# Patient Record
Sex: Male | Born: 1935
Health system: Southern US, Community
[De-identification: ages and names within clinical notes are randomized; demographics above are authoritative.]

## PROBLEM LIST (undated history)

## (undated) DIAGNOSIS — I4819 Other persistent atrial fibrillation: Secondary | ICD-10-CM

## (undated) DIAGNOSIS — F32A Depression, unspecified: Secondary | ICD-10-CM

## (undated) DIAGNOSIS — Z860101 Personal history of adenomatous and serrated colon polyps: Secondary | ICD-10-CM

## (undated) DIAGNOSIS — I5030 Unspecified diastolic (congestive) heart failure: Secondary | ICD-10-CM

## (undated) DIAGNOSIS — F329 Major depressive disorder, single episode, unspecified: Secondary | ICD-10-CM

## (undated) DIAGNOSIS — C4491 Basal cell carcinoma of skin, unspecified: Secondary | ICD-10-CM

## (undated) DIAGNOSIS — F419 Anxiety disorder, unspecified: Secondary | ICD-10-CM

## (undated) DIAGNOSIS — Z8639 Personal history of other endocrine, nutritional and metabolic disease: Secondary | ICD-10-CM

## (undated) DIAGNOSIS — L280 Lichen simplex chronicus: Secondary | ICD-10-CM

## (undated) DIAGNOSIS — N2889 Other specified disorders of kidney and ureter: Secondary | ICD-10-CM

## (undated) DIAGNOSIS — I2510 Atherosclerotic heart disease of native coronary artery without angina pectoris: Secondary | ICD-10-CM

## (undated) DIAGNOSIS — D649 Anemia, unspecified: Secondary | ICD-10-CM

## (undated) DIAGNOSIS — N319 Neuromuscular dysfunction of bladder, unspecified: Secondary | ICD-10-CM

## (undated) DIAGNOSIS — N189 Chronic kidney disease, unspecified: Secondary | ICD-10-CM

## (undated) DIAGNOSIS — N289 Disorder of kidney and ureter, unspecified: Secondary | ICD-10-CM

## (undated) DIAGNOSIS — K5792 Diverticulitis of intestine, part unspecified, without perforation or abscess without bleeding: Secondary | ICD-10-CM

## (undated) DIAGNOSIS — E871 Hypo-osmolality and hyponatremia: Secondary | ICD-10-CM

## (undated) DIAGNOSIS — I10 Essential (primary) hypertension: Secondary | ICD-10-CM

## (undated) DIAGNOSIS — C61 Malignant neoplasm of prostate: Secondary | ICD-10-CM

## (undated) DIAGNOSIS — C801 Malignant (primary) neoplasm, unspecified: Secondary | ICD-10-CM

## (undated) DIAGNOSIS — N529 Male erectile dysfunction, unspecified: Secondary | ICD-10-CM

## (undated) DIAGNOSIS — K219 Gastro-esophageal reflux disease without esophagitis: Secondary | ICD-10-CM

## (undated) DIAGNOSIS — I499 Cardiac arrhythmia, unspecified: Secondary | ICD-10-CM

## (undated) DIAGNOSIS — K589 Irritable bowel syndrome without diarrhea: Secondary | ICD-10-CM

## (undated) DIAGNOSIS — G4733 Obstructive sleep apnea (adult) (pediatric): Secondary | ICD-10-CM

## (undated) DIAGNOSIS — I509 Heart failure, unspecified: Secondary | ICD-10-CM

## (undated) DIAGNOSIS — M1990 Unspecified osteoarthritis, unspecified site: Secondary | ICD-10-CM

## (undated) DIAGNOSIS — M359 Systemic involvement of connective tissue, unspecified: Secondary | ICD-10-CM

## (undated) DIAGNOSIS — T7840XA Allergy, unspecified, initial encounter: Secondary | ICD-10-CM

## (undated) DIAGNOSIS — Z86010 Personal history of colonic polyps: Secondary | ICD-10-CM

## (undated) HISTORY — DX: Disorder of kidney and ureter, unspecified: N28.9

## (undated) HISTORY — DX: Irritable bowel syndrome, unspecified: K58.9

## (undated) HISTORY — DX: Diverticulitis of intestine, part unspecified, without perforation or abscess without bleeding: K57.92

## (undated) HISTORY — DX: Unspecified diastolic (congestive) heart failure: I50.30

## (undated) HISTORY — DX: Other persistent atrial fibrillation: I48.19

## (undated) HISTORY — DX: Depression, unspecified: F32.A

## (undated) HISTORY — DX: Lichen simplex chronicus: L28.0

## (undated) HISTORY — DX: Obstructive sleep apnea (adult) (pediatric): G47.33

## (undated) HISTORY — DX: Other specified disorders of kidney and ureter: N28.89

## (undated) HISTORY — DX: Major depressive disorder, single episode, unspecified: F32.9

## (undated) HISTORY — DX: Personal history of other endocrine, nutritional and metabolic disease: Z86.39

## (undated) HISTORY — DX: Essential (primary) hypertension: I10

## (undated) HISTORY — DX: Unspecified osteoarthritis, unspecified site: M19.90

## (undated) HISTORY — DX: Neuromuscular dysfunction of bladder, unspecified: N31.9

## (undated) HISTORY — DX: Malignant (primary) neoplasm, unspecified: C80.1

## (undated) HISTORY — DX: Basal cell carcinoma of skin, unspecified: C44.91

## (undated) HISTORY — PX: PROSTATE SURGERY: SHX751

## (undated) HISTORY — PX: OTHER SURGICAL HISTORY: SHX169

## (undated) HISTORY — DX: Atherosclerotic heart disease of native coronary artery without angina pectoris: I25.10

## (undated) HISTORY — DX: Allergy, unspecified, initial encounter: T78.40XA

## (undated) HISTORY — DX: Personal history of colonic polyps: Z86.010

## (undated) HISTORY — DX: Hypo-osmolality and hyponatremia: E87.1

## (undated) HISTORY — DX: Personal history of adenomatous and serrated colon polyps: Z86.0101

## (undated) HISTORY — DX: Male erectile dysfunction, unspecified: N52.9

## (undated) HISTORY — DX: Anxiety disorder, unspecified: F41.9

## (undated) HISTORY — DX: Gastro-esophageal reflux disease without esophagitis: K21.9

---

## 1991-11-29 DIAGNOSIS — C61 Malignant neoplasm of prostate: Secondary | ICD-10-CM | POA: Insufficient documentation

## 2003-10-28 ENCOUNTER — Encounter: Payer: Self-pay | Admitting: Gastroenterology

## 2004-10-20 ENCOUNTER — Ambulatory Visit: Payer: Self-pay | Admitting: Internal Medicine

## 2004-11-10 ENCOUNTER — Ambulatory Visit: Payer: Self-pay | Admitting: Internal Medicine

## 2005-02-24 ENCOUNTER — Ambulatory Visit: Payer: Self-pay | Admitting: Internal Medicine

## 2005-04-14 ENCOUNTER — Ambulatory Visit: Payer: Self-pay | Admitting: Internal Medicine

## 2005-04-27 ENCOUNTER — Ambulatory Visit: Payer: Self-pay | Admitting: Internal Medicine

## 2005-05-20 ENCOUNTER — Ambulatory Visit: Payer: Self-pay | Admitting: Internal Medicine

## 2005-08-11 ENCOUNTER — Ambulatory Visit: Payer: Self-pay | Admitting: Internal Medicine

## 2005-09-01 ENCOUNTER — Ambulatory Visit: Payer: Self-pay | Admitting: Dermatology

## 2005-09-07 ENCOUNTER — Ambulatory Visit: Payer: Self-pay | Admitting: Internal Medicine

## 2005-10-24 ENCOUNTER — Ambulatory Visit: Payer: Self-pay | Admitting: Internal Medicine

## 2005-11-09 ENCOUNTER — Ambulatory Visit: Payer: Self-pay | Admitting: Internal Medicine

## 2005-11-11 ENCOUNTER — Ambulatory Visit: Payer: Self-pay | Admitting: Cardiology

## 2006-01-30 ENCOUNTER — Ambulatory Visit: Payer: Self-pay | Admitting: Internal Medicine

## 2006-02-08 ENCOUNTER — Ambulatory Visit: Payer: Self-pay | Admitting: Internal Medicine

## 2006-03-27 ENCOUNTER — Ambulatory Visit: Payer: Self-pay | Admitting: Internal Medicine

## 2006-04-04 ENCOUNTER — Ambulatory Visit: Payer: Self-pay | Admitting: Urology

## 2006-05-03 ENCOUNTER — Ambulatory Visit: Payer: Self-pay | Admitting: Internal Medicine

## 2006-06-01 ENCOUNTER — Ambulatory Visit: Payer: Self-pay | Admitting: Family Medicine

## 2006-06-06 ENCOUNTER — Ambulatory Visit: Payer: Self-pay | Admitting: Internal Medicine

## 2006-06-22 ENCOUNTER — Ambulatory Visit: Payer: Self-pay | Admitting: Internal Medicine

## 2006-07-05 ENCOUNTER — Ambulatory Visit: Payer: Self-pay | Admitting: Psychology

## 2006-07-17 ENCOUNTER — Ambulatory Visit: Payer: Self-pay | Admitting: Psychology

## 2006-07-19 ENCOUNTER — Ambulatory Visit: Payer: Self-pay

## 2006-07-19 ENCOUNTER — Encounter: Payer: Self-pay | Admitting: Internal Medicine

## 2006-08-03 ENCOUNTER — Ambulatory Visit: Payer: Self-pay | Admitting: Internal Medicine

## 2006-08-04 ENCOUNTER — Ambulatory Visit: Payer: Self-pay

## 2006-08-15 ENCOUNTER — Ambulatory Visit: Payer: Self-pay | Admitting: Internal Medicine

## 2006-08-18 ENCOUNTER — Ambulatory Visit: Payer: Self-pay | Admitting: Internal Medicine

## 2006-08-22 ENCOUNTER — Ambulatory Visit: Payer: Self-pay | Admitting: Internal Medicine

## 2006-08-30 ENCOUNTER — Ambulatory Visit: Payer: Self-pay | Admitting: Internal Medicine

## 2006-09-05 ENCOUNTER — Ambulatory Visit: Payer: Self-pay | Admitting: Psychology

## 2006-09-13 ENCOUNTER — Ambulatory Visit: Payer: Self-pay | Admitting: Internal Medicine

## 2006-09-18 ENCOUNTER — Ambulatory Visit: Payer: Self-pay | Admitting: Gastroenterology

## 2006-09-20 ENCOUNTER — Encounter: Payer: Self-pay | Admitting: Gastroenterology

## 2006-09-20 ENCOUNTER — Ambulatory Visit: Payer: Self-pay | Admitting: *Deleted

## 2006-09-22 ENCOUNTER — Ambulatory Visit: Payer: Self-pay | Admitting: Internal Medicine

## 2006-09-29 ENCOUNTER — Ambulatory Visit: Payer: Self-pay | Admitting: Gastroenterology

## 2006-10-05 ENCOUNTER — Ambulatory Visit: Payer: Self-pay | Admitting: Gastroenterology

## 2006-10-05 LAB — CONVERTED CEMR LAB
Alkaline Phosphatase: 44 units/L (ref 39–117)
BUN: 6 mg/dL (ref 6–23)
CO2: 31 meq/L (ref 19–32)
Calcium: 9.2 mg/dL (ref 8.4–10.5)
Creatinine, Ser: 0.7 mg/dL (ref 0.4–1.5)
Eosinophil percent: 2.5 % (ref 0.0–5.0)
GFR calc non Af Amer: 118 mL/min
Glomerular Filtration Rate, Af Am: 143 mL/min/{1.73_m2}
Hemoglobin: 15.7 g/dL (ref 13.0–17.0)
Lipase: 40 units/L (ref 11.0–59.0)
Lymphocytes Relative: 12.5 % (ref 12.0–46.0)
Monocytes Absolute: 0.5 10*3/uL (ref 0.2–0.7)
Monocytes Relative: 7.4 % (ref 3.0–11.0)
Neutro Abs: 5.4 10*3/uL (ref 1.4–7.7)
Potassium: 4.6 meq/L (ref 3.5–5.1)
Total Protein: 6.4 g/dL (ref 6.0–8.3)

## 2006-10-12 ENCOUNTER — Ambulatory Visit: Payer: Self-pay | Admitting: Internal Medicine

## 2006-10-13 ENCOUNTER — Ambulatory Visit: Payer: Self-pay | Admitting: Psychology

## 2006-10-17 ENCOUNTER — Ambulatory Visit: Payer: Self-pay | Admitting: Internal Medicine

## 2006-10-26 ENCOUNTER — Ambulatory Visit: Payer: Self-pay | Admitting: Internal Medicine

## 2006-10-31 ENCOUNTER — Ambulatory Visit: Payer: Self-pay | Admitting: Psychology

## 2006-11-13 ENCOUNTER — Ambulatory Visit: Payer: Self-pay | Admitting: Internal Medicine

## 2006-11-14 ENCOUNTER — Ambulatory Visit: Payer: Self-pay | Admitting: Psychology

## 2006-12-12 ENCOUNTER — Ambulatory Visit: Payer: Self-pay | Admitting: Psychology

## 2007-01-09 ENCOUNTER — Ambulatory Visit: Payer: Self-pay | Admitting: Psychology

## 2007-01-10 ENCOUNTER — Ambulatory Visit: Payer: Self-pay | Admitting: Internal Medicine

## 2007-01-27 DIAGNOSIS — M0579 Rheumatoid arthritis with rheumatoid factor of multiple sites without organ or systems involvement: Secondary | ICD-10-CM | POA: Insufficient documentation

## 2007-01-27 DIAGNOSIS — F411 Generalized anxiety disorder: Secondary | ICD-10-CM

## 2007-01-27 DIAGNOSIS — F419 Anxiety disorder, unspecified: Secondary | ICD-10-CM | POA: Insufficient documentation

## 2007-01-27 DIAGNOSIS — J45909 Unspecified asthma, uncomplicated: Secondary | ICD-10-CM

## 2007-01-27 DIAGNOSIS — K219 Gastro-esophageal reflux disease without esophagitis: Secondary | ICD-10-CM | POA: Insufficient documentation

## 2007-01-27 DIAGNOSIS — N529 Male erectile dysfunction, unspecified: Secondary | ICD-10-CM | POA: Insufficient documentation

## 2007-01-27 DIAGNOSIS — M069 Rheumatoid arthritis, unspecified: Secondary | ICD-10-CM | POA: Insufficient documentation

## 2007-01-27 DIAGNOSIS — K5730 Diverticulosis of large intestine without perforation or abscess without bleeding: Secondary | ICD-10-CM | POA: Insufficient documentation

## 2007-01-27 DIAGNOSIS — I2510 Atherosclerotic heart disease of native coronary artery without angina pectoris: Secondary | ICD-10-CM

## 2007-01-27 DIAGNOSIS — L280 Lichen simplex chronicus: Secondary | ICD-10-CM | POA: Insufficient documentation

## 2007-01-30 ENCOUNTER — Ambulatory Visit: Payer: Self-pay | Admitting: Internal Medicine

## 2007-01-30 LAB — CONVERTED CEMR LAB
BUN: 13 mg/dL (ref 6–23)
Basophils Relative: 0.2 % (ref 0.0–1.0)
Bilirubin, Direct: 0.1 mg/dL (ref 0.0–0.3)
Calcium: 8.9 mg/dL (ref 8.4–10.5)
Eosinophils Relative: 6.2 % — ABNORMAL HIGH (ref 0.0–5.0)
GFR calc Af Amer: 143 mL/min
GFR calc non Af Amer: 118 mL/min
Glucose, Bld: 91 mg/dL (ref 70–99)
Hemoglobin: 15.5 g/dL (ref 13.0–17.0)
Lymphocytes Relative: 22.8 % (ref 12.0–46.0)
MCV: 94.1 fL (ref 78.0–100.0)
Monocytes Absolute: 0.6 10*3/uL (ref 0.2–0.7)
Neutro Abs: 3.4 10*3/uL (ref 1.4–7.7)
Neutrophils Relative %: 61.1 % (ref 43.0–77.0)
Potassium: 4.4 meq/L (ref 3.5–5.1)
Total Bilirubin: 0.9 mg/dL (ref 0.3–1.2)
Total Protein: 6.4 g/dL (ref 6.0–8.3)
VLDL: 15 mg/dL (ref 0–40)
WBC: 5.7 10*3/uL (ref 4.5–10.5)

## 2007-02-21 ENCOUNTER — Encounter: Payer: Self-pay | Admitting: Internal Medicine

## 2007-02-23 ENCOUNTER — Encounter: Payer: Self-pay | Admitting: Internal Medicine

## 2007-03-06 ENCOUNTER — Ambulatory Visit: Payer: Self-pay | Admitting: Psychology

## 2007-05-01 ENCOUNTER — Ambulatory Visit: Payer: Self-pay | Admitting: Psychology

## 2007-06-05 ENCOUNTER — Ambulatory Visit: Payer: Self-pay | Admitting: Internal Medicine

## 2007-06-13 ENCOUNTER — Ambulatory Visit: Payer: Self-pay | Admitting: Internal Medicine

## 2007-07-09 ENCOUNTER — Telehealth (INDEPENDENT_AMBULATORY_CARE_PROVIDER_SITE_OTHER): Payer: Self-pay | Admitting: *Deleted

## 2007-07-10 ENCOUNTER — Telehealth (INDEPENDENT_AMBULATORY_CARE_PROVIDER_SITE_OTHER): Payer: Self-pay | Admitting: *Deleted

## 2007-09-07 ENCOUNTER — Ambulatory Visit: Payer: Self-pay | Admitting: Internal Medicine

## 2007-09-19 ENCOUNTER — Ambulatory Visit: Payer: Self-pay | Admitting: Internal Medicine

## 2007-09-20 LAB — CONVERTED CEMR LAB
ALT: 28 units/L (ref 0–53)
Alkaline Phosphatase: 46 units/L (ref 39–117)
Basophils Absolute: 0 10*3/uL (ref 0.0–0.1)
Chloride: 102 meq/L (ref 96–112)
Creatinine, Ser: 0.7 mg/dL (ref 0.4–1.5)
Eosinophils Absolute: 0.3 10*3/uL (ref 0.0–0.6)
Eosinophils Relative: 5.6 % — ABNORMAL HIGH (ref 0.0–5.0)
Glucose, Bld: 100 mg/dL — ABNORMAL HIGH (ref 70–99)
MCV: 94.4 fL (ref 78.0–100.0)
Platelets: 147 10*3/uL — ABNORMAL LOW (ref 150–400)
RBC: 4.6 M/uL (ref 4.22–5.81)
Sodium: 138 meq/L (ref 135–145)
Total Protein: 6.3 g/dL (ref 6.0–8.3)
WBC: 5.7 10*3/uL (ref 4.5–10.5)

## 2007-10-10 ENCOUNTER — Ambulatory Visit: Payer: Self-pay | Admitting: Internal Medicine

## 2007-10-23 ENCOUNTER — Ambulatory Visit: Payer: Self-pay | Admitting: Internal Medicine

## 2007-10-24 ENCOUNTER — Telehealth (INDEPENDENT_AMBULATORY_CARE_PROVIDER_SITE_OTHER): Payer: Self-pay | Admitting: Internal Medicine

## 2007-10-30 ENCOUNTER — Telehealth (INDEPENDENT_AMBULATORY_CARE_PROVIDER_SITE_OTHER): Payer: Self-pay | Admitting: *Deleted

## 2007-10-31 ENCOUNTER — Telehealth (INDEPENDENT_AMBULATORY_CARE_PROVIDER_SITE_OTHER): Payer: Self-pay | Admitting: *Deleted

## 2007-11-02 ENCOUNTER — Ambulatory Visit: Payer: Self-pay | Admitting: Internal Medicine

## 2007-11-08 ENCOUNTER — Ambulatory Visit: Payer: Self-pay | Admitting: Internal Medicine

## 2007-11-29 HISTORY — PX: NASAL SINUS SURGERY: SHX719

## 2007-12-03 ENCOUNTER — Other Ambulatory Visit: Payer: Self-pay

## 2007-12-03 ENCOUNTER — Inpatient Hospital Stay: Payer: Self-pay | Admitting: *Deleted

## 2007-12-06 ENCOUNTER — Encounter: Payer: Self-pay | Admitting: Internal Medicine

## 2007-12-10 ENCOUNTER — Ambulatory Visit: Payer: Self-pay | Admitting: Internal Medicine

## 2007-12-19 ENCOUNTER — Telehealth (INDEPENDENT_AMBULATORY_CARE_PROVIDER_SITE_OTHER): Payer: Self-pay | Admitting: *Deleted

## 2007-12-24 ENCOUNTER — Ambulatory Visit: Payer: Self-pay | Admitting: Internal Medicine

## 2007-12-24 ENCOUNTER — Telehealth (INDEPENDENT_AMBULATORY_CARE_PROVIDER_SITE_OTHER): Payer: Self-pay | Admitting: *Deleted

## 2008-01-04 ENCOUNTER — Telehealth (INDEPENDENT_AMBULATORY_CARE_PROVIDER_SITE_OTHER): Payer: Self-pay | Admitting: *Deleted

## 2008-01-11 ENCOUNTER — Ambulatory Visit: Payer: Self-pay | Admitting: Internal Medicine

## 2008-01-29 ENCOUNTER — Telehealth (INDEPENDENT_AMBULATORY_CARE_PROVIDER_SITE_OTHER): Payer: Self-pay | Admitting: *Deleted

## 2008-02-15 ENCOUNTER — Ambulatory Visit: Payer: Self-pay | Admitting: Internal Medicine

## 2008-02-15 DIAGNOSIS — M1710 Unilateral primary osteoarthritis, unspecified knee: Secondary | ICD-10-CM | POA: Insufficient documentation

## 2008-02-20 ENCOUNTER — Ambulatory Visit: Payer: Self-pay | Admitting: Internal Medicine

## 2008-02-21 LAB — CONVERTED CEMR LAB
AST: 20 units/L (ref 0–37)
Alkaline Phosphatase: 47 units/L (ref 39–117)
Basophils Absolute: 0 10*3/uL (ref 0.0–0.1)
CO2: 30 meq/L (ref 19–32)
Calcium: 9 mg/dL (ref 8.4–10.5)
Chloride: 102 meq/L (ref 96–112)
Cholesterol: 158 mg/dL (ref 0–200)
Creatinine, Ser: 0.7 mg/dL (ref 0.4–1.5)
Glucose, Bld: 93 mg/dL (ref 70–99)
HDL: 46 mg/dL (ref >39.0)
LDL Cholesterol: 89 mg/dL (ref 0–99)
Lymphocytes Relative: 22.6 % (ref 12.0–46.0)
MCHC: 33.5 g/dL (ref 30.0–36.0)
Neutrophils Relative %: 64.4 % (ref 43.0–77.0)
PSA: 0.01 ng/mL — ABNORMAL LOW (ref 0.10–4.00)
RDW: 11.8 % (ref 11.5–14.6)
Sodium: 135 meq/L (ref 135–145)
Total Bilirubin: 1.1 mg/dL (ref 0.3–1.2)
Triglycerides: 115 mg/dL (ref 0–149)
VLDL: 23 mg/dL (ref 0–40)

## 2008-04-09 ENCOUNTER — Ambulatory Visit: Payer: Self-pay | Admitting: Internal Medicine

## 2008-04-24 ENCOUNTER — Telehealth: Payer: Self-pay | Admitting: Internal Medicine

## 2008-05-01 ENCOUNTER — Ambulatory Visit: Payer: Self-pay | Admitting: Internal Medicine

## 2008-05-02 ENCOUNTER — Encounter: Payer: Self-pay | Admitting: Internal Medicine

## 2008-05-02 ENCOUNTER — Emergency Department: Payer: Self-pay | Admitting: Emergency Medicine

## 2008-05-02 ENCOUNTER — Telehealth (INDEPENDENT_AMBULATORY_CARE_PROVIDER_SITE_OTHER): Payer: Self-pay | Admitting: *Deleted

## 2008-05-05 ENCOUNTER — Ambulatory Visit: Payer: Self-pay | Admitting: Internal Medicine

## 2008-05-05 ENCOUNTER — Telehealth (INDEPENDENT_AMBULATORY_CARE_PROVIDER_SITE_OTHER): Payer: Self-pay | Admitting: *Deleted

## 2008-05-06 ENCOUNTER — Telehealth: Payer: Self-pay | Admitting: Internal Medicine

## 2008-05-06 ENCOUNTER — Encounter (INDEPENDENT_AMBULATORY_CARE_PROVIDER_SITE_OTHER): Payer: Self-pay | Admitting: *Deleted

## 2008-05-08 ENCOUNTER — Telehealth: Payer: Self-pay | Admitting: Family Medicine

## 2008-05-08 ENCOUNTER — Encounter (INDEPENDENT_AMBULATORY_CARE_PROVIDER_SITE_OTHER): Payer: Self-pay | Admitting: *Deleted

## 2008-05-12 ENCOUNTER — Ambulatory Visit: Payer: Self-pay | Admitting: Pulmonary Disease

## 2008-05-19 ENCOUNTER — Telehealth: Payer: Self-pay | Admitting: Internal Medicine

## 2008-05-28 ENCOUNTER — Telehealth: Payer: Self-pay | Admitting: Family Medicine

## 2008-06-02 ENCOUNTER — Ambulatory Visit: Payer: Self-pay | Admitting: Internal Medicine

## 2008-06-05 ENCOUNTER — Encounter: Payer: Self-pay | Admitting: Pulmonary Disease

## 2008-06-05 ENCOUNTER — Ambulatory Visit: Payer: Self-pay | Admitting: Pulmonary Disease

## 2008-06-09 ENCOUNTER — Encounter: Payer: Self-pay | Admitting: Pulmonary Disease

## 2008-06-12 ENCOUNTER — Encounter: Payer: Self-pay | Admitting: Internal Medicine

## 2008-06-16 ENCOUNTER — Ambulatory Visit: Payer: Self-pay | Admitting: Internal Medicine

## 2008-06-30 ENCOUNTER — Telehealth (INDEPENDENT_AMBULATORY_CARE_PROVIDER_SITE_OTHER): Payer: Self-pay | Admitting: *Deleted

## 2008-07-08 ENCOUNTER — Ambulatory Visit: Payer: Self-pay | Admitting: Unknown Physician Specialty

## 2008-07-24 ENCOUNTER — Encounter: Payer: Self-pay | Admitting: Internal Medicine

## 2008-08-06 ENCOUNTER — Ambulatory Visit: Payer: Self-pay | Admitting: Internal Medicine

## 2008-08-08 ENCOUNTER — Telehealth: Payer: Self-pay | Admitting: Internal Medicine

## 2008-08-21 ENCOUNTER — Ambulatory Visit: Payer: Self-pay | Admitting: Internal Medicine

## 2008-08-22 ENCOUNTER — Ambulatory Visit: Payer: Self-pay | Admitting: Internal Medicine

## 2008-08-22 ENCOUNTER — Telehealth: Payer: Self-pay | Admitting: Internal Medicine

## 2008-09-02 ENCOUNTER — Ambulatory Visit: Payer: Self-pay | Admitting: Pulmonary Disease

## 2008-09-10 ENCOUNTER — Ambulatory Visit: Payer: Self-pay | Admitting: Internal Medicine

## 2008-09-11 ENCOUNTER — Telehealth: Payer: Self-pay | Admitting: Internal Medicine

## 2008-10-10 ENCOUNTER — Telehealth: Payer: Self-pay | Admitting: Internal Medicine

## 2008-10-13 ENCOUNTER — Telehealth (INDEPENDENT_AMBULATORY_CARE_PROVIDER_SITE_OTHER): Payer: Self-pay | Admitting: *Deleted

## 2008-10-15 ENCOUNTER — Ambulatory Visit: Payer: Self-pay | Admitting: Internal Medicine

## 2008-11-11 ENCOUNTER — Ambulatory Visit: Payer: Self-pay | Admitting: Internal Medicine

## 2008-11-17 ENCOUNTER — Ambulatory Visit: Payer: Self-pay | Admitting: Internal Medicine

## 2008-11-17 LAB — CONVERTED CEMR LAB
BUN: 9 mg/dL (ref 6–23)
Basophils Absolute: 0 10*3/uL (ref 0.0–0.1)
Bilirubin, Direct: 0.1 mg/dL (ref 0.0–0.3)
CO2: 30 meq/L (ref 19–32)
Chloride: 97 meq/L (ref 96–112)
Cholesterol: 172 mg/dL (ref 0–200)
Eosinophils Absolute: 0.8 10*3/uL — ABNORMAL HIGH (ref 0.0–0.7)
Glucose, Bld: 95 mg/dL (ref 70–99)
HCT: 40.7 % (ref 39.0–52.0)
HDL: 65.8 mg/dL (ref >39.0)
MCHC: 35.3 g/dL (ref 30.0–36.0)
MCV: 91.8 fL (ref 78.0–100.0)
Monocytes Absolute: 0.7 10*3/uL (ref 0.1–1.0)
Monocytes Relative: 8.7 % (ref 3.0–12.0)
Neutro Abs: 5.6 10*3/uL (ref 1.4–7.7)
PSA: 0.04 ng/mL — ABNORMAL LOW (ref 0.10–4.00)
Platelets: 191 10*3/uL (ref 150–400)
Potassium: 4.7 meq/L (ref 3.5–5.1)
RDW: 13.1 % (ref 11.5–14.6)
Sodium: 134 meq/L — ABNORMAL LOW (ref 135–145)
Total Bilirubin: 1.1 mg/dL (ref 0.3–1.2)
Total CHOL/HDL Ratio: 2.6
Triglycerides: 75 mg/dL (ref 0–149)

## 2008-11-25 ENCOUNTER — Telehealth: Payer: Self-pay | Admitting: Internal Medicine

## 2008-12-03 ENCOUNTER — Emergency Department: Payer: Self-pay | Admitting: Emergency Medicine

## 2008-12-03 ENCOUNTER — Encounter: Payer: Self-pay | Admitting: Pulmonary Disease

## 2008-12-09 ENCOUNTER — Ambulatory Visit: Payer: Self-pay | Admitting: Pulmonary Disease

## 2008-12-09 DIAGNOSIS — J309 Allergic rhinitis, unspecified: Secondary | ICD-10-CM | POA: Insufficient documentation

## 2008-12-26 ENCOUNTER — Telehealth: Payer: Self-pay | Admitting: Internal Medicine

## 2008-12-30 ENCOUNTER — Ambulatory Visit: Payer: Self-pay | Admitting: Internal Medicine

## 2008-12-31 ENCOUNTER — Telehealth: Payer: Self-pay | Admitting: Internal Medicine

## 2009-01-07 ENCOUNTER — Telehealth: Payer: Self-pay | Admitting: Internal Medicine

## 2009-01-08 ENCOUNTER — Ambulatory Visit: Payer: Self-pay | Admitting: Internal Medicine

## 2009-01-20 ENCOUNTER — Ambulatory Visit: Payer: Self-pay | Admitting: Pulmonary Disease

## 2009-01-27 ENCOUNTER — Telehealth (INDEPENDENT_AMBULATORY_CARE_PROVIDER_SITE_OTHER): Payer: Self-pay | Admitting: *Deleted

## 2009-02-12 ENCOUNTER — Ambulatory Visit: Payer: Self-pay | Admitting: Internal Medicine

## 2009-03-02 ENCOUNTER — Inpatient Hospital Stay: Payer: Self-pay | Admitting: Internal Medicine

## 2009-03-02 ENCOUNTER — Encounter: Payer: Self-pay | Admitting: Pulmonary Disease

## 2009-03-03 ENCOUNTER — Telehealth: Payer: Self-pay | Admitting: Internal Medicine

## 2009-03-04 ENCOUNTER — Encounter: Payer: Self-pay | Admitting: Internal Medicine

## 2009-03-06 ENCOUNTER — Ambulatory Visit: Payer: Self-pay | Admitting: Internal Medicine

## 2009-03-16 ENCOUNTER — Ambulatory Visit: Payer: Self-pay | Admitting: Internal Medicine

## 2009-03-18 ENCOUNTER — Encounter: Payer: Self-pay | Admitting: Internal Medicine

## 2009-03-19 LAB — CONVERTED CEMR LAB
ALT: 27 units/L (ref 0–53)
AST: 29 units/L (ref 0–37)
Basophils Relative: 0.1 % (ref 0.0–3.0)
Bilirubin, Direct: 0.1 mg/dL (ref 0.0–0.3)
CO2: 32 meq/L (ref 19–32)
Chloride: 101 meq/L (ref 96–112)
Eosinophils Relative: 7.2 % — ABNORMAL HIGH (ref 0.0–5.0)
HCT: 41.5 % (ref 39.0–52.0)
LDL Cholesterol: 86 mg/dL (ref 0–99)
Lymphs Abs: 1.5 10*3/uL (ref 0.7–4.0)
MCV: 91.9 fL (ref 78.0–100.0)
Monocytes Absolute: 0.6 10*3/uL (ref 0.1–1.0)
Monocytes Relative: 6.6 % (ref 3.0–12.0)
Neutrophils Relative %: 69.9 % (ref 43.0–77.0)
PSA: 0.03 ng/mL — ABNORMAL LOW (ref 0.10–4.00)
Phosphorus: 3.9 mg/dL (ref 2.3–4.6)
Platelets: 176 10*3/uL (ref 150.0–400.0)
RBC: 4.51 M/uL (ref 4.22–5.81)
Sodium: 136 meq/L (ref 135–145)
Total Bilirubin: 0.7 mg/dL (ref 0.3–1.2)
Total CHOL/HDL Ratio: 3
Total Protein: 6.4 g/dL (ref 6.0–8.3)
Triglycerides: 55 mg/dL (ref 0.0–149.0)
WBC: 9.5 10*3/uL (ref 4.5–10.5)

## 2009-03-21 ENCOUNTER — Inpatient Hospital Stay: Payer: Self-pay | Admitting: Internal Medicine

## 2009-03-21 ENCOUNTER — Ambulatory Visit: Payer: Self-pay | Admitting: Cardiology

## 2009-03-21 ENCOUNTER — Encounter: Payer: Self-pay | Admitting: Pulmonary Disease

## 2009-03-21 ENCOUNTER — Encounter: Payer: Self-pay | Admitting: Internal Medicine

## 2009-03-23 ENCOUNTER — Encounter: Payer: Self-pay | Admitting: Internal Medicine

## 2009-03-25 ENCOUNTER — Ambulatory Visit: Payer: Self-pay | Admitting: Internal Medicine

## 2009-03-25 DIAGNOSIS — I10 Essential (primary) hypertension: Secondary | ICD-10-CM | POA: Insufficient documentation

## 2009-04-06 ENCOUNTER — Telehealth: Payer: Self-pay | Admitting: Internal Medicine

## 2009-04-13 ENCOUNTER — Encounter: Payer: Self-pay | Admitting: Cardiovascular Disease

## 2009-04-13 ENCOUNTER — Inpatient Hospital Stay: Payer: Self-pay | Admitting: Internal Medicine

## 2009-04-13 ENCOUNTER — Encounter: Payer: Self-pay | Admitting: Internal Medicine

## 2009-04-13 ENCOUNTER — Encounter: Payer: Self-pay | Admitting: Pulmonary Disease

## 2009-04-15 ENCOUNTER — Telehealth: Payer: Self-pay | Admitting: Internal Medicine

## 2009-04-16 ENCOUNTER — Encounter: Payer: Self-pay | Admitting: Pulmonary Disease

## 2009-04-21 ENCOUNTER — Encounter: Payer: Self-pay | Admitting: Internal Medicine

## 2009-04-21 ENCOUNTER — Telehealth (INDEPENDENT_AMBULATORY_CARE_PROVIDER_SITE_OTHER): Payer: Self-pay | Admitting: *Deleted

## 2009-04-23 ENCOUNTER — Ambulatory Visit: Payer: Self-pay | Admitting: Internal Medicine

## 2009-04-23 ENCOUNTER — Ambulatory Visit: Payer: Self-pay | Admitting: Pulmonary Disease

## 2009-04-24 ENCOUNTER — Ambulatory Visit: Payer: Self-pay | Admitting: Internal Medicine

## 2009-04-24 LAB — CONVERTED CEMR LAB
CO2: 29 meq/L (ref 19–32)
Chloride: 100 meq/L (ref 96–112)
Phosphorus: 3.5 mg/dL (ref 2.3–4.6)
Potassium: 3.8 meq/L (ref 3.5–5.1)
Sodium: 133 meq/L — ABNORMAL LOW (ref 135–145)

## 2009-04-28 ENCOUNTER — Telehealth: Payer: Self-pay | Admitting: Internal Medicine

## 2009-05-04 ENCOUNTER — Encounter: Payer: Self-pay | Admitting: Cardiovascular Disease

## 2009-05-04 ENCOUNTER — Ambulatory Visit: Payer: Self-pay | Admitting: Cardiovascular Disease

## 2009-05-04 DIAGNOSIS — I509 Heart failure, unspecified: Secondary | ICD-10-CM

## 2009-05-04 DIAGNOSIS — I110 Hypertensive heart disease with heart failure: Secondary | ICD-10-CM | POA: Insufficient documentation

## 2009-05-07 ENCOUNTER — Ambulatory Visit: Payer: Self-pay | Admitting: Internal Medicine

## 2009-05-07 LAB — CONVERTED CEMR LAB
CO2: 32 meq/L (ref 19–32)
Calcium: 8.6 mg/dL (ref 8.4–10.5)
Chloride: 104 meq/L (ref 96–112)
Creatinine, Ser: 0.6 mg/dL (ref 0.4–1.5)
Potassium: 4.4 meq/L (ref 3.5–5.1)
Sodium: 138 meq/L (ref 135–145)

## 2009-05-09 ENCOUNTER — Encounter: Payer: Self-pay | Admitting: Pulmonary Disease

## 2009-05-09 ENCOUNTER — Ambulatory Visit (HOSPITAL_BASED_OUTPATIENT_CLINIC_OR_DEPARTMENT_OTHER): Admission: RE | Admit: 2009-05-09 | Discharge: 2009-05-09 | Payer: Self-pay | Admitting: Pulmonary Disease

## 2009-05-11 ENCOUNTER — Ambulatory Visit: Payer: Self-pay | Admitting: Internal Medicine

## 2009-05-12 ENCOUNTER — Ambulatory Visit: Payer: Self-pay | Admitting: Pulmonary Disease

## 2009-05-13 ENCOUNTER — Encounter: Payer: Self-pay | Admitting: Pulmonary Disease

## 2009-05-13 DIAGNOSIS — G4733 Obstructive sleep apnea (adult) (pediatric): Secondary | ICD-10-CM | POA: Insufficient documentation

## 2009-05-18 ENCOUNTER — Telehealth: Payer: Self-pay | Admitting: Internal Medicine

## 2009-05-19 ENCOUNTER — Encounter: Payer: Self-pay | Admitting: Pulmonary Disease

## 2009-05-19 ENCOUNTER — Ambulatory Visit (HOSPITAL_BASED_OUTPATIENT_CLINIC_OR_DEPARTMENT_OTHER): Admission: RE | Admit: 2009-05-19 | Discharge: 2009-05-19 | Payer: Self-pay | Admitting: Pulmonary Disease

## 2009-05-22 ENCOUNTER — Ambulatory Visit: Payer: Self-pay | Admitting: Internal Medicine

## 2009-06-09 ENCOUNTER — Telehealth: Payer: Self-pay | Admitting: Pulmonary Disease

## 2009-06-11 ENCOUNTER — Emergency Department: Payer: Self-pay | Admitting: Unknown Physician Specialty

## 2009-06-16 ENCOUNTER — Ambulatory Visit: Payer: Self-pay | Admitting: Internal Medicine

## 2009-06-17 ENCOUNTER — Encounter: Payer: Self-pay | Admitting: Pulmonary Disease

## 2009-06-28 ENCOUNTER — Encounter: Payer: Self-pay | Admitting: Pulmonary Disease

## 2009-07-01 ENCOUNTER — Ambulatory Visit: Payer: Self-pay | Admitting: Internal Medicine

## 2009-07-02 ENCOUNTER — Encounter: Payer: Self-pay | Admitting: Pulmonary Disease

## 2009-07-07 ENCOUNTER — Encounter: Payer: Self-pay | Admitting: Pulmonary Disease

## 2009-07-13 ENCOUNTER — Ambulatory Visit: Payer: Self-pay | Admitting: Internal Medicine

## 2009-07-16 ENCOUNTER — Ambulatory Visit: Payer: Self-pay | Admitting: Internal Medicine

## 2009-07-16 ENCOUNTER — Telehealth: Payer: Self-pay | Admitting: Internal Medicine

## 2009-07-17 ENCOUNTER — Encounter: Payer: Self-pay | Admitting: Pulmonary Disease

## 2009-07-23 ENCOUNTER — Ambulatory Visit: Payer: Self-pay | Admitting: Pulmonary Disease

## 2009-07-23 LAB — CONVERTED CEMR LAB
ALT: 23 units/L (ref 0–53)
AST: 21 units/L (ref 0–37)
Albumin: 3.6 g/dL (ref 3.5–5.2)
BUN: 13 mg/dL (ref 6–23)
Basophils Absolute: 0 10*3/uL (ref 0.0–0.1)
CO2: 30 meq/L (ref 19–32)
Chloride: 102 meq/L (ref 96–112)
GFR calc non Af Amer: 140.3 mL/min (ref >60)
Glucose, Bld: 121 mg/dL — ABNORMAL HIGH (ref 70–99)
HCT: 39.2 % (ref 39.0–52.0)
Hemoglobin: 13.5 g/dL (ref 13.0–17.0)
IgE (Immunoglobulin E), Serum: 527.7 intl units/mL — ABNORMAL HIGH (ref 0.0–180.0)
Lymphs Abs: 0.4 10*3/uL — ABNORMAL LOW (ref 0.7–4.0)
MCV: 94.6 fL (ref 78.0–100.0)
Monocytes Absolute: 0.1 10*3/uL (ref 0.1–1.0)
Monocytes Relative: 1.2 % — ABNORMAL LOW (ref 3.0–12.0)
Neutro Abs: 8.8 10*3/uL — ABNORMAL HIGH (ref 1.4–7.7)
Potassium: 4.3 meq/L (ref 3.5–5.1)
RDW: 13.2 % (ref 11.5–14.6)
Sodium: 137 meq/L (ref 135–145)

## 2009-07-24 ENCOUNTER — Ambulatory Visit: Payer: Self-pay | Admitting: Internal Medicine

## 2009-08-12 ENCOUNTER — Ambulatory Visit: Payer: Self-pay | Admitting: Internal Medicine

## 2009-08-13 ENCOUNTER — Ambulatory Visit: Payer: Self-pay | Admitting: Pulmonary Disease

## 2009-08-13 LAB — CONVERTED CEMR LAB
ALT: 24 units/L (ref 0–53)
AST: 20 units/L (ref 0–37)
Albumin: 3.5 g/dL (ref 3.5–5.2)
Alkaline Phosphatase: 51 units/L (ref 39–117)
BUN: 13 mg/dL (ref 6–23)
Basophils Absolute: 0 10*3/uL (ref 0.0–0.1)
Bilirubin, Direct: 0 mg/dL (ref 0.0–0.3)
Calcium: 8.8 mg/dL (ref 8.4–10.5)
Chloride: 103 meq/L (ref 96–112)
Eosinophils Absolute: 0.3 10*3/uL (ref 0.0–0.7)
Glucose, Bld: 113 mg/dL — ABNORMAL HIGH (ref 70–99)
HCT: 37.4 % — ABNORMAL LOW (ref 39.0–52.0)
HDL: 63.5 mg/dL (ref >39.00)
Hemoglobin: 12.9 g/dL — ABNORMAL LOW (ref 13.0–17.0)
Lymphs Abs: 0.7 10*3/uL (ref 0.7–4.0)
MCHC: 34.4 g/dL (ref 30.0–36.0)
Monocytes Relative: 4.6 % (ref 3.0–12.0)
Neutro Abs: 7.9 10*3/uL — ABNORMAL HIGH (ref 1.4–7.7)
Phosphorus: 3.3 mg/dL (ref 2.3–4.6)
Platelets: 164 10*3/uL (ref 150.0–400.0)
Potassium: 4.4 meq/L (ref 3.5–5.1)
RDW: 13.2 % (ref 11.5–14.6)
TSH: 1.31 microintl units/mL (ref 0.35–5.50)
Total Bilirubin: 0.8 mg/dL (ref 0.3–1.2)
Total CHOL/HDL Ratio: 3

## 2009-08-14 ENCOUNTER — Encounter: Payer: Self-pay | Admitting: Pulmonary Disease

## 2009-08-26 ENCOUNTER — Ambulatory Visit: Payer: Self-pay | Admitting: Internal Medicine

## 2009-08-27 ENCOUNTER — Encounter: Payer: Self-pay | Admitting: Pulmonary Disease

## 2009-09-01 ENCOUNTER — Ambulatory Visit: Payer: Self-pay | Admitting: Internal Medicine

## 2009-09-08 ENCOUNTER — Ambulatory Visit: Payer: Self-pay | Admitting: Pulmonary Disease

## 2009-09-14 ENCOUNTER — Ambulatory Visit: Payer: Self-pay | Admitting: Internal Medicine

## 2009-09-14 ENCOUNTER — Telehealth: Payer: Self-pay | Admitting: Internal Medicine

## 2009-09-15 ENCOUNTER — Telehealth: Payer: Self-pay | Admitting: Internal Medicine

## 2009-09-15 ENCOUNTER — Emergency Department: Payer: Self-pay | Admitting: Emergency Medicine

## 2009-09-16 ENCOUNTER — Ambulatory Visit: Payer: Self-pay | Admitting: Internal Medicine

## 2009-09-25 ENCOUNTER — Telehealth: Payer: Self-pay | Admitting: Internal Medicine

## 2009-09-28 ENCOUNTER — Ambulatory Visit: Payer: Self-pay | Admitting: Internal Medicine

## 2009-10-04 ENCOUNTER — Emergency Department: Payer: Self-pay | Admitting: Emergency Medicine

## 2009-10-05 ENCOUNTER — Telehealth: Payer: Self-pay | Admitting: Internal Medicine

## 2009-10-06 ENCOUNTER — Telehealth: Payer: Self-pay | Admitting: Pulmonary Disease

## 2009-10-08 ENCOUNTER — Ambulatory Visit: Payer: Self-pay | Admitting: Pulmonary Disease

## 2009-10-12 ENCOUNTER — Ambulatory Visit: Payer: Self-pay | Admitting: Internal Medicine

## 2009-10-21 ENCOUNTER — Ambulatory Visit: Payer: Self-pay | Admitting: Pulmonary Disease

## 2009-11-03 ENCOUNTER — Emergency Department: Payer: Self-pay | Admitting: Emergency Medicine

## 2009-11-04 ENCOUNTER — Ambulatory Visit: Payer: Self-pay | Admitting: Pulmonary Disease

## 2009-11-05 ENCOUNTER — Inpatient Hospital Stay: Payer: Self-pay | Admitting: Internal Medicine

## 2009-11-07 ENCOUNTER — Encounter: Payer: Self-pay | Admitting: Internal Medicine

## 2009-11-09 ENCOUNTER — Telehealth: Payer: Self-pay | Admitting: Pulmonary Disease

## 2009-11-11 ENCOUNTER — Ambulatory Visit: Payer: Self-pay | Admitting: Internal Medicine

## 2009-11-11 DIAGNOSIS — E871 Hypo-osmolality and hyponatremia: Secondary | ICD-10-CM | POA: Insufficient documentation

## 2009-11-13 LAB — CONVERTED CEMR LAB
Albumin: 3.2 g/dL — ABNORMAL LOW (ref 3.5–5.2)
BUN: 9 mg/dL (ref 6–23)
Calcium: 8.7 mg/dL (ref 8.4–10.5)
Creatinine, Ser: 0.6 mg/dL (ref 0.4–1.5)
Glucose, Bld: 118 mg/dL — ABNORMAL HIGH (ref 70–99)
Phosphorus: 3.5 mg/dL (ref 2.3–4.6)

## 2009-11-16 ENCOUNTER — Ambulatory Visit: Payer: Self-pay | Admitting: Internal Medicine

## 2009-11-18 ENCOUNTER — Telehealth (INDEPENDENT_AMBULATORY_CARE_PROVIDER_SITE_OTHER): Payer: Self-pay | Admitting: *Deleted

## 2009-11-19 ENCOUNTER — Ambulatory Visit: Payer: Self-pay | Admitting: Internal Medicine

## 2009-11-19 ENCOUNTER — Ambulatory Visit: Payer: Self-pay | Admitting: Pulmonary Disease

## 2009-11-23 ENCOUNTER — Telehealth: Payer: Self-pay | Admitting: Internal Medicine

## 2009-11-30 ENCOUNTER — Encounter: Payer: Self-pay | Admitting: Pulmonary Disease

## 2009-12-03 ENCOUNTER — Telehealth: Payer: Self-pay | Admitting: Pulmonary Disease

## 2009-12-03 ENCOUNTER — Ambulatory Visit: Payer: Self-pay | Admitting: Pulmonary Disease

## 2009-12-07 ENCOUNTER — Ambulatory Visit: Payer: Self-pay | Admitting: Pulmonary Disease

## 2009-12-17 ENCOUNTER — Ambulatory Visit: Payer: Self-pay | Admitting: Pulmonary Disease

## 2009-12-22 ENCOUNTER — Encounter: Payer: Self-pay | Admitting: Pulmonary Disease

## 2009-12-23 ENCOUNTER — Ambulatory Visit: Payer: Self-pay | Admitting: Internal Medicine

## 2009-12-25 LAB — CONVERTED CEMR LAB
AST: 27 units/L (ref 0–37)
Albumin: 3.5 g/dL (ref 3.5–5.2)
Alkaline Phosphatase: 56 units/L (ref 39–117)
Basophils Relative: 0.1 % (ref 0.0–3.0)
Bilirubin, Direct: 0.1 mg/dL (ref 0.0–0.3)
CO2: 29 meq/L (ref 19–32)
Calcium: 8.9 mg/dL (ref 8.4–10.5)
Chloride: 102 meq/L (ref 96–112)
Creatinine, Ser: 0.7 mg/dL (ref 0.4–1.5)
Eosinophils Absolute: 0.1 10*3/uL (ref 0.0–0.7)
Eosinophils Relative: 0.9 % (ref 0.0–5.0)
HCT: 38.8 % — ABNORMAL LOW (ref 39.0–52.0)
LDL Cholesterol: 88 mg/dL (ref 0–99)
Lymphs Abs: 0.7 10*3/uL (ref 0.7–4.0)
MCHC: 33.6 g/dL (ref 30.0–36.0)
MCV: 98.1 fL (ref 78.0–100.0)
Monocytes Absolute: 0.5 10*3/uL (ref 0.1–1.0)
Platelets: 182 10*3/uL (ref 150.0–400.0)
Total Bilirubin: 0.9 mg/dL (ref 0.3–1.2)
Total CHOL/HDL Ratio: 2
Triglycerides: 62 mg/dL (ref 0.0–149.0)
WBC: 9.8 10*3/uL (ref 4.5–10.5)

## 2009-12-29 ENCOUNTER — Encounter: Payer: Self-pay | Admitting: Pulmonary Disease

## 2009-12-31 ENCOUNTER — Ambulatory Visit: Payer: Self-pay | Admitting: Pulmonary Disease

## 2010-01-01 ENCOUNTER — Telehealth: Payer: Self-pay | Admitting: Family Medicine

## 2010-01-05 ENCOUNTER — Telehealth: Payer: Self-pay | Admitting: Internal Medicine

## 2010-01-09 ENCOUNTER — Encounter: Payer: Self-pay | Admitting: Internal Medicine

## 2010-01-15 ENCOUNTER — Ambulatory Visit: Payer: Self-pay | Admitting: Pulmonary Disease

## 2010-01-18 ENCOUNTER — Telehealth: Payer: Self-pay | Admitting: Internal Medicine

## 2010-01-19 ENCOUNTER — Telehealth: Payer: Self-pay | Admitting: Internal Medicine

## 2010-01-21 ENCOUNTER — Telehealth: Payer: Self-pay | Admitting: Internal Medicine

## 2010-01-25 ENCOUNTER — Ambulatory Visit: Payer: Self-pay | Admitting: Internal Medicine

## 2010-01-27 ENCOUNTER — Encounter: Payer: Self-pay | Admitting: Internal Medicine

## 2010-01-28 ENCOUNTER — Ambulatory Visit: Payer: Self-pay | Admitting: Pulmonary Disease

## 2010-02-05 ENCOUNTER — Encounter: Payer: Self-pay | Admitting: Pulmonary Disease

## 2010-02-05 ENCOUNTER — Ambulatory Visit: Payer: Self-pay | Admitting: Rheumatology

## 2010-02-05 ENCOUNTER — Encounter: Payer: Self-pay | Admitting: Internal Medicine

## 2010-02-12 ENCOUNTER — Ambulatory Visit: Payer: Self-pay | Admitting: Pulmonary Disease

## 2010-02-15 ENCOUNTER — Ambulatory Visit: Payer: Self-pay | Admitting: Pulmonary Disease

## 2010-02-19 ENCOUNTER — Encounter: Payer: Self-pay | Admitting: Internal Medicine

## 2010-02-22 ENCOUNTER — Ambulatory Visit: Payer: Self-pay | Admitting: Internal Medicine

## 2010-02-22 LAB — CONVERTED CEMR LAB
Ketones, urine, test strip: NEGATIVE
Nitrite: NEGATIVE
Urobilinogen, UA: 0.2

## 2010-02-23 ENCOUNTER — Encounter: Payer: Self-pay | Admitting: Internal Medicine

## 2010-02-25 ENCOUNTER — Telehealth: Payer: Self-pay | Admitting: Internal Medicine

## 2010-02-25 ENCOUNTER — Ambulatory Visit: Payer: Self-pay | Admitting: Pulmonary Disease

## 2010-02-26 ENCOUNTER — Encounter: Payer: Self-pay | Admitting: Internal Medicine

## 2010-02-26 HISTORY — PX: URETHRAL STRICTURE DILATATION: SHX477

## 2010-03-01 ENCOUNTER — Ambulatory Visit: Payer: Self-pay | Admitting: Internal Medicine

## 2010-03-02 ENCOUNTER — Telehealth: Payer: Self-pay | Admitting: Internal Medicine

## 2010-03-04 ENCOUNTER — Encounter: Payer: Self-pay | Admitting: Internal Medicine

## 2010-03-04 DIAGNOSIS — Z8546 Personal history of malignant neoplasm of prostate: Secondary | ICD-10-CM | POA: Insufficient documentation

## 2010-03-05 ENCOUNTER — Telehealth (INDEPENDENT_AMBULATORY_CARE_PROVIDER_SITE_OTHER): Payer: Self-pay | Admitting: *Deleted

## 2010-03-08 ENCOUNTER — Telehealth (INDEPENDENT_AMBULATORY_CARE_PROVIDER_SITE_OTHER): Payer: Self-pay | Admitting: *Deleted

## 2010-03-09 ENCOUNTER — Telehealth (INDEPENDENT_AMBULATORY_CARE_PROVIDER_SITE_OTHER): Payer: Self-pay | Admitting: *Deleted

## 2010-03-09 ENCOUNTER — Encounter: Payer: Self-pay | Admitting: Pulmonary Disease

## 2010-03-09 ENCOUNTER — Ambulatory Visit: Payer: Self-pay | Admitting: Urology

## 2010-03-10 ENCOUNTER — Ambulatory Visit: Payer: Self-pay | Admitting: Internal Medicine

## 2010-03-11 ENCOUNTER — Ambulatory Visit: Payer: Self-pay | Admitting: Pulmonary Disease

## 2010-03-11 ENCOUNTER — Telehealth: Payer: Self-pay | Admitting: Internal Medicine

## 2010-03-15 ENCOUNTER — Telehealth (INDEPENDENT_AMBULATORY_CARE_PROVIDER_SITE_OTHER): Payer: Self-pay | Admitting: *Deleted

## 2010-03-15 ENCOUNTER — Telehealth: Payer: Self-pay | Admitting: Internal Medicine

## 2010-03-16 ENCOUNTER — Ambulatory Visit: Payer: Self-pay | Admitting: Urology

## 2010-03-17 ENCOUNTER — Telehealth: Payer: Self-pay | Admitting: Internal Medicine

## 2010-03-18 ENCOUNTER — Telehealth: Payer: Self-pay | Admitting: Internal Medicine

## 2010-03-22 ENCOUNTER — Telehealth: Payer: Self-pay | Admitting: Internal Medicine

## 2010-03-25 ENCOUNTER — Ambulatory Visit: Payer: Self-pay | Admitting: Pulmonary Disease

## 2010-03-26 ENCOUNTER — Ambulatory Visit: Payer: Self-pay | Admitting: Internal Medicine

## 2010-03-29 ENCOUNTER — Encounter: Payer: Self-pay | Admitting: Internal Medicine

## 2010-03-31 ENCOUNTER — Encounter: Payer: Self-pay | Admitting: Internal Medicine

## 2010-03-31 ENCOUNTER — Encounter: Payer: Self-pay | Admitting: Pulmonary Disease

## 2010-04-05 ENCOUNTER — Ambulatory Visit: Payer: Self-pay | Admitting: Internal Medicine

## 2010-04-07 ENCOUNTER — Ambulatory Visit: Payer: Self-pay | Admitting: Pulmonary Disease

## 2010-04-07 ENCOUNTER — Encounter: Payer: Self-pay | Admitting: Pulmonary Disease

## 2010-04-09 ENCOUNTER — Encounter: Payer: Self-pay | Admitting: Internal Medicine

## 2010-04-10 ENCOUNTER — Emergency Department: Payer: Self-pay | Admitting: Unknown Physician Specialty

## 2010-04-15 ENCOUNTER — Encounter: Payer: Self-pay | Admitting: Internal Medicine

## 2010-04-16 ENCOUNTER — Encounter: Payer: Self-pay | Admitting: Internal Medicine

## 2010-04-16 ENCOUNTER — Emergency Department: Payer: Self-pay | Admitting: Emergency Medicine

## 2010-04-21 ENCOUNTER — Encounter: Payer: Self-pay | Admitting: Internal Medicine

## 2010-04-22 ENCOUNTER — Encounter: Payer: Self-pay | Admitting: Internal Medicine

## 2010-04-22 ENCOUNTER — Ambulatory Visit: Payer: Self-pay | Admitting: Pulmonary Disease

## 2010-04-22 ENCOUNTER — Telehealth: Payer: Self-pay | Admitting: Internal Medicine

## 2010-04-23 ENCOUNTER — Telehealth: Payer: Self-pay | Admitting: Internal Medicine

## 2010-04-23 ENCOUNTER — Emergency Department: Payer: Self-pay | Admitting: Unknown Physician Specialty

## 2010-04-23 ENCOUNTER — Encounter (INDEPENDENT_AMBULATORY_CARE_PROVIDER_SITE_OTHER): Payer: Self-pay | Admitting: *Deleted

## 2010-04-23 ENCOUNTER — Encounter: Payer: Self-pay | Admitting: Internal Medicine

## 2010-05-03 ENCOUNTER — Telehealth (INDEPENDENT_AMBULATORY_CARE_PROVIDER_SITE_OTHER): Payer: Self-pay

## 2010-05-03 ENCOUNTER — Ambulatory Visit: Payer: Self-pay | Admitting: Internal Medicine

## 2010-05-03 DIAGNOSIS — G589 Mononeuropathy, unspecified: Secondary | ICD-10-CM | POA: Insufficient documentation

## 2010-05-03 DIAGNOSIS — G629 Polyneuropathy, unspecified: Secondary | ICD-10-CM | POA: Insufficient documentation

## 2010-05-04 LAB — CONVERTED CEMR LAB
AST: 26 units/L (ref 0–37)
Albumin: 3.9 g/dL (ref 3.5–5.2)
Alkaline Phosphatase: 58 units/L (ref 39–117)
Basophils Absolute: 0 10*3/uL (ref 0.0–0.1)
Basophils Relative: 0.4 % (ref 0.0–3.0)
CO2: 29 meq/L (ref 19–32)
Creatinine, Ser: 0.6 mg/dL (ref 0.4–1.5)
Eosinophils Relative: 1.7 % (ref 0.0–5.0)
Free T4: 1.2 ng/dL (ref 0.6–1.6)
Glucose, Bld: 108 mg/dL — ABNORMAL HIGH (ref 70–99)
Hemoglobin: 13.2 g/dL (ref 13.0–17.0)
Lymphocytes Relative: 10.7 % — ABNORMAL LOW (ref 12.0–46.0)
Monocytes Relative: 5.4 % (ref 3.0–12.0)
Neutro Abs: 6.7 10*3/uL (ref 1.4–7.7)
PSA: 0 ng/mL — ABNORMAL LOW (ref 0.10–4.00)
RBC: 4.13 M/uL — ABNORMAL LOW (ref 4.22–5.81)
Total Protein: 6.2 g/dL (ref 6.0–8.3)
WBC: 8.2 10*3/uL (ref 4.5–10.5)

## 2010-05-05 ENCOUNTER — Ambulatory Visit: Payer: Self-pay | Admitting: Gastroenterology

## 2010-05-05 ENCOUNTER — Ambulatory Visit: Payer: Self-pay | Admitting: Pulmonary Disease

## 2010-05-05 DIAGNOSIS — K589 Irritable bowel syndrome without diarrhea: Secondary | ICD-10-CM | POA: Insufficient documentation

## 2010-05-05 DIAGNOSIS — Z86010 Personal history of colonic polyps: Secondary | ICD-10-CM | POA: Insufficient documentation

## 2010-05-13 ENCOUNTER — Telehealth (INDEPENDENT_AMBULATORY_CARE_PROVIDER_SITE_OTHER): Payer: Self-pay | Admitting: *Deleted

## 2010-05-14 ENCOUNTER — Encounter: Payer: Self-pay | Admitting: Internal Medicine

## 2010-05-18 ENCOUNTER — Ambulatory Visit: Payer: Self-pay | Admitting: Gastroenterology

## 2010-05-19 ENCOUNTER — Encounter: Payer: Self-pay | Admitting: Internal Medicine

## 2010-05-19 ENCOUNTER — Emergency Department: Payer: Self-pay | Admitting: Emergency Medicine

## 2010-05-21 ENCOUNTER — Ambulatory Visit: Payer: Self-pay | Admitting: Pulmonary Disease

## 2010-05-24 ENCOUNTER — Ambulatory Visit: Payer: Self-pay | Admitting: Internal Medicine

## 2010-05-24 DIAGNOSIS — N319 Neuromuscular dysfunction of bladder, unspecified: Secondary | ICD-10-CM | POA: Insufficient documentation

## 2010-05-25 ENCOUNTER — Encounter: Payer: Self-pay | Admitting: Internal Medicine

## 2010-06-01 ENCOUNTER — Ambulatory Visit: Payer: Self-pay | Admitting: Psychology

## 2010-06-04 ENCOUNTER — Ambulatory Visit: Payer: Self-pay | Admitting: Pulmonary Disease

## 2010-06-07 ENCOUNTER — Telehealth: Payer: Self-pay | Admitting: Internal Medicine

## 2010-06-08 ENCOUNTER — Telehealth: Payer: Self-pay | Admitting: Internal Medicine

## 2010-06-09 ENCOUNTER — Telehealth: Payer: Self-pay | Admitting: Gastroenterology

## 2010-06-11 ENCOUNTER — Ambulatory Visit: Payer: Self-pay | Admitting: Psychology

## 2010-06-14 ENCOUNTER — Ambulatory Visit: Payer: Self-pay | Admitting: Internal Medicine

## 2010-06-14 DIAGNOSIS — F329 Major depressive disorder, single episode, unspecified: Secondary | ICD-10-CM | POA: Insufficient documentation

## 2010-06-15 ENCOUNTER — Telehealth: Payer: Self-pay | Admitting: Gastroenterology

## 2010-06-18 ENCOUNTER — Encounter: Payer: Self-pay | Admitting: Pulmonary Disease

## 2010-06-24 ENCOUNTER — Ambulatory Visit: Payer: Self-pay | Admitting: Internal Medicine

## 2010-07-01 ENCOUNTER — Telehealth (INDEPENDENT_AMBULATORY_CARE_PROVIDER_SITE_OTHER): Payer: Self-pay | Admitting: *Deleted

## 2010-07-02 ENCOUNTER — Ambulatory Visit: Payer: Self-pay | Admitting: Pulmonary Disease

## 2010-07-02 ENCOUNTER — Encounter: Payer: Self-pay | Admitting: Internal Medicine

## 2010-07-08 ENCOUNTER — Encounter: Payer: Self-pay | Admitting: Internal Medicine

## 2010-07-09 ENCOUNTER — Encounter: Payer: Self-pay | Admitting: Internal Medicine

## 2010-07-14 ENCOUNTER — Ambulatory Visit: Payer: Self-pay | Admitting: Neurology

## 2010-07-14 ENCOUNTER — Emergency Department: Payer: Self-pay | Admitting: Emergency Medicine

## 2010-07-15 ENCOUNTER — Encounter: Payer: Self-pay | Admitting: Internal Medicine

## 2010-07-15 ENCOUNTER — Ambulatory Visit: Payer: Self-pay | Admitting: Internal Medicine

## 2010-07-19 ENCOUNTER — Ambulatory Visit: Payer: Self-pay | Admitting: Pulmonary Disease

## 2010-07-22 ENCOUNTER — Telehealth: Payer: Self-pay | Admitting: Internal Medicine

## 2010-07-23 ENCOUNTER — Ambulatory Visit: Payer: Self-pay | Admitting: Neurology

## 2010-07-26 ENCOUNTER — Ambulatory Visit: Payer: Self-pay | Admitting: Internal Medicine

## 2010-07-27 ENCOUNTER — Encounter: Payer: Self-pay | Admitting: Internal Medicine

## 2010-07-29 ENCOUNTER — Telehealth: Payer: Self-pay | Admitting: Internal Medicine

## 2010-07-30 ENCOUNTER — Ambulatory Visit: Payer: Self-pay | Admitting: Pulmonary Disease

## 2010-08-10 ENCOUNTER — Encounter: Payer: Self-pay | Admitting: Pulmonary Disease

## 2010-08-13 ENCOUNTER — Ambulatory Visit: Payer: Self-pay | Admitting: Pulmonary Disease

## 2010-08-20 ENCOUNTER — Encounter: Payer: Self-pay | Admitting: Internal Medicine

## 2010-08-23 ENCOUNTER — Ambulatory Visit: Payer: Self-pay | Admitting: Internal Medicine

## 2010-08-25 ENCOUNTER — Encounter: Payer: Self-pay | Admitting: Internal Medicine

## 2010-08-27 ENCOUNTER — Ambulatory Visit: Payer: Self-pay | Admitting: Pulmonary Disease

## 2010-08-30 ENCOUNTER — Telehealth: Payer: Self-pay | Admitting: Internal Medicine

## 2010-09-10 ENCOUNTER — Ambulatory Visit: Payer: Self-pay | Admitting: Pulmonary Disease

## 2010-09-24 ENCOUNTER — Ambulatory Visit: Payer: Self-pay | Admitting: Internal Medicine

## 2010-09-24 ENCOUNTER — Ambulatory Visit: Payer: Self-pay | Admitting: Pulmonary Disease

## 2010-09-26 ENCOUNTER — Emergency Department: Payer: Self-pay | Admitting: Emergency Medicine

## 2010-10-01 ENCOUNTER — Ambulatory Visit: Payer: Self-pay | Admitting: Internal Medicine

## 2010-10-04 LAB — CONVERTED CEMR LAB
Calcium: 9.2 mg/dL (ref 8.4–10.5)
Creatinine, Ser: 0.5 mg/dL (ref 0.4–1.5)
Glucose, Bld: 86 mg/dL (ref 70–99)
Sodium: 123 meq/L — ABNORMAL LOW (ref 135–145)

## 2010-10-06 ENCOUNTER — Telehealth: Payer: Self-pay | Admitting: Internal Medicine

## 2010-10-08 ENCOUNTER — Ambulatory Visit: Payer: Self-pay | Admitting: Pulmonary Disease

## 2010-10-18 ENCOUNTER — Ambulatory Visit: Payer: Self-pay | Admitting: Internal Medicine

## 2010-10-20 LAB — CONVERTED CEMR LAB
Albumin: 3.5 g/dL (ref 3.5–5.2)
BUN: 9 mg/dL (ref 6–23)
Calcium: 8.7 mg/dL (ref 8.4–10.5)
Creatinine, Ser: 0.5 mg/dL (ref 0.4–1.5)
Glucose, Bld: 92 mg/dL (ref 70–99)
Sodium: 127 meq/L — ABNORMAL LOW (ref 135–145)

## 2010-10-25 ENCOUNTER — Ambulatory Visit: Payer: Self-pay | Admitting: Pulmonary Disease

## 2010-10-26 ENCOUNTER — Ambulatory Visit: Payer: Self-pay | Admitting: Internal Medicine

## 2010-10-29 ENCOUNTER — Telehealth: Payer: Self-pay | Admitting: Internal Medicine

## 2010-11-02 ENCOUNTER — Encounter: Payer: Self-pay | Admitting: Internal Medicine

## 2010-11-03 ENCOUNTER — Encounter: Payer: Self-pay | Admitting: Internal Medicine

## 2010-11-05 ENCOUNTER — Ambulatory Visit: Payer: Self-pay | Admitting: Pulmonary Disease

## 2010-11-16 ENCOUNTER — Ambulatory Visit: Payer: Self-pay | Admitting: Internal Medicine

## 2010-11-18 ENCOUNTER — Ambulatory Visit: Payer: Self-pay | Admitting: Pulmonary Disease

## 2010-11-20 ENCOUNTER — Inpatient Hospital Stay: Payer: Self-pay | Admitting: Internal Medicine

## 2010-11-20 ENCOUNTER — Encounter: Payer: Self-pay | Admitting: Internal Medicine

## 2010-11-21 ENCOUNTER — Encounter: Payer: Self-pay | Admitting: Internal Medicine

## 2010-11-23 ENCOUNTER — Telehealth: Payer: Self-pay | Admitting: Internal Medicine

## 2010-11-23 ENCOUNTER — Encounter: Payer: Self-pay | Admitting: Internal Medicine

## 2010-11-30 ENCOUNTER — Encounter: Payer: Self-pay | Admitting: Internal Medicine

## 2010-12-01 ENCOUNTER — Telehealth: Payer: Self-pay | Admitting: Internal Medicine

## 2010-12-02 ENCOUNTER — Telehealth: Payer: Self-pay | Admitting: Gastroenterology

## 2010-12-02 ENCOUNTER — Other Ambulatory Visit: Payer: Self-pay | Admitting: Physician Assistant

## 2010-12-02 ENCOUNTER — Ambulatory Visit
Admission: RE | Admit: 2010-12-02 | Discharge: 2010-12-02 | Payer: Self-pay | Source: Home / Self Care | Attending: Pulmonary Disease | Admitting: Pulmonary Disease

## 2010-12-02 ENCOUNTER — Ambulatory Visit
Admission: RE | Admit: 2010-12-02 | Discharge: 2010-12-02 | Payer: Self-pay | Source: Home / Self Care | Attending: Gastroenterology | Admitting: Gastroenterology

## 2010-12-02 DIAGNOSIS — M1990 Unspecified osteoarthritis, unspecified site: Secondary | ICD-10-CM | POA: Insufficient documentation

## 2010-12-02 DIAGNOSIS — E785 Hyperlipidemia, unspecified: Secondary | ICD-10-CM | POA: Insufficient documentation

## 2010-12-02 DIAGNOSIS — K625 Hemorrhage of anus and rectum: Secondary | ICD-10-CM | POA: Insufficient documentation

## 2010-12-02 LAB — CBC WITH DIFFERENTIAL/PLATELET
Basophils Absolute: 0 10*3/uL (ref 0.0–0.1)
Basophils Relative: 0.3 % (ref 0.0–3.0)
Eosinophils Absolute: 0.1 10*3/uL (ref 0.0–0.7)
Eosinophils Relative: 1.1 % (ref 0.0–5.0)
HCT: 39.7 % (ref 39.0–52.0)
Hemoglobin: 13.6 g/dL (ref 13.0–17.0)
Lymphocytes Relative: 7.3 % — ABNORMAL LOW (ref 12.0–46.0)
Lymphs Abs: 0.9 10*3/uL (ref 0.7–4.0)
MCHC: 34.2 g/dL (ref 30.0–36.0)
MCV: 92.8 fl (ref 78.0–100.0)
Monocytes Absolute: 0.8 10*3/uL (ref 0.1–1.0)
Monocytes Relative: 6.7 % (ref 3.0–12.0)
Neutro Abs: 10.6 10*3/uL — ABNORMAL HIGH (ref 1.4–7.7)
Neutrophils Relative %: 84.6 % — ABNORMAL HIGH (ref 43.0–77.0)
Platelets: 326 10*3/uL (ref 150.0–400.0)
RBC: 4.27 Mil/uL (ref 4.22–5.81)
RDW: 15.4 % — ABNORMAL HIGH (ref 11.5–14.6)
WBC: 12.5 10*3/uL — ABNORMAL HIGH (ref 4.5–10.5)

## 2010-12-02 LAB — BASIC METABOLIC PANEL
BUN: 17 mg/dL (ref 6–23)
CO2: 27 mEq/L (ref 19–32)
Calcium: 8.9 mg/dL (ref 8.4–10.5)
Chloride: 90 mEq/L — ABNORMAL LOW (ref 96–112)
Creatinine, Ser: 0.7 mg/dL (ref 0.4–1.5)
GFR: 117 mL/min (ref >60.00)
Glucose, Bld: 101 mg/dL — ABNORMAL HIGH (ref 70–99)
Potassium: 4.5 mEq/L (ref 3.5–5.1)
Sodium: 127 mEq/L — ABNORMAL LOW (ref 135–145)

## 2010-12-03 ENCOUNTER — Ambulatory Visit: Payer: Self-pay | Admitting: Internal Medicine

## 2010-12-03 ENCOUNTER — Ambulatory Visit
Admission: RE | Admit: 2010-12-03 | Discharge: 2010-12-03 | Payer: Self-pay | Source: Home / Self Care | Attending: Physician Assistant | Admitting: Physician Assistant

## 2010-12-03 ENCOUNTER — Ambulatory Visit: Admit: 2010-12-03 | Payer: Self-pay | Admitting: Internal Medicine

## 2010-12-07 ENCOUNTER — Ambulatory Visit
Admission: RE | Admit: 2010-12-07 | Discharge: 2010-12-07 | Payer: Self-pay | Source: Home / Self Care | Attending: Internal Medicine | Admitting: Internal Medicine

## 2010-12-07 ENCOUNTER — Other Ambulatory Visit: Payer: Self-pay | Admitting: Internal Medicine

## 2010-12-07 DIAGNOSIS — K5909 Other constipation: Secondary | ICD-10-CM | POA: Insufficient documentation

## 2010-12-07 LAB — RENAL FUNCTION PANEL
Albumin: 3.5 g/dL (ref 3.5–5.2)
BUN: 20 mg/dL (ref 6–23)
CO2: 31 mEq/L (ref 19–32)
Calcium: 9.2 mg/dL (ref 8.4–10.5)
Chloride: 100 mEq/L (ref 96–112)
Creatinine, Ser: 0.6 mg/dL (ref 0.4–1.5)
GFR: 137.13 mL/min (ref >60.00)
Glucose, Bld: 89 mg/dL (ref 70–99)
Phosphorus: 3.2 mg/dL (ref 2.3–4.6)
Potassium: 4.9 mEq/L (ref 3.5–5.1)
Sodium: 138 mEq/L (ref 135–145)

## 2010-12-15 ENCOUNTER — Telehealth: Payer: Self-pay | Admitting: Internal Medicine

## 2010-12-15 ENCOUNTER — Encounter: Payer: Self-pay | Admitting: Internal Medicine

## 2010-12-16 ENCOUNTER — Encounter: Payer: Self-pay | Admitting: Internal Medicine

## 2010-12-16 ENCOUNTER — Ambulatory Visit
Admission: RE | Admit: 2010-12-16 | Discharge: 2010-12-16 | Payer: Self-pay | Source: Home / Self Care | Attending: Pulmonary Disease | Admitting: Pulmonary Disease

## 2010-12-17 ENCOUNTER — Encounter: Payer: Self-pay | Admitting: Internal Medicine

## 2010-12-17 ENCOUNTER — Ambulatory Visit: Payer: Self-pay | Admitting: Internal Medicine

## 2010-12-21 ENCOUNTER — Ambulatory Visit
Admission: RE | Admit: 2010-12-21 | Discharge: 2010-12-21 | Payer: Self-pay | Source: Home / Self Care | Attending: Internal Medicine | Admitting: Internal Medicine

## 2010-12-21 ENCOUNTER — Other Ambulatory Visit: Payer: Self-pay | Admitting: Internal Medicine

## 2010-12-21 DIAGNOSIS — G479 Sleep disorder, unspecified: Secondary | ICD-10-CM | POA: Insufficient documentation

## 2010-12-21 LAB — BASIC METABOLIC PANEL
BUN: 20 mg/dL (ref 6–23)
CO2: 32 mEq/L (ref 19–32)
Chloride: 107 mEq/L (ref 96–112)
Creatinine, Ser: 0.7 mg/dL (ref 0.4–1.5)

## 2010-12-24 ENCOUNTER — Encounter: Payer: Self-pay | Admitting: Internal Medicine

## 2010-12-24 DIAGNOSIS — N390 Urinary tract infection, site not specified: Secondary | ICD-10-CM

## 2010-12-24 DIAGNOSIS — M6281 Muscle weakness (generalized): Secondary | ICD-10-CM

## 2010-12-24 DIAGNOSIS — E236 Other disorders of pituitary gland: Secondary | ICD-10-CM

## 2010-12-24 DIAGNOSIS — A499 Bacterial infection, unspecified: Secondary | ICD-10-CM

## 2010-12-28 NOTE — Progress Notes (Signed)
Summary: regarding miralax  Phone Note Call from Patient Call back at Home Phone [phone]   Caller: Patient Call For: Cindee Salt MD Summary of Call: Pt started taking miralax every two hours yesterday.  He did have a BM this morning but he feels like he could have had a better one, still has a little pain but not very much.  He is asking if he can continue the miralax today, should he reduce the frequency of how often he takes it? Initial call taken by: Lowella Petties CMA,  March 15, 2010 8:46 AM  Follow-up for Phone Call        We spoke by phone yesterday when he called my house. I advised the miralax when it seemed clear he was obstipated.   Please have him continue the miralax. He probably should continue it every 2 -3 hours till he feels cleaned out. If he continues the levsin, he should keep using the miralax once to twice daily to maintain normal bowel habits. Follow-up by: Cindee Salt MD,  March 15, 2010 10:57 AM  Additional Follow-up for Phone Call Additional follow up Details #1::        Spoke with patient and advised results.  Additional Follow-up by: Mervin Hack CMA [name]),  March 15, 2010 2:25 PM

## 2010-12-28 NOTE — Assessment & Plan Note (Signed)
Summary: rov/ mbw   Visit Type:  Follow-up Copy to:  Dr. Andee Poles, Dr. Saverio Danker, Dr. Assunta Gambles Primary Provider/Referring Provider:  Tillman Abide, MD  CC:  Asthma follow-up...pt c/o sinus congestion and cough...mucus is now clear...still taking Augmentin and Prednisone at 20mg  today...needs RX for Symbicort and nebulizer medications.  History of Present Illness: 77 year old male with known history of  asthma, OSA, and rhinits.  He was doing well until 2 weeks ago.  His wife caught a cold, and he thinks he picked this up.  He started getting a cough and sore throat.  He also had sinus congestion and post-nasal drip.  His breathing and wheeze got worse.  He had to use his albuterol more.  He ran out of symbicort about 4 weeks ago.  He was treated with augmentin and increased prednisone by PCP.  He has improved, but still gets some wheeze and cough.  He denies fever or hemoptysis.  He has not been using BPAP.  He is sleeping well, and his wife has not heard him snore since he lost weight.   Current Medications (verified): 1)  Hydrocodone-Acetaminophen 5-325 Mg Tabs (Hydrocodone-Acetaminophen) .Marland Kitchen.. 1 Up To Four Times Daily For Pain 2)  Symbicort 160-4.5 Mcg/act Aero (Budesonide-Formoterol Fumarate) .... 2 Puffs Two Times A Day 3)  Xolair 150 Mg Solr (Omalizumab) .... 375 Mg Every Two Weeks Subcutaneously 4)  Albuterol Sulfate (2.5 Mg/53ml) 0.083% Nebu (Albuterol Sulfate) .... One Vial Nebulized Up To Four Times Per Day As Needed 5)  Ventolin Hfa 108 (90 Base) Mcg/act Aers (Albuterol Sulfate) .... Two Puffs Up To Four Times Per Day As Needed 6)  Methotrexate 2.5 Mg Tabs (Methotrexate Sodium) .... 9 Tablets Weekly For Rheumatoid Arthritis 7)  Metoprolol Succinate 50 Mg Xr24h-Tab (Metoprolol Succinate) .Marland Kitchen.. 1 Tab Daily For High Blood Pressure 8)  Temazepam 15 Mg  Caps (Temazepam) .... Take 1or 2 At Bedtime As Needed To Help Sleep 9)  Alprazolam 0.5 Mg Tabs (Alprazolam) .Marland Kitchen.. 1-2 Tabs By  Mouth Three Times A Day As Needed For Nerves 10)  Omeprazole 20 Mg Tbec (Omeprazole) .... Take 1 By Mouth Once Daily 11)  Mirtazapine 15 Mg Tabs (Mirtazapine) .Marland Kitchen.. 1 Tab At Bedtime For Depression and Poor Eating 12)  Pravastatin Sodium 20 Mg Tabs (Pravastatin Sodium) .Marland Kitchen.. 1 Tab Daily For High Cholesterol 13)  Hydroxychloroquine Sulfate 200 Mg Tabs (Hydroxychloroquine Sulfate) .Marland Kitchen.. 1 Tab By Mouth Two Times A Day For Rheumatoid Arthritis 14)  Gabapentin 300 Mg Caps (Gabapentin) .Marland Kitchen.. 1 Tab By Mouth Three Times A Day For Neuropathy 15)  Adult Aspirin Ec Low Strength 81 Mg  Tbec (Aspirin) .... Take 1 Tablet By Mouth Once A Day 16)  Multivitamins   Tabs (Multiple Vitamin) .... Take 1 Tablet By Mouth Once A Day 17)  Miralax  Powd (Polyethylene Glycol 3350) .Marland KitchenMarland KitchenMarland Kitchen 17 Grams in 8 Oz of Water 1-2 X Daily 18)  Dulcolax Stool Softener 100 Mg Caps (Docusate Sodium) .... 2 By Mouth Two Times A Day 19)  Vesicare 5 Mg Tabs (Solifenacin Succinate) .... Take One By Mouth Daily 20)  Prednisone 1 Mg Tabs (Prednisone) .... Take As Directed 21)  Prednisone 20 Mg Tabs (Prednisone) .... 2 Tabs Daily For 5 Days For Asthma Flare 22)  Amoxicillin-Pot Clavulanate 875-125 Mg Tabs (Amoxicillin-Pot Clavulanate) .Marland Kitchen.. 1 Tab By Mouth Two Times A Day After Food For Sinus Infection  Allergies (verified): 1)  ! Cipro 2)  ! Altace 3)  ! Zocor 4)  ! Celexa 5)  !  Ativan 6)  ! Paxil  Past History:  Past Medical History: Reviewed history from 06/14/2010 and no changes required. CAD Asthma      - PFT 04/07/10 FEV1 2.61(86%), FVC 3.60(78%), FEV1% 73, TLC 7.26(105%), DLCO 112%, no BD      - RAST and IgE check on 07/23/09.  His IgE was 527.7. Chronic sinusitis Diverticulosis GERD Rheumatoid arthritis Prostate Cancer  Irritable bowel syndrome Anxiety Neurodermatitis Erectile dysfunction Obstructive sleep apnea      - BPAP 14/10 (12/09/09)>>stopped after weight loss Allergic rhinitis Hypertension Adenomatous Colon Polyps  1990 Depression  Past Surgical History: Reviewed history from 03/26/2010 and no changes required. Radical prostatectomy Left knee arthroscopy Sinus surgery 2009 (deviated septum/polyps) Urethral stricture repair  4/11  Dr Achilles Dunk  Vital Signs:  Patient profile:   77 year old male Height:      71 inches (180.34 cm) Weight:      198.25 pounds (90.11 kg) BMI:     27.75 O2 Sat:      96 % on Room air Temp:     97.8 degrees F (36.56 degrees C) oral Pulse rate:   71 / minute BP sitting:   120 / 78  (left arm) Cuff size:   regular  Vitals Entered By: Michel Bickers CMA (October 25, 2010 4:40 PM)  O2 Sat at Rest %:  96 O2 Flow:  Room air CC: Asthma follow-up...pt c/o sinus congestion and cough...mucus is now clear...still taking Augmentin and Prednisone at 20mg  today...needs RX for Symbicort and nebulizer medications Comments Medications reviewed with patient Michel Bickers [facility]  October 25, 2010 4:41 PM   Physical Exam  General:  thin.   Nose:  no nasal discharge, no sinus tenderness Mouth:  no oral lesion Neck:  no JVD.   Lungs:  faint expiratory wheeze more at bases, no rales, no dullness Heart:  regular rhythm, normal rate, and no murmurs.   Extremities:  minimal joint swelling in his hands Neurologic:  normal CN II-XII and strength normal.   Cervical Nodes:  no significant adenopathy Psych:  anxious.     Impression & Recommendations:  Problem # 1:  ASTHMA (ICD-493.90) He likely had a viral bronchitis that triggered a flare of his asthma.  He is slowly improving.  I will give him a slow taper of prednisone.  Will restart symbicort.  I don't think he needs a chest xray or additional antibiotics at this time.  He is to continue [employer].  Problem # 2:  ALLERGIC RHINITIS (ICD-477.9) He is to continue his sinus regimen.  Medications Added to Medication List This Visit: 1)  Prednisone 5 Mg/13ml Soln (Prednisone) .... 4 pills once daily for 3 days, 3 pills once daily for 3 days, 2  pills once daily for 3 days, 1 pill once daily for 3 days 2)  Symbicort 160-4.5 Mcg/act Aero (Budesonide-formoterol fumarate) .... Two puffs two times a day 3)  Ipratropium-albuterol 0.5-2.5 (3) Mg/32ml Soln (Ipratropium-albuterol) .... One vial nebulized four times per day as needed  Complete Medication List: 1)  Hydrocodone-acetaminophen 5-325 Mg Tabs (Hydrocodone-acetaminophen) .Marland Kitchen.. 1 up to four times daily for pain 2)  Xolair 150 Mg Solr (Omalizumab) .... 375 mg every two weeks subcutaneously 3)  Ventolin Hfa 108 (90 Base) Mcg/act Aers (Albuterol sulfate) .... Two puffs up to four times per day as needed 4)  Methotrexate 2.5 Mg Tabs (Methotrexate sodium) .... 9 tablets weekly for rheumatoid arthritis 5)  Metoprolol Succinate 50 Mg Xr24h-tab (Metoprolol succinate) .Marland Kitchen.. 1 tab daily for  high blood pressure 6)  Temazepam 15 Mg Caps (Temazepam) .... Take 1or 2 at bedtime as needed to help sleep 7)  Alprazolam 0.5 Mg Tabs (Alprazolam) .Marland Kitchen.. 1-2 tabs by mouth three times a day as needed for nerves 8)  Omeprazole 20 Mg Tbec (Omeprazole) .... Take 1 by mouth once daily 9)  Mirtazapine 15 Mg Tabs (Mirtazapine) .Marland Kitchen.. 1 tab at bedtime for depression and poor eating 10)  Pravastatin Sodium 20 Mg Tabs (Pravastatin sodium) .Marland Kitchen.. 1 tab daily for high cholesterol 11)  Hydroxychloroquine Sulfate 200 Mg Tabs (Hydroxychloroquine sulfate) .Marland Kitchen.. 1 tab by mouth two times a day for rheumatoid arthritis 12)  Gabapentin 300 Mg Caps (Gabapentin) .Marland Kitchen.. 1 tab by mouth three times a day for neuropathy 13)  Adult Aspirin Ec Low Strength 81 Mg Tbec (Aspirin) .... Take 1 tablet by mouth once a day 14)  Multivitamins Tabs (Multiple vitamin) .... Take 1 tablet by mouth once a day 15)  Miralax Powd (Polyethylene glycol 3350) .Marland KitchenMarland KitchenMarland Kitchen 17 grams in 8 oz of water 1-2 x daily 16)  Dulcolax Stool Softener 100 Mg Caps (Docusate sodium) .... 2 by mouth two times a day 17)  Vesicare 5 Mg Tabs (Solifenacin succinate) .... Take one by mouth  daily 18)  Prednisone 1 Mg Tabs (Prednisone) .... Take as directed 19)  Prednisone 5 Mg/10ml Soln (Prednisone) .... 4 pills once daily for 3 days, 3 pills once daily for 3 days, 2 pills once daily for 3 days, 1 pill once daily for 3 days 20)  Amoxicillin-pot Clavulanate 875-125 Mg Tabs (Amoxicillin-pot clavulanate) .Marland Kitchen.. 1 tab by mouth two times a day after food for sinus infection 21)  Symbicort 160-4.5 Mcg/act Aero (Budesonide-formoterol fumarate) .... Two puffs two times a day 22)  Ipratropium-albuterol 0.5-2.5 (3) Mg/35ml Soln (Ipratropium-albuterol) .... One vial nebulized four times per day as needed  Other Orders: Est. Patient Level IV (16109)  Patient Instructions: 1)  Prednisone 5 mg pills: 4 pills for 3 days, 3 pills for 3 days, 2 pills for 3 days, 1 pill for 3 days, then start prednisone 1 mg once daily 2)  Symbicort two puffs two times a day 3)  Follow up in 6 weeks Prescriptions: IPRATROPIUM-ALBUTEROL 0.5-2.5 (3) MG/3ML SOLN (IPRATROPIUM-ALBUTEROL) one vial nebulized four times per day as needed  #120 x 6   Entered and Authorized by:   Coralyn Helling MD   Signed by:   Coralyn Helling MD on 10/25/2010   Method used:   Faxed to ...       [facility] (retail)       [address]       [place], [place]    [place]       Ph: [phone]       Fax: [phone]   RxID:   [id number] SYMBICORT 160-4.5 MCG/ACT AERO (BUDESONIDE-FORMOTEROL FUMARATE) two puffs two times a day  #1 x 6   Entered and Authorized by:   Coralyn Helling MD   Signed by:   Coralyn Helling MD on 10/25/2010   Method used:   Faxed to ...       [facility] (retail)       [address]       [place], [place]    [place]       Ph: [phone]       Fax: [phone]   RxID:   [id number] PREDNISONE 5 MG/5ML SOLN (PREDNISONE) 4 pills once daily for 3 days, 3 pills once daily for 3  days, 2 pills once daily for 3 days, 1 pill once daily for 3 days  #30 x 1   Entered and Authorized by:   Coralyn Helling MD    Signed by:   Coralyn Helling MD on 10/25/2010   Method used:   Faxed to ...       [facility] (retail)       [address]       [place], [place]    [place]       Ph: [phone]       Fax: [phone]   RxID:   [id number]     Appended Document: Orders Update     Clinical Lists Changes        Medication Administration  Injection # 1:    Medication: Xolair (omalizumab) 150mg     Diagnosis: EXTRINSIC ASTHMA, UNSPECIFIED (ICD-493.00)    Route: SQ    Site: R deltoid    Exp Date: 08/2013    Lot #: [id number]    Mfr: Salome Spotted    Comments: 1.0 ML X 2 IN RIGHT ARM AND  1.0 ML IN LEFT ARM 375MG  CHARGED J2357 AND 41324    Patient tolerated injection without complications    Given by: TAMMY SCOTT IN [facility]

## 2010-12-28 NOTE — Assessment & Plan Note (Signed)
Summary: xolair/apc   Nurse Visit   Allergies: 1)  ! Cipro 2)  ! Altace 3)  ! Zocor 4)  ! Celexa 5)  ! Ativan 6)  ! Paxil  Medication Administration  Injection # 1:    Medication: Xolair (omalizumab) 150mg     Diagnosis: ASTHMA (ICD-493.90)    Route: SQ    Site: L deltoid    Exp Date: 03/28/2013    Lot #: [id number]    Mfr: GENENTECH    Comments: 1.0 ML X 2 IN LEFT ARM AND 1.0 ML IN RIGHT ARM PT DIDNT WAIT ALSO CHARGED THE [phone] X 2     Given by: TAMMY SCOTT IN [facility]   Medication Administration  Injection # 1:    Medication: Xolair (omalizumab) 150mg     Diagnosis: ASTHMA (ICD-493.90)    Route: SQ    Site: L deltoid    Exp Date: 03/28/2013    Lot #: [id number]    Mfr: GENENTECH    Comments: 1.0 ML X 2 IN LEFT ARM AND 1.0 ML IN RIGHT ARM PT DIDNT WAIT ALSO CHARGED THE [phone] X 2     Given by: TAMMY SCOTT [facility]  Appended Document: xolair/apc This patients diagnosis is 493.00 not 493.90/skb

## 2010-12-28 NOTE — Progress Notes (Signed)
Summary: leg still painful and swollen  Phone Note Call from Patient Call back at [phone]   Caller: Patient Call For: Cindee Salt MD Summary of Call: Pt states his left leg is still swollen and extremely painfull.  Ankle is very swollen.  He said the cellulitis had  improved when he first started the antibiotic but he hasnt seen much improvement since.  He has appt with you on monday but will finish the abx tomorrow.  He is asking if he should get a refill on this.  Taking clindomycin three times a day, 300 mg each.   Uses [facility] [address]. Initial call taken by: Lowella Petties CMA,  July 22, 2010 3:13 PM  Follow-up for Phone Call        send Rx for enough clinda to continue till appt on Monday If it is worse tomorrow morning, call for appt for reeval (would have to see another doctor as I am off) Follow-up by: Cindee Salt MD,  July 22, 2010 3:39 PM  Additional Follow-up for Phone Call Additional follow up Details #1::        Patient advised as instructed via telephone.  Advised that if he gets worse to call our office to schedule appt to be seen however Dr. Alphonsus Sias will not be here tomorrow.  Rx sent to [facility]  12 pills sent in enough until his appt on Monday. Additional Follow-up by: Linde Gillis CMA [name]),  July 22, 2010 4:21 PM    New/Updated Medications: CLINDAMYCIN HCL 300 MG CAPS (CLINDAMYCIN HCL) take one tablet by mouth three times a day Prescriptions: CLINDAMYCIN HCL 300 MG CAPS (CLINDAMYCIN HCL) take one tablet by mouth three times a day  #12 x 0   Entered by:   Linde Gillis CMA (AAMA)   Authorized by:   Cindee Salt MD   Signed by:   Linde Gillis CMA (AAMA) on 07/22/2010   Method used:   Electronically to        [facility] [address] (retail)       [address], [address]       [place], [place]  [address]       Ph: [phone]       Fax: [phone]   RxID:   [id number]   Prior  Medications: LEVSIN/SL 0.125 MG SUBL (HYOSCYAMINE SULFATE) 1-2 tablets by mouth under tongue or by mouth four times a day as needed SYMBICORT 160-4.5 MCG/ACT AERO (BUDESONIDE-FORMOTEROL FUMARATE) 2 puffs two times a day XOLAIR 150 MG SOLR (OMALIZUMAB) 375 mg every two weeks subcutaneously ALBUTEROL SULFATE (2.5 MG/3ML) 0.083% NEBU (ALBUTEROL SULFATE) one vial nebulized up to four times per day as needed VENTOLIN HFA 108 (90 BASE) MCG/ACT AERS (ALBUTEROL SULFATE) two puffs up to four times per day as needed METHOTREXATE 2.5 MG TABS (METHOTREXATE SODIUM) 9 tablets weekly for rheumatoid arthritis PREDNISONE 5 MG TABS (PREDNISONE) take 1  by mouth once daily METOPROLOL SUCCINATE 50 MG XR24H-TAB (METOPROLOL SUCCINATE) 1 tab daily for high blood pressure ADULT ASPIRIN EC LOW STRENGTH 81 MG  TBEC (ASPIRIN) Take 1 tablet by mouth once a day TEMAZEPAM 15 MG  CAPS (TEMAZEPAM) Take 1or 2 at bedtime as needed to help sleep ALPRAZOLAM 0.5 MG TABS (ALPRAZOLAM) 1-2 tabs by mouth three times a day as needed for nerves MULTIVITAMINS   TABS (MULTIPLE VITAMIN) Take 1 tablet by mouth once a day MIRALAX  POWD (POLYETHYLENE GLYCOL 3350) 17  grams in 8 oz of water 1-2 x daily EPIPEN 2-PAK 0.3 MG/0.3ML DEVI (EPINEPHRINE) use as directed as needed for severe allergic reaction OMEPRAZOLE 20 MG TBEC (OMEPRAZOLE) take 1 by mouth once daily MIRTAZAPINE 15 MG TABS (MIRTAZAPINE) 1 tab at bedtime for depression and poor eating DULCOLAX STOOL SOFTENER 100 MG CAPS (DOCUSATE SODIUM) 2 by mouth two times a day PRAVASTATIN SODIUM 20 MG TABS (PRAVASTATIN SODIUM) 1 tab daily for high cholesterol HYDROXYCHLOROQUINE SULFATE 200 MG TABS (HYDROXYCHLOROQUINE SULFATE) 1 tab by mouth two times a day for rheumatoid arthritis GABAPENTIN 300 MG CAPS (GABAPENTIN) 1 tab by mouth three times a day for neuropathy Current Allergies: ! CIPRO ! ALTACE ! ZOCOR ! CELEXA ! ATIVAN ! PAXIL

## 2010-12-28 NOTE — Assessment & Plan Note (Signed)
Summary: 3 M F/U DLO   Vital Signs:  Patient profile:   77 year old male Weight:      194 pounds Temp:     97.7 degrees F oral BP sitting:   120 / 70  (left arm) Cuff size:   regular  Vitals Entered By: Mervin Hack CMA [name]) (October 26, 2010 12:33 PM) CC: 3 month follow-up   History of Present Illness: was not getting better so went and saw Dr Craige Cotta extended his prednisone to bridge to his appt still had some wheezing He extended the wean of prednisone for slow reduction Had stopped the symbicort---mixup---but he is back on this now  Has had some anxiety--this is notable with the breathing problems Ongoing issues that affect him QOL isn't great but not anhedonic. Nice Thanksgiving with family  Has catheter in still Has been having some leakage at times some skin soreness Not ready to try the intermittent caths now  No change with arthritis ongoing pain continues on same meds--MTX, hydroxychloroquine and ibuprofen  Allergies: 1)  ! Cipro 2)  ! Altace 3)  ! Zocor 4)  ! Celexa 5)  ! Ativan 6)  ! Paxil  Past History:  Past medical, surgical, family and social histories (including risk factors) reviewed for relevance to current acute and chronic problems.  Past Medical History: Reviewed history from 06/14/2010 and no changes required. CAD Asthma      - PFT 04/07/10 FEV1 2.61(86%), FVC 3.60(78%), FEV1% 73, TLC 7.26(105%), DLCO 112%, no BD      - RAST and IgE check on 07/23/09.  His IgE was 527.7. Chronic sinusitis Diverticulosis GERD Rheumatoid arthritis Prostate Cancer  Irritable bowel syndrome Anxiety Neurodermatitis Erectile dysfunction Obstructive sleep apnea      - BPAP 14/10 (12/09/09)>>stopped after weight loss Allergic rhinitis Hypertension Adenomatous Colon Polyps 1990 Depression  Past Surgical History: Reviewed history from 03/26/2010 and no changes required. Radical prostatectomy Left knee arthroscopy Sinus surgery 2009 (deviated  septum/polyps) Urethral stricture repair  4/11  Dr Achilles Dunk  Family History: Reviewed history from 05/04/2009 and no changes required. Son - asthma, allergies Mother-deceased at age 75, heart failure Father-deceased at age 21 pneumonia 4 sisters healthy  Social History: Reviewed history from 05/12/2008 and no changes required. 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 Never Smoked Alcohol use-occasional Regular exercise-no  Review of Systems       sleep isn't great now due to the cough weight is down a few pounds appetite isn't great right now   Physical Exam  General:  alert.  NAD Neck:  supple, no masses, and no cervical lymphadenopathy.   Lungs:  normal respiratory effort, no intercostal retractions, and no accessory muscle use.  Mild decreased breath sounds Slight exp phase prolongation and mild exp wheezing Heart:  normal rate, regular rhythm, no murmur, and no gallop.   Extremities:  no edema Psych:  normally interactive, good eye contact, not depressed appearing, and slightly anxious.     Impression & Recommendations:  Problem # 1:  EXTRINSIC ASTHMA, UNSPECIFIED (ICD-493.00) Assessment Improved better but not cleared up changes per Dr Craige Cotta will set up follow up in a few weeks  The following medications were removed from the medication list:    Prednisone 5 Mg/24ml Soln (Prednisone) .Marland KitchenMarland KitchenMarland KitchenMarland Kitchen 4 pills once daily for 3 days, 3 pills once daily for 3 days, 2 pills once daily for 3 days, 1 pill once daily for 3 days His updated medication list for this problem includes:    Xolair 150  Mg Solr (Omalizumab) .Marland KitchenMarland KitchenMarland KitchenMarland Kitchen 375 mg every two weeks subcutaneously    Ventolin Hfa 108 (90 Base) Mcg/act Aers (Albuterol sulfate) .Marland Kitchen..Marland Kitchen Two puffs up to four times per day as needed    Prednisone 1 Mg Tabs (Prednisone) .Marland Kitchen... Take as directed    Symbicort 160-4.5 Mcg/act Aero (Budesonide-formoterol fumarate) .Marland Kitchen..Marland Kitchen Two puffs two times a day    Ipratropium-albuterol 0.5-2.5 (3) Mg/7ml Soln  (Ipratropium-albuterol) ..... One vial nebulized four times per day as needed  Problem # 2:  NEUROGENIC BLADDER (ZOX-096.04) Assessment: Unchanged wants to wait on further attempts for self intermittent caths  Problem # 3:  RHEUMATOID ARTHRITIS (ICD-714.0) Assessment: Comment Only hands quiet today seems to be under reasonable control  The following medications were removed from the medication list:    Prednisone 5 Mg/42ml Soln (Prednisone) .Marland KitchenMarland KitchenMarland KitchenMarland Kitchen 4 pills once daily for 3 days, 3 pills once daily for 3 days, 2 pills once daily for 3 days, 1 pill once daily for 3 days His updated medication list for this problem includes:    Methotrexate 2.5 Mg Tabs (Methotrexate sodium) .Marland Kitchen... 9 tablets weekly for rheumatoid arthritis    Adult Aspirin Ec Low Strength 81 Mg Tbec (Aspirin) .Marland Kitchen... Take 1 tablet by mouth once a day    Prednisone 1 Mg Tabs (Prednisone) .Marland Kitchen... Take as directed  Problem # 4:  DEPRESSION (ICD-311) Assessment: Unchanged stable mood now frustrated by medical issues  His updated medication list for this problem includes:    Alprazolam 0.5 Mg Tabs (Alprazolam) .Marland Kitchen... 1-2 tabs by mouth three times a day as needed for nerves    Mirtazapine 15 Mg Tabs (Mirtazapine) .Marland Kitchen... 1 tab at bedtime for depression and poor eating  Complete Medication List: 1)  Hydrocodone-acetaminophen 5-325 Mg Tabs (Hydrocodone-acetaminophen) .Marland Kitchen.. 1 up to four times daily for pain 2)  Xolair 150 Mg Solr (Omalizumab) .... 375 mg every two weeks subcutaneously 3)  Ventolin Hfa 108 (90 Base) Mcg/act Aers (Albuterol sulfate) .... Two puffs up to four times per day as needed 4)  Methotrexate 2.5 Mg Tabs (Methotrexate sodium) .... 9 tablets weekly for rheumatoid arthritis 5)  Metoprolol Succinate 50 Mg Xr24h-tab (Metoprolol succinate) .Marland Kitchen.. 1 tab daily for high blood pressure 6)  Temazepam 15 Mg Caps (Temazepam) .... Take 1or 2 at bedtime as needed to help sleep 7)  Alprazolam 0.5 Mg Tabs (Alprazolam) .Marland Kitchen.. 1-2 tabs by  mouth three times a day as needed for nerves 8)  Omeprazole 20 Mg Tbec (Omeprazole) .... Take 1 by mouth once daily 9)  Mirtazapine 15 Mg Tabs (Mirtazapine) .Marland Kitchen.. 1 tab at bedtime for depression and poor eating 10)  Pravastatin Sodium 20 Mg Tabs (Pravastatin sodium) .Marland Kitchen.. 1 tab daily for high cholesterol 11)  Hydroxychloroquine Sulfate 200 Mg Tabs (Hydroxychloroquine sulfate) .Marland Kitchen.. 1 tab by mouth two times a day for rheumatoid arthritis 12)  Gabapentin 300 Mg Caps (Gabapentin) .Marland Kitchen.. 1 tab by mouth three times a day for neuropathy 13)  Adult Aspirin Ec Low Strength 81 Mg Tbec (Aspirin) .... Take 1 tablet by mouth once a day 14)  Multivitamins Tabs (Multiple vitamin) .... Take 1 tablet by mouth once a day 15)  Miralax Powd (Polyethylene glycol 3350) .Marland KitchenMarland KitchenMarland Kitchen 17 grams in 8 oz of water 1-2 x daily 16)  Dulcolax Stool Softener 100 Mg Caps (Docusate sodium) .... 2 by mouth two times a day 17)  Vesicare 5 Mg Tabs (Solifenacin succinate) .... Take one by mouth daily 18)  Prednisone 1 Mg Tabs (Prednisone) .... Take as directed 19)  Amoxicillin-pot Clavulanate 875-125 Mg Tabs (Amoxicillin-pot clavulanate) .Marland Kitchen.. 1 tab by mouth two times a day after food for sinus infection 20)  Symbicort 160-4.5 Mcg/act Aero (Budesonide-formoterol fumarate) .... Two puffs two times a day 21)  Ipratropium-albuterol 0.5-2.5 (3) Mg/55ml Soln (Ipratropium-albuterol) .... One vial nebulized four times per day as needed  Patient Instructions: 1)  Please schedule a follow-up appointment in 2-3  weeks.    Orders Added: 1)  Est. Patient Level IV [74259]    Current Allergies (reviewed today): ! CIPRO ! ALTACE ! ZOCOR ! CELEXA ! ATIVAN ! PAXIL

## 2010-12-28 NOTE — Letter (Signed)
Summary: Dr.G.Lyna Poser  Dr.G.Wallace [facility],Rheumatology,Note   Imported By: Beau Fanny 02/26/2010 16:28:19  _____________________________________________________________________  External Attachment:    Type:   Image     Comment:   External Document  Appended Document: Dr.G.Earlene Plater [facility],Rheumatology,Note starting MTX 15mg  weekly

## 2010-12-28 NOTE — Assessment & Plan Note (Signed)
Summary: 1 M F/U DLO   30 MIN   Vital Signs:  Patient profile:   77 year old male Weight:      190 pounds Temp:     98.2 degrees F oral Pulse rate:   68 / minute Pulse rhythm:   regular BP sitting:   110 / 60  (left arm) Cuff size:   large  Vitals Entered By: Mervin Hack CMA [name]) (May 03, 2010 12:08 PM) CC: 1 month follow-up   History of Present Illness: Reveiwing urinary problems Urethral procedure about 6 weeks ago recurrent catheterizations needed Still with Foley at this time Dr Achilles Dunk feels that bladder dysfunction may be the problem Has procedure scheduled 6/17 and may need "bladder pacemaker"  Bowels are "still screwed up" Using miralax and sennakot two times a day to three times a day  Often moves bowels daily but all liquid stools Has ongoing left sided abdominal pain Has GI appt but not until July  Hands and feet feel cold abnormal sensation as well  Had  3 episodes  of dizziness and blurred vision twice in restaurants, once at home notes some bright flashing lights Put his head down and gradually it passed did see Dr Fransico Michael 3 days ago--eyes are healthy  Allergies: 1)  ! Cipro 2)  ! Altace 3)  ! Zocor 4)  ! Celexa 5)  ! Ativan 6)  ! Paxil  Past History:  Past medical, surgical, family and social histories (including risk factors) reviewed for relevance to current acute and chronic problems.  Past Medical History: Reviewed history from 04/07/2010 and no changes required. CAD Asthma      - PFT 04/07/10 FEV1 2.61(86%), FVC 3.60(78%), FEV1% 73, TLC 7.26(105%), DLCO 112%, no BD      - RAST and IgE check on 07/23/09.  His IgE was 527.7. Chronic sinusitis Diverticulosis GERD Rheumatoid arthritis Prostate Cancer  Irritable bowel syndrome Anxiety Neurodermatitis Erectile dysfunction Obstructive sleep apnea      - BPAP 14/10 (12/09/09) Allergic rhinitis Hypertension  Past Surgical History: Reviewed history from 03/26/2010 and no changes  required. Radical prostatectomy Left knee arthroscopy Sinus surgery 2009 (deviated septum/polyps) Urethral stricture repair  4/11  Dr Achilles Dunk  Family History: Reviewed history from 05/04/2009 and no changes required. Son - asthma, allergies Mother-deceased at age 43, heart failure Father-deceased at age 75 pneumonia 4 sisters healthy  Social History: Reviewed history from 05/12/2008 and no changes required. 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   Neck:  supple, no masses, no thyromegaly, no carotid bruits, and no cervical lymphadenopathy.   Lungs:  normal respiratory effort and normal breath sounds.   Heart:  normal rate, regular rhythm, no murmur, and no gallop.   Abdomen:  soft.  Mild tenderness along lower left ribs--very vague Extremities:  no edema Neurologic:  alert & oriented X3.   No focal weakness Psych:  dysphoric affect and moderately anxious.     Impression & Recommendations:  Problem # 1:  DIZZINESS (ICD-780.4) Assessment New will stop cozaar and reduce metoprolol  Problem # 2:  NEUROPATHY (ICD-355.9) Assessment: New  abnormal sensation in feet will check labs  Orders: TLB-B12, Serum-Total ONLY (82607-B12) TLB-T4 (Thyrox), Free [id number])  Problem # 3:  LOSS OF WEIGHT (WUJ-811.91) Assessment: New  ongoing  weight loss but actually has accelerated will try to push up GI eval check labs  Orders: TLB-CBC Platelet - w/Differential (85025-CBCD) TLB-Hepatic/Liver Function Pnl (80076-HEPATIC) TLB-Renal Function Panel (80069-RENAL) Venipuncture (16109)  Problem # 4:  RHEUMATOID ARTHRITIS (ICD-714.0) Assessment: Improved he feels his joints are better on MTX no sure any of his problems could be attrributable  to this  His updated medication list for this problem includes:    Methotrexate 2.5 Mg Tabs (Methotrexate sodium) .Marland Kitchen... 8 tablets weekly for rheumatoid arthritis    Prednisone 5 Mg Tabs (Prednisone) .Marland Kitchen... Take 1 1/2 by mouth once daily    Adult Aspirin Ec Low Strength 81 Mg Tbec (Aspirin) .Marland Kitchen... Take 1 tablet by mouth once a day  Problem # 5:  ABDOMINAL PAIN, LEFT LOWER QUADRANT (ICD-789.04) Assessment: Comment Only no up higher vague will work on GI eval  Complete Medication List: 1)  Symbicort 160-4.5 Mcg/act Aero (Budesonide-formoterol fumarate) .... Use 2 puffs two times a day as needed 2)  Singulair 10 Mg Tabs (Montelukast sodium) .Marland Kitchen.. 1 at bedtime 3)  Albuterol Sulfate (2.5 Mg/19ml) 0.083% Nebu (Albuterol sulfate) .... One vial nebulized up to four times per day as needed 4)  Ventolin Hfa 108 (90 Base) Mcg/act Aers (Albuterol sulfate) .... Two puffs up to four times per day as needed 5)  Methotrexate 2.5 Mg Tabs (Methotrexate sodium) .... 8 tablets weekly for rheumatoid arthritis 6)  Prednisone 5 Mg Tabs (Prednisone) .... Take 1 1/2 by mouth once daily 7)  Toprol Xl 100 Mg Xr24h-tab (Metoprolol succinate) .... Take 1/2  tablet by mouth once a day 8)  Temazepam 15 Mg Caps (Temazepam) .... Take 1or 2 at bedtime as needed to help sleep 9)  Epipen 2-pak 0.3 Mg/0.46ml Devi (Epinephrine) .... Use as directed as needed for severe allergic reaction 10)  Alprazolam 0.5 Mg Tabs (Alprazolam) .Marland Kitchen.. 1-2 tabs by mouth three times a day as needed for nerves 11)  Adult Aspirin Ec Low Strength 81 Mg Tbec (Aspirin) .... Take 1 tablet by mouth once a day 12)  Multivitamins Tabs (Multiple vitamin) .... Take 1 tablet by mouth once a day  Other Orders: TLB-PSA (Prostate Specific Antigen) (84153-PSA)  Patient Instructions: 1)  Please schedule a follow-up appointment in 1 month.   Current Allergies (reviewed today): ! CIPRO ! ALTACE ! ZOCOR ! CELEXA ! ATIVAN ! PAXIL

## 2010-12-28 NOTE — Progress Notes (Signed)
Summary: Yeast in his mouth  Phone Note Call from Patient Call back at Home Phone [phone]   Caller: Patient Call For: Cindee Salt MD Summary of Call: Patient would like a Rx sent in to [facility], he states that he has yeast in his mouth and would like something for it.  Please advise. Initial call taken by: Linde Gillis CMA [name]),  October 29, 2010 7:48 AM  Follow-up for Phone Call        Okay Duke's mouthwash 5cc swish and spit four times daily till resolved #16 ounces x 2 Follow-up by: Cindee Salt MD,  October 29, 2010 7:52 AM  Additional Follow-up for Phone Call Additional follow up Details #1::        Rx called to pharmacy and patient notified via telephone. Additional Follow-up by: Linde Gillis CMA [name]),  October 29, 2010 8:38 AM    New/Updated Medications: FIRST-DUKES MOUTHWASH  SUSP (DIPHENHYD-HYDROCORT-NYSTATIN) 5cc swish and spit four times daily till resolved Prescriptions: FIRST-DUKES MOUTHWASH  SUSP (DIPHENHYD-HYDROCORT-NYSTATIN) 5cc swish and spit four times daily till resolved  #16 ounces x 2   Entered by:   Linde Gillis CMA (AAMA)   Authorized by:   Cindee Salt MD   Signed by:   Linde Gillis CMA (AAMA) on 10/29/2010   Method used:   Telephoned to ...       [facility] (retail)       [address]       [place], [place]    [place]       Ph: [phone]       Fax: [phone]   RxID:   [id number]   Current Allergies (reviewed today): ! CIPRO ! ALTACE ! ZOCOR ! CELEXA ! ATIVAN ! PAXIL  Appended Document: Dr.G.Earlene Plater [facility],Rheumatology,Note left shoulder injected increased MTX to 10mg  weekly

## 2010-12-28 NOTE — Progress Notes (Signed)
Summary: alprazolam   Phone Note Refill Request Message from:  Fax from Pharmacy on October 06, 2010 10:04 AM  Refills Requested: Medication #1:  ALPRAZOLAM 0.5 MG TABS 1-2 tabs by mouth three times a day as needed for nerves   Last Refilled: 08/10/2010 Refill request from [facility] pharmacy. [phone].   Initial call taken by: Melody Comas,  October 06, 2010 10:05 AM  Follow-up for Phone Call        Okay #100 x 0 Follow-up by: Cindee Salt MD,  October 06, 2010 12:00 PM  Additional Follow-up for Phone Call Additional follow up Details #1::        Rx called to pharmacy Additional Follow-up by: DeShannon Smith CMA [name]),  October 06, 2010 4:16 PM    Prescriptions: ALPRAZOLAM 0.5 MG TABS (ALPRAZOLAM) 1-2 tabs by mouth three times a day as needed for nerves  #100 x 0   Entered by:   Mervin Hack CMA (AAMA)   Authorized by:   Cindee Salt MD   Signed by:   Mervin Hack CMA (AAMA) on 10/06/2010   Method used:   Telephoned to ...       [facility]* (retail)       [address]       [place], [place]  [address]       Ph: [phone]       Fax: [phone]   RxID:   [id number]   Appended Document: alprazolam  rx was sent for the wrong patient, Ryott Rafferty did not request the rx, Cherylin Mylar did, rx was canceled at [facility] with Tawanna Cooler, pharmacist.  Appended Document: alprazolam  noted

## 2010-12-28 NOTE — Assessment & Plan Note (Signed)
Summary: ER follow up   Vital Signs:  Patient profile:   77 year old male Weight:      193 pounds Temp:     98.3 degrees F Pulse rate:   60 / minute BP sitting:   128 / 70  History of Present Illness: went to [facility]  yesterday for the 3 MRIs ordered by Dr Luanna Cole, back and pelvis. Couldn't do the pelvis one though He noted redness, swelling, heat and pain to MRI tech Had started about 1-2 days before sent to ER Diagnosed with cellulitis after ultrasound was negative for DVT started on clindamycin 300mg  three times a day   Much better today not nearly as hot redness and pain are better      Allergies: 1)  ! Cipro 2)  ! Altace 3)  ! Zocor 4)  ! Celexa 5)  ! Ativan 6)  ! Paxil  Review of Systems       tolerating the clinda no diarrhea but bowels still loose at times with the miralax,etc   Physical Exam  General:  alert.  NAD Skin:  Most of medial left calf within lines drawn for the infection in ER Not particularly warm very mild erythema No tenderness   Impression & Recommendations:  Problem # 1:  CELLULITIS, LEFT LEG (ICD-682.6) Assessment New clearly improved with the clinda advised him to finish the clinda and call if any worsening  Complete Medication List: 1)  Levsin/sl 0.125 Mg Subl (Hyoscyamine sulfate) .Marland Kitchen.. 1-2 tablets by mouth under tongue or by mouth four times a day as needed 2)  Symbicort 160-4.5 Mcg/act Aero (Budesonide-formoterol fumarate) .... 2 puffs two times a day 3)  Xolair 150 Mg Solr (Omalizumab) .... 375 mg every two weeks subcutaneously 4)  Albuterol Sulfate (2.5 Mg/21ml) 0.083% Nebu (Albuterol sulfate) .... One vial nebulized up to four times per day as needed 5)  Ventolin Hfa 108 (90 Base) Mcg/act Aers (Albuterol sulfate) .... Two puffs up to four times per day as needed 6)  Methotrexate 2.5 Mg Tabs (Methotrexate sodium) .... 9 tablets weekly for rheumatoid arthritis 7)  Prednisone 5 Mg Tabs (Prednisone) .... Take 1  by mouth  once daily 8)  Metoprolol Succinate 50 Mg Xr24h-tab (Metoprolol succinate) .Marland Kitchen.. 1 tab daily for high blood pressure 9)  Adult Aspirin Ec Low Strength 81 Mg Tbec (Aspirin) .... Take 1 tablet by mouth once a day 10)  Temazepam 15 Mg Caps (Temazepam) .... Take 1or 2 at bedtime as needed to help sleep 11)  Alprazolam 0.5 Mg Tabs (Alprazolam) .Marland Kitchen.. 1-2 tabs by mouth three times a day as needed for nerves 12)  Multivitamins Tabs (Multiple vitamin) .... Take 1 tablet by mouth once a day 13)  Miralax Powd (Polyethylene glycol 3350) .Marland KitchenMarland KitchenMarland Kitchen 17 grams in 8 oz of water 1-2 x daily 14)  Epipen 2-pak 0.3 Mg/0.100ml Devi (Epinephrine) .... Use as directed as needed for severe allergic reaction 15)  Omeprazole 20 Mg Tbec (Omeprazole) .... Take 1 by mouth once daily 16)  Mirtazapine 15 Mg Tabs (Mirtazapine) .Marland Kitchen.. 1 tab at bedtime for depression and poor eating 17)  Dulcolax Stool Softener 100 Mg Caps (Docusate sodium) .... 2 by mouth two times a day 18)  Pravastatin Sodium 20 Mg Tabs (Pravastatin sodium) .Marland Kitchen.. 1 tab daily for high cholesterol 19)  Hydroxychloroquine Sulfate 200 Mg Tabs (Hydroxychloroquine sulfate) .Marland Kitchen.. 1 tab by mouth two times a day for rheumatoid arthritis 20)  Gabapentin 300 Mg Caps (Gabapentin) .Marland Kitchen.. 1 tab by mouth three  times a day for neuropathy  Patient Instructions: 1)  Please keep regular follow up appt  Appended Document: Orders Update     Clinical Lists Changes  Orders: Added new Service order of Est. Patient Level III (26948) - Signed

## 2010-12-28 NOTE — Progress Notes (Signed)
  Phone Note Other Incoming   Caller: Dr Enedina Finner     -- [facility] ER Summary of Call: there today with urinary retention had to put Foley in again still focused on bowels---though he seems clean on KUB  will refer back to GI for eval Initial call taken by: Cindee Salt MD,  Apr 23, 2010 8:39 AM

## 2010-12-28 NOTE — Progress Notes (Signed)
Summary: ? about pain med  Phone Note Call from Patient Call back at [phone]   Caller: Patient Call For: Cindee Salt MD Summary of Call: Patient has been taking Hydrocodone for pain and he has run out of these.  Patient has some of his wife's pain med which you gave her. He has talked with the pharmacist and was told that they are exactly the same thing. Patient wants to know how often he can  take those during a 24 hour period of time? Please advise. Initial call taken by: Sydell Axon LPN,  July 29, 2010 1:50 PM  Follow-up for Phone Call        okay to give Rx for hydrocodone/APAP  5/325 1 up to four times daily for pain #60 x 0 Follow-up by: Cindee Salt MD,  July 29, 2010 2:27 PM  Additional Follow-up for Phone Call Additional follow up Details #1::        Spoke with patient's wife and advised results. Rx sent to pharmacy Additional Follow-up by: DeShannon Katrinka Blazing CMA [name]),  July 29, 2010 5:07 PM    New/Updated Medications: HYDROCODONE-ACETAMINOPHEN 5-325 MG TABS (HYDROCODONE-ACETAMINOPHEN) 1 up to four times daily for pain Prescriptions: HYDROCODONE-ACETAMINOPHEN 5-325 MG TABS (HYDROCODONE-ACETAMINOPHEN) 1 up to four times daily for pain  #60 x 0   Entered by:   Mervin Hack CMA (AAMA)   Authorized by:   Cindee Salt MD   Signed by:   Mervin Hack CMA (AAMA) on 07/29/2010   Method used:   Telephoned to ...       [facility] [address] (retail)       [address]       [place], [place]  [address]       Ph: [phone]       Fax: [phone]   RxID:   [id number]

## 2010-12-28 NOTE — Letter (Signed)
Summary: [facility]  [facility]   Imported By: Maryln Gottron 04/01/2010 10:37:45  _____________________________________________________________________  External Attachment:    Type:   Image     Comment:   External Document

## 2010-12-28 NOTE — Progress Notes (Signed)
Summary: itching from meds/ xoalir question  Phone Note Call from Patient   Caller: Patient Call For: sood Summary of Call: pt has a UTI and is currently being treated for this. he is now Itching (from meds/ sulfa) and wants to know if it is still ok to have his xolair shot today or should he postpone this. call asap as he has to see his pcp soon today. cell [phone] Initial call taken by: Tivis Ringer, CNA,  February 25, 2010 9:05 AM  Follow-up for Phone Call        SOOD PT--Pt states his PCP gave him Sulfa abx for UTI on 02/22/10. he states he began to itch from the medication so he has stoppd taking it, but he wants to know will this have any effect on him getting his xolair today? He states he really doesn't see any rash on his body, just that it itches.  Please advise. Carron Curie CMA  February 25, 2010 9:35 AM   Additional Follow-up for Phone Call Additional follow up Details #1::        OK foir Xolair- should not interfere Additional Follow-up by: Waymon Budge MD,  February 25, 2010 10:42 AM    Additional Follow-up for Phone Call Additional follow up Details #2::    pt advised. Carron Curie CMA  February 25, 2010 10:48 AM

## 2010-12-28 NOTE — Progress Notes (Signed)
Summary: Itching  Phone Note From Other Clinic   Caller: [facility] Call For: Dashea Mcmullan Summary of Call: calling to advise that pt stopped abx for UTI because of itching, pt is scheduled to see Urologist 02/26/2010, pt will still get his Xolair shot. Initial call taken by: Mervin Hack CMA [name]),  February 25, 2010 4:14 PM  Follow-up for Phone Call        okay He had negative U/A so 1 or 2 doses may have been sufficient Follow-up by: Cindee Salt MD,  February 25, 2010 5:10 PM

## 2010-12-28 NOTE — Assessment & Plan Note (Signed)
Summary: ROA FOR 1 WEEK FOLLOW-UP/JRR 1:45   Vital Signs:  Patient profile:   77 year old male Weight:      193 pounds Temp:     98.3 degrees F oral Pulse rate:   80 / minute Pulse rhythm:   regular BP sitting:   140 / 78  (left arm) Cuff size:   regular  Vitals Entered By: Janee Morn CMA (June 24, 2010 2:15 PM) CC: 1 week follow u p   History of Present Illness: Still has foley catheter Not happy about this but has another procedure next week---bladder function Still occ bloody string will come out of the foley---has been reassured by Dr Achilles Dunk  Has appt with neurologist next week also  Doing his best to eat more mirtazapine seems to have helped Appetite is really better Mood seems to be perhaps slightly better  Arthritis seemed better when started MTX but recently with more back pain of late  Bowels are still not satisfying Only gets good clean out on the 2 days per week he takes magnesium citrate Never has really solid stools  Allergies: 1)  ! Cipro 2)  ! Altace 3)  ! Zocor 4)  ! Celexa 5)  ! Ativan 6)  ! Paxil  Past History:  Past medical, surgical, family and social histories (including risk factors) reviewed for relevance to current acute and chronic problems.  Past Medical History: Reviewed history from 06/14/2010 and no changes required. CAD Asthma      - PFT 04/07/10 FEV1 2.61(86%), FVC 3.60(78%), FEV1% 73, TLC 7.26(105%), DLCO 112%, no BD      - RAST and IgE check on 07/23/09.  His IgE was 527.7. Chronic sinusitis Diverticulosis GERD Rheumatoid arthritis Prostate Cancer  Irritable bowel syndrome Anxiety Neurodermatitis Erectile dysfunction Obstructive sleep apnea      - BPAP 14/10 (12/09/09)>>stopped after weight loss Allergic rhinitis Hypertension Adenomatous Colon Polyps 1990 Depression  Past Surgical History: Reviewed history from 03/26/2010 and no changes required. Radical prostatectomy Left knee arthroscopy Sinus surgery 2009  (deviated septum/polyps) Urethral stricture repair  4/11  Dr Achilles Dunk  Family History: Reviewed history from 05/04/2009 and no changes required. Son - asthma, allergies Mother-deceased at age 43, heart failure Father-deceased at age 16 pneumonia 4 sisters healthy  Social History: Reviewed history from 05/12/2008 and no changes required. 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 Never Smoked Alcohol use-occasional Regular exercise-no  Review of Systems       weight is up 6# Breathing is better sleeping fair  Physical Exam  General:  NAD Neck:  supple, no masses, no thyromegaly, and no cervical lymphadenopathy.   Lungs:  normal respiratory effort, no intercostal retractions, no accessory muscle use, normal breath sounds, and no wheezes.   Heart:  normal rate, regular rhythm, no murmur, and no gallop.   Abdomen:  soft and non-tender.   Extremities:  no edema Psych:  normally interactive, good eye contact, dysphoric affect, and slightly anxious.     Impression & Recommendations:  Problem # 1:  DEPRESSION (ICD-311) Assessment Improved perhaps slightly better on mirtazapine slight relief that he has been able to gain weight continue these meds  His updated medication list for this problem includes:    Alprazolam 0.5 Mg Tabs (Alprazolam) .Marland Kitchen... 1-2 tabs by mouth three times a day as needed for nerves    Mirtazapine 15 Mg Tabs (Mirtazapine) .Marland Kitchen... 1 tab at bedtime for depression and poor eating  Problem # 2:  NEUROGENIC BLADDER (EAV-409.81) Assessment: Comment Only awaiting  neuro eval and further urology testing still frustrated with foley  Problem # 3:  ASTHMA (ICD-493.90) Assessment: Unchanged still fairly well controlled will wean the prednisone per Dr Guillermina [place] instructions  His updated medication list for this problem includes:    Symbicort 160-4.5 Mcg/act Aero (Budesonide-formoterol fumarate) .Marland Kitchen... 2 puffs two times a day    Albuterol Sulfate (2.5 Mg/66ml)  0.083% Nebu (Albuterol sulfate) ..... One vial nebulized up to four times per day as needed    Ventolin Hfa 108 (90 Base) Mcg/act Aers (Albuterol sulfate) .Marland Kitchen..Marland Kitchen Two puffs up to four times per day as needed    Prednisone 5 Mg Tabs (Prednisone) .Marland Kitchen... Take 1  by mouth once daily  Complete Medication List: 1)  Levsin/sl 0.125 Mg Subl (Hyoscyamine sulfate) .Marland Kitchen.. 1-2 tablets by mouth under tongue or by mouth four times a day as needed 2)  Symbicort 160-4.5 Mcg/act Aero (Budesonide-formoterol fumarate) .... 2 puffs two times a day 3)  Albuterol Sulfate (2.5 Mg/52ml) 0.083% Nebu (Albuterol sulfate) .... One vial nebulized up to four times per day as needed 4)  Ventolin Hfa 108 (90 Base) Mcg/act Aers (Albuterol sulfate) .... Two puffs up to four times per day as needed 5)  Methotrexate 2.5 Mg Tabs (Methotrexate sodium) .... 9 tablets weekly for rheumatoid arthritis 6)  Prednisone 5 Mg Tabs (Prednisone) .... Take 1  by mouth once daily 7)  Metoprolol Succinate 50 Mg Xr24h-tab (Metoprolol succinate) .Marland Kitchen.. 1 tab daily for high blood pressure 8)  Adult Aspirin Ec Low Strength 81 Mg Tbec (Aspirin) .... Take 1 tablet by mouth once a day 9)  Temazepam 15 Mg Caps (Temazepam) .... Take 1or 2 at bedtime as needed to help sleep 10)  Alprazolam 0.5 Mg Tabs (Alprazolam) .Marland Kitchen.. 1-2 tabs by mouth three times a day as needed for nerves 11)  Multivitamins Tabs (Multiple vitamin) .... Take 1 tablet by mouth once a day 12)  Miralax Powd (Polyethylene glycol 3350) .Marland KitchenMarland KitchenMarland Kitchen 17 grams in 8 oz of water 1-2 x daily 13)  Epipen 2-pak 0.3 Mg/0.57ml Devi (Epinephrine) .... Use as directed as needed for severe allergic reaction 14)  Omeprazole 20 Mg Tbec (Omeprazole) .... Take 1 by mouth once daily 15)  Mirtazapine 15 Mg Tabs (Mirtazapine) .Marland Kitchen.. 1 tab at bedtime for depression and poor eating 16)  Dulcolax Stool Softener 100 Mg Caps (Docusate sodium) .... 2 by mouth two times a day 17)  Pravastatin Sodium 20 Mg Tabs (Pravastatin sodium) .Marland Kitchen.. 1  tab daily for high cholesterol 18)  Hydroxychloroquine Sulfate 200 Mg Tabs (Hydroxychloroquine sulfate) .Marland Kitchen.. 1 tab by mouth two times a day for rheumatoid arthritis  Patient Instructions: 1)  Please schedule a follow-up appointment in 1 month.   Current Allergies (reviewed today): ! CIPRO ! ALTACE ! ZOCOR ! CELEXA ! ATIVAN ! PAXIL

## 2010-12-28 NOTE — Assessment & Plan Note (Signed)
Summary: 2 WKS XOLAIR///KP   Nurse Visit   Allergies: 1)  ! Cipro 2)  ! Altace 3)  ! Zocor 4)  ! Celexa 5)  ! Ativan 6)  ! Paxil  Medication Administration  Injection # 1:    Medication: Xolair (omalizumab) 150mg     Diagnosis: EXTRINSIC ASTHMA, UNSPECIFIED (ICD-493.00)    Route: IM    Site: R deltoid    Exp Date: 08/28/2013    Lot #: [id number]    Mfr: [employer]etech    Comments: xolair 225 mg, 60 units 0.9 ml x 1 Left deltoid + 0.9 ml x 2 in right deltoid. Pt waited 20 mins.    Given by: Drucie Opitz, CMA  Orders Added: 1)  Administration xolair injection [id number]

## 2010-12-28 NOTE — Progress Notes (Signed)
Summary: Rx Temazepam  Phone Note From Pharmacy Call back at [phone]   Caller: [facility]  [address] Call For: Dr. Alphonsus Sias  Summary of Call: Patient called to pharmacy wanting an early refill on Temazepam 15mg .  Pharmacy says this must be ok'ed by MD first before a refill will be given.  Please advise. Initial call taken by: Linde Gillis CMA [name]),  June 07, 2010 2:45 PM  Follow-up for Phone Call        okay to refill early he has been through a lot Follow-up by: Cindee Salt MD,  June 07, 2010 5:31 PM  Additional Follow-up for Phone Call Additional follow up Details #1::        spoke with pharmacist and advised early refill. Additional Follow-up by: Mervin Hack CMA [name]),  June 07, 2010 5:41 PM

## 2010-12-28 NOTE — Letter (Signed)
Summary: [facility]   Imported By: Maryln Gottron 03/08/2010 11:08:53  _____________________________________________________________________  External Attachment:    Type:   Image     Comment:   External Document  Appended Document: [facility] may need intervention for  bladder neck contracture

## 2010-12-28 NOTE — Progress Notes (Signed)
Summary: pt needs order for walker and lift chair  Phone Note Call from Patient Call back at Home Phone [phone]   Caller: Spouse Summary of Call: Pt wants to get a lift chair from [facility].  He will need an order for this.  Also, they have already bought a walker from [facility] and they need an order for that as well, so that they can be refunded some of the cost.  Please call when orders are ready and pt's wife will pick up. Initial call taken by: Lowella Petties CMA,  January 19, 2010 4:28 PM  Follow-up for Phone Call        Rx's written Follow-up by: Cindee Salt MD,  January 20, 2010 10:28 AM  Additional Follow-up for Phone Call Additional follow up Details #1::        Spoke with patient's wife and advised order ready for pick-up  Additional Follow-up by: Mervin Hack CMA [name]),  January 20, 2010 12:23 PM

## 2010-12-28 NOTE — Assessment & Plan Note (Signed)
Summary: SOB, USING INHALER MORE THAN NORMAL   CYD   Vital Signs:  Patient profile:   77 year old male Weight:      204 pounds O2 Sat:      97 % on Room air Temp:     98.1 degrees F oral Pulse rate:   78 / minute Pulse rhythm:   regular Resp:     14 per minute BP sitting:   148 / 80  (left arm) Cuff size:   large  Vitals Entered By: Mervin Hack CMA [name]) (October 18, 2010 4:38 PM)  O2 Flow:  Room air CC: cough, congestion   History of Present Illness: Has a sinus and bronchial infection Wife had infection a couple of weeks ago  Doesn't seem like his asthma has flared still not needing the nebulizer  Having yellow and brown nasal drainage still does daily nasal washes occ mucus with deep cough  started about 4-5 days ago only SOB briefly yesterday--did use nebulizer once  Lots of cough at night---affecting his sleep  no fever Does get cold a lot with chills--but nothing different now  Allergies: 1)  ! Cipro 2)  ! Altace 3)  ! Zocor 4)  ! Celexa 5)  ! Ativan 6)  ! Paxil  Past History:  Past medical, surgical, family and social histories (including risk factors) reviewed for relevance to current acute and chronic problems.  Past Medical History: Reviewed history from 06/14/2010 and no changes required. CAD Asthma      - PFT 04/07/10 FEV1 2.61(86%), FVC 3.60(78%), FEV1% 73, TLC 7.26(105%), DLCO 112%, no BD      - RAST and IgE check on 07/23/09.  His IgE was 527.7. Chronic sinusitis Diverticulosis GERD Rheumatoid arthritis Prostate Cancer  Irritable bowel syndrome Anxiety Neurodermatitis Erectile dysfunction Obstructive sleep apnea      - BPAP 14/10 (12/09/09)>>stopped after weight loss Allergic rhinitis Hypertension Adenomatous Colon Polyps 1990 Depression  Past Surgical History: Reviewed history from 03/26/2010 and no changes required. Radical prostatectomy Left knee arthroscopy Sinus surgery 2009 (deviated septum/polyps) Urethral  stricture repair  4/11  Dr Achilles Dunk  Family History: Reviewed history from 05/04/2009 and no changes required. Son - asthma, allergies Mother-deceased at age 92, heart failure Father-deceased at age 75 pneumonia 4 sisters healthy  Social History: Reviewed history from 05/12/2008 and no changes required. 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   Head:  Mild frontal tenderness but no maxillary tenderness Ears:  R ear normal and L ear normal.   Nose:  mild congestion on left, more on right with some thick mucus Mouth:  no erythema and no exudates.   Neck:  supple, no masses, and no cervical lymphadenopathy.   Lungs:  normal respiratory effort, no intercostal retractions, and no accessory muscle use.  Mild exp wheezes but not really tight   Impression & Recommendations:  Problem # 1:  SINUSITIS - ACUTE-NOS (ICD-461.9) Assessment New  seems to have his usual bacterial infection will treat with augmentin  His updated medication list for this problem includes:    Amoxicillin-pot Clavulanate 875-125 Mg Tabs (Amoxicillin-pot clavulanate) .Marland Kitchen... 1 tab by mouth two times a day after food for sinus infection  Problem # 2:  EXTRINSIC ASTHMA, UNSPECIFIED (ICD-493.00) Assessment: Comment Only not seriously exacerbated will have him use the nebulizer has finally almost weaned off the prednisone so will try without a boost If worse over the next few days---- will give brief prednisone flare  His updated medication list for this problem includes:    Symbicort 160-4.5 Mcg/act Aero (Budesonide-formoterol fumarate) .Marland Kitchen... 2 puffs two times a day    Xolair 150 Mg  Solr (Omalizumab) .Marland KitchenMarland KitchenMarland KitchenMarland Kitchen 375 mg every two weeks subcutaneously    Albuterol Sulfate (2.5 Mg/78ml) 0.083% Nebu (Albuterol sulfate) ..... One vial nebulized up to four times per day as needed    Ventolin Hfa 108 (90 Base) Mcg/act Aers (Albuterol sulfate) .Marland Kitchen..Marland Kitchen Two puffs up to four times per day as needed    Prednisone 1 Mg Tabs (Prednisone) .Marland Kitchen... Take as directed    Prednisone 20 Mg Tabs (Prednisone) .Marland Kitchen... 2 tabs daily for 5 days for asthma flare  Problem # 3:  HYPONATREMIA (ICD-276.1) Assessment: Comment Only  will recheck if better, can liberalize fluid restriction  Orders: TLB-Renal Function Panel (80069-RENAL) Fingerstick (04540)  Complete Medication List: 1)  Hydrocodone-acetaminophen 5-325 Mg Tabs (Hydrocodone-acetaminophen) .Marland Kitchen.. 1 up to four times daily for pain 2)  Symbicort 160-4.5 Mcg/act Aero (Budesonide-formoterol fumarate) .... 2 puffs two times a day 3)  Xolair 150 Mg Solr (Omalizumab) .... 375 mg every two weeks subcutaneously 4)  Albuterol Sulfate (2.5 Mg/62ml) 0.083% Nebu (Albuterol sulfate) .... One vial nebulized up to four times per day as needed 5)  Ventolin Hfa 108 (90 Base) Mcg/act Aers (Albuterol sulfate) .... Two puffs up to four times per day as needed 6)  Methotrexate 2.5 Mg Tabs (Methotrexate sodium) .... 9 tablets weekly for rheumatoid arthritis 7)  Metoprolol Succinate 50 Mg Xr24h-tab (Metoprolol succinate) .Marland Kitchen.. 1 tab daily for high blood pressure 8)  Temazepam 15 Mg Caps (Temazepam) .... Take 1or 2 at bedtime as needed to help sleep 9)  Alprazolam 0.5 Mg Tabs (Alprazolam) .Marland Kitchen.. 1-2 tabs by mouth three times a day as needed for nerves 10)  Omeprazole 20 Mg Tbec (Omeprazole) .... Take 1 by mouth once daily 11)  Mirtazapine 15 Mg Tabs (Mirtazapine) .Marland Kitchen.. 1 tab at bedtime for depression and poor eating 12)  Pravastatin Sodium 20 Mg Tabs (Pravastatin sodium) .Marland Kitchen.. 1 tab daily for high cholesterol 13)  Hydroxychloroquine Sulfate 200 Mg Tabs (Hydroxychloroquine sulfate)  .Marland Kitchen.. 1 tab by mouth two times a day for rheumatoid arthritis 14)  Gabapentin 300 Mg Caps (Gabapentin) .Marland Kitchen.. 1 tab by mouth three times a day for neuropathy 15)  Adult Aspirin Ec Low Strength 81 Mg Tbec (Aspirin) .... Take 1 tablet by mouth once a day 16)  Multivitamins Tabs (Multiple vitamin) .... Take 1 tablet by mouth once a day 17)  Miralax Powd (Polyethylene glycol 3350) .Marland KitchenMarland KitchenMarland Kitchen 17 grams in 8 oz of water 1-2 x daily 18)  Dulcolax Stool Softener 100 Mg Caps (Docusate sodium) .... 2 by mouth two times a day 19)  Vesicare 5 Mg Tabs (Solifenacin succinate) .... Take one by mouth daily 20)  Prednisone 1 Mg Tabs (Prednisone) .... Take as directed 21)  Prednisone 20 Mg Tabs (Prednisone) .... 2 tabs daily for 5 days for asthma flare 22)  Amoxicillin-pot Clavulanate 875-125 Mg Tabs (Amoxicillin-pot clavulanate) .Marland Kitchen.. 1 tab by mouth two times a day after food for sinus infection  Patient Instructions: 1)  Keep next week's appt Prescriptions: AMOXICILLIN-POT CLAVULANATE 875-125 MG TABS (AMOXICILLIN-POT CLAVULANATE) 1 tab by mouth two times a day  after food for sinus infection  #20 x 0   Entered by:   Mervin Hack CMA (AAMA)   Authorized by:   Cindee Salt MD   Signed by:   Mervin Hack CMA (AAMA) on 10/18/2010   Method used:   Faxed to ...       [facility] (retail)       [address]       [place], [place]    [place]       Ph: [phone]       Fax: [phone]   RxID:   [id number] PREDNISONE 20 MG TABS (PREDNISONE) 2 tabs daily for 5 days for asthma flare  #10 x 0   Entered by:   Mervin Hack CMA (AAMA)   Authorized by:   Cindee Salt MD   Signed by:   Mervin Hack CMA (AAMA) on 10/18/2010   Method used:   Faxed to ...       [facility] (retail)       [address]       [place], [place]    [place]       Ph: [phone]       Fax: [phone]   RxID:   [id number] AMOXICILLIN-POT CLAVULANATE 875-125 MG TABS (AMOXICILLIN-POT  CLAVULANATE) 1 tab by mouth two times a day after food for sinus infection  #20 x 0   Entered and Authorized by:   Cindee Salt MD   Signed by:   Cindee Salt MD on 10/18/2010   Method used:   Print then Give to Patient   RxID:   [id number] PREDNISONE 20 MG TABS (PREDNISONE) 2 tabs daily for 5 days for asthma flare  #10 x 0   Entered and Authorized by:   Cindee Salt MD   Signed by:   Cindee Salt MD on 10/18/2010   Method used:   Print then Give to Patient   RxID:   [id number]    Orders Added: 1)  Est. Patient Level IV [32202] 2)  TLB-Renal Function Panel [80069-RENAL] 3)  Fingerstick [54270]    Current Allergies (reviewed today): ! CIPRO ! ALTACE ! ZOCOR ! CELEXA ! ATIVAN ! PAXIL

## 2010-12-28 NOTE — Assessment & Plan Note (Signed)
Summary: XOLAIR/KLW   Nurse Visit   Allergies: 1)  ! Cipro 2)  ! Altace 3)  ! Zocor 4)  ! Celexa 5)  ! Ativan 6)  ! Paxil  Medication Administration  Injection # 1:    Medication: Xolair (omalizumab) 150mg     Diagnosis: EXTRINSIC ASTHMA, UNSPECIFIED (ICD-493.00)    Route: SQ    Site: L deltoid    Exp Date: 03/2013    Lot #: [id number]    Mfr: Mendel Ryder    Comments: Injection given by Dimas Millin in allergy lab. Xolair 375mg . 1.38ml x 2 in Left and 1.56ml x 1 in Right Deltoid. Pt waited 30 minutes.  Orders Added: 1)  Admin of Therapeutic Inj  intramuscular or subcutaneous [96372] 2)  Xolair (omalizumab) 150mg  [J2357]

## 2010-12-28 NOTE — Letter (Signed)
Summary: [facility]   Imported By: Lanelle Bal 05/27/2010 10:28:31  _____________________________________________________________________  External Attachment:    Type:   Image     Comment:   External Document

## 2010-12-28 NOTE — Assessment & Plan Note (Signed)
Summary: rov/ xolair/apc   Visit Type:  Follow-up Copy to:  Dr. Andee Poles, Dr. Saverio Danker, Dr. Assunta Gambles Primary Provider/Referring Provider:  Tillman Abide  CC:  The patient is here for follow-up with PFT's. The patient says he had been doing well but has had increased SOB today. He would like ro discuss the Spiriva use and says it is affecting urination.Marland Kitchen  History of Present Illness: 77 year old male with known history of  asthma, OSA on BPAP, and rhinits.  He has been doing better with his breathing.  He is not having much cough, wheeze, or congestion.  He is able to get around better, and do more activities.  He has been started on methotrexate for his arthritis, and had his prednisone dose decreased.  He has been having problems with urination, and there is concern that his spiriva may be contributing to this.  He has not been using his BPAP for the past two weeks because he has been using the bathroom frequently at night.  He has a foley catheter in place at present, and this is to be assessed by urology later this week.  He has lost 25 lbs over the past several months.    Current Medications (verified): 1)  Symbicort 160-4.5 Mcg/act Aero (Budesonide-Formoterol Fumarate) .... Use 2 Puffs Two Times A Day As Needed 2)  Spiriva Handihaler 18 Mcg Caps (Tiotropium Bromide Monohydrate) .Marland Kitchen.. 1 Capsule in Inhaler Daily 3)  Singulair 10 Mg Tabs (Montelukast Sodium) .Marland Kitchen.. 1 At Bedtime 4)  Albuterol Sulfate (2.5 Mg/74ml) 0.083% Nebu (Albuterol Sulfate) .... One Vial Nebulized Up To Four Times Per Day As Needed 5)  Ventolin Hfa 108 (90 Base) Mcg/act Aers (Albuterol Sulfate) .... Two Puffs Up To Four Times Per Day As Needed 6)  Prednisone 5 Mg Tabs (Prednisone) .... Take 1 1/2 By Mouth Once Daily 7)  Toprol Xl 100 Mg Xr24h-Tab (Metoprolol Succinate) .... Take 1 Tablet By Mouth Once A Day 8)  Adult Aspirin Ec Low Strength 81 Mg  Tbec (Aspirin) .... Take 1 Tablet By Mouth Once A Day 9)  Pravachol  20 Mg  Tabs (Pravastatin Sodium) .... Take 1 Tablet By Mouth Once A Day 10)  Temazepam 15 Mg  Caps (Temazepam) .... Take 1or 2 At Bedtime As Needed To Help Sleep 11)  Multivitamins   Tabs (Multiple Vitamin) .... Take 1 Tablet By Mouth Once A Day 12)  Cozaar 50 Mg Tabs (Losartan Potassium) .... Take 1 Tablet By Mouth Once A Day 13)  Furosemide 40 Mg Tabs (Furosemide) .Marland Kitchen.. 1 Daily As Needed For Swelling in Feet and Legs 14)  Epipen 2-Pak 0.3 Mg/0.43ml Devi (Epinephrine) .... Use As Directed As Needed For Severe Allergic Reaction 15)  Alprazolam 0.5 Mg Tabs (Alprazolam) .Marland Kitchen.. 1-2 Tabs By Mouth Three Times A Day As Needed For Nerves 16)  Hyoscyamine Sulfate 0.125 Mg Tabs (Hyoscyamine Sulfate) .... Take 1 By Mouth 4 Times A Day As Needed For Bladder or Intestinal Spasm 17)  Methotrexate 2.5 Mg Tabs (Methotrexate Sodium) .... 8 Tablets Weekly For Rheumatoid Arthritis 18)  Rapaflo 8 Mg Caps (Silodosin) .... Take 1 By Mouth Once Daily With Breakfast  Allergies (verified): 1)  ! Cipro 2)  ! Altace 3)  ! Zocor 4)  ! Celexa 5)  ! Ativan 6)  ! Paxil  Past History:  Past Medical History: CAD Asthma      - PFT 04/07/10 FEV1 2.61(86%), FVC 3.60(78%), FEV1% 73, TLC 7.26(105%), DLCO 112%, no BD      -  RAST and IgE check on 07/23/09.  His IgE was 527.7. Chronic sinusitis Diverticulosis GERD Rheumatoid arthritis Prostate Cancer  Irritable bowel syndrome Anxiety Neurodermatitis Erectile dysfunction Obstructive sleep apnea      - BPAP 14/10 (12/09/09) Allergic rhinitis Hypertension  Past Surgical History: Reviewed history from 03/26/2010 and no changes required. Radical prostatectomy Left knee arthroscopy Sinus surgery 2009 (deviated septum/polyps) Urethral stricture repair  4/11  Dr Achilles Dunk  Vital Signs:  Patient profile:   77 year old male Height:      72 inches (182.88 cm) Weight:      203 pounds (92.27 kg) BMI:     27.63 O2 Sat:      97 % on Room air Temp:     97.6 degrees F (36.44  degrees C) oral Pulse rate:   91 / minute BP sitting:   110 / 72  (left arm) Cuff size:   regular  Vitals Entered By: Michel Bickers CMA (Apr 07, 2010 12:14 PM)  O2 Sat at Rest %:  97 O2 Flow:  Room air  Physical Exam  General:  thin.   Nose:  no nasal discharge, no sinus tenderness Mouth:  no oral lesion Neck:  no JVD.   Lungs:  no wheezing or rales Heart:  regular rhythm, normal rate, and no murmurs.   Extremities:  mild swollen joints in hands, minimal ankle edema Cervical Nodes:  no significant adenopathy   Impression & Recommendations:  Problem # 1:  ASTHMA (ICD-493.90) He has done well since being started on xolair.  Will stop his spiriva.  He is to continue on his other asthma regimen.  Hopefully, can continue to wean of prednisone.  Problem # 2:  OBSTRUCTIVE SLEEP APNEA (ICD-327.23) He has lost a significant amount of weight.  As a result his sleep apnea may have improved.  Will arrange for auto-BPAP titration to see if he can have his pressure settings decreased.  Problem # 3:  ALLERGIC RHINITIS (ICD-477.9) He is to continue on his sinus regimen.  Problem # 4:  RHEUMATOID ARTHRITIS (ICD-714.0) He has been started on methotrexate.  His prednisone is being adjusted by rheumatology.  Problem # 5:  ADENOCARCINOMA, PROSTATE, S/P PROSTATECTOMY (ICD-185) He had recent urethral dilation.  He has continued difficulty with urination.  Spiriva certainly could be contributing to this with regard to its anticholinergic effect.  Will stop his spiriva as his breathing has improved.  Complete Medication List: 1)  Symbicort 160-4.5 Mcg/act Aero (Budesonide-formoterol fumarate) .... Use 2 puffs two times a day as needed 2)  Singulair 10 Mg Tabs (Montelukast sodium) .Marland Kitchen.. 1 at bedtime 3)  Albuterol Sulfate (2.5 Mg/99ml) 0.083% Nebu (Albuterol sulfate) .... One vial nebulized up to four times per day as needed 4)  Ventolin Hfa 108 (90 Base) Mcg/act Aers (Albuterol sulfate) .... Two puffs  up to four times per day as needed 5)  Methotrexate 2.5 Mg Tabs (Methotrexate sodium) .... 8 tablets weekly for rheumatoid arthritis 6)  Prednisone 5 Mg Tabs (Prednisone) .... Take 1 1/2 by mouth once daily 7)  Toprol Xl 100 Mg Xr24h-tab (Metoprolol succinate) .... Take 1 tablet by mouth once a day 8)  Adult Aspirin Ec Low Strength 81 Mg Tbec (Aspirin) .... Take 1 tablet by mouth once a day 9)  Pravachol 20 Mg Tabs (Pravastatin sodium) .... Take 1 tablet by mouth once a day 10)  Temazepam 15 Mg Caps (Temazepam) .... Take 1or 2 at bedtime as needed to help sleep 11)  Multivitamins Tabs (  Multiple vitamin) .... Take 1 tablet by mouth once a day 12)  Cozaar 50 Mg Tabs (Losartan potassium) .... Take 1 tablet by mouth once a day 13)  Furosemide 40 Mg Tabs (Furosemide) .Marland Kitchen.. 1 daily as needed for swelling in feet and legs 14)  Epipen 2-pak 0.3 Mg/0.31ml Devi (Epinephrine) .... Use as directed as needed for severe allergic reaction 15)  Alprazolam 0.5 Mg Tabs (Alprazolam) .Marland Kitchen.. 1-2 tabs by mouth three times a day as needed for nerves 16)  Hyoscyamine Sulfate 0.125 Mg Tabs (Hyoscyamine sulfate) .... Take 1 by mouth 4 times a day as needed for bladder or intestinal spasm 17)  Rapaflo 8 Mg Caps (Silodosin) .... Take 1 by mouth once daily with breakfast  Other Orders: Est. Patient Level III (45809) DME Referral (DME) Admin of Therapeutic Inj  intramuscular or subcutaneous (98338) Xolair (omalizumab) 150mg  (S5053)  Patient Instructions: 1)  Stop spiriva 2)  Will check BPAP pressure 3)  Follow up in one month   Medication Administration  Injection # 1:    Medication: Xolair (omalizumab) 150mg     Diagnosis: ASTHMA (ICD-493.90)    Route: SQ    Site: right arm    Exp Date: 01/2013    Lot #: [id number]    Mfr: [employer]    Comments: Xolair 375mg , given by Drucie Opitz, CMA in the allergy lab 1.16ml x2 in right arm and 1.71ml in left arm Patient did not wait    Patient tolerated injection without  complications  Orders Added: 1)  Est. Patient Level III [19379] 2)  DME Referral [DME] 3)  Admin of Therapeutic Inj  intramuscular or subcutaneous [96372] 4)  Xolair (omalizumab) 150mg  [J2357]

## 2010-12-28 NOTE — Progress Notes (Signed)
Summary: Constipated  Phone Note Call from Patient Call back at Home Phone [phone]   Caller: Patient Call For: Dr. Russella Dar Summary of Call: Pt calling to advise that he's constipated. Pt states he's doing everything that Dr. Russella Dar advised and it's not working, pt states he not have "good" bowel movements. Pt states it's been 10 days since he had a adequate amount of bowel, pt fills bloated and thinks he's in danger. I advised pt that Dr.Letvak is out the remainder of the week, I advised pt that I would have to send this note to another physican, he then asked if I could send this note to Dr.Stark. Pt has appt with Dr. Alphonsus Sias on Monday 06/14/2010 Initial call taken by: Mervin Hack CMA [name]),  June 09, 2010 9:51 AM  Follow-up for Phone Call        Use Miralax three times a day, decrease for diarrhea Ducolax tablets, 2 by mouth once daily as needed Mag Citrate 1 bottle twice a week if all above is not adequate Further management per PCP Follow-up by: Meryl Dare MD Clementeen Graham,  June 09, 2010 10:09 AM  Additional Follow-up for Phone Call Additional follow up Details #1::        spoke with patient and advised results, pt understood. Pt stated that he's not eating as much, so I told him if he's not eating then he wouldn't have a large amount of bowel, that if he's not putting anything in then nothing is coming out. DeShannon Smith CMA [name])  June 09, 2010 10:26 AM

## 2010-12-28 NOTE — Progress Notes (Signed)
Summary: Alprazolam  Phone Note Refill Request Message from:  [employer]line on January 01, 2010 9:29 AM  Refills Requested: Medication #1:  ALPRAZOLAM 0.5 MG TABS 1-2 tabs by mouth three times a day as needed for nerves. [facility]   Last Fill Date:  11/11/2009   Pharmacy Phone:  [phone]   Method Requested: Telephone to Pharmacy Initial call taken by: Delilah Shan CMA [name]),  January 01, 2010 9:29 AM  Follow-up for Phone Call        Rx called to pharmacy Follow-up by: Linde Gillis CMA [name]),  January 01, 2010 9:56 AM    Prescriptions: ALPRAZOLAM 0.5 MG TABS (ALPRAZOLAM) 1-2 tabs by mouth three times a day as needed for nerves  #100 x 0   Entered and Authorized by:   Kerby Nora MD   Signed by:   Kerby Nora MD on 01/01/2010   Method used:   Telephoned to ...       [facility] (retail)       [address]       [place], [place]  [address]       Ph: [phone]       Fax: [phone]   RxID:   [id number]

## 2010-12-28 NOTE — Letter (Signed)
Summary: CMN for CPAP Supplies/[facility]  CMN for CPAP Supplies/[facility]   Imported By: Sherian Rein 12/28/2009 08:42:59  _____________________________________________________________________  External Attachment:    Type:   Image     Comment:   External Document

## 2010-12-28 NOTE — Assessment & Plan Note (Signed)
Summary: ROA  CYD   Vital Signs:  Patient profile:   77 year old male Weight:      221 pounds Temp:     97.8 degrees F oral Pulse rate:   74 / minute Pulse rhythm:   regular BP sitting:   128 / 80  (left arm) Cuff size:   large  Vitals Entered By: Mervin Hack CMA [name]) (December 23, 2009 10:43 AM) CC: follow-up visit   History of Present Illness: Ongoing knee problems Hard even getting up out of chair Good knee also giving him trouble discussed need for quad strengthening does have appt with Dr Despina Hick  Breathing is better did have trouble one day going out in cold, windy day Hasn't been in ER of late On xolair feels the spiriva may be really helping him still on prednisone--now down to 5mg  daily nebs four  times a day in general  has been more regluar with xanax helps his nerves did get sleepy one day when he may have overdone it  Wonders about ongoing omeprazole discussed need for continuing till asthma has calmed down  On allegra still from rash years ago That is gone now discussed using zyrtec and loratadine instead  Ongoing hand pain and joint swelling--esp in cold weather will continue plaquenil  Allergies: 1)  ! Cipro 2)  ! Altace 3)  ! Zocor 4)  ! Celexa 5)  ! Ativan 6)  ! Paxil  Past History:  Past medical, surgical, family and social histories (including risk factors) reviewed for relevance to current acute and chronic problems.  Past Medical History: Reviewed history from 12/07/2009 and no changes required. Asthma      - RAST and IgE check on 07/23/09.  His IgE was 527.7. ? CAD, no prior cath but abnormal stress test in past Chronic sinusitis Diverticulosis GERD Rheumatoid arthritis Prostate Cancer  Irritable bowel syndrome Anxiety Neurodermatitis Erectile dysfunction Sleep apnea      - BPAP 14/10 (12/09/09) Allergic rhinitis Hypertension  Past Surgical History: Reviewed history from 05/04/2009 and no changes  required. Radical prostatectomy Left knee arthroscopy Sinus surgery 2009 (deviated septum/polyps)  Family History: Reviewed history from 05/04/2009 and no changes required. Son - asthma, allergies Mother-deceased at age 54, heart failure Father-deceased at age 69 pneumonia 4 sisters healthy  Social History: Reviewed history from 05/12/2008 and no changes required. 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 Never Smoked Alcohol use-occasional Regular exercise-no  Review of Systems       weight down 2# Sleeps with CPAP Pain in legs and arms--wonders about med related myalgia. Ibuprofen 800mg  helps  Physical Exam  General:  alert.  NAD Neck:  supple, no masses, no thyromegaly, no carotid bruits, and no cervical lymphadenopathy.   Lungs:  normal respiratory effort, no intercostal retractions, no accessory muscle use, normal breath sounds, and no wheezes.   Heart:  normal rate, regular rhythm, no murmur, and no gallop.   Extremities:  no edema Psych:  normally interactive, good eye contact, not depressed appearing, and slightly anxious.     Impression & Recommendations:  Problem # 1:  EXTRINSIC ASTHMA, UNSPECIFIED (ICD-493.00) Assessment Improved seems to be better hopefully can continue to wean prednisone continue antihistamines  The following medications were removed from the medication list:    Prednisone 20 Mg Tabs (Prednisone) .Marland Kitchen... Take 1 by mouth two times a day His updated medication list for this problem includes:    Symbicort 160-4.5 Mcg/act Aero (Budesonide-formoterol fumarate) ..... Use 2 puffs two times a  day as needed    Spiriva Handihaler 18 Mcg Caps (Tiotropium bromide monohydrate) .Marland Kitchen... 1 capsule in inhaler daily    Singulair 10 Mg Tabs (Montelukast sodium) .Marland Kitchen... 1 at bedtime    Albuterol Sulfate (2.5 Mg/26ml) 0.083% Nebu (Albuterol sulfate) ..... One vial nebulized up to four times per day as needed    Proair Hfa 108 (90 Base) Mcg/act Aers  (Albuterol sulfate) .Marland Kitchen..Marland Kitchen Two puffs up to four times per day as needed    Prednisone 5 Mg Tabs (Prednisone) ..... Use as directed  Problem # 2:  HYPERTENSION (ICD-401.9) Assessment: Unchanged  good control no changes needed  His updated medication list for this problem includes:    Toprol Xl 100 Mg Xr24h-tab (Metoprolol succinate) .Marland Kitchen... Take 1 tablet by mouth once a day    Cozaar 50 Mg Tabs (Losartan potassium) .Marland Kitchen... Take 1 tablet by mouth once a day    Furosemide 40 Mg Tabs (Furosemide) .Marland Kitchen... 1 daily as needed for swelling in feet and legs  BP today: 128/80 Prior BP: 142/84 (12/07/2009)  Labs Reviewed: K+: 4.1 (11/11/2009) Creat: : 0.6 (11/11/2009)   Chol: 174 (08/12/2009)   HDL: 63.50 (08/12/2009)   LDL: 99 (08/12/2009)   TG: 60.0 (08/12/2009)  Orders: TLB-Renal Function Panel (80069-RENAL) TLB-CBC Platelet - w/Differential (85025-CBCD) TLB-TSH (Thyroid Stimulating Hormone) (84443-TSH) Venipuncture (04540)  Problem # 3:  RHEUMATOID ARTHRITIS (ICD-714.0) Assessment: Unchanged doesn't seem active will try off the plaquenil  The following medications were removed from the medication list:    Prednisone 20 Mg Tabs (Prednisone) .Marland Kitchen... Take 1 by mouth two times a day His updated medication list for this problem includes:    Prednisone 5 Mg Tabs (Prednisone) ..... Use as directed    Adult Aspirin Ec Low Strength 81 Mg Tbec (Aspirin) .Marland Kitchen... Take 1 tablet by mouth once a day  Problem # 4:  ANXIETY (ICD-300.00) Assessment: Improved doing slightly better lately  His updated medication list for this problem includes:    Alprazolam 0.5 Mg Tabs (Alprazolam) .Marland Kitchen... 1-2 tabs by mouth three times a day as needed for nerves  Problem # 5:  CORONARY ARTERY DISEASE (ICD-414.00) Assessment: Unchanged  no symptoms may need to try off the statin if weakness isn't better  His updated medication list for this problem includes:    Toprol Xl 100 Mg Xr24h-tab (Metoprolol succinate) .Marland Kitchen... Take 1  tablet by mouth once a day    Adult Aspirin Ec Low Strength 81 Mg Tbec (Aspirin) .Marland Kitchen... Take 1 tablet by mouth once a day    Cozaar 50 Mg Tabs (Losartan potassium) .Marland Kitchen... Take 1 tablet by mouth once a day    Furosemide 40 Mg Tabs (Furosemide) .Marland Kitchen... 1 daily as needed for swelling in feet and legs  Orders: TLB-Lipid Panel (80061-LIPID) TLB-Hepatic/Liver Function Pnl (80076-HEPATIC)  Problem # 6:  LOC OSTEOARTHROS NOT SPEC PRIM/SEC LOWER LEG (ICD-715.36) Assessment: Comment Only needs to work on quad strengthening even before TKR considered  His updated medication list for this problem includes:    Adult Aspirin Ec Low Strength 81 Mg Tbec (Aspirin) .Marland Kitchen... Take 1 tablet by mouth once a day  Complete Medication List: 1)  Symbicort 160-4.5 Mcg/act Aero (Budesonide-formoterol fumarate) .... Use 2 puffs two times a day as needed 2)  Spiriva Handihaler 18 Mcg Caps (Tiotropium bromide monohydrate) .Marland Kitchen.. 1 capsule in inhaler daily 3)  Singulair 10 Mg Tabs (Montelukast sodium) .Marland Kitchen.. 1 at bedtime 4)  Albuterol Sulfate (2.5 Mg/62ml) 0.083% Nebu (Albuterol sulfate) .... One vial nebulized up to  four times per day as needed 5)  Proair Hfa 108 (90 Base) Mcg/act Aers (Albuterol sulfate) .... Two puffs up to four times per day as needed 6)  Prednisone 5 Mg Tabs (Prednisone) .... Use as directed 7)  Toprol Xl 100 Mg Xr24h-tab (Metoprolol succinate) .... Take 1 tablet by mouth once a day 8)  Adult Aspirin Ec Low Strength 81 Mg Tbec (Aspirin) .... Take 1 tablet by mouth once a day 9)  Pravachol 20 Mg Tabs (Pravastatin sodium) .... Take 1 tablet by mouth once a day 10)  Temazepam 15 Mg Caps (Temazepam) .... Take 1or 2 at bedtime as needed to help sleep 11)  Multivitamins Tabs (Multiple vitamin) .... Take 1 tablet by mouth once a day 12)  Cozaar 50 Mg Tabs (Losartan potassium) .... Take 1 tablet by mouth once a day 13)  Furosemide 40 Mg Tabs (Furosemide) .Marland Kitchen.. 1 daily as needed for swelling in feet and legs 14)   Epipen 2-pak 0.3 Mg/0.59ml Devi (Epinephrine) .... Use as directed as needed for severe allergic reaction 15)  Alprazolam 0.5 Mg Tabs (Alprazolam) .Marland Kitchen.. 1-2 tabs by mouth three times a day as needed for nerves  Other Orders: TLB-PSA (Prostate Specific Antigen) (84153-PSA)  Patient Instructions: 1)  Try stopping plaquenil 2)  Please schedule a follow-up appointment in 2 months.  Prescriptions: ALBUTEROL SULFATE (2.5 MG/3ML) 0.083% NEBU (ALBUTEROL SULFATE) one vial nebulized up to four times per day as needed  #120 x 5   Entered and Authorized by:   Cindee Salt MD   Signed by:   Cindee Salt MD on 12/23/2009   Method used:   Electronically to        [facility] (retail)       [address], [address]       [place], [place]  [address]       Ph: [phone]       Fax: [phone]   RxID:   [id number]    Orders Added: 1)  Est. Patient Level IV [59563] 2)  TLB-Lipid Panel [80061-LIPID] 3)  TLB-Hepatic/Liver Function Pnl [80076-HEPATIC] 4)  TLB-Renal Function Panel [80069-RENAL] 5)  TLB-CBC Platelet - w/Differential [85025-CBCD] 6)  TLB-TSH (Thyroid Stimulating Hormone) [84443-TSH] 7)  Venipuncture [87564] 8)  TLB-PSA (Prostate Specific Antigen) [33295-JOA]   Current Allergies (reviewed today): ! CIPRO ! ALTACE ! ZOCOR ! CELEXA ! ATIVAN ! PAXIL

## 2010-12-28 NOTE — Letter (Signed)
Summary: [facility]  [facility]   Imported By: Lanelle Bal 05/21/2010 10:11:05  _____________________________________________________________________  External Attachment:    Type:   Image     Comment:   External Document  Appended Document: [facility] cysto benign adequate opening concerned for radiation nerve damage as cause of urinary retention and recommends neurology eval

## 2010-12-28 NOTE — Progress Notes (Signed)
Summary: having bowel problems  Phone Note Call from Patient Call back at Home Phone [phone] Call back at [phone]   Caller: Patient Call For: Cindee Salt MD Summary of Call: Pt states he is having bowel problems.  He says he has not had a good BM in several days.  He has been taking miralax and using colace.  He is now trying magnesium sulfate.  He is concerned because he is feeling a lot of pressure, drinking 8-10 ounces of water an hour.  What should he do?  And how should he take the magnesium?  He has taken 2/3'rds of the bottle in 2 seperate doses, but the instructions say to take one half of the bottle or a whole bottle with water. Initial call taken by: Lowella Petties CMA,  Apr 22, 2010 4:48 PM  Follow-up for Phone Call        taking the miralax 1-2 times daily catheter in and out several times--now out again just had bladder scan and is emptying  advised to start sennakot-S 2 two times a day  for routine IF Magnesium Sulfate is not effective, can clean out with miralax every 4 hours tomorrow AM Follow-up by: Cindee Salt MD,  Apr 22, 2010 5:39 PM

## 2010-12-28 NOTE — Progress Notes (Signed)
Summary: wants phone call   Phone Note Call from Patient Call back at cell- [phone]   Caller: Patient Call For: Cindee Salt MD Summary of Call: Patient is having same day surgery on the 19th. He says that he has some questions and concerns he would like to discuss with you. He says that it would mean alot to him if you would give him a call.  Initial call taken by: Melody Comas,  March 11, 2010 1:35 PM  Follow-up for Phone Call        will be having urethral stricture dilated Discussed precautions and meds (continuing aspirin, can take MTX after the surgery or the next day since Tuesday is his day) Follow-up by: Cindee Salt MD,  March 11, 2010 2:26 PM

## 2010-12-28 NOTE — Assessment & Plan Note (Signed)
Summary: 2:00 F/U ON SWELLING OF LEGS & ANKLES / LFW   Vital Signs:  Patient profile:   77 year old male Weight:      206 pounds Temp:     97.9 degrees F oral Pulse rate:   76 / minute Pulse rhythm:   regular BP sitting:   130 / 70  (left arm) Cuff size:   large  Vitals Entered By: Sydell Axon LPN (August 23, 2010 2:18 PM) CC: Follow-uup on swelling of legs and ankles   History of Present Illness: has been wearing compression socks Right leg is fine but the left foot stays swollen some Not nearly as bad as it had been in the past  Breathing is great No wheezing No SOB even in bed  Stools have been okay takes miralax and 2 dulcolax daily Occ skips the meds  Increases to 2 miralax a day if no stool the day before  having bladder spasms started vesicare via Dr Achilles Dunk It has seemed to help some Still with catheter ---has follow up soon MRI reportedly shows thickening or edema in bladder---likely radiation damage NCV consistent with peripheral neuropathy  Using the ibuprofen regularly prednisone is slowly being weaned  Allergies: 1)  ! Cipro 2)  ! Altace 3)  ! Zocor 4)  ! Celexa 5)  ! Ativan 6)  ! Paxil  Past History:  Past medical, surgical, family and social histories (including risk factors) reviewed for relevance to current acute and chronic problems.  Past Medical History: Reviewed history from 06/14/2010 and no changes required. CAD Asthma      - PFT 04/07/10 FEV1 2.61(86%), FVC 3.60(78%), FEV1% 73, TLC 7.26(105%), DLCO 112%, no BD      - RAST and IgE check on 07/23/09.  His IgE was 527.7. Chronic sinusitis Diverticulosis GERD Rheumatoid arthritis Prostate Cancer  Irritable bowel syndrome Anxiety Neurodermatitis Erectile dysfunction Obstructive sleep apnea      - BPAP 14/10 (12/09/09)>>stopped after weight loss Allergic rhinitis Hypertension Adenomatous Colon Polyps 1990 Depression  Past Surgical History: Reviewed history from 03/26/2010  and no changes required. Radical prostatectomy Left knee arthroscopy Sinus surgery 2009 (deviated septum/polyps) Urethral stricture repair  4/11  Dr Achilles Dunk  Family History: Reviewed history from 05/04/2009 and no changes required. Son - asthma, allergies Mother-deceased at age 44, heart failure Father-deceased at age 37 pneumonia 4 sisters healthy  Social History: Reviewed history from 05/12/2008 and no changes required. 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 Never Smoked Alcohol use-occasional Regular exercise-no  Review of Systems       eating okay weight back up again---he is satisfied with this  Physical Exam  General:  alert and normal appearance.   Neck:  supple, no masses, no thyromegaly, no carotid bruits, and no cervical lymphadenopathy.   Lungs:  normal respiratory effort, no intercostal retractions, no accessory muscle use, normal breath sounds, no crackles, and no wheezes.   Heart:  normal rate, regular rhythm, no murmur, and no gallop.   Abdomen:  soft and non-tender.   Extremities:  1+ edema in left ankle and foot but none in calf or on right side Psych:  normally interactive, good eye contact, not depressed appearing, and moderately anxious.     Impression & Recommendations:  Problem # 1:  EDEMA- LOCALIZED (ICD-782.3) Assessment Improved doing better with the support socks needs to continue with these---at least when he is going out for the day  Problem # 2:  EXTRINSIC ASTHMA, UNSPECIFIED (ICD-493.00) Assessment: Unchanged stable now on xolair  The following medications were removed from the medication list:    Prednisone 5 Mg Tabs (Prednisone) .Marland Kitchen... Take 1  by mouth once daily His updated medication list for this problem includes:    Symbicort 160-4.5 Mcg/act Aero (Budesonide-formoterol fumarate) .Marland Kitchen... 2 puffs two times a day    Xolair 150 Mg Solr (Omalizumab) .Marland KitchenMarland KitchenMarland KitchenMarland Kitchen 375 mg every two weeks subcutaneously    Albuterol Sulfate (2.5 Mg/35ml)  0.083% Nebu (Albuterol sulfate) ..... One vial nebulized up to four times per day as needed    Ventolin Hfa 108 (90 Base) Mcg/act Aers (Albuterol sulfate) .Marland Kitchen..Marland Kitchen Two puffs up to four times per day as needed    Prednisone 1 Mg Tabs (Prednisone) .Marland Kitchen... Take as directed  Problem # 3:  NEUROGENIC BLADDER (ICD-596.54) Assessment: Unchanged seems to be from damage from RT still with indwelling catheter he is interested in suprapubic catheter  Problem # 4:  CONSTIPATION (ICD-564.00) Assessment: Improved doing better with this  His updated medication list for this problem includes:    Miralax Powd (Polyethylene glycol 3350) .Marland KitchenMarland KitchenMarland KitchenMarland Kitchen 17 grams in 8 oz of water 1-2 x daily    Dulcolax Stool Softener 100 Mg Caps (Docusate sodium) .Marland Kitchen... 2 by mouth two times a day  Complete Medication List: 1)  Levsin/sl 0.125 Mg Subl (Hyoscyamine sulfate) .Marland Kitchen.. 1-2 tablets by mouth under tongue or by mouth four times a day as needed 2)  Symbicort 160-4.5 Mcg/act Aero (Budesonide-formoterol fumarate) .... 2 puffs two times a day 3)  Xolair 150 Mg Solr (Omalizumab) .... 375 mg every two weeks subcutaneously 4)  Albuterol Sulfate (2.5 Mg/62ml) 0.083% Nebu (Albuterol sulfate) .... One vial nebulized up to four times per day as needed 5)  Ventolin Hfa 108 (90 Base) Mcg/act Aers (Albuterol sulfate) .... Two puffs up to four times per day as needed 6)  Methotrexate 2.5 Mg Tabs (Methotrexate sodium) .... 9 tablets weekly for rheumatoid arthritis 7)  Metoprolol Succinate 50 Mg Xr24h-tab (Metoprolol succinate) .Marland Kitchen.. 1 tab daily for high blood pressure 8)  Temazepam 15 Mg Caps (Temazepam) .... Take 1or 2 at bedtime as needed to help sleep 9)  Alprazolam 0.5 Mg Tabs (Alprazolam) .Marland Kitchen.. 1-2 tabs by mouth three times a day as needed for nerves 10)  Omeprazole 20 Mg Tbec (Omeprazole) .... Take 1 by mouth once daily 11)  Mirtazapine 15 Mg Tabs (Mirtazapine) .Marland Kitchen.. 1 tab at bedtime for depression and poor eating 12)  Pravastatin Sodium 20 Mg  Tabs (Pravastatin sodium) .Marland Kitchen.. 1 tab daily for high cholesterol 13)  Hydroxychloroquine Sulfate 200 Mg Tabs (Hydroxychloroquine sulfate) .Marland Kitchen.. 1 tab by mouth two times a day for rheumatoid arthritis 14)  Gabapentin 300 Mg Caps (Gabapentin) .Marland Kitchen.. 1 tab by mouth three times a day for neuropathy 15)  Clindamycin Hcl 300 Mg Caps (Clindamycin hcl) .... Take one tablet by mouth three times a day 16)  Adult Aspirin Ec Low Strength 81 Mg Tbec (Aspirin) .... Take 1 tablet by mouth once a day 17)  Multivitamins Tabs (Multiple vitamin) .... Take 1 tablet by mouth once a day 18)  Miralax Powd (Polyethylene glycol 3350) .Marland KitchenMarland KitchenMarland Kitchen 17 grams in 8 oz of water 1-2 x daily 19)  Epipen 2-pak 0.3 Mg/0.65ml Devi (Epinephrine) .... Use as directed as needed for severe allergic reaction 20)  Dulcolax Stool Softener 100 Mg Caps (Docusate sodium) .... 2 by mouth two times a day 21)  Hydrocodone-acetaminophen 5-325 Mg Tabs (Hydrocodone-acetaminophen) .Marland Kitchen.. 1 up to four times daily for pain 22)  Vesicare 5 Mg Tabs (Solifenacin succinate) .Marland KitchenMarland KitchenMarland Kitchen  Take one by mouth daily 23)  Prednisone 1 Mg Tabs (Prednisone) .... Take as directed  Patient Instructions: 1)  Please keep appt November 30th  Current Allergies (reviewed today): ! CIPRO ! ALTACE ! ZOCOR ! CELEXA ! ATIVAN ! PAXIL   Appended Document: 2:00 F/U ON SWELLING OF LEGS & ANKLES / LFW   Influenza Vaccine    Vaccine Type: Fluvax MCR    Site: left deltoid    Mfr: GlaxoSmithKline    Dose: 0.5 ml    Route: IM    Given by: Sydell Axon LPN    Exp. Date: 05/28/2011    Lot #: [id number]    VIS given: 06/22/10 version given August 23, 2010.  Flu Vaccine Consent Questions    Do you have a history of severe allergic reactions to this vaccine? no    Any prior history of allergic reactions to egg and/or gelatin? no    Do you have a sensitivity to the preservative Thimersol? no    Do you have a past history of Guillan-Barre Syndrome? no    Do you currently have an acute  febrile illness? no    Have you ever had a severe reaction to latex? no    Vaccine information given and explained to patient? yes

## 2010-12-28 NOTE — Progress Notes (Signed)
Summary: speak ot nurse   Phone Note Call from Patient Call back at [phone]   Caller: Patient Call For: Russella Dar Reason for Call: Talk to Nurse Summary of Call: Patient wants to speak to nurse regarding prep instruction Initial call taken by: Tawni Levy,  May 13, 2010 1:50 PM  Follow-up for Phone Call        pt's questions were answered. Follow-up by: Ezra Sites RN,  May 13, 2010 2:07 PM

## 2010-12-28 NOTE — Assessment & Plan Note (Signed)
Summary: xolair/apc   Nurse Visit   Allergies: 1)  ! Cipro 2)  ! Altace 3)  ! Zocor 4)  ! Celexa 5)  ! Ativan 6)  ! Paxil  Medication Administration  Injection # 1:    Medication: Xolair (omalizumab) 150mg     Diagnosis: ASTHMA (ICD-493.90)    Route: SQ    Site: L deltoid    Exp Date: 03/28/2013    Lot #: [id number]    Mfr: GENENTECH    Comments: 1.0ML X 2 IN LEFT ARM AND 1.0ML IN RIGHT ARM PT WAITED 30 MINS    Patient tolerated injection without complications    Given by: SUSANNE FORD [facility]  Orders Added: 1)  Xolair (omalizumab) 150mg  [J2357] 2)  Administration xolair injection [[id number]]   Medication Administration  Injection # 1:    Medication: Xolair (omalizumab) 150mg     Diagnosis: ASTHMA (ICD-493.90)    Route: SQ    Site: L deltoid    Exp Date: 03/28/2013    Lot #: [id number]    Mfr: GENENTECH    Comments: 1.0ML X 2 IN LEFT ARM AND 1.0ML IN RIGHT ARM PT WAITED 30 MINS    Patient tolerated injection without complications    Given by: SUSANNE FORD IN [facility]  Orders Added: 1)  Xolair (omalizumab) 150mg  [J2357] 2)  Administration xolair injection [id number]  Appended Document: xolair/apc This patients diagnosis is 493.00 not 493.90/skb

## 2010-12-28 NOTE — Assessment & Plan Note (Signed)
Summary: 1 month return/mhh   Visit Type:  Follow-up Copy to:  Dr. Andee Poles, Dr. Saverio Danker, Dr. Assunta Gambles Primary Provider/Referring Provider:  Tillman Abide, MD  CC:  Asthma. The patient says there is no changes in his breathing. No cough..  History of Present Illness: 77 year old male with known history of  asthma, OSA, and rhinits.  His breathing has been doing okay.  He has not needed to use his nebulizer in almost one month.  He is not having cough, wheeze, or sputum.  He is down to 5 mg prednisone once daily.  He tried stopping his nasal steroid spray, but then his sinuses got worse.  He is being evaluated for neurogenic bladder.  He still has a catheter in.  As a result he has not felt like trying to use his BPAP.  He feels he is sleeping okay, and does not have as much breathing problems while asleep.  This has coincided with his weight loss.  He is seeing Evalina Field with behavioral health, and this has helped.  He takes a xanax when he gets anxious, and this seems to help avoiding an asthma attack.  Current Medications (verified): 1)  Levsin/sl 0.125 Mg Subl (Hyoscyamine Sulfate) .Marland Kitchen.. 1-2 Tablets By Mouth Under Tongue or By Mouth Four Times A Day As Needed 2)  Singulair 10 Mg Tabs (Montelukast Sodium) .Marland Kitchen.. 1 At Bedtime 3)  Albuterol Sulfate (2.5 Mg/37ml) 0.083% Nebu (Albuterol Sulfate) .... One Vial Nebulized Up To Four Times Per Day As Needed 4)  Ventolin Hfa 108 (90 Base) Mcg/act Aers (Albuterol Sulfate) .... Two Puffs Up To Four Times Per Day As Needed 5)  Methotrexate 2.5 Mg Tabs (Methotrexate Sodium) .... 9 Tablets Weekly For Rheumatoid Arthritis 6)  Prednisone 5 Mg Tabs (Prednisone) .... Take 1  By Mouth Once Daily 7)  Temazepam 15 Mg  Caps (Temazepam) .... Take 1or 2 At Bedtime As Needed To Help Sleep 8)  Epipen 2-Pak 0.3 Mg/0.3ml Devi (Epinephrine) .... Use As Directed As Needed For Severe Allergic Reaction 9)  Alprazolam 0.5 Mg Tabs (Alprazolam) .Marland Kitchen.. 1-2 Tabs By  Mouth Three Times A Day As Needed For Nerves 10)  Adult Aspirin Ec Low Strength 81 Mg  Tbec (Aspirin) .... Take 1 Tablet By Mouth Once A Day 11)  Multivitamins   Tabs (Multiple Vitamin) .... Take 1 Tablet By Mouth Once A Day 12)  Senokot S 8.6-50 Mg Tabs (Sennosides-Docusate Sodium) .... One Tablet By Mouth Two Times A Day 13)  Miralax  Powd (Polyethylene Glycol 3350) .Marland KitchenMarland KitchenMarland Kitchen 17 Grams in 8 Oz of Water 1-2 X Daily 14)  Metoprolol Succinate 50 Mg Xr24h-Tab (Metoprolol Succinate) .Marland Kitchen.. 1 Tab Daily For High Blood Pressure 15)  Symbicort 160-4.5 Mcg/act Aero (Budesonide-Formoterol Fumarate) .... 2 Puffs Two Times A Day  Allergies (verified): 1)  ! Cipro 2)  ! Altace 3)  ! Zocor 4)  ! Celexa 5)  ! Ativan 6)  ! Paxil  Past History:  Past Medical History: CAD Asthma      - PFT 04/07/10 FEV1 2.61(86%), FVC 3.60(78%), FEV1% 73, TLC 7.26(105%), DLCO 112%, no BD      - RAST and IgE check on 07/23/09.  His IgE was 527.7. Chronic sinusitis Diverticulosis GERD Rheumatoid arthritis Prostate Cancer  Irritable bowel syndrome Anxiety Neurodermatitis Erectile dysfunction Obstructive sleep apnea      - BPAP 14/10 (12/09/09)>>stopped after weight loss Allergic rhinitis Hypertension Adenomatous Colon Polyps 1990  Past Surgical History: Reviewed history from 03/26/2010 and no  changes required. Radical prostatectomy Left knee arthroscopy Sinus surgery 2009 (deviated septum/polyps) Urethral stricture repair  4/11  Dr Achilles Dunk  Vital Signs:  Patient profile:   77 year old male Height:      71 inches (180.34 cm) Weight:      190 pounds (86.36 kg) BMI:     26.60 O2 Sat:      99 % on Room air Temp:     97.7 degrees F (36.50 degrees C) oral Pulse rate:   85 / minute BP sitting:   118 / 70  (left arm) Cuff size:   regular  Vitals Entered By: Michel Bickers CMA (June 04, 2010 3:59 PM)  O2 Sat at Rest %:  99 O2 Flow:  Room air CC: Asthma. The patient says there is no changes in his breathing. No  cough. Comments Medications reviewed. Michel Bickers CMA  June 04, 2010 4:00 PM   Physical Exam  General:  thin.   Nose:  no nasal discharge, no sinus tenderness Mouth:  no oral lesion Neck:  no JVD.   Lungs:  no wheezing or rales Heart:  regular rhythm, normal rate, and no murmurs.   Extremities:  minimal joint swelling in his hands Cervical Nodes:  no significant adenopathy   Impression & Recommendations:  Problem # 1:  ASTHMA (ICD-493.90) He has done well with xolair.  He is to continue this and symbicort.  Will have him stop singulair, and see how he does.  Problem # 2:  OBSTRUCTIVE SLEEP APNEA (ICD-327.23) His sleep apnea has likely improved with his weight loss.  He can take a break from using BPAP, and will re-address if his sleep pattern gets worse.  Problem # 3:  ALLERGIC RHINITIS (ICD-477.9) He is to continue his sinus regimen.  Problem # 4:  RHEUMATOID ARTHRITIS (ICD-714.0) Will defer weaning his prednisone to rheumatology.  Medications Added to Medication List This Visit: 1)  Symbicort 160-4.5 Mcg/act Aero (Budesonide-formoterol fumarate) .... 2 puffs two times a day 2)  Methotrexate 2.5 Mg Tabs (Methotrexate sodium) .... 9 tablets weekly for rheumatoid arthritis 3)  Prednisone 5 Mg Tabs (Prednisone) .... Take 1  by mouth once daily  Complete Medication List: 1)  Levsin/sl 0.125 Mg Subl (Hyoscyamine sulfate) .Marland Kitchen.. 1-2 tablets by mouth under tongue or by mouth four times a day as needed 2)  Symbicort 160-4.5 Mcg/act Aero (Budesonide-formoterol fumarate) .... 2 puffs two times a day 3)  Albuterol Sulfate (2.5 Mg/54ml) 0.083% Nebu (Albuterol sulfate) .... One vial nebulized up to four times per day as needed 4)  Ventolin Hfa 108 (90 Base) Mcg/act Aers (Albuterol sulfate) .... Two puffs up to four times per day as needed 5)  Methotrexate 2.5 Mg Tabs (Methotrexate sodium) .... 9 tablets weekly for rheumatoid arthritis 6)  Prednisone 5 Mg Tabs (Prednisone) .... Take 1  by  mouth once daily 7)  Metoprolol Succinate 50 Mg Xr24h-tab (Metoprolol succinate) .Marland Kitchen.. 1 tab daily for high blood pressure 8)  Adult Aspirin Ec Low Strength 81 Mg Tbec (Aspirin) .... Take 1 tablet by mouth once a day 9)  Temazepam 15 Mg Caps (Temazepam) .... Take 1or 2 at bedtime as needed to help sleep 10)  Alprazolam 0.5 Mg Tabs (Alprazolam) .Marland Kitchen.. 1-2 tabs by mouth three times a day as needed for nerves 11)  Multivitamins Tabs (Multiple vitamin) .... Take 1 tablet by mouth once a day 12)  Senokot S 8.6-50 Mg Tabs (Sennosides-docusate sodium) .... One tablet by mouth two times a  day 13)  Miralax Powd (Polyethylene glycol 3350) .Marland KitchenMarland KitchenMarland Kitchen 17 grams in 8 oz of water 1-2 x daily 14)  Epipen 2-pak 0.3 Mg/0.77ml Devi (Epinephrine) .... Use as directed as needed for severe allergic reaction  Other Orders: Est. Patient Level III (11914)  Patient Instructions: 1)  Stop singulair 2)  Continue Symbicort two puffs two times a day 3)  Continue albuterol as needed  4)  Follow up in 4 to 6 weeks  Appended Document: Orders Update     Clinical Lists Changes        Medication Administration  Injection # 1:    Medication: Xolair (omalizumab) 150mg     Diagnosis: 493.00    Route: SQ    Site: L deltoid    Exp Date: 03/28/2013    Lot #: [id number]    Mfr: Salome Spotted    Comments: 1.0 ML X 2 IN LEFT ARM AND 1.0 ML IN RIGHT ARM CHARGED [id number]    Patient tolerated injection without complications    Given by: TAMMY SCOTT IN [facility]

## 2010-12-28 NOTE — Progress Notes (Signed)
  Phone Note Outgoing Call   Summary of Call: spoke to Dr Achilles Dunk he did not have reservations about him taking methotrexate---would have some concerns about immune suppression with the IV biologicals but not MTX  called patient and asked him to start the MTX today Initial call taken by: Cindee Salt MD,  March 02, 2010 10:52 AM    New/Updated Medications: METHOTREXATE 2.5 MG TABS (METHOTREXATE SODIUM) 6 tablets weekly for rheumatoid arthritis

## 2010-12-28 NOTE — Progress Notes (Signed)
Summary: OMEPRAZOLE  Phone Note Refill Request Message from:  [id number] on June 08, 2010 8:21 AM  E-Scribe Request for Omeprazole 20mg , this was removed in January pt stated he didn't take it anymore, ok to fill? not on current med list.   Method Requested: Electronic Initial call taken by: Mervin Hack CMA [name]),  June 08, 2010 8:22 AM  Follow-up for Phone Call        Please check with him Okay to refill if still taking Follow-up by: Cindee Salt MD,  June 08, 2010 9:13 AM  Additional Follow-up for Phone Call Additional follow up Details #1::        spoke with patient and he states he's been taking Omeprazole on and off, I will send in rx for this. I did advise pt that his insurance may or may not cover. Pt states he understands. Additional Follow-up by: Mervin Hack CMA [name]),  June 09, 2010 9:46 AM    New/Updated Medications: OMEPRAZOLE 20 MG TBEC (OMEPRAZOLE) take 1 by mouth once daily Prescriptions: OMEPRAZOLE 20 MG TBEC (OMEPRAZOLE) take 1 by mouth once daily  #30 x 12   Entered by:   Mervin Hack CMA (AAMA)   Authorized by:   Cindee Salt MD   Signed by:   Mervin Hack CMA (AAMA) on 06/09/2010   Method used:   Electronically to        [facility] [address] (retail)       [address], [address]       [place], [place]  [address]       Ph: [phone]       Fax: [phone]   RxID:   [id number]

## 2010-12-28 NOTE — Letter (Signed)
Summary: [facility]   Imported By: Lanelle Bal 04/19/2010 08:32:16  _____________________________________________________________________  External Attachment:    Type:   Image     Comment:   External Document

## 2010-12-28 NOTE — Letter (Signed)
Summary: New Patient letter  [facility]  [address] [place], [place] [address]   Phone: [phone]  Fax: [phone]       04/23/2010 MRN: [id number]  [facility] [address] [place], [place]  [address]  Dear Mr. BRAMER,  Welcome to the Gastroenterology Division at [facility].    You are scheduled to see Dr.  Russella Dar on 06-09-10 at 11:00a.m. on the 3rd floor at [facility], [address]  We ask that you try to arrive at our office 15 minutes prior to your appointment time to allow for check-in.  We would like you to complete the enclosed self-administered evaluation form prior to your visit and bring it with you on the day of your appointment.  We will review it with you.  Also, please bring a complete list of all your medications or, if you prefer, bring the medication bottles and we will list them.  Please bring your insurance card so that we may make a copy of it.  If your insurance requires a referral to see a specialist, please bring your referral form from your primary care physician.  Co-payments are due at the time of your visit and may be paid by cash, check or credit card.     Your office visit will consist of a consult with your physician (includes a physical exam), any laboratory testing he/she may order, scheduling of any necessary diagnostic testing (e.g. x-ray, ultrasound, CT-scan), and scheduling of a procedure (e.g. Endoscopy, Colonoscopy) if required.  Please allow enough time on your schedule to allow for any/all of these possibilities.    If you cannot keep your appointment, please call [phone] to cancel or reschedule prior to your appointment date.  This allows [place] the opportunity to schedule an appointment for another patient in need of care.  If you do not cancel or reschedule by 5 p.m. the business day prior to your appointment date, you will be charged a $50.00 late cancellation/no-show fee.    Thank you for choosing [facility] for your medical needs.  We appreciate the opportunity to care for you.  Please visit [place] at our website  to learn more about our practice.                     Sincerely,                                                             The Gastroenterology Division

## 2010-12-28 NOTE — Assessment & Plan Note (Signed)
Summary: ER F/U DLO   Vital Signs:  Patient profile:   77 year old male Weight:      198 pounds Temp:     97.9 degrees F oral Pulse rate:   78 / minute Pulse rhythm:   regular BP sitting:   128 / 80  (left arm) Cuff size:   large  Vitals Entered By: Mervin Hack CMA [name]) (October 01, 2010 12:14 PM) CC: follow-up visit   History of Present Illness: Went back to [facility] ER Took out foley and tried intermittent catheterization Was actually able to void spontaneously--was going 10 times per day and still doing the caths 6-7 times per day Then was able to void indendently started on doxy by urologist  Then vomited on Saturday night--felt queasy pain with catheterization Went to ER next day 10/31  Had foley inserted in ER Sodium was 118--felt to be causing the nausea, etc No UTI found Had abd CT also---everything looked okay Has tried some fluid restriction  still with some nausea   Allergies: 1)  ! Cipro 2)  ! Altace 3)  ! Zocor 4)  ! Celexa 5)  ! Ativan 6)  ! Paxil  Past History:  Past medical, surgical, family and social histories (including risk factors) reviewed for relevance to current acute and chronic problems.  Past Medical History: Reviewed history from 06/14/2010 and no changes required. CAD Asthma      - PFT 04/07/10 FEV1 2.61(86%), FVC 3.60(78%), FEV1% 73, TLC 7.26(105%), DLCO 112%, no BD      - RAST and IgE check on 07/23/09.  His IgE was 527.7. Chronic sinusitis Diverticulosis GERD Rheumatoid arthritis Prostate Cancer  Irritable bowel syndrome Anxiety Neurodermatitis Erectile dysfunction Obstructive sleep apnea      - BPAP 14/10 (12/09/09)>>stopped after weight loss Allergic rhinitis Hypertension Adenomatous Colon Polyps 1990 Depression  Past Surgical History: Reviewed history from 03/26/2010 and no changes required. Radical prostatectomy Left knee arthroscopy Sinus surgery 2009 (deviated septum/polyps) Urethral stricture repair   4/11  Dr Achilles Dunk  Family History: Reviewed history from 05/04/2009 and no changes required. Son - asthma, allergies Mother-deceased at age 94, heart failure Father-deceased at age 56 pneumonia 4 sisters healthy  Social History: Reviewed history from 05/12/2008 and no changes required. 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 Never Smoked Alcohol use-occasional Regular exercise-no  Review of Systems       weight down 8# bowels are loose  Physical Exam  General:  alert and normal appearance.   Neck:  supple, no masses, and no thyromegaly.   Lungs:  normal respiratory effort, no intercostal retractions, no accessory muscle use, and normal breath sounds.   Heart:  normal rate, regular rhythm, no murmur, and no gallop.   Abdomen:  soft and non-tender.     Impression & Recommendations:  Problem # 1:  HYPONATREMIA (ICD-276.1) Assessment Comment Only  likely from psychogenic polydipsia given his anxiety and compulsive nature on fluid restriction now will recheck  Orders: TLB-Renal Function Panel (80069-RENAL) Venipuncture (17616)  Problem # 2:  NEUROGENIC BLADDER (WVP-710.62) Assessment: Comment Only poor effort with intermittent caths did far too many!!!  I will instruct him before the next trial  Complete Medication List: 1)  Hydrocodone-acetaminophen 5-325 Mg Tabs (Hydrocodone-acetaminophen) .Marland Kitchen.. 1 up to four times daily for pain 2)  Symbicort 160-4.5 Mcg/act Aero (Budesonide-formoterol fumarate) .... 2 puffs two times a day 3)  Xolair 150 Mg Solr (Omalizumab) .... 375 mg every two weeks subcutaneously 4)  Albuterol Sulfate (2.5 Mg/51ml) 0.083%  Nebu (Albuterol sulfate) .... One vial nebulized up to four times per day as needed 5)  Ventolin Hfa 108 (90 Base) Mcg/act Aers (Albuterol sulfate) .... Two puffs up to four times per day as needed 6)  Methotrexate 2.5 Mg Tabs (Methotrexate sodium) .... 9 tablets weekly for rheumatoid arthritis 7)  Metoprolol Succinate  50 Mg Xr24h-tab (Metoprolol succinate) .Marland Kitchen.. 1 tab daily for high blood pressure 8)  Temazepam 15 Mg Caps (Temazepam) .... Take 1or 2 at bedtime as needed to help sleep 9)  Alprazolam 0.5 Mg Tabs (Alprazolam) .Marland Kitchen.. 1-2 tabs by mouth three times a day as needed for nerves 10)  Omeprazole 20 Mg Tbec (Omeprazole) .... Take 1 by mouth once daily 11)  Mirtazapine 15 Mg Tabs (Mirtazapine) .Marland Kitchen.. 1 tab at bedtime for depression and poor eating 12)  Pravastatin Sodium 20 Mg Tabs (Pravastatin sodium) .Marland Kitchen.. 1 tab daily for high cholesterol 13)  Hydroxychloroquine Sulfate 200 Mg Tabs (Hydroxychloroquine sulfate) .Marland Kitchen.. 1 tab by mouth two times a day for rheumatoid arthritis 14)  Gabapentin 300 Mg Caps (Gabapentin) .Marland Kitchen.. 1 tab by mouth three times a day for neuropathy 15)  Adult Aspirin Ec Low Strength 81 Mg Tbec (Aspirin) .... Take 1 tablet by mouth once a day 16)  Multivitamins Tabs (Multiple vitamin) .... Take 1 tablet by mouth once a day 17)  Miralax Powd (Polyethylene glycol 3350) .Marland KitchenMarland KitchenMarland Kitchen 17 grams in 8 oz of water 1-2 x daily 18)  Dulcolax Stool Softener 100 Mg Caps (Docusate sodium) .... 2 by mouth two times a day 19)  Vesicare 5 Mg Tabs (Solifenacin succinate) .... Take one by mouth daily 20)  Prednisone 1 Mg Tabs (Prednisone) .... Take as directed  Patient Instructions: 1)  Keep the appt on November 30th 2)  It is okay to try meclizine 25 mg two times a day for the nausea for now   Orders Added: 1)  Est. Patient Level III [30160] 2)  TLB-Renal Function Panel [80069-RENAL] 3)  Venipuncture [10932]    Current Allergies (reviewed today): ! CIPRO ! ALTACE ! ZOCOR ! CELEXA ! ATIVAN ! PAXIL

## 2010-12-28 NOTE — Assessment & Plan Note (Signed)
Summary: xolair/apc   Nurse Visit   Allergies: 1)  ! Cipro 2)  ! Altace 3)  ! Zocor 4)  ! Celexa 5)  ! Ativan 6)  ! Paxil  Medication Administration  Injection # 1:    Medication: Xolair (omalizumab) 150mg     Diagnosis: 493.00    Route: SQ    Site: R deltoid    Exp Date: 03/28/2013    Lot #: [id number]    Mfr: genentech    Comments: 1.0 ml x 2 charged [phone]    Patient tolerated injection without complications    Given by: tammy scott in allergy lab   Medication Administration  Injection # 1:    Medication: Xolair (omalizumab) 150mg     Diagnosis: 493.00    Route: SQ    Site: R deltoid    Exp Date: 03/28/2013    Lot #: [id number]    Mfr: genentech    Comments: 1.0 ml x 2 charged 96401    Patient tolerated injection without complications    Given by: tammy scott in allergy lab

## 2010-12-28 NOTE — Assessment & Plan Note (Signed)
Summary: xolair/mhh   Nurse Visit   Allergies: 1)  ! Cipro 2)  ! Altace 3)  ! Zocor 4)  ! Celexa 5)  ! Ativan 6)  ! Paxil  Medication Administration  Injection # 1:    Medication: Xolair (omalizumab) 150mg     Diagnosis: EXTRINSIC ASTHMA, UNSPECIFIED (ICD-493.00)    Route: SQ    Site: L deltoid    Exp Date: 08/2013    Lot #: [id number]    Mfr: Salome Spotted    Comments: 1.0 ML X 2 IN LEFT ARM AND 1.0ML X 1 RIGHT ARM  PT WAITED 15 MINS 375 MG CHARGED J2357 AND 04540    Given by: Paula Compton IN ALLERGY LAB  Orders Added: 1)  Xolair (omalizumab) 150mg  [J2357] 2)  Administration xolair injection [98119]   Medication Administration  Injection # 1:    Medication: Xolair (omalizumab) 150mg     Diagnosis: EXTRINSIC ASTHMA, UNSPECIFIED (ICD-493.00)    Route: SQ    Site: L deltoid    Exp Date: 08/2013    Lot #: [id number]    Mfr: Salome Spotted    Comments: 1.0 ML X 2 IN LEFT ARM AND 1.0ML X 1 RIGHT ARM  PT WAITED 15 MINS 375 MG CHARGED J2357 AND 56213    Given by: Paula Compton IN ALLERGY LAB  Orders Added: 1)  Xolair (omalizumab) 150mg  [J2357] 2)  Administration xolair injection [08657]

## 2010-12-28 NOTE — Miscellaneous (Signed)
Summary: Order for [facility]   Order for [employer] [facility]   Imported By: Lanelle Bal 02/02/2010 10:01:26  _____________________________________________________________________  External Attachment:    Type:   Image     Comment:   External Document

## 2010-12-28 NOTE — Progress Notes (Signed)
Summary: wants referral to rheumatologist  Phone Note Call from Patient Call back at [phone]   Caller: Patient Call For: Willie Salt MD Summary of Call: Pt has been having a bad time with his rheumatoid arthritis and is asking to be referred to a specialist in [place].  Please advise. Initial call taken by: Lowella Petties CMA,  January 18, 2010 2:46 PM  Follow-up for Phone Call        okay to refer to Dr Lavenia Atlas Follow-up by: Willie Salt MD,  January 18, 2010 3:21 PM  Additional Follow-up for Phone Call Additional follow up Details #1::        Appt made with Dr Lavenia Atlas on 02/05/2010 at 9:00am.  Additional Follow-up by: Carlton Adam,  January 19, 2010 12:38 PM

## 2010-12-28 NOTE — Letter (Signed)
Summary: [facility] Instructions  [facility]  [address] [place], [place] [address]   Phone: [phone]  Fax: [phone]       [name]    01/18/74/1935    MRN: [id number]        Procedure Day /Date: Tuesday June 21st, 2011     Arrival Time: 12:30pm     Procedure Time: 1:30pm     Location of Procedure:                    _ x_  [facility] (4th Floor)                        PREPARATION FOR COLONOSCOPY WITH MOVIPREP   Starting 5 days prior to your procedure 05/14/10 do not eat nuts, seeds, popcorn, corn, beans, peas,  salads, or any raw vegetables.  Do not take any fiber supplements (e.g. Metamucil, Citrucel, and Benefiber).  THE DAY BEFORE YOUR PROCEDURE         DATE: 05/17/10  DAY: Monday  1.  Drink clear liquids the entire day-NO SOLID FOOD  2.  Do not drink anything colored red or purple.  Avoid juices with pulp.  No orange juice.  3.  Drink at least 64 oz. (8 glasses) of fluid/clear liquids during the day to prevent dehydration and help the prep work efficiently.  CLEAR LIQUIDS INCLUDE: Water Jello Ice Popsicles Tea (sugar ok, no milk/cream) Powdered fruit flavored drinks Coffee (sugar ok, no milk/cream) Gatorade Juice: apple, white grape, white cranberry  Lemonade Clear bullion, consomm, broth Carbonated beverages (any kind) Strained chicken noodle soup Hard Candy                             4.  In the morning, mix first dose of MoviPrep solution:    Empty 1 Pouch A and 1 Pouch B into the disposable container    Add lukewarm drinking water to the top line of the container. Mix to dissolve    Refrigerate (mixed solution should be used within 24 hrs)  5.  Begin drinking the prep at 5:00 p.m. The MoviPrep container is divided by 4 marks.   Every 15 minutes drink the solution down to the next mark (approximately 8 oz) until the full liter is complete.   6.  Follow completed prep with 16 oz of clear liquid of your choice  (Nothing red or purple).  Continue to drink clear liquids until bedtime.  7.  Before going to bed, mix second dose of MoviPrep solution:    Empty 1 Pouch A and 1 Pouch B into the disposable container    Add lukewarm drinking water to the top line of the container. Mix to dissolve    Refrigerate  THE DAY OF YOUR PROCEDURE      DATE: 05/18/10 DAY: Tuesday  Beginning at 8:30 a.m. (5 hours before procedure):         1. Every 15 minutes, drink the solution down to the next mark (approx 8 oz) until the full liter is complete.  2. Follow completed prep with 16 oz. of clear liquid of your choice.    3. You may drink clear liquids until 11:30am (2 HOURS BEFORE PROCEDURE).   MEDICATION INSTRUCTIONS  Unless otherwise instructed, you should take regular prescription medications with a small sip of water   as early as possible the morning of your  procedure.         OTHER INSTRUCTIONS  You will need a responsible adult at least 75 years of age to accompany you and drive you home.   This person must remain in the waiting room during your procedure.  Wear loose fitting clothing that is easily removed.  Leave jewelry and other valuables at home.  However, you may wish to bring a book to read or  an iPod/MP3 player to listen to music as you wait for your procedure to start.  Remove all body piercing jewelry and leave at home.  Total time from sign-in until discharge is approximately 2-3 hours.  You should go home directly after your procedure and rest.  You can resume normal activities the  day after your procedure.  The day of your procedure you should not:   Drive   Make legal decisions   Operate machinery   Drink alcohol   Return to work  You will receive specific instructions about eating, activities and medications before you leave.    The above instructions have been reviewed and explained to me by   _______________________    I fully understand and can verbalize  these instructions _____________________________ Date _________

## 2010-12-28 NOTE — Miscellaneous (Signed)
Summary: Pulmonary function test   Pulmonary Function Test Date: 04/07/2010 Height (in.): 72 Gender: Male  Pre-Spirometry FVC    Value: 3.49 L/min   Pred: 4.59 L/min     % Pred: 76 % FEV1    Value: 2.47 L     Pred: 3.03 L     % Pred: 82 % FEV1/FVC  Value: 71 %     Pred: 67 %     % Pred: . % FEF 25-75  Value: 1.68 L/min   Pred: 2.61 L/min     % Pred: 64 %  Post-Spirometry FVC    Value: 3.60 L/min   Pred: 4.59 L/min     % Pred: 78 % FEV1    Value: 2.61 L     Pred: 3.03 L     % Pred: 86 % FEV1/FVC  Value: 73 %     Pred: 67 %     % Pred: . % FEF 25-75  Value: 1.95 L/min   Pred: 2.61 L/min     % Pred: 75 %  Lung Volumes TLC    Value: 7.26 L   % Pred: 105 % RV    Value: 3.56 L   % Pred: 130 % DLCO    Value: 25.4 %   % Pred: 112 % DLCO/VA  Value: 4.15 %   % Pred: 121 %  Comments: Small airways obstruction.  Normal lung volumes.  Normal diffusion capacity.  No bronchodilator response.  Clinical Lists Changes  Observations: Added new observation of PFT COMMENTS: Small airways obstruction.  Normal lung volumes.  Normal diffusion capacity.  No bronchodilator response. (04/07/2010 16:09) Added new observation of DLCO/VA%EXP: 121 % (04/07/2010 16:09) Added new observation of DLCO/VA: 4.15 % (04/07/2010 16:09) Added new observation of DLCO % EXPEC: 112 % (04/07/2010 16:09) Added new observation of DLCO: 25.4 % (04/07/2010 16:09) Added new observation of RV % EXPECT: 130 % (04/07/2010 16:09) Added new observation of RV: 3.56 L (04/07/2010 16:09) Added new observation of TLC % EXPECT: 105 % (04/07/2010 16:09) Added new observation of TLC: 7.26 L (04/07/2010 16:09) Added new observation of FEF2575%EXPS: 75 % (04/07/2010 16:09) Added new observation of PSTFEF25/75P: 2.61  (04/07/2010 16:09) Added new observation of PSTFEF25/75%: 1.95 L/min (04/07/2010 16:09) Added new observation of PSTFEV1/FCV%: . % (04/07/2010 16:09) Added new observation of FEV1FVCPRDPS: 67 % (04/07/2010 16:09) Added  new observation of PSTFEV1/FVC: 73 % (04/07/2010 16:09) Added new observation of POSTFEV1%PRD: 86 % (04/07/2010 16:09) Added new observation of FEV1PRDPST: 3.03 L (04/07/2010 16:09) Added new observation of POST FEV1: 2.61 L/min (04/07/2010 16:09) Added new observation of POST FVC%EXP: 78 % (04/07/2010 16:09) Added new observation of FVCPRDPST: 4.59 L/min (04/07/2010 16:09) Added new observation of POST FVC: 3.60 L (04/07/2010 16:09) Added new observation of FEF % EXPEC: 64 % (04/07/2010 16:09) Added new observation of FEF25-75%PRE: 2.61 L/min (04/07/2010 16:09) Added new observation of FEF 25-75%: 1.68 L/min (04/07/2010 16:09) Added new observation of FEV1/FVC%EXP: . % (04/07/2010 16:09) Added new observation of FEV1/FVC PRE: 67 % (04/07/2010 16:09) Added new observation of FEV1/FVC: 71 % (04/07/2010 16:09) Added new observation of FEV1 % EXP: 82 % (04/07/2010 16:09) Added new observation of FEV1 PREDICT: 3.03 L (04/07/2010 16:09) Added new observation of FEV1: 2.47 L (04/07/2010 16:09) Added new observation of FVC % EXPECT: 76 % (04/07/2010 16:09) Added new observation of FVC PREDICT: 4.59 L (04/07/2010 16:09) Added new observation of FVC: 3.49 L (04/07/2010 16:09) Added new observation of PFT HEIGHT: 72  (04/07/2010 16:09) Added  new observation of PFT DATE: 04/07/2010  (04/07/2010 16:09)

## 2010-12-28 NOTE — Consult Note (Signed)
Summary: [facility]   Imported By: Lanelle Bal 07/15/2010 13:32:19  _____________________________________________________________________  External Attachment:    Type:   Image     Comment:   External Document  Appended Document: [facility] still awaiting neurology eval

## 2010-12-28 NOTE — Letter (Signed)
Summary: [facility]  [facility]   Imported By: Lennie Odor 02/02/2010 14:45:29  _____________________________________________________________________  External Attachment:    Type:   Image     Comment:   External Document

## 2010-12-28 NOTE — Progress Notes (Signed)
Summary: TEMAZEPAM  Phone Note Refill Request Message from:  [facility] [phone] on August 30, 2010 3:23 PM  Refills Requested: Medication #1:  TEMAZEPAM 15 MG  CAPS Take 1or 2 at bedtime as needed to help sleep   Last Refilled: 08/06/2010 E-Scribe Request    Method Requested: Telephone to Pharmacy Initial call taken by: Mervin Hack CMA [name]),  August 30, 2010 3:24 PM  Follow-up for Phone Call        okay #60 x 5 Follow-up by: Cindee Salt MD,  August 30, 2010 5:44 PM  Additional Follow-up for Phone Call Additional follow up Details #1::        Rx called to pharmacy Additional Follow-up by: DeShannon Katrinka Blazing CMA [name]),  August 31, 2010 8:01 AM    Prescriptions: TEMAZEPAM 15 MG  CAPS (TEMAZEPAM) Take 1or 2 at bedtime as needed to help sleep  #60 x 5   Entered by:   Mervin Hack CMA (AAMA)   Authorized by:   Cindee Salt MD   Signed by:   Mervin Hack CMA (AAMA) on 08/31/2010   Method used:   Telephoned to ...       [facility] [address] (retail)       [address]       [place], [place]  [address]       Ph: [phone]       Fax: [phone]   RxID:   [id number]

## 2010-12-28 NOTE — Assessment & Plan Note (Signed)
Summary: 1 m f/u dlo   Vital Signs:  Patient profile:   77 year old male Weight:      203 pounds O2 Sat:      96 % on Room air Temp:     99.1 degrees F oral Pulse rate:   96 / minute Pulse rhythm:   regular BP sitting:   118 / 70  (left arm) Cuff size:   large  Vitals Entered By: Mervin Hack CMA [name]) (Apr 05, 2010 2:57 PM)  O2 Flow:  Room air CC: 1 month follow-up   History of Present Illness: Has been eating better Back to normal diet plus ensure supplements weight up 7#  Reviewed reassuring BW with Dr Gavin Potters Increasing dose to 20mg  weekly  prednisone has been cut to 7.5 mg daily Arthritis is definitely better  Bowels are better Most normal BM in a long time last night went again twice this AM Using miralax 1-2 per day  Mood is better since eating better not overly stressed or depressed  Allergies: 1)  ! Cipro 2)  ! Altace 3)  ! Zocor 4)  ! Celexa 5)  ! Ativan 6)  ! Paxil  Past History:  Past medical, surgical, family and social histories (including risk factors) reviewed for relevance to current acute and chronic problems.  Past Medical History: Reviewed history from 02/15/2010 and no changes required. CAD Asthma      - PFT 06/05/08 FEV1 2.84(93%), FVC 3.99(87%), FEV1% 71, TLC 6.78(97%), DLCO 105%, no BD      - RAST and IgE check on 07/23/09.  His IgE was 527.7. Chronic sinusitis Diverticulosis GERD Rheumatoid arthritis Prostate Cancer  Irritable bowel syndrome Anxiety Neurodermatitis Erectile dysfunction Obstructive sleep apnea      - BPAP 14/10 (12/09/09) Allergic rhinitis Hypertension  Past Surgical History: Reviewed history from 03/26/2010 and no changes required. Radical prostatectomy Left knee arthroscopy Sinus surgery 2009 (deviated septum/polyps) Urethral stricture repair  4/11  Dr Achilles Dunk  Family History: Reviewed history from 05/04/2009 and no changes required. Son - asthma, allergies Mother-deceased at age 59, heart  failure Father-deceased at age 27 pneumonia 4 sisters healthy  Social History: Reviewed history from 05/12/2008 and no changes required. 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   Lungs:  normal respiratory effort, no intercostal retractions, no accessory muscle use, normal breath sounds, no crackles, and no wheezes.   Abdomen:  soft, non-tender, and no masses.   Psych:  much less anxious   Impression & Recommendations:  Problem # 1:  ABDOMINAL PAIN, LEFT LOWER QUADRANT (ICD-789.04) Assessment Improved seems to be resolved with normalization of his bowels discussed ongoing miralax  Problem # 2:  ASTHMA (ICD-493.90) Assessment: Improved still doing well wtih this will try off the spiriva and he is seeing Dr Craige Cotta on Wednesday  His updated medication list for this problem includes:    Symbicort 160-4.5 Mcg/act Aero (Budesonide-formoterol fumarate) ..... Use 2 puffs two times a day as needed    Spiriva Handihaler 18 Mcg Caps (Tiotropium bromide monohydrate) .Marland Kitchen... 1 capsule in inhaler daily    Singulair 10 Mg Tabs (Montelukast sodium) .Marland Kitchen... 1 at bedtime    Albuterol Sulfate (2.5 Mg/30ml) 0.083% Nebu (Albuterol sulfate) ..... One vial nebulized up to four  times per day as needed    Ventolin Hfa 108 (90 Base) Mcg/act Aers (Albuterol sulfate) .Marland Kitchen..Marland Kitchen Two puffs up to four times per day as needed    Prednisone 5 Mg Tabs (Prednisone) .Marland Kitchen... Take 1 1/2 by mouth once daily  Problem # 3:  ANXIETY (ICD-300.00) Assessment: Improved better with weight gain and improved eating frustrated by need for Foley again No changes needed  His updated medication list for this problem includes:    Alprazolam 0.5 Mg Tabs (Alprazolam) .Marland Kitchen... 1-2 tabs by  mouth three times a day as needed for nerves  Complete Medication List: 1)  Symbicort 160-4.5 Mcg/act Aero (Budesonide-formoterol fumarate) .... Use 2 puffs two times a day as needed 2)  Spiriva Handihaler 18 Mcg Caps (Tiotropium bromide monohydrate) .Marland Kitchen.. 1 capsule in inhaler daily 3)  Singulair 10 Mg Tabs (Montelukast sodium) .Marland Kitchen.. 1 at bedtime 4)  Albuterol Sulfate (2.5 Mg/48ml) 0.083% Nebu (Albuterol sulfate) .... One vial nebulized up to four times per day as needed 5)  Ventolin Hfa 108 (90 Base) Mcg/act Aers (Albuterol sulfate) .... Two puffs up to four times per day as needed 6)  Prednisone 5 Mg Tabs (Prednisone) .... Take 1 1/2 by mouth once daily 7)  Toprol Xl 100 Mg Xr24h-tab (Metoprolol succinate) .... Take 1 tablet by mouth once a day 8)  Adult Aspirin Ec Low Strength 81 Mg Tbec (Aspirin) .... Take 1 tablet by mouth once a day 9)  Pravachol 20 Mg Tabs (Pravastatin sodium) .... Take 1 tablet by mouth once a day 10)  Temazepam 15 Mg Caps (Temazepam) .... Take 1or 2 at bedtime as needed to help sleep 11)  Multivitamins Tabs (Multiple vitamin) .... Take 1 tablet by mouth once a day 12)  Cozaar 50 Mg Tabs (Losartan potassium) .... Take 1 tablet by mouth once a day 13)  Furosemide 40 Mg Tabs (Furosemide) .Marland Kitchen.. 1 daily as needed for swelling in feet and legs 14)  Epipen 2-pak 0.3 Mg/0.52ml Devi (Epinephrine) .... Use as directed as needed for severe allergic reaction 15)  Alprazolam 0.5 Mg Tabs (Alprazolam) .Marland Kitchen.. 1-2 tabs by mouth three times a day as needed for nerves 16)  Hyoscyamine Sulfate 0.125 Mg Tabs (Hyoscyamine sulfate) .... Take 1 by mouth 4 times a day as needed for bladder or intestinal spasm 17)  Methotrexate 2.5 Mg Tabs (Methotrexate sodium) .... 8 tablets weekly for rheumatoid arthritis 18)  Rapaflo 8 Mg Caps (Silodosin) .... Take 1 by mouth once daily with breakfast  Patient Instructions: 1)  Please schedule a follow-up appointment in 1 month.  2)  Hold the spiriva for now to  see if it helps ease the urinary problems  Current Allergies (reviewed today): ! CIPRO ! ALTACE ! ZOCOR ! CELEXA ! ATIVAN ! PAXIL

## 2010-12-28 NOTE — Assessment & Plan Note (Signed)
Summary: FOLLOW UP FOR IBS   Vital Signs:  Patient profile:   77 year old male Height:      71 inches Weight:      196.38 pounds BMI:     27.49 Temp:     98.5 degrees F oral Pulse rate:   80 / minute Pulse rhythm:   regular BP sitting:   120 / 73  (left arm) Cuff size:   regular  Vitals Entered By: Linde Gillis CMA [name]) (March 26, 2010 2:54 PM) CC: follow-up visit   History of Present Illness: Catheter just removed this afternoon by Dr Achilles Dunk Hasn't really been able to void much as yet  Still concerned about his bowels has sense of fullness and he has to push has liquidy brown stools which seemed more full last night did get sense of relief with that  having some burning at his rectum uses preparation H for this Dr Achilles Dunk gave Rx for suppositories for pain  Still not feeling well No alcohol hasn't taken xanax---discouraged to use by Dr Achilles Dunk. Now okay to use if voids okay  On MTX since 4/5 He notes some improvement in his joint pain since then  Not eating well as yet  Breathing is so much better No ER visits since starting xolair  Allergies: 1)  ! Cipro 2)  ! Altace 3)  ! Zocor 4)  ! Celexa 5)  ! Ativan 6)  ! Paxil  Past History:  Past medical, surgical, family and social histories (including risk factors) reviewed for relevance to current acute and chronic problems.  Past Medical History: Reviewed history from 02/15/2010 and no changes required. CAD Asthma      - PFT 06/05/08 FEV1 2.84(93%), FVC 3.99(87%), FEV1% 71, TLC 6.78(97%), DLCO 105%, no BD      - RAST and IgE check on 07/23/09.  His IgE was 527.7. Chronic sinusitis Diverticulosis GERD Rheumatoid arthritis Prostate Cancer  Irritable bowel syndrome Anxiety Neurodermatitis Erectile dysfunction Obstructive sleep apnea      - BPAP 14/10 (12/09/09) Allergic rhinitis Hypertension  Past Surgical History: Radical prostatectomy Left knee arthroscopy Sinus surgery 2009 (deviated  septum/polyps) Urethral stricture repair  4/11  Dr Achilles Dunk  Family History: Reviewed history from 05/04/2009 and no changes required. Son - asthma, allergies Mother-deceased at age 58, heart failure Father-deceased at age 5 pneumonia 4 sisters healthy  Social History: Reviewed history from 05/12/2008 and no changes required. 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 Never Smoked Alcohol use-occasional Regular exercise-no  Review of Systems       not eating well Lost 7.5# in 3 weeks  Physical Exam  General:  alert.  NAD Neck:  supple, no masses, no thyromegaly, and no cervical lymphadenopathy.   Lungs:  normal respiratory effort, normal breath sounds, and no wheezes.   Abdomen:  soft, non-tender, normal bowel sounds, no masses, no guarding, and no rigidity.   Extremities:  no edema Psych:  normally interactive, good eye contact, and moderately anxious.     Impression & Recommendations:  Problem # 1:  ABDOMINAL PAIN, LEFT LOWER QUADRANT (ICD-789.04) Assessment Improved bowel seems to be empty discussed cutting back to miralax to daily lots related to his anxiety  Problem # 2:  ANXIETY (ICD-300.00) Assessment: Deteriorated worse with all his medical issues okay to have his glass of wine before supper--is due for liver tests on MTX soon xanax as needed   His updated medication list for this problem includes:    Alprazolam 0.5 Mg Tabs (Alprazolam) .Marland KitchenMarland KitchenMarland KitchenMarland Kitchen  1-2 tabs by mouth three times a day as needed for nerves  Problem # 3:  RHEUMATOID ARTHRITIS (ICD-714.0) Assessment: Improved a;ready has some improvement in pain and function on MTX  His updated medication list for this problem includes:    Prednisone 5 Mg Tabs (Prednisone) .Marland Kitchen... Take 2 by mouth once daily    Adult Aspirin Ec Low Strength 81 Mg Tbec (Aspirin) .Marland Kitchen... Take 1 tablet by mouth once a day    Methotrexate 2.5 Mg Tabs (Methotrexate sodium) .Marland KitchenMarland KitchenMarland KitchenMarland Kitchen 6 tablets weekly for rheumatoid arthritis  Problem # 4:   ASTHMA (ICD-493.90) Assessment: Improved markedly better on xolair  His updated medication list for this problem includes:    Symbicort 160-4.5 Mcg/act Aero (Budesonide-formoterol fumarate) ..... Use 2 puffs two times a day as needed    Spiriva Handihaler 18 Mcg Caps (Tiotropium bromide monohydrate) .Marland Kitchen... 1 capsule in inhaler daily    Singulair 10 Mg Tabs (Montelukast sodium) .Marland Kitchen... 1 at bedtime    Albuterol Sulfate (2.5 Mg/61ml) 0.083% Nebu (Albuterol sulfate) ..... One vial nebulized up to four times per day as needed    Ventolin Hfa 108 (90 Base) Mcg/act Aers (Albuterol sulfate) .Marland Kitchen..Marland Kitchen Two puffs up to four times per day as needed    Prednisone 5 Mg Tabs (Prednisone) .Marland Kitchen... Take 2 by mouth once daily  Complete Medication List: 1)  Symbicort 160-4.5 Mcg/act Aero (Budesonide-formoterol fumarate) .... Use 2 puffs two times a day as needed 2)  Spiriva Handihaler 18 Mcg Caps (Tiotropium bromide monohydrate) .Marland Kitchen.. 1 capsule in inhaler daily 3)  Singulair 10 Mg Tabs (Montelukast sodium) .Marland Kitchen.. 1 at bedtime 4)  Albuterol Sulfate (2.5 Mg/77ml) 0.083% Nebu (Albuterol sulfate) .... One vial nebulized up to four times per day as needed 5)  Ventolin Hfa 108 (90 Base) Mcg/act Aers (Albuterol sulfate) .... Two puffs up to four times per day as needed 6)  Prednisone 5 Mg Tabs (Prednisone) .... Take 2 by mouth once daily 7)  Toprol Xl 100 Mg Xr24h-tab (Metoprolol succinate) .... Take 1 tablet by mouth once a day 8)  Adult Aspirin Ec Low Strength 81 Mg Tbec (Aspirin) .... Take 1 tablet by mouth once a day 9)  Pravachol 20 Mg Tabs (Pravastatin sodium) .... Take 1 tablet by mouth once a day 10)  Temazepam 15 Mg Caps (Temazepam) .... Take 1or 2 at bedtime as needed to help sleep 11)  Multivitamins Tabs (Multiple vitamin) .... Take 1 tablet by mouth once a day 12)  Cozaar 50 Mg Tabs (Losartan potassium) .... Take 1 tablet by mouth once a day 13)  Furosemide 40 Mg Tabs (Furosemide) .Marland Kitchen.. 1 daily as needed for swelling  in feet and legs 14)  Epipen 2-pak 0.3 Mg/0.17ml Devi (Epinephrine) .... Use as directed as needed for severe allergic reaction 15)  Alprazolam 0.5 Mg Tabs (Alprazolam) .Marland Kitchen.. 1-2 tabs by mouth three times a day as needed for nerves 16)  Hyoscyamine Sulfate 0.125 Mg Tabs (Hyoscyamine sulfate) .... Take 1 by mouth 4 times a day as needed for bladder or intestinal spasm 17)  Methotrexate 2.5 Mg Tabs (Methotrexate sodium) .... 6 tablets weekly for rheumatoid arthritis  Patient Instructions: 1)  Please take the miralax daily now 2)  Please schedule a follow-up appointment in 2 weeks.   Current Allergies (reviewed today): ! CIPRO ! ALTACE ! ZOCOR ! CELEXA ! ATIVAN ! PAXIL

## 2010-12-28 NOTE — Assessment & Plan Note (Signed)
Summary: [facility]   Nurse Visit   Allergies: 1)  ! Cipro 2)  ! Altace 3)  ! Zocor 4)  ! Celexa 5)  ! Ativan 6)  ! Paxil  Medication Administration  Injection # 1:    Medication: Xolair (omalizumab) 150mg     Diagnosis: EXTRINSIC ASTHMA, UNSPECIFIED (ICD-493.00)    Route: IM    Site: R deltoid    Exp Date: 08/28/2013    Lot #: [id number]    Mfr: Genetech    Comments: xolair 375, 90 units; 1.0 ml x 2 in Right deltoid and 1.0 ml x 1 in Left deltoid. Patient waited 30 mins.    Given by: Dimas Millin, Allergy tech  Orders Added: 1)  Xolair (omalizumab) 150mg  [J2357] 2)  Administration xolair injection [phone]

## 2010-12-28 NOTE — Progress Notes (Signed)
Summary: Schedule NP3   Phone Note Outgoing Call Call back at [facility] Phone [phone]   Call placed by: Darcey Nora RN, CGRN,  May 03, 2010 1:51 PM Call placed to: Patient Summary of Call: Left message for patient to call back to discuss moving up his GI appointment Initial call taken by: Darcey Nora RN, CGRN,  May 03, 2010 1:51 PM  Follow-up for Phone Call        patient rescheduled to see Dr Russella Dar 05/05/10 9:45 Darcey Nora RN, [facility]  May 03, 2010 3:01 PM

## 2010-12-28 NOTE — Assessment & Plan Note (Signed)
Summary: xolair/apc   Nurse Visit   Allergies: 1)  ! Cipro 2)  ! Altace 3)  ! Zocor 4)  ! Celexa 5)  ! Ativan 6)  ! Paxil  Medication Administration  Injection # 1:    Medication: Xolair (omalizumab) 150mg     Diagnosis: EXTRINSIC ASTHMA, UNSPECIFIED (ICD-493.00)    Route: SQ    Site: R deltoid    Exp Date: 03/28/2013    Lot #: [id number]    Mfr: GENENTECH    Comments: 1.0 ML X 2 IN RIGHT ARM AND 1.0ML IN LEFT ARM PT WAITED 30 MINS    Patient tolerated injection without complications    Given by: TAMMY SCOTT IN ALLERGY LAB  Orders Added: 1)  Xolair (omalizumab) 150mg  [J2357] 2)  Administration xolair injection [[id number]]   Medication Administration  Injection # 1:    Medication: Xolair (omalizumab) 150mg     Diagnosis: EXTRINSIC ASTHMA, UNSPECIFIED (ICD-493.00)    Route: SQ    Site: R deltoid    Exp Date: 03/28/2013    Lot #: [id number]    Mfr: GENENTECH    Comments: 1.0 ML X 2 IN RIGHT ARM AND 1.0ML IN LEFT ARM PT WAITED 30 MINS    Patient tolerated injection without complications    Given by: TAMMY SCOTT IN ALLERGY LAB  Orders Added: 1)  Xolair (omalizumab) 150mg  [J2357] 2)  Administration xolair injection [[id number]]

## 2010-12-28 NOTE — Assessment & Plan Note (Signed)
Summary: xolair/mhh   Nurse Visit   Allergies: 1)  ! Cipro 2)  ! Altace 3)  ! Zocor 4)  ! Celexa 5)  ! Ativan 6)  ! Paxil  Medication Administration  Injection # 1:    Medication: Xolair (omalizumab) 150mg     Diagnosis: ASTHMA (ICD-493.90)    Route: SQ    Site: R deltoid    Exp Date: 03/28/2013    Lot #: [id number]    Mfr: GENENTECH    Comments: 1.0ML X 2 IN RIGHT ARM AND 1.0ML IN LEFT PT WAITED FOR 30 MINS    Patient tolerated injection without complications    Given by: TAMMY SCOTT IN ALLERGY LAB  Orders Added: 1)  Xolair (omalizumab) 150mg  [J2357] 2)  Administration xolair injection [id number]   Medication Administration  Injection # 1:    Medication: Xolair (omalizumab) 150mg     Diagnosis: ASTHMA (ICD-493.90)    Route: SQ    Site: R deltoid    Exp Date: 03/28/2013    Lot #: [id number]    Mfr: GENENTECH    Comments: 1.0ML X 2 IN RIGHT ARM AND 1.0ML IN LEFT PT WAITED FOR 30 MINS    Patient tolerated injection without complications    Given by: TAMMY SCOTT IN ALLERGY LAB  Orders Added: 1)  Xolair (omalizumab) 150mg  [J2357] 2)  Administration xolair injection [id number]  Appended Document: xolair/mhh This patients diagnosis is 493.00 not 493.90/skb

## 2010-12-28 NOTE — Assessment & Plan Note (Signed)
Summary: [email or web address].cb   Nurse Visit   Allergies: 1)  ! Cipro 2)  ! Altace 3)  ! Zocor 4)  ! Celexa 5)  ! Ativan 6)  ! Paxil  Medication Administration  Injection # 1:    Medication: Xolair (omalizumab) 150mg     Diagnosis: ASTHMA (ICD-493.90)    Route: SQ    Site: R deltoid    Exp Date: 03/28/2013    Lot #: [id number]    Mfr: genentech    Comments: 1.0ML X 2 IN RIGHT ARM 1.0ML X 1IN LEFT PT WAITED     Patient tolerated injection without complications    Given by: Glade Lloyd IN ALLERGY LAB  Orders Added: 1)  Xolair (omalizumab) 150mg  [J2357] 2)  Administration xolair injection [[id number]]   Medication Administration  Injection # 1:    Medication: Xolair (omalizumab) 150mg     Diagnosis: ASTHMA (ICD-493.90)    Route: SQ    Site: R deltoid    Exp Date: 03/28/2013    Lot #: [id number]    Mfr: genentech    Comments: 1.0ML X 2 IN RIGHT ARM 1.0ML X 1IN LEFT PT WAITED     Patient tolerated injection without complications    Given by: Glade Lloyd IN ALLERGY LAB  Orders Added: 1)  Xolair (omalizumab) 150mg  [J2357] 2)  Administration xolair injection [[id number]]  Appended Document: [email or web address].cb This patients diagnosis is 493.00 not 493.90/skb

## 2010-12-28 NOTE — Letter (Signed)
Summary: Dr.Brian Cope,[facility],Note  Dr.Brian Cope,[facility],Note   Imported By: Beau Fanny 04/22/2010 10:50:56  _____________________________________________________________________  External Attachment:    Type:   Image     Comment:   External Document

## 2010-12-28 NOTE — Miscellaneous (Signed)
Summary: Orders Update pft charges  Clinical Lists Changes  Orders: Added new Service order of Carbon Monoxide diffusing w/capacity (94720) - Signed Added new Service order of Lung Volumes (94240) - Signed Added new Service order of Spirometry (Pre & Post) (94060) - Signed 

## 2010-12-28 NOTE — Assessment & Plan Note (Signed)
Summary: ROA FOR FOLLOW-UP/JRR   Vital Signs:  Patient profile:   77 year old male Weight:      187 pounds Temp:     99 degrees F oral Pulse rate:   84 / minute Pulse rhythm:   regular BP sitting:   124 / 64  (left arm) Cuff size:   regular  Vitals Entered By: Lowella Petties CMA (June 14, 2010 12:49 PM) CC: follow-up visit   History of Present Illness: "miserable" with his bowels again last week despite miralax and senekot some watery bowels with "no substance" did touch base with Dr Russella Dar stopped senekot and started dulcolax and added magnesium citrate  Harder to eat Feels bloated and gets stomach pressure Notes a lot of stomach gurgling and lots of gas  Notes ongoing anxiety issues "my life is so stressful" Foley is constant nuisance ongoing arthritis pain  Has seen Dr Laymond Purser hasn't found this all that helpful after 2 visits Sleeps okay--often sleeps till late morning or noon (timing with wife who sleeps late and is handling his foely care)  Has neuro appt finally on August 12th  Clearly anhedonic "sleep is the highlight of my day"  Allergies: 1)  ! Cipro 2)  ! Altace 3)  ! Zocor 4)  ! Celexa 5)  ! Ativan 6)  ! Paxil  Past History:  Past medical, surgical, family and social histories (including risk factors) reviewed for relevance to current acute and chronic problems.  Past Medical History: CAD Asthma      - PFT 04/07/10 FEV1 2.61(86%), FVC 3.60(78%), FEV1% 73, TLC 7.26(105%), DLCO 112%, no BD      - RAST and IgE check on 07/23/09.  His IgE was 527.7. Chronic sinusitis Diverticulosis GERD Rheumatoid arthritis Prostate Cancer  Irritable bowel syndrome Anxiety Neurodermatitis Erectile dysfunction Obstructive sleep apnea      - BPAP 14/10 (12/09/09)>>stopped after weight loss Allergic rhinitis Hypertension Adenomatous Colon Polyps 1990 Depression  Past Surgical History: Reviewed history from 03/26/2010 and no changes required. Radical  prostatectomy Left knee arthroscopy Sinus surgery 2009 (deviated septum/polyps) Urethral stricture repair  4/11  Dr Achilles Dunk  Family History: Reviewed history from 05/04/2009 and no changes required. Son - asthma, allergies Mother-deceased at age 57, heart failure Father-deceased at age 20 pneumonia 4 sisters healthy  Social History: Reviewed history from 05/12/2008 and no changes required. 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 Never Smoked Alcohol use-occasional Regular exercise-no  Review of Systems       weight down 3# Reviewed his food diary  Physical Exam  Psych:  perseverating about need for CT scan How bad life is, etc not overly anxious Insight is not really impaired but isn't great either Judgement doesn't appear impaired   Impression & Recommendations:  Problem # 1:  DEPRESSION (ICD-311) Assessment New clearly anhedonic spending a lot of time in bed incredible somatic issues along with sig actual medical issues  will start mirtazapine see back next week to reeval  counselled for all of 30 minute visit  His updated medication list for this problem includes:    Alprazolam 0.5 Mg Tabs (Alprazolam) .Marland Kitchen... 1-2 tabs by mouth three times a day as needed for nerves    Mirtazapine 15 Mg Tabs (Mirtazapine) .Marland Kitchen... 1 tab at bedtime for depression and poor eating  Complete Medication List: 1)  Levsin/sl 0.125 Mg Subl (Hyoscyamine sulfate) .Marland Kitchen.. 1-2 tablets by mouth under tongue or by mouth four times a day as needed 2)  Symbicort 160-4.5 Mcg/act Aero (Budesonide-formoterol fumarate) .Marland KitchenMarland KitchenMarland Kitchen  2 puffs two times a day 3)  Albuterol Sulfate (2.5 Mg/80ml) 0.083% Nebu (Albuterol sulfate) .... One vial nebulized up to four times per day as needed 4)  Ventolin Hfa 108 (90 Base) Mcg/act Aers (Albuterol sulfate) .... Two puffs up to four times per day as needed 5)  Methotrexate 2.5 Mg Tabs (Methotrexate sodium) .... 9 tablets weekly for rheumatoid arthritis 6)  Prednisone 5  Mg Tabs (Prednisone) .... Take 1  by mouth once daily 7)  Metoprolol Succinate 50 Mg Xr24h-tab (Metoprolol succinate) .Marland Kitchen.. 1 tab daily for high blood pressure 8)  Adult Aspirin Ec Low Strength 81 Mg Tbec (Aspirin) .... Take 1 tablet by mouth once a day 9)  Temazepam 15 Mg Caps (Temazepam) .... Take 1or 2 at bedtime as needed to help sleep 10)  Alprazolam 0.5 Mg Tabs (Alprazolam) .Marland Kitchen.. 1-2 tabs by mouth three times a day as needed for nerves 11)  Multivitamins Tabs (Multiple vitamin) .... Take 1 tablet by mouth once a day 12)  Senokot S 8.6-50 Mg Tabs (Sennosides-docusate sodium) .... One tablet by mouth two times a day 13)  Miralax Powd (Polyethylene glycol 3350) .Marland KitchenMarland KitchenMarland Kitchen 17 grams in 8 oz of water 1-2 x daily 14)  Epipen 2-pak 0.3 Mg/0.51ml Devi (Epinephrine) .... Use as directed as needed for severe allergic reaction 15)  Omeprazole 20 Mg Tbec (Omeprazole) .... Take 1 by mouth once daily 16)  Mirtazapine 15 Mg Tabs (Mirtazapine) .Marland Kitchen.. 1 tab at bedtime for depression and poor eating  Patient Instructions: 1)  Please schedule a follow-up appointment in 1 week---  THursday at 1:45PM is okay Prescriptions: ALPRAZOLAM 0.5 MG TABS (ALPRAZOLAM) 1-2 tabs by mouth three times a day as needed for nerves  #100 x 0   Entered and Authorized by:   Cindee Salt MD   Signed by:   Cindee Salt MD on 06/14/2010   Method used:   Print then Give to Patient   RxID:   [id number] MIRTAZAPINE 15 MG TABS (MIRTAZAPINE) 1 tab at bedtime for depression and poor eating  #30 x 5   Entered and Authorized by:   Cindee Salt MD   Signed by:   Cindee Salt MD on 06/14/2010   Method used:   Electronically to        [facility]  [address] (retail)       [address], [address]       [place], [place]  [address]       Ph: [phone]       Fax: [phone]   RxID:   [id number]   Prior Medications (reviewed today): LEVSIN/SL 0.125 MG SUBL (HYOSCYAMINE SULFATE)  1-2 tablets by mouth under tongue or by mouth four times a day as needed SYMBICORT 160-4.5 MCG/ACT AERO (BUDESONIDE-FORMOTEROL FUMARATE) 2 puffs two times a day ALBUTEROL SULFATE (2.5 MG/3ML) 0.083% NEBU (ALBUTEROL SULFATE) one vial nebulized up to four times per day as needed VENTOLIN HFA 108 (90 BASE) MCG/ACT AERS (ALBUTEROL SULFATE) two puffs up to four times per day as needed METHOTREXATE 2.5 MG TABS (METHOTREXATE SODIUM) 9 tablets weekly for rheumatoid arthritis PREDNISONE 5 MG TABS (PREDNISONE) take 1  by mouth once daily METOPROLOL SUCCINATE 50 MG XR24H-TAB (METOPROLOL SUCCINATE) 1 tab daily for high blood pressure ADULT ASPIRIN EC LOW STRENGTH 81 MG  TBEC (ASPIRIN) Take 1 tablet by mouth once a day TEMAZEPAM 15 MG  CAPS (TEMAZEPAM) Take 1or 2 at bedtime as needed  to help sleep MULTIVITAMINS   TABS (MULTIPLE VITAMIN) Take 1 tablet by mouth once a day SENOKOT S 8.6-50 MG TABS (SENNOSIDES-DOCUSATE SODIUM) one tablet by mouth two times a day MIRALAX  POWD (POLYETHYLENE GLYCOL 3350) 17 grams in 8 oz of water 1-2 x daily EPIPEN 2-PAK 0.3 MG/0.3ML DEVI (EPINEPHRINE) use as directed as needed for severe allergic reaction OMEPRAZOLE 20 MG TBEC (OMEPRAZOLE) take 1 by mouth once daily Current Allergies: ! CIPRO ! ALTACE ! ZOCOR ! CELEXA ! ATIVAN ! PAXIL

## 2010-12-28 NOTE — Letter (Signed)
Summary: Dr.Brian Cope,[facility],Note  Dr.Brian Cope,[facility],Note   Imported By: Beau Fanny 04/05/2010 14:54:51  _____________________________________________________________________  External Attachment:    Type:   Image     Comment:   External Document  Appended Document: Dr.Brian Cope,[facility],Note foley had to be reinserted  started on rapaflo hopes to get him off spiriva since it may be exacerbating urinary retention

## 2010-12-28 NOTE — Letter (Signed)
Summary: [facility]  [facility]   Imported By: Lanelle Bal 03/11/2010 11:11:39  _____________________________________________________________________  External Attachment:    Type:   Image     Comment:   External Document  Appended Document: [facility] cysto confirms bladder neck contracture Plans outpatient procedure at [facility] to correct

## 2010-12-28 NOTE — Medication Information (Signed)
Summary: Xolair Approved/[employer]  Xolair Approved/[employer]   Imported By: Sherian Rein 08/16/2010 11:41:58  _____________________________________________________________________  External Attachment:    Type:   Image     Comment:   External Document

## 2010-12-28 NOTE — Assessment & Plan Note (Signed)
Summary: 1 m f/u dlo   Vital Signs:  Patient profile:   77 year old male Weight:      198 pounds Temp:     98.0 degrees F oral Pulse rate:   78 / minute Pulse rhythm:   regular BP sitting:   120 / 60  (left arm) Cuff size:   large  Vitals Entered By: Mervin Hack CMA [name]) (July 26, 2010 3:27 PM) CC: 1 month follow-up   History of Present Illness: Still concerned about pain in left calf still has pain Does note ongoing swelling--despite elevation Not hot anymore Having pain when walking on it  Still has general arthritic pain--arms, shoulders and knees  Had pelvic MRI--hasn't heard results Ongoing bladder problems--still with indwelling catheter bowels have been somewhat better--hasn't needed the mag citrate in a couple of weeks Miralax once (usually, but occ twice) and 2 dulcolax daily  Asthma has been fine no cough feels some mucus but has skipped the budesinide in the last few days  Allergies: 1)  ! Cipro 2)  ! Altace 3)  ! Zocor 4)  ! Celexa 5)  ! Ativan 6)  ! Paxil  Past History:  Past medical, surgical, family and social histories (including risk factors) reviewed for relevance to current acute and chronic problems.  Past Medical History: Reviewed history from 06/14/2010 and no changes required. CAD Asthma      - PFT 04/07/10 FEV1 2.61(86%), FVC 3.60(78%), FEV1% 73, TLC 7.26(105%), DLCO 112%, no BD      - RAST and IgE check on 07/23/09.  His IgE was 527.7. Chronic sinusitis Diverticulosis GERD Rheumatoid arthritis Prostate Cancer  Irritable bowel syndrome Anxiety Neurodermatitis Erectile dysfunction Obstructive sleep apnea      - BPAP 14/10 (12/09/09)>>stopped after weight loss Allergic rhinitis Hypertension Adenomatous Colon Polyps 1990 Depression  Past Surgical History: Reviewed history from 03/26/2010 and no changes required. Radical prostatectomy Left knee arthroscopy Sinus surgery 2009 (deviated septum/polyps) Urethral  stricture repair  4/11  Dr Achilles Dunk  Family History: Reviewed history from 05/04/2009 and no changes required. Son - asthma, allergies Mother-deceased at age 68, heart failure Father-deceased at age 16 pneumonia 4 sisters healthy  Social History: Reviewed history from 05/12/2008 and no changes required. 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 Never Smoked Alcohol use-occasional Regular exercise-no  Review of Systems       appetite is good weight up 5# more   Physical Exam  General:  alert and normal appearance.   Extremities:  1-2+  edema in left ankle and calf Slight calf tenderness Skin:  redness on left calf is gone Psych:  normally interactive, good eye contact, not anxious appearing, and not depressed appearing.     Impression & Recommendations:  Problem # 1:  CELLULITIS, LEFT LEG (ICD-682.6) Assessment Improved infection seems gone still with edema---looks to be venous insufficiency had ultrasound negative for DVT at the start of the episode so no reason to consider DVT discussed using support socks  His updated medication list for this problem includes:    Clindamycin Hcl 300 Mg Caps (Clindamycin hcl) .Marland Kitchen... Take one tablet by mouth three times a day  Problem # 2:  NEUROGENIC BLADDER (ICD-596.54) Assessment: Unchanged awaiting results from MRI gabapentin seems to help some of the discomfort  Problem # 3:  DEPRESSION (ICD-311) Assessment: Improved better now that he has gained back some weight  His updated medication list for this problem includes:    Alprazolam 0.5 Mg Tabs (Alprazolam) .Marland Kitchen... 1-2 tabs by mouth  three times a day as needed for nerves    Mirtazapine 15 Mg Tabs (Mirtazapine) .Marland Kitchen... 1 tab at bedtime for depression and poor eating  Problem # 4:  CONSTIPATION (ICD-564.00) Assessment: Improved better on current regimen  His updated medication list for this problem includes:    Miralax Powd (Polyethylene glycol 3350) .Marland KitchenMarland KitchenMarland KitchenMarland Kitchen 17 grams in 8  oz of water 1-2 x daily    Dulcolax Stool Softener 100 Mg Caps (Docusate sodium) .Marland Kitchen... 2 by mouth two times a day  Complete Medication List: 1)  Levsin/sl 0.125 Mg Subl (Hyoscyamine sulfate) .Marland Kitchen.. 1-2 tablets by mouth under tongue or by mouth four times a day as needed 2)  Symbicort 160-4.5 Mcg/act Aero (Budesonide-formoterol fumarate) .... 2 puffs two times a day 3)  Xolair 150 Mg Solr (Omalizumab) .... 375 mg every two weeks subcutaneously 4)  Albuterol Sulfate (2.5 Mg/53ml) 0.083% Nebu (Albuterol sulfate) .... One vial nebulized up to four times per day as needed 5)  Ventolin Hfa 108 (90 Base) Mcg/act Aers (Albuterol sulfate) .... Two puffs up to four times per day as needed 6)  Methotrexate 2.5 Mg Tabs (Methotrexate sodium) .... 9 tablets weekly for rheumatoid arthritis 7)  Prednisone 5 Mg Tabs (Prednisone) .... Take 1  by mouth once daily 8)  Metoprolol Succinate 50 Mg Xr24h-tab (Metoprolol succinate) .Marland Kitchen.. 1 tab daily for high blood pressure 9)  Temazepam 15 Mg Caps (Temazepam) .... Take 1or 2 at bedtime as needed to help sleep 10)  Alprazolam 0.5 Mg Tabs (Alprazolam) .Marland Kitchen.. 1-2 tabs by mouth three times a day as needed for nerves 11)  Omeprazole 20 Mg Tbec (Omeprazole) .... Take 1 by mouth once daily 12)  Mirtazapine 15 Mg Tabs (Mirtazapine) .Marland Kitchen.. 1 tab at bedtime for depression and poor eating 13)  Pravastatin Sodium 20 Mg Tabs (Pravastatin sodium) .Marland Kitchen.. 1 tab daily for high cholesterol 14)  Hydroxychloroquine Sulfate 200 Mg Tabs (Hydroxychloroquine sulfate) .Marland Kitchen.. 1 tab by mouth two times a day for rheumatoid arthritis 15)  Gabapentin 300 Mg Caps (Gabapentin) .Marland Kitchen.. 1 tab by mouth three times a day for neuropathy 16)  Clindamycin Hcl 300 Mg Caps (Clindamycin hcl) .... Take one tablet by mouth three times a day 17)  Adult Aspirin Ec Low Strength 81 Mg Tbec (Aspirin) .... Take 1 tablet by mouth once a day 18)  Multivitamins Tabs (Multiple vitamin) .... Take 1 tablet by mouth once a day 19)  Miralax  Powd (Polyethylene glycol 3350) .Marland KitchenMarland KitchenMarland Kitchen 17 grams in 8 oz of water 1-2 x daily 20)  Epipen 2-pak 0.3 Mg/0.60ml Devi (Epinephrine) .... Use as directed as needed for severe allergic reaction 21)  Dulcolax Stool Softener 100 Mg Caps (Docusate sodium) .... 2 by mouth two times a day  Patient Instructions: 1)  Please try the support socks 2)  Please schedule a follow-up appointment in 3 months .   Current Allergies (reviewed today): ! CIPRO ! ALTACE ! ZOCOR ! CELEXA ! ATIVAN ! PAXIL

## 2010-12-28 NOTE — Progress Notes (Signed)
Summary: constipation   Phone Note Call from Patient Call back at Home Phone [phone] Call back at [phone]   Caller: Patient Call For: Cindee Salt MD Summary of Call: Patient says that he is having problems with constipation again. Constant gas and stomach pains. He has tried miralax and has tried  dulcolax tabs 3 times daily and still not having any relief. He says that he has been eating foods high in fiber and is not helping either. He says that he is getting scared and needs some relief.  Uses [facility] [address] if needed.  Initial call taken by: Melody Comas,  March 22, 2010 2:47 PM  Follow-up for Phone Call        still all fixated on his bowels  discussed that the dulcolax will cause spasm Had not been taking the miralax !  I asked him to take the miralax three times a day until stools loose and he wanted to cut back Sit briefly on cammode without straining no dulcolax! Follow-up by: Cindee Salt MD,  March 22, 2010 5:47 PM

## 2010-12-28 NOTE — Miscellaneous (Signed)
Summary: Care Plan/[facility]  Care Plan/[facility]   Imported By: Lanelle Bal 01/27/2010 11:46:56  _____________________________________________________________________  External Attachment:    Type:   Image     Comment:   External Document

## 2010-12-28 NOTE — Assessment & Plan Note (Signed)
Summary: xolair/apc   Nurse Visit   Allergies: 1)  ! Cipro 2)  ! Altace 3)  ! Zocor 4)  ! Celexa 5)  ! Ativan 6)  ! Paxil  Medication Administration  Injection # 1:    Medication: Xolair (omalizumab) 150mg     Diagnosis: EXTRINSIC ASTHMA, UNSPECIFIED (ICD-493.00)    Route: SQ    Site: R deltoid    Exp Date: 01/2013    Lot #: [id number]    Mfr: Mendel Ryder    Comments: Injection given by Drucie Opitz, CMA in allergy lab. Xolair 375mg . 1.62ml x 2 in Right Deltoid and 1.0 ml x 1 in Left Deltoid. Pt waited 30 minutes.     Patient tolerated injection without complications  Orders Added: 1)  Admin of Therapeutic Inj  intramuscular or subcutaneous [96372] 2)  Xolair (omalizumab) 150mg  [J2357]

## 2010-12-28 NOTE — Assessment & Plan Note (Signed)
Summary: F/U [facility] ER ON 05/19/10/CLE   Vital Signs:  Patient profile:   77 year old male Weight:      195 pounds O2 Sat:      99 % on Room air Temp:     97.8 degrees F oral Pulse rate:   78 / minute Pulse rhythm:   regular BP sitting:   148 / 78  (left arm) Cuff size:   large  Vitals Entered By: Mervin Hack CMA [name]) (May 24, 2010 12:41 PM)  O2 Flow:  Room air CC: hospital follow-up   History of Present Illness: Had catheter removed at urologist cystoscopy was benign and concern for radiation based nerve damage Went back to urologist and they thought he could void okay so sent home again Gave them red rubber catheter for as needed use  Developed severe pain last Wednesday--the 22nd in and out cath negative but pain continued Taken to ER by rescue 800cc in bladder in ER then Catheter left in --to leg bag  Colonoscopy was benign instructed to continue miralax and senekot as well as citrucel still not "normal"----occ gets some cramping  Allergies: 1)  ! Cipro 2)  ! Altace 3)  ! Zocor 4)  ! Celexa 5)  ! Ativan 6)  ! Paxil  Past History:  Past medical, surgical, family and social histories (including risk factors) reviewed for relevance to current acute and chronic problems.  Past Medical History: Reviewed history from 05/04/2010 and no changes required. CAD Asthma      - PFT 04/07/10 FEV1 2.61(86%), FVC 3.60(78%), FEV1% 73, TLC 7.26(105%), DLCO 112%, no BD      - RAST and IgE check on 07/23/09.  His IgE was 527.7. Chronic sinusitis Diverticulosis GERD Rheumatoid arthritis Prostate Cancer  Irritable bowel syndrome Anxiety Neurodermatitis Erectile dysfunction Obstructive sleep apnea      - BPAP 14/10 (12/09/09) Allergic rhinitis Hypertension Adenomatous Colon Polyps 1990  Past Surgical History: Reviewed history from 03/26/2010 and no changes required. Radical prostatectomy Left knee arthroscopy Sinus surgery 2009 (deviated  septum/polyps) Urethral stricture repair  4/11  Dr Achilles Dunk  Family History: Reviewed history from 05/04/2009 and no changes required. Son - asthma, allergies Mother-deceased at age 87, heart failure Father-deceased at age 60 pneumonia 4 sisters healthy  Social History: Reviewed history from 05/12/2008 and no changes required. 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 Never Smoked Alcohol use-occasional Regular exercise-no  Review of Systems       weight up 4# appetite is some better anxiety is ongoing has tingling in feet hands are cold--that bothers him   Impression & Recommendations:  Problem # 1:  NEUROGENIC BLADDER (ICD-596.54) Assessment Comment Only  appears to be the diagnosis had been having increasing problems voiding prompted procedure for urethral stricture which didn't help now ongoing retention may be the cause of the bowel problems as well  Counselled most of 30 minute visit  Orders: Neurology Referral (Neuro)  Problem # 2:  ANXIETY (ICD-300.00)  mentions concerns about pancreas cancer, bone cancer (due to non specific left arm pain) anxiety is going wild  need to deal with most pressing issue with bladder and try to let the rest recede in his conciousness  His updated medication list for this problem includes:    Alprazolam 0.5 Mg Tabs (Alprazolam) .Marland Kitchen... 1-2 tabs by mouth three times a day as needed for nerves  Orders: Psychology Referral (Psychology)  Complete Medication List: 1)  Levsin/sl 0.125 Mg Subl (Hyoscyamine sulfate) .Marland Kitchen.. 1-2 tablets by mouth under  tongue or by mouth four times a day as needed 2)  Singulair 10 Mg Tabs (Montelukast sodium) .Marland Kitchen.. 1 at bedtime 3)  Albuterol Sulfate (2.5 Mg/77ml) 0.083% Nebu (Albuterol sulfate) .... One vial nebulized up to four times per day as needed 4)  Ventolin Hfa 108 (90 Base) Mcg/act Aers (Albuterol sulfate) .... Two puffs up to four times per day as needed 5)  Methotrexate 2.5 Mg Tabs  (Methotrexate sodium) .... 8 tablets weekly for rheumatoid arthritis 6)  Prednisone 5 Mg Tabs (Prednisone) .... Take 1 1/2 by mouth once daily 7)  Temazepam 15 Mg Caps (Temazepam) .... Take 1or 2 at bedtime as needed to help sleep 8)  Epipen 2-pak 0.3 Mg/0.48ml Devi (Epinephrine) .... Use as directed as needed for severe allergic reaction 9)  Alprazolam 0.5 Mg Tabs (Alprazolam) .Marland Kitchen.. 1-2 tabs by mouth three times a day as needed for nerves 10)  Adult Aspirin Ec Low Strength 81 Mg Tbec (Aspirin) .... Take 1 tablet by mouth once a day 11)  Multivitamins Tabs (Multiple vitamin) .... Take 1 tablet by mouth once a day 12)  Senokot S 8.6-50 Mg Tabs (Sennosides-docusate sodium) .... One tablet by mouth two times a day 13)  Miralax Powd (Polyethylene glycol 3350) .Marland KitchenMarland KitchenMarland Kitchen 17 grams in 8 oz of water 1-2 x daily 14)  Metoprolol Succinate 50 Mg Xr24h-tab (Metoprolol succinate) .Marland Kitchen.. 1 tab daily for high blood pressure  Patient Instructions: 1)  Please schedule a follow-up appointment in 1 month.  2)  Referral Appointment Information 3)  Day/Date: 4)  Time: 5)  Place/MD: 6)  Address: 7)  Phone/Fax: 8)  Patient given appointment information. Information/Orders faxed/mailed. 9)  Please set up appt with Dr Laymond Purser Prescriptions: METOPROLOL SUCCINATE 50 MG XR24H-TAB (METOPROLOL SUCCINATE) 1 tab daily for high blood pressure  #90 x 3   Entered and Authorized by:   Cindee Salt MD   Signed by:   Cindee Salt MD on 05/24/2010   Method used:   Electronically to        [facility] [address] (retail)       [address], [address]       [place], [place]  [address]       Ph: [phone]       Fax: [phone]   RxID:   [id number]   Current Allergies (reviewed today): ! CIPRO ! ALTACE ! ZOCOR ! CELEXA ! ATIVAN ! PAXIL

## 2010-12-28 NOTE — Consult Note (Signed)
Summary: [facility]  Dr.G.Sammie Bench   Imported By: Beau Fanny 02/12/2010 16:18:31  _____________________________________________________________________  External Attachment:    Type:   Image     Comment:   External Document  Appended Document: Dr.G.Sammie Bench severe aggressive RA wants permission to use MTX  Appended Document: Dr.G.Sammie Bench He has well preserved DLCO from PFT in 2009.  He has obstructive lung disease based on asthma.  I do not see any specific pulmonary contra-indications to his use of methotrexate for treatment of his rheumatoid arthritis.  He will need periodic monitoring of his pulmonary function while on methotrexate, and I will arrange for this.  Appended Document: Dr.G.Sammie Bench Please call Dr Guillerm[place] office to pass on Dr Evlyn Courier comments  Appended Document: Dr.G.Sammie Bench spoke with Harriett Sine at dr. Guillerm[place] office and faxed note from Dr. Craige Cotta.

## 2010-12-28 NOTE — Miscellaneous (Signed)
Summary: Injection Record / [facility]    Injection Record / [facility]    Imported By: Lennie Odor 07/30/2010 09:53:10  _____________________________________________________________________  External Attachment:    Type:   Image     Comment:   External Document

## 2010-12-28 NOTE — Assessment & Plan Note (Signed)
Summary: ?UTI/CLE   Vital Signs:  Patient profile:   77 year old male Weight:      207 pounds Temp:     97.6 degrees F oral BP sitting:   136 / 70  (left arm) Cuff size:   large  Vitals Entered By: Mervin Hack CMA [name]) (February 22, 2010 3:49 PM) CC: ?UTI   History of Present Illness: "I've never felt sicker in my life"  Has had urinary frequency since 2 days ago going as often as twice an hour No dysuria No hematuria Gets sense of incomplete bladder emptying---this sensation has been bothering him quite a bit Not sleeping well due to this feels weak and tired as well  RA has worsened considerably Dr Gavin Potters considering the alternatives may start MTX  No fever but gets cold easy Some abdominal pain with the increased frequency no recent ejaculation  Has been taking cranberry tabs  Allergies: 1)  ! Cipro 2)  ! Altace 3)  ! Zocor 4)  ! Celexa 5)  ! Ativan 6)  ! Paxil  Past History:  Past medical, surgical, family and social histories (including risk factors) reviewed for relevance to current acute and chronic problems.  Past Medical History: Reviewed history from 02/15/2010 and no changes required. CAD Asthma      - PFT 06/05/08 FEV1 2.84(93%), FVC 3.99(87%), FEV1% 71, TLC 6.78(97%), DLCO 105%, no BD      - RAST and IgE check on 07/23/09.  His IgE was 527.7. Chronic sinusitis Diverticulosis GERD Rheumatoid arthritis Prostate Cancer  Irritable bowel syndrome Anxiety Neurodermatitis Erectile dysfunction Obstructive sleep apnea      - BPAP 14/10 (12/09/09) Allergic rhinitis Hypertension  Past Surgical History: Reviewed history from 05/04/2009 and no changes required. Radical prostatectomy Left knee arthroscopy Sinus surgery 2009 (deviated septum/polyps)  Family History: Reviewed history from 05/04/2009 and no changes required. Son - asthma, allergies Mother-deceased at age 50, heart failure Father-deceased at age 65 pneumonia 4 sisters  healthy  Social History: Reviewed history from 05/12/2008 and no changes required. 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  NAD Abdomen:  soft.  Mild suprapubic tenderness Genitalia:  no scrotal masses, no testicular masses or atrophy, and no cutaneous lesions.   Msk:  hands are swollen--esp in joints difficulty getting up on table uses walker   Impression & Recommendations:  Problem # 1:  ACUTE CYSTITIS (ICD-595.0) Assessment New  classic symptoms urinalysis is negative scrotum quiet no prostate ??irritative  will try 3 days of antibiotic urology eval if persists  His updated medication list for this problem includes:    Sulfamethoxazole-tmp Ds 800-160 Mg Tabs (Sulfamethoxazole-trimethoprim) .Marland Kitchen... 1 tab by mouth two times a day for bladder infection  Orders: UA Dipstick w/o Micro (manual) ([address])  Complete Medication List: 1)  Symbicort 160-4.5 Mcg/act Aero (Budesonide-formoterol fumarate) .... Use 2 puffs two times a day as needed 2)  Spiriva Handihaler 18 Mcg Caps (Tiotropium bromide monohydrate) .Marland Kitchen.. 1 capsule in inhaler daily 3)  Singulair 10 Mg Tabs (Montelukast sodium) .Marland Kitchen.. 1 at bedtime 4)  Albuterol Sulfate (2.5 Mg/94ml) 0.083% Nebu (Albuterol sulfate) .... One vial nebulized up to four times per day as needed 5)  Ventolin Hfa 108 (90 Base) Mcg/act Aers (Albuterol sulfate) .... Two puffs up to four times per day as needed 6)  Prednisone 5 Mg Tabs (Prednisone) .Marland KitchenMarland KitchenMarland Kitchen  Use as directed 7)  Toprol Xl 100 Mg Xr24h-tab (Metoprolol succinate) .... Take 1 tablet by mouth once a day 8)  Adult Aspirin Ec Low Strength 81 Mg Tbec (Aspirin) .... Take 1 tablet by mouth once a day 9)  Pravachol 20 Mg Tabs (Pravastatin sodium) .... Take 1 tablet by mouth once a day 10)  Temazepam 15 Mg Caps (Temazepam) .... Take 1or 2 at  bedtime as needed to help sleep 11)  Multivitamins Tabs (Multiple vitamin) .... Take 1 tablet by mouth once a day 12)  Cozaar 50 Mg Tabs (Losartan potassium) .... Take 1 tablet by mouth once a day 13)  Furosemide 40 Mg Tabs (Furosemide) .Marland Kitchen.. 1 daily as needed for swelling in feet and legs 14)  Epipen 2-pak 0.3 Mg/0.104ml Devi (Epinephrine) .... Use as directed as needed for severe allergic reaction 15)  Alprazolam 0.5 Mg Tabs (Alprazolam) .Marland Kitchen.. 1-2 tabs by mouth three times a day as needed for nerves 16)  Plaquenil 200 Mg Tabs (Hydroxychloroquine sulfate) .Marland Kitchen.. 1 by mouth two times a day 17)  Sulfamethoxazole-tmp Ds 800-160 Mg Tabs (Sulfamethoxazole-trimethoprim) .Marland Kitchen.. 1 tab by mouth two times a day for bladder infection  Patient Instructions: 1)  Please take the antibioitic 2)  If urine symptoms are much better within the first couple of doses, please take for 3 days. Otherwise, take for the full 7 days 3)  Keep your regular appointment Prescriptions: SULFAMETHOXAZOLE-TMP DS 800-160 MG TABS (SULFAMETHOXAZOLE-TRIMETHOPRIM) 1 tab by mouth two times a day for bladder infection  #14 x 0   Entered and Authorized by:   Cindee Salt MD   Signed by:   Cindee Salt MD on 02/22/2010   Method used:   Electronically to        [facility] (retail)       [address], [address]       [place], [place]  [address]       Ph: [phone]       Fax: [phone]   RxID:   [id number]   Current Allergies (reviewed today): ! CIPRO ! ALTACE ! ZOCOR ! CELEXA ! ATIVAN ! PAXIL  Laboratory Results   Urine Tests  Date/Time Received: February 22, 2010 3:54 PM Date/Time Reported: February 22, 2010 3:54 PM  Routine Urinalysis   Color: yellow Appearance: Hazy Glucose: negative   (Normal Range: Negative) Bilirubin: negative   (Normal Range: Negative) Ketone: negative   (Normal Range: Negative) Spec. Gravity: 1.015   (Normal Range: 1.003-1.035) Blood:  trace-lysed   (Normal Range: Negative) pH: 7.0   (Normal Range: 5.0-8.0) Protein: negative   (Normal Range: Negative) Urobilinogen: 0.2   (Normal Range: 0-1) Nitrite: negative   (Normal Range: Negative) Leukocyte Esterace: negative   (Normal Range: Negative)

## 2010-12-28 NOTE — Progress Notes (Signed)
Summary: still having problems with constipation  Phone Note Call from Patient Call back at [phone]   Caller: Patient Call For: Cindee Salt MD Summary of Call: Pt is still having problems with constipation.  He is taking miralax  every 2 hours and still has pain and has not had a normal BM.  His last BM was on monday.  He is asking what else he can do, asks what he should be eating.  Advised high fiber foods- advised fruits, vegetables, grain breads and cereal- and that I would ask you. Initial call taken by: Lowella Petties CMA,  March 17, 2010 3:40 PM  Follow-up for Phone Call        If he has continually taken the miralax, he can now try a dulcolax suppository or tablet---this may help clear him out at this point Follow-up by: Cindee Salt MD,  March 17, 2010 7:32 PM  Additional Follow-up for Phone Call Additional follow up Details #1::        pt now states he is getting some relief but he feels like it should all come out at once, he getting small stools throughout the day and he feels like there is still some in there? pt would like to know how to get rid of IBS? or how to start back to having normal stools? please advise. DeShannon Smith CMA [name])  March 18, 2010 1:40 PM   really seems to have cleaned out today No need to push No pain appetite still not back to normal  P: continue miralax daily and adjust based on how he does. Slowly advance diet  Had ureteral procedure. Went well Has Foley for the next week Additional Follow-up by: Cindee Salt MD,  March 18, 2010 1:53 PM

## 2010-12-28 NOTE — Assessment & Plan Note (Signed)
Summary: xolair/apc   Nurse Visit   Allergies: 1)  ! Cipro 2)  ! Altace 3)  ! Zocor 4)  ! Celexa 5)  ! Ativan 6)  ! Paxil  Medication Administration  Injection # 1:    Medication: Xolair (omalizumab) 150mg     Diagnosis: ASTHMA (ICD-493.90)    Route: SQ    Site: L deltoid    Exp Date: 01/28/2013    Lot #: [id number]    Mfr: GENENTECH    Comments: 1.2ML IN LEFT ARM X 2  AND 1.0 ML IN RIGHT ARM PT WAITED 30 MINS     Patient tolerated injection without complications    Given by: TAMMY SCOTT IN [facility]  Orders Added: 1)  Xolair (omalizumab) 150mg  [J2357] 2)  Administration xolair injection [53664]   Medication Administration  Injection # 1:    Medication: Xolair (omalizumab) 150mg     Diagnosis: ASTHMA (ICD-493.90)    Route: SQ    Site: L deltoid    Exp Date: 01/28/2013    Lot #: [id number]    Mfr: GENENTECH    Comments: 1.2ML IN LEFT ARM X 2  AND 1.0 ML IN RIGHT ARM PT WAITED 30 MINS     Patient tolerated injection without complications    Given by: TAMMY SCOTT IN [facility]  Orders Added: 1)  Xolair (omalizumab) 150mg  [J2357] 2)  Administration xolair injection [id number]  Appended Document: xolair/apc This patients diagnosis is 493.00 not 493.90/skb

## 2010-12-28 NOTE — Procedures (Signed)
Summary: Colonoscopy  Patient: [name] Note: All result statuses are Final unless otherwise noted.  Tests: (1) Colonoscopy (COL)   COL Colonoscopy           DONE     [facility]     [address]     [place], [place]  [address]           COLONOSCOPY PROCEDURE REPORT           PATIENT:  [name], [name]  MR#:  [id number]     BIRTHDATE:  24-Oct-1934, 75 yrs. old  GENDER:  male     ENDOSCOPIST:  Judie Petit T. Russella Dar, MD, [facility]           PROCEDURE DATE:  05/18/2010     PROCEDURE:  Colonoscopy 84132     ASA CLASS:  Class III     INDICATIONS:  1) constipation  2) follow-up of polyp, adenomatous     polyp, 1990.     MEDICATIONS:   Fentanyl 125 mcg IV, Versed 10 mg IV, Benadryl 50     mg IV     DESCRIPTION OF PROCEDURE:   After the risks benefits and     alternatives of the procedure were thoroughly explained, informed     consent was obtained.  Digital rectal exam was performed and     revealed no abnormalities.   The LB [id number] endoscope     was introduced through the anus and advanced to the cecum, which     was identified by both the appendix and ileocecal valve, without     limitations.  The quality of the prep was good, using MoviPrep.     The instrument was then slowly withdrawn as the colon was fully     examined.     <<PROCEDUREIMAGES>>           FINDINGS:  Moderate diverticulosis was found in the sigmoid to     descending colon segments.  A normal appearing cecum, ileocecal     valve, and appendiceal orifice were identified. The ascending,     hepatic flexure, transverse, splenic flexure, and rectum appeared     unremarkable. Retroflexed views in the rectum revealed no     abnormalities.  The time to cecum =  3.5  minutes. The scope was     then withdrawn (time =  11.25  min) from the patient and the     procedure completed.           COMPLICATIONS:  None           ENDOSCOPIC IMPRESSION:     1) Moderate diverticulosis in the sigmoid to descending colon         RECOMMENDATIONS:     1) High fiber diet with liberal fluid intake.     2) Other recomendations per my recent office note     3) Follow up with Dr. Alphonsus Sias as planned     4) No plans for future surveillance colonoscopies given     comorbidities           Judie Petit T. Russella Dar, MD, [facility]           CC: Karie Schwalbe, MD           n.     Rosalie DoctorVenita Lick. Jacarie Pate at 05/18/2010 02:06 PM           Frazier Richards, [id number]  Note: An exclamation mark (!) indicates a result that was  not dispersed into the flowsheet. Document Creation Date: 05/18/2010 2:07 PM _______________________________________________________________________  (1) Order result status: Final Collection or observation date-time: 05/18/2010 14:00 Requested date-time:  Receipt date-time:  Reported date-time:  Referring Physician:   Ordering Physician: Claudette Head [phone]) Specimen Source:  Source: Launa Grill Order Number: [id number] Lab site:

## 2010-12-28 NOTE — Assessment & Plan Note (Signed)
Summary: 4-6 weeks & xolair/apc   Copy to:  Dr. Andee Poles Primary Provider/Referring Provider:  Tillman Abide  CC:  Asthma.  The patient says his breathing is doing well overall. No sinus complaints. The patient does c/o arthritis flare-up today.Marland Kitchen  History of Present Illness: 77 year old male with known history of  asthma, OSA on BPAP, and rhinits.  His breathing has been doing better.  However, today he thinks he was exposed to the cold air outside, and feels he has more wheezing.  Otherwise he has not been having much cough or congestion.  He will bring up clear sputum in the morning.  He will occasionally get winded if he exerts himself too much.  He has been on prednisone 5 mg once daily for the past two weeks.  He was seen by Dr. Despina Hick with orthopedics for his knee pain.  Dr. Despina Hick expressed concern about him having surgery due to his lung disease.  Currently his main complaint is his stiffness and swelling in his hands.  This has become more prominent as his prednisone dose has been decreased.  He has been doing well with his BPAP, and seems to have adjusted better since the pressure was decrease.   Current Medications (verified): 1)  Symbicort 160-4.5 Mcg/act Aero (Budesonide-Formoterol Fumarate) .... Use 2 Puffs Two Times A Day As Needed 2)  Spiriva Handihaler 18 Mcg Caps (Tiotropium Bromide Monohydrate) .Marland Kitchen.. 1 Capsule in Inhaler Daily 3)  Singulair 10 Mg Tabs (Montelukast Sodium) .Marland Kitchen.. 1 At Bedtime 4)  Albuterol Sulfate (2.5 Mg/96ml) 0.083% Nebu (Albuterol Sulfate) .... One Vial Nebulized Up To Four Times Per Day As Needed 5)  Proair Hfa 108 (90 Base) Mcg/act Aers (Albuterol Sulfate) .... Two Puffs Up To Four Times Per Day As Needed 6)  Prednisone 5 Mg Tabs (Prednisone) .... Use As Directed 7)  Toprol Xl 100 Mg Xr24h-Tab (Metoprolol Succinate) .... Take 1 Tablet By Mouth Once A Day 8)  Adult Aspirin Ec Low Strength 81 Mg  Tbec (Aspirin) .... Take 1 Tablet By Mouth Once A Day 9)   Pravachol 20 Mg  Tabs (Pravastatin Sodium) .... Take 1 Tablet By Mouth Once A Day 10)  Temazepam 15 Mg  Caps (Temazepam) .... Take 1or 2 At Bedtime As Needed To Help Sleep 11)  Multivitamins   Tabs (Multiple Vitamin) .... Take 1 Tablet By Mouth Once A Day 12)  Cozaar 50 Mg Tabs (Losartan Potassium) .... Take 1 Tablet By Mouth Once A Day 13)  Furosemide 40 Mg Tabs (Furosemide) .Marland Kitchen.. 1 Daily As Needed For Swelling in Feet and Legs 14)  Epipen 2-Pak 0.3 Mg/0.45ml Devi (Epinephrine) .... Use As Directed As Needed For Severe Allergic Reaction 15)  Alprazolam 0.5 Mg Tabs (Alprazolam) .Marland Kitchen.. 1-2 Tabs By Mouth Three Times A Day As Needed For Nerves  Allergies (verified): 1)  ! Cipro 2)  ! Altace 3)  ! Zocor 4)  ! Celexa 5)  ! Ativan 6)  ! Paxil  Past History:  Past Medical History: Last updated: 12/07/2009 Asthma      - RAST and IgE check on 07/23/09.  His IgE was 527.7. ? CAD, no prior cath but abnormal stress test in past Chronic sinusitis Diverticulosis GERD Rheumatoid arthritis Prostate Cancer  Irritable bowel syndrome Anxiety Neurodermatitis Erectile dysfunction Sleep apnea      - BPAP 14/10 (12/09/09) Allergic rhinitis Hypertension  Past Surgical History: Last updated: 05/04/2009 Radical prostatectomy Left knee arthroscopy Sinus surgery 2009 (deviated septum/polyps)  Vital Signs:  Patient  profile:   77 year old male Height:      71 inches (180.34 cm) Weight:      224.50 pounds (102.05 kg) BMI:     31.42 O2 Sat:      94 % on Room air Temp:     97.4 degrees F (36.33 degrees C) oral Pulse rate:   111 / minute BP sitting:   124 / 72  (right arm) Cuff size:   large  Vitals Entered By: Michel Bickers CMA (December 31, 2009 12:08 PM)  O2 Sat at Rest %:  94 O2 Flow:  Room air  Physical Exam  General:  obese Nose:  clear nasal discharge, no sinus tenderness Mouth:  no oral lesion Neck:  no JVD.   Lungs:  coarse breath sounds, no wheeze Heart:  regular rhythm, normal  rate, and no murmurs.   Abdomen:  obese, soft Extremities:  mild swollen joints in hands, minimal ankle edema Cervical Nodes:  no significant adenopathy   Impression & Recommendations:  Problem # 1:  ASTHMA (ICD-493.90) He has been doing well, and has tolerated the reduction in the dose of his prednisone.  I think much of this improvement is related to use of xolair.  He prefers to have an inhaler with a dose counter, and will therefore change his rescue inhaler to ventolin.  He is to continue his other inhaler regimen.  With regards to his asthma he could probably tolerate further reduction in his prednisone dose, but I am concerned that his prednisone use is controlling his arthritis symptoms, and would be worried that this will get worse if we decreased prednisone further at this time.  Problem # 2:  ALLERGIC RHINITIS (ICD-477.9)  This is stable.  Will continue current regimen.  Problem # 3:  OBSTRUCTIVE SLEEP APNEA (ICD-327.23)  He has been doing better with his BPAP since the pressure was decreased.  Explained that we are trying to balance controlling his sleep apnea versus allowing him to sleep with his set up.  Problem # 4:  RHEUMATOID ARTHRITIS (ICD-714.0)  I am concerned that his arthritis is flaring up as his dose of prednisone has been decreased.  As a result will continue his current dose of prednisone until he can follow up with Dr. Alphonsus Sias.  Problem # 5:  ANXIETY (ICD-300.00) He has done well with xanax.  He likely develops dynamic hyperinflation with worsening dyspnea when he gets anxious.  I had explained the use of purse-lip breathing to assist with this also.  Medications Added to Medication List This Visit: 1)  Ventolin Hfa 108 (90 Base) Mcg/act Aers (Albuterol sulfate) .... Two puffs up to four times per day as needed  Complete Medication List: 1)  Symbicort 160-4.5 Mcg/act Aero (Budesonide-formoterol fumarate) .... Use 2 puffs two times a day as needed 2)  Spiriva  Handihaler 18 Mcg Caps (Tiotropium bromide monohydrate) .Marland Kitchen.. 1 capsule in inhaler daily 3)  Singulair 10 Mg Tabs (Montelukast sodium) .Marland Kitchen.. 1 at bedtime 4)  Albuterol Sulfate (2.5 Mg/60ml) 0.083% Nebu (Albuterol sulfate) .... One vial nebulized up to four times per day as needed 5)  Ventolin Hfa 108 (90 Base) Mcg/act Aers (Albuterol sulfate) .... Two puffs up to four times per day as needed 6)  Prednisone 5 Mg Tabs (Prednisone) .... Use as directed 7)  Toprol Xl 100 Mg Xr24h-tab (Metoprolol succinate) .... Take 1 tablet by mouth once a day 8)  Adult Aspirin Ec Low Strength 81 Mg Tbec (Aspirin) .... Take 1  tablet by mouth once a day 9)  Pravachol 20 Mg Tabs (Pravastatin sodium) .... Take 1 tablet by mouth once a day 10)  Temazepam 15 Mg Caps (Temazepam) .... Take 1or 2 at bedtime as needed to help sleep 11)  Multivitamins Tabs (Multiple vitamin) .... Take 1 tablet by mouth once a day 12)  Cozaar 50 Mg Tabs (Losartan potassium) .... Take 1 tablet by mouth once a day 13)  Furosemide 40 Mg Tabs (Furosemide) .Marland Kitchen.. 1 daily as needed for swelling in feet and legs 14)  Epipen 2-pak 0.3 Mg/0.61ml Devi (Epinephrine) .... Use as directed as needed for severe allergic reaction 15)  Alprazolam 0.5 Mg Tabs (Alprazolam) .Marland Kitchen.. 1-2 tabs by mouth three times a day as needed for nerves  Other Orders: Xolair (omalizumab) 150mg  (Z6109) Admin of Therapeutic Inj  intramuscular or subcutaneous (60454) Est. Patient Level III (09811)  Patient Instructions: 1)  Follow up in 6 to 8 weeks Prescriptions: VENTOLIN HFA 108 (90 BASE) MCG/ACT AERS (ALBUTEROL SULFATE) two puffs up to four times per day as needed  #1 x 6   Entered and Authorized by:   Coralyn Helling MD   Signed by:   Coralyn Helling MD on 12/31/2009   Method used:   Electronically to        [facility]  [address] (retail)       [address]       [place], [place]  [address]       Ph: [phone]       Fax: [phone]   RxID:    [id number]    Medication Administration  Injection # 1:    Medication: Xolair (omalizumab) 150mg     Diagnosis: EXTRINSIC ASTHMA, UNSPECIFIED (ICD-493.00)    Route: SQ    Site: left arm    Exp Date: 01/2013    Lot #: [id number]    Mfr: Genetech    Comments: Xolair 325mg . Given by Dimas Millin in the allergy lab.  Patient here for office visit with Dr. Craige Cotta. 1.53ml x2 in left arm and 1.42ml in right arm    Patient tolerated injection without complications  Orders Added: 1)  Xolair (omalizumab) 150mg  [J2357] 2)  Admin of Therapeutic Inj  intramuscular or subcutaneous [96372] 3)  Est. Patient Level III [47425]

## 2010-12-28 NOTE — Miscellaneous (Signed)
Summary: Orders Update   Clinical Lists Changes       Medication Administration  Injection # 1:    Medication: Xolair (omalizumab) 150mg     Diagnosis: 493.00    Route: SQ    Site: L deltoid    Exp Date: 06/2013    Lot #: [id number]    Mfr: Genetech    Comments: 1.0 ML IN LEFT AND 1.0 ML X 2 IN RIGHT CHARGED J2357 AND 99833    Given by: Drucie Opitz IN ALLERGY LAB

## 2010-12-28 NOTE — Progress Notes (Signed)
Summary: new diagnosis code  Phone Note Other Incoming Call back at [phone]   Caller: Lowella Bandy from [facility] Summary of Call: Lowella Bandy from [facility] is asking for a new diagnosis code the wheel chair. She says that the one she has is inactive. She can be reached at [phone]. Initial call taken by: Melody Comas,  January 21, 2010 11:10 AM  Follow-up for Phone Call        Can use both degenerative joint disease (715.90) as well as rheumatoid arthritis 714.0  does she need another Rx? Follow-up by: Cindee Salt MD,  January 21, 2010 1:16 PM  Additional Follow-up for Phone Call Additional follow up Details #1::        spoke with [facility] and advised new codes Additional Follow-up by: Mervin Hack CMA [name]),  January 21, 2010 4:16 PM

## 2010-12-28 NOTE — Letter (Signed)
Summary: Surgical Clearence Letter  [facility]  [address]   [place], [place] [address]   Phone: [phone]  Fax: [phone]      03/09/2010 MRN: [id number]  [facility] [address] [place], [place]  [address]   Primary Provider/Referring Provider: Madolyn Frieze. Cope   Pulmonary Provider: Coralyn [name]   Dear Dr. Achilles Dunk,  [name] is followed in the pulmonary office for his chronic obstructive asthma, and obstructive sleep apnea.  His asthma has been difficult to control until recently when he was started on xolair.  Since then he has been doing better with his asthma.  His OSA is well controlled on BPAP 14/10 cm H2O.    I do not think there are any pulmonary contra-indications to his having the urologic procedure you are planning for him.  I would recommend that he continue with his inhaler regimen.  He is currently using Symbicort 160/4.5 two puffs two times a day, Spiriva one puff once daily, Singulair 10 mg at bedtime,  and albuterol 2.5 mg nebulized four times per day as needed.  He will need to have close monitoring of his oxygenation during the peri-operative period.  The procedure should be performed in a facility that has access to all necessary equipment to control his airway.  He should be continued on therapy for his sleep apnea during the peri-operative period.  I recommend that he use auto-BPAP with a pressure range of 5 to 25 cm H2O until he is fully recovered from the effects of anesthesia.  He can then be transitioned to his home BPAP regimen of 14/10 cm H2O.  Please do not hesitate to call if we could be of further assistance.       Sincerely,   Willie Helling MD

## 2010-12-28 NOTE — Assessment & Plan Note (Signed)
Summary: xolair/pt coming at 1:00/apc   Nurse Visit   Allergies: 1)  ! Cipro 2)  ! Altace 3)  ! Zocor 4)  ! Celexa 5)  ! Ativan 6)  ! Paxil  Medication Administration  Injection # 1:    Medication: Xolair (omalizumab) 150mg     Diagnosis: EXTRINSIC ASTHMA, UNSPECIFIED (ICD-493.00)    Route: SQ    Site: R deltoid    Exp Date: 11/2012    Lot #: [id number]    Mfr: Mendel Ryder    Comments: Injection given by Dimas Millin in allergy lab. Xolair 375mg . 1.38ml x 2 in Right and 1.86ml x 1 in Left Deltoid. Pt waited 30 minutes.     Patient tolerated injection without complications  Orders Added: 1)  Admin of Therapeutic Inj  intramuscular or subcutaneous [96372] 2)  Xolair (omalizumab) 150mg  [J2357]

## 2010-12-28 NOTE — Assessment & Plan Note (Signed)
Summary: xolair/apc   Nurse Visit   Allergies: 1)  ! Cipro 2)  ! Altace 3)  ! Zocor 4)  ! Celexa 5)  ! Ativan 6)  ! Paxil  Medication Administration  Injection # 1:    Medication: Xolair (omalizumab) 150mg     Diagnosis: ASTHMA (ICD-493.90)    Route: IM    Site: R deltoid    Exp Date: 06/28/2013    Lot #: [id number]    Mfr: Genetech    Comments: Xolair 375 mg, 90 units. 1.58ml x 2 in left deltoid, 1.89ml x1 in right deltoid.  Pt waited 20 mins. manu Genentech    Given by: Carver Fila, clinical medical assistant  Orders Added: 1)  Xolair (omalizumab) 150mg  [J2357] 2)  Administration xolair injection [57846]   Medication Administration  Injection # 1:    Medication: Xolair (omalizumab) 150mg     Diagnosis: ASTHMA (ICD-493.90)    Route: IM    Site: R deltoid    Exp Date: 06/28/2013    Lot #: [id number]    Mfr: Genetech    Comments: Xolair 375 mg, 90 units. 1.46ml x 2 in left deltoid, 1.15ml x1 in right deltoid.  Pt waited 20 mins. manu [employer]    Given by: Carver Fila, clinical medical assistant  Orders Added: 1)  Xolair (omalizumab) 150mg  [J2357] 2)  Administration xolair injection [phone]

## 2010-12-28 NOTE — Miscellaneous (Signed)
Summary: PT Discharge/[facility]  PT Discharge/[facility]   Imported By: Lanelle Bal 02/25/2010 12:43:12  _____________________________________________________________________  External Attachment:    Type:   Image     Comment:   External Document

## 2010-12-28 NOTE — Miscellaneous (Signed)
Summary: Orders Update   Clinical Lists Changes       Medication Administration  Injection # 1:    Medication: Xolair (omalizumab) 150mg     Diagnosis: 493.00    Route: SQ    Site: R deltoid    Exp Date: 06/2013    Lot #: [id number]    Mfr: Salome Spotted    Comments: 1.0 ML X 2 IN RIGHT ARM AND 1.0 ML IN LEFT ARM CHARGED [id number] AND J2357    Patient tolerated injection without complications    Given by: TAMMY SCOTT IN ALLERGY LAB

## 2010-12-28 NOTE — Assessment & Plan Note (Signed)
Summary: 3-4 weeks/apc   Copy to:  Dr. Andee Poles Primary Provider/Referring Provider:  Tillman Abide  CC:  Asthma and OSA follow-up.  Patient states his breathing is better on Spiriva and would like to discuss CPAP titrate.Marland Kitchen  History of Present Illness: 77 year old male with known history of  asthma, OSA on BPAP, and rhinits.  His breathing has been doing better.  He has managed to keep himself out of the hospital and emergency room.  He feels the addition of spiriva has helped.  He is using xanax once daily, and this helps.  He feels that he is now getting too much pressure from his BPAP machine.  His sinuses have been okay.  BPAP download showed reasonable control with 14/10 cmH2O  Current Medications (verified): 1)  Symbicort 160-4.5 Mcg/act Aero (Budesonide-Formoterol Fumarate) .... Use 2 Puffs Two Times A Day As Needed 2)  Singulair 10 Mg Tabs (Montelukast Sodium) .Marland Kitchen.. 1 At Bedtime 3)  Ipratropium-Albuterol 0.5-2.5 (3) Mg/56ml Soln (Ipratropium-Albuterol) .Marland Kitchen.. 1 Vial Via Nebulizer Up To 4 Times Daily For Asthma As Needed 4)  Combivent 103-18 Mcg/act Aero (Ipratropium-Albuterol) .... Inhale 2 Puffs Every Four Hours As Needed 5)  Allegra 180 Mg Tabs (Fexofenadine Hcl) .... Take 1 By Mouth Once Daily 6)  Plaquenil 200 Mg  Tabs (Hydroxychloroquine Sulfate) .... Take 1 Tablet By Mouth Two Times A Day 7)  Toprol Xl 100 Mg Xr24h-Tab (Metoprolol Succinate) .... Take 1 Tablet By Mouth Once A Day 8)  Adult Aspirin Ec Low Strength 81 Mg  Tbec (Aspirin) .... Take 1 Tablet By Mouth Once A Day 9)  Pravachol 20 Mg  Tabs (Pravastatin Sodium) .... Take 1 Tablet By Mouth Once A Day 10)  Temazepam 15 Mg  Caps (Temazepam) .... Take 1or 2 At Bedtime As Needed To Help Sleep 11)  Hydrocodone-Homatropine 5-1.5 Mg/37ml Syrp (Hydrocodone-Homatropine) .Marland Kitchen.. 1-2 Teaspoons Three Times A Day As Needed For Bad Cough 12)  Multivitamins   Tabs (Multiple Vitamin) .... Take 1 Tablet By Mouth Once A Day 13)  Prilosec 20  Mg Cpdr (Omeprazole) .... Take One By Mouth Daily 14)  Cozaar 50 Mg Tabs (Losartan Potassium) .... Take 1 Tablet By Mouth Once A Day 15)  Albuterol Sulfate (2.5 Mg/50ml) 0.083% Nebu (Albuterol Sulfate) .Marland Kitchen.. 1 Via Nebulizer Up To Four Times Daily As Needed For Wheezing or Sob 16)  Furosemide 40 Mg Tabs (Furosemide) .Marland Kitchen.. 1 Daily As Needed For Swelling in Feet and Legs 17)  Epipen 2-Pak 0.3 Mg/0.11ml Devi (Epinephrine) .... Use As Directed As Needed For Severe Allergic Reaction 18)  Prednisone 20 Mg Tabs (Prednisone) .... Take 1 By Mouth Two Times A Day 19)  Alprazolam 0.5 Mg Tabs (Alprazolam) .Marland Kitchen.. 1-2 Tabs By Mouth Three Times A Day As Needed For Nerves 20)  Spiriva Handihaler 18 Mcg Caps (Tiotropium Bromide Monohydrate) .Marland Kitchen.. 1 Capsule in Inhaler Daily  Allergies (verified): 1)  ! Cipro 2)  ! Altace 3)  ! Zocor 4)  ! Celexa 5)  ! Ativan 6)  ! Paxil  Past History:  Past Medical History: Asthma      - RAST and IgE check on 07/23/09.  His IgE was 527.7. ? CAD, no prior cath but abnormal stress test in past Chronic sinusitis Diverticulosis GERD Rheumatoid arthritis Prostate Cancer  Irritable bowel syndrome Anxiety Neurodermatitis Erectile dysfunction Sleep apnea      - BPAP 14/10 (12/09/09) Allergic rhinitis Hypertension  Past Surgical History: Reviewed history from 05/04/2009 and no changes required. Radical prostatectomy Left knee  arthroscopy Sinus surgery 2009 (deviated septum/polyps)  Vital Signs:  Patient profile:   77 year old male Height:      71 inches (180.34 cm) Weight:      223 pounds (101.36 kg) BMI:     31.21 O2 Sat:      95 % on Room air Temp:     97.9 degrees F (36.61 degrees C) oral Pulse rate:   78 / minute BP sitting:   142 / 84  (left arm) Cuff size:   large  Vitals Entered By: Michel Bickers CMA (December 07, 2009 2:13 PM)  O2 Flow:  Room air  Physical Exam  General:  obese Nose:  clear nasal discharge, no sinus tenderness Mouth:  no oral  lesion Neck:  no JVD.   Lungs:  coarse breath sounds, no wheeze Heart:  regular rhythm, normal rate, and no murmurs.   Abdomen:  obese, soft Extremities:  mild swollen joints in hands, minimal ankle edema Cervical Nodes:  no significant adenopathy   Impression & Recommendations:  Problem # 1:  ASTHMA (ICD-493.90) He appears to have improved since starting xolair and spiriva.  Will try to wean his dose of prednisone as tolerated.  Will continue other current therapies for now, but will have him switch from duoneb/combivent to proair/albuterol.  Problem # 2:  ALLERGIC RHINITIS (ICD-477.9)  He is to continue his sinus regimen.  Problem # 3:  OBSTRUCTIVE SLEEP APNEA (ICD-327.23)  Will decrease his BPAP pressure to 14/10 cm H2O, and monitor his status.  Medications Added to Medication List This Visit: 1)  Spiriva Handihaler 18 Mcg Caps (Tiotropium bromide monohydrate) .Marland Kitchen.. 1 capsule in inhaler daily 2)  Albuterol Sulfate (2.5 Mg/5ml) 0.083% Nebu (Albuterol sulfate) .... One vial nebulized up to four times per day as needed 3)  Proair Hfa 108 (90 Base) Mcg/act Aers (Albuterol sulfate) .... Two puffs up to four times per day as needed 4)  Prednisone 5 Mg Tabs (Prednisone) .... Use as directed  Complete Medication List: 1)  Symbicort 160-4.5 Mcg/act Aero (Budesonide-formoterol fumarate) .... Use 2 puffs two times a day as needed 2)  Spiriva Handihaler 18 Mcg Caps (Tiotropium bromide monohydrate) .Marland Kitchen.. 1 capsule in inhaler daily 3)  Singulair 10 Mg Tabs (Montelukast sodium) .Marland Kitchen.. 1 at bedtime 4)  Allegra 180 Mg Tabs (Fexofenadine hcl) .... Take 1 by mouth once daily 5)  Albuterol Sulfate (2.5 Mg/74ml) 0.083% Nebu (Albuterol sulfate) .... One vial nebulized up to four times per day as needed 6)  Proair Hfa 108 (90 Base) Mcg/act Aers (Albuterol sulfate) .... Two puffs up to four times per day as needed 7)  Prednisone 5 Mg Tabs (Prednisone) .... Use as directed 8)  Prednisone 20 Mg Tabs  (Prednisone) .... Take 1 by mouth two times a day 9)  Plaquenil 200 Mg Tabs (Hydroxychloroquine sulfate) .... Take 1 tablet by mouth two times a day 10)  Toprol Xl 100 Mg Xr24h-tab (Metoprolol succinate) .... Take 1 tablet by mouth once a day 11)  Adult Aspirin Ec Low Strength 81 Mg Tbec (Aspirin) .... Take 1 tablet by mouth once a day 12)  Pravachol 20 Mg Tabs (Pravastatin sodium) .... Take 1 tablet by mouth once a day 13)  Temazepam 15 Mg Caps (Temazepam) .... Take 1or 2 at bedtime as needed to help sleep 14)  Hydrocodone-homatropine 5-1.5 Mg/67ml Syrp (Hydrocodone-homatropine) .Marland Kitchen.. 1-2 teaspoons three times a day as needed for bad cough 15)  Multivitamins Tabs (Multiple vitamin) .... Take 1 tablet by mouth  once a day 16)  Prilosec 20 Mg Cpdr (Omeprazole) .... Take one by mouth daily 17)  Cozaar 50 Mg Tabs (Losartan potassium) .... Take 1 tablet by mouth once a day 18)  Furosemide 40 Mg Tabs (Furosemide) .Marland Kitchen.. 1 daily as needed for swelling in feet and legs 19)  Epipen 2-pak 0.3 Mg/0.34ml Devi (Epinephrine) .... Use as directed as needed for severe allergic reaction 20)  Alprazolam 0.5 Mg Tabs (Alprazolam) .Marland Kitchen.. 1-2 tabs by mouth three times a day as needed for nerves  Other Orders: Est. Patient Level III (24401) DME Referral (DME)  Patient Instructions: 1)  Predisone 15 mg once daily for one week, then 10 mg once daily for one week, then 5 mg once daily  2)  Spiriva one puff once daily 3)  Symbicort two puffs two times a day 4)  Proair two puffs up to four times per day as needed 5)  Albuterol nebulized up to four times per day as needed 6)  stop duoneb and combivent 7)  Singulair 10 mg at bedtime 8)  Follow up in 4 to 6 weeks Prescriptions: SPIRIVA HANDIHALER 18 MCG CAPS (TIOTROPIUM BROMIDE MONOHYDRATE) 1 capsule in inhaler daily  #30 x 3   Entered and Authorized by:   Coralyn Helling MD   Signed by:   Coralyn Helling MD on 12/07/2009   Method used:   Electronically to        [facility]  [address] (retail)       [address]       [place], [place]  [address]       Ph: [phone]       Fax: [phone]   RxID:   [id number] PREDNISONE 5 MG TABS (PREDNISONE) use as directed  #30 x 3   Entered and Authorized by:   Coralyn Helling MD   Signed by:   Coralyn Helling MD on 12/07/2009   Method used:   Electronically to        [facility] [address] (retail)       [address]       [place], [place]  [address]       Ph: [phone]       Fax: [phone]   RxID:   [id number] PROAIR HFA 108 (90 BASE) MCG/ACT AERS (ALBUTEROL SULFATE) two puffs up to four times per day as needed  #1 x 3   Entered and Authorized by:   Coralyn Helling MD   Signed by:   Coralyn Helling MD on 12/07/2009   Method used:   Electronically to        [facility] [address] (retail)       [address], [address]       [place], [place]  [address]       Ph: [phone]       Fax: [phone]   RxID:   [id number]

## 2010-12-28 NOTE — Miscellaneous (Signed)
Summary: [facility]-Notification of Home Health Discharge  [facility]-Notification of Home Health Discharge   Imported By: Beau Fanny 02/19/2010 09:22:57  _____________________________________________________________________  External Attachment:    Type:   Image     Comment:   External Document

## 2010-12-28 NOTE — Assessment & Plan Note (Signed)
Summary: xolair/apc   Nurse Visit   Allergies: 1)  ! Cipro 2)  ! Altace 3)  ! Zocor 4)  ! Celexa 5)  ! Ativan 6)  ! Paxil  Medication Administration  Injection # 1:    Medication: Xolair (omalizumab) 150mg     Diagnosis: EXTRINSIC ASTHMA, UNSPECIFIED (ICD-493.00)    Route: IM    Site: L deltoid    Exp Date: 08/28/2013    Lot #: [id number]    Mfr: [employer]et[employer]    Comments: xolair 375 mg, units 90, 1.0 ml x 2 in Left deltoid and 1.0 ml x 1 in right deltoid. Pt waited 30 minutes.    Given by: Dimas Millin, Allergy Tech  Orders Added: 1)  Administration xolair injection [id number] 2)  Xolair (omalizumab) 150mg  [J2357]

## 2010-12-28 NOTE — Letter (Signed)
Summary: Dr.G.Lyna Poser  Dr.G.Lyna Poser   Imported By: Beau Fanny 04/06/2010 16:30:37  _____________________________________________________________________  External Attachment:    Type:   Image     Comment:   External Document  Appended Document: Dr.G.Lyna Poser some response with RA to MTX increasing dose to 20mg  weekly

## 2010-12-28 NOTE — Assessment & Plan Note (Signed)
Summary: xolair/ mbw   Nurse Visit   Allergies: 1)  ! Cipro 2)  ! Altace 3)  ! Zocor 4)  ! Celexa 5)  ! Ativan 6)  ! Paxil  Medication Administration  Injection # 1:    Medication: Xolair (omalizumab) 150mg     Diagnosis: EXTRINSIC ASTHMA, UNSPECIFIED (ICD-493.00)    Route: SQ    Site: L deltoid    Exp Date: 11/2012    Lot #: [id number]    Mfr: Mendel Ryder    Comments: Injection given by Dimas Millin in allergy lab. Xolair 375mg . 1.0 ml x 2 in Left and 1.36ml x 1 in Right Deltoid. Pt waited 30 minutes.     Patient tolerated injection without complications  Orders Added: 1)  Admin of Therapeutic Inj  intramuscular or subcutaneous [96372] 2)  Xolair (omalizumab) 150mg  [J2357]

## 2010-12-28 NOTE — Assessment & Plan Note (Signed)
Summary: xolair/apc   Nurse Visit   Allergies: 1)  ! Cipro 2)  ! Altace 3)  ! Zocor 4)  ! Celexa 5)  ! Ativan 6)  ! Paxil  Medication Administration  Injection # 1:    Medication: Xolair (omalizumab) 150mg     Diagnosis: EXTRINSIC ASTHMA, UNSPECIFIED (ICD-493.00)    Route: IM    Site: R deltoid    Exp Date: 06/28/2013    Lot #: [id number]    Mfr: [employer]    Comments: xolair 375 mg, 90 units, 1.0 ml x 2 in Left deltoid,and 1.0 ml x 1 in right deltoid Pt. waited 30 mins.    Given by: Tammy Scott, allergy tech  Orders Added: 1)  Xolair (omalizumab) 150mg  [J2357] 2)  Administration xolair injection [phone]

## 2010-12-28 NOTE — Progress Notes (Signed)
Summary: pt is asking for home physical therapy  Phone Note Call from Patient   Caller: Patient Call For: Cindee Salt MD Summary of Call: Pt states he has severe arthritis in his knees, which is making him practically homebound.  He is asking for a home physical therapy referral.  He tried to get  that before but he was not homebound at the time, so insurance wouldnt approve it. He says he is much worse now.  Please advise. Initial call taken by: Lowella Petties CMA,  January 05, 2010 4:09 PM  Follow-up for Phone Call        I agree Unable to pursue surgery so will get home assessment for PT [facility] unless he has other preference Follow-up by: Cindee Salt MD,  January 05, 2010 5:06 PM  Additional Follow-up for Phone Call Additional follow up Details #1::        REferral called in for Home Health PT to [facility].Referral faxed also.  Additional Follow-up by: Carlton Adam,  January 06, 2010 11:23 AM

## 2010-12-28 NOTE — Consult Note (Signed)
Summary: [facility]  [facility]   Imported By: Maryln Gottron 07/15/2010 15:28:37  _____________________________________________________________________  External Attachment:    Type:   Image     Comment:   External Document  Appended Document: [facility] checking MRI and NCV studies

## 2010-12-28 NOTE — Letter (Signed)
Summary: CMN & Order for [facility]  [facility] & Order for [facility]   Imported By: Lanelle Bal 02/25/2010 14:13:18  _____________________________________________________________________  External Attachment:    Type:   Image     Comment:   External Document

## 2010-12-28 NOTE — Consult Note (Signed)
Summary: [facility]  [facility]   Imported By: Sherian Rein 02/18/2010 13:59:04  _____________________________________________________________________  External Attachment:    Type:   Image     Comment:   External Document  Appended Document: [facility] RA--considering MTX Knee aspirated

## 2010-12-28 NOTE — Progress Notes (Signed)
Summary: Triage   Phone Note Call from Patient Call back at [phone]   Caller: Patient Call For: Dr. Russella Dar Reason for Call: Talk to Nurse Summary of Call: pt. is constipated and taking Miralax and Dulcolax and wants to know if he can take Magnesium in addition? Initial call taken by: Karna Christmas,  June 15, 2010 4:16 PM  Follow-up for Phone Call        Use Miralax three times a day, decrease for diarrhea Ducolax tablets, 2 by mouth once daily as needed Mag Citrate 1 bottle twice a week if all above is not adequate Further management per PCP  the above was a order Dr Russella Dar gave on 06/09/10 during a riage call about the same.  I have left the patient a message with Dr Ardell Isaacs orders.  I have asked him to call me back if he has any questions. Follow-up by: Darcey Nora RN, CGRN,  June 16, 2010 8:51 AM

## 2010-12-28 NOTE — Assessment & Plan Note (Signed)
Summary: xolair//jrc   Nurse Visit   Allergies: 1)  ! Cipro 2)  ! Altace 3)  ! Zocor 4)  ! Celexa 5)  ! Ativan 6)  ! Paxil  Medication Administration  Injection # 1:    Medication: Xolair (omalizumab) 150mg     Diagnosis: EXTRINSIC ASTHMA, UNSPECIFIED (ICD-493.00)    Route: IM    Site: R deltoid    Exp Date: 08/28/2013    Lot #: [id number]    Mfr: Salome Spotted    Comments: Xolair 375 mg, 90 units, 1.0 ml x 2 in Right deltoid, 1.0 ml x 1 in left deltoid. pt waited 20 mins.     Given by: Drucie Opitz, CMA  Orders Added: 1)  Xolair (omalizumab) 150mg  [J2357] 2)  Administration xolair injection [phone]

## 2010-12-28 NOTE — Miscellaneous (Signed)
Summary: Controlled Substances Contract / [facility]  Controlled Substances Contract / [facility]   Imported By: Lennie Odor 05/07/2010 16:19:01  _____________________________________________________________________  External Attachment:    Type:   Image     Comment:   External Document

## 2010-12-28 NOTE — Progress Notes (Signed)
Summary: surgery clearance/ fax request---  Phone Note Call from Patient   Caller: [name] w/ dr cope's office Call For: sood Summary of Call: hasn't received letter re: surgery clearance (although she understands that this was mailed yesterday). wants a letter faxed asap to attn [name]- fax # [phone]. contact # is [phone] Initial call taken by: Tivis Ringer, CNA,  March 09, 2010 12:29 PM  Follow-up for Phone Call        VS sent letter yesterday 03-08-2010.  Now, Dr. Wynn Maudlin office is wanting clearance letter faxed today asap.  I'm unsure if this letter Dr. Craige Cotta signed was mailed to Dr. Wynn Maudlin office or faxed.  LM with medical records/scan people to check their paperwork to see if they have a surgical clearance letter on pt waiting to be scanned into computer.  Aundra Millet Reynolds LPN  March 09, 2010 1:26 PM   spoke with medical records and they have no paperwork downstairs waiting to be scanned regarding this pt and surgical clearance.  VS, please advise.  Dr. Wynn Maudlin office is wanting the surgical clearance faxed to them today asap.  Arman Filter LPN  March 09, 2010 2:34 PM   Additional Follow-up for Phone Call Additional follow up Details #1::        Letter written and signed by Dr. Craige Cotta. I have faxed the letter to number provided.Michel Bickers [facility]  March 09, 2010 3:47 PM    Additional Follow-up for Phone Call Additional follow up Details #2::    Note in [facility] faxed to Dr. Wynn Maudlin office. Follow-up by: Coralyn Helling MD,  March 09, 2010 4:47 PM

## 2010-12-28 NOTE — Letter (Signed)
Summary: [facility]  [facility]   Imported By: Lanelle Bal 05/03/2010 08:43:06  _____________________________________________________________________  External Attachment:    Type:   Image     Comment:   External Document  Appended Document: [facility] wonders about underlying bladder dysfunction no retention though

## 2010-12-28 NOTE — Procedures (Signed)
Summary: Colonoscopy   Colonoscopy  Procedure date:  09/29/2006  Findings:      Results: Diverticulosis.       Location:  [facility].    Procedures Next Due Date:    Colonoscopy: 09/2011 Patient Name: [name], [name] MRN:  Procedure Procedures: Colonoscopy CPT: 25366.  Personnel: Endoscopist: Venita Lick. Russella Dar, MD, Clementeen Graham.  Referred By: Tillman Abide, MD.  Exam Location: Exam performed in Outpatient Clinic. Outpatient  Patient Consent: Procedure, Alternatives, Risks and Benefits discussed, consent obtained, from patient. Consent was obtained by the RN.  Indications Symptoms: Abdominal pain / bloating.  Surveillance of: Adenomatous Polyp(s). Initial polypectomy was performed in 1990.  History  Current Medications: Patient is not currently taking Coumadin.  Pre-Exam Physical: Performed Sep 29, 2006. Cardio-pulmonary exam, Rectal exam, HEENT exam , Abdominal exam, Mental status exam WNL.  Comments: Pt. history reviewed/updated, physical exam performed prior to initiation of sedation?Yes Exam Exam: Extent of exam reached: Cecum, extent intended: Cecum.  The cecum was identified by appendiceal orifice and IC valve. Time to Cecum: 00:03: 59. Time for Withdrawl: 00:09:52. Colon retroflexion performed. ASA Classification: II. Tolerance: excellent.  Monitoring: Pulse and BP monitoring, Oximetry used. Supplemental O2 given.  Colon Prep Used MoviPrep for colon prep. Prep results: good.  Sedation Meds: Patient assessed and found to be appropriate for moderate (conscious) sedation. Fentanyl 75 mcg. given IV. Versed 8 mg. given IV.  Findings NORMAL EXAM: Cecum to Descending Colon.  - DIVERTICULOSIS: Sigmoid Colon. Not bleeding. ICD9: Diverticulosis: 562.10.  OTHER FINDING: Telangiectasias found in Rectum. Comments: Probable mild rectal radiation proctopathy.   Assessment  Diagnoses: 562.10: Diverticulosis.   Events  Unplanned Interventions: No  intervention was required.  Unplanned Events: There were no complications. Plans  Post Exam Instructions: Post sedation instructions given.  Medication Plan: Antispasmodics: Hyoscyamine SL 0.125 mg QID prn,   Patient Education: Patient given standard instructions for: Diverticulosis. IBS.  Comments: DC Robinul d/t side effects Disposition: After procedure patient sent to recovery. After recovery patient sent home.  Scheduling/Referral: Colonoscopy, to [facility] T. Russella Dar, MD, [facility], around Sep 30, 2011.  Office Visit, to Tillman Abide, MD, consider starting an SSRI for functional abd pain, anxiety and evaluate lumbar degenerative disk disease, around Oct 13, 2006.    This report was created from the original endoscopy report, which was reviewed and signed by the above listed endoscopist.    cc: Tillman Abide, MD

## 2010-12-28 NOTE — Letter (Signed)
Summary: Dr.Brian Cope,[facility],Note  Dr.Brian Cope,[facility],Note   Imported By: Beau Fanny 04/16/2010 10:26:46  _____________________________________________________________________  External Attachment:    Type:   Image     Comment:   External Document  Appended Document: Dr.Brian Cope,[facility],Note voiding okay with no postvoid residual with Foley out after bladder neck contracture repaired on rapaflo

## 2010-12-28 NOTE — Letter (Signed)
Summary: [facility]  [facility]   Imported By: Lanelle Bal 09/06/2010 10:23:37  _____________________________________________________________________  External Attachment:    Type:   Image     Comment:   External Document  Appended Document: [facility] trying self intermittent catheterization

## 2010-12-28 NOTE — Letter (Signed)
Summary: Pulmonary Clearance/[facility]  Pulmonary Clearance/[facility]   Imported By: Sherian Rein 03/18/2010 09:28:26  _____________________________________________________________________  External Attachment:    Type:   Image     Comment:   External Document

## 2010-12-28 NOTE — Assessment & Plan Note (Signed)
Summary: ongoing bowel problems--ch.    History of Present Illness Visit Type: Initial Consult Primary GI MD: Elie Goody MD [facility] Primary Provider: Tillman Abide, MD Chief Complaint: Very painful bowel movements & chronic constipation History of Present Illness:   [name] returns today with his wife, complaining of worsening problems with constipation and left-sided abdominal pain. He has difficulty evacuating bowel movements for about 2 months and he has had left-sided abdominal pain with and without bowel movements. The symptoms have improved with the regular use of MiraLax and Senokot.  He has also had a weight loss over the past year associated with a decrease in his overall health status. He has a prior history of adenomatous colon polyps and last underwent colonoscopy in November 2007, which showed only diverticulosis.  He states that since he decreased prednisone his rheumatoid arthritis has been much worse and he had difficulty with ambulation and gets much less physical activity.    GI Review of Systems    Reports abdominal pain, bloating, loss of appetite, and  weight loss.     Location of  Abdominal pain: left side. Weight loss of 55 pounds over 1 year.   Denies acid reflux, belching, chest pain, dysphagia with liquids, dysphagia with solids, heartburn, nausea, vomiting, vomiting blood, and  weight gain.      Reports change in bowel habits, constipation, irritable bowel syndrome, and  rectal pain.     Denies anal fissure, black tarry stools, diarrhea, diverticulosis, fecal incontinence, heme positive stool, hemorrhoids, jaundice, light color stool, liver problems, and  rectal bleeding.    Current Medications (verified): 1)  Singulair 10 Mg Tabs (Montelukast Sodium) .Marland Kitchen.. 1 At Bedtime 2)  Albuterol Sulfate (2.5 Mg/6ml) 0.083% Nebu (Albuterol Sulfate) .... One Vial Nebulized Up To Four Times Per Day As Needed 3)  Ventolin Hfa 108 (90 Base) Mcg/act Aers (Albuterol Sulfate)  .... Two Puffs Up To Four Times Per Day As Needed 4)  Methotrexate 2.5 Mg Tabs (Methotrexate Sodium) .... 8 Tablets Weekly For Rheumatoid Arthritis 5)  Prednisone 5 Mg Tabs (Prednisone) .... Take 1 1/2 By Mouth Once Daily 6)  Toprol Xl 100 Mg Xr24h-Tab (Metoprolol Succinate) .... Take 1/2  Tablet By Mouth Once A Day 7)  Temazepam 15 Mg  Caps (Temazepam) .... Take 1or 2 At Bedtime As Needed To Help Sleep 8)  Epipen 2-Pak 0.3 Mg/0.87ml Devi (Epinephrine) .... Use As Directed As Needed For Severe Allergic Reaction 9)  Alprazolam 0.5 Mg Tabs (Alprazolam) .Marland Kitchen.. 1-2 Tabs By Mouth Three Times A Day As Needed For Nerves 10)  Adult Aspirin Ec Low Strength 81 Mg  Tbec (Aspirin) .... Take 1 Tablet By Mouth Once A Day 11)  Multivitamins   Tabs (Multiple Vitamin) .... Take 1 Tablet By Mouth Once A Day 12)  Senokot S 8.6-50 Mg Tabs (Sennosides-Docusate Sodium) .... One Tablet By Mouth Two Times A Day 13)  Miralax  Powd (Polyethylene Glycol 3350) .Marland KitchenMarland KitchenMarland Kitchen 17 Grams in 8 Oz of Water 1-2 X Daily  Allergies (verified): 1)  ! Cipro 2)  ! Altace 3)  ! Zocor 4)  ! Celexa 5)  ! Ativan 6)  ! Paxil  Past History:  Past Medical History: Reviewed history from 05/04/2010 and no changes required. CAD Asthma      - PFT 04/07/10 FEV1 2.61(86%), FVC 3.60(78%), FEV1% 73, TLC 7.26(105%), DLCO 112%, no BD      - RAST and IgE check on 07/23/09.  His IgE was 527.7. Chronic sinusitis Diverticulosis GERD  Rheumatoid arthritis Prostate Cancer  Irritable bowel syndrome Anxiety Neurodermatitis Erectile dysfunction Obstructive sleep apnea      - BPAP 14/10 (12/09/09) Allergic rhinitis Hypertension Adenomatous Colon Polyps 1990  Past Surgical History: Reviewed history from 03/26/2010 and no changes required. Radical prostatectomy Left knee arthroscopy Sinus surgery 2009 (deviated septum/polyps) Urethral stricture repair  4/11  Dr Achilles Dunk  Family History: Reviewed history from 05/04/2009 and no changes required. Son -  asthma, allergies Mother-deceased at age 45, heart failure Father-deceased at age 43 pneumonia 4 sisters healthy  Social History: Reviewed history from 05/12/2008 and no changes required. 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 Never Smoked Alcohol use-occasional Regular exercise-no  Review of Systems       The patient complains of anxiety-new, arthritis/joint pain, back pain, fatigue, itching, muscle pains/cramps, swelling of feet/legs, and urination - excessive.  The patient denies allergy/sinus, anemia, blood in urine, breast changes/lumps, change in vision, confusion, cough, coughing up blood, depression-new, fainting, fever, headaches-new, hearing problems, heart murmur, heart rhythm changes, menstrual pain, night sweats, nosebleeds, pregnancy symptoms, shortness of breath, skin rash, sleeping problems, sore throat, swollen lymph glands, thirst - excessive , urination - excessive , urination changes/pain, urine leakage, vision changes, and voice change.    Vital Signs:  Patient profile:   77 year old male Height:      71 inches Weight:      191.25 pounds BMI:     26.77 Pulse rate:   68 / minute Pulse rhythm:   regular BP sitting:   110 / 78  (right arm) Cuff size:   regular  Vitals Entered By: June McMurray CMA [name]) (May 05, 2010 9:46 AM)  Physical Exam  General:  Well developed, well nourished, no acute distress. Head:  Normocephalic and atraumatic. Eyes:  PERRLA, no icterus. Mouth:  No deformity or lesions, dentition normal. Lungs:  Clear throughout to auscultation. Heart:  Regular rate and rhythm; no murmurs, rubs,  or bruits. Abdomen:  Soft, nontender and nondistended. No masses, hepatosplenomegaly or hernias noted. Normal bowel sounds. Rectal:  deferred until time of colonoscopy.   Msk:  difficulty with ambulation, uses a walker. Neurologic:  Alert and  oriented x4;  grossly normal neurologically. Psych:  anxious.    Impression &  Recommendations:  Problem # 1:  CONSTIPATION (ICD-564.00) Worsening constipation associated with a decline in mobility. Irritable bowel syndrome is quite active and he should resume anti-spasmodic regular basis. Begin Citrucel once or twice daily. He will titrate Citrucel to needs. Decrease and discontinue Senokot if possible. Titrate MiraLax between 1 and 3 times daily for adequate bowel movements. The risks, benefits and alternatives to colonoscopy with possible biopsy and possible polypectomy were discussed with the patient and they consent to proceed. The procedure will be scheduled electively. Orders: Colonoscopy (Colon)  Problem # 2:  PERSONAL HISTORY OF COLONIC POLYPS (ICD-V12.72) Adenomatous colon polyps, initially diagnosed in 1990. Surveillance colonoscopy as above. Orders: Colonoscopy (Colon)  Problem # 3:  IBS (ICD-564.1) See problem #1.  Problem # 4:  LOSS OF WEIGHT (ICD-783.21) Unexplained weight loss. Consider further evaluation with a CT scan of the abdomen.  Problem # 5:  ANXIETY (ICD-300.00)  Patient Instructions: 1)  Start Metamucil once daily. 2)  High Fiber, Low Fat  Healthy Eating Plan brochure given. 3)  Reduce Senakot and stay on same dose of the Miralax.  4)   Colonoscopy brochure given.  5)  Copy sent to : Tillman Abide, MD 6)  The medication list was reviewed and  reconciled.  All changed / newly prescribed medications were explained.  A complete medication list was provided to the patient / caregiver.  Prescriptions: LEVSIN/SL 0.125 MG SUBL (HYOSCYAMINE SULFATE) 1-2 tablets by mouth under tongue or by mouth four times a day as needed  #100 x 11   Entered by:   Christie Nottingham CMA (AAMA)   Authorized by:   Meryl Dare MD [facility]   Signed by:   Meryl Dare MD FACG on 05/05/2010   Method used:   Electronically to        [facility] [address] (retail)       [address], [address]       [place], [place]  [address]        Ph: [phone]       Fax: [phone]   RxID:   [id number] MOVIPREP 100 GM  SOLR (PEG-KCL-NACL-NASULF-NA ASC-C) As per prep instructions.  #1 x 0   Entered by:   Christie Nottingham CMA (AAMA)   Authorized by:   Meryl Dare MD [facility]   Signed by:   Meryl Dare MD FACG on 05/05/2010   Method used:   Electronically to        [facility] [address] (retail)       [address]       [place], [place]  [address]       Ph: [phone]       Fax: [phone]   RxID:   [id number]

## 2010-12-28 NOTE — Letter (Signed)
Summary: [facility]   Imported By: Sherian Rein 04/22/2010 14:09:53  _____________________________________________________________________  External Attachment:    Type:   Image     Comment:   External Document

## 2010-12-28 NOTE — Assessment & Plan Note (Signed)
Summary: 6-8 weeks/apc   Copy to:  Dr. Andee Poles, Dr. Saverio Danker Primary Provider/Referring Provider:  Tillman Abide  CC:  The patient is here for follow-up and states his breathing has improved. He was diagnosed 2 weeks ago with Rheumatoid Arthritis.Marland Kitchen  History of Present Illness: 77 year old male with known history of  asthma, OSA on BPAP, and rhinits.  His breathing has been doing well, even with the decrease in his prednisone dose.  He is not having much cough, wheeze, sputum, or chest congestion.  His sinuses have been okay.  He has been sleeping better since he had his BPAP pressure adjusted.  He is not having any trouble with his mask.  He was seen by Dr. Kern Reap with Rheumatology, and diagnosis of rheumatoid arthritis was confirmed.  He is being evaluated as to whether he could tolerate methotrexate for his arthritis. Really his main difficulties at present seem related to his RA.    Current Medications (verified): 1)  Symbicort 160-4.5 Mcg/act Aero (Budesonide-Formoterol Fumarate) .... Use 2 Puffs Two Times A Day As Needed 2)  Spiriva Handihaler 18 Mcg Caps (Tiotropium Bromide Monohydrate) .Marland Kitchen.. 1 Capsule in Inhaler Daily 3)  Singulair 10 Mg Tabs (Montelukast Sodium) .Marland Kitchen.. 1 At Bedtime 4)  Albuterol Sulfate (2.5 Mg/5ml) 0.083% Nebu (Albuterol Sulfate) .... One Vial Nebulized Up To Four Times Per Day As Needed 5)  Ventolin Hfa 108 (90 Base) Mcg/act Aers (Albuterol Sulfate) .... Two Puffs Up To Four Times Per Day As Needed 6)  Prednisone 5 Mg Tabs (Prednisone) .... Use As Directed 7)  Toprol Xl 100 Mg Xr24h-Tab (Metoprolol Succinate) .... Take 1 Tablet By Mouth Once A Day 8)  Adult Aspirin Ec Low Strength 81 Mg  Tbec (Aspirin) .... Take 1 Tablet By Mouth Once A Day 9)  Pravachol 20 Mg  Tabs (Pravastatin Sodium) .... Take 1 Tablet By Mouth Once A Day 10)  Temazepam 15 Mg  Caps (Temazepam) .... Take 1or 2 At Bedtime As Needed To Help Sleep 11)  Multivitamins   Tabs (Multiple  Vitamin) .... Take 1 Tablet By Mouth Once A Day 12)  Cozaar 50 Mg Tabs (Losartan Potassium) .... Take 1 Tablet By Mouth Once A Day 13)  Furosemide 40 Mg Tabs (Furosemide) .Marland Kitchen.. 1 Daily As Needed For Swelling in Feet and Legs 14)  Epipen 2-Pak 0.3 Mg/0.42ml Devi (Epinephrine) .... Use As Directed As Needed For Severe Allergic Reaction 15)  Alprazolam 0.5 Mg Tabs (Alprazolam) .Marland Kitchen.. 1-2 Tabs By Mouth Three Times A Day As Needed For Nerves 16)  Plaquenil 200 Mg Tabs (Hydroxychloroquine Sulfate) .Marland Kitchen.. 1 By Mouth Two Times A Day  Allergies (verified): 1)  ! Cipro 2)  ! Altace 3)  ! Zocor 4)  ! Celexa 5)  ! Ativan 6)  ! Paxil  Past History:  Past Medical History: CAD Asthma      - PFT 06/05/08 FEV1 2.84(93%), FVC 3.99(87%), FEV1% 71, TLC 6.78(97%), DLCO 105%, no BD      - RAST and IgE check on 07/23/09.  His IgE was 527.7. Chronic sinusitis Diverticulosis GERD Rheumatoid arthritis Prostate Cancer  Irritable bowel syndrome Anxiety Neurodermatitis Erectile dysfunction Obstructive sleep apnea      - BPAP 14/10 (12/09/09) Allergic rhinitis Hypertension  Past Surgical History: Reviewed history from 05/04/2009 and no changes required. Radical prostatectomy Left knee arthroscopy Sinus surgery 2009 (deviated septum/polyps)  Vital Signs:  Patient profile:   77 year old male Height:      71 inches (180.34 cm) Weight:  215.13 pounds (97.79 kg) BMI:     30.11 O2 Sat:      97 % on Room air Temp:     97.4 degrees F (36.33 degrees C) oral Pulse rate:   82 / minute BP sitting:   132 / 68  (right arm) Cuff size:   large  Vitals Entered By: Michel Bickers CMA (February 15, 2010 3:53 PM)  O2 Sat at Rest %:  97 O2 Flow:  Room air  Physical Exam  General:  obese Nose:  no nasal discharge, no sinus tenderness Mouth:  no oral lesion Neck:  no JVD.   Lungs:  no wheezing or rales Heart:  regular rhythm, normal rate, and no murmurs.   Extremities:  mild swollen joints in hands, minimal  ankle edema Cervical Nodes:  no significant adenopathy   Impression & Recommendations:  Problem # 1:  ASTHMA (ICD-493.90) He has been doing much better since being started on xolair.  He is to continue this.  I have advised him to use his albuterol only on as a needed basis.  He is to continue his other inhaler regimen with singulair.  I do not think he needs chronic prednisone at this time on the basis of his asthma, and would prefer to wean this off.  Will defer adjustment of his prednisone to rheumatology since he is getting benefit for his arthritis from prednisone at this time.  Problem # 2:  ALLERGIC RHINITIS (ICD-477.9) This is stable.  He is to continue on his current sinus regimen.  Problem # 3:  OBSTRUCTIVE SLEEP APNEA (ICD-327.23) He is to continue on his current BPAP set up.  Problem # 4:  ANXIETY (ICD-300.00) He is to continue on as needed xanax.  This seems to help prevent dynamic hyperinflation which can lead to worsening asthma.  Problem # 5:  RHEUMATOID ARTHRITIS (ICD-714.0) His main complaints at present seem more related to his arthritis.  I will defer to rheumatology about whether he can have his prednisone weaned off.  I don't think he needs to have prednisone for his pulmonary disease.  In addition I don't think there is any pulmonary contra-indication at this time for the use of methotrexate to control his arthritis.  I have advised him that he will need periodic pulmonary function testing to monitor his lung function while on methotrexate.  Will arrange for pulmonary function testing now to establish a baseline.  Medications Added to Medication List This Visit: 1)  Plaquenil 200 Mg Tabs (Hydroxychloroquine sulfate) .Marland Kitchen.. 1 by mouth two times a day  Complete Medication List: 1)  Symbicort 160-4.5 Mcg/act Aero (Budesonide-formoterol fumarate) .... Use 2 puffs two times a day as needed 2)  Spiriva Handihaler 18 Mcg Caps (Tiotropium bromide monohydrate) .Marland Kitchen.. 1 capsule in  inhaler daily 3)  Singulair 10 Mg Tabs (Montelukast sodium) .Marland Kitchen.. 1 at bedtime 4)  Albuterol Sulfate (2.5 Mg/40ml) 0.083% Nebu (Albuterol sulfate) .... One vial nebulized up to four times per day as needed 5)  Ventolin Hfa 108 (90 Base) Mcg/act Aers (Albuterol sulfate) .... Two puffs up to four times per day as needed 6)  Prednisone 5 Mg Tabs (Prednisone) .... Use as directed 7)  Toprol Xl 100 Mg Xr24h-tab (Metoprolol succinate) .... Take 1 tablet by mouth once a day 8)  Adult Aspirin Ec Low Strength 81 Mg Tbec (Aspirin) .... Take 1 tablet by mouth once a day 9)  Pravachol 20 Mg Tabs (Pravastatin sodium) .... Take 1 tablet by mouth once a day  10)  Temazepam 15 Mg Caps (Temazepam) .... Take 1or 2 at bedtime as needed to help sleep 11)  Multivitamins Tabs (Multiple vitamin) .... Take 1 tablet by mouth once a day 12)  Cozaar 50 Mg Tabs (Losartan potassium) .... Take 1 tablet by mouth once a day 13)  Furosemide 40 Mg Tabs (Furosemide) .Marland Kitchen.. 1 daily as needed for swelling in feet and legs 14)  Epipen 2-pak 0.3 Mg/0.2ml Devi (Epinephrine) .... Use as directed as needed for severe allergic reaction 15)  Alprazolam 0.5 Mg Tabs (Alprazolam) .Marland Kitchen.. 1-2 tabs by mouth three times a day as needed for nerves 16)  Plaquenil 200 Mg Tabs (Hydroxychloroquine sulfate) .Marland Kitchen.. 1 by mouth two times a day  Other Orders: Est. Patient Level III ([address]08) Full Pulmonary Function Test (PFT)  Patient Instructions: 1)  Continue spiriva, symbicort, and singulair 2)  Use ventolin and albuterol nebulizer as needed 3)  Will schedule breathing test (PFT) 4)  Follow up in 6 weeks

## 2010-12-28 NOTE — Progress Notes (Signed)
Summary: surgery clearance  Phone Note From Other Clinic   Caller: tammy w/ dr cope's office Call For: sood Summary of Call: please call tammy at dr cope's office re: surgery clearance for pt. [phone] Initial call taken by: Tivis Ringer, CNA,  March 08, 2010 2:09 PM  Follow-up for Phone Call        looks like Dr. Copes office called on 03/05/10 about clearance and VS said he knows pt very well and give give clearance as long as there has been no change in pulmonary issues. Lawson Fiscal then spoke to the pt.  see below  "The patient states he has good days and bad days with his breathing. He is having to take neb txs 2-4 times daily. He denies any cough aor wheezing. He says he does not want general anesthesia and would prefer the spinal. I will forward this to Dr. Craige Cotta." Signed by Michel Bickers CMA on 03/08/2010 at 8:48 AM  Please advise if ok to give clearance. Carron Curie CMA  March 08, 2010 2:44 PM   Additional Follow-up for Phone Call Additional follow up Details #1::        Wiil send letter later today. Additional Follow-up by: Coralyn Helling MD,  March 08, 2010 2:52 PM     Appended Document: surgery clearance called, spoke with Tammy.  Informed her a letter will be sent today per VS.  She verbalized understanding and had no further questions

## 2010-12-28 NOTE — Letter (Signed)
Summary: [facility]  [facility]   Imported By: Lanelle Bal 08/09/2010 11:35:49  _____________________________________________________________________  External Attachment:    Type:   Image     Comment:   External Document  Appended Document: [facility] left knee injected trying to slowly wean off prednisone

## 2010-12-28 NOTE — Progress Notes (Signed)
Summary: TALK TO DR SOOD re: surgery  Phone Note Call from Patient Call back at [phone]   Caller: Patient Call For: sood Summary of Call: pt want to talk to Dr [name] SURGERY AND ANESTHESIA Initial call taken by: Rickard Patience,  March 15, 2010 11:23 AM  Follow-up for Phone Call        The patient wanted to make sure Dr. Craige Cotta had sent letter to Dr. Achilles Dunk regarding his surgery. I assured him that this had been taken care of and he was fine with this. Follow-up by: Michel Bickers CMA,  March 15, 2010 2:15 PM

## 2010-12-28 NOTE — Progress Notes (Signed)
  Phone Note Other Incoming   Caller: Pt. came in today for xolair shot. Details for Reason: Pt.had a headache for about 5 mins.. Summary of Call: While [name] was waiting his usual 30 mins. he got a headache it only last 5 mins.. He was fine when he left, he went to make his 2 wk. appt.. He said he has an appt. with you Monday. Just thought you'd want to know. Initial call taken by: Dimas Millin,  December 03, 2009 4:21 PM  Follow-up for Phone Call        Noted. Follow-up by: Coralyn Helling MD,  December 04, 2009 11:54 AM

## 2010-12-28 NOTE — Miscellaneous (Signed)
Summary: [facility] / NOTIFICATION OF ADMISSION & INITIAL PLAN  [facility] / NOTIFICATION OF ADMISSION & INITIAL PLAN OF CARE RECOMMENDATIONS   Imported By: Carin Primrose 01/12/2010 14:23:31  _____________________________________________________________________  External Attachment:    Type:   Image     Comment:   External Document

## 2010-12-28 NOTE — Progress Notes (Signed)
Summary: pulmonary clearance  Phone Note From Other Clinic Call back at [phone]   Caller: Tammy/Dr. Assunta Gambles Call For: sood Summary of Call: Pt needs pulmonary clearance for surgery. Initial call taken by: Darletta Moll,  March 05, 2010 11:14 AM  Follow-up for Phone Call        called Dr. Wynn Maudlin office, spoke with Tammy.  Per Tammy, pt is scheduled to have a visual internal urethrotomy on April 19 and they need pulmonary clearance.  Per Tammy, Dr. Achilles Dunk is ok with doing a spinal instead of general anesthisia if pt cannot tolerate general anestisia for pulmonary standpoint.  Informed Tammy we usually have pt's come in for ov for surgery clearance however VS is booked next wk.  Informed her will call her back once speak to his nurse.  Tammy also states pt has a procedure in office yesterday and she will fax over report once it is dictated.  Lawson Fiscal, pt was last seen on 02/15/10 by VS - pls advise if we can work pt in before scheduled procedure.  Thanks! Follow-up by: Gweneth Dimitri RN,  March 05, 2010 12:03 PM  Additional Follow-up for Phone Call Additional follow up Details #1::        Dr. Craige Cotta you saw this patient back on 02/15/2010. Do you want to see him again before giving pulmonary clearance? Please advise.Michel Bickers [facility]  March 05, 2010 4:42 PM    Additional Follow-up for Phone Call Additional follow up Details #2::    I know Mr. Walmer very well.  I can write a note detailing his pulmonary risk for the procedure without needing to see him unless he has had a change in his pulmonary status since his last visit.  Can you verify that his pulmonary status has been stable since his last visit, and if so advise him that I will send a note to his surgeon regarding his pulmonary risk prior to surgery. Follow-up by: Coralyn Helling MD,  March 05, 2010 8:50 PM   Appended Document: pulmonary clearance The patient states he has good days and bad days with his breathing. He is having to take neb txs 2-4  times daily. He denies any cough aor wheezing. He says he does not want general anesthesia and would prefer the spinal. I will forward this to Dr. Craige Cotta.

## 2010-12-28 NOTE — Progress Notes (Signed)
Summary: TEMAZEPAM  Phone Note Refill Request Message from:  [id number] on March 18, 2010 2:50 PM  Refills Requested: Medication #1:  TEMAZEPAM 15 MG  CAPS Take 1or 2 at bedtime as needed to help sleep   Last Refilled: 02/13/2010 [facility] Request    Method Requested: Telephone to Pharmacy Initial call taken by: Mervin Hack CMA [name]),  March 18, 2010 2:50 PM  Follow-up for Phone Call        okay #60 x 5 Follow-up by: Cindee Salt MD,  March 18, 2010 3:37 PM  Additional Follow-up for Phone Call Additional follow up Details #1::        Rx called to pharmacy Additional Follow-up by: DeShannon Katrinka Blazing CMA [name]),  March 18, 2010 3:42 PM    Prescriptions: TEMAZEPAM 15 MG  CAPS (TEMAZEPAM) Take 1or 2 at bedtime as needed to help sleep  #60 x 5   Entered by:   Mervin Hack CMA (AAMA)   Authorized by:   Cindee Salt MD   Signed by:   Mervin Hack CMA (AAMA) on 03/18/2010   Method used:   Telephoned to ...       [facility] [address] (retail)       [address]       [place], [place]  [address]       Ph: [phone]       Fax: [phone]   RxID:   [id number]

## 2010-12-28 NOTE — Procedures (Signed)
Summary: Colonoscopy   Colonoscopy  Procedure date:  10/28/2003  Findings:      Results: Diverticulosis.       Location:  [facility].    Procedures Next Due Date:    Colonoscopy: 10/2008 Patient Name: [name], [name] MRN:  Procedure Procedures: Colonoscopy CPT: 16109.  Personnel: Endoscopist: Venita Lick. Russella Dar, MD, Clementeen Graham.  Referred By: Tillman Abide, MD.  Exam Location: Exam performed in Outpatient Clinic. Outpatient  Patient Consent: Procedure, Alternatives, Risks and Benefits discussed, consent obtained, from patient. Consent was obtained by the RN.  Indications Symptoms: Constipation Abdominal pain / bloating.  Surveillance of: Adenomatous Polyp(s). Initial polypectomy was performed in 1990.  History  Current Medications: Patient is not currently taking Coumadin.  Pre-Exam Physical: Performed Oct 28, 2003. Entire physical exam was normal.  Exam Exam: Extent of exam reached: Cecum, extent intended: Cecum.  The cecum was identified by appendiceal orifice and IC valve. Colon retroflexion performed. ASA Classification: II. Tolerance: good.  Monitoring: Pulse and BP monitoring, Oximetry used. Supplemental O2 given.  Colon Prep Used Golytely for colon prep. Prep results: good.  Sedation Meds: Patient assessed and found to be appropriate for moderate (conscious) sedation. Fentanyl 100 mcg. given IV. Versed 10 mg. given IV.  Findings - DIVERTICULOSIS: Sigmoid Colon. Not bleeding. ICD9: Diverticulosis: 562.10.  NORMAL EXAM: Cecum to Descending Colon.  NORMAL EXAM: Rectum.   Assessment  Diagnoses: 562.10: Diverticulosis.   Events  Unplanned Interventions: No intervention was required.  Unplanned Events: There were no complications. Plans Medication Plan: Continue current medications.  Patient Education: Patient given standard instructions for: Diverticulosis. Constipation. Disposition: After procedure patient sent to recovery. After  recovery patient sent home.  Scheduling/Referral: Colonoscopy, to [facility] T. Russella Dar, MD, [facility], around Oct 27, 2008.    This report was created from the original endoscopy report, which was reviewed and signed by the above listed endoscopist.    cc: Tillman Abide, MD

## 2010-12-28 NOTE — Assessment & Plan Note (Signed)
Summary: 2 MONTH FOLLOW UP/[facility]   Vital Signs:  Patient profile:   77 year old male Weight:      204 pounds O2 Sat:      98 % on Room air Temp:     98 degrees F oral Pulse rate:   90 / minute Pulse rhythm:   regular BP sitting:   112 / 60  (left arm) Cuff size:   large  Vitals Entered By: Mervin Hack CMA [name]) (March 01, 2010 4:18 PM)  O2 Flow:  Room air CC: 2 month follow-up   History of Present Illness: Breathing is better asthma fairly quiet since starting xolair  did see Dr Achilles Dunk 4/1 Stopped the sulfa due to itching Tested bladder and it emptied fairly normally He believes his symptoms are related to past radiation cystitis Given Rx for levsin---helps quite a bit Plans cystoscopy for later this week  Having some stomach cramping also Moving bowels but straining some Had LMQ abd pain last night---- wonders about IBS  Arthritis continues to be painful due to start MTX this week----Dr Achilles Dunk nixed this idea  "I feel awful" Tired, etc excedrin seems to help some Holding off on all alcohol  Allergies: 1)  ! Cipro 2)  ! Altace 3)  ! Zocor 4)  ! Celexa 5)  ! Ativan 6)  ! Paxil  Past History:  Past medical, surgical, family and social histories (including risk factors) reviewed for relevance to current acute and chronic problems.  Past Medical History: Reviewed history from 02/15/2010 and no changes required. CAD Asthma      - PFT 06/05/08 FEV1 2.84(93%), FVC 3.99(87%), FEV1% 71, TLC 6.78(97%), DLCO 105%, no BD      - RAST and IgE check on 07/23/09.  His IgE was 527.7. Chronic sinusitis Diverticulosis GERD Rheumatoid arthritis Prostate Cancer  Irritable bowel syndrome Anxiety Neurodermatitis Erectile dysfunction Obstructive sleep apnea      - BPAP 14/10 (12/09/09) Allergic rhinitis Hypertension  Past Surgical History: Reviewed history from 05/04/2009 and no changes required. Radical prostatectomy Left knee arthroscopy Sinus surgery 2009  (deviated septum/polyps)  Family History: Reviewed history from 05/04/2009 and no changes required. Son - asthma, allergies Mother-deceased at age 68, heart failure Father-deceased at age 60 pneumonia 4 sisters healthy  Social History: Reviewed history from 05/12/2008 and no changes required. 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 Never Smoked Alcohol use-occasional Regular exercise-no  Review of Systems       still not eating well weight down 3# more sleep is not good--esp since trouble with the bladder. Holding off on CPAP in last few days  Physical Exam  General:  alert.  Uncomfortable but no distress Neck:  supple, no masses, and no thyromegaly.   Lungs:  normal respiratory effort, normal breath sounds, and no wheezes.   Heart:  normal rate, regular rhythm, no murmur, and no gallop.   Abdomen:  soft, no distention, and no masses.  Mild LLQ tenderness Msk:  joint swelling in knees and hands Extremities:  no sig edema Psych:  normally interactive, good eye contact, and dysphoric affect.     Impression & Recommendations:  Problem # 1:  ACUTE CYSTITIS (ICD-595.0) Assessment Comment Only may be related to past bladder radiation damage Dr Achilles Dunk managing  hyoscamine helps some  Problem # 2:  ABDOMINAL PAIN, LEFT LOWER QUADRANT (ICD-789.04) Assessment: New could be diverticular but no  evidence of infection patient thinks it is IBS okay to try the levsin  Problem # 3:  RHEUMATOID ARTHRITIS (ICD-714.0) Assessment: Unchanged active needs to start the MTX---I am not sure about the restriction due to his bladder issues will need to review with Dr Achilles Dunk plaquenil not helpful so will stop  His updated medication list for this problem includes:    Prednisone 5 Mg Tabs (Prednisone) .Marland Kitchen... Take 2 by mouth once daily    Adult Aspirin Ec Low Strength 81 Mg Tbec (Aspirin) .Marland Kitchen... Take 1 tablet by mouth once a day  Problem # 4:  ASTHMA (ICD-493.90) Assessment:  Improved at least this is better on xolair  His updated medication list for this problem includes:    Symbicort 160-4.5 Mcg/act Aero (Budesonide-formoterol fumarate) ..... Use 2 puffs two times a day as needed    Spiriva Handihaler 18 Mcg Caps (Tiotropium bromide monohydrate) .Marland Kitchen... 1 capsule in inhaler daily    Singulair 10 Mg Tabs (Montelukast sodium) .Marland Kitchen... 1 at bedtime    Albuterol Sulfate (2.5 Mg/44ml) 0.083% Nebu (Albuterol sulfate) ..... One vial nebulized up to four times per day as needed    Ventolin Hfa 108 (90 Base) Mcg/act Aers (Albuterol sulfate) .Marland Kitchen..Marland Kitchen Two puffs up to four times per day as needed    Prednisone 5 Mg Tabs (Prednisone) .Marland Kitchen... Take 2 by mouth once daily  Complete Medication List: 1)  Symbicort 160-4.5 Mcg/act Aero (Budesonide-formoterol fumarate) .... Use 2 puffs two times a day as needed 2)  Spiriva Handihaler 18 Mcg Caps (Tiotropium bromide monohydrate) .Marland Kitchen.. 1 capsule in inhaler daily 3)  Singulair 10 Mg Tabs (Montelukast sodium) .Marland Kitchen.. 1 at bedtime 4)  Albuterol Sulfate (2.5 Mg/90ml) 0.083% Nebu (Albuterol sulfate) .... One vial nebulized up to four times per day as needed 5)  Ventolin Hfa 108 (90 Base) Mcg/act Aers (Albuterol sulfate) .... Two puffs up to four times per day as needed 6)  Prednisone 5 Mg Tabs (Prednisone) .... Take 2 by mouth once daily 7)  Toprol Xl 100 Mg Xr24h-tab (Metoprolol succinate) .... Take 1 tablet by mouth once a day 8)  Adult Aspirin Ec Low Strength 81 Mg Tbec (Aspirin) .... Take 1 tablet by mouth once a day 9)  Pravachol 20 Mg Tabs (Pravastatin sodium) .... Take 1 tablet by mouth once a day 10)  Temazepam 15 Mg Caps (Temazepam) .... Take 1or 2 at bedtime as needed to help sleep 11)  Multivitamins Tabs (Multiple vitamin) .... Take 1 tablet by mouth once a day 12)  Cozaar 50 Mg Tabs (Losartan potassium) .... Take 1 tablet by mouth once a day 13)  Furosemide 40 Mg Tabs (Furosemide) .Marland Kitchen.. 1 daily as needed for swelling in feet and legs 14)   Epipen 2-pak 0.3 Mg/0.16ml Devi (Epinephrine) .... Use as directed as needed for severe allergic reaction 15)  Alprazolam 0.5 Mg Tabs (Alprazolam) .Marland Kitchen.. 1-2 tabs by mouth three times a day as needed for nerves 16)  Hyoscyamine Sulfate 0.125 Mg Tabs (Hyoscyamine sulfate) .... Take 1 by mouth 4 times a day as needed for bladder or intestinal spasm  Patient Instructions: 1)  I will call Dr Achilles Dunk to check into using the methotrexate 2)  Please schedule a follow-up appointment in 1 month.   Current Allergies (reviewed today): ! CIPRO ! ALTACE ! ZOCOR ! CELEXA ! ATIVAN ! PAXIL

## 2010-12-28 NOTE — Assessment & Plan Note (Signed)
Summary: rov 4 wks ///kp   Copy to:  Dr. Andee Poles, Dr. Saverio Danker, Dr. Assunta Gambles Primary Provider/Referring Provider:  Tillman Abide, MD  CC:  4 week follow up, breathing is doing good, pt states he barely uses his inhalers bc his breathing is doing that good, pt states his reflux has made him to have a little cough but then goes away, and pt has no concerns today.  History of Present Illness: 77 year old male with known history of  asthma, OSA, and rhinits.  He has been doing quite well with his breathing.  He stopped singulair a few days ago, and has not noticed any problems.  He also had forgotten to use his symbicort, and didn't feel too much trouble.  He has not needed to use his albuterol that much.  His weight has been stable.  His sinuses have been okay.  He is not using his BPAP.  He sleeps okay.  He feels fatigued, but not sleepy, during the day.  He really only gets problems with his breathing now when he gets anxious.  He remains on prednisone 5 mg once daily.    Preventive Screening-Counseling & Management  Alcohol-Tobacco     Smoking Status: never  Caffeine-Diet-Exercise     Does Patient Exercise: no  Current Medications (verified): 1)  Levsin/sl 0.125 Mg Subl (Hyoscyamine Sulfate) .Marland Kitchen.. 1-2 Tablets By Mouth Under Tongue or By Mouth Four Times A Day As Needed 2)  Symbicort 160-4.5 Mcg/act Aero (Budesonide-Formoterol Fumarate) .... 2 Puffs Two Times A Day 3)  Albuterol Sulfate (2.5 Mg/51ml) 0.083% Nebu (Albuterol Sulfate) .... One Vial Nebulized Up To Four Times Per Day As Needed 4)  Ventolin Hfa 108 (90 Base) Mcg/act Aers (Albuterol Sulfate) .... Two Puffs Up To Four Times Per Day As Needed 5)  Methotrexate 2.5 Mg Tabs (Methotrexate Sodium) .... 9 Tablets Weekly For Rheumatoid Arthritis 6)  Prednisone 5 Mg Tabs (Prednisone) .... Take 1  By Mouth Once Daily 7)  Metoprolol Succinate 50 Mg Xr24h-Tab (Metoprolol Succinate) .Marland Kitchen.. 1 Tab Daily For High Blood Pressure 8)   Adult Aspirin Ec Low Strength 81 Mg  Tbec (Aspirin) .... Take 1 Tablet By Mouth Once A Day 9)  Temazepam 15 Mg  Caps (Temazepam) .... Take 1or 2 At Bedtime As Needed To Help Sleep 10)  Alprazolam 0.5 Mg Tabs (Alprazolam) .Marland Kitchen.. 1-2 Tabs By Mouth Three Times A Day As Needed For Nerves 11)  Multivitamins   Tabs (Multiple Vitamin) .... Take 1 Tablet By Mouth Once A Day 12)  Miralax  Powd (Polyethylene Glycol 3350) .Marland KitchenMarland KitchenMarland Kitchen 17 Grams in 8 Oz of Water 1-2 X Daily 13)  Epipen 2-Pak 0.3 Mg/0.7ml Devi (Epinephrine) .... Use As Directed As Needed For Severe Allergic Reaction 14)  Omeprazole 20 Mg Tbec (Omeprazole) .... Take 1 By Mouth Once Daily 15)  Mirtazapine 15 Mg Tabs (Mirtazapine) .Marland Kitchen.. 1 Tab At Bedtime For Depression and Poor Eating 16)  Dulcolax Stool Softener 100 Mg Caps (Docusate Sodium) .... 2 By Mouth Two Times A Day 17)  Pravastatin Sodium 20 Mg Tabs (Pravastatin Sodium) .Marland Kitchen.. 1 Tab Daily For High Cholesterol 18)  Hydroxychloroquine Sulfate 200 Mg Tabs (Hydroxychloroquine Sulfate) .Marland Kitchen.. 1 Tab By Mouth Two Times A Day For Rheumatoid Arthritis  Allergies (verified): 1)  ! Cipro 2)  ! Altace 3)  ! Zocor 4)  ! Celexa 5)  ! Ativan 6)  ! Paxil  Past History:  Past Medical History: Reviewed history from 06/14/2010 and no changes required. CAD  Asthma      - PFT 04/07/10 FEV1 2.61(86%), FVC 3.60(78%), FEV1% 73, TLC 7.26(105%), DLCO 112%, no BD      - RAST and IgE check on 07/23/09.  His IgE was 527.7. Chronic sinusitis Diverticulosis GERD Rheumatoid arthritis Prostate Cancer  Irritable bowel syndrome Anxiety Neurodermatitis Erectile dysfunction Obstructive sleep apnea      - BPAP 14/10 (12/09/09)>>stopped after weight loss Allergic rhinitis Hypertension Adenomatous Colon Polyps 1990 Depression  Past Surgical History: Reviewed history from 03/26/2010 and no changes required. Radical prostatectomy Left knee arthroscopy Sinus surgery 2009 (deviated septum/polyps) Urethral stricture  repair  4/11  Dr Achilles Dunk  Vital Signs:  Patient profile:   77 year old male Height:      71 inches Weight:      192 pounds O2 Sat:      99 % on Room air Temp:     97.4 degrees F oral Pulse rate:   83 / minute BP sitting:   134 / 72  (right arm) Cuff size:   regular  Vitals Entered By: Carver Fila (July 02, 2010 3:01 PM)  O2 Flow:  Room air CC: 4 week follow up, breathing is doing good, pt states he barely uses his inhalers bc his breathing is doing that good, pt states his reflux has made him to have a little cough but then goes away, pt has no concerns today Is Patient Diabetic? No Comments meds and allergies updated Phone number updated Carver Fila  July 02, 2010 3:01 PM    Physical Exam  General:  thin.   Nose:  no nasal discharge, no sinus tenderness Mouth:  no oral lesion Neck:  no JVD.   Lungs:  no wheezing or rales Heart:  regular rhythm, normal rate, and no murmurs.   Extremities:  minimal joint swelling in his hands Cervical Nodes:  no significant adenopathy   Impression & Recommendations:  Problem # 1:  ASTHMA (ICD-493.90) He has been stable off singulair.  He is to continue symbicort and as needed albuterol.  Due to high pre-treatment IgE level he will still require 375 mg xolair every two weeks even though he has lost some weight.  Explained that there was no benefit to rechecking his IgE while on xolair since it will also be elevated as a result of xolair treatment.  Problem # 2:  RHEUMATOID ARTHRITIS (ICD-714.0) He is to follow up with rheumatology to determine plan for further weaning of prednisone.  Problem # 3:  ALLERGIC RHINITIS (ICD-477.9) Stable.  Problem # 4:  OBSTRUCTIVE SLEEP APNEA (ICD-327.23) Likely has improvement in his sleep apnea with his weight loss.  Will hold off on restarting BPAP for now.  Problem # 5:  ANXIETY (ICD-300.00) He is to continue on xanax as needed.  Medications Added to Medication List This Visit: 1)  Xolair 150 Mg  Solr (Omalizumab) .... 375 mg every two weeks subcutaneously  Complete Medication List: 1)  Levsin/sl 0.125 Mg Subl (Hyoscyamine sulfate) .Marland Kitchen.. 1-2 tablets by mouth under tongue or by mouth four times a day as needed 2)  Symbicort 160-4.5 Mcg/act Aero (Budesonide-formoterol fumarate) .... 2 puffs two times a day 3)  Xolair 150 Mg Solr (Omalizumab) .... 375 mg every two weeks subcutaneously 4)  Albuterol Sulfate (2.5 Mg/35ml) 0.083% Nebu (Albuterol sulfate) .... One vial nebulized up to four times per day as needed 5)  Ventolin Hfa 108 (90 Base) Mcg/act Aers (Albuterol sulfate) .... Two puffs up to four times per day as  needed 6)  Methotrexate 2.5 Mg Tabs (Methotrexate sodium) .... 9 tablets weekly for rheumatoid arthritis 7)  Prednisone 5 Mg Tabs (Prednisone) .... Take 1  by mouth once daily 8)  Metoprolol Succinate 50 Mg Xr24h-tab (Metoprolol succinate) .Marland Kitchen.. 1 tab daily for high blood pressure 9)  Adult Aspirin Ec Low Strength 81 Mg Tbec (Aspirin) .... Take 1 tablet by mouth once a day 10)  Temazepam 15 Mg Caps (Temazepam) .... Take 1or 2 at bedtime as needed to help sleep 11)  Alprazolam 0.5 Mg Tabs (Alprazolam) .Marland Kitchen.. 1-2 tabs by mouth three times a day as needed for nerves 12)  Multivitamins Tabs (Multiple vitamin) .... Take 1 tablet by mouth once a day 13)  Miralax Powd (Polyethylene glycol 3350) .Marland KitchenMarland KitchenMarland Kitchen 17 grams in 8 oz of water 1-2 x daily 14)  Epipen 2-pak 0.3 Mg/0.28ml Devi (Epinephrine) .... Use as directed as needed for severe allergic reaction 15)  Omeprazole 20 Mg Tbec (Omeprazole) .... Take 1 by mouth once daily 16)  Mirtazapine 15 Mg Tabs (Mirtazapine) .Marland Kitchen.. 1 tab at bedtime for depression and poor eating 17)  Dulcolax Stool Softener 100 Mg Caps (Docusate sodium) .... 2 by mouth two times a day 18)  Pravastatin Sodium 20 Mg Tabs (Pravastatin sodium) .Marland Kitchen.. 1 tab daily for high cholesterol 19)  Hydroxychloroquine Sulfate 200 Mg Tabs (Hydroxychloroquine sulfate) .Marland Kitchen.. 1 tab by mouth two times a  day for rheumatoid arthritis  Other Orders: Est. Patient Level III (16109)  Patient Instructions: 1)  Follow up in 4 to 6 weeks

## 2010-12-28 NOTE — Letter (Signed)
Summary: Dr.G.Lyna Poser  Dr.G.Lyna Poser   Imported By: Beau Fanny 06/04/2010 16:10:46  _____________________________________________________________________  External Attachment:    Type:   Image     Comment:   External Document  Appended Document: Dr.G.Earlene Plater [facility],Rheumatology,Note left shoulder injected prednisone decreased to 5mg  daily

## 2010-12-29 ENCOUNTER — Encounter: Payer: Self-pay | Admitting: Gastroenterology

## 2010-12-30 ENCOUNTER — Ambulatory Visit: Admit: 2010-12-30 | Payer: Self-pay | Admitting: Pulmonary Disease

## 2010-12-30 ENCOUNTER — Ambulatory Visit: Payer: Self-pay

## 2010-12-30 NOTE — Letter (Signed)
[facility]   Imported By: Beau Fanny 12/09/2010 08:42:45  _____________________________________________________________________  External Attachment:    Type:   Image     Comment:   External Document

## 2010-12-30 NOTE — Progress Notes (Signed)
  Phone Note From Other Clinic   Caller: Dr Cherylann Ratel Summary of Call: He called earlier Seeing patient today  Explained that he does expect water diuresis more than naturesis with lasix and salt tablets Has gained back about 10# since my visit He is going to restart lasix --but at reduced dose of 20mg  daily he will send his note and labs that he is drawing today Initial call taken by: Cindee Salt MD,  December 15, 2010 5:57 PM    New/Updated Medications: FUROSEMIDE 20 MG TABS (FUROSEMIDE) 1 tab daily

## 2010-12-30 NOTE — Assessment & Plan Note (Signed)
Summary: 2WK FOLLOW UP / LFW   Vital Signs:  Patient profile:   77 year old male Weight:      195 pounds Temp:     98.6 degrees F oral Pulse rate:   74 / minute Pulse rhythm:   regular BP sitting:   133 / 64  (left arm) Cuff size:   large  Vitals Entered By: Mervin Hack CMA [name]) (December 21, 2010 11:36 AM) CC: 2 week follow-up   History of Present Illness: Had gained  ~10# off the lasix Put back on lasix at lower dose by Dr Letitia Libra Weight back down 3# again Mild ankle edema Sodium was still up at 140  Still quiet with the asthma happy with his regimen  Recent appt with Dr Achilles Dunk Concerned about infection---he was okay with changing catheter more frequently but not sure it would help Not ready to try intermittent catheterization yet Will reconsider in 3 months  Ongoing arthritis pain still considerable pain Now back on MTX so hopes that improves the RA pain  Bowels okay with once daily miralax  Allergies: 1)  ! Altace 2)  ! Zocor 3)  ! Celexa 4)  ! Ativan 5)  ! Paxil 6)  ! Vesicare (Solifenacin Succinate)  Past History:  Past medical, surgical, family and social histories (including risk factors) reviewed for relevance to current acute and chronic problems.  Past Medical History: Reviewed history from 12/02/2010 and no changes required. CAD Admission 12/11 with confusion,hyponatremia-SIADH neurogenic bladder Asthma      - PFT 04/07/10 FEV1 2.61(86%), FVC 3.60(78%), FEV1% 73, TLC 7.26(105%), DLCO 112%, no BD      - RAST and IgE check on 07/23/09.  His IgE was 527.7. Chronic sinusitis Diverticulosis GERD Rheumatoid arthritis Prostate Cancer  Irritable bowel syndrome Anxiety Neurodermatitis Erectile dysfunction Obstructive sleep apnea      - BPAP 14/10 (12/09/09)>>stopped after weight loss Allergic rhinitis Hypertension Adenomatous Colon Polyps 1990 diverticulosis Depression  Past Surgical History: Reviewed history from 03/26/2010 and no  changes required. Radical prostatectomy Left knee arthroscopy Sinus surgery 2009 (deviated septum/polyps) Urethral stricture repair  4/11  Dr Achilles Dunk  Family History: Reviewed history from 05/04/2009 and no changes required. Son - asthma, allergies Mother-deceased at age 58, heart failure Father-deceased at age 44 pneumonia 4 sisters healthy  Social History: Reviewed history from 05/12/2008 and no changes required. 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 Never Smoked Alcohol use-occasional Regular exercise-no  Review of Systems       appetite is fine still not sleeping well--sleeping pills stopped by Dr Cherylann Ratel (due to delerium) Has tried some xanax  Physical Exam  General:  alert and normal appearance.   Neck:  supple, no masses, and no cervical lymphadenopathy.   Lungs:  normal respiratory effort, no intercostal retractions, no accessory muscle use, and normal breath sounds.   Heart:  normal rate, regular rhythm, no murmur, and no gallop.   Extremities:  trace to 1+ edema on left only Psych:  normally interactive, good eye contact, and slightly anxious.     Impression & Recommendations:  Problem # 1:  HYPONATREMIA (ICD-276.1) Assessment Improved  back to normal will leave fluid restriction and treatment to Dr Cherylann Ratel (he still has  a big problem with the fluid restriction)  Orders: TLB-BMP (Basic Metabolic Panel-BMET) (80048-METABOL) Venipuncture (16109)  Problem # 2:  SLEEP DISORDER, CHRONIC (ICD-780.50) Assessment: Deteriorated hard time off temazepam will try trazodone  Problem # 3:  NEUROGENIC BLADDER (ICD-596.54) Assessment: Unchanged stable with Foley will consider  intermittent catheterization in a few months if stable  Problem # 4:  RHEUMATOID ARTHRITIS (ICD-714.0) Assessment: Comment Only hopefully will be improving back on MTX  His updated medication list for this problem includes:    Prednisone 1 Mg Tabs (Prednisone) .Marland Kitchen... Take as  directed    Methotrexate 2.5 Mg Tabs (Methotrexate sodium) .Marland Kitchen... 9 tablets weekly for rheumatoid arthritis- hold until 12/07/10    Adult Aspirin Ec Low Strength 81 Mg Tbec (Aspirin) .Marland Kitchen... Take 1 tablet by mouth once a day  Problem # 5:  EXTRINSIC ASTHMA, UNSPECIFIED (ICD-493.00) Assessment: Improved still doing well on  xolair  His updated medication list for this problem includes:    Ipratropium-albuterol 0.5-2.5 (3) Mg/65ml Soln (Ipratropium-albuterol) ..... One vial nebulized four times per day as needed    Symbicort 160-4.5 Mcg/act Aero (Budesonide-formoterol fumarate) .Marland Kitchen..Marland Kitchen Two puffs two times a day    Prednisone 1 Mg Tabs (Prednisone) .Marland Kitchen... Take as directed    Xolair 150 Mg Solr (Omalizumab) .Marland KitchenMarland KitchenMarland KitchenMarland Kitchen 375 mg every two weeks subcutaneously  Complete Medication List: 1)  Ipratropium-albuterol 0.5-2.5 (3) Mg/99ml Soln (Ipratropium-albuterol) .... One vial nebulized four times per day as needed 2)  Symbicort 160-4.5 Mcg/act Aero (Budesonide-formoterol fumarate) .... Two puffs two times a day 3)  Prednisone 1 Mg Tabs (Prednisone) .... Take as directed 4)  Furosemide 20 Mg Tabs (Furosemide) .Marland Kitchen.. 1 tab daily 5)  Xolair 150 Mg Solr (Omalizumab) .... 375 mg every two weeks subcutaneously 6)  Methotrexate 2.5 Mg Tabs (Methotrexate sodium) .... 9 tablets weekly for rheumatoid arthritis- hold until 12/07/10 7)  Metoprolol Succinate 50 Mg Xr24h-tab (Metoprolol succinate) .Marland Kitchen.. 1 tab daily for high blood pressure 8)  Omeprazole 20 Mg Tbec (Omeprazole) .... Take 1 by mouth once daily 9)  Pravastatin Sodium 20 Mg Tabs (Pravastatin sodium) .Marland Kitchen.. 1 tab daily for high cholesterol 10)  Hydroxychloroquine Sulfate 200 Mg Tabs (Hydroxychloroquine sulfate) .Marland Kitchen.. 1 tab by mouth two times a day for rheumatoid arthritis 11)  Gabapentin 300 Mg Caps (Gabapentin) .Marland Kitchen.. 1 tab by mouth three times a day for neuropathy 12)  Adult Aspirin Ec Low Strength 81 Mg Tbec (Aspirin) .... Take 1 tablet by mouth once a day 13)   Multivitamins Tabs (Multiple vitamin) .... Take 1 tablet by mouth once a day 14)  Miralax Powd (Polyethylene glycol 3350) .Marland KitchenMarland KitchenMarland Kitchen 17 grams in 8 oz of water 1-2 x daily 15)  Nacl Tablets 1 Gm  .... Take 1 tablet by mouth three times a day 16)  Trazodone Hcl 50 Mg Tabs (Trazodone hcl) .Marland Kitchen.. 1-2 tabs at bedtime to help sleep  Patient Instructions: 1)  Please schedule a follow-up appointment in 2 months.  Prescriptions: TRAZODONE HCL 50 MG TABS (TRAZODONE HCL) 1-2 tabs at bedtime to help sleep  #60 x 11   Entered and Authorized by:   Cindee Salt MD   Signed by:   Cindee Salt MD on 12/21/2010   Method used:   Print then Give to Patient   RxID:   [id number]    Orders Added: 1)  Est. Patient Level IV [14782] 2)  TLB-BMP (Basic Metabolic Panel-BMET) [80048-METABOL] 3)  Venipuncture [95621]    Current Allergies (reviewed today): ! ALTACE ! ZOCOR ! CELEXA ! ATIVAN ! PAXIL ! VESICARE (SOLIFENACIN SUCCINATE)

## 2010-12-30 NOTE — Progress Notes (Signed)
Summary: Triage   Phone Note Call from Patient Call back at [phone]   Caller: Patient Call For: Dr. Russella Dar Reason for Call: Talk to Nurse Summary of Call: Released from the hosp. yesterday and requesting to speak directly to nurse Initial call taken by: Karna Christmas,  December 02, 2010 10:56 AM  Follow-up for Phone Call        Patient was in the hospital for several days due to hyponatremia.  He was admitted to [facility].  Having some loose stool yesterday and passing some blood this am. He is straining to have a BM today and just passing some blood.  He was on antibiotics while admitted and is still on antibiotics.  He will come in and see Amy Esterwood PA this pm at 2:30   Follow-up by: Darcey Nora RN, CGRN,  December 02, 2010 11:28 AM  Additional Follow-up for Phone Call Additional follow up Details #1::        Above noted. Additional Follow-up by: Meryl Dare MD Clementeen Graham,  December 02, 2010 5:26 PM

## 2010-12-30 NOTE — Assessment & Plan Note (Signed)
Summary: blood in stool/loose stool yesterday.    History of Present Illness Visit Type: Follow-up Visit Primary GI MD: Elie Goody MD [facility] Primary Provider: Tillman Abide, MD Chief Complaint: Thick clot of dark red blood on tissue, patient had abdominal cramping prior to discharge; he strained a lot to have bowel movement wich was diarrhea History of Present Illness:   77 YO MALE KNOWN TO DR. Russella Dar. HE HAD A COLONOSCOPY IN 6/11 WHICH SHOWED DIVERTICULOSIS ONLY.  HE HAS MULTIPLE MEDICAL PROBLEMS INCLUDING RHEUMATOID ARTHRITIS,NEUROGENIC BLADDER FOR WHICH HE IS CURRENTLY USING AN INDWELLING CATHETER,ASTHMA,CAD,SLEEP APNEA.  HE WAS JUST DISCHARGED FROM [facility] ON 1/3 AFTER  AN 8 DAY SATY WITH A UTI, AND HYPONATREMIA ASSOCIATED WITH MENTAL  STATUS CHANGES. HE IS FELT TO HAVE SIADH, AND IS NOW ON FLUID RESTRICTION, AND LASIX 40 MG DAILY  IN ADDITION TO HIS REGULAR DOSE OF METOPROLOL. HE HAD ONSET YESTERDAY 1/4 WITH INTENSE CRAMPY ABDOMINAL PAINS FOLLOWED BY LOOSE THIN STOOLS. LATER IN DAY HE PASSED LIQUID STOOL AND DID NOT LOOK TOP SEE IF ANY BLOOD.NO VOMITING, VAGUE NAUSEA, NO DIAPHORESIS. PAIN MOSTLY LEFT SIDED. THIS AM EARLY HE HAD A LOOSE TO WATERY BM AND PASSED  A " LARGE GLOB OF DARK MAROON BLOOD". THIS HAS NOT RECURRED SINCE. HE HAS SOME PERSISTENT LAEFT SIDIED ABDOMINAL PAIN, AND CRAMPING.  HE IS CURENTLY COMPLETING HIS COURSE OF CIPRO , AND HAS MULTIPLE CONCERNS BECAUSE OF NEED FOR FLUID RESTRICTION ETC. HIS BP IS LOW  TODAY-96/58,PULSE 100. HE AND HIS WIFE RELATE HIS NORMAL BP IS AROUND 130 /80-90, THOUGH IT APPARENTLY WAS LOWER WHEN HE WAS IN THE HOSPITAL .  I DO NOT HAVE COPY  OF LABS FROM [place] AVAILABLE.   GI Review of Systems    Reports abdominal pain, bloating, and  nausea.     Location of  Abdominal pain: LLQ.    Denies acid reflux, belching, chest pain, dysphagia with liquids, dysphagia with solids, heartburn, loss of appetite, vomiting, vomiting blood, weight  loss, and  weight gain.      Reports change in bowel habits, constipation, diarrhea, and  rectal bleeding.     Denies anal fissure, black tarry stools, diverticulosis, fecal incontinence, heme positive stool, hemorrhoids, irritable bowel syndrome, jaundice, light color stool, liver problems, and  rectal pain.    Current Medications (verified): 1)  Hydrocodone-Acetaminophen 5-325 Mg Tabs (Hydrocodone-Acetaminophen) .Marland Kitchen.. 1 Up To Four Times Daily For Pain 2)  Xolair 150 Mg Solr (Omalizumab) .... 375 Mg Every Two Weeks Subcutaneously 3)  Ventolin Hfa 108 (90 Base) Mcg/act Aers (Albuterol Sulfate) .... Two Puffs Up To Four Times Per Day As Needed 4)  Methotrexate 2.5 Mg Tabs (Methotrexate Sodium) .... 9 Tablets Weekly For Rheumatoid Arthritis- Hold Until 12/07/10 5)  Metoprolol Succinate 50 Mg Xr24h-Tab (Metoprolol Succinate) .Marland Kitchen.. 1 Tab Daily For High Blood Pressure 6)  Temazepam 15 Mg  Caps (Temazepam) .... Take 1or 2 At Bedtime As Needed To Help Sleep 7)  Alprazolam 0.5 Mg Tabs (Alprazolam) .Marland Kitchen.. 1-2 Tabs By Mouth Three Times A Day As Needed For Nerves 8)  Omeprazole 20 Mg Tbec (Omeprazole) .... Take 1 By Mouth Once Daily 9)  Mirtazapine 15 Mg Tabs (Mirtazapine) .Marland Kitchen.. 1 Tab At Bedtime For Depression and Poor Eating 10)  Pravastatin Sodium 20 Mg Tabs (Pravastatin Sodium) .Marland Kitchen.. 1 Tab Daily For High Cholesterol 11)  Hydroxychloroquine Sulfate 200 Mg Tabs (Hydroxychloroquine Sulfate) .Marland Kitchen.. 1 Tab By Mouth Two Times A Day For Rheumatoid Arthritis 12)  Gabapentin 300 Mg  Caps (Gabapentin) .Marland Kitchen.. 1 Tab By Mouth Three Times A Day For Neuropathy 13)  Adult Aspirin Ec Low Strength 81 Mg  Tbec (Aspirin) .... Take 1 Tablet By Mouth Once A Day 14)  Multivitamins   Tabs (Multiple Vitamin) .... Take 1 Tablet By Mouth Once A Day 15)  Miralax  Powd (Polyethylene Glycol 3350) .Marland KitchenMarland KitchenMarland Kitchen 17 Grams in 8 Oz of Water 1-2 X Daily 16)  Dulcolax Stool Softener 100 Mg Caps (Docusate Sodium) .... 2 By Mouth Two Times A Day 17)  Prednisone  1 Mg Tabs (Prednisone) .... Take As Directed 18)  Cipro 500 Mg Tabs (Ciprofloxacin Hcl) .... Take 1 Tablet By Mouth Two Times A Day For 3 Days 19)  Symbicort 160-4.5 Mcg/act Aero (Budesonide-Formoterol Fumarate) .... Two Puffs Two Times A Day 20)  Ipratropium-Albuterol 0.5-2.5 (3) Mg/56ml Soln (Ipratropium-Albuterol) .... One Vial Nebulized Four Times Per Day As Needed 21)  First-Dukes Mouthwash  Susp (Diphenhyd-Hydrocort-Nystatin) .... 5cc Swish and Spit Four Times Daily Till Resolved 22)  Lasix 40 Mg Tabs (Furosemide) .... Take 1 Tablet By Mouth Once Daily  Hold 23)  Metoprolol Tartrate 50 Mg Tabs (Metoprolol Tartrate) .... Take 1 Tablet By Mouth Once Daily  Hold 24)  Klor-Con M20 20 Meq Cr-Tabs (Potassium Chloride Crys Cr) .... Take 1 Tablet By Mouth Once Daily 25)  Nacl Tablets 1 Gm .... Take 1 Tablet By Mouth Three Times A Day  Allergies: 1)  ! Altace 2)  ! Zocor 3)  ! Celexa 4)  ! Ativan 5)  ! Paxil  Past History:  Past Medical History: CAD Admission 12/11 with confusion,hyponatremia-SIADH neurogenic bladder Asthma      - PFT 04/07/10 FEV1 2.61(86%), FVC 3.60(78%), FEV1% 73, TLC 7.26(105%), DLCO 112%, no BD      - RAST and IgE check on 07/23/09.  His IgE was 527.7. Chronic sinusitis Diverticulosis GERD Rheumatoid arthritis Prostate Cancer  Irritable bowel syndrome Anxiety Neurodermatitis Erectile dysfunction Obstructive sleep apnea      - BPAP 14/10 (12/09/09)>>stopped after weight loss Allergic rhinitis Hypertension Adenomatous Colon Polyps 1990 diverticulosis Depression  Past Surgical History: Reviewed history from 03/26/2010 and no changes required. Radical prostatectomy Left knee arthroscopy Sinus surgery 2009 (deviated septum/polyps) Urethral stricture repair  4/11  Dr Achilles Dunk  Family History: Reviewed history from 05/04/2009 and no changes required. Son - asthma, allergies Mother-deceased at age 5, heart failure Father-deceased at age 33 pneumonia 4  sisters healthy  Social History: Reviewed history from 05/12/2008 and no changes required. 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  The patient denies allergy/sinus, anemia, anxiety-new, back pain, breast changes/lumps, change in vision, confusion, cough, coughing up blood, depression-new, fainting, fatigue, fever, headaches-new, hearing problems, heart murmur, heart rhythm changes, itching, menstrual pain, muscle pains/cramps, night sweats, nosebleeds, pregnancy symptoms, shortness of breath, skin rash, sleeping problems, sore throat, swelling of feet/legs, swollen lymph glands, thirst - excessive , urination - excessive , urination changes/pain, urine leakage, vision changes, and voice change.         see hpi  Vital Signs:  Patient profile:   77 year old male Height:      71 inches Weight:      185.25 pounds BMI:     25.93 Pulse rate:   100 / minute Pulse rhythm:   regular BP sitting:   96 / 58  (left arm) Cuff size:  regular  Vitals Entered By: June McMurray CMA [name]) (December 02, 2010 3:01 PM)  Physical Exam  General:  Well developed, well nourished, no acute distress.,elderly, chronically ill appearing Head:  Normocephalic and atraumatic. Eyes:  PERRLA, no icterus. Lungs:  Clear throughout to auscultation. Heart:  Regular rate and rhythm; no murmurs, rubs,  or bruits. Abdomen:  SOFT, TENDER LMQ/LLQ, NO GUARDONG OR REBOUND, NO MASS OR HSM,BS+ Rectal:  THIN MUCOID HEME ON EXAMINING GLOVE, NO HEMORRHOID OR OTHER LESION NOTED Extremities:  No clubbing, cyanosis, edema or deformities noted. Neurologic:  Alert and  oriented x4;  grossly normal neurologically.weakness noted.   Psych:  Alert and cooperative. Normal mood and affect.   Impression & Recommendations:  Problem # 1:  RECTAL BLEEDING (ICD-569.3) Assessment  New 77 YO MALE  WITH MULTIPLE MEDICAL PROBLEMS,JUST DISCHARGED FROM [facility] WITH UTI AND HYPONATREMIA FELT SECONDARY TO SIADH. NOW WITH RELATIVE HYPOTENSION,LEFT SIDED ABDOMINAL CRAMPING AND BRB PER RECTUM.R/O SEGMENTAL ISCHEMIC COLITIS.  LABS AS BELOW HOLD LASIX NEXT 24 HOURS HOLD METOPROLOL NEXT 24 HOURS HOLD MIRALAX SCHEDULE FOR CT ABD/PELVIS IN AM- WILL RECHECK PT IN OFFICE AFTER CT SCAN. ADVISED WIFE TO TAKE HIM TO THE ER IF HE DEVELOPS MORE VOLUMINOUS RECTAL BLEEDING. Orders: CT Angiogram (CT Angio) TLB-BMP (Basic Metabolic Panel-BMET) (80048-METABOL) TLB-CBC Platelet - w/Differential (85025-CBCD)  Problem # 2:  EXTRINSIC ASTHMA, UNSPECIFIED (ICD-493.00) Assessment: Comment Only  Problem # 3:  NEUROGENIC BLADDER (ICD-596.54) Assessment: Comment Only HAS CATHETER  Problem # 4:  PERSONAL HISTORY OF COLONIC POLYPS (ICD-V12.72) Assessment: Comment Only LAST COLON 6/11-NO POLYPS  Problem # 5:  HYPERTENSION, HEART CONTROLLED W/ CHF (ICD-402.11) Assessment: Comment Only  Problem # 6:  ADENOCARCINOMA, PROSTATE, S/P PROSTATECTOMY (ICD-185) Assessment: Comment Only  Problem # 7:  RHEUMATOID ARTHRITIS (ICD-714.0) Assessment: Comment Only  Problem # 8:  CORONARY ARTERY DISEASE (ICD-414.00) Assessment: Comment Only  Problem # 9:  SIADH Assessment: Comment Only NEW DX- PT ON FLUID RESTRICTION, AND NEW DIURETIC--CONTRIBUTING TO HIS HYPOTENSION TODAY  SEE #1  Patient Instructions: 1)  Please go to lab, basement level. 2)  We scheduled the CT scan at [facility], [address] 3)  Directions provided. 4)  Do not take the lasix tomorrow morning. 5)  Do not take the Metoprolol.  6)  Copy sent to : Tillman Abide, MD 7)  The medication list was reviewed and reconciled.  All changed / newly prescribed medications were explained.  A complete medication list was provided to the patient / caregiver.

## 2010-12-30 NOTE — Progress Notes (Signed)
Summary: Patient is at [facility]  Phone Note Call from Patient Call back at Home Phone [phone]   Caller: Spouse Call For: Cindee Salt MD Summary of Call: Patients spouse called to let Dr. Alphonsus Sias know that Mr. Cadman is in the hospital at [facility].  They are treating him for sodium imbalance and they will be doing a MRI of the brain since he has been disoriented and confused.  His room number is [id number] if you would like to call and speak with his wife. Initial call taken by: Linde Gillis CMA [name]),  November 23, 2010 1:56 PM  Follow-up for Phone Call        spoke to him Sodium down to 119 on admission Had fluid restriction and ?3%NaCl (briefly)  Still some confusion--called doctors "teachers" may be leaving soon He isn't aware of other concerns for now Wife is there but I didn't speak to her Cindee Salt MD  November 23, 2010 2:39 PM   Sodium up to 131 now Told he has 3 "seperate urinary tract infections" On IV antibiotics Still weak sounds more clear today Cindee Salt MD  November 24, 2010 2:11 PM   feeling better Had EEG and neuro consult hopes to go home within 2-3 days will plan follow up in 10-12 days after discharge Follow-up by: Cindee Salt MD,  November 25, 2010 1:27 PM

## 2010-12-30 NOTE — Assessment & Plan Note (Signed)
Summary: xolair/mhh   Nurse Visit   Allergies: 1)  ! Cipro 2)  ! Altace 3)  ! Zocor 4)  ! Celexa 5)  ! Ativan 6)  ! Paxil  Medication Administration  Injection # 1:    Medication: Xolair (omalizumab) 150mg     Diagnosis: EXTRINSIC ASTHMA, UNSPECIFIED (ICD-493.00)    Route: SQ    Site: R deltoid    Exp Date: 11/2013    Lot #: [id number]    Mfr: Genetech    Comments: 1.0 ML X 2 IN RIGHT AND 1.0 IN LEFT 375MG  CHARGED J2357 AND 81191    Patient tolerated injection without complications    Given by: SUSANNE FORD IN ALLERGY LAB  Orders Added: 1)  Xolair (omalizumab) 150mg  [J2357] 2)  Administration xolair injection [[id number]]   Medication Administration  Injection # 1:    Medication: Xolair (omalizumab) 150mg     Diagnosis: EXTRINSIC ASTHMA, UNSPECIFIED (ICD-493.00)    Route: SQ    Site: R deltoid    Exp Date: 11/2013    Lot #: [id number]    Mfr: [employer]etech    Comments: 1.0 ML X 2 IN RIGHT AND 1.0 IN LEFT 375MG  CHARGED J2357 AND 86578    Patient tolerated injection without complications    Given by: SUSANNE FORD IN ALLERGY LAB  Orders Added: 1)  Xolair (omalizumab) 150mg  [J2357] 2)  Administration xolair injection [[id number]]

## 2010-12-30 NOTE — Miscellaneous (Signed)
Summary: Care Update/[facility]  Care Update/[facility]   Imported By: Lanelle Bal 12/13/2010 14:23:51  _____________________________________________________________________  External Attachment:    Type:   Image     Comment:   External Document

## 2010-12-30 NOTE — Letter (Signed)
Summary: [facility]  [facility]   Imported By: Lanelle Bal 11/09/2010 13:53:24  _____________________________________________________________________  External Attachment:    Type:   Image     Comment:   External Document  Appended Document: [facility] Foley replaced with 83F  nystatin for perineal candidiasis

## 2010-12-30 NOTE — Miscellaneous (Signed)
Summary: Physician Communication/[facility]  Physician Communication/[facility]   Imported By: Maryln Gottron 12/22/2010 12:13:47  _____________________________________________________________________  External Attachment:    Type:   Image     Comment:   External Document

## 2010-12-30 NOTE — Assessment & Plan Note (Signed)
Summary: Geoffry Paradise ///KP   Nurse Visit   Allergies: 1)  ! Cipro 2)  ! Altace 3)  ! Zocor 4)  ! Celexa 5)  ! Ativan 6)  ! Paxil  Medication Administration  Injection # 1:    Medication: Xolair (omalizumab) 150mg     Diagnosis: EXTRINSIC ASTHMA, UNSPECIFIED (ICD-493.00)    Route: SQ    Site: L deltoid    Exp Date: 11/2013    Lot #: [id number]    Mfr: [employer]    Comments: 1.0 ML X 2 IN LEFT AND 1.0ML IN RIGHT PT WAITED 20 MINS 375MG  J2357 AND 95621    Patient tolerated injection without complications    Given by: SUSANNE FORD IN ALLERGY LAB  Orders Added: 1)  Xolair (omalizumab) 150mg  [J2357] 2)  Administration xolair injection [[id number]]   Medication Administration  Injection # 1:    Medication: Xolair (omalizumab) 150mg     Diagnosis: EXTRINSIC ASTHMA, UNSPECIFIED (ICD-493.00)    Route: SQ    Site: L deltoid    Exp Date: 11/2013    Lot #: [id number]    Mfr: [employer]    Comments: 1.0 ML X 2 IN LEFT AND 1.0ML IN RIGHT PT WAITED 20 MINS 375MG  J2357 AND 29528    Patient tolerated injection without complications    Given by: SUSANNE FORD IN ALLERGY LAB  Orders Added: 1)  Xolair (omalizumab) 150mg  [J2357] 2)  Administration xolair injection [[id number]]

## 2010-12-30 NOTE — Assessment & Plan Note (Signed)
Summary: xolair/mhh   Nurse Visit   Allergies: 1)  ! Altace 2)  ! Zocor 3)  ! Celexa 4)  ! Ativan 5)  ! Paxil 6)  ! Vesicare (Solifenacin Succinate)  Medication Administration  Injection # 1:    Medication: Xolair (omalizumab) 150mg     Diagnosis: EXTRINSIC ASTHMA, UNSPECIFIED (ICD-493.00)    Route: SQ    Site: L deltoid    Exp Date: 11/2013    Lot #: [id number]    Mfr: [employer]    Comments: 1.0 ML X 2 LEFT AND RIGHT ARM 300 CHARGED [id number] AND [id number]    Given by: Dimas Millin IN ALLERGY LAB  Orders Added: 1)  Xolair (omalizumab) 150mg  [J2357] 2)  Administration xolair injection [id number]   Medication Administration  Injection # 1:    Medication: Xolair (omalizumab) 150mg     Diagnosis: EXTRINSIC ASTHMA, UNSPECIFIED (ICD-493.00)    Route: SQ    Site: L deltoid    Exp Date: 11/2013    Lot #: [id number]    Mfr: [employer]    Comments: 1.0 ML X 2 LEFT AND RIGHT ARM 300 CHARGED [id number] AND 47829    Given by: Babette Relic SCOTT IN ALLERGY LAB  Orders Added: 1)  Xolair (omalizumab) 150mg  [J2357] 2)  Administration xolair injection [[id number]]

## 2010-12-30 NOTE — Letter (Signed)
Summary: Dr.G.Lyna Poser  Dr.G.Wallace [facility],Rheumatology,Note   Imported By: Beau Fanny 11/05/2010 16:41:07  _____________________________________________________________________  External Attachment:    Type:   Image     Comment:   External Document  Appended Document: Dr.G.Earlene Plater [facility],Rheumatology,Note left shoulder injected increased MTX to 10mg  weekly  Appended Document: Dr.G.Earlene Plater [facility],Rheumatology,Note left shoulder injected increase MTX to 10 weekly PT referral

## 2010-12-30 NOTE — Assessment & Plan Note (Signed)
  OFFICE RECHECK- PT WAS SEEN YESTERDAY-BACK TODAY AFTER CT SCAN TO REVIEW AND REPEAT BP ETC.  HE FEELS OK. HE IS VERY ANXIOUS AND ASKS MULTIPLE QUESTIONS. NO BM OR ANY FURTHER BLEEDING,THOUGH HIS ABDOMEN IS STILL BOTHERING HIM AND HE FEELS LIKE HE NEEDS TO HAVE A BM. NA 127,WBC 12.8,HGB 13.6,CREAT 0.7 CT SHOWS DIVERTICULOSIS,ATHEROSCLEROTIC CHANGES,MESENTERIC VESSELS PATENT, NO INFLAMMATORY CHANGE TO  CONFIRM ISCHEMIC COLITIS.   BP MUCH BETTER  HE IS ENCOURAGED TO TRY A BLAND SOFT DIET RESTART MIRALAX TONIGHT, MAY TAKE TWICE DAILY IF NEEDED. STOP CIPRO AS BELIEVE THIS IS CAUSING SOME OF HIS GI SXS RESTART LASIX 1/2 OF A 40 MG TABLET IN AM RESTART METOPROLOL 1/2 OF A 50 MG TABLET IN AM. OK TO TAKE A FEW ADVIL FOR ARTHRITIS  SXS . KEEP FOLLOW UP WITH DR. Alphonsus Sias MONDAY , AND WILL NEED REPEAT CBC, BMET WE CAN CONSIDER A FLEX SIGMOID IF HIS BLEEDING PERSISTS. Bland diet. Stop Cipro medication. Restart Lasix and Metoprolol. Cut both pills or doses in half. It is okay to take a few advil each day if needed. Keep your follow up with Dr Alphonsus Sias on Mon 12-06-2010.   Vital Signs:  Patient profile:   77 year old male Height:      71 inches Weight:      185 pounds BMI:     25.90 Pulse rate:   88 / minute Pulse rhythm:   regular BP sitting:   120 / 78  (left arm)  Vitals Entered By: Lowry Ram NCMA (December 03, 2010 5:04 PM)  Allergies: 1)  ! Altace 2)  ! Zocor 3)  ! Celexa 4)  ! Ativan 5)  ! Paxil

## 2010-12-30 NOTE — Assessment & Plan Note (Signed)
Summary: [facility] FOLLOW-UP/JRR   Vital Signs:  Patient profile:   77 year old male Weight:      189 pounds Temp:     97.8 degrees F oral Pulse rate:   86 / minute Pulse rhythm:   regular BP sitting:   118 / 68  (left arm) Cuff size:   regular  Vitals Entered By: Mervin Hack CMA [name]) (December 07, 2010 12:22 PM) CC: hospital follow-up   History of Present Illness: "I've been through hell" Having trouble with the fluid restriction-- 1200cc daily is very hard. Just taking meds takes a lot of fluid Hard to use miralax which he needs for the bowels--240cc with each dose and does best with two times a day  Taking 3 salt tablets daily also  recent GI eval due to bloody stool CT reassuring but still having trouble moving his bowels blood has now resolved  had delerium in hospital now resolved off vesicare now--discussed that this may have been the major problem (in addition to hyponatremia and UTI)  Still with catheter chronically uses leg bag during the day Wife still changes the bags  Ongoing arthritic pain and is much worse off the methotrexate advised him to restart MTX  Allergies: 1)  ! Altace 2)  ! Zocor 3)  ! Celexa 4)  ! Ativan 5)  ! Paxil 6)  ! Vesicare (Solifenacin Succinate)  Review of Systems       appetite is better again weight is up 4# now ---half of what he lost in hospital  Physical Exam  General:  alert.  NAD Neck:  supple, no masses, no thyromegaly, and no cervical lymphadenopathy.   Lungs:  normal respiratory effort, no intercostal retractions, no accessory muscle use, normal breath sounds, and no wheezes.   Heart:  normal rate, regular rhythm, no murmur, and no gallop.   Extremities:  no edema Psych:  normally interactive, good eye contact, and dysphoric affect.     Impression & Recommendations:  Problem # 1:  HYPONATREMIA (ICD-276.1) Assessment Deteriorated  worsened again requiring Rx delerium may have been partially due to this on  fluid restriction and salt will recheck  Orders: Venipuncture (16109) TLB-Renal Function Panel (80069-RENAL)  Problem # 2:  CONSTIPATION, CHRONIC (ICD-564.09) Assessment: Deteriorated worse without two times a day miralax will consider going back to two times a day with less water may need to try sorbitol  The following medications were removed from the medication list:    Dulcolax Stool Softener 100 Mg Caps (Docusate sodium) .Marland Kitchen... 2 by mouth two times a day His updated medication list for this problem includes:    Miralax Powd (Polyethylene glycol 3350) .Marland KitchenMarland KitchenMarland KitchenMarland Kitchen 17 grams in 8 oz of water 1-2 x daily  Problem # 3:  RHEUMATOID ARTHRITIS (ICD-714.0) Assessment: Deteriorated needs to restart the methotrexate  His updated medication list for this problem includes:    Methotrexate 2.5 Mg Tabs (Methotrexate sodium) .Marland Kitchen... 9 tablets weekly for rheumatoid arthritis- hold until 12/07/10    Adult Aspirin Ec Low Strength 81 Mg Tbec (Aspirin) .Marland Kitchen... Take 1 tablet by mouth once a day    Prednisone 1 Mg Tabs (Prednisone) .Marland Kitchen... Take as directed  Problem # 4:  HYPERTENSION (ICD-401.9) Assessment: Comment Only had cut back meds due to low blood pressure by home nurse then 118/65 yesterday will stop the lasix  The following medications were removed from the medication list:    Lasix 40 Mg Tabs (Furosemide) .Marland Kitchen... Take 1 tablet by mouth once daily  hold    Metoprolol Tartrate 50 Mg Tabs (Metoprolol tartrate) .Marland Kitchen... Take 1 tablet by mouth once daily  hold His updated medication list for this problem includes:    Metoprolol Succinate 50 Mg Xr24h-tab (Metoprolol succinate) .Marland Kitchen... 1 tab daily for high blood pressure  BP today: 118/68 Prior BP: 120/78 (12/03/2010)  Labs Reviewed: K+: 4.5 (12/02/2010) Creat: : 0.7 (12/02/2010)   Chol: 172 (12/23/2009)   HDL: 71.50 (12/23/2009)   LDL: 88 (12/23/2009)   TG: 62.0 (12/23/2009)  Problem # 5:  NEUROGENIC BLADDER (ZHY-865.78) Assessment: Unchanged still with  Foley  Complete Medication List: 1)  Xolair 150 Mg Solr (Omalizumab) .... 375 mg every two weeks subcutaneously 2)  Methotrexate 2.5 Mg Tabs (Methotrexate sodium) .... 9 tablets weekly for rheumatoid arthritis- hold until 12/07/10 3)  Metoprolol Succinate 50 Mg Xr24h-tab (Metoprolol succinate) .Marland Kitchen.. 1 tab daily for high blood pressure 4)  Omeprazole 20 Mg Tbec (Omeprazole) .... Take 1 by mouth once daily 5)  Pravastatin Sodium 20 Mg Tabs (Pravastatin sodium) .Marland Kitchen.. 1 tab daily for high cholesterol 6)  Hydroxychloroquine Sulfate 200 Mg Tabs (Hydroxychloroquine sulfate) .Marland Kitchen.. 1 tab by mouth two times a day for rheumatoid arthritis 7)  Gabapentin 300 Mg Caps (Gabapentin) .Marland Kitchen.. 1 tab by mouth three times a day for neuropathy 8)  Adult Aspirin Ec Low Strength 81 Mg Tbec (Aspirin) .... Take 1 tablet by mouth once a day 9)  Multivitamins Tabs (Multiple vitamin) .... Take 1 tablet by mouth once a day 10)  Miralax Powd (Polyethylene glycol 3350) .Marland KitchenMarland KitchenMarland Kitchen 17 grams in 8 oz of water 1-2 x daily 11)  Prednisone 1 Mg Tabs (Prednisone) .... Take as directed 12)  Symbicort 160-4.5 Mcg/act Aero (Budesonide-formoterol fumarate) .... Two puffs two times a day 13)  Ipratropium-albuterol 0.5-2.5 (3) Mg/42ml Soln (Ipratropium-albuterol) .... One vial nebulized four times per day as needed 14)  Klor-con M20 20 Meq Cr-tabs (Potassium chloride crys cr) .... Take 1 tablet by mouth once daily 15)  Nacl Tablets 1 Gm  .... Take 1 tablet by mouth three times a day  Patient Instructions: 1)  Please stop the lasix for now 2)  We will call with instructions about the miralax when the blood work comes back 3)  Please schedule a follow-up appointment in 2 weeks.    Orders Added: 1)  Est. Patient Level IV [46962] 2)  Venipuncture [95284] 3)  TLB-Renal Function Panel [80069-RENAL]    Current Allergies (reviewed today): ! ALTACE ! ZOCOR ! CELEXA ! ATIVAN ! PAXIL ! VESICARE (SOLIFENACIN SUCCINATE)

## 2010-12-30 NOTE — Letter (Signed)
Summary: [facility]  [facility]   Imported By: Lester Willows 12/21/2010 10:37:10  _____________________________________________________________________  External Attachment:    Type:   Image     Comment:   External Document

## 2010-12-30 NOTE — Assessment & Plan Note (Signed)
Summary: xoalir/mhh   Nurse Visit   Allergies: 1)  ! Altace 2)  ! Zocor 3)  ! Celexa 4)  ! Ativan 5)  ! Paxil  Medication Administration  Injection # 1:    Medication: Xolair (omalizumab) 150mg     Diagnosis: EXTRINSIC ASTHMA, UNSPECIFIED (ICD-493.00)    Route: SQ    Site: R deltoid    Exp Date: 11/2013    Lot #: [id number]    Mfr: Genetech    Comments: 1.0 ML X 2 IN RIGHT AND 1.0 ML IN LEFT 375MG  CHARGED J2357 AND 96295    Patient tolerated injection without complications    Given by: SUSANNE FORD IN [facility]  Orders Added: 1)  Xolair (omalizumab) 150mg  [J2357] 2)  Administration xolair injection [28413]   Medication Administration  Injection # 1:    Medication: Xolair (omalizumab) 150mg     Diagnosis: EXTRINSIC ASTHMA, UNSPECIFIED (ICD-493.00)    Route: SQ    Site: R deltoid    Exp Date: 11/2013    Lot #: [id number]    Mfr: Genetech    Comments: 1.0 ML X 2 IN RIGHT AND 1.0 ML IN LEFT 375MG  CHARGED J2357 AND 27253    Patient tolerated injection without complications    Given by: SUSANNE FORD IN [facility]  Orders Added: 1)  Xolair (omalizumab) 150mg  [J2357] 2)  Administration xolair injection [66440]

## 2010-12-30 NOTE — Progress Notes (Signed)
Summary: regarding vesicare  Phone Note From Other Clinic Call back at [phone]   Caller: Elnita Maxwell, nurse with [facility]  [phone] Summary of Call: Home health nurse called to report that pt is on vesicare and that interacts with potassium.  She says that since he now has a foley cath he doesnt need the vesicare, but he is having bladder spasms so she is asking if ditropan, or something similar, can be called to [facility] [address].  Please let her know Initial call taken by: Lowella Petties CMA, AAMA,  December 01, 2010 4:46 PM  Follow-up for Phone Call        must check with Dr Achilles Dunk the urologist about these orders.  We are not managing the bladder issues Follow-up by: Cindee Salt MD,  December 01, 2010 9:32 PM  Additional Follow-up for Phone Call Additional follow up Details #1::        spoke with Elnita Maxwell, nurse with [facility]  and advised results, she states pt has appt with Dr.Cope on Friday, she will advise him to speak with him about the bladder spasms.  Additional Follow-up by: Mervin Hack CMA [name]),  December 02, 2010 9:33 AM

## 2010-12-31 ENCOUNTER — Encounter: Payer: Self-pay | Admitting: Pulmonary Disease

## 2010-12-31 ENCOUNTER — Ambulatory Visit (INDEPENDENT_AMBULATORY_CARE_PROVIDER_SITE_OTHER): Payer: Medicare PPO

## 2010-12-31 DIAGNOSIS — J45909 Unspecified asthma, uncomplicated: Secondary | ICD-10-CM

## 2011-01-05 ENCOUNTER — Encounter: Payer: Self-pay | Admitting: Internal Medicine

## 2011-01-05 NOTE — Letter (Signed)
Summary: Smith Robert MD/[facility]  Smith Robert MD/[facility]   Imported By: Lester [place] 12/27/2010 09:59:03  _____________________________________________________________________  External Attachment:    Type:   Image     Comment:   External Document  Appended Document: Smith Robert MD/[facility] Foley changed no new changes recheck 3 months will consider another trial of intermittent catheterization when patient is ready

## 2011-01-06 ENCOUNTER — Encounter: Payer: Self-pay | Admitting: Internal Medicine

## 2011-01-06 DIAGNOSIS — J329 Chronic sinusitis, unspecified: Secondary | ICD-10-CM | POA: Insufficient documentation

## 2011-01-06 DIAGNOSIS — K5792 Diverticulitis of intestine, part unspecified, without perforation or abscess without bleeding: Secondary | ICD-10-CM | POA: Insufficient documentation

## 2011-01-13 ENCOUNTER — Encounter: Payer: Self-pay | Admitting: Pulmonary Disease

## 2011-01-13 ENCOUNTER — Encounter: Payer: Self-pay | Admitting: Internal Medicine

## 2011-01-13 ENCOUNTER — Ambulatory Visit (INDEPENDENT_AMBULATORY_CARE_PROVIDER_SITE_OTHER): Payer: Medicare PPO

## 2011-01-13 DIAGNOSIS — J301 Allergic rhinitis due to pollen: Secondary | ICD-10-CM

## 2011-01-13 DIAGNOSIS — J45909 Unspecified asthma, uncomplicated: Secondary | ICD-10-CM

## 2011-01-13 NOTE — Miscellaneous (Signed)
Summary: Orders Update   Clinical Lists Changes  Orders: Added new Service order of Xolair (omalizumab) 150mg  (E4540) - Signed Added new Service order of Administration xolair injection [phone]) - Signed      Medication Administration  Injection # 1:    Medication: Xolair (omalizumab) 150mg     Diagnosis: EXTRINSIC ASTHMA, UNSPECIFIED (ICD-493.00)    Route: SQ    Site: R deltoid    Exp Date: 11/2013    Lot #: [id number]    Mfr: Salome Spotted    Comments: 1.0 ML X 2 IN RIGHT ARM AND 1.0 ML IN LEFT ARM CHARGED [id number] AND 56213    Given by: Babette Relic SCOTT IN [facility]  Orders Added: 1)  Xolair (omalizumab) 150mg  [J2357] 2)  Administration xolair injection [08657]

## 2011-01-13 NOTE — Miscellaneous (Signed)
Summary: [facility] Report  [facility]   Imported By: Kassie Mends 01/03/2011 08:37:44  _____________________________________________________________________  External Attachment:    Type:   Image     Comment:   External Document

## 2011-01-14 ENCOUNTER — Ambulatory Visit: Payer: Medicare PPO

## 2011-01-17 ENCOUNTER — Telehealth: Payer: Self-pay | Admitting: Internal Medicine

## 2011-01-19 NOTE — Assessment & Plan Note (Signed)
Summary: xolair/mhh   Nurse Visit   Allergies: 1)  ! Altace 2)  ! Zocor 3)  ! Celexa 4)  ! Ativan 5)  ! Paxil 6)  ! Vesicare (Solifenacin Succinate)  Medication Administration  Injection # 1:    Medication: Xolair (omalizumab) 150mg     Diagnosis: EXTRINSIC ASTHMA, UNSPECIFIED (ICD-493.00)    Route: SQ    Site: L deltoid    Exp Date: 11/2013    Lot #: [id number]    Mfr: Salome Spotted    Comments: 1.0 ML X 2 IN LEFT ARM AND 1.0 ML IN RIGHT ARM 375 MG CHARGED 96401 AND  J2357    Given by: TAMMY SCOTT IN [facility]  Orders Added: 1)  Xolair (omalizumab) 150mg  [J2357] 2)  Administration xolair injection [04540]   Medication Administration  Injection # 1:    Medication: Xolair (omalizumab) 150mg     Diagnosis: EXTRINSIC ASTHMA, UNSPECIFIED (ICD-493.00)    Route: SQ    Site: L deltoid    Exp Date: 11/2013    Lot #: [id number]    Mfr: Salome Spotted    Comments: 1.0 ML X 2 IN LEFT ARM AND 1.0 ML IN RIGHT ARM 375 MG CHARGED 96401 AND  J2357    Given by: TAMMY SCOTT IN [facility]  Orders Added: 1)  Xolair (omalizumab) 150mg  [J2357] 2)  Administration xolair injection [47829]

## 2011-01-25 NOTE — Letter (Signed)
Summary: [facility]   [facility]   Imported By: Kassie Mends 01/18/2011 08:26:26  _____________________________________________________________________  External Attachment:    Type:   Image     Comment:   External Document  Appended Document: [facility] Rhe[facility]  left knee injected checking MTX labs

## 2011-01-25 NOTE — Progress Notes (Signed)
Summary: wants sleeping pills / low BP   Phone Note Call from Patient Call back at Home Phone [phone]   Caller: Patient Call For: Cindee Salt MD Summary of Call: Patient is asking if he could go back on sleeping pills, he was taken off of them by Dr. Cherylann Ratel, but he says that Dr. Cherylann Ratel told him it would be fine to start taking them again. He also has some concern because his blood pressure has been low. The last time it was checked it was 100/52, but he says that it has been lower than that at times. He is asking if he should change any of his meds. Initial call taken by: Melody Comas,  January 17, 2011 2:37 PM  Follow-up for Phone Call        I would rather him increase the trazodone dose to 150mg  or even 200mg  then have him go back on the temazepam If he needs new RX, can change to trazodone 100mg  #60 x 3 1-2 at bedtime to help sleep  no meds lowering his BP now so just have to monitor Follow-up by: Cindee Salt MD,  January 17, 2011 5:10 PM  Additional Follow-up for Phone Call Additional follow up Details #1::        Patient notified. Rx called in to pharmacy.  Additional Follow-up by: Melody Comas,  January 18, 2011 9:25 AM    New/Updated Medications: TRAZODONE HCL 100 MG TABS (TRAZODONE HCL) 1-2 at bedtime to help sleep Prescriptions: TRAZODONE HCL 100 MG TABS (TRAZODONE HCL) 1-2 at bedtime to help sleep  #60 x 3   Entered by:   Melody Comas   Authorized by:   Cindee Salt MD   Signed by:   Melody Comas on 01/18/2011   Method used:   Telephoned to ...       [facility] (retail)       [address]       [place], [place]    [place]       Ph: [phone]       Fax: [phone]   RxID:   [id number]   Prior Medications: IPRATROPIUM-ALBUTEROL 0.5-2.5 (3) MG/3ML SOLN (IPRATROPIUM-ALBUTEROL) one vial nebulized four times per day as needed SYMBICORT 160-4.5 MCG/ACT AERO (BUDESONIDE-FORMOTEROL FUMARATE) two puffs two  times a day PREDNISONE 1 MG TABS (PREDNISONE) Take as directed FUROSEMIDE 20 MG TABS (FUROSEMIDE) 1 tab daily XOLAIR 150 MG SOLR (OMALIZUMAB) 375 mg every two weeks subcutaneously METHOTREXATE 2.5 MG TABS (METHOTREXATE SODIUM) 9 tablets weekly for rheumatoid arthritis- HOLD until 12/07/10 METOPROLOL SUCCINATE 50 MG XR24H-TAB (METOPROLOL SUCCINATE) 1 tab daily for high blood pressure OMEPRAZOLE 20 MG TBEC (OMEPRAZOLE) take 1 by mouth once daily PRAVASTATIN SODIUM 20 MG TABS (PRAVASTATIN SODIUM) 1 tab daily for high cholesterol HYDROXYCHLOROQUINE SULFATE 200 MG TABS (HYDROXYCHLOROQUINE SULFATE) 1 tab by mouth two times a day for rheumatoid arthritis GABAPENTIN 300 MG CAPS (GABAPENTIN) 1 tab by mouth three times a day for neuropathy ADULT ASPIRIN EC LOW STRENGTH 81 MG  TBEC (ASPIRIN) Take 1 tablet by mouth once a day MULTIVITAMINS   TABS (MULTIPLE VITAMIN) Take 1 tablet by mouth once a day MIRALAX  POWD (POLYETHYLENE GLYCOL 3350) 17 grams in 8 oz of water 1-2 x daily NACL TABLETS 1 GM () take 1 tablet by mouth three times a day Current Allergies: ! ALTACE ! ZOCOR ! CELEXA ! ATIVAN ! PAXIL ! VESICARE (SOLIFENACIN SUCCINATE)

## 2011-01-27 ENCOUNTER — Ambulatory Visit (INDEPENDENT_AMBULATORY_CARE_PROVIDER_SITE_OTHER): Payer: Medicare PPO

## 2011-01-27 ENCOUNTER — Encounter: Payer: Self-pay | Admitting: Pulmonary Disease

## 2011-01-27 DIAGNOSIS — J45909 Unspecified asthma, uncomplicated: Secondary | ICD-10-CM

## 2011-02-03 NOTE — Assessment & Plan Note (Signed)
Summary: xolair/mhh   Nurse Visit   Allergies: 1)  ! Altace 2)  ! Zocor 3)  ! Celexa 4)  ! Ativan 5)  ! Paxil 6)  ! Vesicare (Solifenacin Succinate)  Medication Administration  Injection # 1:    Medication: Xolair (omalizumab) 150mg     Diagnosis: EXTRINSIC ASTHMA, UNSPECIFIED (ICD-493.00)    Route: SQ    Site: R deltoid    Exp Date: 01/2014    Lot #: [id number]    Mfr: Salome Spotted    Comments: 1.0 ML X 2 IN RIGHT ARM AND  1.0 ML IN LEFT ARM CHARGED [id number] AND  96401/CB    Given by: Drucie Opitz IN [facility]  Orders Added: 1)  Xolair (omalizumab) 150mg  [J2357] 2)  Administration xolair injection [04540]   Medication Administration  Injection # 1:    Medication: Xolair (omalizumab) 150mg     Diagnosis: EXTRINSIC ASTHMA, UNSPECIFIED (ICD-493.00)    Route: SQ    Site: R deltoid    Exp Date: 01/2014    Lot #: [id number]    Mfr: Salome Spotted    Comments: 1.0 ML X 2 IN RIGHT ARM AND  1.0 ML IN LEFT ARM CHARGED [id number] AND  96401/CB    Given by: Lynnae Sandhoff FORD IN [facility]  Orders Added: 1)  Xolair (omalizumab) 150mg  [J2357] 2)  Administration xolair injection [47829]

## 2011-02-03 NOTE — Consult Note (Signed)
Summary: Mady Haagensen MD  Mady Haagensen MD   Imported By: Sherian Rein 01/24/2011 09:56:52  _____________________________________________________________________  External Attachment:    Type:   Image     Comment:   External Document  Appended Document: Munsoor Lateef MD relaxed fluid restriction to 1500cc per day

## 2011-02-10 ENCOUNTER — Encounter: Payer: Self-pay | Admitting: Pulmonary Disease

## 2011-02-10 ENCOUNTER — Ambulatory Visit (INDEPENDENT_AMBULATORY_CARE_PROVIDER_SITE_OTHER): Payer: Medicare PPO

## 2011-02-10 DIAGNOSIS — J45909 Unspecified asthma, uncomplicated: Secondary | ICD-10-CM

## 2011-02-15 NOTE — Assessment & Plan Note (Signed)
Summary: XOLIAR/CB   Nurse Visit   Allergies: 1)  ! Altace 2)  ! Zocor 3)  ! Celexa 4)  ! Ativan 5)  ! Paxil 6)  ! Vesicare (Solifenacin Succinate)  Medication Administration  Injection # 1:    Medication: Xolair (omalizumab) 150mg     Diagnosis: EXTRINSIC ASTHMA, UNSPECIFIED (ICD-493.00)    Route: SQ    Site: L deltoid    Exp Date: 02/2014    Lot #: [id number]    Mfr: Salome Spotted    Comments: 1.0 ml in left arm x 2 and 1.36ml in right arm 375 mg charged j2357 and  03474    Given by: TAMMY SCOTT IN [facility]  Orders Added: 1)  Xolair (omalizumab) 150mg  [J2357] 2)  Administration xolair injection [25956]   Medication Administration  Injection # 1:    Medication: Xolair (omalizumab) 150mg     Diagnosis: EXTRINSIC ASTHMA, UNSPECIFIED (ICD-493.00)    Route: SQ    Site: L deltoid    Exp Date: 02/2014    Lot #: [id number]    Mfr: Salome Spotted    Comments: 1.0 ml in left arm x 2 and 1.8ml in right arm 375 mg charged j2357 and  33295    Given by: TAMMY SCOTT IN [facility]  Orders Added: 1)  Xolair (omalizumab) 150mg  [J2357] 2)  Administration xolair injection [18841]

## 2011-02-23 ENCOUNTER — Ambulatory Visit: Payer: Self-pay | Admitting: Internal Medicine

## 2011-02-24 ENCOUNTER — Ambulatory Visit (INDEPENDENT_AMBULATORY_CARE_PROVIDER_SITE_OTHER): Payer: Medicare PPO

## 2011-02-24 DIAGNOSIS — J45909 Unspecified asthma, uncomplicated: Secondary | ICD-10-CM

## 2011-02-25 ENCOUNTER — Ambulatory Visit (INDEPENDENT_AMBULATORY_CARE_PROVIDER_SITE_OTHER): Payer: Medicare PPO | Admitting: Internal Medicine

## 2011-02-25 ENCOUNTER — Encounter: Payer: Self-pay | Admitting: Internal Medicine

## 2011-02-25 VITALS — BP 136/69 | HR 68 | Temp 98.0°F | Ht 71.0 in | Wt 205.0 lb

## 2011-02-25 DIAGNOSIS — M1710 Unilateral primary osteoarthritis, unspecified knee: Secondary | ICD-10-CM

## 2011-02-25 DIAGNOSIS — I10 Essential (primary) hypertension: Secondary | ICD-10-CM

## 2011-02-25 DIAGNOSIS — N319 Neuromuscular dysfunction of bladder, unspecified: Secondary | ICD-10-CM

## 2011-02-25 DIAGNOSIS — M069 Rheumatoid arthritis, unspecified: Secondary | ICD-10-CM

## 2011-02-25 DIAGNOSIS — E871 Hypo-osmolality and hyponatremia: Secondary | ICD-10-CM

## 2011-02-25 DIAGNOSIS — C61 Malignant neoplasm of prostate: Secondary | ICD-10-CM

## 2011-02-25 DIAGNOSIS — F329 Major depressive disorder, single episode, unspecified: Secondary | ICD-10-CM

## 2011-02-25 DIAGNOSIS — J45909 Unspecified asthma, uncomplicated: Secondary | ICD-10-CM

## 2011-02-25 DIAGNOSIS — F411 Generalized anxiety disorder: Secondary | ICD-10-CM

## 2011-02-25 LAB — BASIC METABOLIC PANEL
BUN: 15 mg/dL (ref 6–23)
Chloride: 100 mEq/L (ref 96–112)
GFR: 132.04 mL/min (ref >60.00)
Potassium: 4.5 mEq/L (ref 3.5–5.1)
Sodium: 137 mEq/L (ref 135–145)

## 2011-02-25 LAB — PSA: PSA: 0.03 ng/mL — ABNORMAL LOW (ref 0.10–4.00)

## 2011-02-25 MED ORDER — OMALIZUMAB 150 MG ~~LOC~~ SOLR
375.0000 mg | Freq: Once | SUBCUTANEOUS | Status: AC
  Administered 2011-02-24: 375 mg via SUBCUTANEOUS

## 2011-02-25 MED ORDER — ALPRAZOLAM 0.5 MG PO TABS: 0.2500 mg | ORAL_TABLET | Freq: Three times a day (TID) | ORAL | Status: DC | PRN

## 2011-02-25 NOTE — Progress Notes (Signed)
Subjective:    Patient ID: [name], male    DOB: May 16, 1934, 77 y.o.   MRN: [id number]  HPI Has been doing better overall Did have another visit with Dr Vito Backers for BMP Still on salt tablets and diuretic (lasix) This is inconvenient due to urinary frequency----he gets spasms at times from increased bladder filling Dr Achilles Dunk wanted him back on the vesicare--this has helped since he restarted No mental changes on this but does get dry mouth  Finally sleeping better with the trazodone Still uses the xanax some nights also Uses big bag on catheter for night--leg bag for days  Arthritis remains bad Still on the MTX and hydroxychlorquine. Almost off the prednisone Uses advil daily also  Asthma has been very quiet No cough or SOB Expects to stop the symbicort soon---hasn't used it in several weeks Has not noted any difference  Past Medical History  Diagnosis Date  . CAD (coronary artery disease)   . Neurogenic bladder   . Asthma   . Chronic sinusitis   . Diverticulitis   . GERD (gastroesophageal reflux disease)   . Arthritis     rheumatoid  . Cancer     Prostate  . IBS (irritable bowel syndrome)   . Anxiety   . Neurodermatitis   . ED (erectile dysfunction)   . OSA (obstructive sleep apnea)   . Allergy   . Hypertension   . Depression   . Hx of adenomatous colonic polyps   . History of SIADH   . Hyponatremia     Past Surgical History  Procedure Date  . Prostate surgery     PROSTATECTOMY  . Knee surgery     ARTHROSCOPY LEFT  . Nasal sinus surgery 2009    DEVIATED SEPTUM AND POLYPS  . Urethral stricture dilatation 02-2010    Dr.Cope    Family History  Problem Relation Age of Onset  . Heart disease Mother 33    heart failure  . Pneumonia Father 40    pnemonia    History   Social History  . Marital Status: Married    Spouse Name: N/A    Number of Children: 2  . Years of Education: N/A   Occupational History  . Radio station owner     retired    Social History Main Topics  . Smoking status: Never Smoker   . Smokeless tobacco: Not on file  . Alcohol Use: Yes  . Drug Use: No  . Sexually Active:    Other Topics Concern  . Not on file   Social History Narrative  . No narrative on file      Review of Systems Weight is up 10# Some mild edema but it doesn't seem to be fluid Appetite is much better Does have wine occasionally May want to consider TKR again sometime soon Bowels are fine on just miralax once a day    Objective:   Physical Exam  Constitutional: He appears well-developed and well-nourished. No distress.  Neck: Normal range of motion. Neck supple. No thyromegaly present.  Cardiovascular: Normal rate, regular rhythm and normal heart sounds.  Exam reveals no gallop.   No murmur heard. Pulmonary/Chest: Effort normal. He has no wheezes. He has no rales.  Abdominal: Soft. He exhibits no mass. There is no tenderness.  Musculoskeletal:       Trace - 1+ edema Thickened knees with decreased ROM  Lymphadenopathy:    He has no cervical adenopathy.  Psychiatric:  Anxiety is better No depression          Assessment & Plan:

## 2011-02-28 ENCOUNTER — Telehealth: Payer: Self-pay

## 2011-02-28 NOTE — Telephone Encounter (Signed)
Message copied by Kyung Rudd on Mon Feb 28, 2011  1:46 PM ------      Message from: Tillman Abide      Created: Sat Feb 26, 2011  9:15 AM       Please call      PSA remains almost undetectable at 0.03. THis is where it has been over the past couple of years usually. Very reassuring      Kidney tests are normal and sodium remains normal at 137            Copy to Dr Mady Haagensen

## 2011-02-28 NOTE — Telephone Encounter (Signed)
Pt aware.

## 2011-03-07 ENCOUNTER — Other Ambulatory Visit: Payer: Self-pay | Admitting: *Deleted

## 2011-03-07 MED ORDER — SODIUM CHLORIDE 1 G PO TABS: 1.0000 g | ORAL_TABLET | Freq: Three times a day (TID) | ORAL | Status: DC

## 2011-03-10 ENCOUNTER — Ambulatory Visit: Payer: Medicare PPO

## 2011-03-11 ENCOUNTER — Ambulatory Visit (INDEPENDENT_AMBULATORY_CARE_PROVIDER_SITE_OTHER): Payer: Medicare PPO

## 2011-03-11 DIAGNOSIS — J45909 Unspecified asthma, uncomplicated: Secondary | ICD-10-CM

## 2011-03-11 MED ORDER — OMALIZUMAB 150 MG ~~LOC~~ SOLR
375.0000 mg | Freq: Once | SUBCUTANEOUS | Status: AC
  Administered 2011-03-11: 375 mg via SUBCUTANEOUS

## 2011-03-24 ENCOUNTER — Ambulatory Visit (INDEPENDENT_AMBULATORY_CARE_PROVIDER_SITE_OTHER): Payer: Medicare PPO

## 2011-03-24 DIAGNOSIS — J45909 Unspecified asthma, uncomplicated: Secondary | ICD-10-CM

## 2011-03-25 MED ORDER — OMALIZUMAB 150 MG ~~LOC~~ SOLR
375.0000 mg | Freq: Once | SUBCUTANEOUS | Status: AC
  Administered 2011-03-25: 375 mg via SUBCUTANEOUS

## 2011-03-28 ENCOUNTER — Ambulatory Visit: Payer: Medicare PPO | Admitting: Internal Medicine

## 2011-03-30 DIAGNOSIS — J45909 Unspecified asthma, uncomplicated: Secondary | ICD-10-CM

## 2011-04-07 ENCOUNTER — Ambulatory Visit: Payer: Medicare PPO

## 2011-04-11 ENCOUNTER — Ambulatory Visit (INDEPENDENT_AMBULATORY_CARE_PROVIDER_SITE_OTHER): Payer: Medicare PPO

## 2011-04-11 ENCOUNTER — Other Ambulatory Visit: Payer: Self-pay | Admitting: Internal Medicine

## 2011-04-11 DIAGNOSIS — J45909 Unspecified asthma, uncomplicated: Secondary | ICD-10-CM

## 2011-04-11 MED ORDER — OMALIZUMAB 150 MG ~~LOC~~ SOLR
375.0000 mg | Freq: Once | SUBCUTANEOUS | Status: AC
  Administered 2011-04-11: 375 mg via SUBCUTANEOUS

## 2011-04-12 NOTE — Procedures (Signed)
[name], HINDERLITER NO.:  [id number]   MEDICAL RECORD NO.:  [id number]          PATIENT TYPE:  OUT   LOCATION:  SLEEP CENTER                 FACILITY:  [facility]   PHYSICIAN:  Coralyn Helling, MD        DATE OF BIRTH:  [birth year]   DATE OF STUDY:  05/19/2009                            NOCTURNAL POLYSOMNOGRAM   REFERRING PHYSICIAN:   REFERRING PHYSICIAN:  Coralyn Helling, MD   INDICATION FOR STUDY:  [name] is a 77 year old male, who had a  overnight polysomnogram done on May 09, 2009 and this showed a evidence  for severe obstructive sleep apnea with an apnea-hypopnea index of 68.8.  He is referred back to sleep lab for a CPAP titration study.   Height is 5 feet 11 inches, weight is 236 pounds.  BMI is 33.  Neck size  is 18 inches.   EPWORTH SLEEPINESS SCORE:  10.   MEDICATIONS:  Ipratropium, albuterol, Singulair, Symbicort, Combivent,  Zyrtec, Allegra, Plaquenil, aspirin, Pravachol, alprazolam,  multivitamin, Prilosec, Pepcid, hydrocodone, Toprol, Cozaar, and  prednisone.  The patient took up temazepam on the night of the study.   SLEEP ARCHITECTURE:  Total recording time was 359 minutes.  Total sleep  time was 327 minutes.  Sleep efficiency was 91%.  Sleep latency was 0.5  minutes.  REM latency was 87 minutes.  The study was notable for lack of  slow wave sleep.  The patient slept predominately in the supine  position.   RESPIRATORY DATA:  The average respiratory rate was 24.  The patient was  initiated on CPAP of 5 cm of water and this was titrated up to pressure  of 16 cm of water.  However, with CPAP, he continued to have respiratory  events with the oxygen desaturation.  As a result, he was changed to  BiPAP therapy starting at 18/14 and titrated up to 24/19.  At a BiPAP  pressure setting of 23/19 his apnea-hypopnea index was reduced to 0 and  at this pressure, he was observed in REM sleep and supine sleep.   OXYGEN DATA:  The baseline oxygenation was  98%.  The oxygen saturation  nadir was 76%.  With a BiPAP pressure setting of 23/19, the oxygen  saturation nadir was 92% and the study was conducted without the use of  supplemental oxygen.   CARDIAC DATA:  The average heart rate was 70 and the rhythm strip showed  normal sinus rhythm with occasional PVCs.   MOVEMENT-PARASOMNIA:  The patient had 1 restroom trip and the periodic  limb movement index was 22.   IMPRESSIONS-RECOMMENDATIONS:  The patient had adequate control of his  sleep disordered breathing with BiPAP setting of 23/19.  At this  pressure setting, his oxygenation stabilized and he was observed in REM  sleep and supine sleep.   He did have a increase in his periodic limb movement index and clinical  correlation would be necessary to determine the significance of this.      Coralyn Helling, MD  Diplomat, American Board of Sleep Medicine  Electronically Signed     VS/MEDQ  D:  06/01/2009 12:23:35  T:  06/02/2009 00:03:37  Job:  [id number]

## 2011-04-12 NOTE — Procedures (Signed)
NAMEDOCTOR, SHEAHAN NO.:  [id number]   MEDICAL RECORD NO.:  [id number]          PATIENT TYPE:  OUT   LOCATION:  SLEEP CENTER                 FACILITY:  [facility]   PHYSICIAN:  Coralyn Helling, MD        DATE OF BIRTH:  January 18, 1934   DATE OF STUDY:  05/09/2009                            NOCTURNAL POLYSOMNOGRAM   REFERRING PHYSICIAN:  Coralyn Helling, MD   FACILITY:  [facility].   INDICATIONS:  Ms. Slivinski is a 76 year old male who has a history of  asthma, heart disease, rheumatoid arthritis, and hypertension.  He has  symptoms of sleep disruption and excessive daytime sleepiness.  He  referred to the Sleep Lab for evaluation of hypersomnia with obstructive  sleep apnea.   Height is 5 feet 11 inches, weight is 236 pounds.  BMI is 33.  Neck size  is 17 inches.   MEDICATIONS:  DuoNeb nebulizer, Singulair, Symbicort, Combivent, Zyrtec,  Allegra, aspirin, Plaquenil, temazepam, alprazolam, multivitamin,  Pravachol, Prilosec, Pepcid, hydrocodone, Toprol-XL and Cozaar.  The  patient used his DuoNeb nebulizer during the remainder of the study.   EPWORTH SCORE:  10.   SLEEP ARCHITECTURE:  Total recording time was 361 minutes.  Total sleep  time was 160 minutes.  Sleep efficiency was 44%.  Sleep latency was 9.5  minutes.  The study was notable for lack of slow wave sleep and REM  sleep.  The patient slept exclusively in the nonsupine position.  The  patient appeared to have difficulties with sleep initiation and sleep  maintenance due to respiratory events.   RESPIRATORY DATA:  The average respiratory rate is 22.  Loud snoring was  noted by the technician.  The overall apnea-hypopnea index was 68.8.  The events were exclusively obstructive in nature.   OXYGEN DATA:  The baseline oxygenation was 94%.  The oxygen saturation  nadir was 70%.  The patient spent a total of 45 minutes with an oxygen  saturation of 90% and 23 minutes with an oxygen saturation less than   88%.   CARDIAC DATA:  The average heart rate was 78 and the rhythm strip showed  normal sinus rhythm with PACs and PVCs.   MOVEMENT PARASOMNIA:  The periodic limb movement index was 0.4 and the  patient had 1 restroom trip.   IMPRESSION:  This study shows evidence for severe obstructive sleep  apnea.   In addition to diet, excise, and weight reduction, the patient should be  referred back to the Sleep Lab for a continuous positive airway pressure  titration study.      Coralyn Helling, MD  Diplomat, American Board of Sleep Medicine  Electronically Signed     VS/MEDQ  D:  05/12/2009 17:05:37  T:  05/13/2009 05:49:02  Job:  [id number]

## 2011-04-15 NOTE — Assessment & Plan Note (Signed)
[facility]                           GASTROENTEROLOGY OFFICE NOTE   NAME:Forget, [name]                         MRN:          [id number]  DATE:09/18/2006                            DOB:          02-14-1934    CHIEF COMPLAINT:  Severe left abdominal pain.   HISTORY:  [name] is a pleasant 77 year old white male known to Dr. Russella Dar,  who has not been seen for a couple of years in the GI office.  He has  diagnoses of irritable bowel syndrome and diverticulosis.  He last underwent  colonoscopy November 2004, which did show sigmoid colon diverticulosis,  otherwise normal exam.  Patient also has history of prostate cancer and  underwent radiation therapy in the late 1980s.  He has been having problems  over the past several months with multiple different issues.  He had an  abnormal UA in April, which led to several scans, etc.  He was diagnosed  with radiation cystitis.  After that, he developed a prolonged episode of  strep throat and then right chest pain and discomfort, which eventually led  to a chest CT scan.  He said he became very anxious at this point and  worried about cancer, and all of his problems got worse.  He put himself on  a very restricted diet during these months, including a very low salt diet,  and now has even more severely limited his diet, and subsequently has lost  25 pounds.  Now, over the past 2 months, he has been having what he  describes as severe IBS symptoms.  This has been manifested mostly with  bloating, gas and left-sided abdominal pain as well as abnormal bowel  movements.  He says sometimes his stools are loose, sometimes they are  constipated, and he has had a lot of straining.  He has been seen by Dr.  Alphonsus Sias and was treated with a course of Augmentin for possible  diverticulitis.  This did seem to help his symptoms, however, after a week  or so off of the Augmentin, his symptoms worsened again.  He was seen back  by Dr. Alphonsus Sias and started back on another course of Augmentin, which he is  taking now.  He says that over the weekend his abdominal pain was so bad he  thought about going to the emergency room.  He describes it as a pressure-  type of sensation and a bloated-type of sensation in the left abdomen that  goes into his left flank.  After any p.o. intake he also develops rectal  pressure and urgency.  He says he has been eating less because every time he  eats he gets this pressure, which is quite uncomfortable.  He has not had  any fever or chills.  No nausea or vomiting.  He had been given a  prescription for Vicodin per Dr. Alphonsus Sias, for which he said was not helpful,  and he has not been taking it.   His last CT scan was done in May of 2007 of his abdomen and pelvis, and did  not show  any evidence of active diverticulitis and no other acute  intraabdominal findings.   CURRENT MEDICATIONS:  1. Hydro-Chlor 200 mg t.i.d.  2. Toprol XL 50 one half tablet daily.  3. Pravachol 20 p.o. q.d.  4. Aspirin 81 mg daily.  5. Flonase 2 squirts per nostril once daily.  6. Alprazolam 0.5 q.i.d. p.r.n.  7. Centrum Silver.  8. Vitamin E and B.  9. Selenium.  10.Folic acid.  11.Co-Q10.  12.Currently on a prescription of Augmentin 500 b.i.d.   ALLERGIES/INTOLERANCES:  CIPRO.   PHYSICAL EXAMINATION:  GENERAL:  Well-developed white male, in no acute  distress.  VITAL SIGNS:  Weight is 215.  Blood pressure 130/80.  Pulse is 62.  CARDIOVASCULAR:  Regular rate and rhythm with S1 and S2.  PULMONARY:  Clear to A&P.  ABDOMEN:  Soft.  Bowel sounds are active.  He is tender in the left upper  quadrant and left lower quadrant.  There is no palpable mass.  No guarding  or rebound.  RECTAL:  Exam is not done at this time.   IMPRESSION:  64. A 77 year old white male with persistent left-sided abdominal pain      associated with gas, bloating, rectal pressure and pain.  It is unclear      whether this is all  related to diverticulitis versus irritable bowel      syndrome or other process.  2. History of prostate cancer.  3. Weight loss, multifactorial and in part self-induced.   PLAN:  1. CT of the abdomen and pelvis, then schedule colonoscopy to complete      workup.  2. Complete current course of Augmentin.  3. Check CMET, CBC, lipase and TSH today.  4. Patient was encouraged to liberalize his diet and discontinue the      Ensure.  5. Trial of Align 1 p.o. q.d. times one month and Robinul Forte 1 p.o.      b.i.d. for spasm.  6. Patient should also be reevaluated by Dr. Alphonsus Sias regarding his chronic      anxiety, and was encouraged to make an appointment this week.      ______________________________  Mike Gip, PA-C    ______________________________  Venita Lick. Russella Dar, MD, Clementeen Graham     AE/MedQ  DD:  09/18/2006  DT:  09/19/2006  Job #:  [id number]

## 2011-04-22 ENCOUNTER — Ambulatory Visit: Payer: Medicare PPO

## 2011-04-26 ENCOUNTER — Ambulatory Visit (INDEPENDENT_AMBULATORY_CARE_PROVIDER_SITE_OTHER): Payer: Medicare PPO

## 2011-04-26 DIAGNOSIS — J45909 Unspecified asthma, uncomplicated: Secondary | ICD-10-CM

## 2011-04-27 MED ORDER — OMALIZUMAB 150 MG ~~LOC~~ SOLR
375.0000 mg | Freq: Once | SUBCUTANEOUS | Status: AC
  Administered 2011-04-27: 375 mg via SUBCUTANEOUS

## 2011-05-10 ENCOUNTER — Ambulatory Visit (INDEPENDENT_AMBULATORY_CARE_PROVIDER_SITE_OTHER): Payer: Medicare PPO

## 2011-05-10 DIAGNOSIS — J45909 Unspecified asthma, uncomplicated: Secondary | ICD-10-CM

## 2011-05-11 MED ORDER — OMALIZUMAB 150 MG ~~LOC~~ SOLR
375.0000 mg | Freq: Once | SUBCUTANEOUS | Status: AC
  Administered 2011-05-11: 375 mg via SUBCUTANEOUS

## 2011-05-19 ENCOUNTER — Encounter: Payer: Self-pay | Admitting: Cardiovascular Disease

## 2011-05-24 ENCOUNTER — Ambulatory Visit (INDEPENDENT_AMBULATORY_CARE_PROVIDER_SITE_OTHER): Payer: Medicare PPO

## 2011-05-24 DIAGNOSIS — J45909 Unspecified asthma, uncomplicated: Secondary | ICD-10-CM

## 2011-05-24 MED ORDER — OMALIZUMAB 150 MG ~~LOC~~ SOLR
300.0000 mg | Freq: Once | SUBCUTANEOUS | Status: AC
  Administered 2011-05-24: 300 mg via SUBCUTANEOUS

## 2011-05-25 ENCOUNTER — Ambulatory Visit: Payer: Medicare PPO

## 2011-05-31 ENCOUNTER — Telehealth: Payer: Self-pay | Admitting: *Deleted

## 2011-05-31 NOTE — Telephone Encounter (Signed)
Pt is scheduled for follow-up with Dr. Craige Cotta on 8/7/202 @ 12 pm.

## 2011-05-31 NOTE — Telephone Encounter (Signed)
Message copied by Gwynneth Albright on Tue May 31, 2011  4:38 PM ------      Message from: Coralyn Helling      Created: Thu May 26, 2011  5:40 PM       He is overdue for follow up with me.  Will have my nurse schedule him for follow up.            ----- Message -----         From: Varney Baas, MD         Sent: 05/25/2011   2:09 PM           To: Coralyn Helling, MD            Vineet,            I just got a reminder from [facility] about his CT scan showing some tiny apical nodules and asking for 6 month follow up.            Sounds benign---I will send copy of report and you can decide if this needs follow up            Rich

## 2011-06-02 ENCOUNTER — Other Ambulatory Visit: Payer: Self-pay | Admitting: Internal Medicine

## 2011-06-07 ENCOUNTER — Ambulatory Visit (INDEPENDENT_AMBULATORY_CARE_PROVIDER_SITE_OTHER): Payer: Medicare PPO

## 2011-06-07 DIAGNOSIS — J45909 Unspecified asthma, uncomplicated: Secondary | ICD-10-CM

## 2011-06-07 MED ORDER — OMALIZUMAB 150 MG ~~LOC~~ SOLR
375.0000 mg | Freq: Once | SUBCUTANEOUS | Status: AC
  Administered 2011-06-07: 375 mg via SUBCUTANEOUS

## 2011-06-13 ENCOUNTER — Ambulatory Visit (INDEPENDENT_AMBULATORY_CARE_PROVIDER_SITE_OTHER): Payer: Medicare PPO | Admitting: Internal Medicine

## 2011-06-13 ENCOUNTER — Encounter: Payer: Self-pay | Admitting: Internal Medicine

## 2011-06-13 DIAGNOSIS — I872 Venous insufficiency (chronic) (peripheral): Secondary | ICD-10-CM | POA: Insufficient documentation

## 2011-06-13 DIAGNOSIS — I8310 Varicose veins of unspecified lower extremity with inflammation: Secondary | ICD-10-CM

## 2011-06-13 NOTE — Progress Notes (Signed)
Subjective:    Patient ID: [name], male    DOB: 1934-03-02, 77 y.o.   MRN: [id number]  HPI Has continued on salt tablets and lasix Puts out a lot of urine after taking lasix--within several hours Chronic mild edema  Still interested in doing straight caths again Plans to start in August or September  Noted some redness and heat along lower left calf Seems some better now At first in right also---this appears to be resolved  No pain there No fever  Current Outpatient Prescriptions on File Prior to Visit  Medication Sig Dispense Refill  . aspirin 81 MG tablet Take 81 mg by mouth daily.        . furosemide (LASIX) 20 MG tablet Take 20 mg by mouth daily.        Marland Kitchen gabapentin (NEURONTIN) 300 MG capsule Take 300 mg by mouth 3 (three) times daily.        . hydroxychloroquine (PLAQUENIL) 200 MG tablet Take 200 mg by mouth 2 (two) times daily.        Marland Kitchen ipratropium-albuterol (DUONEB) 0.5-2.5 (3) MG/3ML SOLN Take 3 mLs by nebulization 4 (four) times daily as needed.        . methotrexate 2.5 MG tablet Take 22.5 mg by mouth once a week.       . metoprolol (TOPROL-XL) 50 MG 24 hr tablet TAKE ONE TABLET BY MOUTH EVERY DAY FOR HIGH BLOOD PRESSURE  90 tablet  3  . Multiple Vitamin (MULTIVITAMIN) tablet Take 1 tablet by mouth daily.        Marland Kitchen omalizumab (XOLAIR) 150 MG injection Inject 375 mg into the skin every 14 (fourteen) days.        Marland Kitchen omeprazole (PRILOSEC) 20 MG capsule Take 20 mg by mouth daily.        . polyethylene glycol (GLYCOLAX) packet Take 17 g by mouth daily.       . pravastatin (PRAVACHOL) 20 MG tablet TAKE ONE TABLET BY MOUTH EVERY DAY  30 tablet  11  . sodium chloride 1 G tablet Take 1 tablet (1 g total) by mouth 3 (three) times daily.  90 tablet  3  . solifenacin (VESICARE) 5 MG tablet Take 5 mg by mouth daily. For bladder spasms       . traZODone (DESYREL) 100 MG tablet TAKE 1-2 TABLETS BY MOUTH EVERY NIGHT AT BEDTIME AS NEEDED FOR SLEEP  60 tablet  0    Allergies    Allergen Reactions  . Ciprofloxacin   . Citalopram Hydrobromide   . Lorazepam   . Paroxetine   . Ramipril   . Simvastatin     Past Medical History  Diagnosis Date  . CAD (coronary artery disease)   . Neurogenic bladder   . Asthma   . Chronic sinusitis   . Diverticulitis   . GERD (gastroesophageal reflux disease)   . Arthritis     rheumatoid  . Cancer     Prostate  . IBS (irritable bowel syndrome)   . Anxiety   . Neurodermatitis   . ED (erectile dysfunction)   . OSA (obstructive sleep apnea)   . Allergy   . Hypertension   . Depression   . Hx of adenomatous colonic polyps   . History of SIADH   . Hyponatremia     Past Surgical History  Procedure Date  . Prostate surgery     PROSTATECTOMY  . Knee surgery     ARTHROSCOPY LEFT  . Nasal sinus surgery  2009    DEVIATED SEPTUM AND POLYPS  . Urethral stricture dilatation 02-2010    Dr.Cope    Family History  Problem Relation Age of Onset  . Heart disease Mother 52    heart failure  . Pneumonia Father 81    pnemonia    History   Social History  . Marital Status: Married    Spouse Name: N/A    Number of Children: 2  . Years of Education: N/A   Occupational History  . Radio station owner     retired   Social History Main Topics  . Smoking status: Never Smoker   . Smokeless tobacco: Not on file  . Alcohol Use: Yes  . Drug Use: No  . Sexually Active:    Other Topics Concern  . Not on file   Social History Narrative  . No narrative on file   Review of Systems Appetite is okay Weight up 3# since last visit     Objective:   Physical Exam  Constitutional: He appears well-developed and well-nourished.  Musculoskeletal:       Mild edema mostly in ankles  Skin:       Slight redness along medial left calf No warmth or tenderness Foot has some cool redness on left as well          Assessment & Plan:

## 2011-06-13 NOTE — Assessment & Plan Note (Signed)
Not sure why this looked worse this morning Clearly is better and no signs of infection Reassured No Rx needed

## 2011-06-14 ENCOUNTER — Ambulatory Visit: Payer: Medicare PPO | Admitting: Internal Medicine

## 2011-06-18 ENCOUNTER — Other Ambulatory Visit: Payer: Self-pay | Admitting: Pulmonary Disease

## 2011-06-21 ENCOUNTER — Ambulatory Visit (INDEPENDENT_AMBULATORY_CARE_PROVIDER_SITE_OTHER): Payer: Medicare PPO

## 2011-06-21 DIAGNOSIS — J45909 Unspecified asthma, uncomplicated: Secondary | ICD-10-CM

## 2011-06-22 MED ORDER — OMALIZUMAB 150 MG ~~LOC~~ SOLR
375.0000 mg | Freq: Once | SUBCUTANEOUS | Status: AC
  Administered 2011-06-22: 375 mg via SUBCUTANEOUS

## 2011-07-03 ENCOUNTER — Other Ambulatory Visit: Payer: Self-pay | Admitting: Internal Medicine

## 2011-07-04 ENCOUNTER — Other Ambulatory Visit: Payer: Self-pay | Admitting: Internal Medicine

## 2011-07-05 ENCOUNTER — Ambulatory Visit (INDEPENDENT_AMBULATORY_CARE_PROVIDER_SITE_OTHER): Payer: Medicare PPO | Admitting: Pulmonary Disease

## 2011-07-05 ENCOUNTER — Ambulatory Visit (INDEPENDENT_AMBULATORY_CARE_PROVIDER_SITE_OTHER): Payer: Medicare PPO

## 2011-07-05 ENCOUNTER — Encounter: Payer: Self-pay | Admitting: Pulmonary Disease

## 2011-07-05 DIAGNOSIS — J45909 Unspecified asthma, uncomplicated: Secondary | ICD-10-CM

## 2011-07-05 DIAGNOSIS — G4733 Obstructive sleep apnea (adult) (pediatric): Secondary | ICD-10-CM

## 2011-07-05 DIAGNOSIS — J984 Other disorders of lung: Secondary | ICD-10-CM

## 2011-07-05 DIAGNOSIS — J329 Chronic sinusitis, unspecified: Secondary | ICD-10-CM

## 2011-07-05 DIAGNOSIS — R911 Solitary pulmonary nodule: Secondary | ICD-10-CM

## 2011-07-05 NOTE — Assessment & Plan Note (Signed)
Advised him to restart nasal irrigation.

## 2011-07-05 NOTE — Patient Instructions (Signed)
Will schedule CT chest Follow up after CT chest completed

## 2011-07-05 NOTE — Telephone Encounter (Signed)
rx sent electronically. 

## 2011-07-05 NOTE — Assessment & Plan Note (Signed)
He was noted to have nodule on CT chest from December 2011.  Will arrange for CT chest w/o contrast to further assess.

## 2011-07-05 NOTE — Progress Notes (Signed)
  Subjective:    Patient ID: [name], male    DOB: 10/12/1934, 77 y.o.   MRN: [id number]  HPI Dr. Andee Poles, Dr. Saverio Danker, Dr. Assunta Gambles  77 yo male with asthma, allergic rhinitis on xolair.  He has continued with xolair therapy.  He ran out of symbicort about 5 months ago, and has not used since.  He has not noticed any difference with his breathing w/o symbicort.  He has not used his nebulizer in months.  It has been weeks since he had to use his ventolin.  He is not having wheeze, cough, sputum, chest pain, or chest congestion.  He has not been using his nasal irrigation, and has noticed that his sinus congestion has increased.  He has been off prednisone since March, and has been maintained on MTX for his rheumatoid arthritis.  He had CT chest from December 2011 at [facility] showing Rt upper lobe nodule.  He was hospitalized at that time for hyponatremia.  Review of Systems     Objective:   Physical Exam  BP 124/62  Pulse 62  Temp(Src) 97.7 F (36.5 C) (Oral)  Wt 209 lb 9.6 oz (95.074 kg)  SpO2 100%  General - Thin HEENT - no sinus tenderness, clear nasal drainage, no oral exudate, no LAN Cardiac - s1s2 regular, no murmur Chest - no wheeze/rales Abd - soft, non-tender Ext - no edema Neuro - normal strength, CN intact Psych - normal mood, behavior    Assessment & Plan:

## 2011-07-05 NOTE — Assessment & Plan Note (Signed)
He is reluctant to change xolair dose schedule while he is working on his bladder catheter issues.  Will re-address this at next visit.

## 2011-07-06 ENCOUNTER — Encounter: Payer: Self-pay | Admitting: Pulmonary Disease

## 2011-07-06 MED ORDER — OMALIZUMAB 150 MG ~~LOC~~ SOLR
375.0000 mg | Freq: Once | SUBCUTANEOUS | Status: AC
  Administered 2011-07-06: 375 mg via SUBCUTANEOUS

## 2011-07-11 ENCOUNTER — Ambulatory Visit: Payer: Self-pay | Admitting: Pulmonary Disease

## 2011-07-13 ENCOUNTER — Telehealth: Payer: Self-pay | Admitting: *Deleted

## 2011-07-13 ENCOUNTER — Encounter: Payer: Self-pay | Admitting: Pulmonary Disease

## 2011-07-13 NOTE — Telephone Encounter (Signed)
Message copied by Tommie Sams on Wed Jul 13, 2011  1:12 PM ------      Message from: Coralyn Helling      Created: Wed Jul 13, 2011  8:36 AM       Please schedule Mr. Aumiller for ROV to discuss CT chest results.  Thanks.

## 2011-07-13 NOTE — Telephone Encounter (Signed)
Pt is coming in Friday 8/17 at 1:45 to discuss these results

## 2011-07-15 ENCOUNTER — Ambulatory Visit (INDEPENDENT_AMBULATORY_CARE_PROVIDER_SITE_OTHER): Payer: Medicare PPO | Admitting: Pulmonary Disease

## 2011-07-15 ENCOUNTER — Encounter: Payer: Self-pay | Admitting: Pulmonary Disease

## 2011-07-15 DIAGNOSIS — J45909 Unspecified asthma, uncomplicated: Secondary | ICD-10-CM

## 2011-07-15 DIAGNOSIS — R911 Solitary pulmonary nodule: Secondary | ICD-10-CM

## 2011-07-15 DIAGNOSIS — J984 Other disorders of lung: Secondary | ICD-10-CM

## 2011-07-15 NOTE — Progress Notes (Signed)
Subjective:    Patient ID: [name], male    DOB: [birth year], 77 y.o.   MRN: [id number]  HPI Dr. Andee Poles, Dr. Saverio Danker, Dr. Assunta Gambles  76 yo male with asthma, allergic rhinitis on xolair.  He is here to review CT chest results form [facility].   Review of Systems     Objective:   Physical Exam BP 142/86  Pulse 63  Temp(Src) 97.9 F (36.6 C) (Oral)  Ht 5\' 11"  (1.803 m)  Wt 211 lb 12.8 oz (96.072 kg)  BMI 29.54 kg/m2  SpO2 100%  General - Thin  HEENT - no sinus tenderness, clear nasal drainage, no oral exudate, no LAN  Cardiac - s1s2 regular, no murmur  Chest - no wheeze/rales  Abd - soft, non-tender  Ext - no edema  Neuro - normal strength, CN intact  Psych - normal mood, behavior  CT chest 07/11/11 ([place] [facility])>>2 mm Rt apical nodule perhaps slight increase from December 2010, calcified nodule lingula, emphysema changes.    Assessment & Plan:   Pulmonary nodule He has stable RUL 2 mm nodule since 2010.  He does not need any radiographic follow up for this.  Intrinsic asthma He is reluctant to change xolair dose schedule while he is working on his bladder catheter issues.  Will re-address this at next visit.      Updated Medication List Outpatient Encounter Prescriptions as of 07/15/2011  Medication Sig Dispense Refill  . ALPRAZolam (XANAX) 0.5 MG tablet Take 0.5 mg by mouth daily.        Marland Kitchen aspirin 81 MG tablet Take 81 mg by mouth daily.        . furosemide (LASIX) 20 MG tablet Take 20 mg by mouth daily.        Marland Kitchen gabapentin (NEURONTIN) 300 MG capsule Take 300 mg by mouth 3 (three) times daily.        . hydroxychloroquine (PLAQUENIL) 200 MG tablet Take 200 mg by mouth 2 (two) times daily.        Marland Kitchen ibuprofen (ADVIL,MOTRIN) 800 MG tablet Take 800 mg by mouth every 8 (eight) hours as needed.        . methotrexate 2.5 MG tablet Take 22.5 mg by mouth once a week.       . metoprolol (TOPROL-XL) 50 MG 24 hr tablet TAKE ONE TABLET BY MOUTH EVERY DAY FOR HIGH  BLOOD PRESSURE  90 tablet  3  . Multiple Vitamin (MULTIVITAMIN) tablet Take 1 tablet by mouth daily.        Marland Kitchen omalizumab (XOLAIR) 150 MG injection Inject 375 mg into the skin every 14 (fourteen) days.        Marland Kitchen omeprazole (PRILOSEC) 20 MG capsule TAKE ONE CAPSULE BY MOUTH EVERY DAY  30 capsule  6  . polyethylene glycol (GLYCOLAX) packet Take 17 g by mouth daily.       . pravastatin (PRAVACHOL) 20 MG tablet TAKE ONE TABLET BY MOUTH EVERY DAY  30 tablet  11  . sodium chloride 1 G tablet Take 1 tablet (1 g total) by mouth 3 (three) times daily.  90 tablet  3  . solifenacin (VESICARE) 5 MG tablet Take 5 mg by mouth daily. For bladder spasms       . traZODone (DESYREL) 100 MG tablet TAKE 1-2 TABLETS BY MOUTH EVERY NIGHT AT BEDTIME AS NEEDED FOR SLEEP  60 tablet  11  . VENTOLIN HFA 108 (90 BASE) MCG/ACT inhaler INHALE TWO PUFFS BY MOUTH UP TO 4  TIMES DAILY AS NEEDED  18 g  5

## 2011-07-15 NOTE — Assessment & Plan Note (Signed)
He has stable RUL 2 mm nodule since 2010.  He does not need any radiographic follow up for this.

## 2011-07-15 NOTE — Assessment & Plan Note (Signed)
He is reluctant to change xolair dose schedule while he is working on his bladder catheter issues.  Will re-address this at next visit. 

## 2011-07-15 NOTE — Patient Instructions (Signed)
Follow up in 3 months

## 2011-07-19 ENCOUNTER — Ambulatory Visit (INDEPENDENT_AMBULATORY_CARE_PROVIDER_SITE_OTHER): Payer: Medicare PPO

## 2011-07-19 DIAGNOSIS — J45909 Unspecified asthma, uncomplicated: Secondary | ICD-10-CM

## 2011-07-20 ENCOUNTER — Ambulatory Visit: Payer: Medicare PPO | Admitting: Internal Medicine

## 2011-07-20 MED ORDER — OMALIZUMAB 150 MG ~~LOC~~ SOLR
375.0000 mg | Freq: Once | SUBCUTANEOUS | Status: AC
  Administered 2011-07-20: 375 mg via SUBCUTANEOUS

## 2011-07-25 ENCOUNTER — Encounter: Payer: Self-pay | Admitting: Pulmonary Disease

## 2011-07-26 ENCOUNTER — Encounter: Payer: Self-pay | Admitting: Internal Medicine

## 2011-07-26 ENCOUNTER — Ambulatory Visit (INDEPENDENT_AMBULATORY_CARE_PROVIDER_SITE_OTHER): Payer: Medicare PPO | Admitting: Internal Medicine

## 2011-07-26 DIAGNOSIS — I10 Essential (primary) hypertension: Secondary | ICD-10-CM

## 2011-07-26 DIAGNOSIS — M069 Rheumatoid arthritis, unspecified: Secondary | ICD-10-CM

## 2011-07-26 DIAGNOSIS — J45909 Unspecified asthma, uncomplicated: Secondary | ICD-10-CM

## 2011-07-26 DIAGNOSIS — E785 Hyperlipidemia, unspecified: Secondary | ICD-10-CM

## 2011-07-26 DIAGNOSIS — N319 Neuromuscular dysfunction of bladder, unspecified: Secondary | ICD-10-CM

## 2011-07-26 DIAGNOSIS — E871 Hypo-osmolality and hyponatremia: Secondary | ICD-10-CM

## 2011-07-26 MED ORDER — ALPRAZOLAM 0.5 MG PO TABS: 0.5000 mg | ORAL_TABLET | Freq: Three times a day (TID) | ORAL | Status: DC | PRN

## 2011-07-26 NOTE — Assessment & Plan Note (Signed)
BP Readings from Last 3 Encounters:  07/26/11 93/70  07/15/11 142/86  07/05/11 124/62   Has actually been low at times No symptoms--will continue meds

## 2011-07-26 NOTE — Assessment & Plan Note (Signed)
Dr Gavin Potters follows Still gets every 6 month eye exams on plaquenil

## 2011-07-26 NOTE — Assessment & Plan Note (Signed)
Still not comfortable with self caths Discussed with him Would not consider surgery till he was doing intermittent caths and knew he needed them frequently

## 2011-07-26 NOTE — Assessment & Plan Note (Signed)
Has been controlled He is uncomfortable with weaning the xolair i told him he should consider this

## 2011-07-26 NOTE — Assessment & Plan Note (Signed)
Continues on salt and furosemide Dr Cherylann Ratel will decide if we can try weaning off this

## 2011-07-26 NOTE — Progress Notes (Signed)
Addended by: Tillman Abide I on: 07/26/2011 01:43 PM   Modules accepted: Orders

## 2011-07-26 NOTE — Progress Notes (Signed)
Subjective:    Patient ID: [name], male    DOB: 1933/12/31, 77 y.o.   MRN: [id number]  HPI Here for follow up  Recent visit with Dr Achilles Dunk Not quite ready to do self caths again Working on it with him Wonders about "pacemaker" for bladder  Dr Gavin Potters just injected left knee--really helps for a while Continues on MTX and plaquenil  Eating well Weight down ~10#  Still on salt and furosemide Follows with Dr Cherylann Ratel  No problems with asthma Continues on xolair  Nerves still act up at times Uses the xanax once a day usually--in [place] and it helps him stay calm all day  Current Outpatient Prescriptions on File Prior to Visit  Medication Sig Dispense Refill  . ALPRAZolam (XANAX) 0.5 MG tablet Take 0.5 mg by mouth daily.        Marland Kitchen aspirin 81 MG tablet Take 81 mg by mouth daily.        . furosemide (LASIX) 20 MG tablet Take 20 mg by mouth daily.        Marland Kitchen gabapentin (NEURONTIN) 300 MG capsule Take 300 mg by mouth 3 (three) times daily.        . hydroxychloroquine (PLAQUENIL) 200 MG tablet Take 200 mg by mouth 2 (two) times daily.        Marland Kitchen ibuprofen (ADVIL,MOTRIN) 800 MG tablet Take 800 mg by mouth every 8 (eight) hours as needed.        . methotrexate 2.5 MG tablet Take 22.5 mg by mouth once a week.       . metoprolol (TOPROL-XL) 50 MG 24 hr tablet TAKE ONE TABLET BY MOUTH EVERY DAY FOR HIGH BLOOD PRESSURE  90 tablet  3  . Multiple Vitamin (MULTIVITAMIN) tablet Take 1 tablet by mouth daily.        Marland Kitchen omalizumab (XOLAIR) 150 MG injection Inject 375 mg into the skin every 14 (fourteen) days.        Marland Kitchen omeprazole (PRILOSEC) 20 MG capsule TAKE ONE CAPSULE BY MOUTH EVERY DAY  30 capsule  6  . polyethylene glycol (GLYCOLAX) packet Take 17 g by mouth daily.       . pravastatin (PRAVACHOL) 20 MG tablet TAKE ONE TABLET BY MOUTH EVERY DAY  30 tablet  11  . sodium chloride 1 G tablet Take 1 tablet (1 g total) by mouth 3 (three) times daily.  90 tablet  3  . solifenacin (VESICARE) 5 MG tablet Take  5 mg by mouth daily. For bladder spasms       . traZODone (DESYREL) 100 MG tablet TAKE 1-2 TABLETS BY MOUTH EVERY NIGHT AT BEDTIME AS NEEDED FOR SLEEP  60 tablet  11  . VENTOLIN HFA 108 (90 BASE) MCG/ACT inhaler INHALE TWO PUFFS BY MOUTH UP TO 4 TIMES DAILY AS NEEDED  18 g  5    Allergies  Allergen Reactions  . Ciprofloxacin   . Citalopram Hydrobromide   . Lorazepam   . Paroxetine   . Ramipril   . Simvastatin     Past Medical History  Diagnosis Date  . CAD (coronary artery disease)   . Neurogenic bladder   . Asthma   . Chronic sinusitis   . Diverticulitis   . GERD (gastroesophageal reflux disease)   . Arthritis     rheumatoid  . Cancer     Prostate  . IBS (irritable bowel syndrome)   . Anxiety   . Neurodermatitis   . ED (erectile dysfunction)   .  OSA (obstructive sleep apnea)   . Allergy   . Hypertension   . Depression   . Hx of adenomatous colonic polyps   . History of SIADH   . Hyponatremia     Past Surgical History  Procedure Date  . Prostate surgery     PROSTATECTOMY  . Knee surgery     ARTHROSCOPY LEFT  . Nasal sinus surgery 2009    DEVIATED SEPTUM AND POLYPS  . Urethral stricture dilatation 02-2010    Dr.Cope    Family History  Problem Relation Age of Onset  . Heart disease Mother 7    heart failure  . Pneumonia Father 54    pnemonia    History   Social History  . Marital Status: Married    Spouse Name: N/A    Number of Children: 2  . Years of Education: N/A   Occupational History  . Radio station owner     retired   Social History Main Topics  . Smoking status: Never Smoker   . Smokeless tobacco: Never Used  . Alcohol Use: Yes  . Drug Use: No  . Sexually Active:    Other Topics Concern  . Not on file   Social History Narrative  . No narrative on file   Review of Systems Reviewed CT scan---nodule is unchanged. Still with hilar findings without change since 12/11 (?nodes) Sleep is not great---not clear that the med helps  (trazodone 200mg  ) Does get out of house more--joins wife out. Now able to drive some again    Objective:   Physical Exam  Constitutional: He appears well-developed and well-nourished. No distress.  Neck: Normal range of motion. Neck supple.  Cardiovascular: Normal rate, regular rhythm and normal heart sounds.  Exam reveals no gallop.   No murmur heard. Pulmonary/Chest: Effort normal and breath sounds normal. No respiratory distress. He has no wheezes. He has no rales.  Abdominal: Soft. There is no tenderness.  Musculoskeletal:       Trace ankle edema  Lymphadenopathy:    He has no cervical adenopathy.  Psychiatric: He has a normal mood and affect. His behavior is normal. Judgment and thought content normal.          Assessment & Plan:

## 2011-07-28 NOTE — Progress Notes (Signed)
Addended by: Alvina Chou on: 07/28/2011 02:29 PM   Modules accepted: Orders

## 2011-08-02 ENCOUNTER — Other Ambulatory Visit: Payer: Medicare PPO

## 2011-08-02 ENCOUNTER — Ambulatory Visit (INDEPENDENT_AMBULATORY_CARE_PROVIDER_SITE_OTHER): Payer: Medicare PPO

## 2011-08-02 ENCOUNTER — Other Ambulatory Visit (INDEPENDENT_AMBULATORY_CARE_PROVIDER_SITE_OTHER): Payer: Medicare PPO

## 2011-08-02 DIAGNOSIS — E871 Hypo-osmolality and hyponatremia: Secondary | ICD-10-CM

## 2011-08-02 DIAGNOSIS — I10 Essential (primary) hypertension: Secondary | ICD-10-CM

## 2011-08-02 DIAGNOSIS — E785 Hyperlipidemia, unspecified: Secondary | ICD-10-CM

## 2011-08-02 DIAGNOSIS — J45909 Unspecified asthma, uncomplicated: Secondary | ICD-10-CM

## 2011-08-02 LAB — LIPID PANEL
Cholesterol: 136 mg/dL (ref 0–200)
Triglycerides: 66 mg/dL (ref 0.0–149.0)

## 2011-08-02 LAB — BASIC METABOLIC PANEL
BUN: 18 mg/dL (ref 6–23)
CO2: 33 mEq/L — ABNORMAL HIGH (ref 19–32)
Chloride: 99 mEq/L (ref 96–112)
Creatinine, Ser: 0.7 mg/dL (ref 0.4–1.5)
Potassium: 4.4 mEq/L (ref 3.5–5.1)

## 2011-08-04 ENCOUNTER — Encounter: Payer: Self-pay | Admitting: *Deleted

## 2011-08-04 DIAGNOSIS — J45909 Unspecified asthma, uncomplicated: Secondary | ICD-10-CM

## 2011-08-04 MED ORDER — OMALIZUMAB 150 MG ~~LOC~~ SOLR
375.0000 mg | Freq: Once | SUBCUTANEOUS | Status: AC
  Administered 2011-08-04: 375 mg via SUBCUTANEOUS

## 2011-08-16 ENCOUNTER — Ambulatory Visit (INDEPENDENT_AMBULATORY_CARE_PROVIDER_SITE_OTHER): Payer: Medicare PPO

## 2011-08-17 DIAGNOSIS — J45909 Unspecified asthma, uncomplicated: Secondary | ICD-10-CM

## 2011-08-17 MED ORDER — OMALIZUMAB 150 MG ~~LOC~~ SOLR
375.0000 mg | Freq: Once | SUBCUTANEOUS | Status: AC
  Administered 2011-08-17: 375 mg via SUBCUTANEOUS

## 2011-08-30 ENCOUNTER — Ambulatory Visit (INDEPENDENT_AMBULATORY_CARE_PROVIDER_SITE_OTHER): Payer: Medicare PPO | Admitting: Family Medicine

## 2011-08-30 ENCOUNTER — Ambulatory Visit (INDEPENDENT_AMBULATORY_CARE_PROVIDER_SITE_OTHER): Payer: Medicare PPO

## 2011-08-30 ENCOUNTER — Encounter: Payer: Self-pay | Admitting: Family Medicine

## 2011-08-30 VITALS — BP 120/72 | HR 93 | Temp 98.6°F | Wt 212.2 lb

## 2011-08-30 DIAGNOSIS — J209 Acute bronchitis, unspecified: Secondary | ICD-10-CM

## 2011-08-30 DIAGNOSIS — J441 Chronic obstructive pulmonary disease with (acute) exacerbation: Secondary | ICD-10-CM

## 2011-08-30 DIAGNOSIS — J45909 Unspecified asthma, uncomplicated: Secondary | ICD-10-CM

## 2011-08-30 DIAGNOSIS — J45901 Unspecified asthma with (acute) exacerbation: Secondary | ICD-10-CM

## 2011-08-30 MED ORDER — HYDROCODONE-HOMATROPINE 5-1.5 MG/5ML PO SYRP: ORAL_SOLUTION | ORAL | Status: AC

## 2011-08-30 MED ORDER — DOXYCYCLINE HYCLATE 100 MG PO TABS: 100.0000 mg | ORAL_TABLET | Freq: Two times a day (BID) | ORAL | Status: AC

## 2011-08-30 MED ORDER — PREDNISONE 20 MG PO TABS: 20.0000 mg | ORAL_TABLET | Freq: Every day | ORAL | Status: AC

## 2011-08-30 MED ORDER — OMALIZUMAB 150 MG ~~LOC~~ SOLR
375.0000 mg | Freq: Once | SUBCUTANEOUS | Status: AC
  Administered 2011-08-30: 375 mg via SUBCUTANEOUS

## 2011-08-30 NOTE — Patient Instructions (Signed)
BRONCHITIS (and lung flare) -Viral or baterial infections of the lung. Fever, cough, chest pain, shortness of breath, phlegm production, fatigue are symptoms.  Treatment: 1. Take all medicines 2. Antibiotics  3. Cough suppressants 4. Bronchodilators: an inhaler 5. Expectorant like Guaifenesin (Robitussin, Mucinex)  Fluids and Moisture help: drink lots of fluids Vaporizier or humidifier in room, shower steam --help loosen secretions and sooth breathing passages  Elevate head slightly when trying to sleep.

## 2011-08-30 NOTE — Progress Notes (Signed)
  Subjective:    Patient ID: [name], male    DOB: [birth year], 77 y.o.   MRN: [id number]  HPI  [name], a 77 y.o. male presents today in the office for the following:    Pleasant elderly gentleman with a history of severe asthma who presents with some shortness of breath, coughing, laryngitis, with minimal wheezing. He is not having a productive cough. He is afebrile currently. No other URI type symptoms and minimal runny nose, no earache, sore throat. No nausea, vomiting, or other GI symptoms.  Getting Xolair from Dr. Tonny Branch little short of breath Has lost hi voice  Hurts right in chest.   The PMH, PSH, Social History, Family History, Medications, and allergies have been reviewed in [facility], and have been updated if relevant.   Review of Systems ROS: GEN: Acute illness details above GI: Tolerating PO intake GU: maintaining adequate hydration and urination Pulm: No SOB Interactive and getting along well at home.  Otherwise, ROS is as per the HPI. k    Objective:   Physical Exam   Physical Exam  Blood pressure 120/72, pulse 93, temperature 98.6 F (37 C), temperature source Oral, weight 212 lb 4 oz (96.276 kg).  GEN: A and O x 3. WDWN. NAD.    ENT: Nose clear, ext NML.  No LAD.  No JVD.  TM's clear. Oropharynx clear.  PULM: Normal WOB, no distress. No crackles, wheezes, rhonchi. Generalized decreased bs CV: RRR, no M/G/R, No rubs, No JVD.   ABD: S, NT, ND, + BS. No rebound. No guarding. No HSM.   EXT: warm and well-perfused PSYCH: Pleasant and conversant.       Assessment & Plan:   1. COPD exacerbation  doxycycline (VIBRA-TABS) 100 MG tablet, predniSONE (DELTASONE) 20 MG tablet, HYDROcodone-homatropine (HYCODAN) 5-1.5 MG/5ML syrup  2. Bronchitis with asthma, acute  doxycycline (VIBRA-TABS) 100 MG tablet, predniSONE (DELTASONE) 20 MG tablet, HYDROcodone-homatropine (HYCODAN) 5-1.5 MG/5ML syrup  3. Intrinsic asthma      All meds above Prn cough syrup ok  at night

## 2011-09-13 ENCOUNTER — Ambulatory Visit (INDEPENDENT_AMBULATORY_CARE_PROVIDER_SITE_OTHER): Payer: Medicare PPO

## 2011-09-13 DIAGNOSIS — J45909 Unspecified asthma, uncomplicated: Secondary | ICD-10-CM

## 2011-09-14 DIAGNOSIS — J45909 Unspecified asthma, uncomplicated: Secondary | ICD-10-CM

## 2011-09-14 MED ORDER — OMALIZUMAB 150 MG ~~LOC~~ SOLR
375.0000 mg | Freq: Once | SUBCUTANEOUS | Status: AC
  Administered 2011-09-14: 375 mg via SUBCUTANEOUS

## 2011-09-21 ENCOUNTER — Other Ambulatory Visit: Payer: Self-pay | Admitting: Internal Medicine

## 2011-09-21 NOTE — Telephone Encounter (Signed)
Okay #90 x 0 

## 2011-09-21 NOTE — Telephone Encounter (Signed)
rx called into pharmacy

## 2011-09-26 ENCOUNTER — Ambulatory Visit (INDEPENDENT_AMBULATORY_CARE_PROVIDER_SITE_OTHER): Payer: Medicare PPO

## 2011-09-26 DIAGNOSIS — Z23 Encounter for immunization: Secondary | ICD-10-CM

## 2011-09-27 ENCOUNTER — Ambulatory Visit (INDEPENDENT_AMBULATORY_CARE_PROVIDER_SITE_OTHER): Payer: Medicare PPO

## 2011-09-27 DIAGNOSIS — J45909 Unspecified asthma, uncomplicated: Secondary | ICD-10-CM

## 2011-09-30 MED ORDER — OMALIZUMAB 150 MG ~~LOC~~ SOLR
375.0000 mg | Freq: Once | SUBCUTANEOUS | Status: AC
  Administered 2011-09-30: 375 mg via SUBCUTANEOUS

## 2011-10-11 ENCOUNTER — Encounter: Payer: Self-pay | Admitting: Pulmonary Disease

## 2011-10-11 ENCOUNTER — Ambulatory Visit (INDEPENDENT_AMBULATORY_CARE_PROVIDER_SITE_OTHER): Payer: Medicare PPO

## 2011-10-11 ENCOUNTER — Ambulatory Visit (INDEPENDENT_AMBULATORY_CARE_PROVIDER_SITE_OTHER): Payer: Medicare PPO | Admitting: Pulmonary Disease

## 2011-10-11 DIAGNOSIS — J329 Chronic sinusitis, unspecified: Secondary | ICD-10-CM

## 2011-10-11 DIAGNOSIS — J45909 Unspecified asthma, uncomplicated: Secondary | ICD-10-CM

## 2011-10-11 NOTE — Progress Notes (Signed)
Chief Complaint  Patient presents with  . Follow-up    Pt states his breathing has been fine. Pt states sometimes during the night he can get up clear phlem, drainage down back of throat    History of Present Illness: Dr. Andee Poles, Dr. Saverio Danker, Dr. Assunta Gambles   [name] is a 77 y.o. male with asthma, allergic rhinitis on xolair.  He was treated for an exacerbation with prednisone and doxycycline in October.  He has been doing well since.  He has noticed his sinuses are more congestion.  He has not been using nasal irrigation recently.    Past Medical History  Diagnosis Date  . CAD (coronary artery disease)   . Neurogenic bladder   . Asthma   . Chronic sinusitis   . Diverticulitis   . GERD (gastroesophageal reflux disease)   . Arthritis     rheumatoid  . Cancer     Prostate  . IBS (irritable bowel syndrome)   . Anxiety   . Neurodermatitis   . ED (erectile dysfunction)   . OSA (obstructive sleep apnea)   . Allergy   . Hypertension   . Depression   . Hx of adenomatous colonic polyps   . History of SIADH   . Hyponatremia     Past Surgical History  Procedure Date  . Prostate surgery     PROSTATECTOMY  . Knee surgery     ARTHROSCOPY LEFT  . Nasal sinus surgery 2009    DEVIATED SEPTUM AND POLYPS  . Urethral stricture dilatation 02-2010    Dr.Cope    Current Outpatient Prescriptions on File Prior to Visit  Medication Sig Dispense Refill  . ALPRAZolam (XANAX) 0.5 MG tablet Take 1 tablet (0.5 mg total) by mouth 3 (three) times daily as needed for sleep.  90 tablet  0  . aspirin 81 MG tablet Take 81 mg by mouth daily.        . furosemide (LASIX) 20 MG tablet Take 20 mg by mouth daily.        Marland Kitchen gabapentin (NEURONTIN) 300 MG capsule Take 300 mg by mouth 3 (three) times daily.        . hydroxychloroquine (PLAQUENIL) 200 MG tablet Take 200 mg by mouth 2 (two) times daily.        Marland Kitchen ibuprofen (ADVIL,MOTRIN) 800 MG tablet Take 800 mg by mouth every 8 (eight) hours as  needed.        . methotrexate 2.5 MG tablet Take 22.5 mg by mouth once a week.       . metoprolol (TOPROL-XL) 50 MG 24 hr tablet TAKE ONE TABLET BY MOUTH EVERY DAY FOR HIGH BLOOD PRESSURE  90 tablet  3  . Multiple Vitamin (MULTIVITAMIN) tablet Take 1 tablet by mouth daily.        Marland Kitchen omalizumab (XOLAIR) 150 MG injection Inject 375 mg into the skin every 14 (fourteen) days.        Marland Kitchen omeprazole (PRILOSEC) 20 MG capsule TAKE ONE CAPSULE BY MOUTH EVERY DAY  30 capsule  6  . polyethylene glycol (GLYCOLAX) packet Take 17 g by mouth daily.       . pravastatin (PRAVACHOL) 20 MG tablet TAKE ONE TABLET BY MOUTH EVERY DAY  30 tablet  11  . sodium chloride 1 G tablet Take 1 tablet (1 g total) by mouth 3 (three) times daily.  90 tablet  3  . solifenacin (VESICARE) 5 MG tablet Take 5 mg by mouth daily. For bladder  spasms       . VENTOLIN HFA 108 (90 BASE) MCG/ACT inhaler INHALE TWO PUFFS BY MOUTH UP TO 4 TIMES DAILY AS NEEDED  18 g  5    Allergies  Allergen Reactions  . Ciprofloxacin   . Citalopram Hydrobromide   . Lorazepam   . Paroxetine   . Ramipril   . Simvastatin     Physical Exam:  Blood pressure 110/66, pulse 63, temperature 97.9 F (36.6 C), temperature source Oral, height 5\' 11"  (1.803 m), weight 217 lb 9.6 oz (98.703 kg), SpO2 95.00%.  General - no distress HEENT - no sinus tenderness, clear nasal discharge, no oral exudate, no LAN Cardiac - s1s2 regular Chest - no wheeze/rales Abdomen - soft, non-tender Extremities - 1+ ankle edema Skin - no rash Neurologic - normal strength Psychiatric - normal mood, behavior   Assessment/Plan:  Intrinsic asthma He is well maintained on xolair.  He would like to continue this.  He may be getting some anti-inflammatory benefit for his asthma from his use of methotrexate also.  Chronic sinusitis Advised him to resume his sinus regimen.     Outpatient Encounter Prescriptions as of 10/11/2011  Medication Sig Dispense Refill  . ALPRAZolam  (XANAX) 0.5 MG tablet Take 1 tablet (0.5 mg total) by mouth 3 (three) times daily as needed for sleep.  90 tablet  0  . aspirin 81 MG tablet Take 81 mg by mouth daily.        . furosemide (LASIX) 20 MG tablet Take 20 mg by mouth daily.        Marland Kitchen gabapentin (NEURONTIN) 300 MG capsule Take 300 mg by mouth 3 (three) times daily.        . hydroxychloroquine (PLAQUENIL) 200 MG tablet Take 200 mg by mouth 2 (two) times daily.        Marland Kitchen ibuprofen (ADVIL,MOTRIN) 800 MG tablet Take 800 mg by mouth every 8 (eight) hours as needed.        . methotrexate 2.5 MG tablet Take 22.5 mg by mouth once a week.       . metoprolol (TOPROL-XL) 50 MG 24 hr tablet TAKE ONE TABLET BY MOUTH EVERY DAY FOR HIGH BLOOD PRESSURE  90 tablet  3  . Multiple Vitamin (MULTIVITAMIN) tablet Take 1 tablet by mouth daily.        Marland Kitchen omalizumab (XOLAIR) 150 MG injection Inject 375 mg into the skin every 14 (fourteen) days.        Marland Kitchen omeprazole (PRILOSEC) 20 MG capsule TAKE ONE CAPSULE BY MOUTH EVERY DAY  30 capsule  6  . polyethylene glycol (GLYCOLAX) packet Take 17 g by mouth daily.       . pravastatin (PRAVACHOL) 20 MG tablet TAKE ONE TABLET BY MOUTH EVERY DAY  30 tablet  11  . sodium chloride 1 G tablet Take 1 tablet (1 g total) by mouth 3 (three) times daily.  90 tablet  3  . solifenacin (VESICARE) 5 MG tablet Take 5 mg by mouth daily. For bladder spasms       . VENTOLIN HFA 108 (90 BASE) MCG/ACT inhaler INHALE TWO PUFFS BY MOUTH UP TO 4 TIMES DAILY AS NEEDED  18 g  5  . DISCONTD: traZODone (DESYREL) 100 MG tablet TAKE 1-2 TABLETS BY MOUTH EVERY NIGHT AT BEDTIME AS NEEDED FOR SLEEP  60 tablet  11    Arvie Bartholomew Pager:  [phone] 10/11/2011, 2:32 PM

## 2011-10-11 NOTE — Assessment & Plan Note (Signed)
He is well maintained on xolair.  He would like to continue this.  He may be getting some anti-inflammatory benefit for his asthma from his use of methotrexate also.

## 2011-10-11 NOTE — Assessment & Plan Note (Signed)
Advised him to resume his sinus regimen.

## 2011-10-11 NOTE — Patient Instructions (Signed)
Follow up in February 2013

## 2011-10-15 ENCOUNTER — Other Ambulatory Visit: Payer: Self-pay | Admitting: Internal Medicine

## 2011-10-16 DIAGNOSIS — J45909 Unspecified asthma, uncomplicated: Secondary | ICD-10-CM

## 2011-10-16 MED ORDER — OMALIZUMAB 150 MG ~~LOC~~ SOLR
375.0000 mg | Freq: Once | SUBCUTANEOUS | Status: AC
  Administered 2011-10-16: 375 mg via SUBCUTANEOUS

## 2011-10-17 NOTE — Telephone Encounter (Signed)
Okay #90 x 0 

## 2011-10-17 NOTE — Telephone Encounter (Signed)
rx called into pharmacy

## 2011-10-25 ENCOUNTER — Ambulatory Visit (INDEPENDENT_AMBULATORY_CARE_PROVIDER_SITE_OTHER): Payer: Medicare PPO

## 2011-10-25 DIAGNOSIS — J45909 Unspecified asthma, uncomplicated: Secondary | ICD-10-CM

## 2011-10-25 MED ORDER — OMALIZUMAB 150 MG ~~LOC~~ SOLR
375.0000 mg | Freq: Once | SUBCUTANEOUS | Status: AC
  Administered 2011-10-25: 375 mg via SUBCUTANEOUS

## 2011-11-08 ENCOUNTER — Ambulatory Visit (INDEPENDENT_AMBULATORY_CARE_PROVIDER_SITE_OTHER): Payer: Medicare PPO

## 2011-11-08 DIAGNOSIS — J45909 Unspecified asthma, uncomplicated: Secondary | ICD-10-CM

## 2011-11-10 DIAGNOSIS — J45909 Unspecified asthma, uncomplicated: Secondary | ICD-10-CM

## 2011-11-10 MED ORDER — OMALIZUMAB 150 MG ~~LOC~~ SOLR
375.0000 mg | Freq: Once | SUBCUTANEOUS | Status: AC
  Administered 2011-11-10: 375 mg via SUBCUTANEOUS

## 2011-11-14 ENCOUNTER — Ambulatory Visit: Payer: Medicare PPO | Admitting: Internal Medicine

## 2011-11-15 ENCOUNTER — Encounter: Payer: Self-pay | Admitting: Gastroenterology

## 2011-11-24 ENCOUNTER — Ambulatory Visit (INDEPENDENT_AMBULATORY_CARE_PROVIDER_SITE_OTHER): Payer: Medicare PPO

## 2011-11-24 DIAGNOSIS — J45909 Unspecified asthma, uncomplicated: Secondary | ICD-10-CM

## 2011-11-25 DIAGNOSIS — J45909 Unspecified asthma, uncomplicated: Secondary | ICD-10-CM

## 2011-11-25 MED ORDER — OMALIZUMAB 150 MG ~~LOC~~ SOLR
375.0000 mg | Freq: Once | SUBCUTANEOUS | Status: AC
  Administered 2011-11-25: 375 mg via SUBCUTANEOUS

## 2011-11-27 ENCOUNTER — Other Ambulatory Visit: Payer: Self-pay | Admitting: Internal Medicine

## 2011-11-28 ENCOUNTER — Other Ambulatory Visit: Payer: Self-pay | Admitting: Internal Medicine

## 2011-11-28 NOTE — Telephone Encounter (Signed)
rx called into pharmacy

## 2011-11-28 NOTE — Telephone Encounter (Signed)
Okay #90 x 0 

## 2011-12-05 ENCOUNTER — Encounter: Payer: Self-pay | Admitting: Internal Medicine

## 2011-12-05 ENCOUNTER — Ambulatory Visit (INDEPENDENT_AMBULATORY_CARE_PROVIDER_SITE_OTHER): Payer: Medicare PPO | Admitting: Internal Medicine

## 2011-12-05 VITALS — BP 128/80 | HR 62 | Temp 97.2°F | Ht 71.0 in | Wt 210.0 lb

## 2011-12-05 DIAGNOSIS — E871 Hypo-osmolality and hyponatremia: Secondary | ICD-10-CM

## 2011-12-05 DIAGNOSIS — J45909 Unspecified asthma, uncomplicated: Secondary | ICD-10-CM

## 2011-12-05 DIAGNOSIS — M1710 Unilateral primary osteoarthritis, unspecified knee: Secondary | ICD-10-CM

## 2011-12-05 DIAGNOSIS — F411 Generalized anxiety disorder: Secondary | ICD-10-CM

## 2011-12-05 DIAGNOSIS — N319 Neuromuscular dysfunction of bladder, unspecified: Secondary | ICD-10-CM

## 2011-12-05 DIAGNOSIS — I10 Essential (primary) hypertension: Secondary | ICD-10-CM

## 2011-12-05 MED ORDER — GABAPENTIN 300 MG PO CAPS: 300.0000 mg | ORAL_CAPSULE | Freq: Three times a day (TID) | ORAL | Status: DC

## 2011-12-05 NOTE — Progress Notes (Signed)
Subjective:    Patient ID: [name], male    DOB: [birth year], 78 y.o.   MRN: [id number]  HPI Just got recalled for colonoscopy Last 11/07---never had polyps Probably can defer to 10 years Still with constipation----does okay with miralax Has been burping---thought it could be due to advil 4 tid Now down to 3 tid----burping is better Keeps on the omeprazole but not every day  Knees still consistently painful Ibuprofen does help  Lungs have been fine Off prednisone Remains on xolair  Still with catheter and leg bag Will be starting to try self caths again soon Still anxious about this Still uses the vesicare due to persistent spasms in bladder with the catheter  Still on lower dose salt and furosemide Sodium has been normal for some time Trying to wean off  Current Outpatient Prescriptions on File Prior to Visit  Medication Sig Dispense Refill  . ALPRAZolam (XANAX) 0.5 MG tablet Take 1 tablet (0.5 mg total) by mouth 3 (three) times daily as needed.  90 tablet  0  . aspirin 81 MG tablet Take 81 mg by mouth daily.        . furosemide (LASIX) 20 MG tablet Take 20 mg by mouth daily.        . hydroxychloroquine (PLAQUENIL) 200 MG tablet Take 200 mg by mouth 2 (two) times daily.        Marland Kitchen ibuprofen (ADVIL,MOTRIN) 800 MG tablet Take 800 mg by mouth every 8 (eight) hours as needed.        . methotrexate 2.5 MG tablet Take 22.5 mg by mouth once a week.       . metoprolol (TOPROL-XL) 50 MG 24 hr tablet TAKE ONE TABLET BY MOUTH EVERY DAY FOR HIGH BLOOD PRESSURE  90 tablet  3  . Multiple Vitamin (MULTIVITAMIN) tablet Take 1 tablet by mouth daily.        Marland Kitchen omalizumab (XOLAIR) 150 MG injection Inject 375 mg into the skin every 14 (fourteen) days.        Marland Kitchen omeprazole (PRILOSEC) 20 MG capsule TAKE ONE CAPSULE BY MOUTH EVERY DAY  30 capsule  6  . polyethylene glycol (GLYCOLAX) packet Take 17 g by mouth daily.       . pravastatin (PRAVACHOL) 20 MG tablet TAKE ONE TABLET BY MOUTH EVERY DAY   30 tablet  11  . sodium chloride 1 G tablet Take 1 tablet (1 g total) by mouth 3 (three) times daily.  90 tablet  3  . solifenacin (VESICARE) 5 MG tablet Take 5 mg by mouth daily. For bladder spasms       . VENTOLIN HFA 108 (90 BASE) MCG/ACT inhaler INHALE TWO PUFFS BY MOUTH UP TO 4 TIMES DAILY AS NEEDED  18 g  5    Allergies  Allergen Reactions  . Ciprofloxacin   . Citalopram Hydrobromide   . Lorazepam   . Paroxetine   . Ramipril   . Simvastatin     Past Medical History  Diagnosis Date  . CAD (coronary artery disease)   . Neurogenic bladder   . Asthma   . Chronic sinusitis   . Diverticulitis   . GERD (gastroesophageal reflux disease)   . Arthritis     rheumatoid  . Cancer     Prostate  . IBS (irritable bowel syndrome)   . Anxiety   . Neurodermatitis   . ED (erectile dysfunction)   . OSA (obstructive sleep apnea)   . Allergy   . Hypertension   .  Depression   . Hx of adenomatous colonic polyps   . History of SIADH   . Hyponatremia     Past Surgical History  Procedure Date  . Prostate surgery     PROSTATECTOMY  . Knee surgery     ARTHROSCOPY LEFT  . Nasal sinus surgery 2009    DEVIATED SEPTUM AND POLYPS  . Urethral stricture dilatation 02-2010    Dr.Cope    Family History  Problem Relation Age of Onset  . Heart disease Mother 47    heart failure  . Pneumonia Father 54    pnemonia    History   Social History  . Marital Status: Married    Spouse Name: N/A    Number of Children: 2  . Years of Education: N/A   Occupational History  . Radio station owner     retired   Social History Main Topics  . Smoking status: Never Smoker   . Smokeless tobacco: Never Used  . Alcohol Use: Yes  . Drug Use: No  . Sexually Active:    Other Topics Concern  . Not on file   Social History Narrative  . No narrative on file   Review of Systems Appetite is fine Weight is down a few pounds     Objective:   Physical Exam  Constitutional: He appears  well-developed and well-nourished. No distress.  Neck: Normal range of motion. Neck supple.  Cardiovascular: Normal rate, regular rhythm and normal heart sounds.  Exam reveals no gallop.   No murmur heard. Pulmonary/Chest: Effort normal and breath sounds normal. No respiratory distress. He has no wheezes. He has no rales.  Abdominal: Soft. There is no tenderness.  Musculoskeletal: He exhibits edema.       Trace edema in feet/ ankles  Lymphadenopathy:    He has no cervical adenopathy.  Psychiatric: He has a normal mood and affect. His behavior is normal. Judgment and thought content normal.          Assessment & Plan:

## 2011-12-05 NOTE — Assessment & Plan Note (Signed)
Will be trying self caths soon Tried to encourage but he is anxious about this

## 2011-12-05 NOTE — Assessment & Plan Note (Signed)
BP Readings from Last 3 Encounters:  12/05/11 128/80  10/11/11 110/66  08/30/11 120/72   Good control No changes needed

## 2011-12-05 NOTE — Assessment & Plan Note (Signed)
Gets relief from ibuprofen on knee arthritis Discussed continuing PPI even if stomach quiet---for gastroprotection

## 2011-12-05 NOTE — Assessment & Plan Note (Signed)
Uses the xanax asa needed

## 2011-12-05 NOTE — Assessment & Plan Note (Signed)
Has been controlled on xolair

## 2011-12-05 NOTE — Assessment & Plan Note (Signed)
weaning off sodium and furosemide per Dr Cherylann Ratel

## 2011-12-08 ENCOUNTER — Ambulatory Visit (INDEPENDENT_AMBULATORY_CARE_PROVIDER_SITE_OTHER): Payer: Medicare PPO

## 2011-12-08 DIAGNOSIS — J45909 Unspecified asthma, uncomplicated: Secondary | ICD-10-CM

## 2011-12-08 MED ORDER — OMALIZUMAB 150 MG ~~LOC~~ SOLR
375.0000 mg | Freq: Once | SUBCUTANEOUS | Status: AC
  Administered 2011-12-08: 375 mg via SUBCUTANEOUS

## 2011-12-22 ENCOUNTER — Ambulatory Visit (INDEPENDENT_AMBULATORY_CARE_PROVIDER_SITE_OTHER): Payer: Medicare PPO

## 2011-12-22 DIAGNOSIS — J45909 Unspecified asthma, uncomplicated: Secondary | ICD-10-CM

## 2011-12-26 DIAGNOSIS — J45909 Unspecified asthma, uncomplicated: Secondary | ICD-10-CM

## 2011-12-26 MED ORDER — OMALIZUMAB 150 MG ~~LOC~~ SOLR
375.0000 mg | Freq: Once | SUBCUTANEOUS | Status: AC
  Administered 2011-12-26: 375 mg via SUBCUTANEOUS

## 2012-01-05 ENCOUNTER — Ambulatory Visit (INDEPENDENT_AMBULATORY_CARE_PROVIDER_SITE_OTHER): Payer: Medicare PPO

## 2012-01-05 DIAGNOSIS — J45909 Unspecified asthma, uncomplicated: Secondary | ICD-10-CM

## 2012-01-05 MED ORDER — OMALIZUMAB 150 MG ~~LOC~~ SOLR
375.0000 mg | Freq: Once | SUBCUTANEOUS | Status: AC
  Administered 2012-01-05: 375 mg via SUBCUTANEOUS

## 2012-01-08 ENCOUNTER — Other Ambulatory Visit: Payer: Self-pay | Admitting: Internal Medicine

## 2012-01-09 NOTE — Telephone Encounter (Signed)
rx called into pharmacy

## 2012-01-09 NOTE — Telephone Encounter (Signed)
Okay #90 x 0 

## 2012-01-19 ENCOUNTER — Ambulatory Visit (INDEPENDENT_AMBULATORY_CARE_PROVIDER_SITE_OTHER): Payer: Medicare PPO

## 2012-01-19 DIAGNOSIS — J45909 Unspecified asthma, uncomplicated: Secondary | ICD-10-CM

## 2012-01-19 MED ORDER — OMALIZUMAB 150 MG ~~LOC~~ SOLR
375.0000 mg | Freq: Once | SUBCUTANEOUS | Status: AC
  Administered 2012-01-19: 375 mg via SUBCUTANEOUS

## 2012-01-24 ENCOUNTER — Encounter: Payer: Self-pay | Admitting: Internal Medicine

## 2012-02-02 ENCOUNTER — Ambulatory Visit (INDEPENDENT_AMBULATORY_CARE_PROVIDER_SITE_OTHER): Payer: Medicare PPO

## 2012-02-02 DIAGNOSIS — J45909 Unspecified asthma, uncomplicated: Secondary | ICD-10-CM

## 2012-02-03 DIAGNOSIS — J45909 Unspecified asthma, uncomplicated: Secondary | ICD-10-CM

## 2012-02-03 MED ORDER — OMALIZUMAB 150 MG ~~LOC~~ SOLR
375.0000 mg | Freq: Once | SUBCUTANEOUS | Status: AC
  Administered 2012-02-03: 375 mg via SUBCUTANEOUS

## 2012-02-16 ENCOUNTER — Ambulatory Visit (INDEPENDENT_AMBULATORY_CARE_PROVIDER_SITE_OTHER): Payer: Medicare PPO

## 2012-02-16 DIAGNOSIS — J45909 Unspecified asthma, uncomplicated: Secondary | ICD-10-CM

## 2012-02-16 MED ORDER — OMALIZUMAB 150 MG ~~LOC~~ SOLR
375.0000 mg | Freq: Once | SUBCUTANEOUS | Status: AC
  Administered 2012-02-16: 375 mg via SUBCUTANEOUS

## 2012-03-01 ENCOUNTER — Ambulatory Visit (INDEPENDENT_AMBULATORY_CARE_PROVIDER_SITE_OTHER): Payer: Medicare PPO

## 2012-03-01 DIAGNOSIS — J45909 Unspecified asthma, uncomplicated: Secondary | ICD-10-CM

## 2012-03-05 DIAGNOSIS — J45909 Unspecified asthma, uncomplicated: Secondary | ICD-10-CM

## 2012-03-05 MED ORDER — OMALIZUMAB 150 MG ~~LOC~~ SOLR
375.0000 mg | Freq: Once | SUBCUTANEOUS | Status: AC
  Administered 2012-03-05: 375 mg via SUBCUTANEOUS

## 2012-03-08 ENCOUNTER — Other Ambulatory Visit: Payer: Self-pay | Admitting: Internal Medicine

## 2012-03-09 NOTE — Telephone Encounter (Signed)
Okay #90 x 0 

## 2012-03-09 NOTE — Telephone Encounter (Signed)
rx called into pharmacy

## 2012-03-15 ENCOUNTER — Ambulatory Visit (INDEPENDENT_AMBULATORY_CARE_PROVIDER_SITE_OTHER): Payer: Medicare PPO

## 2012-03-15 DIAGNOSIS — J45909 Unspecified asthma, uncomplicated: Secondary | ICD-10-CM

## 2012-03-16 DIAGNOSIS — J45909 Unspecified asthma, uncomplicated: Secondary | ICD-10-CM

## 2012-03-16 MED ORDER — OMALIZUMAB 150 MG ~~LOC~~ SOLR
375.0000 mg | Freq: Once | SUBCUTANEOUS | Status: AC
  Administered 2012-03-16: 375 mg via SUBCUTANEOUS

## 2012-03-29 ENCOUNTER — Ambulatory Visit (INDEPENDENT_AMBULATORY_CARE_PROVIDER_SITE_OTHER): Payer: Medicare PPO

## 2012-03-29 DIAGNOSIS — J45909 Unspecified asthma, uncomplicated: Secondary | ICD-10-CM

## 2012-04-02 MED ORDER — OMALIZUMAB 150 MG ~~LOC~~ SOLR
375.0000 mg | Freq: Once | SUBCUTANEOUS | Status: AC
  Administered 2012-04-02: 375 mg via SUBCUTANEOUS

## 2012-04-09 ENCOUNTER — Ambulatory Visit (INDEPENDENT_AMBULATORY_CARE_PROVIDER_SITE_OTHER): Payer: Medicare PPO | Admitting: Internal Medicine

## 2012-04-09 ENCOUNTER — Encounter: Payer: Self-pay | Admitting: Internal Medicine

## 2012-04-09 VITALS — BP 110/60 | HR 71 | Temp 98.6°F | Ht 71.0 in | Wt 221.0 lb

## 2012-04-09 DIAGNOSIS — Z23 Encounter for immunization: Secondary | ICD-10-CM

## 2012-04-09 DIAGNOSIS — E871 Hypo-osmolality and hyponatremia: Secondary | ICD-10-CM

## 2012-04-09 DIAGNOSIS — Z8546 Personal history of malignant neoplasm of prostate: Secondary | ICD-10-CM

## 2012-04-09 DIAGNOSIS — J45909 Unspecified asthma, uncomplicated: Secondary | ICD-10-CM

## 2012-04-09 DIAGNOSIS — Z0000 Encounter for general adult medical examination without abnormal findings: Secondary | ICD-10-CM

## 2012-04-09 DIAGNOSIS — E785 Hyperlipidemia, unspecified: Secondary | ICD-10-CM

## 2012-04-09 DIAGNOSIS — F3289 Other specified depressive episodes: Secondary | ICD-10-CM

## 2012-04-09 DIAGNOSIS — F329 Major depressive disorder, single episode, unspecified: Secondary | ICD-10-CM

## 2012-04-09 DIAGNOSIS — I10 Essential (primary) hypertension: Secondary | ICD-10-CM

## 2012-04-09 DIAGNOSIS — M069 Rheumatoid arthritis, unspecified: Secondary | ICD-10-CM

## 2012-04-09 MED ORDER — OMEPRAZOLE 20 MG PO CPDR: 20.0000 mg | DELAYED_RELEASE_CAPSULE | Freq: Two times a day (BID) | ORAL | Status: DC

## 2012-04-09 NOTE — Assessment & Plan Note (Signed)
reacaive depression for the most part Uses alprazolam for nerves

## 2012-04-09 NOTE — Progress Notes (Signed)
Subjective:    Patient ID: [name], male    DOB: [birth year], 78 y.o.   MRN: [id number]  HPI Here with wife For physical  Has had more stomach troubles Seems to be better since he increased the omeprazole to bid and started probiotic at his son's suggestion Has had some SOB--awakening at night Feels he needs to start the nasal saline rinses again  Feels a "malaise" though nothing specific Has gained a lot of weight Still on lasix --but he has been weaned off the salt. Did miss one day of lasix Generally have been okay  Still has Foley Has set June 12th as his date to start intermittent catheterization  Notes more arthritis pain of late Got shot by Dr Gavin Potters in shoulder---not really helpful Knee pain persists  Current Outpatient Prescriptions on File Prior to Visit  Medication Sig Dispense Refill  . ALPRAZolam (XANAX) 0.5 MG tablet TAKE 1 TABLET BY MOUTH THREE TIMES DAILY AS NEEDED  90 tablet  0  . aspirin 81 MG tablet Take 81 mg by mouth daily.        . furosemide (LASIX) 20 MG tablet Take 20 mg by mouth daily.        Marland Kitchen gabapentin (NEURONTIN) 300 MG capsule Take 1 capsule (300 mg total) by mouth 3 (three) times daily.  90 capsule  3  . hydroxychloroquine (PLAQUENIL) 200 MG tablet Take 200 mg by mouth 2 (two) times daily.        Marland Kitchen ibuprofen (ADVIL,MOTRIN) 800 MG tablet Take 800 mg by mouth every 8 (eight) hours as needed.        . methotrexate 2.5 MG tablet Take 22.5 mg by mouth once a week.       . metoprolol (TOPROL-XL) 50 MG 24 hr tablet TAKE ONE TABLET BY MOUTH EVERY DAY FOR HIGH BLOOD PRESSURE  90 tablet  3  . Multiple Vitamin (MULTIVITAMIN) tablet Take 1 tablet by mouth daily.        Marland Kitchen omalizumab (XOLAIR) 150 MG injection Inject 375 mg into the skin every 14 (fourteen) days.        . polyethylene glycol (GLYCOLAX) packet Take 17 g by mouth daily.       . pravastatin (PRAVACHOL) 20 MG tablet TAKE ONE TABLET BY MOUTH EVERY DAY  30 tablet  11  . solifenacin (VESICARE)  5 MG tablet Take 5 mg by mouth daily. For bladder spasms       . VENTOLIN HFA 108 (90 BASE) MCG/ACT inhaler INHALE TWO PUFFS BY MOUTH UP TO 4 TIMES DAILY AS NEEDED  18 g  5  . DISCONTD: omeprazole (PRILOSEC) 20 MG capsule TAKE ONE CAPSULE BY MOUTH EVERY DAY  30 capsule  6    Allergies  Allergen Reactions  . Ciprofloxacin   . Citalopram Hydrobromide   . Lorazepam   . Paroxetine   . Ramipril   . Simvastatin     Past Medical History  Diagnosis Date  . CAD (coronary artery disease)   . Neurogenic bladder   . Asthma   . Chronic sinusitis   . Diverticulitis   . GERD (gastroesophageal reflux disease)   . Arthritis     rheumatoid  . Cancer     Prostate  . IBS (irritable bowel syndrome)   . Anxiety   . Neurodermatitis   . ED (erectile dysfunction)   . OSA (obstructive sleep apnea)   . Allergy   . Hypertension   . Depression   . Hx  of adenomatous colonic polyps   . History of SIADH   . Hyponatremia     Past Surgical History  Procedure Date  . Prostate surgery     PROSTATECTOMY  . Knee surgery     ARTHROSCOPY LEFT  . Nasal sinus surgery 2009    DEVIATED SEPTUM AND POLYPS  . Urethral stricture dilatation 02-2010    Dr.Cope    Family History  Problem Relation Age of Onset  . Heart disease Mother 77    heart failure  . Pneumonia Father 78    pnemonia    History   Social History  . Marital Status: Married    Spouse Name: N/A    Number of Children: 2  . Years of Education: N/A   Occupational History  . Radio station owner     retired   Social History Main Topics  . Smoking status: Never Smoker   . Smokeless tobacco: Never Used  . Alcohol Use: Yes  . Drug Use: No  . Sexually Active:    Other Topics Concern  . Not on file   Social History Narrative  . No narrative on file   Review of Systems  Constitutional: Positive for fatigue and unexpected weight change.       Wears seat belt  HENT: Positive for hearing loss. Negative for congestion, rhinorrhea  and tinnitus.        Has appt to check hearing Teeth have been managed by Dr Allayne Butcher Allergies have been controlled by the xolair Has had some sores on tongue  Eyes: Negative for visual disturbance.       No diplopia or unilateral vision loss Has appt with Dr Fransico Michael coming up (plaquenil check up)  Respiratory: Positive for cough, shortness of breath and wheezing.        Very sporadic cough  Cardiovascular: Positive for leg swelling. Negative for chest pain and palpitations.  Gastrointestinal: Negative for constipation and blood in stool.       More indigestion but better with med increases  Genitourinary:       Has foley in still  Musculoskeletal: Positive for back pain, joint swelling and arthralgias.       Uses rolling walker  Skin: Negative for pallor and rash.       No suspicious areas  Neurological: Positive for weakness. Negative for dizziness, syncope, light-headedness, numbness and headaches.       Feels generalized weakness---esp right arm (from the shoulder arthritis)  Hematological: Negative for adenopathy. Bruises/bleeds easily.       Bruising is better now but still easy  Psychiatric/Behavioral: Positive for sleep disturbance.       Needs the xanax for sleep Some anxiety Still with intermittent feelings of depression       Objective:   Physical Exam  Constitutional: He is oriented to person, place, and time. He appears well-developed. No distress.  HENT:  Head: Normocephalic and atraumatic.  Right Ear: External ear normal.  Left Ear: External ear normal.  Mouth/Throat: Oropharynx is clear and moist. No oropharyngeal exudate.  Eyes: Conjunctivae and EOM are normal. Pupils are equal, round, and reactive to light.  Neck: Normal range of motion. Neck supple.  Cardiovascular: Normal rate, regular rhythm, normal heart sounds and intact distal pulses.  Exam reveals no gallop.   No murmur heard. Pulmonary/Chest: Effort normal and breath sounds normal. No respiratory  distress. He has no wheezes. He has no rales.  Abdominal: Soft. There is no tenderness.  Musculoskeletal: He exhibits edema.  2+ pitting edema in feet Right frozen shoulder  Lymphadenopathy:    He has no cervical adenopathy.  Neurological: He is alert and oriented to person, place, and time.  Skin:       Dependent rubor on left>right foot No ulcers  Psychiatric: He has a normal mood and affect. His behavior is normal.          Assessment & Plan:

## 2012-04-09 NOTE — Assessment & Plan Note (Signed)
Off the salt Will check labs Lasix prn if sodium normal

## 2012-04-09 NOTE — Assessment & Plan Note (Signed)
Ongoing problems Sees Dr Gavin Potters

## 2012-04-09 NOTE — Assessment & Plan Note (Signed)
BP Readings from Last 3 Encounters:  04/09/12 110/60  12/05/11 128/80  10/11/11 110/66   Good control No changes needed

## 2012-04-09 NOTE — Assessment & Plan Note (Signed)
On the [employer]

## 2012-04-09 NOTE — Assessment & Plan Note (Signed)
Has had overwhelming complex medical pathology Pneumovax today Rx for zostavax Colon due in 2017 if he is to get

## 2012-04-10 LAB — LIPID PANEL
HDL: 58.9 mg/dL (ref >39.00)
Total CHOL/HDL Ratio: 3
Triglycerides: 63 mg/dL (ref 0.0–149.0)

## 2012-04-10 LAB — BASIC METABOLIC PANEL
BUN: 18 mg/dL (ref 6–23)
CO2: 30 mEq/L (ref 19–32)
Chloride: 103 mEq/L (ref 96–112)
Creatinine, Ser: 0.5 mg/dL (ref 0.4–1.5)

## 2012-04-10 LAB — CBC WITH DIFFERENTIAL/PLATELET
Eosinophils Absolute: 0.3 10*3/uL (ref 0.0–0.7)
MCHC: 33.5 g/dL (ref 30.0–36.0)
MCV: 98.1 fl (ref 78.0–100.0)
Monocytes Absolute: 0.4 10*3/uL (ref 0.1–1.0)
Neutrophils Relative %: 70.5 % (ref 43.0–77.0)
Platelets: 139 10*3/uL — ABNORMAL LOW (ref 150.0–400.0)
RDW: 15 % — ABNORMAL HIGH (ref 11.5–14.6)

## 2012-04-10 LAB — HEPATIC FUNCTION PANEL: Total Bilirubin: 0.6 mg/dL (ref 0.3–1.2)

## 2012-04-10 LAB — TSH: TSH: 1.85 u[IU]/mL (ref 0.35–5.50)

## 2012-04-11 ENCOUNTER — Encounter: Payer: Self-pay | Admitting: *Deleted

## 2012-04-12 ENCOUNTER — Other Ambulatory Visit: Payer: Self-pay | Admitting: Internal Medicine

## 2012-04-12 ENCOUNTER — Ambulatory Visit (INDEPENDENT_AMBULATORY_CARE_PROVIDER_SITE_OTHER): Payer: Medicare PPO

## 2012-04-12 DIAGNOSIS — J45909 Unspecified asthma, uncomplicated: Secondary | ICD-10-CM

## 2012-04-12 NOTE — Telephone Encounter (Signed)
rx called into pharmacy

## 2012-04-12 NOTE — Telephone Encounter (Signed)
Okay #90 x 0 

## 2012-04-13 ENCOUNTER — Telehealth: Payer: Self-pay | Admitting: *Deleted

## 2012-04-13 MED ORDER — OMALIZUMAB 150 MG ~~LOC~~ SOLR
375.0000 mg | Freq: Once | SUBCUTANEOUS | Status: AC
  Administered 2012-04-13: 375 mg via SUBCUTANEOUS

## 2012-04-13 NOTE — Telephone Encounter (Signed)
Pt aware of recs from CY; also understands to go to ER or UC if worsens over the weekend.

## 2012-04-13 NOTE — Telephone Encounter (Signed)
Mr. Seago got his xolair shots yesterday. He called this afternoon stating his rt.arm was hot,hurts a lot and there is a knot underneath. I suggested like we do with all our shot pts. He put a cold compress on it that would take the fever out. He gets three shots two in one arm & one in the other. Mr.Aleshire got a pneumonia shot Mon. in his right arm.(primary Dr.) I told him I could give him two in his left again(his rt.arm was hurting some then from pneumonia shot.) he said no it'll be fine. We alternate the 2 shot arm every 2 wks. Please advise.

## 2012-04-13 NOTE — Telephone Encounter (Signed)
CY, this is a SOOD patient but he is not in the office this afternoon; pt had PNA shot on Monday at PCP-started having some pain in Right arm afterward; came by 04-12-12 to get Xolair injections; pt was given 2 shots in Right arm (as scheduled get 375 mg)pt called to let us know he is having warm to touch on his right arm, hurting; denies any SOB, redness, or streakiness on his arm. Pt has not applied cool compress as of this time. Wife stated in the background on the phone that his Right arm is cooling down now. Please advise if any other recs. Thanks.

## 2012-04-13 NOTE — Telephone Encounter (Signed)
This was a lot of shots in a short period of time, but hopefully it will settle down. I would think to suggest Advil, work the arm some, maybe a cool compress if that feels good. If it gets worse over weekend then go to ER/ UC.

## 2012-04-26 ENCOUNTER — Ambulatory Visit (INDEPENDENT_AMBULATORY_CARE_PROVIDER_SITE_OTHER): Payer: Medicare PPO

## 2012-04-26 DIAGNOSIS — J45909 Unspecified asthma, uncomplicated: Secondary | ICD-10-CM

## 2012-04-26 MED ORDER — OMALIZUMAB 150 MG ~~LOC~~ SOLR
375.0000 mg | Freq: Once | SUBCUTANEOUS | Status: AC
  Administered 2012-04-26: 375 mg via SUBCUTANEOUS

## 2012-04-27 ENCOUNTER — Other Ambulatory Visit: Payer: Self-pay | Admitting: Internal Medicine

## 2012-04-30 ENCOUNTER — Other Ambulatory Visit: Payer: Self-pay | Admitting: Internal Medicine

## 2012-05-10 ENCOUNTER — Ambulatory Visit (INDEPENDENT_AMBULATORY_CARE_PROVIDER_SITE_OTHER): Payer: Medicare PPO

## 2012-05-10 DIAGNOSIS — J45909 Unspecified asthma, uncomplicated: Secondary | ICD-10-CM

## 2012-05-10 MED ORDER — OMALIZUMAB 150 MG ~~LOC~~ SOLR
375.0000 mg | Freq: Once | SUBCUTANEOUS | Status: AC
  Administered 2012-05-10: 375 mg via SUBCUTANEOUS

## 2012-05-11 ENCOUNTER — Other Ambulatory Visit: Payer: Self-pay | Admitting: Internal Medicine

## 2012-05-11 NOTE — Telephone Encounter (Signed)
Okay #90 x 1 

## 2012-05-11 NOTE — Telephone Encounter (Signed)
rx called into pharmacy

## 2012-05-24 ENCOUNTER — Ambulatory Visit (INDEPENDENT_AMBULATORY_CARE_PROVIDER_SITE_OTHER): Payer: Medicare PPO

## 2012-05-24 DIAGNOSIS — J45909 Unspecified asthma, uncomplicated: Secondary | ICD-10-CM

## 2012-05-25 DIAGNOSIS — J45909 Unspecified asthma, uncomplicated: Secondary | ICD-10-CM

## 2012-05-25 MED ORDER — OMALIZUMAB 150 MG ~~LOC~~ SOLR
375.0000 mg | Freq: Once | SUBCUTANEOUS | Status: AC
  Administered 2012-05-25: 375 mg via SUBCUTANEOUS

## 2012-05-27 ENCOUNTER — Other Ambulatory Visit: Payer: Self-pay | Admitting: Internal Medicine

## 2012-06-07 ENCOUNTER — Ambulatory Visit (INDEPENDENT_AMBULATORY_CARE_PROVIDER_SITE_OTHER): Payer: Medicare PPO

## 2012-06-07 DIAGNOSIS — J45909 Unspecified asthma, uncomplicated: Secondary | ICD-10-CM

## 2012-06-07 MED ORDER — OMALIZUMAB 150 MG ~~LOC~~ SOLR
375.0000 mg | Freq: Once | SUBCUTANEOUS | Status: AC
  Administered 2012-06-07: 375 mg via SUBCUTANEOUS

## 2012-06-10 ENCOUNTER — Other Ambulatory Visit: Payer: Self-pay | Admitting: Internal Medicine

## 2012-06-15 ENCOUNTER — Other Ambulatory Visit: Payer: Self-pay | Admitting: Internal Medicine

## 2012-06-15 NOTE — Telephone Encounter (Signed)
rx called into pharmacy

## 2012-06-15 NOTE — Telephone Encounter (Signed)
Okay #90 x 0 

## 2012-06-21 ENCOUNTER — Ambulatory Visit (INDEPENDENT_AMBULATORY_CARE_PROVIDER_SITE_OTHER): Payer: Medicare PPO

## 2012-06-21 DIAGNOSIS — J45909 Unspecified asthma, uncomplicated: Secondary | ICD-10-CM

## 2012-06-22 MED ORDER — OMALIZUMAB 150 MG ~~LOC~~ SOLR
375.0000 mg | Freq: Once | SUBCUTANEOUS | Status: AC
  Administered 2012-06-22: 375 mg via SUBCUTANEOUS

## 2012-07-05 ENCOUNTER — Ambulatory Visit (INDEPENDENT_AMBULATORY_CARE_PROVIDER_SITE_OTHER): Payer: Medicare PPO

## 2012-07-05 DIAGNOSIS — J45909 Unspecified asthma, uncomplicated: Secondary | ICD-10-CM

## 2012-07-06 MED ORDER — OMALIZUMAB 150 MG ~~LOC~~ SOLR
375.0000 mg | Freq: Once | SUBCUTANEOUS | Status: AC
  Administered 2012-07-06: 375 mg via SUBCUTANEOUS

## 2012-07-16 ENCOUNTER — Telehealth: Payer: Self-pay

## 2012-07-16 NOTE — Telephone Encounter (Signed)
Pt left v/m; thin skin and bleeding would like Silver Sulfazine cream to [facility]. Left message for pt to call back to get more details.

## 2012-07-17 MED ORDER — SILVER SULFADIAZINE 1 % EX CREA: 1.0000 "application " | TOPICAL_CREAM | Freq: Every day | CUTANEOUS | Status: DC

## 2012-07-17 NOTE — Telephone Encounter (Signed)
Okay to send generic of silvadene cream #30gm x 1

## 2012-07-17 NOTE — Telephone Encounter (Signed)
Spoke with patient and advised results rx sent to pharmacy by e-script  

## 2012-07-17 NOTE — Telephone Encounter (Signed)
Pt request sulfadiazine cream; pt has thin skin on arms and legs and if brushes against something can get skin tear; a skin doctor that has retired had given pt this cream for skin tears. Pt not having a problem now but wants cream available when has problem.[facility] [employer] [facility].Please advise.

## 2012-07-19 ENCOUNTER — Ambulatory Visit (INDEPENDENT_AMBULATORY_CARE_PROVIDER_SITE_OTHER): Payer: Medicare PPO

## 2012-07-19 ENCOUNTER — Other Ambulatory Visit: Payer: Self-pay | Admitting: Internal Medicine

## 2012-07-19 DIAGNOSIS — J45909 Unspecified asthma, uncomplicated: Secondary | ICD-10-CM

## 2012-07-19 MED ORDER — OMALIZUMAB 150 MG ~~LOC~~ SOLR
375.0000 mg | Freq: Once | SUBCUTANEOUS | Status: AC
  Administered 2012-07-19: 375 mg via SUBCUTANEOUS

## 2012-07-19 NOTE — Telephone Encounter (Signed)
rx called into pharmacy

## 2012-07-19 NOTE — Telephone Encounter (Signed)
Okay #90 x 0 

## 2012-08-02 ENCOUNTER — Ambulatory Visit (INDEPENDENT_AMBULATORY_CARE_PROVIDER_SITE_OTHER): Payer: Medicare PPO

## 2012-08-02 ENCOUNTER — Other Ambulatory Visit: Payer: Self-pay

## 2012-08-02 DIAGNOSIS — J45909 Unspecified asthma, uncomplicated: Secondary | ICD-10-CM

## 2012-08-02 MED ORDER — GABAPENTIN 300 MG PO CAPS: 300.0000 mg | ORAL_CAPSULE | Freq: Three times a day (TID) | ORAL | Status: DC

## 2012-08-02 NOTE — Telephone Encounter (Signed)
Pt request refill gabapentin; pt will be out of med 08/03/12.Please advise.

## 2012-08-02 NOTE — Telephone Encounter (Signed)
rx sent to pharmacy by e-script Spoke with patient's wife and advised results  

## 2012-08-03 MED ORDER — OMALIZUMAB 150 MG ~~LOC~~ SOLR
375.0000 mg | Freq: Once | SUBCUTANEOUS | Status: AC
  Administered 2012-08-03: 375 mg via SUBCUTANEOUS

## 2012-08-16 ENCOUNTER — Ambulatory Visit (INDEPENDENT_AMBULATORY_CARE_PROVIDER_SITE_OTHER): Payer: Medicare PPO

## 2012-08-16 DIAGNOSIS — J45909 Unspecified asthma, uncomplicated: Secondary | ICD-10-CM

## 2012-08-23 ENCOUNTER — Ambulatory Visit: Payer: Medicare PPO | Admitting: Internal Medicine

## 2012-08-24 ENCOUNTER — Other Ambulatory Visit: Payer: Self-pay | Admitting: Internal Medicine

## 2012-08-24 NOTE — Telephone Encounter (Signed)
rx called into pharmacy

## 2012-08-24 NOTE — Telephone Encounter (Signed)
Okay #90 x 0 

## 2012-08-26 MED ORDER — OMALIZUMAB 150 MG ~~LOC~~ SOLR
375.0000 mg | Freq: Once | SUBCUTANEOUS | Status: AC
  Administered 2012-08-26: 375 mg via SUBCUTANEOUS

## 2012-08-27 ENCOUNTER — Encounter: Payer: Self-pay | Admitting: Gastroenterology

## 2012-08-30 ENCOUNTER — Ambulatory Visit (INDEPENDENT_AMBULATORY_CARE_PROVIDER_SITE_OTHER): Payer: Medicare PPO

## 2012-08-30 DIAGNOSIS — J45909 Unspecified asthma, uncomplicated: Secondary | ICD-10-CM

## 2012-08-31 MED ORDER — OMALIZUMAB 150 MG ~~LOC~~ SOLR
375.0000 mg | Freq: Once | SUBCUTANEOUS | Status: AC
  Administered 2012-08-31: 375 mg via SUBCUTANEOUS

## 2012-09-04 ENCOUNTER — Encounter: Payer: Self-pay | Admitting: Internal Medicine

## 2012-09-04 ENCOUNTER — Ambulatory Visit (INDEPENDENT_AMBULATORY_CARE_PROVIDER_SITE_OTHER): Payer: Medicare PPO | Admitting: Internal Medicine

## 2012-09-04 VITALS — BP 118/70 | HR 67 | Temp 97.7°F | Wt 224.0 lb

## 2012-09-04 DIAGNOSIS — E871 Hypo-osmolality and hyponatremia: Secondary | ICD-10-CM

## 2012-09-04 DIAGNOSIS — I8310 Varicose veins of unspecified lower extremity with inflammation: Secondary | ICD-10-CM

## 2012-09-04 DIAGNOSIS — N319 Neuromuscular dysfunction of bladder, unspecified: Secondary | ICD-10-CM

## 2012-09-04 DIAGNOSIS — I872 Venous insufficiency (chronic) (peripheral): Secondary | ICD-10-CM

## 2012-09-04 LAB — BASIC METABOLIC PANEL
BUN: 20 mg/dL (ref 6–23)
Calcium: 8.7 mg/dL (ref 8.4–10.5)
Chloride: 97 mEq/L (ref 96–112)
Creatinine, Ser: 0.7 mg/dL (ref 0.4–1.5)

## 2012-09-04 NOTE — Assessment & Plan Note (Signed)
Now has decided to stick with the foley catheter

## 2012-09-04 NOTE — Assessment & Plan Note (Signed)
Reassured that arterial circulation is fine No action needed Should inspect and protect feet to minimize chances of skin damage

## 2012-09-04 NOTE — Progress Notes (Signed)
Subjective:    Patient ID: [name], male    DOB: 06/04/1934, 78 y.o.   MRN: [id number]  HPI Here with wife Did try the self cath up to 5-6 per day Had large amount of incontinence inbetween caths and needed multiple attends Decided to go back to the Foley catheter  Saw Dr Gavin Potters Had swollen area on right arm---was able to aspirate some blood May need further attention  Here due to concern about his left foot Has had purplish discoloration distally that is now extended to medial side and plantar surface No pain with this Sometimes does have swelling but mostly controlled on the lasix  Current Outpatient Prescriptions on File Prior to Visit  Medication Sig Dispense Refill  . ALPRAZolam (XANAX) 0.5 MG tablet TAKE 1 TABLET BY MOUTH THREE TIMES DAILY AS NEEDED  90 tablet  0  . aspirin 81 MG tablet Take 81 mg by mouth daily.        . furosemide (LASIX) 20 MG tablet Take 20 mg by mouth daily.        Marland Kitchen gabapentin (NEURONTIN) 300 MG capsule Take 1 capsule (300 mg total) by mouth 3 (three) times daily.  90 capsule  3  . hydroxychloroquine (PLAQUENIL) 200 MG tablet Take 200 mg by mouth 2 (two) times daily.        Marland Kitchen ibuprofen (ADVIL,MOTRIN) 800 MG tablet Take 800 mg by mouth every 8 (eight) hours as needed.        . methotrexate 2.5 MG tablet Take 22.5 mg by mouth once a week.       . metoprolol succinate (TOPROL-XL) 50 MG 24 hr tablet TAKE 1 TABLET BY MOUTH EVERY DAY FOR HIGH BLOOD PRESSURE  90 tablet  0  . Multiple Vitamin (MULTIVITAMIN) tablet Take 1 tablet by mouth daily.        Marland Kitchen omalizumab (XOLAIR) 150 MG injection Inject 375 mg into the skin every 14 (fourteen) days.        Marland Kitchen omeprazole (PRILOSEC) 20 MG capsule Take 1 capsule (20 mg total) by mouth 2 (two) times daily.  60 capsule  11  . polyethylene glycol (GLYCOLAX) packet Take 17 g by mouth daily.       . pravastatin (PRAVACHOL) 20 MG tablet TAKE 1 TABLET BY MOUTH EVERY DAY  30 tablet  11  . solifenacin (VESICARE) 5 MG tablet  Take 5 mg by mouth daily. For bladder spasms       . VENTOLIN HFA 108 (90 BASE) MCG/ACT inhaler INHALE TWO PUFFS BY MOUTH UP TO 4 TIMES DAILY AS NEEDED  18 g  5    Allergies  Allergen Reactions  . Ciprofloxacin   . Citalopram Hydrobromide   . Lorazepam   . Paroxetine   . Ramipril   . Simvastatin     Past Medical History  Diagnosis Date  . CAD (coronary artery disease)   . Neurogenic bladder   . Asthma   . Chronic sinusitis   . Diverticulitis   . GERD (gastroesophageal reflux disease)   . Arthritis     rheumatoid  . Cancer     Prostate  . IBS (irritable bowel syndrome)   . Anxiety   . Neurodermatitis   . ED (erectile dysfunction)   . OSA (obstructive sleep apnea)   . Allergy   . Hypertension   . Depression   . Hx of adenomatous colonic polyps   . History of SIADH   . Hyponatremia     Past  Surgical History  Procedure Date  . Prostate surgery     PROSTATECTOMY  . Knee surgery     ARTHROSCOPY LEFT  . Nasal sinus surgery 2009    DEVIATED SEPTUM AND POLYPS  . Urethral stricture dilatation 02-2010    Dr.Cope    Family History  Problem Relation Age of Onset  . Heart disease Mother 51    heart failure  . Pneumonia Father 41    pnemonia    History   Social History  . Marital Status: Married    Spouse Name: N/A    Number of Children: 2  . Years of Education: N/A   Occupational History  . Radio station owner     retired   Social History Main Topics  . Smoking status: Never Smoker   . Smokeless tobacco: Never Used  . Alcohol Use: Yes  . Drug Use: No  . Sexually Active:    Other Topics Concern  . Not on file   Social History Narrative  . No narrative on file   Review of Systems Appetite is very good Weight is up 3# more Had 2 episodes of "delusions". Once in bed with wife and he had vision of her coming through the door as 78 year old. Had just been waking up. Other episode when he got up to get glass of water in kitchen, then checked the  calendar, then went to sit down again. Then shortly afterward, had sense of wanting water again and then wanted to check calendar again. Concerned about his sodium     Objective:   Physical Exam  Constitutional: He is oriented to person, place, and time. He appears well-developed and well-nourished. No distress.  Cardiovascular: Intact distal pulses.   Musculoskeletal: He exhibits edema.       Trace pedal edema Purplish discoloration along left forefoot and arch. Blanches Slight on right  Neurological: He is alert and oriented to person, place, and time.  Psychiatric: He has a normal mood and affect. His behavior is normal.          Assessment & Plan:

## 2012-09-04 NOTE — Assessment & Plan Note (Signed)
Still taking the furosemide daily Will recheck sodium due to his concern about the perceptual changes he recently noted

## 2012-09-05 ENCOUNTER — Encounter: Payer: Self-pay | Admitting: *Deleted

## 2012-09-10 ENCOUNTER — Other Ambulatory Visit: Payer: Self-pay | Admitting: Internal Medicine

## 2012-09-13 ENCOUNTER — Ambulatory Visit (INDEPENDENT_AMBULATORY_CARE_PROVIDER_SITE_OTHER): Payer: Medicare PPO

## 2012-09-13 ENCOUNTER — Ambulatory Visit: Payer: Self-pay | Admitting: *Deleted

## 2012-09-13 ENCOUNTER — Ambulatory Visit (INDEPENDENT_AMBULATORY_CARE_PROVIDER_SITE_OTHER): Payer: Medicare PPO | Admitting: Adult Health

## 2012-09-13 ENCOUNTER — Encounter: Payer: Self-pay | Admitting: Adult Health

## 2012-09-13 VITALS — BP 116/66 | HR 56 | Temp 96.7°F | Ht 71.0 in | Wt 226.0 lb

## 2012-09-13 DIAGNOSIS — J45909 Unspecified asthma, uncomplicated: Secondary | ICD-10-CM

## 2012-09-13 MED ORDER — ALBUTEROL SULFATE HFA 108 (90 BASE) MCG/ACT IN AERS: 2.0000 | INHALATION_SPRAY | RESPIRATORY_TRACT | Status: DC | PRN

## 2012-09-13 NOTE — Progress Notes (Signed)
Reviewed and agree with assessment/plan. 

## 2012-09-13 NOTE — Progress Notes (Signed)
  Subjective:    Patient ID: [name], male    DOB: November 20, 1934, 78 y.o.   MRN: [id number]  HPI 78 yo male with known hx of  asthma, allergic rhinitis on xolair.  09/13/2012 Follow up  Patient returns for a one-year followup for his asthma. Patient reports that he is doing exceptionally well. Continues on Xolair twice monthly. Has rare use of his Ventolin inhaler. Denies any wheezing, cough, nocturnal symptoms Has had no emergency room or hospitalizations  visits. Has had no prednisone tapers for lung issues   Review of Systems Constitutional:   No  weight loss, night sweats,  Fevers, chills,  +fatigue, or  lassitude.  HEENT:   No headaches,  Difficulty swallowing,  Tooth/dental problems, or  Sore throat,                No sneezing, itching, ear ache, nasal congestion, post nasal drip,   CV:  No chest pain,  Orthopnea, PND, swelling in lower extremities, anasarca, dizziness, palpitations, syncope.   GI  No heartburn, indigestion, abdominal pain, nausea, vomiting, diarrhea, change in bowel habits, loss of appetite, bloody stools.   Resp: No shortness of breath with exertion or at rest.  No excess mucus, no productive cough,  No non-productive cough,  No coughing up of blood.  No change in color of mucus.  No wheezing.  No chest wall deformity  Skin: no rash or lesions.  GU: no dysuria, change in color of urine, no urgency or frequency.  No flank pain, no hematuria   psych:  No change in mood or affect. No depression or anxiety.  No memory loss.         Objective:   Physical Exam GEN: A/Ox3; pleasant , NAD, elderly   HEENT:  [place]/AT,  EACs-clear, TMs-wnl, NOSE-clear, THROAT-clear, no lesions, no postnasal drip or exudate noted.   NECK:  Supple w/ fair ROM; no JVD; normal carotid impulses w/o bruits; no thyromegaly or nodules palpated; no lymphadenopathy.  RESP  Clear  P & A; w/o, wheezes/ rales/ or rhonchi.no accessory muscle use, no dullness to percussion  CARD:  RRR,  no m/r/g  , tr peripheral edema, pulses intact, no cyanosis or clubbing.  GI:   Soft & nt; nml bowel sounds; no organomegaly or masses detected.  Musco: Warm bil, no deformities or joint swelling noted.   Neuro: alert, no focal deficits noted.    Skin: Warm, no lesions or rashes         Assessment & Plan:

## 2012-09-13 NOTE — Patient Instructions (Addendum)
Continue on current regimen .  follow up Dr. Sood  In 6 months and As needed    

## 2012-09-13 NOTE — Assessment & Plan Note (Signed)
Well controlled on Xolair  Cont on current regimen  follow up 6 months and As needed

## 2012-09-14 MED ORDER — OMALIZUMAB 150 MG ~~LOC~~ SOLR
375.0000 mg | Freq: Once | SUBCUTANEOUS | Status: AC
  Administered 2012-09-14: 375 mg via SUBCUTANEOUS

## 2012-09-27 ENCOUNTER — Ambulatory Visit (INDEPENDENT_AMBULATORY_CARE_PROVIDER_SITE_OTHER): Payer: Medicare PPO

## 2012-09-27 DIAGNOSIS — J45909 Unspecified asthma, uncomplicated: Secondary | ICD-10-CM

## 2012-09-28 MED ORDER — OMALIZUMAB 150 MG ~~LOC~~ SOLR
375.0000 mg | Freq: Once | SUBCUTANEOUS | Status: AC
  Administered 2012-09-28: 375 mg via SUBCUTANEOUS

## 2012-10-02 ENCOUNTER — Other Ambulatory Visit: Payer: Self-pay | Admitting: Internal Medicine

## 2012-10-03 NOTE — Telephone Encounter (Signed)
Medication phoned to [facility] as instructed.

## 2012-10-03 NOTE — Telephone Encounter (Signed)
Okay #90 x 0 

## 2012-10-08 ENCOUNTER — Ambulatory Visit: Payer: Medicare PPO | Admitting: Internal Medicine

## 2012-10-11 ENCOUNTER — Ambulatory Visit: Payer: Medicare PPO | Admitting: Internal Medicine

## 2012-10-12 ENCOUNTER — Ambulatory Visit (INDEPENDENT_AMBULATORY_CARE_PROVIDER_SITE_OTHER): Payer: Medicare PPO

## 2012-10-12 DIAGNOSIS — J45909 Unspecified asthma, uncomplicated: Secondary | ICD-10-CM

## 2012-10-15 MED ORDER — OMALIZUMAB 150 MG ~~LOC~~ SOLR
375.0000 mg | Freq: Once | SUBCUTANEOUS | Status: AC
  Administered 2012-10-15: 375 mg via SUBCUTANEOUS

## 2012-10-29 ENCOUNTER — Ambulatory Visit: Payer: Medicare PPO

## 2012-10-30 ENCOUNTER — Ambulatory Visit (INDEPENDENT_AMBULATORY_CARE_PROVIDER_SITE_OTHER): Payer: Medicare PPO

## 2012-10-30 DIAGNOSIS — J45909 Unspecified asthma, uncomplicated: Secondary | ICD-10-CM

## 2012-10-30 MED ORDER — OMALIZUMAB 150 MG ~~LOC~~ SOLR
375.0000 mg | Freq: Once | SUBCUTANEOUS | Status: AC
  Administered 2012-10-30: 375 mg via SUBCUTANEOUS

## 2012-11-08 ENCOUNTER — Other Ambulatory Visit: Payer: Self-pay | Admitting: Internal Medicine

## 2012-11-08 NOTE — Telephone Encounter (Signed)
rx called into pharmacy

## 2012-11-08 NOTE — Telephone Encounter (Signed)
Okay #90 x 0 

## 2012-11-14 ENCOUNTER — Ambulatory Visit (INDEPENDENT_AMBULATORY_CARE_PROVIDER_SITE_OTHER): Payer: Medicare PPO

## 2012-11-14 DIAGNOSIS — J45909 Unspecified asthma, uncomplicated: Secondary | ICD-10-CM

## 2012-11-16 DIAGNOSIS — J45909 Unspecified asthma, uncomplicated: Secondary | ICD-10-CM

## 2012-11-16 MED ORDER — OMALIZUMAB 150 MG ~~LOC~~ SOLR
375.0000 mg | Freq: Once | SUBCUTANEOUS | Status: AC
  Administered 2012-11-16: 375 mg via SUBCUTANEOUS

## 2012-11-30 ENCOUNTER — Ambulatory Visit (INDEPENDENT_AMBULATORY_CARE_PROVIDER_SITE_OTHER): Payer: Medicare PPO

## 2012-11-30 DIAGNOSIS — J45909 Unspecified asthma, uncomplicated: Secondary | ICD-10-CM

## 2012-12-03 MED ORDER — OMALIZUMAB 150 MG ~~LOC~~ SOLR
375.0000 mg | Freq: Once | SUBCUTANEOUS | Status: AC
  Administered 2012-12-03: 375 mg via SUBCUTANEOUS

## 2012-12-04 ENCOUNTER — Other Ambulatory Visit: Payer: Self-pay | Admitting: Internal Medicine

## 2012-12-06 ENCOUNTER — Other Ambulatory Visit: Payer: Self-pay | Admitting: *Deleted

## 2012-12-06 NOTE — Telephone Encounter (Signed)
I think patient was on this in the hospital

## 2012-12-06 NOTE — Telephone Encounter (Signed)
Received refill request for lasix, I did not see where this med had been refilled by you before. Is it ok for refill?

## 2012-12-06 NOTE — Telephone Encounter (Signed)
He was on this for some time for hyponatremia I thought it was stopped Might be taking prn Okay to refill if he is taking it but just find out from him

## 2012-12-07 MED ORDER — FUROSEMIDE 20 MG PO TABS: 20.0000 mg | ORAL_TABLET | Freq: Every day | ORAL | Status: DC

## 2012-12-07 NOTE — Telephone Encounter (Signed)
.  left message to have patient return my call.  

## 2012-12-07 NOTE — Telephone Encounter (Signed)
Spoke with patient and advised results, he does take this, it was originally prescribed by [facility]. rx sent to pharmacy by e-script

## 2012-12-12 ENCOUNTER — Other Ambulatory Visit: Payer: Self-pay | Admitting: Internal Medicine

## 2012-12-12 NOTE — Telephone Encounter (Signed)
rx called into pharmacy

## 2012-12-12 NOTE — Telephone Encounter (Signed)
Okay #90 x 0 

## 2012-12-18 ENCOUNTER — Ambulatory Visit (INDEPENDENT_AMBULATORY_CARE_PROVIDER_SITE_OTHER): Payer: Medicare PPO

## 2012-12-18 DIAGNOSIS — J45909 Unspecified asthma, uncomplicated: Secondary | ICD-10-CM

## 2012-12-19 ENCOUNTER — Ambulatory Visit: Payer: Medicare PPO | Admitting: Internal Medicine

## 2012-12-19 MED ORDER — OMALIZUMAB 150 MG ~~LOC~~ SOLR
375.0000 mg | Freq: Once | SUBCUTANEOUS | Status: AC
  Administered 2012-12-19: 375 mg via SUBCUTANEOUS

## 2013-01-01 ENCOUNTER — Ambulatory Visit (INDEPENDENT_AMBULATORY_CARE_PROVIDER_SITE_OTHER): Payer: Medicare PPO | Admitting: Internal Medicine

## 2013-01-01 ENCOUNTER — Encounter: Payer: Self-pay | Admitting: Internal Medicine

## 2013-01-01 VITALS — BP 110/70 | HR 61 | Temp 97.5°F | Wt 228.0 lb

## 2013-01-01 DIAGNOSIS — E785 Hyperlipidemia, unspecified: Secondary | ICD-10-CM

## 2013-01-01 DIAGNOSIS — M069 Rheumatoid arthritis, unspecified: Secondary | ICD-10-CM

## 2013-01-01 DIAGNOSIS — F3289 Other specified depressive episodes: Secondary | ICD-10-CM

## 2013-01-01 DIAGNOSIS — Z8546 Personal history of malignant neoplasm of prostate: Secondary | ICD-10-CM

## 2013-01-01 DIAGNOSIS — I2510 Atherosclerotic heart disease of native coronary artery without angina pectoris: Secondary | ICD-10-CM

## 2013-01-01 DIAGNOSIS — F411 Generalized anxiety disorder: Secondary | ICD-10-CM

## 2013-01-01 DIAGNOSIS — I10 Essential (primary) hypertension: Secondary | ICD-10-CM

## 2013-01-01 DIAGNOSIS — E871 Hypo-osmolality and hyponatremia: Secondary | ICD-10-CM

## 2013-01-01 DIAGNOSIS — N319 Neuromuscular dysfunction of bladder, unspecified: Secondary | ICD-10-CM

## 2013-01-01 DIAGNOSIS — F329 Major depressive disorder, single episode, unspecified: Secondary | ICD-10-CM

## 2013-01-01 LAB — HEPATIC FUNCTION PANEL
ALT: 20 U/L (ref 0–53)
AST: 21 U/L (ref 0–37)
Bilirubin, Direct: 0.1 mg/dL (ref 0.0–0.3)
Total Bilirubin: 0.6 mg/dL (ref 0.3–1.2)
Total Protein: 5.8 g/dL — ABNORMAL LOW (ref 6.0–8.3)

## 2013-01-01 LAB — CBC WITH DIFFERENTIAL/PLATELET
Basophils Relative: 0.6 % (ref 0.0–3.0)
Eosinophils Absolute: 0.2 10*3/uL (ref 0.0–0.7)
Eosinophils Relative: 5.5 % — ABNORMAL HIGH (ref 0.0–5.0)
Hemoglobin: 13 g/dL (ref 13.0–17.0)
Lymphocytes Relative: 26.6 % (ref 12.0–46.0)
MCHC: 33.9 g/dL (ref 30.0–36.0)
MCV: 95.8 fl (ref 78.0–100.0)
Neutro Abs: 2.8 10*3/uL (ref 1.4–7.7)
Neutrophils Relative %: 60.3 % (ref 43.0–77.0)
RBC: 4 Mil/uL — ABNORMAL LOW (ref 4.22–5.81)
WBC: 4.6 10*3/uL (ref 4.5–10.5)

## 2013-01-01 LAB — BASIC METABOLIC PANEL
CO2: 31 mEq/L (ref 19–32)
Calcium: 8.7 mg/dL (ref 8.4–10.5)
Chloride: 102 mEq/L (ref 96–112)
Sodium: 138 mEq/L (ref 135–145)

## 2013-01-01 LAB — LIPID PANEL
LDL Cholesterol: 82 mg/dL (ref 0–99)
Total CHOL/HDL Ratio: 3

## 2013-01-01 MED ORDER — PRAVASTATIN SODIUM 20 MG PO TABS: 20.0000 mg | ORAL_TABLET | Freq: Every day | ORAL | Status: DC

## 2013-01-01 NOTE — Assessment & Plan Note (Signed)
No problems with statin Will recheck labs 

## 2013-01-01 NOTE — Assessment & Plan Note (Signed)
Still gets frustrated with disability but doing better No meds for this but has alprazolam for nerves

## 2013-01-01 NOTE — Assessment & Plan Note (Signed)
Used to the chronic Foley

## 2013-01-01 NOTE — Assessment & Plan Note (Signed)
Has been okay Still some fluid restriction but not as strict Still on furosemide

## 2013-01-01 NOTE — Assessment & Plan Note (Signed)
Seems to be quiet Hasn't seen cardiologist in some time but no current issues

## 2013-01-01 NOTE — Progress Notes (Signed)
Subjective:    Patient ID: [name], male    DOB: 14-Jul-1934, 79 y.o.   MRN: [id number]  HPI Has postponed 2 appointments because he has gained weight Still not able to lose any weight Takes low dose furosemide but off salt for a long time Does have appt with Dr Cherylann Ratel next week  Still with catheter vesicare helps prevent bladder spasms No consideration for future self caths now Did have a bladder infection recently---Rx by urologist  Asthma has been controlled Still on xolair Has rare awakening from sleep with mild SOB--relates to not keeping up with nasal irrigation No wheezing  Mood has been fairly good in general Happy to be on looser fluid restriction--still tries to keep to 1500cc Will have an alcoholic drink once in a while and he really enjoys that  No chest pain Has had some lower left flank pain--- this is not new (relates to IBS type symptoms) No edema No dizziness or syncope  Arthritis pain is better no MTX Gets by with just ibuprofen 400mg  tid in addition  Current Outpatient Prescriptions on File Prior to Visit  Medication Sig Dispense Refill  . albuterol (VENTOLIN HFA) 108 (90 BASE) MCG/ACT inhaler Inhale 2 puffs into the lungs every 4 (four) hours as needed for wheezing.  18 g  3  . ALPRAZolam (XANAX) 0.5 MG tablet TAKE 1 TABLET BY MOUTH THREE TIMES DAILY AS NEEDED  90 tablet  0  . aspirin 81 MG tablet Take 81 mg by mouth daily.        . Cholecalciferol (VITAMIN D3) 5000 UNITS CAPS Take 1 capsule by mouth daily.      . Coenzyme Q10 (CO Q 10) 100 MG CAPS Take 1 capsule by mouth daily.      . furosemide (LASIX) 20 MG tablet Take 1 tablet (20 mg total) by mouth daily.  30 tablet  1  . gabapentin (NEURONTIN) 300 MG capsule TAKE 1 CAPSULE BY MOUTH THREE TIMES DAILY  90 capsule  3  . hydroxychloroquine (PLAQUENIL) 200 MG tablet Take 200 mg by mouth 2 (two) times daily.       . methotrexate 2.5 MG tablet Take 22.5 mg by mouth once a week.       . metoprolol  succinate (TOPROL-XL) 50 MG 24 hr tablet TAKE 1 TABLET BY MOUTH EVERY DAY FOR HIGH BLOOD PRESSURE  90 tablet  3  . Multiple Vitamin (MULTIVITAMIN) tablet Take 1 tablet by mouth daily.        Marland Kitchen omalizumab (XOLAIR) 150 MG injection Inject 375 mg into the skin every 14 (fourteen) days.       Marland Kitchen omeprazole (PRILOSEC) 20 MG capsule Take 20 mg by mouth daily.      . polyethylene glycol (GLYCOLAX) packet Take 17 g by mouth daily.       . pravastatin (PRAVACHOL) 20 MG tablet Take 1 tablet (20 mg total) by mouth daily.  90 tablet  3  . PREVIDENT 5000 DRY MOUTH 1.1 % PSTE Use as directed      . solifenacin (VESICARE) 5 MG tablet Take 5 mg by mouth daily. For bladder spasms         Allergies  Allergen Reactions  . Ciprofloxacin Nausea And Vomiting    Headache  . Citalopram Hydrobromide   . Lorazepam     Adverse reaction  . Paroxetine Nausea Only  . Ramipril   . Simvastatin     Past Medical History  Diagnosis Date  . CAD (  coronary artery disease)   . Neurogenic bladder   . Asthma   . Chronic sinusitis   . Diverticulitis   . GERD (gastroesophageal reflux disease)   . Arthritis     rheumatoid  . Cancer     Prostate  . IBS (irritable bowel syndrome)   . Anxiety   . Neurodermatitis   . ED (erectile dysfunction)   . OSA (obstructive sleep apnea)   . Allergy   . Hypertension   . Depression   . Hx of adenomatous colonic polyps   . History of SIADH   . Hyponatremia     Past Surgical History  Procedure Date  . Prostate surgery     PROSTATECTOMY  . Knee surgery     ARTHROSCOPY LEFT  . Nasal sinus surgery 2009    DEVIATED SEPTUM AND POLYPS  . Urethral stricture dilatation 02-2010    Dr.Cope    Family History  Problem Relation Age of Onset  . Heart disease Mother 71    heart failure  . Pneumonia Father 48    pnemonia    History   Social History  . Marital Status: Married    Spouse Name: N/A    Number of Children: 2  . Years of Education: N/A   Occupational History  .  Radio station owner     retired   Social History Main Topics  . Smoking status: Never Smoker   . Smokeless tobacco: Never Used  . Alcohol Use: Yes  . Drug Use: No  . Sexually Active:    Other Topics Concern  . Not on file   Social History Narrative  . No narrative on file   Review of Systems Got another recall from GI for colonscopy ----reviewed this and we decided to defer Bowels are still not his "normal form" but does go regularly with the miralax. Occ looks like "mud"    Objective:   Physical Exam  Constitutional: He appears well-developed and well-nourished. No distress.  Neck: Normal range of motion. Neck supple. No thyromegaly present.  Cardiovascular: Normal rate, regular rhythm and normal heart sounds.  Exam reveals no gallop.   No murmur heard. Pulmonary/Chest: Effort normal and breath sounds normal. No respiratory distress. He has no wheezes. He has no rales.  Abdominal: Soft. There is no tenderness.  Musculoskeletal: He exhibits no edema and no tenderness.  Lymphadenopathy:    He has no cervical adenopathy.  Psychiatric: He has a normal mood and affect. His behavior is normal.          Assessment & Plan:

## 2013-01-01 NOTE — Assessment & Plan Note (Signed)
BP Readings from Last 3 Encounters:  01/01/13 110/70  09/13/12 116/66  09/04/12 118/70   Good control still

## 2013-01-01 NOTE — Assessment & Plan Note (Signed)
Is better Still has the xanax for as needed

## 2013-01-01 NOTE — Assessment & Plan Note (Signed)
Follows with Dr Gavin Potters Better on MTX

## 2013-01-02 ENCOUNTER — Ambulatory Visit (INDEPENDENT_AMBULATORY_CARE_PROVIDER_SITE_OTHER): Payer: Medicare PPO

## 2013-01-02 DIAGNOSIS — J45909 Unspecified asthma, uncomplicated: Secondary | ICD-10-CM

## 2013-01-04 MED ORDER — OMALIZUMAB 150 MG ~~LOC~~ SOLR
375.0000 mg | Freq: Once | SUBCUTANEOUS | Status: AC
  Administered 2013-01-04: 375 mg via SUBCUTANEOUS

## 2013-01-11 ENCOUNTER — Other Ambulatory Visit: Payer: Self-pay | Admitting: Internal Medicine

## 2013-01-14 NOTE — Telephone Encounter (Signed)
rx called into pharmacy

## 2013-01-14 NOTE — Telephone Encounter (Signed)
Okay #90 x 0 

## 2013-01-16 ENCOUNTER — Telehealth: Payer: Self-pay

## 2013-01-16 NOTE — Telephone Encounter (Signed)
Pt left v/m requesting replacement for advil for rheumatoid arthritis pain. Dr Cherylann Ratel does not want pt taking Advil due to hyponatremia. Dr Cherylann Ratel said possible med replacement could be Tramadol. Please advise.[facility] on [address].

## 2013-01-17 ENCOUNTER — Ambulatory Visit (INDEPENDENT_AMBULATORY_CARE_PROVIDER_SITE_OTHER): Payer: Medicare PPO

## 2013-01-17 ENCOUNTER — Ambulatory Visit: Payer: Medicare PPO

## 2013-01-17 DIAGNOSIS — J45909 Unspecified asthma, uncomplicated: Secondary | ICD-10-CM

## 2013-01-17 MED ORDER — TRAMADOL HCL 50 MG PO TABS: 50.0000 mg | ORAL_TABLET | Freq: Three times a day (TID) | ORAL | Status: DC | PRN

## 2013-01-17 NOTE — Telephone Encounter (Signed)
rx sent to pharmacy by e-script Left message with results on VM, advised pt to call if needed

## 2013-01-17 NOTE — Telephone Encounter (Signed)
Okay to send rx for tramadol 50mg  1 tid prn for pain #90 x 0 Have him call if it isn't effective or he has any sig side effects

## 2013-01-18 DIAGNOSIS — J45909 Unspecified asthma, uncomplicated: Secondary | ICD-10-CM

## 2013-01-18 MED ORDER — OMALIZUMAB 150 MG ~~LOC~~ SOLR
375.0000 mg | Freq: Once | SUBCUTANEOUS | Status: AC
  Administered 2013-01-18: 375 mg via SUBCUTANEOUS

## 2013-01-30 ENCOUNTER — Telehealth: Payer: Self-pay | Admitting: *Deleted

## 2013-01-30 NOTE — Telephone Encounter (Signed)
Pt calling asking if Tramadol is safe to take? Pt states he read the insert and he's concerned about the alcohol? Pt only drinks in the evening and was concerned about that

## 2013-01-30 NOTE — Telephone Encounter (Signed)
I knew about his alcohol and don't think the tramadol should be a problem with the amount he drinks (1-2 a day only)

## 2013-01-31 NOTE — Telephone Encounter (Signed)
Left detailed message on home VM, advised pt to call if any questions 

## 2013-02-01 ENCOUNTER — Ambulatory Visit: Payer: Medicare PPO

## 2013-02-04 ENCOUNTER — Ambulatory Visit (INDEPENDENT_AMBULATORY_CARE_PROVIDER_SITE_OTHER): Payer: Medicare PPO

## 2013-02-04 DIAGNOSIS — J45909 Unspecified asthma, uncomplicated: Secondary | ICD-10-CM

## 2013-02-06 MED ORDER — OMALIZUMAB 150 MG ~~LOC~~ SOLR
375.0000 mg | Freq: Once | SUBCUTANEOUS | Status: AC
  Administered 2013-02-06: 375 mg via SUBCUTANEOUS

## 2013-02-16 ENCOUNTER — Other Ambulatory Visit: Payer: Self-pay | Admitting: Internal Medicine

## 2013-02-18 NOTE — Telephone Encounter (Signed)
rx called into pharmacy

## 2013-02-18 NOTE — Telephone Encounter (Signed)
Okay #90 x 0 

## 2013-02-19 ENCOUNTER — Ambulatory Visit (INDEPENDENT_AMBULATORY_CARE_PROVIDER_SITE_OTHER): Payer: Medicare PPO

## 2013-02-19 DIAGNOSIS — J45909 Unspecified asthma, uncomplicated: Secondary | ICD-10-CM

## 2013-02-21 MED ORDER — OMALIZUMAB 150 MG ~~LOC~~ SOLR
375.0000 mg | Freq: Once | SUBCUTANEOUS | Status: AC
  Administered 2013-02-21: 375 mg via SUBCUTANEOUS

## 2013-03-06 ENCOUNTER — Ambulatory Visit (INDEPENDENT_AMBULATORY_CARE_PROVIDER_SITE_OTHER): Payer: Medicare PPO

## 2013-03-06 DIAGNOSIS — J45909 Unspecified asthma, uncomplicated: Secondary | ICD-10-CM

## 2013-03-06 MED ORDER — OMALIZUMAB 150 MG ~~LOC~~ SOLR
375.0000 mg | Freq: Once | SUBCUTANEOUS | Status: AC
  Administered 2013-03-06: 375 mg via SUBCUTANEOUS

## 2013-03-22 ENCOUNTER — Other Ambulatory Visit: Payer: Self-pay | Admitting: Internal Medicine

## 2013-03-22 ENCOUNTER — Ambulatory Visit (INDEPENDENT_AMBULATORY_CARE_PROVIDER_SITE_OTHER)
Admission: RE | Admit: 2013-03-22 | Discharge: 2013-03-22 | Disposition: A | Discharge status: Home / Self Care | Payer: Medicare PPO | Source: Ambulatory Visit | Attending: Pulmonary Disease | Admitting: Pulmonary Disease

## 2013-03-22 ENCOUNTER — Ambulatory Visit (INDEPENDENT_AMBULATORY_CARE_PROVIDER_SITE_OTHER): Payer: Medicare PPO

## 2013-03-22 ENCOUNTER — Encounter: Payer: Self-pay | Admitting: Pulmonary Disease

## 2013-03-22 ENCOUNTER — Ambulatory Visit (INDEPENDENT_AMBULATORY_CARE_PROVIDER_SITE_OTHER): Payer: Medicare PPO | Admitting: Pulmonary Disease

## 2013-03-22 VITALS — BP 124/76 | HR 67 | Temp 97.6°F | Ht 71.5 in | Wt 225.2 lb

## 2013-03-22 DIAGNOSIS — J45909 Unspecified asthma, uncomplicated: Secondary | ICD-10-CM

## 2013-03-22 DIAGNOSIS — J449 Chronic obstructive pulmonary disease, unspecified: Secondary | ICD-10-CM

## 2013-03-22 MED ORDER — OMALIZUMAB 150 MG ~~LOC~~ SOLR
375.0000 mg | Freq: Once | SUBCUTANEOUS | Status: AC
  Administered 2013-03-22: 375 mg via SUBCUTANEOUS

## 2013-03-22 NOTE — Patient Instructions (Signed)
Chest xray today >> will call with results Follow up in 6 months 

## 2013-03-22 NOTE — Progress Notes (Signed)
Chief Complaint  Patient presents with  . Follow-up    Pt states he has been doing fine. breathing has been doing fine. Pt has no complaints today  . Medication Refill    ventolin    History of Present Illness: [name] is a 79 y.o. male allergic asthma.  He has been doing well.  He is only on xolair.  He has not needed to use albuterol much.  He is not needing any other controller medications for his asthma.  His sinuses have been doing well also.    He remains on plaquenil and MTX for his RA per rheumatology.  He denies cough, wheeze, sputum, chest pain, or skin rash.   TESTS: 07/23/09 >> IgE 527.04 October 2009 >> Start Xolair PFT 04/07/10 >> FEV1 2.61(86%), FVC 3.60(78%), FEV1% 73, TLC 7.26(105%), DLCO 112%, no BD    [name]  has a past medical history of CAD (coronary artery disease); Neurogenic bladder; Asthma; Chronic sinusitis; Diverticulitis; GERD (gastroesophageal reflux disease); Arthritis; Cancer; IBS (irritable bowel syndrome); Anxiety; Neurodermatitis; ED (erectile dysfunction); OSA (obstructive sleep apnea); Allergy; Hypertension; Depression; adenomatous colonic polyps; History of SIADH; and Hyponatremia.  [name]  has past surgical history that includes Prostate surgery; Knee surgery; Nasal sinus surgery (2009); and Urethra dilation (02-2010).  Prior to Admission medications   Medication Sig Start Date End Date Taking? Authorizing Provider  albuterol (VENTOLIN HFA) 108 (90 BASE) MCG/ACT inhaler Inhale 2 puffs into the lungs every 4 (four) hours as needed for wheezing. 09/13/12  Yes Tammy S Parrett, NP  ALPRAZolam (XANAX) 0.5 MG tablet TAKE 1 TABLET BY MOUTH THREE TIMES DAILY AS NEEDED 02/16/13  Yes Karie Schwalbe, MD  aspirin 81 MG tablet Take 81 mg by mouth daily.     Yes Historical Provider, MD  Cholecalciferol (VITAMIN D3) 5000 UNITS CAPS Take 1 capsule by mouth daily.   Yes Historical Provider, MD  Coenzyme Q10 (CO Q 10) 100 MG CAPS Take 1 capsule  by mouth daily.   Yes Historical Provider, MD  folic acid (FOLVITE) 0.5 MG tablet Take 1 mg by mouth daily.   Yes Historical Provider, MD  furosemide (LASIX) 20 MG tablet TAKE 1 TABLET BY MOUTH EVERY DAY 02/16/13  Yes Karie Schwalbe, MD  gabapentin (NEURONTIN) 300 MG capsule TAKE 1 CAPSULE BY MOUTH THREE TIMES DAILY 12/04/12  Yes Karie Schwalbe, MD  hydroxychloroquine (PLAQUENIL) 200 MG tablet Take 200 mg by mouth 2 (two) times daily.    Yes Historical Provider, MD  methotrexate 2.5 MG tablet Take 22.5 mg by mouth once a week.    Yes Historical Provider, MD  metoprolol succinate (TOPROL-XL) 50 MG 24 hr tablet TAKE 1 TABLET BY MOUTH EVERY DAY FOR HIGH BLOOD PRESSURE 12/04/12  Yes Karie Schwalbe, MD  Multiple Vitamin (MULTIVITAMIN) tablet Take 1 tablet by mouth daily.     Yes Historical Provider, MD  omalizumab Geoffry Paradise) 150 MG injection Inject 375 mg into the skin every 14 (fourteen) days.    Yes Historical Provider, MD  omeprazole (PRILOSEC) 20 MG capsule Take 20 mg by mouth daily. 04/09/12  Yes Karie Schwalbe, MD  polyethylene glycol [facility]) packet Take 17 g by mouth daily.    Yes Historical Provider, MD  pravastatin (PRAVACHOL) 20 MG tablet Take 1 tablet (20 mg total) by mouth daily. 01/01/13  Yes Karie Schwalbe, MD  PREVIDENT 5000 DRY MOUTH 1.1 % PSTE Use as directed 09/02/12  Yes Historical Provider, MD  solifenacin (VESICARE) 5 MG tablet Take 5 mg by mouth daily. For bladder spasms    Yes Historical Provider, MD  traMADol (ULTRAM) 50 MG tablet Take 1 tablet (50 mg total) by mouth 3 (three) times daily as needed. 01/17/13  Yes Karie Schwalbe, MD    Allergies  Allergen Reactions  . Ciprofloxacin Nausea And Vomiting    Headache  . Citalopram Hydrobromide   . Lorazepam     Adverse reaction  . Paroxetine Nausea Only  . Ramipril   . Simvastatin      Physical Exam:  General - No distress ENT - No sinus tenderness, no oral exudate, no LAN Cardiac - s1s2 regular, no murmur Chest -  No wheeze/rales/dullness Back - No focal tenderness Abd - Soft, non-tender Ext - No edema Neuro - Normal strength Skin - No rashes Psych - normal mood, and behavior   Assessment/Plan:  Coralyn Helling, MD [facility] Pager:  [phone]

## 2013-03-22 NOTE — Telephone Encounter (Signed)
Okay #90 x 0 

## 2013-03-22 NOTE — Telephone Encounter (Signed)
rx called into pharmacy

## 2013-03-22 NOTE — Assessment & Plan Note (Addendum)
Stable on xolair.  Will repeat his CXR today.  Will defer pulmonary function testing for now.

## 2013-03-26 ENCOUNTER — Telehealth: Payer: Self-pay | Admitting: Pulmonary Disease

## 2013-03-26 NOTE — Telephone Encounter (Signed)
Dg Chest 2 View  03/22/2013  *RADIOLOGY REPORT*  Clinical Data: Shortness of breath, history of asthma.  CHEST - 2 VIEW  Comparison: 08/13/2009 and 05/12/2008 chest radiographs  Findings: The cardiomediastinal silhouette is unremarkable. Mild peribronchial thickening is unchanged. A small calcified granuloma within the right mid lung noted. There is no evidence of focal airspace disease, pulmonary edema, suspicious pulmonary nodule/mass, pleural effusion, or pneumothorax. No acute bony abnormalities are identified.  IMPRESSION: No evidence of acute cardiopulmonary disease.  Mild chronic peribronchial thickening.   Original Report Authenticated By: Harmon Pier, M.D.    Will have my nurse inform patient that chest xray is clear.  No change to current treatment plan.

## 2013-03-28 NOTE — Telephone Encounter (Signed)
I spoke with patient about results and he verbalized understanding and had no questions 

## 2013-03-30 ENCOUNTER — Other Ambulatory Visit: Payer: Self-pay | Admitting: Internal Medicine

## 2013-04-05 ENCOUNTER — Other Ambulatory Visit: Payer: Self-pay | Admitting: Internal Medicine

## 2013-04-08 ENCOUNTER — Ambulatory Visit: Payer: Medicare PPO

## 2013-04-09 ENCOUNTER — Ambulatory Visit (INDEPENDENT_AMBULATORY_CARE_PROVIDER_SITE_OTHER): Payer: Medicare PPO

## 2013-04-09 DIAGNOSIS — J45909 Unspecified asthma, uncomplicated: Secondary | ICD-10-CM

## 2013-04-09 MED ORDER — OMALIZUMAB 150 MG ~~LOC~~ SOLR
375.0000 mg | Freq: Once | SUBCUTANEOUS | Status: AC
  Administered 2013-04-09: 375 mg via SUBCUTANEOUS

## 2013-04-23 ENCOUNTER — Ambulatory Visit: Payer: Medicare PPO

## 2013-04-24 ENCOUNTER — Ambulatory Visit (INDEPENDENT_AMBULATORY_CARE_PROVIDER_SITE_OTHER): Payer: Medicare PPO

## 2013-04-24 DIAGNOSIS — J45909 Unspecified asthma, uncomplicated: Secondary | ICD-10-CM

## 2013-04-25 ENCOUNTER — Other Ambulatory Visit: Payer: Self-pay | Admitting: Internal Medicine

## 2013-04-26 ENCOUNTER — Other Ambulatory Visit: Payer: Self-pay | Admitting: *Deleted

## 2013-04-26 MED ORDER — OMALIZUMAB 150 MG ~~LOC~~ SOLR
375.0000 mg | Freq: Once | SUBCUTANEOUS | Status: AC
  Administered 2013-04-26: 375 mg via SUBCUTANEOUS

## 2013-04-26 MED ORDER — OMEPRAZOLE 20 MG PO CPDR: 20.0000 mg | DELAYED_RELEASE_CAPSULE | Freq: Every day | ORAL | Status: DC

## 2013-04-26 NOTE — Telephone Encounter (Signed)
Okay #90 x 0 

## 2013-04-26 NOTE — Telephone Encounter (Signed)
rx called into pharmacy

## 2013-05-03 ENCOUNTER — Other Ambulatory Visit: Payer: Self-pay | Admitting: Internal Medicine

## 2013-05-09 ENCOUNTER — Ambulatory Visit (INDEPENDENT_AMBULATORY_CARE_PROVIDER_SITE_OTHER): Payer: Medicare PPO

## 2013-05-09 DIAGNOSIS — J45909 Unspecified asthma, uncomplicated: Secondary | ICD-10-CM

## 2013-05-14 MED ORDER — OMALIZUMAB 150 MG ~~LOC~~ SOLR
375.0000 mg | Freq: Once | SUBCUTANEOUS | Status: AC
  Administered 2013-05-14: 375 mg via SUBCUTANEOUS

## 2013-05-20 ENCOUNTER — Ambulatory Visit (INDEPENDENT_AMBULATORY_CARE_PROVIDER_SITE_OTHER): Payer: Medicare PPO | Admitting: Internal Medicine

## 2013-05-20 ENCOUNTER — Telehealth: Payer: Self-pay | Admitting: Family Medicine

## 2013-05-20 ENCOUNTER — Encounter: Payer: Self-pay | Admitting: Internal Medicine

## 2013-05-20 VITALS — BP 120/68 | HR 65 | Temp 97.8°F | Wt 220.0 lb

## 2013-05-20 DIAGNOSIS — S8010XA Contusion of unspecified lower leg, initial encounter: Secondary | ICD-10-CM | POA: Insufficient documentation

## 2013-05-20 DIAGNOSIS — S8012XA Contusion of left lower leg, initial encounter: Secondary | ICD-10-CM

## 2013-05-20 NOTE — Assessment & Plan Note (Signed)
Not sure of the trauma that caused this Below the level of the knee brace No infection Discussed the time course for resolution--could take 1 month or more

## 2013-05-20 NOTE — Telephone Encounter (Signed)
[facility] Triage Call Report Triage Record Num: [id number] Operator: Sula Rumple Patient Name: [name] Call Date & Time: 05/19/2013 4:54:37PM Patient Phone: [phone] PCP: Tillman Abide Patient Gender: Male PCP Fax : [phone] Patient DOB: September 11, 1934 Practice Name: [facility] Reason for Call: Caller: [name]/Patient; PCP: Tillman Abide (Family Practice); CB#: [phone]; Call regarding Bump left leg and bruise; Pt callling on 05/19/13 states he has a bump 4 inches below on the left leg/bum is the size of 1/2 walnut/has been present 2-3 days/warm to touch and slightly red/pt is afebrile/will call in AM for appt/ All emergent sx ruled out per Leg Non Injury Protocol/See Provider with in 24 hours per New sighns and symptoms of local infection Protocol(s) Used: Leg Non-Injury Recommended Outcome per Protocol: See Provider within 24 hours Reason for Outcome: New signs and symptoms of local infection Care Advice: Apply local moist heat (such as a warm, wet wash cloth covered with plastic wrap) to the area for 15-20 minutes every 2-3 hours while awake. ~ 05/19/2013 5:10:31PM Page 1 of 1 [id number]

## 2013-05-20 NOTE — Progress Notes (Signed)
Subjective:    Patient ID: [name], male    DOB: 08/22/1934, 79 y.o.   MRN: [id number]  HPI Here with wife  Got a bruise about 10 days ago--lateral upper left calf Then noticed a lump above the bruise 2 days ago Wears elastic brace on knee---but no straps No pain but mild tenderness if he pushes on it No bruising down to foot--but had some left ankle pain yesterday  Current Outpatient Prescriptions on File Prior to Visit  Medication Sig Dispense Refill  . albuterol (VENTOLIN HFA) 108 (90 BASE) MCG/ACT inhaler Inhale 2 puffs into the lungs every 4 (four) hours as needed for wheezing.  18 g  3  . ALPRAZolam (XANAX) 0.5 MG tablet TAKE 1 TABLET BY MOUTH THREE TIMES DAILY AS NEEDED  90 tablet  0  . aspirin 81 MG tablet Take 81 mg by mouth daily.        . Cholecalciferol (VITAMIN D3) 5000 UNITS CAPS Take 1 capsule by mouth daily.      . Coenzyme Q10 (CO Q 10) 100 MG CAPS Take 1 capsule by mouth daily.      . folic acid (FOLVITE) 0.5 MG tablet Take 1 mg by mouth daily.      . furosemide (LASIX) 20 MG tablet TAKE 1 TABLET BY MOUTH EVERY DAY  30 tablet  11  . gabapentin (NEURONTIN) 300 MG capsule TAKE 1 CAPSULE BY MOUTH THREE TIMES DAILY  90 capsule  11  . hydroxychloroquine (PLAQUENIL) 200 MG tablet Take 200 mg by mouth 2 (two) times daily.       . methotrexate 2.5 MG tablet Take 22.5 mg by mouth once a week.       . metoprolol succinate (TOPROL-XL) 50 MG 24 hr tablet TAKE 1 TABLET BY MOUTH EVERY DAY FOR HIGH BLOOD PRESSURE  90 tablet  3  . Multiple Vitamin (MULTIVITAMIN) tablet Take 1 tablet by mouth daily.        Marland Kitchen omalizumab (XOLAIR) 150 MG injection Inject 375 mg into the skin every 14 (fourteen) days.       Marland Kitchen omeprazole (PRILOSEC) 20 MG capsule Take 1 capsule (20 mg total) by mouth daily.  90 capsule  3  . polyethylene glycol (GLYCOLAX) packet Take 17 g by mouth daily.       . pravastatin (PRAVACHOL) 20 MG tablet Take 1 tablet (20 mg total) by mouth daily.  90 tablet  3  . PREVIDENT  5000 DRY MOUTH 1.1 % PSTE Use as directed      . solifenacin (VESICARE) 5 MG tablet Take 5 mg by mouth daily. For bladder spasms       . traMADol (ULTRAM) 50 MG tablet TAKE 1 TABLET BY MOUTH THREE TIMES DAILY AS NEEDED  90 tablet  0   No current facility-administered medications on file prior to visit.    Allergies  Allergen Reactions  . Ciprofloxacin Nausea And Vomiting    Headache  . Citalopram Hydrobromide   . Lorazepam     Adverse reaction  . Paroxetine Nausea Only  . Ramipril   . Simvastatin     Past Medical History  Diagnosis Date  . CAD (coronary artery disease)   . Neurogenic bladder   . Asthma   . Chronic sinusitis   . Diverticulitis   . GERD (gastroesophageal reflux disease)   . Arthritis     rheumatoid  . Cancer     Prostate  . IBS (irritable bowel syndrome)   . Anxiety   .  Neurodermatitis   . ED (erectile dysfunction)   . OSA (obstructive sleep apnea)   . Allergy   . Hypertension   . Depression   . Hx of adenomatous colonic polyps   . History of SIADH   . Hyponatremia     Past Surgical History  Procedure Laterality Date  . Prostate surgery      PROSTATECTOMY  . Knee surgery      ARTHROSCOPY LEFT  . Nasal sinus surgery  2009    DEVIATED SEPTUM AND POLYPS  . Urethral stricture dilatation  02-2010    Dr.Cope    Family History  Problem Relation Age of Onset  . Heart disease Mother 60    heart failure  . Pneumonia Father 34    pnemonia    History   Social History  . Marital Status: Married    Spouse Name: N/A    Number of Children: 2  . Years of Education: N/A   Occupational History  . Radio station owner     retired   Social History Main Topics  . Smoking status: Never Smoker   . Smokeless tobacco: Never Used  . Alcohol Use: Yes  . Drug Use: No  . Sexually Active:    Other Topics Concern  . Not on file   Social History Narrative  . No narrative on file   Review of Systems Chronic knee pain---gets cortisone shots  intermittently (and synvisc-- or similar) No fever    Objective:   Physical Exam  Constitutional: He appears well-developed and well-nourished. No distress.  Musculoskeletal:  ~2-3cm firm, non tender mass on lateral left calf 5-6cm horizontal bruise (only ~1cm wide) below this with some slight seepage down Ecchymosis on lateral left ankle (from gravity)          Assessment & Plan:

## 2013-05-20 NOTE — Telephone Encounter (Signed)
Pt coming in today at 11:30

## 2013-05-20 NOTE — Telephone Encounter (Signed)
Please check on him You can add him on at 4:45PM if he wants to come in

## 2013-05-23 ENCOUNTER — Ambulatory Visit: Payer: Medicare PPO

## 2013-05-23 ENCOUNTER — Other Ambulatory Visit: Payer: Self-pay | Admitting: Internal Medicine

## 2013-05-23 NOTE — Telephone Encounter (Signed)
Okay #90 x 0 

## 2013-05-24 ENCOUNTER — Ambulatory Visit (INDEPENDENT_AMBULATORY_CARE_PROVIDER_SITE_OTHER): Payer: Medicare PPO

## 2013-05-24 DIAGNOSIS — J45909 Unspecified asthma, uncomplicated: Secondary | ICD-10-CM

## 2013-05-24 NOTE — Telephone Encounter (Signed)
rx called into pharmacy

## 2013-05-29 MED ORDER — OMALIZUMAB 150 MG ~~LOC~~ SOLR
375.0000 mg | Freq: Once | SUBCUTANEOUS | Status: AC
  Administered 2013-05-29: 375 mg via SUBCUTANEOUS

## 2013-06-07 ENCOUNTER — Other Ambulatory Visit: Payer: Self-pay | Admitting: Internal Medicine

## 2013-06-10 ENCOUNTER — Ambulatory Visit: Payer: Medicare PPO

## 2013-06-10 NOTE — Telephone Encounter (Signed)
rx sent to pharmacy by e-script  

## 2013-06-10 NOTE — Telephone Encounter (Signed)
Okay #90 x 0 

## 2013-06-10 NOTE — Telephone Encounter (Signed)
Ok to refill 

## 2013-06-12 ENCOUNTER — Ambulatory Visit (INDEPENDENT_AMBULATORY_CARE_PROVIDER_SITE_OTHER): Payer: Medicare PPO

## 2013-06-12 DIAGNOSIS — J45909 Unspecified asthma, uncomplicated: Secondary | ICD-10-CM

## 2013-06-17 MED ORDER — OMALIZUMAB 150 MG ~~LOC~~ SOLR
375.0000 mg | Freq: Once | SUBCUTANEOUS | Status: AC
  Administered 2013-06-17: 375 mg via SUBCUTANEOUS

## 2013-06-25 ENCOUNTER — Other Ambulatory Visit: Payer: Self-pay | Admitting: Internal Medicine

## 2013-06-26 NOTE — Telephone Encounter (Signed)
Okay #90 x 0 

## 2013-06-26 NOTE — Telephone Encounter (Signed)
rx called into pharmacy

## 2013-06-27 ENCOUNTER — Ambulatory Visit (INDEPENDENT_AMBULATORY_CARE_PROVIDER_SITE_OTHER): Payer: Medicare PPO

## 2013-06-27 DIAGNOSIS — J45909 Unspecified asthma, uncomplicated: Secondary | ICD-10-CM

## 2013-07-03 MED ORDER — OMALIZUMAB 150 MG ~~LOC~~ SOLR
375.0000 mg | Freq: Once | SUBCUTANEOUS | Status: AC
  Administered 2013-07-03: 375 mg via SUBCUTANEOUS

## 2013-07-11 ENCOUNTER — Encounter: Payer: Self-pay | Admitting: Radiology

## 2013-07-12 ENCOUNTER — Encounter: Payer: Self-pay | Admitting: Internal Medicine

## 2013-07-12 ENCOUNTER — Other Ambulatory Visit: Payer: Self-pay | Admitting: Internal Medicine

## 2013-07-12 ENCOUNTER — Ambulatory Visit (INDEPENDENT_AMBULATORY_CARE_PROVIDER_SITE_OTHER): Payer: Medicare PPO

## 2013-07-12 ENCOUNTER — Ambulatory Visit (INDEPENDENT_AMBULATORY_CARE_PROVIDER_SITE_OTHER): Payer: Medicare PPO | Admitting: Internal Medicine

## 2013-07-12 VITALS — BP 110/60 | HR 64 | Temp 98.3°F | Ht 71.0 in | Wt 221.0 lb

## 2013-07-12 DIAGNOSIS — J45909 Unspecified asthma, uncomplicated: Secondary | ICD-10-CM

## 2013-07-12 DIAGNOSIS — F411 Generalized anxiety disorder: Secondary | ICD-10-CM

## 2013-07-12 DIAGNOSIS — I10 Essential (primary) hypertension: Secondary | ICD-10-CM

## 2013-07-12 DIAGNOSIS — I2510 Atherosclerotic heart disease of native coronary artery without angina pectoris: Secondary | ICD-10-CM

## 2013-07-12 DIAGNOSIS — Z0000 Encounter for general adult medical examination without abnormal findings: Secondary | ICD-10-CM

## 2013-07-12 NOTE — Assessment & Plan Note (Signed)
BP Readings from Last 3 Encounters:  07/12/13 110/60  05/20/13 120/68  03/22/13 124/76   Good control Lab Results  Component Value Date   CREATININE 0.6 01/01/2013

## 2013-07-12 NOTE — Progress Notes (Signed)
Subjective:    Patient ID: [name], male    DOB: 1934-06-13, 79 y.o.   MRN: [id number]  HPI Here for physical PSA yearly now Had colonoscopy in 2007  Chronic indwelling Foley Lots of burning and trouble with it Spasms still continue despite the vesicare Wants to consider suprapubic catheter  Seeing Dr Ernest Pine about his knee Both are bad but left is the worst May want to consider TKR since he is fairly medically stable now  Saw Dr Cherylann Ratel recently Sodium is normal for a while Continues on low dose furosemide  Asthma has been quiet Continues on the [employer]  Current Outpatient Prescriptions on File Prior to Visit  Medication Sig Dispense Refill  . albuterol (VENTOLIN HFA) 108 (90 BASE) MCG/ACT inhaler Inhale 2 puffs into the lungs every 4 (four) hours as needed for wheezing.  18 g  3  . ALPRAZolam (XANAX) 0.5 MG tablet TAKE 1 TABLET BY MOUTH THREE TIMES DAILY AS NEEDED  90 tablet  0  . aspirin 81 MG tablet Take 81 mg by mouth daily.        . Cholecalciferol (VITAMIN D3) 5000 UNITS CAPS Take 1 capsule by mouth daily.      . Coenzyme Q10 (CO Q 10) 100 MG CAPS Take 1 capsule by mouth daily.      . furosemide (LASIX) 20 MG tablet TAKE 1 TABLET BY MOUTH EVERY DAY  30 tablet  11  . gabapentin (NEURONTIN) 300 MG capsule TAKE 1 CAPSULE BY MOUTH THREE TIMES DAILY  90 capsule  11  . hydroxychloroquine (PLAQUENIL) 200 MG tablet Take 200 mg by mouth 2 (two) times daily.       . methotrexate 2.5 MG tablet Take 25 mg by mouth once a week.       . metoprolol succinate (TOPROL-XL) 50 MG 24 hr tablet TAKE 1 TABLET BY MOUTH EVERY DAY FOR HIGH BLOOD PRESSURE  90 tablet  3  . Multiple Vitamin (MULTIVITAMIN) tablet Take 1 tablet by mouth daily.        Marland Kitchen omalizumab (XOLAIR) 150 MG injection Inject 375 mg into the skin every 14 (fourteen) days.       Marland Kitchen omeprazole (PRILOSEC) 20 MG capsule Take 1 capsule (20 mg total) by mouth daily.  90 capsule  3  . polyethylene glycol (GLYCOLAX) packet Take 34 g by  mouth daily.       . pravastatin (PRAVACHOL) 20 MG tablet Take 1 tablet (20 mg total) by mouth daily.  90 tablet  3  . PREVIDENT 5000 DRY MOUTH 1.1 % PSTE Use as directed      . solifenacin (VESICARE) 5 MG tablet Take 5 mg by mouth daily. For bladder spasms        No current facility-administered medications on file prior to visit.    Allergies  Allergen Reactions  . Ciprofloxacin Nausea And Vomiting    Headache  . Citalopram Hydrobromide   . Lorazepam     Adverse reaction  . Paroxetine Nausea Only  . Ramipril   . Simvastatin     Past Medical History  Diagnosis Date  . CAD (coronary artery disease)   . Neurogenic bladder   . Asthma   . Chronic sinusitis   . Diverticulitis   . GERD (gastroesophageal reflux disease)   . Arthritis     rheumatoid  . Cancer     Prostate  . IBS (irritable bowel syndrome)   . Anxiety   . Neurodermatitis   . ED (  erectile dysfunction)   . OSA (obstructive sleep apnea)   . Allergy   . Hypertension   . Depression   . Hx of adenomatous colonic polyps   . History of SIADH   . Hyponatremia     Past Surgical History  Procedure Laterality Date  . Prostate surgery      PROSTATECTOMY  . Knee surgery      ARTHROSCOPY LEFT  . Nasal sinus surgery  2009    DEVIATED SEPTUM AND POLYPS  . Urethral stricture dilatation  02-2010    Dr.Cope    Family History  Problem Relation Age of Onset  . Heart disease Mother 5    heart failure  . Pneumonia Father 31    pnemonia    History   Social History  . Marital Status: Married    Spouse Name: N/A    Number of Children: 2  . Years of Education: N/A   Occupational History  . Radio station owner     retired   Social History Main Topics  . Smoking status: Never Smoker   . Smokeless tobacco: Never Used  . Alcohol Use: Yes  . Drug Use: No  . Sexual Activity:    Other Topics Concern  . Not on file   Social History Narrative   Not sure about a living will or health care POA   Wife should  make health care decisions for him   Would accept resuscitation attempts   Would consider tube feeds   Review of Systems  Constitutional: Negative for unexpected weight change.       Wears seat belt  HENT: Positive for hearing loss and dental problem. Negative for tinnitus.        Lost a tooth recently---follows with Dr Allayne Butcher (has had a lot of work)  Eyes: Negative for visual disturbance.       No diplopia or unilateral vision loss Early cataracts--not ready for surgery  Respiratory: Positive for shortness of breath. Negative for cough and chest tightness.        Will very rarely awaken SOB---Dr Craige Cotta thinks it may be the remnants of his apnea  Cardiovascular: Positive for leg swelling. Negative for chest pain and palpitations.  Gastrointestinal: Positive for abdominal pain. Negative for nausea and vomiting.       No satisfied with his bowels. Usually goes every day but has to strain. Increased pudding like stool every 3-4 days and then he feels more cleared out Some vague pain in his lower quadrants. Appetite is fine  Genitourinary: Positive for dysuria.       Has uncomfortable sensation with voiding after the furosemide  Musculoskeletal: Negative for back pain and arthralgias.  Allergic/Immunologic: Positive for environmental allergies. Negative for immunocompromised state.  Neurological: Positive for headaches. Negative for dizziness, syncope and light-headedness.       Occ headaches upon awakening  Psychiatric/Behavioral: Positive for sleep disturbance. The patient is nervous/anxious.        Takes the xanax at night and usually early AM (and then he can get a bit more sleep)       Objective:   Physical Exam  Constitutional: He is oriented to person, place, and time. He appears well-developed and well-nourished. No distress.  HENT:  Mouth/Throat: Oropharynx is clear and moist. No oropharyngeal exudate.  Eyes: Conjunctivae and EOM are normal. Pupils are equal, round, and  reactive to light.  Neck: Normal range of motion. Neck supple. No thyromegaly present.  Cardiovascular: Normal rate, regular rhythm,  normal heart sounds and intact distal pulses.  Exam reveals no gallop.   No murmur heard. Faint distal pulses  Pulmonary/Chest: Effort normal and breath sounds normal. No respiratory distress. He has no wheezes. He has no rales.  Abdominal: Soft. There is no tenderness.  Musculoskeletal: He exhibits edema.  Trace edema  Lymphadenopathy:    He has no cervical adenopathy.  Neurological: He is alert and oriented to person, place, and time.  Skin: No rash noted.  Psychiatric: He has a normal mood and affect. His behavior is normal.  Usual anxiety and somatic concerns---but no change and his baseline          Assessment & Plan:

## 2013-07-12 NOTE — Assessment & Plan Note (Signed)
Has been quiet No changes needed 

## 2013-07-12 NOTE — Assessment & Plan Note (Signed)
Quiet on the [employer]

## 2013-07-12 NOTE — Assessment & Plan Note (Signed)
No cancer screening UTD with imms other than zostavax Counseling done

## 2013-07-12 NOTE — Assessment & Plan Note (Signed)
Chronic Does okay with his current meds

## 2013-07-15 NOTE — Telephone Encounter (Signed)
rx called into pharmacy

## 2013-07-15 NOTE — Telephone Encounter (Signed)
Okay #90 x 0 

## 2013-07-22 MED ORDER — OMALIZUMAB 150 MG ~~LOC~~ SOLR
375.0000 mg | Freq: Once | SUBCUTANEOUS | Status: AC
  Administered 2013-07-22: 375 mg via SUBCUTANEOUS

## 2013-07-30 ENCOUNTER — Ambulatory Visit (INDEPENDENT_AMBULATORY_CARE_PROVIDER_SITE_OTHER): Payer: Medicare PPO

## 2013-07-30 ENCOUNTER — Other Ambulatory Visit: Payer: Self-pay | Admitting: Internal Medicine

## 2013-07-30 DIAGNOSIS — J45909 Unspecified asthma, uncomplicated: Secondary | ICD-10-CM

## 2013-07-30 NOTE — Telephone Encounter (Signed)
Okay #90 x 0 

## 2013-07-30 NOTE — Telephone Encounter (Signed)
rx called into pharmacy

## 2013-08-02 MED ORDER — OMALIZUMAB 150 MG ~~LOC~~ SOLR
375.0000 mg | Freq: Once | SUBCUTANEOUS | Status: AC
  Administered 2013-08-02: 375 mg via SUBCUTANEOUS

## 2013-08-14 ENCOUNTER — Ambulatory Visit (INDEPENDENT_AMBULATORY_CARE_PROVIDER_SITE_OTHER): Payer: Medicare PPO

## 2013-08-14 DIAGNOSIS — J45909 Unspecified asthma, uncomplicated: Secondary | ICD-10-CM

## 2013-08-15 ENCOUNTER — Ambulatory Visit (INDEPENDENT_AMBULATORY_CARE_PROVIDER_SITE_OTHER): Payer: Medicare PPO

## 2013-08-15 DIAGNOSIS — J45909 Unspecified asthma, uncomplicated: Secondary | ICD-10-CM

## 2013-08-15 MED ORDER — OMALIZUMAB 150 MG ~~LOC~~ SOLR
375.0000 mg | Freq: Once | SUBCUTANEOUS | Status: AC
  Administered 2013-08-15: 375 mg via SUBCUTANEOUS

## 2013-08-18 ENCOUNTER — Other Ambulatory Visit: Payer: Self-pay | Admitting: Internal Medicine

## 2013-08-19 NOTE — Telephone Encounter (Signed)
Okay #90 x 0 

## 2013-08-19 NOTE — Telephone Encounter (Signed)
rx called into pharmacy

## 2013-08-26 ENCOUNTER — Telehealth: Payer: Self-pay | Admitting: Pulmonary Disease

## 2013-08-26 NOTE — Telephone Encounter (Signed)
Pt called into the allergy lab and spoke with Tammy. According to the pt, a new prior auth will need to be done on Xolair. He was only approved for the dates of 10/3/213 - 08/30/2013. A new prior auth form has been completed and will be faxed to [facility].  Pt is aware that this will faxed today. I will keep this message in my box and follow up on this in a few days.

## 2013-08-27 ENCOUNTER — Other Ambulatory Visit: Payer: Self-pay | Admitting: Internal Medicine

## 2013-08-28 NOTE — Telephone Encounter (Signed)
I checked Dr. Evlyn Courier inbox this morning. We have not received anything yet from [facility] about a decision on the pt's Xolair.

## 2013-08-28 NOTE — Telephone Encounter (Signed)
Pt is requesting an update on the Xolair & can be reached at [phone].  [name]

## 2013-08-29 NOTE — Telephone Encounter (Signed)
Returning [name]'s call can be reached at [phone].[name]

## 2013-08-29 NOTE — Telephone Encounter (Signed)
Okay #90 x 0 

## 2013-08-29 NOTE — Telephone Encounter (Signed)
LM with pt letting him know that we have not received anything from [facility] in regards to this. I am going to call the insurance company this afternoon.

## 2013-08-29 NOTE — Telephone Encounter (Signed)
Last filled 07/30/13 

## 2013-08-29 NOTE — Telephone Encounter (Signed)
Spoke with [employer]. I have got his Xolair approved 08/31/2013 - 08/31/2014. Authorization #: [id number].  Pt is aware of this information.

## 2013-08-30 NOTE — Telephone Encounter (Signed)
rx called into pharmacy

## 2013-09-02 ENCOUNTER — Ambulatory Visit (INDEPENDENT_AMBULATORY_CARE_PROVIDER_SITE_OTHER): Payer: Medicare PPO

## 2013-09-02 DIAGNOSIS — J45909 Unspecified asthma, uncomplicated: Secondary | ICD-10-CM

## 2013-09-04 ENCOUNTER — Ambulatory Visit (INDEPENDENT_AMBULATORY_CARE_PROVIDER_SITE_OTHER): Payer: Medicare PPO

## 2013-09-04 DIAGNOSIS — Z23 Encounter for immunization: Secondary | ICD-10-CM

## 2013-09-04 MED ORDER — OMALIZUMAB 150 MG ~~LOC~~ SOLR
375.0000 mg | Freq: Once | SUBCUTANEOUS | Status: AC
  Administered 2013-09-04: 375 mg via SUBCUTANEOUS

## 2013-09-17 ENCOUNTER — Ambulatory Visit (INDEPENDENT_AMBULATORY_CARE_PROVIDER_SITE_OTHER): Payer: Medicare PPO

## 2013-09-17 DIAGNOSIS — J45909 Unspecified asthma, uncomplicated: Secondary | ICD-10-CM

## 2013-09-18 DIAGNOSIS — J45909 Unspecified asthma, uncomplicated: Secondary | ICD-10-CM

## 2013-09-18 MED ORDER — OMALIZUMAB 150 MG ~~LOC~~ SOLR
375.0000 mg | Freq: Once | SUBCUTANEOUS | Status: AC
  Administered 2013-09-18: 375 mg via SUBCUTANEOUS

## 2013-09-23 ENCOUNTER — Other Ambulatory Visit: Payer: Self-pay | Admitting: Internal Medicine

## 2013-09-23 NOTE — Telephone Encounter (Signed)
Last filled 08/18/13

## 2013-09-24 NOTE — Telephone Encounter (Signed)
Okay #90 x 0 

## 2013-09-24 NOTE — Telephone Encounter (Signed)
rx called into pharmacy

## 2013-10-01 ENCOUNTER — Other Ambulatory Visit: Payer: Self-pay | Admitting: Internal Medicine

## 2013-10-01 NOTE — Telephone Encounter (Signed)
Last filled 08/27/13

## 2013-10-01 NOTE — Telephone Encounter (Signed)
rx called into pharmacy

## 2013-10-01 NOTE — Telephone Encounter (Signed)
Okay #90 x 0 

## 2013-10-02 ENCOUNTER — Ambulatory Visit (INDEPENDENT_AMBULATORY_CARE_PROVIDER_SITE_OTHER): Payer: Medicare PPO

## 2013-10-02 DIAGNOSIS — J45909 Unspecified asthma, uncomplicated: Secondary | ICD-10-CM

## 2013-10-03 MED ORDER — OMALIZUMAB 150 MG ~~LOC~~ SOLR
375.0000 mg | Freq: Once | SUBCUTANEOUS | Status: AC
  Administered 2013-10-03: 375 mg via SUBCUTANEOUS

## 2013-10-17 ENCOUNTER — Ambulatory Visit (INDEPENDENT_AMBULATORY_CARE_PROVIDER_SITE_OTHER): Payer: Medicare PPO

## 2013-10-17 DIAGNOSIS — J309 Allergic rhinitis, unspecified: Secondary | ICD-10-CM

## 2013-10-17 DIAGNOSIS — J45909 Unspecified asthma, uncomplicated: Secondary | ICD-10-CM

## 2013-10-18 ENCOUNTER — Ambulatory Visit: Payer: Medicare PPO

## 2013-10-21 MED ORDER — OMALIZUMAB 150 MG ~~LOC~~ SOLR
375.0000 mg | Freq: Once | SUBCUTANEOUS | Status: AC
  Administered 2013-10-21: 375 mg via SUBCUTANEOUS

## 2013-10-30 ENCOUNTER — Other Ambulatory Visit: Payer: Self-pay | Admitting: Internal Medicine

## 2013-10-30 ENCOUNTER — Encounter: Payer: Self-pay | Admitting: Pulmonary Disease

## 2013-10-30 ENCOUNTER — Ambulatory Visit (INDEPENDENT_AMBULATORY_CARE_PROVIDER_SITE_OTHER): Payer: Medicare PPO | Admitting: Pulmonary Disease

## 2013-10-30 VITALS — BP 120/74 | HR 60 | Ht 71.0 in | Wt 223.0 lb

## 2013-10-30 DIAGNOSIS — M069 Rheumatoid arthritis, unspecified: Secondary | ICD-10-CM

## 2013-10-30 DIAGNOSIS — J45909 Unspecified asthma, uncomplicated: Secondary | ICD-10-CM

## 2013-10-30 NOTE — Telephone Encounter (Signed)
Okay #90 x 0 

## 2013-10-30 NOTE — Patient Instructions (Signed)
Follow up in 6 months 

## 2013-10-30 NOTE — Progress Notes (Signed)
Chief Complaint  Patient presents with  . COPD    Breathing has been doing well. Denies SOB, coughing or chest tightness.    History of Present Illness: [name] is a 79 y.o. male allergic asthma.  His breathing has been doing well.  He is tolerating xolair well.  He is not having cough, wheeze, or sputum.  His limitation to activity is related to his arthritis.    He may need knee replacement >> this would happen in Spring of 2015.    He remains on MTX for his arthritis.  He is not having any trouble with his sinuses.  TESTS: 07/23/09 >> IgE 527.04 October 2009 >> Start Xolair PFT 04/07/10 >> FEV1 2.61(86%), FVC 3.60(78%), FEV1% 73, TLC 7.26(105%), DLCO 112%, no BD    MACKSON BOTZ  has a past medical history of CAD (coronary artery disease); Neurogenic bladder; Asthma; Chronic sinusitis; Diverticulitis; GERD (gastroesophageal reflux disease); Arthritis; Cancer; IBS (irritable bowel syndrome); Anxiety; Neurodermatitis; ED (erectile dysfunction); OSA (obstructive sleep apnea); Allergy; Hypertension; Depression; adenomatous colonic polyps; History of SIADH; and Hyponatremia.  HAEDEN HUDOCK  has past surgical history that includes Prostate surgery; Knee surgery; Nasal sinus surgery (2009); and Urethra dilation (02-2010).  Prior to Admission medications   Medication Sig Start Date End Date Taking? Authorizing Provider  albuterol (VENTOLIN HFA) 108 (90 BASE) MCG/ACT inhaler Inhale 2 puffs into the lungs every 4 (four) hours as needed for wheezing. 09/13/12  Yes Tammy S Parrett, NP  ALPRAZolam (XANAX) 0.5 MG tablet TAKE 1 TABLET BY MOUTH THREE TIMES DAILY AS NEEDED 02/16/13  Yes Karie Schwalbe, MD  aspirin 81 MG tablet Take 81 mg by mouth daily.     Yes Historical Provider, MD  Cholecalciferol (VITAMIN D3) 5000 UNITS CAPS Take 1 capsule by mouth daily.   Yes Historical Provider, MD  Coenzyme Q10 (CO Q 10) 100 MG CAPS Take 1 capsule by mouth daily.   Yes Historical Provider, MD  folic  acid (FOLVITE) 0.5 MG tablet Take 1 mg by mouth daily.   Yes Historical Provider, MD  furosemide (LASIX) 20 MG tablet TAKE 1 TABLET BY MOUTH EVERY DAY 02/16/13  Yes Karie Schwalbe, MD  gabapentin (NEURONTIN) 300 MG capsule TAKE 1 CAPSULE BY MOUTH THREE TIMES DAILY 12/04/12  Yes Karie Schwalbe, MD  hydroxychloroquine (PLAQUENIL) 200 MG tablet Take 200 mg by mouth 2 (two) times daily.    Yes Historical Provider, MD  methotrexate 2.5 MG tablet Take 22.5 mg by mouth once a week.    Yes Historical Provider, MD  metoprolol succinate (TOPROL-XL) 50 MG 24 hr tablet TAKE 1 TABLET BY MOUTH EVERY DAY FOR HIGH BLOOD PRESSURE 12/04/12  Yes Karie Schwalbe, MD  Multiple Vitamin (MULTIVITAMIN) tablet Take 1 tablet by mouth daily.     Yes Historical Provider, MD  omalizumab Geoffry Paradise) 150 MG injection Inject 375 mg into the skin every 14 (fourteen) days.    Yes Historical Provider, MD  omeprazole (PRILOSEC) 20 MG capsule Take 20 mg by mouth daily. 04/09/12  Yes Karie Schwalbe, MD  polyethylene glycol [facility]) packet Take 17 g by mouth daily.    Yes Historical Provider, MD  pravastatin (PRAVACHOL) 20 MG tablet Take 1 tablet (20 mg total) by mouth daily. 01/01/13  Yes Karie Schwalbe, MD  PREVIDENT 5000 DRY MOUTH 1.1 % PSTE Use as directed 09/02/12  Yes Historical Provider, MD  solifenacin (VESICARE) 5 MG tablet Take 5 mg by mouth daily. For bladder  spasms    Yes Historical Provider, MD  traMADol (ULTRAM) 50 MG tablet Take 1 tablet (50 mg total) by mouth 3 (three) times daily as needed. 01/17/13  Yes Karie Schwalbe, MD    Allergies  Allergen Reactions  . Ciprofloxacin Nausea And Vomiting    Headache  . Citalopram Hydrobromide   . Lorazepam     Adverse reaction  . Paroxetine Nausea Only  . Ramipril   . Simvastatin      Physical Exam:  General - No distress ENT - No sinus tenderness, no oral exudate, no LAN Cardiac - s1s2 regular, no murmur Chest - No wheeze/rales/dullness Back - No focal  tenderness Abd - Soft, non-tender Ext - No edema Neuro - Normal strength Skin - No rashes Psych - normal mood, and behavior   Assessment/Plan:  Coralyn Helling, MD  Pulmonary/Critical Care/Sleep Pager:  [phone]

## 2013-10-30 NOTE — Telephone Encounter (Signed)
Received refill request electronically. Last office visit 07/12/13. Last refill 09/23/13. Is it okay to refill medication?

## 2013-10-30 NOTE — Telephone Encounter (Signed)
Called to [facility] [address] [place]. 

## 2013-10-30 NOTE — Assessment & Plan Note (Signed)
He may need knee replacement.  Advised him to contact our office if his surgical team needs information regarding his asthma control prior to his surgery.

## 2013-10-30 NOTE — Assessment & Plan Note (Signed)
He has done very well with xolair.  Will continue same.

## 2013-11-01 ENCOUNTER — Ambulatory Visit (INDEPENDENT_AMBULATORY_CARE_PROVIDER_SITE_OTHER): Payer: Medicare PPO

## 2013-11-01 ENCOUNTER — Encounter: Payer: Self-pay | Admitting: Pulmonary Disease

## 2013-11-01 DIAGNOSIS — J45909 Unspecified asthma, uncomplicated: Secondary | ICD-10-CM

## 2013-11-01 MED ORDER — OMALIZUMAB 150 MG ~~LOC~~ SOLR
375.0000 mg | Freq: Once | SUBCUTANEOUS | Status: AC
  Administered 2013-11-01: 375 mg via SUBCUTANEOUS

## 2013-11-03 ENCOUNTER — Other Ambulatory Visit: Payer: Self-pay | Admitting: Internal Medicine

## 2013-11-04 NOTE — Telephone Encounter (Signed)
Rx called to pharmacy as instructed. 

## 2013-11-04 NOTE — Telephone Encounter (Signed)
Okay #90 x 0 

## 2013-11-04 NOTE — Telephone Encounter (Signed)
Received refill request electronically. Last refill 10/01/13 # 90. Is it okay to refill medication?

## 2013-11-18 ENCOUNTER — Ambulatory Visit (INDEPENDENT_AMBULATORY_CARE_PROVIDER_SITE_OTHER): Payer: Medicare PPO

## 2013-11-18 DIAGNOSIS — J45909 Unspecified asthma, uncomplicated: Secondary | ICD-10-CM

## 2013-11-19 MED ORDER — OMALIZUMAB 150 MG ~~LOC~~ SOLR
375.0000 mg | Freq: Once | SUBCUTANEOUS | Status: AC
  Administered 2013-11-19: 375 mg via SUBCUTANEOUS

## 2013-11-29 ENCOUNTER — Other Ambulatory Visit: Payer: Self-pay | Admitting: Internal Medicine

## 2013-12-03 ENCOUNTER — Ambulatory Visit (INDEPENDENT_AMBULATORY_CARE_PROVIDER_SITE_OTHER): Payer: Medicare PPO

## 2013-12-03 DIAGNOSIS — J45909 Unspecified asthma, uncomplicated: Secondary | ICD-10-CM

## 2013-12-04 MED ORDER — OMALIZUMAB 150 MG ~~LOC~~ SOLR
375.0000 mg | Freq: Once | SUBCUTANEOUS | Status: AC
  Administered 2013-12-04: 375 mg via SUBCUTANEOUS

## 2013-12-05 ENCOUNTER — Other Ambulatory Visit: Payer: Self-pay | Admitting: Internal Medicine

## 2013-12-05 NOTE — Telephone Encounter (Signed)
Last filled 12/20/12

## 2013-12-05 NOTE — Telephone Encounter (Signed)
Okay #90 x 0 

## 2013-12-05 NOTE — Telephone Encounter (Signed)
rx called into pharmacy

## 2013-12-08 ENCOUNTER — Other Ambulatory Visit: Payer: Self-pay | Admitting: Internal Medicine

## 2013-12-09 NOTE — Telephone Encounter (Signed)
rx called into pharmacy

## 2013-12-09 NOTE — Telephone Encounter (Signed)
Okay #90 x 0 

## 2013-12-09 NOTE — Telephone Encounter (Signed)
Last filled 11/03/13 

## 2013-12-18 ENCOUNTER — Ambulatory Visit (INDEPENDENT_AMBULATORY_CARE_PROVIDER_SITE_OTHER): Payer: Medicare PPO

## 2013-12-18 ENCOUNTER — Ambulatory Visit: Payer: Medicare PPO

## 2013-12-18 DIAGNOSIS — J45909 Unspecified asthma, uncomplicated: Secondary | ICD-10-CM

## 2013-12-19 MED ORDER — OMALIZUMAB 150 MG ~~LOC~~ SOLR
375.0000 mg | Freq: Once | SUBCUTANEOUS | Status: AC
  Administered 2013-12-19: 375 mg via SUBCUTANEOUS

## 2013-12-24 ENCOUNTER — Other Ambulatory Visit: Payer: Self-pay | Admitting: Internal Medicine

## 2013-12-24 NOTE — Telephone Encounter (Signed)
Yes Find out why the request came so soon---sometimes it is the pharmacy

## 2013-12-24 NOTE — Telephone Encounter (Signed)
12/05/13, too soon?

## 2013-12-24 NOTE — Telephone Encounter (Signed)
Left message on home VM asking about the refill and why it's needed so soon, advised pt to return my call.

## 2013-12-27 NOTE — Telephone Encounter (Signed)
You can refill #90 x 0 on Monday 2/2 He needs an appt to discuss if he feels he needs to increase the dose (or have the orthopedist let me know that he increased the dose)

## 2013-12-27 NOTE — Telephone Encounter (Signed)
Spoke with patient and advised results Will call in on Monday.

## 2013-12-27 NOTE — Telephone Encounter (Signed)
Pt left vm saying he has been taking Tramadol 4xday for pain instead of the prescribed TID.  He is indeed requesting a refill and says he is completely out of medication.

## 2013-12-30 MED ORDER — TRAMADOL HCL 50 MG PO TABS: 50.0000 mg | ORAL_TABLET | Freq: Three times a day (TID) | ORAL | Status: DC | PRN

## 2013-12-30 NOTE — Addendum Note (Signed)
Addended by: Despina Hidden on: 12/30/2013 04:26 PM   Modules accepted: Orders

## 2013-12-30 NOTE — Telephone Encounter (Signed)
rx called into pharmacy

## 2013-12-31 ENCOUNTER — Other Ambulatory Visit: Payer: Self-pay | Admitting: Internal Medicine

## 2014-01-06 ENCOUNTER — Other Ambulatory Visit: Payer: Self-pay | Admitting: Internal Medicine

## 2014-01-06 NOTE — Telephone Encounter (Signed)
Last filled 12/08/13

## 2014-01-07 NOTE — Telephone Encounter (Signed)
Phoned in to pharmacy. 

## 2014-01-07 NOTE — Telephone Encounter (Signed)
Okay #90 x 0 

## 2014-01-09 ENCOUNTER — Ambulatory Visit (INDEPENDENT_AMBULATORY_CARE_PROVIDER_SITE_OTHER): Payer: Medicare PPO

## 2014-01-09 ENCOUNTER — Ambulatory Visit: Payer: Medicare PPO

## 2014-01-09 DIAGNOSIS — J45909 Unspecified asthma, uncomplicated: Secondary | ICD-10-CM

## 2014-01-15 ENCOUNTER — Ambulatory Visit: Payer: Medicare PPO | Admitting: Internal Medicine

## 2014-01-24 ENCOUNTER — Ambulatory Visit (INDEPENDENT_AMBULATORY_CARE_PROVIDER_SITE_OTHER): Payer: Medicare PPO

## 2014-01-24 DIAGNOSIS — J45909 Unspecified asthma, uncomplicated: Secondary | ICD-10-CM

## 2014-01-24 MED ORDER — OMALIZUMAB 150 MG ~~LOC~~ SOLR
375.0000 mg | Freq: Once | SUBCUTANEOUS | Status: AC
  Administered 2014-01-24: 375 mg via SUBCUTANEOUS

## 2014-01-28 MED ORDER — OMALIZUMAB 150 MG ~~LOC~~ SOLR
375.0000 mg | Freq: Once | SUBCUTANEOUS | Status: AC
  Administered 2014-01-28: 375 mg via SUBCUTANEOUS

## 2014-01-29 ENCOUNTER — Encounter: Payer: Self-pay | Admitting: Internal Medicine

## 2014-01-29 ENCOUNTER — Ambulatory Visit (INDEPENDENT_AMBULATORY_CARE_PROVIDER_SITE_OTHER): Payer: Medicare PPO | Admitting: Internal Medicine

## 2014-01-29 ENCOUNTER — Telehealth: Payer: Self-pay | Admitting: Internal Medicine

## 2014-01-29 VITALS — BP 110/60 | HR 62 | Temp 98.6°F | Wt 228.0 lb

## 2014-01-29 DIAGNOSIS — M1990 Unspecified osteoarthritis, unspecified site: Secondary | ICD-10-CM

## 2014-01-29 DIAGNOSIS — J45909 Unspecified asthma, uncomplicated: Secondary | ICD-10-CM

## 2014-01-29 DIAGNOSIS — Z8546 Personal history of malignant neoplasm of prostate: Secondary | ICD-10-CM

## 2014-01-29 DIAGNOSIS — E785 Hyperlipidemia, unspecified: Secondary | ICD-10-CM

## 2014-01-29 DIAGNOSIS — F411 Generalized anxiety disorder: Secondary | ICD-10-CM

## 2014-01-29 DIAGNOSIS — M069 Rheumatoid arthritis, unspecified: Secondary | ICD-10-CM

## 2014-01-29 DIAGNOSIS — I10 Essential (primary) hypertension: Secondary | ICD-10-CM

## 2014-01-29 LAB — CBC WITH DIFFERENTIAL/PLATELET
BASOS ABS: 0 10*3/uL (ref 0.0–0.1)
Basophils Relative: 0.7 % (ref 0.0–3.0)
EOS ABS: 0.3 10*3/uL (ref 0.0–0.7)
Eosinophils Relative: 4.4 % (ref 0.0–5.0)
HEMATOCRIT: 40.4 % (ref 39.0–52.0)
Hemoglobin: 13.5 g/dL (ref 13.0–17.0)
LYMPHS ABS: 1.2 10*3/uL (ref 0.7–4.0)
Lymphocytes Relative: 20.9 % (ref 12.0–46.0)
MCHC: 33.4 g/dL (ref 30.0–36.0)
MCV: 100.3 fl — AB (ref 78.0–100.0)
Monocytes Absolute: 0.4 10*3/uL (ref 0.1–1.0)
Monocytes Relative: 6.5 % (ref 3.0–12.0)
Neutro Abs: 4 10*3/uL (ref 1.4–7.7)
Neutrophils Relative %: 67.5 % (ref 43.0–77.0)
PLATELETS: 170 10*3/uL (ref 150.0–400.0)
RBC: 4.03 Mil/uL — ABNORMAL LOW (ref 4.22–5.81)
RDW: 14.4 % (ref 11.5–14.6)
WBC: 5.9 10*3/uL (ref 4.5–10.5)

## 2014-01-29 LAB — COMPREHENSIVE METABOLIC PANEL
ALK PHOS: 53 U/L (ref 39–117)
ALT: 19 U/L (ref 0–53)
AST: 26 U/L (ref 0–37)
Albumin: 3.6 g/dL (ref 3.5–5.2)
BILIRUBIN TOTAL: 0.8 mg/dL (ref 0.3–1.2)
BUN: 12 mg/dL (ref 6–23)
CO2: 31 mEq/L (ref 19–32)
Calcium: 9 mg/dL (ref 8.4–10.5)
Chloride: 99 mEq/L (ref 96–112)
Creatinine, Ser: 0.8 mg/dL (ref 0.4–1.5)
GFR: 102.4 mL/min (ref >60.00)
Glucose, Bld: 75 mg/dL (ref 70–99)
Potassium: 5 mEq/L (ref 3.5–5.1)
SODIUM: 137 meq/L (ref 135–145)
TOTAL PROTEIN: 6 g/dL (ref 6.0–8.3)

## 2014-01-29 LAB — TSH: TSH: 3.2 u[IU]/mL (ref 0.35–5.50)

## 2014-01-29 LAB — LIPID PANEL
CHOL/HDL RATIO: 3
CHOLESTEROL: 147 mg/dL (ref 0–200)
HDL: 51.3 mg/dL (ref >39.00)
LDL CALC: 84 mg/dL (ref 0–99)
Triglycerides: 61 mg/dL (ref 0.0–149.0)
VLDL: 12.2 mg/dL (ref 0.0–40.0)

## 2014-01-29 LAB — PSA: PSA: 0.01 ng/mL — ABNORMAL LOW (ref 0.10–4.00)

## 2014-01-29 LAB — T4, FREE: FREE T4: 1.03 ng/dL (ref 0.60–1.60)

## 2014-01-29 NOTE — Assessment & Plan Note (Signed)
BP Readings from Last 3 Encounters:  01/29/14 110/60  10/30/13 120/74  07/12/13 110/60   Good control

## 2014-01-29 NOTE — Assessment & Plan Note (Signed)
Uses the alprazolam with good effect

## 2014-01-29 NOTE — Telephone Encounter (Signed)
Relevant patient education assigned to patient using Emmi. ° °

## 2014-01-29 NOTE — Assessment & Plan Note (Signed)
Mostly in knees Off ibuprofen Will increase the tramadol up to 4 per day

## 2014-01-29 NOTE — Assessment & Plan Note (Signed)
Sees Dr Kernodle.   

## 2014-01-29 NOTE — Progress Notes (Signed)
Pre visit review using our clinic review tool, if applicable. No additional management support is needed unless otherwise documented below in the visit note. 

## 2014-01-29 NOTE — Progress Notes (Signed)
Subjective:    Patient ID: [name], male    DOB: [birth year], 80 y.o.   MRN: [id number]  HPI Here with wife  Still moving slowly Not ready for the TKR yet  Had respiratory illness about a month ago Had to go to [facility] Got steroid taper but didn't need antibiotic Continues on the xolair Has resolved Cough not present now  Still having bladder spasms Ongoing Foley catheter Has been offered botox--having procedure Friday Catheter removed and then replaced at times---that gives some relief at times  Off advil by nephrologist Now on tramadol tid-- needs a 4th some days  Ongoing anxiety Uses the alprazolam regularly Hasn't noted any cognitive problems on this  No chest pain or palpitations Rare nocturnal SOB  Current Outpatient Prescriptions on File Prior to Visit  Medication Sig Dispense Refill  . albuterol (VENTOLIN HFA) 108 (90 BASE) MCG/ACT inhaler Inhale 2 puffs into the lungs every 4 (four) hours as needed for wheezing.  18 g  3  . ALPRAZolam (XANAX) 0.5 MG tablet TAKE 1 TABLET BY MOUTH THREE TIMES DAILY AS NEEDED  90 tablet  0  . aspirin 81 MG tablet Take 81 mg by mouth daily.        . Cholecalciferol (VITAMIN D3) 5000 UNITS CAPS Take 1 capsule by mouth daily.      . Coenzyme Q10 (CO Q 10) 100 MG CAPS Take 1 capsule by mouth daily.      Marland Kitchen FOLIC ACID PO Take 3,154 mg by mouth daily.      . furosemide (LASIX) 20 MG tablet TAKE 1 TABLET BY MOUTH EVERY DAY  30 tablet  11  . gabapentin (NEURONTIN) 300 MG capsule TAKE 1 CAPSULE BY MOUTH THREE TIMES DAILY  90 capsule  11  . hydroxychloroquine (PLAQUENIL) 200 MG tablet Take 200 mg by mouth 2 (two) times daily.       . methotrexate 2.5 MG tablet Take 25 mg by mouth once a week.       . metoprolol succinate (TOPROL-XL) 50 MG 24 hr tablet TAKE 1 TABLET BY MOUTH EVERY DAY FOR HIGH BLOOD PRESSURE  90 tablet  0  . Multiple Vitamin (MULTIVITAMIN) tablet Take 1 tablet by mouth daily.        Marland Kitchen omalizumab (XOLAIR) 150 MG injection  Inject 375 mg into the skin every 14 (fourteen) days.       Marland Kitchen omeprazole (PRILOSEC) 20 MG capsule Take 1 capsule (20 mg total) by mouth daily.  90 capsule  3  . polyethylene glycol (GLYCOLAX) packet Take 34 g by mouth daily.       . pravastatin (PRAVACHOL) 20 MG tablet TAKE 1 TABLET BY MOUTH EVERY DAY  90 tablet  0  . PREVIDENT 5000 DRY MOUTH 1.1 % PSTE Use as directed      . solifenacin (VESICARE) 5 MG tablet Take 5 mg by mouth daily. For bladder spasms        No current facility-administered medications on file prior to visit.    Allergies  Allergen Reactions  . Ciprofloxacin Nausea And Vomiting    Headache  . Citalopram Hydrobromide   . Lorazepam     Adverse reaction  . Paroxetine Nausea Only  . Ramipril   . Simvastatin     Past Medical History  Diagnosis Date  . CAD (coronary artery disease)   . Neurogenic bladder   . Asthma   . Chronic sinusitis   . Diverticulitis   . GERD (gastroesophageal reflux  disease)   . Arthritis     rheumatoid  . Cancer     Prostate  . IBS (irritable bowel syndrome)   . Anxiety   . Neurodermatitis   . ED (erectile dysfunction)   . OSA (obstructive sleep apnea)   . Allergy   . Hypertension   . Depression   . Hx of adenomatous colonic polyps   . History of SIADH   . Hyponatremia     Past Surgical History  Procedure Laterality Date  . Prostate surgery      PROSTATECTOMY  . Knee surgery      ARTHROSCOPY LEFT  . Nasal sinus surgery  2009    DEVIATED SEPTUM AND POLYPS  . Urethral stricture dilatation  02-2010    Dr.Cope    Family History  Problem Relation Age of Onset  . Heart disease Mother 45    heart failure  . Pneumonia Father 45    pnemonia    History   Social History  . Marital Status: Married    Spouse Name: N/A    Number of Children: 2  . Years of Education: N/A   Occupational History  . Radio station owner     retired   Social History Main Topics  . Smoking status: Never Smoker   . Smokeless tobacco:  Never Used  . Alcohol Use: Yes  . Drug Use: No  . Sexual Activity:    Other Topics Concern  . Not on file   Social History Narrative   Not sure about a living will or health care POA   Wife should make health care decisions for him   Would accept resuscitation attempts   Would consider tube feeds   Review of Systems Still with pain in left hip and flank area Sleeps okay with alprazolam at night Weight is up some    Objective:   Physical Exam  Constitutional: He appears well-developed. No distress.  Neck: Normal range of motion. Neck supple. No thyromegaly present.  Cardiovascular: Normal rate, regular rhythm and normal heart sounds.  Exam reveals no gallop.   No murmur heard. Pulmonary/Chest: Effort normal and breath sounds normal. No respiratory distress. He has no wheezes. He has no rales.  Abdominal: Soft. There is no tenderness.  Musculoskeletal: He exhibits no edema.  Lymphadenopathy:    He has no cervical adenopathy.  Psychiatric: He has a normal mood and affect. His behavior is normal.          Assessment & Plan:

## 2014-01-29 NOTE — Assessment & Plan Note (Signed)
No problems with statin Due for labs 

## 2014-01-29 NOTE — Assessment & Plan Note (Signed)
Generally quiet on the [employer]

## 2014-02-10 ENCOUNTER — Other Ambulatory Visit: Payer: Self-pay | Admitting: *Deleted

## 2014-02-10 ENCOUNTER — Ambulatory Visit: Payer: Medicare PPO

## 2014-02-10 MED ORDER — TRAMADOL HCL 50 MG PO TABS: 50.0000 mg | ORAL_TABLET | Freq: Four times a day (QID) | ORAL | Status: DC | PRN

## 2014-02-10 MED ORDER — ALPRAZOLAM 0.5 MG PO TABS: ORAL_TABLET | ORAL | Status: DC

## 2014-02-10 NOTE — Telephone Encounter (Signed)
Patient in office with wife for appointment and requested refills.

## 2014-02-10 NOTE — Telephone Encounter (Signed)
Given to patient at wife's appointment

## 2014-02-12 ENCOUNTER — Ambulatory Visit (INDEPENDENT_AMBULATORY_CARE_PROVIDER_SITE_OTHER): Payer: Medicare PPO

## 2014-02-12 DIAGNOSIS — J45909 Unspecified asthma, uncomplicated: Secondary | ICD-10-CM

## 2014-02-14 MED ORDER — OMALIZUMAB 150 MG ~~LOC~~ SOLR
375.0000 mg | Freq: Once | SUBCUTANEOUS | Status: AC
  Administered 2014-02-14: 375 mg via SUBCUTANEOUS

## 2014-02-21 ENCOUNTER — Encounter: Payer: Self-pay | Admitting: Internal Medicine

## 2014-02-27 ENCOUNTER — Ambulatory Visit (INDEPENDENT_AMBULATORY_CARE_PROVIDER_SITE_OTHER): Payer: Medicare PPO

## 2014-02-27 DIAGNOSIS — J45909 Unspecified asthma, uncomplicated: Secondary | ICD-10-CM

## 2014-03-03 ENCOUNTER — Other Ambulatory Visit: Payer: Self-pay | Admitting: Internal Medicine

## 2014-03-07 MED ORDER — OMALIZUMAB 150 MG ~~LOC~~ SOLR
375.0000 mg | Freq: Once | SUBCUTANEOUS | Status: AC
  Administered 2014-03-07: 375 mg via SUBCUTANEOUS

## 2014-03-11 ENCOUNTER — Other Ambulatory Visit: Payer: Self-pay | Admitting: Internal Medicine

## 2014-03-12 MED ORDER — ALPRAZOLAM 0.5 MG PO TABS: 0.5000 mg | ORAL_TABLET | Freq: Three times a day (TID) | ORAL | Status: DC | PRN

## 2014-03-12 NOTE — Telephone Encounter (Signed)
LETVAK PATIENT, Please send back to me for call in Last filled 02/10/2014

## 2014-03-12 NOTE — Telephone Encounter (Signed)
rx called into pharmacy

## 2014-03-14 ENCOUNTER — Ambulatory Visit (INDEPENDENT_AMBULATORY_CARE_PROVIDER_SITE_OTHER): Payer: Medicare PPO

## 2014-03-14 DIAGNOSIS — J45909 Unspecified asthma, uncomplicated: Secondary | ICD-10-CM

## 2014-03-14 MED ORDER — OMALIZUMAB 150 MG ~~LOC~~ SOLR
375.0000 mg | Freq: Once | SUBCUTANEOUS | Status: AC
  Administered 2014-03-14: 375 mg via SUBCUTANEOUS

## 2014-03-24 ENCOUNTER — Other Ambulatory Visit: Payer: Self-pay | Admitting: Internal Medicine

## 2014-03-24 NOTE — Telephone Encounter (Signed)
Okay #120 x 0

## 2014-03-25 NOTE — Telephone Encounter (Signed)
Phoned in to pharmacy. 

## 2014-03-31 ENCOUNTER — Ambulatory Visit (INDEPENDENT_AMBULATORY_CARE_PROVIDER_SITE_OTHER): Payer: Medicare PPO

## 2014-03-31 DIAGNOSIS — J45909 Unspecified asthma, uncomplicated: Secondary | ICD-10-CM

## 2014-04-02 MED ORDER — OMALIZUMAB 150 MG ~~LOC~~ SOLR
375.0000 mg | Freq: Once | SUBCUTANEOUS | Status: AC
  Administered 2014-04-02: 375 mg via SUBCUTANEOUS

## 2014-04-08 ENCOUNTER — Other Ambulatory Visit: Payer: Self-pay | Admitting: Internal Medicine

## 2014-04-13 ENCOUNTER — Other Ambulatory Visit: Payer: Self-pay | Admitting: Family Medicine

## 2014-04-14 ENCOUNTER — Other Ambulatory Visit: Payer: Self-pay | Admitting: Internal Medicine

## 2014-04-14 NOTE — Telephone Encounter (Signed)
rx called into pharmacy

## 2014-04-14 NOTE — Telephone Encounter (Signed)
Okay #90 x 0 

## 2014-04-14 NOTE — Telephone Encounter (Signed)
Pt left v/m requesting refill alprazolam to [facility] [address] Pt is out of med and request refilled today.pt request cb when refilled.

## 2014-04-14 NOTE — Telephone Encounter (Signed)
Pt requesting medication refill. Last ov 01/2014 with f/u appt 06/2014. pls advise

## 2014-04-23 ENCOUNTER — Ambulatory Visit: Payer: Medicare PPO

## 2014-04-28 ENCOUNTER — Other Ambulatory Visit: Payer: Self-pay | Admitting: Internal Medicine

## 2014-04-28 ENCOUNTER — Ambulatory Visit (INDEPENDENT_AMBULATORY_CARE_PROVIDER_SITE_OTHER): Payer: Medicare PPO

## 2014-04-28 DIAGNOSIS — J45909 Unspecified asthma, uncomplicated: Secondary | ICD-10-CM

## 2014-04-30 MED ORDER — OMALIZUMAB 150 MG ~~LOC~~ SOLR
375.0000 mg | Freq: Once | SUBCUTANEOUS | Status: AC
  Administered 2014-04-30: 375 mg via SUBCUTANEOUS

## 2014-05-10 ENCOUNTER — Other Ambulatory Visit: Payer: Self-pay | Admitting: Internal Medicine

## 2014-05-12 NOTE — Telephone Encounter (Signed)
Last filled 04/14/14

## 2014-05-12 NOTE — Telephone Encounter (Signed)
Ok to phone in xanax 

## 2014-05-12 NOTE — Telephone Encounter (Signed)
Phoned in to pharmacy. 

## 2014-05-13 ENCOUNTER — Ambulatory Visit (INDEPENDENT_AMBULATORY_CARE_PROVIDER_SITE_OTHER): Payer: Commercial Managed Care - HMO

## 2014-05-13 DIAGNOSIS — J45909 Unspecified asthma, uncomplicated: Secondary | ICD-10-CM

## 2014-05-15 MED ORDER — OMALIZUMAB 150 MG ~~LOC~~ SOLR
375.0000 mg | Freq: Once | SUBCUTANEOUS | Status: AC
  Administered 2014-05-15: 375 mg via SUBCUTANEOUS

## 2014-05-19 ENCOUNTER — Other Ambulatory Visit: Payer: Self-pay | Admitting: Internal Medicine

## 2014-05-19 NOTE — Telephone Encounter (Signed)
03/25/14 

## 2014-05-19 NOTE — Telephone Encounter (Signed)
Okay #120 x 0

## 2014-05-19 NOTE — Telephone Encounter (Signed)
rx called into pharmacy

## 2014-05-23 ENCOUNTER — Emergency Department: Payer: Self-pay | Admitting: Emergency Medicine

## 2014-05-28 ENCOUNTER — Ambulatory Visit (INDEPENDENT_AMBULATORY_CARE_PROVIDER_SITE_OTHER): Payer: Medicare PPO

## 2014-05-28 DIAGNOSIS — J45909 Unspecified asthma, uncomplicated: Secondary | ICD-10-CM

## 2014-05-28 DIAGNOSIS — J4530 Mild persistent asthma, uncomplicated: Secondary | ICD-10-CM

## 2014-05-29 ENCOUNTER — Other Ambulatory Visit: Payer: Self-pay | Admitting: Internal Medicine

## 2014-05-29 MED ORDER — OMALIZUMAB 150 MG ~~LOC~~ SOLR
375.0000 mg | Freq: Once | SUBCUTANEOUS | Status: AC
  Administered 2014-05-29: 375 mg via SUBCUTANEOUS

## 2014-06-02 ENCOUNTER — Other Ambulatory Visit: Payer: Self-pay | Admitting: Internal Medicine

## 2014-06-09 ENCOUNTER — Other Ambulatory Visit: Payer: Self-pay | Admitting: *Deleted

## 2014-06-09 MED ORDER — ALPRAZOLAM 0.5 MG PO TABS: 0.5000 mg | ORAL_TABLET | Freq: Three times a day (TID) | ORAL | Status: DC | PRN

## 2014-06-09 NOTE — Telephone Encounter (Signed)
rx called into pharmacy

## 2014-06-09 NOTE — Telephone Encounter (Signed)
Pt states he is completely out, last refill 05/12/14

## 2014-06-09 NOTE — Telephone Encounter (Signed)
Okay #90 x 0 

## 2014-06-12 ENCOUNTER — Ambulatory Visit (INDEPENDENT_AMBULATORY_CARE_PROVIDER_SITE_OTHER): Payer: Medicare PPO

## 2014-06-12 DIAGNOSIS — J45909 Unspecified asthma, uncomplicated: Secondary | ICD-10-CM

## 2014-06-12 DIAGNOSIS — J4530 Mild persistent asthma, uncomplicated: Secondary | ICD-10-CM

## 2014-06-16 MED ORDER — OMALIZUMAB 150 MG ~~LOC~~ SOLR
375.0000 mg | Freq: Once | SUBCUTANEOUS | Status: AC
  Administered 2014-06-16: 375 mg via SUBCUTANEOUS

## 2014-06-29 ENCOUNTER — Other Ambulatory Visit: Payer: Self-pay | Admitting: Internal Medicine

## 2014-06-30 ENCOUNTER — Ambulatory Visit (INDEPENDENT_AMBULATORY_CARE_PROVIDER_SITE_OTHER): Payer: Medicare PPO

## 2014-06-30 DIAGNOSIS — J45909 Unspecified asthma, uncomplicated: Secondary | ICD-10-CM

## 2014-06-30 DIAGNOSIS — J4530 Mild persistent asthma, uncomplicated: Secondary | ICD-10-CM

## 2014-07-01 MED ORDER — OMALIZUMAB 150 MG ~~LOC~~ SOLR
375.0000 mg | Freq: Once | SUBCUTANEOUS | Status: AC
  Administered 2014-07-01: 375 mg via SUBCUTANEOUS

## 2014-07-04 ENCOUNTER — Other Ambulatory Visit: Payer: Self-pay | Admitting: Internal Medicine

## 2014-07-04 NOTE — Telephone Encounter (Signed)
rx called into pharmacy

## 2014-07-04 NOTE — Telephone Encounter (Signed)
05/19/14 

## 2014-07-04 NOTE — Telephone Encounter (Signed)
Okay #120 x 0

## 2014-07-07 ENCOUNTER — Other Ambulatory Visit: Payer: Self-pay | Admitting: Internal Medicine

## 2014-07-07 NOTE — Telephone Encounter (Signed)
Okay #90 x 0 

## 2014-07-07 NOTE — Telephone Encounter (Signed)
rx called into pharmacy

## 2014-07-07 NOTE — Telephone Encounter (Signed)
06/09/14 

## 2014-07-15 ENCOUNTER — Ambulatory Visit (INDEPENDENT_AMBULATORY_CARE_PROVIDER_SITE_OTHER): Payer: Medicare PPO

## 2014-07-15 DIAGNOSIS — J45909 Unspecified asthma, uncomplicated: Secondary | ICD-10-CM

## 2014-07-17 MED ORDER — OMALIZUMAB 150 MG ~~LOC~~ SOLR
375.0000 mg | Freq: Once | SUBCUTANEOUS | Status: AC
  Administered 2014-07-17: 375 mg via SUBCUTANEOUS

## 2014-07-28 ENCOUNTER — Encounter: Payer: Self-pay | Admitting: *Deleted

## 2014-07-28 ENCOUNTER — Encounter: Payer: Self-pay | Admitting: Internal Medicine

## 2014-07-28 ENCOUNTER — Ambulatory Visit (INDEPENDENT_AMBULATORY_CARE_PROVIDER_SITE_OTHER): Payer: Medicare PPO | Admitting: Internal Medicine

## 2014-07-28 VITALS — BP 120/80 | HR 68 | Temp 97.9°F | Ht 71.0 in | Wt 224.0 lb

## 2014-07-28 DIAGNOSIS — N319 Neuromuscular dysfunction of bladder, unspecified: Secondary | ICD-10-CM

## 2014-07-28 DIAGNOSIS — Z0000 Encounter for general adult medical examination without abnormal findings: Secondary | ICD-10-CM

## 2014-07-28 DIAGNOSIS — I2510 Atherosclerotic heart disease of native coronary artery without angina pectoris: Secondary | ICD-10-CM

## 2014-07-28 DIAGNOSIS — Z7189 Other specified counseling: Secondary | ICD-10-CM

## 2014-07-28 DIAGNOSIS — Z01818 Encounter for other preprocedural examination: Secondary | ICD-10-CM

## 2014-07-28 DIAGNOSIS — M069 Rheumatoid arthritis, unspecified: Secondary | ICD-10-CM

## 2014-07-28 DIAGNOSIS — F411 Generalized anxiety disorder: Secondary | ICD-10-CM

## 2014-07-28 DIAGNOSIS — I10 Essential (primary) hypertension: Secondary | ICD-10-CM

## 2014-07-28 DIAGNOSIS — Z23 Encounter for immunization: Secondary | ICD-10-CM

## 2014-07-28 MED ORDER — ALPRAZOLAM 0.5 MG PO TABS: 0.5000 mg | ORAL_TABLET | Freq: Three times a day (TID) | ORAL | Status: DC | PRN

## 2014-07-28 NOTE — Progress Notes (Signed)
Subjective:    Patient ID: [name], male    DOB: 1933/12/01, 80 y.o.   MRN: [id number]  HPI Here with wife For Medicare wellness and follow up Reviewed advanced directives Has had 1-2 falls--mostly due to left knee giving out Dr [name], Dr [name] for rheumatology, Dr [name], Dr [name] for pulmonary, Dr [name] for eyes No tobacco Regular alcohol Not able to exercise Able to walk slowly with rolling walker Showers alone, needs help getting dressed. Able to drive a little. Not depressed and limited by disability but not anhedonic No significant cognitive problems---mild memory issues Vision is good. Mild hearing problems--some high frequency loss. UTD with immunizations-- no zostavax due to MTX  Is finally due for left TKR by Dr [name] 9/23 at [facility] Will rarely awaken with SOB Overall doing well with xolair--no cough, ER visits, dyspnea attacks No chest pain No palpitations No dizziness or syncope Occasional mild edema  Still takes the furosemide Mostly still for past hyponatremia Sees Dr [name] once a year usually  Still has Foley catheter This will be permanent botox injection in bladder did help spasm---but now wearing off Will be getting repeat treatment Still with lots of leakage  RA is basically controlled Still on the methotrexate-- seems to have stabilized Got shot in left shoulder from Dr [name]  Still with lots of anxiety Using the alprazolam for sleep and then 1.5 bid otherwise  Current Outpatient Prescriptions on File Prior to Visit  Medication Sig Dispense Refill  . albuterol (VENTOLIN HFA) 108 (90 BASE) MCG/ACT inhaler Inhale 2 puffs into the lungs every 4 (four) hours as needed for wheezing.  18 g  3  . ALPRAZolam (XANAX) 0.5 MG tablet TAKE 1 TABLET BY MOUTH THREE TIMES DAILY AS NEEDED FOR ANXIETY  90 tablet  0  . aspirin 81 MG tablet Take 81 mg by mouth daily.        . Cholecalciferol (VITAMIN D3) 5000 UNITS CAPS Take 1 capsule by mouth daily.       . Coenzyme Q10 (CO Q 10) 100 MG CAPS Take 1 capsule by mouth daily.      Marland Kitchen FOLIC ACID PO Take 2,229 mg by mouth daily.      . furosemide (LASIX) 20 MG tablet TAKE 1 TABLET BY MOUTH EVERY DAY  30 tablet  11  . gabapentin (NEURONTIN) 300 MG capsule TAKE 1 CAPSULE BY MOUTH THREE TIMES DAILY  90 capsule  3  . hydroxychloroquine (PLAQUENIL) 200 MG tablet Take 200 mg by mouth 2 (two) times daily.       . methotrexate 2.5 MG tablet Take 25 mg by mouth once a week.       . metoprolol succinate (TOPROL-XL) 50 MG 24 hr tablet TAKE 1 TABLET BY MOUTH EVERY DAY  90 tablet  0  . Multiple Vitamin (MULTIVITAMIN) tablet Take 1 tablet by mouth daily.        Marland Kitchen omalizumab (XOLAIR) 150 MG injection Inject 375 mg into the skin every 14 (fourteen) days.       Marland Kitchen omeprazole (PRILOSEC) 20 MG capsule TAKE 1 CAPSULE BY MOUTH EVERY DAY  90 capsule  0  . polyethylene glycol (GLYCOLAX) packet Take 34 g by mouth daily.       . pravastatin (PRAVACHOL) 20 MG tablet TAKE 1 TABLET BY MOUTH EVERY DAY  90 tablet  3  . PREVIDENT 5000 DRY MOUTH 1.1 % PSTE Use as directed      . solifenacin (VESICARE) 5 MG  tablet Take 5 mg by mouth daily. For bladder spasms       . traMADol (ULTRAM) 50 MG tablet TAKE 1 TABLET BY MOUTH FOUR TIMES DAILY  120 tablet  0   No current facility-administered medications on file prior to visit.    Allergies  Allergen Reactions  . Ciprofloxacin Nausea And Vomiting    Headache  . Citalopram Hydrobromide   . Lorazepam     Adverse reaction  . Paroxetine Nausea Only  . Ramipril   . Simvastatin     Past Medical History  Diagnosis Date  . CAD (coronary artery disease)   . Neurogenic bladder   . Asthma   . Chronic sinusitis   . Diverticulitis   . GERD (gastroesophageal reflux disease)   . Arthritis     rheumatoid  . Cancer     Prostate  . IBS (irritable bowel syndrome)   . Anxiety   . Neurodermatitis   . ED (erectile dysfunction)   . OSA (obstructive sleep apnea)   . Allergy   .  Hypertension   . Depression   . Hx of adenomatous colonic polyps   . History of SIADH   . Hyponatremia     Past Surgical History  Procedure Laterality Date  . Prostate surgery      PROSTATECTOMY  . Knee surgery      ARTHROSCOPY LEFT  . Nasal sinus surgery  2009    DEVIATED SEPTUM AND POLYPS  . Urethral stricture dilatation  02-2010    Dr.Cope    Family History  Problem Relation Age of Onset  . Heart disease Mother 71    heart failure  . Pneumonia Father 97    pnemonia    History   Social History  . Marital Status: Married    Spouse Name: N/A    Number of Children: 2  . Years of Education: N/A   Occupational History  . Radio station owner     retired   Social History Main Topics  . Smoking status: Never Smoker   . Smokeless tobacco: Never Used  . Alcohol Use: Yes  . Drug Use: No  . Sexual Activity:    Other Topics Concern  . Not on file   Social History Narrative   Not sure about a living will or health care POA   Wife should make health care decisions for him   Would accept resuscitation attempts   Not sure about tube feeds   Review of Systems Sleeps okay with alprazolam Bowels are somewhat slow with the catheter. Does go every day but has to strain some. Some hemorrhoidal pain but no blood Ongoing joint pains    Objective:   Physical Exam  Constitutional: He is oriented to person, place, and time. He appears well-developed and well-nourished. No distress.  Neck: Normal range of motion. Neck supple. No thyromegaly present.  Cardiovascular: Normal rate, regular rhythm, normal heart sounds and intact distal pulses.  Exam reveals no gallop.   No murmur heard. Pulmonary/Chest: Effort normal and breath sounds normal. No respiratory distress. He has no wheezes. He has no rales.  Abdominal: Soft. There is no tenderness.  Musculoskeletal: He exhibits no edema.  Some mild purplish discoloration in toes--normal pulses  Lymphadenopathy:    He has no  cervical adenopathy.  Neurological: He is alert and oriented to person, place, and time.  President-- "Elyn Peers, Maudie Flakes, [place]" [phone] [id number] Recall 3/3  Skin: No rash noted.  Psychiatric: He has  a normal mood and affect. His behavior is normal.          Assessment & Plan:

## 2014-07-28 NOTE — Assessment & Plan Note (Signed)
Stable from pulmonary and cardiovascular standpoint Stable EKG---sinus with RBBB and LAHB. No ischemic changes Needs incentive spirometry and early mobilization Aggressive prophylaxis to prevent DVT (like xarelto or eliquis)

## 2014-07-28 NOTE — Assessment & Plan Note (Signed)
Ongoing issues Does okay with the alprazolam

## 2014-07-28 NOTE — Assessment & Plan Note (Signed)
Has been quiet No apparent contraindications to TKR

## 2014-07-28 NOTE — Assessment & Plan Note (Signed)
BP Readings from Last 3 Encounters:  07/28/14 120/80  01/29/14 110/60  10/30/13 120/74   Good control No changes needed

## 2014-07-28 NOTE — Assessment & Plan Note (Signed)
See social history 

## 2014-07-28 NOTE — Progress Notes (Signed)
Pre visit review using our clinic review tool, if applicable. No additional management support is needed unless otherwise documented below in the visit note. 

## 2014-07-28 NOTE — Assessment & Plan Note (Signed)
I have personally reviewed the Medicare Annual Wellness questionnaire and have noted 1. The patient's medical and social history 2. Their use of alcohol, tobacco or illicit drugs 3. Their current medications and supplements 4. The patient's functional ability including ADL's, fall risks, home safety risks and hearing or visual             impairment. 5. Diet and physical activities 6. Evidence for depression or mood disorders  The patients weight, height, BMI and visual acuity have been recorded in the chart I have made referrals, counseling and provided education to the patient based review of the above and I have provided the pt with a written personalized care plan for preventive services.  I have provided you with a copy of your personalized plan for preventive services. Please take the time to review along with your updated medication list.  No colon cancer screening due to age PSA yearly for history of cancer prevnar and flu today

## 2014-07-28 NOTE — Assessment & Plan Note (Signed)
Controlled with the MTX and hydroxychloroquine

## 2014-07-28 NOTE — Assessment & Plan Note (Signed)
Chronic Foley.  

## 2014-07-29 ENCOUNTER — Telehealth: Payer: Self-pay | Admitting: Internal Medicine

## 2014-07-29 HISTORY — PX: TOTAL KNEE ARTHROPLASTY: SHX125

## 2014-07-29 NOTE — Telephone Encounter (Signed)
Boy they are very literal--I stated no contraindications to the surgery  I am medically clearing him for surgery. If necessary, just fax copy of this note

## 2014-07-29 NOTE — Telephone Encounter (Signed)
Copy of phone note sent

## 2014-07-29 NOTE — Telephone Encounter (Signed)
[name] said they received your office note yesterday, but it didn't state that patient is cleared for surgery. [name] is requesting that you send her something stating patient is cleared for knee replacement surgery. Her fax number is [phone].

## 2014-07-30 ENCOUNTER — Ambulatory Visit (INDEPENDENT_AMBULATORY_CARE_PROVIDER_SITE_OTHER): Payer: Medicare PPO

## 2014-07-30 DIAGNOSIS — J45909 Unspecified asthma, uncomplicated: Secondary | ICD-10-CM

## 2014-07-30 DIAGNOSIS — J4530 Mild persistent asthma, uncomplicated: Secondary | ICD-10-CM

## 2014-08-06 ENCOUNTER — Ambulatory Visit: Payer: Self-pay | Admitting: General Practice

## 2014-08-06 LAB — CBC
HCT: 41.9 % (ref 40.0–52.0)
HGB: 14.1 g/dL (ref 13.0–18.0)
MCH: 33.5 pg (ref 26.0–34.0)
MCHC: 33.6 g/dL (ref 32.0–36.0)
MCV: 100 fL (ref 80–100)
PLATELETS: 152 10*3/uL (ref 150–440)
RBC: 4.2 10*6/uL — AB (ref 4.40–5.90)
RDW: 14.1 % (ref 11.5–14.5)
WBC: 6.6 10*3/uL (ref 3.8–10.6)

## 2014-08-06 LAB — URINALYSIS, COMPLETE
Bilirubin,UR: NEGATIVE
Blood: NEGATIVE
Glucose,UR: NEGATIVE mg/dL (ref 0–75)
KETONE: NEGATIVE
Nitrite: POSITIVE
Ph: 6 (ref 4.5–8.0)
Protein: 30
SPECIFIC GRAVITY: 1.021 (ref 1.003–1.030)
Squamous Epithelial: 1
WBC UR: 156 /HPF (ref 0–5)

## 2014-08-06 LAB — BASIC METABOLIC PANEL
Anion Gap: 3 — ABNORMAL LOW (ref 7–16)
BUN: 19 mg/dL — ABNORMAL HIGH (ref 7–18)
CO2: 34 mmol/L — AB (ref 21–32)
CREATININE: 0.77 mg/dL (ref 0.60–1.30)
Calcium, Total: 9.1 mg/dL (ref 8.5–10.1)
Chloride: 99 mmol/L (ref 98–107)
EGFR (Non-African Amer.): >60
Glucose: 98 mg/dL (ref 65–99)
OSMOLALITY: 274 (ref 275–301)
POTASSIUM: 4.3 mmol/L (ref 3.5–5.1)
Sodium: 136 mmol/L (ref 136–145)

## 2014-08-06 LAB — PROTIME-INR
INR: 1.1
Prothrombin Time: 13.6 secs (ref 11.5–14.7)

## 2014-08-06 LAB — SEDIMENTATION RATE: Erythrocyte Sed Rate: 13 mm/hr (ref 0–20)

## 2014-08-06 LAB — MRSA PCR SCREENING

## 2014-08-06 LAB — APTT: ACTIVATED PTT: 26.5 s (ref 23.6–35.9)

## 2014-08-06 MED ORDER — OMALIZUMAB 150 MG ~~LOC~~ SOLR
375.0000 mg | Freq: Once | SUBCUTANEOUS | Status: AC
  Administered 2014-08-06: 375 mg via SUBCUTANEOUS

## 2014-08-07 LAB — URINE CULTURE

## 2014-08-11 ENCOUNTER — Encounter: Payer: Self-pay | Admitting: Internal Medicine

## 2014-08-11 ENCOUNTER — Ambulatory Visit (INDEPENDENT_AMBULATORY_CARE_PROVIDER_SITE_OTHER): Payer: Medicare PPO | Admitting: Internal Medicine

## 2014-08-11 VITALS — BP 120/60 | HR 78 | Temp 98.4°F | Wt 226.0 lb

## 2014-08-11 DIAGNOSIS — K6289 Other specified diseases of anus and rectum: Secondary | ICD-10-CM | POA: Insufficient documentation

## 2014-08-11 NOTE — Patient Instructions (Signed)
Please add senna-s 2 tablets daily. Continue the miralax. If you are still having trouble emptying your bowels, increase the senna-s to 2 tablets twice a day.

## 2014-08-11 NOTE — Assessment & Plan Note (Addendum)
No hemorrhoid Not really constipated but he gets all upset about this (like the urine issues) Will add senna-S to his regimen to ease him passing his stools The foley is probably influencing the problem  Scant heme positive stool Could be related to past radiation but can't exclude cancer He has also been putting in preparation H and lanolin. May be false positive from the manipulation. He is due for his colonoscopy in next 1-2 years No evidence of rectal mass or cancer Reassured--he should proceed with the TKR. We can reconsider if earlier colonoscopy would be appropriate

## 2014-08-11 NOTE — Progress Notes (Signed)
Pre visit review using our clinic review tool, if applicable. No additional management support is needed unless otherwise documented below in the visit note. 

## 2014-08-11 NOTE — Progress Notes (Signed)
Subjective:    Patient ID: [name], male    DOB: 06-21-1934, 80 y.o.   MRN: [id number]  HPI Has been having some bowel problems for some time May be interrelated with the catheter problems Has to strain more to move his bowels  For 2 months or so, he gets urgency but then can't go at times He will then strain Gets sense of incomplete emptying  Looked up symptoms for rectal cancer--- now convinced he may have this Thin stools Has tried to hold off and cut down on straining  Feels like he may have hemorrhoids Some pain if sitting on a hard chair No blood in stool  Tries to move bowels 3-4 times--and is successful 1-2 times per day (but then still may feel incompletely empty). Sometimes he has good normal looking stools  Uses just the miralax once a day  Has constant left 5th toe pain Has an orthotic for his shoe--may have caused pushing of that toe against the top of his shoe Took the orthotic out and is some better  Current Outpatient Prescriptions on File Prior to Visit  Medication Sig Dispense Refill  . albuterol (VENTOLIN HFA) 108 (90 BASE) MCG/ACT inhaler Inhale 2 puffs into the lungs every 4 (four) hours as needed for wheezing.  18 g  3  . ALPRAZolam (XANAX) 0.5 MG tablet Take 1 tablet (0.5 mg total) by mouth 3 (three) times daily as needed for anxiety.  90 tablet  0  . aspirin 81 MG tablet Take 81 mg by mouth daily.        . Cholecalciferol (VITAMIN D3) 5000 UNITS CAPS Take 1 capsule by mouth daily.      . Coenzyme Q10 (CO Q 10) 100 MG CAPS Take 1 capsule by mouth daily.      Marland Kitchen FOLIC ACID PO Take 1,696 mg by mouth daily.      . furosemide (LASIX) 20 MG tablet TAKE 1 TABLET BY MOUTH EVERY DAY  30 tablet  11  . gabapentin (NEURONTIN) 300 MG capsule TAKE 1 CAPSULE BY MOUTH THREE TIMES DAILY  90 capsule  3  . hydroxychloroquine (PLAQUENIL) 200 MG tablet Take 200 mg by mouth 2 (two) times daily.       . methotrexate 2.5 MG tablet Take 25 mg by mouth once a week.         . metoprolol succinate (TOPROL-XL) 50 MG 24 hr tablet TAKE 1 TABLET BY MOUTH EVERY DAY  90 tablet  0  . Multiple Vitamin (MULTIVITAMIN) tablet Take 1 tablet by mouth daily.        Marland Kitchen omalizumab (XOLAIR) 150 MG injection Inject 375 mg into the skin every 14 (fourteen) days.       Marland Kitchen omeprazole (PRILOSEC) 20 MG capsule TAKE 1 CAPSULE BY MOUTH EVERY DAY  90 capsule  0  . polyethylene glycol (GLYCOLAX) packet Take 34 g by mouth daily.       . pravastatin (PRAVACHOL) 20 MG tablet TAKE 1 TABLET BY MOUTH EVERY DAY  90 tablet  3  . PREVIDENT 5000 DRY MOUTH 1.1 % PSTE Use as directed      . solifenacin (VESICARE) 5 MG tablet Take 5 mg by mouth daily. For bladder spasms       . traMADol (ULTRAM) 50 MG tablet TAKE 1 TABLET BY MOUTH FOUR TIMES DAILY  120 tablet  0   No current facility-administered medications on file prior to visit.    Allergies  Allergen Reactions  .  Ciprofloxacin Nausea And Vomiting    Headache  . Citalopram Hydrobromide   . Lorazepam     Adverse reaction  . Paroxetine Nausea Only  . Ramipril   . Simvastatin     Past Medical History  Diagnosis Date  . CAD (coronary artery disease)   . Neurogenic bladder   . Asthma   . Chronic sinusitis   . Diverticulitis   . GERD (gastroesophageal reflux disease)   . Arthritis     rheumatoid  . Cancer     Prostate  . IBS (irritable bowel syndrome)   . Anxiety   . Neurodermatitis   . ED (erectile dysfunction)   . OSA (obstructive sleep apnea)   . Allergy   . Hypertension   . Depression   . Hx of adenomatous colonic polyps   . History of SIADH   . Hyponatremia     Past Surgical History  Procedure Laterality Date  . Prostate surgery      PROSTATECTOMY  . Knee surgery      ARTHROSCOPY LEFT  . Nasal sinus surgery  2009    DEVIATED SEPTUM AND POLYPS  . Urethral stricture dilatation  02-2010    Dr.Cope    Family History  Problem Relation Age of Onset  . Heart disease Mother 2    heart failure  . Pneumonia Father 93     pnemonia    History   Social History  . Marital Status: Married    Spouse Name: N/A    Number of Children: 2  . Years of Education: N/A   Occupational History  . Radio station owner     retired   Social History Main Topics  . Smoking status: Never Smoker   . Smokeless tobacco: Never Used  . Alcohol Use: Yes  . Drug Use: No  . Sexual Activity:    Other Topics Concern  . Not on file   Social History Narrative   Not sure about a living will or health care POA   Wife should make health care decisions for him   Would accept resuscitation attempts   Not sure about tube feeds   Review of Systems Appetite is fine Weight is stable    Objective:   Physical Exam  Constitutional: He appears well-developed and well-nourished. No distress.  Genitourinary:  No hemorrhoid Patulous rectum with catheter palpable through anterior rectal wall Stool in small balls---brown Faint heme positive  Musculoskeletal:  Left 5th toe non tender Mild decreased sensation at tip of toe Medial deviation of his toe there          Assessment & Plan:

## 2014-08-14 ENCOUNTER — Ambulatory Visit (INDEPENDENT_AMBULATORY_CARE_PROVIDER_SITE_OTHER): Payer: Medicare PPO

## 2014-08-14 DIAGNOSIS — J4530 Mild persistent asthma, uncomplicated: Secondary | ICD-10-CM

## 2014-08-14 DIAGNOSIS — J45909 Unspecified asthma, uncomplicated: Secondary | ICD-10-CM

## 2014-08-20 ENCOUNTER — Inpatient Hospital Stay: Payer: Self-pay | Admitting: General Practice

## 2014-08-21 LAB — BASIC METABOLIC PANEL
ANION GAP: 5 — AB (ref 7–16)
BUN: 10 mg/dL (ref 7–18)
CO2: 29 mmol/L (ref 21–32)
Calcium, Total: 7.3 mg/dL — ABNORMAL LOW (ref 8.5–10.1)
Chloride: 101 mmol/L (ref 98–107)
Creatinine: 0.68 mg/dL (ref 0.60–1.30)
EGFR (African American): >60
Glucose: 108 mg/dL — ABNORMAL HIGH (ref 65–99)
Osmolality: 270 (ref 275–301)
Potassium: 4 mmol/L (ref 3.5–5.1)
Sodium: 135 mmol/L — ABNORMAL LOW (ref 136–145)

## 2014-08-21 LAB — HEMOGLOBIN: HGB: 9.2 g/dL — ABNORMAL LOW (ref 13.0–18.0)

## 2014-08-21 LAB — PLATELET COUNT: PLATELETS: 99 10*3/uL — AB (ref 150–440)

## 2014-08-22 LAB — BASIC METABOLIC PANEL
Anion Gap: 1 — ABNORMAL LOW (ref 7–16)
BUN: 7 mg/dL (ref 7–18)
CREATININE: 0.66 mg/dL (ref 0.60–1.30)
Calcium, Total: 7.2 mg/dL — ABNORMAL LOW (ref 8.5–10.1)
Chloride: 102 mmol/L (ref 98–107)
Co2: 34 mmol/L — ABNORMAL HIGH (ref 21–32)
EGFR (African American): >60
Glucose: 113 mg/dL — ABNORMAL HIGH (ref 65–99)
OSMOLALITY: 273 (ref 275–301)
Potassium: 4 mmol/L (ref 3.5–5.1)
Sodium: 137 mmol/L (ref 136–145)

## 2014-08-22 LAB — PLATELET COUNT: Platelet: 98 10*3/uL — ABNORMAL LOW (ref 150–440)

## 2014-08-22 LAB — HEMOGLOBIN: HGB: 8.5 g/dL — AB (ref 13.0–18.0)

## 2014-08-22 MED ORDER — OMALIZUMAB 150 MG ~~LOC~~ SOLR
375.0000 mg | Freq: Once | SUBCUTANEOUS | Status: AC
  Administered 2014-08-22: 375 mg via SUBCUTANEOUS

## 2014-08-23 LAB — BASIC METABOLIC PANEL
ANION GAP: 5 — AB (ref 7–16)
BUN: 9 mg/dL (ref 7–18)
CALCIUM: 7.7 mg/dL — AB (ref 8.5–10.1)
Chloride: 95 mmol/L — ABNORMAL LOW (ref 98–107)
Co2: 32 mmol/L (ref 21–32)
Creatinine: 0.59 mg/dL — ABNORMAL LOW (ref 0.60–1.30)
EGFR (African American): >60
EGFR (Non-African Amer.): >60
Glucose: 116 mg/dL — ABNORMAL HIGH (ref 65–99)
Osmolality: 264 (ref 275–301)
Potassium: 4.2 mmol/L (ref 3.5–5.1)
Sodium: 132 mmol/L — ABNORMAL LOW (ref 136–145)

## 2014-08-23 LAB — HEMOGLOBIN: HGB: 9.1 g/dL — AB (ref 13.0–18.0)

## 2014-08-24 ENCOUNTER — Encounter: Payer: Self-pay | Admitting: Internal Medicine

## 2014-08-28 ENCOUNTER — Ambulatory Visit: Payer: Self-pay | Admitting: Gerontology

## 2014-08-28 ENCOUNTER — Encounter: Payer: Self-pay | Admitting: Internal Medicine

## 2014-08-29 ENCOUNTER — Telehealth: Payer: Self-pay | Admitting: Pulmonary Disease

## 2014-08-29 ENCOUNTER — Ambulatory Visit: Payer: Medicare PPO

## 2014-08-29 NOTE — Telephone Encounter (Signed)
VS pt is scheduled to get his xolair on 10/6 and he stated that he has been in the hospital recently and he wanted to make sure that he should keep this appt for the injection, or should he reschedule for a later date?  VS please advise. Thanks  Allergies  Allergen Reactions  . Ciprofloxacin Nausea And Vomiting    Headache  . Citalopram Hydrobromide   . Lorazepam     Adverse reaction  . Paroxetine Nausea Only  . Ramipril   . Simvastatin     Current Outpatient Prescriptions on File Prior to Visit  Medication Sig Dispense Refill  . albuterol (VENTOLIN HFA) 108 (90 BASE) MCG/ACT inhaler Inhale 2 puffs into the lungs every 4 (four) hours as needed for wheezing.  18 g  3  . ALPRAZolam (XANAX) 0.5 MG tablet Take 1 tablet (0.5 mg total) by mouth 3 (three) times daily as needed for anxiety.  90 tablet  0  . aspirin 81 MG tablet Take 81 mg by mouth daily.        . Cholecalciferol (VITAMIN D3) 5000 UNITS CAPS Take 1 capsule by mouth daily.      . Coenzyme Q10 (CO Q 10) 100 MG CAPS Take 1 capsule by mouth daily.      Marland Kitchen FOLIC ACID PO Take 9,509 mg by mouth daily.      . furosemide (LASIX) 20 MG tablet TAKE 1 TABLET BY MOUTH EVERY DAY  30 tablet  11  . gabapentin (NEURONTIN) 300 MG capsule TAKE 1 CAPSULE BY MOUTH THREE TIMES DAILY  90 capsule  3  . hydroxychloroquine (PLAQUENIL) 200 MG tablet Take 200 mg by mouth 2 (two) times daily.       . methotrexate 2.5 MG tablet Take 25 mg by mouth once a week.       . metoprolol succinate (TOPROL-XL) 50 MG 24 hr tablet TAKE 1 TABLET BY MOUTH EVERY DAY  90 tablet  0  . Multiple Vitamin (MULTIVITAMIN) tablet Take 1 tablet by mouth daily.        Marland Kitchen omalizumab (XOLAIR) 150 MG injection Inject 375 mg into the skin every 14 (fourteen) days.       Marland Kitchen omeprazole (PRILOSEC) 20 MG capsule TAKE 1 CAPSULE BY MOUTH EVERY DAY  90 capsule  0  . polyethylene glycol (GLYCOLAX) packet Take 34 g by mouth daily.       . pravastatin (PRAVACHOL) 20 MG tablet TAKE 1 TABLET BY MOUTH  EVERY DAY  90 tablet  3  . PREVIDENT 5000 DRY MOUTH 1.1 % PSTE Use as directed      . sennosides-docusate sodium (SENOKOT-S) 8.6-50 MG tablet Take 2 tablets by mouth daily.      . solifenacin (VESICARE) 5 MG tablet Take 5 mg by mouth daily. For bladder spasms       . traMADol (ULTRAM) 50 MG tablet TAKE 1 TABLET BY MOUTH FOUR TIMES DAILY  120 tablet  0   No current facility-administered medications on file prior to visit.

## 2014-08-30 NOTE — Telephone Encounter (Signed)
It depends on why he was in hospital.  Please get more details about his hospital stay.

## 2014-09-01 NOTE — Telephone Encounter (Signed)
Spoke with pt and advised that Dr Annamaria Boots said it is ok to get xolair injection.  Pt verbalized understanding.

## 2014-09-01 NOTE — Telephone Encounter (Signed)
Called and spoke with pt's wife. Pt had left TKR last week and is currently at rehab center for the next several days. Pt was going to have the rehab center transport pt here for injection. Pt had surgery at [facility]. Per pt's wife, pt had no complications with is breathing and is doing well.   Unable to reach pt.   WIll forward to VS.

## 2014-09-01 NOTE — Telephone Encounter (Signed)
He can have his xolair shot.

## 2014-09-02 ENCOUNTER — Ambulatory Visit (INDEPENDENT_AMBULATORY_CARE_PROVIDER_SITE_OTHER): Payer: Medicare PPO

## 2014-09-02 DIAGNOSIS — J4530 Mild persistent asthma, uncomplicated: Secondary | ICD-10-CM

## 2014-09-03 ENCOUNTER — Telehealth: Payer: Self-pay

## 2014-09-03 NOTE — Telephone Encounter (Signed)
Mrs Robinson said pt recently had knee replacement and pt is doing well; pt would like to talk with Dr Silvio Pate about knee replacement when Dr Silvio Pate has time to call pt at [phone]. Pt is presently at [facility] rehab and Dr [name] did surgery. Mrs Shawler said this is more of "a friend to friend call".

## 2014-09-03 NOTE — Telephone Encounter (Signed)
Called. No answer. Will try again tomorrow.

## 2014-09-04 NOTE — Telephone Encounter (Signed)
No answer again. Spoke to wife on her cell phone He is doing well overall and I will try to reach him tomorrow

## 2014-09-09 LAB — CBC WITH DIFFERENTIAL/PLATELET
BASOS PCT: 1.1 %
Basophil #: 0.1 10*3/uL (ref 0.0–0.1)
EOS PCT: 7.8 %
Eosinophil #: 0.5 10*3/uL (ref 0.0–0.7)
HCT: 29.5 % — AB (ref 40.0–52.0)
HGB: 9.4 g/dL — ABNORMAL LOW (ref 13.0–18.0)
Lymphocyte #: 1.3 10*3/uL (ref 1.0–3.6)
Lymphocyte %: 18.9 %
MCH: 30.8 pg (ref 26.0–34.0)
MCHC: 32 g/dL (ref 32.0–36.0)
MCV: 96 fL (ref 80–100)
MONO ABS: 0.4 x10 3/mm (ref 0.2–1.0)
Monocyte %: 6.4 %
NEUTROS PCT: 65.8 %
Neutrophil #: 4.4 10*3/uL (ref 1.4–6.5)
PLATELETS: 230 10*3/uL (ref 150–440)
RBC: 3.07 10*6/uL — ABNORMAL LOW (ref 4.40–5.90)
RDW: 15.8 % — AB (ref 11.5–14.5)
WBC: 6.7 10*3/uL (ref 3.8–10.6)

## 2014-09-09 LAB — SEDIMENTATION RATE: Erythrocyte Sed Rate: 42 mm/hr — ABNORMAL HIGH (ref 0–20)

## 2014-09-09 LAB — BASIC METABOLIC PANEL
Anion Gap: 6 — ABNORMAL LOW (ref 7–16)
BUN: 15 mg/dL (ref 7–18)
CHLORIDE: 103 mmol/L (ref 98–107)
Calcium, Total: 8.3 mg/dL — ABNORMAL LOW (ref 8.5–10.1)
Co2: 30 mmol/L (ref 21–32)
Creatinine: 0.82 mg/dL (ref 0.60–1.30)
EGFR (Non-African Amer.): >60
GLUCOSE: 84 mg/dL (ref 65–99)
OSMOLALITY: 278 (ref 275–301)
POTASSIUM: 4 mmol/L (ref 3.5–5.1)
SODIUM: 139 mmol/L (ref 136–145)

## 2014-09-09 LAB — ALT: ALT: 32 U/L

## 2014-09-09 LAB — SGOT (AST)(ARMC): SGOT(AST): 17 U/L (ref 15–37)

## 2014-09-09 LAB — ALBUMIN: ALBUMIN: 3 g/dL — AB (ref 3.4–5.0)

## 2014-09-12 MED ORDER — OMALIZUMAB 150 MG ~~LOC~~ SOLR
375.0000 mg | Freq: Once | SUBCUTANEOUS | Status: AC
  Administered 2014-09-12: 375 mg via SUBCUTANEOUS

## 2014-09-15 ENCOUNTER — Encounter: Payer: Self-pay | Admitting: Internal Medicine

## 2014-09-15 ENCOUNTER — Ambulatory Visit (INDEPENDENT_AMBULATORY_CARE_PROVIDER_SITE_OTHER): Payer: Medicare PPO | Admitting: Internal Medicine

## 2014-09-15 VITALS — BP 110/70 | HR 80 | Temp 98.3°F | Resp 18 | Wt 225.0 lb

## 2014-09-15 DIAGNOSIS — J209 Acute bronchitis, unspecified: Secondary | ICD-10-CM | POA: Insufficient documentation

## 2014-09-15 DIAGNOSIS — J4521 Mild intermittent asthma with (acute) exacerbation: Secondary | ICD-10-CM

## 2014-09-15 MED ORDER — DOXYCYCLINE HYCLATE 100 MG PO TABS: 100.0000 mg | ORAL_TABLET | Freq: Two times a day (BID) | ORAL | Status: DC

## 2014-09-15 MED ORDER — PREDNISONE 20 MG PO TABS: 40.0000 mg | ORAL_TABLET | Freq: Every day | ORAL | Status: DC

## 2014-09-15 NOTE — Assessment & Plan Note (Signed)
Mild exacerbation?? First since on xolair Very mild anyway Will give 5 days of prednisone

## 2014-09-15 NOTE — Progress Notes (Signed)
Subjective:    Patient ID: [name], male    DOB: 1934/11/13, 80 y.o.   MRN: [id number]  HPI Here with wife  Finally had the left TKR Then to skilled nursing at [facility] [place] for 20 days or so Did very well  Started with cough the day before discharge--- about 4-5 days ago Will have occasional gob of tough phlegm Feels pain in chest with cough Doesn't feel sick No SOB  Some nasal congestion-- using his flonase regularly with some relief No fever No sweats at night  Current Outpatient Prescriptions on File Prior to Visit  Medication Sig Dispense Refill  . albuterol (VENTOLIN HFA) 108 (90 BASE) MCG/ACT inhaler Inhale 2 puffs into the lungs every 4 (four) hours as needed for wheezing.  18 g  3  . ALPRAZolam (XANAX) 0.5 MG tablet Take 1 tablet (0.5 mg total) by mouth 3 (three) times daily as needed for anxiety.  90 tablet  0  . aspirin 81 MG tablet Take 81 mg by mouth daily.        . Cholecalciferol (VITAMIN D3) 5000 UNITS CAPS Take 1 capsule by mouth daily.      . Coenzyme Q10 (CO Q 10) 100 MG CAPS Take 1 capsule by mouth daily.      Marland Kitchen FOLIC ACID PO Take 2,707 mg by mouth daily.      . furosemide (LASIX) 20 MG tablet TAKE 1 TABLET BY MOUTH EVERY DAY  30 tablet  11  . gabapentin (NEURONTIN) 300 MG capsule TAKE 1 CAPSULE BY MOUTH THREE TIMES DAILY  90 capsule  3  . hydroxychloroquine (PLAQUENIL) 200 MG tablet Take 200 mg by mouth 2 (two) times daily.       . methotrexate 2.5 MG tablet Take 25 mg by mouth once a week.       . metoprolol succinate (TOPROL-XL) 50 MG 24 hr tablet TAKE 1 TABLET BY MOUTH EVERY DAY  90 tablet  0  . Multiple Vitamin (MULTIVITAMIN) tablet Take 1 tablet by mouth daily.        Marland Kitchen omalizumab (XOLAIR) 150 MG injection Inject 375 mg into the skin every 14 (fourteen) days.       Marland Kitchen omeprazole (PRILOSEC) 20 MG capsule TAKE 1 CAPSULE BY MOUTH EVERY DAY  90 capsule  0  . polyethylene glycol (GLYCOLAX) packet Take 34 g by mouth daily.       . pravastatin  (PRAVACHOL) 20 MG tablet TAKE 1 TABLET BY MOUTH EVERY DAY  90 tablet  3  . PREVIDENT 5000 DRY MOUTH 1.1 % PSTE Use as directed      . solifenacin (VESICARE) 5 MG tablet Take 5 mg by mouth daily. For bladder spasms       . traMADol (ULTRAM) 50 MG tablet TAKE 1 TABLET BY MOUTH FOUR TIMES DAILY  120 tablet  0   No current facility-administered medications on file prior to visit.    Allergies  Allergen Reactions  . Ciprofloxacin Nausea And Vomiting    Headache  . Citalopram Hydrobromide   . Lorazepam     Adverse reaction  . Paroxetine Nausea Only  . Ramipril   . Simvastatin     Past Medical History  Diagnosis Date  . CAD (coronary artery disease)   . Neurogenic bladder   . Asthma   . Chronic sinusitis   . Diverticulitis   . GERD (gastroesophageal reflux disease)   . Arthritis     rheumatoid  . Cancer  Prostate  . IBS (irritable bowel syndrome)   . Anxiety   . Neurodermatitis   . ED (erectile dysfunction)   . OSA (obstructive sleep apnea)   . Allergy   . Hypertension   . Depression   . Hx of adenomatous colonic polyps   . History of SIADH   . Hyponatremia     Past Surgical History  Procedure Laterality Date  . Prostate surgery      PROSTATECTOMY  . Knee surgery      ARTHROSCOPY LEFT  . Nasal sinus surgery  2009    DEVIATED SEPTUM AND POLYPS  . Urethral stricture dilatation  02-2010    Dr.Cope  . Total knee arthroplasty Left 9/15    Dr [name]    Family History  Problem Relation Age of Onset  . Heart disease Mother 28    heart failure  . Pneumonia Father 7    pnemonia    History   Social History  . Marital Status: Married    Spouse Name: N/A    Number of Children: 2  . Years of Education: N/A   Occupational History  . Radio station owner     retired   Social History Main Topics  . Smoking status: Never Smoker   . Smokeless tobacco: Never Used  . Alcohol Use: Yes  . Drug Use: No  . Sexual Activity:    Other Topics Concern  . Not on  file   Social History Narrative   Not sure about a living will or health care POA   Wife should make health care decisions for him   Would accept resuscitation attempts   Not sure about tube feeds   Review of Systems Has xolair shot on Thursday--wonders whether this will be affected Did have duplex scan of leg for some swelling last week--it was negative for DVT    Objective:   Physical Exam  Constitutional: He appears well-developed and well-nourished. No distress.  HENT:  Mouth/Throat: Oropharynx is clear and moist. No oropharyngeal exudate.  No sinus tenderness TMs normal Mild nasal congestion  Neck: Normal range of motion. Neck supple.  Pulmonary/Chest: Effort normal. No respiratory distress. He has no rales.  Slight exp wheeze and slight exp phase prolongation Rhonchi on exp also  Lymphadenopathy:    He has no cervical adenopathy.          Assessment & Plan:

## 2014-09-15 NOTE — Assessment & Plan Note (Signed)
Could be viral but just out of SNF Will go with empiric doxy

## 2014-09-15 NOTE — Progress Notes (Signed)
Pre visit review using our clinic review tool, if applicable. No additional management support is needed unless otherwise documented below in the visit note. 

## 2014-09-16 ENCOUNTER — Telehealth: Payer: Self-pay

## 2014-09-16 NOTE — Telephone Encounter (Signed)
Per SN- okay to move to Friday for xolair and ok to get xolair while on pred and doxy LMTCB for the pt

## 2014-09-16 NOTE — Telephone Encounter (Signed)
Pt called allergy lab- states that he has had a nonprod cough since Thursday, some "pre-wheezing".  Saw Dr. Silvio Pate for this yesterday, was given a 5 day pred taper and doxycycline to treat this.  Pt had recently been hospitalized and in rehab for knee replacement, was released on Saturday from rehab.  Pt is scheduled to get his xolair injection on Thursday, which is on schedule with his normal xolair injections (pt receives 375mg  every 2 weeks).  Pt wants to move xolair appt to Friday, but is wondering if he is ok to still come in and get xolair injections since he is on doxy and pred taper, and has been in the hospital recently.    Will send to [facility] since VS is out today.  Dr. Lenna Gilford please advise.  Thank you!  Allergies  Allergen Reactions  . Ciprofloxacin Nausea And Vomiting    Headache  . Citalopram Hydrobromide   . Lorazepam     Adverse reaction  . Paroxetine Nausea Only  . Ramipril   . Simvastatin    Current Outpatient Prescriptions on File Prior to Visit  Medication Sig Dispense Refill  . albuterol (VENTOLIN HFA) 108 (90 BASE) MCG/ACT inhaler Inhale 2 puffs into the lungs every 4 (four) hours as needed for wheezing.  18 g  3  . ALPRAZolam (XANAX) 0.5 MG tablet Take 1 tablet (0.5 mg total) by mouth 3 (three) times daily as needed for anxiety.  90 tablet  0  . aspirin 81 MG tablet Take 81 mg by mouth daily.        . Cholecalciferol (VITAMIN D3) 5000 UNITS CAPS Take 1 capsule by mouth daily.      . Coenzyme Q10 (CO Q 10) 100 MG CAPS Take 1 capsule by mouth daily.      Marland Kitchen doxycycline (VIBRA-TABS) 100 MG tablet Take 1 tablet (100 mg total) by mouth 2 (two) times daily.  14 tablet  0  . FOLIC ACID PO Take 8,546 mg by mouth daily.      . furosemide (LASIX) 20 MG tablet TAKE 1 TABLET BY MOUTH EVERY DAY  30 tablet  11  . gabapentin (NEURONTIN) 300 MG capsule TAKE 1 CAPSULE BY MOUTH THREE TIMES DAILY  90 capsule  3  . hydroxychloroquine (PLAQUENIL) 200 MG tablet Take 200 mg by mouth 2 (two)  times daily.       . methotrexate 2.5 MG tablet Take 25 mg by mouth once a week.       . metoprolol succinate (TOPROL-XL) 50 MG 24 hr tablet TAKE 1 TABLET BY MOUTH EVERY DAY  90 tablet  0  . Multiple Vitamin (MULTIVITAMIN) tablet Take 1 tablet by mouth daily.        Marland Kitchen omalizumab (XOLAIR) 150 MG injection Inject 375 mg into the skin every 14 (fourteen) days.       Marland Kitchen omeprazole (PRILOSEC) 20 MG capsule TAKE 1 CAPSULE BY MOUTH EVERY DAY  90 capsule  0  . oxyCODONE-acetaminophen (PERCOCET/ROXICET) 5-325 MG per tablet       . polyethylene glycol (GLYCOLAX) packet Take 34 g by mouth daily.       . pravastatin (PRAVACHOL) 20 MG tablet TAKE 1 TABLET BY MOUTH EVERY DAY  90 tablet  3  . predniSONE (DELTASONE) 20 MG tablet Take 2 tablets (40 mg total) by mouth daily.  10 tablet  0  . PREVIDENT 5000 DRY MOUTH 1.1 % PSTE Use as directed      . solifenacin (VESICARE) 5 MG  tablet Take 5 mg by mouth daily. For bladder spasms       . traMADol (ULTRAM) 50 MG tablet TAKE 1 TABLET BY MOUTH FOUR TIMES DAILY  120 tablet  0   No current facility-administered medications on file prior to visit.

## 2014-09-17 ENCOUNTER — Ambulatory Visit: Payer: Medicare PPO

## 2014-09-17 NOTE — Telephone Encounter (Signed)
Spoke with pt, he is aware that he can still take xolair injection this week.  Offerred to reschedule appt from thurs to Friday as previously explained, pt states he is unsure if his PT appt is on Thursday or Friday, will call when he gets home if he needs to move it from Thursday.  Nothing further needed at this time.

## 2014-09-18 ENCOUNTER — Ambulatory Visit: Payer: Medicare PPO

## 2014-09-19 ENCOUNTER — Ambulatory Visit: Payer: Medicare PPO

## 2014-09-19 ENCOUNTER — Other Ambulatory Visit: Payer: Self-pay

## 2014-09-19 ENCOUNTER — Ambulatory Visit (INDEPENDENT_AMBULATORY_CARE_PROVIDER_SITE_OTHER): Payer: Medicare PPO

## 2014-09-19 DIAGNOSIS — J4520 Mild intermittent asthma, uncomplicated: Secondary | ICD-10-CM

## 2014-09-19 MED ORDER — PREDNISONE 20 MG PO TABS: 40.0000 mg | ORAL_TABLET | Freq: Every day | ORAL | Status: DC

## 2014-09-19 NOTE — Telephone Encounter (Signed)
Mr. Walck notified prescription has been sent to his pharmacy.

## 2014-09-19 NOTE — Telephone Encounter (Signed)
Pt left vm; pt has finished one week of prednisone [address]; pt is still coughing especially at night. Pt wants to know if can get refill of prednisone.Please advise.

## 2014-09-19 NOTE — Telephone Encounter (Signed)
Will refill prednisone, but let pt know that if severe SOB got to ER.  If not improving by mOnday make appt to be soon.

## 2014-09-21 ENCOUNTER — Other Ambulatory Visit: Payer: Self-pay | Admitting: Internal Medicine

## 2014-09-25 ENCOUNTER — Other Ambulatory Visit: Payer: Self-pay | Admitting: Internal Medicine

## 2014-09-26 ENCOUNTER — Other Ambulatory Visit: Payer: Self-pay

## 2014-09-26 MED ORDER — OMALIZUMAB 150 MG ~~LOC~~ SOLR
375.0000 mg | Freq: Once | SUBCUTANEOUS | Status: AC
  Administered 2014-09-26: 375 mg via SUBCUTANEOUS

## 2014-09-26 NOTE — Telephone Encounter (Signed)
Rx called in as prescribed 

## 2014-09-26 NOTE — Telephone Encounter (Signed)
Pt left v/m; pt recently had knee replacement and he request rx for oxycodone apap.pt was told to get refill from Dr Silvio Pate.Please advise.

## 2014-09-26 NOTE — Telephone Encounter (Signed)
Will refill times one in PCP absence  Px written for call in

## 2014-09-26 NOTE — Telephone Encounter (Signed)
Last filled 07/28/14 LETVAK PATIENT, Please send back to me for call in

## 2014-09-28 ENCOUNTER — Encounter: Payer: Self-pay | Admitting: Internal Medicine

## 2014-09-28 ENCOUNTER — Emergency Department: Payer: Self-pay | Admitting: Emergency Medicine

## 2014-09-28 LAB — CBC
HCT: 33.6 % — AB (ref 40.0–52.0)
HGB: 11.1 g/dL — ABNORMAL LOW (ref 13.0–18.0)
MCH: 31.4 pg (ref 26.0–34.0)
MCHC: 32.9 g/dL (ref 32.0–36.0)
MCV: 96 fL (ref 80–100)
Platelet: 192 10*3/uL (ref 150–440)
RBC: 3.52 10*6/uL — ABNORMAL LOW (ref 4.40–5.90)
RDW: 16.9 % — AB (ref 11.5–14.5)
WBC: 6.8 10*3/uL (ref 3.8–10.6)

## 2014-09-28 LAB — COMPREHENSIVE METABOLIC PANEL
ANION GAP: 7 (ref 7–16)
Albumin: 3.2 g/dL — ABNORMAL LOW (ref 3.4–5.0)
Alkaline Phosphatase: 49 U/L
BILIRUBIN TOTAL: 0.5 mg/dL (ref 0.2–1.0)
BUN: 16 mg/dL (ref 7–18)
CALCIUM: 8.2 mg/dL — AB (ref 8.5–10.1)
CO2: 30 mmol/L (ref 21–32)
Chloride: 101 mmol/L (ref 98–107)
Creatinine: 0.62 mg/dL (ref 0.60–1.30)
EGFR (African American): >60
EGFR (Non-African Amer.): >60
Glucose: 137 mg/dL — ABNORMAL HIGH (ref 65–99)
OSMOLALITY: 279 (ref 275–301)
Potassium: 4.6 mmol/L (ref 3.5–5.1)
SGOT(AST): 36 U/L (ref 15–37)
SGPT (ALT): 26 U/L
SODIUM: 138 mmol/L (ref 136–145)
Total Protein: 5.9 g/dL — ABNORMAL LOW (ref 6.4–8.2)

## 2014-09-28 LAB — URINALYSIS, COMPLETE
Bacteria: NONE SEEN
RBC,UR: 24319 /HPF (ref 0–5)
Specific Gravity: 1.025 (ref 1.003–1.030)
Squamous Epithelial: NONE SEEN
WBC UR: 2749 /HPF (ref 0–5)

## 2014-09-29 NOTE — Telephone Encounter (Signed)
Pt left v/m requesting status of pain med rx; advised pt provider wants pt to ck with ortho. Pt voiced understanding and will contact ortho.

## 2014-10-01 LAB — URINE CULTURE

## 2014-10-06 ENCOUNTER — Ambulatory Visit (INDEPENDENT_AMBULATORY_CARE_PROVIDER_SITE_OTHER): Payer: Medicare PPO

## 2014-10-06 DIAGNOSIS — J4520 Mild intermittent asthma, uncomplicated: Secondary | ICD-10-CM

## 2014-10-14 MED ORDER — OMALIZUMAB 150 MG ~~LOC~~ SOLR
375.0000 mg | Freq: Once | SUBCUTANEOUS | Status: AC
  Administered 2014-10-06: 375 mg via SUBCUTANEOUS

## 2014-10-15 ENCOUNTER — Other Ambulatory Visit: Payer: Self-pay | Admitting: Internal Medicine

## 2014-10-16 NOTE — Telephone Encounter (Signed)
Approved: #[id number] x 0 

## 2014-10-16 NOTE — Telephone Encounter (Signed)
rx called into pharmacy

## 2014-10-16 NOTE — Telephone Encounter (Signed)
07/04/14 

## 2014-10-21 ENCOUNTER — Telehealth: Payer: Self-pay | Admitting: Pulmonary Disease

## 2014-10-21 ENCOUNTER — Ambulatory Visit (INDEPENDENT_AMBULATORY_CARE_PROVIDER_SITE_OTHER): Payer: Medicare PPO

## 2014-10-21 DIAGNOSIS — J4520 Mild intermittent asthma, uncomplicated: Secondary | ICD-10-CM

## 2014-10-21 NOTE — Telephone Encounter (Signed)
Chantel and I will work with this patient to take care of his Xolair needs. Thanks.

## 2014-10-22 MED ORDER — OMALIZUMAB 150 MG ~~LOC~~ SOLR
375.0000 mg | Freq: Once | SUBCUTANEOUS | Status: AC
  Administered 2014-10-21: 375 mg via SUBCUTANEOUS

## 2014-10-23 ENCOUNTER — Other Ambulatory Visit: Payer: Self-pay | Admitting: Internal Medicine

## 2014-10-26 ENCOUNTER — Other Ambulatory Visit: Payer: Self-pay | Admitting: Family Medicine

## 2014-10-27 NOTE — Telephone Encounter (Signed)
09/26/14 

## 2014-10-27 NOTE — Telephone Encounter (Signed)
Approved: #[id number] x 0 

## 2014-10-27 NOTE — Telephone Encounter (Signed)
Please advise on status. Thanks.

## 2014-10-27 NOTE — Telephone Encounter (Signed)
We will need patient to let us know if he has received a PA notification letter from his insurance company. We have not received anything about patient in our office. If patient has letter for renewal of PA we need him to bring to the office or fax to our office. Thanks.

## 2014-10-27 NOTE — Telephone Encounter (Signed)
Spoke with the patient, has not received anything from the [place] regarding renewal/approval. Will send back to [facility] to follow up on.

## 2014-10-27 NOTE — Telephone Encounter (Signed)
Since this is not a PA renewal then Chantel will need to look at the billing side for patient to make sure billing is going through smoothly for patient.

## 2014-10-27 NOTE — Telephone Encounter (Signed)
Medication phoned to pharmacy.  

## 2014-10-28 ENCOUNTER — Encounter: Payer: Self-pay | Admitting: Internal Medicine

## 2014-10-31 NOTE — Telephone Encounter (Signed)
There is nothing wrong with his billing his ins is paying for his xolair

## 2014-10-31 NOTE — Telephone Encounter (Signed)
Spoke with pts wife and she is aware that his Arvid Right is being covered by his insurance.  Nothing further is needed.

## 2014-10-31 NOTE — Telephone Encounter (Signed)
lmtcb X1 to make pt aware.  

## 2014-11-05 ENCOUNTER — Ambulatory Visit: Payer: Medicare PPO

## 2014-11-07 ENCOUNTER — Ambulatory Visit (INDEPENDENT_AMBULATORY_CARE_PROVIDER_SITE_OTHER): Payer: Medicare PPO

## 2014-11-07 DIAGNOSIS — J4520 Mild intermittent asthma, uncomplicated: Secondary | ICD-10-CM

## 2014-11-11 MED ORDER — OMALIZUMAB 150 MG ~~LOC~~ SOLR
375.0000 mg | Freq: Once | SUBCUTANEOUS | Status: AC
  Administered 2014-11-07: 375 mg via SUBCUTANEOUS

## 2014-11-24 ENCOUNTER — Other Ambulatory Visit: Payer: Self-pay | Admitting: Internal Medicine

## 2014-11-24 ENCOUNTER — Ambulatory Visit: Payer: Medicare PPO

## 2014-11-25 NOTE — Telephone Encounter (Signed)
Approved: #[id number] x 0 

## 2014-11-25 NOTE — Telephone Encounter (Signed)
Last filled 10/27/2014

## 2014-11-25 NOTE — Telephone Encounter (Signed)
rx called into pharmacy

## 2014-11-26 ENCOUNTER — Ambulatory Visit (INDEPENDENT_AMBULATORY_CARE_PROVIDER_SITE_OTHER): Payer: Medicare PPO

## 2014-11-26 DIAGNOSIS — J4520 Mild intermittent asthma, uncomplicated: Secondary | ICD-10-CM

## 2014-11-26 MED ORDER — OMALIZUMAB 150 MG ~~LOC~~ SOLR
375.0000 mg | Freq: Once | SUBCUTANEOUS | Status: AC
  Administered 2014-11-26: 375 mg via SUBCUTANEOUS

## 2014-11-26 MED ORDER — OMALIZUMAB 150 MG ~~LOC~~ SOLR
300.0000 mg | Freq: Once | SUBCUTANEOUS | Status: AC
  Administered 2014-11-26: 300 mg via SUBCUTANEOUS

## 2014-11-28 ENCOUNTER — Encounter: Payer: Self-pay | Admitting: Internal Medicine

## 2014-12-09 ENCOUNTER — Other Ambulatory Visit: Payer: Self-pay | Admitting: Internal Medicine

## 2014-12-11 ENCOUNTER — Ambulatory Visit: Payer: Medicare PPO

## 2014-12-12 ENCOUNTER — Ambulatory Visit (INDEPENDENT_AMBULATORY_CARE_PROVIDER_SITE_OTHER): Payer: Medicare PPO

## 2014-12-12 DIAGNOSIS — J4520 Mild intermittent asthma, uncomplicated: Secondary | ICD-10-CM

## 2014-12-12 MED ORDER — OMALIZUMAB 150 MG ~~LOC~~ SOLR
375.0000 mg | Freq: Once | SUBCUTANEOUS | Status: AC
  Administered 2014-12-12: 375 mg via SUBCUTANEOUS

## 2014-12-22 ENCOUNTER — Other Ambulatory Visit: Payer: Self-pay | Admitting: Internal Medicine

## 2014-12-22 NOTE — Telephone Encounter (Signed)
Approved: #[id number] x 0 

## 2014-12-22 NOTE — Telephone Encounter (Signed)
rx called into pharmacy

## 2014-12-22 NOTE — Telephone Encounter (Signed)
10/16/14 

## 2014-12-26 ENCOUNTER — Other Ambulatory Visit: Payer: Self-pay | Admitting: *Deleted

## 2014-12-26 ENCOUNTER — Other Ambulatory Visit: Payer: Self-pay | Admitting: Internal Medicine

## 2014-12-26 NOTE — Telephone Encounter (Signed)
Pt requesting medication refill. Has upcoming f/u appt 12/2014. pls advise

## 2014-12-26 NOTE — Telephone Encounter (Signed)
Rx called in to requested pharmacy 

## 2014-12-26 NOTE — Telephone Encounter (Signed)
Approved: #[id number] x 0 

## 2014-12-29 ENCOUNTER — Ambulatory Visit (INDEPENDENT_AMBULATORY_CARE_PROVIDER_SITE_OTHER): Payer: Medicare PPO

## 2014-12-29 DIAGNOSIS — J4520 Mild intermittent asthma, uncomplicated: Secondary | ICD-10-CM

## 2014-12-31 DIAGNOSIS — H40003 Preglaucoma, unspecified, bilateral: Secondary | ICD-10-CM | POA: Diagnosis not present

## 2015-01-01 MED ORDER — OMALIZUMAB 150 MG ~~LOC~~ SOLR
375.0000 mg | Freq: Once | SUBCUTANEOUS | Status: AC
  Administered 2014-12-29: 375 mg via SUBCUTANEOUS

## 2015-01-05 ENCOUNTER — Encounter: Payer: Self-pay | Admitting: Internal Medicine

## 2015-01-13 ENCOUNTER — Ambulatory Visit (INDEPENDENT_AMBULATORY_CARE_PROVIDER_SITE_OTHER): Payer: Medicare PPO

## 2015-01-13 DIAGNOSIS — J4520 Mild intermittent asthma, uncomplicated: Secondary | ICD-10-CM

## 2015-01-15 MED ORDER — OMALIZUMAB 150 MG ~~LOC~~ SOLR
375.0000 mg | Freq: Once | SUBCUTANEOUS | Status: AC
  Administered 2015-01-13: 375 mg via SUBCUTANEOUS

## 2015-01-25 ENCOUNTER — Other Ambulatory Visit: Payer: Self-pay | Admitting: Internal Medicine

## 2015-01-26 ENCOUNTER — Encounter: Payer: Self-pay | Admitting: Internal Medicine

## 2015-01-26 ENCOUNTER — Ambulatory Visit (INDEPENDENT_AMBULATORY_CARE_PROVIDER_SITE_OTHER): Payer: Medicare PPO | Admitting: Internal Medicine

## 2015-01-26 ENCOUNTER — Ambulatory Visit: Payer: Medicare PPO | Admitting: Internal Medicine

## 2015-01-26 VITALS — BP 102/68 | HR 63 | Temp 97.4°F | Wt 226.4 lb

## 2015-01-26 DIAGNOSIS — F419 Anxiety disorder, unspecified: Secondary | ICD-10-CM

## 2015-01-26 DIAGNOSIS — I2510 Atherosclerotic heart disease of native coronary artery without angina pectoris: Secondary | ICD-10-CM

## 2015-01-26 DIAGNOSIS — M069 Rheumatoid arthritis, unspecified: Secondary | ICD-10-CM

## 2015-01-26 DIAGNOSIS — K5909 Other constipation: Secondary | ICD-10-CM

## 2015-01-26 DIAGNOSIS — I10 Essential (primary) hypertension: Secondary | ICD-10-CM

## 2015-01-26 DIAGNOSIS — E785 Hyperlipidemia, unspecified: Secondary | ICD-10-CM

## 2015-01-26 DIAGNOSIS — Z8546 Personal history of malignant neoplasm of prostate: Secondary | ICD-10-CM

## 2015-01-26 NOTE — Telephone Encounter (Signed)
Approved: alprazolam #90 x 0 Omeprazole okay for a year

## 2015-01-26 NOTE — Assessment & Plan Note (Signed)
No problems with statin Due for labs 

## 2015-01-26 NOTE — Assessment & Plan Note (Signed)
Doing well with the plaquenil and MTX Due for my labs--will forward to Dr Raliegh Ip

## 2015-01-26 NOTE — Progress Notes (Signed)
Pre visit review using our clinic review tool, if applicable. No additional management support is needed unless otherwise documented below in the visit note. 

## 2015-01-26 NOTE — Telephone Encounter (Signed)
Xanax called int [facility] per Dr Silvio Pate approval.

## 2015-01-26 NOTE — Assessment & Plan Note (Signed)
BP Readings from Last 3 Encounters:  01/26/15 102/68  09/15/14 110/70  08/11/14 120/60   Good control

## 2015-01-26 NOTE — Patient Instructions (Signed)
Please add biscodyl 5mg  daily (dulcolax) or senna-s 2 tabs daily. If you continue to have problems with your bowels, I will send you back to Dr Fuller Plan

## 2015-01-26 NOTE — Telephone Encounter (Signed)
Electronic request for xanax.  Patient last seen 09/15/2014.  Last filled 12/26/2014.  Please advise.

## 2015-01-26 NOTE — Assessment & Plan Note (Signed)
Has been quiet No med changes needed

## 2015-01-26 NOTE — Progress Notes (Signed)
Subjective:    Patient ID: [name], male    DOB: 1934-11-17, 81 y.o.   MRN: [id number]  HPI Here with wife for follow up of medical conditions  Knee continues to improve Now just using cane Dr [name] is pleased No longer needs pain pills Now has problems with right knee--did get a cortisone shot in it Now working on quad strengthening  Continues on MTX and plaquenil Seems to be controlling the RA Continues to see Dr Jefm Bryant  Ongoing bowel problems His habits "have changed a lot"--- since being in rehab Intestinal cramps, urgency--then doesn't have to go Doesn't get feeling of complete emptying Does go up to 3-4 times per day This is worsening his anxiety---he is really focusing on this He feels he needs a colonoscopy Takes miralax once a day  Asthma is well controlled Continues on the xolair No cough or wheezing  Chronic anxiety Other than bowels--- no other new issues Finds his 2 martinis or glasses of red wine do relax him as well  Has had another bladder botox shot This has helped his bladder more Tolerating the foley better now  No chest pain No palpitations No dizziness or syncope  Current Outpatient Prescriptions on File Prior to Visit  Medication Sig Dispense Refill  . albuterol (VENTOLIN HFA) 108 (90 BASE) MCG/ACT inhaler Inhale 2 puffs into the lungs every 4 (four) hours as needed for wheezing. 18 g 3  . ALPRAZolam (XANAX) 0.5 MG tablet TAKE 1 TABLET BY MOUTH THREE TIMES DAILY AS NEEDED FOR ANXIETY 90 tablet 0  . aspirin 81 MG tablet Take 81 mg by mouth daily.      . Cholecalciferol (VITAMIN D3) 5000 UNITS CAPS Take 1 capsule by mouth daily.    . Coenzyme Q10 (CO Q 10) 100 MG CAPS Take 1 capsule by mouth daily.    Marland Kitchen FOLIC ACID PO Take 7,616 mg by mouth daily.    . furosemide (LASIX) 20 MG tablet TAKE 1 TABLET BY MOUTH EVERY DAY 30 tablet 11  . gabapentin (NEURONTIN) 300 MG capsule TAKE 1 CAPSULE BY MOUTH THREE TIMES DAILY 90 capsule 3  .  hydroxychloroquine (PLAQUENIL) 200 MG tablet Take 200 mg by mouth 2 (two) times daily.     . methotrexate 2.5 MG tablet Take 25 mg by mouth once a week.     . metoprolol succinate (TOPROL-XL) 50 MG 24 hr tablet TAKE 1 TABLET BY MOUTH EVERY DAY 90 tablet 3  . Multiple Vitamin (MULTIVITAMIN) tablet Take 1 tablet by mouth daily.      Marland Kitchen omeprazole (PRILOSEC) 20 MG capsule TAKE 1 CAPSULE BY MOUTH EVERY DAY 90 capsule 3  . polyethylene glycol (GLYCOLAX) packet Take 34 g by mouth daily.     . pravastatin (PRAVACHOL) 20 MG tablet TAKE 1 TABLET BY MOUTH EVERY DAY 90 tablet 3  . PREVIDENT 5000 DRY MOUTH 1.1 % PSTE Use as directed    . solifenacin (VESICARE) 5 MG tablet Take 5 mg by mouth daily. For bladder spasms     . traMADol (ULTRAM) 50 MG tablet TAKE 1 TABLET BY MOUTH FOUR TIMES DAILY 120 tablet 0   No current facility-administered medications on file prior to visit.    Allergies  Allergen Reactions  . Ciprofloxacin Nausea And Vomiting    Headache  . Citalopram Hydrobromide   . Lorazepam     Adverse reaction  . Paroxetine Nausea Only  . Ramipril   . Simvastatin     Past Medical  History  Diagnosis Date  . CAD (coronary artery disease)   . Neurogenic bladder   . Asthma   . Chronic sinusitis   . Diverticulitis   . GERD (gastroesophageal reflux disease)   . Arthritis     rheumatoid  . Cancer     Prostate  . IBS (irritable bowel syndrome)   . Anxiety   . Neurodermatitis   . ED (erectile dysfunction)   . OSA (obstructive sleep apnea)   . Allergy   . Hypertension   . Depression   . Hx of adenomatous colonic polyps   . History of SIADH   . Hyponatremia     Past Surgical History  Procedure Laterality Date  . Prostate surgery      PROSTATECTOMY  . Knee surgery      ARTHROSCOPY LEFT  . Nasal sinus surgery  2009    DEVIATED SEPTUM AND POLYPS  . Urethral stricture dilatation  02-2010    Dr.Cope  . Total knee arthroplasty Left 9/15    Dr [name]    Family History  Problem  Relation Age of Onset  . Heart disease Mother 47    heart failure  . Pneumonia Father 52    pnemonia    History   Social History  . Marital Status: Married    Spouse Name: N/A  . Number of Children: 2  . Years of Education: N/A   Occupational History  . Radio station owner     retired   Social History Main Topics  . Smoking status: Never Smoker   . Smokeless tobacco: Never Used  . Alcohol Use: Yes  . Drug Use: No  . Sexual Activity: Not on file   Other Topics Concern  . Not on file   Social History Narrative   Not sure about a living will or health care POA   Wife should make health care decisions for him   Would accept resuscitation attempts   Not sure about tube feeds   Review of Systems Appetite is fine Weight is stable    Objective:   Physical Exam  Constitutional: He appears well-developed. No distress.  Neck: Normal range of motion. No thyromegaly present.  Cardiovascular: Normal rate, regular rhythm and normal heart sounds.  Exam reveals no gallop.   No murmur heard. Pulmonary/Chest: Effort normal and breath sounds normal. No respiratory distress. He has no wheezes. He has no rales.  Abdominal: Soft. He exhibits no distension. There is no tenderness. There is no rebound and no guarding.  Musculoskeletal: He exhibits no edema.  Lymphadenopathy:    He has no cervical adenopathy.  Psychiatric:  Usual anxiety No depressed          Assessment & Plan:

## 2015-01-26 NOTE — Assessment & Plan Note (Signed)
Ongoing anxiety Does get relief from the alprazolam

## 2015-01-26 NOTE — Assessment & Plan Note (Signed)
Due for his yearly PSA

## 2015-01-26 NOTE — Assessment & Plan Note (Signed)
Biggest anxiety producer for him Goes frequently with miralax but doesn't feel he empties Wants colonoscopy but probably doesn't need this Will add senna-S (or biscodyl) as stimulant before sending him back to Dr Fuller Plan

## 2015-01-27 LAB — CBC WITH DIFFERENTIAL/PLATELET
BASOS ABS: 0 10*3/uL (ref 0.0–0.1)
BASOS PCT: 0.3 % (ref 0.0–3.0)
Eosinophils Absolute: 0.3 10*3/uL (ref 0.0–0.7)
Eosinophils Relative: 4.5 % (ref 0.0–5.0)
HCT: 36.7 % — ABNORMAL LOW (ref 39.0–52.0)
Hemoglobin: 12.3 g/dL — ABNORMAL LOW (ref 13.0–17.0)
LYMPHS ABS: 1.4 10*3/uL (ref 0.7–4.0)
Lymphocytes Relative: 24.9 % (ref 12.0–46.0)
MCHC: 33.5 g/dL (ref 30.0–36.0)
MCV: 88.2 fl (ref 78.0–100.0)
MONOS PCT: 9.9 % (ref 3.0–12.0)
Monocytes Absolute: 0.6 10*3/uL (ref 0.1–1.0)
Neutro Abs: 3.5 10*3/uL (ref 1.4–7.7)
Neutrophils Relative %: 60.4 % (ref 43.0–77.0)
PLATELETS: 174 10*3/uL (ref 150.0–400.0)
RBC: 4.17 Mil/uL — ABNORMAL LOW (ref 4.22–5.81)
RDW: 18.5 % — ABNORMAL HIGH (ref 11.5–15.5)
WBC: 5.8 10*3/uL (ref 4.0–10.5)

## 2015-01-27 LAB — LIPID PANEL
CHOL/HDL RATIO: 2
Cholesterol: 156 mg/dL (ref 0–200)
HDL: 62.6 mg/dL (ref >39.00)
LDL Cholesterol: 82 mg/dL (ref 0–99)
NonHDL: 93.4
Triglycerides: 56 mg/dL (ref 0.0–149.0)
VLDL: 11.2 mg/dL (ref 0.0–40.0)

## 2015-01-27 LAB — COMPREHENSIVE METABOLIC PANEL
ALBUMIN: 4.1 g/dL (ref 3.5–5.2)
ALT: 20 U/L (ref 0–53)
AST: 24 U/L (ref 0–37)
Alkaline Phosphatase: 50 U/L (ref 39–117)
BUN: 19 mg/dL (ref 6–23)
CALCIUM: 9.2 mg/dL (ref 8.4–10.5)
CHLORIDE: 99 meq/L (ref 96–112)
CO2: 32 mEq/L (ref 19–32)
Creatinine, Ser: 0.89 mg/dL (ref 0.40–1.50)
GFR: 87.71 mL/min (ref >60.00)
GLUCOSE: 84 mg/dL (ref 70–99)
POTASSIUM: 4.8 meq/L (ref 3.5–5.1)
Sodium: 136 mEq/L (ref 135–145)
Total Bilirubin: 0.5 mg/dL (ref 0.2–1.2)
Total Protein: 6.3 g/dL (ref 6.0–8.3)

## 2015-01-27 LAB — PSA: PSA: 0.01 ng/mL — ABNORMAL LOW (ref 0.10–4.00)

## 2015-01-27 LAB — T4, FREE: FREE T4: 1.02 ng/dL (ref 0.60–1.60)

## 2015-01-28 ENCOUNTER — Ambulatory Visit (INDEPENDENT_AMBULATORY_CARE_PROVIDER_SITE_OTHER): Payer: Medicare PPO

## 2015-01-28 DIAGNOSIS — J4540 Moderate persistent asthma, uncomplicated: Secondary | ICD-10-CM | POA: Diagnosis not present

## 2015-01-29 MED ORDER — OMALIZUMAB 150 MG ~~LOC~~ SOLR
375.0000 mg | Freq: Once | SUBCUTANEOUS | Status: AC
  Administered 2015-01-29: 375 mg via SUBCUTANEOUS

## 2015-02-09 ENCOUNTER — Other Ambulatory Visit: Payer: Self-pay | Admitting: Internal Medicine

## 2015-02-09 NOTE — Telephone Encounter (Signed)
Received refill request electronically from pharmacy. Last refill 12/22/14 #120, last office visit 01/26/15. Is it okay to refill medication?

## 2015-02-09 NOTE — Telephone Encounter (Signed)
Approved: #[id number] x 0 

## 2015-02-10 NOTE — Telephone Encounter (Signed)
Called to [facility] [address] [place]. 

## 2015-02-12 ENCOUNTER — Ambulatory Visit: Payer: Medicare PPO

## 2015-02-13 ENCOUNTER — Ambulatory Visit (INDEPENDENT_AMBULATORY_CARE_PROVIDER_SITE_OTHER): Payer: Medicare PPO

## 2015-02-13 DIAGNOSIS — J4520 Mild intermittent asthma, uncomplicated: Secondary | ICD-10-CM | POA: Diagnosis not present

## 2015-02-18 MED ORDER — OMALIZUMAB 150 MG ~~LOC~~ SOLR
375.0000 mg | Freq: Once | SUBCUTANEOUS | Status: AC
  Administered 2015-02-13: 375 mg via SUBCUTANEOUS

## 2015-02-25 ENCOUNTER — Other Ambulatory Visit: Payer: Self-pay | Admitting: Internal Medicine

## 2015-02-25 NOTE — Telephone Encounter (Signed)
Xanax called into pharmacy.

## 2015-02-25 NOTE — Telephone Encounter (Signed)
Approved: #[id number] x 0 

## 2015-02-25 NOTE — Telephone Encounter (Signed)
01/26/2015 

## 2015-03-02 ENCOUNTER — Ambulatory Visit (INDEPENDENT_AMBULATORY_CARE_PROVIDER_SITE_OTHER): Payer: Medicare PPO

## 2015-03-02 DIAGNOSIS — J4540 Moderate persistent asthma, uncomplicated: Secondary | ICD-10-CM | POA: Diagnosis not present

## 2015-03-03 ENCOUNTER — Ambulatory Visit: Payer: Medicare PPO

## 2015-03-04 MED ORDER — OMALIZUMAB 150 MG ~~LOC~~ SOLR
375.0000 mg | Freq: Once | SUBCUTANEOUS | Status: AC
  Administered 2015-03-02: 375 mg via SUBCUTANEOUS

## 2015-03-06 ENCOUNTER — Telehealth: Payer: Self-pay | Admitting: Pulmonary Disease

## 2015-03-06 NOTE — Telephone Encounter (Signed)
Per Joellen Jersey- she is aware of denial and is currently in the process of getting med approved.   Called and spoke to pt. Informed pt of the update. Pt aware Joellen Jersey will give him a call with further information.   Will forward to [facility] to follow.

## 2015-03-09 ENCOUNTER — Telehealth: Payer: Self-pay | Admitting: Pulmonary Disease

## 2015-03-09 NOTE — Telephone Encounter (Signed)
Spoke with pt, states he's been on Xolair X3 years, states he received a letter from [facility] on Friday stating that they are not covering his Xolair because "items are not reasonable and necessary".    [name] please advise.  Thank you.  Please call pt on his cell #.

## 2015-03-09 NOTE — Telephone Encounter (Signed)
Calling back [phone]

## 2015-03-10 NOTE — Telephone Encounter (Signed)
Spoke with [employer]-PA has been started and placed in Clinical review-can take 24 hrs to 72 hours for decision. Pt is aware of this and aware I will keep him updated.   Ref# [id number]

## 2015-03-10 NOTE — Telephone Encounter (Signed)
Pt calling stating that ins denied his xolair and is requesting to speak to [facility], has been waiting on a call for the last three days, spoke w Joellen Jersey and she said that she would give him a call this evening when she returns.Hillery Hunter

## 2015-03-10 NOTE — Telephone Encounter (Signed)
See phone note dated 03-06-15.

## 2015-03-10 NOTE — Telephone Encounter (Signed)
[name] please advise. Thanks. 

## 2015-03-12 ENCOUNTER — Other Ambulatory Visit: Payer: Self-pay | Admitting: Internal Medicine

## 2015-03-16 NOTE — Telephone Encounter (Signed)
Pt is returning call - [phone]

## 2015-03-16 NOTE — Telephone Encounter (Signed)
Pt. Is returning call in regards to his appt.tomorrow for Xolair   [phone]

## 2015-03-16 NOTE — Telephone Encounter (Signed)
LMTCB for patient-he will need to speak directly with me please. His Arvid Right is being denied through insurance and will require an appeal to be done. We are currently working on this and patient should wait on getting his Xolair injections tomorrow if he does not want to get a bill.

## 2015-03-17 ENCOUNTER — Ambulatory Visit: Payer: Medicare PPO

## 2015-03-17 NOTE — Telephone Encounter (Signed)
[name] please advise. Thanks. 

## 2015-03-18 NOTE — Telephone Encounter (Signed)
Pt calling to check on status of form (PA) SPOKE w/[name] and was told to tell pt that she is working w/Dr to get forms done, pt was satisfied in knowing that the forms had been received.[name]

## 2015-03-18 NOTE — Telephone Encounter (Signed)
Pt was made aware late Monday evening that I had received a denial status from his insurance and would proceed with appeal process (72 hours for decision) once VS was back in the office this afternoon.   The appeal process requires we send a letter from the MD stating why the patient needs Xolair, patients name, address, reason for appeal, and any other clinical information to help in making their decision. We also must include all medications patient has tired and failed, as well as medications he is currently using along with Xolair and medications he will continue to use while on Xolair.    VS please advise on what you would like the letter to state and I will out in formal letter to send with clinical OV notes as well.   I will need this today. Thanks.

## 2015-03-19 ENCOUNTER — Encounter: Payer: Self-pay | Admitting: Pulmonary Disease

## 2015-03-19 NOTE — Telephone Encounter (Signed)
I have written letter with required information.  Patient is overdue for ROV - last seen for ROV on 10/30/13.  Office visits since then have been for xolair injections.  Please schedule for ROV with either me or Tammy Parrett.

## 2015-03-20 NOTE — Telephone Encounter (Signed)
Pt called back  Let him know about the appeal letter and making an appointment.[name]

## 2015-03-20 NOTE — Telephone Encounter (Signed)
Patient needs follow up appointment with VS or TP.  Left message for patient to call back to schedule appointment. [place] for front desk to tell patient Dr. Halford Chessman sent letter of appeal for Xolair, but patient needs appointment.

## 2015-03-21 NOTE — Discharge Summary (Signed)
PATIENT NAME:  [name], [name] MR#:  [id number] DATE OF BIRTH:  [birth year]  DATE OF ADMISSION:  08/20/2014 DATE OF DISCHARGE:  08/23/2014  ADMITTING DIAGNOSIS: Degenerative arthrosis of the left knee superimposed on rheumatoid arthritis.   DISCHARGE DIAGNOSIS: Degenerative arthrosis of the left knee superimposed on rheumatoid arthritis.   HISTORY: The patient is a 81 year old gentleman who has been followed at [facility] for progression of left knee pain. The patient had reported a long history of bilateral knee pain with left being more symptomatic than the right at the surgical time. The patient does have a remote history of left knee arthroscopy. The patient has received multiple cortisone injections as well as minimal improvement with the symptoms following the injection. The patient has also been diagnosed with rheumatoid arthritis and is currently taking methotrexate as well as Plaquenil. The patient's pain was noted to be aggravated with weight-bearing activities. The patient had reported some start-up stiffness. He was having difficulty with arising from a sitting position as well as episodes of limited motion of the left knee. At the time of surgery, he was using a walker for ambulation. He had been unable to exercise secondary to increased pain. The left knee pain had progressed to the point that it was significantly interfering with his activities of daily living. X-rays taken in [place] showed severe tricompartmental arthritic changes. He was noted to have bone-on-bone to the medial compartment space. There was some erosion to the tibial plateau. There was diffuse subchondral sclerosis as well as osteophyte formation. After discussion of the risks and benefits of surgical intervention, the patient expressed his understanding of the risks and benefits and agreed for plans for surgical intervention.   PROCEDURE: Left total knee arthroplasty using computer-assisted navigation  with autologous bone grafting of the medial femoral condyle and medial aspect of the tibial plateau secondary to cyst formation.   ANESTHESIA: Spinal.   SOFT TISSUE RELEASE: Anterior cruciate ligament, posterior cruciate ligament, deep and superficial medial collateral ligaments, as well as patellofemoral ligament.   IMPLANTS UTILIZED: DePuy PFC Sigma size 4 posterior stabilized femoral component (cemented), size 5 MBT tibial component (cemented), a 38 mm three-pegged oval dome patella (cemented), and a 10 mm stabilized rotating platform polyethylene insert. Gentamicin bone cement was utilized due to the patient's history of rheumatoid arthritis.   HOSPITAL COURSE: The patient tolerated the procedure very well. He had no complications. He was then taken to the PAC-U where he was stabilized and then transferred to the orthopedic floor. He began receiving anticoagulation therapy of Lovenox 30 mg subcutaneous every 12 hours per anesthesia and pharmacy protocol. He was fitted with TED stockings bilaterally. These were allowed to be removed 1 hour per 8 hour shift. The left one was applied on day 2 following removal of the Hemovac and dressing change. The patient was also fitted with the AVI compression foot pumps bilaterally set at 80 mmHg. His calves have been nontender. There has been no evidence of any DVTs. He was noted to have some edema of the lower extremity secondary to the patient refusing to take his Lasix when it was prescribed.   The patient's vital signs have been stable. He has been afebrile. Hemodynamically he was stable and no transfusions were given other than the Autovac transfusion given the first 6 hours postoperatively.   Physical therapy was initiated on day 1 for gait training and transfers. This has been very slow. The patient was sort of wanting to rule his treatment  plan while in the hospital. He liked to dictate everything that is done. He questions everything that has been done as  well. Progression has been very slow. Occupational therapy was also initiated on day 1 for ADL and assistive devices.   The patient's IV and dressing change was done on day 2 along with reapplication of the Polar Care. There was diffuse drainage to the wound secondary to the Hemovac discontinuing its function. However, after removal of the primary dressing the bleeding seemed to discontinue and was doing very well. The Foley is a permanently indwelling catheter that is monitored by his urologist secondary to his previous prostatectomy.  DISPOSITION AND  DISCHARGE INSTRUCTIONS: The patient is being discharged to a skilled nursing facility in improved stable condition. He is to continue weight-bearing as tolerated. Continue using a walker until cleared by physical therapy to go to a quad cane. He will continue with PT for gait training and transfers. OT for ADL and assistive devices. Elevate the heels off the bed. Continue TED stockings. These are allowed to be removed 1 hour per 8 hour shift. Incentive spirometer q. 1 hour while awake. Encourage cough and deep breathing q. 2 hours while awake. He was placed on a diabetic diet. Continue Polar Care maintaining a temperature of 40 to 50 degrees Fahrenheit. Change dressing as needed. He has a follow-up appointment on October 8th at 10:00 p.m. He is not to get the incision wet until the staples are removed at that time.   DRUG ALLERGIES: Cipro, lorazepam,  Paxil, ramipril, simvastatin.   DISCHARGE MEDICATIONS: 1.  Vitamin D3 5,000 units daily.  2.  Senokot-S 1 tablet b.i.d.  3.  Folic acid 937 mcg daily. 4.  Lasix 20 mg daily. 5.  Gabapentin 300 mg t.i.d. 6.  Metoprolol 50 mg daily. 7.  One multivitamin capsule daily. 8.  Pantoprazole 40 mg b.i.d.  9.  Pravachol 20 mg daily. 10.  VESIcare 5 daily. 11.  Lovenox 30 mg subcutaneous q. 12 hours for 14 days and then discontinue and begin taking one 81 mg enteric-coated aspirin.  12.  Albuterol oral inhaler  2 puffs q. 6 hours with spacer. 13.  Tylenol ES 500 to 1000 mg every 4 hours p.r.n. for fevers greater than 100.4. 14.  Alprazolam 0.5 mg every 6 hours p.r.n.  15.  Mylanta DS 30 mL every 6 hours p.r.n. 16.  Dulcolax suppositories 10 mg rectally for constipation if no results with milk of magnesia. 17.  Milk of magnesia 30 mL b.i.d. 18.  Oxycodone 5 to 10 mg every 4 to 6 hours p.r.n. for pain. 19.  Tramadol 50 to 100 mg every 4 hours p.r.n. for pain.  20.  Enema soapsuds if no results with milk of magnesia or Dulcolax.   PAST MEDICAL HISTORY: Anxiety, arthritis, depression, hyperlipidemia, rheumatoid arthritis, prostate cancer, shingles, neurogenic bladder, osteoarthritis, COPD, asthma, coronary artery disease, hypertension, hypernatremia.  ____________________________ Vance Peper, PA [id number] D: 08/22/2014 08:34:04 ET T: 08/22/2014 09:05:01 ET JOB#: [id number]  cc: Vance Peper, PA, <Dictator> Kyzen Horn PA ELECTRONICALLY SIGNED 08/24/2014 19:10

## 2015-03-21 NOTE — Op Note (Signed)
PATIENT NAME:  [name], DARLING MR#:  [id number] DATE OF BIRTH:  Jan 15, 1934  DATE OF PROCEDURE:  08/20/2014  PREOPERATIVE DIAGNOSIS: Degenerative arthrosis of the left knee superimposed on rheumatoid arthritis.   POSTOPERATIVE DIAGNOSIS: Degenerative arthrosis of the left knee superimposed on rheumatoid arthritis.   PROCEDURE PERFORMED: Left total knee arthroplasty using computer-assisted navigation with autologous bone grafting of the medial femoral condyle and medial aspect of the tibial plateau.   SURGEON: Dr. Skip Estimable   ASSISTANT: Vance Peper, PA (required to maintain retraction throughout the procedure).   ANESTHESIA: Spinal.   ESTIMATED BLOOD LOSS: 50 mL.   FLUIDS REPLACED: 1200 mL of crystalloid.  TOURNIQUET TIME: 118 minutes.   DRAINS: Two medium drains to reinfusion system.   SOFT TISSUE RELEASES: Anterior cruciate ligament, posterior cruciate ligament, deep and superficial medial collateral ligament and patellofemoral ligament.   IMPLANTS UTILIZED: DePuy PFC Sigma size 4 posterior stabilized femoral component (cemented), size 5 MBT tibial component (cemented), a 38 mm 3 peg oval dome patella (cemented), and a 10 mm stabilized rotating platform polyethylene insert. Gentamicin bone cement was utilized due to the patient's history of rheumatoid arthritis.   INDICATIONS FOR SURGERY: The patient is a 81 year old male who has been seen for complaints of progressive left knee pain. X-rays demonstrated severe degenerative changes with bone-on-bone articulation noted in both the medial and lateral compartments. After discussion of the risks and benefits of surgical intervention, the patient expressed understanding of the risks, benefits, and agreed with plans for surgical intervention.   PROCEDURE IN DETAIL: The patient was brought to the operating room and, after adequate spinal anesthesia was achieved, a tourniquet was placed on the patient's upper left thigh. The patient's left knee  and leg were cleaned and prepped with alcohol and DuraPrep and draped in the usual sterile fashion. A "timeout" was performed as per usual protocol. The left lower extremity was exsanguinated using an Esmarch, and the tourniquet was inflated to 300 mmHg an anterior longitudinal incision was made followed by a standard mid vastus approach. A large effusion was evacuated. The deep fibers of the medial collateral ligament were elevated in a subperiosteal fashion off the medial flare of the tibia so as to maintain a continuous soft tissue sleeve. The patella was subluxed laterally and the patellofemoral ligament was incised. Inspection of the knee demonstrated a full-thickness loss of articular cartilage both medial, lateral and patellofemoral articulations. Prominent osteophytes were debrided using a rongeur. Anterior and posterior cruciate ligaments were excised. Two 4.0 mm Schanz pins were inserted into the femur and into the tibia for attachment of the array of trackers used for computer-assisted navigation. Hip center was identified using circumduction technique. Distal landmarks were mapped using the computer. The distal femur and proximal tibia were mapped using the computer. Distal femoral cutting guide was positioned using computer-assisted navigation so as to achieve a 5 degrees distal valgus cut. Cut was performed and verified using the computer. Distal femur was sized and it was felt that a size 4 femoral component was appropriate. A size 4 cutting guide was positioned and anterior cut was performed and verified using the computer. This was followed by completion of the posterior and chamfer cuts. Femoral cutting guide for the central box was positioned, and the central box cut was performed/ Attention was then directed to the proximal tibia. Medial and lateral menisci were excised. The extramedullary tibial cutting guide was positioned using computer-assisted navigation so as to achieve 0 degrees varus  valgus alignment and 0  degrees posterior slope. Cut was performed and verified using the computer. The proximal tibia was sized and it was felt that a size 5 tibial tray was appropriate. Tibial and femoral trials were inserted followed by insertion of a 10 mm polyethylene trial. The knee was felt to be tight both in flexion and extension. Trial components were removed and the extramedullary tibial cutting guide was repositioned so as to remove an additional 2 mm of bone. Cut was performed and verified using the computer. Trial components were placed followed by placement of a 10 mm polyethylene trial. The knee was felt to be tight medially. A Cobb elevator was used to elevate the superficial fibers of the medial collateral ligament. This allowed for excellent mediolateral soft tissue balancing both in full extension and in flexion. Finally, the patella was cut and prepared so as to accommodate a 38 mm 3 peg oval dome patella. Patellar trial was placed and the knee was placed through a range of motion with excellent patellar tracking appreciated. The femoral trial was removed. Central post hole for the tibial component was reamed, followed by insertion of a keel punch. At this point curettes were used to debride cystic lesions from the medial femoral condyle, medial tibial plateau, and the patella. These lesions were contained. Cancellous bone graft from the previous bony cuts was then morselized and packed into position. Polymethylmethacrylate cement with gentamicin was prepared in the usual fashion using a vacuum mixer. Cement was applied to the proximal tibia as well as along the undersurface of a size 5 MBT tibial component. The tibial component was positioned and impacted into place. Excess cement was removed using freer elevators. Cement was then applied to the cut surface of the femur as well as along the posterior flanges of a size 4 posterior stabilized femoral component. Femoral component was positioned and  impacted into place. Excess cement was removed using freer elevators. A 10 mm polyethylene trial was inserted and the knee was brought in full extension with steady axial compression applied. Finally, cement was applied to the cut surface to the backside of a 38 mm 3 peg oval dome patella, and the patellar component was positioned and patellar clamp applied. Excess cement was removed using freer elevators. After adequate curing of cement, the tourniquet was deflated after a total tourniquet time of 118 minutes. Hemostasis was achieved using electrocautery. The knee was irrigated with copious amounts of normal saline with antibiotic solution using pulsatile lavage and then suctioned dry. The knee was then inspected for any residual cement debris. A 20 mL of 1.3% Exparel and  40 mL of normal saline was injected along the posterior capsule, medial and lateral gutters, and along the arthrotomy site. A 10 mm stabilized rotating platform polyethylene insert was placed and the knee was placed through a range of motion with excellent patellar tracking appreciated and excellent mediolateral soft tissue balancing both in full extension and in flexion. Two medium drains were placed in the wound bed and brought out through a separate stab incision. The medial parapatellar portion of the incision was reapproximated using interrupted sutures of #1 Vicryl. The subcutaneous tissue was approximated in layers using first #0 Vicryl followed by 2-0 Vicryl. Then, 30 mL of 0.25% Marcaine with epinephrine was injected along the incision site into the subcutaneous tissue. Skin was closed with skin staples. A sterile dressing was applied. The patient tolerated the procedure well. He was transported to the recovery room in stable condition.     ____________________________ Jeneen Rinks  Evalina Field., MD jph:JT D: 08/21/2014 00:20:13 ET T: 08/21/2014 01:01:08 ET JOB#: [id number]  cc: Laurice Record. Holley Bouche., MD, <Dictator> JAMES P Holley Bouche  MD ELECTRONICALLY SIGNED 08/25/2014 7:04

## 2015-03-23 ENCOUNTER — Ambulatory Visit: Payer: Medicare PPO

## 2015-03-23 NOTE — Telephone Encounter (Signed)
Pt scheduled for OV with TP 03/31/15 at 3:15pm Nothing further needed.

## 2015-03-24 ENCOUNTER — Other Ambulatory Visit: Payer: Self-pay | Admitting: Internal Medicine

## 2015-03-24 NOTE — Telephone Encounter (Signed)
02/25/15 

## 2015-03-24 NOTE — Telephone Encounter (Signed)
rx called into pharmacy

## 2015-03-24 NOTE — Telephone Encounter (Signed)
Approved: #[id number] x 0 

## 2015-03-25 ENCOUNTER — Telehealth: Payer: Self-pay | Admitting: Internal Medicine

## 2015-03-25 ENCOUNTER — Other Ambulatory Visit: Payer: Self-pay | Admitting: Internal Medicine

## 2015-03-26 ENCOUNTER — Ambulatory Visit (INDEPENDENT_AMBULATORY_CARE_PROVIDER_SITE_OTHER): Payer: Medicare PPO

## 2015-03-26 DIAGNOSIS — J4520 Mild intermittent asthma, uncomplicated: Secondary | ICD-10-CM

## 2015-03-26 NOTE — Telephone Encounter (Signed)
Approved: #[id number] x 0 

## 2015-03-26 NOTE — Telephone Encounter (Signed)
I spoke with patient today-he is aware the letter he received pertains to his approval for Xolair benefits-he is also aware of [employer] trying to back date the approval to 12-12-14. Pt is aware that I will contact [employer] back as I have not heard from them as of today. I will keep patient updated as I receive new information.

## 2015-03-26 NOTE — Telephone Encounter (Signed)
02/10/15 

## 2015-03-26 NOTE — Telephone Encounter (Signed)
rx called into pharmacy

## 2015-03-27 MED ORDER — OMALIZUMAB 150 MG ~~LOC~~ SOLR
375.0000 mg | Freq: Once | SUBCUTANEOUS | Status: AC
  Administered 2015-03-26: 375 mg via SUBCUTANEOUS

## 2015-03-31 ENCOUNTER — Ambulatory Visit (INDEPENDENT_AMBULATORY_CARE_PROVIDER_SITE_OTHER)
Admission: RE | Admit: 2015-03-31 | Discharge: 2015-03-31 | Disposition: A | Discharge status: Home / Self Care | Payer: Medicare PPO | Source: Ambulatory Visit | Attending: Adult Health | Admitting: Adult Health

## 2015-03-31 ENCOUNTER — Encounter: Payer: Self-pay | Admitting: Adult Health

## 2015-03-31 ENCOUNTER — Ambulatory Visit (INDEPENDENT_AMBULATORY_CARE_PROVIDER_SITE_OTHER): Payer: Medicare PPO | Admitting: Adult Health

## 2015-03-31 VITALS — BP 126/74 | HR 68 | Temp 98.0°F | Ht 71.0 in | Wt 228.2 lb

## 2015-03-31 DIAGNOSIS — J4520 Mild intermittent asthma, uncomplicated: Secondary | ICD-10-CM | POA: Diagnosis not present

## 2015-03-31 DIAGNOSIS — J329 Chronic sinusitis, unspecified: Secondary | ICD-10-CM

## 2015-03-31 NOTE — Patient Instructions (Addendum)
Continue on current regimen.  Chest xray today .  follow up Dr. Halford Chessman  In 6 months and As needed

## 2015-03-31 NOTE — Progress Notes (Signed)
  Subjective:    Patient ID: [name], male    DOB: Dec 04, 1933, 81 y.o.   MRN: [id number]  HPI 81 yo male with known hx of  asthma, allergic rhinitis on xolair. RA on MTX. And Plaquenil   03/31/2015 Follow up  Patient returns for followup for his asthma. Patient reports that he is doing exceptionally well. Continues on Xolair twice monthly. Did have insurance issues with coverage for Xolair , seems to be working this out for coverage.  Has rare use of his Ventolin inhaler. Denies any wheezing, cough, nocturnal symptoms Has had no emergency room or hospitalizations  visits. Has had no prednisone tapers for lung issues  Had knee replacement in 07/2014 very happy with sucessfull results.  (Dr. [name] in [facility] ) .   Has chronic foley due to prostate issues.    Review of Systems Constitutional:   No  weight loss, night sweats,  Fevers, chills,  +fatigue, or  lassitude.  HEENT:   No headaches,  Difficulty swallowing,  Tooth/dental problems, or  Sore throat,                No sneezing, itching, ear ache,  +nasal congestion  CV:  No chest pain,  Orthopnea, PND, swelling in lower extremities, anasarca, dizziness, palpitations, syncope.   GI  No heartburn, indigestion, abdominal pain, nausea, vomiting, diarrhea, change in bowel habits, loss of appetite, bloody stools.   Resp: No shortness of breath with exertion or at rest.  No excess mucus, no productive cough,  No non-productive cough,  No coughing up of blood.  No change in color of mucus.  No wheezing.  No chest wall deformity  Skin: no rash or lesions.  GU: no dysuria, change in color of urine, no urgency or frequency.  No flank pain, no hematuria   psych:  No change in mood or affect. No depression or anxiety.  No memory loss.         Objective:   Physical Exam GEN: A/Ox3; pleasant , NAD, elderly   HEENT:  [place]/AT,  EACs-clear, TMs-wnl, NOSE-clear, THROAT-clear, no lesions, no postnasal drip or exudate noted.   NECK:   Supple w/ fair ROM; no JVD; normal carotid impulses w/o bruits; no thyromegaly or nodules palpated; no lymphadenopathy.  RESP  Clear  P & A; w/o, wheezes/ rales/ or rhonchi.no accessory muscle use, no dullness to percussion  CARD:  RRR, no m/r/g  , tr peripheral edema, pulses intact, no cyanosis or clubbing.  GI:   Soft & nt; nml bowel sounds; no organomegaly or masses detected.  Musco: Warm bil, no deformities or joint swelling noted.   Neuro: alert, no focal deficits noted.    Skin: Warm, no lesions or rashes   CXR 03/31/15  Reviewed independently and agree  Mild enlargement of cardiac silhouette.  Bronchitic changes and slight hyperinflation consistent with history of asthma.       Assessment & Plan:

## 2015-04-03 ENCOUNTER — Telehealth: Payer: Self-pay | Admitting: Internal Medicine

## 2015-04-03 NOTE — Telephone Encounter (Signed)
Pt is aware that [employer] has up to 30 days to back date his [place] approval for Xolair to 12-12-14. Pt is aware that I will call update him as I get new information.

## 2015-04-06 ENCOUNTER — Telehealth: Payer: Self-pay | Admitting: Pulmonary Disease

## 2015-04-06 NOTE — Assessment & Plan Note (Signed)
Controlled without flare   

## 2015-04-06 NOTE — Assessment & Plan Note (Addendum)
Doing well on current regimen  Our office is working with pt regarding Insurance issues with Xolair, it appears to be worked out.  There are some back charge issues but hopefully this will be worked out as well.  Check cxr on MTX /Plaquenil   Plan :  Continue on current regimen.  Chest xray today .  follow up Dr. Halford Chessman  In 6 months and As needed

## 2015-04-09 NOTE — Telephone Encounter (Signed)
#   vials:6 Ordered date:04/06/15 Shipping Date:04/07/15  # Vials:6 Arrival Date:04/07/15 Lot [id number] Exp Date:12/19

## 2015-04-10 ENCOUNTER — Ambulatory Visit (INDEPENDENT_AMBULATORY_CARE_PROVIDER_SITE_OTHER): Payer: Medicare PPO

## 2015-04-10 DIAGNOSIS — J4540 Moderate persistent asthma, uncomplicated: Secondary | ICD-10-CM | POA: Diagnosis not present

## 2015-04-12 ENCOUNTER — Other Ambulatory Visit: Payer: Self-pay | Admitting: Internal Medicine

## 2015-04-13 MED ORDER — OMALIZUMAB 150 MG ~~LOC~~ SOLR
375.0000 mg | Freq: Once | SUBCUTANEOUS | Status: AC
  Administered 2015-04-10: 375 mg via SUBCUTANEOUS

## 2015-04-14 ENCOUNTER — Other Ambulatory Visit: Payer: Self-pay | Admitting: Internal Medicine

## 2015-04-23 ENCOUNTER — Other Ambulatory Visit: Payer: Self-pay | Admitting: Internal Medicine

## 2015-04-23 NOTE — Telephone Encounter (Signed)
Approved: #[id number] x 0 

## 2015-04-23 NOTE — Telephone Encounter (Signed)
Last filled 03/24/2015 #90 with no refills--please advise

## 2015-04-24 NOTE — Telephone Encounter (Signed)
Rx called to pharmacy

## 2015-04-28 ENCOUNTER — Ambulatory Visit: Payer: Medicare PPO

## 2015-04-29 ENCOUNTER — Ambulatory Visit (INDEPENDENT_AMBULATORY_CARE_PROVIDER_SITE_OTHER): Payer: Medicare PPO

## 2015-04-29 DIAGNOSIS — J4540 Moderate persistent asthma, uncomplicated: Secondary | ICD-10-CM

## 2015-04-29 NOTE — Telephone Encounter (Signed)
As noted previously-this can take up to 1 month-pt made aware of this and understands that after 30 days I will call insurance company and seek out approval for backdating Xolair approval.

## 2015-04-29 NOTE — Telephone Encounter (Signed)
What is the status of this? Thanks. 

## 2015-04-30 MED ORDER — OMALIZUMAB 150 MG ~~LOC~~ SOLR
375.0000 mg | Freq: Once | SUBCUTANEOUS | Status: AC
  Administered 2015-04-29: 375 mg via SUBCUTANEOUS

## 2015-05-05 DIAGNOSIS — N319 Neuromuscular dysfunction of bladder, unspecified: Secondary | ICD-10-CM | POA: Diagnosis not present

## 2015-05-05 DIAGNOSIS — N3281 Overactive bladder: Secondary | ICD-10-CM | POA: Diagnosis not present

## 2015-05-11 ENCOUNTER — Other Ambulatory Visit: Payer: Self-pay | Admitting: Internal Medicine

## 2015-05-12 ENCOUNTER — Telehealth: Payer: Self-pay | Admitting: Pulmonary Disease

## 2015-05-12 NOTE — Telephone Encounter (Signed)
I spoke with patients insurance-Insurance stated that the patient's approval for Xolair was only backed dated to 12-29-14 and would need to fill out additional paperwork to seek 12-12-14 date approval. I have requested they fax paperwork to my attn to front fax number. Left message for patient to call me back after 8:30am on Wednesday 05-13-15 to inform him of this.

## 2015-05-12 NOTE — Telephone Encounter (Signed)
Patient says that Joellen Jersey is working on getting approval dates on his Xolair changed.  Wants to know if Joellen Jersey has any updates.  [name] - please advise.

## 2015-05-13 NOTE — Telephone Encounter (Signed)
Pt called back-he is aware of the good news and understands I am working with his insurance to cover 12-12-14 Xolair injection as well. Pt aware that I will keep him updated.

## 2015-05-14 ENCOUNTER — Ambulatory Visit (INDEPENDENT_AMBULATORY_CARE_PROVIDER_SITE_OTHER): Payer: Medicare PPO

## 2015-05-14 DIAGNOSIS — J4520 Mild intermittent asthma, uncomplicated: Secondary | ICD-10-CM | POA: Diagnosis not present

## 2015-05-15 DIAGNOSIS — R600 Localized edema: Secondary | ICD-10-CM | POA: Diagnosis not present

## 2015-05-15 DIAGNOSIS — E871 Hypo-osmolality and hyponatremia: Secondary | ICD-10-CM | POA: Diagnosis not present

## 2015-05-15 MED ORDER — OMALIZUMAB 150 MG ~~LOC~~ SOLR
375.0000 mg | Freq: Once | SUBCUTANEOUS | Status: AC
  Administered 2015-05-14: 375 mg via SUBCUTANEOUS

## 2015-05-15 NOTE — Telephone Encounter (Signed)
[name] - is this done?  Can we close encounter?  Please advise.

## 2015-05-18 ENCOUNTER — Other Ambulatory Visit: Payer: Self-pay | Admitting: Internal Medicine

## 2015-05-18 DIAGNOSIS — R194 Change in bowel habit: Secondary | ICD-10-CM

## 2015-05-19 NOTE — Telephone Encounter (Signed)
Just received a fax stating that the patients injection(s) given on 12-12-14 has been approved for coverage.

## 2015-05-19 NOTE — Telephone Encounter (Signed)
Pt aware of approval and paper sent to scan in EPIC. Nothing more needed at this time.

## 2015-05-22 ENCOUNTER — Telehealth: Payer: Self-pay | Admitting: Internal Medicine

## 2015-05-22 NOTE — Telephone Encounter (Signed)
04/24/2015 

## 2015-05-23 NOTE — Telephone Encounter (Signed)
Approved:  Okay #[id number] x 0 Check his last wellness and have him schedule next one if due within the next 6 months

## 2015-05-25 NOTE — Telephone Encounter (Signed)
Pt left v/m requesting status of alprazolam refill. Unable to reach pt by phone.

## 2015-05-25 NOTE — Telephone Encounter (Signed)
rx called into pharmacy Pt due august, will call to schedule

## 2015-05-29 ENCOUNTER — Ambulatory Visit: Payer: Medicare PPO

## 2015-06-04 ENCOUNTER — Ambulatory Visit (INDEPENDENT_AMBULATORY_CARE_PROVIDER_SITE_OTHER): Payer: Medicare PPO

## 2015-06-04 DIAGNOSIS — J4520 Mild intermittent asthma, uncomplicated: Secondary | ICD-10-CM | POA: Diagnosis not present

## 2015-06-05 ENCOUNTER — Telehealth: Payer: Self-pay | Admitting: Internal Medicine

## 2015-06-05 MED ORDER — OMALIZUMAB 150 MG ~~LOC~~ SOLR
375.0000 mg | Freq: Once | SUBCUTANEOUS | Status: AC
  Administered 2015-06-04: 375 mg via SUBCUTANEOUS

## 2015-06-05 NOTE — Telephone Encounter (Signed)
Patient has a question about a letter he received saying that insurance will cover [place] from 12/12/2014 - 12/28/2014.  (not 2017)   Patient says that he saw [name] yesterday and she told him to call her if it says 2016.  To [name] for follow up

## 2015-06-08 DIAGNOSIS — N3040 Irradiation cystitis without hematuria: Secondary | ICD-10-CM | POA: Diagnosis not present

## 2015-06-08 NOTE — Telephone Encounter (Signed)
Spoke with patient-he is aware that I and Chantel are working on this matter and will keep him updated as able. I am calling [employer] to get them to re-fax the approval and back dates approved to send to billing.

## 2015-06-09 ENCOUNTER — Other Ambulatory Visit: Payer: Self-pay | Admitting: Internal Medicine

## 2015-06-09 ENCOUNTER — Telehealth: Payer: Self-pay | Admitting: Pulmonary Disease

## 2015-06-09 NOTE — Telephone Encounter (Signed)
#   vials:6 Ordered date:06/09/15 Shipping Date:06/10/15

## 2015-06-09 NOTE — Telephone Encounter (Signed)
Chantel attempted to call [employer] for approval letters to be sent back to our office again-no luck with anyone she spoke with and had to hold on the line. We will attempt this call again tomorrow.

## 2015-06-10 NOTE — Telephone Encounter (Signed)
#   Vials:6 Arrival Date:06/10/15 Lot #[id number] Exp Date:1/20

## 2015-06-11 NOTE — Telephone Encounter (Signed)
[employer] was called and they are faxing the approval from 12-12-14 through 02-2016 letter to front fax. Chantel will take care of this once fax has been received.

## 2015-06-15 ENCOUNTER — Other Ambulatory Visit: Payer: Self-pay | Admitting: Internal Medicine

## 2015-06-15 NOTE — Telephone Encounter (Signed)
Last f/u appt 12/2014 

## 2015-06-16 DIAGNOSIS — Z96652 Presence of left artificial knee joint: Secondary | ICD-10-CM | POA: Diagnosis not present

## 2015-06-16 DIAGNOSIS — M25561 Pain in right knee: Secondary | ICD-10-CM | POA: Diagnosis not present

## 2015-06-16 DIAGNOSIS — G8929 Other chronic pain: Secondary | ICD-10-CM | POA: Diagnosis not present

## 2015-06-16 DIAGNOSIS — M5442 Lumbago with sciatica, left side: Secondary | ICD-10-CM | POA: Diagnosis not present

## 2015-06-16 DIAGNOSIS — M1711 Unilateral primary osteoarthritis, right knee: Secondary | ICD-10-CM | POA: Diagnosis not present

## 2015-06-16 DIAGNOSIS — M0579 Rheumatoid arthritis with rheumatoid factor of multiple sites without organ or systems involvement: Secondary | ICD-10-CM | POA: Diagnosis not present

## 2015-06-16 DIAGNOSIS — Z79899 Other long term (current) drug therapy: Secondary | ICD-10-CM | POA: Diagnosis not present

## 2015-06-16 NOTE — Telephone Encounter (Signed)
Approved: okay #[id number] x 0 

## 2015-06-16 NOTE — Telephone Encounter (Signed)
rx called into pharmacy

## 2015-06-17 ENCOUNTER — Other Ambulatory Visit: Payer: Self-pay | Admitting: Internal Medicine

## 2015-06-17 NOTE — Telephone Encounter (Signed)
Message can not be closed at this time; [name] is working with the billing department on behalf of patient. Will forward to [name] to document as she gets updates.

## 2015-06-17 NOTE — Telephone Encounter (Signed)
[name], please advise if this msg can be closed thanks

## 2015-06-19 ENCOUNTER — Ambulatory Visit (INDEPENDENT_AMBULATORY_CARE_PROVIDER_SITE_OTHER): Payer: Medicare PPO

## 2015-06-19 ENCOUNTER — Other Ambulatory Visit: Payer: Self-pay | Admitting: Internal Medicine

## 2015-06-19 DIAGNOSIS — J4540 Moderate persistent asthma, uncomplicated: Secondary | ICD-10-CM

## 2015-06-19 NOTE — Telephone Encounter (Signed)
rx called into pharmacy

## 2015-06-19 NOTE — Telephone Encounter (Signed)
05/25/15 LETVAK PATIENT, Please send back to me for call in

## 2015-06-22 MED ORDER — OMALIZUMAB 150 MG ~~LOC~~ SOLR
375.0000 mg | Freq: Once | SUBCUTANEOUS | Status: AC
  Administered 2015-06-19: 375 mg via SUBCUTANEOUS

## 2015-06-24 ENCOUNTER — Telehealth: Payer: Self-pay | Admitting: Internal Medicine

## 2015-06-24 NOTE — Telephone Encounter (Signed)
Spoke with patient-he is aware that billing dept continues to work on his [place] charges and he will be updated as able.   Also,pt is aware that I am not always able to call back immediatly as I work with MD and other areas in the office but we are diligently working on this matter and will not let this matter slip through the cracks.   Nothing more needed at this time.

## 2015-06-24 NOTE — Telephone Encounter (Signed)
[name] I know you have been helping up front this week, but have you received anything on this? Thanks!

## 2015-06-29 DIAGNOSIS — H1013 Acute atopic conjunctivitis, bilateral: Secondary | ICD-10-CM | POA: Diagnosis not present

## 2015-06-29 DIAGNOSIS — M3501 Sicca syndrome with keratoconjunctivitis: Secondary | ICD-10-CM | POA: Diagnosis not present

## 2015-07-01 NOTE — Telephone Encounter (Signed)
[name] - [name] Has this been done?  Can we close encounter?

## 2015-07-03 NOTE — Telephone Encounter (Signed)
Please give an update. Thanks ladies!

## 2015-07-06 ENCOUNTER — Ambulatory Visit (INDEPENDENT_AMBULATORY_CARE_PROVIDER_SITE_OTHER): Payer: Medicare PPO

## 2015-07-06 DIAGNOSIS — J4540 Moderate persistent asthma, uncomplicated: Secondary | ICD-10-CM | POA: Diagnosis not present

## 2015-07-07 ENCOUNTER — Telehealth: Payer: Self-pay | Admitting: Pulmonary Disease

## 2015-07-07 MED ORDER — OMALIZUMAB 150 MG ~~LOC~~ SOLR
375.0000 mg | Freq: Once | SUBCUTANEOUS | Status: AC
  Administered 2015-07-06: 375 mg via SUBCUTANEOUS

## 2015-07-07 NOTE — Telephone Encounter (Signed)
#   vials:6 Ordered date:07/07/15 Shipping Date:07/08/15

## 2015-07-08 NOTE — Telephone Encounter (Signed)
#   Vials:6 Arrival Date:07/08/15 Lot #:[id number] Exp Date:2/20

## 2015-07-08 NOTE — Telephone Encounter (Signed)
2 different lot #s. The correct one: [id number]

## 2015-07-10 DIAGNOSIS — N319 Neuromuscular dysfunction of bladder, unspecified: Secondary | ICD-10-CM | POA: Diagnosis not present

## 2015-07-10 NOTE — Telephone Encounter (Signed)
Chantel has been working with profee dept about patient. Will need to get updates from Candelaria Arenas. Thanks.

## 2015-07-13 ENCOUNTER — Ambulatory Visit (INDEPENDENT_AMBULATORY_CARE_PROVIDER_SITE_OTHER): Payer: Medicare PPO | Admitting: Internal Medicine

## 2015-07-13 ENCOUNTER — Encounter: Payer: Self-pay | Admitting: Internal Medicine

## 2015-07-13 VITALS — BP 128/60 | HR 69 | Temp 98.0°F | Wt 228.0 lb

## 2015-07-13 DIAGNOSIS — K5909 Other constipation: Secondary | ICD-10-CM | POA: Diagnosis not present

## 2015-07-13 NOTE — Assessment & Plan Note (Addendum)
No worrisome signs but he is still concerned about cancer Discussed that he has no new symptoms Advised to not try to go to after breakfast-- hopefully he will not have to strain as much I don't think he needs colonoscopy

## 2015-07-13 NOTE — Telephone Encounter (Signed)
[name] - has this been done?

## 2015-07-13 NOTE — Progress Notes (Signed)
Pre visit review using our clinic review tool, if applicable. No additional management support is needed unless otherwise documented below in the visit note. 

## 2015-07-13 NOTE — Progress Notes (Signed)
Subjective:    Patient ID: [name], male    DOB: June 05, 1934, 81 y.o.   MRN: [id number]  HPI He is here with several concerns  Is planning to have TKR on right knee soon Dr [name] will do this October 5th  Ongoing bowel problems "It is not normal" Still goes every day --- on miralax daily. Occasionally takes it twice a day He still doesn't get sense of complete emptying He is concerned by the fact that he wipes out some soft stool after he has emptying Uses dulcolax occasionally-- 1 didn't do much, 2 gave him cramps and it was very soft Still has to strain-- takes 20-30 minutes at times  Appetite is fine Has some LLQ abdominal pain--when he is straining No blood in stool  Feels tired all the time Just doesn't feel "good" Not sleeping well--nothing new about this Does use the xanax at night--uses 2 in the AM  Current Outpatient Prescriptions on File Prior to Visit  Medication Sig Dispense Refill  . ALPRAZolam (XANAX) 0.5 MG tablet TAKE 1 TABLET BY MOUTH THREE TIMES DAILY AS NEEDED FOR ANXIETY 90 tablet 0  . aspirin 81 MG tablet Take 81 mg by mouth daily.      . Cholecalciferol (VITAMIN D3) 5000 UNITS CAPS Take 1 capsule by mouth daily.    . Coenzyme Q10 (CO Q 10) 100 MG CAPS Take 1 capsule by mouth daily.    Marland Kitchen FOLIC ACID PO Take 7,209 mg by mouth daily.    . furosemide (LASIX) 20 MG tablet TAKE 1 TABLET BY MOUTH EVERY DAY 30 tablet 11  . gabapentin (NEURONTIN) 300 MG capsule TAKE ONE CAPSULE BY MOUTH THREE TIMES DAILY 90 capsule 3  . hydroxychloroquine (PLAQUENIL) 200 MG tablet Take 200 mg by mouth 2 (two) times daily.     . methotrexate 2.5 MG tablet Take 25 mg by mouth once a week.     . metoprolol succinate (TOPROL-XL) 50 MG 24 hr tablet TAKE 1 TABLET BY MOUTH EVERY DAY 90 tablet 3  . Multiple Vitamin (MULTIVITAMIN) tablet Take 1 tablet by mouth daily.      Marland Kitchen omalizumab (XOLAIR) 150 MG injection Inject into the skin every 14 (fourteen) days.     Marland Kitchen omeprazole  (PRILOSEC) 20 MG capsule TAKE 1 CAPSULE BY MOUTH EVERY DAY 90 capsule 3  . polyethylene glycol (GLYCOLAX) packet Take 34 g by mouth daily.     . pravastatin (PRAVACHOL) 20 MG tablet TAKE 1 TABLET BY MOUTH EVERY DAY 90 tablet 3  . solifenacin (VESICARE) 5 MG tablet Take 5 mg by mouth daily. For bladder spasms     . traMADol (ULTRAM) 50 MG tablet TAKE 1 TABLET BY MOUTH FOUR TIMES DAILY 120 tablet 0  . Turmeric 500 MG CAPS Take 500 mg by mouth daily.     No current facility-administered medications on file prior to visit.    Allergies  Allergen Reactions  . Ciprofloxacin Nausea And Vomiting    Headache  . Citalopram Hydrobromide   . Lorazepam     Adverse reaction  . Paroxetine Nausea Only  . Ramipril   . Simvastatin     Past Medical History  Diagnosis Date  . CAD (coronary artery disease)   . Neurogenic bladder   . Asthma   . Chronic sinusitis   . Diverticulitis   . GERD (gastroesophageal reflux disease)   . Arthritis     rheumatoid  . Cancer     Prostate  .  IBS (irritable bowel syndrome)   . Anxiety   . Neurodermatitis   . ED (erectile dysfunction)   . OSA (obstructive sleep apnea)   . Allergy   . Hypertension   . Depression   . Hx of adenomatous colonic polyps   . History of SIADH   . Hyponatremia     Past Surgical History  Procedure Laterality Date  . Prostate surgery      PROSTATECTOMY  . Knee surgery      ARTHROSCOPY LEFT  . Nasal sinus surgery  2009    DEVIATED SEPTUM AND POLYPS  . Urethral stricture dilatation  02-2010    Dr.Cope  . Total knee arthroplasty Left 9/15    Dr [name]    Family History  Problem Relation Age of Onset  . Heart disease Mother 68    heart failure  . Pneumonia Father 44    pnemonia    Social History   Social History  . Marital Status: Married    Spouse Name: N/A  . Number of Children: 2  . Years of Education: N/A   Occupational History  . Radio station owner     retired   Social History Main Topics  . Smoking  status: Never Smoker   . Smokeless tobacco: Never Used  . Alcohol Use: Yes  . Drug Use: No  . Sexual Activity: Not on file   Other Topics Concern  . Not on file   Social History Narrative   Not sure about a living will or health care POA   Wife should make health care decisions for him   Would accept resuscitation attempts   Not sure about tube feeds   Review of Systems Not losing weight No N/V Takes his furosemide at 5:30AM before getting up (while he still has the big bag on --not the leg bag)    Objective:   Physical Exam  Constitutional: He appears well-developed and well-nourished. No distress.  Neck: Normal range of motion. Neck supple.  Cardiovascular: Normal rate, regular rhythm and normal heart sounds.  Exam reveals no gallop.   No murmur heard. Pulmonary/Chest: Effort normal and breath sounds normal. No respiratory distress. He has no wheezes. He has no rales.  Abdominal: Soft. Bowel sounds are normal. He exhibits no distension. There is no rebound and no guarding.  Slight left side tenderness  Lymphadenopathy:    He has no cervical adenopathy.  Psychiatric:  Very anxious          Assessment & Plan:

## 2015-07-16 NOTE — Telephone Encounter (Signed)
Last thing that I see in the system  06/08/15 PBO received call from [facility] at [place] [facility] regarding outstanding claims for patients Xolair injections. Per media tab [employer] has approved the injection for 12/12/2014-12/28/2014. Placed phone call to Liv at [facility] [phone]. She advises that we send those documents to [facility]. As far as the other outstanding claims 3.18.16 and 3.2.16 those were denied for medical necessity. Will send in what we have for medical documentation. Advised Shantel that the other DOS will need to be called on for a retro auth. If any further issues contact Shantel at [phone]  06/17/15 PBO [id number] DOS 11.24.15 [id number] placed phone call to [facility] and s/w Clair Gulling ref# [id number] who states there is no auth on file. He states that we are within the time frame to obtain a retro auth and that a nurse can obtain that number by calling [phone]. Will contact Shantel at [facility] to advise her of this. Will defer for 60 days PBO [id number] DOS 1.15.16 J2357 [id number] placed phone call to [facility] and s/w Clair Gulling ref# [id number] who states there is an [place] on file. Auth number is [id number]. He states that he will be sending this claim back for review and processing allow 30-45 calendar days for EOP. Will contact Shantel at [facility] and advise her of this. Deferring for 60 days PBO [id number] DOS 2.16.16 J2357 [id number] placed phone call to [facility] and s/w Clair Gulling ref# [id number] who states there is no auth on file. He states that we are within the time frame to obtain a retro auth and that a nurse can obtain that number by calling [phone]. Will contact Shantel at [facility] to advise her of this. Will defer for 60 days   Approval received on 07/25  In [place] for 03/19/15 to 03/19/16  For his xolair  07/08/15 PBO DOS 2.1.16 refaxed medical records for this DOS to the medication intake team at [phone].  Correspondence from [facility] states they are having problems finding the patient. Added DOB, [employer] ID, address and medical records  These are msgs out of the billing system

## 2015-07-16 NOTE — Telephone Encounter (Signed)
Please give an update.

## 2015-07-20 ENCOUNTER — Other Ambulatory Visit: Payer: Self-pay | Admitting: Family Medicine

## 2015-07-20 NOTE — Telephone Encounter (Signed)
Approved: okay #[id number] x 0 

## 2015-07-20 NOTE — Telephone Encounter (Signed)
rx called into pharmacy

## 2015-07-20 NOTE — Telephone Encounter (Signed)
Last office visit 07/13/2015.  Last refilled 06/19/2015 for #90 with no refills.  Ok to refill?

## 2015-07-21 ENCOUNTER — Ambulatory Visit: Payer: Medicare PPO

## 2015-07-21 ENCOUNTER — Ambulatory Visit (INDEPENDENT_AMBULATORY_CARE_PROVIDER_SITE_OTHER): Payer: Medicare PPO

## 2015-07-21 DIAGNOSIS — J4540 Moderate persistent asthma, uncomplicated: Secondary | ICD-10-CM | POA: Diagnosis not present

## 2015-07-22 MED ORDER — OMALIZUMAB 150 MG ~~LOC~~ SOLR
375.0000 mg | Freq: Once | SUBCUTANEOUS | Status: AC
  Administered 2015-07-21: 375 mg via SUBCUTANEOUS

## 2015-08-05 ENCOUNTER — Ambulatory Visit (INDEPENDENT_AMBULATORY_CARE_PROVIDER_SITE_OTHER): Payer: Medicare PPO

## 2015-08-05 DIAGNOSIS — J4540 Moderate persistent asthma, uncomplicated: Secondary | ICD-10-CM | POA: Diagnosis not present

## 2015-08-06 DIAGNOSIS — M25561 Pain in right knee: Secondary | ICD-10-CM | POA: Diagnosis not present

## 2015-08-06 DIAGNOSIS — M0579 Rheumatoid arthritis with rheumatoid factor of multiple sites without organ or systems involvement: Secondary | ICD-10-CM | POA: Diagnosis not present

## 2015-08-06 DIAGNOSIS — M150 Primary generalized (osteo)arthritis: Secondary | ICD-10-CM | POA: Diagnosis not present

## 2015-08-06 DIAGNOSIS — M545 Low back pain: Secondary | ICD-10-CM | POA: Diagnosis not present

## 2015-08-06 MED ORDER — OMALIZUMAB 150 MG ~~LOC~~ SOLR
375.0000 mg | Freq: Once | SUBCUTANEOUS | Status: AC
  Administered 2015-08-05: 375 mg via SUBCUTANEOUS

## 2015-08-07 DIAGNOSIS — N319 Neuromuscular dysfunction of bladder, unspecified: Secondary | ICD-10-CM | POA: Diagnosis not present

## 2015-08-12 ENCOUNTER — Other Ambulatory Visit: Payer: Self-pay | Admitting: Internal Medicine

## 2015-08-13 ENCOUNTER — Telehealth: Payer: Self-pay | Admitting: Pulmonary Disease

## 2015-08-13 NOTE — Telephone Encounter (Signed)
719/2016 

## 2015-08-13 NOTE — Telephone Encounter (Signed)
rx called into pharmacy

## 2015-08-13 NOTE — Telephone Encounter (Signed)
Approved: okay #[id number] x 0 

## 2015-08-14 ENCOUNTER — Other Ambulatory Visit: Payer: Self-pay | Admitting: Internal Medicine

## 2015-08-14 NOTE — Telephone Encounter (Signed)
#   vials:6 Ordered date:08-13-15 Shipping Date:08-14-15

## 2015-08-14 NOTE — Telephone Encounter (Signed)
Approved: okay #[id number] x 0 

## 2015-08-14 NOTE — Telephone Encounter (Signed)
#   Vials:6 Arrival Date:08-14-15 Lot #:[id number] Exp [id number]

## 2015-08-14 NOTE — Telephone Encounter (Signed)
rx called into pharmacy

## 2015-08-14 NOTE — Telephone Encounter (Signed)
07/20/2015 

## 2015-08-19 ENCOUNTER — Other Ambulatory Visit: Payer: Medicare PPO

## 2015-08-20 ENCOUNTER — Ambulatory Visit (INDEPENDENT_AMBULATORY_CARE_PROVIDER_SITE_OTHER): Payer: Medicare PPO | Admitting: Internal Medicine

## 2015-08-20 ENCOUNTER — Encounter: Payer: Self-pay | Admitting: Internal Medicine

## 2015-08-20 VITALS — BP 140/70 | HR 66 | Temp 97.8°F | Ht 71.0 in | Wt 231.0 lb

## 2015-08-20 DIAGNOSIS — Z9889 Other specified postprocedural states: Secondary | ICD-10-CM

## 2015-08-20 DIAGNOSIS — M069 Rheumatoid arthritis, unspecified: Secondary | ICD-10-CM

## 2015-08-20 DIAGNOSIS — Z23 Encounter for immunization: Secondary | ICD-10-CM

## 2015-08-20 DIAGNOSIS — Z7189 Other specified counseling: Secondary | ICD-10-CM

## 2015-08-20 DIAGNOSIS — Z0000 Encounter for general adult medical examination without abnormal findings: Secondary | ICD-10-CM

## 2015-08-20 DIAGNOSIS — F411 Generalized anxiety disorder: Secondary | ICD-10-CM

## 2015-08-20 DIAGNOSIS — Z960 Presence of urogenital implants: Secondary | ICD-10-CM | POA: Insufficient documentation

## 2015-08-20 DIAGNOSIS — I2510 Atherosclerotic heart disease of native coronary artery without angina pectoris: Secondary | ICD-10-CM | POA: Diagnosis not present

## 2015-08-20 DIAGNOSIS — J4540 Moderate persistent asthma, uncomplicated: Secondary | ICD-10-CM

## 2015-08-20 NOTE — Addendum Note (Signed)
Addended by: Despina Hidden on: 08/20/2015 04:02 PM   Modules accepted: Orders

## 2015-08-20 NOTE — Assessment & Plan Note (Signed)
I have personally reviewed the Medicare Annual Wellness questionnaire and have noted 1. The patient's medical and social history 2. Their use of alcohol, tobacco or illicit drugs 3. Their current medications and supplements 4. The patient's functional ability including ADL's, fall risks, home safety risks and hearing or visual             impairment. 5. Diet and physical activities 6. Evidence for depression or mood disorders  The patients weight, height, BMI and visual acuity have been recorded in the chart I have made referrals, counseling and provided education to the patient based review of the above and I have provided the pt with a written personalized care plan for preventive services.  I have provided you with a copy of your personalized plan for preventive services. Please take the time to review along with your updated medication list.  Gets PSA yearly due to past prostate cancer No colon screening due to age Flu vaccine today No zostavax due to MTX rx

## 2015-08-20 NOTE — Assessment & Plan Note (Signed)
See social history  °Gave blank forms °

## 2015-08-20 NOTE — Assessment & Plan Note (Signed)
Remission on xolair

## 2015-08-20 NOTE — Assessment & Plan Note (Signed)
Chronic urinary retention and unable to do self caths

## 2015-08-20 NOTE — Progress Notes (Signed)
Subjective:    Patient ID: [name], male    DOB: 02-16-1934, 81 y.o.   MRN: [id number]  HPI Here for Medicare wellness and follow up of chronic medical conditions Reviewed form and advanced directives Reviewed other doctors Had left TKR last September Enjoys regular alcohol No tobacco  Now planning for right TKR October 5th with Dr [name] No chest pain No palpitations No dizziness or syncope Mild edema --usually gone in the morning Vision is okay--has cataracts but not ready for repair Worsened chronic hearing problems--tested at [facility] and has high freq hearing loss. Considering aides No falls No depression or anhedonia Doesn't do instrumental ADLs --helps to some degree. Needs help getting dressed (socks) No cognitive problems  Asthma quiet No wheezing or cough No SOB  Still with GI problems Cramps regularly Gets rectal urgency-- strains still Will go a little most of those times Still takes the miralax daily  Has constant pain/spasm from Foley botox does help the spasm--but can't get it too close to surgery Cranberry (juice and pills) does help some  RA has been controlled  Continues on the MTX Sees Dr Jefm Bryant  Ongoing anxiety Uses alprazolam at bedtime and again in AM to finish a reasonable sleep Not depressed but worries all the time "I pray for freedom from anxiety"  Current Outpatient Prescriptions on File Prior to Visit  Medication Sig Dispense Refill  . ALPRAZolam (XANAX) 0.5 MG tablet TAKE TABLET BY MOUTH THREE TIMES DAILY AS NEEDED FOR ANXIETY 90 tablet 0  . aspirin 81 MG tablet Take 81 mg by mouth daily.      . Cholecalciferol (VITAMIN D3) 5000 UNITS CAPS Take 1 capsule by mouth daily.    . Coenzyme Q10 (CO Q 10) 100 MG CAPS Take 1 capsule by mouth daily.    Marland Kitchen FOLIC ACID PO Take 3,532 mg by mouth daily.    . furosemide (LASIX) 20 MG tablet TAKE 1 TABLET BY MOUTH EVERY DAY 30 tablet 11  . gabapentin (NEURONTIN) 300 MG capsule TAKE ONE  CAPSULE BY MOUTH THREE TIMES DAILY 90 capsule 3  . hydroxychloroquine (PLAQUENIL) 200 MG tablet Take 200 mg by mouth 2 (two) times daily.     . methotrexate 2.5 MG tablet Take 25 mg by mouth once a week.     . metoprolol succinate (TOPROL-XL) 50 MG 24 hr tablet TAKE 1 TABLET BY MOUTH EVERY DAY 90 tablet 3  . Multiple Vitamin (MULTIVITAMIN) tablet Take 1 tablet by mouth daily.      Marland Kitchen omalizumab (XOLAIR) 150 MG injection Inject into the skin every 14 (fourteen) days.     Marland Kitchen omeprazole (PRILOSEC) 20 MG capsule TAKE 1 CAPSULE BY MOUTH EVERY DAY 90 capsule 3  . polyethylene glycol (GLYCOLAX) packet Take 34 g by mouth daily.     . pravastatin (PRAVACHOL) 20 MG tablet TAKE 1 TABLET BY MOUTH EVERY DAY 90 tablet 3  . solifenacin (VESICARE) 5 MG tablet Take 5 mg by mouth daily. For bladder spasms     . traMADol (ULTRAM) 50 MG tablet TAKE 1 TABLET BY MOUTH FOUR TIMES DAILY 120 tablet 0  . Turmeric 500 MG CAPS Take 500 mg by mouth daily.     No current facility-administered medications on file prior to visit.    Allergies  Allergen Reactions  . Ciprofloxacin Nausea And Vomiting    Headache  . Citalopram Hydrobromide   . Lorazepam     Adverse reaction  . Paroxetine Nausea Only  .  Ramipril   . Simvastatin     Past Medical History  Diagnosis Date  . CAD (coronary artery disease)   . Neurogenic bladder   . Asthma   . Chronic sinusitis   . Diverticulitis   . GERD (gastroesophageal reflux disease)   . Arthritis     rheumatoid  . Cancer     Prostate  . IBS (irritable bowel syndrome)   . Anxiety   . Neurodermatitis   . ED (erectile dysfunction)   . OSA (obstructive sleep apnea)   . Allergy   . Hypertension   . Depression   . Hx of adenomatous colonic polyps   . History of SIADH   . Hyponatremia     Past Surgical History  Procedure Laterality Date  . Prostate surgery      PROSTATECTOMY  . Knee surgery      ARTHROSCOPY LEFT  . Nasal sinus surgery  2009    DEVIATED SEPTUM AND  POLYPS  . Urethral stricture dilatation  02-2010    Dr.Cope  . Total knee arthroplasty Left 9/15    Dr [name]    Family History  Problem Relation Age of Onset  . Heart disease Mother 70    heart failure  . Pneumonia Father 8    pnemonia    Social History   Social History  . Marital Status: Married    Spouse Name: N/A  . Number of Children: 2  . Years of Education: N/A   Occupational History  . Radio station owner     retired   Social History Main Topics  . Smoking status: Never Smoker   . Smokeless tobacco: Never Used  . Alcohol Use: Yes  . Drug Use: No  . Sexual Activity: Not on file   Other Topics Concern  . Not on file   Social History Narrative   Not sure about a living will or health care POA   Wife should make health care decisions for him   Would accept resuscitation attempts   Not sure about tube feeds   Review of Systems Appetite is good Weight is very steady Not sleeping well--awakens "feeling terrible" but then better after drinking 2 glasses of water    Objective:   Physical Exam  Constitutional: He is oriented to person, place, and time. He appears well-developed and well-nourished. No distress.  HENT:  Mouth/Throat: Oropharynx is clear and moist. No oropharyngeal exudate.  Neck: Normal range of motion. Neck supple.  Cardiovascular: Normal rate, regular rhythm and normal heart sounds.  Exam reveals no gallop.   No murmur heard. Pulmonary/Chest: Effort normal. No respiratory distress. He has no wheezes. He has no rales.  Decreased breath sounds but clear Not tight  Abdominal: Soft. He exhibits no distension. There is no tenderness. There is no rebound and no guarding.  Musculoskeletal: He exhibits no edema.  Lymphadenopathy:    He has no cervical adenopathy.  Neurological: He is alert and oriented to person, place, and time.  President-- "Elyn Peers, [place], [place]" [phone] [id number] Recall 3/3  Skin: No rash noted.    Psychiatric: He has a normal mood and affect. His behavior is normal.          Assessment & Plan:

## 2015-08-20 NOTE — Progress Notes (Signed)
Pre visit review using our clinic review tool, if applicable. No additional management support is needed unless otherwise documented below in the visit note. 

## 2015-08-20 NOTE — Assessment & Plan Note (Signed)
No active angina Should be fine for surgery as long as the preop EKG doesn't show any changes

## 2015-08-20 NOTE — Assessment & Plan Note (Signed)
Constant worrier ---especially about his health Doing okay with just the alprazolam

## 2015-08-20 NOTE — Assessment & Plan Note (Signed)
In remission on MTX

## 2015-08-26 ENCOUNTER — Emergency Department
Admission: EM | Admit: 2015-08-26 | Discharge: 2015-08-26 | Disposition: A | Discharge status: Home / Self Care | Payer: Medicare PPO | Attending: Emergency Medicine | Admitting: Emergency Medicine

## 2015-08-26 ENCOUNTER — Encounter: Payer: Self-pay | Admitting: Medical Oncology

## 2015-08-26 ENCOUNTER — Encounter
Admission: RE | Admit: 2015-08-26 | Discharge: 2015-08-26 | Disposition: A | Discharge status: Home / Self Care | Payer: Medicare PPO | Source: Ambulatory Visit | Attending: Orthopedic Surgery | Admitting: Orthopedic Surgery

## 2015-08-26 DIAGNOSIS — I129 Hypertensive chronic kidney disease with stage 1 through stage 4 chronic kidney disease, or unspecified chronic kidney disease: Secondary | ICD-10-CM | POA: Diagnosis not present

## 2015-08-26 DIAGNOSIS — T8389XA Other specified complication of genitourinary prosthetic devices, implants and grafts, initial encounter: Secondary | ICD-10-CM | POA: Diagnosis not present

## 2015-08-26 DIAGNOSIS — Z01818 Encounter for other preprocedural examination: Secondary | ICD-10-CM

## 2015-08-26 DIAGNOSIS — M179 Osteoarthritis of knee, unspecified: Secondary | ICD-10-CM | POA: Insufficient documentation

## 2015-08-26 DIAGNOSIS — Y658 Other specified misadventures during surgical and medical care: Secondary | ICD-10-CM | POA: Diagnosis not present

## 2015-08-26 DIAGNOSIS — Z79899 Other long term (current) drug therapy: Secondary | ICD-10-CM | POA: Insufficient documentation

## 2015-08-26 DIAGNOSIS — Z466 Encounter for fitting and adjustment of urinary device: Secondary | ICD-10-CM | POA: Diagnosis present

## 2015-08-26 DIAGNOSIS — T83021A Displacement of indwelling urethral catheter, initial encounter: Secondary | ICD-10-CM

## 2015-08-26 DIAGNOSIS — Z7982 Long term (current) use of aspirin: Secondary | ICD-10-CM | POA: Diagnosis not present

## 2015-08-26 DIAGNOSIS — N189 Chronic kidney disease, unspecified: Secondary | ICD-10-CM | POA: Insufficient documentation

## 2015-08-26 DIAGNOSIS — T83098A Other mechanical complication of other indwelling urethral catheter, initial encounter: Secondary | ICD-10-CM | POA: Diagnosis not present

## 2015-08-26 HISTORY — DX: Chronic kidney disease, unspecified: N18.9

## 2015-08-26 HISTORY — DX: Anemia, unspecified: D64.9

## 2015-08-26 LAB — BASIC METABOLIC PANEL
ANION GAP: 7 (ref 5–15)
BUN: 17 mg/dL (ref 6–20)
CALCIUM: 8.6 mg/dL — AB (ref 8.9–10.3)
CO2: 29 mmol/L (ref 22–32)
Chloride: 101 mmol/L (ref 101–111)
Creatinine, Ser: 0.6 mg/dL — ABNORMAL LOW (ref 0.61–1.24)
GFR calc Af Amer: >60 mL/min (ref >60)
GLUCOSE: 94 mg/dL (ref 65–99)
Potassium: 4.2 mmol/L (ref 3.5–5.1)
Sodium: 137 mmol/L (ref 135–145)

## 2015-08-26 LAB — CBC
HEMATOCRIT: 37.1 % — AB (ref 40.0–52.0)
Hemoglobin: 12.8 g/dL — ABNORMAL LOW (ref 13.0–18.0)
MCH: 32.1 pg (ref 26.0–34.0)
MCHC: 34.5 g/dL (ref 32.0–36.0)
MCV: 93.1 fL (ref 80.0–100.0)
PLATELETS: 124 10*3/uL — AB (ref 150–440)
RBC: 3.99 MIL/uL — ABNORMAL LOW (ref 4.40–5.90)
RDW: 17 % — AB (ref 11.5–14.5)
WBC: 5.6 10*3/uL (ref 3.8–10.6)

## 2015-08-26 LAB — URINALYSIS COMPLETE WITH MICROSCOPIC (ARMC ONLY)
BILIRUBIN URINE: NEGATIVE
Bacteria, UA: NONE SEEN
Glucose, UA: NEGATIVE mg/dL
Hgb urine dipstick: NEGATIVE
KETONES UR: NEGATIVE mg/dL
LEUKOCYTES UA: NEGATIVE
Nitrite: NEGATIVE
Protein, ur: NEGATIVE mg/dL
SQUAMOUS EPITHELIAL / LPF: NONE SEEN
Specific Gravity, Urine: 1 — ABNORMAL LOW (ref 1.005–1.030)
pH: 6 (ref 5.0–8.0)

## 2015-08-26 LAB — PROTIME-INR
INR: 1.07
Prothrombin Time: 14.1 seconds (ref 11.4–15.0)

## 2015-08-26 LAB — SEDIMENTATION RATE: SED RATE: 29 mm/h — AB (ref 0–20)

## 2015-08-26 LAB — ABO/RH: ABO/RH(D): A POS

## 2015-08-26 LAB — SURGICAL PCR SCREEN
MRSA, PCR: NEGATIVE
Staphylococcus aureus: NEGATIVE

## 2015-08-26 LAB — TYPE AND SCREEN
ABO/RH(D): A POS
Antibody Screen: NEGATIVE

## 2015-08-26 LAB — APTT: aPTT: 28 seconds (ref 24–36)

## 2015-08-26 NOTE — ED Notes (Signed)
Pt reports that he was seen here today for pre-op for knee surgery and the RN who was attempting to obtain a sample deflated his balloon instead of taking sample. Pt has chronic cath x 4 years and is changed every month. Pt denies other complaints except cath needs to be inserted.

## 2015-08-26 NOTE — Patient Instructions (Signed)
  Your procedure is scheduled on: September 02, 2015 (Wednesday) Report to Day Surgery. ([facility])To find out your arrival time please call [phone] between 1PM - 3PM on September 01, 2015 (Tuesday).  Remember: Instructions that are not followed completely may result in serious medical risk, up to and including death, or upon the discretion of your surgeon and anesthesiologist your surgery may need to be rescheduled.    ___x_ 1. Do not eat food or drink liquids after midnight. No gum chewing or hard candies.     ___x2. No Alcohol for 24 hours before or after surgery.   ____ 3. Bring all medications with you on the day of surgery if instructed.    ___x_ 4. Notify your doctor if there is any change in your medical condition     (cold, fever, infections).     Do not wear jewelry, make-up, hairpins, clips or nail polish.  Do not wear lotions, powders, or perfumes. You may wear deodorant.  Do not shave 48 hours prior to surgery. Men may shave face and neck.  Do not bring valuables to the hospital.    [facility] is not responsible for any belongings or valuables.               Contacts, dentures or bridgework may not be worn into surgery.  Leave your suitcase in the car. After surgery it may be brought to your room.  For patients admitted to the hospital, discharge time is determined by your                treatment team.   Patients discharged the day of surgery will not be allowed to drive home.   Please read over the following fact sheets that you were given:   MRSA Information and Surgical Site Infection Prevention   __x__ Take these medicines the morning of surgery with A SIP OF WATER:    1. Metoprolol  2. Gabapentin  3. Pravastatin  4.Omeprazole (Omeprazole at bedtime on October 4)    ____ Fleet Enema (as directed)   __x__ Use CHG Soap as directed  ____ Use inhalers on the day of surgery  ____ Stop metformin 2 days prior to surgery    ____ Take 1/2 of usual insulin dose  the night before surgery and none on the morning of surgery.   __x__ Stop Coumadin/Plavix/aspirin on (Stop Aspirin one week before surgery)  __x_ Stop Anti-inflammatories on (Stop Advil now)   __x__ Stop supplements until after surgery.  (Stop Vitamin B-12, Selenium, Tumeric, Fish Oil, Cranberry, and CO Q10)  ____ Bring C-Pap to the hospital.

## 2015-08-26 NOTE — Discharge Instructions (Signed)
Foley Catheter Care A Foley catheter is a soft, flexible tube. This tube is placed into your bladder to drain pee (urine). If you go home with this catheter in place, follow the instructions below. TAKING CARE OF THE CATHETER 1. Wash your hands with soap and water. 2. Put soap and water on a clean washcloth.  Clean the skin where the tube goes into your body.  Clean away from the tube site.  Never wipe toward the tube.  Clean the area using a circular motion.  Remove all the soap. Pat the area dry with a clean towel. For males, reposition the skin that covers the end of the penis (foreskin). 3. Attach the tube to your leg with tape or a leg strap. Do not stretch the tube tight. If you are using tape, remove any stickiness left behind by past tape you used. 4. Keep the drainage bag below your hips. Keep it off the floor. 5. Check your tube during the day. Make sure it is working and draining. Make sure the tube does not curl, twist, or bend. 6. Do not pull on the tube or try to take it out. TAKING CARE OF THE DRAINAGE BAGS You will have a large overnight drainage bag and a small leg bag. You may wear the overnight bag any time. Never wear the small bag at night. Follow the directions below. Emptying the Drainage Bag Empty your drainage bag when it is  - full or at least 2-3 times a day. 1. Wash your hands with soap and water. 2. Keep the drainage bag below your hips. 3. Hold the dirty bag over the toilet or clean container. 4. Open the pour spout at the bottom of the bag. Empty the pee into the toilet or container. Do not let the pour spout touch anything. 5. Clean the pour spout with a gauze pad or cotton ball that has rubbing alcohol on it. 6. Close the pour spout. 7. Attach the bag to your leg with tape or a leg strap. 8. Wash your hands well. Changing the Drainage Bag Change your bag once a month or sooner if it starts to smell or look dirty.  1. Wash your hands with soap and  water. 2. Pinch the rubber tube so that pee does not spill out. 3. Disconnect the catheter tube from the drainage tube at the connection valve. Do not let the tubes touch anything. 4. Clean the end of the catheter tube with an alcohol wipe. Clean the end of a the drainage tube with a different alcohol wipe. 5. Connect the catheter tube to the drainage tube of the clean drainage bag. 6. Attach the new bag to the leg with tape or a leg strap. Avoid attaching the new bag too tightly. 7. Wash your hands well. Cleaning the Drainage Bag 1. Wash your hands with soap and water. 2. Wash the bag in warm, soapy water. 3. Rinse the bag with warm water. 4. Fill the bag with a mixture of white vinegar and water (1 cup vinegar to 1 quart warm water [.2 liter vinegar to 1 liter warm water]). Close the bag and soak it for 30 minutes in the solution. 5. Rinse the bag with warm water. 6. Hang the bag to dry with the pour spout open and hanging downward. 7. Store the clean bag (once it is dry) in a clean plastic bag. 8. Wash your hands well. PREVENT INFECTION  Wash your hands before and after touching your tube.  Take showers every day. Wash the skin where the tube enters your body. Do not take baths. Replace wet leg straps with dry ones, if this applies.  Do not use powders, sprays, or lotions on the genital area. Only use creams, lotions, or ointments as told by your doctor.  For females, wipe from front to back after going to the bathroom.  Drink enough fluids to keep your pee clear or pale yellow unless you are told not to have too much fluid (fluid restriction).  Do not let the drainage bag or tubing touch or lie on the floor.  Wear cotton underwear to keep the area dry. GET HELP IF:  Your pee is cloudy or smells unusually bad.  Your tube becomes clogged.  You are not draining pee into the bag or your bladder feels full.  Your tube starts to leak. GET HELP RIGHT AWAY IF:  You have pain,  puffiness (swelling), redness, or yellowish-white fluid (pus) where the tube enters the body.  You have pain in the belly (abdomen), legs, lower back, or bladder.  You have a fever.  You see blood fill the tube, or your pee is pink or red.  You feel sick to your stomach (nauseous), throw up (vomit), or have chills.  Your tube gets pulled out. MAKE SURE YOU:   Understand these instructions.  Will watch your condition.  Will get help right away if you are not doing well or get worse. Document Released: 03/11/2013 Document Revised: 03/31/2014 Document Reviewed: 03/11/2013 [facility] Patient Information 2015 [place], [place]. This information is not intended to replace advice given to you by your health care provider. Make sure you discuss any questions you have with your health care provider.

## 2015-08-26 NOTE — ED Notes (Signed)
Foley inserted and pt tolerated procedure well

## 2015-08-26 NOTE — ED Provider Notes (Signed)
[facility] Emergency Department Provider Note  ____________________________________________  Time seen: Approximately 7:01 PM  I have reviewed the triage vital signs and the nursing notes.   HISTORY  Chief Complaint Other   HPI [name] is a 81 y.o. male resents for evaluation of Foley insertion. Patient states he came in for preop had his doctors and they accidentally deflated this catheter insisted obtaining the urine. Patient brought his own catheter for re-insertion. Denies any other complaints at this time.  Past Medical History  Diagnosis Date  . CAD (coronary artery disease)   . Neurogenic bladder   . Asthma   . Chronic sinusitis   . Diverticulitis   . GERD (gastroesophageal reflux disease)   . Arthritis     rheumatoid  . Cancer     Prostate  . IBS (irritable bowel syndrome)   . Anxiety   . Neurodermatitis   . ED (erectile dysfunction)   . OSA (obstructive sleep apnea)   . Allergy   . Hypertension   . Depression   . Hx of adenomatous colonic polyps   . History of SIADH   . Hyponatremia   . Chronic kidney disease     permnaent foley catheter  . Anemia     Patient Active Problem List   Diagnosis Date Noted  . Presence of indwelling urinary catheter 08/20/2015  . Advanced directives, counseling/discussion 07/28/2014  . Preoperative examination 07/28/2014  . Routine general medical examination at a health care facility 04/09/2012  . History of prostate cancer 04/09/2012  . Venous stasis dermatitis 06/13/2011  . Chronic sinusitis   . CONSTIPATION, CHRONIC 12/07/2010  . Hyperlipemia 12/02/2010  . OSTEOARTHRITIS 12/02/2010  . NEUROGENIC BLADDER 05/24/2010  . IBS 05/05/2010  . PERSONAL HISTORY OF COLONIC POLYPS 05/05/2010  . NEUROPATHY 05/03/2010  . Essential hypertension, benign 03/25/2009  . GAD (generalized anxiety disorder) 01/27/2007  . Coronary atherosclerosis of native coronary artery 01/27/2007  . Allergic asthma  01/27/2007  . GERD 01/27/2007  . ERECTILE DYSFUNCTION, ORGANIC 01/27/2007  . Chronic rheumatic arthritis 01/27/2007    Past Surgical History  Procedure Laterality Date  . Prostate surgery      PROSTATECTOMY  . Knee surgery      ARTHROSCOPY LEFT  . Nasal sinus surgery  2009    DEVIATED SEPTUM AND POLYPS  . Urethral stricture dilatation  02-2010    Dr.Cope  . Total knee arthroplasty Left 9/15    Dr [name]  . Joint replacement      Current Outpatient Rx  Name  Route  Sig  Dispense  Refill  . ALPRAZolam (XANAX) 0.5 MG tablet      TAKE TABLET BY MOUTH THREE TIMES DAILY AS NEEDED FOR ANXIETY   90 tablet   0   . aspirin 81 MG tablet   Oral   Take 81 mg by mouth daily.           . Cholecalciferol (VITAMIN D3) 5000 UNITS CAPS   Oral   Take 1 capsule by mouth daily.         . Coenzyme Q10 (CO Q 10) 100 MG CAPS   Oral   Take 1 capsule by mouth daily.         . Cranberry 425 MG CAPS   Oral   Take 2 capsules by mouth daily.         . fluticasone (FLONASE) 50 MCG/ACT nasal spray   Each Nare   Place 2 sprays into both nostrils 2 (  two) times daily.         Marland Kitchen FOLIC ACID PO   Oral   Take 400 mcg by mouth daily.          . furosemide (LASIX) 20 MG tablet   Oral   Take 20 mg by mouth at bedtime.         . gabapentin (NEURONTIN) 300 MG capsule      TAKE ONE CAPSULE BY MOUTH THREE TIMES DAILY   90 capsule   3   . hydroxychloroquine (PLAQUENIL) 200 MG tablet   Oral   Take 200 mg by mouth 2 (two) times daily.          . methotrexate 2.5 MG tablet   Oral   Take 25 mg by mouth once a week. (Tuesday)         . metoprolol succinate (TOPROL-XL) 50 MG 24 hr tablet      TAKE 1 TABLET BY MOUTH EVERY DAY   90 tablet   3   . Milk Thistle 250 MG CAPS   Oral   Take 1 capsule by mouth daily.         . Multiple Vitamin (MULTIVITAMIN) tablet   Oral   Take 1 tablet by mouth daily.           Marland Kitchen omalizumab (XOLAIR) 150 MG injection   Subcutaneous    Inject into the skin every 14 (fourteen) days.          . Omega-3 Fatty Acids (FISH OIL) 1200 MG CAPS   Oral   Take 2 capsules by mouth daily.         Marland Kitchen omeprazole (PRILOSEC) 20 MG capsule      TAKE 1 CAPSULE BY MOUTH EVERY DAY   90 capsule   3   . polyethylene glycol (GLYCOLAX) packet   Oral   Take 34 g by mouth daily.          . pravastatin (PRAVACHOL) 20 MG tablet      TAKE 1 TABLET BY MOUTH EVERY DAY   90 tablet   3   . Probiotic Product (ALIGN) 4 MG CAPS   Oral   Take 1 capsule by mouth 2 (two) times daily.         . Selenium 200 MCG CAPS   Oral   Take 1 capsule by mouth daily.         . solifenacin (VESICARE) 5 MG tablet   Oral   Take 5 mg by mouth at bedtime. For bladder spasms         . traMADol (ULTRAM) 50 MG tablet      TAKE 1 TABLET BY MOUTH FOUR TIMES DAILY   120 tablet   0   . Turmeric 500 MG CAPS   Oral   Take 500 mg by mouth daily.         . vitamin B-12 (CYANOCOBALAMIN) 500 MCG tablet   Oral   Take 500 mcg by mouth daily.           Allergies Ciprofloxacin; Citalopram hydrobromide; Lorazepam; Paroxetine; Ramipril; and Simvastatin  Family History  Problem Relation Age of Onset  . Heart disease Mother 57    heart failure  . Pneumonia Father 58    pnemonia    Social History Social History  Substance Use Topics  . Smoking status: Never Smoker   . Smokeless tobacco: Never Used  . Alcohol Use: Yes     Comment: 2-3 shot of gin  daily    Review of Systems Constitutional: No fever/chills Eyes: No visual changes. ENT: No sore throat. Cardiovascular: Denies chest pain. Respiratory: Denies shortness of breath. Gastrointestinal: No abdominal pain.  No nausea, no vomiting.  No diarrhea.  No constipation. Genitourinary: Negative for dysuria. Musculoskeletal: Negative for back pain. Skin: Negative for rash. Neurological: Negative for headaches, focal weakness or numbness.  10-point ROS otherwise  negative.  ____________________________________________   PHYSICAL EXAM:  VITAL SIGNS: ED Triage Vitals  Enc Vitals Group     BP 08/26/15 1838 133/65 mmHg     Pulse Rate 08/26/15 1838 72     Resp 08/26/15 1838 18     Temp 08/26/15 1838 97.5 F (36.4 C)     Temp Source 08/26/15 1838 Oral     SpO2 08/26/15 1838 94 %     Weight 08/26/15 1838 230 lb (104.327 kg)     Height --      Head Cir --      Peak Flow --      Pain Score --      Pain Loc --      Pain Edu? --      Excl. in [place]? --     Constitutional: Alert and oriented. Well appearing and in no acute distress. Eyes: Conjunctivae are normal. PERRL. EOMI. Head: Atraumatic. Nose: No congestion/rhinnorhea. Mouth/Throat: Mucous membranes are moist.  Oropharynx non-erythematous. Neck: No stridor.   Cardiovascular: Normal rate, regular rhythm. Grossly normal heart sounds.  Good peripheral circulation. Respiratory: Normal respiratory effort.  No retractions. Lungs CTAB. Gastrointestinal: Soft and nontender. No distention. No abdominal bruits. No CVA tenderness. Genitourinary: Positive for back intact on  leg but catheter not intact in penis. Musculoskeletal: No lower extremity tenderness nor edema.  No joint effusions. Neurologic:  Normal speech and language. No gross focal neurologic deficits are appreciated. No gait instability. Skin:  Skin is warm, dry and intact. No rash noted. Psychiatric: Mood and affect are normal. Speech and behavior are normal.  ____________________________________________   LABS (all labs ordered are listed, but only abnormal results are displayed)  Labs Reviewed - No data to display ____________________________________________   PROCEDURES  Procedure(s) performed: None  Critical Care performed: No  ____________________________________________   INITIAL IMPRESSION / ASSESSMENT AND PLAN / ED COURSE  Pertinent labs & imaging results that were available during my care of the patient were  reviewed by me and considered in my medical decision making (see chart for details).  Foley catheter reinserted. Patient to follow-up as needed ____________________________________________   FINAL CLINICAL IMPRESSION(S) / ED DIAGNOSES  Final diagnoses:  Dislodged Foley catheter      Arlyss Repress, PA-C 08/26/15 1940  Delman Kitten, MD 09/04/15 [phone]

## 2015-08-28 DIAGNOSIS — N319 Neuromuscular dysfunction of bladder, unspecified: Secondary | ICD-10-CM | POA: Diagnosis not present

## 2015-08-28 DIAGNOSIS — Z01818 Encounter for other preprocedural examination: Secondary | ICD-10-CM | POA: Diagnosis not present

## 2015-08-28 LAB — URINE CULTURE: Culture: NO GROWTH

## 2015-08-31 ENCOUNTER — Ambulatory Visit (INDEPENDENT_AMBULATORY_CARE_PROVIDER_SITE_OTHER): Payer: Medicare PPO

## 2015-08-31 ENCOUNTER — Ambulatory Visit: Payer: Medicare PPO

## 2015-08-31 DIAGNOSIS — J4520 Mild intermittent asthma, uncomplicated: Secondary | ICD-10-CM | POA: Diagnosis not present

## 2015-08-31 MED ORDER — OMALIZUMAB 150 MG ~~LOC~~ SOLR
375.0000 mg | Freq: Once | SUBCUTANEOUS | Status: AC
  Administered 2015-08-31: 375 mg via SUBCUTANEOUS

## 2015-09-02 ENCOUNTER — Inpatient Hospital Stay: Payer: Medicare PPO

## 2015-09-02 ENCOUNTER — Inpatient Hospital Stay
Admission: RE | Admit: 2015-09-02 | Discharge: 2015-09-05 | DRG: 470 | Disposition: A | Discharge status: Home Health | Payer: Medicare PPO | Source: Ambulatory Visit | Attending: Orthopedic Surgery | Admitting: Orthopedic Surgery

## 2015-09-02 ENCOUNTER — Encounter: Payer: Self-pay | Admitting: Orthopedic Surgery

## 2015-09-02 ENCOUNTER — Inpatient Hospital Stay: Payer: Medicare PPO | Admitting: Certified Registered Nurse Anesthetist

## 2015-09-02 ENCOUNTER — Encounter
Admission: RE | Disposition: A | Discharge status: Home Health | Payer: Self-pay | Source: Ambulatory Visit | Attending: Orthopedic Surgery

## 2015-09-02 DIAGNOSIS — Z471 Aftercare following joint replacement surgery: Secondary | ICD-10-CM | POA: Diagnosis not present

## 2015-09-02 DIAGNOSIS — N189 Chronic kidney disease, unspecified: Secondary | ICD-10-CM | POA: Diagnosis present

## 2015-09-02 DIAGNOSIS — Z8546 Personal history of malignant neoplasm of prostate: Secondary | ICD-10-CM

## 2015-09-02 DIAGNOSIS — M179 Osteoarthritis of knee, unspecified: Secondary | ICD-10-CM | POA: Diagnosis not present

## 2015-09-02 DIAGNOSIS — J449 Chronic obstructive pulmonary disease, unspecified: Secondary | ICD-10-CM | POA: Diagnosis not present

## 2015-09-02 DIAGNOSIS — M069 Rheumatoid arthritis, unspecified: Secondary | ICD-10-CM | POA: Diagnosis present

## 2015-09-02 DIAGNOSIS — Z79899 Other long term (current) drug therapy: Secondary | ICD-10-CM | POA: Diagnosis not present

## 2015-09-02 DIAGNOSIS — Z7984 Long term (current) use of oral hypoglycemic drugs: Secondary | ICD-10-CM

## 2015-09-02 DIAGNOSIS — Z888 Allergy status to other drugs, medicaments and biological substances status: Secondary | ICD-10-CM

## 2015-09-02 DIAGNOSIS — N319 Neuromuscular dysfunction of bladder, unspecified: Secondary | ICD-10-CM | POA: Diagnosis present

## 2015-09-02 DIAGNOSIS — I2510 Atherosclerotic heart disease of native coronary artery without angina pectoris: Secondary | ICD-10-CM | POA: Diagnosis present

## 2015-09-02 DIAGNOSIS — M1711 Unilateral primary osteoarthritis, right knee: Secondary | ICD-10-CM | POA: Diagnosis not present

## 2015-09-02 DIAGNOSIS — F419 Anxiety disorder, unspecified: Secondary | ICD-10-CM | POA: Diagnosis not present

## 2015-09-02 DIAGNOSIS — J329 Chronic sinusitis, unspecified: Secondary | ICD-10-CM | POA: Diagnosis present

## 2015-09-02 DIAGNOSIS — M6281 Muscle weakness (generalized): Secondary | ICD-10-CM | POA: Diagnosis not present

## 2015-09-02 DIAGNOSIS — Y732 Prosthetic and other implants, materials and accessory gastroenterology and urology devices associated with adverse incidents: Secondary | ICD-10-CM | POA: Diagnosis present

## 2015-09-02 DIAGNOSIS — K589 Irritable bowel syndrome without diarrhea: Secondary | ICD-10-CM | POA: Diagnosis present

## 2015-09-02 DIAGNOSIS — Z96651 Presence of right artificial knee joint: Secondary | ICD-10-CM | POA: Diagnosis not present

## 2015-09-02 DIAGNOSIS — T83031A Leakage of indwelling urethral catheter, initial encounter: Secondary | ICD-10-CM | POA: Diagnosis present

## 2015-09-02 DIAGNOSIS — Z7982 Long term (current) use of aspirin: Secondary | ICD-10-CM | POA: Diagnosis not present

## 2015-09-02 DIAGNOSIS — K219 Gastro-esophageal reflux disease without esophagitis: Secondary | ICD-10-CM | POA: Diagnosis not present

## 2015-09-02 DIAGNOSIS — I129 Hypertensive chronic kidney disease with stage 1 through stage 4 chronic kidney disease, or unspecified chronic kidney disease: Secondary | ICD-10-CM | POA: Diagnosis present

## 2015-09-02 DIAGNOSIS — F329 Major depressive disorder, single episode, unspecified: Secondary | ICD-10-CM | POA: Diagnosis not present

## 2015-09-02 DIAGNOSIS — Z96659 Presence of unspecified artificial knee joint: Secondary | ICD-10-CM

## 2015-09-02 DIAGNOSIS — J45909 Unspecified asthma, uncomplicated: Secondary | ICD-10-CM | POA: Diagnosis present

## 2015-09-02 DIAGNOSIS — E785 Hyperlipidemia, unspecified: Secondary | ICD-10-CM | POA: Diagnosis present

## 2015-09-02 DIAGNOSIS — D649 Anemia, unspecified: Secondary | ICD-10-CM | POA: Diagnosis not present

## 2015-09-02 DIAGNOSIS — G4733 Obstructive sleep apnea (adult) (pediatric): Secondary | ICD-10-CM | POA: Diagnosis present

## 2015-09-02 HISTORY — PX: KNEE ARTHROPLASTY: SHX992

## 2015-09-02 SURGERY — ARTHROPLASTY, KNEE, TOTAL, USING IMAGELESS COMPUTER-ASSISTED NAVIGATION
Anesthesia: Spinal | Site: Knee | Laterality: Right | Wound class: Clean

## 2015-09-02 MED ORDER — BISACODYL 10 MG RE SUPP: 10.0000 mg | Freq: Every day | RECTAL | Status: DC | PRN

## 2015-09-02 MED ORDER — METOCLOPRAMIDE HCL 10 MG PO TABS
10.0000 mg | ORAL_TABLET | Freq: Three times a day (TID) | ORAL | Status: AC
  Administered 2015-09-02 – 2015-09-04 (×7): 10 mg via ORAL
  Filled 2015-09-02 (×6): qty 1

## 2015-09-02 MED ORDER — FOLIC ACID 1 MG PO TABS
500.0000 ug | ORAL_TABLET | Freq: Every day | ORAL | Status: DC
  Administered 2015-09-02 – 2015-09-05 (×4): 0.5 mg via ORAL
  Filled 2015-09-02 (×4): qty 1

## 2015-09-02 MED ORDER — ACETAMINOPHEN 10 MG/ML IV SOLN
1000.0000 mg | Freq: Four times a day (QID) | INTRAVENOUS | Status: AC
  Administered 2015-09-02 – 2015-09-03 (×4): 1000 mg via INTRAVENOUS
  Filled 2015-09-02 (×4): qty 100

## 2015-09-02 MED ORDER — TRAMADOL HCL 50 MG PO TABS
50.0000 mg | ORAL_TABLET | ORAL | Status: DC | PRN
  Administered 2015-09-03 (×2): 100 mg via ORAL
  Administered 2015-09-03 (×2): 50 mg via ORAL
  Administered 2015-09-03 – 2015-09-05 (×2): 100 mg via ORAL
  Filled 2015-09-02 (×3): qty 2
  Filled 2015-09-02 (×2): qty 1
  Filled 2015-09-02: qty 2

## 2015-09-02 MED ORDER — FENTANYL CITRATE (PF) 100 MCG/2ML IJ SOLN
25.0000 ug | INTRAMUSCULAR | Status: DC | PRN
  Administered 2015-09-02 (×5): 25 ug via INTRAVENOUS

## 2015-09-02 MED ORDER — NEOMYCIN-POLYMYXIN B GU 40-200000 IR SOLN
Status: AC
  Filled 2015-09-02: qty 20

## 2015-09-02 MED ORDER — CEFAZOLIN SODIUM-DEXTROSE 2-3 GM-% IV SOLR
INTRAVENOUS | Status: AC
  Filled 2015-09-02: qty 50

## 2015-09-02 MED ORDER — GLYCOPYRROLATE 0.2 MG/ML IJ SOLN
INTRAMUSCULAR | Status: AC
  Administered 2015-09-02: 0.1 mg via INTRAVENOUS
  Filled 2015-09-02: qty 1

## 2015-09-02 MED ORDER — ACETAMINOPHEN 10 MG/ML IV SOLN
INTRAVENOUS | Status: AC
  Filled 2015-09-02: qty 100

## 2015-09-02 MED ORDER — RISAQUAD PO CAPS
1.0000 | ORAL_CAPSULE | Freq: Two times a day (BID) | ORAL | Status: DC
  Administered 2015-09-02 – 2015-09-05 (×6): 1 via ORAL
  Filled 2015-09-02 (×6): qty 1

## 2015-09-02 MED ORDER — SODIUM CHLORIDE 0.9 % IV SOLN
Freq: Once | INTRAVENOUS | Status: AC
Start: 2015-09-02 — End: 2015-09-02
  Administered 2015-09-02: 18:00:00 via INTRAVENOUS

## 2015-09-02 MED ORDER — ACETAMINOPHEN 325 MG PO TABS: 650.0000 mg | ORAL_TABLET | Freq: Four times a day (QID) | ORAL | Status: DC | PRN

## 2015-09-02 MED ORDER — ALPRAZOLAM 0.5 MG PO TABS
0.5000 mg | ORAL_TABLET | Freq: Three times a day (TID) | ORAL | Status: DC | PRN
Start: 2015-09-02 — End: 2015-09-05
  Administered 2015-09-02 – 2015-09-05 (×3): 0.5 mg via ORAL
  Filled 2015-09-02 (×3): qty 1

## 2015-09-02 MED ORDER — CYANOCOBALAMIN 500 MCG PO TABS
500.0000 ug | ORAL_TABLET | Freq: Every day | ORAL | Status: DC
  Administered 2015-09-03 – 2015-09-05 (×3): 500 ug via ORAL
  Filled 2015-09-02 (×3): qty 1

## 2015-09-02 MED ORDER — MORPHINE SULFATE (PF) 2 MG/ML IV SOLN
2.0000 mg | INTRAVENOUS | Status: DC | PRN
  Administered 2015-09-02 – 2015-09-03 (×5): 2 mg via INTRAVENOUS
  Filled 2015-09-02 (×5): qty 1

## 2015-09-02 MED ORDER — NEOMYCIN-POLYMYXIN B GU 40-200000 IR SOLN
Status: DC | PRN
Start: 2015-09-02 — End: 2015-09-02
  Administered 2015-09-02: 14 mL

## 2015-09-02 MED ORDER — GABAPENTIN 300 MG PO CAPS
300.0000 mg | ORAL_CAPSULE | Freq: Three times a day (TID) | ORAL | Status: DC
  Administered 2015-09-02 – 2015-09-05 (×7): 300 mg via ORAL
  Filled 2015-09-02 (×7): qty 1

## 2015-09-02 MED ORDER — PANTOPRAZOLE SODIUM 40 MG PO TBEC
40.0000 mg | DELAYED_RELEASE_TABLET | Freq: Two times a day (BID) | ORAL | Status: DC
  Administered 2015-09-02 – 2015-09-05 (×6): 40 mg via ORAL
  Filled 2015-09-02 (×6): qty 1

## 2015-09-02 MED ORDER — FLEET ENEMA 7-19 GM/118ML RE ENEM: 1.0000 | ENEMA | Freq: Once | RECTAL | Status: DC | PRN

## 2015-09-02 MED ORDER — SODIUM CHLORIDE 0.9 % IJ SOLN
INTRAMUSCULAR | Status: AC
Start: 2015-09-02 — End: 2015-09-02
  Filled 2015-09-02: qty 50

## 2015-09-02 MED ORDER — MAGNESIUM HYDROXIDE 400 MG/5ML PO SUSP
30.0000 mL | Freq: Every day | ORAL | Status: DC | PRN
  Administered 2015-09-03 – 2015-09-04 (×2): 30 mL via ORAL
  Filled 2015-09-02 (×2): qty 30

## 2015-09-02 MED ORDER — VITAMIN D 1000 UNITS PO TABS
5000.0000 [IU] | ORAL_TABLET | Freq: Every day | ORAL | Status: DC
  Administered 2015-09-03 – 2015-09-04 (×2): 5000 [IU] via ORAL
  Filled 2015-09-02 (×3): qty 5

## 2015-09-02 MED ORDER — PRAVASTATIN SODIUM 20 MG PO TABS
20.0000 mg | ORAL_TABLET | Freq: Every day | ORAL | Status: DC
Start: 2015-09-02 — End: 2015-09-05
  Administered 2015-09-03 – 2015-09-05 (×3): 20 mg via ORAL
  Filled 2015-09-02 (×3): qty 1

## 2015-09-02 MED ORDER — BUPIVACAINE HCL (PF) 0.5 % IJ SOLN
INTRAMUSCULAR | Status: DC | PRN
  Administered 2015-09-02: 3 mL

## 2015-09-02 MED ORDER — ALUM & MAG HYDROXIDE-SIMETH 200-200-20 MG/5ML PO SUSP: 30.0000 mL | ORAL | Status: DC | PRN

## 2015-09-02 MED ORDER — DIPHENHYDRAMINE HCL 12.5 MG/5ML PO ELIX: 12.5000 mg | ORAL_SOLUTION | ORAL | Status: DC | PRN

## 2015-09-02 MED ORDER — METOPROLOL SUCCINATE ER 50 MG PO TB24
50.0000 mg | ORAL_TABLET | Freq: Every day | ORAL | Status: DC
  Administered 2015-09-04 – 2015-09-05 (×2): 50 mg via ORAL
  Filled 2015-09-02 (×3): qty 1

## 2015-09-02 MED ORDER — SODIUM CHLORIDE 0.9 % IJ SOLN
INTRAMUSCULAR | Status: AC
  Filled 2015-09-02: qty 3

## 2015-09-02 MED ORDER — ADULT MULTIVITAMIN W/MINERALS CH
1.0000 | ORAL_TABLET | Freq: Every day | ORAL | Status: DC
  Administered 2015-09-03 – 2015-09-05 (×3): 1 via ORAL
  Filled 2015-09-02 (×5): qty 1

## 2015-09-02 MED ORDER — GLYCOPYRROLATE 0.2 MG/ML IJ SOLN
0.1000 mg | Freq: Once | INTRAMUSCULAR | Status: AC
  Administered 2015-09-02: 0.1 mg via INTRAVENOUS

## 2015-09-02 MED ORDER — OMEGA-3-ACID ETHYL ESTERS 1 G PO CAPS
1.0000 g | ORAL_CAPSULE | Freq: Every day | ORAL | Status: DC
  Administered 2015-09-03 – 2015-09-04 (×2): 1 g via ORAL
  Filled 2015-09-02 (×3): qty 1

## 2015-09-02 MED ORDER — PHENOL 1.4 % MT LIQD: 1.0000 | OROMUCOSAL | Status: DC | PRN

## 2015-09-02 MED ORDER — FENTANYL CITRATE (PF) 100 MCG/2ML IJ SOLN
INTRAMUSCULAR | Status: AC
  Administered 2015-09-02: 25 ug via INTRAVENOUS
  Filled 2015-09-02: qty 2

## 2015-09-02 MED ORDER — PROPOFOL 500 MG/50ML IV EMUL
INTRAVENOUS | Status: DC | PRN
  Administered 2015-09-02: 50 ug/kg/min via INTRAVENOUS

## 2015-09-02 MED ORDER — FERROUS SULFATE 325 (65 FE) MG PO TABS
325.0000 mg | ORAL_TABLET | Freq: Two times a day (BID) | ORAL | Status: DC
  Administered 2015-09-02 – 2015-09-05 (×6): 325 mg via ORAL
  Filled 2015-09-02 (×6): qty 1

## 2015-09-02 MED ORDER — ENOXAPARIN SODIUM 30 MG/0.3ML ~~LOC~~ SOLN
30.0000 mg | Freq: Two times a day (BID) | SUBCUTANEOUS | Status: DC
  Administered 2015-09-03 – 2015-09-05 (×5): 30 mg via SUBCUTANEOUS
  Filled 2015-09-02 (×5): qty 0.3

## 2015-09-02 MED ORDER — FENTANYL CITRATE (PF) 100 MCG/2ML IJ SOLN
INTRAMUSCULAR | Status: DC | PRN
  Administered 2015-09-02 (×2): 50 ug via INTRAVENOUS

## 2015-09-02 MED ORDER — BUPIVACAINE-EPINEPHRINE (PF) 0.25% -1:200000 IJ SOLN
INTRAMUSCULAR | Status: AC
  Filled 2015-09-02: qty 30

## 2015-09-02 MED ORDER — FENTANYL CITRATE (PF) 100 MCG/2ML IJ SOLN
INTRAMUSCULAR | Status: AC
  Filled 2015-09-02: qty 2

## 2015-09-02 MED ORDER — BUPIVACAINE-EPINEPHRINE 0.25% -1:200000 IJ SOLN
INTRAMUSCULAR | Status: DC | PRN
  Administered 2015-09-02: 30 mL

## 2015-09-02 MED ORDER — ACETAMINOPHEN 10 MG/ML IV SOLN
INTRAVENOUS | Status: DC | PRN
  Administered 2015-09-02: 1000 mg via INTRAVENOUS

## 2015-09-02 MED ORDER — SODIUM CHLORIDE 0.9 % IV SOLN
INTRAVENOUS | Status: DC
Start: 2015-09-02 — End: 2015-09-05
  Administered 2015-09-02 – 2015-09-03 (×2): via INTRAVENOUS

## 2015-09-02 MED ORDER — SODIUM CHLORIDE 0.9 % IV SOLN
INTRAVENOUS | Status: DC | PRN
  Administered 2015-09-02: 60 mL

## 2015-09-02 MED ORDER — CEFAZOLIN SODIUM-DEXTROSE 2-3 GM-% IV SOLR
2.0000 g | Freq: Once | INTRAVENOUS | Status: AC
  Administered 2015-09-02: 2 g via INTRAVENOUS

## 2015-09-02 MED ORDER — ONDANSETRON HCL 4 MG/2ML IJ SOLN: 4.0000 mg | Freq: Four times a day (QID) | INTRAMUSCULAR | Status: DC | PRN

## 2015-09-02 MED ORDER — LACTATED RINGERS IV SOLN
INTRAVENOUS | Status: DC
  Administered 2015-09-02 (×2): via INTRAVENOUS

## 2015-09-02 MED ORDER — FUROSEMIDE 20 MG PO TABS
20.0000 mg | ORAL_TABLET | Freq: Every day | ORAL | Status: DC
  Administered 2015-09-02 – 2015-09-04 (×3): 20 mg via ORAL
  Filled 2015-09-02 (×4): qty 1

## 2015-09-02 MED ORDER — SELENIUM 200 MCG PO CAPS: 1.0000 | ORAL_CAPSULE | Freq: Every day | ORAL | Status: DC

## 2015-09-02 MED ORDER — OXYCODONE HCL 5 MG PO TABS
5.0000 mg | ORAL_TABLET | ORAL | Status: DC | PRN
  Administered 2015-09-02 (×2): 5 mg via ORAL
  Administered 2015-09-03 – 2015-09-04 (×10): 10 mg via ORAL
  Administered 2015-09-05: 5 mg via ORAL
  Administered 2015-09-05: 10 mg via ORAL
  Filled 2015-09-02 (×5): qty 2
  Filled 2015-09-02: qty 1
  Filled 2015-09-02 (×3): qty 2
  Filled 2015-09-02: qty 1
  Filled 2015-09-02: qty 2
  Filled 2015-09-02: qty 1
  Filled 2015-09-02 (×2): qty 2

## 2015-09-02 MED ORDER — MENTHOL 3 MG MT LOZG: 1.0000 | LOZENGE | OROMUCOSAL | Status: DC | PRN

## 2015-09-02 MED ORDER — ACETAMINOPHEN 650 MG RE SUPP: 650.0000 mg | Freq: Four times a day (QID) | RECTAL | Status: DC | PRN

## 2015-09-02 MED ORDER — MIDAZOLAM HCL 5 MG/5ML IJ SOLN
INTRAMUSCULAR | Status: DC | PRN
  Administered 2015-09-02 (×2): 1 mg via INTRAVENOUS

## 2015-09-02 MED ORDER — OMALIZUMAB 150 MG ~~LOC~~ SOLR: 150.0000 mg | SUBCUTANEOUS | Status: DC

## 2015-09-02 MED ORDER — POLYETHYLENE GLYCOL 3350 17 G PO PACK
34.0000 g | PACK | Freq: Every day | ORAL | Status: DC
  Administered 2015-09-03 – 2015-09-04 (×2): 34 g via ORAL
  Filled 2015-09-02 (×3): qty 2

## 2015-09-02 MED ORDER — DARIFENACIN HYDROBROMIDE ER 7.5 MG PO TB24
7.5000 mg | ORAL_TABLET | Freq: Every day | ORAL | Status: DC
  Administered 2015-09-02 – 2015-09-04 (×3): 7.5 mg via ORAL
  Filled 2015-09-02 (×5): qty 1

## 2015-09-02 MED ORDER — FLUTICASONE PROPIONATE 50 MCG/ACT NA SUSP
2.0000 | Freq: Two times a day (BID) | NASAL | Status: DC
  Administered 2015-09-03 – 2015-09-05 (×5): 2 via NASAL
  Filled 2015-09-02 (×2): qty 16

## 2015-09-02 MED ORDER — BUPIVACAINE LIPOSOME 1.3 % IJ SUSP
INTRAMUSCULAR | Status: AC
  Filled 2015-09-02: qty 20

## 2015-09-02 MED ORDER — ONDANSETRON HCL 4 MG/2ML IJ SOLN: 4.0000 mg | Freq: Once | INTRAMUSCULAR | Status: DC | PRN

## 2015-09-02 MED ORDER — KETAMINE HCL 50 MG/ML IJ SOLN
INTRAMUSCULAR | Status: DC | PRN
  Administered 2015-09-02: 25 mg via INTRAMUSCULAR

## 2015-09-02 MED ORDER — SENNOSIDES-DOCUSATE SODIUM 8.6-50 MG PO TABS
1.0000 | ORAL_TABLET | Freq: Two times a day (BID) | ORAL | Status: DC
  Administered 2015-09-02 – 2015-09-05 (×6): 1 via ORAL
  Filled 2015-09-02 (×6): qty 1

## 2015-09-02 MED ORDER — CEFAZOLIN SODIUM-DEXTROSE 2-3 GM-% IV SOLR
2.0000 g | Freq: Four times a day (QID) | INTRAVENOUS | Status: AC
  Administered 2015-09-02 – 2015-09-03 (×4): 2 g via INTRAVENOUS
  Filled 2015-09-02 (×4): qty 50

## 2015-09-02 MED ORDER — ONDANSETRON HCL 4 MG PO TABS: 4.0000 mg | ORAL_TABLET | Freq: Four times a day (QID) | ORAL | Status: DC | PRN

## 2015-09-02 SURGICAL SUPPLY — 59 items
AUTOTRANSFUS HAS 1/8 (MISCELLANEOUS) ×3
BATTERY INSTRU NAVIGATION (MISCELLANEOUS) ×12 IMPLANT
BLADE SAW 1 (BLADE) ×3 IMPLANT
BLADE SAW 1/2 (BLADE) ×3 IMPLANT
BONE CEMENT GENTAMICIN (Cement) ×6 IMPLANT
BTRY SRG DRVR LF (MISCELLANEOUS) ×4
CANISTER SUCT 1200ML W/VALVE (MISCELLANEOUS) ×3 IMPLANT
CANISTER SUCT 3000ML (MISCELLANEOUS) ×6 IMPLANT
CAP KNEE TOTAL 3 SIGMA ×2 IMPLANT
CATH TRAY METER 16FR LF (MISCELLANEOUS) ×3 IMPLANT
CEMENT BONE GENTAMICIN 40 (Cement) IMPLANT
COOLER POLAR GLACIER W/PUMP (MISCELLANEOUS) ×3 IMPLANT
DRAPE SHEET LG 3/4 BI-LAMINATE (DRAPES) ×3 IMPLANT
DRSG DERMACEA 8X12 NADH (GAUZE/BANDAGES/DRESSINGS) ×3 IMPLANT
DRSG OPSITE POSTOP 4X14 (GAUZE/BANDAGES/DRESSINGS) ×3 IMPLANT
DURAPREP 26ML APPLICATOR (WOUND CARE) ×6 IMPLANT
ELECT CAUTERY BLADE 6.4 (BLADE) ×3 IMPLANT
EX-PIN ORTHOLOCK NAV 4X150 (PIN) ×6 IMPLANT
GLOVE BIOGEL M STRL SZ7.5 (GLOVE) ×6 IMPLANT
GLOVE INDICATOR 8.0 STRL GRN (GLOVE) ×3 IMPLANT
GLOVE SURG 9.0 ORTHO LTXF (GLOVE) ×3 IMPLANT
GLOVE SURG ORTHO 9.0 STRL STRW (GLOVE) ×3 IMPLANT
GOWN STRL REUS W/ TWL LRG LVL3 (GOWN DISPOSABLE) ×2 IMPLANT
GOWN STRL REUS W/ TWL LRG LVL4 (GOWN DISPOSABLE) ×1 IMPLANT
GOWN STRL REUS W/TWL 2XL LVL3 (GOWN DISPOSABLE) ×3 IMPLANT
GOWN STRL REUS W/TWL LRG LVL3 (GOWN DISPOSABLE) ×6
GOWN STRL REUS W/TWL LRG LVL4 (GOWN DISPOSABLE) ×3
HANDPIECE SUCTION TUBG SURGILV (MISCELLANEOUS) ×3 IMPLANT
HOLDER FOLEY CATH W/STRAP (MISCELLANEOUS) ×3 IMPLANT
HOOD PEEL AWAY FACE SHEILD DIS (HOOD) ×6 IMPLANT
KIT RM TURNOVER STRD PROC AR (KITS) ×3 IMPLANT
KNIFE SCULPS 14X20 (INSTRUMENTS) ×3 IMPLANT
NDL SAFETY 18GX1.5 (NEEDLE) ×3 IMPLANT
NDL SPNL 20GX3.5 QUINCKE YW (NEEDLE) ×1 IMPLANT
NEEDLE SPNL 20GX3.5 QUINCKE YW (NEEDLE) ×3 IMPLANT
NS IRRIG 500ML POUR BTL (IV SOLUTION) ×3 IMPLANT
PACK TOTAL KNEE (MISCELLANEOUS) ×3 IMPLANT
PAD GROUND ADULT SPLIT (MISCELLANEOUS) ×3 IMPLANT
PAD WRAPON POLAR KNEE (MISCELLANEOUS) ×1 IMPLANT
PIN DRILL QUICK PACK ×3 IMPLANT
PIN FIXATION 1/8DIA X 3INL (PIN) ×3 IMPLANT
SOL .9 NS 3000ML IRR  AL (IV SOLUTION) ×2
SOL .9 NS 3000ML IRR AL (IV SOLUTION) ×1
SOL .9 NS 3000ML IRR UROMATIC (IV SOLUTION) ×1 IMPLANT
SOL PREP PVP 2OZ (MISCELLANEOUS) ×3
SOLUTION PREP PVP 2OZ (MISCELLANEOUS) ×1 IMPLANT
SPONGE DRAIN TRACH 4X4 STRL 2S (GAUZE/BANDAGES/DRESSINGS) ×3 IMPLANT
STAPLER SKIN PROX 35W (STAPLE) ×3 IMPLANT
SUCTION FRAZIER TIP 10 FR DISP (SUCTIONS) ×3 IMPLANT
SUT VIC AB 0 CT1 36 (SUTURE) ×3 IMPLANT
SUT VIC AB 1 CT1 36 (SUTURE) ×6 IMPLANT
SUT VIC AB 2-0 CT2 27 (SUTURE) ×3 IMPLANT
SYR 20CC LL (SYRINGE) ×3 IMPLANT
SYR 30ML LL (SYRINGE) ×3 IMPLANT
SYR 50ML LL SCALE MARK (SYRINGE) ×3 IMPLANT
SYSTEM AUTOTRANSFUS DUAL TROCR (MISCELLANEOUS) ×1 IMPLANT
TOWEL OR 17X26 4PK STRL BLUE (TOWEL DISPOSABLE) ×3 IMPLANT
TOWER CARTRIDGE SMART MIX (DISPOSABLE) ×3 IMPLANT
WRAPON POLAR PAD KNEE (MISCELLANEOUS) ×3

## 2015-09-02 NOTE — Progress Notes (Signed)
Pt. Dangled at the bedside and tolerated it well.

## 2015-09-02 NOTE — Transfer of Care (Signed)
Immediate Anesthesia Transfer of Care Note  Patient: [name]  Procedure(s) Performed: Procedure(s): COMPUTER ASSISTED TOTAL KNEE ARTHROPLASTY (Right)  Patient Location: PACU  Anesthesia Type:Spinal  Level of Consciousness: awake, alert  and oriented  Airway & Oxygen Therapy: Patient Spontanous Breathing and Patient connected to face mask oxygen  Post-op Assessment: Report given to RN and Post -op Vital signs reviewed and stable  Post vital signs: Reviewed and stable  Last Vitals:  Filed Vitals:   09/02/15 1016  BP: 129/45  Pulse: 60  Temp: 37.2 C  Resp: 16    Complications: No apparent anesthesia complications

## 2015-09-02 NOTE — Op Note (Signed)
OPERATIVE NOTE  DATE OF SURGERY:  09/02/2015  PATIENT NAME:  JAZZ ROGALA   DOB: 1934/02/18  MRN: [id number]  PRE-OPERATIVE DIAGNOSIS: Degenerative arthrosis of the right knee, primary (superimposed on rheumatoid arthritis)  POST-OPERATIVE DIAGNOSIS:  Same  PROCEDURE:  Right total knee arthroplasty using computer-assisted navigation  SURGEON:  Marciano Sequin. M.D.  ASSISTANT:  Vance Peper, PA (present and scrubbed throughout the case, critical for assistance with exposure, retraction, instrumentation, and closure)  ANESTHESIA: spinal  ESTIMATED BLOOD LOSS: 200 mL  FLUIDS REPLACED: 1400 mL of crystalloid  TOURNIQUET TIME: 104 minutes  DRAINS: 2 medium drains to a reinfusion system  SOFT TISSUE RELEASES: Anterior cruciate ligament, posterior cruciate ligament, deep and superficial medial collateral ligament, patellofemoral ligament   IMPLANTS UTILIZED: DePuy PFC Sigma size 4 posterior stabilized femoral component (cemented), size 4 MBT tibial component (cemented), 38 mm 3 peg oval dome patella (cemented), and a 10 mm stabilized rotating platform polyethylene insert.  INDICATIONS FOR SURGERY: RIDHAAN DREIBELBIS is a 81 y.o. year old male with rheumatoid arthritis and a long history of progressive knee pain. X-rays demonstrated severe degenerative changes in tricompartmental fashion. The patient had not seen any significant improvement despite conservative nonsurgical intervention. After discussion of the risks and benefits of surgical intervention, the patient expressed understanding of the risks benefits and agree with plans for total knee arthroplasty.   The risks, benefits, and alternatives were discussed at length including but not limited to the risks of infection, bleeding, nerve injury, stiffness, blood clots, the need for revision surgery, cardiopulmonary complications, among others, and they were willing to proceed.  PROCEDURE IN DETAIL: The patient was brought into the operating  room and, after adequate spinal anesthesia was achieved, a tourniquet was placed on the patient's upper thigh. The patient's knee and leg were cleaned and prepped with alcohol and DuraPrep and draped in the usual sterile fashion. A "timeout" was performed as per usual protocol. The lower extremity was exsanguinated using an Esmarch, and the tourniquet was inflated to 300 mmHg. An anterior longitudinal incision was made followed by a standard mid vastus approach. The deep fibers of the medial collateral ligament were elevated in a subperiosteal fashion off of the medial flare of the tibia so as to maintain a continuous soft tissue sleeve. The patella was subluxed laterally and the patellofemoral ligament was incised. Inspection of the knee demonstrated severe degenerative changes with full-thickness loss of articular cartilage. Osteophytes were debrided using a rongeur. Anterior and posterior cruciate ligaments were excised. Two 4.0 mm Schanz pins were inserted in the femur and into the tibia for attachment of the array of trackers used for computer-assisted navigation. Hip center was identified using a circumduction technique. Distal landmarks were mapped using the computer. The distal femur and proximal tibia were mapped using the computer. The distal femoral cutting guide was positioned using computer-assisted navigation so as to achieve a 5 distal valgus cut. The femur was sized and it was felt that a size 4 femoral component was appropriate. A size 4 femoral cutting guide was positioned and the anterior cut was performed and verified using the computer. This was followed by completion of the posterior and chamfer cuts. Femoral cutting guide for the central box was then positioned in the center box cut was performed.  Attention was then directed to the proximal tibia. Medial and lateral menisci were excised. The extramedullary tibial cutting guide was positioned using computer-assisted navigation so as to  achieve a 0 varus-valgus alignment and  0 posterior slope. The cut was performed and verified using the computer. The proximal tibia was sized and it was felt that a size 4 tibial tray was appropriate. Tibial and femoral trials were inserted followed by insertion of a 10 mm polyethylene insert. The knee was felt to be tight both in flexion and extension. The trial components removed and the extramedullary tibial cutting guide was repositioned so as to resect an additional 2 mm of bone from the proximal tibia. This was performed and confirmed using the computer. Trial components were reinserted. This allowed for excellent mediolateral soft tissue balancing both in flexion and in full extension. Finally, the patella was cut and prepared so as to accommodate a 38 mm 3 peg oval dome patella. A patella trial was placed and the knee was placed through a range of motion with excellent patellar tracking appreciated. The femoral trial was removed after debridement of posterior osteophytes. The central post-hole for the tibial component was reamed followed by insertion of a keel punch. Tibial trials were then removed. Cut surfaces of bone were irrigated with copious amounts of normal saline with antibiotic solution using pulsatile lavage and then suctioned dry. Polymethylmethacrylate cement with gentamicin was prepared in the usual fashion using a vacuum mixer. Cement was applied to the cut surface of the proximal tibia as well as along the undersurface of a size 4 MBT tibial component. Tibial component was positioned and impacted into place. Excess cement was removed using [employer] fast streamer. Cement was then applied to the cut surfaces of the femur as well as along the posterior flanges of the size 4 femoral component. The femoral component was positioned and impacted into place. Excess cement was removed using [employer] fast streamer. A 10 mm polyethylene trial was inserted and the knee was brought into full extension with steady  axial compression applied. Finally, cement was applied to the backside of a 38 mm 3 peg oval dome patella and the patellar component was positioned and patellar clamp applied. Excess cement was removed using [employer] fast streamer. After adequate curing of the cement, the tourniquet was deflated after a total tourniquet time of 104 minutes. Hemostasis was achieved using electrocautery. The knee was irrigated with copious amounts of normal saline with antibiotic solution using pulsatile lavage and then suctioned dry. 20 mL of 1.3% Exparel in 40 mL of normal saline was injected along the posterior capsule, medial and lateral gutters, and along the arthrotomy site. A 10 mm stabilized rotating platform polyethylene insert was inserted and the knee was placed through a range of motion with excellent mediolateral soft tissue balancing appreciated and excellent patellar tracking noted. 2 medium drains were placed in the wound bed and brought out through separate stab incisions to be attached to a reinfusion system. The medial parapatellar portion of the incision was reapproximated using interrupted sutures of #1 Vicryl. Subcutaneous tissue was then injected with a total of 30 cc of 0.25% Marcaine with epinephrine. Subcutaneous tissue was approximated in layers using first #0 Vicryl followed #2-0 Vicryl. The skin was approximated with skin staples. A sterile dressing was applied.  The patient tolerated the procedure well and was transported to the recovery room in stable condition.    Jamil Armwood P. Holley Bouche., M.D.

## 2015-09-02 NOTE — Progress Notes (Signed)
Pt. autovac put out 90cc. Converted over to hemovac.

## 2015-09-02 NOTE — Progress Notes (Signed)
Foley patent. Pts. Pain controlled with meds per MAR. IS at bedside and pt. Encouraged to use it. Polarcare on and running. Tolerating PO's.

## 2015-09-02 NOTE — H&P (Signed)
The patient has been re-examined, and the chart reviewed, and there have been no interval changes to the documented history and physical.    The risks, benefits, and alternatives have been discussed at length. The patient expressed understanding of the risks benefits and agreed with plans for surgical intervention.  Elecia Serafin P. Maie Kesinger, Jr. M.D.    

## 2015-09-02 NOTE — Anesthesia Procedure Notes (Addendum)
Performed by: Demetrius Charity Pre-anesthesia Checklist: Patient identified, Emergency Drugs available, Suction available, Patient being monitored and Timeout performed Oxygen Delivery Method: Simple face mask   Spinal  Staffing Anesthesiologist: Gunnar Fusi Resident/CRNA: Demetrius Charity Performed by: anesthesiologist and resident/CRNA  Preanesthetic Checklist Completed: patient identified, site marked, surgical consent, pre-op evaluation, timeout performed, IV checked, risks and benefits discussed and monitors and equipment checked Spinal Block Patient position: sitting Prep: Betadine Patient monitoring: heart rate, cardiac monitor, continuous pulse ox and blood pressure Approach: midline Location: L3-4 Injection technique: single-shot Needle Needle type: Whitacre  Needle gauge: 25 G Needle length: 5 cm Assessment Sensory level: T10

## 2015-09-02 NOTE — Anesthesia Preprocedure Evaluation (Signed)
Anesthesia Evaluation  Patient identified by MRN, date of birth, ID band Patient awake    Reviewed: Allergy & Precautions, NPO status , Patient's Chart, lab work & pertinent test results  History of Anesthesia Complications Negative for: history of anesthetic complications  Airway Mallampati: II  TM Distance: >3 FB     Dental  (+) Teeth Intact   Pulmonary asthma , sleep apnea (hx, none now) ,           Cardiovascular hypertension, Pt. on medications and Pt. on home beta blockers + CAD       Neuro/Psych Anxiety Depression negative neurological ROS     GI/Hepatic GERD  Medicated,  Endo/Other    Renal/GU      Musculoskeletal  (+) Arthritis , Osteoarthritis,    Abdominal   Peds  Hematology  (+) anemia ,   Anesthesia Other Findings   Reproductive/Obstetrics                             Anesthesia Physical Anesthesia Plan  ASA: III  Anesthesia Plan: Spinal   Post-op Pain Management:    Induction: Intravenous  Airway Management Planned: Nasal Cannula  Additional Equipment:   Intra-op Plan:   Post-operative Plan:   Informed Consent: I have reviewed the patients History and Physical, chart, labs and discussed the procedure including the risks, benefits and alternatives for the proposed anesthesia with the patient or authorized representative who has indicated his/her understanding and acceptance.     Plan Discussed with:   Anesthesia Plan Comments:         Anesthesia Quick Evaluation

## 2015-09-02 NOTE — Brief Op Note (Signed)
09/02/2015  3:45 PM  PATIENT:  [name]  81 y.o. male  PRE-OPERATIVE DIAGNOSIS:  DEGENERATIVE OSTEOARTHRITIS RIGHT KNEE  POST-OPERATIVE DIAGNOSIS:  DEGENERATIVE OSTEOARTHRITIS RIGHT KNEE  PROCEDURE:  Procedure(s): COMPUTER ASSISTED TOTAL KNEE ARTHROPLASTY (Right)  SURGEON:  Surgeon(s) and Role:    * [name], MD - Primary  ASSISTANTS: Vance Peper, PA    ANESTHESIA:   spinal  EBL:  Total I/O In: 1400 [I.V.:1400] Out: 400 [Urine:200; Blood:200]  BLOOD ADMINISTERED:none  DRAINS: 2 medium drains to reinfusion system   LOCAL MEDICATIONS USED:  MARCAINE    and OTHER Exparel  SPECIMEN:  No Specimen  DISPOSITION OF SPECIMEN:  N/A  COUNTS:  YES  TOURNIQUET:  104 minutes  DICTATION: .Dragon Dictation  PLAN OF CARE: Admit to inpatient   PATIENT DISPOSITION:  PACU - hemodynamically stable.   Delay start of Pharmacological VTE agent (>24hrs) due to surgical blood loss or risk of bleeding: yes

## 2015-09-03 ENCOUNTER — Encounter: Payer: Self-pay | Admitting: Orthopedic Surgery

## 2015-09-03 LAB — CBC
HCT: 28.6 % — ABNORMAL LOW (ref 40.0–52.0)
Hemoglobin: 9.8 g/dL — ABNORMAL LOW (ref 13.0–18.0)
MCH: 31.6 pg (ref 26.0–34.0)
MCHC: 34.1 g/dL (ref 32.0–36.0)
MCV: 92.8 fL (ref 80.0–100.0)
PLATELETS: 110 10*3/uL — AB (ref 150–440)
RBC: 3.09 MIL/uL — AB (ref 4.40–5.90)
RDW: 16.8 % — AB (ref 11.5–14.5)
WBC: 6.5 10*3/uL (ref 3.8–10.6)

## 2015-09-03 LAB — BASIC METABOLIC PANEL
Anion gap: 7 (ref 5–15)
BUN: 12 mg/dL (ref 6–20)
CALCIUM: 7.6 mg/dL — AB (ref 8.9–10.3)
CO2: 29 mmol/L (ref 22–32)
CREATININE: 0.7 mg/dL (ref 0.61–1.24)
Chloride: 98 mmol/L — ABNORMAL LOW (ref 101–111)
GFR calc Af Amer: >60 mL/min (ref >60)
GLUCOSE: 116 mg/dL — AB (ref 65–99)
POTASSIUM: 3.9 mmol/L (ref 3.5–5.1)
SODIUM: 134 mmol/L — AB (ref 135–145)

## 2015-09-03 MED ORDER — ENOXAPARIN SODIUM 30 MG/0.3ML ~~LOC~~ SOLN: 30.0000 mg | Freq: Two times a day (BID) | SUBCUTANEOUS | Status: DC

## 2015-09-03 MED ORDER — OMALIZUMAB 150 MG ~~LOC~~ SOLR: 150.0000 mg | SUBCUTANEOUS | Status: DC

## 2015-09-03 MED ORDER — TRAMADOL HCL 50 MG PO TABS: 50.0000 mg | ORAL_TABLET | ORAL | Status: DC | PRN

## 2015-09-03 MED ORDER — OXYCODONE HCL 5 MG PO TABS: 5.0000 mg | ORAL_TABLET | ORAL | Status: DC | PRN

## 2015-09-03 NOTE — Discharge Instructions (Signed)

## 2015-09-03 NOTE — Clinical Social Work Note (Signed)
Clinical Social Work Assessment  Patient Details  Name: [name] MRN: [id number] Date of Birth: 06/10/1934  Date of referral:  09/03/15               Reason for consult:  Facility Placement                Permission sought to share information with:  Chartered certified accountant granted to share information::  Yes, Verbal Permission Granted  Name::      [name]::   [facility]   Relationship::     Contact Information:     Housing/Transportation Living arrangements for the past 2 months:  [place] of Information:  Patient Patient Interpreter Needed:  None Criminal Activity/Legal Involvement Pertinent to Current Situation/Hospitalization:  No - Comment as needed Significant Relationships:  Adult Children, Spouse Lives with:  Spouse Do you feel safe going back to the place where you live?  Yes Need for family participation in patient care:  Yes (Comment)  Care giving concerns: Patient lives with his wife [name] in [place].   Social Worker assessment / plan: [place] representative (CSW) received SNF consult. PT is recommending rehab. CSW met with patient to discuss D/C plan. Patient was sitting up in bed and alert and oriented. Patient reported that he lives with his wife [name] who is [place] decent. Patient reported that they have 2 children, 1 son and 1 daughter. Patient is agreeable to SNF search and prefers [facility] Per patient he was at [facility] last year for 3 weeks. CSW explained that patient's insurance [employer] will require authorization. Patient reported that [employer] denied him last year and Dr. [name] had to do a peer to peer with a [place].  Per Maudie Mercury admissions coordinator at [facility] they can accept patient and will start [facility] authorization today. Patient is aware of above.    Employment status:  Retired Nurse, adult PT Recommendations:  [place] / Referral to community resources:  [place]  Patient/Family's Response to care:  Patient is agreeable to go to [place].   Patient/Family's Understanding of and Emotional Response to Diagnosis, Current Treatment, and Prognosis: Patient was pleasant and thanked CSW for visit.   Emotional Assessment Appearance:  Appears stated age Attitude/Demeanor/Rapport:    Affect (typically observed):  Accepting, Adaptable, Pleasant Orientation:  Oriented to Self, Oriented to Place, Oriented to  Time, Oriented to Situation Alcohol / Substance use:  Not Applicable Psych involvement (Current and /or in the community):  No (Comment)  Discharge Needs  Concerns to be addressed:  Discharge Planning Concerns Readmission within the last 30 days:  No Current discharge risk:  Dependent with Mobility Barriers to Discharge:  Continued Medical Work up   Loralyn Freshwater, LCSW 09/03/2015, 3:14 PM

## 2015-09-03 NOTE — Evaluation (Signed)
Physical Therapy Evaluation Patient Details Name: [name] MRN: [id number] DOB: 01-05-1934 Today's Date: 09/03/2015   History of Present Illness  Pt is a 81 y.o. male s/p R TKA secondary to degenerative arthrosis superimposed on RA.  Pt with h/o neurogenic bladder and also L TKA September 2015.  Clinical Impression  Currently pt demonstrates impairments with R LE strength, R knee ROM, R knee pain, balance,  and limitations with functional mobility.  Prior to admission, pt was modified independent with RW; pt denies any recent falls.  Pt lives with his wife on 1st level of 2 story home with 4 steps to enter with railings.  Currently pt is max assist supine to sit; max assist to stand with RW; mod assist to ambulate a few steps with RW bed to recliner.  Pt unable to perform R LE SLR requiring R knee immobilizer for OOB mobility.  Pt would benefit from skilled PT to address above noted impairments and functional limitations.  Recommend pt discharge to STR when medically appropriate.     Follow Up Recommendations SNF    Equipment Recommendations       Recommendations for Other Services       Precautions / Restrictions Precautions Precautions: Fall Required Braces or Orthoses: Knee Immobilizer - Right Knee Immobilizer - Right: Discontinue once straight leg raise with < 10 degree lag Restrictions Weight Bearing Restrictions: Yes RLE Weight Bearing: Weight bearing as tolerated      Mobility  Bed Mobility Overal bed mobility: Needs Assistance Bed Mobility: Supine to Sit     Supine to sit: Max assist;HOB elevated     General bed mobility comments: assist for trunk and R LE; vc's for technique required  Transfers Overall transfer level: Needs assistance Equipment used: Rolling walker (2 wheeled) Transfers: Sit to/from Stand Sit to Stand: Max assist         General transfer comment: vc's required for hand and feet placement and technique; R KI donned; increased assist and  time to stand required  Ambulation/Gait Ambulation/Gait assistance: Mod assist Ambulation Distance (Feet): 3 Feet Assistive device: Rolling walker (2 wheeled)   Gait velocity: decreased   General Gait Details: step to gait; antalgic; decreased stance time R LE; vc's required for walker advancement and gait technique; R KI donned  Stairs            Wheelchair Mobility    Modified Rankin (Stroke Patients Only)       Balance Overall balance assessment: Needs assistance Sitting-balance support: Bilateral upper extremity supported;Feet supported Sitting balance-[name]: Fair   Postural control: Left lateral lean (d/t R knee pain) Standing balance support: Bilateral upper extremity supported (on RW) Standing balance-[name]: Fair                               Pertinent Vitals/Pain Pain Assessment: 0-10 Pain Score: 9  Pain Location: R knee Pain Descriptors / Indicators: Sharp;Shooting;Sore;Tender Pain Intervention(s): Limited activity within patient's tolerance;Monitored during session;Premedicated before session;Repositioned  Vitals stable and WFL throughout treatment session.    Home Living Family/patient expects to be discharged to:: Skilled nursing facility Living Arrangements: Spouse/significant other   Type of Home: House Home Access: Stairs to enter Entrance Stairs-Rails: Right;Left Entrance Stairs-Number of Steps: 4 Home Layout: Two level;Able to live on main level with bedroom/bathroom Home Equipment: Gilford Rile - 2 wheels;Cane - single point      Prior Function Level of Independence: Independent with assistive  device(s)         Comments: Using RW prior to admission d/t knee pain     Hand Dominance        Extremity/Trunk Assessment   Upper Extremity Assessment: Overall WFL for tasks assessed           Lower Extremity Assessment: RLE deficits/detail;LLE deficits/detail RLE Deficits / Details: R hip flexion 2+/5; R knee  extension at least 2/5; R knee flexion at least 2/5; R DF at least 3+/5 (limited strength assessment d/t R knee pain) LLE Deficits / Details: L LE strength and ROM WFL     Communication   Communication: No difficulties  Cognition Arousal/Alertness: Awake/alert Behavior During Therapy: Anxious Overall Cognitive Status: Within Functional Limits for tasks assessed       Memory: Decreased recall of precautions              General Comments General comments (skin integrity, edema, etc.): dressing intact  Nursing cleared pt for participation in physical therapy.  Pt agreeable to PT session.    Exercises Total Joint Exercises Goniometric ROM: R knee flexion AAROM 75 degrees in sitting; R knee extension (semi-supine) AAROM 24 degrees short of neutral  Performed semi-supine B LE therapeutic exercise x 10 reps:  Ankle pumps (AROM B LE's); quad sets x3 second holds (AROM B LE's); SAQ's (AAROM R; AROM L); heelslides (AAROM R; AROM L), hip abd/adduction (AAROM R; AROM L), and SLR (AAROM R; AROM L).  Pt required vc's and tactile cues for correct technique with exercises. Pt required increased time and cueing for therapeutic activity d/t R knee pain and anxiety with activity.       Assessment/Plan    PT Assessment Patient needs continued PT services  PT Diagnosis Difficulty walking;Acute pain   PT Problem List Decreased strength;Decreased range of motion;Decreased activity tolerance;Decreased balance;Decreased mobility;Decreased knowledge of use of DME;Decreased knowledge of precautions;Pain  PT Treatment Interventions DME instruction;Gait training;Stair training;Functional mobility training;Therapeutic activities;Therapeutic exercise;Balance training;Patient/family education   PT Goals (Current goals can be found in the Care Plan section) Acute Rehab PT Goals Patient Stated Goal: To go to rehab PT Goal Formulation: With patient Time For Goal Achievement: 09/17/15 Potential to Achieve  Goals: Good    Frequency BID   Barriers to discharge Decreased caregiver support      Co-evaluation               End of Session Equipment Utilized During Treatment: Gait belt;Right knee immobilizer Activity Tolerance: Patient limited by pain Patient left: in chair;with call bell/phone within reach;with chair alarm set;with SCD's reapplied (L LE heel elevated via pillow; R LE heel elevated via towel roll; polar care applied) Nurse Communication: Mobility status (Pain status)         Time: 6440-3474 PT Time Calculation (min) (ACUTE ONLY): 46 min   Charges:   PT Evaluation $Initial PT Evaluation Tier I: 1 Procedure PT Treatments $Therapeutic Exercise: 8-22 mins $Therapeutic Activity: 8-22 mins   PT G CodesLeitha Bleak 09/28/2015, 11:22 AM Leitha Bleak, [place]

## 2015-09-03 NOTE — Evaluation (Signed)
Occupational Therapy Evaluation Patient Details Name: [name] MRN: [id number] DOB: Apr 25, 1934 Today's Date: 09/03/2015    History of Present Illness This patient is a 81 year old male who came to [facility] for a R TKR. He had his left knee replaced last year.   Clinical Impression   This patient is a 81 year old male  who came to [facility] for a R total knee replacement.  Patient lives in a  home with his wife.  He had been independent with ADL and functional mobility. He now requires  assistance and would benefit from Occupational Therapy for ADL/functioal mobility training.      Follow Up Recommendations       Equipment Recommendations       Recommendations for Other Services       Precautions / Restrictions Precautions Precautions: Fall Required Braces or Orthoses: Knee Immobilizer - Right Restrictions Weight Bearing Restrictions: Yes RLE Weight Bearing: Weight bearing as tolerated      Mobility Bed Mobility                  Transfers                      Balance                                            ADL                                         General ADL Comments: Patient had been independent . Today practiced techniques for lower body dressing using hip kit. Practiced Donned/doffed socks and pants to knees (drain still in place). He needed moderate assist with verbal cues for proper techniques.      Vision     Perception     Praxis      Pertinent Vitals/Pain Pain Assessment: 0-10 Pain Score: 9      Hand Dominance     Extremity/Trunk Assessment Upper Extremity Assessment Upper Extremity Assessment: Overall WFL for tasks assessed   Lower Extremity Assessment Lower Extremity Assessment: Defer to PT evaluation       Communication     Cognition Arousal/Alertness: Awake/alert (A little sleepy) Behavior During Therapy: WFL for tasks assessed/performed Overall  Cognitive Status: Within Functional Limits for tasks assessed                     General Comments       Exercises       Shoulder Instructions      Home Living Family/patient expects to be discharged to:: Skilled nursing facility Living Arrangements: Spouse/significant other                                      Prior Functioning/Environment Level of Independence: Independent             OT Diagnosis:  R TKR   OT Problem List:     OT Treatment/Interventions:   activities of daily living training.    OT Goals(Current goals can be found in the care plan section)    OT Frequency:     Barriers to D/C:  Co-evaluation              End of Session    Activity Tolerance:   Patient left:  in chair with alarm set, phone and call bell in reach.   Time:  -    Charges:    G-Codes:    Jahmeir, Geisen, MS/OTR/L  09/03/2015, 10:34 AM

## 2015-09-03 NOTE — Progress Notes (Signed)
POD 1. Pt. Alert and oriented. Vss. Pain controlled with meds per MAR. Foley patent. Heels up on towel rolls. IS at the bedside and pt. Encouraged to use it. Tolerating PO's. Polarcare on and running. Pt. Dangled at the bedside yesterday afternoon. Rested quietly with eyes closed throughout the night. Will continue to monitor.

## 2015-09-03 NOTE — Progress Notes (Signed)
Physical Therapy Treatment Patient Details Name: [name] MRN: [id number] DOB: 02-Jun-1934 Today's Date: 09/03/2015    History of Present Illness Pt is a 81 y.o. male s/p R TKA secondary to degenerative arthrosis superimposed on RA.  Pt with h/o neurogenic bladder and also L TKA September 2015.    PT Comments    Pt able to progress ambulation this afternoon to 40 feet with RW and 2 assist for safety.  Pt still requiring R knee immobilizer for functional mobility d/t quad weakness.  Pt requires significant assist and extra time to stand but pt appearing very motivated to participate in PT and improve his functional mobility.  Continue to recommend pt discharge to STR when medically appropriate.   Follow Up Recommendations  SNF     Equipment Recommendations       Recommendations for Other Services       Precautions / Restrictions Precautions Precautions: Fall Required Braces or Orthoses: Knee Immobilizer - Right Knee Immobilizer - Right: Discontinue once straight leg raise with < 10 degree lag Restrictions Weight Bearing Restrictions: Yes RLE Weight Bearing: Weight bearing as tolerated    Mobility  Bed Mobility Overal bed mobility: Needs Assistance Bed Mobility: Sit to Supine       Sit to supine: Min assist;Mod assist;+2 for physical assistance   General bed mobility comments: assist for trunk and R LE; vc's for technique required  Transfers Overall transfer level: Needs assistance Equipment used: Rolling walker (2 wheeled) Transfers: Sit to/from Stand Sit to Stand: Max assist;+2 safety/equipment (from recliner and from bed)         General transfer comment: vc's required for hand and feet placement and technique; R KI donned; increased assist and time to stand required  Ambulation/Gait Ambulation/Gait assistance: Min guard;Min assist;+2 physical assistance;+2 safety/equipment Ambulation Distance (Feet): 40 Feet Assistive device: Rolling walker (2 wheeled)    Gait velocity: decreased   General Gait Details: step to gait; antalgic; decreased stance time R LE; vc's required for walker advancement and gait technique; R KI donned   Stairs            Wheelchair Mobility    Modified Rankin (Stroke Patients Only)       Balance Overall balance assessment: Needs assistance Sitting-balance support: Bilateral upper extremity supported;Feet supported Sitting balance-[name]: Fair   Postural control: Left lateral lean (d/t R knee pain)   Standing balance-[name]: Fair                      Cognition Arousal/Alertness: Awake/alert Behavior During Therapy: Anxious Overall Cognitive Status: Within Functional Limits for tasks assessed       Memory: Decreased recall of precautions              Exercises      General Comments General comments (skin integrity, edema, etc.): dressing intact  Just prior to laying down, pt went to move a blanket with his left hand and caught his peripheral IV on the blanket pulling it out.  Pressure applied to site immediately and nursing called immediately regarding peripheral IV; nursing then came to address pt's needs.      Pertinent Vitals/Pain Pain Assessment: 0-10 Pain Score: 8  Pain Location: R knee and low back Pain Descriptors / Indicators: Sore;Tender Pain Intervention(s): Limited activity within patient's tolerance;Monitored during session;Repositioned;Patient requesting pain meds-RN notified;RN gave pain meds during session  Vitals stable and WFL throughout treatment session.    Home Living  Prior Function            PT Goals (current goals can now be found in the care plan section) Acute Rehab PT Goals Patient Stated Goal: To go to rehab PT Goal Formulation: With patient Time For Goal Achievement: 09/17/15 Potential to Achieve Goals: Good Progress towards PT goals: Progressing toward goals    Frequency  BID    PT Plan Current  plan remains appropriate    Co-evaluation             End of Session Equipment Utilized During Treatment: Gait belt;Right knee immobilizer Activity Tolerance: Patient limited by pain Patient left: in bed;with call bell/phone within reach;with bed alarm set;with SCD's reapplied (B towel rolls placed to elevated B heels; polar care in place)     Time: 4492-0100 PT Time Calculation (min) (ACUTE ONLY): 43 min  Charges:  $Gait Training: 8-22 mins $Therapeutic Activity: 23-37 mins                    G CodesLeitha Bleak 2015-09-12, 4:43 PM Leitha Bleak, [place]

## 2015-09-03 NOTE — Progress Notes (Signed)
PHARMACIST - PHYSICIAN ORDER COMMUNICATION  CONCERNING: P&T Medication Policy on Herbal Medications  DESCRIPTION:  This patient's order for:  Selenium caps  has been noted.  This product(s) is classified as an "herbal" or natural product. Due to a lack of definitive safety studies or FDA approval, nonstandard manufacturing practices, plus the potential risk of unknown drug-drug interactions while on inpatient medications, the Pharmacy and Therapeutics Committee does not permit the use of "herbal" or natural products of this type within [facility].   ACTION TAKEN: The pharmacy department is unable to verify this order at this time and your patient has been informed of this safety policy. Please reevaluate patient's clinical condition at discharge and address if the herbal or natural product(s) should be resumed at that time.

## 2015-09-03 NOTE — Progress Notes (Signed)
Dr.Hooten notified of diastolic bp. Will continue to monitor.

## 2015-09-03 NOTE — Anesthesia Postprocedure Evaluation (Signed)
  Anesthesia Post-op Note  Patient: [name]  Procedure(s) Performed: Procedure(s): COMPUTER ASSISTED TOTAL KNEE ARTHROPLASTY (Right)  Anesthesia type:Spinal  Patient location: PACU  Post pain: Pain level controlled  Post assessment: Post-op Vital signs reviewed, Patient's Cardiovascular Status Stable, Respiratory Function Stable, Patent Airway and No signs of Nausea or vomiting  Post vital signs: Reviewed and stable  Last Vitals:  Filed Vitals:   09/03/15 0507  BP: 127/52  Pulse: 67  Temp:   Resp:     Level of consciousness: awake, alert  and patient cooperative  Complications: No apparent anesthesia complications.  MAE equal.

## 2015-09-03 NOTE — Progress Notes (Cosign Needed)
   Subjective: 1 Day Post-Op Procedure(s) (LRB): COMPUTER ASSISTED TOTAL KNEE ARTHROPLASTY (Right) Patient reports pain as 8 on 0-10 scale.   Patient is well, and has had no acute complaints or problems We will start therapy today.  Plan is to go Rehab after hospital stay. no nausea and no vomiting Patient denies any chest pains or shortness of breath. Objective: Vital signs in last 24 hours: Temp:  [97.5 F (36.4 C)-98.9 F (37.2 C)] 98.3 F (36.8 C) (10/06 0505) Pulse Rate:  [49-67] 67 (10/06 0507) Resp:  [11-19] 18 (10/06 0505) BP: (117-169)/(32-96) 127/52 mmHg (10/06 0507) SpO2:  [60 %-100 %] 93 % (10/06 0505) Still has original dressing in place Heels are non tender and elevated off the bed using rolled towels Intake/Output from previous day: 10/05 0701 - 10/06 0700 In: 3696.3 [I.V.:2683.3; Blood:438; IV Piggyback:300] Out: 4010 [Urine:2730; Drains:665; Blood:200] Intake/Output this shift: Total I/O In: -  Out: 1000 [Urine:1000]   Recent Labs  09/03/15 0453  HGB 9.8*    Recent Labs  09/03/15 0453  WBC 6.5  RBC 3.09*  HCT 28.6*  PLT 110*    Recent Labs  09/03/15 0453  NA 134*  K 3.9  CL 98*  CO2 29  BUN 12  CREATININE 0.70  GLUCOSE 116*  CALCIUM 7.6*   No results for input(s): LABPT, INR in the last 72 hours.  EXAM General - Patient is Alert, Appropriate and Oriented Extremity - Neurologically intact Neurovascular intact Sensation intact distally Intact pulses distally Dorsiflexion/Plantar flexion intact Dressing - dressing C/D/I Motor Function - intact, moving foot and toes well on exam. Poor quad tone. Unable to do SLR  Past Medical History  Diagnosis Date  . CAD (coronary artery disease)   . Neurogenic bladder   . Asthma   . Chronic sinusitis   . Diverticulitis   . GERD (gastroesophageal reflux disease)   . Arthritis     rheumatoid  . Cancer [facility])     Prostate  . IBS (irritable bowel syndrome)   . Anxiety   . Neurodermatitis    . ED (erectile dysfunction)   . OSA (obstructive sleep apnea)   . Allergy   . Hypertension   . Depression   . Hx of adenomatous colonic polyps   . History of SIADH   . Hyponatremia   . Chronic kidney disease     permnaent foley catheter  . Anemia     Assessment/Plan: 1 Day Post-Op Procedure(s) (LRB): COMPUTER ASSISTED TOTAL KNEE ARTHROPLASTY (Right) Active Problems:   Total knee replacement status  Estimated body mass index is 31.39 kg/(m^2) as calculated from the following:   Height as of 08/26/15: 5' 11.5" (1.816 m).   Weight as of 03/31/15: 103.511 kg (228 lb 3.2 oz). Advance diet Up with therapy D/C IV fluids Discharge to SNF on Saturday   Labs: reviewed. Need to watch Na DVT Prophylaxis - Lovenox, Foot Pumps and TED hose Weight-Bearing as tolerated to right  Leg Need to start working on a bowel movement Please give oral pain meds.(alternating) every two hours prn. Try not to give the morphine any more than needed. D/C O2 and Pulse OX and try on Room Air Will need to leave foley in place. This is a chronic foley 2/2 neuropathy   [name] R. [place] 09/03/2015, 7:58 AM

## 2015-09-03 NOTE — Care Management Note (Signed)
Case Management Note  Patient Details  Name: [name] MRN: [id number] Date of Birth: [birth year]  Subjective/Objective:                  Met with patient as he worked with [name] OT. He wants to go to [facility] at discharge and states he has already arranged this through [facility] [place]. He has a rolling walker at home. He went to [facility] last visit and no history of home health. He uses Oncologist on [address] for Rx. CSW aware; PT pending.  Action/Plan: List of home health agencies left with patient. RNCM will continue to follow.   Expected Discharge Date:                  Expected Discharge Plan:     In-House Referral:  Clinical Social Work  Discharge planning Services  CM Consult  Post Acute Care Choice:  Home Health Choice offered to:  Patient  DME Arranged:    DME Agency:     HH Arranged:    [place] Agency:     Status of Service:  In process, will continue to follow  Medicare Important Message Given:    Date Medicare IM Given:    Medicare IM give by:    Date Additional Medicare IM Given:    Additional Medicare Important Message give by:     If discussed at [place] of Stay Meetings, dates discussed:    Additional Comments:  Serge Main, RN 09/03/2015, 11:08 AM

## 2015-09-03 NOTE — Clinical Social Work Placement (Signed)
   CLINICAL SOCIAL WORK PLACEMENT  NOTE  Date:  09/03/2015  Patient Details  Name: PAVLOS YON MRN: [id number] Date of Birth: 1934-09-25  Clinical Social Work is seeking post-discharge placement for this patient at the [place] level of care (*CSW will initial, date and re-position this form in  chart as items are completed):  Yes   Patient/family provided with [facility] Work Department's list of facilities offering this level of care within the geographic area requested by the patient (or if unable, by the patient's family).  Yes   Patient/family informed of their freedom to choose among providers that offer the needed level of care, that participate in Medicare, Medicaid or managed care program needed by the patient, have an available bed and are willing to accept the patient.  Yes   Patient/family informed of [facility]'s ownership interest in [facility] and [facility], as well as of the fact that they are under no obligation to receive care at these facilities.  PASRR submitted to EDS on       PASRR number received on       Existing PASRR number confirmed on 09/03/15     FL2 transmitted to all facilities in geographic area requested by pt/family on 09/03/15     FL2 transmitted to all facilities within larger geographic area on       Patient informed that his/her managed care company has contracts with or will negotiate with certain facilities, including the following:        Yes   Patient/family informed of bed offers received.  Patient chooses bed at  [facility] )     Physician recommends and patient chooses bed at      Patient to be transferred to   on  .  Patient to be transferred to facility by       Patient family notified on   of transfer.  Name of family member notified:        PHYSICIAN Please sign FL2     Additional Comment:    _______________________________________________ Loralyn Freshwater,  LCSW 09/03/2015, 3:13 PM

## 2015-09-04 ENCOUNTER — Encounter
Admission: RE | Admit: 2015-09-04 | Discharge: 2015-09-04 | Disposition: A | Discharge status: Home / Self Care | Payer: Medicare PPO | Source: Ambulatory Visit | Attending: Internal Medicine | Admitting: Internal Medicine

## 2015-09-04 DIAGNOSIS — D649 Anemia, unspecified: Secondary | ICD-10-CM | POA: Insufficient documentation

## 2015-09-04 DIAGNOSIS — E871 Hypo-osmolality and hyponatremia: Secondary | ICD-10-CM | POA: Insufficient documentation

## 2015-09-04 LAB — CBC
HCT: 25.7 % — ABNORMAL LOW (ref 40.0–52.0)
HEMOGLOBIN: 8.9 g/dL — AB (ref 13.0–18.0)
MCH: 31.6 pg (ref 26.0–34.0)
MCHC: 34.7 g/dL (ref 32.0–36.0)
MCV: 91.2 fL (ref 80.0–100.0)
PLATELETS: 105 10*3/uL — AB (ref 150–440)
RBC: 2.82 MIL/uL — AB (ref 4.40–5.90)
RDW: 16.2 % — ABNORMAL HIGH (ref 11.5–14.5)
WBC: 7.3 10*3/uL (ref 3.8–10.6)

## 2015-09-04 MED ORDER — LACTULOSE 10 GM/15ML PO SOLN
10.0000 g | Freq: Two times a day (BID) | ORAL | Status: DC | PRN
  Administered 2015-09-04: 10 g via ORAL
  Filled 2015-09-04: qty 30

## 2015-09-04 NOTE — Progress Notes (Signed)
POD 2.Pt. Alert and oriented. VSS. Pain controlled with PO pain meds. Foley patent. Polarcare on and running. IS at the bedside and pt. Encouraged to use it. Heels elevated off of bed with towel rolls. Rested quietly throughout the night.

## 2015-09-04 NOTE — Progress Notes (Signed)
Continued constipation. Pt agreed to lactulose and warm prune juice. Neuro checks wnl.pain relieved with present regime

## 2015-09-04 NOTE — Discharge Summary (Signed)
Physician Discharge Summary  Patient ID: [name] MRN: [id number] DOB/AGE: 79-Apr-1935 79 y.o.  Admit date: 09/02/2015 Discharge date: 09/05/2015  Admission Diagnoses:  [place] RIGHT KNEE   Discharge Diagnoses: Patient Active Problem List   Diagnosis Date Noted  . Total knee replacement status 09/02/2015  . Presence of indwelling urinary catheter 08/20/2015  . Advanced directives, counseling/discussion 07/28/2014  . Preoperative examination 07/28/2014  . Routine general medical examination at a health care facility 04/09/2012  . History of prostate cancer 04/09/2012  . Venous stasis dermatitis 06/13/2011  . Chronic sinusitis   . CONSTIPATION, CHRONIC 12/07/2010  . Hyperlipemia 12/02/2010  . OSTEOARTHRITIS 12/02/2010  . NEUROGENIC BLADDER 05/24/2010  . IBS 05/05/2010  . PERSONAL HISTORY OF COLONIC POLYPS 05/05/2010  . NEUROPATHY 05/03/2010  . Essential hypertension, benign 03/25/2009  . GAD (generalized anxiety disorder) 01/27/2007  . Coronary atherosclerosis of native coronary artery 01/27/2007  . Allergic asthma 01/27/2007  . GERD 01/27/2007  . ERECTILE DYSFUNCTION, ORGANIC 01/27/2007  . Chronic rheumatic arthritis ([place]) 01/27/2007    Past Medical History  Diagnosis Date  . CAD (coronary artery disease)   . Neurogenic bladder   . Asthma   . Chronic sinusitis   . Diverticulitis   . GERD (gastroesophageal reflux disease)   . Arthritis     rheumatoid  . Cancer [facility])     Prostate  . IBS (irritable bowel syndrome)   . Anxiety   . Neurodermatitis   . ED (erectile dysfunction)   . OSA (obstructive sleep apnea)   . Allergy   . Hypertension   . Depression   . Hx of adenomatous colonic polyps   . History of SIADH   . Hyponatremia   . Chronic kidney disease     permnaent foley catheter  . Anemia      Transfusion: Autovac transfusion given the first 6 hours postop   Consultants (if any):   case management for placement  Discharged  Condition: Improved  Hospital Course: [name] is an 81 y.o. male who was admitted 09/02/2015 with a diagnosis of degenerative arthrosis right knee and went to the operating room on 09/02/2015 and underwent the above named procedures. The patient has an indwelling Foley catheter and has been having some leakage around the catheter. Patient has spasms that have been creating some of this problem and has an appointment with urology. The patient on postop day 3 had a hemoglobin of 8.2.   Surgeries:Procedure(s): COMPUTER ASSISTED TOTAL KNEE ARTHROPLASTY on 09/02/2015  PRE-OPERATIVE DIAGNOSIS: Degenerative arthrosis of the right knee, primary (superimposed on rheumatoid arthritis)  POST-OPERATIVE DIAGNOSIS: Same  PROCEDURE: Right total knee arthroplasty using computer-assisted navigation  SURGEON: Marciano Sequin. M.D.  ASSISTANT: Vance Peper, PA (present and scrubbed throughout the case, critical for assistance with exposure, retraction, instrumentation, and closure)  ANESTHESIA: spinal  ESTIMATED BLOOD LOSS: 200 mL  FLUIDS REPLACED: 1400 mL of crystalloid  TOURNIQUET TIME: 104 minutes  DRAINS: 2 medium drains to a reinfusion system  SOFT TISSUE RELEASES: Anterior cruciate ligament, posterior cruciate ligament, deep and superficial medial collateral ligament, patellofemoral ligament   IMPLANTS UTILIZED: DePuy PFC Sigma size 4 posterior stabilized femoral component (cemented), size 4 MBT tibial component (cemented), 38 mm 3 peg oval dome patella (cemented), and a 10 mm stabilized rotating platform polyethylene insert.  INDICATIONS FOR SURGERY: [name] is a 81 y.o. year old male with rheumatoid arthritis and a long history of progressive knee pain. X-rays demonstrated severe degenerative changes in tricompartmental  fashion. The patient had not seen any significant improvement despite conservative nonsurgical intervention. After discussion of the risks and benefits of surgical  intervention, the patient expressed understanding of the risks benefits and agree with plans for total knee arthroplasty.   The risks, benefits, and alternatives were discussed at length including but not limited to the risks of infection, bleeding, nerve injury, stiffness, blood clots, the need for revision surgery, cardiopulmonary complications, among others, and they were willing to proceed. Patient tolerated the surgery well. No complications .Patient was taken to PACU where she was stabilized and then transferred to the orthopedic floor.  Patient started on Lovenox 30 q 12 hrs. Foot pumps applied bilaterally at 80 mm hg . Heels elevated off bed with rolled towels. No evidence of DVT. Calves non tender. Negative Homan. Physical therapy started on day #1 for gait training and transfer with OT starting on  day #1 for ADL and assisted devices. Patient has done well with therapy. Ambulated 40  feet upon being discharged.  Patient's IV , foley was discontinued on day #1 and hemovac was d/c on day #2.   He was given perioperative antibiotics:  Anti-infectives    Start     Dose/Rate Route Frequency Ordered Stop   09/02/15 1800  ceFAZolin (ANCEF) IVPB 2 g/50 mL premix     2 g 100 mL/hr over 30 Minutes Intravenous Every 6 hours 09/02/15 1716 09/03/15 1300   09/02/15 1015  ceFAZolin (ANCEF) IVPB 2 g/50 mL premix     2 g 100 mL/hr over 30 Minutes Intravenous  Once 09/02/15 1004 09/02/15 1203   09/02/15 1006  ceFAZolin (ANCEF) 2-3 GM-% IVPB SOLR    Comments:  KENNEDY, ASHLEY: cabinet override      09/02/15 1006 09/02/15 2214    .  He was fitted with AV 1 compression foot pump devices, early ambulation, and TED stockings and Lovenox for DVT prophylaxis.  He benefited maximally from the hospital stay and there were no complications.    Recent vital signs:  Filed Vitals:   09/04/15 0729  BP: 142/51  Pulse: 75  Temp: 98.4 F (36.9 C)  Resp: 18    Recent laboratory studies:  Lab Results   Component Value Date   HGB 8.9* 09/04/2015   HGB 9.8* 09/03/2015   HGB 12.8* 08/26/2015   Lab Results  Component Value Date   WBC 7.3 09/04/2015   PLT 105* 09/04/2015   Lab Results  Component Value Date   INR 1.07 08/26/2015   Lab Results  Component Value Date   NA 134* 09/03/2015   K 3.9 09/03/2015   CL 98* 09/03/2015   CO2 29 09/03/2015   BUN 12 09/03/2015   CREATININE 0.70 09/03/2015   GLUCOSE 116* 09/03/2015    Discharge Medications:     Medication List    STOP taking these medications        aspirin 81 MG tablet     hydroxychloroquine 200 MG tablet  Commonly known as:  PLAQUENIL     methotrexate 2.5 MG tablet  Commonly known as:  RHEUMATREX      TAKE these medications        ALIGN 4 MG Caps  Take 1 capsule by mouth 2 (two) times daily.     ALPRAZolam 0.5 MG tablet  Commonly known as:  XANAX  TAKE TABLET BY MOUTH THREE TIMES DAILY AS NEEDED FOR ANXIETY     Co Q 10 100 MG Caps  Take 1 capsule by mouth  daily.     Cranberry 425 MG Caps  Take 2 capsules by mouth daily.     enoxaparin 30 MG/0.3ML injection  Commonly known as:  LOVENOX  Inject 0.3 mLs (30 mg total) into the skin every 12 (twelve) hours.  Start taking on:  09/05/2015     Fish Oil 1200 MG Caps  Take 2 capsules by mouth daily.     fluticasone 50 MCG/ACT nasal spray  Commonly known as:  FLONASE  Place 2 sprays into both nostrils 2 (two) times daily.     FOLIC ACID PO  Take 829 mcg by mouth daily.     furosemide 20 MG tablet  Commonly known as:  LASIX  Take 20 mg by mouth at bedtime.     gabapentin 300 MG capsule  Commonly known as:  NEURONTIN  TAKE ONE CAPSULE BY MOUTH THREE TIMES DAILY     metoprolol succinate 50 MG 24 hr tablet  Commonly known as:  TOPROL-XL  TAKE 1 TABLET BY MOUTH EVERY DAY     Milk Thistle 250 MG Caps  Take 1 capsule by mouth daily.     multivitamin tablet  Take 1 tablet by mouth daily.     omalizumab 150 MG injection  Commonly known as:  XOLAIR   Inject into the skin every 14 (fourteen) days.     omeprazole 20 MG capsule  Commonly known as:  PRILOSEC  TAKE 1 CAPSULE BY MOUTH EVERY DAY     oxyCODONE 5 MG immediate release tablet  Commonly known as:  Oxy IR/ROXICODONE  Take 1-2 tablets (5-10 mg total) by mouth every 4 (four) hours as needed for severe pain or breakthrough pain.     polyethylene glycol packet  Commonly known as:  MIRALAX / GLYCOLAX  Take 34 g by mouth daily.     pravastatin 20 MG tablet  Commonly known as:  PRAVACHOL  TAKE 1 TABLET BY MOUTH EVERY DAY     Selenium 200 MCG Caps  Take 1 capsule by mouth daily.     solifenacin 5 MG tablet  Commonly known as:  VESICARE  Take 5 mg by mouth at bedtime. For bladder spasms     traMADol 50 MG tablet  Commonly known as:  ULTRAM  Take 1-2 tablets (50-100 mg total) by mouth every 4 (four) hours as needed for moderate pain.     Turmeric 500 MG Caps  Take 500 mg by mouth daily.     vitamin B-12 500 MCG tablet  Commonly known as:  CYANOCOBALAMIN  Take 500 mcg by mouth daily.     Vitamin D3 5000 UNITS Caps  Take 1 capsule by mouth daily.        Diagnostic Studies: Dg Knee Right Port  09/02/2015   CLINICAL DATA:  Status post total knee replacement.  EXAM: PORTABLE RIGHT KNEE - 1-2 VIEW  COMPARISON:  None.  FINDINGS: The patient has undergone total knee replacement. Satisfactory femoral and tibial components. Surgical drain good position. Small effusion.  IMPRESSION: Satisfactory total knee replacement.   Electronically Signed   By: Staci Righter M.D.   On: 09/02/2015 16:11    Disposition: 01-Home or Self Care      Discharge Instructions    Diet - low sodium heart healthy    Complete by:  As directed      Increase activity slowly    Complete by:  As directed            Follow-up Information  Follow up with Feliberto Gottron, PA-C On 09/17/2015.   Specialties:  Orthopedic Surgery, Emergency Medicine   Why:  at 10:45am   Contact  information:   [place] [address] [phone]       Follow up with [name], MD On 10/13/2015.   Specialty:  Orthopedic Surgery   Why:  at 9:15am       Signed: WOLFE,JON R. 09/04/2015, 7:37 AM

## 2015-09-04 NOTE — Progress Notes (Signed)
Physical Therapy Treatment Patient Details Name: [name] MRN: [id number] DOB: 01-14-1934 Today's Date: 09/04/2015    History of Present Illness 81 year old male who came to [facility] for a R TKR; pt also with history of L TKR.    PT Comments    Pt progressing gradually with functional mobility using RW (1 assist plus chair follow).  Pt still requires significant assist to stand (complicated d/t R knee pain and R KI).  Pt requires vc's for upright posture with RW with distance d/t fatigue.  Pt still requires R KI d/t R quad weakness.  Plan for pt to discharge to STR.   Follow Up Recommendations  SNF     Equipment Recommendations       Recommendations for Other Services       Precautions / Restrictions Precautions Precautions: Fall Required Braces or Orthoses: Knee Immobilizer - Right Knee Immobilizer - Right: Discontinue once straight leg raise with < 10 degree lag Restrictions Weight Bearing Restrictions: Yes RLE Weight Bearing: Weight bearing as tolerated    Mobility  Bed Mobility            Transfers Overall transfer level: Needs assistance Equipment used: Rolling walker (2 wheeled) Transfers: Sit to/from [employer] Transfers Sit to Stand: Max assist;+2 safety/equipment (from recliner and from bed) Stand pivot transfers: Min assist;+2 safety/equipment;Mod assist (bed to commode transfer)       General transfer comment: vc's required for hand and feet placement and technique; R KI donned; increased assist and time to stand required  Ambulation/Gait Ambulation/Gait assistance: Min assist;+2 safety/equipment (chair to follow) Ambulation Distance (Feet): 50 Feet Assistive device: Rolling walker (2 wheeled)   Gait velocity: decreased   General Gait Details: step to gait; antalgic; decreased stance time R LE; vc's required for walker advancement and gait technique; R KI donned; limited distance d/t fatigue and c/o R knee pain; vc's required for upright  posture   Stairs            Wheelchair Mobility    Modified Rankin (Stroke Patients Only)       Balance Overall balance assessment: Needs assistance Sitting-balance support: Bilateral upper extremity supported;Feet supported Sitting balance-[name]: Fair   Postural control: Left lateral lean (d/t R knee pain) Standing balance support: Bilateral upper extremity supported (on RW) Standing balance-[name]: Fair                      Cognition Arousal/Alertness: Awake/alert Behavior During Therapy: Anxious Overall Cognitive Status: Within Functional Limits for tasks assessed       Memory: Decreased recall of precautions              Exercises     General Comments Nursing cleared pt for participation in physical therapy.  Pt agreeable to PT session.  Pt requesting to use commode end of session.      Pertinent Vitals/Pain Pain Assessment: 0-10 Pain Score: 7  Pain Location: R knee Pain Descriptors / Indicators: Constant;Sore;Tender Pain Intervention(s): Limited activity within patient's tolerance;Monitored during session;Premedicated before session;Repositioned  Vitals stable and WFL throughout treatment session.    Home Living                      Prior Function            PT Goals (current goals can now be found in the care plan section) Acute Rehab PT Goals Patient Stated Goal: To go to rehab PT Goal  Formulation: With patient Time For Goal Achievement: 09/17/15 Potential to Achieve Goals: Good Progress towards PT goals: Progressing toward goals    Frequency  BID    PT Plan Current plan remains appropriate    Co-evaluation             End of Session Equipment Utilized During Treatment: Gait belt;Right knee immobilizer Activity Tolerance: Patient limited by pain;Patient limited by fatigue Patient left:  (on bedside commode with NA present)     Time: 3559-7416 PT Time Calculation (min) (ACUTE ONLY): 32  min  Charges:  $Gait Training: 8-22 mins $Therapeutic Activity: 8-22 mins                    G CodesLeitha Bleak Sep 24, 2015, 2:01 PM Leitha Bleak, [place]

## 2015-09-04 NOTE — Progress Notes (Signed)
Physical Therapy Treatment Patient Details Name: [name] MRN: [id number] DOB: [birth year] Today's Date: 09/04/2015    History of Present Illness Pt is a 81 y.o. male s/p R TKA secondary to degenerative arthrosis superimposed on RA.  Pt with h/o neurogenic bladder and also L TKA September 2015.    PT Comments    Pt walked 40 feet with RW and 2 assist again this morning with R KI (limited distance d/t fatigue and R knee pain).  Pt's R knee flexion improving well (to 85 degrees this morning) but gradually improving with R knee extension (to 18 degrees short of neutral this morning).  Pt appearing anxious with activities requiring extra time to perform therapy activities.  Continue to recommend pt discharge to STR when medically appropriate.   Follow Up Recommendations  SNF     Equipment Recommendations       Recommendations for Other Services       Precautions / Restrictions Precautions Precautions: Fall Required Braces or Orthoses: Knee Immobilizer - Right Knee Immobilizer - Right: Discontinue once straight leg raise with < 10 degree lag Restrictions Weight Bearing Restrictions: Yes RLE Weight Bearing: Weight bearing as tolerated    Mobility  Bed Mobility Overal bed mobility: Needs Assistance Bed Mobility: Supine to Sit     Supine to sit: Mod assist;HOB elevated     General bed mobility comments: assist for trunk and R LE; vc's for technique required  Transfers Overall transfer level: Needs assistance Equipment used: Rolling walker (2 wheeled) Transfers: Sit to/from Stand Sit to Stand: Max assist;+2 safety/equipment         General transfer comment: vc's required for hand and feet placement and technique; R KI donned; increased assist and time to stand required  Ambulation/Gait Ambulation/Gait assistance: Min guard;Min assist;+2 physical assistance;+2 safety/equipment Ambulation Distance (Feet): 40 Feet Assistive device: Rolling walker (2 wheeled)   Gait  velocity: decreased   General Gait Details: step to gait; antalgic; decreased stance time R LE; vc's required for walker advancement and gait technique; R KI donned; limited distance d/t fatigue and c/o R knee pain   Stairs            Wheelchair Mobility    Modified Rankin (Stroke Patients Only)       Balance Overall balance assessment: Needs assistance Sitting-balance support: Bilateral upper extremity supported;Feet supported Sitting balance-[name]: Fair   Postural control: Left lateral lean (d/t R knee pain) Standing balance support: Bilateral upper extremity supported (on RW) Standing balance-[name]: Fair                      Cognition Arousal/Alertness: Awake/alert Behavior During Therapy: Anxious Overall Cognitive Status: Within Functional Limits for tasks assessed       Memory: Decreased recall of precautions              Exercises Total Joint Exercises Goniometric ROM: R knee flexion AAROM 85 degrees in sitting; R knee extension (semi-supine) AAROM 18 degrees short of neutral  Performed semi-supine B LE therapeutic exercise x 10 reps:  Ankle pumps (AROM B LE's); quad sets x3 second holds (AROM B LE's); SAQ's (AAROM R; AROM L); heelslides (AAROM R; AROM L), hip abd/adduction (AAROM R; AROM L), and SLR (AAROM R; AROM L).  Pt required vc's and tactile cues for correct technique with exercises.     General Comments General comments (skin integrity, edema, etc.): upon entering room, bloody drainage noted just distal to R knee in dressing (  pt with recent dressing change); nursing notified  Nursing cleared pt for participation in physical therapy.  Pt agreeable to PT session.      Pertinent Vitals/Pain Pain Assessment: 0-10 Pain Score: 8  Pain Location: R knee Pain Descriptors / Indicators: Sore;Tender;Constant Pain Intervention(s): Limited activity within patient's tolerance;Monitored during session;Premedicated before  session;Repositioned;Patient requesting pain meds-RN notified  Vitals stable and WFL throughout treatment session.    Home Living                      Prior Function            PT Goals (current goals can now be found in the care plan section) Acute Rehab PT Goals Patient Stated Goal: To go to rehab PT Goal Formulation: With patient Time For Goal Achievement: 09/17/15 Potential to Achieve Goals: Good Progress towards PT goals: Progressing toward goals    Frequency  BID    PT Plan Current plan remains appropriate    Co-evaluation             End of Session Equipment Utilized During Treatment: Gait belt;Right knee immobilizer Activity Tolerance: Patient limited by pain;Patient limited by fatigue Patient left: in chair;with call bell/phone within reach;with chair alarm set;with SCD's reapplied (B heels elevated via towel rolls; polar care in place)     Time: 6045-4098 PT Time Calculation (min) (ACUTE ONLY): 54 min  Charges:  $Gait Training: 8-22 mins $Therapeutic Exercise: 23-37 mins $Therapeutic Activity: 8-22 mins                    G CodesLeitha Bleak Sep 18, 2015, 11:00 AM Leitha Bleak, [place]

## 2015-09-04 NOTE — Discharge Summary (Signed)
Physician Discharge Summary  Patient ID: [name] MRN: [id number] DOB/AGE: 1933/12/24 81 y.o.  Admit date: 09/02/2015 Discharge date: 09/04/2015  Admission Diagnoses:  DEGENERATIVE OSTEOARTHRITIS RIGHT KNEE   Discharge Diagnoses: Patient Active Problem List   Diagnosis Date Noted  . Total knee replacement status 09/02/2015  . Presence of indwelling urinary catheter 08/20/2015  . Advanced directives, counseling/discussion 07/28/2014  . Preoperative examination 07/28/2014  . Routine general medical examination at a health care facility 04/09/2012  . History of prostate cancer 04/09/2012  . Venous stasis dermatitis 06/13/2011  . Chronic sinusitis   . CONSTIPATION, CHRONIC 12/07/2010  . Hyperlipemia 12/02/2010  . OSTEOARTHRITIS 12/02/2010  . NEUROGENIC BLADDER 05/24/2010  . IBS 05/05/2010  . PERSONAL HISTORY OF COLONIC POLYPS 05/05/2010  . NEUROPATHY 05/03/2010  . Essential hypertension, benign 03/25/2009  . GAD (generalized anxiety disorder) 01/27/2007  . Coronary atherosclerosis of native coronary artery 01/27/2007  . Allergic asthma 01/27/2007  . GERD 01/27/2007  . ERECTILE DYSFUNCTION, ORGANIC 01/27/2007  . Chronic rheumatic arthritis ([place]) 01/27/2007    Past Medical History  Diagnosis Date  . CAD (coronary artery disease)   . Neurogenic bladder   . Asthma   . Chronic sinusitis   . Diverticulitis   . GERD (gastroesophageal reflux disease)   . Arthritis     rheumatoid  . Cancer [facility])     Prostate  . IBS (irritable bowel syndrome)   . Anxiety   . Neurodermatitis   . ED (erectile dysfunction)   . OSA (obstructive sleep apnea)   . Allergy   . Hypertension   . Depression   . Hx of adenomatous colonic polyps   . History of SIADH   . Hyponatremia   . Chronic kidney disease     permnaent foley catheter  . Anemia      Transfusion: Autovac transfusion given first 6 hours postop   Consultants (if any):   case management for placement  Discharged Condition:  Improved  Hospital Course: [name] is an 81 y.o. male who was admitted 09/02/2015 with a diagnosis of degenerative arthrosis of right knee and went to the operating room on 09/02/2015 and underwent the above named procedures.    Surgeries:Procedure(s): COMPUTER ASSISTED TOTAL KNEE ARTHROPLASTY on 09/02/2015  PRE-OPERATIVE DIAGNOSIS: Degenerative arthrosis of the right knee, primary (superimposed on rheumatoid arthritis)  POST-OPERATIVE DIAGNOSIS: Same  PROCEDURE: Right total knee arthroplasty using computer-assisted navigation  SURGEON: Marciano Sequin. M.D.  ASSISTANT: Vance Peper, PA (present and scrubbed throughout the case, critical for assistance with exposure, retraction, instrumentation, and closure)  ANESTHESIA: spinal  ESTIMATED BLOOD LOSS: 200 mL  FLUIDS REPLACED: 1400 mL of crystalloid  TOURNIQUET TIME: 104 minutes  DRAINS: 2 medium drains to a reinfusion system  SOFT TISSUE RELEASES: Anterior cruciate ligament, posterior cruciate ligament, deep and superficial medial collateral ligament, patellofemoral ligament   IMPLANTS UTILIZED: DePuy PFC Sigma size 4 posterior stabilized femoral component (cemented), size 4 MBT tibial component (cemented), 38 mm 3 peg oval dome patella (cemented), and a 10 mm stabilized rotating platform polyethylene insert.  INDICATIONS FOR SURGERY: [name] is a 81 y.o. year old male with rheumatoid arthritis and a long history of progressive knee pain. X-rays demonstrated severe degenerative changes in tricompartmental fashion. The patient had not seen any significant improvement despite conservative nonsurgical intervention. After discussion of the risks and benefits of surgical intervention, the patient expressed understanding of the risks benefits and agree with plans for total knee arthroplasty.   The risks,  benefits, and alternatives were discussed at length including but not limited to the risks of infection, bleeding, nerve  injury, stiffness, blood clots, the need for revision surgery, cardiopulmonary complications, among others, and they were willing to proceed. Patient tolerated the surgery well. No complications .Patient was taken to PACU where she was stabilized and then transferred to the orthopedic floor Patient tolerated the surgery well. No complications .Patient was taken to PACU where she was stabilized and then transferred to the orthopedic floor.  Patient started on Lovenox 30 q 12 hrs. Foot pumps applied bilaterally at 80 mm hg . Heels elevated off bed with rolled towels. No evidence of DVT. Calves non tender. Negative Homan. Physical therapy started on day #1 for gait training and transfer with OT starting on  day #1 for ADL and assisted devices. Patient has done well with therapy. Ambulated 40  feet upon being discharged.  Patient's IV DC'd on day #1. Hemovac was d/c on day #2. Foley remains in place secondary to chronic condition of a neuropathy condition. Foley in place prior to admission   He was given perioperative antibiotics:  Anti-infectives    Start     Dose/Rate Route Frequency Ordered Stop   09/02/15 1800  ceFAZolin (ANCEF) IVPB 2 g/50 mL premix     2 g 100 mL/hr over 30 Minutes Intravenous Every 6 hours 09/02/15 1716 09/03/15 1300   09/02/15 1015  ceFAZolin (ANCEF) IVPB 2 g/50 mL premix     2 g 100 mL/hr over 30 Minutes Intravenous  Once 09/02/15 1004 09/02/15 1203   09/02/15 1006  ceFAZolin (ANCEF) 2-3 GM-% IVPB SOLR    Comments:  KENNEDY, ASHLEY: cabinet override      09/02/15 1006 09/02/15 2214    .  He was fitted with AV 1 compression foot pump devices, early ambulation, and TED stockings and Lovenox for DVT prophylaxis.  He benefited maximally from the hospital stay and there were no complications.    Recent vital signs:  Filed Vitals:   09/04/15 0729  BP: 142/51  Pulse: 75  Temp: 98.4 F (36.9 C)  Resp: 18    Recent laboratory studies:  Lab Results  Component  Value Date   HGB 8.9* 09/04/2015   HGB 9.8* 09/03/2015   HGB 12.8* 08/26/2015   Lab Results  Component Value Date   WBC 7.3 09/04/2015   PLT 105* 09/04/2015   Lab Results  Component Value Date   INR 1.07 08/26/2015   Lab Results  Component Value Date   NA 134* 09/03/2015   K 3.9 09/03/2015   CL 98* 09/03/2015   CO2 29 09/03/2015   BUN 12 09/03/2015   CREATININE 0.70 09/03/2015   GLUCOSE 116* 09/03/2015    Discharge Medications:     Medication List    STOP taking these medications        aspirin 81 MG tablet     hydroxychloroquine 200 MG tablet  Commonly known as:  PLAQUENIL     methotrexate 2.5 MG tablet  Commonly known as:  RHEUMATREX      TAKE these medications        ALIGN 4 MG Caps  Take 1 capsule by mouth 2 (two) times daily.     ALPRAZolam 0.5 MG tablet  Commonly known as:  XANAX  TAKE TABLET BY MOUTH THREE TIMES DAILY AS NEEDED FOR ANXIETY     Co Q 10 100 MG Caps  Take 1 capsule by mouth daily.     Cranberry  425 MG Caps  Take 2 capsules by mouth daily.     enoxaparin 30 MG/0.3ML injection  Commonly known as:  LOVENOX  Inject 0.3 mLs (30 mg total) into the skin every 12 (twelve) hours.  Start taking on:  09/05/2015     Fish Oil 1200 MG Caps  Take 2 capsules by mouth daily.     fluticasone 50 MCG/ACT nasal spray  Commonly known as:  FLONASE  Place 2 sprays into both nostrils 2 (two) times daily.     FOLIC ACID PO  Take 789 mcg by mouth daily.     furosemide 20 MG tablet  Commonly known as:  LASIX  Take 20 mg by mouth at bedtime.     gabapentin 300 MG capsule  Commonly known as:  NEURONTIN  TAKE ONE CAPSULE BY MOUTH THREE TIMES DAILY     metoprolol succinate 50 MG 24 hr tablet  Commonly known as:  TOPROL-XL  TAKE 1 TABLET BY MOUTH EVERY DAY     Milk Thistle 250 MG Caps  Take 1 capsule by mouth daily.     multivitamin tablet  Take 1 tablet by mouth daily.     omalizumab 150 MG injection  Commonly known as:  XOLAIR  Inject  into the skin every 14 (fourteen) days.     omeprazole 20 MG capsule  Commonly known as:  PRILOSEC  TAKE 1 CAPSULE BY MOUTH EVERY DAY     oxyCODONE 5 MG immediate release tablet  Commonly known as:  Oxy IR/ROXICODONE  Take 1-2 tablets (5-10 mg total) by mouth every 4 (four) hours as needed for severe pain or breakthrough pain.     polyethylene glycol packet  Commonly known as:  MIRALAX / GLYCOLAX  Take 34 g by mouth daily.     pravastatin 20 MG tablet  Commonly known as:  PRAVACHOL  TAKE 1 TABLET BY MOUTH EVERY DAY     Selenium 200 MCG Caps  Take 1 capsule by mouth daily.     solifenacin 5 MG tablet  Commonly known as:  VESICARE  Take 5 mg by mouth at bedtime. For bladder spasms     traMADol 50 MG tablet  Commonly known as:  ULTRAM  Take 1-2 tablets (50-100 mg total) by mouth every 4 (four) hours as needed for moderate pain.     Turmeric 500 MG Caps  Take 500 mg by mouth daily.     vitamin B-12 500 MCG tablet  Commonly known as:  CYANOCOBALAMIN  Take 500 mcg by mouth daily.     Vitamin D3 5000 UNITS Caps  Take 1 capsule by mouth daily.        Diagnostic Studies: Dg Knee Right Port  09/02/2015   CLINICAL DATA:  Status post total knee replacement.  EXAM: PORTABLE RIGHT KNEE - 1-2 VIEW  COMPARISON:  None.  FINDINGS: The patient has undergone total knee replacement. Satisfactory femoral and tibial components. Surgical drain good position. Small effusion.  IMPRESSION: Satisfactory total knee replacement.   Electronically Signed   By: Staci Righter M.D.   On: 09/02/2015 16:11    Disposition: 01-Home or Self Care      Discharge Instructions    Diet - low sodium heart healthy    Complete by:  As directed      Diet - low sodium heart healthy    Complete by:  As directed      Increase activity slowly    Complete by:  As directed  Increase activity slowly    Complete by:  As directed            Follow-up Information    Follow up with Feliberto Gottron, PA-C On 09/17/2015.   Specialties:  Orthopedic Surgery, Emergency Medicine   Why:  at 10:45am   Contact information:   [place] [address] [phone]       Follow up with [name], MD On 10/13/2015.   Specialty:  Orthopedic Surgery   Why:  at 9:15am       Signed: Cassity Christian R. 09/04/2015, 7:45 AM

## 2015-09-04 NOTE — Care Management Important Message (Signed)
Important Message  Patient Details  Name: [name] MRN: [id number] Date of Birth: July 29, 1934   Medicare Important Message Given:  Yes-second notification given    Darrow Barreiro, RN 09/04/2015, 8:22 AM

## 2015-09-04 NOTE — Progress Notes (Signed)
Plan is for patient to D/C to [facility] Saturday 09/05/15. [employer] authorization has been received. Per Kim admissions coordinator at [facility] patient is going to room 204-B. RN will call report at [phone]. Clinical Education officer, museum (CSW) sent D/C summary to [employer] today. Patient signed [facility] consent form. CSW will continue to follow and assist as needed.   Blima Rich, [place] [phone]

## 2015-09-04 NOTE — Progress Notes (Signed)
   Subjective: 2 Days Post-Op Procedure(s) (LRB): COMPUTER ASSISTED TOTAL KNEE ARTHROPLASTY (Right) Patient reports pain as 8 on 0-10 scale.   Patient is well, and has had no acute complaints or problems Continue with physical therapy today.  Plan is to go Rehab after hospital stay. no nausea and no vomiting Patient denies any chest pains or shortness of breath. Patient admits to feeling better but still having quite a bit of discomfort Objective: Vital signs in last 24 hours: Temp:  [98 F (36.7 C)-99.5 F (37.5 C)] 98.4 F (36.9 C) (10/07 0729) Pulse Rate:  [70-88] 75 (10/07 0729) Resp:  [18] 18 (10/07 0729) BP: (105-144)/(41-51) 142/51 mmHg (10/07 0729) SpO2:  [91 %-97 %] 97 % (10/07 0729) well approximated incision Heels are non tender and elevated off the bed using rolled towels Intake/Output from previous day: 10/06 0701 - 10/07 0700 In: 360 [P.O.:360] Out: 2410 [Urine:2250; Drains:160] Intake/Output this shift:     Recent Labs  09/03/15 0453 09/04/15 0506  HGB 9.8* 8.9*    Recent Labs  09/03/15 0453 09/04/15 0506  WBC 6.5 7.3  RBC 3.09* 2.82*  HCT 28.6* 25.7*  PLT 110* 105*    Recent Labs  09/03/15 0453  NA 134*  K 3.9  CL 98*  CO2 29  BUN 12  CREATININE 0.70  GLUCOSE 116*  CALCIUM 7.6*   No results for input(s): LABPT, INR in the last 72 hours.  EXAM General - Patient is Alert, Appropriate and Oriented Extremity - Neurologically intact Neurovascular intact Sensation intact distally Intact pulses distally Dorsiflexion/Plantar flexion intact Dressing - Significant amount of bleeding to the distal dressing region. Motor Function - intact, moving foot and toes well on exam. Still unable to do a straight leg raise  Past Medical History  Diagnosis Date  . CAD (coronary artery disease)   . Neurogenic bladder   . Asthma   . Chronic sinusitis   . Diverticulitis   . GERD (gastroesophageal reflux disease)   . Arthritis     rheumatoid  .  Cancer [facility])     Prostate  . IBS (irritable bowel syndrome)   . Anxiety   . Neurodermatitis   . ED (erectile dysfunction)   . OSA (obstructive sleep apnea)   . Allergy   . Hypertension   . Depression   . Hx of adenomatous colonic polyps   . History of SIADH   . Hyponatremia   . Chronic kidney disease     permnaent foley catheter  . Anemia     Assessment/Plan: 2 Days Post-Op Procedure(s) (LRB): COMPUTER ASSISTED TOTAL KNEE ARTHROPLASTY (Right) Active Problems:   Total knee replacement status  Estimated body mass index is 31.39 kg/(m^2) as calculated from the following:   Height as of 08/26/15: 5' 11.5" (1.816 m).   Weight as of 03/31/15: 103.511 kg (228 lb 3.2 oz). Up with therapy Plan for discharge tomorrow Discharge to SNF  Labs: Were reviewed. Sodium slightly low. DVT Prophylaxis - Lovenox, Foot Pumps and TED hose Weight-Bearing as tolerated to right leg Hemovac was discontinued on today's visit Dressing changes on today's visit to a new honeycombed.   Jillyn Ledger. Luxora Crugers 09/04/2015, 7:30 AM

## 2015-09-04 NOTE — Progress Notes (Signed)
Occupational Therapy Treatment Patient Details Name: [name] MRN: [id number] DOB: 1934-04-18 Today's Date: 09/04/2015    History of present illness 81 year old male who came to [facility] for a L TKR with history of R TKR.   OT comments  Patient declined actual dressing but willing to review.  Follow Up Recommendations  SNF    Equipment Recommendations       Recommendations for Other Services      Precautions / Restrictions Precautions Precautions: Fall Required Braces or Orthoses: Knee Immobilizer - Right Knee Immobilizer - Right: Discontinue once straight leg raise with < 10 degree lag Restrictions Weight Bearing Restrictions: Yes RLE Weight Bearing: Weight bearing as tolerated       Mobility Bed Mobility Overal bed mobility: Needs Assistance Bed Mobility: Supine to Sit     Supine to sit: Mod assist;HOB elevated     General bed mobility comments: assist for trunk and R LE; vc's for technique required  Transfers Overall transfer level: Needs assistance Equipment used: Rolling walker (2 wheeled) Transfers: Sit to/from Stand Sit to Stand: Max assist;+2 safety/equipment         General transfer comment: vc's required for hand and feet placement and technique; R KI donned; increased assist and time to stand required    Balance Overall balance assessment: Needs assistance Sitting-balance support: Bilateral upper extremity supported;Feet supported Sitting balance-[name]: Fair   Postural control: Left lateral lean (d/t R knee pain) Standing balance support: Bilateral upper extremity supported (on RW) Standing balance-[name]: Fair                     ADL                                         General ADL Comments: .Patient declined actual dressing but willing to review techniques for lower body dressing regarding reacher for pants and removal of socks and sock aid and long shoe horn. Also reviewed long handled sponge.        Vision                     Perception     Praxis      Cognition   Behavior During Therapy: Anxious Overall Cognitive Status: Within Functional Limits for tasks assessed       Memory: Decreased recall of precautions               Extremity/Trunk Assessment               Exercises Total Joint Exercises Goniometric ROM: R knee flexion AAROM 85 degrees in sitting; R knee extension (semi-supine) AAROM 18 degrees short of neutral   Shoulder Instructions       General Comments      Pertinent Vitals/ Pain       Pain Assessment: 0-10 Pain Score: 7  Pain Location: R knee Pain Descriptors / Indicators: Sore;Tender;Constant Pain Intervention(s): Limited activity within patient's tolerance;Monitored during session;Premedicated before session;Repositioned;Patient requesting pain meds-RN notified  Home Living                                          Prior Functioning/Environment              Frequency  Progress Toward Goals  OT Goals(current goals can now be found in the care plan section)     Acute Rehab OT Goals Patient Stated Goal: To go to rehab  Plan      Co-evaluation                 End of Session Equipment Utilized During Treatment:  (hip kit)   Activity Tolerance     Patient Left in chair;with call bell/phone within reach;with chair alarm set   Nurse Communication          Time: 7331-2508 OT Time Calculation (min): 10 min  Charges: OT General Charges $OT Visit: 1 Procedure OT Treatments $Self Care/Home Management : 8-22 mins  Arjuna, [place], MS/OTR/L  09/04/2015, 12:00 PM

## 2015-09-05 DIAGNOSIS — N319 Neuromuscular dysfunction of bladder, unspecified: Secondary | ICD-10-CM | POA: Diagnosis not present

## 2015-09-05 DIAGNOSIS — J45901 Unspecified asthma with (acute) exacerbation: Secondary | ICD-10-CM | POA: Diagnosis not present

## 2015-09-05 DIAGNOSIS — E871 Hypo-osmolality and hyponatremia: Secondary | ICD-10-CM | POA: Diagnosis not present

## 2015-09-05 DIAGNOSIS — M6281 Muscle weakness (generalized): Secondary | ICD-10-CM | POA: Diagnosis not present

## 2015-09-05 DIAGNOSIS — D649 Anemia, unspecified: Secondary | ICD-10-CM | POA: Diagnosis present

## 2015-09-05 DIAGNOSIS — M7989 Other specified soft tissue disorders: Secondary | ICD-10-CM | POA: Diagnosis not present

## 2015-09-05 DIAGNOSIS — F329 Major depressive disorder, single episode, unspecified: Secondary | ICD-10-CM | POA: Diagnosis not present

## 2015-09-05 DIAGNOSIS — D62 Acute posthemorrhagic anemia: Secondary | ICD-10-CM | POA: Diagnosis present

## 2015-09-05 DIAGNOSIS — Z96651 Presence of right artificial knee joint: Secondary | ICD-10-CM | POA: Diagnosis not present

## 2015-09-05 DIAGNOSIS — F419 Anxiety disorder, unspecified: Secondary | ICD-10-CM | POA: Diagnosis not present

## 2015-09-05 DIAGNOSIS — I1310 Hypertensive heart and chronic kidney disease without heart failure, with stage 1 through stage 4 chronic kidney disease, or unspecified chronic kidney disease: Secondary | ICD-10-CM | POA: Diagnosis not present

## 2015-09-05 DIAGNOSIS — K589 Irritable bowel syndrome without diarrhea: Secondary | ICD-10-CM | POA: Diagnosis not present

## 2015-09-05 DIAGNOSIS — R58 Hemorrhage, not elsewhere classified: Secondary | ICD-10-CM | POA: Diagnosis not present

## 2015-09-05 DIAGNOSIS — G4733 Obstructive sleep apnea (adult) (pediatric): Secondary | ICD-10-CM | POA: Diagnosis not present

## 2015-09-05 DIAGNOSIS — Z471 Aftercare following joint replacement surgery: Secondary | ICD-10-CM | POA: Diagnosis not present

## 2015-09-05 DIAGNOSIS — I10 Essential (primary) hypertension: Secondary | ICD-10-CM | POA: Diagnosis not present

## 2015-09-05 DIAGNOSIS — Z888 Allergy status to other drugs, medicaments and biological substances status: Secondary | ICD-10-CM | POA: Diagnosis not present

## 2015-09-05 DIAGNOSIS — Z8249 Family history of ischemic heart disease and other diseases of the circulatory system: Secondary | ICD-10-CM | POA: Diagnosis not present

## 2015-09-05 DIAGNOSIS — Z8489 Family history of other specified conditions: Secondary | ICD-10-CM | POA: Diagnosis not present

## 2015-09-05 DIAGNOSIS — Z9079 Acquired absence of other genital organ(s): Secondary | ICD-10-CM | POA: Diagnosis not present

## 2015-09-05 DIAGNOSIS — J45909 Unspecified asthma, uncomplicated: Secondary | ICD-10-CM | POA: Diagnosis not present

## 2015-09-05 DIAGNOSIS — K219 Gastro-esophageal reflux disease without esophagitis: Secondary | ICD-10-CM | POA: Diagnosis not present

## 2015-09-05 DIAGNOSIS — R52 Pain, unspecified: Secondary | ICD-10-CM | POA: Diagnosis not present

## 2015-09-05 DIAGNOSIS — M159 Polyosteoarthritis, unspecified: Secondary | ICD-10-CM | POA: Diagnosis not present

## 2015-09-05 DIAGNOSIS — R600 Localized edema: Secondary | ICD-10-CM | POA: Diagnosis not present

## 2015-09-05 DIAGNOSIS — N189 Chronic kidney disease, unspecified: Secondary | ICD-10-CM | POA: Diagnosis not present

## 2015-09-05 DIAGNOSIS — Z881 Allergy status to other antibiotic agents status: Secondary | ICD-10-CM | POA: Diagnosis not present

## 2015-09-05 DIAGNOSIS — M179 Osteoarthritis of knee, unspecified: Secondary | ICD-10-CM | POA: Diagnosis not present

## 2015-09-05 DIAGNOSIS — Z7901 Long term (current) use of anticoagulants: Secondary | ICD-10-CM | POA: Diagnosis not present

## 2015-09-05 DIAGNOSIS — Z86010 Personal history of colonic polyps: Secondary | ICD-10-CM | POA: Diagnosis not present

## 2015-09-05 DIAGNOSIS — Z79891 Long term (current) use of opiate analgesic: Secondary | ICD-10-CM | POA: Diagnosis not present

## 2015-09-05 DIAGNOSIS — Z7951 Long term (current) use of inhaled steroids: Secondary | ICD-10-CM | POA: Diagnosis not present

## 2015-09-05 DIAGNOSIS — I129 Hypertensive chronic kidney disease with stage 1 through stage 4 chronic kidney disease, or unspecified chronic kidney disease: Secondary | ICD-10-CM | POA: Diagnosis not present

## 2015-09-05 DIAGNOSIS — I2510 Atherosclerotic heart disease of native coronary artery without angina pectoris: Secondary | ICD-10-CM | POA: Diagnosis not present

## 2015-09-05 DIAGNOSIS — Z79899 Other long term (current) drug therapy: Secondary | ICD-10-CM | POA: Diagnosis not present

## 2015-09-05 DIAGNOSIS — M96830 Postprocedural hemorrhage and hematoma of a musculoskeletal structure following a musculoskeletal system procedure: Secondary | ICD-10-CM | POA: Diagnosis present

## 2015-09-05 DIAGNOSIS — M069 Rheumatoid arthritis, unspecified: Secondary | ICD-10-CM | POA: Diagnosis not present

## 2015-09-05 LAB — CBC
HCT: 23.5 % — ABNORMAL LOW (ref 40.0–52.0)
HEMOGLOBIN: 8.2 g/dL — AB (ref 13.0–18.0)
MCH: 31.6 pg (ref 26.0–34.0)
MCHC: 34.8 g/dL (ref 32.0–36.0)
MCV: 90.9 fL (ref 80.0–100.0)
Platelets: 139 10*3/uL — ABNORMAL LOW (ref 150–440)
RBC: 2.59 MIL/uL — ABNORMAL LOW (ref 4.40–5.90)
RDW: 16 % — ABNORMAL HIGH (ref 11.5–14.5)
WBC: 8 10*3/uL (ref 3.8–10.6)

## 2015-09-05 NOTE — Progress Notes (Signed)
Okay for transfer to [facility], report called to Wel[place] ,  [place] will be transported with knee immobilizer to rt knee, and foley which is chronic

## 2015-09-05 NOTE — Progress Notes (Signed)
  Subjective: 3 Days Post-Op Procedure(s) (LRB): COMPUTER ASSISTED TOTAL KNEE ARTHROPLASTY (Right) Patient reports pain as mild.   Patient seen in rounds with Dr. [name]. Patient is well, but has had some minor complaints of Leakage around his Foley catheter, which has happened previously. The patient has spasms which causes leakage. Plan is to go Rehab after hospital stay. Negative for chest pain and shortness of breath Fever: no Gastrointestinal: Negative for nausea and vomiting. The patient had a bowel movement in the evening.  Objective: Vital signs in last 24 hours: Temp:  [97.5 F (36.4 C)-98.9 F (37.2 C)] 98.9 F (37.2 C) (10/08 0409) Pulse Rate:  [75-78] 77 (10/08 0409) Resp:  [16-18] 18 (10/08 0409) BP: (116-142)/(42-53) 136/43 mmHg (10/08 0409) SpO2:  [90 %-99 %] 99 % (10/08 0409)  Intake/Output from previous day:  Intake/Output Summary (Last 24 hours) at 09/05/15 0631 Last data filed at 09/05/15 0327  Gross per 24 hour  Intake    360 ml  Output   1850 ml  Net  -1490 ml    Intake/Output this shift: Total I/O In: -  Out: 700 [Urine:700]  Labs:  Recent Labs  09/03/15 0453 09/04/15 0506 09/05/15 0350  HGB 9.8* 8.9* 8.2*    Recent Labs  09/04/15 0506 09/05/15 0350  WBC 7.3 8.0  RBC 2.82* 2.59*  HCT 25.7* 23.5*  PLT 105* 139*    Recent Labs  09/03/15 0453  NA 134*  K 3.9  CL 98*  CO2 29  BUN 12  CREATININE 0.70  GLUCOSE 116*  CALCIUM 7.6*   No results for input(s): LABPT, INR in the last 72 hours.   EXAM General - Patient is Alert and Oriented Extremity - Intact pulses distally Dorsiflexion/Plantar flexion intact No cellulitis present Compartment soft Dressing/Incision - clean, Mild bloody drainage at the distal portion of the incision. A new honeycomb dressing was applied with the support stockings.  Motor Function - intact, moving foot and toes well on exam. The patient ambulated 50 feet with physical therapy.  Past Medical  History  Diagnosis Date  . CAD (coronary artery disease)   . Neurogenic bladder   . Asthma   . Chronic sinusitis   . Diverticulitis   . GERD (gastroesophageal reflux disease)   . Arthritis     rheumatoid  . Cancer [facility])     Prostate  . IBS (irritable bowel syndrome)   . Anxiety   . Neurodermatitis   . ED (erectile dysfunction)   . OSA (obstructive sleep apnea)   . Allergy   . Hypertension   . Depression   . Hx of adenomatous colonic polyps   . History of SIADH   . Hyponatremia   . Chronic kidney disease     permnaent foley catheter  . Anemia     Assessment/Plan: 3 Days Post-Op Procedure(s) (LRB): COMPUTER ASSISTED TOTAL KNEE ARTHROPLASTY (Right) Active Problems:   Total knee replacement status  Estimated body mass index is 31.39 kg/(m^2) as calculated from the following:   Height as of 08/26/15: 5' 11.5" (1.816 m).   Weight as of 03/31/15: 103.511 kg (228 lb 3.2 oz). Discharge to SNF  DVT Prophylaxis - Lovenox, Foot Pumps and TED hose Weight-Bearing as tolerated to right leg  Reche Dixon, PA-C Orthopaedic Surgery 09/05/2015, 6:31 AM

## 2015-09-05 NOTE — Clinical Social Work Placement (Signed)
   CLINICAL SOCIAL WORK PLACEMENT  NOTE  Date:  09/05/2015  Patient Details  Name: [name] MRN: [id number] Date of Birth: 12-27-1933  Clinical Social Work is seeking post-discharge placement for this patient at the [place] level of care (*CSW will initial, date and re-position this form in  chart as items are completed):  Yes   Patient/family provided with [place] Work Department's list of facilities offering this level of care within the geographic area requested by the patient (or if unable, by the patient's family).  Yes   Patient/family informed of their freedom to choose among providers that offer the needed level of care, that participate in Medicare, Medicaid or managed care program needed by the patient, have an available bed and are willing to accept the patient.  Yes   Patient/family informed of [facility]'s ownership interest in [facility] and [facility], as well as of the fact that they are under no obligation to receive care at these facilities.  PASRR submitted to EDS on       PASRR number received on       Existing PASRR number confirmed on 09/03/15     FL2 transmitted to all facilities in geographic area requested by pt/family on 09/03/15     FL2 transmitted to all facilities within larger geographic area on       Patient informed that his/her managed care company has contracts with or will negotiate with certain facilities, including the following:        Yes   Patient/family informed of bed offers received.  Patient chooses bed at  [facility] )     Physician recommends and patient chooses bed at      Patient to be transferred to  [facility] ) on 09/05/15.  Patient to be transferred to facility by  [facility] )     Patient family notified on 09/05/15 of transfer.  Name of family member notified:   (Patient's daughter is aware of D/C. )     PHYSICIAN       Additional Comment:     _______________________________________________ Loralyn Freshwater, LCSW 09/05/2015, 9:12 AM

## 2015-09-05 NOTE — Plan of Care (Signed)
Problem: Phase I Progression Outcomes Goal: Initial discharge plan identified Outcome: Completed/Met Date Met:  09/05/15 Pt has bed at [facility]  Problem: Phase III Progression Outcomes Goal: Pain controlled on oral analgesia Outcome: Completed/Met Date Met:  09/05/15 Pain control with oral pain medication Goal: Ambulates Outcome: Progressing Max assist to bedside commode this shift Goal: Incision clean - minimal/no drainage Outcome: Completed/Met Date Met:  09/05/15 Dressing dry and intact.

## 2015-09-05 NOTE — Progress Notes (Signed)
Dr [name] on unit rounding and informed of pt progress and dressing change

## 2015-09-05 NOTE — Progress Notes (Signed)
Pt transported to [facility] via EMS with daughter [name] present , belongings transported with pt by EMS daughter took walker

## 2015-09-05 NOTE — Progress Notes (Signed)
Pt assisted to bedside commode 2 max assist had very large normal stool,  Pt dressing to rt knee  became saturated with dark red drainage during activity ,  Dressing changed and ace wrap applied

## 2015-09-05 NOTE — Progress Notes (Addendum)
Patient is medically stable for D/C to [facility] today. Per Kim admissions coordinator at [facility] patient is going to room 204-B. RN will call report at [phone] and arrange EMS for transport. Clinical Education officer, museum (CSW) prepared D/C packet. D/C Summary was sent Friday. [employer] authorization was received Friday. CSW spoke with RN Benjamine Mola at [place] today who reported that patient can come. Patient is aware of above. Per patient his family is aware of D/C today. Patient's daughter was at bedside on Friday when D/C arrangements were made. Please reconsult if future social work needs arise. CSW signing off.   Blima Rich, [place] [phone]

## 2015-09-05 NOTE — Progress Notes (Signed)
Physical Therapy Treatment Patient Details Name: [name] MRN: [id number] DOB: 1934/07/03 Today's Date: 09/05/2015    History of Present Illness 81 year old male who came to [facility] for a R TKR; pt also with history of L TKR 1ya.     PT Comments    Pt motivated to participate in therapy session today, however self-limits activity to worsening fatigue, citing a sleepless night; of note, latest labs indicating Hb: 8.2, HCT:23.5, both of which have continuously dropped over past 2 days. Pt denies dizziness or lightheadedness upon sitting EOB. Pt ROM remains largely unchanged since yesterday. Strength is improving, bud quads-lag remains quite significant. Pt will continue to benefit from skilled intervention to restore to PLOF in mobility and AMB.   Follow Up Recommendations  SNF     Equipment Recommendations       Recommendations for Other Services       Precautions / Restrictions Precautions Precautions: Fall Required Braces or Orthoses: Knee Immobilizer - Right Knee Immobilizer - Right: Discontinue once straight leg raise with < 10 degree lag Restrictions RLE Weight Bearing: Weight bearing as tolerated    Mobility  Bed Mobility Overal bed mobility: Needs Assistance;+2 for physical assistance Bed Mobility: Supine to Sit     Supine to sit: Mod assist;HOB elevated Sit to supine: Mod assist;+2 for physical assistance   General bed mobility comments: assist for trunk and R LE; vc's for technique required  Transfers                 General transfer comment: Pt refused today, reporting that he feels especially fatigued, and he has not slept well. Would like to save energy for DC to STR later today. Pt agreeable to go to chair for breakfast later this morning.   Ambulation/Gait                 Stairs            Wheelchair Mobility    Modified Rankin (Stroke Patients Only)       Balance Overall balance assessment: Needs assistance Sitting-balance  support: Single extremity supported;Feet supported Sitting balance-[name]: Fair                              Cognition Arousal/Alertness: Awake/alert Behavior During Therapy: WFL for tasks assessed/performed Overall Cognitive Status: Within Functional Limits for tasks assessed       Memory: Decreased recall of precautions              Exercises Total Joint Exercises Ankle Circles/Pumps: AROM;Both;15 reps;Supine Quad Sets: AROM;Right;10 reps;Supine (Quads weakness is significant, performed mostly with glutes. ) Short Arc Quad: AAROM;Right;10 reps;Supine (Very weak, limited quads activity. ) Heel Slides: AAROM;Right;15 reps;Supine Hip ABduction/ADduction: AAROM;Right;15 reps;Supine (Good strength. ) Long Arc Quad: Seated;10 reps;Right;AAROM (minimal quads activation at end-range flexion, but HS are very limiting while seated. ) Goniometric ROM: R knee flexion 18-83 degrees, supine/ seated EOB.     General Comments        Pertinent Vitals/Pain Pain Assessment: 0-10 Pain Score: 8  Pain Location: R knee  Pain Intervention(s): Limited activity within patient's tolerance;Monitored during session;Patient requesting pain meds-RN notified;RN gave pain meds during session;Ice applied    Home Living                      Prior Function            PT Goals (current  goals can now be found in the care plan section) Acute Rehab PT Goals Patient Stated Goal: To go to rehab PT Goal Formulation: With patient Time For Goal Achievement: 09/17/15 Potential to Achieve Goals: Good Progress towards PT goals: Progressing toward goals    Frequency  BID    PT Plan Current plan remains appropriate    Co-evaluation             End of Session   Activity Tolerance: Patient limited by pain;Patient limited by fatigue       Time: 3968-8648 PT Time Calculation (min) (ACUTE ONLY): 35 min  Charges:  $Therapeutic Exercise: 8-22 mins $Therapeutic Activity:  8-22 mins                    G Codes:      Darroll Bredeson C Sep 29, 2015, 7:36 AM  7:38 AM  Etta Grandchild, PT, DPT [place] License # [id number]

## 2015-09-06 ENCOUNTER — Emergency Department
Admission: EM | Admit: 2015-09-06 | Discharge: 2015-09-06 | Disposition: A | Discharge status: Home / Self Care | Payer: Medicare PPO | Attending: Emergency Medicine | Admitting: Emergency Medicine

## 2015-09-06 ENCOUNTER — Encounter: Payer: Self-pay | Admitting: Emergency Medicine

## 2015-09-06 DIAGNOSIS — Z79899 Other long term (current) drug therapy: Secondary | ICD-10-CM | POA: Diagnosis not present

## 2015-09-06 DIAGNOSIS — Z7951 Long term (current) use of inhaled steroids: Secondary | ICD-10-CM | POA: Diagnosis not present

## 2015-09-06 DIAGNOSIS — I129 Hypertensive chronic kidney disease with stage 1 through stage 4 chronic kidney disease, or unspecified chronic kidney disease: Secondary | ICD-10-CM | POA: Insufficient documentation

## 2015-09-06 DIAGNOSIS — M96830 Postprocedural hemorrhage and hematoma of a musculoskeletal structure following a musculoskeletal system procedure: Secondary | ICD-10-CM | POA: Insufficient documentation

## 2015-09-06 DIAGNOSIS — N189 Chronic kidney disease, unspecified: Secondary | ICD-10-CM | POA: Diagnosis not present

## 2015-09-06 LAB — CBC WITH DIFFERENTIAL/PLATELET
Basophils Absolute: 0 10*3/uL (ref 0–0.1)
Basophils Relative: 1 %
Eosinophils Absolute: 0.4 10*3/uL (ref 0–0.7)
Eosinophils Relative: 4 %
HEMATOCRIT: 21.5 % — AB (ref 40.0–52.0)
HEMOGLOBIN: 7.4 g/dL — AB (ref 13.0–18.0)
LYMPHS ABS: 0.7 10*3/uL — AB (ref 1.0–3.6)
Lymphocytes Relative: 9 %
MCH: 31.5 pg (ref 26.0–34.0)
MCHC: 34.2 g/dL (ref 32.0–36.0)
MCV: 92 fL (ref 80.0–100.0)
MONOS PCT: 11 %
Monocytes Absolute: 0.9 10*3/uL (ref 0.2–1.0)
NEUTROS ABS: 6.1 10*3/uL (ref 1.4–6.5)
NEUTROS PCT: 75 %
Platelets: 162 10*3/uL (ref 150–440)
RBC: 2.34 MIL/uL — AB (ref 4.40–5.90)
RDW: 15.8 % — ABNORMAL HIGH (ref 11.5–14.5)
WBC: 8.1 10*3/uL (ref 3.8–10.6)

## 2015-09-06 LAB — BASIC METABOLIC PANEL
Anion gap: 8 (ref 5–15)
BUN: 15 mg/dL (ref 6–20)
CHLORIDE: 89 mmol/L — AB (ref 101–111)
CO2: 32 mmol/L (ref 22–32)
CREATININE: 0.76 mg/dL (ref 0.61–1.24)
Calcium: 8 mg/dL — ABNORMAL LOW (ref 8.9–10.3)
GFR calc non Af Amer: >60 mL/min (ref >60)
Glucose, Bld: 120 mg/dL — ABNORMAL HIGH (ref 65–99)
POTASSIUM: 3.7 mmol/L (ref 3.5–5.1)
SODIUM: 129 mmol/L — AB (ref 135–145)

## 2015-09-06 MED ORDER — OXYCODONE-ACETAMINOPHEN 5-325 MG PO TABS
1.0000 | ORAL_TABLET | Freq: Once | ORAL | Status: AC
  Administered 2015-09-06: 1 via ORAL
  Filled 2015-09-06: qty 1

## 2015-09-06 NOTE — ED Notes (Signed)
Report given to Bill, RN.

## 2015-09-06 NOTE — ED Provider Notes (Signed)
Orthopedics comes to see patient will I am talking with someone else. He supplies a handwritten list of instructions 108 extremity due to no PT with the exception gentle ambulation for 2 days 3 a day for the patient ambulate for presumed PTO today for gentle range of motion 5 SCDs operative extremities 6 do not remove dressing until Thursday 7 keep me compressed 8:30 milligrams Lovenox twice a day. I will transcribe these on to the patient's discharge instructions and discharge him as per orthopedic consultant  Nena Polio, MD 09/06/15 1002

## 2015-09-06 NOTE — ED Notes (Signed)
Pt arrives via EMS from [place]. Pt had right knee replacement on 10/5. Pt sent due to increased bleeding at incision site. Per staff, pt's dressing has been changed 3 times in 12 hours. Pt c/o right knee pain at this time. NAD noted. RR even and nonlabored. Denies fever or other symptoms.

## 2015-09-06 NOTE — ED Provider Notes (Signed)
[facility] Emergency Department Provider Note  ____________________________________________  Time seen: Approximately 4:03 AM  I have reviewed the triage vital signs and the nursing notes.   HISTORY  Chief Complaint Post-op Problem    HPI [name] is a 81 y.o. male who presents to the ED via EMS from [facility] for a chief complaint of postoperative bleeding. Patient had right total knee replacement by Dr. [name] on 10/5.He was discharged on 10/8 to the rehabilitation facility. Patient states his drain was removed after 2 days. States he was bleeding prior to discharge; was seen by orthopedist who felt wound looked good without active bleeding. Daughter states bleeding increased after patient was moved to rehabilitation. Per nursing staff, they have changed patient's dressing 3 times in the past 12 hours. Patient denies fever, chills, chest pain, shortness of breath, abdominal pain, vomiting, diarrhea, increased pain. States he did have some nausea in the ambulance ride prior to arrival which has since resolved. Nothing makes the bleeding better or worse. Patient does receive subcutaneous injections of Lovenox.   Past Medical History  Diagnosis Date  . CAD (coronary artery disease)   . Neurogenic bladder   . Asthma   . Chronic sinusitis   . Diverticulitis   . GERD (gastroesophageal reflux disease)   . Arthritis     rheumatoid  . Cancer [facility])     Prostate  . IBS (irritable bowel syndrome)   . Anxiety   . Neurodermatitis   . ED (erectile dysfunction)   . OSA (obstructive sleep apnea)   . Allergy   . Hypertension   . Depression   . Hx of adenomatous colonic polyps   . History of SIADH   . Hyponatremia   . Chronic kidney disease     permnaent foley catheter  . Anemia     Patient Active Problem List   Diagnosis Date Noted  . Total knee replacement status 09/02/2015  . Presence of indwelling urinary catheter 08/20/2015  . Advanced directives,  counseling/discussion 07/28/2014  . Preoperative examination 07/28/2014  . Routine general medical examination at a health care facility 04/09/2012  . History of prostate cancer 04/09/2012  . Venous stasis dermatitis 06/13/2011  . Chronic sinusitis   . CONSTIPATION, CHRONIC 12/07/2010  . Hyperlipemia 12/02/2010  . OSTEOARTHRITIS 12/02/2010  . NEUROGENIC BLADDER 05/24/2010  . IBS 05/05/2010  . PERSONAL HISTORY OF COLONIC POLYPS 05/05/2010  . NEUROPATHY 05/03/2010  . Essential hypertension, benign 03/25/2009  . GAD (generalized anxiety disorder) 01/27/2007  . Coronary atherosclerosis of native coronary artery 01/27/2007  . Allergic asthma 01/27/2007  . GERD 01/27/2007  . ERECTILE DYSFUNCTION, ORGANIC 01/27/2007  . Chronic rheumatic arthritis ([place]) 01/27/2007    Past Surgical History  Procedure Laterality Date  . Prostate surgery      PROSTATECTOMY  . Knee surgery      ARTHROSCOPY LEFT  . Nasal sinus surgery  2009    DEVIATED SEPTUM AND POLYPS  . Urethral stricture dilatation  02-2010    Dr.Cope  . Total knee arthroplasty Left 9/15    Dr [name]  . Joint replacement    . Knee arthroplasty Right 09/02/2015    Procedure: COMPUTER ASSISTED TOTAL KNEE ARTHROPLASTY;  Surgeon: [name], MD;  Location: [facility] ORS;  Service: Orthopedics;  Laterality: Right;    Current Outpatient Rx  Name  Route  Sig  Dispense  Refill  . ALPRAZolam (XANAX) 0.5 MG tablet      TAKE TABLET BY MOUTH THREE TIMES DAILY  AS NEEDED FOR ANXIETY   90 tablet   0   . Cholecalciferol (VITAMIN D3) 5000 UNITS CAPS   Oral   Take 1 capsule by mouth daily.         . Coenzyme Q10 (CO Q 10) 100 MG CAPS   Oral   Take 1 capsule by mouth daily.         . Cranberry 425 MG CAPS   Oral   Take 2 capsules by mouth daily.         Marland Kitchen enoxaparin (LOVENOX) 30 MG/0.3ML injection   Subcutaneous   Inject 0.3 mLs (30 mg total) into the skin every 12 (twelve) hours.   14 Syringe   0   . fluticasone (FLONASE) 50  MCG/ACT nasal spray   Each Nare   Place 2 sprays into both nostrils 2 (two) times daily.         Marland Kitchen FOLIC ACID PO   Oral   Take 400 mcg by mouth daily.          . furosemide (LASIX) 20 MG tablet   Oral   Take 20 mg by mouth at bedtime.         . gabapentin (NEURONTIN) 300 MG capsule      TAKE ONE CAPSULE BY MOUTH THREE TIMES DAILY   90 capsule   3   . metoprolol succinate (TOPROL-XL) 50 MG 24 hr tablet      TAKE 1 TABLET BY MOUTH EVERY DAY   90 tablet   3   . Milk Thistle 250 MG CAPS   Oral   Take 1 capsule by mouth daily.         . Multiple Vitamin (MULTIVITAMIN) tablet   Oral   Take 1 tablet by mouth daily.           Marland Kitchen omalizumab (XOLAIR) 150 MG injection   Subcutaneous   Inject into the skin every 14 (fourteen) days.          . Omega-3 Fatty Acids (FISH OIL) 1200 MG CAPS   Oral   Take 2 capsules by mouth daily.         Marland Kitchen omeprazole (PRILOSEC) 20 MG capsule      TAKE 1 CAPSULE BY MOUTH EVERY DAY   90 capsule   3   . oxyCODONE (OXY IR/ROXICODONE) 5 MG immediate release tablet   Oral   Take 1-2 tablets (5-10 mg total) by mouth every 4 (four) hours as needed for severe pain or breakthrough pain.   30 tablet   0   . polyethylene glycol (GLYCOLAX) packet   Oral   Take 34 g by mouth daily.          . pravastatin (PRAVACHOL) 20 MG tablet      TAKE 1 TABLET BY MOUTH EVERY DAY   90 tablet   3   . Probiotic Product (ALIGN) 4 MG CAPS   Oral   Take 1 capsule by mouth 2 (two) times daily.         . Selenium 200 MCG CAPS   Oral   Take 1 capsule by mouth daily.         . solifenacin (VESICARE) 5 MG tablet   Oral   Take 5 mg by mouth at bedtime. For bladder spasms         . traMADol (ULTRAM) 50 MG tablet   Oral   Take 1-2 tablets (50-100 mg total) by mouth every 4 (four) hours as needed  for moderate pain.   30 tablet   0   . Turmeric 500 MG CAPS   Oral   Take 500 mg by mouth daily.         . vitamin B-12 (CYANOCOBALAMIN) 500  MCG tablet   Oral   Take 500 mcg by mouth daily.           Allergies Ciprofloxacin; Citalopram hydrobromide; Lorazepam; Paroxetine; Ramipril; and Simvastatin  Family History  Problem Relation Age of Onset  . Heart disease Mother 56    heart failure  . Pneumonia Father 71    pnemonia    Social History Social History  Substance Use Topics  . Smoking status: Never Smoker   . Smokeless tobacco: Never Used  . Alcohol Use: Yes     Comment: 4-5 nights/week    Review of Systems Constitutional: No fever/chills Eyes: No visual changes. ENT: No sore throat. Cardiovascular: Denies chest pain. Respiratory: Denies shortness of breath. Gastrointestinal: No abdominal pain.  No nausea, no vomiting.  No diarrhea.  No constipation. Genitourinary: Negative for dysuria. Musculoskeletal: Positive for post operative right knee bleeding. Negative for back pain. Skin: Negative for rash. Neurological: Negative for headaches, focal weakness or numbness.  10-point ROS otherwise negative.  ____________________________________________   PHYSICAL EXAM:  VITAL SIGNS: ED Triage Vitals  Enc Vitals Group     BP 09/06/15 0350 148/50 mmHg     Pulse Rate 09/06/15 0350 83     Resp 09/06/15 0350 18     Temp 09/06/15 0350 98.5 F (36.9 C)     Temp Source 09/06/15 0350 Oral     SpO2 09/06/15 0350 96 %     Weight 09/06/15 0350 231 lb (104.781 kg)     Height 09/06/15 0350 5\' 11"  (1.803 m)     Head Cir --      Peak Flow --      Pain Score 09/06/15 0351 6     Pain Loc --      Pain Edu? --      Excl. in [place]? --     Constitutional: Alert and oriented. Well appearing and in no acute distress. Eyes: Conjunctivae are normal. PERRL. EOMI. Head: Atraumatic. Nose: No congestion/rhinnorhea. Mouth/Throat: Mucous membranes are moist.  Oropharynx non-erythematous. Neck: No stridor.   Cardiovascular: Normal rate, regular rhythm. Grossly normal heart sounds.  Good peripheral circulation. Respiratory:  Normal respiratory effort.  No retractions. Lungs CTAB. Gastrointestinal: Soft and nontender. No distention. No abdominal bruits. No CVA tenderness. Musculoskeletal:  RLE: There is a single source of brisk, bright red bleeding beneath a staple on the anterior aspect wound around the area of the knee. There is a moderate degree of postoperative swelling. 2+ femoral and distal pulses. Neurologic:  Normal speech and language. No gross focal neurologic deficits are appreciated. Gait not tested. Skin:  Skin is warm, dry and intact. No rash noted. Psychiatric: Mood and affect are normal. Speech and behavior are normal.  ____________________________________________   LABS (all labs ordered are listed, but only abnormal results are displayed)  Labs Reviewed  CBC WITH DIFFERENTIAL/PLATELET - Abnormal; Notable for the following:    RBC 2.34 (*)    Hemoglobin 7.4 (*)    HCT 21.5 (*)    RDW 15.8 (*)    Lymphs Abs 0.7 (*)    All other components within normal limits  BASIC METABOLIC PANEL - Abnormal; Notable for the following:    Sodium 129 (*)    Chloride 89 (*)  Glucose, Bld 120 (*)    Calcium 8.0 (*)    All other components within normal limits  TYPE AND SCREEN   ____________________________________________  EKG  None ____________________________________________  RADIOLOGY  None ____________________________________________   PROCEDURES  Procedure(s) performed: None  Critical Care performed: No  ____________________________________________   INITIAL IMPRESSION / ASSESSMENT AND PLAN / ED COURSE  Pertinent labs & imaging results that were available during my care of the patient were reviewed by me and considered in my medical decision making (see chart for details).  81 year old male who presents with post operative bleeding. Noted patient had lower H/H at discharge. Will repeat CBC, consult orthopedics.  ----------------------------------------- 4:29 AM on  09/06/2015 -----------------------------------------  Discussed with Dr. Jerline Pain (on call orthopedics) who will evaluate patient in the emergency department. H/H noted. Type and screen ordered.  ----------------------------------------- 5:30 AM on 09/06/2015 -----------------------------------------  Patient was seen by Dr. Jerline Pain who placed a compressive dressing. Plan to reevaluate patient approximately 8 AM to check for persistent bleeding.  ----------------------------------------- 6:56 AM on 09/06/2015 -----------------------------------------  Patient resting in no acute distress. Orthopedics to reassess postoperative bleeding around 8 AM. Care transferred to Dr. Cinda Quest. ____________________________________________   FINAL CLINICAL IMPRESSION(S) / ED DIAGNOSES  Final diagnoses:  Postoperative hemorrhage of musculoskeletal structure following musculoskeletal procedure      Paulette Blanch, MD 09/06/15 [phone]

## 2015-09-06 NOTE — ED Notes (Signed)
MD at bedside. 

## 2015-09-06 NOTE — Consult Note (Addendum)
ORTHOPAEDIC CONSULTATION  PATIENT NAME: [name] DOB: 07-04-1934  MRN: [id number]  REQUESTING PHYSICIAN: No att. providers found  Chief Complaint: Bleeding of wound s/p Left TKA  HPI: [name] is a 81 y.o. male who is transferred from his care facility after the nurses noticed excessive bleeding from his distal wound. He was discharged from the hospital on 10/8 in stable condition but did have a small problem with oozing after the surgery.   The nurses stated he soaked through 3 dressings in 12 hrs. He is here for a wound check.   Past Medical History  Diagnosis Date  . CAD (coronary artery disease)   . Neurogenic bladder   . Asthma   . Chronic sinusitis   . Diverticulitis   . GERD (gastroesophageal reflux disease)   . Arthritis     rheumatoid  . Cancer [facility])     Prostate  . IBS (irritable bowel syndrome)   . Anxiety   . Neurodermatitis   . ED (erectile dysfunction)   . OSA (obstructive sleep apnea)   . Allergy   . Hypertension   . Depression   . Hx of adenomatous colonic polyps   . History of SIADH   . Hyponatremia   . Chronic kidney disease     permnaent foley catheter  . Anemia    Past Surgical History  Procedure Laterality Date  . Prostate surgery      PROSTATECTOMY  . Knee surgery      ARTHROSCOPY LEFT  . Nasal sinus surgery  2009    DEVIATED SEPTUM AND POLYPS  . Urethral stricture dilatation  02-2010    Dr.Cope  . Total knee arthroplasty Left 9/15    Dr [name]  . Joint replacement    . Knee arthroplasty Right 09/02/2015    Procedure: COMPUTER ASSISTED TOTAL KNEE ARTHROPLASTY;  Surgeon: [name], MD;  Location: [facility] ORS;  Service: Orthopedics;  Laterality: Right;   Social History   Social History  . Marital Status: Married    Spouse Name: N/A  . Number of Children: 2  . Years of Education: N/A   Occupational History  . Radio station owner     retired   Social History Main Topics  . Smoking status: Never Smoker   . Smokeless  tobacco: Never Used  . Alcohol Use: Yes     Comment: 4-5 nights/week  . Drug Use: No  . Sexual Activity: Not Asked   Other Topics Concern  . None   Social History Narrative   Not sure about a living will or health care POA   Wife should make health care decisions for him   Would accept resuscitation attempts   Not sure about tube feeds   Family History  Problem Relation Age of Onset  . Heart disease Mother 32    heart failure  . Pneumonia Father 78    pnemonia   Allergies  Allergen Reactions  . Ciprofloxacin Nausea And Vomiting    Headache  . Citalopram Hydrobromide Other (See Comments)    "unknown"  . Lorazepam     Adverse reaction  . Paroxetine Nausea Only  . Ramipril Other (See Comments)    "unknown"  . Simvastatin    Prior to Admission medications   Medication Sig Start Date End Date Taking? Authorizing Provider  ALPRAZolam Duanne Moron) 0.5 MG tablet TAKE TABLET BY MOUTH THREE TIMES DAILY AS NEEDED FOR ANXIETY 08/14/15  Yes [name], MD  Cholecalciferol (VITAMIN D3) 5000  UNITS CAPS Take 1 capsule by mouth daily.    Historical Provider, MD  Coenzyme Q10 (CO Q 10) 100 MG CAPS Take 1 capsule by mouth daily.    Historical Provider, MD  Cranberry 425 MG CAPS Take 2 capsules by mouth daily.    Historical Provider, MD  enoxaparin (LOVENOX) 30 MG/0.3ML injection Inject 0.3 mLs (30 mg total) into the skin every 12 (twelve) hours. 09/05/15 09/19/15  Watt Climes, PA  fluticasone (FLONASE) 50 MCG/ACT nasal spray Place 2 sprays into both nostrils 2 (two) times daily.    Historical Provider, MD  FOLIC ACID PO Take 532 mcg by mouth daily.     Historical Provider, MD  furosemide (LASIX) 20 MG tablet Take 20 mg by mouth at bedtime.    Historical Provider, MD  gabapentin (NEURONTIN) 300 MG capsule TAKE ONE CAPSULE BY MOUTH THREE TIMES DAILY 06/17/15   [name], MD  metoprolol succinate (TOPROL-XL) 50 MG 24 hr tablet TAKE 1 TABLET BY MOUTH EVERY DAY 12/09/14   [name],  MD  Milk Thistle 250 MG CAPS Take 1 capsule by mouth daily.    Historical Provider, MD  Multiple Vitamin (MULTIVITAMIN) tablet Take 1 tablet by mouth daily.      Historical Provider, MD  omalizumab Arvid Right) 150 MG injection Inject into the skin every 14 (fourteen) days.     Historical Provider, MD  Omega-3 Fatty Acids (FISH OIL) 1200 MG CAPS Take 2 capsules by mouth daily.    Historical Provider, MD  omeprazole (PRILOSEC) 20 MG capsule TAKE 1 CAPSULE BY MOUTH EVERY DAY 01/26/15   [name], MD  oxyCODONE (OXY IR/ROXICODONE) 5 MG immediate release tablet Take 1-2 tablets (5-10 mg total) by mouth every 4 (four) hours as needed for severe pain or breakthrough pain. 09/03/15   Watt Climes, PA  polyethylene glycol [facility]) packet Take 34 g by mouth daily.     Historical Provider, MD  pravastatin (PRAVACHOL) 20 MG tablet TAKE 1 TABLET BY MOUTH EVERY DAY 04/14/15   [name], MD  Probiotic Product (ALIGN) 4 MG CAPS Take 1 capsule by mouth 2 (two) times daily.    Historical Provider, MD  Selenium 200 MCG CAPS Take 1 capsule by mouth daily.    Historical Provider, MD  solifenacin (VESICARE) 5 MG tablet Take 5 mg by mouth at bedtime. For bladder spasms    Historical Provider, MD  traMADol (ULTRAM) 50 MG tablet Take 1-2 tablets (50-100 mg total) by mouth every 4 (four) hours as needed for moderate pain. 09/03/15   Watt Climes, PA  Turmeric 500 MG CAPS Take 500 mg by mouth daily.    Historical Provider, MD  vitamin B-12 (CYANOCOBALAMIN) 500 MCG tablet Take 500 mcg by mouth daily.    Historical Provider, MD   No results found.  Positive ROS: All other systems have been reviewed and were otherwise negative with the exception of those mentioned in the HPI and as above.  Physical Exam: General: Alert and alert in no acute distress. HEENT: Atraumatic and normocephalic. Sclera are clear. Extraocular motion is intact. Oropharynx is clear with moist mucosa. Neck: Supple, nontender, good range of  motion. No JVD or carotid bruits. Lungs: Clear to auscultation bilaterally. Cardiovascular: Regular rate and rhythm with normal S1 and S2. No murmurs. No gallops or rubs. Pedal pulses are palpable bilaterally. Homans test is negative bilaterally. No significant pretibial or ankle edema. Abdomen: Soft, nontender, and nondistended. Bowel sounds are present. Skin:  No lesions in the area of chief complaint Neurologic: Awake, alert, and oriented. Sensory function is grossly intact. Motor strength is felt to be 5 over 5 bilaterally. No clonus or tremor. Good motor coordination. Lymphatic: No axillary or cervical lymphadenopathy  MUSCULOSKELETAL: Left knee  Wound benign  Staples in place No evidence of infection Obvious bleeding in the distal portion of the wound, no evidence of active arterial bleed. Mild evidence of a hematoma within the joint Upon 5 minutes pressure the bleeding stops   Assessment: 81 year old male status post left total knee arthroplasty with continued bleeding from distal incision.  Plan: 1. Compression was applied to the wound after which the bleeding stopped 2. The wound was cleaned with ChloraPrep 3. Dermabond was applied to the area of concern at the distal aspect of the wound over the proximal tibia 4. A compressive dressing was applied in addition to a knee immobilizer 5. The patient was then observed for 3 hours in the emergency department, at which point the wound was reexamined there was no evidence of rebleeding.  The plan at this time will be for the patient to take a respite from physical therapy for the next 2 days. At this time he should only get up to go to the bathroom and for gentle ambulation. On day 3 he should start ambulating. Time with physical therapy but should not receive range of motion therapy from the therapist. On day 4 he can start gentle range of motion through the knee after the wound has granulated more.  I encouraged and reminded the  patient to make sure that he had compressive SCDs at the facility, that he also received 30 mg of Lovenox daily, and that he keeps his leg in full extension at all times with his toes pointed towards this guy to help with knee extension. He will follow up on his regularly scheduled appointment.  James P. Holley Bouche M.D.

## 2015-09-06 NOTE — Discharge Instructions (Signed)
Per orthopedics discharge instructions are as follows: 1 elevate extremity 2 no PT with the exception of gentle ambulation for 2 days 3 on day 3 patient may ambulate 4 resume PT on day 4 with gentle range of motion 5 SCD to operative extremity 6 do not remove dressing until Thursday 7 keep knee compressed 8 Lovenox 30 milligrams twice a day  I would also suggest that his doctor order a CBC tomorrow to check on his possible blood loss. Please return to the ER for any further problems dripping from dressing saturated dressing etc.

## 2015-09-06 NOTE — ED Notes (Signed)
Ortho surgeon at bedside for eval.

## 2015-09-06 NOTE — ED Notes (Signed)
Dr. Jerline Pain (ortho surgeon) at bedside for pressure dressing and knee immobilizer application to right knee. Per Dr. Jerline Pain, dressing to be left on until 8 AM and will re-assess bleeding at that time. Bleeding controlled at this time after pressure to right incision site.

## 2015-09-07 ENCOUNTER — Encounter: Payer: Self-pay | Admitting: Student

## 2015-09-07 ENCOUNTER — Observation Stay
Admission: AD | Admit: 2015-09-07 | Discharge: 2015-09-08 | Disposition: A | Discharge status: Skilled Nursing Facility | Payer: Medicare PPO | Source: Ambulatory Visit | Attending: Internal Medicine | Admitting: Internal Medicine

## 2015-09-07 ENCOUNTER — Telehealth: Payer: Self-pay | Admitting: Internal Medicine

## 2015-09-07 DIAGNOSIS — G4733 Obstructive sleep apnea (adult) (pediatric): Secondary | ICD-10-CM | POA: Diagnosis not present

## 2015-09-07 DIAGNOSIS — Z9079 Acquired absence of other genital organ(s): Secondary | ICD-10-CM | POA: Diagnosis not present

## 2015-09-07 DIAGNOSIS — I2510 Atherosclerotic heart disease of native coronary artery without angina pectoris: Secondary | ICD-10-CM | POA: Insufficient documentation

## 2015-09-07 DIAGNOSIS — I10 Essential (primary) hypertension: Secondary | ICD-10-CM | POA: Diagnosis not present

## 2015-09-07 DIAGNOSIS — F329 Major depressive disorder, single episode, unspecified: Secondary | ICD-10-CM | POA: Diagnosis not present

## 2015-09-07 DIAGNOSIS — D649 Anemia, unspecified: Principal | ICD-10-CM

## 2015-09-07 DIAGNOSIS — Z96651 Presence of right artificial knee joint: Secondary | ICD-10-CM | POA: Diagnosis not present

## 2015-09-07 DIAGNOSIS — E871 Hypo-osmolality and hyponatremia: Secondary | ICD-10-CM | POA: Insufficient documentation

## 2015-09-07 DIAGNOSIS — Z7901 Long term (current) use of anticoagulants: Secondary | ICD-10-CM | POA: Diagnosis not present

## 2015-09-07 DIAGNOSIS — Z8489 Family history of other specified conditions: Secondary | ICD-10-CM | POA: Insufficient documentation

## 2015-09-07 DIAGNOSIS — Z881 Allergy status to other antibiotic agents status: Secondary | ICD-10-CM | POA: Diagnosis not present

## 2015-09-07 DIAGNOSIS — Z8249 Family history of ischemic heart disease and other diseases of the circulatory system: Secondary | ICD-10-CM | POA: Diagnosis not present

## 2015-09-07 DIAGNOSIS — N319 Neuromuscular dysfunction of bladder, unspecified: Secondary | ICD-10-CM | POA: Diagnosis not present

## 2015-09-07 DIAGNOSIS — Z86010 Personal history of colonic polyps: Secondary | ICD-10-CM | POA: Diagnosis not present

## 2015-09-07 DIAGNOSIS — D62 Acute posthemorrhagic anemia: Secondary | ICD-10-CM | POA: Diagnosis present

## 2015-09-07 DIAGNOSIS — M179 Osteoarthritis of knee, unspecified: Secondary | ICD-10-CM | POA: Diagnosis not present

## 2015-09-07 DIAGNOSIS — Z79899 Other long term (current) drug therapy: Secondary | ICD-10-CM | POA: Diagnosis not present

## 2015-09-07 DIAGNOSIS — Z888 Allergy status to other drugs, medicaments and biological substances status: Secondary | ICD-10-CM | POA: Insufficient documentation

## 2015-09-07 DIAGNOSIS — J45909 Unspecified asthma, uncomplicated: Secondary | ICD-10-CM | POA: Insufficient documentation

## 2015-09-07 DIAGNOSIS — F419 Anxiety disorder, unspecified: Secondary | ICD-10-CM | POA: Diagnosis not present

## 2015-09-07 DIAGNOSIS — K219 Gastro-esophageal reflux disease without esophagitis: Secondary | ICD-10-CM | POA: Diagnosis not present

## 2015-09-07 DIAGNOSIS — M159 Polyosteoarthritis, unspecified: Secondary | ICD-10-CM | POA: Diagnosis not present

## 2015-09-07 DIAGNOSIS — K589 Irritable bowel syndrome without diarrhea: Secondary | ICD-10-CM | POA: Diagnosis not present

## 2015-09-07 DIAGNOSIS — Z79891 Long term (current) use of opiate analgesic: Secondary | ICD-10-CM | POA: Insufficient documentation

## 2015-09-07 DIAGNOSIS — N189 Chronic kidney disease, unspecified: Secondary | ICD-10-CM | POA: Insufficient documentation

## 2015-09-07 DIAGNOSIS — I1310 Hypertensive heart and chronic kidney disease without heart failure, with stage 1 through stage 4 chronic kidney disease, or unspecified chronic kidney disease: Secondary | ICD-10-CM | POA: Diagnosis not present

## 2015-09-07 DIAGNOSIS — J45901 Unspecified asthma with (acute) exacerbation: Secondary | ICD-10-CM | POA: Diagnosis not present

## 2015-09-07 DIAGNOSIS — M069 Rheumatoid arthritis, unspecified: Secondary | ICD-10-CM | POA: Diagnosis not present

## 2015-09-07 LAB — CBC WITH DIFFERENTIAL/PLATELET
BASOS ABS: 0 10*3/uL (ref 0–0.1)
BASOS PCT: 1 %
EOS ABS: 0.4 10*3/uL (ref 0–0.7)
EOS PCT: 6 %
HCT: 19.1 % — ABNORMAL LOW (ref 40.0–52.0)
HEMOGLOBIN: 6.5 g/dL — AB (ref 13.0–18.0)
Lymphocytes Relative: 12 %
Lymphs Abs: 0.8 10*3/uL — ABNORMAL LOW (ref 1.0–3.6)
MCH: 31.6 pg (ref 26.0–34.0)
MCHC: 34.1 g/dL (ref 32.0–36.0)
MCV: 92.8 fL (ref 80.0–100.0)
Monocytes Absolute: 0.9 10*3/uL (ref 0.2–1.0)
Monocytes Relative: 13 %
NEUTROS PCT: 68 %
Neutro Abs: 4.8 10*3/uL (ref 1.4–6.5)
PLATELETS: 157 10*3/uL (ref 150–440)
RBC: 2.05 MIL/uL — AB (ref 4.40–5.90)
RDW: 16.8 % — ABNORMAL HIGH (ref 11.5–14.5)
WBC: 7 10*3/uL (ref 3.8–10.6)

## 2015-09-07 LAB — TYPE AND SCREEN
ABO/RH(D): A POS
ANTIBODY SCREEN: NEGATIVE
UNIT DIVISION: 0
Unit division: 0

## 2015-09-07 LAB — CREATININE, SERUM
Creatinine, Ser: 0.59 mg/dL — ABNORMAL LOW (ref 0.61–1.24)
GFR calc non Af Amer: >60 mL/min (ref >60)

## 2015-09-07 LAB — CBC
HCT: 20.1 % — ABNORMAL LOW (ref 40.0–52.0)
Hemoglobin: 6.9 g/dL — ABNORMAL LOW (ref 13.0–18.0)
MCH: 32 pg (ref 26.0–34.0)
MCHC: 34.3 g/dL (ref 32.0–36.0)
MCV: 93.2 fL (ref 80.0–100.0)
PLATELETS: 181 10*3/uL (ref 150–440)
RBC: 2.15 MIL/uL — ABNORMAL LOW (ref 4.40–5.90)
RDW: 17 % — AB (ref 11.5–14.5)
WBC: 8.4 10*3/uL (ref 3.8–10.6)

## 2015-09-07 LAB — PREPARE RBC (CROSSMATCH)

## 2015-09-07 LAB — GLUCOSE, CAPILLARY
GLUCOSE-CAPILLARY: 149 mg/dL — AB (ref 65–99)
Glucose-Capillary: 120 mg/dL — ABNORMAL HIGH (ref 65–99)

## 2015-09-07 MED ORDER — SELENIUM 200 MCG PO CAPS: 1.0000 | ORAL_CAPSULE | Freq: Every day | ORAL | Status: DC

## 2015-09-07 MED ORDER — TRAMADOL HCL 50 MG PO TABS: 50.0000 mg | ORAL_TABLET | ORAL | Status: DC | PRN

## 2015-09-07 MED ORDER — FUROSEMIDE 20 MG PO TABS
20.0000 mg | ORAL_TABLET | Freq: Every day | ORAL | Status: DC
  Administered 2015-09-07: 21:00:00 20 mg via ORAL
  Filled 2015-09-07: qty 1

## 2015-09-07 MED ORDER — DARIFENACIN HYDROBROMIDE ER 7.5 MG PO TB24
7.5000 mg | ORAL_TABLET | Freq: Every day | ORAL | Status: DC
Start: 2015-09-07 — End: 2015-09-07
  Filled 2015-09-07 (×2): qty 1

## 2015-09-07 MED ORDER — ENOXAPARIN SODIUM 40 MG/0.4ML ~~LOC~~ SOLN: 40.0000 mg | SUBCUTANEOUS | Status: DC

## 2015-09-07 MED ORDER — ACETAMINOPHEN 325 MG PO TABS: 650.0000 mg | ORAL_TABLET | Freq: Four times a day (QID) | ORAL | Status: DC | PRN

## 2015-09-07 MED ORDER — OXYCODONE HCL 5 MG PO TABS
5.0000 mg | ORAL_TABLET | ORAL | Status: DC | PRN
  Administered 2015-09-07 – 2015-09-08 (×2): 5 mg via ORAL
  Filled 2015-09-07 (×2): qty 1

## 2015-09-07 MED ORDER — ALPRAZOLAM 0.5 MG PO TABS
0.5000 mg | ORAL_TABLET | Freq: Three times a day (TID) | ORAL | Status: DC | PRN
  Administered 2015-09-08 (×2): 0.5 mg via ORAL
  Filled 2015-09-07 (×2): qty 1

## 2015-09-07 MED ORDER — ONDANSETRON HCL 4 MG PO TABS: 4.0000 mg | ORAL_TABLET | Freq: Four times a day (QID) | ORAL | Status: DC | PRN

## 2015-09-07 MED ORDER — VITAMIN D 1000 UNITS PO TABS
5000.0000 [IU] | ORAL_TABLET | Freq: Every day | ORAL | Status: DC
  Administered 2015-09-08: 09:00:00 5000 [IU] via ORAL
  Filled 2015-09-07: qty 5

## 2015-09-07 MED ORDER — PRAVASTATIN SODIUM 20 MG PO TABS
20.0000 mg | ORAL_TABLET | Freq: Every day | ORAL | Status: DC
  Administered 2015-09-08: 20 mg via ORAL
  Filled 2015-09-07: qty 1

## 2015-09-07 MED ORDER — DARIFENACIN HYDROBROMIDE ER 7.5 MG PO TB24
7.5000 mg | ORAL_TABLET | Freq: Every day | ORAL | Status: DC
  Administered 2015-09-07 – 2015-09-08 (×2): 7.5 mg via ORAL
  Filled 2015-09-07: qty 1

## 2015-09-07 MED ORDER — VITAMIN B-12 500 MCG PO TABS
500.0000 ug | ORAL_TABLET | Freq: Every day | ORAL | Status: DC
  Administered 2015-09-08: 500 ug via ORAL
  Filled 2015-09-07 (×2): qty 1

## 2015-09-07 MED ORDER — PANTOPRAZOLE SODIUM 40 MG PO TBEC
40.0000 mg | DELAYED_RELEASE_TABLET | Freq: Every day | ORAL | Status: DC
  Administered 2015-09-08: 09:00:00 40 mg via ORAL
  Filled 2015-09-07: qty 1

## 2015-09-07 MED ORDER — FOLIC ACID 1 MG PO TABS
500.0000 ug | ORAL_TABLET | Freq: Every day | ORAL | Status: DC
  Administered 2015-09-08: 0.5 mg via ORAL
  Filled 2015-09-07: qty 1

## 2015-09-07 MED ORDER — METOPROLOL SUCCINATE ER 50 MG PO TB24
50.0000 mg | ORAL_TABLET | Freq: Every day | ORAL | Status: DC
  Administered 2015-09-08: 50 mg via ORAL
  Filled 2015-09-07: qty 1

## 2015-09-07 MED ORDER — ACETAMINOPHEN 650 MG RE SUPP: 650.0000 mg | Freq: Four times a day (QID) | RECTAL | Status: DC | PRN

## 2015-09-07 MED ORDER — FLUTICASONE PROPIONATE 50 MCG/ACT NA SUSP
2.0000 | Freq: Two times a day (BID) | NASAL | Status: DC
  Filled 2015-09-07: qty 16

## 2015-09-07 MED ORDER — ONDANSETRON HCL 4 MG/2ML IJ SOLN
4.0000 mg | Freq: Four times a day (QID) | INTRAMUSCULAR | Status: DC | PRN
  Administered 2015-09-07: 4 mg via INTRAVENOUS
  Filled 2015-09-07: qty 2

## 2015-09-07 MED ORDER — FERROUS SULFATE 325 (65 FE) MG PO TABS
325.0000 mg | ORAL_TABLET | Freq: Two times a day (BID) | ORAL | Status: DC
  Administered 2015-09-07 – 2015-09-08 (×2): 325 mg via ORAL
  Filled 2015-09-07 (×2): qty 1

## 2015-09-07 MED ORDER — GABAPENTIN 300 MG PO CAPS
300.0000 mg | ORAL_CAPSULE | Freq: Three times a day (TID) | ORAL | Status: DC
  Administered 2015-09-07 – 2015-09-08 (×3): 300 mg via ORAL
  Filled 2015-09-07 (×3): qty 1

## 2015-09-07 MED ORDER — POLYETHYLENE GLYCOL 3350 17 G PO PACK
34.0000 g | PACK | Freq: Every day | ORAL | Status: DC
  Filled 2015-09-07: qty 2

## 2015-09-07 MED ORDER — ADULT MULTIVITAMIN W/MINERALS CH
1.0000 | ORAL_TABLET | Freq: Every day | ORAL | Status: DC
  Administered 2015-09-08: 1 via ORAL
  Filled 2015-09-07: qty 1

## 2015-09-07 MED ORDER — SODIUM CHLORIDE 0.9 % IV SOLN
Freq: Once | INTRAVENOUS | Status: AC
  Administered 2015-09-07: 17:00:00 via INTRAVENOUS

## 2015-09-07 MED ORDER — RISAQUAD PO CAPS
1.0000 | ORAL_CAPSULE | Freq: Two times a day (BID) | ORAL | Status: DC
  Administered 2015-09-07 – 2015-09-08 (×3): 1 via ORAL
  Filled 2015-09-07 (×3): qty 1

## 2015-09-07 NOTE — Telephone Encounter (Signed)
Pt wanted to let Dr. Silvio Pate know he was at the hospital to get a blood transfusion due to excessive blood loss after knee surgery.

## 2015-09-07 NOTE — H&P (Signed)
[place] [facility] [place] NAME: [name]    MR#:  [id number]  DATE OF BIRTH:  Aug 02, 1934  DATE OF ADMISSION:  09/07/2015  PRIMARY CARE PHYSICIAN: Viviana Simpler, MD   REQUESTING/REFERRING PHYSICIAN: Frazier Richards M.D.  CHIEF COMPLAINT:  No chief complaint on file.   HISTORY OF PRESENT ILLNESS: [name]  is a 81 y.o. male with a known history of  coronary artery disease, neurogenic bladder, asthma ,, GERD, anxiety, low amplitude to cope with depression with severe osteoarthritis of the knee 1 underwent recent knee surgery with knee replacement. Patient was discharged recently from the hospital after the procedure. He did have bleeding postop. Which was felt to be not very unusual per orthopedics. After he was discharged patient had further bleeding and he was sent back to the emergency room from the nursing home. Again he had pressure bandage placed and was discharged back to the rehabilitation. The patient has had his hemoglobin checked. Last month his hemoglobin was normal now his hemoglobin is dropped to 6.5 use very fatigued and tired. Therefore he was referred by Dr. Ouida Sills from nursing home for admission for transfusion. Patient denies any chest pain or shortness of breath but complains of severe weakness.    PAST MEDICAL HISTORY:   Past Medical History  Diagnosis Date  . CAD (coronary artery disease)   . Neurogenic bladder   . Asthma   . Chronic sinusitis   . Diverticulitis   . GERD (gastroesophageal reflux disease)   . Arthritis     rheumatoid  . Cancer [facility])     Prostate  . IBS (irritable bowel syndrome)   . Anxiety   . Neurodermatitis   . ED (erectile dysfunction)   . OSA (obstructive sleep apnea)   . Allergy   . Hypertension   . Depression   . Hx of adenomatous colonic polyps   . History of SIADH   . Hyponatremia   . Chronic kidney disease     permnaent foley catheter  . Anemia     PAST SURGICAL HISTORY:   Past Surgical History  Procedure Laterality Date  . Prostate surgery      PROSTATECTOMY  . Knee surgery      ARTHROSCOPY LEFT  . Nasal sinus surgery  2009    DEVIATED SEPTUM AND POLYPS  . Urethral stricture dilatation  02-2010    Dr.Cope  . Total knee arthroplasty Left 9/15    Dr [name]  . Joint replacement    . Knee arthroplasty Right 09/02/2015    Procedure: COMPUTER ASSISTED TOTAL KNEE ARTHROPLASTY;  Surgeon: [name], MD;  Location: [facility] ORS;  Service: Orthopedics;  Laterality: Right;    SOCIAL HISTORY:  Social History  Substance Use Topics  . Smoking status: Never Smoker   . Smokeless tobacco: Never Used  . Alcohol Use: Yes     Comment: 4-5 nights/week    FAMILY HISTORY:  Family History  Problem Relation Age of Onset  . Heart disease Mother 109    heart failure  . Pneumonia Father 21    pnemonia    DRUG ALLERGIES:  Allergies  Allergen Reactions  . Ciprofloxacin Nausea And Vomiting    Headache  . Citalopram Hydrobromide Other (See Comments)    "unknown"  . Lorazepam     Adverse reaction  . Paroxetine Nausea Only  . Ramipril Other (See Comments)    "unknown"  . Simvastatin     REVIEW OF SYSTEMS:  CONSTITUTIONAL: No fever, positive fatigue and weakness.  EYES: No blurred or double vision.  EARS, NOSE, AND THROAT: No tinnitus or ear pain.  RESPIRATORY: No cough, shortness of breath, wheezing or hemoptysis.  CARDIOVASCULAR: No chest pain, orthopnea, edema.  GASTROINTESTINAL: No nausea, vomiting, diarrhea or abdominal pain.  GENITOURINARY: No dysuria, hematuria.  ENDOCRINE: No polyuria, nocturia,  HEMATOLOGY: No anemia, easy bruising or bleeding SKIN: No rash or lesion. MUSCULOSKELETAL: Chronic joint pain and arthritis.   NEUROLOGIC: No tingling, numbness, weakness.  PSYCHIATRY: No anxiety or depression.   MEDICATIONS AT HOME:  Prior to Admission medications   Medication Sig Start Date End Date Taking? Authorizing Provider  ALPRAZolam Duanne Moron)  0.5 MG tablet TAKE TABLET BY MOUTH THREE TIMES DAILY AS NEEDED FOR ANXIETY 08/14/15   [name], MD  Cholecalciferol (VITAMIN D3) 5000 UNITS CAPS Take 1 capsule by mouth daily.    Historical Provider, MD  Coenzyme Q10 (CO Q 10) 100 MG CAPS Take 1 capsule by mouth daily.    Historical Provider, MD  Cranberry 425 MG CAPS Take 2 capsules by mouth daily.    Historical Provider, MD  enoxaparin (LOVENOX) 30 MG/0.3ML injection Inject 0.3 mLs (30 mg total) into the skin every 12 (twelve) hours. 09/05/15 09/19/15  Watt Climes, PA  fluticasone (FLONASE) 50 MCG/ACT nasal spray Place 2 sprays into both nostrils 2 (two) times daily.    Historical Provider, MD  FOLIC ACID PO Take 419 mcg by mouth daily.     Historical Provider, MD  furosemide (LASIX) 20 MG tablet Take 20 mg by mouth at bedtime.    Historical Provider, MD  gabapentin (NEURONTIN) 300 MG capsule TAKE ONE CAPSULE BY MOUTH THREE TIMES DAILY 06/17/15   [name], MD  metoprolol succinate (TOPROL-XL) 50 MG 24 hr tablet TAKE 1 TABLET BY MOUTH EVERY DAY 12/09/14   [name], MD  Milk Thistle 250 MG CAPS Take 1 capsule by mouth daily.    Historical Provider, MD  Multiple Vitamin (MULTIVITAMIN) tablet Take 1 tablet by mouth daily.      Historical Provider, MD  omalizumab Arvid Right) 150 MG injection Inject into the skin every 14 (fourteen) days.     Historical Provider, MD  Omega-3 Fatty Acids (FISH OIL) 1200 MG CAPS Take 2 capsules by mouth daily.    Historical Provider, MD  omeprazole (PRILOSEC) 20 MG capsule TAKE 1 CAPSULE BY MOUTH EVERY DAY 01/26/15   [name], MD  oxyCODONE (OXY IR/ROXICODONE) 5 MG immediate release tablet Take 1-2 tablets (5-10 mg total) by mouth every 4 (four) hours as needed for severe pain or breakthrough pain. 09/03/15   Watt Climes, PA  polyethylene glycol [facility]) packet Take 34 g by mouth daily.     Historical Provider, MD  pravastatin (PRAVACHOL) 20 MG tablet TAKE 1 TABLET BY MOUTH EVERY DAY 04/14/15    [name], MD  Probiotic Product (ALIGN) 4 MG CAPS Take 1 capsule by mouth 2 (two) times daily.    Historical Provider, MD  Selenium 200 MCG CAPS Take 1 capsule by mouth daily.    Historical Provider, MD  solifenacin (VESICARE) 5 MG tablet Take 5 mg by mouth at bedtime. For bladder spasms    Historical Provider, MD  traMADol (ULTRAM) 50 MG tablet Take 1-2 tablets (50-100 mg total) by mouth every 4 (four) hours as needed for moderate pain. 09/03/15   Watt Climes, PA  Turmeric 500 MG CAPS Take 500 mg by mouth daily.  Historical Provider, MD  vitamin B-12 (CYANOCOBALAMIN) 500 MCG tablet Take 500 mcg by mouth daily.    Historical Provider, MD      PHYSICAL EXAMINATION:   VITAL SIGNS: Blood pressure 140/49, pulse 73, temperature 98.1 F (36.7 C), temperature source Oral, resp. rate 19, SpO2 90 %.  GENERAL:  81 y.o.-year-old patient lying in the bed with no acute distress.  EYES: Pupils equal, round, reactive to light and accommodation. No scleral icterus. Extraocular muscles intact.  HEENT: Head atraumatic, normocephalic. Oropharynx and nasopharynx clear.  NECK:  Supple, no jugular venous distention. No thyroid enlargement, no tenderness.  LUNGS: Normal breath sounds bilaterally, no wheezing, rales,rhonchi or crepitation. No use of accessory muscles of respiration.  CARDIOVASCULAR: S1, S2 normal. No murmurs, rubs, or gallops.  ABDOMEN: Soft, nontender, nondistended. Bowel sounds present. No organomegaly or mass.  EXTREMITIES: No pedal edema, cyanosis, or clubbing. He has  intact dressing in the right knee  NEUROLOGIC: Cranial nerves II through XII are intact. Muscle strength 5/5 in all extremities. Sensation intact. Gait not checked.  PSYCHIATRIC: The patient is alert and oriented x 3.  SKIN: No obvious rash, lesion, or ulcer.   LABORATORY PANEL:   CBC  Recent Labs Lab 09/03/15 0453 09/04/15 0506 09/05/15 0350 09/06/15 0410 09/07/15 0645  WBC 6.5 7.3 8.0 8.1 7.0  HGB 9.8*  8.9* 8.2* 7.4* 6.5*  HCT 28.6* 25.7* 23.5* 21.5* 19.1*  PLT 110* 105* 139* 162 157  MCV 92.8 91.2 90.9 92.0 92.8  MCH 31.6 31.6 31.6 31.5 31.6  MCHC 34.1 34.7 34.8 34.2 34.1  RDW 16.8* 16.2* 16.0* 15.8* 16.8*  LYMPHSABS  --   --   --  0.7* 0.8*  MONOABS  --   --   --  0.9 0.9  EOSABS  --   --   --  0.4 0.4  BASOSABS  --   --   --  0.0 0.0   ------------------------------------------------------------------------------------------------------------------  Chemistries   Recent Labs Lab 09/03/15 0453 09/06/15 0410  NA 134* 129*  K 3.9 3.7  CL 98* 89*  CO2 29 32  GLUCOSE 116* 120*  BUN 12 15  CREATININE 0.70 0.76  CALCIUM 7.6* 8.0*   ------------------------------------------------------------------------------------------------------------------ estimated creatinine clearance is 92.2 mL/min (by C-G formula based on Cr of 0.76). ------------------------------------------------------------------------------------------------------------------ No results for input(s): TSH, T4TOTAL, T3FREE, THYROIDAB in the last 72 hours.  Invalid input(s): FREET3   Coagulation profile No results for input(s): INR, PROTIME in the last 168 hours. ------------------------------------------------------------------------------------------------------------------- No results for input(s): DDIMER in the last 72 hours. -------------------------------------------------------------------------------------------------------------------  Cardiac Enzymes No results for input(s): CKMB, TROPONINI, MYOGLOBIN in the last 168 hours.  Invalid input(s): CK ------------------------------------------------------------------------------------------------------------------ Invalid input(s): POCBNP  ---------------------------------------------------------------------------------------------------------------  Urinalysis    Component Value Date/Time   COLORURINE COLORLESS* 08/26/2015 [place]  09/28/2014 1610   APPEARANCEUR CLEAR* 08/26/2015 1545   APPEARANCEUR CLOUDY 09/28/2014 1610   LABSPEC 1.000* 08/26/2015 1545   LABSPEC 1.025 09/28/2014 1610   PHURINE 6.0 08/26/2015 1545   PHURINE see comment 09/28/2014 1610   GLUCOSEU NEGATIVE 08/26/2015 1545   GLUCOSEU see comment 09/28/2014 1610   HGBUR NEGATIVE 08/26/2015 1545   HGBUR see comment 09/28/2014 1610   HGBUR trace-lysed 02/22/2010 1536   BILIRUBINUR NEGATIVE 08/26/2015 1545   BILIRUBINUR see comment 09/28/2014 1610   KETONESUR NEGATIVE 08/26/2015 1545   KETONESUR see comment 09/28/2014 1610   PROTEINUR NEGATIVE 08/26/2015 1545   PROTEINUR see comment 09/28/2014 1610   UROBILINOGEN 0.2 02/22/2010 1536   NITRITE  NEGATIVE 08/26/2015 1545   NITRITE SEE COMMENT 09/28/2014 1610   LEUKOCYTESUR NEGATIVE 08/26/2015 1545   LEUKOCYTESUR see comment 09/28/2014 1610     RADIOLOGY: No results found.  EKG: Orders placed or performed during the hospital encounter of 08/26/15  . EKG 12-Lead  . EKG 12-Lead    IMPRESSION AND PLAN:  Patient is a 81 year old white male status post recent  knee replacement being admitted for symptomatic anemia  1. Symptomatic anemia due to acute blood loss as result of his recent surgery, at this time will go ahead and transfuse patient 2 units of packed RBCs risk and benefits explained he is agreeable to transfusion  2. Recent right knee surgery have his orthopedic evaluate the knee make sure  that there is no persistent blood loss  3. Coronary artery disease continue metoprolol  4. GERD continue omeprazole  I will continue him on Lovenox for DVT prophylaxis but once evaluated by orthopedics and is still actively bleeding then consider stopping Lovenox   All the records are reviewed and case discussed with ED provider. Management plans discussed with the patient, family and they are in agreement.  CODE STATUS: full Advance Directive Documentation        Most Recent Value   Type of  Advance Directive  Healthcare Power of Attorney, Living will   Pre-existing out of facility DNR order (yellow form or pink MOST form)     "MOST" Form in Place?         TOTAL TIME TAKING CARE OF THIS PATIENT:  50 minutesminutes.    Dustin Flock M.D on 09/07/2015 at 2:55 PM  Between 7am to 6pm - Pager - [phone]  After 6pm go to [email or web address] - password [email or web address] [facility] [facility]  Office  [phone]  CC: Primary care physician; Viviana Simpler, MD

## 2015-09-07 NOTE — Plan of Care (Signed)
Problem: Discharge Progression Outcomes Goal: Discharge plan in place and appropriate Individualization of Care Outcome: Progressing 1. Pt resides at [facility] for rehab for his total knee replacement 2. Likes to be called [name] 3. Hx of prostate cancer, CAD, neurogenic bladder, HTN, depression, CKD, and amenia controlled by home meds. 4 Pt has chronic foley use Goal: Other Discharge Outcomes/Goals Outcome: Progressing Plan of care progress to goal for: 1. Pain-no c/o pain this shift 2. Hemodynamically-             -VSS, pt remains afebrile this shift             -pt to receive 2 units pRBC 3. Complications-no evidence of this shift 4. Diet-pt tolerating diet this shift 5. Activity-pt is a total assist

## 2015-09-07 NOTE — Telephone Encounter (Signed)
Message on his cell phone  I had heard about the bleeding etc Will try to keep up to date

## 2015-09-07 NOTE — Progress Notes (Signed)
Dr. Lavetta Nielsen notified pt normally takes Vesicare 5mg  at bedtime to prevent bladder spasms but is a non-formulary medication. Pharmacy stated Enablex 7.5mg  is comparable until pt is able to bring in home medication.

## 2015-09-08 DIAGNOSIS — I1310 Hypertensive heart and chronic kidney disease without heart failure, with stage 1 through stage 4 chronic kidney disease, or unspecified chronic kidney disease: Secondary | ICD-10-CM | POA: Diagnosis not present

## 2015-09-08 DIAGNOSIS — N189 Chronic kidney disease, unspecified: Secondary | ICD-10-CM | POA: Diagnosis not present

## 2015-09-08 DIAGNOSIS — I10 Essential (primary) hypertension: Secondary | ICD-10-CM | POA: Diagnosis not present

## 2015-09-08 DIAGNOSIS — J45909 Unspecified asthma, uncomplicated: Secondary | ICD-10-CM | POA: Diagnosis not present

## 2015-09-08 DIAGNOSIS — E871 Hypo-osmolality and hyponatremia: Secondary | ICD-10-CM | POA: Diagnosis not present

## 2015-09-08 DIAGNOSIS — K589 Irritable bowel syndrome without diarrhea: Secondary | ICD-10-CM | POA: Diagnosis not present

## 2015-09-08 DIAGNOSIS — D649 Anemia, unspecified: Secondary | ICD-10-CM | POA: Diagnosis not present

## 2015-09-08 DIAGNOSIS — K219 Gastro-esophageal reflux disease without esophagitis: Secondary | ICD-10-CM | POA: Diagnosis not present

## 2015-09-08 DIAGNOSIS — I2510 Atherosclerotic heart disease of native coronary artery without angina pectoris: Secondary | ICD-10-CM | POA: Diagnosis not present

## 2015-09-08 DIAGNOSIS — N319 Neuromuscular dysfunction of bladder, unspecified: Secondary | ICD-10-CM | POA: Diagnosis not present

## 2015-09-08 LAB — CBC
HEMATOCRIT: 25.4 % — AB (ref 40.0–52.0)
HEMOGLOBIN: 8.9 g/dL — AB (ref 13.0–18.0)
MCH: 31.7 pg (ref 26.0–34.0)
MCHC: 35.2 g/dL (ref 32.0–36.0)
MCV: 90 fL (ref 80.0–100.0)
Platelets: 184 10*3/uL (ref 150–440)
RBC: 2.82 MIL/uL — ABNORMAL LOW (ref 4.40–5.90)
RDW: 17.1 % — ABNORMAL HIGH (ref 11.5–14.5)
WBC: 9.7 10*3/uL (ref 3.8–10.6)

## 2015-09-08 LAB — BASIC METABOLIC PANEL
Anion gap: 7 (ref 5–15)
BUN: 12 mg/dL (ref 6–20)
CHLORIDE: 89 mmol/L — AB (ref 101–111)
CO2: 31 mmol/L (ref 22–32)
CREATININE: 0.62 mg/dL (ref 0.61–1.24)
Calcium: 7.8 mg/dL — ABNORMAL LOW (ref 8.9–10.3)
GFR calc non Af Amer: >60 mL/min (ref >60)
GLUCOSE: 113 mg/dL — AB (ref 65–99)
Potassium: 3.8 mmol/L (ref 3.5–5.1)
Sodium: 127 mmol/L — ABNORMAL LOW (ref 135–145)

## 2015-09-08 MED ORDER — TRAMADOL HCL 50 MG PO TABS: 50.0000 mg | ORAL_TABLET | ORAL | Status: DC | PRN

## 2015-09-08 MED ORDER — OXYCODONE HCL 5 MG PO TABS: 5.0000 mg | ORAL_TABLET | ORAL | Status: DC | PRN

## 2015-09-08 MED ORDER — ENOXAPARIN SODIUM 40 MG/0.4ML ~~LOC~~ SOLN: 40.0000 mg | SUBCUTANEOUS | Status: DC

## 2015-09-08 MED ORDER — FERROUS SULFATE 325 (65 FE) MG PO TABS: 325.0000 mg | ORAL_TABLET | Freq: Two times a day (BID) | ORAL | Status: DC

## 2015-09-08 MED ORDER — ALPRAZOLAM 0.5 MG PO TABS
ORAL_TABLET | ORAL | Status: DC
Start: 2015-09-08 — End: 2015-10-10

## 2015-09-08 NOTE — Clinical Social Work Note (Signed)
CSW attempted to wake patient 3 times this morning to discuss the fact that he has been discharged to return to [facility] but patient has been soundly sleeping. Joelene Millin at [place] is going to check to see if patient will require reauth even though patient was here at the hospital only under observation for a blood transfusion. DC summary has been sent to [facility]. CSW has updated patient's nurse. Will need to wait to send patient until word from Maudie Mercury at [place] regarding auth.  Shela Leff MSW,LCSW [phone]

## 2015-09-08 NOTE — Discharge Summary (Signed)
[facility] [place] NAME: [name]    MR#:  [id number]  DATE OF BIRTH:  [birth year]  DATE OF ADMISSION:  09/07/2015 ADMITTING PHYSICIAN: Dustin Flock, MD  DATE OF DISCHARGE: 09/08/2015  PRIMARY CARE PHYSICIAN: Viviana Simpler, MD    ADMISSION DIAGNOSIS:  Acute Blood Loss  Anemia  DISCHARGE DIAGNOSIS:  Active Problems:   Anemia   SECONDARY DIAGNOSIS:   Past Medical History  Diagnosis Date  . CAD (coronary artery disease)   . Neurogenic bladder   . Asthma   . Chronic sinusitis   . Diverticulitis   . GERD (gastroesophageal reflux disease)   . Arthritis     rheumatoid  . Cancer [facility])     Prostate  . IBS (irritable bowel syndrome)   . Anxiety   . Neurodermatitis   . ED (erectile dysfunction)   . OSA (obstructive sleep apnea)   . Allergy   . Hypertension   . Depression   . Hx of adenomatous colonic polyps   . History of SIADH   . Hyponatremia   . Chronic kidney disease     permnaent foley catheter  . Anemia     HOSPITAL COURSE:   81 year old male with past medical history significant for coronary artery disease, gastroesophageal reflux disease, history of prostrate cancer, hypertension, SIADH and chronic hyponatremia presents to the hospital from rehabilitation due to acute on chronic anemia.  #1 acute on chronic anemia-symptomatic -due to bleeding from the wound of right knee postoperatively. Bleeding has currently stopped. -Patient did receive 2 units of transfusion on admission and hemoglobin is elevated at 8.9 today -Discharge on iron supplements orally -Outpatient hemoglobin check in 1-2 weeks  #2 hyponatremia-likely worsened from pain. -Known history of chronic hyponatremia and SIADH  #3 recent right knee TKR-management per orthopedics. Appreciate orthopedics consult. -Continue to keep the knee in an extended position, elevated, with  toes pointing to the ceiling. - Outpatient follow-up in 1-2  weeks. -Physical therapy is recommended. -Continue prophylactic Lovenox until seen by orthopedics in 1 week -Continue pain medications as needed  #4 coronary artery disease-continue Toprol and statin  #5 GERD-on Protonix  Patient will be discharged back to acute rehabilitation today to [place]:   Stable  CONSULTS OBTAINED:  Treatment Team:  [name], MD  DRUG ALLERGIES:   Allergies  Allergen Reactions  . Ciprofloxacin Nausea And Vomiting    Headache  . Citalopram Hydrobromide Other (See Comments)    "unknown"  . Lorazepam     Adverse reaction  . Paroxetine Nausea Only  . Ramipril Other (See Comments)    "unknown"  . Simvastatin     DISCHARGE MEDICATIONS:   Current Discharge Medication List    START taking these medications   Details  ferrous sulfate 325 (65 FE) MG tablet Take 1 tablet (325 mg total) by mouth 2 (two) times daily with a meal. Qty: 60 tablet, Refills: 3      CONTINUE these medications which have CHANGED   Details  ALPRAZolam (XANAX) 0.5 MG tablet TAKE TABLET BY MOUTH THREE TIMES DAILY AS NEEDED FOR ANXIETY Qty: 20 tablet, Refills: 0    enoxaparin (LOVENOX) 40 MG/0.4ML injection Inject 0.4 mLs (40 mg total) into the skin daily. Qty: 4 mL, Refills: 0    oxyCODONE (OXY IR/ROXICODONE) 5 MG immediate release tablet Take 1 tablet (5 mg total) by mouth every 4 (four) hours as needed for severe pain or breakthrough pain. Qty:  20 tablet, Refills: 0    traMADol (ULTRAM) 50 MG tablet Take 1-2 tablets (50-100 mg total) by mouth every 4 (four) hours as needed for moderate pain. Qty: 20 tablet, Refills: 0      CONTINUE these medications which have NOT CHANGED   Details  Cholecalciferol (VITAMIN D3) 5000 UNITS CAPS Take 1 capsule by mouth daily.    Coenzyme Q10 (CO Q 10) 100 MG CAPS Take 1 capsule by mouth daily.    Cranberry 425 MG CAPS Take 2 capsules by mouth daily.    fluticasone (FLONASE) 50 MCG/ACT nasal spray Place  2 sprays into both nostrils 2 (two) times daily.    FOLIC ACID PO Take 161 mcg by mouth daily.     furosemide (LASIX) 20 MG tablet Take 20 mg by mouth at bedtime.    gabapentin (NEURONTIN) 300 MG capsule TAKE ONE CAPSULE BY MOUTH THREE TIMES DAILY Qty: 90 capsule, Refills: 3    metoprolol succinate (TOPROL-XL) 50 MG 24 hr tablet TAKE 1 TABLET BY MOUTH EVERY DAY Qty: 90 tablet, Refills: 3    Milk Thistle 250 MG CAPS Take 1 capsule by mouth daily.    Multiple Vitamin (MULTIVITAMIN) tablet Take 1 tablet by mouth daily.      omalizumab (XOLAIR) 150 MG injection Inject into the skin every 14 (fourteen) days.     Omega-3 Fatty Acids (FISH OIL) 1200 MG CAPS Take 2 capsules by mouth daily.    omeprazole (PRILOSEC) 20 MG capsule TAKE 1 CAPSULE BY MOUTH EVERY DAY Qty: 90 capsule, Refills: 3    polyethylene glycol (GLYCOLAX) packet Take 34 g by mouth daily.     pravastatin (PRAVACHOL) 20 MG tablet TAKE 1 TABLET BY MOUTH EVERY DAY Qty: 90 tablet, Refills: 3    Probiotic Product (ALIGN) 4 MG CAPS Take 1 capsule by mouth 2 (two) times daily.    Selenium 200 MCG CAPS Take 1 capsule by mouth daily.    solifenacin (VESICARE) 5 MG tablet Take 5 mg by mouth at bedtime. For bladder spasms    Turmeric 500 MG CAPS Take 500 mg by mouth daily.    vitamin B-12 (CYANOCOBALAMIN) 500 MCG tablet Take 500 mcg by mouth daily.         DISCHARGE INSTRUCTIONS:   1. Orthopedics Follow-up in 1-2 weeks 2. Keep the right leg elevated on 2-3 pillows, keep the knee extended with the toes pointing towards the ceiling 3. PCP follow-up in 1-2 weeks  If you experience worsening of your admission symptoms, develop shortness of breath, life threatening emergency, suicidal or homicidal thoughts you must seek medical attention immediately by calling 911 or calling your MD immediately  if symptoms less severe.  You Must read complete instructions/literature along with all the possible adverse reactions/side effects  for all the Medicines you take and that have been prescribed to you. Take any new Medicines after you have completely understood and accept all the possible adverse reactions/side effects.   Please note  You were cared for by a hospitalist during your hospital stay. If you have any questions about your discharge medications or the care you received while you were in the hospital after you are discharged, you can call the unit and asked to speak with the hospitalist on call if the hospitalist that took care of you is not available. Once you are discharged, your primary care physician will handle any further medical issues. Please note that NO REFILLS for any discharge medications will be authorized once you are  discharged, as it is imperative that you return to your primary care physician (or establish a relationship with a primary care physician if you do not have one) for your aftercare needs so that they can reassess your need for medications and monitor your lab values.    Today   CHIEF COMPLAINT:  No chief complaint on file.   VITAL SIGNS:  Blood pressure 138/40, pulse 77, temperature 98.1 F (36.7 C), temperature source Oral, resp. rate 18, height 5\' 11"  (1.803 m), weight 106.595 kg (235 lb), SpO2 96 %.  I/O:   Intake/Output Summary (Last 24 hours) at 09/08/15 1018 Last data filed at 09/08/15 0955  Gross per 24 hour  Intake   1040 ml  Output    800 ml  Net    240 ml    PHYSICAL EXAMINATION:   Physical Exam  GENERAL:  81 y.o.-year-old patient lying in the bed with no acute distress.  EYES: Pupils equal, round, reactive to light and accommodation. No scleral icterus. Extraocular muscles intact.  HEENT: Head atraumatic, normocephalic. Oropharynx and nasopharynx clear.  NECK:  Supple, no jugular venous distention. No thyroid enlargement, no tenderness.  LUNGS: Normal breath sounds bilaterally, no wheezing, rales,rhonchi or crepitation. No use of accessory muscles of respiration.  Decreased bibasilar breath sounds. CARDIOVASCULAR: S1, S2 normal. No rubs, or gallops. 3/6 systolic murmur is present ABDOMEN: Soft, non-tender, non-distended. Bowel sounds present. No organomegaly or mass.  EXTREMITIES: Left leg is normal without any pedal edema, the right leg has 2+ edema from knee down onto the foot. Wears compression dressing on the right knee. No cyanosis, or clubbing.  NEUROLOGIC: Cranial nerves II through XII are intact. Muscle strength 5/5 in all extremities except the right leg, not tested due to significant pain. Sensation intact. Gait not checked.  PSYCHIATRIC: The patient is alert and oriented x 3.  SKIN: No obvious rash, lesion, or ulcer.   DATA REVIEW:   CBC  Recent Labs Lab 09/08/15 0520  WBC 9.7  HGB 8.9*  HCT 25.4*  PLT 184    Chemistries   Recent Labs Lab 09/08/15 0520  NA 127*  K 3.8  CL 89*  CO2 31  GLUCOSE 113*  BUN 12  CREATININE 0.62  CALCIUM 7.8*    Cardiac Enzymes No results for input(s): TROPONINI in the last 168 hours.  Microbiology Results  Results for orders placed or performed during the hospital encounter of 08/26/15  Surgical pcr screen     Status: None   Collection Time: 08/26/15  3:40 PM  Result Value Ref Range Status   MRSA, PCR NEGATIVE NEGATIVE Final   Staphylococcus aureus NEGATIVE NEGATIVE Final    Comment:        The Xpert SA Assay (FDA approved for NASAL specimens in patients over 21 years of age), is one component of a comprehensive surveillance program.  Test performance has been validated by [facility] for patients greater than or equal to 79 year old. It is not intended to diagnose infection nor to guide or monitor treatment.   Urine culture     Status: None   Collection Time: 08/26/15  3:45 PM  Result Value Ref Range Status   Specimen Description URINE, CATHETERIZED  Final   Special Requests NONE  Final   Culture NO GROWTH 2 DAYS  Final   Report Status 08/28/2015 FINAL  Final     RADIOLOGY:  No results found.  EKG:   Orders placed or performed during the hospital encounter of  08/26/15  . EKG 12-Lead  . EKG 12-Lead      Management plans discussed with the patient, family and they are in agreement.  CODE STATUS:     Code Status Orders        Start     Ordered   09/07/15 1506  Full code   Continuous     09/07/15 1505    Advance Directive Documentation        Most Recent Value   Type of Advance Directive  Healthcare Power of Attorney, Living will   Pre-existing out of facility DNR order (yellow form or pink MOST form)     "MOST" Form in Place?        TOTAL TIME TAKING CARE OF THIS PATIENT: 37 minutes.    Gladstone Lighter M.D on 09/08/2015 at 10:18 AM  Between 7am to 6pm - Pager - [phone]  After 6pm go to [email or web address] - password [email or web address] [place] [facility]  Office  [phone]  CC: Primary care physician; Viviana Simpler, MD

## 2015-09-08 NOTE — Progress Notes (Signed)
Report called to [facility]. Pt IV removed and pt transported to facility via EMS.  Clarise Cruz, RN

## 2015-09-08 NOTE — Consult Note (Signed)
Orthopaedics- Patient awake and alert but drowsy. Wife and daughter are present. AF, VSS. Hgb 6.9 Right lower extremity was evaluated. Compression dressing is dry and intact to the right knee. No evidence of active bleeding. Neurovascular function is intact. Agree with plans for transfusion. Order placed for continuation of PolarCare to right knee. Will reassess the surgical site tomorrow.  Briea Mcenery P. Holley Bouche M.D.

## 2015-09-08 NOTE — Clinical Social Work Note (Signed)
Kim with [facility] has informed CSW that patient has been approved to return to their facility by [facility]. Patient's nurse updated. Patient's nurse stated patient had been given a pain med and an anti-anxiety med this morning and this is the reason he is sleeping heavily. Patient's nurse was able to wake patient long enough to be informed that patient wishes to transport via EMS. Shela Leff MSW,LCSW [phone]

## 2015-09-08 NOTE — Plan of Care (Signed)
Problem: Discharge Progression Outcomes Goal: Other Discharge Outcomes/Goals Outcome: Progressing Plan of care progress to goals: 1. C/o right leg pain relieved by PRN Oxycodone  2. Hemodynamically:             -VSS, afebrile              -received 2 units pRBC w/o problem, tolerated well             -polar care applied & maintained              -chronic foley catheter, good output 3. Tolerating 2 gram sodium diet, good appetite  4. +2 maximum assist, pt has remained in bed throughout the night.

## 2015-09-08 NOTE — Consult Note (Signed)
Orthopaedics- Patient sleeping but arousable. VSS Post transfusion Hgb = 8.9 Right knee dressing changed. No drainage on dressing. Moderate amount of swelling to the knee but no erythema. Light compression dressing reapplied followed by TED hose. Should be OK to return to [facility] today.  James P. Holley Bouche M.D.

## 2015-09-09 LAB — TYPE AND SCREEN
ABO/RH(D): A POS
Antibody Screen: NEGATIVE
Unit division: 0
Unit division: 0

## 2015-09-10 DIAGNOSIS — E871 Hypo-osmolality and hyponatremia: Secondary | ICD-10-CM | POA: Diagnosis not present

## 2015-09-10 DIAGNOSIS — D649 Anemia, unspecified: Secondary | ICD-10-CM | POA: Diagnosis not present

## 2015-09-10 LAB — COMPREHENSIVE METABOLIC PANEL
ALT: 29 U/L (ref 17–63)
ANION GAP: 6 (ref 5–15)
AST: 36 U/L (ref 15–41)
Albumin: 2.5 g/dL — ABNORMAL LOW (ref 3.5–5.0)
Alkaline Phosphatase: 44 U/L (ref 38–126)
BUN: 14 mg/dL (ref 6–20)
CHLORIDE: 92 mmol/L — AB (ref 101–111)
CO2: 29 mmol/L (ref 22–32)
Calcium: 7.9 mg/dL — ABNORMAL LOW (ref 8.9–10.3)
Creatinine, Ser: 0.53 mg/dL — ABNORMAL LOW (ref 0.61–1.24)
Glucose, Bld: 139 mg/dL — ABNORMAL HIGH (ref 65–99)
POTASSIUM: 4.1 mmol/L (ref 3.5–5.1)
SODIUM: 127 mmol/L — AB (ref 135–145)
Total Bilirubin: 0.7 mg/dL (ref 0.3–1.2)
Total Protein: 4.9 g/dL — ABNORMAL LOW (ref 6.5–8.1)

## 2015-09-10 LAB — CBC WITH DIFFERENTIAL/PLATELET
Basophils Absolute: 0 10*3/uL (ref 0–0.1)
Basophils Relative: 1 %
EOS ABS: 0.7 10*3/uL (ref 0–0.7)
EOS PCT: 8 %
HCT: 27.8 % — ABNORMAL LOW (ref 40.0–52.0)
Hemoglobin: 9.5 g/dL — ABNORMAL LOW (ref 13.0–18.0)
LYMPHS ABS: 0.9 10*3/uL — AB (ref 1.0–3.6)
Lymphocytes Relative: 11 %
MCH: 30.8 pg (ref 26.0–34.0)
MCHC: 34.2 g/dL (ref 32.0–36.0)
MCV: 90 fL (ref 80.0–100.0)
MONO ABS: 0.7 10*3/uL (ref 0.2–1.0)
Monocytes Relative: 9 %
Neutro Abs: 5.9 10*3/uL (ref 1.4–6.5)
Neutrophils Relative %: 71 %
PLATELETS: 230 10*3/uL (ref 150–440)
RBC: 3.09 MIL/uL — AB (ref 4.40–5.90)
RDW: 16.7 % — AB (ref 11.5–14.5)
WBC: 8.2 10*3/uL (ref 3.8–10.6)

## 2015-09-17 DIAGNOSIS — R600 Localized edema: Secondary | ICD-10-CM | POA: Diagnosis not present

## 2015-09-29 ENCOUNTER — Ambulatory Visit: Payer: Medicare PPO

## 2015-10-02 DIAGNOSIS — N319 Neuromuscular dysfunction of bladder, unspecified: Secondary | ICD-10-CM | POA: Diagnosis not present

## 2015-10-05 DIAGNOSIS — M6281 Muscle weakness (generalized): Secondary | ICD-10-CM | POA: Diagnosis not present

## 2015-10-07 DIAGNOSIS — Z96651 Presence of right artificial knee joint: Secondary | ICD-10-CM | POA: Diagnosis not present

## 2015-10-08 ENCOUNTER — Ambulatory Visit (INDEPENDENT_AMBULATORY_CARE_PROVIDER_SITE_OTHER): Payer: Medicare PPO

## 2015-10-08 DIAGNOSIS — J4540 Moderate persistent asthma, uncomplicated: Secondary | ICD-10-CM | POA: Diagnosis not present

## 2015-10-09 DIAGNOSIS — Z96651 Presence of right artificial knee joint: Secondary | ICD-10-CM | POA: Diagnosis not present

## 2015-10-09 MED ORDER — OMALIZUMAB 150 MG ~~LOC~~ SOLR
375.0000 mg | Freq: Once | SUBCUTANEOUS | Status: AC
  Administered 2015-10-08: 375 mg via SUBCUTANEOUS

## 2015-10-10 ENCOUNTER — Other Ambulatory Visit: Payer: Self-pay | Admitting: Internal Medicine

## 2015-10-12 ENCOUNTER — Telehealth: Payer: Self-pay

## 2015-10-12 DIAGNOSIS — Z96651 Presence of right artificial knee joint: Secondary | ICD-10-CM | POA: Diagnosis not present

## 2015-10-12 NOTE — Telephone Encounter (Signed)
#   Vials:6 Arrival Date:10/12/2015 Lot #: [id number] (X2), [id number] (X4) Exp Date:11/2016 (X2), 11/2017 (X4)

## 2015-10-12 NOTE — Telephone Encounter (Signed)
09/08/2015 

## 2015-10-12 NOTE — Telephone Encounter (Signed)
Approved: #[id number] x 0 

## 2015-10-12 NOTE — Telephone Encounter (Signed)
rx called into pharmacy

## 2015-10-13 DIAGNOSIS — Z96651 Presence of right artificial knee joint: Secondary | ICD-10-CM | POA: Diagnosis not present

## 2015-10-14 DIAGNOSIS — Z96651 Presence of right artificial knee joint: Secondary | ICD-10-CM | POA: Diagnosis not present

## 2015-10-16 DIAGNOSIS — Z96651 Presence of right artificial knee joint: Secondary | ICD-10-CM | POA: Diagnosis not present

## 2015-10-19 DIAGNOSIS — Z96651 Presence of right artificial knee joint: Secondary | ICD-10-CM | POA: Diagnosis not present

## 2015-10-21 DIAGNOSIS — Z96651 Presence of right artificial knee joint: Secondary | ICD-10-CM | POA: Diagnosis not present

## 2015-10-26 ENCOUNTER — Ambulatory Visit: Payer: Medicare PPO

## 2015-10-26 DIAGNOSIS — Z96651 Presence of right artificial knee joint: Secondary | ICD-10-CM | POA: Diagnosis not present

## 2015-10-27 ENCOUNTER — Telehealth: Payer: Self-pay | Admitting: Internal Medicine

## 2015-10-27 ENCOUNTER — Ambulatory Visit (INDEPENDENT_AMBULATORY_CARE_PROVIDER_SITE_OTHER): Payer: Medicare PPO

## 2015-10-27 DIAGNOSIS — J4540 Moderate persistent asthma, uncomplicated: Secondary | ICD-10-CM

## 2015-10-27 NOTE — Telephone Encounter (Signed)
Pt dropped off a renewal disability placard form that needs to be signed. You can call [name] (daughter) at [phone] when completed. Placed ppw in Dr. [facility] rx tower.

## 2015-10-28 DIAGNOSIS — Z96651 Presence of right artificial knee joint: Secondary | ICD-10-CM | POA: Diagnosis not present

## 2015-10-28 MED ORDER — OMALIZUMAB 150 MG ~~LOC~~ SOLR
375.0000 mg | Freq: Once | SUBCUTANEOUS | Status: AC
  Administered 2015-10-27: 375 mg via SUBCUTANEOUS

## 2015-10-28 NOTE — Telephone Encounter (Signed)
Form on your desk  

## 2015-10-28 NOTE — Telephone Encounter (Signed)
I left a message on [name]'s voice mail that form is ready for pick up.

## 2015-10-28 NOTE — Telephone Encounter (Signed)
Form done No charge 

## 2015-10-30 DIAGNOSIS — N319 Neuromuscular dysfunction of bladder, unspecified: Secondary | ICD-10-CM | POA: Diagnosis not present

## 2015-11-02 DIAGNOSIS — Z96651 Presence of right artificial knee joint: Secondary | ICD-10-CM | POA: Diagnosis not present

## 2015-11-05 ENCOUNTER — Ambulatory Visit: Payer: Medicare PPO | Admitting: Internal Medicine

## 2015-11-06 DIAGNOSIS — Z96651 Presence of right artificial knee joint: Secondary | ICD-10-CM | POA: Diagnosis not present

## 2015-11-07 ENCOUNTER — Other Ambulatory Visit: Payer: Self-pay | Admitting: Internal Medicine

## 2015-11-09 NOTE — Telephone Encounter (Signed)
rx called into pharmacy

## 2015-11-09 NOTE — Telephone Encounter (Signed)
Approved: #[id number] x 0 

## 2015-11-09 NOTE — Telephone Encounter (Signed)
10/12/15  

## 2015-11-10 DIAGNOSIS — Z96651 Presence of right artificial knee joint: Secondary | ICD-10-CM | POA: Diagnosis not present

## 2015-11-10 DIAGNOSIS — M25561 Pain in right knee: Secondary | ICD-10-CM | POA: Diagnosis not present

## 2015-11-11 ENCOUNTER — Ambulatory Visit (INDEPENDENT_AMBULATORY_CARE_PROVIDER_SITE_OTHER): Payer: Medicare PPO

## 2015-11-11 ENCOUNTER — Telehealth: Payer: Self-pay

## 2015-11-11 DIAGNOSIS — J4520 Mild intermittent asthma, uncomplicated: Secondary | ICD-10-CM

## 2015-11-11 NOTE — Telephone Encounter (Signed)
Patient would like to talk to you about his insurance. He said that he would like to avoid the "problem" he had last year. Please call him when you get a chance.

## 2015-11-12 DIAGNOSIS — N319 Neuromuscular dysfunction of bladder, unspecified: Secondary | ICD-10-CM | POA: Diagnosis not present

## 2015-11-12 NOTE — Telephone Encounter (Signed)
Pt wanted to let me know that it appears time to renew/update his PA for Xolair for 2017. He states he rec'd a letter stating that [place] was approved from 02-2015 to 02-2016.   He then states that he could not have PA in the middle of the year as it would affect his out of pocket expenses. Pt is aware that PA approves the medication and that each year his "benefits"/"deductible" starts over.    Nothing more needed at this time.

## 2015-11-13 MED ORDER — OMALIZUMAB 150 MG ~~LOC~~ SOLR
375.0000 mg | Freq: Once | SUBCUTANEOUS | Status: AC
  Administered 2015-11-13: 375 mg via SUBCUTANEOUS

## 2015-11-17 ENCOUNTER — Telehealth: Payer: Self-pay | Admitting: Pulmonary Disease

## 2015-11-17 ENCOUNTER — Ambulatory Visit (INDEPENDENT_AMBULATORY_CARE_PROVIDER_SITE_OTHER): Payer: Medicare PPO | Admitting: Internal Medicine

## 2015-11-17 ENCOUNTER — Encounter: Payer: Self-pay | Admitting: Internal Medicine

## 2015-11-17 VITALS — BP 120/68 | HR 68 | Temp 98.3°F | Wt 238.0 lb

## 2015-11-17 DIAGNOSIS — W19XXXA Unspecified fall, initial encounter: Secondary | ICD-10-CM | POA: Diagnosis not present

## 2015-11-17 DIAGNOSIS — D500 Iron deficiency anemia secondary to blood loss (chronic): Secondary | ICD-10-CM

## 2015-11-17 DIAGNOSIS — S20212A Contusion of left front wall of thorax, initial encounter: Secondary | ICD-10-CM | POA: Insufficient documentation

## 2015-11-17 DIAGNOSIS — S298XXA Other specified injuries of thorax, initial encounter: Secondary | ICD-10-CM | POA: Insufficient documentation

## 2015-11-17 LAB — CBC WITH DIFFERENTIAL/PLATELET
BASOS ABS: 0 10*3/uL (ref 0.0–0.1)
BASOS PCT: 0.5 % (ref 0.0–3.0)
EOS ABS: 0.3 10*3/uL (ref 0.0–0.7)
Eosinophils Relative: 5.1 % — ABNORMAL HIGH (ref 0.0–5.0)
HEMATOCRIT: 39.3 % (ref 39.0–52.0)
HEMOGLOBIN: 13.2 g/dL (ref 13.0–17.0)
LYMPHS PCT: 17.5 % (ref 12.0–46.0)
Lymphs Abs: 1.1 10*3/uL (ref 0.7–4.0)
MCHC: 33.6 g/dL (ref 30.0–36.0)
MCV: 93.3 fl (ref 78.0–100.0)
MONOS PCT: 6.9 % (ref 3.0–12.0)
Monocytes Absolute: 0.4 10*3/uL (ref 0.1–1.0)
Neutro Abs: 4.5 10*3/uL (ref 1.4–7.7)
Neutrophils Relative %: 70 % (ref 43.0–77.0)
Platelets: 165 10*3/uL (ref 150.0–400.0)
RBC: 4.21 Mil/uL — ABNORMAL LOW (ref 4.22–5.81)
RDW: 15.3 % (ref 11.5–15.5)
WBC: 6.4 10*3/uL (ref 4.0–10.5)

## 2015-11-17 LAB — RENAL FUNCTION PANEL
Albumin: 3.6 g/dL (ref 3.5–5.2)
BUN: 16 mg/dL (ref 6–23)
CHLORIDE: 103 meq/L (ref 96–112)
CO2: 31 mEq/L (ref 19–32)
Calcium: 9.1 mg/dL (ref 8.4–10.5)
Creatinine, Ser: 0.61 mg/dL (ref 0.40–1.50)
GFR: 135.36 mL/min (ref >60.00)
Glucose, Bld: 86 mg/dL (ref 70–99)
PHOSPHORUS: 3.8 mg/dL (ref 2.3–4.6)
Potassium: 4 mEq/L (ref 3.5–5.1)
SODIUM: 141 meq/L (ref 135–145)

## 2015-11-17 NOTE — Assessment & Plan Note (Signed)
Post op bleeding Will recheck

## 2015-11-17 NOTE — Assessment & Plan Note (Signed)
Discussed safety and using walker

## 2015-11-17 NOTE — Assessment & Plan Note (Signed)
No evidence of serious injury

## 2015-11-17 NOTE — Progress Notes (Signed)
Pre visit review using our clinic review tool, if applicable. No additional management support is needed unless otherwise documented below in the visit note. 

## 2015-11-17 NOTE — Progress Notes (Signed)
Subjective:    Patient ID: [name], male    DOB: December 05, 1933, 81 y.o.   MRN: [id number]  HPI Here  with daughter for follow up on TKR [name]) She now lives in town He and wife hope to sell home and down size  He is here for routine follow up after his surgery Home from rehab for about 6 weeks Did have excessive bleeding and needed transfusion---finally has overcome all this This delayed therapy Dr [name] is pleased with the result Now done with PT---had been going to [facility] Has HEP--sounds like he is not consistent with this  Flare ups with bladder problems--this limited his exercises Had botox for bladder last week--this has helped  Did have a fall about 2 weeks ago Was standing to put on pants---and leaned against a wall Fell onto left side and hit ribs and chest and left arm Still with some rib pain Was seen by Dr [name] 2-3 days   Also concerned about right foot Has some pain when shoes are on  Current Outpatient Prescriptions on File Prior to Visit  Medication Sig Dispense Refill  . ALPRAZolam (XANAX) 0.5 MG tablet TAKE 1 TABLET BY MOUTH THREE TIMES DAILY AS NEEDED FOR ANXIETY 90 tablet 0  . Cholecalciferol (VITAMIN D3) 5000 UNITS CAPS Take 1 capsule by mouth daily.    . Coenzyme Q10 (CO Q 10) 100 MG CAPS Take 1 capsule by mouth daily.    . Cranberry 425 MG CAPS Take 2 capsules by mouth daily.    . ferrous sulfate 325 (65 FE) MG tablet Take 1 tablet (325 mg total) by mouth 2 (two) times daily with a meal. 60 tablet 3  . fluticasone (FLONASE) 50 MCG/ACT nasal spray Place 2 sprays into both nostrils 2 (two) times daily.    Marland Kitchen FOLIC ACID PO Take [id number] mcg by mouth daily.     . furosemide (LASIX) 20 MG tablet Take 20 mg by mouth at bedtime.    . gabapentin (NEURONTIN) 300 MG capsule TAKE ONE CAPSULE BY MOUTH THREE TIMES DAILY 90 capsule 3  . metoprolol succinate (TOPROL-XL) 50 MG 24 hr tablet TAKE 1 TABLET BY MOUTH EVERY DAY 90 tablet 3  . Milk Thistle 250 MG  CAPS Take 1 capsule by mouth daily.    . Multiple Vitamin (MULTIVITAMIN) tablet Take 1 tablet by mouth daily.      Marland Kitchen omalizumab (XOLAIR) 150 MG injection Inject into the skin every 14 (fourteen) days.     . Omega-3 Fatty Acids (FISH OIL) 1200 MG CAPS Take 2 capsules by mouth daily.    Marland Kitchen omeprazole (PRILOSEC) 20 MG capsule TAKE 1 CAPSULE BY MOUTH EVERY DAY 90 capsule 3  . oxyCODONE (OXY IR/ROXICODONE) 5 MG immediate release tablet Take 1 tablet (5 mg total) by mouth every 4 (four) hours as needed for severe pain or breakthrough pain. 20 tablet 0  . polyethylene glycol (GLYCOLAX) packet Take 34 g by mouth daily.     . pravastatin (PRAVACHOL) 20 MG tablet TAKE 1 TABLET BY MOUTH EVERY DAY 90 tablet 3  . Probiotic Product (ALIGN) 4 MG CAPS Take 1 capsule by mouth 2 (two) times daily.    . Selenium 200 MCG CAPS Take 1 capsule by mouth daily.    . solifenacin (VESICARE) 5 MG tablet Take 5 mg by mouth at bedtime. For bladder spasms    . traMADol (ULTRAM) 50 MG tablet Take 1-2 tablets (50-100 mg total) by mouth every 4 (four) hours  as needed for moderate pain. 20 tablet 0  . Turmeric 500 MG CAPS Take 500 mg by mouth daily.    . vitamin B-12 (CYANOCOBALAMIN) 500 MCG tablet Take 500 mcg by mouth daily.     No current facility-administered medications on file prior to visit.    Allergies  Allergen Reactions  . Ciprofloxacin Nausea And Vomiting    Headache  . Citalopram Hydrobromide Other (See Comments)    "unknown"  . Lorazepam     Adverse reaction  . Paroxetine Nausea Only  . Ramipril Other (See Comments)    "unknown"  . Simvastatin     Past Medical History  Diagnosis Date  . CAD (coronary artery disease)   . Neurogenic bladder   . Asthma   . Chronic sinusitis   . Diverticulitis   . GERD (gastroesophageal reflux disease)   . Arthritis     rheumatoid  . Cancer [facility])     Prostate  . IBS (irritable bowel syndrome)   . Anxiety   . Neurodermatitis   . ED (erectile dysfunction)   . OSA  (obstructive sleep apnea)   . Allergy   . Hypertension   . Depression   . Hx of adenomatous colonic polyps   . History of SIADH   . Hyponatremia   . Chronic kidney disease     permnaent foley catheter  . Anemia     Past Surgical History  Procedure Laterality Date  . Prostate surgery      PROSTATECTOMY  . Knee surgery      ARTHROSCOPY LEFT  . Nasal sinus surgery  2009    DEVIATED SEPTUM AND POLYPS  . Urethral stricture dilatation  02-2010    Dr.Cope  . Total knee arthroplasty Left 9/15    Dr [name]  . Joint replacement    . Knee arthroplasty Right 09/02/2015    Procedure: COMPUTER ASSISTED TOTAL KNEE ARTHROPLASTY;  Surgeon: [name], MD;  Location: [facility] ORS;  Service: Orthopedics;  Laterality: Right;    Family History  Problem Relation Age of Onset  . Heart disease Mother 56    heart failure  . Pneumonia Father 11    pnemonia    Social History   Social History  . Marital Status: Married    Spouse Name: N/A  . Number of Children: 2  . Years of Education: N/A   Occupational History  . Radio station owner     retired   Social History Main Topics  . Smoking status: Never Smoker   . Smokeless tobacco: Never Used  . Alcohol Use: Yes     Comment: 4-5 nights/week  . Drug Use: No  . Sexual Activity: Not on file   Other Topics Concern  . Not on file   Social History Narrative   Not sure about a living will or health care POA   Wife should make health care decisions for him   Would accept resuscitation attempts   Not sure about tube feeds   Review of Systems No sig SOB--unless he takes a really big breath No cough or fever    Objective:   Physical Exam  Neck: Normal range of motion. No thyromegaly present.  Cardiovascular: Normal rate, regular rhythm and normal heart sounds.  Exam reveals no gallop.   No murmur heard. Pulmonary/Chest: No respiratory distress. He has no wheezes. He has no rales.  Bruising under left breast--with slight rib  tenderness Decreased breath sounds at both bases but no clear dullness  Musculoskeletal:  Slight bruising and tenderness at base of right 2nd toe 1-2+ edema ---- left>right  Lymphadenopathy:    He has no cervical adenopathy.  Psychiatric: He has a normal mood and affect. His behavior is normal.          Assessment & Plan:

## 2015-11-17 NOTE — Telephone Encounter (Addendum)
#   vials:6 Ordered date: 11/17/15 Was unable to order until today 01/19/15. Shipping Date:11/19/15

## 2015-11-20 DIAGNOSIS — R339 Retention of urine, unspecified: Secondary | ICD-10-CM | POA: Diagnosis not present

## 2015-11-20 DIAGNOSIS — N319 Neuromuscular dysfunction of bladder, unspecified: Secondary | ICD-10-CM | POA: Diagnosis not present

## 2015-11-24 NOTE — Telephone Encounter (Signed)
#   Vials:6 Arrival Date:11/24/15 Shipped later because of the holiday. Lot [id number] Exp Date:6/20

## 2015-11-26 ENCOUNTER — Telehealth: Payer: Self-pay | Admitting: Pulmonary Disease

## 2015-11-26 ENCOUNTER — Ambulatory Visit (INDEPENDENT_AMBULATORY_CARE_PROVIDER_SITE_OTHER): Payer: Medicare PPO

## 2015-11-26 DIAGNOSIS — Z23 Encounter for immunization: Secondary | ICD-10-CM

## 2015-11-26 DIAGNOSIS — J4540 Moderate persistent asthma, uncomplicated: Secondary | ICD-10-CM

## 2015-11-26 NOTE — Telephone Encounter (Signed)
Spoke with pt while in office, states he is concerned that he has not heard about his copay assistance for Xolair for the upcoming year.  Pt requesting to speak directly with [name] regarding this issue.  I advised pt that Joellen Jersey is seeing patients today but will be in contact with him for an update on his xolair account.  [name] please advise.  thanks

## 2015-11-26 NOTE — Telephone Encounter (Signed)
Pt is aware that it will later for a call back to work out details.

## 2015-11-27 MED ORDER — OMALIZUMAB 150 MG ~~LOC~~ SOLR
375.0000 mg | Freq: Once | SUBCUTANEOUS | Status: AC
  Administered 2015-11-26: 375 mg via SUBCUTANEOUS

## 2015-12-02 ENCOUNTER — Telehealth: Payer: Self-pay | Admitting: Internal Medicine

## 2015-12-02 NOTE — Telephone Encounter (Signed)
Pt is having trouble with mychart. Can you mail his December labs to him?  Thank you

## 2015-12-02 NOTE — Telephone Encounter (Signed)
Labs printed and mailed as requested.

## 2015-12-08 ENCOUNTER — Other Ambulatory Visit: Payer: Self-pay | Admitting: Internal Medicine

## 2015-12-08 NOTE — Telephone Encounter (Signed)
rx called into pharmacy

## 2015-12-08 NOTE — Telephone Encounter (Signed)
11/09/2015 

## 2015-12-08 NOTE — Telephone Encounter (Signed)
Approved: #[id number] x 0 

## 2015-12-10 DIAGNOSIS — M0579 Rheumatoid arthritis with rheumatoid factor of multiple sites without organ or systems involvement: Secondary | ICD-10-CM | POA: Diagnosis not present

## 2015-12-11 ENCOUNTER — Ambulatory Visit: Payer: Medicare PPO

## 2015-12-11 DIAGNOSIS — N319 Neuromuscular dysfunction of bladder, unspecified: Secondary | ICD-10-CM | POA: Diagnosis not present

## 2015-12-14 ENCOUNTER — Telehealth: Payer: Self-pay | Admitting: Internal Medicine

## 2015-12-14 ENCOUNTER — Ambulatory Visit: Payer: Medicare PPO

## 2015-12-15 ENCOUNTER — Ambulatory Visit (INDEPENDENT_AMBULATORY_CARE_PROVIDER_SITE_OTHER): Payer: Medicare PPO | Admitting: Internal Medicine

## 2015-12-15 ENCOUNTER — Encounter: Payer: Self-pay | Admitting: Internal Medicine

## 2015-12-15 VITALS — BP 132/70 | HR 68 | Temp 98.2°F | Wt 230.0 lb

## 2015-12-15 DIAGNOSIS — R194 Change in bowel habit: Secondary | ICD-10-CM

## 2015-12-15 NOTE — Progress Notes (Signed)
Pre visit review using our clinic review tool, if applicable. No additional management support is needed unless otherwise documented below in the visit note. 

## 2015-12-15 NOTE — Telephone Encounter (Signed)
Forwarding to [employer].  Please advise.  Thanks.

## 2015-12-15 NOTE — Progress Notes (Signed)
Subjective:    Patient ID: [name], male    DOB: 1934-01-20, 82 y.o.   MRN: [id number]  HPI Here with wife due to ongoing concerns about his bowels  He can't go without straining Will have urgency to go--but then can't----- couple of times a day Then will find smears of stool on toilet paper. No soiling of underwear Did try dulcolax tab--didn't work. Then tried 2 --then he wound up having incontinent bowel ("a huge explosion") Had 1 other incontinent episode--when at last PT visit  Stools are thin in caliber No blood in stool Takes probiotic, yogurt and miralax daily (1 full capful)  Current Outpatient Prescriptions on File Prior to Visit  Medication Sig Dispense Refill  . ALPRAZolam (XANAX) 0.5 MG tablet TAKE 1 TABLET BY MOUTH THREE TIMES DAILY AS NEEDED FOR ANXIETY 90 tablet 0  . Cholecalciferol (VITAMIN D3) 5000 UNITS CAPS Take 1 capsule by mouth daily.    . Coenzyme Q10 (CO Q 10) 100 MG CAPS Take 1 capsule by mouth daily.    . Cranberry 425 MG CAPS Take 2 capsules by mouth daily.    . ferrous sulfate 325 (65 FE) MG tablet Take 1 tablet (325 mg total) by mouth 2 (two) times daily with a meal. 60 tablet 3  . fluticasone (FLONASE) 50 MCG/ACT nasal spray Place 2 sprays into both nostrils 2 (two) times daily.    Marland Kitchen FOLIC ACID PO Take [id number] mcg by mouth daily.     . furosemide (LASIX) 20 MG tablet Take 20 mg by mouth at bedtime.    . gabapentin (NEURONTIN) 300 MG capsule TAKE ONE CAPSULE BY MOUTH THREE TIMES DAILY 90 capsule 3  . metoprolol succinate (TOPROL-XL) 50 MG 24 hr tablet TAKE 1 TABLET BY MOUTH EVERY DAY 90 tablet 3  . Milk Thistle 250 MG CAPS Take 1 capsule by mouth daily.    . Multiple Vitamin (MULTIVITAMIN) tablet Take 1 tablet by mouth daily.      . Omega-3 Fatty Acids (FISH OIL) 1200 MG CAPS Take 2 capsules by mouth daily.    Marland Kitchen omeprazole (PRILOSEC) 20 MG capsule TAKE 1 CAPSULE BY MOUTH EVERY DAY 90 capsule 3  . oxyCODONE (OXY IR/ROXICODONE) 5 MG immediate release  tablet Take 1 tablet (5 mg total) by mouth every 4 (four) hours as needed for severe pain or breakthrough pain. 20 tablet 0  . polyethylene glycol (GLYCOLAX) packet Take 34 g by mouth daily.     . pravastatin (PRAVACHOL) 20 MG tablet TAKE 1 TABLET BY MOUTH EVERY DAY 90 tablet 3  . Probiotic Product (ALIGN) 4 MG CAPS Take 1 capsule by mouth 2 (two) times daily.    . Selenium 200 MCG CAPS Take 1 capsule by mouth daily.    . solifenacin (VESICARE) 5 MG tablet Take 5 mg by mouth at bedtime. For bladder spasms    . traMADol (ULTRAM) 50 MG tablet Take 1-2 tablets (50-100 mg total) by mouth every 4 (four) hours as needed for moderate pain. 20 tablet 0  . Turmeric 500 MG CAPS Take 500 mg by mouth daily.    . vitamin B-12 (CYANOCOBALAMIN) 500 MCG tablet Take 500 mcg by mouth daily.     No current facility-administered medications on file prior to visit.    Allergies  Allergen Reactions  . Ciprofloxacin Nausea And Vomiting    Headache  . Citalopram Hydrobromide Other (See Comments)    "unknown"  . Lorazepam     Adverse reaction  .  Paroxetine Nausea Only  . Ramipril Other (See Comments)    "unknown"  . Simvastatin     Past Medical History  Diagnosis Date  . CAD (coronary artery disease)   . Neurogenic bladder   . Asthma   . Chronic sinusitis   . Diverticulitis   . GERD (gastroesophageal reflux disease)   . Arthritis     rheumatoid  . Cancer [facility])     Prostate  . IBS (irritable bowel syndrome)   . Anxiety   . Neurodermatitis   . ED (erectile dysfunction)   . OSA (obstructive sleep apnea)   . Allergy   . Hypertension   . Depression   . Hx of adenomatous colonic polyps   . History of SIADH   . Hyponatremia   . Chronic kidney disease     permnaent foley catheter  . Anemia     Past Surgical History  Procedure Laterality Date  . Prostate surgery      PROSTATECTOMY  . Knee surgery      ARTHROSCOPY LEFT  . Nasal sinus surgery  2009    DEVIATED SEPTUM AND POLYPS  . Urethral  stricture dilatation  02-2010    Dr.Cope  . Total knee arthroplasty Left 9/15    Dr [name]  . Joint replacement    . Knee arthroplasty Right 09/02/2015    Procedure: COMPUTER ASSISTED TOTAL KNEE ARTHROPLASTY;  Surgeon: [name], MD;  Location: [facility] ORS;  Service: Orthopedics;  Laterality: Right;    Family History  Problem Relation Age of Onset  . Heart disease Mother 13    heart failure  . Pneumonia Father 83    pnemonia    Social History   Social History  . Marital Status: Married    Spouse Name: N/A  . Number of Children: 2  . Years of Education: N/A   Occupational History  . Radio station owner     retired   Social History Main Topics  . Smoking status: Never Smoker   . Smokeless tobacco: Never Used  . Alcohol Use: Yes     Comment: 4-5 nights/week  . Drug Use: No  . Sexual Activity: Not on file   Other Topics Concern  . Not on file   Social History Narrative   Not sure about a living will or health care POA   Wife should make health care decisions for him   Would accept resuscitation attempts   Not sure about tube feeds   Review of Systems Very anxious about his bowels, etc Worried about colon cancer    Objective:   Physical Exam  Pulmonary/Chest: Effort normal. No respiratory distress. He has no wheezes. He has no rales.          Assessment & Plan:

## 2015-12-15 NOTE — Assessment & Plan Note (Signed)
Still constipated miralax not working alone 1 dulcolax didn't work, 2 was too much Constantly straining May be related to chronic Foley need Change in stool caliber and almost 10 years since colon--will set this up

## 2015-12-15 NOTE — Patient Instructions (Signed)
Please add senna-S 1 or 2 tablets daily to see if your bowels will be more regular. Do not strain--sit on the toilet for 5 minutes after breakfast (and 1 other meal if you want)--if you go, fine---if not, leave and try again another time.

## 2015-12-18 NOTE — Telephone Encounter (Signed)
Form in [employer].  Please advise when this has been completed.  Thanks.

## 2015-12-23 ENCOUNTER — Telehealth: Payer: Self-pay | Admitting: Pulmonary Disease

## 2015-12-23 NOTE — Telephone Encounter (Signed)
#   vials:6 Ordered date:12/23/15 Shipping Date:12/24/15

## 2015-12-23 NOTE — Telephone Encounter (Signed)
Spoke with patient-he is aware that the forms he left for me is the PA information that we have on file as well. Pt is aware that we will take great care of him and seek a re-PA prior to his current PA running out April 21,2017. Pt has no further questions/concerns at this time.

## 2015-12-23 NOTE — Telephone Encounter (Signed)
[name] has this form been completed? Thanks.

## 2015-12-24 NOTE — Telephone Encounter (Signed)
#   Vials:6 Arrival [id number] Lot #:[id number] Exp Date:6/20

## 2015-12-29 ENCOUNTER — Ambulatory Visit (INDEPENDENT_AMBULATORY_CARE_PROVIDER_SITE_OTHER): Payer: Medicare PPO

## 2015-12-29 DIAGNOSIS — J4540 Moderate persistent asthma, uncomplicated: Secondary | ICD-10-CM

## 2015-12-30 MED ORDER — OMALIZUMAB 150 MG ~~LOC~~ SOLR
375.0000 mg | Freq: Once | SUBCUTANEOUS | Status: AC
  Administered 2015-12-29: 375 mg via SUBCUTANEOUS

## 2016-01-04 ENCOUNTER — Other Ambulatory Visit: Payer: Self-pay | Admitting: Internal Medicine

## 2016-01-04 NOTE — Telephone Encounter (Signed)
Approved: #[id number] x 0 

## 2016-01-04 NOTE — Telephone Encounter (Signed)
12/08/2015 

## 2016-01-04 NOTE — Telephone Encounter (Signed)
rx called into pharmacy

## 2016-01-05 DIAGNOSIS — Z96651 Presence of right artificial knee joint: Secondary | ICD-10-CM | POA: Diagnosis not present

## 2016-01-05 DIAGNOSIS — M5416 Radiculopathy, lumbar region: Secondary | ICD-10-CM | POA: Diagnosis not present

## 2016-01-07 DIAGNOSIS — M0579 Rheumatoid arthritis with rheumatoid factor of multiple sites without organ or systems involvement: Secondary | ICD-10-CM | POA: Diagnosis not present

## 2016-01-07 DIAGNOSIS — M25512 Pain in left shoulder: Secondary | ICD-10-CM | POA: Diagnosis not present

## 2016-01-07 DIAGNOSIS — M25511 Pain in right shoulder: Secondary | ICD-10-CM | POA: Diagnosis not present

## 2016-01-07 DIAGNOSIS — G8929 Other chronic pain: Secondary | ICD-10-CM | POA: Diagnosis not present

## 2016-01-08 DIAGNOSIS — N319 Neuromuscular dysfunction of bladder, unspecified: Secondary | ICD-10-CM | POA: Diagnosis not present

## 2016-01-10 ENCOUNTER — Other Ambulatory Visit: Payer: Self-pay | Admitting: Internal Medicine

## 2016-01-11 NOTE — Telephone Encounter (Signed)
Approved: #[id number] x 0 

## 2016-01-11 NOTE — Telephone Encounter (Signed)
rx called into pharmacy

## 2016-01-11 NOTE — Telephone Encounter (Signed)
09/08/2015 

## 2016-01-13 ENCOUNTER — Ambulatory Visit (INDEPENDENT_AMBULATORY_CARE_PROVIDER_SITE_OTHER): Payer: Medicare PPO

## 2016-01-13 DIAGNOSIS — J4540 Moderate persistent asthma, uncomplicated: Secondary | ICD-10-CM | POA: Diagnosis not present

## 2016-01-14 MED ORDER — OMALIZUMAB 150 MG ~~LOC~~ SOLR
375.0000 mg | Freq: Once | SUBCUTANEOUS | Status: AC
  Administered 2016-01-13: 375 mg via SUBCUTANEOUS

## 2016-01-15 ENCOUNTER — Other Ambulatory Visit: Payer: Self-pay | Admitting: Internal Medicine

## 2016-01-18 ENCOUNTER — Encounter: Payer: Self-pay | Admitting: Internal Medicine

## 2016-01-18 ENCOUNTER — Telehealth: Payer: Self-pay | Admitting: Pulmonary Disease

## 2016-01-18 ENCOUNTER — Encounter: Payer: Self-pay | Admitting: Gastroenterology

## 2016-01-18 ENCOUNTER — Ambulatory Visit (INDEPENDENT_AMBULATORY_CARE_PROVIDER_SITE_OTHER): Payer: Medicare PPO | Admitting: Internal Medicine

## 2016-01-18 VITALS — BP 110/80 | HR 68 | Temp 98.1°F | Wt 225.5 lb

## 2016-01-18 DIAGNOSIS — R194 Change in bowel habit: Secondary | ICD-10-CM | POA: Diagnosis not present

## 2016-01-18 DIAGNOSIS — F39 Unspecified mood [affective] disorder: Secondary | ICD-10-CM | POA: Insufficient documentation

## 2016-01-18 NOTE — Assessment & Plan Note (Signed)
Somatic concerns and anxiety This is actually causing some dysthymia Will set up with GI to dispell or follow up on his concerns

## 2016-01-18 NOTE — Assessment & Plan Note (Signed)
He is still worried about this--but they are concerned about potential risks of colonoscopy Will set him up with Dr Fuller Plan to see if he is worried about his stool changes---if so, we should proceed with the colonoscopy

## 2016-01-18 NOTE — Progress Notes (Signed)
Pre visit review using our clinic review tool, if applicable. No additional management support is needed unless otherwise documented below in the visit note. 

## 2016-01-18 NOTE — Progress Notes (Signed)
Subjective:    Patient ID: [name], male    DOB: Feb 08, 1934, 82 y.o.   MRN: [id number]  HPI Here with wife for follow up of anxiety and bowel changes  He had concerns about his blood work Reviewed that sodium and kidney function did normalize Hemoglobin was back to normal since the TKR  Still very worried about his bowels Using the senekot--works some days but not others Stools are very thin all the time Did make the referral for colonoscopy--but decided to forego due to wife's concern about safety But he continues to worry about it Notes rectal urgency at times--wears pad  Current Outpatient Prescriptions on File Prior to Visit  Medication Sig Dispense Refill  . ALPRAZolam (XANAX) 0.5 MG tablet TAKE 1 TABLET BY MOUTH THREE TIMES DAILY AS NEEDED FOR ANXIETY 90 tablet 0  . Cholecalciferol (VITAMIN D3) 5000 UNITS CAPS Take 1 capsule by mouth daily.    . Coenzyme Q10 (CO Q 10) 100 MG CAPS Take 1 capsule by mouth daily.    . Cranberry 425 MG CAPS Take 2 capsules by mouth daily.    . ferrous sulfate 325 (65 FE) MG tablet Take 1 tablet (325 mg total) by mouth 2 (two) times daily with a meal. 60 tablet 3  . fluticasone (FLONASE) 50 MCG/ACT nasal spray Place 2 sprays into both nostrils 2 (two) times daily.    Marland Kitchen FOLIC ACID PO Take [id number] mcg by mouth daily.     . furosemide (LASIX) 20 MG tablet Take 20 mg by mouth at bedtime.    . gabapentin (NEURONTIN) 300 MG capsule TAKE 1 CAPSULE BY MOUTH THREE TIMES DAILY 90 capsule 0  . metoprolol succinate (TOPROL-XL) 50 MG 24 hr tablet TAKE 1 TABLET BY MOUTH EVERY DAY 90 tablet 3  . Milk Thistle 250 MG CAPS Take 1 capsule by mouth daily.    . Multiple Vitamin (MULTIVITAMIN) tablet Take 1 tablet by mouth daily.      Marland Kitchen omalizumab (XOLAIR) 150 MG injection Injection every 15 days    . Omega-3 Fatty Acids (FISH OIL) 1200 MG CAPS Take 2 capsules by mouth daily.    Marland Kitchen omeprazole (PRILOSEC) 20 MG capsule TAKE 1 CAPSULE BY MOUTH EVERY DAY 90 capsule 3  .  oxyCODONE (OXY IR/ROXICODONE) 5 MG immediate release tablet Take 1 tablet (5 mg total) by mouth every 4 (four) hours as needed for severe pain or breakthrough pain. 20 tablet 0  . polyethylene glycol (GLYCOLAX) packet Take 34 g by mouth daily.     . pravastatin (PRAVACHOL) 20 MG tablet TAKE 1 TABLET BY MOUTH EVERY DAY 90 tablet 3  . Probiotic Product (ALIGN) 4 MG CAPS Take 1 capsule by mouth daily.     . Selenium 200 MCG CAPS Take 1 capsule by mouth daily.    . sennosides-docusate sodium (SENOKOT-S) 8.6-50 MG tablet Take 1-2 tablets by mouth daily.    . solifenacin (VESICARE) 5 MG tablet Take 5 mg by mouth at bedtime. For bladder spasms    . traMADol (ULTRAM) 50 MG tablet TAKE 1 TABLET BY MOUTH FOUR TIMES DAILY 120 tablet 0  . Turmeric 500 MG CAPS Take 500 mg by mouth daily.    . vitamin B-12 (CYANOCOBALAMIN) 500 MCG tablet Take 500 mcg by mouth daily.     No current facility-administered medications on file prior to visit.    Allergies  Allergen Reactions  . Ciprofloxacin Nausea And Vomiting    Headache  . Citalopram Hydrobromide Other (  See Comments)    "unknown"  . Lorazepam     Adverse reaction  . Paroxetine Nausea Only  . Ramipril Other (See Comments)    "unknown"  . Simvastatin     Past Medical History  Diagnosis Date  . CAD (coronary artery disease)   . Neurogenic bladder   . Asthma   . Chronic sinusitis   . Diverticulitis   . GERD (gastroesophageal reflux disease)   . Arthritis     rheumatoid  . Cancer [facility])     Prostate  . IBS (irritable bowel syndrome)   . Anxiety   . Neurodermatitis   . ED (erectile dysfunction)   . OSA (obstructive sleep apnea)   . Allergy   . Hypertension   . Depression   . Hx of adenomatous colonic polyps   . History of SIADH   . Hyponatremia   . Chronic kidney disease     permnaent foley catheter  . Anemia     Past Surgical History  Procedure Laterality Date  . Prostate surgery      PROSTATECTOMY  . Knee surgery       ARTHROSCOPY LEFT  . Nasal sinus surgery  2009    DEVIATED SEPTUM AND POLYPS  . Urethral stricture dilatation  02-2010    Dr.Cope  . Total knee arthroplasty Left 9/15    Dr [name]  . Joint replacement    . Knee arthroplasty Right 09/02/2015    Procedure: COMPUTER ASSISTED TOTAL KNEE ARTHROPLASTY;  Surgeon: [name], MD;  Location: [facility] ORS;  Service: Orthopedics;  Laterality: Right;    Family History  Problem Relation Age of Onset  . Heart disease Mother 83    heart failure  . Pneumonia Father 82    pnemonia    Social History   Social History  . Marital Status: Married    Spouse Name: N/A  . Number of Children: 2  . Years of Education: N/A   Occupational History  . Radio station owner     retired   Social History Main Topics  . Smoking status: Never Smoker   . Smokeless tobacco: Never Used  . Alcohol Use: Yes     Comment: 4-5 nights/week  . Drug Use: No  . Sexual Activity: Not on file   Other Topics Concern  . Not on file   Social History Narrative   Not sure about a living will or health care POA   Wife should make health care decisions for him   Would accept resuscitation attempts   Not sure about tube feeds   Review of Systems Still having some leg pain since the TKR--but the overall pain is much better (pain is positional when sitting in certain positions--like at church or in a small car) Appetite is okay Weight down 4# since last visit    Objective:   Physical Exam  Psychiatric:  Very anxious about his bowels still          Assessment & Plan:

## 2016-01-19 DIAGNOSIS — M05761 Rheumatoid arthritis with rheumatoid factor of right knee without organ or systems involvement: Secondary | ICD-10-CM | POA: Diagnosis not present

## 2016-01-19 NOTE — Telephone Encounter (Signed)
#   vials:6 Ordered date:01/18/16 Shipping Date:01/19/16 Forgot to put it in.

## 2016-01-19 NOTE — Telephone Encounter (Signed)
#   Vials:6 Arrival Date:01/19/16 Lot [id number] Exp Date:9/20

## 2016-01-27 ENCOUNTER — Encounter: Payer: Self-pay | Admitting: Pulmonary Disease

## 2016-01-27 ENCOUNTER — Ambulatory Visit (INDEPENDENT_AMBULATORY_CARE_PROVIDER_SITE_OTHER): Payer: Medicare PPO | Admitting: Pulmonary Disease

## 2016-01-27 VITALS — BP 112/58 | HR 63 | Ht 71.0 in | Wt 224.0 lb

## 2016-01-27 DIAGNOSIS — J4540 Moderate persistent asthma, uncomplicated: Secondary | ICD-10-CM

## 2016-01-27 NOTE — Patient Instructions (Signed)
Follow up in 1 year.

## 2016-01-27 NOTE — Progress Notes (Signed)
Current Outpatient Prescriptions on File Prior to Visit  Medication Sig  . ALPRAZolam (XANAX) 0.5 MG tablet TAKE 1 TABLET BY MOUTH THREE TIMES DAILY AS NEEDED FOR ANXIETY  . Cholecalciferol (VITAMIN D3) 5000 UNITS CAPS Take 1 capsule by mouth daily.  . Coenzyme Q10 (CO Q 10) 100 MG CAPS Take 1 capsule by mouth daily.  . Cranberry 425 MG CAPS Take 2 capsules by mouth daily.  . ferrous sulfate 325 (65 FE) MG tablet Take 1 tablet (325 mg total) by mouth 2 (two) times daily with a meal.  . fluticasone (FLONASE) 50 MCG/ACT nasal spray Place 2 sprays into both nostrils 2 (two) times daily.  Marland Kitchen FOLIC ACID PO Take [id number] mcg by mouth daily.   . furosemide (LASIX) 20 MG tablet Take 20 mg by mouth at bedtime.  . gabapentin (NEURONTIN) 300 MG capsule TAKE 1 CAPSULE BY MOUTH THREE TIMES DAILY  . metoprolol succinate (TOPROL-XL) 50 MG 24 hr tablet TAKE 1 TABLET BY MOUTH EVERY DAY  . Milk Thistle 250 MG CAPS Take 1 capsule by mouth daily.  . Multiple Vitamin (MULTIVITAMIN) tablet Take 1 tablet by mouth daily.    Marland Kitchen omalizumab (XOLAIR) 150 MG injection Injection every 15 days  . Omega-3 Fatty Acids (FISH OIL) 1200 MG CAPS Take 2 capsules by mouth daily.  Marland Kitchen omeprazole (PRILOSEC) 20 MG capsule TAKE 1 CAPSULE BY MOUTH EVERY DAY  . polyethylene glycol (GLYCOLAX) packet Take 34 g by mouth daily.   . pravastatin (PRAVACHOL) 20 MG tablet TAKE 1 TABLET BY MOUTH EVERY DAY  . Probiotic Product (ALIGN) 4 MG CAPS Take 1 capsule by mouth daily.   . Selenium 200 MCG CAPS Take 1 capsule by mouth daily.  . sennosides-docusate sodium (SENOKOT-S) 8.6-50 MG tablet Take 1-2 tablets by mouth daily.  . solifenacin (VESICARE) 5 MG tablet Take 5 mg by mouth at bedtime. For bladder spasms  . traMADol (ULTRAM) 50 MG tablet TAKE 1 TABLET BY MOUTH FOUR TIMES DAILY  . Turmeric 500 MG CAPS Take 500 mg by mouth daily.  . vitamin B-12 (CYANOCOBALAMIN) 500 MCG tablet Take 500 mcg by mouth daily.   No current facility-administered medications  on file prior to visit.     Chief Complaint  Patient presents with  . Follow-up    No new complaints. Some congestion in AM but lessens as the day goes by.     Tests PFT 04/07/10 >> FEV1 2.61 (86%), FEV1% 73, TLC 7.26 (85%), TLC 112%  Past medical hx Rheumatoid arthritis, CAD, Neurogenic bladder, GERD, Prostate cancer, IBS, Anxiety, ED, HTN, Depression, SIADH, OSA >> resolved with weight loss  Past surgical hx, Allergies, Family hx, Social hx all reviewed.  Vital Signs BP 112/58 mmHg  Pulse 63  Ht 5\' 11"  (1.803 m)  Wt 224 lb (101.606 kg)  BMI 31.26 kg/m2  SpO2 93%  History of Present Illness [name] is a 82 y.o. male with Allergic asthma.  He has been well maintained on xolair.  He has been on this since 2010.  After starting xolair his frequency of prednisone therapy has gone to nothing.  He is not needing any other maintenance asthma therapy.  He uses albuterol once every few months  He had b/l knee replacement >> this has helped his pain symptoms and function.  He remains on MTX for RA through Dr. Luane School.  Physical Exam  General - No distress ENT - No sinus tenderness, no oral exudate, no LAN Cardiac - s1s2 regular, no  murmur Chest - No wheeze/rales/dullness Back - No focal tenderness Abd - Soft, non-tender Ext - No edema Neuro - Normal strength Skin - No rashes Psych - normal mood, and behavior   Assessment/Plan  Allergic asthma. Plan: - continue xolair with prn albuterol   Patient Instructions  Follow up in 1 year     Chesley Mires, MD [place] Pager:  [phone]

## 2016-01-28 ENCOUNTER — Ambulatory Visit (INDEPENDENT_AMBULATORY_CARE_PROVIDER_SITE_OTHER): Payer: Medicare PPO

## 2016-01-28 DIAGNOSIS — J4540 Moderate persistent asthma, uncomplicated: Secondary | ICD-10-CM | POA: Diagnosis not present

## 2016-02-03 ENCOUNTER — Other Ambulatory Visit: Payer: Self-pay | Admitting: Internal Medicine

## 2016-02-03 MED ORDER — OMALIZUMAB 150 MG ~~LOC~~ SOLR
375.0000 mg | Freq: Once | SUBCUTANEOUS | Status: AC
  Administered 2016-01-28: 375 mg via SUBCUTANEOUS

## 2016-02-03 NOTE — Telephone Encounter (Signed)
Called in rx on voice mail at the pharmacy

## 2016-02-03 NOTE — Telephone Encounter (Signed)
Approved: #[id number] x 0 

## 2016-02-03 NOTE — Telephone Encounter (Signed)
Last refill 01-04-16 #90/0. Last OV 01-18-16. No future OV scheduled

## 2016-02-10 DIAGNOSIS — N319 Neuromuscular dysfunction of bladder, unspecified: Secondary | ICD-10-CM | POA: Diagnosis not present

## 2016-02-12 ENCOUNTER — Ambulatory Visit (INDEPENDENT_AMBULATORY_CARE_PROVIDER_SITE_OTHER): Payer: Medicare PPO

## 2016-02-12 DIAGNOSIS — J4540 Moderate persistent asthma, uncomplicated: Secondary | ICD-10-CM

## 2016-02-15 ENCOUNTER — Ambulatory Visit: Payer: Medicare PPO | Admitting: Internal Medicine

## 2016-02-15 MED ORDER — OMALIZUMAB 150 MG ~~LOC~~ SOLR
375.0000 mg | Freq: Once | SUBCUTANEOUS | Status: AC
  Administered 2016-02-15: 375 mg via SUBCUTANEOUS

## 2016-02-18 ENCOUNTER — Ambulatory Visit: Payer: Medicare PPO | Admitting: Internal Medicine

## 2016-02-18 ENCOUNTER — Telehealth: Payer: Self-pay | Admitting: Pulmonary Disease

## 2016-02-18 NOTE — Telephone Encounter (Signed)
#   vials:6 Ordered date:02/18/16 Shipping Date:3/24 or 02/22/16

## 2016-02-19 NOTE — Telephone Encounter (Signed)
#   Vials:6 Arrival Date:02/19/2016  Lot [id number] Exp Date:09/2019

## 2016-02-25 ENCOUNTER — Other Ambulatory Visit: Payer: Self-pay | Admitting: Internal Medicine

## 2016-02-25 ENCOUNTER — Telehealth: Payer: Self-pay | Admitting: Pulmonary Disease

## 2016-02-25 NOTE — Telephone Encounter (Signed)
Pt states that his Xolair runs out in April (22nd) and he is needing this renewed. Pt requests that I send this to [name] to handle. Patient aware that Joellen Jersey is not in the office today and may not return until Monday 02/29/16 but that she will see this message as soon as she returns.  Please advise on renewing patient's Xolair. Thanks.

## 2016-02-29 ENCOUNTER — Ambulatory Visit (INDEPENDENT_AMBULATORY_CARE_PROVIDER_SITE_OTHER): Payer: Medicare PPO

## 2016-02-29 DIAGNOSIS — J4540 Moderate persistent asthma, uncomplicated: Secondary | ICD-10-CM | POA: Diagnosis not present

## 2016-02-29 MED ORDER — OMALIZUMAB 150 MG ~~LOC~~ SOLR
375.0000 mg | Freq: Once | SUBCUTANEOUS | Status: AC
  Administered 2016-02-29: 375 mg via SUBCUTANEOUS

## 2016-02-29 NOTE — Telephone Encounter (Signed)
[phone], pt cb

## 2016-02-29 NOTE — Telephone Encounter (Signed)
Left detailed message for Mr. Iwen advising him that Joellen Jersey is out of office today and she will contact him when she returns regarding Xolair injections. To [name] for follow up

## 2016-02-29 NOTE — Telephone Encounter (Signed)
Pt came in for there xolair shot today and dropped off papers i put them in the xolair spot at [employer]

## 2016-03-03 NOTE — Telephone Encounter (Signed)
Pt calling requesting to speak to [facility] [phone]

## 2016-03-03 NOTE — Telephone Encounter (Signed)
I have letter that patient dropped off and I am working with his [employer] for [employer]. Thanks.

## 2016-03-03 NOTE — Telephone Encounter (Signed)
[name] please advise. Thanks. 

## 2016-03-04 ENCOUNTER — Other Ambulatory Visit: Payer: Self-pay | Admitting: Internal Medicine

## 2016-03-04 DIAGNOSIS — N319 Neuromuscular dysfunction of bladder, unspecified: Secondary | ICD-10-CM | POA: Diagnosis not present

## 2016-03-04 NOTE — Telephone Encounter (Signed)
Called in rx on voice mail at pharmacy 

## 2016-03-04 NOTE — Telephone Encounter (Signed)
Approved: #[id number] x 0 

## 2016-03-04 NOTE — Telephone Encounter (Signed)
Last refill 02-03-16 #90. Last OV 01-18-16 No future appointment scheduled

## 2016-03-04 NOTE — Telephone Encounter (Signed)
Spoke with pt, advised him of [name]'s update below.  Pt expressed understanding, wishes to be updated as this process progresses. Forwarding back to [facility] for follow-up.

## 2016-03-08 DIAGNOSIS — N319 Neuromuscular dysfunction of bladder, unspecified: Secondary | ICD-10-CM | POA: Diagnosis not present

## 2016-03-08 DIAGNOSIS — R339 Retention of urine, unspecified: Secondary | ICD-10-CM | POA: Diagnosis not present

## 2016-03-08 NOTE — Telephone Encounter (Signed)
Will send to [facility] to follow up per her request.

## 2016-03-08 NOTE — Telephone Encounter (Signed)
[name] from [employer], [phone].  Need PA for patient's ins and also whether we would like it to be referred over to access to care foundation.

## 2016-03-09 NOTE — Telephone Encounter (Signed)
[name] will close encounter once completed.

## 2016-03-14 NOTE — Telephone Encounter (Signed)
[facility], spoke with Nicholos Johns Rx form is missing a signature from Dr Halford Chessman Form will need to be signed and faxed back ASAP. Will send to Joellen Jersey to address 03/15/16 when she returns to the office.

## 2016-03-14 NOTE — Telephone Encounter (Signed)
Calling back about pt xolair rx and can be reached @ [phone].Hillery Hunter

## 2016-03-15 ENCOUNTER — Encounter: Payer: Self-pay | Admitting: Gastroenterology

## 2016-03-15 ENCOUNTER — Ambulatory Visit (INDEPENDENT_AMBULATORY_CARE_PROVIDER_SITE_OTHER): Payer: Medicare PPO | Admitting: Gastroenterology

## 2016-03-15 VITALS — BP 116/60 | HR 64 | Ht 68.75 in | Wt 223.0 lb

## 2016-03-15 DIAGNOSIS — K5901 Slow transit constipation: Secondary | ICD-10-CM | POA: Diagnosis not present

## 2016-03-15 DIAGNOSIS — R198 Other specified symptoms and signs involving the digestive system and abdomen: Secondary | ICD-10-CM

## 2016-03-15 DIAGNOSIS — R194 Change in bowel habit: Secondary | ICD-10-CM | POA: Diagnosis not present

## 2016-03-15 MED ORDER — NA SULFATE-K SULFATE-MG SULF 17.5-3.13-1.6 GM/177ML PO SOLN: 1.0000 | Freq: Once | ORAL | Status: DC

## 2016-03-15 NOTE — Progress Notes (Signed)
    History of Present Illness: This is a 82 year old male referred by [name], MD for the evaluation of change in bowel habits. Previously underwent colonoscopy in June 2011 for a personal history of adenomatous colon polyps showing only moderate left colon diverticulosis. He relates long-term difficulties with constipation managed with various laxatives. He notes his bowel habits have been more difficult to manage over the past 3-5 years. He states he has had mild weight loss over the past several months. Most recently he has been taking MiraLAX daily, Senokot frequently in Ducolax tablets occasionally. He notes his stools are sometimes thin and small. He does not feel like he gets adequate evacuation. When he takes Ducolax he often has a very large volume of stool.   Review of Systems: Pertinent positive and negative review of systems were noted in the above HPI section. All other review of systems were otherwise negative.  Current Medications, Allergies, Past Medical History, Past Surgical History, Family History and Social History were reviewed in [employer] record.  Physical Exam: General: Well developed, well nourished, no acute distress Head: Normocephalic and atraumatic Eyes:  sclerae anicteric, EOMI Ears: Normal auditory acuity Mouth: No deformity or lesions Neck: Supple, no masses or thyromegaly Lungs: Clear throughout to auscultation Heart: Regular rate and rhythm; no murmurs, rubs or bruits Abdomen: Soft, non tender and non distended. No masses, hepatosplenomegaly or hernias noted. Normal Bowel sounds Rectal: deferred to colonoscopy Musculoskeletal: Symmetrical with no gross deformities  Skin: No lesions on visible extremities Pulses:  Normal pulses noted Extremities: No clubbing, cyanosis, edema or deformities noted Neurological: Alert oriented x 4, grossly nonfocal Cervical Nodes:  No significant cervical adenopathy Inguinal Nodes: No  significant inguinal adenopathy Psychological:  Alert and cooperative. Anxious  Assessment and Recommendations:  1. Chronic constipation with a gradual change in bowel habits and recent weight loss. He likely has inadequately treated constipation. Rule out colorectal neoplasms. Patient to titrated dose of MiraLAX between 2 and 3 capfuls per day. High fiber diet with adequate daily water intake Discontinue Senokot and attempt to avoid, or at least minimize, the use of Dulcolax. The risks (including bleeding, perforation, infection, missed lesions, medication reactions and possible hospitalization or surgery if complications occur), benefits, and alternatives to colonoscopy with possible biopsy and possible polypectomy were discussed with the patient and they consent to proceed.   I spent 25 minutes of face-to-face time with the patient. Greater than 50% of the time was spent counseling and coordinating care.  cc: [name], MD [place], [place] [id number]

## 2016-03-15 NOTE — Patient Instructions (Signed)
You can take Miralax twice daily but titrate to 1-3 x daily depending on bowel movements.  You have been scheduled for a colonoscopy. Please follow written instructions given to you at your visit today.  Please pick up your prep supplies at the pharmacy within the next 1-3 days. If you use inhalers (even only as needed), please bring them with you on the day of your procedure. Your physician has requested that you go to www.startemmi.com and enter the access code given to you at your visit today. This web site gives a general overview about your procedure. However, you should still follow specific instructions given to you by our office regarding your preparation for the procedure.  Normal BMI (Body Mass Index- based on height and weight) is between 23 and 30. Your BMI today is Body mass index is 33.18 kg/(m^2). Marland Kitchen Please consider follow up  regarding your BMI with your Primary Care Provider.  Thank you for choosing me and [facility].  Pricilla Riffle. Dagoberto Ligas., MD., Marval Regal

## 2016-03-15 NOTE — Telephone Encounter (Signed)
[name] please advise when this has been completed.  Thanks.

## 2016-03-16 ENCOUNTER — Ambulatory Visit (INDEPENDENT_AMBULATORY_CARE_PROVIDER_SITE_OTHER): Payer: Medicare PPO

## 2016-03-16 DIAGNOSIS — J4540 Moderate persistent asthma, uncomplicated: Secondary | ICD-10-CM

## 2016-03-16 NOTE — Telephone Encounter (Signed)
Spoke with Alroy Bailiff and [name] regarding this- pt has been updated in the status of his PA for Xolair.   Forwarding back to [name] to follow up on.

## 2016-03-16 NOTE — Telephone Encounter (Signed)
PATIENT IN [facility] requesting to speak to Joellen Jersey about this

## 2016-03-17 MED ORDER — OMALIZUMAB 150 MG ~~LOC~~ SOLR
375.0000 mg | Freq: Once | SUBCUTANEOUS | Status: AC
  Administered 2016-03-17: 375 mg via SUBCUTANEOUS

## 2016-03-21 ENCOUNTER — Other Ambulatory Visit: Payer: Self-pay | Admitting: Internal Medicine

## 2016-03-21 NOTE — Telephone Encounter (Signed)
Routed to [facility] for follow up.

## 2016-03-21 NOTE — Telephone Encounter (Signed)
[name] [phone] reference [phone], wanting to know if patient is going to use specialty pharm or do buy and bill

## 2016-03-22 ENCOUNTER — Telehealth: Payer: Self-pay | Admitting: *Deleted

## 2016-03-22 ENCOUNTER — Ambulatory Visit (INDEPENDENT_AMBULATORY_CARE_PROVIDER_SITE_OTHER): Payer: Medicare PPO | Admitting: Internal Medicine

## 2016-03-22 ENCOUNTER — Encounter: Payer: Self-pay | Admitting: Internal Medicine

## 2016-03-22 VITALS — BP 108/64 | HR 70 | Temp 98.2°F | Wt 222.0 lb

## 2016-03-22 DIAGNOSIS — R194 Change in bowel habit: Secondary | ICD-10-CM | POA: Diagnosis not present

## 2016-03-22 NOTE — Progress Notes (Signed)
Subjective:    Patient ID: [name], male    DOB: March 03, 1934, 81 y.o.   MRN: [id number]  HPI Here with wife due to ongoing constipation Did see Dr Fuller Plan --reviewed his note Did have the colonoscopy scheduled---but then canceled it He never asked-- "do you think I need a colonoscopy to rule out colon cancer"  He recommended more miralax and less stimulants Taking it bid usually This has helped some  Current Outpatient Prescriptions on File Prior to Visit  Medication Sig Dispense Refill  . ALPRAZolam (XANAX) 0.5 MG tablet TAKE 1 TABLET BY MOUTH THREE TIMES DAILY AS NEEDED FOR ANXIETY 90 tablet 0  . aspirin 81 MG tablet Take 81 mg by mouth daily.    . Cholecalciferol (VITAMIN D3) 5000 UNITS CAPS Take 1 capsule by mouth daily.    . Coenzyme Q10 (CO Q 10) 100 MG CAPS Take 1 capsule by mouth daily.    . Cranberry 425 MG CAPS Take 2 capsules by mouth daily.    . fluticasone (FLONASE) 50 MCG/ACT nasal spray Place 2 sprays into both nostrils 2 (two) times daily.    Marland Kitchen FOLIC ACID PO Take [id number] mcg by mouth daily.     . furosemide (LASIX) 20 MG tablet Take 20 mg by mouth at bedtime.    . gabapentin (NEURONTIN) 300 MG capsule TAKE 1 CAPSULE BY MOUTH THREE TIMES DAILY 90 capsule 2  . hydroxychloroquine (PLAQUENIL) 200 MG tablet Take 1 tablet by mouth 2 (two) times daily.    . methotrexate (RHEUMATREX) 2.5 MG tablet Take 10 tablets by mouth once a week.    . metoprolol succinate (TOPROL-XL) 50 MG 24 hr tablet TAKE 1 TABLET BY MOUTH EVERY DAY 90 tablet 3  . Milk Thistle 250 MG CAPS Take 1 capsule by mouth daily.    . Multiple Vitamin (MULTIVITAMIN) tablet Take 1 tablet by mouth daily.      . Omega-3 Fatty Acids (FISH OIL) 1200 MG CAPS Take 2 capsules by mouth daily.    Marland Kitchen omeprazole (PRILOSEC) 20 MG capsule TAKE 1 CAPSULE BY MOUTH EVERY DAY (Patient taking differently: TAKE 1 CAPSULE BY MOUTH AS NEEDED) 90 capsule 3  . polyethylene glycol (GLYCOLAX) packet Take 34 g by mouth daily.     .  pravastatin (PRAVACHOL) 20 MG tablet TAKE 1 TABLET BY MOUTH EVERY DAY 90 tablet 3  . Probiotic Product (ALIGN) 4 MG CAPS Take 1 capsule by mouth daily.     . Selenium 200 MCG CAPS Take 1 capsule by mouth daily.    . solifenacin (VESICARE) 5 MG tablet Take 5 mg by mouth at bedtime. For bladder spasms    . traMADol (ULTRAM) 50 MG tablet TAKE 1 TABLET BY MOUTH FOUR TIMES DAILY 120 tablet 0  . Turmeric 500 MG CAPS Take 500 mg by mouth daily.    . vitamin B-12 (CYANOCOBALAMIN) 500 MCG tablet Take 500 mcg by mouth daily.     No current facility-administered medications on file prior to visit.    Allergies  Allergen Reactions  . Ciprofloxacin Nausea And Vomiting    Headache  . Citalopram Hydrobromide Other (See Comments)    "unknown"  . Lorazepam     Adverse reaction  . Paroxetine Nausea Only  . Ramipril Other (See Comments)    "unknown"  . Simvastatin     Past Medical History  Diagnosis Date  . CAD (coronary artery disease)   . Neurogenic bladder   . Asthma   . Chronic  sinusitis   . Diverticulitis   . GERD (gastroesophageal reflux disease)   . Arthritis     rheumatoid  . Cancer [facility])     Prostate  . IBS (irritable bowel syndrome)   . Anxiety   . Neurodermatitis   . ED (erectile dysfunction)   . OSA (obstructive sleep apnea)   . Allergy   . Hypertension   . Depression   . Hx of adenomatous colonic polyps   . History of SIADH   . Hyponatremia   . Chronic kidney disease     permnaent foley catheter  . Anemia     Past Surgical History  Procedure Laterality Date  . Prostate surgery      PROSTATECTOMY  . Knee surgery Left     ARTHROSCOPY LEFT  . Nasal sinus surgery  2009    DEVIATED SEPTUM AND POLYPS  . Urethral stricture dilatation  02-2010    Dr.Cope  . Total knee arthroplasty Left 9/15    Dr [name]  . Knee arthroplasty Right 09/02/2015    Procedure: COMPUTER ASSISTED TOTAL KNEE ARTHROPLASTY;  Surgeon: [name], MD;  Location: [facility] ORS;  Service:  Orthopedics;  Laterality: Right;    Family History  Problem Relation Age of Onset  . Heart disease Mother 1    heart failure  . Pneumonia Father 81    pnemonia    Social History   Social History  . Marital Status: Married    Spouse Name: N/A  . Number of Children: 2  . Years of Education: N/A   Occupational History  . Radio station owner     retired   Social History Main Topics  . Smoking status: Never Smoker   . Smokeless tobacco: Never Used  . Alcohol Use: Yes     Comment: 4-5 nights/week  . Drug Use: No  . Sexual Activity: Not on file   Other Topics Concern  . Not on file   Social History Narrative   Not sure about a living will or health care POA   Wife should make health care decisions for him   Would accept resuscitation attempts   Not sure about tube feeds   Review of Systems Continues to have concern anytime he loses weight--but he is the same as 6 years ago, etc Appetite is okay No blood in stool    Objective:   Physical Exam  Psychiatric:  Still anxious about this          Assessment & Plan:

## 2016-03-22 NOTE — Progress Notes (Signed)
Pre visit review using our clinic review tool, if applicable. No additional management support is needed unless otherwise documented below in the visit note. 

## 2016-03-22 NOTE — Telephone Encounter (Signed)
Case is still active and [name] is working with me to get PA and specialty pharmacy taken care of.

## 2016-03-22 NOTE — Assessment & Plan Note (Signed)
Chronic constipation may be some better  Discussed that this doesn't really indicate a higher risk of colon cancer Counseled all of 15 minute visit Will go ahead and check FIT instead and only proceed with colonoscopy if positive

## 2016-03-23 DIAGNOSIS — E871 Hypo-osmolality and hyponatremia: Secondary | ICD-10-CM | POA: Diagnosis not present

## 2016-03-23 DIAGNOSIS — R600 Localized edema: Secondary | ICD-10-CM | POA: Diagnosis not present

## 2016-03-23 NOTE — Telephone Encounter (Addendum)
#   Vials:6 Arrival Date:03/23/16 Lot [id number] Exp Date:11/20  I didn't realize the xolair order sheet was in here until it was too late. Joellen Jersey is aware.

## 2016-03-23 NOTE — Telephone Encounter (Signed)
#   vials:6 Ordered date:03/22/16 Shipping Date:03/23/16

## 2016-03-24 ENCOUNTER — Other Ambulatory Visit: Payer: Self-pay | Admitting: Internal Medicine

## 2016-03-24 NOTE — Telephone Encounter (Signed)
Omeprazole Rx sent electronically. Tramadol Left refill on voice mail at pharmacy

## 2016-03-24 NOTE — Telephone Encounter (Signed)
Pt left v/m checking on status of tramadol and omeprazole refills. Medication tramadol phoned to pharmacist at [facility] as instructed. Omeprazole sent electronically. Pt notified refills done and pt voiced understanding.

## 2016-03-24 NOTE — Telephone Encounter (Signed)
Approved: tramadol #120 x 0 Other for a year per routine

## 2016-03-24 NOTE — Telephone Encounter (Signed)
Tramadol last filled 01-11-16 #120. Last OV Acute on 03-22-16 Next OV 09-26-16

## 2016-03-28 DIAGNOSIS — E871 Hypo-osmolality and hyponatremia: Secondary | ICD-10-CM | POA: Diagnosis not present

## 2016-03-30 ENCOUNTER — Telehealth: Payer: Self-pay | Admitting: Pulmonary Disease

## 2016-03-30 NOTE — Telephone Encounter (Signed)
[name], please advise on this.

## 2016-03-30 NOTE — Telephone Encounter (Signed)
Patient cb x2, [phone]

## 2016-03-30 NOTE — Telephone Encounter (Signed)
Pt is aware that I have rec'd same denial for Part D but approved for Part B benefits for his Xolair. Pt is aware that I will confirm this with his insurance company and then set up his Xolair appt-pt would like to get on Monday 04-04-16.

## 2016-04-01 DIAGNOSIS — N319 Neuromuscular dysfunction of bladder, unspecified: Secondary | ICD-10-CM | POA: Diagnosis not present

## 2016-04-01 NOTE — Telephone Encounter (Signed)
Pt's Xolair has been approved for buy and bill for 2 years. Pt is aware and nothing more needed at this time.

## 2016-04-01 NOTE — Telephone Encounter (Signed)
Patient is calling [phone].  States he has heard from [facility] and needs to speak to [place] as soon as possible.

## 2016-04-01 NOTE — Telephone Encounter (Signed)
Pt will remain buy and bill due to billing issues with Part D benefits. This was discussed with Dennison Bulla. Pt and [employer] are aware. Appt made for Monday 04-04-16 at 3:30pm for next Xolair injection. Nothing more needed at this time.

## 2016-04-01 NOTE — Telephone Encounter (Signed)
Pt calling back again about call from [facility] wants to talk to [place]

## 2016-04-01 NOTE — Telephone Encounter (Signed)
Routed to [name] for follow up.  

## 2016-04-01 NOTE — Telephone Encounter (Signed)
Calling to speak to [facility] about call he got from [facility] this morning [phone]

## 2016-04-01 NOTE — Telephone Encounter (Signed)
Routing to [name] 

## 2016-04-04 ENCOUNTER — Ambulatory Visit (INDEPENDENT_AMBULATORY_CARE_PROVIDER_SITE_OTHER): Payer: Medicare PPO

## 2016-04-04 ENCOUNTER — Other Ambulatory Visit: Payer: Self-pay | Admitting: Internal Medicine

## 2016-04-04 DIAGNOSIS — J4540 Moderate persistent asthma, uncomplicated: Secondary | ICD-10-CM | POA: Diagnosis not present

## 2016-04-04 NOTE — Telephone Encounter (Signed)
Last filled 03-04-16 #90 Last OV 03-22-16 Next OV 09-26-16

## 2016-04-05 MED ORDER — OMALIZUMAB 150 MG ~~LOC~~ SOLR
375.0000 mg | Freq: Once | SUBCUTANEOUS | Status: AC
  Administered 2016-04-04: 375 mg via SUBCUTANEOUS

## 2016-04-05 NOTE — Telephone Encounter (Signed)
Left refill on voice mail at pharmacy  

## 2016-04-05 NOTE — Telephone Encounter (Signed)
Approved: #[id number] x 0 

## 2016-04-06 DIAGNOSIS — M0579 Rheumatoid arthritis with rheumatoid factor of multiple sites without organ or systems involvement: Secondary | ICD-10-CM | POA: Diagnosis not present

## 2016-04-12 ENCOUNTER — Encounter: Payer: Medicare PPO | Admitting: Gastroenterology

## 2016-04-19 ENCOUNTER — Ambulatory Visit: Payer: Medicare PPO

## 2016-04-22 ENCOUNTER — Ambulatory Visit (INDEPENDENT_AMBULATORY_CARE_PROVIDER_SITE_OTHER): Payer: Medicare PPO

## 2016-04-22 DIAGNOSIS — J4540 Moderate persistent asthma, uncomplicated: Secondary | ICD-10-CM

## 2016-04-29 DIAGNOSIS — N319 Neuromuscular dysfunction of bladder, unspecified: Secondary | ICD-10-CM | POA: Diagnosis not present

## 2016-04-29 MED ORDER — OMALIZUMAB 150 MG ~~LOC~~ SOLR
375.0000 mg | Freq: Once | SUBCUTANEOUS | Status: AC
  Administered 2016-04-22: 375 mg via SUBCUTANEOUS

## 2016-05-02 ENCOUNTER — Telehealth: Payer: Self-pay | Admitting: Pulmonary Disease

## 2016-05-02 NOTE — Telephone Encounter (Signed)
#   vials:6 Ordered date:05/02/16 Shipping Date:05/03/16

## 2016-05-03 ENCOUNTER — Other Ambulatory Visit: Payer: Self-pay | Admitting: Internal Medicine

## 2016-05-03 NOTE — Telephone Encounter (Signed)
Last filled 04-05-16 #90 Last OV acute 03-22-16 Next OV 09-26-16

## 2016-05-03 NOTE — Telephone Encounter (Signed)
#   Vials:6 Arrival Date:05/03/16 Lot [id number] Exp Date:12/20

## 2016-05-04 NOTE — Telephone Encounter (Signed)
Approved: #[id number] x 0 

## 2016-05-04 NOTE — Telephone Encounter (Signed)
Left refill on voice mail at pharmacy  

## 2016-05-05 DIAGNOSIS — M722 Plantar fascial fibromatosis: Secondary | ICD-10-CM | POA: Diagnosis not present

## 2016-05-05 DIAGNOSIS — M79674 Pain in right toe(s): Secondary | ICD-10-CM | POA: Diagnosis not present

## 2016-05-05 DIAGNOSIS — B351 Tinea unguium: Secondary | ICD-10-CM | POA: Diagnosis not present

## 2016-05-05 DIAGNOSIS — M2041 Other hammer toe(s) (acquired), right foot: Secondary | ICD-10-CM | POA: Diagnosis not present

## 2016-05-05 DIAGNOSIS — M7662 Achilles tendinitis, left leg: Secondary | ICD-10-CM | POA: Diagnosis not present

## 2016-05-05 DIAGNOSIS — M79675 Pain in left toe(s): Secondary | ICD-10-CM | POA: Diagnosis not present

## 2016-05-09 ENCOUNTER — Ambulatory Visit (INDEPENDENT_AMBULATORY_CARE_PROVIDER_SITE_OTHER): Payer: Medicare PPO

## 2016-05-09 DIAGNOSIS — J4540 Moderate persistent asthma, uncomplicated: Secondary | ICD-10-CM | POA: Diagnosis not present

## 2016-05-10 MED ORDER — OMALIZUMAB 150 MG ~~LOC~~ SOLR
375.0000 mg | Freq: Once | SUBCUTANEOUS | Status: AC
  Administered 2016-05-09: 375 mg via SUBCUTANEOUS

## 2016-05-13 ENCOUNTER — Other Ambulatory Visit: Payer: Self-pay | Admitting: Internal Medicine

## 2016-05-18 ENCOUNTER — Encounter: Payer: Self-pay | Admitting: Internal Medicine

## 2016-05-18 ENCOUNTER — Ambulatory Visit (INDEPENDENT_AMBULATORY_CARE_PROVIDER_SITE_OTHER): Payer: Medicare PPO | Admitting: Internal Medicine

## 2016-05-18 VITALS — BP 122/70 | HR 57 | Temp 97.3°F | Wt 225.0 lb

## 2016-05-18 DIAGNOSIS — Z8546 Personal history of malignant neoplasm of prostate: Secondary | ICD-10-CM | POA: Diagnosis not present

## 2016-05-18 DIAGNOSIS — R232 Flushing: Secondary | ICD-10-CM | POA: Diagnosis not present

## 2016-05-18 DIAGNOSIS — R194 Change in bowel habit: Secondary | ICD-10-CM

## 2016-05-18 LAB — COMPREHENSIVE METABOLIC PANEL
ALK PHOS: 62 U/L (ref 39–117)
ALT: 15 U/L (ref 0–53)
AST: 18 U/L (ref 0–37)
Albumin: 3.8 g/dL (ref 3.5–5.2)
BUN: 15 mg/dL (ref 6–23)
CALCIUM: 9.2 mg/dL (ref 8.4–10.5)
CO2: 32 meq/L (ref 19–32)
Chloride: 99 mEq/L (ref 96–112)
Creatinine, Ser: 0.61 mg/dL (ref 0.40–1.50)
GFR: 135.18 mL/min (ref >60.00)
GLUCOSE: 86 mg/dL (ref 70–99)
POTASSIUM: 4.1 meq/L (ref 3.5–5.1)
Sodium: 135 mEq/L (ref 135–145)
Total Bilirubin: 0.4 mg/dL (ref 0.2–1.2)
Total Protein: 6.1 g/dL (ref 6.0–8.3)

## 2016-05-18 LAB — CBC WITH DIFFERENTIAL/PLATELET
Basophils Absolute: 0 10*3/uL (ref 0.0–0.1)
Basophils Relative: 0.4 % (ref 0.0–3.0)
EOS PCT: 3.7 % (ref 0.0–5.0)
Eosinophils Absolute: 0.2 10*3/uL (ref 0.0–0.7)
HCT: 40.4 % (ref 39.0–52.0)
HEMOGLOBIN: 13.6 g/dL (ref 13.0–17.0)
Lymphocytes Relative: 18.7 % (ref 12.0–46.0)
Lymphs Abs: 1.2 10*3/uL (ref 0.7–4.0)
MCHC: 33.6 g/dL (ref 30.0–36.0)
MCV: 95.6 fl (ref 78.0–100.0)
MONOS PCT: 7.9 % (ref 3.0–12.0)
Monocytes Absolute: 0.5 10*3/uL (ref 0.1–1.0)
Neutro Abs: 4.5 10*3/uL (ref 1.4–7.7)
Neutrophils Relative %: 69.3 % (ref 43.0–77.0)
Platelets: 166 10*3/uL (ref 150.0–400.0)
RBC: 4.22 Mil/uL (ref 4.22–5.81)
RDW: 15 % (ref 11.5–15.5)
WBC: 6.4 10*3/uL (ref 4.0–10.5)

## 2016-05-18 LAB — T4, FREE: FREE T4: 1 ng/dL (ref 0.60–1.60)

## 2016-05-18 LAB — PSA: PSA: 0.01 ng/mL — ABNORMAL LOW (ref 0.10–4.00)

## 2016-05-18 NOTE — Assessment & Plan Note (Signed)
Sound hormonal but not likely related to any meds Chronic constipation so shouldn't be carcinoid (has loose stools with overuse of miralax due to straining) No lupron---but ?related to pituitary changes Will check labs Check PSA--over a year now

## 2016-05-18 NOTE — Assessment & Plan Note (Signed)
Will recheck PSA

## 2016-05-18 NOTE — Progress Notes (Signed)
Pre visit review using our clinic review tool, if applicable. No additional management support is needed unless otherwise documented below in the visit note. 

## 2016-05-18 NOTE — Assessment & Plan Note (Signed)
Chronic constipation and anxiety over this Again discussed colonoscopy and I think he should go ahead

## 2016-05-18 NOTE — Progress Notes (Signed)
Subjective:    Patient ID: [name], male    DOB: Nov 16, 1934, 82 y.o.   MRN: [id number]  HPI Here with daughter---hot flashes  Has noticed over the past 6 weeks-- will get "super hot"--shoulders/neck/back/face Face flushed at times--not always Will last 15-20 minutes Very random Sometimes after a drink--but not consistently 0-2 times a week This is kicking up his anxiety Uses cool washcloth   Still having bowel problems Hasn't used the fecal test yet--- never has "substance" to bowel Always soft--not liquidy "It's just not normal" Takes miralax bid and occasionally prune juice Without this, he is always constipated  Current Outpatient Prescriptions on File Prior to Visit  Medication Sig Dispense Refill  . ALPRAZolam (XANAX) 0.5 MG tablet TAKE 1 TABLET BY MOUTH THREE TIMES DAILY AS NEEDED FOR ANXIETY 90 tablet 0  . aspirin 81 MG tablet Take 81 mg by mouth daily.    . Cholecalciferol (VITAMIN D3) 5000 UNITS CAPS Take 1 capsule by mouth daily.    . Coenzyme Q10 (CO Q 10) 100 MG CAPS Take 1 capsule by mouth daily.    . Cranberry 425 MG CAPS Take 2 capsules by mouth daily.    . fluticasone (FLONASE) 50 MCG/ACT nasal spray Place 2 sprays into both nostrils 2 (two) times daily.    Marland Kitchen FOLIC ACID PO Take [id number] mcg by mouth daily.     . furosemide (LASIX) 20 MG tablet Take 20 mg by mouth at bedtime.    . gabapentin (NEURONTIN) 300 MG capsule TAKE 1 CAPSULE BY MOUTH THREE TIMES DAILY 90 capsule 2  . hydroxychloroquine (PLAQUENIL) 200 MG tablet Take 1 tablet by mouth 2 (two) times daily.    . methotrexate (RHEUMATREX) 2.5 MG tablet Take 10 tablets by mouth once a week.    . metoprolol succinate (TOPROL-XL) 50 MG 24 hr tablet TAKE 1 TABLET BY MOUTH EVERY DAY 90 tablet 3  . Milk Thistle 250 MG CAPS Take 1 capsule by mouth daily.    . Multiple Vitamin (MULTIVITAMIN) tablet Take 1 tablet by mouth daily.      . Omega-3 Fatty Acids (FISH OIL) 1200 MG CAPS Take 2 capsules by mouth daily.      Marland Kitchen omeprazole (PRILOSEC) 20 MG capsule TAKE 1 CAPSULE BY MOUTH EVERY DAY 90 capsule 3  . polyethylene glycol (GLYCOLAX) packet Take 34 g by mouth daily.     . pravastatin (PRAVACHOL) 20 MG tablet TAKE 1 TABLET BY MOUTH EVERY DAY 90 tablet 1  . Probiotic Product (ALIGN) 4 MG CAPS Take 1 capsule by mouth daily.     . Selenium 200 MCG CAPS Take 1 capsule by mouth daily.    . solifenacin (VESICARE) 5 MG tablet Take 5 mg by mouth at bedtime. For bladder spasms    . traMADol (ULTRAM) 50 MG tablet TAKE 1 TABLET BY MOUTH FOUR TIMES DAILY 120 tablet 0  . Turmeric 500 MG CAPS Take 500 mg by mouth daily.    . vitamin B-12 (CYANOCOBALAMIN) 500 MCG tablet Take 500 mcg by mouth daily.     No current facility-administered medications on file prior to visit.    Allergies  Allergen Reactions  . Ciprofloxacin Nausea And Vomiting    Headache  . Citalopram Hydrobromide Other (See Comments)    "unknown"  . Lorazepam     Adverse reaction  . Paroxetine Nausea Only  . Ramipril Other (See Comments)    "unknown"  . Simvastatin     Past Medical History  Diagnosis Date  . CAD (coronary artery disease)   . Neurogenic bladder   . Asthma   . Chronic sinusitis   . Diverticulitis   . GERD (gastroesophageal reflux disease)   . Arthritis     rheumatoid  . Cancer [facility])     Prostate  . IBS (irritable bowel syndrome)   . Anxiety   . Neurodermatitis   . ED (erectile dysfunction)   . OSA (obstructive sleep apnea)   . Allergy   . Hypertension   . Depression   . Hx of adenomatous colonic polyps   . History of SIADH   . Hyponatremia   . Chronic kidney disease     permnaent foley catheter  . Anemia     Past Surgical History  Procedure Laterality Date  . Prostate surgery      PROSTATECTOMY  . Knee surgery Left     ARTHROSCOPY LEFT  . Nasal sinus surgery  2009    DEVIATED SEPTUM AND POLYPS  . Urethral stricture dilatation  02-2010    Dr.Cope  . Total knee arthroplasty Left 9/15    Dr [name]  .  Knee arthroplasty Right 09/02/2015    Procedure: COMPUTER ASSISTED TOTAL KNEE ARTHROPLASTY;  Surgeon: [name], MD;  Location: [facility] ORS;  Service: Orthopedics;  Laterality: Right;    Family History  Problem Relation Age of Onset  . Heart disease Mother 57    heart failure  . Pneumonia Father 8    pnemonia    Social History   Social History  . Marital Status: Married    Spouse Name: N/A  . Number of Children: 2  . Years of Education: N/A   Occupational History  . Radio station owner     retired   Social History Main Topics  . Smoking status: Never Smoker   . Smokeless tobacco: Never Used  . Alcohol Use: Yes     Comment: 4-5 nights/week  . Drug Use: No  . Sexual Activity: Not on file   Other Topics Concern  . Not on file   Social History Narrative   Not sure about a living will or health care POA   Wife should make health care decisions for him   Would accept resuscitation attempts   Not sure about tube feeds   Review of Systems Appetite is fine No N/V with this Doesn't check BP Sleeping about the same--not great Using melatonin in addition to xanax ---has helped     Objective:   Physical Exam  Constitutional: He appears well-developed and well-nourished. No distress.  Neck: Normal range of motion. Neck supple. No thyromegaly present.  Cardiovascular: Normal rate, regular rhythm and normal heart sounds.  Exam reveals no gallop.   No murmur heard. Pulmonary/Chest: Effort normal and breath sounds normal. No respiratory distress. He has no wheezes. He has no rales.  Abdominal: Soft. He exhibits no distension. There is no tenderness. There is no rebound and no guarding.  Musculoskeletal: He exhibits no edema.  Lymphadenopathy:    He has no cervical adenopathy.  Psychiatric: His behavior is normal.  Usual anxiety and focus on symptoms          Assessment & Plan:

## 2016-05-24 ENCOUNTER — Ambulatory Visit (INDEPENDENT_AMBULATORY_CARE_PROVIDER_SITE_OTHER): Payer: Medicare PPO

## 2016-05-24 DIAGNOSIS — J4540 Moderate persistent asthma, uncomplicated: Secondary | ICD-10-CM

## 2016-05-24 MED ORDER — OMALIZUMAB 150 MG ~~LOC~~ SOLR
375.0000 mg | Freq: Once | SUBCUTANEOUS | Status: AC
  Administered 2016-05-24: 375 mg via SUBCUTANEOUS

## 2016-06-01 ENCOUNTER — Telehealth: Payer: Self-pay | Admitting: Pulmonary Disease

## 2016-06-01 ENCOUNTER — Other Ambulatory Visit: Payer: Self-pay | Admitting: Internal Medicine

## 2016-06-01 DIAGNOSIS — R339 Retention of urine, unspecified: Secondary | ICD-10-CM | POA: Diagnosis not present

## 2016-06-01 DIAGNOSIS — N319 Neuromuscular dysfunction of bladder, unspecified: Secondary | ICD-10-CM | POA: Diagnosis not present

## 2016-06-01 NOTE — Telephone Encounter (Signed)
#   vials:6 Ordered date:06/01/16 Shipping Date:06/02/16

## 2016-06-01 NOTE — Telephone Encounter (Signed)
Last filled 05-04-16 #90 Last OV 05-18-16 Next OV 09-26-16

## 2016-06-02 ENCOUNTER — Other Ambulatory Visit: Payer: Self-pay | Admitting: Internal Medicine

## 2016-06-02 ENCOUNTER — Telehealth: Payer: Self-pay | Admitting: Pulmonary Disease

## 2016-06-02 NOTE — Telephone Encounter (Signed)
Left refill on voice mail at pharmacy  

## 2016-06-02 NOTE — Telephone Encounter (Signed)
Approved: #[id number] x 0 

## 2016-06-02 NOTE — Telephone Encounter (Signed)
Created in error

## 2016-06-02 NOTE — Telephone Encounter (Signed)
Approved: #[id number] x 0 

## 2016-06-02 NOTE — Telephone Encounter (Signed)
#   Vials:6 Arrival Date:06/02/16 Lot [id number] Exp Date:12/20

## 2016-06-02 NOTE — Telephone Encounter (Signed)
Last filled 03-24-16 #120 Last OV 05-18-16 Next OV 09-26-16

## 2016-06-06 DIAGNOSIS — N319 Neuromuscular dysfunction of bladder, unspecified: Secondary | ICD-10-CM | POA: Diagnosis not present

## 2016-06-06 DIAGNOSIS — C689 Malignant neoplasm of urinary organ, unspecified: Secondary | ICD-10-CM | POA: Diagnosis not present

## 2016-06-06 DIAGNOSIS — D414 Neoplasm of uncertain behavior of bladder: Secondary | ICD-10-CM | POA: Diagnosis not present

## 2016-06-08 ENCOUNTER — Ambulatory Visit (INDEPENDENT_AMBULATORY_CARE_PROVIDER_SITE_OTHER): Payer: Medicare PPO

## 2016-06-08 DIAGNOSIS — J4540 Moderate persistent asthma, uncomplicated: Secondary | ICD-10-CM | POA: Diagnosis not present

## 2016-06-09 MED ORDER — OMALIZUMAB 150 MG ~~LOC~~ SOLR
375.0000 mg | Freq: Once | SUBCUTANEOUS | Status: AC
  Administered 2016-06-08: 375 mg via SUBCUTANEOUS

## 2016-06-15 ENCOUNTER — Encounter: Payer: Self-pay | Admitting: Gastroenterology

## 2016-06-23 ENCOUNTER — Ambulatory Visit (INDEPENDENT_AMBULATORY_CARE_PROVIDER_SITE_OTHER): Payer: Medicare PPO

## 2016-06-23 DIAGNOSIS — J4540 Moderate persistent asthma, uncomplicated: Secondary | ICD-10-CM

## 2016-06-24 ENCOUNTER — Ambulatory Visit: Payer: Medicare PPO

## 2016-06-24 MED ORDER — OMALIZUMAB 150 MG ~~LOC~~ SOLR
375.0000 mg | SUBCUTANEOUS | Status: DC
  Administered 2016-06-23: 375 mg via SUBCUTANEOUS

## 2016-06-28 DIAGNOSIS — X32XXXA Exposure to sunlight, initial encounter: Secondary | ICD-10-CM | POA: Diagnosis not present

## 2016-06-28 DIAGNOSIS — L400 Psoriasis vulgaris: Secondary | ICD-10-CM | POA: Diagnosis not present

## 2016-06-28 DIAGNOSIS — M0579 Rheumatoid arthritis with rheumatoid factor of multiple sites without organ or systems involvement: Secondary | ICD-10-CM | POA: Diagnosis not present

## 2016-06-28 DIAGNOSIS — L821 Other seborrheic keratosis: Secondary | ICD-10-CM | POA: Diagnosis not present

## 2016-06-28 DIAGNOSIS — L570 Actinic keratosis: Secondary | ICD-10-CM | POA: Diagnosis not present

## 2016-06-29 ENCOUNTER — Telehealth: Payer: Self-pay | Admitting: Pulmonary Disease

## 2016-06-29 ENCOUNTER — Other Ambulatory Visit: Payer: Self-pay | Admitting: Internal Medicine

## 2016-06-29 ENCOUNTER — Telehealth: Payer: Self-pay | Admitting: Internal Medicine

## 2016-06-29 NOTE — Telephone Encounter (Signed)
#   vials:6 Ordered date:06/29/16 Shipping Date:06/29/16

## 2016-06-29 NOTE — Telephone Encounter (Signed)
Created in error

## 2016-06-30 NOTE — Telephone Encounter (Signed)
Alprazolam left on voice mail at the pharmacy. Furosemide sent electronically

## 2016-06-30 NOTE — Telephone Encounter (Signed)
Approved: #[id number] x 0 

## 2016-06-30 NOTE — Telephone Encounter (Signed)
#   Vials:6 Arrival Date:06/30/16 Lot [id number] Exp Date:3/21

## 2016-06-30 NOTE — Telephone Encounter (Signed)
Alprazolam last filled 06-02-16 #90 Last OV 05-18-16 Next OV 09-26-16.  I built furosemide for 30/11 since his CMET was normal at 05-18-16 OV. Will send it when alprazolam is approved

## 2016-07-01 DIAGNOSIS — N3040 Irradiation cystitis without hematuria: Secondary | ICD-10-CM | POA: Diagnosis not present

## 2016-07-05 DIAGNOSIS — M0579 Rheumatoid arthritis with rheumatoid factor of multiple sites without organ or systems involvement: Secondary | ICD-10-CM | POA: Diagnosis not present

## 2016-07-05 DIAGNOSIS — M25512 Pain in left shoulder: Secondary | ICD-10-CM | POA: Diagnosis not present

## 2016-07-05 DIAGNOSIS — M25511 Pain in right shoulder: Secondary | ICD-10-CM | POA: Diagnosis not present

## 2016-07-05 DIAGNOSIS — G8929 Other chronic pain: Secondary | ICD-10-CM | POA: Diagnosis not present

## 2016-07-08 ENCOUNTER — Ambulatory Visit (INDEPENDENT_AMBULATORY_CARE_PROVIDER_SITE_OTHER): Payer: Medicare PPO

## 2016-07-08 DIAGNOSIS — J4540 Moderate persistent asthma, uncomplicated: Secondary | ICD-10-CM

## 2016-07-08 MED ORDER — OMALIZUMAB 150 MG ~~LOC~~ SOLR
375.0000 mg | SUBCUTANEOUS | Status: DC
  Administered 2016-07-08: 375 mg via SUBCUTANEOUS

## 2016-07-11 ENCOUNTER — Other Ambulatory Visit: Payer: Self-pay | Admitting: Internal Medicine

## 2016-07-12 NOTE — Telephone Encounter (Signed)
Refill sent to pharmacy as instructed. 

## 2016-07-12 NOTE — Telephone Encounter (Signed)
Approved: okay to refill for a year 

## 2016-07-12 NOTE — Telephone Encounter (Signed)
Received refill electronically Last refill 02/25/16 #90/2 Last office visit 05/18/16

## 2016-07-25 ENCOUNTER — Other Ambulatory Visit: Payer: Self-pay | Admitting: Internal Medicine

## 2016-07-25 ENCOUNTER — Ambulatory Visit (INDEPENDENT_AMBULATORY_CARE_PROVIDER_SITE_OTHER): Payer: Medicare PPO

## 2016-07-25 DIAGNOSIS — J4540 Moderate persistent asthma, uncomplicated: Secondary | ICD-10-CM

## 2016-07-25 NOTE — Telephone Encounter (Signed)
Received refill electronically Last office visit 05/18/16 Last refill 06/30/16 #90 See allergy/contraindiction

## 2016-07-25 NOTE — Telephone Encounter (Signed)
Left refill on voice mail at pharmacy  

## 2016-07-25 NOTE — Telephone Encounter (Signed)
Approved: okay #[id number] x 0 

## 2016-07-27 MED ORDER — OMALIZUMAB 150 MG ~~LOC~~ SOLR
150.0000 mg | SUBCUTANEOUS | Status: DC
  Administered 2016-07-27: 375 mg via SUBCUTANEOUS

## 2016-07-29 ENCOUNTER — Telehealth: Payer: Self-pay

## 2016-07-29 DIAGNOSIS — N319 Neuromuscular dysfunction of bladder, unspecified: Secondary | ICD-10-CM | POA: Diagnosis not present

## 2016-07-29 NOTE — Telephone Encounter (Signed)
Dr Fuller Plan,      At pt's last OV with you, you noted worsening constipation.  He had a good prep with MOVI last time in 2011 but pt stated worse over last 3-5 years.  Would you like him to have a 2 day prep?                                                                    Thank you,                                                                           Charron Coultas/PV

## 2016-08-02 ENCOUNTER — Ambulatory Visit (AMBULATORY_SURGERY_CENTER): Payer: Self-pay

## 2016-08-02 ENCOUNTER — Telehealth: Payer: Self-pay | Admitting: Pulmonary Disease

## 2016-08-02 VITALS — Ht 71.5 in | Wt 230.4 lb

## 2016-08-02 DIAGNOSIS — K5900 Constipation, unspecified: Secondary | ICD-10-CM

## 2016-08-02 DIAGNOSIS — R194 Change in bowel habit: Secondary | ICD-10-CM

## 2016-08-02 MED ORDER — SUPREP BOWEL PREP KIT 17.5-3.13-1.6 GM/177ML PO SOLN: 1.0000 | Freq: Once | ORAL | 0 refills | Status: AC

## 2016-08-02 NOTE — Telephone Encounter (Signed)
2 day prep please 

## 2016-08-03 ENCOUNTER — Telehealth: Payer: Self-pay | Admitting: Pulmonary Disease

## 2016-08-03 ENCOUNTER — Telehealth: Payer: Self-pay

## 2016-08-03 NOTE — Telephone Encounter (Signed)
Pt left v/m; pt went for preop visit for colonoscopy and was told it is on pts chart that he has chronic kidney disease. Pt states he does not have chronic kidney disease. Pt was advised that only Dr Silvio Pate could take this off pts chart. Pt request cb.

## 2016-08-03 NOTE — Telephone Encounter (Signed)
Patient calling back regarding Xolair billing - wants to speak to only [name] -pr

## 2016-08-03 NOTE — Telephone Encounter (Signed)
LM for pt x 1  

## 2016-08-04 NOTE — Telephone Encounter (Addendum)
#   vials:6 Ordered date:08/03/16 Shipping Date:08/03/16 I didn't get the sheet until today so I couldn't put them in before now.

## 2016-08-04 NOTE — Telephone Encounter (Signed)
#   Vials:6 Arrival Date:08/04/16 Lot [id number] Exp Date:3/21

## 2016-08-04 NOTE — Telephone Encounter (Signed)
Please let him know I took it off. Someone put in on the history in relation to the bladder problems. He is correct--he doesn't have chronic kidney disease

## 2016-08-05 NOTE — Telephone Encounter (Signed)
Pt calling again - requests a call back ASAP today from [place].

## 2016-08-05 NOTE — Telephone Encounter (Signed)
Pt is aware that I am working with his [employer] regarding any PA's or not payment issues. Will keep him updated as I receive new updates.

## 2016-08-08 ENCOUNTER — Other Ambulatory Visit: Payer: Self-pay | Admitting: Internal Medicine

## 2016-08-08 ENCOUNTER — Ambulatory Visit (INDEPENDENT_AMBULATORY_CARE_PROVIDER_SITE_OTHER): Payer: Medicare PPO | Admitting: Internal Medicine

## 2016-08-08 ENCOUNTER — Encounter: Payer: Self-pay | Admitting: Internal Medicine

## 2016-08-08 VITALS — BP 102/60 | HR 60 | Temp 97.8°F | Wt 227.0 lb

## 2016-08-08 DIAGNOSIS — K5909 Other constipation: Secondary | ICD-10-CM | POA: Diagnosis not present

## 2016-08-08 DIAGNOSIS — Z23 Encounter for immunization: Secondary | ICD-10-CM | POA: Diagnosis not present

## 2016-08-08 DIAGNOSIS — J4550 Severe persistent asthma, uncomplicated: Secondary | ICD-10-CM | POA: Diagnosis not present

## 2016-08-08 NOTE — Assessment & Plan Note (Signed)
Some increased secretions--probably from allergies Reassured--lungs sound fine

## 2016-08-08 NOTE — Progress Notes (Signed)
Subjective:    Patient ID: [name], male    DOB: 11-06-1934, 82 y.o.   MRN: [id number]  HPI Here with wife for several concerns Has the colonoscopy coming up Has 2 day prep --he is concerned about the complexity and some specifics Can't tolerate 2 dulcolax at one time-- gets incontinence without warning Then another dose later that night The next day is the "super prep" in the evening and then again the morning of the procedure  Current Outpatient Prescriptions on File Prior to Visit  Medication Sig Dispense Refill  . ALPRAZolam (XANAX) 0.5 MG tablet TAKE 1 TABLET BY MOUTH THREE TIMES DAILY AS NEEDED FOR ANXIETY 90 tablet 0  . aspirin 81 MG tablet Take 81 mg by mouth daily.    . Cholecalciferol (VITAMIN D3) 5000 UNITS CAPS Take 1 capsule by mouth daily.    . Coenzyme Q10 (CO Q 10) 100 MG CAPS Take 1 capsule by mouth daily.    . Cranberry 425 MG CAPS Take 2 capsules by mouth daily.    . fluticasone (FLONASE) 50 MCG/ACT nasal spray Place 2 sprays into both nostrils 2 (two) times daily.    Marland Kitchen FOLIC ACID PO Take [id number] mcg by mouth daily.     . furosemide (LASIX) 20 MG tablet TAKE 1 TABLET BY MOUTH EVERY DAY 30 tablet 11  . gabapentin (NEURONTIN) 300 MG capsule TAKE 1 CAPSULE BY MOUTH THREE TIMES DAILY 90 capsule 11  . hydroxychloroquine (PLAQUENIL) 200 MG tablet Take 1 tablet by mouth 2 (two) times daily.    . methotrexate (RHEUMATREX) 2.5 MG tablet Take 10 tablets by mouth once a week.    . metoprolol succinate (TOPROL-XL) 50 MG 24 hr tablet TAKE 1 TABLET BY MOUTH EVERY DAY 90 tablet 3  . Milk Thistle 250 MG CAPS Take 1 capsule by mouth daily.    . Multiple Vitamin (MULTIVITAMIN) tablet Take 1 tablet by mouth daily.      . Omega-3 Fatty Acids (FISH OIL) 1200 MG CAPS Take 2 capsules by mouth daily.    Marland Kitchen omeprazole (PRILOSEC) 20 MG capsule TAKE 1 CAPSULE BY MOUTH EVERY DAY 90 capsule 3  . polyethylene glycol (GLYCOLAX) packet Take 34 g by mouth daily.     . pravastatin (PRAVACHOL) 20  MG tablet TAKE 1 TABLET BY MOUTH EVERY DAY 90 tablet 1  . Probiotic Product (ALIGN) 4 MG CAPS Take 1 capsule by mouth daily.     . Selenium 200 MCG CAPS Take 1 capsule by mouth daily.    . solifenacin (VESICARE) 5 MG tablet Take 5 mg by mouth at bedtime. For bladder spasms    . traMADol (ULTRAM) 50 MG tablet TAKE 1 TABLET BY MOUTH FOUR TIMES DAILY 120 tablet 0  . Turmeric 500 MG CAPS Take 500 mg by mouth daily.    . vitamin B-12 (CYANOCOBALAMIN) 500 MCG tablet Take 500 mcg by mouth daily.     Current Facility-Administered Medications on File Prior to Visit  Medication Dose Route Frequency Provider Last Rate Last Dose  . omalizumab Arvid Right) injection 150 mg  150 mg Subcutaneous Q14 Days Chesley Mires, MD   375 mg at 07/27/16 1155  . omalizumab Arvid Right) injection 375 mg  375 mg Subcutaneous Q14 Days Chesley Mires, MD   375 mg at 06/23/16 1241  . omalizumab Arvid Right) injection 375 mg  375 mg Subcutaneous Q14 Days Chesley Mires, MD   375 mg at 07/08/16 1608    Allergies  Allergen Reactions  .  Ciprofloxacin Nausea And Vomiting    Headache  . Citalopram Hydrobromide Other (See Comments)    "unknown"  . Lorazepam     Adverse reaction  . Paroxetine Nausea Only  . Ramipril Other (See Comments)    "unknown"  . Simvastatin     Past Medical History:  Diagnosis Date  . Allergy   . Anemia   . Anxiety   . Arthritis    rheumatoid  . Asthma   . CAD (coronary artery disease)   . Cancer [facility])    Prostate  . Chronic sinusitis   . Depression   . Diverticulitis   . ED (erectile dysfunction)   . GERD (gastroesophageal reflux disease)   . History of SIADH   . Hx of adenomatous colonic polyps   . Hypertension   . Hyponatremia   . IBS (irritable bowel syndrome)   . Neurodermatitis   . Neurogenic bladder   . OSA (obstructive sleep apnea)     Past Surgical History:  Procedure Laterality Date  . KNEE ARTHROPLASTY Right 09/02/2015   Procedure: COMPUTER ASSISTED TOTAL KNEE ARTHROPLASTY;  Surgeon:  [name], MD;  Location: [facility] ORS;  Service: Orthopedics;  Laterality: Right;  . KNEE SURGERY Left    ARTHROSCOPY LEFT  . NASAL SINUS SURGERY  2009   DEVIATED SEPTUM AND POLYPS  . PROSTATE SURGERY     PROSTATECTOMY  . TOTAL KNEE ARTHROPLASTY Left 9/15   Dr [name]  . URETHRAL STRICTURE DILATATION  02-2010   Dr.Cope    Family History  Problem Relation Age of Onset  . Heart disease Mother 93    heart failure  . Pneumonia Father 52    pnemonia  . Colon cancer Neg Hx     Social History   Social History  . Marital status: Married    Spouse name: N/A  . Number of children: 2  . Years of education: N/A   Occupational History  . Radio station owner     retired   Social History Main Topics  . Smoking status: Never Smoker  . Smokeless tobacco: Never Used  . Alcohol use 6.0 oz/week    10 Standard drinks or equivalent per week  . Drug use: No  . Sexual activity: Not on file   Other Topics Concern  . Not on file   Social History Narrative   Not sure about a living will or health care POA   Wife should make health care decisions for him   Would accept resuscitation attempts   Not sure about tube feeds   Review of Systems       Objective:   Physical Exam  Psychiatric:  Usual anxiety Clarified some of the instructions and reviewed variations for him          Assessment & Plan:

## 2016-08-08 NOTE — Progress Notes (Signed)
   Subjective:    Patient ID: [name], male    DOB: 1934/02/26, 82 y.o.   MRN: [id number]  HPI  Also notes some cough No fever or sig SOB Has not really had this since on xolair Some mucus  Also has some flushing of his cheeks--but only happens when he drinks  Review of Systems     Objective:   Physical Exam  Cardiovascular: Normal rate and regular rhythm.  Exam reveals no gallop.   No murmur heard. Pulmonary/Chest: Effort normal. No respiratory distress. He has no wheezes. He has no rales.  Slight inspiratory rhonchi (?upper airway)          Assessment & Plan:

## 2016-08-08 NOTE — Assessment & Plan Note (Signed)
Getting the colonoscopy finally Has extensive prep Reviewed the instructions at length and discussed potential variations based on his reaction to dulcolax, etc in the past

## 2016-08-08 NOTE — Progress Notes (Signed)
Pre visit review using our clinic review tool, if applicable. No additional management support is needed unless otherwise documented below in the visit note. 

## 2016-08-08 NOTE — Telephone Encounter (Signed)
Last filled 06-02-16 #120 Last OV 08-08-16 Next OV 09-26-16

## 2016-08-08 NOTE — Addendum Note (Signed)
Addended by: Pilar Grammes on: 08/08/2016 05:37 PM   Modules accepted: Orders

## 2016-08-09 ENCOUNTER — Ambulatory Visit: Payer: Medicare PPO

## 2016-08-09 NOTE — Telephone Encounter (Signed)
Left refill on voice mail at pharmacy  

## 2016-08-09 NOTE — Telephone Encounter (Signed)
Approved: #[id number] x 0 

## 2016-08-10 ENCOUNTER — Ambulatory Visit (INDEPENDENT_AMBULATORY_CARE_PROVIDER_SITE_OTHER): Payer: Medicare PPO

## 2016-08-10 DIAGNOSIS — J4540 Moderate persistent asthma, uncomplicated: Secondary | ICD-10-CM

## 2016-08-11 MED ORDER — OMALIZUMAB 150 MG ~~LOC~~ SOLR
375.0000 mg | Freq: Once | SUBCUTANEOUS | Status: AC
  Administered 2016-08-11: 375 mg via SUBCUTANEOUS

## 2016-08-15 NOTE — Telephone Encounter (Signed)
No please keep open as this takes time to get approval.

## 2016-08-15 NOTE — Telephone Encounter (Signed)
[name] please advise if this encounter can be closed.  Thanks.  

## 2016-08-16 ENCOUNTER — Encounter: Payer: Self-pay | Admitting: Gastroenterology

## 2016-08-16 ENCOUNTER — Ambulatory Visit (AMBULATORY_SURGERY_CENTER): Payer: Medicare PPO | Admitting: Gastroenterology

## 2016-08-16 VITALS — BP 152/65 | HR 60 | Temp 98.2°F | Resp 24 | Ht 71.0 in | Wt 230.0 lb

## 2016-08-16 DIAGNOSIS — R194 Change in bowel habit: Secondary | ICD-10-CM

## 2016-08-16 DIAGNOSIS — J45909 Unspecified asthma, uncomplicated: Secondary | ICD-10-CM | POA: Diagnosis not present

## 2016-08-16 DIAGNOSIS — K5900 Constipation, unspecified: Secondary | ICD-10-CM

## 2016-08-16 DIAGNOSIS — I2510 Atherosclerotic heart disease of native coronary artery without angina pectoris: Secondary | ICD-10-CM | POA: Diagnosis not present

## 2016-08-16 DIAGNOSIS — I10 Essential (primary) hypertension: Secondary | ICD-10-CM | POA: Diagnosis not present

## 2016-08-16 MED ORDER — SODIUM CHLORIDE 0.9 % IV SOLN: 500.0000 mL | INTRAVENOUS | Status: DC

## 2016-08-16 NOTE — Op Note (Signed)
[facility] Patient Name: [name] Procedure Date: 08/16/2016 1:29 PM MRN: [id number] Endoscopist: Ladene Artist , MD Age: 82 Referring MD:  Date of Birth: [birth year] Gender: Male Account #: [id number] Procedure:                Colonoscopy Indications:              Change in bowel habits, Constipation Medicines:                Monitored Anesthesia Care Procedure:                Pre-Anesthesia Assessment:                           - Prior to the procedure, a History and Physical                            was performed, and patient medications and                            allergies were reviewed. The patient's tolerance of                            previous anesthesia was also reviewed. The risks                            and benefits of the procedure and the sedation                            options and risks were discussed with the patient.                            All questions were answered, and informed consent                            was obtained. Prior Anticoagulants: The patient has                            taken no previous anticoagulant or antiplatelet                            agents. ASA Grade Assessment: II - A patient with                            mild systemic disease. After reviewing the risks                            and benefits, the patient was deemed in                            satisfactory condition to undergo the procedure.                           After obtaining informed consent, the colonoscope  was passed under direct vision. Throughout the                            procedure, the patient's blood pressure, pulse, and                            oxygen saturations were monitored continuously. The                            Model PCF-[id number]) scope was introduced                            through the anus and advanced to the the cecum,                            identified by appendiceal  orifice and ileocecal                            valve. The ileocecal valve, appendiceal orifice,                            and rectum were photographed. The quality of the                            bowel preparation was excellent. The colonoscopy                            was performed without difficulty. The patient                            tolerated the procedure well. Scope In: 1:46:27 PM Scope Out: 2:00:08 PM Scope Withdrawal Time: 0 hours 9 minutes 44 seconds  Total Procedure Duration: 0 hours 13 minutes 41 seconds  Findings:                 The perianal and digital rectal examinations were                            normal.                           Many medium-mouthed diverticula were found in the                            sigmoid colon and descending colon. There was                            narrowing of the colon in association with the                            diverticular opening. There was evidence of                            diverticular spasm. There was no evidence of  diverticular bleeding.                           The exam was otherwise without abnormality on                            direct and retroflexion views. Complications:            No immediate complications. Estimated blood loss:                            None. Estimated Blood Loss:     Estimated blood loss: none. Impression:               - Moderate diverticulosis in the sigmoid colon and                            in the descending colon.                           - The examination was otherwise normal on direct                            and retroflexion views.                           - No specimens collected. Recommendation:           - Patient has a contact number available for                            emergencies. The signs and symptoms of potential                            delayed complications were discussed with the                            patient.  Return to normal activities tomorrow.                            Written discharge instructions were provided to the                            patient.                           - Resume previous diet.                           - Continue present medications.                           - No repeat colonoscopy due to age. Ladene Artist, MD 08/16/2016 2:05:30 PM This report has been signed electronically.

## 2016-08-16 NOTE — Patient Instructions (Signed)
Discharge instructions given. Handouts on diverticulosis and a high fiber diet. Resume previous medications. YOU HAD AN ENDOSCOPIC PROCEDURE TODAY AT THE [facility]:   Refer to the procedure report that was given to you for any specific questions about what was found during the examination.  If the procedure report does not answer your questions, please call your gastroenterologist to clarify.  If you requested that your care partner not be given the details of your procedure findings, then the procedure report has been included in a sealed envelope for you to review at your convenience later.  YOU SHOULD EXPECT: Some feelings of bloating in the abdomen. Passage of more gas than usual.  Walking can help get rid of the air that was put into your GI tract during the procedure and reduce the bloating. If you had a lower endoscopy (such as a colonoscopy or flexible sigmoidoscopy) you may notice spotting of blood in your stool or on the toilet paper. If you underwent a bowel prep for your procedure, you may not have a normal bowel movement for a few days.  Please Note:  You might notice some irritation and congestion in your nose or some drainage.  This is from the oxygen used during your procedure.  There is no need for concern and it should clear up in a day or so.  SYMPTOMS TO REPORT IMMEDIATELY:   Following lower endoscopy (colonoscopy or flexible sigmoidoscopy):  Excessive amounts of blood in the stool  Significant tenderness or worsening of abdominal pains  Swelling of the abdomen that is new, acute  Fever of 100F or higher   For urgent or emergent issues, a gastroenterologist can be reached at any hour by calling [phone].   DIET:  We do recommend a small meal at first, but then you may proceed to your regular diet.  Drink plenty of fluids but you should avoid alcoholic beverages for 24 hours.  ACTIVITY:  You should plan to take it easy for the rest of today and you  should NOT DRIVE or use heavy machinery until tomorrow (because of the sedation medicines used during the test).    FOLLOW UP: Our staff will call the number listed on your records the next business day following your procedure to check on you and address any questions or concerns that you may have regarding the information given to you following your procedure. If we do not reach you, we will leave a message.  However, if you are feeling well and you are not experiencing any problems, there is no need to return our call.  We will assume that you have returned to your regular daily activities without incident.  If any biopsies were taken you will be contacted by phone or by letter within the next 1-3 weeks.  Please call us at [phone] if you have not heard about the biopsies in 3 weeks.    SIGNATURES/CONFIDENTIALITY: You and/or your care partner have signed paperwork which will be entered into your electronic medical record.  These signatures attest to the fact that that the information above on your After Visit Summary has been reviewed and is understood.  Full responsibility of the confidentiality of this discharge information lies with you and/or your care-partner. 

## 2016-08-16 NOTE — Progress Notes (Signed)
To recovery, report to McCoy, RN, VSS 

## 2016-08-17 ENCOUNTER — Telehealth: Payer: Self-pay | Admitting: *Deleted

## 2016-08-17 NOTE — Telephone Encounter (Signed)
  Follow up Call-  Call back number 08/16/2016  Post procedure Call Back phone  # [phone]  Permission to leave phone message Yes  Some recent data might be hidden     Patient questions:  Do you have a fever, pain , or abdominal swelling? No. Pain Score  0 *  Have you tolerated food without any problems? Yes.    Have you been able to return to your normal activities? Yes.    Do you have any questions about your discharge instructions: Diet   No. Medications  No. Follow up visit  No.  Do you have questions or concerns about your Care? No.  Actions: * If pain score is 4 or above: No action needed, pain <4.

## 2016-08-18 NOTE — Telephone Encounter (Signed)
Pt is calling about this message. Would like a call back from [facility] [place] only.

## 2016-08-22 ENCOUNTER — Telehealth: Payer: Self-pay | Admitting: Internal Medicine

## 2016-08-22 NOTE — Telephone Encounter (Signed)
Patient called to let Dr.Letvak know that he had his Colonoscopy done on 08/16/16 and everything was fine and unremarkable.

## 2016-08-22 NOTE — Telephone Encounter (Signed)
I saw the report! Let him know it is so reassuring---he should try to relax about his bowels now!

## 2016-08-23 NOTE — Telephone Encounter (Signed)
[name], please advise on update thanks

## 2016-08-25 ENCOUNTER — Ambulatory Visit (INDEPENDENT_AMBULATORY_CARE_PROVIDER_SITE_OTHER): Payer: Medicare PPO

## 2016-08-25 DIAGNOSIS — J4540 Moderate persistent asthma, uncomplicated: Secondary | ICD-10-CM

## 2016-08-25 NOTE — Telephone Encounter (Signed)
Spoke with patient on 08/24/16-he is aware that his Insurance is deciding on approval needed through Medical or pharmacy benefits and then hung up on me. Pt aware that I have direct phone number to contact dept for PA approval and have it back dated.

## 2016-08-26 ENCOUNTER — Telehealth: Payer: Self-pay | Admitting: Pulmonary Disease

## 2016-08-26 DIAGNOSIS — N319 Neuromuscular dysfunction of bladder, unspecified: Secondary | ICD-10-CM | POA: Diagnosis not present

## 2016-08-26 MED ORDER — OMALIZUMAB 150 MG ~~LOC~~ SOLR
375.0000 mg | Freq: Once | SUBCUTANEOUS | Status: AC
  Administered 2016-08-25: 375 mg via SUBCUTANEOUS

## 2016-08-26 NOTE — Telephone Encounter (Signed)
#   vials:6 Ordered date:08/26/16 Shipping Date:08/29/16

## 2016-08-29 ENCOUNTER — Other Ambulatory Visit: Payer: Self-pay | Admitting: Internal Medicine

## 2016-08-29 NOTE — Telephone Encounter (Signed)
#   Vials:6 Arrival Date:08/29/16 Lot [id number] Exp Date:4/21

## 2016-08-29 NOTE — Telephone Encounter (Signed)
Approved: #[id number] x 0 

## 2016-08-29 NOTE — Telephone Encounter (Signed)
Last called in 07-25-16 #90 Last OV Acute 08-08-16 Next OV 09-26-16

## 2016-08-29 NOTE — Telephone Encounter (Signed)
Left refill on voice mail at pharmacy  

## 2016-08-31 DIAGNOSIS — N319 Neuromuscular dysfunction of bladder, unspecified: Secondary | ICD-10-CM | POA: Diagnosis not present

## 2016-08-31 DIAGNOSIS — R339 Retention of urine, unspecified: Secondary | ICD-10-CM | POA: Diagnosis not present

## 2016-09-09 ENCOUNTER — Ambulatory Visit (INDEPENDENT_AMBULATORY_CARE_PROVIDER_SITE_OTHER): Payer: Medicare PPO

## 2016-09-09 ENCOUNTER — Telehealth: Payer: Self-pay | Admitting: Internal Medicine

## 2016-09-09 DIAGNOSIS — J4540 Moderate persistent asthma, uncomplicated: Secondary | ICD-10-CM

## 2016-09-09 MED ORDER — OMALIZUMAB 150 MG ~~LOC~~ SOLR
375.0000 mg | Freq: Once | SUBCUTANEOUS | Status: AC
  Administered 2016-09-09: 375 mg via SUBCUTANEOUS

## 2016-09-09 NOTE — Progress Notes (Signed)
15 of 75 Xolair was waste. Pt only received 375mg .

## 2016-09-09 NOTE — Telephone Encounter (Signed)
Pt is aware that I am still working with his [employer] for [employer] and payment. Pt is also aware that I am speaking with Kathlee Nations next week to seek out assistance with any bills that pt should be getting.

## 2016-09-09 NOTE — Telephone Encounter (Signed)
[name] can you give an update on this please. Thanks.

## 2016-09-21 DIAGNOSIS — E871 Hypo-osmolality and hyponatremia: Secondary | ICD-10-CM | POA: Diagnosis not present

## 2016-09-21 DIAGNOSIS — R600 Localized edema: Secondary | ICD-10-CM | POA: Diagnosis not present

## 2016-09-26 ENCOUNTER — Ambulatory Visit: Payer: Medicare PPO | Admitting: Internal Medicine

## 2016-09-26 NOTE — Telephone Encounter (Signed)
[name] please advise on status.  thanks

## 2016-09-27 DIAGNOSIS — Z129 Encounter for screening for malignant neoplasm, site unspecified: Secondary | ICD-10-CM | POA: Diagnosis not present

## 2016-09-27 DIAGNOSIS — N319 Neuromuscular dysfunction of bladder, unspecified: Secondary | ICD-10-CM | POA: Diagnosis not present

## 2016-09-27 DIAGNOSIS — N3090 Cystitis, unspecified without hematuria: Secondary | ICD-10-CM | POA: Diagnosis not present

## 2016-09-27 DIAGNOSIS — D414 Neoplasm of uncertain behavior of bladder: Secondary | ICD-10-CM | POA: Diagnosis not present

## 2016-09-27 NOTE — Telephone Encounter (Signed)
[name] please advise. Thanks. 

## 2016-09-28 ENCOUNTER — Ambulatory Visit (INDEPENDENT_AMBULATORY_CARE_PROVIDER_SITE_OTHER): Payer: Medicare PPO

## 2016-09-28 DIAGNOSIS — J4520 Mild intermittent asthma, uncomplicated: Secondary | ICD-10-CM

## 2016-09-28 DIAGNOSIS — Q828 Other specified congenital malformations of skin: Secondary | ICD-10-CM | POA: Diagnosis not present

## 2016-09-28 DIAGNOSIS — D485 Neoplasm of uncertain behavior of skin: Secondary | ICD-10-CM | POA: Diagnosis not present

## 2016-09-28 DIAGNOSIS — L400 Psoriasis vulgaris: Secondary | ICD-10-CM | POA: Diagnosis not present

## 2016-09-29 MED ORDER — OMALIZUMAB 150 MG ~~LOC~~ SOLR
375.0000 mg | SUBCUTANEOUS | Status: DC
  Administered 2016-09-28: 375 mg via SUBCUTANEOUS

## 2016-09-29 NOTE — Progress Notes (Signed)
[name] came in on 09/28/2016 to receive a Xolair injection. The patient is given 375mg mg every 14 days. Due to each vial equalling 150mg , 75mg  of medication was wasted.

## 2016-10-04 NOTE — Telephone Encounter (Signed)
[name] can you please give an update on this message? Thanks.

## 2016-10-05 DIAGNOSIS — I2510 Atherosclerotic heart disease of native coronary artery without angina pectoris: Secondary | ICD-10-CM | POA: Diagnosis not present

## 2016-10-05 DIAGNOSIS — J90 Pleural effusion, not elsewhere classified: Secondary | ICD-10-CM | POA: Diagnosis not present

## 2016-10-05 DIAGNOSIS — N319 Neuromuscular dysfunction of bladder, unspecified: Secondary | ICD-10-CM | POA: Diagnosis not present

## 2016-10-05 DIAGNOSIS — Z8546 Personal history of malignant neoplasm of prostate: Secondary | ICD-10-CM | POA: Diagnosis not present

## 2016-10-05 DIAGNOSIS — D414 Neoplasm of uncertain behavior of bladder: Secondary | ICD-10-CM | POA: Diagnosis not present

## 2016-10-05 DIAGNOSIS — J4520 Mild intermittent asthma, uncomplicated: Secondary | ICD-10-CM | POA: Insufficient documentation

## 2016-10-05 DIAGNOSIS — J9811 Atelectasis: Secondary | ICD-10-CM | POA: Diagnosis not present

## 2016-10-05 DIAGNOSIS — F419 Anxiety disorder, unspecified: Secondary | ICD-10-CM | POA: Diagnosis not present

## 2016-10-05 DIAGNOSIS — Z9181 History of falling: Secondary | ICD-10-CM | POA: Diagnosis not present

## 2016-10-05 DIAGNOSIS — M0579 Rheumatoid arthritis with rheumatoid factor of multiple sites without organ or systems involvement: Secondary | ICD-10-CM | POA: Diagnosis not present

## 2016-10-05 DIAGNOSIS — Z01818 Encounter for other preprocedural examination: Secondary | ICD-10-CM | POA: Diagnosis not present

## 2016-10-05 DIAGNOSIS — R918 Other nonspecific abnormal finding of lung field: Secondary | ICD-10-CM | POA: Diagnosis not present

## 2016-10-05 NOTE — Telephone Encounter (Signed)
[name] can you give an update on this? Thanks. 

## 2016-10-06 ENCOUNTER — Telehealth: Payer: Self-pay | Admitting: Pulmonary Disease

## 2016-10-06 NOTE — Telephone Encounter (Signed)
#   vials:6 Ordered date:10/06/16 Shipping Date:10/07/16

## 2016-10-07 NOTE — Telephone Encounter (Signed)
#   Vials:6 Arrival Date:10/07/16 Lot [id number] Exp Date:4/21

## 2016-10-10 ENCOUNTER — Other Ambulatory Visit: Payer: Self-pay | Admitting: Internal Medicine

## 2016-10-10 DIAGNOSIS — R3914 Feeling of incomplete bladder emptying: Secondary | ICD-10-CM | POA: Diagnosis not present

## 2016-10-10 DIAGNOSIS — D414 Neoplasm of uncertain behavior of bladder: Secondary | ICD-10-CM | POA: Diagnosis not present

## 2016-10-10 DIAGNOSIS — J45909 Unspecified asthma, uncomplicated: Secondary | ICD-10-CM | POA: Diagnosis not present

## 2016-10-10 DIAGNOSIS — Z7982 Long term (current) use of aspirin: Secondary | ICD-10-CM | POA: Diagnosis not present

## 2016-10-10 DIAGNOSIS — F419 Anxiety disorder, unspecified: Secondary | ICD-10-CM | POA: Diagnosis not present

## 2016-10-10 DIAGNOSIS — I2510 Atherosclerotic heart disease of native coronary artery without angina pectoris: Secondary | ICD-10-CM | POA: Diagnosis not present

## 2016-10-10 DIAGNOSIS — N319 Neuromuscular dysfunction of bladder, unspecified: Secondary | ICD-10-CM | POA: Diagnosis not present

## 2016-10-10 DIAGNOSIS — D494 Neoplasm of unspecified behavior of bladder: Secondary | ICD-10-CM | POA: Diagnosis not present

## 2016-10-10 DIAGNOSIS — N3020 Other chronic cystitis without hematuria: Secondary | ICD-10-CM | POA: Diagnosis not present

## 2016-10-10 DIAGNOSIS — Z79899 Other long term (current) drug therapy: Secondary | ICD-10-CM | POA: Diagnosis not present

## 2016-10-10 DIAGNOSIS — N3090 Cystitis, unspecified without hematuria: Secondary | ICD-10-CM | POA: Diagnosis not present

## 2016-10-10 DIAGNOSIS — E785 Hyperlipidemia, unspecified: Secondary | ICD-10-CM | POA: Diagnosis not present

## 2016-10-10 NOTE — Telephone Encounter (Signed)
Will refill in PCP absence Px written for call in   Thanks

## 2016-10-10 NOTE — Telephone Encounter (Signed)
Rx called in as prescribed 

## 2016-10-10 NOTE — Telephone Encounter (Signed)
Last filled 08-29-16 #90 Last OV 08-08-16 No Future OV  Forward to Dr Glori Bickers in Dr Alla German absence

## 2016-10-13 ENCOUNTER — Ambulatory Visit (INDEPENDENT_AMBULATORY_CARE_PROVIDER_SITE_OTHER): Payer: Medicare PPO

## 2016-10-13 DIAGNOSIS — J4520 Mild intermittent asthma, uncomplicated: Secondary | ICD-10-CM

## 2016-10-14 ENCOUNTER — Telehealth: Payer: Self-pay | Admitting: Internal Medicine

## 2016-10-14 MED ORDER — OMALIZUMAB 150 MG ~~LOC~~ SOLR
375.0000 mg | SUBCUTANEOUS | Status: DC
  Administered 2016-10-13: 375 mg via SUBCUTANEOUS

## 2016-10-14 NOTE — Progress Notes (Signed)
[name] came in on 10/13/2016 to receive a Xolair injection. The patient is given 375mg  every 14 days. Due to each vial equalling 150mg , 75mg  of medication was wasted.

## 2016-10-14 NOTE — Telephone Encounter (Signed)
Returned pt. call I called and spoke with the patient. He stated that he received a letter that stated that it was denied for March 2nd. He stated he was given a letter that stated he was covered for 2 years and that Joellen Jersey has a copy of this. Also wanted to discuss if he needs a follow up appointment with Dr. Halford Chessman.  He is requesting to speak with [facility] will be forward to [facility] to follow up on this.

## 2016-10-18 NOTE — Telephone Encounter (Signed)
Spoke with patient-he is aware and will update [place] if he gets any additional information from [facility] [place]. Nothing more needed at this time.

## 2016-10-18 NOTE — Telephone Encounter (Signed)
[name] please advise. Thanks. 

## 2016-10-18 NOTE — Telephone Encounter (Signed)
According to Kathlee Nations it appears profee is working with his Insurance to get correct information to resend claims. We can not state if and when the patient will get a bill as this all depends on his [employer].    LMTCB

## 2016-10-18 NOTE — Telephone Encounter (Signed)
Please see more current phone note.

## 2016-10-18 NOTE — Telephone Encounter (Signed)
Please most current phone note

## 2016-10-22 ENCOUNTER — Other Ambulatory Visit: Payer: Self-pay | Admitting: Internal Medicine

## 2016-10-24 NOTE — Telephone Encounter (Signed)
Approved: #[id number] x 0 

## 2016-10-24 NOTE — Telephone Encounter (Addendum)
Last filled 08-09-16. Last OV 08-08-16 Next OV Tomorrow (10-25-16)

## 2016-10-24 NOTE — Telephone Encounter (Signed)
Left refill on voice mail at pharmacy  

## 2016-10-25 ENCOUNTER — Ambulatory Visit (INDEPENDENT_AMBULATORY_CARE_PROVIDER_SITE_OTHER): Payer: Medicare PPO | Admitting: Internal Medicine

## 2016-10-25 ENCOUNTER — Encounter: Payer: Self-pay | Admitting: Internal Medicine

## 2016-10-25 VITALS — BP 126/80 | HR 63 | Temp 97.4°F | Wt 231.0 lb

## 2016-10-25 DIAGNOSIS — M069 Rheumatoid arthritis, unspecified: Secondary | ICD-10-CM | POA: Diagnosis not present

## 2016-10-25 DIAGNOSIS — K5909 Other constipation: Secondary | ICD-10-CM | POA: Diagnosis not present

## 2016-10-25 DIAGNOSIS — Z960 Presence of urogenital implants: Secondary | ICD-10-CM | POA: Diagnosis not present

## 2016-10-25 DIAGNOSIS — I10 Essential (primary) hypertension: Secondary | ICD-10-CM

## 2016-10-25 NOTE — Progress Notes (Signed)
Pre visit review using our clinic review tool, if applicable. No additional management support is needed unless otherwise documented below in the visit note. 

## 2016-10-25 NOTE — Progress Notes (Signed)
Subjective:    Patient ID: [name], male    DOB: Nov 03, 1934, 82 y.o.   MRN: [id number]  HPI Here with wife for follow up of chronic health conditions  Recently had removal of abnormal area of bladder abnormality Was just chronic inflammation--- undoubtedly from the catheter Chronic Foley still and persistent bladder pain vesicare helps (but is constipating)  Colonoscopy was also done This was very reassuring!  Asthma remains quiet on the xolair  Has recovered reasonably well from the TKR Dr [name] satisfied with this  Still with back pain Still on MTX and plaquenil for the RA  Current Outpatient Prescriptions on File Prior to Visit  Medication Sig Dispense Refill  . ALPRAZolam (XANAX) 0.5 MG tablet TAKE 1 TABLET BY MOUTH THREE TIMES DAILY AS NEEDED FOR ANXIETY 90 tablet 0  . aspirin 81 MG tablet Take 81 mg by mouth daily.    . Cholecalciferol (VITAMIN D3) 5000 UNITS CAPS Take 1 capsule by mouth daily.    . Coenzyme Q10 (CO Q 10) 100 MG CAPS Take 1 capsule by mouth daily.    . Cranberry 425 MG CAPS Take 2 capsules by mouth daily.    . fluticasone (FLONASE) 50 MCG/ACT nasal spray Place 2 sprays into both nostrils 2 (two) times daily.    Marland Kitchen FOLIC ACID PO Take [id number] mcg by mouth daily.     . furosemide (LASIX) 20 MG tablet TAKE 1 TABLET BY MOUTH EVERY DAY 30 tablet 11  . gabapentin (NEURONTIN) 300 MG capsule TAKE 1 CAPSULE BY MOUTH THREE TIMES DAILY 90 capsule 11  . hydroxychloroquine (PLAQUENIL) 200 MG tablet Take 1 tablet by mouth 2 (two) times daily.    . methotrexate (RHEUMATREX) 2.5 MG tablet Take 10 tablets by mouth once a week.    . metoprolol succinate (TOPROL-XL) 50 MG 24 hr tablet TAKE 1 TABLET BY MOUTH EVERY DAY 90 tablet 3  . Milk Thistle 250 MG CAPS Take 1 capsule by mouth daily.    . Multiple Vitamin (MULTIVITAMIN) tablet Take 1 tablet by mouth daily.      Marland Kitchen omeprazole (PRILOSEC) 20 MG capsule TAKE 1 CAPSULE BY MOUTH EVERY DAY 90 capsule 3  . polyethylene  glycol (GLYCOLAX) packet Take 34 g by mouth daily.     . pravastatin (PRAVACHOL) 20 MG tablet TAKE 1 TABLET BY MOUTH EVERY DAY 90 tablet 1  . Probiotic Product (ALIGN) 4 MG CAPS Take 1 capsule by mouth daily.     . Selenium 200 MCG CAPS Take 1 capsule by mouth daily.    . solifenacin (VESICARE) 5 MG tablet Take 5 mg by mouth at bedtime. For bladder spasms    . traMADol (ULTRAM) 50 MG tablet TAKE 1 TABLET BY MOUTH FOUR TIMES DAILY AS NEEDED 120 tablet 0  . Turmeric 500 MG CAPS Take 500 mg by mouth daily.    . vitamin B-12 (CYANOCOBALAMIN) 500 MCG tablet Take 500 mcg by mouth daily.     Current Facility-Administered Medications on File Prior to Visit  Medication Dose Route Frequency Provider Last Rate Last Dose  . 0.9 %  sodium chloride infusion  500 mL Intravenous Continuous Ladene Artist, MD      . omalizumab Arvid Right) injection 375 mg  375 mg Subcutaneous Q14 Days Chesley Mires, MD   375 mg at 09/28/16 1203  . omalizumab Arvid Right) injection 375 mg  375 mg Subcutaneous Q14 Days Chesley Mires, MD   375 mg at 10/13/16 1151    Allergies  Allergen Reactions  . Ciprofloxacin Nausea And Vomiting    Headache  . Citalopram Hydrobromide Other (See Comments)    "unknown"  . Lorazepam     Adverse reaction  . Paroxetine Nausea Only  . Ramipril Other (See Comments)    "unknown"  . Simvastatin     Past Medical History:  Diagnosis Date  . Allergy   . Anemia   . Anxiety   . Arthritis    rheumatoid  . Asthma   . CAD (coronary artery disease)   . Cancer [facility])    Prostate  . Chronic sinusitis   . Depression   . Diverticulitis   . ED (erectile dysfunction)   . GERD (gastroesophageal reflux disease)   . History of SIADH   . Hx of adenomatous colonic polyps   . Hypertension   . Hyponatremia   . IBS (irritable bowel syndrome)   . Neurodermatitis   . Neurogenic bladder   . OSA (obstructive sleep apnea)     Past Surgical History:  Procedure Laterality Date  . KNEE ARTHROPLASTY Right  09/02/2015   Procedure: COMPUTER ASSISTED TOTAL KNEE ARTHROPLASTY;  Surgeon: [name], MD;  Location: [facility] ORS;  Service: Orthopedics;  Laterality: Right;  . KNEE SURGERY Left    ARTHROSCOPY LEFT  . NASAL SINUS SURGERY  2009   DEVIATED SEPTUM AND POLYPS  . PROSTATE SURGERY     PROSTATECTOMY  . TOTAL KNEE ARTHROPLASTY Left 9/15   Dr [name]  . URETHRAL STRICTURE DILATATION  02-2010   Dr.Cope    Family History  Problem Relation Age of Onset  . Heart disease Mother 22    heart failure  . Pneumonia Father 13    pnemonia  . Colon cancer Neg Hx     Social History   Social History  . Marital status: Married    Spouse name: N/A  . Number of children: 2  . Years of education: N/A   Occupational History  . Radio station owner     retired   Social History Main Topics  . Smoking status: Never Smoker  . Smokeless tobacco: Never Used  . Alcohol use 6.0 oz/week    10 Standard drinks or equivalent per week  . Drug use: No  . Sexual activity: Not on file   Other Topics Concern  . Not on file   Social History Narrative   Not sure about a living will or health care POA   Wife should make health care decisions for him   Would accept resuscitation attempts   Not sure about tube feeds   Review of Systems Family stress--- DIL left son after 31 years of marriage. Appetite is good Sleeping better with melatonin gummies (he doesn't know dose)    Objective:   Physical Exam  Constitutional: He appears well-developed. No distress.  Neck: Normal range of motion. No thyromegaly present.  Cardiovascular: Normal rate and normal heart sounds.  Exam reveals no gallop.   No murmur heard. Pulmonary/Chest: Effort normal and breath sounds normal. No respiratory distress. He has no wheezes. He has no rales.  Musculoskeletal: He exhibits no edema.  Lymphadenopathy:    He has no cervical adenopathy.  Psychiatric: He has a normal mood and affect. His behavior is normal.            Assessment & Plan:

## 2016-10-25 NOTE — Assessment & Plan Note (Signed)
Reassured with the colonoscopy

## 2016-10-25 NOTE — Assessment & Plan Note (Signed)
BP Readings from Last 3 Encounters:  10/25/16 126/80  08/16/16 (!) 152/65  08/08/16 102/60   BP fine today No changes needed

## 2016-10-25 NOTE — Assessment & Plan Note (Signed)
Seems to be in remission on the medication Has follow up soon with Dr Jefm Bryant

## 2016-10-25 NOTE — Assessment & Plan Note (Signed)
Inflammation in bladder found with surgery Doing okay with this now

## 2016-10-27 DIAGNOSIS — Z8546 Personal history of malignant neoplasm of prostate: Secondary | ICD-10-CM | POA: Diagnosis not present

## 2016-10-27 DIAGNOSIS — N3040 Irradiation cystitis without hematuria: Secondary | ICD-10-CM | POA: Diagnosis not present

## 2016-10-27 DIAGNOSIS — N3281 Overactive bladder: Secondary | ICD-10-CM | POA: Diagnosis not present

## 2016-10-27 DIAGNOSIS — N319 Neuromuscular dysfunction of bladder, unspecified: Secondary | ICD-10-CM | POA: Diagnosis not present

## 2016-10-27 DIAGNOSIS — D414 Neoplasm of uncertain behavior of bladder: Secondary | ICD-10-CM | POA: Diagnosis not present

## 2016-10-31 ENCOUNTER — Telehealth: Payer: Self-pay | Admitting: Internal Medicine

## 2016-10-31 ENCOUNTER — Ambulatory Visit (INDEPENDENT_AMBULATORY_CARE_PROVIDER_SITE_OTHER): Payer: Medicare PPO

## 2016-10-31 DIAGNOSIS — J4520 Mild intermittent asthma, uncomplicated: Secondary | ICD-10-CM | POA: Diagnosis not present

## 2016-10-31 MED ORDER — OMALIZUMAB 150 MG ~~LOC~~ SOLR
375.0000 mg | SUBCUTANEOUS | Status: DC
  Administered 2016-10-31: 375 mg via SUBCUTANEOUS

## 2016-10-31 NOTE — Telephone Encounter (Signed)
Spoke with Joellen Jersey, states that she will have time tomorrow afternoon to discuss this with pt. lmtcb X1 with pt's wife to call back and make aware of this.

## 2016-10-31 NOTE — Progress Notes (Signed)
[name] came in on 10/31/2016 to receive a Xolair injection. The patient is given 375mg  every 14 days. Due to each vial equalling 150mg , 75mg  of medication was wasted.

## 2016-10-31 NOTE — Telephone Encounter (Signed)
Joellen Jersey can you give a specific time pt can come? Thanks.

## 2016-10-31 NOTE — Telephone Encounter (Signed)
Patient would like to return to clinic tomorrow to have a face to face talk about this letter, would like to receive a phone call to have a specific time to return to clinic.[name]

## 2016-11-01 NOTE — Telephone Encounter (Signed)
Please let patient know that I have been in meetings and my apologies. I can meet with him face to face at 3:00pm tomorrow Wednesday 11/02/16. Thanks.

## 2016-11-01 NOTE — Telephone Encounter (Signed)
Patient called back in regards to this message. Me

## 2016-11-01 NOTE — Telephone Encounter (Signed)
[name] spoke with pt. And he stated he will see you tomorrow at 3pm

## 2016-11-02 NOTE — Telephone Encounter (Signed)
I have met with patient and issues with Xolair have been resolved. Nothing more needed at this time.

## 2016-11-09 ENCOUNTER — Telehealth: Payer: Self-pay | Admitting: Pulmonary Disease

## 2016-11-09 DIAGNOSIS — N319 Neuromuscular dysfunction of bladder, unspecified: Secondary | ICD-10-CM | POA: Diagnosis not present

## 2016-11-09 NOTE — Telephone Encounter (Signed)
#   vials:6 Ordered date:11/09/16  Shipping Date:11/10/16

## 2016-11-10 NOTE — Telephone Encounter (Addendum)
#   Vials:6 Arrival Date: 11/10/16 Lot [id number]  Exp Date:6/21

## 2016-11-11 ENCOUNTER — Ambulatory Visit (INDEPENDENT_AMBULATORY_CARE_PROVIDER_SITE_OTHER): Payer: Medicare PPO | Admitting: Family Medicine

## 2016-11-11 ENCOUNTER — Encounter: Payer: Self-pay | Admitting: Family Medicine

## 2016-11-11 VITALS — BP 94/54 | HR 60 | Temp 97.9°F | Resp 16 | Wt 234.0 lb

## 2016-11-11 DIAGNOSIS — J189 Pneumonia, unspecified organism: Secondary | ICD-10-CM

## 2016-11-11 DIAGNOSIS — J181 Lobar pneumonia, unspecified organism: Secondary | ICD-10-CM

## 2016-11-11 MED ORDER — CEFDINIR 300 MG PO CAPS: 300.0000 mg | ORAL_CAPSULE | Freq: Two times a day (BID) | ORAL | 0 refills | Status: DC

## 2016-11-11 NOTE — Progress Notes (Signed)
Pre visit review using our clinic review tool, if applicable. No additional management support is needed unless otherwise documented below in the visit note. 

## 2016-11-11 NOTE — Patient Instructions (Signed)
Get otc generic robitussin DM OR Mucinex DM and use as directed on the packaging for cough and congestion.  

## 2016-11-11 NOTE — Progress Notes (Signed)
OFFICE VISIT  11/11/2016   CC:  Chief Complaint  Patient presents with  . Cough    with chest congestion x 2-3 day   HPI:    Patient is a 82 y.o.  male who presents for respiratory symptoms.  Has RA and is on methotrexate and plaquenil. Also with hx of asthma and takes xolair injections.  States hx of very good asthma control since getting on xolair. Onset 2-3 d/a-- cough, chest feels congested, has some hoarseness.  +ST.  No nasal congestion or runny nose. No fever.  No SOB.  Some tightness in chest.  No wheezing heard.  Cough not particularly productive.  No CP.  He informs me that he has a suprapubic catheter and his urologist placed him on daily macrobid about 1 mo ago.  Past Medical History:  Diagnosis Date  . Allergy   . Anemia   . Anxiety   . Arthritis    rheumatoid  . Asthma   . CAD (coronary artery disease)   . Cancer [facility])    Prostate  . Chronic sinusitis   . Depression   . Diverticulitis   . ED (erectile dysfunction)   . GERD (gastroesophageal reflux disease)   . History of SIADH   . Hx of adenomatous colonic polyps   . Hypertension   . Hyponatremia   . IBS (irritable bowel syndrome)   . Neurodermatitis   . Neurogenic bladder   . OSA (obstructive sleep apnea)     Past Surgical History:  Procedure Laterality Date  . KNEE ARTHROPLASTY Right 09/02/2015   Procedure: COMPUTER ASSISTED TOTAL KNEE ARTHROPLASTY;  Surgeon: [name], MD;  Location: [facility] ORS;  Service: Orthopedics;  Laterality: Right;  . KNEE SURGERY Left    ARTHROSCOPY LEFT  . NASAL SINUS SURGERY  2009   DEVIATED SEPTUM AND POLYPS  . PROSTATE SURGERY     PROSTATECTOMY  . TOTAL KNEE ARTHROPLASTY Left 9/15   Dr [name]  . URETHRAL STRICTURE DILATATION  02-2010   Dr.Cope    Outpatient Medications Prior to Visit  Medication Sig Dispense Refill  . ALPRAZolam (XANAX) 0.5 MG tablet TAKE 1 TABLET BY MOUTH THREE TIMES DAILY AS NEEDED FOR ANXIETY 90 tablet 0  . aspirin 81 MG tablet Take 81  mg by mouth daily.    . Cholecalciferol (VITAMIN D3) 5000 UNITS CAPS Take 1 capsule by mouth daily.    . Coenzyme Q10 (CO Q 10) 100 MG CAPS Take 1 capsule by mouth daily.    . Cranberry 425 MG CAPS Take 2 capsules by mouth daily.    . fluticasone (FLONASE) 50 MCG/ACT nasal spray Place 2 sprays into both nostrils 2 (two) times daily.    Marland Kitchen FOLIC ACID PO Take XX123456 mcg by mouth daily.     . furosemide (LASIX) 20 MG tablet TAKE 1 TABLET BY MOUTH EVERY DAY 30 tablet 11  . gabapentin (NEURONTIN) 300 MG capsule TAKE 1 CAPSULE BY MOUTH THREE TIMES DAILY 90 capsule 11  . hydroxychloroquine (PLAQUENIL) 200 MG tablet Take 1 tablet by mouth 2 (two) times daily.    . methotrexate (RHEUMATREX) 2.5 MG tablet Take 10 tablets by mouth once a week.    . metoprolol succinate (TOPROL-XL) 50 MG 24 hr tablet TAKE 1 TABLET BY MOUTH EVERY DAY 90 tablet 3  . Milk Thistle 250 MG CAPS Take 1 capsule by mouth daily.    . Multiple Vitamin (MULTIVITAMIN) tablet Take 1 tablet by mouth daily.      Marland Kitchen  omeprazole (PRILOSEC) 20 MG capsule TAKE 1 CAPSULE BY MOUTH EVERY DAY 90 capsule 3  . polyethylene glycol (GLYCOLAX) packet Take 34 g by mouth daily.     . pravastatin (PRAVACHOL) 20 MG tablet TAKE 1 TABLET BY MOUTH EVERY DAY 90 tablet 1  . Probiotic Product (ALIGN) 4 MG CAPS Take 1 capsule by mouth daily.     . Selenium 200 MCG CAPS Take 1 capsule by mouth daily.    . solifenacin (VESICARE) 5 MG tablet Take 5 mg by mouth at bedtime. For bladder spasms    . traMADol (ULTRAM) 50 MG tablet TAKE 1 TABLET BY MOUTH FOUR TIMES DAILY AS NEEDED 120 tablet 0  . Turmeric 500 MG CAPS Take 500 mg by mouth daily.    . vitamin B-12 (CYANOCOBALAMIN) 500 MCG tablet Take 500 mcg by mouth daily.     Facility-Administered Medications Prior to Visit  Medication Dose Route Frequency Provider Last Rate Last Dose  . 0.9 %  sodium chloride infusion  500 mL Intravenous Continuous Ladene Artist, MD      . omalizumab Arvid Right) injection 375 mg  375 mg  Subcutaneous Q14 Days Chesley Mires, MD   375 mg at 09/28/16 1203  . omalizumab Arvid Right) injection 375 mg  375 mg Subcutaneous Q14 Days Chesley Mires, MD   375 mg at 10/13/16 1151  . omalizumab Arvid Right) injection 375 mg  375 mg Subcutaneous Q14 Days Chesley Mires, MD   375 mg at 10/31/16 1538    Allergies  Allergen Reactions  . Ciprofloxacin Nausea And Vomiting    Headache  . Citalopram Hydrobromide Other (See Comments)    "unknown"  . Lorazepam     Adverse reaction  . Paroxetine Nausea Only  . Ramipril Other (See Comments)    "unknown"  . Simvastatin     ROS As per HPI  PE: Blood pressure (!) 94/54, pulse 60, temperature 97.9 F (36.6 C), temperature source Oral, resp. rate 16, weight 234 lb (106.1 kg), SpO2 97 %. Gen: Alert, well appearing.  Patient is oriented to person, place, time, and situation. AFFECT: pleasant, lucid thought and speech. HEENT: eyes without injection, drainage, or swelling.  Ears: EACs clear, TMs with normal light reflex and landmarks.  Nose: No rhinorrhea, but with some dried, crusty exudate adherent to mildly injected mucosa.  No purulent d/c.  No paranasal sinus TTP.  No facial swelling.  Throat and mouth without focal lesion.  No pharyngial swelling, erythema, or exudate.   Neck: supple, no LAD.   LUNGS: CTA bilat but diminished BS in L base, +increased tactile fremitus.  No egophony.  Nonlabored resps.   CV: RRR, 2/6 systolic murmur.  No diastolic murmur.  No r/g. EXT: no c/c/e SKIN: no rash  LABS:    Chemistry      Component Value Date/Time   NA 135 05/18/2016 1344   NA 138 09/28/2014 1610   K 4.1 05/18/2016 1344   K 4.6 09/28/2014 1610   CL 99 05/18/2016 1344   CL 101 09/28/2014 1610   CO2 32 05/18/2016 1344   CO2 30 09/28/2014 1610   BUN 15 05/18/2016 1344   BUN 16 09/28/2014 1610   CREATININE 0.61 05/18/2016 1344   CREATININE 0.62 09/28/2014 1610      Component Value Date/Time   CALCIUM 9.2 05/18/2016 1344   CALCIUM 8.2 (L) 09/28/2014  1610   ALKPHOS 62 05/18/2016 1344   ALKPHOS 49 09/28/2014 1610   AST 18 05/18/2016 1344   AST  36 09/28/2014 1610   ALT 15 05/18/2016 1344   ALT 26 09/28/2014 1610   BILITOT 0.4 05/18/2016 1344   BILITOT 0.5 09/28/2014 1610     IMPRESSION AND PLAN:  1) CAP, mildly immunosuppressed pt (methotrexate and plaquenil). Cefdinir 300 mg bid x 10d. Mucinex DM q12h prn. I don't detect any sign of acute asthma exacerbation.  An After Visit Summary was printed and given to the patient.  FOLLOW UP: Return for f/u with Dr. Silvio Pate in 7-10 days.  Signed:  Crissie Sickles, MD           11/11/2016

## 2016-11-14 ENCOUNTER — Ambulatory Visit (INDEPENDENT_AMBULATORY_CARE_PROVIDER_SITE_OTHER): Payer: Medicare PPO | Admitting: Internal Medicine

## 2016-11-14 ENCOUNTER — Ambulatory Visit (INDEPENDENT_AMBULATORY_CARE_PROVIDER_SITE_OTHER)
Admission: RE | Admit: 2016-11-14 | Discharge: 2016-11-14 | Disposition: A | Discharge status: Home / Self Care | Payer: Medicare PPO | Source: Ambulatory Visit | Attending: Internal Medicine | Admitting: Internal Medicine

## 2016-11-14 ENCOUNTER — Encounter: Payer: Self-pay | Admitting: Internal Medicine

## 2016-11-14 ENCOUNTER — Telehealth: Payer: Self-pay | Admitting: Internal Medicine

## 2016-11-14 VITALS — BP 122/62 | HR 67 | Temp 98.4°F | Resp 12 | Wt 232.0 lb

## 2016-11-14 DIAGNOSIS — J181 Lobar pneumonia, unspecified organism: Secondary | ICD-10-CM

## 2016-11-14 DIAGNOSIS — J4531 Mild persistent asthma with (acute) exacerbation: Secondary | ICD-10-CM

## 2016-11-14 DIAGNOSIS — J45901 Unspecified asthma with (acute) exacerbation: Secondary | ICD-10-CM | POA: Insufficient documentation

## 2016-11-14 MED ORDER — ALBUTEROL SULFATE HFA 108 (90 BASE) MCG/ACT IN AERS: 2.0000 | INHALATION_SPRAY | Freq: Four times a day (QID) | RESPIRATORY_TRACT | 3 refills | Status: DC | PRN

## 2016-11-14 MED ORDER — PREDNISONE 20 MG PO TABS: 40.0000 mg | ORAL_TABLET | Freq: Every day | ORAL | 1 refills | Status: DC

## 2016-11-14 MED ORDER — LEVOFLOXACIN 500 MG PO TABS: 500.0000 mg | ORAL_TABLET | Freq: Every day | ORAL | 0 refills | Status: DC

## 2016-11-14 NOTE — Telephone Encounter (Signed)
Suggest wait 2 weeks

## 2016-11-14 NOTE — Telephone Encounter (Signed)
Spoke with pt. He is aware of CY's recommendation. Nothing further was needed. 

## 2016-11-14 NOTE — Progress Notes (Signed)
Subjective:    Patient ID: [name], male    DOB: 12-11-1933, 82 y.o.   MRN: [id number]  HPI Here with daughter due to respiratory illness Started 5 days ago with severe cough Seen by Dr Ernestine Conrad 3 days ago Has been on omnicef and mucinex DM bid Doesn't feel any better--maybe even some worse  Noted some SOB last night--first time since 2010 Some wheezing as well No clear improvement with albuterol Some rhinorrhea and congestion  No fever No chills or sweats   Current Outpatient Prescriptions on File Prior to Visit  Medication Sig Dispense Refill  . ALPRAZolam (XANAX) 0.5 MG tablet TAKE 1 TABLET BY MOUTH THREE TIMES DAILY AS NEEDED FOR ANXIETY 90 tablet 0  . aspirin 81 MG tablet Take 81 mg by mouth daily.    . cefdinir (OMNICEF) 300 MG capsule Take 1 capsule (300 mg total) by mouth 2 (two) times daily. 20 capsule 0  . Cholecalciferol (VITAMIN D3) 5000 UNITS CAPS Take 1 capsule by mouth daily.    . Coenzyme Q10 (CO Q 10) 100 MG CAPS Take 1 capsule by mouth daily.    . Cranberry 425 MG CAPS Take 2 capsules by mouth daily.    . fluticasone (FLONASE) 50 MCG/ACT nasal spray Place 2 sprays into both nostrils 2 (two) times daily.    Marland Kitchen FOLIC ACID PO Take XX123456 mcg by mouth daily.     . furosemide (LASIX) 20 MG tablet TAKE 1 TABLET BY MOUTH EVERY DAY 30 tablet 11  . gabapentin (NEURONTIN) 300 MG capsule TAKE 1 CAPSULE BY MOUTH THREE TIMES DAILY 90 capsule 11  . hydroxychloroquine (PLAQUENIL) 200 MG tablet Take 1 tablet by mouth 2 (two) times daily.    . methotrexate (RHEUMATREX) 2.5 MG tablet Take 10 tablets by mouth once a week.    . metoprolol succinate (TOPROL-XL) 50 MG 24 hr tablet TAKE 1 TABLET BY MOUTH EVERY DAY 90 tablet 3  . Milk Thistle 250 MG CAPS Take 1 capsule by mouth daily.    . Multiple Vitamin (MULTIVITAMIN) tablet Take 1 tablet by mouth daily.      Marland Kitchen omeprazole (PRILOSEC) 20 MG capsule TAKE 1 CAPSULE BY MOUTH EVERY DAY 90 capsule 3  . polyethylene glycol (GLYCOLAX)  packet Take 34 g by mouth daily.     . pravastatin (PRAVACHOL) 20 MG tablet TAKE 1 TABLET BY MOUTH EVERY DAY 90 tablet 1  . Probiotic Product (ALIGN) 4 MG CAPS Take 1 capsule by mouth daily.     . Selenium 200 MCG CAPS Take 1 capsule by mouth daily.    . solifenacin (VESICARE) 5 MG tablet Take 5 mg by mouth at bedtime. For bladder spasms    . traMADol (ULTRAM) 50 MG tablet TAKE 1 TABLET BY MOUTH FOUR TIMES DAILY AS NEEDED 120 tablet 0  . Turmeric 500 MG CAPS Take 500 mg by mouth daily.    . vitamin B-12 (CYANOCOBALAMIN) 500 MCG tablet Take 500 mcg by mouth daily.     Current Facility-Administered Medications on File Prior to Visit  Medication Dose Route Frequency Provider Last Rate Last Dose  . 0.9 %  sodium chloride infusion  500 mL Intravenous Continuous Ladene Artist, MD      . omalizumab Arvid Right) injection 375 mg  375 mg Subcutaneous Q14 Days Chesley Mires, MD   375 mg at 09/28/16 1203  . omalizumab Arvid Right) injection 375 mg  375 mg Subcutaneous Q14 Days Chesley Mires, MD   375 mg at  10/13/16 1151  . omalizumab Arvid Right) injection 375 mg  375 mg Subcutaneous Q14 Days Chesley Mires, MD   375 mg at 10/31/16 1538    Allergies  Allergen Reactions  . Ciprofloxacin Nausea And Vomiting    Headache  . Citalopram Hydrobromide Other (See Comments)    "unknown"  . Lorazepam     Adverse reaction  . Paroxetine Nausea Only  . Ramipril Other (See Comments)    "unknown"  . Simvastatin     Past Medical History:  Diagnosis Date  . Allergy   . Anemia   . Anxiety   . Arthritis    rheumatoid  . Asthma   . CAD (coronary artery disease)   . Cancer [facility])    Prostate  . Chronic sinusitis   . Depression   . Diverticulitis   . ED (erectile dysfunction)   . GERD (gastroesophageal reflux disease)   . History of SIADH   . Hx of adenomatous colonic polyps   . Hypertension   . Hyponatremia   . IBS (irritable bowel syndrome)   . Neurodermatitis   . Neurogenic bladder   . OSA (obstructive sleep  apnea)     Past Surgical History:  Procedure Laterality Date  . KNEE ARTHROPLASTY Right 09/02/2015   Procedure: COMPUTER ASSISTED TOTAL KNEE ARTHROPLASTY;  Surgeon: [name], MD;  Location: [facility] ORS;  Service: Orthopedics;  Laterality: Right;  . KNEE SURGERY Left    ARTHROSCOPY LEFT  . NASAL SINUS SURGERY  2009   DEVIATED SEPTUM AND POLYPS  . PROSTATE SURGERY     PROSTATECTOMY  . TOTAL KNEE ARTHROPLASTY Left 9/15   Dr [name]  . URETHRAL STRICTURE DILATATION  02-2010   Dr.Cope    Family History  Problem Relation Age of Onset  . Heart disease Mother 82    heart failure  . Pneumonia Father 61    pnemonia  . Colon cancer Neg Hx     Social History   Social History  . Marital status: Married    Spouse name: N/A  . Number of children: 2  . Years of education: N/A   Occupational History  . Radio station owner     retired   Social History Main Topics  . Smoking status: Never Smoker  . Smokeless tobacco: Never Used  . Alcohol use 6.0 oz/week    10 Standard drinks or equivalent per week  . Drug use: No  . Sexual activity: Not on file   Other Topics Concern  . Not on file   Social History Narrative   Not sure about a living will or health care POA   Wife should make health care decisions for him   Would accept resuscitation attempts   Not sure about tube feeds   Review of Systems Has a headache--?from the coughing Had some chest pain due to the severity of the cough No N/V Appetite is still okay--but did seem to have more SOB after drinking soup last night    Objective:   Physical Exam  Constitutional: No distress.  HENT:  Mouth/Throat: Oropharynx is clear and moist. No oropharyngeal exudate.  ?slight maxillary tenderness  Cardiovascular: Normal rate, regular rhythm and normal heart sounds.  Exam reveals no gallop.   No murmur heard. Pulmonary/Chest: No respiratory distress. He has no rales.  Fair air movement Mild increased expiratory phase and slight  wheeze          Assessment & Plan:

## 2016-11-14 NOTE — Assessment & Plan Note (Signed)
LLL pneumonia on the x-ray Has tolerated levaquin in the past--will give him this Will set up follow up in 1 month--sooner if not improving Can stop the [facility]

## 2016-11-14 NOTE — Patient Instructions (Addendum)
Please stop the cefdinir and mucinex. Let me know if you get any worse.

## 2016-11-14 NOTE — Telephone Encounter (Signed)
Spoke with pt, states he has been treated for PNA through his PCP office.  He cancelled xolair injection for today, would like to know when it would be safe to resume xolair.  Pt was seen today by PCP, OV notes available in Epic.  CY please advise.  Thanks!

## 2016-11-14 NOTE — Progress Notes (Signed)
Pre visit review using our clinic review tool, if applicable. No additional management support is needed unless otherwise documented below in the visit note. 

## 2016-11-14 NOTE — Assessment & Plan Note (Addendum)
Has generally been controlled with the xolair Will give a few days of prednisone

## 2016-11-15 ENCOUNTER — Ambulatory Visit: Payer: Medicare PPO

## 2016-11-16 ENCOUNTER — Telehealth: Payer: Self-pay | Admitting: Internal Medicine

## 2016-11-16 ENCOUNTER — Telehealth: Payer: Self-pay | Admitting: Pulmonary Disease

## 2016-11-16 NOTE — Telephone Encounter (Signed)
Patient was told to call back if he got worse.  Patient is having trouble breathing.  Patient using the Ventelin every 4-6 hours.  Patient has a very sore throat.  Patient is taking his antibiotic and prednisone, but he feel terrible and doesn't think he's getting any better. Patient is very congested and he can't get the fluid out of his lungs.  Patient is coughing. Patient has a temperature of 99'.

## 2016-11-16 NOTE — Telephone Encounter (Signed)
Spoke to pt. He will follow-up with Dr Halford Chessman

## 2016-11-16 NOTE — Telephone Encounter (Signed)
Please call him I think he should see his pulmonary doctor I will send to Dr Halford Chessman also

## 2016-11-17 ENCOUNTER — Other Ambulatory Visit: Payer: Self-pay | Admitting: Internal Medicine

## 2016-11-17 NOTE — Telephone Encounter (Signed)
Error

## 2016-11-18 ENCOUNTER — Encounter: Payer: Self-pay | Admitting: Adult Health

## 2016-11-18 ENCOUNTER — Ambulatory Visit (INDEPENDENT_AMBULATORY_CARE_PROVIDER_SITE_OTHER): Payer: Medicare PPO | Admitting: Adult Health

## 2016-11-18 DIAGNOSIS — J181 Lobar pneumonia, unspecified organism: Secondary | ICD-10-CM

## 2016-11-18 DIAGNOSIS — J4541 Moderate persistent asthma with (acute) exacerbation: Secondary | ICD-10-CM | POA: Diagnosis not present

## 2016-11-18 NOTE — Progress Notes (Signed)
@Patient  ID: [name], male    DOB: [birth year], 82 y.o.   MRN: [id number]  Chief Complaint  Patient presents with  . Follow-up    PNA     Referring provider: [name], MD  HPI: 82 yo male never smoker followed for moderate persistent Allergic Asthma on Xolair   RA on MTX /Plaquenil   Neurogenic bladder w/ chronic foley   TEST  PFT 04/07/10 >> FEV1 2.61 (86%), FEV1% 73, TLC 7.26 (85%), TLC 112%  11/18/2016 Follow up : PNA  Pt presents for follow up for recently diagnosed PNA. Pt complains of 2 weeks of cough , congestion , drainage and wheezing . Initial low grade fever. He was seen by PCP office and started on Omincef and Prednisone taper on 11/11/16 . He returned to their office on 12/18 for persistent sx . Found to have LLL PNA, started on 10d Levaquin . He is starting to feel better with less cough and wheezing .  He is eating well w/ no n/v/d. No chest pain or hemopytiss or increased edema.        Allergies  Allergen Reactions  . Ciprofloxacin Nausea And Vomiting    Headache  . Citalopram Hydrobromide Other (See Comments)    "unknown"  . Lorazepam     Adverse reaction  . Paroxetine Nausea Only  . Ramipril Other (See Comments)    "unknown"  . Simvastatin     Immunization History  Administered Date(s) Administered  . Influenza Split 09/26/2011  . Influenza Whole 09/07/2007, 08/21/2008, 09/01/2009, 08/23/2010  . Influenza,inj,Quad PF,36+ Mos 09/04/2013, 07/28/2014, 08/20/2015, 08/08/2016  . Influenza-Unspecified 09/06/2012  . Pneumococcal Conjugate-13 07/28/2014  . Pneumococcal Polysaccharide-23 04/09/2012  . Td 07/13/2009    Past Medical History:  Diagnosis Date  . Allergy   . Anemia   . Anxiety   . Arthritis    rheumatoid  . Asthma   . CAD (coronary artery disease)   . Cancer [facility])    Prostate  . Chronic sinusitis   . Depression   . Diverticulitis   . ED (erectile dysfunction)   . GERD (gastroesophageal reflux disease)   . History  of SIADH   . Hx of adenomatous colonic polyps   . Hypertension   . Hyponatremia   . IBS (irritable bowel syndrome)   . Neurodermatitis   . Neurogenic bladder   . OSA (obstructive sleep apnea)     Tobacco History: History  Smoking Status  . Never Smoker  Smokeless Tobacco  . Never Used   Counseling given: Not Answered   Outpatient Encounter Prescriptions as of 11/18/2016  Medication Sig  . albuterol (PROVENTIL HFA;VENTOLIN HFA) 108 (90 Base) MCG/ACT inhaler Inhale 2 puffs into the lungs every 6 (six) hours as needed for wheezing or shortness of breath.  . ALPRAZolam (XANAX) 0.5 MG tablet TAKE 1 TABLET BY MOUTH THREE TIMES DAILY AS NEEDED FOR ANXIETY  . aspirin 81 MG tablet Take 81 mg by mouth daily.  . Cholecalciferol (VITAMIN D3) 5000 UNITS CAPS Take 1 capsule by mouth daily.  . Coenzyme Q10 (CO Q 10) 100 MG CAPS Take 1 capsule by mouth daily.  . Cranberry 425 MG CAPS Take 2 capsules by mouth daily.  . fluticasone (FLONASE) 50 MCG/ACT nasal spray Place 2 sprays into both nostrils 2 (two) times daily.  Marland Kitchen FOLIC ACID PO Take [id number] mcg by mouth daily.   . furosemide (LASIX) 20 MG tablet TAKE 1 TABLET BY MOUTH EVERY DAY  . gabapentin (  NEURONTIN) 300 MG capsule TAKE 1 CAPSULE BY MOUTH THREE TIMES DAILY  . hydroxychloroquine (PLAQUENIL) 200 MG tablet Take 1 tablet by mouth 2 (two) times daily.  Marland Kitchen levofloxacin (LEVAQUIN) 500 MG tablet Take 1 tablet (500 mg total) by mouth daily.  . methotrexate (RHEUMATREX) 2.5 MG tablet Take 10 tablets by mouth once a week.  . metoprolol succinate (TOPROL-XL) 50 MG 24 hr tablet TAKE 1 TABLET BY MOUTH EVERY DAY  . Milk Thistle 250 MG CAPS Take 1 capsule by mouth daily.  . Multiple Vitamin (MULTIVITAMIN) tablet Take 1 tablet by mouth daily.    Marland Kitchen omeprazole (PRILOSEC) 20 MG capsule TAKE 1 CAPSULE BY MOUTH EVERY DAY  . polyethylene glycol (GLYCOLAX) packet Take 34 g by mouth daily.   . pravastatin (PRAVACHOL) 20 MG tablet TAKE 1 TABLET BY MOUTH EVERY DAY   . pravastatin (PRAVACHOL) 20 MG tablet TAKE 1 TABLET BY MOUTH EVERY DAY  . predniSONE (DELTASONE) 20 MG tablet Take 2 tablets (40 mg total) by mouth daily.  . Probiotic Product (ALIGN) 4 MG CAPS Take 1 capsule by mouth daily.   . Selenium 200 MCG CAPS Take 1 capsule by mouth daily.  . solifenacin (VESICARE) 5 MG tablet Take 5 mg by mouth at bedtime. For bladder spasms  . traMADol (ULTRAM) 50 MG tablet TAKE 1 TABLET BY MOUTH FOUR TIMES DAILY AS NEEDED  . Turmeric 500 MG CAPS Take 500 mg by mouth daily.  . vitamin B-12 (CYANOCOBALAMIN) 500 MCG tablet Take 500 mcg by mouth daily.   Facility-Administered Encounter Medications as of 11/18/2016  Medication  . 0.9 %  sodium chloride infusion  . omalizumab Arvid Right) injection 375 mg  . omalizumab Arvid Right) injection 375 mg  . omalizumab Arvid Right) injection 375 mg     Review of Systems  Constitutional:   No  weight loss, night sweats,  Fevers, chills,  +fatigue, or  lassitude.  HEENT:   No headaches,  Difficulty swallowing,  Tooth/dental problems, or  Sore throat,                No sneezing, itching, ear ache,  '+nasal congestion, post nasal drip,   CV:  No chest pain,  Orthopnea, PND, swelling in lower extremities, anasarca, dizziness, palpitations, syncope.   GI  No heartburn, indigestion, abdominal pain, nausea, vomiting, diarrhea, change in bowel habits, loss of appetite, bloody stools.   Resp:  .  No chest wall deformity  Skin: no rash or lesions.  GU: no dysuria, change in color of urine, no urgency or frequency.  No flank pain, no hematuria   MS:  No joint pain or swelling.  No decreased range of motion.  No back pain.    Physical Exam  BP (!) 152/82 (BP Location: Left Arm, Cuff Size: Normal)   Pulse 62   Ht 5\' 11"  (1.803 m)   Wt 228 lb 9.6 oz (103.7 kg)   SpO2 95%   BMI 31.88 kg/m   GEN: A/Ox3; pleasant , NAD, elderly    HEENT:  [place]/AT,  EACs-clear, TMs-wnl, NOSE-clear drainage , THROAT-clear, no lesions, no  postnasal drip or exudate noted.   NECK:  Supple w/ fair ROM; no JVD; normal carotid impulses w/o bruits; no thyromegaly or nodules palpated; no lymphadenopathy.    RESP  Few rhonchi ,  no accessory muscle use, no dullness to percussion  CARD:  RRR, no m/r/g, 1+ peripheral edema, pulses intact, no cyanosis or clubbing.  GI:   Soft & nt; nml bowel sounds;  no organomegaly or masses detected.   Musco: Warm bil, no deformities or joint swelling noted.   Neuro: alert, no focal deficits noted.    Skin: Warm, no lesions or rashes  Psych:  No change in mood or affect. No depression or anxiety.  No memory loss.  Lab Results:  CBC    Component Value Date/Time   WBC 6.4 05/18/2016 1344   RBC 4.22 05/18/2016 1344   HGB 13.6 05/18/2016 1344   HGB 11.1 (L) 09/28/2014 1610   HCT 40.4 05/18/2016 1344   HCT 33.6 (L) 09/28/2014 1610   PLT 166.0 05/18/2016 1344   PLT 192 09/28/2014 1610   MCV 95.6 05/18/2016 1344   MCV 96 09/28/2014 1610   MCH 30.8 09/10/2015 0945   MCHC 33.6 05/18/2016 1344   RDW 15.0 05/18/2016 1344   RDW 16.9 (H) 09/28/2014 1610   LYMPHSABS 1.2 05/18/2016 1344   LYMPHSABS 1.3 09/09/2014 0652   MONOABS 0.5 05/18/2016 1344   MONOABS 0.4 09/09/2014 0652   EOSABS 0.2 05/18/2016 1344   EOSABS 0.5 09/09/2014 0652   BASOSABS 0.0 05/18/2016 1344   BASOSABS 0.1 09/09/2014 0652    BMET    Component Value Date/Time   NA 135 05/18/2016 1344   NA 138 09/28/2014 1610   K 4.1 05/18/2016 1344   K 4.6 09/28/2014 1610   CL 99 05/18/2016 1344   CL 101 09/28/2014 1610   CO2 32 05/18/2016 1344   CO2 30 09/28/2014 1610   GLUCOSE 86 05/18/2016 1344   GLUCOSE 137 (H) 09/28/2014 1610   GLUCOSE 109 (H) 10/05/2006 1611   BUN 15 05/18/2016 1344   BUN 16 09/28/2014 1610   CREATININE 0.61 05/18/2016 1344   CREATININE 0.62 09/28/2014 1610   CALCIUM 9.2 05/18/2016 1344   CALCIUM 8.2 (L) 09/28/2014 1610   GFRNONAA >60 09/10/2015 0945   GFRNONAA >60 09/28/2014 1610   GFRNONAA >60  08/06/2014 1339   GFRAA >60 09/10/2015 0945   GFRAA >60 09/28/2014 1610   GFRAA >60 08/06/2014 1339    BNP No results found for: BNP  ProBNP No results found for: PROBNP  Imaging: Dg Chest 2 View  Result Date: 11/14/2016 CLINICAL DATA:  82 y/o  M; asthma with acute exacerbation. EXAM: CHEST  2 VIEW COMPARISON:  03/31/2015 chest radiograph. FINDINGS: Stable cardiac silhouette within normal limits. Aortic atherosclerosis with arch calcification. Partially visualized degenerative changes of the shoulder joints. Mild bronchitic markings. Left basilar opacity with elevated left hemidiaphragm probably represents atelectasis. Pneumonia is not excluded. IMPRESSION: Mild bronchitic markings. Left basilar opacity with elevated left hemidiaphragm probably represents atelectasis. Pneumonia is not excluded. Aortic atherosclerosis. Electronically Signed   By: Kristine Garbe M.D.   On: 11/14/2016 14:52     Assessment & Plan:   Allergic asthma Mild flare with PNA , improving with steroids  Hold on Xolair for 2 weeks .   Plan  Patient Instructions  Finish Levaquin as directed.  Finish Prednisone taper .  Mucinex DM Twice daily  As needed  Cough/congestion  Fluids and rest  Follow up in 2-3 weeks with chest xray and As needed   Please contact office for sooner follow up if symptoms do not improve or worsen or seek emergency care       Lobar pneumonia ([place]) LLL PNA -clinically improving  Eating well w/ good tolerance of LEvaquin  He seems to be improivng and will cont OP tx plan with Levaquin.  follow up in 2 weeks w/ cxr  Plan  Patient Instructions  Finish Levaquin as directed.  Finish Prednisone taper .  Mucinex DM Twice daily  As needed  Cough/congestion  Fluids and rest  Follow up in 2-3 weeks with chest xray and As needed   Please contact office for sooner follow up if symptoms do not improve or worsen or seek emergency care          Rexene Edison,  NP 11/18/2016

## 2016-11-18 NOTE — Assessment & Plan Note (Signed)
Mild flare with PNA , improving with steroids  Hold on Xolair for 2 weeks .   Plan  Patient Instructions  Finish Levaquin as directed.  Finish Prednisone taper .  Mucinex DM Twice daily  As needed  Cough/congestion  Fluids and rest  Follow up in 2-3 weeks with chest xray and As needed   Please contact office for sooner follow up if symptoms do not improve or worsen or seek emergency care

## 2016-11-18 NOTE — Patient Instructions (Addendum)
Finish Levaquin as directed.  Finish Prednisone taper .  Mucinex DM Twice daily  As needed  Cough/congestion  Fluids and rest  Follow up in 2-3 weeks with chest xray and As needed   Please contact office for sooner follow up if symptoms do not improve or worsen or seek emergency care

## 2016-11-18 NOTE — Assessment & Plan Note (Signed)
LLL PNA -clinically improving  Eating well w/ good tolerance of LEvaquin  He seems to be improivng and will cont OP tx plan with Levaquin.  follow up in 2 weeks w/ cxr   Plan  Patient Instructions  Finish Levaquin as directed.  Finish Prednisone taper .  Mucinex DM Twice daily  As needed  Cough/congestion  Fluids and rest  Follow up in 2-3 weeks with chest xray and As needed   Please contact office for sooner follow up if symptoms do not improve or worsen or seek emergency care

## 2016-11-19 DIAGNOSIS — R339 Retention of urine, unspecified: Secondary | ICD-10-CM | POA: Diagnosis not present

## 2016-11-19 DIAGNOSIS — N319 Neuromuscular dysfunction of bladder, unspecified: Secondary | ICD-10-CM | POA: Diagnosis not present

## 2016-11-22 ENCOUNTER — Other Ambulatory Visit: Payer: Self-pay | Admitting: Family Medicine

## 2016-11-22 NOTE — Telephone Encounter (Signed)
Routing to PCP, last filled on 10/10/16 #90 with 0 refills

## 2016-11-22 NOTE — Telephone Encounter (Signed)
Approved: #[id number] x 0 

## 2016-11-22 NOTE — Telephone Encounter (Signed)
Left refill on voice mail at pharmacy  

## 2016-11-23 NOTE — Progress Notes (Signed)
Reviewed and agree with assessment/plan.   Taeshawn Helfman, MD [facility] 11/23/2016, 12:24 PM Pager:  [phone]  

## 2016-11-24 ENCOUNTER — Telehealth: Payer: Self-pay

## 2016-11-24 MED ORDER — PREDNISONE 20 MG PO TABS: 40.0000 mg | ORAL_TABLET | Freq: Every day | ORAL | 1 refills | Status: DC

## 2016-11-24 NOTE — Telephone Encounter (Signed)
Okay to send prednisone 20mg  daily for 1 week #7 x 0 It may take some time for the cough to go away completely--he may just need to be patient (as long as he feels better)

## 2016-11-24 NOTE — Telephone Encounter (Signed)
Spoke to pt. Sent in medication

## 2016-11-24 NOTE — Telephone Encounter (Signed)
Pt left v/m; pt saw Dr Silvio Pate on 11/14/16 with pneumonia; pt was referred to pulmonary on 11/18/16 and pulmonary did nothing different than Dr Silvio Pate did. Pt is out of abx and prednisone. Cough is better and then the next day pt starts coughing again. Pt request refill of prednisone to get rid of cough completely. Pt request cb. [name].

## 2016-11-25 ENCOUNTER — Telehealth: Payer: Self-pay | Admitting: Gastroenterology

## 2016-11-25 NOTE — Telephone Encounter (Signed)
Patient with constipation and hemorrhoids from straining.  Patient is advised to try a miralax purge 6-8 glasses tonight and try recticare for hemorrhoids.  Soak in tub of warm water/sitz bath 10 minutes BID, [place] pads, meticulous cleaning between bowel movements. He is advised that he should avoid straining for a BM and to add 2-3 doses of Miralax a day per last office note of Dr. Fuller Plan from 02/2016  He will call back if this fails to help

## 2016-11-29 ENCOUNTER — Telehealth: Payer: Self-pay | Admitting: Internal Medicine

## 2016-11-29 NOTE — Telephone Encounter (Signed)
sure

## 2016-11-29 NOTE — Telephone Encounter (Signed)
Left message asking pt to call office please let pt know about appointment date and time 1/2/rbh

## 2016-11-29 NOTE — Telephone Encounter (Signed)
Pt didn't want appointment has 1/9 pulm

## 2016-11-29 NOTE — Telephone Encounter (Signed)
Spoke with the pt  He states that he is needing update on xolair  He states that he spoke with Joellen Jersey before the Holidays and she was working on this  I advised she is not here today, but will forward her this msg to check for update

## 2016-11-29 NOTE — Telephone Encounter (Signed)
Pt called wanting to get an appointment for Thursday 1/4.  Can I use same day @ 4:30 he stated it was for pneumonia

## 2016-11-30 NOTE — Telephone Encounter (Signed)
[phone] wants to talk to Ashtyn

## 2016-12-01 ENCOUNTER — Encounter: Payer: Self-pay | Admitting: *Deleted

## 2016-12-01 ENCOUNTER — Ambulatory Visit: Payer: Medicare PPO | Admitting: Internal Medicine

## 2016-12-01 ENCOUNTER — Ambulatory Visit: Payer: Medicare PPO

## 2016-12-01 NOTE — Telephone Encounter (Signed)
[phone] pt calling back

## 2016-12-01 NOTE — Telephone Encounter (Signed)
Dr Halford Chessman please advise once letter is signed, this is in your look at. Thanks.

## 2016-12-01 NOTE — Telephone Encounter (Signed)
Spoke with pt. He states that he needs a letter faxed to his insurance to allow him to continue receiving his Xolair injections. Pt states that he can't keep waiting on [name] to handle this. There is a letter in his chart that was written 03/19/15 by VS addressed to his insurance. This letter can/will suffice what is needed for his insurance. Letter has been printed with today's date and placed in VS look at. Message will be routed to Ashtyn for follow up and to have the letter faxed to pt's insurance.

## 2016-12-02 ENCOUNTER — Encounter: Payer: Self-pay | Admitting: Pulmonary Disease

## 2016-12-02 NOTE — Telephone Encounter (Signed)
I have printed letter

## 2016-12-02 NOTE — Telephone Encounter (Signed)
lmtcb for pt. Letter has been signed by VS and is triage on white board.

## 2016-12-05 ENCOUNTER — Other Ambulatory Visit: Payer: Self-pay | Admitting: Adult Health

## 2016-12-05 DIAGNOSIS — J181 Lobar pneumonia, unspecified organism: Secondary | ICD-10-CM

## 2016-12-05 NOTE — Telephone Encounter (Addendum)
Called and spoke to pt. Informed him of the letter. Pt verbalized understanding. Spoke with Sadie Haber., will forward message and hand over letter per her request.   Pt is also requesting to meet with [name] on 1.9.18 after appt with TP at 3:30pm.   Will forward to [facility] to follow.

## 2016-12-06 ENCOUNTER — Ambulatory Visit (INDEPENDENT_AMBULATORY_CARE_PROVIDER_SITE_OTHER): Payer: Medicare PPO | Admitting: Adult Health

## 2016-12-06 ENCOUNTER — Ambulatory Visit (INDEPENDENT_AMBULATORY_CARE_PROVIDER_SITE_OTHER)
Admission: RE | Admit: 2016-12-06 | Discharge: 2016-12-06 | Disposition: A | Discharge status: Home / Self Care | Payer: Medicare PPO | Source: Ambulatory Visit | Attending: Adult Health | Admitting: Adult Health

## 2016-12-06 ENCOUNTER — Encounter: Payer: Self-pay | Admitting: Adult Health

## 2016-12-06 DIAGNOSIS — J4551 Severe persistent asthma with (acute) exacerbation: Secondary | ICD-10-CM | POA: Diagnosis not present

## 2016-12-06 DIAGNOSIS — J189 Pneumonia, unspecified organism: Secondary | ICD-10-CM | POA: Diagnosis not present

## 2016-12-06 DIAGNOSIS — J181 Lobar pneumonia, unspecified organism: Secondary | ICD-10-CM

## 2016-12-06 NOTE — Assessment & Plan Note (Signed)
Recent flare with PNA now resolved He is improving , can likely restart Xolair next week if continue to improve Discussed with CMA [name] that [place] has been approved for 2 yrs with insurance .  Plan  Patient Instructions  Continue on current regimen.  Follow up with Dr. Halford Chessman  In 3-4 weeks with chest xray and As needed   Please contact office for sooner follow up if symptoms do not improve or worsen or seek emergency care

## 2016-12-06 NOTE — Progress Notes (Signed)
HPI:   @Patient  ID: [name], male    DOB: [birth year], 83 y.o.   MRN: [id number]  Chief Complaint  Patient presents with  . Follow-up    Asthma [name]     Referring provider: [name], MD  HPI: 83 yo male never smoker followed for moderate persistent Allergic Asthma on Xolair   RA on MTX /Plaquenil   Neurogenic bladder w/ chronic foley   TEST  PFT 04/07/10 >> FEV1 2.61 (86%), FEV1% 73, TLC 7.26 (85%), TLC 112%  12/06/2016 Follow up :PNA/Asthma  Pt returns for follow up for recently diagnosed LLL PNA. He was seen by on 12/15 given omncief and pred taper. No better went back  PCP 12/18 started on Levaquin for 10 days . CXR showed left basilar opacity . He returns today feeling much better with near resolved cough .  Appetite is good. No n/v.d  He has held his methotrexate. Also no Xolair for few weeks.  CXR today does not show significant change . (reviewed independently )        Allergies  Allergen Reactions  . Ciprofloxacin Nausea And Vomiting    Headache  . Citalopram Hydrobromide Other (See Comments)    "unknown"  . Lorazepam     Adverse reaction  . Paroxetine Nausea Only  . Ramipril Other (See Comments)    "unknown"  . Simvastatin     Immunization History  Administered Date(s) Administered  . Influenza Split 09/26/2011  . Influenza Whole 09/07/2007, 08/21/2008, 09/01/2009, 08/23/2010  . Influenza,inj,Quad PF,36+ Mos 09/04/2013, 07/28/2014, 08/20/2015, 08/08/2016  . Influenza-Unspecified 09/06/2012  . Pneumococcal Conjugate-13 07/28/2014  . Pneumococcal Polysaccharide-23 04/09/2012  . Td 07/13/2009    Past Medical History:  Diagnosis Date  . Allergy   . Anemia   . Anxiety   . Arthritis    rheumatoid  . Asthma   . CAD (coronary artery disease)   . Cancer [facility])    Prostate  . Chronic sinusitis   . Depression   . Diverticulitis   . ED (erectile dysfunction)   . GERD (gastroesophageal reflux disease)   . History of SIADH   . Hx of  adenomatous colonic polyps   . Hypertension   . Hyponatremia   . IBS (irritable bowel syndrome)   . Neurodermatitis   . Neurogenic bladder   . OSA (obstructive sleep apnea)     Tobacco History: History  Smoking Status  . Never Smoker  Smokeless Tobacco  . Never Used   Counseling given: Not Answered   Outpatient Encounter Prescriptions as of 12/06/2016  Medication Sig  . albuterol (PROVENTIL HFA;VENTOLIN HFA) 108 (90 Base) MCG/ACT inhaler Inhale 2 puffs into the lungs every 6 (six) hours as needed for wheezing or shortness of breath.  . ALPRAZolam (XANAX) 0.5 MG tablet TAKE 1 TABLET BY MOUTH THREE TIMES DAILY AS NEEDED FOR ANXIETY  . aspirin 81 MG tablet Take 81 mg by mouth daily.  . Cholecalciferol (VITAMIN D3) 5000 UNITS CAPS Take 1 capsule by mouth daily.  . Coenzyme Q10 (CO Q 10) 100 MG CAPS Take 1 capsule by mouth daily.  . Cranberry 425 MG CAPS Take 2 capsules by mouth daily.  . fluticasone (FLONASE) 50 MCG/ACT nasal spray Place 2 sprays into both nostrils 2 (two) times daily.  Marland Kitchen FOLIC ACID PO Take XX123456 mcg by mouth daily.   . furosemide (LASIX) 20 MG tablet TAKE 1 TABLET BY MOUTH EVERY DAY  . gabapentin (NEURONTIN) 300 MG capsule TAKE 1 CAPSULE  BY MOUTH THREE TIMES DAILY  . hydroxychloroquine (PLAQUENIL) 200 MG tablet Take 1 tablet by mouth 2 (two) times daily.  . metoprolol succinate (TOPROL-XL) 50 MG 24 hr tablet TAKE 1 TABLET BY MOUTH EVERY DAY  . Milk Thistle 250 MG CAPS Take 1 capsule by mouth daily.  . Multiple Vitamin (MULTIVITAMIN) tablet Take 1 tablet by mouth daily.    Marland Kitchen omeprazole (PRILOSEC) 20 MG capsule TAKE 1 CAPSULE BY MOUTH EVERY DAY  . polyethylene glycol (GLYCOLAX) packet Take 34 g by mouth daily.   . pravastatin (PRAVACHOL) 20 MG tablet TAKE 1 TABLET BY MOUTH EVERY DAY  . Probiotic Product (ALIGN) 4 MG CAPS Take 1 capsule by mouth daily.   . Selenium 200 MCG CAPS Take 1 capsule by mouth daily.  . solifenacin (VESICARE) 5 MG tablet Take 5 mg by mouth at  bedtime. For bladder spasms  . traMADol (ULTRAM) 50 MG tablet TAKE 1 TABLET BY MOUTH FOUR TIMES DAILY AS NEEDED  . Turmeric 500 MG CAPS Take 500 mg by mouth daily.  . vitamin B-12 (CYANOCOBALAMIN) 500 MCG tablet Take 500 mcg by mouth daily.  . methotrexate (RHEUMATREX) 2.5 MG tablet Take 10 tablets by mouth once a week.  . [DISCONTINUED] levofloxacin (LEVAQUIN) 500 MG tablet Take 1 tablet (500 mg total) by mouth daily.  . [DISCONTINUED] pravastatin (PRAVACHOL) 20 MG tablet TAKE 1 TABLET BY MOUTH EVERY DAY  . [DISCONTINUED] predniSONE (DELTASONE) 20 MG tablet Take 2 tablets (40 mg total) by mouth daily.   Facility-Administered Encounter Medications as of 12/06/2016  Medication  . 0.9 %  sodium chloride infusion  . omalizumab Arvid Right) injection 375 mg  . omalizumab Arvid Right) injection 375 mg  . omalizumab Arvid Right) injection 375 mg     Review of Systems Constitutional:   No  weight loss, night sweats,  Fevers, chills,  +fatigue, or  lassitude.  HEENT:   No headaches,  Difficulty swallowing,  Tooth/dental problems, or  Sore throat,                No sneezing, itching, ear ache, + nasal congestion, post nasal drip,   CV:  No chest pain,  Orthopnea, PND, swelling in lower extremities, anasarca, dizziness, palpitations, syncope.   GI  No heartburn, indigestion, abdominal pain, nausea, vomiting, diarrhea, change in bowel habits, loss of appetite, bloody stools.   Resp: .  No chest wall deformity  Skin: no rash or lesions.  GU: no dysuria, change in color of urine, no urgency or frequency.  No flank pain, no hematuria   MS:  No joint pain or swelling.  No decreased range of motion.  No back pain.  Psych:  No change in mood or affect. No depression or anxiety.  No memory loss.        Physical Exam  BP 110/60   Pulse 69   Temp 98 F (36.7 C) (Oral)   Ht 5\' 11"  (1.803 m)   Wt 218 lb 6.4 oz (99.1 kg)   SpO2 94%   BMI 30.46 kg/m   GEN: A/Ox3; pleasant , NAD, elderly    HEENT:  [place]/AT,  EACs-clear, TMs-wnl, NOSE-clear, THROAT-clear, no lesions, no postnasal drip or exudate noted.   NECK:  Supple w/ fair ROM; no JVD; normal carotid impulses w/o bruits; no thyromegaly or nodules palpated; no lymphadenopathy.  RESP  Decreased BS in bases w/o, wheezes/ rales/ or rhonchi.no accessory muscle use, no dullness to percussion  CARD:  RRR, no m/r/g  , no peripheral edema,  pulses intact, no cyanosis or clubbing.  GI:   Soft & nt; nml bowel sounds; no organomegaly or masses detected.  GU chronic foley   Musco: Warm bil, no deformities or joint swelling noted.   Neuro: alert, no focal deficits noted.    Skin: Warm, no lesions or rashes   Lab Results:  CBC    Component Value Date/Time   WBC 6.4 05/18/2016 1344   RBC 4.22 05/18/2016 1344   HGB 13.6 05/18/2016 1344   HGB 11.1 (L) 09/28/2014 1610   HCT 40.4 05/18/2016 1344   HCT 33.6 (L) 09/28/2014 1610   PLT 166.0 05/18/2016 1344   PLT 192 09/28/2014 1610   MCV 95.6 05/18/2016 1344   MCV 96 09/28/2014 1610   MCH 30.8 09/10/2015 0945   MCHC 33.6 05/18/2016 1344   RDW 15.0 05/18/2016 1344   RDW 16.9 (H) 09/28/2014 1610   LYMPHSABS 1.2 05/18/2016 1344   LYMPHSABS 1.3 09/09/2014 0652   MONOABS 0.5 05/18/2016 1344   MONOABS 0.4 09/09/2014 0652   EOSABS 0.2 05/18/2016 1344   EOSABS 0.5 09/09/2014 0652   BASOSABS 0.0 05/18/2016 1344   BASOSABS 0.1 09/09/2014 0652    BMET    Component Value Date/Time   NA 135 05/18/2016 1344   NA 138 09/28/2014 1610   K 4.1 05/18/2016 1344   K 4.6 09/28/2014 1610   CL 99 05/18/2016 1344   CL 101 09/28/2014 1610   CO2 32 05/18/2016 1344   CO2 30 09/28/2014 1610   GLUCOSE 86 05/18/2016 1344   GLUCOSE 137 (H) 09/28/2014 1610   GLUCOSE 109 (H) 10/05/2006 1611   BUN 15 05/18/2016 1344   BUN 16 09/28/2014 1610   CREATININE 0.61 05/18/2016 1344   CREATININE 0.62 09/28/2014 1610   CALCIUM 9.2 05/18/2016 1344   CALCIUM 8.2 (L) 09/28/2014 1610   GFRNONAA >60  09/10/2015 0945   GFRNONAA >60 09/28/2014 1610   GFRNONAA >60 08/06/2014 1339   GFRAA >60 09/10/2015 0945   GFRAA >60 09/28/2014 1610   GFRAA >60 08/06/2014 1339    BNP No results found for: BNP  ProBNP No results found for: PROBNP  Imaging: Dg Chest 2 View  Result Date: 12/06/2016 CLINICAL DATA:  Follow-up of pneumonia. No current complaints. History of asthma, coronary artery disease, hypertension, and prostate malignancy. EXAM: CHEST  2 VIEW COMPARISON:  PA and lateral chest x-ray of November 14, 2016 FINDINGS: There is persistently increased density in the left lower lobe new since May of 2016. There is a small left pleural effusion. The right lung is clear. There is an azygos lobe anatomy on the right. The heart and pulmonary vascularity are normal. There is calcification in the wall of the aortic arch. There is severe degenerative change of both shoulders. There is an azygos lobe anatomy on the right IMPRESSION: Left basilar atelectasis or pneumonia unchanged since November 14, 2016, but it is new since the March 2016 study. Given the lack of significant improvement, chest CT scanning is recommended to further evaluate the lung parenchyma. Thoracic aortic atherosclerosis. Electronically Signed   By: David  Martinique M.D.   On: 12/06/2016 15:22   Dg Chest 2 View  Result Date: 11/14/2016 CLINICAL DATA:  83 y/o  M; asthma with acute exacerbation. EXAM: CHEST  2 VIEW COMPARISON:  03/31/2015 chest radiograph. FINDINGS: Stable cardiac silhouette within normal limits. Aortic atherosclerosis with arch calcification. Partially visualized degenerative changes of the shoulder joints. Mild bronchitic markings. Left basilar opacity with elevated left hemidiaphragm probably  represents atelectasis. Pneumonia is not excluded. IMPRESSION: Mild bronchitic markings. Left basilar opacity with elevated left hemidiaphragm probably represents atelectasis. Pneumonia is not excluded. Aortic atherosclerosis.  Electronically Signed   By: Kristine Garbe M.D.   On: 11/14/2016 14:52     Assessment & Plan:   Lobar pneumonia ([place]) LLL PNA clinically patient is better . CXR does not show significant change ? Lag time  He is a never smoker (but immunosuprressed)  Will give him some additional time for clearance , check cxr in 3 weeks , if not better consider CT chest at that time  Hold on additional abx at this time. If cough returns with mucus could consider sputum cx  He is going to hold MTX for couple more weeks then resume   Plan Patient Instructions  Continue on current regimen.  Follow up with Dr. Halford Chessman  In 3-4 weeks with chest xray and As needed   Please contact office for sooner follow up if symptoms do not improve or worsen or seek emergency care      Asthma with acute exacerbation Recent flare with PNA now resolved He is improving , can likely restart Xolair next week if continue to improve Discussed with CMA [name] that [place] has been approved for 2 yrs with insurance .  Plan  Patient Instructions  Continue on current regimen.  Follow up with Dr. Halford Chessman  In 3-4 weeks with chest xray and As needed   Please contact office for sooner follow up if symptoms do not improve or worsen or seek emergency care         Rexene Edison, NP 12/06/2016

## 2016-12-06 NOTE — Patient Instructions (Signed)
Continue on current regimen.  Follow up with Dr. Halford Chessman  In 3-4 weeks with chest xray and As needed   Please contact office for sooner follow up if symptoms do not improve or worsen or seek emergency care

## 2016-12-06 NOTE — Telephone Encounter (Signed)
Spoke with patient-he is aware that Xolair has been approved 02-2016 through 02-2018. Nothing more needed at his time.

## 2016-12-06 NOTE — Assessment & Plan Note (Signed)
LLL PNA clinically patient is better . CXR does not show significant change ? Lag time  He is a never smoker (but immunosuprressed)  Will give him some additional time for clearance , check cxr in 3 weeks , if not better consider CT chest at that time  Hold on additional abx at this time. If cough returns with mucus could consider sputum cx  He is going to hold MTX for couple more weeks then resume   Plan Patient Instructions  Continue on current regimen.  Follow up with Dr. Halford Chessman  In 3-4 weeks with chest xray and As needed   Please contact office for sooner follow up if symptoms do not improve or worsen or seek emergency care

## 2016-12-07 NOTE — Progress Notes (Signed)
I have reviewed and agree with assessment/plan.  Chesley Mires, MD [facility] 12/07/2016, 11:25 AM Pager:  [phone]

## 2016-12-08 DIAGNOSIS — N319 Neuromuscular dysfunction of bladder, unspecified: Secondary | ICD-10-CM | POA: Diagnosis not present

## 2016-12-11 ENCOUNTER — Inpatient Hospital Stay
Admission: EM | Admit: 2016-12-11 | Discharge: 2016-12-15 | DRG: 698 | Disposition: A | Discharge status: Home Health | Payer: Medicare PPO | Attending: Internal Medicine | Admitting: Internal Medicine

## 2016-12-11 ENCOUNTER — Emergency Department: Payer: Medicare PPO

## 2016-12-11 DIAGNOSIS — J4520 Mild intermittent asthma, uncomplicated: Secondary | ICD-10-CM | POA: Diagnosis present

## 2016-12-11 DIAGNOSIS — J9811 Atelectasis: Secondary | ICD-10-CM | POA: Diagnosis not present

## 2016-12-11 DIAGNOSIS — Z96653 Presence of artificial knee joint, bilateral: Secondary | ICD-10-CM | POA: Diagnosis present

## 2016-12-11 DIAGNOSIS — F329 Major depressive disorder, single episode, unspecified: Secondary | ICD-10-CM | POA: Diagnosis present

## 2016-12-11 DIAGNOSIS — I10 Essential (primary) hypertension: Secondary | ICD-10-CM | POA: Diagnosis not present

## 2016-12-11 DIAGNOSIS — Y846 Urinary catheterization as the cause of abnormal reaction of the patient, or of later complication, without mention of misadventure at the time of the procedure: Secondary | ICD-10-CM | POA: Diagnosis present

## 2016-12-11 DIAGNOSIS — J189 Pneumonia, unspecified organism: Secondary | ICD-10-CM | POA: Diagnosis not present

## 2016-12-11 DIAGNOSIS — R0602 Shortness of breath: Secondary | ICD-10-CM

## 2016-12-11 DIAGNOSIS — M069 Rheumatoid arthritis, unspecified: Secondary | ICD-10-CM | POA: Diagnosis not present

## 2016-12-11 DIAGNOSIS — E871 Hypo-osmolality and hyponatremia: Secondary | ICD-10-CM | POA: Diagnosis not present

## 2016-12-11 DIAGNOSIS — E222 Syndrome of inappropriate secretion of antidiuretic hormone: Secondary | ICD-10-CM | POA: Diagnosis present

## 2016-12-11 DIAGNOSIS — Z79899 Other long term (current) drug therapy: Secondary | ICD-10-CM | POA: Diagnosis not present

## 2016-12-11 DIAGNOSIS — I2510 Atherosclerotic heart disease of native coronary artery without angina pectoris: Secondary | ICD-10-CM | POA: Diagnosis present

## 2016-12-11 DIAGNOSIS — E876 Hypokalemia: Secondary | ICD-10-CM | POA: Diagnosis not present

## 2016-12-11 DIAGNOSIS — N390 Urinary tract infection, site not specified: Secondary | ICD-10-CM | POA: Diagnosis not present

## 2016-12-11 DIAGNOSIS — E785 Hyperlipidemia, unspecified: Secondary | ICD-10-CM | POA: Diagnosis present

## 2016-12-11 DIAGNOSIS — B961 Klebsiella pneumoniae [K. pneumoniae] as the cause of diseases classified elsewhere: Secondary | ICD-10-CM | POA: Diagnosis present

## 2016-12-11 DIAGNOSIS — N319 Neuromuscular dysfunction of bladder, unspecified: Secondary | ICD-10-CM | POA: Diagnosis present

## 2016-12-11 DIAGNOSIS — K219 Gastro-esophageal reflux disease without esophagitis: Secondary | ICD-10-CM | POA: Diagnosis present

## 2016-12-11 DIAGNOSIS — J440 Chronic obstructive pulmonary disease with acute lower respiratory infection: Secondary | ICD-10-CM | POA: Diagnosis present

## 2016-12-11 DIAGNOSIS — D696 Thrombocytopenia, unspecified: Secondary | ICD-10-CM | POA: Diagnosis present

## 2016-12-11 DIAGNOSIS — G4733 Obstructive sleep apnea (adult) (pediatric): Secondary | ICD-10-CM | POA: Diagnosis present

## 2016-12-11 DIAGNOSIS — Z7982 Long term (current) use of aspirin: Secondary | ICD-10-CM | POA: Diagnosis not present

## 2016-12-11 DIAGNOSIS — F419 Anxiety disorder, unspecified: Secondary | ICD-10-CM | POA: Diagnosis present

## 2016-12-11 DIAGNOSIS — R262 Difficulty in walking, not elsewhere classified: Secondary | ICD-10-CM

## 2016-12-11 DIAGNOSIS — M6281 Muscle weakness (generalized): Secondary | ICD-10-CM

## 2016-12-11 DIAGNOSIS — T83511A Infection and inflammatory reaction due to indwelling urethral catheter, initial encounter: Secondary | ICD-10-CM | POA: Diagnosis not present

## 2016-12-11 DIAGNOSIS — R05 Cough: Secondary | ICD-10-CM | POA: Diagnosis not present

## 2016-12-11 DIAGNOSIS — Z8546 Personal history of malignant neoplasm of prostate: Secondary | ICD-10-CM

## 2016-12-11 DIAGNOSIS — D72829 Elevated white blood cell count, unspecified: Secondary | ICD-10-CM | POA: Diagnosis not present

## 2016-12-11 DIAGNOSIS — R509 Fever, unspecified: Secondary | ICD-10-CM | POA: Diagnosis not present

## 2016-12-11 LAB — COMPREHENSIVE METABOLIC PANEL
ALT: 11 U/L — AB (ref 17–63)
AST: 18 U/L (ref 15–41)
Albumin: 3.5 g/dL (ref 3.5–5.0)
Alkaline Phosphatase: 52 U/L (ref 38–126)
Anion gap: 6 (ref 5–15)
BUN: 17 mg/dL (ref 6–20)
CALCIUM: 8.4 mg/dL — AB (ref 8.9–10.3)
CHLORIDE: 98 mmol/L — AB (ref 101–111)
CO2: 28 mmol/L (ref 22–32)
CREATININE: 0.87 mg/dL (ref 0.61–1.24)
Glucose, Bld: 148 mg/dL — ABNORMAL HIGH (ref 65–99)
Potassium: 4.3 mmol/L (ref 3.5–5.1)
Sodium: 132 mmol/L — ABNORMAL LOW (ref 135–145)
Total Bilirubin: 1.3 mg/dL — ABNORMAL HIGH (ref 0.3–1.2)
Total Protein: 6.2 g/dL — ABNORMAL LOW (ref 6.5–8.1)

## 2016-12-11 LAB — URINALYSIS, COMPLETE (UACMP) WITH MICROSCOPIC
Bilirubin Urine: NEGATIVE
GLUCOSE, UA: NEGATIVE mg/dL
Ketones, ur: NEGATIVE mg/dL
NITRITE: POSITIVE — AB
Protein, ur: 100 mg/dL — AB
SPECIFIC GRAVITY, URINE: 1.019 (ref 1.005–1.030)
pH: 5 (ref 5.0–8.0)

## 2016-12-11 LAB — INFLUENZA PANEL BY PCR (TYPE A & B)
INFLAPCR: NEGATIVE
INFLBPCR: NEGATIVE

## 2016-12-11 LAB — CBC WITH DIFFERENTIAL/PLATELET
Basophils Absolute: 0.2 10*3/uL — ABNORMAL HIGH (ref 0–0.1)
Basophils Relative: 1 %
EOS ABS: 0 10*3/uL (ref 0–0.7)
EOS PCT: 0 %
HCT: 38.5 % — ABNORMAL LOW (ref 40.0–52.0)
HEMOGLOBIN: 13.6 g/dL (ref 13.0–18.0)
Lymphocytes Relative: 3 %
Lymphs Abs: 0.5 10*3/uL — ABNORMAL LOW (ref 1.0–3.6)
MCH: 33.5 pg (ref 26.0–34.0)
MCHC: 35.3 g/dL (ref 32.0–36.0)
MCV: 94.8 fL (ref 80.0–100.0)
MONO ABS: 0.8 10*3/uL (ref 0.2–1.0)
Monocytes Relative: 5 %
NEUTROS PCT: 91 %
Neutro Abs: 15.3 10*3/uL — ABNORMAL HIGH (ref 1.4–6.5)
Platelets: 139 10*3/uL — ABNORMAL LOW (ref 150–440)
RBC: 4.07 MIL/uL — AB (ref 4.40–5.90)
RDW: 14.4 % (ref 11.5–14.5)
WBC: 16.8 10*3/uL — AB (ref 3.8–10.6)

## 2016-12-11 MED ORDER — ALPRAZOLAM 0.5 MG PO TABS
0.5000 mg | ORAL_TABLET | Freq: Three times a day (TID) | ORAL | Status: DC | PRN
  Administered 2016-12-12 – 2016-12-15 (×3): 0.5 mg via ORAL
  Filled 2016-12-11 (×4): qty 1

## 2016-12-11 MED ORDER — ALBUTEROL SULFATE (2.5 MG/3ML) 0.083% IN NEBU: 2.5000 mg | INHALATION_SOLUTION | RESPIRATORY_TRACT | Status: DC | PRN

## 2016-12-11 MED ORDER — PANTOPRAZOLE SODIUM 40 MG PO TBEC
40.0000 mg | DELAYED_RELEASE_TABLET | Freq: Every day | ORAL | Status: DC
  Administered 2016-12-12 – 2016-12-15 (×4): 40 mg via ORAL
  Filled 2016-12-11 (×4): qty 1

## 2016-12-11 MED ORDER — POLYETHYLENE GLYCOL 3350 17 G PO PACK
34.0000 g | PACK | Freq: Every day | ORAL | Status: DC
  Administered 2016-12-12 – 2016-12-13 (×2): 34 g via ORAL
  Administered 2016-12-15: 17 g via ORAL
  Filled 2016-12-11 (×3): qty 2
  Filled 2016-12-11: qty 1

## 2016-12-11 MED ORDER — PIPERACILLIN-TAZOBACTAM 3.375 G IVPB
3.3750 g | Freq: Once | INTRAVENOUS | Status: DC
  Administered 2016-12-11: 3.375 g via INTRAVENOUS
  Filled 2016-12-11: qty 50

## 2016-12-11 MED ORDER — CRANBERRY 425 MG PO CAPS: 2.0000 | ORAL_CAPSULE | Freq: Every day | ORAL | Status: DC

## 2016-12-11 MED ORDER — ACETAMINOPHEN 325 MG PO TABS
650.0000 mg | ORAL_TABLET | Freq: Four times a day (QID) | ORAL | Status: DC | PRN
  Administered 2016-12-11 – 2016-12-13 (×3): 650 mg via ORAL
  Filled 2016-12-11 (×3): qty 2

## 2016-12-11 MED ORDER — DEXTROSE 5 % IV SOLN
500.0000 mg | INTRAVENOUS | Status: DC
  Administered 2016-12-12 – 2016-12-14 (×4): 500 mg via INTRAVENOUS
  Filled 2016-12-11 (×5): qty 500

## 2016-12-11 MED ORDER — HYDROXYCHLOROQUINE SULFATE 200 MG PO TABS
200.0000 mg | ORAL_TABLET | Freq: Two times a day (BID) | ORAL | Status: DC
Start: 2016-12-11 — End: 2016-12-15
  Administered 2016-12-12 – 2016-12-15 (×8): 200 mg via ORAL
  Filled 2016-12-11 (×8): qty 1

## 2016-12-11 MED ORDER — VITAMIN D 1000 UNITS PO TABS
5000.0000 [IU] | ORAL_TABLET | Freq: Every day | ORAL | Status: DC
  Administered 2016-12-13 – 2016-12-15 (×3): 5000 [IU] via ORAL
  Filled 2016-12-11 (×4): qty 5

## 2016-12-11 MED ORDER — RISAQUAD PO CAPS
1.0000 | ORAL_CAPSULE | Freq: Every day | ORAL | Status: DC
  Administered 2016-12-12 – 2016-12-15 (×4): 1 via ORAL
  Filled 2016-12-11 (×4): qty 1

## 2016-12-11 MED ORDER — VITAMIN B-12 1000 MCG PO TABS
500.0000 ug | ORAL_TABLET | Freq: Every day | ORAL | Status: DC
  Administered 2016-12-13 – 2016-12-15 (×3): 500 ug via ORAL
  Filled 2016-12-11 (×2): qty 2
  Filled 2016-12-11 (×3): qty 1

## 2016-12-11 MED ORDER — SODIUM CHLORIDE 0.9 % IV SOLN
INTRAVENOUS | Status: DC
  Administered 2016-12-11: 22:00:00 via INTRAVENOUS

## 2016-12-11 MED ORDER — ONDANSETRON HCL 4 MG/2ML IJ SOLN: 4.0000 mg | Freq: Four times a day (QID) | INTRAMUSCULAR | Status: DC | PRN

## 2016-12-11 MED ORDER — GABAPENTIN 300 MG PO CAPS
300.0000 mg | ORAL_CAPSULE | Freq: Three times a day (TID) | ORAL | Status: DC
Start: 2016-12-11 — End: 2016-12-15
  Administered 2016-12-12 – 2016-12-15 (×12): 300 mg via ORAL
  Filled 2016-12-11 (×13): qty 1

## 2016-12-11 MED ORDER — ALBUTEROL SULFATE HFA 108 (90 BASE) MCG/ACT IN AERS: 2.0000 | INHALATION_SPRAY | Freq: Four times a day (QID) | RESPIRATORY_TRACT | Status: DC | PRN

## 2016-12-11 MED ORDER — GUAIFENESIN 100 MG/5ML PO SOLN
5.0000 mL | ORAL | Status: DC | PRN
  Administered 2016-12-13: 100 mg via ORAL
  Filled 2016-12-11 (×2): qty 5

## 2016-12-11 MED ORDER — TRAMADOL HCL 50 MG PO TABS: 50.0000 mg | ORAL_TABLET | Freq: Four times a day (QID) | ORAL | Status: DC | PRN

## 2016-12-11 MED ORDER — ACETAMINOPHEN 650 MG RE SUPP: 650.0000 mg | Freq: Four times a day (QID) | RECTAL | Status: DC | PRN

## 2016-12-11 MED ORDER — SELENIUM 200 MCG PO CAPS: 1.0000 | ORAL_CAPSULE | Freq: Every day | ORAL | Status: DC

## 2016-12-11 MED ORDER — VANCOMYCIN HCL IN DEXTROSE 1-5 GM/200ML-% IV SOLN
1000.0000 mg | Freq: Once | INTRAVENOUS | Status: DC
Start: 2016-12-11 — End: 2016-12-11
  Administered 2016-12-11: 1000 mg via INTRAVENOUS
  Filled 2016-12-11: qty 200

## 2016-12-11 MED ORDER — DARIFENACIN HYDROBROMIDE ER 7.5 MG PO TB24
7.5000 mg | ORAL_TABLET | Freq: Every day | ORAL | Status: DC
  Administered 2016-12-13 – 2016-12-15 (×3): 7.5 mg via ORAL
  Filled 2016-12-11 (×5): qty 1

## 2016-12-11 MED ORDER — ONDANSETRON HCL 4 MG PO TABS: 4.0000 mg | ORAL_TABLET | Freq: Four times a day (QID) | ORAL | Status: DC | PRN

## 2016-12-11 MED ORDER — METOPROLOL SUCCINATE ER 50 MG PO TB24
50.0000 mg | ORAL_TABLET | Freq: Every day | ORAL | Status: DC
  Administered 2016-12-13: 50 mg via ORAL
  Filled 2016-12-11 (×2): qty 1

## 2016-12-11 MED ORDER — MILK THISTLE 250 MG PO CAPS: 1.0000 | ORAL_CAPSULE | Freq: Every day | ORAL | Status: DC

## 2016-12-11 MED ORDER — FLUTICASONE PROPIONATE 50 MCG/ACT NA SUSP
2.0000 | Freq: Two times a day (BID) | NASAL | Status: DC
  Administered 2016-12-12 – 2016-12-15 (×7): 2 via NASAL
  Filled 2016-12-11: qty 16

## 2016-12-11 MED ORDER — FOLIC ACID 1 MG PO TABS
1000.0000 ug | ORAL_TABLET | Freq: Every day | ORAL | Status: DC
  Administered 2016-12-12 – 2016-12-15 (×4): 1 mg via ORAL
  Filled 2016-12-11 (×4): qty 1

## 2016-12-11 MED ORDER — ENOXAPARIN SODIUM 40 MG/0.4ML ~~LOC~~ SOLN
40.0000 mg | SUBCUTANEOUS | Status: DC
  Administered 2016-12-12 – 2016-12-14 (×4): 40 mg via SUBCUTANEOUS
  Filled 2016-12-11 (×4): qty 0.4

## 2016-12-11 MED ORDER — PRAVASTATIN SODIUM 20 MG PO TABS
20.0000 mg | ORAL_TABLET | Freq: Every day | ORAL | Status: DC
Start: 2016-12-12 — End: 2016-12-15
  Administered 2016-12-12 – 2016-12-14 (×3): 20 mg via ORAL
  Filled 2016-12-11 (×3): qty 1

## 2016-12-11 MED ORDER — BISACODYL 5 MG PO TBEC: 5.0000 mg | DELAYED_RELEASE_TABLET | Freq: Every day | ORAL | Status: DC | PRN

## 2016-12-11 MED ORDER — ASPIRIN EC 81 MG PO TBEC
81.0000 mg | DELAYED_RELEASE_TABLET | Freq: Every day | ORAL | Status: DC
  Administered 2016-12-12 – 2016-12-15 (×4): 81 mg via ORAL
  Filled 2016-12-11 (×5): qty 1

## 2016-12-11 MED ORDER — TURMERIC 500 MG PO CAPS: 500.0000 mg | ORAL_CAPSULE | Freq: Every day | ORAL | Status: DC

## 2016-12-11 MED ORDER — SENNOSIDES-DOCUSATE SODIUM 8.6-50 MG PO TABS: 1.0000 | ORAL_TABLET | Freq: Every evening | ORAL | Status: DC | PRN

## 2016-12-11 MED ORDER — CEFTRIAXONE SODIUM-DEXTROSE 1-3.74 GM-% IV SOLR
1.0000 g | INTRAVENOUS | Status: DC
  Administered 2016-12-12 – 2016-12-14 (×4): 1 g via INTRAVENOUS
  Filled 2016-12-11 (×4): qty 50

## 2016-12-11 MED ORDER — CO Q 10 100 MG PO CAPS: 1.0000 | ORAL_CAPSULE | Freq: Every day | ORAL | Status: DC

## 2016-12-11 MED ORDER — DEXTROSE 5 % IV SOLN: 1.0000 g | INTRAVENOUS | Status: DC

## 2016-12-11 NOTE — ED Notes (Signed)
2045 Attempting IV access prior to admission.

## 2016-12-11 NOTE — ED Notes (Signed)
Called floor to up date that we had just gotten IV and that patient would be coming to the floor.  Updated RN that unable to locate IV pump but would send IV antibiotics up with patient to decrease wait time.

## 2016-12-11 NOTE — Progress Notes (Signed)
PHARMACIST - PHYSICIAN ORDER COMMUNICATION  CONCERNING: P&T Medication Policy on Herbal Medications  DESCRIPTION:  This patient's order for:  Cranberry, Co Q10, Milk Thistle, Selenium, Tumeric  has been noted.  This product(s) is classified as an "herbal" or natural product. Due to a lack of definitive safety studies or FDA approval, nonstandard manufacturing practices, plus the potential risk of unknown drug-drug interactions while on inpatient medications, the Pharmacy and Therapeutics Committee does not permit the use of "herbal" or natural products of this type within [facility].   ACTION TAKEN: The pharmacy department is unable to verify this order at this time and your patient has been informed of this safety policy. Please reevaluate patient's clinical condition at discharge and address if the herbal or natural product(s) should be resumed at that time.

## 2016-12-11 NOTE — ED Notes (Signed)
Dr. Bridgett Larsson in with patient and family.

## 2016-12-11 NOTE — ED Notes (Signed)
Report called to floor, given to [place], Therapist, sports.

## 2016-12-11 NOTE — ED Notes (Signed)
Patient denies pain and is resting comfortably.  

## 2016-12-11 NOTE — ED Notes (Signed)
Leg bag changed out to collect urine specimen.  Continue to monitor.

## 2016-12-11 NOTE — ED Notes (Signed)
FIRST NURSE NOTE: Pt has been fighting pneumonia since Christmas, had a temp of 102.5 orally this morning.  Took 400mg  of Ibuprofen at 430am

## 2016-12-11 NOTE — H&P (Signed)
[place] [facility] [place] NAME: [name]    MR#:  [id number]  DATE OF BIRTH:  1934-02-11  DATE OF ADMISSION:  12/11/2016  PRIMARY CARE PHYSICIAN: Viviana Simpler, MD   REQUESTING/REFERRING PHYSICIAN: Earleen Newport, MD  CHIEF COMPLAINT:   Chief Complaint  Patient presents with  . Fever  . Cough   Fever and cough since yesterday. HISTORY OF PRESENT ILLNESS:  [name]  is a 83 y.o. male with a known history of Recent pneumonia, hypertension, hyponatremia, CAD and processes cancer. The patient was diagnosed with pneumonia 1 months ago and treated with 10 days of Levaquin. He is on chronic Foley catheter. He started to have fever and chills, cough and generalized weakness since yesterday. Chest x-ray show atelectasis but the CAT scan of the chest show infiltrate. Urinalysis show UTI. PAST MEDICAL HISTORY:   Past Medical History:  Diagnosis Date  . Allergy   . Anemia   . Anxiety   . Arthritis    rheumatoid  . Asthma   . CAD (coronary artery disease)   . Cancer [facility])    Prostate  . Chronic sinusitis   . Depression   . Diverticulitis   . ED (erectile dysfunction)   . GERD (gastroesophageal reflux disease)   . History of SIADH   . Hx of adenomatous colonic polyps   . Hypertension   . Hyponatremia   . IBS (irritable bowel syndrome)   . Neurodermatitis   . Neurogenic bladder   . OSA (obstructive sleep apnea)     PAST SURGICAL HISTORY:   Past Surgical History:  Procedure Laterality Date  . KNEE ARTHROPLASTY Right 09/02/2015   Procedure: COMPUTER ASSISTED TOTAL KNEE ARTHROPLASTY;  Surgeon: [name], MD;  Location: [facility] ORS;  Service: Orthopedics;  Laterality: Right;  . KNEE SURGERY Left    ARTHROSCOPY LEFT  . NASAL SINUS SURGERY  2009   DEVIATED SEPTUM AND POLYPS  . PROSTATE SURGERY     PROSTATECTOMY  . TOTAL KNEE ARTHROPLASTY Left 9/15   Dr [name]  . URETHRAL STRICTURE DILATATION  02-2010   Dr.Cope    SOCIAL  HISTORY:   Social History  Substance Use Topics  . Smoking status: Never Smoker  . Smokeless tobacco: Never Used  . Alcohol use 6.0 oz/week    10 Standard drinks or equivalent per week    FAMILY HISTORY:   Family History  Problem Relation Age of Onset  . Heart disease Mother 37    heart failure  . Pneumonia Father 47    pnemonia  . Colon cancer Neg Hx     DRUG ALLERGIES:   Allergies  Allergen Reactions  . Ciprofloxacin Nausea And Vomiting    Headache  . Citalopram Hydrobromide Other (See Comments)    "unknown"  . Lorazepam     Adverse reaction  . Paroxetine Nausea Only  . Ramipril Other (See Comments)    "unknown"  . Simvastatin     REVIEW OF SYSTEMS:   Review of Systems  Constitutional: Positive for chills, fever and malaise/fatigue.  HENT: Negative for congestion.   Eyes: Negative for blurred vision and double vision.  Respiratory: Positive for cough. Negative for hemoptysis, sputum production, shortness of breath, wheezing and stridor.   Cardiovascular: Negative for chest pain and leg swelling.  Gastrointestinal: Negative for abdominal pain, blood in stool, diarrhea, melena, nausea and vomiting.  Genitourinary:       On chronic Foley catheter  Musculoskeletal: Negative  for joint pain.  Skin: Negative for itching and rash.  Neurological: Positive for weakness. Negative for dizziness, focal weakness and loss of consciousness.  Psychiatric/Behavioral: Negative for depression. The patient is not nervous/anxious.     MEDICATIONS AT HOME:   Prior to Admission medications   Medication Sig Start Date End Date Taking? Authorizing Provider  albuterol (PROVENTIL HFA;VENTOLIN HFA) 108 (90 Base) MCG/ACT inhaler Inhale 2 puffs into the lungs every 6 (six) hours as needed for wheezing or shortness of breath. 11/14/16  Yes [name], MD  ALPRAZolam Duanne Moron) 0.5 MG tablet TAKE 1 TABLET BY MOUTH THREE TIMES DAILY AS NEEDED FOR ANXIETY 11/22/16  Yes [name],  MD  aspirin 81 MG tablet Take 81 mg by mouth daily.   Yes Historical Provider, MD  Cholecalciferol (VITAMIN D3) 5000 UNITS CAPS Take 1 capsule by mouth daily.   Yes Historical Provider, MD  Coenzyme Q10 (CO Q 10) 100 MG CAPS Take 1 capsule by mouth daily.   Yes Historical Provider, MD  Cranberry 425 MG CAPS Take 2 capsules by mouth daily.   Yes Historical Provider, MD  fluticasone (FLONASE) 50 MCG/ACT nasal spray Place 2 sprays into both nostrils 2 (two) times daily.   Yes Historical Provider, MD  FOLIC ACID PO Take XX123456 mcg by mouth daily.    Yes Historical Provider, MD  furosemide (LASIX) 20 MG tablet TAKE 1 TABLET BY MOUTH EVERY DAY 06/30/16  Yes [name], MD  gabapentin (NEURONTIN) 300 MG capsule TAKE 1 CAPSULE BY MOUTH THREE TIMES DAILY 07/12/16  Yes [name], MD  hydroxychloroquine (PLAQUENIL) 200 MG tablet Take 1 tablet by mouth 2 (two) times daily. 01/22/16  Yes Historical Provider, MD  levofloxacin (LEVAQUIN) 500 MG tablet Take 500 mg by mouth daily.   Yes Historical Provider, MD  methotrexate (RHEUMATREX) 2.5 MG tablet Take 10 tablets by mouth once a week. 01/07/16  Yes Historical Provider, MD  metoprolol succinate (TOPROL-XL) 50 MG 24 hr tablet TAKE 1 TABLET BY MOUTH EVERY DAY 01/11/16  Yes [name], MD  Milk Thistle 250 MG CAPS Take 1 capsule by mouth daily.   Yes Historical Provider, MD  Multiple Vitamin (MULTIVITAMIN) tablet Take 1 tablet by mouth daily.     Yes Historical Provider, MD  omeprazole (PRILOSEC) 20 MG capsule TAKE 1 CAPSULE BY MOUTH EVERY DAY 03/24/16  Yes [name], MD  polyethylene glycol [facility]) packet Take 34 g by mouth daily.    Yes Historical Provider, MD  pravastatin (PRAVACHOL) 20 MG tablet TAKE 1 TABLET BY MOUTH EVERY DAY 11/18/16  Yes [name], MD  predniSONE (DELTASONE) 20 MG tablet Take 40 mg by mouth daily with breakfast.   Yes Historical Provider, MD  Probiotic Product (ALIGN) 4 MG CAPS Take 1 capsule by mouth daily.     Yes Historical Provider, MD  Selenium 200 MCG CAPS Take 1 capsule by mouth daily.   Yes Historical Provider, MD  solifenacin (VESICARE) 5 MG tablet Take 5 mg by mouth at bedtime. For bladder spasms   Yes Historical Provider, MD  traMADol (ULTRAM) 50 MG tablet TAKE 1 TABLET BY MOUTH FOUR TIMES DAILY AS NEEDED 10/24/16  Yes [name], MD  Turmeric 500 MG CAPS Take 500 mg by mouth daily.   Yes Historical Provider, MD  vitamin B-12 (CYANOCOBALAMIN) 500 MCG tablet Take 500 mcg by mouth daily.   Yes Historical Provider, MD      VITAL SIGNS:  Blood pressure  119/60, pulse 87, temperature 99.4 F (37.4 C), temperature source Oral, resp. rate 16, height 5\' 11"  (1.803 m), weight 218 lb (98.9 kg), SpO2 90 %.  PHYSICAL EXAMINATION:  Physical Exam  GENERAL:  83 y.o.-year-old patient lying in the bed with no acute distress.  EYES: Pupils equal, round, reactive to light and accommodation. No scleral icterus. Extraocular muscles intact.  HEENT: Head atraumatic, normocephalic. Oropharynx and nasopharynx clear. Very dry oral mucosa. NECK:  Supple, no jugular venous distention. No thyroid enlargement, no tenderness.  LUNGS: Normal breath sounds bilaterally, no wheezing, rales,rhonchi or crepitation. No use of accessory muscles of respiration.  CARDIOVASCULAR: S1, S2 normal. No murmurs, rubs, or gallops.  ABDOMEN: Soft, nontender, nondistended. Bowel sounds present. No organomegaly or mass.  EXTREMITIES: No pedal edema, cyanosis, or clubbing.  NEUROLOGIC: Cranial nerves II through XII are intact. Muscle strength 5/5 in all extremities. Sensation intact. Gait not checked.  PSYCHIATRIC: The patient is alert and oriented x 3.  SKIN: No obvious rash, lesion, or ulcer.   LABORATORY PANEL:   CBC  Recent Labs Lab 12/11/16 1506  WBC 16.8*  HGB 13.6  HCT 38.5*  PLT 139*   ------------------------------------------------------------------------------------------------------------------  Chemistries    Recent Labs Lab 12/11/16 1506  NA 132*  K 4.3  CL 98*  CO2 28  GLUCOSE 148*  BUN 17  CREATININE 0.87  CALCIUM 8.4*  AST 18  ALT 11*  ALKPHOS 52  BILITOT 1.3*   ------------------------------------------------------------------------------------------------------------------  Cardiac Enzymes No results for input(s): TROPONINI in the last 168 hours. ------------------------------------------------------------------------------------------------------------------  RADIOLOGY:  Dg Chest 2 View  Result Date: 12/11/2016 CLINICAL DATA:  Fever, had pneumonia in December 2017, fatigue, history asthma, coronary artery disease, hypertension, GERD, prostate cancer, irritable bowel syndrome EXAM: CHEST  2 VIEW COMPARISON:  12/06/2016 FINDINGS: Upper normal heart size. Atherosclerotic calcification aorta. Mediastinal contours and pulmonary vascularity normal. Azygos fissure noted. Emphysematous changes with bibasilar atelectasis greater on LEFT. Remaining lungs clear. No pleural effusion or pneumothorax. Bones demineralized with BILATERAL glenohumeral degenerative changes. IMPRESSION: Bibasilar atelectasis LEFT greater than RIGHT. Aortic atherosclerosis. Electronically Signed   By: Lavonia Dana M.D.   On: 12/11/2016 15:49   Ct Chest Wo Contrast  Result Date: 12/11/2016 CLINICAL DATA:  Persistent cough EXAM: CT CHEST WITHOUT CONTRAST TECHNIQUE: Multidetector CT imaging of the chest was performed following the standard protocol without IV contrast. COMPARISON:  Chest x-ray 12/11/2016, CT chest 07/11/2011 FINDINGS: Cardiovascular: Limited evaluation without intravenous contrast. Atherosclerotic vascular calcifications within the aorta and great vessels. No aneurysmal dilatation. Coronary artery calcifications are present. Heart size upper normal. Trace pericardial fluid. Mediastinum/Nodes: Nonspecific subcentimeter lymph nodes in the mediastinum. Thyroid within normal limits. Trachea is midline.  Asymmetric right esophageal thickening at the GE junction. There is mild asymmetric thickening of the left laryngeal tissues. Lungs/Pleura: Right azygos lobe. No significant pleural effusion. Stable calcified granuloma in the lingula. Hazy density in the posterior right lower lobe may reflect resolving infiltrate. Partial consolidation in the posterior left lower lobe. Upper Abdomen: Spleen slightly enlarged at 13 cm. Possible mild hydronephrosis upper pole right kidney. Musculoskeletal: Degenerative changes. No acute or suspicious bone lesions IMPRESSION: 1. Partial consolidation in the left lower lobe may reflect a mild pneumonia. Hazy densities in the posterior right lower lobe could reflect additional resolving infiltrate. 2. Mild asymmetric thickening of the right GE junction, consider endoscopy follow-up to exclude a mass 3. Asymmetric soft tissue thickening of the left larynx, could be further evaluated with direct visualization. 4. Slightly enlarged spleen 5.  Possible mild hydronephrosis of visualized upper pole right kidney. Electronically Signed   By: Donavan Foil M.D.   On: 12/11/2016 19:06      IMPRESSION AND PLAN:   Pneumonia, UTI and leukocytosis. The patient will be admitted to medical floor. He is treated with vancomycin and Zosyn in the ED. I will start Zithromax and Rocephin to cover CPAP and UTI, follow-up CBC and cultures. DuoNeb when necessary. Robitussin when necessary.  Hyponatremia. Start normal saline IV and follow-up BMP. Hypertension. Continue home hypertension medication but hold Lasix due to hyponatremia.  All the records are reviewed and case discussed with ED provider. Management plans discussed with the patient, His wife and daughter and they are in agreement.  CODE STATUS: Full code  TOTAL TIME TAKING CARE OF THIS PATIENT: 56 minutes.    Demetrios Loll M.D on 12/11/2016 at 7:55 PM  Between 7am to 6pm - Pager - [phone]  After 6pm go to [email or web address] -  Proofreader  [facility]  Office  [phone]  CC: Primary care physician; Viviana Simpler, MD   Note: This dictation was prepared with Dragon dictation along with smaller phrase technology. Any transcriptional errors that result from this process are unintentional.

## 2016-12-11 NOTE — ED Notes (Signed)
Return from CT Scan.  AAOx3.  Skin warm and dry. NAD 

## 2016-12-11 NOTE — ED Triage Notes (Signed)
Pt arrives to ER via POV with c/o of fever. Pt afebrile upon triage. Pt dx with pneumonia 11/14/16. Pt finished with antibiotics and was to followup for repeat xray. Pt feeling fatigued today and thought he had fever. Pt alert and oriented X4, active, cooperative, pt in NAD. RR even and unlabored, color WNL.

## 2016-12-11 NOTE — ED Notes (Signed)
Attempt to start IV x 2 unsuccessful to right hand and left ac

## 2016-12-11 NOTE — ED Provider Notes (Signed)
[facility] Emergency Department Provider Note        Time seen: ----------------------------------------- 5:08 PM on 12/11/2016 -----------------------------------------    I have reviewed the triage vital signs and the nursing notes.   HISTORY  Chief Complaint Fever and Cough    HPI [name] is a 83 y.o. male who presents to the ER for fever and cough. Patient was afebrile in triage. He was diagnosed with pneumonia approximately a month ago. He finished antibiotics and was to follow up for repeat x-ray. He is feeling fatigued today and the family noted he had a fever at home. He states overall his cough and cold symptoms have improved. He denies chills and has not had specific body aches. He denies vomiting or diarrhea.   Past Medical History:  Diagnosis Date  . Allergy   . Anemia   . Anxiety   . Arthritis    rheumatoid  . Asthma   . CAD (coronary artery disease)   . Cancer [facility])    Prostate  . Chronic sinusitis   . Depression   . Diverticulitis   . ED (erectile dysfunction)   . GERD (gastroesophageal reflux disease)   . History of SIADH   . Hx of adenomatous colonic polyps   . Hypertension   . Hyponatremia   . IBS (irritable bowel syndrome)   . Neurodermatitis   . Neurogenic bladder   . OSA (obstructive sleep apnea)     Patient Active Problem List   Diagnosis Date Noted  . Asthma with acute exacerbation 11/14/2016  . Lobar pneumonia ([place]) 11/14/2016  . Mood disorder ([place]) 01/18/2016  . Contusion of rib on left side 11/17/2015  . Presence of indwelling urinary catheter 08/20/2015  . Advanced directives, counseling/discussion 07/28/2014  . Routine general medical examination at a health care facility 04/09/2012  . History of prostate cancer 04/09/2012  . Venous stasis dermatitis 06/13/2011  . Chronic sinusitis   . CONSTIPATION, CHRONIC 12/07/2010  . Hyperlipemia 12/02/2010  . OSTEOARTHRITIS 12/02/2010  . NEUROGENIC BLADDER  05/24/2010  . IBS 05/05/2010  . PERSONAL HISTORY OF COLONIC POLYPS 05/05/2010  . NEUROPATHY 05/03/2010  . Essential hypertension, benign 03/25/2009  . GAD (generalized anxiety disorder) 01/27/2007  . Coronary atherosclerosis of native coronary artery 01/27/2007  . Allergic asthma 01/27/2007  . GERD 01/27/2007  . ERECTILE DYSFUNCTION, ORGANIC 01/27/2007  . Chronic rheumatic arthritis ([place]) 01/27/2007    Past Surgical History:  Procedure Laterality Date  . KNEE ARTHROPLASTY Right 09/02/2015   Procedure: COMPUTER ASSISTED TOTAL KNEE ARTHROPLASTY;  Surgeon: [name], MD;  Location: [facility] ORS;  Service: Orthopedics;  Laterality: Right;  . KNEE SURGERY Left    ARTHROSCOPY LEFT  . NASAL SINUS SURGERY  2009   DEVIATED SEPTUM AND POLYPS  . PROSTATE SURGERY     PROSTATECTOMY  . TOTAL KNEE ARTHROPLASTY Left 9/15   Dr [name]  . URETHRAL STRICTURE DILATATION  02-2010   Dr.Cope    Allergies Ciprofloxacin; Citalopram hydrobromide; Lorazepam; Paroxetine; Ramipril; and Simvastatin  Social History Social History  Substance Use Topics  . Smoking status: Never Smoker  . Smokeless tobacco: Never Used  . Alcohol use 6.0 oz/week    10 Standard drinks or equivalent per week    Review of Systems Constitutional: Positive for fever Cardiovascular: Negative for chest pain. Respiratory: Negative for shortness of breath. Positive for recent cough Gastrointestinal: Negative for abdominal pain, vomiting and diarrhea. Genitourinary: Negative for dysuria. Musculoskeletal: Negative for back pain. Skin: Negative for rash.  Neurological: Negative for headaches, focal weakness or numbness.  10-point ROS otherwise negative.  ____________________________________________   PHYSICAL EXAM:  VITAL SIGNS: ED Triage Vitals  Enc Vitals Group     BP 12/11/16 1456 (!) 107/54     Pulse Rate 12/11/16 1456 (!) 103     Resp 12/11/16 1456 16     Temp 12/11/16 1456 98.8 F (37.1 C)     Temp Source  12/11/16 1456 Oral     SpO2 12/11/16 1456 94 %     Weight 12/11/16 1457 218 lb (98.9 kg)     Height 12/11/16 1457 5\' 11"  (1.803 m)     Head Circumference --      Peak Flow --      Pain Score --      Pain Loc --      Pain Edu? --      Excl. in [place]? --     Constitutional: Alert and oriented. Well appearing and in no distress. Eyes: Conjunctivae are normal. PERRL. Normal extraocular movements. ENT   Head: Normocephalic and atraumatic.   Nose: No congestion/rhinnorhea.   Mouth/Throat: Mucous membranes are moist.   Neck: No stridor. Cardiovascular: Normal rate, regular rhythm. No murmurs, rubs, or gallops. Respiratory: Normal respiratory effort without tachypnea nor retractions. Breath sounds are clear and equal bilaterally. No wheezes/rales/rhonchi. Gastrointestinal: Soft and nontender. Normal bowel sounds Musculoskeletal: Nontender with normal range of motion in all extremities. No lower extremity tenderness nor edema. Neurologic:  Normal speech and language. No gross focal neurologic deficits are appreciated.  Skin:  Skin is warm, dry and intact. No rash noted. Psychiatric: Mood and affect are normal. Speech and behavior are normal.  ____________________________________________  ED COURSE:  Pertinent labs & imaging results that were available during my care of the patient were reviewed by me and considered in my medical decision making (see chart for details). Clinical Course   Patient is no distress, we will assess with labs and imaging.  Procedures ____________________________________________   LABS (pertinent positives/negatives)  Labs Reviewed  COMPREHENSIVE METABOLIC PANEL - Abnormal; Notable for the following:       Result Value   Sodium 132 (*)    Chloride 98 (*)    Glucose, Bld 148 (*)    Calcium 8.4 (*)    Total Protein 6.2 (*)    ALT 11 (*)    Total Bilirubin 1.3 (*)    All other components within normal limits  CBC WITH DIFFERENTIAL/PLATELET -  Abnormal; Notable for the following:    WBC 16.8 (*)    RBC 4.07 (*)    HCT 38.5 (*)    Platelets 139 (*)    Neutro Abs 15.3 (*)    Lymphs Abs 0.5 (*)    Basophils Absolute 0.2 (*)    All other components within normal limits  URINALYSIS, COMPLETE (UACMP) WITH MICROSCOPIC - Abnormal; Notable for the following:    Color, Urine AMBER (*)    APPearance CLOUDY (*)    Hgb urine dipstick MODERATE (*)    Protein, ur 100 (*)    Nitrite POSITIVE (*)    Leukocytes, UA LARGE (*)    Bacteria, UA RARE (*)    Squamous Epithelial / LPF 0-5 (*)    All other components within normal limits  INFLUENZA PANEL BY PCR (TYPE A & B, H1N1)    RADIOLOGY  Chest x-ray IMPRESSION: Bibasilar atelectasis LEFT greater than RIGHT.  Aortic atherosclerosis. IMPRESSION: 1. Partial consolidation in the left lower lobe may  reflect a mild pneumonia. Hazy densities in the posterior right lower lobe could reflect additional resolving infiltrate. 2. Mild asymmetric thickening of the right GE junction, consider endoscopy follow-up to exclude a mass 3. Asymmetric soft tissue thickening of the left larynx, could be further evaluated with direct visualization. 4. Slightly enlarged spleen 5. Possible mild hydronephrosis of visualized upper pole right kidney. ____________________________________________  FINAL ASSESSMENT AND PLAN  Fever, UTI, pneumonia  Plan: Patient with labs and imaging as dictated above. Patient has a chronic indwelling Foley catheter and evidence of UTI. We will send a urine culture. He has also failed outpatient treatment over the past several weeks for pneumonia. I will order IV antibiotics, discussed with the hospitalist for admission.   Earleen Newport, MD   Note: This dictation was prepared with Dragon dictation. Any transcriptional errors that result from this process are unintentional    Earleen Newport, MD 12/11/16 1921

## 2016-12-12 ENCOUNTER — Ambulatory Visit: Payer: Medicare PPO

## 2016-12-12 LAB — STREP PNEUMONIAE URINARY ANTIGEN: Strep Pneumo Urinary Antigen: NEGATIVE

## 2016-12-12 LAB — CBC
HEMATOCRIT: 35.4 % — AB (ref 40.0–52.0)
Hemoglobin: 12.6 g/dL — ABNORMAL LOW (ref 13.0–18.0)
MCH: 34.1 pg — AB (ref 26.0–34.0)
MCHC: 35.5 g/dL (ref 32.0–36.0)
MCV: 96.2 fL (ref 80.0–100.0)
PLATELETS: 104 10*3/uL — AB (ref 150–440)
RBC: 3.68 MIL/uL — ABNORMAL LOW (ref 4.40–5.90)
RDW: 14.2 % (ref 11.5–14.5)
WBC: 16.5 10*3/uL — ABNORMAL HIGH (ref 3.8–10.6)

## 2016-12-12 LAB — BASIC METABOLIC PANEL
Anion gap: 9 (ref 5–15)
BUN: 19 mg/dL (ref 6–20)
CHLORIDE: 95 mmol/L — AB (ref 101–111)
CO2: 28 mmol/L (ref 22–32)
CREATININE: 0.86 mg/dL (ref 0.61–1.24)
Calcium: 8.3 mg/dL — ABNORMAL LOW (ref 8.9–10.3)
GFR calc Af Amer: >60 mL/min (ref >60)
GFR calc non Af Amer: >60 mL/min (ref >60)
Glucose, Bld: 130 mg/dL — ABNORMAL HIGH (ref 65–99)
POTASSIUM: 3.7 mmol/L (ref 3.5–5.1)
SODIUM: 132 mmol/L — AB (ref 135–145)

## 2016-12-12 NOTE — Progress Notes (Signed)
Patient resting in the bed at this time, alert and oreinted, no distress noted, assisted to [facility] , modf calm ,family at bedside

## 2016-12-12 NOTE — Progress Notes (Signed)
[facility] [place] NAME: [name]    MR#:  [id number]  DATE OF BIRTH:  12-24-1933  SUBJECTIVE:  CHIEF COMPLAINT:  Patient is still febrile and coughing intermittently and confusion,  REVIEW OF SYSTEMS:  CONSTITUTIONAL: No fever, fatigue or weakness.  EYES: No blurred or double vision.  EARS, NOSE, AND THROAT: No tinnitus or ear pain.  RESPIRATORY: Reporting cough, exertional shortness of breath denies any wheezing or hemoptysis  CARDIOVASCULAR: No chest pain, orthopnea, edema.  GASTROINTESTINAL: No nausea, vomiting, diarrhea or abdominal pain.  GENITOURINARY: No dysuria, hematuria.  ENDOCRINE: No polyuria, nocturia,  HEMATOLOGY: No anemia, easy bruising or bleeding SKIN: No rash or lesion. MUSCULOSKELETAL: No joint pain or arthritis.   NEUROLOGIC: No tingling, numbness, weakness.  PSYCHIATRY: No anxiety or depression.   DRUG ALLERGIES:   Allergies  Allergen Reactions  . Ciprofloxacin Nausea And Vomiting    Headache  . Citalopram Hydrobromide Other (See Comments)    "unknown"  . Lorazepam     Adverse reaction  . Paroxetine Nausea Only  . Ramipril Other (See Comments)    "unknown"  . Simvastatin     VITALS:  Blood pressure (!) 113/51, pulse 99, temperature 98.2 F (36.8 C), temperature source Oral, resp. rate 20, height 5\' 11"  (1.803 m), weight 97.4 kg (214 lb 11.2 oz), SpO2 94 %.  PHYSICAL EXAMINATION:  GENERAL:  83 y.o.-year-old patient lying in the bed with no acute distress.  EYES: Pupils equal, round, reactive to light and accommodation. No scleral icterus. Extraocular muscles intact.  HEENT: Head atraumatic, normocephalic. Oropharynx and nasopharynx clear.  NECK:  Supple, no jugular venous distention. No thyroid enlargement, no tenderness.  LUNGS:  diminished breath  sounds bilaterally, no wheezing, rales,rhonchi or crepitation. No use of accessory muscles of respiration.  CARDIOVASCULAR: S1, S2 normal. No  murmurs, rubs, or gallops.  ABDOMEN: Soft, nontender, nondistended. Bowel sounds present. No organomegaly or mass.  EXTREMITIES: No pedal edema, cyanosis, or clubbing.  NEUROLOGIC: Cranial nerves II through XII are intact. Muscle strength 5/5 in all extremities. Sensation intact. Gait not checked.  PSYCHIATRIC: The patient is alert and oriented x 3.  SKIN: No obvious rash, lesion, or ulcer.    LABORATORY PANEL:   CBC  Recent Labs Lab 12/12/16 0315  WBC 16.5*  HGB 12.6*  HCT 35.4*  PLT 104*   ------------------------------------------------------------------------------------------------------------------  Chemistries   Recent Labs Lab 12/11/16 1506 12/12/16 0315  NA 132* 132*  K 4.3 3.7  CL 98* 95*  CO2 28 28  GLUCOSE 148* 130*  BUN 17 19  CREATININE 0.87 0.86  CALCIUM 8.4* 8.3*  AST 18  --   ALT 11*  --   ALKPHOS 52  --   BILITOT 1.3*  --    ------------------------------------------------------------------------------------------------------------------  Cardiac Enzymes No results for input(s): TROPONINI in the last 168 hours. ------------------------------------------------------------------------------------------------------------------  RADIOLOGY:  Dg Chest 2 View  Result Date: 12/11/2016 CLINICAL DATA:  Fever, had pneumonia in December 2017, fatigue, history asthma, coronary artery disease, hypertension, GERD, prostate cancer, irritable bowel syndrome EXAM: CHEST  2 VIEW COMPARISON:  12/06/2016 FINDINGS: Upper normal heart size. Atherosclerotic calcification aorta. Mediastinal contours and pulmonary vascularity normal. Azygos fissure noted. Emphysematous changes with bibasilar atelectasis greater on LEFT. Remaining lungs clear. No pleural effusion or pneumothorax. Bones demineralized with BILATERAL glenohumeral degenerative changes. IMPRESSION: Bibasilar atelectasis LEFT greater than RIGHT. Aortic atherosclerosis. Electronically Signed   By: Lavonia Dana M.D.    On: 12/11/2016 15:49  Ct Chest Wo Contrast  Result Date: 12/11/2016 CLINICAL DATA:  Persistent cough EXAM: CT CHEST WITHOUT CONTRAST TECHNIQUE: Multidetector CT imaging of the chest was performed following the standard protocol without IV contrast. COMPARISON:  Chest x-ray 12/11/2016, CT chest 07/11/2011 FINDINGS: Cardiovascular: Limited evaluation without intravenous contrast. Atherosclerotic vascular calcifications within the aorta and great vessels. No aneurysmal dilatation. Coronary artery calcifications are present. Heart size upper normal. Trace pericardial fluid. Mediastinum/Nodes: Nonspecific subcentimeter lymph nodes in the mediastinum. Thyroid within normal limits. Trachea is midline. Asymmetric right esophageal thickening at the GE junction. There is mild asymmetric thickening of the left laryngeal tissues. Lungs/Pleura: Right azygos lobe. No significant pleural effusion. Stable calcified granuloma in the lingula. Hazy density in the posterior right lower lobe may reflect resolving infiltrate. Partial consolidation in the posterior left lower lobe. Upper Abdomen: Spleen slightly enlarged at 13 cm. Possible mild hydronephrosis upper pole right kidney. Musculoskeletal: Degenerative changes. No acute or suspicious bone lesions IMPRESSION: 1. Partial consolidation in the left lower lobe may reflect a mild pneumonia. Hazy densities in the posterior right lower lobe could reflect additional resolving infiltrate. 2. Mild asymmetric thickening of the right GE junction, consider endoscopy follow-up to exclude a mass 3. Asymmetric soft tissue thickening of the left larynx, could be further evaluated with direct visualization. 4. Slightly enlarged spleen 5. Possible mild hydronephrosis of visualized upper pole right kidney. Electronically Signed   By: Donavan Foil M.D.   On: 12/11/2016 19:06    EKG:   Orders placed or performed during the hospital encounter of 08/26/15  . EKG 12-Lead  . EKG 12-Lead     ASSESSMENT AND PLAN:  Patient comes to the ED with the fevers and cough  #Pneumonia He is treated with vancomycin and Zosyn in the ED.  I will continue Zithromax and Rocephin to cover CPAP and UTI  follow-up CBC and cultures.  DuoNeb when necessary. Robitussin when necessary.   #UTI  Urine culture and sensitivities pending. Provide IV Rocephin meanwhile  #,Thrombocytopenia  monitor platelet count closely. If platelet count is less than 100,000 will discontinue    #Hyponatremia.sodium 132 Continue  normal saline IV and follow-up BMP.   #Hypertension. Continue home hypertension medication but hold Lasix due to hyponatremia     All the records are reviewed and case discussed with Care Management/Social Workerr. Management plans discussed with the patient, family and they are in agreement.  CODE STATUS: fc   TOTAL TIME TAKING CARE OF THIS PATIENT: 36  minutes.   POSSIBLE D/C IN 2 DAYS, DEPENDING ON CLINICAL CONDITION.  Note: This dictation was prepared with Dragon dictation along with smaller phrase technology. Any transcriptional errors that result from this process are unintentional.   Nicholes Mango M.D on 12/12/2016 at 5:03 PM  Between 7am to 6pm - Pager - [phone] After 6pm go to [email or web address] - password [email or web address] [facility]  Office  [phone]  CC: Primary care physician; Viviana Simpler, MD

## 2016-12-13 LAB — BASIC METABOLIC PANEL
Anion gap: 6 (ref 5–15)
BUN: 21 mg/dL — AB (ref 6–20)
CO2: 28 mmol/L (ref 22–32)
Calcium: 7.6 mg/dL — ABNORMAL LOW (ref 8.9–10.3)
Chloride: 95 mmol/L — ABNORMAL LOW (ref 101–111)
Creatinine, Ser: 1 mg/dL (ref 0.61–1.24)
Glucose, Bld: 112 mg/dL — ABNORMAL HIGH (ref 65–99)
Potassium: 3.9 mmol/L (ref 3.5–5.1)
SODIUM: 129 mmol/L — AB (ref 135–145)

## 2016-12-13 LAB — CBC
HEMATOCRIT: 32.4 % — AB (ref 40.0–52.0)
Hemoglobin: 11.4 g/dL — ABNORMAL LOW (ref 13.0–18.0)
MCH: 33.2 pg (ref 26.0–34.0)
MCHC: 35 g/dL (ref 32.0–36.0)
MCV: 94.8 fL (ref 80.0–100.0)
PLATELETS: 107 10*3/uL — AB (ref 150–440)
RBC: 3.42 MIL/uL — ABNORMAL LOW (ref 4.40–5.90)
RDW: 14.1 % (ref 11.5–14.5)
WBC: 12.7 10*3/uL — AB (ref 3.8–10.6)

## 2016-12-13 MED ORDER — FUROSEMIDE 20 MG PO TABS
20.0000 mg | ORAL_TABLET | Freq: Every day | ORAL | Status: DC
  Administered 2016-12-13 – 2016-12-15 (×3): 20 mg via ORAL
  Filled 2016-12-13 (×3): qty 1

## 2016-12-13 NOTE — Progress Notes (Signed)
[facility]  ROUNDING NOTE   Subjective:   [name] admitted to [facility] on 12/11/2016 for Extrinsic asthma, mild intermittent, uncomplicated [id number] Urinary tract infection associated with indwelling urethral catheter, initial encounter ([place]) [id number], N39.0] Community acquired pneumonia, unspecified laterality [J18.9]  Nephrology consulted for SIADH and chronic hyponatremia.   Objective:  Vital signs in last 24 hours:  Temp:  [98.2 F (36.8 C)-100.5 F (38.1 C)] 100.5 F (38.1 C) (01/16 0742) Pulse Rate:  [85-108] 95 (01/16 0742) Resp:  [18-20] 18 (01/16 0742) BP: (90-113)/(41-51) 100/42 (01/16 0742) SpO2:  [91 %-94 %] 94 % (01/16 0742)  Weight change:  Filed Weights   12/11/16 1457 12/11/16 2142  Weight: 98.9 kg (218 lb) 97.4 kg (214 lb 11.2 oz)    Intake/Output: I/O last 3 completed shifts: In: 2287.5 [P.O.:480; I.V.:1507.5; IV Piggyback:300] Out: 1275 [Urine:1275]   Intake/Output this shift:  No intake/output data recorded.  Physical Exam: General: NAD,   Head: Normocephalic, atraumatic. Dry oral mucosal membranes  Eyes: Anicteric, PERRL  Neck: Supple, trachea midline  Lungs:  Clear to auscultation  Heart: Regular rate and rhythm  Abdomen:  Soft, nontender,   Extremities:  no peripheral edema.  Neurologic: Nonfocal, moving all four extremities  Skin: No lesions       Basic Metabolic Panel:  Recent Labs Lab 12/11/16 1506 12/12/16 0315 12/13/16 0607  NA 132* 132* 129*  K 4.3 3.7 3.9  CL 98* 95* 95*  CO2 28 28 28   GLUCOSE 148* 130* 112*  BUN 17 19 21*  CREATININE 0.87 0.86 1.00  CALCIUM 8.4* 8.3* 7.6*    Liver Function Tests:  Recent Labs Lab 12/11/16 1506  AST 18  ALT 11*  ALKPHOS 52  BILITOT 1.3*  PROT 6.2*  ALBUMIN 3.5   No results for input(s): LIPASE, AMYLASE in the last 168 hours. No results for input(s): AMMONIA in the last 168 hours.  CBC:  Recent Labs Lab 12/11/16 1506 12/12/16 0315 12/13/16 0607   WBC 16.8* 16.5* 12.7*  NEUTROABS 15.3*  --   --   HGB 13.6 12.6* 11.4*  HCT 38.5* 35.4* 32.4*  MCV 94.8 96.2 94.8  PLT 139* 104* 107*    Cardiac Enzymes: No results for input(s): CKTOTAL, CKMB, CKMBINDEX, TROPONINI in the last 168 hours.  BNP: Invalid input(s): POCBNP  CBG: No results for input(s): GLUCAP in the last 168 hours.  Microbiology: Results for orders placed or performed during the hospital encounter of 12/11/16  Urine culture     Status: Abnormal (Preliminary result)   Collection Time: 12/11/16  6:20 PM  Result Value Ref Range Status   Specimen Description URINE, CATHETERIZED  Final   Special Requests Normal  Final   Culture >=100,000 COLONIES/mL GRAM NEGATIVE RODS (A)  Final   Report Status PENDING  Incomplete  Culture, blood (routine x 2) Call MD if unable to obtain prior to antibiotics being given     Status: None (Preliminary result)   Collection Time: 12/11/16  9:06 PM  Result Value Ref Range Status   Specimen Description BLOOD LEFT ASSIST CONTROL  Final   Special Requests   Final    BOTTLES DRAWN AEROBIC AND ANAEROBIC 9MLAERO, 7MLANAERO   Culture NO GROWTH 2 DAYS  Final   Report Status PENDING  Incomplete  Culture, blood (routine x 2) Call MD if unable to obtain prior to antibiotics being given     Status: None (Preliminary result)   Collection Time: 12/11/16  9:06 PM  Result  Value Ref Range Status   Specimen Description BLOOD RIGHT ASSIST CONTROL  Final   Special Requests   Final    BOTTLES DRAWN AEROBIC AND ANAEROBIC AERO9ML,ANAERO9ML   Culture NO GROWTH 2 DAYS  Final   Report Status PENDING  Incomplete    Coagulation Studies: No results for input(s): LABPROT, INR in the last 72 hours.  Urinalysis:  Recent Labs  12/11/16 [place]*  LABSPEC 1.019  PHURINE 5.0  GLUCOSEU NEGATIVE  HGBUR MODERATE*  BILIRUBINUR NEGATIVE  KETONESUR NEGATIVE  PROTEINUR 100*  NITRITE POSITIVE*  LEUKOCYTESUR LARGE*      Imaging: Dg Chest 2  View  Result Date: 12/11/2016 CLINICAL DATA:  Fever, had pneumonia in December 2017, fatigue, history asthma, coronary artery disease, hypertension, GERD, prostate cancer, irritable bowel syndrome EXAM: CHEST  2 VIEW COMPARISON:  12/06/2016 FINDINGS: Upper normal heart size. Atherosclerotic calcification aorta. Mediastinal contours and pulmonary vascularity normal. Azygos fissure noted. Emphysematous changes with bibasilar atelectasis greater on LEFT. Remaining lungs clear. No pleural effusion or pneumothorax. Bones demineralized with BILATERAL glenohumeral degenerative changes. IMPRESSION: Bibasilar atelectasis LEFT greater than RIGHT. Aortic atherosclerosis. Electronically Signed   By: Lavonia Dana M.D.   On: 12/11/2016 15:49   Ct Chest Wo Contrast  Result Date: 12/11/2016 CLINICAL DATA:  Persistent cough EXAM: CT CHEST WITHOUT CONTRAST TECHNIQUE: Multidetector CT imaging of the chest was performed following the standard protocol without IV contrast. COMPARISON:  Chest x-ray 12/11/2016, CT chest 07/11/2011 FINDINGS: Cardiovascular: Limited evaluation without intravenous contrast. Atherosclerotic vascular calcifications within the aorta and great vessels. No aneurysmal dilatation. Coronary artery calcifications are present. Heart size upper normal. Trace pericardial fluid. Mediastinum/Nodes: Nonspecific subcentimeter lymph nodes in the mediastinum. Thyroid within normal limits. Trachea is midline. Asymmetric right esophageal thickening at the GE junction. There is mild asymmetric thickening of the left laryngeal tissues. Lungs/Pleura: Right azygos lobe. No significant pleural effusion. Stable calcified granuloma in the lingula. Hazy density in the posterior right lower lobe may reflect resolving infiltrate. Partial consolidation in the posterior left lower lobe. Upper Abdomen: Spleen slightly enlarged at 13 cm. Possible mild hydronephrosis upper pole right kidney. Musculoskeletal: Degenerative changes. No  acute or suspicious bone lesions IMPRESSION: 1. Partial consolidation in the left lower lobe may reflect a mild pneumonia. Hazy densities in the posterior right lower lobe could reflect additional resolving infiltrate. 2. Mild asymmetric thickening of the right GE junction, consider endoscopy follow-up to exclude a mass 3. Asymmetric soft tissue thickening of the left larynx, could be further evaluated with direct visualization. 4. Slightly enlarged spleen 5. Possible mild hydronephrosis of visualized upper pole right kidney. Electronically Signed   By: Donavan Foil M.D.   On: 12/11/2016 19:06     Medications:    . acidophilus  1 capsule Oral Daily  . aspirin EC  81 mg Oral Daily  . azithromycin  500 mg Intravenous Q24H  . cefTRIAXone  1 g Intravenous Q24H  . cholecalciferol  5,000 Units Oral Daily  . darifenacin  7.5 mg Oral Daily  . enoxaparin (LOVENOX) injection  40 mg Subcutaneous Q24H  . fluticasone  2 spray Each Nare BID  . folic acid  [id number] mcg Oral Daily  . furosemide  20 mg Oral Daily  . gabapentin  300 mg Oral TID  . hydroxychloroquine  200 mg Oral BID  . metoprolol succinate  50 mg Oral Daily  . pantoprazole  40 mg Oral Daily  . polyethylene glycol  34 g Oral Daily  . pravastatin  20 mg Oral q1800  . vitamin B-12  500 mcg Oral Daily   acetaminophen **OR** acetaminophen, albuterol, ALPRAZolam, bisacodyl, guaiFENesin, ondansetron **OR** ondansetron (ZOFRAN) IV, senna-docusate, traMADol  Assessment/ Plan:  [name] is a 83 y.o. white male with COPD/asthma, hypertension, prostate cancer, hyperlipidemia, inflammatory arthritis  1. Chronic hyponatremia: secondary to SIADH - restart fluid restriction - restart furosemide.  - monitor volume status - monitor serum sodium, goal 130-135.    LOS: 2 Daishia Fetterly 1/16/201812:10 PM

## 2016-12-13 NOTE — Progress Notes (Signed)
Patient up to bsc with 1 assist and walker, tolerated well

## 2016-12-13 NOTE — Progress Notes (Signed)
PT Cancellation Note  Patient Details Name: [name] MRN: [id number] DOB: [birth year]   Cancelled Treatment:    Reason Eval/Treat Not Completed: Patient's level of consciousness Attempted to see pt this AM, he was sleeping soundly and despite numerous attempts to wake him he was never able to keep his eyes open for more than a few seconds at a time. Will try back later as time allows and pt is appropriate.   Kreg Shropshire, DPT 12/13/2016, 11:36 AM

## 2016-12-13 NOTE — Progress Notes (Signed)
[place] [facility] [place] NAME: [name]    MR#:  [id number]  DATE OF BIRTH:  28-Jul-1934  SUBJECTIVE:  CHIEF COMPLAINT:  Patient is still febrile and coughing intermittently and sleepy but arousable  REVIEW OF SYSTEMS:  CONSTITUTIONAL: No fever, fatigue or weakness.  EYES: No blurred or double vision.  EARS, NOSE, AND THROAT: No tinnitus or ear pain.  RESPIRATORY: Reporting cough, exertional shortness of breath denies any wheezing or hemoptysis  CARDIOVASCULAR: No chest pain, orthopnea, edema.  GASTROINTESTINAL: No nausea, vomiting, diarrhea or abdominal pain.  GENITOURINARY: No dysuria, hematuria.  ENDOCRINE: No polyuria, nocturia,  HEMATOLOGY: No anemia, easy bruising or bleeding SKIN: No rash or lesion. MUSCULOSKELETAL: No joint pain or arthritis.   NEUROLOGIC: No tingling, numbness, weakness.  PSYCHIATRY: No anxiety or depression.   DRUG ALLERGIES:   Allergies  Allergen Reactions  . Ciprofloxacin Nausea And Vomiting    Headache  . Citalopram Hydrobromide Other (See Comments)    "unknown"  . Lorazepam     Adverse reaction  . Paroxetine Nausea Only  . Ramipril Other (See Comments)    "unknown"  . Simvastatin     VITALS:  Blood pressure 102/66, pulse 79, temperature (!) 100.5 F (38.1 C), temperature source Oral, resp. rate 18, height 5\' 11"  (1.803 m), weight 97.4 kg (214 lb 11.2 oz), SpO2 93 %.  PHYSICAL EXAMINATION:  GENERAL:  83 y.o.-year-old patient lying in the bed with no acute distress.  EYES: Pupils equal, round, reactive to light and accommodation. No scleral icterus. Extraocular muscles intact.  HEENT: Head atraumatic, normocephalic. Oropharynx and nasopharynx clear.  NECK:  Supple, no jugular venous distention. No thyroid enlargement, no tenderness.  LUNGS:  diminished breath  sounds bilaterally, no wheezing, rales,rhonchi or crepitation. No use of accessory muscles of respiration.  CARDIOVASCULAR: S1, S2  normal. No murmurs, rubs, or gallops.  ABDOMEN: Soft, nontender, nondistended. Bowel sounds present. No organomegaly or mass.  EXTREMITIES: No pedal edema, cyanosis, or clubbing.  NEUROLOGIC: Cranial nerves II through XII are intact. Muscle strength 5/5 in all extremities. Sensation intact. Gait not checked.  PSYCHIATRIC: The patient is alert and oriented x 3.  SKIN: No obvious rash, lesion, or ulcer.    LABORATORY PANEL:   CBC  Recent Labs Lab 12/13/16 0607  WBC 12.7*  HGB 11.4*  HCT 32.4*  PLT 107*   ------------------------------------------------------------------------------------------------------------------  Chemistries   Recent Labs Lab 12/11/16 1506  12/13/16 0607  NA 132*  < > 129*  K 4.3  < > 3.9  CL 98*  < > 95*  CO2 28  < > 28  GLUCOSE 148*  < > 112*  BUN 17  < > 21*  CREATININE 0.87  < > 1.00  CALCIUM 8.4*  < > 7.6*  AST 18  --   --   ALT 11*  --   --   ALKPHOS 52  --   --   BILITOT 1.3*  --   --   < > = values in this interval not displayed. ------------------------------------------------------------------------------------------------------------------  Cardiac Enzymes No results for input(s): TROPONINI in the last 168 hours. ------------------------------------------------------------------------------------------------------------------  RADIOLOGY:  No results found.  EKG:   Orders placed or performed during the hospital encounter of 08/26/15  . EKG 12-Lead  . EKG 12-Lead    ASSESSMENT AND PLAN:  Patient comes to the ED with the fevers and cough  #Pneumonia He is treated with vancomycin and Zosyn in the ED.  I  will continue Zithromax and Rocephin to cover CPAP and UTI  follow-up CBC  Blood cx neg Urine cx klebsiella , sensitivity pending DuoNeb when necessary. Robitussin when necessary.   #UTI  Urine culture klebsiella and sensitivity pending Provide IV Rocephin meanwhile  #,Thrombocytopenia  monitor platelet count closely.  If platelet count is less than 100,000 will discontinue    #Hyponatremia. 2/2 SIADH  sodium 132- 129  FLUID RESTRICTION Resumed lasix  follow-up BMP.   #Hypertension. Continue home hypertension medication  Resume lasix  # Day time sleepiness ? OSA OP sleep study Nocturnal bipap/cpap  PT   All the records are reviewed and case discussed with Care Management/Social Workerr. Management plans discussed with the patient, family and they are in agreement.  CODE STATUS: fc   TOTAL TIME TAKING CARE OF THIS PATIENT: 36  minutes.   POSSIBLE D/C IN 2 DAYS, DEPENDING ON CLINICAL CONDITION.  Note: This dictation was prepared with Dragon dictation along with smaller phrase technology. Any transcriptional errors that result from this process are unintentional.   Nicholes Mango M.D on 12/13/2016 at 8:46 PM  Between 7am to 6pm - Pager - [phone] After 6pm go to [email or web address] - password [email or web address] [facility]  Office  [phone]  CC: Primary care physician; Viviana Simpler, MD

## 2016-12-13 NOTE — Evaluation (Signed)
Physical Therapy Evaluation Patient Details Name: [name] MRN: [id number] DOB: [birth year] Today's Date: 12/13/2016   History of Present Illness  83 y.o. male with recent pneumonia,  and history hypertension, hyponatremia, CAD and processes cancer. The patient was diagnosed with pneumonia ~1 month ago and treated with 10 days of Levaquin. He is on chronic Foley catheter, now returns with weakness and continued breathing difficulty.  Admitted with PNA.  Clinical Impression  Pt was slow but consistent with ambulation and was able to fully circumambulate the nurses' station on room air and w/o safety concerns.  He was slower than his baseline and highly reliant on the walker but ultimately did well.  His O2 remained in the 90s t/o the effort, though he did have considerable fatigue and is "still not feel that great."  Overall pt should be able to go home but is not at his baseline and would benefit from HHPT once medically ready to discharge.     Follow Up Recommendations Home health PT    Equipment Recommendations       Recommendations for Other Services       Precautions / Restrictions Precautions Precautions: Fall Restrictions Weight Bearing Restrictions: No      Mobility  Bed Mobility               General bed mobility comments: not tested pt in recliner on arrival  Transfers Overall transfer level: Modified independent Equipment used: Rolling walker (2 wheeled)             General transfer comment: Pt needing a lot of time to get up to standing and generally needed a lot of cuing for positioning, set up, sequencing - pt did rise w/o direct assist on both attempts  Ambulation/Gait Ambulation/Gait assistance: Min guard Ambulation Distance (Feet): 200 Feet Assistive device: Rolling walker (2 wheeled)       General Gait Details: Pt with slow, but consistent ambualtion.  He leaned heavily on walker and needed frequent reminders to stand taller/lean less on the  walker.  He is overall safe but relatively consistent.   Stairs            Wheelchair Mobility    Modified Rankin (Stroke Patients Only)       Balance Overall balance assessment: Modified Independent (needs AD to maintain standing balance)                                           Pertinent Vitals/Pain Pain Assessment: No/denies pain    Home Living Family/patient expects to be discharged to:: Private residence Living Arrangements: Spouse/significant other   Type of Home: House Home Access: Stairs to enter   [employer] of Steps: 4   Home Equipment: Environmental consultant - 2 wheels;Cane - single point      Prior Function Level of Independence: Independent with assistive device(s)         Comments: Apparently he tries to get out of the house daily, relatively active.     Hand Dominance        Extremity/Trunk Assessment   Upper Extremity Assessment Upper Extremity Assessment: Generalized weakness (unable to raise either shoulder >~50 degrees)    Lower Extremity Assessment Lower Extremity Assessment: Generalized weakness (grossly 4-/5 t/o )       Communication   Communication: No difficulties  Cognition Arousal/Alertness: Awake/alert Behavior During Therapy: WFL for tasks assessed/performed  Overall Cognitive Status: Within Functional Limits for tasks assessed                      General Comments      Exercises     Assessment/Plan    PT Assessment Patient needs continued PT services  PT Problem List Decreased strength;Decreased activity tolerance;Decreased balance;Decreased mobility;Decreased safety awareness;Decreased knowledge of use of DME;Decreased range of motion          PT Treatment Interventions Gait training;DME instruction;Stair training;Functional mobility training;Therapeutic activities;Therapeutic exercise;Balance training;Patient/family education    PT Goals (Current goals can be found in the Care Plan  section)  Acute Rehab PT Goals Patient Stated Goal: go home PT Goal Formulation: With patient Time For Goal Achievement: 12/27/16 Potential to Achieve Goals: Fair    Frequency Min 2X/week   Barriers to discharge        Co-evaluation               End of Session Equipment Utilized During Treatment: Gait belt Activity Tolerance: Patient tolerated treatment well;Patient limited by fatigue Patient left: with chair alarm set;with call bell/phone within reach Nurse Communication: Mobility status         Time: [id number] PT Time Calculation (min) (ACUTE ONLY): 23 min   Charges:   PT Evaluation $PT Eval Low Complexity: 1 Procedure     PT G CodesKreg Shropshire, DPT 12/13/2016, 5:41 PM

## 2016-12-14 ENCOUNTER — Inpatient Hospital Stay: Payer: Medicare PPO

## 2016-12-14 ENCOUNTER — Ambulatory Visit: Payer: Medicare PPO | Admitting: Internal Medicine

## 2016-12-14 LAB — BASIC METABOLIC PANEL
ANION GAP: 5 (ref 5–15)
BUN: 15 mg/dL (ref 6–20)
CHLORIDE: 98 mmol/L — AB (ref 101–111)
CO2: 28 mmol/L (ref 22–32)
Calcium: 7.9 mg/dL — ABNORMAL LOW (ref 8.9–10.3)
Creatinine, Ser: 0.65 mg/dL (ref 0.61–1.24)
GFR calc Af Amer: >60 mL/min (ref >60)
GLUCOSE: 117 mg/dL — AB (ref 65–99)
Potassium: 3.6 mmol/L (ref 3.5–5.1)
Sodium: 131 mmol/L — ABNORMAL LOW (ref 135–145)

## 2016-12-14 LAB — URINE CULTURE
Culture: >=100000 — AB
Special Requests: NORMAL

## 2016-12-14 MED ORDER — AMOXICILLIN-POT CLAVULANATE 875-125 MG PO TABS
1.0000 | ORAL_TABLET | Freq: Two times a day (BID) | ORAL | Status: DC
  Administered 2016-12-14 – 2016-12-15 (×2): 1 via ORAL
  Filled 2016-12-14 (×2): qty 1

## 2016-12-14 MED ORDER — METOPROLOL SUCCINATE ER 25 MG PO TB24
25.0000 mg | ORAL_TABLET | Freq: Every day | ORAL | Status: DC
  Administered 2016-12-14 – 2016-12-15 (×2): 25 mg via ORAL
  Filled 2016-12-14: qty 1

## 2016-12-14 NOTE — Progress Notes (Signed)
PT Cancellation Note  Patient Details Name: ISAHI SCIACCA MRN: [id number] DOB: October 31, 1934   Cancelled Treatment:    Reason Eval/Treat Not Completed: Fatigue/lethargy limiting ability to participate   Attempted session this am.  Lethargic and falling asleep while talking.  RN in and stated that he was lethargic yesterday am also.  May do better with afternoon sessions.  Will continue as appropriate.   Chesley Noon 12/14/2016, 10:25 AM

## 2016-12-14 NOTE — Progress Notes (Signed)
[facility]  ROUNDING NOTE   Subjective:   Na 131 (129)  Objective:  Vital signs in last 24 hours:  Temp:  [98.4 F (36.9 C)-100.5 F (38.1 C)] 98.4 F (36.9 C) (01/17 0751) Pulse Rate:  [71-79] 73 (01/17 0751) Resp:  [18-19] 18 (01/17 0751) BP: (102-115)/(43-66) 108/43 (01/17 0751) SpO2:  [93 %-97 %] 94 % (01/17 0751)  Weight change:  Filed Weights   12/11/16 1457 12/11/16 2142  Weight: 98.9 kg (218 lb) 97.4 kg (214 lb 11.2 oz)    Intake/Output: I/O last 3 completed shifts: In: [id number] [P.O.:1560; I.V.:1250; IV Piggyback:600] Out: 2050 [Urine:2050]   Intake/Output this shift:  Total I/O In: 240 [P.O.:240] Out: -   Physical Exam: General: NAD,   Head: Normocephalic, atraumatic. Dry oral mucosal membranes  Eyes: Anicteric, PERRL  Neck: Supple, trachea midline  Lungs:  Clear to auscultation  Heart: Regular rate and rhythm  Abdomen:  Soft, nontender,   Extremities:  no peripheral edema.  Neurologic: Nonfocal, moving all four extremities  Skin: No lesions       Basic Metabolic Panel:  Recent Labs Lab 12/11/16 1506 12/12/16 0315 12/13/16 0607 12/14/16 0645  NA 132* 132* 129* 131*  K 4.3 3.7 3.9 3.6  CL 98* 95* 95* 98*  CO2 28 28 28 28   GLUCOSE 148* 130* 112* 117*  BUN 17 19 21* 15  CREATININE 0.87 0.86 1.00 0.65  CALCIUM 8.4* 8.3* 7.6* 7.9*    Liver Function Tests:  Recent Labs Lab 12/11/16 1506  AST 18  ALT 11*  ALKPHOS 52  BILITOT 1.3*  PROT 6.2*  ALBUMIN 3.5   No results for input(s): LIPASE, AMYLASE in the last 168 hours. No results for input(s): AMMONIA in the last 168 hours.  CBC:  Recent Labs Lab 12/11/16 1506 12/12/16 0315 12/13/16 0607  WBC 16.8* 16.5* 12.7*  NEUTROABS 15.3*  --   --   HGB 13.6 12.6* 11.4*  HCT 38.5* 35.4* 32.4*  MCV 94.8 96.2 94.8  PLT 139* 104* 107*    Cardiac Enzymes: No results for input(s): CKTOTAL, CKMB, CKMBINDEX, TROPONINI in the last 168 hours.  BNP: Invalid input(s):  POCBNP  CBG: No results for input(s): GLUCAP in the last 168 hours.  Microbiology: Results for orders placed or performed during the hospital encounter of 12/11/16  Urine culture     Status: Abnormal   Collection Time: 12/11/16  6:20 PM  Result Value Ref Range Status   Specimen Description URINE, CATHETERIZED  Final   Special Requests Normal  Final   Culture >=100,000 COLONIES/mL KLEBSIELLA PNEUMONIAE (A)  Final   Report Status 12/14/2016 FINAL  Final   Organism ID, Bacteria KLEBSIELLA PNEUMONIAE (A)  Final      Susceptibility   Klebsiella pneumoniae - MIC*    AMPICILLIN >=32 RESISTANT Resistant     CEFAZOLIN <=4 SENSITIVE Sensitive     CEFTRIAXONE <=1 SENSITIVE Sensitive     CIPROFLOXACIN 0.5 SENSITIVE Sensitive     GENTAMICIN <=1 SENSITIVE Sensitive     IMIPENEM <=0.25 SENSITIVE Sensitive     NITROFURANTOIN 256 RESISTANT Resistant     TRIMETH/SULFA 40 SENSITIVE Sensitive     AMPICILLIN/SULBACTAM 8 SENSITIVE Sensitive     PIP/TAZO 16 SENSITIVE Sensitive     Extended ESBL NEGATIVE Sensitive     * >=100,000 COLONIES/mL KLEBSIELLA PNEUMONIAE  Culture, blood (routine x 2) Call MD if unable to obtain prior to antibiotics being given     Status: None (Preliminary result)   Collection  Time: 12/11/16  9:06 PM  Result Value Ref Range Status   Specimen Description BLOOD LEFT ASSIST CONTROL  Final   Special Requests   Final    BOTTLES DRAWN AEROBIC AND ANAEROBIC 9MLAERO, 7MLANAERO   Culture NO GROWTH 3 DAYS  Final   Report Status PENDING  Incomplete  Culture, blood (routine x 2) Call MD if unable to obtain prior to antibiotics being given     Status: None (Preliminary result)   Collection Time: 12/11/16  9:06 PM  Result Value Ref Range Status   Specimen Description BLOOD RIGHT ASSIST CONTROL  Final   Special Requests   Final    BOTTLES DRAWN AEROBIC AND ANAEROBIC AERO9ML,ANAERO9ML   Culture NO GROWTH 3 DAYS  Final   Report Status PENDING  Incomplete    Coagulation Studies: No  results for input(s): LABPROT, INR in the last 72 hours.  Urinalysis:  Recent Labs  12/11/16 1820  COLORURINE AMBER*  LABSPEC 1.019  PHURINE 5.0  GLUCOSEU NEGATIVE  HGBUR MODERATE*  BILIRUBINUR NEGATIVE  KETONESUR NEGATIVE  PROTEINUR 100*  NITRITE POSITIVE*  LEUKOCYTESUR LARGE*      Imaging: No results found.   Medications:    . acidophilus  1 capsule Oral Daily  . aspirin EC  81 mg Oral Daily  . azithromycin  500 mg Intravenous Q24H  . cefTRIAXone  1 g Intravenous Q24H  . cholecalciferol  5,000 Units Oral Daily  . darifenacin  7.5 mg Oral Daily  . enoxaparin (LOVENOX) injection  40 mg Subcutaneous Q24H  . fluticasone  2 spray Each Nare BID  . folic acid  [id number] mcg Oral Daily  . furosemide  20 mg Oral Daily  . gabapentin  300 mg Oral TID  . hydroxychloroquine  200 mg Oral BID  . metoprolol succinate  50 mg Oral Daily  . pantoprazole  40 mg Oral Daily  . polyethylene glycol  34 g Oral Daily  . pravastatin  20 mg Oral q1800  . vitamin B-12  500 mcg Oral Daily   acetaminophen **OR** acetaminophen, albuterol, ALPRAZolam, bisacodyl, guaiFENesin, ondansetron **OR** ondansetron (ZOFRAN) IV, senna-docusate, traMADol  Assessment/ Plan:  [name] is a 83 y.o. white male with COPD/asthma, hypertension, prostate cancer, hyperlipidemia, inflammatory arthritis  1. Chronic hyponatremia: secondary to SIADH - restarted fluid restriction - restarted furosemide.  - monitor volume status - monitor serum sodium, now at goal 130-135.    LOS: 3 Isyss Espinal 1/17/201810:31 AM

## 2016-12-14 NOTE — Care Management Note (Signed)
Case Management Note  Patient Details  Name: [name] MRN: [id number] Date of Birth: [birth year]  Subjective/Objective:                  Rounded on patient and he was sleeping. List of home health agencies and my contact number left at bedside. I contacted patient's wife. She is aware of list and wishes to talk to him before making a decision. His PCP is Dr. Lelon Huh.He uses a  2-wheeled walker to ambulate. Lives with wife. Able to afford medications [place].  Action/Plan: RNCM will continue to follow patient for home health needs.   Expected Discharge Date:                  Expected Discharge Plan:     In-House Referral:     Discharge planning Services  CM Consult  Post Acute Care Choice:  Durable Medical Equipment, Home Health Choice offered to:  Spouse (patient was sleeping)  DME Arranged:    DME Agency:     HH Arranged:  PT, Disease Management, RN [place] Agency:     Status of Service:  In process, will continue to follow  If discussed at Long Length of Stay Meetings, dates discussed:    Additional Comments:  Shaan Gayhart, RN 12/14/2016, 8:12 AM

## 2016-12-14 NOTE — Progress Notes (Signed)
Physical Therapy Treatment Patient Details Name: [name] MRN: [id number] DOB: [birth year] Today's Date: 01-03-17    History of Present Illness 83 y.o. male with recent pneumonia,  and history hypertension, hyponatremia, CAD and processes cancer. The patient was diagnosed with pneumonia ~1 month ago and treated with 10 days of Levaquin. He is on chronic Foley catheter, now returns with weakness and continued breathing difficulty.  Admitted with PNA.    PT Comments    Lethargic this am but woke later requesting gait.  OOB with min guard and rail. Stood x 5 minutes with rw while talking with MD without LOB.  Continued to amb to bathroom with min guard.  He was them able to ambulate around unit x 2 with no LOB and generally steady gait.  Some fatigue noted but overall did well.   Follow Up Recommendations  Home health PT     Equipment Recommendations       Recommendations for Other Services       Precautions / Restrictions Precautions Precautions: Fall Restrictions Weight Bearing Restrictions: No    Mobility  Bed Mobility Overal bed mobility: Needs Assistance                Transfers Overall transfer level: Needs assistance Equipment used: Rolling walker (2 wheeled) Transfers: Sit to/from Stand Sit to Stand: Min guard            Ambulation/Gait Ambulation/Gait assistance: Min guard Ambulation Distance (Feet): 350 Feet Assistive device: Rolling walker (2 wheeled) Gait Pattern/deviations: Step-through pattern     General Gait Details: slow but steady.   Stairs            Wheelchair Mobility    Modified Rankin (Stroke Patients Only)       Balance Overall balance assessment: Modified Independent                                  Cognition Arousal/Alertness: Awake/alert Behavior During Therapy: WFL for tasks assessed/performed Overall Cognitive Status: Within Functional Limits for tasks assessed                      Exercises Other Exercises Other Exercises: stood x 5 minutes while talking with MD Other Exercises: to bathroom with min guard    General Comments        Pertinent Vitals/Pain Pain Assessment: No/denies pain    Home Living                      Prior Function            PT Goals (current goals can now be found in the care plan section) Progress towards PT goals: Progressing toward goals    Frequency    Min 2X/week      PT Plan      Co-evaluation             End of Session Equipment Utilized During Treatment: Gait belt Activity Tolerance: Patient tolerated treatment well;Patient limited by fatigue       Time: [id number] PT Time Calculation (min) (ACUTE ONLY): 47 min  Charges:  $Gait Training: 23-37 mins $Therapeutic Activity: 8-22 mins                    G Codes:      Chesley Noon 01/03/17, 12:40 PM

## 2016-12-14 NOTE — Progress Notes (Signed)
[place] [facility] [place] NAME: [name]    MR#:  [id number]  DATE OF BIRTH:  1934/07/22  SUBJECTIVE:  CHIEF COMPLAINT:  Patient is afebrile and coughing improved Alert today, worried about low BP  REVIEW OF SYSTEMS:  CONSTITUTIONAL: No fever, fatigue or weakness.  EYES: No blurred or double vision.  EARS, NOSE, AND THROAT: No tinnitus or ear pain.  RESPIRATORY: Reporting cough, improving  shortness of breath denies any wheezing or hemoptysis  CARDIOVASCULAR: No chest pain, orthopnea, edema.  GASTROINTESTINAL: No nausea, vomiting, diarrhea or abdominal pain.  GENITOURINARY: No dysuria, hematuria.  ENDOCRINE: No polyuria, nocturia,  HEMATOLOGY: No anemia, easy bruising or bleeding SKIN: No rash or lesion. MUSCULOSKELETAL: No joint pain or arthritis.   NEUROLOGIC: No tingling, numbness, weakness.  PSYCHIATRY: No anxiety or depression.   DRUG ALLERGIES:   Allergies  Allergen Reactions  . Ciprofloxacin Nausea And Vomiting    Headache  . Citalopram Hydrobromide Other (See Comments)    "unknown"  . Lorazepam     Adverse reaction  . Paroxetine Nausea Only  . Ramipril Other (See Comments)    "unknown"  . Simvastatin     VITALS:  Blood pressure (!) 116/44, pulse 68, temperature 98.4 F (36.9 C), temperature source Oral, resp. rate 19, height 5\' 11"  (1.803 m), weight 97.4 kg (214 lb 11.2 oz), SpO2 97 %.  PHYSICAL EXAMINATION:  GENERAL:  83 y.o.-year-old patient lying in the bed with no acute distress.  EYES: Pupils equal, round, reactive to light and accommodation. No scleral icterus. Extraocular muscles intact.  HEENT: Head atraumatic, normocephalic. Oropharynx and nasopharynx clear.  NECK:  Supple, no jugular venous distention. No thyroid enlargement, no tenderness.  LUNGS:  mod breath  sounds bilaterally, no wheezing, rales,rhonchi or crepitation. No use of accessory muscles of respiration.  CARDIOVASCULAR: S1, S2 normal. No  murmurs, rubs, or gallops.  ABDOMEN: Soft, nontender, nondistended. Bowel sounds present. No organomegaly or mass.  EXTREMITIES: No pedal edema, cyanosis, or clubbing.  NEUROLOGIC: Cranial nerves II through XII are intact. Muscle strength 5/5 in all extremities. Sensation intact. Gait not checked.  PSYCHIATRIC: The patient is alert and oriented x 3.  SKIN: No obvious rash, lesion, or ulcer.    LABORATORY PANEL:   CBC  Recent Labs Lab 12/13/16 0607  WBC 12.7*  HGB 11.4*  HCT 32.4*  PLT 107*   ------------------------------------------------------------------------------------------------------------------  Chemistries   Recent Labs Lab 12/11/16 1506  12/14/16 0645  NA 132*  < > 131*  K 4.3  < > 3.6  CL 98*  < > 98*  CO2 28  < > 28  GLUCOSE 148*  < > 117*  BUN 17  < > 15  CREATININE 0.87  < > 0.65  CALCIUM 8.4*  < > 7.9*  AST 18  --   --   ALT 11*  --   --   ALKPHOS 52  --   --   BILITOT 1.3*  --   --   < > = values in this interval not displayed. ------------------------------------------------------------------------------------------------------------------  Cardiac Enzymes No results for input(s): TROPONINI in the last 168 hours. ------------------------------------------------------------------------------------------------------------------  RADIOLOGY:  Dg Chest 2 View  Result Date: 12/14/2016 CLINICAL DATA:  Followup pneumonia.  History of prostate cancer. EXAM: CHEST  2 VIEW COMPARISON:  Chest radiograph and CT chest December 11, 2016 FINDINGS: Similar bibasilar strandy densities. No pleural effusion or focal consolidation. Flattened hemidiaphragms. Cardiac silhouette is normal, mildly calcified aortic knob.  Azygos fissure. Severe degenerative change of the shoulders. Accentuated thoracolumbar kyphosis. Soft tissue planes are nonsuspicious. IMPRESSION: Similar bibasilar atelectasis/ scarring. Electronically Signed   By: Elon Alas M.D.   On: 12/14/2016  14:33    EKG:   Orders placed or performed during the hospital encounter of 08/26/15  . EKG 12-Lead  . EKG 12-Lead    ASSESSMENT AND PLAN:  Patient comes to the ED with the fevers and cough  #Pneumonia He is treated with vancomycin and Zosyn in the ED.  Improving with Zithromax and Rocephin to cover CAP and UTI , change to po augmentin and zithro for total 5 days Blood cx neg MRSA PCR neg Urine cx klebsiella , sensitive to Rocephin nd cipro DuoNeb when necessary. Robitussin when necessary.   #UTI  Urine culture klebsiella and sensitive to  IV Rocephin, change to po augmentin  #,Thrombocytopenia  monitor platelet count closely. If platelet count is less than 100,000 will discontinue    #Hyponatremia. 2/2 SIADH  sodium 132- 129 -131 FLUID RESTRICTION Resumed lasix  follow-up BMP.   #Hypertension.  BP is soft  Decreased metoprolol to 25 mg from 50 mg Resume lasix  # Day time sleepiness ? OSA OP sleep study Nocturnal bipap/cpap  PT - HHPT  All the records are reviewed and case discussed with Care Management/Social Workerr. Management plans discussed with the patient, family and they are in agreement.  CODE STATUS: fc   TOTAL TIME TAKING CARE OF THIS PATIENT: 33  minutes.   POSSIBLE D/C IN 2 DAYS, DEPENDING ON CLINICAL CONDITION.  Note: This dictation was prepared with Dragon dictation along with smaller phrase technology. Any transcriptional errors that result from this process are unintentional.   Nicholes Mango M.D on 12/14/2016 at 8:45 PM  Between 7am to 6pm - Pager - [phone] After 6pm go to [email or web address] - password [email or web address] [place]  Office  [phone]  CC: Primary care physician; Viviana Simpler, MD

## 2016-12-15 ENCOUNTER — Ambulatory Visit: Payer: Medicare PPO | Admitting: Internal Medicine

## 2016-12-15 LAB — BASIC METABOLIC PANEL
Anion gap: 6 (ref 5–15)
BUN: 14 mg/dL (ref 6–20)
CO2: 29 mmol/L (ref 22–32)
Calcium: 7.7 mg/dL — ABNORMAL LOW (ref 8.9–10.3)
Chloride: 97 mmol/L — ABNORMAL LOW (ref 101–111)
Creatinine, Ser: 0.73 mg/dL (ref 0.61–1.24)
GFR calc Af Amer: >60 mL/min (ref >60)
Glucose, Bld: 119 mg/dL — ABNORMAL HIGH (ref 65–99)
POTASSIUM: 3.4 mmol/L — AB (ref 3.5–5.1)
SODIUM: 132 mmol/L — AB (ref 135–145)

## 2016-12-15 MED ORDER — GUAIFENESIN 100 MG/5ML PO SOLN: 10.0000 mL | Freq: Four times a day (QID) | ORAL | 0 refills | Status: DC | PRN

## 2016-12-15 MED ORDER — ALIGN 4 MG PO CAPS: 1.0000 | ORAL_CAPSULE | Freq: Every day | ORAL | 0 refills | Status: AC

## 2016-12-15 MED ORDER — METOPROLOL SUCCINATE ER 25 MG PO TB24: 25.0000 mg | ORAL_TABLET | Freq: Every day | ORAL | 0 refills | Status: DC

## 2016-12-15 MED ORDER — POTASSIUM CHLORIDE CRYS ER 20 MEQ PO TBCR
40.0000 meq | EXTENDED_RELEASE_TABLET | ORAL | Status: DC
Start: 2016-12-15 — End: 2016-12-15
  Administered 2016-12-15: 40 meq via ORAL
  Filled 2016-12-15: qty 2

## 2016-12-15 MED ORDER — AMOXICILLIN-POT CLAVULANATE 875-125 MG PO TABS: 1.0000 | ORAL_TABLET | Freq: Two times a day (BID) | ORAL | 0 refills | Status: DC

## 2016-12-15 MED ORDER — SENNOSIDES-DOCUSATE SODIUM 8.6-50 MG PO TABS: 1.0000 | ORAL_TABLET | Freq: Every evening | ORAL | Status: DC | PRN

## 2016-12-15 MED ORDER — POTASSIUM CHLORIDE CRYS ER 20 MEQ PO TBCR
40.0000 meq | EXTENDED_RELEASE_TABLET | Freq: Once | ORAL | Status: AC
  Administered 2016-12-15: 40 meq via ORAL
  Filled 2016-12-15: qty 2

## 2016-12-15 MED ORDER — POTASSIUM CHLORIDE CRYS ER 20 MEQ PO TBCR: 40.0000 meq | EXTENDED_RELEASE_TABLET | ORAL | 0 refills | Status: DC

## 2016-12-15 MED ORDER — ACETAMINOPHEN 325 MG PO TABS: 650.0000 mg | ORAL_TABLET | Freq: Four times a day (QID) | ORAL | Status: DC | PRN

## 2016-12-15 NOTE — Discharge Summary (Addendum)
[place] [facility] [place] NAME: [name]    MR#:  [id number]  DATE OF BIRTH:  [birth year]  DATE OF ADMISSION:  12/11/2016 ADMITTING PHYSICIAN: Demetrios Loll, MD  DATE OF DISCHARGE: 12/15/16 PRIMARY CARE PHYSICIAN: Viviana Simpler, MD    ADMISSION DIAGNOSIS:  Extrinsic asthma, mild intermittent, uncomplicated [id number] Urinary tract infection associated with indwelling urethral catheter, initial encounter ([facility]) [id number], N39.0] Community acquired pneumonia, unspecified laterality [J18.9]  DISCHARGE DIAGNOSIS:  Active Problems:   Pneumonia  UTI with kLEBSIELLA  SECONDARY DIAGNOSIS:   Past Medical History:  Diagnosis Date  . Allergy   . Anemia   . Anxiety   . Arthritis    rheumatoid  . Asthma   . CAD (coronary artery disease)   . Cancer [facility])    Prostate  . Chronic sinusitis   . Depression   . Diverticulitis   . ED (erectile dysfunction)   . GERD (gastroesophageal reflux disease)   . History of SIADH   . Hx of adenomatous colonic polyps   . Hypertension   . Hyponatremia   . IBS (irritable bowel syndrome)   . Neurodermatitis   . Neurogenic bladder   . OSA (obstructive sleep apnea)     HOSPITAL COURSE:  [name]  is a 83 y.o. male with a known history of Recent pneumonia, hypertension, hyponatremia, CAD and processes cancer. The patient was diagnosed with pneumonia 1 months ago and treated with 10 days of Levaquin. He is on chronic Foley catheter. He started to have fever and chills, cough and generalized weakness since yesterday. Chest x-ray show atelectasis but the CAT scan of the chest show infiltrate. Urinalysis show UTI.  Hospital course  #Pneumonia He is treated with vancomycin and Zosyn in the ED.  Improving with Zithromax and Rocephin to cover CAP and UTI , change to po augmentin and zithro for total 5 days Blood cx neg MRSA PCR neg Urine cx klebsiella , sensitive to Rocephin nd cipro-D/C pt with po augmentin  for 10 days. With probiotic  DuoNeb when necessary. Robitussin when necessary.   #UTI  Urine culture klebsiella and sensitive to  IV Rocephin, change to po augmentin  #,Thrombocytopenia- ch No bleeding or bruising noticed   monitor platelet count closely. If platelet count is less than 100,000 will discontinue    #Hyponatremia. 2/2 SIADH  sodium 132- 129 -131 FLUID RESTRICTION Resumed lasix  follow-up BMP.   #Hypertension.  BP is soft  Decreased metoprolol to 25 mg from 50 mg Resume lasix  # Day time sleepiness ? OSA OP sleep study Nocturnal bipap/cpap  PT - HHPT    DISCHARGE CONDITIONS:   Stable  CONSULTS OBTAINED:  Treatment Team:  Lavonia Dana, MD   PROCEDURES none   DRUG ALLERGIES:   Allergies  Allergen Reactions  . Ciprofloxacin Nausea And Vomiting    Headache  . Citalopram Hydrobromide Other (See Comments)    "unknown"  . Lorazepam     Adverse reaction  . Paroxetine Nausea Only  . Ramipril Other (See Comments)    "unknown"  . Simvastatin     DISCHARGE MEDICATIONS:   Current Discharge Medication List    START taking these medications   Details  acetaminophen (TYLENOL) 325 MG tablet Take 2 tablets (650 mg total) by mouth every 6 (six) hours as needed for mild pain (or Fever >/= 101).    amoxicillin-clavulanate (AUGMENTIN) 875-125 MG tablet Take 1 tablet by mouth every 12 (twelve) hours. Qty: 20  tablet, Refills: 0    guaiFENesin (ROBITUSSIN) 100 MG/5ML SOLN Take 10 mLs (200 mg total) by mouth every 6 (six) hours as needed for cough or to loosen phlegm. Qty: 1200 mL, Refills: 0    potassium chloride SA (K-DUR,KLOR-CON) 20 MEQ tablet Take 2 tablets (40 mEq total) by mouth every other day. Qty: 15 tablet, Refills: 0    senna-docusate (SENOKOT-S) 8.6-50 MG tablet Take 1 tablet by mouth at bedtime as needed for mild constipation.      CONTINUE these medications which have CHANGED   Details  metoprolol succinate (TOPROL-XL) 25 MG 24  hr tablet Take 1 tablet (25 mg total) by mouth daily. Qty: 30 tablet, Refills: 0    Probiotic Product (ALIGN) 4 MG CAPS Take 1 capsule (4 mg total) by mouth daily. Qty: 15 capsule, Refills: 0      CONTINUE these medications which have NOT CHANGED   Details  albuterol (PROVENTIL HFA;VENTOLIN HFA) 108 (90 Base) MCG/ACT inhaler Inhale 2 puffs into the lungs every 6 (six) hours as needed for wheezing or shortness of breath. Qty: 1 Inhaler, Refills: 3    ALPRAZolam (XANAX) 0.5 MG tablet TAKE 1 TABLET BY MOUTH THREE TIMES DAILY AS NEEDED FOR ANXIETY Qty: 90 tablet, Refills: 0    aspirin 81 MG tablet Take 81 mg by mouth daily.    Cholecalciferol (VITAMIN D3) 5000 UNITS CAPS Take 1 capsule by mouth daily.    Coenzyme Q10 (CO Q 10) 100 MG CAPS Take 1 capsule by mouth daily.    Cranberry 425 MG CAPS Take 2 capsules by mouth daily.    fluticasone (FLONASE) 50 MCG/ACT nasal spray Place 2 sprays into both nostrils 2 (two) times daily.    FOLIC ACID PO Take [id number] mcg by mouth daily.     furosemide (LASIX) 20 MG tablet TAKE 1 TABLET BY MOUTH EVERY DAY Qty: 30 tablet, Refills: 11    gabapentin (NEURONTIN) 300 MG capsule TAKE 1 CAPSULE BY MOUTH THREE TIMES DAILY Qty: 90 capsule, Refills: 11    hydroxychloroquine (PLAQUENIL) 200 MG tablet Take 1 tablet by mouth 2 (two) times daily.    levofloxacin (LEVAQUIN) 500 MG tablet Take 500 mg by mouth daily.    methotrexate (RHEUMATREX) 2.5 MG tablet Take 10 tablets by mouth once a week.    Milk Thistle 250 MG CAPS Take 1 capsule by mouth daily.    Multiple Vitamin (MULTIVITAMIN) tablet Take 1 tablet by mouth daily.      omeprazole (PRILOSEC) 20 MG capsule TAKE 1 CAPSULE BY MOUTH EVERY DAY Qty: 90 capsule, Refills: 3    polyethylene glycol (GLYCOLAX) packet Take 34 g by mouth daily.     pravastatin (PRAVACHOL) 20 MG tablet TAKE 1 TABLET BY MOUTH EVERY DAY Qty: 90 tablet, Refills: 1    predniSONE (DELTASONE) 20 MG tablet Take 40 mg by mouth  daily with breakfast.    Selenium 200 MCG CAPS Take 1 capsule by mouth daily.    solifenacin (VESICARE) 5 MG tablet Take 5 mg by mouth at bedtime. For bladder spasms    traMADol (ULTRAM) 50 MG tablet TAKE 1 TABLET BY MOUTH FOUR TIMES DAILY AS NEEDED Qty: 120 tablet, Refills: 0    Turmeric 500 MG CAPS Take 500 mg by mouth daily.    vitamin B-12 (CYANOCOBALAMIN) 500 MCG tablet Take 500 mcg by mouth daily.         DISCHARGE INSTRUCTIONS:   Continue home health physical therapy Continue antibiotic course as prescribed Follow-up with  primary care physician in a week Follow-up with Dr. Jamal Collin pulmonology in 2 weeks patient needs outpatient sleep study for possible sleep apnea F/u with urology dr.Cope as scheduled     DIET:  Cardiac diet, Fluid restriction to 1400 mL per day  DISCHARGE CONDITION:  Stable  ACTIVITY:  Activity as tolerated per PT  OXYGEN:  Home Oxygen: No.   Oxygen Delivery: room air  DISCHARGE LOCATION:  home   If you experience worsening of your admission symptoms, develop shortness of breath, life threatening emergency, suicidal or homicidal thoughts you must seek medical attention immediately by calling 911 or calling your MD immediately  if symptoms less severe.  You Must read complete instructions/literature along with all the possible adverse reactions/side effects for all the Medicines you take and that have been prescribed to you. Take any new Medicines after you have completely understood and accpet all the possible adverse reactions/side effects.   Please note  You were cared for by a hospitalist during your hospital stay. If you have any questions about your discharge medications or the care you received while you were in the hospital after you are discharged, you can call the unit and asked to speak with the hospitalist on call if the hospitalist that took care of you is not available. Once you are discharged, your primary care physician will  handle any further medical issues. Please note that NO REFILLS for any discharge medications will be authorized once you are discharged, as it is imperative that you return to your primary care physician (or establish a relationship with a primary care physician if you do not have one) for your aftercare needs so that they can reassess your need for medications and monitor your lab values.     Today  Chief Complaint  Patient presents with  . Fever  . Cough   Patient is doing much better shortness of breath improved. Comfortable to go home  ROS:  CONSTITUTIONAL: Denies fevers, chills. Denies any fatigue, weakness.  EYES: Denies blurry vision, double vision, eye pain. EARS, NOSE, THROAT: Denies tinnitus, ear pain, hearing loss. RESPIRATORY: IMPROVING  cough, wheeze, shortness of breath.  CARDIOVASCULAR: Denies chest pain, palpitations, edema.  GASTROINTESTINAL: Denies nausea, vomiting, diarrhea, abdominal pain. Denies bright red blood per rectum. GENITOURINARY: Denies dysuria, hematuria. ENDOCRINE: Denies nocturia or thyroid problems. HEMATOLOGIC AND LYMPHATIC: Denies easy bruising or bleeding. SKIN: Denies rash or lesion. MUSCULOSKELETAL: Denies pain in neck, back, shoulder, knees, hips or arthritic symptoms.  NEUROLOGIC: Denies paralysis, paresthesias.  PSYCHIATRIC: Denies anxiety or depressive symptoms.   VITAL SIGNS:  Blood pressure (!) 124/48, pulse 72, temperature 98.6 F (37 C), temperature source Oral, resp. rate 18, height 5\' 11"  (1.803 m), weight 97.4 kg (214 lb 11.2 oz), SpO2 94 %.  I/O:    Intake/Output Summary (Last 24 hours) at 12/15/16 1502 Last data filed at 12/15/16 0900  Gross per 24 hour  Intake              970 ml  Output             2000 ml  Net            -1030 ml    PHYSICAL EXAMINATION:  GENERAL:  83 y.o.-year-old patient lying in the bed with no acute distress.  EYES: Pupils equal, round, reactive to light and accommodation. No scleral icterus.  Extraocular muscles intact.  HEENT: Head atraumatic, normocephalic. Oropharynx and nasopharynx clear.  NECK:  Supple, no jugular venous distention. No  thyroid enlargement, no tenderness.  LUNGS: mod breath sounds bilaterally, no wheezing, rales,rhonchi or crepitation. No use of accessory muscles of respiration.  CARDIOVASCULAR: S1, S2 normal. No murmurs, rubs, or gallops.  ABDOMEN: Soft, non-tender, non-distended. Bowel sounds present. No organomegaly or mass.  EXTREMITIES: No pedal edema, cyanosis, or clubbing.  NEUROLOGIC: Cranial nerves II through XII are intact. Muscle strength 5/5 in all extremities. Sensation intact. Gait not checked.  PSYCHIATRIC: The patient is alert and oriented x 3.  SKIN: No obvious rash, lesion, or ulcer.   DATA REVIEW:   CBC  Recent Labs Lab 12/13/16 0607  WBC 12.7*  HGB 11.4*  HCT 32.4*  PLT 107*    Chemistries   Recent Labs Lab 12/11/16 1506  12/15/16 0339  NA 132*  < > 132*  K 4.3  < > 3.4*  CL 98*  < > 97*  CO2 28  < > 29  GLUCOSE 148*  < > 119*  BUN 17  < > 14  CREATININE 0.87  < > 0.73  CALCIUM 8.4*  < > 7.7*  AST 18  --   --   ALT 11*  --   --   ALKPHOS 52  --   --   BILITOT 1.3*  --   --   < > = values in this interval not displayed.  Cardiac Enzymes No results for input(s): TROPONINI in the last 168 hours.  Microbiology Results  Results for orders placed or performed during the hospital encounter of 12/11/16  Urine culture     Status: Abnormal   Collection Time: 12/11/16  6:20 PM  Result Value Ref Range Status   Specimen Description URINE, CATHETERIZED  Final   Special Requests Normal  Final   Culture >=100,000 COLONIES/mL KLEBSIELLA PNEUMONIAE (A)  Final   Report Status 12/14/2016 FINAL  Final   Organism ID, Bacteria KLEBSIELLA PNEUMONIAE (A)  Final      Susceptibility   Klebsiella pneumoniae - MIC*    AMPICILLIN >=32 RESISTANT Resistant     CEFAZOLIN <=4 SENSITIVE Sensitive     CEFTRIAXONE <=1 SENSITIVE Sensitive      CIPROFLOXACIN 0.5 SENSITIVE Sensitive     GENTAMICIN <=1 SENSITIVE Sensitive     IMIPENEM <=0.25 SENSITIVE Sensitive     NITROFURANTOIN 256 RESISTANT Resistant     TRIMETH/SULFA 40 SENSITIVE Sensitive     AMPICILLIN/SULBACTAM 8 SENSITIVE Sensitive     PIP/TAZO 16 SENSITIVE Sensitive     Extended ESBL NEGATIVE Sensitive     * >=100,000 COLONIES/mL KLEBSIELLA PNEUMONIAE  Culture, blood (routine x 2) Call MD if unable to obtain prior to antibiotics being given     Status: None (Preliminary result)   Collection Time: 12/11/16  9:06 PM  Result Value Ref Range Status   Specimen Description BLOOD LEFT ASSIST CONTROL  Final   Special Requests   Final    BOTTLES DRAWN AEROBIC AND ANAEROBIC 9MLAERO, 7MLANAERO   Culture NO GROWTH 4 DAYS  Final   Report Status PENDING  Incomplete  Culture, blood (routine x 2) Call MD if unable to obtain prior to antibiotics being given     Status: None (Preliminary result)   Collection Time: 12/11/16  9:06 PM  Result Value Ref Range Status   Specimen Description BLOOD RIGHT ASSIST CONTROL  Final   Special Requests   Final    BOTTLES DRAWN AEROBIC AND ANAEROBIC AERO9ML,ANAERO9ML   Culture NO GROWTH 4 DAYS  Final   Report Status PENDING  Incomplete  RADIOLOGY:  Dg Chest 2 View  Result Date: 12/14/2016 CLINICAL DATA:  Followup pneumonia.  History of prostate cancer. EXAM: CHEST  2 VIEW COMPARISON:  Chest radiograph and CT chest December 11, 2016 FINDINGS: Similar bibasilar strandy densities. No pleural effusion or focal consolidation. Flattened hemidiaphragms. Cardiac silhouette is normal, mildly calcified aortic knob. Azygos fissure. Severe degenerative change of the shoulders. Accentuated thoracolumbar kyphosis. Soft tissue planes are nonsuspicious. IMPRESSION: Similar bibasilar atelectasis/ scarring. Electronically Signed   By: Elon Alas M.D.   On: 12/14/2016 14:33   Dg Chest 2 View  Result Date: 12/11/2016 CLINICAL DATA:  Fever, had pneumonia in  December 2017, fatigue, history asthma, coronary artery disease, hypertension, GERD, prostate cancer, irritable bowel syndrome EXAM: CHEST  2 VIEW COMPARISON:  12/06/2016 FINDINGS: Upper normal heart size. Atherosclerotic calcification aorta. Mediastinal contours and pulmonary vascularity normal. Azygos fissure noted. Emphysematous changes with bibasilar atelectasis greater on LEFT. Remaining lungs clear. No pleural effusion or pneumothorax. Bones demineralized with BILATERAL glenohumeral degenerative changes. IMPRESSION: Bibasilar atelectasis LEFT greater than RIGHT. Aortic atherosclerosis. Electronically Signed   By: Lavonia Dana M.D.   On: 12/11/2016 15:49   Ct Chest Wo Contrast  Result Date: 12/11/2016 CLINICAL DATA:  Persistent cough EXAM: CT CHEST WITHOUT CONTRAST TECHNIQUE: Multidetector CT imaging of the chest was performed following the standard protocol without IV contrast. COMPARISON:  Chest x-ray 12/11/2016, CT chest 07/11/2011 FINDINGS: Cardiovascular: Limited evaluation without intravenous contrast. Atherosclerotic vascular calcifications within the aorta and great vessels. No aneurysmal dilatation. Coronary artery calcifications are present. Heart size upper normal. Trace pericardial fluid. Mediastinum/Nodes: Nonspecific subcentimeter lymph nodes in the mediastinum. Thyroid within normal limits. Trachea is midline. Asymmetric right esophageal thickening at the GE junction. There is mild asymmetric thickening of the left laryngeal tissues. Lungs/Pleura: Right azygos lobe. No significant pleural effusion. Stable calcified granuloma in the lingula. Hazy density in the posterior right lower lobe may reflect resolving infiltrate. Partial consolidation in the posterior left lower lobe. Upper Abdomen: Spleen slightly enlarged at 13 cm. Possible mild hydronephrosis upper pole right kidney. Musculoskeletal: Degenerative changes. No acute or suspicious bone lesions IMPRESSION: 1. Partial consolidation in the  left lower lobe may reflect a mild pneumonia. Hazy densities in the posterior right lower lobe could reflect additional resolving infiltrate. 2. Mild asymmetric thickening of the right GE junction, consider endoscopy follow-up to exclude a mass 3. Asymmetric soft tissue thickening of the left larynx, could be further evaluated with direct visualization. 4. Slightly enlarged spleen 5. Possible mild hydronephrosis of visualized upper pole right kidney. Electronically Signed   By: Donavan Foil M.D.   On: 12/11/2016 19:06    EKG:   Orders placed or performed during the hospital encounter of 08/26/15  . EKG 12-Lead  . EKG 12-Lead      Management plans discussed with the patient, family and they are in agreement.  CODE STATUS:     Code Status Orders        Start     Ordered   12/11/16 2140  Full code  Continuous     12/11/16 2139    Code Status History    Date Active Date Inactive Code Status Order ID Comments User Context   09/07/2015  3:05 PM 09/08/2015  5:44 PM Full Code [id number]  Dustin Flock, MD Inpatient   09/02/2015  5:16 PM 09/05/2015  4:52 PM Full Code [id number]  [name], MD Inpatient      TOTAL TIME TAKING CARE OF THIS PATIENT: 43  minutes.  Note: This dictation was prepared with Dragon dictation along with smaller phrase technology. Any transcriptional errors that result from this process are unintentional.   @[facility] @  on 12/15/2016 at 3:02 PM  Between 7am to 6pm - Pager - [phone]  After 6pm go to [email or web address] - password [email or web address] [facility]  Office  [phone]  CC: Primary care physician; Viviana Simpler, MD

## 2016-12-15 NOTE — Discharge Instructions (Signed)
Continue home health physical therapy Continue antibiotic course as prescribed Follow-up with primary care physician in a week Follow-up with Dr. Jamal Collin pulmonology in 2 weeks patient needs outpatient sleep study for possible sleep apnea F/u with urology dr.Cope as scheduled

## 2016-12-15 NOTE — Progress Notes (Signed)
[facility]  ROUNDING NOTE   Subjective:   Na 132  More interactive this morning. Breathing better.   Objective:  Vital signs in last 24 hours:  Temp:  [98.2 F (36.8 C)-98.6 F (37 C)] 98.6 F (37 C) (01/18 0424) Pulse Rate:  [68-74] 72 (01/18 0424) Resp:  [18-19] 18 (01/18 0424) BP: (100-124)/(44-49) 124/48 (01/18 0424) SpO2:  [94 %-97 %] 94 % (01/18 0424)  Weight change:  Filed Weights   12/11/16 1457 12/11/16 2142  Weight: 98.9 kg (218 lb) 97.4 kg (214 lb 11.2 oz)    Intake/Output: I/O last 3 completed shifts: In: 2050 [P.O.:1200; IV Piggyback:850] Out: 2650 [Urine:2650]   Intake/Output this shift:  Total I/O In: 240 [P.O.:240] Out: -   Physical Exam: General: NAD,   Head: Normocephalic, atraumatic. Dry oral mucosal membranes  Eyes: Anicteric, PERRL  Neck: Supple, trachea midline  Lungs:  Clear to auscultation  Heart: Regular rate and rhythm  Abdomen:  Soft, nontender,   Extremities:  no peripheral edema.  Neurologic: Nonfocal, moving all four extremities  Skin: No lesions       Basic Metabolic Panel:  Recent Labs Lab 12/11/16 1506 12/12/16 0315 12/13/16 0607 12/14/16 0645 12/15/16 0339  NA 132* 132* 129* 131* 132*  K 4.3 3.7 3.9 3.6 3.4*  CL 98* 95* 95* 98* 97*  CO2 28 28 28 28 29   GLUCOSE 148* 130* 112* 117* 119*  BUN 17 19 21* 15 14  CREATININE 0.87 0.86 1.00 0.65 0.73  CALCIUM 8.4* 8.3* 7.6* 7.9* 7.7*    Liver Function Tests:  Recent Labs Lab 12/11/16 1506  AST 18  ALT 11*  ALKPHOS 52  BILITOT 1.3*  PROT 6.2*  ALBUMIN 3.5   No results for input(s): LIPASE, AMYLASE in the last 168 hours. No results for input(s): AMMONIA in the last 168 hours.  CBC:  Recent Labs Lab 12/11/16 1506 12/12/16 0315 12/13/16 0607  WBC 16.8* 16.5* 12.7*  NEUTROABS 15.3*  --   --   HGB 13.6 12.6* 11.4*  HCT 38.5* 35.4* 32.4*  MCV 94.8 96.2 94.8  PLT 139* 104* 107*    Cardiac Enzymes: No results for input(s): CKTOTAL, CKMB,  CKMBINDEX, TROPONINI in the last 168 hours.  BNP: Invalid input(s): POCBNP  CBG: No results for input(s): GLUCAP in the last 168 hours.  Microbiology: Results for orders placed or performed during the hospital encounter of 12/11/16  Urine culture     Status: Abnormal   Collection Time: 12/11/16  6:20 PM  Result Value Ref Range Status   Specimen Description URINE, CATHETERIZED  Final   Special Requests Normal  Final   Culture >=100,000 COLONIES/mL KLEBSIELLA PNEUMONIAE (A)  Final   Report Status 12/14/2016 FINAL  Final   Organism ID, Bacteria KLEBSIELLA PNEUMONIAE (A)  Final      Susceptibility   Klebsiella pneumoniae - MIC*    AMPICILLIN >=32 RESISTANT Resistant     CEFAZOLIN <=4 SENSITIVE Sensitive     CEFTRIAXONE <=1 SENSITIVE Sensitive     CIPROFLOXACIN 0.5 SENSITIVE Sensitive     GENTAMICIN <=1 SENSITIVE Sensitive     IMIPENEM <=0.25 SENSITIVE Sensitive     NITROFURANTOIN 256 RESISTANT Resistant     TRIMETH/SULFA 40 SENSITIVE Sensitive     AMPICILLIN/SULBACTAM 8 SENSITIVE Sensitive     PIP/TAZO 16 SENSITIVE Sensitive     Extended ESBL NEGATIVE Sensitive     * >=100,000 COLONIES/mL KLEBSIELLA PNEUMONIAE  Culture, blood (routine x 2) Call MD if unable to obtain  prior to antibiotics being given     Status: None (Preliminary result)   Collection Time: 12/11/16  9:06 PM  Result Value Ref Range Status   Specimen Description BLOOD LEFT ASSIST CONTROL  Final   Special Requests   Final    BOTTLES DRAWN AEROBIC AND ANAEROBIC 9MLAERO, 7MLANAERO   Culture NO GROWTH 4 DAYS  Final   Report Status PENDING  Incomplete  Culture, blood (routine x 2) Call MD if unable to obtain prior to antibiotics being given     Status: None (Preliminary result)   Collection Time: 12/11/16  9:06 PM  Result Value Ref Range Status   Specimen Description BLOOD RIGHT ASSIST CONTROL  Final   Special Requests   Final    BOTTLES DRAWN AEROBIC AND ANAEROBIC AERO9ML,ANAERO9ML   Culture NO GROWTH 4 DAYS  Final    Report Status PENDING  Incomplete    Coagulation Studies: No results for input(s): LABPROT, INR in the last 72 hours.  Urinalysis: No results for input(s): COLORURINE, LABSPEC, PHURINE, GLUCOSEU, HGBUR, BILIRUBINUR, KETONESUR, PROTEINUR, UROBILINOGEN, NITRITE, LEUKOCYTESUR in the last 72 hours.  Invalid input(s): APPERANCEUR    Imaging: Dg Chest 2 View  Result Date: 12/14/2016 CLINICAL DATA:  Followup pneumonia.  History of prostate cancer. EXAM: CHEST  2 VIEW COMPARISON:  Chest radiograph and CT chest December 11, 2016 FINDINGS: Similar bibasilar strandy densities. No pleural effusion or focal consolidation. Flattened hemidiaphragms. Cardiac silhouette is normal, mildly calcified aortic knob. Azygos fissure. Severe degenerative change of the shoulders. Accentuated thoracolumbar kyphosis. Soft tissue planes are nonsuspicious. IMPRESSION: Similar bibasilar atelectasis/ scarring. Electronically Signed   By: Elon Alas M.D.   On: 12/14/2016 14:33     Medications:    . acidophilus  1 capsule Oral Daily  . amoxicillin-clavulanate  1 tablet Oral Q12H  . aspirin EC  81 mg Oral Daily  . azithromycin  500 mg Intravenous Q24H  . cholecalciferol  5,000 Units Oral Daily  . darifenacin  7.5 mg Oral Daily  . enoxaparin (LOVENOX) injection  40 mg Subcutaneous Q24H  . fluticasone  2 spray Each Nare BID  . folic acid  [id number] mcg Oral Daily  . furosemide  20 mg Oral Daily  . gabapentin  300 mg Oral TID  . hydroxychloroquine  200 mg Oral BID  . metoprolol succinate  25 mg Oral Daily  . pantoprazole  40 mg Oral Daily  . polyethylene glycol  34 g Oral Daily  . pravastatin  20 mg Oral q1800  . vitamin B-12  500 mcg Oral Daily   acetaminophen **OR** acetaminophen, albuterol, ALPRAZolam, bisacodyl, guaiFENesin, ondansetron **OR** ondansetron (ZOFRAN) IV, senna-docusate, traMADol  Assessment/ Plan:  [name] is a 83 y.o. white male with COPD/asthma, hypertension, prostate cancer,  hyperlipidemia, inflammatory arthritis  1. Chronic hyponatremia: secondary to SIADH. now at goal 130-135.  -  fluid restriction - furosemide.  - monitor volume status  2. Hypokalemia: secondary to furosemide.  - PO potassium chloride.   3. Hypertension: blood pressure at goal. - metoprolol and furosemide.    LOS: 4 Trea Latner 1/18/201811:48 AM

## 2016-12-15 NOTE — Care Management (Addendum)
Patient discharging to home today. I contacted patient by phone to arrange home health PT. He had no preference for home health- referral called to [place] [facility]. Patient would like to know his cost for home health- then he will decide if he allow them to start care. I have asked Corene Cornea for co-pay amount. I told patient I would call him at home with price when obtained. I have paged Dr. Margaretmary Eddy for home health orders--  She agrees and will put HHPT orders in. Per Corene Cornea no co-pay. I have notified patient's wife and she will tell patient when he arrives home. Case closed.

## 2016-12-16 ENCOUNTER — Telehealth: Payer: Self-pay | Admitting: Pulmonary Disease

## 2016-12-16 LAB — CULTURE, BLOOD (ROUTINE X 2)
CULTURE: NO GROWTH
Culture: NO GROWTH

## 2016-12-16 NOTE — Telephone Encounter (Signed)
Attempted to contact pt. No answer, no option to leave a message. Will try back.  

## 2016-12-19 ENCOUNTER — Encounter: Payer: Self-pay | Admitting: Internal Medicine

## 2016-12-19 ENCOUNTER — Ambulatory Visit (INDEPENDENT_AMBULATORY_CARE_PROVIDER_SITE_OTHER): Payer: Medicare PPO | Admitting: Internal Medicine

## 2016-12-19 DIAGNOSIS — J181 Lobar pneumonia, unspecified organism: Secondary | ICD-10-CM

## 2016-12-19 DIAGNOSIS — M05741 Rheumatoid arthritis with rheumatoid factor of right hand without organ or systems involvement: Secondary | ICD-10-CM | POA: Diagnosis not present

## 2016-12-19 DIAGNOSIS — F39 Unspecified mood [affective] disorder: Secondary | ICD-10-CM | POA: Diagnosis not present

## 2016-12-19 DIAGNOSIS — T83511A Infection and inflammatory reaction due to indwelling urethral catheter, initial encounter: Secondary | ICD-10-CM | POA: Insufficient documentation

## 2016-12-19 DIAGNOSIS — J4521 Mild intermittent asthma with (acute) exacerbation: Secondary | ICD-10-CM | POA: Diagnosis not present

## 2016-12-19 DIAGNOSIS — N390 Urinary tract infection, site not specified: Secondary | ICD-10-CM | POA: Insufficient documentation

## 2016-12-19 DIAGNOSIS — M05742 Rheumatoid arthritis with rheumatoid factor of left hand without organ or systems involvement: Secondary | ICD-10-CM

## 2016-12-19 MED ORDER — ALBUTEROL SULFATE (2.5 MG/3ML) 0.083% IN NEBU: 2.5000 mg | INHALATION_SOLUTION | Freq: Four times a day (QID) | RESPIRATORY_TRACT | 1 refills | Status: DC | PRN

## 2016-12-19 MED ORDER — HYDROCORTISONE 2.5 % EX CREA: TOPICAL_CREAM | Freq: Three times a day (TID) | CUTANEOUS | 3 refills | Status: DC | PRN

## 2016-12-19 NOTE — Assessment & Plan Note (Signed)
Not clear this was the main issue in the hospitalization Finishing the augmentin Will need pulmonary follow up

## 2016-12-19 NOTE — Telephone Encounter (Signed)
Spoke with pt's wife, who states she will have pt return our call to scheduled an apt.

## 2016-12-19 NOTE — Progress Notes (Signed)
Subjective:    Patient ID: [name], male    DOB: Jun 04, 1934, 83 y.o.   MRN: [id number]  HPI Here with daughter for hospital follow up  He thinks he has "felt worse than I have in 20 years" Feels he needs the nebulizer more--needs new Rx Reviewed multiple CXRs and then the vague findings on the CT Did have Klebsiella UTI Now on augmentin   MTX has been on hold due to the infections--he did this I told him it is okay to restart this--as his RA has been flaring  Hasn't had the xolair due to his respiratory symptoms Does have follow up with Tammy Parrett on 2/9 Will try to set up evaluation sooner He is having trouble   Concerned about low blood pressures in the hospital Metoprolol was decreased and he is holding off on it completely  Current Outpatient Prescriptions on File Prior to Visit  Medication Sig Dispense Refill  . acetaminophen (TYLENOL) 325 MG tablet Take 2 tablets (650 mg total) by mouth every 6 (six) hours as needed for mild pain (or Fever >/= 101).    Marland Kitchen albuterol (PROVENTIL HFA;VENTOLIN HFA) 108 (90 Base) MCG/ACT inhaler Inhale 2 puffs into the lungs every 6 (six) hours as needed for wheezing or shortness of breath. 1 Inhaler 3  . ALPRAZolam (XANAX) 0.5 MG tablet TAKE 1 TABLET BY MOUTH THREE TIMES DAILY AS NEEDED FOR ANXIETY 90 tablet 0  . amoxicillin-clavulanate (AUGMENTIN) 875-125 MG tablet Take 1 tablet by mouth every 12 (twelve) hours. 20 tablet 0  . aspirin 81 MG tablet Take 81 mg by mouth daily.    . Cholecalciferol (VITAMIN D3) 5000 UNITS CAPS Take 1 capsule by mouth daily.    . Coenzyme Q10 (CO Q 10) 100 MG CAPS Take 1 capsule by mouth daily.    . Cranberry 425 MG CAPS Take 2 capsules by mouth daily.    . fluticasone (FLONASE) 50 MCG/ACT nasal spray Place 2 sprays into both nostrils 2 (two) times daily.    Marland Kitchen FOLIC ACID PO Take [id number] mcg by mouth daily.     . furosemide (LASIX) 20 MG tablet TAKE 1 TABLET BY MOUTH EVERY DAY 30 tablet 11  . gabapentin  (NEURONTIN) 300 MG capsule TAKE 1 CAPSULE BY MOUTH THREE TIMES DAILY 90 capsule 11  . guaiFENesin (ROBITUSSIN) 100 MG/5ML SOLN Take 10 mLs (200 mg total) by mouth every 6 (six) hours as needed for cough or to loosen phlegm. 1200 mL 0  . hydroxychloroquine (PLAQUENIL) 200 MG tablet Take 1 tablet by mouth 2 (two) times daily.    Marland Kitchen levofloxacin (LEVAQUIN) 500 MG tablet Take 500 mg by mouth daily.    . methotrexate (RHEUMATREX) 2.5 MG tablet Take 10 tablets by mouth once a week.    . metoprolol succinate (TOPROL-XL) 25 MG 24 hr tablet Take 1 tablet (25 mg total) by mouth daily. 30 tablet 0  . Milk Thistle 250 MG CAPS Take 1 capsule by mouth daily.    . Multiple Vitamin (MULTIVITAMIN) tablet Take 1 tablet by mouth daily.      Marland Kitchen omeprazole (PRILOSEC) 20 MG capsule TAKE 1 CAPSULE BY MOUTH EVERY DAY 90 capsule 3  . polyethylene glycol (GLYCOLAX) packet Take 34 g by mouth daily.     . potassium chloride SA (K-DUR,KLOR-CON) 20 MEQ tablet Take 2 tablets (40 mEq total) by mouth every other day. 15 tablet 0  . pravastatin (PRAVACHOL) 20 MG tablet TAKE 1 TABLET BY MOUTH EVERY DAY 90  tablet 1  . predniSONE (DELTASONE) 20 MG tablet Take 40 mg by mouth daily with breakfast.    . Probiotic Product (ALIGN) 4 MG CAPS Take 1 capsule (4 mg total) by mouth daily. 15 capsule 0  . Selenium 200 MCG CAPS Take 1 capsule by mouth daily.    Marland Kitchen senna-docusate (SENOKOT-S) 8.6-50 MG tablet Take 1 tablet by mouth at bedtime as needed for mild constipation.    . solifenacin (VESICARE) 5 MG tablet Take 5 mg by mouth at bedtime. For bladder spasms    . traMADol (ULTRAM) 50 MG tablet TAKE 1 TABLET BY MOUTH FOUR TIMES DAILY AS NEEDED 120 tablet 0  . Turmeric 500 MG CAPS Take 500 mg by mouth daily.    . vitamin B-12 (CYANOCOBALAMIN) 500 MCG tablet Take 500 mcg by mouth daily.     Current Facility-Administered Medications on File Prior to Visit  Medication Dose Route Frequency Provider Last Rate Last Dose  . omalizumab Arvid Right)  injection 375 mg  375 mg Subcutaneous Q14 Days Chesley Mires, MD   375 mg at 09/28/16 1203  . omalizumab Arvid Right) injection 375 mg  375 mg Subcutaneous Q14 Days Chesley Mires, MD   375 mg at 10/13/16 1151  . omalizumab Arvid Right) injection 375 mg  375 mg Subcutaneous Q14 Days Chesley Mires, MD   375 mg at 10/31/16 1538    Allergies  Allergen Reactions  . Ciprofloxacin Nausea And Vomiting    Headache  . Citalopram Hydrobromide Other (See Comments)    "unknown"  . Lorazepam     Adverse reaction  . Paroxetine Nausea Only  . Ramipril Other (See Comments)    "unknown"  . Simvastatin     Past Medical History:  Diagnosis Date  . Allergy   . Anemia   . Anxiety   . Arthritis    rheumatoid  . Asthma   . CAD (coronary artery disease)   . Cancer [facility])    Prostate  . Chronic sinusitis   . Depression   . Diverticulitis   . ED (erectile dysfunction)   . GERD (gastroesophageal reflux disease)   . History of SIADH   . Hx of adenomatous colonic polyps   . Hypertension   . Hyponatremia   . IBS (irritable bowel syndrome)   . Neurodermatitis   . Neurogenic bladder   . OSA (obstructive sleep apnea)     Past Surgical History:  Procedure Laterality Date  . KNEE ARTHROPLASTY Right 09/02/2015   Procedure: COMPUTER ASSISTED TOTAL KNEE ARTHROPLASTY;  Surgeon: [name], MD;  Location: [facility] ORS;  Service: Orthopedics;  Laterality: Right;  . KNEE SURGERY Left    ARTHROSCOPY LEFT  . NASAL SINUS SURGERY  2009   DEVIATED SEPTUM AND POLYPS  . PROSTATE SURGERY     PROSTATECTOMY  . TOTAL KNEE ARTHROPLASTY Left 9/15   Dr [name]  . URETHRAL STRICTURE DILATATION  02-2010   Dr.Cope    Family History  Problem Relation Age of Onset  . Heart disease Mother 75    heart failure  . Pneumonia Father 17    pnemonia  . Colon cancer Neg Hx     Social History   Social History  . Marital status: Married    Spouse name: N/A  . Number of children: 2  . Years of education: N/A   Occupational  History  . Radio station owner     retired   Social History Main Topics  . Smoking status: Never Smoker  . Smokeless  tobacco: Never Used  . Alcohol use 7.2 oz/week    10 Standard drinks or equivalent, 2 Shots of liquor per week  . Drug use: No  . Sexual activity: No   Other Topics Concern  . Not on file   Social History Narrative   Not sure about a living will or health care POA   Wife should make health care decisions for him   Would accept resuscitation attempts   Not sure about tube feeds   Review of Systems  Having trouble with hemorrhoids--- has pressure but no blood. Will have slight liquid but it is tender. Using preparation H--not helping. Appetite still horrible Has lost weight but coming back up Has taken some supplements     Objective:   Physical Exam  Constitutional: He appears well-developed and well-nourished. No distress.  Neck: Normal range of motion. No thyromegaly present.  Cardiovascular: Normal rate, regular rhythm and normal heart sounds.  Exam reveals no gallop.   No murmur heard. Pulmonary/Chest: Effort normal.  Fair air movement Slight prolonged exp phase and mild exp wheezing No crackles  Musculoskeletal: He exhibits no edema.  Lymphadenopathy:    He has no cervical adenopathy.  Psychiatric:  Very anxious          Assessment & Plan:

## 2016-12-19 NOTE — Assessment & Plan Note (Signed)
Ongoing anxiety Continue meds

## 2016-12-19 NOTE — Progress Notes (Signed)
Pre visit review using our clinic review tool, if applicable. No additional management support is needed unless otherwise documented below in the visit note. 

## 2016-12-19 NOTE — Assessment & Plan Note (Signed)
May have been the most of the illness in the hospital Finish out the augmentin

## 2016-12-19 NOTE — Assessment & Plan Note (Signed)
Exacerbating due to no MTX Told him to go ahead and restart

## 2016-12-19 NOTE — Telephone Encounter (Signed)
VS please advise if we can schedule the pt with you in a 15 min slot for a post hospital.  This is the only opening that you have until march.  thanks

## 2016-12-19 NOTE — Telephone Encounter (Signed)
That would not be enough time to address issues with Mr. Chipley.  He needs at least 30 minute time slot.  Please schedule with NP.

## 2016-12-19 NOTE — Assessment & Plan Note (Signed)
Rx for the albuterol solution Hopefully he can get the xolair again soon

## 2016-12-21 ENCOUNTER — Ambulatory Visit: Payer: Medicare PPO | Admitting: Internal Medicine

## 2016-12-21 NOTE — Telephone Encounter (Signed)
Spoke with pt, who states he has been without his xolair injection since 10/29/16 due to PNA. Pt feels he feels his asthma is flaring up. Pt seen PCP on 12/19/16, who states pt's PNA is cleared but pt did have some wheezing. Dr. Silvio Pate seems to think pt is having a asthma flare. Pt is wanting clearance to re start xolair.  Pt has been scheduled for hosp f/u with TP on 12/26/16. Pt aware and voiced his understanding. Nothing further needed.

## 2016-12-26 ENCOUNTER — Ambulatory Visit (INDEPENDENT_AMBULATORY_CARE_PROVIDER_SITE_OTHER)
Admission: RE | Admit: 2016-12-26 | Discharge: 2016-12-26 | Disposition: A | Discharge status: Home / Self Care | Payer: Medicare PPO | Source: Ambulatory Visit | Attending: Adult Health | Admitting: Adult Health

## 2016-12-26 ENCOUNTER — Ambulatory Visit (INDEPENDENT_AMBULATORY_CARE_PROVIDER_SITE_OTHER): Payer: Medicare PPO | Admitting: Adult Health

## 2016-12-26 ENCOUNTER — Telehealth: Payer: Self-pay | Admitting: Pulmonary Disease

## 2016-12-26 DIAGNOSIS — R0602 Shortness of breath: Secondary | ICD-10-CM | POA: Diagnosis not present

## 2016-12-26 DIAGNOSIS — J181 Lobar pneumonia, unspecified organism: Secondary | ICD-10-CM | POA: Diagnosis not present

## 2016-12-26 DIAGNOSIS — J4541 Moderate persistent asthma with (acute) exacerbation: Secondary | ICD-10-CM | POA: Diagnosis not present

## 2016-12-26 DIAGNOSIS — R05 Cough: Secondary | ICD-10-CM | POA: Diagnosis not present

## 2016-12-26 MED ORDER — BUDESONIDE-FORMOTEROL FUMARATE 160-4.5 MCG/ACT IN AERO: 2.0000 | INHALATION_SPRAY | Freq: Two times a day (BID) | RESPIRATORY_TRACT | 6 refills | Status: DC

## 2016-12-26 MED ORDER — BUDESONIDE-FORMOTEROL FUMARATE 160-4.5 MCG/ACT IN AERO: 2.0000 | INHALATION_SPRAY | Freq: Two times a day (BID) | RESPIRATORY_TRACT | 0 refills | Status: DC

## 2016-12-26 MED ORDER — PREDNISONE 10 MG PO TABS: ORAL_TABLET | ORAL | 0 refills | Status: DC

## 2016-12-26 NOTE — Progress Notes (Signed)
@Patient  ID: [name], male    DOB: Sep 07, 1934, 83 y.o.   MRN: [id number]  Chief Complaint  Patient presents with  . Follow-up    PNA/asthma     Referring provider: [name], MD  HPI: 83 yo male never smoker followed for moderate persistent Allergic Asthma on Xolair   RA on MTX /Plaquenil   Neurogenic bladder w/ chronic foley   TEST  PFT 04/07/10 >> FEV1 2.61 (86%), FEV1% 73, TLC 7.26 (85%), TLC 112%  12/26/16 Follow up : PNA/Asthma  Pt returns for 3 week follow up . Dx with LLL PNA ~ 1 month ago. Initially treated with Omnicef then 10 days of Levaquin . He did have some clinical improvement but chest xray did not show any significant change on 1/9.  unfortnuatley he was admitted on 1/14 with Klebsillea UTI. He was given IV abx and discharged on 10 d of augmentin . He says he still feels bad and has significant cough and intermittent wheezing . He has been off his Xolair and feels it is causing his asthma to act up . Has thick mucus that is hard to get up at times. No fever or hemoptyisis.  CT chest oin 1/14 showed partial consolidation of LLL . Hazy densities in RLL .  CXR today shows resolved RLL  And residual LLL atx vs scarring .  Hospital notes from [facility] stay were reviewed.     Allergies  Allergen Reactions  . Ciprofloxacin Nausea And Vomiting    Headache  . Citalopram Hydrobromide Other (See Comments)    "unknown"  . Lorazepam     Adverse reaction  . Paroxetine Nausea Only  . Ramipril Other (See Comments)    "unknown"  . Simvastatin     Immunization History  Administered Date(s) Administered  . Influenza Split 09/26/2011  . Influenza Whole 09/07/2007, 08/21/2008, 09/01/2009, 08/23/2010  . Influenza,inj,Quad PF,36+ Mos 09/04/2013, 07/28/2014, 08/20/2015, 08/08/2016  . Influenza-Unspecified 09/06/2012  . Pneumococcal Conjugate-13 07/28/2014  . Pneumococcal Polysaccharide-23 04/09/2012  . Td 07/13/2009    Past Medical History:  Diagnosis Date  .  Allergy   . Anemia   . Anxiety   . Arthritis    rheumatoid  . Asthma   . CAD (coronary artery disease)   . Cancer [facility])    Prostate  . Chronic sinusitis   . Depression   . Diverticulitis   . ED (erectile dysfunction)   . GERD (gastroesophageal reflux disease)   . History of SIADH   . Hx of adenomatous colonic polyps   . Hypertension   . Hyponatremia   . IBS (irritable bowel syndrome)   . Neurodermatitis   . Neurogenic bladder   . OSA (obstructive sleep apnea)     Tobacco History: History  Smoking Status  . Never Smoker  Smokeless Tobacco  . Never Used   Counseling given: Not Answered   Outpatient Encounter Prescriptions as of 12/26/2016  Medication Sig  . acetaminophen (TYLENOL) 325 MG tablet Take 2 tablets (650 mg total) by mouth every 6 (six) hours as needed for mild pain (or Fever >/= 101).  Marland Kitchen albuterol (PROVENTIL HFA;VENTOLIN HFA) 108 (90 Base) MCG/ACT inhaler Inhale 2 puffs into the lungs every 6 (six) hours as needed for wheezing or shortness of breath.  Marland Kitchen albuterol (PROVENTIL) (2.5 MG/3ML) 0.083% nebulizer solution Take 3 mLs (2.5 mg total) by nebulization every 6 (six) hours as needed for wheezing or shortness of breath.  . ALPRAZolam (XANAX) 0.5 MG tablet TAKE  1 TABLET BY MOUTH THREE TIMES DAILY AS NEEDED FOR ANXIETY  . amoxicillin-clavulanate (AUGMENTIN) 875-125 MG tablet Take 1 tablet by mouth every 12 (twelve) hours.  Marland Kitchen aspirin 81 MG tablet Take 81 mg by mouth daily.  . budesonide-formoterol (SYMBICORT) 160-4.5 MCG/ACT inhaler Inhale 2 puffs into the lungs 2 (two) times daily.  . Cholecalciferol (VITAMIN D3) 5000 UNITS CAPS Take 1 capsule by mouth daily.  . Coenzyme Q10 (CO Q 10) 100 MG CAPS Take 1 capsule by mouth daily.  . Cranberry 425 MG CAPS Take 2 capsules by mouth daily.  . fluticasone (FLONASE) 50 MCG/ACT nasal spray Place 2 sprays into both nostrils 2 (two) times daily.  Marland Kitchen FOLIC ACID PO Take [id number] mcg by mouth daily.   . furosemide (LASIX) 20 MG  tablet TAKE 1 TABLET BY MOUTH EVERY DAY  . gabapentin (NEURONTIN) 300 MG capsule TAKE 1 CAPSULE BY MOUTH THREE TIMES DAILY  . guaiFENesin (ROBITUSSIN) 100 MG/5ML SOLN Take 10 mLs (200 mg total) by mouth every 6 (six) hours as needed for cough or to loosen phlegm.  . hydrocortisone 2.5 % cream Apply topically 3 (three) times daily as needed.  . hydroxychloroquine (PLAQUENIL) 200 MG tablet Take 1 tablet by mouth 2 (two) times daily.  Marland Kitchen levofloxacin (LEVAQUIN) 500 MG tablet Take 500 mg by mouth daily.  . methotrexate (RHEUMATREX) 2.5 MG tablet Take 10 tablets by mouth once a week.  . metoprolol succinate (TOPROL-XL) 25 MG 24 hr tablet Take 1 tablet (25 mg total) by mouth daily.  . Milk Thistle 250 MG CAPS Take 1 capsule by mouth daily.  . Multiple Vitamin (MULTIVITAMIN) tablet Take 1 tablet by mouth daily.    Marland Kitchen omeprazole (PRILOSEC) 20 MG capsule TAKE 1 CAPSULE BY MOUTH EVERY DAY  . polyethylene glycol (GLYCOLAX) packet Take 34 g by mouth daily.   . potassium chloride SA (K-DUR,KLOR-CON) 20 MEQ tablet Take 2 tablets (40 mEq total) by mouth every other day.  . pravastatin (PRAVACHOL) 20 MG tablet TAKE 1 TABLET BY MOUTH EVERY DAY  . predniSONE (DELTASONE) 10 MG tablet 4 tabs for 2 days, then 3 tabs for 2 days, 2 tabs for 2 days, then 1 tab for 2 days, then stop  . predniSONE (DELTASONE) 20 MG tablet Take 40 mg by mouth daily with breakfast.  . Probiotic Product (ALIGN) 4 MG CAPS Take 1 capsule (4 mg total) by mouth daily.  . Selenium 200 MCG CAPS Take 1 capsule by mouth daily.  Marland Kitchen senna-docusate (SENOKOT-S) 8.6-50 MG tablet Take 1 tablet by mouth at bedtime as needed for mild constipation.  . solifenacin (VESICARE) 5 MG tablet Take 5 mg by mouth at bedtime. For bladder spasms  . traMADol (ULTRAM) 50 MG tablet TAKE 1 TABLET BY MOUTH FOUR TIMES DAILY AS NEEDED  . Turmeric 500 MG CAPS Take 500 mg by mouth daily.  . vitamin B-12 (CYANOCOBALAMIN) 500 MCG tablet Take 500 mcg by mouth daily.  .  [DISCONTINUED] budesonide-formoterol (SYMBICORT) 160-4.5 MCG/ACT inhaler Inhale 2 puffs into the lungs 2 (two) times daily.   Facility-Administered Encounter Medications as of 12/26/2016  Medication  . omalizumab Arvid Right) injection 375 mg  . omalizumab Arvid Right) injection 375 mg  . omalizumab Arvid Right) injection 375 mg     Review of Systems  Constitutional:   +  weight loss,  fatigue, or  lassitude.  HEENT:   No headaches,  Difficulty swallowing,  Tooth/dental problems, or  Sore throat,  No sneezing, itching, ear ache,  +nasal congestion, post nasal drip,   CV:  No chest pain,  Orthopnea, PND,   anasarca, dizziness, palpitations, syncope.   GI  No heartburn, indigestion, abdominal pain, nausea, vomiting, diarrhea, change in bowel habits, loss of appetite, bloody stools.   Resp: .  No chest wall deformity  Skin: no rash or lesions.  GU: no dysuria, change in color of urine, no urgency or frequency.  No flank pain, no hematuria   MS:  No joint pain or swelling.  No decreased range of motion.  No back pain.    Physical Exam  BP 110/60 (BP Location: Left Arm, Cuff Size: Normal)   Pulse 100   Temp 98.2 F (36.8 C) (Oral)   SpO2 92%   GEN: A/Ox3; pleasant , NAD, eldelry and chronically ill appearing    HEENT:  [place]/AT,  EACs-clear, TMs-wnl, NOSE-clear, THROAT-clear, no lesions, no postnasal drip or exudate noted.   NECK:  Supple w/ fair ROM; no JVD; normal carotid impulses w/o bruits; no thyromegaly or nodules palpated; no lymphadenopathy.    RESP  decreaed BS in bases . no accessory muscle use, no dullness to percussion  CARD:  RRR, no m/r/g, tr peripheral edema, pulses intact, no cyanosis or clubbing.  GI:   Soft & nt; nml bowel sounds; no organomegaly or masses detected.   Musco: Warm bil, no deformities or joint swelling noted.   Neuro: alert, no focal deficits noted.    Skin: Warm, no lesions or rashes  Psych:  No change in mood or affect. No  depression or anxiety.  No memory loss.  Lab Results:  CBC   BMET  BNP No results found for: BNP  ProBNP No results found for: PROBNP  Imaging: Dg Chest 2 View  Result Date: 12/26/2016 CLINICAL DATA:  Cough, shortness of breath, recent pneumonia EXAM: CHEST  2 VIEW COMPARISON:  12/14/2016 FINDINGS: Resolved right base atelectasis. Persistent left basilar streaky bandlike opacity which is posterior on the lateral view compatible with chronic atelectasis or scarring. Stable punctate calcified granuloma in the lingula. Normal heart size and vascularity. Trachea is midline. Atherosclerosis noted of the aorta. No superimposed edema or CHF. No enlarging effusion or pneumothorax. Degenerative changes of the spine with increased thoracic kyphosis. Advanced arthropathy of both shoulders. IMPRESSION: Resolved right base atelectasis Residual left lower lobe atelectasis versus scarring. Remote granulomatous disease Thoracic aortic atherosclerosis Electronically Signed   By: Jerilynn Mages.  Shick M.D.   On: 12/26/2016 15:51   Dg Chest 2 View  Result Date: 12/14/2016 CLINICAL DATA:  Followup pneumonia.  History of prostate cancer. EXAM: CHEST  2 VIEW COMPARISON:  Chest radiograph and CT chest December 11, 2016 FINDINGS: Similar bibasilar strandy densities. No pleural effusion or focal consolidation. Flattened hemidiaphragms. Cardiac silhouette is normal, mildly calcified aortic knob. Azygos fissure. Severe degenerative change of the shoulders. Accentuated thoracolumbar kyphosis. Soft tissue planes are nonsuspicious. IMPRESSION: Similar bibasilar atelectasis/ scarring. Electronically Signed   By: Elon Alas M.D.   On: 12/14/2016 14:33   Dg Chest 2 View  Result Date: 12/11/2016 CLINICAL DATA:  Fever, had pneumonia in December 2017, fatigue, history asthma, coronary artery disease, hypertension, GERD, prostate cancer, irritable bowel syndrome EXAM: CHEST  2 VIEW COMPARISON:  12/06/2016 FINDINGS: Upper normal  heart size. Atherosclerotic calcification aorta. Mediastinal contours and pulmonary vascularity normal. Azygos fissure noted. Emphysematous changes with bibasilar atelectasis greater on LEFT. Remaining lungs clear. No pleural effusion or pneumothorax. Bones demineralized with BILATERAL glenohumeral degenerative changes.  IMPRESSION: Bibasilar atelectasis LEFT greater than RIGHT. Aortic atherosclerosis. Electronically Signed   By: Lavonia Dana M.D.   On: 12/11/2016 15:49   Dg Chest 2 View  Result Date: 12/06/2016 CLINICAL DATA:  Follow-up of pneumonia. No current complaints. History of asthma, coronary artery disease, hypertension, and prostate malignancy. EXAM: CHEST  2 VIEW COMPARISON:  PA and lateral chest x-ray of November 14, 2016 FINDINGS: There is persistently increased density in the left lower lobe new since May of 2016. There is a small left pleural effusion. The right lung is clear. There is an azygos lobe anatomy on the right. The heart and pulmonary vascularity are normal. There is calcification in the wall of the aortic arch. There is severe degenerative change of both shoulders. There is an azygos lobe anatomy on the right IMPRESSION: Left basilar atelectasis or pneumonia unchanged since November 14, 2016, but it is new since the March 2016 study. Given the lack of significant improvement, chest CT scanning is recommended to further evaluate the lung parenchyma. Thoracic aortic atherosclerosis. Electronically Signed   By: David  Martinique M.D.   On: 12/06/2016 15:22   Ct Chest Wo Contrast  Result Date: 12/11/2016 CLINICAL DATA:  Persistent cough EXAM: CT CHEST WITHOUT CONTRAST TECHNIQUE: Multidetector CT imaging of the chest was performed following the standard protocol without IV contrast. COMPARISON:  Chest x-ray 12/11/2016, CT chest 07/11/2011 FINDINGS: Cardiovascular: Limited evaluation without intravenous contrast. Atherosclerotic vascular calcifications within the aorta and great vessels. No  aneurysmal dilatation. Coronary artery calcifications are present. Heart size upper normal. Trace pericardial fluid. Mediastinum/Nodes: Nonspecific subcentimeter lymph nodes in the mediastinum. Thyroid within normal limits. Trachea is midline. Asymmetric right esophageal thickening at the GE junction. There is mild asymmetric thickening of the left laryngeal tissues. Lungs/Pleura: Right azygos lobe. No significant pleural effusion. Stable calcified granuloma in the lingula. Hazy density in the posterior right lower lobe may reflect resolving infiltrate. Partial consolidation in the posterior left lower lobe. Upper Abdomen: Spleen slightly enlarged at 13 cm. Possible mild hydronephrosis upper pole right kidney. Musculoskeletal: Degenerative changes. No acute or suspicious bone lesions IMPRESSION: 1. Partial consolidation in the left lower lobe may reflect a mild pneumonia. Hazy densities in the posterior right lower lobe could reflect additional resolving infiltrate. 2. Mild asymmetric thickening of the right GE junction, consider endoscopy follow-up to exclude a mass 3. Asymmetric soft tissue thickening of the left larynx, could be further evaluated with direct visualization. 4. Slightly enlarged spleen 5. Possible mild hydronephrosis of visualized upper pole right kidney. Electronically Signed   By: Donavan Foil M.D.   On: 12/11/2016 19:06     Assessment & Plan:   Pneumonia LLL PNA -slow to resolve on cxr /CT chest  Hold on additional abx for now .    Plan  Patient Instructions  Prednisone taper over next week .  Mucinex DM Twice daily  As needed  Cough/congestion  Begin Symbicort 160/4.57mcg 2 puffs Twice daily , rinse after use  May use Proventil or Albuterol neb every 4hr as needed.  Follow up in 4 days , if better will try to restart Xolair .  follow up Dr. Halford Chessman  In 4-6 weeks with chest xray .  Please contact office for sooner follow up if symptoms do not improve or worsen or seek emergency  care      Asthma with acute exacerbation Flare off Xolair and with recent PNA   Plan  Patient Instructions  Prednisone taper over next week .  Mucinex  DM Twice daily  As needed  Cough/congestion  Begin Symbicort 160/4.65mcg 2 puffs Twice daily , rinse after use  May use Proventil or Albuterol neb every 4hr as needed.  Follow up in 4 days , if better will try to restart Xolair .  follow up Dr. Halford Chessman  In 4-6 weeks with chest xray .  Please contact office for sooner follow up if symptoms do not improve or worsen or seek emergency care         Rexene Edison, NP 12/27/2016

## 2016-12-26 NOTE — Patient Instructions (Addendum)
Prednisone taper over next week .  Mucinex DM Twice daily  As needed  Cough/congestion  Begin Symbicort 160/4.44mcg 2 puffs Twice daily , rinse after use  May use Proventil or Albuterol neb every 4hr as needed.  Follow up in 4 days , if better will try to restart Xolair .  follow up Dr. Halford Chessman  In 4-6 weeks with chest xray .  Please contact office for sooner follow up if symptoms do not improve or worsen or seek emergency care

## 2016-12-26 NOTE — Telephone Encounter (Signed)
Spoke with pt. He is wanting to know if he will need a CXR today. Per TP >> yes, pt will need a CXR. Order has been placed. He is also wanting to know if he would be able to get his Xolair injection today. TP will need to give him clearance for this since he was hospitalized. Nothing further was needed.

## 2016-12-27 NOTE — Assessment & Plan Note (Signed)
LLL PNA -slow to resolve on cxr /CT chest  Hold on additional abx for now .    Plan  Patient Instructions  Prednisone taper over next week .  Mucinex DM Twice daily  As needed  Cough/congestion  Begin Symbicort 160/4.60mcg 2 puffs Twice daily , rinse after use  May use Proventil or Albuterol neb every 4hr as needed.  Follow up in 4 days , if better will try to restart Xolair .  follow up Dr. Halford Chessman  In 4-6 weeks with chest xray .  Please contact office for sooner follow up if symptoms do not improve or worsen or seek emergency care

## 2016-12-27 NOTE — Assessment & Plan Note (Signed)
Flare off Xolair and with recent PNA   Plan  Patient Instructions  Prednisone taper over next week .  Mucinex DM Twice daily  As needed  Cough/congestion  Begin Symbicort 160/4.43mcg 2 puffs Twice daily , rinse after use  May use Proventil or Albuterol neb every 4hr as needed.  Follow up in 4 days , if better will try to restart Xolair .  follow up Dr. Halford Chessman  In 4-6 weeks with chest xray .  Please contact office for sooner follow up if symptoms do not improve or worsen or seek emergency care

## 2016-12-28 ENCOUNTER — Other Ambulatory Visit: Payer: Self-pay | Admitting: Internal Medicine

## 2016-12-28 NOTE — Progress Notes (Signed)
I have reviewed and agree with assessment/plan.  Chesley Mires, MD [facility] Pulmonary/Critical Care 12/28/2016, 3:41 PM Pager:  [phone]

## 2016-12-29 ENCOUNTER — Telehealth: Payer: Self-pay | Admitting: Adult Health

## 2016-12-29 NOTE — Telephone Encounter (Signed)
Left refill on voice mail at pharmacy  

## 2016-12-29 NOTE — Telephone Encounter (Signed)
Last filled 11-22-16 #90 Last OV 12-19-16 Next OV 12-2616

## 2016-12-29 NOTE — Telephone Encounter (Signed)
TP   Pt. Called in today and wanted to let you know that he is feeling better and would like to go ahead with getting his shot after his appointment with you tomorrow    3. Melvenia Needles, NP (Nurse Practitioner) at 12/26/2016 4:28 PM - Signed    Prednisone taper over next week .  Mucinex DM Twice daily  As needed  Cough/congestion  Begin Symbicort 160/4.52mcg 2 puffs Twice daily , rinse after use  May use Proventil or Albuterol neb every 4hr as needed.  Follow up in 4 days , if better will try to restart Xolair .  follow up Dr. Halford Chessman  In 4-6 weeks with chest xray .  Please contact office for sooner follow up if symptoms do not improve or worsen or seek emergency care

## 2016-12-29 NOTE — Telephone Encounter (Signed)
Approved: #[id number] x 0 

## 2016-12-29 NOTE — Telephone Encounter (Signed)
Spoke with pt. He has been scheduled for his Xolair injection. Nothing further was needed.

## 2016-12-29 NOTE — Telephone Encounter (Signed)
That is great , please let Alroy Bailiff know that for his Xolair injection.

## 2016-12-30 ENCOUNTER — Encounter: Payer: Self-pay | Admitting: Adult Health

## 2016-12-30 ENCOUNTER — Ambulatory Visit: Payer: Medicare PPO | Admitting: Adult Health

## 2016-12-30 ENCOUNTER — Ambulatory Visit (INDEPENDENT_AMBULATORY_CARE_PROVIDER_SITE_OTHER): Payer: Medicare PPO | Admitting: Adult Health

## 2016-12-30 ENCOUNTER — Ambulatory Visit (INDEPENDENT_AMBULATORY_CARE_PROVIDER_SITE_OTHER): Payer: Medicare PPO

## 2016-12-30 DIAGNOSIS — J4550 Severe persistent asthma, uncomplicated: Secondary | ICD-10-CM

## 2016-12-30 DIAGNOSIS — J4520 Mild intermittent asthma, uncomplicated: Secondary | ICD-10-CM

## 2016-12-30 DIAGNOSIS — J181 Lobar pneumonia, unspecified organism: Secondary | ICD-10-CM | POA: Diagnosis not present

## 2016-12-30 NOTE — Patient Instructions (Signed)
Mucinex DM Twice daily  As needed  Cough/congestion  Continue on Symbicort 160/4.99mcg 2 puffs Twice daily , rinse after use  May use Proventil or Albuterol neb every 4hr as needed.  Follow up Dr. Halford Chessman  In 4-6 weeks with chest xray .  Please contact office for sooner follow up if symptoms do not improve or worsen or seek emergency care

## 2016-12-30 NOTE — Progress Notes (Signed)
@Patient  ID: [name], male    DOB: 05/22/1934, 83 y.o.   MRN: [id number]  No chief complaint on file.   Referring provider: [name], MD  HPI: 83 yo male never smoker followed for moderate persistent Allergic Asthma on Xolair  RA on MTX /Plaquenil Neurogenic bladder w/ chronic foley   TEST  PFT 04/07/10 >> FEV1 2.61 (86%), FEV1% 73, TLC 7.26 (85%), TLC 112%  12/30/2016 Follow up : PNA/Asthma  Patient returns for a four-day follow-up. Patient has recently been recovering from a left lower lobe pneumonia. He was initially treated with Omnicef, then 10 days of Levaquin. Unfortunately, he was admitted 2 weeks ago for a Klebsiella UTI. He received IV antibiotics and discharged on 10 days of Augmentin. He returned to the office earlier this week with persistent cough and wheezing. He's been off of his Xolair injections for several weeks due to illness. Patient was given a prednisone taper and started on Symbicort. CT chest on January 14 showed partial consolidation of the left lower lobe with hazy densities in the right lower lobe. Chest x-ray on 129 showed resolved right lower lobe and residual left lower lobe atelectasis versus scarring Patient returns today feeling much improved. Says his cough and wheezing have almost totally resolved. Patient strength is starting to improve. His appetite has picked up. He is taking in ensure.     Allergies  Allergen Reactions  . Ciprofloxacin Nausea And Vomiting    Headache  . Citalopram Hydrobromide Other (See Comments)    "unknown"  . Lorazepam     Adverse reaction  . Paroxetine Nausea Only  . Ramipril Other (See Comments)    "unknown"  . Simvastatin     Immunization History  Administered Date(s) Administered  . Influenza Split 09/26/2011  . Influenza Whole 09/07/2007, 08/21/2008, 09/01/2009, 08/23/2010  . Influenza,inj,Quad PF,36+ Mos 09/04/2013, 07/28/2014, 08/20/2015, 08/08/2016  . Influenza-Unspecified 09/06/2012  .  Pneumococcal Conjugate-13 07/28/2014  . Pneumococcal Polysaccharide-23 04/09/2012  . Td 07/13/2009    Past Medical History:  Diagnosis Date  . Allergy   . Anemia   . Anxiety   . Arthritis    rheumatoid  . Asthma   . CAD (coronary artery disease)   . Cancer [facility])    Prostate  . Chronic sinusitis   . Depression   . Diverticulitis   . ED (erectile dysfunction)   . GERD (gastroesophageal reflux disease)   . History of SIADH   . Hx of adenomatous colonic polyps   . Hypertension   . Hyponatremia   . IBS (irritable bowel syndrome)   . Neurodermatitis   . Neurogenic bladder   . OSA (obstructive sleep apnea)     Tobacco History: History  Smoking Status  . Never Smoker  Smokeless Tobacco  . Never Used   Counseling given: Not Answered   Outpatient Encounter Prescriptions as of 12/30/2016  Medication Sig  . acetaminophen (TYLENOL) 325 MG tablet Take 2 tablets (650 mg total) by mouth every 6 (six) hours as needed for mild pain (or Fever >/= 101).  Marland Kitchen albuterol (PROVENTIL HFA;VENTOLIN HFA) 108 (90 Base) MCG/ACT inhaler Inhale 2 puffs into the lungs every 6 (six) hours as needed for wheezing or shortness of breath.  Marland Kitchen albuterol (PROVENTIL) (2.5 MG/3ML) 0.083% nebulizer solution Take 3 mLs (2.5 mg total) by nebulization every 6 (six) hours as needed for wheezing or shortness of breath.  . ALPRAZolam (XANAX) 0.5 MG tablet TAKE 1 TABLET BY MOUTH THREE TIMES DAILY AS  NEEDED FOR ANXIETY  . amoxicillin-clavulanate (AUGMENTIN) 875-125 MG tablet Take 1 tablet by mouth every 12 (twelve) hours.  Marland Kitchen aspirin 81 MG tablet Take 81 mg by mouth daily.  . budesonide-formoterol (SYMBICORT) 160-4.5 MCG/ACT inhaler Inhale 2 puffs into the lungs 2 (two) times daily.  . Cholecalciferol (VITAMIN D3) 5000 UNITS CAPS Take 1 capsule by mouth daily.  . Coenzyme Q10 (CO Q 10) 100 MG CAPS Take 1 capsule by mouth daily.  . Cranberry 425 MG CAPS Take 2 capsules by mouth daily.  . fluticasone (FLONASE) 50 MCG/ACT  nasal spray Place 2 sprays into both nostrils 2 (two) times daily.  Marland Kitchen FOLIC ACID PO Take XX123456 mcg by mouth daily.   . furosemide (LASIX) 20 MG tablet TAKE 1 TABLET BY MOUTH EVERY DAY  . gabapentin (NEURONTIN) 300 MG capsule TAKE 1 CAPSULE BY MOUTH THREE TIMES DAILY  . guaiFENesin (ROBITUSSIN) 100 MG/5ML SOLN Take 10 mLs (200 mg total) by mouth every 6 (six) hours as needed for cough or to loosen phlegm.  . hydrocortisone 2.5 % cream Apply topically 3 (three) times daily as needed.  . hydroxychloroquine (PLAQUENIL) 200 MG tablet Take 1 tablet by mouth 2 (two) times daily.  Marland Kitchen levofloxacin (LEVAQUIN) 500 MG tablet Take 500 mg by mouth daily.  . methotrexate (RHEUMATREX) 2.5 MG tablet Take 10 tablets by mouth once a week.  . metoprolol succinate (TOPROL-XL) 25 MG 24 hr tablet Take 1 tablet (25 mg total) by mouth daily.  . Milk Thistle 250 MG CAPS Take 1 capsule by mouth daily.  . Multiple Vitamin (MULTIVITAMIN) tablet Take 1 tablet by mouth daily.    Marland Kitchen omeprazole (PRILOSEC) 20 MG capsule TAKE 1 CAPSULE BY MOUTH EVERY DAY  . polyethylene glycol (GLYCOLAX) packet Take 34 g by mouth daily.   . potassium chloride SA (K-DUR,KLOR-CON) 20 MEQ tablet Take 2 tablets (40 mEq total) by mouth every other day.  . pravastatin (PRAVACHOL) 20 MG tablet TAKE 1 TABLET BY MOUTH EVERY DAY  . predniSONE (DELTASONE) 10 MG tablet 4 tabs for 2 days, then 3 tabs for 2 days, 2 tabs for 2 days, then 1 tab for 2 days, then stop  . predniSONE (DELTASONE) 20 MG tablet Take 40 mg by mouth daily with breakfast.  . Probiotic Product (ALIGN) 4 MG CAPS Take 1 capsule (4 mg total) by mouth daily.  . Selenium 200 MCG CAPS Take 1 capsule by mouth daily.  Marland Kitchen senna-docusate (SENOKOT-S) 8.6-50 MG tablet Take 1 tablet by mouth at bedtime as needed for mild constipation.  . solifenacin (VESICARE) 5 MG tablet Take 5 mg by mouth at bedtime. For bladder spasms  . traMADol (ULTRAM) 50 MG tablet TAKE 1 TABLET BY MOUTH FOUR TIMES DAILY AS NEEDED    . Turmeric 500 MG CAPS Take 500 mg by mouth daily.  . vitamin B-12 (CYANOCOBALAMIN) 500 MCG tablet Take 500 mcg by mouth daily.   Facility-Administered Encounter Medications as of 12/30/2016  Medication  . omalizumab Arvid Right) injection 375 mg  . omalizumab Arvid Right) injection 375 mg  . omalizumab Arvid Right) injection 375 mg     Review of Systems  Constitutional:   No  weight loss, night sweats,  Fevers, chills,  +fatigue, or  lassitude.  HEENT:   No headaches,  Difficulty swallowing,  Tooth/dental problems, or  Sore throat,                No sneezing, itching, ear ache, + nasal congestion, post nasal drip,   CV:  No chest pain,  Orthopnea, PND, swelling in lower extremities, anasarca, dizziness, palpitations, syncope.   GI  No heartburn, indigestion, abdominal pain, nausea, vomiting, diarrhea, change in bowel habits, loss of appetite, bloody stools.   Resp:    No wheezing.  No chest wall deformity  Skin: no rash or lesions.  GU: no dysuria, change in color of urine, no urgency or frequency.  No flank pain, no hematuria   MS:  No joint pain or swelling.  No decreased range of motion.  No back pain.    Physical Exam  BP 126/80 (BP Location: Left Arm, Cuff Size: Normal)   Pulse 97   Ht 5\' 11"  (1.803 m)   Wt 210 lb 12.8 oz (95.6 kg)   SpO2 94%   BMI 29.40 kg/m   GEN: A/Ox3; pleasant , NAD , elderly , frail with walker    HEENT:  [place]/AT,  EACs-clear, TMs-wnl, NOSE-clear, THROAT-clear, no lesions, no postnasal drip or exudate noted.   NECK:  Supple w/ fair ROM; no JVD; normal carotid impulses w/o bruits; no thyromegaly or nodules palpated; no lymphadenopathy.    RESP  CT [place]  P & A; w/o, wheezes/ rales/ or rhonchi. no accessory muscle use, no dullness to percussion  CARD:  RRR, no m/r/g, no peripheral edema, pulses intact, no cyanosis or clubbing.  GI:   Soft & nt; nml bowel sounds; no organomegaly or masses detected.   Musco: Warm bil, no deformities or joint swelling  noted.   Neuro: alert, no focal deficits noted.    Skin: Warm, no lesions or rashes  Psych:  No change in mood or affect. No depression or anxiety.  No memory loss.  Lab Results:  CBC Imaging: Dg Chest 2 View  Result Date: 12/26/2016 CLINICAL DATA:  Cough, shortness of breath, recent pneumonia EXAM: CHEST  2 VIEW COMPARISON:  12/14/2016 FINDINGS: Resolved right base atelectasis. Persistent left basilar streaky bandlike opacity which is posterior on the lateral view compatible with chronic atelectasis or scarring. Stable punctate calcified granuloma in the lingula. Normal heart size and vascularity. Trachea is midline. Atherosclerosis noted of the aorta. No superimposed edema or CHF. No enlarging effusion or pneumothorax. Degenerative changes of the spine with increased thoracic kyphosis. Advanced arthropathy of both shoulders. IMPRESSION: Resolved right base atelectasis Residual left lower lobe atelectasis versus scarring. Remote granulomatous disease Thoracic aortic atherosclerosis Electronically Signed   By: Jerilynn Mages.  Shick M.D.   On: 12/26/2016 15:51   Dg Chest 2 View  Result Date: 12/14/2016 CLINICAL DATA:  Followup pneumonia.  History of prostate cancer. EXAM: CHEST  2 VIEW COMPARISON:  Chest radiograph and CT chest December 11, 2016 FINDINGS: Similar bibasilar strandy densities. No pleural effusion or focal consolidation. Flattened hemidiaphragms. Cardiac silhouette is normal, mildly calcified aortic knob. Azygos fissure. Severe degenerative change of the shoulders. Accentuated thoracolumbar kyphosis. Soft tissue planes are nonsuspicious. IMPRESSION: Similar bibasilar atelectasis/ scarring. Electronically Signed   By: Elon Alas M.D.   On: 12/14/2016 14:33   Dg Chest 2 View  Result Date: 12/11/2016 CLINICAL DATA:  Fever, had pneumonia in December 2017, fatigue, history asthma, coronary artery disease, hypertension, GERD, prostate cancer, irritable bowel syndrome EXAM: CHEST  2 VIEW  COMPARISON:  12/06/2016 FINDINGS: Upper normal heart size. Atherosclerotic calcification aorta. Mediastinal contours and pulmonary vascularity normal. Azygos fissure noted. Emphysematous changes with bibasilar atelectasis greater on LEFT. Remaining lungs clear. No pleural effusion or pneumothorax. Bones demineralized with BILATERAL glenohumeral degenerative changes. IMPRESSION: Bibasilar atelectasis LEFT greater than RIGHT.  Aortic atherosclerosis. Electronically Signed   By: Lavonia Dana M.D.   On: 12/11/2016 15:49   Dg Chest 2 View  Result Date: 12/06/2016 CLINICAL DATA:  Follow-up of pneumonia. No current complaints. History of asthma, coronary artery disease, hypertension, and prostate malignancy. EXAM: CHEST  2 VIEW COMPARISON:  PA and lateral chest x-ray of November 14, 2016 FINDINGS: There is persistently increased density in the left lower lobe new since May of 2016. There is a small left pleural effusion. The right lung is clear. There is an azygos lobe anatomy on the right. The heart and pulmonary vascularity are normal. There is calcification in the wall of the aortic arch. There is severe degenerative change of both shoulders. There is an azygos lobe anatomy on the right IMPRESSION: Left basilar atelectasis or pneumonia unchanged since November 14, 2016, but it is new since the March 2016 study. Given the lack of significant improvement, chest CT scanning is recommended to further evaluate the lung parenchyma. Thoracic aortic atherosclerosis. Electronically Signed   By: David  Martinique M.D.   On: 12/06/2016 15:22   Ct Chest Wo Contrast  Result Date: 12/11/2016 CLINICAL DATA:  Persistent cough EXAM: CT CHEST WITHOUT CONTRAST TECHNIQUE: Multidetector CT imaging of the chest was performed following the standard protocol without IV contrast. COMPARISON:  Chest x-ray 12/11/2016, CT chest 07/11/2011 FINDINGS: Cardiovascular: Limited evaluation without intravenous contrast. Atherosclerotic vascular  calcifications within the aorta and great vessels. No aneurysmal dilatation. Coronary artery calcifications are present. Heart size upper normal. Trace pericardial fluid. Mediastinum/Nodes: Nonspecific subcentimeter lymph nodes in the mediastinum. Thyroid within normal limits. Trachea is midline. Asymmetric right esophageal thickening at the GE junction. There is mild asymmetric thickening of the left laryngeal tissues. Lungs/Pleura: Right azygos lobe. No significant pleural effusion. Stable calcified granuloma in the lingula. Hazy density in the posterior right lower lobe may reflect resolving infiltrate. Partial consolidation in the posterior left lower lobe. Upper Abdomen: Spleen slightly enlarged at 13 cm. Possible mild hydronephrosis upper pole right kidney. Musculoskeletal: Degenerative changes. No acute or suspicious bone lesions IMPRESSION: 1. Partial consolidation in the left lower lobe may reflect a mild pneumonia. Hazy densities in the posterior right lower lobe could reflect additional resolving infiltrate. 2. Mild asymmetric thickening of the right GE junction, consider endoscopy follow-up to exclude a mass 3. Asymmetric soft tissue thickening of the left larynx, could be further evaluated with direct visualization. 4. Slightly enlarged spleen 5. Possible mild hydronephrosis of visualized upper pole right kidney. Electronically Signed   By: Donavan Foil M.D.   On: 12/11/2016 19:06     Assessment & Plan:   No problem-specific Assessment & Plan notes found for this encounter.     Rexene Edison, NP 12/30/2016

## 2016-12-30 NOTE — Assessment & Plan Note (Signed)
Recent flare now improved after steroids and Symbiocrt  May restart Xolair today   Plan  Patient Instructions  Mucinex DM Twice daily  As needed  Cough/congestion  Continue on Symbicort 160/4.69mcg 2 puffs Twice daily , rinse after use  May use Proventil or Albuterol neb every 4hr as needed.  Follow up Dr. Halford Chessman  In 4-6 weeks with chest xray .  Please contact office for sooner follow up if symptoms do not improve or worsen or seek emergency care

## 2016-12-30 NOTE — Assessment & Plan Note (Signed)
Clinically improving  Will repeat cXR on return in 6 weeks

## 2016-12-31 NOTE — Progress Notes (Signed)
I have reviewed and agree with assessment/plan.  Chesley Mires, MD [facility] Pulmonary/Critical Care 12/31/2016, 11:51 PM Pager:  [phone]

## 2017-01-02 ENCOUNTER — Telehealth: Payer: Self-pay | Admitting: Pulmonary Disease

## 2017-01-02 NOTE — Telephone Encounter (Signed)
Called and spoke with patient, states that he received a bill for $5,000.00 regarding his Xolair services.  Pt is wanting to speak to [name] specifically as she has met with him and his daughter recently to discuss his Xolair account issues. Will send to [facility] to address this as soon as she is back in the office. Pt aware that she is out right now and we are not sure when she will return this week. Pt states that he is okay with waiting because he prefers to speak directly to her.  Please advise [name]. Thanks.

## 2017-01-03 ENCOUNTER — Ambulatory Visit: Payer: Medicare PPO | Admitting: Adult Health

## 2017-01-03 MED ORDER — OMALIZUMAB 150 MG ~~LOC~~ SOLR
375.0000 mg | SUBCUTANEOUS | Status: DC
  Administered 2016-12-30: 375 mg via SUBCUTANEOUS

## 2017-01-03 NOTE — Progress Notes (Signed)
Documentation of medication administration of Xolair and charges have been completed by Desmond Dike, CMA based on the [place] documentation sheet completed by [facility].   [name] came in on 12/30/2016 to receive a Xolair injection. The patient is given 375mg  every 14 days. Due to each vial equalling 150mg , 75mg  of medication was wasted.

## 2017-01-06 ENCOUNTER — Ambulatory Visit: Payer: Medicare PPO | Admitting: Adult Health

## 2017-01-06 NOTE — Progress Notes (Signed)
[facility]  Patient Status: not taken under care, patient's wife has refused multiple attempts 1/18, 1/22, 1/29, 2/16 to admit patient. Informed Marshell Garfinkel, CM.    If patient discharges after hours, please call [phone].   Florene Glen 01/06/2017, 4:42 PM

## 2017-01-11 DIAGNOSIS — N319 Neuromuscular dysfunction of bladder, unspecified: Secondary | ICD-10-CM | POA: Diagnosis not present

## 2017-01-12 NOTE — Telephone Encounter (Signed)
Patient wishes to speak with [name] only.   KW please advise.  Thanks.

## 2017-01-12 NOTE — Telephone Encounter (Signed)
[place] back [phone]

## 2017-01-13 NOTE — Telephone Encounter (Signed)
Patient called back and would like to speak to Joellen Jersey - pt can be at [phone] - pr

## 2017-01-16 ENCOUNTER — Encounter: Payer: Self-pay | Admitting: Internal Medicine

## 2017-01-16 ENCOUNTER — Ambulatory Visit: Payer: Medicare PPO

## 2017-01-16 ENCOUNTER — Ambulatory Visit (INDEPENDENT_AMBULATORY_CARE_PROVIDER_SITE_OTHER): Payer: Medicare PPO | Admitting: Internal Medicine

## 2017-01-16 VITALS — BP 114/66 | HR 73 | Temp 98.2°F | Wt 215.8 lb

## 2017-01-16 DIAGNOSIS — E871 Hypo-osmolality and hyponatremia: Secondary | ICD-10-CM

## 2017-01-16 DIAGNOSIS — J4550 Severe persistent asthma, uncomplicated: Secondary | ICD-10-CM | POA: Diagnosis not present

## 2017-01-16 DIAGNOSIS — F39 Unspecified mood [affective] disorder: Secondary | ICD-10-CM

## 2017-01-16 LAB — RENAL FUNCTION PANEL
Albumin: 3.7 g/dL (ref 3.5–5.2)
BUN: 17 mg/dL (ref 6–23)
CO2: 34 mEq/L — ABNORMAL HIGH (ref 19–32)
CREATININE: 0.68 mg/dL (ref 0.40–1.50)
Calcium: 9 mg/dL (ref 8.4–10.5)
Chloride: 98 mEq/L (ref 96–112)
GFR: 119.06 mL/min (ref >60.00)
GLUCOSE: 92 mg/dL (ref 70–99)
PHOSPHORUS: 3.6 mg/dL (ref 2.3–4.6)
POTASSIUM: 4.5 meq/L (ref 3.5–5.1)
SODIUM: 137 meq/L (ref 135–145)

## 2017-01-16 LAB — CBC WITH DIFFERENTIAL/PLATELET
BASOS ABS: 0.1 10*3/uL (ref 0.0–0.1)
Basophils Relative: 0.9 % (ref 0.0–3.0)
EOS ABS: 0.2 10*3/uL (ref 0.0–0.7)
Eosinophils Relative: 2.9 % (ref 0.0–5.0)
HEMATOCRIT: 37.4 % — AB (ref 39.0–52.0)
HEMOGLOBIN: 12.9 g/dL — AB (ref 13.0–17.0)
LYMPHS PCT: 19.6 % (ref 12.0–46.0)
Lymphs Abs: 1.1 10*3/uL (ref 0.7–4.0)
MCHC: 34.5 g/dL (ref 30.0–36.0)
MCV: 95.9 fl (ref 78.0–100.0)
Monocytes Absolute: 0.3 10*3/uL (ref 0.1–1.0)
Monocytes Relative: 6 % (ref 3.0–12.0)
Neutro Abs: 4.1 10*3/uL (ref 1.4–7.7)
Neutrophils Relative %: 70.6 % (ref 43.0–77.0)
Platelets: 170 10*3/uL (ref 150.0–400.0)
RBC: 3.9 Mil/uL — ABNORMAL LOW (ref 4.22–5.81)
RDW: 15 % (ref 11.5–15.5)
WBC: 5.8 10*3/uL (ref 4.0–10.5)

## 2017-01-16 MED ORDER — SILVER SULFADIAZINE 1 % EX CREA: 1.0000 "application " | TOPICAL_CREAM | Freq: Every day | CUTANEOUS | 1 refills | Status: DC

## 2017-01-16 NOTE — Assessment & Plan Note (Signed)
Chronic anxiety but less noticeable since he gained weight and breathing is better

## 2017-01-16 NOTE — Progress Notes (Signed)
Pre visit review using our clinic review tool, if applicable. No additional management support is needed unless otherwise documented below in the visit note. 

## 2017-01-16 NOTE — Progress Notes (Signed)
Subjective:    Patient ID: [name], male    DOB: December 13, 1933, 83 y.o.   MRN: [id number]  HPI Here with daughter for follow up of breathing problems He is less anxious now-- drinking 1-2 boost a day and gained back some weight Appetite is not that great--but better now that he is back on xolair and asthma is controlled Seems to get full quickly No dysphagia  Cough is gone Breathing is back to baseline Still anxious about his labs  Persistent mild hyponatremia Continues on the furosemide Was given KCl--- couldn't tolerate so he stopped   Gets itching on legs Had cream in past that helped---shows me silvadene cream This is effective---he uses it sparingly  Current Outpatient Prescriptions on File Prior to Visit  Medication Sig Dispense Refill  . acetaminophen (TYLENOL) 325 MG tablet Take 2 tablets (650 mg total) by mouth every 6 (six) hours as needed for mild pain (or Fever >/= 101).    Marland Kitchen albuterol (PROVENTIL HFA;VENTOLIN HFA) 108 (90 Base) MCG/ACT inhaler Inhale 2 puffs into the lungs every 6 (six) hours as needed for wheezing or shortness of breath. 1 Inhaler 3  . ALPRAZolam (XANAX) 0.5 MG tablet TAKE 1 TABLET BY MOUTH THREE TIMES DAILY AS NEEDED FOR ANXIETY 90 tablet 0  . aspirin 81 MG tablet Take 81 mg by mouth daily.    . Cholecalciferol (VITAMIN D3) 5000 UNITS CAPS Take 1 capsule by mouth daily.    . Coenzyme Q10 (CO Q 10) 100 MG CAPS Take 1 capsule by mouth daily.    . Cranberry 425 MG CAPS Take 2 capsules by mouth daily.    . fluticasone (FLONASE) 50 MCG/ACT nasal spray Place 2 sprays into both nostrils 2 (two) times daily.    Marland Kitchen FOLIC ACID PO Take [id number] mcg by mouth daily.     . furosemide (LASIX) 20 MG tablet TAKE 1 TABLET BY MOUTH EVERY DAY 30 tablet 11  . gabapentin (NEURONTIN) 300 MG capsule TAKE 1 CAPSULE BY MOUTH THREE TIMES DAILY 90 capsule 11  . hydrocortisone 2.5 % cream Apply topically 3 (three) times daily as needed. 28 g 3  . hydroxychloroquine (PLAQUENIL)  200 MG tablet Take 1 tablet by mouth 2 (two) times daily.    . methotrexate (RHEUMATREX) 2.5 MG tablet Take 10 tablets by mouth once a week.    . Milk Thistle 250 MG CAPS Take 1 capsule by mouth daily.    . Multiple Vitamin (MULTIVITAMIN) tablet Take 1 tablet by mouth daily.      Marland Kitchen omeprazole (PRILOSEC) 20 MG capsule TAKE 1 CAPSULE BY MOUTH EVERY DAY 90 capsule 3  . polyethylene glycol (GLYCOLAX) packet Take 34 g by mouth daily.     . potassium chloride SA (K-DUR,KLOR-CON) 20 MEQ tablet Take 2 tablets (40 mEq total) by mouth every other day. 15 tablet 0  . pravastatin (PRAVACHOL) 20 MG tablet TAKE 1 TABLET BY MOUTH EVERY DAY 90 tablet 1  . Probiotic Product (ALIGN) 4 MG CAPS Take 1 capsule (4 mg total) by mouth daily. 15 capsule 0  . Selenium 200 MCG CAPS Take 1 capsule by mouth daily.    . solifenacin (VESICARE) 5 MG tablet Take 5 mg by mouth at bedtime. For bladder spasms    . traMADol (ULTRAM) 50 MG tablet TAKE 1 TABLET BY MOUTH FOUR TIMES DAILY AS NEEDED 120 tablet 0  . Turmeric 500 MG CAPS Take 500 mg by mouth daily.    . vitamin B-12 (CYANOCOBALAMIN)  500 MCG tablet Take 500 mcg by mouth daily.     Current Facility-Administered Medications on File Prior to Visit  Medication Dose Route Frequency Provider Last Rate Last Dose  . omalizumab Arvid Right) injection 375 mg  375 mg Subcutaneous Q14 Days Chesley Mires, MD   375 mg at 09/28/16 1203  . omalizumab Arvid Right) injection 375 mg  375 mg Subcutaneous Q14 Days Chesley Mires, MD   375 mg at 10/13/16 1151  . omalizumab Arvid Right) injection 375 mg  375 mg Subcutaneous Q14 Days Chesley Mires, MD   375 mg at 10/31/16 1538  . omalizumab Arvid Right) injection 375 mg  375 mg Subcutaneous Q14 Days Chesley Mires, MD   375 mg at 12/30/16 0800    Allergies  Allergen Reactions  . Ciprofloxacin Nausea And Vomiting    Headache  . Citalopram Hydrobromide Other (See Comments)    "unknown"  . Lorazepam     Adverse reaction  . Paroxetine Nausea Only  . Ramipril Other  (See Comments)    "unknown"  . Simvastatin     Past Medical History:  Diagnosis Date  . Allergy   . Anemia   . Anxiety   . Arthritis    rheumatoid  . Asthma   . CAD (coronary artery disease)   . Cancer [facility])    Prostate  . Chronic sinusitis   . Depression   . Diverticulitis   . ED (erectile dysfunction)   . GERD (gastroesophageal reflux disease)   . History of SIADH   . Hx of adenomatous colonic polyps   . Hypertension   . Hyponatremia   . IBS (irritable bowel syndrome)   . Neurodermatitis   . Neurogenic bladder   . OSA (obstructive sleep apnea)     Past Surgical History:  Procedure Laterality Date  . KNEE ARTHROPLASTY Right 09/02/2015   Procedure: COMPUTER ASSISTED TOTAL KNEE ARTHROPLASTY;  Surgeon: [name], MD;  Location: [facility] ORS;  Service: Orthopedics;  Laterality: Right;  . KNEE SURGERY Left    ARTHROSCOPY LEFT  . NASAL SINUS SURGERY  2009   DEVIATED SEPTUM AND POLYPS  . PROSTATE SURGERY     PROSTATECTOMY  . TOTAL KNEE ARTHROPLASTY Left 9/15   Dr [name]  . URETHRAL STRICTURE DILATATION  02-2010   Dr.Cope    Family History  Problem Relation Age of Onset  . Heart disease Mother 73    heart failure  . Pneumonia Father 2    pnemonia  . Colon cancer Neg Hx     Social History   Social History  . Marital status: Married    Spouse name: N/A  . Number of children: 2  . Years of education: N/A   Occupational History  . Radio station owner     retired   Social History Main Topics  . Smoking status: Never Smoker  . Smokeless tobacco: Never Used  . Alcohol use 7.2 oz/week    10 Standard drinks or equivalent, 2 Shots of liquor per week  . Drug use: No  . Sexual activity: No   Other Topics Concern  . Not on file   Social History Narrative   Not sure about a living will or health care POA   Wife should make health care decisions for him   Would accept resuscitation attempts   Not sure about tube feeds   Review of Systems Has some hot  flashes--goes back 2- 3 months Will have some flushing in face also Has stopped martinis--- now has some  red wine and enjoys this Interested in less expensive alternative to vesicare    Objective:   Physical Exam  Constitutional: No distress.  Cardiovascular: Normal rate, regular rhythm and normal heart sounds.  Exam reveals no gallop.   No murmur heard. Pulmonary/Chest: Effort normal and breath sounds normal. No respiratory distress. He has no wheezes. He has no rales.  Musculoskeletal: He exhibits no edema or tenderness.  Psychiatric: He has a normal mood and affect. His behavior is normal.  Typical anxious self--but calmer than last time          Assessment & Plan:

## 2017-01-16 NOTE — Telephone Encounter (Signed)
Spoke with pt, who states he has some questions in regards to his [employer]. Will route to [name] to f/u on.

## 2017-01-16 NOTE — Assessment & Plan Note (Signed)
Better since going back on xolair

## 2017-01-16 NOTE — Assessment & Plan Note (Signed)
Chronic On the furosemide for this Will recheck No longer taking the salt

## 2017-01-16 NOTE — Telephone Encounter (Signed)
I have [name] looking into this for me and will update patient once I have correct information.

## 2017-01-17 ENCOUNTER — Ambulatory Visit (INDEPENDENT_AMBULATORY_CARE_PROVIDER_SITE_OTHER): Payer: Medicare PPO

## 2017-01-17 DIAGNOSIS — J4520 Mild intermittent asthma, uncomplicated: Secondary | ICD-10-CM

## 2017-01-18 MED ORDER — OMALIZUMAB 150 MG ~~LOC~~ SOLR
375.0000 mg | SUBCUTANEOUS | Status: DC
  Administered 2017-01-17: 375 mg via SUBCUTANEOUS

## 2017-01-18 NOTE — Progress Notes (Addendum)
Documentation of medication administration of Xolair and charges have been completed by Desmond Dike, CMA based on the [place] documentation sheet completed by [facility].   [name] came in on 01/17/2017 to receive a Xolair injection. The patient is given 375mg  every 14 days. Due to each vial equalling 150mg , 75mg  of medication was wasted.

## 2017-01-20 NOTE — Telephone Encounter (Signed)
Please advise on update. Thanks.  

## 2017-01-20 NOTE — Telephone Encounter (Signed)
Spoke with patient yesterday evening; he is aware of billing's phone number and cost for his Xolair injections last year. Nothing more needed at this time.

## 2017-01-23 ENCOUNTER — Ambulatory Visit: Payer: Medicare PPO | Admitting: Internal Medicine

## 2017-01-23 ENCOUNTER — Telehealth: Payer: Self-pay | Admitting: Pulmonary Disease

## 2017-01-23 NOTE — Telephone Encounter (Signed)
#   vials:6 Ordered date:01/23/17 Shipping Date:01/23/17

## 2017-01-24 NOTE — Telephone Encounter (Signed)
#   Vials:6 Arrival Date:01/24/17 Lot [id number] Exp Date:9/21

## 2017-01-25 ENCOUNTER — Other Ambulatory Visit: Payer: Self-pay | Admitting: Internal Medicine

## 2017-01-27 ENCOUNTER — Other Ambulatory Visit: Payer: Self-pay | Admitting: Internal Medicine

## 2017-01-27 NOTE — Telephone Encounter (Signed)
Rx called in to requested pharmacy 

## 2017-01-27 NOTE — Telephone Encounter (Signed)
Last filled 10/24/16 #120. Last seen 12/2016

## 2017-01-27 NOTE — Telephone Encounter (Signed)
Approved: #[id number] x 0 

## 2017-01-27 NOTE — Telephone Encounter (Signed)
Last filled 12/29/2016 #90. Last OV 12/2016

## 2017-01-27 NOTE — Telephone Encounter (Signed)
Approved: #[id number] x 0 

## 2017-02-02 DIAGNOSIS — N319 Neuromuscular dysfunction of bladder, unspecified: Secondary | ICD-10-CM | POA: Diagnosis not present

## 2017-02-06 ENCOUNTER — Ambulatory Visit: Payer: Medicare PPO

## 2017-02-07 ENCOUNTER — Ambulatory Visit (INDEPENDENT_AMBULATORY_CARE_PROVIDER_SITE_OTHER): Payer: Medicare PPO

## 2017-02-07 DIAGNOSIS — N319 Neuromuscular dysfunction of bladder, unspecified: Secondary | ICD-10-CM | POA: Diagnosis not present

## 2017-02-07 DIAGNOSIS — J4550 Severe persistent asthma, uncomplicated: Secondary | ICD-10-CM

## 2017-02-07 DIAGNOSIS — R339 Retention of urine, unspecified: Secondary | ICD-10-CM | POA: Diagnosis not present

## 2017-02-08 DIAGNOSIS — M0579 Rheumatoid arthritis with rheumatoid factor of multiple sites without organ or systems involvement: Secondary | ICD-10-CM | POA: Diagnosis not present

## 2017-02-13 ENCOUNTER — Encounter: Payer: Self-pay | Admitting: Pulmonary Disease

## 2017-02-13 ENCOUNTER — Ambulatory Visit (INDEPENDENT_AMBULATORY_CARE_PROVIDER_SITE_OTHER): Payer: Medicare PPO | Admitting: Pulmonary Disease

## 2017-02-13 VITALS — BP 116/70 | HR 86 | Ht 71.0 in | Wt 221.2 lb

## 2017-02-13 DIAGNOSIS — J181 Lobar pneumonia, unspecified organism: Secondary | ICD-10-CM | POA: Diagnosis not present

## 2017-02-13 DIAGNOSIS — J4550 Severe persistent asthma, uncomplicated: Secondary | ICD-10-CM | POA: Diagnosis not present

## 2017-02-13 NOTE — Progress Notes (Signed)
Current Outpatient Prescriptions on File Prior to Visit  Medication Sig  . ALPRAZolam (XANAX) 0.5 MG tablet TAKE 1 TABLET BY MOUTH THREE TIMES DAILY AS NEEDED FOR ANXIETY  . aspirin 81 MG tablet Take 81 mg by mouth daily.  . Cholecalciferol (VITAMIN D3) 5000 UNITS CAPS Take 1 capsule by mouth daily.  . Coenzyme Q10 (CO Q 10) 100 MG CAPS Take 1 capsule by mouth daily.  . Cranberry 425 MG CAPS Take 2 capsules by mouth daily.  . fluticasone (FLONASE) 50 MCG/ACT nasal spray Place 2 sprays into both nostrils 2 (two) times daily.  Marland Kitchen FOLIC ACID PO Take 2,993 mcg by mouth daily.   . furosemide (LASIX) 20 MG tablet TAKE 1 TABLET BY MOUTH EVERY DAY  . gabapentin (NEURONTIN) 300 MG capsule TAKE 1 CAPSULE BY MOUTH THREE TIMES DAILY  . hydrocortisone 2.5 % cream Apply topically 3 (three) times daily as needed.  . hydroxychloroquine (PLAQUENIL) 200 MG tablet Take 1 tablet by mouth 2 (two) times daily.  . methotrexate (RHEUMATREX) 2.5 MG tablet Take 10 tablets by mouth once a week.  . Milk Thistle 250 MG CAPS Take 1 capsule by mouth daily.  . Multiple Vitamin (MULTIVITAMIN) tablet Take 1 tablet by mouth daily.    Marland Kitchen omeprazole (PRILOSEC) 20 MG capsule TAKE 1 CAPSULE BY MOUTH EVERY DAY (Patient taking differently: TAKE 1 CAPSULE BY MOUTH AS NEEDED)  . polyethylene glycol (GLYCOLAX) packet Take 34 g by mouth daily.   . pravastatin (PRAVACHOL) 20 MG tablet TAKE 1 TABLET BY MOUTH EVERY DAY  . Probiotic Product (ALIGN) 4 MG CAPS Take 1 capsule (4 mg total) by mouth daily.  . Selenium 200 MCG CAPS Take 1 capsule by mouth daily.  . solifenacin (VESICARE) 5 MG tablet Take 5 mg by mouth at bedtime. For bladder spasms  . traMADol (ULTRAM) 50 MG tablet TAKE 1 TABLET BY MOUTH FOUR TIMES DAILY AS NEEDED  . Turmeric 500 MG CAPS Take 500 mg by mouth daily.  . vitamin B-12 (CYANOCOBALAMIN) 500 MCG tablet Take 500 mcg by mouth daily.  Marland Kitchen albuterol (PROVENTIL HFA;VENTOLIN HFA) 108 (90 Base) MCG/ACT inhaler Inhale 2 puffs into  the lungs every 6 (six) hours as needed for wheezing or shortness of breath. (Patient not taking: Reported on 02/13/2017)   Current Facility-Administered Medications on File Prior to Visit  Medication  . omalizumab Arvid Right) injection 375 mg  . omalizumab Arvid Right) injection 375 mg  . omalizumab Arvid Right) injection 375 mg  . omalizumab Arvid Right) injection 375 mg  . omalizumab Arvid Right) injection 375 mg     Chief Complaint  Patient presents with  . Follow-up    Pt states that his breathing has been excellent since last OV. No new complaints. Pt due for f/u cxr today to see if PNA is resolved.      Pulmonary tests PFT 04/07/10 >> FEV1 2.61 (86%), FEV1% 73, TLC 7.26 (85%), TLC 112%  Past medical history Rheumatoid arthritis, CAD, Neurogenic bladder, GERD, Prostate cancer, IBS, Anxiety, ED, HTN, Depression, SIADH, OSA >> resolved with weight loss  Past surgical history, Family history, Social history, Allergies reviewed  Vital Signs BP 116/70 (BP Location: Left Arm, Cuff Size: Normal)   Pulse 86   Ht 5\' 11"  (1.803 m)   Wt 221 lb 3.2 oz (100.3 kg)   SpO2 91%   BMI 30.85 kg/m   History of Present Illness [name] is a 83 y.o. male with Allergic asthma.  He has been well maintained on xolair.  He has been on this since 2010.  After starting xolair his frequency of prednisone therapy has gone to nothing.  He is not needing any other maintenance asthma therapy.  He uses albuterol once every few months.  He remains on MTX for RA through Dr. Luane School.  He was seen by Rexene Edison several times for pneumonia.  He was off xolair during that time, and his asthma got worse.  He had to take prednisone and symbicort.  Since then he has restarted xolair, and his breathing is much better.  He is not having cough, wheeze, sputum, or chest pain.  He doesn't need to use albuterol much.  Physical Exam  General - pleasant ENT - no sinus tenderness, no oral exudate, no LAN Cardiac - regular,  no murmur Chest - no wheeze, rales Back - no tenderness Abd - soft, non tender Ext - no edema Neuro - normal strength Skin - no rashes Psych - normal mood   Assessment/Plan  Allergic asthma. - continue xolair and prn albuterol  Recent pneumonia. - clinically improved - f/u CXR   Patient Instructions  Chest xray today  Follow up in 1 year    Chesley Mires, MD [place] [facility]/Critical [facility]/Sleep Pager:  [phone] 02/13/2017, 5:31 PM

## 2017-02-13 NOTE — Patient Instructions (Signed)
Chest xray today  Follow up in 1 year 

## 2017-02-14 ENCOUNTER — Telehealth: Payer: Self-pay | Admitting: Pulmonary Disease

## 2017-02-14 DIAGNOSIS — Z79899 Other long term (current) drug therapy: Secondary | ICD-10-CM | POA: Diagnosis not present

## 2017-02-14 DIAGNOSIS — G8929 Other chronic pain: Secondary | ICD-10-CM | POA: Diagnosis not present

## 2017-02-14 DIAGNOSIS — M25511 Pain in right shoulder: Secondary | ICD-10-CM | POA: Diagnosis not present

## 2017-02-14 DIAGNOSIS — M0579 Rheumatoid arthritis with rheumatoid factor of multiple sites without organ or systems involvement: Secondary | ICD-10-CM | POA: Diagnosis not present

## 2017-02-14 NOTE — Telephone Encounter (Signed)
lmtcb X1 for pt  

## 2017-02-16 NOTE — Telephone Encounter (Signed)
Okay to do CXR on 02/22/17.

## 2017-02-16 NOTE — Telephone Encounter (Signed)
Spoke with pt, who states after his OV with VS on 02-13-17, he forgot to stop by and have his CXR. Pt is scheduled for his xolair injection on 02-22-17, and would like to know if he could have his CXR then, due to living in [place].  VS please advise. Thanks.

## 2017-02-16 NOTE — Telephone Encounter (Signed)
Spoke with pt,aware of recs.  Nothing further needed.  

## 2017-02-22 ENCOUNTER — Ambulatory Visit (INDEPENDENT_AMBULATORY_CARE_PROVIDER_SITE_OTHER): Payer: Medicare PPO

## 2017-02-22 DIAGNOSIS — J4550 Severe persistent asthma, uncomplicated: Secondary | ICD-10-CM | POA: Diagnosis not present

## 2017-02-27 MED ORDER — OMALIZUMAB 150 MG ~~LOC~~ SOLR
375.0000 mg | Freq: Once | SUBCUTANEOUS | Status: AC
Start: 2017-02-07 — End: 2017-02-07
  Administered 2017-02-07: 375 mg via SUBCUTANEOUS

## 2017-02-27 MED ORDER — OMALIZUMAB 150 MG ~~LOC~~ SOLR
375.0000 mg | Freq: Once | SUBCUTANEOUS | Status: AC
  Administered 2017-02-22: 375 mg via SUBCUTANEOUS

## 2017-02-27 NOTE — Progress Notes (Signed)
[name] came in on 3.28.18 to receive a Xolair injection. The patient is given 375mg  every 14 days. Due to each vial equalling 150mg , 15 units of medication was wasted.

## 2017-02-27 NOTE — Progress Notes (Signed)
[name] came in on 3.13.18 to receive a Xolair injection. The patient is given 375mg  every 14 days. Due to each vial equalling 150mg , 15mg  of medication was wasted.

## 2017-03-01 ENCOUNTER — Telehealth: Payer: Self-pay | Admitting: Pulmonary Disease

## 2017-03-01 DIAGNOSIS — N319 Neuromuscular dysfunction of bladder, unspecified: Secondary | ICD-10-CM | POA: Diagnosis not present

## 2017-03-01 NOTE — Telephone Encounter (Signed)
#   vials:6 Ordered date:03/01/17 Shipping Date:03/01/17

## 2017-03-02 NOTE — Telephone Encounter (Signed)
#   Vials:6 Arrival Date:03/02/17 Lot #:[id number] Exp Date:9/21

## 2017-03-03 ENCOUNTER — Other Ambulatory Visit: Payer: Self-pay | Admitting: Internal Medicine

## 2017-03-03 NOTE — Telephone Encounter (Signed)
Last filled 01-27-17 #90 Last OV 01-16-17 Next OV 04-27-17

## 2017-03-03 NOTE — Telephone Encounter (Signed)
Approved: #[id number] x 0 

## 2017-03-03 NOTE — Telephone Encounter (Signed)
Left refill on voice mail at pharmacy  

## 2017-03-06 DIAGNOSIS — R600 Localized edema: Secondary | ICD-10-CM | POA: Diagnosis not present

## 2017-03-06 DIAGNOSIS — E871 Hypo-osmolality and hyponatremia: Secondary | ICD-10-CM | POA: Diagnosis not present

## 2017-03-06 DIAGNOSIS — R601 Generalized edema: Secondary | ICD-10-CM | POA: Diagnosis not present

## 2017-03-09 ENCOUNTER — Ambulatory Visit (INDEPENDENT_AMBULATORY_CARE_PROVIDER_SITE_OTHER): Payer: Medicare PPO

## 2017-03-09 ENCOUNTER — Ambulatory Visit (INDEPENDENT_AMBULATORY_CARE_PROVIDER_SITE_OTHER)
Admission: RE | Admit: 2017-03-09 | Discharge: 2017-03-09 | Disposition: A | Discharge status: Home / Self Care | Payer: Medicare PPO | Source: Ambulatory Visit | Attending: Pulmonary Disease | Admitting: Pulmonary Disease

## 2017-03-09 DIAGNOSIS — J181 Lobar pneumonia, unspecified organism: Secondary | ICD-10-CM

## 2017-03-09 DIAGNOSIS — J984 Other disorders of lung: Secondary | ICD-10-CM | POA: Diagnosis not present

## 2017-03-09 DIAGNOSIS — J4550 Severe persistent asthma, uncomplicated: Secondary | ICD-10-CM | POA: Diagnosis not present

## 2017-03-10 ENCOUNTER — Telehealth: Payer: Self-pay | Admitting: Pulmonary Disease

## 2017-03-10 DIAGNOSIS — R918 Other nonspecific abnormal finding of lung field: Secondary | ICD-10-CM

## 2017-03-10 MED ORDER — OMALIZUMAB 150 MG ~~LOC~~ SOLR
375.0000 mg | Freq: Once | SUBCUTANEOUS | Status: AC
  Administered 2017-03-09: 375 mg via SUBCUTANEOUS

## 2017-03-10 NOTE — Progress Notes (Signed)
[name] came in on 4.12.18 to receive a Xolair injection. The patient is given 375mg  every 14 days. Due to each vial equalling 150mg , 15mg  of medication was wasted.

## 2017-03-10 NOTE — Telephone Encounter (Signed)
Dg Chest 2 View  Result Date: 03/09/2017 CLINICAL DATA:  Pneumonia. No current complaints. History of asthma, coronary artery disease, never a smoker. EXAM: CHEST  2 VIEW COMPARISON:  PA and lateral chest x-ray of December 26, 2016 and PA and lateral chest x-ray and chest CT scan of December 11, 2016. FINDINGS: The right lung is well-expanded and clear. On the left there is persistent linear increased density in the lower lobe posteriorly. This is slightly less conspicuous than on the previous study. The heart and pulmonary vascularity are normal. There is calcification in the wall of the aortic arch. The bony thorax exhibits no acute abnormality. There is prominent thoracic kyphosis without compression fracture. IMPRESSION: Persistent parenchymal density in the left lower lobe. Repeat chest CT scanning is recommended for direct comparison to judge if there has been significant improvement and to exclude an occult mass. Thoracic aortic atherosclerosis. Electronically Signed   By: David  Martinique M.D.   On: 03/09/2017 15:48     Discussed results with pt.  Will arrange for CT chest w/o contrast to further assess.

## 2017-03-13 DIAGNOSIS — R339 Retention of urine, unspecified: Secondary | ICD-10-CM | POA: Diagnosis not present

## 2017-03-15 ENCOUNTER — Ambulatory Visit
Admission: RE | Admit: 2017-03-15 | Discharge: 2017-03-15 | Disposition: A | Discharge status: Home / Self Care | Payer: Medicare PPO | Source: Ambulatory Visit | Attending: Pulmonary Disease | Admitting: Pulmonary Disease

## 2017-03-15 DIAGNOSIS — R918 Other nonspecific abnormal finding of lung field: Secondary | ICD-10-CM | POA: Diagnosis not present

## 2017-03-16 ENCOUNTER — Other Ambulatory Visit: Payer: Self-pay | Admitting: Internal Medicine

## 2017-03-16 NOTE — Telephone Encounter (Signed)
Last filled 01-27-17 #120 Last OV 01-16-17 Next OV 04-27-17

## 2017-03-17 ENCOUNTER — Telehealth: Payer: Self-pay | Admitting: Pulmonary Disease

## 2017-03-17 DIAGNOSIS — R9389 Abnormal findings on diagnostic imaging of other specified body structures: Secondary | ICD-10-CM

## 2017-03-17 DIAGNOSIS — M069 Rheumatoid arthritis, unspecified: Secondary | ICD-10-CM

## 2017-03-17 DIAGNOSIS — R911 Solitary pulmonary nodule: Secondary | ICD-10-CM

## 2017-03-17 NOTE — Telephone Encounter (Signed)
Ct chest 03/15/17 > atherosclerosis, 3.4 cm Lt lung base density rounded ATX, granuloma in lingula, 3 mm RLL nodule, 4 mm RLL nodule  Results d/w pt.  Will need to schedule repeat CT chest w/o contrast in 6 months to monitor.

## 2017-03-17 NOTE — Telephone Encounter (Signed)
Left refill on voice mail at pharmacy  

## 2017-03-17 NOTE — Telephone Encounter (Signed)
Approved: #[id number] x 0 

## 2017-03-24 ENCOUNTER — Ambulatory Visit (INDEPENDENT_AMBULATORY_CARE_PROVIDER_SITE_OTHER): Payer: Medicare PPO

## 2017-03-24 ENCOUNTER — Telehealth: Payer: Self-pay | Admitting: Pulmonary Disease

## 2017-03-24 DIAGNOSIS — J4540 Moderate persistent asthma, uncomplicated: Secondary | ICD-10-CM | POA: Diagnosis not present

## 2017-03-24 NOTE — Telephone Encounter (Signed)
Spoke with pt, states he spoke on the phone with VS regarding his recent CT chest, but would like an office visit for further questions regarding it.  Pt is here to for xolair injections today, will schedule ov with VS up front before leaving office today.  Will close encounter.

## 2017-03-27 MED ORDER — OMALIZUMAB 150 MG ~~LOC~~ SOLR
375.0000 mg | SUBCUTANEOUS | Status: DC
  Administered 2017-03-24: 375 mg via SUBCUTANEOUS

## 2017-03-27 NOTE — Progress Notes (Signed)
patient came in on 03/24/2017 to receive a Xolair injection. The patient is given 375mg  every 14 days. Due to each vial equalling 150mg , 75mg  of medication was wasted.  Xolair injection documentation and charges entered by Maury Dus, RMA, based on injection sheet filled out by Alroy Bailiff per office protocol.

## 2017-03-28 ENCOUNTER — Telehealth: Payer: Self-pay | Admitting: Pulmonary Disease

## 2017-03-28 ENCOUNTER — Ambulatory Visit: Payer: Medicare PPO | Admitting: Pulmonary Disease

## 2017-03-28 NOTE — Telephone Encounter (Signed)
#   vials:6 Ordered date:03/28/17 Shipping Date:03/28/17

## 2017-03-29 DIAGNOSIS — N319 Neuromuscular dysfunction of bladder, unspecified: Secondary | ICD-10-CM | POA: Diagnosis not present

## 2017-03-29 NOTE — Telephone Encounter (Signed)
#   Vials:6 Arrival Date:03/29/17 Lot #[id number] Exp Date:9/21

## 2017-03-30 ENCOUNTER — Encounter: Payer: Self-pay | Admitting: Pulmonary Disease

## 2017-03-30 ENCOUNTER — Ambulatory Visit (INDEPENDENT_AMBULATORY_CARE_PROVIDER_SITE_OTHER): Payer: Medicare PPO | Admitting: Pulmonary Disease

## 2017-03-30 VITALS — BP 120/68 | HR 72 | Ht 71.0 in | Wt 214.2 lb

## 2017-03-30 DIAGNOSIS — R938 Abnormal findings on diagnostic imaging of other specified body structures: Secondary | ICD-10-CM | POA: Diagnosis not present

## 2017-03-30 DIAGNOSIS — J4550 Severe persistent asthma, uncomplicated: Secondary | ICD-10-CM

## 2017-03-30 DIAGNOSIS — R911 Solitary pulmonary nodule: Secondary | ICD-10-CM

## 2017-03-30 DIAGNOSIS — R9389 Abnormal findings on diagnostic imaging of other specified body structures: Secondary | ICD-10-CM

## 2017-03-30 NOTE — Patient Instructions (Signed)
Follow up in 1 year.

## 2017-03-30 NOTE — Progress Notes (Signed)
Current Outpatient Prescriptions on File Prior to Visit  Medication Sig  . albuterol (PROVENTIL HFA;VENTOLIN HFA) 108 (90 Base) MCG/ACT inhaler Inhale 2 puffs into the lungs every 6 (six) hours as needed for wheezing or shortness of breath.  . ALPRAZolam (XANAX) 0.5 MG tablet TAKE 1 TABLET BY MOUTH THREE TIMES DAILY AS NEEDED FOR ANXIETY  . aspirin 81 MG tablet Take 81 mg by mouth daily.  . Cholecalciferol (VITAMIN D3) 5000 UNITS CAPS Take 1 capsule by mouth daily.  . Coenzyme Q10 (CO Q 10) 100 MG CAPS Take 1 capsule by mouth daily.  . Cranberry 425 MG CAPS Take 2 capsules by mouth daily.  . fluticasone (FLONASE) 50 MCG/ACT nasal spray Place 2 sprays into both nostrils 2 (two) times daily.  Marland Kitchen FOLIC ACID PO Take 6,606 mcg by mouth daily.   . furosemide (LASIX) 20 MG tablet TAKE 1 TABLET BY MOUTH EVERY DAY  . gabapentin (NEURONTIN) 300 MG capsule TAKE 1 CAPSULE BY MOUTH THREE TIMES DAILY  . hydrocortisone 2.5 % cream Apply topically 3 (three) times daily as needed.  . hydroxychloroquine (PLAQUENIL) 200 MG tablet Take 1 tablet by mouth 2 (two) times daily.  . methotrexate (RHEUMATREX) 2.5 MG tablet Take 10 tablets by mouth once a week.  . Milk Thistle 250 MG CAPS Take 1 capsule by mouth daily.  . Multiple Vitamin (MULTIVITAMIN) tablet Take 1 tablet by mouth daily.    Marland Kitchen omeprazole (PRILOSEC) 20 MG capsule TAKE 1 CAPSULE BY MOUTH EVERY DAY (Patient taking differently: TAKE 1 CAPSULE BY MOUTH AS NEEDED)  . polyethylene glycol (GLYCOLAX) packet Take 34 g by mouth daily.   . pravastatin (PRAVACHOL) 20 MG tablet TAKE 1 TABLET BY MOUTH EVERY DAY  . Probiotic Product (ALIGN) 4 MG CAPS Take 1 capsule (4 mg total) by mouth daily.  . Selenium 200 MCG CAPS Take 1 capsule by mouth daily.  . solifenacin (VESICARE) 5 MG tablet Take 5 mg by mouth at bedtime. For bladder spasms  . traMADol (ULTRAM) 50 MG tablet TAKE 1 TABLET BY MOUTH FOUR TIMES DAILY AS NEEDED  . Turmeric 500 MG CAPS Take 500 mg by mouth daily.   . vitamin B-12 (CYANOCOBALAMIN) 500 MCG tablet Take 500 mcg by mouth daily.   Current Facility-Administered Medications on File Prior to Visit  Medication  . omalizumab Arvid Right) injection 375 mg     Chief Complaint  Patient presents with  . Follow-up    Pt here after CT chest, pt would like to discuss this in more detail. Pt states overall his breathing is unchanged since last OV. Pt denies CP/tightness and f/c/s.      Pulmonary tests PFT 04/07/10 >> FEV1 2.61 (86%), FEV1% 73, TLC 7.26 (85%), TLC 112% Ct chest 03/15/17 > atherosclerosis, 3.4 cm Lt lung base density rounded ATX, granuloma in lingula, 3 mm RLL nodule, 4 mm RLL nodule  Past medical history Rheumatoid arthritis, CAD, Neurogenic bladder, GERD, Prostate cancer, IBS, Anxiety, ED, HTN, Depression, SIADH, OSA >> resolved with weight loss  Past surgical history, Family history, Social history, Allergies reviewed  Vital Signs BP 120/68 (BP Location: Left Arm, Cuff Size: Normal)   Pulse 72   Ht 5\' 11"  (1.803 m)   Wt 214 lb 3.2 oz (97.2 kg)   SpO2 93%   BMI 29.87 kg/m   History of Present Illness [name] is a 83 y.o. male with Allergic asthma.  He is here to review his recent CT chest.  Breathing okay.  Denies  chest pain.  Had episode recently in which he woke up in a dream and had trouble breathing.  This was a brief episode, and only happened once.    Physical Exam  General - pleasant Eyes - pupils reactive ENT - no sinus tenderness, no oral exudate, no LAN Cardiac - regular, no murmur Chest - no wheeze, rales Abd - soft, non tender Ext - no edema Skin - no rashes Neuro - normal strength Psych - normal mood   Assessment/Plan  Allergic asthma. - continue xolair >> will check what the issue is with insurance coverage - continue prn albuterol  Rounded atelectasis with lung nodules. - will repeat CT chest w/o contrast in October 2018   Patient Instructions  Follow up in 1 year   Chesley Mires,  MD [place] [facility]/Critical Care/Sleep Pager:  [phone] 03/30/2017, 1:23 PM

## 2017-04-03 ENCOUNTER — Encounter: Payer: Self-pay | Admitting: Emergency Medicine

## 2017-04-03 ENCOUNTER — Inpatient Hospital Stay
Admission: EM | Admit: 2017-04-03 | Discharge: 2017-04-05 | DRG: 683 | Disposition: A | Discharge status: Skilled Nursing Facility | Payer: Medicare PPO | Attending: Internal Medicine | Admitting: Internal Medicine

## 2017-04-03 DIAGNOSIS — F329 Major depressive disorder, single episode, unspecified: Secondary | ICD-10-CM | POA: Diagnosis present

## 2017-04-03 DIAGNOSIS — R4182 Altered mental status, unspecified: Secondary | ICD-10-CM | POA: Diagnosis not present

## 2017-04-03 DIAGNOSIS — N289 Disorder of kidney and ureter, unspecified: Secondary | ICD-10-CM | POA: Diagnosis not present

## 2017-04-03 DIAGNOSIS — E860 Dehydration: Secondary | ICD-10-CM | POA: Diagnosis present

## 2017-04-03 DIAGNOSIS — E222 Syndrome of inappropriate secretion of antidiuretic hormone: Secondary | ICD-10-CM | POA: Diagnosis not present

## 2017-04-03 DIAGNOSIS — R531 Weakness: Secondary | ICD-10-CM

## 2017-04-03 DIAGNOSIS — Z86010 Personal history of colonic polyps: Secondary | ICD-10-CM | POA: Diagnosis not present

## 2017-04-03 DIAGNOSIS — Y846 Urinary catheterization as the cause of abnormal reaction of the patient, or of later complication, without mention of misadventure at the time of the procedure: Secondary | ICD-10-CM | POA: Diagnosis present

## 2017-04-03 DIAGNOSIS — N179 Acute kidney failure, unspecified: Principal | ICD-10-CM | POA: Diagnosis present

## 2017-04-03 DIAGNOSIS — J4540 Moderate persistent asthma, uncomplicated: Secondary | ICD-10-CM

## 2017-04-03 DIAGNOSIS — Z96651 Presence of right artificial knee joint: Secondary | ICD-10-CM | POA: Diagnosis present

## 2017-04-03 DIAGNOSIS — G4733 Obstructive sleep apnea (adult) (pediatric): Secondary | ICD-10-CM | POA: Diagnosis present

## 2017-04-03 DIAGNOSIS — T83511A Infection and inflammatory reaction due to indwelling urethral catheter, initial encounter: Secondary | ICD-10-CM | POA: Diagnosis present

## 2017-04-03 DIAGNOSIS — N529 Male erectile dysfunction, unspecified: Secondary | ICD-10-CM | POA: Diagnosis present

## 2017-04-03 DIAGNOSIS — E785 Hyperlipidemia, unspecified: Secondary | ICD-10-CM | POA: Diagnosis present

## 2017-04-03 DIAGNOSIS — R262 Difficulty in walking, not elsewhere classified: Secondary | ICD-10-CM | POA: Diagnosis not present

## 2017-04-03 DIAGNOSIS — Z466 Encounter for fitting and adjustment of urinary device: Secondary | ICD-10-CM | POA: Diagnosis not present

## 2017-04-03 DIAGNOSIS — K219 Gastro-esophageal reflux disease without esophagitis: Secondary | ICD-10-CM | POA: Diagnosis present

## 2017-04-03 DIAGNOSIS — E871 Hypo-osmolality and hyponatremia: Secondary | ICD-10-CM | POA: Diagnosis not present

## 2017-04-03 DIAGNOSIS — M069 Rheumatoid arthritis, unspecified: Secondary | ICD-10-CM | POA: Diagnosis not present

## 2017-04-03 DIAGNOSIS — Z79899 Other long term (current) drug therapy: Secondary | ICD-10-CM | POA: Diagnosis not present

## 2017-04-03 DIAGNOSIS — I10 Essential (primary) hypertension: Secondary | ICD-10-CM | POA: Diagnosis present

## 2017-04-03 DIAGNOSIS — I2510 Atherosclerotic heart disease of native coronary artery without angina pectoris: Secondary | ICD-10-CM | POA: Diagnosis present

## 2017-04-03 DIAGNOSIS — Z7982 Long term (current) use of aspirin: Secondary | ICD-10-CM

## 2017-04-03 DIAGNOSIS — N319 Neuromuscular dysfunction of bladder, unspecified: Secondary | ICD-10-CM | POA: Diagnosis not present

## 2017-04-03 DIAGNOSIS — R778 Other specified abnormalities of plasma proteins: Secondary | ICD-10-CM

## 2017-04-03 DIAGNOSIS — R7989 Other specified abnormal findings of blood chemistry: Secondary | ICD-10-CM

## 2017-04-03 DIAGNOSIS — N390 Urinary tract infection, site not specified: Secondary | ICD-10-CM | POA: Diagnosis not present

## 2017-04-03 DIAGNOSIS — Z881 Allergy status to other antibiotic agents status: Secondary | ICD-10-CM | POA: Diagnosis not present

## 2017-04-03 DIAGNOSIS — Z95828 Presence of other vascular implants and grafts: Secondary | ICD-10-CM

## 2017-04-03 DIAGNOSIS — M6281 Muscle weakness (generalized): Secondary | ICD-10-CM | POA: Diagnosis not present

## 2017-04-03 DIAGNOSIS — Z8546 Personal history of malignant neoplasm of prostate: Secondary | ICD-10-CM

## 2017-04-03 DIAGNOSIS — R410 Disorientation, unspecified: Secondary | ICD-10-CM | POA: Diagnosis not present

## 2017-04-03 DIAGNOSIS — Z888 Allergy status to other drugs, medicaments and biological substances status: Secondary | ICD-10-CM

## 2017-04-03 DIAGNOSIS — Z743 Need for continuous supervision: Secondary | ICD-10-CM | POA: Diagnosis not present

## 2017-04-03 LAB — BASIC METABOLIC PANEL
ANION GAP: 7 (ref 5–15)
Anion gap: 8 (ref 5–15)
BUN: 55 mg/dL — ABNORMAL HIGH (ref 6–20)
BUN: 48 mg/dL — ABNORMAL HIGH (ref 6–20)
CALCIUM: 8.8 mg/dL — AB (ref 8.9–10.3)
CALCIUM: 8.4 mg/dL — AB (ref 8.9–10.3)
CO2: 27 mmol/L (ref 22–32)
CO2: 26 mmol/L (ref 22–32)
CREATININE: 1.99 mg/dL — AB (ref 0.61–1.24)
CREATININE: 1.56 mg/dL — AB (ref 0.61–1.24)
Chloride: 93 mmol/L — ABNORMAL LOW (ref 101–111)
Chloride: 96 mmol/L — ABNORMAL LOW (ref 101–111)
GFR calc Af Amer: 35 mL/min — ABNORMAL LOW (ref >60)
GFR, EST AFRICAN AMERICAN: 47 mL/min — AB (ref >60)
GFR, EST NON AFRICAN AMERICAN: 30 mL/min — AB (ref >60)
GFR, EST NON AFRICAN AMERICAN: 40 mL/min — AB (ref >60)
GLUCOSE: 108 mg/dL — AB (ref 65–99)
Glucose, Bld: 98 mg/dL (ref 65–99)
Potassium: 4.9 mmol/L (ref 3.5–5.1)
Potassium: 4.1 mmol/L (ref 3.5–5.1)
SODIUM: 129 mmol/L — AB (ref 135–145)
Sodium: 128 mmol/L — ABNORMAL LOW (ref 135–145)

## 2017-04-03 LAB — TROPONIN I: TROPONIN I: 0.06 ng/mL — AB (ref <0.03)

## 2017-04-03 LAB — URINALYSIS, COMPLETE (UACMP) WITH MICROSCOPIC
BILIRUBIN URINE: NEGATIVE
Glucose, UA: NEGATIVE mg/dL
Ketones, ur: NEGATIVE mg/dL
Nitrite: POSITIVE — AB
Protein, ur: 100 mg/dL — AB
SPECIFIC GRAVITY, URINE: 1.014 (ref 1.005–1.030)
pH: 5 (ref 5.0–8.0)

## 2017-04-03 LAB — CBC
HCT: 36.9 % — ABNORMAL LOW (ref 40.0–52.0)
Hemoglobin: 13 g/dL (ref 13.0–18.0)
MCH: 33.6 pg (ref 26.0–34.0)
MCHC: 35.3 g/dL (ref 32.0–36.0)
MCV: 95.3 fL (ref 80.0–100.0)
PLATELETS: 102 10*3/uL — AB (ref 150–440)
RBC: 3.88 MIL/uL — ABNORMAL LOW (ref 4.40–5.90)
RDW: 14.2 % (ref 11.5–14.5)
WBC: 8.6 10*3/uL (ref 3.8–10.6)

## 2017-04-03 LAB — TSH: TSH: 3.598 u[IU]/mL (ref 0.350–4.500)

## 2017-04-03 LAB — LACTIC ACID, PLASMA: Lactic Acid, Venous: 1.1 mmol/L (ref 0.5–1.9)

## 2017-04-03 MED ORDER — ONDANSETRON HCL 4 MG PO TABS: 4.0000 mg | ORAL_TABLET | Freq: Four times a day (QID) | ORAL | Status: DC | PRN

## 2017-04-03 MED ORDER — CEFTRIAXONE SODIUM IN DEXTROSE 20 MG/ML IV SOLN
1.0000 g | INTRAVENOUS | Status: DC
  Administered 2017-04-03: 1 g via INTRAVENOUS
  Filled 2017-04-03: qty 50

## 2017-04-03 MED ORDER — CRANBERRY 425 MG PO CAPS: 2.0000 | ORAL_CAPSULE | Freq: Every day | ORAL | Status: DC

## 2017-04-03 MED ORDER — HYDROXYCHLOROQUINE SULFATE 200 MG PO TABS
200.0000 mg | ORAL_TABLET | Freq: Two times a day (BID) | ORAL | Status: DC
  Administered 2017-04-03 – 2017-04-05 (×5): 200 mg via ORAL
  Filled 2017-04-03 (×5): qty 1

## 2017-04-03 MED ORDER — VITAMIN D 1000 UNITS PO TABS
5000.0000 [IU] | ORAL_TABLET | Freq: Every day | ORAL | Status: DC
  Administered 2017-04-03 – 2017-04-04 (×2): 5000 [IU] via ORAL
  Filled 2017-04-03 (×3): qty 5

## 2017-04-03 MED ORDER — HYDROCORTISONE 1 % EX CREA
TOPICAL_CREAM | Freq: Three times a day (TID) | CUTANEOUS | Status: DC | PRN
  Administered 2017-04-03 – 2017-04-05 (×2): via TOPICAL
  Filled 2017-04-03: qty 28

## 2017-04-03 MED ORDER — ALPRAZOLAM 0.5 MG PO TABS
0.5000 mg | ORAL_TABLET | Freq: Three times a day (TID) | ORAL | Status: DC | PRN
  Administered 2017-04-04 – 2017-04-05 (×2): 0.5 mg via ORAL
  Filled 2017-04-03 (×2): qty 1

## 2017-04-03 MED ORDER — SODIUM CHLORIDE 0.9 % IV SOLN
INTRAVENOUS | Status: DC
  Administered 2017-04-03 – 2017-04-04 (×4): via INTRAVENOUS

## 2017-04-03 MED ORDER — ADULT MULTIVITAMIN W/MINERALS CH
1.0000 | ORAL_TABLET | Freq: Every day | ORAL | Status: DC
  Administered 2017-04-03 – 2017-04-04 (×2): 1 via ORAL
  Filled 2017-04-03 (×3): qty 1

## 2017-04-03 MED ORDER — DOCUSATE SODIUM 100 MG PO CAPS
100.0000 mg | ORAL_CAPSULE | Freq: Two times a day (BID) | ORAL | Status: DC
  Administered 2017-04-03 – 2017-04-05 (×3): 100 mg via ORAL
  Filled 2017-04-03 (×4): qty 1

## 2017-04-03 MED ORDER — FUROSEMIDE 20 MG PO TABS: 20.0000 mg | ORAL_TABLET | Freq: Every day | ORAL | Status: DC

## 2017-04-03 MED ORDER — ASPIRIN 81 MG PO CHEW
81.0000 mg | CHEWABLE_TABLET | Freq: Every day | ORAL | Status: DC
  Administered 2017-04-03 – 2017-04-04 (×2): 81 mg via ORAL
  Filled 2017-04-03 (×3): qty 1

## 2017-04-03 MED ORDER — ENOXAPARIN SODIUM 40 MG/0.4ML ~~LOC~~ SOLN
40.0000 mg | SUBCUTANEOUS | Status: DC
  Administered 2017-04-03 – 2017-04-04 (×2): 40 mg via SUBCUTANEOUS
  Filled 2017-04-03 (×2): qty 0.4

## 2017-04-03 MED ORDER — ALBUTEROL SULFATE (2.5 MG/3ML) 0.083% IN NEBU
2.5000 mg | INHALATION_SOLUTION | RESPIRATORY_TRACT | Status: DC | PRN
  Administered 2017-04-05 (×2): 2.5 mg via RESPIRATORY_TRACT
  Filled 2017-04-03 (×2): qty 3

## 2017-04-03 MED ORDER — HYDROCORTISONE 2.5 % EX CREA: TOPICAL_CREAM | Freq: Three times a day (TID) | CUTANEOUS | Status: DC | PRN

## 2017-04-03 MED ORDER — RISAQUAD PO CAPS
1.0000 | ORAL_CAPSULE | Freq: Every day | ORAL | Status: DC
  Administered 2017-04-03 – 2017-04-05 (×3): 1 via ORAL
  Filled 2017-04-03 (×4): qty 1

## 2017-04-03 MED ORDER — POLYETHYLENE GLYCOL 3350 17 G PO PACK
34.0000 g | PACK | Freq: Every day | ORAL | Status: DC
  Administered 2017-04-03 – 2017-04-05 (×2): 34 g via ORAL
  Filled 2017-04-03 (×2): qty 2

## 2017-04-03 MED ORDER — METHOTREXATE 2.5 MG PO TABS: 25.0000 mg | ORAL_TABLET | ORAL | Status: DC

## 2017-04-03 MED ORDER — ACETAMINOPHEN 650 MG RE SUPP: 650.0000 mg | Freq: Four times a day (QID) | RECTAL | Status: DC | PRN

## 2017-04-03 MED ORDER — SODIUM CHLORIDE 0.9 % IV BOLUS (SEPSIS)
1000.0000 mL | Freq: Once | INTRAVENOUS | Status: AC
  Administered 2017-04-03: 1000 mL via INTRAVENOUS

## 2017-04-03 MED ORDER — CEFTRIAXONE SODIUM 2 G IJ SOLR
2.0000 g | INTRAMUSCULAR | Status: DC
  Administered 2017-04-03 – 2017-04-05 (×3): 2 g via INTRAVENOUS
  Filled 2017-04-03 (×3): qty 2

## 2017-04-03 MED ORDER — TURMERIC 500 MG PO CAPS: 500.0000 mg | ORAL_CAPSULE | Freq: Every day | ORAL | Status: DC

## 2017-04-03 MED ORDER — ACETAMINOPHEN 325 MG PO TABS
650.0000 mg | ORAL_TABLET | Freq: Four times a day (QID) | ORAL | Status: DC | PRN
  Administered 2017-04-04: 650 mg via ORAL
  Filled 2017-04-03: qty 2

## 2017-04-03 MED ORDER — TRAMADOL HCL 50 MG PO TABS
50.0000 mg | ORAL_TABLET | Freq: Four times a day (QID) | ORAL | Status: DC | PRN
  Administered 2017-04-03: 50 mg via ORAL
  Filled 2017-04-03: qty 1

## 2017-04-03 MED ORDER — GABAPENTIN 300 MG PO CAPS
300.0000 mg | ORAL_CAPSULE | Freq: Three times a day (TID) | ORAL | Status: DC
  Administered 2017-04-03 – 2017-04-05 (×7): 300 mg via ORAL
  Filled 2017-04-03 (×7): qty 1

## 2017-04-03 MED ORDER — DARIFENACIN HYDROBROMIDE ER 7.5 MG PO TB24
7.5000 mg | ORAL_TABLET | Freq: Every day | ORAL | Status: DC
Start: 2017-04-03 — End: 2017-04-03

## 2017-04-03 MED ORDER — PANTOPRAZOLE SODIUM 40 MG PO TBEC
40.0000 mg | DELAYED_RELEASE_TABLET | Freq: Every day | ORAL | Status: DC
  Administered 2017-04-03 – 2017-04-04 (×2): 40 mg via ORAL
  Filled 2017-04-03 (×3): qty 1

## 2017-04-03 MED ORDER — HEPARIN SODIUM (PORCINE) 5000 UNIT/ML IJ SOLN: 5000.0000 [IU] | Freq: Three times a day (TID) | INTRAMUSCULAR | Status: DC

## 2017-04-03 MED ORDER — MILK THISTLE 250 MG PO CAPS
1.0000 | ORAL_CAPSULE | Freq: Every day | ORAL | Status: DC
Start: 2017-04-03 — End: 2017-04-03

## 2017-04-03 MED ORDER — FLUTICASONE PROPIONATE 50 MCG/ACT NA SUSP
2.0000 | Freq: Two times a day (BID) | NASAL | Status: DC
Start: 2017-04-03 — End: 2017-04-05
  Administered 2017-04-03 – 2017-04-05 (×5): 2 via NASAL
  Filled 2017-04-03: qty 16

## 2017-04-03 MED ORDER — ONDANSETRON HCL 4 MG/2ML IJ SOLN: 4.0000 mg | Freq: Four times a day (QID) | INTRAMUSCULAR | Status: DC | PRN

## 2017-04-03 MED ORDER — SELENIUM 200 MCG PO CAPS: 1.0000 | ORAL_CAPSULE | Freq: Every day | ORAL | Status: DC

## 2017-04-03 MED ORDER — PRAVASTATIN SODIUM 20 MG PO TABS
20.0000 mg | ORAL_TABLET | Freq: Every day | ORAL | Status: DC
  Administered 2017-04-03 – 2017-04-05 (×3): 20 mg via ORAL
  Filled 2017-04-03 (×3): qty 1

## 2017-04-03 MED ORDER — FOLIC ACID 1 MG PO TABS
1000.0000 ug | ORAL_TABLET | Freq: Every day | ORAL | Status: DC
  Administered 2017-04-03 – 2017-04-05 (×3): 1 mg via ORAL
  Filled 2017-04-03 (×3): qty 1

## 2017-04-03 MED ORDER — VITAMIN B-12 1000 MCG PO TABS
500.0000 ug | ORAL_TABLET | Freq: Every day | ORAL | Status: DC
  Administered 2017-04-03 – 2017-04-04 (×2): 500 ug via ORAL
  Filled 2017-04-03: qty 2
  Filled 2017-04-03 (×2): qty 1

## 2017-04-03 MED ORDER — CO Q 10 100 MG PO CAPS: 1.0000 | ORAL_CAPSULE | Freq: Every day | ORAL | Status: DC

## 2017-04-03 MED ORDER — DEXTROSE 5 % IV SOLN: 1.0000 g | INTRAVENOUS | Status: DC

## 2017-04-03 NOTE — Progress Notes (Signed)
Pharmacy Antibiotic Note  [name] is a 83 y.o. male admitted on 04/03/2017 with UTI.  Pharmacy has been consulted for ceftriaxone dosing.  Plan: Ceftriaxone 2 grams q 24 hours ordered.  Height: 5\' 11"  (180.3 cm) Weight: 222 lb 12.8 oz (101.1 kg) IBW/kg (Calculated) : 75.3  Temp (24hrs), Avg:99.3 F (37.4 C), Min:98.6 F (37 C), Max:100 F (37.8 C)   Recent Labs Lab 04/03/17 0203 04/03/17 0334  WBC 8.6  --   CREATININE 1.99*  --   LATICACIDVEN  --  1.1    Estimated Creatinine Clearance: 35.8 mL/min (A) (by C-G formula based on SCr of 1.99 mg/dL (H)).    Allergies  Allergen Reactions  . Ciprofloxacin Nausea And Vomiting    Headache  . Citalopram Hydrobromide Other (See Comments)    "unknown"  . Lorazepam     Adverse reaction  . Paroxetine Nausea Only  . Ramipril Other (See Comments)    "unknown"  . Simvastatin     Antimicrobials this admission: ceftriaxone  >>    >>   Dose adjustments this admission:   Microbiology results: 5/7 BCx: pending 5/7 UCx: pending         5/7 UA: LE(+) NO2(+) WBC TNTC Thank you for allowing pharmacy to be a part of this patient's care.  Moxie Kalil S 04/03/2017 6:38 AM

## 2017-04-03 NOTE — ED Notes (Signed)
Patient tolerating IV fluids/meds well without incident. Daughter remains at bedside. Will continue to monitor.

## 2017-04-03 NOTE — ED Notes (Signed)
Pt taken to treatment room 1 by ED paramedic, Joneen Caraway; report called to Modena Nunnery, RN-pt's primary nurse

## 2017-04-03 NOTE — ED Notes (Signed)
Urine specimen obtained from pt's indwelling foley catheter bag; no port present on catheter tubing to draw from

## 2017-04-03 NOTE — Progress Notes (Signed)
Chaplain responded to an OR for Prayer for Pt. CH met with Pt. Pt talked about his family, his son's family struggles, his daughter, his professional education and professional life, and his current health challenges. Pt requested prayers for healing, for his son's marriage, for his grandchildren, and for his wife good health. Titus provided prayers and empathic of presence.     04/03/17 1200  Clinical Encounter Type  Visited With Patient  Visit Type Initial;Spiritual support  Referral From Nurse  Consult/Referral To Chaplain  Spiritual Encounters  Spiritual Needs Prayer

## 2017-04-03 NOTE — H&P (Addendum)
[name] is an 83 y.o. male.   Chief Complaint: Weakness HPI: The patient with past medical history of urinary retention with indwelling Foley catheter, SIADH and rheumatoid arthritis presents to the emergency department for weakness. The patient began feeling ill 4 days ago. He had multiple bowel movements which were formed but unusual and frequency. That day he had his Foley catheter changed. The following day the patient was very fatigued and becoming more weak. The following day he became more confused as well which prompted his family to bring him to the emergency department for evaluation area and urinalysis showed urinary tract infection and laboratory evaluation showed acute kidney injury. The patient denies nausea, vomiting or fever. After starting antibiotics emergency department staff called the hospitalist service for admission.  Past Medical History:  Diagnosis Date  . Allergy   . Anemia   . Anxiety   . Arthritis    rheumatoid  . Asthma   . CAD (coronary artery disease)   . Cancer [facility])    Prostate  . Chronic sinusitis   . Depression   . Diverticulitis   . ED (erectile dysfunction)   . GERD (gastroesophageal reflux disease)   . History of SIADH   . Hx of adenomatous colonic polyps   . Hypertension   . Hyponatremia   . IBS (irritable bowel syndrome)   . Neurodermatitis   . Neurogenic bladder   . OSA (obstructive sleep apnea)     Past Surgical History:  Procedure Laterality Date  . KNEE ARTHROPLASTY Right 09/02/2015   Procedure: COMPUTER ASSISTED TOTAL KNEE ARTHROPLASTY;  Surgeon: [name], MD;  Location: [facility] ORS;  Service: Orthopedics;  Laterality: Right;  . KNEE SURGERY Left    ARTHROSCOPY LEFT  . NASAL SINUS SURGERY  2009   DEVIATED SEPTUM AND POLYPS  . PROSTATE SURGERY     PROSTATECTOMY  . TOTAL KNEE ARTHROPLASTY Left 9/15   Dr [name]  . URETHRAL STRICTURE DILATATION  02-2010   Dr.Cope    Family History  Problem Relation Age of Onset  . Heart  disease Mother 45    heart failure  . Pneumonia Father 31    pnemonia  . Colon cancer Neg Hx    Social History:  reports that he has never smoked. He has never used smokeless tobacco. He reports that he drinks about 4.8 oz of alcohol per week . He reports that he does not use drugs.  Allergies:  Allergies  Allergen Reactions  . Ciprofloxacin Nausea And Vomiting    Headache  . Citalopram Hydrobromide Other (See Comments)    "unknown"  . Lorazepam     Adverse reaction  . Paroxetine Nausea Only  . Ramipril Other (See Comments)    "unknown"  . Simvastatin      (Not in a hospital admission)  Results for orders placed or performed during the hospital encounter of 04/03/17 (from the past 48 hour(s))  Basic metabolic panel     Status: Abnormal   Collection Time: 04/03/17  2:03 AM  Result Value Ref Range   Sodium 128 (L) 135 - 145 mmol/L   Potassium 4.9 3.5 - 5.1 mmol/L   Chloride 93 (L) 101 - 111 mmol/L   CO2 27 22 - 32 mmol/L   Glucose, Bld 108 (H) 65 - 99 mg/dL   BUN 55 (H) 6 - 20 mg/dL   Creatinine, Ser 1.99 (H) 0.61 - 1.24 mg/dL   Calcium 8.8 (L) 8.9 - 10.3 mg/dL   GFR  calc non Af Amer 30 (L) >60 mL/min   GFR calc Af Amer 35 (L) >60 mL/min    Comment: (NOTE) The eGFR has been calculated using the CKD EPI equation. This calculation has not been validated in all clinical situations. eGFR's persistently <60 mL/min signify possible Chronic Kidney Disease.    Anion gap 8 5 - 15  CBC     Status: Abnormal   Collection Time: 04/03/17  2:03 AM  Result Value Ref Range   WBC 8.6 3.8 - 10.6 K/uL   RBC 3.88 (L) 4.40 - 5.90 MIL/uL   Hemoglobin 13.0 13.0 - 18.0 g/dL   HCT 36.9 (L) 40.0 - 52.0 %   MCV 95.3 80.0 - 100.0 fL   MCH 33.6 26.0 - 34.0 pg   MCHC 35.3 32.0 - 36.0 g/dL   RDW 14.2 11.5 - 14.5 %   Platelets 102 (L) 150 - 440 K/uL  Urinalysis, Complete w Microscopic     Status: Abnormal   Collection Time: 04/03/17  2:03 AM  Result Value Ref Range   Color, Urine YELLOW  (A) YELLOW   APPearance CLOUDY (A) CLEAR   Specific Gravity, Urine 1.014 1.005 - 1.030   pH 5.0 5.0 - 8.0   Glucose, UA NEGATIVE NEGATIVE mg/dL   Hgb urine dipstick SMALL (A) NEGATIVE   Bilirubin Urine NEGATIVE NEGATIVE   Ketones, ur NEGATIVE NEGATIVE mg/dL   Protein, ur 100 (A) NEGATIVE mg/dL   Nitrite POSITIVE (A) NEGATIVE   Leukocytes, UA LARGE (A) NEGATIVE   RBC / HPF 6-30 0 - 5 RBC/hpf   WBC, UA TOO NUMEROUS TO COUNT 0 - 5 WBC/hpf   Bacteria, UA RARE (A) NONE SEEN   Squamous Epithelial / LPF 0-5 (A) NONE SEEN   WBC Clumps PRESENT    Mucous PRESENT    Budding Yeast PRESENT   Troponin I     Status: Abnormal   Collection Time: 04/03/17  2:03 AM  Result Value Ref Range   Troponin I 0.06 (HH) <0.03 ng/mL    Comment: CRITICAL RESULT CALLED TO, READ BACK BY AND VERIFIED WITH LEA FERGUESON AT 0310 04/03/17.PMH  Lactic acid, plasma     Status: None   Collection Time: 04/03/17  3:34 AM  Result Value Ref Range   Lactic Acid, Venous 1.1 0.5 - 1.9 mmol/L   No results found.  Review of Systems  Constitutional: Negative for chills and fever.  HENT: Negative for sore throat and tinnitus.   Eyes: Negative for blurred vision and redness.  Respiratory: Negative for cough and shortness of breath.   Cardiovascular: Negative for chest pain, palpitations, orthopnea and PND.  Gastrointestinal: Negative for abdominal pain, diarrhea, nausea and vomiting.  Genitourinary: Negative for dysuria, frequency and urgency.  Musculoskeletal: Negative for joint pain and myalgias.  Skin: Negative for rash.       No lesions  Neurological: Negative for speech change, focal weakness and weakness.  Endo/Heme/Allergies: Does not bruise/bleed easily.       No temperature intolerance  Psychiatric/Behavioral: Negative for depression and suicidal ideas.    Blood pressure (!) 117/58, pulse 88, temperature 98.6 F (37 C), temperature source Oral, resp. rate 18, height '5\' 11"'$  (1.803 m), weight 97.1 kg (214 lb 2  oz), SpO2 97 %. Physical Exam  Nursing note and vitals reviewed. Constitutional: He is oriented to person, place, and time. He appears well-developed and well-nourished. No distress.  HENT:  Head: Normocephalic and atraumatic.  Mouth/Throat: Oropharynx is clear and moist.  Eyes:  Conjunctivae and EOM are normal. Pupils are equal, round, and reactive to light. No scleral icterus.  Neck: Normal range of motion. Neck supple. No JVD present. No tracheal deviation present. No thyromegaly present.  Cardiovascular: Normal rate, regular rhythm and normal heart sounds.  Exam reveals no gallop and no friction rub.   No murmur heard. GI: Soft. Bowel sounds are normal. He exhibits no distension. There is no tenderness.  Genitourinary:  Genitourinary Comments: Deferred  Musculoskeletal: Normal range of motion. He exhibits no edema.  Lymphadenopathy:    He has no cervical adenopathy.  Neurological: He is alert and oriented to person, place, and time. No cranial nerve deficit.  Skin: Skin is warm and dry. No rash noted. No erythema.  Psychiatric: He has a normal mood and affect. His behavior is normal. Judgment and thought content normal.     Assessment/Plan This is an 83 year old male admitted for acute kidney injury. 1. AKI: Likely secondary to obstruction. We'll try to flush it with a Foley catheter as it is likely placed along the urethra or bladder wall. Hydrate with normal saline. Monitor improvement in kidney function. Avoid nephrotoxic agents. 2. Confusion: Secondary to urinary tract infection. Improving after antibiotics. 3. Urinary tract infection: Present on admission; likely secondary to instrumentation from Foley catheter replacement (indwelling). Continue ceftriaxone dosed by pharmacy for renal clearance. Follow cultures for growth and sensitivities. The patient does not meet criteria for sepsis. 4. Hyponatremia: Secondary to SIADH. The patient does not have neurologic deficits. I have  ordered him a regular diet without fluid restriction at this time however I have asked nephrology to see the patient for further recommendations. 5. Asthma: Controlled; the patient received Xolair shots every 15 days which control his symptoms. Albuterol as needed for now. 6. Rheumatoid arthritis: Continue Plaquenil and methotrexate. Hold NSAIDs due to acute kidney injury. 7. Hyperlipidemia: Continue statin therapy 8. DVT prophylaxis: Heparin 9. GI prophylaxis: Pantoprazole per home regimen The patient is a full code. Time spent on admission orders and patient care approximately 45 minutes  Harrie Foreman, MD 04/03/2017, 5:57 AM

## 2017-04-03 NOTE — ED Notes (Signed)
Patient resting quietly. Daughter remains at bedside.

## 2017-04-03 NOTE — ED Notes (Signed)
Pt transport to 157 

## 2017-04-03 NOTE — ED Notes (Addendum)
Date and time results received: 04/03/17 3:11 AM    Test: Troponin  Critical Value: 0.06  Name of Provider Notified: Jamal Collin, MD  Orders Received? Or Actions Taken?:No New Orders.

## 2017-04-03 NOTE — ED Provider Notes (Signed)
[facility] Emergency Department Provider Note   ____________________________________________   First MD Initiated Contact with Patient 04/03/17 [phone]     (approximate)  I have reviewed the triage vital signs and the nursing notes.   HISTORY  Chief Complaint Weakness; Fever; and Altered Mental Status    HPI [name] is a 83 y.o. male who presents to the ED from home with a chief complain of generalized weakness, fever, altered mentation and possible UTI. Patient has an indwelling Foley secondary to neurogenic bladder. Foley catheter was changed this week by his urologist. Complains of a 3-4 day history of low-grade fever, chills, generalized weakness. Family has noted some confusion and disorientation as well. Denies chest pain, shortness of breath, abdominal pain, nausea, vomiting, diarrhea. Denies recent travel or trauma.Nothing makes his symptoms better or worse.   Past Medical History:  Diagnosis Date  . Allergy   . Anemia   . Anxiety   . Arthritis    rheumatoid  . Asthma   . CAD (coronary artery disease)   . Cancer [facility])    Prostate  . Chronic sinusitis   . Depression   . Diverticulitis   . ED (erectile dysfunction)   . GERD (gastroesophageal reflux disease)   . History of SIADH   . Hx of adenomatous colonic polyps   . Hypertension   . Hyponatremia   . IBS (irritable bowel syndrome)   . Neurodermatitis   . Neurogenic bladder   . OSA (obstructive sleep apnea)     Patient Active Problem List   Diagnosis Date Noted  . Hyponatremia 01/16/2017  . UTI (urinary tract infection) 12/19/2016  . Asthma with acute exacerbation 11/14/2016  . Mood disorder ([place]) 01/18/2016  . Presence of indwelling urinary catheter 08/20/2015  . Advanced directives, counseling/discussion 07/28/2014  . Routine general medical examination at a health care facility 04/09/2012  . History of prostate cancer 04/09/2012  . Venous stasis dermatitis 06/13/2011  .  Chronic sinusitis   . CONSTIPATION, CHRONIC 12/07/2010  . Hyperlipemia 12/02/2010  . OSTEOARTHRITIS 12/02/2010  . NEUROGENIC BLADDER 05/24/2010  . IBS 05/05/2010  . PERSONAL HISTORY OF COLONIC POLYPS 05/05/2010  . NEUROPATHY 05/03/2010  . Essential hypertension, benign 03/25/2009  . GAD (generalized anxiety disorder) 01/27/2007  . Coronary atherosclerosis of native coronary artery 01/27/2007  . Allergic asthma 01/27/2007  . GERD 01/27/2007  . ERECTILE DYSFUNCTION, ORGANIC 01/27/2007  . Chronic rheumatic arthritis ([place]) 01/27/2007    Past Surgical History:  Procedure Laterality Date  . KNEE ARTHROPLASTY Right 09/02/2015   Procedure: COMPUTER ASSISTED TOTAL KNEE ARTHROPLASTY;  Surgeon: [name], MD;  Location: [facility] ORS;  Service: Orthopedics;  Laterality: Right;  . KNEE SURGERY Left    ARTHROSCOPY LEFT  . NASAL SINUS SURGERY  2009   DEVIATED SEPTUM AND POLYPS  . PROSTATE SURGERY     PROSTATECTOMY  . TOTAL KNEE ARTHROPLASTY Left 9/15   Dr [name]  . URETHRAL STRICTURE DILATATION  02-2010   Dr.Cope    Prior to Admission medications   Medication Sig Start Date End Date Taking? Authorizing Provider  albuterol (PROVENTIL HFA;VENTOLIN HFA) 108 (90 Base) MCG/ACT inhaler Inhale 2 puffs into the lungs every 6 (six) hours as needed for wheezing or shortness of breath. 11/14/16  Yes [name], MD  ALPRAZolam Duanne Moron) 0.5 MG tablet TAKE 1 TABLET BY MOUTH THREE TIMES DAILY AS NEEDED FOR ANXIETY 03/03/17  Yes [name], MD  aspirin 81 MG tablet Take 81 mg by  mouth daily.   Yes [provider]  Cholecalciferol (VITAMIN D3) 5000 UNITS CAPS Take 1 capsule by mouth daily.   Yes [provider]  Coenzyme Q10 (CO Q 10) 100 MG CAPS Take 1 capsule by mouth daily.   Yes [provider]  Cranberry 425 MG CAPS Take 2 capsules by mouth daily.   Yes [provider]  fluticasone (FLONASE) 50 MCG/ACT nasal spray Place 2 sprays into both nostrils 2 (two)  times daily.   Yes [provider]  FOLIC ACID PO Take 7,616 mcg by mouth daily.    Yes [provider]  furosemide (LASIX) 20 MG tablet TAKE 1 TABLET BY MOUTH EVERY DAY 06/30/16  Yes Viviana Simpler I, MD  gabapentin (NEURONTIN) 300 MG capsule TAKE 1 CAPSULE BY MOUTH THREE TIMES DAILY 07/12/16  Yes Viviana Simpler I, MD  hydrocortisone 2.5 % cream Apply topically 3 (three) times daily as needed. 12/19/16  Yes [name], MD  hydroxychloroquine (PLAQUENIL) 200 MG tablet Take 1 tablet by mouth 2 (two) times daily. 01/22/16  Yes [provider]  methotrexate (RHEUMATREX) 2.5 MG tablet Take 10 tablets by mouth once a week. 01/07/16  Yes [provider]  Milk Thistle 250 MG CAPS Take 1 capsule by mouth daily.   Yes [provider]  Multiple Vitamin (MULTIVITAMIN) tablet Take 1 tablet by mouth daily.     Yes [provider]  omeprazole (PRILOSEC) 20 MG capsule TAKE 1 CAPSULE BY MOUTH EVERY DAY Patient taking differently: TAKE 1 CAPSULE BY MOUTH AS NEEDED 03/24/16  Yes Viviana Simpler I, MD  pravastatin (PRAVACHOL) 20 MG tablet TAKE 1 TABLET BY MOUTH EVERY DAY 11/18/16  Yes [name], MD  Probiotic Product (ALIGN) 4 MG CAPS Take 1 capsule (4 mg total) by mouth daily. 12/15/16  Yes Gouru, Illene Silver, MD  Selenium 200 MCG CAPS Take 1 capsule by mouth daily.   Yes [provider]  solifenacin (VESICARE) 5 MG tablet Take 5 mg by mouth at bedtime. For bladder spasms   Yes [provider]  traMADol (ULTRAM) 50 MG tablet TAKE 1 TABLET BY MOUTH FOUR TIMES DAILY AS NEEDED 03/17/17  Yes [name], MD  Turmeric 500 MG CAPS Take 500 mg by mouth daily.   Yes [provider]  vitamin B-12 (CYANOCOBALAMIN) 500 MCG tablet Take 500 mcg by mouth daily.   Yes [provider]  polyethylene glycol (GLYCOLAX) packet Take 34 g by mouth daily.     [provider]    Allergies Ciprofloxacin; Citalopram hydrobromide;  Lorazepam; Paroxetine; Ramipril; and Simvastatin  Family History  Problem Relation Age of Onset  . Heart disease Mother 43    heart failure  . Pneumonia Father 26    pnemonia  . Colon cancer Neg Hx     Social History Social History  Substance Use Topics  . Smoking status: Never Smoker  . Smokeless tobacco: Never Used  . Alcohol use 4.8 oz/week    8 Standard drinks or equivalent per week    Review of Systems  Constitutional: Positive for fever/chills. Positive for generalized weakness. Eyes: No visual changes. ENT: No sore throat. Cardiovascular: Denies chest pain. Respiratory: Denies shortness of breath. Gastrointestinal: No abdominal pain.  No nausea, no vomiting.  No diarrhea.  No constipation. Genitourinary: Negative for dysuria. Musculoskeletal: Negative for back pain. Skin: Negative for rash. Neurological: Positive for altered mental state. Negative for headaches, focal weakness or numbness.   ____________________________________________   PHYSICAL  EXAM:  VITAL SIGNS: ED Triage Vitals  Enc Vitals Group     BP 04/03/17 0139 134/69     Pulse Rate 04/03/17 0139 92     Resp 04/03/17 0139 16     Temp 04/03/17 0139 98.6 F (37 C)     Temp Source 04/03/17 0139 Oral     SpO2 04/03/17 0139 96 %     Weight 04/03/17 0140 214 lb 2 oz (97.1 kg)     Height 04/03/17 0140 5\' 11"  (1.803 m)     Head Circumference --      Peak Flow --      Pain Score --      Pain Loc --      Pain Edu? --      Excl. in [place]? --     Constitutional: Alert and oriented. Well appearing and in no acute distress. Pleasant and talkative. Eyes: Conjunctivae are normal. PERRL. EOMI. Head: Atraumatic. Nose: No congestion/rhinnorhea. Mouth/Throat: Mucous membranes are moist.  Oropharynx non-erythematous. Neck: No stridor.  Supple neck without meningismus. Cardiovascular: Normal rate, regular rhythm. Grossly normal heart sounds.  Good peripheral circulation. Respiratory: Normal respiratory  effort.  No retractions. Lungs CTAB. Gastrointestinal: Soft and nontender. No distention. No abdominal bruits. No CVA tenderness. Musculoskeletal: No lower extremity tenderness nor edema.  No joint effusions. Neurologic:  Normal speech and language. No gross focal neurologic deficits are appreciated.  Skin:  Skin is warm, dry and intact. No rash noted. Psychiatric: Mood and affect are normal. Speech and behavior are normal.  ____________________________________________   LABS (all labs ordered are listed, but only abnormal results are displayed)  Labs Reviewed  BASIC METABOLIC PANEL - Abnormal; Notable for the following:       Result Value   Sodium 128 (*)    Chloride 93 (*)    Glucose, Bld 108 (*)    BUN 55 (*)    Creatinine, Ser 1.99 (*)    Calcium 8.8 (*)    GFR calc non Af Amer 30 (*)    GFR calc Af Amer 35 (*)    All other components within normal limits  CBC - Abnormal; Notable for the following:    RBC 3.88 (*)    HCT 36.9 (*)    Platelets 102 (*)    All other components within normal limits  URINALYSIS, COMPLETE (UACMP) WITH MICROSCOPIC - Abnormal; Notable for the following:    Color, Urine YELLOW (*)    APPearance CLOUDY (*)    Hgb urine dipstick SMALL (*)    Protein, ur 100 (*)    Nitrite POSITIVE (*)    Leukocytes, UA LARGE (*)    Bacteria, UA RARE (*)    Squamous Epithelial / LPF 0-5 (*)    All other components within normal limits  TROPONIN I - Abnormal; Notable for the following:    Troponin I 0.06 (*)    All other components within normal limits  CULTURE, BLOOD (ROUTINE X 2)  CULTURE, BLOOD (ROUTINE X 2)  URINE CULTURE  LACTIC ACID, PLASMA  LACTIC ACID, PLASMA   ____________________________________________  EKG  ED ECG REPORT I, Wylder Macomber J, the attending physician, personally viewed and interpreted this ECG.   Date: 04/03/2017  EKG Time: 0156  Rate: 93  Rhythm: normal EKG, normal sinus rhythm  Axis: RAD  Intervals:right bundle branch block   ST&T Change: Nonspecific  ____________________________________________  RADIOLOGY  None ____________________________________________   PROCEDURES  Procedure(s) performed: None  Procedures  Critical Care performed: No  ____________________________________________   INITIAL IMPRESSION / ASSESSMENT AND PLAN / ED COURSE  Pertinent labs & imaging results that were available during my care of the patient were reviewed by me and considered in my medical decision making (see chart for details).  83 year old male with indwelling Foley who presents with fever, generalized weakness and altered mentation. Laboratory and urinalysis results remarkable for UTI, hyponatremia, renal insufficiency and elevated troponin. Will initiate IV fluid resuscitation, IV antibiotics and discuss with hospitalist to evaluate patient in the emergency department for admission.      ____________________________________________   FINAL CLINICAL IMPRESSION(S) / ED DIAGNOSES  Final diagnoses:  Altered mental status, unspecified altered mental status type  Generalized weakness  Lower urinary tract infectious disease  Hyponatremia  Renal insufficiency  Elevated troponin      NEW MEDICATIONS STARTED DURING THIS VISIT:  New Prescriptions   No medications on file     Note:  This document was prepared using Dragon voice recognition software and may include unintentional dictation errors.    Paulette Blanch, MD 04/03/17 [phone]

## 2017-04-03 NOTE — Evaluation (Signed)
Physical Therapy Evaluation Patient Details Name: [name] MRN: [id number] DOB: July 16, 1934 Today's Date: 04/03/2017   History of Present Illness  presented to ER secondary to weakenss, AMS; admitted with AKI, UTI.  Clinical Impression  Patient very eager for OOB, wishing for transfer to toilet during session.  Demonstrates ability to complete bed mobility with min assist; sit/stand and basic transfers with min/mod assist (requiring UE support, multiple attempts and use of momentum to complete); min assist for gait (125') with RW.  Maintains forward trunk flexion, downward gaze and RW too far ahead of patient for majority of gait cycle; corrected temporarily with verbal cuing from therapist.  Mild R knee buckling as fatigue increases; high risk for buckling/fall. Notably fatigued after above gait distance; marked difficulty pacing activity and gauging level of fatigue.  Unsafe to complete without RW and +1 at this time. Would benefit from skilled PT to address above deficits and promote optimal return to PLOF; recommend transition to STR upon discharge from acute hospitalization.     Follow Up Recommendations SNF    Equipment Recommendations       Recommendations for Other Services       Precautions / Restrictions Precautions Precautions: Fall Restrictions Weight Bearing Restrictions: No      Mobility  Bed Mobility Overal bed mobility: Needs Assistance Bed Mobility: Supine to Sit     Supine to sit: Min assist        Transfers Overall transfer level: Needs assistance Equipment used: Rolling walker (2 wheeled) Transfers: Sit to/from Stand Sit to Stand: Min assist;Mod assist         General transfer comment: cuing for hand placement, multiple attempts and use of momentum to complete   Ambulation/Gait Ambulation/Gait assistance: Min assist Ambulation Distance (Feet): 125 Feet Assistive device: Rolling walker (2 wheeled)       General Gait Details: step through  gait pattern with decreased step height/length bilat; tends to maintain L LE in excessive IR throughout gait cycle with full body rotation towards R at times. Mild R LE buckling as fatigue increases; constant cuing for postural extension, upward gaze and walker position.  Unsafe to attempt without +1 hands-on assist at this time.  Stairs            Wheelchair Mobility    Modified Rankin (Stroke Patients Only)       Balance Overall balance assessment: Needs assistance Sitting-balance support: No upper extremity supported;Feet supported Sitting balance-[name]: Good     Standing balance support: Bilateral upper extremity supported Standing balance-[name]: Fair                               Pertinent Vitals/Pain Pain Assessment: No/denies pain    Home Living Family/patient expects to be discharged to:: Private residence Living Arrangements: Spouse/significant other Available Help at Discharge: Family Type of Home: House Home Access: Stairs to enter Entrance Stairs-Rails: Psychiatric nurse of Steps: 4 Home Layout: Two level;Able to live on main level with bedroom/bathroom Home Equipment: Gilford Rile - 2 wheels;Cane - single point      Prior Function Level of Independence: Independent with assistive device(s)         Comments: Mod indep for ADLs, household mobility with 4WRW; denies fall history.     Hand Dominance        Extremity/Trunk Assessment   Upper Extremity Assessment Upper Extremity Assessment: Overall WFL for tasks assessed    Lower Extremity  Assessment Lower Extremity Assessment: Generalized weakness (grossly 3/5 throughout bilat LEs, lacking approx 10-15 degrees of knee extension bilat knees)       Communication   Communication: HOH  Cognition Arousal/Alertness: Awake/alert Behavior During Therapy: WFL for tasks assessed/performed Overall Cognitive Status: Within Functional Limits for tasks assessed                                         General Comments      Exercises Other Exercises Other Exercises: Toilet transfer, ambulatory with RW, min assist; requires elevated toilet seat for safe completion of transfer.  Sit/stand from [facility] with grab bar, RW, cga/min assist; standing balance for hygiene, clothing managemnet, cga/min assist; dep for hygiene (to ensure cleanliness).  Standing balance at sink for hand hygiene, cga/min assist; min cuing for walker position/management.  Poor standing functional reach (indicative of increased fall risk), requriing at least unilateral UE support at all times for standing balance.   Assessment/Plan    PT Assessment Patient needs continued PT services  PT Problem List Decreased strength;Decreased range of motion;Decreased balance;Decreased activity tolerance;Decreased mobility;Decreased coordination;Decreased cognition;Decreased knowledge of use of DME;Decreased knowledge of precautions;Decreased safety awareness       PT Treatment Interventions DME instruction;Gait training;Stair training;Functional mobility training;Therapeutic activities;Therapeutic exercise;Balance training;Other (comment)    PT Goals (Current goals can be found in the Care Plan section)  Acute Rehab PT Goals Patient Stated Goal: to return home when i'm ready PT Goal Formulation: With patient Time For Goal Achievement: 04/17/17 Potential to Achieve Goals: Good    Frequency Min 2X/week   Barriers to discharge Decreased caregiver support      Co-evaluation               AM-PAC PT "6 Clicks" Daily Activity  Outcome Measure Difficulty turning over in bed (including adjusting bedclothes, sheets and blankets)?: Total Difficulty moving from lying on back to sitting on the side of the bed? : Total Difficulty sitting down on and standing up from a chair with arms (e.g., wheelchair, bedside commode, etc,.)?: Total Help needed moving to and from a bed to chair  (including a wheelchair)?: A Lot Help needed walking in hospital room?: A Little Help needed climbing 3-5 steps with a railing? : A Lot 6 Click Score: 10    End of Session Equipment Utilized During Treatment: Gait belt Activity Tolerance: Patient limited by fatigue Patient left: in chair;with call bell/phone within reach;with chair alarm set Nurse Communication: Mobility status PT Visit Diagnosis: Muscle weakness (generalized) (M62.81);Difficulty in walking, not elsewhere classified (R26.2)    Time: 0940-7680 PT Time Calculation (min) (ACUTE ONLY): 52 min   Charges:   PT Evaluation $PT Eval Low Complexity: 1 Procedure PT Treatments $Therapeutic Activity: 23-37 mins   PT G Codes:         Hodge Stachnik H. Owens Shark, PT, DPT, NCS 04/03/17, 5:20 PM [phone]

## 2017-04-03 NOTE — NC FL2 (Signed)
[place] LEVEL OF CARE SCREENING TOOL     IDENTIFICATION  Patient Name: [name] Birthdate: June 23, 1934 Sex: male Admission Date (Current Location): 04/03/2017  [place] and [place] Number:  Engineering geologist and Address:  [facility], [address] [place], [place] [address]      Provider Number: [id number]  Attending Physician Name and Address:  Fritzi Mandes, MD  Relative Name and Phone Number:       Current Level of Care: Hospital Recommended Level of Care: [place] Prior Approval Number:    Date Approved/Denied:   PASRR Number: [id number]  Discharge Plan: SNF    Current Diagnoses: Patient Active Problem List   Diagnosis Date Noted  . AKI (acute kidney injury) ([place]) 04/03/2017  . Hyponatremia 01/16/2017  . UTI (urinary tract infection) 12/19/2016  . Asthma with acute exacerbation 11/14/2016  . Mood disorder ([place]) 01/18/2016  . Presence of indwelling urinary catheter 08/20/2015  . Advanced directives, counseling/discussion 07/28/2014  . Routine general medical examination at a health care facility 04/09/2012  . History of prostate cancer 04/09/2012  . Venous stasis dermatitis 06/13/2011  . Chronic sinusitis   . CONSTIPATION, CHRONIC 12/07/2010  . Hyperlipemia 12/02/2010  . OSTEOARTHRITIS 12/02/2010  . NEUROGENIC BLADDER 05/24/2010  . IBS 05/05/2010  . PERSONAL HISTORY OF COLONIC POLYPS 05/05/2010  . NEUROPATHY 05/03/2010  . Essential hypertension, benign 03/25/2009  . GAD (generalized anxiety disorder) 01/27/2007  . Coronary atherosclerosis of native coronary artery 01/27/2007  . Allergic asthma 01/27/2007  . GERD 01/27/2007  . ERECTILE DYSFUNCTION, ORGANIC 01/27/2007  . Chronic rheumatic arthritis ([place]) 01/27/2007    Orientation RESPIRATION BLADDER Height & Weight     Self, Place  Normal Continent Weight: 222 lb 12.8 oz (101.1 kg) Height:  5\' 11"  (180.3 cm)  BEHAVIORAL SYMPTOMS/MOOD  NEUROLOGICAL BOWEL NUTRITION STATUS   (none)  (none) Continent Diet (regular)  AMBULATORY STATUS COMMUNICATION OF NEEDS Skin   Extensive Assist Verbally Normal                       Personal Care Assistance Level of Assistance  Bathing, Dressing Bathing Assistance: Limited assistance   Dressing Assistance: Limited assistance     Functional Limitations Info  Hearing   Hearing Info: Impaired      SPECIAL CARE FACTORS FREQUENCY  PT (By licensed PT)                    Contractures Contractures Info: Not present    Additional Factors Info  Code Status, Allergies Code Status Info: full Allergies Info: cipro, citalopram; hydrobromide; lorazepam; paroxetine; ramipril; simvistatin           Current Medications (04/03/2017):  This is the current hospital active medication list Current Facility-Administered Medications  Medication Dose Route Frequency Provider Last Rate Last Dose  . 0.9 %  sodium chloride infusion   Intravenous Continuous Fritzi Mandes, MD 85 mL/hr at 04/03/17 [phone]    . acetaminophen (TYLENOL) tablet 650 mg  650 mg Oral Q6H PRN Harrie Foreman, MD       Or  . acetaminophen (TYLENOL) suppository 650 mg  650 mg Rectal Q6H PRN Harrie Foreman, MD      . acidophilus (RISAQUAD) capsule 1 capsule  1 capsule Oral Daily Harrie Foreman, MD   1 capsule at 04/03/17 0904  . albuterol (PROVENTIL) (2.5 MG/3ML) 0.083% nebulizer solution 2.5 mg  2.5 mg Nebulization Q4H PRN Rosilyn Mings  S, MD      . ALPRAZolam Duanne Moron) tablet 0.5 mg  0.5 mg Oral TID PRN Harrie Foreman, MD      . aspirin chewable tablet 81 mg  81 mg Oral Daily Harrie Foreman, MD   81 mg at 04/03/17 0904  . cefTRIAXone (ROCEPHIN) 2 g in dextrose 5 % 50 mL IVPB  2 g Intravenous Q24H Harrie Foreman, MD   Stopped at 04/03/17 0932  . cholecalciferol (VITAMIN D) tablet 5,000 Units  5,000 Units Oral Daily Harrie Foreman, MD   5,000 Units at 04/03/17 [phone]  . docusate sodium (COLACE)  capsule 100 mg  100 mg Oral BID Harrie Foreman, MD   100 mg at 04/03/17 1859  . enoxaparin (LOVENOX) injection 40 mg  40 mg Subcutaneous Q24H Fritzi Mandes, MD      . fluticasone [facility]) 50 MCG/ACT nasal spray 2 spray  2 spray Each Nare BID Harrie Foreman, MD   2 spray at 04/03/17 1123  . folic acid (FOLVITE) tablet 1 mg  1,000 mcg Oral Daily Harrie Foreman, MD   1 mg at 04/03/17 0931  . gabapentin (NEURONTIN) capsule 300 mg  300 mg Oral TID Harrie Foreman, MD   300 mg at 04/03/17 0904  . hydrocortisone cream 1 %   Topical TID PRN Harrie Foreman, MD      . hydroxychloroquine (PLAQUENIL) tablet 200 mg  200 mg Oral BID Harrie Foreman, MD   200 mg at 04/03/17 1216  . [START ON 04/06/2017] methotrexate (RHEUMATREX) tablet 25 mg  25 mg Oral Weekly Harrie Foreman, MD      . multivitamin with minerals tablet 1 tablet  1 tablet Oral Daily Harrie Foreman, MD   1 tablet at 04/03/17 [phone]  . ondansetron (ZOFRAN) tablet 4 mg  4 mg Oral Q6H PRN Harrie Foreman, MD       Or  . ondansetron [facility]) injection 4 mg  4 mg Intravenous Q6H PRN Harrie Foreman, MD      . pantoprazole (PROTONIX) EC tablet 40 mg  40 mg Oral Daily Harrie Foreman, MD   40 mg at 04/03/17 9507  . polyethylene glycol (MIRALAX / GLYCOLAX) packet 34 g  34 g Oral Daily Harrie Foreman, MD   34 g at 04/03/17 0902  . pravastatin (PRAVACHOL) tablet 20 mg  20 mg Oral Daily Harrie Foreman, MD   20 mg at 04/03/17 2257  . traMADol (ULTRAM) tablet 50 mg  50 mg Oral QID PRN Harrie Foreman, MD      . vitamin B-12 (CYANOCOBALAMIN) tablet 500 mcg  500 mcg Oral Daily Harrie Foreman, MD   500 mcg at 04/03/17 5051     Discharge Medications: Please see discharge summary for a list of discharge medications.  Relevant Imaging Results:  Relevant Lab Results:   Additional Information ss: [id number]  Shela Leff, LCSW

## 2017-04-03 NOTE — Progress Notes (Signed)
[place] [facility] [place] NAME: [name]    MR#:  [id number]  DATE OF BIRTH:  January 10, 1934  SUBJECTIVE:    REVIEW OF SYSTEMS:   ROS Tolerating Diet: Tolerating PT:   DRUG ALLERGIES:   Allergies  Allergen Reactions  . Ciprofloxacin Nausea And Vomiting    Headache  . Citalopram Hydrobromide Other (See Comments)    "unknown"  . Lorazepam     Adverse reaction  . Paroxetine Nausea Only  . Ramipril Other (See Comments)    "unknown"  . Simvastatin     VITALS:  Blood pressure (!) 123/52, pulse 94, temperature 100 F (37.8 C), temperature source Oral, resp. rate 19, height 5\' 11"  (1.803 m), weight 101.1 kg (222 lb 12.8 oz), SpO2 90 %.  PHYSICAL EXAMINATION:   Physical Exam  GENERAL:  83 y.o.-year-old patient lying in the bed with no acute distress.  EYES: Pupils equal, round, reactive to light and accommodation. No scleral icterus. Extraocular muscles intact.  HEENT: Head atraumatic, normocephalic. Oropharynx and nasopharynx clear.  NECK:  Supple, no jugular venous distention. No thyroid enlargement, no tenderness.  LUNGS: Normal breath sounds bilaterally, no wheezing, rales, rhonchi. No use of accessory muscles of respiration.  CARDIOVASCULAR: S1, S2 normal. No murmurs, rubs, or gallops.  ABDOMEN: Soft, nontender, nondistended. Bowel sounds present. No organomegaly or mass.  EXTREMITIES: No cyanosis, clubbing or edema b/l.    NEUROLOGIC: Cranial nerves II through XII are intact. No focal Motor or sensory deficits b/l.   PSYCHIATRIC:  patient is alert and oriented x 3.  SKIN: No obvious rash, lesion, or ulcer.   LABORATORY PANEL:  CBC  Recent Labs Lab 04/03/17 0203  WBC 8.6  HGB 13.0  HCT 36.9*  PLT 102*    Chemistries   Recent Labs Lab 04/03/17 0203  NA 128*  K 4.9  CL 93*  CO2 27  GLUCOSE 108*  BUN 55*  CREATININE 1.99*  CALCIUM 8.8*   Cardiac Enzymes  Recent Labs Lab 04/03/17 0203  TROPONINI 0.06*    RADIOLOGY:  No results found. ASSESSMENT AND PLAN:  83 year old male admitted for acute kidney injury.  1. AKI: due to dehydration from poor po intake -- Hydrate with normal saline. Monitor improvement in kidney function. Avoid nephrotoxic agents. -creat 1.99---BMP -baseline creat 0.9  2. Confusion: Secondary to urinary tract infection. Improving after antibiotics. -improving  3. Urinary tract infection: suspected from chronic Foley catheter replacement (indwelling) due to h/o Neurogenic Bladder - Continue ceftriaxone  4. Hyponatremia: Secondary to SIADH. The patient does not have neurologic deficits.  -cont ivf   5. Asthma: Controlled; the patient received Xolair shots every 15 days which control his symptoms. Albuterol as needed for now.  6. Rheumatoid arthritis: Continue Plaquenil and methotrexate. Hold NSAIDs due to acute kidney injury.  7. Hyperlipidemia: Continue statin therapy  8. DVT prophylaxis: lovenox  PT to see CM/CSW for d/c planning  Case discussed with Care Management/Social Worker. Management plans discussed with the patient, family and they are in agreement.  CODE STATUS: FULL  DVT Prophylaxis: lovenox  TOTAL TIME TAKING CARE OF THIS PATIENT: *30* minutes.  >50% time spent on counselling and coordination of care  POSSIBLE D/C IN *1-2* DAYS, DEPENDING ON CLINICAL CONDITION.  Note: This dictation was prepared with Dragon dictation along with smaller phrase technology. Any transcriptional errors that result from this process are unintentional.  Kimmberly Wisser M.D on 04/03/2017 at 8:02 AM  Between 7am to 6pm - Pager -  [phone]  After 6pm go to [email or web address] - password [email or web address] [facility]  Office  [phone]  CC: Primary care physician; [name], MD

## 2017-04-03 NOTE — ED Triage Notes (Signed)
Patient with complaint of weakness, fever and some confusion that started on Friday.

## 2017-04-03 NOTE — Progress Notes (Signed)
PHARMACIST - PHYSICIAN ORDER COMMUNICATION  CONCERNING: P&T Medication Policy on Herbal Medications  DESCRIPTION:  This patient's order for:  Co-Q10, Selenium, milk thistle, turmeric, cranberry  has been noted.  This product(s) is classified as an "herbal" or natural product. Due to a lack of definitive safety studies or FDA approval, nonstandard manufacturing practices, plus the potential risk of unknown drug-drug interactions while on inpatient medications, the Pharmacy and Therapeutics Committee does not permit the use of "herbal" or natural products of this type within [facility]   ACTION TAKEN: The pharmacy department is unable to verify this order at this time. Please reevaluate patient's clinical condition at discharge and address if the herbal or natural product(s) should be resumed at that time.

## 2017-04-04 ENCOUNTER — Telehealth: Payer: Self-pay | Admitting: Internal Medicine

## 2017-04-04 ENCOUNTER — Encounter
Admission: RE | Admit: 2017-04-04 | Discharge: 2017-04-04 | Disposition: A | Discharge status: Home / Self Care | Payer: Medicare PPO | Source: Ambulatory Visit | Attending: Internal Medicine | Admitting: Internal Medicine

## 2017-04-04 LAB — HEMOGLOBIN A1C
Hgb A1c MFr Bld: 5 % (ref 4.8–5.6)
Mean Plasma Glucose: 97 mg/dL

## 2017-04-04 NOTE — Progress Notes (Signed)
Chaplain made a follow up visit with the Pt. Pt's son was at the bedside. Pt stated he was feeling much better today than yesterday. Pt was hopeful that he would be moved to rehab soon. Pt thanked [facility] for visiting him again today.    04/04/17 1500  Clinical Encounter Type  Visited With Patient;Family  Visit Type Follow-up;Spiritual support  Referral From [place]

## 2017-04-04 NOTE — Clinical Social Work Placement (Signed)
   CLINICAL SOCIAL WORK PLACEMENT  NOTE  Date:  04/04/2017  Patient Details  Name: [name] MRN: [id number] Date of Birth: [birth year]  Clinical Social Work is seeking post-discharge placement for this patient at the [place] level of care (*CSW will initial, date and re-position this form in  chart as items are completed):  Yes   Patient/family provided with [place] Work Department's list of facilities offering this level of care within the geographic area requested by the patient (or if unable, by the patient's family).  Yes   Patient/family informed of their freedom to choose among providers that offer the needed level of care, that participate in Medicare, Medicaid or managed care program needed by the patient, have an available bed and are willing to accept the patient.  Yes   Patient/family informed of [place]'s ownership interest in [facility] and [facility], as well as of the fact that they are under no obligation to receive care at these facilities.  PASRR submitted to EDS on       PASRR number received on       Existing PASRR number confirmed on 04/03/17     FL2 transmitted to all facilities in geographic area requested by pt/family on 04/03/17     FL2 transmitted to all facilities within larger geographic area on       Patient informed that his/her managed care company has contracts with or will negotiate with certain facilities, including the following:        Yes   Patient/family informed of bed offers received.  Patient chooses bed at [facility]     Physician recommends and patient chooses bed at      Patient to be transferred to [facility] on  .  Patient to be transferred to facility by       Patient family notified on   of transfer.  Name of family member notified:        PHYSICIAN       Additional Comment:    _______________________________________________ Darden Dates, LCSW 04/04/2017,  12:03 PM

## 2017-04-04 NOTE — Progress Notes (Signed)
Initial Nutrition Assessment  DOCUMENTATION CODES:   Obesity unspecified  INTERVENTION:  1. Monitor for needs, patient ok at this point  NUTRITION DIAGNOSIS:   Inadequate oral intake related to poor appetite as evidenced by per patient/family report.  GOAL:   Patient will meet greater than or equal to 90% of their needs  MONITOR:   PO intake, I & O's, Labs, Weight trends, Supplement acceptance  REASON FOR ASSESSMENT:   Malnutrition Screening Tool    ASSESSMENT:   The patient with past medical history of urinary retention with indwelling Foley catheter, SIADH and rheumatoid arthritis presents to the emergency department for weakness.  Spoke with patient at bedside. He reports poor appetite x3 days PTA where he was consuming nothing. His appetite has since improved, PO intake between 50-100%, ate most of his meal for lunch today. Tries to maintain his weight between 215-220# states he was encouraged by his PCP to get down to 200# but he does not feel comfortable at that weight. Denies any issues chewing/swallowing/choking BMs have become more formed, less frequent.  Nutrition-Focused physical exam completed. Findings are no fat depletion, no muscle depletion, and no edema.  Labs and medications reviewed: Na 129 Colace, Vitamin D, Folic Acid, MVI w/ Minerals, Miralax NS @ 61mL/hr  Diet Order:  Diet regular Room service appropriate? Yes; Fluid consistency: Thin  Skin:  Reviewed, no issues  Last BM:  PTA  Height:   Ht Readings from Last 1 Encounters:  04/03/17 5\' 11"  (1.803 m)    Weight:   Wt Readings from Last 1 Encounters:  04/04/17 223 lb (101.2 kg)    Ideal Body Weight:  78.18 kg  BMI:  Body mass index is 31.1 kg/m.  Estimated Nutritional Needs:   Kcal:  1700-2000 calories (ABW x25-30 cal/kg)  Protein:  101-121 gm  Fluid:  >/= 1.7L  EDUCATION NEEDS:   No education needs identified at this time  Satira Anis. Lien Lyman, MS, RD LDN Inpatient  Clinical Dietitian Pager [phone]

## 2017-04-04 NOTE — Clinical Social Work Note (Signed)
Clinical Social Work Assessment  Patient Details  Name: [name] MRN: [id number] Date of Birth: Jun 28, 1934  Date of referral:  04/04/17               Reason for consult:  Facility Placement, Discharge Planning                Permission sought to share information with:  Family Supports Permission granted to share information::  Yes, Verbal Permission Granted  Name::     Saurabh Hettich  Relationship::  wife  Contact Information:  [phone]  Housing/Transportation Living arrangements for the past 2 months:  Single Family Home Source of Information:  Patient Patient Interpreter Needed:  None Criminal Activity/Legal Involvement Pertinent to Current Situation/Hospitalization:  No - Comment as needed Significant Relationships:  Adult Children, Spouse Lives with:  Spouse Do you feel safe going back to the place where you live?  Yes Need for family participation in patient care:  No (Coment)  Care giving concerns:  No care giving concerns identified.   Social Worker assessment / plan:  CSW met with pt to address consult for New SNF. CSW introduced herself and explained role of social work. CSW also explained the process of discharging to SNF with [facility]. SNF search initiated and CSW followed up with bed offers. Pt chose [facility]. CSW updated facility and secured a private room when pt is stable for discharge. Facility will also start [facility]. CSW will continue to follow.   Employment status:  Retired Nurse, adult PT Recommendations:  [place] / Referral to community resources:  [place]  Patient/Family's Response to care:  Pt was appreciative of CSW support.   Patient/Family's Understanding of and Emotional Response to Diagnosis, Current Treatment, and Prognosis:  Pt understands that he needs STR prior to returning home.   Emotional Assessment Appearance:  Appears stated  age Attitude/Demeanor/Rapport:   (Appropriate) Affect (typically observed):  Accepting, Adaptable, Pleasant Orientation:  Oriented to Situation, Oriented to Place, Oriented to Self Alcohol / Substance use:  Not Applicable Psych involvement (Current and /or in the community):  No (Comment)  Discharge Needs  Concerns to be addressed:  Adjustment to Illness Readmission within the last 30 days:  No Current discharge risk:  Chronically ill Barriers to Discharge:  Continued Medical Work up   [employer], LCSW 04/04/2017, 11:59 AM

## 2017-04-04 NOTE — Progress Notes (Signed)
[place] at [place] NAME: [name]    MR#:  [id number]  DATE OF BIRTH:  06-Jul-1934  SUBJECTIVE:   No complaint except weakness. REVIEW OF SYSTEMS:   Review of Systems  Constitutional: Negative for chills and fever.  HENT: Negative for congestion.   Eyes: Negative for blurred vision and double vision.  Respiratory: Negative for cough, shortness of breath, wheezing and stridor.   Cardiovascular: Negative for chest pain.  Gastrointestinal: Negative for abdominal pain, blood in stool, constipation, diarrhea, melena, nausea and vomiting.  Genitourinary: Negative for flank pain and hematuria.  Musculoskeletal: Negative for back pain.  Skin: Negative for itching and rash.  Neurological: Negative for dizziness, focal weakness and loss of consciousness.  Psychiatric/Behavioral: Negative for depression. The patient is not nervous/anxious.    Tolerating Diet: yes  Tolerating PT: yes  DRUG ALLERGIES:   Allergies  Allergen Reactions  . Ciprofloxacin Nausea And Vomiting    Headache  . Citalopram Hydrobromide Other (See Comments)    "unknown"  . Lorazepam     Adverse reaction  . Paroxetine Nausea Only  . Ramipril Other (See Comments)    "unknown"  . Simvastatin     VITALS:  Blood pressure (!) 144/65, pulse 71, temperature 98.3 F (36.8 C), temperature source Axillary, resp. rate 18, height 5\' 11"  (1.803 m), weight 223 lb (101.2 kg), SpO2 93 %.  PHYSICAL EXAMINATION:   Physical Exam  Constitutional: He is oriented to person, place, and time and well-developed, well-nourished, and in no distress.  HENT:  Head: Normocephalic.  Mouth/Throat: Oropharynx is clear and moist.  Eyes: Conjunctivae and EOM are normal. No scleral icterus.  Neck: Normal range of motion. Neck supple. No JVD present. No tracheal deviation present.  Cardiovascular: Normal rate, regular rhythm, normal heart sounds and intact distal pulses.  Exam reveals no  gallop.   No murmur heard. Pulmonary/Chest: Effort normal and breath sounds normal. No respiratory distress. He has no wheezes. He has no rales.  Abdominal: Soft. Bowel sounds are normal. He exhibits no distension. There is no tenderness.  Musculoskeletal: Normal range of motion. He exhibits no edema or tenderness.  Neurological: He is alert and oriented to person, place, and time. No cranial nerve deficit.  Skin: No rash noted. No erythema.  Psychiatric: Affect normal.    LABORATORY PANEL:  CBC  Recent Labs Lab 04/03/17 0203  WBC 8.6  HGB 13.0  HCT 36.9*  PLT 102*    Chemistries   Recent Labs Lab 04/03/17 0840  NA 129*  K 4.1  CL 96*  CO2 26  GLUCOSE 98  BUN 48*  CREATININE 1.56*  CALCIUM 8.4*   Cardiac Enzymes  Recent Labs Lab 04/03/17 0203  TROPONINI 0.06*   RADIOLOGY:  No results found. ASSESSMENT AND PLAN:  83 year old male admitted for acute kidney injury.  1. AKI: due to dehydration from poor po intake -- Hydrate with normal saline. Monitor improvement in kidney function. Avoid nephrotoxic agents. -creat 1.99, improving. f/u BMP. -baseline creat 0.9  2. Confusion: Secondary to urinary tract infection. Improving after antibiotics. -improved.  3. Urinary tract infection: suspected from chronic Foley catheter replacement (indwelling) due to h/o Neurogenic Bladder - Continue ceftriaxone  4. Hyponatremia: Secondary to SIADH. The patient does not have neurologic deficits.  -cont NS iv, f/u BMP.  5. Asthma: Controlled; the patient received Xolair shots every 15 days which control his symptoms. Albuterol as needed for now.  6. Rheumatoid arthritis: Continue Plaquenil  and methotrexate. Hold NSAIDs due to acute kidney injury.  7. Hyperlipidemia: Continue statin therapy  8. DVT prophylaxis: lovenox  PT: SNF placement.  Case discussed with Care Management/Social Worker. Management plans discussed with the patient, family and they are in  agreement.  CODE STATUS: FULL  DVT Prophylaxis: lovenox  TOTAL TIME TAKING CARE OF THIS PATIENT: 36 minutes.  >50% time spent on counselling and coordination of care  POSSIBLE D/C IN 1-2 DAYS, DEPENDING ON CLINICAL CONDITION.  Note: This dictation was prepared with Dragon dictation along with smaller phrase technology. Any transcriptional errors that result from this process are unintentional.  Demetrios Loll M.D on 04/04/2017 at 4:48 PM  Between 7am to 6pm - Pager - [phone]  After 6pm go to [email or web address] - password [email or web address] [facility]  Office  [phone]  CC: Primary care physician; [name], MD

## 2017-04-04 NOTE — Progress Notes (Addendum)
Pt's wife called, this Probation officer informed her that pt got upset and scared when awaken to take scheduled night time pills. Pt took his pills by chewing them, leaving a bad taste in his mouth. Writer informed pt's wife of the prescribed Xanax for anxiety, wife said okay to give for tonight. Pt's needs attended, will administer PRN Xanax and continue to monitor.

## 2017-04-04 NOTE — Progress Notes (Signed)
Physical Therapy Treatment Patient Details Name: BONNY EGGER MRN: [id number] DOB: 1934-05-12 Today's Date: 04/04/2017    History of Present Illness presented to ER secondary to weakenss, AMS; admitted with AKI, UTI.    PT Comments    Pt resting in bed with c/o bilateral flank pain that was relieved after mobility.  To edge of bed with mod a 1 with use of rails and HOB raised.  Pt was able to stand with mod a x 1.  He initially had difficulty obtaining his balance.  He was then able to ambulate 200' with walker and min assist with 2nd assist for chair and iv pole.  While pt is able to ambulate increased distances, he relies heavily on walker and has LE weakness limiting gait and balance.  Stairs would be challenging for pt at this time with an increased risk for falls.  SNF remains an appropriate discharge plan at this time to increase overall functional mobility skills.   Follow Up Recommendations  SNF     Equipment Recommendations       Recommendations for Other Services       Precautions / Restrictions Precautions Precautions: Fall Restrictions Weight Bearing Restrictions: No    Mobility  Bed Mobility Overal bed mobility: Needs Assistance Bed Mobility: Supine to Sit     Supine to sit: Mod assist        Transfers Overall transfer level: Needs assistance Equipment used: Rolling walker (2 wheeled) Transfers: Sit to/from Stand Sit to Stand: Mod assist            Ambulation/Gait Ambulation/Gait assistance: Min assist Ambulation Distance (Feet): 200 Feet Assistive device: Rolling walker (2 wheeled) Gait Pattern/deviations: Step-through pattern   Gait velocity interpretation: Below normal speed for age/gender     Stairs            Wheelchair Mobility    Modified Rankin (Stroke Patients Only)       Balance Overall balance assessment: Needs assistance Sitting-balance support: No upper extremity supported;Feet supported Sitting balance-Leahy  Scale: Good     Standing balance support: Bilateral upper extremity supported Standing balance-[name]: Fair                              Cognition Arousal/Alertness: Awake/alert Behavior During Therapy: WFL for tasks assessed/performed Overall Cognitive Status: Within Functional Limits for tasks assessed                                        Exercises      General Comments        Pertinent Vitals/Pain Pain Assessment: 0-10 Pain Score: 4  Pain Location: bilateral flank pain at rest - relieved with mobility Pain Descriptors / Indicators: Sore Pain Intervention(s): Monitored during session    Home Living                      Prior Function            PT Goals (current goals can now be found in the care plan section) Progress towards PT goals: Progressing toward goals    Frequency    Min 2X/week      PT Plan      Co-evaluation              AM-PAC PT "6 Clicks" Daily Activity  Outcome  Measure  Difficulty turning over in bed (including adjusting bedclothes, sheets and blankets)?: Total Difficulty moving from lying on back to sitting on the side of the bed? : Total Difficulty sitting down on and standing up from a chair with arms (e.g., wheelchair, bedside commode, etc,.)?: Total Help needed moving to and from a bed to chair (including a wheelchair)?: A Lot Help needed walking in hospital room?: A Little Help needed climbing 3-5 steps with a railing? : A Lot 6 Click Score: 10    End of Session Equipment Utilized During Treatment: Gait belt Activity Tolerance: Patient limited by fatigue Patient left: in chair;with call bell/phone within reach;with chair alarm set         Time: 7493-5521 PT Time Calculation (min) (ACUTE ONLY): 33 min  Charges:  $Gait Training: 8-22 mins $Therapeutic Activity: 8-22 mins                    G Codes:       Chesley Noon, PTA 04/04/17, 10:59 AM

## 2017-04-04 NOTE — Telephone Encounter (Signed)
Patient's daughter,Melinda,called to let Dr.Letvak know patient's at [facility] with UTI and hallucinations,dehydration.

## 2017-04-05 DIAGNOSIS — N179 Acute kidney failure, unspecified: Secondary | ICD-10-CM | POA: Diagnosis not present

## 2017-04-05 DIAGNOSIS — N390 Urinary tract infection, site not specified: Secondary | ICD-10-CM | POA: Diagnosis not present

## 2017-04-05 DIAGNOSIS — I10 Essential (primary) hypertension: Secondary | ICD-10-CM | POA: Diagnosis not present

## 2017-04-05 DIAGNOSIS — E871 Hypo-osmolality and hyponatremia: Secondary | ICD-10-CM | POA: Diagnosis present

## 2017-04-05 DIAGNOSIS — M069 Rheumatoid arthritis, unspecified: Secondary | ICD-10-CM | POA: Diagnosis not present

## 2017-04-05 DIAGNOSIS — J4550 Severe persistent asthma, uncomplicated: Secondary | ICD-10-CM | POA: Diagnosis not present

## 2017-04-05 DIAGNOSIS — I2510 Atherosclerotic heart disease of native coronary artery without angina pectoris: Secondary | ICD-10-CM | POA: Diagnosis not present

## 2017-04-05 DIAGNOSIS — R262 Difficulty in walking, not elsewhere classified: Secondary | ICD-10-CM | POA: Diagnosis not present

## 2017-04-05 DIAGNOSIS — E785 Hyperlipidemia, unspecified: Secondary | ICD-10-CM | POA: Diagnosis not present

## 2017-04-05 DIAGNOSIS — R410 Disorientation, unspecified: Secondary | ICD-10-CM | POA: Diagnosis not present

## 2017-04-05 DIAGNOSIS — T83511D Infection and inflammatory reaction due to indwelling urethral catheter, subsequent encounter: Secondary | ICD-10-CM | POA: Diagnosis not present

## 2017-04-05 DIAGNOSIS — Z743 Need for continuous supervision: Secondary | ICD-10-CM | POA: Diagnosis not present

## 2017-04-05 DIAGNOSIS — N319 Neuromuscular dysfunction of bladder, unspecified: Secondary | ICD-10-CM | POA: Diagnosis not present

## 2017-04-05 DIAGNOSIS — Z466 Encounter for fitting and adjustment of urinary device: Secondary | ICD-10-CM | POA: Diagnosis not present

## 2017-04-05 DIAGNOSIS — E222 Syndrome of inappropriate secretion of antidiuretic hormone: Secondary | ICD-10-CM | POA: Diagnosis not present

## 2017-04-05 DIAGNOSIS — M6281 Muscle weakness (generalized): Secondary | ICD-10-CM | POA: Diagnosis not present

## 2017-04-05 LAB — BASIC METABOLIC PANEL
Anion gap: 2 — ABNORMAL LOW (ref 5–15)
BUN: 20 mg/dL (ref 6–20)
CHLORIDE: 106 mmol/L (ref 101–111)
CO2: 27 mmol/L (ref 22–32)
Calcium: 7.8 mg/dL — ABNORMAL LOW (ref 8.9–10.3)
Creatinine, Ser: 0.75 mg/dL (ref 0.61–1.24)
GFR calc Af Amer: >60 mL/min (ref >60)
GFR calc non Af Amer: >60 mL/min (ref >60)
GLUCOSE: 113 mg/dL — AB (ref 65–99)
POTASSIUM: 4 mmol/L (ref 3.5–5.1)
Sodium: 135 mmol/L (ref 135–145)

## 2017-04-05 LAB — URINE CULTURE: Culture: >=100000 — AB

## 2017-04-05 MED ORDER — CEPHALEXIN 500 MG PO CAPS: 500.0000 mg | ORAL_CAPSULE | Freq: Four times a day (QID) | ORAL | 0 refills | Status: DC

## 2017-04-05 MED ORDER — TRAMADOL HCL 50 MG PO TABS: 50.0000 mg | ORAL_TABLET | Freq: Four times a day (QID) | ORAL | 0 refills | Status: DC | PRN

## 2017-04-05 MED ORDER — CIPROFLOXACIN HCL 500 MG PO TABS
500.0000 mg | ORAL_TABLET | Freq: Two times a day (BID) | ORAL | Status: DC
  Administered 2017-04-05: 500 mg via ORAL
  Filled 2017-04-05: qty 1

## 2017-04-05 MED ORDER — CIPROFLOXACIN HCL 500 MG PO TABS: 500.0000 mg | ORAL_TABLET | Freq: Two times a day (BID) | ORAL | 0 refills | Status: DC

## 2017-04-05 NOTE — Clinical Social Work Note (Signed)
Pt is ready for discharge today and will go to [facility]. [employer] gave [place]. Facility is ready to admit pt as discharge information has been received. Pt and wife are aware and agreeable to discharge plan. RN will call report. [facility] will provide transportation. CSW is signing off as no further needs identified.   Darden Dates, MSW, LCSW Clinical Social Worker  [phone]

## 2017-04-05 NOTE — Discharge Instructions (Signed)
Heart healthy diet. °Fall precaution. °

## 2017-04-05 NOTE — Telephone Encounter (Signed)
Please let her know that I spoke to him directly and that he sounded lucid again. I know he will probably be going for rehab after the hospital

## 2017-04-05 NOTE — Telephone Encounter (Signed)
Spoke to [place]. She said he is sounding better, also.

## 2017-04-05 NOTE — Discharge Summary (Addendum)
[place] at [place] NAME: [name]    MR#:  [id number]  DATE OF BIRTH:  11-11-1934  DATE OF ADMISSION:  04/03/2017   ADMITTING PHYSICIAN: Harrie Foreman, MD  DATE OF DISCHARGE:  04/05/2017  PRIMARY CARE PHYSICIAN: [name], MD   ADMISSION DIAGNOSIS:  Lower urinary tract infectious disease [N39.0] Hyponatremia [E87.1] Renal insufficiency [N28.9] Elevated troponin [R74.8] Generalized weakness [R53.1] Extrinsic asthma, moderate persistent, uncomplicated [M62.94] Altered mental status, unspecified altered mental status type [R41.82] DISCHARGE DIAGNOSIS:  Active Problems:   AKI (acute kidney injury) ([place]) UTI SECONDARY DIAGNOSIS:   Past Medical History:  Diagnosis Date  . Allergy   . Anemia   . Anxiety   . Arthritis    rheumatoid  . Asthma   . CAD (coronary artery disease)   . Cancer [facility])    Prostate  . Chronic sinusitis   . Depression   . Diverticulitis   . ED (erectile dysfunction)   . GERD (gastroesophageal reflux disease)   . History of SIADH   . Hx of adenomatous colonic polyps   . Hypertension   . Hyponatremia   . IBS (irritable bowel syndrome)   . Neurodermatitis   . Neurogenic bladder   . OSA (obstructive sleep apnea)    HOSPITAL COURSE:   82 year old male admitted for acute kidney injury.  1. AKI: due to dehydration from poor po intake --Hydrate with normal saline. Monitor improvement in kidney function. Avoid nephrotoxic agents. Hold lasix. -creat was1.99, improved with NS iv. -baseline creat 0.9  2. Confusion: Secondary to urinary tract infection. Improving after antibiotics. -improved.  3. Urinary tract infection: suspected fromchronic Foley catheter replacement (indwelling) due to h/o Neurogenic Bladder Treated with ceftriaxone, urine culture: ENTEROBACTER SPECIES , sensitive to cipro, change to cipro after discharge.  4. Hyponatremia: Secondary to SIADH. The patient does not  have neurologic deficits.  Improved.  5. Asthma: Controlled; the patient received Xolair shots every 15 days which control his symptoms. Albuterol as needed for now.  6. Rheumatoid arthritis: Continue Plaquenil and methotrexate. Hold NSAIDs due to acute kidney injury.  7. Hyperlipidemia: Continue statin therapy PT: SNF. DISCHARGE CONDITIONS:  Stable, discharge to SNF today. CONSULTS OBTAINED:   DRUG ALLERGIES:   Allergies  Allergen Reactions  . Ciprofloxacin Nausea And Vomiting    Headache  . Citalopram Hydrobromide Other (See Comments)    "unknown"  . Lorazepam     Adverse reaction  . Paroxetine Nausea Only  . Ramipril Other (See Comments)    "unknown"  . Simvastatin    DISCHARGE MEDICATIONS:   Allergies as of 04/05/2017      Reactions   Ciprofloxacin Nausea And Vomiting   Headache   Citalopram Hydrobromide Other (See Comments)   "unknown"   Lorazepam    Adverse reaction   Paroxetine Nausea Only   Ramipril Other (See Comments)   "unknown"   Simvastatin       Medication List    STOP taking these medications   ALPRAZolam 0.5 MG tablet Commonly known as:  XANAX   furosemide 20 MG tablet Commonly known as:  LASIX     TAKE these medications   albuterol 108 (90 Base) MCG/ACT inhaler Commonly known as:  PROVENTIL HFA;VENTOLIN HFA Inhale 2 puffs into the lungs every 6 (six) hours as needed for wheezing or shortness of breath.   ALIGN 4 MG Caps Take 1 capsule (4 mg total) by mouth daily.   aspirin 81 MG  tablet Take 81 mg by mouth daily.   ciprofloxacin 500 MG tablet Commonly known as:  CIPRO Take 1 tablet (500 mg total) by mouth 2 (two) times daily.   Co Q 10 100 MG Caps Take 1 capsule by mouth daily.   Cranberry 425 MG Caps Take 2 capsules by mouth daily.   fluticasone 50 MCG/ACT nasal spray Commonly known as:  FLONASE Place 2 sprays into both nostrils 2 (two) times daily.   FOLIC ACID PO Take 5,284 mcg by mouth daily.   gabapentin 300 MG  capsule Commonly known as:  NEURONTIN TAKE 1 CAPSULE BY MOUTH THREE TIMES DAILY   hydrocortisone 2.5 % cream Apply topically 3 (three) times daily as needed.   hydroxychloroquine 200 MG tablet Commonly known as:  PLAQUENIL Take 1 tablet by mouth 2 (two) times daily.   methotrexate 2.5 MG tablet Commonly known as:  RHEUMATREX Take 10 tablets by mouth once a week.   Milk Thistle 250 MG Caps Take 1 capsule by mouth daily.   multivitamin tablet Take 1 tablet by mouth daily.   omeprazole 20 MG capsule Commonly known as:  PRILOSEC TAKE 1 CAPSULE BY MOUTH EVERY DAY What changed:  See the new instructions.   polyethylene glycol packet Commonly known as:  MIRALAX / GLYCOLAX Take 34 g by mouth daily.   pravastatin 20 MG tablet Commonly known as:  PRAVACHOL TAKE 1 TABLET BY MOUTH EVERY DAY   Selenium 200 MCG Caps Take 1 capsule by mouth daily.   solifenacin 5 MG tablet Commonly known as:  VESICARE Take 5 mg by mouth at bedtime. For bladder spasms   traMADol 50 MG tablet Commonly known as:  ULTRAM Take 1 tablet (50 mg total) by mouth 4 (four) times daily as needed for moderate pain. What changed:  See the new instructions.   Turmeric 500 MG Caps Take 500 mg by mouth daily.   vitamin B-12 500 MCG tablet Commonly known as:  CYANOCOBALAMIN Take 500 mcg by mouth daily.   Vitamin D3 5000 units Caps Take 1 capsule by mouth daily.        DISCHARGE INSTRUCTIONS:  See AVS. If you experience worsening of your admission symptoms, develop shortness of breath, life threatening emergency, suicidal or homicidal thoughts you must seek medical attention immediately by calling 911 or calling your MD immediately  if symptoms less severe.  You Must read complete instructions/literature along with all the possible adverse reactions/side effects for all the Medicines you take and that have been prescribed to you. Take any new Medicines after you have completely understood and accpet all  the possible adverse reactions/side effects.   Please note  You were cared for by a hospitalist during your hospital stay. If you have any questions about your discharge medications or the care you received while you were in the hospital after you are discharged, you can call the unit and asked to speak with the hospitalist on call if the hospitalist that took care of you is not available. Once you are discharged, your primary care physician will handle any further medical issues. Please note that NO REFILLS for any discharge medications will be authorized once you are discharged, as it is imperative that you return to your primary care physician (or establish a relationship with a primary care physician if you do not have one) for your aftercare needs so that they can reassess your need for medications and monitor your lab values.    On the day of Discharge:  VITAL SIGNS:  Blood pressure (!) 144/53, pulse 79, temperature 98.5 F (36.9 C), temperature source Oral, resp. rate 16, height 5\' 11"  (1.803 m), weight 224 lb 3.2 oz (101.7 kg), SpO2 98 %. PHYSICAL EXAMINATION:  GENERAL:  83 y.o.-year-old patient lying in the bed with no acute distress.  EYES: Pupils equal, round, reactive to light and accommodation. No scleral icterus. Extraocular muscles intact.  HEENT: Head atraumatic, normocephalic. Oropharynx and nasopharynx clear.  NECK:  Supple, no jugular venous distention. No thyroid enlargement, no tenderness.  LUNGS: Normal breath sounds bilaterally, no wheezing, rales,rhonchi or crepitation. No use of accessory muscles of respiration.  CARDIOVASCULAR: S1, S2 normal. No murmurs, rubs, or gallops.  ABDOMEN: Soft, non-tender, non-distended. Bowel sounds present. No organomegaly or mass.  EXTREMITIES: No pedal edema, cyanosis, or clubbing.  NEUROLOGIC: Cranial nerves II through XII are intact. Muscle strength 5/5 in all extremities. Sensation intact. Gait not checked.  PSYCHIATRIC: The patient is  alert and oriented x 3.  SKIN: No obvious rash, lesion, or ulcer.  DATA REVIEW:   CBC  Recent Labs Lab 04/03/17 0203  WBC 8.6  HGB 13.0  HCT 36.9*  PLT 102*    Chemistries   Recent Labs Lab 04/05/17 0616  NA 135  K 4.0  CL 106  CO2 27  GLUCOSE 113*  BUN 20  CREATININE 0.75  CALCIUM 7.8*     Microbiology Results  Results for orders placed or performed during the hospital encounter of 04/03/17  Urine culture     Status: Abnormal (Preliminary result)   Collection Time: 04/03/17  2:03 AM  Result Value Ref Range Status   Specimen Description URINE, RANDOM  Final   Special Requests NONE  Final   Culture (A)  Final    >=100,000 COLONIES/mL ENTEROBACTER SPECIES SUSCEPTIBILITIES TO FOLLOW Performed at [facility], [place] [address], [place], [place] [address]    Report Status PENDING  Incomplete  Culture, blood (routine x 2)     Status: None (Preliminary result)   Collection Time: 04/03/17  3:34 AM  Result Value Ref Range Status   Specimen Description BLOOD LEFT ARM  Final   Special Requests   Final    BOTTLES DRAWN AEROBIC AND ANAEROBIC Blood Culture adequate volume   Culture NO GROWTH 2 DAYS  Final   Report Status PENDING  Incomplete  Culture, blood (routine x 2)     Status: None (Preliminary result)   Collection Time: 04/03/17  8:40 AM  Result Value Ref Range Status   Specimen Description BLOOD R AC  Final   Special Requests   Final    BOTTLES DRAWN AEROBIC AND ANAEROBIC Blood Culture adequate volume   Culture NO GROWTH 2 DAYS  Final   Report Status PENDING  Incomplete    RADIOLOGY:  No results found.   Management plans discussed with the patient, family and they are in agreement.  CODE STATUS: Full Code   TOTAL TIME TAKING CARE OF THIS PATIENT: 33 minutes.    Demetrios Loll M.D on 04/05/2017 at 8:22 AM  Between 7am to 6pm - Pager - [phone]  After 6pm go to [email or web address] - Proofreader  [facility]ospital[facility]  Office   [phone]  CC: Primary care physician; [name], MD   Note: This dictation was prepared with Dragon dictation along with smaller phrase technology. Any transcriptional errors that result from this process are unintentional.

## 2017-04-05 NOTE — Discharge Planning (Addendum)
Patient IV removed.  Discharge papers given, explained and educated.  Informed of suggested FU appts and appts made.  Scripts signed and in packet. Discharge instructions also in packet.Report called to Janeann Merl, RN at [facility] - going to room 502.  RN assessment and VS revealed stability for DC.  EMS contacted to transport - waiting on arrival.  Family in room and aware of DC to facility.

## 2017-04-05 NOTE — Clinical Social Work Placement (Signed)
   CLINICAL SOCIAL WORK PLACEMENT  NOTE  Date:  04/05/2017  Patient Details  Name: [name] MRN: [id number] Date of Birth: [birth year]  Clinical Social Work is seeking post-discharge placement for this patient at the [place] level of care (*CSW will initial, date and re-position this form in  chart as items are completed):  Yes   Patient/family provided with [facility] Work Department's list of facilities offering this level of care within the geographic area requested by the patient (or if unable, by the patient's family).  Yes   Patient/family informed of their freedom to choose among providers that offer the needed level of care, that participate in Medicare, Medicaid or managed care program needed by the patient, have an available bed and are willing to accept the patient.  Yes   Patient/family informed of [facility]'s ownership interest in [facility] and [facility], as well as of the fact that they are under no obligation to receive care at these facilities.  PASRR submitted to EDS on       PASRR number received on       Existing PASRR number confirmed on 04/03/17     FL2 transmitted to all facilities in geographic area requested by pt/family on 04/03/17     FL2 transmitted to all facilities within larger geographic area on       Patient informed that his/her managed care company has contracts with or will negotiate with certain facilities, including the following:        Yes   Patient/family informed of bed offers received.  Patient chooses bed at [facility]     Physician recommends and patient chooses bed at      Patient to be transferred to [facility] on 04/05/17.  Patient to be transferred to facility by [facility]     Patient family notified on 04/05/17 of transfer.  Name of family member notified:  Pt's wife     PHYSICIAN       Additional Comment:     _______________________________________________ Darden Dates, LCSW 04/05/2017, 12:06 PM

## 2017-04-06 DIAGNOSIS — N390 Urinary tract infection, site not specified: Secondary | ICD-10-CM | POA: Diagnosis not present

## 2017-04-06 DIAGNOSIS — N179 Acute kidney failure, unspecified: Secondary | ICD-10-CM | POA: Diagnosis not present

## 2017-04-06 DIAGNOSIS — E871 Hypo-osmolality and hyponatremia: Secondary | ICD-10-CM | POA: Diagnosis not present

## 2017-04-06 DIAGNOSIS — T83511D Infection and inflammatory reaction due to indwelling urethral catheter, subsequent encounter: Secondary | ICD-10-CM | POA: Diagnosis not present

## 2017-04-08 LAB — CULTURE, BLOOD (ROUTINE X 2)
Culture: NO GROWTH
Culture: NO GROWTH
Special Requests: ADEQUATE
Special Requests: ADEQUATE

## 2017-04-10 ENCOUNTER — Ambulatory Visit (INDEPENDENT_AMBULATORY_CARE_PROVIDER_SITE_OTHER): Payer: Medicare PPO

## 2017-04-10 DIAGNOSIS — J4550 Severe persistent asthma, uncomplicated: Secondary | ICD-10-CM

## 2017-04-10 MED ORDER — OMALIZUMAB 150 MG ~~LOC~~ SOLR
375.0000 mg | Freq: Once | SUBCUTANEOUS | Status: AC
  Administered 2017-04-10: 375 mg via SUBCUTANEOUS

## 2017-04-10 NOTE — Progress Notes (Signed)
Ms. Bankhead came in on 5.14.18 to receive a Xolair injection. The patient is given 375mg  every 14 days. Due to each vial equalling 150mg , 15mg  of medication was wasted.

## 2017-04-11 ENCOUNTER — Other Ambulatory Visit
Admission: RE | Admit: 2017-04-11 | Discharge: 2017-04-11 | Disposition: A | Discharge status: Home / Self Care | Payer: Medicare PPO | Source: Ambulatory Visit | Attending: Gerontology | Admitting: Gerontology

## 2017-04-11 DIAGNOSIS — E871 Hypo-osmolality and hyponatremia: Secondary | ICD-10-CM | POA: Diagnosis present

## 2017-04-11 LAB — BASIC METABOLIC PANEL WITH GFR
Anion gap: 7 (ref 5–15)
BUN: 9 mg/dL (ref 6–20)
CO2: 28 mmol/L (ref 22–32)
Calcium: 8 mg/dL — ABNORMAL LOW (ref 8.9–10.3)
Chloride: 95 mmol/L — ABNORMAL LOW (ref 101–111)
Creatinine, Ser: 0.64 mg/dL (ref 0.61–1.24)
GFR calc Af Amer: >60 mL/min
GFR calc non Af Amer: >60 mL/min
Glucose, Bld: 80 mg/dL (ref 65–99)
Potassium: 4.1 mmol/L (ref 3.5–5.1)
Sodium: 130 mmol/L — ABNORMAL LOW (ref 135–145)

## 2017-04-12 ENCOUNTER — Non-Acute Institutional Stay (SKILLED_NURSING_FACILITY): Payer: Medicare PPO | Admitting: Gerontology

## 2017-04-12 DIAGNOSIS — N390 Urinary tract infection, site not specified: Secondary | ICD-10-CM | POA: Diagnosis not present

## 2017-04-12 DIAGNOSIS — E222 Syndrome of inappropriate secretion of antidiuretic hormone: Secondary | ICD-10-CM | POA: Diagnosis not present

## 2017-04-13 ENCOUNTER — Other Ambulatory Visit: Payer: Self-pay | Admitting: Internal Medicine

## 2017-04-13 ENCOUNTER — Other Ambulatory Visit
Admission: RE | Admit: 2017-04-13 | Discharge: 2017-04-13 | Disposition: A | Discharge status: Home / Self Care | Payer: Medicare PPO | Source: Ambulatory Visit | Attending: Gerontology | Admitting: Gerontology

## 2017-04-13 DIAGNOSIS — E871 Hypo-osmolality and hyponatremia: Secondary | ICD-10-CM | POA: Diagnosis present

## 2017-04-13 LAB — COMPREHENSIVE METABOLIC PANEL
ALT: 23 U/L (ref 17–63)
AST: 24 U/L (ref 15–41)
Albumin: 3.1 g/dL — ABNORMAL LOW (ref 3.5–5.0)
Alkaline Phosphatase: 54 U/L (ref 38–126)
Anion gap: 6 (ref 5–15)
BILIRUBIN TOTAL: 0.6 mg/dL (ref 0.3–1.2)
BUN: 11 mg/dL (ref 6–20)
CO2: 30 mmol/L (ref 22–32)
Calcium: 8.7 mg/dL — ABNORMAL LOW (ref 8.9–10.3)
Chloride: 92 mmol/L — ABNORMAL LOW (ref 101–111)
Creatinine, Ser: 0.77 mg/dL (ref 0.61–1.24)
GFR calc Af Amer: >60 mL/min (ref >60)
Glucose, Bld: 96 mg/dL (ref 65–99)
POTASSIUM: 4.8 mmol/L (ref 3.5–5.1)
Sodium: 128 mmol/L — ABNORMAL LOW (ref 135–145)
TOTAL PROTEIN: 6.4 g/dL — AB (ref 6.5–8.1)

## 2017-04-13 LAB — CBC WITH DIFFERENTIAL/PLATELET
BASOS ABS: 0.1 10*3/uL (ref 0–0.1)
BASOS PCT: 1 %
EOS ABS: 0.1 10*3/uL (ref 0–0.7)
EOS PCT: 1 %
HEMATOCRIT: 37.1 % — AB (ref 40.0–52.0)
Hemoglobin: 12.9 g/dL — ABNORMAL LOW (ref 13.0–18.0)
Lymphocytes Relative: 12 %
Lymphs Abs: 1.2 10*3/uL (ref 1.0–3.6)
MCH: 33.2 pg (ref 26.0–34.0)
MCHC: 34.8 g/dL (ref 32.0–36.0)
MCV: 95.4 fL (ref 80.0–100.0)
MONO ABS: 0.5 10*3/uL (ref 0.2–1.0)
MONOS PCT: 5 %
NEUTROS ABS: 8.3 10*3/uL — AB (ref 1.4–6.5)
Neutrophils Relative %: 81 %
Platelets: 252 10*3/uL (ref 150–440)
RBC: 3.89 MIL/uL — ABNORMAL LOW (ref 4.40–5.90)
RDW: 14.3 % (ref 11.5–14.5)
WBC: 10.2 10*3/uL (ref 3.8–10.6)

## 2017-04-14 ENCOUNTER — Ambulatory Visit: Payer: Medicare PPO | Admitting: Internal Medicine

## 2017-04-14 NOTE — Telephone Encounter (Signed)
Approved: #90 x 0 He is definitely still on this, and needs it

## 2017-04-14 NOTE — Telephone Encounter (Signed)
Left refill on voice mail at pharmacy  

## 2017-04-14 NOTE — Telephone Encounter (Signed)
Medication was not on med list. Taken off in hospital from what I can tell. Last filled 03-03-17 #90 Last OV 01-16-17 Next OV 04-27-17.

## 2017-04-17 DIAGNOSIS — E871 Hypo-osmolality and hyponatremia: Secondary | ICD-10-CM | POA: Diagnosis not present

## 2017-04-17 DIAGNOSIS — E222 Syndrome of inappropriate secretion of antidiuretic hormone: Secondary | ICD-10-CM | POA: Insufficient documentation

## 2017-04-17 DIAGNOSIS — R601 Generalized edema: Secondary | ICD-10-CM | POA: Diagnosis not present

## 2017-04-17 DIAGNOSIS — N179 Acute kidney failure, unspecified: Secondary | ICD-10-CM | POA: Diagnosis not present

## 2017-04-17 DIAGNOSIS — R600 Localized edema: Secondary | ICD-10-CM | POA: Diagnosis not present

## 2017-04-17 DIAGNOSIS — N390 Urinary tract infection, site not specified: Secondary | ICD-10-CM | POA: Diagnosis not present

## 2017-04-17 NOTE — Progress Notes (Signed)
Location:      Place of Service:  SNF (31) Provider:  Toni Arthurs, NP-C  [name], MD  Patient Care Team: [name], MD as PCP - General  Extended Emergency Contact Information Primary Emergency Contact: Wack,[name] Address: [address]          [place], [place] [address] Johnnette Litter of [place] Phone: [phone] Mobile Phone: [phone] Relation: Spouse Secondary Emergency Contact: Desiree Hane [place] of [place] Phone: [phone] Relation: Son  Code Status:  full Goals of care: Advanced Directive information Advanced Directives 04/03/2017  Does Patient Have a Medical Advance Directive? Yes  Type of Advance Directive Living will;Healthcare Power of Attorney  Does patient want to make changes to medical advance directive? No - Patient declined  Copy of [place] in Chart? -  Would patient like information on creating a medical advance directive? -     Chief Complaint  Patient presents with  . Follow-up    HPI:  Pt is a 83 y.o. male seen today for follow up on UTI and SIADH. Pt is also seen as a discharge evaluation as pt is scheduled to discharge home on Saturday. Today, pt had questions about the result of labs drawn yesterday. I printed a copy of lab results to give pt. We discussed the hyponatremia and potential causes. We discussed the plan for correction of the hyponatremia. Since he is scheduled for dc in a few days, pt is aware he needs to f/u with pcp asap after discharge to continue managing medications and monitoring lab results. Pt reports he is not having any more symptoms of UTi. Denies n/v/d/f/c/cp/aob/ha/abd pain/dizziness/cough/CV tenderness. No confusion. Pt reports he is feeling well and can't wait to discharge home. He ambulates the halls using the rolling walker. He reports he is eating well, voiding well and having regular BMs. All questions answered regarding labs and plan of care. VSS. No other  complaints.      Past Medical History:  Diagnosis Date  . Allergy   . Anemia   . Anxiety   . Arthritis    rheumatoid  . Asthma   . CAD (coronary artery disease)   . Cancer [facility])    Prostate  . Chronic sinusitis   . Depression   . Diverticulitis   . ED (erectile dysfunction)   . GERD (gastroesophageal reflux disease)   . History of SIADH   . Hx of adenomatous colonic polyps   . Hypertension   . Hyponatremia   . IBS (irritable bowel syndrome)   . Neurodermatitis   . Neurogenic bladder   . OSA (obstructive sleep apnea)    Past Surgical History:  Procedure Laterality Date  . KNEE ARTHROPLASTY Right 09/02/2015   Procedure: COMPUTER ASSISTED TOTAL KNEE ARTHROPLASTY;  Surgeon: [name], MD;  Location: [facility] ORS;  Service: Orthopedics;  Laterality: Right;  . KNEE SURGERY Left    ARTHROSCOPY LEFT  . NASAL SINUS SURGERY  2009   DEVIATED SEPTUM AND POLYPS  . PROSTATE SURGERY     PROSTATECTOMY  . TOTAL KNEE ARTHROPLASTY Left 9/15   Dr [name]  . URETHRAL STRICTURE DILATATION  02-2010   Dr.Cope    Allergies  Allergen Reactions  . Ciprofloxacin Nausea And Vomiting    Headache  . Citalopram Hydrobromide Other (See Comments)    "unknown"  . Lorazepam     Adverse reaction  . Paroxetine Nausea Only  . Ramipril Other (See Comments)    "unknown"  .  Simvastatin     Allergies as of 04/12/2017      Reactions   Ciprofloxacin Nausea And Vomiting   Headache   Citalopram Hydrobromide Other (See Comments)   "unknown"   Lorazepam    Adverse reaction   Paroxetine Nausea Only   Ramipril Other (See Comments)   "unknown"   Simvastatin       Medication List       Accurate as of 04/12/17 11:59 PM. Always use your most recent med list.          albuterol 108 (90 Base) MCG/ACT inhaler Commonly known as:  PROVENTIL HFA;VENTOLIN HFA Inhale 2 puffs into the lungs every 6 (six) hours as needed for wheezing or shortness of breath.   ALIGN 4 MG Caps Take 1 capsule (4 mg  total) by mouth daily.   aspirin 81 MG tablet Take 81 mg by mouth daily.   ciprofloxacin 500 MG tablet Commonly known as:  CIPRO Take 1 tablet (500 mg total) by mouth 2 (two) times daily.   Co Q 10 100 MG Caps Take 1 capsule by mouth daily.   Cranberry 425 MG Caps Take 2 capsules by mouth daily.   fluticasone 50 MCG/ACT nasal spray Commonly known as:  FLONASE Place 2 sprays into both nostrils 2 (two) times daily.   FOLIC ACID PO Take 8,299 mcg by mouth daily.   gabapentin 300 MG capsule Commonly known as:  NEURONTIN TAKE 1 CAPSULE BY MOUTH THREE TIMES DAILY   hydrocortisone 2.5 % cream Apply topically 3 (three) times daily as needed.   hydroxychloroquine 200 MG tablet Commonly known as:  PLAQUENIL Take 1 tablet by mouth 2 (two) times daily.   methotrexate 2.5 MG tablet Commonly known as:  RHEUMATREX Take 10 tablets by mouth once a week.   Milk Thistle 250 MG Caps Take 1 capsule by mouth daily.   multivitamin tablet Take 1 tablet by mouth daily.   omeprazole 20 MG capsule Commonly known as:  PRILOSEC TAKE 1 CAPSULE BY MOUTH EVERY DAY   polyethylene glycol packet Commonly known as:  MIRALAX / GLYCOLAX Take 34 g by mouth daily.   pravastatin 20 MG tablet Commonly known as:  PRAVACHOL TAKE 1 TABLET BY MOUTH EVERY DAY   Selenium 200 MCG Caps Take 1 capsule by mouth daily.   solifenacin 5 MG tablet Commonly known as:  VESICARE Take 5 mg by mouth at bedtime. For bladder spasms   traMADol 50 MG tablet Commonly known as:  ULTRAM Take 1 tablet (50 mg total) by mouth 4 (four) times daily as needed for moderate pain.   Turmeric 500 MG Caps Take 500 mg by mouth daily.   vitamin B-12 500 MCG tablet Commonly known as:  CYANOCOBALAMIN Take 500 mcg by mouth daily.   Vitamin D3 5000 units Caps Take 1 capsule by mouth daily.       Review of Systems  Constitutional: Negative for activity change, appetite change, chills, diaphoresis and fever.  HENT:  Negative for congestion, sneezing, sore throat, trouble swallowing and voice change.   Respiratory: Negative for apnea, cough, choking, chest tightness, shortness of breath and wheezing.   Cardiovascular: Negative for chest pain, palpitations and leg swelling.  Gastrointestinal: Negative for abdominal distention, abdominal pain, constipation, diarrhea and nausea.  Genitourinary: Negative for difficulty urinating, dysuria, frequency and urgency.  Musculoskeletal: Negative for back pain, gait problem and myalgias. Arthralgias: typical arthritis.  Skin: Negative for color change, pallor, rash and wound.  Neurological: Negative for dizziness,  tremors, syncope, speech difficulty, weakness, numbness and headaches.  Psychiatric/Behavioral: Negative for agitation and behavioral problems.  All other systems reviewed and are negative.   Immunization History  Administered Date(s) Administered  . Influenza Split 09/26/2011  . Influenza Whole 09/07/2007, 08/21/2008, 09/01/2009, 08/23/2010  . Influenza,inj,Quad PF,36+ Mos 09/04/2013, 07/28/2014, 08/20/2015, 08/08/2016  . Influenza-Unspecified 09/06/2012  . Pneumococcal Conjugate-13 07/28/2014  . Pneumococcal Polysaccharide-23 04/09/2012  . Td 07/13/2009   Pertinent  Health Maintenance Due  Topic Date Due  . INFLUENZA VACCINE  06/28/2017  . PNA vac Low Risk Adult  Completed   Fall Risk  08/20/2015 07/28/2014 01/29/2014 07/12/2013  Falls in the past year? No Yes Yes Yes  Number falls in past yr: - 2 or more 1 1  Injury with Fall? - - No No  Risk Factor Category  - High Fall Risk - -  Risk for fall due to : - History of fall(s);Impaired balance/gait;Impaired mobility Impaired balance/gait;Impaired mobility Impaired balance/gait;Impaired mobility   Functional Status Survey:    Vitals:   04/12/17 1700  BP: 138/65  Pulse: 66  Resp: 18  Temp: 97.7 F (36.5 C)  SpO2: 96%   There is no height or weight on file to calculate BMI. Physical Exam    Constitutional: He is oriented to person, place, and time. Vital signs are normal. He appears well-developed and well-nourished. He is active and cooperative. He does not appear ill. No distress.  HENT:  Head: Normocephalic and atraumatic.  Mouth/Throat: Uvula is midline, oropharynx is clear and moist and mucous membranes are normal. Mucous membranes are not pale, not dry and not cyanotic.  Eyes: Conjunctivae, EOM and lids are normal. Pupils are equal, round, and reactive to light.  Neck: Trachea normal, normal range of motion and full passive range of motion without pain. Neck supple. No JVD present. No tracheal deviation, no edema and no erythema present. No thyromegaly present.  Cardiovascular: Normal rate, regular rhythm, normal heart sounds, intact distal pulses and normal pulses.  Exam reveals no gallop, no distant heart sounds and no friction rub.   No murmur heard. Pulses:      Dorsalis pedis pulses are 2+ on the right side, and 2+ on the left side.  No edema  Pulmonary/Chest: Effort normal and breath sounds normal. No accessory muscle usage. No respiratory distress. He has no wheezes. He has no rhonchi. He has no rales. He exhibits no tenderness.  Abdominal: Soft. Normal appearance and bowel sounds are normal. He exhibits no distension and no ascites. There is no tenderness. There is no CVA tenderness.  Musculoskeletal: Normal range of motion. He exhibits no edema or tenderness.  Expected osteoarthritis, stiffness  Neurological: He is alert and oriented to person, place, and time. He has normal strength.  Skin: Skin is warm, dry and intact. He is not diaphoretic. No cyanosis. No pallor. Nails show no clubbing.  Psychiatric: He has a normal mood and affect. His speech is normal and behavior is normal. Judgment and thought content normal. Cognition and memory are normal.  Nursing note and vitals reviewed.   Labs reviewed:  Recent Labs  01/16/17 1333  04/05/17 0616 04/11/17 0558  04/13/17 1115  NA 137  < > 135 130* 128*  K 4.5  < > 4.0 4.1 4.8  CL 98  < > 106 95* 92*  CO2 34*  < > '27 28 30  '$ GLUCOSE 92  < > 113* 80 96  BUN 17  < > '20 9 11  '$ CREATININE  0.68  < > 0.75 0.64 0.77  CALCIUM 9.0  < > 7.8* 8.0* 8.7*  PHOS 3.6  --   --   --   --   < > = values in this interval not displayed.  Recent Labs  05/18/16 1344 12/11/16 1506 01/16/17 1333 04/13/17 1115  AST 18 18  --  24  ALT 15 11*  --  23  ALKPHOS 62 52  --  54  BILITOT 0.4 1.3*  --  0.6  PROT 6.1 6.2*  --  6.4*  ALBUMIN 3.8 3.5 3.7 3.1*    Recent Labs  12/11/16 1506  01/16/17 1333 04/03/17 0203 04/13/17 1115  WBC 16.8*  < > 5.8 8.6 10.2  NEUTROABS 15.3*  --  4.1  --  8.3*  HGB 13.6  < > 12.9* 13.0 12.9*  HCT 38.5*  < > 37.4* 36.9* 37.1*  MCV 94.8  < > 95.9 95.3 95.4  PLT 139*  < > 170.0 102* 252  < > = values in this interval not displayed. Lab Results  Component Value Date   TSH 3.598 04/03/2017   Lab Results  Component Value Date   HGBA1C 5.0 04/03/2017   Lab Results  Component Value Date   CHOL 156 01/26/2015   HDL 62.60 01/26/2015   LDLCALC 82 01/26/2015   TRIG 56.0 01/26/2015   CHOLHDL 2 01/26/2015    Significant Diagnostic Results in last 30 days:  No results found.  Assessment/Plan 1. SIADH (syndrome of inappropriate ADH production) (HCC)  1 liter/ day free water restriction  Gatorade on lunch and supper trays  No sodium restriction  Sodium Chloride 1 Gram po Q Day  Follow up with pcp asap after discharge for continuity of care and recheck Na+ level  2. Urinary tract infection without hematuria, site unspecified  Complete coarse of antibiotics  Resolved   Family/ staff Communication:   Total Time:  Documentation:  Face to Face:  Family/Phone:   Labs/tests ordered:  Cbc, met c  Patient is being discharged with the following home health services:  HHPT/OT  Patient is being discharged with the following durable medical equipment: none    Patient has been advised to f/u with their PCP in 1-2 weeks to bring them up to date on their rehab stay.  Social services at facility was responsible for arranging this appointment.  Pt was provided with a 30 day supply of prescriptions for medications and refills must be obtained from their PCP.  For controlled substances, a more limited supply may be provided adequate until PCP appointment only.  Medication list reviewed and assessed for continued appropriateness. Monthly medication orders reviewed and signed.  Vikki Ports, NP-C [facility] [phone],  [address] Cell Phone (Mon-Fri 8am-5pm):  [phone] On Call:  [phone] & follow prompts after 5pm & weekends Office Phone:  [phone] Office Fax:  [phone]

## 2017-04-18 ENCOUNTER — Telehealth: Payer: Self-pay

## 2017-04-18 NOTE — Telephone Encounter (Signed)
Yes that is fine

## 2017-04-18 NOTE — Telephone Encounter (Signed)
Darlina Guys with [facility] left v/m; when there is a 32 hour or more delay for [facility] calls to verify it is OK for [facility] to go out on 04/20/17 to eval pt. Kecia request cb.

## 2017-04-19 NOTE — Telephone Encounter (Signed)
Spoke to [employer]

## 2017-04-25 ENCOUNTER — Ambulatory Visit: Payer: Medicare PPO

## 2017-04-25 ENCOUNTER — Ambulatory Visit (INDEPENDENT_AMBULATORY_CARE_PROVIDER_SITE_OTHER): Payer: Medicare PPO

## 2017-04-25 DIAGNOSIS — J4540 Moderate persistent asthma, uncomplicated: Secondary | ICD-10-CM | POA: Diagnosis not present

## 2017-04-25 DIAGNOSIS — E871 Hypo-osmolality and hyponatremia: Secondary | ICD-10-CM | POA: Diagnosis not present

## 2017-04-26 DIAGNOSIS — N319 Neuromuscular dysfunction of bladder, unspecified: Secondary | ICD-10-CM | POA: Diagnosis not present

## 2017-04-26 MED ORDER — OMALIZUMAB 150 MG ~~LOC~~ SOLR
375.0000 mg | SUBCUTANEOUS | Status: DC
  Administered 2017-04-25: 375 mg via SUBCUTANEOUS

## 2017-04-26 NOTE — Progress Notes (Signed)
Xolair injection documentation and charges entered by Maury Dus, RMA, based on injection sheet filled out by Alroy Bailiff per office protocol.   patient came in on 04/25/2017 to receive a Xolair injection. The patient is given 375mg  every 14 days. Due to each vial equalling 150mg , 75mg  of medication was wasted.

## 2017-04-27 ENCOUNTER — Ambulatory Visit (INDEPENDENT_AMBULATORY_CARE_PROVIDER_SITE_OTHER): Payer: Medicare PPO | Admitting: Internal Medicine

## 2017-04-27 ENCOUNTER — Encounter: Payer: Self-pay | Admitting: Internal Medicine

## 2017-04-27 VITALS — BP 110/70 | HR 82 | Ht 69.0 in | Wt 215.0 lb

## 2017-04-27 DIAGNOSIS — Z0000 Encounter for general adult medical examination without abnormal findings: Secondary | ICD-10-CM

## 2017-04-27 DIAGNOSIS — F39 Unspecified mood [affective] disorder: Secondary | ICD-10-CM

## 2017-04-27 DIAGNOSIS — J4550 Severe persistent asthma, uncomplicated: Secondary | ICD-10-CM | POA: Diagnosis not present

## 2017-04-27 DIAGNOSIS — Z8546 Personal history of malignant neoplasm of prostate: Secondary | ICD-10-CM | POA: Diagnosis not present

## 2017-04-27 DIAGNOSIS — M05742 Rheumatoid arthritis with rheumatoid factor of left hand without organ or systems involvement: Secondary | ICD-10-CM | POA: Diagnosis not present

## 2017-04-27 DIAGNOSIS — I10 Essential (primary) hypertension: Secondary | ICD-10-CM | POA: Diagnosis not present

## 2017-04-27 DIAGNOSIS — E222 Syndrome of inappropriate secretion of antidiuretic hormone: Secondary | ICD-10-CM

## 2017-04-27 DIAGNOSIS — M05741 Rheumatoid arthritis with rheumatoid factor of right hand without organ or systems involvement: Secondary | ICD-10-CM | POA: Diagnosis not present

## 2017-04-27 MED ORDER — FLUTICASONE PROPIONATE 50 MCG/ACT NA SUSP: 2.0000 | Freq: Two times a day (BID) | NASAL | 3 refills | Status: AC

## 2017-04-27 MED ORDER — FOLIC ACID 1 MG PO TABS: 1.0000 mg | ORAL_TABLET | Freq: Every day | ORAL | 3 refills | Status: DC

## 2017-04-27 NOTE — Assessment & Plan Note (Signed)
Requests recheck of PSA---?last one

## 2017-04-27 NOTE — Progress Notes (Signed)
Subjective:    Patient ID: [name], male    DOB: Jul 28, 1934, 83 y.o.   MRN: [id number]  HPI Here with wife for Medicare wellness visit and follow up of chronic health conditions Reviewed advanced directives Reviewed other doctors--Dr Halford Chessman (pulmonary), Dr Holley Raring (renal), Dr Jefm Bryant (rheumatology), Dr [name] (urology), Dr [name] (ortho), Dr Lanell Persons (dentist), Dr George Ina (ophtho), Dr Kellie Moor (derm) Recently hospitalized for UTI and worsened hyponatremia. Then went to rehab at [facility]. Home x 2 weeks Also hospitalized for pneumonia in January No tobacco No alcohol since hospitalization in December---rare now Not really exercising Slipped out of bed once--- no injury. No other falls. They lower their bed No depression or anhedonia Walks with walker Wife notes mild memory issues--mostly recall. Wife does bills Needs help with getting dressed (socks and underwear)--wife and daughter does instrumental ADLs  Ongoing hyponatremia Follows with Dr Holley Raring Back on salt tablets but off the furosemide (not having much edema) Neutral fluid status  Asthma has been quiet again No cough, wheezing or SOB since going back on the xolair  Chronic Foley catheter Follows with urologist regularly  Continues on MTX and plaquenil for his RA Most trouble with his right shoulder--much more than left. Unclear if this is RA Some back pain and right knee pain (better now)  Chronic anxiety Uses the alprazolam fairly regularly--wife has been limiting to be sure he doesn't take too many at night  Current Outpatient Prescriptions on File Prior to Visit  Medication Sig Dispense Refill  . albuterol (PROVENTIL HFA;VENTOLIN HFA) 108 (90 Base) MCG/ACT inhaler Inhale 2 puffs into the lungs every 6 (six) hours as needed for wheezing or shortness of breath. 1 Inhaler 3  . ALPRAZolam (XANAX) 0.5 MG tablet TAKE 1 TABLET BY MOUTH THREE TIMES DAILY AS NEEDED FOR ANXIETY 90 tablet 0  . aspirin 81 MG tablet Take 81  mg by mouth daily.    . Cholecalciferol (VITAMIN D3) 5000 UNITS CAPS Take 1 capsule by mouth daily.    . Coenzyme Q10 (CO Q 10) 100 MG CAPS Take 1 capsule by mouth daily.    . Cranberry 425 MG CAPS Take 2 capsules by mouth daily.    . fluticasone (FLONASE) 50 MCG/ACT nasal spray Place 2 sprays into both nostrils 2 (two) times daily.    Marland Kitchen gabapentin (NEURONTIN) 300 MG capsule TAKE 1 CAPSULE BY MOUTH THREE TIMES DAILY 90 capsule 11  . hydroxychloroquine (PLAQUENIL) 200 MG tablet Take 1 tablet by mouth 2 (two) times daily.    . methotrexate (RHEUMATREX) 2.5 MG tablet Take 10 tablets by mouth once a week.    . Milk Thistle 250 MG CAPS Take 1 capsule by mouth daily.    . Multiple Vitamin (MULTIVITAMIN) tablet Take 1 tablet by mouth daily.      Marland Kitchen omeprazole (PRILOSEC) 20 MG capsule TAKE 1 CAPSULE BY MOUTH EVERY DAY (Patient taking differently: TAKE 1 CAPSULE BY MOUTH AS NEEDED) 90 capsule 3  . polyethylene glycol (GLYCOLAX) packet Take 34 g by mouth daily.     . pravastatin (PRAVACHOL) 20 MG tablet TAKE 1 TABLET BY MOUTH EVERY DAY 90 tablet 1  . Probiotic Product (ALIGN) 4 MG CAPS Take 1 capsule (4 mg total) by mouth daily. 15 capsule 0  . Selenium 200 MCG CAPS Take 1 capsule by mouth daily.    . solifenacin (VESICARE) 5 MG tablet Take 5 mg by mouth at bedtime. For bladder spasms    . traMADol (ULTRAM) 50 MG tablet Take  1 tablet (50 mg total) by mouth 4 (four) times daily as needed for moderate pain. 12 tablet 0  . Turmeric 500 MG CAPS Take 500 mg by mouth daily.    . vitamin B-12 (CYANOCOBALAMIN) 500 MCG tablet Take 500 mcg by mouth daily.     Current Facility-Administered Medications on File Prior to Visit  Medication Dose Route Frequency Provider Last Rate Last Dose  . omalizumab Arvid Right) injection 375 mg  375 mg Subcutaneous Q14 Days Chesley Mires, MD   375 mg at 03/24/17 0857  . omalizumab Arvid Right) injection 375 mg  375 mg Subcutaneous Q14 Days Chesley Mires, MD   375 mg at 04/25/17 1132     Allergies  Allergen Reactions  . Ciprofloxacin Nausea And Vomiting    Headache  . Citalopram Hydrobromide Other (See Comments)    "unknown"  . Lorazepam     Adverse reaction  . Paroxetine Nausea Only  . Ramipril Other (See Comments)    "unknown"  . Simvastatin     Past Medical History:  Diagnosis Date  . Allergy   . Anemia   . Anxiety   . Arthritis    rheumatoid  . Asthma   . CAD (coronary artery disease)   . Cancer [facility])    Prostate  . Chronic sinusitis   . Depression   . Diverticulitis   . ED (erectile dysfunction)   . GERD (gastroesophageal reflux disease)   . History of SIADH   . Hx of adenomatous colonic polyps   . Hypertension   . Hyponatremia   . IBS (irritable bowel syndrome)   . Neurodermatitis   . Neurogenic bladder   . OSA (obstructive sleep apnea)     Past Surgical History:  Procedure Laterality Date  . KNEE ARTHROPLASTY Right 09/02/2015   Procedure: COMPUTER ASSISTED TOTAL KNEE ARTHROPLASTY;  Surgeon: [name], MD;  Location: [facility] ORS;  Service: Orthopedics;  Laterality: Right;  . KNEE SURGERY Left    ARTHROSCOPY LEFT  . NASAL SINUS SURGERY  2009   DEVIATED SEPTUM AND POLYPS  . PROSTATE SURGERY     PROSTATECTOMY  . TOTAL KNEE ARTHROPLASTY Left 9/15   Dr [name]  . URETHRAL STRICTURE DILATATION  02-2010   Dr.Cope    Family History  Problem Relation Age of Onset  . Heart disease Mother 89       heart failure  . Pneumonia Father 15       pnemonia  . Colon cancer Neg Hx     Social History   Social History  . Marital status: Married    Spouse name: N/A  . Number of children: 2  . Years of education: N/A   Occupational History  . Radio station owner     retired   Social History Main Topics  . Smoking status: Never Smoker  . Smokeless tobacco: Never Used  . Alcohol use 4.8 oz/week    8 Standard drinks or equivalent per week  . Drug use: No  . Sexual activity: Not on file   Other Topics Concern  . Not on file    Social History Narrative   Not sure about a living will or health care POA   Wife should make health care decisions for him--then daughter, then son   Would accept resuscitation attempts   Not sure about tube feeds   Review of Systems Appetite is fine Weight is about his normal now Sleep is variable--taking melatonin for this Bowels are okay. No blood No skin  ulcers--just easy bruising. Does have some raw areas on leg from wearing diapers (leakage from catheter) No heartburn or dysphagia    Objective:   Physical Exam  Constitutional: He is oriented to person, place, and time. He appears well-nourished. No distress.  HENT:  Mouth/Throat: Oropharynx is clear and moist. No oropharyngeal exudate.  Neck: No thyromegaly present.  Cardiovascular: Normal rate, regular rhythm, normal heart sounds and intact distal pulses.  Exam reveals no gallop.   No murmur heard. Pulmonary/Chest: Effort normal and breath sounds normal. No respiratory distress. He has no wheezes. He has no rales.  Musculoskeletal:  1+ ankle and foot edema  Lymphadenopathy:    He has no cervical adenopathy.  Neurological: He is alert and oriented to person, place, and time.  President--- "Daisy Floro, Quay Burow" [phone] [id number] Recall 3/3  Skin: No rash noted. No erythema.  Psychiatric: He has a normal mood and affect. His behavior is normal.          Assessment & Plan:

## 2017-04-27 NOTE — Assessment & Plan Note (Signed)
I have personally reviewed the Medicare Annual Wellness questionnaire and have noted 1. The patient's medical and social history 2. Their use of alcohol, tobacco or illicit drugs 3. Their current medications and supplements 4. The patient's functional ability including ADL's, fall risks, home safety risks and hearing or visual             impairment. 5. Diet and physical activities 6. Evidence for depression or mood disorders  The patients weight, height, BMI and visual acuity have been recorded in the chart I have made referrals, counseling and provided education to the patient based review of the above and I have provided the pt with a written personalized care plan for preventive services.  I have provided you with a copy of your personalized plan for preventive services. Please take the time to review along with your updated medication list.  Just had colonoscopy Will consider shingrix Discussed trying to exercise

## 2017-04-27 NOTE — Assessment & Plan Note (Signed)
Controlled again with the xolair

## 2017-04-27 NOTE — Assessment & Plan Note (Signed)
Seems to be controlled with the regimen Due to see his rheumatologist

## 2017-04-27 NOTE — Assessment & Plan Note (Signed)
Better with the salt Was prerenal in hospital so off furosemide Only mild edema in afternoon now--discussed checking with Dr Holley Raring if weight up or morning edema

## 2017-04-27 NOTE — Assessment & Plan Note (Signed)
BP Readings from Last 3 Encounters:  04/27/17 110/70  04/12/17 138/65  04/05/17 (!) 142/53   Good control

## 2017-04-27 NOTE — Assessment & Plan Note (Signed)
Chronic anxiety Trying to limit alprazolam

## 2017-04-28 LAB — LIPID PANEL
CHOL/HDL RATIO: 2
Cholesterol: 129 mg/dL (ref 0–200)
HDL: 52.8 mg/dL (ref >39.00)
LDL Cholesterol: 59 mg/dL (ref 0–99)
NONHDL: 75.89
Triglycerides: 86 mg/dL (ref 0.0–149.0)
VLDL: 17.2 mg/dL (ref 0.0–40.0)

## 2017-04-28 LAB — PSA: PSA: 0 ng/mL — ABNORMAL LOW (ref 0.10–4.00)

## 2017-05-01 ENCOUNTER — Telehealth: Payer: Self-pay | Admitting: Pulmonary Disease

## 2017-05-01 DIAGNOSIS — R339 Retention of urine, unspecified: Secondary | ICD-10-CM | POA: Diagnosis not present

## 2017-05-01 NOTE — Telephone Encounter (Signed)
#   vials:6 Ordered date:05/01/17 Shipping Date:05/01/17

## 2017-05-02 NOTE — Telephone Encounter (Signed)
#   Vials:6 Arrival Date:05/02/17 Lot #:[id number] Exp Date:10/21

## 2017-05-10 ENCOUNTER — Ambulatory Visit: Payer: Medicare PPO

## 2017-05-10 DIAGNOSIS — R339 Retention of urine, unspecified: Secondary | ICD-10-CM | POA: Diagnosis not present

## 2017-05-10 DIAGNOSIS — N319 Neuromuscular dysfunction of bladder, unspecified: Secondary | ICD-10-CM | POA: Diagnosis not present

## 2017-05-12 ENCOUNTER — Ambulatory Visit (INDEPENDENT_AMBULATORY_CARE_PROVIDER_SITE_OTHER): Payer: Medicare PPO

## 2017-05-12 DIAGNOSIS — J4520 Mild intermittent asthma, uncomplicated: Secondary | ICD-10-CM | POA: Diagnosis not present

## 2017-05-12 MED ORDER — OMALIZUMAB 150 MG ~~LOC~~ SOLR
375.0000 mg | SUBCUTANEOUS | Status: DC
  Administered 2017-05-12: 375 mg via SUBCUTANEOUS

## 2017-05-12 NOTE — Progress Notes (Signed)
Documentation of medication administration and charges of Xolair have been completed by Lindsay Lemons, CMA based on the Xolair documentation sheet completed by Tammy Scott.  

## 2017-05-26 DIAGNOSIS — N319 Neuromuscular dysfunction of bladder, unspecified: Secondary | ICD-10-CM | POA: Diagnosis not present

## 2017-05-29 ENCOUNTER — Other Ambulatory Visit: Payer: Self-pay | Admitting: Internal Medicine

## 2017-05-30 ENCOUNTER — Ambulatory Visit (INDEPENDENT_AMBULATORY_CARE_PROVIDER_SITE_OTHER): Payer: Medicare PPO

## 2017-05-30 ENCOUNTER — Ambulatory Visit: Payer: Medicare PPO

## 2017-05-30 DIAGNOSIS — J4520 Mild intermittent asthma, uncomplicated: Secondary | ICD-10-CM

## 2017-05-31 ENCOUNTER — Other Ambulatory Visit: Payer: Self-pay | Admitting: Internal Medicine

## 2017-06-01 ENCOUNTER — Other Ambulatory Visit: Payer: Self-pay | Admitting: Internal Medicine

## 2017-06-01 ENCOUNTER — Ambulatory Visit: Payer: Medicare PPO

## 2017-06-01 MED ORDER — OMALIZUMAB 150 MG ~~LOC~~ SOLR: 150.0000 mg | SUBCUTANEOUS | Status: DC

## 2017-06-01 MED ORDER — OMALIZUMAB 150 MG ~~LOC~~ SOLR
375.0000 mg | SUBCUTANEOUS | Status: DC
  Administered 2017-05-30: 375 mg via SUBCUTANEOUS

## 2017-06-01 NOTE — Telephone Encounter (Signed)
Approved: #[id number] x 0 

## 2017-06-01 NOTE — Progress Notes (Addendum)
Documentation of medication administration and charges of Xolair have been completed by Desmond Dike, CMA based on the [place] documentation sheet completed by [facility].   [name] came in on 05/30/2017 to receive a Xolair injection. The patient is given 375mg  every 14 days. Due to each vial equalling 150mg , 75mg  of medication was wasted.

## 2017-06-01 NOTE — Telephone Encounter (Signed)
Last filled 04-14-17 #90 Last OV 04-27-17 Next OV 10-27-17

## 2017-06-01 NOTE — Telephone Encounter (Signed)
Last filled 03-17-17 #120 Last OV 04-27-17 Next OV 10-27-17

## 2017-06-01 NOTE — Telephone Encounter (Signed)
Left refill on voice mail at pharmacy  

## 2017-06-01 NOTE — Telephone Encounter (Signed)
Approved: #[id number] x 0 

## 2017-06-06 DIAGNOSIS — Z79899 Other long term (current) drug therapy: Secondary | ICD-10-CM | POA: Diagnosis not present

## 2017-06-06 DIAGNOSIS — M0579 Rheumatoid arthritis with rheumatoid factor of multiple sites without organ or systems involvement: Secondary | ICD-10-CM | POA: Diagnosis not present

## 2017-06-07 ENCOUNTER — Telehealth: Payer: Self-pay | Admitting: Pulmonary Disease

## 2017-06-08 ENCOUNTER — Telehealth: Payer: Self-pay | Admitting: Pulmonary Disease

## 2017-06-08 NOTE — Telephone Encounter (Signed)
#   Vials:6  Arrival Date:06/08/17 Lot #:[id number] Exp Date:12/21

## 2017-06-08 NOTE — Telephone Encounter (Signed)
#   vials:6 Ordered date:06/07/17 Shipping Date:06/07/17

## 2017-06-08 NOTE — Telephone Encounter (Signed)
Created in error

## 2017-06-13 DIAGNOSIS — M150 Primary generalized (osteo)arthritis: Secondary | ICD-10-CM | POA: Diagnosis not present

## 2017-06-13 DIAGNOSIS — G8929 Other chronic pain: Secondary | ICD-10-CM | POA: Diagnosis not present

## 2017-06-13 DIAGNOSIS — M0579 Rheumatoid arthritis with rheumatoid factor of multiple sites without organ or systems involvement: Secondary | ICD-10-CM | POA: Diagnosis not present

## 2017-06-13 DIAGNOSIS — Z79899 Other long term (current) drug therapy: Secondary | ICD-10-CM | POA: Diagnosis not present

## 2017-06-13 DIAGNOSIS — M25511 Pain in right shoulder: Secondary | ICD-10-CM | POA: Diagnosis not present

## 2017-06-14 ENCOUNTER — Ambulatory Visit: Payer: Medicare PPO

## 2017-06-15 ENCOUNTER — Ambulatory Visit (INDEPENDENT_AMBULATORY_CARE_PROVIDER_SITE_OTHER): Payer: Medicare PPO

## 2017-06-15 DIAGNOSIS — J4520 Mild intermittent asthma, uncomplicated: Secondary | ICD-10-CM

## 2017-06-16 MED ORDER — OMALIZUMAB 150 MG ~~LOC~~ SOLR
375.0000 mg | SUBCUTANEOUS | Status: DC
  Administered 2017-06-16: 375 mg via SUBCUTANEOUS

## 2017-06-16 NOTE — Progress Notes (Signed)
Documentation of medication administration and charges of Xolair have been completed by Desmond Dike, CMA based on the [facility] documentation sheet completed by [facility].   [name] came in on 06/15/2017 to receive a Xolair injection. The patient is given 375mg  every 14 days. Due to each vial equalling 150mg , 75mg  of medication was wasted.

## 2017-06-23 ENCOUNTER — Telehealth: Payer: Self-pay

## 2017-06-23 NOTE — Telephone Encounter (Signed)
Pt was in office with his wife and said he never received lab results in May. Advised was on mychart; pt having problems with mychart and request copy of 03/2017 labs; copy of 04/27/17 labs. Given to pt and pt voiced understanding; nothing further needed.

## 2017-06-26 DIAGNOSIS — N319 Neuromuscular dysfunction of bladder, unspecified: Secondary | ICD-10-CM | POA: Diagnosis not present

## 2017-06-30 ENCOUNTER — Ambulatory Visit (INDEPENDENT_AMBULATORY_CARE_PROVIDER_SITE_OTHER): Payer: Medicare PPO

## 2017-06-30 DIAGNOSIS — J4550 Severe persistent asthma, uncomplicated: Secondary | ICD-10-CM | POA: Diagnosis not present

## 2017-07-04 ENCOUNTER — Telehealth: Payer: Self-pay | Admitting: Pulmonary Disease

## 2017-07-04 NOTE — Telephone Encounter (Signed)
#   vials:6 Ordered date:07/04/17 Shipping Date:07/04/17

## 2017-07-05 NOTE — Telephone Encounter (Signed)
#   Vials:6 Arrival Date:07/05/17 Lot #[id number] Exp Date:04/2020

## 2017-07-07 MED ORDER — OMALIZUMAB 150 MG ~~LOC~~ SOLR
375.0000 mg | Freq: Once | SUBCUTANEOUS | Status: AC
  Administered 2017-06-30: 375 mg via SUBCUTANEOUS

## 2017-07-10 DIAGNOSIS — M3501 Sicca syndrome with keratoconjunctivitis: Secondary | ICD-10-CM | POA: Diagnosis not present

## 2017-07-11 ENCOUNTER — Other Ambulatory Visit: Payer: Self-pay | Admitting: Internal Medicine

## 2017-07-17 ENCOUNTER — Ambulatory Visit: Payer: Medicare PPO

## 2017-07-18 ENCOUNTER — Ambulatory Visit (INDEPENDENT_AMBULATORY_CARE_PROVIDER_SITE_OTHER): Payer: Medicare PPO

## 2017-07-18 DIAGNOSIS — J4550 Severe persistent asthma, uncomplicated: Secondary | ICD-10-CM | POA: Diagnosis not present

## 2017-07-19 MED ORDER — OMALIZUMAB 150 MG ~~LOC~~ SOLR: 150.0000 mg | Freq: Once | SUBCUTANEOUS | Status: DC

## 2017-07-19 MED ORDER — OMALIZUMAB 150 MG ~~LOC~~ SOLR
375.0000 mg | Freq: Once | SUBCUTANEOUS | Status: AC
  Administered 2017-07-18: 375 mg via SUBCUTANEOUS

## 2017-07-19 NOTE — Progress Notes (Signed)
[name] came in on 8.21.18 to receive a Xolair injection. The patient is given 375mg  every 14 days. Due to each vial equalling 150mg , 15mg  of medication was wasted.

## 2017-07-20 ENCOUNTER — Other Ambulatory Visit: Payer: Self-pay

## 2017-07-20 NOTE — Telephone Encounter (Signed)
Pt left v/m requesting refill for xanax; last refilled # 90 on 06/01/17. [facility] [address] Pt last seen 04/27/17 for annual exam.

## 2017-07-21 DIAGNOSIS — N319 Neuromuscular dysfunction of bladder, unspecified: Secondary | ICD-10-CM | POA: Diagnosis not present

## 2017-07-21 MED ORDER — ALPRAZOLAM 0.5 MG PO TABS: 0.5000 mg | ORAL_TABLET | Freq: Three times a day (TID) | ORAL | 0 refills | Status: DC | PRN

## 2017-07-21 NOTE — Telephone Encounter (Signed)
Left refill on voice mail at pharmacy  

## 2017-07-21 NOTE — Telephone Encounter (Signed)
Approved: #[id number] x 0 

## 2017-07-26 ENCOUNTER — Encounter: Payer: Self-pay | Admitting: Internal Medicine

## 2017-07-26 ENCOUNTER — Ambulatory Visit (INDEPENDENT_AMBULATORY_CARE_PROVIDER_SITE_OTHER): Payer: Medicare PPO | Admitting: Internal Medicine

## 2017-07-26 VITALS — BP 118/70 | HR 74 | Temp 97.7°F | Wt 213.0 lb

## 2017-07-26 DIAGNOSIS — F39 Unspecified mood [affective] disorder: Secondary | ICD-10-CM | POA: Diagnosis not present

## 2017-07-26 NOTE — Progress Notes (Signed)
Subjective:    Patient ID: [name], male    DOB: November 08, 1934, 83 y.o.   MRN: [id number]  HPI Here due to ongoing sleep problems With daughter  Chronic problems with sleep Tries to initiate at midnight or 1AM Takes melatonin---still doesn't sleep well Will then fully awaken after 3-4 hours---will take 2 alprazolam then (and it works) If he tries the alprazolam at bedtime--he still awakens Has tried to cut out the melatonin--not clear there is any difference Up 10-11AM--but stays in bed (and may sleep a little more)  Awakens with headache at times Better after drinking some water Not groggy advill at bedtime will occasionally help--but not allowed to take due to CKD  Not generally tired during the day Will fall asleep in front of the TV in the evening  Has tramadol that he uses for pain Mostly will take 2 a day (takes 100mg  at a time)  Current Outpatient Prescriptions on File Prior to Visit  Medication Sig Dispense Refill  . albuterol (PROVENTIL HFA;VENTOLIN HFA) 108 (90 Base) MCG/ACT inhaler Inhale 2 puffs into the lungs every 6 (six) hours as needed for wheezing or shortness of breath. 1 Inhaler 3  . ALPRAZolam (XANAX) 0.5 MG tablet Take 1 tablet (0.5 mg total) by mouth 3 (three) times daily as needed. for anxiety 90 tablet 0  . aspirin 81 MG tablet Take 81 mg by mouth daily.    . Cholecalciferol (VITAMIN D3) 5000 UNITS CAPS Take 1 capsule by mouth daily.    . Coenzyme Q10 (CO Q 10) 100 MG CAPS Take 1 capsule by mouth daily.    . Cranberry 425 MG CAPS Take 2 capsules by mouth daily.    . fluticasone (FLONASE) 50 MCG/ACT nasal spray Place 2 sprays into both nostrils 2 (two) times daily. 48 g 3  . folic acid (FOLVITE) 1 MG tablet Take 1 tablet (1 mg total) by mouth daily. 90 tablet 3  . gabapentin (NEURONTIN) 300 MG capsule TAKE 1 CAPSULE BY MOUTH THREE TIMES DAILY 90 capsule 11  . hydroxychloroquine (PLAQUENIL) 200 MG tablet Take 1 tablet by mouth 2 (two) times daily.    .  methotrexate (RHEUMATREX) 2.5 MG tablet Take 10 tablets by mouth once a week.    . Milk Thistle 250 MG CAPS Take 1 capsule by mouth daily.    . Multiple Vitamin (MULTIVITAMIN) tablet Take 1 tablet by mouth daily.      Marland Kitchen omeprazole (PRILOSEC) 20 MG capsule TAKE 1 CAPSULE BY MOUTH EVERY DAY (Patient taking differently: TAKE 1 CAPSULE BY MOUTH AS NEEDED) 90 capsule 3  . polyethylene glycol (GLYCOLAX) packet Take 34 g by mouth daily.     . pravastatin (PRAVACHOL) 20 MG tablet TAKE 1 TABLET BY MOUTH EVERY DAY 90 tablet 3  . Probiotic Product (ALIGN) 4 MG CAPS Take 1 capsule (4 mg total) by mouth daily. 15 capsule 0  . Selenium 200 MCG CAPS Take 1 capsule by mouth daily.    . solifenacin (VESICARE) 5 MG tablet Take 5 mg by mouth at bedtime. For bladder spasms    . traMADol (ULTRAM) 50 MG tablet TAKE 1 TABLET BY MOUTH FOUR TIMES DAILY AS NEEDED 120 tablet 0  . Turmeric 500 MG CAPS Take 500 mg by mouth daily.    . vitamin B-12 (CYANOCOBALAMIN) 500 MCG tablet Take 500 mcg by mouth daily.     No current facility-administered medications on file prior to visit.     Allergies  Allergen Reactions  .  Ciprofloxacin Nausea And Vomiting    Headache  . Citalopram Hydrobromide Other (See Comments)    "unknown"  . Lorazepam     Adverse reaction  . Paroxetine Nausea Only  . Ramipril Other (See Comments)    "unknown"  . Simvastatin     Past Medical History:  Diagnosis Date  . Allergy   . Anemia   . Anxiety   . Arthritis    rheumatoid  . Asthma   . CAD (coronary artery disease)   . Cancer [facility])    Prostate  . Chronic sinusitis   . Depression   . Diverticulitis   . ED (erectile dysfunction)   . GERD (gastroesophageal reflux disease)   . History of SIADH   . Hx of adenomatous colonic polyps   . Hypertension   . Hyponatremia   . IBS (irritable bowel syndrome)   . Neurodermatitis   . Neurogenic bladder   . OSA (obstructive sleep apnea)     Past Surgical History:  Procedure Laterality  Date  . KNEE ARTHROPLASTY Right 09/02/2015   Procedure: COMPUTER ASSISTED TOTAL KNEE ARTHROPLASTY;  Surgeon: [name], MD;  Location: [facility] ORS;  Service: Orthopedics;  Laterality: Right;  . KNEE SURGERY Left    ARTHROSCOPY LEFT  . NASAL SINUS SURGERY  2009   DEVIATED SEPTUM AND POLYPS  . PROSTATE SURGERY     PROSTATECTOMY  . TOTAL KNEE ARTHROPLASTY Left 9/15   Dr [name]  . URETHRAL STRICTURE DILATATION  02-2010   Dr.Cope    Family History  Problem Relation Age of Onset  . Heart disease Mother 66       heart failure  . Pneumonia Father 23       pnemonia  . Colon cancer Neg Hx     Social History   Social History  . Marital status: Married    Spouse name: N/A  . Number of children: 2  . Years of education: N/A   Occupational History  . Radio station owner     retired   Social History Main Topics  . Smoking status: Never Smoker  . Smokeless tobacco: Never Used  . Alcohol use 4.8 oz/week    8 Standard drinks or equivalent per week  . Drug use: No  . Sexual activity: Not on file   Other Topics Concern  . Not on file   Social History Narrative   Not sure about a living will or health care POA   Wife should make health care decisions for him--then daughter, then son   Would accept resuscitation attempts   Not sure about tube feeds   Review of Systems Only uses the alprazolam during the day occasionally (but wife is now managing this for him) Takes excedrin in AM---thinks the caffeine may help Takes the gabapentin bid usually---1 at bedtime and the other in middle of the night (for neuropathy) Off the furosemide due to no edema lately Not depressed    Objective:   Physical Exam  Psychiatric: He has a normal mood and affect. His behavior is normal.          Assessment & Plan:

## 2017-07-26 NOTE — Patient Instructions (Signed)
Stop the melatonin. You may want to consider increasing the gabapentin-- take 2 at bedtime and another later as needed (and try not to take the extra alprazolam). Get out of bed in the morning--like by 10AM

## 2017-07-26 NOTE — Assessment & Plan Note (Addendum)
Chronic anxiety--but mostly uses the alprazolam for sleep  mostly gets nervous when he loses weight Has horrible sleep hygiene--discussed that he should have regular hours (instead of lounging in bed before and after sleep) Interested in visiting with Dr Cheryln Manly again--he was helpful. Will set up again Discussed decreasing medicines if possible

## 2017-08-01 ENCOUNTER — Other Ambulatory Visit: Payer: Self-pay | Admitting: Internal Medicine

## 2017-08-01 DIAGNOSIS — R339 Retention of urine, unspecified: Secondary | ICD-10-CM | POA: Diagnosis not present

## 2017-08-02 ENCOUNTER — Ambulatory Visit (INDEPENDENT_AMBULATORY_CARE_PROVIDER_SITE_OTHER): Payer: Medicare PPO

## 2017-08-02 DIAGNOSIS — J4540 Moderate persistent asthma, uncomplicated: Secondary | ICD-10-CM | POA: Diagnosis not present

## 2017-08-03 DIAGNOSIS — E871 Hypo-osmolality and hyponatremia: Secondary | ICD-10-CM | POA: Diagnosis not present

## 2017-08-03 MED ORDER — OMALIZUMAB 150 MG ~~LOC~~ SOLR
375.0000 mg | SUBCUTANEOUS | Status: DC
  Administered 2017-08-02: 375 mg via SUBCUTANEOUS

## 2017-08-03 NOTE — Progress Notes (Signed)
Xolair injection documentation and charges entered by Maury Dus, RMA, based on injection sheet filled out by Alroy Bailiff per office protocol.   patient came in on 08/02/17 to receive a Xolair injection. The patient is given 375mg  every 14 days. Due to each vial equalling 150mg , 75mg  of medication was wasted.

## 2017-08-04 DIAGNOSIS — M05761 Rheumatoid arthritis with rheumatoid factor of right knee without organ or systems involvement: Secondary | ICD-10-CM | POA: Diagnosis not present

## 2017-08-04 DIAGNOSIS — H40003 Preglaucoma, unspecified, bilateral: Secondary | ICD-10-CM | POA: Diagnosis not present

## 2017-08-04 DIAGNOSIS — Z79899 Other long term (current) drug therapy: Secondary | ICD-10-CM | POA: Diagnosis not present

## 2017-08-07 ENCOUNTER — Telehealth: Payer: Self-pay | Admitting: Pulmonary Disease

## 2017-08-07 DIAGNOSIS — E871 Hypo-osmolality and hyponatremia: Secondary | ICD-10-CM | POA: Diagnosis not present

## 2017-08-07 NOTE — Telephone Encounter (Signed)
#   vials:6 Ordered date:08/07/17 Shipping Date:08/07/17

## 2017-08-08 NOTE — Telephone Encounter (Signed)
#   Vials:6 Arrival Date:08/08/17 Lot #:[id number] Exp DVOU:03/1459

## 2017-08-10 ENCOUNTER — Other Ambulatory Visit: Payer: Self-pay

## 2017-08-10 MED ORDER — TRAMADOL HCL 50 MG PO TABS: 50.0000 mg | ORAL_TABLET | Freq: Four times a day (QID) | ORAL | 0 refills | Status: DC | PRN

## 2017-08-10 NOTE — Telephone Encounter (Signed)
Pt left v/m; pt thought tramadol had been requested from pharmacy couple of days ago. I do not see tramadol refill request so will send request to Dr Silvio Pate; last refilled # 120 on 06/01/17. Last seen 07/26/17.

## 2017-08-10 NOTE — Telephone Encounter (Signed)
Approved: #[id number] x 0 

## 2017-08-10 NOTE — Telephone Encounter (Signed)
Left refill on voice mail at pharmacy  

## 2017-08-16 DIAGNOSIS — N319 Neuromuscular dysfunction of bladder, unspecified: Secondary | ICD-10-CM | POA: Diagnosis not present

## 2017-08-16 DIAGNOSIS — N3281 Overactive bladder: Secondary | ICD-10-CM | POA: Diagnosis not present

## 2017-08-17 ENCOUNTER — Ambulatory Visit: Payer: Medicare PPO

## 2017-08-22 ENCOUNTER — Ambulatory Visit (INDEPENDENT_AMBULATORY_CARE_PROVIDER_SITE_OTHER): Payer: Medicare PPO

## 2017-08-22 DIAGNOSIS — J4540 Moderate persistent asthma, uncomplicated: Secondary | ICD-10-CM

## 2017-08-24 MED ORDER — OMALIZUMAB 150 MG ~~LOC~~ SOLR
375.0000 mg | SUBCUTANEOUS | Status: DC
  Administered 2017-08-22: 375 mg via SUBCUTANEOUS

## 2017-08-24 NOTE — Progress Notes (Signed)
Xolair injection documentation and charges entered by Maury Dus, RMA, based on injection sheet filled out by Alroy Bailiff per office protocol.   Patient came in on 08/22/2017 to receive a Xolair injection. The patient is given 375mg  every 14 days. Due to each vial equalling 150mg , 75mg  of medication was wasted.

## 2017-09-05 ENCOUNTER — Ambulatory Visit (INDEPENDENT_AMBULATORY_CARE_PROVIDER_SITE_OTHER): Payer: Medicare PPO

## 2017-09-05 DIAGNOSIS — Z23 Encounter for immunization: Secondary | ICD-10-CM

## 2017-09-06 ENCOUNTER — Ambulatory Visit: Payer: Medicare PPO

## 2017-09-07 ENCOUNTER — Other Ambulatory Visit: Payer: Self-pay | Admitting: Internal Medicine

## 2017-09-07 NOTE — Telephone Encounter (Signed)
Last filled 07-21-17 #90 Last OV 07-26-17 Next OV 10-27-17

## 2017-09-07 NOTE — Telephone Encounter (Signed)
Approved: #[id number] x 0 

## 2017-09-07 NOTE — Telephone Encounter (Signed)
Left refill on voice mail at pharmacy  

## 2017-09-08 ENCOUNTER — Ambulatory Visit (INDEPENDENT_AMBULATORY_CARE_PROVIDER_SITE_OTHER): Payer: Medicare PPO

## 2017-09-08 DIAGNOSIS — J4550 Severe persistent asthma, uncomplicated: Secondary | ICD-10-CM | POA: Diagnosis not present

## 2017-09-11 ENCOUNTER — Telehealth: Payer: Self-pay | Admitting: Pulmonary Disease

## 2017-09-11 NOTE — Telephone Encounter (Signed)
#   vials:6 Ordered date:09/11/17 Shipping Date:09/11/17

## 2017-09-12 NOTE — Telephone Encounter (Signed)
#   Vials: 6 Arrival Date:09/12/17 Lot #[id number] Exp Date:01/2021

## 2017-09-13 DIAGNOSIS — N319 Neuromuscular dysfunction of bladder, unspecified: Secondary | ICD-10-CM | POA: Diagnosis not present

## 2017-09-15 DIAGNOSIS — C4441 Basal cell carcinoma of skin of scalp and neck: Secondary | ICD-10-CM | POA: Diagnosis not present

## 2017-09-15 DIAGNOSIS — X32XXXA Exposure to sunlight, initial encounter: Secondary | ICD-10-CM | POA: Diagnosis not present

## 2017-09-15 DIAGNOSIS — D485 Neoplasm of uncertain behavior of skin: Secondary | ICD-10-CM | POA: Diagnosis not present

## 2017-09-15 DIAGNOSIS — L811 Chloasma: Secondary | ICD-10-CM | POA: Diagnosis not present

## 2017-09-15 DIAGNOSIS — L565 Disseminated superficial actinic porokeratosis (DSAP): Secondary | ICD-10-CM | POA: Diagnosis not present

## 2017-09-15 DIAGNOSIS — L570 Actinic keratosis: Secondary | ICD-10-CM | POA: Diagnosis not present

## 2017-09-17 ENCOUNTER — Other Ambulatory Visit: Payer: Self-pay | Admitting: Internal Medicine

## 2017-09-18 ENCOUNTER — Ambulatory Visit: Payer: Medicare PPO

## 2017-09-18 ENCOUNTER — Ambulatory Visit
Admission: RE | Admit: 2017-09-18 | Discharge: 2017-09-18 | Disposition: A | Discharge status: Home / Self Care | Payer: Medicare PPO | Source: Ambulatory Visit | Attending: Pulmonary Disease | Admitting: Pulmonary Disease

## 2017-09-18 DIAGNOSIS — M069 Rheumatoid arthritis, unspecified: Secondary | ICD-10-CM | POA: Diagnosis not present

## 2017-09-18 DIAGNOSIS — R911 Solitary pulmonary nodule: Secondary | ICD-10-CM | POA: Diagnosis not present

## 2017-09-18 DIAGNOSIS — I2510 Atherosclerotic heart disease of native coronary artery without angina pectoris: Secondary | ICD-10-CM | POA: Diagnosis not present

## 2017-09-18 DIAGNOSIS — I700 Atherosclerosis of aorta: Secondary | ICD-10-CM | POA: Insufficient documentation

## 2017-09-18 DIAGNOSIS — R9389 Abnormal findings on diagnostic imaging of other specified body structures: Secondary | ICD-10-CM | POA: Diagnosis not present

## 2017-09-18 DIAGNOSIS — J189 Pneumonia, unspecified organism: Secondary | ICD-10-CM | POA: Diagnosis not present

## 2017-09-18 DIAGNOSIS — R918 Other nonspecific abnormal finding of lung field: Secondary | ICD-10-CM | POA: Insufficient documentation

## 2017-09-18 NOTE — Telephone Encounter (Signed)
He does take this for his hyponatremia Okay to refill x 1 year--confirm he is taking it (but I am pretty sure)

## 2017-09-18 NOTE — Telephone Encounter (Signed)
Pt has requested a refill on his Lasix however I do not see this on his medication list.

## 2017-09-19 NOTE — Telephone Encounter (Signed)
Spoke to pt. He is taking it 3-4 times a week and sometimes everyday. Sent in the refill.

## 2017-09-21 ENCOUNTER — Telehealth: Payer: Self-pay | Admitting: Pulmonary Disease

## 2017-09-21 NOTE — Telephone Encounter (Signed)
CT chest 09/18/17 >> atherosclerosis, rounded ATX LLL, stable 4 mm RLL nodule, calcified granuloma in lingula, stable  4 mm RLL nodule   Please schedule ROV with me to review CT chest and status of his asthma.  Okay to double book visit.  Can let him know that CT chest findings are stable, and no progression in size of lung nodules.

## 2017-09-25 ENCOUNTER — Ambulatory Visit: Payer: Medicare PPO

## 2017-09-26 ENCOUNTER — Ambulatory Visit: Payer: Medicare PPO

## 2017-09-26 NOTE — Telephone Encounter (Signed)
Called and spoke with patient today regarding results per vs.  Informed the patient of his results today and recommendations per vs. The patient verbalized understanding and denied any questions or concerns at this time. Nothing further needed. 

## 2017-09-28 DIAGNOSIS — H40003 Preglaucoma, unspecified, bilateral: Secondary | ICD-10-CM | POA: Diagnosis not present

## 2017-09-29 ENCOUNTER — Ambulatory Visit (INDEPENDENT_AMBULATORY_CARE_PROVIDER_SITE_OTHER): Payer: Medicare PPO

## 2017-09-29 DIAGNOSIS — J4550 Severe persistent asthma, uncomplicated: Secondary | ICD-10-CM | POA: Diagnosis not present

## 2017-10-04 MED ORDER — OMALIZUMAB 150 MG ~~LOC~~ SOLR
375.0000 mg | Freq: Once | SUBCUTANEOUS | Status: AC
  Administered 2017-09-29: 375 mg via SUBCUTANEOUS

## 2017-10-04 NOTE — Progress Notes (Signed)
Mr. Zurn came in on 09/29/17 to receive a Xolair injection. The patient is given 375mg  every 14 days. Due to each vial equalling 150mg , 15mg  of medication was wasted.

## 2017-10-06 ENCOUNTER — Encounter: Payer: Self-pay | Admitting: Pulmonary Disease

## 2017-10-06 ENCOUNTER — Ambulatory Visit: Payer: Medicare PPO | Admitting: Pulmonary Disease

## 2017-10-06 VITALS — BP 120/76 | HR 77 | Ht 71.0 in | Wt 216.0 lb

## 2017-10-06 DIAGNOSIS — R911 Solitary pulmonary nodule: Secondary | ICD-10-CM

## 2017-10-06 DIAGNOSIS — J4550 Severe persistent asthma, uncomplicated: Secondary | ICD-10-CM | POA: Diagnosis not present

## 2017-10-06 NOTE — Progress Notes (Signed)
Current Outpatient Medications on File Prior to Visit  Medication Sig  . albuterol (PROVENTIL HFA;VENTOLIN HFA) 108 (90 Base) MCG/ACT inhaler Inhale 2 puffs into the lungs every 6 (six) hours as needed for wheezing or shortness of breath.  . ALPRAZolam (XANAX) 0.5 MG tablet TAKE 1 TABLET BY MOUTH THREE TIMES DAILY AS NEEDED FOR ANXIETY  . aspirin 81 MG tablet Take 81 mg by mouth daily.  . Cholecalciferol (VITAMIN D3) 5000 UNITS CAPS Take 1 capsule by mouth daily.  . Coenzyme Q10 (CO Q 10) 100 MG CAPS Take 1 capsule by mouth daily.  . Cranberry 425 MG CAPS Take 2 capsules by mouth daily.  . fluticasone (FLONASE) 50 MCG/ACT nasal spray Place 2 sprays into both nostrils 2 (two) times daily.  . folic acid (FOLVITE) 1 MG tablet Take 1 tablet (1 mg total) by mouth daily.  . furosemide (LASIX) 20 MG tablet TAKE 1 TABLET BY MOUTH EVERY DAY  . gabapentin (NEURONTIN) 300 MG capsule TAKE 1 CAPSULE BY MOUTH THREE TIMES DAILY  . gabapentin (NEURONTIN) 300 MG capsule TAKE 1 CAPSULE BY MOUTH THREE TIMES DAILY  . hydroxychloroquine (PLAQUENIL) 200 MG tablet Take 1 tablet by mouth 2 (two) times daily.  . methotrexate (RHEUMATREX) 2.5 MG tablet Take 10 tablets by mouth once a week.  . Milk Thistle 250 MG CAPS Take 1 capsule by mouth daily.  . Multiple Vitamin (MULTIVITAMIN) tablet Take 1 tablet by mouth daily.    Marland Kitchen omeprazole (PRILOSEC) 20 MG capsule TAKE 1 CAPSULE BY MOUTH EVERY DAY (Patient taking differently: TAKE 1 CAPSULE BY MOUTH AS NEEDED)  . polyethylene glycol (GLYCOLAX) packet Take 34 g by mouth daily.   . pravastatin (PRAVACHOL) 20 MG tablet TAKE 1 TABLET BY MOUTH EVERY DAY  . Probiotic Product (ALIGN) 4 MG CAPS Take 1 capsule (4 mg total) by mouth daily.  . Selenium 200 MCG CAPS Take 1 capsule by mouth daily.  . solifenacin (VESICARE) 5 MG tablet Take 5 mg by mouth at bedtime. For bladder spasms  . traMADol (ULTRAM) 50 MG tablet Take 1 tablet (50 mg total) by mouth 4 (four) times daily as needed.   . Turmeric 500 MG CAPS Take 500 mg by mouth daily.  . vitamin B-12 (CYANOCOBALAMIN) 500 MCG tablet Take 500 mcg by mouth daily.   Current Facility-Administered Medications on File Prior to Visit  Medication  . omalizumab Arvid Right) injection 375 mg     Chief Complaint  Patient presents with  . Follow-up    Pt is having issues of the insurance company not covering his Xolair shots. Pt is wanting to talk more about the CT scan today. Pt has dry cough on occassion for last 2 weeks with postnasal drip     Pulmonary tests PFT 04/07/10 >> FEV1 2.61 (86%), FEV1% 73, TLC 7.26 (85%), TLC 112% Ct chest 03/15/17 > atherosclerosis, 3.4 cm Lt lung base density rounded ATX, granuloma in lingula, 3 mm RLL nodule, 4 mm RLL nodule CT chest 09/18/17 >> atherosclerosis, rounded ATX LLL, stable 4 mm RLL nodule, calcified granuloma in lingula, stable  4 mm RLL nodule  Past medical history Rheumatoid arthritis, CAD, Neurogenic bladder, GERD, Prostate cancer, IBS, Anxiety, ED, HTN, Depression, SIADH, OSA >> resolved with weight loss  Past surgical history, Family history, Social history, Allergies reviewed  Vital Signs BP 120/76 (BP Location: Left Arm, Cuff Size: Normal)   Pulse 77   Ht 5\' 11"  (1.803 m)   Wt 216 lb (98 kg)   SpO2  96%   BMI 30.13 kg/m   History of Present Illness [name] is a 83 y.o. male with Allergic asthma.  He had CT chest in October.  This showed stable findings.  He is not having cough, wheeze, or chest congestion.  Main concern relates to shoulder pain and chronic foley catheter.  Physical Exam  General - pleasant Eyes - pupils reactive, wears glasses ENT - no sinus tenderness, no oral exudate, no LAN Cardiac - regular, no murmur Chest - no wheeze, rales Abd - soft, non tender Ext - no edema Skin - no rashes Neuro - normal strength Psych - normal mood   Assessment/Plan  Allergic asthma. - he has done remarkably well with xolair, and will continue this -  continue prn albuterol  Rounded atelectasis with lung nodules. - no additional radiographic follow up needed   Patient Instructions  Follow up in 6 months   Chesley Mires, MD [place] [facility] Pager:  [phone] 10/06/2017, 4:48 PM

## 2017-10-06 NOTE — Patient Instructions (Signed)
Follow up in 6 months 

## 2017-10-09 ENCOUNTER — Other Ambulatory Visit: Payer: Self-pay | Admitting: Internal Medicine

## 2017-10-09 NOTE — Telephone Encounter (Signed)
Last filled on 08/10/17 #120 tabs with 0 refills, f/u scheduled with provider on 10/27/17

## 2017-10-09 NOTE — Telephone Encounter (Signed)
Needs MD approval.

## 2017-10-09 NOTE — Telephone Encounter (Signed)
Copied from [place] [id number]. Topic: Quick Communication - See Telephone Encounter >> Oct 09, 2017  1:25 PM Burnis Medin, NT wrote: CRM for notification. See Telephone encounter for: Pt. Called and said that the pharmacy has faxed over the prescription for Tramadol two times and still hasn't heard back. Pt. Uses  [facility] on [address].  10/09/17.

## 2017-10-10 MED ORDER — TRAMADOL HCL 50 MG PO TABS: 50.0000 mg | ORAL_TABLET | Freq: Four times a day (QID) | ORAL | 0 refills | Status: DC | PRN

## 2017-10-10 NOTE — Telephone Encounter (Signed)
Left refill on voice mail at pharmacy  

## 2017-10-10 NOTE — Telephone Encounter (Signed)
Approved: #[id number] x 0 

## 2017-10-13 ENCOUNTER — Ambulatory Visit: Payer: Medicare PPO

## 2017-10-13 DIAGNOSIS — N319 Neuromuscular dysfunction of bladder, unspecified: Secondary | ICD-10-CM | POA: Diagnosis not present

## 2017-10-16 DIAGNOSIS — C4441 Basal cell carcinoma of skin of scalp and neck: Secondary | ICD-10-CM | POA: Diagnosis not present

## 2017-10-17 ENCOUNTER — Other Ambulatory Visit: Payer: Self-pay | Admitting: Internal Medicine

## 2017-10-18 ENCOUNTER — Encounter: Payer: Self-pay | Admitting: Emergency Medicine

## 2017-10-18 ENCOUNTER — Ambulatory Visit: Payer: Medicare PPO

## 2017-10-18 ENCOUNTER — Inpatient Hospital Stay
Admission: EM | Admit: 2017-10-18 | Discharge: 2017-10-22 | DRG: 698 | Disposition: A | Discharge status: Skilled Nursing Facility | Payer: Medicare PPO | Attending: Internal Medicine | Admitting: Internal Medicine

## 2017-10-18 DIAGNOSIS — N319 Neuromuscular dysfunction of bladder, unspecified: Secondary | ICD-10-CM | POA: Diagnosis not present

## 2017-10-18 DIAGNOSIS — N390 Urinary tract infection, site not specified: Secondary | ICD-10-CM

## 2017-10-18 DIAGNOSIS — J9601 Acute respiratory failure with hypoxia: Secondary | ICD-10-CM | POA: Diagnosis not present

## 2017-10-18 DIAGNOSIS — R278 Other lack of coordination: Secondary | ICD-10-CM | POA: Diagnosis not present

## 2017-10-18 DIAGNOSIS — R0902 Hypoxemia: Secondary | ICD-10-CM

## 2017-10-18 DIAGNOSIS — F329 Major depressive disorder, single episode, unspecified: Secondary | ICD-10-CM | POA: Diagnosis present

## 2017-10-18 DIAGNOSIS — T83511A Infection and inflammatory reaction due to indwelling urethral catheter, initial encounter: Principal | ICD-10-CM | POA: Diagnosis present

## 2017-10-18 DIAGNOSIS — E785 Hyperlipidemia, unspecified: Secondary | ICD-10-CM | POA: Diagnosis present

## 2017-10-18 DIAGNOSIS — J9811 Atelectasis: Secondary | ICD-10-CM | POA: Diagnosis not present

## 2017-10-18 DIAGNOSIS — Z7982 Long term (current) use of aspirin: Secondary | ICD-10-CM

## 2017-10-18 DIAGNOSIS — A419 Sepsis, unspecified organism: Secondary | ICD-10-CM | POA: Diagnosis not present

## 2017-10-18 DIAGNOSIS — F419 Anxiety disorder, unspecified: Secondary | ICD-10-CM | POA: Diagnosis present

## 2017-10-18 DIAGNOSIS — M6281 Muscle weakness (generalized): Secondary | ICD-10-CM | POA: Diagnosis not present

## 2017-10-18 DIAGNOSIS — I2510 Atherosclerotic heart disease of native coronary artery without angina pectoris: Secondary | ICD-10-CM | POA: Diagnosis present

## 2017-10-18 DIAGNOSIS — I4510 Unspecified right bundle-branch block: Secondary | ICD-10-CM | POA: Diagnosis not present

## 2017-10-18 DIAGNOSIS — Z8546 Personal history of malignant neoplasm of prostate: Secondary | ICD-10-CM

## 2017-10-18 DIAGNOSIS — E222 Syndrome of inappropriate secretion of antidiuretic hormone: Secondary | ICD-10-CM | POA: Diagnosis not present

## 2017-10-18 DIAGNOSIS — I10 Essential (primary) hypertension: Secondary | ICD-10-CM | POA: Diagnosis present

## 2017-10-18 DIAGNOSIS — J45909 Unspecified asthma, uncomplicated: Secondary | ICD-10-CM | POA: Diagnosis present

## 2017-10-18 DIAGNOSIS — Z79899 Other long term (current) drug therapy: Secondary | ICD-10-CM

## 2017-10-18 DIAGNOSIS — Y846 Urinary catheterization as the cause of abnormal reaction of the patient, or of later complication, without mention of misadventure at the time of the procedure: Secondary | ICD-10-CM | POA: Diagnosis present

## 2017-10-18 DIAGNOSIS — J9600 Acute respiratory failure, unspecified whether with hypoxia or hypercapnia: Secondary | ICD-10-CM | POA: Diagnosis not present

## 2017-10-18 DIAGNOSIS — G4733 Obstructive sleep apnea (adult) (pediatric): Secondary | ICD-10-CM | POA: Diagnosis present

## 2017-10-18 DIAGNOSIS — J90 Pleural effusion, not elsewhere classified: Secondary | ICD-10-CM | POA: Diagnosis not present

## 2017-10-18 DIAGNOSIS — E871 Hypo-osmolality and hyponatremia: Secondary | ICD-10-CM | POA: Diagnosis present

## 2017-10-18 DIAGNOSIS — K219 Gastro-esophageal reflux disease without esophagitis: Secondary | ICD-10-CM | POA: Diagnosis not present

## 2017-10-18 DIAGNOSIS — R2689 Other abnormalities of gait and mobility: Secondary | ICD-10-CM | POA: Diagnosis not present

## 2017-10-18 DIAGNOSIS — Z96652 Presence of left artificial knee joint: Secondary | ICD-10-CM | POA: Diagnosis present

## 2017-10-18 DIAGNOSIS — M069 Rheumatoid arthritis, unspecified: Secondary | ICD-10-CM | POA: Diagnosis not present

## 2017-10-18 DIAGNOSIS — F39 Unspecified mood [affective] disorder: Secondary | ICD-10-CM | POA: Diagnosis not present

## 2017-10-18 LAB — CBC
HCT: 36.1 % — ABNORMAL LOW (ref 40.0–52.0)
HEMOGLOBIN: 12.4 g/dL — AB (ref 13.0–18.0)
MCH: 33.9 pg (ref 26.0–34.0)
MCHC: 34.3 g/dL (ref 32.0–36.0)
MCV: 98.9 fL (ref 80.0–100.0)
Platelets: 163 10*3/uL (ref 150–440)
RBC: 3.66 MIL/uL — AB (ref 4.40–5.90)
RDW: 14 % (ref 11.5–14.5)
WBC: 15.8 10*3/uL — ABNORMAL HIGH (ref 3.8–10.6)

## 2017-10-18 LAB — BASIC METABOLIC PANEL
Anion gap: 10 (ref 5–15)
BUN: 20 mg/dL (ref 6–20)
CO2: 29 mmol/L (ref 22–32)
CREATININE: 1.26 mg/dL — AB (ref 0.61–1.24)
Calcium: 8.4 mg/dL — ABNORMAL LOW (ref 8.9–10.3)
Chloride: 95 mmol/L — ABNORMAL LOW (ref 101–111)
GFR calc Af Amer: 60 mL/min — ABNORMAL LOW (ref >60)
GFR calc non Af Amer: 52 mL/min — ABNORMAL LOW (ref >60)
Glucose, Bld: 118 mg/dL — ABNORMAL HIGH (ref 65–99)
Potassium: 4.5 mmol/L (ref 3.5–5.1)
SODIUM: 134 mmol/L — AB (ref 135–145)

## 2017-10-18 LAB — URINALYSIS, COMPLETE (UACMP) WITH MICROSCOPIC
BACTERIA UA: NONE SEEN
Bilirubin Urine: NEGATIVE
GLUCOSE, UA: NEGATIVE mg/dL
KETONES UR: 5 mg/dL — AB
NITRITE: POSITIVE — AB
PROTEIN: 100 mg/dL — AB
Specific Gravity, Urine: 1.019 (ref 1.005–1.030)
pH: 5 (ref 5.0–8.0)

## 2017-10-18 LAB — LACTIC ACID, PLASMA: LACTIC ACID, VENOUS: 1.5 mmol/L (ref 0.5–1.9)

## 2017-10-18 MED ORDER — DARIFENACIN HYDROBROMIDE ER 7.5 MG PO TB24
7.5000 mg | ORAL_TABLET | Freq: Every day | ORAL | Status: DC
  Administered 2017-10-19 – 2017-10-22 (×4): 7.5 mg via ORAL
  Filled 2017-10-18 (×4): qty 1

## 2017-10-18 MED ORDER — ONDANSETRON HCL 4 MG/2ML IJ SOLN: 4.0000 mg | Freq: Four times a day (QID) | INTRAMUSCULAR | Status: DC | PRN

## 2017-10-18 MED ORDER — ADULT MULTIVITAMIN W/MINERALS CH
1.0000 | ORAL_TABLET | Freq: Every day | ORAL | Status: DC
  Administered 2017-10-19: 1 via ORAL
  Filled 2017-10-18 (×3): qty 1

## 2017-10-18 MED ORDER — SODIUM CHLORIDE 0.9 % IV SOLN
INTRAVENOUS | Status: DC
  Administered 2017-10-18: 23:00:00 via INTRAVENOUS

## 2017-10-18 MED ORDER — SODIUM CHLORIDE 0.9 % IV BOLUS (SEPSIS)
1000.0000 mL | Freq: Once | INTRAVENOUS | Status: AC
  Administered 2017-10-18: 1000 mL via INTRAVENOUS

## 2017-10-18 MED ORDER — PRAVASTATIN SODIUM 20 MG PO TABS
20.0000 mg | ORAL_TABLET | Freq: Every day | ORAL | Status: DC
  Administered 2017-10-19 – 2017-10-21 (×3): 20 mg via ORAL
  Filled 2017-10-18 (×3): qty 1

## 2017-10-18 MED ORDER — DEXTROSE 5 % IV SOLN: 1.0000 g | INTRAVENOUS | Status: DC

## 2017-10-18 MED ORDER — LIDOCAINE HCL 2 % EX GEL
1.0000 "application " | Freq: Once | CUTANEOUS | Status: AC
  Administered 2017-10-18: 1 via URETHRAL

## 2017-10-18 MED ORDER — FUROSEMIDE 20 MG PO TABS: 20.0000 mg | ORAL_TABLET | Freq: Every day | ORAL | Status: DC

## 2017-10-18 MED ORDER — SELENIUM 200 MCG PO CAPS: 1.0000 | ORAL_CAPSULE | Freq: Every day | ORAL | Status: DC

## 2017-10-18 MED ORDER — ACETAMINOPHEN 325 MG PO TABS
ORAL_TABLET | ORAL | Status: AC
  Administered 2017-10-18: 650 mg via ORAL
  Filled 2017-10-18: qty 2

## 2017-10-18 MED ORDER — LIDOCAINE HCL 2 % EX GEL
CUTANEOUS | Status: AC
  Filled 2017-10-18: qty 10

## 2017-10-18 MED ORDER — HYDROCODONE-ACETAMINOPHEN 5-325 MG PO TABS: 1.0000 | ORAL_TABLET | ORAL | Status: DC | PRN

## 2017-10-18 MED ORDER — ALPRAZOLAM 0.5 MG PO TABS
0.5000 mg | ORAL_TABLET | Freq: Three times a day (TID) | ORAL | Status: DC | PRN
  Administered 2017-10-18 – 2017-10-21 (×4): 0.5 mg via ORAL
  Filled 2017-10-18 (×4): qty 1

## 2017-10-18 MED ORDER — ACETAMINOPHEN 650 MG RE SUPP: 650.0000 mg | Freq: Four times a day (QID) | RECTAL | Status: DC | PRN

## 2017-10-18 MED ORDER — GABAPENTIN 300 MG PO CAPS
300.0000 mg | ORAL_CAPSULE | Freq: Three times a day (TID) | ORAL | Status: DC
  Administered 2017-10-18 – 2017-10-22 (×12): 300 mg via ORAL
  Filled 2017-10-18 (×12): qty 1

## 2017-10-18 MED ORDER — CYANOCOBALAMIN 500 MCG PO TABS
500.0000 ug | ORAL_TABLET | Freq: Every day | ORAL | Status: DC
  Filled 2017-10-18 (×4): qty 1

## 2017-10-18 MED ORDER — TURMERIC 500 MG PO CAPS: 500.0000 mg | ORAL_CAPSULE | Freq: Every day | ORAL | Status: DC

## 2017-10-18 MED ORDER — VITAMIN D 1000 UNITS PO TABS
5000.0000 [IU] | ORAL_TABLET | Freq: Every day | ORAL | Status: DC
  Administered 2017-10-19: 5000 [IU] via ORAL
  Filled 2017-10-18 (×3): qty 5

## 2017-10-18 MED ORDER — ASPIRIN EC 81 MG PO TBEC
81.0000 mg | DELAYED_RELEASE_TABLET | Freq: Every day | ORAL | Status: DC
  Administered 2017-10-19 – 2017-10-22 (×4): 81 mg via ORAL
  Filled 2017-10-18 (×5): qty 1

## 2017-10-18 MED ORDER — ONDANSETRON HCL 4 MG PO TABS: 4.0000 mg | ORAL_TABLET | Freq: Four times a day (QID) | ORAL | Status: DC | PRN

## 2017-10-18 MED ORDER — ENOXAPARIN SODIUM 40 MG/0.4ML ~~LOC~~ SOLN
40.0000 mg | SUBCUTANEOUS | Status: DC
  Administered 2017-10-18 – 2017-10-21 (×4): 40 mg via SUBCUTANEOUS
  Filled 2017-10-18 (×4): qty 0.4

## 2017-10-18 MED ORDER — DEXTROSE 5 % IV SOLN
2.0000 g | INTRAVENOUS | Status: DC
  Administered 2017-10-19 – 2017-10-22 (×4): 2 g via INTRAVENOUS
  Filled 2017-10-18 (×4): qty 2

## 2017-10-18 MED ORDER — CO Q 10 100 MG PO CAPS: 1.0000 | ORAL_CAPSULE | Freq: Every day | ORAL | Status: DC

## 2017-10-18 MED ORDER — POLYETHYLENE GLYCOL 3350 17 G PO PACK: 17.0000 g | PACK | Freq: Every day | ORAL | Status: DC | PRN

## 2017-10-18 MED ORDER — CEFTRIAXONE SODIUM IN DEXTROSE 20 MG/ML IV SOLN
1.0000 g | Freq: Once | INTRAVENOUS | Status: AC
  Administered 2017-10-18: 1 g via INTRAVENOUS
  Filled 2017-10-18: qty 50

## 2017-10-18 MED ORDER — ALBUTEROL SULFATE (2.5 MG/3ML) 0.083% IN NEBU: 2.5000 mg | INHALATION_SOLUTION | Freq: Four times a day (QID) | RESPIRATORY_TRACT | Status: DC | PRN

## 2017-10-18 MED ORDER — ACETAMINOPHEN 325 MG PO TABS
650.0000 mg | ORAL_TABLET | Freq: Four times a day (QID) | ORAL | Status: DC | PRN
  Administered 2017-10-18 – 2017-10-22 (×3): 650 mg via ORAL
  Filled 2017-10-18 (×2): qty 2

## 2017-10-18 MED ORDER — RISAQUAD PO CAPS
1.0000 | ORAL_CAPSULE | Freq: Every day | ORAL | Status: DC
  Administered 2017-10-19 – 2017-10-22 (×4): 1 via ORAL
  Filled 2017-10-18 (×4): qty 1

## 2017-10-18 MED ORDER — FOLIC ACID 1 MG PO TABS
1.0000 mg | ORAL_TABLET | Freq: Every day | ORAL | Status: DC
  Administered 2017-10-19 – 2017-10-22 (×4): 1 mg via ORAL
  Filled 2017-10-18 (×4): qty 1

## 2017-10-18 MED ORDER — HYDROXYCHLOROQUINE SULFATE 200 MG PO TABS
200.0000 mg | ORAL_TABLET | Freq: Two times a day (BID) | ORAL | Status: DC
  Administered 2017-10-18 – 2017-10-22 (×8): 200 mg via ORAL
  Filled 2017-10-18 (×9): qty 1

## 2017-10-18 MED ORDER — DEXTROSE 5 % IV SOLN
1.0000 g | Freq: Once | INTRAVENOUS | Status: AC
  Administered 2017-10-18: 1 g via INTRAVENOUS
  Filled 2017-10-18: qty 10

## 2017-10-18 MED ORDER — FLUTICASONE PROPIONATE 50 MCG/ACT NA SUSP: 2.0000 | Freq: Two times a day (BID) | NASAL | Status: DC

## 2017-10-18 MED ORDER — POLYETHYLENE GLYCOL 3350 17 G PO PACK
34.0000 g | PACK | Freq: Every day | ORAL | Status: DC
  Filled 2017-10-18: qty 2

## 2017-10-18 NOTE — ED Notes (Addendum)
Pt's catheter from home removed Pt states he gets his catheter replaced monthly, catheter last changed on Friday 11/16

## 2017-10-18 NOTE — Telephone Encounter (Signed)
Filled in Dr. Alla German absence.

## 2017-10-18 NOTE — ED Notes (Signed)
Lab at bedside getting blood work

## 2017-10-18 NOTE — Telephone Encounter (Signed)
Rx called in to requested pharmacy 

## 2017-10-18 NOTE — Telephone Encounter (Signed)
Last Rx 09/07/2017. Last OV 08//2018

## 2017-10-18 NOTE — ED Notes (Addendum)
Pt's leg bag changed for urine specimen and pt given water

## 2017-10-18 NOTE — ED Notes (Addendum)
Pt placed on 2L of O2 due to pt's oxygen 86% on RA. Pt sleeping, with mouth open, family denies pt wearing oxygen at home. Pt's O2 94% on 2L, EDP made aware.

## 2017-10-18 NOTE — Progress Notes (Signed)
Pharmacy Antibiotic Note  [name] is a 83 y.o. male admitted on 10/18/2017 with sepsis s/t UTI d/t indwelling foley catheter.  Pharmacy has been consulted for ceftriaxone dosing.  Plan: Patient received ceftriaxone 1g IV x 1  Will give another stat ceftriaxone 1g IV x 1 now for a total of 2g for today. Will start ceftriaxone 2g IV daily for another 6 days for a total of 7 days.  Height: 5\' 11"  (180.3 cm) Weight: 220 lb 9.6 oz (100.1 kg) IBW/kg (Calculated) : 75.3  Temp (24hrs), Avg:100.3 F (37.9 C), Min:98.2 F (36.8 C), Max:102.6 F (39.2 C)  Recent Labs  Lab 10/18/17 1448  WBC 15.8*  CREATININE 1.26*  LATICACIDVEN 1.5    Estimated Creatinine Clearance: 55.4 mL/min (A) (by C-G formula based on SCr of 1.26 mg/dL (H)).    Allergies  Allergen Reactions  . Ciprofloxacin Nausea And Vomiting    Headache  . Citalopram Hydrobromide Other (See Comments)    "unknown"  . Lorazepam     Adverse reaction  . Paroxetine Nausea Only  . Ramipril Other (See Comments)    "unknown"  . Simvastatin     Thank you for allowing pharmacy to be a part of this patient's care.  Tobie Lords, PharmD, BCPS Clinical Pharmacist 10/18/2017

## 2017-10-18 NOTE — Progress Notes (Signed)
CODE SEPSIS - PHARMACY COMMUNICATION  **Broad Spectrum Antibiotics should be administered within 1 hour of Sepsis diagnosis**  Time Code Sepsis Called/Page Received: 1538  Antibiotics Ordered: Ceftriaxone  Time of 1st antibiotic administration: 1559  Additional action taken by pharmacy: None  If necessary, Name of Provider/Nurse Contacted: N/A   Lendon Ka, PharmD Pharmacy Resident 10/18/2017  4:08 PM

## 2017-10-18 NOTE — ED Notes (Signed)
Pt placed on 2L March ARB , o2 sats 888-91% on RA

## 2017-10-18 NOTE — ED Provider Notes (Signed)
[facility] Emergency Department Provider Note  ____________________________________________  Time seen: Approximately 2:55 PM  I have reviewed the triage vital signs and the nursing notes.   HISTORY  Chief Complaint Weakness   HPI [name] is a 83 y.o. male with a history of neurogenic bladder with indwelling Foley catheter and recurrent UTIs, asthma, hyponatremia, hypertension who presents for evaluation of fever and generalized weakness. MAXIMUM TEMPERATURE of 100.87F last night. Patient today unable to walk due to generalized weakness and fatigue. Has had chills. Symptoms are similar to prior UTIs. Foley was last changed 5 days ago. No abdominal pain, no chest pain, no cough, no shortness of breath, no hematuria, no diarrhea, no nausea or vomiting. Per daughter patient is also slightly confused since this morning.  Past Medical History:  Diagnosis Date  . Allergy   . Anemia   . Anxiety   . Arthritis    rheumatoid  . Asthma   . CAD (coronary artery disease)   . Cancer [facility])    Prostate  . Chronic sinusitis   . Depression   . Diverticulitis   . ED (erectile dysfunction)   . GERD (gastroesophageal reflux disease)   . History of SIADH   . Hx of adenomatous colonic polyps   . Hypertension   . Hyponatremia   . IBS (irritable bowel syndrome)   . Neurodermatitis   . Neurogenic bladder   . OSA (obstructive sleep apnea)     Patient Active Problem List   Diagnosis Date Noted  . SIADH (syndrome of inappropriate ADH production) ([place]) 04/17/2017  . Hyponatremia 01/16/2017  . Asthma with acute exacerbation 11/14/2016  . Mood disorder ([place]) 01/18/2016  . Presence of indwelling urinary catheter 08/20/2015  . Advanced directives, counseling/discussion 07/28/2014  . Routine general medical examination at a health care facility 04/09/2012  . History of prostate cancer 04/09/2012  . Venous stasis dermatitis 06/13/2011  . Chronic sinusitis   .  CONSTIPATION, CHRONIC 12/07/2010  . Hyperlipemia 12/02/2010  . OSTEOARTHRITIS 12/02/2010  . NEUROGENIC BLADDER 05/24/2010  . IBS 05/05/2010  . PERSONAL HISTORY OF COLONIC POLYPS 05/05/2010  . NEUROPATHY 05/03/2010  . Essential hypertension, benign 03/25/2009  . GAD (generalized anxiety disorder) 01/27/2007  . Coronary atherosclerosis of native coronary artery 01/27/2007  . Allergic asthma 01/27/2007  . GERD 01/27/2007  . Chronic rheumatic arthritis ([place]) 01/27/2007    Past Surgical History:  Procedure Laterality Date  . KNEE ARTHROPLASTY Right 09/02/2015   Procedure: COMPUTER ASSISTED TOTAL KNEE ARTHROPLASTY;  Surgeon: [name], MD;  Location: [facility] ORS;  Service: Orthopedics;  Laterality: Right;  . KNEE SURGERY Left    ARTHROSCOPY LEFT  . NASAL SINUS SURGERY  2009   DEVIATED SEPTUM AND POLYPS  . PROSTATE SURGERY     PROSTATECTOMY  . TOTAL KNEE ARTHROPLASTY Left 9/15   Dr [name]  . URETHRAL STRICTURE DILATATION  02-2010   Dr.Cope    Prior to Admission medications   Medication Sig Start Date End Date Taking? Authorizing Provider  ALPRAZolam Duanne Moron) 0.5 MG tablet TAKE 1 TABLET BY MOUTH THREE TIMES DAILY AS NEEDED FOR ANXIETY 10/18/17  Yes Elby Beck, FNP  aspirin 81 MG tablet Take 81 mg by mouth daily.   Yes [provider]  Cholecalciferol (VITAMIN D3) 5000 UNITS CAPS Take 1 capsule by mouth daily.   Yes [provider]  Coenzyme Q10 (CO Q 10) 100 MG CAPS Take 1 capsule by mouth daily.   Yes [provider]  Cranberry 425 MG CAPS Take 2 capsules by mouth daily.   Yes [provider]  folic acid (FOLVITE) 1 MG tablet Take 1 tablet (1 mg total) by mouth daily. 04/27/17  Yes [name], MD  gabapentin (NEURONTIN) 300 MG capsule TAKE 1 CAPSULE BY MOUTH THREE TIMES DAILY 07/12/16  Yes [name], MD  hydroxychloroquine (PLAQUENIL) 200 MG tablet Take 1 tablet by mouth 2 (two) times daily. 01/22/16  Yes [provider]   methotrexate (RHEUMATREX) 2.5 MG tablet Take 10 tablets by mouth once a week. 01/07/16  Yes [provider]  Milk Thistle 250 MG CAPS Take 1 capsule by mouth daily.   Yes [provider]  Multiple Vitamin (MULTIVITAMIN) tablet Take 1 tablet by mouth daily.     Yes [provider]  omeprazole (PRILOSEC) 20 MG capsule TAKE 1 CAPSULE BY MOUTH EVERY DAY Patient taking differently: TAKE 1 CAPSULE BY MOUTH AS NEEDED 03/24/16  Yes Viviana Simpler I, MD  pravastatin (PRAVACHOL) 20 MG tablet TAKE 1 TABLET BY MOUTH EVERY DAY 05/29/17  Yes [name], MD  Probiotic Product (ALIGN) 4 MG CAPS Take 1 capsule (4 mg total) by mouth daily. 12/15/16  Yes Gouru, Illene Silver, MD  Selenium 200 MCG CAPS Take 1 capsule by mouth daily.   Yes [provider]  solifenacin (VESICARE) 5 MG tablet Take 5 mg by mouth at bedtime. For bladder spasms   Yes [provider]  traMADol (ULTRAM) 50 MG tablet Take 1 tablet (50 mg total) 4 (four) times daily as needed by mouth. 10/10/17  Yes [name], MD  Turmeric 500 MG CAPS Take 500 mg by mouth daily.   Yes [provider]  vitamin B-12 (CYANOCOBALAMIN) 500 MCG tablet Take 500 mcg by mouth daily.   Yes [provider]  albuterol (PROVENTIL HFA;VENTOLIN HFA) 108 (90 Base) MCG/ACT inhaler Inhale 2 puffs into the lungs every 6 (six) hours as needed for wheezing or shortness of breath. 11/14/16   [name], MD  fluticasone (FLONASE) 50 MCG/ACT nasal spray Place 2 sprays into both nostrils 2 (two) times daily. 04/27/17   [name], MD  furosemide (LASIX) 20 MG tablet TAKE 1 TABLET BY MOUTH EVERY DAY Patient taking differently: TAKE 1 TABLET BY mouth 3 times a week 09/19/17   [name], MD  polyethylene glycol [facility]) packet Take 34 g by mouth daily.     [provider]    Allergies Ciprofloxacin; Citalopram hydrobromide; Lorazepam; Paroxetine; Ramipril; and Simvastatin  Family History   Problem Relation Age of Onset  . Heart disease Mother 90       heart failure  . Pneumonia Father 30       pnemonia  . Colon cancer Neg Hx     Social History Social History   Tobacco Use  . Smoking status: Never Smoker  . Smokeless tobacco: Never Used  Substance Use Topics  . Alcohol use: Yes    Alcohol/week: 4.8 oz    Types: 8 Standard drinks or equivalent per week  . Drug use: No    Review of Systems  Constitutional: + fever, generalized weakness and fatigue Eyes: Negative for visual changes. ENT: Negative for sore throat. Neck: No neck pain  Cardiovascular: Negative for chest pain. Respiratory: Negative for shortness of breath. Gastrointestinal: Negative for abdominal pain, vomiting or diarrhea. Genitourinary: Negative for dysuria. Musculoskeletal: Negative for back pain. Skin: Negative for rash. Neurological: Negative for headaches, weakness or numbness. Psych:  No SI or HI  ____________________________________________   PHYSICAL EXAM:  VITAL SIGNS: ED Triage Vitals  Enc Vitals Group     BP 10/18/17 1401 (!) 77/31     Pulse Rate 10/18/17 1401 90     Resp 10/18/17 1401 20     Temp 10/18/17 1401 99.3 F (37.4 C)     Temp Source 10/18/17 1401 Oral     SpO2 10/18/17 1401 92 %     Weight 10/18/17 1359 216 lb (98 kg)     Height 10/18/17 1359 5\' 11"  (1.803 m)     Head Circumference --      Peak Flow --      Pain Score 10/18/17 1358 7     Pain Loc --      Pain Edu? --      Excl. in [place]? --     Constitutional: Alert and oriented. Well appearing and in no apparent distress. HEENT:      Head: Normocephalic and atraumatic.         Eyes: Conjunctivae are normal. Sclera is non-icteric.       Mouth/Throat: Mucous membranes are moist.       Neck: Supple with no signs of meningismus. Cardiovascular: Regular rate and rhythm. No murmurs, gallops, or rubs. 2+ symmetrical distal pulses are present in all extremities. No JVD. Respiratory: Normal respiratory effort.  Lungs are clear to auscultation bilaterally. No wheezes, crackles, or rhonchi.  Gastrointestinal: Soft, non tender, and non distended with positive bowel sounds. No rebound or guarding. Genitourinary: No CVA tenderness. Foley draining clear yellow urine Musculoskeletal: Nontender with normal range of motion in all extremities. No edema, cyanosis, or erythema of extremities. Neurologic: Normal speech and language. Face is symmetric. Moving all extremities. No gross focal neurologic deficits are appreciated. Skin: Skin is warm, dry and intact. No rash noted. Psychiatric: Mood and affect are normal. Speech and behavior are normal.  ____________________________________________   LABS (all labs ordered are listed, but only abnormal results are displayed)  Labs Reviewed  CBC - Abnormal; Notable for the following components:      Result Value   WBC 15.8 (*)    RBC 3.66 (*)    Hemoglobin 12.4 (*)    HCT 36.1 (*)    All other components within normal limits  URINE CULTURE  CULTURE, BLOOD (ROUTINE X 2)  CULTURE, BLOOD (ROUTINE X 2)  BASIC METABOLIC PANEL  URINALYSIS, COMPLETE (UACMP) WITH MICROSCOPIC  LACTIC ACID, PLASMA   ____________________________________________  EKG  ED ECG REPORT I, Rudene Re, the attending physician, personally viewed and interpreted this ECG.  Normal sinus rhythm, rate of 91, right bundle branch block, normal QTC, right axis deviation, no ST elevations or depressions. Unchanged from prior from May 2008 ____________________________________________  RADIOLOGY  none  ____________________________________________   PROCEDURES  Procedure(s) performed: None Procedures Critical Care performed:  Yes  CRITICAL CARE Performed by: Rudene Re  ?  Total critical care time: 35 min  Critical care time was exclusive of separately billable procedures and treating other patients.  Critical care was necessary to treat or prevent imminent or  life-threatening deterioration.  Critical care was time spent personally by me on the following activities: development of treatment plan with patient and/or surrogate as well as nursing, discussions with consultants, evaluation of patient's response to treatment, examination of patient, obtaining history from patient or surrogate, ordering and performing treatments and interventions, ordering and review of laboratory studies, ordering and review of radiographic studies, pulse oximetry and  re-evaluation of patient's condition.  ____________________________________________   INITIAL IMPRESSION / ASSESSMENT AND PLAN / ED COURSE  83 y.o. male with a history of neurogenic bladder with indwelling Foley catheter and recurrent UTIs, asthma, hyponatremia, hypertension who presents for evaluation of fever and generalized weakness. Patient hypotensive with fever at home, leukocytosis, and an indwelling Foley catheter meeting sepsis criteria. Review of Epic shows prior Ucx all sensitive to Rocephin which has been ordered. Lactic, cultures, and UA pending. Care transferred to Dr. Cherylann Banas.       As part of my medical decision making, I reviewed the following data within the [place] notes reviewed and incorporated, Labs reviewed , EKG interpreted , Old EKG reviewed, Old chart reviewed, Patient signed out to Dr. Cherylann Banas, Notes from prior ED visits and [place] Controlled Substance Database    Pertinent labs & imaging results that were available during my care of the patient were reviewed by me and considered in my medical decision making (see chart for details).    ____________________________________________   FINAL CLINICAL IMPRESSION(S) / ED DIAGNOSES  Final diagnoses:  Sepsis, due to unspecified organism [facility])      NEW MEDICATIONS STARTED DURING THIS VISIT:  ED Discharge Orders    None       Note:  This document was prepared using Dragon voice recognition  software and may include unintentional dictation errors.    Rudene Re, MD 10/18/17 [phone]

## 2017-10-18 NOTE — H&P (Signed)
[place] [facility] [place] NAME: [name]    MR#:  [id number]  DATE OF BIRTH:  [birth year]  DATE OF ADMISSION:  10/18/2017  PRIMARY CARE PHYSICIAN: [name], MD   REQUESTING/REFERRING PHYSICIAN:   CHIEF COMPLAINT:   Chief Complaint  Patient presents with  . Weakness    HISTORY OF PRESENT ILLNESS: [name]  is a 83 y.o. male with a known history of chronic indwelling Foley for neurogenic bladder presenting with fevers to 100.3, generalized weakness/fatigue, headache, drowsiness/lethargy, in the emergency room patient was found to have urinalysis consistent with UTI, sodium 134, chloride 95, white count of 15,000, patient evaluated in the emergency room, multiple family members at the bedside including wife, patient now being admitted for acute recurrent complicated urinary tract infection.  PAST MEDICAL HISTORY:   Past Medical History:  Diagnosis Date  . Allergy   . Anemia   . Anxiety   . Arthritis    rheumatoid  . Asthma   . CAD (coronary artery disease)   . Cancer [facility])    Prostate  . Chronic sinusitis   . Depression   . Diverticulitis   . ED (erectile dysfunction)   . GERD (gastroesophageal reflux disease)   . History of SIADH   . Hx of adenomatous colonic polyps   . Hypertension   . Hyponatremia   . IBS (irritable bowel syndrome)   . Neurodermatitis   . Neurogenic bladder   . OSA (obstructive sleep apnea)     PAST SURGICAL HISTORY:  Past Surgical History:  Procedure Laterality Date  . KNEE ARTHROPLASTY Right 09/02/2015   Procedure: COMPUTER ASSISTED TOTAL KNEE ARTHROPLASTY;  Surgeon: [name], MD;  Location: [facility] ORS;  Service: Orthopedics;  Laterality: Right;  . KNEE SURGERY Left    ARTHROSCOPY LEFT  . NASAL SINUS SURGERY  2009   DEVIATED SEPTUM AND POLYPS  . PROSTATE SURGERY     PROSTATECTOMY  . TOTAL KNEE ARTHROPLASTY Left 9/15   Dr [name]  . URETHRAL STRICTURE DILATATION  02-2010   Dr.Cope     SOCIAL HISTORY:  Social History   Tobacco Use  . Smoking status: Never Smoker  . Smokeless tobacco: Never Used  Substance Use Topics  . Alcohol use: Yes    Alcohol/week: 4.8 oz    Types: 8 Standard drinks or equivalent per week    FAMILY HISTORY:  Family History  Problem Relation Age of Onset  . Heart disease Mother 66       heart failure  . Pneumonia Father 48       pnemonia  . Colon cancer Neg Hx     DRUG ALLERGIES:  Allergies  Allergen Reactions  . Ciprofloxacin Nausea And Vomiting    Headache  . Citalopram Hydrobromide Other (See Comments)    "unknown"  . Lorazepam     Adverse reaction  . Paroxetine Nausea Only  . Ramipril Other (See Comments)    "unknown"  . Simvastatin     REVIEW OF SYSTEMS:   CONSTITUTIONAL: No fever, +fatigue/weakness.  EYES: No blurred or double vision.  EARS, NOSE, AND THROAT: No tinnitus or ear pain.  RESPIRATORY: No cough, shortness of breath, wheezing or hemoptysis.  CARDIOVASCULAR: No chest pain, orthopnea, edema.  GASTROINTESTINAL: No nausea, vomiting, diarrhea or abdominal pain.  GENITOURINARY: No dysuria, hematuria.  ENDOCRINE: No polyuria, nocturia,  HEMATOLOGY: No anemia, easy bruising or bleeding SKIN: No rash or lesion. MUSCULOSKELETAL: No joint pain or arthritis.  NEUROLOGIC: No tingling, numbness, generalized weakness.  PSYCHIATRY: No anxiety or depression.   MEDICATIONS AT HOME:  Prior to Admission medications   Medication Sig Start Date End Date Taking? Authorizing Provider  ALPRAZolam Duanne Moron) 0.5 MG tablet TAKE 1 TABLET BY MOUTH THREE TIMES DAILY AS NEEDED FOR ANXIETY 10/18/17  Yes Elby Beck, FNP  aspirin 81 MG tablet Take 81 mg by mouth daily.   Yes [provider]  Cholecalciferol (VITAMIN D3) 5000 UNITS CAPS Take 1 capsule by mouth daily.   Yes [provider]  Coenzyme Q10 (CO Q 10) 100 MG CAPS Take 1 capsule by mouth daily.   Yes [provider]  Cranberry 425 MG CAPS  Take 2 capsules by mouth daily.   Yes [provider]  folic acid (FOLVITE) 1 MG tablet Take 1 tablet (1 mg total) by mouth daily. 04/27/17  Yes [name], MD  gabapentin (NEURONTIN) 300 MG capsule TAKE 1 CAPSULE BY MOUTH THREE TIMES DAILY 07/12/16  Yes [name], MD  hydroxychloroquine (PLAQUENIL) 200 MG tablet Take 1 tablet by mouth 2 (two) times daily. 01/22/16  Yes [provider]  methotrexate (RHEUMATREX) 2.5 MG tablet Take 10 tablets by mouth once a week. 01/07/16  Yes [provider]  Milk Thistle 250 MG CAPS Take 1 capsule by mouth daily.   Yes [provider]  Multiple Vitamin (MULTIVITAMIN) tablet Take 1 tablet by mouth daily.     Yes [provider]  omeprazole (PRILOSEC) 20 MG capsule TAKE 1 CAPSULE BY MOUTH EVERY DAY Patient taking differently: TAKE 1 CAPSULE BY MOUTH AS NEEDED 03/24/16  Yes Viviana Simpler I, MD  pravastatin (PRAVACHOL) 20 MG tablet TAKE 1 TABLET BY MOUTH EVERY DAY 05/29/17  Yes [name], MD  Probiotic Product (ALIGN) 4 MG CAPS Take 1 capsule (4 mg total) by mouth daily. 12/15/16  Yes Gouru, Illene Silver, MD  Selenium 200 MCG CAPS Take 1 capsule by mouth daily.   Yes [provider]  solifenacin (VESICARE) 5 MG tablet Take 5 mg by mouth at bedtime. For bladder spasms   Yes [provider]  traMADol (ULTRAM) 50 MG tablet Take 1 tablet (50 mg total) 4 (four) times daily as needed by mouth. 10/10/17  Yes [name], MD  Turmeric 500 MG CAPS Take 500 mg by mouth daily.   Yes [provider]  vitamin B-12 (CYANOCOBALAMIN) 500 MCG tablet Take 500 mcg by mouth daily.   Yes [provider]  albuterol (PROVENTIL HFA;VENTOLIN HFA) 108 (90 Base) MCG/ACT inhaler Inhale 2 puffs into the lungs every 6 (six) hours as needed for wheezing or shortness of breath. 11/14/16   [name], MD  fluticasone (FLONASE) 50 MCG/ACT nasal spray Place 2 sprays into both nostrils 2 (two) times  daily. 04/27/17   [name], MD  furosemide (LASIX) 20 MG tablet TAKE 1 TABLET BY MOUTH EVERY DAY Patient taking differently: TAKE 1 TABLET BY mouth 3 times a week 09/19/17   [name], MD  polyethylene glycol [facility]) packet Take 34 g by mouth daily.     [provider]      PHYSICAL EXAMINATION:   VITAL SIGNS: Blood pressure (!) 126/50, pulse 98, temperature 98.2 F (36.8 C), temperature source Oral, resp. rate (!) 22, height 5\' 11"  (1.803 m), weight 98 kg (216 lb), SpO2 96 %.  GENERAL:  83 y.o.-year-old patient lying in the bed with no acute distress.  Obese, nontoxic-appearing EYES: Pupils  equal, round, reactive to light and accommodation. No scleral icterus. Extraocular muscles intact.  HEENT: Head atraumatic, normocephalic. Oropharynx and nasopharynx clear.  NECK:  Supple, no jugular venous distention. No thyroid enlargement, no tenderness.  LUNGS: Normal breath sounds bilaterally, no wheezing, rales,rhonchi or crepitation. No use of accessory muscles of respiration.  CARDIOVASCULAR: S1, S2 normal. No murmurs, rubs, or gallops.  ABDOMEN: Soft, nontender, nondistended. Bowel sounds present. No organomegaly or mass.  EXTREMITIES: Bilateral lower extremity edema, cyanosis, or clubbing.  NEUROLOGIC: Cranial nerves II through XII are intact. MAES. Gait not checked.  PSYCHIATRIC: The patient is alert and oriented x 3.  SKIN: No obvious rash, lesion, or ulcer.   LABORATORY PANEL:   CBC Recent Labs  Lab 10/18/17 1448  WBC 15.8*  HGB 12.4*  HCT 36.1*  PLT 163  MCV 98.9  MCH 33.9  MCHC 34.3  RDW 14.0   ------------------------------------------------------------------------------------------------------------------  Chemistries  Recent Labs  Lab 10/18/17 1448  NA 134*  K 4.5  CL 95*  CO2 29  GLUCOSE 118*  BUN 20  CREATININE 1.26*  CALCIUM 8.4*    ------------------------------------------------------------------------------------------------------------------ estimated creatinine clearance is 54.9 mL/min (A) (by C-G formula based on SCr of 1.26 mg/dL (H)). ------------------------------------------------------------------------------------------------------------------ No results for input(s): TSH, T4TOTAL, T3FREE, THYROIDAB in the last 72 hours.  Invalid input(s): FREET3   Coagulation profile No results for input(s): INR, PROTIME in the last 168 hours. ------------------------------------------------------------------------------------------------------------------- No results for input(s): DDIMER in the last 72 hours. -------------------------------------------------------------------------------------------------------------------  Cardiac Enzymes No results for input(s): CKMB, TROPONINI, MYOGLOBIN in the last 168 hours.  Invalid input(s): CK ------------------------------------------------------------------------------------------------------------------ Invalid input(s): POCBNP  ---------------------------------------------------------------------------------------------------------------  Urinalysis    Component Value Date/Time   COLORURINE AMBER (A) 10/18/2017 1841   APPEARANCEUR CLOUDY (A) 10/18/2017 1841   APPEARANCEUR CLOUDY 09/28/2014 1610   LABSPEC 1.019 10/18/2017 1841   LABSPEC 1.025 09/28/2014 1610   PHURINE 5.0 10/18/2017 1841   GLUCOSEU NEGATIVE 10/18/2017 1841   GLUCOSEU see comment 09/28/2014 1610   HGBUR SMALL (A) 10/18/2017 1841   HGBUR trace-lysed 02/22/2010 1536   BILIRUBINUR NEGATIVE 10/18/2017 1841   BILIRUBINUR see comment 09/28/2014 1610   KETONESUR 5 (A) 10/18/2017 1841   PROTEINUR 100 (A) 10/18/2017 1841   UROBILINOGEN 0.2 02/22/2010 1536   NITRITE POSITIVE (A) 10/18/2017 1841   LEUKOCYTESUR SMALL (A) 10/18/2017 1841   LEUKOCYTESUR see comment 09/28/2014 1610     RADIOLOGY: No  results found.  EKG: Orders placed or performed during the hospital encounter of 10/18/17  . ED EKG  . ED EKG  . EKG 12-Lead  . EKG 12-Lead    IMPRESSION AND PLAN: 1 acute recurrent complicated urinary tract infection Secondary to chronic indwelling Foley, recently exchanged on Friday Admit to regular nursing floor bed, empiric Rocephin for 5-7-day course, follow-up cultures, Foley system to be exchanged with the patient's supplies-patient has a sensitivity to our equipment  2 history of coronary artery disease Stable Continue aspirin, statin therapy  3 chronic HLD, unspecified Stable Continue statin therapy  4 chronic asthma  Stable  Breathing treatments as needed  5 chronic neurogenic bladder Foley system and catheter be to be exchanged as stated above  Full code Condition stable Prognosis fair DVT prophylaxis with Lovenox subcu Disposition home in 2-3 days   All the records are reviewed and case discussed with ED provider. Management plans discussed with the patient, family and they are in agreement.  CODE STATUS: Code Status History    Date Active Date Inactive Code Status Order ID Comments  User Context   04/03/2017 06:32 04/05/2017 18:08 Full Code [id number]  Harrie Foreman, MD Inpatient   12/11/2016 21:39 12/15/2016 19:20 Full Code [id number]  Demetrios Loll, MD Inpatient   09/07/2015 15:05 09/08/2015 17:44 Full Code [id number]  Dustin Flock, MD Inpatient   09/02/2015 17:16 09/05/2015 16:52 Full Code [id number]  [name], MD Inpatient    Advance Directive Documentation     Most Recent Value  Type of Advance Directive  Living will, Healthcare Power of Attorney  Pre-existing out of facility DNR order (yellow form or pink MOST form)  No data  "MOST" Form in Place?  No data       TOTAL TIME TAKING CARE OF THIS PATIENT: 40 minutes.    Avel Peace Nahiem Dredge M.D on 10/18/2017   Between 7am to 6pm - Pager - [phone]  After 6pm go to [email or web address] - password  [facility] [place] [facility]  Office  [phone]  CC: Primary care physician; [name], MD   Note: This dictation was prepared with Dragon dictation along with smaller phrase technology. Any transcriptional errors that result from this process are unintentional.

## 2017-10-18 NOTE — ED Notes (Signed)
ED Provider at bedside. 

## 2017-10-18 NOTE — ED Notes (Addendum)
Per admitting Dr, change pt's urinary catheter to pt's brand of catheter he uses at home (per adm Dr, not the catheter we have in ED due to pt's discomfort with those). Pt's daughter went home to get sterile catheter, will change catheter when daughter returns.

## 2017-10-18 NOTE — ED Provider Notes (Signed)
-----------------------------------------   5:44 PM on 10/18/2017 -----------------------------------------  I received signout on this patient from Dr. Sullivan Lone.  Patient has history of recurrent UTIs, and presents with vital signs consistent with sepsis.  Awaiting UA and some of his other lab workup, and then plan for admission.  Patient's vital signs are now stable.  ----------------------------------------- 7:35 PM on 10/18/2017 -----------------------------------------  UA is consistent with UTI.  Antibiotics have been given.  Patient is hemodynamically stable.  Will proceed with admission.  I signed out the patient to hospitalist Dr. Clyda Hurdle.   Arta Silence, MD 10/18/17 [phone]

## 2017-10-18 NOTE — ED Triage Notes (Signed)
Pt comes into the ED via POV c/o weakness that started yesterday.  Patient concerned it may be related to a UTI due to him having an indwelling catheter.  Patient had fevers at home at 100.3.  States overall weakness and headache.  Patient has significant h/o UTI and similar symptoms when he undergoes recent indwelling catheter change outs.  Patient is neurologically intact and in NAD at this time with even and unlabored respirations.

## 2017-10-19 ENCOUNTER — Other Ambulatory Visit: Payer: Self-pay

## 2017-10-19 LAB — CBC
HEMATOCRIT: 33.6 % — AB (ref 40.0–52.0)
HEMOGLOBIN: 11.6 g/dL — AB (ref 13.0–18.0)
MCH: 33.8 pg (ref 26.0–34.0)
MCHC: 34.4 g/dL (ref 32.0–36.0)
MCV: 98.3 fL (ref 80.0–100.0)
Platelets: 111 10*3/uL — ABNORMAL LOW (ref 150–440)
RBC: 3.42 MIL/uL — ABNORMAL LOW (ref 4.40–5.90)
RDW: 13.8 % (ref 11.5–14.5)
WBC: 16.2 10*3/uL — AB (ref 3.8–10.6)

## 2017-10-19 LAB — BASIC METABOLIC PANEL
Anion gap: 9 (ref 5–15)
BUN: 22 mg/dL — AB (ref 6–20)
CHLORIDE: 95 mmol/L — AB (ref 101–111)
CO2: 27 mmol/L (ref 22–32)
Calcium: 7.7 mg/dL — ABNORMAL LOW (ref 8.9–10.3)
Creatinine, Ser: 1.1 mg/dL (ref 0.61–1.24)
GFR calc Af Amer: >60 mL/min (ref >60)
GFR calc non Af Amer: >60 mL/min (ref >60)
Glucose, Bld: 98 mg/dL (ref 65–99)
POTASSIUM: 4.3 mmol/L (ref 3.5–5.1)
Sodium: 131 mmol/L — ABNORMAL LOW (ref 135–145)

## 2017-10-19 MED ORDER — FUROSEMIDE 40 MG PO TABS
40.0000 mg | ORAL_TABLET | Freq: Every day | ORAL | Status: DC
  Administered 2017-10-19 – 2017-10-22 (×4): 40 mg via ORAL
  Filled 2017-10-19 (×4): qty 1

## 2017-10-19 NOTE — Progress Notes (Signed)
RN reported that pts O2 sat dropped to 85 on [place] at 6 lpm after he fell asleep. (He has OSA)  Placed him on a 55% venturi mask. Sat rose to 96%.

## 2017-10-19 NOTE — Progress Notes (Signed)
[facility] at [place] NAME: [name]    MR#:  [id number]  DATE OF BIRTH:  [birth year]  SUBJECTIVE:  CHIEF COMPLAINT:   Chief Complaint  Patient presents with  . Weakness   Patient is awake and alert.  Feels weak.  REVIEW OF SYSTEMS:    Review of Systems  Constitutional: Positive for malaise/fatigue. Negative for chills and fever.  HENT: Negative for sore throat.   Eyes: Negative for blurred vision, double vision and pain.  Respiratory: Negative for cough, hemoptysis, shortness of breath and wheezing.   Cardiovascular: Negative for chest pain, palpitations, orthopnea and leg swelling.  Gastrointestinal: Negative for abdominal pain, constipation, diarrhea, heartburn, nausea and vomiting.  Genitourinary: Negative for dysuria and hematuria.  Musculoskeletal: Negative for back pain and joint pain.  Skin: Negative for rash.  Neurological: Positive for weakness. Negative for sensory change, speech change, focal weakness and headaches.  Endo/Heme/Allergies: Does not bruise/bleed easily.  Psychiatric/Behavioral: Negative for depression. The patient is not nervous/anxious.     DRUG ALLERGIES:   Allergies  Allergen Reactions  . Ciprofloxacin Nausea And Vomiting    Headache  . Citalopram Hydrobromide Other (See Comments)    "unknown"  . Lorazepam     Adverse reaction  . Paroxetine Nausea Only  . Ramipril Other (See Comments)    "unknown"  . Simvastatin     VITALS:  Blood pressure (!) 127/38, pulse 89, temperature 98.2 F (36.8 C), temperature source Oral, resp. rate 16, height 5\' 11"  (1.803 m), weight 100.1 kg (220 lb 9.6 oz), SpO2 98 %.  PHYSICAL EXAMINATION:   Physical Exam  GENERAL:  83 y.o.-year-old patient lying in the bed with no acute distress.  EYES: Pupils equal, round, reactive to light and accommodation. No scleral icterus. Extraocular muscles intact.  HEENT: Head atraumatic, normocephalic. Oropharynx and nasopharynx  clear.  NECK:  Supple, no jugular venous distention. No thyroid enlargement, no tenderness.  LUNGS: Normal breath sounds bilaterally, no wheezing, rales, rhonchi. No use of accessory muscles of respiration.  CARDIOVASCULAR: S1, S2 normal. No murmurs, rubs, or gallops.  ABDOMEN: Soft, nontender, nondistended. Bowel sounds present. Foley catheter in place EXTREMITIES: Bilateral lower extremity edema NEUROLOGIC: Cranial nerves II through XII are intact. No focal Motor or sensory deficits b/l.   PSYCHIATRIC: The patient is alert and oriented x 3.  SKIN: No obvious rash, lesion, or ulcer.   LABORATORY PANEL:   CBC Recent Labs  Lab 10/19/17 0402  WBC 16.2*  HGB 11.6*  HCT 33.6*  PLT 111*   ------------------------------------------------------------------------------------------------------------------ Chemistries  Recent Labs  Lab 10/19/17 0402  NA 131*  K 4.3  CL 95*  CO2 27  GLUCOSE 98  BUN 22*  CREATININE 1.10  CALCIUM 7.7*   ------------------------------------------------------------------------------------------------------------------  Cardiac Enzymes No results for input(s): TROPONINI in the last 168 hours. ------------------------------------------------------------------------------------------------------------------  RADIOLOGY:  No results found.   ASSESSMENT AND PLAN:   *Foley catheter related UTI Catheter changed yesterday.  On IV ceftriaxone.  Will wait for cultures.  Afebrile today.  * History of coronary artery disease Stable Continue aspirin, statin therapy  * Asthma.  Stable.  Continue nebulizers as needed.  * chronic neurogenic bladder Foley catheter in place   All the records are reviewed and case discussed with Care Management/Social Worker Management plans discussed with the patient, family and they are in agreement.  CODE STATUS: FULL CODE  DVT Prophylaxis: SCDs  TOTAL TIME TAKING CARE OF THIS PATIENT: 35 minutes.   POSSIBLE  D/C IN 1-2 DAYS, DEPENDING ON CLINICAL CONDITION.  Neita Carp M.D on 10/19/2017 at 12:10 PM  Between 7am to 6pm - Pager - [phone]  After 6pm go to [email or web address] - password [email or web address] [facility]  Office  [phone]  CC: Primary care physician; [name], MD  Note: This dictation was prepared with Dragon dictation along with smaller phrase technology. Any transcriptional errors that result from this process are unintentional.

## 2017-10-20 ENCOUNTER — Inpatient Hospital Stay: Payer: Medicare PPO

## 2017-10-20 LAB — CBC WITH DIFFERENTIAL/PLATELET
BASOS PCT: 0 %
Basophils Absolute: 0 10*3/uL (ref 0–0.1)
EOS ABS: 0 10*3/uL (ref 0–0.7)
Eosinophils Relative: 0 %
HCT: 34.3 % — ABNORMAL LOW (ref 40.0–52.0)
Hemoglobin: 11.8 g/dL — ABNORMAL LOW (ref 13.0–18.0)
Lymphocytes Relative: 4 %
Lymphs Abs: 0.4 10*3/uL — ABNORMAL LOW (ref 1.0–3.6)
MCH: 33.8 pg (ref 26.0–34.0)
MCHC: 34.4 g/dL (ref 32.0–36.0)
MCV: 98.2 fL (ref 80.0–100.0)
MONO ABS: 0.5 10*3/uL (ref 0.2–1.0)
MONOS PCT: 5 %
Neutro Abs: 10.2 10*3/uL — ABNORMAL HIGH (ref 1.4–6.5)
Neutrophils Relative %: 91 %
Platelets: 114 10*3/uL — ABNORMAL LOW (ref 150–440)
RBC: 3.49 MIL/uL — ABNORMAL LOW (ref 4.40–5.90)
RDW: 14 % (ref 11.5–14.5)
WBC: 11.2 10*3/uL — ABNORMAL HIGH (ref 3.8–10.6)

## 2017-10-20 LAB — URINE CULTURE

## 2017-10-20 MED ORDER — FUROSEMIDE 40 MG PO TABS
40.0000 mg | ORAL_TABLET | Freq: Once | ORAL | Status: AC
  Administered 2017-10-20: 40 mg via ORAL
  Filled 2017-10-20: qty 1

## 2017-10-20 MED ORDER — IPRATROPIUM-ALBUTEROL 0.5-2.5 (3) MG/3ML IN SOLN
3.0000 mL | Freq: Four times a day (QID) | RESPIRATORY_TRACT | Status: DC
  Administered 2017-10-20 – 2017-10-21 (×5): 3 mL via RESPIRATORY_TRACT
  Filled 2017-10-20 (×5): qty 3

## 2017-10-20 NOTE — Progress Notes (Signed)
[facility] at [place] NAME: [name]    MR#:  [id number]  DATE OF BIRTH:  1934-09-28  SUBJECTIVE:  CHIEF COMPLAINT:   Chief Complaint  Patient presents with  . Weakness   feels weak. On 2 L o2  Fever 102 overnight  REVIEW OF SYSTEMS:    Review of Systems  Constitutional: Positive for malaise/fatigue. Negative for chills and fever.  HENT: Negative for sore throat.   Eyes: Negative for blurred vision, double vision and pain.  Respiratory: Negative for cough, hemoptysis, shortness of breath and wheezing.   Cardiovascular: Negative for chest pain, palpitations, orthopnea and leg swelling.  Gastrointestinal: Negative for abdominal pain, constipation, diarrhea, heartburn, nausea and vomiting.  Genitourinary: Negative for dysuria and hematuria.  Musculoskeletal: Negative for back pain and joint pain.  Skin: Negative for rash.  Neurological: Positive for weakness. Negative for sensory change, speech change, focal weakness and headaches.  Endo/Heme/Allergies: Does not bruise/bleed easily.  Psychiatric/Behavioral: Negative for depression. The patient is not nervous/anxious.     DRUG ALLERGIES:   Allergies  Allergen Reactions  . Ciprofloxacin Nausea And Vomiting    Headache  . Citalopram Hydrobromide Other (See Comments)    "unknown"  . Lorazepam     Adverse reaction  . Paroxetine Nausea Only  . Ramipril Other (See Comments)    "unknown"  . Simvastatin     VITALS:  Blood pressure (!) 129/49, pulse 89, temperature 99 F (37.2 C), temperature source Oral, resp. rate 17, height 5\' 11"  (1.803 m), weight 100.1 kg (220 lb 9.6 oz), SpO2 97 %.  PHYSICAL EXAMINATION:   Physical Exam  GENERAL:  83 y.o.-year-old patient lying in the bed with no acute distress.  EYES: Pupils equal, round, reactive to light and accommodation. No scleral icterus. Extraocular muscles intact.  HEENT: Head atraumatic, normocephalic. Oropharynx and nasopharynx  clear.  NECK:  Supple, no jugular venous distention. No thyroid enlargement, no tenderness.  LUNGS: Normal breath sounds bilaterally, no wheezing, rales, rhonchi. No use of accessory muscles of respiration.  CARDIOVASCULAR: S1, S2 normal. No murmurs, rubs, or gallops.  ABDOMEN: Soft, nontender, nondistended. Bowel sounds present. Foley catheter in place EXTREMITIES: Bilateral lower extremity edema NEUROLOGIC: Cranial nerves II through XII are intact. No focal Motor or sensory deficits b/l.   PSYCHIATRIC: The patient is alert and oriented x 3.  SKIN: No obvious rash, lesion, or ulcer.   LABORATORY PANEL:   CBC Recent Labs  Lab 10/20/17 0307  WBC 11.2*  HGB 11.8*  HCT 34.3*  PLT 114*   ------------------------------------------------------------------------------------------------------------------ Chemistries  Recent Labs  Lab 10/19/17 0402  NA 131*  K 4.3  CL 95*  CO2 27  GLUCOSE 98  BUN 22*  CREATININE 1.10  CALCIUM 7.7*   ------------------------------------------------------------------------------------------------------------------  Cardiac Enzymes No results for input(s): TROPONINI in the last 168 hours. ------------------------------------------------------------------------------------------------------------------  RADIOLOGY:  Dg Chest 2 View  Result Date: 10/20/2017 CLINICAL DATA:  Hypoxia.  Weakness. EXAM: CHEST  2 VIEW COMPARISON:  Chest CT 09/18/2017.  Chest radiograph 03/09/2017 FINDINGS: Decreased lung volumes with vascular crowding and atelectasis at the medial right lung base. Scarring and/or atelectasis at the left lung base. Evidence for small pleural effusions based on the lateral view. Atherosclerotic calcifications at the aortic arch. Heart size is within normal limits. Subchondral sclerosis and joint space narrowing at the glenohumeral joints bilaterally. Incidentally, there is an azygos lobe. IMPRESSION: Low lung volumes with small pleural  effusions and bibasilar atelectasis. Electronically Signed  By: Markus Daft M.D.   On: 10/20/2017 09:26     ASSESSMENT AND PLAN:   *Foley catheter related UTI Catheter changed yesterday.  On IV ceftriaxone.  Will wait for cultures.   Spiked a fever Wait for blood cultures. One more day in the hospital atleast  PT consult  * History of coronary artery disease Stable Continue aspirin, statin therapy  * Asthma.  Stable.  Continue nebulizers as needed.  * chronic neurogenic bladder Foley catheter in place  * Acute hypoxic resp failure Check CXR Start lasix Start nebs   All the records are reviewed and case discussed with Care Management/Social Worker Management plans discussed with the patient, family and they are in agreement.  CODE STATUS: FULL CODE  DVT Prophylaxis: SCDs  TOTAL TIME TAKING CARE OF THIS PATIENT: 35 minutes.   POSSIBLE D/C IN 1-2 DAYS, DEPENDING ON CLINICAL CONDITION.  Leia Alf Zyan Mirkin M.D on 10/20/2017 at 1:16 PM  Between 7am to 6pm - Pager - [phone]  After 6pm go to [email or web address] - password [email or web address] [facility]  Office  [phone]  CC: Primary care physician; [name], MD  Note: This dictation was prepared with Dragon dictation along with smaller phrase technology. Any transcriptional errors that result from this process are unintentional.

## 2017-10-20 NOTE — Progress Notes (Signed)
Pt remaining alert but still with periods of confusion. No complaints of pain this shift. Pt spiked fever this evening, Tylenol given with good results. Pt mouth breaths while sleeping. Pt oxygen desaturated while sleeping placed on mask while asleep. Pt  Oxygen comes back up when awake.

## 2017-10-21 LAB — BASIC METABOLIC PANEL
Anion gap: 7 (ref 5–15)
BUN: 20 mg/dL (ref 6–20)
CHLORIDE: 94 mmol/L — AB (ref 101–111)
CO2: 30 mmol/L (ref 22–32)
Calcium: 8 mg/dL — ABNORMAL LOW (ref 8.9–10.3)
Creatinine, Ser: 0.7 mg/dL (ref 0.61–1.24)
GFR calc Af Amer: >60 mL/min (ref >60)
GFR calc non Af Amer: >60 mL/min (ref >60)
GLUCOSE: 113 mg/dL — AB (ref 65–99)
POTASSIUM: 3.3 mmol/L — AB (ref 3.5–5.1)
Sodium: 131 mmol/L — ABNORMAL LOW (ref 135–145)

## 2017-10-21 MED ORDER — POTASSIUM CHLORIDE CRYS ER 20 MEQ PO TBCR
40.0000 meq | EXTENDED_RELEASE_TABLET | ORAL | Status: AC
  Administered 2017-10-21 (×2): 40 meq via ORAL
  Filled 2017-10-21 (×2): qty 2

## 2017-10-21 MED ORDER — FUROSEMIDE 40 MG PO TABS
40.0000 mg | ORAL_TABLET | Freq: Once | ORAL | Status: AC
  Administered 2017-10-21: 40 mg via ORAL
  Filled 2017-10-21: qty 1

## 2017-10-21 NOTE — Evaluation (Signed)
Physical Therapy Evaluation Patient Details Name: [name] MRN: [id number] DOB: 1934/09/26 Today's Date: 10/21/2017   History of Present Illness  83 yo male with onset of UTIand acute on chronic resp failure was admitted, has become weak and now using 2L O2.  PMHx:  neurogenic bladder, catheter use, IBS, polyps, HTN, OSA, RA, CAD, SIADH (inappropriate antidiuretic hormone secretion)  Clinical Impression  Pt has been seen today to assess his mobility and tolerance for activity, with a poor standing balance control and mod assist needed to power up.  He is not a candidate for going directly home unless family is willing to be continually accessible for mobility and safety, and understand he is a higher fall risk.  He will continue acute therapy and will have SNF recommendation until he demonstrates more independence with all gait and transfers.      Follow Up Recommendations SNF    Equipment Recommendations  Rolling walker with 5" wheels    Recommendations for Other Services       Precautions / Restrictions Precautions Precautions: Fall Restrictions Weight Bearing Restrictions: No      Mobility  Bed Mobility Overal bed mobility: Needs Assistance Bed Mobility: Supine to Sit     Supine to sit: Mod assist;Min assist     General bed mobility comments: assisted to lift trunk and finish scooting to EOB but pt is also exerting a low effort  Transfers Overall transfer level: Needs assistance Equipment used: Rolling walker (2 wheeled);1 person hand held assist Transfers: Sit to/from Stand Sit to Stand: Min assist;Mod assist         General transfer comment: counted and cued hand placement to power up  Ambulation/Gait Ambulation/Gait assistance: Min assist Ambulation Distance (Feet): 10 Feet Assistive device: Rolling walker (2 wheeled);1 person hand held assist Gait Pattern/deviations: Step-to pattern;Step-through pattern;Wide base of support;Trunk  flexed;Shuffle;Decreased stride length Gait velocity: reduced Gait velocity interpretation: Below normal speed for age/gender General Gait Details: had to keep a chair close as pt has a buckling appearance with all gait  Stairs            Wheelchair Mobility    Modified Rankin (Stroke Patients Only)       Balance Overall balance assessment: Needs assistance Sitting-balance support: Feet supported;Bilateral upper extremity supported Sitting balance-[name]: Fair     Standing balance support: Bilateral upper extremity supported;During functional activity Standing balance-[name]: Poor                               Pertinent Vitals/Pain Pain Assessment: No/denies pain    Home Living Family/patient expects to be discharged to:: Private residence Living Arrangements: Spouse/significant other;Children Available Help at Discharge: Family Type of Home: House Home Access: Stairs to enter Entrance Stairs-Rails: Psychiatric nurse of Steps: 4 Home Layout: Two level;Able to live on main level with bedroom/bathroom Home Equipment: Gilford Rile - 2 wheels;Cane - single point Additional Comments: pt states his daughter very able to physically assist him but wife cannot    Prior Function Level of Independence: Independent with assistive device(s)         Comments: Mod I with all self care     Hand Dominance   Dominant Hand: Right    Extremity/Trunk Assessment   Upper Extremity Assessment Upper Extremity Assessment: Overall WFL for tasks assessed    Lower Extremity Assessment Lower Extremity Assessment: Generalized weakness    Cervical / Trunk Assessment Cervical / Trunk  Assessment: Normal  Communication   Communication: HOH  Cognition Arousal/Alertness: Awake/alert Behavior During Therapy: WFL for tasks assessed/performed Overall Cognitive Status: Within Functional Limits for tasks assessed                                         General Comments      Exercises     Assessment/Plan    PT Assessment Patient needs continued PT services  PT Problem List Decreased strength;Decreased range of motion;Decreased activity tolerance;Decreased balance;Decreased mobility;Decreased coordination;Decreased knowledge of use of DME;Decreased safety awareness;Cardiopulmonary status limiting activity;Obesity;Decreased skin integrity       PT Treatment Interventions DME instruction;Gait training;Stair training;Functional mobility training;Therapeutic activities;Therapeutic exercise;Balance training;Neuromuscular re-education;Patient/family education    PT Goals (Current goals can be found in the Care Plan section)  Acute Rehab PT Goals Patient Stated Goal: to get stronger and get home PT Goal Formulation: With patient Time For Goal Achievement: 11/04/17 Potential to Achieve Goals: Good    Frequency Min 2X/week   Barriers to discharge Inaccessible home environment;Decreased caregiver support wife cannot assist him    Co-evaluation               AM-PAC PT "6 Clicks" Daily Activity  Outcome Measure Difficulty turning over in bed (including adjusting bedclothes, sheets and blankets)?: Unable Difficulty moving from lying on back to sitting on the side of the bed? : Unable Difficulty sitting down on and standing up from a chair with arms (e.g., wheelchair, bedside commode, etc,.)?: Unable Help needed moving to and from a bed to chair (including a wheelchair)?: A Lot Help needed walking in hospital room?: A Lot Help needed climbing 3-5 steps with a railing? : Total 6 Click Score: 8    End of Session Equipment Utilized During Treatment: Gait belt;Oxygen Activity Tolerance: Patient tolerated treatment well;Patient limited by fatigue Patient left: in chair;with call bell/phone within reach;Other (comment)(on BSC and talked with nursing to transition) Nurse Communication: Mobility status PT Visit  Diagnosis: Unsteadiness on feet (R26.81);Muscle weakness (generalized) (M62.81);Other abnormalities of gait and mobility (R26.89);Apraxia (R48.2);Difficulty in walking, not elsewhere classified (R26.2)    Time: 4650-3546 PT Time Calculation (min) (ACUTE ONLY): 59 min   Charges:   PT Evaluation $PT Eval Moderate Complexity: 1 Mod PT Treatments $Gait Training: 8-22 mins $Therapeutic Exercise: 8-22 mins $Therapeutic Activity: 8-22 mins   PT G Codes:   PT G-Codes **NOT FOR INPATIENT CLASS** Functional Assessment Tool Used: AM-PAC 6 Clicks Basic Mobility    Ramond Dial 10/21/2017, 3:26 PM   Mee Hives, PT MS Acute Rehab Dept. Number: [place] and [place]

## 2017-10-21 NOTE — Progress Notes (Addendum)
[facility] at [place] NAME: [name]    MR#:  [id number]  DATE OF BIRTH:  10-Dec-1933  SUBJECTIVE:  CHIEF COMPLAINT:   Chief Complaint  Patient presents with  . Weakness   Sitting in a chair today.  Daughter at bedside.  Afebrile.  Has chronic Foley catheter.  Continues to feel extremely weak.  Ambulates with a walker at baseline.  REVIEW OF SYSTEMS:    Review of Systems  Constitutional: Positive for malaise/fatigue. Negative for chills and fever.  HENT: Negative for sore throat.   Eyes: Negative for blurred vision, double vision and pain.  Respiratory: Negative for cough, hemoptysis, shortness of breath and wheezing.   Cardiovascular: Negative for chest pain, palpitations, orthopnea and leg swelling.  Gastrointestinal: Negative for abdominal pain, constipation, diarrhea, heartburn, nausea and vomiting.  Genitourinary: Negative for dysuria and hematuria.  Musculoskeletal: Negative for back pain and joint pain.  Skin: Negative for rash.  Neurological: Positive for weakness. Negative for sensory change, speech change, focal weakness and headaches.  Endo/Heme/Allergies: Does not bruise/bleed easily.  Psychiatric/Behavioral: Negative for depression. The patient is not nervous/anxious.     DRUG ALLERGIES:   Allergies  Allergen Reactions  . Ciprofloxacin Nausea And Vomiting    Headache  . Citalopram Hydrobromide Other (See Comments)    "unknown"  . Lorazepam     Adverse reaction  . Paroxetine Nausea Only  . Ramipril Other (See Comments)    "unknown"  . Simvastatin     VITALS:  Blood pressure (!) 126/54, pulse 80, temperature 98.2 F (36.8 C), temperature source Oral, resp. rate 17, height 5\' 11"  (1.803 m), weight 100.1 kg (220 lb 9.6 oz), SpO2 95 %.  PHYSICAL EXAMINATION:   Physical Exam  GENERAL:  83 y.o.-year-old patient lying in the bed with no acute distress.  EYES: Pupils equal, round, reactive to light and  accommodation. No scleral icterus. Extraocular muscles intact.  HEENT: Head atraumatic, normocephalic. Oropharynx and nasopharynx clear.  NECK:  Supple, no jugular venous distention. No thyroid enlargement, no tenderness.  LUNGS: Normal breath sounds bilaterally, no wheezing, rales, rhonchi. No use of accessory muscles of respiration.  CARDIOVASCULAR: S1, S2 normal. No murmurs, rubs, or gallops.  ABDOMEN: Soft, nontender, nondistended. Bowel sounds present. Foley catheter in place EXTREMITIES: Bilateral lower extremity edema NEUROLOGIC: Cranial nerves II through XII are intact. No focal Motor or sensory deficits b/l.   PSYCHIATRIC: The patient is alert and oriented x 3.  SKIN: No obvious rash, lesion, or ulcer.   LABORATORY PANEL:   CBC Recent Labs  Lab 10/20/17 0307  WBC 11.2*  HGB 11.8*  HCT 34.3*  PLT 114*   ------------------------------------------------------------------------------------------------------------------ Chemistries  Recent Labs  Lab 10/21/17 0324  NA 131*  K 3.3*  CL 94*  CO2 30  GLUCOSE 113*  BUN 20  CREATININE 0.70  CALCIUM 8.0*   ------------------------------------------------------------------------------------------------------------------  Cardiac Enzymes No results for input(s): TROPONINI in the last 168 hours. ------------------------------------------------------------------------------------------------------------------  RADIOLOGY:  Dg Chest 2 View  Result Date: 10/20/2017 CLINICAL DATA:  Hypoxia.  Weakness. EXAM: CHEST  2 VIEW COMPARISON:  Chest CT 09/18/2017.  Chest radiograph 03/09/2017 FINDINGS: Decreased lung volumes with vascular crowding and atelectasis at the medial right lung base. Scarring and/or atelectasis at the left lung base. Evidence for small pleural effusions based on the lateral view. Atherosclerotic calcifications at the aortic arch. Heart size is within normal limits. Subchondral sclerosis and joint space narrowing  at the glenohumeral joints bilaterally. Incidentally, there  is an azygos lobe. IMPRESSION: Low lung volumes with small pleural effusions and bibasilar atelectasis. Electronically Signed   By: Markus Daft M.D.   On: 10/20/2017 09:26     ASSESSMENT AND PLAN:   *Foley catheter related UTI Catheter changed yesterday.  On IV ceftriaxone.  Cultures show mixed bacteria. Afebrile over last 24 hours. Continue IV antibiotics.  Likely discharge tomorrow to rehab.   * History of coronary artery disease Stable Continue aspirin, statin therapy  * Asthma.  Stable.  Continue nebulizers as needed.  * chronic neurogenic bladder Foley catheter in place  * Acute hypoxic resp failure Chest x-ray showed mild vascular congestion.  Restarted home Lasix and stopped IV fluids.  Improved.  Off oxygen today.  *History of sleep apnea.  Has not been using CPAP for many years now.  Will need pulmonary consult as outpatient.  * Chronic hyponatremia Stable   All the records are reviewed and case discussed with Care Management/Social Worker Management plans discussed with the patient, family and they are in agreement.  CODE STATUS: FULL CODE  DVT Prophylaxis: SCDs  TOTAL TIME TAKING CARE OF THIS PATIENT: 35 minutes.   POSSIBLE D/C IN 1-2 DAYS, DEPENDING ON CLINICAL CONDITION.  Neita Carp M.D on 10/21/2017 at 3:16 PM  Between 7am to 6pm - Pager - [phone]  After 6pm go to [email or web address] - password [email or web address] [facility]  Office  [phone]  CC: Primary care physician; [name], MD  Note: This dictation was prepared with Dragon dictation along with smaller phrase technology. Any transcriptional errors that result from this process are unintentional.

## 2017-10-21 NOTE — Progress Notes (Signed)
Nasal cpap initiated. Pt tolerating well although somewhat apprehensive. He had a multitude of related questions which I answered.

## 2017-10-22 DIAGNOSIS — M069 Rheumatoid arthritis, unspecified: Secondary | ICD-10-CM | POA: Diagnosis not present

## 2017-10-22 DIAGNOSIS — R2689 Other abnormalities of gait and mobility: Secondary | ICD-10-CM | POA: Diagnosis not present

## 2017-10-22 DIAGNOSIS — K219 Gastro-esophageal reflux disease without esophagitis: Secondary | ICD-10-CM | POA: Diagnosis not present

## 2017-10-22 DIAGNOSIS — J9600 Acute respiratory failure, unspecified whether with hypoxia or hypercapnia: Secondary | ICD-10-CM | POA: Diagnosis not present

## 2017-10-22 DIAGNOSIS — N318 Other neuromuscular dysfunction of bladder: Secondary | ICD-10-CM | POA: Diagnosis not present

## 2017-10-22 DIAGNOSIS — Z79899 Other long term (current) drug therapy: Secondary | ICD-10-CM | POA: Diagnosis not present

## 2017-10-22 DIAGNOSIS — E222 Syndrome of inappropriate secretion of antidiuretic hormone: Secondary | ICD-10-CM | POA: Diagnosis not present

## 2017-10-22 DIAGNOSIS — N390 Urinary tract infection, site not specified: Secondary | ICD-10-CM | POA: Diagnosis not present

## 2017-10-22 DIAGNOSIS — F39 Unspecified mood [affective] disorder: Secondary | ICD-10-CM | POA: Diagnosis not present

## 2017-10-22 DIAGNOSIS — M6281 Muscle weakness (generalized): Secondary | ICD-10-CM | POA: Diagnosis not present

## 2017-10-22 DIAGNOSIS — R339 Retention of urine, unspecified: Secondary | ICD-10-CM | POA: Diagnosis not present

## 2017-10-22 DIAGNOSIS — J45909 Unspecified asthma, uncomplicated: Secondary | ICD-10-CM | POA: Diagnosis not present

## 2017-10-22 DIAGNOSIS — E039 Hypothyroidism, unspecified: Secondary | ICD-10-CM | POA: Diagnosis not present

## 2017-10-22 DIAGNOSIS — E785 Hyperlipidemia, unspecified: Secondary | ICD-10-CM | POA: Diagnosis not present

## 2017-10-22 DIAGNOSIS — E559 Vitamin D deficiency, unspecified: Secondary | ICD-10-CM | POA: Diagnosis not present

## 2017-10-22 DIAGNOSIS — R278 Other lack of coordination: Secondary | ICD-10-CM | POA: Diagnosis not present

## 2017-10-22 DIAGNOSIS — N319 Neuromuscular dysfunction of bladder, unspecified: Secondary | ICD-10-CM | POA: Diagnosis not present

## 2017-10-22 DIAGNOSIS — I10 Essential (primary) hypertension: Secondary | ICD-10-CM | POA: Diagnosis not present

## 2017-10-22 DIAGNOSIS — I2510 Atherosclerotic heart disease of native coronary artery without angina pectoris: Secondary | ICD-10-CM | POA: Diagnosis not present

## 2017-10-22 DIAGNOSIS — D649 Anemia, unspecified: Secondary | ICD-10-CM | POA: Diagnosis not present

## 2017-10-22 MED ORDER — IPRATROPIUM-ALBUTEROL 0.5-2.5 (3) MG/3ML IN SOLN: 3.0000 mL | Freq: Four times a day (QID) | RESPIRATORY_TRACT | Status: DC | PRN

## 2017-10-22 MED ORDER — POTASSIUM CHLORIDE CRYS ER 20 MEQ PO TBCR
40.0000 meq | EXTENDED_RELEASE_TABLET | Freq: Once | ORAL | Status: AC
  Administered 2017-10-22: 20 meq via ORAL
  Filled 2017-10-22: qty 2

## 2017-10-22 MED ORDER — CEPHALEXIN 250 MG PO CAPS: 250.0000 mg | ORAL_CAPSULE | Freq: Four times a day (QID) | ORAL | 0 refills | Status: AC

## 2017-10-22 MED ORDER — ALPRAZOLAM 0.5 MG PO TABS: 0.5000 mg | ORAL_TABLET | Freq: Three times a day (TID) | ORAL | 0 refills | Status: DC | PRN

## 2017-10-22 NOTE — Progress Notes (Signed)
[facility] NAME: [name]    MR#:  [id number]  DATE OF BIRTH:  [birth year]  SUBJECTIVE:  CHIEF COMPLAINT:   Chief Complaint  Patient presents with  . Weakness  Wants to go to [facility] as he needs 10 days out of Hospital to get his asthma meds started REVIEW OF SYSTEMS:    Review of Systems  Constitutional: Positive for malaise/fatigue. Negative for chills and fever.  HENT: Negative for sore throat.   Eyes: Negative for blurred vision, double vision and pain.  Respiratory: Negative for cough, hemoptysis, shortness of breath and wheezing.   Cardiovascular: Negative for chest pain, palpitations, orthopnea and leg swelling.  Gastrointestinal: Negative for abdominal pain, constipation, diarrhea, heartburn, nausea and vomiting.  Genitourinary: Negative for dysuria and hematuria.  Musculoskeletal: Negative for back pain and joint pain.  Skin: Negative for rash.  Neurological: Positive for weakness. Negative for sensory change, speech change, focal weakness and headaches.  Endo/Heme/Allergies: Does not bruise/bleed easily.  Psychiatric/Behavioral: Negative for depression. The patient is not nervous/anxious.    DRUG ALLERGIES:   Allergies  Allergen Reactions  . Ciprofloxacin Nausea And Vomiting    Headache  . Citalopram Hydrobromide Other (See Comments)    "unknown"  . Lorazepam     Adverse reaction  . Paroxetine Nausea Only  . Ramipril Other (See Comments)    "unknown"  . Simvastatin     VITALS:  Blood pressure (!) 125/53, pulse 74, temperature 98.6 F (37 C), temperature source Oral, resp. rate 20, height 5\' 11"  (1.803 m), weight 100.1 kg (220 lb 9.6 oz), SpO2 95 %. PHYSICAL EXAMINATION:   Physical Exam  GENERAL:  83 y.o.-year-old patient lying in the bed with no acute distress.  EYES: Pupils equal, round, reactive to light and accommodation. No scleral icterus. Extraocular muscles intact.  HEENT: Head atraumatic,  normocephalic. Oropharynx and nasopharynx clear.  NECK:  Supple, no jugular venous distention. No thyroid enlargement, no tenderness.  LUNGS: Normal breath sounds bilaterally, no wheezing, rales, rhonchi. No use of accessory muscles of respiration.  CARDIOVASCULAR: S1, S2 normal. No murmurs, rubs, or gallops.  ABDOMEN: Soft, nontender, nondistended. Bowel sounds present. Foley catheter in place EXTREMITIES: Bilateral lower extremity edema NEUROLOGIC: Cranial nerves II through XII are intact. No focal Motor or sensory deficits b/l.   PSYCHIATRIC: The patient is alert and oriented x 3.  SKIN: No obvious rash, lesion, or ulcer.  LABORATORY PANEL:   CBC Recent Labs  Lab 10/20/17 0307  WBC 11.2*  HGB 11.8*  HCT 34.3*  PLT 114*   ------------------------------------------------------------------------------------------------------------------ Chemistries  Recent Labs  Lab 10/21/17 0324  NA 131*  K 3.3*  CL 94*  CO2 30  GLUCOSE 113*  BUN 20  CREATININE 0.70  CALCIUM 8.0*   ------------------------------------------------------------------------------------------------------------------  Cardiac Enzymes No results for input(s): TROPONINI in the last 168 hours. ------------------------------------------------------------------------------------------------------------------  RADIOLOGY:  No results found.   ASSESSMENT AND PLAN:   *Foley catheter related UTI Catheter changed on 11/13.  On IV ceftriaxone. Urine culture growing mixed organisms  neg blood cultures. One more day in the hospital atleast till insurance auth comes  * History of coronary artery disease Continue aspirin, statin therapy  * Asthma.  Stable.  Continue nebulizers as needed.  * chronic neurogenic bladder Foley catheter in place  * Acute hypoxic resp failure - CXR showed atelectesis and mild effusion - continue lasix & nebs   All the records are reviewed and case discussed with Care  Management/Social Worker Management plans discussed with the patient, family (D/W daughter [name] @ [phone]) and they are in agreement.  CODE STATUS: FULL CODE  DVT Prophylaxis: SCDs  TOTAL TIME TAKING CARE OF THIS PATIENT: 35 minutes.   POSSIBLE D/C IN 1 DAYS, DEPENDING ON CLINICAL CONDITION once insurance auth received  Max Sane M.D on 10/22/2017 at 12:07 PM  Between 7am to 6pm - Pager - [phone]  After 6pm go to [email or web address] - password [email or web address] [facility]  Office  [phone]  CC: Primary care physician; [name], MD  Note: This dictation was prepared with Dragon dictation along with smaller phrase technology. Any transcriptional errors that result from this process are unintentional.

## 2017-10-22 NOTE — Progress Notes (Signed)
Report called to [facility]. EMS in [place] called for transport.

## 2017-10-22 NOTE — Progress Notes (Signed)
VSS. Pt in no acute distress. Per MD the patient can get Xanax from the provider at the facility. IV removed and Pt transported by [facility].

## 2017-10-22 NOTE — Clinical Social Work Note (Signed)
CSW reviewed via chart review that this patient is set for discharge to SNF. CSW will assess and attempt for work up today. The patient's insurance is such that authorization will not occur today. CSW will attempt to gain an LOG for coverage while insurance authorization is pending.   Santiago Bumpers, MSW, Latanya Presser [phone]

## 2017-10-22 NOTE — Clinical Social Work Note (Signed)
The patient will discharge to [facility] in [place] with a 5-day LOG. The facility has received all paperwork, and the patient's daughter is aware and in agreement. The CSW will deliver the packet to the chart for the nurse to call report to [phone] and for EMS to transport. CSW will continue to follow pending additional needs.  Santiago Bumpers, MSW, Latanya Presser [phone]

## 2017-10-22 NOTE — Discharge Instructions (Signed)
Urinary Tract Infection, Adult °A urinary tract infection (UTI) is an infection of any part of the urinary tract. The urinary tract includes the: °· Kidneys. °· Ureters. °· Bladder. °· Urethra. ° °These organs make, store, and get rid of pee (urine) in the body. °Follow these instructions at home: °· Take over-the-counter and prescription medicines only as told by your doctor. °· If you were prescribed an antibiotic medicine, take it as told by your doctor. Do not stop taking the antibiotic even if you start to feel better. °· Avoid the following drinks: °? Alcohol. °? Caffeine. °? Tea. °? Carbonated drinks. °· Drink enough fluid to keep your pee clear or pale yellow. °· Keep all follow-up visits as told by your doctor. This is important. °· Make sure to: °? Empty your bladder often and completely. Do not to hold pee for long periods of time. °? Empty your bladder before and after sex. °? Wipe from front to back after a bowel movement if you are male. Use each tissue one time when you wipe. °Contact a doctor if: °· You have back pain. °· You have a fever. °· You feel sick to your stomach (nauseous). °· You throw up (vomit). °· Your symptoms do not get better after 3 days. °· Your symptoms go away and then come back. °Get help right away if: °· You have very bad back pain. °· You have very bad lower belly (abdominal) pain. °· You are throwing up and cannot keep down any medicines or water. °This information is not intended to replace advice given to you by your health care provider. Make sure you discuss any questions you have with your health care provider. °Document Released: 05/02/2008 Document Revised: 04/21/2016 Document Reviewed: 10/05/2015 °Elsevier Interactive Patient Education © 2018 Elsevier Inc. ° °

## 2017-10-22 NOTE — Clinical Social Work Note (Addendum)
CSW has contacted [facility] about possible admission. They are not able to receive the patient with an LOG. The CSW has contacted [facility]. The weekend supervisor is contacting the director of nursing and will call back to advise whether the facility can accept today with an LOG.   12:20 PM: [facility] is unable to accept LOG. They can accept him once his insurance Josem Kaufmann is finalized. [facility] is unable to accept LOG, but they can accept him once his insurance Josem Kaufmann is finalized.   CSW spoke with the patient's daughter, [name], who has given permission to expand the bed search into [place]. The CSW will begin calling the facilities to inquire about ability to receive with an LOG and update the family when able.  1:17 PM: CSW left HIPPA compliant voicemail for [place], admissions coordinator for [facility] and [facility]. Waiting for call back.  1:20 PM: CSW spoke with Solmon Ice with [facility] and [facility]. She stated that the facility will not accept an LOG at this time.  1:27 PM: CSW spoke with Abigail Butts, admissions coordinator from [facility]. She is going to contact her administrator to inquire about accepting LOG and return a call to the [facility].  1:49 PM: CSW received call back from [facility]. She is able to accept him today on a 5 day LOG. The patient's daughter is aware and is going to contact Abigail Butts to discuss needs on their end prior to making final decision.  CSW is continuing to follow.  Santiago Bumpers, MSW, Latanya Presser [phone]

## 2017-10-22 NOTE — Discharge Summary (Addendum)
[facility] [place] NAME: [name]    MR#:  [id number]  DATE OF BIRTH:  11-02-1934  DATE OF ADMISSION:  10/18/2017   ADMITTING PHYSICIAN: Gorden Harms, MD  DATE OF DISCHARGE: 10/22/2017  PRIMARY CARE PHYSICIAN: [name], MD   ADMISSION DIAGNOSIS:  Sepsis, due to unspecified organism ([place]) [A41.9] Urinary tract infection without hematuria, site unspecified [N39.0] DISCHARGE DIAGNOSIS:  Active Problems:   Complicated UTI (urinary tract infection)  SECONDARY DIAGNOSIS:   Past Medical History:  Diagnosis Date  . Allergy   . Anemia   . Anxiety   . Arthritis    rheumatoid  . Asthma   . CAD (coronary artery disease)   . Cancer [facility])    Prostate  . Chronic sinusitis   . Depression   . Diverticulitis   . ED (erectile dysfunction)   . GERD (gastroesophageal reflux disease)   . History of SIADH   . Hx of adenomatous colonic polyps   . Hypertension   . Hyponatremia   . IBS (irritable bowel syndrome)   . Neurodermatitis   . Neurogenic bladder   . OSA (obstructive sleep apnea)    HOSPITAL COURSE:  83 y.o. male with a known history of chronic indwelling Foley for neurogenic bladder admitted for acute recurrent complicated urinary tract infection.  *Foley catheter related UTI Catheter changed on 10/20/2017 -Treated with IV antibiotic while in the hospital and now being discharged on oral Keflex to finish total 7-day course  * History of coronary artery disease Continue aspirin, statin therapy  *Asthma.  Stable  * chronic neurogenic bladder Foley catheter in place  * Acute hypoxic resp failure Now resolved  *History of sleep apnea: CPAP as needed  * Chronic hyponatremia Stable DISCHARGE CONDITIONS:  stable CONSULTS OBTAINED:   DRUG ALLERGIES:   Allergies  Allergen Reactions  . Ciprofloxacin Nausea And Vomiting    Headache  . Citalopram Hydrobromide Other (See Comments)    "unknown"  .  Lorazepam     Adverse reaction  . Paroxetine Nausea Only  . Ramipril Other (See Comments)    "unknown"  . Simvastatin    DISCHARGE MEDICATIONS:   Allergies as of 10/22/2017      Reactions   Ciprofloxacin Nausea And Vomiting   Headache   Citalopram Hydrobromide Other (See Comments)   "unknown"   Lorazepam    Adverse reaction   Paroxetine Nausea Only   Ramipril Other (See Comments)   "unknown"   Simvastatin       Medication List    TAKE these medications   albuterol 108 (90 Base) MCG/ACT inhaler Commonly known as:  PROVENTIL HFA;VENTOLIN HFA Inhale 2 puffs into the lungs every 6 (six) hours as needed for wheezing or shortness of breath.   ALIGN 4 MG Caps Take 1 capsule (4 mg total) by mouth daily.   ALPRAZolam 0.5 MG tablet Commonly known as:  XANAX Take 1 tablet (0.5 mg total) by mouth 3 (three) times daily as needed for anxiety or sleep. What changed:    reasons to take this  additional instructions   aspirin 81 MG tablet Take 81 mg by mouth daily.   cephALEXin 250 MG capsule Commonly known as:  KEFLEX Take 1 capsule (250 mg total) by mouth 4 (four) times daily for 10 days.   Co Q 10 100 MG Caps Take 1 capsule by mouth daily.   Cranberry 425 MG Caps Take 2 capsules by mouth daily.  fluticasone 50 MCG/ACT nasal spray Commonly known as:  FLONASE Place 2 sprays into both nostrils 2 (two) times daily.   folic acid 1 MG tablet Commonly known as:  FOLVITE Take 1 tablet (1 mg total) by mouth daily.   furosemide 20 MG tablet Commonly known as:  LASIX TAKE 1 TABLET BY MOUTH EVERY DAY What changed:    how much to take  how to take this  when to take this   gabapentin 300 MG capsule Commonly known as:  NEURONTIN TAKE 1 CAPSULE BY MOUTH THREE TIMES DAILY   hydroxychloroquine 200 MG tablet Commonly known as:  PLAQUENIL Take 1 tablet by mouth 2 (two) times daily.   methotrexate 2.5 MG tablet Commonly known as:  RHEUMATREX Take 10 tablets by mouth  once a week.   Milk Thistle 250 MG Caps Take 1 capsule by mouth daily.   multivitamin tablet Take 1 tablet by mouth daily.   omeprazole 20 MG capsule Commonly known as:  PRILOSEC TAKE 1 CAPSULE BY MOUTH EVERY DAY What changed:    how much to take  how to take this  when to take this   polyethylene glycol packet Commonly known as:  MIRALAX / GLYCOLAX Take 34 g by mouth daily.   pravastatin 20 MG tablet Commonly known as:  PRAVACHOL TAKE 1 TABLET BY MOUTH EVERY DAY   Selenium 200 MCG Caps Take 1 capsule by mouth daily.   solifenacin 5 MG tablet Commonly known as:  VESICARE Take 5 mg by mouth at bedtime. For bladder spasms   traMADol 50 MG tablet Commonly known as:  ULTRAM Take 1 tablet (50 mg total) 4 (four) times daily as needed by mouth.   Turmeric 500 MG Caps Take 500 mg by mouth daily.   vitamin B-12 500 MCG tablet Commonly known as:  CYANOCOBALAMIN Take 500 mcg by mouth daily.   Vitamin D3 5000 units Caps Take 1 capsule by mouth daily.        DISCHARGE INSTRUCTIONS:   DIET:  Regular diet DISCHARGE CONDITION:  Good ACTIVITY:  Activity as tolerated OXYGEN:  Home Oxygen: No.  Oxygen Delivery: room air DISCHARGE LOCATION:  nursing home   If you experience worsening of your admission symptoms, develop shortness of breath, life threatening emergency, suicidal or homicidal thoughts you must seek medical attention immediately by calling 911 or calling your MD immediately  if symptoms less severe.  You Must read complete instructions/literature along with all the possible adverse reactions/side effects for all the Medicines you take and that have been prescribed to you. Take any new Medicines after you have completely understood and accpet all the possible adverse reactions/side effects.   Please note  You were cared for by a hospitalist during your hospital stay. If you have any questions about your discharge medications or the care you received  while you were in the hospital after you are discharged, you can call the unit and asked to speak with the hospitalist on call if the hospitalist that took care of you is not available. Once you are discharged, your primary care physician will handle any further medical issues. Please note that NO REFILLS for any discharge medications will be authorized once you are discharged, as it is imperative that you return to your primary care physician (or establish a relationship with a primary care physician if you do not have one) for your aftercare needs so that they can reassess your need for medications and monitor your lab values.  On the day of Discharge:  VITAL SIGNS:  Blood pressure (!) 125/53, pulse 74, temperature 98.6 F (37 C), temperature source Oral, resp. rate 20, height 5\' 11"  (1.803 m), weight 100.1 kg (220 lb 9.6 oz), SpO2 95 %. PHYSICAL EXAMINATION:  GENERAL:  83 y.o.-year-old patient lying in the bed with no acute distress.  EYES: Pupils equal, round, reactive to light and accommodation. No scleral icterus. Extraocular muscles intact.  HEENT: Head atraumatic, normocephalic. Oropharynx and nasopharynx clear.  NECK:  Supple, no jugular venous distention. No thyroid enlargement, no tenderness.  LUNGS: Normal breath sounds bilaterally, no wheezing, rales,rhonchi or crepitation. No use of accessory muscles of respiration.  CARDIOVASCULAR: S1, S2 normal. No murmurs, rubs, or gallops.  ABDOMEN: Soft, non-tender, non-distended. Bowel sounds present. No organomegaly or mass.  EXTREMITIES: No pedal edema, cyanosis, or clubbing.  NEUROLOGIC: Cranial nerves II through XII are intact. Muscle strength 5/5 in all extremities. Sensation intact. Gait not checked.  PSYCHIATRIC: The patient is alert and oriented x 3.  SKIN: No obvious rash, lesion, or ulcer.  DATA REVIEW:   CBC Recent Labs  Lab 10/20/17 0307  WBC 11.2*  HGB 11.8*  HCT 34.3*  PLT 114*    Chemistries  Recent Labs  Lab  10/21/17 0324  NA 131*  K 3.3*  CL 94*  CO2 30  GLUCOSE 113*  BUN 20  CREATININE 0.70  CALCIUM 8.0*    Follow-up Information    Viviana Simpler I, MD. Schedule an appointment as soon as possible for a visit in 1 week(s).   Specialties:  Internal Medicine, Pediatrics Contact information: [place] [address] [phone]        Murrell Redden, MD. Schedule an appointment as soon as possible for a visit in 3 week(s).   Specialty:  Urology Contact information: [address] [place] [address] [phone]           Management plans discussed with the patient, family (discussed with daughter Maysin Carstens at [phone]) and they are in agreement.  CODE STATUS: Full Code   TOTAL TIME TAKING CARE OF THIS PATIENT: 45 minutes.    Max Sane M.D on 10/22/2017 at 7:51 AM  Between 7am to 6pm - Pager - [phone]  After 6pm go to [email or web address] - Proofreader  [facility]  Office  [phone]  CC: Primary care physician; [name], MD   Note: This dictation was prepared with Dragon dictation along with smaller phrase technology. Any transcriptional errors that result from this process are unintentional.

## 2017-10-22 NOTE — NC FL2 (Signed)
[place] LEVEL OF CARE SCREENING TOOL     IDENTIFICATION  Patient Name: [name] Birthdate: May 29, 1934 Sex: male Admission Date (Current Location): 10/18/2017  Mariano Colan and [place] Number:  Engineering geologist and Address:  [facility], [address] [place], [place] [address]      Provider Number: [id number]  Attending Physician Name and Address:  Max Sane, MD  Relative Name and Phone Number:  [name] (Daughter) [phone]    Current Level of Care: Hospital Recommended Level of Care: [facility] Prior Approval Number:    Date Approved/Denied:   PASRR Number: [id number]  Discharge Plan: SNF    Current Diagnoses: Patient Active Problem List   Diagnosis Date Noted  . Complicated UTI (urinary tract infection) 10/18/2017  . SIADH (syndrome of inappropriate ADH production) ([place]) 04/17/2017  . Hyponatremia 01/16/2017  . Asthma with acute exacerbation 11/14/2016  . Mood disorder ([place]) 01/18/2016  . Presence of indwelling urinary catheter 08/20/2015  . Advanced directives, counseling/discussion 07/28/2014  . Routine general medical examination at a health care facility 04/09/2012  . History of prostate cancer 04/09/2012  . Venous stasis dermatitis 06/13/2011  . Chronic sinusitis   . CONSTIPATION, CHRONIC 12/07/2010  . Hyperlipemia 12/02/2010  . OSTEOARTHRITIS 12/02/2010  . NEUROGENIC BLADDER 05/24/2010  . IBS 05/05/2010  . PERSONAL HISTORY OF COLONIC POLYPS 05/05/2010  . NEUROPATHY 05/03/2010  . Essential hypertension, benign 03/25/2009  . GAD (generalized anxiety disorder) 01/27/2007  . Coronary atherosclerosis of native coronary artery 01/27/2007  . Allergic asthma 01/27/2007  . GERD 01/27/2007  . Chronic rheumatic arthritis ([place]) 01/27/2007    Orientation RESPIRATION BLADDER Height & Weight     Self, Time, Situation, Place  Normal Continent Weight: 220 lb 9.6 oz (100.1 kg) Height:  5\' 11"   (180.3 cm)  BEHAVIORAL SYMPTOMS/MOOD NEUROLOGICAL BOWEL NUTRITION STATUS      Continent Diet(Heart healthy)  AMBULATORY STATUS COMMUNICATION OF NEEDS Skin   Extensive Assist Verbally Normal                       Personal Care Assistance Level of Assistance  Bathing, Feeding, Dressing Bathing Assistance: Limited assistance Feeding assistance: Independent Dressing Assistance: Limited assistance     Functional Limitations Info             SPECIAL CARE FACTORS FREQUENCY  PT (By licensed PT)     PT Frequency: Up to 5X per day, 5 days per week              Contractures Contractures Info: Not present    Additional Factors Info  Code Status, Allergies, Psychotropic Code Status Info: Full Allergies Info: Ciprofloxacin, Citalopram Hydrobromide, Lorazepam, Paroxetine, Ramipril, Simvastatin Psychotropic Info: Xanax, Gabapentin         Current Medications (10/22/2017):  This is the current hospital active medication list Current Facility-Administered Medications  Medication Dose Route Frequency Provider Last Rate Last Dose  . acetaminophen (TYLENOL) tablet 650 mg  650 mg Oral Q6H PRN Salary, Montell D, MD   650 mg at 10/19/17 1954   Or  . acetaminophen (TYLENOL) suppository 650 mg  650 mg Rectal Q6H PRN Salary, Montell D, MD      . acidophilus (RISAQUAD) capsule 1 capsule  1 capsule Oral Daily Salary, Montell D, MD   1 capsule at 10/22/17 0848  . albuterol (PROVENTIL) (2.5 MG/3ML) 0.083% nebulizer solution 2.5 mg  2.5 mg Inhalation Q6H PRN Salary, Avel Peace, MD      .  ALPRAZolam Duanne Moron) tablet 0.5 mg  0.5 mg Oral TID PRN Salary, Holly Bodily D, MD   0.5 mg at 10/21/17 2110  . aspirin EC tablet 81 mg  81 mg Oral Daily Salary, Montell D, MD   81 mg at 10/22/17 0847  . cefTRIAXone (ROCEPHIN) 2 g in dextrose 5 % 50 mL IVPB  2 g Intravenous Q24H Salary, Montell D, MD 100 mL/hr at 10/21/17 1630 2 g at 10/21/17 1630  . cholecalciferol (VITAMIN D) tablet 5,000 Units  5,000 Units Oral  Daily Salary, Montell D, MD   5,000 Units at 10/19/17 1003  . cyanocobalamin tablet 500 mcg  500 mcg Oral Daily Salary, Montell D, MD      . darifenacin (ENABLEX) 24 hr tablet 7.5 mg  7.5 mg Oral Daily Salary, Montell D, MD   7.5 mg at 10/22/17 0848  . enoxaparin (LOVENOX) injection 40 mg  40 mg Subcutaneous Q24H Salary, Montell D, MD   40 mg at 56/38/75 6433  . folic acid (FOLVITE) tablet 1 mg  1 mg Oral Daily Salary, Montell D, MD   1 mg at 10/22/17 0850  . furosemide (LASIX) tablet 40 mg  40 mg Oral Daily Hillary Bow, MD   40 mg at 10/22/17 0849  . gabapentin (NEURONTIN) capsule 300 mg  300 mg Oral TID Loney Hering D, MD   300 mg at 10/22/17 0846  . HYDROcodone-acetaminophen (NORCO/VICODIN) 5-325 MG per tablet 1-2 tablet  1-2 tablet Oral Q4H PRN Salary, Montell D, MD      . hydroxychloroquine (PLAQUENIL) tablet 200 mg  200 mg Oral BID Salary, Montell D, MD   200 mg at 10/22/17 0846  . multivitamin with minerals tablet 1 tablet  1 tablet Oral Daily Salary, Montell D, MD   1 tablet at 10/19/17 1003  . ondansetron (ZOFRAN) tablet 4 mg  4 mg Oral Q6H PRN Salary, Montell D, MD       Or  . ondansetron (ZOFRAN) injection 4 mg  4 mg Intravenous Q6H PRN Salary, Montell D, MD      . polyethylene glycol (MIRALAX / GLYCOLAX) packet 17 g  17 g Oral Daily PRN Salary, Montell D, MD      . polyethylene glycol (MIRALAX / GLYCOLAX) packet 34 g  34 g Oral Daily Salary, Montell D, MD      . pravastatin (PRAVACHOL) tablet 20 mg  20 mg Oral Daily Salary, Montell D, MD   20 mg at 10/21/17 2112     Discharge Medications: Please see discharge summary for a list of discharge medications.  Relevant Imaging Results:  Relevant Lab Results:   Additional Information SS# [id number]  Zettie Pho, LCSW

## 2017-10-22 NOTE — Clinical Social Work Note (Signed)
Clinical Social Work Assessment  Patient Details  Name: [name] MRN: [id number] Date of Birth: 18-Sep-1934  Date of referral:  10/22/17               Reason for consult:  Facility Placement                Permission sought to share information with:  Facility Art therapist granted to share information::  Yes, Verbal Permission Granted  Name::        Agency::     Relationship::     Contact Information:     Housing/Transportation Living arrangements for the past 2 months:  Single Family Home Source of Information:  Patient, Medical Team Patient Interpreter Needed:  None Criminal Activity/Legal Involvement Pertinent to Current Situation/Hospitalization:  No - Comment as needed Significant Relationships:  Adult Children, Community Support Lives with:  Self Do you feel safe going back to the place where you live?  Yes Need for family participation in patient care:  No (Coment)  Care giving concerns:  PT recommendation for STR   Social Worker assessment / plan:  CSW met with the patient at bedside to discuss discharge planning. The patient gave verbal permission to conduct a bed search, and he named Materials engineer as his primary choice and [place] as a Teacher, music. The CSW advised the patient that his insurance authorization could be a barrier, and the patient verbalized understanding.  The CSW contacted Sharyn Lull, admissions coordinator from [facility] to inquire about an imminent discharge to a male bed. She is not able to accept this patient and will not accept an LOG.  CSW contacted [place] about a 5-day LOG to cover for insurance authorization timing to minimize length of stay. Zack confirmed LOG. CSW has updated him as to the patient's specificity of placement as a barrier. CSW director is following.  Employment status:  Retired Nurse, adult PT Recommendations:  [place] /  Referral to community resources:  [place]  Patient/Family's Response to care: Patient thanked the CSW for assistance.  Patient/Family's Understanding of and Emotional Response to Diagnosis, Current Treatment, and Prognosis:  The patient understands the PT recommendation and is in agreement. The patient has vehemently refused any placement outside of the two indicated.  Emotional Assessment Appearance:  Appears stated age Attitude/Demeanor/Rapport:  (Pleasant and cooperative) Affect (typically observed):  Appropriate, Pleasant Orientation:  Oriented to Self, Oriented to Place, Oriented to  Time, Oriented to Situation Alcohol / Substance use:  Never Used Psych involvement (Current and /or in the community):  No (Comment)  Discharge Needs  Concerns to be addressed:  Care Coordination, Discharge Planning Concerns Readmission within the last 30 days:  No Current discharge risk:  None Barriers to Discharge:  Continued Medical Work up, No SNF bed   Zettie Pho, LCSW 10/22/2017, 9:12 AM

## 2017-10-23 ENCOUNTER — Ambulatory Visit: Payer: Medicare PPO

## 2017-10-23 ENCOUNTER — Telehealth: Payer: Self-pay | Admitting: *Deleted

## 2017-10-23 LAB — CULTURE, BLOOD (ROUTINE X 2)
CULTURE: NO GROWTH
Culture: NO GROWTH
SPECIAL REQUESTS: ADEQUATE
SPECIAL REQUESTS: ADEQUATE

## 2017-10-23 NOTE — Telephone Encounter (Signed)
He should have follow up within a week or so of leaving rehab

## 2017-10-23 NOTE — Telephone Encounter (Signed)
He was discharged yesterday. I tried to call the room before he left but the line was busy. Please check on him---I already sent you the discharge summary

## 2017-10-23 NOTE — Telephone Encounter (Signed)
Spoke to pt. He is in [facility] in [employer]. Not very happy with that placement. Still very weak. Working on getting strength back. Said PT is pretty good there. Cannot keep the visit on 10-27-17. I have cancelled that appt.

## 2017-10-23 NOTE — Telephone Encounter (Signed)
Copied from [place]. Topic: Inquiry >> Oct 20, 2017  3:35 PM Corie Chiquito, [place] wrote: Reason for RJJ:OACZYSA daughter wanted to let Dr.Letvak know that her father is in the hospital [facility] room 154 with an UTI.

## 2017-10-24 DIAGNOSIS — N318 Other neuromuscular dysfunction of bladder: Secondary | ICD-10-CM | POA: Diagnosis not present

## 2017-10-24 DIAGNOSIS — I2510 Atherosclerotic heart disease of native coronary artery without angina pectoris: Secondary | ICD-10-CM | POA: Diagnosis not present

## 2017-10-24 DIAGNOSIS — M069 Rheumatoid arthritis, unspecified: Secondary | ICD-10-CM | POA: Diagnosis not present

## 2017-10-24 DIAGNOSIS — N390 Urinary tract infection, site not specified: Secondary | ICD-10-CM | POA: Diagnosis not present

## 2017-10-25 ENCOUNTER — Ambulatory Visit: Payer: Medicare PPO | Admitting: Pulmonary Disease

## 2017-10-27 ENCOUNTER — Ambulatory Visit: Payer: Medicare PPO | Admitting: Internal Medicine

## 2017-10-30 ENCOUNTER — Telehealth: Payer: Self-pay | Admitting: *Deleted

## 2017-10-30 NOTE — Telephone Encounter (Signed)
Okay to use same day on Wednesday or Thursday

## 2017-10-30 NOTE — Telephone Encounter (Signed)
Patient scheduled appointment on 11/02/17 at 2:00 and will arrive at 1:45.

## 2017-10-30 NOTE — Telephone Encounter (Signed)
Copied from [place]. Topic: Inquiry >> Oct 30, 2017 12:51 PM Pricilla Handler wrote: Reason for CRM: Patient called wanting to see Dr. Silvio Pate for a post hospital follow up and Rehab Follow Up. Patient stated that he can only be seen Weds, Thurs, or Fri afternoon this week only. Home Health wants the patient to see Dr. Silvio Pate ASAP. Patient stated that he has been a patient of Dr. Silvio Pate for years, and needs an appt this week, but only during the times he is available. Patient wants Dr. Silvio Pate to call him Today before 4pm at [phone]. Thank You!!!

## 2017-10-31 ENCOUNTER — Ambulatory Visit (INDEPENDENT_AMBULATORY_CARE_PROVIDER_SITE_OTHER): Payer: Medicare PPO

## 2017-10-31 DIAGNOSIS — J4540 Moderate persistent asthma, uncomplicated: Secondary | ICD-10-CM | POA: Diagnosis not present

## 2017-10-31 NOTE — Telephone Encounter (Signed)
He has an appt this week

## 2017-10-31 NOTE — Telephone Encounter (Signed)
Please sign encounter when completed.

## 2017-11-01 DIAGNOSIS — G8929 Other chronic pain: Secondary | ICD-10-CM | POA: Diagnosis not present

## 2017-11-01 DIAGNOSIS — I2510 Atherosclerotic heart disease of native coronary artery without angina pectoris: Secondary | ICD-10-CM | POA: Diagnosis not present

## 2017-11-01 DIAGNOSIS — J45909 Unspecified asthma, uncomplicated: Secondary | ICD-10-CM | POA: Diagnosis not present

## 2017-11-01 DIAGNOSIS — N319 Neuromuscular dysfunction of bladder, unspecified: Secondary | ICD-10-CM | POA: Diagnosis not present

## 2017-11-01 DIAGNOSIS — F329 Major depressive disorder, single episode, unspecified: Secondary | ICD-10-CM | POA: Diagnosis not present

## 2017-11-01 DIAGNOSIS — F39 Unspecified mood [affective] disorder: Secondary | ICD-10-CM | POA: Diagnosis not present

## 2017-11-01 DIAGNOSIS — I10 Essential (primary) hypertension: Secondary | ICD-10-CM | POA: Diagnosis not present

## 2017-11-01 DIAGNOSIS — T83511D Infection and inflammatory reaction due to indwelling urethral catheter, subsequent encounter: Secondary | ICD-10-CM | POA: Diagnosis not present

## 2017-11-01 DIAGNOSIS — F419 Anxiety disorder, unspecified: Secondary | ICD-10-CM | POA: Diagnosis not present

## 2017-11-01 MED ORDER — OMALIZUMAB 150 MG ~~LOC~~ SOLR
375.0000 mg | SUBCUTANEOUS | Status: DC
  Administered 2017-10-31: 375 mg via SUBCUTANEOUS

## 2017-11-01 NOTE — Progress Notes (Signed)
[name] came in on 10/31/2017 to receive a Xolair injection. The patient is given 375mg  every 14 days. Due to each vial equalling 150mg , 75mg  of medication was wasted.   Documentation of medication administration and charges of Xolair have been completed by Desmond Dike, CMA based on the [place] documentation sheet completed by [facility].

## 2017-11-02 ENCOUNTER — Ambulatory Visit: Payer: Medicare PPO | Admitting: Internal Medicine

## 2017-11-02 ENCOUNTER — Encounter: Payer: Self-pay | Admitting: Internal Medicine

## 2017-11-02 VITALS — BP 112/60 | HR 81 | Temp 97.9°F | Wt 210.0 lb

## 2017-11-02 DIAGNOSIS — Z960 Presence of urogenital implants: Secondary | ICD-10-CM

## 2017-11-02 DIAGNOSIS — E871 Hypo-osmolality and hyponatremia: Secondary | ICD-10-CM | POA: Diagnosis not present

## 2017-11-02 DIAGNOSIS — R509 Fever, unspecified: Secondary | ICD-10-CM | POA: Insufficient documentation

## 2017-11-02 NOTE — Progress Notes (Signed)
Subjective:    Patient ID: [name], male    DOB: [birth year], 83 y.o.   MRN: [id number]  HPI Here with wife for follow up after hospitalization and rehab stay Reviewed all hospital records  [facility] and then [facility] for rehab Unfortunately food was "gross and inedible" but the rehab experience was pretty good  Had UTI diagnosed Fever went up to 103.6! Catheter had been changed 5 days before he got sick No apparent clogging Regular bladder spasms--no change in that  meds are the same Had trouble tolerating the cephalexin he was sent out on  Still on now!  Current Outpatient Medications on File Prior to Visit  Medication Sig Dispense Refill  . albuterol (PROVENTIL HFA;VENTOLIN HFA) 108 (90 Base) MCG/ACT inhaler Inhale 2 puffs into the lungs every 6 (six) hours as needed for wheezing or shortness of breath. 1 Inhaler 3  . ALPRAZolam (XANAX) 0.5 MG tablet Take 1 tablet (0.5 mg total) by mouth 3 (three) times daily as needed for anxiety or sleep. 6 tablet 0  . aspirin 81 MG tablet Take 81 mg by mouth daily.    . Cholecalciferol (VITAMIN D3) 5000 UNITS CAPS Take 1 capsule by mouth daily.    . Coenzyme Q10 (CO Q 10) 100 MG CAPS Take 1 capsule by mouth daily.    . Cranberry 425 MG CAPS Take 2 capsules by mouth daily.    . fluticasone (FLONASE) 50 MCG/ACT nasal spray Place 2 sprays into both nostrils 2 (two) times daily. 48 g 3  . folic acid (FOLVITE) 1 MG tablet Take 1 tablet (1 mg total) by mouth daily. 90 tablet 3  . furosemide (LASIX) 20 MG tablet TAKE 1 TABLET BY MOUTH EVERY DAY (Patient taking differently: TAKE 1 TABLET BY mouth 3 times a week) 30 tablet 11  . gabapentin (NEURONTIN) 300 MG capsule TAKE 1 CAPSULE BY MOUTH THREE TIMES DAILY 90 capsule 11  . hydroxychloroquine (PLAQUENIL) 200 MG tablet Take 1 tablet by mouth 2 (two) times daily.    . methotrexate (RHEUMATREX) 2.5 MG tablet Take 10 tablets by mouth once a week.    . Milk Thistle 250 MG CAPS Take 1 capsule by mouth  daily.    . Multiple Vitamin (MULTIVITAMIN) tablet Take 1 tablet by mouth daily.      Marland Kitchen omeprazole (PRILOSEC) 20 MG capsule TAKE 1 CAPSULE BY MOUTH EVERY DAY (Patient taking differently: TAKE 1 CAPSULE BY MOUTH AS NEEDED) 90 capsule 3  . polyethylene glycol (GLYCOLAX) packet Take 34 g by mouth daily.     . pravastatin (PRAVACHOL) 20 MG tablet TAKE 1 TABLET BY MOUTH EVERY DAY 90 tablet 3  . Probiotic Product (ALIGN) 4 MG CAPS Take 1 capsule (4 mg total) by mouth daily. 15 capsule 0  . Selenium 200 MCG CAPS Take 1 capsule by mouth daily.    . solifenacin (VESICARE) 5 MG tablet Take 5 mg by mouth at bedtime. For bladder spasms    . traMADol (ULTRAM) 50 MG tablet Take 1 tablet (50 mg total) 4 (four) times daily as needed by mouth. 120 tablet 0  . Turmeric 500 MG CAPS Take 500 mg by mouth daily.    . vitamin B-12 (CYANOCOBALAMIN) 500 MCG tablet Take 500 mcg by mouth daily.     Current Facility-Administered Medications on File Prior to Visit  Medication Dose Route Frequency Provider Last Rate Last Dose  . omalizumab Arvid Right) injection 375 mg  375 mg Subcutaneous Q14 Days Chesley Mires, MD  375 mg at 10/31/17 1337    Allergies  Allergen Reactions  . Ciprofloxacin Nausea And Vomiting    Headache  . Citalopram Hydrobromide Other (See Comments)    "unknown"  . Lorazepam     Adverse reaction  . Paroxetine Nausea Only  . Ramipril Other (See Comments)    "unknown"  . Simvastatin     Past Medical History:  Diagnosis Date  . Allergy   . Anemia   . Anxiety   . Arthritis    rheumatoid  . Asthma   . CAD (coronary artery disease)   . Cancer [facility])    Prostate  . Chronic sinusitis   . Depression   . Diverticulitis   . ED (erectile dysfunction)   . GERD (gastroesophageal reflux disease)   . History of SIADH   . Hx of adenomatous colonic polyps   . Hypertension   . Hyponatremia   . IBS (irritable bowel syndrome)   . Neurodermatitis   . Neurogenic bladder   . OSA (obstructive sleep  apnea)     Past Surgical History:  Procedure Laterality Date  . KNEE ARTHROPLASTY Right 09/02/2015   Procedure: COMPUTER ASSISTED TOTAL KNEE ARTHROPLASTY;  Surgeon: [name], MD;  Location: [facility] ORS;  Service: Orthopedics;  Laterality: Right;  . KNEE SURGERY Left    ARTHROSCOPY LEFT  . NASAL SINUS SURGERY  2009   DEVIATED SEPTUM AND POLYPS  . PROSTATE SURGERY     PROSTATECTOMY  . TOTAL KNEE ARTHROPLASTY Left 9/15   Dr [name]  . URETHRAL STRICTURE DILATATION  02-2010   Dr.Cope    Family History  Problem Relation Age of Onset  . Heart disease Mother 12       heart failure  . Pneumonia Father 4       pnemonia  . Colon cancer Neg Hx     Social History   Socioeconomic History  . Marital status: Married    Spouse name: Not on file  . Number of children: 2  . Years of education: Not on file  . Highest education level: Not on file  Social Needs  . Financial resource strain: Not on file  . Food insecurity - worry: Not on file  . Food insecurity - inability: Not on file  . Transportation needs - medical: Not on file  . Transportation needs - non-medical: Not on file  Occupational History  . Occupation: Radio Licensed conveyancer    Comment: retired  Tobacco Use  . Smoking status: Never Smoker  . Smokeless tobacco: Never Used  Substance and Sexual Activity  . Alcohol use: Yes    Alcohol/week: 4.8 oz    Types: 8 Standard drinks or equivalent per week  . Drug use: No  . Sexual activity: Not on file  Other Topics Concern  . Not on file  Social History Narrative   Not sure about a living will or health care POA   Wife should make health care decisions for him--then daughter, then son   Would accept resuscitation attempts   Not sure about tube feeds   Review of Systems No cough or SOB with the fever No skin issues or infection Missed xolair while in the hospital ---did just get it 2 days ago Appetite is back Walking with walker again Constipated again even with  miralax--some help with prunes Had skin cancer removed from left neck by Dr Kellie Moor    Objective:   Physical Exam  Constitutional: He appears well-developed. No distress.  Neck:  No thyromegaly present.  Cardiovascular: Normal rate, regular rhythm and normal heart sounds. Exam reveals no gallop.  No murmur heard. Pulmonary/Chest: Effort normal and breath sounds normal. No respiratory distress. He has no wheezes. He has no rales.  Abdominal: Soft. There is no tenderness.  Musculoskeletal: He exhibits no edema.  Lymphadenopathy:    He has no cervical adenopathy.  Psychiatric:  Typical anxiety about his health          Assessment & Plan:

## 2017-11-02 NOTE — Assessment & Plan Note (Signed)
Last 131 Taking the lasix 3 times a week

## 2017-11-02 NOTE — Assessment & Plan Note (Signed)
Up to 103.7  Urine culture was negative so unclear if this was the problem Dr [name] also was unconvinced May have had other viral infection Urged him to stop the antibiotic (I am surprised he was still on it)

## 2017-11-02 NOTE — Assessment & Plan Note (Signed)
Fever came 5 days after catheter change I don't think this was the problem

## 2017-11-03 ENCOUNTER — Telehealth: Payer: Self-pay | Admitting: Internal Medicine

## 2017-11-03 NOTE — Telephone Encounter (Signed)
That is fine 

## 2017-11-03 NOTE — Telephone Encounter (Signed)
Copied from [place]. Topic: Quick Communication - See Telephone Encounter >> Nov 03, 2017  4:15 PM Arletha Grippe wrote: CRM for notification. See Telephone encounter for:   11/03/17.aldren from [facility] home health [facility]  asking for verbal orders for  - physical therapy 2 week 5 [phone]

## 2017-11-08 NOTE — Telephone Encounter (Signed)
Spoke to [facility] and provided verbal orders for PT 2x/wk for 5weeks

## 2017-11-10 ENCOUNTER — Telehealth: Payer: Self-pay

## 2017-11-10 NOTE — Telephone Encounter (Signed)
Patient's wife says he does not need to be seen because Dr. Silvio Pate has prescribed this powder in the past.  They would like for this message to be forwarded to Dr. Silvio Pate for Monday.

## 2017-11-10 NOTE — Telephone Encounter (Signed)
Copied from [place] [phone]. Topic: General - Other >> Nov 10, 2017  2:01 PM Marin Olp L wrote: Reason for CRM: Patient calling b/c he has sores on the inside of his legs which he believes is from his indwelling catheter. Would like to know if Dr. Silvio Pate can call in Niastatin powder or similar. He uses [facility] [place], [place]

## 2017-11-10 NOTE — Telephone Encounter (Signed)
Pt needs to be seen to determine cause of irritation.. Try sat clinic

## 2017-11-11 NOTE — Telephone Encounter (Signed)
I don't see nystatin powder on his list, but I am fine with sending Rx for this (#1 container x 2) for him to try. Does have chronic moisture and likely some yeast. If this doesn't help, then he will need to be checked

## 2017-11-13 MED ORDER — NYSTATIN 100000 UNIT/GM EX POWD: Freq: Four times a day (QID) | CUTANEOUS | 2 refills | Status: DC

## 2017-11-13 NOTE — Telephone Encounter (Signed)
Spoke to pt

## 2017-11-14 ENCOUNTER — Telehealth: Payer: Self-pay | Admitting: Pulmonary Disease

## 2017-11-14 ENCOUNTER — Telehealth: Payer: Self-pay | Admitting: *Deleted

## 2017-11-14 NOTE — Telephone Encounter (Signed)
Spoke to [place] and provided verbal orders

## 2017-11-14 NOTE — Telephone Encounter (Signed)
Copied from [place]. Topic: General - Other >> Nov 14, 2017 11:18 AM Bea Graff, [place] wrote: Reason for CRM: Alyse Low from [facility] calling needing verbal order for pt to have social work. CB#: [phone]

## 2017-11-14 NOTE — Telephone Encounter (Signed)
Okay to have Education officer, museum see him

## 2017-11-15 ENCOUNTER — Telehealth: Payer: Self-pay | Admitting: Pulmonary Disease

## 2017-11-16 ENCOUNTER — Telehealth: Payer: Self-pay | Admitting: Pulmonary Disease

## 2017-11-16 ENCOUNTER — Ambulatory Visit: Payer: Medicare PPO

## 2017-11-16 DIAGNOSIS — N319 Neuromuscular dysfunction of bladder, unspecified: Secondary | ICD-10-CM | POA: Diagnosis not present

## 2017-11-16 NOTE — Telephone Encounter (Signed)
#   vials:6 Ordered date:11/14/17/ Shipping Date:11/14/17 I was off. [name] order these late in the day, and there was an emergency staff meeting.

## 2017-11-16 NOTE — Telephone Encounter (Deleted)
#   vials:6 Ordered date:11/14/18 Shipping Date:11/14/18

## 2017-11-16 NOTE — Telephone Encounter (Signed)
Created in error

## 2017-11-16 NOTE — Telephone Encounter (Signed)
error 

## 2017-11-16 NOTE — Telephone Encounter (Signed)
Error

## 2017-11-16 NOTE — Telephone Encounter (Signed)
#   Vials:6 Arrival Date:11/15/17 Lot #:[id number] Exp Date:02/2021 Order wasn't put in.

## 2017-11-17 ENCOUNTER — Ambulatory Visit (INDEPENDENT_AMBULATORY_CARE_PROVIDER_SITE_OTHER): Payer: Medicare PPO

## 2017-11-17 DIAGNOSIS — J4550 Severe persistent asthma, uncomplicated: Secondary | ICD-10-CM

## 2017-11-22 ENCOUNTER — Telehealth: Payer: Self-pay | Admitting: Internal Medicine

## 2017-11-22 MED ORDER — OMALIZUMAB 150 MG ~~LOC~~ SOLR
375.0000 mg | Freq: Once | SUBCUTANEOUS | Status: AC
Start: 2017-11-17 — End: 2017-11-17
  Administered 2017-11-17: 375 mg via SUBCUTANEOUS

## 2017-11-22 NOTE — Telephone Encounter (Signed)
Spoke to [facility]

## 2017-11-22 NOTE — Progress Notes (Signed)
[name] came in on 11/17/17 to receive a Xolair injection. The patient is given 375mg  every 14 days. Due to each vial equalling 150mg , 15mg  of medication was wasted. Blair Hailey

## 2017-11-22 NOTE — Telephone Encounter (Signed)
Copied from [place] [phone]. Topic: Inquiry >> Nov 22, 2017 11:47 AM Corie Chiquito, [place] wrote: Reason for CRM: Elane Fritz called because she would like for Dr.Latvak to know that the patient missed therapy this week due to the holidays.If someone could give them a call back at [phone]  Usually this type of information doesn't require a call back--but I have noted it and that is fine.

## 2017-11-23 DIAGNOSIS — T83098A Other mechanical complication of other indwelling urethral catheter, initial encounter: Secondary | ICD-10-CM | POA: Diagnosis not present

## 2017-11-24 ENCOUNTER — Other Ambulatory Visit: Payer: Self-pay | Admitting: Internal Medicine

## 2017-11-24 MED ORDER — ALPRAZOLAM 0.5 MG PO TABS: 0.5000 mg | ORAL_TABLET | Freq: Three times a day (TID) | ORAL | 0 refills | Status: DC | PRN

## 2017-11-24 NOTE — Telephone Encounter (Signed)
Copied from [place] [phone]. Topic: Quick Communication - Rx Refill/Question >> Nov 24, 2017  9:26 AM Robina Ade, Helene Kelp D wrote: Has the patient contacted their pharmacy? No (Agent: If no, request that the patient contact the pharmacy for the refill.) Preferred Pharmacy (with phone number or street name): [id number] - [place], [place] Agent: Please be advised that RX refills may take up to 3 business days. We ask that you follow-up with your pharmacy. Patient called and said that he is out of his ALPRAZolam (XANAX) 0.5 MG tablet and needs refill on it.

## 2017-11-24 NOTE — Telephone Encounter (Signed)
Last Rx 09/2017 #6, and was not prescribed by dr Silvio Pate. pls advise

## 2017-11-24 NOTE — Telephone Encounter (Signed)
Approved: #[id number] x 0 

## 2017-11-24 NOTE — Telephone Encounter (Signed)
Left refill on voice mail at pharmacy  

## 2017-12-04 ENCOUNTER — Ambulatory Visit (INDEPENDENT_AMBULATORY_CARE_PROVIDER_SITE_OTHER): Payer: Medicare PPO

## 2017-12-04 ENCOUNTER — Telehealth: Payer: Self-pay

## 2017-12-04 ENCOUNTER — Ambulatory Visit: Payer: Medicare PPO

## 2017-12-04 DIAGNOSIS — J4540 Moderate persistent asthma, uncomplicated: Secondary | ICD-10-CM | POA: Diagnosis not present

## 2017-12-04 NOTE — Telephone Encounter (Signed)
Copied from [place] [phone]. Topic: Inquiry >> Dec 04, 2017  4:45 PM Conception Chancy, [place] wrote: Reason for CRM: [facility] physical therapist is wanting to leave a message for Dr. Silvio Pate patient is not feeling well and could not follow up on therapy today.   [phone]

## 2017-12-04 NOTE — Telephone Encounter (Signed)
Noted May need to see if he has ongoing issues

## 2017-12-12 DIAGNOSIS — M150 Primary generalized (osteo)arthritis: Secondary | ICD-10-CM | POA: Diagnosis not present

## 2017-12-12 DIAGNOSIS — M25511 Pain in right shoulder: Secondary | ICD-10-CM | POA: Diagnosis not present

## 2017-12-12 DIAGNOSIS — M0579 Rheumatoid arthritis with rheumatoid factor of multiple sites without organ or systems involvement: Secondary | ICD-10-CM | POA: Diagnosis not present

## 2017-12-12 DIAGNOSIS — G8929 Other chronic pain: Secondary | ICD-10-CM | POA: Diagnosis not present

## 2017-12-12 DIAGNOSIS — Z79899 Other long term (current) drug therapy: Secondary | ICD-10-CM | POA: Diagnosis not present

## 2017-12-13 ENCOUNTER — Telehealth: Payer: Self-pay | Admitting: Internal Medicine

## 2017-12-13 DIAGNOSIS — E871 Hypo-osmolality and hyponatremia: Secondary | ICD-10-CM | POA: Diagnosis not present

## 2017-12-13 NOTE — Telephone Encounter (Signed)
Orders left on [name]' vm

## 2017-12-13 NOTE — Telephone Encounter (Signed)
That is okay.

## 2017-12-13 NOTE — Telephone Encounter (Signed)
Copied from [place] [phone]. Topic: Quick Communication - See Telephone Encounter >> Dec 13, 2017  8:49 AM [place], Laban Emperor wrote: CRM for notification. See Telephone encounter for:  [name] with [facility] requesting verbal orders for PT 2 week 3.  Contact [phone] 12/13/17.

## 2017-12-14 DIAGNOSIS — R600 Localized edema: Secondary | ICD-10-CM | POA: Diagnosis not present

## 2017-12-14 DIAGNOSIS — E871 Hypo-osmolality and hyponatremia: Secondary | ICD-10-CM | POA: Diagnosis not present

## 2017-12-19 ENCOUNTER — Telehealth: Payer: Self-pay | Admitting: Pulmonary Disease

## 2017-12-19 NOTE — Telephone Encounter (Signed)
#   vials:6 Ordered date:12/19/2017 Shipping Date:12/19/2017

## 2017-12-20 ENCOUNTER — Encounter: Payer: Self-pay | Admitting: Internal Medicine

## 2017-12-20 ENCOUNTER — Ambulatory Visit: Payer: Medicare PPO | Admitting: Internal Medicine

## 2017-12-20 VITALS — BP 110/70 | HR 66 | Temp 97.4°F | Wt 205.0 lb

## 2017-12-20 DIAGNOSIS — K589 Irritable bowel syndrome without diarrhea: Secondary | ICD-10-CM

## 2017-12-20 DIAGNOSIS — F411 Generalized anxiety disorder: Secondary | ICD-10-CM | POA: Diagnosis not present

## 2017-12-20 DIAGNOSIS — F39 Unspecified mood [affective] disorder: Secondary | ICD-10-CM | POA: Diagnosis not present

## 2017-12-20 MED ORDER — OMALIZUMAB 150 MG ~~LOC~~ SOLR
375.0000 mg | Freq: Once | SUBCUTANEOUS | Status: AC
  Administered 2017-12-04: 375 mg via SUBCUTANEOUS

## 2017-12-20 NOTE — Assessment & Plan Note (Addendum)
Seems worse Didn't like buspar Mirtazapine helped appetite but not the anxiety If not better with the counseling---would consider other med (?duloxetine)

## 2017-12-20 NOTE — Progress Notes (Signed)
[name] came in on 12/04/2017 to receive a Xolair injection. The patient is given 375mg  every 14 days. Due to each vial equalling 150mg , 75mg  of medication was wasted.   Documentation of medication administration and charges of Xolair have been completed by Desmond Dike, CMA based on the [place] documentation sheet completed by [facility].

## 2017-12-20 NOTE — Assessment & Plan Note (Signed)
Ongoing anxiety Secondary mild depression  Intolerant of some SSRI Will try new counselor

## 2017-12-20 NOTE — Telephone Encounter (Signed)
#   Vials:6 Arrival Date:12/20/17 Lot #:[id number] Exp Date:02/2021

## 2017-12-20 NOTE — Progress Notes (Signed)
Subjective:    Patient ID: [name], male    DOB: Jun 12, 1934, 84 y.o.   MRN: [id number]  HPI Here due to a couple of concerns--mostly with bowels  Known IBS--- takes miralax up to bid when constipated Stool never well formed--this bothers him Goes better if he eats some prunes  Occasionally takes imodium if loose--then makes him constipated (asked him to avoid this)  "I have so much going on, I think I need to talk to someone" Anxiety is ongoing problem Not really depressed--"But feeling sorry for myself" No suicidal ideation Has seen some counselors --never really connected  Bothered by catheter--still has leakage around this Getting botox in bladder---some help but now insurance not covering it  Current Outpatient Medications on File Prior to Visit  Medication Sig Dispense Refill  . albuterol (PROVENTIL HFA;VENTOLIN HFA) 108 (90 Base) MCG/ACT inhaler Inhale 2 puffs into the lungs every 6 (six) hours as needed for wheezing or shortness of breath. 1 Inhaler 3  . ALPRAZolam (XANAX) 0.5 MG tablet Take 1 tablet (0.5 mg total) by mouth 3 (three) times daily as needed for anxiety or sleep. 90 tablet 0  . aspirin 81 MG tablet Take 81 mg by mouth daily.    . Cholecalciferol (VITAMIN D3) 5000 UNITS CAPS Take 1 capsule by mouth daily.    . Coenzyme Q10 (CO Q 10) 100 MG CAPS Take 1 capsule by mouth daily.    . Cranberry 425 MG CAPS Take 2 capsules by mouth daily.    . fluticasone (FLONASE) 50 MCG/ACT nasal spray Place 2 sprays into both nostrils 2 (two) times daily. 48 g 3  . folic acid (FOLVITE) 1 MG tablet Take 1 tablet (1 mg total) by mouth daily. 90 tablet 3  . furosemide (LASIX) 20 MG tablet TAKE 1 TABLET BY MOUTH EVERY DAY (Patient taking differently: TAKE 1 TABLET BY mouth 3 times a week) 30 tablet 11  . gabapentin (NEURONTIN) 300 MG capsule TAKE 1 CAPSULE BY MOUTH THREE TIMES DAILY 90 capsule 11  . hydroxychloroquine (PLAQUENIL) 200 MG tablet Take 1 tablet by mouth 2 (two) times  daily.    . methotrexate (RHEUMATREX) 2.5 MG tablet Take 10 tablets by mouth once a week.    . Milk Thistle 250 MG CAPS Take 1 capsule by mouth daily.    . Multiple Vitamin (MULTIVITAMIN) tablet Take 1 tablet by mouth daily.      Marland Kitchen nystatin (MYCOSTATIN/NYSTOP) powder Apply topically 4 (four) times daily. 15 g 2  . omeprazole (PRILOSEC) 20 MG capsule TAKE 1 CAPSULE BY MOUTH EVERY DAY (Patient taking differently: TAKE 1 CAPSULE BY MOUTH AS NEEDED) 90 capsule 3  . polyethylene glycol (GLYCOLAX) packet Take 34 g by mouth daily.     . pravastatin (PRAVACHOL) 20 MG tablet TAKE 1 TABLET BY MOUTH EVERY DAY 90 tablet 3  . Probiotic Product (ALIGN) 4 MG CAPS Take 1 capsule (4 mg total) by mouth daily. 15 capsule 0  . Selenium 200 MCG CAPS Take 1 capsule by mouth daily.    . solifenacin (VESICARE) 5 MG tablet Take 5 mg by mouth at bedtime. For bladder spasms    . traMADol (ULTRAM) 50 MG tablet Take 1 tablet (50 mg total) 4 (four) times daily as needed by mouth. 120 tablet 0  . Turmeric 500 MG CAPS Take 500 mg by mouth daily.    . vitamin B-12 (CYANOCOBALAMIN) 500 MCG tablet Take 500 mcg by mouth daily.     Current Facility-Administered  Medications on File Prior to Visit  Medication Dose Route Frequency Provider Last Rate Last Dose  . omalizumab Arvid Right) injection 375 mg  375 mg Subcutaneous Q14 Days Chesley Mires, MD   375 mg at 10/31/17 1337    Allergies  Allergen Reactions  . Ciprofloxacin Nausea And Vomiting    Headache  . Citalopram Hydrobromide Other (See Comments)    "unknown"  . Lorazepam     Adverse reaction  . Paroxetine Nausea Only  . Ramipril Other (See Comments)    "unknown"  . Simvastatin     Past Medical History:  Diagnosis Date  . Allergy   . Anemia   . Anxiety   . Arthritis    rheumatoid  . Asthma   . CAD (coronary artery disease)   . Cancer [facility])    Prostate  . Chronic sinusitis   . Depression   . Diverticulitis   . ED (erectile dysfunction)   . GERD  (gastroesophageal reflux disease)   . History of SIADH   . Hx of adenomatous colonic polyps   . Hypertension   . Hyponatremia   . IBS (irritable bowel syndrome)   . Neurodermatitis   . Neurogenic bladder   . OSA (obstructive sleep apnea)     Past Surgical History:  Procedure Laterality Date  . KNEE ARTHROPLASTY Right 09/02/2015   Procedure: COMPUTER ASSISTED TOTAL KNEE ARTHROPLASTY;  Surgeon: [name], MD;  Location: [facility] ORS;  Service: Orthopedics;  Laterality: Right;  . KNEE SURGERY Left    ARTHROSCOPY LEFT  . NASAL SINUS SURGERY  2009   DEVIATED SEPTUM AND POLYPS  . PROSTATE SURGERY     PROSTATECTOMY  . TOTAL KNEE ARTHROPLASTY Left 9/15   Dr [name]  . URETHRAL STRICTURE DILATATION  02-2010   Dr.Cope    Family History  Problem Relation Age of Onset  . Heart disease Mother 85       heart failure  . Pneumonia Father 51       pnemonia  . Colon cancer Neg Hx     Social History   Socioeconomic History  . Marital status: Married    Spouse name: Not on file  . Number of children: 2  . Years of education: Not on file  . Highest education level: Not on file  Social Needs  . Financial resource strain: Not on file  . Food insecurity - worry: Not on file  . Food insecurity - inability: Not on file  . Transportation needs - medical: Not on file  . Transportation needs - non-medical: Not on file  Occupational History  . Occupation: Radio Licensed conveyancer    Comment: retired  Tobacco Use  . Smoking status: Never Smoker  . Smokeless tobacco: Never Used  Substance and Sexual Activity  . Alcohol use: Yes    Alcohol/week: 4.8 oz    Types: 8 Standard drinks or equivalent per week  . Drug use: No  . Sexual activity: Not on file  Other Topics Concern  . Not on file  Social History Narrative   Not sure about a living will or health care POA   Wife should make health care decisions for him--then daughter, then son   Would accept resuscitation attempts   Not sure about  tube feeds   Review of Systems Appetite is okay Has lost some weight Chronic sleep problems--trying to avoid the xanax during day, but uses at bedtime and occasionally a second one Tried melatonin--may have helped some  Objective:   Physical Exam  Constitutional: He appears well-developed. No distress.  Abdominal: Soft. He exhibits no distension. There is no tenderness. There is no rebound and no guarding.  Psychiatric:  Focused on somatic issues as usual Not overtly depressed          Assessment & Plan:

## 2017-12-20 NOTE — Assessment & Plan Note (Signed)
Still focused on his bowels Counseled on use of miralax and adjusting based on how his stools are May be influenced by his mood

## 2017-12-21 ENCOUNTER — Ambulatory Visit: Payer: Medicare PPO

## 2017-12-21 ENCOUNTER — Ambulatory Visit (INDEPENDENT_AMBULATORY_CARE_PROVIDER_SITE_OTHER): Payer: Medicare PPO

## 2017-12-21 DIAGNOSIS — L821 Other seborrheic keratosis: Secondary | ICD-10-CM | POA: Diagnosis not present

## 2017-12-21 DIAGNOSIS — L218 Other seborrheic dermatitis: Secondary | ICD-10-CM | POA: Diagnosis not present

## 2017-12-21 DIAGNOSIS — J4540 Moderate persistent asthma, uncomplicated: Secondary | ICD-10-CM

## 2017-12-22 MED ORDER — OMALIZUMAB 150 MG ~~LOC~~ SOLR
375.0000 mg | SUBCUTANEOUS | Status: DC
  Administered 2017-12-21: 375 mg via SUBCUTANEOUS

## 2017-12-22 NOTE — Progress Notes (Signed)
[name] came in on 12/21/2017 to receive a Xolair injection. The patient is given 375mg  every 14 days. Due to each vial equalling 150mg , 75mg  of medication was wasted.   Documentation of medication administration and charges of Xolair have been completed by Desmond Dike, CMA based on the [place] documentation sheet completed by [facility].

## 2017-12-31 ENCOUNTER — Other Ambulatory Visit: Payer: Self-pay | Admitting: Internal Medicine

## 2018-01-05 ENCOUNTER — Ambulatory Visit (INDEPENDENT_AMBULATORY_CARE_PROVIDER_SITE_OTHER): Payer: Medicare PPO

## 2018-01-05 DIAGNOSIS — J4540 Moderate persistent asthma, uncomplicated: Secondary | ICD-10-CM | POA: Diagnosis not present

## 2018-01-08 MED ORDER — OMALIZUMAB 150 MG ~~LOC~~ SOLR
375.0000 mg | SUBCUTANEOUS | Status: DC
  Administered 2018-01-05: 375 mg via SUBCUTANEOUS

## 2018-01-08 NOTE — Progress Notes (Signed)
[name] came in on 01/05/2018 to receive a Xolair injection. The patient is given 375mg  every 14 days. Due to each vial equalling 150mg , 75mg  of medication was wasted.   Documentation of medication administration and charges of Xolair have been completed by Desmond Dike, CMA based on the [place] documentation sheet completed by [facility].

## 2018-01-11 ENCOUNTER — Telehealth: Payer: Self-pay | Admitting: Pulmonary Disease

## 2018-01-11 NOTE — Telephone Encounter (Signed)
#   vials:6 Ordered date:01/11/2018 Shipping Date:01/11/2018

## 2018-01-12 NOTE — Telephone Encounter (Addendum)
#   PFS:6 (4) 150mg  (2)75mg  Arrival Date:01/12/18 Lot #:[id number] wrong # should be: [id number] 150mg   75mg  [id number] Exp Date:10/2018

## 2018-01-15 DIAGNOSIS — N319 Neuromuscular dysfunction of bladder, unspecified: Secondary | ICD-10-CM | POA: Diagnosis not present

## 2018-01-15 DIAGNOSIS — R339 Retention of urine, unspecified: Secondary | ICD-10-CM | POA: Diagnosis not present

## 2018-01-19 DIAGNOSIS — Z466 Encounter for fitting and adjustment of urinary device: Secondary | ICD-10-CM | POA: Diagnosis not present

## 2018-01-23 DIAGNOSIS — H6123 Impacted cerumen, bilateral: Secondary | ICD-10-CM | POA: Diagnosis not present

## 2018-01-23 DIAGNOSIS — H903 Sensorineural hearing loss, bilateral: Secondary | ICD-10-CM | POA: Diagnosis not present

## 2018-01-25 ENCOUNTER — Ambulatory Visit (INDEPENDENT_AMBULATORY_CARE_PROVIDER_SITE_OTHER): Payer: Medicare PPO

## 2018-01-25 ENCOUNTER — Ambulatory Visit: Payer: Medicare PPO

## 2018-01-25 DIAGNOSIS — J4550 Severe persistent asthma, uncomplicated: Secondary | ICD-10-CM | POA: Diagnosis not present

## 2018-01-25 MED ORDER — OMALIZUMAB 150 MG ~~LOC~~ SOLR
375.0000 mg | Freq: Once | SUBCUTANEOUS | Status: AC
  Administered 2018-01-25: 375 mg via SUBCUTANEOUS

## 2018-02-12 ENCOUNTER — Ambulatory Visit (INDEPENDENT_AMBULATORY_CARE_PROVIDER_SITE_OTHER): Payer: Medicare PPO

## 2018-02-12 DIAGNOSIS — J4540 Moderate persistent asthma, uncomplicated: Secondary | ICD-10-CM | POA: Diagnosis not present

## 2018-02-13 DIAGNOSIS — J4540 Moderate persistent asthma, uncomplicated: Secondary | ICD-10-CM

## 2018-02-13 MED ORDER — OMALIZUMAB 150 MG ~~LOC~~ SOLR
375.0000 mg | SUBCUTANEOUS | Status: DC
  Administered 2018-02-13: 375 mg via SUBCUTANEOUS

## 2018-02-13 NOTE — Progress Notes (Signed)
Documentation of medication administration and charges of Xolair have been completed by Marilu Rylander, CMA based on the Xolair documentation sheet completed by Tammy Scott.  

## 2018-02-20 ENCOUNTER — Telehealth: Payer: Self-pay | Admitting: Pulmonary Disease

## 2018-02-20 DIAGNOSIS — N319 Neuromuscular dysfunction of bladder, unspecified: Secondary | ICD-10-CM | POA: Diagnosis not present

## 2018-02-20 NOTE — Telephone Encounter (Signed)
Prefilled Syringe: #150mg  4  #75mg  2 Ordered Date: 02/20/18 Shipping Date: 02/20/18

## 2018-02-22 NOTE — Telephone Encounter (Signed)
Prefilled Syringes: # 150mg  4  #75mg  2 Arrival Date: 02/21/2018 Lot #: 150mg  [id number]      75mg [id number] Exp Date: 150mg  10/2018   75mg  10/2018

## 2018-02-26 ENCOUNTER — Other Ambulatory Visit: Payer: Self-pay | Admitting: Internal Medicine

## 2018-02-26 NOTE — Telephone Encounter (Signed)
Last filled 01-14-18 #90 Last OV 12-20-17 Next OV 05-03-18

## 2018-02-27 ENCOUNTER — Telehealth: Payer: Self-pay | Admitting: Pulmonary Disease

## 2018-02-27 NOTE — Telephone Encounter (Signed)
Routing this to TS.

## 2018-03-01 ENCOUNTER — Ambulatory Visit (INDEPENDENT_AMBULATORY_CARE_PROVIDER_SITE_OTHER): Payer: Medicare PPO

## 2018-03-01 ENCOUNTER — Ambulatory Visit: Payer: Medicare PPO

## 2018-03-01 DIAGNOSIS — J4540 Moderate persistent asthma, uncomplicated: Secondary | ICD-10-CM

## 2018-03-01 NOTE — Telephone Encounter (Signed)
Patient called again, I let Wilburn Cornelia know to let the patient know I would start work on getting him approved right away.

## 2018-03-02 DIAGNOSIS — D692 Other nonthrombocytopenic purpura: Secondary | ICD-10-CM | POA: Diagnosis not present

## 2018-03-02 DIAGNOSIS — L89312 Pressure ulcer of right buttock, stage 2: Secondary | ICD-10-CM | POA: Diagnosis not present

## 2018-03-02 MED ORDER — OMALIZUMAB 150 MG ~~LOC~~ SOLR
375.0000 mg | SUBCUTANEOUS | Status: DC
  Administered 2018-03-01: 375 mg via SUBCUTANEOUS

## 2018-03-02 NOTE — Progress Notes (Signed)
[name] came in on 03/01/2018 to receive a Xolair injection. The patient is given 375mg  every 14 days. Due to each vial equalling 150mg , 75mg  of medication was wasted.   Documentation of medication administration and charges of Xolair have been completed by Desmond Dike, CMA based on the [place] documentation sheet completed by [facility].

## 2018-03-09 NOTE — Telephone Encounter (Signed)
Kathlee Nations, please advise on status of pt's approval. Thanks!

## 2018-03-12 DIAGNOSIS — Z79899 Other long term (current) drug therapy: Secondary | ICD-10-CM | POA: Diagnosis not present

## 2018-03-12 DIAGNOSIS — M0579 Rheumatoid arthritis with rheumatoid factor of multiple sites without organ or systems involvement: Secondary | ICD-10-CM | POA: Diagnosis not present

## 2018-03-14 NOTE — Telephone Encounter (Signed)
Called [employer] on 03/01/18, they faxed me a prior auth form.  I have filled the form out and it is on Dr. Juanetta Gosling desk for signature.

## 2018-03-14 NOTE — Telephone Encounter (Signed)
Routing this back to [place] and Kathlee Nations, please advise on patients approval as he runs out in two days. Thank you.

## 2018-03-15 ENCOUNTER — Telehealth: Payer: Self-pay | Admitting: Pulmonary Disease

## 2018-03-15 NOTE — Telephone Encounter (Signed)
Prefilled Syringe: #150mg  4  #75mg  2 Ordered Date: 03/15/2018 Shipping Date: 03/19/2018

## 2018-03-19 ENCOUNTER — Ambulatory Visit (INDEPENDENT_AMBULATORY_CARE_PROVIDER_SITE_OTHER): Payer: Medicare PPO

## 2018-03-19 DIAGNOSIS — J4540 Moderate persistent asthma, uncomplicated: Secondary | ICD-10-CM

## 2018-03-20 MED ORDER — OMALIZUMAB 150 MG ~~LOC~~ SOLR
375.0000 mg | SUBCUTANEOUS | Status: DC
  Administered 2018-03-19: 375 mg via SUBCUTANEOUS

## 2018-03-20 NOTE — Progress Notes (Signed)
Documentation of medication administration and charges of Xolair have been completed by Lindsay Lemons, CMA based on the Xolair documentation sheet completed by Tammy Scott.  

## 2018-03-21 DIAGNOSIS — Z4682 Encounter for fitting and adjustment of non-vascular catheter: Secondary | ICD-10-CM | POA: Diagnosis not present

## 2018-03-21 NOTE — Telephone Encounter (Signed)
Recd form signed by Dr. Halford Chessman, faxed to [facility].

## 2018-03-21 NOTE — Telephone Encounter (Signed)
Ordered in error. Order not placed for this pt.Marland Kitchen

## 2018-03-22 ENCOUNTER — Encounter: Payer: Self-pay | Admitting: *Deleted

## 2018-03-22 ENCOUNTER — Encounter: Payer: Self-pay | Admitting: Family Medicine

## 2018-03-22 ENCOUNTER — Ambulatory Visit: Payer: Medicare PPO | Admitting: Family Medicine

## 2018-03-22 ENCOUNTER — Other Ambulatory Visit: Payer: Self-pay

## 2018-03-22 VITALS — BP 102/70 | HR 67 | Temp 97.5°F | Ht 71.0 in | Wt 217.5 lb

## 2018-03-22 DIAGNOSIS — R600 Localized edema: Secondary | ICD-10-CM | POA: Diagnosis not present

## 2018-03-22 DIAGNOSIS — R5383 Other fatigue: Secondary | ICD-10-CM | POA: Diagnosis not present

## 2018-03-22 DIAGNOSIS — Z79899 Other long term (current) drug therapy: Secondary | ICD-10-CM | POA: Diagnosis not present

## 2018-03-22 DIAGNOSIS — L03116 Cellulitis of left lower limb: Secondary | ICD-10-CM | POA: Diagnosis not present

## 2018-03-22 DIAGNOSIS — Z79631 Long term (current) use of antimetabolite agent: Secondary | ICD-10-CM

## 2018-03-22 MED ORDER — CEPHALEXIN 500 MG PO CAPS: 1000.0000 mg | ORAL_CAPSULE | Freq: Two times a day (BID) | ORAL | 0 refills | Status: DC

## 2018-03-22 NOTE — Progress Notes (Signed)
Dr. Frederico Hamman T. Annabelle Rexroad, MD, [facility] [place], [address] Phone: [phone] Fax: [phone]  03/22/2018  Patient: [name], MRN: [id number], DOB: 28-Dec-1933, 84 y.o.  Primary Physician:  [name], MD   Chief Complaint  Patient presents with  . Leg Pain    Bilateral  . Foot Swelling    Swollen and red  . Fatigue   Subjective:   [name] is a 84 y.o. very pleasant male patient who presents with the following:  L leg cellulitis  Now having some bilateral leg pain and having some heat to the touch. Has an indwelling catheter.  Having a lot of pain in the legs and redness in the lower legs.  Has had some cellulitis in the past.  ? Cellulitis.   Actually appeared a little bit worse on the left but they are so ago, now he and his daughter both think it does look a little bit better, but is still quite tender to palpation as well as red and somewhat warm.  There is no history of trauma.  He did try some topical remedies including some topical aloe which did not seem to help.  He does have some bluish coloration to his feet, and per he and his daughter this is not new and this is been there for multiple years.  Does have a little bit of some worsening of some edema  Past Medical History, Surgical History, Social History, Family History, Problem List, Medications, and Allergies have been reviewed and updated if relevant.  Patient Active Problem List   Diagnosis Date Noted  . Complicated UTI (urinary tract infection) 10/18/2017  . SIADH (syndrome of inappropriate ADH production) ([place]) 04/17/2017  . Hyponatremia 01/16/2017  . Asthma with acute exacerbation 11/14/2016  . Mood disorder ([place]) 01/18/2016  . Presence of indwelling urinary catheter 08/20/2015  . Advanced directives, counseling/discussion 07/28/2014  . Routine general medical examination at a health care facility 04/09/2012  . History of  prostate cancer 04/09/2012  . Venous stasis dermatitis 06/13/2011  . Chronic sinusitis   . CONSTIPATION, CHRONIC 12/07/2010  . Hyperlipemia 12/02/2010  . OSTEOARTHRITIS 12/02/2010  . NEUROGENIC BLADDER 05/24/2010  . IBS 05/05/2010  . PERSONAL HISTORY OF COLONIC POLYPS 05/05/2010  . NEUROPATHY 05/03/2010  . Essential hypertension, benign 03/25/2009  . GAD (generalized anxiety disorder) 01/27/2007  . Coronary atherosclerosis of native coronary artery 01/27/2007  . Allergic asthma 01/27/2007  . GERD 01/27/2007  . Chronic rheumatic arthritis ([place]) 01/27/2007    Past Medical History:  Diagnosis Date  . Allergy   . Anemia   . Anxiety   . Arthritis    rheumatoid  . Asthma   . CAD (coronary artery disease)   . Cancer [facility])    Prostate  . Chronic sinusitis   . Depression   . Diverticulitis   . ED (erectile dysfunction)   . GERD (gastroesophageal reflux disease)   . History of SIADH   . Hx of adenomatous colonic polyps   . Hypertension   . Hyponatremia   . IBS (irritable bowel syndrome)   . Neurodermatitis   . Neurogenic bladder   . OSA (obstructive sleep apnea)     Past Surgical History:  Procedure Laterality Date  . KNEE ARTHROPLASTY Right 09/02/2015   Procedure: COMPUTER ASSISTED TOTAL KNEE ARTHROPLASTY;  Surgeon: [name], MD;  Location: [facility] ORS;  Service: Orthopedics;  Laterality: Right;  . KNEE SURGERY Left  ARTHROSCOPY LEFT  . NASAL SINUS SURGERY  2009   DEVIATED SEPTUM AND POLYPS  . PROSTATE SURGERY     PROSTATECTOMY  . TOTAL KNEE ARTHROPLASTY Left 9/15   Dr [name]  . URETHRAL STRICTURE DILATATION  02-2010   Dr.Cope    Social History   Socioeconomic History  . Marital status: Married    Spouse name: Not on file  . Number of children: 2  . Years of education: Not on file  . Highest education level: Not on file  Occupational History  . 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 Financial resource strain: Not on file    . Food insecurity:    Worry: Not on file    Inability: Not on file  . Transportation needs:    Medical: Not on file    Non-medical: Not on file  Tobacco Use  . Smoking status: Never Smoker  . Smokeless tobacco: Never Used  Substance and Sexual Activity  . Alcohol use: Yes    Alcohol/week: 4.8 oz    Types: 8 Standard drinks or equivalent per week  . Drug use: No  . Sexual activity: Not on file  Lifestyle  . Physical activity:    Days per week: Not on file    Minutes per session: Not on file  . Stress: Not on file  Relationships  . Social connections:    Talks on phone: Not on file    Gets together: Not on file    Attends religious service: Not on file    Active member of club or organization: Not on file    Attends meetings of clubs or organizations: Not on file    Relationship status: Not on file  . Intimate partner violence:    Fear of current or ex partner: Not on file    Emotionally abused: Not on file    Physically abused: Not on file    Forced sexual activity: Not on file  Other Topics Concern  . Not on file  Social History Narrative   Not sure about a living will or health care POA   Wife should make health care decisions for him--then daughter, then son   Would accept resuscitation attempts   Not sure about tube feeds    Family History  Problem Relation Age of Onset  . Heart disease Mother 28       heart failure  . Pneumonia Father 65       pnemonia  . Colon cancer Neg Hx     Allergies  Allergen Reactions  . Ciprofloxacin Nausea And Vomiting    Headache  . Citalopram Hydrobromide Other (See Comments)    "unknown"  . Lorazepam     Adverse reaction  . Paroxetine Nausea Only  . Ramipril Other (See Comments)    "unknown"  . Simvastatin   . Sulfa Antibiotics Other (See Comments)    Medication list reviewed and updated in full in [place].  GEN: no acute illness or fever CV: No chest pain or shortness of breath MSK: detailed  above Neuro: neurological signs are described above ROS O/w per HPI  Objective:   BP 102/70   Pulse 67   Temp (!) 97.5 F (36.4 C)   Ht 5\' 11"  (1.803 m)   Wt 217 lb 8 oz (98.7 kg)   SpO2 97%   BMI 30.34 kg/m    GEN: WDWN, NAD, Non-toxic, Alert & Oriented x 3 HEENT: Atraumatic,  Normocephalic.  Ears and Nose: No external deformity. EXTR: No clubbing/cyanosis/1+ edema NEURO: Normal gait.  PSYCH: Normally interactive. Conversant. Not depressed or anxious appearing.  Calm demeanor.    Around the middle portion of the tibia on the left there is some redness and warmth and tenderness to palpation.  There appears to be somewhat similar on the right, but to a much lesser degree.  Radiology: No results found.  Assessment and Plan:   Cellulitis of left leg  Lower extremity edema  Other fatigue  Long term methotrexate user  Left leg cellulitis and elderly patient with multiple medical problems and ongoing methotrexate use.  Reviewed precautions with he and his daughter, who can check on this every day along with his wife who is highly functioning.  If things worsen over the weekend including the weekend, they understand the need to get medical care.  Follow-up: No follow-ups on file.  Meds ordered this encounter  Medications  . cephALEXin (KEFLEX) 500 MG capsule    Sig: Take 2 capsules (1,000 mg total) by mouth 2 (two) times daily.    Dispense:  40 capsule    Refill:  0   Signed,  Arma Reining T. Loretha Ure, MD   Allergies as of 03/22/2018      Reactions   Ciprofloxacin Nausea And Vomiting   Headache   Citalopram Hydrobromide Other (See Comments)   "unknown"   Lorazepam    Adverse reaction   Paroxetine Nausea Only   Ramipril Other (See Comments)   "unknown"   Simvastatin    Sulfa Antibiotics Other (See Comments)      Medication List        Accurate as of 03/22/18 11:59 PM. Always use your most recent med list.          albuterol 108 (90 Base) MCG/ACT  inhaler Commonly known as:  PROVENTIL HFA;VENTOLIN HFA Inhale 2 puffs into the lungs every 6 (six) hours as needed for wheezing or shortness of breath.   ALIGN 4 MG Caps Take 1 capsule (4 mg total) by mouth daily.   ALPRAZolam 0.5 MG tablet Commonly known as:  XANAX TAKE 1 TABLET BY MOUTH THREE TIMES DAILY AS NEEDED FOR ANXIETY OR SLEEP   aspirin 81 MG tablet Take 81 mg by mouth daily.   cephALEXin 500 MG capsule Commonly known as:  KEFLEX Take 2 capsules (1,000 mg total) by mouth 2 (two) times daily.   Co Q 10 100 MG Caps Take 1 capsule by mouth daily.   Cranberry 425 MG Caps Take 2 capsules by mouth daily.   fluticasone 50 MCG/ACT nasal spray Commonly known as:  FLONASE Place 2 sprays into both nostrils 2 (two) times daily.   folic acid 1 MG tablet Commonly known as:  FOLVITE Take 1 tablet (1 mg total) by mouth daily.   furosemide 20 MG tablet Commonly known as:  LASIX TAKE 1 TABLET BY MOUTH EVERY DAY   gabapentin 300 MG capsule Commonly known as:  NEURONTIN TAKE 1 CAPSULE BY MOUTH THREE TIMES DAILY   hydroxychloroquine 200 MG tablet Commonly known as:  PLAQUENIL Take 1 tablet by mouth 2 (two) times daily.   methotrexate 2.5 MG tablet Commonly known as:  RHEUMATREX Take 10 tablets by mouth once a week.   Milk Thistle 250 MG Caps Take 1 capsule by mouth daily.   multivitamin tablet Take 1 tablet by mouth daily.   nystatin powder Commonly known as:  MYCOSTATIN/NYSTOP Apply topically 4 (four) times daily.   omeprazole  20 MG capsule Commonly known as:  PRILOSEC Take 1 capsule (20 mg total) by mouth daily as needed.   polyethylene glycol packet Commonly known as:  MIRALAX / GLYCOLAX Take 34 g by mouth daily.   pravastatin 20 MG tablet Commonly known as:  PRAVACHOL TAKE 1 TABLET BY MOUTH EVERY DAY   Selenium 200 MCG Caps Take 1 capsule by mouth daily.   solifenacin 5 MG tablet Commonly known as:  VESICARE Take 5 mg by mouth at bedtime. For  bladder spasms   traMADol 50 MG tablet Commonly known as:  ULTRAM Take 1 tablet (50 mg total) 4 (four) times daily as needed by mouth.   Turmeric 500 MG Caps Take 500 mg by mouth daily.   vitamin B-12 500 MCG tablet Commonly known as:  CYANOCOBALAMIN Take 500 mcg by mouth daily.   Vitamin D3 5000 units Caps Take 1 capsule by mouth daily.

## 2018-03-23 NOTE — Telephone Encounter (Signed)
[place] approval letter re'cd today- Xolair has been approved from 4.24.19 through 4.23.2021. [name] will order medication accordingly.

## 2018-03-28 ENCOUNTER — Telehealth: Payer: Self-pay | Admitting: Pulmonary Disease

## 2018-03-28 NOTE — Telephone Encounter (Signed)
x

## 2018-03-28 NOTE — Telephone Encounter (Signed)
[name] ordered [employer] additional phone note for order information.

## 2018-03-29 DIAGNOSIS — M069 Rheumatoid arthritis, unspecified: Secondary | ICD-10-CM | POA: Diagnosis not present

## 2018-03-29 DIAGNOSIS — F419 Anxiety disorder, unspecified: Secondary | ICD-10-CM | POA: Diagnosis not present

## 2018-03-29 DIAGNOSIS — J45909 Unspecified asthma, uncomplicated: Secondary | ICD-10-CM | POA: Diagnosis not present

## 2018-03-29 DIAGNOSIS — I2510 Atherosclerotic heart disease of native coronary artery without angina pectoris: Secondary | ICD-10-CM | POA: Diagnosis not present

## 2018-03-29 DIAGNOSIS — R600 Localized edema: Secondary | ICD-10-CM | POA: Diagnosis not present

## 2018-03-29 DIAGNOSIS — Z882 Allergy status to sulfonamides status: Secondary | ICD-10-CM | POA: Diagnosis not present

## 2018-03-29 DIAGNOSIS — L03116 Cellulitis of left lower limb: Secondary | ICD-10-CM | POA: Diagnosis not present

## 2018-03-29 DIAGNOSIS — L539 Erythematous condition, unspecified: Secondary | ICD-10-CM | POA: Diagnosis not present

## 2018-03-29 DIAGNOSIS — E785 Hyperlipidemia, unspecified: Secondary | ICD-10-CM | POA: Diagnosis not present

## 2018-03-29 DIAGNOSIS — M1990 Unspecified osteoarthritis, unspecified site: Secondary | ICD-10-CM | POA: Diagnosis not present

## 2018-03-29 DIAGNOSIS — M79662 Pain in left lower leg: Secondary | ICD-10-CM | POA: Diagnosis not present

## 2018-03-29 NOTE — Telephone Encounter (Signed)
Prefilled Syringes: # 150mg  4  #75mg  2 Arrival Date: 03/29/2018 Lot #: 150mg  [id number]      75mg  [id number] Exp Date: 150mg  10/2018   75mg  10/2018

## 2018-04-04 DIAGNOSIS — L98419 Non-pressure chronic ulcer of buttock with unspecified severity: Secondary | ICD-10-CM | POA: Diagnosis not present

## 2018-04-04 DIAGNOSIS — R600 Localized edema: Secondary | ICD-10-CM | POA: Diagnosis not present

## 2018-04-05 ENCOUNTER — Ambulatory Visit: Payer: Medicare PPO

## 2018-04-05 DIAGNOSIS — H40003 Preglaucoma, unspecified, bilateral: Secondary | ICD-10-CM | POA: Diagnosis not present

## 2018-04-09 ENCOUNTER — Ambulatory Visit (INDEPENDENT_AMBULATORY_CARE_PROVIDER_SITE_OTHER): Payer: Medicare PPO

## 2018-04-09 DIAGNOSIS — J4540 Moderate persistent asthma, uncomplicated: Secondary | ICD-10-CM | POA: Diagnosis not present

## 2018-04-10 MED ORDER — OMALIZUMAB 150 MG ~~LOC~~ SOLR
375.0000 mg | SUBCUTANEOUS | Status: DC
  Administered 2018-04-09: 375 mg via SUBCUTANEOUS

## 2018-04-10 NOTE — Progress Notes (Signed)
Documentation of medication administration and charges of Xolair have been completed by Lindsay Lemons, CMA based on the Xolair documentation sheet completed by Tammy Scott.  

## 2018-04-13 ENCOUNTER — Other Ambulatory Visit: Payer: Self-pay | Admitting: Internal Medicine

## 2018-04-13 NOTE — Telephone Encounter (Signed)
Last filled 02-26-18 #90  Last OV 03-22-18 Next OV 05-03-18  Forward to Dr Glori Bickers in Dr Alla German absence

## 2018-04-16 DIAGNOSIS — H40003 Preglaucoma, unspecified, bilateral: Secondary | ICD-10-CM | POA: Diagnosis not present

## 2018-04-23 DIAGNOSIS — R509 Fever, unspecified: Secondary | ICD-10-CM | POA: Diagnosis not present

## 2018-04-23 DIAGNOSIS — N319 Neuromuscular dysfunction of bladder, unspecified: Secondary | ICD-10-CM | POA: Diagnosis not present

## 2018-04-23 DIAGNOSIS — R0981 Nasal congestion: Secondary | ICD-10-CM | POA: Diagnosis not present

## 2018-04-23 DIAGNOSIS — R52 Pain, unspecified: Secondary | ICD-10-CM | POA: Diagnosis not present

## 2018-04-23 DIAGNOSIS — Z8546 Personal history of malignant neoplasm of prostate: Secondary | ICD-10-CM | POA: Diagnosis not present

## 2018-04-23 DIAGNOSIS — J189 Pneumonia, unspecified organism: Secondary | ICD-10-CM | POA: Diagnosis not present

## 2018-04-23 DIAGNOSIS — I10 Essential (primary) hypertension: Secondary | ICD-10-CM | POA: Diagnosis not present

## 2018-04-23 DIAGNOSIS — E785 Hyperlipidemia, unspecified: Secondary | ICD-10-CM | POA: Diagnosis not present

## 2018-04-23 DIAGNOSIS — M069 Rheumatoid arthritis, unspecified: Secondary | ICD-10-CM | POA: Diagnosis not present

## 2018-04-23 DIAGNOSIS — Z5189 Encounter for other specified aftercare: Secondary | ICD-10-CM | POA: Diagnosis not present

## 2018-04-23 DIAGNOSIS — R262 Difficulty in walking, not elsewhere classified: Secondary | ICD-10-CM | POA: Diagnosis not present

## 2018-04-23 DIAGNOSIS — K219 Gastro-esophageal reflux disease without esophagitis: Secondary | ICD-10-CM | POA: Diagnosis not present

## 2018-04-23 DIAGNOSIS — R05 Cough: Secondary | ICD-10-CM | POA: Diagnosis not present

## 2018-04-23 DIAGNOSIS — J159 Unspecified bacterial pneumonia: Secondary | ICD-10-CM | POA: Diagnosis not present

## 2018-04-23 DIAGNOSIS — I2510 Atherosclerotic heart disease of native coronary artery without angina pectoris: Secondary | ICD-10-CM | POA: Diagnosis not present

## 2018-04-23 DIAGNOSIS — I4510 Unspecified right bundle-branch block: Secondary | ICD-10-CM | POA: Diagnosis not present

## 2018-04-23 DIAGNOSIS — M0579 Rheumatoid arthritis with rheumatoid factor of multiple sites without organ or systems involvement: Secondary | ICD-10-CM | POA: Diagnosis not present

## 2018-04-23 DIAGNOSIS — J181 Lobar pneumonia, unspecified organism: Secondary | ICD-10-CM | POA: Diagnosis not present

## 2018-04-23 DIAGNOSIS — J029 Acute pharyngitis, unspecified: Secondary | ICD-10-CM | POA: Diagnosis not present

## 2018-04-23 DIAGNOSIS — R0989 Other specified symptoms and signs involving the circulatory and respiratory systems: Secondary | ICD-10-CM | POA: Diagnosis not present

## 2018-04-23 DIAGNOSIS — F411 Generalized anxiety disorder: Secondary | ICD-10-CM | POA: Diagnosis not present

## 2018-04-23 DIAGNOSIS — Z79899 Other long term (current) drug therapy: Secondary | ICD-10-CM | POA: Diagnosis not present

## 2018-04-23 DIAGNOSIS — F419 Anxiety disorder, unspecified: Secondary | ICD-10-CM | POA: Diagnosis not present

## 2018-04-23 DIAGNOSIS — R0902 Hypoxemia: Secondary | ICD-10-CM | POA: Diagnosis not present

## 2018-04-23 DIAGNOSIS — E222 Syndrome of inappropriate secretion of antidiuretic hormone: Secondary | ICD-10-CM | POA: Diagnosis not present

## 2018-04-23 DIAGNOSIS — Z882 Allergy status to sulfonamides status: Secondary | ICD-10-CM | POA: Diagnosis not present

## 2018-04-23 DIAGNOSIS — R54 Age-related physical debility: Secondary | ICD-10-CM | POA: Diagnosis not present

## 2018-04-23 DIAGNOSIS — R918 Other nonspecific abnormal finding of lung field: Secondary | ICD-10-CM | POA: Diagnosis not present

## 2018-04-23 DIAGNOSIS — E871 Hypo-osmolality and hyponatremia: Secondary | ICD-10-CM | POA: Diagnosis not present

## 2018-04-23 DIAGNOSIS — M6281 Muscle weakness (generalized): Secondary | ICD-10-CM | POA: Diagnosis not present

## 2018-04-23 DIAGNOSIS — J1520 Pneumonia due to staphylococcus, unspecified: Secondary | ICD-10-CM | POA: Diagnosis not present

## 2018-04-24 DIAGNOSIS — Z79899 Other long term (current) drug therapy: Secondary | ICD-10-CM | POA: Insufficient documentation

## 2018-04-24 DIAGNOSIS — E669 Obesity, unspecified: Secondary | ICD-10-CM | POA: Insufficient documentation

## 2018-04-24 DIAGNOSIS — R54 Age-related physical debility: Secondary | ICD-10-CM | POA: Insufficient documentation

## 2018-04-27 DIAGNOSIS — J45901 Unspecified asthma with (acute) exacerbation: Secondary | ICD-10-CM | POA: Diagnosis not present

## 2018-04-27 DIAGNOSIS — F1190 Opioid use, unspecified, uncomplicated: Secondary | ICD-10-CM | POA: Diagnosis not present

## 2018-04-27 DIAGNOSIS — R262 Difficulty in walking, not elsewhere classified: Secondary | ICD-10-CM | POA: Diagnosis not present

## 2018-04-27 DIAGNOSIS — R110 Nausea: Secondary | ICD-10-CM | POA: Diagnosis not present

## 2018-04-27 DIAGNOSIS — K219 Gastro-esophageal reflux disease without esophagitis: Secondary | ICD-10-CM | POA: Diagnosis not present

## 2018-04-27 DIAGNOSIS — J189 Pneumonia, unspecified organism: Secondary | ICD-10-CM | POA: Diagnosis not present

## 2018-04-27 DIAGNOSIS — E222 Syndrome of inappropriate secretion of antidiuretic hormone: Secondary | ICD-10-CM | POA: Diagnosis not present

## 2018-04-27 DIAGNOSIS — Z96653 Presence of artificial knee joint, bilateral: Secondary | ICD-10-CM | POA: Diagnosis not present

## 2018-04-27 DIAGNOSIS — Z743 Need for continuous supervision: Secondary | ICD-10-CM | POA: Diagnosis not present

## 2018-04-27 DIAGNOSIS — J159 Unspecified bacterial pneumonia: Secondary | ICD-10-CM | POA: Diagnosis not present

## 2018-04-27 DIAGNOSIS — Z8546 Personal history of malignant neoplasm of prostate: Secondary | ICD-10-CM | POA: Diagnosis not present

## 2018-04-27 DIAGNOSIS — E785 Hyperlipidemia, unspecified: Secondary | ICD-10-CM | POA: Diagnosis not present

## 2018-04-27 DIAGNOSIS — F411 Generalized anxiety disorder: Secondary | ICD-10-CM | POA: Diagnosis not present

## 2018-04-27 DIAGNOSIS — F419 Anxiety disorder, unspecified: Secondary | ICD-10-CM | POA: Diagnosis not present

## 2018-04-27 DIAGNOSIS — J45909 Unspecified asthma, uncomplicated: Secondary | ICD-10-CM | POA: Diagnosis not present

## 2018-04-27 DIAGNOSIS — I2510 Atherosclerotic heart disease of native coronary artery without angina pectoris: Secondary | ICD-10-CM | POA: Diagnosis not present

## 2018-04-27 DIAGNOSIS — I10 Essential (primary) hypertension: Secondary | ICD-10-CM | POA: Diagnosis not present

## 2018-04-27 DIAGNOSIS — E871 Hypo-osmolality and hyponatremia: Secondary | ICD-10-CM | POA: Diagnosis not present

## 2018-04-27 DIAGNOSIS — J9811 Atelectasis: Secondary | ICD-10-CM | POA: Diagnosis not present

## 2018-04-27 DIAGNOSIS — Z7982 Long term (current) use of aspirin: Secondary | ICD-10-CM | POA: Diagnosis not present

## 2018-04-27 DIAGNOSIS — E875 Hyperkalemia: Secondary | ICD-10-CM | POA: Diagnosis not present

## 2018-04-27 DIAGNOSIS — N319 Neuromuscular dysfunction of bladder, unspecified: Secondary | ICD-10-CM | POA: Diagnosis not present

## 2018-04-27 DIAGNOSIS — N390 Urinary tract infection, site not specified: Secondary | ICD-10-CM | POA: Diagnosis not present

## 2018-04-27 DIAGNOSIS — R5381 Other malaise: Secondary | ICD-10-CM | POA: Diagnosis not present

## 2018-04-27 DIAGNOSIS — R109 Unspecified abdominal pain: Secondary | ICD-10-CM | POA: Diagnosis present

## 2018-04-27 DIAGNOSIS — Z79899 Other long term (current) drug therapy: Secondary | ICD-10-CM | POA: Diagnosis not present

## 2018-04-27 DIAGNOSIS — M0579 Rheumatoid arthritis with rheumatoid factor of multiple sites without organ or systems involvement: Secondary | ICD-10-CM | POA: Diagnosis not present

## 2018-04-27 DIAGNOSIS — M069 Rheumatoid arthritis, unspecified: Secondary | ICD-10-CM | POA: Diagnosis not present

## 2018-04-27 DIAGNOSIS — Z466 Encounter for fitting and adjustment of urinary device: Secondary | ICD-10-CM | POA: Diagnosis not present

## 2018-04-27 DIAGNOSIS — R52 Pain, unspecified: Secondary | ICD-10-CM | POA: Diagnosis not present

## 2018-04-27 DIAGNOSIS — M6281 Muscle weakness (generalized): Secondary | ICD-10-CM | POA: Diagnosis not present

## 2018-04-27 DIAGNOSIS — Z5189 Encounter for other specified aftercare: Secondary | ICD-10-CM | POA: Diagnosis not present

## 2018-04-27 MED ORDER — POLYETHYLENE GLYCOL 3350 17 G PO PACK
17.00 | PACK | ORAL | Status: DC
Start: 2018-04-28 — End: 2018-04-27

## 2018-04-27 MED ORDER — PANTOPRAZOLE SODIUM 20 MG PO TBEC
20.00 | DELAYED_RELEASE_TABLET | ORAL | Status: DC
Start: 2018-04-28 — End: 2018-04-27

## 2018-04-27 MED ORDER — ASPIRIN 81 MG PO CHEW
81.00 | CHEWABLE_TABLET | ORAL | Status: DC
Start: 2018-04-28 — End: 2018-04-27

## 2018-04-27 MED ORDER — ACETAMINOPHEN 325 MG PO TABS
650.00 | ORAL_TABLET | ORAL | Status: DC
Start: ? — End: 2018-04-27

## 2018-04-27 MED ORDER — PRAVASTATIN SODIUM 20 MG PO TABS
20.00 | ORAL_TABLET | ORAL | Status: DC
Start: 2018-04-27 — End: 2018-04-27

## 2018-04-27 MED ORDER — ALBUTEROL SULFATE HFA 108 (90 BASE) MCG/ACT IN AERS
2.00 | INHALATION_SPRAY | RESPIRATORY_TRACT | Status: DC
Start: ? — End: 2018-04-27

## 2018-04-27 MED ORDER — GENERIC EXTERNAL MEDICATION
2.00 | Status: DC
Start: ? — End: 2018-04-27

## 2018-04-27 MED ORDER — ENOXAPARIN SODIUM 40 MG/0.4ML ~~LOC~~ SOLN
40.00 | SUBCUTANEOUS | Status: DC
Start: 2018-04-27 — End: 2018-04-27

## 2018-04-27 MED ORDER — MELATONIN 3 MG PO TABS
6.00 | ORAL_TABLET | ORAL | Status: DC
Start: 2018-04-27 — End: 2018-04-27

## 2018-04-27 MED ORDER — ALPRAZOLAM 0.25 MG PO TABS
0.25 | ORAL_TABLET | ORAL | Status: DC
Start: ? — End: 2018-04-27

## 2018-04-27 MED ORDER — THERA-M PO TABS
1.00 | ORAL_TABLET | ORAL | Status: DC
Start: 2018-04-28 — End: 2018-04-27

## 2018-04-27 MED ORDER — IBUPROFEN 400 MG PO TABS
400.00 | ORAL_TABLET | ORAL | Status: DC
Start: ? — End: 2018-04-27

## 2018-04-27 MED ORDER — CEFDINIR 300 MG PO CAPS
300.00 | ORAL_CAPSULE | ORAL | Status: DC
Start: 2018-04-27 — End: 2018-04-27

## 2018-04-27 MED ORDER — HYDROXYCHLOROQUINE SULFATE 200 MG PO TABS
200.00 | ORAL_TABLET | ORAL | Status: DC
Start: 2018-04-27 — End: 2018-04-27

## 2018-04-27 MED ORDER — SIMETHICONE 80 MG PO CHEW
80.00 | CHEWABLE_TABLET | ORAL | Status: DC
Start: ? — End: 2018-04-27

## 2018-04-27 MED ORDER — ONDANSETRON 4 MG PO TBDP
4.00 | ORAL_TABLET | ORAL | Status: DC
Start: ? — End: 2018-04-27

## 2018-04-27 MED ORDER — GUAIFENESIN 100 MG/5ML PO SYRP
200.00 | ORAL_SOLUTION | ORAL | Status: DC
Start: ? — End: 2018-04-27

## 2018-04-27 MED ORDER — GABAPENTIN 300 MG PO CAPS
300.00 | ORAL_CAPSULE | ORAL | Status: DC
Start: 2018-04-27 — End: 2018-04-27

## 2018-04-27 MED ORDER — FOLIC ACID 1 MG PO TABS
1000.00 | ORAL_TABLET | ORAL | Status: DC
Start: 2018-04-28 — End: 2018-04-27

## 2018-04-27 MED ORDER — DOXYCYCLINE HYCLATE 100 MG PO TABS
100.00 | ORAL_TABLET | ORAL | Status: DC
Start: 2018-04-27 — End: 2018-04-27

## 2018-04-29 DIAGNOSIS — F419 Anxiety disorder, unspecified: Secondary | ICD-10-CM | POA: Diagnosis not present

## 2018-04-29 DIAGNOSIS — R110 Nausea: Secondary | ICD-10-CM | POA: Diagnosis not present

## 2018-04-29 DIAGNOSIS — J45901 Unspecified asthma with (acute) exacerbation: Secondary | ICD-10-CM | POA: Diagnosis not present

## 2018-04-29 DIAGNOSIS — N319 Neuromuscular dysfunction of bladder, unspecified: Secondary | ICD-10-CM | POA: Diagnosis not present

## 2018-04-29 DIAGNOSIS — M069 Rheumatoid arthritis, unspecified: Secondary | ICD-10-CM | POA: Diagnosis not present

## 2018-04-29 DIAGNOSIS — E222 Syndrome of inappropriate secretion of antidiuretic hormone: Secondary | ICD-10-CM | POA: Diagnosis not present

## 2018-04-29 DIAGNOSIS — J189 Pneumonia, unspecified organism: Secondary | ICD-10-CM | POA: Diagnosis not present

## 2018-04-30 ENCOUNTER — Ambulatory Visit: Payer: Medicare PPO

## 2018-04-30 DIAGNOSIS — Z466 Encounter for fitting and adjustment of urinary device: Secondary | ICD-10-CM | POA: Diagnosis not present

## 2018-05-01 ENCOUNTER — Telehealth: Payer: Self-pay

## 2018-05-01 NOTE — Telephone Encounter (Signed)
Spoke to wife about pt's appt that is scheduled for 05-03-18. He is at [employer] so she said to cancel it for now. I have done that. She said he is doing pretty good. Getting his antibiotics. Hopes to be home soon. They will call to schedule OV once he gets out to F/U.

## 2018-05-02 NOTE — Telephone Encounter (Signed)
That sounds fine

## 2018-05-03 ENCOUNTER — Ambulatory Visit: Payer: Self-pay | Admitting: Urology

## 2018-05-03 ENCOUNTER — Encounter: Payer: Medicare PPO | Admitting: Internal Medicine

## 2018-05-03 DIAGNOSIS — R110 Nausea: Secondary | ICD-10-CM | POA: Diagnosis not present

## 2018-05-03 DIAGNOSIS — J189 Pneumonia, unspecified organism: Secondary | ICD-10-CM | POA: Diagnosis not present

## 2018-05-03 DIAGNOSIS — E222 Syndrome of inappropriate secretion of antidiuretic hormone: Secondary | ICD-10-CM | POA: Diagnosis not present

## 2018-05-03 DIAGNOSIS — F1190 Opioid use, unspecified, uncomplicated: Secondary | ICD-10-CM | POA: Diagnosis not present

## 2018-05-03 DIAGNOSIS — M069 Rheumatoid arthritis, unspecified: Secondary | ICD-10-CM | POA: Diagnosis not present

## 2018-05-03 DIAGNOSIS — J45901 Unspecified asthma with (acute) exacerbation: Secondary | ICD-10-CM | POA: Diagnosis not present

## 2018-05-03 DIAGNOSIS — N319 Neuromuscular dysfunction of bladder, unspecified: Secondary | ICD-10-CM | POA: Diagnosis not present

## 2018-05-04 ENCOUNTER — Ambulatory Visit: Payer: Medicare PPO

## 2018-05-05 ENCOUNTER — Other Ambulatory Visit: Payer: Self-pay

## 2018-05-05 ENCOUNTER — Emergency Department: Payer: Medicare PPO

## 2018-05-05 ENCOUNTER — Emergency Department
Admission: EM | Admit: 2018-05-05 | Discharge: 2018-05-05 | Disposition: A | Discharge status: Skilled Nursing Facility | Payer: Medicare PPO | Attending: Emergency Medicine | Admitting: Emergency Medicine

## 2018-05-05 DIAGNOSIS — J45909 Unspecified asthma, uncomplicated: Secondary | ICD-10-CM | POA: Insufficient documentation

## 2018-05-05 DIAGNOSIS — R109 Unspecified abdominal pain: Secondary | ICD-10-CM

## 2018-05-05 DIAGNOSIS — Z79899 Other long term (current) drug therapy: Secondary | ICD-10-CM | POA: Diagnosis not present

## 2018-05-05 DIAGNOSIS — Z96653 Presence of artificial knee joint, bilateral: Secondary | ICD-10-CM | POA: Diagnosis not present

## 2018-05-05 DIAGNOSIS — I2510 Atherosclerotic heart disease of native coronary artery without angina pectoris: Secondary | ICD-10-CM | POA: Diagnosis not present

## 2018-05-05 DIAGNOSIS — I10 Essential (primary) hypertension: Secondary | ICD-10-CM | POA: Diagnosis not present

## 2018-05-05 DIAGNOSIS — E871 Hypo-osmolality and hyponatremia: Secondary | ICD-10-CM | POA: Diagnosis not present

## 2018-05-05 DIAGNOSIS — Z8546 Personal history of malignant neoplasm of prostate: Secondary | ICD-10-CM | POA: Insufficient documentation

## 2018-05-05 DIAGNOSIS — J9811 Atelectasis: Secondary | ICD-10-CM | POA: Diagnosis not present

## 2018-05-05 DIAGNOSIS — Z7982 Long term (current) use of aspirin: Secondary | ICD-10-CM | POA: Insufficient documentation

## 2018-05-05 LAB — CBC WITH DIFFERENTIAL/PLATELET
Basophils Absolute: 0.1 10*3/uL (ref 0–0.1)
Basophils Relative: 1 %
EOS PCT: 5 %
Eosinophils Absolute: 0.4 10*3/uL (ref 0–0.7)
HEMATOCRIT: 36.4 % — AB (ref 40.0–52.0)
Hemoglobin: 12.9 g/dL — ABNORMAL LOW (ref 13.0–18.0)
LYMPHS PCT: 10 %
Lymphs Abs: 0.8 10*3/uL — ABNORMAL LOW (ref 1.0–3.6)
MCH: 32.8 pg (ref 26.0–34.0)
MCHC: 35.4 g/dL (ref 32.0–36.0)
MCV: 92.6 fL (ref 80.0–100.0)
MONO ABS: 0.5 10*3/uL (ref 0.2–1.0)
MONOS PCT: 7 %
NEUTROS ABS: 6.1 10*3/uL (ref 1.4–6.5)
Neutrophils Relative %: 77 %
Platelets: 286 10*3/uL (ref 150–440)
RBC: 3.93 MIL/uL — ABNORMAL LOW (ref 4.40–5.90)
RDW: 14.2 % (ref 11.5–14.5)
WBC: 7.9 10*3/uL (ref 3.8–10.6)

## 2018-05-05 LAB — LIPASE, BLOOD: LIPASE: 39 U/L (ref 11–51)

## 2018-05-05 LAB — COMPREHENSIVE METABOLIC PANEL
ALT: 18 U/L (ref 17–63)
ANION GAP: 9 (ref 5–15)
AST: 23 U/L (ref 15–41)
Albumin: 3.5 g/dL (ref 3.5–5.0)
Alkaline Phosphatase: 60 U/L (ref 38–126)
BILIRUBIN TOTAL: 0.6 mg/dL (ref 0.3–1.2)
BUN: 14 mg/dL (ref 6–20)
CO2: 25 mmol/L (ref 22–32)
Calcium: 8.5 mg/dL — ABNORMAL LOW (ref 8.9–10.3)
Chloride: 91 mmol/L — ABNORMAL LOW (ref 101–111)
Creatinine, Ser: 0.63 mg/dL (ref 0.61–1.24)
Glucose, Bld: 98 mg/dL (ref 65–99)
POTASSIUM: 4 mmol/L (ref 3.5–5.1)
Sodium: 125 mmol/L — ABNORMAL LOW (ref 135–145)
TOTAL PROTEIN: 6.9 g/dL (ref 6.5–8.1)

## 2018-05-05 LAB — TROPONIN I

## 2018-05-05 MED ORDER — SODIUM CHLORIDE 0.9 % IV BOLUS
500.0000 mL | Freq: Once | INTRAVENOUS | Status: AC
  Administered 2018-05-05: 500 mL via INTRAVENOUS

## 2018-05-05 NOTE — ED Provider Notes (Signed)
[facility] Emergency Department Provider Note  Time seen: 5:44 AM  I have reviewed the triage vital signs and the nursing notes.   HISTORY  Chief Complaint Abdominal Pain    HPI [name] is a 84 y.o. male with a past medical history of CAD, prostate cancer, gastric reflux, hypertension, neurogenic bladder with chronic indwelling Foley catheter who presents to the emergency department from his nursing facility for evaluation.  According to the patient he woke this morning with a strange sensation like he is having cramping or discomfort all over his body like his insides have "frozen up."  Patient denies any "pain."  No nausea or vomiting.  No diarrhea although states he has had loose stool for several weeks.  Patient has indwelling Foley catheter currently being treated for urinary tract infection and a recent pneumonia per patient.  Denies any fever at home.  Patient is able to give a very accurate history.  Past Medical History:  Diagnosis Date  . Allergy   . Anemia   . Anxiety   . Arthritis    rheumatoid  . Asthma   . CAD (coronary artery disease)   . Cancer [facility])    Prostate  . Chronic sinusitis   . Depression   . Diverticulitis   . ED (erectile dysfunction)   . GERD (gastroesophageal reflux disease)   . History of SIADH   . Hx of adenomatous colonic polyps   . Hypertension   . Hyponatremia   . IBS (irritable bowel syndrome)   . Neurodermatitis   . Neurogenic bladder   . OSA (obstructive sleep apnea)     Patient Active Problem List   Diagnosis Date Noted  . Complicated UTI (urinary tract infection) 10/18/2017  . SIADH (syndrome of inappropriate ADH production) ([place]) 04/17/2017  . Hyponatremia 01/16/2017  . Asthma with acute exacerbation 11/14/2016  . Mood disorder ([place]) 01/18/2016  . Presence of indwelling urinary catheter 08/20/2015  . Advanced directives, counseling/discussion 07/28/2014  . Routine general medical examination at a  health care facility 04/09/2012  . History of prostate cancer 04/09/2012  . Venous stasis dermatitis 06/13/2011  . Chronic sinusitis   . CONSTIPATION, CHRONIC 12/07/2010  . Hyperlipemia 12/02/2010  . OSTEOARTHRITIS 12/02/2010  . NEUROGENIC BLADDER 05/24/2010  . IBS 05/05/2010  . PERSONAL HISTORY OF COLONIC POLYPS 05/05/2010  . NEUROPATHY 05/03/2010  . Essential hypertension, benign 03/25/2009  . GAD (generalized anxiety disorder) 01/27/2007  . Coronary atherosclerosis of native coronary artery 01/27/2007  . Allergic asthma 01/27/2007  . GERD 01/27/2007  . Chronic rheumatic arthritis ([place]) 01/27/2007    Past Surgical History:  Procedure Laterality Date  . KNEE ARTHROPLASTY Right 09/02/2015   Procedure: COMPUTER ASSISTED TOTAL KNEE ARTHROPLASTY;  Surgeon: [name], MD;  Location: [facility] ORS;  Service: Orthopedics;  Laterality: Right;  . KNEE SURGERY Left    ARTHROSCOPY LEFT  . NASAL SINUS SURGERY  2009   DEVIATED SEPTUM AND POLYPS  . PROSTATE SURGERY     PROSTATECTOMY  . TOTAL KNEE ARTHROPLASTY Left 9/15   Dr [name]  . URETHRAL STRICTURE DILATATION  02-2010   Dr.Cope    Prior to Admission medications   Medication Sig Start Date End Date Taking? Authorizing Provider  albuterol (PROVENTIL HFA;VENTOLIN HFA) 108 (90 Base) MCG/ACT inhaler Inhale 2 puffs into the lungs every 6 (six) hours as needed for wheezing or shortness of breath. 11/14/16   [name], MD  ALPRAZolam Duanne Moron) 0.5 MG tablet TAKE 1 TABLET BY  MOUTH THREE TIMES DAILY AS NEEDED FOR ANXIETY OR SLEEP 04/13/18   Tower, Wynelle Fanny, MD  aspirin 81 MG tablet Take 81 mg by mouth daily.    [provider]  cephALEXin (KEFLEX) 500 MG capsule Take 2 capsules (1,000 mg total) by mouth 2 (two) times daily. 03/22/18   Copland, Frederico Hamman, MD  Cholecalciferol (VITAMIN D3) 5000 UNITS CAPS Take 1 capsule by mouth daily.    [provider]  Coenzyme Q10 (CO Q 10) 100 MG CAPS Take 1 capsule by mouth daily.     [provider]  Cranberry 425 MG CAPS Take 2 capsules by mouth daily.    [provider]  fluticasone (FLONASE) 50 MCG/ACT nasal spray Place 2 sprays into both nostrils 2 (two) times daily. 04/27/17   [name], MD  folic acid (FOLVITE) 1 MG tablet Take 1 tablet (1 mg total) by mouth daily. 04/27/17   [name], MD  furosemide (LASIX) 20 MG tablet TAKE 1 TABLET BY MOUTH EVERY DAY Patient taking differently: TAKE 1 TABLET BY mouth 3 times a week 09/19/17   [name], MD  gabapentin (NEURONTIN) 300 MG capsule TAKE 1 CAPSULE BY MOUTH THREE TIMES DAILY 07/12/16   [name], MD  hydroxychloroquine (PLAQUENIL) 200 MG tablet Take 1 tablet by mouth 2 (two) times daily. 01/22/16   [provider]  methotrexate (RHEUMATREX) 2.5 MG tablet Take 10 tablets by mouth once a week. 01/07/16   [provider]  Milk Thistle 250 MG CAPS Take 1 capsule by mouth daily.    [provider]  Multiple Vitamin (MULTIVITAMIN) tablet Take 1 tablet by mouth daily.      [provider]  nystatin (MYCOSTATIN/NYSTOP) powder Apply topically 4 (four) times daily. 11/13/17   [name], MD  omeprazole (PRILOSEC) 20 MG capsule Take 1 capsule (20 mg total) by mouth daily as needed. 01/01/18   [name], MD  polyethylene glycol [facility]) packet Take 34 g by mouth daily.     [provider]  pravastatin (PRAVACHOL) 20 MG tablet TAKE 1 TABLET BY MOUTH EVERY DAY 05/29/17   [name], MD  Probiotic Product (ALIGN) 4 MG CAPS Take 1 capsule (4 mg total) by mouth daily. 12/15/16   Nicholes Mango, MD  Selenium 200 MCG CAPS Take 1 capsule by mouth daily.    [provider]  solifenacin (VESICARE) 5 MG tablet Take 5 mg by mouth at bedtime. For bladder spasms    [provider]  traMADol (ULTRAM) 50 MG tablet Take 1 tablet (50 mg total) 4 (four) times daily as needed by mouth. 10/10/17   [name], MD  Turmeric 500  MG CAPS Take 500 mg by mouth daily.    [provider]  vitamin B-12 (CYANOCOBALAMIN) 500 MCG tablet Take 500 mcg by mouth daily.    [provider]    Allergies  Allergen Reactions  . Ciprofloxacin Nausea And Vomiting    Headache  . Citalopram Hydrobromide Other (See Comments)    "unknown"  . Lorazepam     Adverse reaction  . Paroxetine Nausea Only  . Ramipril Other (See Comments)    "unknown"  . Simvastatin   . Sulfa Antibiotics Other (See Comments)    Family History  Problem Relation Age of Onset  . Heart disease Mother 76       heart failure  . Pneumonia Father 66       pnemonia  .  Colon cancer Neg Hx     Social History Social History   Tobacco Use  . Smoking status: Never Smoker  . Smokeless tobacco: Never Used  Substance Use Topics  . Alcohol use: Yes    Alcohol/week: 4.8 oz    Types: 8 Standard drinks or equivalent per week  . Drug use: No    Review of Systems Constitutional: Negative for fever. Eyes: Negative for visual complaints ENT: Negative for recent illness/congestion Cardiovascular: Negative for chest pain. Respiratory: Negative for shortness of breath. Gastrointestinal: Mild abdominal cramping.  States loose stool but negative for vomiting.  Denies diarrhea. Genitourinary: Negative for urinary compaints Musculoskeletal: Negative for musculoskeletal complaints Skin: Negative for skin complaints  Neurological: Negative for headache All other ROS negative  ____________________________________________   PHYSICAL EXAM:  Constitutional: Alert and oriented.  Overall well-appearing, no distress. Eyes: Normal exam ENT   Head: Normocephalic and atraumatic   Mouth/Throat: Mucous membranes are moist. Cardiovascular: Normal rate, regular rhythm.  Respiratory: Normal respiratory effort without tachypnea nor retractions. Breath sounds are clear Gastrointestinal: Soft and nontender. No distention.   Musculoskeletal: Nontender  with normal range of motion in all extremities Neurologic:  Normal speech and language. No gross focal neurologic deficits  Skin:  Skin is warm, dry and intact.  Psychiatric: Mood and affect are normal.  ____________________________________________     RADIOLOGY  Chest x-ray pending  ____________________________________________   INITIAL IMPRESSION / ASSESSMENT AND PLAN / ED COURSE  Pertinent labs & imaging results that were available during my care of the patient were reviewed by me and considered in my medical decision making (see chart for details).  Patient presents to the emergency department for medical evaluation.  He is coming from his nursing facility where he states he was feeling like his insides were "frozen up."  He is having a lot of difficulty trying to explain what exactly he was feeling today.  He is concerned because he is currently undergoing treatment for urinary tract infection has a chronic indwelling Foley catheter since 2012.  Was concerned that maybe he was getting a kidney infection.  When asked if he is hurting anywhere he says he is not having "pain."  But still does not feel right.  Denies any fever, nausea, vomiting, diarrhea.  No chest pain or trouble breathing although states he was recently diagnosed with pneumonia as well.  Differential is quite broad but would include electrolyte or metabolic abnormality (patient's chronic hyponatremia), pneumonia, urinary tract infection, other infectious etiology.  Patient's vitals are normal besides slightly lower saturation.  I reviewed the patient's recent discharge from [facility] he was admitted for community-acquired pneumonia.  His labs have resulted largely at baseline.  He is hyponatremic 125 but is currently being treated for this at his facility.  He has a history of chronic hyponatremia and this is largely unchanged from baseline.  Troponin negative.  Chemistry otherwise within normal limits.  Awaiting chest  x-ray and urinalysis results.  Patient is currently on antibiotics at his nursing facility.  I anticipate likely discharge home.  I discussed this with the patient who is agreeable to plan of care.  Patient care signed out to oncoming physician.  ____________________________________________   FINAL CLINICAL IMPRESSION(S) / ED DIAGNOSES  Abdominal pain    Harvest Dark, MD 05/05/18 [phone]

## 2018-05-05 NOTE — ED Notes (Signed)
Dr. Paduchowski at bedside.  

## 2018-05-05 NOTE — Discharge Instructions (Signed)
Please follow-up with your doctor in the next 1 to 2 days for recheck/reevaluation.  Return to the emergency department for any fever, or any other symptoms personally concerning to yourself.

## 2018-05-05 NOTE — ED Triage Notes (Signed)
Pt arrived via EMS from [facility] with complaints of abdominal pain and cramping. Pt states having bladder spasms and abdominal pressure. Pt was diagnosed this week with a UTI and was diagnosed with Ciprofloxin. Pt has a foley cath. EMS reported that he was on contact precautions at the facility. Pt denies having fever and denies Nausea and vomiting. VS per EMS BP-175/80 HR-75 O2sat-95%RA. Pt is alert and oriented x 4.

## 2018-05-07 ENCOUNTER — Telehealth: Payer: Self-pay

## 2018-05-07 NOTE — Telephone Encounter (Signed)
[name] spoke to pt's wife. She said he is doing better, but is "crabby" and ready to come home from rehab. Will be out in a few days. Has appt next week for F/U

## 2018-05-10 DIAGNOSIS — J45909 Unspecified asthma, uncomplicated: Secondary | ICD-10-CM | POA: Diagnosis not present

## 2018-05-10 DIAGNOSIS — N319 Neuromuscular dysfunction of bladder, unspecified: Secondary | ICD-10-CM | POA: Diagnosis not present

## 2018-05-10 DIAGNOSIS — I10 Essential (primary) hypertension: Secondary | ICD-10-CM | POA: Diagnosis not present

## 2018-05-10 DIAGNOSIS — I2510 Atherosclerotic heart disease of native coronary artery without angina pectoris: Secondary | ICD-10-CM | POA: Diagnosis not present

## 2018-05-10 DIAGNOSIS — M25512 Pain in left shoulder: Secondary | ICD-10-CM | POA: Diagnosis not present

## 2018-05-10 DIAGNOSIS — M545 Low back pain: Secondary | ICD-10-CM | POA: Diagnosis not present

## 2018-05-10 DIAGNOSIS — F329 Major depressive disorder, single episode, unspecified: Secondary | ICD-10-CM | POA: Diagnosis not present

## 2018-05-10 DIAGNOSIS — G8929 Other chronic pain: Secondary | ICD-10-CM | POA: Diagnosis not present

## 2018-05-10 DIAGNOSIS — M25511 Pain in right shoulder: Secondary | ICD-10-CM | POA: Diagnosis not present

## 2018-05-14 ENCOUNTER — Ambulatory Visit: Payer: Medicare PPO | Admitting: Pulmonary Disease

## 2018-05-14 ENCOUNTER — Encounter: Payer: Self-pay | Admitting: Pulmonary Disease

## 2018-05-14 ENCOUNTER — Telehealth: Payer: Self-pay | Admitting: Internal Medicine

## 2018-05-14 VITALS — BP 148/70 | HR 100 | Ht 70.0 in | Wt 210.0 lb

## 2018-05-14 DIAGNOSIS — J4550 Severe persistent asthma, uncomplicated: Secondary | ICD-10-CM

## 2018-05-14 NOTE — Telephone Encounter (Signed)
Can this wait until tomorrow since I don't know this patient?

## 2018-05-14 NOTE — Telephone Encounter (Signed)
Copied from [place] [id number]. Topic: General - Other >> May 14, 2018  9:29 AM Carolyn Stare wrote:  Aldrin a PT with [facility] call to ask for verbal orders  for home health pt 3 x 2 and then 2 x 3    Can leave a message   [phone]

## 2018-05-14 NOTE — Progress Notes (Signed)
[name], Critical Care, and Sleep Medicine  Chief Complaint  Patient presents with  . Follow-up    Pt was in hospital [facility] in [place]  for PNA 3 weeks ago. Pt is very tired daily with joint weakness.    Vital signs: BP (!) 148/70 (BP Location: Left Arm, Cuff Size: Normal)   Pulse 100   Ht 5\' 10"  (1.778 m)   Wt 210 lb (95.3 kg)   SpO2 95%   BMI 30.13 kg/m   History of Present Illness: [name] is a 84 y.o. male with allergic rhinitis.  He was in hospital twice for PNA, UTI, and hyponatremia.  He was then d/c to rehab, and finally back home last week.  Hasn't restart MTX yet.  Joints are feeling more achy.  Uses walker to ambulate.  Hasn't been as active since leaving rehab.  Home PT to start tomorrow.  Not having cough, wheeze, sputum chest pain.  Had to use nebulizer initially after hospital d/c, but no longer feels he needs it.    Physical Exam:  General - pleasant Eyes - pupils reactive ENT - no sinus tenderness, no oral exudate, no LAN Cardiac - regular, no murmur Chest - no wheeze, rales Abd - soft, non tender Ext - no edema Skin - no rashes Neuro - normal strength Psych - normal mood  CXR 05/05/18 - atelectasis   Assessment/Plan:  Allergic asthma and rhinitis. - can resume xolair - prn flonase nasal irrigation - prn albuterol  Hx of rheumatoid arthritis with rounded ATX on CT chest October 2018. - advised him to f/u with PCP to make sure infection has cleared before resuming MTX  Hyponatremia. - he has f/u with PCP later this week, and nephrology next week   Patient Instructions  Follow up in 6 months   Willie Mires, MD [place] 05/14/2018, 3:22 PM  Flow Sheet  [name] tests: PFT 04/07/10 >> FEV1 2.61 (86%), FEV1% 73, TLC 7.26 (85%), TLC 112% Ct chest 03/15/17 > atherosclerosis, 3.4 cm Lt lung base density rounded ATX, granuloma in lingula, 3 mm RLL nodule, 4 mm RLL nodule CT chest 09/18/17  >>atherosclerosis, rounded ATX LLL, stable 4 mm RLL nodule, calcified granuloma in lingula, stable 4 mm RLL nodule  Past Medical History: He  has a past medical history of Allergy, Anemia, Anxiety, Arthritis, Asthma, CAD (coronary artery disease), Cancer ([place]), Chronic sinusitis, Depression, Diverticulitis, ED (erectile dysfunction), GERD (gastroesophageal reflux disease), History of SIADH, adenomatous colonic polyps, Hypertension, Hyponatremia, IBS (irritable bowel syndrome), Neurodermatitis, Neurogenic bladder, and OSA (obstructive sleep apnea).  Past Surgical History: He  has a past surgical history that includes Prostate surgery; Knee surgery (Left); Nasal sinus surgery (2009); Urethra dilation (11-9145); Total knee arthroplasty (Left, 9/15); and Knee Arthroplasty (Right, 09/02/2015).  Family History: His family history includes Heart disease (age of onset: 35) in his mother; Pneumonia (age of onset: 91) in his father.  Social History: He  reports that he has never smoked. He has never used smokeless tobacco. He reports that he drinks about 4.8 oz of alcohol per week. He reports that he does not use drugs.  Medications: Allergies as of 05/14/2018      Reactions   Ciprofloxacin Nausea And Vomiting   Headache   Citalopram Hydrobromide Other (See Comments)   "unknown"   Lorazepam    Adverse reaction   Paroxetine Nausea Only   Ramipril Other (See Comments)   "unknown"   Simvastatin    Sulfa Antibiotics Other (See  Comments)      Medication List        Accurate as of 05/14/18  3:22 PM. Always use your most recent med list.          albuterol 108 (90 Base) MCG/ACT inhaler Commonly known as:  PROVENTIL HFA;VENTOLIN HFA Inhale 2 puffs into the lungs every 6 (six) hours as needed for wheezing or shortness of breath.   ALIGN 4 MG Caps Take 1 capsule (4 mg total) by mouth daily.   ALPRAZolam 0.5 MG tablet Commonly known as:  XANAX TAKE 1 TABLET BY MOUTH THREE TIMES DAILY AS  NEEDED FOR ANXIETY OR SLEEP   aspirin 81 MG tablet Take 81 mg by mouth daily.   cephALEXin 500 MG capsule Commonly known as:  KEFLEX Take 2 capsules (1,000 mg total) by mouth 2 (two) times daily.   Co Q 10 100 MG Caps Take 1 capsule by mouth daily.   Cranberry 425 MG Caps Take 2 capsules by mouth daily.   fluticasone 50 MCG/ACT nasal spray Commonly known as:  FLONASE Place 2 sprays into both nostrils 2 (two) times daily.   folic acid 1 MG tablet Commonly known as:  FOLVITE Take 1 tablet (1 mg total) by mouth daily.   furosemide 20 MG tablet Commonly known as:  LASIX TAKE 1 TABLET BY MOUTH EVERY DAY   gabapentin 300 MG capsule Commonly known as:  NEURONTIN TAKE 1 CAPSULE BY MOUTH THREE TIMES DAILY   hydroxychloroquine 200 MG tablet Commonly known as:  PLAQUENIL Take 1 tablet by mouth 2 (two) times daily.   methotrexate 2.5 MG tablet Commonly known as:  RHEUMATREX Take 10 tablets by mouth once a week.   Milk Thistle 250 MG Caps Take 1 capsule by mouth daily.   multivitamin tablet Take 1 tablet by mouth daily.   nystatin powder Commonly known as:  MYCOSTATIN/NYSTOP Apply topically 4 (four) times daily.   omeprazole 20 MG capsule Commonly known as:  PRILOSEC Take 1 capsule (20 mg total) by mouth daily as needed.   polyethylene glycol packet Commonly known as:  MIRALAX / GLYCOLAX Take 34 g by mouth daily.   pravastatin 20 MG tablet Commonly known as:  PRAVACHOL TAKE 1 TABLET BY MOUTH EVERY DAY   Selenium 200 MCG Caps Take 1 capsule by mouth daily.   traMADol 50 MG tablet Commonly known as:  ULTRAM Take 1 tablet (50 mg total) 4 (four) times daily as needed by mouth.   Turmeric 500 MG Caps Take 500 mg by mouth daily.   vitamin B-12 500 MCG tablet Commonly known as:  CYANOCOBALAMIN Take 500 mcg by mouth daily.   Vitamin D3 5000 units Caps Take 1 capsule by mouth daily.

## 2018-05-14 NOTE — Telephone Encounter (Signed)
Dr Silvio Pate out of office untl 05/15/18.

## 2018-05-14 NOTE — Patient Instructions (Signed)
Follow up in 6 months 

## 2018-05-14 NOTE — Telephone Encounter (Signed)
That is fine 

## 2018-05-15 ENCOUNTER — Encounter: Payer: Self-pay | Admitting: Pulmonary Disease

## 2018-05-15 DIAGNOSIS — M25511 Pain in right shoulder: Secondary | ICD-10-CM | POA: Diagnosis not present

## 2018-05-15 DIAGNOSIS — N319 Neuromuscular dysfunction of bladder, unspecified: Secondary | ICD-10-CM | POA: Diagnosis not present

## 2018-05-15 DIAGNOSIS — M545 Low back pain: Secondary | ICD-10-CM | POA: Diagnosis not present

## 2018-05-15 DIAGNOSIS — J45909 Unspecified asthma, uncomplicated: Secondary | ICD-10-CM | POA: Diagnosis not present

## 2018-05-15 DIAGNOSIS — G8929 Other chronic pain: Secondary | ICD-10-CM | POA: Diagnosis not present

## 2018-05-15 DIAGNOSIS — I2510 Atherosclerotic heart disease of native coronary artery without angina pectoris: Secondary | ICD-10-CM | POA: Diagnosis not present

## 2018-05-15 DIAGNOSIS — M25512 Pain in left shoulder: Secondary | ICD-10-CM | POA: Diagnosis not present

## 2018-05-15 DIAGNOSIS — I10 Essential (primary) hypertension: Secondary | ICD-10-CM | POA: Diagnosis not present

## 2018-05-15 DIAGNOSIS — F329 Major depressive disorder, single episode, unspecified: Secondary | ICD-10-CM | POA: Diagnosis not present

## 2018-05-15 NOTE — Telephone Encounter (Signed)
Left detailed message with verbal orders.  

## 2018-05-16 ENCOUNTER — Ambulatory Visit: Payer: Self-pay

## 2018-05-16 DIAGNOSIS — N319 Neuromuscular dysfunction of bladder, unspecified: Secondary | ICD-10-CM | POA: Diagnosis not present

## 2018-05-16 DIAGNOSIS — I10 Essential (primary) hypertension: Secondary | ICD-10-CM | POA: Diagnosis not present

## 2018-05-16 DIAGNOSIS — J45909 Unspecified asthma, uncomplicated: Secondary | ICD-10-CM | POA: Diagnosis not present

## 2018-05-16 DIAGNOSIS — I2510 Atherosclerotic heart disease of native coronary artery without angina pectoris: Secondary | ICD-10-CM | POA: Diagnosis not present

## 2018-05-16 DIAGNOSIS — M545 Low back pain: Secondary | ICD-10-CM | POA: Diagnosis not present

## 2018-05-16 DIAGNOSIS — M25512 Pain in left shoulder: Secondary | ICD-10-CM | POA: Diagnosis not present

## 2018-05-16 DIAGNOSIS — M25511 Pain in right shoulder: Secondary | ICD-10-CM | POA: Diagnosis not present

## 2018-05-16 DIAGNOSIS — G8929 Other chronic pain: Secondary | ICD-10-CM | POA: Diagnosis not present

## 2018-05-16 DIAGNOSIS — F329 Major depressive disorder, single episode, unspecified: Secondary | ICD-10-CM | POA: Diagnosis not present

## 2018-05-16 NOTE — Telephone Encounter (Signed)
Patient called in and says "I was treated for a UTI and I finished the antibiotics last Friday. Last night I had pain in my bladder, it was a 7-8. I'm up now and the pain is not bad at all. I was just wondering if I could come up there to give a urine sample and when I come in for my appointment tomorrow, Dr. Silvio Pate can go over it with me. I had to be put on 2 different antibiotics." I asked the patient what color is the urine in the tube, if it is odorous. He says "it is clear in the tube, in the bag it is dark yellow, but there is no odor, it's not cloudy." I asked about pain in his flank area, he denies. I asked about fever, he denies. I advised him to continue to monitor his urine, urine output, stay hydrated, take Tylenol for pain, monitor for fever. I advised if at any time over the next 24 hours if any changes or if he gets worse, to go to the ED for evaluation, otherwise come to the appointment tomorrow, he verbalized understanding.   Answer Assessment - Initial Assessment Questions 1. SYMPTOMS: "What symptoms are you concerned about?"     Bladder pain 2. ONSET:  "When did the symptoms start?"     This morning 3. FEVER: "Is there a fever?" If so, ask: "What is the temperature, how was it measured, and when did it start?"     No 4. ABDOMINAL PAIN: "Is there any abdominal pain?" (e.g., Scale 1-10; or mild, moderate, severe)     Not as bad now, it was a 7-8 5. URINE COLOR: "What color is the urine?"  "Is there blood present in the urine?" (e.g., clear, yellow, cloudy, tea-colored, blood streaks, bright red)     Clear  6. ONSET: "When was the catheter inserted?"     Chronic, due to be changed on Friday 7. OTHER SYMPTOMS: "Do you have any other symptoms?" (e.g., back pain, bad urine odor)      No 8. PREGNANCY: "Is there any chance you are pregnant?" "When was your last menstrual period?"     N/A  Protocols used: URINARY CATHETER SYMPTOMS AND QUESTIONS-A-AH

## 2018-05-17 ENCOUNTER — Ambulatory Visit: Payer: Medicare PPO | Admitting: Internal Medicine

## 2018-05-17 ENCOUNTER — Encounter: Payer: Self-pay | Admitting: Internal Medicine

## 2018-05-17 VITALS — BP 134/72 | HR 74 | Temp 98.0°F | Ht 71.0 in | Wt 213.0 lb

## 2018-05-17 DIAGNOSIS — J181 Lobar pneumonia, unspecified organism: Secondary | ICD-10-CM

## 2018-05-17 DIAGNOSIS — M05741 Rheumatoid arthritis with rheumatoid factor of right hand without organ or systems involvement: Secondary | ICD-10-CM | POA: Diagnosis not present

## 2018-05-17 DIAGNOSIS — E222 Syndrome of inappropriate secretion of antidiuretic hormone: Secondary | ICD-10-CM

## 2018-05-17 DIAGNOSIS — M05742 Rheumatoid arthritis with rheumatoid factor of left hand without organ or systems involvement: Secondary | ICD-10-CM | POA: Diagnosis not present

## 2018-05-17 LAB — RENAL FUNCTION PANEL
ALBUMIN: 3.6 g/dL (ref 3.5–5.2)
BUN: 12 mg/dL (ref 6–23)
CALCIUM: 9 mg/dL (ref 8.4–10.5)
CO2: 32 mEq/L (ref 19–32)
CREATININE: 0.65 mg/dL (ref 0.40–1.50)
Chloride: 99 mEq/L (ref 96–112)
GFR: 125 mL/min (ref >60.00)
GLUCOSE: 96 mg/dL (ref 70–99)
Phosphorus: 3.5 mg/dL (ref 2.3–4.6)
Potassium: 4.1 mEq/L (ref 3.5–5.1)
SODIUM: 135 meq/L (ref 135–145)

## 2018-05-17 MED ORDER — NYSTATIN 100000 UNIT/GM EX POWD: Freq: Four times a day (QID) | CUTANEOUS | 2 refills | Status: DC

## 2018-05-17 NOTE — Progress Notes (Signed)
Subjective:    Patient ID: [name], male    DOB: Jun 13, 1934, 84 y.o.   MRN: [id number]  HPI Here for follow up after hospitalization and rehab stay  Was admitted for pneumonia on Labor Day Had left greater than right bibasilar opacities Got parenteral antibiotics and was converted to oral antibiotics Concurrent viral infection as well? Repeat CXR showed atelectasis but clearing of the pneumonia  Also had severe hyponatremia As low as 120--then up to 129 with fluid restriction Still on salt tabs but not the lasix (since the first week in rehab)  Sent to rehab at [facility] Soon after diagnosed with Pseudomonas UTI (due to low grade fever) Culture was only 25K though Rx with cipro  Home for 8 days now Was fine till yesterday--- then he fell yesterday trying to pick up a banana Leaned down and then lost control--and just collapsed Hit right head on stove--but not hard Slightly sore last night--but better today Still weak from rehab--- daughter needed help from son to get him up Now has chest pain ---since his son had to hold him so hard to get him up Some neck soreness also  Very weak still Continuing with home therapy--still home bound  Current Outpatient Medications on File Prior to Visit  Medication Sig Dispense Refill  . albuterol (PROVENTIL HFA;VENTOLIN HFA) 108 (90 Base) MCG/ACT inhaler Inhale 2 puffs into the lungs every 6 (six) hours as needed for wheezing or shortness of breath. 1 Inhaler 3  . ALPRAZolam (XANAX) 0.5 MG tablet TAKE 1 TABLET BY MOUTH THREE TIMES DAILY AS NEEDED FOR ANXIETY OR SLEEP 90 tablet 0  . aspirin 81 MG tablet Take 81 mg by mouth daily.    . Cholecalciferol (VITAMIN D3) 5000 UNITS CAPS Take 1 capsule by mouth daily.    . Coenzyme Q10 (CO Q 10) 100 MG CAPS Take 1 capsule by mouth daily.    . Cranberry 425 MG CAPS Take 2 capsules by mouth daily.    . fluticasone (FLONASE) 50 MCG/ACT nasal spray Place 2 sprays into both nostrils 2 (two)  times daily. 48 g 3  . folic acid (FOLVITE) 1 MG tablet Take 1 tablet (1 mg total) by mouth daily. 90 tablet 3  . furosemide (LASIX) 20 MG tablet TAKE 1 TABLET BY MOUTH EVERY DAY (Patient taking differently: TAKE 1 TABLET BY mouth 3 times a week) 30 tablet 11  . gabapentin (NEURONTIN) 300 MG capsule TAKE 1 CAPSULE BY MOUTH THREE TIMES DAILY 90 capsule 11  . hydroxychloroquine (PLAQUENIL) 200 MG tablet Take 1 tablet by mouth 2 (two) times daily.    . methotrexate (RHEUMATREX) 2.5 MG tablet Take 10 tablets by mouth once a week.    . Milk Thistle 250 MG CAPS Take 1 capsule by mouth daily.    . Multiple Vitamin (MULTIVITAMIN) tablet Take 1 tablet by mouth daily.      Marland Kitchen nystatin (MYCOSTATIN/NYSTOP) powder Apply topically 4 (four) times daily. 15 g 2  . omeprazole (PRILOSEC) 20 MG capsule Take 1 capsule (20 mg total) by mouth daily as needed. 90 capsule 1  . polyethylene glycol (GLYCOLAX) packet Take 34 g by mouth daily.     . pravastatin (PRAVACHOL) 20 MG tablet TAKE 1 TABLET BY MOUTH EVERY DAY 90 tablet 3  . Probiotic Product (ALIGN) 4 MG CAPS Take 1 capsule (4 mg total) by mouth daily. 15 capsule 0  . Selenium 200 MCG CAPS Take 1 capsule by mouth daily.    Marland Kitchen  traMADol (ULTRAM) 50 MG tablet Take 1 tablet (50 mg total) 4 (four) times daily as needed by mouth. 120 tablet 0  . Turmeric 500 MG CAPS Take 500 mg by mouth daily.    . vitamin B-12 (CYANOCOBALAMIN) 500 MCG tablet Take 500 mcg by mouth daily.     Current Facility-Administered Medications on File Prior to Visit  Medication Dose Route Frequency Provider Last Rate Last Dose  . omalizumab Arvid Right) injection 375 mg  375 mg Subcutaneous Q14 Days Chesley Mires, MD   375 mg at 02/13/18 1045  . omalizumab Arvid Right) injection 375 mg  375 mg Subcutaneous Q14 Days Baird Lyons D, MD   375 mg at 03/01/18 3500  . omalizumab Arvid Right) injection 375 mg  375 mg Subcutaneous Q14 Days Chesley Mires, MD   375 mg at 03/19/18 0851  . omalizumab Arvid Right) injection  375 mg  375 mg Subcutaneous Q14 Days Chesley Mires, MD   375 mg at 04/09/18 1001    Allergies  Allergen Reactions  . Ciprofloxacin Nausea And Vomiting    Headache  . Citalopram Hydrobromide Other (See Comments)    "unknown"  . Lorazepam     Adverse reaction  . Paroxetine Nausea Only  . Ramipril Other (See Comments)    "unknown"  . Simvastatin   . Sulfa Antibiotics Other (See Comments)    Past Medical History:  Diagnosis Date  . Allergy   . Anemia   . Anxiety   . Arthritis    rheumatoid  . Asthma   . CAD (coronary artery disease)   . Cancer [facility])    Prostate  . Chronic sinusitis   . Depression   . Diverticulitis   . ED (erectile dysfunction)   . GERD (gastroesophageal reflux disease)   . History of SIADH   . Hx of adenomatous colonic polyps   . Hypertension   . Hyponatremia   . IBS (irritable bowel syndrome)   . Neurodermatitis   . Neurogenic bladder   . OSA (obstructive sleep apnea)     Past Surgical History:  Procedure Laterality Date  . KNEE ARTHROPLASTY Right 09/02/2015   Procedure: COMPUTER ASSISTED TOTAL KNEE ARTHROPLASTY;  Surgeon: [name], MD;  Location: [facility] ORS;  Service: Orthopedics;  Laterality: Right;  . KNEE SURGERY Left    ARTHROSCOPY LEFT  . NASAL SINUS SURGERY  2009   DEVIATED SEPTUM AND POLYPS  . PROSTATE SURGERY     PROSTATECTOMY  . TOTAL KNEE ARTHROPLASTY Left 9/15   Dr [name]  . URETHRAL STRICTURE DILATATION  02-2010   Dr.Cope    Family History  Problem Relation Age of Onset  . Heart disease Mother 10       heart failure  . Pneumonia Father 58       pnemonia  . Colon cancer Neg Hx     Social History   Socioeconomic History  . Marital status: Married    Spouse name: Not on file  . Number of children: 2  . Years of education: Not on file  . Highest education level: Not on file  Occupational History  . 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 Financial resource strain: Not on file  .  Food insecurity:    Worry: Not on file    Inability: Not on file  . Transportation needs:    Medical: Not on file    Non-medical: Not on file  Tobacco Use  . Smoking status: Never Smoker  .  Smokeless tobacco: Never Used  Substance and Sexual Activity  . Alcohol use: Yes    Alcohol/week: 4.8 oz    Types: 8 Standard drinks or equivalent per week  . Drug use: No  . Sexual activity: Not on file  Lifestyle  . Physical activity:    Days per week: Not on file    Minutes per session: Not on file  . Stress: Not on file  Relationships  . Social connections:    Talks on phone: Not on file    Gets together: Not on file    Attends religious service: Not on file    Active member of club or organization: Not on file    Attends meetings of clubs or organizations: Not on file    Relationship status: Not on file  . Intimate partner violence:    Fear of current or ex partner: Not on file    Emotionally abused: Not on file    Physically abused: Not on file    Forced sexual activity: Not on file  Other Topics Concern  . Not on file  Social History Narrative   Not sure about a living will or health care POA   Wife should make health care decisions for him--then daughter, then son   Would accept resuscitation attempts   Not sure about tube feeds   Review of Systems  Appetite is fine Weight has come back up a bit---lost 15# with the illness Sleeping okay till last night Has kept up with his xolair Has been off MTX for a month or so---needs clearance to resume    Objective:   Physical Exam  Constitutional: He appears well-developed. No distress.  Neck: No thyromegaly present.  Cardiovascular: Normal rate and regular rhythm. Exam reveals no gallop.  Soft systolic murmur  Respiratory:  Decreased breath sounds but not tight or wheezy Mild rib pain with anterior compression  Musculoskeletal:  1+ calf edema now  Lymphadenopathy:    He has no cervical adenopathy.             Assessment & Plan:

## 2018-05-17 NOTE — Assessment & Plan Note (Signed)
Sodium was worse in hospital Likely exacerbated by the pneumonia Back on salt tablets but not furosemide Will recheck---probably need to stop the salt (or at least restart the furosemide) Is going to Dr Holley Raring next week

## 2018-05-17 NOTE — Patient Instructions (Signed)
Okay to restart the methotrexate. Restart the lasix (furosemide) for now ----if your sodium is up, I will have you stop the salt tablets.

## 2018-05-17 NOTE — Assessment & Plan Note (Signed)
Bacterial was presumed but may have also been viral Repeat CXR showed atelectasis but infiltrate seems to have resolved No action for now

## 2018-05-17 NOTE — Assessment & Plan Note (Signed)
Pneumonia is resolved Didn't really have a UTI based on the culture in a catheterized patient---so okay to restart the MTX

## 2018-05-18 ENCOUNTER — Ambulatory Visit (INDEPENDENT_AMBULATORY_CARE_PROVIDER_SITE_OTHER): Payer: Medicare PPO

## 2018-05-18 DIAGNOSIS — M545 Low back pain: Secondary | ICD-10-CM | POA: Diagnosis not present

## 2018-05-18 DIAGNOSIS — I10 Essential (primary) hypertension: Secondary | ICD-10-CM | POA: Diagnosis not present

## 2018-05-18 DIAGNOSIS — J45909 Unspecified asthma, uncomplicated: Secondary | ICD-10-CM | POA: Diagnosis not present

## 2018-05-18 DIAGNOSIS — M25512 Pain in left shoulder: Secondary | ICD-10-CM | POA: Diagnosis not present

## 2018-05-18 DIAGNOSIS — M25511 Pain in right shoulder: Secondary | ICD-10-CM | POA: Diagnosis not present

## 2018-05-18 DIAGNOSIS — J4550 Severe persistent asthma, uncomplicated: Secondary | ICD-10-CM

## 2018-05-18 DIAGNOSIS — F329 Major depressive disorder, single episode, unspecified: Secondary | ICD-10-CM | POA: Diagnosis not present

## 2018-05-18 DIAGNOSIS — N319 Neuromuscular dysfunction of bladder, unspecified: Secondary | ICD-10-CM | POA: Diagnosis not present

## 2018-05-18 DIAGNOSIS — I2510 Atherosclerotic heart disease of native coronary artery without angina pectoris: Secondary | ICD-10-CM | POA: Diagnosis not present

## 2018-05-18 DIAGNOSIS — G8929 Other chronic pain: Secondary | ICD-10-CM | POA: Diagnosis not present

## 2018-05-21 DIAGNOSIS — R600 Localized edema: Secondary | ICD-10-CM | POA: Diagnosis not present

## 2018-05-21 DIAGNOSIS — D649 Anemia, unspecified: Secondary | ICD-10-CM | POA: Diagnosis not present

## 2018-05-21 DIAGNOSIS — E871 Hypo-osmolality and hyponatremia: Secondary | ICD-10-CM | POA: Diagnosis not present

## 2018-05-21 MED ORDER — OMALIZUMAB 150 MG ~~LOC~~ SOLR
375.0000 mg | Freq: Once | SUBCUTANEOUS | Status: AC
  Administered 2018-05-18: 375 mg via SUBCUTANEOUS

## 2018-05-21 NOTE — Progress Notes (Signed)
Pt is buy and bill not spec pharmacy-hence the charge for 75 units replaced.

## 2018-05-22 DIAGNOSIS — F329 Major depressive disorder, single episode, unspecified: Secondary | ICD-10-CM | POA: Diagnosis not present

## 2018-05-22 DIAGNOSIS — M25511 Pain in right shoulder: Secondary | ICD-10-CM | POA: Diagnosis not present

## 2018-05-22 DIAGNOSIS — M545 Low back pain: Secondary | ICD-10-CM | POA: Diagnosis not present

## 2018-05-22 DIAGNOSIS — N319 Neuromuscular dysfunction of bladder, unspecified: Secondary | ICD-10-CM | POA: Diagnosis not present

## 2018-05-22 DIAGNOSIS — I2510 Atherosclerotic heart disease of native coronary artery without angina pectoris: Secondary | ICD-10-CM | POA: Diagnosis not present

## 2018-05-22 DIAGNOSIS — G8929 Other chronic pain: Secondary | ICD-10-CM | POA: Diagnosis not present

## 2018-05-22 DIAGNOSIS — M25512 Pain in left shoulder: Secondary | ICD-10-CM | POA: Diagnosis not present

## 2018-05-22 DIAGNOSIS — J45909 Unspecified asthma, uncomplicated: Secondary | ICD-10-CM | POA: Diagnosis not present

## 2018-05-22 DIAGNOSIS — I10 Essential (primary) hypertension: Secondary | ICD-10-CM | POA: Diagnosis not present

## 2018-05-23 ENCOUNTER — Telehealth: Payer: Self-pay | Admitting: Pulmonary Disease

## 2018-05-23 DIAGNOSIS — G8929 Other chronic pain: Secondary | ICD-10-CM | POA: Diagnosis not present

## 2018-05-23 DIAGNOSIS — M545 Low back pain: Secondary | ICD-10-CM | POA: Diagnosis not present

## 2018-05-23 DIAGNOSIS — I2510 Atherosclerotic heart disease of native coronary artery without angina pectoris: Secondary | ICD-10-CM | POA: Diagnosis not present

## 2018-05-23 DIAGNOSIS — N319 Neuromuscular dysfunction of bladder, unspecified: Secondary | ICD-10-CM | POA: Diagnosis not present

## 2018-05-23 DIAGNOSIS — M25511 Pain in right shoulder: Secondary | ICD-10-CM | POA: Diagnosis not present

## 2018-05-23 DIAGNOSIS — J45909 Unspecified asthma, uncomplicated: Secondary | ICD-10-CM | POA: Diagnosis not present

## 2018-05-23 DIAGNOSIS — F329 Major depressive disorder, single episode, unspecified: Secondary | ICD-10-CM | POA: Diagnosis not present

## 2018-05-23 DIAGNOSIS — I10 Essential (primary) hypertension: Secondary | ICD-10-CM | POA: Diagnosis not present

## 2018-05-23 DIAGNOSIS — M25512 Pain in left shoulder: Secondary | ICD-10-CM | POA: Diagnosis not present

## 2018-05-24 DIAGNOSIS — M25512 Pain in left shoulder: Secondary | ICD-10-CM | POA: Diagnosis not present

## 2018-05-24 DIAGNOSIS — M25511 Pain in right shoulder: Secondary | ICD-10-CM | POA: Diagnosis not present

## 2018-05-24 DIAGNOSIS — J45909 Unspecified asthma, uncomplicated: Secondary | ICD-10-CM | POA: Diagnosis not present

## 2018-05-24 DIAGNOSIS — I10 Essential (primary) hypertension: Secondary | ICD-10-CM | POA: Diagnosis not present

## 2018-05-24 DIAGNOSIS — F329 Major depressive disorder, single episode, unspecified: Secondary | ICD-10-CM | POA: Diagnosis not present

## 2018-05-24 DIAGNOSIS — I2510 Atherosclerotic heart disease of native coronary artery without angina pectoris: Secondary | ICD-10-CM | POA: Diagnosis not present

## 2018-05-24 DIAGNOSIS — G8929 Other chronic pain: Secondary | ICD-10-CM | POA: Diagnosis not present

## 2018-05-24 DIAGNOSIS — M545 Low back pain: Secondary | ICD-10-CM | POA: Diagnosis not present

## 2018-05-24 DIAGNOSIS — N319 Neuromuscular dysfunction of bladder, unspecified: Secondary | ICD-10-CM | POA: Diagnosis not present

## 2018-05-24 NOTE — Telephone Encounter (Signed)
Prefilled Syringes: # 150mg  4  #75mg  2 Arrival Date: 05/24/18 Lot #: 150mg  [id number]      75mg  [id number] Exp Date: 150mg  11/2018   75mg  12/2018

## 2018-05-25 ENCOUNTER — Observation Stay
Admission: EM | Admit: 2018-05-25 | Discharge: 2018-05-26 | Disposition: A | Discharge status: Home / Self Care | Payer: Medicare PPO | Attending: Internal Medicine | Admitting: Internal Medicine

## 2018-05-25 ENCOUNTER — Other Ambulatory Visit: Payer: Self-pay

## 2018-05-25 ENCOUNTER — Emergency Department: Payer: Medicare PPO

## 2018-05-25 DIAGNOSIS — R262 Difficulty in walking, not elsewhere classified: Secondary | ICD-10-CM | POA: Diagnosis not present

## 2018-05-25 DIAGNOSIS — K589 Irritable bowel syndrome without diarrhea: Secondary | ICD-10-CM | POA: Insufficient documentation

## 2018-05-25 DIAGNOSIS — M069 Rheumatoid arthritis, unspecified: Secondary | ICD-10-CM | POA: Diagnosis not present

## 2018-05-25 DIAGNOSIS — F411 Generalized anxiety disorder: Secondary | ICD-10-CM | POA: Insufficient documentation

## 2018-05-25 DIAGNOSIS — R4781 Slurred speech: Secondary | ICD-10-CM | POA: Insufficient documentation

## 2018-05-25 DIAGNOSIS — K219 Gastro-esophageal reflux disease without esophagitis: Secondary | ICD-10-CM | POA: Insufficient documentation

## 2018-05-25 DIAGNOSIS — Z882 Allergy status to sulfonamides status: Secondary | ICD-10-CM | POA: Insufficient documentation

## 2018-05-25 DIAGNOSIS — J329 Chronic sinusitis, unspecified: Secondary | ICD-10-CM | POA: Diagnosis not present

## 2018-05-25 DIAGNOSIS — Z79899 Other long term (current) drug therapy: Secondary | ICD-10-CM | POA: Diagnosis not present

## 2018-05-25 DIAGNOSIS — Z7951 Long term (current) use of inhaled steroids: Secondary | ICD-10-CM | POA: Insufficient documentation

## 2018-05-25 DIAGNOSIS — Q331 Accessory lobe of lung: Secondary | ICD-10-CM | POA: Diagnosis not present

## 2018-05-25 DIAGNOSIS — Z86010 Personal history of colonic polyps: Secondary | ICD-10-CM | POA: Diagnosis not present

## 2018-05-25 DIAGNOSIS — F329 Major depressive disorder, single episode, unspecified: Secondary | ICD-10-CM | POA: Diagnosis not present

## 2018-05-25 DIAGNOSIS — Z7982 Long term (current) use of aspirin: Secondary | ICD-10-CM | POA: Insufficient documentation

## 2018-05-25 DIAGNOSIS — E785 Hyperlipidemia, unspecified: Secondary | ICD-10-CM | POA: Insufficient documentation

## 2018-05-25 DIAGNOSIS — I7389 Other specified peripheral vascular diseases: Secondary | ICD-10-CM | POA: Insufficient documentation

## 2018-05-25 DIAGNOSIS — Z8546 Personal history of malignant neoplasm of prostate: Secondary | ICD-10-CM | POA: Diagnosis not present

## 2018-05-25 DIAGNOSIS — I452 Bifascicular block: Secondary | ICD-10-CM | POA: Diagnosis not present

## 2018-05-25 DIAGNOSIS — R29818 Other symptoms and signs involving the nervous system: Secondary | ICD-10-CM | POA: Diagnosis not present

## 2018-05-25 DIAGNOSIS — R4701 Aphasia: Secondary | ICD-10-CM | POA: Diagnosis present

## 2018-05-25 DIAGNOSIS — Z881 Allergy status to other antibiotic agents status: Secondary | ICD-10-CM | POA: Insufficient documentation

## 2018-05-25 DIAGNOSIS — G459 Transient cerebral ischemic attack, unspecified: Principal | ICD-10-CM | POA: Insufficient documentation

## 2018-05-25 DIAGNOSIS — Z4682 Encounter for fitting and adjustment of non-vascular catheter: Secondary | ICD-10-CM | POA: Diagnosis not present

## 2018-05-25 DIAGNOSIS — Z8673 Personal history of transient ischemic attack (TIA), and cerebral infarction without residual deficits: Secondary | ICD-10-CM | POA: Insufficient documentation

## 2018-05-25 DIAGNOSIS — I2510 Atherosclerotic heart disease of native coronary artery without angina pectoris: Secondary | ICD-10-CM | POA: Insufficient documentation

## 2018-05-25 DIAGNOSIS — N319 Neuromuscular dysfunction of bladder, unspecified: Secondary | ICD-10-CM | POA: Diagnosis not present

## 2018-05-25 DIAGNOSIS — G4733 Obstructive sleep apnea (adult) (pediatric): Secondary | ICD-10-CM | POA: Insufficient documentation

## 2018-05-25 DIAGNOSIS — I700 Atherosclerosis of aorta: Secondary | ICD-10-CM | POA: Diagnosis not present

## 2018-05-25 DIAGNOSIS — I10 Essential (primary) hypertension: Secondary | ICD-10-CM | POA: Diagnosis not present

## 2018-05-25 DIAGNOSIS — E871 Hypo-osmolality and hyponatremia: Secondary | ICD-10-CM | POA: Diagnosis not present

## 2018-05-25 DIAGNOSIS — Z888 Allergy status to other drugs, medicaments and biological substances status: Secondary | ICD-10-CM | POA: Insufficient documentation

## 2018-05-25 LAB — URINALYSIS, ROUTINE W REFLEX MICROSCOPIC
BILIRUBIN URINE: NEGATIVE
Glucose, UA: NEGATIVE mg/dL
KETONES UR: NEGATIVE mg/dL
Nitrite: NEGATIVE
PH: 8 (ref 5.0–8.0)
PROTEIN: NEGATIVE mg/dL
Specific Gravity, Urine: 1.009 (ref 1.005–1.030)

## 2018-05-25 LAB — DIFFERENTIAL
BASOS PCT: 1 %
Basophils Absolute: 0 10*3/uL (ref 0–0.1)
EOS ABS: 0.3 10*3/uL (ref 0–0.7)
EOS PCT: 6 %
LYMPHS ABS: 0.8 10*3/uL — AB (ref 1.0–3.6)
Lymphocytes Relative: 18 %
Monocytes Absolute: 0.4 10*3/uL (ref 0.2–1.0)
Monocytes Relative: 8 %
NEUTROS PCT: 67 %
Neutro Abs: 3.2 10*3/uL (ref 1.4–6.5)

## 2018-05-25 LAB — CBC
HCT: 35.6 % — ABNORMAL LOW (ref 40.0–52.0)
HEMOGLOBIN: 12.6 g/dL — AB (ref 13.0–18.0)
MCH: 32.9 pg (ref 26.0–34.0)
MCHC: 35.4 g/dL (ref 32.0–36.0)
MCV: 93.1 fL (ref 80.0–100.0)
PLATELETS: 210 10*3/uL (ref 150–440)
RBC: 3.82 MIL/uL — AB (ref 4.40–5.90)
RDW: 14.6 % — ABNORMAL HIGH (ref 11.5–14.5)
WBC: 4.8 10*3/uL (ref 3.8–10.6)

## 2018-05-25 LAB — COMPREHENSIVE METABOLIC PANEL
ALBUMIN: 3.7 g/dL (ref 3.5–5.0)
ALT: 14 U/L (ref 0–44)
ANION GAP: 7 (ref 5–15)
AST: 25 U/L (ref 15–41)
Alkaline Phosphatase: 52 U/L (ref 38–126)
BUN: 13 mg/dL (ref 8–23)
CHLORIDE: 96 mmol/L — AB (ref 98–111)
CO2: 29 mmol/L (ref 22–32)
Calcium: 8.8 mg/dL — ABNORMAL LOW (ref 8.9–10.3)
Creatinine, Ser: 0.67 mg/dL (ref 0.61–1.24)
GFR calc Af Amer: >60 mL/min (ref >60)
Glucose, Bld: 124 mg/dL — ABNORMAL HIGH (ref 70–99)
Potassium: 3.9 mmol/L (ref 3.5–5.1)
Sodium: 132 mmol/L — ABNORMAL LOW (ref 135–145)
Total Bilirubin: 0.8 mg/dL (ref 0.3–1.2)
Total Protein: 6.8 g/dL (ref 6.5–8.1)

## 2018-05-25 LAB — ETHANOL: Alcohol, Ethyl (B): <10 mg/dL (ref <10)

## 2018-05-25 LAB — PROTIME-INR
INR: 1.07
PROTHROMBIN TIME: 13.8 s (ref 11.4–15.2)

## 2018-05-25 LAB — GLUCOSE, CAPILLARY: Glucose-Capillary: 133 mg/dL — ABNORMAL HIGH (ref 70–99)

## 2018-05-25 LAB — TROPONIN I: Troponin I: <0.03 ng/mL (ref <0.03)

## 2018-05-25 LAB — APTT: aPTT: 31 seconds (ref 24–36)

## 2018-05-25 MED ORDER — PRAVASTATIN SODIUM 20 MG PO TABS
20.0000 mg | ORAL_TABLET | Freq: Every day | ORAL | Status: DC
  Administered 2018-05-26: 20 mg via ORAL
  Filled 2018-05-25: qty 1

## 2018-05-25 MED ORDER — PANTOPRAZOLE SODIUM 40 MG PO TBEC
40.0000 mg | DELAYED_RELEASE_TABLET | Freq: Every day | ORAL | Status: DC
  Administered 2018-05-26: 10:00:00 40 mg via ORAL
  Filled 2018-05-25: qty 1

## 2018-05-25 MED ORDER — ACETAMINOPHEN 650 MG RE SUPP: 650.0000 mg | RECTAL | Status: DC | PRN

## 2018-05-25 MED ORDER — ALPRAZOLAM 0.5 MG PO TABS
0.5000 mg | ORAL_TABLET | Freq: Every day | ORAL | Status: DC | PRN
  Administered 2018-05-26: 0.5 mg via ORAL
  Filled 2018-05-25: qty 1

## 2018-05-25 MED ORDER — HYDROXYCHLOROQUINE SULFATE 200 MG PO TABS
200.0000 mg | ORAL_TABLET | Freq: Two times a day (BID) | ORAL | Status: DC
  Administered 2018-05-25 – 2018-05-26 (×2): 200 mg via ORAL
  Filled 2018-05-25 (×3): qty 1

## 2018-05-25 MED ORDER — STROKE: EARLY STAGES OF RECOVERY BOOK
Freq: Once | Status: AC
  Administered 2018-05-25: 23:00:00

## 2018-05-25 MED ORDER — TRAMADOL HCL 50 MG PO TABS: 50.0000 mg | ORAL_TABLET | Freq: Four times a day (QID) | ORAL | Status: DC | PRN

## 2018-05-25 MED ORDER — ACETAMINOPHEN 160 MG/5ML PO SOLN
650.0000 mg | ORAL | Status: DC | PRN
  Filled 2018-05-25: qty 20.3

## 2018-05-25 MED ORDER — ENOXAPARIN SODIUM 40 MG/0.4ML ~~LOC~~ SOLN
40.0000 mg | SUBCUTANEOUS | Status: DC
  Administered 2018-05-25: 23:00:00 40 mg via SUBCUTANEOUS
  Filled 2018-05-25: qty 0.4

## 2018-05-25 MED ORDER — METHOTREXATE 2.5 MG PO TABS: 25.0000 mg | ORAL_TABLET | ORAL | Status: DC

## 2018-05-25 MED ORDER — ASPIRIN EC 81 MG PO TBEC
81.0000 mg | DELAYED_RELEASE_TABLET | Freq: Every day | ORAL | Status: DC
Start: 2018-05-26 — End: 2018-05-26

## 2018-05-25 MED ORDER — ASPIRIN 81 MG PO CHEW
324.0000 mg | CHEWABLE_TABLET | Freq: Once | ORAL | Status: AC
  Administered 2018-05-25: 324 mg via ORAL
  Filled 2018-05-25: qty 4

## 2018-05-25 MED ORDER — ACETAMINOPHEN 325 MG PO TABS
650.0000 mg | ORAL_TABLET | ORAL | Status: DC | PRN
  Administered 2018-05-26: 06:00:00 650 mg via ORAL
  Filled 2018-05-25: qty 2

## 2018-05-25 NOTE — ED Notes (Signed)
Sam RN, aware of bed assigned  

## 2018-05-25 NOTE — ED Notes (Signed)
Patient given a warm blanket at this time.  

## 2018-05-25 NOTE — ED Notes (Signed)
Called MRI to let them know patient ready for transport to the floor. MRI tech reported he would get patient transported to floor bed. Patient's family updated on plan of care. Patient's daughter took patient's clothing and walker.

## 2018-05-25 NOTE — ED Notes (Signed)
BS 133

## 2018-05-25 NOTE — ED Triage Notes (Addendum)
Pt comes via POV from home with c/o of slurred speech. Per family pt was at doctors office at 5:30 today and pt began having trouble finding his words. Daughter states that it went on for about 20 minutes before resolving. Daughter immediatly noticed and brought pt in. Pt currently answering all questions and MD at bedside.

## 2018-05-25 NOTE — ED Notes (Signed)
MD Quentin Cornwall laid eyes on pt before heading to CT with RN Raquel.

## 2018-05-25 NOTE — Consult Note (Signed)
   [facility] TeleNeurology Consult Services  Impression:  Transient aphasia.  Suspect left hemispheric TIA.   Not a tpa candidate due to: resolved Not an NIR candidate due to: resolved   Differential Diagnosis:   1. Cardioembolic stroke  2. Small vessel disease/lacune  3. Thromboembolic, artery-to-artery mechanism  4. Hypercoagulable state-related infarct  5. Transient ischemic attack  6. Thrombotic mechanism, large artery disease   Comments:   Door time: 1938 [facility] contacted: 1954 [facility] at bedside: 1957 NIHSS assessment time: 1959  Recommendations:  ASA MRI brain wo CTA head/neck TTE Statin Normotension Tele monitoring inpatient neurology consultation Inpatient stroke evaluation as per Neurology/ Internal Medicine Discussed with ED MD  -----------------------------------------------------------------------------------------  CC: stroke alert  History of Present Illness  Patient is an 84 year old man with a history of HTN who presented with transient language change.  Last normal at 1730, then he experienced 20 mins of expressive aphasia.  He states he knew what he wanted to say but couldn;t get it out.  No other symptoms with this including weakness, sensory loss, vision change, gait change, incoordination, HA, CP.  Diagnostic: CT head wo - nothing acute  Exam: NIHSS score: 0 1a LOC: 0  1b Questions: 0 1c Commands: 0  2 Gaze: 0  3 VF: 0  4 Face: 0  5a Motor arm left: 0  5b Motor arm right: 0  6a Motor leg left: 0  6b Motor leg right: 0  7 Ataxia: 0  8 Sensory: 0  9: Language: 0 10: Speech: 0 11: Extinction: 0       Medical Decision Making:  - Extensive number of diagnosis or management options are considered above.   - Extensive amount of complex data reviewed.   - High risk of complication and/or morbidity or mortality are associated with differential diagnostic considerations above.  - There may be Uncertain outcome and  increased probability of prolonged functional impairment or high probability of severe prolonged functional impairment associated with some of these differential diagnosis.   Medical Data Reviewed:  1.Data reviewed include clinical labs, radiology,  Medical Tests;   2.Tests results discussed w/performing or interpreting physician;   3.Obtaining/reviewing old medical records;  4.Obtaining case history from another source;  5.Independent review of image, tracing or specimen.    Patient was informed the Neurology Consult would happen via TeleHealth consult by way of interactive audio and video telecommunications and consented to receiving care in this manner.

## 2018-05-25 NOTE — ED Notes (Signed)
Neurologist called at this time. Code Stroke per MD Quentin Cornwall

## 2018-05-25 NOTE — ED Provider Notes (Signed)
[facility] Emergency Department Provider Note    First MD Initiated Contact with Patient 05/25/18 1938     (approximate)  I have reviewed the triage vital signs and the nursing notes.   HISTORY  Chief Complaint Aphasia    HPI [name] is a 84 y.o. male has a past medical history as listed below presents to the ER with slurred speech and difficulty making words that occurred around 2 hours prior to arrival.  This was witnessed by patient's daughter.  Is never had episodes like this happen before.  Letter symptoms lasted roughly 15 to 50minutes.  Slowly improving.  Patient asymptomatic at this time no headaches.  Does take a baby aspirin.  Denies any history of stroke.  Denies any weakness.  No recent medication changes but is currently on antibiotics for UTI.    Past Medical History:  Diagnosis Date  . Allergy   . Anemia   . Anxiety   . Arthritis    rheumatoid  . Asthma   . CAD (coronary artery disease)   . Cancer [facility])    Prostate  . Chronic sinusitis   . Depression   . Diverticulitis   . ED (erectile dysfunction)   . GERD (gastroesophageal reflux disease)   . History of SIADH   . Hx of adenomatous colonic polyps   . Hypertension   . Hyponatremia   . IBS (irritable bowel syndrome)   . Neurodermatitis   . Neurogenic bladder   . OSA (obstructive sleep apnea)    Family History  Problem Relation Age of Onset  . Heart disease Mother 18       heart failure  . Pneumonia Father 32       pnemonia  . Colon cancer Neg Hx    Past Surgical History:  Procedure Laterality Date  . KNEE ARTHROPLASTY Right 09/02/2015   Procedure: COMPUTER ASSISTED TOTAL KNEE ARTHROPLASTY;  Surgeon: [name], MD;  Location: [facility] ORS;  Service: Orthopedics;  Laterality: Right;  . KNEE SURGERY Left    ARTHROSCOPY LEFT  . NASAL SINUS SURGERY  2009   DEVIATED SEPTUM AND POLYPS  . PROSTATE SURGERY     PROSTATECTOMY  . TOTAL KNEE ARTHROPLASTY Left 9/15   Dr  [name]  . URETHRAL STRICTURE DILATATION  02-2010   Dr.Cope   Patient Active Problem List   Diagnosis Date Noted  . Complicated UTI (urinary tract infection) 10/18/2017  . SIADH (syndrome of inappropriate ADH production) ([place]) 04/17/2017  . Hyponatremia 01/16/2017  . Asthma with acute exacerbation 11/14/2016  . Lobar pneumonia ([place]) 11/14/2016  . Mood disorder ([place]) 01/18/2016  . Presence of indwelling urinary catheter 08/20/2015  . Advanced directives, counseling/discussion 07/28/2014  . Routine general medical examination at a health care facility 04/09/2012  . History of prostate cancer 04/09/2012  . Venous stasis dermatitis 06/13/2011  . Chronic sinusitis   . CONSTIPATION, CHRONIC 12/07/2010  . Hyperlipemia 12/02/2010  . OSTEOARTHRITIS 12/02/2010  . NEUROGENIC BLADDER 05/24/2010  . IBS 05/05/2010  . PERSONAL HISTORY OF COLONIC POLYPS 05/05/2010  . NEUROPATHY 05/03/2010  . Essential hypertension, benign 03/25/2009  . GAD (generalized anxiety disorder) 01/27/2007  . Coronary atherosclerosis of native coronary artery 01/27/2007  . Allergic asthma 01/27/2007  . GERD 01/27/2007  . Chronic rheumatic arthritis ([place]) 01/27/2007      Prior to Admission medications   Medication Sig Start Date End Date Taking? Authorizing Provider  aspirin 81 MG tablet Take 81 mg by mouth daily.  Yes [provider]  albuterol (PROVENTIL HFA;VENTOLIN HFA) 108 (90 Base) MCG/ACT inhaler Inhale 2 puffs into the lungs every 6 (six) hours as needed for wheezing or shortness of breath. 11/14/16   [name], MD  ALPRAZolam Duanne Moron) 0.5 MG tablet TAKE 1 TABLET BY MOUTH THREE TIMES DAILY AS NEEDED FOR ANXIETY OR SLEEP 04/13/18   Tower, Wynelle Fanny, MD  Cholecalciferol (VITAMIN D3) 5000 UNITS CAPS Take 1 capsule by mouth daily.    [provider]  Coenzyme Q10 (CO Q 10) 100 MG CAPS Take 1 capsule by mouth daily.    [provider]  Cranberry 425 MG CAPS Take 2 capsules by mouth  daily.    [provider]  fluticasone (FLONASE) 50 MCG/ACT nasal spray Place 2 sprays into both nostrils 2 (two) times daily. 04/27/17   [name], MD  folic acid (FOLVITE) 1 MG tablet Take 1 tablet (1 mg total) by mouth daily. 04/27/17   [name], MD  furosemide (LASIX) 20 MG tablet TAKE 1 TABLET BY MOUTH EVERY DAY Patient taking differently: TAKE 1 TABLET BY mouth 3 times a week 09/19/17   [name], MD  gabapentin (NEURONTIN) 300 MG capsule TAKE 1 CAPSULE BY MOUTH THREE TIMES DAILY 07/12/16   [name], MD  hydroxychloroquine (PLAQUENIL) 200 MG tablet Take 1 tablet by mouth 2 (two) times daily. 01/22/16   [provider]  methotrexate (RHEUMATREX) 2.5 MG tablet Take 10 tablets by mouth once a week. 01/07/16   [provider]  Milk Thistle 250 MG CAPS Take 1 capsule by mouth daily.    [provider]  Multiple Vitamin (MULTIVITAMIN) tablet Take 1 tablet by mouth daily.      [provider]  nystatin (MYCOSTATIN/NYSTOP) powder Apply topically 4 (four) times daily. 05/17/18   [name], MD  omeprazole (PRILOSEC) 20 MG capsule Take 1 capsule (20 mg total) by mouth daily as needed. 01/01/18   [name], MD  polyethylene glycol [facility]) packet Take 34 g by mouth daily.     [provider]  pravastatin (PRAVACHOL) 20 MG tablet TAKE 1 TABLET BY MOUTH EVERY DAY 05/29/17   [name], MD  Probiotic Product (ALIGN) 4 MG CAPS Take 1 capsule (4 mg total) by mouth daily. 12/15/16   Nicholes Mango, MD  Selenium 200 MCG CAPS Take 1 capsule by mouth daily.    [provider]  traMADol (ULTRAM) 50 MG tablet Take 1 tablet (50 mg total) 4 (four) times daily as needed by mouth. 10/10/17   [name], MD  Turmeric 500 MG CAPS Take 500 mg by mouth daily.    [provider]  vitamin B-12 (CYANOCOBALAMIN) 500 MCG tablet Take 500 mcg by mouth daily.    [provider]     Allergies Ciprofloxacin; Citalopram hydrobromide; Lorazepam; Paroxetine; Ramipril; Simvastatin; and Sulfa antibiotics    Social History Social History   Tobacco Use  . Smoking status: Never Smoker  . Smokeless tobacco: Never Used  Substance Use Topics  . Alcohol use: Yes    Alcohol/week: 4.8 oz    Types: 8 Standard drinks or equivalent per week  . Drug use: No    Review of Systems Patient denies headaches, rhinorrhea, blurry vision, numbness, shortness of breath, chest pain, edema, cough, abdominal pain, nausea, vomiting, diarrhea, dysuria, fevers, rashes or hallucinations unless otherwise stated above in HPI. ____________________________________________   PHYSICAL EXAM:  VITAL SIGNS: Vitals:   05/25/18  1945 05/25/18 2019  BP:    Pulse: 84   Resp: 18   Temp:  97.7 F (36.5 C)  SpO2: 99%     Constitutional: Alert and oriented.  Eyes: Conjunctivae are normal.  Head: Atraumatic. Nose: No congestion/rhinnorhea. Mouth/Throat: Mucous membranes are moist.   Neck: No stridor. Painless ROM.  Cardiovascular: Normal rate, regular rhythm. Grossly normal heart sounds.  Good peripheral circulation. Respiratory: Normal respiratory effort.  No retractions. Lungs CTAB. Gastrointestinal: Soft and nontender. No distention. No abdominal bruits. No CVA tenderness. Genitourinary:  Musculoskeletal: No lower extremity tenderness nor edema.  No joint effusions. Neurologic: CN- intact.  No facial droop, Normal FNF.  Normal heel to shin.  Sensation intact bilaterally. Normal speech and language. No gross focal neurologic deficits are appreciated. No gait instability. Skin:  Skin is warm, dry and intact. No rash noted. Psychiatric: Mood and affect are normal. Speech and behavior are normal.  ____________________________________________   LABS (all labs ordered are listed, but only abnormal results are displayed)  Results for orders placed or performed during the hospital encounter  of 05/25/18 (from the past 24 hour(s))  Ethanol     Status: None   Collection Time: 05/25/18  7:53 PM  Result Value Ref Range   Alcohol, Ethyl (B) <10 <10 mg/dL  Protime-INR     Status: None   Collection Time: 05/25/18  7:53 PM  Result Value Ref Range   Prothrombin Time 13.8 11.4 - 15.2 seconds   INR 1.07   APTT     Status: None   Collection Time: 05/25/18  7:53 PM  Result Value Ref Range   aPTT 31 24 - 36 seconds  CBC     Status: Abnormal   Collection Time: 05/25/18  7:53 PM  Result Value Ref Range   WBC 4.8 3.8 - 10.6 K/uL   RBC 3.82 (L) 4.40 - 5.90 MIL/uL   Hemoglobin 12.6 (L) 13.0 - 18.0 g/dL   HCT 35.6 (L) 40.0 - 52.0 %   MCV 93.1 80.0 - 100.0 fL   MCH 32.9 26.0 - 34.0 pg   MCHC 35.4 32.0 - 36.0 g/dL   RDW 14.6 (H) 11.5 - 14.5 %   Platelets 210 150 - 440 K/uL  Differential     Status: Abnormal   Collection Time: 05/25/18  7:53 PM  Result Value Ref Range   Neutrophils Relative % 67 %   Neutro Abs 3.2 1.4 - 6.5 K/uL   Lymphocytes Relative 18 %   Lymphs Abs 0.8 (L) 1.0 - 3.6 K/uL   Monocytes Relative 8 %   Monocytes Absolute 0.4 0.2 - 1.0 K/uL   Eosinophils Relative 6 %   Eosinophils Absolute 0.3 0 - 0.7 K/uL   Basophils Relative 1 %   Basophils Absolute 0.0 0 - 0.1 K/uL  Comprehensive metabolic panel     Status: Abnormal   Collection Time: 05/25/18  7:53 PM  Result Value Ref Range   Sodium 132 (L) 135 - 145 mmol/L   Potassium 3.9 3.5 - 5.1 mmol/L   Chloride 96 (L) 98 - 111 mmol/L   CO2 29 22 - 32 mmol/L   Glucose, Bld 124 (H) 70 - 99 mg/dL   BUN 13 8 - 23 mg/dL   Creatinine, Ser 0.67 0.61 - 1.24 mg/dL   Calcium 8.8 (L) 8.9 - 10.3 mg/dL   Total Protein 6.8 6.5 - 8.1 g/dL   Albumin 3.7 3.5 - 5.0 g/dL   AST 25 15 - 41 U/L  ALT 14 0 - 44 U/L   Alkaline Phosphatase 52 38 - 126 U/L   Total Bilirubin 0.8 0.3 - 1.2 mg/dL   GFR calc non Af Amer >60 >60 mL/min   GFR calc Af Amer >60 >60 mL/min   Anion gap 7 5 - 15  Troponin I     Status: None   Collection Time:  05/25/18  7:53 PM  Result Value Ref Range   Troponin I <0.03 <0.03 ng/mL  Glucose, capillary     Status: Abnormal   Collection Time: 05/25/18  7:55 PM  Result Value Ref Range   Glucose-Capillary 133 (H) 70 - 99 mg/dL   ____________________________________________  EKG My review and personal interpretation at Time: 19:47   Indication: aphasia  Rate: 80  Rhythm: sinus Axis: normal Other: no stemi, rbbb ____________________________________________  RADIOLOGY  I personally reviewed all radiographic images ordered to evaluate for the above acute complaints and reviewed radiology reports and findings.  These findings were personally discussed with the patient.  Please see medical record for radiology report.  ____________________________________________   PROCEDURES  Procedure(s) performed:  Procedures    Critical Care performed: no ____________________________________________   INITIAL IMPRESSION / ASSESSMENT AND PLAN / ED COURSE  Pertinent labs & imaging results that were available during my care of the patient were reviewed by me and considered in my medical decision making (see chart for details).   DDX: cva, tia, hypoglycemia, dehydration, electrolyte abnormality, dissection, sepsis   LEGEND TUMMINELLO is a 84 y.o. who presents to the ED with expressive a aphasia as described above.  Patient afebrile mildly hypertensive.  Symptoms seem to have resolved.  Meet TPA criteria as symptoms resolving.  Blood work will be sent for the above differential.  CT head shows no acute abnormality.  Based on his presentation I do feel patient will require hospitalization for TIA work-up as he is high risk for cardiovascular disease and would benefit from ancillary testing and hemodynamic monitoring over night.        As part of my medical decision making, I reviewed the following data within the [facility] notes reviewed and incorporated, Labs reviewed, notes from  prior ED visits.  ____________________________________________   FINAL CLINICAL IMPRESSION(S) / ED DIAGNOSES  Final diagnoses:  Expressive aphasia  TIA (transient ischemic attack)      NEW MEDICATIONS STARTED DURING THIS VISIT:  New Prescriptions   No medications on file     Note:  This document was prepared using Dragon voice recognition software and may include unintentional dictation errors.    Merlyn Lot, MD 05/25/18 2107

## 2018-05-25 NOTE — H&P (Signed)
[facility] [place] NAME: [name]    MR#:  [id number]  DATE OF BIRTH:  1934/07/07  DATE OF ADMISSION:  05/25/2018  PRIMARY CARE PHYSICIAN: [name], MD   REQUESTING/REFERRING PHYSICIAN: Quentin Cornwall, MD  CHIEF COMPLAINT:   Chief Complaint  Patient presents with  . Aphasia    HISTORY OF PRESENT ILLNESS:  [name]  is a 83 y.o. male who presents with an episode of expressive aphasia that lasted a little more than an hour.  Patient's daughter provides much of the history and states that the patient was getting his haircut today when he developed significant expressive aphasia.  Patient states that he felt like he could not get the words out despite knowing what he wanted to say.  This lasted for some time until the patient was brought here to the ED for evaluation.  His symptoms resolved while here in the ED.  Initial work-up is largely within normal limits, though there is strong concern for possible stroke etiology.  Hospitalist were called for admission  PAST MEDICAL HISTORY:   Past Medical History:  Diagnosis Date  . Allergy   . Anemia   . Anxiety   . Arthritis    rheumatoid  . Asthma   . CAD (coronary artery disease)   . Cancer [facility])    Prostate  . Chronic sinusitis   . Depression   . Diverticulitis   . ED (erectile dysfunction)   . GERD (gastroesophageal reflux disease)   . History of SIADH   . Hx of adenomatous colonic polyps   . Hypertension   . Hyponatremia   . IBS (irritable bowel syndrome)   . Neurodermatitis   . Neurogenic bladder   . OSA (obstructive sleep apnea)      PAST SURGICAL HISTORY:   Past Surgical History:  Procedure Laterality Date  . KNEE ARTHROPLASTY Right 09/02/2015   Procedure: COMPUTER ASSISTED TOTAL KNEE ARTHROPLASTY;  Surgeon: [name], MD;  Location: [facility] ORS;  Service: Orthopedics;  Laterality: Right;  . KNEE SURGERY Left    ARTHROSCOPY LEFT  . NASAL SINUS SURGERY   2009   DEVIATED SEPTUM AND POLYPS  . PROSTATE SURGERY     PROSTATECTOMY  . TOTAL KNEE ARTHROPLASTY Left 9/15   Dr [name]  . URETHRAL STRICTURE DILATATION  02-2010   Dr.Cope     SOCIAL HISTORY:   Social History   Tobacco Use  . Smoking status: Never Smoker  . Smokeless tobacco: Never Used  Substance Use Topics  . Alcohol use: Yes    Alcohol/week: 4.8 oz    Types: 8 Standard drinks or equivalent per week     FAMILY HISTORY:   Family History  Problem Relation Age of Onset  . Heart disease Mother 65       heart failure  . Pneumonia Father 48       pnemonia  . Colon cancer Neg Hx      DRUG ALLERGIES:   Allergies  Allergen Reactions  . Ciprofloxacin Nausea And Vomiting    Headache  . Citalopram Hydrobromide Other (See Comments)    "unknown"  . Lorazepam     Adverse reaction  . Paroxetine Nausea Only  . Ramipril Other (See Comments)    "unknown"  . Simvastatin   . Sulfa Antibiotics Other (See Comments)    MEDICATIONS AT HOME:   Prior to Admission medications   Medication Sig Start Date End Date Taking? Authorizing Provider  aspirin 81 MG tablet Take 81 mg by mouth daily.   Yes [provider]  albuterol (PROVENTIL HFA;VENTOLIN HFA) 108 (90 Base) MCG/ACT inhaler Inhale 2 puffs into the lungs every 6 (six) hours as needed for wheezing or shortness of breath. 11/14/16   [name], MD  ALPRAZolam Duanne Moron) 0.5 MG tablet TAKE 1 TABLET BY MOUTH THREE TIMES DAILY AS NEEDED FOR ANXIETY OR SLEEP 04/13/18   Tower, Wynelle Fanny, MD  Cholecalciferol (VITAMIN D3) 5000 UNITS CAPS Take 1 capsule by mouth daily.    [provider]  Coenzyme Q10 (CO Q 10) 100 MG CAPS Take 1 capsule by mouth daily.    [provider]  Cranberry 425 MG CAPS Take 2 capsules by mouth daily.    [provider]  fluticasone (FLONASE) 50 MCG/ACT nasal spray Place 2 sprays into both nostrils 2 (two) times daily. 04/27/17   [name], MD  folic acid (FOLVITE)  1 MG tablet Take 1 tablet (1 mg total) by mouth daily. 04/27/17   [name], MD  furosemide (LASIX) 20 MG tablet TAKE 1 TABLET BY MOUTH EVERY DAY Patient taking differently: TAKE 1 TABLET BY mouth 3 times a week 09/19/17   [name], MD  gabapentin (NEURONTIN) 300 MG capsule TAKE 1 CAPSULE BY MOUTH THREE TIMES DAILY 07/12/16   [name], MD  hydroxychloroquine (PLAQUENIL) 200 MG tablet Take 1 tablet by mouth 2 (two) times daily. 01/22/16   [provider]  methotrexate (RHEUMATREX) 2.5 MG tablet Take 10 tablets by mouth once a week. 01/07/16   [provider]  Milk Thistle 250 MG CAPS Take 1 capsule by mouth daily.    [provider]  Multiple Vitamin (MULTIVITAMIN) tablet Take 1 tablet by mouth daily.      [provider]  nystatin (MYCOSTATIN/NYSTOP) powder Apply topically 4 (four) times daily. 05/17/18   [name], MD  omeprazole (PRILOSEC) 20 MG capsule Take 1 capsule (20 mg total) by mouth daily as needed. 01/01/18   [name], MD  polyethylene glycol [facility]) packet Take 34 g by mouth daily.     [provider]  pravastatin (PRAVACHOL) 20 MG tablet TAKE 1 TABLET BY MOUTH EVERY DAY 05/29/17   [name], MD  Probiotic Product (ALIGN) 4 MG CAPS Take 1 capsule (4 mg total) by mouth daily. 12/15/16   Nicholes Mango, MD  Selenium 200 MCG CAPS Take 1 capsule by mouth daily.    [provider]  traMADol (ULTRAM) 50 MG tablet Take 1 tablet (50 mg total) 4 (four) times daily as needed by mouth. 10/10/17   [name], MD  Turmeric 500 MG CAPS Take 500 mg by mouth daily.    [provider]  vitamin B-12 (CYANOCOBALAMIN) 500 MCG tablet Take 500 mcg by mouth daily.    [provider]    REVIEW OF SYSTEMS:  Review of Systems  Constitutional: Negative for chills, fever, malaise/fatigue and weight loss.  HENT: Negative for ear pain, hearing loss and tinnitus.   Eyes: Negative for blurred  vision, double vision, pain and redness.  Respiratory: Negative for cough, hemoptysis and shortness of breath.   Cardiovascular: Negative for chest pain, palpitations, orthopnea and leg swelling.  Gastrointestinal: Negative for abdominal pain, constipation, diarrhea, nausea and vomiting.  Genitourinary: Negative for dysuria, frequency and hematuria.  Musculoskeletal: Negative for back pain, joint pain and neck pain.  Skin:       No acne,  rash, or lesions  Neurological: Positive for speech change. Negative for dizziness, tremors, focal weakness and weakness.  Endo/Heme/Allergies: Negative for polydipsia. Does not bruise/bleed easily.  Psychiatric/Behavioral: Negative for depression. The patient is not nervous/anxious and does not have insomnia.      VITAL SIGNS:   Vitals:   05/25/18 2000 05/25/18 2019 05/25/18 2045 05/25/18 2100  BP: (!) 162/66     Pulse:   72 68  Resp: (!) 24  (!) 22 16  Temp:  97.7 F (36.5 C)    SpO2:   97% 98%  Weight:      Height:       Wt Readings from Last 3 Encounters:  05/25/18 96.6 kg (213 lb)  05/17/18 96.6 kg (213 lb)  05/14/18 95.3 kg (210 lb)    PHYSICAL EXAMINATION:  Physical Exam  Vitals reviewed. Constitutional: He is oriented to person, place, and time. He appears well-developed and well-nourished. No distress.  HENT:  Head: Normocephalic and atraumatic.  Mouth/Throat: Oropharynx is clear and moist.  Eyes: Pupils are equal, round, and reactive to light. Conjunctivae and EOM are normal. No scleral icterus.  Neck: Normal range of motion. Neck supple. No JVD present. No thyromegaly present.  Cardiovascular: Normal rate, regular rhythm and intact distal pulses. Exam reveals no gallop and no friction rub.  No murmur heard. Respiratory: Effort normal and breath sounds normal. No respiratory distress. He has no wheezes. He has no rales.  GI: Soft. Bowel sounds are normal. He exhibits no distension. There is no tenderness.  Musculoskeletal:  Normal range of motion. He exhibits no edema.  No arthritis, no gout  Lymphadenopathy:    He has no cervical adenopathy.  Neurological: He is alert and oriented to person, place, and time. No cranial nerve deficit.  Neurologic: Cranial nerves II-XII intact, Sensation intact to light touch/pinprick, 5/5 strength in all extremities, no dysarthria, no aphasia, no dysphagia, memory intact, finger to nose testing showed no abnormality, no pronator drift  Skin: Skin is warm and dry. No rash noted. No erythema.  Psychiatric: He has a normal mood and affect. His behavior is normal. Judgment and thought content normal.    LABORATORY PANEL:   CBC Recent Labs  Lab 05/25/18 1953  WBC 4.8  HGB 12.6*  HCT 35.6*  PLT 210   ------------------------------------------------------------------------------------------------------------------  Chemistries  Recent Labs  Lab 05/25/18 1953  NA 132*  K 3.9  CL 96*  CO2 29  GLUCOSE 124*  BUN 13  CREATININE 0.67  CALCIUM 8.8*  AST 25  ALT 14  ALKPHOS 52  BILITOT 0.8   ------------------------------------------------------------------------------------------------------------------  Cardiac Enzymes Recent Labs  Lab 05/25/18 1953  TROPONINI <0.03   ------------------------------------------------------------------------------------------------------------------  RADIOLOGY:  Ct Head Code Stroke Wo Contrast  Result Date: 05/25/2018 CLINICAL DATA:  Code stroke. Subacute neuro deficit. Transient slurred speech, now at baseline. EXAM: CT HEAD WITHOUT CONTRAST TECHNIQUE: Contiguous axial images were obtained from the base of the skull through the vertex without intravenous contrast. COMPARISON:  11/20/2010 FINDINGS: Brain: No evidence of acute infarction, hemorrhage, hydrocephalus, extra-axial collection or mass lesion/mass effect. Small remote high anterior right frontal cortex infarct. Mild cerebral volume loss since prior. Mild chronic small  vessel ischemia in the cerebral white matter for age. Vascular: Atherosclerotic calcifications. Sulcal calcification along the right sylvian fissure new from 2011, possible calcified embolism. Skull: No acute or aggressive finding. Sinuses/Orbits: Negative Other: These results were called by telephone at the time of interpretation on 05/25/2018 at 7:47 pm to Dr. Saralyn Pilar  ROBINSON , who verbally acknowledged these results. ASPECTS [facility] Stroke Program Early CT Score) - Ganglionic level infarction (caudate, lentiform nuclei, internal capsule, insula, M1-M3 cortex): 7 - Supraganglionic infarction (M4-M6 cortex): 3 Total score (0-10 with 10 being normal): 10 IMPRESSION: 1. Negative for hemorrhage or acute infarct.ASPECTS is 10. 2. Right sulcal calcification not seen in 2011. This could be a calcified embolism, age-indeterminate. 3. Small remote right frontal cortex infarct. Electronically Signed   By: Monte Fantasia M.D.   On: 05/25/2018 19:50    EKG:   Orders placed or performed during the hospital encounter of 05/25/18  . ED EKG  . ED EKG  . EKG 12-Lead  . EKG 12-Lead    IMPRESSION AND PLAN:  Principal Problem:   Expressive aphasia -now resolved, admit per stroke admission order set with appropriate imaging, labs, consults Active Problems:   GAD (generalized anxiety disorder) -home dose anxiolytic   Essential hypertension, benign -permissive hypertension for the first 24 hours, hold antihypertensives to allow blood pressure up to 220/120   Coronary atherosclerosis of native coronary artery -continue home meds   Chronic rheumatic arthritis ([place]) -home dose DMARDs   GERD -home dose PPI  Chart review performed and case discussed with ED provider. Labs, imaging and/or ECG reviewed by provider and discussed with patient/family. Management plans discussed with the patient and/or family.  DVT PROPHYLAXIS: SubQ lovenox  GI PROPHYLAXIS: PPI  ADMISSION STATUS: Observation  CODE STATUS:  Full Code Status History    Date Active Date Inactive Code Status Order ID Comments User Context   10/18/2017 2238 10/22/2017 2015 Full Code [id number]  Gorden Harms, MD Inpatient   04/03/2017 0632 04/05/2017 1808 Full Code [id number]  Harrie Foreman, MD Inpatient   12/11/2016 2139 12/15/2016 1920 Full Code [id number]  Demetrios Loll, MD Inpatient   09/07/2015 1505 09/08/2015 1744 Full Code [id number]  Dustin Flock, MD Inpatient   09/02/2015 1716 09/05/2015 1652 Full Code [id number]  Hooten, Laurice Record, MD Inpatient      TOTAL TIME TAKING CARE OF THIS PATIENT: 40 minutes.   Tiyonna Sardinha Pickens 05/25/2018, 9:35 PM  [facility]  Office  [phone]  CC: Primary care physician; [name], MD  Note:  This document was prepared using Dragon voice recognition software and may include unintentional dictation errors.

## 2018-05-25 NOTE — ED Notes (Signed)
Spoke with MD Jannifer Franklin about patient's diet. MD reported he would d/c NPO order and put a diet order in for patient. Floor RN, Mattie, updated.

## 2018-05-25 NOTE — ED Notes (Signed)
Report given Jenna RN

## 2018-05-26 ENCOUNTER — Observation Stay: Payer: Medicare PPO

## 2018-05-26 ENCOUNTER — Observation Stay
Admit: 2018-05-26 | Discharge: 2018-05-26 | Disposition: A | Discharge status: Home / Self Care | Payer: Medicare PPO | Attending: Internal Medicine | Admitting: Internal Medicine

## 2018-05-26 DIAGNOSIS — R4701 Aphasia: Secondary | ICD-10-CM

## 2018-05-26 DIAGNOSIS — I10 Essential (primary) hypertension: Secondary | ICD-10-CM | POA: Diagnosis not present

## 2018-05-26 DIAGNOSIS — I2510 Atherosclerotic heart disease of native coronary artery without angina pectoris: Secondary | ICD-10-CM | POA: Diagnosis not present

## 2018-05-26 DIAGNOSIS — G459 Transient cerebral ischemic attack, unspecified: Secondary | ICD-10-CM | POA: Diagnosis not present

## 2018-05-26 DIAGNOSIS — F411 Generalized anxiety disorder: Secondary | ICD-10-CM | POA: Diagnosis not present

## 2018-05-26 DIAGNOSIS — I6523 Occlusion and stenosis of bilateral carotid arteries: Secondary | ICD-10-CM | POA: Diagnosis not present

## 2018-05-26 LAB — LIPID PANEL
CHOL/HDL RATIO: 2.7 ratio
CHOLESTEROL: 114 mg/dL (ref 0–200)
HDL: 43 mg/dL (ref >40)
LDL Cholesterol: 61 mg/dL (ref 0–99)
TRIGLYCERIDES: 49 mg/dL (ref <150)
VLDL: 10 mg/dL (ref 0–40)

## 2018-05-26 LAB — URINE DRUG SCREEN, QUALITATIVE (ARMC ONLY)
AMPHETAMINES, UR SCREEN: NOT DETECTED
BENZODIAZEPINE, UR SCRN: POSITIVE — AB
CANNABINOID 50 NG, UR ~~LOC~~: NOT DETECTED
Cocaine Metabolite,Ur ~~LOC~~: NOT DETECTED
MDMA (Ecstasy)Ur Screen: NOT DETECTED
Methadone Scn, Ur: NOT DETECTED
OPIATE, UR SCREEN: NOT DETECTED
PHENCYCLIDINE (PCP) UR S: NOT DETECTED
TRICYCLIC, UR SCREEN: NOT DETECTED

## 2018-05-26 LAB — HEMOGLOBIN A1C
Hgb A1c MFr Bld: 5.1 % (ref 4.8–5.6)
Mean Plasma Glucose: 99.67 mg/dL

## 2018-05-26 MED ORDER — ASPIRIN 325 MG PO TABS: 325.0000 mg | ORAL_TABLET | Freq: Every day | ORAL | Status: DC

## 2018-05-26 MED ORDER — ASPIRIN 325 MG PO TABS
325.0000 mg | ORAL_TABLET | Freq: Every day | ORAL | Status: DC
  Administered 2018-05-26: 325 mg via ORAL
  Filled 2018-05-26: qty 1

## 2018-05-26 MED ORDER — HYDRALAZINE HCL 20 MG/ML IJ SOLN
10.0000 mg | INTRAMUSCULAR | Status: DC | PRN
  Administered 2018-05-26: 01:00:00 10 mg via INTRAVENOUS
  Filled 2018-05-26: qty 1

## 2018-05-26 MED ORDER — LABETALOL HCL 5 MG/ML IV SOLN: 10.0000 mg | INTRAVENOUS | Status: DC | PRN

## 2018-05-26 NOTE — Care Management Note (Addendum)
Case Management Note  Patient Details  Name: [name] MRN: [id number] Date of Birth: July 03, 1934  Subjective/Objective:  Patient admitted to [facility] under observation status for expressive aphasia. RNCM consulted on patient to provide MOON letter and complete assessment. Spoke with daughter and spouse [name] [phone]. Patient lives with spouse and daughter. Uses a walker and is currently open with [place]. Patient is able to safely complete activities of daily living although patient expresses he feels weaker than normal but has progressed. Family still able to provide transport. PCP is Dr Silvio Pate. Utilizes [facility] pharmacy and obtains medications without issue.  Patient to be discharged per MD order with resumption of home health. Patient and spouse confirmed they would like to continue with [facility]. Notified liaison [place] and she is aware of referral. Patient already has walker in the home and needs no other DME. RNCM. Merrily Pew Kimie Pidcock RN BSN RNCM [phone]                    Action/Plan: PT pending.   Expected Discharge Date:                  Expected Discharge Plan:     In-House Referral:     Discharge planning Services     Post Acute Care Choice:    Choice offered to:     DME Arranged:    DME Agency:     HH Arranged:    HH Agency:     Status of Service:     If discussed at [employer], dates discussed:    Additional Comments:  Latanya Maudlin, RN 05/26/2018, 9:40 AM

## 2018-05-26 NOTE — Progress Notes (Signed)
Advanced care plan.  Purpose of the Encounter: CODE STATUS  Parties in [place] him self  Patient's Decision Capacity:intact  Subjective/Patient's story: Patient is a 84 year old white male with history of coronary artery disease, prostate cancer, diverticulitis, GERD, essential hypertension, and neurogenic bladder omitted for TIA   Objective/Medical story Discussed with the patient regarding his wishes for cardiac resuscitation and intubation.   Goals of care determination:  Patient wishes to be resuscitated.  He would like to be a full code   CODE STATUS: Full code   Time spent discussing advanced care planning: 16 minutes

## 2018-05-26 NOTE — Discharge Summary (Signed)
[place], 84 y.o., DOB 05/18/1934, MRN [id number]. Admission date: 05/25/2018 Discharge Date 05/26/2018 Primary MD [name], MD Admitting Physician Lance Coon, MD  Admission Diagnosis  TIA (transient ischemic attack) [G45.9] Expressive aphasia [R47.01]  Discharge Diagnosis   Principal Problem:   Expressive aphasia due to TIA     Essential hypertension, benign   Coronary atherosclerosis of native coronary artery   GERD   Chronic rheumatic arthritis ([place]) Generalized anxiety disorder  Chronic rheumatoid arthritis       Hospital Course [name]  is a 84 y.o. male who presents with an episode of expressive aphasia that lasted a little more than an hour.  Patient's symptoms subsequently resolved.  He underwent MRI which showed old stroke no new stroke.  Carotid Dopplers with less than 49% stenosis.  Patient was seen by neurology recommended aspirin 325 from 81 mg.  Patient is currently back to his baseline.  He is stable for discharge.             Consults  neurology  Significant Tests:  See full reports for all details    [name] LN Contrast  Result Date: 05/25/2018 CLINICAL DATA:  Transient ischemic attack.  Slurred speech. EXAM: MRI HEAD WITHOUT CONTRAST MRA HEAD WITHOUT CONTRAST TECHNIQUE: Multiplanar, multiecho pulse sequences of the brain and surrounding structures were obtained without intravenous contrast. Angiographic images of the head were obtained using MRA technique without contrast. COMPARISON:  None. FINDINGS: MRI HEAD FINDINGS BRAIN: The midline structures are normal. There is no acute infarct, acute hemorrhage or mass. Multifocal white matter hyperintensity, most commonly due to chronic ischemic microangiopathy. There is an old right frontal subcortical infarct. There is advanced generalized atrophy without a clear lobar predilection. Blood-sensitive sequences show no chronic microhemorrhage  or superficial siderosis. SKULL AND UPPER CERVICAL SPINE: The visualized skull base, calvarium, upper cervical spine and extracranial soft tissues are normal. SINUSES/ORBITS: No fluid levels or advanced mucosal thickening. No mastoid or middle ear effusion. The orbits are normal. MRA HEAD FINDINGS Intracranial internal carotid arteries: Normal. Anterior cerebral arteries: Normal. Middle cerebral arteries: Normal. Posterior communicating arteries: Present bilaterally. Posterior cerebral arteries: Loss of normal flow related enhancement of the right PCA at the mid P2 segment. Otherwise normal. Basilar artery: Normal. Vertebral arteries: Left dominant. Normal. Superior cerebellar arteries: Normal. Anterior inferior cerebellar arteries: Not clearly visualized Posterior inferior cerebellar arteries: Normal. IMPRESSION: 1. No acute intracranial abnormality. 2. No proximal large vessel occlusion. Diminished flow related enhancement of the midportion of the right posterior cerebral artery P2 segment, either severely narrowed or occluded. 3. Old right frontal subcortical infarct and findings of chronic small vessel disease. 4. Generalized atrophy without a clear lobar predilection. Electronically Signed   By: Ulyses Jarred M.D.   On: 05/25/2018 22:30   Mr Brain Wo Contrast  Result Date: 05/25/2018 CLINICAL DATA:  Transient ischemic attack.  Slurred speech. EXAM: MRI HEAD WITHOUT CONTRAST MRA HEAD WITHOUT CONTRAST TECHNIQUE: Multiplanar, multiecho pulse sequences of the brain and surrounding structures were obtained without intravenous contrast. Angiographic images of the head were obtained using MRA technique without contrast. COMPARISON:  None. FINDINGS: MRI HEAD FINDINGS BRAIN: The midline structures are normal. There is no acute infarct, acute hemorrhage or mass. Multifocal white matter hyperintensity, most commonly due to chronic ischemic microangiopathy. There is an old right frontal subcortical infarct. There is  advanced generalized atrophy without a clear lobar predilection. Blood-sensitive sequences show no chronic  microhemorrhage or superficial siderosis. SKULL AND UPPER CERVICAL SPINE: The visualized skull base, calvarium, upper cervical spine and extracranial soft tissues are normal. SINUSES/ORBITS: No fluid levels or advanced mucosal thickening. No mastoid or middle ear effusion. The orbits are normal. MRA HEAD FINDINGS Intracranial internal carotid arteries: Normal. Anterior cerebral arteries: Normal. Middle cerebral arteries: Normal. Posterior communicating arteries: Present bilaterally. Posterior cerebral arteries: Loss of normal flow related enhancement of the right PCA at the mid P2 segment. Otherwise normal. Basilar artery: Normal. Vertebral arteries: Left dominant. Normal. Superior cerebellar arteries: Normal. Anterior inferior cerebellar arteries: Not clearly visualized Posterior inferior cerebellar arteries: Normal. IMPRESSION: 1. No acute intracranial abnormality. 2. No proximal large vessel occlusion. Diminished flow related enhancement of the midportion of the right posterior cerebral artery P2 segment, either severely narrowed or occluded. 3. Old right frontal subcortical infarct and findings of chronic small vessel disease. 4. Generalized atrophy without a clear lobar predilection. Electronically Signed   By: Ulyses Jarred M.D.   On: 05/25/2018 22:30   US Carotid Bilateral (at [facility] Only)  Result Date: 05/26/2018 CLINICAL DATA:  84 year old male with expressive aphasia EXAM: BILATERAL CAROTID DUPLEX ULTRASOUND TECHNIQUE: Pearline Cables scale imaging, color Doppler and duplex ultrasound were performed of bilateral carotid and vertebral arteries in the neck. COMPARISON:  Brain MRI 05/25/2018 FINDINGS: Criteria: Quantification of carotid stenosis is based on velocity parameters that correlate the residual internal carotid diameter with NASCET-based stenosis levels, using the diameter of the distal internal  carotid lumen as the denominator for stenosis measurement. The following velocity measurements were obtained: RIGHT ICA: 105/20 cm/sec CCA: 696/78 cm/sec SYSTOLIC ICA/CCA RATIO:  0.9 ECA:  147 cm/sec LEFT ICA: 105/21 cm/sec CCA: 938/10 cm/sec SYSTOLIC ICA/CCA RATIO:  1.1 ECA:  175 cm/sec RIGHT CAROTID ARTERY: Heterogeneous atherosclerotic plaque in the distal common carotid artery extending into the proximal internal carotid artery. By peak systolic velocity criteria, the estimated stenosis remains less than 50%. RIGHT VERTEBRAL ARTERY:  Patent with normal antegrade flow. LEFT CAROTID ARTERY: Mild heterogeneous atherosclerotic plaque in the distal common carotid artery extending into the proximal internal carotid artery. By peak systolic velocity criteria, the estimated stenosis remains less than 50%. LEFT VERTEBRAL ARTERY:  Patent with antegrade flow. IMPRESSION: 1. Mild (1-49%) stenosis proximal right internal carotid artery secondary to trace heterogeneous atherosclerotic plaque. 2. Mild (1-49%) stenosis proximal left internal carotid artery secondary to trace heterogeneous atherosclerotic plaque. 3. Vertebral arteries are patent with normal antegrade flow. Signed, Criselda Peaches, MD [facility] Electronically Signed   By: Jacqulynn Cadet M.D.   On: 05/26/2018 09:06   Dg Chest Portable 1 View  Result Date: 05/05/2018 CLINICAL DATA:  Abdominal region pain. Hypertension. History of prostate carcinoma EXAM: PORTABLE CHEST 1 VIEW COMPARISON:  October 20, 2017 FINDINGS: There is patchy atelectatic change in lung bases. There is no demonstrable edema or consolidation. Heart is borderline enlarged with pulmonary vascularity normal. No adenopathy. There is aortic atherosclerosis. There is an azygos lobe on the right, an anatomic variant. There is advanced arthropathy in the shoulders bilaterally. Bones are osteoporotic. IMPRESSION: Patchy bibasilar  atelectasis. No frank edema or consolidation. Stable cardiac prominence. There is aortic atherosclerosis. There is advanced arthropathy in the shoulders with bones showing evidence of osteoporosis. Aortic Atherosclerosis (ICD10-I70.0). Electronically Signed   By: Lowella Grip III M.D.   On: 05/05/2018 07:13   Ct Head Code Stroke Wo Contrast  Result Date: 05/25/2018 CLINICAL DATA:  Code stroke. Subacute neuro deficit. Transient  slurred speech, now at baseline. EXAM: CT HEAD WITHOUT CONTRAST TECHNIQUE: Contiguous axial images were obtained from the base of the skull through the vertex without intravenous contrast. COMPARISON:  11/20/2010 FINDINGS: Brain: No evidence of acute infarction, hemorrhage, hydrocephalus, extra-axial collection or mass lesion/mass effect. Small remote high anterior right frontal cortex infarct. Mild cerebral volume loss since prior. Mild chronic small vessel ischemia in the cerebral white matter for age. Vascular: Atherosclerotic calcifications. Sulcal calcification along the right sylvian fissure new from 2011, possible calcified embolism. Skull: No acute or aggressive finding. Sinuses/Orbits: Negative Other: These results were called by telephone at the time of interpretation on 05/25/2018 at 7:47 pm to Dr. Merlyn Lot , who verbally acknowledged these results. ASPECTS [facility] Stroke Program Early CT Score) - Ganglionic level infarction (caudate, lentiform nuclei, internal capsule, insula, M1-M3 cortex): 7 - Supraganglionic infarction (M4-M6 cortex): 3 Total score (0-10 with 10 being normal): 10 IMPRESSION: 1. Negative for hemorrhage or acute infarct.ASPECTS is 10. 2. Right sulcal calcification not seen in 2011. This could be a calcified embolism, age-indeterminate. 3. Small remote right frontal cortex infarct. Electronically Signed   By: Monte Fantasia M.D.   On: 05/25/2018 19:50       Today   Subjective:   Anselm Pancoast patient denies any symptoms Objective:   Blood  pressure (!) 128/50, pulse 75, temperature 98.2 F (36.8 C), temperature source Oral, resp. rate 18, height 5\' 10"  (1.778 m), weight 93 kg (205 lb 1.6 oz), SpO2 96 %.  .  Intake/Output Summary (Last 24 hours) at 05/26/2018 1445 Last data filed at 05/26/2018 0720 Gross per 24 hour  Intake -  Output 500 ml  Net -500 ml    Exam VITAL SIGNS: Blood pressure (!) 128/50, pulse 75, temperature 98.2 F (36.8 C), temperature source Oral, resp. rate 18, height 5\' 10"  (1.778 m), weight 93 kg (205 lb 1.6 oz), SpO2 96 %.  GENERAL:  84 y.o.-year-old patient lying in the bed with no acute distress.  EYES: Pupils equal, round, reactive to light and accommodation. No scleral icterus. Extraocular muscles intact.  HEENT: Head atraumatic, normocephalic. Oropharynx and nasopharynx clear.  NECK:  Supple, no jugular venous distention. No thyroid enlargement, no tenderness.  LUNGS: Normal breath sounds bilaterally, no wheezing, rales,rhonchi or crepitation. No use of accessory muscles of respiration.  CARDIOVASCULAR: S1, S2 normal. No murmurs, rubs, or gallops.  ABDOMEN: Soft, nontender, nondistended. Bowel sounds present. No organomegaly or mass.  EXTREMITIES: No pedal edema, cyanosis, or clubbing.  NEUROLOGIC: Cranial nerves II through XII are intact. Muscle strength 5/5 in all extremities. Sensation intact. Gait not checked.  PSYCHIATRIC: The patient is alert and oriented x 3.  SKIN: No obvious rash, lesion, or ulcer.   Data Review     CBC w Diff:  Lab Results  Component Value Date   WBC 4.8 05/25/2018   HGB 12.6 (L) 05/25/2018   HGB 11.1 (L) 09/28/2014   HCT 35.6 (L) 05/25/2018   HCT 33.6 (L) 09/28/2014   PLT 210 05/25/2018   PLT 192 09/28/2014   LYMPHOPCT 18 05/25/2018   LYMPHOPCT 18.9 09/09/2014   MONOPCT 8 05/25/2018   MONOPCT 6.4 09/09/2014   EOSPCT 6 05/25/2018   EOSPCT 7.8 09/09/2014   BASOPCT 1 05/25/2018   BASOPCT 1.1 09/09/2014   CMP:  Lab Results  Component Value Date   NA 132  (L) 05/25/2018   NA 138 09/28/2014   K 3.9 05/25/2018   K 4.6 09/28/2014   CL 96 (L) 05/25/2018  CL 101 09/28/2014   CO2 29 05/25/2018   CO2 30 09/28/2014   BUN 13 05/25/2018   BUN 16 09/28/2014   CREATININE 0.67 05/25/2018   CREATININE 0.62 09/28/2014   PROT 6.8 05/25/2018   PROT 5.9 (L) 09/28/2014   ALBUMIN 3.7 05/25/2018   ALBUMIN 3.2 (L) 09/28/2014   BILITOT 0.8 05/25/2018   BILITOT 0.5 09/28/2014   ALKPHOS 52 05/25/2018   ALKPHOS 49 09/28/2014   AST 25 05/25/2018   AST 36 09/28/2014   ALT 14 05/25/2018   ALT 26 09/28/2014  .  Micro Results No results found for this or any previous visit (from the past 240 hour(s)).      Code Status Orders  (From admission, onward)        Start     Ordered   05/25/18 2237  Full code  Continuous     05/25/18 2236    Code Status History    Date Active Date Inactive Code Status Order ID Comments User Context   10/18/2017 2238 10/22/2017 2015 Full Code [id number]  Gorden Harms, MD Inpatient   04/03/2017 0632 04/05/2017 1808 Full Code [id number]  Harrie Foreman, MD Inpatient   12/11/2016 2139 12/15/2016 1920 Full Code [id number]  Demetrios Loll, MD Inpatient   09/07/2015 1505 09/08/2015 1744 Full Code [id number]  Dustin Flock, MD Inpatient   09/02/2015 1716 09/05/2015 1652 Full Code [id number]  [name], MD Inpatient            Discharge Medications   Allergies as of 05/26/2018      Reactions   Ciprofloxacin Nausea And Vomiting   Headache   Citalopram Hydrobromide Other (See Comments)   "unknown"   Lorazepam    Adverse reaction   Paroxetine Nausea Only   Ramipril Other (See Comments)   "unknown"   Simvastatin    Sulfa Antibiotics Other (See Comments)      Medication List    TAKE these medications   albuterol 108 (90 Base) MCG/ACT inhaler Commonly known as:  PROVENTIL HFA;VENTOLIN HFA Inhale 2 puffs into the lungs every 6 (six) hours as needed for wheezing or shortness of breath.   ALIGN 4 MG Caps Take  1 capsule (4 mg total) by mouth daily.   ALPRAZolam 0.5 MG tablet Commonly known as:  XANAX TAKE 1 TABLET BY MOUTH THREE TIMES DAILY AS NEEDED FOR ANXIETY OR SLEEP   aspirin 325 MG tablet Take 1 tablet (325 mg total) by mouth daily. Start taking on:  05/27/2018 What changed:    medication strength  how much to take   Co Q 10 100 MG Caps Take 1 capsule by mouth daily.   Cranberry 425 MG Caps Take 2 capsules by mouth daily.   fluticasone 50 MCG/ACT nasal spray Commonly known as:  FLONASE Place 2 sprays into both nostrils 2 (two) times daily.   folic acid 1 MG tablet Commonly known as:  FOLVITE Take 1 tablet (1 mg total) by mouth daily.   furosemide 20 MG tablet Commonly known as:  LASIX TAKE 1 TABLET BY MOUTH EVERY DAY What changed:    how much to take  how to take this  when to take this   gabapentin 300 MG capsule Commonly known as:  NEURONTIN TAKE 1 CAPSULE BY MOUTH THREE TIMES DAILY   hydroxychloroquine 200 MG tablet Commonly known as:  PLAQUENIL Take 1 tablet by mouth 2 (two) times daily.   methotrexate 2.5 MG tablet Commonly known as:  RHEUMATREX Take  10 tablets by mouth once a week.   Milk Thistle 250 MG Caps Take 1 capsule by mouth daily.   multivitamin tablet Take 1 tablet by mouth daily.   nystatin powder Commonly known as:  MYCOSTATIN/NYSTOP Apply topically 4 (four) times daily.   omeprazole 20 MG capsule Commonly known as:  PRILOSEC Take 1 capsule (20 mg total) by mouth daily as needed.   polyethylene glycol packet Commonly known as:  MIRALAX / GLYCOLAX Take 34 g by mouth daily.   pravastatin 20 MG tablet Commonly known as:  PRAVACHOL TAKE 1 TABLET BY MOUTH EVERY DAY   Selenium 200 MCG Caps Take 1 capsule by mouth daily.   traMADol 50 MG tablet Commonly known as:  ULTRAM Take 1 tablet (50 mg total) 4 (four) times daily as needed by mouth.   Turmeric 500 MG Caps Take 500 mg by mouth daily.   vitamin B-12 500 MCG  tablet Commonly known as:  CYANOCOBALAMIN Take 500 mcg by mouth daily.   Vitamin D3 5000 units Caps Take 1 capsule by mouth daily.          Total Time in preparing paper work, data evaluation and todays exam - 71 minutes  Dustin Flock M.D on 05/26/2018 at 2:45 PM [place]  [phone]

## 2018-05-26 NOTE — Care Management Obs Status (Signed)
[place] NOTIFICATION   Patient Details  Name: [name] MRN: [id number] Date of Birth: 1934-07-05   Medicare Observation Status Notification Given:  Yes    Kayler Rise A Makaio Mach, RN 05/26/2018, 1:41 PM

## 2018-05-26 NOTE — Discharge Planning (Addendum)
Patient's IV and tele to be removed.  Chronic Foley to be left intact for DC. RN assessment and VS revealed stability for DC to home with [facility] PT. Echo completed prior to DC per orders. DC papers given, explained and educated. Informed of suggested FU appts and appts made. NO scripts needed at this time, but patient informed of adjusted increase in aspirin dose.  Once ready, will be wheeled to front and family transporting home via car.

## 2018-05-26 NOTE — Evaluation (Signed)
Physical Therapy Evaluation Patient Details Name: [name] MRN: [id number] DOB: February 17, 1934 Today's Date: 05/26/2018   History of Present Illness  Patient is a pleasant 84 y/o male that presents with acute expressive aphasia which has since resolved. He was worked up for potential CVA, though imaging did not reveal acute neurologic process.   Clinical Impression  Patient is a pleasant 84 y/o male seen after acute expressive aphasia event. He reports at baseline he has difficulty completing bed mobility and transfers, however once upright he is able to ambulate without difficulty. He has been seeing a HHPT at home and has been assisted physically by his daughter per patient report. He continues to require significant assistance for bed mobility and transfers, at roughly his baseline level per patient. Once upright he was able to ambulate around RN station without loss of balance, navigating the hallways with RW with ability to stop and start. He would benefit from continued HHPT and 24 hour mobility assistance given his difficulty with transfers and bed mobility.     Follow Up Recommendations Home health PT - 24 hour mobility assistance.     Equipment Recommendations  Rolling walker with 5" wheels    Recommendations for Other Services       Precautions / Restrictions Precautions Precautions: Fall Restrictions Weight Bearing Restrictions: No      Mobility  Bed Mobility Overal bed mobility: Needs Assistance Bed Mobility: Supine to Sit;Sit to Supine     Supine to sit: Mod assist;Max assist Sit to supine: Mod assist;Max assist   General bed mobility comments: Patient requires assistance to manage torso for transfers secondary to trunkal musculature weakness.   Transfers Overall transfer level: Needs assistance Equipment used: Rolling walker (2 wheeled) Transfers: Sit to/from Stand Sit to Stand: Mod assist;Max assist         General transfer comment: Patient requires  prolonged time to complete and physical assistance to perform transfer sit to stand with use of RW and cuing for "nose over toes".   Ambulation/Gait Ambulation/Gait assistance: Min guard Gait Distance (Feet): 200 Feet Assistive device: Rolling walker (2 wheeled) Gait Pattern/deviations: WFL(Within Functional Limits);Trunk flexed   Gait velocity interpretation: <1.31 ft/sec, indicative of household ambulator General Gait Details: Patient noted to have slow reciprocal gait pattern with trunk flexed and mild flexion bilaterally at his knees. He was able to navigate through hallways, start and stop, and change direction without loss of balance.   Stairs            Wheelchair Mobility    Modified Rankin (Stroke Patients Only)       Balance Overall balance assessment: Needs assistance;History of Falls Sitting-balance support: Bilateral upper extremity supported;Feet supported Sitting balance-[name]: Good Sitting balance - Comments: Patient was able to sit at EOB without loss of balance with bilateral UE and LE support    Standing balance support: Bilateral upper extremity supported Standing balance-[name]: Fair Standing balance comment: Patient with forward trunk lean with RW, though no LOB with ambulation                              Pertinent Vitals/Pain Pain Assessment: No/denies pain    Home Living Family/patient expects to be discharged to:: Private residence Living Arrangements: Spouse/significant other;Children Available Help at Discharge: Family Type of Home: House Home Access: Stairs to enter Entrance Stairs-Rails: Psychiatric nurse of Steps: 4 Home Layout: Two level;Able to live on main level with  bedroom/bathroom Home Equipment: Walker - 2 wheels;Cane - single point Additional Comments: pt states his daughter very able to physically assist him but wife cannot    Prior Function Level of Independence: Independent with  assistive device(s)         Comments: Patient reports he has trouble with getting out of bed, but once out of bed he is able to move around well. Has had a recent fall.      Hand Dominance        Extremity/Trunk Assessment   Upper Extremity Assessment Upper Extremity Assessment: Overall WFL for tasks assessed    Lower Extremity Assessment Lower Extremity Assessment: Overall WFL for tasks assessed       Communication   Communication: HOH  Cognition Arousal/Alertness: Awake/alert Behavior During Therapy: WFL for tasks assessed/performed Overall Cognitive Status: Within Functional Limits for tasks assessed                                 General Comments: Patient did seem to have mild-moderate difficulty with word finding at times, but this was intermittent.       General Comments      Exercises     Assessment/Plan    PT Assessment Patient needs continued PT services  PT Problem List Decreased strength;Decreased mobility;Decreased safety awareness;Decreased coordination;Decreased activity tolerance;Cardiopulmonary status limiting activity;Decreased balance       PT Treatment Interventions Therapeutic activities;DME instruction;Gait training;Therapeutic exercise;Stair training;Balance training;Neuromuscular re-education    PT Goals (Current goals can be found in the Care Plan section)  Acute Rehab PT Goals Patient Stated Goal: To return home safely  PT Goal Formulation: With patient Time For Goal Achievement: 06/09/18 Potential to Achieve Goals: Fair    Frequency Min 2X/week   Barriers to discharge Decreased caregiver support Patient will require assistance for OOB transfers.     Co-evaluation               AM-PAC PT "6 Clicks" Daily Activity  Outcome Measure Difficulty turning over in bed (including adjusting bedclothes, sheets and blankets)?: A Lot Difficulty moving from lying on back to sitting on the side of the bed? : A  Lot Difficulty sitting down on and standing up from a chair with arms (e.g., wheelchair, bedside commode, etc,.)?: A Lot Help needed moving to and from a bed to chair (including a wheelchair)?: A Little Help needed walking in hospital room?: None Help needed climbing 3-5 steps with a railing? : A Little 6 Click Score: 16    End of Session Equipment Utilized During Treatment: Gait belt Activity Tolerance: Patient tolerated treatment well Patient left: in bed;with bed alarm set;with call bell/phone within reach Nurse Communication: Mobility status PT Visit Diagnosis: Unsteadiness on feet (R26.81);History of falling (Z91.81);Difficulty in walking, not elsewhere classified (R26.2)    Time: 8032-1224 PT Time Calculation (min) (ACUTE ONLY): 26 min   Charges:   PT Evaluation $PT Eval Moderate Complexity: 1 Mod     PT G Codes:       Royce Macadamia PT, DPT, CSCS    05/26/2018, 12:52 PM

## 2018-05-26 NOTE — Consult Note (Signed)
84 y/o male presented with expressive aphasia that has resolved at this time.  Symptoms started yesterday and lasted under 1 hr. Currently back to baseline.   MRI brain and MRA no acute abnormalities with small vessel disease and chronic ischemia.   - Awaiting Echo - was on ASA 81 mg at home and pravastatin 20 which he was taking  - Will increase ASA to 325 daily.  - d/c planning today

## 2018-05-27 LAB — ECHOCARDIOGRAM COMPLETE
Height: 70 in
Weight: 3281.6 oz

## 2018-05-28 ENCOUNTER — Telehealth: Payer: Self-pay | Admitting: Internal Medicine

## 2018-05-28 ENCOUNTER — Telehealth: Payer: Self-pay

## 2018-05-28 DIAGNOSIS — J45909 Unspecified asthma, uncomplicated: Secondary | ICD-10-CM | POA: Diagnosis not present

## 2018-05-28 DIAGNOSIS — M545 Low back pain: Secondary | ICD-10-CM | POA: Diagnosis not present

## 2018-05-28 DIAGNOSIS — M25511 Pain in right shoulder: Secondary | ICD-10-CM | POA: Diagnosis not present

## 2018-05-28 DIAGNOSIS — F329 Major depressive disorder, single episode, unspecified: Secondary | ICD-10-CM | POA: Diagnosis not present

## 2018-05-28 DIAGNOSIS — G8929 Other chronic pain: Secondary | ICD-10-CM | POA: Diagnosis not present

## 2018-05-28 DIAGNOSIS — M25512 Pain in left shoulder: Secondary | ICD-10-CM | POA: Diagnosis not present

## 2018-05-28 DIAGNOSIS — N319 Neuromuscular dysfunction of bladder, unspecified: Secondary | ICD-10-CM | POA: Diagnosis not present

## 2018-05-28 DIAGNOSIS — I10 Essential (primary) hypertension: Secondary | ICD-10-CM | POA: Diagnosis not present

## 2018-05-28 DIAGNOSIS — I2510 Atherosclerotic heart disease of native coronary artery without angina pectoris: Secondary | ICD-10-CM | POA: Diagnosis not present

## 2018-05-28 NOTE — Telephone Encounter (Signed)
Verbal orders left for [name] on VM

## 2018-05-28 NOTE — Telephone Encounter (Signed)
Copied from [place] [id number]. Topic: Quick Communication - See Telephone Encounter >> May 28, 2018  4:40 PM Gardiner Ramus wrote: CRM for notification. See Telephone encounter for: 05/28/18. [facility] from [facility] called for verbal orders for continued pt 2 x 5. Please Advise. May leave detailed message on voice mail. WB#[phone]

## 2018-05-28 NOTE — Telephone Encounter (Signed)
Transition Care Management Follow-up Telephone Call   Date discharged? 05/26/18   *spoke with daughter, okay per DPR   How have you been since you were released from the hospital? He is doing pretty good, just weak   Do you understand why you were in the hospital? Yes   Do you understand the discharge instructions? Yes   Where were you discharged to? Home    Items Reviewed:  Medications reviewed: Yes  Allergies reviewed: Yes  Dietary changes reviewed: No dietary changes  Referrals reviewed:  Yes, referral to Neurology   Functional Questionnaire:   Activities of Daily Living (ADLs):   He states they are independent in the following: toilets himself, minor grooming and feeds self.   States they require assistance with the following:  Dressing, bathing, transferring, limited stand-by assist with ambulation, uses a walker.   Any transportation issues/concerns?: No   Any patient concerns? No not really, just need to discuss with Dr. Silvio Pate the test results from the hospital.    Confirmed importance and date/time of follow-up visits scheduled Yes  Provider Appointment booked with Dr. Silvio Pate for 06/06/18 at 1215pm  Confirmed with patient if condition begins to worsen call PCP or go to the ER.  Patient was given the office number and encouraged to call back with question or concerns.  : Yes

## 2018-05-28 NOTE — Telephone Encounter (Signed)
That is fine 

## 2018-05-30 ENCOUNTER — Emergency Department: Payer: Medicare PPO

## 2018-05-30 ENCOUNTER — Other Ambulatory Visit: Payer: Self-pay

## 2018-05-30 ENCOUNTER — Emergency Department
Admission: EM | Admit: 2018-05-30 | Discharge: 2018-05-30 | Disposition: A | Discharge status: Home / Self Care | Payer: Medicare PPO | Attending: Emergency Medicine | Admitting: Emergency Medicine

## 2018-05-30 ENCOUNTER — Encounter: Payer: Self-pay | Admitting: Emergency Medicine

## 2018-05-30 DIAGNOSIS — M25512 Pain in left shoulder: Secondary | ICD-10-CM | POA: Diagnosis not present

## 2018-05-30 DIAGNOSIS — M25511 Pain in right shoulder: Secondary | ICD-10-CM | POA: Diagnosis not present

## 2018-05-30 DIAGNOSIS — F329 Major depressive disorder, single episode, unspecified: Secondary | ICD-10-CM | POA: Diagnosis not present

## 2018-05-30 DIAGNOSIS — I2510 Atherosclerotic heart disease of native coronary artery without angina pectoris: Secondary | ICD-10-CM | POA: Insufficient documentation

## 2018-05-30 DIAGNOSIS — Z96653 Presence of artificial knee joint, bilateral: Secondary | ICD-10-CM | POA: Insufficient documentation

## 2018-05-30 DIAGNOSIS — M545 Low back pain: Secondary | ICD-10-CM | POA: Diagnosis not present

## 2018-05-30 DIAGNOSIS — S4992XA Unspecified injury of left shoulder and upper arm, initial encounter: Secondary | ICD-10-CM | POA: Diagnosis not present

## 2018-05-30 DIAGNOSIS — Z7982 Long term (current) use of aspirin: Secondary | ICD-10-CM | POA: Insufficient documentation

## 2018-05-30 DIAGNOSIS — I10 Essential (primary) hypertension: Secondary | ICD-10-CM | POA: Insufficient documentation

## 2018-05-30 DIAGNOSIS — M25519 Pain in unspecified shoulder: Secondary | ICD-10-CM | POA: Diagnosis not present

## 2018-05-30 DIAGNOSIS — R918 Other nonspecific abnormal finding of lung field: Secondary | ICD-10-CM | POA: Diagnosis not present

## 2018-05-30 DIAGNOSIS — R0902 Hypoxemia: Secondary | ICD-10-CM | POA: Diagnosis not present

## 2018-05-30 DIAGNOSIS — N319 Neuromuscular dysfunction of bladder, unspecified: Secondary | ICD-10-CM | POA: Diagnosis not present

## 2018-05-30 DIAGNOSIS — M19012 Primary osteoarthritis, left shoulder: Secondary | ICD-10-CM | POA: Diagnosis not present

## 2018-05-30 DIAGNOSIS — M79622 Pain in left upper arm: Secondary | ICD-10-CM | POA: Diagnosis not present

## 2018-05-30 DIAGNOSIS — Z79899 Other long term (current) drug therapy: Secondary | ICD-10-CM | POA: Insufficient documentation

## 2018-05-30 DIAGNOSIS — J45909 Unspecified asthma, uncomplicated: Secondary | ICD-10-CM | POA: Insufficient documentation

## 2018-05-30 DIAGNOSIS — G8929 Other chronic pain: Secondary | ICD-10-CM | POA: Diagnosis not present

## 2018-05-30 DIAGNOSIS — R000 Tachycardia, unspecified: Secondary | ICD-10-CM

## 2018-05-30 LAB — COMPREHENSIVE METABOLIC PANEL
ALT: 13 U/L (ref 0–44)
ANION GAP: 10 (ref 5–15)
AST: 24 U/L (ref 15–41)
Albumin: 3.8 g/dL (ref 3.5–5.0)
Alkaline Phosphatase: 59 U/L (ref 38–126)
BUN: 13 mg/dL (ref 8–23)
CHLORIDE: 94 mmol/L — AB (ref 98–111)
CO2: 24 mmol/L (ref 22–32)
Calcium: 8.6 mg/dL — ABNORMAL LOW (ref 8.9–10.3)
Creatinine, Ser: 0.56 mg/dL — ABNORMAL LOW (ref 0.61–1.24)
GFR calc non Af Amer: >60 mL/min (ref >60)
Glucose, Bld: 114 mg/dL — ABNORMAL HIGH (ref 70–99)
Potassium: 4.1 mmol/L (ref 3.5–5.1)
SODIUM: 128 mmol/L — AB (ref 135–145)
Total Bilirubin: 1.1 mg/dL (ref 0.3–1.2)
Total Protein: 6.7 g/dL (ref 6.5–8.1)

## 2018-05-30 LAB — CBC
HCT: 37 % — ABNORMAL LOW (ref 40.0–52.0)
Hemoglobin: 13.2 g/dL (ref 13.0–18.0)
MCH: 32.7 pg (ref 26.0–34.0)
MCHC: 35.7 g/dL (ref 32.0–36.0)
MCV: 91.4 fL (ref 80.0–100.0)
PLATELETS: 183 10*3/uL (ref 150–440)
RBC: 4.04 MIL/uL — AB (ref 4.40–5.90)
RDW: 14.4 % (ref 11.5–14.5)
WBC: 7.2 10*3/uL (ref 3.8–10.6)

## 2018-05-30 LAB — TROPONIN I

## 2018-05-30 MED ORDER — METHYLPREDNISOLONE SODIUM SUCC 125 MG IJ SOLR
125.0000 mg | Freq: Once | INTRAMUSCULAR | Status: AC
  Administered 2018-05-30: 125 mg via INTRAVENOUS

## 2018-05-30 MED ORDER — IOHEXOL 350 MG/ML SOLN
75.0000 mL | Freq: Once | INTRAVENOUS | Status: AC | PRN
  Administered 2018-05-30: 75 mL via INTRAVENOUS

## 2018-05-30 MED ORDER — KETOROLAC TROMETHAMINE 30 MG/ML IJ SOLN
INTRAMUSCULAR | Status: AC
  Filled 2018-05-30: qty 1

## 2018-05-30 MED ORDER — MORPHINE SULFATE (PF) 4 MG/ML IV SOLN
INTRAVENOUS | Status: AC
  Administered 2018-05-30: 4 mg via INTRAVENOUS
  Filled 2018-05-30: qty 1

## 2018-05-30 MED ORDER — ONDANSETRON HCL 4 MG/2ML IJ SOLN
INTRAMUSCULAR | Status: AC
  Administered 2018-05-30: 4 mg via INTRAVENOUS
  Filled 2018-05-30: qty 2

## 2018-05-30 MED ORDER — ACETAMINOPHEN 500 MG PO TABS
1000.0000 mg | ORAL_TABLET | Freq: Once | ORAL | Status: AC
  Administered 2018-05-30: 1000 mg via ORAL
  Filled 2018-05-30: qty 2

## 2018-05-30 MED ORDER — MORPHINE SULFATE (PF) 4 MG/ML IV SOLN
4.0000 mg | Freq: Once | INTRAVENOUS | Status: AC
  Administered 2018-05-30: 4 mg via INTRAVENOUS

## 2018-05-30 MED ORDER — SODIUM CHLORIDE 0.9 % IV BOLUS
1000.0000 mL | Freq: Once | INTRAVENOUS | Status: AC
  Administered 2018-05-30: 1000 mL via INTRAVENOUS

## 2018-05-30 MED ORDER — METHYLPREDNISOLONE SODIUM SUCC 125 MG IJ SOLR
INTRAMUSCULAR | Status: AC
  Administered 2018-05-30: 125 mg via INTRAVENOUS
  Filled 2018-05-30: qty 2

## 2018-05-30 MED ORDER — KETOROLAC TROMETHAMINE 30 MG/ML IJ SOLN
15.0000 mg | Freq: Once | INTRAMUSCULAR | Status: AC
  Administered 2018-05-30: 15 mg via INTRAVENOUS

## 2018-05-30 MED ORDER — TRAMADOL HCL 50 MG PO TABS: 100.0000 mg | ORAL_TABLET | Freq: Three times a day (TID) | ORAL | 0 refills | Status: DC | PRN

## 2018-05-30 MED ORDER — ONDANSETRON HCL 4 MG/2ML IJ SOLN
4.0000 mg | Freq: Once | INTRAMUSCULAR | Status: AC
  Administered 2018-05-30: 4 mg via INTRAVENOUS

## 2018-05-30 NOTE — ED Notes (Signed)
Patient's O2 sats dropped to 79% on RA with good wave form after slow push of Morphine via IV. Applied 2L O2 via [place]. Dr. Owens Shark aware. O2 sats returned to 96% on 2L within a few minutes. Patient otherwise asymptomatic.

## 2018-05-30 NOTE — ED Provider Notes (Signed)
Clinical Course as of May 30 1499  Wed May 30, 2018  1128 Pt awake, alert. So2 87% RA. HR 115. After I liter bolus. Hyponatremia chronic due to SIADH. EMR reviewed, had HR 77 at recent hospital discharge in May 2019.   Will need to continue workup with cxr. Bp stable.   [PS]    Clinical Course User Index [PS] Carrie Mew, MD     Chest x-ray negative.  On reassessment patient was tachycardic at 115 and hypoxic to 87% so I obtained a CT scan angiogram of the chest to evaluate for PE given his prostate cancer history.  This was negative for PE or other acute process.  No pneumonia or pulmonary edema.  On reassessment patient is still 88 to 91% on room air, but completely asymptomatic.  Oxygenation returns to mid 90s when he takes a few deep breaths.  At this point think it must be a combination of the opioid effect in addition to the Xanax he took at home, and some degree of chronic pulmonary impairment.  Not in distress is well-appearing is nontoxic, he is breathing well.  I will discharge him home, advised him to avoid opioids and do not mix with benzodiazepines.  He has follow-up with his doctor already within the next week.   Carrie Mew, MD 05/30/18 [phone]

## 2018-05-30 NOTE — ED Notes (Signed)
Pt placed back on 2L after O2 sats fell to 88% on RA. Pt now sat 93%

## 2018-05-30 NOTE — ED Triage Notes (Addendum)
Patient to ER from home via [facility] for c/o left sided shoulder and elbow pain since last night. Patient states he was in restroom last night and pulled on metal bar. States since then, has had increasing pain. States he put heating pad on right shoulder, pain improved. States pain to left side however, has not improved and "has never been so bad". Patient was recently seen in ER for TIA. Has h/o multiple falls. Denies any known injury for shoulder and elbow pain.  Patient states he took 3 IBU and 2 Xanax tablets prior to coming to ER.

## 2018-05-30 NOTE — ED Notes (Signed)
Oxygen turned off. Pt oxygen sats staying at 95% on RA.

## 2018-05-30 NOTE — ED Notes (Signed)
Pt A&O but does not remember what happened last night. Informed pt of what took place. Pt requesting to speak with family. Will contact pt's daughter and allow pt to speak with family.

## 2018-05-30 NOTE — ED Notes (Signed)
Sandwich tray provided to pt. Family at bedside.

## 2018-05-30 NOTE — ED Notes (Signed)
Patient transported to CT 

## 2018-05-30 NOTE — ED Provider Notes (Signed)
[facility] Emergency Department Provider Note ____________   First MD Initiated Contact with Patient 05/30/18 [phone]     (approximate)  I have reviewed the triage vital signs and the nursing notes.   HISTORY  Chief Complaint Shoulder Pain   HPI [name] is a 84 y.o. male with below list of chronic medical conditions including rheumatoid arthritis and CAD presents to the emergency department with bilateral shoulder pain which began last night.  Patient states current pain score is 10 out of 10.  Patient states that pain is worse with any movement of the left shoulder or elbow.  Patient also admits to right shoulder pain which she states is improved since adding a heating pad to left.  Past Medical History:  Diagnosis Date  . Allergy   . Anemia   . Anxiety   . Arthritis    rheumatoid  . Asthma   . CAD (coronary artery disease)   . Cancer [facility])    Prostate  . Chronic sinusitis   . Depression   . Diverticulitis   . ED (erectile dysfunction)   . GERD (gastroesophageal reflux disease)   . History of SIADH   . Hx of adenomatous colonic polyps   . Hypertension   . Hyponatremia   . IBS (irritable bowel syndrome)   . Neurodermatitis   . Neurogenic bladder   . OSA (obstructive sleep apnea)     Patient Active Problem List   Diagnosis Date Noted  . Expressive aphasia 05/25/2018  . Complicated UTI (urinary tract infection) 10/18/2017  . SIADH (syndrome of inappropriate ADH production) ([place]) 04/17/2017  . Hyponatremia 01/16/2017  . Asthma with acute exacerbation 11/14/2016  . Lobar pneumonia ([place]) 11/14/2016  . Mood disorder ([place]) 01/18/2016  . Presence of indwelling urinary catheter 08/20/2015  . Advanced directives, counseling/discussion 07/28/2014  . Routine general medical examination at a health care facility 04/09/2012  . History of prostate cancer 04/09/2012  . Venous stasis dermatitis 06/13/2011  . Chronic sinusitis   . CONSTIPATION,  CHRONIC 12/07/2010  . Hyperlipemia 12/02/2010  . OSTEOARTHRITIS 12/02/2010  . NEUROGENIC BLADDER 05/24/2010  . IBS 05/05/2010  . PERSONAL HISTORY OF COLONIC POLYPS 05/05/2010  . NEUROPATHY 05/03/2010  . Essential hypertension, benign 03/25/2009  . GAD (generalized anxiety disorder) 01/27/2007  . Coronary atherosclerosis of native coronary artery 01/27/2007  . Allergic asthma 01/27/2007  . GERD 01/27/2007  . Chronic rheumatic arthritis ([place]) 01/27/2007    Past Surgical History:  Procedure Laterality Date  . KNEE ARTHROPLASTY Right 09/02/2015   Procedure: COMPUTER ASSISTED TOTAL KNEE ARTHROPLASTY;  Surgeon: [name], MD;  Location: [facility] ORS;  Service: Orthopedics;  Laterality: Right;  . KNEE SURGERY Left    ARTHROSCOPY LEFT  . NASAL SINUS SURGERY  2009   DEVIATED SEPTUM AND POLYPS  . PROSTATE SURGERY     PROSTATECTOMY  . TOTAL KNEE ARTHROPLASTY Left 9/15   Dr [name]  . URETHRAL STRICTURE DILATATION  02-2010   Dr.Cope    Prior to Admission medications   Medication Sig Start Date End Date Taking? Authorizing Provider  albuterol (PROVENTIL HFA;VENTOLIN HFA) 108 (90 Base) MCG/ACT inhaler Inhale 2 puffs into the lungs every 6 (six) hours as needed for wheezing or shortness of breath. 11/14/16   [name], MD  ALPRAZolam Duanne Moron) 0.5 MG tablet TAKE 1 TABLET BY MOUTH THREE TIMES DAILY AS NEEDED FOR ANXIETY OR SLEEP 04/13/18   Tower, Wynelle Fanny, MD  aspirin 325 MG tablet Take 1 tablet (  325 mg total) by mouth daily. 05/27/18   Dustin Flock, MD  Cholecalciferol (VITAMIN D3) 5000 UNITS CAPS Take 1 capsule by mouth daily.    [provider]  Coenzyme Q10 (CO Q 10) 100 MG CAPS Take 1 capsule by mouth daily.    [provider]  Cranberry 425 MG CAPS Take 2 capsules by mouth daily.    [provider]  fluticasone (FLONASE) 50 MCG/ACT nasal spray Place 2 sprays into both nostrils 2 (two) times daily. 04/27/17   [name], MD  folic acid (FOLVITE) 1 MG  tablet Take 1 tablet (1 mg total) by mouth daily. 04/27/17   [name], MD  furosemide (LASIX) 20 MG tablet TAKE 1 TABLET BY MOUTH EVERY DAY Patient not taking: Reported on 05/28/2018 09/19/17   [name], MD  gabapentin (NEURONTIN) 300 MG capsule TAKE 1 CAPSULE BY MOUTH THREE TIMES DAILY 07/12/16   [name], MD  hydroxychloroquine (PLAQUENIL) 200 MG tablet Take 1 tablet by mouth 2 (two) times daily. 01/22/16   [provider]  methotrexate (RHEUMATREX) 2.5 MG tablet Take 10 tablets by mouth once a week. 01/07/16   [provider]  Milk Thistle 250 MG CAPS Take 1 capsule by mouth daily.    [provider]  Multiple Vitamin (MULTIVITAMIN) tablet Take 1 tablet by mouth daily.      [provider]  nystatin (MYCOSTATIN/NYSTOP) powder Apply topically 4 (four) times daily. 05/17/18   [name], MD  omeprazole (PRILOSEC) 20 MG capsule Take 1 capsule (20 mg total) by mouth daily as needed. 01/01/18   [name], MD  polyethylene glycol [facility]) packet Take 34 g by mouth daily.     [provider]  pravastatin (PRAVACHOL) 20 MG tablet TAKE 1 TABLET BY MOUTH EVERY DAY 05/29/17   [name], MD  Probiotic Product (ALIGN) 4 MG CAPS Take 1 capsule (4 mg total) by mouth daily. 12/15/16   Nicholes Mango, MD  Selenium 200 MCG CAPS Take 1 capsule by mouth daily.    [provider]  traMADol (ULTRAM) 50 MG tablet Take 2 tablets (100 mg total) by mouth 3 (three) times daily as needed. 05/30/18   Gregor Hams, MD  Turmeric 500 MG CAPS Take 500 mg by mouth daily.    [provider]  vitamin B-12 (CYANOCOBALAMIN) 500 MCG tablet Take 500 mcg by mouth daily.    [provider]    Allergies Ciprofloxacin; Citalopram hydrobromide; Lorazepam; Paroxetine; Ramipril; Simvastatin; and Sulfa antibiotics  Family History  Problem Relation Age of Onset  . Heart disease Mother 77       heart failure  . Pneumonia  Father 51       pnemonia  . Colon cancer Neg Hx     Social History Social History   Tobacco Use  . Smoking status: Never Smoker  . Smokeless tobacco: Never Used  Substance Use Topics  . Alcohol use: Yes    Alcohol/week: 4.8 oz    Types: 8 Standard drinks or equivalent per week  . Drug use: No    Review of Systems Constitutional: No fever/chills Eyes: No visual changes. ENT: No sore throat. Cardiovascular: Denies chest pain. Respiratory: Denies shortness of breath. Gastrointestinal: No abdominal pain.  No nausea, no vomiting.  No diarrhea.  No constipation. Genitourinary: Negative for dysuria. Musculoskeletal: Negative for neck pain.  Negative for back pain.  Positive for bilateral shoulder pain Integumentary: Negative for rash. Neurological:  Negative for headaches, focal weakness or numbness.   ____________________________________________   PHYSICAL EXAM:  VITAL SIGNS: ED Triage Vitals  Enc Vitals Group     BP 05/30/18 0409 (!) 195/99     Pulse Rate 05/30/18 0409 (!) 105     Resp 05/30/18 0409 15     Temp 05/30/18 0409 98.1 F (36.7 C)     Temp Source 05/30/18 0409 Oral     SpO2 05/30/18 0409 96 %     Weight 05/30/18 0411 93 kg (205 lb)     Height 05/30/18 0411 1.778 m (5\' 10" )     Head Circumference --      Peak Flow --      Pain Score 05/30/18 0410 9     Pain Loc --      Pain Edu? --      Excl. in [place]? --     Constitutional: Alert and oriented.  Apparent discomfort  eyes: Conjunctivae are normal.  Head: Atraumatic. Mouth/Throat: Mucous membranes are moist.  Oropharynx non-erythematous. Neck: No stridor. No cervical spine tenderness to palpation. Cardiovascular: Normal rate, regular rhythm. Good peripheral circulation. Grossly normal heart sounds. Respiratory: Normal respiratory effort.  No retractions. Lungs CTAB. Gastrointestinal: Soft and nontender. No distention.    Musculoskeletal: Bilateral shoulder pain with active and passive range of motion.   Left worse than right.  Left elbow pain with active and passive range of motion. Neurologic:  Normal speech and language. No gross focal neurologic deficits are appreciated.  Skin:  Skin is warm, dry and intact. No rash noted.  ____________________________________________   LABS (all labs ordered are listed, but only abnormal results are displayed)  Labs Reviewed  CBC - Abnormal; Notable for the following components:      Result Value   RBC 4.04 (*)    HCT 37.0 (*)    All other components within normal limits  COMPREHENSIVE METABOLIC PANEL - Abnormal; Notable for the following components:   Sodium 128 (*)    Chloride 94 (*)    Glucose, Bld 114 (*)    Creatinine, Ser 0.56 (*)    Calcium 8.6 (*)    All other components within normal limits  URINE CULTURE  TROPONIN I  TROPONIN I   ____________________________________________  EKG  ED ECG REPORT I, [place] N Malicia Blasdel, the attending physician, personally viewed and interpreted this ECG.   Date: 05/30/2018  EKG Time: 4:17 AM  Rate: 102  Rhythm: Sinus tachycardia  Axis: Normal  Intervals: Normal  ST&T Change: None  ____________________________________________  RADIOLOGY I, [place] Ernst Bowler, personally viewed and evaluated these images (plain radiographs) as part of my medical decision making, as well as reviewing the written report by the radiologist.  ED MD interpretation:    Official radiology report(s): Ct Angio Chest Pe W And/or Wo Contrast  Result Date: 05/30/2018 CLINICAL DATA:  Hypoxia, left shoulder pain. EXAM: CT ANGIOGRAPHY CHEST WITH CONTRAST TECHNIQUE: Multidetector CT imaging of the chest was performed using the standard protocol during bolus administration of intravenous contrast. Multiplanar CT image reconstructions and MIPs were obtained to evaluate the vascular anatomy. CONTRAST:  75mL OMNIPAQUE IOHEXOL 350 MG/ML SOLN COMPARISON:  Radiograph of same day.  CT scan of September 18, 2017. FINDINGS: Cardiovascular:  Satisfactory opacification of the pulmonary arteries to the segmental level. No evidence of pulmonary embolism. Mild cardiomegaly is noted. Coronary artery calcifications are noted. No pericardial effusion. Atherosclerosis of thoracic aorta is noted without aneurysm or dissection. Mediastinum/Nodes: No enlarged mediastinal, hilar, or  axillary lymph nodes. Thyroid gland, trachea, and esophagus demonstrate no significant findings. Lungs/Pleura: No pneumothorax or significant pleural effusion is noted. Stable left basilar scarring or atelectasis is noted. Mild right basilar subsegmental atelectasis is noted. Upper Abdomen: No acute abnormality. Musculoskeletal: No chest wall abnormality. No acute or significant osseous findings. Review of the MIP images confirms the above findings. IMPRESSION: No definite evidence of pulmonary embolus. Coronary artery calcifications are noted suggesting coronary artery disease. Stable left basilar scarring or atelectasis is noted. Mild right basilar subsegmental atelectasis is noted. Aortic Atherosclerosis (ICD10-I70.0). Electronically Signed   By: Marijo Conception, M.D.   On: 05/30/2018 13:39   Dg Chest Port 1 View  Result Date: 05/30/2018 CLINICAL DATA:  Tachycardia, hypoxia EXAM: PORTABLE CHEST 1 VIEW COMPARISON:  05/05/2018 chest radiograph. FINDINGS: Stable cardiomediastinal silhouette with normal heart size. No pneumothorax. No pleural effusion. Low lung volumes. No pulmonary edema. Hazy streaky bibasilar lung opacities, similar to prior. IMPRESSION: Low lung volumes with hazy streaky bibasilar lung opacities, similar to prior, favor scarring or atelectasis. Electronically Signed   By: Ilona Sorrel M.D.   On: 05/30/2018 12:11   Dg Humerus Left  Result Date: 05/30/2018 CLINICAL DATA:  Increasing pain to the left arm after injury last night. EXAM: LEFT HUMERUS - 2+ VIEW COMPARISON:  None. FINDINGS: Prominent degenerative changes in the glenohumeral joint with focal bone on  bone inferiorly. Remodeling of the glenoid and humeral surfaces. No evidence of acute fracture or dislocation of the left humerus. Mild widening of the acromioclavicular joint likely indicating postoperative change or resorption. No change since previous chest radiograph from 10/20/2017. Calcified granuloma in the left mid lung. Soft tissues are unremarkable. IMPRESSION: Severe degenerative changes in the left shoulder. No acute bony abnormalities. Electronically Signed   By: Lucienne Capers M.D.   On: 05/30/2018 06:20      Procedures   ____________________________________________   INITIAL IMPRESSION / ASSESSMENT AND PLAN / ED COURSE  As part of my medical decision making, I reviewed the following data within the electronic MEDICAL RECORD NUMBER   84 year old male presenting with above-stated history of reproducible bilateral shoulder pain left worse than right.  Patient given IV morphine which she states gave him minimal relief of his left shoulder pain.  Patient given Toradol 15 mg IV.  Suspect musculoskeletal etiology of the patient's pain more specifically tender to his history of rheumatoid arthritis.  Patient's oxygen saturation did decrease after receiving morphine and as such 2 L nasal cannula was applied patient's current oxygen saturation 96%.  Patient informed me that he took 2 Xanax before presenting to the emergency department.  Patient's care transferred to Dr. Joni Fears suspect patient will be suitable for discharge home when alert oriented x4 and able to ambulate without difficulty.     Clinical Course as of May 30 2301  Wed May 30, 2018  1128 Pt awake, alert. So2 87% RA. HR 115. After I liter bolus. Hyponatremia chronic due to SIADH. EMR reviewed, had HR 77 at recent hospital discharge in May 2019.   Will need to continue workup with cxr. Bp stable.   [PS]    Clinical Course User Index [PS] Carrie Mew, MD    ____________________________________________  FINAL  CLINICAL IMPRESSION(S) / ED DIAGNOSES  Final diagnoses:  Arthritis of left shoulder region     MEDICATIONS GIVEN DURING THIS VISIT:  Medications  morphine 4 MG/ML injection 4 mg (4 mg Intravenous Given 05/30/18 0432)  ondansetron (ZOFRAN) injection 4 mg (4 mg  Intravenous Given 05/30/18 0432)  methylPREDNISolone sodium succinate (SOLU-MEDROL) 125 mg/2 mL injection 125 mg (125 mg Intravenous Given 05/30/18 0432)  ketorolac (TORADOL) 30 MG/ML injection 15 mg (15 mg Intravenous Given 05/30/18 0532)  sodium chloride 0.9 % bolus 1,000 mL (0 mLs Intravenous Stopped 05/30/18 1150)  iohexol (OMNIPAQUE) 350 MG/ML injection 75 mL (75 mLs Intravenous Contrast Given 05/30/18 1313)  acetaminophen (TYLENOL) tablet 1,000 mg (1,000 mg Oral Given 05/30/18 1340)     ED Discharge Orders        Ordered    traMADol (ULTRAM) 50 MG tablet  3 times daily PRN     05/30/18 0659       Note:  This document was prepared using Dragon voice recognition software and may include unintentional dictation errors.    Gregor Hams, MD 05/30/18 [phone]

## 2018-06-01 ENCOUNTER — Telehealth: Payer: Self-pay

## 2018-06-01 DIAGNOSIS — M25511 Pain in right shoulder: Secondary | ICD-10-CM | POA: Diagnosis not present

## 2018-06-01 DIAGNOSIS — J45909 Unspecified asthma, uncomplicated: Secondary | ICD-10-CM | POA: Diagnosis not present

## 2018-06-01 DIAGNOSIS — M545 Low back pain: Secondary | ICD-10-CM | POA: Diagnosis not present

## 2018-06-01 DIAGNOSIS — G8929 Other chronic pain: Secondary | ICD-10-CM | POA: Diagnosis not present

## 2018-06-01 DIAGNOSIS — N319 Neuromuscular dysfunction of bladder, unspecified: Secondary | ICD-10-CM | POA: Diagnosis not present

## 2018-06-01 DIAGNOSIS — F329 Major depressive disorder, single episode, unspecified: Secondary | ICD-10-CM | POA: Diagnosis not present

## 2018-06-01 DIAGNOSIS — I10 Essential (primary) hypertension: Secondary | ICD-10-CM | POA: Diagnosis not present

## 2018-06-01 DIAGNOSIS — I2510 Atherosclerotic heart disease of native coronary artery without angina pectoris: Secondary | ICD-10-CM | POA: Diagnosis not present

## 2018-06-01 DIAGNOSIS — M25512 Pain in left shoulder: Secondary | ICD-10-CM | POA: Diagnosis not present

## 2018-06-01 NOTE — Telephone Encounter (Signed)
Spoke to pt's daughter. She said his arm and shoulder is doing better. Back to his Home PT

## 2018-06-04 ENCOUNTER — Ambulatory Visit (INDEPENDENT_AMBULATORY_CARE_PROVIDER_SITE_OTHER): Payer: Medicare PPO

## 2018-06-04 DIAGNOSIS — M0579 Rheumatoid arthritis with rheumatoid factor of multiple sites without organ or systems involvement: Secondary | ICD-10-CM | POA: Diagnosis not present

## 2018-06-04 DIAGNOSIS — Z79899 Other long term (current) drug therapy: Secondary | ICD-10-CM | POA: Diagnosis not present

## 2018-06-04 DIAGNOSIS — J4540 Moderate persistent asthma, uncomplicated: Secondary | ICD-10-CM

## 2018-06-05 MED ORDER — OMALIZUMAB 150 MG ~~LOC~~ SOLR
375.0000 mg | SUBCUTANEOUS | Status: DC
  Administered 2018-06-04: 375 mg via SUBCUTANEOUS

## 2018-06-05 NOTE — Progress Notes (Signed)
Documentation of medication administration and charges of Xolair have been completed by Lindsay Lemons, CMA based on the Xolair documentation sheet completed by Tammy Scott.  

## 2018-06-06 ENCOUNTER — Encounter: Payer: Self-pay | Admitting: Internal Medicine

## 2018-06-06 ENCOUNTER — Ambulatory Visit: Payer: Medicare PPO | Admitting: Internal Medicine

## 2018-06-06 VITALS — BP 106/72 | HR 80 | Temp 98.0°F | Ht 71.0 in | Wt 200.0 lb

## 2018-06-06 DIAGNOSIS — I2510 Atherosclerotic heart disease of native coronary artery without angina pectoris: Secondary | ICD-10-CM | POA: Diagnosis not present

## 2018-06-06 DIAGNOSIS — M25511 Pain in right shoulder: Secondary | ICD-10-CM | POA: Diagnosis not present

## 2018-06-06 DIAGNOSIS — M25512 Pain in left shoulder: Secondary | ICD-10-CM | POA: Diagnosis not present

## 2018-06-06 DIAGNOSIS — I6523 Occlusion and stenosis of bilateral carotid arteries: Secondary | ICD-10-CM

## 2018-06-06 DIAGNOSIS — G459 Transient cerebral ischemic attack, unspecified: Secondary | ICD-10-CM | POA: Diagnosis not present

## 2018-06-06 DIAGNOSIS — M545 Low back pain: Secondary | ICD-10-CM | POA: Diagnosis not present

## 2018-06-06 DIAGNOSIS — J45909 Unspecified asthma, uncomplicated: Secondary | ICD-10-CM | POA: Diagnosis not present

## 2018-06-06 DIAGNOSIS — N319 Neuromuscular dysfunction of bladder, unspecified: Secondary | ICD-10-CM | POA: Diagnosis not present

## 2018-06-06 DIAGNOSIS — I10 Essential (primary) hypertension: Secondary | ICD-10-CM | POA: Diagnosis not present

## 2018-06-06 DIAGNOSIS — I739 Peripheral vascular disease, unspecified: Secondary | ICD-10-CM

## 2018-06-06 DIAGNOSIS — F329 Major depressive disorder, single episode, unspecified: Secondary | ICD-10-CM | POA: Diagnosis not present

## 2018-06-06 DIAGNOSIS — G8929 Other chronic pain: Secondary | ICD-10-CM | POA: Diagnosis not present

## 2018-06-06 DIAGNOSIS — I779 Disorder of arteries and arterioles, unspecified: Secondary | ICD-10-CM | POA: Insufficient documentation

## 2018-06-06 NOTE — Assessment & Plan Note (Signed)
Bilateral sub critical stenosis No action indicated

## 2018-06-06 NOTE — Assessment & Plan Note (Signed)
Expressive aphasia suggests left frontal defect Might have been related to hair washing etc--if it caused temporary hypoperfusion No flow limiting carotid stenosis Mild issues persist---but mostly better Now on higher dose asa No further action indicated now No BP meds  On statin

## 2018-06-06 NOTE — Progress Notes (Signed)
Subjective:    Patient ID: [name], male    DOB: 02-26-1934, 84 y.o.   MRN: [id number]  HPI Here for hospital follow up --with daughter Was admitted with expressive aphasia  Got haircut in hair salon---- leaning back against sink (for extended time) Then was sitting in chair getting haircut---couldn't get his words out Daughter was with him He then couldn't figure out paying for the haircut Daughter got him in car---- then more concern for stroke No dizziness or syncope. No headache. No chest pain or SOB Symptoms only lasted an hour at most (maybe as little as 30 minutes)  Brought him to ER Taken in for CT scan--nothing seen MRI showed old infarct and chronic small vessel disease Echo showed LVH and diastolic dysfunction. No embolic source Bilateral mild carotid stenosis noted Reviewed hospital records ASA increased to 325mg   Then, got "violent" attack of diarrhea in restaurant Had real struggle to get up off commode in bathroom Then had a lot of bad arm pain---so bad it prompted ER visit Reviewed this record as well X-ray showed degenerative changes Got IV morphine--then hypoxia and got work up due to this (CT angio--okay) Then got toradol IV also biofreeze did really help--got some himself and has continued to use this  Current Outpatient Medications on File Prior to Visit  Medication Sig Dispense Refill  . albuterol (PROVENTIL HFA;VENTOLIN HFA) 108 (90 Base) MCG/ACT inhaler Inhale 2 puffs into the lungs every 6 (six) hours as needed for wheezing or shortness of breath. 1 Inhaler 3  . ALPRAZolam (XANAX) 0.5 MG tablet TAKE 1 TABLET BY MOUTH THREE TIMES DAILY AS NEEDED FOR ANXIETY OR SLEEP 90 tablet 0  . aspirin 325 MG tablet Take 1 tablet (325 mg total) by mouth daily.    . Cholecalciferol (VITAMIN D3) 5000 UNITS CAPS Take 1 capsule by mouth daily.    . Coenzyme Q10 (CO Q 10) 100 MG CAPS Take 1 capsule by mouth daily.    . Cranberry 425 MG CAPS Take 2 capsules by  mouth daily.    . fluticasone (FLONASE) 50 MCG/ACT nasal spray Place 2 sprays into both nostrils 2 (two) times daily. 48 g 3  . folic acid (FOLVITE) 1 MG tablet Take 1 tablet (1 mg total) by mouth daily. 90 tablet 3  . furosemide (LASIX) 20 MG tablet TAKE 1 TABLET BY MOUTH EVERY DAY 30 tablet 11  . gabapentin (NEURONTIN) 300 MG capsule TAKE 1 CAPSULE BY MOUTH THREE TIMES DAILY 90 capsule 11  . hydroxychloroquine (PLAQUENIL) 200 MG tablet Take 1 tablet by mouth 2 (two) times daily.    . methotrexate (RHEUMATREX) 2.5 MG tablet Take 10 tablets by mouth once a week.    . Milk Thistle 250 MG CAPS Take 1 capsule by mouth daily.    . Multiple Vitamin (MULTIVITAMIN) tablet Take 1 tablet by mouth daily.      Marland Kitchen nystatin (MYCOSTATIN/NYSTOP) powder Apply topically 4 (four) times daily. 15 g 2  . omeprazole (PRILOSEC) 20 MG capsule Take 1 capsule (20 mg total) by mouth daily as needed. 90 capsule 1  . polyethylene glycol (GLYCOLAX) packet Take 34 g by mouth daily.     . pravastatin (PRAVACHOL) 20 MG tablet TAKE 1 TABLET BY MOUTH EVERY DAY 90 tablet 3  . Probiotic Product (ALIGN) 4 MG CAPS Take 1 capsule (4 mg total) by mouth daily. 15 capsule 0  . Selenium 200 MCG CAPS Take 1 capsule by mouth daily.    Marland Kitchen  traMADol (ULTRAM) 50 MG tablet Take 2 tablets (100 mg total) by mouth 3 (three) times daily as needed. 120 tablet 0  . Turmeric 500 MG CAPS Take 500 mg by mouth daily.    . vitamin B-12 (CYANOCOBALAMIN) 500 MCG tablet Take 500 mcg by mouth daily.     Current Facility-Administered Medications on File Prior to Visit  Medication Dose Route Frequency Provider Last Rate Last Dose  . omalizumab Arvid Right) injection 375 mg  375 mg Subcutaneous Q14 Days Chesley Mires, MD   375 mg at 06/04/18 0935    Allergies  Allergen Reactions  . Ciprofloxacin Nausea And Vomiting    Headache  . Citalopram Hydrobromide Other (See Comments)    "unknown"  . Lorazepam     Adverse reaction  . Paroxetine Nausea Only  . Ramipril  Other (See Comments)    "unknown"  . Simvastatin   . Sulfa Antibiotics Other (See Comments)    Past Medical History:  Diagnosis Date  . Allergy   . Anemia   . Anxiety   . Arthritis    rheumatoid  . Asthma   . CAD (coronary artery disease)   . Cancer [facility])    Prostate  . Chronic sinusitis   . Depression   . Diverticulitis   . ED (erectile dysfunction)   . GERD (gastroesophageal reflux disease)   . History of SIADH   . Hx of adenomatous colonic polyps   . Hypertension   . Hyponatremia   . IBS (irritable bowel syndrome)   . Neurodermatitis   . Neurogenic bladder   . OSA (obstructive sleep apnea)     Past Surgical History:  Procedure Laterality Date  . KNEE ARTHROPLASTY Right 09/02/2015   Procedure: COMPUTER ASSISTED TOTAL KNEE ARTHROPLASTY;  Surgeon: [name], MD;  Location: [facility] ORS;  Service: Orthopedics;  Laterality: Right;  . KNEE SURGERY Left    ARTHROSCOPY LEFT  . NASAL SINUS SURGERY  2009   DEVIATED SEPTUM AND POLYPS  . PROSTATE SURGERY     PROSTATECTOMY  . TOTAL KNEE ARTHROPLASTY Left 9/15   Dr [name]  . URETHRAL STRICTURE DILATATION  02-2010   Dr.Cope    Family History  Problem Relation Age of Onset  . Heart disease Mother 75       heart failure  . Pneumonia Father 104       pnemonia  . Colon cancer Neg Hx     Social History   Socioeconomic History  . Marital status: Married    Spouse name: Not on file  . Number of children: 2  . Years of education: Not on file  . Highest education level: Not on file  Occupational History  . 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 Financial resource strain: Not on file  . Food insecurity:    Worry: Not on file    Inability: Not on file  . Transportation needs:    Medical: Not on file    Non-medical: Not on file  Tobacco Use  . Smoking status: Never Smoker  . Smokeless tobacco: Never Used  Substance and Sexual Activity  . Alcohol use: Yes    Alcohol/week: 4.8 oz     Types: 8 Standard drinks or equivalent per week  . Drug use: No  . Sexual activity: Not on file  Lifestyle  . Physical activity:    Days per week: Not on file    Minutes per session: Not on file  .  Stress: Not on file  Relationships  . Social connections:    Talks on phone: Not on file    Gets together: Not on file    Attends religious service: Not on file    Active member of club or organization: Not on file    Attends meetings of clubs or organizations: Not on file    Relationship status: Not on file  . Intimate partner violence:    Fear of current or ex partner: Not on file    Emotionally abused: Not on file    Physically abused: Not on file    Forced sexual activity: Not on file  Other Topics Concern  . Not on file  Social History Narrative   Not sure about a living will or health care POA   Wife should make health care decisions for him--then daughter, then son   Would accept resuscitation attempts   Not sure about tube feeds   Review of Systems Ongoing sleep problems---wonders about medications Feels his RA is worsening Some AM queasiness  Appetite not great    Objective:   Physical Exam  Constitutional: He is oriented to person, place, and time.  Cardiovascular: Normal rate, regular rhythm and normal heart sounds. Exam reveals no gallop.  No murmur heard. Respiratory: Effort normal and breath sounds normal. No respiratory distress. He has no wheezes. He has no rales.  Neurological: He is alert and oriented to person, place, and time. No cranial nerve deficit. He exhibits normal muscle tone.  Unable to get up on table Mild leg weakness--more on right Still not getting all his words out correctly--but not striking Gait very slow with walker           Assessment & Plan:

## 2018-06-07 DIAGNOSIS — M545 Low back pain: Secondary | ICD-10-CM | POA: Diagnosis not present

## 2018-06-07 DIAGNOSIS — I2510 Atherosclerotic heart disease of native coronary artery without angina pectoris: Secondary | ICD-10-CM | POA: Diagnosis not present

## 2018-06-07 DIAGNOSIS — M25512 Pain in left shoulder: Secondary | ICD-10-CM | POA: Diagnosis not present

## 2018-06-07 DIAGNOSIS — I10 Essential (primary) hypertension: Secondary | ICD-10-CM | POA: Diagnosis not present

## 2018-06-07 DIAGNOSIS — M25511 Pain in right shoulder: Secondary | ICD-10-CM | POA: Diagnosis not present

## 2018-06-08 DIAGNOSIS — F329 Major depressive disorder, single episode, unspecified: Secondary | ICD-10-CM | POA: Diagnosis not present

## 2018-06-08 DIAGNOSIS — I2510 Atherosclerotic heart disease of native coronary artery without angina pectoris: Secondary | ICD-10-CM | POA: Diagnosis not present

## 2018-06-08 DIAGNOSIS — J45909 Unspecified asthma, uncomplicated: Secondary | ICD-10-CM | POA: Diagnosis not present

## 2018-06-08 DIAGNOSIS — I10 Essential (primary) hypertension: Secondary | ICD-10-CM | POA: Diagnosis not present

## 2018-06-08 DIAGNOSIS — M25511 Pain in right shoulder: Secondary | ICD-10-CM | POA: Diagnosis not present

## 2018-06-08 DIAGNOSIS — G8929 Other chronic pain: Secondary | ICD-10-CM | POA: Diagnosis not present

## 2018-06-08 DIAGNOSIS — M25512 Pain in left shoulder: Secondary | ICD-10-CM | POA: Diagnosis not present

## 2018-06-08 DIAGNOSIS — M545 Low back pain: Secondary | ICD-10-CM | POA: Diagnosis not present

## 2018-06-08 DIAGNOSIS — N319 Neuromuscular dysfunction of bladder, unspecified: Secondary | ICD-10-CM | POA: Diagnosis not present

## 2018-06-11 DIAGNOSIS — M25511 Pain in right shoulder: Secondary | ICD-10-CM | POA: Diagnosis not present

## 2018-06-11 DIAGNOSIS — Z79899 Other long term (current) drug therapy: Secondary | ICD-10-CM | POA: Diagnosis not present

## 2018-06-11 DIAGNOSIS — G8929 Other chronic pain: Secondary | ICD-10-CM | POA: Diagnosis not present

## 2018-06-11 DIAGNOSIS — M150 Primary generalized (osteo)arthritis: Secondary | ICD-10-CM | POA: Diagnosis not present

## 2018-06-11 DIAGNOSIS — M0579 Rheumatoid arthritis with rheumatoid factor of multiple sites without organ or systems involvement: Secondary | ICD-10-CM | POA: Diagnosis not present

## 2018-06-14 DIAGNOSIS — G453 Amaurosis fugax: Secondary | ICD-10-CM | POA: Diagnosis not present

## 2018-06-17 ENCOUNTER — Other Ambulatory Visit: Payer: Self-pay | Admitting: Internal Medicine

## 2018-06-17 ENCOUNTER — Other Ambulatory Visit: Payer: Self-pay | Admitting: Family Medicine

## 2018-06-18 DIAGNOSIS — N319 Neuromuscular dysfunction of bladder, unspecified: Secondary | ICD-10-CM | POA: Diagnosis not present

## 2018-06-18 DIAGNOSIS — J45909 Unspecified asthma, uncomplicated: Secondary | ICD-10-CM | POA: Diagnosis not present

## 2018-06-18 DIAGNOSIS — G8929 Other chronic pain: Secondary | ICD-10-CM | POA: Diagnosis not present

## 2018-06-18 DIAGNOSIS — I10 Essential (primary) hypertension: Secondary | ICD-10-CM | POA: Diagnosis not present

## 2018-06-18 DIAGNOSIS — I2510 Atherosclerotic heart disease of native coronary artery without angina pectoris: Secondary | ICD-10-CM | POA: Diagnosis not present

## 2018-06-18 DIAGNOSIS — M25512 Pain in left shoulder: Secondary | ICD-10-CM | POA: Diagnosis not present

## 2018-06-18 DIAGNOSIS — M545 Low back pain: Secondary | ICD-10-CM | POA: Diagnosis not present

## 2018-06-18 DIAGNOSIS — F329 Major depressive disorder, single episode, unspecified: Secondary | ICD-10-CM | POA: Diagnosis not present

## 2018-06-18 DIAGNOSIS — M25511 Pain in right shoulder: Secondary | ICD-10-CM | POA: Diagnosis not present

## 2018-06-18 NOTE — Telephone Encounter (Signed)
Name of Medication: tramadol 50 mg Name of Pharmacy: [facility] [address] [place] or Written Date and Quantity: # 120 on 05/30/18 Last Office Visit and Type: 06/06/18 Next Office Visit and Type: 09/03/18 CPX Last Controlled Substance Agreement Date: none Last UDS:05/26/18

## 2018-06-18 NOTE — Telephone Encounter (Signed)
Ok to Rf in PCP's absence? LOV: 06/06/18 Next OV w/PCP: 09/03/18

## 2018-06-18 NOTE — Telephone Encounter (Signed)
Refilled times 1 in pcp absence

## 2018-06-19 DIAGNOSIS — D649 Anemia, unspecified: Secondary | ICD-10-CM | POA: Diagnosis not present

## 2018-06-19 DIAGNOSIS — R600 Localized edema: Secondary | ICD-10-CM | POA: Diagnosis not present

## 2018-06-19 DIAGNOSIS — E871 Hypo-osmolality and hyponatremia: Secondary | ICD-10-CM | POA: Diagnosis not present

## 2018-06-19 NOTE — Telephone Encounter (Signed)
Not due until 8/3

## 2018-06-20 DIAGNOSIS — Z8673 Personal history of transient ischemic attack (TIA), and cerebral infarction without residual deficits: Secondary | ICD-10-CM | POA: Diagnosis not present

## 2018-06-20 DIAGNOSIS — H539 Unspecified visual disturbance: Secondary | ICD-10-CM | POA: Diagnosis not present

## 2018-06-21 ENCOUNTER — Ambulatory Visit (INDEPENDENT_AMBULATORY_CARE_PROVIDER_SITE_OTHER): Payer: Medicare PPO

## 2018-06-21 DIAGNOSIS — J4540 Moderate persistent asthma, uncomplicated: Secondary | ICD-10-CM | POA: Diagnosis not present

## 2018-06-22 ENCOUNTER — Telehealth: Payer: Self-pay | Admitting: Pulmonary Disease

## 2018-06-22 DIAGNOSIS — M545 Low back pain: Secondary | ICD-10-CM | POA: Diagnosis not present

## 2018-06-22 DIAGNOSIS — F329 Major depressive disorder, single episode, unspecified: Secondary | ICD-10-CM | POA: Diagnosis not present

## 2018-06-22 DIAGNOSIS — I2510 Atherosclerotic heart disease of native coronary artery without angina pectoris: Secondary | ICD-10-CM | POA: Diagnosis not present

## 2018-06-22 DIAGNOSIS — M25511 Pain in right shoulder: Secondary | ICD-10-CM | POA: Diagnosis not present

## 2018-06-22 DIAGNOSIS — I10 Essential (primary) hypertension: Secondary | ICD-10-CM | POA: Diagnosis not present

## 2018-06-22 DIAGNOSIS — J45909 Unspecified asthma, uncomplicated: Secondary | ICD-10-CM | POA: Diagnosis not present

## 2018-06-22 DIAGNOSIS — N319 Neuromuscular dysfunction of bladder, unspecified: Secondary | ICD-10-CM | POA: Diagnosis not present

## 2018-06-22 DIAGNOSIS — G8929 Other chronic pain: Secondary | ICD-10-CM | POA: Diagnosis not present

## 2018-06-22 DIAGNOSIS — M25512 Pain in left shoulder: Secondary | ICD-10-CM | POA: Diagnosis not present

## 2018-06-25 ENCOUNTER — Telehealth: Payer: Self-pay | Admitting: Pulmonary Disease

## 2018-06-25 ENCOUNTER — Telehealth: Payer: Self-pay | Admitting: Internal Medicine

## 2018-06-25 DIAGNOSIS — Z466 Encounter for fitting and adjustment of urinary device: Secondary | ICD-10-CM | POA: Diagnosis not present

## 2018-06-25 NOTE — Telephone Encounter (Signed)
I spoke to patient and he said he has home health coming out every other day, so it will probably be better for him to keep the appointment on 07/13/18.

## 2018-06-25 NOTE — Telephone Encounter (Signed)
Copied from [place] [phone]. Topic: Appointment Scheduling - Scheduling Inquiry for Clinic >> Jun 25, 2018 11:09 AM Nils Flack wrote: Reason for CRM: pt went to eye doctor and then the neurologist.  He was told to follow up and I offered 08/14 for Dr Silvio Pate.  He says he thinks he needs to be seen sooner than that. Is there a way that pt can be worked in sooner? Please call [phone]   Can patient wait until 07/11/18 or do you want me to use a same day appointment?

## 2018-06-25 NOTE — Telephone Encounter (Signed)
Go ahead and use a same day if you have to

## 2018-06-25 NOTE — Telephone Encounter (Signed)
Error

## 2018-06-25 NOTE — Telephone Encounter (Signed)
Okay; thanks.

## 2018-06-25 NOTE — Telephone Encounter (Addendum)
Prefilled Syringe: #150mg  2  #75mg  1 Ordered Date: 06/22/18 Shipping Date: 06/24/18

## 2018-06-25 NOTE — Telephone Encounter (Addendum)
Prefilled Syringes: # 150mg  2  #75mg  1 Arrival Date: 7/29/19Lot #:  [id number]  [id number]      75mg  [id number] Exp Date: 150mg  11/2018   75mg  12/2018

## 2018-06-26 DIAGNOSIS — J45909 Unspecified asthma, uncomplicated: Secondary | ICD-10-CM | POA: Diagnosis not present

## 2018-06-26 DIAGNOSIS — F329 Major depressive disorder, single episode, unspecified: Secondary | ICD-10-CM | POA: Diagnosis not present

## 2018-06-26 DIAGNOSIS — M25511 Pain in right shoulder: Secondary | ICD-10-CM | POA: Diagnosis not present

## 2018-06-26 DIAGNOSIS — I2510 Atherosclerotic heart disease of native coronary artery without angina pectoris: Secondary | ICD-10-CM | POA: Diagnosis not present

## 2018-06-26 DIAGNOSIS — M25512 Pain in left shoulder: Secondary | ICD-10-CM | POA: Diagnosis not present

## 2018-06-26 DIAGNOSIS — N319 Neuromuscular dysfunction of bladder, unspecified: Secondary | ICD-10-CM | POA: Diagnosis not present

## 2018-06-26 DIAGNOSIS — I10 Essential (primary) hypertension: Secondary | ICD-10-CM | POA: Diagnosis not present

## 2018-06-26 DIAGNOSIS — G8929 Other chronic pain: Secondary | ICD-10-CM | POA: Diagnosis not present

## 2018-06-26 DIAGNOSIS — M545 Low back pain: Secondary | ICD-10-CM | POA: Diagnosis not present

## 2018-06-26 MED ORDER — OMALIZUMAB 150 MG ~~LOC~~ SOLR
375.0000 mg | SUBCUTANEOUS | Status: DC
  Administered 2018-06-21: 375 mg via SUBCUTANEOUS

## 2018-06-26 NOTE — Progress Notes (Signed)
Documentation of medication administration and charges of Xolair have been completed by Lindsay Lemons, CMA based on the Xolair documentation sheet completed by Tammy Scott.  

## 2018-07-02 ENCOUNTER — Telehealth: Payer: Self-pay | Admitting: Pulmonary Disease

## 2018-07-03 ENCOUNTER — Ambulatory Visit: Payer: Medicare PPO | Admitting: Urology

## 2018-07-03 ENCOUNTER — Encounter: Payer: Self-pay | Admitting: Urology

## 2018-07-03 VITALS — BP 155/78 | HR 83 | Temp 98.2°F | Ht 70.0 in | Wt 198.0 lb

## 2018-07-03 DIAGNOSIS — N319 Neuromuscular dysfunction of bladder, unspecified: Secondary | ICD-10-CM | POA: Diagnosis not present

## 2018-07-03 DIAGNOSIS — R399 Unspecified symptoms and signs involving the genitourinary system: Secondary | ICD-10-CM | POA: Diagnosis not present

## 2018-07-03 NOTE — Telephone Encounter (Signed)
Prefilled Syringes: # 150mg  2  #75mg  1 Arrival Date: 07/03/18 Lot #: 150mg  [id number]      75mg [id number] Exp Date: 150mg  11/2018   75mg  12/2018

## 2018-07-03 NOTE — Telephone Encounter (Signed)
Prefilled Syringe: #150mg  2  #75mg  1 Ordered Date: 07/02/18 Shipping Date: 07/02/18

## 2018-07-04 NOTE — Progress Notes (Signed)
07/03/2018 7:37 AM   [name] 17-Jun-1934 [id number]  Referring provider: [name], MD [address] [place], [place] [address]  Chief Complaint  Patient presents with  . Establish Care    HPI: 84 year old male presents to ED establish local urologic care.  He has a long-standing patient of Dr. [name] and has most recently been followed by him at [facility].  Prior records are not available for review however he apparently developed a small capacity bladder after salvage radiation for prostate cancer in 1998 however had symptomatic urinary retention.  He initially tried intermittent catheterization and has been Foley catheter dependent for several years.  He underwent a radical prostatectomy in 1993.  He estimates he has 2 symptomatic UTIs per year.  Record review negative for recent upper tract imaging.  Dr. [name] had apparently recommended video urodynamics prior to his departure however after review by Dr. Alto Denver it was recommended not to proceed with this study due to minimal available management options.   PMH: Past Medical History:  Diagnosis Date  . Allergy   . Anemia   . Anxiety   . Arthritis    rheumatoid  . Asthma   . CAD (coronary artery disease)   . Cancer [facility])    Prostate  . Chronic sinusitis   . Depression   . Diverticulitis   . ED (erectile dysfunction)   . GERD (gastroesophageal reflux disease)   . History of SIADH   . Hx of adenomatous colonic polyps   . Hypertension   . Hyponatremia   . IBS (irritable bowel syndrome)   . Neurodermatitis   . Neurogenic bladder   . OSA (obstructive sleep apnea)     Surgical History: Past Surgical History:  Procedure Laterality Date  . KNEE ARTHROPLASTY Right 09/02/2015   Procedure: COMPUTER ASSISTED TOTAL KNEE ARTHROPLASTY;  Surgeon: [name], MD;  Location: [facility] ORS;  Service: Orthopedics;  Laterality: Right;  . KNEE SURGERY Left    ARTHROSCOPY LEFT  . NASAL SINUS SURGERY  2009   DEVIATED SEPTUM  AND POLYPS  . PROSTATE SURGERY     PROSTATECTOMY  . TOTAL KNEE ARTHROPLASTY Left 9/15   Dr [name]  . URETHRAL STRICTURE DILATATION  02-2010   Dr.Cope    Home Medications:  Allergies as of 07/03/2018      Reactions   Ciprofloxacin Nausea And Vomiting   Headache   Citalopram Hydrobromide Other (See Comments)   "unknown"   Lorazepam    Adverse reaction   Paroxetine Nausea Only   Ramipril Other (See Comments)   "unknown"   Simvastatin    Sulfa Antibiotics Other (See Comments)      Medication List        Accurate as of 07/03/18 11:59 PM. Always use your most recent med list.          albuterol 108 (90 Base) MCG/ACT inhaler Commonly known as:  PROVENTIL HFA;VENTOLIN HFA Inhale 2 puffs into the lungs every 6 (six) hours as needed for wheezing or shortness of breath.   ALIGN 4 MG Caps Take 1 capsule (4 mg total) by mouth daily.   ALPRAZolam 0.5 MG tablet Commonly known as:  XANAX TAKE 1 TABLET BY MOUTH THREE TIMES DAILY AS NEEDED FOR ANXIETY OR SLEEP   aspirin 325 MG tablet Take 1 tablet (325 mg total) by mouth daily.   clopidogrel 75 MG tablet Commonly known as:  PLAVIX   Co Q 10 100 MG Caps Take 1 capsule by  mouth daily.   Cranberry 425 MG Caps Take 2 capsules by mouth daily.   fluticasone 50 MCG/ACT nasal spray Commonly known as:  FLONASE Place 2 sprays into both nostrils 2 (two) times daily.   folic acid 1 MG tablet Commonly known as:  FOLVITE Take 1 tablet (1 mg total) by mouth daily.   furosemide 20 MG tablet Commonly known as:  LASIX TAKE 1 TABLET BY MOUTH EVERY DAY   gabapentin 300 MG capsule Commonly known as:  NEURONTIN TAKE 1 CAPSULE BY MOUTH THREE TIMES DAILY   hydroxychloroquine 200 MG tablet Commonly known as:  PLAQUENIL Take 1 tablet by mouth 2 (two) times daily.   ibuprofen 400 MG tablet Commonly known as:  ADVIL,MOTRIN Take by mouth.   methotrexate 2.5 MG tablet Commonly known as:  RHEUMATREX Take 10 tablets by mouth once a week.    Milk Thistle 250 MG Caps Take 1 capsule by mouth daily.   multivitamin tablet Take 1 tablet by mouth daily.   MYRBETRIQ 50 MG Tb24 tablet Generic drug:  mirabegron ER   nystatin powder Commonly known as:  MYCOSTATIN/NYSTOP Apply topically 4 (four) times daily.   omeprazole 20 MG capsule Commonly known as:  PRILOSEC Take 1 capsule (20 mg total) by mouth daily as needed.   polyethylene glycol packet Commonly known as:  MIRALAX / GLYCOLAX Take 34 g by mouth daily.   pravastatin 20 MG tablet Commonly known as:  PRAVACHOL TAKE 1 TABLET BY MOUTH EVERY DAY   Selenium 200 MCG Caps Take 1 capsule by mouth daily.   traMADol 50 MG tablet Commonly known as:  ULTRAM Take 2 tablets (100 mg total) by mouth 3 (three) times daily as needed.   Turmeric 500 MG Caps Take 500 mg by mouth daily.   vitamin B-12 500 MCG tablet Commonly known as:  CYANOCOBALAMIN Take 500 mcg by mouth daily.   Vitamin D3 5000 units Caps Take 1 capsule by mouth daily.       Allergies:  Allergies  Allergen Reactions  . Ciprofloxacin Nausea And Vomiting    Headache  . Citalopram Hydrobromide Other (See Comments)    "unknown"  . Lorazepam     Adverse reaction  . Paroxetine Nausea Only  . Ramipril Other (See Comments)    "unknown"  . Simvastatin   . Sulfa Antibiotics Other (See Comments)    Family History: Family History  Problem Relation Age of Onset  . Heart disease Mother 33       heart failure  . Pneumonia Father 75       pnemonia  . Colon cancer Neg Hx     Social History:  reports that he has never smoked. He has never used smokeless tobacco. He reports that he drinks about 4.8 oz of alcohol per week. He reports that he does not use drugs.  ROS: UROLOGY Frequent Urination?: No Hard to postpone urination?: No Burning/pain with urination?: No Get up at night to urinate?: No Leakage of urine?: No Urine stream starts and stops?: No Trouble starting stream?: No Do you have to  strain to urinate?: No Blood in urine?: No Urinary tract infection?: No Sexually transmitted disease?: No Injury to kidneys or bladder?: No Painful intercourse?: No Weak stream?: No Erection problems?: No Penile pain?: No  Gastrointestinal Nausea?: No Vomiting?: No Indigestion/heartburn?: No Diarrhea?: No Constipation?: Yes  Constitutional Fever: No Night sweats?: No Weight loss?: Yes Fatigue?: No  Skin Skin rash/lesions?: No Itching?: No  Eyes Blurred vision?: No Double  vision?: No  Ears/Nose/Throat Sore throat?: No Sinus problems?: No  Hematologic/Lymphatic Swollen glands?: No Easy bruising?: Yes  Cardiovascular Leg swelling?: No Chest pain?: No  Respiratory Cough?: No Shortness of breath?: No  Endocrine Excessive thirst?: No  Musculoskeletal Back pain?: Yes Joint pain?: Yes  Neurological Headaches?: No Dizziness?: No  Psychologic Depression?: No Anxiety?: Yes  Physical Exam: BP (!) 155/78 (BP Location: Left Arm, Patient Position: Sitting, Cuff Size: Normal)   Pulse 83   Temp 98.2 F (36.8 C) (Oral)   Ht 5\' 10"  (1.778 m)   Wt 198 lb (89.8 kg)   BMI 28.41 kg/m   Constitutional:  Alert and oriented, No acute distress. HEENT: [place] AT, moist mucus membranes.  Trachea midline, no masses. Cardiovascular: No clubbing, cyanosis, or edema. Respiratory: Normal respiratory effort, no increased work of breathing. GI: Abdomen is soft, nontender, nondistended, no abdominal masses GU: No CVA tenderness Lymph: No cervical or inguinal lymphadenopathy. Skin: No rashes, bruises or suspicious lesions. Neurologic: Grossly intact, no focal deficits, moving all 4 extremities. Psychiatric: Normal mood and affect.   Assessment & Plan:   He states his catheter was not due to be changed today and an appointment will be scheduled for monthly catheter changes.  He has noted increased bladder spasms and thinks he may have an infection.  A urine culture was sent  and he will be notified with results.   Return in about 1 month (around 07/31/2018).  Abbie Sons, [place] [address] [place], [place] [address] [phone]

## 2018-07-09 ENCOUNTER — Ambulatory Visit: Payer: Medicare PPO

## 2018-07-09 ENCOUNTER — Inpatient Hospital Stay: Payer: Medicare PPO | Admitting: Internal Medicine

## 2018-07-09 DIAGNOSIS — J4540 Moderate persistent asthma, uncomplicated: Secondary | ICD-10-CM

## 2018-07-09 LAB — CULTURE, URINE COMPREHENSIVE

## 2018-07-09 MED ORDER — OMALIZUMAB 150 MG ~~LOC~~ SOLR
375.0000 mg | Freq: Once | SUBCUTANEOUS | Status: AC
  Administered 2018-07-09: 375 mg via SUBCUTANEOUS

## 2018-07-13 ENCOUNTER — Encounter: Payer: Self-pay | Admitting: Internal Medicine

## 2018-07-13 ENCOUNTER — Ambulatory Visit: Payer: Medicare PPO | Admitting: Internal Medicine

## 2018-07-13 VITALS — BP 128/84 | HR 67 | Temp 98.0°F | Ht 70.0 in | Wt 199.0 lb

## 2018-07-13 DIAGNOSIS — G459 Transient cerebral ischemic attack, unspecified: Secondary | ICD-10-CM | POA: Diagnosis not present

## 2018-07-13 DIAGNOSIS — E222 Syndrome of inappropriate secretion of antidiuretic hormone: Secondary | ICD-10-CM

## 2018-07-13 DIAGNOSIS — F39 Unspecified mood [affective] disorder: Secondary | ICD-10-CM | POA: Diagnosis not present

## 2018-07-13 LAB — RENAL FUNCTION PANEL
Albumin: 3.9 g/dL (ref 3.5–5.2)
BUN: 11 mg/dL (ref 6–23)
CALCIUM: 9.3 mg/dL (ref 8.4–10.5)
CHLORIDE: 96 meq/L (ref 96–112)
CO2: 34 meq/L — AB (ref 19–32)
CREATININE: 0.66 mg/dL (ref 0.40–1.50)
GFR: 122.77 mL/min (ref >60.00)
Glucose, Bld: 100 mg/dL — ABNORMAL HIGH (ref 70–99)
PHOSPHORUS: 3.8 mg/dL (ref 2.3–4.6)
POTASSIUM: 5 meq/L (ref 3.5–5.1)
Sodium: 133 mEq/L — ABNORMAL LOW (ref 135–145)

## 2018-07-13 NOTE — Assessment & Plan Note (Signed)
Will set back up with counselor

## 2018-07-13 NOTE — Assessment & Plan Note (Signed)
Briefly back on salt tablets--but back off it now Has a hard time with the fluid restriction---will recheck to see where he is at now

## 2018-07-13 NOTE — Assessment & Plan Note (Signed)
No further vision events since changing to plavix Still atypical but most likely etiology of the vision symptoms To add low dose aspirin if recurs---per Dr Melrose Nakayama

## 2018-07-13 NOTE — Progress Notes (Signed)
Subjective:    Patient ID: [name], male    DOB: 13-Oct-1934, 84 y.o.   MRN: [id number]  HPI Here withy wife due to concerns about vision episodes  Had "bunch of lines" in front of vision while watching TV--- "something blocking my vision" In front of both eyes Lasted 4-5 minutes 2 other episodes of about the same duration---once in car dealership and the other while watching TV  Went to eye doctor--thought these were vascular, not from eye Went to Dr Melrose Nakayama after that--reviewed his note Taken off ASA and put on clopidogrel Consideration for restarting lower dose aspirin 3 days per week No more spells since then  Also concerned about the hyponatremia Was normal here (135) then went to [facility] (and it was 133) and repeat 132 Restarted 1200cc fluid restriction ("extremely difficult") Took salt pills again for 3 weeks but then stopped-but not the furosemide  Current Outpatient Medications on File Prior to Visit  Medication Sig Dispense Refill  . albuterol (PROVENTIL HFA;VENTOLIN HFA) 108 (90 Base) MCG/ACT inhaler Inhale 2 puffs into the lungs every 6 (six) hours as needed for wheezing or shortness of breath. 1 Inhaler 3  . ALPRAZolam (XANAX) 0.5 MG tablet TAKE 1 TABLET BY MOUTH THREE TIMES DAILY AS NEEDED FOR ANXIETY OR SLEEP 90 tablet 0  . Cholecalciferol (VITAMIN D3) 5000 UNITS CAPS Take 1 capsule by mouth daily.    . clopidogrel (PLAVIX) 75 MG tablet   4  . Coenzyme Q10 (CO Q 10) 100 MG CAPS Take 1 capsule by mouth daily.    . Cranberry 425 MG CAPS Take 2 capsules by mouth daily.    . fluticasone (FLONASE) 50 MCG/ACT nasal spray Place 2 sprays into both nostrils 2 (two) times daily. 48 g 3  . folic acid (FOLVITE) 1 MG tablet Take 1 tablet (1 mg total) by mouth daily. 90 tablet 3  . gabapentin (NEURONTIN) 300 MG capsule TAKE 1 CAPSULE BY MOUTH THREE TIMES DAILY 90 capsule 11  . hydroxychloroquine (PLAQUENIL) 200 MG tablet Take 1 tablet by mouth 2 (two) times daily.    Marland Kitchen  ibuprofen (ADVIL,MOTRIN) 400 MG tablet Take by mouth.    . methotrexate (RHEUMATREX) 2.5 MG tablet Take 10 tablets by mouth once a week.    . Milk Thistle 250 MG CAPS Take 1 capsule by mouth daily.    . Multiple Vitamin (MULTIVITAMIN) tablet Take 1 tablet by mouth daily.      Marland Kitchen MYRBETRIQ 50 MG TB24 tablet   11  . nystatin (MYCOSTATIN/NYSTOP) powder Apply topically 4 (four) times daily. 15 g 2  . omeprazole (PRILOSEC) 20 MG capsule Take 1 capsule (20 mg total) by mouth daily as needed. 90 capsule 1  . polyethylene glycol (GLYCOLAX) packet Take 34 g by mouth daily.     . pravastatin (PRAVACHOL) 20 MG tablet TAKE 1 TABLET BY MOUTH EVERY DAY 90 tablet 3  . Probiotic Product (ALIGN) 4 MG CAPS Take 1 capsule (4 mg total) by mouth daily. 15 capsule 0  . Selenium 200 MCG CAPS Take 1 capsule by mouth daily.    . Turmeric 500 MG CAPS Take 500 mg by mouth daily.    . vitamin B-12 (CYANOCOBALAMIN) 500 MCG tablet Take 500 mcg by mouth daily.     Current Facility-Administered Medications on File Prior to Visit  Medication Dose Route Frequency Provider Last Rate Last Dose  . omalizumab Arvid Right) injection 375 mg  375 mg Subcutaneous Q14 Days Chesley Mires, MD  375 mg at 06/04/18 0935  . omalizumab Arvid Right) injection 375 mg  375 mg Subcutaneous Q14 Days Chesley Mires, MD   375 mg at 06/21/18 1431    Allergies  Allergen Reactions  . Ciprofloxacin Nausea And Vomiting    Headache  . Citalopram Hydrobromide Other (See Comments)    "unknown"  . Lorazepam     Adverse reaction  . Paroxetine Nausea Only  . Ramipril Other (See Comments)    "unknown"  . Simvastatin   . Sulfa Antibiotics Other (See Comments)    Past Medical History:  Diagnosis Date  . Allergy   . Anemia   . Anxiety   . Arthritis    rheumatoid  . Asthma   . CAD (coronary artery disease)   . Cancer [facility])    Prostate  . Chronic sinusitis   . Depression   . Diverticulitis   . ED (erectile dysfunction)   . GERD (gastroesophageal  reflux disease)   . History of SIADH   . Hx of adenomatous colonic polyps   . Hypertension   . Hyponatremia   . IBS (irritable bowel syndrome)   . Neurodermatitis   . Neurogenic bladder   . OSA (obstructive sleep apnea)     Past Surgical History:  Procedure Laterality Date  . KNEE ARTHROPLASTY Right 09/02/2015   Procedure: COMPUTER ASSISTED TOTAL KNEE ARTHROPLASTY;  Surgeon: [name], MD;  Location: [facility] ORS;  Service: Orthopedics;  Laterality: Right;  . KNEE SURGERY Left    ARTHROSCOPY LEFT  . NASAL SINUS SURGERY  2009   DEVIATED SEPTUM AND POLYPS  . PROSTATE SURGERY     PROSTATECTOMY  . TOTAL KNEE ARTHROPLASTY Left 9/15   Dr [name]  . URETHRAL STRICTURE DILATATION  02-2010   Dr.Cope    Family History  Problem Relation Age of Onset  . Heart disease Mother 64       heart failure  . Pneumonia Father 65       pnemonia  . Colon cancer Neg Hx     Social History   Socioeconomic History  . Marital status: Married    Spouse name: Not on file  . Number of children: 2  . Years of education: Not on file  . Highest education level: Not on file  Occupational History  . 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 Financial resource strain: Not on file  . Food insecurity:    Worry: Not on file    Inability: Not on file  . Transportation needs:    Medical: Not on file    Non-medical: Not on file  Tobacco Use  . Smoking status: Never Smoker  . Smokeless tobacco: Never Used  Substance and Sexual Activity  . Alcohol use: Yes    Alcohol/week: 8.0 standard drinks    Types: 8 Standard drinks or equivalent per week  . Drug use: No  . Sexual activity: Not Currently  Lifestyle  . Physical activity:    Days per week: Not on file    Minutes per session: Not on file  . Stress: Not on file  Relationships  . Social connections:    Talks on phone: Not on file    Gets together: Not on file    Attends religious service: Not on file    Active  member of club or organization: Not on file    Attends meetings of clubs or organizations: Not on file    Relationship status: Not on  file  . Intimate partner violence:    Fear of current or ex partner: Not on file    Emotionally abused: Not on file    Physically abused: Not on file    Forced sexual activity: Not on file  Other Topics Concern  . Not on file  Social History Narrative   Not sure about a living will or health care POA   Wife should make health care decisions for him--then daughter, then son   Would accept resuscitation attempts   Not sure about tube feeds   Review of Systems Just finished with home PT---walking better now Weight stable No edema Constant bladder pain with his indwelling foley Ongoing mood issues---daughter asks that he go back to a counselor again    Objective:   Physical Exam  Constitutional: No distress.  Neck: No thyromegaly present.  Cardiovascular: Normal rate, regular rhythm and normal heart sounds. Exam reveals no gallop.  No murmur heard. Respiratory: Breath sounds normal. No respiratory distress. He has no wheezes. He has no rales.  Musculoskeletal: He exhibits no edema.  Lymphadenopathy:    He has no cervical adenopathy.           Assessment & Plan:

## 2018-07-19 ENCOUNTER — Emergency Department
Admission: EM | Admit: 2018-07-19 | Discharge: 2018-07-19 | Disposition: A | Discharge status: Home / Self Care | Payer: Medicare PPO | Attending: Emergency Medicine | Admitting: Emergency Medicine

## 2018-07-19 ENCOUNTER — Other Ambulatory Visit: Payer: Self-pay

## 2018-07-19 ENCOUNTER — Encounter: Payer: Self-pay | Admitting: Emergency Medicine

## 2018-07-19 ENCOUNTER — Emergency Department: Payer: Medicare PPO

## 2018-07-19 DIAGNOSIS — F419 Anxiety disorder, unspecified: Secondary | ICD-10-CM | POA: Diagnosis not present

## 2018-07-19 DIAGNOSIS — Z888 Allergy status to other drugs, medicaments and biological substances status: Secondary | ICD-10-CM | POA: Diagnosis not present

## 2018-07-19 DIAGNOSIS — E871 Hypo-osmolality and hyponatremia: Secondary | ICD-10-CM | POA: Diagnosis not present

## 2018-07-19 DIAGNOSIS — J45909 Unspecified asthma, uncomplicated: Secondary | ICD-10-CM | POA: Diagnosis not present

## 2018-07-19 DIAGNOSIS — M069 Rheumatoid arthritis, unspecified: Secondary | ICD-10-CM | POA: Diagnosis not present

## 2018-07-19 DIAGNOSIS — N281 Cyst of kidney, acquired: Secondary | ICD-10-CM | POA: Diagnosis not present

## 2018-07-19 DIAGNOSIS — I10 Essential (primary) hypertension: Secondary | ICD-10-CM | POA: Insufficient documentation

## 2018-07-19 DIAGNOSIS — I2510 Atherosclerotic heart disease of native coronary artery without angina pectoris: Secondary | ICD-10-CM | POA: Diagnosis not present

## 2018-07-19 DIAGNOSIS — N2889 Other specified disorders of kidney and ureter: Secondary | ICD-10-CM | POA: Diagnosis not present

## 2018-07-19 DIAGNOSIS — T83091A Other mechanical complication of indwelling urethral catheter, initial encounter: Secondary | ICD-10-CM | POA: Diagnosis not present

## 2018-07-19 DIAGNOSIS — K5900 Constipation, unspecified: Secondary | ICD-10-CM

## 2018-07-19 DIAGNOSIS — Z882 Allergy status to sulfonamides status: Secondary | ICD-10-CM | POA: Diagnosis not present

## 2018-07-19 DIAGNOSIS — Q6100 Congenital renal cyst, unspecified: Secondary | ICD-10-CM | POA: Diagnosis not present

## 2018-07-19 DIAGNOSIS — M1990 Unspecified osteoarthritis, unspecified site: Secondary | ICD-10-CM | POA: Diagnosis not present

## 2018-07-19 DIAGNOSIS — T83021A Displacement of indwelling urethral catheter, initial encounter: Secondary | ICD-10-CM | POA: Diagnosis not present

## 2018-07-19 DIAGNOSIS — E785 Hyperlipidemia, unspecified: Secondary | ICD-10-CM | POA: Diagnosis not present

## 2018-07-19 LAB — COMPREHENSIVE METABOLIC PANEL
ALK PHOS: 54 U/L (ref 38–126)
ALT: 18 U/L (ref 0–44)
AST: 25 U/L (ref 15–41)
Albumin: 3.9 g/dL (ref 3.5–5.0)
Anion gap: 7 (ref 5–15)
BUN: 10 mg/dL (ref 8–23)
CHLORIDE: 93 mmol/L — AB (ref 98–111)
CO2: 27 mmol/L (ref 22–32)
Calcium: 8.7 mg/dL — ABNORMAL LOW (ref 8.9–10.3)
Creatinine, Ser: 0.52 mg/dL — ABNORMAL LOW (ref 0.61–1.24)
GFR calc non Af Amer: >60 mL/min (ref >60)
Glucose, Bld: 106 mg/dL — ABNORMAL HIGH (ref 70–99)
POTASSIUM: 4.2 mmol/L (ref 3.5–5.1)
SODIUM: 127 mmol/L — AB (ref 135–145)
TOTAL PROTEIN: 6.3 g/dL — AB (ref 6.5–8.1)
Total Bilirubin: 0.8 mg/dL (ref 0.3–1.2)

## 2018-07-19 LAB — URINALYSIS, COMPLETE (UACMP) WITH MICROSCOPIC
BACTERIA UA: NONE SEEN
BILIRUBIN URINE: NEGATIVE
GLUCOSE, UA: NEGATIVE mg/dL
Ketones, ur: NEGATIVE mg/dL
Leukocytes, UA: NEGATIVE
NITRITE: NEGATIVE
PH: 8 (ref 5.0–8.0)
PROTEIN: NEGATIVE mg/dL
Specific Gravity, Urine: 1.001 — ABNORMAL LOW (ref 1.005–1.030)
Squamous Epithelial / LPF: NONE SEEN (ref 0–5)
WBC, UA: NONE SEEN WBC/hpf (ref 0–5)

## 2018-07-19 LAB — CBC
HEMATOCRIT: 36 % — AB (ref 40.0–52.0)
Hemoglobin: 12.8 g/dL — ABNORMAL LOW (ref 13.0–18.0)
MCH: 33.2 pg (ref 26.0–34.0)
MCHC: 35.7 g/dL (ref 32.0–36.0)
MCV: 93.1 fL (ref 80.0–100.0)
PLATELETS: 162 10*3/uL (ref 150–440)
RBC: 3.86 MIL/uL — AB (ref 4.40–5.90)
RDW: 16.5 % — AB (ref 11.5–14.5)
WBC: 4.8 10*3/uL (ref 3.8–10.6)

## 2018-07-19 LAB — LIPASE, BLOOD: Lipase: 30 U/L (ref 11–51)

## 2018-07-19 MED ORDER — IOPAMIDOL (ISOVUE-300) INJECTION 61%
100.0000 mL | Freq: Once | INTRAVENOUS | Status: AC | PRN
  Administered 2018-07-19: 100 mL via INTRAVENOUS
  Filled 2018-07-19: qty 100

## 2018-07-19 MED ORDER — LACTULOSE 10 GM/15ML PO SOLN
30.0000 g | Freq: Once | ORAL | Status: AC
  Administered 2018-07-19: 30 g via ORAL
  Filled 2018-07-19 (×2): qty 60

## 2018-07-19 MED ORDER — SODIUM CHLORIDE 0.9 % IV BOLUS
500.0000 mL | Freq: Once | INTRAVENOUS | Status: AC
  Administered 2018-07-19: 500 mL via INTRAVENOUS

## 2018-07-19 MED ORDER — LACTULOSE 10 GM/15ML PO SOLN: 30.0000 g | Freq: Every day | ORAL | 0 refills | Status: DC | PRN

## 2018-07-19 NOTE — ED Notes (Signed)
Patient transported to CT 

## 2018-07-19 NOTE — Discharge Instructions (Addendum)
Today your CT scan showed a cyst on your right kidney AND a mass.  The mass will need further evaluation by Dr. Tasia Catchings; her office will call you to schedule an appointment tomorrow.  For your constipation, please continue your MiraLAX, high-fiber diet, plenty of exercise, and fluids within the constraints of your fluid restriction.  Please schedule an appoint with your primary care physician in 5 days for reevaluation of your constipation, as well as recheck of your sodium level.  Return to the emergency department if you develop severe pain, lightheadedness or fainting, fever, or any other symptoms concerning to you.

## 2018-07-19 NOTE — ED Triage Notes (Signed)
Pt comes into the ED via POV c/o constipation for 4 days.  Patient states that when he eats, he gets a "full feeling" really fast.  Patient called his [employer] nurse who told him to come in.   Patient states he has been taking miralax every day and sometimes twice a day with no relief.  Patient has indwelling catheter at this time as well and has extensive medical history with known infections.  Patient describes most of his pain in the left lower side of his abdomen.  Patient states he was able to have a small bowel movement this morning but he is still having the abdominal cramping.

## 2018-07-19 NOTE — ED Provider Notes (Signed)
[facility] Emergency Department Provider Note  ____________________________________________  Time seen: Approximately 4:14 PM  I have reviewed the triage vital signs and the nursing notes.   HISTORY  Chief Complaint Constipation and Abdominal Pain    HPI [name] is a 84 y.o. male a long history of chronic constipation, prostate cancer status post indwelling Foley x10 years, presenting for constipation and left lower quadrant pain.  The patient reports that for the past 4 to 5 days, he has been having difficulty with bowel movements despite his chronic MiraLAX use.  He states that at baseline, he has a bowel movement every 1 to 2 days which requires a significant amount of straining.  He has a long history of hyponatremia and is fluid restricted for this, which he attributes to some of his difficulties.  Last night, the patient did have a small to moderate sized stool and he did again stool today, and he has been passing "lots of gas."  He has not had any nausea or vomiting, fevers or chills.  However, he does note some intermittent sharp left lower quadrant pain which feels similar to his prior diverticulitis.  Past Medical History:  Diagnosis Date  . Allergy   . Anemia   . Anxiety   . Arthritis    rheumatoid  . Asthma   . CAD (coronary artery disease)   . Cancer [facility])    Prostate  . Chronic sinusitis   . Depression   . Diverticulitis   . ED (erectile dysfunction)   . GERD (gastroesophageal reflux disease)   . History of SIADH   . Hx of adenomatous colonic polyps   . Hypertension   . Hyponatremia   . IBS (irritable bowel syndrome)   . Neurodermatitis   . Neurogenic bladder   . OSA (obstructive sleep apnea)     Patient Active Problem List   Diagnosis Date Noted  . TIA (transient ischemic attack) 06/06/2018  . Carotid artery disease ([place]) 06/06/2018  . Expressive aphasia 05/25/2018  . Complicated UTI (urinary tract infection) 10/18/2017  .  SIADH (syndrome of inappropriate ADH production) ([place]) 04/17/2017  . Hyponatremia 01/16/2017  . Asthma with acute exacerbation 11/14/2016  . Lobar pneumonia ([place]) 11/14/2016  . Mood disorder ([place]) 01/18/2016  . Presence of indwelling urinary catheter 08/20/2015  . Advanced directives, counseling/discussion 07/28/2014  . Routine general medical examination at a health care facility 04/09/2012  . History of prostate cancer 04/09/2012  . Venous stasis dermatitis 06/13/2011  . Chronic sinusitis   . CONSTIPATION, CHRONIC 12/07/2010  . Hyperlipemia 12/02/2010  . OSTEOARTHRITIS 12/02/2010  . NEUROGENIC BLADDER 05/24/2010  . IBS 05/05/2010  . PERSONAL HISTORY OF COLONIC POLYPS 05/05/2010  . NEUROPATHY 05/03/2010  . Essential hypertension, benign 03/25/2009  . GAD (generalized anxiety disorder) 01/27/2007  . Coronary atherosclerosis of native coronary artery 01/27/2007  . Allergic asthma 01/27/2007  . GERD 01/27/2007  . Chronic rheumatic arthritis ([place]) 01/27/2007    Past Surgical History:  Procedure Laterality Date  . KNEE ARTHROPLASTY Right 09/02/2015   Procedure: COMPUTER ASSISTED TOTAL KNEE ARTHROPLASTY;  Surgeon: [name], MD;  Location: [facility] ORS;  Service: Orthopedics;  Laterality: Right;  . KNEE SURGERY Left    ARTHROSCOPY LEFT  . NASAL SINUS SURGERY  2009   DEVIATED SEPTUM AND POLYPS  . PROSTATE SURGERY     PROSTATECTOMY  . TOTAL KNEE ARTHROPLASTY Left 9/15   Dr [name]  . URETHRAL STRICTURE DILATATION  02-2010   Dr.Cope  Current Outpatient Rx  . Order #: [id number] Class: Normal  . Order #: [id number] Class: Normal  . Order #: [id number] Class: Historical Med  . Order #: [id number] Class: Historical Med  . Order #: [id number] Class: Historical Med  . Order #: [id number] Class: Historical Med  . Order #: [id number] Class: Normal  . Order #: [id number] Class: Normal  . Order #: [id number] Class: Normal  . Order #: [id number] Class: Historical Med  . Order #: [id number] Class:  Historical Med  . Order #: [id number] Class: Normal  . Order #: [id number] Class: Historical Med  . Order #: [id number] Class: Historical Med  . Order #: [id number] Class: Historical Med  . Order #: [id number] Class: Historical Med  . Order #: [id number] Class: Normal  . Order #: [id number] Class: Normal  . Order #: [id number] Class: Historical Med  . Order #: [id number] Class: Normal  . Order #: [id number] Class: Print  . Order #: [id number] Class: Historical Med  . Order #: [id number] Class: Historical Med  . Order #: [id number] Class: Historical Med    Allergies Ciprofloxacin; Citalopram hydrobromide; Lorazepam; Paroxetine; Ramipril; Simvastatin; and Sulfa antibiotics  Family History  Problem Relation Age of Onset  . Heart disease Mother 58       heart failure  . Pneumonia Father 60       pnemonia  . Colon cancer Neg Hx     Social History Social History   Tobacco Use  . Smoking status: Never Smoker  . Smokeless tobacco: Never Used  Substance Use Topics  . Alcohol use: Yes    Alcohol/week: 8.0 standard drinks    Types: 8 Standard drinks or equivalent per week  . Drug use: No    Review of Systems Constitutional: No fever/chills.  No lightheadedness or syncope. Eyes: No visual changes. ENT: No sore throat. No congestion or rhinorrhea. Cardiovascular: Denies chest pain. Denies palpitations. Respiratory: Denies shortness of breath.  No cough. Gastrointestinal: Positive left lower quadrant abdominal pain.  No nausea, no vomiting.  No diarrhea.  Positive constipation.  + flatus Genitourinary: No change in urinary output. Musculoskeletal: Negative for back pain. Skin: Negative for rash. Neurological: Negative for headaches. No focal numbness, tingling or weakness.     ____________________________________________   PHYSICAL EXAM:  VITAL SIGNS: ED Triage Vitals  Enc Vitals Group     BP 07/19/18 1346 (!) 127/97     Pulse Rate 07/19/18 1346 77     Resp 07/19/18 1346 18     Temp 07/19/18  1346 98 F (36.7 C)     Temp Source 07/19/18 1346 Oral     SpO2 07/19/18 1346 97 %     Weight 07/19/18 1343 199 lb 1.2 oz (90.3 kg)     Height 07/19/18 1343 5\' 10"  (1.778 m)     Head Circumference --      Peak Flow --      Pain Score 07/19/18 1343 5     Pain Loc --      Pain Edu? --      Excl. in [place]? --     Constitutional: Alert and oriented. Answers questions appropriately. Eyes: Conjunctivae are normal.  EOMI. No scleral icterus. Head: Atraumatic. Nose: No congestion/rhinnorhea. Mouth/Throat: Mucous membranes are moist.  Neck: No stridor.  Supple.  No JVD.  No meningismus. Cardiovascular: Normal rate, regular rhythm. No murmurs, rubs or gallops.  Respiratory: Normal respiratory effort.  No accessory muscle use or retractions. Lungs CTAB.  No wheezes, rales or ronchi. Gastrointestinal: Soft, nontender and nondistended.  I am unable to reproduce any tenderness on my examination.  No guarding or rebound.  No peritoneal signs. Genitourinary: Indwelling Foley catheter. Musculoskeletal: No LE edema. Neurologic:  A&Ox3.  Speech is clear.  Face and smile are symmetric.  EOMI.  Moves all extremities well. Skin:  Skin is warm, dry and intact. No rash noted. Psychiatric: Mood and affect are normal. Speech and behavior are normal.  Normal judgement.  ____________________________________________   LABS (all labs ordered are listed, but only abnormal results are displayed)  Labs Reviewed  COMPREHENSIVE METABOLIC PANEL - Abnormal; Notable for the following components:      Result Value   Sodium 127 (*)    Chloride 93 (*)    Glucose, Bld 106 (*)    Creatinine, Ser 0.52 (*)    Calcium 8.7 (*)    Total Protein 6.3 (*)    All other components within normal limits  CBC - Abnormal; Notable for the following components:   RBC 3.86 (*)    Hemoglobin 12.8 (*)    HCT 36.0 (*)    RDW 16.5 (*)    All other components within normal limits  URINALYSIS, COMPLETE (UACMP) WITH MICROSCOPIC -  Abnormal; Notable for the following components:   Color, Urine YELLOW (*)    APPearance CLOUDY (*)    Specific Gravity, Urine 1.001 (*)    Hgb urine dipstick MODERATE (*)    All other components within normal limits  LIPASE, BLOOD   ____________________________________________  EKG  Not indicated ____________________________________________  RADIOLOGY  Ct Abdomen Pelvis W Contrast  Result Date: 07/19/2018 CLINICAL DATA:  Constipation 4 days with left lower quadrant pain. EXAM: CT ABDOMEN AND PELVIS WITH CONTRAST TECHNIQUE: Multidetector CT imaging of the abdomen and pelvis was performed using the standard protocol following bolus administration of intravenous contrast. CONTRAST:  136mL ISOVUE-300 IOPAMIDOL (ISOVUE-300) INJECTION 61% COMPARISON:  12/03/2010 FINDINGS: Lower chest: Mild left basilar atelectasis. Calcification of the mitral valve annulus. Mild calcified plaque over the lateral circumflex and right coronary arteries. Hepatobiliary: Liver demonstrates a subcentimeter hypodensity over the left lobe unchanged likely a cyst. Gallbladder and biliary tree are normal. Pancreas: Normal. Spleen: Normal. Adrenals/Urinary Tract: Adrenal glands are normal. Kidneys normal size and demonstrate a 1.3 cm cyst over the mid pole right kidney. There is a 3.1 cm enhancing mass over the lower pole right kidney. No hydronephrosis or nephrolithiasis. Ureters and bladder are within normal. Foley catheter is present within the decompressed bladder. Stomach/Bowel: Stomach and small bowel are normal. Appendix is normal. There is colonic diverticulosis most prominent over the distal descending and sigmoid colon. Vascular/Lymphatic: Moderate calcified plaque over the abdominal aorta. No adenopathy. Reproductive: Previous prostatectomy. Other: No free fluid or focal inflammatory change. Musculoskeletal: Moderate degenerate changes spine and mild degenerate change of the hips. Multilevel disc disease over the lumbar  spine. IMPRESSION: No acute findings in the abdomen/pelvis. Nonobstructive bowel gas pattern. 3.1 cm enhancing mass over the lower pole right kidney likely renal cell carcinoma. No adenopathy. 1.3 cm right renal cyst. Subcentimeter left liver cyst. Colonic diverticulosis. Aortic Atherosclerosis (ICD10-I70.0). Atherosclerotic coronary artery disease. Left basilar atelectasis. Electronically Signed   By: Marin Olp M.D.   On: 07/19/2018 16:55    ____________________________________________   PROCEDURES  Procedure(s) performed: None  Procedures  Critical Care performed: No ____________________________________________   INITIAL IMPRESSION / ASSESSMENT AND PLAN / ED COURSE  Pertinent labs & imaging results that were available during my care of the patient were reviewed by me and considered in my medical decision making (see chart for details).  84 y.o. L with history of chronic  constipation, prostate CA status post indwelling Foley, presenting for 4 to 5 days of constipation with intermittent left lower quadrant pain.  Overall, the patient is hemodynamically stable and afebrile.  On my examination his no abnormal findings on his abdominal exam; there is no evidence for any pain.  The most likely etiology of the patient's symptoms is constipation.  However, the patient is concerned that he has had diverticulitis in the past.  We have discussed the risks and benefits of CT imaging and I have recommended constipation treatment with follow-up and CT imaging as needed if the patient does not have any improvement.  However, he states "I have been a hypochondriac since I was 7 and I want the CT."  Patient's laboratory studies show normal white blood cell count.  A chronic mild anemia with hemoglobin of 12.8.  Does have a hyponatremia of 127; however, the patient has chronic hyponatremia and has had this for years; his sodium baseline is between 125 and 133 so this hyponatremia is within the patient's  normal baseline; he has no signs or symptoms of hyponatremia including confusion or seizures.  The patient will receive lactulose for constipation treatment and undergo CT imaging; reevaluation for final disposition.  Clinical course:  The patient did not develop any further pain in the emergency department and remained hemodynamically stable.  His CT scan did not show any diverticulitis.  However, there was a renal cyst on the right, as well as a 3.1 cm mass over the lower pole of the right kidney.  I have spoken with Dr. Tasia Catchings, the oncologist on-call, who will call the patient to schedule an appointment tomorrow.  I have discussed the findings as well as the follow-up plan with the patient and his daughter who demonstrated understanding.  At this time, the patient is safe for discharge home.  He will follow-up with his primary care physician for his hyponatremia and the oncologist for his renal mass.  Return precautions were discussed.  ____________________________________________  FINAL CLINICAL IMPRESSION(S) / ED DIAGNOSES  Final diagnoses:  Constipation, unspecified constipation type  Renal cyst, right  Right renal mass  Hyponatremia         NEW MEDICATIONS STARTED DURING THIS VISIT:  Discharge Medication List as of 07/19/2018  6:49 PM    START taking these medications   Details  lactulose (CHRONULAC) 10 GM/15ML solution Take 45 mLs (30 g total) by mouth daily as needed for severe constipation., Starting Thu 07/19/2018, Normal      Eula Listen, MD 07/19/18 2144

## 2018-07-20 ENCOUNTER — Other Ambulatory Visit: Payer: Self-pay

## 2018-07-20 ENCOUNTER — Inpatient Hospital Stay: Payer: Medicare PPO | Attending: Oncology | Admitting: Oncology

## 2018-07-20 ENCOUNTER — Encounter: Payer: Self-pay | Admitting: Oncology

## 2018-07-20 VITALS — BP 132/82 | HR 84 | Temp 97.6°F | Resp 18 | Wt 196.0 lb

## 2018-07-20 DIAGNOSIS — Z960 Presence of urogenital implants: Secondary | ICD-10-CM | POA: Insufficient documentation

## 2018-07-20 DIAGNOSIS — K589 Irritable bowel syndrome without diarrhea: Secondary | ICD-10-CM | POA: Insufficient documentation

## 2018-07-20 DIAGNOSIS — Z79899 Other long term (current) drug therapy: Secondary | ICD-10-CM | POA: Insufficient documentation

## 2018-07-20 DIAGNOSIS — G4733 Obstructive sleep apnea (adult) (pediatric): Secondary | ICD-10-CM | POA: Insufficient documentation

## 2018-07-20 DIAGNOSIS — I2510 Atherosclerotic heart disease of native coronary artery without angina pectoris: Secondary | ICD-10-CM | POA: Diagnosis not present

## 2018-07-20 DIAGNOSIS — Z923 Personal history of irradiation: Secondary | ICD-10-CM | POA: Insufficient documentation

## 2018-07-20 DIAGNOSIS — K219 Gastro-esophageal reflux disease without esophagitis: Secondary | ICD-10-CM | POA: Insufficient documentation

## 2018-07-20 DIAGNOSIS — F419 Anxiety disorder, unspecified: Secondary | ICD-10-CM | POA: Insufficient documentation

## 2018-07-20 DIAGNOSIS — Z9079 Acquired absence of other genital organ(s): Secondary | ICD-10-CM | POA: Diagnosis not present

## 2018-07-20 DIAGNOSIS — N2889 Other specified disorders of kidney and ureter: Secondary | ICD-10-CM | POA: Diagnosis not present

## 2018-07-20 DIAGNOSIS — J45909 Unspecified asthma, uncomplicated: Secondary | ICD-10-CM | POA: Insufficient documentation

## 2018-07-20 DIAGNOSIS — M1990 Unspecified osteoarthritis, unspecified site: Secondary | ICD-10-CM | POA: Diagnosis not present

## 2018-07-20 DIAGNOSIS — Z978 Presence of other specified devices: Secondary | ICD-10-CM

## 2018-07-20 DIAGNOSIS — Z8546 Personal history of malignant neoplasm of prostate: Secondary | ICD-10-CM | POA: Insufficient documentation

## 2018-07-20 DIAGNOSIS — F329 Major depressive disorder, single episode, unspecified: Secondary | ICD-10-CM | POA: Diagnosis not present

## 2018-07-20 DIAGNOSIS — I10 Essential (primary) hypertension: Secondary | ICD-10-CM | POA: Insufficient documentation

## 2018-07-20 NOTE — Progress Notes (Signed)
Patient here for initial visit. He has permanent catheter in place.

## 2018-07-20 NOTE — Progress Notes (Signed)
Hematology/Oncology Consult note [facility] Telephone:([phone] [phone]   Patient Care Team: [name], MD as PCP - General  REFERRING PROVIDER: ER physician Dr. Mariea Clonts CHIEF COMPLAINTS/REASON FOR VISIT:  Evaluation of kidney mass  HISTORY OF PRESENTING ILLNESS:  [name] is a  84 y.o.  male with PMH listed below who was referred to me for evaluation of kidney mass.  Patient has a remote history of prostate cancer diagnosed, status post radical prostatectomy 1993 status post salvage radiation in 1998, chronic history of urinary retention, with chronic indwelling Foley catheter.  He used to follow-up with Dr. [name] and recently establish care with Dr. Bernardo Heater and was last seen this month. Patient presented to emergency room for evaluation of constipation.  CT abdomen pelvis was done and showed incidental finding of 3.1 cm enhancing mass over the lower pole of right kidney. UA showed 0-5 RBC per high-power field. Patient was referred from the emergency room to establish care. Patient appears very anxious about CT findings.  He was accompanied by his daughter. Chronic history of hyponatremia, asymptomatic. Patient says" I have been hypochondria since I was 7, and I want everything to be done" Reports weight loss for about a few pounds.  Daughter feels that patient be eating well and weight difference can be secondary to that he had a good bowel movement yesterday. Denies any night sweating, fever or chills. Review of Systems  Constitutional: Negative for chills, fever, malaise/fatigue and weight loss.  HENT: Negative for nosebleeds and sore throat.   Eyes: Negative for double vision, photophobia and redness.  Respiratory: Negative for cough, shortness of breath and wheezing.   Cardiovascular: Negative for chest pain, palpitations and orthopnea.  Gastrointestinal: Negative for abdominal pain, blood in stool, nausea and vomiting.    Genitourinary: Negative for dysuria.  Musculoskeletal: Negative for back pain, myalgias and neck pain.  Skin: Negative for itching and rash.  Neurological: Negative for dizziness, tingling and tremors.  Endo/Heme/Allergies: Negative for environmental allergies. Does not bruise/bleed easily.  Psychiatric/Behavioral: Negative for depression.    MEDICAL HISTORY:  Past Medical History:  Diagnosis Date  . Allergy   . Anemia   . Anxiety   . Arthritis    rheumatoid  . Asthma   . CAD (coronary artery disease)   . [facility])    Prostate  . Chronic sinusitis   . Depression   . Diverticulitis   . ED (erectile dysfunction)   . GERD (gastroesophageal reflux disease)   . History of SIADH   . Hx of adenomatous colonic polyps   . Hypertension   . Hyponatremia   . IBS (irritable bowel syndrome)   . Neurodermatitis   . Neurogenic bladder   . OSA (obstructive sleep apnea)     SURGICAL HISTORY: Past Surgical History:  Procedure Laterality Date  . KNEE ARTHROPLASTY Right 09/02/2015   Procedure: COMPUTER ASSISTED TOTAL KNEE ARTHROPLASTY;  Surgeon: [name], MD;  Location: [facility] ORS;  Service: Orthopedics;  Laterality: Right;  . KNEE SURGERY Left    ARTHROSCOPY LEFT  . NASAL SINUS SURGERY  2009   DEVIATED SEPTUM AND POLYPS  . permanent indwelling catheter    . PROSTATE SURGERY     PROSTATECTOMY  . TOTAL KNEE ARTHROPLASTY Left 9/15   Dr [name]  . URETHRAL STRICTURE DILATATION  02-2010   Dr.Cope    SOCIAL HISTORY: Social History   Socioeconomic History  . Marital status: Married    Spouse name: Not on  file  . Number of children: 2  . Years of education: Not on file  . Highest education level: Not on file  Occupational History  . 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 Financial resource strain: Not on file  . Food insecurity:    Worry: Not on file    Inability: Not on file  . Transportation needs:    Medical: Not on file     Non-medical: Not on file  Tobacco Use  . Smoking status: Never Smoker  . Smokeless tobacco: Never Used  Substance and Sexual Activity  . Alcohol use: Yes    Comment: 5 days out of 7 drinks gin or wine interchangeably   . Drug use: No  . Sexual activity: Not Currently  Lifestyle  . Physical activity:    Days per week: Not on file    Minutes per session: Not on file  . Stress: Not on file  Relationships  . Social connections:    Talks on phone: Not on file    Gets together: Not on file    Attends religious service: Not on file    Active member of club or organization: Not on file    Attends meetings of clubs or organizations: Not on file    Relationship status: Not on file  . Intimate partner violence:    Fear of current or ex partner: Not on file    Emotionally abused: Not on file    Physically abused: Not on file    Forced sexual activity: Not on file  Other Topics Concern  . Not on file  Social History Narrative   Not sure about a living will or health care POA   Wife should make health care decisions for him--then daughter, then son   Would accept resuscitation attempts   Not sure about tube feeds    FAMILY HISTORY: Family History  Problem Relation Age of Onset  . Heart disease Mother 43       heart failure  . Pneumonia Father 25       pnemonia  . Skin cancer Father   . Skin cancer Sister   . Skin cancer Son   . Colon cancer Neg Hx     ALLERGIES:  is allergic to ciprofloxacin; citalopram hydrobromide; lorazepam; paroxetine; ramipril; simvastatin; and sulfa antibiotics.  MEDICATIONS:  Current Outpatient Medications  Medication Sig Dispense Refill  . albuterol (PROVENTIL HFA;VENTOLIN HFA) 108 (90 Base) MCG/ACT inhaler Inhale 2 puffs into the lungs every 6 (six) hours as needed for wheezing or shortness of breath. 1 Inhaler 3  . ALPRAZolam (XANAX) 0.5 MG tablet TAKE 1 TABLET BY MOUTH THREE TIMES DAILY AS NEEDED FOR ANXIETY OR SLEEP 90 tablet 0  . Cholecalciferol  (VITAMIN D3) 5000 UNITS CAPS Take 1 capsule by mouth daily.    . clopidogrel (PLAVIX) 75 MG tablet   4  . Coenzyme Q10 (CO Q 10) 100 MG CAPS Take 1 capsule by mouth daily.    . Cranberry 425 MG CAPS Take 2 capsules by mouth daily.    . fluticasone (FLONASE) 50 MCG/ACT nasal spray Place 2 sprays into both nostrils 2 (two) times daily. 48 g 3  . folic acid (FOLVITE) 1 MG tablet Take 1 tablet (1 mg total) by mouth daily. 90 tablet 3  . gabapentin (NEURONTIN) 300 MG capsule TAKE 1 CAPSULE BY MOUTH THREE TIMES DAILY 90 capsule 11  . hydroxychloroquine (PLAQUENIL) 200 MG tablet Take 1 tablet  by mouth 2 (two) times daily.    Marland Kitchen ibuprofen (ADVIL,MOTRIN) 400 MG tablet Take by mouth.    . lactulose (CHRONULAC) 10 GM/15ML solution Take 45 mLs (30 g total) by mouth daily as needed for severe constipation. 135 mL 0  . methotrexate (RHEUMATREX) 2.5 MG tablet Take 10 tablets by mouth once a week.    . Milk Thistle 250 MG CAPS Take 1 capsule by mouth daily.    . Multiple Vitamin (MULTIVITAMIN) tablet Take 1 tablet by mouth daily.      Marland Kitchen MYRBETRIQ 50 MG TB24 tablet   11  . nystatin (MYCOSTATIN/NYSTOP) powder Apply topically 4 (four) times daily. 15 g 2  . omeprazole (PRILOSEC) 20 MG capsule Take 1 capsule (20 mg total) by mouth daily as needed. 90 capsule 1  . polyethylene glycol (GLYCOLAX) packet Take 34 g by mouth daily.     . pravastatin (PRAVACHOL) 20 MG tablet TAKE 1 TABLET BY MOUTH EVERY DAY 90 tablet 3  . Probiotic Product (ALIGN) 4 MG CAPS Take 1 capsule (4 mg total) by mouth daily. 15 capsule 0  . Selenium 200 MCG CAPS Take 1 capsule by mouth daily.    . Turmeric 500 MG CAPS Take 500 mg by mouth daily.    . vitamin B-12 (CYANOCOBALAMIN) 500 MCG tablet Take 500 mcg by mouth daily.     Current Facility-Administered Medications  Medication Dose Route Frequency Provider Last Rate Last Dose  . omalizumab Arvid Right) injection 375 mg  375 mg Subcutaneous Q14 Days Chesley Mires, MD   375 mg at 06/04/18 0935  .  omalizumab Arvid Right) injection 375 mg  375 mg Subcutaneous Q14 Days Chesley Mires, MD   375 mg at 06/21/18 1431     PHYSICAL EXAMINATION: ECOG PERFORMANCE STATUS: 1 - Symptomatic but completely ambulatory Vitals:   07/20/18 1338  BP: 132/82  Pulse: 84  Resp: 18  Temp: 97.6 F (36.4 C)   Filed Weights   07/20/18 1338  Weight: 196 lb (88.9 kg)    Physical Exam  Constitutional: He is oriented to person, place, and time. No distress.  Frail appearance elderly male  HENT:  Head: Normocephalic and atraumatic.  Mouth/Throat: Oropharynx is clear and moist.  Eyes: Pupils are equal, round, and reactive to light. EOM are normal. No scleral icterus.  Neck: Normal range of motion. Neck supple.  Cardiovascular: Normal rate, regular rhythm and normal heart sounds.  Pulmonary/Chest: Effort normal. No respiratory distress.  Abdominal: Soft. Bowel sounds are normal. He exhibits no distension. There is no tenderness.  Chronic indwelling Foley catheter.  Musculoskeletal: Normal range of motion. He exhibits no edema or deformity.  Neurological: He is alert and oriented to person, place, and time. No cranial nerve deficit. Coordination normal.  Skin: Skin is warm and dry. No rash noted. No erythema.  Psychiatric:  Extremely anxious     LABORATORY DATA:  I have reviewed the data as listed Lab Results  Component Value Date   WBC 4.8 07/19/2018   HGB 12.8 (L) 07/19/2018   HCT 36.0 (L) 07/19/2018   MCV 93.1 07/19/2018   PLT 162 07/19/2018   Recent Labs    05/25/18 1953 05/30/18 0409 07/13/18 1307 07/19/18 1345  NA 132* 128* 133* 127*  K 3.9 4.1 5.0 4.2  CL 96* 94* 96 93*  CO2 29 24 34* 27  GLUCOSE 124* 114* 100* 106*  BUN 13 13 11 10   CREATININE 0.67 0.56* 0.66 0.52*  CALCIUM 8.8* 8.6* 9.3 8.7*  GFRNONAA >  60 >60  --  >60  GFRAA >60 >60  --  >60  PROT 6.8 6.7  --  6.3*  ALBUMIN 3.7 3.8 3.9 3.9  AST 25 24  --  25  ALT 14 13  --  18  ALKPHOS 52 59  --  54  BILITOT 0.8 1.1  --   0.8   Iron/TIBC/Ferritin/ %Sat No results found for: IRON, TIBC, FERRITIN, IRONPCTSAT    RADIOGRAPHIC STUDIES: I have personally reviewed the radiological images as listed and agreed with the findings in the report. CT abdomen pelvis w contrast No acute findings in the abdomen/pelvis. Nonobstructive bowel gas Pattern. 3.1 cm enhancing mass over the lower pole right kidney likely renal cell carcinoma. No adenopathy. 1.3 cm right renal cyst. Subcentimeter left liver cyst. Colonic diverticulosis. Aortic Atherosclerosis (ICD10-I70.0). Atherosclerotic coronary artery disease.  DG chest 1 view: Low lung volumes with hazy streaky bibasilar lung opacities similar to prior.  Favor scarring or atelectasis.  Left basilar atelectasis. ASSESSMENT & PLAN:  1. Kidney mass   2. History of prostate cancer   3. Chronic indwelling Foley catheter    CT image was reviewed independently and discussed with patient and his daughter.  Small right kidney mass, no lymphadenopathy. Likely early stage kidney cancer.  Recommend follow-up with Dr. Bernardo Heater for surgical evaluation/biopsy Patient has lot of questions and I answered to his satisfaction. All questions were answered. The patient knows to call the clinic with any problems questions or concerns.  Return of visit: To be determined. Thank you for this kind referral and the opportunity to participate in the care of this patient. A copy of today's note is routed to referring provider  Total face to face encounter time for this patient visit was 45 min. >50% of the time was  spent in counseling and coordination of care.    Earlie Server, MD, PhD Hematology Oncology [facility] at [facility] Pager- [phone] 07/20/2018

## 2018-07-23 ENCOUNTER — Telehealth: Payer: Self-pay | Admitting: Urology

## 2018-07-23 ENCOUNTER — Ambulatory Visit: Payer: Self-pay

## 2018-07-23 ENCOUNTER — Ambulatory Visit: Payer: Medicare PPO

## 2018-07-23 NOTE — Telephone Encounter (Signed)
-----   Message from Abbie Sons, MD sent at 07/23/2018  7:12 AM EDT ----- Pls sched appt with Erlene Quan or [facility] for 3.1 cm enhancing renal mass- routine 1st available

## 2018-07-23 NOTE — Telephone Encounter (Signed)
App has already been made for 07-24-18 with Sinclair Grooms

## 2018-07-23 NOTE — Telephone Encounter (Signed)
Pt has upcoming appt 8/28 @ 1215

## 2018-07-23 NOTE — Telephone Encounter (Signed)
Okay Will recheck labs, discussed the renal mass and review his bowel issues

## 2018-07-23 NOTE — Telephone Encounter (Signed)
Incoming call form patient  Who states last week he had trouble having a BM.  Was seen in ER.  Had a CAT scan. Was provided Lactulose.  Had one loose diarrhea stool.  Has had trouble sleeping.  Has not had a BM for 4 days.  Also states that he has hyponatremia. States his blood work reveals 133, then decreased to 127.  Patient voices concern and would like Dr.  Silvio Pate to recheck values.  States the pain is on his left side, below his belly. Also states there is a right mas on the right kidney.    Same pain as before. States he has inflamed diverticula. Reviewed care advice.  Patient scheduled for  07/25/18 at 12:15pm Patient voiced understanding.    Reason for Disposition . Abdominal pain is a chronic symptom (recurrent or ongoing AND present > 4 weeks)  Answer Assessment - Initial Assessment Questions 1. LOCATION: "Where does it hurt?"      See notes 2. RADIATION: "Does the pain shoot anywhere else?" (e.g., chest, back)      no 3. ONSET: "When did the pain begin?" (Minutes, hours or days ago)      *No Answer* 4. SUDDEN: "Gradual or sudden onset?"     4days 5. PATTERN "Does the pain come and go, or is it constant?"    - If constant: "Is it getting better, staying the same, or worsening?"      (Note: Constant means the pain never goes away completely; most serious pain is constant and it progresses)     - If intermittent: "How long does it last?" "Do you have pain now?"     (Note: Intermittent means the pain goes away completely between bouts)     constant 6. SEVERITY: "How bad is the pain?"  (e.g., Scale 1-10; mild, moderate, or severe)    - MILD (1-3): doesn't interfere with normal activities, abdomen soft and not tender to touch     - MODERATE (4-7): interferes with normal activities or awakens from sleep, tender to touch     - SEVERE (8-10): excruciating pain, doubled over, unable to do any normal activities       severe 7. RECURRENT SYMPTOM: "Have you ever had this type of abdominal pain  before?" If so, ask: "When was the last time?" and "What happened that time?"      Yes with bowel problems 8. CAUSE: "What do you think is causing the abdominal pain?"     See note 9. RELIEVING/AGGRAVATING FACTORS: "What makes it better or worse?" (e.g., movement, antacids, bowel movement)     Rubbing via heating help temporalily", helps with passing gas very temp 10. OTHER SYMPTOMS: "Has there been any vomiting, diarrhea, constipation, or urine problems?"       constipated  Protocols used: ABDOMINAL PAIN - MALE-A-AH

## 2018-07-24 ENCOUNTER — Ambulatory Visit (INDEPENDENT_AMBULATORY_CARE_PROVIDER_SITE_OTHER): Payer: Medicare PPO | Admitting: Urology

## 2018-07-24 ENCOUNTER — Encounter: Payer: Self-pay | Admitting: Urology

## 2018-07-24 ENCOUNTER — Telehealth: Payer: Self-pay | Admitting: Pulmonary Disease

## 2018-07-24 VITALS — BP 145/75 | HR 89

## 2018-07-24 DIAGNOSIS — N2889 Other specified disorders of kidney and ureter: Secondary | ICD-10-CM

## 2018-07-24 DIAGNOSIS — T839XXD Unspecified complication of genitourinary prosthetic device, implant and graft, subsequent encounter: Secondary | ICD-10-CM

## 2018-07-24 DIAGNOSIS — N319 Neuromuscular dysfunction of bladder, unspecified: Secondary | ICD-10-CM | POA: Diagnosis not present

## 2018-07-24 NOTE — Telephone Encounter (Signed)
Prefilled Syringe: #150mg  4  #75mg  2 Ordered Date: 07/24/18 Shipping Date: 07/24/18

## 2018-07-24 NOTE — Progress Notes (Signed)
07/24/2018 2:36 PM   [name] 1934/08/12 [id number]  Referring provider: [name], MD [address] [place], [place] [address]  Chief Complaint  Patient presents with  . Renal Mass    discuss scan results    HPI: 84 year old male with personal history of prostate cancer who presents today with an incidental 3.1 cm right lower pole renal mass which was incidentally discovered at the time of CT abdomen pelvis with contrast on 07/19/2018 as further work-up in the emergency room for constipation.  CT scan abdomen pelvis with contrast shows this 3.1 cm right endophytic renal lesion which does appear to be enhancing.  There is no noncontrast phase.  In review of previous imaging, he did have a CT scan dating back to 2012 which may have shown the same lesion, smaller measuring about 15 mm at the time.  He does have a personal history prostate cancer status post radical prostatectomy 1993 by Dr. [name] and salvage radiation in 1998.  He developed a small capacity bladder and is been managed with indwelling Foley catheter.  He is now managed by Dr. Bernardo Heater for this.  He recently had pulmonary imaging in the form of CT PE on 05/30/2018 which is negative for any evidence of metastatic disease.  He spent quite some time today perseverating about his Foley catheter.  He describes a situation where he went to the emergency room and a sample was taken from his balloon port rather than from the catheter itself.  Foley later fell out and he ended up in the ER at  [facility] to have it replaced.  He also is worried about discomfort/pressure with filling of his balloon.  He questions whether or not this is the correct size of balloon and catheter.   PMH: Past Medical History:  Diagnosis Date  . Allergy   . Anemia   . Anxiety   . Arthritis    rheumatoid  . Asthma   . CAD (coronary artery disease)   . Cancer [facility])    Prostate  . Chronic sinusitis   . Depression   .  Diverticulitis   . ED (erectile dysfunction)   . GERD (gastroesophageal reflux disease)   . History of SIADH   . Hx of adenomatous colonic polyps   . Hypertension   . Hyponatremia   . IBS (irritable bowel syndrome)   . Neurodermatitis   . Neurogenic bladder   . OSA (obstructive sleep apnea)     Surgical History: Past Surgical History:  Procedure Laterality Date  . KNEE ARTHROPLASTY Right 09/02/2015   Procedure: COMPUTER ASSISTED TOTAL KNEE ARTHROPLASTY;  Surgeon: [name], MD;  Location: [facility] ORS;  Service: Orthopedics;  Laterality: Right;  . KNEE SURGERY Left    ARTHROSCOPY LEFT  . NASAL SINUS SURGERY  2009   DEVIATED SEPTUM AND POLYPS  . permanent indwelling catheter    . PROSTATE SURGERY     PROSTATECTOMY  . TOTAL KNEE ARTHROPLASTY Left 9/15   Dr [name]  . URETHRAL STRICTURE DILATATION  02-2010   Dr.Cope    Home Medications:  Allergies as of 07/24/2018      Reactions   Ciprofloxacin Nausea And Vomiting   Headache   Citalopram Hydrobromide Other (See Comments)   "unknown"   Lorazepam    Adverse reaction   Paroxetine Nausea Only   Ramipril Other (See Comments)   "unknown"   Simvastatin    Sulfa Antibiotics Other (See Comments)      Medication  List        Accurate as of 07/24/18 11:59 PM. Always use your most recent med list.          albuterol 108 (90 Base) MCG/ACT inhaler Commonly known as:  PROVENTIL HFA;VENTOLIN HFA Inhale 2 puffs into the lungs every 6 (six) hours as needed for wheezing or shortness of breath.   ALIGN 4 MG Caps Take 1 capsule (4 mg total) by mouth daily.   ALPRAZolam 0.5 MG tablet Commonly known as:  XANAX TAKE 1 TABLET BY MOUTH THREE TIMES DAILY AS NEEDED FOR ANXIETY OR SLEEP   clopidogrel 75 MG tablet Commonly known as:  PLAVIX   Co Q 10 100 MG Caps Take 1 capsule by mouth daily.   Cranberry 425 MG Caps Take 2 capsules by mouth daily.   fluticasone 50 MCG/ACT nasal spray Commonly known as:  FLONASE Place 2 sprays  into both nostrils 2 (two) times daily.   folic acid 1 MG tablet Commonly known as:  FOLVITE Take 1 tablet (1 mg total) by mouth daily.   gabapentin 300 MG capsule Commonly known as:  NEURONTIN TAKE 1 CAPSULE BY MOUTH THREE TIMES DAILY   hydroxychloroquine 200 MG tablet Commonly known as:  PLAQUENIL Take 1 tablet by mouth 2 (two) times daily.   ibuprofen 400 MG tablet Commonly known as:  ADVIL,MOTRIN Take by mouth.   lactulose 10 GM/15ML solution Commonly known as:  CHRONULAC Take 45 mLs (30 g total) by mouth daily as needed for severe constipation.   methotrexate 2.5 MG tablet Commonly known as:  RHEUMATREX Take 10 tablets by mouth once a week.   Milk Thistle 250 MG Caps Take 1 capsule by mouth daily.   multivitamin tablet Take 1 tablet by mouth daily.   MYRBETRIQ 50 MG Tb24 tablet Generic drug:  mirabegron ER   nystatin powder Commonly known as:  MYCOSTATIN/NYSTOP Apply topically 4 (four) times daily.   omeprazole 20 MG capsule Commonly known as:  PRILOSEC Take 1 capsule (20 mg total) by mouth daily as needed.   polyethylene glycol packet Commonly known as:  MIRALAX / GLYCOLAX Take 34 g by mouth daily.   pravastatin 20 MG tablet Commonly known as:  PRAVACHOL TAKE 1 TABLET BY MOUTH EVERY DAY   Selenium 200 MCG Caps Take 1 capsule by mouth daily.   Turmeric 500 MG Caps Take 500 mg by mouth daily.   vitamin B-12 500 MCG tablet Commonly known as:  CYANOCOBALAMIN Take 500 mcg by mouth daily.   Vitamin D3 5000 units Caps Take 1 capsule by mouth daily.       Allergies:  Allergies  Allergen Reactions  . Ciprofloxacin Nausea And Vomiting    Headache  . Citalopram Hydrobromide Other (See Comments)    "unknown"  . Lorazepam     Adverse reaction  . Paroxetine Nausea Only  . Ramipril Other (See Comments)    "unknown"  . Simvastatin   . Sulfa Antibiotics Other (See Comments)    Family History: Family History  Problem Relation Age of Onset  .  Heart disease Mother 85       heart failure  . Pneumonia Father 68       pnemonia  . Skin cancer Father   . Skin cancer Sister   . Skin cancer Son   . Colon cancer Neg Hx     Social History:  reports that he has never smoked. He has never used smokeless tobacco. He reports that he drinks alcohol. He  reports that he does not use drugs.  ROS: UROLOGY Frequent Urination?: No Hard to postpone urination?: No Burning/pain with urination?: No Get up at night to urinate?: No Leakage of urine?: No Urine stream starts and stops?: No Trouble starting stream?: No Do you have to strain to urinate?: No Blood in urine?: No Urinary tract infection?: No Sexually transmitted disease?: No Injury to kidneys or bladder?: No Painful intercourse?: No Weak stream?: No Erection problems?: No Penile pain?: No  Gastrointestinal Nausea?: No Vomiting?: No Indigestion/heartburn?: No Diarrhea?: No Constipation?: Yes  Constitutional Fever: No Night sweats?: No Weight loss?: Yes Fatigue?: No  Skin Skin rash/lesions?: No Itching?: No  Eyes Blurred vision?: No Double vision?: No  Ears/Nose/Throat Sore throat?: No Sinus problems?: No  Hematologic/Lymphatic Swollen glands?: No Easy bruising?: Yes  Cardiovascular Leg swelling?: No Chest pain?: No  Respiratory Cough?: No Shortness of breath?: No  Endocrine Excessive thirst?: No  Musculoskeletal Back pain?: No Joint pain?: No  Neurological Headaches?: No Dizziness?: No  Psychologic Depression?: No Anxiety?: No  Physical Exam: BP (!) 145/75   Pulse 89   Constitutional:  Alert and oriented, No acute distress.  Elderly, alert and oriented.  Accompanied by wife and daughter. HEENT:  AT, moist mucus membranes.  Trachea midline, no masses. Cardiovascular: No clubbing, cyanosis, or edema. Respiratory: Normal respiratory effort, no increased work of breathing. GU: Foley catheter in place draining clear yellow urine. Skin:  No rashes, bruises or suspicious lesions. Neurologic: Grossly intact, no focal deficits, moving all 4 extremities. Psychiatric: Normal mood and affect.  Laboratory Data: Lab Results  Component Value Date   WBC 4.8 07/19/2018   HGB 12.8 (L) 07/19/2018   HCT 36.0 (L) 07/19/2018   MCV 93.1 07/19/2018   PLT 162 07/19/2018    Lab Results  Component Value Date   CREATININE 0.68 07/25/2018    Lab Results  Component Value Date   PSA 0.00 (L) 04/27/2017   PSA 0.01 (L) 05/18/2016   PSA 0.01 (L) 01/26/2015    Lab Results  Component Value Date   HGBA1C 5.1 05/26/2018    Urinalysis N/a  Pertinent Imaging: CLINICAL DATA:  Constipation 4 days with left lower quadrant pain.  EXAM: CT ABDOMEN AND PELVIS WITH CONTRAST  TECHNIQUE: Multidetector CT imaging of the abdomen and pelvis was performed using the standard protocol following bolus administration of intravenous contrast.  CONTRAST:  130mL ISOVUE-300 IOPAMIDOL (ISOVUE-300) INJECTION 61%  COMPARISON:  12/03/2010  FINDINGS: Lower chest: Mild left basilar atelectasis. Calcification of the mitral valve annulus. Mild calcified plaque over the lateral circumflex and right coronary arteries.  Hepatobiliary: Liver demonstrates a subcentimeter hypodensity over the left lobe unchanged likely a cyst. Gallbladder and biliary tree are normal.  Pancreas: Normal.  Spleen: Normal.  Adrenals/Urinary Tract: Adrenal glands are normal. Kidneys normal size and demonstrate a 1.3 cm cyst over the mid pole right kidney. There is a 3.1 cm enhancing mass over the lower pole right kidney. No hydronephrosis or nephrolithiasis. Ureters and bladder are within normal. Foley catheter is present within the decompressed bladder.  Stomach/Bowel: Stomach and small bowel are normal. Appendix is normal. There is colonic diverticulosis most prominent over the distal descending and sigmoid colon.  Vascular/Lymphatic: Moderate calcified  plaque over the abdominal aorta. No adenopathy.  Reproductive: Previous prostatectomy.  Other: No free fluid or focal inflammatory change.  Musculoskeletal: Moderate degenerate changes spine and mild degenerate change of the hips. Multilevel disc disease over the lumbar spine.  IMPRESSION: No acute findings in the  abdomen/pelvis. Nonobstructive bowel gas pattern.  3.1 cm enhancing mass over the lower pole right kidney likely renal cell carcinoma. No adenopathy.  1.3 cm right renal cyst. Subcentimeter left liver cyst. Colonic diverticulosis.  Aortic Atherosclerosis (ICD10-I70.0). Atherosclerotic coronary artery disease.  Left basilar atelectasis.   Electronically Signed   By: Marin Olp M.D.   On: 07/19/2018 16:55  CT scan personally reviewed today with the patient/his family.  As per above, scan from 08/2011 does show a lesion in a similar location measuring about 15 mm.  Assessment & Plan:    1. Right renal mass 3.1 cm lesion, suspicious for renal cell carcinoma endophytic within the right kidney Based on previous imaging back in 2012, I believe this lesion was likely present and has been slowly enlarging Overall growth rate is relatively small We discussed that if this is in fact a renal cell carcinoma, risk of metastatic disease with lesion 3 to 4 cm approximately 5%, relatively low We discussed options for further treatment including renal biopsy, serial imaging with surveillance to further assess growth rate, ablative therapy versus nephrectomy After lengthy discussion including risk and benefits, would most strongly recommend follow-up CT abdomen with and without contrast in 4 months in order to assess growth rate as well as provide dedicated renal imaging for further characterization of the mass His family and here agreeable this plan - CT Abd Wo & W Cm; Future  2. Neurogenic bladder Small contracted neurogenic bladder Managed with indwelling Foley  catheter  3. Problem with Foley catheter, subsequent encounter Lengthy discussion today about his indwelling Foley catheter I agree with monthly changes in our office I do not feel that the catheter needs to be upsized to the balloon needs to be enlarged Patient was reassured    Return in about 4 months (around 11/23/2018) for f/u CT scan.  Hollice Espy, MD  [facility] [address], [place], [place] [address] [phone]  I spent 25 min with this patient of which greater than 50% was spent in counseling and coordination of care with the patient.

## 2018-07-25 ENCOUNTER — Ambulatory Visit: Payer: Medicare PPO | Admitting: Internal Medicine

## 2018-07-25 ENCOUNTER — Encounter: Payer: Self-pay | Admitting: Internal Medicine

## 2018-07-25 VITALS — BP 110/78 | HR 73 | Temp 97.7°F | Ht 70.0 in | Wt 194.0 lb

## 2018-07-25 DIAGNOSIS — K5909 Other constipation: Secondary | ICD-10-CM

## 2018-07-25 DIAGNOSIS — N2889 Other specified disorders of kidney and ureter: Secondary | ICD-10-CM | POA: Diagnosis not present

## 2018-07-25 DIAGNOSIS — E222 Syndrome of inappropriate secretion of antidiuretic hormone: Secondary | ICD-10-CM

## 2018-07-25 LAB — RENAL FUNCTION PANEL
ALBUMIN: 4.1 g/dL (ref 3.5–5.2)
BUN: 11 mg/dL (ref 6–23)
CHLORIDE: 88 meq/L — AB (ref 96–112)
CO2: 29 mEq/L (ref 19–32)
Calcium: 9.4 mg/dL (ref 8.4–10.5)
Creatinine, Ser: 0.68 mg/dL (ref 0.40–1.50)
GFR: 118.6 mL/min (ref >60.00)
Glucose, Bld: 101 mg/dL — ABNORMAL HIGH (ref 70–99)
Phosphorus: 3.8 mg/dL (ref 2.3–4.6)
Potassium: 4.8 mEq/L (ref 3.5–5.1)
SODIUM: 123 meq/L — AB (ref 135–145)

## 2018-07-25 NOTE — Telephone Encounter (Signed)
Prefilled Syringes: # 150mg  4  #75mg  2 Arrival Date: 07/25/18 Lot #: 150mg  [id number]      75mg  [id number] Exp Date: 150mg  12/2018   75mg  12/2018

## 2018-07-25 NOTE — Assessment & Plan Note (Signed)
Need to recheck the sodium

## 2018-07-25 NOTE — Assessment & Plan Note (Signed)
Likely slow growing Clark Has seen oncologist and urologist Plan apparently to rescan in 4 months before any action

## 2018-07-25 NOTE — Patient Instructions (Signed)
Please add senna-s 2 tabs once or twice a day---to the miralax.

## 2018-07-25 NOTE — Assessment & Plan Note (Signed)
Asked him to add senna-s to the miralax Counseled about the variability of bowels

## 2018-07-25 NOTE — Progress Notes (Signed)
Subjective:    Patient ID: [name], male    DOB: 08-14-1934, 84 y.o.   MRN: [id number]  HPI Here for ER follow up Wife is with him Reviewed records and CT scan  Went to oncologist  Then to urologist---they found a very small mass in 2012 (1.6cm then). Now only 3cm Still suspicious for RCC Dr Cherrie Gauze note not finished---but she suggested the safest course would be to recheck in 4 months----since so slow growing  Location is technically difficult even for biopsy  Reviewed the constipation This is certainly nothing new--but has been having more trouble for 2 weeks Then no BM for 3-4 days and had "nasty pain" Got lactulose in ER---did get some liquidy stool later Has noted "horrendous" gas pains since then  Still on miralax bid  Did finally have a "reasonable" BM----after breakfast at [employer]  Had relaxed his fluid restiction Last sodium down to 127 Needs to be rechecked  Current Outpatient Medications on File Prior to Visit  Medication Sig Dispense Refill  . albuterol (PROVENTIL HFA;VENTOLIN HFA) 108 (90 Base) MCG/ACT inhaler Inhale 2 puffs into the lungs every 6 (six) hours as needed for wheezing or shortness of breath. 1 Inhaler 3  . ALPRAZolam (XANAX) 0.5 MG tablet TAKE 1 TABLET BY MOUTH THREE TIMES DAILY AS NEEDED FOR ANXIETY OR SLEEP 90 tablet 0  . Cholecalciferol (VITAMIN D3) 5000 UNITS CAPS Take 1 capsule by mouth daily.    . clopidogrel (PLAVIX) 75 MG tablet   4  . Coenzyme Q10 (CO Q 10) 100 MG CAPS Take 1 capsule by mouth daily.    . Cranberry 425 MG CAPS Take 2 capsules by mouth daily.    . fluticasone (FLONASE) 50 MCG/ACT nasal spray Place 2 sprays into both nostrils 2 (two) times daily. 48 g 3  . folic acid (FOLVITE) 1 MG tablet Take 1 tablet (1 mg total) by mouth daily. 90 tablet 3  . gabapentin (NEURONTIN) 300 MG capsule TAKE 1 CAPSULE BY MOUTH THREE TIMES DAILY 90 capsule 11  . hydroxychloroquine (PLAQUENIL) 200 MG tablet Take 1 tablet by mouth 2  (two) times daily.    Marland Kitchen ibuprofen (ADVIL,MOTRIN) 400 MG tablet Take by mouth.    . methotrexate (RHEUMATREX) 2.5 MG tablet Take 10 tablets by mouth once a week.    . Milk Thistle 250 MG CAPS Take 1 capsule by mouth daily.    . Multiple Vitamin (MULTIVITAMIN) tablet Take 1 tablet by mouth daily.      Marland Kitchen MYRBETRIQ 50 MG TB24 tablet   11  . nystatin (MYCOSTATIN/NYSTOP) powder Apply topically 4 (four) times daily. 15 g 2  . omeprazole (PRILOSEC) 20 MG capsule Take 1 capsule (20 mg total) by mouth daily as needed. 90 capsule 1  . polyethylene glycol (GLYCOLAX) packet Take 34 g by mouth daily.     . pravastatin (PRAVACHOL) 20 MG tablet TAKE 1 TABLET BY MOUTH EVERY DAY 90 tablet 3  . Probiotic Product (ALIGN) 4 MG CAPS Take 1 capsule (4 mg total) by mouth daily. 15 capsule 0  . Selenium 200 MCG CAPS Take 1 capsule by mouth daily.    . Turmeric 500 MG CAPS Take 500 mg by mouth daily.    . vitamin B-12 (CYANOCOBALAMIN) 500 MCG tablet Take 500 mcg by mouth daily.     Current Facility-Administered Medications on File Prior to Visit  Medication Dose Route Frequency Provider Last Rate Last Dose  . omalizumab Arvid Right) injection 375 mg  375  mg Subcutaneous Q14 Days Chesley Mires, MD   375 mg at 06/04/18 0935  . omalizumab Arvid Right) injection 375 mg  375 mg Subcutaneous Q14 Days Chesley Mires, MD   375 mg at 06/21/18 1431    Allergies  Allergen Reactions  . Ciprofloxacin Nausea And Vomiting    Headache  . Citalopram Hydrobromide Other (See Comments)    "unknown"  . Lorazepam     Adverse reaction  . Paroxetine Nausea Only  . Ramipril Other (See Comments)    "unknown"  . Simvastatin   . Sulfa Antibiotics Other (See Comments)    Past Medical History:  Diagnosis Date  . Allergy   . Anemia   . Anxiety   . Arthritis    rheumatoid  . Asthma   . CAD (coronary artery disease)   . Cancer [facility])    Prostate  . Chronic sinusitis   . Depression   . Diverticulitis   . ED (erectile dysfunction)   .  GERD (gastroesophageal reflux disease)   . History of SIADH   . Hx of adenomatous colonic polyps   . Hypertension   . Hyponatremia   . IBS (irritable bowel syndrome)   . Neurodermatitis   . Neurogenic bladder   . OSA (obstructive sleep apnea)     Past Surgical History:  Procedure Laterality Date  . KNEE ARTHROPLASTY Right 09/02/2015   Procedure: COMPUTER ASSISTED TOTAL KNEE ARTHROPLASTY;  Surgeon: [name], MD;  Location: [facility] ORS;  Service: Orthopedics;  Laterality: Right;  . KNEE SURGERY Left    ARTHROSCOPY LEFT  . NASAL SINUS SURGERY  2009   DEVIATED SEPTUM AND POLYPS  . permanent indwelling catheter    . PROSTATE SURGERY     PROSTATECTOMY  . TOTAL KNEE ARTHROPLASTY Left 9/15   Dr [name]  . URETHRAL STRICTURE DILATATION  02-2010   Dr.Cope    Family History  Problem Relation Age of Onset  . Heart disease Mother 14       heart failure  . Pneumonia Father 37       pnemonia  . Skin cancer Father   . Skin cancer Sister   . Skin cancer Son   . Colon cancer Neg Hx     Social History   Socioeconomic History  . Marital status: Married    Spouse name: Not on file  . Number of children: 2  . Years of education: Not on file  . Highest education level: Not on file  Occupational History  . 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 Financial resource strain: Not on file  . Food insecurity:    Worry: Not on file    Inability: Not on file  . Transportation needs:    Medical: Not on file    Non-medical: Not on file  Tobacco Use  . Smoking status: Never Smoker  . Smokeless tobacco: Never Used  Substance and Sexual Activity  . Alcohol use: Yes    Comment: 5 days out of 7 drinks gin or wine interchangeably   . Drug use: No  . Sexual activity: Not Currently  Lifestyle  . Physical activity:    Days per week: Not on file    Minutes per session: Not on file  . Stress: Not on file  Relationships  . Social connections:    Talks on  phone: Not on file    Gets together: Not on file    Attends religious service: Not on file  Active member of club or organization: Not on file    Attends meetings of clubs or organizations: Not on file    Relationship status: Not on file  . Intimate partner violence:    Fear of current or ex partner: Not on file    Emotionally abused: Not on file    Physically abused: Not on file    Forced sexual activity: Not on file  Other Topics Concern  . Not on file  Social History Narrative   Not sure about a living will or health care POA   Wife should make health care decisions for him--then daughter, then son   Would accept resuscitation attempts   Not sure about tube feeds   Review of Systems Eating less now---wife feels he doesn't realize that this will change his bowels Slight weight loss No N/V    Objective:   Physical Exam  Constitutional: He appears well-developed. No distress.  GI: Soft. He exhibits no distension. There is no tenderness. There is no rebound and no guarding.           Assessment & Plan:

## 2018-07-26 ENCOUNTER — Ambulatory Visit (INDEPENDENT_AMBULATORY_CARE_PROVIDER_SITE_OTHER): Payer: Medicare PPO

## 2018-07-26 ENCOUNTER — Other Ambulatory Visit: Payer: Self-pay | Admitting: Internal Medicine

## 2018-07-26 DIAGNOSIS — J4540 Moderate persistent asthma, uncomplicated: Secondary | ICD-10-CM

## 2018-07-26 DIAGNOSIS — E871 Hypo-osmolality and hyponatremia: Secondary | ICD-10-CM

## 2018-07-26 MED ORDER — OMALIZUMAB 150 MG ~~LOC~~ SOLR
375.0000 mg | Freq: Once | SUBCUTANEOUS | Status: AC
  Administered 2018-07-26: 375 mg via SUBCUTANEOUS

## 2018-07-27 ENCOUNTER — Emergency Department
Admission: EM | Admit: 2018-07-27 | Discharge: 2018-07-27 | Disposition: A | Discharge status: Home / Self Care | Payer: Medicare PPO | Attending: Emergency Medicine | Admitting: Emergency Medicine

## 2018-07-27 ENCOUNTER — Encounter: Payer: Self-pay | Admitting: Emergency Medicine

## 2018-07-27 DIAGNOSIS — I2510 Atherosclerotic heart disease of native coronary artery without angina pectoris: Secondary | ICD-10-CM | POA: Insufficient documentation

## 2018-07-27 DIAGNOSIS — Z8546 Personal history of malignant neoplasm of prostate: Secondary | ICD-10-CM | POA: Diagnosis not present

## 2018-07-27 DIAGNOSIS — Z8673 Personal history of transient ischemic attack (TIA), and cerebral infarction without residual deficits: Secondary | ICD-10-CM | POA: Insufficient documentation

## 2018-07-27 DIAGNOSIS — I10 Essential (primary) hypertension: Secondary | ICD-10-CM | POA: Insufficient documentation

## 2018-07-27 DIAGNOSIS — E871 Hypo-osmolality and hyponatremia: Secondary | ICD-10-CM | POA: Insufficient documentation

## 2018-07-27 DIAGNOSIS — M069 Rheumatoid arthritis, unspecified: Secondary | ICD-10-CM | POA: Insufficient documentation

## 2018-07-27 DIAGNOSIS — Z96651 Presence of right artificial knee joint: Secondary | ICD-10-CM | POA: Diagnosis not present

## 2018-07-27 LAB — COMPREHENSIVE METABOLIC PANEL
ALK PHOS: 56 U/L (ref 38–126)
ALT: 16 U/L (ref 0–44)
ANION GAP: 9 (ref 5–15)
AST: 23 U/L (ref 15–41)
Albumin: 3.8 g/dL (ref 3.5–5.0)
BILIRUBIN TOTAL: 0.7 mg/dL (ref 0.3–1.2)
BUN: 11 mg/dL (ref 8–23)
CHLORIDE: 89 mmol/L — AB (ref 98–111)
CO2: 29 mmol/L (ref 22–32)
Calcium: 8.9 mg/dL (ref 8.9–10.3)
Creatinine, Ser: 0.47 mg/dL — ABNORMAL LOW (ref 0.61–1.24)
Glucose, Bld: 119 mg/dL — ABNORMAL HIGH (ref 70–99)
POTASSIUM: 4.3 mmol/L (ref 3.5–5.1)
SODIUM: 127 mmol/L — AB (ref 135–145)
Total Protein: 6.2 g/dL — ABNORMAL LOW (ref 6.5–8.1)

## 2018-07-27 LAB — CBC
HCT: 37.2 % — ABNORMAL LOW (ref 40.0–52.0)
HEMOGLOBIN: 13.4 g/dL (ref 13.0–18.0)
MCH: 33.2 pg (ref 26.0–34.0)
MCHC: 36 g/dL (ref 32.0–36.0)
MCV: 92.4 fL (ref 80.0–100.0)
Platelets: 207 10*3/uL (ref 150–440)
RBC: 4.02 MIL/uL — ABNORMAL LOW (ref 4.40–5.90)
RDW: 16.4 % — ABNORMAL HIGH (ref 11.5–14.5)
WBC: 5.8 10*3/uL (ref 3.8–10.6)

## 2018-07-27 NOTE — ED Notes (Signed)
Pt denies any pain dizzinees a this time. Pt denies N/V/D. VSS. NAD. Family at bedside.

## 2018-07-27 NOTE — ED Provider Notes (Signed)
[facility] Emergency Department Provider Note ____________________________________________   First MD Initiated Contact with Patient 07/27/18 1733     (approximate)  I have reviewed the triage vital signs and the nursing notes.   HISTORY  Chief Complaint No chief complaint on file.   HPI [name] is a 84 y.o. male with a history of hyponatremia for which he takes sodium chloride tabs as well as is on a 1200 cc fluid reduction and followed by Dr. Zollie Scale was presented to the emergency department concerned about low sodium.  He said that he was at Dr. Precious Gilding office 2 days ago and had blood taken and then was called about the blood work and found out that he had a sodium of 123.  He then took a dose of Lasix last night.  He says that he was nauseous today but the symptoms have now resolved.  He denies any pain.  Does not report any shortness of breath.  Says that he will be following up with Dr. Zollie Scale this coming Wednesday.  He says that he also has not been eating as much as he normally would be and says that he would like to try anymore.  Past Medical History:  Diagnosis Date  . Allergy   . Anemia   . Anxiety   . Arthritis    rheumatoid  . Asthma   . CAD (coronary artery disease)   . Cancer [facility])    Prostate  . Chronic sinusitis   . Depression   . Diverticulitis   . ED (erectile dysfunction)   . GERD (gastroesophageal reflux disease)   . History of SIADH   . Hx of adenomatous colonic polyps   . Hypertension   . Hyponatremia   . IBS (irritable bowel syndrome)   . Neurodermatitis   . Neurogenic bladder   . OSA (obstructive sleep apnea)     Patient Active Problem List   Diagnosis Date Noted  . Chronic constipation 07/25/2018  . Right renal mass 07/25/2018  . TIA (transient ischemic attack) 06/06/2018  . Carotid artery disease ([place]) 06/06/2018  . Expressive aphasia 05/25/2018  . Complicated UTI (urinary tract infection) 10/18/2017  . SIADH  (syndrome of inappropriate ADH production) ([place]) 04/17/2017  . Hyponatremia 01/16/2017  . Asthma with acute exacerbation 11/14/2016  . Lobar pneumonia ([place]) 11/14/2016  . Mood disorder ([place]) 01/18/2016  . Presence of indwelling urinary catheter 08/20/2015  . Advanced directives, counseling/discussion 07/28/2014  . Routine general medical examination at a health care facility 04/09/2012  . History of prostate cancer 04/09/2012  . Venous stasis dermatitis 06/13/2011  . Chronic sinusitis   . CONSTIPATION, CHRONIC 12/07/2010  . Hyperlipemia 12/02/2010  . OSTEOARTHRITIS 12/02/2010  . NEUROGENIC BLADDER 05/24/2010  . IBS 05/05/2010  . PERSONAL HISTORY OF COLONIC POLYPS 05/05/2010  . NEUROPATHY 05/03/2010  . Essential hypertension, benign 03/25/2009  . GAD (generalized anxiety disorder) 01/27/2007  . Coronary atherosclerosis of native coronary artery 01/27/2007  . Allergic asthma 01/27/2007  . GERD 01/27/2007  . Chronic rheumatic arthritis ([place]) 01/27/2007    Past Surgical History:  Procedure Laterality Date  . KNEE ARTHROPLASTY Right 09/02/2015   Procedure: COMPUTER ASSISTED TOTAL KNEE ARTHROPLASTY;  Surgeon: [name], MD;  Location: [facility] ORS;  Service: Orthopedics;  Laterality: Right;  . KNEE SURGERY Left    ARTHROSCOPY LEFT  . NASAL SINUS SURGERY  2009   DEVIATED SEPTUM AND POLYPS  . permanent indwelling catheter    . PROSTATE SURGERY  PROSTATECTOMY  . TOTAL KNEE ARTHROPLASTY Left 9/15   Dr [name]  . URETHRAL STRICTURE DILATATION  02-2010   Dr.Cope    Prior to Admission medications   Medication Sig Start Date End Date Taking? Authorizing Provider  albuterol (PROVENTIL HFA;VENTOLIN HFA) 108 (90 Base) MCG/ACT inhaler Inhale 2 puffs into the lungs every 6 (six) hours as needed for wheezing or shortness of breath. 11/14/16   [name], MD  ALPRAZolam Duanne Moron) 0.5 MG tablet TAKE 1 TABLET BY MOUTH THREE TIMES DAILY AS NEEDED FOR ANXIETY OR SLEEP 06/18/18   Tower,  Wynelle Fanny, MD  Cholecalciferol (VITAMIN D3) 5000 UNITS CAPS Take 1 capsule by mouth daily.    [provider]  clopidogrel (PLAVIX) 75 MG tablet  06/20/18   [provider]  Coenzyme Q10 (CO Q 10) 100 MG CAPS Take 1 capsule by mouth daily.    [provider]  Cranberry 425 MG CAPS Take 2 capsules by mouth daily.    [provider]  fluticasone (FLONASE) 50 MCG/ACT nasal spray Place 2 sprays into both nostrils 2 (two) times daily. 04/27/17   [name], MD  folic acid (FOLVITE) 1 MG tablet Take 1 tablet (1 mg total) by mouth daily. 04/27/17   [name], MD  gabapentin (NEURONTIN) 300 MG capsule TAKE 1 CAPSULE BY MOUTH THREE TIMES DAILY 07/12/16   [name], MD  hydroxychloroquine (PLAQUENIL) 200 MG tablet Take 1 tablet by mouth 2 (two) times daily. 01/22/16   [provider]  ibuprofen (ADVIL,MOTRIN) 400 MG tablet Take by mouth. 04/27/18   [provider]  methotrexate (RHEUMATREX) 2.5 MG tablet Take 10 tablets by mouth once a week. 01/07/16   [provider]  Milk Thistle 250 MG CAPS Take 1 capsule by mouth daily.    [provider]  Multiple Vitamin (MULTIVITAMIN) tablet Take 1 tablet by mouth daily.      [provider]  MYRBETRIQ 50 MG TB24 tablet  06/17/18   [provider]  nystatin (MYCOSTATIN/NYSTOP) powder Apply topically 4 (four) times daily. 05/17/18   [name], MD  omeprazole (PRILOSEC) 20 MG capsule Take 1 capsule (20 mg total) by mouth daily as needed. 01/01/18   [name], MD  polyethylene glycol [facility]) packet Take 34 g by mouth daily.     [provider]  pravastatin (PRAVACHOL) 20 MG tablet TAKE 1 TABLET BY MOUTH EVERY DAY 05/29/17   [name], MD  Probiotic Product (ALIGN) 4 MG CAPS Take 1 capsule (4 mg total) by mouth daily. 12/15/16   Nicholes Mango, MD  Selenium 200 MCG CAPS Take 1 capsule by mouth daily.    [provider]  Turmeric 500  MG CAPS Take 500 mg by mouth daily.    [provider]  vitamin B-12 (CYANOCOBALAMIN) 500 MCG tablet Take 500 mcg by mouth daily.    [provider]    Allergies Ciprofloxacin; Citalopram hydrobromide; Lorazepam; Paroxetine; Ramipril; Simvastatin; and Sulfa antibiotics  Family History  Problem Relation Age of Onset  . Heart disease Mother 71       heart failure  . Pneumonia Father 62       pnemonia  . Skin cancer Father   . Skin cancer Sister   . Skin cancer Son   . Colon cancer Neg Hx     Social History Social History   Tobacco Use  . Smoking status: Never Smoker  . Smokeless tobacco: Never Used  Substance Use Topics  . Alcohol use: Yes    Comment: 5 days out of 7 drinks gin or wine interchangeably   . Drug use: No    Review of Systems  Constitutional: No fever/chills Eyes: No visual changes. ENT: No sore throat. Cardiovascular: Denies chest pain. Respiratory: Denies shortness of breath. Gastrointestinal: No abdominal pain. no vomiting.  No diarrhea.  No constipation. Genitourinary: Negative for dysuria. Musculoskeletal: Negative for back pain. Skin: Negative for rash. Neurological: Negative for headaches, focal weakness or numbness.   ____________________________________________   PHYSICAL EXAM:  VITAL SIGNS: ED Triage Vitals  Enc Vitals Group     BP 07/27/18 1600 139/61     Pulse Rate 07/27/18 1600 88     Resp 07/27/18 1600 18     Temp 07/27/18 1600 97.9 F (36.6 C)     Temp Source 07/27/18 1600 Oral     SpO2 07/27/18 1600 97 %     Weight 07/27/18 1603 194 lb (88 kg)     Height 07/27/18 1603 5\' 10"  (1.778 m)     Head Circumference --      Peak Flow --      Pain Score 07/27/18 1608 0     Pain Loc --      Pain Edu? --      Excl. in [place]? --     Constitutional: Alert and oriented. Well appearing and in no acute distress. Eyes: Conjunctivae are normal.  Head: Atraumatic. Nose: No congestion/rhinnorhea. Mouth/Throat: Mucous  membranes are moist.  Neck: No stridor.   Cardiovascular: Normal rate, regular rhythm. Grossly normal heart sounds.  Good peripheral circulation. Respiratory: Normal respiratory effort.  No retractions. Lungs CTAB. Gastrointestinal: Soft and nontender. No distention. Musculoskeletal: Trace bilateral lower extremity edema.  No joint effusions. Neurologic:  Normal speech and language. No gross focal neurologic deficits are appreciated. Skin:  Skin is warm, dry and intact. No rash noted. Psychiatric: Mood and affect are normal. Speech and behavior are normal.  ____________________________________________   LABS (all labs ordered are listed, but only abnormal results are displayed)  Labs Reviewed  CBC - Abnormal; Notable for the following components:      Result Value   RBC 4.02 (*)    HCT 37.2 (*)    RDW 16.4 (*)    All other components within normal limits  COMPREHENSIVE METABOLIC PANEL - Abnormal; Notable for the following components:   Sodium 127 (*)    Chloride 89 (*)    Glucose, Bld 119 (*)    Creatinine, Ser 0.47 (*)    Total Protein 6.2 (*)    All other components within normal limits   ____________________________________________  EKG   ____________________________________________  RADIOLOGY   ____________________________________________   PROCEDURES  Procedure(s) performed:   Procedures  Critical Care performed:   ____________________________________________   INITIAL IMPRESSION / ASSESSMENT AND PLAN / ED COURSE  Pertinent labs & imaging results that were available during my care of the patient were reviewed by me and considered in my medical decision making (see chart for details).  DDX: Hyponatremia, malnutrition, SIADH, CHF, chronic hyponatremia, nausea, symptomatic hyponatremia As part of my medical decision making, I reviewed the following data within the [facility] Notes from prior ED visits  Reviewed multiple past sodium  levels and the patient seems to be around his average sodium level at this time and is asymptomatic.  He is also saying now that he is unsure how many times a day and how many days  a week he is taking his sodium pills.  Because he is asymptomatic and his sodium is improved I will not recommend any further interventions.  He will continue his Lasix as needed and his daughters in the room and will ensure that he is taking his sodium as prescribed daily.  He will continue to follow-up with Dr. Zollie Scale and return to the emergency department for any worsening concerning symptoms. ____________________________________________   FINAL CLINICAL IMPRESSION(S) / ED DIAGNOSES  Hyponatremia.  NEW MEDICATIONS STARTED DURING THIS VISIT:  New Prescriptions   No medications on file     Note:  This document was prepared using Dragon voice recognition software and may include unintentional dictation errors.     Orbie Pyo, MD 07/27/18 [id number]

## 2018-07-27 NOTE — ED Triage Notes (Signed)
FIRST NURSE NOTE-sent for low NA of 123. Ambulatory, NAD

## 2018-07-31 ENCOUNTER — Other Ambulatory Visit (INDEPENDENT_AMBULATORY_CARE_PROVIDER_SITE_OTHER): Payer: Medicare PPO

## 2018-07-31 DIAGNOSIS — E871 Hypo-osmolality and hyponatremia: Secondary | ICD-10-CM | POA: Diagnosis not present

## 2018-07-31 LAB — RENAL FUNCTION PANEL
ALBUMIN: 4.1 g/dL (ref 3.5–5.2)
BUN: 14 mg/dL (ref 6–23)
CALCIUM: 9.3 mg/dL (ref 8.4–10.5)
CO2: 31 meq/L (ref 19–32)
CREATININE: 0.72 mg/dL (ref 0.40–1.50)
Chloride: 90 mEq/L — ABNORMAL LOW (ref 96–112)
GFR: 111.03 mL/min (ref >60.00)
GLUCOSE: 99 mg/dL (ref 70–99)
PHOSPHORUS: 3.7 mg/dL (ref 2.3–4.6)
Potassium: 5.2 mEq/L — ABNORMAL HIGH (ref 3.5–5.1)
Sodium: 127 mEq/L — ABNORMAL LOW (ref 135–145)

## 2018-08-01 DIAGNOSIS — D649 Anemia, unspecified: Secondary | ICD-10-CM | POA: Diagnosis not present

## 2018-08-01 DIAGNOSIS — N2889 Other specified disorders of kidney and ureter: Secondary | ICD-10-CM | POA: Diagnosis not present

## 2018-08-01 DIAGNOSIS — R600 Localized edema: Secondary | ICD-10-CM | POA: Diagnosis not present

## 2018-08-01 DIAGNOSIS — E871 Hypo-osmolality and hyponatremia: Secondary | ICD-10-CM | POA: Diagnosis not present

## 2018-08-04 ENCOUNTER — Other Ambulatory Visit: Payer: Self-pay | Admitting: Family Medicine

## 2018-08-06 ENCOUNTER — Other Ambulatory Visit: Payer: Self-pay | Admitting: Internal Medicine

## 2018-08-06 NOTE — Telephone Encounter (Signed)
Last filled 06-18-18 #90 Last OV 07-25-18 Next OV 08-29-18

## 2018-08-07 ENCOUNTER — Ambulatory Visit (INDEPENDENT_AMBULATORY_CARE_PROVIDER_SITE_OTHER): Payer: Medicare PPO

## 2018-08-07 ENCOUNTER — Telehealth: Payer: Self-pay | Admitting: Urology

## 2018-08-07 DIAGNOSIS — N319 Neuromuscular dysfunction of bladder, unspecified: Secondary | ICD-10-CM | POA: Diagnosis not present

## 2018-08-07 DIAGNOSIS — T839XXD Unspecified complication of genitourinary prosthetic device, implant and graft, subsequent encounter: Secondary | ICD-10-CM

## 2018-08-07 NOTE — Telephone Encounter (Signed)
Pt states he has indwelling catheter, pt states he was producing urine at night and during the day, noticed yesterday he had very little out put, and no output at all last night, only shows very little blood in his leg bag. Pt states he is having painful bladder spasms. He has to push hard to get some relief and urine comes out around his penis. Pt added to nurse schedule

## 2018-08-07 NOTE — Progress Notes (Signed)
Pt in present in office today with concerns about his foley catheter that is placed. Pt reports leakage and pain. He expresses concern that his catheter is not draining. Upon examination, there was 100cc of urine in the pt leg bag. PVR indicated 0cc of urine present in the bladder. Pt catheter was irrigated using sterile water. 60cc of water was inserted into catheter, pt began to vocalize pain and discomfort and he began urinating from the meatus of th penis. Pt informed he was having bladder spasms and was told to follow-up with his provider. Pt currently on Myrbetriq 50mg .   Bladder Irrigation  Due to possible retention patient is present today for a bladder irrigation. Patient was cleaned and prepped in a sterile fashion. 60 ml of saline/sterile water was instilled and irrigated into the bladder with a 20ml [place] syringe through the catheter in place.  After irrigation urine flow was noted no complications were noted catheter is now draining fine.  Catheter was reattached to the leg bag for drainage. Patient tolerated well.   Preformed by: Cristie Hem, CMA

## 2018-08-08 ENCOUNTER — Ambulatory Visit: Payer: Medicare PPO | Admitting: Physician Assistant

## 2018-08-10 ENCOUNTER — Ambulatory Visit: Payer: Medicare PPO | Admitting: Physician Assistant

## 2018-08-10 ENCOUNTER — Encounter: Payer: Self-pay | Admitting: Physician Assistant

## 2018-08-10 VITALS — BP 108/64 | HR 71 | Ht 70.0 in | Wt 192.0 lb

## 2018-08-10 DIAGNOSIS — K5901 Slow transit constipation: Secondary | ICD-10-CM | POA: Diagnosis not present

## 2018-08-10 NOTE — Progress Notes (Signed)
Reviewed and agree with initial management plan.  Malcolm T. Stark, MD FACG 

## 2018-08-10 NOTE — Progress Notes (Signed)
Chief Complaint: Constipation  HPI:    [name] is an 84 year old male with a past medical history as listed below, recent echo 05/26/2018 showed an LVEF 60-65%) known to Dr. Fuller Plan for his slow transit constipation, who presents clinic today with a complaint of constipation.    03/15/2016 office visit for evaluation of change in bowel habits.  Was noted patient underwent colonoscopy in June 2011 for personal history of adenomatous colon polyps showing only moderate left colon diverticulosis.  Patient relayed long-term difficulties with constipation managed with various laxatives.  At that time patient was titrated to titrate dose of MiraLAX between 2-3 capfuls per day, maintain a high-fiber diet and discontinue Senokot and attempt to avoid or at least minimize the use of Dulcolax.  He was also scheduled for a colonoscopy.    08/16/2016 colonoscopy showed moderate diverticulosis in the sigmoid colon and descending colon and was otherwise normal.  No repeat was recommended due to age.    Today, patient explains that he has had trouble with constipation ever since he had radiation for prostate cancer years ago.  Tells me that initially he was able to control this with MiraLAX 1-2 times a day but in the recent months has not had any results from this.  Also describes increase in generalized abdominal cramping as well as gas pains over the past month and tells me that he has been trying to strain more than usual.  He believes this has resulted in an increase in his bladder spasms over the past 2 weeks.  Most recently saw his PCP who told him to use MiraLAX and senna S 2 at night and 2 in the morning.  Patient was nervous to do this much medicine as he has had some trouble with fecal incontinence in the past so he has only been using 2 senna at night occasionally.  Also has added prunes to try and help but nothing seems to let him go regularly.    Does tell me that he is on fluid restrictions due to his  hyponatremia.    Denies fever, chills, blood in his stool or symptoms that awaken him from sleep.  Past Medical History:  Diagnosis Date  . Allergy   . Anemia   . Anxiety   . Arthritis    rheumatoid  . Asthma   . CAD (coronary artery disease)   . Cancer [facility])    Prostate  . Chronic sinusitis   . Depression   . Diverticulitis   . ED (erectile dysfunction)   . GERD (gastroesophageal reflux disease)   . History of SIADH   . Hx of adenomatous colonic polyps   . Hypertension   . Hyponatremia   . IBS (irritable bowel syndrome)   . Neurodermatitis   . Neurogenic bladder   . OSA (obstructive sleep apnea)     Past Surgical History:  Procedure Laterality Date  . KNEE ARTHROPLASTY Right 09/02/2015   Procedure: COMPUTER ASSISTED TOTAL KNEE ARTHROPLASTY;  Surgeon: [name], MD;  Location: [facility] ORS;  Service: Orthopedics;  Laterality: Right;  . KNEE SURGERY Left    ARTHROSCOPY LEFT  . NASAL SINUS SURGERY  2009   DEVIATED SEPTUM AND POLYPS  . permanent indwelling catheter    . PROSTATE SURGERY     PROSTATECTOMY  . TOTAL KNEE ARTHROPLASTY Left 9/15   Dr [name]  . URETHRAL STRICTURE DILATATION  02-2010   Dr.Cope    Current Outpatient Medications  Medication Sig Dispense Refill  .  albuterol (PROVENTIL HFA;VENTOLIN HFA) 108 (90 Base) MCG/ACT inhaler Inhale 2 puffs into the lungs [name] 6 (six) hours as needed for wheezing or shortness of breath. 1 Inhaler 3  . ALPRAZolam (XANAX) 0.5 MG tablet TAKE 1 TABLET BY MOUTH THREE TIMES DAILY AS NEEDED FOR ANXIETY OR SLEEP 90 tablet 0  . Cholecalciferol (VITAMIN D3) 5000 UNITS CAPS Take 1 capsule by mouth daily.    . clopidogrel (PLAVIX) 75 MG tablet   4  . Coenzyme Q10 (CO Q 10) 100 MG CAPS Take 1 capsule by mouth daily.    . Cranberry 425 MG CAPS Take 2 capsules by mouth daily.    . fluticasone (FLONASE) 50 MCG/ACT nasal spray Place 2 sprays into both nostrils 2 (two) times daily. 48 g 3  . folic acid (FOLVITE) 1 MG tablet Take 1  tablet (1 mg total) by mouth daily. 90 tablet 3  . gabapentin (NEURONTIN) 300 MG capsule TAKE 1 CAPSULE BY MOUTH THREE TIMES DAILY 90 capsule 11  . hydroxychloroquine (PLAQUENIL) 200 MG tablet Take 1 tablet by mouth 2 (two) times daily.    Marland Kitchen ibuprofen (ADVIL,MOTRIN) 400 MG tablet Take by mouth.    . methotrexate (RHEUMATREX) 2.5 MG tablet Take 10 tablets by mouth once a week.    . Milk Thistle 250 MG CAPS Take 1 capsule by mouth daily.    . Multiple Vitamin (MULTIVITAMIN) tablet Take 1 tablet by mouth daily.      Marland Kitchen MYRBETRIQ 50 MG TB24 tablet   11  . nystatin (MYCOSTATIN/NYSTOP) powder Apply topically 4 (four) times daily. 15 g 2  . omeprazole (PRILOSEC) 20 MG capsule Take 1 capsule (20 mg total) by mouth daily as needed. 90 capsule 1  . polyethylene glycol (GLYCOLAX) packet Take 34 g by mouth daily.     . pravastatin (PRAVACHOL) 20 MG tablet TAKE 1 TABLET BY MOUTH [name] DAY 90 tablet 3  . Probiotic Product (ALIGN) 4 MG CAPS Take 1 capsule (4 mg total) by mouth daily. 15 capsule 0  . Selenium 200 MCG CAPS Take 1 capsule by mouth daily.    . Turmeric 500 MG CAPS Take 500 mg by mouth daily.    . vitamin B-12 (CYANOCOBALAMIN) 500 MCG tablet Take 500 mcg by mouth daily.     Current Facility-Administered Medications  Medication Dose Route Frequency Provider Last Rate Last Dose  . omalizumab Arvid Right) injection 375 mg  375 mg Subcutaneous Q14 Days Chesley Mires, MD   375 mg at 06/04/18 0935  . omalizumab Arvid Right) injection 375 mg  375 mg Subcutaneous Q14 Days Chesley Mires, MD   375 mg at 06/21/18 1431    Allergies as of 08/10/2018 - Review Complete 07/27/2018  Allergen Reaction Noted  . Ciprofloxacin Nausea And Vomiting 06/05/2007  . Citalopram hydrobromide Other (See Comments) 06/05/2007  . Lorazepam  06/05/2007  . Paroxetine Nausea Only 06/05/2007  . Ramipril Other (See Comments) 06/05/2007  . Simvastatin  06/05/2007  . Sulfa antibiotics Other (See Comments) 03/08/2013    Family History    Problem Relation Age of Onset  . Heart disease Mother 9       heart failure  . Pneumonia Father 57       pnemonia  . Skin cancer Father   . Skin cancer Sister   . Skin cancer Son   . Colon cancer Neg Hx     Social History   Socioeconomic History  . Marital status: Married    Spouse name: Not on file  .  Number of children: 2  . Years of education: Not on file  . Highest education level: Not on file  Occupational History  . 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 Financial resource strain: Not on file  . Food insecurity:    Worry: Not on file    Inability: Not on file  . Transportation needs:    Medical: Not on file    Non-medical: Not on file  Tobacco Use  . Smoking status: Never Smoker  . Smokeless tobacco: Never Used  Substance and Sexual Activity  . Alcohol use: Yes    Comment: 5 days out of 7 drinks gin or wine interchangeably   . Drug use: No  . Sexual activity: Not Currently  Lifestyle  . Physical activity:    Days per week: Not on file    Minutes per session: Not on file  . Stress: Not on file  Relationships  . Social connections:    Talks on phone: Not on file    Gets together: Not on file    Attends religious service: Not on file    Active member of club or organization: Not on file    Attends meetings of clubs or organizations: Not on file    Relationship status: Not on file  . Intimate partner violence:    Fear of current or ex partner: Not on file    Emotionally abused: Not on file    Physically abused: Not on file    Forced sexual activity: Not on file  Other Topics Concern  . Not on file  Social History Narrative   Not sure about a living will or health care POA   Wife should make health care decisions for him--then daughter, then son   Would accept resuscitation attempts   Not sure about tube feeds    Review of Systems:    Constitutional: No weight loss, fever or chills Cardiovascular: No chest  pain Respiratory: No SOB  Gastrointestinal: See HPI and otherwise negative   Physical Exam:  Vital signs: BP 108/64   Pulse 71   Ht 5\' 10"  (1.778 m)   Wt 192 lb (87.1 kg)   BMI 27.55 kg/m   Constitutional:   Pleasant Elderly Caucasian male appears to be in NAD, Well developed, Well nourished, alert and cooperative Respiratory: Respirations even and unlabored. Lungs clear to auscultation bilaterally.   No wheezes, crackles, or rhonchi.  Cardiovascular: Normal S1, S2. No MRG. Regular rate and rhythm. No peripheral edema, cyanosis or pallor.  Gastrointestinal:  Soft, nondistended, nontender. No rebound or guarding. Normal bowel sounds. No appreciable masses or hepatomegaly. Rectal:  Not performed.  Msk:  Symmetrical without gross deformities. Without edema, no deformity or joint abnormality. +ambulates with walker Psychiatric: Demonstrates good judgement and reason without abnormal affect or behaviors.  MOST RECENT LABS AND IMAGING: CBC    Component Value Date/Time   WBC 5.8 07/27/2018 1618   RBC 4.02 (L) 07/27/2018 1618   HGB 13.4 07/27/2018 1618   HGB 11.1 (L) 09/28/2014 1610   HCT 37.2 (L) 07/27/2018 1618   HCT 33.6 (L) 09/28/2014 1610   PLT 207 07/27/2018 1618   PLT 192 09/28/2014 1610   MCV 92.4 07/27/2018 1618   MCV 96 09/28/2014 1610   MCH 33.2 07/27/2018 1618   MCHC 36.0 07/27/2018 1618   RDW 16.4 (H) 07/27/2018 1618   RDW 16.9 (H) 09/28/2014 1610   LYMPHSABS 0.8 (L) 05/25/2018 1953   LYMPHSABS  1.3 09/09/2014 0652   MONOABS 0.4 05/25/2018 1953   MONOABS 0.4 09/09/2014 0652   EOSABS 0.3 05/25/2018 1953   EOSABS 0.5 09/09/2014 0652   BASOSABS 0.0 05/25/2018 1953   BASOSABS 0.1 09/09/2014 0652    CMP     Component Value Date/Time   NA 127 (L) 07/31/2018 1413   NA 138 09/28/2014 1610   K 5.2 (H) 07/31/2018 1413   K 4.6 09/28/2014 1610   CL 90 (L) 07/31/2018 1413   CL 101 09/28/2014 1610   CO2 31 07/31/2018 1413   CO2 30 09/28/2014 1610   GLUCOSE 99  07/31/2018 1413   GLUCOSE 137 (H) 09/28/2014 1610   GLUCOSE 109 (H) 10/05/2006 1611   BUN 14 07/31/2018 1413   BUN 16 09/28/2014 1610   CREATININE 0.72 07/31/2018 1413   CREATININE 0.62 09/28/2014 1610   CALCIUM 9.3 07/31/2018 1413   CALCIUM 8.2 (L) 09/28/2014 1610   PROT 6.2 (L) 07/27/2018 1618   PROT 5.9 (L) 09/28/2014 1610   ALBUMIN 4.1 07/31/2018 1413   ALBUMIN 3.2 (L) 09/28/2014 1610   AST 23 07/27/2018 1618   AST 36 09/28/2014 1610   ALT 16 07/27/2018 1618   ALT 26 09/28/2014 1610   ALKPHOS 56 07/27/2018 1618   ALKPHOS 49 09/28/2014 1610   BILITOT 0.7 07/27/2018 1618   BILITOT 0.5 09/28/2014 1610   GFRNONAA >60 07/27/2018 1618   GFRNONAA >60 09/28/2014 1610   GFRNONAA >60 08/06/2014 1339   GFRAA >60 07/27/2018 1618   GFRAA >60 09/28/2014 1610   GFRAA >60 08/06/2014 1339    Assessment: 1.  Chronic constipation: No longer helped by MiraLAX or Senokot, also hyponatremia for which she is on a decreased fluid intake, likely contributing  Plan: 1.  Prescribed Linzess 145 mcg, one tab qd, provided the patient with samples. 2.  Patient to call our clinic in 1 week if he is not helpful he with with the results of above. 3.  Recommend that the patient discontinue his Senokot and likely MiraLAX as Linzess should help on its own. 4.  Patient to follow in clinic with me in 4 to 6 weeks or sooner if necessary.  Ellouise Newer, PA-C [facility] 08/10/2018, 1:37 PM  Cc: [name], MD

## 2018-08-10 NOTE — Patient Instructions (Signed)
We have given you samples of the following medication to take:  Linzess 145 mg once daily.   Please call the office with an update of your symptoms in the next one week and ask for Koren Shiver, RN, BSN.

## 2018-08-13 ENCOUNTER — Telehealth: Payer: Self-pay | Admitting: Physician Assistant

## 2018-08-13 ENCOUNTER — Ambulatory Visit (INDEPENDENT_AMBULATORY_CARE_PROVIDER_SITE_OTHER): Payer: Medicare PPO

## 2018-08-13 DIAGNOSIS — J4540 Moderate persistent asthma, uncomplicated: Secondary | ICD-10-CM

## 2018-08-13 MED ORDER — OMALIZUMAB 150 MG ~~LOC~~ SOLR
375.0000 mg | Freq: Once | SUBCUTANEOUS | Status: AC
  Administered 2018-08-13: 375 mg via SUBCUTANEOUS

## 2018-08-13 NOTE — Telephone Encounter (Signed)
Yes, please let him try Linzess 36mcg. Thanks-JLL

## 2018-08-13 NOTE — Telephone Encounter (Signed)
The pt will pick up linzess 72 at the front desk at his convenience.

## 2018-08-13 NOTE — Telephone Encounter (Signed)
Anderson Malta do you want the pt to pick up 72 mcg Linzess to try?

## 2018-08-13 NOTE — Telephone Encounter (Signed)
Patient calling back regarding this.  °

## 2018-08-16 ENCOUNTER — Encounter: Payer: Self-pay | Admitting: Urology

## 2018-08-16 ENCOUNTER — Ambulatory Visit: Payer: Self-pay

## 2018-08-16 ENCOUNTER — Ambulatory Visit (INDEPENDENT_AMBULATORY_CARE_PROVIDER_SITE_OTHER): Payer: Medicare PPO | Admitting: Urology

## 2018-08-16 ENCOUNTER — Other Ambulatory Visit: Payer: Self-pay | Admitting: Internal Medicine

## 2018-08-16 VITALS — BP 159/84 | HR 76 | Ht 70.0 in | Wt 191.6 lb

## 2018-08-16 DIAGNOSIS — N319 Neuromuscular dysfunction of bladder, unspecified: Secondary | ICD-10-CM

## 2018-08-16 DIAGNOSIS — N2889 Other specified disorders of kidney and ureter: Secondary | ICD-10-CM

## 2018-08-16 DIAGNOSIS — T839XXD Unspecified complication of genitourinary prosthetic device, implant and graft, subsequent encounter: Secondary | ICD-10-CM

## 2018-08-16 NOTE — Progress Notes (Signed)
Cath Change/ Replacement  Patient is present today for a catheter change due to urinary retention.  107ml of water was removed from the balloon, a 16FR foley cath was removed with out difficulty.  Patient was cleaned and prepped in a sterile fashion with betadine and 2% lidocaine jelly was instilled into the urethra. A 16 FR foley cath was replaced into the bladder no complications were noted Urine return was noted 65ml and urine was amber in color. The balloon was filled with 12ml of sterile water. A leg bag was attached for drainage.  A night bag was also given to the patient and patient was given instruction on how to change from one bag to another. Patient was given proper instruction on catheter care.    Preformed by: Fonnie Jarvis, CMA  Follow up: 1 month w/cysto

## 2018-08-16 NOTE — Progress Notes (Signed)
08/16/2018 3:23 PM   [name] 02-Nov-1934 [id number]  Referring provider: [name], MD [address] [place], [place] [address]  Chief Complaint  Patient presents with  . Follow-up    HPI: Patient is an 84 year old Caucasian male with a history of prostate cancer, incidental 3.1 cm right lower pole renal mass and neurogenic bladder that is managed with an indwelling Foley who presents today to discuss issues he is having with the Foley catheter.  He is having issues with bladder spasms and they have become more frequent and more intense.  He is having some penile pain.  Patient denies any gross hematuria, dysuria or suprapubic/flank pain.  Patient denies any fevers, chills, nausea or vomiting.   He is also having issues with constipation and is currently under a bowel regimen at this time to achieve regular bowel movements.    Contrast CT in the ED on 07/19/2018 revealed adrenal glands are normal. Kidneys normal size and demonstrate a 1.3 cm cyst over the mid pole right kidney.  There is a 3.1 cm enhancing mass over the lower pole right kidney.  No hydronephrosis or nephrolithiasis. Ureters and bladder are within normal. Foley catheter is present within the decompressed bladder.  He is wondering if he can undergo Botox injections into the bladder as he has had success with those in the past.     PMH: Past Medical History:  Diagnosis Date  . Allergy   . Anemia   . Anxiety   . Arthritis    rheumatoid  . Asthma   . CAD (coronary artery disease)   . Cancer [facility])    Prostate  . Chronic sinusitis   . Depression   . Diverticulitis   . ED (erectile dysfunction)   . GERD (gastroesophageal reflux disease)   . History of SIADH   . Hx of adenomatous colonic polyps   . Hypertension   . Hyponatremia   . IBS (irritable bowel syndrome)   . Neurodermatitis   . Neurogenic bladder   . OSA (obstructive sleep apnea)     Surgical History: Past Surgical History:    Procedure Laterality Date  . KNEE ARTHROPLASTY Right 09/02/2015   Procedure: COMPUTER ASSISTED TOTAL KNEE ARTHROPLASTY;  Surgeon: [name], MD;  Location: [facility] ORS;  Service: Orthopedics;  Laterality: Right;  . KNEE SURGERY Left    ARTHROSCOPY LEFT  . NASAL SINUS SURGERY  2009   DEVIATED SEPTUM AND POLYPS  . permanent indwelling catheter    . PROSTATE SURGERY     PROSTATECTOMY  . TOTAL KNEE ARTHROPLASTY Left 9/15   Dr [name]  . URETHRAL STRICTURE DILATATION  02-2010   Dr.Cope    Home Medications:  Allergies as of 08/16/2018      Reactions   Ciprofloxacin Nausea And Vomiting   Headache   Citalopram Hydrobromide Other (See Comments)   "unknown"   Lorazepam    Adverse reaction   Paroxetine Nausea Only   Ramipril Other (See Comments)   "unknown"   Simvastatin    Sulfa Antibiotics Other (See Comments)      Medication List        Accurate as of 08/16/18  3:23 PM. Always use your most recent med list.          albuterol 108 (90 Base) MCG/ACT inhaler Commonly known as:  PROVENTIL HFA;VENTOLIN HFA Inhale 2 puffs into the lungs every 6 (six) hours as needed for wheezing or shortness of breath.   ALIGN  4 MG Caps Take 1 capsule (4 mg total) by mouth daily.   ALPRAZolam 0.5 MG tablet Commonly known as:  XANAX TAKE 1 TABLET BY MOUTH THREE TIMES DAILY AS NEEDED FOR ANXIETY OR SLEEP   clopidogrel 75 MG tablet Commonly known as:  PLAVIX   Co Q 10 100 MG Caps Take 1 capsule by mouth daily.   Cranberry 425 MG Caps Take 2 capsules by mouth daily.   fluticasone 50 MCG/ACT nasal spray Commonly known as:  FLONASE Place 2 sprays into both nostrils 2 (two) times daily.   folic acid 1 MG tablet Commonly known as:  FOLVITE Take 1 tablet (1 mg total) by mouth daily.   gabapentin 300 MG capsule Commonly known as:  NEURONTIN TAKE 1 CAPSULE BY MOUTH THREE TIMES DAILY   hydroxychloroquine 200 MG tablet Commonly known as:  PLAQUENIL Take 1 tablet by mouth 2 (two) times  daily.   ibuprofen 400 MG tablet Commonly known as:  ADVIL,MOTRIN Take by mouth.   methotrexate 2.5 MG tablet Commonly known as:  RHEUMATREX Take 10 tablets by mouth once a week.   Milk Thistle 250 MG Caps Take 1 capsule by mouth daily.   multivitamin tablet Take 1 tablet by mouth daily.   MYRBETRIQ 50 MG Tb24 tablet Generic drug:  mirabegron ER   nystatin powder Commonly known as:  MYCOSTATIN/NYSTOP Apply topically 4 (four) times daily.   omeprazole 20 MG capsule Commonly known as:  PRILOSEC Take 1 capsule (20 mg total) by mouth daily as needed.   polyethylene glycol packet Commonly known as:  MIRALAX / GLYCOLAX Take 34 g by mouth daily.   pravastatin 20 MG tablet Commonly known as:  PRAVACHOL TAKE 1 TABLET BY MOUTH EVERY DAY   Selenium 200 MCG Caps Take 1 capsule by mouth daily.   Turmeric 500 MG Caps Take 500 mg by mouth daily.   vitamin B-12 500 MCG tablet Commonly known as:  CYANOCOBALAMIN Take 500 mcg by mouth daily.   Vitamin D3 5000 units Caps Take 1 capsule by mouth daily.       Allergies:  Allergies  Allergen Reactions  . Ciprofloxacin Nausea And Vomiting    Headache  . Citalopram Hydrobromide Other (See Comments)    "unknown"  . Lorazepam     Adverse reaction  . Paroxetine Nausea Only  . Ramipril Other (See Comments)    "unknown"  . Simvastatin   . Sulfa Antibiotics Other (See Comments)    Family History: Family History  Problem Relation Age of Onset  . Heart disease Mother 9       heart failure  . Pneumonia Father 63       pnemonia  . Skin cancer Father   . Skin cancer Sister   . Skin cancer Son   . Colon cancer Neg Hx     Social History:  reports that he has never smoked. He has never used smokeless tobacco. He reports that he drinks alcohol. He reports that he does not use drugs.  ROS: UROLOGY Frequent Urination?: No Hard to postpone urination?: No Burning/pain with urination?: No Get up at night to urinate?:  No Leakage of urine?: No Urine stream starts and stops?: No Trouble starting stream?: No Do you have to strain to urinate?: No Blood in urine?: No Urinary tract infection?: No Sexually transmitted disease?: No Injury to kidneys or bladder?: No Painful intercourse?: No Weak stream?: No Erection problems?: No Penile pain?: Yes  Gastrointestinal Nausea?: No Vomiting?: No Indigestion/heartburn?: No  Diarrhea?: No Constipation?: Yes  Constitutional Fever: No Night sweats?: No Weight loss?: Yes Fatigue?: Yes  Skin Skin rash/lesions?: No Itching?: Yes  Eyes Blurred vision?: No Double vision?: No  Ears/Nose/Throat Sore throat?: No Sinus problems?: No  Hematologic/Lymphatic Swollen glands?: No Easy bruising?: Yes  Cardiovascular Leg swelling?: No Chest pain?: No  Respiratory Cough?: No Shortness of breath?: No  Endocrine Excessive thirst?: No  Musculoskeletal Back pain?: Yes Joint pain?: Yes  Neurological Headaches?: No Dizziness?: No  Psychologic Depression?: No Anxiety?: Yes  Physical Exam: BP (!) 159/84 (BP Location: Right Arm, Patient Position: Sitting, Cuff Size: Normal)   Pulse 76   Ht 5\' 10"  (1.778 m)   Wt 191 lb 9.6 oz (86.9 kg)   BMI 27.49 kg/m   Constitutional:  Well nourished. Alert and oriented, No acute distress. HEENT: [place] AT, moist mucus membranes.  Trachea midline, no masses. Cardiovascular: No clubbing, cyanosis, or edema. Respiratory: Normal respiratory effort, no increased work of breathing. Skin: No rashes, bruises or suspicious lesions. Lymph: No cervical or inguinal adenopathy. Neurologic: Grossly intact, no focal deficits, moving all 4 extremities. Psychiatric: Normal mood and affect.  Laboratory Data: Lab Results  Component Value Date   WBC 5.8 07/27/2018   HGB 13.4 07/27/2018   HCT 37.2 (L) 07/27/2018   MCV 92.4 07/27/2018   PLT 207 07/27/2018    Lab Results  Component Value Date   CREATININE 0.72 07/31/2018     Lab Results  Component Value Date   PSA 0.00 (L) 04/27/2017   PSA 0.01 (L) 05/18/2016   PSA 0.01 (L) 01/26/2015    No results found for: TESTOSTERONE  Lab Results  Component Value Date   HGBA1C 5.1 05/26/2018    Lab Results  Component Value Date   TSH 3.598 04/03/2017       Component Value Date/Time   CHOL 114 05/26/2018 0411   HDL 43 05/26/2018 0411   CHOLHDL 2.7 05/26/2018 0411   VLDL 10 05/26/2018 0411   LDLCALC 61 05/26/2018 0411    Lab Results  Component Value Date   AST 23 07/27/2018   Lab Results  Component Value Date   ALT 16 07/27/2018   No components found for: ALKALINEPHOPHATASE No components found for: BILIRUBINTOTAL  No results found for: ESTRADIOL  Urinalysis    Component Value Date/Time   COLORURINE YELLOW (A) 07/19/2018 1740   APPEARANCEUR CLOUDY (A) 07/19/2018 1740   APPEARANCEUR CLOUDY 09/28/2014 1610   LABSPEC 1.001 (L) 07/19/2018 1740   LABSPEC 1.025 09/28/2014 1610   PHURINE 8.0 07/19/2018 1740   GLUCOSEU NEGATIVE 07/19/2018 1740   GLUCOSEU see comment 09/28/2014 1610   HGBUR MODERATE (A) 07/19/2018 1740   HGBUR trace-lysed 02/22/2010 1536   BILIRUBINUR NEGATIVE 07/19/2018 1740   BILIRUBINUR see comment 09/28/2014 1610   KETONESUR NEGATIVE 07/19/2018 1740   PROTEINUR NEGATIVE 07/19/2018 1740   UROBILINOGEN 0.2 02/22/2010 1536   NITRITE NEGATIVE 07/19/2018 1740   LEUKOCYTESUR NEGATIVE 07/19/2018 1740   LEUKOCYTESUR see comment 09/28/2014 1610    I have reviewed the labs.  Assessment & Plan:    1. Neurogenic bladder Patient is currently on Myrbetriq 50 mg daily and feels they are not controlling his bladder spasms adequately He is a former patient of Dr. [name] and felt that the Botox that were administered by hand more effective in relieving his bladder spasms and would like to see one of our physician regarding his appropriateness for Botox at this time He is also due for cystoscopy at this time  as he has an indwelling  Foley in the bladder has not been evaluated in over a year for any mucosal changes concerning for squamous cell carcinoma  2. Right renal mass Return in 11/2018 with repeat imaging and office visit with Dr. Erlene Quan  3. Problems with Foley Foley is exchanged today and he will return for cystoscopy and Foley exchange in 1 month  Return for cystoscopy with Dr. [name] .  These notes generated with voice recognition software. I apologize for typographical errors.  Zara Council, PA-C  [facility] [address]  [place], [place] [address] [phone]

## 2018-08-20 DIAGNOSIS — E871 Hypo-osmolality and hyponatremia: Secondary | ICD-10-CM | POA: Diagnosis not present

## 2018-08-22 ENCOUNTER — Telehealth: Payer: Self-pay | Admitting: Pulmonary Disease

## 2018-08-22 DIAGNOSIS — N3281 Overactive bladder: Secondary | ICD-10-CM | POA: Diagnosis not present

## 2018-08-22 DIAGNOSIS — N319 Neuromuscular dysfunction of bladder, unspecified: Secondary | ICD-10-CM | POA: Diagnosis not present

## 2018-08-22 DIAGNOSIS — K219 Gastro-esophageal reflux disease without esophagitis: Secondary | ICD-10-CM | POA: Diagnosis not present

## 2018-08-22 DIAGNOSIS — E785 Hyperlipidemia, unspecified: Secondary | ICD-10-CM | POA: Diagnosis not present

## 2018-08-22 DIAGNOSIS — Z7901 Long term (current) use of anticoagulants: Secondary | ICD-10-CM | POA: Diagnosis not present

## 2018-08-22 DIAGNOSIS — Z8673 Personal history of transient ischemic attack (TIA), and cerebral infarction without residual deficits: Secondary | ICD-10-CM | POA: Diagnosis not present

## 2018-08-22 DIAGNOSIS — J45909 Unspecified asthma, uncomplicated: Secondary | ICD-10-CM | POA: Diagnosis not present

## 2018-08-22 DIAGNOSIS — M069 Rheumatoid arthritis, unspecified: Secondary | ICD-10-CM | POA: Diagnosis not present

## 2018-08-22 NOTE — Telephone Encounter (Signed)
Prefilled Syringe: #150mg  4  #75mg  2 Ordered Date: 08/22/18 Shipping Date: 08/22/18

## 2018-08-23 NOTE — Telephone Encounter (Signed)
Prefilled Syringes: # 150mg  4  #75mg  2 Arrival Date: 08/23/18 Lot #: 150mg  [id number]      75mg  [id number] Exp Date: 150mg  01/2019   75mg  01/2019

## 2018-08-24 DIAGNOSIS — H2513 Age-related nuclear cataract, bilateral: Secondary | ICD-10-CM | POA: Diagnosis not present

## 2018-08-27 ENCOUNTER — Ambulatory Visit (INDEPENDENT_AMBULATORY_CARE_PROVIDER_SITE_OTHER): Payer: Medicare PPO | Admitting: Psychology

## 2018-08-27 DIAGNOSIS — F418 Other specified anxiety disorders: Secondary | ICD-10-CM | POA: Diagnosis not present

## 2018-08-30 ENCOUNTER — Ambulatory Visit: Payer: Self-pay

## 2018-08-31 ENCOUNTER — Ambulatory Visit (INDEPENDENT_AMBULATORY_CARE_PROVIDER_SITE_OTHER): Payer: Medicare PPO

## 2018-08-31 DIAGNOSIS — J4540 Moderate persistent asthma, uncomplicated: Secondary | ICD-10-CM | POA: Diagnosis not present

## 2018-08-31 MED ORDER — OMALIZUMAB 150 MG ~~LOC~~ SOLR
375.0000 mg | SUBCUTANEOUS | Status: DC
  Administered 2018-08-31 – 2018-09-21 (×2): 375 mg via SUBCUTANEOUS

## 2018-08-31 NOTE — Progress Notes (Signed)
Documentation of medication administration and charges of Xolair have been completed by Dorion Petillo, CMA based on the hand written Xolair documentation sheet completed by Tammy Scott, who administered the medication.  

## 2018-09-03 ENCOUNTER — Ambulatory Visit (INDEPENDENT_AMBULATORY_CARE_PROVIDER_SITE_OTHER): Payer: Medicare PPO | Admitting: Internal Medicine

## 2018-09-03 ENCOUNTER — Encounter: Payer: Self-pay | Admitting: Internal Medicine

## 2018-09-03 VITALS — BP 114/74 | HR 72 | Temp 98.0°F | Ht 70.0 in | Wt 192.2 lb

## 2018-09-03 DIAGNOSIS — Z7189 Other specified counseling: Secondary | ICD-10-CM | POA: Diagnosis not present

## 2018-09-03 DIAGNOSIS — Z23 Encounter for immunization: Secondary | ICD-10-CM | POA: Diagnosis not present

## 2018-09-03 DIAGNOSIS — I10 Essential (primary) hypertension: Secondary | ICD-10-CM

## 2018-09-03 DIAGNOSIS — J4550 Severe persistent asthma, uncomplicated: Secondary | ICD-10-CM

## 2018-09-03 DIAGNOSIS — E222 Syndrome of inappropriate secretion of antidiuretic hormone: Secondary | ICD-10-CM

## 2018-09-03 DIAGNOSIS — Z0000 Encounter for general adult medical examination without abnormal findings: Secondary | ICD-10-CM

## 2018-09-03 DIAGNOSIS — F39 Unspecified mood [affective] disorder: Secondary | ICD-10-CM | POA: Diagnosis not present

## 2018-09-03 DIAGNOSIS — M05741 Rheumatoid arthritis with rheumatoid factor of right hand without organ or systems involvement: Secondary | ICD-10-CM | POA: Diagnosis not present

## 2018-09-03 DIAGNOSIS — M05742 Rheumatoid arthritis with rheumatoid factor of left hand without organ or systems involvement: Secondary | ICD-10-CM | POA: Diagnosis not present

## 2018-09-03 MED ORDER — LINACLOTIDE 72 MCG PO CAPS: 72.0000 ug | ORAL_CAPSULE | Freq: Every day | ORAL | 0 refills | Status: DC

## 2018-09-03 NOTE — Addendum Note (Signed)
Addended by: Jacqualin Combes on: 09/03/2018 03:47 PM   Modules accepted: Orders

## 2018-09-03 NOTE — Progress Notes (Signed)
Subjective:    Patient ID: [name], male    DOB: 05-Mar-1934, 84 y.o.   MRN: [id number]  HPI Here with wife for Medicare wellness visit and follow up of chronic health conditions Reviewed form and advanced directives Reviewed other doctors Up to 1 drink per day No tobacco Tries to do some exercise Vision is okay Has hearing aides [address] once---still walks with walker. No injury Doesn't drive. No instrumental ADLs but is independent with ADLs No sig memory problems  Did see Dr Holley Raring about 2-3 weeks ago On the salt tablets and fluid restriction (1200cc --- hard to do)  Went to urology Told he was due for cystoscopy--due to chronic indwelling catheter Not too happy with that visit  Ongoing bowel problems--despite miralax and senna Continues with GI Started on linzess on 145mg ---- caused fecal incontinence Now on low dose---inconsistent results Has sensation of "not being finished" with a "pressure"  Asthma is controlled Gets his injections every 15 days---xolair  Anxiety persists Some depression as well He actually feels better with 1/2-1 excedrin---actually improved his mood  No chest pain No dizziness or syncope No sig edema  Continues on hydroxychloroquine and MTX for his RA Continues with Dr Girtha Rm to do walking or exercise bike daily  Current Outpatient Medications on File Prior to Visit  Medication Sig Dispense Refill  . ALPRAZolam (XANAX) 0.5 MG tablet TAKE 1 TABLET BY MOUTH THREE TIMES DAILY AS NEEDED FOR ANXIETY OR SLEEP 90 tablet 0  . Cholecalciferol (VITAMIN D3) 5000 UNITS CAPS Take 1 capsule by mouth daily.    . clopidogrel (PLAVIX) 75 MG tablet   4  . Coenzyme Q10 (CO Q 10) 100 MG CAPS Take 1 capsule by mouth daily.    . Cranberry 425 MG CAPS Take 2 capsules by mouth daily.    . fluticasone (FLONASE) 50 MCG/ACT nasal spray Place 2 sprays into both nostrils 2 (two) times daily. 48 g 3  . folic acid (FOLVITE) 1 MG tablet Take 1 tablet (1 mg  total) by mouth daily. 90 tablet 3  . gabapentin (NEURONTIN) 300 MG capsule TAKE 1 CAPSULE BY MOUTH THREE TIMES DAILY 90 capsule 11  . hydroxychloroquine (PLAQUENIL) 200 MG tablet Take 1 tablet by mouth 2 (two) times daily.    Marland Kitchen ibuprofen (ADVIL,MOTRIN) 400 MG tablet Take by mouth.    . linaclotide (LINZESS) 72 MCG capsule Take 72 mcg by mouth daily before breakfast.    . methotrexate (RHEUMATREX) 2.5 MG tablet Take 10 tablets by mouth once a week.    . Milk Thistle 250 MG CAPS Take 1 capsule by mouth daily.    . Multiple Vitamin (MULTIVITAMIN) tablet Take 1 tablet by mouth daily.      Marland Kitchen MYRBETRIQ 50 MG TB24 tablet   11  . nystatin (MYCOSTATIN/NYSTOP) powder Apply topically 4 (four) times daily. 15 g 2  . omeprazole (PRILOSEC) 20 MG capsule TAKE 1 CAPSULE(20 MG) BY MOUTH DAILY AS NEEDED 90 capsule 3  . polyethylene glycol (GLYCOLAX) packet Take 34 g by mouth daily.     . pravastatin (PRAVACHOL) 20 MG tablet TAKE 1 TABLET BY MOUTH EVERY DAY 90 tablet 3  . Probiotic Product (ALIGN) 4 MG CAPS Take 1 capsule (4 mg total) by mouth daily. 15 capsule 0  . Selenium 200 MCG CAPS Take 1 capsule by mouth daily.    . Turmeric 500 MG CAPS Take 500 mg by mouth daily.    . vitamin B-12 (CYANOCOBALAMIN) 500 MCG tablet  Take 500 mcg by mouth daily.    Marland Kitchen albuterol (PROVENTIL HFA;VENTOLIN HFA) 108 (90 Base) MCG/ACT inhaler Inhale 2 puffs into the lungs every 6 (six) hours as needed for wheezing or shortness of breath. (Patient not taking: Reported on 08/16/2018) 1 Inhaler 3   Current Facility-Administered Medications on File Prior to Visit  Medication Dose Route Frequency Provider Last Rate Last Dose  . omalizumab Arvid Right) injection 375 mg  375 mg Subcutaneous Q14 Days Chesley Mires, MD   375 mg at 08/31/18 1556    Allergies  Allergen Reactions  . Ciprofloxacin Nausea And Vomiting    Headache  . Citalopram Hydrobromide Other (See Comments)    "unknown"  . Lorazepam     Adverse reaction  . Paroxetine Nausea  Only  . Ramipril Other (See Comments)    "unknown"  . Simvastatin   . Sulfa Antibiotics Other (See Comments)    Past Medical History:  Diagnosis Date  . Allergy   . Anemia   . Anxiety   . Arthritis    rheumatoid  . Asthma   . CAD (coronary artery disease)   . Cancer [facility])    Prostate  . Chronic sinusitis   . Depression   . Diverticulitis   . ED (erectile dysfunction)   . GERD (gastroesophageal reflux disease)   . History of SIADH   . Hx of adenomatous colonic polyps   . Hypertension   . Hyponatremia   . IBS (irritable bowel syndrome)   . Neurodermatitis   . Neurogenic bladder   . OSA (obstructive sleep apnea)     Past Surgical History:  Procedure Laterality Date  . KNEE ARTHROPLASTY Right 09/02/2015   Procedure: COMPUTER ASSISTED TOTAL KNEE ARTHROPLASTY;  Surgeon: [name], MD;  Location: [facility] ORS;  Service: Orthopedics;  Laterality: Right;  . KNEE SURGERY Left    ARTHROSCOPY LEFT  . NASAL SINUS SURGERY  2009   DEVIATED SEPTUM AND POLYPS  . permanent indwelling catheter    . PROSTATE SURGERY     PROSTATECTOMY  . TOTAL KNEE ARTHROPLASTY Left 9/15   Dr [name]  . URETHRAL STRICTURE DILATATION  02-2010   Dr.Cope    Family History  Problem Relation Age of Onset  . Heart disease Mother 41       heart failure  . Pneumonia Father 77       pnemonia  . Skin cancer Father   . Skin cancer Sister   . Skin cancer Son   . Colon cancer Neg Hx     Social History   Socioeconomic History  . Marital status: Married    Spouse name: Not on file  . Number of children: 2  . Years of education: Not on file  . Highest education level: Not on file  Occupational History  . 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 Financial resource strain: Not on file  . Food insecurity:    Worry: Not on file    Inability: Not on file  . Transportation needs:    Medical: Not on file    Non-medical: Not on file  Tobacco Use  . Smoking status: Never  Smoker  . Smokeless tobacco: Never Used  Substance and Sexual Activity  . Alcohol use: Yes    Comment: 5 days out of 7 drinks gin or wine interchangeably   . Drug use: No  . Sexual activity: Not Currently  Lifestyle  . Physical activity:    Days  per week: Not on file    Minutes per session: Not on file  . Stress: Not on file  Relationships  . Social connections:    Talks on phone: Not on file    Gets together: Not on file    Attends religious service: Not on file    Active member of club or organization: Not on file    Attends meetings of clubs or organizations: Not on file    Relationship status: Not on file  . Intimate partner violence:    Fear of current or ex partner: Not on file    Emotionally abused: Not on file    Physically abused: Not on file    Forced sexual activity: Not on file  Other Topics Concern  . Not on file  Social History Narrative   Not sure about a living will or health care POA   Wife should make health care decisions for him--then daughter, then son   Would accept resuscitation attempts   Not sure about tube feeds   Review of Systems Appetite is okay Maintaining his lower weight Sleep is not great---relates to bladder pain Some vague pain in left abdomen---can feels some "bumps" under his skin Wears seat belt Teeth okay--- keeps up with dentist     Objective:   Physical Exam  Constitutional: He is oriented to person, place, and time. He appears well-developed. No distress.  HENT:  Mouth/Throat: Oropharynx is clear and moist. No oropharyngeal exudate.  Neck: No thyromegaly present.  Cardiovascular: Normal rate, regular rhythm, normal heart sounds and intact distal pulses. Exam reveals no gallop.  No murmur heard. Respiratory: Effort normal and breath sounds normal. No respiratory distress. He has no wheezes. He has no rales.  GI: Soft. There is no tenderness.  Musculoskeletal: He exhibits no edema.  Toe deviations  Dependent rubor    Lymphadenopathy:    He has no cervical adenopathy.  Neurological: He is alert and oriented to person, place, and time.  President--- "Megan Salon, Obama, Bush [id number] Recall 3/3  Skin: No rash noted.  Psychiatric: He has a normal mood and affect. His behavior is normal.           Assessment & Plan:

## 2018-09-03 NOTE — Assessment & Plan Note (Signed)
Chronic anxiety and dysthymia Strangely he feels excedrin helps---okay to take 1/2-1 daily

## 2018-09-03 NOTE — Assessment & Plan Note (Signed)
See social history 

## 2018-09-03 NOTE — Assessment & Plan Note (Signed)
On salt, lasix and fluid restriction Occasionally skips the lasix Will recheck labs today

## 2018-09-03 NOTE — Assessment & Plan Note (Signed)
On plaquenil and MTX Ongoing joint pain Toe deviation No marked synovial tenderness

## 2018-09-03 NOTE — Assessment & Plan Note (Signed)
Doing well on the [employer]

## 2018-09-03 NOTE — Assessment & Plan Note (Signed)
I have personally reviewed the Medicare Annual Wellness questionnaire and have noted 1. The patient's medical and social history 2. Their use of alcohol, tobacco or illicit drugs 3. Their current medications and supplements 4. The patient's functional ability including ADL's, fall risks, home safety risks and hearing or visual             impairment. 5. Diet and physical activities 6. Evidence for depression or mood disorders  The patients weight, height, BMI and visual acuity have been recorded in the chart I have made referrals, counseling and provided education to the patient based review of the above and I have provided the pt with a written personalized care plan for preventive services.  I have provided you with a copy of your personalized plan for preventive services. Please take the time to review along with your updated medication list.  Done with cancer screening due to age Flu vaccine today Working on [employer] he can handle

## 2018-09-03 NOTE — Assessment & Plan Note (Signed)
BP Readings from Last 3 Encounters:  09/03/18 114/74  08/16/18 (!) 159/84  08/10/18 108/64   Good control

## 2018-09-04 LAB — RENAL FUNCTION PANEL
Albumin: 3.9 g/dL (ref 3.5–5.2)
BUN: 12 mg/dL (ref 6–23)
CALCIUM: 9 mg/dL (ref 8.4–10.5)
CO2: 32 meq/L (ref 19–32)
Chloride: 95 mEq/L — ABNORMAL LOW (ref 96–112)
Creatinine, Ser: 0.66 mg/dL (ref 0.40–1.50)
GFR: 122.73 mL/min (ref >60.00)
GLUCOSE: 92 mg/dL (ref 70–99)
PHOSPHORUS: 3.6 mg/dL (ref 2.3–4.6)
Potassium: 4.9 mEq/L (ref 3.5–5.1)
Sodium: 131 mEq/L — ABNORMAL LOW (ref 135–145)

## 2018-09-10 ENCOUNTER — Ambulatory Visit: Payer: Medicare PPO | Admitting: Urology

## 2018-09-10 ENCOUNTER — Encounter: Payer: Self-pay | Admitting: Urology

## 2018-09-10 VITALS — BP 125/72 | HR 86 | Ht 70.0 in | Wt 192.0 lb

## 2018-09-10 DIAGNOSIS — R339 Retention of urine, unspecified: Secondary | ICD-10-CM

## 2018-09-10 NOTE — Progress Notes (Signed)
09/10/2018 3:19 PM   [name] [birth year] [id number]  Referring provider: [name], MD [address] [place], [place] [address]  Chief Complaint  Patient presents with  . Cysto    HPI: Dr Erlene Quan: Recently saw patient with 3 cm right lower pole renal mass.  Patient has prostate cancer post radical prostatectomy and salvage radiation.  He had developed a small capacity bladder and had been managed with a Foley catheter.  He was then followed by Dr. Bernardo Heater.  He had a lengthy discussion regarding his Foley catheter.  He was going to be followed up in December with a repeat CT scan  Complete bladder emptying stable.  Medically not infected.  Patient underwent cystoscopy for bladder surveillance with chronic Foley catheter since 2012.  Penile bulbar urethra normal.  Bladder neck was open.  He had grade 1 or 2 trabeculation.  There was erythema at the floor of the bladder and the position with a Foley catheter balloon would be.  There was no papillary lesions.  There is no obvious radiation changes or is such that they were mild he has some discomfort with bladder filling.  Overall he tolerated the procedure well   PMH: Past Medical History:  Diagnosis Date  . Allergy   . Anemia   . Anxiety   . Arthritis    rheumatoid  . Asthma   . CAD (coronary artery disease)   . Cancer [facility])    Prostate  . Chronic sinusitis   . Depression   . Diverticulitis   . ED (erectile dysfunction)   . GERD (gastroesophageal reflux disease)   . History of SIADH   . Hx of adenomatous colonic polyps   . Hypertension   . Hyponatremia   . IBS (irritable bowel syndrome)   . Neurodermatitis   . Neurogenic bladder   . OSA (obstructive sleep apnea)     Surgical History: Past Surgical History:  Procedure Laterality Date  . KNEE ARTHROPLASTY Right 09/02/2015   Procedure: COMPUTER ASSISTED TOTAL KNEE ARTHROPLASTY;  Surgeon: [name], MD;  Location: [facility] ORS;  Service: Orthopedics;   Laterality: Right;  . KNEE SURGERY Left    ARTHROSCOPY LEFT  . NASAL SINUS SURGERY  2009   DEVIATED SEPTUM AND POLYPS  . permanent indwelling catheter    . PROSTATE SURGERY     PROSTATECTOMY  . TOTAL KNEE ARTHROPLASTY Left 9/15   Dr [name]  . URETHRAL STRICTURE DILATATION  02-2010   Dr.Cope    Home Medications:  Allergies as of 09/10/2018      Reactions   Ciprofloxacin Nausea And Vomiting   Headache   Citalopram Hydrobromide Other (See Comments)   "unknown"   Lorazepam    Adverse reaction   Paroxetine Nausea Only   Ramipril Other (See Comments)   "unknown"   Simvastatin    Sulfa Antibiotics Other (See Comments)      Medication List        Accurate as of 09/10/18  3:19 PM. Always use your most recent med list.          albuterol 108 (90 Base) MCG/ACT inhaler Commonly known as:  PROVENTIL HFA;VENTOLIN HFA Inhale 2 puffs into the lungs every 6 (six) hours as needed for wheezing or shortness of breath.   ALIGN 4 MG Caps Take 1 capsule (4 mg total) by mouth daily.   ALPRAZolam 0.5 MG tablet Commonly known as:  XANAX TAKE 1 TABLET BY MOUTH THREE TIMES DAILY AS NEEDED  FOR ANXIETY OR SLEEP   clopidogrel 75 MG tablet Commonly known as:  PLAVIX   Co Q 10 100 MG Caps Take 1 capsule by mouth daily.   Cranberry 425 MG Caps Take 2 capsules by mouth daily.   fluticasone 50 MCG/ACT nasal spray Commonly known as:  FLONASE Place 2 sprays into both nostrils 2 (two) times daily.   folic acid 1 MG tablet Commonly known as:  FOLVITE Take 1 tablet (1 mg total) by mouth daily.   gabapentin 300 MG capsule Commonly known as:  NEURONTIN TAKE 1 CAPSULE BY MOUTH THREE TIMES DAILY   hydroxychloroquine 200 MG tablet Commonly known as:  PLAQUENIL Take 1 tablet by mouth 2 (two) times daily.   ibuprofen 400 MG tablet Commonly known as:  ADVIL,MOTRIN Take by mouth.   linaclotide 72 MCG capsule Commonly known as:  LINZESS Take 1 capsule (72 mcg total) by mouth daily before  breakfast.   methotrexate 2.5 MG tablet Commonly known as:  RHEUMATREX Take 10 tablets by mouth once a week.   Milk Thistle 250 MG Caps Take 1 capsule by mouth daily.   multivitamin tablet Take 1 tablet by mouth daily.   MYRBETRIQ 50 MG Tb24 tablet Generic drug:  mirabegron ER   nystatin powder Commonly known as:  MYCOSTATIN/NYSTOP Apply topically 4 (four) times daily.   omeprazole 20 MG capsule Commonly known as:  PRILOSEC TAKE 1 CAPSULE(20 MG) BY MOUTH DAILY AS NEEDED   polyethylene glycol packet Commonly known as:  MIRALAX / GLYCOLAX Take 34 g by mouth daily.   pravastatin 20 MG tablet Commonly known as:  PRAVACHOL TAKE 1 TABLET BY MOUTH EVERY DAY   Selenium 200 MCG Caps Take 1 capsule by mouth daily.   Turmeric 500 MG Caps Take 500 mg by mouth daily.   vitamin B-12 500 MCG tablet Commonly known as:  CYANOCOBALAMIN Take 500 mcg by mouth daily.   Vitamin D3 5000 units Caps Take 1 capsule by mouth daily.       Allergies:  Allergies  Allergen Reactions  . Ciprofloxacin Nausea And Vomiting    Headache  . Citalopram Hydrobromide Other (See Comments)    "unknown"  . Lorazepam     Adverse reaction  . Paroxetine Nausea Only  . Ramipril Other (See Comments)    "unknown"  . Simvastatin   . Sulfa Antibiotics Other (See Comments)    Family History: Family History  Problem Relation Age of Onset  . Heart disease Mother 2       heart failure  . Pneumonia Father 59       pnemonia  . Skin cancer Father   . Skin cancer Sister   . Skin cancer Son   . Colon cancer Neg Hx     Social History:  reports that he has never smoked. He has never used smokeless tobacco. He reports that he drinks alcohol. He reports that he does not use drugs.  ROS:                                        Physical Exam: BP 125/72   Pulse 86   Ht 5\' 10"  (1.778 m)   Wt 192 lb (87.1 kg)   BMI 27.55 kg/m   Constitutional:  Alert and oriented, No acute  distress.  Laboratory Data: Lab Results  Component Value Date   WBC 5.8 07/27/2018   HGB  13.4 07/27/2018   HCT 37.2 (L) 07/27/2018   MCV 92.4 07/27/2018   PLT 207 07/27/2018    Lab Results  Component Value Date   CREATININE 0.66 09/03/2018    Lab Results  Component Value Date   PSA 0.00 (L) 04/27/2017   PSA 0.01 (L) 05/18/2016   PSA 0.01 (L) 01/26/2015    No results found for: TESTOSTERONE  Lab Results  Component Value Date   HGBA1C 5.1 05/26/2018    Urinalysis    Component Value Date/Time   COLORURINE YELLOW (A) 07/19/2018 1740   APPEARANCEUR CLOUDY (A) 07/19/2018 1740   APPEARANCEUR CLOUDY 09/28/2014 1610   LABSPEC 1.001 (L) 07/19/2018 1740   LABSPEC 1.025 09/28/2014 1610   PHURINE 8.0 07/19/2018 1740   GLUCOSEU NEGATIVE 07/19/2018 1740   GLUCOSEU see comment 09/28/2014 1610   HGBUR MODERATE (A) 07/19/2018 1740   HGBUR trace-lysed 02/22/2010 1536   BILIRUBINUR NEGATIVE 07/19/2018 1740   BILIRUBINUR see comment 09/28/2014 1610   KETONESUR NEGATIVE 07/19/2018 1740   PROTEINUR NEGATIVE 07/19/2018 1740   UROBILINOGEN 0.2 02/22/2010 1536   NITRITE NEGATIVE 07/19/2018 1740   LEUKOCYTESUR NEGATIVE 07/19/2018 1740   LEUKOCYTESUR see comment 09/28/2014 1610    Pertinent Imaging:   Assessment & Plan: Reassurance given.  Follow-up with CT scan as scheduled.  In my opinion the patient does not need Botox and tends to concentrate a lot on the catheter.  Urine sent for culture recognizing it may be colonized.  Urine culture +July 03, 2018  There are no diagnoses linked to this encounter.  No follow-ups on file.  Reece Packer, MD  [facility] [address] [place], [place] [address] [phone]

## 2018-09-11 DIAGNOSIS — M0579 Rheumatoid arthritis with rheumatoid factor of multiple sites without organ or systems involvement: Secondary | ICD-10-CM | POA: Diagnosis not present

## 2018-09-11 DIAGNOSIS — Z79899 Other long term (current) drug therapy: Secondary | ICD-10-CM | POA: Diagnosis not present

## 2018-09-12 DIAGNOSIS — R339 Retention of urine, unspecified: Secondary | ICD-10-CM | POA: Diagnosis not present

## 2018-09-12 DIAGNOSIS — N319 Neuromuscular dysfunction of bladder, unspecified: Secondary | ICD-10-CM | POA: Diagnosis not present

## 2018-09-12 LAB — CULTURE, URINE COMPREHENSIVE

## 2018-09-16 ENCOUNTER — Other Ambulatory Visit: Payer: Self-pay | Admitting: Internal Medicine

## 2018-09-17 ENCOUNTER — Ambulatory Visit: Payer: Self-pay

## 2018-09-17 NOTE — Telephone Encounter (Signed)
Last filled 08-06-18 #90 Last OV 09-03-18 Next OV 03-07-19

## 2018-09-20 ENCOUNTER — Ambulatory Visit (INDEPENDENT_AMBULATORY_CARE_PROVIDER_SITE_OTHER): Payer: Medicare PPO

## 2018-09-20 DIAGNOSIS — J4540 Moderate persistent asthma, uncomplicated: Secondary | ICD-10-CM | POA: Diagnosis not present

## 2018-09-21 DIAGNOSIS — J4540 Moderate persistent asthma, uncomplicated: Secondary | ICD-10-CM | POA: Diagnosis not present

## 2018-09-21 NOTE — Progress Notes (Signed)
Documented by Jessica Jones CMA based on hand-written Xolair documentation sheet completed by Tammy Scott CMA, who administered the medication.  

## 2018-09-25 ENCOUNTER — Encounter: Payer: Self-pay | Admitting: Gastroenterology

## 2018-09-25 ENCOUNTER — Ambulatory Visit: Payer: Medicare PPO | Admitting: Gastroenterology

## 2018-09-25 ENCOUNTER — Telehealth: Payer: Self-pay | Admitting: Pulmonary Disease

## 2018-09-25 VITALS — BP 130/62 | HR 80 | Ht 70.0 in | Wt 194.6 lb

## 2018-09-25 DIAGNOSIS — K5904 Chronic idiopathic constipation: Secondary | ICD-10-CM | POA: Diagnosis not present

## 2018-09-25 MED ORDER — PLECANATIDE 3 MG PO TABS: 1.0000 | ORAL_TABLET | Freq: Every day | ORAL | 0 refills | Status: DC

## 2018-09-25 NOTE — Patient Instructions (Signed)
Start Trulance samples one tablet by mouth daily. If this medication helps please contact our office and we will send a prescription to your pharmacy.   If this medication does not help then purchase over the counter Miralax mixing 17 grams in 8 oz of water 1-3 x daily to titrate for target goal to have 2 bowel movements daily.   Thank you for choosing me and [facility].  Pricilla Riffle. Dagoberto Ligas., MD., Marval Regal

## 2018-09-25 NOTE — Progress Notes (Signed)
    History of Present Illness: This is an 84 year old male with chronic constipation who was recently evaluated by Ellouise Newer, PA-C and started on Linzess 145 mcg that lead to diarrhea then changed to Linzess 72 mcg.  He has not been satisfied with Linzess as it also causes loose stools on occasion as well.  He has multiple concerns about his bowel movements: the frequency, the consistency, the timing, the difficulty in finding a constipation regimen that works the same every day.  He notes occasional crampy left-sided pain that generally seems to be associated with constipation that has been present for many years.   Current Medications, Allergies, Past Medical History, Past Surgical History, Family History and Social History were reviewed in [employer] record.   Physical Exam: General: Well developed, well nourished, elderly, uses a walker, no acute distress Head: Normocephalic and atraumatic Eyes:  sclerae anicteric, EOMI Ears: Normal auditory acuity Mouth: No deformity or lesions Lungs: Clear throughout to auscultation Heart: Regular rate and rhythm; no murmurs, rubs or bruits Abdomen: Soft, non tender and non distended. No masses, hepatosplenomegaly or hernias noted. Normal Bowel sounds Rectal: Not done Musculoskeletal: Symmetrical with no gross deformities  Pulses:  Normal pulses noted Extremities: No clubbing, cyanosis, edema or deformities noted Neurological: Alert oriented x 4, grossly nonfocal Psychological:  Alert and cooperative. Anxious   Assessment and Recommendations:  1.  Chronic idiopathic constipation.  Diarrhea side effects even at the lowest dose of Linzess are bothersome for him.  His anxiety is contributing to his concern and high level of focus about his bowel habits and he recognizes this. His primary physician can further address with him.  We reviewed the results of his colonoscopy performed in September 2017 showing only moderate  left colon diverticulosis.  I addressed his questions to his satisfaction.  We discussed expected outcomes of managing CIC that typically will not lead to perfection but should improve his current status.  We discussed increasing his daily fiber intake and maintaining adequate daily water intake.  DC Linzess. Truance 3 mg daily for 1 to 2 weeks.  If this is effective for him and affordable we will maintain it.  If this is not effective or not affordable he will restart MiraLAX titrated between 1 and 3 doses per day to achieve 1-2 complete bowel movements per day.    I spent 25 minutes of face-to-face time with the patient. Greater than 50% of the time was spent counseling and coordinating care.

## 2018-09-26 ENCOUNTER — Telehealth: Payer: Self-pay | Admitting: Pulmonary Disease

## 2018-09-26 NOTE — Telephone Encounter (Signed)
Prefilled Syringes: # 150mg  4  #75mg  2 Arrival Date: 09/26/18 Lot #: 150mg  [id number]      75mg  [id number] Exp Date: 150mg  02/2019   75mg  01/2019

## 2018-09-26 NOTE — Telephone Encounter (Signed)
Error

## 2018-09-26 NOTE — Telephone Encounter (Signed)
Prefilled Syringe: #150mg  4  #75mg  2 Ordered Date: 09/25/18 Shipping Date: 09/25/18 Created an encounter for today. I'll put the del. in that encounter. Closing this one.

## 2018-09-27 DIAGNOSIS — R600 Localized edema: Secondary | ICD-10-CM | POA: Diagnosis not present

## 2018-09-27 DIAGNOSIS — E871 Hypo-osmolality and hyponatremia: Secondary | ICD-10-CM | POA: Diagnosis not present

## 2018-09-27 DIAGNOSIS — N2889 Other specified disorders of kidney and ureter: Secondary | ICD-10-CM | POA: Diagnosis not present

## 2018-09-27 DIAGNOSIS — D649 Anemia, unspecified: Secondary | ICD-10-CM | POA: Diagnosis not present

## 2018-10-05 ENCOUNTER — Ambulatory Visit (INDEPENDENT_AMBULATORY_CARE_PROVIDER_SITE_OTHER): Payer: Medicare PPO

## 2018-10-05 DIAGNOSIS — J4540 Moderate persistent asthma, uncomplicated: Secondary | ICD-10-CM | POA: Diagnosis not present

## 2018-10-05 MED ORDER — OMALIZUMAB 150 MG ~~LOC~~ SOLR
375.0000 mg | SUBCUTANEOUS | Status: DC
  Administered 2018-10-05: 375 mg via SUBCUTANEOUS

## 2018-10-05 NOTE — Progress Notes (Signed)
Documentation of medication administration and charges of Xolair have been completed by Santiaga Butzin, CMA based on the hand written Xolair documentation sheet completed by Tammy Scott, who administered the medication.  

## 2018-10-08 ENCOUNTER — Ambulatory Visit (INDEPENDENT_AMBULATORY_CARE_PROVIDER_SITE_OTHER): Payer: Medicare PPO

## 2018-10-08 DIAGNOSIS — N319 Neuromuscular dysfunction of bladder, unspecified: Secondary | ICD-10-CM | POA: Diagnosis not present

## 2018-10-08 NOTE — Progress Notes (Signed)
Cath Change/ Replacement  Patient is present today for a catheter change due to urinary retention.  32ml of water was removed from the balloon, a 16FR foley cath was removed with out difficulty.  Patient was cleaned and prepped in a sterile fashion with betadine and 2% lidocaine jelly was instilled into the urethra. A 16 FR foley cath was replaced into the bladder no complications were noted Urine return was noted 15ml and urine was yellow in color. The balloon was filled with 62ml of sterile water. A leg bag was attached for drainage.  A night bag was also given to the patient and patient was given instruction on how to change from one bag to another. Patient was given proper instruction on catheter care.    Preformed by: Fonnie Jarvis, CMA  Follow up: 1 month exchange

## 2018-10-22 ENCOUNTER — Telehealth: Payer: Self-pay | Admitting: Internal Medicine

## 2018-10-22 ENCOUNTER — Ambulatory Visit: Payer: Self-pay

## 2018-10-22 MED ORDER — ONDANSETRON HCL 4 MG PO TABS: 4.0000 mg | ORAL_TABLET | Freq: Three times a day (TID) | ORAL | 1 refills | Status: DC | PRN

## 2018-10-22 NOTE — Telephone Encounter (Signed)
Pt calling concerning previous message and waiting for call back. Please call

## 2018-10-22 NOTE — Telephone Encounter (Signed)
Have him try reducing to 1 clinda pill tid in stead of 2 Okay ondansetron 4mg  tid prn for nausea #30 x 1

## 2018-10-22 NOTE — Telephone Encounter (Signed)
Pt has an abcess tooth but cannot get a sooner appt with dentist (Dr. Johnnette Litter) until 12/4. Dentist put him on cloindamycin (2 pills x 3 times a day) which is upsetting his stomach and causing him to vomit. He has constant nausea due to this antibiotic. He is requesting a med for nausea so he can keep the med in his system- dentist cannot prescribe.

## 2018-10-22 NOTE — Telephone Encounter (Signed)
Spoke to pt and sent in Zofran rx.

## 2018-10-24 ENCOUNTER — Ambulatory Visit: Payer: Medicare PPO | Admitting: Urology

## 2018-10-28 ENCOUNTER — Encounter: Payer: Self-pay | Admitting: Emergency Medicine

## 2018-10-28 ENCOUNTER — Other Ambulatory Visit: Payer: Self-pay

## 2018-10-28 ENCOUNTER — Observation Stay
Admission: EM | Admit: 2018-10-28 | Discharge: 2018-10-31 | Disposition: A | Discharge status: Home / Self Care | Payer: Medicare PPO | Attending: Internal Medicine | Admitting: Internal Medicine

## 2018-10-28 DIAGNOSIS — T83511A Infection and inflammatory reaction due to indwelling urethral catheter, initial encounter: Secondary | ICD-10-CM | POA: Diagnosis present

## 2018-10-28 DIAGNOSIS — K047 Periapical abscess without sinus: Secondary | ICD-10-CM | POA: Insufficient documentation

## 2018-10-28 DIAGNOSIS — Z882 Allergy status to sulfonamides status: Secondary | ICD-10-CM | POA: Diagnosis not present

## 2018-10-28 DIAGNOSIS — G629 Polyneuropathy, unspecified: Secondary | ICD-10-CM | POA: Diagnosis not present

## 2018-10-28 DIAGNOSIS — Z79899 Other long term (current) drug therapy: Secondary | ICD-10-CM | POA: Insufficient documentation

## 2018-10-28 DIAGNOSIS — K219 Gastro-esophageal reflux disease without esophagitis: Secondary | ICD-10-CM | POA: Diagnosis present

## 2018-10-28 DIAGNOSIS — M069 Rheumatoid arthritis, unspecified: Secondary | ICD-10-CM | POA: Insufficient documentation

## 2018-10-28 DIAGNOSIS — F329 Major depressive disorder, single episode, unspecified: Secondary | ICD-10-CM | POA: Diagnosis not present

## 2018-10-28 DIAGNOSIS — R531 Weakness: Secondary | ICD-10-CM

## 2018-10-28 DIAGNOSIS — I10 Essential (primary) hypertension: Secondary | ICD-10-CM | POA: Diagnosis present

## 2018-10-28 DIAGNOSIS — I2510 Atherosclerotic heart disease of native coronary artery without angina pectoris: Secondary | ICD-10-CM | POA: Diagnosis not present

## 2018-10-28 DIAGNOSIS — E871 Hypo-osmolality and hyponatremia: Secondary | ICD-10-CM | POA: Diagnosis not present

## 2018-10-28 DIAGNOSIS — N390 Urinary tract infection, site not specified: Secondary | ICD-10-CM | POA: Diagnosis not present

## 2018-10-28 DIAGNOSIS — Z8249 Family history of ischemic heart disease and other diseases of the circulatory system: Secondary | ICD-10-CM | POA: Insufficient documentation

## 2018-10-28 DIAGNOSIS — R4182 Altered mental status, unspecified: Secondary | ICD-10-CM | POA: Insufficient documentation

## 2018-10-28 DIAGNOSIS — Z888 Allergy status to other drugs, medicaments and biological substances status: Secondary | ICD-10-CM | POA: Diagnosis not present

## 2018-10-28 DIAGNOSIS — Z791 Long term (current) use of non-steroidal anti-inflammatories (NSAID): Secondary | ICD-10-CM | POA: Diagnosis not present

## 2018-10-28 DIAGNOSIS — F411 Generalized anxiety disorder: Secondary | ICD-10-CM | POA: Diagnosis not present

## 2018-10-28 DIAGNOSIS — G4733 Obstructive sleep apnea (adult) (pediatric): Secondary | ICD-10-CM | POA: Diagnosis not present

## 2018-10-28 DIAGNOSIS — Z7902 Long term (current) use of antithrombotics/antiplatelets: Secondary | ICD-10-CM | POA: Diagnosis not present

## 2018-10-28 DIAGNOSIS — Z96653 Presence of artificial knee joint, bilateral: Secondary | ICD-10-CM | POA: Insufficient documentation

## 2018-10-28 DIAGNOSIS — J4540 Moderate persistent asthma, uncomplicated: Secondary | ICD-10-CM | POA: Insufficient documentation

## 2018-10-28 DIAGNOSIS — E785 Hyperlipidemia, unspecified: Secondary | ICD-10-CM | POA: Diagnosis not present

## 2018-10-28 DIAGNOSIS — R600 Localized edema: Secondary | ICD-10-CM | POA: Insufficient documentation

## 2018-10-28 DIAGNOSIS — Z881 Allergy status to other antibiotic agents status: Secondary | ICD-10-CM | POA: Insufficient documentation

## 2018-10-28 DIAGNOSIS — C61 Malignant neoplasm of prostate: Secondary | ICD-10-CM | POA: Diagnosis not present

## 2018-10-28 DIAGNOSIS — K581 Irritable bowel syndrome with constipation: Secondary | ICD-10-CM | POA: Diagnosis not present

## 2018-10-28 DIAGNOSIS — Z923 Personal history of irradiation: Secondary | ICD-10-CM | POA: Insufficient documentation

## 2018-10-28 DIAGNOSIS — D649 Anemia, unspecified: Secondary | ICD-10-CM | POA: Diagnosis not present

## 2018-10-28 DIAGNOSIS — Z8546 Personal history of malignant neoplasm of prostate: Secondary | ICD-10-CM | POA: Insufficient documentation

## 2018-10-28 DIAGNOSIS — E222 Syndrome of inappropriate secretion of antidiuretic hormone: Secondary | ICD-10-CM | POA: Diagnosis present

## 2018-10-28 LAB — COMPREHENSIVE METABOLIC PANEL
ALK PHOS: 60 U/L (ref 38–126)
ALT: 15 U/L (ref 0–44)
AST: 23 U/L (ref 15–41)
Albumin: 4 g/dL (ref 3.5–5.0)
Anion gap: 9 (ref 5–15)
BUN: 14 mg/dL (ref 8–23)
CO2: 26 mmol/L (ref 22–32)
Calcium: 8.7 mg/dL — ABNORMAL LOW (ref 8.9–10.3)
Chloride: 89 mmol/L — ABNORMAL LOW (ref 98–111)
Creatinine, Ser: 0.5 mg/dL — ABNORMAL LOW (ref 0.61–1.24)
GFR calc Af Amer: >60 mL/min (ref >60)
Glucose, Bld: 134 mg/dL — ABNORMAL HIGH (ref 70–99)
Potassium: 3.8 mmol/L (ref 3.5–5.1)
Sodium: 124 mmol/L — ABNORMAL LOW (ref 135–145)
Total Bilirubin: 0.7 mg/dL (ref 0.3–1.2)
Total Protein: 6.3 g/dL — ABNORMAL LOW (ref 6.5–8.1)

## 2018-10-28 LAB — URINALYSIS, COMPLETE (UACMP) WITH MICROSCOPIC
Bilirubin Urine: NEGATIVE
Glucose, UA: NEGATIVE mg/dL
Ketones, ur: NEGATIVE mg/dL
Nitrite: POSITIVE — AB
Protein, ur: 30 mg/dL — AB
Specific Gravity, Urine: 1.004 — ABNORMAL LOW (ref 1.005–1.030)
WBC, UA: >50 WBC/hpf — ABNORMAL HIGH (ref 0–5)
pH: 7 (ref 5.0–8.0)

## 2018-10-28 LAB — CBC
HCT: 38.7 % — ABNORMAL LOW (ref 39.0–52.0)
Hemoglobin: 14.2 g/dL (ref 13.0–17.0)
MCH: 32.6 pg (ref 26.0–34.0)
MCHC: 36.7 g/dL — AB (ref 30.0–36.0)
MCV: 89 fL (ref 80.0–100.0)
Platelets: 251 10*3/uL (ref 150–400)
RBC: 4.35 MIL/uL (ref 4.22–5.81)
RDW: 13.6 % (ref 11.5–15.5)
WBC: 7.2 10*3/uL (ref 4.0–10.5)
nRBC: 0 % (ref 0.0–0.2)

## 2018-10-28 LAB — LIPASE, BLOOD: Lipase: 50 U/L (ref 11–51)

## 2018-10-28 MED ORDER — SODIUM CHLORIDE 0.9 % IV SOLN
1.0000 g | Freq: Three times a day (TID) | INTRAVENOUS | Status: DC
  Administered 2018-10-29 (×2): 1 g via INTRAVENOUS
  Filled 2018-10-28 (×4): qty 1

## 2018-10-28 MED ORDER — ALPRAZOLAM 0.5 MG PO TABS: 0.5000 mg | ORAL_TABLET | Freq: Three times a day (TID) | ORAL | Status: DC | PRN

## 2018-10-28 MED ORDER — HYDROXYCHLOROQUINE SULFATE 200 MG PO TABS
200.0000 mg | ORAL_TABLET | Freq: Two times a day (BID) | ORAL | Status: DC
  Administered 2018-10-29 – 2018-10-31 (×6): 200 mg via ORAL
  Filled 2018-10-28 (×7): qty 1

## 2018-10-28 MED ORDER — RISAQUAD PO CAPS
2.0000 | ORAL_CAPSULE | Freq: Every day | ORAL | Status: DC
  Administered 2018-10-29 – 2018-10-31 (×3): 2 via ORAL
  Filled 2018-10-28 (×3): qty 2

## 2018-10-28 MED ORDER — SODIUM CHLORIDE 0.9 % IV SOLN
Freq: Once | INTRAVENOUS | Status: AC
  Administered 2018-10-28: 21:00:00 via INTRAVENOUS

## 2018-10-28 MED ORDER — ONDANSETRON HCL 4 MG PO TABS
4.0000 mg | ORAL_TABLET | Freq: Four times a day (QID) | ORAL | Status: DC | PRN
  Administered 2018-10-29: 01:00:00 4 mg via ORAL
  Filled 2018-10-28: qty 1

## 2018-10-28 MED ORDER — PLECANATIDE 3 MG PO TABS: 1.0000 | ORAL_TABLET | Freq: Every day | ORAL | Status: DC

## 2018-10-28 MED ORDER — PRAVASTATIN SODIUM 20 MG PO TABS
20.0000 mg | ORAL_TABLET | Freq: Every day | ORAL | Status: DC
  Administered 2018-10-29 – 2018-10-31 (×3): 20 mg via ORAL
  Filled 2018-10-28 (×3): qty 1

## 2018-10-28 MED ORDER — TRAZODONE HCL 50 MG PO TABS
50.0000 mg | ORAL_TABLET | Freq: Every evening | ORAL | Status: DC | PRN
  Administered 2018-10-29: 01:00:00 50 mg via ORAL
  Filled 2018-10-28 (×2): qty 1

## 2018-10-28 MED ORDER — PANTOPRAZOLE SODIUM 40 MG PO TBEC
40.0000 mg | DELAYED_RELEASE_TABLET | Freq: Every day | ORAL | Status: DC
  Administered 2018-10-29 – 2018-10-31 (×3): 40 mg via ORAL
  Filled 2018-10-28 (×3): qty 1

## 2018-10-28 MED ORDER — ACETAMINOPHEN 650 MG RE SUPP: 650.0000 mg | Freq: Four times a day (QID) | RECTAL | Status: DC | PRN

## 2018-10-28 MED ORDER — ACETAMINOPHEN 325 MG PO TABS
650.0000 mg | ORAL_TABLET | Freq: Four times a day (QID) | ORAL | Status: DC | PRN
  Administered 2018-10-29 – 2018-10-31 (×2): 650 mg via ORAL
  Filled 2018-10-28 (×2): qty 2

## 2018-10-28 MED ORDER — ONDANSETRON HCL 4 MG/2ML IJ SOLN
4.0000 mg | Freq: Four times a day (QID) | INTRAMUSCULAR | Status: DC | PRN
  Administered 2018-10-29: 20:00:00 4 mg via INTRAVENOUS
  Filled 2018-10-28 (×2): qty 2

## 2018-10-28 MED ORDER — CLOPIDOGREL BISULFATE 75 MG PO TABS: 75.0000 mg | ORAL_TABLET | Freq: Every day | ORAL | Status: DC

## 2018-10-28 MED ORDER — ENOXAPARIN SODIUM 40 MG/0.4ML ~~LOC~~ SOLN
40.0000 mg | SUBCUTANEOUS | Status: DC
  Administered 2018-10-30: 40 mg via SUBCUTANEOUS
  Filled 2018-10-28: qty 0.4

## 2018-10-28 NOTE — ED Notes (Signed)
Pt presents to ED from home with c/c of nausea/vomiting x3 days. Pt states that he was recently Tx for a dental abscess and prescribed clindamycin. Pt reports N/V coincided with the clinda. He completed the medication on Wednesday and has not vomited since but continues to feel nauseated and loss of appetite. Denies any diarrhea. Pt reports Hx of hyponatremia and limited fluid intake of 1374mL daily as directed by his nephrologist. At time of assessment, blood was noted on patients upper teeth. Pt was very concerned and stated that "bloody gums are a sign of leukemia" .  EDP notified.

## 2018-10-28 NOTE — Progress Notes (Signed)
Pharmacy Antibiotic Note  [name] is a 84 y.o. male admitted on 10/28/2018 with UTI.  Pharmacy has been consulted for meropenem dosing.  Plan: Meropenem 1 gram q 8 hours ordered  Height: 5\' 10"  (177.8 cm) Weight: 200 lb (90.7 kg) IBW/kg (Calculated) : 73  Temp (24hrs), Avg:98.3 F (36.8 C), Min:98.1 F (36.7 C), Max:98.4 F (36.9 C)  Recent Labs  Lab 10/28/18 1816  WBC 7.2  CREATININE 0.50*    Estimated Creatinine Clearance: 80.7 mL/min (A) (by C-G formula based on SCr of 0.5 mg/dL (L)).    Allergies  Allergen Reactions  . Ciprofloxacin Nausea And Vomiting    Headache  . Citalopram Hydrobromide Other (See Comments)    "unknown"  . Lorazepam     Adverse reaction  . Paroxetine Nausea Only  . Ramipril Other (See Comments)    "unknown"  . Simvastatin   . Sulfa Antibiotics Other (See Comments)    Antimicrobials this admission: Meropenem 12/2  >>    >>   Dose adjustments this admission:   Microbiology results: 12/1 UCx: pending       12/1 UA LE(+)  NO2(+)  WBC >50 Thank you for allowing pharmacy to be a part of this patient's care.  Maher Shon S 10/28/2018 11:58 PM

## 2018-10-28 NOTE — ED Provider Notes (Signed)
[facility] Emergency Department Provider Note  ____________________________________________   I have reviewed the triage vital signs and the nursing notes.   HISTORY  Chief Complaint Weakness  History limited by: Not Limited   HPI [name] is a 84 y.o. male who presents to the emergency department today with primary concern for weakness and possible sodium issues.  Patient states he has a history of hyponatremia.  He is noticed over the past couple days he is become more weak and having a harder time ambulating with his walker.  He does state that he has been drinking more than he should be on his fluid restriction.  He states he is doing this because he was running slightly treated with antibiotics and had a fair amount of nausea and vomiting.  He was tried to rehydrate himself.  Patient denies any fevers.  Denies any chest pain shortness of breath cough.    Per medical record review patient has a history of hyponatremia  Past Medical History:  Diagnosis Date  . Allergy   . Anemia   . Anxiety   . Arthritis    rheumatoid  . Asthma   . Basal cell carcinoma    back  . CAD (coronary artery disease)   . Cancer [facility])    Prostate  . Chronic sinusitis   . Depression   . Diverticulitis   . ED (erectile dysfunction)   . GERD (gastroesophageal reflux disease)   . History of SIADH   . Hx of adenomatous colonic polyps   . Hypertension   . Hyponatremia   . IBS (irritable bowel syndrome)   . Kidney lesion   . Neurodermatitis   . Neurogenic bladder   . OSA (obstructive sleep apnea)     Patient Active Problem List   Diagnosis Date Noted  . Chronic constipation 07/25/2018  . Right renal mass 07/25/2018  . TIA (transient ischemic attack) 06/06/2018  . Carotid artery disease ([place]) 06/06/2018  . Expressive aphasia 05/25/2018  . Complicated UTI (urinary tract infection) 10/18/2017  . SIADH (syndrome of inappropriate ADH production) ([place]) 04/17/2017   . Hyponatremia 01/16/2017  . Asthma with acute exacerbation 11/14/2016  . Mood disorder ([place]) 01/18/2016  . Presence of indwelling urinary catheter 08/20/2015  . Advanced directives, counseling/discussion 07/28/2014  . Routine general medical examination at a health care facility 04/09/2012  . History of prostate cancer 04/09/2012  . Venous stasis dermatitis 06/13/2011  . Chronic sinusitis   . CONSTIPATION, CHRONIC 12/07/2010  . Hyperlipemia 12/02/2010  . OSTEOARTHRITIS 12/02/2010  . NEUROGENIC BLADDER 05/24/2010  . IBS 05/05/2010  . PERSONAL HISTORY OF COLONIC POLYPS 05/05/2010  . NEUROPATHY 05/03/2010  . Essential hypertension, benign 03/25/2009  . GAD (generalized anxiety disorder) 01/27/2007  . Coronary atherosclerosis of native coronary artery 01/27/2007  . Allergic asthma 01/27/2007  . GERD 01/27/2007  . Chronic rheumatic arthritis ([place]) 01/27/2007    Past Surgical History:  Procedure Laterality Date  . KNEE ARTHROPLASTY Right 09/02/2015   Procedure: COMPUTER ASSISTED TOTAL KNEE ARTHROPLASTY;  Surgeon: [name], MD;  Location: [facility] ORS;  Service: Orthopedics;  Laterality: Right;  . NASAL SINUS SURGERY  2009   DEVIATED SEPTUM AND POLYPS  . permanent indwelling catheter    . PROSTATE SURGERY     PROSTATECTOMY  . TOTAL KNEE ARTHROPLASTY Left 9/15   Dr [name]  . URETHRAL STRICTURE DILATATION  02-2010   Dr.Cope    Prior to Admission medications   Medication Sig Start Date End Date  Taking? Authorizing Provider  albuterol (PROVENTIL HFA;VENTOLIN HFA) 108 (90 Base) MCG/ACT inhaler Inhale 2 puffs into the lungs every 6 (six) hours as needed for wheezing or shortness of breath. 11/14/16   [name], MD  ALPRAZolam Duanne Moron) 0.5 MG tablet TAKE 1 TABLET BY MOUTH THREE TIMES DAILY AS NEEDED FOR ANXIETY OR SLEEP 09/17/18   [name], MD  Cholecalciferol (VITAMIN D3) 5000 UNITS CAPS Take 1 capsule by mouth daily.    [provider]  clopidogrel (PLAVIX)  75 MG tablet  06/20/18   [provider]  Coenzyme Q10 (CO Q 10) 100 MG CAPS Take 1 capsule by mouth daily.    [provider]  Cranberry 425 MG CAPS Take 2 capsules by mouth daily.    [provider]  fluticasone (FLONASE) 50 MCG/ACT nasal spray Place 2 sprays into both nostrils 2 (two) times daily. 04/27/17   [name], MD  folic acid (FOLVITE) 1 MG tablet Take 1 tablet (1 mg total) by mouth daily. 04/27/17   [name], MD  gabapentin (NEURONTIN) 300 MG capsule TAKE 1 CAPSULE BY MOUTH THREE TIMES DAILY 07/12/16   [name], MD  hydroxychloroquine (PLAQUENIL) 200 MG tablet Take 1 tablet by mouth 2 (two) times daily. 01/22/16   [provider]  ibuprofen (ADVIL,MOTRIN) 400 MG tablet Take by mouth. 04/27/18   [provider]  linaclotide Rolan Lipa) 72 MCG capsule Take 1 capsule (72 mcg total) by mouth daily before breakfast. 09/03/18   [name], MD  methotrexate (RHEUMATREX) 2.5 MG tablet Take 10 tablets by mouth once a week. 01/07/16   [provider]  Milk Thistle 250 MG CAPS Take 1 capsule by mouth daily.    [provider]  Multiple Vitamin (MULTIVITAMIN) tablet Take 1 tablet by mouth daily.      [provider]  MYRBETRIQ 50 MG TB24 tablet  06/17/18   [provider]  nystatin (MYCOSTATIN/NYSTOP) powder Apply topically 4 (four) times daily. 05/17/18   [name], MD  omeprazole (PRILOSEC) 20 MG capsule TAKE 1 CAPSULE(20 MG) BY MOUTH DAILY AS NEEDED 08/16/18   [name], MD  ondansetron (ZOFRAN) 4 MG tablet Take 1 tablet (4 mg total) by mouth every 8 (eight) hours as needed for nausea or vomiting. 10/22/18   [name], MD  Plecanatide (TRULANCE) 3 MG TABS Take 1 tablet by mouth daily. 09/25/18   Ladene Artist, MD  polyethylene glycol [facility]) packet Take 34 g by mouth daily.     [provider]  pravastatin (PRAVACHOL) 20 MG tablet TAKE 1 TABLET BY MOUTH EVERY  DAY 08/06/18   [name], MD  Probiotic Product (ALIGN) 4 MG CAPS Take 1 capsule (4 mg total) by mouth daily. 12/15/16   Nicholes Mango, MD  Selenium 200 MCG CAPS Take 1 capsule by mouth daily.    [provider]  Turmeric 500 MG CAPS Take 500 mg by mouth daily.    [provider]  vitamin B-12 (CYANOCOBALAMIN) 500 MCG tablet Take 500 mcg by mouth daily.    [provider]    Allergies Ciprofloxacin; Citalopram hydrobromide; Lorazepam; Paroxetine; Ramipril; Simvastatin; and Sulfa antibiotics  Family History  Problem Relation Age of Onset  . Heart disease Mother 21       heart failure  . Pneumonia Father 27       pnemonia  . Skin cancer Father   . Skin cancer Sister   .  Skin cancer Son   . Colon cancer Neg Hx   . Esophageal cancer Neg Hx   . Stomach cancer Neg Hx   . Pancreatic cancer Neg Hx   . Liver disease Neg Hx     Social History Social History   Tobacco Use  . Smoking status: Never Smoker  . Smokeless tobacco: Never Used  Substance Use Topics  . Alcohol use: Yes    Comment: 5 days out of 7 drinks gin   . Drug use: No    Review of Systems Constitutional: Positive for weakness. Eyes: No visual changes. ENT:  Positive for gum bleeding.  Cardiovascular: Denies chest pain. Respiratory: Denies shortness of breath. Gastrointestinal: No abdominal pain. Positive for recent nausea and vomiting.  Genitourinary: Negative for dysuria. Musculoskeletal: Negative for back pain. Skin: Negative for rash. Neurological: Negative for headaches, focal weakness or numbness.  ____________________________________________   PHYSICAL EXAM:  VITAL SIGNS: ED Triage Vitals  Enc Vitals Group     BP 10/28/18 1800 116/63     Pulse Rate 10/28/18 1800 93     Resp 10/28/18 1800 18     Temp 10/28/18 1800 98.1 F (36.7 C)     Temp Source 10/28/18 1800 Oral     SpO2 10/28/18 1800 97 %     Weight 10/28/18 1807 200 lb (90.7 kg)     Height 10/28/18 1807 5'  10" (1.778 m)     Head Circumference --      Peak Flow --      Pain Score 10/28/18 1807 0   Constitutional: Alert and oriented.  Eyes: Conjunctivae are normal.  ENT      Head: Normocephalic and atraumatic.      Nose: No congestion/rhinnorhea.      Mouth/Throat: Mucous membranes are moist.      Neck: No stridor. Hematological/Lymphatic/Immunilogical: No cervical lymphadenopathy. Cardiovascular: Normal rate, regular rhythm.  No murmurs, rubs, or gallops.  Respiratory: Normal respiratory effort without tachypnea nor retractions. Breath sounds are clear and equal bilaterally. No wheezes/rales/rhonchi. Gastrointestinal: Soft and non tender. No rebound. No guarding.  Genitourinary: Deferred Musculoskeletal: Normal range of motion in all extremities. No lower extremity edema. Neurologic:  Normal speech and language. No gross focal neurologic deficits are appreciated.  Skin:  Skin is warm, dry and intact. No rash noted. Psychiatric: Mood and affect are normal. Speech and behavior are normal. Patient exhibits appropriate insight and judgment.  ____________________________________________    LABS (pertinent positives/negatives)  Lipase 50 CBC wbc 7.2, hgb 14.2, plt 251 CMP na 124, cr 0.50, glu 134  ____________________________________________   EKG  I, Nance Pear, attending physician, personally viewed and interpreted this EKG  EKG Time: 1811 Rate: 90 Rhythm: normal sinus rhythm Axis: rightward axis Intervals: qtc 506 QRS: RBBB ST changes: no st elevation Impression: abnormal ekg   ____________________________________________    RADIOLOGY  None  ____________________________________________   PROCEDURES  Procedures  ____________________________________________   INITIAL IMPRESSION / ASSESSMENT AND PLAN / ED COURSE  Pertinent labs & imaging results that were available during my care of the patient were reviewed by me and considered in my medical decision  making (see chart for details).   Patient presented to the emergency department today because of concerns for possible sodium issue.  Patient has been weak over the past couple days.  Sodium levels 124 here.  Given the patient is symptomatic will plan on admission.  Discussed findings plan with patient and family.    ____________________________________________   FINAL CLINICAL  IMPRESSION(S) / ED DIAGNOSES  Final diagnoses:  Weakness  Hyponatremia     Note: This dictation was prepared with Dragon dictation. Any transcriptional errors that result from this process are unintentional     Nance Pear, MD 10/28/18 2102

## 2018-10-28 NOTE — ED Notes (Signed)
Admitting MD finished with assessment; pharm tech at bedside to reconcile home medications

## 2018-10-28 NOTE — H&P (Signed)
[place] [facility] [place] NAME: [name]    MR#:  [id number]  DATE OF BIRTH:  10-06-1934  DATE OF ADMISSION:  10/28/2018  PRIMARY CARE PHYSICIAN: [name], MD   REQUESTING/REFERRING PHYSICIAN: Archie Balboa, MD  CHIEF COMPLAINT:   Chief Complaint  Patient presents with  . Emesis    HISTORY OF PRESENT ILLNESS:  [name]  is a 84 y.o. male who presents with chief complaint as above.  Patient presents with several days of increasing weakness.  He has a history of hyponatremia treated with fluid restriction typically.  He states that a couple of weeks ago he developed dental abscess and was put on amoxicillin and subsequently clindamycin by his dentist.  He had significant GI upset with nausea and vomiting while on clindamycin.  He finished his clindamycin course 4 to 5 days ago, and has not continued vomiting since then, but has had persistent nausea.  He was also not maintaining his fluid restriction while he was vomiting, as he felt he needed the additional fluids.  He does have a history of chronic indwelling Foley due to prior prostate cancer and radiation treatment.  He has history of frequent UTIs as well.  UA tonight is concerning for UTI.  Hospitalist were called for admission and further evaluation  PAST MEDICAL HISTORY:   Past Medical History:  Diagnosis Date  . Allergy   . Anemia   . Anxiety   . Arthritis    rheumatoid  . Asthma   . Basal cell carcinoma    back  . CAD (coronary artery disease)   . Cancer [facility])    Prostate  . Chronic sinusitis   . Depression   . Diverticulitis   . ED (erectile dysfunction)   . GERD (gastroesophageal reflux disease)   . History of SIADH   . Hx of adenomatous colonic polyps   . Hypertension   . Hyponatremia   . IBS (irritable bowel syndrome)   . Kidney lesion   . Neurodermatitis   . Neurogenic bladder   . OSA (obstructive sleep apnea)      PAST SURGICAL HISTORY:   Past  Surgical History:  Procedure Laterality Date  . KNEE ARTHROPLASTY Right 09/02/2015   Procedure: COMPUTER ASSISTED TOTAL KNEE ARTHROPLASTY;  Surgeon: [name], MD;  Location: [facility] ORS;  Service: Orthopedics;  Laterality: Right;  . NASAL SINUS SURGERY  2009   DEVIATED SEPTUM AND POLYPS  . permanent indwelling catheter    . PROSTATE SURGERY     PROSTATECTOMY  . TOTAL KNEE ARTHROPLASTY Left 9/15   Dr [name]  . URETHRAL STRICTURE DILATATION  02-2010   Dr.Cope     SOCIAL HISTORY:   Social History   Tobacco Use  . Smoking status: Never Smoker  . Smokeless tobacco: Never Used  Substance Use Topics  . Alcohol use: Yes    Comment: 5 days out of 7 drinks gin      FAMILY HISTORY:   Family History  Problem Relation Age of Onset  . Heart disease Mother 26       heart failure  . Pneumonia Father 53       pnemonia  . Skin cancer Father   . Skin cancer Sister   . Skin cancer Son   . Colon cancer Neg Hx   . Esophageal cancer Neg Hx   . Stomach cancer Neg Hx   . Pancreatic cancer Neg Hx   . Liver disease  Neg Hx      DRUG ALLERGIES:   Allergies  Allergen Reactions  . Ciprofloxacin Nausea And Vomiting    Headache  . Citalopram Hydrobromide Other (See Comments)    "unknown"  . Lorazepam     Adverse reaction  . Paroxetine Nausea Only  . Ramipril Other (See Comments)    "unknown"  . Simvastatin   . Sulfa Antibiotics Other (See Comments)    MEDICATIONS AT HOME:   Prior to Admission medications   Medication Sig Start Date End Date Taking? Authorizing Provider  albuterol (PROVENTIL HFA;VENTOLIN HFA) 108 (90 Base) MCG/ACT inhaler Inhale 2 puffs into the lungs every 6 (six) hours as needed for wheezing or shortness of breath. 11/14/16   [name], MD  ALPRAZolam Duanne Moron) 0.5 MG tablet TAKE 1 TABLET BY MOUTH THREE TIMES DAILY AS NEEDED FOR ANXIETY OR SLEEP 09/17/18   [name], MD  Cholecalciferol (VITAMIN D3) 5000 UNITS CAPS Take 1 capsule by mouth daily.     [provider]  clopidogrel (PLAVIX) 75 MG tablet  06/20/18   [provider]  Coenzyme Q10 (CO Q 10) 100 MG CAPS Take 1 capsule by mouth daily.    [provider]  Cranberry 425 MG CAPS Take 2 capsules by mouth daily.    [provider]  fluticasone (FLONASE) 50 MCG/ACT nasal spray Place 2 sprays into both nostrils 2 (two) times daily. 04/27/17   [name], MD  folic acid (FOLVITE) 1 MG tablet Take 1 tablet (1 mg total) by mouth daily. 04/27/17   [name], MD  gabapentin (NEURONTIN) 300 MG capsule TAKE 1 CAPSULE BY MOUTH THREE TIMES DAILY 07/12/16   [name], MD  hydroxychloroquine (PLAQUENIL) 200 MG tablet Take 1 tablet by mouth 2 (two) times daily. 01/22/16   [provider]  ibuprofen (ADVIL,MOTRIN) 400 MG tablet Take by mouth. 04/27/18   [provider]  linaclotide Rolan Lipa) 72 MCG capsule Take 1 capsule (72 mcg total) by mouth daily before breakfast. 09/03/18   [name], MD  methotrexate (RHEUMATREX) 2.5 MG tablet Take 10 tablets by mouth once a week. 01/07/16   [provider]  Milk Thistle 250 MG CAPS Take 1 capsule by mouth daily.    [provider]  Multiple Vitamin (MULTIVITAMIN) tablet Take 1 tablet by mouth daily.      [provider]  MYRBETRIQ 50 MG TB24 tablet  06/17/18   [provider]  nystatin (MYCOSTATIN/NYSTOP) powder Apply topically 4 (four) times daily. 05/17/18   [name], MD  omeprazole (PRILOSEC) 20 MG capsule TAKE 1 CAPSULE(20 MG) BY MOUTH DAILY AS NEEDED 08/16/18   [name], MD  ondansetron (ZOFRAN) 4 MG tablet Take 1 tablet (4 mg total) by mouth every 8 (eight) hours as needed for nausea or vomiting. 10/22/18   [name], MD  Plecanatide (TRULANCE) 3 MG TABS Take 1 tablet by mouth daily. 09/25/18   Ladene Artist, MD  polyethylene glycol [facility]) packet Take 34 g by mouth daily.     [provider]  pravastatin  (PRAVACHOL) 20 MG tablet TAKE 1 TABLET BY MOUTH EVERY DAY 08/06/18   [name], MD  Probiotic Product (ALIGN) 4 MG CAPS Take 1 capsule (4 mg total) by mouth daily. 12/15/16   Nicholes Mango, MD  Selenium 200 MCG CAPS Take 1 capsule by mouth daily.    [provider]  Turmeric 500 MG CAPS Take 500 mg  by mouth daily.    [provider]  vitamin B-12 (CYANOCOBALAMIN) 500 MCG tablet Take 500 mcg by mouth daily.    [provider]    REVIEW OF SYSTEMS:  Review of Systems  Constitutional: Negative for chills, fever, malaise/fatigue and weight loss.  HENT: Negative for ear pain, hearing loss and tinnitus.   Eyes: Negative for blurred vision, double vision, pain and redness.  Respiratory: Negative for cough, hemoptysis and shortness of breath.   Cardiovascular: Negative for chest pain, palpitations, orthopnea and leg swelling.  Gastrointestinal: Positive for nausea. Negative for abdominal pain, constipation, diarrhea and vomiting.  Genitourinary: Negative for dysuria, frequency and hematuria.  Musculoskeletal: Negative for back pain, joint pain and neck pain.  Skin:       No acne, rash, or lesions  Neurological: Positive for weakness. Negative for dizziness, tremors and focal weakness.  Endo/Heme/Allergies: Negative for polydipsia. Does not bruise/bleed easily.  Psychiatric/Behavioral: Negative for depression. The patient is not nervous/anxious and does not have insomnia.      VITAL SIGNS:   Vitals:   10/28/18 1800 10/28/18 1807 10/28/18 1947  BP: 116/63  (!) 162/73  Pulse: 93  78  Resp: 18  18  Temp: 98.1 F (36.7 C)  98.2 F (36.8 C)  TempSrc: Oral  Oral  SpO2: 97%  96%  Weight:  90.7 kg   Height:  5\' 10"  (1.778 m)    Wt Readings from Last 3 Encounters:  10/28/18 90.7 kg  09/25/18 88.3 kg  09/10/18 87.1 kg    PHYSICAL EXAMINATION:  Physical Exam  Vitals reviewed. Constitutional: He is oriented to person, place, and time. He appears  well-developed and well-nourished. No distress.  HENT:  Head: Normocephalic and atraumatic.  Mouth/Throat: Oropharynx is clear and moist.  Eyes: Pupils are equal, round, and reactive to light. Conjunctivae and EOM are normal. No scleral icterus.  Neck: Normal range of motion. Neck supple. No JVD present. No thyromegaly present.  Cardiovascular: Normal rate, regular rhythm and intact distal pulses. Exam reveals no gallop and no friction rub.  No murmur heard. Respiratory: Effort normal and breath sounds normal. No respiratory distress. He has no wheezes. He has no rales.  GI: Soft. Bowel sounds are normal. He exhibits no distension. There is tenderness.  Musculoskeletal: Normal range of motion. He exhibits no edema.  No arthritis, no gout  Lymphadenopathy:    He has no cervical adenopathy.  Neurological: He is alert and oriented to person, place, and time. No cranial nerve deficit.  No dysarthria, no aphasia  Skin: Skin is warm and dry. No rash noted. No erythema.  Psychiatric: He has a normal mood and affect. His behavior is normal. Judgment and thought content normal.    LABORATORY PANEL:   CBC Recent Labs  Lab 10/28/18 1816  WBC 7.2  HGB 14.2  HCT 38.7*  PLT 251   ------------------------------------------------------------------------------------------------------------------  Chemistries  Recent Labs  Lab 10/28/18 1816  NA 124*  K 3.8  CL 89*  CO2 26  GLUCOSE 134*  BUN 14  CREATININE 0.50*  CALCIUM 8.7*  AST 23  ALT 15  ALKPHOS 60  BILITOT 0.7   ------------------------------------------------------------------------------------------------------------------  Cardiac Enzymes No results for input(s): TROPONINI in the last 168 hours. ------------------------------------------------------------------------------------------------------------------  RADIOLOGY:  No results found.  EKG:   Orders placed or performed during the hospital encounter of 10/28/18   . ED EKG  . ED EKG    IMPRESSION AND PLAN:  Principal Problem:   Hyponatremia -likely multifactorial,  due to not maintaining his fluid restriction, also possibly due to UTI.  Patient is not dehydrated, he is currently euvolemic.  He received normal saline in the ED.  We will check his sodium level again in the morning, and most likely fluid restrict him from there Active Problems:   UTI (urinary tract infection) -patient has a history of UTI with resistant organisms, will treat with meropenem, urine culture sent   GAD (generalized anxiety disorder) -home dose anxiolytics   Essential hypertension, benign -home dose antihypertensives   Coronary atherosclerosis of native coronary artery -continue home medications   GERD -Home dose PPI   Hyperlipemia -Home dose antilipid  Chart review performed and case discussed with ED provider. Labs, imaging and/or ECG reviewed by provider and discussed with patient/family. Management plans discussed with the patient and/or family.  DVT PROPHYLAXIS: SubQ lovenox   GI PROPHYLAXIS:  PPI   ADMISSION STATUS: Observation  CODE STATUS: Full Code Status History    Date Active Date Inactive Code Status Order ID Comments User Context   05/25/2018 2236 05/26/2018 1653 Full Code [id number]  Lance Coon, MD Inpatient   10/18/2017 2238 10/22/2017 2015 Full Code [id number]  Gorden Harms, MD Inpatient   04/03/2017 0632 04/05/2017 1808 Full Code [id number]  Harrie Foreman, MD Inpatient   12/11/2016 2139 12/15/2016 1920 Full Code [id number]  Demetrios Loll, MD Inpatient   09/07/2015 1505 09/08/2015 1744 Full Code [id number]  Dustin Flock, MD Inpatient   09/02/2015 1716 09/05/2015 1652 Full Code [id number]  Hooten, Laurice Record, MD Inpatient      TOTAL TIME TAKING CARE OF THIS PATIENT: 40 minutes.   Jacqulynn Shappell Berry 10/28/2018, 9:49 PM  [facility]  Office  [phone]  CC: Primary care physician; [name], MD  Note:  This document  was prepared using Dragon voice recognition software and may include unintentional dictation errors.

## 2018-10-28 NOTE — ED Notes (Signed)
Collected urine sample from indwelling catheter. Cleaned catheter connection aperture with alcohol swab. Disconnected catheter from leg bag and drained approx 2 cc of urine from catheter into sterile collection container. Reconnected catheter to leg bag. Pt tolerated procedure well. Sample sent to lab.

## 2018-10-28 NOTE — ED Triage Notes (Signed)
Pt arrived with daughter with concerns over nausea and vomiting for the last few weeks. Pt was prescribed clindamycin and states he threw up every day for the last four days he took it. Pt denies diarrhea. Pt states he finished his clindamycin Wednesday and since then he has felt weak. Pt has a hx of hyponatremia which lead them come in to the ED for evaluation of lab values.

## 2018-10-28 NOTE — ED Notes (Signed)
ED TO INPATIENT HANDOFF REPORT  Name/Age/Gender [name] 84 y.o. male  Code Status Code Status History    Date Active Date Inactive Code Status Order ID Comments User Context   05/25/2018 2236 05/26/2018 1653 Full Code [id number]  Lance Coon, MD Inpatient   10/18/2017 2238 10/22/2017 2015 Full Code [id number]  Gorden Harms, MD Inpatient   04/03/2017 0632 04/05/2017 1808 Full Code [id number]  Harrie Foreman, MD Inpatient   12/11/2016 2139 12/15/2016 1920 Full Code [id number]  Demetrios Loll, MD Inpatient   09/07/2015 1505 09/08/2015 1744 Full Code [id number]  Dustin Flock, MD Inpatient   09/02/2015 1716 09/05/2015 1652 Full Code [id number]  Hooten, Laurice Record, MD Inpatient      Home/SNF/Other Home  Chief Complaint Nausea  Level of Care/Admitting Diagnosis ED Disposition    ED Disposition Condition [place]: [place] [100120]  Level of Care: Med-Surg [16]  Diagnosis: Hyponatremia [761607]  Admitting Physician: Lance Coon [[id number]]  Attending Physician: Lance Coon [[id number]]  PT Class (Do Not Modify): Observation [104]  PT Acc Code (Do Not Modify): Observation [10022]       Medical History Past Medical History:  Diagnosis Date  . Allergy   . Anemia   . Anxiety   . Arthritis    rheumatoid  . Asthma   . Basal cell carcinoma    back  . CAD (coronary artery disease)   . Cancer [facility])    Prostate  . Chronic sinusitis   . Depression   . Diverticulitis   . ED (erectile dysfunction)   . GERD (gastroesophageal reflux disease)   . History of SIADH   . Hx of adenomatous colonic polyps   . Hypertension   . Hyponatremia   . IBS (irritable bowel syndrome)   . Kidney lesion   . Neurodermatitis   . Neurogenic bladder   . OSA (obstructive sleep apnea)     Allergies Allergies  Allergen Reactions  . Ciprofloxacin Nausea And Vomiting    Headache  . Citalopram Hydrobromide Other (See Comments)    "unknown"  . Lorazepam      Adverse reaction  . Paroxetine Nausea Only  . Ramipril Other (See Comments)    "unknown"  . Simvastatin   . Sulfa Antibiotics Other (See Comments)    IV Location/Drains/Wounds Patient Lines/Drains/Airways Status   Active Line/Drains/Airways    Name:   Placement date:   Placement time:   Site:   Days:   Peripheral IV 10/28/18 Left Arm   10/28/18    1816    Arm   less than 1   Urethral Catheter present prior to admission per report   03/22/17    -    -   585   Urethral Catheter Kasey, RN Double-lumen 16 Fr.   10/18/17    2152    Double-lumen   375          Labs/Imaging Results for orders placed or performed during the hospital encounter of 10/28/18 (from the past 48 hour(s))  Lipase, blood     Status: None   Collection Time: 10/28/18  6:16 PM  Result Value Ref Range   Lipase 50 11 - 51 U/L    Comment: Performed at [facility], [address] [place], [place] [address]  Comprehensive metabolic panel     Status: Abnormal   Collection Time: 10/28/18  6:16 PM  Result Value Ref Range   Sodium 124 (L) 135 -  145 mmol/L   Potassium 3.8 3.5 - 5.1 mmol/L   Chloride 89 (L) 98 - 111 mmol/L   CO2 26 22 - 32 mmol/L   Glucose, Bld 134 (H) 70 - 99 mg/dL   BUN 14 8 - 23 mg/dL   Creatinine, Ser 0.50 (L) 0.61 - 1.24 mg/dL   Calcium 8.7 (L) 8.9 - 10.3 mg/dL   Total Protein 6.3 (L) 6.5 - 8.1 g/dL   Albumin 4.0 3.5 - 5.0 g/dL   AST 23 15 - 41 U/L   ALT 15 0 - 44 U/L   Alkaline Phosphatase 60 38 - 126 U/L   Total Bilirubin 0.7 0.3 - 1.2 mg/dL   GFR calc non Af Amer >60 >60 mL/min   GFR calc Af Amer >60 >60 mL/min   Anion gap 9 5 - 15    Comment: Performed at [facility], [address] [place], [place] [address]  CBC     Status: Abnormal   Collection Time: 10/28/18  6:16 PM  Result Value Ref Range   WBC 7.2 4.0 - 10.5 K/uL   RBC 4.35 4.22 - 5.81 MIL/uL   Hemoglobin 14.2 13.0 - 17.0 g/dL   HCT 38.7 (L) 39.0 - 52.0 %   MCV 89.0 80.0 - 100.0 fL   MCH 32.6 26.0  - 34.0 pg   MCHC 36.7 (H) 30.0 - 36.0 g/dL   RDW 13.6 11.5 - 15.5 %   Platelets 251 150 - 400 K/uL   nRBC 0.0 0.0 - 0.2 %    Comment: Performed at [facility], [address] [place], [place] [address]   No results found.  Pending Labs Unresulted Labs (From admission, onward)    Start     Ordered   10/28/18 1809  Urinalysis, Complete w Microscopic  ONCE - STAT,   STAT     10/28/18 1808   Signed and Held  CBC  (enoxaparin (LOVENOX)    CrCl >/= 30 ml/min)  Once,   R    Comments:  Baseline for enoxaparin therapy IF NOT ALREADY DRAWN.  Notify MD if PLT < 100 K.    Signed and Held   Signed and Held  Creatinine, serum  (enoxaparin (LOVENOX)    CrCl >/= 30 ml/min)  Once,   R    Comments:  Baseline for enoxaparin therapy IF NOT ALREADY DRAWN.    Signed and Held   Signed and Held  Creatinine, serum  (enoxaparin (LOVENOX)    CrCl >/= 30 ml/min)  Weekly,   R    Comments:  while on enoxaparin therapy    Signed and Held   Signed and Held  Basic metabolic panel  Tomorrow morning,   R     Signed and Held   Signed and Held  CBC  Tomorrow morning,   R     Signed and Held          Vitals/Pain Today's Vitals   10/28/18 1800 10/28/18 1807 10/28/18 1947 10/28/18 2213  BP: 116/63  (!) 162/73 (!) 148/76  Pulse: 93  78 85  Resp: 18  18 18   Temp: 98.1 F (36.7 C)  98.2 F (36.8 C) 98.4 F (36.9 C)  TempSrc: Oral  Oral Oral  SpO2: 97%  96% 100%  Weight:  90.7 kg    Height:  5\' 10"  (1.778 m)    PainSc:  0-No pain 0-No pain 0-No pain    Isolation Precautions No active isolations  Medications Medications  0.9 %  sodium chloride infusion ( Intravenous New Bag/Given 10/28/18 2054)    Mobility walks with person assist  Report called to [place] on 1C. Pt to transport to [facility]m. [place]

## 2018-10-29 ENCOUNTER — Ambulatory Visit: Payer: Self-pay

## 2018-10-29 DIAGNOSIS — I10 Essential (primary) hypertension: Secondary | ICD-10-CM | POA: Diagnosis not present

## 2018-10-29 DIAGNOSIS — N390 Urinary tract infection, site not specified: Secondary | ICD-10-CM | POA: Diagnosis not present

## 2018-10-29 DIAGNOSIS — F411 Generalized anxiety disorder: Secondary | ICD-10-CM | POA: Diagnosis not present

## 2018-10-29 DIAGNOSIS — E871 Hypo-osmolality and hyponatremia: Secondary | ICD-10-CM | POA: Diagnosis not present

## 2018-10-29 LAB — CBC
HCT: 35.3 % — ABNORMAL LOW (ref 39.0–52.0)
Hemoglobin: 12.8 g/dL — ABNORMAL LOW (ref 13.0–17.0)
MCH: 32.5 pg (ref 26.0–34.0)
MCHC: 36.3 g/dL — ABNORMAL HIGH (ref 30.0–36.0)
MCV: 89.6 fL (ref 80.0–100.0)
Platelets: 200 10*3/uL (ref 150–400)
RBC: 3.94 MIL/uL — ABNORMAL LOW (ref 4.22–5.81)
RDW: 13.5 % (ref 11.5–15.5)
WBC: 6.3 10*3/uL (ref 4.0–10.5)
nRBC: 0 % (ref 0.0–0.2)

## 2018-10-29 LAB — BASIC METABOLIC PANEL
Anion gap: 8 (ref 5–15)
BUN: 14 mg/dL (ref 8–23)
CO2: 25 mmol/L (ref 22–32)
CREATININE: 0.43 mg/dL — AB (ref 0.61–1.24)
Calcium: 8.5 mg/dL — ABNORMAL LOW (ref 8.9–10.3)
Chloride: 93 mmol/L — ABNORMAL LOW (ref 98–111)
GFR calc Af Amer: >60 mL/min (ref >60)
GFR calc non Af Amer: >60 mL/min (ref >60)
Glucose, Bld: 100 mg/dL — ABNORMAL HIGH (ref 70–99)
Potassium: 3.8 mmol/L (ref 3.5–5.1)
Sodium: 126 mmol/L — ABNORMAL LOW (ref 135–145)

## 2018-10-29 LAB — SODIUM
SODIUM: 126 mmol/L — AB (ref 135–145)
Sodium: 124 mmol/L — ABNORMAL LOW (ref 135–145)

## 2018-10-29 MED ORDER — BISACODYL 5 MG PO TBEC
5.0000 mg | DELAYED_RELEASE_TABLET | Freq: Every day | ORAL | Status: DC | PRN
  Administered 2018-10-29: 5 mg via ORAL
  Filled 2018-10-29: qty 1

## 2018-10-29 MED ORDER — SENNOSIDES-DOCUSATE SODIUM 8.6-50 MG PO TABS
1.0000 | ORAL_TABLET | Freq: Every evening | ORAL | Status: DC | PRN
  Administered 2018-10-29 – 2018-10-31 (×2): 1 via ORAL
  Filled 2018-10-29 (×2): qty 1

## 2018-10-29 MED ORDER — SIMETHICONE 80 MG PO CHEW
80.0000 mg | CHEWABLE_TABLET | Freq: Four times a day (QID) | ORAL | Status: DC | PRN
  Administered 2018-10-29 – 2018-10-31 (×2): 80 mg via ORAL
  Filled 2018-10-29 (×3): qty 1

## 2018-10-29 MED ORDER — GABAPENTIN 300 MG PO CAPS
300.0000 mg | ORAL_CAPSULE | Freq: Three times a day (TID) | ORAL | Status: DC
  Administered 2018-10-29 – 2018-10-31 (×6): 300 mg via ORAL
  Filled 2018-10-29 (×6): qty 1

## 2018-10-29 MED ORDER — MIRABEGRON ER 50 MG PO TB24
50.0000 mg | ORAL_TABLET | Freq: Every day | ORAL | Status: DC
  Administered 2018-10-29 – 2018-10-30 (×2): 50 mg via ORAL
  Filled 2018-10-29 (×3): qty 1

## 2018-10-29 MED ORDER — SODIUM CHLORIDE 0.9 % IV SOLN
INTRAVENOUS | Status: DC | PRN
  Administered 2018-10-29: 04:00:00 1000 mL via INTRAVENOUS

## 2018-10-29 MED ORDER — POLYETHYLENE GLYCOL 3350 17 G PO PACK
17.0000 g | PACK | Freq: Every day | ORAL | Status: DC
  Administered 2018-10-29 – 2018-10-30 (×2): 17 g via ORAL
  Filled 2018-10-29 (×3): qty 1

## 2018-10-29 MED ORDER — FOLIC ACID 1 MG PO TABS
1.0000 mg | ORAL_TABLET | Freq: Every day | ORAL | Status: DC
  Administered 2018-10-29 – 2018-10-31 (×3): 1 mg via ORAL
  Filled 2018-10-29 (×3): qty 1

## 2018-10-29 MED ORDER — ALPRAZOLAM 1 MG PO TABS
1.0000 mg | ORAL_TABLET | Freq: Every day | ORAL | Status: DC
  Administered 2018-10-29 – 2018-10-30 (×2): 1 mg via ORAL
  Filled 2018-10-29 (×3): qty 1

## 2018-10-29 MED ORDER — CLOPIDOGREL BISULFATE 75 MG PO TABS
75.0000 mg | ORAL_TABLET | Freq: Every day | ORAL | Status: DC
  Administered 2018-10-29 – 2018-10-31 (×3): 75 mg via ORAL
  Filled 2018-10-29 (×3): qty 1

## 2018-10-29 NOTE — Plan of Care (Signed)
  Problem: Education: Goal: Knowledge of General Education information will improve Description Including pain rating scale, medication(s)/side effects and non-pharmacologic comfort measures Outcome: Progressing   Problem: Health Behavior/Discharge Planning: Goal: Ability to manage health-related needs will improve Outcome: Progressing   Problem: Clinical Measurements: Goal: Ability to maintain clinical measurements within normal limits will improve Outcome: Progressing Goal: Will remain free from infection Outcome: Progressing Goal: Diagnostic test results will improve Outcome: Progressing Goal: Respiratory complications will improve Outcome: Progressing Goal: Cardiovascular complication will be avoided Outcome: Progressing   Problem: Activity: Goal: Risk for activity intolerance will decrease Outcome: Progressing   Problem: Coping: Goal: Level of anxiety will decrease Outcome: Progressing   Problem: Pain Managment: Goal: General experience of comfort will improve Outcome: Progressing   Problem: Safety: Goal: Ability to remain free from injury will improve Outcome: Progressing   Problem: Skin Integrity: Goal: Risk for impaired skin integrity will decrease Outcome: Progressing   

## 2018-10-29 NOTE — Care Management Obs Status (Signed)
[place] NOTIFICATION   Patient Details  Name: ALYX GEE MRN: [id number] Date of Birth: [birth year]   Medicare Observation Status Notification Given:  Yes    Shelbie Ammons, RN 10/29/2018, 9:28 AM

## 2018-10-29 NOTE — Evaluation (Signed)
Physical Therapy Evaluation Patient Details Name: [name] MRN: [id number] DOB: 1934-01-18 Today's Date: 10/29/2018   History of Present Illness  84 year old male with multiple medical problems including chronic hyponatremia secondary to SIADH, CAD, hypertension, IBS, urinary retention with chronic Foley catheter, recent dental abscess on clindamycin presents to hospital secondary to weakness and nausea, hyponatremia, current Na+ 126.  Clinical Impression  Patient A&Ox4 at start of session, with mild complaints of abdominal pain/discomfort. Pt reports living in 2 story home with spouse and daughter, able to live on the first floor, several STE, currently has CGA for stair navigation safety. Ambulates in home with RW, expresses a desire to attempt a hemiwalker if appropriate. Needs assistance for ADLs, cath care, and assistance with IADLs.  The patient demonstrated bed mobility with close supervision, sit <> stand x3 during session from varying heights with CGA and RW. Ambulated ~42ft total with RW and CGA, very decreased gait velocity, flexed trunk, intermittently RW outside BOS. Pt expressed dizziness while transferring, vitals stable throughout session. Overall the patient demonstrated limitations in strength, ambulation, mobility, and other deficits listed below (see "PT problem list") and would benefit from skilled PT. Current recommendation is HHPT with supervision with mobility.     Follow Up Recommendations Home health PT    Equipment Recommendations  Other (comment);None recommended by PT(Pt reports having appropriate DME at home.)    Recommendations for Other Services       Precautions / Restrictions Precautions Precautions: Fall Restrictions Weight Bearing Restrictions: No      Mobility  Bed Mobility Overal bed mobility: Needs Assistance Bed Mobility: Supine to Sit     Supine to sit: Supervision        Transfers Overall transfer level: Needs assistance    Transfers: Sit to/from Stand Sit to Stand: Min guard            Ambulation/Gait Ambulation/Gait assistance: Min guard;Supervision Gait Distance (Feet): 20 Feet Assistive device: Rolling walker (2 wheeled)   Gait velocity: decreased   General Gait Details: flexed trunk, very decreased velocity, intermittently pushes RW outside BOS.  Stairs            Wheelchair Mobility    Modified Rankin (Stroke Patients Only)       Balance Overall balance assessment: Needs assistance Sitting-balance support: Feet supported Sitting balance-[name]: Good Sitting balance - Comments: pt able to sit EOB and scoot with cga/supervision     Standing balance-[name]: Fair                               Pertinent Vitals/Pain Pain Assessment: No/denies pain    Home Living Family/patient expects to be discharged to:: Private residence Living Arrangements: Spouse/significant other;Children Available Help at Discharge: Family Type of Home: House Home Access: Stairs to enter Entrance Stairs-Rails: Right;Left   Home Layout: Two level;Able to live on main level with bedroom/bathroom Home Equipment: Gilford Rile - 2 wheels;Cane - single point;Bedside commode;Shower seat      Prior Function Level of Independence: Needs assistance   Gait / Transfers Assistance Needed: mod I for household ambulation with RW  ADL's / Homemaking Assistance Needed: needs assistance dressing in/out of bathtub transfers, cath care, occasionally with getting out of bed.        Hand Dominance        Extremity/Trunk Assessment   Upper Extremity Assessment Upper Extremity Assessment: Generalized weakness    Lower Extremity Assessment Lower Extremity Assessment:  Generalized weakness    Cervical / Trunk Assessment Cervical / Trunk Assessment: Kyphotic  Communication   Communication: HOH  Cognition Arousal/Alertness: Awake/alert Behavior During Therapy: WFL for tasks  assessed/performed Overall Cognitive Status: Within Functional Limits for tasks assessed                                 General Comments: pt anxious, has lots of questions      General Comments      Exercises Other Exercises Other Exercises: pt demonstrated commode transfer with bedside commode in place in bathroom. minAx1 for toileting activities.    Assessment/Plan    PT Assessment Patient needs continued PT services  PT Problem List Decreased strength;Decreased range of motion;Decreased knowledge of use of DME;Decreased activity tolerance;Decreased balance;Decreased mobility       PT Treatment Interventions DME instruction;Balance training;Gait training;Neuromuscular re-education;Stair training;Functional mobility training;Patient/family education;Therapeutic activities;Therapeutic exercise    PT Goals (Current goals can be found in the Care Plan section)  Acute Rehab PT Goals Patient Stated Goal: Pt would like to improve ambulation PT Goal Formulation: With patient Time For Goal Achievement: 11/12/18 Potential to Achieve Goals: Good    Frequency Min 2X/week   Barriers to discharge        Co-evaluation               AM-PAC PT "6 Clicks" Mobility  Outcome Measure Help needed turning from your back to your side while in a flat bed without using bedrails?: A Little Help needed moving from lying on your back to sitting on the side of a flat bed without using bedrails?: A Little Help needed moving to and from a bed to a chair (including a wheelchair)?: A Little Help needed standing up from a chair using your arms (e.g., wheelchair or bedside chair)?: A Little Help needed to walk in hospital room?: A Little Help needed climbing 3-5 steps with a railing? : A Lot 6 Click Score: 17    End of Session Equipment Utilized During Treatment: Gait belt Activity Tolerance: Patient tolerated treatment well Patient left: with chair alarm set;in chair;with call  bell/phone within reach;with nursing/sitter in room Nurse Communication: Mobility status PT Visit Diagnosis: Unsteadiness on feet (R26.81);Other abnormalities of gait and mobility (R26.89);Difficulty in walking, not elsewhere classified (R26.2);Muscle weakness (generalized) (M62.81)    Time: 5374-8270 PT Time Calculation (min) (ACUTE ONLY): 39 min   Charges:   PT Evaluation $PT Eval Moderate Complexity: 1 Mod PT Treatments $Therapeutic Activity: 23-37 mins        Lieutenant Diego PT, DPT 3:06 PM,10/29/18 [phone]

## 2018-10-29 NOTE — Progress Notes (Signed)
Pt very anxious.  He is concerned that his sodium has not increased.  Called and discussed with Dr Jannifer Franklin. Per Dr Jannifer Franklin, sodium will rise slowly with fluid restriction, given saline in Er that transiently raised sodium and expected to drop again. Per note nephrology to be consult but no order. Order entered for nephrology to see pt. Additional treatment for hyponatremia per nephrology. Pt given handout on hyponatremia and NIADH. Dorna Bloom RN

## 2018-10-29 NOTE — Progress Notes (Signed)
[place] at [place] NAME: [name]    MR#:  [id number]  DATE OF BIRTH:  March 05, 1934  SUBJECTIVE:  CHIEF COMPLAINT:   Chief Complaint  Patient presents with  . Emesis    - patient is alert and oriented - has several concerns- addressed today - last sodium at 126  REVIEW OF SYSTEMS:  Review of Systems  Constitutional: Negative for chills and fever.  Respiratory: Negative for cough, shortness of breath and wheezing.   Cardiovascular: Negative for chest pain and palpitations.  Gastrointestinal: Negative for abdominal pain, constipation, diarrhea, nausea and vomiting.  Genitourinary: Negative for dysuria.  Neurological: Negative for dizziness, seizures and headaches.    DRUG ALLERGIES:   Allergies  Allergen Reactions  . Ciprofloxacin Nausea And Vomiting    Headache  . Citalopram Hydrobromide Other (See Comments)    "unknown"  . Lorazepam     Adverse reaction  . Paroxetine Nausea Only  . Ramipril Other (See Comments)    "unknown"  . Simvastatin   . Sulfa Antibiotics Other (See Comments)    VITALS:  Blood pressure (!) 135/53, pulse 72, temperature 97.7 F (36.5 C), temperature source Oral, resp. rate 18, height 5\' 10"  (1.778 m), weight 90.7 kg, SpO2 99 %.  PHYSICAL EXAMINATION:  Physical Exam   GENERAL:  84 y.o.-year-old elderly patient lying in the bed with no acute distress.  EYES: Pupils equal, round, reactive to light and accommodation. No scleral icterus. Extraocular muscles intact.  HEENT: Head atraumatic, normocephalic. Oropharynx and nasopharynx clear.  NECK:  Supple, no jugular venous distention. No thyroid enlargement, no tenderness.  LUNGS: Normal breath sounds bilaterally, no wheezing, rales,rhonchi or crepitation. No use of accessory muscles of respiration. Decreased bibasilar breath sounds CARDIOVASCULAR: S1, S2 normal. No  rubs, or gallops.2/6 systolic murmur present  ABDOMEN: Soft, nontender,  nondistended. Bowel sounds present. No organomegaly or mass.  Chronic foley catheter present EXTREMITIES: No pedal edema, cyanosis, or clubbing.  NEUROLOGIC: Cranial nerves II through XII are intact. Muscle strength 5/5 in all extremities. Sensation intact. Gait not checked. Global weakness noted. PSYCHIATRIC: The patient is alert and oriented x 3.  SKIN: No obvious rash, lesion, or ulcer.    LABORATORY PANEL:   CBC Recent Labs  Lab 10/29/18 0320  WBC 6.3  HGB 12.8*  HCT 35.3*  PLT 200   ------------------------------------------------------------------------------------------------------------------  Chemistries  Recent Labs  Lab 10/28/18 1816 10/29/18 0320  NA 124* 126*  K 3.8 3.8  CL 89* 93*  CO2 26 25  GLUCOSE 134* 100*  BUN 14 14  CREATININE 0.50* 0.43*  CALCIUM 8.7* 8.5*  AST 23  --   ALT 15  --   ALKPHOS 60  --   BILITOT 0.7  --    ------------------------------------------------------------------------------------------------------------------  Cardiac Enzymes No results for input(s): TROPONINI in the last 168 hours. ------------------------------------------------------------------------------------------------------------------  RADIOLOGY:  No results found.  EKG:   Orders placed or performed during the hospital encounter of 10/28/18  . ED EKG  . ED EKG    ASSESSMENT AND PLAN:   84 year old male with multiple medical problems including chronic hyponatremia secondary to SIADH, CAD, hypertension, IBS, urinary retention with chronic Foley catheter, recent dental abscess on clindamycin presents to hospital secondary to weakness and nausea.  1.  Hyponatremia-acute on chronic -Could have been from persistent nausea vomiting and GI losses. -Received IV fluids yesterday in ED.  Sodium at 126 now. -Repeat sodium check ordered.  If continues to drop-will start  IV fluids. -Nephrology consult requested to manage his sodium as patient has chronic  hyponatremia and is on fluid restriction and is followed by nephrology as outpatient.  2.  Urinary tract infection-patient is afebrile, does not have any symptoms from his UTI as his weakness could be from low sodium. -His catheter is due to be changed next week and patient insists on not changing it during the hospitalization. -Urine appears very clear.  This could be colonization from his chronic catheter. -Discontinue meropenem  3.  IBS with constipation-continue home medications  4.  Neuropathy-on gabapentin  5.  DVT prophylaxis-Lovenox  Physical therapy consult     All the records are reviewed and case discussed with Care Management/Social Workerr. Management plans discussed with the patient, family and they are in agreement.  CODE STATUS: Full Code  TOTAL TIME TAKING CARE OF THIS PATIENT: 38 minutes.   POSSIBLE D/C IN 1-2 DAYS, DEPENDING ON CLINICAL CONDITION.   Gladstone Lighter M.D on 10/29/2018 at 1:22 PM  Between 7am to 6pm - Pager - [phone]  After 6pm go to [email or web address] - password [email or web address] [facility]  Office  [phone]  CC: Primary care physician; [name], MD

## 2018-10-29 NOTE — Care Management Note (Signed)
Case Management Note  Patient Details  Name: IDRISS QUACKENBUSH MRN: [id number] Date of Birth: Jan 01, 1934  Subjective/Objective:   Admitted to [facility] under observation status with the diagnosis of hyponatremia, Lives with wife, Antony Haste [phone]) and daughter. Seen Dr. Silvio Pate in October 2019. Prescriptions are filled at [facility].     [facility] in the past. [place] x 2 in the past. No home oxygen. Takes care of all basic activities of daily living himself, doesn't drive. Daughter helps with errands. Rolling walker and cane in the home. Last fall was June 2019. Good appetite. Daughter will transport.            Action/Plan:  Will continue to follow for transition of care plans.   Expected Discharge Date:                  Expected Discharge Plan:     In-House Referral:   yes  Discharge planning Services   yes  Post Acute Care Choice:    Choice offered to:     DME Arranged:    DME Agency:     HH Arranged:    HH Agency:     Status of Service:     If discussed at [employer] of Stay Meetings, dates discussed:    Additional Comments:  Shelbie Ammons, RN MSN CCM Care Management [phone] 10/29/2018, 9:55 AM

## 2018-10-30 DIAGNOSIS — I10 Essential (primary) hypertension: Secondary | ICD-10-CM | POA: Diagnosis not present

## 2018-10-30 DIAGNOSIS — E871 Hypo-osmolality and hyponatremia: Secondary | ICD-10-CM | POA: Diagnosis not present

## 2018-10-30 DIAGNOSIS — N390 Urinary tract infection, site not specified: Secondary | ICD-10-CM | POA: Diagnosis not present

## 2018-10-30 DIAGNOSIS — D649 Anemia, unspecified: Secondary | ICD-10-CM | POA: Diagnosis not present

## 2018-10-30 DIAGNOSIS — R600 Localized edema: Secondary | ICD-10-CM | POA: Diagnosis not present

## 2018-10-30 DIAGNOSIS — F411 Generalized anxiety disorder: Secondary | ICD-10-CM | POA: Diagnosis not present

## 2018-10-30 LAB — BASIC METABOLIC PANEL
Anion gap: 5 (ref 5–15)
BUN: 12 mg/dL (ref 8–23)
CO2: 28 mmol/L (ref 22–32)
Calcium: 8.5 mg/dL — ABNORMAL LOW (ref 8.9–10.3)
Chloride: 95 mmol/L — ABNORMAL LOW (ref 98–111)
Creatinine, Ser: 0.5 mg/dL — ABNORMAL LOW (ref 0.61–1.24)
GFR calc Af Amer: >60 mL/min (ref >60)
GFR calc non Af Amer: >60 mL/min (ref >60)
Glucose, Bld: 95 mg/dL (ref 70–99)
Potassium: 4.4 mmol/L (ref 3.5–5.1)
SODIUM: 128 mmol/L — AB (ref 135–145)

## 2018-10-30 LAB — SODIUM: SODIUM: 128 mmol/L — AB (ref 135–145)

## 2018-10-30 MED ORDER — METHOTREXATE 2.5 MG PO TABS
25.0000 mg | ORAL_TABLET | ORAL | Status: DC
  Administered 2018-10-31: 10:00:00 25 mg via ORAL
  Filled 2018-10-30: qty 10

## 2018-10-30 MED ORDER — LINACLOTIDE 145 MCG PO CAPS
145.0000 ug | ORAL_CAPSULE | Freq: Every day | ORAL | Status: DC
  Administered 2018-10-31: 10:00:00 145 ug via ORAL
  Filled 2018-10-30: qty 1

## 2018-10-30 MED ORDER — SODIUM CHLORIDE 1 G PO TABS
1.0000 g | ORAL_TABLET | Freq: Two times a day (BID) | ORAL | Status: DC
  Administered 2018-10-30 – 2018-10-31 (×2): 1 g via ORAL
  Filled 2018-10-30 (×3): qty 1

## 2018-10-30 MED ORDER — FUROSEMIDE 20 MG PO TABS
20.0000 mg | ORAL_TABLET | Freq: Once | ORAL | Status: AC
  Administered 2018-10-30: 20 mg via ORAL
  Filled 2018-10-30: qty 1

## 2018-10-30 NOTE — Progress Notes (Signed)
[facility] NAME: [name]    MR#:  [id number]  DATE OF BIRTH:  [birth year]  SUBJECTIVE:  CHIEF COMPLAINT:   Chief Complaint  Patient presents with  . Emesis    -Doing better, sodium at 128 today.  REVIEW OF SYSTEMS:  Review of Systems  Constitutional: Positive for malaise/fatigue. Negative for chills and fever.  Eyes: Negative for blurred vision and double vision.  Respiratory: Negative for cough, shortness of breath and wheezing.   Cardiovascular: Negative for chest pain and palpitations.  Gastrointestinal: Negative for abdominal pain, constipation, diarrhea, nausea and vomiting.  Genitourinary: Negative for dysuria.  Neurological: Negative for dizziness, focal weakness, seizures, weakness and headaches.  Psychiatric/Behavioral: Negative for depression.    DRUG ALLERGIES:   Allergies  Allergen Reactions  . Ciprofloxacin Nausea And Vomiting    Headache  . Citalopram Hydrobromide Other (See Comments)    "unknown"  . Lorazepam     Adverse reaction  . Paroxetine Nausea Only  . Ramipril Other (See Comments)    "unknown"  . Simvastatin   . Sulfa Antibiotics Other (See Comments)    VITALS:  Blood pressure (!) 109/58, pulse 92, temperature 97.7 F (36.5 C), temperature source Oral, resp. rate 20, height 5\' 10"  (1.778 m), weight 90.7 kg, SpO2 96 %.  PHYSICAL EXAMINATION:  Physical Exam   GENERAL:  84 y.o.-year-old elderly patient lying in the bed with no acute distress.  EYES: Pupils equal, round, reactive to light and accommodation. No scleral icterus. Extraocular muscles intact.  HEENT: Head atraumatic, normocephalic. Oropharynx and nasopharynx clear.  NECK:  Supple, no jugular venous distention. No thyroid enlargement, no tenderness.  LUNGS: Normal breath sounds bilaterally, no wheezing, rales,rhonchi or crepitation. No use of accessory muscles of respiration. Decreased bibasilar breath sounds CARDIOVASCULAR:  S1, S2 normal. No  rubs, or gallops.2/6 systolic murmur present  ABDOMEN: Soft, nontender, nondistended. Bowel sounds present. No organomegaly or mass.  Chronic foley catheter present EXTREMITIES: No pedal edema, cyanosis, or clubbing.  NEUROLOGIC: Cranial nerves II through XII are intact. Muscle strength 5/5 in all extremities. Sensation intact. Gait not checked. Global weakness noted. PSYCHIATRIC: The patient is alert and oriented x 3.  SKIN: No obvious rash, lesion, or ulcer.    LABORATORY PANEL:   CBC Recent Labs  Lab 10/29/18 0320  WBC 6.3  HGB 12.8*  HCT 35.3*  PLT 200   ------------------------------------------------------------------------------------------------------------------  Chemistries  Recent Labs  Lab 10/28/18 1816  10/30/18 0413  NA 124*   < > 128*  K 3.8   < > 4.4  CL 89*   < > 95*  CO2 26   < > 28  GLUCOSE 134*   < > 95  BUN 14   < > 12  CREATININE 0.50*   < > 0.50*  CALCIUM 8.7*   < > 8.5*  AST 23  --   --   ALT 15  --   --   ALKPHOS 60  --   --   BILITOT 0.7  --   --    < > = values in this interval not displayed.   ------------------------------------------------------------------------------------------------------------------  Cardiac Enzymes No results for input(s): TROPONINI in the last 168 hours. ------------------------------------------------------------------------------------------------------------------  RADIOLOGY:  No results found.  EKG:   Orders placed or performed during the hospital encounter of 10/28/18  . ED EKG  . ED EKG    ASSESSMENT AND PLAN:   84 year old male with multiple medical problems  including chronic hyponatremia secondary to SIADH, CAD, hypertension, IBS, urinary retention with chronic Foley catheter, recent dental abscess on clindamycin presents to hospital secondary to weakness and nausea.  1.  Hyponatremia-acute on chronic -Could have been from persistent nausea vomiting and GI losses.  Also has  SIADH. -Improving with fluid restriction.  Appreciate nephrology consult. -No IV fluids.  Continue fluid restriction but decrease it to 1300 cc/h, Lasix and salt tablets added today.  2.  Urinary tract infection-patient is afebrile, does not have any symptoms from his UTI as his weakness could be from low sodium. -Improving with sodium improving. -His catheter is due to be changed next week and patient insists on not changing it during the hospitalization. -Urine appears very clear.  This could be colonization from his chronic catheter. -Discontinued meropenem  3.  IBS with constipation-continue home medications  4.  Neuropathy-on gabapentin  5.  DVT prophylaxis-Lovenox  Physical therapy consulted-needs home health at discharge     All the records are reviewed and case discussed with Care Management/Social Workerr. Management plans discussed with the patient, family and they are in agreement.  CODE STATUS: Full Code  TOTAL TIME TAKING CARE OF THIS PATIENT: 28 minutes.   POSSIBLE D/C IN 1-2 DAYS, DEPENDING ON CLINICAL CONDITION.   Gladstone Lighter M.D on 10/30/2018 at 2:33 PM  Between 7am to 6pm - Pager - [phone]  After 6pm go to [email or web address] - password [email or web address] [facility]  Office  [phone]  CC: Primary care physician; [name], MD

## 2018-10-30 NOTE — Progress Notes (Signed)
Pt very anxious.  He is concerned that his sodium has not increased.  Called and discussed with Dr Jannifer Franklin. Consult for Nephrology ordered as not done during the day but in the attending's note.  Per Dr Jannifer Franklin, sodium transiently raised because pt given Saline in ER and it will take awhile for sodium to rise with fluid restriction.  Nephrology to make decision on additional treatment. Dorna Bloom RN

## 2018-10-30 NOTE — Progress Notes (Signed)
Initial Nutrition Assessment  DOCUMENTATION CODES:   Not applicable  INTERVENTION:  Encouraged adequate intake of protein at meals. Discussed foods that contain protein.  NUTRITION DIAGNOSIS:   Inadequate oral intake related to (nausea and vomiting prior to admission) as evidenced by per patient/family report.  GOAL:   Patient will meet greater than or equal to 90% of their needs  MONITOR:   PO intake, Labs, Weight trends, I & O's  REASON FOR ASSESSMENT:   Malnutrition Screening Tool    ASSESSMENT:   84 year old male with PMHx of CAD, asthma, diverticulitis, GERD, arthritis, prostate cancer s/p prostate surgery, IBS, anxiety, OSA, HTN, depression, SIADH, urinary retention with chronic Foley catheter, recent dental abscesses on clindamycin who presented with weakness and nausea found to have acute on chronic hyponatremia, UTI.   Met with patient at bedside. He reports a decreased appetite and intake for one week PTA. He had two dental abscesses and was put on doxycycline and then later switched to clindamycin. He developed nausea and vomiting and could not eat much. He also experiences IBS-C and has not had a BM in several days. He reports his appetite is much better now and has returned to baseline. He is no longer experiencing nausea. He is finishing 100% of meals and is choosing a good source of protein at each meal. Patient is very knowledgeable about his fluid restriction. He is aware that MD lowered fluid restriction to 1300 mL today. He is not ordering any fluids on his tray. He is only drinking water from the large hospital mug that has the measurements so he can tell how much fluid he can have. He does not feel like he needs any protein supplements at this time since he is eating so well. We did discuss the option of protein bars if his appetite decreases again at home and he wants an option that is low in fluid.  UBW 200 lbs. Patient believes he has lost about 5 lbs (2.5%  body weight) over the past week. There is no true weight taken so far this admission so unable to trend. Patient was sitting in chair so RD zeroed bed scale so he can be weighed when he gets back in the bed later.   Meal Completion: 100%  Medications reviewed and include: acidophilus 2 capsules daily, Xanax 1 mg QHS, folic acid 1 mg daily, gabapentin, methotrexate 25 mg weekly, pantoprazole, Miralax.  Labs reviewed: Sodium 128, Chloride 95, Creatinine 0.5.  Patient does not meet criteria for malnutrition at this time.  Discussed with RN.  NUTRITION - FOCUSED PHYSICAL EXAM:    Most Recent Value  Orbital Region  No depletion  Upper Arm Region  Unable to assess [wearing turtleneck]  Thoracic and Lumbar Region  Unable to assess [wearing turtleneck]  Buccal Region  No depletion  Temple Region  No depletion  Clavicle Bone Region  No depletion  Clavicle and Acromion Bone Region  Unable to assess [wearing turtleneck]  Scapular Bone Region  Unable to assess [wearing turtleneck]  Dorsal Hand  No depletion  Patellar Region  Mild depletion  Anterior Thigh Region  Mild depletion  Posterior Calf Region  No depletion  Edema (RD Assessment)  Mild  Hair  Reviewed  Eyes  Reviewed  Mouth  Reviewed  Skin  Reviewed [ecchymosis]  Nails  Reviewed     Diet Order:   Diet Order            Diet Heart Room service appropriate? Yes; Fluid  consistency: Thin; Fluid restriction: Other (see comments)  Diet effective now             EDUCATION NEEDS:   Education needs have been addressed  Skin:  Skin Assessment: Reviewed RN Assessment(generalized ecchymosis)  Last BM:  10/28/2018  Height:   Ht Readings from Last 1 Encounters:  10/28/18 _0  (1.778 m)   Weight:   Wt Readings from Last 1 Encounters:  10/28/18 90.7 kg   Ideal Body Weight:  75.5 kg  BMI:  Body mass index is 28.7 kg/m.  Estimated Nutritional Needs:   Kcal:  1900-2100  Protein:  95-105 grams  Fluid:  1.3 L/day per  fluid restriction  Willey Blade, MS, RD, LDN Office: [phone] Pager: [phone] After Hours/Weekend Pager: [phone]

## 2018-10-30 NOTE — Progress Notes (Signed)
[facility]  ROUNDING NOTE   Subjective:  Patient well-known to [place]. Had recent dental abscess and was treated with clindamycin. Thereafter he developed nausea, vomiting, and anorexia. Upon admission serum sodium was low at 124. He was started on IV fluid hydration and serum sodium repeat at 126. IV fluids were then stop. Serum sodium of 128 this a.m.He has known SIADH.   Objective:  Vital signs in last 24 hours:  Temp:  [97.5 F (36.4 C)-98.1 F (36.7 C)] 97.7 F (36.5 C) (12/03 1158) Pulse Rate:  [67-92] 92 (12/03 1158) Resp:  [18-20] 20 (12/03 1158) BP: (109-160)/(57-66) 109/58 (12/03 1158) SpO2:  [94 %-96 %] 96 % (12/03 1158)  Weight change:  Filed Weights   10/28/18 1807  Weight: 90.7 kg    Intake/Output: I/O last 3 completed shifts: In: 2616 [P.O.:1500; I.V.:1010.7; IV Piggyback:105.3] Out: 800 [Urine:800]   Intake/Output this shift:  Total I/O In: -  Out: 200 [Urine:200]  Physical Exam: General: No acute distress  Head: Normocephalic, atraumatic. Moist oral mucosal membranes  Eyes: Anicteric  Neck: Supple, trachea midline  Lungs:  Clear to auscultation, normal effort  Heart: S1S2 no rubs  Abdomen:  Soft, nontender, bowel sounds present  Extremities: 1+ peripheral edema.  Neurologic: Awake, alert, following commands  Skin: No lesions  GU: Foley in place    Basic Metabolic Panel: Recent Labs  Lab 10/28/18 1816 10/29/18 0320 10/29/18 1322 10/29/18 1959 10/30/18 0413  NA 124* 126* 126* 124* 128*  K 3.8 3.8  --   --  4.4  CL 89* 93*  --   --  95*  CO2 26 25  --   --  28  GLUCOSE 134* 100*  --   --  95  BUN 14 14  --   --  12  CREATININE 0.50* 0.43*  --   --  0.50*  CALCIUM 8.7* 8.5*  --   --  8.5*    Liver Function Tests: Recent Labs  Lab 10/28/18 1816  AST 23  ALT 15  ALKPHOS 60  BILITOT 0.7  PROT 6.3*  ALBUMIN 4.0   Recent Labs  Lab 10/28/18 1816  LIPASE 50   No results for input(s): AMMONIA in the last 168  hours.  CBC: Recent Labs  Lab 10/28/18 1816 10/29/18 0320  WBC 7.2 6.3  HGB 14.2 12.8*  HCT 38.7* 35.3*  MCV 89.0 89.6  PLT 251 200    Cardiac Enzymes: No results for input(s): CKTOTAL, CKMB, CKMBINDEX, TROPONINI in the last 168 hours.  BNP: Invalid input(s): POCBNP  CBG: No results for input(s): GLUCAP in the last 168 hours.  Microbiology: Results for orders placed or performed during the hospital encounter of 10/28/18  Urine Culture     Status: Abnormal (Preliminary result)   Collection Time: 10/28/18  9:24 PM  Result Value Ref Range Status   Specimen Description   Final    URINE, RANDOM Performed at [facility], [address] [place], [place] [address]    Special Requests   Final    NONE Performed at [facility], [address] [place], [place] [address]    Culture (A)  Final    >=100,000 COLONIES/mL GRAM NEGATIVE RODS CULTURE REINCUBATED FOR BETTER GROWTH Performed at [facility], [place] [address], [place], [place] [address]    Report Status PENDING  Incomplete    Coagulation Studies: No results for input(s): LABPROT, INR in the last 72 hours.  Urinalysis: Recent Labs  10/28/18 2124  COLORURINE YELLOW*  LABSPEC 1.004*  PHURINE 7.0  GLUCOSEU NEGATIVE  HGBUR MODERATE*  BILIRUBINUR NEGATIVE  KETONESUR NEGATIVE  PROTEINUR 30*  NITRITE POSITIVE*  LEUKOCYTESUR LARGE*      Imaging: No results found.   Medications:   . sodium chloride Stopped (10/29/18 0513)   . acidophilus  2 capsule Oral Daily  . ALPRAZolam  1 mg Oral QHS  . clopidogrel  75 mg Oral Daily  . enoxaparin (LOVENOX) injection  40 mg Subcutaneous Q24H  . folic acid  1 mg Oral Daily  . gabapentin  300 mg Oral TID  . hydroxychloroquine  200 mg Oral BID  . mirabegron ER  50 mg Oral QHS  . pantoprazole  40 mg Oral Daily  . polyethylene glycol  17 g Oral Daily  . pravastatin  20 mg Oral Daily  . sodium chloride  1 g Oral BID WC   sodium chloride,  acetaminophen **OR** acetaminophen, bisacodyl, ondansetron **OR** ondansetron (ZOFRAN) IV, senna-docusate, simethicone, traZODone  Assessment/ Plan:  84 y.o. male with COPD due to long standing asthma, multiple episodes of SIADH, HTN, prostate cancer, hyperlipidemia, inflammatory arthritis, episode of altered mental status due to acute hyponatremia  1.  Hyponatremia due to long standing SIADH. 2.  Lower extremity edema.  3.  Anemia unspecified. 4.  Chronic foley status.   Plan:  Patient very well known to [place] as an outpatient.  His most recent serum sodium as an outpatient was 134.  Agree with discontinuation of IV fluids.  We have added back sodium chloride 1 g by mouth twice a day and will administer furosemide 20 mg by mouth x1 today.  We've also changed fluid restriction to 1300 mL total protein 24 hours. This regimen should help to bring his sodium to more normal level.  Will monitor along with you.    LOS: 0 Raeanna Soberanes 12/3/201912:17 PM

## 2018-10-30 NOTE — Progress Notes (Signed)
Physical Therapy Treatment Patient Details Name: [name] MRN: [id number] DOB: 1934/03/14 Today's Date: 10/30/2018    History of Present Illness 84 year old male with multiple medical problems including chronic hyponatremia secondary to SIADH, CAD, hypertension, IBS, urinary retention with chronic Foley catheter, recent dental abscess on clindamycin presents to hospital secondary to weakness and nausea, hyponatremia, current Na+ 128.    PT Comments    Patient alert and agreeable to PT at start of session, with mild complaints of bilateral knee stiffness. Pt mobilized to EOB with close supervision, sit<> stand with RW and minAx1. Ambulated ~25ft to bathroom with RW and CGA, left with nursing staff in room due to nursing care/changing/foley care needed. Pt educated about importance of attempting to sit in recliner instead of bed, agreeable to eat lunch in chair with nursing. The patient would benefit from further skilled PT to continue to progress towards goals to maximize safety, mobility, and independence.     Follow Up Recommendations  Home health PT;Supervision for mobility/OOB     Equipment Recommendations  Other (comment);None recommended by PT    Recommendations for Other Services       Precautions / Restrictions Precautions Precautions: Fall Restrictions Weight Bearing Restrictions: No    Mobility  Bed Mobility Overal bed mobility: Needs Assistance Bed Mobility: Supine to Sit     Supine to sit: Supervision        Transfers Overall transfer level: Needs assistance   Transfers: Sit to/from Stand Sit to Stand: Min assist         General transfer comment: Pt complained of bilateral knee stiffness from laying in bed. minAx1 to achieve fully standing position  Ambulation/Gait Ambulation/Gait assistance: Min guard Gait Distance (Feet): 20 Feet Assistive device: Rolling walker (2 wheeled)   Gait velocity: decreased   General Gait Details: flexed trunk,  very decreased velocity, intermittently pushes RW outside BOS.   Stairs             Wheelchair Mobility    Modified Rankin (Stroke Patients Only)       Balance Overall balance assessment: Needs assistance Sitting-balance support: Feet supported Sitting balance-[name]: Good       Standing balance-[name]: Fair                              Cognition Arousal/Alertness: Awake/alert Behavior During Therapy: WFL for tasks assessed/performed Overall Cognitive Status: Within Functional Limits for tasks assessed                                 General Comments: pt anxious, has lots of questions      Exercises Other Exercises Other Exercises: Pt demonstrated commode transfer with bedside commode in place, verbal cues for hand placement on grab bars for safety. CGA.    General Comments        Pertinent Vitals/Pain Pain Assessment: No/denies pain    Home Living                      Prior Function            PT Goals (current goals can now be found in the care plan section) Progress towards PT goals: Progressing toward goals    Frequency    Min 2X/week      PT Plan Current plan remains appropriate    Co-evaluation  AM-PAC PT "6 Clicks" Mobility   Outcome Measure  Help needed turning from your back to your side while in a flat bed without using bedrails?: A Little Help needed moving from lying on your back to sitting on the side of a flat bed without using bedrails?: A Little Help needed moving to and from a bed to a chair (including a wheelchair)?: A Little Help needed standing up from a chair using your arms (e.g., wheelchair or bedside chair)?: A Little Help needed to walk in hospital room?: A Little Help needed climbing 3-5 steps with a railing? : A Lot 6 Click Score: 17    End of Session Equipment Utilized During Treatment: Gait belt Activity Tolerance: Patient tolerated treatment  well Patient left: Other (comment)(Pt on commode with nursing staff due to nursing care needed.) Nurse Communication: Mobility status PT Visit Diagnosis: Unsteadiness on feet (R26.81);Other abnormalities of gait and mobility (R26.89);Difficulty in walking, not elsewhere classified (R26.2);Muscle weakness (generalized) (M62.81)     Time: 4680-3212 PT Time Calculation (min) (ACUTE ONLY): 14 min  Charges:  $Therapeutic Activity: 8-22 mins                    Lieutenant Diego PT, DPT 11:52 AM,10/30/18 [phone]

## 2018-10-31 ENCOUNTER — Ambulatory Visit (INDEPENDENT_AMBULATORY_CARE_PROVIDER_SITE_OTHER): Payer: Medicare PPO

## 2018-10-31 DIAGNOSIS — I10 Essential (primary) hypertension: Secondary | ICD-10-CM | POA: Diagnosis not present

## 2018-10-31 DIAGNOSIS — N319 Neuromuscular dysfunction of bladder, unspecified: Secondary | ICD-10-CM

## 2018-10-31 DIAGNOSIS — R600 Localized edema: Secondary | ICD-10-CM | POA: Diagnosis not present

## 2018-10-31 DIAGNOSIS — N390 Urinary tract infection, site not specified: Secondary | ICD-10-CM | POA: Diagnosis not present

## 2018-10-31 DIAGNOSIS — D649 Anemia, unspecified: Secondary | ICD-10-CM | POA: Diagnosis not present

## 2018-10-31 DIAGNOSIS — F411 Generalized anxiety disorder: Secondary | ICD-10-CM | POA: Diagnosis not present

## 2018-10-31 DIAGNOSIS — E871 Hypo-osmolality and hyponatremia: Secondary | ICD-10-CM | POA: Diagnosis not present

## 2018-10-31 LAB — BASIC METABOLIC PANEL
ANION GAP: 5 (ref 5–15)
BUN: 19 mg/dL (ref 8–23)
CO2: 29 mmol/L (ref 22–32)
Calcium: 8.4 mg/dL — ABNORMAL LOW (ref 8.9–10.3)
Chloride: 94 mmol/L — ABNORMAL LOW (ref 98–111)
Creatinine, Ser: 0.57 mg/dL — ABNORMAL LOW (ref 0.61–1.24)
GFR calc Af Amer: >60 mL/min (ref >60)
GFR calc non Af Amer: >60 mL/min (ref >60)
Glucose, Bld: 91 mg/dL (ref 70–99)
Potassium: 4.3 mmol/L (ref 3.5–5.1)
Sodium: 128 mmol/L — ABNORMAL LOW (ref 135–145)

## 2018-10-31 MED ORDER — FUROSEMIDE 20 MG PO TABS
20.0000 mg | ORAL_TABLET | Freq: Every day | ORAL | Status: DC
  Filled 2018-10-31: qty 1

## 2018-10-31 MED ORDER — SODIUM CHLORIDE 1 G PO TABS: 1.0000 g | ORAL_TABLET | Freq: Two times a day (BID) | ORAL | 1 refills | Status: DC

## 2018-10-31 MED ORDER — FUROSEMIDE 20 MG PO TABS: 20.0000 mg | ORAL_TABLET | Freq: Every day | ORAL | 1 refills | Status: DC

## 2018-10-31 NOTE — Discharge Summary (Signed)
[facility] NAME: Lucio Litsey    MR#:  [id number]  DATE OF BIRTH:  [birth year]  DATE OF ADMISSION:  10/28/2018   ADMITTING PHYSICIAN: Lance Coon, MD  DATE OF DISCHARGE:  10/31/18  PRIMARY CARE PHYSICIAN: [name], MD   ADMISSION DIAGNOSIS:   Hyponatremia [E87.1] Weakness [R53.1] Extrinsic asthma, moderate persistent, uncomplicated [U54.27]  DISCHARGE DIAGNOSIS:   Principal Problem:   Hyponatremia Active Problems:   GAD (generalized anxiety disorder)   Essential hypertension, benign   Coronary atherosclerosis of native coronary artery   GERD   Hyperlipemia   UTI (urinary tract infection)   SIADH (syndrome of inappropriate ADH production) ([place])   SECONDARY DIAGNOSIS:   Past Medical History:  Diagnosis Date  . Allergy   . Anemia   . Anxiety   . Arthritis    rheumatoid  . Asthma   . Basal cell carcinoma    back  . CAD (coronary artery disease)   . Cancer [facility])    Prostate  . Chronic sinusitis   . Depression   . Diverticulitis   . ED (erectile dysfunction)   . GERD (gastroesophageal reflux disease)   . History of SIADH   . Hx of adenomatous colonic polyps   . Hypertension   . Hyponatremia   . IBS (irritable bowel syndrome)   . Kidney lesion   . Neurodermatitis   . Neurogenic bladder   . OSA (obstructive sleep apnea)     HOSPITAL COURSE:   84 year old male with multiple medical problems including chronic hyponatremia secondary to SIADH, CAD, hypertension, IBS, urinary retention with chronic Foley catheter, recent dental abscess on clindamycin presents to hospital secondary to weakness and nausea.  1.  Hyponatremia-acute on chronic -Could have been from persistent nausea vomiting and GI losses.   has SIADH. -Initially received gentle fluids in the ED. -Now improving with fluid restriction.  Appreciate nephrology consult. -Lasix and salt tablets have been added.  Sodium stable at  128 -Strict fluid restriction to less than 1200 cc/ 24 hrs recommended at discharge  -Outpatient follow-up for sodium within a week  2.  Urinary tract infection-patient is afebrile, does not have any symptoms from his UTI as his weakness could be from low sodium. -Improving with sodium improving. -His catheter is due to be changed 11/05/18 and patient insists on not changing it during the hospitalization. -Urine appears very clear.    There is some leaking around the catheter.  The infected urine could be colonization from his chronic catheter. -Discontinued meropenem  3.  IBS with constipation-continue home medications  4.  Neuropathy-on gabapentin  Physical therapy consulted-needs home health at discharge  DISCHARGE CONDITIONS:   Guarded  CONSULTS OBTAINED:   Treatment Team:  Anthonette Legato, MD  DRUG ALLERGIES:   Allergies  Allergen Reactions  . Ciprofloxacin Nausea And Vomiting    Headache  . Citalopram Hydrobromide Other (See Comments)    "unknown"  . Lorazepam     Adverse reaction  . Paroxetine Nausea Only  . Ramipril Other (See Comments)    "unknown"  . Simvastatin   . Sulfa Antibiotics Other (See Comments)   DISCHARGE MEDICATIONS:   Allergies as of 10/31/2018      Reactions   Ciprofloxacin Nausea And Vomiting   Headache   Citalopram Hydrobromide Other (See Comments)   "unknown"   Lorazepam    Adverse reaction   Paroxetine Nausea Only   Ramipril Other (See Comments)   "  unknown"   Simvastatin    Sulfa Antibiotics Other (See Comments)      Medication List    STOP taking these medications   Plecanatide 3 MG Tabs     TAKE these medications   albuterol 108 (90 Base) MCG/ACT inhaler Commonly known as:  PROVENTIL HFA;VENTOLIN HFA Inhale 2 puffs into the lungs every 6 (six) hours as needed for wheezing or shortness of breath.   ALIGN 4 MG Caps Take 1 capsule (4 mg total) by mouth daily.   ALPRAZolam 0.5 MG tablet Commonly known as:   XANAX TAKE 1 TABLET BY MOUTH THREE TIMES DAILY AS NEEDED FOR ANXIETY OR SLEEP What changed:  See the new instructions.   clopidogrel 75 MG tablet Commonly known as:  PLAVIX Take 75 mg by mouth daily.   Co Q 10 100 MG Caps Take 1 capsule by mouth daily.   Cranberry 425 MG Caps Take 2 capsules by mouth daily.   fluticasone 50 MCG/ACT nasal spray Commonly known as:  FLONASE Place 2 sprays into both nostrils 2 (two) times daily.   folic acid 1 MG tablet Commonly known as:  FOLVITE Take 1 tablet (1 mg total) by mouth daily.   furosemide 20 MG tablet Commonly known as:  LASIX Take 1 tablet (20 mg total) by mouth daily. Start taking on:  11/01/2018   gabapentin 300 MG capsule Commonly known as:  NEURONTIN TAKE 1 CAPSULE BY MOUTH THREE TIMES DAILY   hydroxychloroquine 200 MG tablet Commonly known as:  PLAQUENIL Take 1 tablet by mouth 2 (two) times daily.   ibuprofen 400 MG tablet Commonly known as:  ADVIL,MOTRIN Take 400 mg by mouth every 6 (six) hours as needed for mild pain.   methotrexate 2.5 MG tablet Commonly known as:  RHEUMATREX Take 10 tablets by mouth once a week.   Milk Thistle 250 MG Caps Take 1 capsule by mouth daily.   multivitamin tablet Take 1 tablet by mouth daily.   MYRBETRIQ 50 MG Tb24 tablet Generic drug:  mirabegron ER Take 50 mg by mouth at bedtime.   nystatin powder Commonly known as:  MYCOSTATIN/NYSTOP Apply topically 4 (four) times daily. What changed:    when to take this  reasons to take this   omeprazole 20 MG capsule Commonly known as:  PRILOSEC TAKE 1 CAPSULE(20 MG) BY MOUTH DAILY AS NEEDED What changed:  See the new instructions.   ondansetron 4 MG tablet Commonly known as:  ZOFRAN Take 1 tablet (4 mg total) by mouth every 8 (eight) hours as needed for nausea or vomiting.   polyethylene glycol packet Commonly known as:  MIRALAX / GLYCOLAX Take 17 g by mouth at bedtime.   pravastatin 20 MG tablet Commonly known as:   PRAVACHOL TAKE 1 TABLET BY MOUTH EVERY DAY What changed:  when to take this   Selenium 200 MCG Caps Take 1 capsule by mouth daily.   sodium chloride 1 g tablet Take 1 tablet (1 g total) by mouth 2 (two) times daily with a meal.   Turmeric 500 MG Caps Take 500 mg by mouth daily.   vitamin B-12 500 MCG tablet Commonly known as:  CYANOCOBALAMIN Take 500 mcg by mouth daily.   Vitamin D3 125 MCG (5000 UT) Caps Take 1 capsule by mouth daily.        DISCHARGE INSTRUCTIONS:   1. PCP f/u in 1-2 weeks 2. Urology f/u in 2-3 days 3. Nephrology f/u for sodium check in 1 week 4. Fluid restriction  to less than 1200 cc/day  DIET:   Cardiac diet  ACTIVITY:   Activity as tolerated  OXYGEN:   Home Oxygen: No.  Oxygen Delivery: room air  DISCHARGE LOCATION:   home   If you experience worsening of your admission symptoms, develop shortness of breath, life threatening emergency, suicidal or homicidal thoughts you must seek medical attention immediately by calling 911 or calling your MD immediately  if symptoms less severe.  You Must read complete instructions/literature along with all the possible adverse reactions/side effects for all the Medicines you take and that have been prescribed to you. Take any new Medicines after you have completely understood and accpet all the possible adverse reactions/side effects.   Please note  You were cared for by a hospitalist during your hospital stay. If you have any questions about your discharge medications or the care you received while you were in the hospital after you are discharged, you can call the unit and asked to speak with the hospitalist on call if the hospitalist that took care of you is not available. Once you are discharged, your primary care physician will handle any further medical issues. Please note that NO REFILLS for any discharge medications will be authorized once you are discharged, as it is imperative that you return to  your primary care physician (or establish a relationship with a primary care physician if you do not have one) for your aftercare needs so that they can reassess your need for medications and monitor your lab values.    On the day of Discharge:  VITAL SIGNS:   Blood pressure (!) 124/51, pulse 72, temperature 97.8 F (36.6 C), temperature source Oral, resp. rate 17, height 5\' 10"  (1.778 m), weight 90.7 kg, SpO2 94 %.  PHYSICAL EXAMINATION:   GENERAL:  84 y.o.-year-old elderly patient lying in the bed with no acute distress.  EYES: Pupils equal, round, reactive to light and accommodation. No scleral icterus. Extraocular muscles intact.  HEENT: Head atraumatic, normocephalic. Oropharynx and nasopharynx clear.  NECK:  Supple, no jugular venous distention. No thyroid enlargement, no tenderness.  LUNGS: Normal breath sounds bilaterally, no wheezing, rales,rhonchi or crepitation. No use of accessory muscles of respiration. Decreased bibasilar breath sounds CARDIOVASCULAR: S1, S2 normal. No  rubs, or gallops.2/6 systolic murmur present  ABDOMEN: Soft, nontender, nondistended. Bowel sounds present. No organomegaly or mass.  Chronic foley catheter present EXTREMITIES: No pedal edema, cyanosis, or clubbing.  NEUROLOGIC: Cranial nerves II through XII are intact. Muscle strength 5/5 in all extremities. Sensation intact. Gait not checked. Global weakness noted. PSYCHIATRIC: The patient is alert and oriented x 3.  SKIN: No obvious rash, lesion, or ulcer.   DATA REVIEW:   CBC Recent Labs  Lab 10/29/18 0320  WBC 6.3  HGB 12.8*  HCT 35.3*  PLT 200    Chemistries  Recent Labs  Lab 10/28/18 1816  10/31/18 0458  NA 124*   < > 128*  K 3.8   < > 4.3  CL 89*   < > 94*  CO2 26   < > 29  GLUCOSE 134*   < > 91  BUN 14   < > 19  CREATININE 0.50*   < > 0.57*  CALCIUM 8.7*   < > 8.4*  AST 23  --   --   ALT 15  --   --   ALKPHOS 60  --   --   BILITOT 0.7  --   --    < > =  values in this  interval not displayed.     Microbiology Results  Results for orders placed or performed during the hospital encounter of 10/28/18  Urine Culture     Status: Abnormal (Preliminary result)   Collection Time: 10/28/18  9:24 PM  Result Value Ref Range Status   Specimen Description   Final    URINE, RANDOM Performed at [facility], [address] [place], [place] [address]    Special Requests   Final    NONE Performed at [facility], [address], [place], [place] [address]    Culture (A)  Final    >=100,000 COLONIES/mL GRAM NEGATIVE RODS CULTURE REINCUBATED FOR BETTER GROWTH Performed at [facility], [place] [address], [place], [place] [address]    Report Status PENDING  Incomplete    RADIOLOGY:  No results found.   Management plans discussed with the patient, family and they are in agreement.  CODE STATUS:     Code Status Orders  (From admission, onward)         Start     Ordered   10/28/18 2300  Full code  Continuous     10/28/18 2259        Code Status History    Date Active Date Inactive Code Status Order ID Comments User Context   05/25/2018 2236 05/26/2018 1653 Full Code [id number]  Lance Coon, MD Inpatient   10/18/2017 2238 10/22/2017 2015 Full Code [id number]  Gorden Harms, MD Inpatient   04/03/2017 0632 04/05/2017 1808 Full Code [id number]  Harrie Foreman, MD Inpatient   12/11/2016 2139 12/15/2016 1920 Full Code [id number]  Demetrios Loll, MD Inpatient   09/07/2015 1505 09/08/2015 1744 Full Code [id number]  Dustin Flock, MD Inpatient   09/02/2015 1716 09/05/2015 1652 Full Code [id number]  Hooten, Laurice Record, MD Inpatient      TOTAL TIME TAKING CARE OF THIS PATIENT: 38 minutes.    Gladstone Lighter M.D on 10/31/2018 at 11:21 AM  Between 7am to 6pm - Pager - [phone]  After 6pm go to [email or web address] - Proofreader  [facility]  Office  [phone]  CC: Primary care physician; [name], MD   Note: This dictation was prepared with Dragon dictation along with smaller phrase technology. Any transcriptional errors that result from this process are unintentional.

## 2018-10-31 NOTE — Progress Notes (Signed)
[facility]  ROUNDING NOTE   Subjective:  Serum sodium remained stable at 128. Patient counseled on fluid restriction, use of Lasix, and salt tablets at home.   Objective:  Vital signs in last 24 hours:  Temp:  [97.7 F (36.5 C)-98 F (36.7 C)] 97.8 F (36.6 C) (12/04 0504) Pulse Rate:  [72-92] 72 (12/04 0504) Resp:  [17-20] 17 (12/04 0504) BP: (109-124)/(51-58) 124/51 (12/04 0504) SpO2:  [94 %-100 %] 94 % (12/04 0504)  Weight change:  Filed Weights   10/28/18 1807  Weight: 90.7 kg    Intake/Output: I/O last 3 completed shifts: In: 1007.8 [P.O.:1000; I.V.:7.8] Out: 1200 [Urine:1200]   Intake/Output this shift:  No intake/output data recorded.  Physical Exam: General: No acute distress  Head: Normocephalic, atraumatic. Moist oral mucosal membranes  Eyes: Anicteric  Neck: Supple, trachea midline  Lungs:  Clear to auscultation, normal effort  Heart: S1S2 no rubs  Abdomen:  Soft, nontender, bowel sounds present  Extremities: 1+ peripheral edema.  Neurologic: Awake, alert, following commands  Skin: No lesions  GU: Foley in place    Basic Metabolic Panel: Recent Labs  Lab 10/28/18 1816 10/29/18 0320 10/29/18 1322 10/29/18 1959 10/30/18 0413 10/30/18 1517 10/31/18 0458  NA 124* 126* 126* 124* 128* 128* 128*  K 3.8 3.8  --   --  4.4  --  4.3  CL 89* 93*  --   --  95*  --  94*  CO2 26 25  --   --  28  --  29  GLUCOSE 134* 100*  --   --  95  --  91  BUN 14 14  --   --  12  --  19  CREATININE 0.50* 0.43*  --   --  0.50*  --  0.57*  CALCIUM 8.7* 8.5*  --   --  8.5*  --  8.4*    Liver Function Tests: Recent Labs  Lab 10/28/18 1816  AST 23  ALT 15  ALKPHOS 60  BILITOT 0.7  PROT 6.3*  ALBUMIN 4.0   Recent Labs  Lab 10/28/18 1816  LIPASE 50   No results for input(s): AMMONIA in the last 168 hours.  CBC: Recent Labs  Lab 10/28/18 1816 10/29/18 0320  WBC 7.2 6.3  HGB 14.2 12.8*  HCT 38.7* 35.3*  MCV 89.0 89.6  PLT 251 200     Cardiac Enzymes: No results for input(s): CKTOTAL, CKMB, CKMBINDEX, TROPONINI in the last 168 hours.  BNP: Invalid input(s): POCBNP  CBG: No results for input(s): GLUCAP in the last 168 hours.  Microbiology: Results for orders placed or performed during the hospital encounter of 10/28/18  Urine Culture     Status: Abnormal (Preliminary result)   Collection Time: 10/28/18  9:24 PM  Result Value Ref Range Status   Specimen Description   Final    URINE, RANDOM Performed at [facility], [address] [place], [place] [address]    Special Requests   Final    NONE Performed at [facility], [address], [place], [place] [address]    Culture (A)  Final    >=100,000 COLONIES/mL GRAM NEGATIVE RODS CULTURE REINCUBATED FOR BETTER GROWTH Performed at [facility], [place] [address], [place],  [address]    Report Status PENDING  Incomplete    Coagulation Studies: No results for input(s): LABPROT, INR in the last 72 hours.  Urinalysis: Recent Labs    10/28/18 2124  COLORURINE YELLOW*  LABSPEC 1.004*  PHURINE 7.0  GLUCOSEU NEGATIVE  HGBUR MODERATE*  BILIRUBINUR NEGATIVE  KETONESUR NEGATIVE  PROTEINUR 30*  NITRITE POSITIVE*  LEUKOCYTESUR LARGE*      Imaging: No results found.   Medications:   . sodium chloride Stopped (10/29/18 0513)   . acidophilus  2 capsule Oral Daily  . ALPRAZolam  1 mg Oral QHS  . clopidogrel  75 mg Oral Daily  . enoxaparin (LOVENOX) injection  40 mg Subcutaneous Q24H  . folic acid  1 mg Oral Daily  . furosemide  20 mg Oral Daily  . gabapentin  300 mg Oral TID  . hydroxychloroquine  200 mg Oral BID  . linaclotide  145 mcg Oral QAC breakfast  . methotrexate  25 mg Oral Weekly  . mirabegron ER  50 mg Oral QHS  . pantoprazole  40 mg Oral Daily  . polyethylene glycol  17 g Oral Daily  . pravastatin  20 mg Oral Daily  . sodium chloride  1 g Oral BID WC   sodium chloride, acetaminophen **OR**  acetaminophen, bisacodyl, ondansetron **OR** ondansetron (ZOFRAN) IV, senna-docusate, simethicone, traZODone  Assessment/ Plan:  84 y.o. male with COPD due to long standing asthma, multiple episodes of SIADH, HTN, prostate cancer, hyperlipidemia, inflammatory arthritis, episode of altered mental status due to acute hyponatremia  1.  Hyponatremia due to long standing SIADH. 2.  Lower extremity edema.  3.  Anemia unspecified. 4.  Chronic foley status.   Plan:  Serum sodium remains a bit lower than his baseline at 128.  Okay for discharge to home with a plan to continue sodium chloride, Lasix 20 mg daily, and fluid restriction of at least 1.3 L/day.  We plan to see the patient back in the office next week to follow-up serum sodium.  He is having his Foley catheter changed as an outpatient today as well.   LOS: 0 Zair Borawski 12/4/201910:41 AM

## 2018-10-31 NOTE — Progress Notes (Signed)
Cath Change/ Replacement  Patient is present today for a catheter change due to urinary retention.  38ml of water was removed from the balloon, a 16FR foley cath was removed with out difficulty.  Patient was cleaned and prepped in a sterile fashion with betadine and 2% lidocaine jelly was instilled into the urethra. A 16 FR foley cath was replaced into the bladder no complications were noted Urine return was noted 53ml and urine was yellow in color. The balloon was filled with 36ml of sterile water. A leg bag was attached for drainage.  A night bag was also given to the patient and patient was given instruction on how to change from one bag to another. Patient was given proper instruction on catheter care.    Preformed by: Fonnie Jarvis, CMA  Follow up: 1 month

## 2018-10-31 NOTE — Discharge Instructions (Signed)
1. Fluid restriction to <1200 cc/day

## 2018-10-31 NOTE — Care Management (Signed)
Discharge to home today per Dr. Tressia Miners. Physical therapy is recommending home health PT supervision..  [facility], [place], updated Shelbie Ammons RN MSN CCM Care Management [phone]

## 2018-11-01 LAB — URINE CULTURE: Culture: >=100000 — AB

## 2018-11-02 ENCOUNTER — Encounter: Payer: Self-pay | Admitting: Internal Medicine

## 2018-11-02 ENCOUNTER — Ambulatory Visit: Payer: Medicare PPO | Admitting: Internal Medicine

## 2018-11-02 VITALS — BP 120/64 | HR 87 | Temp 97.6°F | Ht 70.0 in | Wt 187.0 lb

## 2018-11-02 DIAGNOSIS — E222 Syndrome of inappropriate secretion of antidiuretic hormone: Secondary | ICD-10-CM | POA: Diagnosis not present

## 2018-11-02 DIAGNOSIS — K047 Periapical abscess without sinus: Secondary | ICD-10-CM | POA: Diagnosis not present

## 2018-11-02 DIAGNOSIS — F39 Unspecified mood [affective] disorder: Secondary | ICD-10-CM | POA: Diagnosis not present

## 2018-11-02 DIAGNOSIS — N390 Urinary tract infection, site not specified: Secondary | ICD-10-CM | POA: Diagnosis not present

## 2018-11-02 LAB — RENAL FUNCTION PANEL
ALBUMIN: 3.9 g/dL (ref 3.5–5.2)
BUN: 15 mg/dL (ref 6–23)
CO2: 31 mEq/L (ref 19–32)
Calcium: 9 mg/dL (ref 8.4–10.5)
Chloride: 93 mEq/L — ABNORMAL LOW (ref 96–112)
Creatinine, Ser: 0.66 mg/dL (ref 0.40–1.50)
GFR: 122.68 mL/min (ref >60.00)
Glucose, Bld: 96 mg/dL (ref 70–99)
Phosphorus: 3.7 mg/dL (ref 2.3–4.6)
Potassium: 4.7 mEq/L (ref 3.5–5.1)
Sodium: 130 mEq/L — ABNORMAL LOW (ref 135–145)

## 2018-11-02 MED ORDER — ALPRAZOLAM 1 MG PO TABS: 0.5000 mg | ORAL_TABLET | Freq: Three times a day (TID) | ORAL | 0 refills | Status: DC | PRN

## 2018-11-02 NOTE — Assessment & Plan Note (Signed)
I believe this was colonization only Did have doses of merepenem though Just had catheter changed

## 2018-11-02 NOTE — Assessment & Plan Note (Signed)
Recurrent with the vomiting, etc Back on salt and furosemide Fluid restriction Will recheck labs now

## 2018-11-02 NOTE — Progress Notes (Signed)
Subjective:    Patient ID: [name], male    DOB: [birth year], 84 y.o.   MRN: [id number]  HPI Here for hospital follow up---with daughter Had reviewed hospital course concurrently and reviewed records Had vomiting--likely related to clindamycin for dental infection No diarrhea (was constipated) Sodium lower again Some concern about UTI---was treated with meropenem (but recognized that this was likely colonization from the catheter)  Foley wasn't working right Went to [facility] to replace the catheter Was blocked with hardened sediment Told to increase water intake--but can't do that with the SIADH  Now keeping to 1300cc daily Is back on the salt and furosemide  Current Outpatient Medications on File Prior to Visit  Medication Sig Dispense Refill  . albuterol (PROVENTIL HFA;VENTOLIN HFA) 108 (90 Base) MCG/ACT inhaler Inhale 2 puffs into the lungs every 6 (six) hours as needed for wheezing or shortness of breath. 1 Inhaler 3  . ALPRAZolam (XANAX) 0.5 MG tablet TAKE 1 TABLET BY MOUTH THREE TIMES DAILY AS NEEDED FOR ANXIETY OR SLEEP (Patient taking differently: Take 0.5 mg by mouth 3 (three) times daily as needed for anxiety or sleep. ) 90 tablet 0  . Cholecalciferol (VITAMIN D3) 5000 UNITS CAPS Take 1 capsule by mouth daily.    . clopidogrel (PLAVIX) 75 MG tablet Take 75 mg by mouth daily.   4  . Coenzyme Q10 (CO Q 10) 100 MG CAPS Take 1 capsule by mouth daily.    . Cranberry 425 MG CAPS Take 2 capsules by mouth daily.    . fluticasone (FLONASE) 50 MCG/ACT nasal spray Place 2 sprays into both nostrils 2 (two) times daily. 48 g 3  . folic acid (FOLVITE) 1 MG tablet Take 1 tablet (1 mg total) by mouth daily. 90 tablet 3  . furosemide (LASIX) 20 MG tablet Take 1 tablet (20 mg total) by mouth daily. 30 tablet 1  . gabapentin (NEURONTIN) 300 MG capsule TAKE 1 CAPSULE BY MOUTH THREE TIMES DAILY (Patient taking differently: Take 300 mg by mouth 3 (three) times daily. ) 90 capsule 11    . hydroxychloroquine (PLAQUENIL) 200 MG tablet Take 1 tablet by mouth 2 (two) times daily.    Marland Kitchen ibuprofen (ADVIL,MOTRIN) 400 MG tablet Take 400 mg by mouth every 6 (six) hours as needed for mild pain.     . methotrexate (RHEUMATREX) 2.5 MG tablet Take 10 tablets by mouth once a week.    . Milk Thistle 250 MG CAPS Take 1 capsule by mouth daily.    . Multiple Vitamin (MULTIVITAMIN) tablet Take 1 tablet by mouth daily.      Marland Kitchen MYRBETRIQ 50 MG TB24 tablet Take 50 mg by mouth at bedtime.   11  . nystatin (MYCOSTATIN/NYSTOP) powder Apply topically 4 (four) times daily. (Patient taking differently: Apply topically 4 (four) times daily as needed (rash). ) 15 g 2  . omeprazole (PRILOSEC) 20 MG capsule TAKE 1 CAPSULE(20 MG) BY MOUTH DAILY AS NEEDED (Patient taking differently: Take 20 mg by mouth daily as needed (reflux). ) 90 capsule 3  . ondansetron (ZOFRAN) 4 MG tablet Take 1 tablet (4 mg total) by mouth every 8 (eight) hours as needed for nausea or vomiting. 30 tablet 1  . polyethylene glycol (GLYCOLAX) packet Take 17 g by mouth at bedtime.     . pravastatin (PRAVACHOL) 20 MG tablet TAKE 1 TABLET BY MOUTH EVERY DAY (Patient taking differently: Take 20 mg by mouth at bedtime. ) 90 tablet 3  . Probiotic Product (ALIGN)  4 MG CAPS Take 1 capsule (4 mg total) by mouth daily. 15 capsule 0  . Selenium 200 MCG CAPS Take 1 capsule by mouth daily.    . sodium chloride 1 g tablet Take 1 tablet (1 g total) by mouth 2 (two) times daily with a meal. 60 tablet 1  . Turmeric 500 MG CAPS Take 500 mg by mouth daily.    . vitamin B-12 (CYANOCOBALAMIN) 500 MCG tablet Take 500 mcg by mouth daily.     Current Facility-Administered Medications on File Prior to Visit  Medication Dose Route Frequency Provider Last Rate Last Dose  . omalizumab Arvid Right) injection 375 mg  375 mg Subcutaneous Q14 Days Chesley Mires, MD   375 mg at 10/05/18 1555    Allergies  Allergen Reactions  . Ciprofloxacin Nausea And Vomiting    Headache   . Citalopram Hydrobromide Other (See Comments)    "unknown"  . Lorazepam     Adverse reaction  . Paroxetine Nausea Only  . Ramipril Other (See Comments)    "unknown"  . Simvastatin   . Sulfa Antibiotics Other (See Comments)    Past Medical History:  Diagnosis Date  . Allergy   . Anemia   . Anxiety   . Arthritis    rheumatoid  . Asthma   . Basal cell carcinoma    back  . CAD (coronary artery disease)   . Cancer [facility])    Prostate  . Chronic sinusitis   . Depression   . Diverticulitis   . ED (erectile dysfunction)   . GERD (gastroesophageal reflux disease)   . History of SIADH   . Hx of adenomatous colonic polyps   . Hypertension   . Hyponatremia   . IBS (irritable bowel syndrome)   . Kidney lesion   . Neurodermatitis   . Neurogenic bladder   . OSA (obstructive sleep apnea)     Past Surgical History:  Procedure Laterality Date  . KNEE ARTHROPLASTY Right 09/02/2015   Procedure: COMPUTER ASSISTED TOTAL KNEE ARTHROPLASTY;  Surgeon: [name], MD;  Location: [facility] ORS;  Service: Orthopedics;  Laterality: Right;  . NASAL SINUS SURGERY  2009   DEVIATED SEPTUM AND POLYPS  . permanent indwelling catheter    . PROSTATE SURGERY     PROSTATECTOMY  . TOTAL KNEE ARTHROPLASTY Left 9/15   Dr [name]  . URETHRAL STRICTURE DILATATION  02-2010   Dr.Cope    Family History  Problem Relation Age of Onset  . Heart disease Mother 15       heart failure  . Pneumonia Father 1       pnemonia  . Skin cancer Father   . Skin cancer Sister   . Skin cancer Son   . Colon cancer Neg Hx   . Esophageal cancer Neg Hx   . Stomach cancer Neg Hx   . Pancreatic cancer Neg Hx   . Liver disease Neg Hx     Social History   Socioeconomic History  . Marital status: Married    Spouse name: Not on file  . Number of children: 2  . Years of education: Not on file  . Highest education level: Not on file  Occupational History  . Occupation: Radio Licensed conveyancer    Comment: retired   Scientific laboratory technician  . Financial resource strain: Not on file  . Food insecurity:    Worry: Not on file    Inability: Not on file  . Transportation needs:    Medical: Not  on file    Non-medical: Not on file  Tobacco Use  . Smoking status: Never Smoker  . Smokeless tobacco: Never Used  Substance and Sexual Activity  . Alcohol use: Yes    Comment: 5 days out of 7 drinks gin   . Drug use: No  . Sexual activity: Not Currently  Lifestyle  . Physical activity:    Days per week: Not on file    Minutes per session: Not on file  . Stress: Not on file  Relationships  . Social connections:    Talks on phone: Not on file    Gets together: Not on file    Attends religious service: Not on file    Active member of club or organization: Not on file    Attends meetings of clubs or organizations: Not on file    Relationship status: Not on file  . Intimate partner violence:    Fear of current or ex partner: Not on file    Emotionally abused: Not on file    Physically abused: Not on file    Forced sexual activity: Not on file  Other Topics Concern  . Not on file  Social History Narrative   Not sure about a living will or health care POA   Wife should make health care decisions for him--then daughter, then son   Would accept resuscitation attempts   Not sure about tube feeds   Review of Systems  Weight down to 185#--lowest in years Appetite is really good---had gained back 2# No cough or breathing problems Sleeping better now Is getting the problem teeth removed 12/10     Objective:   Physical Exam  Constitutional: He appears well-developed. No distress.  HENT:  No gingival inflammation  Neck: No thyromegaly present.  Cardiovascular: Normal rate, regular rhythm and normal heart sounds. Exam reveals no gallop.  No murmur heard. Respiratory: Effort normal and breath sounds normal. No respiratory distress. He has no wheezes. He has no rales.  GI: Soft. There is no tenderness.   Musculoskeletal: He exhibits no edema.  Lymphadenopathy:    He has no cervical adenopathy.  Psychiatric: He has a normal mood and affect. His behavior is normal.           Assessment & Plan:

## 2018-11-02 NOTE — Assessment & Plan Note (Signed)
Ongoing anxiety Has the alprazolam for prn

## 2018-11-02 NOTE — Assessment & Plan Note (Signed)
Better Will be having the extractions next week

## 2018-11-05 ENCOUNTER — Ambulatory Visit: Payer: Medicare PPO

## 2018-11-08 ENCOUNTER — Telehealth: Payer: Self-pay | Admitting: Internal Medicine

## 2018-11-08 DIAGNOSIS — G8929 Other chronic pain: Secondary | ICD-10-CM | POA: Diagnosis not present

## 2018-11-08 DIAGNOSIS — F411 Generalized anxiety disorder: Secondary | ICD-10-CM | POA: Diagnosis not present

## 2018-11-08 DIAGNOSIS — F329 Major depressive disorder, single episode, unspecified: Secondary | ICD-10-CM | POA: Diagnosis not present

## 2018-11-08 DIAGNOSIS — I10 Essential (primary) hypertension: Secondary | ICD-10-CM | POA: Diagnosis not present

## 2018-11-08 DIAGNOSIS — J4540 Moderate persistent asthma, uncomplicated: Secondary | ICD-10-CM | POA: Diagnosis not present

## 2018-11-08 DIAGNOSIS — F39 Unspecified mood [affective] disorder: Secondary | ICD-10-CM | POA: Diagnosis not present

## 2018-11-08 DIAGNOSIS — N319 Neuromuscular dysfunction of bladder, unspecified: Secondary | ICD-10-CM | POA: Diagnosis not present

## 2018-11-08 DIAGNOSIS — E871 Hypo-osmolality and hyponatremia: Secondary | ICD-10-CM | POA: Diagnosis not present

## 2018-11-08 DIAGNOSIS — I2510 Atherosclerotic heart disease of native coronary artery without angina pectoris: Secondary | ICD-10-CM | POA: Diagnosis not present

## 2018-11-08 NOTE — Telephone Encounter (Signed)
Spoke to pt. He will stay off of it for 2 days. He said he has always taken ibuprofen for pain, at least 1 at bedtime every night,  but now that he is on Plavix, he has been told he should not be taking ibuprofen. Pt needs some clarity.

## 2018-11-08 NOTE — Telephone Encounter (Signed)
Pt got 2 teeth pulled by the Orthodontist. They told him to take advil and tylenol. Pt stated he was told by the orthodontist to stop taking the Plavix until finishing the advil and tylenol. Pt feels he still needs to take the Plavix. Pt is requesting a call.

## 2018-11-08 NOTE — Telephone Encounter (Signed)
Spoke to pt

## 2018-11-08 NOTE — Telephone Encounter (Signed)
Have him hold the plavix for 2 days---and then restart it

## 2018-11-08 NOTE — Telephone Encounter (Signed)
He can take ibuprofen briefly for his tooth pain and that should be fine

## 2018-11-09 ENCOUNTER — Ambulatory Visit (INDEPENDENT_AMBULATORY_CARE_PROVIDER_SITE_OTHER): Payer: Medicare PPO

## 2018-11-09 DIAGNOSIS — J4540 Moderate persistent asthma, uncomplicated: Secondary | ICD-10-CM

## 2018-11-09 MED ORDER — OMALIZUMAB 150 MG ~~LOC~~ SOLR
375.0000 mg | SUBCUTANEOUS | Status: DC
  Administered 2018-11-09 – 2018-12-14 (×3): 375 mg via SUBCUTANEOUS

## 2018-11-09 NOTE — Progress Notes (Signed)
Documentation of medication administration and charges of Xolair have been completed by Zuria Fosdick, CMA based on the hand written Xolair documentation sheet completed by Tammy Scott, who administered the medication.  

## 2018-11-12 DIAGNOSIS — E871 Hypo-osmolality and hyponatremia: Secondary | ICD-10-CM | POA: Diagnosis not present

## 2018-11-13 ENCOUNTER — Telehealth: Payer: Self-pay

## 2018-11-13 DIAGNOSIS — F329 Major depressive disorder, single episode, unspecified: Secondary | ICD-10-CM | POA: Diagnosis not present

## 2018-11-13 DIAGNOSIS — I2510 Atherosclerotic heart disease of native coronary artery without angina pectoris: Secondary | ICD-10-CM | POA: Diagnosis not present

## 2018-11-13 DIAGNOSIS — I10 Essential (primary) hypertension: Secondary | ICD-10-CM | POA: Diagnosis not present

## 2018-11-13 DIAGNOSIS — N319 Neuromuscular dysfunction of bladder, unspecified: Secondary | ICD-10-CM | POA: Diagnosis not present

## 2018-11-13 DIAGNOSIS — G8929 Other chronic pain: Secondary | ICD-10-CM | POA: Diagnosis not present

## 2018-11-13 DIAGNOSIS — E871 Hypo-osmolality and hyponatremia: Secondary | ICD-10-CM | POA: Diagnosis not present

## 2018-11-13 DIAGNOSIS — F39 Unspecified mood [affective] disorder: Secondary | ICD-10-CM | POA: Diagnosis not present

## 2018-11-13 DIAGNOSIS — J4540 Moderate persistent asthma, uncomplicated: Secondary | ICD-10-CM | POA: Diagnosis not present

## 2018-11-13 DIAGNOSIS — F411 Generalized anxiety disorder: Secondary | ICD-10-CM | POA: Diagnosis not present

## 2018-11-13 NOTE — Telephone Encounter (Signed)
[facility] with [facility] request verbal orders for [facility] PT 2 x a week for 5 weeks. Dr Silvio Pate out of office with no computer access. Request sent to Dr Einar Pheasant.

## 2018-11-13 NOTE — Telephone Encounter (Signed)
[facility] for home health PT 2x per week x 5 weeks.

## 2018-11-14 ENCOUNTER — Telehealth: Payer: Self-pay | Admitting: Internal Medicine

## 2018-11-14 NOTE — Telephone Encounter (Signed)
Orders left on VM for [name]

## 2018-11-14 NOTE — Telephone Encounter (Signed)
Pt stated he is taking Plavix and he has a drug reaction when taking omeprazole. Pt need a replacement for the omeprazole. Please advise.

## 2018-11-14 NOTE — Telephone Encounter (Signed)
What was the drug reaction. Rash? Trouble breathing? Has he taken any other medications for reflux in the past?

## 2018-11-15 DIAGNOSIS — D649 Anemia, unspecified: Secondary | ICD-10-CM | POA: Diagnosis not present

## 2018-11-15 DIAGNOSIS — N2889 Other specified disorders of kidney and ureter: Secondary | ICD-10-CM | POA: Diagnosis not present

## 2018-11-15 DIAGNOSIS — E871 Hypo-osmolality and hyponatremia: Secondary | ICD-10-CM | POA: Diagnosis not present

## 2018-11-15 DIAGNOSIS — R600 Localized edema: Secondary | ICD-10-CM | POA: Diagnosis not present

## 2018-11-15 MED ORDER — FAMOTIDINE 20 MG PO TABS: 20.0000 mg | ORAL_TABLET | Freq: Every day | ORAL | 2 refills | Status: DC

## 2018-11-15 NOTE — Telephone Encounter (Signed)
Pt is aware as instructed, pt requested Rx be sent to pharmacy... Rx sent through e-scribe

## 2018-11-15 NOTE — Telephone Encounter (Signed)
Pt did not have a reaction to a medication.... pt states the pharmacist told him that taking Omeprazole decreases the effectiveness of the Plavix and medication should be changed...Marland Kitchen please advise

## 2018-11-15 NOTE — Addendum Note (Signed)
Addended by: Lurlean Nanny on: 11/15/2018 05:06 PM   Modules accepted: Orders

## 2018-11-15 NOTE — Telephone Encounter (Signed)
Then I would advise him to try Famotadine 20 mg daily OTC

## 2018-11-16 DIAGNOSIS — E871 Hypo-osmolality and hyponatremia: Secondary | ICD-10-CM | POA: Diagnosis not present

## 2018-11-16 DIAGNOSIS — F39 Unspecified mood [affective] disorder: Secondary | ICD-10-CM | POA: Diagnosis not present

## 2018-11-16 DIAGNOSIS — N319 Neuromuscular dysfunction of bladder, unspecified: Secondary | ICD-10-CM | POA: Diagnosis not present

## 2018-11-16 DIAGNOSIS — J4540 Moderate persistent asthma, uncomplicated: Secondary | ICD-10-CM | POA: Diagnosis not present

## 2018-11-16 DIAGNOSIS — G8929 Other chronic pain: Secondary | ICD-10-CM | POA: Diagnosis not present

## 2018-11-16 DIAGNOSIS — F329 Major depressive disorder, single episode, unspecified: Secondary | ICD-10-CM | POA: Diagnosis not present

## 2018-11-16 DIAGNOSIS — I10 Essential (primary) hypertension: Secondary | ICD-10-CM | POA: Diagnosis not present

## 2018-11-16 DIAGNOSIS — I2510 Atherosclerotic heart disease of native coronary artery without angina pectoris: Secondary | ICD-10-CM | POA: Diagnosis not present

## 2018-11-16 DIAGNOSIS — F411 Generalized anxiety disorder: Secondary | ICD-10-CM | POA: Diagnosis not present

## 2018-11-26 ENCOUNTER — Ambulatory Visit
Admission: RE | Admit: 2018-11-26 | Discharge: 2018-11-26 | Disposition: A | Discharge status: Home / Self Care | Payer: Medicare PPO | Source: Ambulatory Visit | Attending: Urology | Admitting: Urology

## 2018-11-26 ENCOUNTER — Telehealth: Payer: Self-pay | Admitting: Pulmonary Disease

## 2018-11-26 DIAGNOSIS — J4540 Moderate persistent asthma, uncomplicated: Secondary | ICD-10-CM | POA: Diagnosis not present

## 2018-11-26 DIAGNOSIS — I10 Essential (primary) hypertension: Secondary | ICD-10-CM | POA: Diagnosis not present

## 2018-11-26 DIAGNOSIS — G8929 Other chronic pain: Secondary | ICD-10-CM | POA: Diagnosis not present

## 2018-11-26 DIAGNOSIS — N2889 Other specified disorders of kidney and ureter: Secondary | ICD-10-CM | POA: Diagnosis not present

## 2018-11-26 DIAGNOSIS — N319 Neuromuscular dysfunction of bladder, unspecified: Secondary | ICD-10-CM | POA: Diagnosis not present

## 2018-11-26 DIAGNOSIS — F329 Major depressive disorder, single episode, unspecified: Secondary | ICD-10-CM | POA: Diagnosis not present

## 2018-11-26 DIAGNOSIS — F39 Unspecified mood [affective] disorder: Secondary | ICD-10-CM | POA: Diagnosis not present

## 2018-11-26 DIAGNOSIS — F411 Generalized anxiety disorder: Secondary | ICD-10-CM | POA: Diagnosis not present

## 2018-11-26 DIAGNOSIS — I2510 Atherosclerotic heart disease of native coronary artery without angina pectoris: Secondary | ICD-10-CM | POA: Diagnosis not present

## 2018-11-26 DIAGNOSIS — E871 Hypo-osmolality and hyponatremia: Secondary | ICD-10-CM | POA: Diagnosis not present

## 2018-11-26 MED ORDER — IOPAMIDOL (ISOVUE-300) INJECTION 61%
100.0000 mL | Freq: Once | INTRAVENOUS | Status: AC | PRN
  Administered 2018-11-26: 100 mL via INTRAVENOUS

## 2018-11-26 NOTE — Telephone Encounter (Signed)
Prefilled Syringe: #150mg  4  #75mg  2 Ordered Date: 11/26/18 Shipping Date: 11/29/18

## 2018-11-29 ENCOUNTER — Ambulatory Visit (INDEPENDENT_AMBULATORY_CARE_PROVIDER_SITE_OTHER): Payer: Medicare PPO

## 2018-11-29 DIAGNOSIS — J4540 Moderate persistent asthma, uncomplicated: Secondary | ICD-10-CM

## 2018-11-29 NOTE — Telephone Encounter (Signed)
Prefilled Syringes: # 150mg  4  #75mg  2 Arrival Date: 11/29/18 Lot #: 150mg  [id number]      75mg  [id number] Exp Date: 150mg  04/2019   75mg  05/2019

## 2018-11-30 DIAGNOSIS — J4540 Moderate persistent asthma, uncomplicated: Secondary | ICD-10-CM | POA: Diagnosis not present

## 2018-11-30 DIAGNOSIS — F411 Generalized anxiety disorder: Secondary | ICD-10-CM | POA: Diagnosis not present

## 2018-11-30 DIAGNOSIS — F329 Major depressive disorder, single episode, unspecified: Secondary | ICD-10-CM | POA: Diagnosis not present

## 2018-11-30 DIAGNOSIS — I2510 Atherosclerotic heart disease of native coronary artery without angina pectoris: Secondary | ICD-10-CM | POA: Diagnosis not present

## 2018-11-30 DIAGNOSIS — F39 Unspecified mood [affective] disorder: Secondary | ICD-10-CM | POA: Diagnosis not present

## 2018-11-30 DIAGNOSIS — E871 Hypo-osmolality and hyponatremia: Secondary | ICD-10-CM | POA: Diagnosis not present

## 2018-11-30 DIAGNOSIS — G8929 Other chronic pain: Secondary | ICD-10-CM | POA: Diagnosis not present

## 2018-11-30 DIAGNOSIS — N319 Neuromuscular dysfunction of bladder, unspecified: Secondary | ICD-10-CM | POA: Diagnosis not present

## 2018-11-30 DIAGNOSIS — I10 Essential (primary) hypertension: Secondary | ICD-10-CM | POA: Diagnosis not present

## 2018-12-03 DIAGNOSIS — H539 Unspecified visual disturbance: Secondary | ICD-10-CM | POA: Diagnosis not present

## 2018-12-03 DIAGNOSIS — Z8673 Personal history of transient ischemic attack (TIA), and cerebral infarction without residual deficits: Secondary | ICD-10-CM | POA: Diagnosis not present

## 2018-12-03 NOTE — Progress Notes (Signed)
Documented by Zahki Hoogendoorn CMA based on hand-written Xolair documentation sheet completed by Tammy Scott CMA, who administered the medication.  

## 2018-12-04 ENCOUNTER — Ambulatory Visit: Payer: Medicare PPO | Admitting: Urology

## 2018-12-05 DIAGNOSIS — N319 Neuromuscular dysfunction of bladder, unspecified: Secondary | ICD-10-CM | POA: Diagnosis not present

## 2018-12-05 DIAGNOSIS — I10 Essential (primary) hypertension: Secondary | ICD-10-CM | POA: Diagnosis not present

## 2018-12-05 DIAGNOSIS — E871 Hypo-osmolality and hyponatremia: Secondary | ICD-10-CM | POA: Diagnosis not present

## 2018-12-05 DIAGNOSIS — G8929 Other chronic pain: Secondary | ICD-10-CM | POA: Diagnosis not present

## 2018-12-05 DIAGNOSIS — F329 Major depressive disorder, single episode, unspecified: Secondary | ICD-10-CM | POA: Diagnosis not present

## 2018-12-05 DIAGNOSIS — I2510 Atherosclerotic heart disease of native coronary artery without angina pectoris: Secondary | ICD-10-CM | POA: Diagnosis not present

## 2018-12-05 DIAGNOSIS — F411 Generalized anxiety disorder: Secondary | ICD-10-CM | POA: Diagnosis not present

## 2018-12-05 DIAGNOSIS — F39 Unspecified mood [affective] disorder: Secondary | ICD-10-CM | POA: Diagnosis not present

## 2018-12-05 DIAGNOSIS — J4540 Moderate persistent asthma, uncomplicated: Secondary | ICD-10-CM | POA: Diagnosis not present

## 2018-12-06 DIAGNOSIS — I10 Essential (primary) hypertension: Secondary | ICD-10-CM | POA: Diagnosis not present

## 2018-12-06 DIAGNOSIS — F39 Unspecified mood [affective] disorder: Secondary | ICD-10-CM | POA: Diagnosis not present

## 2018-12-06 DIAGNOSIS — F329 Major depressive disorder, single episode, unspecified: Secondary | ICD-10-CM | POA: Diagnosis not present

## 2018-12-06 DIAGNOSIS — Z79899 Other long term (current) drug therapy: Secondary | ICD-10-CM | POA: Diagnosis not present

## 2018-12-06 DIAGNOSIS — J4540 Moderate persistent asthma, uncomplicated: Secondary | ICD-10-CM | POA: Diagnosis not present

## 2018-12-06 DIAGNOSIS — N319 Neuromuscular dysfunction of bladder, unspecified: Secondary | ICD-10-CM | POA: Diagnosis not present

## 2018-12-06 DIAGNOSIS — E871 Hypo-osmolality and hyponatremia: Secondary | ICD-10-CM | POA: Diagnosis not present

## 2018-12-06 DIAGNOSIS — Z8673 Personal history of transient ischemic attack (TIA), and cerebral infarction without residual deficits: Secondary | ICD-10-CM | POA: Insufficient documentation

## 2018-12-06 DIAGNOSIS — H539 Unspecified visual disturbance: Secondary | ICD-10-CM | POA: Insufficient documentation

## 2018-12-06 DIAGNOSIS — G8929 Other chronic pain: Secondary | ICD-10-CM | POA: Diagnosis not present

## 2018-12-06 DIAGNOSIS — I2510 Atherosclerotic heart disease of native coronary artery without angina pectoris: Secondary | ICD-10-CM | POA: Diagnosis not present

## 2018-12-06 DIAGNOSIS — M0579 Rheumatoid arthritis with rheumatoid factor of multiple sites without organ or systems involvement: Secondary | ICD-10-CM | POA: Diagnosis not present

## 2018-12-06 DIAGNOSIS — F411 Generalized anxiety disorder: Secondary | ICD-10-CM | POA: Diagnosis not present

## 2018-12-07 ENCOUNTER — Ambulatory Visit: Payer: Medicare PPO | Admitting: Urology

## 2018-12-07 ENCOUNTER — Encounter: Payer: Self-pay | Admitting: Urology

## 2018-12-07 VITALS — BP 136/68 | HR 78 | Ht 70.0 in

## 2018-12-07 DIAGNOSIS — N319 Neuromuscular dysfunction of bladder, unspecified: Secondary | ICD-10-CM | POA: Diagnosis not present

## 2018-12-07 DIAGNOSIS — N2889 Other specified disorders of kidney and ureter: Secondary | ICD-10-CM

## 2018-12-07 NOTE — Progress Notes (Signed)
12/07/2018 3:41 PM   [name] 05-18-1934 [id number]  Referring provider: [name], MD [address] [place], [place] [address]  Chief Complaint  Patient presents with  . Follow-up    renal mass    HPI: 85 year old male who returns today for follow-up of right lower pole renal mass.  Since last visit, he was admitted to the hospitalist service for hyponatremia and confusion.  He was initially found to have an incidental 3.1 x 4 cm right lower pole renal mass on 06/2018 for further evaluation of constipation in the emergency room.  He returns today with repeat interval imaging.  He does have mild interval increase in the size of the lesion, now measuring 4.2 x 3.3 cm with extension into the renal sinus.  This is in comparison to his previous at which time it measured 4.0 x 3.0 on 07/17/2018.  There is also a posterior interpolar right renal cyst.  No lymphadenopathy.  He does have a personal history prostate cancer status post radical prostatectomy 1993 by Dr. [name] and salvage radiation in 1998.  He developed a small capacity bladder and is been managed with indwelling Foley catheter.  He continues to have chronic issues with his Foley catheter now on Myrbetriq for bladder spasms.  He is very particular about who changed his catheter and when.  He underwent cystoscopy in 08/2018 with Dr. [name] at which time no obvious bladder pathology was identified.  Previous abdominal surgeries include open prostatectomy.  He is on Plavix for history of TIA.  PMH: Past Medical History:  Diagnosis Date  . Allergy   . Anemia   . Anxiety   . Arthritis    rheumatoid  . Asthma   . Basal cell carcinoma    back  . CAD (coronary artery disease)   . Cancer [facility])    Prostate  . Chronic sinusitis   . Depression   . Diverticulitis   . ED (erectile dysfunction)   . GERD (gastroesophageal reflux disease)   . History of SIADH   . Hx of adenomatous colonic polyps   .  Hypertension   . Hyponatremia   . IBS (irritable bowel syndrome)   . Kidney lesion   . Neurodermatitis   . Neurogenic bladder   . OSA (obstructive sleep apnea)     Surgical History: Past Surgical History:  Procedure Laterality Date  . KNEE ARTHROPLASTY Right 09/02/2015   Procedure: COMPUTER ASSISTED TOTAL KNEE ARTHROPLASTY;  Surgeon: [name], MD;  Location: [facility] ORS;  Service: Orthopedics;  Laterality: Right;  . NASAL SINUS SURGERY  2009   DEVIATED SEPTUM AND POLYPS  . permanent indwelling catheter    . PROSTATE SURGERY     PROSTATECTOMY  . TOTAL KNEE ARTHROPLASTY Left 9/15   Dr [name]  . URETHRAL STRICTURE DILATATION  02-2010   Dr.Cope    Home Medications:  Allergies as of 12/07/2018      Reactions   Ciprofloxacin Nausea And Vomiting   Headache   Citalopram Hydrobromide Other (See Comments)   "unknown"   Clindamycin Nausea And Vomiting   Lorazepam    Adverse reaction   Paroxetine Nausea Only   Ramipril Other (See Comments)   "unknown"   Simvastatin    Sulfa Antibiotics Other (See Comments)      Medication List       Accurate as of December 07, 2018  3:41 PM. Always use your most recent med list.  albuterol 108 (90 Base) MCG/ACT inhaler Commonly known as:  PROVENTIL HFA;VENTOLIN HFA Inhale 2 puffs into the lungs every 6 (six) hours as needed for wheezing or shortness of breath.   ALIGN 4 MG Caps Take 1 capsule (4 mg total) by mouth daily.   ALPRAZolam 1 MG tablet Commonly known as:  XANAX Take 0.5 tablets (0.5 mg total) by mouth 3 (three) times daily as needed for anxiety.   clopidogrel 75 MG tablet Commonly known as:  PLAVIX Take 75 mg by mouth daily.   Co Q 10 100 MG Caps Take 1 capsule by mouth daily.   Cranberry 425 MG Caps Take 2 capsules by mouth daily.   famotidine 20 MG tablet Commonly known as:  PEPCID Take 1 tablet (20 mg total) by mouth daily.   fluticasone 50 MCG/ACT nasal spray Commonly known as:  FLONASE Place 2 sprays  into both nostrils 2 (two) times daily.   folic acid 1 MG tablet Commonly known as:  FOLVITE Take 1 tablet (1 mg total) by mouth daily.   furosemide 20 MG tablet Commonly known as:  LASIX Take 1 tablet (20 mg total) by mouth daily.   gabapentin 300 MG capsule Commonly known as:  NEURONTIN TAKE 1 CAPSULE BY MOUTH THREE TIMES DAILY   hydroxychloroquine 200 MG tablet Commonly known as:  PLAQUENIL Take 1 tablet by mouth 2 (two) times daily.   ibuprofen 400 MG tablet Commonly known as:  ADVIL,MOTRIN Take 400 mg by mouth every 6 (six) hours as needed for mild pain.   methotrexate 2.5 MG tablet Commonly known as:  RHEUMATREX Take 10 tablets by mouth once a week.   Milk Thistle 250 MG Caps Take 1 capsule by mouth daily.   multivitamin tablet Take 1 tablet by mouth daily.   MYRBETRIQ 50 MG Tb24 tablet Generic drug:  mirabegron ER Take 50 mg by mouth at bedtime.   nystatin powder Commonly known as:  MYCOSTATIN/NYSTOP Apply topically 4 (four) times daily.   omeprazole 20 MG capsule Commonly known as:  PRILOSEC TAKE 1 CAPSULE(20 MG) BY MOUTH DAILY AS NEEDED   ondansetron 4 MG tablet Commonly known as:  ZOFRAN Take 1 tablet (4 mg total) by mouth every 8 (eight) hours as needed for nausea or vomiting.   polyethylene glycol packet Commonly known as:  MIRALAX / GLYCOLAX Take 17 g by mouth at bedtime.   pravastatin 20 MG tablet Commonly known as:  PRAVACHOL TAKE 1 TABLET BY MOUTH EVERY DAY   Selenium 200 MCG Caps Take 1 capsule by mouth daily.   sodium chloride 1 g tablet Take 1 tablet (1 g total) by mouth 2 (two) times daily with a meal.   Turmeric 500 MG Caps Take 500 mg by mouth daily.   vitamin B-12 500 MCG tablet Commonly known as:  CYANOCOBALAMIN Take 500 mcg by mouth daily.   Vitamin D3 125 MCG (5000 UT) Caps Take 1 capsule by mouth daily.       Allergies:  Allergies  Allergen Reactions  . Ciprofloxacin Nausea And Vomiting    Headache  .  Citalopram Hydrobromide Other (See Comments)    "unknown"  . Clindamycin Nausea And Vomiting  . Lorazepam     Adverse reaction  . Paroxetine Nausea Only  . Ramipril Other (See Comments)    "unknown"  . Simvastatin   . Sulfa Antibiotics Other (See Comments)    Family History: Family History  Problem Relation Age of Onset  . Heart disease Mother 63  heart failure  . Pneumonia Father 61       pnemonia  . Skin cancer Father   . Skin cancer Sister   . Skin cancer Son   . Colon cancer Neg Hx   . Esophageal cancer Neg Hx   . Stomach cancer Neg Hx   . Pancreatic cancer Neg Hx   . Liver disease Neg Hx     Social History:  reports that he has never smoked. He has never used smokeless tobacco. He reports current alcohol use. He reports that he does not use drugs.  ROS: UROLOGY Frequent Urination?: No Hard to postpone urination?: No Burning/pain with urination?: No Get up at night to urinate?: No Leakage of urine?: No Urine stream starts and stops?: No Trouble starting stream?: No Do you have to strain to urinate?: No Blood in urine?: No Urinary tract infection?: No Sexually transmitted disease?: No Injury to kidneys or bladder?: No Painful intercourse?: No Weak stream?: No Erection problems?: No Penile pain?: No  Gastrointestinal Nausea?: No Vomiting?: No Indigestion/heartburn?: No Diarrhea?: No Constipation?: No  Constitutional Fever: No Night sweats?: No Weight loss?: No Fatigue?: No  Skin Skin rash/lesions?: Yes Itching?: Yes  Eyes Blurred vision?: No Double vision?: No  Ears/Nose/Throat Sore throat?: No Sinus problems?: No  Hematologic/Lymphatic Swollen glands?: No Easy bruising?: No  Cardiovascular Leg swelling?: No Chest pain?: No  Respiratory Cough?: No Shortness of breath?: No  Endocrine Excessive thirst?: No  Musculoskeletal Back pain?: No Joint pain?: Yes  Neurological Headaches?: No Dizziness?:  No  Psychologic Depression?: No Anxiety?: Yes  Physical Exam: BP 136/68 (BP Location: Left Arm, Patient Position: Sitting, Cuff Size: Normal)   Pulse 78   Ht 5\' 10"  (1.778 m)   BMI 26.83 kg/m   Constitutional:  Alert and oriented, No acute distress.  Accompanied by wife today.  Ambulating with walker. HEENT: [place] AT, moist mucus membranes.  Trachea midline, no masses. Cardiovascular: No clubbing, cyanosis, or edema. Respiratory: Normal respiratory effort, no increased work of breathing. GI: Abdomen is soft, nontender, nondistended, no abdominal masses GU: No CVA tenderness Skin: No rashes, bruises or suspicious lesions. Neurologic: Grossly intact, no focal deficits, moving all 4 extremities. Psychiatric: Normal mood and affect.  Laboratory Data: Lab Results  Component Value Date   WBC 6.3 10/29/2018   HGB 12.8 (L) 10/29/2018   HCT 35.3 (L) 10/29/2018   MCV 89.6 10/29/2018   PLT 200 10/29/2018    Lab Results  Component Value Date   CREATININE 0.66 11/02/2018    Lab Results  Component Value Date   PSA 0.00 (L) 04/27/2017   PSA 0.01 (L) 05/18/2016   PSA 0.01 (L) 01/26/2015    Lab Results  Component Value Date   HGBA1C 5.1 05/26/2018     Pertinent Imaging: CLINICAL DATA:  Follow-up suspicious right renal mass.  EXAM: CT ABDOMEN WITHOUT AND WITH CONTRAST  TECHNIQUE: Multidetector CT imaging of the abdomen was performed following the standard protocol before and following the bolus administration of intravenous contrast.  CONTRAST:  154mL ISOVUE-300 IOPAMIDOL (ISOVUE-300) INJECTION 61%  COMPARISON:  07/19/2018 CT abdomen/pelvis.  FINDINGS: Lower chest: No significant pulmonary nodules or acute consolidative airspace disease. Stable parenchymal band in the dependent left lung base compatible with postinfectious/postinflammatory scarring. Coronary atherosclerosis.  Hepatobiliary: Normal liver size. Subcentimeter hypodense lateral segment left liver  lobe lesion is too small to characterize and is stable since 2012, considered benign. No new liver lesions. Normal gallbladder with no radiopaque cholelithiasis. No biliary ductal dilatation.  Pancreas: Normal, with no mass or duct dilation.  Spleen: Normal size spleen. Stable punctate calcified granulomas scattered in the spleen. No splenic masses.  Adrenals/Urinary Tract: Normal adrenals. No renal stones. No hydronephrosis. There is an avidly enhancing 4.2 x 3.3 cm renal cortical mass centered in the posterior lower right kidney (series 8/image 75) with extension into the lower right renal sinus, previously 4.0 x 3.0 cm on 07/19/2018 using similar measurement technique, mildly increased. Exophytic simple 1.8 cm posterior interpolar right renal cyst. Several subcentimeter hypodense renal cortical lesions in both kidneys, too small to characterize, appreciably changed. No new renal lesions.  Stomach/Bowel: Small hiatal hernia. Otherwise normal nondistended stomach. Visualized small and large bowel is normal caliber, with no bowel wall thickening. Mild sigmoid diverticulosis.  Vascular/Lymphatic: Atherosclerotic nonaneurysmal abdominal aorta. Patent portal, splenic, hepatic and renal veins. No pathologically enlarged lymph nodes in the abdomen.  Other: No pneumoperitoneum, ascites or focal fluid collection.  Musculoskeletal: No aggressive appearing focal osseous lesions. Marked thoracolumbar spondylosis.  IMPRESSION: 1. Mild interval growth of avidly enhancing 4.2 cm renal cortical mass centered in the posterior lower right kidney with extension into the lower right renal sinus, most compatible with clear cell renal cell carcinoma. 2. No evidence of metastatic disease in the abdomen. Patent renal veins without tumor thrombus. 3. Aortic Atherosclerosis (ICD10-I70.0).   Electronically Signed   By: Ilona Sorrel M.D.   On: 11/27/2018 02:15  CT scan personally  reviewed, agree with radiologic interpretation and mild interval growth extending into the renal sinus.  Assessment & Plan:    1. Renal mass Slight interval growth of approximately 4 cm enhancing right renal mass, 2 to 3 mm over 7-month  with extension into the renal sinus.  As per previous discussion, we discussed the implications today in detail.  The lesion likely has approximately 5 mm annual growth rate at this point thus consideration of surgical intervention is warranted.  Given the location of the lesion and extension into the sinus, ablative therapy is less favorable.  We also discussed continued observation as an option as well as the risk of metastatic potential which is relatively low at this point but will rise as the lesion continues to grow.  We discussed the role of laparoscopic hand-assisted nephrectomy in detail including the intraoperative and postoperative course.  We discussed the risk of surgery especially given his comorbidities including bleeding, injury to the bowel and surrounding structures, worsened renal function, infection, hernia formation, dehiscence, amongst others.  We also discussed the risk of general anesthetic including heart attack, stroke and death.  All questions were answered.  At this point time, he strongly considering pursuing nephrectomy.  If he does, he will need to have a bowel prep prior to surgery given his history of constipation and previous bowel surgeries.  He also need clearance from his neurologist to come off of Plavix.  Currently, he is dealing with some dental issues and has several upcoming surgery scheduled for this.  He may want a wait until July or August to have surgery.  We will tentatively plan for CT scan in 6 months with follow-up and further discussion of nephrectomy at that point.  If he elects to pursue nephrectomy prior to this, he will let us know.  - CT Abdomen W Contrast; Future  2. Neurogenic bladder Managed with chronic  indwelling Foley Declined Foley exchange today, will come in on Monday to have this done Continue Myrbetriq bladder instability Recent cystoscopy reassuring  Return in about 6  months (around 06/07/2019) for CT scan.  Hollice Espy, MD  [facility] [address] [place], [place] [address] [phone]  I spent 25 min with this patient of which greater than 50% was spent in counseling and coordination of care with the patient.

## 2018-12-10 ENCOUNTER — Telehealth: Payer: Self-pay | Admitting: Internal Medicine

## 2018-12-10 ENCOUNTER — Ambulatory Visit (INDEPENDENT_AMBULATORY_CARE_PROVIDER_SITE_OTHER): Payer: Medicare PPO

## 2018-12-10 DIAGNOSIS — N39498 Other specified urinary incontinence: Secondary | ICD-10-CM

## 2018-12-10 DIAGNOSIS — N319 Neuromuscular dysfunction of bladder, unspecified: Secondary | ICD-10-CM

## 2018-12-10 NOTE — Telephone Encounter (Signed)
Please let him know that I reviewed the visit and CT scan I think he needs to proceed with having the kidney removed

## 2018-12-10 NOTE — Telephone Encounter (Signed)
Pt is scheduled for 12/13/18 for surgery consult and asks that you review the 12/30 CT scan prior to visit. He was told he needs a surgery to remove a kidney and would like to get your advice prior to agreeing.

## 2018-12-10 NOTE — Telephone Encounter (Signed)
Spoke to pt's daughter. She will pass on the message. She said he had a lot of questions and may still keep his appt on 12-13-18.

## 2018-12-10 NOTE — Progress Notes (Signed)
Cath Change/ Replacement  Patient is present today for a catheter change due to urinary retention.  4ml of water was removed from the balloon, a 16FR foley cath was removed with out difficulty.  Patient was cleaned and prepped in a sterile fashion with betadine and 2% lidocaine jelly was instilled into the urethra. A 16 FR foley cath was replaced into the bladder no complications were noted Urine return was noted 46ml and urine was clear in color. The balloon was filled with 62ml of sterile water. A night bag was attached for drainage.  A night bag was also given to the patient and patient was given instruction on how to change from one bag to another. Patient was given proper instruction on catheter care.    Preformed by: Fonnie Jarvis, CMA  Follow up: 1 month

## 2018-12-11 DIAGNOSIS — F411 Generalized anxiety disorder: Secondary | ICD-10-CM | POA: Diagnosis not present

## 2018-12-11 DIAGNOSIS — F39 Unspecified mood [affective] disorder: Secondary | ICD-10-CM | POA: Diagnosis not present

## 2018-12-11 DIAGNOSIS — J4540 Moderate persistent asthma, uncomplicated: Secondary | ICD-10-CM | POA: Diagnosis not present

## 2018-12-11 DIAGNOSIS — I2510 Atherosclerotic heart disease of native coronary artery without angina pectoris: Secondary | ICD-10-CM | POA: Diagnosis not present

## 2018-12-11 DIAGNOSIS — I10 Essential (primary) hypertension: Secondary | ICD-10-CM | POA: Diagnosis not present

## 2018-12-11 DIAGNOSIS — F329 Major depressive disorder, single episode, unspecified: Secondary | ICD-10-CM | POA: Diagnosis not present

## 2018-12-11 DIAGNOSIS — N319 Neuromuscular dysfunction of bladder, unspecified: Secondary | ICD-10-CM | POA: Diagnosis not present

## 2018-12-11 DIAGNOSIS — E871 Hypo-osmolality and hyponatremia: Secondary | ICD-10-CM | POA: Diagnosis not present

## 2018-12-11 DIAGNOSIS — G8929 Other chronic pain: Secondary | ICD-10-CM | POA: Diagnosis not present

## 2018-12-12 DIAGNOSIS — G8929 Other chronic pain: Secondary | ICD-10-CM | POA: Diagnosis not present

## 2018-12-12 DIAGNOSIS — F329 Major depressive disorder, single episode, unspecified: Secondary | ICD-10-CM | POA: Diagnosis not present

## 2018-12-12 DIAGNOSIS — F39 Unspecified mood [affective] disorder: Secondary | ICD-10-CM | POA: Diagnosis not present

## 2018-12-12 DIAGNOSIS — I10 Essential (primary) hypertension: Secondary | ICD-10-CM | POA: Diagnosis not present

## 2018-12-12 DIAGNOSIS — F411 Generalized anxiety disorder: Secondary | ICD-10-CM | POA: Diagnosis not present

## 2018-12-12 DIAGNOSIS — I2510 Atherosclerotic heart disease of native coronary artery without angina pectoris: Secondary | ICD-10-CM | POA: Diagnosis not present

## 2018-12-12 DIAGNOSIS — J4540 Moderate persistent asthma, uncomplicated: Secondary | ICD-10-CM | POA: Diagnosis not present

## 2018-12-12 DIAGNOSIS — N319 Neuromuscular dysfunction of bladder, unspecified: Secondary | ICD-10-CM | POA: Diagnosis not present

## 2018-12-12 DIAGNOSIS — E871 Hypo-osmolality and hyponatremia: Secondary | ICD-10-CM | POA: Diagnosis not present

## 2018-12-13 ENCOUNTER — Encounter: Payer: Self-pay | Admitting: Internal Medicine

## 2018-12-13 ENCOUNTER — Ambulatory Visit: Payer: Medicare PPO | Admitting: Internal Medicine

## 2018-12-13 VITALS — BP 128/70 | HR 72 | Temp 97.8°F | Ht 70.0 in | Wt 192.0 lb

## 2018-12-13 DIAGNOSIS — N2889 Other specified disorders of kidney and ureter: Secondary | ICD-10-CM

## 2018-12-13 NOTE — Progress Notes (Signed)
Subjective:    Patient ID: [name], male    DOB: 05/27/1934, 85 y.o.   MRN: [id number]  HPI Here with wife and daughter to discuss potential upcoming surgery  Has renal lesion that was found about 3 months ago Had repeat CT scan and it has grown Has extension into right renal sinus Suspicious for clear cell renal cell carcinoma with a change  Has been recommended for resection and nephrectomy by Dr Erlene Quan  Current Outpatient Medications on File Prior to Visit  Medication Sig Dispense Refill  . acetaminophen (TYLENOL) 500 MG tablet Take 1,000 mg by mouth every 6 (six) hours as needed.    Marland Kitchen albuterol (PROVENTIL HFA;VENTOLIN HFA) 108 (90 Base) MCG/ACT inhaler Inhale 2 puffs into the lungs every 6 (six) hours as needed for wheezing or shortness of breath. 1 Inhaler 3  . ALPRAZolam (XANAX) 1 MG tablet Take 0.5 tablets (0.5 mg total) by mouth 3 (three) times daily as needed for anxiety. 45 tablet 0  . Cholecalciferol (VITAMIN D3) 5000 UNITS CAPS Take 1 capsule by mouth daily.    . clopidogrel (PLAVIX) 75 MG tablet Take 75 mg by mouth daily.   4  . Coenzyme Q10 (CO Q 10) 100 MG CAPS Take 1 capsule by mouth daily.    . Cranberry 425 MG CAPS Take 2 capsules by mouth daily.    . famotidine (PEPCID) 20 MG tablet Take 1 tablet (20 mg total) by mouth daily. 30 tablet 2  . fluticasone (FLONASE) 50 MCG/ACT nasal spray Place 2 sprays into both nostrils 2 (two) times daily. 48 g 3  . folic acid (FOLVITE) 1 MG tablet Take 1 tablet (1 mg total) by mouth daily. 90 tablet 3  . furosemide (LASIX) 20 MG tablet Take 1 tablet (20 mg total) by mouth daily. 30 tablet 1  . gabapentin (NEURONTIN) 300 MG capsule TAKE 1 CAPSULE BY MOUTH THREE TIMES DAILY (Patient taking differently: Take 300 mg by mouth 3 (three) times daily. ) 90 capsule 11  . hydroxychloroquine (PLAQUENIL) 200 MG tablet Take 1 tablet by mouth 2 (two) times daily.    . methotrexate (RHEUMATREX) 2.5 MG tablet Take 10 tablets by mouth once a  week.    . Milk Thistle 250 MG CAPS Take 1 capsule by mouth daily.    . Multiple Vitamin (MULTIVITAMIN) tablet Take 1 tablet by mouth daily.      Marland Kitchen MYRBETRIQ 50 MG TB24 tablet Take 50 mg by mouth at bedtime.   11  . nystatin (MYCOSTATIN/NYSTOP) powder Apply topically 4 (four) times daily. (Patient taking differently: Apply topically 4 (four) times daily as needed (rash). ) 15 g 2  . omeprazole (PRILOSEC) 20 MG capsule TAKE 1 CAPSULE(20 MG) BY MOUTH DAILY AS NEEDED (Patient taking differently: Take 20 mg by mouth daily as needed (reflux). ) 90 capsule 3  . polyethylene glycol (GLYCOLAX) packet Take 17 g by mouth at bedtime.     . pravastatin (PRAVACHOL) 20 MG tablet TAKE 1 TABLET BY MOUTH EVERY DAY (Patient taking differently: Take 20 mg by mouth at bedtime. ) 90 tablet 3  . Probiotic Product (ALIGN) 4 MG CAPS Take 1 capsule (4 mg total) by mouth daily. 15 capsule 0  . Selenium 200 MCG CAPS Take 1 capsule by mouth daily.    . sodium chloride 1 g tablet Take 1 tablet (1 g total) by mouth 2 (two) times daily with a meal. 60 tablet 1  . Turmeric 500 MG CAPS Take 500  mg by mouth daily.    . vitamin B-12 (CYANOCOBALAMIN) 500 MCG tablet Take 500 mcg by mouth daily.     Current Facility-Administered Medications on File Prior to Visit  Medication Dose Route Frequency Provider Last Rate Last Dose  . omalizumab Arvid Right) injection 375 mg  375 mg Subcutaneous Q14 Days Chesley Mires, MD   375 mg at 10/05/18 1555  . omalizumab Arvid Right) injection 375 mg  375 mg Subcutaneous Q14 Days Chesley Mires, MD   375 mg at 11/29/18 0859    Allergies  Allergen Reactions  . Ciprofloxacin Nausea And Vomiting    Headache  . Citalopram Hydrobromide Other (See Comments)    "unknown"  . Clindamycin Nausea And Vomiting  . Lorazepam     Adverse reaction  . Paroxetine Nausea Only  . Ramipril Other (See Comments)    "unknown"  . Simvastatin   . Sulfa Antibiotics Other (See Comments)    Past Medical History:  Diagnosis  Date  . Allergy   . Anemia   . Anxiety   . Arthritis    rheumatoid  . Asthma   . Basal cell carcinoma    back  . CAD (coronary artery disease)   . Cancer [facility])    Prostate  . Chronic sinusitis   . Depression   . Diverticulitis   . ED (erectile dysfunction)   . GERD (gastroesophageal reflux disease)   . History of SIADH   . Hx of adenomatous colonic polyps   . Hypertension   . Hyponatremia   . IBS (irritable bowel syndrome)   . Kidney lesion   . Neurodermatitis   . Neurogenic bladder   . OSA (obstructive sleep apnea)     Past Surgical History:  Procedure Laterality Date  . KNEE ARTHROPLASTY Right 09/02/2015   Procedure: COMPUTER ASSISTED TOTAL KNEE ARTHROPLASTY;  Surgeon: [name], MD;  Location: [facility] ORS;  Service: Orthopedics;  Laterality: Right;  . NASAL SINUS SURGERY  2009   DEVIATED SEPTUM AND POLYPS  . permanent indwelling catheter    . PROSTATE SURGERY     PROSTATECTOMY  . TOTAL KNEE ARTHROPLASTY Left 9/15   Dr [name]  . URETHRAL STRICTURE DILATATION  02-2010   Dr.Cope    Family History  Problem Relation Age of Onset  . Heart disease Mother 37       heart failure  . Pneumonia Father 65       pnemonia  . Skin cancer Father   . Skin cancer Sister   . Skin cancer Son   . Colon cancer Neg Hx   . Esophageal cancer Neg Hx   . Stomach cancer Neg Hx   . Pancreatic cancer Neg Hx   . Liver disease Neg Hx     Social History   Socioeconomic History  . Marital status: Married    Spouse name: Not on file  . Number of children: 2  . Years of education: Not on file  . Highest education level: Not on file  Occupational History  . 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 Financial resource strain: Not on file  . Food insecurity:    Worry: Not on file    Inability: Not on file  . Transportation needs:    Medical: Not on file    Non-medical: Not on file  Tobacco Use  . Smoking status: Never Smoker  . Smokeless tobacco:  Never Used  Substance and Sexual Activity  . Alcohol use:  Yes    Comment: 5 days out of 7 drinks gin   . Drug use: No  . Sexual activity: Not Currently  Lifestyle  . Physical activity:    Days per week: Not on file    Minutes per session: Not on file  . Stress: Not on file  Relationships  . Social connections:    Talks on phone: Not on file    Gets together: Not on file    Attends religious service: Not on file    Active member of club or organization: Not on file    Attends meetings of clubs or organizations: Not on file    Relationship status: Not on file  . Intimate partner violence:    Fear of current or ex partner: Not on file    Emotionally abused: Not on file    Physically abused: Not on file    Forced sexual activity: Not on file  Other Topics Concern  . Not on file  Social History Narrative   Not sure about a living will or health care POA   Wife should make health care decisions for him--then daughter, then son   Would accept resuscitation attempts   Not sure about tube feeds   Review of Systems Has had some dental work Eating okay Weight back up some    Objective:   Physical Exam  Constitutional: No distress.  Psychiatric: He has a normal mood and affect. His behavior is normal.           Assessment & Plan:

## 2018-12-13 NOTE — Assessment & Plan Note (Signed)
Has had increased growth since the prior scan (much faster than it had from 7 years ago) This is highly suspicious for a more rapidly growing neoplasm (likely clear cell carcinoma) I reiterated that I feel this needs to be done He is an acceptable surgical risk--though he has multiple medical issues Will go to Dr Holley Raring to be sure he has no concerns (but seems to have adequate renal function to survive on 1 kidney) Discussed plans all of the 15 minute visit

## 2018-12-14 ENCOUNTER — Ambulatory Visit: Payer: Self-pay

## 2018-12-14 ENCOUNTER — Ambulatory Visit (INDEPENDENT_AMBULATORY_CARE_PROVIDER_SITE_OTHER): Payer: Medicare PPO

## 2018-12-14 DIAGNOSIS — J4540 Moderate persistent asthma, uncomplicated: Secondary | ICD-10-CM | POA: Diagnosis not present

## 2018-12-14 DIAGNOSIS — E871 Hypo-osmolality and hyponatremia: Secondary | ICD-10-CM | POA: Diagnosis not present

## 2018-12-17 ENCOUNTER — Other Ambulatory Visit: Payer: Self-pay | Admitting: Internal Medicine

## 2018-12-17 DIAGNOSIS — E871 Hypo-osmolality and hyponatremia: Secondary | ICD-10-CM | POA: Diagnosis not present

## 2018-12-17 DIAGNOSIS — N2889 Other specified disorders of kidney and ureter: Secondary | ICD-10-CM | POA: Diagnosis not present

## 2018-12-17 DIAGNOSIS — D649 Anemia, unspecified: Secondary | ICD-10-CM | POA: Diagnosis not present

## 2018-12-17 DIAGNOSIS — R600 Localized edema: Secondary | ICD-10-CM | POA: Diagnosis not present

## 2018-12-17 MED ORDER — OMALIZUMAB 150 MG ~~LOC~~ SOLR: 375.0000 mg | Freq: Once | SUBCUTANEOUS | Status: DC

## 2018-12-17 MED ORDER — OMALIZUMAB 150 MG ~~LOC~~ SOLR
375.0000 mg | Freq: Once | SUBCUTANEOUS | Status: AC
  Administered 2018-12-14: 375 mg via SUBCUTANEOUS

## 2018-12-18 DIAGNOSIS — M150 Primary generalized (osteo)arthritis: Secondary | ICD-10-CM | POA: Diagnosis not present

## 2018-12-18 DIAGNOSIS — C641 Malignant neoplasm of right kidney, except renal pelvis: Secondary | ICD-10-CM | POA: Diagnosis not present

## 2018-12-18 DIAGNOSIS — M0579 Rheumatoid arthritis with rheumatoid factor of multiple sites without organ or systems involvement: Secondary | ICD-10-CM | POA: Diagnosis not present

## 2018-12-18 DIAGNOSIS — Z79899 Other long term (current) drug therapy: Secondary | ICD-10-CM | POA: Diagnosis not present

## 2018-12-18 NOTE — Telephone Encounter (Signed)
Last filled 11-02-18 #90 Last OV 12-13-18 Next OV 03-07-19 [facility] [address] [employer]

## 2018-12-21 ENCOUNTER — Other Ambulatory Visit: Payer: Self-pay | Admitting: Internal Medicine

## 2018-12-25 DIAGNOSIS — L820 Inflamed seborrheic keratosis: Secondary | ICD-10-CM | POA: Diagnosis not present

## 2018-12-25 DIAGNOSIS — Z85828 Personal history of other malignant neoplasm of skin: Secondary | ICD-10-CM | POA: Diagnosis not present

## 2018-12-25 DIAGNOSIS — Z08 Encounter for follow-up examination after completed treatment for malignant neoplasm: Secondary | ICD-10-CM | POA: Diagnosis not present

## 2018-12-25 DIAGNOSIS — L853 Xerosis cutis: Secondary | ICD-10-CM | POA: Diagnosis not present

## 2018-12-25 DIAGNOSIS — R58 Hemorrhage, not elsewhere classified: Secondary | ICD-10-CM | POA: Diagnosis not present

## 2018-12-25 DIAGNOSIS — L538 Other specified erythematous conditions: Secondary | ICD-10-CM | POA: Diagnosis not present

## 2018-12-25 DIAGNOSIS — R208 Other disturbances of skin sensation: Secondary | ICD-10-CM | POA: Diagnosis not present

## 2018-12-26 ENCOUNTER — Other Ambulatory Visit: Payer: Self-pay | Admitting: Radiology

## 2018-12-26 ENCOUNTER — Ambulatory Visit: Payer: Medicare PPO | Admitting: Urology

## 2018-12-26 ENCOUNTER — Telehealth: Payer: Self-pay | Admitting: Radiology

## 2018-12-26 ENCOUNTER — Telehealth: Payer: Self-pay | Admitting: Cardiovascular Disease

## 2018-12-26 ENCOUNTER — Encounter: Payer: Self-pay | Admitting: Urology

## 2018-12-26 VITALS — BP 146/81 | HR 81 | Wt 195.0 lb

## 2018-12-26 DIAGNOSIS — N2889 Other specified disorders of kidney and ureter: Secondary | ICD-10-CM

## 2018-12-26 DIAGNOSIS — Z01818 Encounter for other preprocedural examination: Secondary | ICD-10-CM

## 2018-12-26 DIAGNOSIS — I2510 Atherosclerotic heart disease of native coronary artery without angina pectoris: Secondary | ICD-10-CM

## 2018-12-26 NOTE — Telephone Encounter (Signed)
   [facility] Pre-operative Risk Assessment    Request for surgical clearance:  What type of surgery is being performed?  R hand assist lparoscopic radical nephrectomy/ r renal mass  1. When is this surgery scheduled? Not listed  2. What type of clearance is required (medical clearance vs. Pharmacy clearance to hold med vs. Both)? medical  3. Are there any medications that need to be held prior to surgery and how long?not listed   4. Practice name and name of physician performing surgery? [facility] Dr. Hollice Espy  5. What is your office phone number [phone]   7.   What is your office fax number [phone]  8.   Anesthesia type (None, local, MAC, general) ? Not listed   [name] 12/26/2018, 4:07 PM  _________________________________________________________________   (provider comments below)

## 2018-12-26 NOTE — Progress Notes (Signed)
12/26/18  Urology  Patient return to the office today.  Notably, he had been seen and evaluated just 2 weeks ago on 12/07/2018 with recommendations for right nephrectomy amenable to laparoscopic hand-assisted approach.  At that point in time, he was not sure on the timing of his surgery but has ultimately elected to have it done in the near future.  He returns today accompanied by his daughter with additional questions or concerns.  Please refer to my previous note for details.  Today we primarily discussed the preoperative, intraoperative, and postoperative course including all risk and benefits and exceptional detail.  Prior to surgery, the patient does have a history of coronary artery disease and may need cardiac clearance.  He is already scheduled see his pulmonologist next month for the purpose of clearance.  He is also seen his rheumatologist with plans to hold his methotrexate around the time of surgery, at least 1 week prior and 1 week after followed by tapering down of his dosage.  He is also discussed surgery with his nephrologist.  He will also need clearance to hold his Plavix thus we will get clearance from his neurologist.  All questions were answered today.  Hollice Espy, MD  I spent 25 min with this patient of which greater than 50% was spent in counseling and coordination of care with the patient.   Specifically, 25 minutes were spent in the room with the patient and his daughter answering questions.

## 2018-12-26 NOTE — Telephone Encounter (Signed)
Patient was given the [facility] Information form below as well as the Instructions for Pre-Admission Testing form & a map of [facility].   [address], [address] [place], [place] [address] Telephone: [phone] Fax: [phone]   Thank you for choosing [facility] for your upcoming surgery!  We are always here to assist in your urological needs.  Please read the following information with specific details for your upcoming appointments related to your surgery. Please contact Caitlinn Klinker at [phone] Option 3 with any questions.  The Name of Your Surgery: right hand assist radical nephrectomy Your Surgery Date: 02/18/2019 Your Surgeon: Hollice Espy  Please call Same Day Surgery at [phone] between the hours of 1pm-3pm one day prior to your surgery. They will inform you of the time to arrive at Same Day Surgery which is located on the second floor of the [facility].   Please refer to the attached letter regarding instructions for Pre-Admission Testing. You will receive a call from the [place] office regarding your appointment with them.  The Pre-Admission Testing office is located at [place], on the first floor of the [facility] in [place] (office is to the right as you enter through the [employer] of the [facility]). Please have all medications you are currently taking and your insurance card available.   Patient was advised to have nothing to eat or drink after midnight the night prior to surgery except that he may have only water until 2 hours before surgery with nothing to drink within 2 hours of surgery.  The patient states he currently takes Plavix & methotrexate & was informed to hold both medications for 7 days prior to surgery beginning on 02/11/2019 pending clearance from physician. Patient's  questions were answered and he expressed understanding of these instructions.

## 2018-12-28 ENCOUNTER — Ambulatory Visit: Payer: Medicare PPO | Admitting: Cardiovascular Disease

## 2018-12-28 ENCOUNTER — Telehealth: Payer: Self-pay | Admitting: Pulmonary Disease

## 2018-12-28 ENCOUNTER — Encounter: Payer: Self-pay | Admitting: Cardiovascular Disease

## 2018-12-28 VITALS — BP 88/52 | HR 80 | Ht 70.0 in | Wt 191.0 lb

## 2018-12-28 DIAGNOSIS — I959 Hypotension, unspecified: Secondary | ICD-10-CM | POA: Diagnosis not present

## 2018-12-28 DIAGNOSIS — Z01818 Encounter for other preprocedural examination: Secondary | ICD-10-CM | POA: Diagnosis not present

## 2018-12-28 NOTE — Telephone Encounter (Signed)
Prefilled Syringe: #150mg  4  #75mg  2 Ordered Date: 12/28/2018 Shipping Date: 12/31/2018 afternoon

## 2018-12-28 NOTE — Progress Notes (Signed)
Cardiology Office Note   Date:  12/28/2018   ID:  [name], DOB [birth year], MRN [id number]  PCP:  [name], MD  Cardiologist:   Kathlyn Sacramento, MD   Chief Complaint  Patient presents with  . OTHER    CAD and cardiac clearance. Meds reviewed verbally with pt.      History of Present Illness: [name] is a 85 y.o. male who presents for preoperative cardiovascular evaluation before planned nephrectomy.  The patient has chronic medical conditions that include rheumatoid arthritis, history of TIA in 2019 on Plavix, hyperlipidemia, chronic hyponatremia due to suspected SIADH and osteoarthritis. He was seen by Dr. Angelena Form in 2010 for evaluation of shortness of breath but his echocardiogram was at that time unremarkable.  The patient reported at that time and he also reports to me today that he had an abnormal stress test in the past many years ago but from his description it sounds like it was overall low risk and thus he was treated medically.  He has not had any anginal symptoms.  He has no history of heart failure.  He takes furosemide for hyponatremia and not heart failure.  He denies any chest pain.  He reports history of asthma that was treated by Dr. Halford Chessman and currently he has minimal symptoms related to this.  His functional capacity is limited by arthritis.  He is not a smoker. He had an echocardiogram done in June 2019 which showed normal LV systolic function with very mild aortic stenosis and mild mitral regurgitation.    Past Medical History:  Diagnosis Date  . Allergy   . Anemia   . Anxiety   . Arthritis    rheumatoid  . Asthma   . Basal cell carcinoma    back  . CAD (coronary artery disease)   . Cancer [facility])    Prostate  . Chronic kidney disease    Mass right kidney  . Chronic sinusitis   . Depression   . Diverticulitis   . ED (erectile dysfunction)   . GERD (gastroesophageal reflux disease)   . History of SIADH   . Hx of adenomatous colonic  polyps   . Hypertension   . Hyponatremia   . IBS (irritable bowel syndrome)   . Kidney lesion   . Neurodermatitis   . Neurogenic bladder   . OSA (obstructive sleep apnea)     Past Surgical History:  Procedure Laterality Date  . KNEE ARTHROPLASTY Right 09/02/2015   Procedure: COMPUTER ASSISTED TOTAL KNEE ARTHROPLASTY;  Surgeon: [name], MD;  Location: [facility] ORS;  Service: Orthopedics;  Laterality: Right;  . NASAL SINUS SURGERY  2009   DEVIATED SEPTUM AND POLYPS  . permanent indwelling catheter    . PROSTATE SURGERY     PROSTATECTOMY  . TOTAL KNEE ARTHROPLASTY Left 9/15   Dr [name]  . URETHRAL STRICTURE DILATATION  02-2010   Dr.Cope     Current Outpatient Medications  Medication Sig Dispense Refill  . acetaminophen (TYLENOL) 500 MG tablet Take 1,000 mg by mouth every 6 (six) hours as needed.    Marland Kitchen albuterol (PROVENTIL HFA;VENTOLIN HFA) 108 (90 Base) MCG/ACT inhaler Inhale 2 puffs into the lungs every 6 (six) hours as needed for wheezing or shortness of breath. 1 Inhaler 3  . ALPRAZolam (XANAX) 0.5 MG tablet TAKE 1 TABLET BY MOUTH THREE TIMES DAILY AS NEEDED FOR ANXIETY OR SLEEP 90 tablet 0  . Cholecalciferol (VITAMIN D3) 5000 UNITS CAPS Take 1  capsule by mouth daily.    . clopidogrel (PLAVIX) 75 MG tablet Take 75 mg by mouth daily.   4  . Coenzyme Q10 (CO Q 10) 100 MG CAPS Take 1 capsule by mouth daily.    . Cranberry 425 MG CAPS Take 2 capsules by mouth daily.    . famotidine (PEPCID) 20 MG tablet Take 1 tablet (20 mg total) by mouth daily. 30 tablet 2  . fluticasone (FLONASE) 50 MCG/ACT nasal spray Place 2 sprays into both nostrils 2 (two) times daily. 48 g 3  . folic acid (FOLVITE) 1 MG tablet Take 1 tablet (1 mg total) by mouth daily. 90 tablet 3  . furosemide (LASIX) 20 MG tablet TAKE 1 TABLET BY MOUTH EVERY DAY 30 tablet 11  . gabapentin (NEURONTIN) 300 MG capsule TAKE 1 CAPSULE BY MOUTH THREE TIMES DAILY 90 capsule 11  . hydroxychloroquine (PLAQUENIL) 200 MG tablet  Take 1 tablet by mouth 2 (two) times daily.    . methotrexate (RHEUMATREX) 2.5 MG tablet Take 10 tablets by mouth once a week.    . Milk Thistle 250 MG CAPS Take 1 capsule by mouth daily.    . Multiple Vitamin (MULTIVITAMIN) tablet Take 1 tablet by mouth daily.      Marland Kitchen MYRBETRIQ 50 MG TB24 tablet Take 50 mg by mouth at bedtime.   11  . polyethylene glycol (GLYCOLAX) packet Take 17 g by mouth at bedtime.     . pravastatin (PRAVACHOL) 20 MG tablet TAKE 1 TABLET BY MOUTH EVERY DAY (Patient taking differently: Take 20 mg by mouth at bedtime. ) 90 tablet 3  . Probiotic Product (ALIGN) 4 MG CAPS Take 1 capsule (4 mg total) by mouth daily. 15 capsule 0  . Selenium 200 MCG CAPS Take 1 capsule by mouth daily.    . sodium chloride 1 g tablet Take 1 tablet (1 g total) by mouth 2 (two) times daily with a meal. (Patient taking differently: Take 1 g by mouth 3 (three) times daily with meals. ) 60 tablet 1  . Turmeric 500 MG CAPS Take 500 mg by mouth daily.    . vitamin B-12 (CYANOCOBALAMIN) 500 MCG tablet Take 500 mcg by mouth daily.     Current Facility-Administered Medications  Medication Dose Route Frequency Provider Last Rate Last Dose  . omalizumab Arvid Right) injection 375 mg  375 mg Subcutaneous Q14 Days Chesley Mires, MD   375 mg at 10/05/18 1555  . omalizumab Arvid Right) injection 375 mg  375 mg Subcutaneous Q14 Days Chesley Mires, MD   375 mg at 12/14/18 1549    Allergies:   Ciprofloxacin; Citalopram hydrobromide; Clindamycin; Lorazepam; Paroxetine; Ramipril; Simvastatin; and Sulfa antibiotics    Social History:  The patient  reports that he has never smoked. He has never used smokeless tobacco. He reports current alcohol use. He reports that he does not use drugs.   Family History:  The patient's family history includes Heart disease (age of onset: 70) in his mother; Pneumonia (age of onset: 23) in his father; Skin cancer in his father, sister, and son.    ROS:  Please see the history of present  illness.   Otherwise, review of systems are positive for none.   All other systems are reviewed and negative.    PHYSICAL EXAM: VS:  BP (!) 88/52 (BP Location: Right Arm, Patient Position: Sitting, Cuff Size: Normal)   Pulse 80   Ht 5\' 10"  (1.778 m)   Wt 191 lb (86.6 kg)  BMI 27.41 kg/m  , BMI Body mass index is 27.41 kg/m. GEN: Well nourished, well developed, in no acute distress  HEENT: normal  Neck: no JVD, carotid bruits, or masses Cardiac: RRR; no murmurs, rubs, or gallops,no edema  Respiratory:  clear to auscultation bilaterally, normal work of breathing GI: soft, nontender, nondistended, + BS MS: no deformity or atrophy  Skin: warm and dry, no rash Neuro:  Strength and sensation are intact Psych: euthymic mood, full affect   EKG:  EKG is ordered today. The ekg ordered today demonstrates normal sinus rhythm with right bundle branch block and left anterior fascicular block.   Recent Labs: 10/28/2018: ALT 15 10/29/2018: Hemoglobin 12.8; Platelets 200 11/02/2018: BUN 15; Creatinine, Ser 0.66; Potassium 4.7; Sodium 130    Lipid Panel    Component Value Date/Time   CHOL 114 05/26/2018 0411   TRIG 49 05/26/2018 0411   HDL 43 05/26/2018 0411   CHOLHDL 2.7 05/26/2018 0411   VLDL 10 05/26/2018 0411   LDLCALC 61 05/26/2018 0411      Wt Readings from Last 3 Encounters:  12/28/18 191 lb (86.6 kg)  12/26/18 195 lb (88.5 kg)  12/13/18 192 lb (87.1 kg)       PAD Screen 12/28/2018  Previous PAD dx? No  Previous surgical procedure? (No Data)  Pain with walking? Yes  Subsides with rest? Yes  Feet/toe relief with dangling? No  Painful, non-healing ulcers? No  Extremities discolored? Yes      ASSESSMENT AND PLAN:  1.  Preoperative cardiovascular evaluation for nephrectomy: The patient has no cardiac symptoms.  No symptoms of angina, heart failure or arrhythmia.  Echocardiogram in 2019 showed normal LV systolic function with no significant valvular abnormalities.  Due  to all of that, I do not think he requires further ischemic cardiac evaluation.  He can proceed at an overall low low risk from a cardiac standpoint.  2.  Hypotension: His blood pressure initially was 88/52 but I rechecked manually and his blood pressure was 102/60.  He does not have any symptoms related to this.    Disposition:   FU with me as needed  Signed,  Kathlyn Sacramento, MD  12/28/2018 3:08 PM    [facility]

## 2018-12-28 NOTE — Telephone Encounter (Signed)
Clearance form has been successfully faxed to [phone].  Patient was cleared at a low risk from a cardiac standpoint at his office visit today. See office note for detail.

## 2018-12-28 NOTE — Patient Instructions (Signed)
Medication Instructions:  No changes If you need a refill on your cardiac medications before your next appointment, please call your pharmacy.   Lab work: None ordered  Testing/Procedures: None ordered  Follow-Up: As needed with Dr. Fletcher Anon

## 2018-12-31 ENCOUNTER — Ambulatory Visit (INDEPENDENT_AMBULATORY_CARE_PROVIDER_SITE_OTHER): Payer: Medicare PPO

## 2018-12-31 DIAGNOSIS — J4540 Moderate persistent asthma, uncomplicated: Secondary | ICD-10-CM | POA: Diagnosis not present

## 2018-12-31 NOTE — Telephone Encounter (Signed)
Prefilled Syringes: # 150mg  4  #75mg  2 Arrival Date: 12/31/18 Lot #: 150mg  [id number]      75mg [id number] Exp Date: 150mg  10/2019   75mg  05/2019

## 2019-01-01 NOTE — Telephone Encounter (Signed)
Daughter states patient takes Plavix but not aspirin. Advised for patient to hold Plavix for 7 days pre-op beginning 02/11/2019 per clearance received from Dr Melrose Nakayama. Also patient needs to hold methotrexate for 7 days pre-op per Dr Erlene Quan. Questions answered. Daughter expresses understanding of conversation.

## 2019-01-02 ENCOUNTER — Ambulatory Visit: Payer: Medicare PPO | Admitting: Pulmonary Disease

## 2019-01-02 ENCOUNTER — Encounter: Payer: Self-pay | Admitting: Pulmonary Disease

## 2019-01-02 VITALS — BP 98/58 | HR 81 | Ht 70.0 in | Wt 193.8 lb

## 2019-01-02 DIAGNOSIS — Z01811 Encounter for preprocedural respiratory examination: Secondary | ICD-10-CM

## 2019-01-02 DIAGNOSIS — J4540 Moderate persistent asthma, uncomplicated: Secondary | ICD-10-CM

## 2019-01-02 MED ORDER — OMALIZUMAB 150 MG ~~LOC~~ SOLR
375.0000 mg | Freq: Once | SUBCUTANEOUS | Status: AC
  Administered 2018-12-31: 375 mg via SUBCUTANEOUS

## 2019-01-02 NOTE — Patient Instructions (Signed)
Follow up in 6 months 

## 2019-01-02 NOTE — Progress Notes (Signed)
[facility]  Chief Complaint  Patient presents with  . Surgical Clearance    right kidney removal     Constitutional:  BP (!) 98/58 (BP Location: Left Arm, Cuff Size: Normal)   Pulse 81   Ht 5\' 10"  (1.778 m)   Wt 193 lb 12.8 oz (87.9 kg)   SpO2 98%   BMI 27.81 kg/m   Past Medical History:  Rheumatoid arthritis, OA, Neurogenic bladder, IBS, Hyponatremia, HTN, Colon polyps, GERD, ED, Diverticulitis, Depression, Prostate cancer, CAD, Anxiety  Brief Summary:  [name] is a 85 y.o. male with allergic asthma and rhinitis.  He was seen in ER in August 2019 for constipation.  He had CT abdomen then that showed right renal mass.  He had been monitored for this and it appears to have increased in size on CT imaging from December 2019.  He is followed by Dr. Hollice Espy with urology in [place].  He was advised that he needs right nephrectomy.  Has some sinus congestion and post nasal drip.  This makes his throat feel irritated and then he gets a cough.  He hasn't been using his nasal irrigation or nasal spray recently.  His cough gets better when he uses this.  He is not having chest pain, fever, sputum, or wheezing.  He heard that Thayer Dallas had lung cancer and found out after he was short of breath.  When Mr. Gabay heard this on the radio he briefly also felt short of breath.  Physical Exam:   Appearance - well kempt   ENMT - clear nasal mucosa, midline nasal  septum, no oral exudates, no LAN, trachea midline  Respiratory - normal chest wall, normal respiratory effort, no accessory muscle use, no wheeze/rales  CV - s1s2 regular rate and rhythm, no murmurs, no peripheral edema, radial pulses symmetric  GI - soft, non tender, no masses  Lymph - no adenopathy noted in neck and axillary areas  MSK - uses a walker  Ext - no cyanosis, clubbing, or joint inflammation noted  Skin - no rashes, lesions, or ulcers  Neuro - normal  strength, oriented x 3  Psych - normal mood and affect   Assessment/Plan:   Allergic asthma and rhinitis. - well controlled on xolair; will coordinate timing of injections with his planned surgery - advised him to resume flonase and nasal irrigation - prn albuterol  Right renal mass. - he is at low risk for perioperative pulmonary complications and can proceed with surgery - no additional pulmonary testing needed prior to him having surgery   Patient Instructions  Follow up in 6 months  A total of  27 minutes were spent face to face with the patient and more than half of that time involved counseling or coordination of care.   Chesley Mires, MD [facility] Pager: [phone] 01/02/2019, 5:27 PM  Flow Sheet     Pulmonary tests:  PFT 04/07/10 >> FEV1 2.61 (86%), FEV1% 73, TLC 7.26 (85%), TLC 112%  Sleep tests:  CT chest 03/15/17 > atherosclerosis, 3.4 cm Lt lung base density rounded ATX, granuloma in lingula, 3 mm RLL nodule, 4 mm RLL nodule CT chest 09/18/17 >>atherosclerosis, rounded ATX LLL, stable 4 mm RLL nodule, calcified granuloma in lingula, stable 4 mm RLL nodule  Cardiac tests:  Echo 05/26/18 >> EF 60 to 65%, grade 1 DD, mild AS, mild AR, mild MR  Medications:   Allergies as of 01/02/2019  Reactions   Ciprofloxacin Nausea And Vomiting   Headache   Citalopram Hydrobromide Other (See Comments)   "unknown"   Clindamycin Nausea And Vomiting   Lorazepam    Adverse reaction   Paroxetine Nausea Only   Ramipril Other (See Comments)   "unknown"   Simvastatin    Sulfa Antibiotics Other (See Comments)      Medication List       Accurate as of January 02, 2019  5:27 PM. Always use your most recent med list.        acetaminophen 500 MG tablet Commonly known as:  TYLENOL Take 1,000 mg by mouth every 6 (six) hours as needed.   albuterol 108 (90 Base) MCG/ACT inhaler Commonly known as:  PROVENTIL HFA;VENTOLIN HFA Inhale 2 puffs into  the lungs every 6 (six) hours as needed for wheezing or shortness of breath.   ALIGN 4 MG Caps Take 1 capsule (4 mg total) by mouth daily.   ALPRAZolam 0.5 MG tablet Commonly known as:  XANAX TAKE 1 TABLET BY MOUTH THREE TIMES DAILY AS NEEDED FOR ANXIETY OR SLEEP   clopidogrel 75 MG tablet Commonly known as:  PLAVIX Take 75 mg by mouth daily.   Co Q 10 100 MG Caps Take 1 capsule by mouth daily.   Cranberry 425 MG Caps Take 2 capsules by mouth daily.   famotidine 20 MG tablet Commonly known as:  PEPCID Take 1 tablet (20 mg total) by mouth daily.   fluticasone 50 MCG/ACT nasal spray Commonly known as:  FLONASE Place 2 sprays into both nostrils 2 (two) times daily.   folic acid 1 MG tablet Commonly known as:  FOLVITE Take 1 tablet (1 mg total) by mouth daily.   furosemide 20 MG tablet Commonly known as:  LASIX TAKE 1 TABLET BY MOUTH EVERY DAY   gabapentin 300 MG capsule Commonly known as:  NEURONTIN TAKE 1 CAPSULE BY MOUTH THREE TIMES DAILY   hydroxychloroquine 200 MG tablet Commonly known as:  PLAQUENIL Take 1 tablet by mouth 2 (two) times daily.   methotrexate 2.5 MG tablet Commonly known as:  RHEUMATREX Take 10 tablets by mouth once a week.   Milk Thistle 250 MG Caps Take 1 capsule by mouth daily.   multivitamin tablet Take 1 tablet by mouth daily.   MYRBETRIQ 50 MG Tb24 tablet Generic drug:  mirabegron ER Take 50 mg by mouth at bedtime.   polyethylene glycol packet Commonly known as:  MIRALAX / GLYCOLAX Take 17 g by mouth at bedtime.   pravastatin 20 MG tablet Commonly known as:  PRAVACHOL TAKE 1 TABLET BY MOUTH EVERY DAY   Selenium 200 MCG Caps Take 1 capsule by mouth daily.   sodium chloride 1 g tablet Take 1 tablet (1 g total) by mouth 2 (two) times daily with a meal.   Turmeric 500 MG Caps Take 500 mg by mouth daily.   vitamin B-12 500 MCG tablet Commonly known as:  CYANOCOBALAMIN Take 500 mcg by mouth daily.   Vitamin D3 125 MCG  (5000 UT) Caps Take 1 capsule by mouth daily.       Past Surgical History:  He  has a past surgical history that includes Prostate surgery; Nasal sinus surgery (2009); Urethra dilation (11-6107); Total knee arthroplasty (Left, 9/15); Knee Arthroplasty (Right, 09/02/2015); and permanent indwelling catheter.  Family History:  His family history includes Heart disease (age of onset: 44) in his mother; Pneumonia (age of onset: 16) in his father; Skin cancer in his  father, sister, and son.  Social History:  He  reports that he has never smoked. He has never used smokeless tobacco. He reports current alcohol use. He reports that he does not use drugs.

## 2019-01-07 ENCOUNTER — Ambulatory Visit (INDEPENDENT_AMBULATORY_CARE_PROVIDER_SITE_OTHER): Payer: Medicare PPO

## 2019-01-07 DIAGNOSIS — N319 Neuromuscular dysfunction of bladder, unspecified: Secondary | ICD-10-CM

## 2019-01-09 NOTE — Progress Notes (Signed)
Cath Change/ Replacement  Patient is present today for a catheter change due to urinary retention.  41ml of water was removed from the balloon, a 16FR foley cath was removed with out difficulty.  Patient was cleaned and prepped in a sterile fashion with betadine and 2% lidocaine jelly was instilled into the urethra. A 16 FR foley cath was replaced into the bladder complications were noted as: patient experienced bladder spasm during exchange Urine return was noted 84ml and urine was yellow in color. The balloon was filled with 38ml of sterile water. A leg bag was attached for drainage.  A night bag was also given to the patient and patient was given instruction on how to change from one bag to another. Patient was given proper instruction on catheter care.    Preformed by: Fonnie Jarvis, CMA  Follow up: 1 month

## 2019-01-11 ENCOUNTER — Other Ambulatory Visit: Payer: Self-pay | Admitting: Radiology

## 2019-01-15 ENCOUNTER — Ambulatory Visit (INDEPENDENT_AMBULATORY_CARE_PROVIDER_SITE_OTHER): Payer: Medicare PPO

## 2019-01-15 DIAGNOSIS — J4540 Moderate persistent asthma, uncomplicated: Secondary | ICD-10-CM | POA: Diagnosis not present

## 2019-01-16 MED ORDER — OMALIZUMAB 150 MG ~~LOC~~ SOLR
375.0000 mg | Freq: Once | SUBCUTANEOUS | Status: AC
  Administered 2019-01-16: 375 mg via SUBCUTANEOUS

## 2019-01-16 NOTE — Progress Notes (Signed)
Pt's injections was given on 01/15/2019.

## 2019-01-17 ENCOUNTER — Ambulatory Visit: Payer: Self-pay

## 2019-01-23 ENCOUNTER — Other Ambulatory Visit: Payer: Self-pay | Admitting: Urology

## 2019-01-29 ENCOUNTER — Telehealth: Payer: Self-pay | Admitting: Pulmonary Disease

## 2019-01-29 NOTE — Telephone Encounter (Signed)
Prefilled Syringes: # 150mg  4  #75mg  2 Arrival Date: 01/29/2019 Lot #: 150mg  [id number]      75mg  [id number] Exp Date: 150mg  10/2019   75mg  10/2019

## 2019-01-30 DIAGNOSIS — J329 Chronic sinusitis, unspecified: Secondary | ICD-10-CM | POA: Diagnosis not present

## 2019-01-30 DIAGNOSIS — H6123 Impacted cerumen, bilateral: Secondary | ICD-10-CM | POA: Diagnosis not present

## 2019-01-31 ENCOUNTER — Ambulatory Visit (INDEPENDENT_AMBULATORY_CARE_PROVIDER_SITE_OTHER): Payer: Medicare PPO

## 2019-01-31 DIAGNOSIS — J4540 Moderate persistent asthma, uncomplicated: Secondary | ICD-10-CM | POA: Diagnosis not present

## 2019-01-31 MED ORDER — OMALIZUMAB 150 MG ~~LOC~~ SOLR
150.0000 mg | Freq: Once | SUBCUTANEOUS | Status: AC
  Administered 2019-01-31: 375 mg via SUBCUTANEOUS

## 2019-01-31 NOTE — Progress Notes (Signed)
I thought I put 375mg  in, not 150mg . It should be 375mg .

## 2019-02-02 ENCOUNTER — Other Ambulatory Visit: Payer: Self-pay | Admitting: Internal Medicine

## 2019-02-04 ENCOUNTER — Other Ambulatory Visit: Payer: Self-pay

## 2019-02-04 ENCOUNTER — Encounter
Admission: RE | Admit: 2019-02-04 | Discharge: 2019-02-04 | Disposition: A | Discharge status: Home / Self Care | Payer: Medicare PPO | Source: Ambulatory Visit | Attending: Urology | Admitting: Urology

## 2019-02-04 ENCOUNTER — Other Ambulatory Visit: Payer: Self-pay | Admitting: Radiology

## 2019-02-04 DIAGNOSIS — N2889 Other specified disorders of kidney and ureter: Secondary | ICD-10-CM

## 2019-02-04 DIAGNOSIS — Z01812 Encounter for preprocedural laboratory examination: Secondary | ICD-10-CM | POA: Diagnosis not present

## 2019-02-04 DIAGNOSIS — Z01818 Encounter for other preprocedural examination: Secondary | ICD-10-CM

## 2019-02-04 LAB — CBC
HCT: 33.6 % — ABNORMAL LOW (ref 39.0–52.0)
Hemoglobin: 11.1 g/dL — ABNORMAL LOW (ref 13.0–17.0)
MCH: 32.1 pg (ref 26.0–34.0)
MCHC: 33 g/dL (ref 30.0–36.0)
MCV: 97.1 fL (ref 80.0–100.0)
Platelets: 203 10*3/uL (ref 150–400)
RBC: 3.46 MIL/uL — ABNORMAL LOW (ref 4.22–5.81)
RDW: 14 % (ref 11.5–15.5)
WBC: 5 10*3/uL (ref 4.0–10.5)
nRBC: 0 % (ref 0.0–0.2)

## 2019-02-04 LAB — TYPE AND SCREEN
ABO/RH(D): A POS
Antibody Screen: NEGATIVE

## 2019-02-04 LAB — BASIC METABOLIC PANEL
Anion gap: 9 (ref 5–15)
BUN: 18 mg/dL (ref 8–23)
CHLORIDE: 100 mmol/L (ref 98–111)
CO2: 26 mmol/L (ref 22–32)
Calcium: 8.5 mg/dL — ABNORMAL LOW (ref 8.9–10.3)
Creatinine, Ser: 0.57 mg/dL — ABNORMAL LOW (ref 0.61–1.24)
GFR calc Af Amer: >60 mL/min (ref >60)
GFR calc non Af Amer: >60 mL/min (ref >60)
Glucose, Bld: 93 mg/dL (ref 70–99)
Potassium: 4 mmol/L (ref 3.5–5.1)
Sodium: 135 mmol/L (ref 135–145)

## 2019-02-04 LAB — PROTIME-INR
INR: 1.1 (ref 0.8–1.2)
Prothrombin Time: 14.3 seconds (ref 11.4–15.2)

## 2019-02-04 NOTE — Telephone Encounter (Signed)
Last filled 12-18-18 #90 Last OV 12-13-18 Next OV 03-07-19 [facility] [place]

## 2019-02-04 NOTE — Patient Instructions (Signed)
  Your procedure is scheduled on: Monday February 18, 2019 Report to Same Day Surgery 2nd floor [facility] Entrance-take elevator on left to 2nd floor.  Check in with surgery information desk.) To find out your arrival time, call [phone] 1:00-3:00 PM on Friday February 15, 2019  Remember: Instructions that are not followed completely may result in serious medical risk, up to and including death, or upon the discretion of your surgeon and anesthesiologist your surgery may need to be rescheduled.    __x__ 1. Do not eat food (including mints, candies, chewing gum) after midnight the night before your procedure. You may drink clear liquids up to 2 hours before you are scheduled to arrive at the hospital for your procedure.  Do not drink anything within 2 hours of your scheduled arrival to the hospital.  Approved clear liquids:  --Water or Apple juice without pulp  --Clear carbohydrate beverage such as Gatorade or Powerade  --Black Coffee or Clear Tea (No milk, no creamers, do not add anything to the coffee or tea)    __x__ 2. No Alcohol for 24 hours before or after surgery.   __x__ 3. No Smoking or e-cigarettes for 24 hours before surgery.  Do not use any chewable tobacco products for at least 6 hours before surgery.   __x__ 4. Notify your doctor if there is any change in your medical condition (cold, fever, infections).   __x__ 5. On the morning of surgery brush your teeth with toothpaste and water.  You may rinse your mouth with mouthwash if you wish.  Do not swallow any toothpaste or mouthwash.  Please read over the following fact sheets that you were given:   [facility] Preparing for Surgery and/or MRSA Information    __x__ Use CHG Soap or Sage wipes as directed on instruction sheet.   Do not wear jewelry on the day of surgery.  Do not wear lotions, powders, deodorant, or perfumes.   Do not shave below the face/neck 48 hours prior to surgery.   Do not bring valuables  to the hospital.    [facility] is not responsible for any belongings or valuables.               Contacts, dentures or bridgework may not be worn into surgery.  Leave your suitcase in the car. After surgery it may be brought to your room.  For patients admitted to the hospital, discharge time is determined by your treatment team.  __x__ Take these medicines on the morning of surgery with a SMALL SIP OF WATER.   1. Pepcid (Famotidine)  2. Gabapentin (Neurontin)   Do not take your Furosemide (Lasix)   __X__ Use inhalers (Albuterol) on the day of surgery and bring them with you to the hospital.  __x__ Follow recommendations from Cardiologist, Pulmonologist or PCP regarding stopping Aspirin, Coumadin, Plavix, Eliquis, Effient, Pradaxa, and Plet.  __x__ At least one week prior to surgery: Stop taking Anti-inflammatories such as Advil, Ibuprofen, Motrin, Aleve, Naproxen, Naprosyn, BC/Goodies powders or aspirin products. You may continue to take Tylenol and Celebrex.   __x__ At least one week before surgery: Stop over the counter supplements/vitamins (Coq10, Cranberry extract, Milk Thistle, Selenium, Turmeric) until after surgery.

## 2019-02-05 ENCOUNTER — Ambulatory Visit: Payer: Medicare PPO

## 2019-02-07 ENCOUNTER — Other Ambulatory Visit: Payer: Self-pay

## 2019-02-07 ENCOUNTER — Ambulatory Visit (INDEPENDENT_AMBULATORY_CARE_PROVIDER_SITE_OTHER): Payer: Medicare PPO

## 2019-02-07 DIAGNOSIS — N2889 Other specified disorders of kidney and ureter: Secondary | ICD-10-CM

## 2019-02-07 NOTE — Progress Notes (Signed)
Cath Change/ Replacement  Patient is present today for a catheter change due to urinary retention.  100ml of water was removed from the balloon, a 16FR foley cath was removed with out difficulty.  Patient was cleaned and prepped in a sterile fashion with betadine and 2% lidocaine jelly was instilled into the urethra. A 16 FR foley cath was replaced into the bladder no complications were noted Urine return was noted 51ml and urine was pink in color. The balloon was filled with 4ml of sterile water. A leg bag was attached for drainage.  A night bag was also given to the patient and patient was given instruction on how to change from one bag to another. Patient was given proper instruction on catheter care.    Preformed by: Fonnie Jarvis, CMA  Follow up: 1 month cath exchange, UA and CX were collected today from new foley after exchange. Collected for pre-op

## 2019-02-12 ENCOUNTER — Telehealth: Payer: Self-pay | Admitting: Radiology

## 2019-02-12 DIAGNOSIS — N390 Urinary tract infection, site not specified: Secondary | ICD-10-CM

## 2019-02-12 LAB — CULTURE, URINE COMPREHENSIVE

## 2019-02-12 MED ORDER — CIPROFLOXACIN HCL 500 MG PO TABS: 500.0000 mg | ORAL_TABLET | Freq: Two times a day (BID) | ORAL | 0 refills | Status: DC

## 2019-02-12 NOTE — Telephone Encounter (Signed)
Notified patient of Dr Cherrie Gauze note below & script sent to pharmacy. Instructions for taking medication were given. Patient states he already has Zofran at home and that he feels that previous nausea and vomiting with Cipro were caused by underlying illness instead of the medication. Questions answered. Patient expresses understanding of conversation.

## 2019-02-12 NOTE — Telephone Encounter (Signed)
-----   Message from Hollice Espy, MD sent at 02/12/2019  2:32 PM EDT ----- This patient is chronically colonized but grew Pseudomonas in his urine.  As a precaution prior to surgery, I would like to start him on Cipro 500 mg twice daily starting on Friday x 5 days to reduce his bacterial load prior to surgery and reduce infectious complications.  I understand that his allergy is nausea and vomiting, unfortunately based on the sensitivities and his other allergy profiles, this would be the best medication.  If he needs to be given Zofran to tolerate this, we can do so.  Hollice Espy, MD

## 2019-02-13 ENCOUNTER — Telehealth: Payer: Self-pay | Admitting: Radiology

## 2019-02-13 NOTE — Telephone Encounter (Signed)
Notified patient that surgery will be postponed as a precaution per hospital administration. Patient will be contacted to reschedule when it has been deemed safe to do so. Questions answered. Patient expresses understanding of conversation.

## 2019-02-14 ENCOUNTER — Telehealth: Payer: Self-pay | Admitting: Pulmonary Disease

## 2019-02-15 ENCOUNTER — Ambulatory Visit (INDEPENDENT_AMBULATORY_CARE_PROVIDER_SITE_OTHER): Payer: Medicare PPO

## 2019-02-15 ENCOUNTER — Other Ambulatory Visit: Payer: Self-pay

## 2019-02-15 DIAGNOSIS — J4540 Moderate persistent asthma, uncomplicated: Secondary | ICD-10-CM

## 2019-02-15 MED ORDER — OMALIZUMAB 150 MG ~~LOC~~ SOLR
375.0000 mg | Freq: Once | SUBCUTANEOUS | Status: AC
  Administered 2019-02-15: 375 mg via SUBCUTANEOUS

## 2019-02-18 ENCOUNTER — Inpatient Hospital Stay: Admit: 2019-02-18 | Payer: Medicare PPO | Source: Ambulatory Visit | Admitting: Urology

## 2019-02-18 ENCOUNTER — Encounter: Payer: Self-pay | Source: Ambulatory Visit

## 2019-02-18 ENCOUNTER — Inpatient Hospital Stay: Payer: Self-pay

## 2019-02-18 SURGERY — NEPHRECTOMY, HAND-ASSISTED, LAPAROSCOPIC
Anesthesia: Choice | Laterality: Right

## 2019-02-18 MED ORDER — OMALIZUMAB 150 MG ~~LOC~~ SOLR
375.0000 mg | Freq: Once | SUBCUTANEOUS | Status: AC
  Administered 2019-02-15: 375 mg via SUBCUTANEOUS

## 2019-02-18 NOTE — Progress Notes (Signed)
I couldn't put charge in [place] 02/15/2019, the only thing that showed up in wrap up was the injections. Please void or correct encounter.

## 2019-02-19 ENCOUNTER — Telehealth: Payer: Self-pay | Admitting: Pulmonary Disease

## 2019-02-19 NOTE — Telephone Encounter (Signed)
Pt has an appt. 03/04/2019. That's exactly 15 days from his last shot. If he were to get it sooner than that his ins. Won't pay for his injs.. Called pt. To make him aware, pt. Understood. Nothing further needed.

## 2019-02-19 NOTE — Telephone Encounter (Signed)
Prefilled Syringe: #150mg  4  #75mg  2 Ordered Date: 02/18/2019 Shipping Date: 02/18/2019

## 2019-02-19 NOTE — Telephone Encounter (Signed)
Prefilled Syringes: # 150mg  4  #75mg  2 Arrival Date: 02/19/2019 Lot #: 150mg  [id number]      75mg  [id number] Exp Date: 150mg  12/2019   75mg  10/2019

## 2019-02-19 NOTE — Telephone Encounter (Signed)
Patient is wanting to schedule injection for Xolair injections as soon as possible. TS please advise. Thank you

## 2019-02-25 ENCOUNTER — Telehealth: Payer: Self-pay | Admitting: Pulmonary Disease

## 2019-02-25 MED ORDER — ALBUTEROL SULFATE HFA 108 (90 BASE) MCG/ACT IN AERS: 2.0000 | INHALATION_SPRAY | Freq: Four times a day (QID) | RESPIRATORY_TRACT | 3 refills | Status: DC | PRN

## 2019-02-25 NOTE — Telephone Encounter (Signed)
Spoke to pt.  Sent script to preferred pharmacy.  Nothing further is needed.

## 2019-02-27 ENCOUNTER — Telehealth: Payer: Self-pay | Admitting: Urology

## 2019-02-27 NOTE — Telephone Encounter (Signed)
Pt called and needs his cath changed and he needs an afternoon appt, He would like for Sarah to change it for him. Please Advise.

## 2019-03-04 ENCOUNTER — Ambulatory Visit (INDEPENDENT_AMBULATORY_CARE_PROVIDER_SITE_OTHER): Payer: Medicare PPO

## 2019-03-04 ENCOUNTER — Other Ambulatory Visit: Payer: Self-pay

## 2019-03-04 DIAGNOSIS — J4540 Moderate persistent asthma, uncomplicated: Secondary | ICD-10-CM

## 2019-03-04 MED ORDER — OMALIZUMAB 150 MG ~~LOC~~ SOLR
375.0000 mg | Freq: Once | SUBCUTANEOUS | Status: AC
  Administered 2019-03-04: 16:00:00 375 mg via SUBCUTANEOUS

## 2019-03-06 ENCOUNTER — Other Ambulatory Visit: Payer: Self-pay

## 2019-03-06 ENCOUNTER — Ambulatory Visit (INDEPENDENT_AMBULATORY_CARE_PROVIDER_SITE_OTHER): Payer: Medicare PPO

## 2019-03-06 DIAGNOSIS — N319 Neuromuscular dysfunction of bladder, unspecified: Secondary | ICD-10-CM

## 2019-03-06 NOTE — Progress Notes (Signed)
Cath Change/ Replacement  Patient is present today for a catheter change due to urinary retention.  28ml of water was removed from the balloon, a 16FR foley cath was removed with out difficulty.  Patient was cleaned and prepped in a sterile fashion with betadine and 2% lidocaine jelly was instilled into the urethra. A 16 FR foley cath was replaced into the bladder no complications were noted Urine return was noted 25ml and urine was light pink in color. The balloon was filled with 34ml of sterile water. A leg bag was attached for drainage.  A night bag was also given to the patient and patient was given instruction on how to change from one bag to another. Patient was given proper instruction on catheter care.    Preformed by: Fonnie Jarvis, CMA  Follow up: 1 month exchange , patient refused Home Health care exchange

## 2019-03-07 ENCOUNTER — Ambulatory Visit: Payer: Self-pay | Admitting: Internal Medicine

## 2019-03-15 ENCOUNTER — Other Ambulatory Visit: Payer: Self-pay | Admitting: Internal Medicine

## 2019-03-18 NOTE — Telephone Encounter (Signed)
Last filled 02-04-19 #90 Last OV 12-13-18 Next OV 05-09-19 [facility] [address] [employer]

## 2019-03-19 ENCOUNTER — Ambulatory Visit: Payer: Medicare PPO | Admitting: Urology

## 2019-03-21 ENCOUNTER — Ambulatory Visit: Payer: Self-pay

## 2019-03-21 DIAGNOSIS — N2889 Other specified disorders of kidney and ureter: Secondary | ICD-10-CM | POA: Diagnosis not present

## 2019-03-21 DIAGNOSIS — E871 Hypo-osmolality and hyponatremia: Secondary | ICD-10-CM | POA: Diagnosis not present

## 2019-03-21 DIAGNOSIS — D649 Anemia, unspecified: Secondary | ICD-10-CM | POA: Diagnosis not present

## 2019-03-21 DIAGNOSIS — R600 Localized edema: Secondary | ICD-10-CM | POA: Diagnosis not present

## 2019-03-22 ENCOUNTER — Other Ambulatory Visit: Payer: Self-pay

## 2019-03-22 ENCOUNTER — Ambulatory Visit (INDEPENDENT_AMBULATORY_CARE_PROVIDER_SITE_OTHER): Payer: Medicare PPO

## 2019-03-22 DIAGNOSIS — J4550 Severe persistent asthma, uncomplicated: Secondary | ICD-10-CM

## 2019-03-22 MED ORDER — OMALIZUMAB 150 MG/ML ~~LOC~~ SOSY: 300.0000 mg | PREFILLED_SYRINGE | Freq: Once | SUBCUTANEOUS | Status: DC

## 2019-03-22 MED ORDER — OMALIZUMAB 75 MG/0.5ML ~~LOC~~ SOSY: 75.0000 mg | PREFILLED_SYRINGE | Freq: Once | SUBCUTANEOUS | Status: DC

## 2019-03-22 MED ORDER — OMALIZUMAB 150 MG ~~LOC~~ SOLR
375.0000 mg | Freq: Once | SUBCUTANEOUS | Status: AC
  Administered 2019-03-22: 17:00:00 375 mg via SUBCUTANEOUS

## 2019-03-22 NOTE — Patient Instructions (Signed)
pt came in on 4//24/2020 to receive a Xolair injection. The patient is given 375mg  every 15 days. Due to each vial equalling 150mg , 15mg  of medication was wasted.

## 2019-03-22 NOTE — Progress Notes (Signed)
Have you been hospitalized within the last 10 days?  No Do you have a fever?  No Do you have a cough?  No Do you have a headache or sore throat? No  

## 2019-03-25 ENCOUNTER — Other Ambulatory Visit (HOSPITAL_COMMUNITY): Payer: Self-pay | Admitting: Nephrology

## 2019-03-25 ENCOUNTER — Other Ambulatory Visit: Payer: Self-pay | Admitting: Nephrology

## 2019-03-25 DIAGNOSIS — I871 Compression of vein: Secondary | ICD-10-CM

## 2019-03-25 DIAGNOSIS — R600 Localized edema: Secondary | ICD-10-CM

## 2019-03-25 DIAGNOSIS — N2889 Other specified disorders of kidney and ureter: Secondary | ICD-10-CM

## 2019-03-26 ENCOUNTER — Other Ambulatory Visit (HOSPITAL_COMMUNITY): Payer: Self-pay | Admitting: Nephrology

## 2019-03-26 DIAGNOSIS — C641 Malignant neoplasm of right kidney, except renal pelvis: Secondary | ICD-10-CM | POA: Diagnosis not present

## 2019-03-26 DIAGNOSIS — R609 Edema, unspecified: Secondary | ICD-10-CM | POA: Diagnosis not present

## 2019-03-26 DIAGNOSIS — M0579 Rheumatoid arthritis with rheumatoid factor of multiple sites without organ or systems involvement: Secondary | ICD-10-CM | POA: Diagnosis not present

## 2019-03-27 ENCOUNTER — Telehealth: Payer: Self-pay | Admitting: Cardiovascular Disease

## 2019-03-27 ENCOUNTER — Ambulatory Visit
Admission: RE | Admit: 2019-03-27 | Discharge: 2019-03-27 | Disposition: A | Discharge status: Home / Self Care | Payer: Medicare PPO | Source: Ambulatory Visit | Attending: Nephrology | Admitting: Nephrology

## 2019-03-27 ENCOUNTER — Ambulatory Visit: Payer: Medicare PPO

## 2019-03-27 ENCOUNTER — Other Ambulatory Visit: Payer: Self-pay

## 2019-03-27 DIAGNOSIS — G4730 Sleep apnea, unspecified: Secondary | ICD-10-CM | POA: Diagnosis not present

## 2019-03-27 DIAGNOSIS — I083 Combined rheumatic disorders of mitral, aortic and tricuspid valves: Secondary | ICD-10-CM | POA: Diagnosis not present

## 2019-03-27 DIAGNOSIS — K219 Gastro-esophageal reflux disease without esophagitis: Secondary | ICD-10-CM | POA: Diagnosis not present

## 2019-03-27 DIAGNOSIS — R600 Localized edema: Secondary | ICD-10-CM | POA: Insufficient documentation

## 2019-03-27 DIAGNOSIS — N2889 Other specified disorders of kidney and ureter: Secondary | ICD-10-CM | POA: Insufficient documentation

## 2019-03-27 DIAGNOSIS — I10 Essential (primary) hypertension: Secondary | ICD-10-CM | POA: Diagnosis not present

## 2019-03-27 DIAGNOSIS — I871 Compression of vein: Secondary | ICD-10-CM | POA: Insufficient documentation

## 2019-03-27 DIAGNOSIS — D649 Anemia, unspecified: Secondary | ICD-10-CM | POA: Insufficient documentation

## 2019-03-27 DIAGNOSIS — I4891 Unspecified atrial fibrillation: Secondary | ICD-10-CM | POA: Insufficient documentation

## 2019-03-27 DIAGNOSIS — F419 Anxiety disorder, unspecified: Secondary | ICD-10-CM | POA: Diagnosis not present

## 2019-03-27 NOTE — Telephone Encounter (Signed)
Follow Up:      Returning your call from today. 

## 2019-03-27 NOTE — Progress Notes (Signed)
*  PRELIMINARY RESULTS* Echocardiogram 2D Echocardiogram has been performed.  [name] 03/27/2019, 10:58 AM

## 2019-03-28 ENCOUNTER — Encounter: Payer: Self-pay | Admitting: Nurse Practitioner

## 2019-03-28 ENCOUNTER — Telehealth (INDEPENDENT_AMBULATORY_CARE_PROVIDER_SITE_OTHER): Payer: Medicare PPO | Admitting: Nurse Practitioner

## 2019-03-28 VITALS — BP 134/60 | Ht 71.0 in | Wt 194.0 lb

## 2019-03-28 DIAGNOSIS — I4819 Other persistent atrial fibrillation: Secondary | ICD-10-CM | POA: Diagnosis not present

## 2019-03-28 DIAGNOSIS — I5031 Acute diastolic (congestive) heart failure: Secondary | ICD-10-CM

## 2019-03-28 DIAGNOSIS — I10 Essential (primary) hypertension: Secondary | ICD-10-CM

## 2019-03-28 MED ORDER — APIXABAN 5 MG PO TABS: 5.0000 mg | ORAL_TABLET | Freq: Two times a day (BID) | ORAL | 6 refills | Status: DC

## 2019-03-28 MED ORDER — METOPROLOL TARTRATE 25 MG PO TABS: 25.0000 mg | ORAL_TABLET | Freq: Two times a day (BID) | ORAL | 3 refills | Status: DC

## 2019-03-28 NOTE — Patient Instructions (Signed)
It was a pleasure to speak with you on the phone today! Thank you for allowing [place] to continue taking care of your [facility] needs during this time.   Feel free to call as needed for questions and concerns related to your cardiac needs.   Medication Instructions:  Your physician has recommended you make the following change in your medication:  1- STOP Plavix today 2- START Metoprolol 1 tablet (25 mg) twice daily. 3- START Eliquis 1 tablet (5 mg) twice daily.  If you need a refill on your cardiac medications before your next appointment, please call your pharmacy.   Lab work: Please have lab work at your next office visit with Dr. Fletcher Anon in 3 weeks. (CBC, TSH, BMET) If you have labs (blood work) drawn today and your tests are completely normal, you will receive your results only by: Marland Kitchen MyChart Message (if you have MyChart) OR . A paper copy in the mail If you have any lab test that is abnormal or we need to change your treatment, we will call you to review the results.  Testing/Procedures: Please have an EKG at your next office visit with Dr. Fletcher Anon in 3 weeks.   Follow-Up: At [facility], you and your health needs are our priority.  As part of our continuing mission to provide you with exceptional heart care, we have created designated Provider Care Teams.  These Care Teams include your primary Cardiologist (physician) and Advanced Practice Providers (APPs -  Physician Assistants and Nurse Practitioners) who all work together to provide you with the care you need, when you need it. You will need a follow up appointment in 3 weeks. Please see Dr. Fletcher Anon in office.

## 2019-03-28 NOTE — Telephone Encounter (Signed)
Attempted call to obtain consent to e visit scheduled for this afternoon. No answer, LMTCB.

## 2019-03-28 NOTE — Progress Notes (Signed)
Virtual Visit via Video Note   This visit type was conducted due to national recommendations for restrictions regarding the COVID-19 Pandemic (e.g. social distancing) in an effort to limit this patient's exposure and mitigate transmission in our community.  Due to his co-morbid illnesses, this patient is at least at moderate risk for complications without adequate follow up.  This format is felt to be most appropriate for this patient at this time.  All issues noted in this document were discussed and addressed.  A limited physical exam was performed with this format.  Please refer to the patient's chart for his consent to telehealth for [facility]. Evaluation Performed:  Follow-up visit  This visit type was conducted due to national recommendations for restrictions regarding the COVID-19 Pandemic (e.g. social distancing).  This format is felt to be most appropriate for this patient at this time.  All issues noted in this document were discussed and addressed.  No physical exam was performed (except for noted visual exam findings with Video Visits).  Please refer to the patient's chart (MyChart message for video visits and phone note for telephone visits) for the patient's consent to telehealth for [facility]. _____________   Date:  03/28/2019   Patient ID:  JACORI MULROONEY, DOB September 26, 1934, MRN [id number] Patient Location:  Home  Provider location:   office  Primary Care Provider:  [name], MD Primary Cardiologist:  Kathlyn Sacramento, MD  Chief Complaint    85 year old male with a history of rheumatoid arthritis, TIA in 2019 on Plavix, hyperlipidemia, and chronic hyponatremia due to suspected SIADH, who presents for follow-up related to new onset A. fib and heart failure.  Past Medical History    Past Medical History:  Diagnosis Date  . (HFpEF) heart failure with preserved ejection fraction ([place])    a. 02/2019 Echo: Ef 60-65%, mildly reduced RV fxn. RVSP 108mmHg. Mildly dil  LA. Mod dil RA. Mild to mod TR. Mild to mod AS. Triv AI.  Marland Kitchen Allergy   . Anemia   . Anxiety   . Arthritis    rheumatoid  . Asthma   . Basal cell carcinoma    back  . CAD (coronary artery disease)   . Cancer [facility])    Prostate  . Chronic kidney disease    Mass right kidney  . Chronic sinusitis   . Depression   . Diverticulitis   . ED (erectile dysfunction)   . GERD (gastroesophageal reflux disease)   . History of SIADH   . Hx of adenomatous colonic polyps   . Hypertension   . Hyponatremia   . IBS (irritable bowel syndrome)   . Neurodermatitis   . Neurogenic bladder   . OSA (obstructive sleep apnea)   . Persistent atrial fibrillation    a. Dx 02/2019; b. CHA2DS2VASc = 6-->eliquis initiated.  . Renal mass    10/2018 4.2cm R renal cortical mass. Most compatible w/ clear cell renal cell carcinoma.   Past Surgical History:  Procedure Laterality Date  . KNEE ARTHROPLASTY Right 09/02/2015   Procedure: COMPUTER ASSISTED TOTAL KNEE ARTHROPLASTY;  Surgeon: [name], MD;  Location: [facility] ORS;  Service: Orthopedics;  Laterality: Right;  . NASAL SINUS SURGERY  2009   DEVIATED SEPTUM AND POLYPS  . permanent indwelling catheter    . PROSTATE SURGERY     PROSTATECTOMY  . TOTAL KNEE ARTHROPLASTY Left 9/15   Dr [name]  . URETHRAL STRICTURE DILATATION  02-2010   Dr.Cope    Allergies  Allergies  Allergen Reactions  . Ciprofloxacin Nausea And Vomiting    Headache  . Citalopram Hydrobromide Other (See Comments)    "unknown"  . Clindamycin Nausea And Vomiting  . Lorazepam     Adverse reaction  . Paroxetine Nausea Only  . Ramipril Other (See Comments)    "unknown"  . Simvastatin   . Sulfa Antibiotics Other (See Comments)    History of Present Illness    MARIUS BETTS is a 85 y.o. male who presents via audio/video conferencing for a telehealth visit today.  We attempted a video visit using his daughter's phone however, we were not able to connect and therefore settled for a  phone visit.  As above, he has a history of rheumatoid arthritis, TIA in 2019 on Plavix, hyperlipidemia, chronic hyponatremia due to suspected SIADH.  He was previously evaluated in 2010 with an echocardiogram that was unremarkable.  He apparently also had a stress test at some point which was low risk.  In August 2019, he was having abdominal discomfort and underwent CT of the abdomen and pelvis which incidentally showed a right renal mass.  There was some increase in size of the mass in December and he has been followed by urology with eventual plan for surgery.  This was supposed to have happened in March but this was delayed secondary to COVID-19 and now may not occur until mid summer depending on follow-up CT scan tomorrow.  He was seen by Dr. Fletcher Anon for preoperative evaluation in January, at which time he was doing well and was cleared for surgery.  Approximately 2 to 3 weeks ago, he began experiencing increasing lower extremity edema with weight gain from 190 pounds to a peak of 213 pounds.  He followed up with his nephrologist and his Lasix dose was increased from 20 mg daily to 40 mg daily.  With this, he has had approximately 19 pound weight loss and is down to 194 today.  He was seen by rheumatology earlier this week and he was noted to be tachycardic with an irregular heart rhythm and high suspicion for atrial fibrillation.  An echocardiogram was ordered and this was performed just yesterday, showing normal LV function with an RVSP of 45 mmHg.  The rhythm during the time of the study was atrial fibrillation at a rate of 119 bpm.  He denies palpitations, dyspnea, chest pain, presyncope, or syncope and has noted significant improvement in lower extremity edema with diuresis and weight loss.  He has not had any bleeding issues with Plavix.  The patient does not have symptoms concerning for COVID-19 infection (fever, chills, cough, or new shortness of breath).   Home Medications    Prior to  Admission medications   Medication Sig Start Date End Date Taking? Authorizing Provider  acetaminophen (TYLENOL) 500 MG tablet Take 1,000 mg by mouth every 6 (six) hours as needed.   Yes [provider]  albuterol (PROVENTIL HFA;VENTOLIN HFA) 108 (90 Base) MCG/ACT inhaler Inhale 2 puffs into the lungs every 6 (six) hours as needed for wheezing or shortness of breath. 02/25/19  Yes Chesley Mires, MD  ALPRAZolam Duanne Moron) 0.5 MG tablet TAKE 1 TABLET BY MOUTH THREE TIMES DAILY AS NEEDED FOR ANXIETY OR SLEEP 03/18/19  Yes [name], MD  Cholecalciferol (VITAMIN D3) 5000 UNITS CAPS Take 1 capsule by mouth daily.   Yes [provider]  Coenzyme Q10 (CO Q 10) 100 MG CAPS Take 1 capsule by mouth daily as needed.  Yes [provider]  Cranberry 425 MG CAPS Take 2 capsules by mouth daily.   Yes [provider]  famotidine (PEPCID) 20 MG tablet Take 1 tablet (20 mg total) by mouth daily. 11/15/18  Yes Baity, Coralie Keens, NP  fluticasone (FLONASE) 50 MCG/ACT nasal spray Place 2 sprays into both nostrils 2 (two) times daily. 04/27/17  Yes [name], MD  folic acid (FOLVITE) 1 MG tablet Take 1 tablet (1 mg total) by mouth daily. 04/27/17  Yes [name], MD  furosemide (LASIX) 20 MG tablet TAKE 1 TABLET BY MOUTH EVERY DAY Patient taking differently: Take 40 mg by mouth daily.  12/21/18  Yes [name], MD  gabapentin (NEURONTIN) 300 MG capsule TAKE 1 CAPSULE BY MOUTH THREE TIMES DAILY 12/21/18  Yes [name], MD  hydroxychloroquine (PLAQUENIL) 200 MG tablet Take 1 tablet by mouth 2 (two) times daily. 01/22/16  Yes [provider]  methotrexate (RHEUMATREX) 2.5 MG tablet Take 10 tablets by mouth once a week. 01/07/16  Yes [provider]  Milk Thistle 250 MG CAPS Take 1 capsule by mouth daily as needed.    Yes [provider]  Multiple Vitamin (MULTIVITAMIN) tablet Take 1 tablet by mouth daily.     Yes [provider]   MYRBETRIQ 50 MG TB24 tablet TAKE 1 TABLET(50 MG) BY MOUTH DAILY 01/24/19  Yes McGowan, Larene Beach A, PA-C  Omalizumab (XOLAIR [place]) Inject into the skin continuous.   Yes [provider]  polyethylene glycol (GLYCOLAX) packet Take 17 g by mouth at bedtime.    Yes [provider]  pravastatin (PRAVACHOL) 20 MG tablet TAKE 1 TABLET BY MOUTH EVERY DAY Patient taking differently: Take 20 mg by mouth at bedtime.  08/06/18  Yes [name], MD  Probiotic Product (ALIGN) 4 MG CAPS Take 1 capsule (4 mg total) by mouth daily. 12/15/16  Yes Gouru, Illene Silver, MD  Selenium 200 MCG CAPS Take 1 capsule by mouth daily as needed.    Yes [provider]  sodium chloride 1 g tablet Take 1 tablet (1 g total) by mouth 2 (two) times daily with a meal. Patient taking differently: Take 1 g by mouth 3 (three) times daily with meals.  10/31/18  Yes Gladstone Lighter, MD  Turmeric 500 MG CAPS Take 500 mg by mouth daily as needed.    Yes [provider]  vitamin B-12 (CYANOCOBALAMIN) 500 MCG tablet Take 500 mcg by mouth as needed.    Yes [provider]  apixaban (ELIQUIS) 5 MG TABS tablet Take 1 tablet (5 mg total) by mouth 2 (two) times daily. 03/28/19   Theora Gianotti, NP  metoprolol tartrate (LOPRESSOR) 25 MG tablet Take 1 tablet (25 mg total) by mouth 2 (two) times daily. 03/28/19 06/26/19  Theora Gianotti, NP    Review of Systems    As above, significant lower extremity edema and weight gain about 2 to 3 weeks ago which has improved significantly with diuresis.  He denies chest pain, palpitations, PND, dyspnea, orthopnea, dizziness, syncope, or early satiety.  All other systems reviewed and are otherwise negative except as noted above.  Physical Exam    Vital Signs:  BP 134/60 (BP Location: Left Arm, Patient Position: Sitting) Comment: last reading-not taken today  Ht 5\' 11"  (1.803 m)   Wt 194 lb (88 kg) Comment: taken a few days ago  BMI 27.06 kg/m  He  believes his heart rates were in the 90s to  100s earlier today.  No acute distress noted during phone call.  Respirations and speech were unlabored.  Accessory Clinical Findings    Echocardiogram performed yesterday reviewed in detail with patient.  Past medical history updated with this information.  I reviewed his ECG from January 31 of this year, when he was in sinus rhythm at a rate of 80 with a left axis, left anterior fascicular block, and right bundle branch block.  Lab Results  Component Value Date   CREATININE 0.57 (L) 02/04/2019   BUN 18 02/04/2019   NA 135 02/04/2019   K 4.0 02/04/2019   CL 100 02/04/2019   CO2 26 02/04/2019   Lab Results  Component Value Date   WBC 5.0 02/04/2019   HGB 11.1 (L) 02/04/2019   HCT 33.6 (L) 02/04/2019   MCV 97.1 02/04/2019   PLT 203 02/04/2019     Assessment & Plan    1.  Persistent atrial fibrillation: This is a new diagnosis for him and was officially noted on echocardiogram yesterday.  As this is a virtual visit, I do not have an ECG to review at this time.  Patient denies any history of palpitations but approximately 2 to 3 weeks ago started developing increasing lower extremity edema and weight gain which has since responded to diuresis.  CHA2DS2VASc is at least 6 and considering aortic atherosclerosis noted on prior CT, is more likely 7.  He is currently on Plavix in the setting of prior TIA last year and has not had any bleeding issues.  I am going to discontinue Plavix and initiate Eliquis 5 mg twice daily.  I am also adding low-dose metoprolol-25 mg twice daily.  Of note, he previously had low blood pressures requiring discontinuation of metoprolol.  Pressures, when checked, or more likely to be in the 130s recently.  He thinks he was on 50 mg of metoprolol in the past.  As duration of A. fib is unknown, I will plan on an initial attempt at rate control and management of heart failure symptoms with ongoing anticoagulation followed by  cardioversion in approximately 4 weeks.  He will need to be seen in the office in approximately 3 weeks with TSH and follow-up CBC and basic metabolic panel at that time.  2  Acute diastolic congestive heart failure: Likely driven by atrial fibrillation as echo showed normal LV function.  Diuretic adjustment per nephrology-currently tolerating 40 mg daily.  Creatinine was normal at 0.57 back in March.  Adding low-dose beta-blocker for heart rate management.  Blood pressure stable and we will have to watch this closely as he had previously been low.  Hopefully with restoration of sinus rhythm, volume management will become easier.  3.  Right renal mass: Pending follow-up CT tomorrow with tentative plans for elective surgery in early summer though this could change based on growth on CT tomorrow.  In the setting of new onset A. fib and anticoagulation, ideally we would like to get him cardioverted in about 4 weeks, following which he would need to be on uninterrupted anticoagulation for another 4 weeks prior to holding for surgery.  With elevated CHA2DS2VASc and prior history of TIA, he would benefit most from bridging with Lovenox prior to surgery.  4.  Disposition: Follow-up in office with Dr. Fletcher Anon with CBC, TSH, basic metabolic panel, and ECG, in 3 weeks.  COVID-19 Education: The signs and symptoms of COVID-19 were discussed with the patient and how to seek care for testing (follow up with PCP or  arrange E-visit).  The importance of social distancing was discussed today.  Patient Risk:   After full review of this patient's history and clinical status, I feel that he is at least moderate risk for cardiac complications at this time, thus necessitating a telehealth visit sooner than our first available in office visit.  Time:   Today, I have spent 45 minutes with the patient with telehealth technology discussing new onset atrial fibrillation and heart failure along with management including rate  control, anticoagulation, and eventual cardioversion.Murray Hodgkins, NP 03/28/2019, 4:03 PM

## 2019-03-28 NOTE — Telephone Encounter (Signed)
Virtual Visit Pre-Appointment Phone Call  "(Name), I am calling you today to discuss your upcoming appointment. We are currently trying to limit exposure to the virus that causes COVID-19 by seeing patients at home rather than in the office."  1. "What is the BEST phone number to call the day of the visit?" - include this in appointment notes  2. Do you have or have access to (through a family member/friend) a smartphone with video capability that we can use for your visit?" a. If yes - list this number in appt notes as cell (if different from BEST phone #) and list the appointment type as a VIDEO visit in appointment notes b. If no - list the appointment type as a PHONE visit in appointment notes  3. Confirm consent - "In the setting of the current Covid19 crisis, you are scheduled for a (phone or video) visit with your provider on (date) at (time).  Just as we do with many in-office visits, in order for you to participate in this visit, we must obtain consent.  If you'd like, I can send this to your mychart (if signed up) or email for you to review.  Otherwise, I can obtain your verbal consent now.  All virtual visits are billed to your insurance company just like a normal visit would be.  By agreeing to a virtual visit, we'd like you to understand that the technology does not allow for your provider to perform an examination, and thus may limit your provider's ability to fully assess your condition. If your provider identifies any concerns that need to be evaluated in person, we will make arrangements to do so.  Finally, though the technology is pretty good, we cannot assure that it will always work on either your or our end, and in the setting of a video visit, we may have to convert it to a phone-only visit.  In either situation, we cannot ensure that we have a secure connection.  Are you willing to proceed?" STAFF: Did the patient verbally acknowledge consent to telehealth visit? Document  YES/NO here: YES  4. Advise patient to be prepared - "Two hours prior to your appointment, go ahead and check your blood pressure, pulse, oxygen saturation, and your weight (if you have the equipment to check those) and write them all down. When your visit starts, your provider will ask you for this information. If you have an Apple Watch or Kardia device, please plan to have heart rate information ready on the day of your appointment. Please have a pen and paper handy nearby the day of the visit as well."  5. Give patient instructions for MyChart download to smartphone OR Doximity/Doxy.me as below if video visit (depending on what platform provider is using)  6. Inform patient they will receive a phone call 15 minutes prior to their appointment time (may be from unknown caller ID) so they should be prepared to answer    TELEPHONE CALL NOTE  SMITH POTENZA has been deemed a candidate for a follow-up tele-health visit to limit community exposure during the Covid-19 pandemic. I spoke with the patient via phone to ensure availability of phone/video source, confirm preferred email & phone number, and discuss instructions and expectations.  I reminded MAJOR SANTERRE to be prepared with any vital sign and/or heart rhythm information that could potentially be obtained via home monitoring, at the time of his visit. I reminded [name] to expect a phone call prior to  his visit.  Clarisse Gouge 03/28/2019 10:37 AM   INSTRUCTIONS FOR DOWNLOADING THE MYCHART APP TO SMARTPHONE  - The patient must first make sure to have activated MyChart and know their login information - If Apple, go to [employer] and type in MyChart in the search bar and download the app. If Android, ask patient to go to [employer] and type in [place] in the search bar and download the app. The app is free but as with any other app downloads, their phone may require them to verify saved payment information or Apple/Android  password.  - The patient will need to then log into the app with their MyChart username and password, and select  as their healthcare provider to link the account. When it is time for your visit, go to the MyChart app, find appointments, and click Begin Video Visit. Be sure to Select Allow for your device to access the Microphone and Camera for your visit. You will then be connected, and your provider will be with you shortly.  **If they have any issues connecting, or need assistance please contact MyChart service desk [phone])**  **If using a computer, in order to ensure the best quality for their visit they will need to use either of the following Internet Browsers: [employer], or Google Chrome**  IF USING DOXIMITY or DOXY.ME - The patient will receive a link just prior to their visit by text.     FULL LENGTH CONSENT FOR TELE-HEALTH VISIT   I hereby voluntarily request, consent and authorize [facility] and its employed or contracted physicians, physician assistants, nurse practitioners or other licensed health care professionals (the Practitioner), to provide me with telemedicine health care services (the Services") as deemed necessary by the treating Practitioner. I acknowledge and consent to receive the Services by the Practitioner via telemedicine. I understand that the telemedicine visit will involve communicating with the Practitioner through live audiovisual communication technology and the disclosure of certain medical information by electronic transmission. I acknowledge that I have been given the opportunity to request an in-person assessment or other available alternative prior to the telemedicine visit and am voluntarily participating in the telemedicine visit.  I understand that I have the right to withhold or withdraw my consent to the use of telemedicine in the course of my care at any time, without affecting my right to future care or treatment,  and that the Practitioner or I may terminate the telemedicine visit at any time. I understand that I have the right to inspect all information obtained and/or recorded in the course of the telemedicine visit and may receive copies of available information for a reasonable fee.  I understand that some of the potential risks of receiving the Services via telemedicine include:   Delay or interruption in medical evaluation due to technological equipment failure or disruption;  Information transmitted may not be sufficient (e.g. poor resolution of images) to allow for appropriate medical decision making by the Practitioner; and/or   In rare instances, security protocols could fail, causing a breach of personal health information.  Furthermore, I acknowledge that it is my responsibility to provide information about my medical history, conditions and care that is complete and accurate to the best of my ability. I acknowledge that Practitioner's advice, recommendations, and/or decision may be based on factors not within their control, such as incomplete or inaccurate data provided by me or distortions of diagnostic images or specimens that may result from electronic transmissions. I  understand that the practice of medicine is not an exact science and that Practitioner makes no warranties or guarantees regarding treatment outcomes. I acknowledge that I will receive a copy of this consent concurrently upon execution via email to the email address I last provided but may also request a printed copy by calling the office of [facility].    I understand that my insurance will be billed for this visit.   I have read or had this consent read to me.  I understand the contents of this consent, which adequately explains the benefits and risks of the Services being provided via telemedicine.   I have been provided ample opportunity to ask questions regarding this consent and the Services and have had my questions  answered to my satisfaction.  I give my informed consent for the services to be provided through the use of telemedicine in my medical care  By participating in this telemedicine visit I agree to the above.

## 2019-03-29 ENCOUNTER — Other Ambulatory Visit: Payer: Self-pay

## 2019-03-29 ENCOUNTER — Ambulatory Visit
Admission: RE | Admit: 2019-03-29 | Discharge: 2019-03-29 | Disposition: A | Discharge status: Home / Self Care | Payer: Medicare PPO | Source: Ambulatory Visit | Attending: Nephrology | Admitting: Nephrology

## 2019-03-29 DIAGNOSIS — N2889 Other specified disorders of kidney and ureter: Secondary | ICD-10-CM | POA: Insufficient documentation

## 2019-03-29 HISTORY — DX: Heart failure, unspecified: I50.9

## 2019-03-29 HISTORY — DX: Systemic involvement of connective tissue, unspecified: M35.9

## 2019-03-29 HISTORY — DX: Malignant neoplasm of prostate: C61

## 2019-03-29 LAB — POCT I-STAT CREATININE: Creatinine, Ser: 0.6 mg/dL — ABNORMAL LOW (ref 0.61–1.24)

## 2019-03-29 MED ORDER — IOHEXOL 350 MG/ML SOLN
100.0000 mL | Freq: Once | INTRAVENOUS | Status: AC | PRN
  Administered 2019-03-29: 11:00:00 100 mL via INTRAVENOUS

## 2019-04-03 ENCOUNTER — Ambulatory Visit (INDEPENDENT_AMBULATORY_CARE_PROVIDER_SITE_OTHER): Payer: Medicare PPO

## 2019-04-03 ENCOUNTER — Other Ambulatory Visit: Payer: Self-pay

## 2019-04-03 DIAGNOSIS — N319 Neuromuscular dysfunction of bladder, unspecified: Secondary | ICD-10-CM | POA: Diagnosis not present

## 2019-04-03 NOTE — Progress Notes (Signed)
Cath Change/ Replacement  Patient is present today for a catheter change due to urinary retention.  41ml of water was removed from the balloon, a 16FR foley cath was removed with out difficulty.  Patient was cleaned and prepped in a sterile fashion with betadine and 2% lidocaine jelly was instilled into the urethra. A 16 FR foley cath was replaced into the bladder no complications were noted Urine return was noted 90ml and urine was yellow in color. The balloon was filled with 13ml of sterile water. A leg bag was attached for drainage.  A night bag was also given to the patient and patient was given instruction on how to change from one bag to another. Patient was given proper instruction on catheter care.    Preformed by: Fonnie Jarvis, CMA  Follow up: 1 month

## 2019-04-08 ENCOUNTER — Ambulatory Visit (INDEPENDENT_AMBULATORY_CARE_PROVIDER_SITE_OTHER): Payer: Medicare PPO

## 2019-04-08 ENCOUNTER — Other Ambulatory Visit: Payer: Self-pay

## 2019-04-08 DIAGNOSIS — J4550 Severe persistent asthma, uncomplicated: Secondary | ICD-10-CM | POA: Diagnosis not present

## 2019-04-08 MED ORDER — OMALIZUMAB 150 MG/ML ~~LOC~~ SOSY
300.0000 mg | PREFILLED_SYRINGE | Freq: Once | SUBCUTANEOUS | Status: AC
  Administered 2019-04-08: 16:00:00 300 mg via SUBCUTANEOUS

## 2019-04-08 MED ORDER — OMALIZUMAB 150 MG ~~LOC~~ SOLR
375.0000 mg | Freq: Once | SUBCUTANEOUS | Status: AC
  Administered 2019-04-08: 17:00:00 375 mg via SUBCUTANEOUS

## 2019-04-08 MED ORDER — OMALIZUMAB 75 MG/0.5ML ~~LOC~~ SOSY: 75.0000 mg | PREFILLED_SYRINGE | Freq: Once | SUBCUTANEOUS | Status: DC

## 2019-04-08 NOTE — Patient Instructions (Addendum)
[name] came in on 04/08/2019 to receive a Xolair injection. The patient is given 375mg  every 15 days. Due to each vial equalling 150mg , 75mg  of medication was wasted. Deleted PFSs Made correction for SDVs. Correction: Added injections, The 60 that was deleted, I was thinking he had PFS but he didn't (SDVs) that's it was deleted.

## 2019-04-08 NOTE — Progress Notes (Signed)
Have you been hospitalized within the last 10 days?  No Do you have a fever?  No Do you have a cough?  No Do you have a headache or sore throat? No  

## 2019-04-10 ENCOUNTER — Telehealth: Payer: Self-pay | Admitting: Urology

## 2019-04-10 ENCOUNTER — Telehealth: Payer: Self-pay | Admitting: Cardiovascular Disease

## 2019-04-10 NOTE — Telephone Encounter (Signed)
-----   Message from Lillia Corporal sent at 04/10/2019  3:34 PM EDT ----- Regarding: Clearance PA called and stated this pt. Is going to be scheduled for a cardioversion following four weeks uninterrupted anticoagulation. She would like to speak to you about the upcoming Surgery and its urgency. Please advise?     Thank You, Denny Peon

## 2019-04-10 NOTE — Telephone Encounter (Signed)
In light of recent cardiac issues, we will need to hold off on surgery.    He should undergo cardioversion and anticoagulation, have him scheduled for virtual visit 4 weeks after cardioversion to reassess timing of surgery.  Hollice Espy, MD

## 2019-04-10 NOTE — Telephone Encounter (Signed)
° °  [facility] Pre-operative Risk Assessment    Request for surgical clearance:  1. What type of surgery is being performed? R hand assist radical nephrectomy   2. When is this surgery scheduled? 05/06/19  3. What type of clearance is required (medical clearance vs. Pharmacy clearance to hold med vs. Both)?  BOTH   4. Are there any medications that need to be held prior to surgery and how long? Eliquis x 3 days prior   5. Practice name and name of physician performing surgery?  [facility]   6. What is your office phone number [phone]   7.   What is your office fax number  [phone]  8.   Anesthesia type (None, local, MAC, general) ? Not noted    [name] 04/10/2019, 11:24 AM  _________________________________________________________________   (provider comments below)

## 2019-04-10 NOTE — Telephone Encounter (Signed)
I have reached out to the surgeon's office.  Patient was recently seen for new onset atrial fibrillation.  Plan for now is to anticoagulate and rate control with plans for cardioversion after 4 weeks of anticoagulation.  Following cardioversion, the patient will need an additional 4 weeks of anticoagulation uninterrupted.  I have contacted the urologist office if this nephrectomy is urgent or can be deferred until after uninterrupted anticoagulation is completed.  Awaiting callback.

## 2019-04-12 NOTE — Telephone Encounter (Signed)
Mr. Sardo has an appointment with Dr. Fletcher Anon on 04/16/2019 to address surgical clearance.   I will route this to the requesting provider and also to Dr. Fletcher Anon so that he can address.   Daune Perch, AGNP-C [facility] 04/12/2019  9:36 AM

## 2019-04-16 ENCOUNTER — Ambulatory Visit: Payer: Medicare PPO | Admitting: Cardiovascular Disease

## 2019-04-16 ENCOUNTER — Other Ambulatory Visit: Payer: Self-pay

## 2019-04-16 ENCOUNTER — Ambulatory Visit (INDEPENDENT_AMBULATORY_CARE_PROVIDER_SITE_OTHER): Payer: Medicare PPO | Admitting: Cardiovascular Disease

## 2019-04-16 VITALS — BP 100/52 | HR 98

## 2019-04-16 DIAGNOSIS — I4819 Other persistent atrial fibrillation: Secondary | ICD-10-CM | POA: Diagnosis not present

## 2019-04-16 DIAGNOSIS — I5032 Chronic diastolic (congestive) heart failure: Secondary | ICD-10-CM

## 2019-04-16 MED ORDER — FUROSEMIDE 20 MG PO TABS: 20.0000 mg | ORAL_TABLET | Freq: Every day | ORAL | Status: DC

## 2019-04-16 NOTE — Patient Instructions (Addendum)
Medication Instructions:  Your physician has recommended you make the following change in your medication:   REDUCE Lasix to 20mg  daily  If you need a refill on your cardiac medications before your next appointment, please call your pharmacy.   Lab work: None ordered If you have labs (blood work) drawn today and your tests are completely normal, you will receive your results only by: Marland Kitchen MyChart Message (if you have MyChart) OR . A paper copy in the mail If you have any lab test that is abnormal or we need to change your treatment, we will call you to review the results.  Testing/Procedures: None ordered  Follow-Up: At [facility], you and your health needs are our priority.  As part of our continuing mission to provide you with exceptional heart care, we have created designated Provider Care Teams.  These Care Teams include your primary Cardiologist (physician) and Advanced Practice Providers (APPs -  Physician Assistants and Nurse Practitioners) who all work together to provide you with the care you need, when you need it. You will need a follow up appointment pending disucssion with Dr.Brandon   You may see Kathlyn Sacramento, MD or one of the following Advanced Practice Providers on your designated Care Team:   Murray Hodgkins, NP Christell Faith, PA-C . Marrianne Mood, PA-C  Any Other Special Instructions Will Be Listed Below (If Applicable). We will contact you about proceeded with the cardioversion after discussion with your urologist Dr.Brandon

## 2019-04-16 NOTE — H&P (View-Only) (Signed)
Cardiology Office Note   Date:  04/16/2019   ID:  [name], DOB Oct 28, 1934, MRN [id number]  PCP:  [name], MD  Cardiologist:   Kathlyn Sacramento, MD   No chief complaint on file.     History of Present Illness: [name] is a 85 y.o. male who presents for a follow-up visit regarding recently diagnosed atrial fibrillation and preop cardiovascular evaluation before nephrectomy.   The patient has chronic medical conditions that include rheumatoid arthritis, history of TIA in 2019 on Plavix, hyperlipidemia, chronic hyponatremia due to suspected SIADH and osteoarthritis. He had an echocardiogram done in June 2019 which showed normal LV systolic function with very mild aortic stenosis and mild mitral regurgitation. He has been followed by urology for right renal mass that was diagnosed last year but has increased in size.  He was supposed to get nephrectomy done in March but it was postponed due to COVID-19. The patient started having worsening leg edema and weight gain in April.  He was referred for an echocardiogram which showed normal LV systolic function with mild pulmonary hypertension.  He was noted to be in atrial fibrillation.  He was switched from Plavix to Eliquis 5 mg twice daily and was also started on metoprolol 25 mg twice daily.  Anticoagulation was initiated on April 30.  He was thought to have diastolic heart failure related to atrial fibrillation.  The dose of furosemide was increased to 40 mg once daily by Dr. Holley Raring.  He has been on this dose for 1 month now and his weight decreased from 213 pounds to 179 pounds with resolution of leg edema.  He denies chest pain.  He has stable exertional dyspnea.  He has been taking anticoagulation without interruption.  He does complain of low blood pressure since he was started on metoprolol.  He had issues with hypotension in the past while he was on metoprolol.   Past Medical History:  Diagnosis Date  . (HFpEF) heart  failure with preserved ejection fraction ([place])    a. 02/2019 Echo: Ef 60-65%, mildly reduced RV fxn. RVSP 89mmHg. Mildly dil LA. Mod dil RA. Mild to mod TR. Mild to mod AS. Triv AI.  Marland Kitchen Allergy   . Anemia   . Anxiety   . Arthritis    rheumatoid  . Asthma   . Basal cell carcinoma    back  . CAD (coronary artery disease)   . CHF (congestive heart failure) ([place])   . Chronic kidney disease    Mass right kidney  . Chronic sinusitis   . Collagen vascular disease (HCC)    Rheumatoid Arthritis  . Depression   . Diverticulitis   . ED (erectile dysfunction)   . GERD (gastroesophageal reflux disease)   . History of SIADH   . Hx of adenomatous colonic polyps   . Hypertension   . Hyponatremia   . IBS (irritable bowel syndrome)   . Neurodermatitis   . Neurogenic bladder   . OSA (obstructive sleep apnea)   . Persistent atrial fibrillation    a. Dx 02/2019; b. CHA2DS2VASc = 6-->eliquis initiated.  . Prostate cancer ([place])   . Renal mass    10/2018 4.2cm R renal cortical mass. Most compatible w/ clear cell renal cell carcinoma.    Past Surgical History:  Procedure Laterality Date  . KNEE ARTHROPLASTY Right 09/02/2015   Procedure: COMPUTER ASSISTED TOTAL KNEE ARTHROPLASTY;  Surgeon: [name], MD;  Location: [facility] ORS;  Service: Orthopedics;  Laterality: Right;  . NASAL SINUS SURGERY  2009   DEVIATED SEPTUM AND POLYPS  . permanent indwelling catheter    . PROSTATE SURGERY     PROSTATECTOMY  . TOTAL KNEE ARTHROPLASTY Left 9/15   Dr [name]  . URETHRAL STRICTURE DILATATION  02-2010   Dr.Cope     Current Outpatient Medications  Medication Sig Dispense Refill  . acetaminophen (TYLENOL) 500 MG tablet Take 1,000 mg by mouth every 6 (six) hours as needed.    Marland Kitchen albuterol (PROVENTIL HFA;VENTOLIN HFA) 108 (90 Base) MCG/ACT inhaler Inhale 2 puffs into the lungs every 6 (six) hours as needed for wheezing or shortness of breath. 1 Inhaler 3  . ALPRAZolam (XANAX) 0.5 MG tablet TAKE 1 TABLET BY  MOUTH THREE TIMES DAILY AS NEEDED FOR ANXIETY OR SLEEP 90 tablet 0  . apixaban (ELIQUIS) 5 MG TABS tablet Take 1 tablet (5 mg total) by mouth 2 (two) times daily. 60 tablet 6  . Cholecalciferol (VITAMIN D3) 5000 UNITS CAPS Take 1 capsule by mouth daily.    . Coenzyme Q10 (CO Q 10) 100 MG CAPS Take 1 capsule by mouth daily as needed.     . Cranberry 425 MG CAPS Take 2 capsules by mouth daily.    . famotidine (PEPCID) 20 MG tablet Take 1 tablet (20 mg total) by mouth daily. 30 tablet 2  . fluticasone (FLONASE) 50 MCG/ACT nasal spray Place 2 sprays into both nostrils 2 (two) times daily. 48 g 3  . folic acid (FOLVITE) 1 MG tablet Take 1 tablet (1 mg total) by mouth daily. 90 tablet 3  . furosemide (LASIX) 20 MG tablet Take 1 tablet (20 mg total) by mouth daily.    Marland Kitchen gabapentin (NEURONTIN) 300 MG capsule TAKE 1 CAPSULE BY MOUTH THREE TIMES DAILY 90 capsule 11  . hydroxychloroquine (PLAQUENIL) 200 MG tablet Take 1 tablet by mouth 2 (two) times daily.    . methotrexate (RHEUMATREX) 2.5 MG tablet Take 10 tablets by mouth once a week.    . metoprolol tartrate (LOPRESSOR) 25 MG tablet Take 1 tablet (25 mg total) by mouth 2 (two) times daily. 180 tablet 3  . Milk Thistle 250 MG CAPS Take 1 capsule by mouth daily as needed.     . Multiple Vitamin (MULTIVITAMIN) tablet Take 1 tablet by mouth daily.      Marland Kitchen MYRBETRIQ 50 MG TB24 tablet TAKE 1 TABLET(50 MG) BY MOUTH DAILY 30 tablet 3  . Omalizumab (XOLAIR [place]) Inject into the skin continuous.    . polyethylene glycol (GLYCOLAX) packet Take 17 g by mouth at bedtime.     . pravastatin (PRAVACHOL) 20 MG tablet TAKE 1 TABLET BY MOUTH EVERY DAY (Patient taking differently: Take 20 mg by mouth at bedtime. ) 90 tablet 3  . Probiotic Product (ALIGN) 4 MG CAPS Take 1 capsule (4 mg total) by mouth daily. 15 capsule 0  . Selenium 200 MCG CAPS Take 1 capsule by mouth daily as needed.     . sodium chloride 1 g tablet Take 1 tablet (1 g total) by mouth 2 (two) times daily  with a meal. (Patient taking differently: Take 1 g by mouth 3 (three) times daily with meals. ) 60 tablet 1  . Turmeric 500 MG CAPS Take 500 mg by mouth daily as needed.     . vitamin B-12 (CYANOCOBALAMIN) 500 MCG tablet Take 500 mcg by mouth as needed.      Current Facility-Administered Medications  Medication Dose Route Frequency Provider  Last Rate Last Dose  . omalizumab Arvid Right) prefilled syringe 300 mg  300 mg Subcutaneous Once Chesley Mires, MD      . omalizumab Arvid Right) prefilled syringe 75 mg  75 mg Subcutaneous Once Chesley Mires, MD        Allergies:   Ciprofloxacin; Citalopram hydrobromide; Clindamycin; Lorazepam; Paroxetine; Ramipril; Simvastatin; and Sulfa antibiotics    Social History:  The patient  reports that he has never smoked. He has never used smokeless tobacco. He reports current alcohol use. He reports that he does not use drugs.   Family History:  The patient's family history includes Heart disease (age of onset: 78) in his mother; Pneumonia (age of onset: 22) in his father; Skin cancer in his father, sister, and son.    ROS:  Please see the history of present illness.   Otherwise, review of systems are positive for none.   All other systems are reviewed and negative.    PHYSICAL EXAM: VS:  BP (!) 100/52   Pulse 98  , BMI There is no height or weight on file to calculate BMI. GEN: Well nourished, well developed, in no acute distress  HEENT: normal  Neck: no JVD, carotid bruits, or masses Cardiac: Irregularly irregular; no murmurs, rubs, or gallops,no edema  Respiratory:  clear to auscultation bilaterally, normal work of breathing GI: soft, nontender, nondistended, + BS MS: no deformity or atrophy  Skin: warm and dry, no rash Neuro:  Strength and sensation are intact Psych: euthymic mood, full affect   EKG:  EKG is ordered today. The ekg ordered today demonstrates atrial fibrillation with PACs, right bundle branch block and left anterior fascicular block.   Normal sinus rhythm with right bundle branch block and left anterior fascicular block.   Recent Labs: 10/28/2018: ALT 15 02/04/2019: BUN 18; Hemoglobin 11.1; Platelets 203; Potassium 4.0; Sodium 135 03/29/2019: Creatinine, Ser 0.60    Lipid Panel    Component Value Date/Time   CHOL 114 05/26/2018 0411   TRIG 49 05/26/2018 0411   HDL 43 05/26/2018 0411   CHOLHDL 2.7 05/26/2018 0411   VLDL 10 05/26/2018 0411   LDLCALC 61 05/26/2018 0411      Wt Readings from Last 3 Encounters:  03/28/19 194 lb (88 kg)  01/02/19 193 lb 12.8 oz (87.9 kg)  12/28/18 191 lb (86.6 kg)       PAD Screen 12/28/2018  Previous PAD dx? No  Previous surgical procedure? (No Data)  Pain with walking? Yes  Subsides with rest? Yes  Feet/toe relief with dangling? No  Painful, non-healing ulcers? No  Extremities discolored? Yes      ASSESSMENT AND PLAN:  1.  Persistent atrial fibrillation: Ventricular rate is reasonably controlled on current dose of metoprolol.  He has been on anticoagulation since April 30 with no interruption.  Given that his atrial fibrillation was complicated by diastolic heart failure, I think it is worth trying to get him back in sinus rhythm with cardioversion.  This can be done next week.  However, he cannot stop anticoagulation for a minimal of 1 month after that. I discussed the case with his urologist, Dr. Erlene Quan who does not feel that nephrectomy is urgent at the present time and we can proceed with cardioversion as planned.  I discussed the cardioversion procedure in details with the patient and he is agreeable to proceed.  2.  Chronic diastolic heart failure: The patient lost significant amount of weight and he might be volume depleted.  I decreased furosemide to  20 mg daily.  3.  Preop cardiovascular evaluation for nephrectomy: He has no anginal symptoms.  Recent echo showed normal LV systolic function.  No need for ischemic cardiac evaluation.   Disposition:   FU in 3  months.  Signed,  Kathlyn Sacramento, MD  04/16/2019 4:18 PM    [facility]

## 2019-04-16 NOTE — Progress Notes (Signed)
Cardiology Office Note   Date:  04/16/2019   ID:  [name], DOB [birth year], MRN [id number]  PCP:  [name], MD  Cardiologist:   Kathlyn Sacramento, MD   No chief complaint on file.     History of Present Illness: [name] is a 85 y.o. male who presents for a follow-up visit regarding recently diagnosed atrial fibrillation and preop cardiovascular evaluation before nephrectomy.   The patient has chronic medical conditions that include rheumatoid arthritis, history of TIA in 2019 on Plavix, hyperlipidemia, chronic hyponatremia due to suspected SIADH and osteoarthritis. He had an echocardiogram done in June 2019 which showed normal LV systolic function with very mild aortic stenosis and mild mitral regurgitation. He has been followed by urology for right renal mass that was diagnosed last year but has increased in size.  He was supposed to get nephrectomy done in March but it was postponed due to COVID-19. The patient started having worsening leg edema and weight gain in April.  He was referred for an echocardiogram which showed normal LV systolic function with mild pulmonary hypertension.  He was noted to be in atrial fibrillation.  He was switched from Plavix to Eliquis 5 mg twice daily and was also started on metoprolol 25 mg twice daily.  Anticoagulation was initiated on April 30.  He was thought to have diastolic heart failure related to atrial fibrillation.  The dose of furosemide was increased to 40 mg once daily by Dr. Holley Raring.  He has been on this dose for 1 month now and his weight decreased from 213 pounds to 179 pounds with resolution of leg edema.  He denies chest pain.  He has stable exertional dyspnea.  He has been taking anticoagulation without interruption.  He does complain of low blood pressure since he was started on metoprolol.  He had issues with hypotension in the past while he was on metoprolol.   Past Medical History:  Diagnosis Date  . (HFpEF) heart  failure with preserved ejection fraction ([place])    a. 02/2019 Echo: Ef 60-65%, mildly reduced RV fxn. RVSP 81mmHg. Mildly dil LA. Mod dil RA. Mild to mod TR. Mild to mod AS. Triv AI.  Marland Kitchen Allergy   . Anemia   . Anxiety   . Arthritis    rheumatoid  . Asthma   . Basal cell carcinoma    back  . CAD (coronary artery disease)   . CHF (congestive heart failure) ([place])   . Chronic kidney disease    Mass right kidney  . Chronic sinusitis   . Collagen vascular disease (HCC)    Rheumatoid Arthritis  . Depression   . Diverticulitis   . ED (erectile dysfunction)   . GERD (gastroesophageal reflux disease)   . History of SIADH   . Hx of adenomatous colonic polyps   . Hypertension   . Hyponatremia   . IBS (irritable bowel syndrome)   . Neurodermatitis   . Neurogenic bladder   . OSA (obstructive sleep apnea)   . Persistent atrial fibrillation    a. Dx 02/2019; b. CHA2DS2VASc = 6-->eliquis initiated.  . Prostate cancer ([place])   . Renal mass    10/2018 4.2cm R renal cortical mass. Most compatible w/ clear cell renal cell carcinoma.    Past Surgical History:  Procedure Laterality Date  . KNEE ARTHROPLASTY Right 09/02/2015   Procedure: COMPUTER ASSISTED TOTAL KNEE ARTHROPLASTY;  Surgeon: [name], MD;  Location: [facility] ORS;  Service: Orthopedics;  Laterality: Right;  . NASAL SINUS SURGERY  2009   DEVIATED SEPTUM AND POLYPS  . permanent indwelling catheter    . PROSTATE SURGERY     PROSTATECTOMY  . TOTAL KNEE ARTHROPLASTY Left 9/15   Dr [name]  . URETHRAL STRICTURE DILATATION  02-2010   Dr.Cope     Current Outpatient Medications  Medication Sig Dispense Refill  . acetaminophen (TYLENOL) 500 MG tablet Take 1,000 mg by mouth every 6 (six) hours as needed.    Marland Kitchen albuterol (PROVENTIL HFA;VENTOLIN HFA) 108 (90 Base) MCG/ACT inhaler Inhale 2 puffs into the lungs every 6 (six) hours as needed for wheezing or shortness of breath. 1 Inhaler 3  . ALPRAZolam (XANAX) 0.5 MG tablet TAKE 1 TABLET BY  MOUTH THREE TIMES DAILY AS NEEDED FOR ANXIETY OR SLEEP 90 tablet 0  . apixaban (ELIQUIS) 5 MG TABS tablet Take 1 tablet (5 mg total) by mouth 2 (two) times daily. 60 tablet 6  . Cholecalciferol (VITAMIN D3) 5000 UNITS CAPS Take 1 capsule by mouth daily.    . Coenzyme Q10 (CO Q 10) 100 MG CAPS Take 1 capsule by mouth daily as needed.     . Cranberry 425 MG CAPS Take 2 capsules by mouth daily.    . famotidine (PEPCID) 20 MG tablet Take 1 tablet (20 mg total) by mouth daily. 30 tablet 2  . fluticasone (FLONASE) 50 MCG/ACT nasal spray Place 2 sprays into both nostrils 2 (two) times daily. 48 g 3  . folic acid (FOLVITE) 1 MG tablet Take 1 tablet (1 mg total) by mouth daily. 90 tablet 3  . furosemide (LASIX) 20 MG tablet Take 1 tablet (20 mg total) by mouth daily.    Marland Kitchen gabapentin (NEURONTIN) 300 MG capsule TAKE 1 CAPSULE BY MOUTH THREE TIMES DAILY 90 capsule 11  . hydroxychloroquine (PLAQUENIL) 200 MG tablet Take 1 tablet by mouth 2 (two) times daily.    . methotrexate (RHEUMATREX) 2.5 MG tablet Take 10 tablets by mouth once a week.    . metoprolol tartrate (LOPRESSOR) 25 MG tablet Take 1 tablet (25 mg total) by mouth 2 (two) times daily. 180 tablet 3  . Milk Thistle 250 MG CAPS Take 1 capsule by mouth daily as needed.     . Multiple Vitamin (MULTIVITAMIN) tablet Take 1 tablet by mouth daily.      Marland Kitchen MYRBETRIQ 50 MG TB24 tablet TAKE 1 TABLET(50 MG) BY MOUTH DAILY 30 tablet 3  . Omalizumab (XOLAIR [place]) Inject into the skin continuous.    . polyethylene glycol (GLYCOLAX) packet Take 17 g by mouth at bedtime.     . pravastatin (PRAVACHOL) 20 MG tablet TAKE 1 TABLET BY MOUTH EVERY DAY (Patient taking differently: Take 20 mg by mouth at bedtime. ) 90 tablet 3  . Probiotic Product (ALIGN) 4 MG CAPS Take 1 capsule (4 mg total) by mouth daily. 15 capsule 0  . Selenium 200 MCG CAPS Take 1 capsule by mouth daily as needed.     . sodium chloride 1 g tablet Take 1 tablet (1 g total) by mouth 2 (two) times daily  with a meal. (Patient taking differently: Take 1 g by mouth 3 (three) times daily with meals. ) 60 tablet 1  . Turmeric 500 MG CAPS Take 500 mg by mouth daily as needed.     . vitamin B-12 (CYANOCOBALAMIN) 500 MCG tablet Take 500 mcg by mouth as needed.      Current Facility-Administered Medications  Medication Dose Route Frequency Provider  Last Rate Last Dose  . omalizumab Arvid Right) prefilled syringe 300 mg  300 mg Subcutaneous Once Chesley Mires, MD      . omalizumab Arvid Right) prefilled syringe 75 mg  75 mg Subcutaneous Once Chesley Mires, MD        Allergies:   Ciprofloxacin; Citalopram hydrobromide; Clindamycin; Lorazepam; Paroxetine; Ramipril; Simvastatin; and Sulfa antibiotics    Social History:  The patient  reports that he has never smoked. He has never used smokeless tobacco. He reports current alcohol use. He reports that he does not use drugs.   Family History:  The patient's family history includes Heart disease (age of onset: 14) in his mother; Pneumonia (age of onset: 53) in his father; Skin cancer in his father, sister, and son.    ROS:  Please see the history of present illness.   Otherwise, review of systems are positive for none.   All other systems are reviewed and negative.    PHYSICAL EXAM: VS:  BP (!) 100/52   Pulse 98  , BMI There is no height or weight on file to calculate BMI. GEN: Well nourished, well developed, in no acute distress  HEENT: normal  Neck: no JVD, carotid bruits, or masses Cardiac: Irregularly irregular; no murmurs, rubs, or gallops,no edema  Respiratory:  clear to auscultation bilaterally, normal work of breathing GI: soft, nontender, nondistended, + BS MS: no deformity or atrophy  Skin: warm and dry, no rash Neuro:  Strength and sensation are intact Psych: euthymic mood, full affect   EKG:  EKG is ordered today. The ekg ordered today demonstrates atrial fibrillation with PACs, right bundle branch block and left anterior fascicular block.   Normal sinus rhythm with right bundle branch block and left anterior fascicular block.   Recent Labs: 10/28/2018: ALT 15 02/04/2019: BUN 18; Hemoglobin 11.1; Platelets 203; Potassium 4.0; Sodium 135 03/29/2019: Creatinine, Ser 0.60    Lipid Panel    Component Value Date/Time   CHOL 114 05/26/2018 0411   TRIG 49 05/26/2018 0411   HDL 43 05/26/2018 0411   CHOLHDL 2.7 05/26/2018 0411   VLDL 10 05/26/2018 0411   LDLCALC 61 05/26/2018 0411      Wt Readings from Last 3 Encounters:  03/28/19 194 lb (88 kg)  01/02/19 193 lb 12.8 oz (87.9 kg)  12/28/18 191 lb (86.6 kg)       PAD Screen 12/28/2018  Previous PAD dx? No  Previous surgical procedure? (No Data)  Pain with walking? Yes  Subsides with rest? Yes  Feet/toe relief with dangling? No  Painful, non-healing ulcers? No  Extremities discolored? Yes      ASSESSMENT AND PLAN:  1.  Persistent atrial fibrillation: Ventricular rate is reasonably controlled on current dose of metoprolol.  He has been on anticoagulation since April 30 with no interruption.  Given that his atrial fibrillation was complicated by diastolic heart failure, I think it is worth trying to get him back in sinus rhythm with cardioversion.  This can be done next week.  However, he cannot stop anticoagulation for a minimal of 1 month after that. I discussed the case with his urologist, Dr. Erlene Quan who does not feel that nephrectomy is urgent at the present time and we can proceed with cardioversion as planned.  I discussed the cardioversion procedure in details with the patient and he is agreeable to proceed.  2.  Chronic diastolic heart failure: The patient lost significant amount of weight and he might be volume depleted.  I decreased furosemide to  20 mg daily.  3.  Preop cardiovascular evaluation for nephrectomy: He has no anginal symptoms.  Recent echo showed normal LV systolic function.  No need for ischemic cardiac evaluation.   Disposition:   FU in 3  months.  Signed,  Kathlyn Sacramento, MD  04/16/2019 4:18 PM    [facility]

## 2019-04-18 ENCOUNTER — Telehealth: Payer: Self-pay | Admitting: *Deleted

## 2019-04-18 ENCOUNTER — Encounter: Payer: Self-pay | Admitting: *Deleted

## 2019-04-18 DIAGNOSIS — Z01818 Encounter for other preprocedural examination: Secondary | ICD-10-CM

## 2019-04-18 DIAGNOSIS — I4819 Other persistent atrial fibrillation: Secondary | ICD-10-CM

## 2019-04-18 NOTE — Telephone Encounter (Signed)
The patient and the patient's daughter have been called with instructions for the cardioversion scheduled for 04/23/2019 with Dr. Saunders Revel. The cardioversion is scheduled for 7:30 am with the patient arriving to the medical mall by 6:30 am. He has been advised to wear a mask.   The patient has been on Eliquis 5mg  bid and has been advised to not stop the medication. He will hold the furosemide the morning of the procedure for his comfort.  He will proceed to have his pre procedure labs done tomorrow prior to getting the coronavirus test completed. One the lab work has been completed he will proceed to the [facility] to have the corona test completed. He has been advised that he will need to stay quarantined after the test.  All instructions have been sent to [place] [place].       COVID-19 Pre-Screening Questions:  . In the past 7 to 10 days have you had a cough,  shortness of breath, headache, congestion, fever (100 or greater) body aches, chills, sore throat, or sudden loss of taste or sense of smell? No . Have you been around anyone with known Covid 19. No . Have you been around anyone who is awaiting Covid 19 test results in the past 7 to 10 days? No . Have you been around anyone who has been exposed to Covid 19, or has mentioned symptoms of Covid 19 within the past 7 to 10 days? No  If you have any concerns/questions about symptoms patients report during screening (either on the phone or at threshold). Contact the provider seeing the patient or DOD for further guidance.  If neither are available contact a member of the leadership team.

## 2019-04-19 ENCOUNTER — Other Ambulatory Visit
Admission: RE | Admit: 2019-04-19 | Discharge: 2019-04-19 | Disposition: A | Discharge status: Home / Self Care | Payer: Medicare PPO | Source: Ambulatory Visit | Attending: Cardiovascular Disease | Admitting: Cardiovascular Disease

## 2019-04-19 ENCOUNTER — Other Ambulatory Visit: Payer: Self-pay

## 2019-04-19 DIAGNOSIS — Z1159 Encounter for screening for other viral diseases: Secondary | ICD-10-CM | POA: Diagnosis not present

## 2019-04-19 DIAGNOSIS — Z01812 Encounter for preprocedural laboratory examination: Secondary | ICD-10-CM | POA: Diagnosis not present

## 2019-04-19 DIAGNOSIS — I4819 Other persistent atrial fibrillation: Secondary | ICD-10-CM | POA: Diagnosis not present

## 2019-04-19 LAB — TSH: TSH: 3.159 u[IU]/mL (ref 0.350–4.500)

## 2019-04-19 LAB — BASIC METABOLIC PANEL
Anion gap: 5 (ref 5–15)
BUN: 19 mg/dL (ref 8–23)
CO2: 28 mmol/L (ref 22–32)
Calcium: 8.4 mg/dL — ABNORMAL LOW (ref 8.9–10.3)
Chloride: 105 mmol/L (ref 98–111)
Creatinine, Ser: 0.55 mg/dL — ABNORMAL LOW (ref 0.61–1.24)
GFR calc Af Amer: >60 mL/min (ref >60)
GFR calc non Af Amer: >60 mL/min (ref >60)
Glucose, Bld: 103 mg/dL — ABNORMAL HIGH (ref 70–99)
Potassium: 3.8 mmol/L (ref 3.5–5.1)
Sodium: 138 mmol/L (ref 135–145)

## 2019-04-19 LAB — CBC
HCT: 32.6 % — ABNORMAL LOW (ref 39.0–52.0)
Hemoglobin: 10.1 g/dL — ABNORMAL LOW (ref 13.0–17.0)
MCH: 30.3 pg (ref 26.0–34.0)
MCHC: 31 g/dL (ref 30.0–36.0)
MCV: 97.9 fL (ref 80.0–100.0)
Platelets: 171 10*3/uL (ref 150–400)
RBC: 3.33 MIL/uL — ABNORMAL LOW (ref 4.22–5.81)
RDW: 16 % — ABNORMAL HIGH (ref 11.5–15.5)
WBC: 5.5 10*3/uL (ref 4.0–10.5)
nRBC: 0 % (ref 0.0–0.2)

## 2019-04-19 NOTE — Addendum Note (Signed)
Addended by: Kathlyn Sacramento A on: 04/19/2019 03:55 PM   Modules accepted: Orders, SmartSet

## 2019-04-20 LAB — NOVEL CORONAVIRUS, NAA (HOSP ORDER, SEND-OUT TO REF LAB; TAT 18-24 HRS): SARS-CoV-2, NAA: NOT DETECTED

## 2019-04-23 ENCOUNTER — Encounter
Admission: RE | Disposition: A | Discharge status: Home / Self Care | Payer: Self-pay | Source: Home / Self Care | Attending: Internal Medicine

## 2019-04-23 ENCOUNTER — Ambulatory Visit
Admission: RE | Admit: 2019-04-23 | Discharge: 2019-04-23 | Disposition: A | Discharge status: Home / Self Care | Payer: Medicare PPO | Attending: Internal Medicine | Admitting: Internal Medicine

## 2019-04-23 ENCOUNTER — Ambulatory Visit: Payer: Medicare PPO | Admitting: Registered Nurse

## 2019-04-23 ENCOUNTER — Other Ambulatory Visit: Payer: Self-pay

## 2019-04-23 DIAGNOSIS — Z8673 Personal history of transient ischemic attack (TIA), and cerebral infarction without residual deficits: Secondary | ICD-10-CM | POA: Insufficient documentation

## 2019-04-23 DIAGNOSIS — K219 Gastro-esophageal reflux disease without esophagitis: Secondary | ICD-10-CM | POA: Diagnosis not present

## 2019-04-23 DIAGNOSIS — E785 Hyperlipidemia, unspecified: Secondary | ICD-10-CM | POA: Diagnosis not present

## 2019-04-23 DIAGNOSIS — G4733 Obstructive sleep apnea (adult) (pediatric): Secondary | ICD-10-CM | POA: Diagnosis not present

## 2019-04-23 DIAGNOSIS — M1990 Unspecified osteoarthritis, unspecified site: Secondary | ICD-10-CM | POA: Insufficient documentation

## 2019-04-23 DIAGNOSIS — I130 Hypertensive heart and chronic kidney disease with heart failure and stage 1 through stage 4 chronic kidney disease, or unspecified chronic kidney disease: Secondary | ICD-10-CM | POA: Diagnosis not present

## 2019-04-23 DIAGNOSIS — I272 Pulmonary hypertension, unspecified: Secondary | ICD-10-CM | POA: Diagnosis not present

## 2019-04-23 DIAGNOSIS — Z7901 Long term (current) use of anticoagulants: Secondary | ICD-10-CM | POA: Insufficient documentation

## 2019-04-23 DIAGNOSIS — I4891 Unspecified atrial fibrillation: Secondary | ICD-10-CM | POA: Diagnosis not present

## 2019-04-23 DIAGNOSIS — N189 Chronic kidney disease, unspecified: Secondary | ICD-10-CM | POA: Insufficient documentation

## 2019-04-23 DIAGNOSIS — K589 Irritable bowel syndrome without diarrhea: Secondary | ICD-10-CM | POA: Diagnosis not present

## 2019-04-23 DIAGNOSIS — I2510 Atherosclerotic heart disease of native coronary artery without angina pectoris: Secondary | ICD-10-CM | POA: Diagnosis not present

## 2019-04-23 DIAGNOSIS — I5042 Chronic combined systolic (congestive) and diastolic (congestive) heart failure: Secondary | ICD-10-CM | POA: Insufficient documentation

## 2019-04-23 DIAGNOSIS — M069 Rheumatoid arthritis, unspecified: Secondary | ICD-10-CM | POA: Insufficient documentation

## 2019-04-23 DIAGNOSIS — Z79899 Other long term (current) drug therapy: Secondary | ICD-10-CM | POA: Diagnosis not present

## 2019-04-23 DIAGNOSIS — F419 Anxiety disorder, unspecified: Secondary | ICD-10-CM | POA: Diagnosis not present

## 2019-04-23 DIAGNOSIS — G709 Myoneural disorder, unspecified: Secondary | ICD-10-CM | POA: Insufficient documentation

## 2019-04-23 DIAGNOSIS — I4819 Other persistent atrial fibrillation: Secondary | ICD-10-CM | POA: Diagnosis not present

## 2019-04-23 DIAGNOSIS — E871 Hypo-osmolality and hyponatremia: Secondary | ICD-10-CM | POA: Insufficient documentation

## 2019-04-23 DIAGNOSIS — I110 Hypertensive heart disease with heart failure: Secondary | ICD-10-CM | POA: Diagnosis not present

## 2019-04-23 DIAGNOSIS — F329 Major depressive disorder, single episode, unspecified: Secondary | ICD-10-CM | POA: Insufficient documentation

## 2019-04-23 DIAGNOSIS — D649 Anemia, unspecified: Secondary | ICD-10-CM | POA: Insufficient documentation

## 2019-04-23 DIAGNOSIS — I509 Heart failure, unspecified: Secondary | ICD-10-CM | POA: Diagnosis not present

## 2019-04-23 DIAGNOSIS — J45909 Unspecified asthma, uncomplicated: Secondary | ICD-10-CM | POA: Diagnosis not present

## 2019-04-23 HISTORY — PX: CARDIOVERSION: SHX1299

## 2019-04-23 HISTORY — DX: Cardiac arrhythmia, unspecified: I49.9

## 2019-04-23 SURGERY — CARDIOVERSION
Anesthesia: General

## 2019-04-23 MED ORDER — SODIUM CHLORIDE 0.9 % IV SOLN
INTRAVENOUS | Status: DC | PRN
  Administered 2019-04-23: 08:00:00 via INTRAVENOUS

## 2019-04-23 MED ORDER — SUCCINYLCHOLINE CHLORIDE 20 MG/ML IJ SOLN
INTRAMUSCULAR | Status: AC
  Filled 2019-04-23: qty 1

## 2019-04-23 MED ORDER — FENTANYL CITRATE (PF) 100 MCG/2ML IJ SOLN: 25.0000 ug | INTRAMUSCULAR | Status: DC | PRN

## 2019-04-23 MED ORDER — PROPOFOL 10 MG/ML IV BOLUS
INTRAVENOUS | Status: AC
  Filled 2019-04-23: qty 20

## 2019-04-23 MED ORDER — ONDANSETRON HCL 4 MG/2ML IJ SOLN: 4.0000 mg | Freq: Once | INTRAMUSCULAR | Status: DC | PRN

## 2019-04-23 MED ORDER — PROPOFOL 10 MG/ML IV BOLUS
INTRAVENOUS | Status: DC | PRN
  Administered 2019-04-23: 40 mg via INTRAVENOUS

## 2019-04-23 NOTE — CV Procedure (Signed)
    Cardioversion Note  [name] [id number] 1934/07/31  Procedure: DC Cardioversion Indications: Persistent atrial fibrillation  Procedure Details Consent: Obtained Time Out: Verified patient identification, verified procedure, site/side was marked, verified correct patient position, special equipment/implants available, Radiology Safety Procedures followed,  medications/allergies/relevent history reviewed, required imaging and test results available.  Performed  The patient has been on adequate anticoagulation.  The patient received IV propofol by anesthesia for sedation.  Synchronous cardioversion was performed at 120 joules.  The cardioversion was successful with restoration of sinus rhythm with PAC's.  Complications: No apparent complications Patient did tolerate procedure well.  Nelva Bush., MD 04/23/2019, 7:38 AM

## 2019-04-23 NOTE — Anesthesia Postprocedure Evaluation (Signed)
Anesthesia Post Note  Patient: [name]  Procedure(s) Performed: CARDIOVERSION (N/A )  Patient location during evaluation: Specials Recovery Anesthesia Type: General Level of consciousness: awake and alert and oriented Pain management: pain level controlled Vital Signs Assessment: post-procedure vital signs reviewed and stable Respiratory status: spontaneous breathing Cardiovascular status: blood pressure returned to baseline Anesthetic complications: no     Last Vitals:  Vitals:   04/23/19 0745 04/23/19 0800  BP: (!) 94/52 (!) 111/52  Pulse: 71 72  Resp: 19 18  Temp:    SpO2: 100% 100%    Last Pain:  Vitals:   04/23/19 0800  TempSrc:   PainSc: 0-No pain                 Seona Clemenson

## 2019-04-23 NOTE — Anesthesia Preprocedure Evaluation (Addendum)
Anesthesia Evaluation  Patient identified by MRN, date of birth, ID band Patient awake    Reviewed: Allergy & Precautions, NPO status , Patient's Chart, lab work & pertinent test results  History of Anesthesia Complications Negative for: history of anesthetic complications  Airway Mallampati: II  TM Distance: >3 FB     Dental  (+) Teeth Intact   Pulmonary asthma , sleep apnea (hx, none now) ,    Pulmonary exam normal        Cardiovascular hypertension, Pt. on medications and Pt. on home beta blockers + CAD and +CHF  + dysrhythmias Atrial Fibrillation  Rhythm:Irregular     Neuro/Psych PSYCHIATRIC DISORDERS Anxiety Depression TIA Neuromuscular disease    GI/Hepatic GERD  Medicated,  Endo/Other  negative endocrine ROS  Renal/GU Renal InsufficiencyRenal disease Bladder dysfunction      Musculoskeletal  (+) Arthritis , Osteoarthritis,    Abdominal Normal abdominal exam  (+)   Peds  Hematology  (+) anemia ,   Anesthesia Other Findings Past Medical History: No date: (HFpEF) heart failure with preserved ejection fraction ([place])     Comment:  a. 02/2019 Echo: Ef 60-65%, mildly reduced RV fxn. RVSP               36mmHg. Mildly dil LA. Mod dil RA. Mild to mod TR. Mild               to mod AS. Triv AI. No date: Allergy No date: Anemia No date: Anxiety No date: Arthritis     Comment:  rheumatoid No date: Asthma No date: Basal cell carcinoma     Comment:  back No date: CAD (coronary artery disease) No date: CHF (congestive heart failure) (HCC) No date: Chronic kidney disease     Comment:  Mass right kidney No date: Chronic sinusitis No date: Collagen vascular disease (HCC)     Comment:  Rheumatoid Arthritis No date: Depression No date: Diverticulitis No date: Dysrhythmia No date: ED (erectile dysfunction) No date: GERD (gastroesophageal reflux disease) No date: History of SIADH No date: Hx of adenomatous colonic  polyps No date: Hypertension No date: Hyponatremia No date: IBS (irritable bowel syndrome) No date: Neurodermatitis No date: Neurogenic bladder No date: OSA (obstructive sleep apnea) No date: Persistent atrial fibrillation     Comment:  a. Dx 02/2019; b. CHA2DS2VASc = 6-->eliquis initiated. No date: Prostate cancer ([place]) No date: Renal mass     Comment:  10/2018 4.2cm R renal cortical mass. Most compatible w/               clear cell renal cell carcinoma.  Reproductive/Obstetrics                            Anesthesia Physical  Anesthesia Plan  ASA: IV  Anesthesia Plan: General   Post-op Pain Management:    Induction: Intravenous  PONV Risk Score and Plan: Propofol infusion  Airway Management Planned: Nasal Cannula  Additional Equipment:   Intra-op Plan:   Post-operative Plan:   Informed Consent: I have reviewed the patients History and Physical, chart, labs and discussed the procedure including the risks, benefits and alternatives for the proposed anesthesia with the patient or authorized representative who has indicated his/her understanding and acceptance.       Plan Discussed with: CRNA and Surgeon  Anesthesia Plan Comments:        Anesthesia Quick Evaluation

## 2019-04-23 NOTE — Anesthesia Post-op Follow-up Note (Signed)
Anesthesia QCDR form completed.        

## 2019-04-23 NOTE — Transfer of Care (Signed)
Immediate Anesthesia Transfer of Care Note  Patient: [name]  Procedure(s) Performed: CARDIOVERSION (N/A )  Patient Location: PACU  Anesthesia Type:General  Level of Consciousness: sedated  Airway & Oxygen Therapy: Patient Spontanous Breathing and Patient connected to nasal cannula oxygen  Post-op Assessment: Report given to RN and Post -op Vital signs reviewed and stable  Post vital signs: Reviewed and stable  Last Vitals:  Vitals Value Taken Time  BP 119/56 04/23/2019  7:39 AM  Temp    Pulse 76 04/23/2019  7:40 AM  Resp 19 04/23/2019  7:40 AM  SpO2 100 % 04/23/2019  7:40 AM  Vitals shown include unvalidated device data.  Last Pain:  Vitals:   04/23/19 0739  TempSrc:   PainSc: 0-No pain         Complications: No apparent anesthesia complications

## 2019-04-23 NOTE — Interval H&P Note (Signed)
History and Physical Interval Note:  04/23/2019 7:34 AM  [name]  has presented today for surgery, with the diagnosis of atrial fibrillation.  The various methods of treatment have been discussed with the patient and family. After consideration of risks, benefits and other options for treatment, the patient has consented to  Procedure(s): CARDIOVERSION (N/A) as a surgical intervention.  The patient's history has been reviewed, patient examined, no change in status, stable for surgery.  I have reviewed the patient's chart and labs.  Questions were answered to the patient's satisfaction.     [name]

## 2019-04-23 NOTE — Addendum Note (Signed)
Addended by: Anselm Pancoast on: 04/23/2019 10:25 AM   Modules accepted: Orders

## 2019-04-24 ENCOUNTER — Ambulatory Visit: Payer: Medicare PPO

## 2019-04-25 ENCOUNTER — Ambulatory Visit: Payer: Medicare PPO | Admitting: Cardiovascular Disease

## 2019-04-25 DIAGNOSIS — C641 Malignant neoplasm of right kidney, except renal pelvis: Secondary | ICD-10-CM | POA: Diagnosis not present

## 2019-04-25 DIAGNOSIS — G8929 Other chronic pain: Secondary | ICD-10-CM | POA: Diagnosis not present

## 2019-04-25 DIAGNOSIS — M0579 Rheumatoid arthritis with rheumatoid factor of multiple sites without organ or systems involvement: Secondary | ICD-10-CM | POA: Diagnosis not present

## 2019-04-25 DIAGNOSIS — D696 Thrombocytopenia, unspecified: Secondary | ICD-10-CM | POA: Diagnosis not present

## 2019-04-25 DIAGNOSIS — M25512 Pain in left shoulder: Secondary | ICD-10-CM | POA: Diagnosis not present

## 2019-04-25 DIAGNOSIS — R609 Edema, unspecified: Secondary | ICD-10-CM | POA: Diagnosis not present

## 2019-04-26 ENCOUNTER — Ambulatory Visit (INDEPENDENT_AMBULATORY_CARE_PROVIDER_SITE_OTHER): Payer: Medicare PPO | Admitting: *Deleted

## 2019-04-26 ENCOUNTER — Other Ambulatory Visit: Payer: Self-pay

## 2019-04-26 DIAGNOSIS — J4550 Severe persistent asthma, uncomplicated: Secondary | ICD-10-CM

## 2019-04-26 MED ORDER — OMALIZUMAB 150 MG ~~LOC~~ SOLR
375.0000 mg | Freq: Once | SUBCUTANEOUS | Status: AC
  Administered 2019-04-26: 16:00:00 375 mg via SUBCUTANEOUS

## 2019-04-26 NOTE — Patient Instructions (Signed)
[name] came in on 04/26/2019 to receive a Xolair injection. The patient is given 375mg  every 15 days. Due to each vial equalling 150mg , 75mg  of medication was wasted.

## 2019-04-26 NOTE — Progress Notes (Signed)
Have you been hospitalized within the last 10 days?  No Do you have a fever?  No Do you have a cough?  No Do you have a headache or sore throat? No  

## 2019-04-29 ENCOUNTER — Ambulatory Visit: Payer: Medicare PPO

## 2019-04-29 ENCOUNTER — Other Ambulatory Visit: Payer: Self-pay

## 2019-04-30 ENCOUNTER — Other Ambulatory Visit: Payer: Self-pay | Admitting: Internal Medicine

## 2019-04-30 ENCOUNTER — Telehealth: Payer: Self-pay | Admitting: Cardiovascular Disease

## 2019-04-30 NOTE — Telephone Encounter (Signed)
    [facility] Pre-operative Risk Assessment    Request for surgical clearance:  1. What type of surgery is being performed? Right hand assist radical nephrectomy  2. When is this surgery scheduled? TBD   3. What type of clearance is required (medical clearance vs. Pharmacy clearance to hold med vs. Both)? both  4. Are there any medications that need to be held prior to surgery and how long? Eliquis x 3 days prior to surgery 5. Practice name and name of physician performing surgery? [facility]  6. What is your office phone number [phone]   7.   What is your office fax number [phone]  8.   Anesthesia type (None, local, MAC, general) ? Not listed   [name] 04/30/2019, 1:01 PM  _________________________________________________________________   (provider comments below)

## 2019-05-01 ENCOUNTER — Ambulatory Visit: Payer: Medicare PPO

## 2019-05-01 NOTE — Telephone Encounter (Signed)
Last filled 03-18-19 #90 Last OV 12-13-18 Next OV 05-09-19 [facility] [address] [employer] [address]

## 2019-05-02 ENCOUNTER — Ambulatory Visit: Payer: Medicare PPO | Admitting: Cardiovascular Disease

## 2019-05-02 ENCOUNTER — Telehealth: Payer: Self-pay | Admitting: Cardiovascular Disease

## 2019-05-02 NOTE — Telephone Encounter (Signed)
Pt in office appt rescheduled with Dr.Arida for 05/03/19 @ 2pm. Spoke with the pt daughter [name]. [name] made aware of the appt date and time. Also that they will need to enter through [facility], adv her to also allot enough time to get to our office for the appt time.  [name] verbalized understanding and voiced appreciation for the assistance.

## 2019-05-02 NOTE — Telephone Encounter (Signed)
Patient cancelled today by provider due to downtime.  Patient and wife declined to see APP but want asap with any provider asap as need for surgical clearance is urgent.   Explained to patient wife [name] was familiar with patient chart and changing md's is not preferred method and that someone new would not be as familiar with patient and clearance needs at this time.    Patient wife understands above and still declined an appt with [facility] tomorrow for clearance but also feels Dr. Fletcher Anon next available in July is not acceptable .    Please advise or contact patient

## 2019-05-03 ENCOUNTER — Telehealth: Payer: Self-pay

## 2019-05-03 ENCOUNTER — Encounter: Payer: Self-pay | Admitting: Cardiovascular Disease

## 2019-05-03 ENCOUNTER — Ambulatory Visit: Payer: Medicare PPO | Admitting: Cardiovascular Disease

## 2019-05-03 ENCOUNTER — Other Ambulatory Visit: Payer: Self-pay

## 2019-05-03 VITALS — BP 110/58 | HR 65 | Temp 97.3°F | Ht 69.0 in | Wt 173.5 lb

## 2019-05-03 DIAGNOSIS — I5032 Chronic diastolic (congestive) heart failure: Secondary | ICD-10-CM

## 2019-05-03 DIAGNOSIS — I359 Nonrheumatic aortic valve disorder, unspecified: Secondary | ICD-10-CM

## 2019-05-03 DIAGNOSIS — I4819 Other persistent atrial fibrillation: Secondary | ICD-10-CM | POA: Diagnosis not present

## 2019-05-03 NOTE — Progress Notes (Signed)
Cardiology Office Note   Date:  05/03/2019   ID:  [name], DOB 05-04-1934, MRN [id number]  PCP:  [name], MD  Cardiologist:   Kathlyn Sacramento, MD   Chief Complaint  Patient presents with  . other    1 week s/p cardioversion. Meds reviewed by the pt. verbally. "doing well."       History of Present Illness: [name] is a 85 y.o. male who presents for a follow-up visit regarding recently  atrial fibrillation and preop cardiovascular evaluation before nephrectomy.   The patient has chronic medical conditions that include rheumatoid arthritis, history of TIA, hyperlipidemia, chronic hyponatremia due to suspected SIADH and osteoarthritis. He had an echocardiogram done in June 2019 which showed normal LV systolic function with very mild aortic stenosis and mild mitral regurgitation. He has been followed by urology for right renal mass that was diagnosed last year but has increased in size.  He was supposed to get nephrectomy done in March but it was postponed due to COVID-19. The patient started having worsening leg edema and weight gain in April.  He was referred for an echocardiogram which showed normal LV systolic function with mild pulmonary hypertension.  He was noted to be in atrial fibrillation.  He was switched from Plavix to Eliquis 5 mg twice daily and was also started on metoprolol 25 mg twice daily.   He was thought to have diastolic heart failure related to atrial fibrillation.  The dose of furosemide was increased to 40 mg once daily .  He responded very well to diuresis with significant drop in weight.  He underwent successful cardioversion on May 26.  He has been doing well with no recent chest pain or shortness of breath.  Leg edema resolved completely.   He had issues with hypotension in the past while he was on metoprolol.   Past Medical History:  Diagnosis Date  . (HFpEF) heart failure with preserved ejection fraction ([place])    a. 02/2019 Echo: Ef 60-65%,  mildly reduced RV fxn. RVSP 16mmHg. Mildly dil LA. Mod dil RA. Mild to mod TR. Mild to mod AS. Triv AI.  Marland Kitchen Allergy   . Anemia   . Anxiety   . Arthritis    rheumatoid  . Asthma   . Basal cell carcinoma    back  . CAD (coronary artery disease)   . CHF (congestive heart failure) ([place])   . Chronic kidney disease    Mass right kidney  . Chronic sinusitis   . Collagen vascular disease (HCC)    Rheumatoid Arthritis  . Depression   . Diverticulitis   . Dysrhythmia   . ED (erectile dysfunction)   . GERD (gastroesophageal reflux disease)   . History of SIADH   . Hx of adenomatous colonic polyps   . Hypertension   . Hyponatremia   . IBS (irritable bowel syndrome)   . Neurodermatitis   . Neurogenic bladder   . OSA (obstructive sleep apnea)   . Persistent atrial fibrillation    a. Dx 02/2019; b. CHA2DS2VASc = 6-->eliquis initiated.  . Prostate cancer ([place])   . Renal mass    10/2018 4.2cm R renal cortical mass. Most compatible w/ clear cell renal cell carcinoma.    Past Surgical History:  Procedure Laterality Date  . CARDIOVERSION N/A 04/23/2019   Procedure: CARDIOVERSION;  Surgeon: Nelva Bush, MD;  Location: [facility] ORS;  Service: Cardiovascular;  Laterality: N/A;  . KNEE ARTHROPLASTY Right 09/02/2015  Procedure: COMPUTER ASSISTED TOTAL KNEE ARTHROPLASTY;  Surgeon: [name], MD;  Location: [facility] ORS;  Service: Orthopedics;  Laterality: Right;  . NASAL SINUS SURGERY  2009   DEVIATED SEPTUM AND POLYPS  . permanent indwelling catheter    . PROSTATE SURGERY     PROSTATECTOMY  . TOTAL KNEE ARTHROPLASTY Left 9/15   Dr [name]  . URETHRAL STRICTURE DILATATION  02-2010   Dr.Cope     Current Outpatient Medications  Medication Sig Dispense Refill  . acetaminophen (TYLENOL) 500 MG tablet Take 1,000 mg by mouth every 6 (six) hours as needed.    Marland Kitchen albuterol (PROVENTIL HFA;VENTOLIN HFA) 108 (90 Base) MCG/ACT inhaler Inhale 2 puffs into the lungs every 6 (six) hours as needed for  wheezing or shortness of breath. 1 Inhaler 3  . ALPRAZolam (XANAX) 0.5 MG tablet TAKE 1 TABLET BY MOUTH THREE TIMES DAILY AS NEEDED FOR ANXIETY OR SLEEP 90 tablet 0  . apixaban (ELIQUIS) 5 MG TABS tablet Take 1 tablet (5 mg total) by mouth 2 (two) times daily. 60 tablet 6  . Cholecalciferol (VITAMIN D3) 5000 UNITS CAPS Take 1 capsule by mouth daily.    . Coenzyme Q10 (CO Q 10) 100 MG CAPS Take 1 capsule by mouth daily as needed.     . Cranberry 425 MG CAPS Take 2 capsules by mouth daily.    . famotidine (PEPCID) 20 MG tablet Take 1 tablet (20 mg total) by mouth daily. 30 tablet 2  . fluticasone (FLONASE) 50 MCG/ACT nasal spray Place 2 sprays into both nostrils 2 (two) times daily. 48 g 3  . folic acid (FOLVITE) 1 MG tablet Take 1 tablet (1 mg total) by mouth daily. 90 tablet 3  . furosemide (LASIX) 20 MG tablet Take 1 tablet (20 mg total) by mouth daily.    Marland Kitchen gabapentin (NEURONTIN) 300 MG capsule TAKE 1 CAPSULE BY MOUTH THREE TIMES DAILY 90 capsule 11  . hydroxychloroquine (PLAQUENIL) 200 MG tablet Take 1 tablet by mouth 2 (two) times daily.    . methotrexate (RHEUMATREX) 2.5 MG tablet Take 10 tablets by mouth once a week.    . metoprolol tartrate (LOPRESSOR) 25 MG tablet Take 1 tablet (25 mg total) by mouth 2 (two) times daily. 180 tablet 3  . Milk Thistle 250 MG CAPS Take 1 capsule by mouth daily as needed.     . Multiple Vitamin (MULTIVITAMIN) tablet Take 1 tablet by mouth daily.      Marland Kitchen MYRBETRIQ 50 MG TB24 tablet TAKE 1 TABLET(50 MG) BY MOUTH DAILY 30 tablet 3  . Omalizumab (XOLAIR ) Inject into the skin continuous.    . polyethylene glycol (GLYCOLAX) packet Take 17 g by mouth at bedtime.     . pravastatin (PRAVACHOL) 20 MG tablet TAKE 1 TABLET BY MOUTH EVERY DAY (Patient taking differently: Take 20 mg by mouth at bedtime. ) 90 tablet 3  . Probiotic Product (ALIGN) 4 MG CAPS Take 1 capsule (4 mg total) by mouth daily. 15 capsule 0  . Selenium 200 MCG CAPS Take 1 capsule by mouth daily as  needed.     . sodium chloride 1 g tablet Take 1 tablet (1 g total) by mouth 2 (two) times daily with a meal. (Patient taking differently: Take 1 g by mouth 3 (three) times daily with meals. ) 60 tablet 1  . Turmeric 500 MG CAPS Take 500 mg by mouth daily as needed.     . vitamin B-12 (CYANOCOBALAMIN) 500 MCG tablet Take  500 mcg by mouth as needed.      Current Facility-Administered Medications  Medication Dose Route Frequency Provider Last Rate Last Dose  . omalizumab Arvid Right) prefilled syringe 300 mg  300 mg Subcutaneous Once Chesley Mires, MD      . omalizumab Arvid Right) prefilled syringe 75 mg  75 mg Subcutaneous Once Chesley Mires, MD        Allergies:   Ciprofloxacin; Citalopram hydrobromide; Clindamycin; Lorazepam; Paroxetine; Ramipril; Simvastatin; and Sulfa antibiotics    Social History:  The patient  reports that he has never smoked. He has never used smokeless tobacco. He reports current alcohol use. He reports that he does not use drugs.   Family History:  The patient's family history includes Heart disease (age of onset: 56) in his mother; Pneumonia (age of onset: 90) in his father; Skin cancer in his father, sister, and son.    ROS:  Please see the history of present illness.   Otherwise, review of systems are positive for none.   All other systems are reviewed and negative.    PHYSICAL EXAM: VS:  Temp (!) 97.3 F (36.3 C)   Ht 5\' 9"  (1.753 m)   Wt 173 lb 8 oz (78.7 kg)   BMI 25.62 kg/m  , BMI Body mass index is 25.62 kg/m. GEN: Well nourished, well developed, in no acute distress  HEENT: normal  Neck: no JVD, carotid bruits, or masses Cardiac: Regular rate and rhythm; no  rubs, or gallops,no edema .  2 out of 6 systolic murmur in the aortic area which is mid peaking Respiratory:  clear to auscultation bilaterally, normal work of breathing GI: soft, nontender, nondistended, + BS MS: no deformity or atrophy  Skin: warm and dry, no rash Neuro:  Strength and sensation are  intact Psych: euthymic mood, full affect   EKG:  EKG is ordered today. The ekg ordered today demonstrates normal sinus rhythm with right bundle branch block and left anterior fascicular block.  Frequent PACs.   Recent Labs: 10/28/2018: ALT 15 04/19/2019: BUN 19; Creatinine, Ser 0.55; Hemoglobin 10.1; Platelets 171; Potassium 3.8; Sodium 138; TSH 3.159    Lipid Panel    Component Value Date/Time   CHOL 114 05/26/2018 0411   TRIG 49 05/26/2018 0411   HDL 43 05/26/2018 0411   CHOLHDL 2.7 05/26/2018 0411   VLDL 10 05/26/2018 0411   LDLCALC 61 05/26/2018 0411      Wt Readings from Last 3 Encounters:  05/03/19 173 lb 8 oz (78.7 kg)  04/23/19 179 lb (81.2 kg)  03/28/19 194 lb (88 kg)       PAD Screen 12/28/2018  Previous PAD dx? No  Previous surgical procedure? (No Data)  Pain with walking? Yes  Subsides with rest? Yes  Feet/toe relief with dangling? No  Painful, non-healing ulcers? No  Extremities discolored? Yes      ASSESSMENT AND PLAN:  1.  Persistent atrial fibrillation: Status post recent successful cardioversion.  He is maintaining in sinus rhythm.  Continue current dose of metoprolol.  Continue anticoagulation with Eliquis.   If systolic blood pressure drops below 100, I recommend decreasing metoprolol to 12.5 mg twice daily.  2.  Chronic diastolic heart failure: He appears to be euvolemic on current dose of furosemide 20 mg once daily.  3.  Preop cardiovascular evaluation for nephrectomy: He has no anginal symptoms.  Recent echo showed normal LV systolic function with only mild to moderate aortic stenosis.  No need for ischemic cardiac evaluation.  Eliquis  can be stopped 2 days before the surgery.  4.  Aortic stenosis: This was mild to moderate on most recent echocardiogram and seems to be moderate by physical exam.  Repeat echocardiogram in 1 year from now.  5.  Weight loss: The patient lost significant amount of weight.  Part of this was likely volume.   However, he appears to be euvolemic today and recent labs showed no evidence of volume depletion.  His weight loss might be related to underlying malignancy.   Disposition:   FU in 3 months.  Signed,  Kathlyn Sacramento, MD  05/03/2019 2:16 PM    [place]

## 2019-05-03 NOTE — Patient Instructions (Signed)
Medication Instructions:  Your physician recommends that you continue on your current medications as directed. Please refer to the Current Medication list given to you today.  If you need a refill on your cardiac medications before your next appointment, please call your pharmacy.   Lab work: None ordered If you have labs (blood work) drawn today and your tests are completely normal, you will receive your results only by: Marland Kitchen MyChart Message (if you have MyChart) OR . A paper copy in the mail If you have any lab test that is abnormal or we need to change your treatment, we will call you to review the results.  Testing/Procedures: None ordered  Follow-Up: At [facility], you and your health needs are our priority.  As part of our continuing mission to provide you with exceptional heart care, we have created designated Provider Care Teams.  These Care Teams include your primary Cardiologist (physician) and Advanced Practice Providers (APPs -  Physician Assistants and Nurse Practitioners) who all work together to provide you with the care you need, when you need it. You will need a follow up appointment in 3 months.  Please call our office 2 months in advance to schedule this appointment.  You may see Kathlyn Sacramento, MD or one of the following Advanced Practice Providers on your designated Care Team:   Murray Hodgkins, NP Christell Faith, PA-C . Marrianne Mood, PA-C

## 2019-05-03 NOTE — Telephone Encounter (Signed)

## 2019-05-06 ENCOUNTER — Other Ambulatory Visit: Payer: Self-pay | Admitting: Radiology

## 2019-05-06 DIAGNOSIS — N2889 Other specified disorders of kidney and ureter: Secondary | ICD-10-CM

## 2019-05-09 ENCOUNTER — Ambulatory Visit: Payer: Medicare PPO | Admitting: Internal Medicine

## 2019-05-09 ENCOUNTER — Encounter: Payer: Self-pay | Admitting: Internal Medicine

## 2019-05-09 ENCOUNTER — Other Ambulatory Visit: Payer: Self-pay

## 2019-05-09 VITALS — BP 110/60 | HR 68 | Temp 97.7°F | Ht 69.0 in | Wt 172.0 lb

## 2019-05-09 DIAGNOSIS — I480 Paroxysmal atrial fibrillation: Secondary | ICD-10-CM | POA: Diagnosis not present

## 2019-05-09 DIAGNOSIS — E222 Syndrome of inappropriate secretion of antidiuretic hormone: Secondary | ICD-10-CM | POA: Diagnosis not present

## 2019-05-09 DIAGNOSIS — M05742 Rheumatoid arthritis with rheumatoid factor of left hand without organ or systems involvement: Secondary | ICD-10-CM | POA: Diagnosis not present

## 2019-05-09 DIAGNOSIS — N2889 Other specified disorders of kidney and ureter: Secondary | ICD-10-CM | POA: Diagnosis not present

## 2019-05-09 DIAGNOSIS — F39 Unspecified mood [affective] disorder: Secondary | ICD-10-CM | POA: Diagnosis not present

## 2019-05-09 DIAGNOSIS — M05741 Rheumatoid arthritis with rheumatoid factor of right hand without organ or systems involvement: Secondary | ICD-10-CM | POA: Diagnosis not present

## 2019-05-09 NOTE — Progress Notes (Signed)
Subjective:    Patient ID: [name], male    DOB: 12/28/1933, 85 y.o.   MRN: [id number]  HPI Here with daughter for follow up of chronic health conditions Has lost a lot of weight---20# more lately This is the lowest adult weight he has been at He lost the most after increasing the  furosemide after the atrial fib diagnosed  Had cardioversion  Remains in sinus now Recent visit to Dr Fletcher Anon--- cleared to proceed for surgery  Continues on the plaquenil for his RA Sees Dr Jefm Bryant  Now will have the kidney surgery next month Delayed twice Will have nephrectomy  Anxiety seems to be controlled Lots of medical issues and stress--but handling this well  Current Outpatient Medications on File Prior to Visit  Medication Sig Dispense Refill  . acetaminophen (TYLENOL) 500 MG tablet Take 1,000 mg by mouth every 6 (six) hours as needed.    Marland Kitchen albuterol (PROVENTIL HFA;VENTOLIN HFA) 108 (90 Base) MCG/ACT inhaler Inhale 2 puffs into the lungs every 6 (six) hours as needed for wheezing or shortness of breath. 1 Inhaler 3  . ALPRAZolam (XANAX) 0.5 MG tablet TAKE 1 TABLET BY MOUTH THREE TIMES DAILY AS NEEDED FOR ANXIETY OR SLEEP 90 tablet 0  . apixaban (ELIQUIS) 5 MG TABS tablet Take 1 tablet (5 mg total) by mouth 2 (two) times daily. 60 tablet 6  . Cholecalciferol (VITAMIN D3) 5000 UNITS CAPS Take 1 capsule by mouth daily.    . Coenzyme Q10 (CO Q 10) 100 MG CAPS Take 1 capsule by mouth daily as needed.     . Cranberry 425 MG CAPS Take 2 capsules by mouth daily.    . famotidine (PEPCID) 20 MG tablet Take 1 tablet (20 mg total) by mouth daily. 30 tablet 2  . fluticasone (FLONASE) 50 MCG/ACT nasal spray Place 2 sprays into both nostrils 2 (two) times daily. 48 g 3  . folic acid (FOLVITE) 1 MG tablet Take 1 tablet (1 mg total) by mouth daily. 90 tablet 3  . furosemide (LASIX) 20 MG tablet Take 1 tablet (20 mg total) by mouth daily.    Marland Kitchen gabapentin (NEURONTIN) 300 MG capsule TAKE 1 CAPSULE BY MOUTH  THREE TIMES DAILY 90 capsule 11  . hydroxychloroquine (PLAQUENIL) 200 MG tablet Take 1 tablet by mouth 2 (two) times daily.    . methotrexate (RHEUMATREX) 2.5 MG tablet Take 10 tablets by mouth once a week.    . metoprolol tartrate (LOPRESSOR) 25 MG tablet Take 1 tablet (25 mg total) by mouth 2 (two) times daily. 180 tablet 3  . Milk Thistle 250 MG CAPS Take 1 capsule by mouth daily as needed.     . Multiple Vitamin (MULTIVITAMIN) tablet Take 1 tablet by mouth daily.      Marland Kitchen MYRBETRIQ 50 MG TB24 tablet TAKE 1 TABLET(50 MG) BY MOUTH DAILY 30 tablet 3  . Omalizumab (XOLAIR [place]) Inject into the skin continuous.    . polyethylene glycol (GLYCOLAX) packet Take 17 g by mouth at bedtime.     . pravastatin (PRAVACHOL) 20 MG tablet TAKE 1 TABLET BY MOUTH EVERY DAY (Patient taking differently: Take 20 mg by mouth at bedtime. ) 90 tablet 3  . Probiotic Product (ALIGN) 4 MG CAPS Take 1 capsule (4 mg total) by mouth daily. 15 capsule 0  . Selenium 200 MCG CAPS Take 1 capsule by mouth daily as needed.     . sodium chloride 1 g tablet Take 1 tablet (1 g total)  by mouth 2 (two) times daily with a meal. (Patient taking differently: Take 1 g by mouth 3 (three) times daily with meals. ) 60 tablet 1  . Turmeric 500 MG CAPS Take 500 mg by mouth daily as needed.     . vitamin B-12 (CYANOCOBALAMIN) 500 MCG tablet Take 500 mcg by mouth as needed.      Current Facility-Administered Medications on File Prior to Visit  Medication Dose Route Frequency Provider Last Rate Last Dose  . omalizumab Arvid Right) prefilled syringe 300 mg  300 mg Subcutaneous Once Chesley Mires, MD      . omalizumab Arvid Right) prefilled syringe 75 mg  75 mg Subcutaneous Once Chesley Mires, MD        Allergies  Allergen Reactions  . Ciprofloxacin Nausea And Vomiting    Headache  . Citalopram Hydrobromide Other (See Comments)    "unknown"  . Clindamycin Nausea And Vomiting  . Lorazepam     Adverse reaction  . Paroxetine Nausea Only  . Ramipril  Other (See Comments)    "unknown"  . Simvastatin   . Sulfa Antibiotics Other (See Comments)    Past Medical History:  Diagnosis Date  . (HFpEF) heart failure with preserved ejection fraction ([place])    a. 02/2019 Echo: Ef 60-65%, mildly reduced RV fxn. RVSP 45mmHg. Mildly dil LA. Mod dil RA. Mild to mod TR. Mild to mod AS. Triv AI.  Marland Kitchen Allergy   . Anemia   . Anxiety   . Arthritis    rheumatoid  . Asthma   . Basal cell carcinoma    back  . CAD (coronary artery disease)   . CHF (congestive heart failure) ([place])   . Chronic kidney disease    Mass right kidney  . Chronic sinusitis   . Collagen vascular disease (HCC)    Rheumatoid Arthritis  . Depression   . Diverticulitis   . Dysrhythmia   . ED (erectile dysfunction)   . GERD (gastroesophageal reflux disease)   . History of SIADH   . Hx of adenomatous colonic polyps   . Hypertension   . Hyponatremia   . IBS (irritable bowel syndrome)   . Neurodermatitis   . Neurogenic bladder   . OSA (obstructive sleep apnea)   . Persistent atrial fibrillation    a. Dx 02/2019; b. CHA2DS2VASc = 6-->eliquis initiated.  . Prostate cancer ([place])   . Renal mass    10/2018 4.2cm R renal cortical mass. Most compatible w/ clear cell renal cell carcinoma.    Past Surgical History:  Procedure Laterality Date  . CARDIOVERSION N/A 04/23/2019   Procedure: CARDIOVERSION;  Surgeon: Nelva Bush, MD;  Location: [facility] ORS;  Service: Cardiovascular;  Laterality: N/A;  . KNEE ARTHROPLASTY Right 09/02/2015   Procedure: COMPUTER ASSISTED TOTAL KNEE ARTHROPLASTY;  Surgeon: [name], MD;  Location: [facility] ORS;  Service: Orthopedics;  Laterality: Right;  . NASAL SINUS SURGERY  2009   DEVIATED SEPTUM AND POLYPS  . permanent indwelling catheter    . PROSTATE SURGERY     PROSTATECTOMY  . TOTAL KNEE ARTHROPLASTY Left 9/15   Dr [name]  . URETHRAL STRICTURE DILATATION  02-2010   Dr.Cope    Family History  Problem Relation Age of Onset  . Heart disease  Mother 36       heart failure  . Pneumonia Father 68       pnemonia  . Skin cancer Father   . Skin cancer Sister   . Skin cancer Son   .  Colon cancer Neg Hx   . Esophageal cancer Neg Hx   . Stomach cancer Neg Hx   . Pancreatic cancer Neg Hx   . Liver disease Neg Hx     Social History   Socioeconomic History  . Marital status: Married    Spouse name: Not on file  . Number of children: 2  . Years of education: Not on file  . Highest education level: Not on file  Occupational History  . 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 Financial resource strain: Not on file  . Food insecurity    Worry: Not on file    Inability: Not on file  . Transportation needs    Medical: Not on file    Non-medical: Not on file  Tobacco Use  . Smoking status: Never Smoker  . Smokeless tobacco: Never Used  Substance and Sexual Activity  . Alcohol use: Yes    Comment: 5 days out of 7 drinks gin   . Drug use: No  . Sexual activity: Not Currently  Lifestyle  . Physical activity    Days per week: Not on file    Minutes per session: Not on file  . Stress: Not on file  Relationships  . Social Herbalist on phone: Not on file    Gets together: Not on file    Attends religious service: Not on file    Active member of club or organization: Not on file    Attends meetings of clubs or organizations: Not on file    Relationship status: Not on file  . Intimate partner violence    Fear of current or ex partner: Not on file    Emotionally abused: Not on file    Physically abused: Not on file    Forced sexual activity: Not on file  Other Topics Concern  . Not on file  Social History Narrative   Not sure about a living will or health care POA   Wife should make health care decisions for him--then daughter, then son   Would accept resuscitation attempts   Not sure about tube feeds   Review of Systems Eating fairly well--appetite is fine Has stopped all  his alcohol and snacks with this Sleeps fair     Objective:   Physical Exam  Constitutional: No distress.  Neck: No thyromegaly present.  Cardiovascular: Normal rate, regular rhythm and normal heart sounds. Exam reveals no gallop.  No murmur heard. Respiratory: Effort normal and breath sounds normal. No respiratory distress. He has no wheezes. He has no rales.  GI: Soft. There is no abdominal tenderness.  Musculoskeletal:        General: No edema.  Lymphadenopathy:    He has no cervical adenopathy.  Psychiatric: He has a normal mood and affect. His behavior is normal.           Assessment & Plan:

## 2019-05-09 NOTE — Assessment & Plan Note (Signed)
Apparent renal cell carcinoma Due for nephrectomy next month

## 2019-05-09 NOTE — Assessment & Plan Note (Signed)
Chronic anxiety Dysthymia is better Has the xanax for prn use---pretty regular

## 2019-05-09 NOTE — Assessment & Plan Note (Signed)
Ongoing pain Discussed resistance straining for muscle strengthening--especially with upcoming surgery

## 2019-05-09 NOTE — Assessment & Plan Note (Signed)
Cardioverted and still regular

## 2019-05-09 NOTE — Assessment & Plan Note (Signed)
Sodium normal now with the furosemide and fluid restriction

## 2019-05-13 ENCOUNTER — Ambulatory Visit: Payer: Medicare PPO

## 2019-05-13 DIAGNOSIS — R600 Localized edema: Secondary | ICD-10-CM | POA: Diagnosis not present

## 2019-05-13 DIAGNOSIS — D649 Anemia, unspecified: Secondary | ICD-10-CM | POA: Diagnosis not present

## 2019-05-13 DIAGNOSIS — E871 Hypo-osmolality and hyponatremia: Secondary | ICD-10-CM | POA: Diagnosis not present

## 2019-05-14 ENCOUNTER — Other Ambulatory Visit: Payer: Self-pay

## 2019-05-14 ENCOUNTER — Ambulatory Visit (INDEPENDENT_AMBULATORY_CARE_PROVIDER_SITE_OTHER): Payer: Medicare PPO

## 2019-05-14 DIAGNOSIS — J4550 Severe persistent asthma, uncomplicated: Secondary | ICD-10-CM | POA: Diagnosis not present

## 2019-05-14 MED ORDER — OMALIZUMAB 150 MG/ML ~~LOC~~ SOSY
300.0000 mg | PREFILLED_SYRINGE | Freq: Once | SUBCUTANEOUS | Status: AC
  Administered 2019-05-14: 12:00:00 300 mg via SUBCUTANEOUS

## 2019-05-14 MED ORDER — OMALIZUMAB 75 MG/0.5ML ~~LOC~~ SOSY
75.0000 mg | PREFILLED_SYRINGE | Freq: Once | SUBCUTANEOUS | Status: AC
  Administered 2019-05-14: 75 mg via SUBCUTANEOUS

## 2019-05-14 NOTE — Progress Notes (Signed)
Have you been hospitalized within the last 10 days?  No Do you have a fever?  No Do you have a cough?  No Do you have a headache or sore throat? No  

## 2019-05-16 ENCOUNTER — Ambulatory Visit: Payer: Medicare PPO

## 2019-05-20 ENCOUNTER — Telehealth: Payer: Self-pay | Admitting: Pulmonary Disease

## 2019-05-20 NOTE — Telephone Encounter (Signed)
Xolair Prefilled Syringe Order: 150mg  Prefilled Syringe:  #4 75mg  Prefilled Syringe: #2 Ordered Date: 05/20/2019 Expected date of arrival: 05/21/2019 Ordered by: Desmond Dike, [place]

## 2019-05-21 ENCOUNTER — Other Ambulatory Visit: Payer: Self-pay

## 2019-05-21 ENCOUNTER — Ambulatory Visit
Admission: RE | Admit: 2019-05-21 | Discharge: 2019-05-21 | Disposition: A | Discharge status: Home / Self Care | Payer: Medicare PPO | Source: Ambulatory Visit | Attending: Urology | Admitting: Urology

## 2019-05-21 DIAGNOSIS — N2889 Other specified disorders of kidney and ureter: Secondary | ICD-10-CM | POA: Diagnosis not present

## 2019-05-21 DIAGNOSIS — I700 Atherosclerosis of aorta: Secondary | ICD-10-CM | POA: Diagnosis not present

## 2019-05-21 MED ORDER — IOHEXOL 300 MG/ML  SOLN
100.0000 mL | Freq: Once | INTRAMUSCULAR | Status: AC | PRN
  Administered 2019-05-21: 100 mL via INTRAVENOUS

## 2019-05-21 NOTE — Telephone Encounter (Signed)
Xolair Prefilled Syringe Received:  150mg  Prefilled Syringe >> quantity 4, lot # [id number], exp date 01/2020 75mg  Prefilled Syringe >> quantity 2, lot # [id number], exp date 02/2020 Medication arrival date: 05/21/2019 Received by: TBS

## 2019-05-22 ENCOUNTER — Encounter: Payer: Self-pay | Admitting: Urology

## 2019-05-22 ENCOUNTER — Ambulatory Visit (INDEPENDENT_AMBULATORY_CARE_PROVIDER_SITE_OTHER): Payer: Medicare PPO | Admitting: Urology

## 2019-05-22 VITALS — BP 105/69 | HR 67 | Ht 69.0 in | Wt 173.0 lb

## 2019-05-22 DIAGNOSIS — N2889 Other specified disorders of kidney and ureter: Secondary | ICD-10-CM

## 2019-05-22 DIAGNOSIS — N319 Neuromuscular dysfunction of bladder, unspecified: Secondary | ICD-10-CM | POA: Diagnosis not present

## 2019-05-22 DIAGNOSIS — Z01818 Encounter for other preprocedural examination: Secondary | ICD-10-CM | POA: Diagnosis not present

## 2019-05-22 NOTE — Progress Notes (Signed)
Cath Change/ Replacement  Patient is present today for a catheter change due to urinary retention.  62ml of water was removed from the balloon, a 16FR foley cath was removed with out difficulty.  Patient was cleaned and prepped in a sterile fashion with betadine and 2% lidocaine jelly was instilled into the urethra. A 16 FR foley cath was replaced into the bladder no complications were noted Urine return was noted ml and urine was yellow in color. The balloon was filled with 45ml of sterile water. A  bag was attached for drainage.  A night bag was also given to the patient and patient was given instruction on how to change from one bag to another. Patient was given proper instruction on catheter care.    Preformed by: Elberta Leatherwood, CMA  Follow up: 1 month

## 2019-05-23 ENCOUNTER — Other Ambulatory Visit: Payer: Self-pay | Admitting: Urology

## 2019-05-23 DIAGNOSIS — C641 Malignant neoplasm of right kidney, except renal pelvis: Secondary | ICD-10-CM | POA: Diagnosis not present

## 2019-05-23 DIAGNOSIS — M0579 Rheumatoid arthritis with rheumatoid factor of multiple sites without organ or systems involvement: Secondary | ICD-10-CM | POA: Diagnosis not present

## 2019-05-23 DIAGNOSIS — M25512 Pain in left shoulder: Secondary | ICD-10-CM | POA: Diagnosis not present

## 2019-05-23 DIAGNOSIS — G8929 Other chronic pain: Secondary | ICD-10-CM | POA: Diagnosis not present

## 2019-05-23 NOTE — Telephone Encounter (Signed)
Needs refill for Myrbetriq

## 2019-05-24 NOTE — H&P (View-Only) (Signed)
05/22/2019 2:24 PM   [name] Aug 11, 1934 [id number]  Referring provider: [name], MD [address] [place],  [place] [address]  Chief Complaint  Patient presents with  . Follow-up    CT results    HPI: 85 year old male presents today as birthday for preop for upcoming right hand-assisted laparoscopic nephrectomy.  He was initially scheduled for surgery in March, however in light of COVID-19 pandemic and then further complications including A. fib requiring cardioversion and 30 days of continuous anticoagulation, history is been rescheduled to next month.  Since his last visit, he reports that he is doing better.  He had exacerbation of CHF requiring Lasix and then an episode of A. fib which is now resolved.  He is been cleared by cardiology for surgery.  In terms of his renal mass, he has an enlarging right renal mass which appears to extend into the renal sinus.  Given his last imaging was quite some time ago, this was repeated prior to today's visit.  This shows essentially stable 3.4 x 3.4 x 3.5 right-sided renal mass which is endophytic and appears to extend into the renal sinus.  The vein appears patent and there is no evidence of lymphadenopathy.  Notably, this mass has enlarged in the past.  He also has an indeterminate 1.5 cm right upper pole renal mass which is primarily cystic in nature.  No flank pain or gross hematuria.  No weight loss.  He is very anxious about surgery but also is very much looking forward to it in order to move forward.  He reports that he is a "cancer phobic".  He has many questions today regarding his methotrexate, anticoagulation, preoperative prep for surgery, etc.  They have been working on strength and mobility leading up to surgery to optimize his recovery.   PMH: Past Medical History:  Diagnosis Date  . (HFpEF) heart failure with preserved ejection fraction ([place])    a. 02/2019 Echo: Ef 60-65%, mildly reduced RV fxn. RVSP  87mmHg. Mildly dil LA. Mod dil RA. Mild to mod TR. Mild to mod AS. Triv AI.  Marland Kitchen Allergy   . Anemia   . Anxiety   . Arthritis    rheumatoid  . Asthma   . Basal cell carcinoma    back  . CAD (coronary artery disease)   . CHF (congestive heart failure) ([place])   . Chronic kidney disease    Mass right kidney  . Chronic sinusitis   . Collagen vascular disease (HCC)    Rheumatoid Arthritis  . Depression   . Diverticulitis   . Dysrhythmia   . ED (erectile dysfunction)   . GERD (gastroesophageal reflux disease)   . History of SIADH   . Hx of adenomatous colonic polyps   . Hypertension   . Hyponatremia   . IBS (irritable bowel syndrome)   . Neurodermatitis   . Neurogenic bladder   . OSA (obstructive sleep apnea)   . Persistent atrial fibrillation    a. Dx 02/2019; b. CHA2DS2VASc = 6-->eliquis initiated.  . Prostate cancer ([place])   . Renal mass    10/2018 4.2cm R renal cortical mass. Most compatible w/ clear cell renal cell carcinoma.    Surgical History: Past Surgical History:  Procedure Laterality Date  . CARDIOVERSION N/A 04/23/2019   Procedure: CARDIOVERSION;  Surgeon: Nelva Bush, MD;  Location: [facility] ORS;  Service: Cardiovascular;  Laterality: N/A;  . KNEE ARTHROPLASTY Right 09/02/2015   Procedure: COMPUTER ASSISTED TOTAL KNEE ARTHROPLASTY;  Surgeon: [name], MD;  Location: [facility] ORS;  Service: Orthopedics;  Laterality: Right;  . NASAL SINUS SURGERY  2009   DEVIATED SEPTUM AND POLYPS  . permanent indwelling catheter    . PROSTATE SURGERY     PROSTATECTOMY  . TOTAL KNEE ARTHROPLASTY Left 9/15   Dr [name]  . URETHRAL STRICTURE DILATATION  02-2010   Dr.Cope    Home Medications:  Allergies as of 05/22/2019      Reactions   Ciprofloxacin Nausea And Vomiting   Headache   Citalopram Hydrobromide Other (See Comments)   "unknown"   Clindamycin Nausea And Vomiting   Lorazepam    Adverse reaction   Paroxetine Nausea Only   Ramipril Other (See Comments)   "unknown"    Simvastatin    Sulfa Antibiotics Other (See Comments)      Medication List       Accurate as of May 22, 2019 11:59 PM. If you have any questions, ask your nurse or doctor.        acetaminophen 500 MG tablet Commonly known as: TYLENOL Take 1,000 mg by mouth every 6 (six) hours as needed.   albuterol 108 (90 Base) MCG/ACT inhaler Commonly known as: VENTOLIN HFA Inhale 2 puffs into the lungs every 6 (six) hours as needed for wheezing or shortness of breath.   Align 4 MG Caps Take 1 capsule (4 mg total) by mouth daily.   ALPRAZolam 0.5 MG tablet Commonly known as: XANAX TAKE 1 TABLET BY MOUTH THREE TIMES DAILY AS NEEDED FOR ANXIETY OR SLEEP   apixaban 5 MG Tabs tablet Commonly known as: Eliquis Take 1 tablet (5 mg total) by mouth 2 (two) times daily.   Co Q 10 100 MG Caps Take 1 capsule by mouth daily as needed.   Cranberry 425 MG Caps Take 2 capsules by mouth daily.   famotidine 20 MG tablet Commonly known as: PEPCID Take 1 tablet (20 mg total) by mouth daily.   fluticasone 50 MCG/ACT nasal spray Commonly known as: FLONASE Place 2 sprays into both nostrils 2 (two) times daily.   folic acid 1 MG tablet Commonly known as: FOLVITE Take 1 tablet (1 mg total) by mouth daily.   furosemide 20 MG tablet Commonly known as: LASIX Take 1 tablet (20 mg total) by mouth daily.   gabapentin 300 MG capsule Commonly known as: NEURONTIN TAKE 1 CAPSULE BY MOUTH THREE TIMES DAILY   hydroxychloroquine 200 MG tablet Commonly known as: PLAQUENIL Take 1 tablet by mouth 2 (two) times daily.   methotrexate 2.5 MG tablet Commonly known as: RHEUMATREX Take 10 tablets by mouth once a week.   metoprolol tartrate 25 MG tablet Commonly known as: LOPRESSOR Take 1 tablet (25 mg total) by mouth 2 (two) times daily.   Milk Thistle 250 MG Caps Take 1 capsule by mouth daily as needed.   multivitamin tablet Take 1 tablet by mouth daily.   Myrbetriq 50 MG Tb24 tablet Generic drug:  mirabegron ER TAKE 1 TABLET(50 MG) BY MOUTH DAILY   polyethylene glycol 17 g packet Commonly known as: MIRALAX / GLYCOLAX Take 17 g by mouth at bedtime.   pravastatin 20 MG tablet Commonly known as: PRAVACHOL TAKE 1 TABLET BY MOUTH EVERY DAY What changed: when to take this   Selenium 200 MCG Caps Take 1 capsule by mouth daily as needed.   sodium chloride 1 g tablet Take 1 tablet (1 g total) by mouth 2 (two) times daily with a meal. What changed:  when to take this   Turmeric 500 MG Caps Take 500 mg by mouth daily as needed.   vitamin B-12 500 MCG tablet Commonly known as: CYANOCOBALAMIN Take 500 mcg by mouth as needed.   Vitamin D3 125 MCG (5000 UT) Caps Take 1 capsule by mouth daily.   XOLAIR [place] Inject into the skin continuous.       Allergies:  Allergies  Allergen Reactions  . Ciprofloxacin Nausea And Vomiting    Headache  . Citalopram Hydrobromide Other (See Comments)    "unknown"  . Clindamycin Nausea And Vomiting  . Lorazepam     Adverse reaction  . Paroxetine Nausea Only  . Ramipril Other (See Comments)    "unknown"  . Simvastatin   . Sulfa Antibiotics Other (See Comments)    Family History: Family History  Problem Relation Age of Onset  . Heart disease Mother 73       heart failure  . Pneumonia Father 27       pnemonia  . Skin cancer Father   . Skin cancer Sister   . Skin cancer Son   . Colon cancer Neg Hx   . Esophageal cancer Neg Hx   . Stomach cancer Neg Hx   . Pancreatic cancer Neg Hx   . Liver disease Neg Hx     Social History:  reports that he has never smoked. He has never used smokeless tobacco. He reports current alcohol use. He reports that he does not use drugs.  ROS: UROLOGY Frequent Urination?: No Hard to postpone urination?: No Burning/pain with urination?: No Get up at night to urinate?: No Leakage of urine?: No Urine stream starts and stops?: No Trouble starting stream?: No Do you have to strain to urinate?: No  Blood in urine?: No Urinary tract infection?: No Sexually transmitted disease?: No Injury to kidneys or bladder?: No Painful intercourse?: No Weak stream?: No Erection problems?: No Penile pain?: No  Gastrointestinal Nausea?: No Vomiting?: No Indigestion/heartburn?: No Diarrhea?: No Constipation?: No  Constitutional Fever: No Night sweats?: No Weight loss?: Yes Fatigue?: Yes  Skin Skin rash/lesions?: No Itching?: No  Eyes Blurred vision?: No Double vision?: No  Ears/Nose/Throat Sore throat?: No Sinus problems?: No  Hematologic/Lymphatic Swollen glands?: No Easy bruising?: Yes  Cardiovascular Leg swelling?: No Chest pain?: No  Respiratory Cough?: No Shortness of breath?: No  Endocrine Excessive thirst?: No  Musculoskeletal Back pain?: Yes Joint pain?: Yes  Neurological Headaches?: No Dizziness?: No  Psychologic Depression?: No Anxiety?: Yes  Physical Exam: BP 105/69 (BP Location: Left Arm, Patient Position: Sitting, Cuff Size: Normal)   Pulse 67   Ht 5\' 9"  (1.753 m)   Wt 173 lb (78.5 kg)   BMI 25.55 kg/m   Constitutional:  Alert and oriented, No acute distress.  In wheelchair.  Accompanied by daughter. HEENT: [place] AT, moist mucus membranes.  Trachea midline, no masses. Cardiovascular: No clubbing, cyanosis, or edema. Respiratory: Normal respiratory effort, no increased work of breathing. GI: Abdomen is soft, nontender, nondistended, no abdominal masses GU: No CVA tenderness.  Chronic indwelling Foley, changed today. Skin: No rashes, bruises or suspicious lesions. Neurologic: Grossly intact, no focal deficits, moving all 4 extremities. Psychiatric: Normal mood and affect.  Laboratory Data: Lab Results  Component Value Date   WBC 5.5 04/19/2019   HGB 10.1 (L) 04/19/2019   HCT 32.6 (L) 04/19/2019   MCV 97.9 04/19/2019   PLT 171 04/19/2019    Lab Results  Component Value Date   CREATININE 0.55 (L)  04/19/2019    Lab Results   Component Value Date   PSA 0.00 (L) 04/27/2017   PSA 0.01 (L) 05/18/2016   PSA 0.01 (L) 01/26/2015    Lab Results  Component Value Date   HGBA1C 5.1 05/26/2018    Pertinent Imaging: CLINICAL DATA:  Evaluate kidney mass  EXAM: CT ABDOMEN WITH CONTRAST  TECHNIQUE: Multidetector CT imaging of the abdomen was performed using the standard protocol following bolus administration of intravenous contrast.  CONTRAST:  144mL OMNIPAQUE IOHEXOL 300 MG/ML  SOLN  COMPARISON:  03/29/2019  FINDINGS: Lower chest: Resolution of small right pleural effusion. Unchanged pleural thickening overlying the posterior left lower lobe. Calcified granuloma noted within the lingula.  Hepatobiliary: No suspicious liver abnormality. The gallbladder appears within normal limits. No biliary dilatation.  Pancreas: Unremarkable. No pancreatic ductal dilatation or surrounding inflammatory changes.  Spleen: Normal in size without focal abnormality.  Adrenals/Urinary Tract: Normal appearance of the adrenal glands. Multiple areas of cortical scarring are noted involving the right kidney. Enhancing mass arising from the inferior pole of left kidney measures 3.4 by 3.4 by 3.5 cm. Previously 3.0 by 3.6 by 3.4 cm. The mass appears centered around the inferior pole renal sinus and extends into the posterior and medial cortex of the lower pole. Right renal vein appears patent. Although favored to represent a renal cell carcinoma, urothelial neoplasm is not excluded. Simple appearing cyst arises from posterior cortex intermediate attenuating exophytic lesion arising from posterior cortex of the upper pole of right kidney measures 1.8 cm and 44 HU postcontrast, image 25/2. No suspicious left kidney lesions.  Stomach/Bowel: Stomach is within normal limits. No evidence of bowel wall thickening, distention, or inflammatory changes.  Vascular/Lymphatic: Aortic atherosclerosis. No aneurysm. No  abdominopelvic adenopathy.  Other: Scoliosis and degenerative disc disease.  Musculoskeletal: No aggressive lytic or sclerotic bone lesions.  IMPRESSION: 1. Solid enhancing mass within the inferior pole of the right kidney is favored to represent renal cell carcinoma. There is no evidence for renal vein thrombosis. No adenopathy or evidence of solid organ metastasis within the abdomen. 2. Intermediate density lesion arising from upper pole of right kidney is noted measuring 44 HU. Favor benign hemorrhagic or proteinaceous cyst. Cystic neoplasm not excluded. Consider further evaluation with contrast enhanced MRI of the kidneys. 3.  Aortic Atherosclerosis (ICD10-I70.0).   Electronically Signed   By: Kerby Moors M.D.   On: 05/21/2019 15:33  CT scan personally reviewed.  Agree with radiologic interpretation.  Assessment & Plan:    1. Right renal mass Plan for right hand-assisted laparoscopic nephrectomy per previous conversations  New imaging was reviewed again today which indicates very little change.  He understands that there is approximately 10 to 20% chance this may represent a benign lesion.  If it is in fact renal cell carcinoma, its likely T3 into the sinus fat based on imaging and this nephrectomy is warranted if he chooses to pursue that.  Surveillance could be an option as well given his age and comorbidities.  He is adamantly in favor of nephrectomy and understands this may lead to progression of renal insufficiency.  Risk of surgery were reviewed again today including risk of bleeding, infection, damage surrounding structures, hernia formation, bowel injury, renal insufficiency, amongst others.  He understands that he is also at risk for additional complications including MI, PE, exacerbation of his underlying medical issues.  He understands willing to proceed as planned.  In terms of preoperative planning, we will plan on holding his anticoagulation for  2 days  prior to surgery.  Also hold methotrexate at least 1 week before surgery and may need dose adjustment postop.  2. Neurogenic bladder Chronic indwelling Foley, will be due for Foley exchange around the time is adamant that he needs a latex catheter not silicone.  3. Pre-operative examination As above   Hollice Espy, MD  [place] [address], [place], [place] [address] [phone]  I spent 25 min with this patient of which greater than 50% was spent in counseling and coordination of care with the patient.

## 2019-05-24 NOTE — Progress Notes (Signed)
05/22/2019 2:24 PM   [name] July 22, 1934 [id number]  Referring provider: [name], MD [address] [place],  [place] [address]  Chief Complaint  Patient presents with  . Follow-up    CT results    HPI: 85 year old male presents today as birthday for preop for upcoming right hand-assisted laparoscopic nephrectomy.  He was initially scheduled for surgery in March, however in light of COVID-19 pandemic and then further complications including A. fib requiring cardioversion and 30 days of continuous anticoagulation, history is been rescheduled to next month.  Since his last visit, he reports that he is doing better.  He had exacerbation of CHF requiring Lasix and then an episode of A. fib which is now resolved.  He is been cleared by cardiology for surgery.  In terms of his renal mass, he has an enlarging right renal mass which appears to extend into the renal sinus.  Given his last imaging was quite some time ago, this was repeated prior to today's visit.  This shows essentially stable 3.4 x 3.4 x 3.5 right-sided renal mass which is endophytic and appears to extend into the renal sinus.  The vein appears patent and there is no evidence of lymphadenopathy.  Notably, this mass has enlarged in the past.  He also has an indeterminate 1.5 cm right upper pole renal mass which is primarily cystic in nature.  No flank pain or gross hematuria.  No weight loss.  He is very anxious about surgery but also is very much looking forward to it in order to move forward.  He reports that he is a "cancer phobic".  He has many questions today regarding his methotrexate, anticoagulation, preoperative prep for surgery, etc.  They have been working on strength and mobility leading up to surgery to optimize his recovery.   PMH: Past Medical History:  Diagnosis Date  . (HFpEF) heart failure with preserved ejection fraction ([place])    a. 02/2019 Echo: Ef 60-65%, mildly reduced RV fxn. RVSP  21mmHg. Mildly dil LA. Mod dil RA. Mild to mod TR. Mild to mod AS. Triv AI.  Marland Kitchen Allergy   . Anemia   . Anxiety   . Arthritis    rheumatoid  . Asthma   . Basal cell carcinoma    back  . CAD (coronary artery disease)   . CHF (congestive heart failure) ([place])   . Chronic kidney disease    Mass right kidney  . Chronic sinusitis   . Collagen vascular disease (HCC)    Rheumatoid Arthritis  . Depression   . Diverticulitis   . Dysrhythmia   . ED (erectile dysfunction)   . GERD (gastroesophageal reflux disease)   . History of SIADH   . Hx of adenomatous colonic polyps   . Hypertension   . Hyponatremia   . IBS (irritable bowel syndrome)   . Neurodermatitis   . Neurogenic bladder   . OSA (obstructive sleep apnea)   . Persistent atrial fibrillation    a. Dx 02/2019; b. CHA2DS2VASc = 6-->eliquis initiated.  . Prostate cancer ([place])   . Renal mass    10/2018 4.2cm R renal cortical mass. Most compatible w/ clear cell renal cell carcinoma.    Surgical History: Past Surgical History:  Procedure Laterality Date  . CARDIOVERSION N/A 04/23/2019   Procedure: CARDIOVERSION;  Surgeon: Nelva Bush, MD;  Location: [facility] ORS;  Service: Cardiovascular;  Laterality: N/A;  . KNEE ARTHROPLASTY Right 09/02/2015   Procedure: COMPUTER ASSISTED TOTAL KNEE ARTHROPLASTY;  Surgeon: [name], MD;  Location: [facility] ORS;  Service: Orthopedics;  Laterality: Right;  . NASAL SINUS SURGERY  2009   DEVIATED SEPTUM AND POLYPS  . permanent indwelling catheter    . PROSTATE SURGERY     PROSTATECTOMY  . TOTAL KNEE ARTHROPLASTY Left 9/15   Dr [name]  . URETHRAL STRICTURE DILATATION  02-2010   Dr.Cope    Home Medications:  Allergies as of 05/22/2019      Reactions   Ciprofloxacin Nausea And Vomiting   Headache   Citalopram Hydrobromide Other (See Comments)   "unknown"   Clindamycin Nausea And Vomiting   Lorazepam    Adverse reaction   Paroxetine Nausea Only   Ramipril Other (See Comments)   "unknown"    Simvastatin    Sulfa Antibiotics Other (See Comments)      Medication List       Accurate as of May 22, 2019 11:59 PM. If you have any questions, ask your nurse or doctor.        acetaminophen 500 MG tablet Commonly known as: TYLENOL Take 1,000 mg by mouth every 6 (six) hours as needed.   albuterol 108 (90 Base) MCG/ACT inhaler Commonly known as: VENTOLIN HFA Inhale 2 puffs into the lungs every 6 (six) hours as needed for wheezing or shortness of breath.   Align 4 MG Caps Take 1 capsule (4 mg total) by mouth daily.   ALPRAZolam 0.5 MG tablet Commonly known as: XANAX TAKE 1 TABLET BY MOUTH THREE TIMES DAILY AS NEEDED FOR ANXIETY OR SLEEP   apixaban 5 MG Tabs tablet Commonly known as: Eliquis Take 1 tablet (5 mg total) by mouth 2 (two) times daily.   Co Q 10 100 MG Caps Take 1 capsule by mouth daily as needed.   Cranberry 425 MG Caps Take 2 capsules by mouth daily.   famotidine 20 MG tablet Commonly known as: PEPCID Take 1 tablet (20 mg total) by mouth daily.   fluticasone 50 MCG/ACT nasal spray Commonly known as: FLONASE Place 2 sprays into both nostrils 2 (two) times daily.   folic acid 1 MG tablet Commonly known as: FOLVITE Take 1 tablet (1 mg total) by mouth daily.   furosemide 20 MG tablet Commonly known as: LASIX Take 1 tablet (20 mg total) by mouth daily.   gabapentin 300 MG capsule Commonly known as: NEURONTIN TAKE 1 CAPSULE BY MOUTH THREE TIMES DAILY   hydroxychloroquine 200 MG tablet Commonly known as: PLAQUENIL Take 1 tablet by mouth 2 (two) times daily.   methotrexate 2.5 MG tablet Commonly known as: RHEUMATREX Take 10 tablets by mouth once a week.   metoprolol tartrate 25 MG tablet Commonly known as: LOPRESSOR Take 1 tablet (25 mg total) by mouth 2 (two) times daily.   Milk Thistle 250 MG Caps Take 1 capsule by mouth daily as needed.   multivitamin tablet Take 1 tablet by mouth daily.   Myrbetriq 50 MG Tb24 tablet Generic drug:  mirabegron ER TAKE 1 TABLET(50 MG) BY MOUTH DAILY   polyethylene glycol 17 g packet Commonly known as: MIRALAX / GLYCOLAX Take 17 g by mouth at bedtime.   pravastatin 20 MG tablet Commonly known as: PRAVACHOL TAKE 1 TABLET BY MOUTH EVERY DAY What changed: when to take this   Selenium 200 MCG Caps Take 1 capsule by mouth daily as needed.   sodium chloride 1 g tablet Take 1 tablet (1 g total) by mouth 2 (two) times daily with a meal. What changed:  when to take this   Turmeric 500 MG Caps Take 500 mg by mouth daily as needed.   vitamin B-12 500 MCG tablet Commonly known as: CYANOCOBALAMIN Take 500 mcg by mouth as needed.   Vitamin D3 125 MCG (5000 UT) Caps Take 1 capsule by mouth daily.   XOLAIR [place] Inject into the skin continuous.       Allergies:  Allergies  Allergen Reactions  . Ciprofloxacin Nausea And Vomiting    Headache  . Citalopram Hydrobromide Other (See Comments)    "unknown"  . Clindamycin Nausea And Vomiting  . Lorazepam     Adverse reaction  . Paroxetine Nausea Only  . Ramipril Other (See Comments)    "unknown"  . Simvastatin   . Sulfa Antibiotics Other (See Comments)    Family History: Family History  Problem Relation Age of Onset  . Heart disease Mother 52       heart failure  . Pneumonia Father 9       pnemonia  . Skin cancer Father   . Skin cancer Sister   . Skin cancer Son   . Colon cancer Neg Hx   . Esophageal cancer Neg Hx   . Stomach cancer Neg Hx   . Pancreatic cancer Neg Hx   . Liver disease Neg Hx     Social History:  reports that he has never smoked. He has never used smokeless tobacco. He reports current alcohol use. He reports that he does not use drugs.  ROS: UROLOGY Frequent Urination?: No Hard to postpone urination?: No Burning/pain with urination?: No Get up at night to urinate?: No Leakage of urine?: No Urine stream starts and stops?: No Trouble starting stream?: No Do you have to strain to urinate?: No  Blood in urine?: No Urinary tract infection?: No Sexually transmitted disease?: No Injury to kidneys or bladder?: No Painful intercourse?: No Weak stream?: No Erection problems?: No Penile pain?: No  Gastrointestinal Nausea?: No Vomiting?: No Indigestion/heartburn?: No Diarrhea?: No Constipation?: No  Constitutional Fever: No Night sweats?: No Weight loss?: Yes Fatigue?: Yes  Skin Skin rash/lesions?: No Itching?: No  Eyes Blurred vision?: No Double vision?: No  Ears/Nose/Throat Sore throat?: No Sinus problems?: No  Hematologic/Lymphatic Swollen glands?: No Easy bruising?: Yes  Cardiovascular Leg swelling?: No Chest pain?: No  Respiratory Cough?: No Shortness of breath?: No  Endocrine Excessive thirst?: No  Musculoskeletal Back pain?: Yes Joint pain?: Yes  Neurological Headaches?: No Dizziness?: No  Psychologic Depression?: No Anxiety?: Yes  Physical Exam: BP 105/69 (BP Location: Left Arm, Patient Position: Sitting, Cuff Size: Normal)   Pulse 67   Ht 5\' 9"  (1.753 m)   Wt 173 lb (78.5 kg)   BMI 25.55 kg/m   Constitutional:  Alert and oriented, No acute distress.  In wheelchair.  Accompanied by daughter. HEENT: [place] AT, moist mucus membranes.  Trachea midline, no masses. Cardiovascular: No clubbing, cyanosis, or edema. Respiratory: Normal respiratory effort, no increased work of breathing. GI: Abdomen is soft, nontender, nondistended, no abdominal masses GU: No CVA tenderness.  Chronic indwelling Foley, changed today. Skin: No rashes, bruises or suspicious lesions. Neurologic: Grossly intact, no focal deficits, moving all 4 extremities. Psychiatric: Normal mood and affect.  Laboratory Data: Lab Results  Component Value Date   WBC 5.5 04/19/2019   HGB 10.1 (L) 04/19/2019   HCT 32.6 (L) 04/19/2019   MCV 97.9 04/19/2019   PLT 171 04/19/2019    Lab Results  Component Value Date   CREATININE 0.55 (L)  04/19/2019    Lab Results   Component Value Date   PSA 0.00 (L) 04/27/2017   PSA 0.01 (L) 05/18/2016   PSA 0.01 (L) 01/26/2015    Lab Results  Component Value Date   HGBA1C 5.1 05/26/2018    Pertinent Imaging: CLINICAL DATA:  Evaluate kidney mass  EXAM: CT ABDOMEN WITH CONTRAST  TECHNIQUE: Multidetector CT imaging of the abdomen was performed using the standard protocol following bolus administration of intravenous contrast.  CONTRAST:  162mL OMNIPAQUE IOHEXOL 300 MG/ML  SOLN  COMPARISON:  03/29/2019  FINDINGS: Lower chest: Resolution of small right pleural effusion. Unchanged pleural thickening overlying the posterior left lower lobe. Calcified granuloma noted within the lingula.  Hepatobiliary: No suspicious liver abnormality. The gallbladder appears within normal limits. No biliary dilatation.  Pancreas: Unremarkable. No pancreatic ductal dilatation or surrounding inflammatory changes.  Spleen: Normal in size without focal abnormality.  Adrenals/Urinary Tract: Normal appearance of the adrenal glands. Multiple areas of cortical scarring are noted involving the right kidney. Enhancing mass arising from the inferior pole of left kidney measures 3.4 by 3.4 by 3.5 cm. Previously 3.0 by 3.6 by 3.4 cm. The mass appears centered around the inferior pole renal sinus and extends into the posterior and medial cortex of the lower pole. Right renal vein appears patent. Although favored to represent a renal cell carcinoma, urothelial neoplasm is not excluded. Simple appearing cyst arises from posterior cortex intermediate attenuating exophytic lesion arising from posterior cortex of the upper pole of right kidney measures 1.8 cm and 44 HU postcontrast, image 25/2. No suspicious left kidney lesions.  Stomach/Bowel: Stomach is within normal limits. No evidence of bowel wall thickening, distention, or inflammatory changes.  Vascular/Lymphatic: Aortic atherosclerosis. No aneurysm. No  abdominopelvic adenopathy.  Other: Scoliosis and degenerative disc disease.  Musculoskeletal: No aggressive lytic or sclerotic bone lesions.  IMPRESSION: 1. Solid enhancing mass within the inferior pole of the right kidney is favored to represent renal cell carcinoma. There is no evidence for renal vein thrombosis. No adenopathy or evidence of solid organ metastasis within the abdomen. 2. Intermediate density lesion arising from upper pole of right kidney is noted measuring 44 HU. Favor benign hemorrhagic or proteinaceous cyst. Cystic neoplasm not excluded. Consider further evaluation with contrast enhanced MRI of the kidneys. 3.  Aortic Atherosclerosis (ICD10-I70.0).   Electronically Signed   By: Kerby Moors M.D.   On: 05/21/2019 15:33  CT scan personally reviewed.  Agree with radiologic interpretation.  Assessment & Plan:    1. Right renal mass Plan for right hand-assisted laparoscopic nephrectomy per previous conversations  New imaging was reviewed again today which indicates very little change.  He understands that there is approximately 10 to 20% chance this may represent a benign lesion.  If it is in fact renal cell carcinoma, its likely T3 into the sinus fat based on imaging and this nephrectomy is warranted if he chooses to pursue that.  Surveillance could be an option as well given his age and comorbidities.  He is adamantly in favor of nephrectomy and understands this may lead to progression of renal insufficiency.  Risk of surgery were reviewed again today including risk of bleeding, infection, damage surrounding structures, hernia formation, bowel injury, renal insufficiency, amongst others.  He understands that he is also at risk for additional complications including MI, PE, exacerbation of his underlying medical issues.  He understands willing to proceed as planned.  In terms of preoperative planning, we will plan on holding his anticoagulation for  2 days  prior to surgery.  Also hold methotrexate at least 1 week before surgery and may need dose adjustment postop.  2. Neurogenic bladder Chronic indwelling Foley, will be due for Foley exchange around the time is adamant that he needs a latex catheter not silicone.  3. Pre-operative examination As above   Hollice Espy, MD  [facility] [address] [place], [place] [address] [phone]  I spent 25 min with this patient of which greater than 50% was spent in counseling and coordination of care with the patient.

## 2019-05-27 ENCOUNTER — Ambulatory Visit: Payer: Medicare PPO

## 2019-05-28 ENCOUNTER — Ambulatory Visit: Payer: Medicare PPO

## 2019-05-29 ENCOUNTER — Ambulatory Visit (INDEPENDENT_AMBULATORY_CARE_PROVIDER_SITE_OTHER): Payer: Medicare PPO

## 2019-05-29 ENCOUNTER — Other Ambulatory Visit: Payer: Self-pay

## 2019-05-29 DIAGNOSIS — J4550 Severe persistent asthma, uncomplicated: Secondary | ICD-10-CM

## 2019-05-29 MED ORDER — OMALIZUMAB 75 MG/0.5ML ~~LOC~~ SOSY
75.0000 mg | PREFILLED_SYRINGE | Freq: Once | SUBCUTANEOUS | Status: AC
  Administered 2019-05-29: 17:00:00 75 mg via SUBCUTANEOUS

## 2019-05-29 MED ORDER — OMALIZUMAB 150 MG/ML ~~LOC~~ SOSY
300.0000 mg | PREFILLED_SYRINGE | Freq: Once | SUBCUTANEOUS | Status: AC
  Administered 2019-05-29: 17:00:00 300 mg via SUBCUTANEOUS

## 2019-05-29 NOTE — Progress Notes (Signed)
Have you been hospitalized within the last 10 days?  No Do you have a fever?  No Do you have a cough?  No Do you have a headache or sore throat? No  

## 2019-06-06 ENCOUNTER — Ambulatory Visit: Payer: Medicare PPO

## 2019-06-06 ENCOUNTER — Other Ambulatory Visit: Payer: Self-pay

## 2019-06-06 DIAGNOSIS — N2889 Other specified disorders of kidney and ureter: Secondary | ICD-10-CM | POA: Diagnosis not present

## 2019-06-06 DIAGNOSIS — Z01818 Encounter for other preprocedural examination: Secondary | ICD-10-CM | POA: Diagnosis not present

## 2019-06-06 NOTE — Progress Notes (Signed)
Patient's catheter was cleaned and plugged and urine sample was collected after 82min

## 2019-06-07 ENCOUNTER — Encounter: Payer: Self-pay | Admitting: Internal Medicine

## 2019-06-07 ENCOUNTER — Ambulatory Visit (INDEPENDENT_AMBULATORY_CARE_PROVIDER_SITE_OTHER): Payer: Medicare PPO | Admitting: Internal Medicine

## 2019-06-07 DIAGNOSIS — J029 Acute pharyngitis, unspecified: Secondary | ICD-10-CM | POA: Diagnosis not present

## 2019-06-07 LAB — URINALYSIS, COMPLETE
Bilirubin, UA: NEGATIVE
Glucose, UA: NEGATIVE
Ketones, UA: NEGATIVE
Nitrite, UA: POSITIVE — AB
Specific Gravity, UA: >1.03 — ABNORMAL HIGH (ref 1.005–1.030)
Urobilinogen, Ur: 0.2 mg/dL (ref 0.2–1.0)
pH, UA: 6.5 (ref 5.0–7.5)

## 2019-06-07 LAB — MICROSCOPIC EXAMINATION

## 2019-06-07 NOTE — Assessment & Plan Note (Signed)
Goes back 2 weeks but worsening He is especially concerned about strep--but I explained that it is very unlikely Not even clear that this is infectious Discussed symptomatic Rx He is extremely nervous though and wonders about getting seen at an urgent care [facility]). This is probably going to be necessary. Daughter will bring him and leave him in car till he is ready to be taken back--to reduce his possible exposure

## 2019-06-07 NOTE — Progress Notes (Signed)
Subjective:    Patient ID: [name], male    DOB: Jan 14, 1934, 85 y.o.   MRN: [id number]  HPI Visit due to sore throat  Interactive audio and video telecommunications were attempted between this provider and patient, however failed, due to patient having technical difficulties OR patient did not have access to video capability.  We continued and completed visit with audio only.   Virtual Visit via Video Note  I connected with [name] on 06/07/19 at  9:15 AM EDT by a video enabled telemedicine application and verified that I am speaking with the correct person using two identifiers.  Location: Patient: home, daughter is with him Provider: office   I discussed the limitations of evaluation and management by telemedicine and the availability of in person appointments. The patient expressed understanding and agreed to proceed.  History of Present Illness: Has had a sore throat---"coming on" for the past 2 weeks Mild at first--seemed to be getting better but has persisted Especially aware of it at night or in the morning Notices it with swallowing Now worse in the past couple of days Concerned due to upcoming nephrectomy  Is scheduled for COVID testing and preop testing next Thursday  No fever Daughter looked back in throat and it looked "a pale yellowish" and some red dots on the top  Gets sense in his ears at times No significant cough No SOB "Kinda" feels glands in his neck Does get sense of "gulp" when swallowing Some post nasal drip--but hasn't been doing his nasal irrigation that he should No heartburn No rash  Has remained at home other than doctor's visit Tylenol not helping Gargling with salt water hasn't helped Cough drops give some brief relief  Current Outpatient Medications on File Prior to Visit  Medication Sig Dispense Refill  . acetaminophen (TYLENOL) 500 MG tablet Take 500 mg by mouth every 8 (eight) hours as needed for moderate pain or headache.      . albuterol (PROVENTIL HFA;VENTOLIN HFA) 108 (90 Base) MCG/ACT inhaler Inhale 2 puffs into the lungs every 6 (six) hours as needed for wheezing or shortness of breath. 1 Inhaler 3  . ALPRAZolam (XANAX) 0.5 MG tablet TAKE 1 TABLET BY MOUTH THREE TIMES DAILY AS NEEDED FOR ANXIETY OR SLEEP (Patient taking differently: Take 1 mg by mouth at bedtime. ) 90 tablet 0  . apixaban (ELIQUIS) 5 MG TABS tablet Take 1 tablet (5 mg total) by mouth 2 (two) times daily. 60 tablet 6  . Cholecalciferol (VITAMIN D3) 5000 UNITS CAPS Take 5,000 Units by mouth daily.     . Coenzyme Q10 (CO Q 10) 100 MG CAPS Take 100 mg by mouth daily.     . fluticasone (FLONASE) 50 MCG/ACT nasal spray Place 2 sprays into both nostrils 2 (two) times daily. 48 g 3  . folic acid (FOLVITE) 329 MCG tablet Take 1,200 mcg by mouth daily.    . furosemide (LASIX) 20 MG tablet Take 1 tablet (20 mg total) by mouth daily.    Marland Kitchen gabapentin (NEURONTIN) 300 MG capsule TAKE 1 CAPSULE BY MOUTH THREE TIMES DAILY (Patient taking differently: Take 300 mg by mouth 3 (three) times daily. ) 90 capsule 11  . hydroxychloroquine (PLAQUENIL) 200 MG tablet Take 200 mg by mouth 2 (two) times daily.     . methotrexate (RHEUMATREX) 2.5 MG tablet Take 25 mg by mouth every Wednesday.     . metoprolol tartrate (LOPRESSOR) 25 MG tablet Take 1 tablet (25 mg total)  by mouth 2 (two) times daily. 180 tablet 3  . Multiple Vitamin (MULTIVITAMIN) tablet Take 1 tablet by mouth daily.      Marland Kitchen MYRBETRIQ 50 MG TB24 tablet TAKE 1 TABLET(50 MG) BY MOUTH DAILY (Patient taking differently: Take 50 mg by mouth daily. ) 30 tablet 3  . polyethylene glycol powder (GLYCOLAX/MIRALAX) 17 GM/SCOOP powder Take 17 g by mouth at bedtime.     . pravastatin (PRAVACHOL) 20 MG tablet TAKE 1 TABLET BY MOUTH EVERY DAY (Patient taking differently: Take 20 mg by mouth at bedtime. ) 90 tablet 3  . Probiotic Product (ALIGN) 4 MG CAPS Take 1 capsule (4 mg total) by mouth daily. 15 capsule 0  . Selenium 200  MCG CAPS Take 200 mcg by mouth daily.     . sodium chloride 1 g tablet Take 1 tablet (1 g total) by mouth 2 (two) times daily with a meal. (Patient taking differently: Take 1 g by mouth 3 (three) times daily with meals. ) 60 tablet 1  . Turmeric 500 MG CAPS Take 500 mg by mouth daily.      Current Facility-Administered Medications on File Prior to Visit  Medication Dose Route Frequency Provider Last Rate Last Dose  . omalizumab [place] Right) prefilled syringe 300 mg  300 mg Subcutaneous Once Chesley Mires, MD      . omalizumab Arvid Right) prefilled syringe 75 mg  75 mg Subcutaneous Once Chesley Mires, MD        Allergies  Allergen Reactions  . Ciprofloxacin Nausea And Vomiting    Headache  . Citalopram Hydrobromide Other (See Comments)    "unknown"  . Clindamycin Nausea And Vomiting  . Lorazepam Other (See Comments)    Adverse reaction  . Paroxetine Nausea Only  . Ramipril Other (See Comments)    "unknown"  . Simvastatin   . Sulfa Antibiotics Other (See Comments)    Past Medical History:  Diagnosis Date  . (HFpEF) heart failure with preserved ejection fraction ([place])    a. 02/2019 Echo: Ef 60-65%, mildly reduced RV fxn. RVSP 52mmHg. Mildly dil LA. Mod dil RA. Mild to mod TR. Mild to mod AS. Triv AI.  Marland Kitchen Allergy   . Anemia   . Anxiety   . Arthritis    rheumatoid  . Asthma   . Basal cell carcinoma    back  . CAD (coronary artery disease)   . CHF (congestive heart failure) ([place])   . Chronic kidney disease    Mass right kidney  . Chronic sinusitis   . Collagen vascular disease (HCC)    Rheumatoid Arthritis  . Depression   . Diverticulitis   . Dysrhythmia   . ED (erectile dysfunction)   . GERD (gastroesophageal reflux disease)   . History of SIADH   . Hx of adenomatous colonic polyps   . Hypertension   . Hyponatremia   . IBS (irritable bowel syndrome)   . Neurodermatitis   . Neurogenic bladder   . OSA (obstructive sleep apnea)   . Persistent atrial fibrillation    a. Dx  02/2019; b. CHA2DS2VASc = 6-->eliquis initiated.  . Prostate cancer ([place])   . Renal mass    10/2018 4.2cm R renal cortical mass. Most compatible w/ clear cell renal cell carcinoma.    Past Surgical History:  Procedure Laterality Date  . CARDIOVERSION N/A 04/23/2019   Procedure: CARDIOVERSION;  Surgeon: Nelva Bush, MD;  Location: [facility] ORS;  Service: Cardiovascular;  Laterality: N/A;  . KNEE ARTHROPLASTY Right 09/02/2015  Procedure: COMPUTER ASSISTED TOTAL KNEE ARTHROPLASTY;  Surgeon: [name], MD;  Location: [facility] ORS;  Service: Orthopedics;  Laterality: Right;  . NASAL SINUS SURGERY  2009   DEVIATED SEPTUM AND POLYPS  . permanent indwelling catheter    . PROSTATE SURGERY     PROSTATECTOMY  . TOTAL KNEE ARTHROPLASTY Left 9/15   Dr [name]  . URETHRAL STRICTURE DILATATION  02-2010   Dr.Cope    Family History  Problem Relation Age of Onset  . Heart disease Mother 63       heart failure  . Pneumonia Father 75       pnemonia  . Skin cancer Father   . Skin cancer Sister   . Skin cancer Son   . Colon cancer Neg Hx   . Esophageal cancer Neg Hx   . Stomach cancer Neg Hx   . Pancreatic cancer Neg Hx   . Liver disease Neg Hx     Social History   Socioeconomic History  . Marital status: Married    Spouse name: Not on file  . Number of children: 2  . Years of education: Not on file  . Highest education level: Not on file  Occupational History  . 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 Financial resource strain: Not on file  . Food insecurity    Worry: Not on file    Inability: Not on file  . Transportation needs    Medical: Not on file    Non-medical: Not on file  Tobacco Use  . Smoking status: Never Smoker  . Smokeless tobacco: Never Used  Substance and Sexual Activity  . Alcohol use: Yes    Comment: 5 days out of 7 drinks gin   . Drug use: No  . Sexual activity: Not Currently  Lifestyle  . Physical activity    Days per week:  Not on file    Minutes per session: Not on file  . Stress: Not on file  Relationships  . Social Herbalist on phone: Not on file    Gets together: Not on file    Attends religious service: Not on file    Active member of club or organization: Not on file    Attends meetings of clubs or organizations: Not on file    Relationship status: Not on file  . Intimate partner violence    Fear of current or ex partner: Not on file    Emotionally abused: Not on file    Physically abused: Not on file    Forced sexual activity: Not on file  Other Topics Concern  . Not on file  Social History Narrative   Not sure about a living will or health care POA   Wife should make health care decisions for him--then daughter, then son   Would accept resuscitation attempts   Not sure about tube feeds    Observations/Objective: Normal speech Very anxious about this  Assessment and Plan: See problem list  Follow Up Instructions:    I discussed the assessment and treatment plan with the patient. The patient was provided an opportunity to ask questions and all were answered. The patient agreed with the plan and demonstrated an understanding of the instructions.   The patient was advised to call back or seek an in-person evaluation if the symptoms worsen or if the condition fails to improve as anticipated.  I provided 17 minutes of non-face-to-face time during this  encounter.   Viviana Simpler, MD    Review of Systems     Objective:   Physical Exam         Assessment & Plan:

## 2019-06-09 LAB — CULTURE, URINE COMPREHENSIVE

## 2019-06-10 ENCOUNTER — Telehealth: Payer: Self-pay | Admitting: Radiology

## 2019-06-10 DIAGNOSIS — N390 Urinary tract infection, site not specified: Secondary | ICD-10-CM

## 2019-06-10 MED ORDER — CEPHALEXIN 500 MG PO CAPS: 500.0000 mg | ORAL_CAPSULE | Freq: Two times a day (BID) | ORAL | 0 refills | Status: AC

## 2019-06-10 NOTE — Telephone Encounter (Signed)
-----   Message from Hollice Espy, MD sent at 06/10/2019  9:11 AM EDT ----- I would like to treat him with Keflex 500 mg twice daily for 3 days prior to surgery to reduce his colony count in the setting of chronic bacterial colonization.  The goal is not to clear him of bacteria but reduce the number.  Hollice Espy, MD

## 2019-06-10 NOTE — Telephone Encounter (Signed)
Notified daughter of bacterial colonization and script sent to pharmacy. Daughter expresses understanding to start medication 3 days prior to surgery.

## 2019-06-11 ENCOUNTER — Ambulatory Visit: Payer: Medicare PPO | Admitting: Urology

## 2019-06-13 ENCOUNTER — Encounter
Admission: RE | Admit: 2019-06-13 | Discharge: 2019-06-13 | Disposition: A | Discharge status: Home / Self Care | Payer: Medicare PPO | Source: Ambulatory Visit | Attending: Urology | Admitting: Urology

## 2019-06-13 ENCOUNTER — Ambulatory Visit: Payer: Medicare PPO

## 2019-06-13 ENCOUNTER — Other Ambulatory Visit: Payer: Self-pay

## 2019-06-13 ENCOUNTER — Ambulatory Visit (INDEPENDENT_AMBULATORY_CARE_PROVIDER_SITE_OTHER): Payer: Medicare PPO

## 2019-06-13 ENCOUNTER — Other Ambulatory Visit: Payer: Self-pay | Admitting: Internal Medicine

## 2019-06-13 DIAGNOSIS — N189 Chronic kidney disease, unspecified: Secondary | ICD-10-CM | POA: Insufficient documentation

## 2019-06-13 DIAGNOSIS — N2889 Other specified disorders of kidney and ureter: Secondary | ICD-10-CM | POA: Diagnosis not present

## 2019-06-13 DIAGNOSIS — I2510 Atherosclerotic heart disease of native coronary artery without angina pectoris: Secondary | ICD-10-CM | POA: Diagnosis not present

## 2019-06-13 DIAGNOSIS — I4819 Other persistent atrial fibrillation: Secondary | ICD-10-CM | POA: Diagnosis not present

## 2019-06-13 DIAGNOSIS — I130 Hypertensive heart and chronic kidney disease with heart failure and stage 1 through stage 4 chronic kidney disease, or unspecified chronic kidney disease: Secondary | ICD-10-CM | POA: Insufficient documentation

## 2019-06-13 DIAGNOSIS — Z7901 Long term (current) use of anticoagulants: Secondary | ICD-10-CM | POA: Diagnosis not present

## 2019-06-13 DIAGNOSIS — K219 Gastro-esophageal reflux disease without esophagitis: Secondary | ICD-10-CM | POA: Insufficient documentation

## 2019-06-13 DIAGNOSIS — G4733 Obstructive sleep apnea (adult) (pediatric): Secondary | ICD-10-CM | POA: Diagnosis not present

## 2019-06-13 DIAGNOSIS — I5032 Chronic diastolic (congestive) heart failure: Secondary | ICD-10-CM | POA: Diagnosis not present

## 2019-06-13 DIAGNOSIS — Z1159 Encounter for screening for other viral diseases: Secondary | ICD-10-CM | POA: Insufficient documentation

## 2019-06-13 DIAGNOSIS — J4550 Severe persistent asthma, uncomplicated: Secondary | ICD-10-CM | POA: Diagnosis not present

## 2019-06-13 DIAGNOSIS — Z79899 Other long term (current) drug therapy: Secondary | ICD-10-CM | POA: Diagnosis not present

## 2019-06-13 DIAGNOSIS — Z01812 Encounter for preprocedural laboratory examination: Secondary | ICD-10-CM | POA: Insufficient documentation

## 2019-06-13 LAB — BASIC METABOLIC PANEL
Anion gap: 7 (ref 5–15)
BUN: 24 mg/dL — ABNORMAL HIGH (ref 8–23)
CO2: 28 mmol/L (ref 22–32)
Calcium: 8.5 mg/dL — ABNORMAL LOW (ref 8.9–10.3)
Chloride: 100 mmol/L (ref 98–111)
Creatinine, Ser: 0.58 mg/dL — ABNORMAL LOW (ref 0.61–1.24)
GFR calc Af Amer: >60 mL/min (ref >60)
GFR calc non Af Amer: >60 mL/min (ref >60)
Glucose, Bld: 85 mg/dL (ref 70–99)
Potassium: 4.2 mmol/L (ref 3.5–5.1)
Sodium: 135 mmol/L (ref 135–145)

## 2019-06-13 LAB — CBC
HCT: 36 % — ABNORMAL LOW (ref 39.0–52.0)
Hemoglobin: 11.8 g/dL — ABNORMAL LOW (ref 13.0–17.0)
MCH: 32.2 pg (ref 26.0–34.0)
MCHC: 32.8 g/dL (ref 30.0–36.0)
MCV: 98.1 fL (ref 80.0–100.0)
Platelets: 149 10*3/uL — ABNORMAL LOW (ref 150–400)
RBC: 3.67 MIL/uL — ABNORMAL LOW (ref 4.22–5.81)
RDW: 15.3 % (ref 11.5–15.5)
WBC: 7.1 10*3/uL (ref 4.0–10.5)
nRBC: 0 % (ref 0.0–0.2)

## 2019-06-13 LAB — TYPE AND SCREEN
ABO/RH(D): A POS
Antibody Screen: NEGATIVE

## 2019-06-13 LAB — PROTIME-INR
INR: 1.4 — ABNORMAL HIGH (ref 0.8–1.2)
Prothrombin Time: 17 seconds — ABNORMAL HIGH (ref 11.4–15.2)

## 2019-06-13 MED ORDER — OMALIZUMAB 75 MG/0.5ML ~~LOC~~ SOSY
75.0000 mg | PREFILLED_SYRINGE | Freq: Once | SUBCUTANEOUS | Status: AC
  Administered 2019-06-13: 10:00:00 75 mg via SUBCUTANEOUS

## 2019-06-13 MED ORDER — OMALIZUMAB 150 MG/ML ~~LOC~~ SOSY
300.0000 mg | PREFILLED_SYRINGE | Freq: Once | SUBCUTANEOUS | Status: AC
  Administered 2019-06-13: 300 mg via SUBCUTANEOUS

## 2019-06-13 NOTE — Progress Notes (Signed)
All questions were answered by the patient before medication was administered. Have you been hospitalized in the last 10 days? No Do you have a fever? No Do you have a cough? No Do you have a headache or sore throat? No  

## 2019-06-13 NOTE — Patient Instructions (Signed)
Your procedure is scheduled WC:HENIDP 7/20 Report to Day Surgery. To find out your arrival time please call [phone] between 1PM - 3PM on Friday 7/17.  Remember: Instructions that are not followed completely may result in serious medical risk,  up to and including death, or upon the discretion of your surgeon and anesthesiologist your  surgery may need to be rescheduled.     _X__ 1. Do not eat food after midnight the night before your procedure.                 No gum chewing or hard candies. You may drink clear liquids up to 2 hours                 before you are scheduled to arrive for your surgery- DO not drink clear                 liquids within 2 hours of the start of your surgery.                 Clear Liquids include:  water, apple juice without pulp, clear carbohydrate                 drink such as Clearfast of Gatorade, Black Coffee or Tea (Do not add                 anything to coffee or tea).  __X__2.  On the morning of surgery brush your teeth with toothpaste and water, you                may rinse your mouth with mouthwash if you wish.  Do not swallow any toothpaste of mouthwash.     _X__ 3.  No Alcohol for 24 hours before or after surgery.   _X__ 4.  Do Not Smoke or use e-cigarettes For 24 Hours Prior to Your Surgery.                 Do not use any chewable tobacco products for at least 6 hours prior to                 surgery.  ____  5.  Bring all medications with you on the day of surgery if instructed.   _x___  6.  Notify your doctor if there is any change in your medical condition      (cold, fever, infections).     Do not wear jewelry, make-up, hairpins, clips or nail polish. Do not wear lotions, powders, or perfumes. You may wear deodorant. Do not shave 48 hours prior to surgery. Men may shave face and neck. Do not bring valuables to the hospital.    [facility] is not responsible for any belongings or valuables.  Contacts,  dentures or bridgework may not be worn into surgery. Leave your suitcase in the car. After surgery it may be brought to your room. For patients admitted to the hospital, discharge time is determined by your treatment team.   Patients discharged the day of surgery will not be allowed to drive home.   Please read over the following fact sheets that you were given:     __x__ Take these medicines the morning of surgery with A SIP OF WATER:    1. gabapentin (NEURONTIN) 300 MG capsule  2. metoprolol tartrate (LOPRESSOR) 25 MG tablet  3. fluticasone (FLONASE) 50 MCG/ACT nasal spray  4.acetaminophen (TYLENOL) 500 MG tablet  5.  6.  ____ Fleet Enema (  as directed)   ____ Use CHG Soap as directed  _x___ Use inhalers on the day of surgery albuterol (PROVENTIL HFA;VENTOLIN HFA) 108 (90 Base) MCG/ACT inhaler and bring with you if in date.  ____ Stop metformin 2 days prior to surgery    ____ Take 1/2 of usual insulin dose the night before surgery. No insulin the morning          of surgery.   __x__ Stop  apixaban (ELIQUIS) 5 MG TABS tablet today  ____ Stop Anti-inflammatories on    __x__ Stop supplements until after surgery.  Already stopped  ____ Bring C-Pap to the hospital.

## 2019-06-13 NOTE — Telephone Encounter (Signed)
Last filled 05-02-19 #90 Last OV 05-09-19 Next OV 11-11-19 [facility] [address]

## 2019-06-14 LAB — SARS CORONAVIRUS 2 (TAT 6-24 HRS): SARS Coronavirus 2: NEGATIVE

## 2019-06-14 NOTE — Pre-Procedure Instructions (Signed)
Met B, CBC, PT/INR, and UA sent to Dr. Erlene Quan and Anesthesia for review.

## 2019-06-16 MED ORDER — CEFAZOLIN SODIUM-DEXTROSE 2-4 GM/100ML-% IV SOLN
2.0000 g | INTRAVENOUS | Status: AC
  Administered 2019-06-17: 2 g via INTRAVENOUS

## 2019-06-17 ENCOUNTER — Inpatient Hospital Stay: Payer: Medicare PPO | Admitting: Certified Registered Nurse Anesthetist

## 2019-06-17 ENCOUNTER — Encounter: Payer: Self-pay | Admitting: *Deleted

## 2019-06-17 ENCOUNTER — Other Ambulatory Visit: Payer: Self-pay

## 2019-06-17 ENCOUNTER — Inpatient Hospital Stay
Admission: RE | Admit: 2019-06-17 | Discharge: 2019-06-23 | DRG: 657 | Disposition: A | Discharge status: Home Health | Payer: Medicare PPO | Attending: Urology | Admitting: Urology

## 2019-06-17 ENCOUNTER — Encounter
Admission: RE | Disposition: A | Discharge status: Home Health | Payer: Self-pay | Source: Home / Self Care | Attending: Urology

## 2019-06-17 DIAGNOSIS — Z96653 Presence of artificial knee joint, bilateral: Secondary | ICD-10-CM | POA: Diagnosis present

## 2019-06-17 DIAGNOSIS — I5032 Chronic diastolic (congestive) heart failure: Secondary | ICD-10-CM | POA: Diagnosis not present

## 2019-06-17 DIAGNOSIS — D62 Acute posthemorrhagic anemia: Secondary | ICD-10-CM | POA: Diagnosis not present

## 2019-06-17 DIAGNOSIS — E860 Dehydration: Secondary | ICD-10-CM | POA: Diagnosis not present

## 2019-06-17 DIAGNOSIS — I480 Paroxysmal atrial fibrillation: Secondary | ICD-10-CM | POA: Diagnosis not present

## 2019-06-17 DIAGNOSIS — K567 Ileus, unspecified: Secondary | ICD-10-CM | POA: Diagnosis not present

## 2019-06-17 DIAGNOSIS — D4102 Neoplasm of uncertain behavior of left kidney: Secondary | ICD-10-CM | POA: Diagnosis not present

## 2019-06-17 DIAGNOSIS — R1110 Vomiting, unspecified: Secondary | ICD-10-CM

## 2019-06-17 DIAGNOSIS — Z8249 Family history of ischemic heart disease and other diseases of the circulatory system: Secondary | ICD-10-CM

## 2019-06-17 DIAGNOSIS — F418 Other specified anxiety disorders: Secondary | ICD-10-CM | POA: Diagnosis not present

## 2019-06-17 DIAGNOSIS — G4730 Sleep apnea, unspecified: Secondary | ICD-10-CM | POA: Diagnosis not present

## 2019-06-17 DIAGNOSIS — M069 Rheumatoid arthritis, unspecified: Secondary | ICD-10-CM | POA: Diagnosis present

## 2019-06-17 DIAGNOSIS — K56609 Unspecified intestinal obstruction, unspecified as to partial versus complete obstruction: Secondary | ICD-10-CM | POA: Diagnosis not present

## 2019-06-17 DIAGNOSIS — Z7901 Long term (current) use of anticoagulants: Secondary | ICD-10-CM

## 2019-06-17 DIAGNOSIS — K219 Gastro-esophageal reflux disease without esophagitis: Secondary | ICD-10-CM | POA: Diagnosis not present

## 2019-06-17 DIAGNOSIS — N2889 Other specified disorders of kidney and ureter: Secondary | ICD-10-CM | POA: Diagnosis not present

## 2019-06-17 DIAGNOSIS — R000 Tachycardia, unspecified: Secondary | ICD-10-CM | POA: Diagnosis not present

## 2019-06-17 DIAGNOSIS — I509 Heart failure, unspecified: Secondary | ICD-10-CM | POA: Diagnosis not present

## 2019-06-17 DIAGNOSIS — Z808 Family history of malignant neoplasm of other organs or systems: Secondary | ICD-10-CM

## 2019-06-17 DIAGNOSIS — I110 Hypertensive heart disease with heart failure: Secondary | ICD-10-CM | POA: Diagnosis present

## 2019-06-17 DIAGNOSIS — N319 Neuromuscular dysfunction of bladder, unspecified: Secondary | ICD-10-CM | POA: Diagnosis not present

## 2019-06-17 DIAGNOSIS — I130 Hypertensive heart and chronic kidney disease with heart failure and stage 1 through stage 4 chronic kidney disease, or unspecified chronic kidney disease: Secondary | ICD-10-CM | POA: Diagnosis not present

## 2019-06-17 DIAGNOSIS — Z8546 Personal history of malignant neoplasm of prostate: Secondary | ICD-10-CM | POA: Diagnosis not present

## 2019-06-17 DIAGNOSIS — C641 Malignant neoplasm of right kidney, except renal pelvis: Secondary | ICD-10-CM | POA: Diagnosis not present

## 2019-06-17 DIAGNOSIS — I2510 Atherosclerotic heart disease of native coronary artery without angina pectoris: Secondary | ICD-10-CM | POA: Diagnosis present

## 2019-06-17 DIAGNOSIS — N189 Chronic kidney disease, unspecified: Secondary | ICD-10-CM | POA: Diagnosis not present

## 2019-06-17 DIAGNOSIS — I951 Orthostatic hypotension: Secondary | ICD-10-CM | POA: Diagnosis not present

## 2019-06-17 DIAGNOSIS — G4733 Obstructive sleep apnea (adult) (pediatric): Secondary | ICD-10-CM | POA: Diagnosis present

## 2019-06-17 HISTORY — PX: LAPAROSCOPIC NEPHRECTOMY, HAND ASSISTED: SHX1929

## 2019-06-17 LAB — GLUCOSE, CAPILLARY: Glucose-Capillary: 139 mg/dL — ABNORMAL HIGH (ref 70–99)

## 2019-06-17 SURGERY — NEPHRECTOMY, HAND-ASSISTED, LAPAROSCOPIC
Anesthesia: General | Site: Abdomen | Laterality: Right

## 2019-06-17 MED ORDER — FENTANYL CITRATE (PF) 100 MCG/2ML IJ SOLN
INTRAMUSCULAR | Status: DC | PRN
  Administered 2019-06-17 (×4): 50 ug via INTRAVENOUS

## 2019-06-17 MED ORDER — DEXAMETHASONE SODIUM PHOSPHATE 10 MG/ML IJ SOLN
INTRAMUSCULAR | Status: AC
  Filled 2019-06-17: qty 1

## 2019-06-17 MED ORDER — FUROSEMIDE 20 MG PO TABS
20.0000 mg | ORAL_TABLET | Freq: Every day | ORAL | Status: DC
  Administered 2019-06-18 – 2019-06-23 (×4): 20 mg via ORAL
  Filled 2019-06-17 (×9): qty 1

## 2019-06-17 MED ORDER — POLYETHYLENE GLYCOL 3350 17 GM/SCOOP PO POWD
17.0000 g | Freq: Every day | ORAL | Status: DC
  Filled 2019-06-17: qty 255

## 2019-06-17 MED ORDER — SODIUM CHLORIDE 1 G PO TABS
1.0000 g | ORAL_TABLET | Freq: Two times a day (BID) | ORAL | Status: DC
  Administered 2019-06-17 – 2019-06-23 (×9): 1 g via ORAL
  Filled 2019-06-17 (×13): qty 1

## 2019-06-17 MED ORDER — BUPIVACAINE LIPOSOME 1.3 % IJ SUSP
INTRAMUSCULAR | Status: DC | PRN
  Administered 2019-06-17: 10:00:00 50 mL

## 2019-06-17 MED ORDER — BUPIVACAINE LIPOSOME 1.3 % IJ SUSP
INTRAMUSCULAR | Status: AC
  Filled 2019-06-17: qty 20

## 2019-06-17 MED ORDER — DOCUSATE SODIUM 100 MG PO CAPS
100.0000 mg | ORAL_CAPSULE | Freq: Two times a day (BID) | ORAL | Status: DC
  Administered 2019-06-18 – 2019-06-23 (×10): 100 mg via ORAL
  Filled 2019-06-17 (×11): qty 1

## 2019-06-17 MED ORDER — SUGAMMADEX SODIUM 200 MG/2ML IV SOLN
INTRAVENOUS | Status: DC | PRN
  Administered 2019-06-17: 200 mg via INTRAVENOUS

## 2019-06-17 MED ORDER — ALPRAZOLAM 0.5 MG PO TABS
0.5000 mg | ORAL_TABLET | Freq: Three times a day (TID) | ORAL | Status: DC | PRN
  Administered 2019-06-17 – 2019-06-18 (×2): 0.5 mg via ORAL
  Filled 2019-06-17 (×3): qty 1

## 2019-06-17 MED ORDER — ACETAMINOPHEN 10 MG/ML IV SOLN
INTRAVENOUS | Status: DC | PRN
  Administered 2019-06-17: 1000 mg via INTRAVENOUS

## 2019-06-17 MED ORDER — HEMOSTATIC AGENTS (NO CHARGE) OPTIME
TOPICAL | Status: DC | PRN
  Administered 2019-06-17: 1 via TOPICAL

## 2019-06-17 MED ORDER — DIPHENHYDRAMINE HCL 50 MG/ML IJ SOLN
12.5000 mg | Freq: Four times a day (QID) | INTRAMUSCULAR | Status: DC | PRN
  Administered 2019-06-20: 12.5 mg via INTRAVENOUS
  Filled 2019-06-17: qty 1

## 2019-06-17 MED ORDER — THROMBIN 5000 UNITS EX SOLR
CUTANEOUS | Status: AC
  Filled 2019-06-17: qty 5000

## 2019-06-17 MED ORDER — FENTANYL CITRATE (PF) 100 MCG/2ML IJ SOLN
25.0000 ug | INTRAMUSCULAR | Status: AC | PRN
  Administered 2019-06-17 (×6): 25 ug via INTRAVENOUS

## 2019-06-17 MED ORDER — BELLADONNA ALKALOIDS-OPIUM 16.2-60 MG RE SUPP: 1.0000 | Freq: Four times a day (QID) | RECTAL | Status: DC | PRN

## 2019-06-17 MED ORDER — FENTANYL CITRATE (PF) 100 MCG/2ML IJ SOLN
INTRAMUSCULAR | Status: AC
  Filled 2019-06-17: qty 2

## 2019-06-17 MED ORDER — GABAPENTIN 300 MG PO CAPS
300.0000 mg | ORAL_CAPSULE | Freq: Three times a day (TID) | ORAL | Status: DC
  Administered 2019-06-17 – 2019-06-23 (×15): 300 mg via ORAL
  Filled 2019-06-17 (×16): qty 1

## 2019-06-17 MED ORDER — SELENIUM 200 MCG PO CAPS: 200.0000 ug | ORAL_CAPSULE | Freq: Every day | ORAL | Status: DC

## 2019-06-17 MED ORDER — CEFAZOLIN SODIUM-DEXTROSE 1-4 GM/50ML-% IV SOLN
1.0000 g | Freq: Three times a day (TID) | INTRAVENOUS | Status: AC
  Administered 2019-06-17 (×2): 1 g via INTRAVENOUS
  Filled 2019-06-17 (×3): qty 50

## 2019-06-17 MED ORDER — PROPOFOL 10 MG/ML IV BOLUS
INTRAVENOUS | Status: AC
  Filled 2019-06-17: qty 20

## 2019-06-17 MED ORDER — LIDOCAINE HCL (PF) 2 % IJ SOLN
INTRAMUSCULAR | Status: AC
  Filled 2019-06-17: qty 10

## 2019-06-17 MED ORDER — FAMOTIDINE 20 MG PO TABS
ORAL_TABLET | ORAL | Status: AC
  Administered 2019-06-17: 07:00:00 20 mg via ORAL
  Filled 2019-06-17: qty 1

## 2019-06-17 MED ORDER — METOPROLOL TARTRATE 25 MG PO TABS
25.0000 mg | ORAL_TABLET | Freq: Two times a day (BID) | ORAL | Status: DC
  Administered 2019-06-17 – 2019-06-22 (×5): 25 mg via ORAL
  Filled 2019-06-17 (×13): qty 1

## 2019-06-17 MED ORDER — ONDANSETRON HCL 4 MG/2ML IJ SOLN
INTRAMUSCULAR | Status: AC
  Filled 2019-06-17: qty 2

## 2019-06-17 MED ORDER — DEXAMETHASONE SODIUM PHOSPHATE 10 MG/ML IJ SOLN
INTRAMUSCULAR | Status: DC | PRN
  Administered 2019-06-17: 5 mg via INTRAVENOUS

## 2019-06-17 MED ORDER — POLYETHYLENE GLYCOL 3350 17 G PO PACK
17.0000 g | PACK | Freq: Every day | ORAL | Status: DC
  Administered 2019-06-17 – 2019-06-19 (×3): 17 g via ORAL
  Filled 2019-06-17 (×5): qty 1

## 2019-06-17 MED ORDER — MIRABEGRON ER 50 MG PO TB24
50.0000 mg | ORAL_TABLET | Freq: Every day | ORAL | Status: DC
  Administered 2019-06-17 – 2019-06-23 (×7): 50 mg via ORAL
  Filled 2019-06-17 (×7): qty 1

## 2019-06-17 MED ORDER — FENTANYL CITRATE (PF) 100 MCG/2ML IJ SOLN
INTRAMUSCULAR | Status: AC
  Administered 2019-06-17: 25 ug via INTRAVENOUS
  Filled 2019-06-17: qty 2

## 2019-06-17 MED ORDER — PROPOFOL 10 MG/ML IV BOLUS
INTRAVENOUS | Status: DC | PRN
  Administered 2019-06-17: 120 mg via INTRAVENOUS
  Administered 2019-06-17: 50 mg via INTRAVENOUS

## 2019-06-17 MED ORDER — BUPIVACAINE HCL (PF) 0.5 % IJ SOLN
INTRAMUSCULAR | Status: AC
  Filled 2019-06-17: qty 30

## 2019-06-17 MED ORDER — ONDANSETRON HCL 4 MG/2ML IJ SOLN
INTRAMUSCULAR | Status: DC | PRN
  Administered 2019-06-17: 4 mg via INTRAVENOUS

## 2019-06-17 MED ORDER — FENTANYL CITRATE (PF) 100 MCG/2ML IJ SOLN
25.0000 ug | INTRAMUSCULAR | Status: AC | PRN
  Administered 2019-06-17 (×2): 25 ug via INTRAVENOUS

## 2019-06-17 MED ORDER — PROMETHAZINE HCL 25 MG/ML IJ SOLN
6.2500 mg | Freq: Once | INTRAMUSCULAR | Status: AC
  Administered 2019-06-17: 6.25 mg via INTRAVENOUS

## 2019-06-17 MED ORDER — MIDAZOLAM HCL 2 MG/2ML IJ SOLN
INTRAMUSCULAR | Status: AC
  Filled 2019-06-17: qty 2

## 2019-06-17 MED ORDER — FAMOTIDINE 20 MG PO TABS
20.0000 mg | ORAL_TABLET | Freq: Once | ORAL | Status: AC
  Administered 2019-06-17: 07:00:00 20 mg via ORAL

## 2019-06-17 MED ORDER — PHENYLEPHRINE HCL (PRESSORS) 10 MG/ML IV SOLN
INTRAVENOUS | Status: DC | PRN
  Administered 2019-06-17: 100 ug via INTRAVENOUS

## 2019-06-17 MED ORDER — PROMETHAZINE HCL 25 MG/ML IJ SOLN
INTRAMUSCULAR | Status: AC
  Filled 2019-06-17: qty 1

## 2019-06-17 MED ORDER — CEFAZOLIN SODIUM-DEXTROSE 2-4 GM/100ML-% IV SOLN
INTRAVENOUS | Status: AC
  Filled 2019-06-17: qty 100

## 2019-06-17 MED ORDER — LACTATED RINGERS IV SOLN
INTRAVENOUS | Status: DC
  Administered 2019-06-17 (×2): via INTRAVENOUS

## 2019-06-17 MED ORDER — EPHEDRINE SULFATE 50 MG/ML IJ SOLN
INTRAMUSCULAR | Status: DC | PRN
  Administered 2019-06-17: 10 mg via INTRAVENOUS

## 2019-06-17 MED ORDER — SODIUM CHLORIDE 0.9 % IV SOLN
INTRAVENOUS | Status: DC
  Administered 2019-06-17 – 2019-06-18 (×2): via INTRAVENOUS

## 2019-06-17 MED ORDER — FLUTICASONE PROPIONATE 50 MCG/ACT NA SUSP
2.0000 | Freq: Two times a day (BID) | NASAL | Status: DC
  Administered 2019-06-17 – 2019-06-23 (×11): 2 via NASAL
  Filled 2019-06-17: qty 16

## 2019-06-17 MED ORDER — ROCURONIUM BROMIDE 50 MG/5ML IV SOLN
INTRAVENOUS | Status: AC
  Filled 2019-06-17: qty 1

## 2019-06-17 MED ORDER — MORPHINE SULFATE (PF) 2 MG/ML IV SOLN: 2.0000 mg | INTRAVENOUS | Status: DC | PRN

## 2019-06-17 MED ORDER — HEPARIN SODIUM (PORCINE) 5000 UNIT/ML IJ SOLN
5000.0000 [IU] | Freq: Three times a day (TID) | INTRAMUSCULAR | Status: DC
  Administered 2019-06-18 – 2019-06-20 (×7): 5000 [IU] via SUBCUTANEOUS
  Filled 2019-06-17 (×7): qty 1

## 2019-06-17 MED ORDER — THROMBIN 5000 UNITS EX SOLR
CUTANEOUS | Status: DC | PRN
  Administered 2019-06-17: 5000 [IU] via TOPICAL

## 2019-06-17 MED ORDER — FENTANYL CITRATE (PF) 100 MCG/2ML IJ SOLN: 12.5000 ug | INTRAMUSCULAR | Status: DC | PRN

## 2019-06-17 MED ORDER — ALBUTEROL SULFATE (2.5 MG/3ML) 0.083% IN NEBU: 2.5000 mg | INHALATION_SOLUTION | Freq: Four times a day (QID) | RESPIRATORY_TRACT | Status: DC | PRN

## 2019-06-17 MED ORDER — SODIUM CHLORIDE FLUSH 0.9 % IV SOLN
INTRAVENOUS | Status: AC
  Filled 2019-06-17: qty 10

## 2019-06-17 MED ORDER — RISAQUAD PO CAPS
1.0000 | ORAL_CAPSULE | Freq: Every day | ORAL | Status: DC
  Administered 2019-06-17 – 2019-06-23 (×7): 1 via ORAL
  Filled 2019-06-17 (×7): qty 1

## 2019-06-17 MED ORDER — PRAVASTATIN SODIUM 20 MG PO TABS
20.0000 mg | ORAL_TABLET | Freq: Every day | ORAL | Status: DC
  Administered 2019-06-17 – 2019-06-23 (×7): 20 mg via ORAL
  Filled 2019-06-17 (×7): qty 1

## 2019-06-17 MED ORDER — ONDANSETRON HCL 4 MG/2ML IJ SOLN
4.0000 mg | INTRAMUSCULAR | Status: DC | PRN
  Administered 2019-06-20 – 2019-06-21 (×4): 4 mg via INTRAVENOUS
  Filled 2019-06-17 (×4): qty 2

## 2019-06-17 MED ORDER — SODIUM CHLORIDE 0.9 % IV SOLN
INTRAVENOUS | Status: DC | PRN
  Administered 2019-06-17: 08:00:00 30 ug/min via INTRAVENOUS

## 2019-06-17 MED ORDER — LIDOCAINE HCL (CARDIAC) PF 100 MG/5ML IV SOSY
PREFILLED_SYRINGE | INTRAVENOUS | Status: DC | PRN
  Administered 2019-06-17: 100 mg via INTRAVENOUS

## 2019-06-17 MED ORDER — ACETAMINOPHEN 10 MG/ML IV SOLN
INTRAVENOUS | Status: AC
  Filled 2019-06-17: qty 100

## 2019-06-17 MED ORDER — DIPHENHYDRAMINE HCL 12.5 MG/5ML PO ELIX
12.5000 mg | ORAL_SOLUTION | Freq: Four times a day (QID) | ORAL | Status: DC | PRN
  Filled 2019-06-17: qty 5

## 2019-06-17 MED ORDER — ROCURONIUM BROMIDE 100 MG/10ML IV SOLN
INTRAVENOUS | Status: DC | PRN
  Administered 2019-06-17: 20 mg via INTRAVENOUS
  Administered 2019-06-17: 50 mg via INTRAVENOUS
  Administered 2019-06-17 (×2): 20 mg via INTRAVENOUS

## 2019-06-17 MED ORDER — SEVOFLURANE IN SOLN
RESPIRATORY_TRACT | Status: AC
  Filled 2019-06-17: qty 250

## 2019-06-17 MED ORDER — OXYCODONE-ACETAMINOPHEN 5-325 MG PO TABS
1.0000 | ORAL_TABLET | ORAL | Status: DC | PRN
  Administered 2019-06-17 (×2): 2 via ORAL
  Administered 2019-06-18 – 2019-06-23 (×5): 1 via ORAL
  Filled 2019-06-17 (×2): qty 1
  Filled 2019-06-17: qty 2
  Filled 2019-06-17 (×2): qty 1
  Filled 2019-06-17 (×2): qty 2
  Filled 2019-06-17: qty 1

## 2019-06-17 MED ORDER — SUCCINYLCHOLINE CHLORIDE 20 MG/ML IJ SOLN
INTRAMUSCULAR | Status: AC
  Filled 2019-06-17: qty 1

## 2019-06-17 MED ORDER — ACETAMINOPHEN 325 MG PO TABS
650.0000 mg | ORAL_TABLET | ORAL | Status: DC | PRN
  Administered 2019-06-19: 650 mg via ORAL
  Filled 2019-06-17: qty 2

## 2019-06-17 SURGICAL SUPPLY — 95 items
ADH SKN CLS APL DERMABOND .7 (GAUZE/BANDAGES/DRESSINGS) ×1
AGENT HMST MTR 8 SURGIFLO (HEMOSTASIS) ×1
ANCHOR TIS RET SYS 1550ML (BAG) ×2 IMPLANT
APL ESCP 34 STRL LF DISP (HEMOSTASIS) ×1
APL PRP STRL LF DISP 70% ISPRP (MISCELLANEOUS) ×1
APPLICATOR SURGIFLO ENDO (HEMOSTASIS) ×2 IMPLANT
APPLIER CLIP ROT 10 11.4 M/L (STAPLE)
APPLIER CLIP ROT 13.4 12 LRG (CLIP)
APR CLP LRG 13.4X12 ROT 20 MLT (CLIP)
APR CLP MED LRG 11.4X10 (STAPLE)
BAG SPEC RTRVL C1550 25.4 (BAG) ×1
CANISTER SUCT 1200ML W/VALVE (MISCELLANEOUS) ×3 IMPLANT
CHLORAPREP W/TINT 26 (MISCELLANEOUS) ×3 IMPLANT
CLEANER CAUTERY TIP 5X5 PAD (MISCELLANEOUS) ×1 IMPLANT
CLIP APPLIE ROT 10 11.4 M/L (STAPLE) IMPLANT
CLIP APPLIE ROT 13.4 12 LRG (CLIP) IMPLANT
CLIP VESOLOCK LG 6/CT PURPLE (CLIP) ×3 IMPLANT
CLOSURE WOUND 1/2 X4 (GAUZE/BANDAGES/DRESSINGS)
COVER WAND RF STERILE (DRAPES) ×3 IMPLANT
CUTTER ECHEON FLEX ENDO 45 340 (ENDOMECHANICALS) IMPLANT
DEFOGGER SCOPE WARMER CLEARIFY (MISCELLANEOUS) ×3 IMPLANT
DERMABOND ADVANCED (GAUZE/BANDAGES/DRESSINGS) ×2
DERMABOND ADVANCED .7 DNX12 (GAUZE/BANDAGES/DRESSINGS) ×1 IMPLANT
DISSECTOR KITTNER STICK (MISCELLANEOUS) ×1 IMPLANT
DISSECTORS/KITTNER STICK (MISCELLANEOUS)
DRAPE INCISE IOBAN 66X45 STRL (DRAPES) ×3 IMPLANT
DRAPE STERI POUCH LG 24X46 STR (DRAPES) ×3 IMPLANT
DRAPE SURG 17X11 SM STRL (DRAPES) ×12 IMPLANT
DRSG TEGADERM 2-3/8X2-3/4 SM (GAUZE/BANDAGES/DRESSINGS) IMPLANT
DRSG TEGADERM 4X4.75 (GAUZE/BANDAGES/DRESSINGS) IMPLANT
DRSG TELFA 3X8 NADH (GAUZE/BANDAGES/DRESSINGS) IMPLANT
ELECT REM PT RETURN 9FT ADLT (ELECTROSURGICAL) ×3
ELECTRODE REM PT RTRN 9FT ADLT (ELECTROSURGICAL) ×1 IMPLANT
GLOVE BIO SURGEON STRL SZ 6.5 (GLOVE) ×6 IMPLANT
GLOVE BIO SURGEONS STRL SZ 6.5 (GLOVE) ×4
GLOVE INDICATOR 6.5 STRL GRN (GLOVE) ×3 IMPLANT
GOWN STRL REUS W/ TWL LRG LVL3 (GOWN DISPOSABLE) ×3 IMPLANT
GOWN STRL REUS W/TWL LRG LVL3 (GOWN DISPOSABLE) ×9
GRASPER SUT TROCAR 14GX15 (MISCELLANEOUS) ×3 IMPLANT
HANDLE YANKAUER SUCT BULB TIP (MISCELLANEOUS) ×3 IMPLANT
HEMOSTAT SURGICEL 2X14 (HEMOSTASIS) ×2 IMPLANT
HOLDER FOLEY CATH W/STRAP (MISCELLANEOUS) ×3 IMPLANT
IRRIGATION STRYKERFLOW (MISCELLANEOUS) ×1 IMPLANT
IRRIGATOR STRYKERFLOW (MISCELLANEOUS) ×3
IV NS 1000ML (IV SOLUTION) ×3
IV NS 1000ML BAXH (IV SOLUTION) ×1 IMPLANT
KIT PINK PAD W/HEAD ARE REST (MISCELLANEOUS) ×3
KIT PINK PAD W/HEAD ARM REST (MISCELLANEOUS) ×1 IMPLANT
KIT TURNOVER KIT A (KITS) ×3 IMPLANT
L-HOOK LAP DISP 36CM (ELECTROSURGICAL) ×3
LABEL OR SOLS (LABEL) ×3 IMPLANT
LHOOK LAP DISP 36CM (ELECTROSURGICAL) ×1 IMPLANT
LIGASURE LAP ATLAS 10MM 37CM (INSTRUMENTS) ×3 IMPLANT
LOOP RED MAXI  1X406MM (MISCELLANEOUS) ×2
LOOP VESSEL MAXI 1X406 RED (MISCELLANEOUS) ×1 IMPLANT
NDL HYPO 21X1.5 SAFETY (NEEDLE) ×1 IMPLANT
NDL INSUFFLATION 14GA 120MM (NEEDLE) ×1 IMPLANT
NEEDLE HYPO 21X1.5 SAFETY (NEEDLE) ×3 IMPLANT
NEEDLE INSUFFLATION 14GA 120MM (NEEDLE) ×3 IMPLANT
PACK LAP CHOLECYSTECTOMY (MISCELLANEOUS) ×3 IMPLANT
PAD CLEANER CAUTERY TIP 5X5 (MISCELLANEOUS) ×2
PAD DRESSING TELFA 3X8 NADH (GAUZE/BANDAGES/DRESSINGS) IMPLANT
PENCIL ELECTRO HAND CTR (MISCELLANEOUS) ×5 IMPLANT
RELOAD STAPLE 35X2.5 WHT THIN (STAPLE) IMPLANT
RELOAD STAPLE 60 2.6 WHT THN (STAPLE) IMPLANT
RELOAD STAPLER WHITE 60MM (STAPLE) ×2 IMPLANT
RELOAD WH ECHELON 45 (STAPLE) IMPLANT
SCISSORS METZENBAUM CVD 33 (INSTRUMENTS) ×3 IMPLANT
SET TUBE SMOKE EVAC HIGH FLOW (TUBING) ×3 IMPLANT
SPOGE SURGIFLO 8M (HEMOSTASIS) ×2
SPONGE LAP 18X18 RF (DISPOSABLE) ×3 IMPLANT
SPONGE SURGIFLO 8M (HEMOSTASIS) IMPLANT
STAPLE ECHEON FLEX 60 POW ENDO (STAPLE) ×2 IMPLANT
STAPLE RELOAD 2.5MM WHITE (STAPLE) IMPLANT
STAPLER RELOAD WHITE 60MM (STAPLE) ×6
STAPLER SKIN PROX 35W (STAPLE) ×3 IMPLANT
STAPLER VASCULAR ECHELON 35 (CUTTER) IMPLANT
STRIP CLOSURE SKIN 1/2X4 (GAUZE/BANDAGES/DRESSINGS) IMPLANT
SUT CHROMIC 0 CT 1 (SUTURE) IMPLANT
SUT MNCRL AB 4-0 PS2 18 (SUTURE) ×6 IMPLANT
SUT PDS AB 1 CT1 36 (SUTURE) ×12 IMPLANT
SUT PDS AB 1 TP1 96 (SUTURE) ×2 IMPLANT
SUT VIC AB 0 CT1 36 (SUTURE) ×6 IMPLANT
SUT VIC AB 1 CT1 36 (SUTURE) IMPLANT
SUT VIC AB 2-0 SH 27 (SUTURE)
SUT VIC AB 2-0 SH 27XBRD (SUTURE) IMPLANT
SUT VIC AB 2-0 UR6 27 (SUTURE) IMPLANT
SUT VIC AB 4-0 FS2 27 (SUTURE) ×3 IMPLANT
SUT VICRYL 0 AB UR-6 (SUTURE) ×3 IMPLANT
SYS LAPSCP GELPORT 120MM (MISCELLANEOUS) ×3
SYSTEM LAPSCP GELPORT 120MM (MISCELLANEOUS) ×1 IMPLANT
TRAY FOLEY MTR SLVR 16FR STAT (SET/KITS/TRAYS/PACK) ×3 IMPLANT
TROCAR ENDOPATH XCEL 12X100 BL (ENDOMECHANICALS) ×3 IMPLANT
TROCAR XCEL 12X100 BLDLESS (ENDOMECHANICALS) ×3 IMPLANT
TROCAR XCEL NON-BLD 5MMX100MML (ENDOMECHANICALS) ×3 IMPLANT

## 2019-06-17 NOTE — Anesthesia Procedure Notes (Signed)
Procedure Name: Intubation Date/Time: 06/17/2019 7:53 AM Performed by: Caryl Asp, CRNA Pre-anesthesia Checklist: Patient identified, Patient being monitored, Timeout performed, Emergency Drugs available and Suction available Patient Re-evaluated:Patient Re-evaluated prior to induction Oxygen Delivery Method: Circle system utilized Preoxygenation: Pre-oxygenation with 100% oxygen Induction Type: IV induction Ventilation: Mask ventilation without difficulty Laryngoscope Size: McGraph and 4 Grade View: Grade I Tube type: Oral Tube size: 7.5 mm Number of attempts: 1 Airway Equipment and Method: Stylet Placement Confirmation: ETT inserted through vocal cords under direct vision,  positive ETCO2 and breath sounds checked- equal and bilateral Secured at: 23 cm Tube secured with: Tape Dental Injury: Teeth and Oropharynx as per pre-operative assessment

## 2019-06-17 NOTE — Progress Notes (Signed)
Pt states that he has arthritis   And shoulder is hard to move

## 2019-06-17 NOTE — Anesthesia Preprocedure Evaluation (Signed)
Anesthesia Evaluation  Patient identified by MRN, date of birth, ID band Patient awake    Reviewed: Allergy & Precautions, NPO status , Patient's Chart, lab work & pertinent test results  History of Anesthesia Complications Negative for: history of anesthetic complications  Airway Mallampati: II  TM Distance: >3 FB Neck ROM: Full    Dental  (+) Poor Dentition   Pulmonary asthma , sleep apnea ,    breath sounds clear to auscultation- rhonchi (-) wheezing      Cardiovascular hypertension, Pt. on medications + CAD and +CHF (preserved EF)  (-) Cardiac Stents and (-) CABG + dysrhythmias (s/p cardioversion) Atrial Fibrillation  Rhythm:Regular Rate:Normal - Systolic murmurs and - Diastolic murmurs Echo 6/44/03:  1. The left ventricle has normal systolic function with an ejection fraction of 60-65%. The cavity size was normal. There is mildly increased left ventricular wall thickness. Left ventricular diastolic function could not be evaluated secondary to atrial  fibrillation. No evidence of left ventricular regional wall motion abnormalities.  2. The right ventricle has mildly reduced systolic function. The cavity was moderately enlarged. There is no increase in right ventricular wall thickness. Right ventricular systolic pressure is moderately elevated with an estimated pressure of 45 mmHg.  3. Left atrial size was mildly dilated.  4. Right atrial size was moderately dilated.  5. Mildly thickened tricuspid valve leaflets.  6. The mitral valve is degenerative. Moderate thickening of the mitral valve leaflet.  7. The tricuspid valve is degenerative. Tricuspid valve regurgitation is mild-moderate.  8. The aortic valve is tricuspid. Moderate thickening of the aortic valve. Moderate calcification of the aortic valve. Aortic valve regurgitation is trivial by color flow Doppler. Mild-moderate stenosis of the aortic valve.  9. The aortic root is  normal in size and structure. 10. The inferior vena cava was dilated in size with <50% respiratory variability.   Neuro/Psych PSYCHIATRIC DISORDERS Anxiety Depression TIA   GI/Hepatic Neg liver ROS, GERD  ,  Endo/Other  negative endocrine ROSneg diabetes  Renal/GU Renal disease     Musculoskeletal  (+) Arthritis ,   Abdominal (+) - obese,   Peds  Hematology  (+) anemia ,   Anesthesia Other Findings Past Medical History: No date: (HFpEF) heart failure with preserved ejection fraction ([place])     Comment:  a. 02/2019 Echo: Ef 60-65%, mildly reduced RV fxn. RVSP               80mmHg. Mildly dil LA. Mod dil RA. Mild to mod TR. Mild               to mod AS. Triv AI. No date: Allergy No date: Anemia No date: Anxiety No date: Arthritis     Comment:  rheumatoid No date: Asthma No date: Basal cell carcinoma     Comment:  back No date: CAD (coronary artery disease) No date: CHF (congestive heart failure) (HCC) No date: Chronic kidney disease     Comment:  Mass right kidney No date: Chronic sinusitis No date: Collagen vascular disease (HCC)     Comment:  Rheumatoid Arthritis No date: Depression No date: Diverticulitis No date: Dysrhythmia No date: ED (erectile dysfunction) No date: GERD (gastroesophageal reflux disease) No date: History of SIADH No date: Hx of adenomatous colonic polyps No date: Hypertension No date: Hyponatremia No date: IBS (irritable bowel syndrome) No date: Neurodermatitis No date: Neurogenic bladder No date: OSA (obstructive sleep apnea)     Comment:  no CPAP since weight loss No date: Persistent  atrial fibrillation     Comment:  a. Dx 02/2019; b. CHA2DS2VASc = 6-->eliquis initiated. No date: Prostate cancer ([place]) No date: Renal mass     Comment:  10/2018 4.2cm R renal cortical mass. Most compatible w/               clear cell renal cell carcinoma.   Reproductive/Obstetrics                             Anesthesia  Physical Anesthesia Plan  ASA: III  Anesthesia Plan: General   Post-op Pain Management:    Induction: Intravenous  PONV Risk Score and Plan: 1 and Ondansetron and Dexamethasone  Airway Management Planned: Oral ETT  Additional Equipment:   Intra-op Plan:   Post-operative Plan: Extubation in OR  Informed Consent: I have reviewed the patients History and Physical, chart, labs and discussed the procedure including the risks, benefits and alternatives for the proposed anesthesia with the patient or authorized representative who has indicated his/her understanding and acceptance.     Dental advisory given  Plan Discussed with: CRNA and Anesthesiologist  Anesthesia Plan Comments:         Anesthesia Quick Evaluation

## 2019-06-17 NOTE — Anesthesia Post-op Follow-up Note (Signed)
Anesthesia QCDR form completed.        

## 2019-06-17 NOTE — Transfer of Care (Signed)
Immediate Anesthesia Transfer of Care Note  Patient: [name]  Procedure(s) Performed: HAND ASSISTED LAPAROSCOPIC NEPHRECTOMY (Right Abdomen)  Patient Location: PACU  Anesthesia Type:General  Level of Consciousness: awake and alert   Airway & Oxygen Therapy: Patient Spontanous Breathing and Patient connected to face mask oxygen  Post-op Assessment: Report given to RN, Post -op Vital signs reviewed and stable and Patient moving all extremities X 4  Post vital signs: Reviewed and stable  Last Vitals:  Vitals Value Taken Time  BP    Temp    Pulse 64 06/17/19 1102  Resp 18 06/17/19 1102  SpO2 100 % 06/17/19 1102  Vitals shown include unvalidated device data.  Last Pain:  Vitals:   06/17/19 8264  TempSrc: Tympanic  PainSc: 5          Complications: No apparent anesthesia complications

## 2019-06-17 NOTE — Op Note (Signed)
PREOP DIAGNOSIS: Left renal mass  POSTOPERATIVE DIAGNOSIS: Left renal mass  OPERATION PERFORMED: Hand-assisted laparoscopic right radical nephrectomy.   SURGEON: Hollice Espy, MD   Assistant:  Nickolas Madrid, MD   ANESTHESIA: General.   ESTIMATED BLOOD LOSS: 100  cc   DRAINS: 16-French Foley catheter.   COMPLICATIONS: None.  Indications: Endophytic right lower pole renal mass  FINDINGS: Hilar anatomy: Arteries: 1 Veins: 1 Ureters: 1   DESCRIPTION OF OPERATION: Informed consent was obtained. The patient was marked on the right side. IV antibiotics were given for bacterial prophylaxis on call to the operating room. SCDs were provided for DVT prophylaxis. The patient was taken to the operating room and placed supine on the operating table. General anesthesia was provided. A Foley catheter was placed to drain the bladder.  The patient was positioned in right lateral decubitus with the left flank elevated about 70 degrees and the table flexed slightly. The left arm was placed in a padded airplane for support. Axillary roll was positioned. The patient was secured to the table with soft straps and then prepped and draped sterilely.  We had a time-out confirming the patient identification, planned procedure, surgical site, and all present were in agreement. All present were in agreement.  A 8 cm incision was made for the hand port in the right lower quadrant. The anterior fascia was incised and elevated. The rectus belly was retracted medially and the peritoneum was incised. An incisional block was provided with liposomal Marcaine. The GelPort was assembled. A 12 mm trocar was placed above the umbilicus, and then the abdomen was insufflated. Laparoscopic survey revealed no abnormalities or injuries. An additional 12 mm trocar was just below  the subxiphoid. All port sites were then infiltrated with liposomal Marcaine. Zero Vicryls were placed at the 12 mm  trocar sites with the Carter-Thomason device for closure at the end of the case.   The white line of Toldt was incised. The colon was reflected from the spleen to the pelvis. Gerota's fascia was lifted up off the lower pole of the kidney and the ureter and gonadal vessels were identified. The gonadal vein was exposed and followed up to the  vena cava.  The duodenum was identified and kocherized medially with extreme care to avoid any injury.  The upper pole of the kidney was mobilized off of the quadratus lumborum and further mobilized away from the liver.The lower pole and lateral attachments were freed. The kidney was held laterally and the hilar dissection was completed.  The hilum was then dissected en bloc with renal and artery together.  A 60 mm vascular load stapler was used to take the hilum which was hemostatic.  Some additional Weck clips were placed on the stay side.    The ureter was exposed, clipped distally using 12 mm clips, and divided sharply.  The ureter stump was confirmed hemostatic.  Identified what I thought initially was the adrenal gland and try to create a plane with the monopolar hook but quickly realized this was into the upper pole parenchyma and then quickly reoriented.  Ultimately, ended up taking the upper pole medial attachments using a second vascular load stapler but very close to the renal parenchyma in order to perform an adrenal sparing procedure.  Ultimately, the kidney was able to be completely freed up circumferentially.    An endocatch bag was then used to bag the specimen.  The kidney was extracted through the gel port and passed off for pathological analysis.  Pneumoperitoneal pressure was reduced to 7 mmHg and the abdomen was inspected; hemostasis was confirmed.  Surgicel and Surgi-Flo was applied to the liver bed as well as the hilum for additional hemostasis.  The 12 mm trocars were removed and the port sites closed with previously placed  0 Vicryl. The Gelport was removed. The anterior fascia was closed with a running 1 looped PDS.  The subcutaneous tissue was closed using 2-0 vicryl. All the incisions were irrigated, patted dry, and then the skin was reapproximated with 4-0 Monocryl in a subcuticular fashion. The wounds were cleaned and dried and covered with Dermabond. All sponge, needle, and instrument counts were reported correct x2.   The patient was awakened from anesthesia and transferred to recovery in stable condition. There were no complications. The patient tolerated the procedure well. ______________________________    Hollice Espy, MD

## 2019-06-17 NOTE — Progress Notes (Signed)
MD notified: Would you be able to order voltaren gel for this patient. He has been dealing with right shoulder pain since he was at PACU and now on the floor. The patient has a history of arthritis.

## 2019-06-17 NOTE — Interval H&P Note (Signed)
History and Physical Interval Note:  06/17/2019 7:22 AM  [name]  has presented today for surgery, with the diagnosis of right renal mass.  The various methods of treatment have been discussed with the patient and family. After consideration of risks, benefits and other options for treatment, the patient has consented to  Procedure(s): HAND ASSISTED LAPAROSCOPIC NEPHRECTOMY (Right) as a surgical intervention.  The patient's history has been reviewed, patient examined, no change in status, stable for surgery.  I have reviewed the patient's chart and labs.  Questions were answered to the patient's satisfaction.    RRR CTAB  Hollice Espy

## 2019-06-17 NOTE — Progress Notes (Signed)
PHARMACIST - PHYSICIAN ORDER COMMUNICATION  CONCERNING: P&T Medication Policy on Herbal Medications  DESCRIPTION:  This patient's order for:  Selenium  has been noted.  This product(s) is classified as an "herbal" or natural product. Due to a lack of definitive safety studies or FDA approval, nonstandard manufacturing practices, plus the potential risk of unknown drug-drug interactions while on inpatient medications, the Pharmacy and Therapeutics Committee does not permit the use of "herbal" or natural products of this type within [place].   ACTION TAKEN: The pharmacy department is unable to verify this order at this time and your patient has been informed of this safety policy. Please reevaluate patient's clinical condition at discharge and address if the herbal or natural product(s) should be resumed at that time.   

## 2019-06-18 LAB — CBC
HCT: 29.8 % — ABNORMAL LOW (ref 39.0–52.0)
Hemoglobin: 9.8 g/dL — ABNORMAL LOW (ref 13.0–17.0)
MCH: 32.5 pg (ref 26.0–34.0)
MCHC: 32.9 g/dL (ref 30.0–36.0)
MCV: 98.7 fL (ref 80.0–100.0)
Platelets: 169 10*3/uL (ref 150–400)
RBC: 3.02 MIL/uL — ABNORMAL LOW (ref 4.22–5.81)
RDW: 15.3 % (ref 11.5–15.5)
WBC: 12.5 10*3/uL — ABNORMAL HIGH (ref 4.0–10.5)
nRBC: 0 % (ref 0.0–0.2)

## 2019-06-18 LAB — BASIC METABOLIC PANEL
Anion gap: 6 (ref 5–15)
BUN: 18 mg/dL (ref 8–23)
CO2: 27 mmol/L (ref 22–32)
Calcium: 7.8 mg/dL — ABNORMAL LOW (ref 8.9–10.3)
Chloride: 101 mmol/L (ref 98–111)
Creatinine, Ser: 0.9 mg/dL (ref 0.61–1.24)
GFR calc Af Amer: >60 mL/min (ref >60)
GFR calc non Af Amer: >60 mL/min (ref >60)
Glucose, Bld: 114 mg/dL — ABNORMAL HIGH (ref 70–99)
Potassium: 5.1 mmol/L (ref 3.5–5.1)
Sodium: 134 mmol/L — ABNORMAL LOW (ref 135–145)

## 2019-06-18 NOTE — Progress Notes (Signed)
1 Day Post-Op Subjective: The patient is doing well.  No nausea or vomiting. Pain is adequately controlled.  Not yet out of bed.  Lots of questions this morning about Eliquis, ambulating, etc.  No flatus.  Objective: Vital signs in last 24 hours: Temp:  [96.6 F (35.9 C)-98.9 F (37.2 C)] 98.9 F (37.2 C) (07/21 0641) Pulse Rate:  [55-89] 89 (07/21 0641) Resp:  [10-28] 16 (07/21 0641) BP: (92-176)/(51-77) 104/77 (07/21 0641) SpO2:  [60 %-100 %] 95 % (07/21 0641)  Intake/Output from previous day: 07/20 0701 - 07/21 0700 In: 3721.8 [P.O.:950; I.V.:2471.8; IV Piggyback:300] Out: 610 [Urine:510; Blood:100] Intake/Output this shift: No intake/output data recorded.  Physical Exam:  General: Alert and oriented. CV: RRR Lungs: Clear bilaterally. GI: Soft, Nondistended.  Wound clean dry and intact.  Small amount of ecchymosis around larger right lower quadrant incision. Incisions: Clean and dry. Urine: Clear, Foley in place Extremities: Nontender, no erythema, no edema.  Lab Results: Recent Labs    06/18/19 0259  HGB 9.8*  HCT 29.8*          Recent Labs    06/13/19 1441 06/18/19 0259  CREATININE 0.58* 0.90           Results for orders placed or performed during the hospital encounter of 06/17/19 (from the past 24 hour(s))  Glucose, capillary     Status: Abnormal   Collection Time: 06/17/19  7:57 PM  Result Value Ref Range   Glucose-Capillary 139 (H) 70 - 99 mg/dL  CBC     Status: Abnormal   Collection Time: 06/18/19  2:59 AM  Result Value Ref Range   WBC 12.5 (H) 4.0 - 10.5 K/uL   RBC 3.02 (L) 4.22 - 5.81 MIL/uL   Hemoglobin 9.8 (L) 13.0 - 17.0 g/dL   HCT 29.8 (L) 39.0 - 52.0 %   MCV 98.7 80.0 - 100.0 fL   MCH 32.5 26.0 - 34.0 pg   MCHC 32.9 30.0 - 36.0 g/dL   RDW 15.3 11.5 - 15.5 %   Platelets 169 150 - 400 K/uL   nRBC 0.0 0.0 - 0.2 %  Basic metabolic panel     Status: Abnormal   Collection Time: 06/18/19  2:59 AM  Result Value Ref Range   Sodium 134 (L)  135 - 145 mmol/L   Potassium 5.1 3.5 - 5.1 mmol/L   Chloride 101 98 - 111 mmol/L   CO2 27 22 - 32 mmol/L   Glucose, Bld 114 (H) 70 - 99 mg/dL   BUN 18 8 - 23 mg/dL   Creatinine, Ser 0.90 0.61 - 1.24 mg/dL   Calcium 7.8 (L) 8.9 - 10.3 mg/dL   GFR calc non Af Amer >60 >60 mL/min   GFR calc Af Amer >60 >60 mL/min   Anion gap 6 5 - 15   *Note: Due to a large number of results and/or encounters for the requested time period, some results have not been displayed. A complete set of results can be found in Results Review.    Assessment/Plan: POD# 1 s/p right HA lap nephrectomy.  1) Ambulate, Incentive spirometry 2) Advance diet as tolerated (clears until BM/ flatus), d/c IVF 3) Transition to oral pain medication 4) continue home meds except for we will continue to hold Eliquis until tomorrow, hold methotrexate 5) PT consult-ambulates with walker at baseline     LOS: 1 day   [name] 06/18/2019, 8:33 AM

## 2019-06-18 NOTE — Anesthesia Postprocedure Evaluation (Signed)
Anesthesia Post Note  Patient: KALVEN GANIM  Procedure(s) Performed: HAND ASSISTED LAPAROSCOPIC NEPHRECTOMY (Right Abdomen)  Patient location during evaluation: PACU Anesthesia Type: General Level of consciousness: awake and alert Pain management: pain level controlled Vital Signs Assessment: post-procedure vital signs reviewed and stable Respiratory status: spontaneous breathing, nonlabored ventilation, respiratory function stable and patient connected to nasal cannula oxygen Cardiovascular status: blood pressure returned to baseline and stable Postop Assessment: no apparent nausea or vomiting Anesthetic complications: no     Last Vitals:  Vitals:   06/18/19 0912 06/18/19 1010  BP: (!) 101/45 121/70  Pulse: 83   Resp:    Temp:    SpO2:      Last Pain:  Vitals:   06/18/19 0905  TempSrc: Oral  PainSc:                  Martha Clan

## 2019-06-18 NOTE — Progress Notes (Addendum)
Urologist was notified of the patients blood pressure after sitting in the chair after working with PT. BP was 121/70 and most recent BP is 107/46. MD approved to provide the patient with schedule lasix yet continue to hold amlodipine at this time. The patient has not passed any gas yet. Leaking from around Foley catheter. The patient indicates he has a history of leakage from around Foley catheter. The patient ambulated with the nurse from the chair in the room to the room door this evening.

## 2019-06-18 NOTE — Progress Notes (Signed)
PT Cancellation Note  Patient Details Name: [name] MRN: [id number] DOB: Oct 27, 1934   Cancelled Treatment:    Reason Eval/Treat Not Completed: Patient at procedure or test/unavailable.  Nursing currently in pt room and pt preparing to eat breakfast.  Will give pt enough time to eat and return shortly.  Roxanne Gates, PT, DPT  Roxanne Gates 06/18/2019, 8:41 AM

## 2019-06-18 NOTE — Evaluation (Signed)
Physical Therapy Evaluation Patient Details Name: [name] MRN: [id number] DOB: September 30, 1934 Today's Date: 06/18/2019   History of Present Illness  Pt is an 85 year old male admitted following a R side laproscopic nephrectomy.  PMH includes CA, arthritis, anxiety, CVD and CKD.  Clinical Impression  Pt is an 85 year old male who lives with his wife and daughter.  He is limited to household ambulation with a RW at baseline.  Pt able to perform bed mobility mod I and sit at EOB with only report of some pain over incision site.  Pt presented with fair LE strength and limited UE strength including R shoulder ROM deficits.  Pt able to perform short transfer to chair with RW following mild symptoms of hypotension, though BP reading was 120/71 when sitting.  PT unable to obtain further BP reading due to pt having bilateral IV sites which required attending from RN.  Pt open to all education and willing to work with therapy.  Pt will continue to benefit from continued PT with focus on strength, safe mobility and fall prevention.    Follow Up Recommendations Home health PT;Supervision - Intermittent    Equipment Recommendations  None recommended by PT    Recommendations for Other Services       Precautions / Restrictions Precautions Precautions: Fall Restrictions Weight Bearing Restrictions: No      Mobility  Bed Mobility Overal bed mobility: Modified Independent             General bed mobility comments: Use of bedrail and increased time due to pain.  Transfers Overall transfer level: Needs assistance Equipment used: Rolling walker (2 wheeled) Transfers: Sit to/from [facility] Sit to Stand: Min guard Stand pivot transfers: Min guard       General transfer comment: Pt able to rise from elevated bedside with min VC's for use of RW and hand placement.  Pt guarded very closely due to pt hypotension reading prior to session.  Ambulation/Gait Ambulation/Gait  assistance: (Deferred due to hypotension with mild symptoms.)              Stairs            Wheelchair Mobility    Modified Rankin (Stroke Patients Only)       Balance Overall balance assessment: Needs assistance Sitting-balance support: Feet supported Sitting balance-[name]: Good     Standing balance support: Bilateral upper extremity supported Standing balance-[name]: Fair                               Pertinent Vitals/Pain Pain Assessment: Faces Faces Pain Scale: Hurts little more Pain Location: RUQ abdominal incision site. Pain Descriptors / Indicators: Sharp;Guarding Pain Intervention(s): Limited activity within patient's tolerance;Premedicated before session;Monitored during session    Home Living Family/patient expects to be discharged to:: Private residence Living Arrangements: Spouse/significant other;Children(Daughter lives with pt full time.) Available Help at Discharge: Family;Available 24 hours/day Type of Home: House Home Access: Stairs to enter Entrance Stairs-Rails: Can reach both Entrance Stairs-Number of Steps: 3 Home Layout: Able to live on main level with bedroom/bathroom Home Equipment: Walker - 2 wheels;Toilet riser      Prior Function Level of Independence: Needs assistance   Gait / Transfers Assistance Needed: Limited to household ambulation with a RW.  ADL's / Homemaking Assistance Needed: Pt's daughter assists him with dressing with certain items such as sweaters.  Daughter is helping with meals as  well.        Hand Dominance        Extremity/Trunk Assessment   Upper Extremity Assessment Upper Extremity Assessment: Generalized weakness;RUE deficits/detail RUE Deficits / Details: Shoulder flexion/abduction ROM limited to 30 degrees with pain.    Lower Extremity Assessment Lower Extremity Assessment: Overall WFL for tasks assessed(Grossly 4-/5: knee flexion/extension.  Hip flexion limited by  abdominal pain.)    Cervical / Trunk Assessment Cervical / Trunk Assessment: Kyphotic  Communication   Communication: HOH(Mildly HOH.  Pt is very talkative and needs to be redirected frequently.)  Cognition Arousal/Alertness: Awake/alert Behavior During Therapy: WFL for tasks assessed/performed Overall Cognitive Status: Within Functional Limits for tasks assessed                                 General Comments: Follows directions consistently.      General Comments      Exercises Other Exercises Other Exercises: Time to monitor BP, recovery from symptoms of hypotension and also nursing in room to assess pt IV site.  x15 min Other Exercises: Review of scapular retraction ther ex for posture while waiting for RN.  PT observed kyphosis and pt report of poor posture causing him to feel "off balance" and also related to shoulder pain.  x5 min   Assessment/Plan    PT Assessment Patient needs continued PT services  PT Problem List Decreased strength;Decreased mobility;Decreased range of motion;Decreased activity tolerance;Decreased balance;Decreased knowledge of use of DME;Impaired sensation(Mild neuropathy.)       PT Treatment Interventions DME instruction;Therapeutic activities;Gait training;Therapeutic exercise;Patient/family education;Stair training;Balance training;Functional mobility training    PT Goals (Current goals can be found in the Care Plan section)  Acute Rehab PT Goals Patient Stated Goal: To return to household ambulation with RW. PT Goal Formulation: With patient Time For Goal Achievement: 07/02/19 Potential to Achieve Goals: Good    Frequency Min 2X/week   Barriers to discharge        Co-evaluation               AM-PAC PT "6 Clicks" Mobility  Outcome Measure Help needed turning from your back to your side while in a flat bed without using bedrails?: A Little Help needed moving from lying on your back to sitting on the side of a flat  bed without using bedrails?: A Little Help needed moving to and from a bed to a chair (including a wheelchair)?: A Little Help needed standing up from a chair using your arms (e.g., wheelchair or bedside chair)?: A Little Help needed to walk in hospital room?: A Lot Help needed climbing 3-5 steps with a railing? : A Lot 6 Click Score: 16    End of Session Equipment Utilized During Treatment: Gait belt Activity Tolerance: Patient limited by fatigue;Patient limited by pain Patient left: in chair;with call bell/phone within reach;with chair alarm set Nurse Communication: Mobility status(Pt IV site in need of attendance.) PT Visit Diagnosis: Unsteadiness on feet (R26.81);Muscle weakness (generalized) (M62.81);Difficulty in walking, not elsewhere classified (R26.2)    Time: 0930-1007 PT Time Calculation (min) (ACUTE ONLY): 37 min   Charges:   PT Evaluation $PT Eval Low Complexity: 1 Low PT Treatments $Therapeutic Activity: 8-22 mins        Roxanne Gates, PT, DPT   Roxanne Gates 06/18/2019, 10:25 AM

## 2019-06-18 NOTE — Progress Notes (Signed)
Dr. Erlene Quan was messaged about the patient's low blood pressure this morning. She indicates to monitor the patient for now as long as he is asymptomatic. Metoprolol and lasix being held for now and the patients vital signs will be re-evaluated again to see if the blood pressure improves. No further interventions for now.

## 2019-06-18 NOTE — Progress Notes (Addendum)
MD notified: [name], the patient has significant drainage from around foley catheter which he indicates it occurs at home as well. Total urine output measured has been 275 today and the patient has not passed any gas. Lasix given today. BP was held and MD is aware. The patient walked in the hallway and did one lap around the nurses station with the nursing assistant.

## 2019-06-18 NOTE — Progress Notes (Signed)
Urologist messaged: The patient wanted to know if his eliquis will be restarted and if his diet could be advance as he tolerated the clear liquid diet last night.

## 2019-06-19 LAB — CBC
HCT: 24.8 % — ABNORMAL LOW (ref 39.0–52.0)
Hemoglobin: 8.1 g/dL — ABNORMAL LOW (ref 13.0–17.0)
MCH: 32.4 pg (ref 26.0–34.0)
MCHC: 32.7 g/dL (ref 30.0–36.0)
MCV: 99.2 fL (ref 80.0–100.0)
Platelets: 137 10*3/uL — ABNORMAL LOW (ref 150–400)
RBC: 2.5 MIL/uL — ABNORMAL LOW (ref 4.22–5.81)
RDW: 15.2 % (ref 11.5–15.5)
WBC: 6 10*3/uL (ref 4.0–10.5)
nRBC: 0 % (ref 0.0–0.2)

## 2019-06-19 LAB — BASIC METABOLIC PANEL
Anion gap: 5 (ref 5–15)
BUN: 17 mg/dL (ref 8–23)
CO2: 29 mmol/L (ref 22–32)
Calcium: 7.7 mg/dL — ABNORMAL LOW (ref 8.9–10.3)
Chloride: 100 mmol/L (ref 98–111)
Creatinine, Ser: 0.91 mg/dL (ref 0.61–1.24)
GFR calc Af Amer: >60 mL/min (ref >60)
GFR calc non Af Amer: >60 mL/min (ref >60)
Glucose, Bld: 90 mg/dL (ref 70–99)
Potassium: 4.8 mmol/L (ref 3.5–5.1)
Sodium: 134 mmol/L — ABNORMAL LOW (ref 135–145)

## 2019-06-19 MED ORDER — ALPRAZOLAM 0.5 MG PO TABS
1.0000 mg | ORAL_TABLET | Freq: Every evening | ORAL | Status: DC | PRN
  Administered 2019-06-19 – 2019-06-22 (×2): 1 mg via ORAL
  Filled 2019-06-19 (×3): qty 2

## 2019-06-19 MED ORDER — ALPRAZOLAM 0.5 MG PO TABS
0.5000 mg | ORAL_TABLET | Freq: Two times a day (BID) | ORAL | Status: DC | PRN
  Administered 2019-06-22: 0.5 mg via ORAL
  Filled 2019-06-19: qty 1

## 2019-06-19 NOTE — Progress Notes (Signed)
2 Days Post-Op Subjective: Feeling more discouraged today.  Ambulated yesterday with physical therapy and was about to ambulate with the nurse around lunchtime today.  Some leakage around the Foley catheter which is chronic.  No flatus or bowel movement today.  Blood pressure is stabilized.  CBC with downward trending hemoglobin.  Electrolytes stable.  Objective: Vital signs in last 24 hours: Temp:  [98.2 F (36.8 C)-98.9 F (37.2 C)] 98.9 F (37.2 C) (07/22 1209) Pulse Rate:  [84-103] 102 (07/22 1209) Resp:  [16-20] 20 (07/22 1209) BP: (97-120)/(43-60) 97/46 (07/22 1209) SpO2:  [91 %-96 %] 95 % (07/22 1209)  Intake/Output from previous day: 07/21 0701 - 07/22 0700 In: 1740 [P.O.:1740] Out: 1525 [Urine:1525] Intake/Output this shift: No intake/output data recorded.  Physical Exam:  General: Alert and oriented. CV: RRR Lungs: Clear bilaterally. GI: Soft, Nondistended.  Wound clean dry and intact.  Small amount of ecchymosis around larger right lower quadrant incision. Incisions: Clean and dry. Urine: Clear, Foley in place Extremities: Nontender, no erythema, no edema.  Lab Results: Recent Labs    06/18/19 0259 06/19/19 0450  HGB 9.8* 8.1*  HCT 29.8* 24.8*          Recent Labs    06/13/19 1441 06/18/19 0259 06/19/19 0450  CREATININE 0.58* 0.90 0.91           Results for orders placed or performed during the hospital encounter of 06/17/19 (from the past 24 hour(s))  CBC     Status: Abnormal   Collection Time: 06/19/19  4:50 AM  Result Value Ref Range   WBC 6.0 4.0 - 10.5 K/uL   RBC 2.50 (L) 4.22 - 5.81 MIL/uL   Hemoglobin 8.1 (L) 13.0 - 17.0 g/dL   HCT 24.8 (L) 39.0 - 52.0 %   MCV 99.2 80.0 - 100.0 fL   MCH 32.4 26.0 - 34.0 pg   MCHC 32.7 30.0 - 36.0 g/dL   RDW 15.2 11.5 - 15.5 %   Platelets 137 (L) 150 - 400 K/uL   nRBC 0.0 0.0 - 0.2 %  Basic metabolic panel     Status: Abnormal   Collection Time: 06/19/19  4:50 AM  Result Value Ref Range   Sodium 134  (L) 135 - 145 mmol/L   Potassium 4.8 3.5 - 5.1 mmol/L   Chloride 100 98 - 111 mmol/L   CO2 29 22 - 32 mmol/L   Glucose, Bld 90 70 - 99 mg/dL   BUN 17 8 - 23 mg/dL   Creatinine, Ser 0.91 0.61 - 1.24 mg/dL   Calcium 7.7 (L) 8.9 - 10.3 mg/dL   GFR calc non Af Amer >60 >60 mL/min   GFR calc Af Amer >60 >60 mL/min   Anion gap 5 5 - 15   *Note: Due to a large number of results and/or encounters for the requested time period, some results have not been displayed. A complete set of results can be found in Results Review.    Assessment/Plan: POD# 2 s/p right HA lap nephrectomy.  1) Ambulate, Incentive spirometry 2) Advance diet as tolerated (clears until BM/ flatus) 3) Oral pain medication as needed 4) continue home meds except for we will continue to hold Eliquis in setting of acute blood loss anemia, hold methotrexate 5) PT consult-recommend home with home health 6) recheck H/H in AM  If he starts passing flatus, will advance diet and discharge once H&H stabilizes.   LOS: 2 days   Hollice Espy 06/19/2019, 7:43 PM

## 2019-06-19 NOTE — Discharge Summary (Signed)
Date of admission: 06/17/2019  Date of discharge: 06/23/19  Admission diagnosis: Right kidney cancer - clear cell carcinoma  Discharge diagnosis: Same as above  Secondary diagnoses:  Patient Active Problem List   Diagnosis Date Noted  . Sore throat 06/07/2019  . Paroxysmal atrial fibrillation ([place]) 05/09/2019  . Persistent atrial fibrillation   . Dental abscess 11/02/2018  . Chronic constipation 07/25/2018  . Right renal mass 07/25/2018  . TIA (transient ischemic attack) 06/06/2018  . Carotid artery disease ([place]) 06/06/2018  . Expressive aphasia 05/25/2018  . Complicated UTI (urinary tract infection) 10/18/2017  . SIADH (syndrome of inappropriate ADH production) ([place]) 04/17/2017  . Hyponatremia 01/16/2017  . UTI (urinary tract infection) 12/19/2016  . Asthma with acute exacerbation 11/14/2016  . Mood disorder ([place]) 01/18/2016  . Presence of indwelling urinary catheter 08/20/2015  . Advanced directives, counseling/discussion 07/28/2014  . Routine general medical examination at a health care facility 04/09/2012  . History of prostate cancer 04/09/2012  . Venous stasis dermatitis 06/13/2011  . Chronic sinusitis   . CONSTIPATION, CHRONIC 12/07/2010  . Hyperlipemia 12/02/2010  . OSTEOARTHRITIS 12/02/2010  . NEUROGENIC BLADDER 05/24/2010  . IBS 05/05/2010  . PERSONAL HISTORY OF COLONIC POLYPS 05/05/2010  . NEUROPATHY 05/03/2010  . Essential hypertension, benign 03/25/2009  . GAD (generalized anxiety disorder) 01/27/2007  . Coronary atherosclerosis of native coronary artery 01/27/2007  . Allergic asthma 01/27/2007  . GERD 01/27/2007  . Chronic rheumatic arthritis ([place]) 01/27/2007   History and Physical: For full details, please see admission history and physical. Briefly, [name] is a 85 y.o. year old patient with multiple comorbidities admitted on 06/17/2019 for scheduled hand-assisted laparoscopic right radical nephrectomy.   Hospital Course: Patient tolerated the  procedure well.  He was then transferred to the floor after an uneventful PACU stay.  He experienced some intermittent, asymptomatic hypotension on POD1; amlodipine, metoprolol, and Lasix were held incidentally to allow this to resolve. Patient presented with orthostatic hypotension, emesis, and postoperative ileus on POD3; he was kept NPO and treated with IV fluids and Dulcolax suppositories and achieved both flatus and a bowel movement that evening. Diet was advanced on POD4 after patient achieved bowel movement and flatus. Patient was transitioned to oral pain medication on POD0. I encouraged him to ambulate and use incentive spirometry during his inpatient stay. Due to low hematocrit, his Eliquis was held after surgery and restarted at discharge. Methotrexate was also held until discharge.  His blood pressure was low and his metoprolol was reduced to 12.'5mg'$  BID from '25mg'$  BID.   PT evaluated the patient and recommended that he be discharged home with a wheelchair.   On POD#6 he had met discharge criteria: was eating a regular diet, was up and ambulating,  pain was well controlled, and was ready to for discharge.  Laboratory values:  Recent Labs    06/20/19 1414 06/21/19 0249 06/22/19 0535 06/22/19 1456  WBC 10.7* 10.2 4.7  --   HGB 9.9* 8.9* 7.7* 8.5*  HCT 29.0* 26.2* 23.2* 25.5*   Recent Labs    06/20/19 1414 06/21/19 0249 06/22/19 0535  NA 131* 134* 132*  K 4.9 4.9 4.1  CL 94* 98 100  CO2 '25 23 25  '$ GLUCOSE 90 84 100*  BUN 25* 36* 27*  CREATININE 1.28* 1.37* 0.87  CALCIUM 8.1* 8.0* 7.4*   No results for input(s): LABPT, INR in the last 72 hours. No results for input(s): LABURIN in the last 72 hours. Results for orders placed or performed during  the hospital encounter of 06/13/19  SARS Coronavirus 2 (Performed in [place] hospital lab)     Status: None   Collection Time: 06/13/19  2:49 PM   Specimen: Nasal Swab  Result Value Ref Range Status   SARS Coronavirus 2 NEGATIVE  NEGATIVE Final    Comment: (NOTE) SARS-CoV-2 target nucleic acids are NOT DETECTED. The SARS-CoV-2 RNA is generally detectable in upper and lower respiratory specimens during the acute phase of infection. Negative results do not preclude SARS-CoV-2 infection, do not rule out co-infections with other pathogens, and should not be used as the sole basis for treatment or other patient management decisions. Negative results must be combined with clinical observations, patient history, and epidemiological information. The expected result is Negative. Fact Sheet for Patients: SugarRoll.be Fact Sheet for Healthcare Providers: https://www.woods-mathews.com/ This test is not yet approved or cleared by the [place] FDA and  has been authorized for detection and/or diagnosis of SARS-CoV-2 by FDA under an Emergency Use Authorization (EUA). This EUA will remain  in effect (meaning this test can be used) for the duration of the COVID-19 declaration under Section 56 4(b)(1) of the Act, 21 U.S.C. section 360bbb-3(b)(1), unless the authorization is terminated or revoked sooner. Performed at [facility], [place] [address], [place], [place] [address]    *Note: Due to a large number of results and/or encounters for the requested time period, some results have not been displayed. A complete set of results can be found in Results Review.   Disposition: Home with PT  Discharge instruction: The patient was instructed to be ambulatory but told to refrain from heavy lifting, strenuous activity, or driving.   Discharge medications:  Allergies as of 06/23/2019      Reactions   Ciprofloxacin Nausea And Vomiting   Headache   Citalopram Hydrobromide Other (See Comments)   "unknown"   Clindamycin Nausea And Vomiting   Lorazepam Other (See Comments)   Adverse reaction   Paroxetine Nausea Only   Ramipril Other (See Comments)   "unknown"   Simvastatin     Sulfa Antibiotics Other (See Comments)      Medication List    TAKE these medications   acetaminophen 500 MG tablet Commonly known as: TYLENOL Take 500 mg by mouth every 8 (eight) hours as needed for moderate pain or headache.   albuterol 108 (90 Base) MCG/ACT inhaler Commonly known as: VENTOLIN HFA Inhale 2 puffs into the lungs every 6 (six) hours as needed for wheezing or shortness of breath.   Align 4 MG Caps Take 1 capsule (4 mg total) by mouth daily.   ALPRAZolam 0.5 MG tablet Commonly known as: XANAX TAKE 1 TABLET BY MOUTH THREE TIMES DAILY AS NEEDED FOR ANXIETY OR SLEEP   apixaban 5 MG Tabs tablet Commonly known as: Eliquis Take 1 tablet (5 mg total) by mouth 2 (two) times daily.   Co Q 10 100 MG Caps Take 100 mg by mouth daily.   fluticasone 50 MCG/ACT nasal spray Commonly known as: FLONASE Place 2 sprays into both nostrils 2 (two) times daily.   folic acid 106 MCG tablet Commonly known as: FOLVITE Take 1,200 mcg by mouth daily.   furosemide 20 MG tablet Commonly known as: LASIX Take 1 tablet (20 mg total) by mouth daily.   gabapentin 300 MG capsule Commonly known as: NEURONTIN TAKE 1 CAPSULE BY MOUTH THREE TIMES DAILY   hydroxychloroquine 200 MG tablet Commonly known as: PLAQUENIL Take 200 mg by mouth 2 (two) times  daily.   magnesium citrate Soln Take 148 mLs (0.5 Bottles total) by mouth once for 1 dose. Repeat the next day if still no BM.   methotrexate 2.5 MG tablet Commonly known as: RHEUMATREX Take 25 mg by mouth every Wednesday.   metoprolol tartrate 25 MG tablet Commonly known as: LOPRESSOR Take 0.5 tablets (12.5 mg total) by mouth 2 (two) times daily. What changed: how much to take   multivitamin tablet Take 1 tablet by mouth daily.   Myrbetriq 50 MG Tb24 tablet Generic drug: mirabegron ER TAKE 1 TABLET(50 MG) BY MOUTH DAILY What changed: See the new instructions.   polyethylene glycol powder 17 GM/SCOOP powder Commonly known as:  GLYCOLAX/MIRALAX Take 17 g by mouth at bedtime.   pravastatin 20 MG tablet Commonly known as: PRAVACHOL TAKE 1 TABLET BY MOUTH EVERY DAY What changed: when to take this   Selenium 200 MCG Caps Take 200 mcg by mouth daily.   sodium chloride 1 g tablet Take 1 tablet (1 g total) by mouth 2 (two) times daily with a meal. What changed: when to take this   traMADol 50 MG tablet Commonly known as: Ultram Take 1-2 tablets (50-100 mg total) by mouth every 6 (six) hours as needed for moderate pain.   Turmeric 500 MG Caps Take 500 mg by mouth daily.   Vitamin D3 125 MCG (5000 UT) Caps Take 5,000 Units by mouth daily.            Durable Medical Equipment  (From admission, onward)         Start     Ordered   06/23/19 1030  For home use only DME lightweight manual wheelchair with seat cushion  Once    Comments: Patient suffers from arthritis which impairs their ability to perform daily activities like dressing, bathing in the home.  A cane/walker will not resolve  issue with performing activities of daily living. A wheelchair will allow patient to safely perform daily activities. Patient is not able to propel themselves in the home using a standard weight wheelchair due to weakness. Patient can self propel in the lightweight wheelchair. Length of need for lifetime. Accessories: elevating leg rests (ELRs), wheel locks, extensions and anti-tippers.   06/23/19 1030          Followup:  He is scheduled for postop follow-up with indwelling Foley exchange in my office on 07/17/2019. Follow-up Information    Hollice Espy, MD On 07/16/2019.   Specialty: Urology Contact information: [place] [id number] [phone]        Please follow up.   Why: 1:30 PM

## 2019-06-20 ENCOUNTER — Inpatient Hospital Stay: Payer: Medicare PPO

## 2019-06-20 ENCOUNTER — Other Ambulatory Visit: Payer: Self-pay | Admitting: Pathology

## 2019-06-20 LAB — BASIC METABOLIC PANEL
Anion gap: 12 (ref 5–15)
BUN: 25 mg/dL — ABNORMAL HIGH (ref 8–23)
CO2: 25 mmol/L (ref 22–32)
Calcium: 8.1 mg/dL — ABNORMAL LOW (ref 8.9–10.3)
Chloride: 94 mmol/L — ABNORMAL LOW (ref 98–111)
Creatinine, Ser: 1.28 mg/dL — ABNORMAL HIGH (ref 0.61–1.24)
GFR calc Af Amer: 60 mL/min — ABNORMAL LOW (ref >60)
GFR calc non Af Amer: 51 mL/min — ABNORMAL LOW (ref >60)
Glucose, Bld: 90 mg/dL (ref 70–99)
Potassium: 4.9 mmol/L (ref 3.5–5.1)
Sodium: 131 mmol/L — ABNORMAL LOW (ref 135–145)

## 2019-06-20 LAB — CBC
HCT: 29 % — ABNORMAL LOW (ref 39.0–52.0)
Hemoglobin: 9.9 g/dL — ABNORMAL LOW (ref 13.0–17.0)
MCH: 32.7 pg (ref 26.0–34.0)
MCHC: 34.1 g/dL (ref 30.0–36.0)
MCV: 95.7 fL (ref 80.0–100.0)
Platelets: 246 10*3/uL (ref 150–400)
RBC: 3.03 MIL/uL — ABNORMAL LOW (ref 4.22–5.81)
RDW: 14.6 % (ref 11.5–15.5)
WBC: 10.7 10*3/uL — ABNORMAL HIGH (ref 4.0–10.5)
nRBC: 0 % (ref 0.0–0.2)

## 2019-06-20 LAB — HEMOGLOBIN AND HEMATOCRIT, BLOOD
HCT: 28.8 % — ABNORMAL LOW (ref 39.0–52.0)
Hemoglobin: 9.7 g/dL — ABNORMAL LOW (ref 13.0–17.0)

## 2019-06-20 LAB — SURGICAL PATHOLOGY

## 2019-06-20 MED ORDER — HEPARIN SODIUM (PORCINE) 5000 UNIT/ML IJ SOLN
5000.0000 [IU] | Freq: Three times a day (TID) | INTRAMUSCULAR | Status: DC
  Administered 2019-06-20 – 2019-06-23 (×8): 5000 [IU] via SUBCUTANEOUS
  Filled 2019-06-20 (×8): qty 1

## 2019-06-20 MED ORDER — APIXABAN 5 MG PO TABS: 5.0000 mg | ORAL_TABLET | Freq: Two times a day (BID) | ORAL | Status: DC

## 2019-06-20 MED ORDER — CALCIUM CARBONATE ANTACID 500 MG PO CHEW
1.0000 | CHEWABLE_TABLET | Freq: Four times a day (QID) | ORAL | Status: DC | PRN
  Administered 2019-06-20: 200 mg via ORAL
  Filled 2019-06-20: qty 1

## 2019-06-20 MED ORDER — FAMOTIDINE IN NACL 20-0.9 MG/50ML-% IV SOLN
20.0000 mg | Freq: Once | INTRAVENOUS | Status: AC
  Administered 2019-06-20: 20 mg via INTRAVENOUS
  Filled 2019-06-20: qty 50

## 2019-06-20 MED ORDER — SODIUM CHLORIDE 0.9 % IV SOLN
INTRAVENOUS | Status: DC
  Administered 2019-06-20 – 2019-06-22 (×4): via INTRAVENOUS

## 2019-06-20 MED ORDER — BISACODYL 10 MG RE SUPP
10.0000 mg | Freq: Once | RECTAL | Status: AC
  Administered 2019-06-20: 10 mg via RECTAL
  Filled 2019-06-20: qty 1

## 2019-06-20 NOTE — Progress Notes (Signed)
Physical Therapy Treatment Patient Details Name: [name] MRN: [id number] DOB: [birth year] Today's Date: 06/20/2019    History of Present Illness Pt is an 85 year old male admitted following a R side laproscopic nephrectomy.  PMH includes CA, arthritis, anxiety, CVD and CKD.    PT Comments    Pt appearing fatigued today.  He presented with orthostatic hypotension and tachycardia from supine to sit and O2 sats dropped considerably as well.  Pt required mod A to get to bedside and assistance to bring LE's over EOB when returning to bed.  Nurse tech assisted PT in scooting pt up in bed.  Pt able to sit at bedside during monitoring of vitals but reported feeling "tired".  Pt also presented with AMS and appeared to struggle with articulation of words when speaking as compared to PT visit two days ago.  RN notified.  Pt able to follow directions to perform supine there ex in bed.  Pt will continue to benefit from skilled PT with focus on strength, tolerance to activity and safe functional mobility.  Follow Up Recommendations  Home health PT;Supervision - Intermittent     Equipment Recommendations  None recommended by PT    Recommendations for Other Services       Precautions / Restrictions Precautions Precautions: Fall Restrictions Weight Bearing Restrictions: No    Mobility  Bed Mobility Overal bed mobility: Needs Assistance Bed Mobility: Supine to Sit;Sit to Supine     Supine to sit: Mod assist Sit to supine: Min assist   General bed mobility comments: Hand held assist to sit upright and assistance with bringing LE's over EOB due to pt feeling "tired".  Pt hypotensive and slightly tachycardic.  Pt's O2 also borderline hypoxic.  Transfers                    Ambulation/Gait                 Stairs             Wheelchair Mobility    Modified Rankin (Stroke Patients Only)       Balance Overall balance assessment: Needs assistance Sitting-balance  support: Feet supported;Bilateral upper extremity supported Sitting balance-[name]: Fair                                      Cognition Arousal/Alertness: Awake/alert Behavior During Therapy: WFL for tasks assessed/performed Overall Cognitive Status: Within Functional Limits for tasks assessed                                 General Comments: Follows directions consistently.      Exercises General Exercises - Lower Extremity Ankle Circles/Pumps: 20 reps;Both;Strengthening;Supine Quad Sets: Both;10 reps;Supine Heel Slides: Strengthening;Both;10 reps;Supine Hip ABduction/ADduction: Strengthening;Both;10 reps;Supine Other Exercises Other Exercises: Time to monitor vitals x5 min    General Comments        Pertinent Vitals/Pain Pain Assessment: Faces Faces Pain Scale: Hurts little more Pain Location: incision site Pain Intervention(s): Limited activity within patient's tolerance;Monitored during session    Home Living                      Prior Function            PT Goals (current goals can now be found in the care plan section)  Acute Rehab PT Goals Patient Stated Goal: To return to household ambulation with RW. PT Goal Formulation: With patient Time For Goal Achievement: 07/02/19 Potential to Achieve Goals: Good Progress towards PT goals: PT to reassess next treatment    Frequency    Min 2X/week      PT Plan Current plan remains appropriate    Co-evaluation              AM-PAC PT "6 Clicks" Mobility   Outcome Measure  Help needed turning from your back to your side while in a flat bed without using bedrails?: A Little Help needed moving from lying on your back to sitting on the side of a flat bed without using bedrails?: A Little Help needed moving to and from a bed to a chair (including a wheelchair)?: A Little Help needed standing up from a chair using your arms (e.g., wheelchair or bedside chair)?: A  Little Help needed to walk in hospital room?: A Lot Help needed climbing 3-5 steps with a railing? : A Lot 6 Click Score: 16    End of Session   Activity Tolerance: Patient limited by fatigue;Other (comment)(Hypotension) Patient left: in bed;with bed alarm set;with call bell/phone within reach Nurse Communication: Mobility status(Pt IV site in need of attendance.) PT Visit Diagnosis: Unsteadiness on feet (R26.81);Muscle weakness (generalized) (M62.81);Difficulty in walking, not elsewhere classified (R26.2)     Time: 8309-4076 PT Time Calculation (min) (ACUTE ONLY): 32 min  Charges:  $Therapeutic Exercise: 8-22 mins $Therapeutic Activity: 8-22 mins                     Roxanne Gates, PT, DPT   Roxanne Gates 06/20/2019, 1:29 PM

## 2019-06-20 NOTE — Progress Notes (Addendum)
Urology Inpatient Progress Note  Subjective: [name] is a 85 y.o. male who is POD3 from right hand-assisted laparoscopic nephrectomy with Dr. Erlene Quan for right renal mass. Postoperative course has been complicated by intermittent hypotension and limited mobility.   Today, patient has experienced low volume (<15mL) emesis and hypotension to 99/59. He reports nausea and stabbing pain in his upper abdomen when he moves. He reports that he has not had a bowel movement or passed flatus since surgery. He reports some shortness of breath.  Incentive spirometry is not at the bedside. Patient is lying supine in bed and asks for assistance to roll to his side and ambulate. PT has been consulted and recommends in-home PT upon discharge.  On physical exam, patient's abdomen is distended, but not rigid or tense. It is nontender with no rebound tenderness. Some postoperative bruising is visible at his incisions, however there is no erythema or drainage at these sites. No flank ecchymosis.  KUB today with dilated loops of bowel. EKG pending.  Anti-infectives: Anti-infectives (From admission, onward)   Start     Dose/Rate Route Frequency Ordered Stop   06/17/19 1600  ceFAZolin (ANCEF) IVPB 1 g/50 mL premix     1 g 100 mL/hr over 30 Minutes Intravenous Every 8 hours 06/17/19 1509 06/18/19 0010   06/17/19 0642  ceFAZolin (ANCEF) 2-4 GM/100ML-% IVPB    Note to Pharmacy: Ronnell Freshwater   : cabinet override      06/17/19 0642 06/17/19 0758   06/16/19 2212  ceFAZolin (ANCEF) IVPB 2g/100 mL premix     2 g 200 mL/hr over 30 Minutes Intravenous 30 min pre-op 06/16/19 2212 06/17/19 0758      Current Facility-Administered Medications  Medication Dose Route Frequency Provider Last Rate Last Dose  . 0.9 %  sodium chloride infusion   Intravenous Continuous Hollice Espy, MD 75 mL/hr at 06/20/19 [phone]    . acetaminophen (TYLENOL) tablet 650 mg  650 mg Oral Q4H PRN Hollice Espy, MD   650 mg at 06/19/19 1048   . acidophilus (RISAQUAD) capsule 1 capsule  1 capsule Oral Daily Hollice Espy, MD   1 capsule at 06/20/19 0856  . albuterol (PROVENTIL) (2.5 MG/3ML) 0.083% nebulizer solution 2.5 mg  2.5 mg Inhalation Q6H PRN Hollice Espy, MD      . ALPRAZolam Duanne Moron) tablet 0.5 mg  0.5 mg Oral BID PRN Hollice Espy, MD      . ALPRAZolam Duanne Moron) tablet 1 mg  1 mg Oral QHS PRN Hollice Espy, MD   1 mg at 06/19/19 2312  . calcium carbonate (TUMS - dosed in mg elemental calcium) chewable tablet 200 mg of elemental calcium  1 tablet Oral Q6H PRN Hollice Espy, MD   200 mg of elemental calcium at 06/20/19 0241  . diphenhydrAMINE (BENADRYL) injection 12.5 mg  12.5 mg Intravenous Q6H PRN Hollice Espy, MD   12.5 mg at 06/20/19 0555   Or  . diphenhydrAMINE (BENADRYL) 12.5 MG/5ML elixir 12.5 mg  12.5 mg Oral Q6H PRN Hollice Espy, MD      . docusate sodium (COLACE) capsule 100 mg  100 mg Oral BID Hollice Espy, MD   100 mg at 06/20/19 0856  . fluticasone (FLONASE) 50 MCG/ACT nasal spray 2 spray  2 spray Each Nare BID Hollice Espy, MD   2 spray at 06/20/19 0856  . furosemide (LASIX) tablet 20 mg  20 mg Oral Daily Hollice Espy, MD   20 mg at 06/19/19 0813  . gabapentin (NEURONTIN) capsule 300  mg  300 mg Oral TID Hollice Espy, MD   300 mg at 06/20/19 0855  . heparin injection 5,000 Units  5,000 Units Subcutaneous Q8H Hollice Espy, MD   5,000 Units at 06/20/19 0533  . metoprolol tartrate (LOPRESSOR) tablet 25 mg  25 mg Oral BID Hollice Espy, MD   25 mg at 06/19/19 2140  . mirabegron ER (MYRBETRIQ) tablet 50 mg  50 mg Oral Daily Hollice Espy, MD   50 mg at 06/20/19 0855  . morphine 2 MG/ML injection 2-4 mg  2-4 mg Intravenous Q2H PRN Hollice Espy, MD      . ondansetron [facility]) injection 4 mg  4 mg Intravenous Q4H PRN Hollice Espy, MD   4 mg at 06/20/19 0533  . opium-belladonna (B&O SUPPRETTES) 16.2-60 MG suppository 1 suppository  1 suppository Rectal Q6H PRN Hollice Espy, MD       . oxyCODONE-acetaminophen (PERCOCET/ROXICET) 5-325 MG per tablet 1-2 tablet  1-2 tablet Oral Q4H PRN Hollice Espy, MD   1 tablet at 06/20/19 0006  . polyethylene glycol (MIRALAX / GLYCOLAX) packet 17 g  17 g Oral QHS Hollice Espy, MD   17 g at 06/19/19 2135  . pravastatin (PRAVACHOL) tablet 20 mg  20 mg Oral Daily Hollice Espy, MD   20 mg at 06/20/19 0856  . sodium chloride tablet 1 g  1 g Oral BID WC Hollice Espy, MD   1 g at 06/20/19 0855     Objective: Vital signs in last 24 hours: Temp:  [98 F (36.7 C)-98.2 F (36.8 C)] 98 F (36.7 C) (07/23 0606) Pulse Rate:  [85-112] 112 (07/23 0900) Resp:  [18-20] 20 (07/23 0606) BP: (99-125)/(54-66) 99/59 (07/23 0900) SpO2:  [94 %-97 %] 94 % (07/23 0606)  Intake/Output from previous day: 07/22 0701 - 07/23 0700 In: 264 [P.O.:264] Out: 1250 [Urine:1250] Intake/Output this shift: Total I/O In: 100 [P.O.:100] Out: 100 [Emesis/NG output:100]  Physical Exam Vitals signs and nursing note reviewed.  Constitutional:      General: He is not in acute distress.    Appearance: He is not ill-appearing, toxic-appearing or diaphoretic.  HENT:     Head: Normocephalic and atraumatic.  Pulmonary:     Effort: Pulmonary effort is normal. No respiratory distress.  Abdominal:     Comments: See HPI  Skin:    General: Skin is warm and dry.     Coloration: Skin is not pale.     Comments: No skin tenting  Neurological:     Mental Status: He is alert and oriented to person, place, and time.  Psychiatric:        Attention and Perception: Attention normal.        Behavior: Behavior normal.        Thought Content: Thought content normal.     Lab Results:  Recent Labs    06/18/19 0259 06/19/19 0450 06/20/19 0557  WBC 12.5* 6.0  --   HGB 9.8* 8.1* 9.7*  HCT 29.8* 24.8* 28.8*  PLT 169 137*  --    BMET Recent Labs    06/18/19 0259 06/19/19 0450  NA 134* 134*  K 5.1 4.8  CL 101 100  CO2 27 29  GLUCOSE 114* 90  BUN 18 17   CREATININE 0.90 0.91  CALCIUM 7.8* 7.7*   Studies/Results: Dg Abd 1 View  Result Date: 06/20/2019 CLINICAL DATA:  Vomiting, nausea, history of prostate cancer, hypertension, GERD, CHF, CAD, asthma, anemia EXAM: ABDOMEN - 1 VIEW COMPARISON:  04/23/2010, CT of  the abdomen and pelvis on 05/21/2019 FINDINGS: Portions of the abdomen are excluded. There has been interval development of significant small bowel dilatation. A small amount of gas persists within the colon but the colon is not dilated. Multiple surgical clips are identified within the pelvis. IMPRESSION: Interval development of small bowel obstruction. Recommend further evaluation with CT of the abdomen and pelvis with intravenous and oral contrast. These results will be called to the ordering clinician or representative by the Radiologist Assistant, and communication documented in the PACS or zVision Dashboard. Electronically Signed   By: Nolon Nations M.D.   On: 06/20/2019 11:42   I personally reviewed the images above and disagree: based on clinical presentation, image is consistent with postoperative ileus and does not require further imaging at this time.  Assessment & Plan: 1. S/p hand assisted laparoscopic right nephrectomy Patient with postop ileus today, as supported by KUB and clinical presentation. Can attribute abdominal pain to gas pain given patient has not get had BM or passed flatus since surgery. Patient lacking physical exam signs concerning for acute decompensation: abdomen distended but not rigid or tender. Will return patient to NPO status and promote ambulation, avoidance of opioids, and use of laxative suppositories to encourage bowel activity.  Recommend use of incentive spirometry to address postoperative shortness of breath.  Ordered STAT CBC, BMP to evaluate for infection or electrolyte imbalance contributing to the patient's presentation.  - Encourage incentive spirometry - Encourage ambulation - Encourage  acetaminophen over oxycodone for pain control - Dulcolax suppository to promote BM, flatus - Restart IVF, 75cc/hour - NPO for now, advance diet to clears once patient achieves BM and/or flatus - Continue heparin - STAT CBC, BMP  Debroah Loop, PA-C 06/20/2019  MD ATTESTATION: I saw and examined the patient and agree with PA note above.  Mr. Hoard is an 85 year old comorbid male postop day 3 from right hand-assisted laparoscopic radical nephrectomy for 3 cm renal tumor with Dr. Erlene Quan.  Postop course complicated today by low-grade tachycardia, EKG shows sinus tachycardia, small emesis, and KUB consistent with ileus.  Labs reviewed, WBC 10.7, hematocrit stable at 29, creatinine 1.28.  Exam: No acute distress, conversational Abdomen mildly distended, soft, minimally tender, non-peritonitic, incisions minimally tender  Plan: Hold Eliquis, continue subcutaneous heparin Suppository today Consider NG if worsening emesis IS, ambulation, minimize narcotics  Nickolas Madrid, MD 06/20/2019

## 2019-06-20 NOTE — Care Management Important Message (Signed)
Important Message  Patient Details  Name: [name] MRN: [id number] Date of Birth: Apr 10, 1934   Medicare Important Message Given:  Yes     Dannette Barbara 06/20/2019, 1:14 PM

## 2019-06-20 NOTE — Progress Notes (Addendum)
Pt BP 99/59, HR 112. Patient feeling nauseated. Zofran given 0533. Held Metoprolol and Lasix. Per Dr. Erlene Quan keep pt NPO and order KUB. Hold on additional dose of Zofran for now.   Fuller Mandril, RN

## 2019-06-20 NOTE — Progress Notes (Signed)
PT Cancellation Note  Patient Details Name: NAYIB REMER MRN: [id number] DOB: Sep 09, 1934   Cancelled Treatment:    Reason Eval/Treat Not Completed: Patient at procedure or test/unavailable.  Nursing currently in room with pt.  Pt very eager to work with therapy but became ill after taking his medication.  Will give pt time to recover and return later this a.m.  Roxanne Gates, PT, DPT  Roxanne Gates 06/20/2019, 9:11 AM

## 2019-06-21 DIAGNOSIS — N2889 Other specified disorders of kidney and ureter: Secondary | ICD-10-CM

## 2019-06-21 LAB — BASIC METABOLIC PANEL
Anion gap: 13 (ref 5–15)
BUN: 36 mg/dL — ABNORMAL HIGH (ref 8–23)
CO2: 23 mmol/L (ref 22–32)
Calcium: 8 mg/dL — ABNORMAL LOW (ref 8.9–10.3)
Chloride: 98 mmol/L (ref 98–111)
Creatinine, Ser: 1.37 mg/dL — ABNORMAL HIGH (ref 0.61–1.24)
GFR calc Af Amer: 55 mL/min — ABNORMAL LOW (ref >60)
GFR calc non Af Amer: 47 mL/min — ABNORMAL LOW (ref >60)
Glucose, Bld: 84 mg/dL (ref 70–99)
Potassium: 4.9 mmol/L (ref 3.5–5.1)
Sodium: 134 mmol/L — ABNORMAL LOW (ref 135–145)

## 2019-06-21 LAB — CBC
HCT: 26.2 % — ABNORMAL LOW (ref 39.0–52.0)
Hemoglobin: 8.9 g/dL — ABNORMAL LOW (ref 13.0–17.0)
MCH: 32.8 pg (ref 26.0–34.0)
MCHC: 34 g/dL (ref 30.0–36.0)
MCV: 96.7 fL (ref 80.0–100.0)
Platelets: 271 10*3/uL (ref 150–400)
RBC: 2.71 MIL/uL — ABNORMAL LOW (ref 4.22–5.81)
RDW: 14.7 % (ref 11.5–15.5)
WBC: 10.2 10*3/uL (ref 4.0–10.5)
nRBC: 0 % (ref 0.0–0.2)

## 2019-06-21 LAB — GLUCOSE, CAPILLARY: Glucose-Capillary: 92 mg/dL (ref 70–99)

## 2019-06-21 NOTE — Progress Notes (Signed)
Physical Therapy Treatment Patient Details Name: [name] MRN: [id number] DOB: 01/18/1934 Today's Date: 06/21/2019    History of Present Illness Pt is an 85 year old male admitted following a R side laproscopic nephrectomy.  PMH includes CA, arthritis, anxiety, CVD and CKD.    PT Comments    Pt is making gradual progress towards goals, however continues to be weak with any functional activity. Takes extended time for OOB mobility and needs hands on assist for all movement. High falls risk. Pt very hesitant to work with therapy as he says he hasn't had anything to eat due to nausea and feels weak from lack of nutrition. Changing recommendation to SNF at this time due to need for further therapy. Will continue to progress.   Follow Up Recommendations  SNF     Equipment Recommendations  None recommended by PT    Recommendations for Other Services       Precautions / Restrictions Precautions Precautions: Fall Restrictions Weight Bearing Restrictions: No    Mobility  Bed Mobility Overal bed mobility: Needs Assistance Bed Mobility: Supine to Sit;Sit to Supine     Supine to sit: Mod assist Sit to supine: Mod assist;+2 for physical assistance   General bed mobility comments: needs increased time to complete activity. Has difficulty scooting towards EOB.  Transfers Overall transfer level: Needs assistance Equipment used: Rolling walker (2 wheeled) Transfers: Sit to/from Stand Sit to Stand: Mod assist         General transfer comment: takes 2 attempts to stand with rocking momentum required. Very effortful to stand and finally able to achieve knee extension. Fatigues quickly. Not safe to ambulate to recliner. Bed elevated prior to standing  Ambulation/Gait             General Gait Details: not safe due to weakness   Stairs             Wheelchair Mobility    Modified Rankin (Stroke Patients Only)       Balance Overall balance assessment: Needs  assistance Sitting-balance support: Feet supported;Bilateral upper extremity supported Sitting balance-[name]: Fair     Standing balance support: Bilateral upper extremity supported Standing balance-[name]: Fair                              Cognition Arousal/Alertness: Awake/alert Behavior During Therapy: WFL for tasks assessed/performed Overall Cognitive Status: Within Functional Limits for tasks assessed                                        Exercises      General Comments        Pertinent Vitals/Pain Pain Assessment: No/denies pain    Home Living                      Prior Function            PT Goals (current goals can now be found in the care plan section) Acute Rehab PT Goals Patient Stated Goal: To return to household ambulation with RW. PT Goal Formulation: With patient Time For Goal Achievement: 07/02/19 Potential to Achieve Goals: Good Progress towards PT goals: Progressing toward goals    Frequency    Min 2X/week      PT Plan Discharge plan needs to be updated    Co-evaluation  AM-PAC PT "6 Clicks" Mobility   Outcome Measure  Help needed turning from your back to your side while in a flat bed without using bedrails?: A Lot Help needed moving from lying on your back to sitting on the side of a flat bed without using bedrails?: A Lot Help needed moving to and from a bed to a chair (including a wheelchair)?: A Lot Help needed standing up from a chair using your arms (e.g., wheelchair or bedside chair)?: A Lot Help needed to walk in hospital room?: Total Help needed climbing 3-5 steps with a railing? : Total 6 Click Score: 10    End of Session Equipment Utilized During Treatment: Gait belt Activity Tolerance: Patient limited by fatigue Patient left: in bed;with bed alarm set;with call bell/phone within reach Nurse Communication: Mobility status PT Visit Diagnosis: Unsteadiness  on feet (R26.81);Muscle weakness (generalized) (M62.81);Difficulty in walking, not elsewhere classified (R26.2)     Time: 6553-7482 PT Time Calculation (min) (ACUTE ONLY): 31 min  Charges:  $Therapeutic Activity: 23-37 mins                     Greggory Stallion, PT, DPT [phone]    Andrea Ferrer 06/21/2019, 4:53 PM

## 2019-06-21 NOTE — TOC Initial Note (Signed)
Transition of Care [facility]) - Initial/Assessment Note    Patient Details  Name: [name] MRN: [id number] Date of Birth: 06/11/1934  Transition of [facility]) CM/SW Contact:    Beverly Sessions, RN Phone Number: 06/21/2019, 4:59 PM  Clinical Narrative:                 Patient POD4 from right hand-assisted laparoscopic nephrectomy   Patient states that he lives at home with his wife and daughter.   PCP [employer]  Daughter provides transportation.  Denies issues obtaining medications  Patient states that he has a RW and BSC in the home.   RNCM was in the patient's room at the same times as PT.  Initially patient was reluctant to work with PT, however eventually agreeable.  Patient states he already has a therapist coming out to the home from [facility] and he is very pleased with them. MD has entered orders for home health PT.  RNCM reached out to brittany with [facility].  She states that patient was actually closed in June.  Referral placed  PT continuing to work with patient at the time I left  The room  Expected Discharge Plan: [place]     Patient Goals and CMS Choice     Choice offered to / list presented to : Patient  Expected Discharge Plan and Services Expected Discharge Plan: [place]   Discharge Planning Services: CM Consult Post Acute Care Choice: [place] arrangements for the past 2 months: [place]: PT [facility] Agency: [facility] Date [place]: 06/21/19 Time [place]: [place] Representative spoke with at [place]: [place] Arrangements/Services Living arrangements for the past 2 months: [place] with:: Adult Children, Spouse Patient language and need for interpreter reviewed:: Yes Do you feel safe going back to the place where you live?: Yes      Need for Family Participation in Patient  Care: Yes (Comment) Care giver support system in place?: Yes (comment) Current home services: DME Criminal Activity/Legal Involvement Pertinent to Current Situation/Hospitalization: No - Comment as needed  Activities of Daily Living Home Assistive Devices/Equipment: [place]ford Rile (specify type) ADL Screening (condition at time of admission) Patient's cognitive ability adequate to safely complete daily activities?: Yes Is the patient deaf or have difficulty hearing?: Yes Does the patient have difficulty seeing, even when wearing glasses/contacts?: No Does the patient have difficulty concentrating, remembering, or making decisions?: No Patient able to express need for assistance with ADLs?: Yes Does the patient have difficulty dressing or bathing?: Yes Independently performs ADLs?: No Communication: Independent Dressing (OT): Needs assistance Is this a change from baseline?: Pre-admission baseline Grooming: Needs assistance Is this a change from baseline?: Pre-admission baseline Feeding: Independent Bathing: Needs assistance Is this a change from baseline?: Change from baseline, expected to last <3 days Toileting: Needs assistance Is this a change from baseline?: Pre-admission baseline In/Out Bed: Needs assistance Is this a change from baseline?: Pre-admission baseline Walks in Home: Needs assistance Is this a change from baseline?: Pre-admission baseline Does the patient have difficulty walking or climbing stairs?: Yes Weakness of Legs: Both Weakness of Arms/Hands: Both  Permission Sought/Granted  Emotional Assessment Appearance:: Appears stated age     Orientation: : Oriented to Self, Oriented to Place, Oriented to  Time, Oriented to Situation   Psych Involvement: No (comment)  Admission diagnosis:  right renal mass Patient Active Problem List   Diagnosis Date Noted  . Sore throat 06/07/2019  . Paroxysmal atrial fibrillation ([place]) 05/09/2019  .  Persistent atrial fibrillation   . Dental abscess 11/02/2018  . Chronic constipation 07/25/2018  . Right renal mass 07/25/2018  . TIA (transient ischemic attack) 06/06/2018  . Carotid artery disease ([place]) 06/06/2018  . Expressive aphasia 05/25/2018  . Complicated UTI (urinary tract infection) 10/18/2017  . SIADH (syndrome of inappropriate ADH production) ([place]) 04/17/2017  . Hyponatremia 01/16/2017  . UTI (urinary tract infection) 12/19/2016  . Asthma with acute exacerbation 11/14/2016  . Mood disorder ([place]) 01/18/2016  . Presence of indwelling urinary catheter 08/20/2015  . Advanced directives, counseling/discussion 07/28/2014  . Routine general medical examination at a health care facility 04/09/2012  . History of prostate cancer 04/09/2012  . Venous stasis dermatitis 06/13/2011  . Chronic sinusitis   . CONSTIPATION, CHRONIC 12/07/2010  . Hyperlipemia 12/02/2010  . OSTEOARTHRITIS 12/02/2010  . NEUROGENIC BLADDER 05/24/2010  . IBS 05/05/2010  . PERSONAL HISTORY OF COLONIC POLYPS 05/05/2010  . NEUROPATHY 05/03/2010  . Essential hypertension, benign 03/25/2009  . GAD (generalized anxiety disorder) 01/27/2007  . Coronary atherosclerosis of native coronary artery 01/27/2007  . Allergic asthma 01/27/2007  . GERD 01/27/2007  . Chronic rheumatic arthritis ([place]) 01/27/2007   PCP:  [name], MD Pharmacy:   [facility] [id number], [place] [address] Phone: [phone] Fax: [phone]     Social Determinants of Health (SDOH) Interventions    Readmission Risk Interventions No flowsheet data found.

## 2019-06-21 NOTE — Progress Notes (Signed)
Urology Inpatient Progress Note  Subjective: [name] is a 85 y.o. male who is POD4 from right hand-assisted laparoscopic nephrectomy with Dr. Erlene Quan for right renal mass. Postoperative course has been complicated by intermittent hypotension, limited mobility and a post-operative ileus.     Today, patient has stated he has passed flatus and had a BM.  He is wanting to have a drink.  He feels that his abdomen is not as tender this am and feels softer to him.    Incentive spirometry is still not at the bedside. Patient is lying supine in bed and asks for assistance to roll to his side and ambulate. Evaluated by PT and recommends in home PT when discharged.    Creatinine 1.37, WBC 10.2, Hgb 8.9 and HCT 26.2%.   Slightly tachycardic, afebrile.  Good UOP.  Urine clear yellow.    Anti-infectives: Anti-infectives (From admission, onward)   Start     Dose/Rate Route Frequency Ordered Stop   06/17/19 1600  ceFAZolin (ANCEF) IVPB 1 g/50 mL premix     1 g 100 mL/hr over 30 Minutes Intravenous Every 8 hours 06/17/19 1509 06/18/19 0010   06/17/19 0642  ceFAZolin (ANCEF) 2-4 GM/100ML-% IVPB    Note to Pharmacy: Ronnell Freshwater   : cabinet override      06/17/19 0642 06/17/19 0758   06/16/19 2212  ceFAZolin (ANCEF) IVPB 2g/100 mL premix     2 g 200 mL/hr over 30 Minutes Intravenous 30 min pre-op 06/16/19 2212 06/17/19 0758      Current Facility-Administered Medications  Medication Dose Route Frequency Provider Last Rate Last Dose  . 0.9 %  sodium chloride infusion   Intravenous Continuous Hollice Espy, MD 75 mL/hr at 06/21/19 0230    . acetaminophen (TYLENOL) tablet 650 mg  650 mg Oral Q4H PRN Hollice Espy, MD   650 mg at 06/19/19 1048  . acidophilus (RISAQUAD) capsule 1 capsule  1 capsule Oral Daily Hollice Espy, MD   1 capsule at 06/20/19 0856  . albuterol (PROVENTIL) (2.5 MG/3ML) 0.083% nebulizer solution 2.5 mg  2.5 mg Inhalation Q6H PRN Hollice Espy, MD      . ALPRAZolam Duanne Moron)  tablet 0.5 mg  0.5 mg Oral BID PRN Hollice Espy, MD      . ALPRAZolam Duanne Moron) tablet 1 mg  1 mg Oral QHS PRN Hollice Espy, MD   1 mg at 06/19/19 2312  . calcium carbonate (TUMS - dosed in mg elemental calcium) chewable tablet 200 mg of elemental calcium  1 tablet Oral Q6H PRN Hollice Espy, MD   200 mg of elemental calcium at 06/20/19 0241  . diphenhydrAMINE (BENADRYL) injection 12.5 mg  12.5 mg Intravenous Q6H PRN Hollice Espy, MD   12.5 mg at 06/20/19 0555   Or  . diphenhydrAMINE (BENADRYL) 12.5 MG/5ML elixir 12.5 mg  12.5 mg Oral Q6H PRN Hollice Espy, MD      . docusate sodium (COLACE) capsule 100 mg  100 mg Oral BID Hollice Espy, MD   100 mg at 06/20/19 0856  . fluticasone (FLONASE) 50 MCG/ACT nasal spray 2 spray  2 spray Each Nare BID Hollice Espy, MD   2 spray at 06/20/19 2139  . furosemide (LASIX) tablet 20 mg  20 mg Oral Daily Hollice Espy, MD   20 mg at 06/19/19 0813  . gabapentin (NEURONTIN) capsule 300 mg  300 mg Oral TID Hollice Espy, MD   300 mg at 06/20/19 0855  . heparin injection 5,000 Units  5,000 Units Subcutaneous  Q8H [place], MD   5,000 Units at 06/21/19 [phone]  . metoprolol tartrate (LOPRESSOR) tablet 25 mg  25 mg Oral BID Hollice Espy, MD   25 mg at 06/19/19 2140  . mirabegron ER (MYRBETRIQ) tablet 50 mg  50 mg Oral Daily Hollice Espy, MD   50 mg at 06/20/19 0855  . morphine 2 MG/ML injection 2-4 mg  2-4 mg Intravenous Q2H PRN Hollice Espy, MD      . ondansetron [facility]) injection 4 mg  4 mg Intravenous Q4H PRN Hollice Espy, MD   4 mg at 06/21/19 0143  . opium-belladonna (B&O SUPPRETTES) 16.2-60 MG suppository 1 suppository  1 suppository Rectal Q6H PRN Hollice Espy, MD      . oxyCODONE-acetaminophen (PERCOCET/ROXICET) 5-325 MG per tablet 1-2 tablet  1-2 tablet Oral Q4H PRN Hollice Espy, MD   1 tablet at 06/20/19 0006  . polyethylene glycol (MIRALAX / GLYCOLAX) packet 17 g  17 g Oral QHS Hollice Espy, MD   17 g at 06/19/19  2135  . pravastatin (PRAVACHOL) tablet 20 mg  20 mg Oral Daily Hollice Espy, MD   20 mg at 06/20/19 0856  . sodium chloride tablet 1 g  1 g Oral BID WC Hollice Espy, MD   1 g at 06/20/19 0855     Objective: Vital signs in last 24 hours: Temp:  [98 F (36.7 C)-98.7 F (37.1 C)] 98 F (36.7 C) (07/24 0628) Pulse Rate:  [100-116] 103 (07/24 0628) Resp:  [18-20] 20 (07/24 0628) BP: (95-127)/(49-70) 124/49 (07/24 0628) SpO2:  [90 %-96 %] 90 % (07/24 0628)  Intake/Output from previous day: 07/23 0701 - 07/24 0700 In: 1633.4 [P.O.:100; I.V.:1533.4] Out: 925 [Urine:775; Emesis/NG output:150] Intake/Output this shift: No intake/output data recorded. Constitutional:  Well nourished. Alert and oriented, No acute distress. HEENT: [place] AT, dry mucus membranes.  Trachea midline, no masses. Cardiovascular: No clubbing, cyanosis, or edema. Respiratory: Normal respiratory effort, no increased work of breathing. GI: Abdomen is soft, non tender, non distended, no abdominal masses.  Bruising at his incisions without erythema or drainage at site.   GU: No CVA tenderness.  No flank ecchymosis.  No bladder fullness or masses. Foley in place.  Draining clear yellow urine.   Neurologic: Grossly intact, no focal deficits, moving all 4 extremities. Psychiatric: Normal mood and affect.  Lab Results:  Recent Labs    06/20/19 1414 06/21/19 0249  WBC 10.7* 10.2  HGB 9.9* 8.9*  HCT 29.0* 26.2*  PLT 246 271   BMET Recent Labs    06/20/19 1414 06/21/19 0249  NA 131* 134*  K 4.9 4.9  CL 94* 98  CO2 25 23  GLUCOSE 90 84  BUN 25* 36*  CREATININE 1.28* 1.37*  CALCIUM 8.1* 8.0*   Studies/Results: Dg Abd 1 View  Result Date: 06/20/2019 CLINICAL DATA:  Vomiting, nausea, history of prostate cancer, hypertension, GERD, CHF, CAD, asthma, anemia EXAM: ABDOMEN - 1 VIEW COMPARISON:  04/23/2010, CT of the abdomen and pelvis on 05/21/2019 FINDINGS: Portions of the abdomen are excluded. There has been  interval development of significant small bowel dilatation. A small amount of gas persists within the colon but the colon is not dilated. Multiple surgical clips are identified within the pelvis. IMPRESSION: Interval development of small bowel obstruction. Recommend further evaluation with CT of the abdomen and pelvis with intravenous and oral contrast. These results will be called to the ordering clinician or representative by the Radiologist Assistant, and communication documented in the PACS or  zVision Dashboard. Electronically Signed   By: Nolon Nations M.D.   On: 06/20/2019 11:42   I personally reviewed the images above and disagree: based on clinical presentation, image is consistent with postoperative ileus and does not require further imaging at this time.  Assessment & Plan: 1. S/p hand assisted laparoscopic right nephrectomy POD #4 Patient with postop ileus, as supported by KUB and clinical presentation.  Has passed gas and had a BM.  Will place patient on clear liquid diet at this time, continue to monitor for flatus  Continue to promote ambulation, avoidance of opioids, and use of laxative suppositories to encourage bowel activity. Recommend use of incentive spirometry to address postoperative shortness of breath.  - Encourage incentive spirometry - Encourage ambulation - Encourage acetaminophen over oxycodone for pain control - Dulcolax suppository to promote BM, flatus - Restart IVF, 75cc/hour - Advance diet to clears once patient achieves BM and/or flatus - Continue heparin   Ginelle Bays, PA-C 06/21/2019

## 2019-06-22 LAB — BASIC METABOLIC PANEL
Anion gap: 7 (ref 5–15)
BUN: 27 mg/dL — ABNORMAL HIGH (ref 8–23)
CO2: 25 mmol/L (ref 22–32)
Calcium: 7.4 mg/dL — ABNORMAL LOW (ref 8.9–10.3)
Chloride: 100 mmol/L (ref 98–111)
Creatinine, Ser: 0.87 mg/dL (ref 0.61–1.24)
GFR calc Af Amer: >60 mL/min (ref >60)
GFR calc non Af Amer: >60 mL/min (ref >60)
Glucose, Bld: 100 mg/dL — ABNORMAL HIGH (ref 70–99)
Potassium: 4.1 mmol/L (ref 3.5–5.1)
Sodium: 132 mmol/L — ABNORMAL LOW (ref 135–145)

## 2019-06-22 LAB — CBC
HCT: 23.2 % — ABNORMAL LOW (ref 39.0–52.0)
Hemoglobin: 7.7 g/dL — ABNORMAL LOW (ref 13.0–17.0)
MCH: 32.1 pg (ref 26.0–34.0)
MCHC: 33.2 g/dL (ref 30.0–36.0)
MCV: 96.7 fL (ref 80.0–100.0)
Platelets: 188 10*3/uL (ref 150–400)
RBC: 2.4 MIL/uL — ABNORMAL LOW (ref 4.22–5.81)
RDW: 14.6 % (ref 11.5–15.5)
WBC: 4.7 10*3/uL (ref 4.0–10.5)
nRBC: 0 % (ref 0.0–0.2)

## 2019-06-22 LAB — HEMOGLOBIN AND HEMATOCRIT, BLOOD
HCT: 25.5 % — ABNORMAL LOW (ref 39.0–52.0)
Hemoglobin: 8.5 g/dL — ABNORMAL LOW (ref 13.0–17.0)

## 2019-06-22 NOTE — Progress Notes (Signed)
Pts BP 104/50 this am HR 97 x'. MD Eskridge made aware and per MD OK to give metoprolol and lasix scheduled. Resident refuses lasix.

## 2019-06-22 NOTE — Progress Notes (Signed)
Physical Therapy Treatment Patient Details Name: [name] MRN: [id number] DOB: December 22, 1933 Today's Date: 06/22/2019    History of Present Illness Pt is an 85 year old male admitted following a R side laproscopic nephrectomy.  PMH includes CA, arthritis, anxiety, CVD and CKD.    PT Comments    Pt in recliner.  RN in stated BP is "soft."  Pt requesting back to bed due to soreness in bottom from sitting in chair.  Attempted to stand with +1 assist but pt putting in little effort and unable to stand.  +2 assist called and he was able to stand with min a x 2 and transfer to bed.  Mod a to return to supine for LE's.  Participated in exercises as described below.  Discussed discharge plan.  Pt is firm about not wanting to go to rehab upon discharge.  "It's a death sentence."  Stating he will return home with his daughter as primary caregiver and his wife.  Pt has a walker and lift chair at home.  Stated he does not have a wheelchair.  He stated he was able to walk around unit with nursing staff yesterday but last flow sheet documented gait was 7/22 where he walked 160'.  Pt has been unable to walk in therapy sessions.  He may benefit from a wheelchair upon discharge if he continues to decline SNF along with HHPT.   Patient suffers from weakness and arthritis which impairs his/her ability to perform daily activities like toileting, feeding, dressing, grooming, bathing in the home. A cane, walker, crutch will not resolve the patient's issue with performing activities of daily living. A lightweight wheelchair is required/recommended and will allow patient to safely perform daily activities.   Patient can safely propel the wheelchair in the home or has a caregiver who can provide assistance.    Follow Up Recommendations  SNF     Equipment Recommendations  Wheelchair (measurements PT);Wheelchair cushion (measurements PT)    Recommendations for Other Services       Precautions / Restrictions  Precautions Precautions: Fall Restrictions Weight Bearing Restrictions: No    Mobility  Bed Mobility Overal bed mobility: Needs Assistance Bed Mobility: Sit to Supine       Sit to supine: Mod assist   General bed mobility comments: for LE"S  Transfers Overall transfer level: Needs assistance Equipment used: Rolling walker (2 wheeled) Transfers: Sit to/from Stand Sit to Stand: Min assist;+2 physical assistance            Ambulation/Gait Ambulation/Gait assistance: Min assist;+2 physical assistance Gait Distance (Feet): 3 Feet Assistive device: Rolling walker (2 wheeled) Gait Pattern/deviations: Step-to pattern         Stairs             Wheelchair Mobility    Modified Rankin (Stroke Patients Only)       Balance Overall balance assessment: Needs assistance Sitting-balance support: Feet supported;Bilateral upper extremity supported Sitting balance-[name]: Fair     Standing balance support: Bilateral upper extremity supported Standing balance-[name]: Fair                              Cognition Arousal/Alertness: Awake/alert Behavior During Therapy: WFL for tasks assessed/performed Overall Cognitive Status: Within Functional Limits for tasks assessed  General Comments: Follows directions consistently. talkative      Exercises Other Exercises Other Exercises: supine ankle pumps, heel slides, ab/add and SLR 2 x 10    General Comments        Pertinent Vitals/Pain Pain Assessment: Faces Faces Pain Scale: Hurts a little bit Pain Location: incision site Pain Descriptors / Indicators: Sore Pain Intervention(s): Limited activity within patient's tolerance;Monitored during session    Home Living                      Prior Function            PT Goals (current goals can now be found in the care plan section) Progress towards PT goals: Progressing toward goals     Frequency    Min 2X/week      PT Plan Current plan remains appropriate;Other (comment)    Co-evaluation              AM-PAC PT "6 Clicks" Mobility   Outcome Measure  Help needed turning from your back to your side while in a flat bed without using bedrails?: A Lot Help needed moving from lying on your back to sitting on the side of a flat bed without using bedrails?: A Lot Help needed moving to and from a bed to a chair (including a wheelchair)?: A Lot Help needed standing up from a chair using your arms (e.g., wheelchair or bedside chair)?: A Lot Help needed to walk in hospital room?: Total Help needed climbing 3-5 steps with a railing? : Total 6 Click Score: 10    End of Session Equipment Utilized During Treatment: Gait belt Activity Tolerance: Patient limited by fatigue;Other (comment) Patient left: in bed;with bed alarm set;with call bell/phone within reach Nurse Communication: Mobility status       Time: 4166-0630 PT Time Calculation (min) (ACUTE ONLY): 29 min  Charges:  $Therapeutic Exercise: 8-22 mins $Therapeutic Activity: 8-22 mins                     Chesley Noon, PTA 06/22/19, 3:08 PM

## 2019-06-22 NOTE — Progress Notes (Signed)
5 Days Post-Op Subjective: Patient reports he feels weak. Difficulty walking yesterday, but he has had good flatus, no nausea or emesis. Tolerating clear.   Objective: Vital signs in last 24 hours: Temp:  [98 F (36.7 C)-98.1 F (36.7 C)] 98 F (36.7 C) (07/25 0650) Pulse Rate:  [73-98] 98 (07/25 0650) Resp:  [20] 20 (07/25 0650) BP: (107-120)/(45-57) 109/45 (07/25 0650) SpO2:  [87 %-96 %] 87 % (07/25 0650)  Intake/Output from previous day: 07/24 0701 - 07/25 0700 In: 2271.6 [P.O.:720; I.V.:1551.6] Out: 550 [Urine:550] Intake/Output this shift: No intake/output data recorded.  Physical Exam:  NAD Watching TV Alert and oriented  CV - RRR Abd - soft, NT, incision C/D/I, ND Ext - no calf pain or swelling    Lab Results: Recent Labs    06/20/19 1414 06/21/19 0249 06/22/19 0535  HGB 9.9* 8.9* 7.7*  HCT 29.0* 26.2* 23.2*   BMET Recent Labs    06/21/19 0249 06/22/19 0535  NA 134* 132*  K 4.9 4.1  CL 98 100  CO2 23 25  GLUCOSE 84 100*  BUN 36* 27*  CREATININE 1.37* 0.87  CALCIUM 8.0* 7.4*   No results for input(s): LABPT, INR in the last 72 hours. No results for input(s): LABURIN in the last 72 hours. Results for orders placed or performed during the hospital encounter of 06/13/19  SARS Coronavirus 2 (Performed in [place] hospital lab)     Status: None   Collection Time: 06/13/19  2:49 PM   Specimen: Nasal Swab  Result Value Ref Range Status   SARS Coronavirus 2 NEGATIVE NEGATIVE Final    Comment: (NOTE) SARS-CoV-2 target nucleic acids are NOT DETECTED. The SARS-CoV-2 RNA is generally detectable in upper and lower respiratory specimens during the acute phase of infection. Negative results do not preclude SARS-CoV-2 infection, do not rule out co-infections with other pathogens, and should not be used as the sole basis for treatment or other patient management decisions. Negative results must be combined with clinical observations, patient history, and  epidemiological information. The expected result is Negative. Fact Sheet for Patients: SugarRoll.be Fact Sheet for Healthcare Providers: https://www.woods-mathews.com/ This test is not yet approved or cleared by the [place] FDA and  has been authorized for detection and/or diagnosis of SARS-CoV-2 by FDA under an Emergency Use Authorization (EUA). This EUA will remain  in effect (meaning this test can be used) for the duration of the COVID-19 declaration under Section 56 4(b)(1) of the Act, 21 U.S.C. section 360bbb-3(b)(1), unless the authorization is terminated or revoked sooner. Performed at [facility], [place] [address], [place], [place] [address]    *Note: Due to a large number of results and/or encounters for the requested time period, some results have not been displayed. A complete set of results can be found in Results Review.    Studies/Results: Dg Abd 1 View  Result Date: 06/20/2019 CLINICAL DATA:  Vomiting, nausea, history of prostate cancer, hypertension, GERD, CHF, CAD, asthma, anemia EXAM: ABDOMEN - 1 VIEW COMPARISON:  04/23/2010, CT of the abdomen and pelvis on 05/21/2019 FINDINGS: Portions of the abdomen are excluded. There has been interval development of significant small bowel dilatation. A small amount of gas persists within the colon but the colon is not dilated. Multiple surgical clips are identified within the pelvis. IMPRESSION: Interval development of small bowel obstruction. Recommend further evaluation with CT of the abdomen and pelvis with intravenous and oral contrast. These results will be called to the ordering clinician or  representative by the Radiologist Assistant, and communication documented in the PACS or zVision Dashboard. Electronically Signed   By: Nolon Nations M.D.   On: 06/20/2019 11:42    Assessment/Plan: POD# 5 RHA lap Nx -  -Hgb down 7.7 - check H/H after about 8 hrs - discussed with  patient the nature r/b/a to 1U PRBC and he would get txfn if indicated. He had blood txfn after knee replacement he tells me.  -kidney function good  -ileus resolving - advance to soft  -cont PT, ambulate  -foley - urine clear, Cr normal    LOS: 5 days   Festus Aloe 06/22/2019, 9:31 AM

## 2019-06-23 MED ORDER — MAGNESIUM CITRATE PO SOLN: 0.5000 | Freq: Once | ORAL | 0 refills | Status: AC

## 2019-06-23 MED ORDER — TRAMADOL HCL 50 MG PO TABS: 50.0000 mg | ORAL_TABLET | Freq: Four times a day (QID) | ORAL | 0 refills | Status: DC | PRN

## 2019-06-23 MED ORDER — METOPROLOL TARTRATE 25 MG PO TABS: 12.5000 mg | ORAL_TABLET | Freq: Two times a day (BID) | ORAL | 3 refills | Status: DC

## 2019-06-23 NOTE — TOC Transition Note (Signed)
Transition of Care [facility]) - CM/SW Discharge Note   Patient Details  Name: DEMETRIE BORGE MRN: [id number] Date of Birth: September 13, 1934  [facility]) CM/SW Contact:  Latanya Maudlin, RN Phone Number: 06/23/2019, 11:12 AM   Clinical Narrative:  Patient to be discharged per MD order. Orders in place for home health services. Patient active with [facility] notified Tillie Rung of discharge. Patient also in need of rolling walker. DME order in place. Obtained from [employer], delivered to bedside. Daughter to transport.      Final next level of care: Home w Home Health Services Barriers to Discharge: No Barriers Identified   Patient Goals and CMS Choice   CMS Medicare.gov Compare Post Acute Care list provided to:: Patient Choice offered to / list presented to : Patient  Discharge Placement                       Discharge Plan and Services   Discharge Planning Services: CM Consult Post Acute Care Choice: Home Health          DME Arranged: Wheelchair manual DME Agency: [employer] Date DME Agency Contacted: 06/23/19 Time DME Agency Contacted: [phone] Representative spoke with at DME Agency: [place]: PT [place]: [place] ([place]) Date [place]: 06/23/19 Time [place]: 1111 Representative spoke with at [place]: [place] ([place]) Interventions     Readmission Risk Interventions Readmission Risk Prevention Plan 06/23/2019  Transportation Screening Complete  PCP or Specialist Appt within 3-5 Days Complete  HRI or [place] Complete  Social Work Consult for [place] Planning/Counseling Complete  Palliative Care Screening Not Applicable  Medication Review Press photographer) Complete  Some recent data might be hidden

## 2019-06-23 NOTE — Progress Notes (Signed)
6 Days Post-Op Subjective: No pain. Eating without nausea Anxious about the proposition of being discharged home, but feeling ready  Intv: Last BM on 7/24. Repeat Hgb 8.5 from 7.7 Worked with PT yesterday and they recommended discharge home with wheelchair. Refused to take his Metoprolol yesterday. No acute events.  Objective: Vital signs in last 24 hours: Temp:  [98.8 F (37.1 C)-99.3 F (37.4 C)] 98.9 F (37.2 C) (07/26 0435) Pulse Rate:  [86-87] 87 (07/26 0438) Resp:  [18] 18 (07/26 0435) BP: (99-110)/(40-50) 110/43 (07/26 0438) SpO2:  [95 %-97 %] 95 % (07/26 0435)  Intake/Output from previous day: 07/25 0701 - 07/26 0700 In: 240 [P.O.:240] Out: 350 [Urine:350] Intake/Output this shift: No intake/output data recorded.  Physical Exam:  NAD Watching TV Alert and oriented  CV - RRR Abd - soft, NT, incision C/D/I, ND, bruising around incisions (resolving) Ext - no calf pain or swelling  Foley draining clear yellow urine.  Lab Results: Recent Labs    06/21/19 0249 06/22/19 0535 06/22/19 1456  HGB 8.9* 7.7* 8.5*  HCT 26.2* 23.2* 25.5*   BMET Recent Labs    06/21/19 0249 06/22/19 0535  NA 134* 132*  K 4.9 4.1  CL 98 100  CO2 23 25  GLUCOSE 84 100*  BUN 36* 27*  CREATININE 1.37* 0.87  CALCIUM 8.0* 7.4*   No results for input(s): LABPT, INR in the last 72 hours. No results for input(s): LABURIN in the last 72 hours. Results for orders placed or performed during the hospital encounter of 06/13/19  SARS Coronavirus 2 (Performed in [place] hospital lab)     Status: None   Collection Time: 06/13/19  2:49 PM   Specimen: Nasal Swab  Result Value Ref Range Status   SARS Coronavirus 2 NEGATIVE NEGATIVE Final    Comment: (NOTE) SARS-CoV-2 target nucleic acids are NOT DETECTED. The SARS-CoV-2 RNA is generally detectable in upper and lower respiratory specimens during the acute phase of infection. Negative results do not preclude SARS-CoV-2 infection, do  not rule out co-infections with other pathogens, and should not be used as the sole basis for treatment or other patient management decisions. Negative results must be combined with clinical observations, patient history, and epidemiological information. The expected result is Negative. Fact Sheet for Patients: SugarRoll.be Fact Sheet for Healthcare Providers: https://www.woods-mathews.com/ This test is not yet approved or cleared by the [place] FDA and  has been authorized for detection and/or diagnosis of SARS-CoV-2 by FDA under an Emergency Use Authorization (EUA). This EUA will remain  in effect (meaning this test can be used) for the duration of the COVID-19 declaration under Section 56 4(b)(1) of the Act, 21 U.S.C. section 360bbb-3(b)(1), unless the authorization is terminated or revoked sooner. Performed at [facility], [place] [address] [place], [place] [address]    *Note: Due to a large number of results and/or encounters for the requested time period, some results have not been displayed. A complete set of results can be found in Results Review.    Studies/Results: No results found.  Assessment/Plan: POD# 6 RHA lap Nx  For clear cell carcinoma.  Stable for discharge.    LOS: 6 days   Ardis Hughs 06/23/2019, 10:47 AM

## 2019-06-23 NOTE — Discharge Instructions (Signed)
·   Activity:  You are encouraged to ambulate frequently (about every hour during waking hours) to help prevent blood clots from forming in your legs or lungs.  However, you should not engage in any heavy lifting (> 10-15 lbs), strenuous activity, or straining.   Diet: You should advance your diet as instructed by your physician.  It will be normal to have some bloating, nausea, and abdominal discomfort intermittently.   Prescriptions:  You will be provided a prescription for pain medication to take as needed.  If your pain is not severe enough to require the prescription pain medication, you may take extra strength Tylenol instead which will have less side effects.  You should also take a prescribed stool softener to avoid straining with bowel movements as the prescription pain medication may constipate you.   Incisions: You may remove your dressing bandages 48 hours after surgery if not removed in the hospital.  You will either have some small staples or special tissue glue at each of the incision sites. Once the bandages are removed (if present), the incisions may stay open to air.  You may start showering (but not soaking or bathing in water) the 2nd day after surgery and the incisions simply need to be patted dry after the shower.  No additional care is needed.   What to call us about: You should call the office ([phone])  if you develop fever > 101 or develop persistent vomiting. Activity:  You are encouraged to ambulate frequently (about every hour during waking hours) to help prevent blood clots from forming in your legs or lungs.  However, you should not engage in any heavy lifting (> 10-15 lbs), strenuous activity, or straining.

## 2019-06-24 DIAGNOSIS — I951 Orthostatic hypotension: Secondary | ICD-10-CM | POA: Diagnosis not present

## 2019-06-24 DIAGNOSIS — I6529 Occlusion and stenosis of unspecified carotid artery: Secondary | ICD-10-CM | POA: Diagnosis not present

## 2019-06-24 DIAGNOSIS — I10 Essential (primary) hypertension: Secondary | ICD-10-CM | POA: Diagnosis not present

## 2019-06-24 DIAGNOSIS — K5909 Other constipation: Secondary | ICD-10-CM | POA: Diagnosis not present

## 2019-06-24 DIAGNOSIS — Z48816 Encounter for surgical aftercare following surgery on the genitourinary system: Secondary | ICD-10-CM | POA: Diagnosis not present

## 2019-06-24 DIAGNOSIS — J45909 Unspecified asthma, uncomplicated: Secondary | ICD-10-CM | POA: Diagnosis not present

## 2019-06-24 DIAGNOSIS — I872 Venous insufficiency (chronic) (peripheral): Secondary | ICD-10-CM | POA: Diagnosis not present

## 2019-06-24 DIAGNOSIS — I2510 Atherosclerotic heart disease of native coronary artery without angina pectoris: Secondary | ICD-10-CM | POA: Diagnosis not present

## 2019-06-24 DIAGNOSIS — I4819 Other persistent atrial fibrillation: Secondary | ICD-10-CM | POA: Diagnosis not present

## 2019-06-25 ENCOUNTER — Telehealth: Payer: Self-pay

## 2019-06-25 NOTE — Telephone Encounter (Signed)
Transition Care Management Follow-up Telephone Call   Date discharged? 06/23/19   How have you been since you were released from the hospital?   Patient states that he is doing fine, surgery was successful but he had some side effects from the metoprolol with hypotension and bowel issues.  He has had reduced dosing in the metoprolol which is helping.  He is weak and is having issues moving around, but he does have home PT which will start Thursday.    Do you understand why you were in the hospital? Yes   Do you understand the discharge instructions? Yes   Where were you discharged to? Home  Items Reviewed:  Medications reviewed: Yes  Allergies reviewed: Yes  Dietary changes reviewed: no changes  Referrals reviewed: Yes, has follow up with Urology (in august), has home health and is trying to get in with Dr. Holley Raring at the end of next week.    Functional Questionnaire:  Activities of Daily Living (ADLs):   He states they are independent in the following: eating, feeding, toileting, grooming. States they require assistance with the following: transferring, ambulating with assistive device.     Any transportation issues/concerns?: No   Any patient concerns? No concerns at this time.    Confirmed importance and date/time of follow-up visits scheduled Yes  Provider Appointment booked with Dr. Silvio Pate for 07/11/19 at 4pm.  Note, patient refuses sooner appointment.  He has follow ups with his specialists and due to weakness, refuses sooner appointment with MD.  Also, refused virtual appointment.   Confirmed with patient if condition begins to worsen call PCP or go to the ER.  Patient was given the office number and encouraged to call back with question or concerns.  : Yes

## 2019-06-25 NOTE — Telephone Encounter (Signed)
Patient thanks [place] and the Doctor for follow up call but refuses in office appointment before 07/11/19.  He also refused a virtual.    Patient states that he is generally weak and is working with PT and wants just to keep his f/u right now with his specialist and see [place] on 07/11/19.   FYI to PCP.  Hosp f/u made for 07/11/19 at 4:00pm.  Patient refuses sooner appointment.  He will call us if any questions, concerns or needs before then.

## 2019-06-26 ENCOUNTER — Telehealth: Payer: Self-pay

## 2019-06-26 NOTE — Telephone Encounter (Signed)
Patient and daughter called stating that he is not feeling well today very weak and fatigued with low urine output over the night. Only 269ml of dark orange urine in patient's bag. Patient denies fever, chills, hematuria, chest pain, SOB and incision sites look fine.  Per Dr. Diamantina Providence patient is most likely dehydrated and should increase fluid intake with half Gatorade half water. Patient and daughter verbalized understanding. Patient does note an increase of bladder spasms. He was instructed to keep taking myrbetriq to help with bladder spasms and urgency.

## 2019-06-26 NOTE — Telephone Encounter (Signed)
That is fine It just won't be official TCM then

## 2019-06-27 DIAGNOSIS — I10 Essential (primary) hypertension: Secondary | ICD-10-CM | POA: Diagnosis not present

## 2019-06-27 DIAGNOSIS — I872 Venous insufficiency (chronic) (peripheral): Secondary | ICD-10-CM | POA: Diagnosis not present

## 2019-06-27 DIAGNOSIS — I4819 Other persistent atrial fibrillation: Secondary | ICD-10-CM | POA: Diagnosis not present

## 2019-06-27 DIAGNOSIS — I951 Orthostatic hypotension: Secondary | ICD-10-CM | POA: Diagnosis not present

## 2019-06-27 DIAGNOSIS — Z48816 Encounter for surgical aftercare following surgery on the genitourinary system: Secondary | ICD-10-CM | POA: Diagnosis not present

## 2019-06-27 DIAGNOSIS — I6529 Occlusion and stenosis of unspecified carotid artery: Secondary | ICD-10-CM | POA: Diagnosis not present

## 2019-06-27 DIAGNOSIS — I2510 Atherosclerotic heart disease of native coronary artery without angina pectoris: Secondary | ICD-10-CM | POA: Diagnosis not present

## 2019-06-27 DIAGNOSIS — K5909 Other constipation: Secondary | ICD-10-CM | POA: Diagnosis not present

## 2019-06-27 DIAGNOSIS — J45909 Unspecified asthma, uncomplicated: Secondary | ICD-10-CM | POA: Diagnosis not present

## 2019-06-28 ENCOUNTER — Telehealth: Payer: Self-pay | Admitting: Urology

## 2019-06-28 NOTE — Telephone Encounter (Signed)
Pt LMOM and states that he would like a call back from Judson Roch to discuss his pain level from previous surgery. Please advise

## 2019-06-28 NOTE — Telephone Encounter (Signed)
Spoke with patient and he states he had more discomfort "dull muscle pain" after physical therapy today. It was explained that this is to be expected and should get better with time. Also spoke with patient's daughter and he is doing much better with hydration gaterade/water mix

## 2019-07-01 DIAGNOSIS — I6529 Occlusion and stenosis of unspecified carotid artery: Secondary | ICD-10-CM | POA: Diagnosis not present

## 2019-07-01 DIAGNOSIS — F411 Generalized anxiety disorder: Secondary | ICD-10-CM

## 2019-07-01 DIAGNOSIS — J45909 Unspecified asthma, uncomplicated: Secondary | ICD-10-CM | POA: Diagnosis not present

## 2019-07-01 DIAGNOSIS — G629 Polyneuropathy, unspecified: Secondary | ICD-10-CM

## 2019-07-01 DIAGNOSIS — N319 Neuromuscular dysfunction of bladder, unspecified: Secondary | ICD-10-CM

## 2019-07-01 DIAGNOSIS — I2510 Atherosclerotic heart disease of native coronary artery without angina pectoris: Secondary | ICD-10-CM | POA: Diagnosis not present

## 2019-07-01 DIAGNOSIS — Z96659 Presence of unspecified artificial knee joint: Secondary | ICD-10-CM

## 2019-07-01 DIAGNOSIS — I872 Venous insufficiency (chronic) (peripheral): Secondary | ICD-10-CM | POA: Diagnosis not present

## 2019-07-01 DIAGNOSIS — Z8673 Personal history of transient ischemic attack (TIA), and cerebral infarction without residual deficits: Secondary | ICD-10-CM

## 2019-07-01 DIAGNOSIS — K5909 Other constipation: Secondary | ICD-10-CM | POA: Diagnosis not present

## 2019-07-01 DIAGNOSIS — Z9181 History of falling: Secondary | ICD-10-CM

## 2019-07-01 DIAGNOSIS — F39 Unspecified mood [affective] disorder: Secondary | ICD-10-CM

## 2019-07-01 DIAGNOSIS — I10 Essential (primary) hypertension: Secondary | ICD-10-CM | POA: Diagnosis not present

## 2019-07-01 DIAGNOSIS — Z7901 Long term (current) use of anticoagulants: Secondary | ICD-10-CM

## 2019-07-01 DIAGNOSIS — I951 Orthostatic hypotension: Secondary | ICD-10-CM | POA: Diagnosis not present

## 2019-07-01 DIAGNOSIS — K219 Gastro-esophageal reflux disease without esophagitis: Secondary | ICD-10-CM

## 2019-07-01 DIAGNOSIS — M1990 Unspecified osteoarthritis, unspecified site: Secondary | ICD-10-CM

## 2019-07-01 DIAGNOSIS — E785 Hyperlipidemia, unspecified: Secondary | ICD-10-CM

## 2019-07-01 DIAGNOSIS — I4819 Other persistent atrial fibrillation: Secondary | ICD-10-CM | POA: Diagnosis not present

## 2019-07-01 DIAGNOSIS — M069 Rheumatoid arthritis, unspecified: Secondary | ICD-10-CM

## 2019-07-01 DIAGNOSIS — Z48816 Encounter for surgical aftercare following surgery on the genitourinary system: Secondary | ICD-10-CM | POA: Diagnosis not present

## 2019-07-03 DIAGNOSIS — I4819 Other persistent atrial fibrillation: Secondary | ICD-10-CM | POA: Diagnosis not present

## 2019-07-03 DIAGNOSIS — I6529 Occlusion and stenosis of unspecified carotid artery: Secondary | ICD-10-CM | POA: Diagnosis not present

## 2019-07-03 DIAGNOSIS — I951 Orthostatic hypotension: Secondary | ICD-10-CM | POA: Diagnosis not present

## 2019-07-03 DIAGNOSIS — K5909 Other constipation: Secondary | ICD-10-CM | POA: Diagnosis not present

## 2019-07-03 DIAGNOSIS — I872 Venous insufficiency (chronic) (peripheral): Secondary | ICD-10-CM | POA: Diagnosis not present

## 2019-07-03 DIAGNOSIS — I2510 Atherosclerotic heart disease of native coronary artery without angina pectoris: Secondary | ICD-10-CM | POA: Diagnosis not present

## 2019-07-03 DIAGNOSIS — J45909 Unspecified asthma, uncomplicated: Secondary | ICD-10-CM | POA: Diagnosis not present

## 2019-07-03 DIAGNOSIS — Z48816 Encounter for surgical aftercare following surgery on the genitourinary system: Secondary | ICD-10-CM | POA: Diagnosis not present

## 2019-07-03 DIAGNOSIS — I10 Essential (primary) hypertension: Secondary | ICD-10-CM | POA: Diagnosis not present

## 2019-07-04 ENCOUNTER — Other Ambulatory Visit: Payer: Medicare PPO

## 2019-07-04 DIAGNOSIS — N182 Chronic kidney disease, stage 2 (mild): Secondary | ICD-10-CM | POA: Diagnosis not present

## 2019-07-04 DIAGNOSIS — R600 Localized edema: Secondary | ICD-10-CM | POA: Diagnosis not present

## 2019-07-04 DIAGNOSIS — D649 Anemia, unspecified: Secondary | ICD-10-CM | POA: Diagnosis not present

## 2019-07-04 DIAGNOSIS — E871 Hypo-osmolality and hyponatremia: Secondary | ICD-10-CM | POA: Diagnosis not present

## 2019-07-04 NOTE — Progress Notes (Signed)
Tumor Board Documentation  RAYQUAN AMRHEIN was presented by Dr Erlene Quan at our Tumor Board on 07/04/2019, which included representatives from medical oncology, radiation oncology, pathology, radiology, surgical, navigation, internal medicine, research, palliative care.  Kazimir currently presents as a new patient, for [place], for new positive pathology with history of the following treatments: surgical intervention(s).  Additionally, we reviewed previous medical and familial history, history of present illness, and recent lab results along with all available histopathologic and imaging studies. The tumor board considered available treatment options and made the following recommendations: Active surveillance    The following procedures/referrals were also placed: No orders of the defined types were placed in this encounter.   Clinical Trial Status: not discussed   Staging used: AJCC Stage Group  AJCC Staging: T: 3a N: x   Group: Grade 2 Stage 3 Renal Cell Carcinoma Clear Cell type   National site-specific guidelines NCCN were discussed with respect to the case.  Tumor board is a meeting of clinicians from various specialty areas who evaluate and discuss patients for whom a multidisciplinary approach is being considered. Final determinations in the plan of care are those of the provider(s). The responsibility for follow up of recommendations given during tumor board is that of the provider.   Today's extended care, comprehensive team conference, Jacek was not present for the discussion and was not examined.   Multidisciplinary Tumor Board is a multidisciplinary case peer review process.  Decisions discussed in the Multidisciplinary Tumor Board reflect the opinions of the specialists present at the conference without having examined the patient.  Ultimately, treatment and diagnostic decisions rest with the primary provider(s) and the patient.

## 2019-07-05 ENCOUNTER — Telehealth: Payer: Self-pay | Admitting: Cardiovascular Disease

## 2019-07-05 NOTE — Telephone Encounter (Signed)
Returned call to daughter, [name], okay per DPR.   Daughter clarifying metoprolol dose. He is currently on 12.5 mg BID, as it was decrease in the hospital d/t hypotension with PT.   Per daughter the target BP > 110/60. He is meeting goal at S 110-115, DBP 60-61.   Daughter trying to confirm this is where Dr. Fletcher Anon would like the pt to stay. Pt has f/u 9/11.   She confirmed that pt is doing well. Strength has been good, no chest pain, dizziness or SOB.   I confirmed that [facility] reflects his current dose. I asked that they continue POC from d/c as long as he is doing well. If anything changes she will return call to clinic. Otherwise, they will f/u as scheduled.

## 2019-07-05 NOTE — Telephone Encounter (Signed)
Patients daughter calling in to clarify some dosage amounts of metoprolol from his hospital admission vs his prescribed dosage. Related to bp levels.  Please advise at [phone]

## 2019-07-08 DIAGNOSIS — I951 Orthostatic hypotension: Secondary | ICD-10-CM | POA: Diagnosis not present

## 2019-07-08 DIAGNOSIS — I872 Venous insufficiency (chronic) (peripheral): Secondary | ICD-10-CM | POA: Diagnosis not present

## 2019-07-08 DIAGNOSIS — H539 Unspecified visual disturbance: Secondary | ICD-10-CM | POA: Diagnosis not present

## 2019-07-08 DIAGNOSIS — K5909 Other constipation: Secondary | ICD-10-CM | POA: Diagnosis not present

## 2019-07-08 DIAGNOSIS — I6529 Occlusion and stenosis of unspecified carotid artery: Secondary | ICD-10-CM | POA: Diagnosis not present

## 2019-07-08 DIAGNOSIS — I4819 Other persistent atrial fibrillation: Secondary | ICD-10-CM | POA: Diagnosis not present

## 2019-07-08 DIAGNOSIS — J45909 Unspecified asthma, uncomplicated: Secondary | ICD-10-CM | POA: Diagnosis not present

## 2019-07-08 DIAGNOSIS — Z8673 Personal history of transient ischemic attack (TIA), and cerebral infarction without residual deficits: Secondary | ICD-10-CM | POA: Diagnosis not present

## 2019-07-08 DIAGNOSIS — I2510 Atherosclerotic heart disease of native coronary artery without angina pectoris: Secondary | ICD-10-CM | POA: Diagnosis not present

## 2019-07-08 DIAGNOSIS — I10 Essential (primary) hypertension: Secondary | ICD-10-CM | POA: Diagnosis not present

## 2019-07-08 DIAGNOSIS — Z48816 Encounter for surgical aftercare following surgery on the genitourinary system: Secondary | ICD-10-CM | POA: Diagnosis not present

## 2019-07-10 ENCOUNTER — Telehealth: Payer: Self-pay | Admitting: Urology

## 2019-07-10 DIAGNOSIS — I10 Essential (primary) hypertension: Secondary | ICD-10-CM | POA: Diagnosis not present

## 2019-07-10 DIAGNOSIS — Z48816 Encounter for surgical aftercare following surgery on the genitourinary system: Secondary | ICD-10-CM | POA: Diagnosis not present

## 2019-07-10 DIAGNOSIS — I6529 Occlusion and stenosis of unspecified carotid artery: Secondary | ICD-10-CM | POA: Diagnosis not present

## 2019-07-10 DIAGNOSIS — I4819 Other persistent atrial fibrillation: Secondary | ICD-10-CM | POA: Diagnosis not present

## 2019-07-10 DIAGNOSIS — J45909 Unspecified asthma, uncomplicated: Secondary | ICD-10-CM | POA: Diagnosis not present

## 2019-07-10 DIAGNOSIS — I2510 Atherosclerotic heart disease of native coronary artery without angina pectoris: Secondary | ICD-10-CM | POA: Diagnosis not present

## 2019-07-10 DIAGNOSIS — K5909 Other constipation: Secondary | ICD-10-CM | POA: Diagnosis not present

## 2019-07-10 DIAGNOSIS — I872 Venous insufficiency (chronic) (peripheral): Secondary | ICD-10-CM | POA: Diagnosis not present

## 2019-07-10 DIAGNOSIS — I951 Orthostatic hypotension: Secondary | ICD-10-CM | POA: Diagnosis not present

## 2019-07-10 NOTE — Telephone Encounter (Signed)
Please call his daughter and let her know that his hemoglobin actually is improving.  It was 8.5 at the time of discharge is 9.5 is where I would expect him to be and moving in the right direction.  Great news!  I would encourage eating iron rich foods at this point but it seems like he is building up his reserve again.  Hollice Espy, MD

## 2019-07-10 NOTE — Telephone Encounter (Signed)
Pt's daughter called to let you know pt went for nephrologist appt and his hemoglobin is 9.5.  She also states pt looks pale and is weak.  They have referred him to hematology.  She just wanted to make sure you knew.

## 2019-07-10 NOTE — Telephone Encounter (Signed)
Patient's daughter notified.

## 2019-07-11 ENCOUNTER — Ambulatory Visit (INDEPENDENT_AMBULATORY_CARE_PROVIDER_SITE_OTHER): Payer: Medicare PPO | Admitting: Internal Medicine

## 2019-07-11 ENCOUNTER — Telehealth: Payer: Self-pay | Admitting: Pulmonary Disease

## 2019-07-11 ENCOUNTER — Encounter: Payer: Self-pay | Admitting: Internal Medicine

## 2019-07-11 ENCOUNTER — Other Ambulatory Visit: Payer: Self-pay

## 2019-07-11 DIAGNOSIS — I480 Paroxysmal atrial fibrillation: Secondary | ICD-10-CM | POA: Diagnosis not present

## 2019-07-11 DIAGNOSIS — M0579 Rheumatoid arthritis with rheumatoid factor of multiple sites without organ or systems involvement: Secondary | ICD-10-CM | POA: Diagnosis not present

## 2019-07-11 DIAGNOSIS — C641 Malignant neoplasm of right kidney, except renal pelvis: Secondary | ICD-10-CM | POA: Diagnosis not present

## 2019-07-11 DIAGNOSIS — C649 Malignant neoplasm of unspecified kidney, except renal pelvis: Secondary | ICD-10-CM | POA: Insufficient documentation

## 2019-07-11 NOTE — Telephone Encounter (Signed)
Xolair Prefilled Syringe Order: 150mg  Prefilled Syringe:  #4 75mg  Prefilled Syringe: #2 Ordered Date: 07/11/19 Expected date of arrival: 07/12/19 Ordered by: Tonna [facility]: [facility]

## 2019-07-11 NOTE — Assessment & Plan Note (Signed)
T3 N0 Pathology reviewed Will follow up with Dr Erlene Quan and decide if anything other than surveillance is needed

## 2019-07-11 NOTE — Progress Notes (Signed)
Subjective:    Patient ID: [name], male    DOB: 11-07-1934, 85 y.o.   MRN: [id number]  HPI Here with daughter [name] for hospital follow up  Reviewed hospital records, discharge summary and pathology report  Had nephrectomy and generally did okay Bowels initially slow--but then picked up and improved Anemia worsened--- eliquis held briefly (then restarted upon discharge)  During hospital stay, BP ran low This limited his therapy Metoprolol decreased to 12.5 bid from 25mg  Staying on this for the a fib No chest pain No SOB No palpitations  Did see Dr Holley Raring in follow up Renal function was stable despite the nephrectomy Hgb up to 9.5 last week there  Has had a hard time getting back to walking Still going with the home PT Now able to get off commode and do his personal care in bathroom Not able to shower yet though  Current Outpatient Medications on File Prior to Visit  Medication Sig Dispense Refill  . acetaminophen (TYLENOL) 500 MG tablet Take 500 mg by mouth every 8 (eight) hours as needed for moderate pain or headache.     . albuterol (PROVENTIL HFA;VENTOLIN HFA) 108 (90 Base) MCG/ACT inhaler Inhale 2 puffs into the lungs every 6 (six) hours as needed for wheezing or shortness of breath. 1 Inhaler 3  . ALPRAZolam (XANAX) 0.5 MG tablet TAKE 1 TABLET BY MOUTH THREE TIMES DAILY AS NEEDED FOR ANXIETY OR SLEEP 90 tablet 0  . apixaban (ELIQUIS) 5 MG TABS tablet Take 1 tablet (5 mg total) by mouth 2 (two) times daily. 60 tablet 6  . Cholecalciferol (VITAMIN D3) 5000 UNITS CAPS Take 5,000 Units by mouth daily.     . Coenzyme Q10 (CO Q 10) 100 MG CAPS Take 100 mg by mouth daily.     . fluticasone (FLONASE) 50 MCG/ACT nasal spray Place 2 sprays into both nostrils 2 (two) times daily. 48 g 3  . folic acid (FOLVITE) 423 MCG tablet Take 1,200 mcg by mouth daily.    . furosemide (LASIX) 20 MG tablet Take 1 tablet (20 mg total) by mouth daily.    Marland Kitchen gabapentin (NEURONTIN) 300 MG  capsule TAKE 1 CAPSULE BY MOUTH THREE TIMES DAILY (Patient taking differently: Take 300 mg by mouth 3 (three) times daily. ) 90 capsule 11  . hydroxychloroquine (PLAQUENIL) 200 MG tablet Take 200 mg by mouth 2 (two) times daily.     . methotrexate (RHEUMATREX) 2.5 MG tablet Take 15 mg by mouth once a week. 6 tablets=15mg  on Thursday    . metoprolol tartrate (LOPRESSOR) 25 MG tablet Take 0.5 tablets (12.5 mg total) by mouth 2 (two) times daily. 180 tablet 3  . Multiple Vitamin (MULTIVITAMIN) tablet Take 1 tablet by mouth daily.      Marland Kitchen MYRBETRIQ 50 MG TB24 tablet TAKE 1 TABLET(50 MG) BY MOUTH DAILY (Patient taking differently: Take 50 mg by mouth daily. ) 30 tablet 3  . polyethylene glycol powder (GLYCOLAX/MIRALAX) 17 GM/SCOOP powder Take 17 g by mouth at bedtime.     . pravastatin (PRAVACHOL) 20 MG tablet TAKE 1 TABLET BY MOUTH EVERY DAY (Patient taking differently: Take 20 mg by mouth at bedtime. ) 90 tablet 3  . Probiotic Product (ALIGN) 4 MG CAPS Take 1 capsule (4 mg total) by mouth daily. 15 capsule 0  . Selenium 200 MCG CAPS Take 200 mcg by mouth daily.     . sodium chloride 1 g tablet Take 1 tablet (1 g total) by mouth 2 (  two) times daily with a meal. (Patient taking differently: Take 1 g by mouth 3 (three) times daily with meals. ) 60 tablet 1  . Turmeric 500 MG CAPS Take 500 mg by mouth daily.      Current Facility-Administered Medications on File Prior to Visit  Medication Dose Route Frequency Provider Last Rate Last Dose  . omalizumab Arvid Right) prefilled syringe 300 mg  300 mg Subcutaneous Once Chesley Mires, MD      . omalizumab Arvid Right) prefilled syringe 75 mg  75 mg Subcutaneous Once Chesley Mires, MD        Allergies  Allergen Reactions  . Ciprofloxacin Nausea And Vomiting    Headache  . Citalopram Hydrobromide Other (See Comments)    "unknown"  . Clindamycin Nausea And Vomiting  . Lorazepam Other (See Comments)    Adverse reaction  . Paroxetine Nausea Only  . Ramipril Other  (See Comments)    "unknown"  . Simvastatin   . Sulfa Antibiotics Other (See Comments)    Past Medical History:  Diagnosis Date  . (HFpEF) heart failure with preserved ejection fraction ([place])    a. 02/2019 Echo: Ef 60-65%, mildly reduced RV fxn. RVSP 21mmHg. Mildly dil LA. Mod dil RA. Mild to mod TR. Mild to mod AS. Triv AI.  Marland Kitchen Allergy   . Anemia   . Anxiety   . Arthritis    rheumatoid  . Asthma   . Basal cell carcinoma    back  . CAD (coronary artery disease)   . CHF (congestive heart failure) ([place])   . Chronic kidney disease    Mass right kidney  . Chronic sinusitis   . Collagen vascular disease (H[facility])    Rheumatoid Arthritis  . Depression   . Diverticulitis   . Dysrhythmia   . ED (erectile dysfunction)   . GERD (gastroesophageal reflux disease)   . History of SIADH   . Hx of adenomatous colonic polyps   . Hypertension   . Hyponatremia   . IBS (irritable bowel syndrome)   . Neurodermatitis   . Neurogenic bladder   . OSA (obstructive sleep apnea)    no CPAP since weight loss  . Persistent atrial fibrillation    a. Dx 02/2019; b. CHA2DS2VASc = 6-->eliquis initiated.  . Prostate cancer ([place])   . Renal mass    10/2018 4.2cm R renal cortical mass. Most compatible w/ clear cell renal cell carcinoma.    Past Surgical History:  Procedure Laterality Date  . CARDIOVERSION N/A 04/23/2019   Procedure: CARDIOVERSION;  Surgeon: Nelva Bush, MD;  Location: [facility] ORS;  Service: Cardiovascular;  Laterality: N/A;  . KNEE ARTHROPLASTY Right 09/02/2015   Procedure: COMPUTER ASSISTED TOTAL KNEE ARTHROPLASTY;  Surgeon: [name], MD;  Location: [facility] ORS;  Service: Orthopedics;  Laterality: Right;  . LAPAROSCOPIC NEPHRECTOMY, HAND ASSISTED Right 06/17/2019   Procedure: HAND ASSISTED LAPAROSCOPIC NEPHRECTOMY;  Surgeon: Hollice Espy, MD;  Location: [facility] ORS;  Service: Urology;  Laterality: Right;  . NASAL SINUS SURGERY  2009   DEVIATED SEPTUM AND POLYPS  . permanent indwelling  catheter    . PROSTATE SURGERY     PROSTATECTOMY  . TOTAL KNEE ARTHROPLASTY Left 9/15   Dr [name]  . URETHRAL STRICTURE DILATATION  02-2010   Dr.Cope    Family History  Problem Relation Age of Onset  . Heart disease Mother 25       heart failure  . Pneumonia Father 38       pnemonia  . Skin cancer  Father   . Skin cancer Sister   . Skin cancer Son   . Colon cancer Neg Hx   . Esophageal cancer Neg Hx   . Stomach cancer Neg Hx   . Pancreatic cancer Neg Hx   . Liver disease Neg Hx     Social History   Socioeconomic History  . Marital status: Married    Spouse name: Not on file  . Number of children: 2  . Years of education: Not on file  . Highest education level: Not on file  Occupational History  . 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 Financial resource strain: Not on file  . Food insecurity    Worry: Not on file    Inability: Not on file  . Transportation needs    Medical: Not on file    Non-medical: Not on file  Tobacco Use  . Smoking status: Never Smoker  . Smokeless tobacco: Never Used  Substance and Sexual Activity  . Alcohol use: Yes    Comment: 5 days out of 7 drinks gin   . Drug use: No  . Sexual activity: Not Currently  Lifestyle  . Physical activity    Days per week: Not on file    Minutes per session: Not on file  . Stress: Not on file  Relationships  . Social Herbalist on phone: Not on file    Gets together: Not on file    Attends religious service: Not on file    Active member of club or organization: Not on file    Attends meetings of clubs or organizations: Not on file    Relationship status: Not on file  . Intimate partner violence    Fear of current or ex partner: Not on file    Emotionally abused: Not on file    Physically abused: Not on file    Forced sexual activity: Not on file  Other Topics Concern  . Not on file  Social History Narrative   Not sure about a living will or health care  POA   Wife should make health care decisions for him--then daughter, then son   Would accept resuscitation attempts   Not sure about tube feeds   Review of Systems Still feels weak Appetite is good Sleep is fairly good     Objective:   Physical Exam  Constitutional: He appears well-developed. No distress.  Neck: No thyromegaly present.  Cardiovascular: Normal rate, regular rhythm and normal heart sounds. Exam reveals no gallop.  No murmur heard. Respiratory: Effort normal and breath sounds normal. No respiratory distress. He has no wheezes. He has no rales.  GI: Soft. There is no abdominal tenderness.  Musculoskeletal:        General: No edema.  Lymphadenopathy:    He has no cervical adenopathy.  Skin:  Nephrectomy site is clean and dry. Small amount of non tender induration underneath  Psychiatric: He has a normal mood and affect. His behavior is normal.           Assessment & Plan:

## 2019-07-11 NOTE — Assessment & Plan Note (Signed)
Regular On the metoprolol at lower dose Back on the eliquis

## 2019-07-11 NOTE — Assessment & Plan Note (Signed)
Is on therapy Chronic issues with mobility are adding to his post op issues Slow recovery but progressing Will likely need to extend his home therapy

## 2019-07-12 ENCOUNTER — Telehealth: Payer: Self-pay | Admitting: Pulmonary Disease

## 2019-07-12 ENCOUNTER — Ambulatory Visit: Payer: Medicare PPO

## 2019-07-12 MED ORDER — EPINEPHRINE 0.3 MG/0.3ML IJ SOAJ: 0.3000 mg | Freq: Once | INTRAMUSCULAR | 0 refills | Status: AC

## 2019-07-12 NOTE — Telephone Encounter (Signed)
Xolair Prefilled Syringe Received:  150mg  Prefilled Syringe >> quantity 4 lot # [id number], exp date 04/28/2020 75mg  Prefilled Syringe >> quantity 2, lot # [id number], exp date 02/27/2020 Medication arrival date: 07/12/19 Received by: Tonna Corner

## 2019-07-12 NOTE — Telephone Encounter (Signed)
Call returned to patient, wife available (DPR), requesting refill of epi-pen for Xolair injections. Confirmed pharmacy. Refill sent. Nothing further needed at this time.

## 2019-07-12 NOTE — Telephone Encounter (Signed)
Patient came in for Xolair injection today.  Patient daughter accompanied him to visit.  Patient stated he has a Epipen and forgot it at home.  Per injection policy, Patient could not receive Xolair injection, because he did not bring his Epipen.   Patient was instructed to reschedule.

## 2019-07-15 DIAGNOSIS — I4819 Other persistent atrial fibrillation: Secondary | ICD-10-CM | POA: Diagnosis not present

## 2019-07-15 DIAGNOSIS — I951 Orthostatic hypotension: Secondary | ICD-10-CM | POA: Diagnosis not present

## 2019-07-15 DIAGNOSIS — I10 Essential (primary) hypertension: Secondary | ICD-10-CM | POA: Diagnosis not present

## 2019-07-15 DIAGNOSIS — J45909 Unspecified asthma, uncomplicated: Secondary | ICD-10-CM | POA: Diagnosis not present

## 2019-07-15 DIAGNOSIS — I872 Venous insufficiency (chronic) (peripheral): Secondary | ICD-10-CM | POA: Diagnosis not present

## 2019-07-15 DIAGNOSIS — I6529 Occlusion and stenosis of unspecified carotid artery: Secondary | ICD-10-CM | POA: Diagnosis not present

## 2019-07-15 DIAGNOSIS — K5909 Other constipation: Secondary | ICD-10-CM | POA: Diagnosis not present

## 2019-07-15 DIAGNOSIS — Z48816 Encounter for surgical aftercare following surgery on the genitourinary system: Secondary | ICD-10-CM | POA: Diagnosis not present

## 2019-07-15 DIAGNOSIS — I2510 Atherosclerotic heart disease of native coronary artery without angina pectoris: Secondary | ICD-10-CM | POA: Diagnosis not present

## 2019-07-16 ENCOUNTER — Telehealth: Payer: Self-pay | Admitting: Pulmonary Disease

## 2019-07-16 NOTE — Telephone Encounter (Signed)
Called and spoke with Patient.  Patient requested to schedule Xolair injection.  Xolair injection scheduled for 07/19/19, at 3pm.  Patient aware to bring Epipen to injection. Nothing further needed at this time.

## 2019-07-17 ENCOUNTER — Encounter: Payer: Self-pay | Admitting: Urology

## 2019-07-17 ENCOUNTER — Ambulatory Visit: Payer: Medicare PPO | Admitting: Urology

## 2019-07-17 ENCOUNTER — Other Ambulatory Visit: Payer: Self-pay

## 2019-07-17 VITALS — BP 117/68 | HR 73 | Ht 69.0 in | Wt 177.0 lb

## 2019-07-17 DIAGNOSIS — N319 Neuromuscular dysfunction of bladder, unspecified: Secondary | ICD-10-CM

## 2019-07-17 DIAGNOSIS — C641 Malignant neoplasm of right kidney, except renal pelvis: Secondary | ICD-10-CM

## 2019-07-17 DIAGNOSIS — Z8546 Personal history of malignant neoplasm of prostate: Secondary | ICD-10-CM

## 2019-07-17 NOTE — Progress Notes (Signed)
07/17/2019 4:53 PM   [name] [birth year] [id number]  Referring provider: [name], MD [address] [place],  [place] [address]  Chief Complaint  Patient presents with  . Routine Post Op    HPI: 85 year old male who returns for routine postop evaluation following right nephrectomy.  He underwent right hand-assisted laparoscopic nephrectomy on 06/17/2023 proximately 3 cm right lower pole renal lesion with concern for invasion of the renal sinus.  His postoperative course was complicated by an ileus resolved spontaneously and delayed mobility.  He is ultimately discharged approximately 1 week postop to home with physical therapy.  Surgical pathology consistent with pT3aNx , Furhman grade 2 with invasion into the renal sinus.  Labs drawn by nephrology on 07/04/2019 showed creatinine of 0.86, hemoglobin 9.5.  Post op, he is doing fairly well.  He continues to work with PT in regain mobility and he is near baseline.  No incision issues.  REgular PMs.    He does have a personal history prostate cancer status post radical prostatectomy 1993 by Dr. [name] and salvage radiation in 1998.  He developed a small capacity bladder and is been managed with indwelling Foley catheter.  Most recent PSA 0.00 2018.    PMH: Past Medical History:  Diagnosis Date  . (HFpEF) heart failure with preserved ejection fraction ([place])    a. 02/2019 Echo: Ef 60-65%, mildly reduced RV fxn. RVSP 42mmHg. Mildly dil LA. Mod dil RA. Mild to mod TR. Mild to mod AS. Triv AI.  Marland Kitchen Allergy   . Anemia   . Anxiety   . Arthritis    rheumatoid  . Asthma   . Basal cell carcinoma    back  . CAD (coronary artery disease)   . CHF (congestive heart failure) ([place])   . Chronic kidney disease    Mass right kidney  . Chronic sinusitis   . Collagen vascular disease (HCC)    Rheumatoid Arthritis  . Depression   . Diverticulitis   . Dysrhythmia   . ED (erectile dysfunction)   . GERD (gastroesophageal reflux  disease)   . History of SIADH   . Hx of adenomatous colonic polyps   . Hypertension   . Hyponatremia   . IBS (irritable bowel syndrome)   . Neurodermatitis   . Neurogenic bladder   . OSA (obstructive sleep apnea)    no CPAP since weight loss  . Persistent atrial fibrillation    a. Dx 02/2019; b. CHA2DS2VASc = 6-->eliquis initiated.  . Prostate cancer ([place])   . Renal mass    10/2018 4.2cm R renal cortical mass. Most compatible w/ clear cell renal cell carcinoma.    Surgical History: Past Surgical History:  Procedure Laterality Date  . CARDIOVERSION N/A 04/23/2019   Procedure: CARDIOVERSION;  Surgeon: Nelva Bush, MD;  Location: [facility] ORS;  Service: Cardiovascular;  Laterality: N/A;  . KNEE ARTHROPLASTY Right 09/02/2015   Procedure: COMPUTER ASSISTED TOTAL KNEE ARTHROPLASTY;  Surgeon: [name], MD;  Location: [facility] ORS;  Service: Orthopedics;  Laterality: Right;  . LAPAROSCOPIC NEPHRECTOMY, HAND ASSISTED Right 06/17/2019   Procedure: HAND ASSISTED LAPAROSCOPIC NEPHRECTOMY;  Surgeon: Hollice Espy, MD;  Location: [facility] ORS;  Service: Urology;  Laterality: Right;  . NASAL SINUS SURGERY  2009   DEVIATED SEPTUM AND POLYPS  . permanent indwelling catheter    . PROSTATE SURGERY     PROSTATECTOMY  . TOTAL KNEE ARTHROPLASTY Left 9/15   Dr [name]  . URETHRAL STRICTURE DILATATION  02-2010  Dr.Cope    Home Medications:  Allergies as of 07/17/2019      Reactions   Ciprofloxacin Nausea And Vomiting   Headache   Citalopram Hydrobromide Other (See Comments)   "unknown"   Clindamycin Nausea And Vomiting   Lorazepam Other (See Comments)   Adverse reaction   Paroxetine Nausea Only   Ramipril Other (See Comments)   "unknown"   Simvastatin    Sulfa Antibiotics Other (See Comments)      Medication List       Accurate as of July 17, 2019  4:53 PM. If you have any questions, ask your nurse or doctor.        acetaminophen 500 MG tablet Commonly known as: TYLENOL Take 500  mg by mouth every 8 (eight) hours as needed for moderate pain or headache.   albuterol 108 (90 Base) MCG/ACT inhaler Commonly known as: VENTOLIN HFA Inhale 2 puffs into the lungs every 6 (six) hours as needed for wheezing or shortness of breath.   Align 4 MG Caps Take 1 capsule (4 mg total) by mouth daily.   ALPRAZolam 0.5 MG tablet Commonly known as: XANAX TAKE 1 TABLET BY MOUTH THREE TIMES DAILY AS NEEDED FOR ANXIETY OR SLEEP   apixaban 5 MG Tabs tablet Commonly known as: Eliquis Take 1 tablet (5 mg total) by mouth 2 (two) times daily.   Co Q 10 100 MG Caps Take 100 mg by mouth daily.   fluticasone 50 MCG/ACT nasal spray Commonly known as: FLONASE Place 2 sprays into both nostrils 2 (two) times daily.   folic acid 867 MCG tablet Commonly known as: FOLVITE Take 1,200 mcg by mouth daily.   furosemide 20 MG tablet Commonly known as: LASIX Take 1 tablet (20 mg total) by mouth daily.   gabapentin 300 MG capsule Commonly known as: NEURONTIN TAKE 1 CAPSULE BY MOUTH THREE TIMES DAILY   hydroxychloroquine 200 MG tablet Commonly known as: PLAQUENIL Take 200 mg by mouth 2 (two) times daily.   methotrexate 2.5 MG tablet Commonly known as: RHEUMATREX Take 15 mg by mouth once a week. 6 tablets=15mg  on Thursday   metoprolol tartrate 25 MG tablet Commonly known as: LOPRESSOR Take 0.5 tablets (12.5 mg total) by mouth 2 (two) times daily.   multivitamin tablet Take 1 tablet by mouth daily.   Myrbetriq 50 MG Tb24 tablet Generic drug: mirabegron ER TAKE 1 TABLET(50 MG) BY MOUTH DAILY What changed: See the new instructions.   polyethylene glycol powder 17 GM/SCOOP powder Commonly known as: GLYCOLAX/MIRALAX Take 17 g by mouth at bedtime.   pravastatin 20 MG tablet Commonly known as: PRAVACHOL TAKE 1 TABLET BY MOUTH EVERY DAY What changed: when to take this   Selenium 200 MCG Caps Take 200 mcg by mouth daily.   sodium chloride 1 g tablet Take 1 tablet (1 g total) by  mouth 2 (two) times daily with a meal. What changed: when to take this   Turmeric 500 MG Caps Take 500 mg by mouth daily.   Vitamin D3 125 MCG (5000 UT) Caps Take 5,000 Units by mouth daily.       Allergies:  Allergies  Allergen Reactions  . Ciprofloxacin Nausea And Vomiting    Headache  . Citalopram Hydrobromide Other (See Comments)    "unknown"  . Clindamycin Nausea And Vomiting  . Lorazepam Other (See Comments)    Adverse reaction  . Paroxetine Nausea Only  . Ramipril Other (See Comments)    "unknown"  . Simvastatin   .  Sulfa Antibiotics Other (See Comments)    Family History: Family History  Problem Relation Age of Onset  . Heart disease Mother 7       heart failure  . Pneumonia Father 21       pnemonia  . Skin cancer Father   . Skin cancer Sister   . Skin cancer Son   . Colon cancer Neg Hx   . Esophageal cancer Neg Hx   . Stomach cancer Neg Hx   . Pancreatic cancer Neg Hx   . Liver disease Neg Hx     Social History:  reports that he has never smoked. He has never used smokeless tobacco. He reports current alcohol use. He reports that he does not use drugs.  ROS: UROLOGY Frequent Urination?: No Hard to postpone urination?: No Burning/pain with urination?: No Get up at night to urinate?: No Leakage of urine?: No Urine stream starts and stops?: No Trouble starting stream?: No Do you have to strain to urinate?: No Blood in urine?: No Urinary tract infection?: No Sexually transmitted disease?: No Injury to kidneys or bladder?: No Painful intercourse?: No Weak stream?: No Erection problems?: No Penile pain?: No  Gastrointestinal Nausea?: No Vomiting?: No Indigestion/heartburn?: No Diarrhea?: No Constipation?: No  Constitutional Fever: No Night sweats?: No Weight loss?: Yes Fatigue?: Yes  Skin Skin rash/lesions?: No Itching?: No  Eyes Blurred vision?: No Double vision?: No  Ears/Nose/Throat Sore throat?: No Sinus problems?: No   Hematologic/Lymphatic Swollen glands?: No Easy bruising?: No  Cardiovascular Leg swelling?: No Chest pain?: No  Respiratory Cough?: No Shortness of breath?: No  Endocrine Excessive thirst?: No  Musculoskeletal Back pain?: Yes Joint pain?: Yes  Neurological Headaches?: No Dizziness?: No  Psychologic Depression?: No Anxiety?: Yes  Physical Exam: BP 117/68   Pulse 73   Ht 5\' 9"  (1.753 m)   Wt 177 lb (80.3 kg)   BMI 26.14 kg/m   Constitutional:  Alert and oriented, No acute distress.  In wheelchair, accompanied by daughter today.   HEENT: [place] AT, moist mucus membranes.  Trachea midline, no masses. Cardiovascular: No clubbing, cyanosis, or edema. Respiratory: Normal respiratory effort, no increased work of breathing. GI: Abdomen is soft, nontender, nondistended, incision healing well GU: foley draining clear urine Skin: No rashes, bruises or suspicious lesions. Neurologic: Grossly intact, no focal deficits, moving all 4 extremities. Psychiatric: Normal mood and affect.  Laboratory Data: Lab Results  Component Value Date   WBC 4.7 06/22/2019   HGB 8.5 (L) 06/22/2019   HCT 25.5 (L) 06/22/2019   MCV 96.7 06/22/2019   PLT 188 06/22/2019    Lab Results  Component Value Date   CREATININE 0.87 06/22/2019    Lab Results  Component Value Date   PSA 0.00 (L) 04/27/2017   PSA 0.01 (L) 05/18/2016   PSA 0.01 (L) 01/26/2015    Lab Results  Component Value Date   HGBA1C 5.1 05/26/2018    Assessment & Plan:    1. Renal cell cancer, right ([place]) pT3a RCC s/p lap nephrectomy Margins negative Discussed NCCN guidelines for surveillance F/u 6 months with CT scan/ CXR Discussed option for adjuvant chemo, risks/ benefits, declined - CT Abdomen Pelvis W Contrast; Future - Chest 1 View; Future  2. History of prostate cancer Due for PSA, will check next visit - PSA; Future  3. Neurogenic bladder Chronic indwelling foley exchanged today  Return in about 6  months (around 01/17/2020) for CT scan/ CXR.  Hollice Espy, MD  [facility]  [address] [place], [place] [address] [phone]

## 2019-07-17 NOTE — Progress Notes (Signed)
Cath Change/ Replacement  Patient is present today for a catheter change due to urinary retention. 36ml of water was removed from the balloon, a 16FR foley cath was removed with out difficulty.  Patient was cleaned and prepped in a sterile fashion with betadine and 2% lidocaine jelly was instilled into the urethra. A 16FR foley cath was replaced into the bladder no complications were noted Urine return was noted 25ml and urine was yellow in color. The balloon was filled with 79ml of sterile water. Catheter was attached for drainage to his leg bag as requested. Patient was given proper instruction on catheter care.    Performed by: Blondell Reveal  Follow up: One month

## 2019-07-18 DIAGNOSIS — K5909 Other constipation: Secondary | ICD-10-CM | POA: Diagnosis not present

## 2019-07-18 DIAGNOSIS — I10 Essential (primary) hypertension: Secondary | ICD-10-CM | POA: Diagnosis not present

## 2019-07-18 DIAGNOSIS — Z48816 Encounter for surgical aftercare following surgery on the genitourinary system: Secondary | ICD-10-CM | POA: Diagnosis not present

## 2019-07-18 DIAGNOSIS — I872 Venous insufficiency (chronic) (peripheral): Secondary | ICD-10-CM | POA: Diagnosis not present

## 2019-07-18 DIAGNOSIS — I6529 Occlusion and stenosis of unspecified carotid artery: Secondary | ICD-10-CM | POA: Diagnosis not present

## 2019-07-18 DIAGNOSIS — I4819 Other persistent atrial fibrillation: Secondary | ICD-10-CM | POA: Diagnosis not present

## 2019-07-18 DIAGNOSIS — I2510 Atherosclerotic heart disease of native coronary artery without angina pectoris: Secondary | ICD-10-CM | POA: Diagnosis not present

## 2019-07-18 DIAGNOSIS — J45909 Unspecified asthma, uncomplicated: Secondary | ICD-10-CM | POA: Diagnosis not present

## 2019-07-18 DIAGNOSIS — I951 Orthostatic hypotension: Secondary | ICD-10-CM | POA: Diagnosis not present

## 2019-07-19 ENCOUNTER — Ambulatory Visit (INDEPENDENT_AMBULATORY_CARE_PROVIDER_SITE_OTHER): Payer: Medicare PPO

## 2019-07-19 ENCOUNTER — Other Ambulatory Visit: Payer: Self-pay

## 2019-07-19 DIAGNOSIS — J4550 Severe persistent asthma, uncomplicated: Secondary | ICD-10-CM

## 2019-07-19 MED ORDER — OMALIZUMAB 75 MG/0.5ML ~~LOC~~ SOSY
75.0000 mg | PREFILLED_SYRINGE | Freq: Once | SUBCUTANEOUS | Status: AC
  Administered 2019-07-19: 15:00:00 75 mg via SUBCUTANEOUS

## 2019-07-19 MED ORDER — OMALIZUMAB 150 MG/ML ~~LOC~~ SOSY
300.0000 mg | PREFILLED_SYRINGE | Freq: Once | SUBCUTANEOUS | Status: AC
  Administered 2019-07-19: 15:00:00 300 mg via SUBCUTANEOUS

## 2019-07-19 NOTE — Progress Notes (Signed)
All questions were answered by the patient before medication was administered. Have you been hospitalized in the last 10 days? No Do you have a fever? No Do you have a cough? No Do you have a headache or sore throat? No  

## 2019-07-24 DIAGNOSIS — K5909 Other constipation: Secondary | ICD-10-CM | POA: Diagnosis not present

## 2019-07-24 DIAGNOSIS — I6529 Occlusion and stenosis of unspecified carotid artery: Secondary | ICD-10-CM | POA: Diagnosis not present

## 2019-07-24 DIAGNOSIS — I4819 Other persistent atrial fibrillation: Secondary | ICD-10-CM | POA: Diagnosis not present

## 2019-07-24 DIAGNOSIS — I872 Venous insufficiency (chronic) (peripheral): Secondary | ICD-10-CM | POA: Diagnosis not present

## 2019-07-24 DIAGNOSIS — I951 Orthostatic hypotension: Secondary | ICD-10-CM | POA: Diagnosis not present

## 2019-07-24 DIAGNOSIS — I10 Essential (primary) hypertension: Secondary | ICD-10-CM | POA: Diagnosis not present

## 2019-07-24 DIAGNOSIS — J45909 Unspecified asthma, uncomplicated: Secondary | ICD-10-CM | POA: Diagnosis not present

## 2019-07-24 DIAGNOSIS — Z48816 Encounter for surgical aftercare following surgery on the genitourinary system: Secondary | ICD-10-CM | POA: Diagnosis not present

## 2019-07-24 DIAGNOSIS — I2510 Atherosclerotic heart disease of native coronary artery without angina pectoris: Secondary | ICD-10-CM | POA: Diagnosis not present

## 2019-07-25 DIAGNOSIS — C641 Malignant neoplasm of right kidney, except renal pelvis: Secondary | ICD-10-CM | POA: Diagnosis not present

## 2019-07-25 DIAGNOSIS — G8929 Other chronic pain: Secondary | ICD-10-CM | POA: Diagnosis not present

## 2019-07-25 DIAGNOSIS — M25512 Pain in left shoulder: Secondary | ICD-10-CM | POA: Diagnosis not present

## 2019-07-25 DIAGNOSIS — M0579 Rheumatoid arthritis with rheumatoid factor of multiple sites without organ or systems involvement: Secondary | ICD-10-CM | POA: Diagnosis not present

## 2019-07-26 DIAGNOSIS — Z48816 Encounter for surgical aftercare following surgery on the genitourinary system: Secondary | ICD-10-CM | POA: Diagnosis not present

## 2019-07-26 DIAGNOSIS — I4819 Other persistent atrial fibrillation: Secondary | ICD-10-CM | POA: Diagnosis not present

## 2019-07-26 DIAGNOSIS — I10 Essential (primary) hypertension: Secondary | ICD-10-CM | POA: Diagnosis not present

## 2019-07-26 DIAGNOSIS — I2510 Atherosclerotic heart disease of native coronary artery without angina pectoris: Secondary | ICD-10-CM | POA: Diagnosis not present

## 2019-07-26 DIAGNOSIS — I6529 Occlusion and stenosis of unspecified carotid artery: Secondary | ICD-10-CM | POA: Diagnosis not present

## 2019-07-26 DIAGNOSIS — K5909 Other constipation: Secondary | ICD-10-CM | POA: Diagnosis not present

## 2019-07-26 DIAGNOSIS — I951 Orthostatic hypotension: Secondary | ICD-10-CM | POA: Diagnosis not present

## 2019-07-26 DIAGNOSIS — J45909 Unspecified asthma, uncomplicated: Secondary | ICD-10-CM | POA: Diagnosis not present

## 2019-07-26 DIAGNOSIS — I872 Venous insufficiency (chronic) (peripheral): Secondary | ICD-10-CM | POA: Diagnosis not present

## 2019-07-29 DIAGNOSIS — I4819 Other persistent atrial fibrillation: Secondary | ICD-10-CM | POA: Diagnosis not present

## 2019-07-29 DIAGNOSIS — I2510 Atherosclerotic heart disease of native coronary artery without angina pectoris: Secondary | ICD-10-CM | POA: Diagnosis not present

## 2019-07-29 DIAGNOSIS — K5909 Other constipation: Secondary | ICD-10-CM | POA: Diagnosis not present

## 2019-07-29 DIAGNOSIS — I951 Orthostatic hypotension: Secondary | ICD-10-CM | POA: Diagnosis not present

## 2019-07-29 DIAGNOSIS — J45909 Unspecified asthma, uncomplicated: Secondary | ICD-10-CM | POA: Diagnosis not present

## 2019-07-29 DIAGNOSIS — I10 Essential (primary) hypertension: Secondary | ICD-10-CM | POA: Diagnosis not present

## 2019-07-29 DIAGNOSIS — I6529 Occlusion and stenosis of unspecified carotid artery: Secondary | ICD-10-CM | POA: Diagnosis not present

## 2019-07-29 DIAGNOSIS — I872 Venous insufficiency (chronic) (peripheral): Secondary | ICD-10-CM | POA: Diagnosis not present

## 2019-07-29 DIAGNOSIS — Z48816 Encounter for surgical aftercare following surgery on the genitourinary system: Secondary | ICD-10-CM | POA: Diagnosis not present

## 2019-07-31 DIAGNOSIS — K5909 Other constipation: Secondary | ICD-10-CM | POA: Diagnosis not present

## 2019-07-31 DIAGNOSIS — Z48816 Encounter for surgical aftercare following surgery on the genitourinary system: Secondary | ICD-10-CM | POA: Diagnosis not present

## 2019-07-31 DIAGNOSIS — I6529 Occlusion and stenosis of unspecified carotid artery: Secondary | ICD-10-CM | POA: Diagnosis not present

## 2019-07-31 DIAGNOSIS — J45909 Unspecified asthma, uncomplicated: Secondary | ICD-10-CM | POA: Diagnosis not present

## 2019-07-31 DIAGNOSIS — I872 Venous insufficiency (chronic) (peripheral): Secondary | ICD-10-CM | POA: Diagnosis not present

## 2019-07-31 DIAGNOSIS — I2510 Atherosclerotic heart disease of native coronary artery without angina pectoris: Secondary | ICD-10-CM | POA: Diagnosis not present

## 2019-07-31 DIAGNOSIS — I4819 Other persistent atrial fibrillation: Secondary | ICD-10-CM | POA: Diagnosis not present

## 2019-07-31 DIAGNOSIS — I951 Orthostatic hypotension: Secondary | ICD-10-CM | POA: Diagnosis not present

## 2019-07-31 DIAGNOSIS — I10 Essential (primary) hypertension: Secondary | ICD-10-CM | POA: Diagnosis not present

## 2019-08-01 ENCOUNTER — Other Ambulatory Visit: Payer: Self-pay | Admitting: Internal Medicine

## 2019-08-02 NOTE — Telephone Encounter (Signed)
Last filled 06-13-19 #90 Last OV 07-11-19 Next OV 11-11-19 [facility] [address] [employer] to [employer] in Dr Alla German absence.  I built the rx for pravastatin to go wit h it. Not sure why Epic would not allow me to fill it and leave the alprazolam.

## 2019-08-06 ENCOUNTER — Ambulatory Visit (INDEPENDENT_AMBULATORY_CARE_PROVIDER_SITE_OTHER): Payer: Medicare PPO

## 2019-08-06 ENCOUNTER — Other Ambulatory Visit: Payer: Self-pay

## 2019-08-06 DIAGNOSIS — J4550 Severe persistent asthma, uncomplicated: Secondary | ICD-10-CM

## 2019-08-06 MED ORDER — OMALIZUMAB 75 MG/0.5ML ~~LOC~~ SOSY
75.0000 mg | PREFILLED_SYRINGE | Freq: Once | SUBCUTANEOUS | Status: AC
  Administered 2019-08-06: 75 mg via SUBCUTANEOUS

## 2019-08-06 MED ORDER — OMALIZUMAB 150 MG/ML ~~LOC~~ SOSY
300.0000 mg | PREFILLED_SYRINGE | Freq: Once | SUBCUTANEOUS | Status: AC
  Administered 2019-08-06: 15:00:00 300 mg via SUBCUTANEOUS

## 2019-08-06 NOTE — Progress Notes (Signed)
All questions were answered by the patient before medication was administered. Have you been hospitalized in the last 10 days? No Do you have a fever? No Do you have a cough? No Do you have a headache or sore throat? No  

## 2019-08-07 DIAGNOSIS — Z48816 Encounter for surgical aftercare following surgery on the genitourinary system: Secondary | ICD-10-CM | POA: Diagnosis not present

## 2019-08-07 DIAGNOSIS — I951 Orthostatic hypotension: Secondary | ICD-10-CM | POA: Diagnosis not present

## 2019-08-07 DIAGNOSIS — I2510 Atherosclerotic heart disease of native coronary artery without angina pectoris: Secondary | ICD-10-CM | POA: Diagnosis not present

## 2019-08-07 DIAGNOSIS — I6529 Occlusion and stenosis of unspecified carotid artery: Secondary | ICD-10-CM | POA: Diagnosis not present

## 2019-08-07 DIAGNOSIS — I4819 Other persistent atrial fibrillation: Secondary | ICD-10-CM | POA: Diagnosis not present

## 2019-08-07 DIAGNOSIS — K5909 Other constipation: Secondary | ICD-10-CM | POA: Diagnosis not present

## 2019-08-07 DIAGNOSIS — I872 Venous insufficiency (chronic) (peripheral): Secondary | ICD-10-CM | POA: Diagnosis not present

## 2019-08-07 DIAGNOSIS — I10 Essential (primary) hypertension: Secondary | ICD-10-CM | POA: Diagnosis not present

## 2019-08-07 DIAGNOSIS — J45909 Unspecified asthma, uncomplicated: Secondary | ICD-10-CM | POA: Diagnosis not present

## 2019-08-09 ENCOUNTER — Other Ambulatory Visit: Payer: Self-pay

## 2019-08-09 ENCOUNTER — Ambulatory Visit: Payer: Medicare PPO | Admitting: Cardiovascular Disease

## 2019-08-09 ENCOUNTER — Encounter: Payer: Self-pay | Admitting: Cardiovascular Disease

## 2019-08-09 VITALS — BP 116/54 | HR 88 | Ht 69.0 in | Wt 172.5 lb

## 2019-08-09 DIAGNOSIS — I10 Essential (primary) hypertension: Secondary | ICD-10-CM

## 2019-08-09 DIAGNOSIS — I5032 Chronic diastolic (congestive) heart failure: Secondary | ICD-10-CM

## 2019-08-09 DIAGNOSIS — I4819 Other persistent atrial fibrillation: Secondary | ICD-10-CM

## 2019-08-09 DIAGNOSIS — I359 Nonrheumatic aortic valve disorder, unspecified: Secondary | ICD-10-CM | POA: Diagnosis not present

## 2019-08-09 NOTE — Progress Notes (Signed)
Cardiology Office Note   Date:  08/09/2019   ID:  [name], DOB 03-12-1934, MRN [id number]  PCP:  [name], MD  Cardiologist:   Kathlyn Sacramento, MD   Chief Complaint  Patient presents with  . Other    3 month follow up. Patient denies chest pain and SOB at this time. Meds reviewed verbally with patient.       History of Present Illness: [name] is a 85 y.o. male who presents for a follow-up visit regarding atrial fibrillation .The patient has chronic medical conditions that include rheumatoid arthritis, history of TIA, hyperlipidemia, chronic hyponatremia due to suspected SIADH and osteoarthritis. He had an echocardiogram done in June 2019 which showed normal LV systolic function with very mild aortic stenosis and mild mitral regurgitation. He has been followed by urology for right renal mass that was diagnosed last year but has increased in size.  He was supposed to get nephrectomy done in March but it was postponed due to COVID-19. The patient started having worsening leg edema and weight gain in April.  He was referred for an echocardiogram which showed normal LV systolic function with mild pulmonary hypertension.  He was noted to be in atrial fibrillation.  He was switched from Plavix to Eliquis 5 mg twice daily and was also started on metoprolol 25 mg twice daily.   He was thought to have diastolic heart failure related to atrial fibrillation.  The dose of furosemide was increased to 40 mg once daily .  He responded very well to diuresis with significant drop in weight.  He underwent successful cardioversion on May 26.    He underwent a right hand-assisted laparoscopic nephrectomy in July.  His postoperative course was complicated by an ileus which resolved spontaneously.  He has been doing well overall and feels stronger.  No chest pain, shortness of breath or palpitations.  The dose of metoprolol was decreased to 12.5 mg twice daily due to low blood pressure.  No  recurrent palpitations.  Past Medical History:  Diagnosis Date  . (HFpEF) heart failure with preserved ejection fraction ([place])    a. 02/2019 Echo: Ef 60-65%, mildly reduced RV fxn. RVSP 23mmHg. Mildly dil LA. Mod dil RA. Mild to mod TR. Mild to mod AS. Triv AI.  Marland Kitchen Allergy   . Anemia   . Anxiety   . Arthritis    rheumatoid  . Asthma   . Basal cell carcinoma    back  . CAD (coronary artery disease)   . CHF (congestive heart failure) ([place])   . Chronic kidney disease    Mass right kidney  . Chronic sinusitis   . Collagen vascular disease (HCC)    Rheumatoid Arthritis  . Depression   . Diverticulitis   . Dysrhythmia   . ED (erectile dysfunction)   . GERD (gastroesophageal reflux disease)   . History of SIADH   . Hx of adenomatous colonic polyps   . Hypertension   . Hyponatremia   . IBS (irritable bowel syndrome)   . Neurodermatitis   . Neurogenic bladder   . OSA (obstructive sleep apnea)    no CPAP since weight loss  . Persistent atrial fibrillation    a. Dx 02/2019; b. CHA2DS2VASc = 6-->eliquis initiated.  . Prostate cancer ([place])   . Renal mass    10/2018 4.2cm R renal cortical mass. Most compatible w/ clear cell renal cell carcinoma.    Past Surgical History:  Procedure Laterality Date  .  CARDIOVERSION N/A 04/23/2019   Procedure: CARDIOVERSION;  Surgeon: Nelva Bush, MD;  Location: [facility] ORS;  Service: Cardiovascular;  Laterality: N/A;  . KNEE ARTHROPLASTY Right 09/02/2015   Procedure: COMPUTER ASSISTED TOTAL KNEE ARTHROPLASTY;  Surgeon: [name], MD;  Location: [facility] ORS;  Service: Orthopedics;  Laterality: Right;  . LAPAROSCOPIC NEPHRECTOMY, HAND ASSISTED Right 06/17/2019   Procedure: HAND ASSISTED LAPAROSCOPIC NEPHRECTOMY;  Surgeon: Hollice Espy, MD;  Location: [facility] ORS;  Service: Urology;  Laterality: Right;  . NASAL SINUS SURGERY  2009   DEVIATED SEPTUM AND POLYPS  . permanent indwelling catheter    . PROSTATE SURGERY     PROSTATECTOMY  . TOTAL KNEE  ARTHROPLASTY Left 9/15   Dr [name]  . URETHRAL STRICTURE DILATATION  02-2010   Dr.Cope     Current Outpatient Medications  Medication Sig Dispense Refill  . acetaminophen (TYLENOL) 500 MG tablet Take 500 mg by mouth every 8 (eight) hours as needed for moderate pain or headache.     . albuterol (PROVENTIL HFA;VENTOLIN HFA) 108 (90 Base) MCG/ACT inhaler Inhale 2 puffs into the lungs every 6 (six) hours as needed for wheezing or shortness of breath. 1 Inhaler 3  . ALPRAZolam (XANAX) 0.5 MG tablet TAKE 1 TABLET BY MOUTH THREE TIMES DAILY AS NEEDED FOR ANXIETY OR SLEEP 90 tablet 0  . apixaban (ELIQUIS) 5 MG TABS tablet Take 1 tablet (5 mg total) by mouth 2 (two) times daily. 60 tablet 6  . Cholecalciferol (VITAMIN D3) 5000 UNITS CAPS Take 5,000 Units by mouth daily.     . Coenzyme Q10 (CO Q 10) 100 MG CAPS Take 100 mg by mouth daily.     . fluticasone (FLONASE) 50 MCG/ACT nasal spray Place 2 sprays into both nostrils 2 (two) times daily. 48 g 3  . folic acid (FOLVITE) A999333 MCG tablet Take 1,200 mcg by mouth daily.    . furosemide (LASIX) 20 MG tablet Take 1 tablet (20 mg total) by mouth daily.    Marland Kitchen gabapentin (NEURONTIN) 300 MG capsule TAKE 1 CAPSULE BY MOUTH THREE TIMES DAILY (Patient taking differently: Take 300 mg by mouth 3 (three) times daily. ) 90 capsule 11  . hydroxychloroquine (PLAQUENIL) 200 MG tablet Take 200 mg by mouth 2 (two) times daily.     . methotrexate (RHEUMATREX) 2.5 MG tablet Take 15 mg by mouth once a week. 6 tablets=15mg  on Thursday    . metoprolol tartrate (LOPRESSOR) 25 MG tablet Take 0.5 tablets (12.5 mg total) by mouth 2 (two) times daily. 180 tablet 3  . Multiple Vitamin (MULTIVITAMIN) tablet Take 1 tablet by mouth daily.      Marland Kitchen MYRBETRIQ 50 MG TB24 tablet TAKE 1 TABLET(50 MG) BY MOUTH DAILY (Patient taking differently: Take 50 mg by mouth daily. ) 30 tablet 3  . polyethylene glycol powder (GLYCOLAX/MIRALAX) 17 GM/SCOOP powder Take 17 g by mouth at bedtime.     .  pravastatin (PRAVACHOL) 20 MG tablet Take 1 tablet (20 mg total) by mouth at bedtime. 90 tablet 3  . Probiotic Product (ALIGN) 4 MG CAPS Take 1 capsule (4 mg total) by mouth daily. 15 capsule 0  . Selenium 200 MCG CAPS Take 200 mcg by mouth daily.     . sodium chloride 1 g tablet Take 1 tablet (1 g total) by mouth 2 (two) times daily with a meal. (Patient taking differently: Take 1 g by mouth 3 (three) times daily with meals. ) 60 tablet 1  . Turmeric 500 MG CAPS Take  500 mg by mouth daily.      Current Facility-Administered Medications  Medication Dose Route Frequency Provider Last Rate Last Dose  . omalizumab Arvid Right) prefilled syringe 300 mg  300 mg Subcutaneous Once Chesley Mires, MD      . omalizumab Arvid Right) prefilled syringe 75 mg  75 mg Subcutaneous Once Chesley Mires, MD        Allergies:   Ciprofloxacin, Citalopram hydrobromide, Clindamycin, Lorazepam, Paroxetine, Ramipril, Simvastatin, and Sulfa antibiotics    Social History:  The patient  reports that he has never smoked. He has never used smokeless tobacco. He reports current alcohol use. He reports that he does not use drugs.   Family History:  The patient's family history includes Heart disease (age of onset: 32) in his mother; Pneumonia (age of onset: 19) in his father; Skin cancer in his father, sister, and son.    ROS:  Please see the history of present illness.   Otherwise, review of systems are positive for none.   All other systems are reviewed and negative.    PHYSICAL EXAM: VS:  BP (!) 116/54 (BP Location: Left Arm, Patient Position: Sitting, Cuff Size: Normal)   Pulse 88   Ht 5\' 9"  (1.753 m)   Wt 172 lb 8 oz (78.2 kg)   BMI 25.47 kg/m  , BMI Body mass index is 25.47 kg/m. GEN: Well nourished, well developed, in no acute distress  HEENT: normal  Neck: no JVD, carotid bruits, or masses Cardiac: Regular rate and rhythm; no  rubs, or gallops,no edema .  2/ 6 systolic murmur in the aortic area which is mid peaking  Respiratory:  clear to auscultation bilaterally, normal work of breathing GI: soft, nontender, nondistended, + BS MS: no deformity or atrophy  Skin: warm and dry, no rash Neuro:  Strength and sensation are intact Psych: euthymic mood, full affect   EKG:  EKG is ordered today. The ekg ordered today demonstrates normal sinus rhythm with right bundle branch block and left anterior fascicular block.     Recent Labs: 10/28/2018: ALT 15 04/19/2019: TSH 3.159 06/22/2019: BUN 27; Creatinine, Ser 0.87; Hemoglobin 8.5; Platelets 188; Potassium 4.1; Sodium 132    Lipid Panel    Component Value Date/Time   CHOL 114 05/26/2018 0411   TRIG 49 05/26/2018 0411   HDL 43 05/26/2018 0411   CHOLHDL 2.7 05/26/2018 0411   VLDL 10 05/26/2018 0411   LDLCALC 61 05/26/2018 0411      Wt Readings from Last 3 Encounters:  08/09/19 172 lb 8 oz (78.2 kg)  07/17/19 177 lb (80.3 kg)  07/11/19 179 lb (81.2 kg)       PAD Screen 12/28/2018  Previous PAD dx? No  Previous surgical procedure? (No Data)  Pain with walking? Yes  Subsides with rest? Yes  Feet/toe relief with dangling? No  Painful, non-healing ulcers? No  Extremities discolored? Yes      ASSESSMENT AND PLAN:  1.  Persistent atrial fibrillation: Status post recent successful cardioversion.  He is maintaining in sinus rhythm.  Continue small dose metoprolol 12.5 mg twice daily.  If he has recurrent symptomatic atrial fibrillation, he might require an antiarrhythmic medication given inability to use higher dose of metoprolol due to low blood pressure.  Continue anticoagulation with Eliquis.  He did have anemia postoperatively but hemoglobin dropped to 9.5.  He also had slight worsening of kidney function but creatinine returned back to normal.    2.  Chronic diastolic heart failure: He appears to  be euvolemic on current dose of furosemide 20 mg once daily.  3.  Aortic stenosis: This was mild to moderate on most recent echocardiogram and seems  to be moderate by physical exam.  Repeat echocardiogram in June of next year.   Disposition:   FU in 4 months.  Signed,  Kathlyn Sacramento, MD  08/09/2019 2:34 PM    [place]

## 2019-08-09 NOTE — Patient Instructions (Signed)
Medication Instructions:  Your physician recommends that you continue on your current medications as directed. Please refer to the Current Medication list given to you today.  If you need a refill on your cardiac medications before your next appointment, please call your pharmacy.   Lab work: None ordered If you have labs (blood work) drawn today and your tests are completely normal, you will receive your results only by: Marland Kitchen MyChart Message (if you have MyChart) OR . A paper copy in the mail If you have any lab test that is abnormal or we need to change your treatment, we will call you to review the results.  Testing/Procedures: None ordered  Follow-Up: At [facility] you and your health needs are our priority.  As part of our continuing mission to provide you with exceptional heart care, we have created designated Provider Care Teams.  These Care Teams include your primary Cardiologist (physician) and Advanced Practice Providers (APPs -  Physician Assistants and Nurse Practitioners) who all work together to provide you with the care you need, when you need it. You will need a follow up appointment in 4 months.  Please call our office 2 months in advance to schedule this appointment.  You may see Kathlyn Sacramento, MD or one of the following Advanced Practice Providers on your designated Care Team:   Murray Hodgkins, NP Christell Faith, PA-C . Marrianne Mood, PA-C  Any Other Special Instructions Will Be Listed Below (If Applicable). N/A

## 2019-08-12 IMAGING — MR MR MRA HEAD W/O CM
11 series · 44 of 48 positions shown · non-contrast
Comparison: None.

CLINICAL DATA: Transient ischemic attack.  Slurred speech.

EXAM:
MRI HEAD WITHOUT CONTRAST
MRA HEAD WITHOUT CONTRAST
TECHNIQUE: Multiplanar, multiecho pulse sequences of the brain and surrounding
structures were obtained without intravenous contrast. Angiographic
images of the head were obtained using MRA technique without
contrast.

[Series 2: T1 · sagittal · 5.0mm · 0.45mm/px · 2 of 29 slices shown (1 of 2)]
[im 1/29]
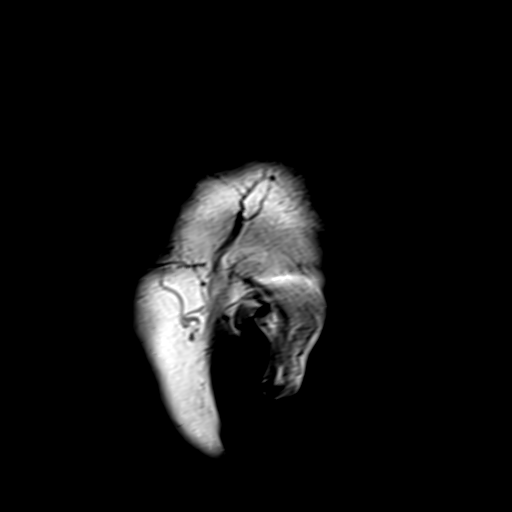
[im 29/29]
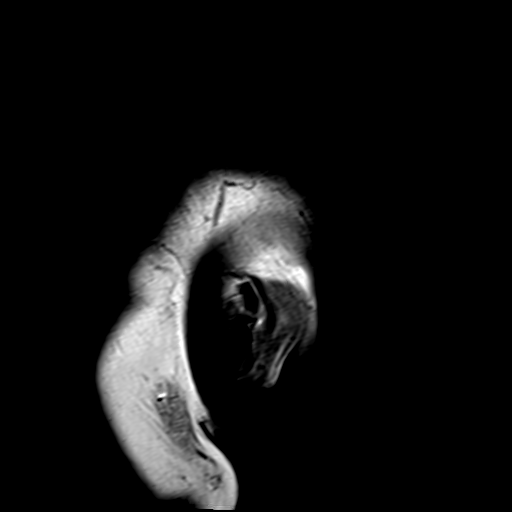

[Series 4: DWI · axial · 3.0mm · 1.80mm/px · z∈[-77,+91]mm · 5 of 57 slices shown (1 of 2)]
[im 1/57]
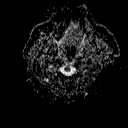
[im 15/57]
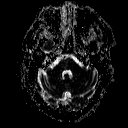
[im 29/57]
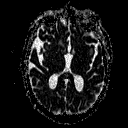
[im 43/57]
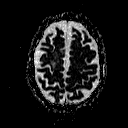
[im 57/57]
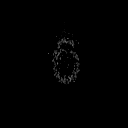

[Series 6: DWI · coronal · 3.0mm · 1.80mm/px · 4 of 51 slices shown (2 of 2)]
[im 1/51]
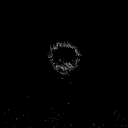
[im 17/51]
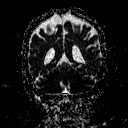
[im 34/51]
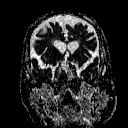
[im 51/51]
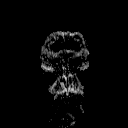

[Series 7: T2 · axial · 5.0mm · 0.90mm/px · z∈[-82,+99]mm · 2 of 29 slices shown (1 of 3)]
[im 1/29]
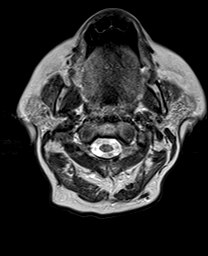
[im 29/29]
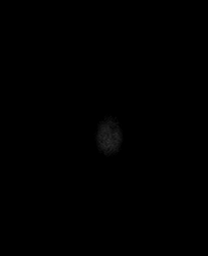

[Series 8: FLAIR · axial · 5.0mm · 0.45mm/px · z∈[-82,+99]mm · 2 of 29 slices shown]
[im 1/29]
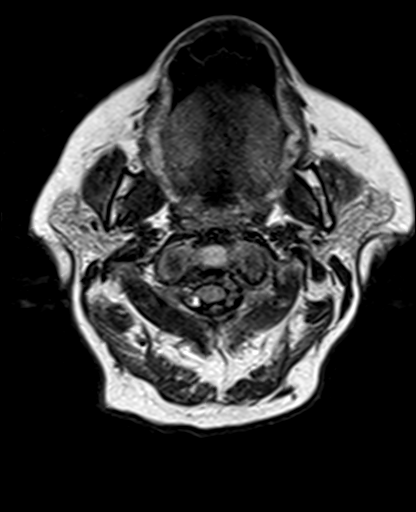
[im 29/29]
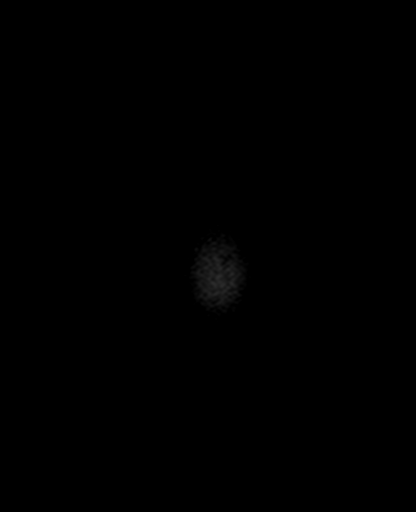

[Series 9: tof_3d_multi-slab · axial · 0.7mm · 0.37mm/px · z∈[-64,+54]mm · 10 of 169 slices shown]
[im 1/169]
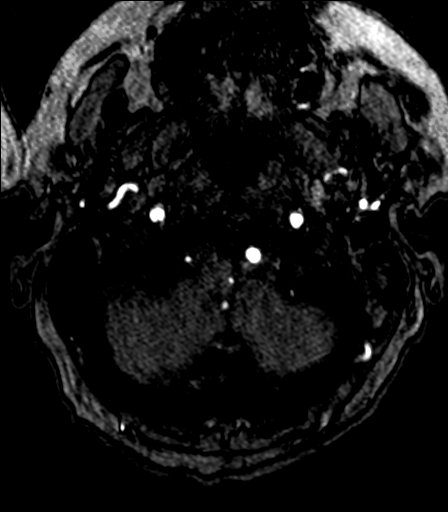
[im 13/169]
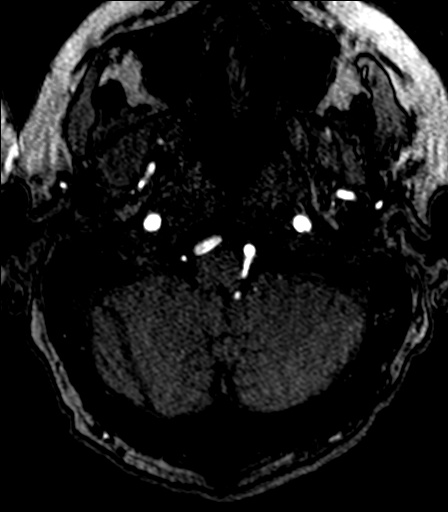
[im 26/169]
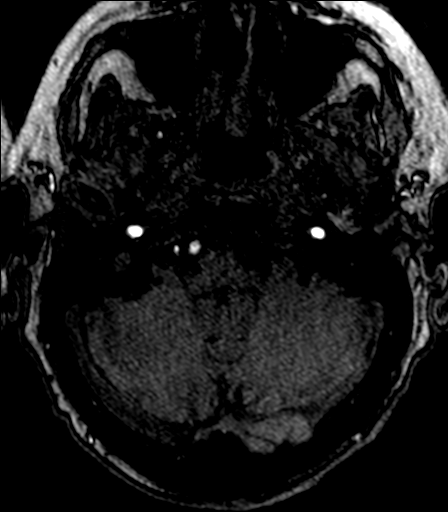
[im 39/169]
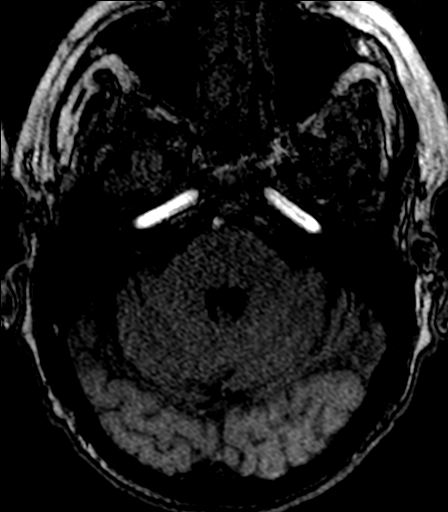
[im 52/169]
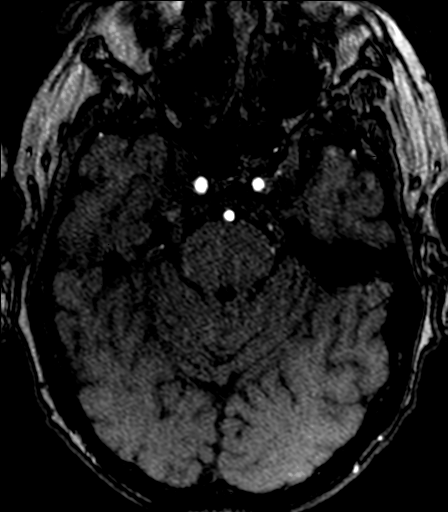
[im 78/169]
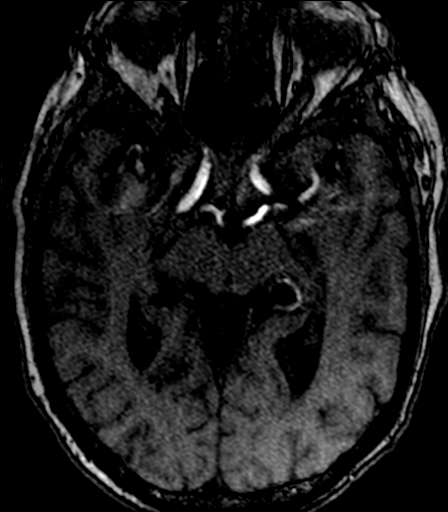
[im 91/169]
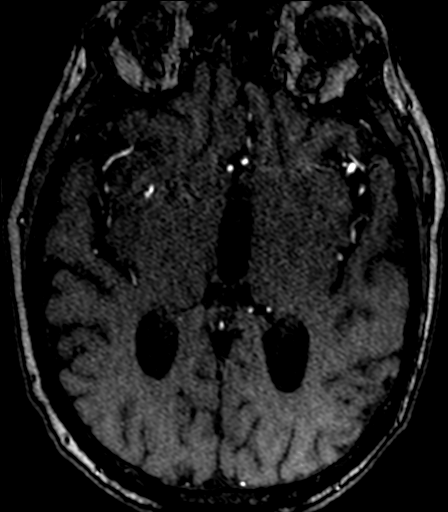
[im 117/169]
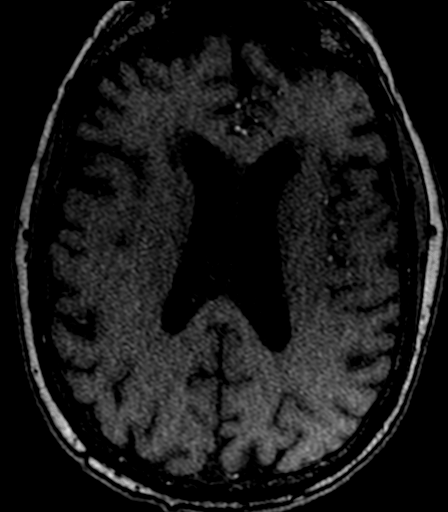
[im 143/169]
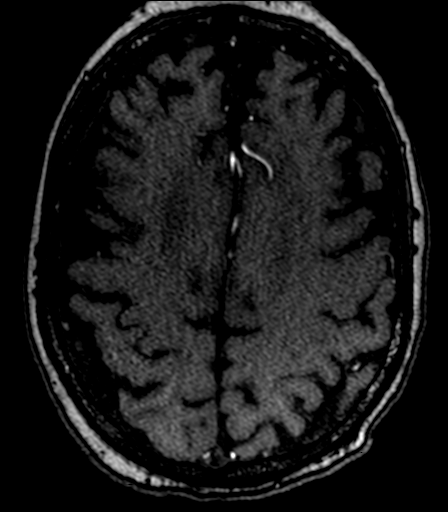
[im 169/169]
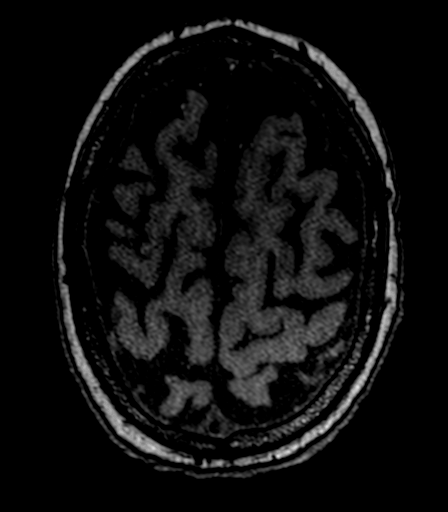

[Series 13: T2 · axial · 5.0mm · 0.45mm/px · z∈[-82,+99]mm · 2 of 29 slices shown (2 of 3)]
[im 1/29]
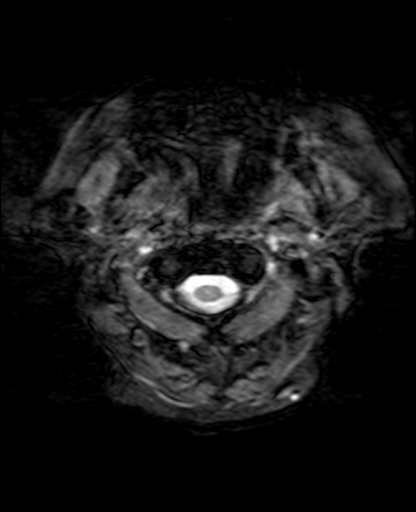
[im 29/29]
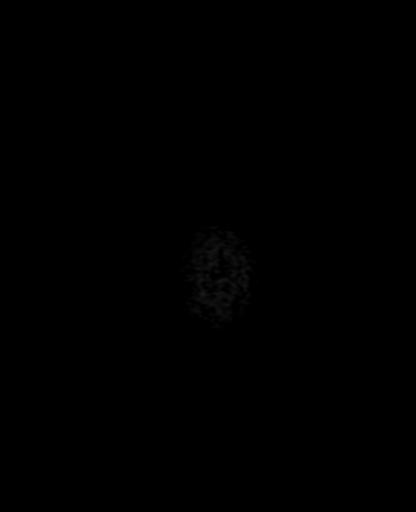

[Series 15: T1 · axial · 3.0mm · 1.00mm/px · z∈[-78,+98]mm · 5 of 60 slices shown (2 of 2)]
[im 1/60]
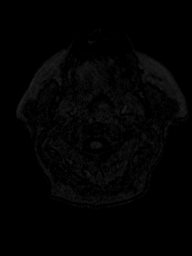
[im 15/60]
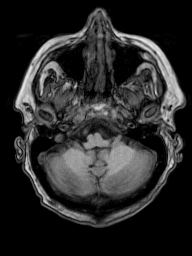
[im 30/60]
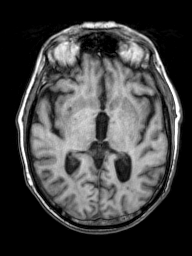
[im 45/60]
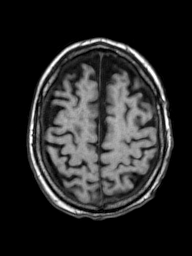
[im 60/60]
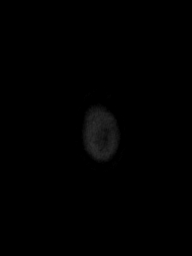

[Series 16: T2 · coronal · 5.0mm · 0.86mm/px · 3 of 31 slices shown (3 of 3)]
[im 1/31]
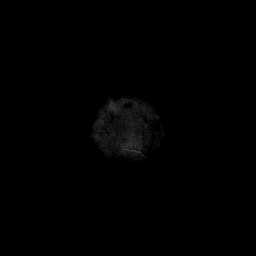
[im 16/31]
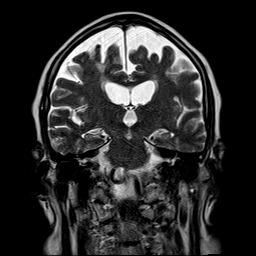
[im 31/31]
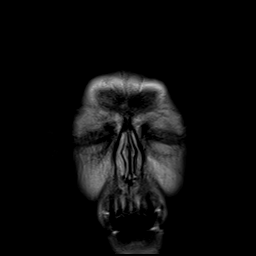

[Series 100: ax (id) · axial · 3.0mm · 1.80mm/px · z∈[-77,+91]mm · 5 of 57 slices shown]
[im 1/57]
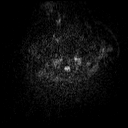
[im 15/57]
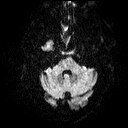
[im 29/57]
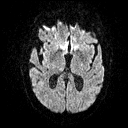
[im 43/57]
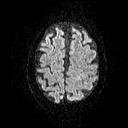
[im 57/57]
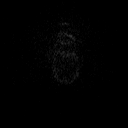

[Series 101: cor (id) · coronal · 3.0mm · 1.80mm/px · 4 of 51 slices shown]
[im 1/51]
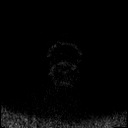
[im 17/51]
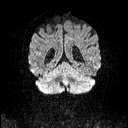
[im 34/51]
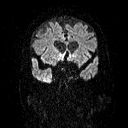
[im 51/51]
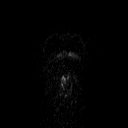

[44 of 48 positions shown; findings below may reference images not displayed]

FINDINGS: MRI HEAD FINDINGS

BRAIN: The midline structures are normal. There is no acute infarct,
acute hemorrhage or mass. Multifocal white matter hyperintensity,
most commonly due to chronic ischemic microangiopathy. There is an
old right frontal subcortical infarct. There is advanced generalized
atrophy without a clear lobar predilection. Blood-sensitive
sequences show no chronic microhemorrhage or superficial siderosis.

SKULL AND UPPER CERVICAL SPINE: The visualized skull base,
calvarium, upper cervical spine and extracranial soft tissues are
normal.

SINUSES/ORBITS: No fluid levels or advanced mucosal thickening. No
mastoid or middle ear effusion. The orbits are normal.

MRA HEAD FINDINGS

Intracranial internal carotid arteries: Normal.

Anterior cerebral arteries: Normal.

Middle cerebral arteries: Normal.

Posterior communicating arteries: Present bilaterally.

Posterior cerebral arteries: Loss of normal flow related enhancement
of the right PCA at the mid P2 segment. Otherwise normal.

Basilar artery: Normal.

Vertebral arteries: Left dominant. Normal.

Superior cerebellar arteries: Normal.

Anterior inferior cerebellar arteries: Not clearly visualized

Posterior inferior cerebellar arteries: Normal.
IMPRESSION: 1. No acute intracranial abnormality.
2. No proximal large vessel occlusion. Diminished flow related
enhancement of the midportion of the right posterior cerebral artery
P2 segment, either severely narrowed or occluded.
3. Old right frontal subcortical infarct and findings of chronic
small vessel disease.
4. Generalized atrophy without a clear lobar predilection.

## 2019-08-12 IMAGING — CT CT HEAD CODE STROKE
3 series · 15 of 47 positions shown, 18 images · non-contrast
Comparison: 11/20/2010

CLINICAL DATA: Code stroke. Subacute neuro deficit. Transient
slurred speech, now at baseline.

EXAM:
CT HEAD WITHOUT CONTRAST
TECHNIQUE: Contiguous axial images were obtained from the base of the skull
through the vertex without intravenous contrast.

[Series 2: head wo · axial · 0.45mm/px · z∈[+137,+267]mm · 9 of 32 slices shown, 12 images]
[im 3/32  brain]
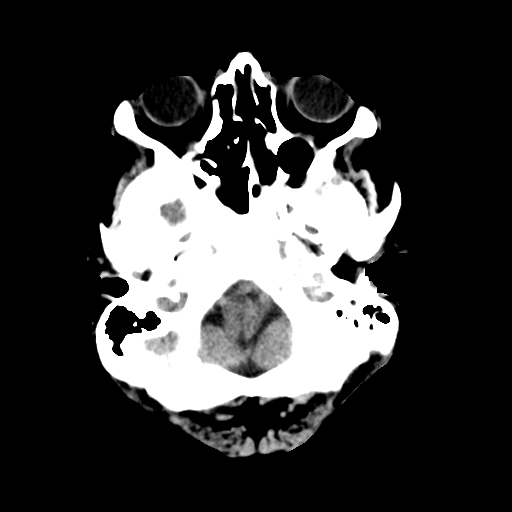
[im 3/32  bone]
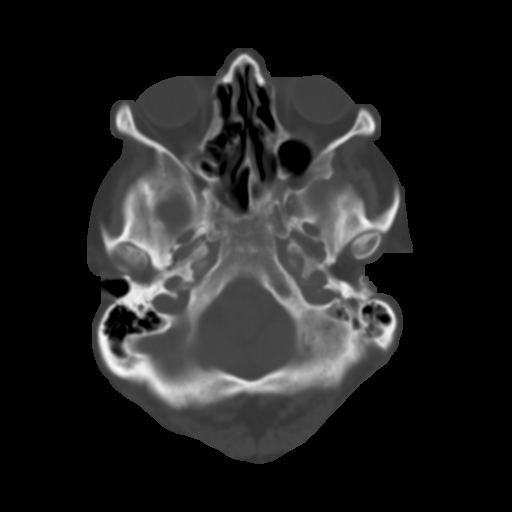
[im 6/32  brain]
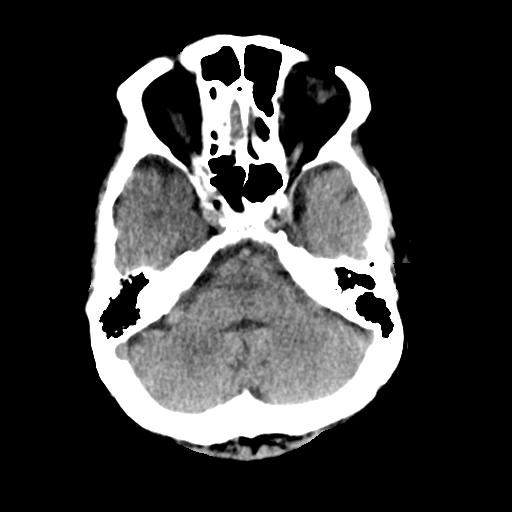
[im 9/32  brain]
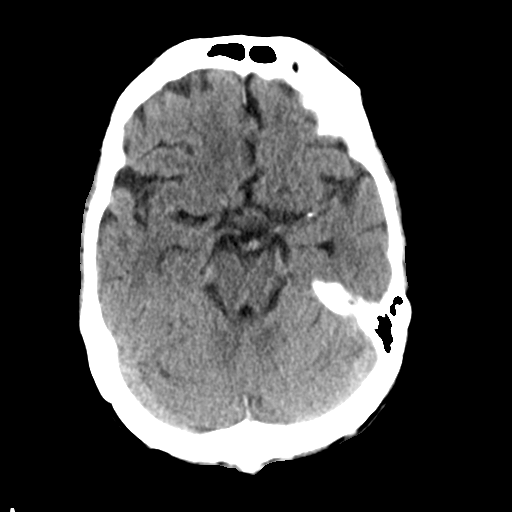
[im 12/32  brain]
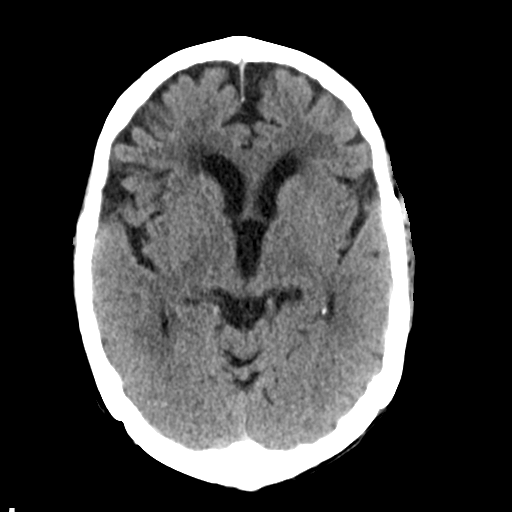
[im 17/32  brain]
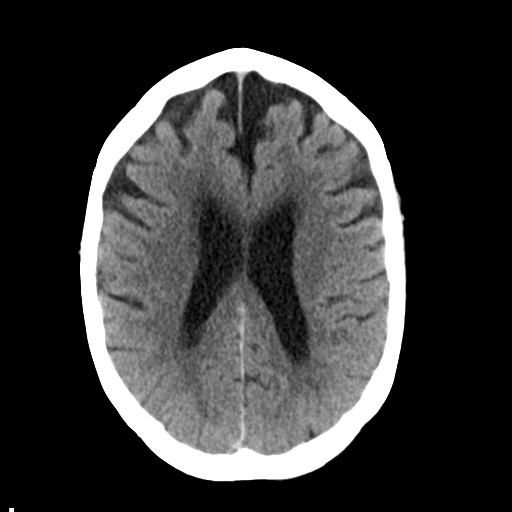
[im 17/32  bone]
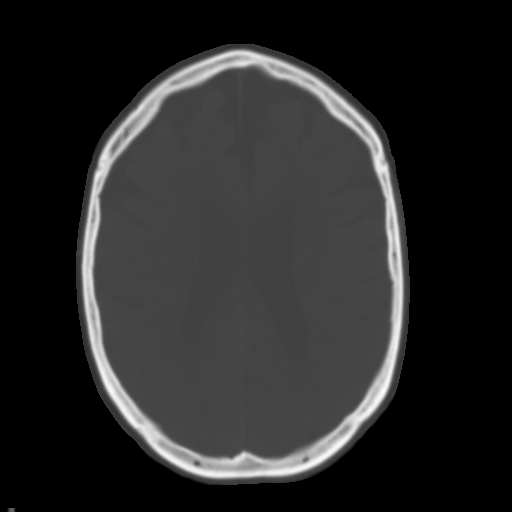
[im 20/32  brain]
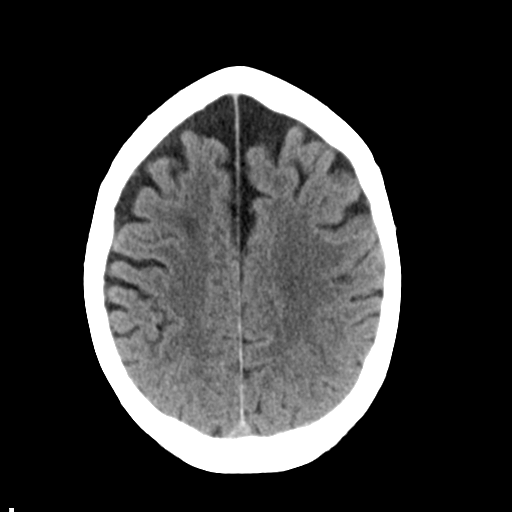
[im 23/32  brain]
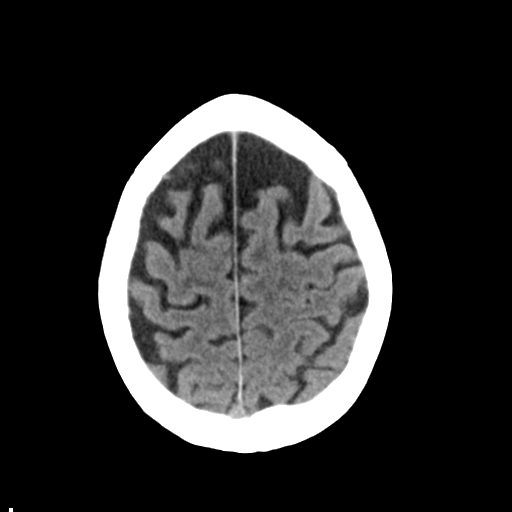
[im 26/32  brain]
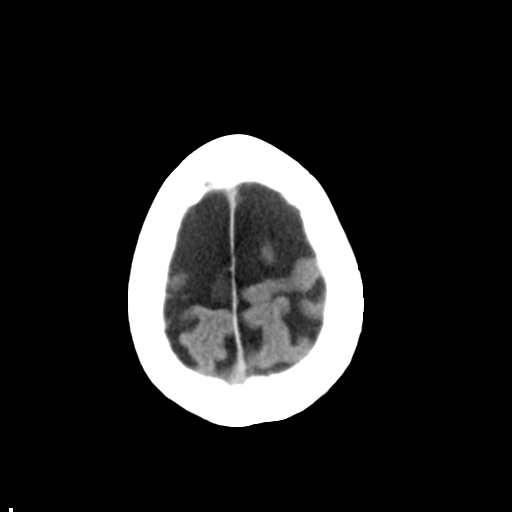
[im 29/32  brain]
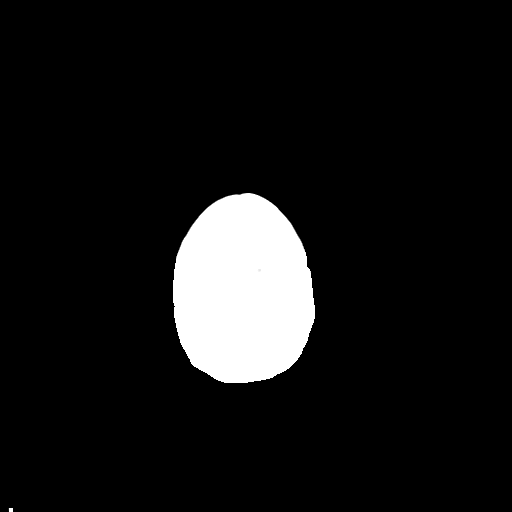
[im 29/32  bone]
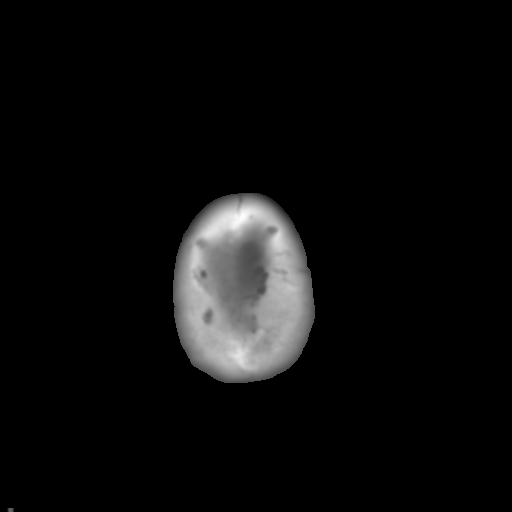

[Series 4: coronal soft tissue · coronal · 0.34mm/px · 3 of 69 slices shown]
[im 23/69  brain]
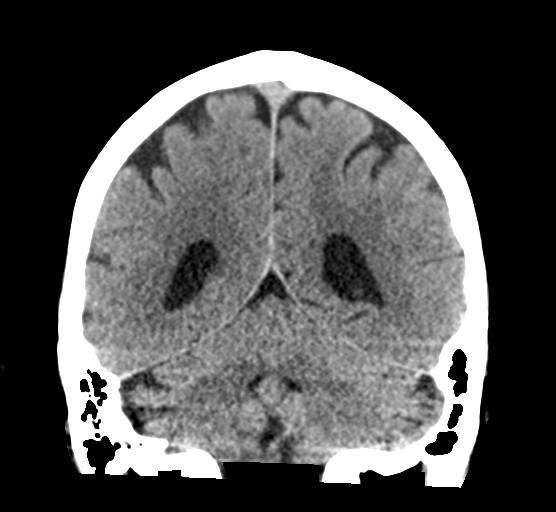
[im 31/69  brain]
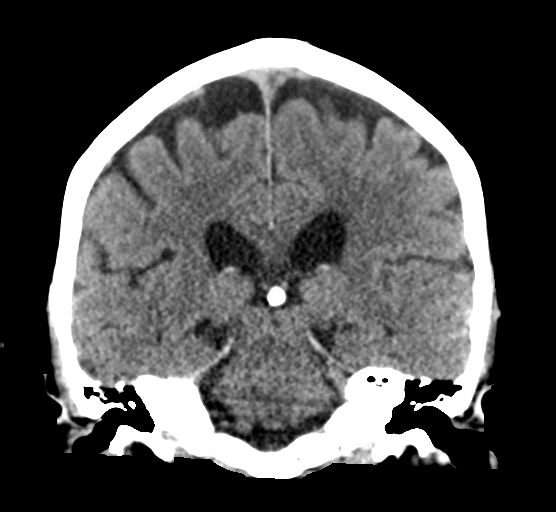
[im 38/69  brain]
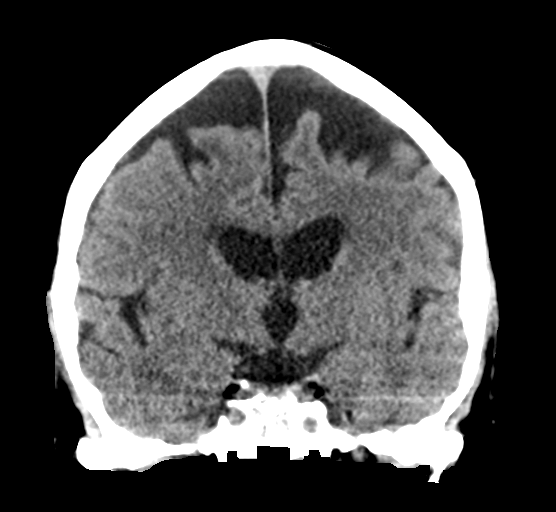

[Series 5: sagittal soft tissue · sagittal · 0.34mm/px · 3 of 55 slices shown]
[im 19/55  brain]
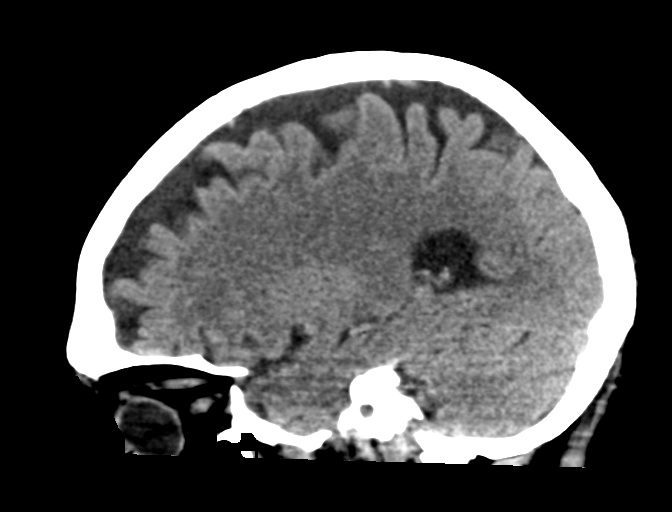
[im 28/55  brain]
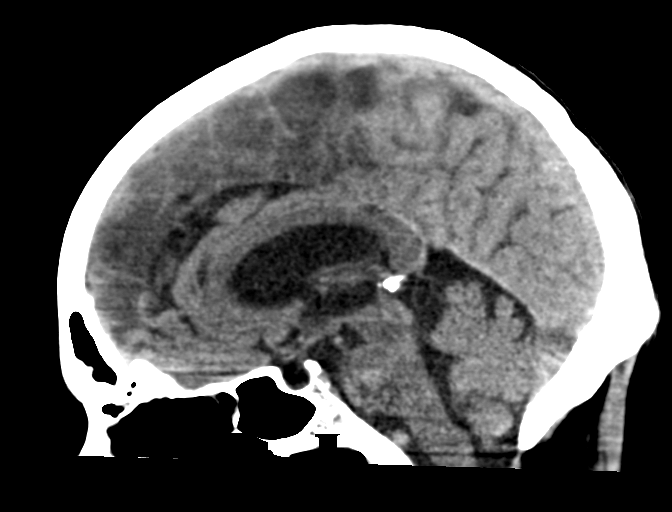
[im 37/55  brain]
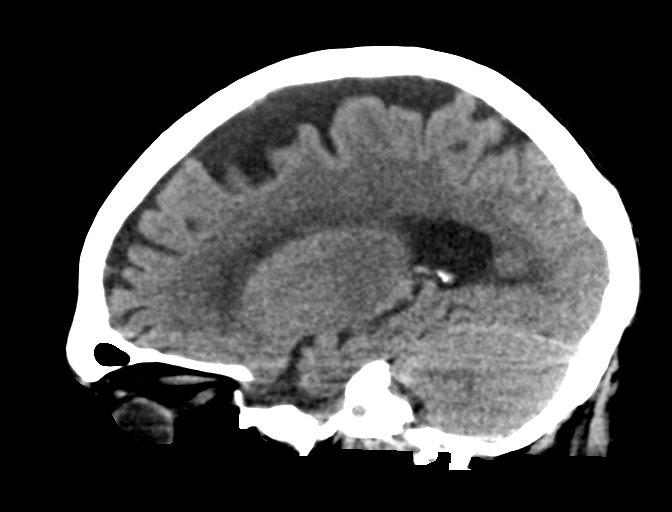

[15 of 47 positions shown; findings below may reference images not displayed]

FINDINGS: Brain: No evidence of acute infarction, hemorrhage, hydrocephalus,
extra-axial collection or mass lesion/mass effect. Small remote high
anterior right frontal cortex infarct. Mild cerebral volume loss
since prior. Mild chronic small vessel ischemia in the cerebral
white matter for age.

Vascular: Atherosclerotic calcifications. Sulcal calcification along
the right sylvian fissure new from 7377, possible calcified
embolism.

Skull: No acute or aggressive finding.

Sinuses/Orbits: Negative

Other: These results were called by telephone at the time of
interpretation on 05/25/2018 at [DATE] to Dr. SAADEDIN TA , who
verbally acknowledged these results.

ASPECTS (Alberta Stroke Program Early CT Score)

- Ganglionic level infarction (caudate, lentiform nuclei, internal
capsule, insula, M1-M3 cortex): 7

- Supraganglionic infarction (M4-M6 cortex): 3

Total score (0-10 with 10 being normal): 10
IMPRESSION: 1. Negative for hemorrhage or acute infarct.ASPECTS is 10.
2. Right sulcal calcification not seen in 7377. This could be a
calcified embolism, age-indeterminate.
3. Small remote right frontal cortex infarct.

## 2019-08-13 DIAGNOSIS — I951 Orthostatic hypotension: Secondary | ICD-10-CM | POA: Diagnosis not present

## 2019-08-13 DIAGNOSIS — I2510 Atherosclerotic heart disease of native coronary artery without angina pectoris: Secondary | ICD-10-CM | POA: Diagnosis not present

## 2019-08-13 DIAGNOSIS — J45909 Unspecified asthma, uncomplicated: Secondary | ICD-10-CM | POA: Diagnosis not present

## 2019-08-13 DIAGNOSIS — I6529 Occlusion and stenosis of unspecified carotid artery: Secondary | ICD-10-CM | POA: Diagnosis not present

## 2019-08-13 DIAGNOSIS — I10 Essential (primary) hypertension: Secondary | ICD-10-CM | POA: Diagnosis not present

## 2019-08-13 DIAGNOSIS — Z48816 Encounter for surgical aftercare following surgery on the genitourinary system: Secondary | ICD-10-CM | POA: Diagnosis not present

## 2019-08-13 DIAGNOSIS — K5909 Other constipation: Secondary | ICD-10-CM | POA: Diagnosis not present

## 2019-08-13 DIAGNOSIS — I872 Venous insufficiency (chronic) (peripheral): Secondary | ICD-10-CM | POA: Diagnosis not present

## 2019-08-13 DIAGNOSIS — I4819 Other persistent atrial fibrillation: Secondary | ICD-10-CM | POA: Diagnosis not present

## 2019-08-16 ENCOUNTER — Ambulatory Visit (INDEPENDENT_AMBULATORY_CARE_PROVIDER_SITE_OTHER): Payer: Medicare PPO | Admitting: *Deleted

## 2019-08-16 ENCOUNTER — Other Ambulatory Visit: Payer: Self-pay

## 2019-08-16 DIAGNOSIS — N319 Neuromuscular dysfunction of bladder, unspecified: Secondary | ICD-10-CM

## 2019-08-16 NOTE — Progress Notes (Signed)
Cath Change/ Replacement  Patient is present today for a catheter change due to urinary retention.  46ml of water was removed from the balloon, a 16FR foley cath was removed with out difficulty.  Patient was cleaned and prepped in a sterile fashion with betadine and 2% lidocaine jelly was instilled into the urethra after waiting for about 10 minutes A 16FR foley cath was replaced into the bladder no complications were noted Urine return was noted 98ml and urine was yellow in color. The balloon was filled with 41ml of sterile water. It was attached to his bag for drainage. Patient was given proper instruction on catheter care.    Performed by: Verlene Mayer, CMA  Follow up: 4 weeks

## 2019-08-20 ENCOUNTER — Telehealth: Payer: Self-pay | Admitting: Pulmonary Disease

## 2019-08-20 NOTE — Telephone Encounter (Signed)
Xolair Prefilled Syringe Order: 150mg  Prefilled Syringe:  #2 75mg  Prefilled Syringe: #1 Ordered Date: 08/20/19 Expected date of arrival: 08/21/19 Ordered by: Abid Bolla,LPN [facility]: [facility]

## 2019-08-21 ENCOUNTER — Other Ambulatory Visit: Payer: Self-pay

## 2019-08-21 ENCOUNTER — Ambulatory Visit (INDEPENDENT_AMBULATORY_CARE_PROVIDER_SITE_OTHER): Payer: Medicare PPO

## 2019-08-21 DIAGNOSIS — J4550 Severe persistent asthma, uncomplicated: Secondary | ICD-10-CM | POA: Diagnosis not present

## 2019-08-21 MED ORDER — OMALIZUMAB 75 MG/0.5ML ~~LOC~~ SOSY
75.0000 mg | PREFILLED_SYRINGE | Freq: Once | SUBCUTANEOUS | Status: AC
  Administered 2019-08-21: 16:00:00 75 mg via SUBCUTANEOUS

## 2019-08-21 MED ORDER — OMALIZUMAB 150 MG/ML ~~LOC~~ SOSY
300.0000 mg | PREFILLED_SYRINGE | Freq: Once | SUBCUTANEOUS | Status: AC
  Administered 2019-08-21: 16:00:00 300 mg via SUBCUTANEOUS

## 2019-08-21 NOTE — Progress Notes (Signed)
Have you been hospitalized within the last 10 days?  No Do you have a fever?  No Do you have a cough?  No Do you have a headache or sore throat? No Do you have your Epi Pen visible and is it within date?  Yes 

## 2019-08-21 NOTE — Telephone Encounter (Signed)
Xolair Prefilled Syringe Received:  150mg  Prefilled Syringe >> quantity #2, lot # [id number], exp date 04/28/2020 75mg  Prefilled Syringe >> quantity #1, lot # [id number], exp date 03/28/2020 Medication arrival date: 08/21/19 Received by: Albena Comes,LPN

## 2019-08-27 DIAGNOSIS — R0982 Postnasal drip: Secondary | ICD-10-CM | POA: Diagnosis not present

## 2019-08-27 DIAGNOSIS — H6123 Impacted cerumen, bilateral: Secondary | ICD-10-CM | POA: Diagnosis not present

## 2019-08-27 DIAGNOSIS — H903 Sensorineural hearing loss, bilateral: Secondary | ICD-10-CM | POA: Diagnosis not present

## 2019-08-28 ENCOUNTER — Telehealth: Payer: Self-pay | Admitting: Pulmonary Disease

## 2019-08-28 NOTE — Telephone Encounter (Signed)
Xolair Prefilled Syringe Order: 150mg  Prefilled Syringe:  #1 75mg  Prefilled Syringe: #1 Ordered Date: 08/28/2019 Expected date of arrival: 08/29/2019 Ordered by: Lattie Haw, LPN [facility]

## 2019-08-29 DIAGNOSIS — D692 Other nonthrombocytopenic purpura: Secondary | ICD-10-CM | POA: Diagnosis not present

## 2019-08-29 DIAGNOSIS — L821 Other seborrheic keratosis: Secondary | ICD-10-CM | POA: Diagnosis not present

## 2019-08-29 DIAGNOSIS — L218 Other seborrheic dermatitis: Secondary | ICD-10-CM | POA: Diagnosis not present

## 2019-08-29 NOTE — Telephone Encounter (Signed)
Xolair Prefilled Syringe Received:  150mg  Prefilled Syringe >> quantity #1, lot # [id number], exp date 04/2020 75mg  Prefilled Syringe >> quantity #1, lot # [id number], exp date 03/2020 Medication arrival date: 08/29/2019 Received by: Desmond Dike, [place]

## 2019-09-05 ENCOUNTER — Ambulatory Visit (INDEPENDENT_AMBULATORY_CARE_PROVIDER_SITE_OTHER): Payer: Medicare PPO

## 2019-09-05 DIAGNOSIS — Z23 Encounter for immunization: Secondary | ICD-10-CM | POA: Diagnosis not present

## 2019-09-06 ENCOUNTER — Ambulatory Visit: Payer: Medicare PPO

## 2019-09-09 ENCOUNTER — Other Ambulatory Visit: Payer: Self-pay

## 2019-09-09 ENCOUNTER — Ambulatory Visit (INDEPENDENT_AMBULATORY_CARE_PROVIDER_SITE_OTHER): Payer: Medicare PPO

## 2019-09-09 DIAGNOSIS — J4550 Severe persistent asthma, uncomplicated: Secondary | ICD-10-CM

## 2019-09-09 MED ORDER — OMALIZUMAB 150 MG/ML ~~LOC~~ SOSY
300.0000 mg | PREFILLED_SYRINGE | Freq: Once | SUBCUTANEOUS | Status: AC
  Administered 2019-09-09: 300 mg via SUBCUTANEOUS

## 2019-09-09 MED ORDER — OMALIZUMAB 75 MG/0.5ML ~~LOC~~ SOSY
75.0000 mg | PREFILLED_SYRINGE | Freq: Once | SUBCUTANEOUS | Status: AC
  Administered 2019-09-09: 75 mg via SUBCUTANEOUS

## 2019-09-09 NOTE — Progress Notes (Signed)
Have you been hospitalized within the last 10 days?  No Do you have a fever?  No Do you have a cough?  No Do you have a headache or sore throat? No Do you have your Epi Pen visible and is it within date?  Yes 

## 2019-09-12 DIAGNOSIS — D61818 Other pancytopenia: Secondary | ICD-10-CM | POA: Insufficient documentation

## 2019-09-12 DIAGNOSIS — E871 Hypo-osmolality and hyponatremia: Secondary | ICD-10-CM | POA: Insufficient documentation

## 2019-09-12 DIAGNOSIS — R600 Localized edema: Secondary | ICD-10-CM | POA: Insufficient documentation

## 2019-09-12 DIAGNOSIS — D631 Anemia in chronic kidney disease: Secondary | ICD-10-CM | POA: Insufficient documentation

## 2019-09-13 ENCOUNTER — Ambulatory Visit (INDEPENDENT_AMBULATORY_CARE_PROVIDER_SITE_OTHER): Payer: Medicare PPO

## 2019-09-13 ENCOUNTER — Other Ambulatory Visit: Payer: Self-pay

## 2019-09-13 DIAGNOSIS — N319 Neuromuscular dysfunction of bladder, unspecified: Secondary | ICD-10-CM | POA: Diagnosis not present

## 2019-09-13 DIAGNOSIS — H3561 Retinal hemorrhage, right eye: Secondary | ICD-10-CM | POA: Diagnosis not present

## 2019-09-13 NOTE — Progress Notes (Signed)
Cath Change/ Replacement  Patient is present today for a catheter change due to urinary retention.  56ml of water was removed from the balloon, a 16FR foley cath was removed with out difficulty.  Patient was cleaned and prepped in a sterile fashion with betadine and 2% lidocaine jelly was instilled into the urethra after waiting for about 10 minutes A 16FR foley cath was replaced into the bladder no complications were noted Urine return was noted 53ml and urine was yellow in color. The balloon was filled with 43ml of sterile water. It was attached to his bag for drainage. Patient was given proper instruction on catheter care.    Performed by Gaspar Cola  CMA  Follow up: 4 weeks

## 2019-09-16 ENCOUNTER — Telehealth: Payer: Self-pay | Admitting: Pulmonary Disease

## 2019-09-16 DIAGNOSIS — R600 Localized edema: Secondary | ICD-10-CM | POA: Diagnosis not present

## 2019-09-16 DIAGNOSIS — E871 Hypo-osmolality and hyponatremia: Secondary | ICD-10-CM | POA: Diagnosis not present

## 2019-09-16 DIAGNOSIS — D649 Anemia, unspecified: Secondary | ICD-10-CM | POA: Diagnosis not present

## 2019-09-16 DIAGNOSIS — N182 Chronic kidney disease, stage 2 (mild): Secondary | ICD-10-CM | POA: Diagnosis not present

## 2019-09-16 NOTE — Telephone Encounter (Signed)
Xolair Prefilled Syringe Order: 150mg  Prefilled Syringe:  #4 75mg  Prefilled Syringe: #2 Ordered Date: 09/16/19 Expected date of arrival: 09/17/19 Ordered by: [place]: [facility]

## 2019-09-17 NOTE — Telephone Encounter (Signed)
Xolair Prefilled Syringe Received:  150mg  Prefilled Syringe >> quantity #4, lot # [id number], exp date 04/2020 75mg  Prefilled Syringe >> quantity #2, lot # [id number], exp date 03/2020 Medication arrival date: 09/17/2019 Received by: Desmond Dike, [place]

## 2019-09-18 ENCOUNTER — Other Ambulatory Visit: Payer: Self-pay | Admitting: Internal Medicine

## 2019-09-19 NOTE — Telephone Encounter (Signed)
Xanax Last filled 08-01-19 #90 Last OV 07-11-19 Next OV 11-11-19 [facility] [address] [employer]

## 2019-09-20 DIAGNOSIS — H3561 Retinal hemorrhage, right eye: Secondary | ICD-10-CM | POA: Diagnosis not present

## 2019-09-24 ENCOUNTER — Ambulatory Visit: Payer: Medicare PPO

## 2019-09-25 ENCOUNTER — Ambulatory Visit: Payer: Medicare PPO

## 2019-09-25 ENCOUNTER — Other Ambulatory Visit: Payer: Self-pay | Admitting: Urology

## 2019-09-27 ENCOUNTER — Ambulatory Visit (INDEPENDENT_AMBULATORY_CARE_PROVIDER_SITE_OTHER): Payer: Medicare PPO

## 2019-09-27 ENCOUNTER — Other Ambulatory Visit: Payer: Self-pay

## 2019-09-27 DIAGNOSIS — J4550 Severe persistent asthma, uncomplicated: Secondary | ICD-10-CM | POA: Diagnosis not present

## 2019-09-27 MED ORDER — OMALIZUMAB 75 MG/0.5ML ~~LOC~~ SOSY
75.0000 mg | PREFILLED_SYRINGE | Freq: Once | SUBCUTANEOUS | Status: AC
  Administered 2019-09-27: 75 mg via SUBCUTANEOUS

## 2019-09-27 MED ORDER — OMALIZUMAB 150 MG/ML ~~LOC~~ SOSY
300.0000 mg | PREFILLED_SYRINGE | Freq: Once | SUBCUTANEOUS | Status: AC
  Administered 2019-09-27: 300 mg via SUBCUTANEOUS

## 2019-09-27 NOTE — Progress Notes (Signed)
All questions were answered by the patient before medication was administered. Have you been hospitalized in the last 10 days? No Do you have a fever? No Do you have a cough? No Do you have a headache or sore throat? No  

## 2019-09-30 ENCOUNTER — Telehealth: Payer: Self-pay | Admitting: Pulmonary Disease

## 2019-09-30 NOTE — Telephone Encounter (Signed)
Can we set up a video visit with one of the NPs to assess further?

## 2019-09-30 NOTE — Telephone Encounter (Signed)
Spoke with patient. He is aware of VS' recs. Offered to schedule a MyChart video visit with patient but he stated that he did not have the proper equipment for a video visit. He has been scheduled for a televisit with Aaron Edelman tomorrow at 2pm.  Nothing further needed at time of call.

## 2019-09-30 NOTE — Progress Notes (Addendum)
@Patient  ID: [name], male    DOB: 07/23/1934, 85 y.o.   MRN: [id number]  Chief Complaint  Patient presents with  . Follow-up    had Xolair injection, injection had pain at site, increased swelling, no redness, warm to touch, is better today, but still has pain while using right arm    Referring provider: [name], MD  HPI:  85 year old male never smoker followed in our office for asthma  PMH: Anxiety, hypertension, GERD hyperlipidemia A. fib, rheumatoid arthritis Smoker/ Smoking History: Never smoker Maintenance: Xolair, methotrexate, Plaquenil Pt of: Dr. Halford Chessman  10/01/2019  - Visit   85 year old male never smoker followed in our office for asthma.  Patient is followed by Dr. Halford Chessman.  Patient also with known past medical history for rheumatoid arthritis which she is managed on methotrexate as well as Plaquenil.  Patient also receives maintenance injections of Xolair.  Patient contacted our office earlier this week to report that since receiving his Xolair injection he has had a reaction at the injection site.  Apparently he is also had worsened arm soreness.  Patient presents today for an in person evaluation to further assess.  Patient reporting that after receiving Xolair injection last week he had immediate pain in his right upper arm.  There was no red redness or swelling.  Patient has difficulty lifting his arm which is a chronic problem but worsened acutely after the injection.  Site is tender to palpation.  Site does not feel warm today but patient reports that spouse felt that it felt hot yesterday.  Patient never had increased shortness of breath or heart racing.  Patient never endorsed cough or wheezing.  Patient would like to remain on Xolair if at all possible as he reports it has significantly improved his breathing ever since starting to take it.  Patient is significant muscle wasting likely directly related to methotrexate, chronic steroid use, and sedentary  lifestyle.  Tests:   Pulmonary tests:  PFT 04/07/10 >> FEV1 2.61 (86%), FEV1% 73, TLC 7.26 (85%), TLC 112%  Sleep tests:  CT chest 03/15/17 > atherosclerosis, 3.4 cm Lt lung base density rounded ATX, granuloma in lingula, 3 mm RLL nodule, 4 mm RLL nodule CT chest 09/18/17 >>atherosclerosis, rounded ATX LLL, stable 4 mm RLL nodule, calcified granuloma in lingula, stable 4 mm RLL nodule  Cardiac tests:  Echo 05/26/18 >> EF 60 to 65%, grade 1 DD, mild AS, mild AR, mild MR  FENO:  No results found for: NITRICOXIDE  PFT: No flowsheet data found.  WALK:  No flowsheet data found.  Imaging: No results found.  Lab Results:  CBC    Component Value Date/Time   WBC 4.7 06/22/2019 0535   RBC 2.40 (L) 06/22/2019 0535   HGB 8.5 (L) 06/22/2019 1456   HGB 11.1 (L) 09/28/2014 1610   HCT 25.5 (L) 06/22/2019 1456   HCT 33.6 (L) 09/28/2014 1610   PLT 188 06/22/2019 0535   PLT 192 09/28/2014 1610   MCV 96.7 06/22/2019 0535   MCV 96 09/28/2014 1610   MCH 32.1 06/22/2019 0535   MCHC 33.2 06/22/2019 0535   RDW 14.6 06/22/2019 0535   RDW 16.9 (H) 09/28/2014 1610   LYMPHSABS 0.8 (L) 05/25/2018 1953   LYMPHSABS 1.3 09/09/2014 0652   MONOABS 0.4 05/25/2018 1953   MONOABS 0.4 09/09/2014 0652   EOSABS 0.3 05/25/2018 1953   EOSABS 0.5 09/09/2014 0652   BASOSABS 0.0 05/25/2018 1953   BASOSABS 0.1 09/09/2014 [id number]  BMET    Component Value Date/Time   NA 132 (L) 06/22/2019 0535   NA 138 09/28/2014 1610   K 4.1 06/22/2019 0535   K 4.6 09/28/2014 1610   CL 100 06/22/2019 0535   CL 101 09/28/2014 1610   CO2 25 06/22/2019 0535   CO2 30 09/28/2014 1610   GLUCOSE 100 (H) 06/22/2019 0535   GLUCOSE 137 (H) 09/28/2014 1610   GLUCOSE 109 (H) 10/05/2006 1611   BUN 27 (H) 06/22/2019 0535   BUN 16 09/28/2014 1610   CREATININE 0.87 06/22/2019 0535   CREATININE 0.62 09/28/2014 1610   CALCIUM 7.4 (L) 06/22/2019 0535   CALCIUM 8.2 (L) 09/28/2014 1610   GFRNONAA >60 06/22/2019 0535   GFRNONAA  >60 09/28/2014 1610   GFRNONAA >60 08/06/2014 1339   GFRAA >60 06/22/2019 0535   GFRAA >60 09/28/2014 1610   GFRAA >60 08/06/2014 1339    BNP No results found for: BNP  ProBNP No results found for: PROBNP  Specialty Problems      Pulmonary Problems   Allergic asthma     RAST and IgE check on 07/23/09. His IgE was 527.7.       Chronic sinusitis   Asthma with acute exacerbation      Allergies  Allergen Reactions  . Ciprofloxacin Nausea And Vomiting    Headache  . Citalopram Hydrobromide Other (See Comments)    "unknown"  . Clindamycin Nausea And Vomiting  . Lorazepam Other (See Comments)    Adverse reaction  . Paroxetine Nausea Only  . Ramipril Other (See Comments)    "unknown"  . Simvastatin   . Sulfa Antibiotics Other (See Comments)    Immunization History  Administered Date(s) Administered  . Fluad Quad(high Dose 65+) 09/05/2019  . Influenza Split 09/26/2011  . Influenza Whole 09/07/2007, 08/21/2008, 09/01/2009, 08/23/2010  . Influenza,inj,Quad PF,6+ Mos 09/04/2013, 07/28/2014, 08/20/2015, 08/08/2016, 09/05/2017, 09/03/2018  . Influenza-Unspecified 09/06/2012  . Pneumococcal Conjugate-13 07/28/2014  . Pneumococcal Polysaccharide-23 04/09/2012  . Td 07/13/2009    Past Medical History:  Diagnosis Date  . (HFpEF) heart failure with preserved ejection fraction ([place])    a. 02/2019 Echo: Ef 60-65%, mildly reduced RV fxn. RVSP 44mmHg. Mildly dil LA. Mod dil RA. Mild to mod TR. Mild to mod AS. Triv AI.  Marland Kitchen Allergy   . Anemia   . Anxiety   . Arthritis    rheumatoid  . Asthma   . Basal cell carcinoma    back  . CAD (coronary artery disease)   . CHF (congestive heart failure) ([place])   . Chronic kidney disease    Mass right kidney  . Chronic sinusitis   . Collagen vascular disease (HCC)    Rheumatoid Arthritis  . Depression   . Diverticulitis   . Dysrhythmia   . ED (erectile dysfunction)   . GERD (gastroesophageal reflux disease)   . History of SIADH    . Hx of adenomatous colonic polyps   . Hypertension   . Hyponatremia   . IBS (irritable bowel syndrome)   . Neurodermatitis   . Neurogenic bladder   . OSA (obstructive sleep apnea)    no CPAP since weight loss  . Persistent atrial fibrillation ([place])    a. Dx 02/2019; b. CHA2DS2VASc = 6-->eliquis initiated.  . Prostate cancer ([place])   . Renal mass    10/2018 4.2cm R renal cortical mass. Most compatible w/ clear cell renal cell carcinoma.    Tobacco History: Social History   Tobacco Use  Smoking  Status Never Smoker  Smokeless Tobacco Never Used   Counseling given: Yes   Continue to not smoke  Outpatient Encounter Medications as of 10/01/2019  Medication Sig  . acetaminophen (TYLENOL) 500 MG tablet Take 500 mg by mouth every 8 (eight) hours as needed for moderate pain or headache.   . albuterol (PROVENTIL HFA;VENTOLIN HFA) 108 (90 Base) MCG/ACT inhaler Inhale 2 puffs into the lungs every 6 (six) hours as needed for wheezing or shortness of breath.  . ALPRAZolam (XANAX) 0.5 MG tablet TAKE 1 TABLET BY MOUTH THREE TIMES DAILY AS NEEDED FOR ANXIETY OR SLEEP  . apixaban (ELIQUIS) 5 MG TABS tablet Take 1 tablet (5 mg total) by mouth 2 (two) times daily.  . Cholecalciferol (VITAMIN D3) 5000 UNITS CAPS Take 5,000 Units by mouth daily.   . Coenzyme Q10 (CO Q 10) 100 MG CAPS Take 100 mg by mouth daily.   . fluticasone (FLONASE) 50 MCG/ACT nasal spray Place 2 sprays into both nostrils 2 (two) times daily.  . folic acid (FOLVITE) A999333 MCG tablet Take 1,200 mcg by mouth daily.  . furosemide (LASIX) 20 MG tablet Take 1 tablet (20 mg total) by mouth daily.  Marland Kitchen gabapentin (NEURONTIN) 300 MG capsule TAKE 1 CAPSULE BY MOUTH THREE TIMES DAILY (Patient taking differently: Take 300 mg by mouth 3 (three) times daily. )  . hydroxychloroquine (PLAQUENIL) 200 MG tablet Take 200 mg by mouth 2 (two) times daily.   . methotrexate (RHEUMATREX) 2.5 MG tablet Take 15 mg by mouth once a week. 6 tablets=15mg  on  Thursday  . metoprolol tartrate (LOPRESSOR) 25 MG tablet Take 0.5 tablets (12.5 mg total) by mouth 2 (two) times daily.  . Multiple Vitamin (MULTIVITAMIN) tablet Take 1 tablet by mouth daily.    Marland Kitchen MYRBETRIQ 50 MG TB24 tablet TAKE 1 TABLET(50 MG) BY MOUTH DAILY  . polyethylene glycol powder (GLYCOLAX/MIRALAX) 17 GM/SCOOP powder Take 17 g by mouth at bedtime.   . pravastatin (PRAVACHOL) 20 MG tablet Take 1 tablet (20 mg total) by mouth at bedtime.  . Probiotic Product (ALIGN) 4 MG CAPS Take 1 capsule (4 mg total) by mouth daily.  . Selenium 200 MCG CAPS Take 200 mcg by mouth daily.   . sodium chloride 1 g tablet Take 1 tablet (1 g total) by mouth 2 (two) times daily with a meal. (Patient taking differently: Take 1 g by mouth 3 (three) times daily with meals. )  . Turmeric 500 MG CAPS Take 500 mg by mouth daily.    Facility-Administered Encounter Medications as of 10/01/2019  Medication  . omalizumab Arvid Right) prefilled syringe 300 mg  . omalizumab Arvid Right) prefilled syringe 75 mg     Review of Systems  Review of Systems  Constitutional: Positive for fatigue. Negative for activity change, chills, fever and unexpected weight change.  HENT: Negative for postnasal drip, rhinorrhea, sinus pressure, sinus pain and sore throat.   Eyes: Negative.   Respiratory: Negative for cough, shortness of breath and wheezing.   Cardiovascular: Negative for chest pain and palpitations.  Gastrointestinal: Negative for constipation, diarrhea, nausea and vomiting.  Endocrine: Negative.   Genitourinary: Negative.   Musculoskeletal: Positive for arthralgias (RA, following ) and joint swelling.  Skin: Negative.   Neurological: Positive for weakness. Negative for dizziness, facial asymmetry and headaches.  Psychiatric/Behavioral: Negative.  Negative for dysphoric mood. The patient is not nervous/anxious.   All other systems reviewed and are negative.    Physical Exam  BP 118/64 (BP Location: Left Arm, Cuff  Size: Normal)   Pulse 69   Ht 5\' 9"  (1.753 m)   Wt 182 lb 6.4 oz (82.7 kg)   SpO2 99%   BMI 26.94 kg/m   Wt Readings from Last 5 Encounters:  10/01/19 182 lb 6.4 oz (82.7 kg)  08/09/19 172 lb 8 oz (78.2 kg)  07/17/19 177 lb (80.3 kg)  07/11/19 179 lb (81.2 kg)  06/17/19 176 lb 3.2 oz (79.9 kg)    BMI Readings from Last 5 Encounters:  10/01/19 26.94 kg/m  08/09/19 25.47 kg/m  07/17/19 26.14 kg/m  07/11/19 26.43 kg/m  06/17/19 26.02 kg/m     Physical Exam Vitals signs and nursing note reviewed.  Constitutional:      General: He is not in acute distress.    Appearance: Normal appearance. He is normal weight.     Comments: Chronically ill elderly male  HENT:     Head: Normocephalic and atraumatic.     Right Ear: Hearing, tympanic membrane, ear canal and external ear normal.     Left Ear: Hearing, tympanic membrane, ear canal and external ear normal.     Nose: Nose normal. No mucosal edema or rhinorrhea.     Right Turbinates: Not enlarged.     Left Turbinates: Not enlarged.     Mouth/Throat:     Mouth: Mucous membranes are dry.     Pharynx: Oropharynx is clear. No oropharyngeal exudate.  Eyes:     Pupils: Pupils are equal, round, and reactive to light.  Neck:     Musculoskeletal: Normal range of motion.  Cardiovascular:     Rate and Rhythm: Normal rate and regular rhythm.     Pulses: Normal pulses.          Radial pulses are 2+ on the right side and 2+ on the left side.     Heart sounds: Normal heart sounds. No murmur.     Comments: Brachial pulse 2+ bilaterally Pulmonary:     Effort: Pulmonary effort is normal. No respiratory distress.     Breath sounds: Normal breath sounds. No decreased breath sounds, wheezing or rales.  Musculoskeletal:     Right shoulder: He exhibits decreased range of motion and tenderness.     Left shoulder: He exhibits decreased range of motion.     Right elbow: He exhibits decreased range of motion. He exhibits no swelling. Tenderness  found.     Left elbow: He exhibits decreased range of motion.     Right lower leg: No edema.     Left lower leg: No edema.     Comments: Muscle atrophy in right upper extremities bilaterally  Tenderness to right upper extremity palpation around injection site  Lymphadenopathy:     Cervical: No cervical adenopathy.  Skin:    General: Skin is warm and dry.     Capillary Refill: Capillary refill takes less than 2 seconds.     Findings: Bruising (Light bruising scattered throughout upper extremities) present. No erythema or rash.       Neurological:     General: No focal deficit present.     Mental Status: He is alert and oriented to person, place, and time.     Motor: No weakness.     Coordination: Coordination normal.     Gait: Gait abnormal.  Psychiatric:        Mood and Affect: Mood normal.        Behavior: Behavior normal. Behavior is cooperative.        Thought Content:  Thought content normal.        Judgment: Judgment normal.       Assessment & Plan:   Allergic asthma Discussion: After discussion with patient today as well as evaluation of right upper extremity I believe it is reasonable to not consider this a injection site reaction.  I do believe that with patient's muscle atrophy that likely an injection site may have caused trauma or soreness to the muscle.  Patient had previously received injections in his hip.  He stopped doing this during the COVID-19 pandemic.  He is interested in resuming hip injections in the future.  We likely will need to consider doing hip injections in the biologic lab if able to coordinate or in an exam room where patient could lay down on exam table comfortably to receive the injections.  Plan: Continue Xolair injections I will follow-up with the biologic team to ensure that you can resume hip injections as well as you maintain your Xolair injection schedule I will follow-up with clinical pharmacy team to ensure there are no abnormal  reactions to Xolair that I may be unaware of At next injection patient will need to be monitored in our office for 30 minutes to an hour to ensure no repeat injection site irritation  We will follow-up with Dr. Halford Chessman to discuss your case but I believe for right now it is reasonable to continue Xolair  Chronic rheumatic arthritis ([place]) Plan: Continue methotrexate Continue Plaquenil Continue follow-up with primary care and/or rheumatology    Return in about 6 weeks (around 11/12/2019), or if symptoms worsen or fail to improve, for Follow up with Dr. Halford Chessman.   Lauraine Rinne, NP 10/01/2019   This appointment was 35 minutes long with over 50% of the time in direct face-to-face patient care, assessment, plan of care, and follow-up.

## 2019-09-30 NOTE — Telephone Encounter (Signed)
Spoke with patient who reported that he received his Xolair injections on this past Friday - received 300mg  in the left arm; 75mg  in the right arm.    Patient stated that for some reason this time he had a lot of discomfort when receiving the injection in his right arm with redness and heat later Friday and into Saturday and now the arm is very sore/tender and it is difficult for him to use the arm or lift it.  There is no redness or heat today and spouse reported over speaker phone that patient's injection site looks "normal."  Patient denied experiencing any SHOB, difficulty breathing, cough, swelling in the tongue or throat, hives or rash.  Dr Halford Chessman please advise, thank you.

## 2019-10-01 ENCOUNTER — Encounter: Payer: Self-pay | Admitting: Pulmonary Disease

## 2019-10-01 ENCOUNTER — Other Ambulatory Visit: Payer: Self-pay

## 2019-10-01 ENCOUNTER — Ambulatory Visit (INDEPENDENT_AMBULATORY_CARE_PROVIDER_SITE_OTHER): Payer: Medicare PPO | Admitting: Pulmonary Disease

## 2019-10-01 VITALS — BP 118/64 | HR 69 | Ht 69.0 in | Wt 182.4 lb

## 2019-10-01 DIAGNOSIS — M0579 Rheumatoid arthritis with rheumatoid factor of multiple sites without organ or systems involvement: Secondary | ICD-10-CM | POA: Diagnosis not present

## 2019-10-01 DIAGNOSIS — J4550 Severe persistent asthma, uncomplicated: Secondary | ICD-10-CM

## 2019-10-01 NOTE — Assessment & Plan Note (Addendum)
Discussion: After discussion with patient today as well as evaluation of right upper extremity I believe it is reasonable to not consider this a injection site reaction.  I do believe that with patient's muscle atrophy that likely an injection site may have caused trauma or soreness to the muscle.  Patient had previously received injections in his hip.  He stopped doing this during the COVID-19 pandemic.  He is interested in resuming hip injections in the future.  We likely will need to consider doing hip injections in the biologic lab if able to coordinate or in an exam room where patient could lay down on exam table comfortably to receive the injections.  Plan: Continue Xolair injections I will follow-up with the biologic team to ensure that you can resume hip injections as well as you maintain your Xolair injection schedule I will follow-up with clinical pharmacy team to ensure there are no abnormal reactions to Xolair that I may be unaware of At next injection patient will need to be monitored in our office for 30 minutes to an hour to ensure no repeat injection site irritation  We will follow-up with Dr. Halford Chessman to discuss your case but I believe for right now it is reasonable to continue Xolair

## 2019-10-01 NOTE — Patient Instructions (Addendum)
You were seen today by Lauraine Rinne, NP  for:   1. Severe persistent extrinsic asthma without complication  I believe that you can continue receiving Xolair as previously scheduled.  We will need to work with our Biologics team to see how we can best provide Xolair to you.  This may be in an exam room in the future so you can lay down.  I will discuss with our Biologics team with your future injections  Please remain on your current Xolair injection schedule  I will recommend that you be monitored for 30 minutes to an hour after your next Xolair injections that way we can keep an eye on you as you have had these symptoms previously occur.  I hope that the symptoms will improve as we are not going to give you another injection in your right upper arm at this time.    Continue all other respiratory medications as managed  Continue follow-up with primary care  Continue follow-up with rheumatology Continue Plaquenil Continue methotrexate   Follow Up:    Return in about 6 weeks (around 11/12/2019), or if symptoms worsen or fail to improve, for Follow up with Dr. Halford Chessman.   Please do your part to reduce the spread of COVID-19:      Reduce your risk of any infection  and COVID19 by using the similar precautions used for avoiding the common cold or flu:  Marland Kitchen Wash your hands often with soap and warm water for at least 20 seconds.  If soap and water are not readily available, use an alcohol-based hand sanitizer with at least 60% alcohol.  . If coughing or sneezing, cover your mouth and nose by coughing or sneezing into the elbow areas of your shirt or coat, into a tissue or into your sleeve (not your hands). Langley Gauss A MASK when in public  . Avoid shaking hands with others and consider head nods or verbal greetings only. . Avoid touching your eyes, nose, or mouth with unwashed hands.  . Avoid close contact with people who are sick. . Avoid places or events with large numbers of people in  one location, like concerts or sporting events. . If you have some symptoms but not all symptoms, continue to monitor at home and seek medical attention if your symptoms worsen. . If you are having a medical emergency, call 911.   [place] / e-Visit: [email or web address]         [facility]: [facility]: [id number]                   [facility]: [id number]     It is flu season:   >>> Best ways to protect herself from the flu: Receive the yearly flu vaccine, practice good hand hygiene washing with soap and also using hand sanitizer when available, eat a nutritious meals, get adequate rest, hydrate appropriately   Please contact the office if your symptoms worsen or you have concerns that you are not improving.   Thank you for choosing [facility] for your healthcare, and for allowing [place] to partner with you on your healthcare journey. I am thankful to be able to provide care to you today.   Wyn Quaker FNP-C

## 2019-10-01 NOTE — Assessment & Plan Note (Signed)
Plan: Continue methotrexate Continue Plaquenil Continue follow-up with primary care and/or rheumatology

## 2019-10-02 NOTE — Progress Notes (Signed)
Reviewed and agree with assessment/plan.   Nathanyal Ashmead, MD [facility] 11/23/2016, 12:24 PM Pager:  [phone]  

## 2019-10-03 DIAGNOSIS — C641 Malignant neoplasm of right kidney, except renal pelvis: Secondary | ICD-10-CM | POA: Diagnosis not present

## 2019-10-03 DIAGNOSIS — M0579 Rheumatoid arthritis with rheumatoid factor of multiple sites without organ or systems involvement: Secondary | ICD-10-CM | POA: Diagnosis not present

## 2019-10-03 DIAGNOSIS — Z79899 Other long term (current) drug therapy: Secondary | ICD-10-CM | POA: Diagnosis not present

## 2019-10-07 ENCOUNTER — Telehealth: Payer: Self-pay | Admitting: Pulmonary Disease

## 2019-10-08 NOTE — Telephone Encounter (Signed)
Pt has been scheduled for his Xolair injection on 10/15/2019 at 1500. I have left a detailed message for the pt making him aware of this. Nothing further was needed.

## 2019-10-10 DIAGNOSIS — M0579 Rheumatoid arthritis with rheumatoid factor of multiple sites without organ or systems involvement: Secondary | ICD-10-CM | POA: Diagnosis not present

## 2019-10-15 ENCOUNTER — Other Ambulatory Visit: Payer: Self-pay

## 2019-10-15 ENCOUNTER — Ambulatory Visit (INDEPENDENT_AMBULATORY_CARE_PROVIDER_SITE_OTHER): Payer: Medicare PPO

## 2019-10-15 DIAGNOSIS — J4550 Severe persistent asthma, uncomplicated: Secondary | ICD-10-CM | POA: Diagnosis not present

## 2019-10-15 MED ORDER — OMALIZUMAB 150 MG/ML ~~LOC~~ SOSY
300.0000 mg | PREFILLED_SYRINGE | Freq: Once | SUBCUTANEOUS | Status: AC
  Administered 2019-10-15: 300 mg via SUBCUTANEOUS

## 2019-10-15 MED ORDER — OMALIZUMAB 75 MG/0.5ML ~~LOC~~ SOSY
75.0000 mg | PREFILLED_SYRINGE | Freq: Once | SUBCUTANEOUS | Status: AC
  Administered 2019-10-15: 75 mg via SUBCUTANEOUS

## 2019-10-15 NOTE — Progress Notes (Signed)
Have you been hospitalized within the last 10 days?  No Do you have a fever?  No Do you have a cough?  No Do you have a headache or sore throat? No Do you have your Epi Pen visible and is it within date?  Yes 

## 2019-10-17 ENCOUNTER — Other Ambulatory Visit: Payer: Self-pay

## 2019-10-17 ENCOUNTER — Ambulatory Visit (INDEPENDENT_AMBULATORY_CARE_PROVIDER_SITE_OTHER): Payer: Medicare PPO

## 2019-10-17 DIAGNOSIS — M069 Rheumatoid arthritis, unspecified: Secondary | ICD-10-CM

## 2019-10-17 DIAGNOSIS — N319 Neuromuscular dysfunction of bladder, unspecified: Secondary | ICD-10-CM

## 2019-10-17 NOTE — Progress Notes (Signed)
Cath Change/ Replacement  Patient is present today for a catheter change due to urinary retention.  51ml of water was removed from the balloon, a 16FR foley cath was removed with out difficulty.  Patient was cleaned and prepped in a sterile fashion with betadine and 2% lidocaine jelly was instilled into the urethra. A 16 FR foley cath was replaced into the bladder no complications were noted Urine return was noted 33ml and urine was yellow in color. The balloon was filled with 106ml of sterile water. A leg bag was attached for drainage.  A night bag was also given to the patient and patient was given instruction on how to change from one bag to another. Patient was given proper instruction on catheter care.    Performed by: Fonnie Jarvis, CMA  Follow up: 1 month

## 2019-10-22 ENCOUNTER — Telehealth: Payer: Self-pay

## 2019-10-22 NOTE — Telephone Encounter (Signed)
Xolair Prefilled Syringe Order: 150mg  Prefilled Syringe:  #4 75mg  Prefilled Syringe: #2 Ordered Date: 10/22/19  Expected date of arrival: 10/23/19  Ordered by: Len Blalock, [place]: Nigel Mormon

## 2019-10-23 NOTE — Telephone Encounter (Signed)
Xolair Prefilled Syringe Received:  150mg  Prefilled Syringe >> quantity #4  lot # [id number]  exp date 05/28/2020 75mg  Prefilled Syringe >> quantity #2  lot # [id number]  exp date 05/28/2020 Medication arrival date: 10/23/19  Received by: Len Blalock, CMA

## 2019-10-28 ENCOUNTER — Other Ambulatory Visit: Payer: Self-pay

## 2019-10-28 MED ORDER — APIXABAN 5 MG PO TABS: 5.0000 mg | ORAL_TABLET | Freq: Two times a day (BID) | ORAL | 6 refills | Status: DC

## 2019-10-30 ENCOUNTER — Other Ambulatory Visit: Payer: Self-pay

## 2019-10-30 ENCOUNTER — Ambulatory Visit (INDEPENDENT_AMBULATORY_CARE_PROVIDER_SITE_OTHER): Payer: Medicare PPO

## 2019-10-30 DIAGNOSIS — J4550 Severe persistent asthma, uncomplicated: Secondary | ICD-10-CM | POA: Diagnosis not present

## 2019-10-30 MED ORDER — OMALIZUMAB 75 MG/0.5ML ~~LOC~~ SOSY
75.0000 mg | PREFILLED_SYRINGE | Freq: Once | SUBCUTANEOUS | Status: AC
  Administered 2019-10-30: 75 mg via SUBCUTANEOUS

## 2019-10-30 MED ORDER — OMALIZUMAB 150 MG/ML ~~LOC~~ SOSY
300.0000 mg | PREFILLED_SYRINGE | Freq: Once | SUBCUTANEOUS | Status: AC
  Administered 2019-10-30: 300 mg via SUBCUTANEOUS

## 2019-10-30 NOTE — Progress Notes (Signed)
Have you been hospitalized within the last 10 days?  No Do you have a fever?  No Do you have a cough?  No Do you have a headache or sore throat? No Do you have your Epi Pen visible and is it within date?  No 

## 2019-10-31 ENCOUNTER — Other Ambulatory Visit: Payer: Self-pay | Admitting: Internal Medicine

## 2019-10-31 DIAGNOSIS — N182 Chronic kidney disease, stage 2 (mild): Secondary | ICD-10-CM | POA: Diagnosis not present

## 2019-10-31 DIAGNOSIS — E871 Hypo-osmolality and hyponatremia: Secondary | ICD-10-CM | POA: Diagnosis not present

## 2019-11-01 NOTE — Telephone Encounter (Signed)
Last filled 09-20-19 #90 Last OV 07-11-19 Next OV 11-11-19 [facility] [address] [employer]

## 2019-11-11 ENCOUNTER — Other Ambulatory Visit: Payer: Self-pay

## 2019-11-11 ENCOUNTER — Ambulatory Visit (INDEPENDENT_AMBULATORY_CARE_PROVIDER_SITE_OTHER): Payer: Medicare PPO | Admitting: Internal Medicine

## 2019-11-11 ENCOUNTER — Encounter: Payer: Self-pay | Admitting: Internal Medicine

## 2019-11-11 DIAGNOSIS — M05731 Rheumatoid arthritis with rheumatoid factor of right wrist without organ or systems involvement: Secondary | ICD-10-CM | POA: Diagnosis not present

## 2019-11-11 DIAGNOSIS — I10 Essential (primary) hypertension: Secondary | ICD-10-CM | POA: Diagnosis not present

## 2019-11-11 DIAGNOSIS — J45909 Unspecified asthma, uncomplicated: Secondary | ICD-10-CM | POA: Diagnosis not present

## 2019-11-11 DIAGNOSIS — I480 Paroxysmal atrial fibrillation: Secondary | ICD-10-CM

## 2019-11-11 DIAGNOSIS — F39 Unspecified mood [affective] disorder: Secondary | ICD-10-CM

## 2019-11-11 DIAGNOSIS — M05732 Rheumatoid arthritis with rheumatoid factor of left wrist without organ or systems involvement: Secondary | ICD-10-CM | POA: Diagnosis not present

## 2019-11-11 DIAGNOSIS — E222 Syndrome of inappropriate secretion of antidiuretic hormone: Secondary | ICD-10-CM

## 2019-11-11 DIAGNOSIS — Z0000 Encounter for general adult medical examination without abnormal findings: Secondary | ICD-10-CM

## 2019-11-11 DIAGNOSIS — Z7189 Other specified counseling: Secondary | ICD-10-CM | POA: Diagnosis not present

## 2019-11-11 NOTE — Assessment & Plan Note (Signed)
Sodium recent recently Keeps up with Dr Holley Raring

## 2019-11-11 NOTE — Assessment & Plan Note (Signed)
Regular now No apparent symptoms On the eliquis

## 2019-11-11 NOTE — Assessment & Plan Note (Signed)
Doing well with the [employer]

## 2019-11-11 NOTE — Progress Notes (Signed)
Subjective:    Patient ID: [name], male    DOB: 15-Nov-1934, 85 y.o.   MRN: [id number]  HPI Here for Medicare wellness visit and follow up of chronic health conditions With daughter  This visit occurred during the SARS-CoV-2 public health emergency.  Safety protocols were in place, including screening questions prior to the visit, additional usage of staff PPE, and extensive cleaning of exam room while observing appropriate contact time as indicated for disinfecting solutions.   Reviewed form and advanced directives Reviewed other doctors No tobacco Has a drink a few times a week Trying to exercise more regularly Vision is okay---some brief "visions" as below. Checked by eye doctor Hearing aides rechecked recently--they are working fine No falls Walks with walker. Independent with bathroom. Help with dressing and bathing. Avoids steps (other than in and out of house). Doesn't drive No apparent memory issues that are concerning  Bad problems with his arthritis More pain in left knee--?OA (had TKR) Hand pain got bad, especially in wrists. Dr Jefm Bryant gave a month of prednisone and this has helped (just ended) MTX now by injection (subcutaneous)  Leakage around his foley He relates this to spasms Still goes monthly to have it changed at the urologist's office Healing well from the nephrectomy  Recent visit with [place] Visit 2 weeks ago---sodium normal and GFR normal also  Still has anxiety--various issues Now worries about bladder cancer due to chronic catheter Xanax will help with this--family try to limit him Does use 2 at bedtime for sleep Will get episodic depressed mood--gets down about arthritis pain  No chest pain  No SOB No syncope. Occasional AM dizziness--first getting out of bed No edema in general now No palpitations  Current Outpatient Medications on File Prior to Visit  Medication Sig Dispense Refill  . acetaminophen (TYLENOL) 500 MG tablet Take 500  mg by mouth every 8 (eight) hours as needed for moderate pain or headache.     . albuterol (PROVENTIL HFA;VENTOLIN HFA) 108 (90 Base) MCG/ACT inhaler Inhale 2 puffs into the lungs every 6 (six) hours as needed for wheezing or shortness of breath. 1 Inhaler 3  . ALPRAZolam (XANAX) 0.5 MG tablet TAKE 1 TABLET BY MOUTH THREE TIMES DAILY AS NEEDED FOR ANXIETY OR SLEEP 90 tablet 0  . apixaban (ELIQUIS) 5 MG TABS tablet Take 1 tablet (5 mg total) by mouth 2 (two) times daily. 60 tablet 6  . Ascorbic Acid (VITAMIN C PO) Take by mouth.    . Cholecalciferol (VITAMIN D3) 5000 UNITS CAPS Take 5,000 Units by mouth daily.     . Coenzyme Q10 (CO Q 10) 100 MG CAPS Take 100 mg by mouth daily.     . fluticasone (FLONASE) 50 MCG/ACT nasal spray Place 2 sprays into both nostrils 2 (two) times daily. 48 g 3  . folic acid (FOLVITE) A999333 MCG tablet Take 1,200 mcg by mouth daily.    . furosemide (LASIX) 20 MG tablet Take 1 tablet (20 mg total) by mouth daily.    Marland Kitchen gabapentin (NEURONTIN) 300 MG capsule TAKE 1 CAPSULE BY MOUTH THREE TIMES DAILY (Patient taking differently: Take 300 mg by mouth 3 (three) times daily. ) 90 capsule 11  . hydroxychloroquine (PLAQUENIL) 200 MG tablet Take 200 mg by mouth 2 (two) times daily.     . methotrexate 250 MG/10ML injection Inject 0.8 mLs into the skin once a week.    . Multiple Vitamin (MULTIVITAMIN) tablet Take 1 tablet by mouth daily.      Marland Kitchen  MYRBETRIQ 50 MG TB24 tablet TAKE 1 TABLET(50 MG) BY MOUTH DAILY 30 tablet 3  . polyethylene glycol powder (GLYCOLAX/MIRALAX) 17 GM/SCOOP powder Take 17 g by mouth at bedtime.     . pravastatin (PRAVACHOL) 20 MG tablet Take 1 tablet (20 mg total) by mouth at bedtime. 90 tablet 3  . predniSONE (DELTASONE) 5 MG tablet     . Probiotic Product (ALIGN) 4 MG CAPS Take 1 capsule (4 mg total) by mouth daily. 15 capsule 0  . Selenium 200 MCG CAPS Take 200 mcg by mouth daily.     . sodium chloride 1 g tablet Take 1 tablet (1 g total) by mouth 2 (two) times  daily with a meal. (Patient taking differently: Take 1 g by mouth 3 (three) times daily with meals. ) 60 tablet 1  . Turmeric 500 MG CAPS Take 500 mg by mouth daily.     . Zinc 50 MG TABS Take by mouth.    . metoprolol tartrate (LOPRESSOR) 25 MG tablet Take 0.5 tablets (12.5 mg total) by mouth 2 (two) times daily. 180 tablet 3   Current Facility-Administered Medications on File Prior to Visit  Medication Dose Route Frequency Provider Last Rate Last Admin  . omalizumab Arvid Right) prefilled syringe 300 mg  300 mg Subcutaneous Once Chesley Mires, MD      . omalizumab Arvid Right) prefilled syringe 75 mg  75 mg Subcutaneous Once Chesley Mires, MD        Allergies  Allergen Reactions  . Ciprofloxacin Nausea And Vomiting    Headache  . Citalopram Hydrobromide Other (See Comments)    "unknown"  . Clindamycin Nausea And Vomiting  . Lorazepam Other (See Comments)    Adverse reaction  . Paroxetine Nausea Only  . Ramipril Other (See Comments)    "unknown"  . Simvastatin   . Sulfa Antibiotics Other (See Comments)    Past Medical History:  Diagnosis Date  . (HFpEF) heart failure with preserved ejection fraction ([facility])    a. 02/2019 Echo: Ef 60-65%, mildly reduced RV fxn. RVSP 62mmHg. Mildly dil LA. Mod dil RA. Mild to mod TR. Mild to mod AS. Triv AI.  Marland Kitchen Allergy   . Anemia   . Anxiety   . Arthritis    rheumatoid  . Asthma   . Basal cell carcinoma    back  . CAD (coronary artery disease)   . CHF (congestive heart failure) ([place])   . Chronic kidney disease    Mass right kidney  . Chronic sinusitis   . Collagen vascular disease ([facility])    Rheumatoid Arthritis  . Depression   . Diverticulitis   . Dysrhythmia   . ED (erectile dysfunction)   . GERD (gastroesophageal reflux disease)   . History of SIADH   . Hx of adenomatous colonic polyps   . Hypertension   . Hyponatremia   . IBS (irritable bowel syndrome)   . Neurodermatitis   . Neurogenic bladder   . OSA (obstructive sleep apnea)    no  CPAP since weight loss  . Persistent atrial fibrillation ([place])    a. Dx 02/2019; b. CHA2DS2VASc = 6-->eliquis initiated.  . Prostate cancer ([place])   . Renal mass    10/2018 4.2cm R renal cortical mass. Most compatible w/ clear cell renal cell carcinoma.    Past Surgical History:  Procedure Laterality Date  . CARDIOVERSION N/A 04/23/2019   Procedure: CARDIOVERSION;  Surgeon: Nelva Bush, MD;  Location: [facility] ORS;  Service: Cardiovascular;  Laterality: N/A;  .  KNEE ARTHROPLASTY Right 09/02/2015   Procedure: COMPUTER ASSISTED TOTAL KNEE ARTHROPLASTY;  Surgeon: [name], MD;  Location: [facility] ORS;  Service: Orthopedics;  Laterality: Right;  . LAPAROSCOPIC NEPHRECTOMY, HAND ASSISTED Right 06/17/2019   Procedure: HAND ASSISTED LAPAROSCOPIC NEPHRECTOMY;  Surgeon: Hollice Espy, MD;  Location: [facility] ORS;  Service: Urology;  Laterality: Right;  . NASAL SINUS SURGERY  2009   DEVIATED SEPTUM AND POLYPS  . permanent indwelling catheter    . PROSTATE SURGERY     PROSTATECTOMY  . TOTAL KNEE ARTHROPLASTY Left 9/15   Dr [name]  . URETHRAL STRICTURE DILATATION  02-2010   Dr.Cope    Family History  Problem Relation Age of Onset  . Heart disease Mother 82       heart failure  . Pneumonia Father 4       pnemonia  . Skin cancer Father   . Skin cancer Sister   . Skin cancer Son   . Colon cancer Neg Hx   . Esophageal cancer Neg Hx   . Stomach cancer Neg Hx   . Pancreatic cancer Neg Hx   . Liver disease Neg Hx     Social History   Socioeconomic History  . Marital status: Married    Spouse name: Not on file  . Number of children: 2  . Years of education: Not on file  . Highest education level: Not on file  Occupational History  . Occupation: Radio Licensed conveyancer    Comment: retired  Tobacco Use  . Smoking status: Never Smoker  . Smokeless tobacco: Never Used  Substance and Sexual Activity  . Alcohol use: Yes    Comment: 5 days out of 7 drinks gin   . Drug use: No  . Sexual  activity: Not Currently  Other Topics Concern  . Not on file  Social History Narrative   Not sure about a living will or health care POA   Wife should make health care decisions for him--then daughter, then son   Would accept resuscitation attempts   Not sure about tube feeds   Social Determinants of Health   Financial Resource Strain:   . Difficulty of Paying Living Expenses: Not on file  Food Insecurity:   . Worried About Charity fundraiser in the Last Year: Not on file  . Ran Out of Food in the Last Year: Not on file  Transportation Needs:   . Lack of Transportation (Medical): Not on file  . Lack of Transportation (Non-Medical): Not on file  Physical Activity:   . Days of Exercise per Week: Not on file  . Minutes of Exercise per Session: Not on file  Stress:   . Feeling of Stress : Not on file  Social Connections:   . Frequency of Communication with Friends and Family: Not on file  . Frequency of Social Gatherings with Friends and Family: Not on file  . Attends Religious Services: Not on file  . Active Member of Clubs or Organizations: Not on file  . Attends Archivist Meetings: Not on file  . Marital Status: Not on file  Intimate Partner Violence:   . Fear of Current or Ex-Partner: Not on file  . Emotionally Abused: Not on file  . Physically Abused: Not on file  . Sexually Abused: Not on file   Review of Systems Appetite is fair Weight back up a bit Having some intermittent "visions"---like a sore on his wife's arm in bed (that isn't there) and it may  last 2 seconds. Or he will see falling leaves--just a few seconds Sleeping fairly well Teeth are okay--due for the dentist. May reconsider 2 implants No current skin problems--keeps up with derm No heartburn or dysphagia (other than big vitamins) Bowels are okay with miralax     Objective:   Physical Exam  Constitutional: He is oriented to person, place, and time. He appears well-developed. No distress.    HENT:  Mouth/Throat: Oropharynx is clear and moist. No oropharyngeal exudate.  Neck: No thyromegaly present.  Cardiovascular: Normal rate, regular rhythm, normal heart sounds and intact distal pulses. Exam reveals no gallop.  No murmur heard. Respiratory: Effort normal. No respiratory distress. He has no wheezes. He has no rales.  Decreased breath sounds but clear  GI: Soft. There is no abdominal tenderness.  Musculoskeletal:     Comments: Trace to 1+ edema in feet/calves  Lymphadenopathy:    He has no cervical adenopathy.  Neurological: He is alert and oriented to person, place, and time.  President--- "Megan Salon, Obama, Bush" [id number] Recall 3/3   Skin:  Feet purplish and cold--but good pulses  Psychiatric: He has a normal mood and affect. His behavior is normal.           Assessment & Plan:

## 2019-11-11 NOTE — Assessment & Plan Note (Signed)
Still anxious about cancer--chronic Some dysthymia Using the xanax only---mostly at night

## 2019-11-11 NOTE — Assessment & Plan Note (Signed)
I have personally reviewed the Medicare Annual Wellness questionnaire and have noted 1. The patient's medical and social history 2. Their use of alcohol, tobacco or illicit drugs 3. Their current medications and supplements 4. The patient's functional ability including ADL's, fall risks, home safety risks and hearing or visual             impairment. 5. Diet and physical activities 6. Evidence for depression or mood disorders  The patients weight, height, BMI and visual acuity have been recorded in the chart I have made referrals, counseling and provided education to the patient based review of the above and I have provided the pt with a written personalized care plan for preventive services.  I have provided you with a copy of your personalized plan for preventive services. Please take the time to review along with your updated medication list.  Had flu vaccine Done with cancer screening Td at pharmacy or if he has injury (here) Will consider shingrix Working on exercise

## 2019-11-11 NOTE — Assessment & Plan Note (Signed)
Recent exacerbation requiring prednisone Better now Still on MTX (injection now) and hydroxychloroquine

## 2019-11-11 NOTE — Assessment & Plan Note (Signed)
BP Readings from Last 3 Encounters:  11/11/19 118/80  10/01/19 118/64  08/09/19 (!) 116/54   Good control

## 2019-11-12 ENCOUNTER — Ambulatory Visit: Payer: Medicare PPO | Admitting: Physician Assistant

## 2019-11-12 DIAGNOSIS — M25512 Pain in left shoulder: Secondary | ICD-10-CM | POA: Diagnosis not present

## 2019-11-12 DIAGNOSIS — C641 Malignant neoplasm of right kidney, except renal pelvis: Secondary | ICD-10-CM | POA: Diagnosis not present

## 2019-11-12 DIAGNOSIS — M0579 Rheumatoid arthritis with rheumatoid factor of multiple sites without organ or systems involvement: Secondary | ICD-10-CM | POA: Diagnosis not present

## 2019-11-12 DIAGNOSIS — M25511 Pain in right shoulder: Secondary | ICD-10-CM | POA: Diagnosis not present

## 2019-11-12 DIAGNOSIS — Z79899 Other long term (current) drug therapy: Secondary | ICD-10-CM | POA: Diagnosis not present

## 2019-11-12 DIAGNOSIS — G8929 Other chronic pain: Secondary | ICD-10-CM | POA: Diagnosis not present

## 2019-11-14 ENCOUNTER — Other Ambulatory Visit: Payer: Self-pay

## 2019-11-14 ENCOUNTER — Ambulatory Visit (INDEPENDENT_AMBULATORY_CARE_PROVIDER_SITE_OTHER): Payer: Medicare PPO

## 2019-11-14 ENCOUNTER — Ambulatory Visit: Payer: Medicare PPO | Admitting: Pulmonary Disease

## 2019-11-14 ENCOUNTER — Encounter: Payer: Self-pay | Admitting: Pulmonary Disease

## 2019-11-14 VITALS — BP 108/60 | HR 64 | Temp 97.9°F

## 2019-11-14 DIAGNOSIS — J4550 Severe persistent asthma, uncomplicated: Secondary | ICD-10-CM | POA: Diagnosis not present

## 2019-11-14 DIAGNOSIS — Z7189 Other specified counseling: Secondary | ICD-10-CM

## 2019-11-14 MED ORDER — OMALIZUMAB 150 MG/ML ~~LOC~~ SOSY
300.0000 mg | PREFILLED_SYRINGE | Freq: Once | SUBCUTANEOUS | Status: AC
  Administered 2019-11-14: 15:00:00 300 mg via SUBCUTANEOUS

## 2019-11-14 MED ORDER — OMALIZUMAB 75 MG/0.5ML ~~LOC~~ SOSY
75.0000 mg | PREFILLED_SYRINGE | Freq: Once | SUBCUTANEOUS | Status: AC
  Administered 2019-11-14: 15:00:00 75 mg via SUBCUTANEOUS

## 2019-11-14 NOTE — Progress Notes (Signed)
All questions were answered by the patient before medication was administered. Have you been hospitalized in the last 10 days? No Do you have a fever? No Do you have a cough? No Do you have a headache or sore throat? No  

## 2019-11-14 NOTE — Progress Notes (Addendum)
[name], Critical Care, and Sleep Medicine  Chief Complaint  Patient presents with  . Follow-up    Patient is here for ROV. Patient states that he feels good overall.     Constitutional:  BP 108/60 (BP Location: Right Arm, Patient Position: Sitting, Cuff Size: Normal)   Pulse 64   Temp 97.9 F (36.6 C) (Temporal)   SpO2 98% Comment: on RA  Past Medical History:  Rheumatoid arthritis, OA, Neurogenic bladder, IBS, Hyponatremia, HTN, Colon polyps, GERD, ED, Diverticulitis, Depression, Prostate cancer, CAD, Anxiety, A fib  Brief Summary:  [name] is a 85 y.o. male with allergic asthma and rhinitis.  He had Rt nephrectomy in July 2020.  Recovered well from this.  Renal fx and Na have been stable.  Had A fib.  Had cardioversion.  Has remained in NSR.  Tolerating xolair injection better since injections have been switched to his hip.  Saw rheumatology recently.  Had more swelling in wrists.  Started on prednisone.  Not having cough, wheeze, or chest congestion.  Has been getting post nasal drip and tickle in his throat in the morning.  Hasn't been using sinus rinse lately.  Physical Exam:   Appearance - well kempt   ENMT - no sinus tenderness, no nasal discharge, no oral exudate  Neck - no masses, trachea midline, no thyromegaly, no elevation in JVP  Respiratory - normal appearance of chest wall, normal respiratory effort w/o accessory muscle use, no dullness on percussion, no wheezing or rales  CV - s1s2 regular rate and rhythm, 2 murmur, no peripheral edema, radial pulses symmetric  GI - soft, non tender  Lymph - no adenopathy noted in neck and axillary areas  MSK - uses a walker  Ext - no cyanosis, clubbing, or joint inflammation noted  Skin - no rashes, lesions, or ulcers  Neuro - normal strength, oriented x 3  Psych - normal mood and affect   Assessment/Plan:   Allergic asthma and rhinitis. - will controled with xolair >> he gets injection in  his hips - resume nasal irrigation - prn flonase, albuterol  Right renal mass. - had Rt nephrectomy July 2020  Persistent atrial fibrillation, chronic diastolic CHF, aortic stenosis. - remains on eliquis, lasix, lopressor - followed by Dr. Fletcher Anon with cardiology  Rheumatoid arthritis. - positive RF, anti CCP - recently restarted on prednisone >> tentative plan to wean off after Christmas holiday - remains on MTX, plaquenil - followed by Dr. Jefm Bryant with rheumatology  Advice about COVID 19. - discussed pros/cons regarding COVID 19 vaccines, and know side effect profiles   Patient Instructions  Follow up in 6 months   A total of  25 minutes were spent face to face with the patient and more than half of that time involved counseling or coordination of care.   Chesley Mires, MD [name]/Critical Care Pager: [phone] 11/14/2019, 3:43 PM  Flow Sheet     [name] tests:  PFT 04/07/10 >> FEV1 2.61 (86%), FEV1% 73, TLC 7.26 (85%), TLC 112%  Sleep tests:  CT chest 03/15/17 > atherosclerosis, 3.4 cm Lt lung base density rounded ATX, granuloma in lingula, 3 mm RLL nodule, 4 mm RLL nodule CT chest 09/18/17 >>atherosclerosis, rounded ATX LLL, stable 4 mm RLL nodule, calcified granuloma in lingula, stable 4 mm RLL nodule  Cardiac tests:  Echo 03/27/19 >> EF 60 to 65%, RVSP 45 mmHg, mild/mod AS  Medications:   Allergies as of 11/14/2019      Reactions  Ciprofloxacin Nausea And Vomiting   Headache   Citalopram Hydrobromide Other (See Comments)   "unknown"   Clindamycin Nausea And Vomiting   Lorazepam Other (See Comments)   Adverse reaction   Paroxetine Nausea Only   Ramipril Other (See Comments)   "unknown"   Simvastatin    Sulfa Antibiotics Other (See Comments)      Medication List       Accurate as of November 14, 2019  3:43 PM. If you have any questions, ask your nurse or doctor.        acetaminophen 500 MG tablet Commonly known as: TYLENOL Take  500 mg by mouth every 8 (eight) hours as needed for moderate pain or headache.   albuterol 108 (90 Base) MCG/ACT inhaler Commonly known as: VENTOLIN HFA Inhale 2 puffs into the lungs every 6 (six) hours as needed for wheezing or shortness of breath.   Align 4 MG Caps Take 1 capsule (4 mg total) by mouth daily.   ALPRAZolam 0.5 MG tablet Commonly known as: XANAX TAKE 1 TABLET BY MOUTH THREE TIMES DAILY AS NEEDED FOR ANXIETY OR SLEEP   apixaban 5 MG Tabs tablet Commonly known as: Eliquis Take 1 tablet (5 mg total) by mouth 2 (two) times daily.   Co Q 10 100 MG Caps Take 100 mg by mouth daily.   fluticasone 50 MCG/ACT nasal spray Commonly known as: FLONASE Place 2 sprays into both nostrils 2 (two) times daily.   folic acid A999333 MCG tablet Commonly known as: FOLVITE Take 1,200 mcg by mouth daily.   furosemide 20 MG tablet Commonly known as: LASIX Take 1 tablet (20 mg total) by mouth daily.   gabapentin 300 MG capsule Commonly known as: NEURONTIN TAKE 1 CAPSULE BY MOUTH THREE TIMES DAILY   hydroxychloroquine 200 MG tablet Commonly known as: PLAQUENIL Take 200 mg by mouth 2 (two) times daily.   methotrexate 250 MG/10ML injection Inject 0.8 mLs into the skin once a week.   metoprolol tartrate 25 MG tablet Commonly known as: LOPRESSOR Take 0.5 tablets (12.5 mg total) by mouth 2 (two) times daily.   multivitamin tablet Take 1 tablet by mouth daily.   Myrbetriq 50 MG Tb24 tablet Generic drug: mirabegron ER TAKE 1 TABLET(50 MG) BY MOUTH DAILY   polyethylene glycol powder 17 GM/SCOOP powder Commonly known as: GLYCOLAX/MIRALAX Take 17 g by mouth at bedtime.   pravastatin 20 MG tablet Commonly known as: PRAVACHOL Take 1 tablet (20 mg total) by mouth at bedtime.   predniSONE 5 MG tablet Commonly known as: DELTASONE   Selenium 200 MCG Caps Take 200 mcg by mouth daily.   sodium chloride 1 g tablet Take 1 tablet (1 g total) by mouth 2 (two) times daily with a  meal. What changed: when to take this   Turmeric 500 MG Caps Take 500 mg by mouth daily.   VITAMIN C PO Take by mouth.   Vitamin D3 125 MCG (5000 UT) Caps Take 5,000 Units by mouth daily.   Zinc 50 MG Tabs Take by mouth.       Past Surgical History:  He  has a past surgical history that includes Prostate surgery; Nasal sinus surgery (2009); Urethra dilation [id number]); Total knee arthroplasty (Left, 9/15); Knee Arthroplasty (Right, 09/02/2015); permanent indwelling catheter; Cardioversion (N/A, 04/23/2019); and Laparoscopic nephrectomy, hand assisted (Right, 06/17/2019).  Family History:  His family history includes Heart disease (age of onset: 20) in his mother; Pneumonia (age of onset: 5) in his father; Skin cancer  in his father, sister, and son.  Social History:  He  reports that he has never smoked. He has never used smokeless tobacco. He reports current alcohol use. He reports that he does not use drugs.

## 2019-11-14 NOTE — Patient Instructions (Signed)
Follow up in 6 months 

## 2019-11-15 ENCOUNTER — Ambulatory Visit (INDEPENDENT_AMBULATORY_CARE_PROVIDER_SITE_OTHER): Payer: Medicare PPO | Admitting: Physician Assistant

## 2019-11-15 ENCOUNTER — Encounter: Payer: Self-pay | Admitting: Physician Assistant

## 2019-11-15 VITALS — BP 124/67 | HR 64 | Ht 69.0 in | Wt 185.0 lb

## 2019-11-15 DIAGNOSIS — N319 Neuromuscular dysfunction of bladder, unspecified: Secondary | ICD-10-CM

## 2019-11-15 NOTE — Progress Notes (Signed)
Cath Change/ Replacement  Patient is present today for a catheter change due to urinary retention.  66ml of water was removed from the balloon, a 16FR foley cath was removed with out difficulty.  Patient was cleaned and prepped in a sterile fashion with betadine and 2% lidocaine jelly was instilled into the urethra. A 16 FR foley cath was replaced into the bladder no complications were noted Urine return was noted 73ml and urine was yellow in color. The balloon was filled with 83ml of sterile water and attached to his leg bag was attached for drainage.  Patient was given proper instruction on catheter care.    Performed by: Verlene Mayer, CMA  Follow up: 4 weeks

## 2019-11-25 ENCOUNTER — Telehealth: Payer: Self-pay | Admitting: Pulmonary Disease

## 2019-11-25 NOTE — Telephone Encounter (Signed)
Xolair Prefilled Syringe Order: 150mg  Prefilled Syringe:  #4 75mg  Prefilled Syringe: #2 Ordered Date: 11/25/19 Expected date of arrival: 11/26/19 Ordered by: [place]: Nigel Mormon

## 2019-11-26 NOTE — Telephone Encounter (Signed)
Xolair Prefilled Syringe Received:  150mg  Prefilled Syringe >> quantity #4, lot # [id number], exp date 06/2020 75mg  Prefilled Syringe >> quantity #2, lot # [id number], exp date 06/2020 Medication arrival date: 11/26/2019 Received by: Desmond Dike, [place]

## 2019-12-02 ENCOUNTER — Other Ambulatory Visit: Payer: Self-pay

## 2019-12-02 ENCOUNTER — Ambulatory Visit (INDEPENDENT_AMBULATORY_CARE_PROVIDER_SITE_OTHER): Payer: Medicare PPO

## 2019-12-02 DIAGNOSIS — J4550 Severe persistent asthma, uncomplicated: Secondary | ICD-10-CM | POA: Diagnosis not present

## 2019-12-02 MED ORDER — OMALIZUMAB 75 MG/0.5ML ~~LOC~~ SOSY
75.0000 mg | PREFILLED_SYRINGE | Freq: Once | SUBCUTANEOUS | Status: AC
  Administered 2019-12-02: 16:00:00 75 mg via SUBCUTANEOUS

## 2019-12-02 MED ORDER — OMALIZUMAB 150 MG/ML ~~LOC~~ SOSY
300.0000 mg | PREFILLED_SYRINGE | Freq: Once | SUBCUTANEOUS | Status: AC
  Administered 2019-12-02: 300 mg via SUBCUTANEOUS

## 2019-12-02 NOTE — Progress Notes (Signed)
All questions were answered by the patient before medication was administered. Have you been hospitalized in the last 10 days? No Do you have a fever? No Do you have a cough? No Do you have a headache or sore throat? No  

## 2019-12-09 ENCOUNTER — Other Ambulatory Visit: Payer: Self-pay

## 2019-12-09 MED ORDER — ALPRAZOLAM 0.5 MG PO TABS: ORAL_TABLET | ORAL | 0 refills | Status: DC

## 2019-12-09 NOTE — Telephone Encounter (Signed)
Last filled 11-01-19 #90 Last OV 11-11-19 Next OV 05-11-20 [facility] [address] [employer]

## 2019-12-10 ENCOUNTER — Telehealth: Payer: Self-pay | Admitting: Cardiovascular Disease

## 2019-12-10 NOTE — Telephone Encounter (Signed)
Spoke with the patient. Advised the patient that Eliquis does not need to be held for routine dental procedures including dental fillings. Patient verbalized understanding and voiced appreciation for the call back.

## 2019-12-10 NOTE — Telephone Encounter (Signed)
Patient will be having dental fillings by Dr Johnnette Litter soon Wants to discuss if he will need to hold Eliquis Please call to discuss

## 2019-12-17 ENCOUNTER — Ambulatory Visit: Payer: Medicare PPO

## 2019-12-19 ENCOUNTER — Other Ambulatory Visit: Payer: Self-pay

## 2019-12-19 ENCOUNTER — Ambulatory Visit (INDEPENDENT_AMBULATORY_CARE_PROVIDER_SITE_OTHER): Payer: Medicare PPO | Admitting: Physician Assistant

## 2019-12-19 ENCOUNTER — Encounter: Payer: Self-pay | Admitting: Physician Assistant

## 2019-12-19 VITALS — BP 96/72

## 2019-12-19 DIAGNOSIS — N319 Neuromuscular dysfunction of bladder, unspecified: Secondary | ICD-10-CM

## 2019-12-19 NOTE — Progress Notes (Signed)
Cath Change/ Replacement  Patient is present today for a catheter change due to urinary retention.  48ml of water was removed from the balloon, a 16FR foley cath was removed with out difficulty.  Patient was cleaned and prepped in a sterile fashion with betadine and 2% lidocaine jelly was instilled into his urethra. A 16 FR foley cath was replaced into the bladder no complications were noted Urine return was noted 55ml and urine was yellow in color. The balloon was filled with 7ml of sterile water and his catheter was attached to his leg bag for drainage. Patient was given proper instruction on catheter care.    Performed by: Verlene Mayer, CMA  Follow up: I had a lengthy conversation with the patient today.  He reports continued painful bladder spasms associated with leakage around his Foley catheter despite antispasmodic therapy with Myrbetriq 50 mg daily.  Additionally, he reports increased urinary leakage overnight that he does not believe is associated with spasms.  Patient has a history of prostate cancer s/p radical prostatectomy and salvage radiation with a known small capacity bladder.  He is a poor candidate for SP tube placement as well as intravesical Botox.  I recommended that we augment his antispasmodic therapy with trospium at this time.  He states he is interested in this, however he would like to defer it today.  Additionally, he reports concern over his increased risk of bladder cancer development due to his chronic indwelling Foley catheter.  I explained that chronic catheterization does increase his risk for bladder cancer, which is why we recommend cystoscopy every 5 years.  His last cystoscopy with Dr. [name] in October 2019 revealed no evidence of bladder cancer.  I spent 25 min with this patient, of which greater than 50% was spent in counseling and coordination of care with the patient.   Willie Loop, PA-C  12/19/19 4:23 PM

## 2019-12-20 ENCOUNTER — Ambulatory Visit: Payer: Medicare PPO

## 2019-12-20 ENCOUNTER — Ambulatory Visit: Payer: Medicare PPO | Admitting: Physician Assistant

## 2019-12-24 DIAGNOSIS — R609 Edema, unspecified: Secondary | ICD-10-CM | POA: Diagnosis not present

## 2019-12-24 DIAGNOSIS — M0579 Rheumatoid arthritis with rheumatoid factor of multiple sites without organ or systems involvement: Secondary | ICD-10-CM | POA: Diagnosis not present

## 2019-12-24 DIAGNOSIS — C641 Malignant neoplasm of right kidney, except renal pelvis: Secondary | ICD-10-CM | POA: Diagnosis not present

## 2019-12-24 DIAGNOSIS — Z79899 Other long term (current) drug therapy: Secondary | ICD-10-CM | POA: Diagnosis not present

## 2019-12-24 DIAGNOSIS — G8929 Other chronic pain: Secondary | ICD-10-CM | POA: Diagnosis not present

## 2019-12-24 DIAGNOSIS — M25511 Pain in right shoulder: Secondary | ICD-10-CM | POA: Diagnosis not present

## 2020-01-01 NOTE — Progress Notes (Signed)
Office Visit    Patient Name: [name] Date of Encounter: 01/01/2020  Primary Care Provider:  [name], MD Primary Cardiologist:  Kathlyn Sacramento, MD Electrophysiologist:  None   Chief Complaint    [name] is a 86 y.o. male with a hx of atrial fibrillation, rheumatoid arthritis, TIA, HLD, diastolic heart failure, chronic hyponatremia due to suspected SIADH, osteoarthritis presents today for follow up of atrial fibrillation and diastolic heart failure.   Past Medical History    Past Medical History:  Diagnosis Date  . (HFpEF) heart failure with preserved ejection fraction ([place])    a. 02/2019 Echo: Ef 60-65%, mildly reduced RV fxn. RVSP 68mmHg. Mildly dil LA. Mod dil RA. Mild to mod TR. Mild to mod AS. Triv AI.  Marland Kitchen Allergy   . Anemia   . Anxiety   . Arthritis    rheumatoid  . Asthma   . Basal cell carcinoma    back  . CAD (coronary artery disease)   . CHF (congestive heart failure) ([place])   . Chronic kidney disease    Mass right kidney  . Chronic sinusitis   . Collagen vascular disease (HCC)    Rheumatoid Arthritis  . Depression   . Diverticulitis   . Dysrhythmia   . ED (erectile dysfunction)   . GERD (gastroesophageal reflux disease)   . History of SIADH   . Hx of adenomatous colonic polyps   . Hypertension   . Hyponatremia   . IBS (irritable bowel syndrome)   . Neurodermatitis   . Neurogenic bladder   . OSA (obstructive sleep apnea)    no CPAP since weight loss  . Persistent atrial fibrillation ([place])    a. Dx 02/2019; b. CHA2DS2VASc = 6-->eliquis initiated.  . Prostate cancer ([place])   . Renal mass    10/2018 4.2cm R renal cortical mass. Most compatible w/ clear cell renal cell carcinoma.   Past Surgical History:  Procedure Laterality Date  . CARDIOVERSION N/A 04/23/2019   Procedure: CARDIOVERSION;  Surgeon: Nelva Bush, MD;  Location: [facility] ORS;  Service: Cardiovascular;  Laterality: N/A;  . KNEE ARTHROPLASTY Right 09/02/2015   Procedure:  COMPUTER ASSISTED TOTAL KNEE ARTHROPLASTY;  Surgeon: [name], MD;  Location: [facility] ORS;  Service: Orthopedics;  Laterality: Right;  . LAPAROSCOPIC NEPHRECTOMY, HAND ASSISTED Right 06/17/2019   Procedure: HAND ASSISTED LAPAROSCOPIC NEPHRECTOMY;  Surgeon: Hollice Espy, MD;  Location: [facility] ORS;  Service: Urology;  Laterality: Right;  . NASAL SINUS SURGERY  2009   DEVIATED SEPTUM AND POLYPS  . permanent indwelling catheter    . PROSTATE SURGERY     PROSTATECTOMY  . TOTAL KNEE ARTHROPLASTY Left 9/15   Dr [name]  . URETHRAL STRICTURE DILATATION  02-2010   Dr.Cope    Allergies  Allergies  Allergen Reactions  . Ciprofloxacin Nausea And Vomiting    Headache  . Citalopram Hydrobromide Other (See Comments)    "unknown"  . Clindamycin Nausea And Vomiting  . Lorazepam Other (See Comments)    Adverse reaction  . Paroxetine Nausea Only  . Ramipril Other (See Comments)    "unknown"  . Simvastatin   . Sulfa Antibiotics Other (See Comments)    History of Present Illness    [name] is a 86 y.o. male with a hx of atrial fibrillation, diastolic heart failure, rheumatoid arthritis, TIA, HLD, chronic hyponatremia due to suspected SIADH, osteoarthritis. He was last seen 08/09/19 by Dr. Fletcher Anon.  Echo June 2019 with normal LVEF,  mild AS, mild MR. April 2020 noted leg edema and weight gain. Echocardiogram April 2020 with normal LVEF with mild pumonary hypertension. Noted to be in atrial fib. Thought to have diastolic heart failure due to atrial fib. Underwent successful cardioversion 04/23/19. He continues Eliquis. His Metoprolol was decreased to 12.5mg  twice daily due to relative hypotension.   Follows with urology for right renal mass diagnosed in 2019, but growing in size. Nephrectomy initially postponed due to the pandemic. Underwent right hand-assisted laparoscopic nephrectomy in July 2020.   Labs reviewed via Care Everywhere   10/31/19 - Hb 11.5, K 4.6, Ca 8.5, creatinine 0.69, GFR  88  12/24/19 - Hb 11.7, AST 20, ALT 13, GFR 81, creatinine 0.9  Present today with his daughter. Denies chest pain, pressure, tightness. Reports no SOB nor DOE. Has had some LE edema over the last week or so. Not painful. Endorses he might have been less strict on his oral intake. He attributes swelling to his hyponatremia (followed by nephrology).   EKGs/Labs/Other Studies Reviewed:   The following studies were reviewed today: Echo 03/27/19 1. The left ventricle has normal systolic function with an ejection  fraction of 60-65%. The cavity size was normal. There is mildly increased  left ventricular wall thickness. Left ventricular diastolic function could  not be evaluated secondary to atrial   fibrillation. No evidence of left ventricular regional wall motion  abnormalities.   2. The right ventricle has mildly reduced systolic function. The cavity  was moderately enlarged. There is no increase in right ventricular wall  thickness. Right ventricular systolic pressure is moderately elevated with  an estimated pressure of 45 mmHg.   3. Left atrial size was mildly dilated.   4. Right atrial size was moderately dilated.   5. Mildly thickened tricuspid valve leaflets.   6. The mitral valve is degenerative. Moderate thickening of the mitral  valve leaflet.   7. The tricuspid valve is degenerative. Tricuspid valve regurgitation is  mild-moderate.   8. The aortic valve is tricuspid. Moderate thickening of the aortic  valve. Moderate calcification of the aortic valve. Aortic valve  regurgitation is trivial by color flow Doppler. Mild-moderate stenosis of  the aortic valve.   9. The aortic root is normal in size and structure.  10. The inferior vena cava was dilated in size with <50% respiratory  variability.    EKG:  EKG is ordered today.  The ekg ordered today demonstrates SR 62 bpm with RBBB and LAFB - bifasicular block is stable compared to previous tracing.   Recent Labs: 04/19/2019:  TSH 3.159 06/22/2019: BUN 27; Creatinine, Ser 0.87; Hemoglobin 8.5; Platelets 188; Potassium 4.1; Sodium 132  Recent Lipid Panel    Component Value Date/Time   CHOL 114 05/26/2018 0411   TRIG 49 05/26/2018 0411   HDL 43 05/26/2018 0411   CHOLHDL 2.7 05/26/2018 0411   VLDL 10 05/26/2018 0411   LDLCALC 61 05/26/2018 0411    Home Medications   No outpatient medications have been marked as taking for the 01/06/20 encounter (Appointment) with Loel Dubonnet, NP.   Current Facility-Administered Medications for the 01/06/20 encounter (Appointment) with Loel Dubonnet, NP  Medication  . omalizumab Arvid Right) prefilled syringe 300 mg  . omalizumab Arvid Right) prefilled syringe 75 mg      Review of Systems    Review of Systems  Constitution: Negative for chills, fever and malaise/fatigue.  Cardiovascular: Negative for chest pain, dyspnea on exertion, irregular heartbeat, leg swelling, near-syncope, orthopnea, palpitations and  syncope.  Respiratory: Negative for cough, shortness of breath and wheezing.   Gastrointestinal: Negative for melena, nausea and vomiting.  Genitourinary: Negative for hematuria.  Neurological: Negative for dizziness, light-headedness and weakness.   All other systems reviewed and are otherwise negative except as noted above.  Physical Exam    VS:  There were no vitals taken for this visit. , BMI There is no height or weight on file to calculate BMI. GEN: Well nourished, well developed, in no acute distress. HEENT: normal. Neck: Supple, no JVD, carotid bruits, or masses. Cardiac: RRR, no rubs, or gallops. Gr 2/6 systolic murmur, No clubbing, cyanosis. Bilateral LE with nonpitting-1+ pitting edema. .  Radials/PT 2+ and equal bilaterally.  Respiratory:  Respirations regular and unlabored, clear to auscultation bilaterally. GI: Soft, nontender, nondistended MS: No deformity or atrophy. Skin: Warm and dry, no rash. Neuro:  Strength and sensation are intact. Psych:  Normal affect.  Assessment & Plan    1. PAF - No recurrence. Maintaining SR. Continue Metoprolol 12.5mg  BID. Continue Eliquis 5mg  BID for CHADS2VASC of at least 4. No bleeding complications.   2. Bifasicular heart block - Stable finding on EKG. No lightheadedness, dizziness, near-syncope, syncope. He and his daughter were educated to report these symptoms.  3. Bilateral lower extremity edema - Likely secondary to diastolic heart failure. Fluid restriction encouraged in setting of LE edema and chronic hyponatremia. Continue Lasix 20mg  daily. He may take an additional half or full tablet as needed for swelling. Recommend elevating lower extremities when sitting. He has been unable to wear compression stockings, but is agreeable to try diabetic socks.   4. Chronic anticoagulation - Secondary to PAF. No bleeding complications. Continue Eliquis 5mg  BID .  5. Chronic diastolic heart failure - Mild exacerbation with nonpitting-1+ bilateral LE edema to ankle. He will take an additional 10mg  of Lasix tomorrow morning for a total dose of 30mg , then return to 20mg  daily.   6. Aortic stenosis - Mild to moderate on most recent echo. Plan to repeat echo June 2021. No chest pain, SOB. Noted LE edema, plan as above.   7. HLD - Follows with PCP. Continue statin.  8. Hyponatremia/CKDII - Follows with nephrology.   Disposition: Echo in June for monitoring of aortic stenosis. Follow up in June   with Dr. Fletcher Anon after echocardiogram.   Loel Dubonnet, NP 01/01/2020, 8:00 AM

## 2020-01-06 ENCOUNTER — Encounter: Payer: Self-pay | Admitting: Family

## 2020-01-06 ENCOUNTER — Ambulatory Visit (INDEPENDENT_AMBULATORY_CARE_PROVIDER_SITE_OTHER): Payer: Medicare PPO | Admitting: Family

## 2020-01-06 ENCOUNTER — Other Ambulatory Visit: Payer: Self-pay

## 2020-01-06 VITALS — BP 100/56 | HR 62 | Ht 69.0 in | Wt 175.5 lb

## 2020-01-06 DIAGNOSIS — I4819 Other persistent atrial fibrillation: Secondary | ICD-10-CM | POA: Diagnosis not present

## 2020-01-06 DIAGNOSIS — I5032 Chronic diastolic (congestive) heart failure: Secondary | ICD-10-CM

## 2020-01-06 DIAGNOSIS — N182 Chronic kidney disease, stage 2 (mild): Secondary | ICD-10-CM | POA: Diagnosis not present

## 2020-01-06 DIAGNOSIS — I350 Nonrheumatic aortic (valve) stenosis: Secondary | ICD-10-CM | POA: Diagnosis not present

## 2020-01-06 DIAGNOSIS — R600 Localized edema: Secondary | ICD-10-CM | POA: Diagnosis not present

## 2020-01-06 DIAGNOSIS — I10 Essential (primary) hypertension: Secondary | ICD-10-CM | POA: Diagnosis not present

## 2020-01-06 MED ORDER — FUROSEMIDE 20 MG PO TABS: 20.0000 mg | ORAL_TABLET | Freq: Every day | ORAL | 1 refills | Status: DC

## 2020-01-06 NOTE — Patient Instructions (Addendum)
Medication Instructions:  Your physician has recommended you make the following change in your medication:   You may take an extra half tablet or full tablet of Lasix as needed for swelling.  *If you need a refill on your cardiac medications before your next appointment, please call your pharmacy*  Lab Work: No lab work today.  Testing/Procedures: You had an EKG today. It showed a bifasicular heart block. This is a stable finding for you. This is a slowing of two of the electrical pathways in the bottom chambers of your heart. This is very common. We simply would like you to let us know if you develop new lightheadedness, almost passing out, or passing out.   Your physician has requested that you have an echocardiogram in June for monitoring of your aortic valve. Echocardiography is a painless test that uses sound waves to create images of your heart. It provides your doctor with information about the size and shape of your heart and how well your heart's chambers and valves are working. This procedure takes approximately one hour. There are no restrictions for this procedure.    Follow-Up: At [facility], you and your health needs are our priority.  As part of our continuing mission to provide you with exceptional heart care, we have created designated Provider Care Teams.  These Care Teams include your primary Cardiologist (physician) and Advanced Practice Providers (APPs -  Physician Assistants and Nurse Practitioners) who all work together to provide you with the care you need, when you need it.  Your next appointment:   Echocardiogram in June and follow up in-office with Dr. Fletcher Anon the week after.   Other Instructions

## 2020-01-13 ENCOUNTER — Telehealth: Payer: Self-pay | Admitting: *Deleted

## 2020-01-13 NOTE — Telephone Encounter (Signed)
This is a fairly common side effect---especially with the second vaccine. Elevate the arm---and cool compresses or even ice briefly might help Tylenol is also worth trying It should go away on its own over the next few days

## 2020-01-13 NOTE — Telephone Encounter (Signed)
Spoke to [place]. He has been using an itch cream. He will do what was suggested, also.

## 2020-01-13 NOTE — Telephone Encounter (Signed)
Patient's daughter called stating that her dad had his second covid vaccine Saturday. [name] stated her dad's arm was a little painful after the first vaccine. [name] stated that Saturday her dad complained that his arm hurt. [name] stated yesterday he complained that his arm was still hurting, red and a little warm to the touch. [name] stated her mom rubbed some alcohol on the rash last night. [name] stated that her dad is still complaining of his arm hurting and now the redness/rash is down to his elbow and warm to the touch. [name] stated that she put some cortisone on the rash about two hours ago and there has not been any change. [name] denies patient having any other side effects at this time. [name] wanted to know what Dr. Silvio Pate recommends that she do for her dad's arm?

## 2020-01-17 ENCOUNTER — Ambulatory Visit
Admission: RE | Admit: 2020-01-17 | Discharge: 2020-01-17 | Disposition: A | Discharge status: Home / Self Care | Payer: Medicare PPO | Source: Ambulatory Visit | Attending: Urology | Admitting: Urology

## 2020-01-17 ENCOUNTER — Other Ambulatory Visit: Payer: Self-pay

## 2020-01-17 DIAGNOSIS — C641 Malignant neoplasm of right kidney, except renal pelvis: Secondary | ICD-10-CM | POA: Insufficient documentation

## 2020-01-17 DIAGNOSIS — R079 Chest pain, unspecified: Secondary | ICD-10-CM | POA: Diagnosis not present

## 2020-01-17 LAB — POCT I-STAT CREATININE: Creatinine, Ser: 1 mg/dL (ref 0.61–1.24)

## 2020-01-17 MED ORDER — IOHEXOL 300 MG/ML  SOLN
100.0000 mL | Freq: Once | INTRAMUSCULAR | Status: AC | PRN
  Administered 2020-01-17: 15:00:00 100 mL via INTRAVENOUS

## 2020-01-20 ENCOUNTER — Telehealth: Payer: Self-pay | Admitting: Internal Medicine

## 2020-01-20 DIAGNOSIS — C641 Malignant neoplasm of right kidney, except renal pelvis: Secondary | ICD-10-CM | POA: Diagnosis not present

## 2020-01-20 DIAGNOSIS — D649 Anemia, unspecified: Secondary | ICD-10-CM | POA: Diagnosis not present

## 2020-01-20 DIAGNOSIS — E871 Hypo-osmolality and hyponatremia: Secondary | ICD-10-CM | POA: Diagnosis not present

## 2020-01-20 DIAGNOSIS — R600 Localized edema: Secondary | ICD-10-CM | POA: Diagnosis not present

## 2020-01-20 DIAGNOSIS — N182 Chronic kidney disease, stage 2 (mild): Secondary | ICD-10-CM | POA: Diagnosis not present

## 2020-01-20 MED ORDER — GABAPENTIN 300 MG PO CAPS: 300.0000 mg | ORAL_CAPSULE | Freq: Three times a day (TID) | ORAL | 11 refills | Status: DC

## 2020-01-20 NOTE — Telephone Encounter (Signed)
Spoke to [place]. I sent in the refill.

## 2020-01-20 NOTE — Telephone Encounter (Signed)
Patient's daughter called to request refill  She stated that when she spoke with the pharmacy they advised her that when they called our office declined the refill .    Patient is completely out of medication   GABAPENTIN    [place]

## 2020-01-21 ENCOUNTER — Encounter: Payer: Self-pay | Admitting: Urology

## 2020-01-21 ENCOUNTER — Ambulatory Visit: Payer: Medicare PPO | Admitting: Urology

## 2020-01-21 ENCOUNTER — Other Ambulatory Visit: Payer: Self-pay

## 2020-01-21 VITALS — BP 130/80 | HR 74

## 2020-01-21 DIAGNOSIS — C641 Malignant neoplasm of right kidney, except renal pelvis: Secondary | ICD-10-CM

## 2020-01-21 DIAGNOSIS — N319 Neuromuscular dysfunction of bladder, unspecified: Secondary | ICD-10-CM | POA: Diagnosis not present

## 2020-01-21 NOTE — Progress Notes (Signed)
01/21/2020 7:41 PM   [name] 05-04-1934 [id number]  Referring provider: [name], MD [address] [place],  [place] [address]  Chief Complaint  Patient presents with  . Follow-up    HPI: 86 yo M with h/o RCC here for follow up of this with CT/ CXR.  s/p right hand-assisted laparoscopic nephrectomy on 06/17/2023 proximately 3 cm right lower pole renal lesion with concern for invasion of the renal sinus.  His postoperative course was complicated by an ileus resolved spontaneously and delayed mobility.  He is ultimately discharged approximately 1 week postop to home with physical therapy.  Surgical pathology consistent with pT3aNx , Furhman grade 2 with invasion into the renal sinus.  Cr 1.0 on 01/17/20  Most recent CT/ CXR reviewed today, NED, see below.   He does have a personal history prostate cancer status post radical prostatectomy 1993 by Dr. [name] and salvage radiation in 1998. He developed a small capacity bladder and is been managed with indwelling Foley catheter.  He continues to have chronic issues with his Foley catheter now on Myrbetriq for bladder spasms.  He is very particular about who changed his catheter and when.  He underwent cystoscopy in 08/2018 with Dr. [name] at which time no obvious bladder pathology was identified.   PMH: Past Medical History:  Diagnosis Date  . (HFpEF) heart failure with preserved ejection fraction ([place])    a. 02/2019 Echo: Ef 60-65%, mildly reduced RV fxn. RVSP 46mmHg. Mildly dil LA. Mod dil RA. Mild to mod TR. Mild to mod AS. Triv AI.  Marland Kitchen Allergy   . Anemia   . Anxiety   . Arthritis    rheumatoid  . Asthma   . Basal cell carcinoma    back  . CAD (coronary artery disease)   . CHF (congestive heart failure) ([place])   . Chronic kidney disease    Mass right kidney  . Chronic sinusitis   . Collagen vascular disease (HCC)    Rheumatoid Arthritis  . Depression   . Diverticulitis   . Dysrhythmia   . ED  (erectile dysfunction)   . GERD (gastroesophageal reflux disease)   . History of SIADH   . Hx of adenomatous colonic polyps   . Hypertension   . Hyponatremia   . IBS (irritable bowel syndrome)   . Neurodermatitis   . Neurogenic bladder   . OSA (obstructive sleep apnea)    no CPAP since weight loss  . Persistent atrial fibrillation ([place])    a. Dx 02/2019; b. CHA2DS2VASc = 6-->eliquis initiated.  . Prostate cancer ([place])   . Renal mass    10/2018 4.2cm R renal cortical mass. Most compatible w/ clear cell renal cell carcinoma.    Surgical History: Past Surgical History:  Procedure Laterality Date  . CARDIOVERSION N/A 04/23/2019   Procedure: CARDIOVERSION;  Surgeon: Nelva Bush, MD;  Location: [facility] ORS;  Service: Cardiovascular;  Laterality: N/A;  . KNEE ARTHROPLASTY Right 09/02/2015   Procedure: COMPUTER ASSISTED TOTAL KNEE ARTHROPLASTY;  Surgeon: [name], MD;  Location: [facility] ORS;  Service: Orthopedics;  Laterality: Right;  . LAPAROSCOPIC NEPHRECTOMY, HAND ASSISTED Right 06/17/2019   Procedure: HAND ASSISTED LAPAROSCOPIC NEPHRECTOMY;  Surgeon: Hollice Espy, MD;  Location: [facility] ORS;  Service: Urology;  Laterality: Right;  . NASAL SINUS SURGERY  2009   DEVIATED SEPTUM AND POLYPS  . permanent indwelling catheter    . PROSTATE SURGERY     PROSTATECTOMY  . TOTAL KNEE ARTHROPLASTY Left 9/15  Dr [name]  . URETHRAL STRICTURE DILATATION  02-2010   Dr.Cope    Home Medications:  Allergies as of 01/21/2020      Reactions   Ciprofloxacin Nausea And Vomiting   Headache   Citalopram Hydrobromide Other (See Comments)   "unknown"   Clindamycin Nausea And Vomiting   Lorazepam Other (See Comments)   Adverse reaction   Paroxetine Nausea Only   Ramipril Other (See Comments)   "unknown"   Simvastatin    Sulfa Antibiotics Other (See Comments)      Medication List       Accurate as of January 21, 2020  7:41 PM. If you have any questions, ask your nurse or doctor.          acetaminophen 500 MG tablet Commonly known as: TYLENOL Take 500 mg by mouth every 8 (eight) hours as needed for moderate pain or headache.   albuterol 108 (90 Base) MCG/ACT inhaler Commonly known as: VENTOLIN HFA Inhale 2 puffs into the lungs every 6 (six) hours as needed for wheezing or shortness of breath.   Align 4 MG Caps Take 1 capsule (4 mg total) by mouth daily.   ALPRAZolam 0.5 MG tablet Commonly known as: XANAX TAKE 1 TABLET BY MOUTH THREE TIMES DAILY AS NEEDED FOR ANXIETY OR SLEEP   apixaban 5 MG Tabs tablet Commonly known as: Eliquis Take 1 tablet (5 mg total) by mouth 2 (two) times daily.   Co Q 10 100 MG Caps Take 100 mg by mouth daily.   fluticasone 50 MCG/ACT nasal spray Commonly known as: FLONASE Place 2 sprays into both nostrils 2 (two) times daily.   folic acid A999333 MCG tablet Commonly known as: FOLVITE Take 1,200 mcg by mouth daily.   furosemide 20 MG tablet Commonly known as: LASIX Take 1 tablet (20 mg total) by mouth daily.   gabapentin 300 MG capsule Commonly known as: NEURONTIN Take 1 capsule (300 mg total) by mouth 3 (three) times daily.   hydroxychloroquine 200 MG tablet Commonly known as: PLAQUENIL Take 200 mg by mouth 2 (two) times daily.   methotrexate 250 MG/10ML injection Inject 0.8 mLs into the skin once a week.   metoprolol tartrate 25 MG tablet Commonly known as: LOPRESSOR Take 0.5 tablets (12.5 mg total) by mouth 2 (two) times daily.   multivitamin tablet Take 1 tablet by mouth daily.   Myrbetriq 50 MG Tb24 tablet Generic drug: mirabegron ER TAKE 1 TABLET(50 MG) BY MOUTH DAILY   polyethylene glycol powder 17 GM/SCOOP powder Commonly known as: GLYCOLAX/MIRALAX Take 17 g by mouth at bedtime.   pravastatin 20 MG tablet Commonly known as: PRAVACHOL Take 1 tablet (20 mg total) by mouth at bedtime.   Selenium 200 MCG Caps Take 200 mcg by mouth daily.   sodium chloride 1 g tablet Take 1 tablet (1 g total) by mouth 2 (two)  times daily with a meal. What changed: when to take this   Turmeric 500 MG Caps Take 500 mg by mouth daily.   VITAMIN C PO Take by mouth.   Vitamin D3 125 MCG (5000 UT) Caps Take 5,000 Units by mouth daily.   Zinc 50 MG Tabs Take by mouth.       Allergies:  Allergies  Allergen Reactions  . Ciprofloxacin Nausea And Vomiting    Headache  . Citalopram Hydrobromide Other (See Comments)    "unknown"  . Clindamycin Nausea And Vomiting  . Lorazepam Other (See Comments)    Adverse reaction  .  Paroxetine Nausea Only  . Ramipril Other (See Comments)    "unknown"  . Simvastatin   . Sulfa Antibiotics Other (See Comments)    Family History: Family History  Problem Relation Age of Onset  . Heart disease Mother 62       heart failure  . Pneumonia Father 58       pnemonia  . Skin cancer Father   . Skin cancer Sister   . Skin cancer Son   . Colon cancer Neg Hx   . Esophageal cancer Neg Hx   . Stomach cancer Neg Hx   . Pancreatic cancer Neg Hx   . Liver disease Neg Hx     Social History:  reports that he has never smoked. He has never used smokeless tobacco. He reports current alcohol use. He reports that he does not use drugs.   Physical Exam: BP 130/80   Pulse 74   Constitutional:  Alert and oriented, No acute distress.  Daughter present.  Tangential but coherent. HEENT: [place] AT, moist mucus membranes.  Trachea midline, no masses. Cardiovascular: No clubbing, cyanosis, or edema. Respiratory: Normal respiratory effort, no increased work of breathing. Skin: No rashes, bruises or suspicious lesions. Neurologic: Grossly intact, no focal deficits, moving all 4 extremities. Psychiatric: Normal mood and affect.  Laboratory Data: Lab Results  Component Value Date   WBC 4.7 06/22/2019   HGB 8.5 (L) 06/22/2019   HCT 25.5 (L) 06/22/2019   MCV 96.7 06/22/2019   PLT 188 06/22/2019    Lab Results  Component Value Date   CREATININE 1.00 01/17/2020    Lab Results    Component Value Date   PSA 0.00 (L) 04/27/2017   PSA 0.01 (L) 05/18/2016   PSA 0.01 (L) 01/26/2015     Lab Results  Component Value Date   HGBA1C 5.1 05/26/2018    Pertinent Imaging: CT scan 01/17/2020 and CXR personally reviewed, agree with radiologic interpretation  Assessment & Plan:    1. Renal cell cancer, right (HCC) NED  Based on T3, NCCN guidelines, q6 month imaging, however, given age and comorbities, options likely limited   Argree for q yearly imaging - CT Abdomen Pelvis W Contrast; Future - Chest 1 View; Future  2. Neurogenic bladder Foley exchanged today Discussed options for leakage, caution against upsizing and anticholergics Continue mybetriq Foley exchanged  Return in about 1 year (around 01/20/2021) for CT scan, CXR.  Hollice Espy, MD  [facility] [address] [place], [place] [address] [phone]  I spent 40 total minutes on the day of the encounter including pre-visit review of the medical record, face-to-face time with the patient, and post visit ordering of labs/imaging/tests.

## 2020-01-21 NOTE — Progress Notes (Signed)
Cath Change/ Replacement  Patient is present today for a catheter change due to urinary retention.  11ml of water was removed from the balloon, a 16FR foley cath was removed with out difficulty.  Patient was cleaned and prepped in a sterile fashion with betadine. A 16 FR foley cath was replaced into the bladder no complications were noted Urine return was noted 24ml and urine was yellow in color. The balloon was filled with 59ml of sterile water. It was attached to his leg bag.  Patient was given proper instruction on catheter care.    Performed by: Verlene Mayer, [place]

## 2020-01-23 ENCOUNTER — Ambulatory Visit: Payer: Self-pay | Admitting: Physician Assistant

## 2020-01-24 ENCOUNTER — Telehealth: Payer: Self-pay | Admitting: Pulmonary Disease

## 2020-01-24 NOTE — Telephone Encounter (Signed)
Called and spoke Patient. Patient scheduled 01/27/20, at 1500.  Nothing further at this time.

## 2020-01-27 ENCOUNTER — Ambulatory Visit (INDEPENDENT_AMBULATORY_CARE_PROVIDER_SITE_OTHER): Payer: Medicare PPO | Admitting: Physician Assistant

## 2020-01-27 ENCOUNTER — Other Ambulatory Visit: Payer: Self-pay

## 2020-01-27 ENCOUNTER — Ambulatory Visit (INDEPENDENT_AMBULATORY_CARE_PROVIDER_SITE_OTHER): Payer: Medicare PPO

## 2020-01-27 ENCOUNTER — Other Ambulatory Visit: Payer: Self-pay | Admitting: Urology

## 2020-01-27 ENCOUNTER — Encounter: Payer: Self-pay | Admitting: Physician Assistant

## 2020-01-27 VITALS — Ht 71.0 in | Wt 175.0 lb

## 2020-01-27 DIAGNOSIS — J4550 Severe persistent asthma, uncomplicated: Secondary | ICD-10-CM

## 2020-01-27 DIAGNOSIS — T839XXA Unspecified complication of genitourinary prosthetic device, implant and graft, initial encounter: Secondary | ICD-10-CM | POA: Diagnosis not present

## 2020-01-27 MED ORDER — OMALIZUMAB 150 MG/ML ~~LOC~~ SOSY
300.0000 mg | PREFILLED_SYRINGE | Freq: Once | SUBCUTANEOUS | Status: AC
  Administered 2020-01-27: 300 mg via SUBCUTANEOUS

## 2020-01-27 MED ORDER — OMALIZUMAB 75 MG/0.5ML ~~LOC~~ SOSY
75.0000 mg | PREFILLED_SYRINGE | Freq: Once | SUBCUTANEOUS | Status: AC
  Administered 2020-01-27: 75 mg via SUBCUTANEOUS

## 2020-01-27 NOTE — Progress Notes (Signed)
01/27/2020 12:15 PM   [name] 03-31-1934 [id number]  CC: Nondraining Foley catheter, leakage around catheter tubing  HPI: [name] is a 86 y.o. male who presents today for evaluation of nondraining Foley catheter and leakage around his catheter tubing.  He first noticed this problem yesterday and woke up this morning to note significant leakage overnight in his bed.  He notes that his leg bag has remained empty during this time.  He is wearing absorbent underwear and pads to collect leakage.  Past urologic history significant for RCC s/p right nephrectomy, prostate cancer s/p radical prostatectomy and salvage radiation with a known small capacity bladder on Myrbetriq, and chronic indwelling Foley catheter most recently exchanged in clinic 6 days ago.  He reports recent worsened BLE edema.  He has been taking Lasix, elevating his legs, and wearing compressive socks in an attempt to diurese.  He believes he has lost several pounds of fluid in the past week due to these changes.    Foley catheter in place today without drainage in the tubing or leg bag.  He was noted to have significant urinary leakage around his catheter tubing.  PMH: Past Medical History:  Diagnosis Date  . (HFpEF) heart failure with preserved ejection fraction ([place])    a. 02/2019 Echo: Ef 60-65%, mildly reduced RV fxn. RVSP 50mmHg. Mildly dil LA. Mod dil RA. Mild to mod TR. Mild to mod AS. Triv AI.  Marland Kitchen Allergy   . Anemia   . Anxiety   . Arthritis    rheumatoid  . Asthma   . Basal cell carcinoma    back  . CAD (coronary artery disease)   . CHF (congestive heart failure) ([place])   . Chronic kidney disease    Mass right kidney  . Chronic sinusitis   . Collagen vascular disease (HCC)    Rheumatoid Arthritis  . Depression   . Diverticulitis   . Dysrhythmia   . ED (erectile dysfunction)   . GERD (gastroesophageal reflux disease)   . History of SIADH   . Hx of adenomatous colonic polyps   . Hypertension    . Hyponatremia   . IBS (irritable bowel syndrome)   . Neurodermatitis   . Neurogenic bladder   . OSA (obstructive sleep apnea)    no CPAP since weight loss  . Persistent atrial fibrillation ([place])    a. Dx 02/2019; b. CHA2DS2VASc = 6-->eliquis initiated.  . Prostate cancer ([place])   . Renal mass    10/2018 4.2cm R renal cortical mass. Most compatible w/ clear cell renal cell carcinoma.    Surgical History: Past Surgical History:  Procedure Laterality Date  . CARDIOVERSION N/A 04/23/2019   Procedure: CARDIOVERSION;  Surgeon: Nelva Bush, MD;  Location: [facility] ORS;  Service: Cardiovascular;  Laterality: N/A;  . KNEE ARTHROPLASTY Right 09/02/2015   Procedure: COMPUTER ASSISTED TOTAL KNEE ARTHROPLASTY;  Surgeon: [name], MD;  Location: [facility] ORS;  Service: Orthopedics;  Laterality: Right;  . LAPAROSCOPIC NEPHRECTOMY, HAND ASSISTED Right 06/17/2019   Procedure: HAND ASSISTED LAPAROSCOPIC NEPHRECTOMY;  Surgeon: Hollice Espy, MD;  Location: [facility] ORS;  Service: Urology;  Laterality: Right;  . NASAL SINUS SURGERY  2009   DEVIATED SEPTUM AND POLYPS  . permanent indwelling catheter    . PROSTATE SURGERY     PROSTATECTOMY  . TOTAL KNEE ARTHROPLASTY Left 9/15   Dr [name]  . URETHRAL STRICTURE DILATATION  02-2010   Dr.Cope    Home Medications:  Allergies as of 01/27/2020  Reactions   Ciprofloxacin Nausea And Vomiting   Headache   Citalopram Hydrobromide Other (See Comments)   "unknown"   Clindamycin Nausea And Vomiting   Lorazepam Other (See Comments)   Adverse reaction   Paroxetine Nausea Only   Ramipril Other (See Comments)   "unknown"   Simvastatin    Sulfa Antibiotics Other (See Comments)      Medication List       Accurate as of January 27, 2020 12:15 PM. If you have any questions, ask your nurse or doctor.        acetaminophen 500 MG tablet Commonly known as: TYLENOL Take 500 mg by mouth every 8 (eight) hours as needed for moderate pain or headache.     albuterol 108 (90 Base) MCG/ACT inhaler Commonly known as: VENTOLIN HFA Inhale 2 puffs into the lungs every 6 (six) hours as needed for wheezing or shortness of breath.   Align 4 MG Caps Take 1 capsule (4 mg total) by mouth daily.   ALPRAZolam 0.5 MG tablet Commonly known as: XANAX TAKE 1 TABLET BY MOUTH THREE TIMES DAILY AS NEEDED FOR ANXIETY OR SLEEP   apixaban 5 MG Tabs tablet Commonly known as: Eliquis Take 1 tablet (5 mg total) by mouth 2 (two) times daily.   Co Q 10 100 MG Caps Take 100 mg by mouth daily.   fluticasone 50 MCG/ACT nasal spray Commonly known as: FLONASE Place 2 sprays into both nostrils 2 (two) times daily.   folic acid A999333 MCG tablet Commonly known as: FOLVITE Take 1,200 mcg by mouth daily.   furosemide 20 MG tablet Commonly known as: LASIX Take 1 tablet (20 mg total) by mouth daily.   gabapentin 300 MG capsule Commonly known as: NEURONTIN Take 1 capsule (300 mg total) by mouth 3 (three) times daily.   hydroxychloroquine 200 MG tablet Commonly known as: PLAQUENIL Take 200 mg by mouth 2 (two) times daily.   methotrexate 250 MG/10ML injection Inject 0.8 mLs into the skin once a week.   metoprolol tartrate 25 MG tablet Commonly known as: LOPRESSOR Take 0.5 tablets (12.5 mg total) by mouth 2 (two) times daily.   multivitamin tablet Take 1 tablet by mouth daily.   Myrbetriq 50 MG Tb24 tablet Generic drug: mirabegron ER TAKE 1 TABLET(50 MG) BY MOUTH DAILY   polyethylene glycol powder 17 GM/SCOOP powder Commonly known as: GLYCOLAX/MIRALAX Take 17 g by mouth at bedtime.   pravastatin 20 MG tablet Commonly known as: PRAVACHOL Take 1 tablet (20 mg total) by mouth at bedtime.   Selenium 200 MCG Caps Take 200 mcg by mouth daily.   sodium chloride 1 g tablet Take 1 tablet (1 g total) by mouth 2 (two) times daily with a meal. What changed: when to take this   Turmeric 500 MG Caps Take 500 mg by mouth daily.   VITAMIN C PO Take by  mouth.   Vitamin D3 125 MCG (5000 UT) Caps Take 5,000 Units by mouth daily.   Zinc 50 MG Tabs Take by mouth.       Allergies:  Allergies  Allergen Reactions  . Ciprofloxacin Nausea And Vomiting    Headache  . Citalopram Hydrobromide Other (See Comments)    "unknown"  . Clindamycin Nausea And Vomiting  . Lorazepam Other (See Comments)    Adverse reaction  . Paroxetine Nausea Only  . Ramipril Other (See Comments)    "unknown"  . Simvastatin   . Sulfa Antibiotics Other (See Comments)    Family  History: Family History  Problem Relation Age of Onset  . Heart disease Mother 66       heart failure  . Pneumonia Father 108       pnemonia  . Skin cancer Father   . Skin cancer Sister   . Skin cancer Son   . Colon cancer Neg Hx   . Esophageal cancer Neg Hx   . Stomach cancer Neg Hx   . Pancreatic cancer Neg Hx   . Liver disease Neg Hx     Social History:   reports that he has never smoked. He has never used smokeless tobacco. He reports current alcohol use. He reports that he does not use drugs.  Physical Exam: Ht 5\' 11"  (1.803 m)   Wt 175 lb (79.4 kg)   BMI 24.41 kg/m   Constitutional:  Alert and oriented, no acute distress, nontoxic appearing HEENT: [place], AT Cardiovascular: No clubbing, cyanosis, or edema Respiratory: Normal respiratory effort, no increased work of breathing GU: Circumcised penis Skin: No rashes, bruises or suspicious lesions Neurologic: Grossly intact, no focal deficits, moving all 4 extremities Psychiatric: Normal mood and affect  Assessment & Plan:   1. Foley catheter problem, initial encounter [facility]) 86 year old male with PMH RCC s/p right nephrectomy, prostate cancer s/p radical prostatectomy and salvage radiation with known small capacity bladder, chronic indwelling Foley with bladder spasms despite Myrbetriq presents with complaints of nondraining Foley catheter and significant urinary leakage around his catheter tubing.  I attempted to  irrigate his Foley catheter in clinic today.  I instilled 60 mL of sterile water into his bladder and was unable to get return.  I deflated his Foley catheter balloon, advanced the Foley catheter, and noted immediate unmeasured efflux from his catheter tubing.  I attempted to irrigate his bladder a second time and continued to experience difficulty both with instilling sterile water into the bladder and producing urinary return.  Based on these findings and due to concern for possible Foley encrustation, I elected for Foley catheter replacement in clinic today.  See separate procedure note for details.  Old Foley catheter was removed from the bladder without incident. I palpated the tubing and was not able to appreciate any notable encrustation.  Upon placement of the new Foley catheter, I instilled the bladder with 101mL of sterile water, reattached the patient's leg bag, and noted immediate drainage into the Foley catheter bag, thus confirming appropriate Foley catheter placement.  Additionally, we obtained a bladder scan in clinic which confirmed an empty bladder aside from the anticipated 10 cc Foley balloon capacity.  Based on these findings and my confirmation of appropriate Foley catheter placement in clinic, I suspect the patient's complaint today is a manifestation of his known low capacity bladder, bladder spasms, and recent attempts to diurese.  I explained to the patient that I suspect he is producing more urinary volume in his bladder can accommodate, which is exacerbating his bladder spasms and leading to significant urinary leakage.  I counseled him to continue to wear absorbent underwear pads as needed for security, as her treatment options for this problem are limited at this time.  I counseled him to contact our office if he developed a nondraining Foley catheter, the absence of leakage around his Foley catheter, and the sensation of a full bladder simultaneously.  He expressed  understanding.  Return if symptoms worsen or fail to improve.   I spent 35 minutes on the day of the encounter to include pre-visit record review, face-to-face  time with the patient, and post-visit ordering of tests.   Debroah Loop, PA-C  [facility] [address], [place], [place] [address] [phone]

## 2020-01-27 NOTE — Progress Notes (Signed)
All questions were answered by the patient before medication was administered. Have you been hospitalized in the last 10 days? No Do you have a fever? No Do you have a cough? No Do you have a headache or sore throat? No  

## 2020-01-27 NOTE — Progress Notes (Signed)
Cath Change/ Replacement  Patient is present today for a catheter change due to nondraining Foley catheter.  20ml of water was removed from the balloon, a 16FR foley cath was removed with out difficulty.  Patient was cleaned and prepped in a sterile fashion with betadine and 2% lidocaine jelly was instilled into the urethra. A 16 FR foley cath was replaced into the bladder no complications were noted Urine return was noted 49ml and urine was clear in color. The balloon was filled with 1ml of sterile water.  Catheter was attached to the patient's leg bag and secured to his anterior right leg.  He declined additional drainage bags. Patient was given proper instruction on catheter care.    Performed by: Debroah Loop, PA-C

## 2020-02-03 ENCOUNTER — Telehealth: Payer: Self-pay | Admitting: Pulmonary Disease

## 2020-02-03 ENCOUNTER — Other Ambulatory Visit: Payer: Self-pay

## 2020-02-03 DIAGNOSIS — H903 Sensorineural hearing loss, bilateral: Secondary | ICD-10-CM | POA: Diagnosis not present

## 2020-02-03 DIAGNOSIS — H6123 Impacted cerumen, bilateral: Secondary | ICD-10-CM | POA: Diagnosis not present

## 2020-02-03 MED ORDER — ALPRAZOLAM 0.5 MG PO TABS: ORAL_TABLET | ORAL | 0 refills | Status: DC

## 2020-02-03 NOTE — Telephone Encounter (Signed)
Xolair Prefilled Syringe Order: 150mg  Prefilled Syringe:  #4 75mg  Prefilled Syringe: #2 Ordered Date: 02/03/2020 Expected date of arrival: 02/04/2020 Ordered by: Desmond Dike, [place]  Specialty Pharm[facility]: [facility]

## 2020-02-03 NOTE — Telephone Encounter (Signed)
Patient is requesting a refill on Alprazolam. This was last refilled on 12/09/19 for #90 with 0 refills. Patient was last seen on 11/11/19.  Patient would like this sent into [facility] on [address] [place].  Is this ok to refill?

## 2020-02-04 NOTE — Telephone Encounter (Signed)
Xolair Prefilled Syringe Received:  150mg  Prefilled Syringe >> quantity #4, lot # [id number], exp date 09/2020 75mg  Prefilled Syringe >> quantity #2, lot # [id number], exp date 07/2020 Medication arrival date: 02/04/2020 Received by: Desmond Dike, [place]

## 2020-02-10 ENCOUNTER — Ambulatory Visit: Payer: Medicare PPO

## 2020-02-11 ENCOUNTER — Ambulatory Visit (INDEPENDENT_AMBULATORY_CARE_PROVIDER_SITE_OTHER): Payer: Medicare PPO | Admitting: Physician Assistant

## 2020-02-11 ENCOUNTER — Telehealth: Payer: Self-pay

## 2020-02-11 ENCOUNTER — Encounter: Payer: Self-pay | Admitting: Physician Assistant

## 2020-02-11 ENCOUNTER — Other Ambulatory Visit: Payer: Self-pay

## 2020-02-11 VITALS — BP 130/80 | HR 74 | Ht 74.0 in | Wt 175.0 lb

## 2020-02-11 DIAGNOSIS — R310 Gross hematuria: Secondary | ICD-10-CM | POA: Diagnosis not present

## 2020-02-11 NOTE — Telephone Encounter (Signed)
Patient's daughter called stating that patient's urine in bag is very dark today amber/red in color. She denies clots and patient is not in pain. Patient states he is having some bladder spasms which is normal for him. Patient was told to increase water intake and if his catheter stopped draining or blood became thick and clotted to call back. Otherwise fine for now

## 2020-02-11 NOTE — Patient Instructions (Signed)
1. Stop Eliquis for two days and restart it Thursday evening. 2. Call the office if you develop fever, chills, nausea, vomiting, or constant bladder pain.

## 2020-02-11 NOTE — Progress Notes (Signed)
02/11/2020 4:14 PM   [name] [birth year] [id number]  CC: Gross hematuria  HPI: [name] is a 86 y.o. male with PMH RCC s/p right nephrectomy, prostate cancer s/p radical prostatectomy and salvage radiation with a known small capacity bladder on Myrbetriq, chronic indwelling Foley catheter, and A. fib on Eliquis who presents today for evaluation of gross hematuria.   Patient states he first noticed gross hematuria this morning.  He has continued to have bladder spasms consistent with his baseline.  He denies catheter pulls, fever, chills, nausea, vomiting, constant bladder pain, and flank pain.  PMH: Past Medical History:  Diagnosis Date  . (HFpEF) heart failure with preserved ejection fraction ([place])    a. 02/2019 Echo: Ef 60-65%, mildly reduced RV fxn. RVSP 55mmHg. Mildly dil LA. Mod dil RA. Mild to mod TR. Mild to mod AS. Triv AI.  Marland Kitchen Allergy   . Anemia   . Anxiety   . Arthritis    rheumatoid  . Asthma   . Basal cell carcinoma    back  . CAD (coronary artery disease)   . CHF (congestive heart failure) ([place])   . Chronic kidney disease    Mass right kidney  . Chronic sinusitis   . Collagen vascular disease (HCC)    Rheumatoid Arthritis  . Depression   . Diverticulitis   . Dysrhythmia   . ED (erectile dysfunction)   . GERD (gastroesophageal reflux disease)   . History of SIADH   . Hx of adenomatous colonic polyps   . Hypertension   . Hyponatremia   . IBS (irritable bowel syndrome)   . Neurodermatitis   . Neurogenic bladder   . OSA (obstructive sleep apnea)    no CPAP since weight loss  . Persistent atrial fibrillation ([place])    a. Dx 02/2019; b. CHA2DS2VASc = 6-->eliquis initiated.  . Prostate cancer ([place])   . Renal mass    10/2018 4.2cm R renal cortical mass. Most compatible w/ clear cell renal cell carcinoma.    Surgical History: Past Surgical History:  Procedure Laterality Date  . CARDIOVERSION N/A 04/23/2019   Procedure: CARDIOVERSION;  Surgeon: Nelva Bush, MD;  Location: [facility] ORS;  Service: Cardiovascular;  Laterality: N/A;  . KNEE ARTHROPLASTY Right 09/02/2015   Procedure: COMPUTER ASSISTED TOTAL KNEE ARTHROPLASTY;  Surgeon: [name], MD;  Location: [facility] ORS;  Service: Orthopedics;  Laterality: Right;  . LAPAROSCOPIC NEPHRECTOMY, HAND ASSISTED Right 06/17/2019   Procedure: HAND ASSISTED LAPAROSCOPIC NEPHRECTOMY;  Surgeon: Hollice Espy, MD;  Location: [facility] ORS;  Service: Urology;  Laterality: Right;  . NASAL SINUS SURGERY  2009   DEVIATED SEPTUM AND POLYPS  . permanent indwelling catheter    . PROSTATE SURGERY     PROSTATECTOMY  . TOTAL KNEE ARTHROPLASTY Left 9/15   Dr [name]  . URETHRAL STRICTURE DILATATION  02-2010   Dr.Cope    Home Medications:  Allergies as of 02/11/2020      Reactions   Ciprofloxacin Nausea And Vomiting   Headache   Citalopram Hydrobromide Other (See Comments)   "unknown"   Clindamycin Nausea And Vomiting   Lorazepam Other (See Comments)   Adverse reaction   Paroxetine Nausea Only   Ramipril Other (See Comments)   "unknown"   Simvastatin    Sulfa Antibiotics Other (See Comments)      Medication List       Accurate as of February 11, 2020  4:14 PM. If you have any questions, ask your nurse or doctor.  acetaminophen 500 MG tablet Commonly known as: TYLENOL Take 500 mg by mouth every 8 (eight) hours as needed for moderate pain or headache.   albuterol 108 (90 Base) MCG/ACT inhaler Commonly known as: VENTOLIN HFA Inhale 2 puffs into the lungs every 6 (six) hours as needed for wheezing or shortness of breath.   Align 4 MG Caps Take 1 capsule (4 mg total) by mouth daily.   ALPRAZolam 0.5 MG tablet Commonly known as: XANAX TAKE 1 TABLET BY MOUTH THREE TIMES DAILY AS NEEDED FOR ANXIETY OR SLEEP   apixaban 5 MG Tabs tablet Commonly known as: Eliquis Take 1 tablet (5 mg total) by mouth 2 (two) times daily.   Co Q 10 100 MG Caps Take 100 mg by mouth daily.   fluticasone 50  MCG/ACT nasal spray Commonly known as: FLONASE Place 2 sprays into both nostrils 2 (two) times daily.   folic acid A999333 MCG tablet Commonly known as: FOLVITE Take 1,200 mcg by mouth daily.   furosemide 20 MG tablet Commonly known as: LASIX Take 1 tablet (20 mg total) by mouth daily.   gabapentin 300 MG capsule Commonly known as: NEURONTIN Take 1 capsule (300 mg total) by mouth 3 (three) times daily.   hydroxychloroquine 200 MG tablet Commonly known as: PLAQUENIL Take 200 mg by mouth 2 (two) times daily.   methotrexate 250 MG/10ML injection Inject 0.8 mLs into the skin once a week.   metoprolol tartrate 25 MG tablet Commonly known as: LOPRESSOR Take 0.5 tablets (12.5 mg total) by mouth 2 (two) times daily.   multivitamin tablet Take 1 tablet by mouth daily.   Myrbetriq 50 MG Tb24 tablet Generic drug: mirabegron ER TAKE 1 TABLET(50 MG) BY MOUTH DAILY   polyethylene glycol powder 17 GM/SCOOP powder Commonly known as: GLYCOLAX/MIRALAX Take 17 g by mouth at bedtime.   pravastatin 20 MG tablet Commonly known as: PRAVACHOL Take 1 tablet (20 mg total) by mouth at bedtime.   Selenium 200 MCG Caps Take 200 mcg by mouth daily.   sodium chloride 1 g tablet Take 1 tablet (1 g total) by mouth 2 (two) times daily with a meal. What changed: when to take this   Turmeric 500 MG Caps Take 500 mg by mouth daily.   VITAMIN C PO Take by mouth.   Vitamin D3 125 MCG (5000 UT) Caps Take 5,000 Units by mouth daily.   Zinc 50 MG Tabs Take by mouth.       Allergies:  Allergies  Allergen Reactions  . Ciprofloxacin Nausea And Vomiting    Headache  . Citalopram Hydrobromide Other (See Comments)    "unknown"  . Clindamycin Nausea And Vomiting  . Lorazepam Other (See Comments)    Adverse reaction  . Paroxetine Nausea Only  . Ramipril Other (See Comments)    "unknown"  . Simvastatin   . Sulfa Antibiotics Other (See Comments)    Family History: Family History  Problem  Relation Age of Onset  . Heart disease Mother 71       heart failure  . Pneumonia Father 27       pnemonia  . Skin cancer Father   . Skin cancer Sister   . Skin cancer Son   . Colon cancer Neg Hx   . Esophageal cancer Neg Hx   . Stomach cancer Neg Hx   . Pancreatic cancer Neg Hx   . Liver disease Neg Hx     Social History:   reports that he has never  smoked. He has never used smokeless tobacco. He reports current alcohol use. He reports that he does not use drugs.  Physical Exam: BP 130/80   Pulse 74   Ht 6\' 2"  (1.88 m)   Wt 175 lb (79.4 kg)   BMI 22.47 kg/m   Constitutional:  Alert and oriented, no acute distress, nontoxic appearing HEENT: [place], AT Cardiovascular: No clubbing, cyanosis, or edema Respiratory: Normal respiratory effort, no increased work of breathing GU: Foley catheter in place draining [place] colored urine without visible clot material Skin: No rashes, bruises or suspicious lesions Neurologic: Grossly intact, no focal deficits, moving all 4 extremities Psychiatric: Normal mood and affect  Assessment & Plan:   1. Hematuria, gross Likely secondary to bladder irritation on Eliquis.  I elected to irrigate the patient's bladder at the bedside today.  See separate procedure note for details.  I counseled patient to hold Eliquis for 2 days.  He denies infective symptoms today.  Counseled patient to contact the office if he develops these, including fever, chills, nausea, vomiting, constant bladder pain, and flank pain.  He expressed understanding.  Return if symptoms worsen or fail to improve.  Debroah Loop, PA-C  [facility] [address] [place], [place] [address] [phone]

## 2020-02-11 NOTE — Progress Notes (Signed)
Bladder Irrigation  Due to gross hematuria patient is present today for a bladder irrigation. Patient was cleaned and prepped in a sterile fashion. 150 ml of sterile water was instilled and irrigated into the bladder with a 67ml [place] syringe through the catheter in place with return of light pink urine with <75ml of clot material.  After irrigation urine flow was noted clear in color.  Catheter was reattached to the leg bag for drainage. Patient tolerated well.   Performed by: Debroah Loop, PA-C

## 2020-02-14 ENCOUNTER — Ambulatory Visit (INDEPENDENT_AMBULATORY_CARE_PROVIDER_SITE_OTHER): Payer: Medicare PPO

## 2020-02-14 ENCOUNTER — Other Ambulatory Visit: Payer: Self-pay

## 2020-02-14 DIAGNOSIS — J4550 Severe persistent asthma, uncomplicated: Secondary | ICD-10-CM

## 2020-02-14 MED ORDER — OMALIZUMAB 75 MG/0.5ML ~~LOC~~ SOSY
75.0000 mg | PREFILLED_SYRINGE | Freq: Once | SUBCUTANEOUS | Status: AC
  Administered 2020-02-14: 75 mg via SUBCUTANEOUS

## 2020-02-14 MED ORDER — OMALIZUMAB 150 MG/ML ~~LOC~~ SOSY
300.0000 mg | PREFILLED_SYRINGE | Freq: Once | SUBCUTANEOUS | Status: AC
  Administered 2020-02-14: 16:00:00 300 mg via SUBCUTANEOUS

## 2020-02-14 NOTE — Progress Notes (Signed)
Have you been hospitalized within the last 10 days?  No Do you have a fever?  No Do you have a cough?  No Do you have a headache or sore throat? No Do you have your Epi Pen visible and is it within date?  Yes 

## 2020-02-18 ENCOUNTER — Ambulatory Visit: Payer: Self-pay | Admitting: Physician Assistant

## 2020-02-19 ENCOUNTER — Other Ambulatory Visit: Payer: Self-pay

## 2020-02-19 ENCOUNTER — Ambulatory Visit (INDEPENDENT_AMBULATORY_CARE_PROVIDER_SITE_OTHER): Payer: Medicare PPO | Admitting: Physician Assistant

## 2020-02-19 DIAGNOSIS — N319 Neuromuscular dysfunction of bladder, unspecified: Secondary | ICD-10-CM

## 2020-02-19 NOTE — Patient Instructions (Signed)
Percutaneous Nephrostomy  Percutaneous nephrostomy is a procedure to insert a flexible tube into your kidney so that urine can leave your body. This procedure may be done if a medical condition prevents urine from leaving your kidney in the usual way. Urine is normally carried from the kidneys to the bladder through narrow tubes called ureters. A ureter can become blocked because of conditions such as kidney stones, tumors, infection, or blood clots. The nephrostomy tube will be inserted through your back. After the procedure, the tube will remain in place, and urine will drain from the kidney into a drainage bag outside your body. Draining the urine will relieve pressure and help prevent infection that could damage the kidney. Often, this procedure allows your health care provider to identify the cause of the blockage and plan appropriate treatment. Tell a health care provider about:  Any allergies you have.  All medicines you are taking, including vitamins, herbs, eye drops, creams, and over-the-counter medicines.  Any problems you or family members have had with anesthetic medicines.  Any blood disorders you have.  Any surgeries you have had.  Any medical conditions you have.  Whether you are pregnant or may be pregnant. What are the risks? Generally, this is a safe procedure. However, problems may occur, including:  Infection.  Bleeding.  Allergic reactions to medicines or dyes used in the procedure.  Damage to other structures or organs. What happens before the procedure?  Follow instructions from your health care provider about eating or drinking restrictions.  Ask your health care provider about: ? Changing or stopping your regular medicines. This is especially important if you are taking diabetes medicines or blood thinners. ? Taking medicines such as aspirin and ibuprofen. These medicines can thin your blood. Do not take these medicines before your procedure if your health  care provider instructs you not to.  You may be given antibiotic medicine to help prevent infection.  You may have blood tests to see how well your kidneys and liver are working and to see how well your blood can clot.  Plan to have someone take you home from the hospital or clinic. What happens during the procedure?  To reduce your risk of infection: ? Your health care team will wash or sanitize their hands. ? Your skin will be washed with soap.  An IV tube will be inserted into one of your veins. You may be given medicines through this IV tube to help prevent nausea and pain.  You will be positioned on your abdomen.  You will be given one or more of the following: ? A medicine to help you relax (sedative). ? A medicine to numb the area (local anesthetic) where the nephrostomy tube will be inserted.  The nephrostomy tube, which is thin and flexible, will be inserted into a needle.  The needle will be inserted into your body and guided to your kidney. An imaging method that uses X-ray images (fluoroscopy) will be used to help guide the needle to the kidney.  A dye will be injected through the nephrostomy tube. Then, X-ray images that highlight your kidney will be taken.  Next, the needle will be removed, but the nephrostomy tube will be left in your kidney. The tube may be secured to your skin with stitches (sutures).  A drainage bag will be attached to the nephrostomy tube. Urine will be able to drain from your kidney to this drainage bag outside your body. The procedure may vary among health care providers  and hospitals. What happens after the procedure?  Your blood pressure, heart rate, breathing rate, and blood oxygen level will be monitored until the medicines you were given have worn off.  You will need to remain lying down for several hours.  You will be taught how to care for the nephrostomy tube and the drainage bag.  Donot drive for 24 hours if you were given a  sedative. This information is not intended to replace advice given to you by your health care provider. Make sure you discuss any questions you have with your health care provider. Document Revised: 10/27/2017 Document Reviewed: 08/26/2016 Elsevier Patient Education  2020 [employer].  Percutaneous Nephrostomy Home Guide Percutaneous nephrostomy is a procedure to insert a flexible tube into your kidney so that urine can leave your body. This procedure may be done if a medical condition prevents urine from leaving your kidney in the usual way. During the procedure, the nephrostomy tube is inserted in the right or left side of your lower back and is connected to an external drainage bag. After you have a nephrostomy tube placed, urine will collect in the drainage bag outside of your body. You will need to empty and change the drainage bag as needed. You will also need to take steps to care for the area where the nephrostomy tube was inserted (tube insertion site). How do I care for my nephrostomy tube?  Always keep your tubing, the leg bag, or the bedside drainage bag below the level of your kidney so that your urine drains freely.  Avoid activities that would cause bending or pulling of your tubing. Ask your health care provider what activities are safe for you.  When connecting your nephrostomy tube to a drainage bag, make sure that there are no kinks in the tubing and that your urine is draining freely. You may want to gently wrap an elastic bandage over the tubing. This will help keep the tubing in place and prevent it from kinking. Make sure there is no tension on the tubing so it does not become dislodged.  At night, you may want to connect your nephrostomy tube or the leg bag to a larger bedside drainage bag. How do I empty the drainage bag? Empty the leg bag or bedside drainage bag whenever it becomes ? full. Also empty it before you go to sleep. Most drainage bags have a drain at the  bottom that allows urine to be emptied. Follow these basic steps: 1. Hold the drainage bag over a toilet or collection container. Use a measuring container if your health care provider told you to measure your urine. 2. Open the drain of the bag and allow the urine to drain out. 3. After all the urine has drained from the drainage bag, close the drain fully. 4. Flush the urine down the toilet. If a collection container was used, rinse the container. How do I change the dressing around the nephrostomy tube? Change your dressing and clean your tube exit site as told by your health care provider. You may need to change the dressing every day for the first 2 weeks after having a nephrostomy tube inserted. After the first 2 weeks, you may be told to change the dressing two times a week. Supplies needed:  Mild soap and water.  Split gauze pads, 4  4 inches (10 x 10 cm).  Gauze pads, 4  4 inches (10 x 10 cm).  Paper tape. How to change the dressing: Because of  the location of your nephrostomy tube, you may need help from another person to complete dressing changes. Follow these basic steps: 1. Wash hands with soap and water. 2. Gently remove the tape and dressing from around the nephrostomy tube. Be careful not to pull on the tube while removing the dressing. Avoid using scissors because they may damage the tube. 3. Wash the skin around the tube with mild soap and water, rinse well, and pat the skin dry with a clean cloth. 4. Check the skin around the drain for redness, swelling, pus, warmth, or a bad smell. 5. If the drain was sutured to the skin, check the suture to verify that it is still anchored in the skin. 6. Place two split gauze pads in and around the tube exit site. Do not apply ointments or alcohol to the site. 7. Place a gauze pad on top of the split gauze pad. 8. Coil the tube on top of the gauze. The tubing should rest on the gauze, not on the skin. 9. Place tape around each edge  of the gauze pad. 10. Secure the nephrostomy tubing. Make sure that the tube does not kink or become pinched. The tubing should rest on the gauze pad, not on the skin. 11. Dispose of used supplies properly.  How do I flush my nephrostomy tube? Use a saline syringe to rinse out (flush) your nephrostomy tube as told by your health care provider. Flushing is easier if a three-way stopcock is placed between the tube and the drainage bag. One connection of the stopcock connects to your tube, the second connects to the drainage bag, and the third is usually covered with a cap. The lever on the stopcock points to the direction on the stopcock that is closed to flow. Normally, the lever points in the direction of the cap to allow urine to drain from the tube to the drainage bag. Supplies needed:  Rubbing alcohol wipe.  10 mL 0.9% saline syringe. How to flush the tube: 1. Move the lever of the three-way stopcock so it points toward the drainage bag. 2. Clean the cap with a rubbing alcohol wipe. 3. Screw the tip of a 10 mL 0.9% saline syringe onto the cap. 4. Using the syringe plunger, slowly push the 10 mL 0.9% saline in the syringe over 5-10 seconds. If resistance is met or pain occurs while pushing, stop pushing the saline. 5. Remove the syringe from the cap. 6. Return the stopcock lever to the usual position, pointing in the direction of the cap. 7. Dispose of used supplies properly. How do I replace the drainage bag? Replace the drainage bag, three-way stopcock, and any extension tubing as told by your health care provider. Make sure you always have an extra drainage bag and connecting tubing available. 1. Empty urine from your drainage bag. 2. Gather a new drainage bag, three-way stopcock, and any extension tubing. 3. Remove the drainage bag, three-way stopcock, and any extension tubing from the nephrostomy tube. 4. Attach the new leg bag or bedside drainage bag, three-way stopcock, and any  extension tubing to the nephrostomy tube. 5. Dispose of the used drainage bag, three-way stopcock, and any extension. Contact a health care provider if:  You have problems with any of the valves or tubing.  You have persistent pain or soreness in your back.  You have more redness, swelling, or pain around your tube insertion site.  You have more fluid or blood coming from your tube insertion site.  Your tube insertion site feels warm to the touch.  You have pus or a bad smell coming from your tube insertion site.  You have increased urine output or you feel burning when urinating. Get help right away if:  You have pain in your abdomen during the first week.  You have chest pain or have trouble breathing.  You have a new appearance of blood in your urine.  You have a fever or chills.  You have back pain that is not relieved by your medicine.  You have decreased urine output.  Your nephrostomy tube comes out. This information is not intended to replace advice given to you by your health care provider. Make sure you discuss any questions you have with your health care provider. Document Revised: 03/07/2019 Document Reviewed: 08/26/2016 Elsevier Patient Education  2020 [employer].

## 2020-02-19 NOTE — Progress Notes (Signed)
Cath Change/ Replacement  Patient is present today for a catheter change due to urinary retention.  106ml of water was removed from the balloon, a 16FR foley cath was removed without difficulty.  Patient was cleaned and prepped in a sterile fashion with betadine and 2% lidocaine jelly was instilled into the urethra. A 16 FR foley cath was replaced into the bladder no complications were noted Urine return was noted 73ml and urine was yellow in color. The balloon was filled with 63ml of sterile water.  Catheter was attached to the patient's leg bag.  He declined additional drainage bags.  Patient was given proper instruction on catheter care.    Performed by: Debroah Loop, PA-C and Fonnie Jarvis, CMA  Additional notes: Counseled patient that gross hematuria is a common finding on Eliquis.  I would like him to refrain from holding Eliquis moving forward, due to concerns for increased stroke risk with holding this medication.  I explained that as long as his catheter is draining, I am not concerned about gross hematuria.  Additionally, I offered him the option of switching from a Foley catheter to a nephrostomy tube for urinary management, which would decrease his bladder discomfort and possible gross hematuria.  He would like to defer this at this time.  Follow up: Return in about 4 weeks (around 03/18/2020) for Catheter exchange.

## 2020-02-25 DIAGNOSIS — D696 Thrombocytopenia, unspecified: Secondary | ICD-10-CM | POA: Diagnosis not present

## 2020-02-25 DIAGNOSIS — M25512 Pain in left shoulder: Secondary | ICD-10-CM | POA: Diagnosis not present

## 2020-02-25 DIAGNOSIS — M25511 Pain in right shoulder: Secondary | ICD-10-CM | POA: Diagnosis not present

## 2020-02-25 DIAGNOSIS — Z79899 Other long term (current) drug therapy: Secondary | ICD-10-CM | POA: Diagnosis not present

## 2020-02-25 DIAGNOSIS — G8929 Other chronic pain: Secondary | ICD-10-CM | POA: Diagnosis not present

## 2020-02-25 DIAGNOSIS — M0579 Rheumatoid arthritis with rheumatoid factor of multiple sites without organ or systems involvement: Secondary | ICD-10-CM | POA: Diagnosis not present

## 2020-03-02 ENCOUNTER — Ambulatory Visit: Payer: Medicare PPO

## 2020-03-04 ENCOUNTER — Ambulatory Visit (INDEPENDENT_AMBULATORY_CARE_PROVIDER_SITE_OTHER): Payer: Medicare PPO

## 2020-03-04 ENCOUNTER — Other Ambulatory Visit: Payer: Self-pay

## 2020-03-04 DIAGNOSIS — J4550 Severe persistent asthma, uncomplicated: Secondary | ICD-10-CM

## 2020-03-04 MED ORDER — OMALIZUMAB 75 MG/0.5ML ~~LOC~~ SOSY
75.0000 mg | PREFILLED_SYRINGE | Freq: Once | SUBCUTANEOUS | Status: AC
  Administered 2020-03-04: 75 mg via SUBCUTANEOUS

## 2020-03-04 MED ORDER — OMALIZUMAB 150 MG/ML ~~LOC~~ SOSY
300.0000 mg | PREFILLED_SYRINGE | Freq: Once | SUBCUTANEOUS | Status: AC
  Administered 2020-03-04: 300 mg via SUBCUTANEOUS

## 2020-03-04 NOTE — Progress Notes (Signed)
Have you been hospitalized within the last 10 days?  No Do you have a fever?  No Do you have a cough?  No Do you have a headache or sore throat? No Do you have your Epi Pen visible and is it within date?  Yes 

## 2020-03-10 ENCOUNTER — Telehealth: Payer: Self-pay | Admitting: Pulmonary Disease

## 2020-03-10 NOTE — Telephone Encounter (Signed)
Xolair Prefilled Syringe Order: 150mg  Prefilled Syringe:  #4 75mg  Prefilled Syringe: #2 Ordered Date: 03/10/2020 Expected date of arrival: 03/11/2020 Ordered by: Desmond Dike, [place]  Specialty [facility]: [facility]

## 2020-03-11 NOTE — Telephone Encounter (Signed)
Xolair Prefilled Syringe Received:  150mg  Prefilled Syringe >> quantity #4, lot # [id number], exp date 09/2020 75mg  Prefilled Syringe >> quantity #2, lot # [id number], exp date 07/2020 Medication arrival date: 03/11/2020 Received by: Desmond Dike, [place]

## 2020-03-18 ENCOUNTER — Encounter: Payer: Self-pay | Admitting: Physician Assistant

## 2020-03-18 ENCOUNTER — Other Ambulatory Visit: Payer: Self-pay

## 2020-03-18 ENCOUNTER — Ambulatory Visit: Payer: Medicare PPO | Admitting: Physician Assistant

## 2020-03-18 ENCOUNTER — Ambulatory Visit: Payer: Medicare PPO

## 2020-03-18 VITALS — BP 99/65 | HR 54 | Ht 72.0 in | Wt 191.0 lb

## 2020-03-18 DIAGNOSIS — N319 Neuromuscular dysfunction of bladder, unspecified: Secondary | ICD-10-CM

## 2020-03-18 NOTE — Progress Notes (Signed)
Cath Change/ Replacement  Patient is present today for a catheter change due to urinary retention.  39ml of water was removed from the balloon, a 16FR foley cath was removed without difficulty.  Patient was cleaned and prepped in a sterile fashion with betadine and 2% lidocaine jelly was instilled into the urethra. A 16 FR foley cath was replaced into the bladder no complications were noted Urine return was noted <83ml and urine was yellow in color. The balloon was filled with 68ml of sterile water and attached to the patient's leg bag.  Patient was given proper instruction on catheter care.    Performed by: Debroah Loop, PA-C   Follow up: Return in about 4 weeks (around 04/15/2020) for Catheter exchange.

## 2020-03-19 ENCOUNTER — Other Ambulatory Visit: Payer: Self-pay | Admitting: Internal Medicine

## 2020-03-19 DIAGNOSIS — G8929 Other chronic pain: Secondary | ICD-10-CM | POA: Diagnosis not present

## 2020-03-19 DIAGNOSIS — J449 Chronic obstructive pulmonary disease, unspecified: Secondary | ICD-10-CM | POA: Diagnosis not present

## 2020-03-19 DIAGNOSIS — I2510 Atherosclerotic heart disease of native coronary artery without angina pectoris: Secondary | ICD-10-CM | POA: Diagnosis not present

## 2020-03-19 DIAGNOSIS — M05712 Rheumatoid arthritis with rheumatoid factor of left shoulder without organ or systems involvement: Secondary | ICD-10-CM | POA: Diagnosis not present

## 2020-03-19 DIAGNOSIS — I4819 Other persistent atrial fibrillation: Secondary | ICD-10-CM | POA: Diagnosis not present

## 2020-03-19 DIAGNOSIS — M05711 Rheumatoid arthritis with rheumatoid factor of right shoulder without organ or systems involvement: Secondary | ICD-10-CM | POA: Diagnosis not present

## 2020-03-19 DIAGNOSIS — I10 Essential (primary) hypertension: Secondary | ICD-10-CM | POA: Diagnosis not present

## 2020-03-19 DIAGNOSIS — M05742 Rheumatoid arthritis with rheumatoid factor of left hand without organ or systems involvement: Secondary | ICD-10-CM | POA: Diagnosis not present

## 2020-03-19 DIAGNOSIS — M05741 Rheumatoid arthritis with rheumatoid factor of right hand without organ or systems involvement: Secondary | ICD-10-CM | POA: Diagnosis not present

## 2020-03-20 ENCOUNTER — Other Ambulatory Visit: Payer: Self-pay

## 2020-03-20 ENCOUNTER — Ambulatory Visit (INDEPENDENT_AMBULATORY_CARE_PROVIDER_SITE_OTHER): Payer: Medicare PPO

## 2020-03-20 DIAGNOSIS — J4550 Severe persistent asthma, uncomplicated: Secondary | ICD-10-CM

## 2020-03-20 MED ORDER — OMALIZUMAB 75 MG/0.5ML ~~LOC~~ SOSY
75.0000 mg | PREFILLED_SYRINGE | Freq: Once | SUBCUTANEOUS | Status: AC
  Administered 2020-03-20: 75 mg via SUBCUTANEOUS

## 2020-03-20 MED ORDER — OMALIZUMAB 150 MG/ML ~~LOC~~ SOSY
300.0000 mg | PREFILLED_SYRINGE | Freq: Once | SUBCUTANEOUS | Status: AC
  Administered 2020-03-20: 300 mg via SUBCUTANEOUS

## 2020-03-20 NOTE — Telephone Encounter (Signed)
Last filled 02-03-20 #90 Last OV 11-11-19 Next OV 05-11-20 [facility] [address]

## 2020-03-20 NOTE — Progress Notes (Signed)
All questions were answered by the patient before medication was administered. Have you been hospitalized in the last 10 days? No Do you have a fever? No Do you have a cough? No Do you have a headache or sore throat? No  

## 2020-03-23 DIAGNOSIS — I2510 Atherosclerotic heart disease of native coronary artery without angina pectoris: Secondary | ICD-10-CM | POA: Diagnosis not present

## 2020-03-23 DIAGNOSIS — M05741 Rheumatoid arthritis with rheumatoid factor of right hand without organ or systems involvement: Secondary | ICD-10-CM | POA: Diagnosis not present

## 2020-03-23 DIAGNOSIS — M05712 Rheumatoid arthritis with rheumatoid factor of left shoulder without organ or systems involvement: Secondary | ICD-10-CM | POA: Diagnosis not present

## 2020-03-23 DIAGNOSIS — I10 Essential (primary) hypertension: Secondary | ICD-10-CM | POA: Diagnosis not present

## 2020-03-23 DIAGNOSIS — M05742 Rheumatoid arthritis with rheumatoid factor of left hand without organ or systems involvement: Secondary | ICD-10-CM | POA: Diagnosis not present

## 2020-03-23 DIAGNOSIS — I4819 Other persistent atrial fibrillation: Secondary | ICD-10-CM | POA: Diagnosis not present

## 2020-03-23 DIAGNOSIS — M05711 Rheumatoid arthritis with rheumatoid factor of right shoulder without organ or systems involvement: Secondary | ICD-10-CM | POA: Diagnosis not present

## 2020-03-23 DIAGNOSIS — J449 Chronic obstructive pulmonary disease, unspecified: Secondary | ICD-10-CM | POA: Diagnosis not present

## 2020-03-23 DIAGNOSIS — G8929 Other chronic pain: Secondary | ICD-10-CM | POA: Diagnosis not present

## 2020-03-26 DIAGNOSIS — G8929 Other chronic pain: Secondary | ICD-10-CM | POA: Diagnosis not present

## 2020-03-26 DIAGNOSIS — I10 Essential (primary) hypertension: Secondary | ICD-10-CM | POA: Diagnosis not present

## 2020-03-26 DIAGNOSIS — M05741 Rheumatoid arthritis with rheumatoid factor of right hand without organ or systems involvement: Secondary | ICD-10-CM | POA: Diagnosis not present

## 2020-03-26 DIAGNOSIS — I4819 Other persistent atrial fibrillation: Secondary | ICD-10-CM | POA: Diagnosis not present

## 2020-03-26 DIAGNOSIS — J449 Chronic obstructive pulmonary disease, unspecified: Secondary | ICD-10-CM | POA: Diagnosis not present

## 2020-03-26 DIAGNOSIS — I2510 Atherosclerotic heart disease of native coronary artery without angina pectoris: Secondary | ICD-10-CM | POA: Diagnosis not present

## 2020-03-26 DIAGNOSIS — M05711 Rheumatoid arthritis with rheumatoid factor of right shoulder without organ or systems involvement: Secondary | ICD-10-CM | POA: Diagnosis not present

## 2020-03-26 DIAGNOSIS — M05712 Rheumatoid arthritis with rheumatoid factor of left shoulder without organ or systems involvement: Secondary | ICD-10-CM | POA: Diagnosis not present

## 2020-03-26 DIAGNOSIS — M05742 Rheumatoid arthritis with rheumatoid factor of left hand without organ or systems involvement: Secondary | ICD-10-CM | POA: Diagnosis not present

## 2020-03-31 DIAGNOSIS — G8929 Other chronic pain: Secondary | ICD-10-CM | POA: Diagnosis not present

## 2020-03-31 DIAGNOSIS — I2510 Atherosclerotic heart disease of native coronary artery without angina pectoris: Secondary | ICD-10-CM | POA: Diagnosis not present

## 2020-03-31 DIAGNOSIS — M05741 Rheumatoid arthritis with rheumatoid factor of right hand without organ or systems involvement: Secondary | ICD-10-CM | POA: Diagnosis not present

## 2020-03-31 DIAGNOSIS — I10 Essential (primary) hypertension: Secondary | ICD-10-CM | POA: Diagnosis not present

## 2020-03-31 DIAGNOSIS — J449 Chronic obstructive pulmonary disease, unspecified: Secondary | ICD-10-CM | POA: Diagnosis not present

## 2020-03-31 DIAGNOSIS — M05711 Rheumatoid arthritis with rheumatoid factor of right shoulder without organ or systems involvement: Secondary | ICD-10-CM | POA: Diagnosis not present

## 2020-03-31 DIAGNOSIS — M05742 Rheumatoid arthritis with rheumatoid factor of left hand without organ or systems involvement: Secondary | ICD-10-CM | POA: Diagnosis not present

## 2020-03-31 DIAGNOSIS — I4819 Other persistent atrial fibrillation: Secondary | ICD-10-CM | POA: Diagnosis not present

## 2020-03-31 DIAGNOSIS — M05712 Rheumatoid arthritis with rheumatoid factor of left shoulder without organ or systems involvement: Secondary | ICD-10-CM | POA: Diagnosis not present

## 2020-04-02 ENCOUNTER — Other Ambulatory Visit: Payer: Self-pay

## 2020-04-02 ENCOUNTER — Telehealth: Payer: Self-pay

## 2020-04-02 ENCOUNTER — Ambulatory Visit (INDEPENDENT_AMBULATORY_CARE_PROVIDER_SITE_OTHER): Payer: Medicare PPO | Admitting: Internal Medicine

## 2020-04-02 ENCOUNTER — Encounter: Payer: Self-pay | Admitting: Internal Medicine

## 2020-04-02 VITALS — BP 122/84 | HR 64 | Temp 97.6°F | Ht 72.0 in | Wt 193.0 lb

## 2020-04-02 DIAGNOSIS — R079 Chest pain, unspecified: Secondary | ICD-10-CM

## 2020-04-02 DIAGNOSIS — R071 Chest pain on breathing: Secondary | ICD-10-CM | POA: Diagnosis not present

## 2020-04-02 NOTE — Progress Notes (Signed)
Subjective:    Patient ID: [name], male    DOB: 11-02-1934, 86 y.o.   MRN: [id number]  HPI Here with daughter due to concern about chest pain This visit occurred during the SARS-CoV-2 public health emergency.  Safety protocols were in place, including screening questions prior to the visit, additional usage of staff PPE, and extensive cleaning of exam room while observing appropriate contact time as indicated for disinfecting solutions.   Noticed some discomfort over sternum while in bed "taking breaths" Then it seemed to go up to left shoulder and upper left chest (once he got up and started moving) No radiation to left arm Had been sore due to exercises with therapist (had been sore before starting yesterday and then still did it and he noticed more pain even before going to bed) Quad strengthening/walking---working on posture with neck extension No upper body work separately  Pain still there now--though improved somewhat Does take tylenol--no separate Rx otherwise  No cough He now notes more pain if flexes neck completely (and hunches shoulders) Achy type pain  Current Outpatient Medications on File Prior to Visit  Medication Sig Dispense Refill  . acetaminophen (TYLENOL) 500 MG tablet Take 500 mg by mouth every 8 (eight) hours as needed for moderate pain or headache.     . albuterol (PROVENTIL HFA;VENTOLIN HFA) 108 (90 Base) MCG/ACT inhaler Inhale 2 puffs into the lungs every 6 (six) hours as needed for wheezing or shortness of breath. 1 Inhaler 3  . ALOE PO Take by mouth.    . ALPRAZolam (XANAX) 0.5 MG tablet TAKE 1 TABLET BY MOUTH THREE TIMES DAILY AS NEEDED FOR ANXIETY OR SLEEP 90 tablet 0  . apixaban (ELIQUIS) 5 MG TABS tablet Take 1 tablet (5 mg total) by mouth 2 (two) times daily. 60 tablet 6  . Ascorbic Acid (VITAMIN C PO) Take by mouth.    . Cholecalciferol (VITAMIN D3) 5000 UNITS CAPS Take 5,000 Units by mouth daily.     . Coenzyme Q10 (CO Q 10) 100 MG CAPS Take  100 mg by mouth daily.     . fluticasone (FLONASE) 50 MCG/ACT nasal spray Place 2 sprays into both nostrils 2 (two) times daily. 48 g 3  . folic acid (FOLVITE) A999333 MCG tablet Take 1,200 mcg by mouth daily.    . furosemide (LASIX) 20 MG tablet Take 1 tablet (20 mg total) by mouth daily. 90 tablet 1  . gabapentin (NEURONTIN) 300 MG capsule Take 1 capsule (300 mg total) by mouth 3 (three) times daily. 90 capsule 11  . hydroxychloroquine (PLAQUENIL) 200 MG tablet Take 200 mg by mouth 2 (two) times daily.     . methotrexate 250 MG/10ML injection Inject 0.8 mLs into the skin once a week.    . Multiple Vitamin (MULTIVITAMIN) tablet Take 1 tablet by mouth daily.      Marland Kitchen MYRBETRIQ 50 MG TB24 tablet TAKE 1 TABLET(50 MG) BY MOUTH DAILY 30 tablet 3  . OIL OF OREGANO PO Take by mouth.    . polyethylene glycol powder (GLYCOLAX/MIRALAX) 17 GM/SCOOP powder Take 17 g by mouth at bedtime.     . pravastatin (PRAVACHOL) 20 MG tablet Take 1 tablet (20 mg total) by mouth at bedtime. 90 tablet 3  . Probiotic Product (ALIGN) 4 MG CAPS Take 1 capsule (4 mg total) by mouth daily. 15 capsule 0  . Selenium 200 MCG CAPS Take 200 mcg by mouth daily.     . sodium chloride 1 g  tablet Take 1 tablet (1 g total) by mouth 2 (two) times daily with a meal. (Patient taking differently: Take 1 g by mouth 3 (three) times daily with meals. ) 60 tablet 1  . Turmeric 500 MG CAPS Take 500 mg by mouth daily.     . Zinc 50 MG TABS Take by mouth.    . metoprolol tartrate (LOPRESSOR) 25 MG tablet Take 0.5 tablets (12.5 mg total) by mouth 2 (two) times daily. 180 tablet 3   Current Facility-Administered Medications on File Prior to Visit  Medication Dose Route Frequency Provider Last Rate Last Admin  . omalizumab Arvid Right) prefilled syringe 300 mg  300 mg Subcutaneous Once Chesley Mires, MD      . omalizumab Arvid Right) prefilled syringe 75 mg  75 mg Subcutaneous Once Chesley Mires, MD        Allergies  Allergen Reactions  . Ciprofloxacin Nausea  And Vomiting    Headache  . Citalopram Hydrobromide Other (See Comments)    "unknown"  . Clindamycin Nausea And Vomiting  . Lorazepam Other (See Comments)    Adverse reaction  . Paroxetine Nausea Only  . Ramipril Other (See Comments)    "unknown"  . Simvastatin   . Sulfa Antibiotics Other (See Comments)    Past Medical History:  Diagnosis Date  . (HFpEF) heart failure with preserved ejection fraction ([place])    a. 02/2019 Echo: Ef 60-65%, mildly reduced RV fxn. RVSP 46mmHg. Mildly dil LA. Mod dil RA. Mild to mod TR. Mild to mod AS. Triv AI.  Marland Kitchen Allergy   . Anemia   . Anxiety   . Arthritis    rheumatoid  . Asthma   . Basal cell carcinoma    back  . CAD (coronary artery disease)   . CHF (congestive heart failure) ([place])   . Chronic kidney disease    Mass right kidney  . Chronic sinusitis   . Collagen vascular disease (HCC)    Rheumatoid Arthritis  . Depression   . Diverticulitis   . Dysrhythmia   . ED (erectile dysfunction)   . GERD (gastroesophageal reflux disease)   . History of SIADH   . Hx of adenomatous colonic polyps   . Hypertension   . Hyponatremia   . IBS (irritable bowel syndrome)   . Neurodermatitis   . Neurogenic bladder   . OSA (obstructive sleep apnea)    no CPAP since weight loss  . Persistent atrial fibrillation ([place])    a. Dx 02/2019; b. CHA2DS2VASc = 6-->eliquis initiated.  . Prostate cancer ([place])   . Renal mass    10/2018 4.2cm R renal cortical mass. Most compatible w/ clear cell renal cell carcinoma.    Past Surgical History:  Procedure Laterality Date  . CARDIOVERSION N/A 04/23/2019   Procedure: CARDIOVERSION;  Surgeon: Nelva Bush, MD;  Location: [facility] ORS;  Service: Cardiovascular;  Laterality: N/A;  . KNEE ARTHROPLASTY Right 09/02/2015   Procedure: COMPUTER ASSISTED TOTAL KNEE ARTHROPLASTY;  Surgeon: [name], MD;  Location: [facility] ORS;  Service: Orthopedics;  Laterality: Right;  . LAPAROSCOPIC NEPHRECTOMY, HAND ASSISTED Right  06/17/2019   Procedure: HAND ASSISTED LAPAROSCOPIC NEPHRECTOMY;  Surgeon: Hollice Espy, MD;  Location: [facility] ORS;  Service: Urology;  Laterality: Right;  . NASAL SINUS SURGERY  2009   DEVIATED SEPTUM AND POLYPS  . permanent indwelling catheter    . PROSTATE SURGERY     PROSTATECTOMY  . TOTAL KNEE ARTHROPLASTY Left 9/15   Dr [name]  . URETHRAL STRICTURE DILATATION  02-2010   Dr.Cope    Family History  Problem Relation Age of Onset  . Heart disease Mother 68       heart failure  . Pneumonia Father 91       pnemonia  . Skin cancer Father   . Skin cancer Sister   . Skin cancer Son   . Colon cancer Neg Hx   . Esophageal cancer Neg Hx   . Stomach cancer Neg Hx   . Pancreatic cancer Neg Hx   . Liver disease Neg Hx     Social History   Socioeconomic History  . Marital status: Married    Spouse name: Not on file  . Number of children: 2  . Years of education: Not on file  . Highest education level: Not on file  Occupational History  . Occupation: Radio Licensed conveyancer    Comment: retired  Tobacco Use  . Smoking status: Never Smoker  . Smokeless tobacco: Never Used  Substance and Sexual Activity  . Alcohol use: Yes    Comment: 5 days out of 7 drinks gin   . Drug use: No  . Sexual activity: Not Currently  Other Topics Concern  . Not on file  Social History Narrative   Not sure about a living will or health care POA   Wife should make health care decisions for him--then daughter, then son   Would accept resuscitation attempts   Not sure about tube feeds   Social Determinants of Health   Financial Resource Strain:   . Difficulty of Paying Living Expenses:   Food Insecurity:   . Worried About Charity fundraiser in the Last Year:   . Arboriculturist in the Last Year:   Transportation Needs:   . Film/video editor (Medical):   Marland Kitchen Lack of Transportation (Non-Medical):   Physical Activity:   . Days of Exercise per Week:   . Minutes of Exercise per Session:     Stress:   . Feeling of Stress :   Social Connections:   . Frequency of Communication with Friends and Family:   . Frequency of Social Gatherings with Friends and Family:   . Attends Religious Services:   . Active Member of Clubs or Organizations:   . Attends Archivist Meetings:   Marland Kitchen Marital Status:   Intimate Partner Violence:   . Fear of Current or Ex-Partner:   . Emotionally Abused:   Marland Kitchen Physically Abused:   . Sexually Abused:    Review of Systems No fever No N/V Appetite is fine    Objective:   Physical Exam  Constitutional: He appears well-developed. No distress.  Neck: No thyromegaly present.  Cardiovascular: Normal rate, regular rhythm and normal heart sounds. Exam reveals no gallop and no friction rub.  No murmur heard. Respiratory: Effort normal and breath sounds normal. No respiratory distress. He has no wheezes. He has no rales.  No tenderness over sternum--but is tender over the left costochondral cartilage  Musculoskeletal:        General: No edema.  Lymphadenopathy:    He has no cervical adenopathy.  Psychiatric: He has a normal mood and affect. His behavior is normal.           Assessment & Plan:

## 2020-04-02 NOTE — Telephone Encounter (Signed)
Here now so I will assess the situation

## 2020-04-02 NOTE — Telephone Encounter (Signed)
[name] pts daughter left v/m pt was hurting in chest; pt had been exercising on 03/31/20 with PT & on his own 04/01/20 and no SOB. I spoke with [name] and pt; this morning pt had dull continuous CP in both lt and rt side of chest but lt side hurts worse, if pt takes deep breath chest hurts worse and pt said pain does radiate from chest to lt shoulder but pt has bad arthritis in lt shoulder. No SOB or difficulty breathing. No H/a or dizziness. Pt has no covid symptoms, no travel and no known exposure to + covid. No other providers could see pt today and Pt will see Dr Silvio Pate today after 4:15 and will go to UC if condition changes or worsens piror to appt. [name] voiced understanding and was appreciative.

## 2020-04-02 NOTE — Assessment & Plan Note (Signed)
History suggests a musculoskeletal cause and exam supports costochondritis as the cause Doesn't sound ischemic EKG shows sinus at 67. LAHB/RBBB. No ischemic changes ---no change since 01/06/20  Reassured Seems benign and should be self limited Discussed heat/ice depending on status

## 2020-04-03 DIAGNOSIS — J449 Chronic obstructive pulmonary disease, unspecified: Secondary | ICD-10-CM | POA: Diagnosis not present

## 2020-04-03 DIAGNOSIS — I4819 Other persistent atrial fibrillation: Secondary | ICD-10-CM | POA: Diagnosis not present

## 2020-04-03 DIAGNOSIS — M05741 Rheumatoid arthritis with rheumatoid factor of right hand without organ or systems involvement: Secondary | ICD-10-CM | POA: Diagnosis not present

## 2020-04-03 DIAGNOSIS — M05711 Rheumatoid arthritis with rheumatoid factor of right shoulder without organ or systems involvement: Secondary | ICD-10-CM | POA: Diagnosis not present

## 2020-04-03 DIAGNOSIS — I10 Essential (primary) hypertension: Secondary | ICD-10-CM | POA: Diagnosis not present

## 2020-04-03 DIAGNOSIS — M05742 Rheumatoid arthritis with rheumatoid factor of left hand without organ or systems involvement: Secondary | ICD-10-CM | POA: Diagnosis not present

## 2020-04-03 DIAGNOSIS — M05712 Rheumatoid arthritis with rheumatoid factor of left shoulder without organ or systems involvement: Secondary | ICD-10-CM | POA: Diagnosis not present

## 2020-04-03 DIAGNOSIS — I2510 Atherosclerotic heart disease of native coronary artery without angina pectoris: Secondary | ICD-10-CM | POA: Diagnosis not present

## 2020-04-03 DIAGNOSIS — G8929 Other chronic pain: Secondary | ICD-10-CM | POA: Diagnosis not present

## 2020-04-06 ENCOUNTER — Other Ambulatory Visit: Payer: Self-pay | Admitting: *Deleted

## 2020-04-06 DIAGNOSIS — M05712 Rheumatoid arthritis with rheumatoid factor of left shoulder without organ or systems involvement: Secondary | ICD-10-CM | POA: Diagnosis not present

## 2020-04-06 DIAGNOSIS — I2510 Atherosclerotic heart disease of native coronary artery without angina pectoris: Secondary | ICD-10-CM | POA: Diagnosis not present

## 2020-04-06 DIAGNOSIS — M05742 Rheumatoid arthritis with rheumatoid factor of left hand without organ or systems involvement: Secondary | ICD-10-CM | POA: Diagnosis not present

## 2020-04-06 DIAGNOSIS — J449 Chronic obstructive pulmonary disease, unspecified: Secondary | ICD-10-CM | POA: Diagnosis not present

## 2020-04-06 DIAGNOSIS — I4819 Other persistent atrial fibrillation: Secondary | ICD-10-CM | POA: Diagnosis not present

## 2020-04-06 DIAGNOSIS — G8929 Other chronic pain: Secondary | ICD-10-CM | POA: Diagnosis not present

## 2020-04-06 DIAGNOSIS — I10 Essential (primary) hypertension: Secondary | ICD-10-CM | POA: Diagnosis not present

## 2020-04-06 DIAGNOSIS — M05741 Rheumatoid arthritis with rheumatoid factor of right hand without organ or systems involvement: Secondary | ICD-10-CM | POA: Diagnosis not present

## 2020-04-06 DIAGNOSIS — M05711 Rheumatoid arthritis with rheumatoid factor of right shoulder without organ or systems involvement: Secondary | ICD-10-CM | POA: Diagnosis not present

## 2020-04-06 MED ORDER — METOPROLOL TARTRATE 25 MG PO TABS: 12.5000 mg | ORAL_TABLET | Freq: Two times a day (BID) | ORAL | 3 refills | Status: DC

## 2020-04-06 NOTE — Telephone Encounter (Signed)
Please advise if ok to refill Metoprolol Tartrate 25 mg tablet 0.5 mg tablet BID. Pt mentioned if he were to take a full tablet he wouldn't be able to get out of bed so he takes only 1/2 tablet bid. Pt requesting 90 day refill last filled By Dr. Louis Meckel.

## 2020-04-07 ENCOUNTER — Ambulatory Visit (INDEPENDENT_AMBULATORY_CARE_PROVIDER_SITE_OTHER): Payer: Medicare PPO

## 2020-04-07 ENCOUNTER — Other Ambulatory Visit: Payer: Self-pay

## 2020-04-07 ENCOUNTER — Ambulatory Visit: Payer: Medicare PPO | Admitting: Internal Medicine

## 2020-04-07 DIAGNOSIS — J4550 Severe persistent asthma, uncomplicated: Secondary | ICD-10-CM | POA: Diagnosis not present

## 2020-04-07 MED ORDER — OMALIZUMAB 75 MG/0.5ML ~~LOC~~ SOSY
75.0000 mg | PREFILLED_SYRINGE | Freq: Once | SUBCUTANEOUS | Status: AC
  Administered 2020-04-07: 16:00:00 75 mg via SUBCUTANEOUS

## 2020-04-07 MED ORDER — OMALIZUMAB 150 MG/ML ~~LOC~~ SOSY
300.0000 mg | PREFILLED_SYRINGE | Freq: Once | SUBCUTANEOUS | Status: AC
  Administered 2020-04-07: 300 mg via SUBCUTANEOUS

## 2020-04-07 NOTE — Progress Notes (Signed)
Have you been hospitalized within the last 10 days?  No Do you have a fever?  No Do you have a cough?  No Do you have a headache or sore throat? No  

## 2020-04-09 DIAGNOSIS — M05742 Rheumatoid arthritis with rheumatoid factor of left hand without organ or systems involvement: Secondary | ICD-10-CM | POA: Diagnosis not present

## 2020-04-09 DIAGNOSIS — J449 Chronic obstructive pulmonary disease, unspecified: Secondary | ICD-10-CM | POA: Diagnosis not present

## 2020-04-09 DIAGNOSIS — M05711 Rheumatoid arthritis with rheumatoid factor of right shoulder without organ or systems involvement: Secondary | ICD-10-CM | POA: Diagnosis not present

## 2020-04-09 DIAGNOSIS — M05741 Rheumatoid arthritis with rheumatoid factor of right hand without organ or systems involvement: Secondary | ICD-10-CM | POA: Diagnosis not present

## 2020-04-09 DIAGNOSIS — M05712 Rheumatoid arthritis with rheumatoid factor of left shoulder without organ or systems involvement: Secondary | ICD-10-CM | POA: Diagnosis not present

## 2020-04-09 DIAGNOSIS — G8929 Other chronic pain: Secondary | ICD-10-CM | POA: Diagnosis not present

## 2020-04-09 DIAGNOSIS — I10 Essential (primary) hypertension: Secondary | ICD-10-CM | POA: Diagnosis not present

## 2020-04-09 DIAGNOSIS — I2510 Atherosclerotic heart disease of native coronary artery without angina pectoris: Secondary | ICD-10-CM | POA: Diagnosis not present

## 2020-04-09 DIAGNOSIS — I4819 Other persistent atrial fibrillation: Secondary | ICD-10-CM | POA: Diagnosis not present

## 2020-04-13 ENCOUNTER — Telehealth: Payer: Self-pay | Admitting: Pulmonary Disease

## 2020-04-13 DIAGNOSIS — M05711 Rheumatoid arthritis with rheumatoid factor of right shoulder without organ or systems involvement: Secondary | ICD-10-CM | POA: Diagnosis not present

## 2020-04-13 DIAGNOSIS — J449 Chronic obstructive pulmonary disease, unspecified: Secondary | ICD-10-CM | POA: Diagnosis not present

## 2020-04-13 DIAGNOSIS — I2510 Atherosclerotic heart disease of native coronary artery without angina pectoris: Secondary | ICD-10-CM | POA: Diagnosis not present

## 2020-04-13 DIAGNOSIS — M05712 Rheumatoid arthritis with rheumatoid factor of left shoulder without organ or systems involvement: Secondary | ICD-10-CM | POA: Diagnosis not present

## 2020-04-13 DIAGNOSIS — M05741 Rheumatoid arthritis with rheumatoid factor of right hand without organ or systems involvement: Secondary | ICD-10-CM | POA: Diagnosis not present

## 2020-04-13 DIAGNOSIS — I10 Essential (primary) hypertension: Secondary | ICD-10-CM | POA: Diagnosis not present

## 2020-04-13 DIAGNOSIS — I4819 Other persistent atrial fibrillation: Secondary | ICD-10-CM | POA: Diagnosis not present

## 2020-04-13 DIAGNOSIS — M05742 Rheumatoid arthritis with rheumatoid factor of left hand without organ or systems involvement: Secondary | ICD-10-CM | POA: Diagnosis not present

## 2020-04-13 DIAGNOSIS — G8929 Other chronic pain: Secondary | ICD-10-CM | POA: Diagnosis not present

## 2020-04-13 NOTE — Telephone Encounter (Signed)
Xolair Prefilled Syringe Order: 150mg  Prefilled Syringe:  #4 75mg  Prefilled Syringe: #2 Ordered Date: 04/13/20 Expected date of arrival: 04/14/20 Ordered by: [place]: Nigel Mormon

## 2020-04-14 NOTE — Telephone Encounter (Signed)
Xolair Prefilled Syringe Received:  150mg  Prefilled Syringe >> quantity #4, lot # [id number], exp date 12/2020 75mg  Prefilled Syringe >> quantity #2, lot # [id number], exp date 09/2020 Medication arrival date: 04/14/2020 Received by: Desmond Dike, [place]

## 2020-04-15 DIAGNOSIS — H40003 Preglaucoma, unspecified, bilateral: Secondary | ICD-10-CM | POA: Diagnosis not present

## 2020-04-17 ENCOUNTER — Ambulatory Visit (INDEPENDENT_AMBULATORY_CARE_PROVIDER_SITE_OTHER): Payer: Medicare PPO | Admitting: Physician Assistant

## 2020-04-17 ENCOUNTER — Other Ambulatory Visit: Payer: Self-pay

## 2020-04-17 DIAGNOSIS — N319 Neuromuscular dysfunction of bladder, unspecified: Secondary | ICD-10-CM

## 2020-04-17 NOTE — Progress Notes (Signed)
Cath Change/ Replacement  Patient is present today for a catheter change due to urinary retention.  74ml of water was removed from the balloon, a 16FR foley cath was removed with out difficulty.  Patient was cleaned and prepped in a sterile fashion with betadine. A 16 FR foley cath was replaced into the bladder no complications were noted Urine return was noted 64ml and urine was yellow in color. The balloon was filled with 93ml of sterile water. A leg bag was attached for drainage.  . Patient was given proper instruction on catheter care.    Performed by: Fonnie Jarvis, CMA  Follow up: 1 mo

## 2020-04-17 NOTE — Patient Instructions (Signed)
Percutaneous Nephrostomy  Percutaneous nephrostomy is a procedure to insert a flexible tube into your kidney so that urine can leave your body. This procedure may be done if a medical condition prevents urine from leaving your kidney in the usual way. Urine is normally carried from the kidneys to the bladder through narrow tubes called ureters. A ureter can become blocked because of conditions such as kidney stones, tumors, infection, or blood clots. The nephrostomy tube will be inserted through your back. After the procedure, the tube will remain in place, and urine will drain from the kidney into a drainage bag outside your body. Draining the urine will relieve pressure and help prevent infection that could damage the kidney. Often, this procedure allows your health care provider to identify the cause of the blockage and plan appropriate treatment. Tell a health care provider about:  Any allergies you have.  All medicines you are taking, including vitamins, herbs, eye drops, creams, and over-the-counter medicines.  Any problems you or family members have had with anesthetic medicines.  Any blood disorders you have.  Any surgeries you have had.  Any medical conditions you have.  Whether you are pregnant or may be pregnant. What are the risks? Generally, this is a safe procedure. However, problems may occur, including:  Infection.  Bleeding.  Allergic reactions to medicines or dyes used in the procedure.  Damage to other structures or organs. What happens before the procedure?  Follow instructions from your health care provider about eating or drinking restrictions.  Ask your health care provider about: ? Changing or stopping your regular medicines. This is especially important if you are taking diabetes medicines or blood thinners. ? Taking medicines such as aspirin and ibuprofen. These medicines can thin your blood. Do not take these medicines before your procedure if your health  care provider instructs you not to.  You may be given antibiotic medicine to help prevent infection.  You may have blood tests to see how well your kidneys and liver are working and to see how well your blood can clot.  Plan to have someone take you home from the hospital or clinic. What happens during the procedure?  To reduce your risk of infection: ? Your health care team will wash or sanitize their hands. ? Your skin will be washed with soap.  An IV tube will be inserted into one of your veins. You may be given medicines through this IV tube to help prevent nausea and pain.  You will be positioned on your abdomen.  You will be given one or more of the following: ? A medicine to help you relax (sedative). ? A medicine to numb the area (local anesthetic) where the nephrostomy tube will be inserted.  The nephrostomy tube, which is thin and flexible, will be inserted into a needle.  The needle will be inserted into your body and guided to your kidney. An imaging method that uses X-ray images (fluoroscopy) will be used to help guide the needle to the kidney.  A dye will be injected through the nephrostomy tube. Then, X-ray images that highlight your kidney will be taken.  Next, the needle will be removed, but the nephrostomy tube will be left in your kidney. The tube may be secured to your skin with stitches (sutures).  A drainage bag will be attached to the nephrostomy tube. Urine will be able to drain from your kidney to this drainage bag outside your body. The procedure may vary among health care providers  and hospitals. What happens after the procedure?  Your blood pressure, heart rate, breathing rate, and blood oxygen level will be monitored until the medicines you were given have worn off.  You will need to remain lying down for several hours.  You will be taught how to care for the nephrostomy tube and the drainage bag.  Donot drive for 24 hours if you were given a  sedative. This information is not intended to replace advice given to you by your health care provider. Make sure you discuss any questions you have with your health care provider. Document Revised: 10/27/2017 Document Reviewed: 08/26/2016 Elsevier Patient Education  2020 [place].    Percutaneous Nephrostomy Home Guide Percutaneous nephrostomy is a procedure to insert a flexible tube into your kidney so that urine can leave your body. This procedure may be done if a medical condition prevents urine from leaving your kidney in the usual way. During the procedure, the nephrostomy tube is inserted in the right or left side of your lower back and is connected to an external drainage bag. After you have a nephrostomy tube placed, urine will collect in the drainage bag outside of your body. You will need to empty and change the drainage bag as needed. You will also need to take steps to care for the area where the nephrostomy tube was inserted (tube insertion site). How do I care for my nephrostomy tube?  Always keep your tubing, the leg bag, or the bedside drainage bag below the level of your kidney so that your urine drains freely.  Avoid activities that would cause bending or pulling of your tubing. Ask your health care provider what activities are safe for you.  When connecting your nephrostomy tube to a drainage bag, make sure that there are no kinks in the tubing and that your urine is draining freely. You may want to gently wrap an elastic bandage over the tubing. This will help keep the tubing in place and prevent it from kinking. Make sure there is no tension on the tubing so it does not become dislodged.  At night, you may want to connect your nephrostomy tube or the leg bag to a larger bedside drainage bag. How do I empty the drainage bag? Empty the leg bag or bedside drainage bag whenever it becomes ? full. Also empty it before you go to sleep. Most drainage bags have a drain at the  bottom that allows urine to be emptied. Follow these basic steps: 1. Hold the drainage bag over a toilet or collection container. Use a measuring container if your health care provider told you to measure your urine. 2. Open the drain of the bag and allow the urine to drain out. 3. After all the urine has drained from the drainage bag, close the drain fully. 4. Flush the urine down the toilet. If a collection container was used, rinse the container. How do I change the dressing around the nephrostomy tube? Change your dressing and clean your tube exit site as told by your health care provider. You may need to change the dressing every day for the first 2 weeks after having a nephrostomy tube inserted. After the first 2 weeks, you may be told to change the dressing two times a week. Supplies needed:  Mild soap and water.  Split gauze pads, 4  4 inches (10 x 10 cm).  Gauze pads, 4  4 inches (10 x 10 cm).  Paper tape. How to change the dressing:  Because of the location of your nephrostomy tube, you may need help from another person to complete dressing changes. Follow these basic steps: 1. Wash hands with soap and water. 2. Gently remove the tape and dressing from around the nephrostomy tube. Be careful not to pull on the tube while removing the dressing. Avoid using scissors because they may damage the tube. 3. Wash the skin around the tube with mild soap and water, rinse well, and pat the skin dry with a clean cloth. 4. Check the skin around the drain for redness, swelling, pus, warmth, or a bad smell. 5. If the drain was sutured to the skin, check the suture to verify that it is still anchored in the skin. 6. Place two split gauze pads in and around the tube exit site. Do not apply ointments or alcohol to the site. 7. Place a gauze pad on top of the split gauze pad. 8. Coil the tube on top of the gauze. The tubing should rest on the gauze, not on the skin. 9. Place tape around each edge  of the gauze pad. 10. Secure the nephrostomy tubing. Make sure that the tube does not kink or become pinched. The tubing should rest on the gauze pad, not on the skin. 11. Dispose of used supplies properly.  How do I flush my nephrostomy tube? Use a saline syringe to rinse out (flush) your nephrostomy tube as told by your health care provider. Flushing is easier if a three-way stopcock is placed between the tube and the drainage bag. One connection of the stopcock connects to your tube, the second connects to the drainage bag, and the third is usually covered with a cap. The lever on the stopcock points to the direction on the stopcock that is closed to flow. Normally, the lever points in the direction of the cap to allow urine to drain from the tube to the drainage bag. Supplies needed:  Rubbing alcohol wipe.  10 mL 0.9% saline syringe. How to flush the tube: 1. Move the lever of the three-way stopcock so it points toward the drainage bag. 2. Clean the cap with a rubbing alcohol wipe. 3. Screw the tip of a 10 mL 0.9% saline syringe onto the cap. 4. Using the syringe plunger, slowly push the 10 mL 0.9% saline in the syringe over 5-10 seconds. If resistance is met or pain occurs while pushing, stop pushing the saline. 5. Remove the syringe from the cap. 6. Return the stopcock lever to the usual position, pointing in the direction of the cap. 7. Dispose of used supplies properly. How do I replace the drainage bag? Replace the drainage bag, three-way stopcock, and any extension tubing as told by your health care provider. Make sure you always have an extra drainage bag and connecting tubing available. 1. Empty urine from your drainage bag. 2. Gather a new drainage bag, three-way stopcock, and any extension tubing. 3. Remove the drainage bag, three-way stopcock, and any extension tubing from the nephrostomy tube. 4. Attach the new leg bag or bedside drainage bag, three-way stopcock, and any  extension tubing to the nephrostomy tube. 5. Dispose of the used drainage bag, three-way stopcock, and any extension. Contact a health care provider if:  You have problems with any of the valves or tubing.  You have persistent pain or soreness in your back.  You have more redness, swelling, or pain around your tube insertion site.  You have more fluid or blood coming from your tube insertion  site.  Your tube insertion site feels warm to the touch.  You have pus or a bad smell coming from your tube insertion site.  You have increased urine output or you feel burning when urinating. Get help right away if:  You have pain in your abdomen during the first week.  You have chest pain or have trouble breathing.  You have a new appearance of blood in your urine.  You have a fever or chills.  You have back pain that is not relieved by your medicine.  You have decreased urine output.  Your nephrostomy tube comes out. This information is not intended to replace advice given to you by your health care provider. Make sure you discuss any questions you have with your health care provider. Document Revised: 03/07/2019 Document Reviewed: 08/26/2016 Elsevier Patient Education  2020 [employer].

## 2020-04-22 ENCOUNTER — Other Ambulatory Visit: Payer: Self-pay

## 2020-04-22 ENCOUNTER — Ambulatory Visit (INDEPENDENT_AMBULATORY_CARE_PROVIDER_SITE_OTHER): Payer: Medicare PPO

## 2020-04-22 DIAGNOSIS — J4550 Severe persistent asthma, uncomplicated: Secondary | ICD-10-CM

## 2020-04-22 MED ORDER — OMALIZUMAB 150 MG/ML ~~LOC~~ SOSY
300.0000 mg | PREFILLED_SYRINGE | Freq: Once | SUBCUTANEOUS | Status: AC
  Administered 2020-04-22: 300 mg via SUBCUTANEOUS

## 2020-04-22 MED ORDER — OMALIZUMAB 75 MG/0.5ML ~~LOC~~ SOSY
75.0000 mg | PREFILLED_SYRINGE | Freq: Once | SUBCUTANEOUS | Status: AC
Start: 2020-04-22 — End: 2020-04-22
  Administered 2020-04-22: 75 mg via SUBCUTANEOUS

## 2020-04-22 NOTE — Progress Notes (Signed)
All questions were answered by the patient before medication was administered. Have you been hospitalized in the last 10 days? No Do you have a fever? No Do you have a cough? No Do you have a headache or sore throat? No  

## 2020-04-23 DIAGNOSIS — M05741 Rheumatoid arthritis with rheumatoid factor of right hand without organ or systems involvement: Secondary | ICD-10-CM | POA: Diagnosis not present

## 2020-04-23 DIAGNOSIS — I10 Essential (primary) hypertension: Secondary | ICD-10-CM | POA: Diagnosis not present

## 2020-04-23 DIAGNOSIS — M05742 Rheumatoid arthritis with rheumatoid factor of left hand without organ or systems involvement: Secondary | ICD-10-CM | POA: Diagnosis not present

## 2020-04-23 DIAGNOSIS — M05711 Rheumatoid arthritis with rheumatoid factor of right shoulder without organ or systems involvement: Secondary | ICD-10-CM | POA: Diagnosis not present

## 2020-04-23 DIAGNOSIS — G8929 Other chronic pain: Secondary | ICD-10-CM | POA: Diagnosis not present

## 2020-04-23 DIAGNOSIS — J449 Chronic obstructive pulmonary disease, unspecified: Secondary | ICD-10-CM | POA: Diagnosis not present

## 2020-04-23 DIAGNOSIS — I2510 Atherosclerotic heart disease of native coronary artery without angina pectoris: Secondary | ICD-10-CM | POA: Diagnosis not present

## 2020-04-23 DIAGNOSIS — I4819 Other persistent atrial fibrillation: Secondary | ICD-10-CM | POA: Diagnosis not present

## 2020-04-23 DIAGNOSIS — M05712 Rheumatoid arthritis with rheumatoid factor of left shoulder without organ or systems involvement: Secondary | ICD-10-CM | POA: Diagnosis not present

## 2020-04-28 ENCOUNTER — Other Ambulatory Visit: Payer: Self-pay

## 2020-04-28 ENCOUNTER — Ambulatory Visit (INDEPENDENT_AMBULATORY_CARE_PROVIDER_SITE_OTHER): Payer: Medicare PPO

## 2020-04-28 DIAGNOSIS — I350 Nonrheumatic aortic (valve) stenosis: Secondary | ICD-10-CM | POA: Diagnosis not present

## 2020-04-29 ENCOUNTER — Other Ambulatory Visit: Payer: Self-pay | Admitting: Internal Medicine

## 2020-04-29 NOTE — Telephone Encounter (Signed)
Last filled 03-20-20 #90 Last OV 04-02-20 Next OV 05-11-20 [facility] [address] [employer]

## 2020-04-30 DIAGNOSIS — M05741 Rheumatoid arthritis with rheumatoid factor of right hand without organ or systems involvement: Secondary | ICD-10-CM | POA: Diagnosis not present

## 2020-04-30 DIAGNOSIS — G8929 Other chronic pain: Secondary | ICD-10-CM | POA: Diagnosis not present

## 2020-04-30 DIAGNOSIS — J449 Chronic obstructive pulmonary disease, unspecified: Secondary | ICD-10-CM | POA: Diagnosis not present

## 2020-04-30 DIAGNOSIS — I10 Essential (primary) hypertension: Secondary | ICD-10-CM | POA: Diagnosis not present

## 2020-04-30 DIAGNOSIS — I4819 Other persistent atrial fibrillation: Secondary | ICD-10-CM | POA: Diagnosis not present

## 2020-04-30 DIAGNOSIS — M05712 Rheumatoid arthritis with rheumatoid factor of left shoulder without organ or systems involvement: Secondary | ICD-10-CM | POA: Diagnosis not present

## 2020-04-30 DIAGNOSIS — I2510 Atherosclerotic heart disease of native coronary artery without angina pectoris: Secondary | ICD-10-CM | POA: Diagnosis not present

## 2020-04-30 DIAGNOSIS — M05742 Rheumatoid arthritis with rheumatoid factor of left hand without organ or systems involvement: Secondary | ICD-10-CM | POA: Diagnosis not present

## 2020-04-30 DIAGNOSIS — M05711 Rheumatoid arthritis with rheumatoid factor of right shoulder without organ or systems involvement: Secondary | ICD-10-CM | POA: Diagnosis not present

## 2020-05-04 DIAGNOSIS — I4819 Other persistent atrial fibrillation: Secondary | ICD-10-CM | POA: Diagnosis not present

## 2020-05-04 DIAGNOSIS — I10 Essential (primary) hypertension: Secondary | ICD-10-CM | POA: Diagnosis not present

## 2020-05-04 DIAGNOSIS — G8929 Other chronic pain: Secondary | ICD-10-CM | POA: Diagnosis not present

## 2020-05-04 DIAGNOSIS — I2510 Atherosclerotic heart disease of native coronary artery without angina pectoris: Secondary | ICD-10-CM | POA: Diagnosis not present

## 2020-05-04 DIAGNOSIS — M05711 Rheumatoid arthritis with rheumatoid factor of right shoulder without organ or systems involvement: Secondary | ICD-10-CM | POA: Diagnosis not present

## 2020-05-04 DIAGNOSIS — M05741 Rheumatoid arthritis with rheumatoid factor of right hand without organ or systems involvement: Secondary | ICD-10-CM | POA: Diagnosis not present

## 2020-05-04 DIAGNOSIS — J449 Chronic obstructive pulmonary disease, unspecified: Secondary | ICD-10-CM | POA: Diagnosis not present

## 2020-05-04 DIAGNOSIS — M05712 Rheumatoid arthritis with rheumatoid factor of left shoulder without organ or systems involvement: Secondary | ICD-10-CM | POA: Diagnosis not present

## 2020-05-04 DIAGNOSIS — M05742 Rheumatoid arthritis with rheumatoid factor of left hand without organ or systems involvement: Secondary | ICD-10-CM | POA: Diagnosis not present

## 2020-05-05 ENCOUNTER — Encounter: Payer: Self-pay | Admitting: Cardiovascular Disease

## 2020-05-05 ENCOUNTER — Other Ambulatory Visit: Payer: Self-pay

## 2020-05-05 ENCOUNTER — Ambulatory Visit: Payer: Medicare PPO | Admitting: Cardiovascular Disease

## 2020-05-05 VITALS — BP 110/62 | HR 55 | Ht 69.0 in | Wt 200.0 lb

## 2020-05-05 DIAGNOSIS — I4819 Other persistent atrial fibrillation: Secondary | ICD-10-CM | POA: Diagnosis not present

## 2020-05-05 DIAGNOSIS — I5032 Chronic diastolic (congestive) heart failure: Secondary | ICD-10-CM | POA: Diagnosis not present

## 2020-05-05 DIAGNOSIS — I359 Nonrheumatic aortic valve disorder, unspecified: Secondary | ICD-10-CM | POA: Diagnosis not present

## 2020-05-05 NOTE — Progress Notes (Signed)
Cardiology Office Note   Date:  05/05/2020   ID:  [name], DOB Dec 16, 1933, MRN [id number]  PCP:  [name], MD  Cardiologist:   Kathlyn Sacramento, MD   Chief Complaint  Patient presents with  . OTHER    F/u echo. Meds reviewed verbally with pt.      History of Present Illness: [name] is a 86 y.o. male who presents for a follow-up visit regarding atrial fibrillation .The patient has chronic medical conditions that include rheumatoid arthritis, history of TIA, hyperlipidemia, chronic hyponatremia due to suspected SIADH and osteoarthritis. He had an echocardiogram done in June 2019 which showed normal LV systolic function with very mild aortic stenosis and mild mitral regurgitation. He had worsening heart failure in 2020.  He was noted to be in atrial fibrillation.  Repeat echocardiogram showed normal LV systolic function with mild pulmonary hypertension.  He was switched from Plavix to Eliquis 5 mg twice daily and was also started on metoprolol 25 mg twice daily.   He was thought to have diastolic heart failure related to atrial fibrillation.  He had successful cardioversion in May of last year.   He underwent a right hand-assisted laparoscopic nephrectomy in July.  His postoperative course was complicated by an ileus which resolved spontaneously.  He had a repeat echocardiogram last week to follow-up on aortic stenosis.  Echo showed normal LV systolic function, grade 2 diastolic dysfunction, mild pulmonary hypertension and stable mild aortic stenosis.  Aortic root was mildly dilated at 42 mmHg.  He has been doing very well with no recent chest pain, shortness of breath or palpitations.  He did notice worsening bilateral leg edema.  His weight also increased about 15 pounds over the last few months but he attributes that to better appetite.  Past Medical History:  Diagnosis Date  . (HFpEF) heart failure with preserved ejection fraction ([place])    a. 02/2019 Echo: Ef  60-65%, mildly reduced RV fxn. RVSP 45mmHg. Mildly dil LA. Mod dil RA. Mild to mod TR. Mild to mod AS. Triv AI.  Marland Kitchen Allergy   . Anemia   . Anxiety   . Arthritis    rheumatoid  . Asthma   . Basal cell carcinoma    back  . CAD (coronary artery disease)   . CHF (congestive heart failure) ([place])   . Chronic kidney disease    Mass right kidney  . Chronic sinusitis   . Collagen vascular disease (HCC)    Rheumatoid Arthritis  . Depression   . Diverticulitis   . Dysrhythmia   . ED (erectile dysfunction)   . GERD (gastroesophageal reflux disease)   . History of SIADH   . Hx of adenomatous colonic polyps   . Hypertension   . Hyponatremia   . IBS (irritable bowel syndrome)   . Neurodermatitis   . Neurogenic bladder   . OSA (obstructive sleep apnea)    no CPAP since weight loss  . Persistent atrial fibrillation ([place])    a. Dx 02/2019; b. CHA2DS2VASc = 6-->eliquis initiated.  . Prostate cancer ([place])   . Renal mass    10/2018 4.2cm R renal cortical mass. Most compatible w/ clear cell renal cell carcinoma.    Past Surgical History:  Procedure Laterality Date  . CARDIOVERSION N/A 04/23/2019   Procedure: CARDIOVERSION;  Surgeon: Nelva Bush, MD;  Location: [facility] ORS;  Service: Cardiovascular;  Laterality: N/A;  . KNEE ARTHROPLASTY Right 09/02/2015   Procedure: COMPUTER ASSISTED TOTAL KNEE  ARTHROPLASTY;  Surgeon: [name], MD;  Location: [facility] ORS;  Service: Orthopedics;  Laterality: Right;  . LAPAROSCOPIC NEPHRECTOMY, HAND ASSISTED Right 06/17/2019   Procedure: HAND ASSISTED LAPAROSCOPIC NEPHRECTOMY;  Surgeon: Hollice Espy, MD;  Location: [facility] ORS;  Service: Urology;  Laterality: Right;  . NASAL SINUS SURGERY  2009   DEVIATED SEPTUM AND POLYPS  . permanent indwelling catheter    . PROSTATE SURGERY     PROSTATECTOMY  . TOTAL KNEE ARTHROPLASTY Left 9/15   Dr [name]  . URETHRAL STRICTURE DILATATION  02-2010   Dr.Cope     Current Outpatient Medications  Medication Sig  Dispense Refill  . acetaminophen (TYLENOL) 500 MG tablet Take 500 mg by mouth every 8 (eight) hours as needed for moderate pain or headache.     . albuterol (PROVENTIL HFA;VENTOLIN HFA) 108 (90 Base) MCG/ACT inhaler Inhale 2 puffs into the lungs every 6 (six) hours as needed for wheezing or shortness of breath. 1 Inhaler 3  . ALOE PO Take by mouth.    . ALPRAZolam (XANAX) 0.5 MG tablet TAKE 1 TABLET BY MOUTH THREE TIMES DAILY AS NEEDED FOR ANXIETY OR SLEEP 90 tablet 0  . apixaban (ELIQUIS) 5 MG TABS tablet Take 1 tablet (5 mg total) by mouth 2 (two) times daily. 60 tablet 6  . Ascorbic Acid (VITAMIN C PO) Take by mouth.    . Cholecalciferol (VITAMIN D3) 5000 UNITS CAPS Take 5,000 Units by mouth daily.     . Coenzyme Q10 (CO Q 10) 100 MG CAPS Take 100 mg by mouth daily.     . fluticasone (FLONASE) 50 MCG/ACT nasal spray Place 2 sprays into both nostrils 2 (two) times daily. 48 g 3  . folic acid (FOLVITE) 188 MCG tablet Take 1,200 mcg by mouth daily.    . furosemide (LASIX) 20 MG tablet Take 1 tablet (20 mg total) by mouth daily. 90 tablet 1  . gabapentin (NEURONTIN) 300 MG capsule Take 1 capsule (300 mg total) by mouth 3 (three) times daily. 90 capsule 11  . hydroxychloroquine (PLAQUENIL) 200 MG tablet Take 200 mg by mouth 2 (two) times daily.     . methotrexate 250 MG/10ML injection Inject 0.8 mLs into the skin once a week.    . metoprolol tartrate (LOPRESSOR) 25 MG tablet Take 0.5 tablets (12.5 mg total) by mouth 2 (two) times daily. 90 tablet 3  . Multiple Vitamin (MULTIVITAMIN) tablet Take 1 tablet by mouth daily.      Marland Kitchen MYRBETRIQ 50 MG TB24 tablet TAKE 1 TABLET(50 MG) BY MOUTH DAILY 30 tablet 3  . OIL OF OREGANO PO Take by mouth.    . polyethylene glycol powder (GLYCOLAX/MIRALAX) 17 GM/SCOOP powder Take 17 g by mouth at bedtime.     . pravastatin (PRAVACHOL) 20 MG tablet Take 1 tablet (20 mg total) by mouth at bedtime. 90 tablet 3  . Probiotic Product (ALIGN) 4 MG CAPS Take 1 capsule (4 mg  total) by mouth daily. 15 capsule 0  . Selenium 200 MCG CAPS Take 200 mcg by mouth daily.     . sodium chloride 1 g tablet Take 1 tablet (1 g total) by mouth 2 (two) times daily with a meal. (Patient taking differently: Take 1 g by mouth 3 (three) times daily with meals. ) 60 tablet 1  . Turmeric 500 MG CAPS Take 500 mg by mouth daily.     . Zinc 50 MG TABS Take by mouth.     Current Facility-Administered Medications  Medication Dose Route Frequency Provider Last Rate Last Admin  . omalizumab Arvid Right) prefilled syringe 300 mg  300 mg Subcutaneous Once Chesley Mires, MD      . omalizumab Arvid Right) prefilled syringe 75 mg  75 mg Subcutaneous Once Chesley Mires, MD        Allergies:   Ciprofloxacin, Citalopram hydrobromide, Clindamycin, Lorazepam, Paroxetine, Ramipril, Simvastatin, and Sulfa antibiotics    Social History:  The patient  reports that he has never smoked. He has never used smokeless tobacco. He reports current alcohol use. He reports that he does not use drugs.   Family History:  The patient's family history includes Heart disease (age of onset: 80) in his mother; Pneumonia (age of onset: 55) in his father; Skin cancer in his father, sister, and son.    ROS:  Please see the history of present illness.   Otherwise, review of systems are positive for none.   All other systems are reviewed and negative.    PHYSICAL EXAM: VS:  BP 110/62 (BP Location: Left Arm, Patient Position: Sitting, Cuff Size: Normal)   Pulse (!) 55   Ht 5\' 9"  (1.753 m)   Wt 200 lb (90.7 kg)   SpO2 98%   BMI 29.53 kg/m  , BMI Body mass index is 29.53 kg/m. GEN: Well nourished, well developed, in no acute distress  HEENT: normal  Neck: no JVD, carotid bruits, or masses Cardiac: Regular rate and rhythm; no  rubs, or gallops .  2/ 6 systolic murmur in the aortic area which is mid peaking.  Mild bilateral leg edema Respiratory:  clear to auscultation bilaterally, normal work of breathing GI: soft, nontender,  nondistended, + BS MS: no deformity or atrophy  Skin: warm and dry, no rash Neuro:  Strength and sensation are intact Psych: euthymic mood, full affect   EKG:  EKG is ordered today. The ekg ordered today demonstrates sinus bradycardia with first-degree AV block, left axis deviation and right bundle branch block.   Recent Labs: 06/22/2019: BUN 27; Hemoglobin 8.5; Platelets 188; Potassium 4.1; Sodium 132 01/17/2020: Creatinine, Ser 1.00    Lipid Panel    Component Value Date/Time   CHOL 114 05/26/2018 0411   TRIG 49 05/26/2018 0411   HDL 43 05/26/2018 0411   CHOLHDL 2.7 05/26/2018 0411   VLDL 10 05/26/2018 0411   LDLCALC 61 05/26/2018 0411      Wt Readings from Last 3 Encounters:  05/05/20 200 lb (90.7 kg)  04/02/20 193 lb (87.5 kg)  03/18/20 191 lb (86.6 kg)       PAD Screen 12/28/2018  Previous PAD dx? No  Previous surgical procedure? (No Data)  Pain with walking? Yes  Subsides with rest? Yes  Feet/toe relief with dangling? No  Painful, non-healing ulcers? No  Extremities discolored? Yes      ASSESSMENT AND PLAN:  1.  Persistent atrial fibrillation: Maintaining sinus rhythm post cardioversion.  Continue small dose metoprolol.  He is tolerating anticoagulation with Eliquis.  I reviewed his most recent labs done in February which showed normal renal function and improved anemia with hemoglobin of 11.    2.  Chronic diastolic heart failure: He appears mildly volume overloaded on current dose of furosemide 20 mg once daily.  Some of his weight gain is likely related to improved appetite.  He is going to continue monitoring leg edema and if needed increase furosemide to 40 mg daily.  He also has an upcoming follow-up appointment with Dr. Holley Raring.    3.  Aortic stenosis: This was mild on most recent echocardiogram.  Repeat echocardiogram in 1 year.  Aortic root was mildly dilated but I doubt that it will progress to a surgical threshold considering his  age.   Disposition:   FU in 6 months.  Signed,  Kathlyn Sacramento, MD  05/05/2020 2:38 PM    [place]

## 2020-05-05 NOTE — Patient Instructions (Signed)

## 2020-05-07 DIAGNOSIS — M05711 Rheumatoid arthritis with rheumatoid factor of right shoulder without organ or systems involvement: Secondary | ICD-10-CM | POA: Diagnosis not present

## 2020-05-07 DIAGNOSIS — I10 Essential (primary) hypertension: Secondary | ICD-10-CM | POA: Diagnosis not present

## 2020-05-07 DIAGNOSIS — M05742 Rheumatoid arthritis with rheumatoid factor of left hand without organ or systems involvement: Secondary | ICD-10-CM | POA: Diagnosis not present

## 2020-05-07 DIAGNOSIS — M05741 Rheumatoid arthritis with rheumatoid factor of right hand without organ or systems involvement: Secondary | ICD-10-CM | POA: Diagnosis not present

## 2020-05-07 DIAGNOSIS — J449 Chronic obstructive pulmonary disease, unspecified: Secondary | ICD-10-CM | POA: Diagnosis not present

## 2020-05-07 DIAGNOSIS — I4819 Other persistent atrial fibrillation: Secondary | ICD-10-CM | POA: Diagnosis not present

## 2020-05-07 DIAGNOSIS — I2510 Atherosclerotic heart disease of native coronary artery without angina pectoris: Secondary | ICD-10-CM | POA: Diagnosis not present

## 2020-05-07 DIAGNOSIS — M05712 Rheumatoid arthritis with rheumatoid factor of left shoulder without organ or systems involvement: Secondary | ICD-10-CM | POA: Diagnosis not present

## 2020-05-07 DIAGNOSIS — G8929 Other chronic pain: Secondary | ICD-10-CM | POA: Diagnosis not present

## 2020-05-08 ENCOUNTER — Ambulatory Visit (INDEPENDENT_AMBULATORY_CARE_PROVIDER_SITE_OTHER): Payer: Medicare PPO

## 2020-05-08 ENCOUNTER — Other Ambulatory Visit: Payer: Self-pay

## 2020-05-08 DIAGNOSIS — J4550 Severe persistent asthma, uncomplicated: Secondary | ICD-10-CM | POA: Diagnosis not present

## 2020-05-08 MED ORDER — OMALIZUMAB 75 MG/0.5ML ~~LOC~~ SOSY
75.0000 mg | PREFILLED_SYRINGE | Freq: Once | SUBCUTANEOUS | Status: AC
  Administered 2020-05-08: 75 mg via SUBCUTANEOUS

## 2020-05-08 MED ORDER — OMALIZUMAB 150 MG/ML ~~LOC~~ SOSY
300.0000 mg | PREFILLED_SYRINGE | Freq: Once | SUBCUTANEOUS | Status: AC
  Administered 2020-05-08: 300 mg via SUBCUTANEOUS

## 2020-05-08 NOTE — Progress Notes (Signed)
Have you been hospitalized within the last 10 days?  No Do you have a fever?  No Do you have a cough?  No Do you have a headache or sore throat? No Do you have your Epi Pen visible and is it within date?  Yes 

## 2020-05-11 ENCOUNTER — Other Ambulatory Visit: Payer: Self-pay

## 2020-05-11 ENCOUNTER — Encounter: Payer: Self-pay | Admitting: Internal Medicine

## 2020-05-11 ENCOUNTER — Ambulatory Visit: Payer: Medicare PPO | Admitting: Internal Medicine

## 2020-05-11 ENCOUNTER — Telehealth: Payer: Self-pay | Admitting: Pulmonary Disease

## 2020-05-11 VITALS — BP 110/70 | HR 64 | Temp 97.9°F | Ht 69.0 in | Wt 199.0 lb

## 2020-05-11 DIAGNOSIS — E222 Syndrome of inappropriate secretion of antidiuretic hormone: Secondary | ICD-10-CM

## 2020-05-11 DIAGNOSIS — M069 Rheumatoid arthritis, unspecified: Secondary | ICD-10-CM | POA: Diagnosis not present

## 2020-05-11 DIAGNOSIS — I480 Paroxysmal atrial fibrillation: Secondary | ICD-10-CM

## 2020-05-11 DIAGNOSIS — J4540 Moderate persistent asthma, uncomplicated: Secondary | ICD-10-CM

## 2020-05-11 DIAGNOSIS — F39 Unspecified mood [affective] disorder: Secondary | ICD-10-CM | POA: Diagnosis not present

## 2020-05-11 DIAGNOSIS — I2510 Atherosclerotic heart disease of native coronary artery without angina pectoris: Secondary | ICD-10-CM | POA: Diagnosis not present

## 2020-05-11 NOTE — Assessment & Plan Note (Signed)
Regular now Is on eliquis and metoprolol 

## 2020-05-11 NOTE — Assessment & Plan Note (Signed)
Doing well with [employer]

## 2020-05-11 NOTE — Assessment & Plan Note (Signed)
Chronic anxiety Uses the xanax mostly for sleep now No sig depression now

## 2020-05-11 NOTE — Assessment & Plan Note (Signed)
Continues with Dr Jefm Bryant On plaquenil and methotrexate

## 2020-05-11 NOTE — Progress Notes (Signed)
Subjective:    Patient ID: [name], male    DOB: 01-31-1934, 86 y.o.   MRN: [id number]  HPI Here with daughter for follow up of multiple medical conditions This visit occurred during the SARS-CoV-2 public health emergency.  Safety protocols were in place, including screening questions prior to the visit, additional usage of staff PPE, and extensive cleaning of exam room while observing appropriate contact time as indicated for disinfecting solutions.   Feeling fairly good Has been able to get back to church  Chest pain has resolved Breathing good with the xolair  Now having constipation again Using miralax regularly--sometimes needs more than 1 dose per day (or adding prunes)  Chronic pain from the RA Still on the MTX and plaquenil Just finishing PT now--did get some relief and help from this Tries to walk around the house and doing other muscle strengthening exercises Uses tylenol for prn use  No palpitations No recent chest pain Good report from Dr Fletcher Anon last week Reviewed recent echocardiogram  Anxiety persists but he feels it is controlled Uses the xanax every night--2 of them.  Not using it during the day --unless "freaked out about something" Also uses it before his catheter is changed--makes it go better  Last saw Dr Holley Raring in February Still on salt tabs and furosemide for SIADH  Current Outpatient Medications on File Prior to Visit  Medication Sig Dispense Refill  . acetaminophen (TYLENOL) 500 MG tablet Take 500 mg by mouth every 8 (eight) hours as needed for moderate pain or headache.     . albuterol (PROVENTIL HFA;VENTOLIN HFA) 108 (90 Base) MCG/ACT inhaler Inhale 2 puffs into the lungs every 6 (six) hours as needed for wheezing or shortness of breath. 1 Inhaler 3  . ALOE PO Take by mouth.    . ALPRAZolam (XANAX) 0.5 MG tablet TAKE 1 TABLET BY MOUTH THREE TIMES DAILY AS NEEDED FOR ANXIETY OR SLEEP 90 tablet 0  . apixaban (ELIQUIS) 5 MG TABS tablet Take 1  tablet (5 mg total) by mouth 2 (two) times daily. 60 tablet 6  . Ascorbic Acid (VITAMIN C PO) Take by mouth.    . Cholecalciferol (VITAMIN D3) 5000 UNITS CAPS Take 5,000 Units by mouth daily.     . Coenzyme Q10 (CO Q 10) 100 MG CAPS Take 100 mg by mouth daily.     . fluticasone (FLONASE) 50 MCG/ACT nasal spray Place 2 sprays into both nostrils 2 (two) times daily. 48 g 3  . folic acid (FOLVITE) 782 MCG tablet Take 1,200 mcg by mouth daily.    . furosemide (LASIX) 20 MG tablet Take 1 tablet (20 mg total) by mouth daily. 90 tablet 1  . gabapentin (NEURONTIN) 300 MG capsule Take 1 capsule (300 mg total) by mouth 3 (three) times daily. 90 capsule 11  . hydroxychloroquine (PLAQUENIL) 200 MG tablet Take 200 mg by mouth 2 (two) times daily.     . methotrexate 250 MG/10ML injection Inject 0.8 mLs into the skin once a week.    . metoprolol tartrate (LOPRESSOR) 25 MG tablet Take 0.5 tablets (12.5 mg total) by mouth 2 (two) times daily. 90 tablet 3  . Multiple Vitamin (MULTIVITAMIN) tablet Take 1 tablet by mouth daily.      Marland Kitchen MYRBETRIQ 50 MG TB24 tablet TAKE 1 TABLET(50 MG) BY MOUTH DAILY 30 tablet 3  . OIL OF OREGANO PO Take by mouth.    . polyethylene glycol powder (GLYCOLAX/MIRALAX) 17 GM/SCOOP powder Take 17 g by  mouth at bedtime.     . pravastatin (PRAVACHOL) 20 MG tablet Take 1 tablet (20 mg total) by mouth at bedtime. 90 tablet 3  . Probiotic Product (ALIGN) 4 MG CAPS Take 1 capsule (4 mg total) by mouth daily. 15 capsule 0  . Selenium 200 MCG CAPS Take 200 mcg by mouth daily.     . sodium chloride 1 g tablet Take 1 tablet (1 g total) by mouth 2 (two) times daily with a meal. (Patient taking differently: Take 1 g by mouth 3 (three) times daily with meals. ) 60 tablet 1  . Turmeric 500 MG CAPS Take 500 mg by mouth daily.     . Zinc 50 MG TABS Take by mouth.     Current Facility-Administered Medications on File Prior to Visit  Medication Dose Route Frequency Provider Last Rate Last Admin  .  omalizumab Arvid Right) prefilled syringe 300 mg  300 mg Subcutaneous Once Chesley Mires, MD      . omalizumab Arvid Right) prefilled syringe 75 mg  75 mg Subcutaneous Once Chesley Mires, MD        Allergies  Allergen Reactions  . Ciprofloxacin Nausea And Vomiting    Headache  . Citalopram Hydrobromide Other (See Comments)    "unknown"  . Clindamycin Nausea And Vomiting  . Lorazepam Other (See Comments)    Adverse reaction  . Paroxetine Nausea Only  . Ramipril Other (See Comments)    "unknown"  . Simvastatin   . Sulfa Antibiotics Other (See Comments)    Past Medical History:  Diagnosis Date  . (HFpEF) heart failure with preserved ejection fraction ([place])    a. 02/2019 Echo: Ef 60-65%, mildly reduced RV fxn. RVSP 83mmHg. Mildly dil LA. Mod dil RA. Mild to mod TR. Mild to mod AS. Triv AI.  Marland Kitchen Allergy   . Anemia   . Anxiety   . Arthritis    rheumatoid  . Asthma   . Basal cell carcinoma    back  . CAD (coronary artery disease)   . CHF (congestive heart failure) ([place])   . Chronic kidney disease    Mass right kidney  . Chronic sinusitis   . Collagen vascular disease (HCC)    Rheumatoid Arthritis  . Depression   . Diverticulitis   . Dysrhythmia   . ED (erectile dysfunction)   . GERD (gastroesophageal reflux disease)   . History of SIADH   . Hx of adenomatous colonic polyps   . Hypertension   . Hyponatremia   . IBS (irritable bowel syndrome)   . Neurodermatitis   . Neurogenic bladder   . OSA (obstructive sleep apnea)    no CPAP since weight loss  . Persistent atrial fibrillation ([place])    a. Dx 02/2019; b. CHA2DS2VASc = 6-->eliquis initiated.  . Prostate cancer ([place])   . Renal mass    10/2018 4.2cm R renal cortical mass. Most compatible w/ clear cell renal cell carcinoma.    Past Surgical History:  Procedure Laterality Date  . CARDIOVERSION N/A 04/23/2019   Procedure: CARDIOVERSION;  Surgeon: Nelva Bush, MD;  Location: [facility] ORS;  Service: Cardiovascular;  Laterality: N/A;   . KNEE ARTHROPLASTY Right 09/02/2015   Procedure: COMPUTER ASSISTED TOTAL KNEE ARTHROPLASTY;  Surgeon: [name], MD;  Location: [facility] ORS;  Service: Orthopedics;  Laterality: Right;  . LAPAROSCOPIC NEPHRECTOMY, HAND ASSISTED Right 06/17/2019   Procedure: HAND ASSISTED LAPAROSCOPIC NEPHRECTOMY;  Surgeon: Hollice Espy, MD;  Location: [facility] ORS;  Service: Urology;  Laterality: Right;  .  NASAL SINUS SURGERY  2009   DEVIATED SEPTUM AND POLYPS  . permanent indwelling catheter    . PROSTATE SURGERY     PROSTATECTOMY  . TOTAL KNEE ARTHROPLASTY Left 9/15   Dr [name]  . URETHRAL STRICTURE DILATATION  02-2010   Dr.Cope    Family History  Problem Relation Age of Onset  . Heart disease Mother 13       heart failure  . Pneumonia Father 19       pnemonia  . Skin cancer Father   . Skin cancer Sister   . Skin cancer Son   . Colon cancer Neg Hx   . Esophageal cancer Neg Hx   . Stomach cancer Neg Hx   . Pancreatic cancer Neg Hx   . Liver disease Neg Hx     Social History   Socioeconomic History  . Marital status: Married    Spouse name: Not on file  . Number of children: 2  . Years of education: Not on file  . Highest education level: Not on file  Occupational History  . Occupation: Radio Licensed conveyancer    Comment: retired  Tobacco Use  . Smoking status: Never Smoker  . Smokeless tobacco: Never Used  Vaping Use  . Vaping Use: Never used  Substance and Sexual Activity  . Alcohol use: Yes    Comment: 5 days out of 7 drinks gin   . Drug use: No  . Sexual activity: Not Currently  Other Topics Concern  . Not on file  Social History Narrative   Not sure about a living will or health care POA   Wife should make health care decisions for him--then daughter, then son   Would accept resuscitation attempts   Not sure about tube feeds   Social Determinants of Health   Financial Resource Strain:   . Difficulty of Paying Living Expenses:   Food Insecurity:   . Worried About  Charity fundraiser in the Last Year:   . Arboriculturist in the Last Year:   Transportation Needs:   . Film/video editor (Medical):   Marland Kitchen Lack of Transportation (Non-Medical):   Physical Activity:   . Days of Exercise per Week:   . Minutes of Exercise per Session:   Stress:   . Feeling of Stress :   Social Connections:   . Frequency of Communication with Friends and Family:   . Frequency of Social Gatherings with Friends and Family:   . Attends Religious Services:   . Active Member of Clubs or Organizations:   . Attends Archivist Meetings:   Marland Kitchen Marital Status:   Intimate Partner Violence:   . Fear of Current or Ex-Partner:   . Emotionally Abused:   Marland Kitchen Physically Abused:   . Sexually Abused:    Review of Systems Mild edema lately--wearing support hose Appetite is good Weight is up some from last visit    Objective:   Physical Exam  Constitutional: No distress.  Cardiovascular: Normal rate and regular rhythm.  ?soft S4  Respiratory: Effort normal and breath sounds normal. He has no wheezes. He has no rales.  Musculoskeletal:     Comments: No active synovitis evident  Lymphadenopathy:    He has no cervical adenopathy.  Neurological: He is alert.  Skin: No rash noted.  Psychiatric: His behavior is normal. Mood normal.           Assessment & Plan:

## 2020-05-11 NOTE — Assessment & Plan Note (Signed)
No active angina Diastolic dysfunction on echo but no CHF Continues on metoprolol, statin

## 2020-05-11 NOTE — Assessment & Plan Note (Signed)
On salt and furosemide (and some degree of fluid restriction) Will recheck labs

## 2020-05-11 NOTE — Telephone Encounter (Signed)
Xolair Prefilled Syringe Order: 150mg  Prefilled Syringe:  #4 75mg  Prefilled Syringe: #2 Ordered Date: 05/11/2020 Expected date of arrival: 05/12/2020 Ordered by: Desmond Dike, [place]  [facility]ty [facility]

## 2020-05-12 DIAGNOSIS — M05712 Rheumatoid arthritis with rheumatoid factor of left shoulder without organ or systems involvement: Secondary | ICD-10-CM | POA: Diagnosis not present

## 2020-05-12 DIAGNOSIS — M05711 Rheumatoid arthritis with rheumatoid factor of right shoulder without organ or systems involvement: Secondary | ICD-10-CM | POA: Diagnosis not present

## 2020-05-12 DIAGNOSIS — I2510 Atherosclerotic heart disease of native coronary artery without angina pectoris: Secondary | ICD-10-CM | POA: Diagnosis not present

## 2020-05-12 DIAGNOSIS — I10 Essential (primary) hypertension: Secondary | ICD-10-CM | POA: Diagnosis not present

## 2020-05-12 DIAGNOSIS — M05741 Rheumatoid arthritis with rheumatoid factor of right hand without organ or systems involvement: Secondary | ICD-10-CM | POA: Diagnosis not present

## 2020-05-12 DIAGNOSIS — J449 Chronic obstructive pulmonary disease, unspecified: Secondary | ICD-10-CM | POA: Diagnosis not present

## 2020-05-12 DIAGNOSIS — I4819 Other persistent atrial fibrillation: Secondary | ICD-10-CM | POA: Diagnosis not present

## 2020-05-12 DIAGNOSIS — M05742 Rheumatoid arthritis with rheumatoid factor of left hand without organ or systems involvement: Secondary | ICD-10-CM | POA: Diagnosis not present

## 2020-05-12 DIAGNOSIS — G8929 Other chronic pain: Secondary | ICD-10-CM | POA: Diagnosis not present

## 2020-05-12 LAB — CBC
HCT: 33.5 % — ABNORMAL LOW (ref 39.0–52.0)
Hemoglobin: 11.4 g/dL — ABNORMAL LOW (ref 13.0–17.0)
MCHC: 34.1 g/dL (ref 30.0–36.0)
MCV: 97.6 fl (ref 78.0–100.0)
Platelets: 183 10*3/uL (ref 150.0–400.0)
RBC: 3.43 Mil/uL — ABNORMAL LOW (ref 4.22–5.81)
RDW: 16 % — ABNORMAL HIGH (ref 11.5–15.5)
WBC: 5.6 10*3/uL (ref 4.0–10.5)

## 2020-05-12 LAB — HEPATIC FUNCTION PANEL
ALT: 14 U/L (ref 0–53)
AST: 22 U/L (ref 0–37)
Albumin: 3.7 g/dL (ref 3.5–5.2)
Alkaline Phosphatase: 49 U/L (ref 39–117)
Bilirubin, Direct: 0.2 mg/dL (ref 0.0–0.3)
Total Bilirubin: 0.5 mg/dL (ref 0.2–1.2)
Total Protein: 6.3 g/dL (ref 6.0–8.3)

## 2020-05-12 LAB — LIPID PANEL
Cholesterol: 115 mg/dL (ref 0–200)
HDL: 56.5 mg/dL (ref >39.00)
LDL Cholesterol: 47 mg/dL (ref 0–99)
NonHDL: 58.56
Total CHOL/HDL Ratio: 2
Triglycerides: 60 mg/dL (ref 0.0–149.0)
VLDL: 12 mg/dL (ref 0.0–40.0)

## 2020-05-12 LAB — RENAL FUNCTION PANEL
Albumin: 3.7 g/dL (ref 3.5–5.2)
BUN: 21 mg/dL (ref 6–23)
CO2: 28 mEq/L (ref 19–32)
Calcium: 8.7 mg/dL (ref 8.4–10.5)
Chloride: 97 mEq/L (ref 96–112)
Creatinine, Ser: 0.99 mg/dL (ref 0.40–1.50)
GFR: 72.02 mL/min (ref >60.00)
Glucose, Bld: 84 mg/dL (ref 70–99)
Phosphorus: 3.8 mg/dL (ref 2.3–4.6)
Potassium: 4.3 mEq/L (ref 3.5–5.1)
Sodium: 131 mEq/L — ABNORMAL LOW (ref 135–145)

## 2020-05-12 LAB — T4, FREE: Free T4: 1.06 ng/dL (ref 0.60–1.60)

## 2020-05-12 NOTE — Telephone Encounter (Signed)
Xolair Prefilled Syringe Received:  150mg  Prefilled Syringe >> quantity #4, lot # [id number], exp date 01/2021 75mg  Prefilled Syringe >> quantity #2, lot # [id number], exp date 09/2020 Medication arrival date: 05/12/2020 Received by: Desmond Dike, [place]

## 2020-05-14 DIAGNOSIS — G8929 Other chronic pain: Secondary | ICD-10-CM | POA: Diagnosis not present

## 2020-05-14 DIAGNOSIS — M05711 Rheumatoid arthritis with rheumatoid factor of right shoulder without organ or systems involvement: Secondary | ICD-10-CM | POA: Diagnosis not present

## 2020-05-14 DIAGNOSIS — I2510 Atherosclerotic heart disease of native coronary artery without angina pectoris: Secondary | ICD-10-CM | POA: Diagnosis not present

## 2020-05-14 DIAGNOSIS — M05712 Rheumatoid arthritis with rheumatoid factor of left shoulder without organ or systems involvement: Secondary | ICD-10-CM | POA: Diagnosis not present

## 2020-05-14 DIAGNOSIS — I10 Essential (primary) hypertension: Secondary | ICD-10-CM | POA: Diagnosis not present

## 2020-05-14 DIAGNOSIS — M05741 Rheumatoid arthritis with rheumatoid factor of right hand without organ or systems involvement: Secondary | ICD-10-CM | POA: Diagnosis not present

## 2020-05-14 DIAGNOSIS — I4819 Other persistent atrial fibrillation: Secondary | ICD-10-CM | POA: Diagnosis not present

## 2020-05-14 DIAGNOSIS — M05742 Rheumatoid arthritis with rheumatoid factor of left hand without organ or systems involvement: Secondary | ICD-10-CM | POA: Diagnosis not present

## 2020-05-14 DIAGNOSIS — J449 Chronic obstructive pulmonary disease, unspecified: Secondary | ICD-10-CM | POA: Diagnosis not present

## 2020-05-20 ENCOUNTER — Other Ambulatory Visit: Payer: Self-pay | Admitting: Urology

## 2020-05-22 ENCOUNTER — Ambulatory Visit: Payer: Self-pay | Admitting: Physician Assistant

## 2020-05-22 ENCOUNTER — Ambulatory Visit: Payer: Medicare PPO

## 2020-05-22 ENCOUNTER — Telehealth: Payer: Self-pay | Admitting: Internal Medicine

## 2020-05-22 NOTE — Telephone Encounter (Signed)
Patient called about lab results.  He stated he requested a copy of the results but has never received anything  Please advise

## 2020-05-22 NOTE — Telephone Encounter (Signed)
Printed and mailed lab results from 05/11/20

## 2020-05-26 ENCOUNTER — Other Ambulatory Visit: Payer: Self-pay

## 2020-05-26 ENCOUNTER — Encounter: Payer: Self-pay | Admitting: Physician Assistant

## 2020-05-26 ENCOUNTER — Ambulatory Visit (INDEPENDENT_AMBULATORY_CARE_PROVIDER_SITE_OTHER): Payer: Medicare PPO | Admitting: Physician Assistant

## 2020-05-26 VITALS — BP 129/72 | HR 58

## 2020-05-26 DIAGNOSIS — N319 Neuromuscular dysfunction of bladder, unspecified: Secondary | ICD-10-CM

## 2020-05-26 NOTE — Progress Notes (Signed)
Cath Change/ Replacement  Patient is present today for a catheter change due to urinary retention.  44ml of water was removed from the balloon, a 16FR foley cath was removed without difficulty.  Patient was cleaned and prepped in a sterile fashion with betadine and 2% lidocaine jelly was instilled into the urethra. A 16 FR foley cath was replaced into the bladder no complications were noted Urine return was noted 85ml and urine was pink in color. The balloon was filled with 47ml of sterile water.  A patient-provided leg bag was attached for drainage.  Patient declined additional supplies.  Patient was given proper instruction on catheter care.    Performed by: Debroah Loop, PA-C   Follow up: Return in about 4 weeks (around 06/23/2020) for Catheter exchange.

## 2020-05-27 ENCOUNTER — Ambulatory Visit (INDEPENDENT_AMBULATORY_CARE_PROVIDER_SITE_OTHER): Payer: Medicare PPO

## 2020-05-27 DIAGNOSIS — J4550 Severe persistent asthma, uncomplicated: Secondary | ICD-10-CM | POA: Diagnosis not present

## 2020-05-27 MED ORDER — OMALIZUMAB 150 MG/ML ~~LOC~~ SOSY
300.0000 mg | PREFILLED_SYRINGE | Freq: Once | SUBCUTANEOUS | Status: AC
  Administered 2020-05-27: 300 mg via SUBCUTANEOUS

## 2020-05-27 MED ORDER — OMALIZUMAB 75 MG/0.5ML ~~LOC~~ SOSY
75.0000 mg | PREFILLED_SYRINGE | Freq: Once | SUBCUTANEOUS | Status: AC
  Administered 2020-05-27: 75 mg via SUBCUTANEOUS

## 2020-05-27 NOTE — Telephone Encounter (Signed)
Patient's daughter called, she stated that patient never received the lab results They will be near our office this afternoon and would like to know if they could pick them up today from [place]    Phone- [phone]

## 2020-05-27 NOTE — Telephone Encounter (Signed)
Patient stopped by office to pick up. Had regina laws print for patient. Patient given lab results

## 2020-05-27 NOTE — Progress Notes (Signed)
All questions were answered by the patient before medication was administered. Have you been hospitalized in the last 10 days? No Do you have a fever? No Do you have a cough? No Do you have a headache or sore throat? No  

## 2020-06-01 ENCOUNTER — Other Ambulatory Visit: Payer: Self-pay | Admitting: Cardiovascular Disease

## 2020-06-02 DIAGNOSIS — M25511 Pain in right shoulder: Secondary | ICD-10-CM | POA: Diagnosis not present

## 2020-06-02 DIAGNOSIS — M25512 Pain in left shoulder: Secondary | ICD-10-CM | POA: Diagnosis not present

## 2020-06-02 DIAGNOSIS — Z79899 Other long term (current) drug therapy: Secondary | ICD-10-CM | POA: Diagnosis not present

## 2020-06-02 DIAGNOSIS — G8929 Other chronic pain: Secondary | ICD-10-CM | POA: Diagnosis not present

## 2020-06-02 DIAGNOSIS — C641 Malignant neoplasm of right kidney, except renal pelvis: Secondary | ICD-10-CM | POA: Diagnosis not present

## 2020-06-02 DIAGNOSIS — M0579 Rheumatoid arthritis with rheumatoid factor of multiple sites without organ or systems involvement: Secondary | ICD-10-CM | POA: Diagnosis not present

## 2020-06-02 NOTE — Telephone Encounter (Signed)
Please review for refill. Thanks!  

## 2020-06-04 DIAGNOSIS — C641 Malignant neoplasm of right kidney, except renal pelvis: Secondary | ICD-10-CM | POA: Diagnosis not present

## 2020-06-04 DIAGNOSIS — R600 Localized edema: Secondary | ICD-10-CM | POA: Diagnosis not present

## 2020-06-04 DIAGNOSIS — E871 Hypo-osmolality and hyponatremia: Secondary | ICD-10-CM | POA: Diagnosis not present

## 2020-06-04 DIAGNOSIS — N182 Chronic kidney disease, stage 2 (mild): Secondary | ICD-10-CM | POA: Diagnosis not present

## 2020-06-04 DIAGNOSIS — D649 Anemia, unspecified: Secondary | ICD-10-CM | POA: Diagnosis not present

## 2020-06-15 DIAGNOSIS — D1801 Hemangioma of skin and subcutaneous tissue: Secondary | ICD-10-CM | POA: Diagnosis not present

## 2020-06-15 DIAGNOSIS — D485 Neoplasm of uncertain behavior of skin: Secondary | ICD-10-CM | POA: Diagnosis not present

## 2020-06-15 DIAGNOSIS — L538 Other specified erythematous conditions: Secondary | ICD-10-CM | POA: Diagnosis not present

## 2020-06-15 DIAGNOSIS — Z08 Encounter for follow-up examination after completed treatment for malignant neoplasm: Secondary | ICD-10-CM | POA: Diagnosis not present

## 2020-06-15 DIAGNOSIS — D044 Carcinoma in situ of skin of scalp and neck: Secondary | ICD-10-CM | POA: Diagnosis not present

## 2020-06-15 DIAGNOSIS — Z85828 Personal history of other malignant neoplasm of skin: Secondary | ICD-10-CM | POA: Diagnosis not present

## 2020-06-15 DIAGNOSIS — L218 Other seborrheic dermatitis: Secondary | ICD-10-CM | POA: Diagnosis not present

## 2020-06-15 DIAGNOSIS — L820 Inflamed seborrheic keratosis: Secondary | ICD-10-CM | POA: Diagnosis not present

## 2020-06-16 ENCOUNTER — Other Ambulatory Visit: Payer: Self-pay

## 2020-06-16 ENCOUNTER — Ambulatory Visit (INDEPENDENT_AMBULATORY_CARE_PROVIDER_SITE_OTHER): Payer: Medicare PPO

## 2020-06-16 ENCOUNTER — Other Ambulatory Visit: Payer: Self-pay | Admitting: Internal Medicine

## 2020-06-16 DIAGNOSIS — J4550 Severe persistent asthma, uncomplicated: Secondary | ICD-10-CM | POA: Diagnosis not present

## 2020-06-16 MED ORDER — OMALIZUMAB 75 MG/0.5ML ~~LOC~~ SOSY
75.0000 mg | PREFILLED_SYRINGE | Freq: Once | SUBCUTANEOUS | Status: AC
  Administered 2020-06-16: 75 mg via SUBCUTANEOUS

## 2020-06-16 MED ORDER — OMALIZUMAB 150 MG/ML ~~LOC~~ SOSY
300.0000 mg | PREFILLED_SYRINGE | Freq: Once | SUBCUTANEOUS | Status: AC
  Administered 2020-06-16: 300 mg via SUBCUTANEOUS

## 2020-06-16 NOTE — Progress Notes (Signed)
Have you been hospitalized within the last 10 days?  No Do you have a fever?  No Do you have a cough?  No Do you have a headache or sore throat? No Do you have your Epi Pen visible and is it within date?  Yes 

## 2020-06-16 NOTE — Telephone Encounter (Signed)
Last filled 04-29-20 @90  Last OV 05-11-20 No Future OV [facility] [address] [facility] to [employer] in Dr Alla German absence

## 2020-06-25 ENCOUNTER — Other Ambulatory Visit: Payer: Self-pay

## 2020-06-25 ENCOUNTER — Encounter: Payer: Self-pay | Admitting: Physician Assistant

## 2020-06-25 ENCOUNTER — Ambulatory Visit (INDEPENDENT_AMBULATORY_CARE_PROVIDER_SITE_OTHER): Payer: Medicare PPO | Admitting: Physician Assistant

## 2020-06-25 VITALS — BP 93/56 | HR 66 | Ht 72.0 in | Wt 199.0 lb

## 2020-06-25 DIAGNOSIS — N319 Neuromuscular dysfunction of bladder, unspecified: Secondary | ICD-10-CM

## 2020-06-25 NOTE — Progress Notes (Signed)
Cath Change/ Replacement  Patient is present today for a catheter change due to urinary retention.  50ml of water was removed from the balloon, a 16FR foley cath was removed without difficulty.  Patient was cleaned and prepped in a sterile fashion with betadine and 2% lidocaine jelly was instilled into the urethra. A 16 FR foley cath was replaced into the bladder no complications were noted Urine return was noted 58ml and urine was yellow in color. The balloon was filled with 4ml of sterile water. Catheter was attached to the patient's leg bag in place; he declined additional supplies.  Performed by: Debroah Loop, PA-C   Follow up: Return in about 4 weeks (around 07/23/2020) for Catheter exchange.

## 2020-07-01 ENCOUNTER — Other Ambulatory Visit: Payer: Self-pay

## 2020-07-01 ENCOUNTER — Ambulatory Visit (INDEPENDENT_AMBULATORY_CARE_PROVIDER_SITE_OTHER): Payer: Medicare PPO

## 2020-07-01 DIAGNOSIS — J4550 Severe persistent asthma, uncomplicated: Secondary | ICD-10-CM

## 2020-07-01 MED ORDER — OMALIZUMAB 75 MG/0.5ML ~~LOC~~ SOSY
75.0000 mg | PREFILLED_SYRINGE | Freq: Once | SUBCUTANEOUS | Status: AC
Start: 2020-07-01 — End: 2020-07-01
  Administered 2020-07-01: 75 mg via SUBCUTANEOUS

## 2020-07-01 MED ORDER — OMALIZUMAB 150 MG/ML ~~LOC~~ SOSY
300.0000 mg | PREFILLED_SYRINGE | Freq: Once | SUBCUTANEOUS | Status: AC
  Administered 2020-07-01: 300 mg via SUBCUTANEOUS

## 2020-07-01 NOTE — Progress Notes (Signed)
All questions were answered by the patient before medication was administered. Have you been hospitalized in the last 10 days? No Do you have a fever? No Do you have a cough? No Do you have a headache or sore throat? No  

## 2020-07-02 ENCOUNTER — Other Ambulatory Visit: Payer: Self-pay | Admitting: Family

## 2020-07-06 ENCOUNTER — Telehealth: Payer: Self-pay | Admitting: Pulmonary Disease

## 2020-07-06 NOTE — Telephone Encounter (Signed)
Xolair Prefilled Syringe Order: 150mg  Prefilled Syringe:  #4 75mg  Prefilled Syringe: #2 Ordered Date: 07/06/2020 Expected date of arrival: 07/07/2020 Ordered by: Desmond Dike, [place]  Specialty Pharmacy[facility]

## 2020-07-07 NOTE — Telephone Encounter (Signed)
Xolair Prefilled Syringe Received:  150mg  Prefilled Syringe >> quantity #4, lot # [id number], exp date 04/2021 75mg  Prefilled Syringe >> quantity #2, lot # [id number], exp date 07/2021 Medication arrival date: 07/07/2020 Received by: Desmond Dike, [place]

## 2020-07-09 DIAGNOSIS — Z96653 Presence of artificial knee joint, bilateral: Secondary | ICD-10-CM | POA: Diagnosis not present

## 2020-07-12 DIAGNOSIS — I4819 Other persistent atrial fibrillation: Secondary | ICD-10-CM

## 2020-07-12 DIAGNOSIS — J329 Chronic sinusitis, unspecified: Secondary | ICD-10-CM | POA: Insufficient documentation

## 2020-07-16 DIAGNOSIS — H539 Unspecified visual disturbance: Secondary | ICD-10-CM | POA: Diagnosis not present

## 2020-07-16 DIAGNOSIS — Z8673 Personal history of transient ischemic attack (TIA), and cerebral infarction without residual deficits: Secondary | ICD-10-CM | POA: Diagnosis not present

## 2020-07-16 DIAGNOSIS — M5489 Other dorsalgia: Secondary | ICD-10-CM | POA: Diagnosis not present

## 2020-07-17 ENCOUNTER — Ambulatory Visit (INDEPENDENT_AMBULATORY_CARE_PROVIDER_SITE_OTHER): Payer: Medicare PPO

## 2020-07-17 ENCOUNTER — Other Ambulatory Visit: Payer: Self-pay

## 2020-07-17 DIAGNOSIS — J4550 Severe persistent asthma, uncomplicated: Secondary | ICD-10-CM | POA: Diagnosis not present

## 2020-07-17 MED ORDER — OMALIZUMAB 150 MG/ML ~~LOC~~ SOSY
300.0000 mg | PREFILLED_SYRINGE | Freq: Once | SUBCUTANEOUS | Status: AC
Start: 2020-07-17 — End: 2020-07-17
  Administered 2020-07-17: 300 mg via SUBCUTANEOUS

## 2020-07-17 MED ORDER — OMALIZUMAB 75 MG/0.5ML ~~LOC~~ SOSY
75.0000 mg | PREFILLED_SYRINGE | Freq: Once | SUBCUTANEOUS | Status: AC
Start: 2020-07-17 — End: 2020-07-17
  Administered 2020-07-17: 75 mg via SUBCUTANEOUS

## 2020-07-18 DIAGNOSIS — M5489 Other dorsalgia: Secondary | ICD-10-CM | POA: Insufficient documentation

## 2020-07-20 DIAGNOSIS — M05712 Rheumatoid arthritis with rheumatoid factor of left shoulder without organ or systems involvement: Secondary | ICD-10-CM | POA: Diagnosis not present

## 2020-07-20 DIAGNOSIS — M05711 Rheumatoid arthritis with rheumatoid factor of right shoulder without organ or systems involvement: Secondary | ICD-10-CM | POA: Diagnosis not present

## 2020-07-20 DIAGNOSIS — M05741 Rheumatoid arthritis with rheumatoid factor of right hand without organ or systems involvement: Secondary | ICD-10-CM | POA: Diagnosis not present

## 2020-07-20 DIAGNOSIS — I10 Essential (primary) hypertension: Secondary | ICD-10-CM | POA: Diagnosis not present

## 2020-07-20 DIAGNOSIS — G8929 Other chronic pain: Secondary | ICD-10-CM | POA: Diagnosis not present

## 2020-07-20 DIAGNOSIS — M05742 Rheumatoid arthritis with rheumatoid factor of left hand without organ or systems involvement: Secondary | ICD-10-CM | POA: Diagnosis not present

## 2020-07-20 DIAGNOSIS — I2510 Atherosclerotic heart disease of native coronary artery without angina pectoris: Secondary | ICD-10-CM | POA: Diagnosis not present

## 2020-07-20 DIAGNOSIS — G629 Polyneuropathy, unspecified: Secondary | ICD-10-CM | POA: Diagnosis not present

## 2020-07-20 DIAGNOSIS — J449 Chronic obstructive pulmonary disease, unspecified: Secondary | ICD-10-CM | POA: Diagnosis not present

## 2020-07-23 ENCOUNTER — Other Ambulatory Visit: Payer: Self-pay

## 2020-07-23 ENCOUNTER — Ambulatory Visit (INDEPENDENT_AMBULATORY_CARE_PROVIDER_SITE_OTHER): Payer: Medicare PPO | Admitting: Physician Assistant

## 2020-07-23 ENCOUNTER — Encounter: Payer: Self-pay | Admitting: Physician Assistant

## 2020-07-23 VITALS — BP 111/71 | HR 59 | Ht 72.0 in | Wt 199.0 lb

## 2020-07-23 DIAGNOSIS — N319 Neuromuscular dysfunction of bladder, unspecified: Secondary | ICD-10-CM | POA: Diagnosis not present

## 2020-07-23 NOTE — Progress Notes (Signed)
Cath Change/ Replacement  Patient is present today for a catheter change due to urinary retention.  3ml of water was removed from the balloon, a 16FR foley cath was removed without difficulty.  Patient was cleaned and prepped in a sterile fashion with betadine and 2% lidocaine jelly was instilled into the urethra. A 16 FR foley cath was replaced into the bladder no complications were noted Urine return was noted 1ml and urine was yellow in color. The balloon was filled with 72ml of sterile water. Patient's leg bag was attached for drainage.    Performed by: Debroah Loop, PA-C   Follow up: Return in about 4 weeks (around 08/20/2020) for Catheter exchange.

## 2020-07-24 DIAGNOSIS — D0439 Carcinoma in situ of skin of other parts of face: Secondary | ICD-10-CM | POA: Diagnosis not present

## 2020-07-27 DIAGNOSIS — M05741 Rheumatoid arthritis with rheumatoid factor of right hand without organ or systems involvement: Secondary | ICD-10-CM | POA: Diagnosis not present

## 2020-07-27 DIAGNOSIS — M05742 Rheumatoid arthritis with rheumatoid factor of left hand without organ or systems involvement: Secondary | ICD-10-CM | POA: Diagnosis not present

## 2020-07-27 DIAGNOSIS — I2510 Atherosclerotic heart disease of native coronary artery without angina pectoris: Secondary | ICD-10-CM | POA: Diagnosis not present

## 2020-07-27 DIAGNOSIS — M05712 Rheumatoid arthritis with rheumatoid factor of left shoulder without organ or systems involvement: Secondary | ICD-10-CM | POA: Diagnosis not present

## 2020-07-27 DIAGNOSIS — I10 Essential (primary) hypertension: Secondary | ICD-10-CM | POA: Diagnosis not present

## 2020-07-27 DIAGNOSIS — J449 Chronic obstructive pulmonary disease, unspecified: Secondary | ICD-10-CM | POA: Diagnosis not present

## 2020-07-27 DIAGNOSIS — G8929 Other chronic pain: Secondary | ICD-10-CM | POA: Diagnosis not present

## 2020-07-27 DIAGNOSIS — M05711 Rheumatoid arthritis with rheumatoid factor of right shoulder without organ or systems involvement: Secondary | ICD-10-CM | POA: Diagnosis not present

## 2020-07-27 DIAGNOSIS — G629 Polyneuropathy, unspecified: Secondary | ICD-10-CM | POA: Diagnosis not present

## 2020-07-30 ENCOUNTER — Other Ambulatory Visit: Payer: Self-pay | Admitting: Internal Medicine

## 2020-07-30 DIAGNOSIS — I10 Essential (primary) hypertension: Secondary | ICD-10-CM | POA: Diagnosis not present

## 2020-07-30 DIAGNOSIS — J449 Chronic obstructive pulmonary disease, unspecified: Secondary | ICD-10-CM | POA: Diagnosis not present

## 2020-07-30 DIAGNOSIS — G8929 Other chronic pain: Secondary | ICD-10-CM | POA: Diagnosis not present

## 2020-07-30 DIAGNOSIS — M05742 Rheumatoid arthritis with rheumatoid factor of left hand without organ or systems involvement: Secondary | ICD-10-CM | POA: Diagnosis not present

## 2020-07-30 DIAGNOSIS — I2510 Atherosclerotic heart disease of native coronary artery without angina pectoris: Secondary | ICD-10-CM | POA: Diagnosis not present

## 2020-07-30 DIAGNOSIS — G629 Polyneuropathy, unspecified: Secondary | ICD-10-CM | POA: Diagnosis not present

## 2020-07-30 DIAGNOSIS — M05711 Rheumatoid arthritis with rheumatoid factor of right shoulder without organ or systems involvement: Secondary | ICD-10-CM | POA: Diagnosis not present

## 2020-07-30 DIAGNOSIS — M05712 Rheumatoid arthritis with rheumatoid factor of left shoulder without organ or systems involvement: Secondary | ICD-10-CM | POA: Diagnosis not present

## 2020-07-30 DIAGNOSIS — M05741 Rheumatoid arthritis with rheumatoid factor of right hand without organ or systems involvement: Secondary | ICD-10-CM | POA: Diagnosis not present

## 2020-07-31 ENCOUNTER — Ambulatory Visit (INDEPENDENT_AMBULATORY_CARE_PROVIDER_SITE_OTHER): Payer: Medicare PPO

## 2020-07-31 ENCOUNTER — Other Ambulatory Visit: Payer: Self-pay

## 2020-07-31 DIAGNOSIS — J4550 Severe persistent asthma, uncomplicated: Secondary | ICD-10-CM | POA: Diagnosis not present

## 2020-07-31 MED ORDER — OMALIZUMAB 75 MG/0.5ML ~~LOC~~ SOSY
75.0000 mg | PREFILLED_SYRINGE | Freq: Once | SUBCUTANEOUS | Status: AC
  Administered 2020-07-31: 75 mg via SUBCUTANEOUS

## 2020-07-31 MED ORDER — OMALIZUMAB 150 MG/ML ~~LOC~~ SOSY
300.0000 mg | PREFILLED_SYRINGE | Freq: Once | SUBCUTANEOUS | Status: AC
  Administered 2020-07-31: 300 mg via SUBCUTANEOUS

## 2020-07-31 NOTE — Progress Notes (Signed)
Have you been hospitalized within the last 10 days?  No Do you have a fever?  No Do you have a cough?  No Do you have a headache or sore throat? No Do you have your Epi Pen visible and is it within date?  Yes 

## 2020-07-31 NOTE — Telephone Encounter (Signed)
[place] pt  Last filled 06-16-20 #90 Last OV 05-11-20 No Future OV [facility] [address] [facility]

## 2020-08-04 ENCOUNTER — Telehealth: Payer: Self-pay | Admitting: Pulmonary Disease

## 2020-08-04 DIAGNOSIS — M05711 Rheumatoid arthritis with rheumatoid factor of right shoulder without organ or systems involvement: Secondary | ICD-10-CM | POA: Diagnosis not present

## 2020-08-04 DIAGNOSIS — J449 Chronic obstructive pulmonary disease, unspecified: Secondary | ICD-10-CM | POA: Diagnosis not present

## 2020-08-04 DIAGNOSIS — M05741 Rheumatoid arthritis with rheumatoid factor of right hand without organ or systems involvement: Secondary | ICD-10-CM | POA: Diagnosis not present

## 2020-08-04 DIAGNOSIS — M05712 Rheumatoid arthritis with rheumatoid factor of left shoulder without organ or systems involvement: Secondary | ICD-10-CM | POA: Diagnosis not present

## 2020-08-04 DIAGNOSIS — I2510 Atherosclerotic heart disease of native coronary artery without angina pectoris: Secondary | ICD-10-CM | POA: Diagnosis not present

## 2020-08-04 DIAGNOSIS — H6123 Impacted cerumen, bilateral: Secondary | ICD-10-CM | POA: Diagnosis not present

## 2020-08-04 DIAGNOSIS — I10 Essential (primary) hypertension: Secondary | ICD-10-CM | POA: Diagnosis not present

## 2020-08-04 DIAGNOSIS — G8929 Other chronic pain: Secondary | ICD-10-CM | POA: Diagnosis not present

## 2020-08-04 DIAGNOSIS — H903 Sensorineural hearing loss, bilateral: Secondary | ICD-10-CM | POA: Diagnosis not present

## 2020-08-04 DIAGNOSIS — M05742 Rheumatoid arthritis with rheumatoid factor of left hand without organ or systems involvement: Secondary | ICD-10-CM | POA: Diagnosis not present

## 2020-08-04 DIAGNOSIS — G629 Polyneuropathy, unspecified: Secondary | ICD-10-CM | POA: Diagnosis not present

## 2020-08-04 NOTE — Telephone Encounter (Signed)
Xolair Prefilled Syringe Order: 150mg  Prefilled Syringe:  #4 75mg  Prefilled Syringe: #2 Ordered Date: 08/04/20 Expected date of arrival: 9/8-08/06/20 Ordered by: Grantham Hippert,LPN Specialty [facility]

## 2020-08-05 NOTE — Telephone Encounter (Signed)
Xolair Prefilled Syringe Received:  150mg  Prefilled Syringe >> quantity #4, lot # [id number], exp date 07/28/2021 75mg  Prefilled Syringe >> quantity #2, lot # [id number], exp date 08/27/2021 Medication arrival date: 08/05/20 Received by: Rachella Basden,LPN

## 2020-08-07 DIAGNOSIS — G629 Polyneuropathy, unspecified: Secondary | ICD-10-CM | POA: Diagnosis not present

## 2020-08-07 DIAGNOSIS — M05741 Rheumatoid arthritis with rheumatoid factor of right hand without organ or systems involvement: Secondary | ICD-10-CM | POA: Diagnosis not present

## 2020-08-07 DIAGNOSIS — M05711 Rheumatoid arthritis with rheumatoid factor of right shoulder without organ or systems involvement: Secondary | ICD-10-CM | POA: Diagnosis not present

## 2020-08-07 DIAGNOSIS — J449 Chronic obstructive pulmonary disease, unspecified: Secondary | ICD-10-CM | POA: Diagnosis not present

## 2020-08-07 DIAGNOSIS — I10 Essential (primary) hypertension: Secondary | ICD-10-CM | POA: Diagnosis not present

## 2020-08-07 DIAGNOSIS — M05742 Rheumatoid arthritis with rheumatoid factor of left hand without organ or systems involvement: Secondary | ICD-10-CM | POA: Diagnosis not present

## 2020-08-07 DIAGNOSIS — I2510 Atherosclerotic heart disease of native coronary artery without angina pectoris: Secondary | ICD-10-CM | POA: Diagnosis not present

## 2020-08-07 DIAGNOSIS — G8929 Other chronic pain: Secondary | ICD-10-CM | POA: Diagnosis not present

## 2020-08-07 DIAGNOSIS — M05712 Rheumatoid arthritis with rheumatoid factor of left shoulder without organ or systems involvement: Secondary | ICD-10-CM | POA: Diagnosis not present

## 2020-08-10 DIAGNOSIS — I10 Essential (primary) hypertension: Secondary | ICD-10-CM | POA: Diagnosis not present

## 2020-08-10 DIAGNOSIS — M05742 Rheumatoid arthritis with rheumatoid factor of left hand without organ or systems involvement: Secondary | ICD-10-CM | POA: Diagnosis not present

## 2020-08-10 DIAGNOSIS — M05741 Rheumatoid arthritis with rheumatoid factor of right hand without organ or systems involvement: Secondary | ICD-10-CM | POA: Diagnosis not present

## 2020-08-10 DIAGNOSIS — J449 Chronic obstructive pulmonary disease, unspecified: Secondary | ICD-10-CM | POA: Diagnosis not present

## 2020-08-10 DIAGNOSIS — I2510 Atherosclerotic heart disease of native coronary artery without angina pectoris: Secondary | ICD-10-CM | POA: Diagnosis not present

## 2020-08-10 DIAGNOSIS — M05711 Rheumatoid arthritis with rheumatoid factor of right shoulder without organ or systems involvement: Secondary | ICD-10-CM | POA: Diagnosis not present

## 2020-08-10 DIAGNOSIS — G8929 Other chronic pain: Secondary | ICD-10-CM | POA: Diagnosis not present

## 2020-08-10 DIAGNOSIS — M05712 Rheumatoid arthritis with rheumatoid factor of left shoulder without organ or systems involvement: Secondary | ICD-10-CM | POA: Diagnosis not present

## 2020-08-10 DIAGNOSIS — G629 Polyneuropathy, unspecified: Secondary | ICD-10-CM | POA: Diagnosis not present

## 2020-08-12 DIAGNOSIS — I10 Essential (primary) hypertension: Secondary | ICD-10-CM | POA: Diagnosis not present

## 2020-08-12 DIAGNOSIS — M05742 Rheumatoid arthritis with rheumatoid factor of left hand without organ or systems involvement: Secondary | ICD-10-CM | POA: Diagnosis not present

## 2020-08-12 DIAGNOSIS — G629 Polyneuropathy, unspecified: Secondary | ICD-10-CM | POA: Diagnosis not present

## 2020-08-12 DIAGNOSIS — M05711 Rheumatoid arthritis with rheumatoid factor of right shoulder without organ or systems involvement: Secondary | ICD-10-CM | POA: Diagnosis not present

## 2020-08-12 DIAGNOSIS — G8929 Other chronic pain: Secondary | ICD-10-CM | POA: Diagnosis not present

## 2020-08-12 DIAGNOSIS — J449 Chronic obstructive pulmonary disease, unspecified: Secondary | ICD-10-CM | POA: Diagnosis not present

## 2020-08-12 DIAGNOSIS — M05712 Rheumatoid arthritis with rheumatoid factor of left shoulder without organ or systems involvement: Secondary | ICD-10-CM | POA: Diagnosis not present

## 2020-08-12 DIAGNOSIS — M05741 Rheumatoid arthritis with rheumatoid factor of right hand without organ or systems involvement: Secondary | ICD-10-CM | POA: Diagnosis not present

## 2020-08-12 DIAGNOSIS — I2510 Atherosclerotic heart disease of native coronary artery without angina pectoris: Secondary | ICD-10-CM | POA: Diagnosis not present

## 2020-08-14 ENCOUNTER — Ambulatory Visit (INDEPENDENT_AMBULATORY_CARE_PROVIDER_SITE_OTHER): Payer: Medicare PPO

## 2020-08-14 ENCOUNTER — Other Ambulatory Visit: Payer: Self-pay

## 2020-08-14 DIAGNOSIS — J4550 Severe persistent asthma, uncomplicated: Secondary | ICD-10-CM

## 2020-08-14 MED ORDER — OMALIZUMAB 150 MG/ML ~~LOC~~ SOSY
300.0000 mg | PREFILLED_SYRINGE | Freq: Once | SUBCUTANEOUS | Status: AC
  Administered 2020-08-14: 300 mg via SUBCUTANEOUS

## 2020-08-14 MED ORDER — OMALIZUMAB 75 MG/0.5ML ~~LOC~~ SOSY
75.0000 mg | PREFILLED_SYRINGE | Freq: Once | SUBCUTANEOUS | Status: AC
  Administered 2020-08-14: 75 mg via SUBCUTANEOUS

## 2020-08-14 NOTE — Progress Notes (Signed)
Have you been hospitalized within the last 10 days?  No Do you have a fever?  No Do you have a cough?  No Do you have a headache or sore throat? No Do you have your Epi Pen visible and is it within date?  Yes 

## 2020-08-17 DIAGNOSIS — M05711 Rheumatoid arthritis with rheumatoid factor of right shoulder without organ or systems involvement: Secondary | ICD-10-CM | POA: Diagnosis not present

## 2020-08-17 DIAGNOSIS — I2510 Atherosclerotic heart disease of native coronary artery without angina pectoris: Secondary | ICD-10-CM | POA: Diagnosis not present

## 2020-08-17 DIAGNOSIS — M05742 Rheumatoid arthritis with rheumatoid factor of left hand without organ or systems involvement: Secondary | ICD-10-CM | POA: Diagnosis not present

## 2020-08-17 DIAGNOSIS — G629 Polyneuropathy, unspecified: Secondary | ICD-10-CM | POA: Diagnosis not present

## 2020-08-17 DIAGNOSIS — G8929 Other chronic pain: Secondary | ICD-10-CM | POA: Diagnosis not present

## 2020-08-17 DIAGNOSIS — I10 Essential (primary) hypertension: Secondary | ICD-10-CM | POA: Diagnosis not present

## 2020-08-17 DIAGNOSIS — M05741 Rheumatoid arthritis with rheumatoid factor of right hand without organ or systems involvement: Secondary | ICD-10-CM | POA: Diagnosis not present

## 2020-08-17 DIAGNOSIS — M05712 Rheumatoid arthritis with rheumatoid factor of left shoulder without organ or systems involvement: Secondary | ICD-10-CM | POA: Diagnosis not present

## 2020-08-17 DIAGNOSIS — J449 Chronic obstructive pulmonary disease, unspecified: Secondary | ICD-10-CM | POA: Diagnosis not present

## 2020-08-20 ENCOUNTER — Other Ambulatory Visit: Payer: Self-pay

## 2020-08-20 ENCOUNTER — Ambulatory Visit (INDEPENDENT_AMBULATORY_CARE_PROVIDER_SITE_OTHER): Payer: Medicare PPO | Admitting: Physician Assistant

## 2020-08-20 ENCOUNTER — Encounter: Payer: Self-pay | Admitting: Physician Assistant

## 2020-08-20 VITALS — BP 111/68 | HR 61 | Ht 69.0 in | Wt 199.0 lb

## 2020-08-20 DIAGNOSIS — N319 Neuromuscular dysfunction of bladder, unspecified: Secondary | ICD-10-CM

## 2020-08-20 NOTE — Progress Notes (Signed)
Cath Change/ Replacement  Patient is present today for a catheter change due to urinary retention.  30ml of water was removed from the balloon, a 16FR foley cath was removed without difficulty.  Patient was cleaned and prepped in a sterile fashion with betadine and 2% lidocaine jelly was instilled into the urethra. A 16 FR foley cath was replaced into the bladder no complications were noted Urine return was noted 55ml and urine was yellow in color. The balloon was filled with 25ml of sterile water and attached to the patient's leg bag in place.     Performed by: Debroah Loop, PA-C   Follow up: Return in about 4 weeks (around 09/17/2020) for Catheter exchange.

## 2020-08-24 ENCOUNTER — Encounter: Payer: Self-pay | Admitting: Pulmonary Disease

## 2020-08-24 ENCOUNTER — Ambulatory Visit: Payer: Medicare PPO | Admitting: Pulmonary Disease

## 2020-08-24 ENCOUNTER — Other Ambulatory Visit: Payer: Self-pay

## 2020-08-24 VITALS — BP 118/78 | HR 69 | Temp 98.0°F | Ht 69.0 in | Wt 201.0 lb

## 2020-08-24 DIAGNOSIS — J4550 Severe persistent asthma, uncomplicated: Secondary | ICD-10-CM | POA: Diagnosis not present

## 2020-08-24 DIAGNOSIS — Z23 Encounter for immunization: Secondary | ICD-10-CM

## 2020-08-24 NOTE — Progress Notes (Signed)
[facility]  Chief Complaint  Patient presents with   Follow-up    pt states no compliants    Constitutional:  BP 118/78 (BP Location: Left Arm, Cuff Size: Normal)    Pulse 69    Temp 98 F (36.7 C) (Oral)    Ht 5\' 9"  (1.753 m)    Wt 201 lb (91.2 kg)    SpO2 96%    BMI 29.68 kg/m   Past Medical History:  Rheumatoid arthritis, OA, Neurogenic bladder, IBS, Hyponatremia, HTN, Colon polyps, GERD, ED, Diverticulitis, Depression, Prostate cancer, CAD, Anxiety, A fib  Past Surgical History:  His  has a past surgical history that includes Prostate surgery; Nasal sinus surgery (2009); Urethra dilation (06-997); Total knee arthroplasty (Left, 9/15); Knee Arthroplasty (Right, 09/02/2015); permanent indwelling catheter; Cardioversion (N/A, 04/23/2019); and Laparoscopic nephrectomy, hand assisted (Right, 06/17/2019).  Brief Summary:  [name] is a 86 y.o. male with allergic asthma and rhinitis.      Subjective:   He had squamous cell cancer removed from his scalp.  Still healing up.  Had chest xray in February 2021 - showed calcified granuloma.  Not having cough, wheeze, chest congestion.  Main issue is knee pain limiting his mobility.  He has been working with PT.  Renal function has been stable.  Physical Exam:   Appearance - well kempt   ENMT - no sinus tenderness, no oral exudate, no LAN, Mallampati 2 airway, no stridor  Respiratory - equal breath sounds bilaterally, no wheezing or rales  CV - s1s2 regular rate and rhythm, no murmurs  Ext - no clubbing, no edema, using a walker  Skin - no rashes  Psych - normal mood and affect   Pulmonary testing:   PFT 04/07/10 >> FEV1 2.61 (86%), FEV1% 73, TLC 7.26 (85%), TLC 112%  Chest Imaging:   CT chest 03/15/17 > atherosclerosis, 3.4 cm Lt lung base density rounded ATX, granuloma in lingula, 3 mm RLL nodule, 4 mm RLL nodule  CT chest 09/18/17 >>atherosclerosis, rounded ATX LLL,  stable 4 mm RLL nodule, calcified granuloma in lingula, stable 4 mm RLL nodule  Cardiac Tests:   Echo 04/28/20 >> EF 60 to 65%, mod LVH, grade 2 DD, mild AS, aortic root 42 mm  Social History:  He  reports that he has never smoked. He has never used smokeless tobacco. He reports current alcohol use. He reports that he does not use drugs.  Family History:  His family history includes Heart disease (age of onset: 27) in his mother; Pneumonia (age of onset: 65) in his father; Skin cancer in his father, sister, and son.     Assessment/Plan:   Allergic asthma and rhinitis. - well controlled with xolair >> he gets injection in his hips - continue nasal irrigation - prn flonase, albuterol - high dose influenza vaccine today  Persistent atrial fibrillation, chronic diastolic CHF, aortic stenosis. - followed by Dr. Fletcher Anon with [facility]  Rheumatoid arthritis. - positive RF, anti CCP - followed by Dr. Jefm Bryant with rheumatology  Advice about COVID 19. - he will get his COVID booster in a few weeks  Time Spent Involved in Patient Care on Day of Examination:  24 minutes  Follow up:  Patient Instructions  Follow up in 6 months   Medication List:   Allergies as of 08/24/2020      Reactions   Ciprofloxacin Nausea And Vomiting   Headache   Citalopram Hydrobromide Other (See Comments)   "  unknown"   Clindamycin Nausea And Vomiting   Lorazepam Other (See Comments)   Adverse reaction   Paroxetine Nausea Only   Ramipril Other (See Comments)   "unknown"   Simvastatin    Sulfa Antibiotics Other (See Comments)      Medication List       Accurate as of August 24, 2020  4:14 PM. If you have any questions, ask your nurse or doctor.        acetaminophen 500 MG tablet Commonly known as: TYLENOL Take 500 mg by mouth every 8 (eight) hours as needed for moderate pain or headache.   albuterol 108 (90 Base) MCG/ACT inhaler Commonly known as: VENTOLIN HFA Inhale 2 puffs  into the lungs every 6 (six) hours as needed for wheezing or shortness of breath.   Align 4 MG Caps Take 1 capsule (4 mg total) by mouth daily.   ALOE PO Take by mouth.   ALPRAZolam 0.5 MG tablet Commonly known as: XANAX TAKE 1 TABLET BY MOUTH THREE TIMES DAILY AS NEEDED FOR ANXIETY OR SLEEP   Co Q 10 100 MG Caps Take 100 mg by mouth daily.   Eliquis 5 MG Tabs tablet Generic drug: apixaban TAKE 1 TABLET(5 MG) BY MOUTH TWICE DAILY   fluticasone 50 MCG/ACT nasal spray Commonly known as: FLONASE Place 2 sprays into both nostrils 2 (two) times daily.   folic acid 220 MCG tablet Commonly known as: FOLVITE Take 1,200 mcg by mouth daily.   furosemide 20 MG tablet Commonly known as: LASIX TAKE 1 TABLET(20 MG) BY MOUTH DAILY   gabapentin 300 MG capsule Commonly known as: NEURONTIN Take 1 capsule (300 mg total) by mouth 3 (three) times daily. What changed: when to take this   hydroxychloroquine 200 MG tablet Commonly known as: PLAQUENIL Take 200 mg by mouth 2 (two) times daily.   ketoconazole 2 % shampoo Commonly known as: NIZORAL   methotrexate 250 MG/10ML injection Inject 0.8 mLs into the skin once a week.   metoprolol tartrate 25 MG tablet Commonly known as: LOPRESSOR Take 0.5 tablets (12.5 mg total) by mouth 2 (two) times daily.   multivitamin tablet Take 1 tablet by mouth daily.   Myrbetriq 50 MG Tb24 tablet Generic drug: mirabegron ER TAKE 1 TABLET(50 MG) BY MOUTH DAILY   OIL OF OREGANO PO Take by mouth.   polyethylene glycol powder 17 GM/SCOOP powder Commonly known as: GLYCOLAX/MIRALAX Take 17 g by mouth at bedtime.   pravastatin 20 MG tablet Commonly known as: PRAVACHOL TAKE 1 TABLET(20 MG) BY MOUTH AT BEDTIME   Selenium 200 MCG Caps Take 200 mcg by mouth daily.   sodium chloride 1 g tablet Take 1 tablet (1 g total) by mouth 2 (two) times daily with a meal. What changed: when to take this   Turmeric 500 MG Caps Take 500 mg by mouth daily.    VITAMIN C PO Take by mouth.   Vitamin D3 125 MCG (5000 UT) Caps Take 5,000 Units by mouth daily.   Zinc 50 MG Tabs Take by mouth.       Signature:  Chesley Mires, MD [facility] Pager - [phone] 08/24/2020, 4:14 PM

## 2020-08-24 NOTE — Patient Instructions (Signed)
Follow up in 6 months 

## 2020-08-26 DIAGNOSIS — M05711 Rheumatoid arthritis with rheumatoid factor of right shoulder without organ or systems involvement: Secondary | ICD-10-CM | POA: Diagnosis not present

## 2020-08-26 DIAGNOSIS — M05741 Rheumatoid arthritis with rheumatoid factor of right hand without organ or systems involvement: Secondary | ICD-10-CM | POA: Diagnosis not present

## 2020-08-26 DIAGNOSIS — G8929 Other chronic pain: Secondary | ICD-10-CM | POA: Diagnosis not present

## 2020-08-26 DIAGNOSIS — I2510 Atherosclerotic heart disease of native coronary artery without angina pectoris: Secondary | ICD-10-CM | POA: Diagnosis not present

## 2020-08-26 DIAGNOSIS — I10 Essential (primary) hypertension: Secondary | ICD-10-CM | POA: Diagnosis not present

## 2020-08-26 DIAGNOSIS — M05742 Rheumatoid arthritis with rheumatoid factor of left hand without organ or systems involvement: Secondary | ICD-10-CM | POA: Diagnosis not present

## 2020-08-26 DIAGNOSIS — J449 Chronic obstructive pulmonary disease, unspecified: Secondary | ICD-10-CM | POA: Diagnosis not present

## 2020-08-26 DIAGNOSIS — M05712 Rheumatoid arthritis with rheumatoid factor of left shoulder without organ or systems involvement: Secondary | ICD-10-CM | POA: Diagnosis not present

## 2020-08-26 DIAGNOSIS — G629 Polyneuropathy, unspecified: Secondary | ICD-10-CM | POA: Diagnosis not present

## 2020-08-28 ENCOUNTER — Telehealth: Payer: Self-pay | Admitting: Pulmonary Disease

## 2020-08-28 ENCOUNTER — Ambulatory Visit: Payer: Medicare PPO

## 2020-08-28 NOTE — Telephone Encounter (Signed)
Spoke with patient's Daughter.  Patient rescheduled for 09/01/20, at 1530.

## 2020-09-01 ENCOUNTER — Other Ambulatory Visit: Payer: Self-pay

## 2020-09-01 ENCOUNTER — Ambulatory Visit (INDEPENDENT_AMBULATORY_CARE_PROVIDER_SITE_OTHER): Payer: Medicare PPO

## 2020-09-01 DIAGNOSIS — J4550 Severe persistent asthma, uncomplicated: Secondary | ICD-10-CM

## 2020-09-01 MED ORDER — OMALIZUMAB 75 MG/0.5ML ~~LOC~~ SOSY
75.0000 mg | PREFILLED_SYRINGE | Freq: Once | SUBCUTANEOUS | Status: AC
  Administered 2020-09-01: 75 mg via SUBCUTANEOUS

## 2020-09-01 MED ORDER — OMALIZUMAB 150 MG/ML ~~LOC~~ SOSY
300.0000 mg | PREFILLED_SYRINGE | Freq: Once | SUBCUTANEOUS | Status: AC
  Administered 2020-09-01: 300 mg via SUBCUTANEOUS

## 2020-09-01 NOTE — Progress Notes (Signed)
Have you been hospitalized within the last 10 days?  No Do you have a fever?  No Do you have a cough?  No Do you have a headache or sore throat? No Do you have your Epi Pen visible and is it within date?  Yes 

## 2020-09-02 DIAGNOSIS — G8929 Other chronic pain: Secondary | ICD-10-CM | POA: Diagnosis not present

## 2020-09-02 DIAGNOSIS — I10 Essential (primary) hypertension: Secondary | ICD-10-CM | POA: Diagnosis not present

## 2020-09-02 DIAGNOSIS — M05741 Rheumatoid arthritis with rheumatoid factor of right hand without organ or systems involvement: Secondary | ICD-10-CM | POA: Diagnosis not present

## 2020-09-02 DIAGNOSIS — M05711 Rheumatoid arthritis with rheumatoid factor of right shoulder without organ or systems involvement: Secondary | ICD-10-CM | POA: Diagnosis not present

## 2020-09-02 DIAGNOSIS — G629 Polyneuropathy, unspecified: Secondary | ICD-10-CM | POA: Diagnosis not present

## 2020-09-02 DIAGNOSIS — M05712 Rheumatoid arthritis with rheumatoid factor of left shoulder without organ or systems involvement: Secondary | ICD-10-CM | POA: Diagnosis not present

## 2020-09-02 DIAGNOSIS — M05742 Rheumatoid arthritis with rheumatoid factor of left hand without organ or systems involvement: Secondary | ICD-10-CM | POA: Diagnosis not present

## 2020-09-02 DIAGNOSIS — I2510 Atherosclerotic heart disease of native coronary artery without angina pectoris: Secondary | ICD-10-CM | POA: Diagnosis not present

## 2020-09-02 DIAGNOSIS — J449 Chronic obstructive pulmonary disease, unspecified: Secondary | ICD-10-CM | POA: Diagnosis not present

## 2020-09-03 DIAGNOSIS — M0579 Rheumatoid arthritis with rheumatoid factor of multiple sites without organ or systems involvement: Secondary | ICD-10-CM | POA: Diagnosis not present

## 2020-09-03 DIAGNOSIS — Z79899 Other long term (current) drug therapy: Secondary | ICD-10-CM | POA: Diagnosis not present

## 2020-09-07 DIAGNOSIS — Z79899 Other long term (current) drug therapy: Secondary | ICD-10-CM | POA: Diagnosis not present

## 2020-09-07 DIAGNOSIS — M0579 Rheumatoid arthritis with rheumatoid factor of multiple sites without organ or systems involvement: Secondary | ICD-10-CM | POA: Diagnosis not present

## 2020-09-07 DIAGNOSIS — G8929 Other chronic pain: Secondary | ICD-10-CM | POA: Diagnosis not present

## 2020-09-07 DIAGNOSIS — M545 Low back pain, unspecified: Secondary | ICD-10-CM | POA: Diagnosis not present

## 2020-09-07 DIAGNOSIS — D696 Thrombocytopenia, unspecified: Secondary | ICD-10-CM | POA: Diagnosis not present

## 2020-09-07 DIAGNOSIS — C641 Malignant neoplasm of right kidney, except renal pelvis: Secondary | ICD-10-CM | POA: Diagnosis not present

## 2020-09-10 ENCOUNTER — Other Ambulatory Visit: Payer: Self-pay | Admitting: Internal Medicine

## 2020-09-14 ENCOUNTER — Telehealth: Payer: Self-pay | Admitting: Pulmonary Disease

## 2020-09-14 NOTE — Telephone Encounter (Signed)
Xolair Prefilled Syringe Order: 150mg  Prefilled Syringe:  #4 75mg  Prefilled Syringe: #2 Ordered Date: 09/14/20 Expected date of arrival: 09/15/20 Ordered by: [place]: [facility]

## 2020-09-15 ENCOUNTER — Ambulatory Visit: Payer: Medicare PPO

## 2020-09-15 NOTE — Telephone Encounter (Signed)
Last filled 07-31-2020 #90 Last OV 05-11-20 No Future OV [facility] [address] [facility]

## 2020-09-15 NOTE — Telephone Encounter (Signed)
Xolair Prefilled Syringe Received:  150mg  Prefilled Syringe >> quantity #4, lot # [id number], exp date 10/27/2021 75mg  Prefilled Syringe >> quantity #2, lot # [id number], exp date 10/27/2021 Medication arrival date: 09/15/20 Received by: Yvonne Stopher,LPN

## 2020-09-16 ENCOUNTER — Other Ambulatory Visit: Payer: Self-pay | Admitting: Rheumatology

## 2020-09-16 DIAGNOSIS — G8929 Other chronic pain: Secondary | ICD-10-CM

## 2020-09-17 ENCOUNTER — Ambulatory Visit: Payer: Self-pay | Admitting: Physician Assistant

## 2020-09-18 ENCOUNTER — Ambulatory Visit (INDEPENDENT_AMBULATORY_CARE_PROVIDER_SITE_OTHER): Payer: Medicare PPO | Admitting: Physician Assistant

## 2020-09-18 ENCOUNTER — Other Ambulatory Visit: Payer: Self-pay

## 2020-09-18 DIAGNOSIS — N319 Neuromuscular dysfunction of bladder, unspecified: Secondary | ICD-10-CM | POA: Diagnosis not present

## 2020-09-18 NOTE — Progress Notes (Signed)
Cath Change/ Replacement  Patient is present today for a catheter change due to urinary retention.  36ml of water was removed from the balloon, a 16FR foley cath was removed without difficulty.  Patient was cleaned and prepped in a sterile fashion with betadine and 2% lidocaine jelly was instilled into the urethra. A 16 FR foley cath was replaced into the bladder no complications were noted Urine return was noted 79ml and urine was yellow in color. The balloon was filled with 51ml of sterile water. A leg bag was attached for drainage.      Performed by: Debroah Loop, PA-C, Gaspar Cola, CMA, and Fonnie Jarvis, CMA  Follow up: Return in about 4 weeks (around 10/16/2020) for Catheter exchange.

## 2020-09-19 ENCOUNTER — Other Ambulatory Visit: Payer: Self-pay | Admitting: Urology

## 2020-09-20 ENCOUNTER — Ambulatory Visit
Admission: RE | Admit: 2020-09-20 | Discharge: 2020-09-20 | Disposition: A | Discharge status: Home / Self Care | Payer: Medicare PPO | Source: Ambulatory Visit | Attending: Rheumatology | Admitting: Rheumatology

## 2020-09-20 ENCOUNTER — Ambulatory Visit: Payer: Medicare PPO

## 2020-09-20 DIAGNOSIS — G8929 Other chronic pain: Secondary | ICD-10-CM | POA: Diagnosis not present

## 2020-09-20 DIAGNOSIS — M545 Low back pain, unspecified: Secondary | ICD-10-CM | POA: Diagnosis not present

## 2020-09-22 ENCOUNTER — Ambulatory Visit (INDEPENDENT_AMBULATORY_CARE_PROVIDER_SITE_OTHER): Payer: Medicare PPO

## 2020-09-22 ENCOUNTER — Other Ambulatory Visit: Payer: Self-pay

## 2020-09-22 DIAGNOSIS — J4550 Severe persistent asthma, uncomplicated: Secondary | ICD-10-CM | POA: Diagnosis not present

## 2020-09-22 MED ORDER — OMALIZUMAB 150 MG/ML ~~LOC~~ SOSY
300.0000 mg | PREFILLED_SYRINGE | Freq: Once | SUBCUTANEOUS | Status: AC
  Administered 2020-09-22: 300 mg via SUBCUTANEOUS

## 2020-09-22 MED ORDER — OMALIZUMAB 75 MG/0.5ML ~~LOC~~ SOSY
75.0000 mg | PREFILLED_SYRINGE | Freq: Once | SUBCUTANEOUS | Status: AC
  Administered 2020-09-22: 75 mg via SUBCUTANEOUS

## 2020-09-22 NOTE — Progress Notes (Signed)
Have you been hospitalized within the last 10 days?  No Do you have a fever?  No Do you have a cough?  No Do you have a headache or sore throat? No Do you have your Epi Pen visible and is it within date?  Yes 

## 2020-09-24 DIAGNOSIS — M810 Age-related osteoporosis without current pathological fracture: Secondary | ICD-10-CM | POA: Diagnosis not present

## 2020-09-29 DIAGNOSIS — C641 Malignant neoplasm of right kidney, except renal pelvis: Secondary | ICD-10-CM | POA: Diagnosis not present

## 2020-09-29 DIAGNOSIS — G8929 Other chronic pain: Secondary | ICD-10-CM | POA: Diagnosis not present

## 2020-09-29 DIAGNOSIS — M545 Low back pain, unspecified: Secondary | ICD-10-CM | POA: Diagnosis not present

## 2020-09-29 DIAGNOSIS — M0579 Rheumatoid arthritis with rheumatoid factor of multiple sites without organ or systems involvement: Secondary | ICD-10-CM | POA: Diagnosis not present

## 2020-09-29 DIAGNOSIS — M8000XA Age-related osteoporosis with current pathological fracture, unspecified site, initial encounter for fracture: Secondary | ICD-10-CM | POA: Diagnosis not present

## 2020-10-01 ENCOUNTER — Telehealth: Payer: Self-pay | Admitting: Cardiovascular Disease

## 2020-10-01 DIAGNOSIS — M5416 Radiculopathy, lumbar region: Secondary | ICD-10-CM | POA: Diagnosis not present

## 2020-10-01 DIAGNOSIS — M62830 Muscle spasm of back: Secondary | ICD-10-CM | POA: Diagnosis not present

## 2020-10-01 DIAGNOSIS — M5136 Other intervertebral disc degeneration, lumbar region: Secondary | ICD-10-CM | POA: Diagnosis not present

## 2020-10-01 MED ORDER — FUROSEMIDE 20 MG PO TABS
20.0000 mg | ORAL_TABLET | Freq: Every day | ORAL | 0 refills | Status: DC
Start: 2020-10-01 — End: 2020-12-28

## 2020-10-01 NOTE — Telephone Encounter (Signed)
Received fax from [facility] [address] in [place] requesting refill for Furosemide 20 mg daily. Rx request sent to pharmacy.

## 2020-10-05 ENCOUNTER — Telehealth: Payer: Self-pay | Admitting: Pulmonary Disease

## 2020-10-05 DIAGNOSIS — M5489 Other dorsalgia: Secondary | ICD-10-CM | POA: Diagnosis not present

## 2020-10-05 DIAGNOSIS — Z8673 Personal history of transient ischemic attack (TIA), and cerebral infarction without residual deficits: Secondary | ICD-10-CM | POA: Diagnosis not present

## 2020-10-05 DIAGNOSIS — H539 Unspecified visual disturbance: Secondary | ICD-10-CM | POA: Diagnosis not present

## 2020-10-05 NOTE — Telephone Encounter (Signed)
Patient rescheduled for 10/07/20 at 1615.

## 2020-10-06 ENCOUNTER — Ambulatory Visit: Payer: Medicare PPO

## 2020-10-06 DIAGNOSIS — H2513 Age-related nuclear cataract, bilateral: Secondary | ICD-10-CM | POA: Diagnosis not present

## 2020-10-07 ENCOUNTER — Ambulatory Visit (INDEPENDENT_AMBULATORY_CARE_PROVIDER_SITE_OTHER): Payer: Medicare PPO

## 2020-10-07 ENCOUNTER — Other Ambulatory Visit: Payer: Self-pay

## 2020-10-07 ENCOUNTER — Telehealth: Payer: Self-pay | Admitting: Cardiovascular Disease

## 2020-10-07 DIAGNOSIS — J4550 Severe persistent asthma, uncomplicated: Secondary | ICD-10-CM | POA: Diagnosis not present

## 2020-10-07 MED ORDER — OMALIZUMAB 75 MG/0.5ML ~~LOC~~ SOSY
75.0000 mg | PREFILLED_SYRINGE | Freq: Once | SUBCUTANEOUS | Status: AC
  Administered 2020-10-07: 75 mg via SUBCUTANEOUS

## 2020-10-07 MED ORDER — OMALIZUMAB 150 MG/ML ~~LOC~~ SOSY
300.0000 mg | PREFILLED_SYRINGE | Freq: Once | SUBCUTANEOUS | Status: AC
  Administered 2020-10-07: 300 mg via SUBCUTANEOUS

## 2020-10-07 NOTE — Progress Notes (Signed)
Have you been hospitalized within the last 10 days?  No Do you have a fever?  No Do you have a cough?  No Do you have a headache or sore throat? No Do you have your Epi Pen visible and is it within date?  Yes 

## 2020-10-07 NOTE — Telephone Encounter (Signed)
   [facility] Pre-operative Risk Assessment    HEARTCARE STAFF: - Please ensure there is not already an duplicate clearance open for this procedure. - Under Visit Info/Reason for Call, type in Other and utilize the format Clearance MM/DD/YY or Clearance TBD. Do not use dashes or single digits. - If request is for dental extraction, please clarify the # of teeth to be extracted.  Request for surgical clearance:  1. What type of surgery is being performed? surguical placement of dental impant  2. When is this surgery scheduled? 10/13/20  3. What type of clearance is required (medical clearance vs. Pharmacy clearance to hold med vs. Both)? both  4. Are there any medications that need to be held prior to surgery and how long? Eliquis 3 days prior  5. Practice name and name of physician performing surgery? [facility] Dr. Johnnette Litter  6. What is the office phone number? [phone]   7.   What is the office fax number? [phone]  8.   Anesthesia type (None, local, MAC, general) ? Not noted   Marykay Lex 10/07/2020, 2:31 PM  _________________________________________________________________   (provider comments below)

## 2020-10-07 NOTE — Telephone Encounter (Signed)
Patient with diagnosis of persistent atrial fibrillation on Eliquis for anticoagulation.    Procedure: Surgical placement of dental implant  Date of procedure: 10/13/20    :[id number]}  CHA2DS2-VASc Score = 7  This indicates a 11.2% annual risk of stroke. The patient's score is based upon: CHF History: 1 HTN History: 1 Diabetes History: 0 Stroke History: 2 Vascular Disease History: 1 Age Score: 2 Gender Score: 0   CrCl: 72 mL/min Platelet count 183  For dental procedures, holding anticoagulation for 1 day is typically recommended. DDS is requesting 3 day hold of anticoagulation.  Patient is at high thrombotic risk. Will send to cardiologist for input.

## 2020-10-08 DIAGNOSIS — E871 Hypo-osmolality and hyponatremia: Secondary | ICD-10-CM | POA: Diagnosis not present

## 2020-10-08 DIAGNOSIS — R600 Localized edema: Secondary | ICD-10-CM | POA: Diagnosis not present

## 2020-10-08 DIAGNOSIS — C641 Malignant neoplasm of right kidney, except renal pelvis: Secondary | ICD-10-CM | POA: Diagnosis not present

## 2020-10-08 DIAGNOSIS — N182 Chronic kidney disease, stage 2 (mild): Secondary | ICD-10-CM | POA: Diagnosis not present

## 2020-10-08 NOTE — Telephone Encounter (Signed)
Dr. Johnnette Litter office calling to check status of clearance. Please call and advise.

## 2020-10-08 NOTE — Telephone Encounter (Signed)
Hold Eliquis 1 day before procedure.

## 2020-10-08 NOTE — Telephone Encounter (Addendum)
   Primary Cardiologist: Kathlyn Sacramento, MD  Chart reviewed as part of pre-operative protocol coverage. Patient was contacted 10/08/2020 in reference to pre-operative risk assessment for pending surgery as outlined below.  [name] was last seen on 05/05/20 by Dr. Fletcher Anon.  Since that day, [name] has done fine from a cardiac standpoint. He is quite limited in activity by back pain and arthritis. He is unable to complete 4 METs, though has no anginal complaints with his limited activity.  Given low risk procedure, favor proceeding without further cardiovascular testing.   The patient was advised that if he develops new symptoms prior to surgery to contact our office to arrange for a follow-up visit, and he verbalized understanding.  Per pharmacy and Dr. Tyrell Antonio recommendation, patient can hold eliquis 1 day prior to his upcoming dental procedure with plans to restart as soon as he is cleared to do so by his dentist. Please note, this recommendation is different than the requested hold time.   I will route this recommendation to the requesting party via Epic fax function and remove from pre-op pool. Please call with questions.  Abigail Butts, PA-C 10/08/2020, 1:26 PM

## 2020-10-12 DIAGNOSIS — M62830 Muscle spasm of back: Secondary | ICD-10-CM | POA: Diagnosis not present

## 2020-10-12 DIAGNOSIS — I10 Essential (primary) hypertension: Secondary | ICD-10-CM | POA: Diagnosis not present

## 2020-10-12 DIAGNOSIS — M5116 Intervertebral disc disorders with radiculopathy, lumbar region: Secondary | ICD-10-CM | POA: Diagnosis not present

## 2020-10-12 DIAGNOSIS — Z466 Encounter for fitting and adjustment of urinary device: Secondary | ICD-10-CM | POA: Diagnosis not present

## 2020-10-12 DIAGNOSIS — C61 Malignant neoplasm of prostate: Secondary | ICD-10-CM | POA: Diagnosis not present

## 2020-10-12 DIAGNOSIS — J449 Chronic obstructive pulmonary disease, unspecified: Secondary | ICD-10-CM | POA: Diagnosis not present

## 2020-10-12 DIAGNOSIS — M810 Age-related osteoporosis without current pathological fracture: Secondary | ICD-10-CM | POA: Diagnosis not present

## 2020-10-12 DIAGNOSIS — I4891 Unspecified atrial fibrillation: Secondary | ICD-10-CM | POA: Diagnosis not present

## 2020-10-12 DIAGNOSIS — N319 Neuromuscular dysfunction of bladder, unspecified: Secondary | ICD-10-CM | POA: Diagnosis not present

## 2020-10-17 ENCOUNTER — Ambulatory Visit: Payer: Medicare PPO | Attending: Internal Medicine

## 2020-10-17 DIAGNOSIS — Z23 Encounter for immunization: Secondary | ICD-10-CM

## 2020-10-17 NOTE — Progress Notes (Signed)
   Covid-19 Vaccination Clinic  Name:  [name]    MRN: [id number] DOB: 02/16/1934  10/17/2020  Mr. [name] was observed post Covid-19 immunization for 15 minutes without incident. He was provided with Vaccine Information Sheet and instruction to access the V-Safe system.   Mr. [name] was instructed to call 911 with any severe reactions post vaccine: Marland Kitchen Difficulty breathing  . Swelling of face and throat  . A fast heartbeat  . A bad rash all over body  . Dizziness and weakness   Immunizations Administered    Name Date Dose VIS Date Route   Pfizer COVID-19 Vaccine 10/17/2020  1:55 PM 0.3 mL 09/16/2020 Intramuscular   Manufacturer: [place]   Lot: [id number]   [place]: [id number]

## 2020-10-19 DIAGNOSIS — Z466 Encounter for fitting and adjustment of urinary device: Secondary | ICD-10-CM | POA: Diagnosis not present

## 2020-10-19 DIAGNOSIS — I10 Essential (primary) hypertension: Secondary | ICD-10-CM | POA: Diagnosis not present

## 2020-10-19 DIAGNOSIS — N319 Neuromuscular dysfunction of bladder, unspecified: Secondary | ICD-10-CM | POA: Diagnosis not present

## 2020-10-19 DIAGNOSIS — J449 Chronic obstructive pulmonary disease, unspecified: Secondary | ICD-10-CM | POA: Diagnosis not present

## 2020-10-19 DIAGNOSIS — M810 Age-related osteoporosis without current pathological fracture: Secondary | ICD-10-CM | POA: Diagnosis not present

## 2020-10-19 DIAGNOSIS — I4891 Unspecified atrial fibrillation: Secondary | ICD-10-CM | POA: Diagnosis not present

## 2020-10-19 DIAGNOSIS — M5116 Intervertebral disc disorders with radiculopathy, lumbar region: Secondary | ICD-10-CM | POA: Diagnosis not present

## 2020-10-19 DIAGNOSIS — M62830 Muscle spasm of back: Secondary | ICD-10-CM | POA: Diagnosis not present

## 2020-10-19 DIAGNOSIS — C61 Malignant neoplasm of prostate: Secondary | ICD-10-CM | POA: Diagnosis not present

## 2020-10-21 DIAGNOSIS — M5116 Intervertebral disc disorders with radiculopathy, lumbar region: Secondary | ICD-10-CM | POA: Diagnosis not present

## 2020-10-21 DIAGNOSIS — I4891 Unspecified atrial fibrillation: Secondary | ICD-10-CM | POA: Diagnosis not present

## 2020-10-21 DIAGNOSIS — M810 Age-related osteoporosis without current pathological fracture: Secondary | ICD-10-CM | POA: Diagnosis not present

## 2020-10-21 DIAGNOSIS — N319 Neuromuscular dysfunction of bladder, unspecified: Secondary | ICD-10-CM | POA: Diagnosis not present

## 2020-10-21 DIAGNOSIS — J449 Chronic obstructive pulmonary disease, unspecified: Secondary | ICD-10-CM | POA: Diagnosis not present

## 2020-10-21 DIAGNOSIS — M62830 Muscle spasm of back: Secondary | ICD-10-CM | POA: Diagnosis not present

## 2020-10-21 DIAGNOSIS — Z466 Encounter for fitting and adjustment of urinary device: Secondary | ICD-10-CM | POA: Diagnosis not present

## 2020-10-21 DIAGNOSIS — I10 Essential (primary) hypertension: Secondary | ICD-10-CM | POA: Diagnosis not present

## 2020-10-21 DIAGNOSIS — C61 Malignant neoplasm of prostate: Secondary | ICD-10-CM | POA: Diagnosis not present

## 2020-10-26 ENCOUNTER — Ambulatory Visit (INDEPENDENT_AMBULATORY_CARE_PROVIDER_SITE_OTHER): Payer: Medicare PPO | Admitting: Physician Assistant

## 2020-10-26 ENCOUNTER — Other Ambulatory Visit: Payer: Self-pay

## 2020-10-26 ENCOUNTER — Telehealth: Payer: Self-pay | Admitting: Internal Medicine

## 2020-10-26 VITALS — BP 131/72 | HR 54 | Ht 72.0 in

## 2020-10-26 DIAGNOSIS — N319 Neuromuscular dysfunction of bladder, unspecified: Secondary | ICD-10-CM | POA: Diagnosis not present

## 2020-10-26 NOTE — Telephone Encounter (Signed)
Last filled 09-15-20 #90 Last OV 05-11-20 No Future OV [facility] [address] [employer]

## 2020-10-26 NOTE — Progress Notes (Signed)
Cath Change/ Replacement  Patient is present today for a catheter change due to urinary retention.  73ml of water was removed from the balloon, a 16FR foley cath was removed with out difficulty.  Patient was cleaned and prepped in a sterile fashion with betadine. A 16 FR foley cath was replaced into the bladder complications were noted as: bladder spasms during exchange Urine return was noted 87ml and urine was yellow in color. The balloon was filled with 65ml of sterile water. A leg bag was attached for drainage. . Patient was given proper instruction on catheter care.    Performed by: Fonnie Jarvis, CMA  Follow up: 1 month

## 2020-10-27 ENCOUNTER — Ambulatory Visit: Payer: Medicare PPO

## 2020-10-28 ENCOUNTER — Ambulatory Visit: Payer: Medicare PPO | Admitting: Pain Medicine

## 2020-10-28 ENCOUNTER — Other Ambulatory Visit: Payer: Self-pay

## 2020-10-28 ENCOUNTER — Ambulatory Visit (INDEPENDENT_AMBULATORY_CARE_PROVIDER_SITE_OTHER): Payer: Medicare PPO

## 2020-10-28 DIAGNOSIS — J4550 Severe persistent asthma, uncomplicated: Secondary | ICD-10-CM | POA: Diagnosis not present

## 2020-10-28 MED ORDER — OMALIZUMAB 150 MG/ML ~~LOC~~ SOSY
300.0000 mg | PREFILLED_SYRINGE | Freq: Once | SUBCUTANEOUS | Status: AC
  Administered 2020-10-28: 300 mg via SUBCUTANEOUS

## 2020-10-28 MED ORDER — OMALIZUMAB 75 MG/0.5ML ~~LOC~~ SOSY
75.0000 mg | PREFILLED_SYRINGE | Freq: Once | SUBCUTANEOUS | Status: AC
  Administered 2020-10-28: 75 mg via SUBCUTANEOUS

## 2020-10-28 NOTE — Progress Notes (Signed)
Have you been hospitalized within the last 10 days?  No Do you have a fever?  No Do you have a cough?  No Do you have a headache or sore throat? No Do you have your Epi Pen visible and is it within date?  Yes 

## 2020-10-29 DIAGNOSIS — J449 Chronic obstructive pulmonary disease, unspecified: Secondary | ICD-10-CM | POA: Diagnosis not present

## 2020-10-29 DIAGNOSIS — M810 Age-related osteoporosis without current pathological fracture: Secondary | ICD-10-CM | POA: Diagnosis not present

## 2020-10-29 DIAGNOSIS — I4891 Unspecified atrial fibrillation: Secondary | ICD-10-CM | POA: Diagnosis not present

## 2020-10-29 DIAGNOSIS — C61 Malignant neoplasm of prostate: Secondary | ICD-10-CM | POA: Diagnosis not present

## 2020-10-29 DIAGNOSIS — I10 Essential (primary) hypertension: Secondary | ICD-10-CM | POA: Diagnosis not present

## 2020-10-29 DIAGNOSIS — N319 Neuromuscular dysfunction of bladder, unspecified: Secondary | ICD-10-CM | POA: Diagnosis not present

## 2020-10-29 DIAGNOSIS — Z466 Encounter for fitting and adjustment of urinary device: Secondary | ICD-10-CM | POA: Diagnosis not present

## 2020-10-29 DIAGNOSIS — M62830 Muscle spasm of back: Secondary | ICD-10-CM | POA: Diagnosis not present

## 2020-10-29 DIAGNOSIS — M5116 Intervertebral disc disorders with radiculopathy, lumbar region: Secondary | ICD-10-CM | POA: Diagnosis not present

## 2020-10-30 DIAGNOSIS — M5116 Intervertebral disc disorders with radiculopathy, lumbar region: Secondary | ICD-10-CM | POA: Diagnosis not present

## 2020-10-30 DIAGNOSIS — I4891 Unspecified atrial fibrillation: Secondary | ICD-10-CM | POA: Diagnosis not present

## 2020-10-30 DIAGNOSIS — I10 Essential (primary) hypertension: Secondary | ICD-10-CM | POA: Diagnosis not present

## 2020-10-30 DIAGNOSIS — Z466 Encounter for fitting and adjustment of urinary device: Secondary | ICD-10-CM | POA: Diagnosis not present

## 2020-10-30 DIAGNOSIS — M810 Age-related osteoporosis without current pathological fracture: Secondary | ICD-10-CM | POA: Diagnosis not present

## 2020-10-30 DIAGNOSIS — M62830 Muscle spasm of back: Secondary | ICD-10-CM | POA: Diagnosis not present

## 2020-10-30 DIAGNOSIS — N319 Neuromuscular dysfunction of bladder, unspecified: Secondary | ICD-10-CM | POA: Diagnosis not present

## 2020-10-30 DIAGNOSIS — C61 Malignant neoplasm of prostate: Secondary | ICD-10-CM | POA: Diagnosis not present

## 2020-10-30 DIAGNOSIS — J449 Chronic obstructive pulmonary disease, unspecified: Secondary | ICD-10-CM | POA: Diagnosis not present

## 2020-11-02 DIAGNOSIS — N319 Neuromuscular dysfunction of bladder, unspecified: Secondary | ICD-10-CM | POA: Diagnosis not present

## 2020-11-02 DIAGNOSIS — M810 Age-related osteoporosis without current pathological fracture: Secondary | ICD-10-CM | POA: Diagnosis not present

## 2020-11-02 DIAGNOSIS — J449 Chronic obstructive pulmonary disease, unspecified: Secondary | ICD-10-CM | POA: Diagnosis not present

## 2020-11-02 DIAGNOSIS — I10 Essential (primary) hypertension: Secondary | ICD-10-CM | POA: Diagnosis not present

## 2020-11-02 DIAGNOSIS — M5116 Intervertebral disc disorders with radiculopathy, lumbar region: Secondary | ICD-10-CM | POA: Diagnosis not present

## 2020-11-02 DIAGNOSIS — M62830 Muscle spasm of back: Secondary | ICD-10-CM | POA: Diagnosis not present

## 2020-11-02 DIAGNOSIS — I4891 Unspecified atrial fibrillation: Secondary | ICD-10-CM | POA: Diagnosis not present

## 2020-11-02 DIAGNOSIS — Z466 Encounter for fitting and adjustment of urinary device: Secondary | ICD-10-CM | POA: Diagnosis not present

## 2020-11-02 DIAGNOSIS — C61 Malignant neoplasm of prostate: Secondary | ICD-10-CM | POA: Diagnosis not present

## 2020-11-02 NOTE — Telephone Encounter (Signed)
[name] called in wanted to know about getting a refill on prescription for Xanax. It showed it was discontinued.

## 2020-11-02 NOTE — Telephone Encounter (Signed)
Looks like the electronic refill failed. I do not recall getting a notice. I will call it in for him.  Refill left on VM at pharmacy. I tried to call the pt to let him know but there was no answer and no VM to leave a message.

## 2020-11-04 ENCOUNTER — Telehealth: Payer: Self-pay | Admitting: Cardiovascular Disease

## 2020-11-04 ENCOUNTER — Other Ambulatory Visit: Payer: Self-pay

## 2020-11-04 ENCOUNTER — Ambulatory Visit: Payer: Medicare PPO | Attending: Pain Medicine | Admitting: Pain Medicine

## 2020-11-04 ENCOUNTER — Encounter: Payer: Self-pay | Admitting: Pain Medicine

## 2020-11-04 VITALS — BP 121/60 | HR 55 | Temp 97.8°F | Resp 18 | Ht 69.0 in | Wt 200.0 lb

## 2020-11-04 DIAGNOSIS — R937 Abnormal findings on diagnostic imaging of other parts of musculoskeletal system: Secondary | ICD-10-CM | POA: Diagnosis not present

## 2020-11-04 DIAGNOSIS — M4807 Spinal stenosis, lumbosacral region: Secondary | ICD-10-CM | POA: Diagnosis not present

## 2020-11-04 DIAGNOSIS — M47816 Spondylosis without myelopathy or radiculopathy, lumbar region: Secondary | ICD-10-CM | POA: Insufficient documentation

## 2020-11-04 DIAGNOSIS — M25511 Pain in right shoulder: Secondary | ICD-10-CM | POA: Diagnosis not present

## 2020-11-04 DIAGNOSIS — G8929 Other chronic pain: Secondary | ICD-10-CM | POA: Insufficient documentation

## 2020-11-04 DIAGNOSIS — G894 Chronic pain syndrome: Secondary | ICD-10-CM | POA: Diagnosis not present

## 2020-11-04 DIAGNOSIS — M25531 Pain in right wrist: Secondary | ICD-10-CM | POA: Insufficient documentation

## 2020-11-04 DIAGNOSIS — G589 Mononeuropathy, unspecified: Secondary | ICD-10-CM | POA: Diagnosis not present

## 2020-11-04 DIAGNOSIS — M25512 Pain in left shoulder: Secondary | ICD-10-CM | POA: Insufficient documentation

## 2020-11-04 DIAGNOSIS — M25532 Pain in left wrist: Secondary | ICD-10-CM

## 2020-11-04 DIAGNOSIS — Z789 Other specified health status: Secondary | ICD-10-CM | POA: Diagnosis not present

## 2020-11-04 DIAGNOSIS — M25529 Pain in unspecified elbow: Secondary | ICD-10-CM | POA: Diagnosis present

## 2020-11-04 DIAGNOSIS — M899 Disorder of bone, unspecified: Secondary | ICD-10-CM | POA: Diagnosis not present

## 2020-11-04 DIAGNOSIS — M4187 Other forms of scoliosis, lumbosacral region: Secondary | ICD-10-CM

## 2020-11-04 DIAGNOSIS — M5137 Other intervertebral disc degeneration, lumbosacral region: Secondary | ICD-10-CM | POA: Diagnosis not present

## 2020-11-04 DIAGNOSIS — M545 Low back pain, unspecified: Secondary | ICD-10-CM | POA: Insufficient documentation

## 2020-11-04 DIAGNOSIS — R5381 Other malaise: Secondary | ICD-10-CM | POA: Insufficient documentation

## 2020-11-04 DIAGNOSIS — M4310 Spondylolisthesis, site unspecified: Secondary | ICD-10-CM | POA: Insufficient documentation

## 2020-11-04 DIAGNOSIS — Z79899 Other long term (current) drug therapy: Secondary | ICD-10-CM | POA: Insufficient documentation

## 2020-11-04 DIAGNOSIS — Z7901 Long term (current) use of anticoagulants: Secondary | ICD-10-CM | POA: Diagnosis not present

## 2020-11-04 NOTE — Telephone Encounter (Signed)
Patient has a follow-up visit with Dr. Fletcher Anon scheduled for tomorrow 11/05/2020; therefore, pre-op risk assessment and Eliquis recommendations can be addressed at that time. Will route note to Dr. Fletcher Anon so that he is aware and will add "pre-op eval" to appointment notes for visit tomorrow. Will remove from pre-op pool.  Darreld Mclean, PA-C 11/04/2020 4:25 PM

## 2020-11-04 NOTE — Telephone Encounter (Signed)
   [facility] Pre-operative Risk Assessment    HEARTCARE STAFF: - Please ensure there is not already an duplicate clearance open for this procedure. - Under Visit Info/Reason for Call, type in Other and utilize the format Clearance MM/DD/YY or Clearance TBD. Do not use dashes or single digits. - If request is for dental extraction, please clarify the # of teeth to be extracted.  Request for surgical clearance:  1. What type of surgery is being performed? Spinal injection for interventional pain mgt.  2. When is this surgery scheduled? TBD  3. What type of clearance is required (medical clearance vs. Pharmacy clearance to hold med vs. Both)? both  4. Are there any medications that need to be held prior to surgery and how long? Stop Eliquis 3 days before procedure  5. Practice name and name of physician performing surgery? [facility] - Dr Lonny Prude  6. What is the office phone number? [phone]   7.   What is the office fax number? [phone]  8.   Anesthesia type (None, local, MAC, general) ? Not listed    Ace Gins 11/04/2020, 4:12 PM  _________________________________________________________________   (provider comments below)

## 2020-11-04 NOTE — Progress Notes (Signed)
Patient: [name]  Service Category: E/M  Provider: Gaspar Cola, MD  DOB: [birth year]  DOS: 11/04/2020  Referring Provider: Harvest Dark, FNP  MRN: [id number]  Setting: Ambulatory outpatient  PCP: [name], MD  Type: New Patient  Specialty: Interventional Pain Management    Location: Office  Delivery: Face-to-face     Primary Reason(s) for Visit: Encounter for initial evaluation of one or more chronic problems (new to examiner) potentially causing chronic pain, and posing a threat to normal musculoskeletal function. (Level of risk: High) CC: Back Pain (low back)  HPI  [name] is a 86 y.o. year old, male patient, who comes for the first time to our practice referred by Meeler, Sherren Kerns, FNP for our initial evaluation of his chronic pain. He has GAD (generalized anxiety disorder); NEUROPATHY; Benign essential hypertension; Coronary atherosclerosis of native coronary artery; Allergic asthma; GERD; IBS; Neurogenic bladder; Chronic rheumatic arthritis ([place]); History of colonic polyps; Hyperlipidemia; CONSTIPATION, CHRONIC; OSTEOARTHRITIS; Chronic sinusitis; Venous stasis dermatitis; Routine general medical examination at a health care facility; Personal history of prostate cancer; Advanced directives, counseling/discussion; Presence of indwelling urinary catheter; Mood disorder ([place]); Asthma with acute exacerbation; Urinary tract infection associated with indwelling urethral catheter ([place]); Hyponatremia; AKI (acute kidney injury) ([place]); SIADH (syndrome of inappropriate ADH production) ([place]); TIA (transient ischemic attack); Carotid artery disease ([place]); Chronic constipation; Persistent atrial fibrillation ([place]); Paroxysmal atrial fibrillation ([place]); Renal cell carcinoma ([place]); Chest pain; Anemia in chronic kidney disease; Anxiety; Edema of lower extremity; Frailty; History of TIA (transient ischemic attack); Long term current use of immunosuppressive drug; Mild intermittent asthma  without complication; Obesity (BMI 30-39.9); Vision changes; Back pain without sciatica; Rheumatoid arthritis involving multiple sites with positive rheumatoid factor ([place]); Hyposmolality and/or hyponatremia; Mononeuritis; Chronic pain syndrome; Pharmacologic therapy; Disorder of skeletal system; Problems influencing health status; Abnormal MRI, lumbar spine (09/21/2020); Chronic low back pain (Bilateral) w/o sciatica; DDD (degenerative disc disease), lumbosacral; Lumbosacral foraminal stenosis; Lumbar facet hypertrophy; Levoscoliosis of lumbosacral spine; Grade 1 Retrolisthesis at L2-3 and L3-4; Chronic anticoagulation (Eliquis); Lumbar facet syndrome; Long term prescription benzodiazepine use; Chronic shoulder pain (Bilateral); Chronic elbow pain (Bilateral); and Chronic wrist pain (Bilateral) on their problem list. Today he comes in for evaluation of his Back Pain (low back)  Pain Assessment: Location:   Back Radiating: none Duration: Chronic pain Quality: Other (Comment) (The patient states that it feels as if he is lying on a rod and also the tailbone and buttocks, there is great pressure.) Severity: 3 /10 (subjective, self-reported pain score)  Effect on ADL: Needs help standing and sitting and dressing and bathing. Timing: Other (Comment) (whenever he tries to lay down.) Modifying factors: Getting up from a laying position. BP: 121/60  HR: (!) 55  Onset and Duration: Date of onset: 04/2020 Cause of pain: Unknown Severity: No change since onset, NAS-11 at its worse: 7/10 and NAS-11 at its best: 5/10 Timing: Not influenced by the time of the day and after lying on my back  Aggravating Factors: lying on my back Alleviating Factors: unknown Associated Problems: Constipation, Inability to control bladder (urine), Weakness, Pain that wakes patient up and Pain that does not allow patient to sleep Quality of Pain: Constant, Deep, Disabling and Throbbing Previous Examinations or Tests: MRI  scan Previous Treatments: Physical Therapy and Strengthening exercises  This is the case of an 86 year old male patient sent to our clinics as a "Fast-Track" for a lumbar epidural steroid injection.  Apparently the patient had previously been seen  at the [facility] by Dr. Sharlet Salina who apparently performed on 10/01/2020 bilateral S1 trigger point injections.  In addition the patient apparently also had some bilateral L5 trigger point injections done by Carolee Rota, FNP.  The patient was referred to [place] by her for bilateral L5-S1 transforaminal ESI x2.  The patient's case was reviewed and found to require further evaluation prior to scheduling any procedures, secondary to the patient's medical problems.  These include the fact that the patient is on Eliquis anticoagulation, has an indwelling Foley catheter, mood disorder consisting of generalized anxiety disorder and depression, paroxysmal atrial fibrillation, history of TIAs, carotid artery disease, coronary artery disease, irritable bowel syndrome, history of kidney failure, and a history of having a syndrome of inappropriate ADH production.  The patient comes into the clinic today accompanied by his daughter and they indicated that they were sent over here for the procedure because they could not get him into the procedure table at the [facility].  I asked the patient if he could lay flat on his stomach for 20 to 30 minutes for the procedure and he indicated that he would have no problems doing that.  He did indicate that he would probably need some help in getting onto the procedure table secondary to his generalized deconditioning.  Today he demonstrated significant weakness and deconditioning with a lot of difficulty standing from the sitting position.  They indicated that they usually use a walker.  Simple inspection of the patient reveals an elderly gentleman unable to stand up straight.  He has considerable thoracic kyphosis and  stands with both knees bent.  Physical exam was difficult to perform since he had a lot of difficulty attempting to do hyperextension maneuvers.  Palpation of his lumbar spine reveals a considerable scoliosis of the lumbosacral area.  He describes his pain to be around the area of the L4 vertebral body (line intersecting the 2 superior edges of the iliac crests bilaterally).  This pain seems to go through what at a glance appears to be the midline, but upon filling the spinous processes, it seems to be primarily the right side of the lower back.  He has increase tension with the paravertebral muscles.  He clearly shows guarding of the area as well as decreased range of motion, also probably due to guarding.  The primary area of pain is described to be that of the lower back, bilaterally.  This pain goes down to the tailbone area in the buttocks area.  The patient has no radicular symptoms.  Review of the patient's last lumbar MRI reveals:  09/21/2020 lumbar MRI FINDINGS: Alignment: levoscoliosis with apex at L4. Grade 1 retrolisthesis at L2-3 and L3-4 Paraspinal and other soft tissues: Absent right kidney  Disc levels: L1-2: Left asymmetric disc bulge. L2-3: Endplate spurring with disc space narrowing.  L3-4: Mild disc bulge. Mild right and no left neural foraminal stenosis. L4-5: Small disc bulge with moderate facet hypertrophy. Moderate right and no left neural foraminal stenosis. Right foraminal stenosis is progressed. L5-S1: Small disc bulge. No spinal canal stenosis. No right, but mild left neural foraminal stenosis.  Visualized sacrum: Normal.  IMPRESSION: 1. Multilevel lumbar degenerative disc disease with levoscoliosis and grade 1 retrolisthesis at L2-3 and L3-4. 2. Progression of moderate right L4-5 neural foraminal stenosis. 3. No spinal canal stenosis.  The patient has secondary area of pain is described to be that of the shoulders, elbows, and wrists, bilaterally.  Based on  today's evaluation, it would  seem to me that with the primary pain being that of the lower back with no lower extremity involvement, this means that the pain is very unlikely to be coming from the inner compartment of the lumbar spine.  One possible exception would be the area of the L2-3.  The L2 nerve root, if irritated or compressed because of the foraminal stenosis that usually accompanies any spondylolisthesis, could be responsible for pain in the lower back.  However, it would also tend to cause pain along the L2 dermatome affecting the groin, upper thigh and inner aspect of the lower extremities.  The patient does not have any symptoms in that area.  However the MRI does document facet hypertrophy and the patient has levoscoliosis with the apex at the L4 vertebral body.  Any scoliosis tends to cause facet arthropathy due to the uneven curvature of the spine in this patient's case, he has levoscoliosis where his spine is curving towards the left side.  Today I have personally reviewed the images on the patient's 09/20/2020 lumbar MRI and it is my impression that the patient has multilevel lumbar facet arthrosis, bilaterally.  I would agree that he does have significant facet hypertrophy at the L4-5 level with evidence of subarticular stenosis affecting primarily the left side however, he also has hypertrophy affecting the L5-S1 on the left side more than the right, at the L4-5 level it seems to be the right more than the left, but there is also clear evidence of intra-articular facet arthropathy affecting the L3-4, the L2-3, and the L1-2 facet joints as well.  There is also evidence of possible multilevel spinous process contact, possibly leading to a kissing spine syndrome.  The terminal sacral area cannot be fully evaluated since the MRI did not look that far down.  The MRI also provides very limited glimpse at the patient's SI joints.  Based on the above information, I believe the patient would benefit  more from a diagnostic bilateral lumbar facet block under fluoroscopic guidance.   Historic Controlled Substance Pharmacotherapy Review  PMP and historical list of controlled substances: Alprazolam 0.5 mg, 1 tab p.o. 3 times daily; tramadol 50 Current opioid analgesics:  None MME/day: 0 mg/day  Historical Monitoring: The patient  reports no history of drug use. List of all UDS Test(s): Lab Results  Component Value Date   MDMA NONE DETECTED 05/26/2018   COCAINSCRNUR NONE DETECTED 05/26/2018   PCPSCRNUR NONE DETECTED 05/26/2018   THCU NONE DETECTED 05/26/2018   ETH <10 05/25/2018   List of other Serum/Urine Drug Screening Test(s):  Lab Results  Component Value Date   COCAINSCRNUR NONE DETECTED 05/26/2018   THCU NONE DETECTED 05/26/2018   ETH <10 05/25/2018   Historical Background Evaluation: [place] PMP: PDMP reviewed during this encounter. Online review of the past 96-monthperiod conducted.             PMP NARX Score Report:  Narcotic: 160 Sedative: 371 Stimulant: 000 [place] Department of public safety, offender search: (Editor, commissioningInformation) Non-contributory Risk Assessment Profile: Aberrant behavior: None observed or detected today Risk factors for fatal opioid overdose: None identified today PMP NARX Overdose Risk Score: 110 Fatal overdose hazard ratio (HR): Calculation deferred Non-fatal overdose hazard ratio (HR): Calculation deferred Risk of opioid abuse or dependence: 0.7-3.0% with doses ? 36 MME/day and 6.1-26% with doses ? 120 MME/day.  Pharmacologic Plan: As per protocol, I have not taken over any controlled substance management, pending the results of ordered tests and/or consults.  Initial impression: Pending review of available data and ordered tests.  Meds   Current Outpatient Medications:  .  acetaminophen (TYLENOL) 500 MG tablet, Take 500 mg by mouth every 8 (eight) hours as needed for moderate pain or headache. , Disp: , Rfl:  .  alendronate (FOSAMAX) 70  MG tablet, Take 70 mg by mouth once a week. Will start after dental implant., Disp: , Rfl:  .  ALOE PO, Take 400 mg by mouth daily. , Disp: , Rfl:  .  ALPRAZolam (XANAX) 0.5 MG tablet, TAKE 1 TABLET BY MOUTH THREE TIMES DAILY AS NEEDED FOR ANXIETY OR SLEEP (Patient taking differently: Take 1 mg by mouth at bedtime. Takes primarily at night for sleep.  Or prn anxiety), Disp: 90 tablet, Rfl: 0 .  Ascorbic Acid (VITAMIN C PO), Take 1,000 mg by mouth daily. , Disp: , Rfl:  .  Calcium-Magnesium-Zinc 333-133-5 MG TABS, Take 1 tablet by mouth daily., Disp: , Rfl:  .  chlorhexidine (PERIDEX) 0.12 % solution, 1 mL by Mouth Rinse route as needed., Disp: , Rfl:  .  Cholecalciferol (VITAMIN D3) 5000 UNITS CAPS, Take 2,000 Units by mouth daily. , Disp: , Rfl:  .  Coenzyme Q10 (CO Q 10) 100 MG CAPS, Take 100 mg by mouth daily. , Disp: , Rfl:  .  CRANBERRY SOFT PO, Take 25,200 mg by mouth 2 (two) times daily. Takes 2 capsules once a day., Disp: , Rfl:  .  ELIQUIS 5 MG TABS tablet, TAKE 1 TABLET(5 MG) BY MOUTH TWICE DAILY, Disp: 60 tablet, Rfl: 6 .  EPINEPHrine 0.3 mg/0.3 mL IJ SOAJ injection, Inject 0.3 mg into the muscle as needed for anaphylaxis., Disp: , Rfl:  .  fluticasone (FLONASE) 50 MCG/ACT nasal spray, Place 2 sprays into both nostrils 2 (two) times daily., Disp: 48 g, Rfl: 3 .  folic acid (FOLVITE) 309 MCG tablet, Take 1,600 mcg by mouth daily. , Disp: , Rfl:  .  furosemide (LASIX) 20 MG tablet, Take 1 tablet (20 mg total) by mouth daily., Disp: 90 tablet, Rfl: 0 .  gabapentin (NEURONTIN) 300 MG capsule, Take 1 capsule (300 mg total) by mouth 3 (three) times daily. (Patient taking differently: Take 300 mg by mouth 4 (four) times daily. 600 mg a.m., 300 lunch, 600 p.m.), Disp: 90 capsule, Rfl: 11 .  hydroxychloroquine (PLAQUENIL) 200 MG tablet, Take 200 mg by mouth 2 (two) times daily. , Disp: , Rfl:  .  ketoconazole (NIZORAL) 2 % shampoo, , Disp: , Rfl:  .  methotrexate (RHEUMATREX) 2.5 MG tablet, Take  10 tablets by mouth once a week., Disp: , Rfl:  .  milk thistle 175 MG tablet, Take 175 mg by mouth daily., Disp: , Rfl:  .  Multiple Vitamin (MULTIVITAMIN) tablet, Take 1 tablet by mouth daily.  , Disp: , Rfl:  .  Multiple Vitamins-Minerals (BALANCED CARE SENIORS PO), Take 6 tablets by mouth daily. 3 tablets of fruit and 3 tablets of vegetables daily  Balance of Nature, Disp: , Rfl:  .  MYRBETRIQ 50 MG TB24 tablet, TAKE 1 TABLET(50 MG) BY MOUTH DAILY, Disp: 30 tablet, Rfl: 3 .  Omega-3 Fatty Acids (FISH OIL) 1200 MG CAPS, Take 1,200 mg by mouth daily., Disp: , Rfl:  .  omeprazole (PRILOSEC) 20 MG capsule, Take 20 mg by mouth daily as needed., Disp: , Rfl:  .  PHOSPHATIDYL CHOLINE PO, Take 1 tablet by mouth daily., Disp: , Rfl:  .  polyethylene glycol powder (GLYCOLAX/MIRALAX) 17 GM/SCOOP powder,  Take 17 g by mouth at bedtime. , Disp: , Rfl:  .  pravastatin (PRAVACHOL) 20 MG tablet, TAKE 1 TABLET(20 MG) BY MOUTH AT BEDTIME, Disp: 90 tablet, Rfl: 1 .  Probiotic Product (ALIGN) 4 MG CAPS, Take 1 capsule (4 mg total) by mouth daily., Disp: 15 capsule, Rfl: 0 .  Selenium 200 MCG CAPS, Take 200 mcg by mouth daily. , Disp: , Rfl:  .  sodium chloride 1 g tablet, Take 1 tablet (1 g total) by mouth 2 (two) times daily with a meal. (Patient taking differently: Take 1 g by mouth 3 (three) times daily with meals. ), Disp: 60 tablet, Rfl: 1 .  triamcinolone ointment (KENALOG) 0.1 %, Apply 1 application topically as needed., Disp: , Rfl:  .  Turmeric 500 MG CAPS, Take 500 mg by mouth daily. , Disp: , Rfl:  .  vitamin B-12 (CYANOCOBALAMIN) 1000 MCG tablet, Take 1,000 mcg by mouth daily., Disp: , Rfl:  .  Zinc 50 MG TABS, Take by mouth., Disp: , Rfl:  .  metoprolol tartrate (LOPRESSOR) 25 MG tablet, Take 0.5 tablets (12.5 mg total) by mouth 2 (two) times daily., Disp: 90 tablet, Rfl: 3  Current Facility-Administered Medications:  .  omalizumab (XOLAIR) prefilled syringe 300 mg, 300 mg, Subcutaneous, Once, Chesley Mires, MD .  omalizumab Arvid Right) prefilled syringe 75 mg, 75 mg, Subcutaneous, Once, Chesley Mires, MD  Imaging Review  Lumbosacral Imaging: Lumbar MR wo contrast: Results for orders placed during the hospital encounter of 09/20/20 MR LUMBAR SPINE WO CONTRAST  Narrative CLINICAL DATA:  Low back pain and difficulty walking  EXAM: MRI LUMBAR SPINE WITHOUT CONTRAST  TECHNIQUE: Multiplanar, multisequence MR imaging of the lumbar spine was performed. No intravenous contrast was administered.  COMPARISON:  Lumbar spine MRI 07/14/2010  FINDINGS: Segmentation:  Standard.  Alignment: levoscoliosis with apex at L4. Grade 1 retrolisthesis at L2-3 and L3-4  Vertebrae:  No fracture, evidence of discitis, or bone lesion.  Conus medullaris and cauda equina: Conus extends to the L1-2 level. Conus and cauda equina appear normal.  Paraspinal and other soft tissues: Absent right kidney  Disc levels:  L1-L2: Left asymmetric disc bulge. No spinal canal stenosis. No neural foraminal stenosis.  L2-L3: Endplate spurring with disc space narrowing. No spinal canal stenosis. No right, but mild left neural foraminal stenosis, unchanged.  L3-L4: Mild disc bulge. No spinal canal stenosis. Mild right and no left neural foraminal stenosis.  L4-L5: Small disc bulge with moderate facet hypertrophy. No spinal canal stenosis. Moderate right and no left neural foraminal stenosis. Right foraminal stenosis is progressed.  L5-S1: Small disc bulge. No spinal canal stenosis. No right, but mild left neural foraminal stenosis.  Visualized sacrum: Normal.  IMPRESSION: 1. Multilevel lumbar degenerative disc disease with levoscoliosis and grade 1 retrolisthesis at L2-3 and L3-4. 2. Progression of moderate right L4-5 neural foraminal stenosis. 3. No spinal canal stenosis.   Electronically Signed By: Ulyses Jarred M.D. On: 09/21/2020 00:18  Complexity Note: Imaging results reviewed. Results shared  with Mr. Orth, using Layman's terms.                        ROS  Cardiovascular: Heart trouble, Abnormal heart rhythm, High blood pressure and Blood thinners:  Anticoagulant Pulmonary or Respiratory: Wheezing and difficulty taking a deep full breath (Asthma) and Snoring  Neurological: Abnormal skin sensations (Peripheral Neuropathy) Psychological-Psychiatric: Anxiousness Gastrointestinal: Alternating episodes iof diarrhea and constipation (IBS-Irritable bowe syndrome) Genitourinary: Kidney disease Hematological:  Brusing easily, Bleeding easily and Positive for taking the following blood thinner: Eliquis Endocrine: No reported endocrine signs or symptoms such as high or low blood sugar, rapid heart rate due to high thyroid levels, obesity or weight gain due to slow thyroid or thyroid disease Rheumatologic: Rheumatoid arthritis and No reported rheumatological signs and symptoms such as fatigue, joint pain, tenderness, swelling, redness, heat, stiffness, decreased range of motion, with or without associated rash Musculoskeletal: Negative for myasthenia gravis, muscular dystrophy, multiple sclerosis or malignant hyperthermia Work History: Retired  Allergies  Mr. Carlyon is allergic to ciprofloxacin, citalopram hydrobromide, clindamycin, lorazepam, paroxetine, ramipril, simvastatin, and sulfa antibiotics.  Laboratory Chemistry Profile   Renal Lab Results  Component Value Date   BUN 21 05/11/2020   CREATININE 0.99 05/11/2020   GFR 72.02 05/11/2020   GFRAA >60 06/22/2019   GFRNONAA >60 06/22/2019   SPECGRAV >1.030 (H) 06/06/2019   PHUR 6.5 06/06/2019   PROTEINUR 1+ (A) 06/06/2019     Electrolytes Lab Results  Component Value Date   NA 131 (L) 05/11/2020   K 4.3 05/11/2020   CL 97 05/11/2020   CALCIUM 8.7 05/11/2020   PHOS 3.8 05/11/2020     Hepatic Lab Results  Component Value Date   AST 22 05/11/2020   ALT 14 05/11/2020   ALBUMIN 3.7 05/11/2020   ALBUMIN 3.7 05/11/2020    ALKPHOS 49 05/11/2020   LIPASE 50 10/28/2018     ID Lab Results  Component Value Date   SARSCOV2NAA NEGATIVE 06/13/2019   STAPHAUREUS NEGATIVE 08/26/2015   MRSAPCR NEGATIVE 08/26/2015     Bone No results found for: VD25OH, [id number], BO1751WC5, EN2778EU2, 25OHVITD1, 25OHVITD2, 25OHVITD3, TESTOFREE, TESTOSTERONE   Endocrine Lab Results  Component Value Date   GLUCOSE 84 05/11/2020   GLUCOSEU Negative 06/06/2019   HGBA1C 5.1 05/26/2018   TSH 3.159 04/19/2019   FREET4 1.06 05/11/2020     Neuropathy Lab Results  Component Value Date   VITAMINB12 821 05/03/2010   HGBA1C 5.1 05/26/2018     CNS No results found for: COLORCSF, APPEARCSF, RBCCOUNTCSF, WBCCSF, POLYSCSF, LYMPHSCSF, EOSCSF, PROTEINCSF, GLUCCSF, JCVIRUS, CSFOLI, IGGCSF, LABACHR, ACETBL, LABACHR, ACETBL   Inflammation (CRP: Acute  ESR: Chronic) Lab Results  Component Value Date   ESRSEDRATE 29 (H) 08/26/2015   LATICACIDVEN 1.5 10/18/2017     Rheumatology No results found for: RF, ANA, LABURIC, URICUR, LYMEIGGIGMAB, LYMEABIGMQN, HLAB27   Coagulation Lab Results  Component Value Date   INR 1.4 (H) 06/13/2019   LABPROT 17.0 (H) 06/13/2019   APTT 31 05/25/2018   PLT 183.0 05/11/2020     Cardiovascular Lab Results  Component Value Date   TROPONINI <0.03 05/30/2018   HGB 11.4 (L) 05/11/2020   HCT 33.5 (L) 05/11/2020     Screening Lab Results  Component Value Date   SARSCOV2NAA NEGATIVE 06/13/2019   COVIDSOURCE NASOPHARYNGEAL 04/19/2019   STAPHAUREUS NEGATIVE 08/26/2015   MRSAPCR NEGATIVE 08/26/2015     Cancer No results found for: CEA, CA125, LABCA2   Allergens No results found for: ALMOND, APPLE, ASPARAGUS, AVOCADO, BANANA, BARLEY, BASIL, BAYLEAF, GREENBEAN, LIMABEAN, WHITEBEAN, BEEFIGE, REDBEET, BLUEBERRY, BROCCOLI, CABBAGE, MELON, CARROT, CASEIN, CASHEWNUT, CAULIFLOWER, CELERY     Note: Lab results reviewed.  [place]  Drug: Mr. Stegall  reports no history of drug use. Alcohol:  reports current  alcohol use. Tobacco:  reports that he has never smoked. He has never used smokeless tobacco. Medical:  has a past medical history of (HFpEF) heart failure with preserved ejection fraction ([place]), Allergy, Anemia, Anxiety,  Arthritis, Asthma, Basal cell carcinoma, CAD (coronary artery disease), CHF (congestive heart failure) ([place]), Chronic kidney disease, Chronic sinusitis, Collagen vascular disease ([place]), Depression, Diverticulitis, Dysrhythmia, ED (erectile dysfunction), GERD (gastroesophageal reflux disease), History of SIADH, adenomatous colonic polyps, Hypertension, Hyponatremia, IBS (irritable bowel syndrome), Neurodermatitis, Neurogenic bladder, OSA (obstructive sleep apnea), Persistent atrial fibrillation ([place]), Prostate cancer ([place]), and Renal mass. Family: family history includes Heart disease (age of onset: 73) in his mother; Pneumonia (age of onset: 97) in his father; Skin cancer in his father, sister, and son.  Past Surgical History:  Procedure Laterality Date  . CARDIOVERSION N/A 04/23/2019   Procedure: CARDIOVERSION;  Surgeon: Nelva Bush, MD;  Location: [facility] ORS;  Service: Cardiovascular;  Laterality: N/A;  . KNEE ARTHROPLASTY Right 09/02/2015   Procedure: COMPUTER ASSISTED TOTAL KNEE ARTHROPLASTY;  Surgeon: [name], MD;  Location: [facility] ORS;  Service: Orthopedics;  Laterality: Right;  . LAPAROSCOPIC NEPHRECTOMY, HAND ASSISTED Right 06/17/2019   Procedure: HAND ASSISTED LAPAROSCOPIC NEPHRECTOMY;  Surgeon: Hollice Espy, MD;  Location: [facility] ORS;  Service: Urology;  Laterality: Right;  . NASAL SINUS SURGERY  2009   DEVIATED SEPTUM AND POLYPS  . permanent indwelling catheter    . PROSTATE SURGERY     PROSTATECTOMY  . TOTAL KNEE ARTHROPLASTY Left 9/15   Dr [name]  . URETHRAL STRICTURE DILATATION  02-2010   Dr.Cope   Active Ambulatory Problems    Diagnosis Date Noted  . GAD (generalized anxiety disorder) 01/27/2007  . NEUROPATHY 05/03/2010  . Benign essential hypertension  03/25/2009  . Coronary atherosclerosis of native coronary artery 01/27/2007  . Allergic asthma 01/27/2007  . GERD 01/27/2007  . IBS 05/05/2010  . Neurogenic bladder 05/24/2010  . Chronic rheumatic arthritis ([place]) 01/27/2007  . History of colonic polyps 05/05/2010  . Hyperlipidemia 12/02/2010  . CONSTIPATION, CHRONIC 12/07/2010  . OSTEOARTHRITIS 12/02/2010  . Chronic sinusitis 07/12/2020  . Venous stasis dermatitis 06/13/2011  . Routine general medical examination at a health care facility 04/09/2012  . Personal history of prostate cancer 03/04/2010  . Advanced directives, counseling/discussion 07/28/2014  . Presence of indwelling urinary catheter 08/20/2015  . Mood disorder ([place]) 01/18/2016  . Asthma with acute exacerbation 11/14/2016  . Urinary tract infection associated with indwelling urethral catheter ([place]) 12/19/2016  . Hyponatremia 01/16/2017  . AKI (acute kidney injury) ([place]) 04/03/2017  . SIADH (syndrome of inappropriate ADH production) ([place]) 04/17/2017  . TIA (transient ischemic attack) 06/06/2018  . Carotid artery disease ([place]) 06/06/2018  . Chronic constipation 12/07/2010  . Persistent atrial fibrillation ([place]) 07/12/2020  . Paroxysmal atrial fibrillation ([place]) 05/09/2019  . Renal cell carcinoma ([place]) 07/11/2019  . Chest pain 04/02/2020  . Anemia in chronic kidney disease 09/12/2019  . Anxiety 01/27/2007  . Edema of lower extremity 09/12/2019  . Frailty 04/24/2018  . History of TIA (transient ischemic attack) 12/06/2018  . Long term current use of immunosuppressive drug 04/24/2018  . Mild intermittent asthma without complication 45/62/5638  . Obesity (BMI 30-39.9) 04/24/2018  . Vision changes 12/06/2018  . Back pain without sciatica 07/18/2020  . Rheumatoid arthritis involving multiple sites with positive rheumatoid factor ([place]) 01/27/2007  . Hyposmolality and/or hyponatremia 09/12/2019  . Mononeuritis 05/03/2010  . Chronic pain syndrome 11/04/2020  .  Pharmacologic therapy 11/04/2020  . Disorder of skeletal system 11/04/2020  . Problems influencing health status 11/04/2020  . Abnormal MRI, lumbar spine (09/21/2020) 11/04/2020  . Chronic low back pain (Bilateral) w/o sciatica 11/04/2020  . DDD (degenerative disc disease), lumbosacral 11/04/2020  . Lumbosacral foraminal  stenosis 11/04/2020  . Lumbar facet hypertrophy 11/04/2020  . Levoscoliosis of lumbosacral spine 11/04/2020  . Grade 1 Retrolisthesis at L2-3 and L3-4 11/04/2020  . Chronic anticoagulation (Eliquis) 11/04/2020  . Lumbar facet syndrome 11/04/2020  . Long term prescription benzodiazepine use 11/04/2020  . Chronic shoulder pain (Bilateral) 11/04/2020  . Chronic elbow pain (Bilateral) 11/04/2020  . Chronic wrist pain (Bilateral) 11/04/2020   Resolved Ambulatory Problems    Diagnosis Date Noted  . ADENOCARCINOMA, PROSTATE, S/P PROSTATECTOMY 11/29/1991  . HYPONATREMIA 11/11/2009  . DEPRESSION 06/14/2010  . OBSTRUCTIVE SLEEP APNEA 05/13/2009  . HYPERTENSION, HEART CONTROLLED W/ CHF 05/04/2009  . ALLERGIC RHINITIS 12/09/2008  . DIVERTICULOSIS, COLON 01/27/2007  . ERECTILE DYSFUNCTION, ORGANIC 01/27/2007  . NEURODERMATITIS 01/27/2007  . LOC OSTEOARTHROS NOT SPEC PRIM/SEC LOWER LEG 02/15/2008  . RECTAL BLEEDING 12/02/2010  . SLEEP DISORDER, CHRONIC 12/21/2010  . Diverticulitis   . Pulmonary nodule 07/05/2011  . Hematoma of leg 05/20/2013  . Preoperative examination 07/28/2014  . Rectal discomfort 08/11/2014  . Acute bronchitis 09/15/2014  . Total knee replacement status 09/02/2015  . Anemia 09/07/2015  . Fall with injury 11/17/2015  . Contusion of rib on left side 11/17/2015  . Change in bowel habits 12/15/2015  . Flushing 05/18/2016  . Lobar pneumonia ([place]) 11/14/2016  . Pneumonia 12/11/2016  . Complicated UTI (urinary tract infection) 10/18/2017  . Fever 11/02/2017  . Expressive aphasia 05/25/2018  . Right renal mass 07/25/2018  . Dental abscess 11/02/2018   . Sore throat 06/07/2019   Past Medical History:  Diagnosis Date  . (HFpEF) heart failure with preserved ejection fraction ([place])   . Allergy   . Arthritis   . Asthma   . Basal cell carcinoma   . CAD (coronary artery disease)   . CHF (congestive heart failure) ([place])   . Chronic kidney disease   . Chronic sinusitis   . Collagen vascular disease ([place])   . Depression   . Dysrhythmia   . ED (erectile dysfunction)   . GERD (gastroesophageal reflux disease)   . History of SIADH   . Hx of adenomatous colonic polyps   . Hypertension   . IBS (irritable bowel syndrome)   . Neurodermatitis   . OSA (obstructive sleep apnea)   . Prostate cancer ([place])   . Renal mass    Constitutional Exam  General appearance: Well nourished, well developed, and well hydrated. In no apparent acute distress Vitals:   11/04/20 1332  BP: 121/60  Pulse: (!) 55  Resp: 18  Temp: 97.8 F (36.6 C)  TempSrc: Temporal  SpO2: 100%  Weight: 200 lb (90.7 kg)  Height: 5' 9" (1.753 m)   BMI Assessment: Estimated body mass index is 29.53 kg/m as calculated from the following:   Height as of this encounter: 5' 9" (1.753 m).   Weight as of this encounter: 200 lb (90.7 kg).  BMI interpretation table: BMI level Category Range association with higher incidence of chronic pain  <18 kg/m2 Underweight   18.5-24.9 kg/m2 Ideal body weight   25-29.9 kg/m2 Overweight Increased incidence by 20%  30-34.9 kg/m2 Obese (Class I) Increased incidence by 68%  35-39.9 kg/m2 Severe obesity (Class II) Increased incidence by 136%  >40 kg/m2 Extreme obesity (Class III) Increased incidence by 254%   Patient's current BMI Ideal Body weight  Body mass index is 29.53 kg/m. Ideal body weight: 70.7 kg (155 lb 13.8 oz) Adjusted ideal body weight: 78.7 kg (173 lb 8.3 oz)   BMI Readings from Last 4  Encounters:  11/04/20 29.53 kg/m  10/26/20 27.26 kg/m  08/24/20 29.68 kg/m  08/20/20 29.39 kg/m   Wt Readings from Last 4  Encounters:  11/04/20 200 lb (90.7 kg)  08/24/20 201 lb (91.2 kg)  08/20/20 199 lb (90.3 kg)  07/23/20 199 lb (90.3 kg)    Psych/Mental status: Alert, oriented x 3 (person, place, & time)       Eyes: PERLA Respiratory: No evidence of acute respiratory distress  Assessment  Primary Diagnosis & Pertinent Problem List: The primary encounter diagnosis was Chronic pain syndrome. Diagnoses of Chronic low back pain (Bilateral) w/o sciatica, Lumbar facet hypertrophy, Lumbar facet syndrome, Grade 1 Retrolisthesis at L2-3 and L3-4, Lumbosacral foraminal stenosis, DDD (degenerative disc disease), lumbosacral, Levoscoliosis of lumbosacral spine, Abnormal MRI, lumbar spine (09/21/2020), Chronic shoulder pain (Bilateral), Chronic elbow pain, unspecified laterality, Chronic wrist pain (Bilateral), Mononeuritis, Pharmacologic therapy, Chronic anticoagulation (Eliquis), Disorder of skeletal system, Problems influencing health status, and Long term prescription benzodiazepine use were also pertinent to this visit.  Visit Diagnosis (New problems to examiner): 1. Chronic pain syndrome   2. Chronic low back pain (Bilateral) w/o sciatica   3. Lumbar facet hypertrophy   4. Lumbar facet syndrome   5. Grade 1 Retrolisthesis at L2-3 and L3-4   6. Lumbosacral foraminal stenosis   7. DDD (degenerative disc disease), lumbosacral   8. Levoscoliosis of lumbosacral spine   9. Abnormal MRI, lumbar spine (09/21/2020)   10. Chronic shoulder pain (Bilateral)   11. Chronic elbow pain, unspecified laterality   12. Chronic wrist pain (Bilateral)   13. Mononeuritis   14. Pharmacologic therapy   15. Chronic anticoagulation (Eliquis)   16. Disorder of skeletal system   17. Problems influencing health status   18. Long term prescription benzodiazepine use    Plan of Care (Initial workup plan)  Note: Mr. Hausman was reminded that as per protocol, today's visit has been an evaluation only. We have not taken over the patient's  controlled substance management.  Problem-specific plan: No problem-specific Assessment & Plan notes found for this encounter.   Lab Orders     Compliance Drug Analysis, Ur     Comp. Metabolic Panel (12)     Magnesium     Vitamin B12     Sedimentation rate     25-Hydroxy vitamin D Lcms D2+D3     C-reactive protein     Vitamin B1 Imaging Orders  No imaging studies ordered today   Referral Orders  No referral(s) requested today    Procedure Orders     LUMBAR FACET(MEDIAL BRANCH NERVE BLOCK) MBNB Pharmacotherapy (current): Medications ordered:  No orders of the defined types were placed in this encounter.  Medications administered during this visit: [name] had no medications administered during this visit.   Pharmacological management options:  Opioid Analgesics: The patient was informed that there is no guarantee that he would be a candidate for opioid analgesics. The decision will be made following CDC guidelines. This decision will be based on the results of diagnostic studies, as well as Mr. Doyon risk profile.   Membrane stabilizer: To be determined at a later time  Muscle relaxant: To be determined at a later time  NSAID: To be determined at a later time  Other analgesic(s): To be determined at a later time   Interventional management options: Mr. Lysaght was informed that there is no guarantee that he would be a candidate for interventional therapies. The decision will be based on the results of  diagnostic studies, as well as Mr. Lotz risk profile.  Procedure(s) under consideration:  Diagnostic bilateral lumbar facet block #1  Diagnostic midline L2-3 LESI #1    Provider-requested follow-up: Return for Procedure (w/ sedation): (B) L-FCT #1, (Blood Thinner Protocol).  Future Appointments  Date Time Provider [place]  11/05/2020  3:40 PM Wellington Hampshire, MD [id number]  11/17/2020  3:00 PM [place] None   11/26/2020  3:00 PM Debroah Loop, PA-C [facility] None  01/20/2021  3:15 PM Hollice Espy, MD [facility] None    Note by: Gaspar Cola, MD Date: 11/04/2020; Time: 7:05 PM

## 2020-11-04 NOTE — Patient Instructions (Addendum)
____________________________________________________________________________________________  General Risks and Possible Complications  Patient Responsibilities: It is important that you read this as it is part of your informed consent. It is our duty to inform you of the risks and possible complications associated with treatments offered to you. It is your responsibility as a patient to read this and to ask questions about anything that is not clear or that you believe was not covered in this document.  Patient's Rights: You have the right to refuse treatment. You also have the right to change your mind, even after initially having agreed to have the treatment done. However, under this last option, if you wait until the last second to change your mind, you may be charged for the materials used up to that point.  Introduction: Medicine is not an exact science. Everything in Medicine, including the lack of treatment(s), carries the potential for danger, harm, or loss (which is by definition: Risk). In Medicine, a complication is a secondary problem, condition, or disease that can aggravate an already existing one. All treatments carry the risk of possible complications. The fact that a side effects or complications occurs, does not imply that the treatment was conducted incorrectly. It must be clearly understood that these can happen even when everything is done following the highest safety standards.  No treatment: You can choose not to proceed with the proposed treatment alternative. The "PRO(s)" would include: avoiding the risk of complications associated with the therapy. The "CON(s)" would include: not getting any of the treatment benefits. These benefits fall under one of three categories: diagnostic; therapeutic; and/or palliative. Diagnostic benefits include: getting information which can ultimately lead to improvement of the disease or symptom(s). Therapeutic benefits are those associated with the  successful treatment of the disease. Finally, palliative benefits are those related to the decrease of the primary symptoms, without necessarily curing the condition (example: decreasing the pain from a flare-up of a chronic condition, such as incurable terminal cancer).  General Risks and Complications: These are associated to most interventional treatments. They can occur alone, or in combination. They fall under one of the following six (6) categories: no benefit or worsening of symptoms; bleeding; infection; nerve damage; allergic reactions; and/or death. 1. No benefits or worsening of symptoms: In Medicine there are no guarantees, only probabilities. No healthcare provider can ever guarantee that a medical treatment will work, they can only state the probability that it may. Furthermore, there is always the possibility that the condition may worsen, either directly, or indirectly, as a consequence of the treatment. 2. Bleeding: This is more common if the patient is taking a blood thinner, either prescription or over the counter (example: Goody Powders, Fish oil, Aspirin, Garlic, etc.), or if suffering a condition associated with impaired coagulation (example: Hemophilia, cirrhosis of the liver, low platelet counts, etc.). However, even if you do not have one on these, it can still happen. If you have any of these conditions, or take one of these drugs, make sure to notify your treating physician. 3. Infection: This is more common in patients with a compromised immune system, either due to disease (example: diabetes, cancer, human immunodeficiency virus [HIV], etc.), or due to medications or treatments (example: therapies used to treat cancer and rheumatological diseases). However, even if you do not have one on these, it can still happen. If you have any of these conditions, or take one of these drugs, make sure to notify your treating physician. 4. Nerve Damage: This is more common when the   treatment is  an invasive one, but it can also happen with the use of medications, such as those used in the treatment of cancer. The damage can occur to small secondary nerves, or to large primary ones, such as those in the spinal cord and brain. This damage may be temporary or permanent and it may lead to impairments that can range from temporary numbness to permanent paralysis and/or brain death. 5. Allergic Reactions: Any time a substance or material comes in contact with our body, there is the possibility of an allergic reaction. These can range from a mild skin rash (contact dermatitis) to a severe systemic reaction (anaphylactic reaction), which can result in death. 6. Death: In general, any medical intervention can result in death, most of the time due to an unforeseen complication. ____________________________________________________________________________________________  ____________________________________________________________________________________________  Preparing for Procedure with Sedation  Procedure appointments are limited to planned procedures: . No Prescription Refills. . No disability issues will be discussed. . No medication changes will be discussed.  Instructions: . Oral Intake: Do not eat or drink anything for at least 8 hours prior to your procedure. (Exception: Blood Pressure Medication. See below.) . Transportation: Unless otherwise stated by your physician, you may drive yourself after the procedure. . Blood Pressure Medicine: Do not forget to take your blood pressure medicine with a sip of water the morning of the procedure. If your Diastolic (lower reading)is above 100 mmHg, elective cases will be cancelled/rescheduled. . Blood thinners: These will need to be stopped for procedures. Notify our staff if you are taking any blood thinners. Depending on which one you take, there will be specific instructions on how and when to stop it. . Diabetics on insulin: Notify the staff  so that you can be scheduled 1st case in the morning. If your diabetes requires high dose insulin, take only  of your normal insulin dose the morning of the procedure and notify the staff that you have done so. . Preventing infections: Shower with an antibacterial soap the morning of your procedure. . Build-up your immune system: Take 1000 mg of Vitamin C with every meal (3 times a day) the day prior to your procedure. . Antibiotics: Inform the staff if you have a condition or reason that requires you to take antibiotics before dental procedures. . Pregnancy: If you are pregnant, call and cancel the procedure. . Sickness: If you have a cold, fever, or any active infections, call and cancel the procedure. . Arrival: You must be in the facility at least 30 minutes prior to your scheduled procedure. . Children: Do not bring children with you. . Dress appropriately: Bring dark clothing that you would not mind if they get stained. . Valuables: Do not bring any jewelry or valuables.  Reasons to call and reschedule or cancel your procedure: (Following these recommendations will minimize the risk of a serious complication.) . Surgeries: Avoid having procedures within 2 weeks of any surgery. (Avoid for 2 weeks before or after any surgery). . Flu Shots: Avoid having procedures within 2 weeks of a flu shots or . (Avoid for 2 weeks before or after immunizations). . Barium: Avoid having a procedure within 7-10 days after having had a radiological study involving the use of radiological contrast. (Myelograms, Barium swallow or enema study). . Heart attacks: Avoid any elective procedures or surgeries for the initial 6 months after a "Myocardial Infarction" (Heart Attack). . Blood thinners: It is imperative that you stop these medications before procedures. Let us know if you   if you take any blood thinner.  . Infection: Avoid procedures during or within two weeks of an infection (including chest colds or  gastrointestinal problems). Symptoms associated with infections include: Localized redness, fever, chills, night sweats or profuse sweating, burning sensation when voiding, cough, congestion, stuffiness, runny nose, sore throat, diarrhea, nausea, vomiting, cold or Flu symptoms, recent or current infections. It is specially important if the infection is over the area that we intend to treat. Marland Kitchen Heart and lung problems: Symptoms that may suggest an active cardiopulmonary problem include: cough, chest pain, breathing difficulties or shortness of breath, dizziness, ankle swelling, uncontrolled high or unusually low blood pressure, and/or palpitations. If you are experiencing any of these symptoms, cancel your procedure and contact your primary care physician for an evaluation.  Remember:  Regular Business hours are:  Monday to Thursday 8:00 AM to 4:00 PM  Provider's Schedule: Milinda Pointer, MD:  Procedure days: Tuesday and Thursday 7:30 AM to 4:00 PM  Gillis Santa, MD:  Procedure days: Monday and Wednesday 7:30 AM to 4:00 PM ____________________________________________________________________________________________  ____________________________________________________________________________________________  Blood Thinners  IMPORTANT NOTICE:  If you take any of these, make sure to notify the nursing staff.  Failure to do so may result in injury.  Recommended time intervals to stop and restart blood-thinners, before & after invasive procedures  Generic Name Brand Name Stop Time. Must be stopped at least this long before procedures. After procedures, wait at least this long before re-starting.  Abciximab Reopro 15 days 2 hrs  Alteplase Activase 10 days 10 days  Anagrelide Agrylin    Apixaban Eliquis 3 days 6 hrs  Cilostazol Pletal 3 days 5 hrs  Clopidogrel Plavix 7-10 days 2 hrs  Dabigatran Pradaxa 5 days 6 hrs  Dalteparin Fragmin 24 hours 4 hrs  Dipyridamole Aggrenox 11days 2 hrs   Edoxaban Lixiana; Savaysa 3 days 2 hrs  Enoxaparin  Lovenox 24 hours 4 hrs  Eptifibatide Integrillin 8 hours 2 hrs  Fondaparinux  Arixtra 72 hours 12 hrs  Prasugrel Effient 7-10 days 6 hrs  Reteplase Retavase 10 days 10 days  Rivaroxaban Xarelto 3 days 6 hrs  Ticagrelor Brilinta 5-7 days 6 hrs  Ticlopidine Ticlid 10-14 days 2 hrs  Tinzaparin Innohep 24 hours 4 hrs  Tirofiban Aggrastat 8 hours 2 hrs  Warfarin Coumadin 5 days 2 hrs   Other medications with blood-thinning effects  Product indications Generic (Brand) names Note  Cholesterol Lipitor Stop 4 days before procedure  Blood thinner (injectable) Heparin (LMW or LMWH Heparin) Stop 24 hours before procedure  Cancer Ibrutinib (Imbruvica) Stop 7 days before procedure  Malaria/Rheumatoid Hydroxychloroquine (Plaquenil) Stop 11 days before procedure  Thrombolytics  10 days before or after procedures   Over-the-counter (OTC) Products with blood-thinning effects  Product Common names Stop Time  Aspirin > 325 mg Goody Powders, Excedrin, etc. 11 days  Aspirin ? 81 mg  7 days  Fish oil  4 days  Garlic supplements  7 days  Ginkgo biloba  36 hours  Ginseng  24 hours  NSAIDs Ibuprofen, Naprosyn, etc. 3 days  Vitamin E  4 days   ____________________________________________________________________________________________     Pain Management Discharge Instructions  General Discharge Instructions :  If you need to reach your doctor call: Monday-Friday 8:00 am - 4:00 pm at [phone] or toll free [phone].  After clinic hours [phone] to have operator reach doctor.  Bring all of your medication bottles to all your appointments in the pain clinic.  To cancel or reschedule your  appointment with Pain Management please remember to call 24 hours in advance to avoid a fee.  Refer to the educational materials which you have been given on: General Risks, I had my Procedure. Discharge Instructions, Post Sedation.  Post  Procedure Instructions:  The drugs you were given will stay in your system until tomorrow, so for the next 24 hours you should not drive, make any legal decisions or drink any alcoholic beverages.  You may eat anything you prefer, but it is better to start with liquids then soups and crackers, and gradually work up to solid foods.  Please notify your doctor immediately if you have any unusual bleeding, trouble breathing or pain that is not related to your normal pain.  Depending on the type of procedure that was done, some parts of your body may feel week and/or numb.  This usually clears up by tonight or the next day.  Walk with the use of an assistive device or accompanied by an adult for the 24 hours.  You may use ice on the affected area for the first 24 hours.  Put ice in a Ziploc bag and cover with a towel and place against area 15 minutes on 15 minutes off.  You may switch to heat after 24 hours.GENERAL RISKS AND COMPLICATIONS  What are the risk, side effects and possible complications? Generally speaking, most procedures are safe.  However, with any procedure there are risks, side effects, and the possibility of complications.  The risks and complications are dependent upon the sites that are lesioned, or the type of nerve block to be performed.  The closer the procedure is to the spine, the more serious the risks are.  Great care is taken when placing the radio frequency needles, block needles or lesioning probes, but sometimes complications can occur. 1. Infection: Any time there is an injection through the skin, there is a risk of infection.  This is why sterile conditions are used for these blocks.  There are four possible types of infection. 1. Localized skin infection. 2. Central Nervous System Infection-This can be in the form of Meningitis, which can be deadly. 3. Epidural Infections-This can be in the form of an epidural abscess, which can cause pressure inside of the spine,  causing compression of the spinal cord with subsequent paralysis. This would require an emergency surgery to decompress, and there are no guarantees that the patient would recover from the paralysis. 4. Discitis-This is an infection of the intervertebral discs.  It occurs in about 1% of discography procedures.  It is difficult to treat and it may lead to surgery.        2. Pain: the needles have to go through skin and soft tissues, will cause soreness.       3. Damage to internal structures:  The nerves to be lesioned may be near blood vessels or    other nerves which can be potentially damaged.       4. Bleeding: Bleeding is more common if the patient is taking blood thinners such as  aspirin, Coumadin, Ticiid, Plavix, etc., or if he/she have some genetic predisposition  such as hemophilia. Bleeding into the spinal canal can cause compression of the spinal  cord with subsequent paralysis.  This would require an emergency surgery to  decompress and there are no guarantees that the patient would recover from the  paralysis.       5. Pneumothorax:  Puncturing of a lung is a possibility, every time a  needle is introduced in  the area of the chest or upper back.  Pneumothorax refers to free air around the  collapsed lung(s), inside of the thoracic cavity (chest cavity).  Another two possible  complications related to a similar event would include: Hemothorax and Chylothorax.   These are variations of the Pneumothorax, where instead of air around the collapsed  lung(s), you may have blood or chyle, respectively.       6. Spinal headaches: They may occur with any procedures in the area of the spine.       7. Persistent CSF (Cerebro-Spinal Fluid) leakage: This is a rare problem, but may occur  with prolonged intrathecal or epidural catheters either due to the formation of a fistulous  track or a dural tear.       8. Nerve damage: By working so close to the spinal cord, there is always a possibility of  nerve  damage, which could be as serious as a permanent spinal cord injury with  paralysis.       9. Death:  Although rare, severe deadly allergic reactions known as "Anaphylactic  reaction" can occur to any of the medications used.      10. Worsening of the symptoms:  We can always make thing worse.  What are the chances of something like this happening? Chances of any of this occuring are extremely low.  By statistics, you have more of a chance of getting killed in a motor vehicle accident: while driving to the hospital than any of the above occurring .  Nevertheless, you should be aware that they are possibilities.  In general, it is similar to taking a shower.  Everybody knows that you can slip, hit your head and get killed.  Does that mean that you should not shower again?  Nevertheless always keep in mind that statistics do not mean anything if you happen to be on the wrong side of them.  Even if a procedure has a 1 (one) in a 1,000,000 (million) chance of going wrong, it you happen to be that one..Also, keep in mind that by statistics, you have more of a chance of having something go wrong when taking medications.  Who should not have this procedure? If you are on a blood thinning medication (e.g. Coumadin, Plavix, see list of "Blood Thinners"), or if you have an active infection going on, you should not have the procedure.  If you are taking any blood thinners, please inform your physician.  How should I prepare for this procedure?  Do not eat or drink anything at least six hours prior to the procedure.  Bring a driver with you .  It cannot be a taxi.  Come accompanied by an adult that can drive you back, and that is strong enough to help you if your legs get weak or numb from the local anesthetic.  Take all of your medicines the morning of the procedure with just enough water to swallow them.  If you have diabetes, make sure that you are scheduled to have your procedure done first thing in the  morning, whenever possible.  If you have diabetes, take only half of your insulin dose and notify our nurse that you have done so as soon as you arrive at the clinic.  If you are diabetic, but only take blood sugar pills (oral hypoglycemic), then do not take them on the morning of your procedure.  You may take them after you have had the procedure.  Do not take aspirin or any aspirin-containing medications, at least eleven (11) days prior to the procedure.  They may prolong bleeding.  Wear loose fitting clothing that may be easy to take off and that you would not mind if it got stained with Betadine or blood.  Do not wear any jewelry or perfume  Remove any nail coloring.  It will interfere with some of our monitoring equipment.  NOTE: Remember that this is not meant to be interpreted as a complete list of all possible complications.  Unforeseen problems may occur.  BLOOD THINNERS The following drugs contain aspirin or other products, which can cause increased bleeding during surgery and should not be taken for 2 weeks prior to and 1 week after surgery.  If you should need take something for relief of minor pain, you may take acetaminophen which is found in Tylenol,m Datril, Anacin-3 and Panadol. It is not blood thinner. The products listed below are.  Do not take any of the products listed below in addition to any listed on your instruction sheet.  A.P.C or A.P.C with Codeine Codeine Phosphate Capsules #3 Ibuprofen Ridaura  ABC compound Congesprin Imuran rimadil  Advil Cope Indocin Robaxisal  Alka-Seltzer Effervescent Pain Reliever and Antacid Coricidin or Coricidin-D  Indomethacin Rufen  Alka-Seltzer plus Cold Medicine Cosprin Ketoprofen S-A-C Tablets  Anacin Analgesic Tablets or Capsules Coumadin Korlgesic Salflex  Anacin Extra Strength Analgesic tablets or capsules CP-2 Tablets Lanoril Salicylate  Anaprox Cuprimine Capsules Levenox Salocol  Anexsia-D Dalteparin Magan Salsalate   Anodynos Darvon compound Magnesium Salicylate Sine-off  Ansaid Dasin Capsules Magsal Sodium Salicylate  Anturane Depen Capsules Marnal Soma  APF Arthritis pain formula Dewitt's Pills Measurin Stanback  Argesic Dia-Gesic Meclofenamic Sulfinpyrazone  Arthritis Bayer Timed Release Aspirin Diclofenac Meclomen Sulindac  Arthritis pain formula Anacin Dicumarol Medipren Supac  Analgesic (Safety coated) Arthralgen Diffunasal Mefanamic Suprofen  Arthritis Strength Bufferin Dihydrocodeine Mepro Compound Suprol  Arthropan liquid Dopirydamole Methcarbomol with Aspirin Synalgos  ASA tablets/Enseals Disalcid Micrainin Tagament  Ascriptin Doan's Midol Talwin  Ascriptin A/D Dolene Mobidin Tanderil  Ascriptin Extra Strength Dolobid Moblgesic Ticlid  Ascriptin with Codeine Doloprin or Doloprin with Codeine Momentum Tolectin  Asperbuf Duoprin Mono-gesic Trendar  Aspergum Duradyne Motrin or Motrin IB Triminicin  Aspirin plain, buffered or enteric coated Durasal Myochrisine Trigesic  Aspirin Suppositories Easprin Nalfon Trillsate  Aspirin with Codeine Ecotrin Regular or Extra Strength Naprosyn Uracel  Atromid-S Efficin Naproxen Ursinus  Auranofin Capsules Elmiron Neocylate Vanquish  Axotal Emagrin Norgesic Verin  Azathioprine Empirin or Empirin with Codeine Normiflo Vitamin E  Azolid Emprazil Nuprin Voltaren  Bayer Aspirin plain, buffered or children's or timed BC Tablets or powders Encaprin Orgaran Warfarin Sodium  Buff-a-Comp Enoxaparin Orudis Zorpin  Buff-a-Comp with Codeine Equegesic Os-Cal-Gesic   Buffaprin Excedrin plain, buffered or Extra Strength Oxalid   Bufferin Arthritis Strength Feldene Oxphenbutazone   Bufferin plain or Extra Strength Feldene Capsules Oxycodone with Aspirin   Bufferin with Codeine Fenoprofen Fenoprofen Pabalate or Pabalate-SF   Buffets II Flogesic Panagesic   Buffinol plain or Extra Strength Florinal or Florinal with Codeine Panwarfarin   Buf-Tabs Flurbiprofen  Penicillamine   Butalbital Compound Four-way cold tablets Penicillin   Butazolidin Fragmin Pepto-Bismol   Carbenicillin Geminisyn Percodan   Carna Arthritis Reliever Geopen Persantine   Carprofen Gold's salt Persistin   Chloramphenicol Goody's Phenylbutazone   Chloromycetin Haltrain Piroxlcam   Clmetidine heparin Plaquenil   Cllnoril Hyco-pap Ponstel   Clofibrate Hydroxy chloroquine Propoxyphen         Before stopping  any of these medications, be sure to consult the physician who ordered them.  Some, such as Coumadin (Warfarin) are ordered to prevent or treat serious conditions such as "deep thrombosis", "pumonary embolisms", and other heart problems.  The amount of time that you may need off of the medication may also vary with the medication and the reason for which you were taking it.  If you are taking any of these medications, please make sure you notify your pain physician before you undergo any procedures.         Epidural Steroid Injection Patient Information  Description: The epidural space surrounds the nerves as they exit the spinal cord.  In some patients, the nerves can be compressed and inflamed by a bulging disc or a tight spinal canal (spinal stenosis).  By injecting steroids into the epidural space, we can bring irritated nerves into direct contact with a potentially helpful medication.  These steroids act directly on the irritated nerves and can reduce swelling and inflammation which often leads to decreased pain.  Epidural steroids may be injected anywhere along the spine and from the neck to the low back depending upon the location of your pain.   After numbing the skin with local anesthetic (like Novocaine), a small needle is passed into the epidural space slowly.  You may experience a sensation of pressure while this is being done.  The entire block usually last less than 10 minutes.  Conditions which may be treated by epidural steroids:   Low back and leg  pain  Neck and arm pain  Spinal stenosis  Post-laminectomy syndrome  Herpes zoster (shingles) pain  Pain from compression fractures  Preparation for the injection:  1. Do not eat any solid food or dairy products within 8 hours of your appointment.  2. You may drink clear liquids up to 3 hours before appointment.  Clear liquids include water, black coffee, juice or soda.  No milk or cream please. 3. You may take your regular medication, including pain medications, with a sip of water before your appointment  Diabetics should hold regular insulin (if taken separately) and take 1/2 normal NPH dos the morning of the procedure.  Carry some sugar containing items with you to your appointment. 4. A driver must accompany you and be prepared to drive you home after your procedure.  5. Bring all your current medications with your. 6. An IV may be inserted and sedation may be given at the discretion of the physician.   7. A blood pressure cuff, EKG and other monitors will often be applied during the procedure.  Some patients may need to have extra oxygen administered for a short period. 8. You will be asked to provide medical information, including your allergies, prior to the procedure.  We must know immediately if you are taking blood thinners (like Coumadin/Warfarin)  Or if you are allergic to IV iodine contrast (dye). We must know if you could possible be pregnant.  Possible side-effects:  Bleeding from needle site  Infection (rare, may require surgery)  Nerve injury (rare)  Numbness & tingling (temporary)  Difficulty urinating (rare, temporary)  Spinal headache ( a headache worse with upright posture)  Light -headedness (temporary)  Pain at injection site (several days)  Decreased blood pressure (temporary)  Weakness in arm/leg (temporary)  Pressure sensation in back/neck (temporary)  Call if you experience:  Fever/chills associated with headache or increased back/neck  pain.  Headache worsened by an upright position.  New onset weakness or  numbness of an extremity below the injection site  Hives or difficulty breathing (go to the emergency room)  Inflammation or drainage at the infection site  Severe back/neck pain  Any new symptoms which are concerning to you  Please note:  Although the local anesthetic injected can often make your back or neck feel good for several hours after the injection, the pain will likely return.  It takes 3-7 days for steroids to work in the epidural space.  You may not notice any pain relief for at least that one week.  If effective, we will often do a series of three injections spaced 3-6 weeks apart to maximally decrease your pain.  After the initial series, we generally will wait several months before considering a repeat injection of the same type.  If you have any questions, please call [phone] [facility]

## 2020-11-05 ENCOUNTER — Encounter: Payer: Self-pay | Admitting: Cardiovascular Disease

## 2020-11-05 ENCOUNTER — Ambulatory Visit: Payer: Medicare PPO | Admitting: Cardiovascular Disease

## 2020-11-05 ENCOUNTER — Telehealth: Payer: Self-pay

## 2020-11-05 ENCOUNTER — Other Ambulatory Visit
Admission: RE | Admit: 2020-11-05 | Discharge: 2020-11-05 | Disposition: A | Discharge status: Home / Self Care | Payer: Medicare PPO | Source: Ambulatory Visit | Attending: Pain Medicine | Admitting: Pain Medicine

## 2020-11-05 VITALS — BP 118/60 | HR 53 | Ht 69.0 in | Wt 200.0 lb

## 2020-11-05 DIAGNOSIS — I350 Nonrheumatic aortic (valve) stenosis: Secondary | ICD-10-CM | POA: Diagnosis not present

## 2020-11-05 DIAGNOSIS — G894 Chronic pain syndrome: Secondary | ICD-10-CM | POA: Insufficient documentation

## 2020-11-05 DIAGNOSIS — I5032 Chronic diastolic (congestive) heart failure: Secondary | ICD-10-CM | POA: Diagnosis not present

## 2020-11-05 DIAGNOSIS — I4819 Other persistent atrial fibrillation: Secondary | ICD-10-CM

## 2020-11-05 DIAGNOSIS — Z79899 Other long term (current) drug therapy: Secondary | ICD-10-CM | POA: Insufficient documentation

## 2020-11-05 DIAGNOSIS — Z789 Other specified health status: Secondary | ICD-10-CM | POA: Insufficient documentation

## 2020-11-05 DIAGNOSIS — M899 Disorder of bone, unspecified: Secondary | ICD-10-CM | POA: Insufficient documentation

## 2020-11-05 DIAGNOSIS — G589 Mononeuropathy, unspecified: Secondary | ICD-10-CM | POA: Insufficient documentation

## 2020-11-05 LAB — COMPREHENSIVE METABOLIC PANEL
ALT: 17 U/L (ref 0–44)
AST: 22 U/L (ref 15–41)
Albumin: 3.4 g/dL — ABNORMAL LOW (ref 3.5–5.0)
Alkaline Phosphatase: 51 U/L (ref 38–126)
Anion gap: 7 (ref 5–15)
BUN: 19 mg/dL (ref 8–23)
CO2: 29 mmol/L (ref 22–32)
Calcium: 8.6 mg/dL — ABNORMAL LOW (ref 8.9–10.3)
Chloride: 96 mmol/L — ABNORMAL LOW (ref 98–111)
Creatinine, Ser: 0.76 mg/dL (ref 0.61–1.24)
GFR, Estimated: >60 mL/min (ref >60)
Glucose, Bld: 74 mg/dL (ref 70–99)
Potassium: 4.4 mmol/L (ref 3.5–5.1)
Sodium: 132 mmol/L — ABNORMAL LOW (ref 135–145)
Total Bilirubin: 0.8 mg/dL (ref 0.3–1.2)
Total Protein: 6.2 g/dL — ABNORMAL LOW (ref 6.5–8.1)

## 2020-11-05 LAB — C-REACTIVE PROTEIN: CRP: 1.5 mg/dL — ABNORMAL HIGH (ref <1.0)

## 2020-11-05 LAB — VITAMIN B12: Vitamin B-12: 928 pg/mL — ABNORMAL HIGH (ref 180–914)

## 2020-11-05 LAB — MAGNESIUM: Magnesium: 2.1 mg/dL (ref 1.7–2.4)

## 2020-11-05 LAB — SEDIMENTATION RATE: Sed Rate: 46 mm/hr — ABNORMAL HIGH (ref 0–20)

## 2020-11-05 NOTE — Patient Instructions (Signed)
Medication Instructions:  Your physician recommends that you continue on your current medications as directed. Please refer to the Current Medication list given to you today.  *If you need a refill on your cardiac medications before your next appointment, please call your pharmacy*   Lab Work: None ordered If you have labs (blood work) drawn today and your tests are completely normal, you will receive your results only by: Marland Kitchen MyChart Message (if you have MyChart) OR . A paper copy in the mail If you have any lab test that is abnormal or we need to change your treatment, we will call you to review the results.   Testing/Procedures: None ordered   Follow-Up: At [facility], you and your health needs are our priority.  As part of our continuing mission to provide you with exceptional heart care, we have created designated Provider Care Teams.  These Care Teams include your primary Cardiologist (physician) and Advanced Practice Providers (APPs -  Physician Assistants and Nurse Practitioners) who all work together to provide you with the care you need, when you need it.  We recommend signing up for the patient portal called "MyChart".  Sign up information is provided on this After Visit Summary.  MyChart is used to connect with patients for Virtual Visits (Telemedicine).  Patients are able to view lab/test results, encounter notes, upcoming appointments, etc.  Non-urgent messages can be sent to your provider as well.   To learn more about what you can do with MyChart, go to [email or web address].    Your next appointment:   6 month(s)  The format for your next appointment:   In Person  Provider:   You may see Kathlyn Sacramento, MD or one of the following Advanced Practice Providers on your designated Care Team:    Murray Hodgkins, NP  Christell Faith, PA-C  Marrianne Mood, PA-C  Cadence [place], [place]  Laurann Montana, NP    Other Instructions:  You are cleared from a cardiac  standpoint for your upcoming Spinal injection for interventional pain management.  HOLD Eliquis 2 days prior to your scheduled procedure(injection) resume when  Dr.Naveira instructs you that it is safe.

## 2020-11-05 NOTE — Telephone Encounter (Signed)
Patient daughter called and informed that we have to get clearance from [place] to stop before we can tell him when to stop.

## 2020-11-05 NOTE — Telephone Encounter (Signed)
He has a procedure scheduled for 11/24/20 with Dr. Dossie Arbour. His daughter said Dr. Dossie Arbour told them about stopping blood thinners but didn't say how long etc. Please call with instructions.

## 2020-11-05 NOTE — Progress Notes (Unsigned)
Cardiology Office Note   Date:  11/05/2020   ID:  [name], DOB [birth year], MRN [id number]  PCP:  [name], MD  Cardiologist:   Kathlyn Sacramento, MD   Chief Complaint  Patient presents with  . Other    6 month f/u no complaints today. Meds reviewed verbally with pt.      History of Present Illness: [name] is a 86 y.o. male who presents for a follow-up visit regarding atrial fibrillation .The patient has chronic medical conditions that include rheumatoid arthritis, history of TIA, hyperlipidemia, chronic hyponatremia due to suspected SIADH and osteoarthritis. He had an echocardiogram done in June 2019 which showed normal LV systolic function with very mild aortic stenosis and mild mitral regurgitation. He had worsening heart failure in 2020.  He was noted to be in atrial fibrillation.  Repeat echocardiogram showed normal LV systolic function with mild pulmonary hypertension.  He was switched from Plavix to Eliquis 5 mg twice daily and was also started on metoprolol 25 mg twice daily.   He was thought to have diastolic heart failure related to atrial fibrillation.  He had successful cardioversion in May of last year.   He had right hand-assisted laparoscopic nephrectomy for kidney mass.    Most recent echocardiogram in June 2021 showed an EF of 60 to 75%, grade 2 diastolic dysfunction, mild pulmonary hypertension, mild aortic stenosis and mildly dilated aortic root.  He has been doing well with no recent chest pain, shortness of breath or palpitations.  He is scheduled for spinal injection in the near future for back pain.  Past Medical History:  Diagnosis Date  . (HFpEF) heart failure with preserved ejection fraction ([place])    a. 02/2019 Echo: Ef 60-65%, mildly reduced RV fxn. RVSP 50mmHg. Mildly dil LA. Mod dil RA. Mild to mod TR. Mild to mod AS. Triv AI.  [name] Allergy   . Anemia   . Anxiety   . Arthritis    rheumatoid  . Asthma   . Basal cell carcinoma    back   . CAD (coronary artery disease)   . CHF (congestive heart failure) ([place])   . Chronic kidney disease    Mass right kidney  . Chronic sinusitis   . Collagen vascular disease (HCC)    Rheumatoid Arthritis  . Depression   . Diverticulitis   . Dysrhythmia   . ED (erectile dysfunction)   . GERD (gastroesophageal reflux disease)   . History of SIADH   . Hx of adenomatous colonic polyps   . Hypertension   . Hyponatremia   . IBS (irritable bowel syndrome)   . Neurodermatitis   . Neurogenic bladder   . OSA (obstructive sleep apnea)    no CPAP since weight loss  . Persistent atrial fibrillation ([place])    a. Dx 02/2019; b. CHA2DS2VASc = 6-->eliquis initiated.  . Prostate cancer ([place])   . Renal mass    10/2018 4.2cm R renal cortical mass. Most compatible w/ clear cell renal cell carcinoma.    Past Surgical History:  Procedure Laterality Date  . CARDIOVERSION N/A 04/23/2019   Procedure: CARDIOVERSION;  Surgeon: Nelva Bush, MD;  Location: [facility] ORS;  Service: Cardiovascular;  Laterality: N/A;  . KNEE ARTHROPLASTY Right 09/02/2015   Procedure: COMPUTER ASSISTED TOTAL KNEE ARTHROPLASTY;  Surgeon: [name], MD;  Location: [facility] ORS;  Service: Orthopedics;  Laterality: Right;  . LAPAROSCOPIC NEPHRECTOMY, HAND ASSISTED Right 06/17/2019   Procedure: HAND ASSISTED LAPAROSCOPIC NEPHRECTOMY;  Surgeon: Hollice Espy, MD;  Location: [facility] ORS;  Service: Urology;  Laterality: Right;  . NASAL SINUS SURGERY  2009   DEVIATED SEPTUM AND POLYPS  . permanent indwelling catheter    . PROSTATE SURGERY     PROSTATECTOMY  . TOTAL KNEE ARTHROPLASTY Left 9/15   Dr [name]  . URETHRAL STRICTURE DILATATION  02-2010   Dr.Cope     Current Outpatient Medications  Medication Sig Dispense Refill  . acetaminophen (TYLENOL) 500 MG tablet Take 500 mg by mouth every 8 (eight) hours as needed for moderate pain or headache.     . alendronate (FOSAMAX) 70 MG tablet Take 70 mg by mouth once a week. Will start  after dental implant.    . ALOE PO Take 400 mg by mouth daily.     [name] ALPRAZolam (XANAX) 0.5 MG tablet TAKE 1 TABLET BY MOUTH THREE TIMES DAILY AS NEEDED FOR ANXIETY OR SLEEP (Patient taking differently: Take 1 mg by mouth at bedtime. Takes primarily at night for sleep.  Or prn anxiety) 90 tablet 0  . Ascorbic Acid (VITAMIN C PO) Take 1,000 mg by mouth daily.     . Calcium-Magnesium-Zinc 333-133-5 MG TABS Take 1 tablet by mouth daily.    . chlorhexidine (PERIDEX) 0.12 % solution 1 mL by Mouth Rinse route as needed.    . Cholecalciferol (VITAMIN D3) 5000 UNITS CAPS Take 2,000 Units by mouth daily.     . Coenzyme Q10 (CO Q 10) 100 MG CAPS Take 100 mg by mouth daily.     [name] CRANBERRY SOFT PO Take 25,200 mg by mouth 2 (two) times daily. Takes 2 capsules once a day.    [name] ELIQUIS 5 MG TABS tablet TAKE 1 TABLET(5 MG) BY MOUTH TWICE DAILY 60 tablet 6  . EPINEPHrine 0.3 mg/0.3 mL IJ SOAJ injection Inject 0.3 mg into the muscle as needed for anaphylaxis.    . fluticasone (FLONASE) 50 MCG/ACT nasal spray Place 2 sprays into both nostrils 2 (two) times daily. 48 g 3  . folic acid (FOLVITE) 606 MCG tablet Take 1,600 mcg by mouth daily.     . furosemide (LASIX) 20 MG tablet Take 1 tablet (20 mg total) by mouth daily. 90 tablet 0  . gabapentin (NEURONTIN) 300 MG capsule Take 1 capsule (300 mg total) by mouth 3 (three) times daily. (Patient taking differently: Take 300 mg by mouth 4 (four) times daily. 600 mg a.m., 300 lunch, 600 p.m.) 90 capsule 11  . hydroxychloroquine (PLAQUENIL) 200 MG tablet Take 200 mg by mouth 2 (two) times daily.     [name] ketoconazole (NIZORAL) 2 % shampoo     . methotrexate (RHEUMATREX) 2.5 MG tablet Take 10 tablets by mouth once a week.    . milk thistle 175 MG tablet Take 175 mg by mouth daily.    . Multiple Vitamin (MULTIVITAMIN) tablet Take 1 tablet by mouth daily.    . Multiple Vitamins-Minerals (BALANCED CARE SENIORS PO) Take 6 tablets by mouth daily. 3 tablets of fruit and 3 tablets  of vegetables daily  Balance of Nature    . MYRBETRIQ 50 MG TB24 tablet TAKE 1 TABLET(50 MG) BY MOUTH DAILY 30 tablet 3  . Omega-3 Fatty Acids (FISH OIL) 1200 MG CAPS Take 1,200 mg by mouth daily.    [name] omeprazole (PRILOSEC) 20 MG capsule Take 20 mg by mouth daily as needed.    [name] PHOSPHATIDYL CHOLINE PO Take 1 tablet by mouth daily.    . polyethylene glycol powder (  GLYCOLAX/MIRALAX) 17 GM/SCOOP powder Take 17 g by mouth at bedtime.    . pravastatin (PRAVACHOL) 20 MG tablet TAKE 1 TABLET(20 MG) BY MOUTH AT BEDTIME 90 tablet 1  . Probiotic Product (ALIGN) 4 MG CAPS Take 1 capsule (4 mg total) by mouth daily. 15 capsule 0  . Selenium 200 MCG CAPS Take 200 mcg by mouth daily.     . sodium chloride 1 g tablet Take 1 tablet (1 g total) by mouth 2 (two) times daily with a meal. (Patient taking differently: Take 1 g by mouth 3 (three) times daily with meals.) 60 tablet 1  . triamcinolone ointment (KENALOG) 0.1 % Apply 1 application topically as needed.    . Turmeric 500 MG CAPS Take 500 mg by mouth daily.     . vitamin B-12 (CYANOCOBALAMIN) 1000 MCG tablet Take 1,000 mcg by mouth daily.    . Zinc 50 MG TABS Take by mouth.    . metoprolol tartrate (LOPRESSOR) 25 MG tablet Take 0.5 tablets (12.5 mg total) by mouth 2 (two) times daily. 90 tablet 3   Current Facility-Administered Medications  Medication Dose Route Frequency Provider Last Rate Last Admin  . omalizumab Arvid Right) prefilled syringe 300 mg  300 mg Subcutaneous Once Chesley Mires, MD      . omalizumab Arvid Right) prefilled syringe 75 mg  75 mg Subcutaneous Once Chesley Mires, MD        Allergies:   Ciprofloxacin, Citalopram hydrobromide, Clindamycin, Lorazepam, Paroxetine, Ramipril, Simvastatin, and Sulfa antibiotics    Social History:  The patient  reports that he has never smoked. He has never used smokeless tobacco. He reports current alcohol use. He reports that he does not use drugs.   Family History:  The patient's family history includes  Heart disease (age of onset: 75) in his mother; Pneumonia (age of onset: 38) in his father; Skin cancer in his father, sister, and son.    ROS:  Please see the history of present illness.   Otherwise, review of systems are positive for none.   All other systems are reviewed and negative.    PHYSICAL EXAM: VS:  BP 118/60 (BP Location: Left Arm, Patient Position: Sitting, Cuff Size: Normal)   Pulse (!) 53   Ht 5\' 9"  (1.753 m)   Wt 200 lb (90.7 kg)   SpO2 96%   BMI 29.53 kg/m  , BMI Body mass index is 29.53 kg/m. GEN: Well nourished, well developed, in no acute distress  HEENT: normal  Neck: no JVD, carotid bruits, or masses Cardiac: Regular rate and rhythm; no  rubs, or gallops .  2/ 6 systolic murmur in the aortic area which is mid peaking.  Mild bilateral leg edema Respiratory:  clear to auscultation bilaterally, normal work of breathing GI: soft, nontender, nondistended, + BS MS: no deformity or atrophy  Skin: warm and dry, no rash Neuro:  Strength and sensation are intact Psych: euthymic mood, full affect   EKG:  EKG is ordered today. The ekg ordered today demonstrates sinus bradycardia with first-degree AV block, left axis deviation and right bundle branch block.   Recent Labs: 05/11/2020: ALT 14; BUN 21; Creatinine, Ser 0.99; Hemoglobin 11.4; Platelets 183.0; Potassium 4.3; Sodium 131    Lipid Panel    Component Value Date/Time   CHOL 115 05/11/2020 1648   TRIG 60.0 05/11/2020 1648   HDL 56.50 05/11/2020 1648   CHOLHDL 2 05/11/2020 1648   VLDL 12.0 05/11/2020 1648   LDLCALC 47 05/11/2020 1648  Wt Readings from Last 3 Encounters:  11/05/20 200 lb (90.7 kg)  11/04/20 200 lb (90.7 kg)  08/24/20 201 lb (91.2 kg)       PAD Screen 12/28/2018  Previous PAD dx? No  Previous surgical procedure? (No Data)  Pain with walking? Yes  Subsides with rest? Yes  Feet/toe relief with dangling? No  Painful, non-healing ulcers? No  Extremities discolored? Yes       ASSESSMENT AND PLAN:  1.  Persistent atrial fibrillation: Maintaining sinus rhythm post cardioversion.  Continue small dose metoprolol.  He is tolerating anticoagulation with Eliquis.  He is getting labs done today but his renal function has been in the normal range.  2.  Chronic diastolic heart failure: He appears to be euvolemic on current dose of furosemide 20 mg daily.  3.  Aortic stenosis: This was mild on most recent echocardiogram.  Repeat echocardiogram in June 2022  4.  Preop cardiovascular evaluation for spinal injection: Overall low risk from cardiovascular standpoint.  Hold Eliquis 2 days before.   Disposition:   FU in 6 months.  Signed,  Kathlyn Sacramento, MD  11/05/2020 3:40 PM    [facility]

## 2020-11-06 DIAGNOSIS — I4891 Unspecified atrial fibrillation: Secondary | ICD-10-CM | POA: Diagnosis not present

## 2020-11-06 DIAGNOSIS — N319 Neuromuscular dysfunction of bladder, unspecified: Secondary | ICD-10-CM | POA: Diagnosis not present

## 2020-11-06 DIAGNOSIS — J449 Chronic obstructive pulmonary disease, unspecified: Secondary | ICD-10-CM | POA: Diagnosis not present

## 2020-11-06 DIAGNOSIS — Z466 Encounter for fitting and adjustment of urinary device: Secondary | ICD-10-CM | POA: Diagnosis not present

## 2020-11-06 DIAGNOSIS — I10 Essential (primary) hypertension: Secondary | ICD-10-CM | POA: Diagnosis not present

## 2020-11-06 DIAGNOSIS — M62830 Muscle spasm of back: Secondary | ICD-10-CM | POA: Diagnosis not present

## 2020-11-06 DIAGNOSIS — M810 Age-related osteoporosis without current pathological fracture: Secondary | ICD-10-CM | POA: Diagnosis not present

## 2020-11-06 DIAGNOSIS — C61 Malignant neoplasm of prostate: Secondary | ICD-10-CM | POA: Diagnosis not present

## 2020-11-06 DIAGNOSIS — M5116 Intervertebral disc disorders with radiculopathy, lumbar region: Secondary | ICD-10-CM | POA: Diagnosis not present

## 2020-11-09 DIAGNOSIS — N319 Neuromuscular dysfunction of bladder, unspecified: Secondary | ICD-10-CM | POA: Diagnosis not present

## 2020-11-09 DIAGNOSIS — I4891 Unspecified atrial fibrillation: Secondary | ICD-10-CM | POA: Diagnosis not present

## 2020-11-09 DIAGNOSIS — M5116 Intervertebral disc disorders with radiculopathy, lumbar region: Secondary | ICD-10-CM | POA: Diagnosis not present

## 2020-11-09 DIAGNOSIS — M810 Age-related osteoporosis without current pathological fracture: Secondary | ICD-10-CM | POA: Diagnosis not present

## 2020-11-09 DIAGNOSIS — I10 Essential (primary) hypertension: Secondary | ICD-10-CM | POA: Diagnosis not present

## 2020-11-09 DIAGNOSIS — M62830 Muscle spasm of back: Secondary | ICD-10-CM | POA: Diagnosis not present

## 2020-11-09 DIAGNOSIS — C61 Malignant neoplasm of prostate: Secondary | ICD-10-CM | POA: Diagnosis not present

## 2020-11-09 DIAGNOSIS — J449 Chronic obstructive pulmonary disease, unspecified: Secondary | ICD-10-CM | POA: Diagnosis not present

## 2020-11-09 DIAGNOSIS — Z466 Encounter for fitting and adjustment of urinary device: Secondary | ICD-10-CM | POA: Diagnosis not present

## 2020-11-11 LAB — COMPLIANCE DRUG ANALYSIS, UR

## 2020-11-12 DIAGNOSIS — M62830 Muscle spasm of back: Secondary | ICD-10-CM | POA: Diagnosis not present

## 2020-11-12 DIAGNOSIS — J449 Chronic obstructive pulmonary disease, unspecified: Secondary | ICD-10-CM | POA: Diagnosis not present

## 2020-11-12 DIAGNOSIS — N319 Neuromuscular dysfunction of bladder, unspecified: Secondary | ICD-10-CM | POA: Diagnosis not present

## 2020-11-12 DIAGNOSIS — I10 Essential (primary) hypertension: Secondary | ICD-10-CM | POA: Diagnosis not present

## 2020-11-12 DIAGNOSIS — C61 Malignant neoplasm of prostate: Secondary | ICD-10-CM | POA: Diagnosis not present

## 2020-11-12 DIAGNOSIS — Z466 Encounter for fitting and adjustment of urinary device: Secondary | ICD-10-CM | POA: Diagnosis not present

## 2020-11-12 DIAGNOSIS — M810 Age-related osteoporosis without current pathological fracture: Secondary | ICD-10-CM | POA: Diagnosis not present

## 2020-11-12 DIAGNOSIS — M5116 Intervertebral disc disorders with radiculopathy, lumbar region: Secondary | ICD-10-CM | POA: Diagnosis not present

## 2020-11-12 DIAGNOSIS — I4891 Unspecified atrial fibrillation: Secondary | ICD-10-CM | POA: Diagnosis not present

## 2020-11-12 LAB — 25-HYDROXY VITAMIN D LCMS D2+D3
25-Hydroxy, Vitamin D-2: <1 ng/mL
25-Hydroxy, Vitamin D-3: 45 ng/mL
25-Hydroxy, Vitamin D: 45 ng/mL

## 2020-11-17 ENCOUNTER — Ambulatory Visit (INDEPENDENT_AMBULATORY_CARE_PROVIDER_SITE_OTHER): Payer: Medicare PPO

## 2020-11-17 ENCOUNTER — Other Ambulatory Visit: Payer: Self-pay

## 2020-11-17 DIAGNOSIS — J4550 Severe persistent asthma, uncomplicated: Secondary | ICD-10-CM

## 2020-11-17 MED ORDER — OMALIZUMAB 75 MG/0.5ML ~~LOC~~ SOSY
75.0000 mg | PREFILLED_SYRINGE | Freq: Once | SUBCUTANEOUS | Status: AC
  Administered 2020-11-17: 16:00:00 75 mg via SUBCUTANEOUS

## 2020-11-17 MED ORDER — OMALIZUMAB 150 MG/ML ~~LOC~~ SOSY
300.0000 mg | PREFILLED_SYRINGE | Freq: Once | SUBCUTANEOUS | Status: AC
  Administered 2020-11-17: 16:00:00 300 mg via SUBCUTANEOUS

## 2020-11-18 DIAGNOSIS — Z466 Encounter for fitting and adjustment of urinary device: Secondary | ICD-10-CM | POA: Diagnosis not present

## 2020-11-18 DIAGNOSIS — C61 Malignant neoplasm of prostate: Secondary | ICD-10-CM | POA: Diagnosis not present

## 2020-11-18 DIAGNOSIS — M810 Age-related osteoporosis without current pathological fracture: Secondary | ICD-10-CM | POA: Diagnosis not present

## 2020-11-18 DIAGNOSIS — I10 Essential (primary) hypertension: Secondary | ICD-10-CM | POA: Diagnosis not present

## 2020-11-18 DIAGNOSIS — N319 Neuromuscular dysfunction of bladder, unspecified: Secondary | ICD-10-CM | POA: Diagnosis not present

## 2020-11-18 DIAGNOSIS — I4891 Unspecified atrial fibrillation: Secondary | ICD-10-CM | POA: Diagnosis not present

## 2020-11-18 DIAGNOSIS — M5116 Intervertebral disc disorders with radiculopathy, lumbar region: Secondary | ICD-10-CM | POA: Diagnosis not present

## 2020-11-18 DIAGNOSIS — M62830 Muscle spasm of back: Secondary | ICD-10-CM | POA: Diagnosis not present

## 2020-11-18 DIAGNOSIS — J449 Chronic obstructive pulmonary disease, unspecified: Secondary | ICD-10-CM | POA: Diagnosis not present

## 2020-11-23 ENCOUNTER — Telehealth: Payer: Self-pay | Admitting: Pulmonary Disease

## 2020-11-23 NOTE — Telephone Encounter (Signed)
Xolair Prefilled Syringe Order: 150mg  Prefilled Syringe:  #4 75mg  Prefilled Syringe: #n/a-have  Ordered Date: 11/23/20 Expected date of arrival: 11/24/20 Ordered by: Morris Longenecker,LPN Specialty Pharmacy: 11/25/20

## 2020-11-24 ENCOUNTER — Other Ambulatory Visit: Payer: Self-pay

## 2020-11-24 ENCOUNTER — Ambulatory Visit (HOSPITAL_BASED_OUTPATIENT_CLINIC_OR_DEPARTMENT_OTHER): Payer: Medicare PPO | Admitting: Pain Medicine

## 2020-11-24 ENCOUNTER — Encounter: Payer: Self-pay | Admitting: Pain Medicine

## 2020-11-24 ENCOUNTER — Ambulatory Visit
Admission: RE | Admit: 2020-11-24 | Discharge: 2020-11-24 | Disposition: A | Discharge status: Home / Self Care | Payer: Medicare PPO | Source: Ambulatory Visit | Attending: Pain Medicine | Admitting: Pain Medicine

## 2020-11-24 ENCOUNTER — Ambulatory Visit: Payer: Medicare PPO | Admitting: Pain Medicine

## 2020-11-24 VITALS — BP 134/62 | HR 64 | Temp 97.1°F | Resp 16 | Ht 69.0 in | Wt 200.0 lb

## 2020-11-24 DIAGNOSIS — R700 Elevated erythrocyte sedimentation rate: Secondary | ICD-10-CM

## 2020-11-24 DIAGNOSIS — M47817 Spondylosis without myelopathy or radiculopathy, lumbosacral region: Secondary | ICD-10-CM

## 2020-11-24 DIAGNOSIS — G8929 Other chronic pain: Secondary | ICD-10-CM

## 2020-11-24 DIAGNOSIS — R7982 Elevated C-reactive protein (CRP): Secondary | ICD-10-CM

## 2020-11-24 DIAGNOSIS — M4187 Other forms of scoliosis, lumbosacral region: Secondary | ICD-10-CM | POA: Insufficient documentation

## 2020-11-24 DIAGNOSIS — M47816 Spondylosis without myelopathy or radiculopathy, lumbar region: Secondary | ICD-10-CM | POA: Insufficient documentation

## 2020-11-24 DIAGNOSIS — M5137 Other intervertebral disc degeneration, lumbosacral region: Secondary | ICD-10-CM | POA: Diagnosis not present

## 2020-11-24 DIAGNOSIS — Z7901 Long term (current) use of anticoagulants: Secondary | ICD-10-CM | POA: Insufficient documentation

## 2020-11-24 DIAGNOSIS — M51379 Other intervertebral disc degeneration, lumbosacral region without mention of lumbar back pain or lower extremity pain: Secondary | ICD-10-CM

## 2020-11-24 DIAGNOSIS — M545 Low back pain, unspecified: Secondary | ICD-10-CM | POA: Insufficient documentation

## 2020-11-24 DIAGNOSIS — M4310 Spondylolisthesis, site unspecified: Secondary | ICD-10-CM

## 2020-11-24 MED ORDER — TRIAMCINOLONE ACETONIDE 40 MG/ML IJ SUSP
80.0000 mg | Freq: Once | INTRAMUSCULAR | Status: AC
  Administered 2020-11-24: 14:00:00 80 mg
  Filled 2020-11-24: qty 2

## 2020-11-24 MED ORDER — MIDAZOLAM HCL 5 MG/5ML IJ SOLN
1.0000 mg | INTRAMUSCULAR | Status: DC | PRN
  Administered 2020-11-24: 14:00:00 1 mg via INTRAVENOUS
  Filled 2020-11-24: qty 5

## 2020-11-24 MED ORDER — LIDOCAINE HCL 2 % IJ SOLN: 20.0000 mL | Freq: Once | INTRAMUSCULAR | Status: DC

## 2020-11-24 MED ORDER — FENTANYL CITRATE (PF) 100 MCG/2ML IJ SOLN
25.0000 ug | INTRAMUSCULAR | Status: DC | PRN
  Administered 2020-11-24: 50 ug via INTRAVENOUS
  Filled 2020-11-24: qty 2

## 2020-11-24 MED ORDER — LACTATED RINGERS IV SOLN
1000.0000 mL | Freq: Once | INTRAVENOUS | Status: AC
  Administered 2020-11-24: 14:00:00 1000 mL via INTRAVENOUS

## 2020-11-24 MED ORDER — ROPIVACAINE HCL 2 MG/ML IJ SOLN
18.0000 mL | Freq: Once | INTRAMUSCULAR | Status: AC
  Administered 2020-11-24: 14:00:00 18 mL via PERINEURAL

## 2020-11-24 NOTE — Progress Notes (Signed)
PROVIDER NOTE: Information contained herein reflects review and annotations entered in association with encounter. Interpretation of such information and data should be left to medically-trained personnel. Information provided to patient can be located elsewhere in the medical record under "Patient Instructions". Document created using STT-dictation technology, any transcriptional errors that may result from process are unintentional.    Patient: [name]  Service Category: Procedure  Provider: Gaspar Cola, MD  DOB: 12-May-1934  DOS: 11/24/2020  Location: [facility]  MRN: [id number]  Setting: Ambulatory - outpatient  Referring Provider: [name], MD  Type: Established Patient  Specialty: Interventional Pain Management  PCP: [name], MD   Primary Reason for Visit: Interventional Pain Management Treatment. CC: No chief complaint on file.  Procedure:          Anesthesia, Analgesia, Anxiolysis:  Type: Lumbar Facet, Medial Branch Block(s) #1  Primary Purpose: Diagnostic Region: Posterolateral Lumbosacral Spine Level: L2, L3, L4, L5, & S1 Medial Branch Level(s). Injecting these levels blocks the L3-4, L4-5, and L5-S1 lumbar facet joints. Laterality: Bilateral  Type: Moderate (Conscious) Sedation combined with Local Anesthesia Indication(s): Analgesia and Anxiety Route: Intravenous (IV) IV Access: Secured Sedation: Meaningful verbal contact was maintained at all times during the procedure  Local Anesthetic: Lidocaine 1-2%  Position: Prone   Indications: 1. Lumbar facet syndrome   2. Spondylosis without myelopathy or radiculopathy, lumbosacral region   3. Lumbar facet hypertrophy   4. Levoscoliosis of lumbosacral spine   5. Grade 1 Retrolisthesis at L2-3 and L3-4   6. DDD (degenerative disc disease), lumbosacral   7. Chronic low back pain (Bilateral) w/o sciatica   8. Chronic anticoagulation (Eliquis)    Pain Score: Pre-procedure: 4  /10 Post-procedure: 0-No pain/10   Pre-op H&P Assessment:  Mr. Gholar is a 86 y.o. (year old), male patient, seen today for interventional treatment. He  has a past surgical history that includes Prostate surgery; Nasal sinus surgery (2009); Urethra dilation [id number]); Total knee arthroplasty (Left, 9/15); Knee Arthroplasty (Right, 09/02/2015); permanent indwelling catheter; Cardioversion (N/A, 04/23/2019); and Laparoscopic nephrectomy, hand assisted (Right, 06/17/2019). Mr. Rensberger has a current medication list which includes the following prescription(s): acetaminophen, alendronate, aloe, alprazolam, ascorbic acid, calcium-magnesium-zinc, chlorhexidine, vitamin d3, co q 10, cranberry, epinephrine, fluticasone, folic acid, furosemide, gabapentin, hydroxychloroquine, ketoconazole, methotrexate, milk thistle, multivitamin, multiple vitamins-minerals, myrbetriq, fish oil, omeprazole, choline, polyethylene glycol powder, pravastatin, align, selenium, sodium chloride, triamcinolone ointment, turmeric, vitamin b-12, zinc, eliquis, and metoprolol tartrate, and the following Facility-Administered Medications: fentanyl, lidocaine, midazolam, omalizumab, and omalizumab. His primarily concern today is the No chief complaint on file.  Initial Vital Signs:  Pulse/HCG Rate: 64ECG Heart Rate: (!) 52 Temp: (!) 97.1 F (36.2 C) Resp: 18 BP: (!) 111/52 SpO2: 98 %  BMI: Estimated body mass index is 29.53 kg/m as calculated from the following:   Height as of this encounter: 5\' 9"  (1.753 m).   Weight as of this encounter: 200 lb (90.7 kg).  Risk Assessment: Allergies: Reviewed. He is allergic to ciprofloxacin, citalopram hydrobromide, clindamycin, lorazepam, paroxetine, ramipril, simvastatin, and sulfa antibiotics.  Allergy Precautions: None required Coagulopathies: Reviewed. None identified.  Blood-thinner therapy: None at this time Active Infection(s): Reviewed. None identified. Mr. Czaplicki is afebrile  Site  Confirmation: Mr. Aupperle was asked to confirm the procedure and laterality before marking the site Procedure checklist: Completed Consent: Before the procedure and under the influence of no sedative(s), amnesic(s), or anxiolytics, the patient was informed of the treatment options, risks and possible complications.  To fulfill our ethical and legal obligations, as recommended by the American Medical Association's Code of Ethics, I have informed the patient of my clinical impression; the nature and purpose of the treatment or procedure; the risks, benefits, and possible complications of the intervention; the alternatives, including doing nothing; the risk(s) and benefit(s) of the alternative treatment(s) or procedure(s); and the risk(s) and benefit(s) of doing nothing. The patient was provided information about the general risks and possible complications associated with the procedure. These may include, but are not limited to: failure to achieve desired goals, infection, bleeding, organ or nerve damage, allergic reactions, paralysis, and death. In addition, the patient was informed of those risks and complications associated to Spine-related procedures, such as failure to decrease pain; infection (i.e.: Meningitis, epidural or intraspinal abscess); bleeding (i.e.: epidural hematoma, subarachnoid hemorrhage, or any other type of intraspinal or peri-dural bleeding); organ or nerve damage (i.e.: Any type of peripheral nerve, nerve root, or spinal cord injury) with subsequent damage to sensory, motor, and/or autonomic systems, resulting in permanent pain, numbness, and/or weakness of one or several areas of the body; allergic reactions; (i.e.: anaphylactic reaction); and/or death. Furthermore, the patient was informed of those risks and complications associated with the medications. These include, but are not limited to: allergic reactions (i.e.: anaphylactic or anaphylactoid reaction(s)); adrenal axis suppression;  blood sugar elevation that in diabetics may result in ketoacidosis or comma; water retention that in patients with history of congestive heart failure may result in shortness of breath, pulmonary edema, and decompensation with resultant heart failure; weight gain; swelling or edema; medication-induced neural toxicity; particulate matter embolism and blood vessel occlusion with resultant organ, and/or nervous system infarction; and/or aseptic necrosis of one or more joints. Finally, the patient was informed that Medicine is not an exact science; therefore, there is also the possibility of unforeseen or unpredictable risks and/or possible complications that may result in a catastrophic outcome. The patient indicated having understood very clearly. We have given the patient no guarantees and we have made no promises. Enough time was given to the patient to ask questions, all of which were answered to the patient's satisfaction. Mr. Carias has indicated that he wanted to continue with the procedure. Attestation: I, the ordering provider, attest that I have discussed with the patient the benefits, risks, side-effects, alternatives, likelihood of achieving goals, and potential problems during recovery for the procedure that I have provided informed consent. Date  Time: 11/24/2020  1:02 PM  Pre-Procedure Preparation:  Monitoring: As per clinic protocol. Respiration, ETCO2, SpO2, BP, heart rate and rhythm monitor placed and checked for adequate function Safety Precautions: Patient was assessed for positional comfort and pressure points before starting the procedure. Time-out: I initiated and conducted the "Time-out" before starting the procedure, as per protocol. The patient was asked to participate by confirming the accuracy of the "Time Out" information. Verification of the correct person, site, and procedure were performed and confirmed by me, the nursing staff, and the patient. "Time-out" conducted as per Joint  Commission's Universal Protocol (UP.01.01.01). Time: 1352  Description of Procedure:          Laterality: Bilateral. The procedure was performed in identical fashion on both sides. Levels:  L2, L3, L4, L5, & S1 Medial Branch Level(s) Area Prepped: Posterior Lumbosacral Region DuraPrep (Iodine Povacrylex [0.7% available iodine] and Isopropyl Alcohol, 74% w/w) Safety Precautions: Aspiration looking for blood return was conducted prior to all injections. At no point did we inject any substances, as a needle was  being advanced. Before injecting, the patient was told to immediately notify me if he was experiencing any new onset of "ringing in the ears, or metallic taste in the mouth". No attempts were made at seeking any paresthesias. Safe injection practices and needle disposal techniques used. Medications properly checked for expiration dates. SDV (single dose vial) medications used. After the completion of the procedure, all disposable equipment used was discarded in the proper designated medical waste containers. Local Anesthesia: Protocol guidelines were followed. The patient was positioned over the fluoroscopy table. The area was prepped in the usual manner. The time-out was completed. The target area was identified using fluoroscopy. A 12-in long, straight, sterile hemostat was used with fluoroscopic guidance to locate the targets for each level blocked. Once located, the skin was marked with an approved surgical skin marker. Once all sites were marked, the skin (epidermis, dermis, and hypodermis), as well as deeper tissues (fat, connective tissue and muscle) were infiltrated with a small amount of a short-acting local anesthetic, loaded on a 10cc syringe with a 25G, 1.5-in  Needle. An appropriate amount of time was allowed for local anesthetics to take effect before proceeding to the next step. Local Anesthetic: Lidocaine 2.0% The unused portion of the local anesthetic was discarded in the proper  designated containers. Technical explanation of process:  L2 Medial Branch Nerve Block (MBB): The target area for the L2 medial branch is at the junction of the postero-lateral aspect of the superior articular process and the superior, posterior, and medial edge of the transverse process of L3. Under fluoroscopic guidance, a Quincke needle was inserted until contact was made with os over the superior postero-lateral aspect of the pedicular shadow (target area). After negative aspiration for blood, 0.5 mL of the nerve block solution was injected without difficulty or complication. The needle was removed intact. L3 Medial Branch Nerve Block (MBB): The target area for the L3 medial branch is at the junction of the postero-lateral aspect of the superior articular process and the superior, posterior, and medial edge of the transverse process of L4. Under fluoroscopic guidance, a Quincke needle was inserted until contact was made with os over the superior postero-lateral aspect of the pedicular shadow (target area). After negative aspiration for blood, 0.5 mL of the nerve block solution was injected without difficulty or complication. The needle was removed intact. L4 Medial Branch Nerve Block (MBB): The target area for the L4 medial branch is at the junction of the postero-lateral aspect of the superior articular process and the superior, posterior, and medial edge of the transverse process of L5. Under fluoroscopic guidance, a Quincke needle was inserted until contact was made with os over the superior postero-lateral aspect of the pedicular shadow (target area). After negative aspiration for blood, 0.5 mL of the nerve block solution was injected without difficulty or complication. The needle was removed intact. L5 Medial Branch Nerve Block (MBB): The target area for the L5 medial branch is at the junction of the postero-lateral aspect of the superior articular process and the superior, posterior, and medial edge  of the sacral ala. Under fluoroscopic guidance, a Quincke needle was inserted until contact was made with os over the superior postero-lateral aspect of the pedicular shadow (target area). After negative aspiration for blood, 0.5 mL of the nerve block solution was injected without difficulty or complication. The needle was removed intact. S1 Medial Branch Nerve Block (MBB): The target area for the S1 medial branch is at the posterior and inferior 6 o'clock  position of the L5-S1 facet joint. Under fluoroscopic guidance, the Quincke needle inserted for the L5 MBB was redirected until contact was made with os over the inferior and postero aspect of the sacrum, at the 6 o' clock position under the L5-S1 facet joint (Target area). After negative aspiration for blood, 0.5 mL of the nerve block solution was injected without difficulty or complication. The needle was removed intact.  Nerve block solution: 0.2% PF-Ropivacaine + Triamcinolone (40 mg/mL) diluted to a final concentration of 4 mg of Triamcinolone/mL of Ropivacaine The unused portion of the solution was discarded in the proper designated containers. Procedural Needles: 22-gauge, 3.5-inch, Quincke needles used for all levels.  Once the entire procedure was completed, the treated area was cleaned, making sure to leave some of the prepping solution back to take advantage of its long term bactericidal properties.   Illustration of the posterior view of the lumbar spine and the posterior neural structures. Laminae of L2 through S1 are labeled. DPRL5, dorsal primary ramus of L5; DPRS1, dorsal primary ramus of S1; DPR3, dorsal primary ramus of L3; FJ, facet (zygapophyseal) joint L3-L4; I, inferior articular process of L4; LB1, lateral branch of dorsal primary ramus of L1; IAB, inferior articular branches from L3 medial branch (supplies L4-L5 facet joint); IBP, intermediate branch plexus; MB3, medial branch of dorsal primary ramus of L3; NR3, third lumbar nerve  root; S, superior articular process of L5; SAB, superior articular branches from L4 (supplies L4-5 facet joint also); TP3, transverse process of L3.  Vitals:   11/24/20 1401 11/24/20 1411 11/24/20 1421 11/24/20 1431  BP: (!) 165/75 (!) 144/59 139/61 134/62  Pulse:      Resp: 16 16 16 16   Temp:  (!) 97.2 F (36.2 C)  (!) 97.1 F (36.2 C)  TempSrc:      SpO2: 97% 100% 100% 100%  Weight:      Height:         Start Time: 1352 hrs. End Time: 1400 hrs.  Imaging Guidance (Spinal):          Type of Imaging Technique: Fluoroscopy Guidance (Spinal) Indication(s): Assistance in needle guidance and placement for procedures requiring needle placement in or near specific anatomical locations not easily accessible without such assistance. Exposure Time: Please see nurses notes. Contrast: None used. Fluoroscopic Guidance: I was personally present during the use of fluoroscopy. "Tunnel Vision Technique" used to obtain the best possible view of the target area. Parallax error corrected before commencing the procedure. "Direction-depth-direction" technique used to introduce the needle under continuous pulsed fluoroscopy. Once target was reached, antero-posterior, oblique, and lateral fluoroscopic projection used confirm needle placement in all planes. Images permanently stored in EMR. Interpretation: No contrast injected. I personally interpreted the imaging intraoperatively. Adequate needle placement confirmed in multiple planes. Permanent images saved into the patient's record.  Antibiotic Prophylaxis:   Anti-infectives (From admission, onward)   None     Indication(s): None identified  Post-operative Assessment:  Post-procedure Vital Signs:  Pulse/HCG Rate: 6471 Temp: (!) 97.1 F (36.2 C) Resp: 16 BP: 134/62 SpO2: 100 %  EBL: None  Complications: No immediate post-treatment complications observed by team, or reported by patient.  Note: The patient tolerated the entire procedure well.  A repeat set of vitals were taken after the procedure and the patient was kept under observation following institutional policy, for this type of procedure. Post-procedural neurological assessment was performed, showing return to baseline, prior to discharge. The patient was provided with post-procedure discharge instructions, including a  section on how to identify potential problems. Should any problems arise concerning this procedure, the patient was given instructions to immediately contact us, at any time, without hesitation. In any case, we plan to contact the patient by telephone for a follow-up status report regarding this interventional procedure.  Comments:  No additional relevant information.  Plan of Care  Orders:  Orders Placed This Encounter  Procedures  . LUMBAR FACET(MEDIAL BRANCH NERVE BLOCK) MBNB    Scheduling Instructions:     Procedure: Lumbar facet block (AKA.: Lumbosacral medial branch nerve block)     Side: Bilateral     Level: L3-4, L4-5, & L5-S1 Facets (L2, L3, L4, L5, & S1 Medial Branch Nerves)     Sedation: Patient's choice.     Timeframe: Today    Order Specific Question:   Where will this procedure be performed?    Answer:   [facility]  . [facility] C-ARM 1-60 MIN NO REPORT    Intraoperative interpretation by procedural physician at [facility].    Standing Status:   Standing    Number of Occurrences:   1    Order Specific Question:   Reason for exam:    Answer:   Assistance in needle guidance and placement for procedures requiring needle placement in or near specific anatomical locations not easily accessible without such assistance.  . Informed Consent Details: Physician/Practitioner Attestation; Transcribe to consent form and obtain patient signature    Nursing Order: Transcribe to consent form and obtain patient signature. Note: Always confirm laterality of pain with Mr. Bullen, before procedure.    Order Specific Question:    Physician/Practitioner attestation of informed consent for procedure/surgical case    Answer:   I, the physician/practitioner, attest that I have discussed with the patient the benefits, risks, side effects, alternatives, likelihood of achieving goals and potential problems during recovery for the procedure that I have provided informed consent.    Order Specific Question:   Procedure    Answer:   Lumbar Facet Block  under fluoroscopic guidance    Order Specific Question:   Physician/Practitioner performing the procedure    Answer:   Dreux Mcgroarty A. Dossie Arbour MD    Order Specific Question:   Indication/Reason    Answer:   Low Back Pain, with our without leg pain, due to Facet Joint Arthralgia (Joint Pain) Spondylosis (Arthritis of the Spine), without myelopathy or radiculopathy (Nerve Damage).  . Care order/instruction: Please confirm that the patient has stopped the Eliquis (Apixaban) x 3 days prior to procedure or surgery.    Please confirm that the patient has stopped the Eliquis (Apixaban) x 3 days prior to procedure or surgery.    Standing Status:   Standing    Number of Occurrences:   1  . Provide equipment / supplies at bedside    "Block Tray" (Disposable  single use) Needle type: SpinalSpinal Amount/quantity: 4 Size: Medium (5-inch) Gauge: 22G    Standing Status:   Standing    Number of Occurrences:   1    Order Specific Question:   Specify    Answer:   Block Tray  . Bleeding precautions    Standing Status:   Standing    Number of Occurrences:   1   Chronic Opioid Analgesic:  None MME/day: 0 mg/day   Medications ordered for procedure: Meds ordered this encounter  Medications  . lidocaine (XYLOCAINE) 2 % (with pres) injection 400 mg  . lactated ringers infusion 1,000 mL  .  midazolam (VERSED) 5 MG/5ML injection 1-2 mg    Make sure Flumazenil is available in the pyxis when using this medication. If oversedation occurs, administer 0.2 mg IV over 15 sec. If after 45 sec no response,  administer 0.2 mg again over 1 min; may repeat at 1 min intervals; not to exceed 4 doses (1 mg)  . fentaNYL (SUBLIMAZE) injection 25-50 mcg    Make sure Narcan is available in the pyxis when using this medication. In the event of respiratory depression (RR< 8/min): Titrate NARCAN (naloxone) in increments of 0.1 to 0.2 mg IV at 2-3 minute intervals, until desired degree of reversal.  . ropivacaine (PF) 2 mg/mL (0.2%) (NAROPIN) injection 18 mL  . triamcinolone acetonide (KENALOG-40) injection 80 mg   Medications administered: We administered lactated ringers, midazolam, fentaNYL, ropivacaine (PF) 2 mg/mL (0.2%), and triamcinolone acetonide.  See the medical record for exact dosing, route, and time of administration.  Follow-up plan:   Return in about 2 weeks (around 12/08/2020) for (F2F), (PP) Follow-up.      Interventional Therapies  Risk  Complexity Considerations:   WNL   Planned  Pending:   Pending further evaluation   Under consideration:   Diagnostic bilateral lumbar facet block #1 (Today) Diagnostic midline L2-3 LESI #1    Completed:   None at this time   Palliative options:   None established    Recent Visits Date Type Provider Dept  11/04/20 Office Visit Milinda Pointer, MD [facility]  Showing recent visits within past 90 days and meeting all other requirements Today's Visits Date Type Provider Dept  11/24/20 Procedure visit Milinda Pointer, MD [facility]  Showing today's visits and meeting all other requirements Future Appointments Date Type Provider Dept  12/08/20 Appointment Milinda Pointer, MD [facility]  Showing future appointments within next 90 days and meeting all other requirements  Disposition: Discharge home  Discharge (Date  Time): 11/24/2020; 1436 hrs.   Primary Care Physician: [name], MD Location: [facility] Note by: Gaspar Cola, MD Date: 11/24/2020;  Time: 4:53 PM  Disclaimer:  Medicine is not an Chief Strategy Officer. The only guarantee in medicine is that nothing is guaranteed. It is important to note that the decision to proceed with this intervention was based on the information collected from the patient. The Data and conclusions were drawn from the patient's questionnaire, the interview, and the physical examination. Because the information was provided in large part by the patient, it cannot be guaranteed that it has not been purposely or unconsciously manipulated. Every effort has been made to obtain as much relevant data as possible for this evaluation. It is important to note that the conclusions that lead to this procedure are derived in large part from the available data. Always take into account that the treatment will also be dependent on availability of resources and existing treatment guidelines, considered by other Pain Management Practitioners as being common knowledge and practice, at the time of the intervention. For Medico-Legal purposes, it is also important to point out that variation in procedural techniques and pharmacological choices are the acceptable norm. The indications, contraindications, technique, and results of the above procedure should only be interpreted and judged by a Board-Certified Interventional Pain Specialist with extensive familiarity and expertise in the same exact procedure and technique.

## 2020-11-24 NOTE — Telephone Encounter (Signed)
Xolair Prefilled Syringe Received:  150mg  Prefilled Syringe >> quantity #4, lot # , exp date 06/27/2021 75mg  Prefilled Syringe >> quantity #n/a Medication arrival date: 11/24/20 Received by: Temprence Rhines,LPN

## 2020-11-24 NOTE — Patient Instructions (Signed)

## 2020-11-24 NOTE — Progress Notes (Signed)
Safety precautions to be maintained throughout the outpatient stay will include: orient to surroundings, keep bed in low position, maintain call bell within reach at all times, provide assistance with transfer out of bed and ambulation.  

## 2020-11-25 ENCOUNTER — Ambulatory Visit: Payer: Self-pay | Admitting: Physician Assistant

## 2020-11-25 ENCOUNTER — Telehealth: Payer: Self-pay | Admitting: *Deleted

## 2020-11-25 NOTE — Telephone Encounter (Signed)
Called for post procedure check. Patient was asleep but his wife stated he had a good night without any issues.

## 2020-11-26 ENCOUNTER — Encounter: Payer: Self-pay | Admitting: Physician Assistant

## 2020-11-26 ENCOUNTER — Ambulatory Visit (INDEPENDENT_AMBULATORY_CARE_PROVIDER_SITE_OTHER): Payer: Medicare PPO | Admitting: Physician Assistant

## 2020-11-26 ENCOUNTER — Other Ambulatory Visit: Payer: Self-pay

## 2020-11-26 DIAGNOSIS — N319 Neuromuscular dysfunction of bladder, unspecified: Secondary | ICD-10-CM

## 2020-11-26 NOTE — Progress Notes (Signed)
Cath Change/ Replacement  Patient is present today for a catheter change due to urinary retention.  13ml of water was removed from the balloon, a 16FR foley cath was removed without difficulty.  Patient was cleaned and prepped in a sterile fashion with betadine and 2% lidocaine jelly was instilled into the urethra. A 16 FR foley cath was replaced into the bladder no complications were noted Urine return was noted <62ml and urine was yellow in color. The balloon was filled with 44ml of sterile water. Catheter was attached to the patient's leg bag in place. He tolerated well.  Performed by: Carman Ching, PA-C   Follow up: Return in about 4 weeks (around 12/24/2020) for Catheter exchange.

## 2020-12-02 ENCOUNTER — Telehealth: Payer: Self-pay | Admitting: Internal Medicine

## 2020-12-02 ENCOUNTER — Ambulatory Visit: Payer: Medicare PPO

## 2020-12-02 NOTE — Telephone Encounter (Signed)
Called and spoke with Patient's Daughter Juliette Alcide.  Patient Xolair rescheduled for 12/11/20. Nothing further at this time.

## 2020-12-03 DIAGNOSIS — M0579 Rheumatoid arthritis with rheumatoid factor of multiple sites without organ or systems involvement: Secondary | ICD-10-CM | POA: Diagnosis not present

## 2020-12-03 DIAGNOSIS — Z79899 Other long term (current) drug therapy: Secondary | ICD-10-CM | POA: Diagnosis not present

## 2020-12-04 DIAGNOSIS — M810 Age-related osteoporosis without current pathological fracture: Secondary | ICD-10-CM | POA: Diagnosis not present

## 2020-12-04 DIAGNOSIS — C61 Malignant neoplasm of prostate: Secondary | ICD-10-CM | POA: Diagnosis not present

## 2020-12-04 DIAGNOSIS — N319 Neuromuscular dysfunction of bladder, unspecified: Secondary | ICD-10-CM | POA: Diagnosis not present

## 2020-12-04 DIAGNOSIS — J449 Chronic obstructive pulmonary disease, unspecified: Secondary | ICD-10-CM | POA: Diagnosis not present

## 2020-12-04 DIAGNOSIS — Z466 Encounter for fitting and adjustment of urinary device: Secondary | ICD-10-CM | POA: Diagnosis not present

## 2020-12-04 DIAGNOSIS — I10 Essential (primary) hypertension: Secondary | ICD-10-CM | POA: Diagnosis not present

## 2020-12-04 DIAGNOSIS — M62830 Muscle spasm of back: Secondary | ICD-10-CM | POA: Diagnosis not present

## 2020-12-04 DIAGNOSIS — M5116 Intervertebral disc disorders with radiculopathy, lumbar region: Secondary | ICD-10-CM | POA: Diagnosis not present

## 2020-12-04 DIAGNOSIS — I4891 Unspecified atrial fibrillation: Secondary | ICD-10-CM | POA: Diagnosis not present

## 2020-12-07 ENCOUNTER — Encounter: Payer: Self-pay | Admitting: Pain Medicine

## 2020-12-08 ENCOUNTER — Other Ambulatory Visit: Payer: Self-pay

## 2020-12-08 ENCOUNTER — Ambulatory Visit: Payer: Medicare PPO | Attending: Pain Medicine | Admitting: Pain Medicine

## 2020-12-08 DIAGNOSIS — M545 Low back pain, unspecified: Secondary | ICD-10-CM

## 2020-12-08 DIAGNOSIS — M47816 Spondylosis without myelopathy or radiculopathy, lumbar region: Secondary | ICD-10-CM

## 2020-12-08 DIAGNOSIS — Z09 Encounter for follow-up examination after completed treatment for conditions other than malignant neoplasm: Secondary | ICD-10-CM | POA: Diagnosis not present

## 2020-12-08 DIAGNOSIS — G8929 Other chronic pain: Secondary | ICD-10-CM | POA: Diagnosis not present

## 2020-12-08 DIAGNOSIS — G894 Chronic pain syndrome: Secondary | ICD-10-CM | POA: Diagnosis not present

## 2020-12-08 DIAGNOSIS — M4310 Spondylolisthesis, site unspecified: Secondary | ICD-10-CM

## 2020-12-08 NOTE — Progress Notes (Signed)
Patient: [name]  Service Category: E/M  Provider: Gaspar Cola, MD  DOB: [birth year]  DOS: 12/08/2020  Location: Office  MRN: [id number]  Setting: Ambulatory outpatient  Referring Provider: [name], MD  Type: Established Patient  Specialty: Interventional Pain Management  PCP: [name], MD  Location: Remote location  Delivery: TeleHealth     Virtual Encounter - Pain Management PROVIDER NOTE: Information contained herein reflects review and annotations entered in association with encounter. Interpretation of such information and data should be left to medically-trained personnel. Information provided to patient can be located elsewhere in the medical record under "Patient Instructions". Document created using STT-dictation technology, any transcriptional errors that may result from process are unintentional.    Contact & Pharmacy Preferred: [phone] Home: [phone] (home) Mobile: [phone] (mobile) E-mail: [email or web address]  [id number]ina [facility]on, [place] [facility] [place] [address] [place] [address] Phone: [phone] Fax: [phone]   Pre-screening  [name] offered "in-person" vs "virtual" encounter. He indicated preferring virtual for this encounter.   Reason COVID-19*  Social distancing based on CDC and AMA recommendations.   I contacted [name] on 12/08/2020 via telephone.      I clearly identified myself as Gaspar Cola, MD. I verified that I was speaking with the correct person using two identifiers (Name: [name], and date of birth: 1934-01-21).  Consent I sought verbal advanced consent from [name] for virtual visit interactions. I informed [name] of possible security and privacy concerns, risks, and limitations associated with providing "not-in-person" medical evaluation and management services. I also informed [name] of the availability of  "in-person" appointments. Finally, I informed him that there would be a charge for the virtual visit and that he could be  personally, fully or partially, financially responsible for it. [name] expressed understanding and agreed to proceed.   Historic Elements   [name] is a 87 y.o. year old, male patient evaluated today after our last contact on 11/24/2020. [name]  has a past medical history of (HFpEF) heart failure with preserved ejection fraction ([place]), Allergy, Anemia, Anxiety, Arthritis, Asthma, Basal cell carcinoma, CAD (coronary artery disease), CHF (congestive heart failure) ([place]), Chronic kidney disease, Chronic sinusitis, Collagen vascular disease ([place]), Depression, Diverticulitis, Dysrhythmia, ED (erectile dysfunction), GERD (gastroesophageal reflux disease), History of SIADH, adenomatous colonic polyps, Hypertension, Hyponatremia, IBS (irritable bowel syndrome), Neurodermatitis, Neurogenic bladder, OSA (obstructive sleep apnea), Persistent atrial fibrillation ([place]), Prostate cancer ([place]), and Renal mass. He also  has a past surgical history that includes Prostate surgery; Nasal sinus surgery (2009); Urethra dilation (04-2693); Total knee arthroplasty (Left, 9/15); Knee Arthroplasty (Right, 09/02/2015); permanent indwelling catheter; Cardioversion (N/A, 04/23/2019); and Laparoscopic nephrectomy, hand assisted (Right, 06/17/2019). [name] has a current medication list which includes the following prescription(s): acetaminophen, alendronate, aloe, alprazolam, ascorbic acid, calcium-magnesium-zinc, chlorhexidine, vitamin d3, co q 10, cranberry, eliquis, epinephrine, fluticasone, folic acid, furosemide, gabapentin, hydroxychloroquine, ketoconazole, methotrexate, milk thistle, multivitamin, multiple vitamins-minerals, myrbetriq, fish oil, omeprazole, choline, polyethylene glycol powder, pravastatin, align, selenium, sodium chloride, triamcinolone ointment, turmeric, vitamin b-12, zinc, and  metoprolol tartrate, and the following Facility-Administered Medications: omalizumab and omalizumab. He  reports that he has never smoked. He has never used smokeless tobacco. He reports current alcohol use. He reports that he does not use drugs. [name] is allergic to ciprofloxacin, citalopram hydrobromide, clindamycin, lorazepam, paroxetine, ramipril, simvastatin, and sulfa antibiotics.  HPI  Today, he is being contacted for a post-procedure assessment.  Today I spent a lot of time with this patient on the phone trying to figure out the interpretations of the results of this first bilateral lumbar facet block.  I went through the usual explanation on how the diagnostic injections work and how they have 2 different medicines and they are what each one of them does in particular.  I given the opportunity to retract his report and I even mention the possibility that he could have gone to sleep after the procedure at home, not recording correctly the results.  However he still by his report.  The problem with this is that it does not make much sense from the pharmacological and physiological standpoint.  I used a long-acting local anesthetic (ropivacaine), which should have given him anywhere from 4 to 6 hours of numbness and therefore pain relief, however he insists that the relief only lasted 2 to 3 hours and not even at 100%.  He indicates that they 100% relief he had it only for the first hour suggesting that it was secondary to the medications given IV.  The fact that he did not get any long-term benefit would also suggest that were not dealing with any inflammatory processes, but more likely a possible mechanical compression.  All of this information was shared with the patient.  Sadly, after the procedure I had requested for the patient to return for a face-to-face visit to evaluate the results.  However, it would seem that the patient requested a virtual visit.  This was recorded by the front staff as he  made that request.  However, the patient does not seem to recall that he asked for this since today he indicated that he would have come in for a face-to-face visit should he have been asked to do so.  In any case, the point is that we will probably need to have him come in to review the physical exam, imaging studies, and see if we can figure out where the etiology of this pain resides.  This was again communicated to the patient who understood and accepted.  Post-Procedure Evaluation  Procedure (11/24/2020): Diagnostic bilateral lumbar facet MBB #1 under fluoroscopic guidance and IV sedation Pre-procedure pain level: 4/10 Post-procedure: 0/10 (100% relief)  Sedation: Sedation provided.  Effectiveness during initial hour after procedure(Ultra-Short Term Relief): 100 %.  Local anesthetic used: Long-acting (4-6 hours) Effectiveness: Defined as any analgesic benefit obtained secondary to the administration of local anesthetics. This carries significant diagnostic value as to the etiological location, or anatomical origin, of the pain. Duration of benefit is expected to coincide with the duration of the local anesthetic used.  Effectiveness during initial 4-6 hours after procedure(Short-Term Relief): 50 %.  Long-term benefit: Defined as any relief past the pharmacologic duration of the local anesthetics.  Effectiveness past the initial 6 hours after procedure(Long-Term Relief): 0 % (3 hours).  Current benefits: Defined as benefit that persist at this time.   Analgesia:  Back to baseline Function: Back to baseline ROM: Back to baseline  Pharmacotherapy Assessment  Analgesic: None MME/day: 0 mg/day   Monitoring: [place] PMP: PDMP reviewed during this encounter.       Pharmacotherapy: No side-effects or adverse reactions reported. Compliance: No problems identified. Effectiveness: Clinically acceptable. Plan: Refer to "POC".  UDS:  Summary  Date Value Ref Range Status  11/04/2020 Note  Final     Comment:    ==================================================================== Compliance Drug Analysis, Ur ====================================================================  Specimen Alert Note: Urinary creatinine is low; ability to detect some drugs may be compromised. Interpret results with caution. (Creatinine) ==================================================================== Test                             Result       Flag       Units  Drug Present and Declared for Prescription Verification   Alprazolam                     500          EXPECTED   ng/mg creat   Alpha-hydroxyalprazolam        1265         EXPECTED   ng/mg creat    Source of alprazolam is a scheduled prescription medication. Alpha-    hydroxyalprazolam is an expected metabolite of alprazolam.    Gabapentin                     PRESENT      EXPECTED   Acetaminophen                  PRESENT      EXPECTED   Metoprolol                     PRESENT      EXPECTED ==================================================================== Test                      Result    Flag   Units      Ref Range   Creatinine              17        LL     mg/dL      >=20 ==================================================================== Declared Medications:  The flagging and interpretation on this report are based on the  following declared medications.  Unexpected results may arise from  inaccuracies in the declared medications.   **Note: The testing scope of this panel includes these medications:   Alprazolam (Xanax)  Gabapentin (Neurontin)  Metoprolol (Lopressor)   **Note: The testing scope of this panel does not include small to  moderate amounts of these reported medications:   Acetaminophen (Tylenol)   **Note: The testing scope of this panel does not include the  following reported medications:   Alendronate (Fosamax)  Apixaban (Eliquis)  Bifidobacterium infantis (Align)  Calcium  Chlorhexidine (Peridex)   Choline  Cyanocobalamin  Epinephrine (EpiPen)  Fish Oil  Fluticasone (Flonase)  Folic Acid  Furosemide (Lasix)  Hydroxychloroquine (Plaquenil)  Ketoconazole (Nizoral)  Magnesium  Methotrexate  Mirabegron (Myrbetriq)  Multivitamin  Omeprazole (Prilosec)  Polyethylene Glycol (MiraLAX)  Pravastatin (Pravachol)  Selenium  Sodium Chloride  Supplement  Triamcinolone (Kenalog)  Turmeric  Ubiquinone (CoQ10)  Vitamin D3  Zinc ==================================================================== For clinical consultation, please call [phone]. ====================================================================     Laboratory Chemistry Profile   Renal Lab Results  Component Value Date   BUN 19 11/05/2020   CREATININE 0.76 11/05/2020   GFR 72.02 05/11/2020   GFRAA >60 06/22/2019   GFRNONAA >60 11/05/2020     Hepatic Lab Results  Component Value Date   AST 22 11/05/2020   ALT 17 11/05/2020   ALBUMIN 3.4 (L) 11/05/2020   ALKPHOS 51 11/05/2020   LIPASE 50 10/28/2018     Electrolytes Lab Results  Component Value Date   NA 132 (L) 11/05/2020  K 4.4 11/05/2020   CL 96 (L) 11/05/2020   CALCIUM 8.6 (L) 11/05/2020   MG 2.1 11/05/2020   PHOS 3.8 05/11/2020     Bone Lab Results  Component Value Date   25OHVITD1 45 11/05/2020   25OHVITD2 <1.0 11/05/2020   25OHVITD3 45 11/05/2020     Inflammation (CRP: Acute Phase) (ESR: Chronic Phase) Lab Results  Component Value Date   CRP 1.5 (H) 11/05/2020   ESRSEDRATE 46 (H) 11/05/2020   LATICACIDVEN 1.5 10/18/2017       Note: Above Lab results reviewed.  Imaging  DG PAIN CLINIC C-ARM 1-60 MIN NO REPORT Fluoro was used, but no Radiologist interpretation will be provided.  Please refer to "NOTES" tab for provider progress note.  Assessment  The primary encounter diagnosis was Chronic pain syndrome. Diagnoses of Chronic low back pain (Bilateral) w/o sciatica, Lumbar facet syndrome, Grade 1 Retrolisthesis at L2-3  and L3-4, and Postprocedure pain management follow-up were also pertinent to this visit.  Plan of Care  Problem-specific:  No problem-specific Assessment & Plan notes found for this encounter.  [name] has a current medication list which includes the following long-term medication(s): calcium-magnesium-zinc, eliquis, fluticasone, furosemide, gabapentin, myrbetriq, pravastatin, and metoprolol tartrate.  Pharmacotherapy (Medications Ordered): No orders of the defined types were placed in this encounter.  Orders:  No orders of the defined types were placed in this encounter.  Follow-up plan:   Return for (F2F) eval for repeat exam.      Interventional Therapies  Risk  Complexity Considerations:   Eliquis anticoagulation  Advanced age  Prior patient of Dr. Sharlet Salina    Planned  Pending:      Under consideration:   Diagnostic bilateral lumbar facet block #2  Diagnostic midline L2-3 LESI #1    Completed:   Diagnostic bilateral lumbar facet block x1 (11/24/2020)   Palliative options:   None established    Recent Visits Date Type Provider Dept  11/24/20 Procedure visit Milinda Pointer, [facility]  11/04/20 Office Visit Milinda Pointer, MD [facility]  Showing recent visits within past 90 days and meeting all other requirements Today's Visits Date Type Provider Dept  12/08/20 Telemedicine Milinda Pointer, MD [facility]  Showing today's visits and meeting all other requirements Future Appointments No visits were found meeting these conditions. Showing future appointments within next 90 days and meeting all other requirements  I discussed the assessment and treatment plan with the patient. The patient was provided an opportunity to ask questions and all were answered. The patient agreed with the plan and demonstrated an understanding of the instructions.  Patient advised to call back or seek an in-person evaluation  if the symptoms or condition worsens.  Duration of encounter: 30 minutes.  Note by: Gaspar Cola, MD Date: 12/08/2020; Time: 4:06 PM

## 2020-12-10 DIAGNOSIS — M79642 Pain in left hand: Secondary | ICD-10-CM | POA: Diagnosis not present

## 2020-12-10 DIAGNOSIS — M79641 Pain in right hand: Secondary | ICD-10-CM | POA: Diagnosis not present

## 2020-12-10 DIAGNOSIS — Z79899 Other long term (current) drug therapy: Secondary | ICD-10-CM | POA: Diagnosis not present

## 2020-12-10 DIAGNOSIS — M0579 Rheumatoid arthritis with rheumatoid factor of multiple sites without organ or systems involvement: Secondary | ICD-10-CM | POA: Diagnosis not present

## 2020-12-10 DIAGNOSIS — G8929 Other chronic pain: Secondary | ICD-10-CM | POA: Diagnosis not present

## 2020-12-10 DIAGNOSIS — M545 Low back pain, unspecified: Secondary | ICD-10-CM | POA: Diagnosis not present

## 2020-12-10 DIAGNOSIS — C641 Malignant neoplasm of right kidney, except renal pelvis: Secondary | ICD-10-CM | POA: Diagnosis not present

## 2020-12-10 DIAGNOSIS — M8000XA Age-related osteoporosis with current pathological fracture, unspecified site, initial encounter for fracture: Secondary | ICD-10-CM | POA: Diagnosis not present

## 2020-12-11 ENCOUNTER — Ambulatory Visit (INDEPENDENT_AMBULATORY_CARE_PROVIDER_SITE_OTHER): Payer: Medicare PPO

## 2020-12-11 ENCOUNTER — Other Ambulatory Visit: Payer: Self-pay

## 2020-12-11 ENCOUNTER — Other Ambulatory Visit: Payer: Self-pay | Admitting: Internal Medicine

## 2020-12-11 DIAGNOSIS — J4550 Severe persistent asthma, uncomplicated: Secondary | ICD-10-CM

## 2020-12-11 MED ORDER — OMALIZUMAB 75 MG/0.5ML ~~LOC~~ SOSY
75.0000 mg | PREFILLED_SYRINGE | Freq: Once | SUBCUTANEOUS | Status: AC
  Administered 2020-12-11: 75 mg via SUBCUTANEOUS

## 2020-12-11 MED ORDER — OMALIZUMAB 150 MG/ML ~~LOC~~ SOSY
300.0000 mg | PREFILLED_SYRINGE | Freq: Once | SUBCUTANEOUS | Status: AC
  Administered 2020-12-11: 300 mg via SUBCUTANEOUS

## 2020-12-11 NOTE — Progress Notes (Signed)
Have you been hospitalized within the last 10 days?  No Do you have a fever?  No Do you have a cough?  No Do you have a headache or sore throat? No Do you have your Epi Pen visible and is it within date?  Yes 

## 2020-12-12 NOTE — Telephone Encounter (Signed)
Last filled 11-02-20 #90 Last OV 05-11-20 No Future OV [facility] [address] [employer] [address]

## 2020-12-17 ENCOUNTER — Telehealth: Payer: Self-pay

## 2020-12-17 NOTE — Telephone Encounter (Signed)
Pt's daughter came into office to drop off DMV placard  paperwork for pt's doctor to complete, please give pt's daughter a call at [phone] when paperwork is complete.

## 2020-12-18 NOTE — Telephone Encounter (Signed)
Form done No charge 

## 2020-12-18 NOTE — Telephone Encounter (Signed)
Spoke to [place]. Form up front in yellow folders ready for pick-up.

## 2020-12-18 NOTE — Telephone Encounter (Signed)
Form placed in Dr Letvak's inbox on his desk 

## 2020-12-22 ENCOUNTER — Other Ambulatory Visit: Payer: Self-pay

## 2020-12-22 ENCOUNTER — Ambulatory Visit: Payer: Medicare PPO | Attending: Pain Medicine | Admitting: Pain Medicine

## 2020-12-22 ENCOUNTER — Encounter: Payer: Self-pay | Admitting: Pain Medicine

## 2020-12-22 VITALS — BP 112/58 | HR 58 | Temp 96.9°F | Resp 16 | Ht 69.0 in | Wt 201.0 lb

## 2020-12-22 DIAGNOSIS — G894 Chronic pain syndrome: Secondary | ICD-10-CM

## 2020-12-22 DIAGNOSIS — M4310 Spondylolisthesis, site unspecified: Secondary | ICD-10-CM | POA: Diagnosis not present

## 2020-12-22 DIAGNOSIS — M545 Low back pain, unspecified: Secondary | ICD-10-CM

## 2020-12-22 DIAGNOSIS — R937 Abnormal findings on diagnostic imaging of other parts of musculoskeletal system: Secondary | ICD-10-CM | POA: Diagnosis not present

## 2020-12-22 DIAGNOSIS — Z7901 Long term (current) use of anticoagulants: Secondary | ICD-10-CM | POA: Diagnosis not present

## 2020-12-22 DIAGNOSIS — G8929 Other chronic pain: Secondary | ICD-10-CM | POA: Diagnosis not present

## 2020-12-22 DIAGNOSIS — M47816 Spondylosis without myelopathy or radiculopathy, lumbar region: Secondary | ICD-10-CM

## 2020-12-22 NOTE — Progress Notes (Signed)
PROVIDER NOTE: Information contained herein reflects review and annotations entered in association with encounter. Interpretation of such information and data should be left to medically-trained personnel. Information provided to patient can be located elsewhere in the medical record under "Patient Instructions". Document created using STT-dictation technology, any transcriptional errors that may result from process are unintentional.    Patient: [name]  Service Category: E/M  Provider: Gaspar Cola, MD  DOB: 1934-05-09  DOS: 12/22/2020  Specialty: Interventional Pain Management  MRN: [id number]  Setting: Ambulatory outpatient  PCP: [name], MD  Type: Established Patient    Referring Provider: [name], MD  Location: Office  Delivery: Face-to-face     HPI  [name], a 87 y.o. year old male, is here today because of his Chronic pain syndrome [G89.4]. [name] primary complain today is Back Pain (lower) Last encounter: My last encounter with him was on 11/24/2020. Pertinent problems: [name] has NEUROPATHY; Neurogenic bladder; Chronic rheumatic arthritis ([place]); OSTEOARTHRITIS; Personal history of prostate cancer; Renal cell carcinoma ([place]); Chest pain; Edema of lower extremity; Back pain without sciatica; Rheumatoid arthritis involving multiple sites with positive rheumatoid factor ([place]); Mononeuritis; Chronic pain syndrome; Abnormal MRI, lumbar spine (09/21/2020); Chronic low back pain (Bilateral) w/o sciatica; DDD (degenerative disc disease), lumbosacral; Lumbosacral foraminal stenosis; Lumbar facet hypertrophy; Levoscoliosis of lumbosacral spine; Grade 1 Retrolisthesis at L2-3 and L3-4; Lumbar facet syndrome; Chronic shoulder pain (Bilateral); Chronic elbow pain (Bilateral); Chronic wrist pain (Bilateral); and Spondylosis without myelopathy or radiculopathy, lumbosacral region on their pertinent problem list. Pain Assessment: Severity of Chronic pain is reported  as a 5 /10. Location: Back Lower/pain stays in lower back area. Onset: More than a month ago. Quality: Constant,Tightness. Timing: Constant. Modifying factor(s): postion changing helps and Ecologist. Vitals:  height is _0  (1.753 m) and weight is 201 lb (91.2 kg). His temperature is 96.9 F (36.1 C) (abnormal). His blood pressure is 112/58 (abnormal) and his pulse is 58 (abnormal). His respiration is 16 and oxygen saturation is 98%.   Reason for encounter: post-procedure assessment.  The patient was contacted on 12/08/2020 after having had a diagnostic bilateral lumbar facet block done on 11/24/2020.  On that date, I requested for him to come back as a face-to-face evaluation since it had been his first procedure.  For some reason, he called and asked to switch her to a virtual visit and upon speaking to him on 12/08/2020, as it is the case with most patients after the first procedure, he indicated that "the procedure did not work".  Over the years I have learned that patients with unrealistic expectations will always tell you that the procedure did not work when they are expecting permanent relief of the pain from the injection, however, what we were doing was a diagnostic interventional procedure and what we are looking for was whether or not he would get complete relief of the pain for the duration of the local anesthetic.  During my telephone conversation with the patient, after I had explained the purpose of the local anesthetic and steroid, he then changed his answers and indicated that he had attained 100% relief of the pain for the first hour, followed by a decrease to a 50% for the next 4 to 6 hours and then the relief going down to 0.  Unfortunately, because it was a virtual visit I did not have the opportunity to fully evaluate him therefore I have rescheduled him to come back  for this follow-up.  87 year old male patient sent to our clinics as a "Fast-Track" for a lumbar epidural steroid  injection, by Dr. Viviana Simpler.  Apparently the patient had previously been seen at the [facility] by Dr. Sharlet Salina who apparently performed on 10/01/2020 bilateral S1 trigger point injections.  In addition the patient apparently also had some bilateral L5 trigger point injections done by Carolee Rota, FNP.  The patient was referred to [place] by her for bilateral L5-S1 transforaminal ESI x2.   The primary area of pain is described to be that of the lower back, bilaterally.  This pain goes down to the tailbone area in the buttocks area.  The patient has no radicular symptoms.  Review of the patient's last lumbar MRI reveals:  09/21/2020 lumbar MRI FINDINGS: Alignment: levoscoliosis with apex at L4. Grade 1 retrolisthesis at L2-3 and L3-4 Paraspinal and other soft tissues: Absent right kidney  Disc levels: L1-2: Left asymmetric disc bulge. L2-3: Endplate spurring with disc space narrowing.  L3-4: Mild disc bulge. Mild right and no left neural foraminal stenosis. L4-5: Small disc bulge with moderate facet hypertrophy. Moderate right and no left neural foraminal stenosis. Right foraminal stenosis is progressed. L5-S1: Small disc bulge. No spinal canal stenosis. No right, but mild left neural foraminal stenosis.  Visualized sacrum: Normal.  IMPRESSION: 1. Multilevel lumbar degenerative disc disease with levoscoliosis and grade 1 retrolisthesis at L2-3 and L3-4. 2. Progression of moderate right L4-5 neural foraminal stenosis. 3. No spinal canal stenosis.  The patient has secondary area of pain is described to be that of the shoulders, elbows, and wrists, bilaterally.  Today we spent quite a bit of time going over the diagnostic injections and how they work.  We talked about local anesthetics as well as the steroids.  I showed him the images of the procedure and we also went over some of the images of his MRI.  The patient denies any lower extremity symptoms including groin pain.   However he does have a lot of generalized deconditioning but it was clear that he was also having difficulty with hip flexion, which is usually associated with an L2 radiculopathy/radiculitis.  Going over the images of the MRI, it was clear to me that he does have some central canal stenosis at the L2-3 level that seems to be more significant than the other levels.  Because the pain that he is experiencing seems to follow the L1/L2 dermatomes and he presents with weakness on the hip flexors, we had decided to bring him in for a midline L2-3 LESI under fluoroscopic guidance to see if this can provide him with better relief and the lumbar facets.  Based on the results described by the patient from the diagnostic lumbar facet block, they are only contributing about 50% to his pain in the wrist seems to be coming from somewhere else, namely the L2-3 region.  The plan was shared with the patient and his daughter who understood and accepted.  Post-Procedure Evaluation  Procedure (11/24/2020): Diagnostic bilateral lumbar facet MBB #1 under fluoroscopic guidance and IV sedation Pre-procedure pain level: 4/10 Post-procedure: 0/10 (100% relief)  Sedation: Sedation provided.  Effectiveness during initial hour after procedure(Ultra-Short Term Relief): 100 %.  Local anesthetic used: Long-acting (4-6 hours) Effectiveness: Defined as any analgesic benefit obtained secondary to the administration of local anesthetics. This carries significant diagnostic value as to the etiological location, or anatomical origin, of the pain. Duration of benefit is expected to coincide with the duration of  the local anesthetic used.  Effectiveness during initial 4-6 hours after procedure(Short-Term Relief): 50 %.  Long-term benefit: Defined as any relief past the pharmacologic duration of the local anesthetics.  Effectiveness past the initial 6 hours after procedure(Long-Term Relief): 0 % (3 hours).  Current benefits: Defined as  benefit that persist at this time.               Analgesia:  Back to baseline Function: Back to baseline ROM: Back to baseline  Pharmacotherapy Assessment   Analgesic: None MME/day: 0 mg/day   Monitoring: [place] PMP: PDMP reviewed during this encounter.       Pharmacotherapy: No side-effects or adverse reactions reported. Compliance: No problems identified. Effectiveness: Clinically acceptable.  No notes on file  UDS:  Summary  Date Value Ref Range Status  11/04/2020 Note  Final    Comment:    ==================================================================== Compliance Drug Analysis, Ur ==================================================================== Specimen Alert Note: Urinary creatinine is low; ability to detect some drugs may be compromised. Interpret results with caution. (Creatinine) ==================================================================== Test                             Result       Flag       Units  Drug Present and Declared for Prescription Verification   Alprazolam                     500          EXPECTED   ng/mg creat   Alpha-hydroxyalprazolam        1265         EXPECTED   ng/mg creat    Source of alprazolam is a scheduled prescription medication. Alpha-    hydroxyalprazolam is an expected metabolite of alprazolam.    Gabapentin                     PRESENT      EXPECTED   Acetaminophen                  PRESENT      EXPECTED   Metoprolol                     PRESENT      EXPECTED ==================================================================== Test                      Result    Flag   Units      Ref Range   Creatinine              17        LL     mg/dL      >=20 ==================================================================== Declared Medications:  The flagging and interpretation on this report are based on the  following declared medications.  Unexpected results may arise from  inaccuracies in the declared medications.   **Note:  The testing scope of this panel includes these medications:   Alprazolam (Xanax)  Gabapentin (Neurontin)  Metoprolol (Lopressor)   **Note: The testing scope of this panel does not include small to  moderate amounts of these reported medications:   Acetaminophen (Tylenol)   **Note: The testing scope of this panel does not include the  following reported medications:   Alendronate (Fosamax)  Apixaban (Eliquis)  Bifidobacterium infantis (Align)  Calcium  Chlorhexidine (Peridex)  Choline  Cyanocobalamin  Epinephrine (EpiPen)  Fish Oil  Fluticasone (Flonase)  Folic Acid  Furosemide (Lasix)  Hydroxychloroquine (Plaquenil)  Ketoconazole (Nizoral)  Magnesium  Methotrexate  Mirabegron (Myrbetriq)  Multivitamin  Omeprazole (Prilosec)  Polyethylene Glycol (MiraLAX)  Pravastatin (Pravachol)  Selenium  Sodium Chloride  Supplement  Triamcinolone (Kenalog)  Turmeric  Ubiquinone (CoQ10)  Vitamin D3  Zinc ==================================================================== For clinical consultation, please call [phone]. ====================================================================      ROS  Constitutional: Denies any fever or chills Gastrointestinal: No reported hemesis, hematochezia, vomiting, or acute GI distress Musculoskeletal: Denies any acute onset joint swelling, redness, loss of ROM, or weakness Neurological: No reported episodes of acute onset apraxia, aphasia, dysarthria, agnosia, amnesia, paralysis, loss of coordination, or loss of consciousness  Medication Review  ALPRAZolam, Align, Aloe, Ascorbic Acid, Calcium-Magnesium-Zinc, Choline, Co Q 10, Cranberry, EPINEPHrine, Fish Oil, Multiple Vitamins-Minerals, Selenium, Turmeric, Vitamin D3, Zinc, acetaminophen, alendronate, apixaban, chlorhexidine, fluticasone, folic acid, furosemide, gabapentin, hydroxychloroquine, ketoconazole, methotrexate, metoprolol tartrate, milk thistle, mirabegron ER, multivitamin,  omeprazole, polyethylene glycol powder, pravastatin, sodium chloride, triamcinolone ointment, and vitamin B-12  History Review  Allergy: Mr. Rengel is allergic to ciprofloxacin, citalopram hydrobromide, clindamycin, lorazepam, paroxetine, ramipril, simvastatin, and sulfa antibiotics. Drug: Mr. Sanden  reports no history of drug use. Alcohol:  reports current alcohol use. Tobacco:  reports that he has never smoked. He has never used smokeless tobacco. Social: Mr. Dutter  reports that he has never smoked. He has never used smokeless tobacco. He reports current alcohol use. He reports that he does not use drugs. Medical:  has a past medical history of (HFpEF) heart failure with preserved ejection fraction ([place]), Allergy, Anemia, Anxiety, Arthritis, Asthma, Basal cell carcinoma, CAD (coronary artery disease), CHF (congestive heart failure) ([place]), Chronic kidney disease, Chronic sinusitis, Collagen vascular disease ([place]), Depression, Diverticulitis, Dysrhythmia, ED (erectile dysfunction), GERD (gastroesophageal reflux disease), History of SIADH, adenomatous colonic polyps, Hypertension, Hyponatremia, IBS (irritable bowel syndrome), Neurodermatitis, Neurogenic bladder, OSA (obstructive sleep apnea), Persistent atrial fibrillation ([place]), Prostate cancer ([place]), and Renal mass. Surgical: Mr. Casebolt  has a past surgical history that includes Prostate surgery; Nasal sinus surgery (2009); Urethra dilation (11-270); Total knee arthroplasty (Left, 9/15); Knee Arthroplasty (Right, 09/02/2015); permanent indwelling catheter; Cardioversion (N/A, 04/23/2019); and Laparoscopic nephrectomy, hand assisted (Right, 06/17/2019). Family: family history includes Heart disease (age of onset: 72) in his mother; Pneumonia (age of onset: 52) in his father; Skin cancer in his father, sister, and son.  Laboratory Chemistry Profile   Renal Lab Results  Component Value Date   BUN 19 11/05/2020   CREATININE 0.76 11/05/2020   GFR 72.02  05/11/2020   GFRAA >60 06/22/2019   GFRNONAA >60 11/05/2020     Hepatic Lab Results  Component Value Date   AST 22 11/05/2020   ALT 17 11/05/2020   ALBUMIN 3.4 (L) 11/05/2020   ALKPHOS 51 11/05/2020   LIPASE 50 10/28/2018     Electrolytes Lab Results  Component Value Date   NA 132 (L) 11/05/2020   K 4.4 11/05/2020   CL 96 (L) 11/05/2020   CALCIUM 8.6 (L) 11/05/2020   MG 2.1 11/05/2020   PHOS 3.8 05/11/2020     Bone Lab Results  Component Value Date   25OHVITD1 45 11/05/2020   25OHVITD2 <1.0 11/05/2020   25OHVITD3 45 11/05/2020     Inflammation (CRP: Acute Phase) (ESR: Chronic Phase) Lab Results  Component Value Date   CRP 1.5 (H) 11/05/2020   ESRSEDRATE 46 (H) 11/05/2020   LATICACIDVEN 1.5 10/18/2017       Note: Above Lab results reviewed.  Recent Imaging Review  DG PAIN CLINIC C-ARM 1-60 MIN NO REPORT Fluoro was used, but no Radiologist interpretation will be provided.  Please refer to "NOTES" tab for provider progress note. Note: Reviewed                  Physical Exam  General appearance: Well nourished, well developed, and well hydrated. In no apparent acute distress Mental status: Alert, oriented x 3 (person, place, & time)       Respiratory: No evidence of acute respiratory distress Eyes: PERLA Vitals: BP (!) 112/58 (BP Location: Left Arm, Patient Position: Sitting, Cuff Size: Large)   Pulse (!) 58   Temp (!) 96.9 F (36.1 C)   Resp 16   Ht _0  (1.753 m)   Wt 201 lb (91.2 kg)   SpO2 98%   BMI 29.68 kg/m  BMI: Estimated body mass index is 29.68 kg/m as calculated from the following:   Height as of this encounter: _1  (1.753 m).   Weight as of this encounter: 201 lb (91.2 kg). Ideal: Ideal body weight: 70.7 kg (155 lb 13.8 oz) Adjusted ideal body weight: 78.9 kg (173 lb 14.7 oz)  Assessment   Status Diagnosis  Controlled Controlled Controlled 1. Chronic pain syndrome   2. Chronic low back pain (Bilateral) w/o sciatica   3.  Lumbar facet syndrome   4. Grade 1 Retrolisthesis at L2-3 and L3-4   5. Chronic anticoagulation (Eliquis)   6. Abnormal MRI, lumbar spine (09/21/2020)      Updated Problems: No problems updated.  Plan of Care  Problem-specific:  No problem-specific Assessment & Plan notes found for this encounter.  Mr. DONIE LEMELIN has a current medication list which includes the following long-term medication(s): calcium-magnesium-zinc, eliquis, fluticasone, furosemide, gabapentin, metoprolol tartrate, myrbetriq, and pravastatin.  Pharmacotherapy (Medications Ordered): No orders of the defined types were placed in this encounter.  Orders:  Orders Placed This Encounter  Procedures  . Lumbar Epidural Injection    Standing Status:   Future    Standing Expiration Date:   01/22/2021    Scheduling Instructions:     Procedure: Interlaminar Lumbar Epidural Steroid injection (LESI)  L2-3     Laterality: Midline     Sedation: Patient's choice.     Timeframe: ASAA    Order Specific Question:   Where will this procedure be performed?    Answer:   [facility]  . Blood Thinner Instructions to Nursing    Always make sure patient has clearance from prescribing physician to stop blood thinners for interventional therapies. If the patient requires a Lovenox-bridge therapy, make sure arrangements are made to institute it with the assistance of the PCP.    Scheduling Instructions:     Have Mr. Prosperi stop the Eliquis (Apixaban) x 3 days prior to procedure or surgery.   Follow-up plan:   Return for Procedure (no sedation): (ML) L2-3 LESI #1, (Blood Thinner Protocol).      Interventional Therapies  Risk  Complexity Considerations:   Eliquis anticoagulation (Stop: 3 days  Restart: 6 hours)  Plaquenil anticoagulation (Stop: 11 days) Advanced age  Prior patient of Dr. Sharlet Salina    Planned  Pending:   Diagnostic midline L2-3 LESI #1    Under consideration:   Diagnostic bilateral lumbar facet  block #2  Diagnostic midline L2-3 LESI #1  Diagnostic interspinous process ligament injections (R/o kissing spine syndrome)   Completed:   Diagnostic bilateral lumbar facet block x1 (11/24/2020) (50% for the duration of  the local anesthetic)   Palliative options:   None established    Recent Visits Date Type Provider Dept  12/08/20 Telemedicine Milinda Pointer, MD [facility]  11/24/20 Procedure visit Milinda Pointer, MD [facility]  11/04/20 Office Visit Milinda Pointer, MD [facility]  Showing recent visits within past 90 days and meeting all other requirements Today's Visits Date Type Provider Dept  12/22/20 Office Visit Milinda Pointer, MD [facility]  Showing today's visits and meeting all other requirements Future Appointments No visits were found meeting these conditions. Showing future appointments within next 90 days and meeting all other requirements  I discussed the assessment and treatment plan with the patient. The patient was provided an opportunity to ask questions and all were answered. The patient agreed with the plan and demonstrated an understanding of the instructions.  Patient advised to call back or seek an in-person evaluation if the symptoms or condition worsens.  Duration of encounter: 45 minutes.  Note by: Gaspar Cola, MD Date: 12/22/2020; Time: 3:38 PM

## 2020-12-22 NOTE — Patient Instructions (Addendum)
____STOP [name] FOR 3 DAYS PRIOR TO PROCEDURE STOP PLAQUINIL FOR 11 DAYS PRIOR TO PROCEDURE dO NOT EAT OR DRINK FOR 8 HOURS PRIOR TO PROCEDURE_______________________________________________  Preparing for your procedure (without sedation)  Procedure appointments are limited to planned procedures: . No Prescription Refills. . No disability issues will be discussed. . No medication changes will be discussed.  Instructions: . Oral Intake: Do not eat or drink anything for at least 6 hours prior to your procedure. (Exception: Blood Pressure Medication. See below.) . Transportation: Unless otherwise stated by your physician, you may drive yourself after the procedure. . Blood Pressure Medicine: Do not forget to take your blood pressure medicine with a sip of water the morning of the procedure. If your Diastolic (lower reading)is above 100 mmHg, elective cases will be cancelled/rescheduled. . Blood thinners: These will need to be stopped for procedures. Notify our staff if you are taking any blood thinners. Depending on which one you take, there will be specific instructions on how and when to stop it. . Diabetics on insulin: Notify the staff so that you can be scheduled 1st case in the morning. If your diabetes requires high dose insulin, take only  of your normal insulin dose the morning of the procedure and notify the staff that you have done so. . Preventing infections: Shower with an antibacterial soap the morning of your procedure.  . Build-up your immune system: Take 1000 mg of Vitamin C with every meal (3 times a day) the day prior to your procedure. Marland Kitchen Antibiotics: Inform the staff if you have a condition or reason that requires you to take antibiotics before dental procedures. . Pregnancy: If you are pregnant, call and cancel the procedure. . Sickness: If you have a cold, fever, or any active infections, call and cancel the procedure. . Arrival: You must be in the facility at least 30  minutes prior to your scheduled procedure. . Children: Do not bring any children with you. . Dress appropriately: Bring dark clothing that you would not mind if they get stained. . Valuables: Do not bring any jewelry or valuables.  Reasons to call and reschedule or cancel your procedure: (Following these recommendations will minimize the risk of a serious complication.) . Surgeries: Avoid having procedures within 2 weeks of any surgery. (Avoid for 2 weeks before or after any surgery). . Flu Shots: Avoid having procedures within 2 weeks of a flu shots or . (Avoid for 2 weeks before or after immunizations). . Barium: Avoid having a procedure within 7-10 days after having had a radiological study involving the use of radiological contrast. (Myelograms, Barium swallow or enema study). . Heart attacks: Avoid any elective procedures or surgeries for the initial 6 months after a "Myocardial Infarction" (Heart Attack). . Blood thinners: It is imperative that you stop these medications before procedures. Let us know if you if you take any blood thinner.  . Infection: Avoid procedures during or within two weeks of an infection (including chest colds or gastrointestinal problems). Symptoms associated with infections include: Localized redness, fever, chills, night sweats or profuse sweating, burning sensation when voiding, cough, congestion, stuffiness, runny nose, sore throat, diarrhea, nausea, vomiting, cold or Flu symptoms, recent or current infections. It is specially important if the infection is over the area that we intend to treat. Marland Kitchen Heart and lung problems: Symptoms that may suggest an active cardiopulmonary problem include: cough, chest pain, breathing difficulties or shortness of breath, dizziness, ankle swelling, uncontrolled high or unusually low blood pressure,  and/or palpitations. If you are experiencing any of these symptoms, cancel your procedure and contact your primary care physician for an  evaluation.  Remember:  Regular Business hours are:  Monday to Thursday 8:00 AM to 4:00 PM  Provider's Schedule: Milinda Pointer, MD:  Procedure days: Tuesday and Thursday 7:30 AM to 4:00 PM  Gillis Santa, MD:  Procedure days: Monday and Wednesday 7:30 AM to 4:00 PM ____________________________________________________________________________________________    ____________________________________________________________________________________________  Blood Thinners  IMPORTANT NOTICE:  If you take any of these, make sure to notify the nursing staff.  Failure to do so may result in injury.  Recommended time intervals to stop and restart blood-thinners, before & after invasive procedures  Generic Name Brand Name Stop Time. Must be stopped at least this long before procedures. After procedures, wait at least this long before re-starting.  Abciximab Reopro 15 days 2 hrs  Alteplase Activase 10 days 10 days  Anagrelide Agrylin    Apixaban Eliquis 3 days 6 hrs  Cilostazol Pletal 3 days 5 hrs  Clopidogrel Plavix 7-10 days 2 hrs  Dabigatran Pradaxa 5 days 6 hrs  Dalteparin Fragmin 24 hours 4 hrs  Dipyridamole Aggrenox 11days 2 hrs  Edoxaban Lixiana; Savaysa 3 days 2 hrs  Enoxaparin  Lovenox 24 hours 4 hrs  Eptifibatide Integrillin 8 hours 2 hrs  Fondaparinux  Arixtra 72 hours 12 hrs  Prasugrel Effient 7-10 days 6 hrs  Reteplase Retavase 10 days 10 days  Rivaroxaban Xarelto 3 days 6 hrs  Ticagrelor Brilinta 5-7 days 6 hrs  Ticlopidine Ticlid 10-14 days 2 hrs  Tinzaparin Innohep 24 hours 4 hrs  Tirofiban Aggrastat 8 hours 2 hrs  Warfarin Coumadin 5 days 2 hrs   Other medications with blood-thinning effects  Product indications Generic (Brand) names Note  Cholesterol Lipitor Stop 4 days before procedure  Blood thinner (injectable) Heparin (LMW or LMWH Heparin) Stop 24 hours before procedure  Cancer Ibrutinib (Imbruvica) Stop 7 days before procedure  Malaria/Rheumatoid  Hydroxychloroquine (Plaquenil) Stop 11 days before procedure  Thrombolytics  10 days before or after procedures   Over-the-counter (OTC) Products with blood-thinning effects  Product Common names Stop Time  Aspirin > 325 mg Goody Powders, Excedrin, etc. 11 days  Aspirin ? 81 mg  7 days  Fish oil  4 days  Garlic supplements  7 days  Ginkgo biloba  36 hours  Ginseng  24 hours  NSAIDs Ibuprofen, Naprosyn, etc. 3 days  Vitamin E  4 days   ____________________________________________________________________________________________  Preparing for Procedure with Sedation Instructions: . Oral Intake: Do not eat or drink anything for at least 8 hours prior to your procedure. . Transportation: Public transportation is not allowed. Bring an adult driver. The driver must be physically present in our waiting room before any procedure can be started. Marland Kitchen Physical Assistance: Bring an adult capable of physically assisting you, in the event you need help. . Blood Pressure Medicine: Take your blood pressure medicine with a sip of water the morning of the procedure. . Insulin: Take only  of your normal insulin dose. . Preventing infections: Shower with an antibacterial soap the morning of your procedure. . Build-up your immune system: Take 1000 mg of Vitamin C with every meal (3 times a day) the day prior to your procedure. . Pregnancy: If you are pregnant, call and cancel the procedure. . Sickness: If you have a cold, fever, or any active infections, call and cancel the procedure. . Arrival: You must be in the facility at least  30 minutes prior to your scheduled procedure. . Children: Do not bring children with you. . Dress appropriately: Bring dark clothing that you would not mind if they get stained. . Valuables: Do not bring any jewelry or valuables. Procedure appointments are reserved for interventional treatments only. Marland Kitchen No Prescription Refills. . No medication changes will be discussed during  procedure appointments. . No disability issues will be discussed. Marland Kitchen

## 2020-12-24 ENCOUNTER — Other Ambulatory Visit: Payer: Self-pay

## 2020-12-24 ENCOUNTER — Ambulatory Visit (INDEPENDENT_AMBULATORY_CARE_PROVIDER_SITE_OTHER): Payer: Medicare PPO | Admitting: Physician Assistant

## 2020-12-24 DIAGNOSIS — N319 Neuromuscular dysfunction of bladder, unspecified: Secondary | ICD-10-CM

## 2020-12-24 NOTE — Progress Notes (Signed)
Cath Change/ Replacement  Patient is present today for a catheter change due to urinary retention.  68ml of water was removed from the balloon, a 16FR foley cath was removed without difficulty.  Patient was cleaned and prepped in a sterile fashion with betadine and 2% lidocaine jelly was instilled into the urethra. A 16 FR foley cath was replaced into the bladder no complications were noted Urine return was noted 16ml and urine was yellow in color. The balloon was filled with 15ml of sterile water and attached to the patient's leg bag in place.  Performed by: Debroah Loop, PA-C and Bradly Bienenstock, CMA  Follow up: Return in about 4 weeks (around 01/21/2021) for Catheter exchange.

## 2020-12-25 ENCOUNTER — Ambulatory Visit (INDEPENDENT_AMBULATORY_CARE_PROVIDER_SITE_OTHER): Payer: Medicare PPO

## 2020-12-25 DIAGNOSIS — J4550 Severe persistent asthma, uncomplicated: Secondary | ICD-10-CM

## 2020-12-25 MED ORDER — OMALIZUMAB 75 MG/0.5ML ~~LOC~~ SOSY
75.0000 mg | PREFILLED_SYRINGE | Freq: Once | SUBCUTANEOUS | Status: AC
  Administered 2020-12-25: 75 mg via SUBCUTANEOUS

## 2020-12-25 MED ORDER — OMALIZUMAB 150 MG/ML ~~LOC~~ SOSY
300.0000 mg | PREFILLED_SYRINGE | Freq: Once | SUBCUTANEOUS | Status: AC
  Administered 2020-12-25: 300 mg via SUBCUTANEOUS

## 2020-12-25 NOTE — Progress Notes (Signed)
Have you been hospitalized within the last 10 days?  No Do you have a fever?  No Do you have a cough?  No Do you have a headache or sore throat? No Do you have your Epi Pen visible and is it within date?  Yes 

## 2020-12-26 ENCOUNTER — Other Ambulatory Visit: Payer: Self-pay | Admitting: Cardiovascular Disease

## 2020-12-28 ENCOUNTER — Encounter: Payer: Self-pay | Admitting: Occupational Therapy

## 2020-12-28 ENCOUNTER — Ambulatory Visit: Payer: Medicare PPO | Attending: Rheumatology | Admitting: Occupational Therapy

## 2020-12-28 ENCOUNTER — Other Ambulatory Visit: Payer: Self-pay

## 2020-12-28 DIAGNOSIS — M25642 Stiffness of left hand, not elsewhere classified: Secondary | ICD-10-CM | POA: Diagnosis not present

## 2020-12-28 DIAGNOSIS — R600 Localized edema: Secondary | ICD-10-CM | POA: Diagnosis not present

## 2020-12-28 DIAGNOSIS — M6281 Muscle weakness (generalized): Secondary | ICD-10-CM | POA: Diagnosis not present

## 2020-12-28 NOTE — Telephone Encounter (Signed)
Rx request sent to pharmacy.  

## 2020-12-28 NOTE — Therapy (Signed)
[facility] [address] [place], [address] Phone: [phone]   Fax:  [phone]  Occupational Therapy Evaluation  Patient Details  Name: [name] MRN: [id number] Date of Birth: 01/04/1934 Referring Provider (OT): Dr Jefm Bryant   Encounter Date: 12/28/2020   OT End of Session - 12/28/20 1731    Visit Number 1    Number of Visits 4    Date for OT Re-Evaluation 02/08/21    OT Start Time 1517    OT Stop Time 1620    OT Time Calculation (min) 63 min    Activity Tolerance Patient tolerated treatment well    Behavior During Therapy [facility] for tasks assessed/performed           Past Medical History:  Diagnosis Date  . (HFpEF) heart failure with preserved ejection fraction ([place])    a. 02/2019 Echo: Ef 60-65%, mildly reduced RV fxn. RVSP 11mmHg. Mildly dil LA. Mod dil RA. Mild to mod TR. Mild to mod AS. Triv AI.  Marland Kitchen Allergy   . Anemia   . Anxiety   . Arthritis    rheumatoid  . Asthma   . Basal cell carcinoma    back  . CAD (coronary artery disease)   . CHF (congestive heart failure) ([place])   . Chronic kidney disease    Mass right kidney  . Chronic sinusitis   . Collagen vascular disease (HCC)    Rheumatoid Arthritis  . Depression   . Diverticulitis   . Dysrhythmia   . ED (erectile dysfunction)   . GERD (gastroesophageal reflux disease)   . History of SIADH   . Hx of adenomatous colonic polyps   . Hypertension   . Hyponatremia   . IBS (irritable bowel syndrome)   . Neurodermatitis   . Neurogenic bladder   . OSA (obstructive sleep apnea)    no CPAP since weight loss  . Persistent atrial fibrillation ([place])    a. Dx 02/2019; b. CHA2DS2VASc = 6-->eliquis initiated.  . Prostate cancer ([place])   . Renal mass    10/2018 4.2cm R renal cortical mass. Most compatible w/ clear cell renal cell carcinoma.    Past Surgical History:  Procedure Laterality Date  . CARDIOVERSION N/A 04/23/2019   Procedure:  CARDIOVERSION;  Surgeon: Nelva Bush, MD;  Location: [facility] ORS;  Service: Cardiovascular;  Laterality: N/A;  . KNEE ARTHROPLASTY Right 09/02/2015   Procedure: COMPUTER ASSISTED TOTAL KNEE ARTHROPLASTY;  Surgeon: [name], MD;  Location: [facility] ORS;  Service: Orthopedics;  Laterality: Right;  . LAPAROSCOPIC NEPHRECTOMY, HAND ASSISTED Right 06/17/2019   Procedure: HAND ASSISTED LAPAROSCOPIC NEPHRECTOMY;  Surgeon: Hollice Espy, MD;  Location: [facility] ORS;  Service: Urology;  Laterality: Right;  . NASAL SINUS SURGERY  2009   DEVIATED SEPTUM AND POLYPS  . permanent indwelling catheter    . PROSTATE SURGERY     PROSTATECTOMY  . TOTAL KNEE ARTHROPLASTY Left 9/15   Dr [name]  . URETHRAL STRICTURE DILATATION  02-2010   Dr.Cope    There were no vitals filed for this visit.   Subjective Assessment - 12/28/20 1718    Subjective  About 2-3 months I just look one day and my pinkie was hanging down and I cannot straighten it -  I can make fist    Pertinent History History of RA in hands , wrist , shoulder - multiple joints in body - seen Rheumathologist 12/10/20 - showed subluxation of 5th [facility] extensor - refer  to OT/hand therapy    Patient Stated Goals Want to see if I can get my pinkie straight again -that it do not hang down to the side    Currently in Pain? Yes    Pain Score 5     Pain Location Wrist    Pain Orientation Right    Pain Descriptors / Indicators Aching;Tender    Pain Type Chronic pain    Pain Onset More than a month ago    Pain Frequency Constant             [facility] OT Assessment - 12/28/20 0001      Assessment   Medical Diagnosis RA , subluxation of L 5th MC extensor    Referring Provider (OT) Dr Jefm Bryant    Onset Date/Surgical Date 08/28/20    Hand Dominance Right    Next MD Visit --   3 months     Home  Environment   Lives With Family      Prior Function   Vocation Retired   daughter lives with him and his wife   Leisure Was in radio prior to retirement, read,  [employer] and movies , short wave radio      Right Hand AROM   R Little DIP 0-70 --   ulnar devation     Left Hand AROM   L Index  MCP 0-90 85 Degrees    L Index PIP 0-100 100 Degrees    L Long  MCP 0-90 90 Degrees    L Long PIP 0-100 100 Degrees    L Ring  MCP 0-90 90 Degrees    L Ring PIP 0-100 100 Degrees    L Little  MCP 0-90 85 Degrees   35 ulnar deviation , and into flexion   L Little PIP 0-100 90 Degrees             Pt fitted with temporary soft neoprene anti ulnar devation splint to use some during day- when using hands  But need to increase wearing time over the week - 1 to 2 to 3 hrs at time And when sitting watching tv or resting - hand need to be on flat surface and hand open and 5th digit in ADD 5 x day -10 reps ADD of digits - sliding on paper if needed for 4th and 5th  Joint protection principles - pt ed on and hand out provided - use palms to hold book ,or get stand or clip to keep it open Or on pillow - newspaper on table   Use larger joints - and avoid lat grip for prolonged times - to decrease ulnar deviation of digits  Daughter present for review of Home program and recommendations  Assess but only medium in clinic - pt needs large - Ordering pt a ulnar deviation/arthritis splint -that has hard piece in palm and foam adjusters for side  Will be better for pt to wear night time  Cannot do buddy strap - 5th with put stress on 4th - into subluxation                 OT Education - 12/28/20 1730    Education Details findings of eval and homeprogram    Person(s) Educated Patient;Child(ren)    Methods Explanation;Demonstration;Tactile cues;Verbal cues;Handout    Comprehension Verbal cues required;Returned demonstration;Verbalized understanding               OT Long Term Goals - 12/28/20 1736  OT LONG TERM GOAL #1   Title Pt and daughter demo correct donning and wearing of anti ulnar devations splint  and HEP to prevent worsening of  contracture and prevent pain    Baseline Pt with no pain in 5th MC , 35 degrees of ulnar devation and into flexion close to 90    Time 4    Period Weeks    Status New    Target Date 01/25/21      OT LONG TERM GOAL #2   Title Pt and daughter verbalize 2 modifications and joint protection or AE is using to prevent worsening of subluxation of  L 5th MC    Baseline no knowledge - holding newspaper and book - resting hand in thigh - 5th digit hanging in ABD and flexion    Time 6    Period Weeks    Status New    Target Date 02/08/21                 Plan - 12/28/20 1732    Clinical Impression Statement Pt refer for L hand pain and 5th digit subluxation - pt with history of RA - pt show 35 degrees of ulnar devation and flexoin of 5th -unable to do extention of MC on 5th and not able to do past neutral on 4th - flexion WNL - pt and daughter ed on modifications with using L non dominant hand in pt's activities and hobbies - ed on resting position for hand , and AE - pt fitted with temporary anti ulnar deviation neoprene splint -but pt need the hard piece on in palm to be able to use at night too- provided some HEP for ADD , and modifications- pt to return when correct splint arrive to fit - and assess correct use    OT Occupational Profile and History Problem Focused Assessment - Including review of records relating to presenting problem    Occupational performance deficits (Please refer to evaluation for details): ADL's;IADL's;Leisure    Body Structure / Function / Physical Skills ADL;IADL;Strength;Edema;Flexibility;ROM;UE functional use    Rehab Potential Fair    Clinical Decision Making Limited treatment options, no task modification necessary    Comorbidities Affecting Occupational Performance: May have comorbidities impacting occupational performance   RA, chronic condition   Modification or Assistance to Complete Evaluation  No modification of tasks or assist necessary to complete eval     OT Frequency Biweekly    OT Duration 6 weeks    OT Treatment/Interventions Self-care/ADL training;Manual Therapy;Passive range of motion;Splinting;Patient/family education;Therapeutic exercise    Plan FIt pt with correct splint - review and ed homeprogram           Patient will benefit from skilled therapeutic intervention in order to improve the following deficits and impairments:   Body Structure / Function / Physical Skills: ADL,IADL,Strength,Edema,Flexibility,ROM,UE functional use       Visit Diagnosis: Stiffness of left hand, not elsewhere classified  Localized edema  Muscle weakness (generalized)    Problem List Patient Active Problem List   Diagnosis Date Noted  . Postprocedure pain management follow-up 12/08/2020  . Spondylosis without myelopathy or radiculopathy, lumbosacral region 11/24/2020  . Elevated C-reactive protein (CRP) 11/24/2020  . Elevated sed rate 11/24/2020  . Chronic pain syndrome 11/04/2020  . Pharmacologic therapy 11/04/2020  . Disorder of skeletal system 11/04/2020  . Problems influencing health status 11/04/2020  . Abnormal MRI, lumbar spine (09/21/2020) 11/04/2020  . Chronic low back pain (Bilateral) w/o sciatica 11/04/2020  .  DDD (degenerative disc disease), lumbosacral 11/04/2020  . Lumbosacral foraminal stenosis 11/04/2020  . Lumbar facet hypertrophy 11/04/2020  . Levoscoliosis of lumbosacral spine 11/04/2020  . Grade 1 Retrolisthesis at L2-3 and L3-4 11/04/2020  . Chronic anticoagulation (Eliquis) 11/04/2020  . Lumbar facet syndrome 11/04/2020  . Long term prescription benzodiazepine use 11/04/2020  . Chronic shoulder pain (Bilateral) 11/04/2020  . Chronic elbow pain (Bilateral) 11/04/2020  . Chronic wrist pain (Bilateral) 11/04/2020  . Back pain without sciatica 07/18/2020  . Chronic sinusitis 07/12/2020  . Persistent atrial fibrillation ([place]) 07/12/2020  . Chest pain 04/02/2020  . Anemia in chronic kidney disease 09/12/2019  .  Edema of lower extremity 09/12/2019  . Hyposmolality and/or hyponatremia 09/12/2019  . Renal cell carcinoma ([place]) 07/11/2019  . Paroxysmal atrial fibrillation ([place]) 05/09/2019  . History of TIA (transient ischemic attack) 12/06/2018  . Vision changes 12/06/2018  . TIA (transient ischemic attack) 06/06/2018  . Carotid artery disease ([place]) 06/06/2018  . Frailty 04/24/2018  . Long term current use of immunosuppressive drug 04/24/2018  . Obesity (BMI 30-39.9) 04/24/2018  . SIADH (syndrome of inappropriate ADH production) ([place]) 04/17/2017  . AKI (acute kidney injury) ([place]) 04/03/2017  . Hyponatremia 01/16/2017  . Urinary tract infection associated with indwelling urethral catheter ([place]) 12/19/2016  . Asthma with acute exacerbation 11/14/2016  . Mild intermittent asthma without complication [id number]  . Mood disorder ([place]) 01/18/2016  . Presence of indwelling urinary catheter 08/20/2015  . Advanced directives, counseling/discussion 07/28/2014  . Routine general medical examination at a health care facility 04/09/2012  . Venous stasis dermatitis 06/13/2011  . CONSTIPATION, CHRONIC 12/07/2010  . Chronic constipation 12/07/2010  . Hyperlipidemia 12/02/2010  . OSTEOARTHRITIS 12/02/2010  . Neurogenic bladder 05/24/2010  . IBS 05/05/2010  . History of colonic polyps 05/05/2010  . NEUROPATHY 05/03/2010  . Mononeuritis 05/03/2010  . Personal history of prostate cancer 03/04/2010  . Benign essential hypertension 03/25/2009  . GAD (generalized anxiety disorder) 01/27/2007  . Coronary atherosclerosis of native coronary artery 01/27/2007  . Allergic asthma 01/27/2007  . GERD 01/27/2007  . Chronic rheumatic arthritis ([place]) 01/27/2007  . Anxiety 01/27/2007  . Rheumatoid arthritis involving multiple sites with positive rheumatoid factor ([place]) 01/27/2007    Samah Lapiana, Gwenette Greet OTR/lL,CLT1/31/2022, 5:42 PM  [facility] [address], [place], [address] Phone: [phone]   Fax:  [phone]  Name: [name] MRN: [id number] Date of Birth: 1934/08/18

## 2020-12-28 NOTE — Patient Instructions (Signed)
Pt fitted with temporary soft neoprene anti ulnar devation splint to use some during day- when using hands  But need to increase wearing time over the week - 1 to 2 to 3 hrs at time And when sitting watching tv or resting - hand need to be on flat surface and hand open and 5th digit in ADD 5 x day -10 reps ADD of digits - sliding on paper if needed for 4th and 5th  Joint protection principles - pt ed on and hand out provided - use palms to hold book ,or get stand or clip to keep it open Or on pillow - newspaper on table   Use larger joints - and avoid lat grip for prolonged times - to decrease ulnar deviation of digits  Daughter present for review of Home program and recommendations  Ordering pt a ulnar deviation/arthritis splint -that has hard piece in palm and foam adjusters for side  Will be better for pt to wear night time

## 2021-01-04 ENCOUNTER — Telehealth: Payer: Self-pay | Admitting: Pulmonary Disease

## 2021-01-04 NOTE — Telephone Encounter (Signed)
Xolair Prefilled Syringe Order: 150mg  Prefilled Syringe:  #4 75mg  Prefilled Syringe: #2 Ordered Date: 01/04/21 Expected date of arrival: 01/05/21 Ordered by: Zori Benbrook,LPN [facility]ty [facility]: [facility]

## 2021-01-05 NOTE — Telephone Encounter (Signed)
Xolair Prefilled Syringe Received:  150mg  Prefilled Syringe >> quantity #4, lot # [id number], exp date 07/28/2021 75mg  Prefilled Syringe >> quantity #2, lot # [id number], exp date 10/27/2021 Medication arrival date: 01/05/21 Received by: Mikko Lewellen,LPN

## 2021-01-07 ENCOUNTER — Other Ambulatory Visit: Payer: Self-pay | Admitting: Cardiovascular Disease

## 2021-01-08 NOTE — Telephone Encounter (Signed)
Refill request

## 2021-01-08 NOTE — Telephone Encounter (Signed)
Prescription refill request for Eliquis received.  Indication: Afib Last office visit: [place], 11/05/2020 Scr: 0.76, 11/05/2020 Age: 87 yo  Weight: 91.2 kg   Prescription refill sent.

## 2021-01-12 ENCOUNTER — Other Ambulatory Visit: Payer: Self-pay

## 2021-01-12 ENCOUNTER — Other Ambulatory Visit: Payer: Self-pay | Admitting: Urology

## 2021-01-12 ENCOUNTER — Ambulatory Visit (INDEPENDENT_AMBULATORY_CARE_PROVIDER_SITE_OTHER): Payer: Medicare PPO

## 2021-01-12 DIAGNOSIS — J4550 Severe persistent asthma, uncomplicated: Secondary | ICD-10-CM | POA: Diagnosis not present

## 2021-01-12 MED ORDER — OMALIZUMAB 75 MG/0.5ML ~~LOC~~ SOSY
75.0000 mg | PREFILLED_SYRINGE | Freq: Once | SUBCUTANEOUS | Status: AC
  Administered 2021-01-12: 75 mg via SUBCUTANEOUS

## 2021-01-12 MED ORDER — OMALIZUMAB 150 MG/ML ~~LOC~~ SOSY
300.0000 mg | PREFILLED_SYRINGE | Freq: Once | SUBCUTANEOUS | Status: AC
  Administered 2021-01-12: 300 mg via SUBCUTANEOUS

## 2021-01-12 NOTE — Progress Notes (Signed)
Have you been hospitalized within the last 10 days?  No Do you have a fever?  No Do you have a cough?  No Do you have a headache or sore throat? No Do you have your Epi Pen visible and is it within date?  Yes 

## 2021-01-14 ENCOUNTER — Ambulatory Visit (INDEPENDENT_AMBULATORY_CARE_PROVIDER_SITE_OTHER): Payer: Medicare PPO | Admitting: Physician Assistant

## 2021-01-14 ENCOUNTER — Other Ambulatory Visit: Payer: Self-pay

## 2021-01-14 DIAGNOSIS — N319 Neuromuscular dysfunction of bladder, unspecified: Secondary | ICD-10-CM | POA: Diagnosis not present

## 2021-01-14 NOTE — Progress Notes (Signed)
Cath Change/ Replacement  Patient is present today for a catheter change due to urinary retention.  5ml of water was removed from the balloon, a 16FR foley cath was removed without difficulty.  Patient was cleaned and prepped in a sterile fashion with betadine and 2% lidocaine jelly was instilled into the urethra. A 16 FR foley cath was replaced into the bladder no complications were noted Urine return was noted 76ml and urine was yellow in color. The balloon was filled with 72ml of sterile water.  Catheter was attached to the patient's leg bag.    Performed by: Debroah Loop, PA-C and Bradly Bienenstock, CMA  Follow up: Return in about 4 weeks (around 02/11/2021) for Catheter exchange.

## 2021-01-18 ENCOUNTER — Ambulatory Visit
Admission: RE | Admit: 2021-01-18 | Discharge: 2021-01-18 | Disposition: A | Discharge status: Home / Self Care | Payer: Medicare PPO | Source: Ambulatory Visit | Attending: Urology | Admitting: Urology

## 2021-01-18 ENCOUNTER — Ambulatory Visit: Admission: RE | Admit: 2021-01-18 | Payer: Medicare PPO | Source: Ambulatory Visit

## 2021-01-18 ENCOUNTER — Other Ambulatory Visit: Payer: Self-pay

## 2021-01-18 ENCOUNTER — Other Ambulatory Visit: Payer: Self-pay | Admitting: Internal Medicine

## 2021-01-18 DIAGNOSIS — J8410 Pulmonary fibrosis, unspecified: Secondary | ICD-10-CM | POA: Diagnosis not present

## 2021-01-18 DIAGNOSIS — C641 Malignant neoplasm of right kidney, except renal pelvis: Secondary | ICD-10-CM | POA: Insufficient documentation

## 2021-01-18 DIAGNOSIS — J984 Other disorders of lung: Secondary | ICD-10-CM | POA: Diagnosis not present

## 2021-01-18 DIAGNOSIS — Z85528 Personal history of other malignant neoplasm of kidney: Secondary | ICD-10-CM | POA: Diagnosis not present

## 2021-01-18 DIAGNOSIS — K5730 Diverticulosis of large intestine without perforation or abscess without bleeding: Secondary | ICD-10-CM | POA: Diagnosis not present

## 2021-01-18 DIAGNOSIS — I700 Atherosclerosis of aorta: Secondary | ICD-10-CM | POA: Diagnosis not present

## 2021-01-18 DIAGNOSIS — K449 Diaphragmatic hernia without obstruction or gangrene: Secondary | ICD-10-CM | POA: Diagnosis not present

## 2021-01-18 DIAGNOSIS — N281 Cyst of kidney, acquired: Secondary | ICD-10-CM | POA: Diagnosis not present

## 2021-01-18 LAB — POCT I-STAT CREATININE: Creatinine, Ser: 1.1 mg/dL (ref 0.61–1.24)

## 2021-01-18 MED ORDER — IOHEXOL 300 MG/ML  SOLN
100.0000 mL | Freq: Once | INTRAMUSCULAR | Status: AC | PRN
  Administered 2021-01-18: 100 mL via INTRAVENOUS

## 2021-01-20 ENCOUNTER — Telehealth (INDEPENDENT_AMBULATORY_CARE_PROVIDER_SITE_OTHER): Payer: Medicare PPO | Admitting: Urology

## 2021-01-20 ENCOUNTER — Other Ambulatory Visit: Payer: Self-pay

## 2021-01-20 ENCOUNTER — Ambulatory Visit: Payer: Medicare PPO | Admitting: Occupational Therapy

## 2021-01-20 ENCOUNTER — Other Ambulatory Visit: Payer: Self-pay | Admitting: Internal Medicine

## 2021-01-20 DIAGNOSIS — N319 Neuromuscular dysfunction of bladder, unspecified: Secondary | ICD-10-CM

## 2021-01-20 DIAGNOSIS — C641 Malignant neoplasm of right kidney, except renal pelvis: Secondary | ICD-10-CM | POA: Diagnosis not present

## 2021-01-20 NOTE — Progress Notes (Signed)
This service is provided via telemedicine   No vital signs collected/recorded due to the encounter was a telemedicine visit.     Patient consents to a telephone visit:  Yes All Medications, Pharmacy and history verified and updated.     Names of all persons participating in the telemedicine service and their role in the encounter:  Verlene Mayer, [place]

## 2021-01-21 ENCOUNTER — Ambulatory Visit: Payer: Self-pay | Admitting: Physician Assistant

## 2021-01-21 NOTE — Telephone Encounter (Signed)
Last filled 12-13-20 #90 Last OV 05-11-20 No Future OV [facility] [address] [employer]

## 2021-01-24 ENCOUNTER — Other Ambulatory Visit (HOSPITAL_COMMUNITY): Payer: Self-pay | Admitting: Pharmacy Technician

## 2021-01-24 DIAGNOSIS — J4550 Severe persistent asthma, uncomplicated: Secondary | ICD-10-CM

## 2021-01-25 ENCOUNTER — Telehealth: Payer: Self-pay | Admitting: Pharmacy Technician

## 2021-01-25 NOTE — Telephone Encounter (Signed)
Received notification from [facility] regarding a prior authorization for [name]. Authorization has been APPROVED from 01/22/21 to 11/27/21   Authorization # [id number] Phone # [phone]

## 2021-01-28 DIAGNOSIS — X32XXXA Exposure to sunlight, initial encounter: Secondary | ICD-10-CM | POA: Diagnosis not present

## 2021-01-28 DIAGNOSIS — L820 Inflamed seborrheic keratosis: Secondary | ICD-10-CM | POA: Diagnosis not present

## 2021-01-28 DIAGNOSIS — L570 Actinic keratosis: Secondary | ICD-10-CM | POA: Diagnosis not present

## 2021-01-28 DIAGNOSIS — D1801 Hemangioma of skin and subcutaneous tissue: Secondary | ICD-10-CM | POA: Diagnosis not present

## 2021-01-28 DIAGNOSIS — Z08 Encounter for follow-up examination after completed treatment for malignant neoplasm: Secondary | ICD-10-CM | POA: Diagnosis not present

## 2021-01-28 DIAGNOSIS — Z85828 Personal history of other malignant neoplasm of skin: Secondary | ICD-10-CM | POA: Diagnosis not present

## 2021-01-28 DIAGNOSIS — L538 Other specified erythematous conditions: Secondary | ICD-10-CM | POA: Diagnosis not present

## 2021-01-28 DIAGNOSIS — R58 Hemorrhage, not elsewhere classified: Secondary | ICD-10-CM | POA: Diagnosis not present

## 2021-01-28 DIAGNOSIS — L300 Nummular dermatitis: Secondary | ICD-10-CM | POA: Diagnosis not present

## 2021-01-29 ENCOUNTER — Other Ambulatory Visit: Payer: Self-pay

## 2021-01-29 ENCOUNTER — Ambulatory Visit (INDEPENDENT_AMBULATORY_CARE_PROVIDER_SITE_OTHER): Payer: Medicare PPO

## 2021-01-29 DIAGNOSIS — J4550 Severe persistent asthma, uncomplicated: Secondary | ICD-10-CM | POA: Diagnosis not present

## 2021-01-29 MED ORDER — OMALIZUMAB 75 MG/0.5ML ~~LOC~~ SOSY
75.0000 mg | PREFILLED_SYRINGE | Freq: Once | SUBCUTANEOUS | Status: AC
  Administered 2021-01-29: 75 mg via SUBCUTANEOUS

## 2021-01-29 MED ORDER — OMALIZUMAB 150 MG/ML ~~LOC~~ SOSY
300.0000 mg | PREFILLED_SYRINGE | Freq: Once | SUBCUTANEOUS | Status: AC
  Administered 2021-01-29: 300 mg via SUBCUTANEOUS

## 2021-01-29 NOTE — Progress Notes (Signed)
Have you been hospitalized within the last 10 days?  No Do you have a fever?  No Do you have a cough?  No Do you have a headache or sore throat? No Do you have your Epi Pen visible and is it within date?  Yes 

## 2021-02-01 DIAGNOSIS — R600 Localized edema: Secondary | ICD-10-CM | POA: Diagnosis not present

## 2021-02-01 DIAGNOSIS — E871 Hypo-osmolality and hyponatremia: Secondary | ICD-10-CM | POA: Diagnosis not present

## 2021-02-01 DIAGNOSIS — N182 Chronic kidney disease, stage 2 (mild): Secondary | ICD-10-CM | POA: Diagnosis not present

## 2021-02-02 DIAGNOSIS — H903 Sensorineural hearing loss, bilateral: Secondary | ICD-10-CM | POA: Diagnosis not present

## 2021-02-02 DIAGNOSIS — H6123 Impacted cerumen, bilateral: Secondary | ICD-10-CM | POA: Diagnosis not present

## 2021-02-03 ENCOUNTER — Ambulatory Visit: Payer: Medicare PPO | Attending: Rheumatology | Admitting: Occupational Therapy

## 2021-02-03 ENCOUNTER — Other Ambulatory Visit: Payer: Self-pay

## 2021-02-03 DIAGNOSIS — M25642 Stiffness of left hand, not elsewhere classified: Secondary | ICD-10-CM | POA: Diagnosis not present

## 2021-02-03 DIAGNOSIS — R600 Localized edema: Secondary | ICD-10-CM | POA: Insufficient documentation

## 2021-02-03 DIAGNOSIS — M6281 Muscle weakness (generalized): Secondary | ICD-10-CM | POA: Diagnosis not present

## 2021-02-03 NOTE — Therapy (Signed)
[place] [address] [place], [address] Phone: [phone]   Fax:  [phone]  Occupational Therapy Treatment  Patient Details  Name: [name] MRN: [id number] Date of Birth: 1934/07/27 Referring Provider (OT): Dr Jefm Bryant   Encounter Date: 02/03/2021   OT End of Session - 02/03/21 1717    Visit Number 2    Number of Visits 4    Date for OT Re-Evaluation 02/08/21    OT Start Time 2202    OT Stop Time 1545    OT Time Calculation (min) 31 min    Activity Tolerance Patient tolerated treatment well    Behavior During Therapy [facility] for tasks assessed/performed           Past Medical History:  Diagnosis Date  . (HFpEF) heart failure with preserved ejection fraction ([place])    a. 02/2019 Echo: Ef 60-65%, mildly reduced RV fxn. RVSP 24mmHg. Mildly dil LA. Mod dil RA. Mild to mod TR. Mild to mod AS. Triv AI.  Marland Kitchen Allergy   . Anemia   . Anxiety   . Arthritis    rheumatoid  . Asthma   . Basal cell carcinoma    back  . CAD (coronary artery disease)   . CHF (congestive heart failure) ([place])   . Chronic kidney disease    Mass right kidney  . Chronic sinusitis   . Collagen vascular disease (HCC)    Rheumatoid Arthritis  . Depression   . Diverticulitis   . Dysrhythmia   . ED (erectile dysfunction)   . GERD (gastroesophageal reflux disease)   . History of SIADH   . Hx of adenomatous colonic polyps   . Hypertension   . Hyponatremia   . IBS (irritable bowel syndrome)   . Neurodermatitis   . Neurogenic bladder   . OSA (obstructive sleep apnea)    no CPAP since weight loss  . Persistent atrial fibrillation ([place])    a. Dx 02/2019; b. CHA2DS2VASc = 6-->eliquis initiated.  . Prostate cancer ([place])   . Renal mass    10/2018 4.2cm R renal cortical mass. Most compatible w/ clear cell renal cell carcinoma.    Past Surgical History:  Procedure Laterality Date  . CARDIOVERSION N/A 04/23/2019   Procedure:  CARDIOVERSION;  Surgeon: Nelva Bush, MD;  Location: [facility] ORS;  Service: Cardiovascular;  Laterality: N/A;  . KNEE ARTHROPLASTY Right 09/02/2015   Procedure: COMPUTER ASSISTED TOTAL KNEE ARTHROPLASTY;  Surgeon: [name], MD;  Location: [facility] ORS;  Service: Orthopedics;  Laterality: Right;  . LAPAROSCOPIC NEPHRECTOMY, HAND ASSISTED Right 06/17/2019   Procedure: HAND ASSISTED LAPAROSCOPIC NEPHRECTOMY;  Surgeon: Hollice Espy, MD;  Location: [facility] ORS;  Service: Urology;  Laterality: Right;  . NASAL SINUS SURGERY  2009   DEVIATED SEPTUM AND POLYPS  . permanent indwelling catheter    . PROSTATE SURGERY     PROSTATECTOMY  . TOTAL KNEE ARTHROPLASTY Left 9/15   Dr [name]  . URETHRAL STRICTURE DILATATION  02-2010   Dr.Cope    There were no vitals filed for this visit.   Subjective Assessment - 02/03/21 1716    Subjective  I lost my oval 8 splint in the bed and cannot find it - and I am resting my pinkie on clip board and not letting it hang off my thigh - little better- I can hold it my pinkie now in when I slide it in    Pertinent History History of RA in hands ,  wrist , shoulder - multiple joints in body - seen Rheumathologist 12/10/20 - showed subluxation of 5th MC extensor - refer to OT/hand therapy    Patient Stated Goals Want to see if I can get my pinkie straight again -that it do not hang down to the side    Currently in Pain? No/denies                 Pt fitted with anti ulnar devation splint with hard piece in palm and foam adjusters for sides of digits - on ulnar side of 5th MC  Pt and daughter ed on donning , doffing and wearing night time  No issues - fitting very well   Pt and daughter report pt is when sitting watching tv or resting - hand is on flat surface and hand open and 5th digit in ADD 5 x day -10 reps ADD of digits - sliding on paper if needed for 4th and 5th - his doing that and can maintain ADD now when picking up hand   Joint protection principles - pt  ed on and hand out provided last time - use palms to hold book ,or get stand or clip to keep it open Or on pillow - newspaper on table - review again  Use larger joints - and avoid lat grip for prolonged times - to decrease ulnar deviation of digits  Daughter present for review splint wearing  And home program and recommendations   Cannot do buddy strap - 5th with put stress on 4th - into subluxation  Pt fitted with oval 8 - size 5 on R 5th DIP - to decrease deviation - pt to wear during day some - and daughter can order from [place] if he loose it again                    OT Education - 02/03/21 1717    Education Details splint fitting and HEP    Person(s) Educated Patient;Child(ren)    Methods Explanation;Demonstration;Tactile cues;Verbal cues;Handout    Comprehension Verbal cues required;Returned demonstration;Verbalized understanding               OT Long Term Goals - 02/03/21 1721      OT LONG TERM GOAL #1   Title Pt and daughter demo correct donning and wearing of anti ulnar devations splint  and HEP to prevent worsening of contracture and prevent pain    Baseline Pt with no pain in 5th MC , 35 degrees of ulnar devation and into flexion close to 90- can ADD 5th in sliding and maintain ADD- and fitted with splint  tto wear night time    Status Achieved      OT LONG TERM GOAL #2   Title Pt and daughter verbalize 2 modifications and joint protection or AE is using to prevent worsening of subluxation of  L 5th MC    Baseline no knowledge - holding newspaper and book - resting hand in thigh - 5th digit hanging in ABD and flexion- NOW doing HEP on clipboard ADD of 5th , resting correctly and holding book    Status Achieved                 Plan - 02/03/21 1718    Clinical Impression Statement Pt report decrease pain and cont to show L 5th digit subluxation - pt history of RA - pt cont to show 35 degrees of ulnar deviation of 5th and MC flexion - but  can do ADD on table and maintain ADD of 5th - pt doing well during day keeping hand on clip board and not let 5th finger hang down his thigh- pt was fitted this dae with anti ulnar deviation splint with hard piece on palm and foam seperators between digits and on ulnar side of 5th - pt can wear this one at night time - pt and wife demo understanding of donning , doffing and wearing of splint - and HEP -pt discharge at this time    OT Occupational Profile and History Problem Focused Assessment - Including review of records relating to presenting problem    Occupational performance deficits (Please refer to evaluation for details): ADL's;IADL's;Leisure    Body Structure / Function / Physical Skills ADL;IADL;Strength;Edema;Flexibility;ROM;UE functional use    Rehab Potential Fair    Clinical Decision Making Limited treatment options, no task modification necessary    Comorbidities Affecting Occupational Performance: May have comorbidities impacting occupational performance    Modification or Assistance to Complete Evaluation  No modification of tasks or assist necessary to complete eval    OT Treatment/Interventions Self-care/ADL training;Manual Therapy;Passive range of motion;Splinting;Patient/family education;Therapeutic exercise    Consulted and Agree with Plan of Care Patient;Family member/caregiver           Patient will benefit from skilled therapeutic intervention in order to improve the following deficits and impairments:   Body Structure / Function / Physical Skills: ADL,IADL,Strength,Edema,Flexibility,ROM,UE functional use       Visit Diagnosis: Localized edema  Muscle weakness (generalized)  Stiffness of left hand, not elsewhere classified    Problem List Patient Active Problem List   Diagnosis Date Noted  . Postprocedure pain management follow-up 12/08/2020  . Spondylosis without myelopathy or radiculopathy, lumbosacral region 11/24/2020  . Elevated C-reactive protein  (CRP) 11/24/2020  . Elevated sed rate 11/24/2020  . Chronic pain syndrome 11/04/2020  . Pharmacologic therapy 11/04/2020  . Disorder of skeletal system 11/04/2020  . Problems influencing health status 11/04/2020  . Abnormal MRI, lumbar spine (09/21/2020) 11/04/2020  . Chronic low back pain (Bilateral) w/o sciatica 11/04/2020  . DDD (degenerative disc disease), lumbosacral 11/04/2020  . Lumbosacral foraminal stenosis 11/04/2020  . Lumbar facet hypertrophy 11/04/2020  . Levoscoliosis of lumbosacral spine 11/04/2020  . Grade 1 Retrolisthesis at L2-3 and L3-4 11/04/2020  . Chronic anticoagulation (Eliquis) 11/04/2020  . Lumbar facet syndrome 11/04/2020  . Long term prescription benzodiazepine use 11/04/2020  . Chronic shoulder pain (Bilateral) 11/04/2020  . Chronic elbow pain (Bilateral) 11/04/2020  . Chronic wrist pain (Bilateral) 11/04/2020  . Back pain without sciatica 07/18/2020  . Chronic sinusitis 07/12/2020  . Persistent atrial fibrillation ([place]) 07/12/2020  . Chest pain 04/02/2020  . Anemia in chronic kidney disease 09/12/2019  . Edema of lower extremity 09/12/2019  . Hyposmolality and/or hyponatremia 09/12/2019  . Renal cell carcinoma ([place]) 07/11/2019  . Paroxysmal atrial fibrillation ([place]) 05/09/2019  . History of TIA (transient ischemic attack) 12/06/2018  . Vision changes 12/06/2018  . TIA (transient ischemic attack) 06/06/2018  . Carotid artery disease ([place]) 06/06/2018  . Frailty 04/24/2018  . Long term current use of immunosuppressive drug 04/24/2018  . Obesity (BMI 30-39.9) 04/24/2018  . SIADH (syndrome of inappropriate ADH production) ([place]) 04/17/2017  . AKI (acute kidney injury) ([place]) 04/03/2017  . Hyponatremia 01/16/2017  . Urinary tract infection associated with indwelling urethral catheter ([place]) 12/19/2016  . Asthma with acute exacerbation 11/14/2016  . Mild intermittent asthma without complication 34/19/3790  . Mood disorder ([place]) 01/18/2016  .  Presence of  indwelling urinary catheter 08/20/2015  . Advanced directives, counseling/discussion 07/28/2014  . Routine general medical examination at a health care facility 04/09/2012  . Venous stasis dermatitis 06/13/2011  . CONSTIPATION, CHRONIC 12/07/2010  . Chronic constipation 12/07/2010  . Hyperlipidemia 12/02/2010  . OSTEOARTHRITIS 12/02/2010  . Neurogenic bladder 05/24/2010  . IBS 05/05/2010  . History of colonic polyps 05/05/2010  . NEUROPATHY 05/03/2010  . Mononeuritis 05/03/2010  . Personal history of prostate cancer 03/04/2010  . Benign essential hypertension 03/25/2009  . GAD (generalized anxiety disorder) 01/27/2007  . Coronary atherosclerosis of native coronary artery 01/27/2007  . Allergic asthma 01/27/2007  . GERD 01/27/2007  . Chronic rheumatic arthritis ([place]) 01/27/2007  . Anxiety 01/27/2007  . Rheumatoid arthritis involving multiple sites with positive rheumatoid factor ([place]) 01/27/2007    Rosalyn Gess OTR/L,CLT 02/03/2021, 5:24 PM  [place] [facility] [address], [place], [address] Phone: [phone]   Fax:  [phone]  Name: [name] MRN: [id number] Date of Birth: Apr 06, 1934

## 2021-02-05 DIAGNOSIS — J4550 Severe persistent asthma, uncomplicated: Secondary | ICD-10-CM | POA: Insufficient documentation

## 2021-02-08 ENCOUNTER — Telehealth: Payer: Self-pay | Admitting: Physician Assistant

## 2021-02-08 NOTE — Telephone Encounter (Signed)
Pt has concerns for a new bleeding rash, he says it's urological because it's in the urological area, he thinks it's coming from his catheter leaking and wants to speak to sam or a nurse. He may be reached on number in his chart or this extra number [phone]. Please advise.

## 2021-02-08 NOTE — Telephone Encounter (Signed)
Spoke with patient and his wife, they describe a diaper rash with cracking and bleeding of skin. Patient is using a diaper/barrier cream to treat. He will continue to do this and try to keep area clean and dry. He will keep cath exchange appointment on Friday and have Sam examine if rash is not better by then.

## 2021-02-09 ENCOUNTER — Telehealth: Payer: Self-pay | Admitting: Internal Medicine

## 2021-02-09 MED ORDER — OMEPRAZOLE 20 MG PO CPDR: 20.0000 mg | DELAYED_RELEASE_CAPSULE | Freq: Every day | ORAL | 1 refills | Status: DC | PRN

## 2021-02-09 NOTE — Telephone Encounter (Signed)
  LAST APPOINTMENT DATE: 01/20/2021   NEXT APPOINTMENT DATE:@Visit  date not found  MEDICATION: omeprazole (PRILOSEC) 20 MG capsule  PHARMACY: [id number] [address], [place] [facility] [address] Phone:  [phone]  Fax:  [phone]      CLINICAL FILLS OUT ALL BELOW:   LAST REFILL:  QTY:  REFILL DATE:    OTHER COMMENTS:    Okay for refill?  Please advise

## 2021-02-09 NOTE — Telephone Encounter (Signed)
Rx sent electronically.  

## 2021-02-11 ENCOUNTER — Telehealth: Payer: Self-pay | Admitting: *Deleted

## 2021-02-11 DIAGNOSIS — H0100A Unspecified blepharitis right eye, upper and lower eyelids: Secondary | ICD-10-CM | POA: Diagnosis not present

## 2021-02-11 DIAGNOSIS — H0100B Unspecified blepharitis left eye, upper and lower eyelids: Secondary | ICD-10-CM | POA: Diagnosis not present

## 2021-02-11 NOTE — Telephone Encounter (Signed)
Attempted to call for pre appointment review of allergies/meds. Message left. 

## 2021-02-12 ENCOUNTER — Other Ambulatory Visit: Payer: Self-pay

## 2021-02-12 ENCOUNTER — Telehealth: Payer: Self-pay

## 2021-02-12 ENCOUNTER — Ambulatory Visit (INDEPENDENT_AMBULATORY_CARE_PROVIDER_SITE_OTHER): Payer: Medicare PPO | Admitting: Physician Assistant

## 2021-02-12 DIAGNOSIS — N319 Neuromuscular dysfunction of bladder, unspecified: Secondary | ICD-10-CM

## 2021-02-12 DIAGNOSIS — B372 Candidiasis of skin and nail: Secondary | ICD-10-CM

## 2021-02-12 MED ORDER — KETOCONAZOLE 2 % EX CREA: 1.0000 "application " | TOPICAL_CREAM | Freq: Two times a day (BID) | CUTANEOUS | 0 refills | Status: DC

## 2021-02-12 NOTE — Telephone Encounter (Signed)
Attempted to call patient to go over pre virtual appointment questions.  [name]

## 2021-02-12 NOTE — Progress Notes (Signed)
Cath Change/ Replacement  Patient is present today for a catheter change due to urinary retention.  9 ml of water was removed from the balloon, a 16FR foley cath was removed with out difficulty.  Patient was cleaned and prepped in a sterile fashion with betadine. A 16 FR foley cath was replaced into the bladder no complications were noted Urine return was noted 4ml and urine was yellow in color. The balloon was filled with 57ml of sterile water. A leg bag was attached for drainage.      Performed by: Debroah Loop, PA-C and Gaspar Cola, CMA  Additional notes: Patient notes intertriginous rash that he has been managing with Dr. Thompson Caul diaper rash ointment, active ingredient zinc oxide.  On physical exam, he has only demarcated erythematous rash of the right inguinal crease with satellite lesions noted.  I am prescribing topical ketoconazole for antifungal treatment for likely candidal intertrigo.  Patient expressed understanding.  Follow up: Return in about 4 weeks (around 03/12/2021) for Catheter exchange.

## 2021-02-14 NOTE — Progress Notes (Signed)
NonePatient: [name]  Service Category: E/M  Provider: Gaspar Cola, MD  DOB: [birth year]  DOS: 02/15/2021  Location: Office  MRN: [id number]  Setting: Ambulatory outpatient  Referring Provider: [name], MD  Type: Established Patient  Specialty: Interventional Pain Management  PCP: [name], MD  Location: Remote location  Delivery: TeleHealth     Virtual Encounter - Pain Management PROVIDER NOTE: Information contained herein reflects review and annotations entered in association with encounter. Interpretation of such information and data should be left to medically-trained personnel. Information provided to patient can be located elsewhere in the medical record under "Patient Instructions". Document created using STT-dictation technology, any transcriptional errors that may result from process are unintentional.    Contact & Pharmacy Preferred: [phone] Home: [phone] (home) Mobile: [phone] (mobile) E-mail: [email or web address]  [id number] Lorina Rabon, [place] [address] [place] [address] [place] [address] Phone: [phone] Fax: [phone]   Pre-screening  [name] offered "in-person" vs "virtual" encounter. He indicated preferring virtual for this encounter.   Reason COVID-19*  Social distancing based on CDC and AMA recommendations.   I contacted [name] on 02/15/2021 via telephone.      I clearly identified myself as Gaspar Cola, MD. I verified that I was speaking with the correct person using two identifiers (Name: [name], and date of birth: June 09, 1934).  Consent I sought verbal advanced consent from [name] for virtual visit interactions. I informed [name] of possible security and privacy concerns, risks, and limitations associated with providing "not-in-person" medical evaluation and management services. I also informed [name] of the availability  of "in-person" appointments. Finally, I informed him that there would be a charge for the virtual visit and that he could be  personally, fully or partially, financially responsible for it. [name] expressed understanding and agreed to proceed.   Historic Elements   [name] is a 87 y.o. year old, male patient evaluated today after our last contact on 12/22/2020. [name]  has a past medical history of (HFpEF) heart failure with preserved ejection fraction ([place]), Allergy, Anemia, Anxiety, Arthritis, Asthma, Basal cell carcinoma, CAD (coronary artery disease), CHF (congestive heart failure) ([place]), Chronic kidney disease, Chronic sinusitis, Collagen vascular disease ([place]), Depression, Diverticulitis, Dysrhythmia, ED (erectile dysfunction), GERD (gastroesophageal reflux disease), History of SIADH, adenomatous colonic polyps, Hypertension, Hyponatremia, IBS (irritable bowel syndrome), Neurodermatitis, Neurogenic bladder, OSA (obstructive sleep apnea), Persistent atrial fibrillation ([place]), Prostate cancer ([place]), and Renal mass. He also  has a past surgical history that includes Prostate surgery; Nasal sinus surgery (2009); Urethra dilation (0-1655); Total knee arthroplasty (Left, 9/15); Knee Arthroplasty (Right, 09/02/2015); permanent indwelling catheter; Cardioversion (N/A, 04/23/2019); and Laparoscopic nephrectomy, hand assisted (Right, 06/17/2019). [name] has a current medication list which includes the following prescription(s): acetaminophen, alendronate, aloe, alprazolam, ascorbic acid, calcium-magnesium-zinc, chlorhexidine, vitamin d3, co q 10, cranberry, eliquis, epinephrine, fluticasone, folic acid, furosemide, gabapentin, hydroxychloroquine, ketoconazole, ketoconazole, methotrexate, metoprolol tartrate, milk thistle, multivitamin, multiple vitamins-minerals, myrbetriq, fish oil, omeprazole, choline, polyethylene glycol powder, pravastatin, align, selenium, sodium chloride, triamcinolone ointment,  turmeric, vitamin b-12, and zinc, and the following Facility-Administered Medications: omalizumab and omalizumab. He  reports that he has never smoked. He has never used smokeless tobacco. He reports current alcohol use. He reports that he does not use drugs. [name] is allergic to ciprofloxacin, citalopram hydrobromide, clindamycin, lorazepam, paroxetine, ramipril, simvastatin, and sulfa antibiotics.  HPI  Today, he is being contacted for follow-up evaluation to discuss having an interventional therapy for his pain.  Patient initially sent to [place] as a  "Fast-Track" on 11/04/2020.  On 12/22/2020 we have decided to proceed with a midline L2-3 LESI under fluoroscopic guidance and IV sedation however, for some reason, the ball was dropped and the patient was not scheduled to come in.  Due to the delay, the insurance window closed and he was unable to get his appointment for this LESI.  Today he is calling to request that we reenter an order for it and see if he can have it approved so that he can come in and have that epidural.  The patient was contacted on 12/08/2020 after having had a diagnostic bilateral lumbar facet block done on 11/24/2020.  During my telephone conversation with the patient, after I had explained the purpose of the local anesthetic and steroid, he then changed his answers and indicated that he had attained 100% relief of the pain for the first hour, followed by a decrease to a 50% for the next 4 to 6 hours and then the relief going down to 0.  Unfortunately, because it was a virtual visit I did not have the opportunity to fully evaluate him therefore I have rescheduled him to come back for this follow-up.  He was sent to our clinics as a"Fast-Track"for a lumbar epidural steroid injection, by [name]. Apparently the patient had previously been seen at the [facility] by [name] who apparently performed on 11/04/2021bilateral S1 trigger point injections. In  addition the patient apparently also had some bilateral L5 trigger point injections done by Carolee Rota, FNP. The patient was referred to [place] by her for bilateral L5-S1 transforaminal ESI x2.   The primary area of painis described to be that of the lower back, bilaterally.This pain goes down to the tailbone area in the buttocks area. The patient has no typical radicular symptoms. Review of the patient's last lumbar MRI reveals:  09/21/2020 lumbar MRIFINDINGS: Alignment:levoscoliosiswith apex at L4. Grade 1 retrolisthesis at L2-3 and L3-4 Paraspinal and other soft tissues:Absent right kidney  Disc levels: L1-2:Left asymmetric disc bulge. L2-3:Endplate spurringwithdisc space narrowing.  L3-4:Mild disc bulge. Mildrightand no left neural foraminal stenosis. L4-5:Small disc bulgewith moderate facet hypertrophy.Moderate rightand no left neural foraminal stenosis. Rightforaminal stenosis is progressed. L5-S1:Small disc bulge. No spinal canal stenosis. No right, butmildleftneural foraminal stenosis.  Visualized sacrum:Normal.  IMPRESSION: 1.Multilevel lumbar degenerative disc diseasewith levoscoliosis and grade 1 retrolisthesis at L2-3 and L3-4. 2.Progression of moderate rightL4-5 neural foraminal stenosis. 3.No spinal canal stenosis.  On that visit, we spent quite a bit of time going over the diagnostic injections and how they work.  We talked about local anesthetics as well as the steroids.  I showed him the images of the procedure and we also went over some of the images of his MRI.  The patient denied any lower extremity symptoms including groin pain.  However he does have a lot of generalized deconditioning but it was clear that he was also having difficulty with hip flexion, which is usually associated with an L2 radiculopathy/radiculitis.  Going over the images of the MRI, it was clear to me that he does have some central canal stenosis at the L2-3 level  that seems to be more significant than the other levels.  Because the pain that he is experiencing seems to follow the L1/L2 dermatomes and he presents with weakness on the hip flexors,  I decided to bring him in for a midline L2-3 LESI under fluoroscopic guidance to see if this can provide him with better relief and the lumbar facets.  Based on the results described by the patient from the diagnostic lumbar facet block, the facet arthralgia is only contributing about 50% to his total pain. The rest seems to be coming from somewhere else, namely the L2-3 region.   Pharmacotherapy Assessment  Analgesic: None MME/day: 0 mg/day   Monitoring: [place] PMP: PDMP reviewed during this encounter.       Pharmacotherapy: No side-effects or adverse reactions reported. Compliance: No problems identified. Effectiveness: Clinically acceptable. Plan: Refer to "POC".  UDS:  Summary  Date Value Ref Range Status  11/04/2020 Note  Final    Comment:    ==================================================================== Compliance Drug Analysis, Ur ==================================================================== Specimen Alert Note: Urinary creatinine is low; ability to detect some drugs may be compromised. Interpret results with caution. (Creatinine) ==================================================================== Test                             Result       Flag       Units  Drug Present and Declared for Prescription Verification   Alprazolam                     500          EXPECTED   ng/mg creat   Alpha-hydroxyalprazolam        1265         EXPECTED   ng/mg creat    Source of alprazolam is a scheduled prescription medication. Alpha-    hydroxyalprazolam is an expected metabolite of alprazolam.    Gabapentin                     PRESENT      EXPECTED   Acetaminophen                  PRESENT      EXPECTED   Metoprolol                     PRESENT       EXPECTED ==================================================================== Test                      Result    Flag   Units      Ref Range   Creatinine              17        LL     mg/dL      >=20 ==================================================================== Declared Medications:  The flagging and interpretation on this report are based on the  following declared medications.  Unexpected results may arise from  inaccuracies in the declared medications.   **Note: The testing scope of this panel includes these medications:   Alprazolam (Xanax)  Gabapentin (Neurontin)  Metoprolol (Lopressor)   **Note: The testing scope of this panel does not include small to  moderate amounts of these reported medications:   Acetaminophen (Tylenol)   **Note: The testing scope of this panel does not include the  following reported medications:   Alendronate (Fosamax)  Apixaban (Eliquis)  Bifidobacterium infantis (Align)  Calcium  Chlorhexidine (Peridex)  Choline  Cyanocobalamin  Epinephrine (EpiPen)  Fish Oil  Fluticasone (Flonase)  Folic Acid  Furosemide (Lasix)  Hydroxychloroquine (Plaquenil)  Ketoconazole (Nizoral)  Magnesium  Methotrexate  Mirabegron (Myrbetriq)  Multivitamin  Omeprazole (Prilosec)  Polyethylene Glycol (MiraLAX)  Pravastatin (Pravachol)  Selenium  Sodium Chloride  Supplement  Triamcinolone (Kenalog)  Turmeric  Ubiquinone (CoQ10)  Vitamin D3  Zinc ==================================================================== For clinical consultation, please call [phone]. ====================================================================     Laboratory Chemistry Profile   Renal Lab Results  Component Value Date   BUN 19 11/05/2020   CREATININE 1.10 01/18/2021   GFR 72.02 05/11/2020   GFRAA >60 06/22/2019   GFRNONAA >60 11/05/2020     Hepatic Lab Results  Component Value Date   AST 22 11/05/2020   ALT 17 11/05/2020   ALBUMIN 3.4 (L)  11/05/2020   ALKPHOS 51 11/05/2020   LIPASE 50 10/28/2018     Electrolytes Lab Results  Component Value Date   NA 132 (L) 11/05/2020   K 4.4 11/05/2020   CL 96 (L) 11/05/2020   CALCIUM 8.6 (L) 11/05/2020   MG 2.1 11/05/2020   PHOS 3.8 05/11/2020     Bone Lab Results  Component Value Date   25OHVITD1 45 11/05/2020   25OHVITD2 <1.0 11/05/2020   25OHVITD3 45 11/05/2020     Inflammation (CRP: Acute Phase) (ESR: Chronic Phase) Lab Results  Component Value Date   CRP 1.5 (H) 11/05/2020   ESRSEDRATE 46 (H) 11/05/2020   LATICACIDVEN 1.5 10/18/2017       Note: Above Lab results reviewed.  Imaging  Chest 1 View CLINICAL DATA:  Postop right nephrectomy for renal cell carcinoma  EXAM: CHEST  1 VIEW  COMPARISON:  Radiographs 01/17/2020 and 05/30/2018. CT abdomen today.  FINDINGS: The heart size and mediastinal contours are stable with aortic atherosclerosis. There is stable pleuroparenchymal scarring at the left lung base. There is a stable left basilar calcified granuloma. The lungs are otherwise clear. Azygos fissure noted. Advanced arthropathic changes are present at both glenohumeral joints with narrowing of the subacromial space consistent with chronic rotator cuff tears.  IMPRESSION: Stable chest. No acute cardiopulmonary process.  Electronically Signed   By: Richardean Sale M.D.   On: 01/19/2021 12:21 CT Abdomen Pelvis W Contrast CLINICAL DATA:  Follow-up renal cell carcinoma post right nephrectomy 06/17/2019.  EXAM: CT ABDOMEN AND PELVIS WITH CONTRAST  TECHNIQUE: Multidetector CT imaging of the abdomen and pelvis was performed using the standard protocol following bolus administration of intravenous contrast.  CONTRAST:  1101m OMNIPAQUE IOHEXOL 300 MG/ML  SOLN  COMPARISON:  CT 01/17/2020 and 05/21/2019  FINDINGS: Lower chest: Stable chronic left pleural thickening and bilateral lower lobe scarring. There is cardiomegaly, calcifications of  the aortic valve and mitral annulus and atherosclerosis of the aorta and coronary arteries.  Hepatobiliary: The liver is normal in density without suspicious focal abnormality. No evidence of gallstones, gallbladder wall thickening or biliary dilatation.  Pancreas: Unremarkable. No pancreatic ductal dilatation or surrounding inflammatory changes.  Spleen: Stable small enhancing lesion posteriorly in the spleen (image 10/2). Otherwise unremarkable.  Adrenals/Urinary Tract: Both adrenal glands appear normal. Status post right nephrectomy without mass in the nephrectomy bed. The left kidney appears stable with small cysts. There is no enhancing renal mass, urinary tract calculus or hydronephrosis. The bladder is decompressed by a Foley catheter.  Stomach/Bowel: Enteric contrast was administered. There is a small hiatal hernia. The stomach and small bowel otherwise appear normal. The appendix appears normal. There is moderate stool throughout the colon and mild sigmoid diverticulosis. No bowel wall thickening, distention or surrounding inflammation.  Vascular/Lymphatic: There are no enlarged abdominal or pelvic lymph nodes. No acute vascular findings. Diffuse  aortic and branch vessel atherosclerosis. The left renal vein, IVC, portal, superior mesenteric and splenic veins are patent.  Reproductive: Status post prostatectomy without recurrent mass lesion.  Other: Postsurgical changes in both inguinal regions. No ascites or peritoneal nodularity.  Musculoskeletal: No acute or significant osseous findings. Thoracolumbar scoliosis and associated spondylosis.  IMPRESSION: 1. Stable examination status post right nephrectomy and prostatectomy. No evidence of local recurrence or metastatic disease. 2. Stable left renal cysts. 3. Aortic Atherosclerosis (ICD10-I70.0).  Electronically Signed   By: Richardean Sale M.D.   On: 01/19/2021 12:19  Assessment  The primary encounter  diagnosis was Chronic pain syndrome. Diagnoses of Chronic low back pain (Bilateral) w/o sciatica, DDD (degenerative disc disease), lumbosacral, Grade 1 Retrolisthesis at L2-3 and L3-4, Abnormal MRI, lumbar spine (09/21/2020), and Chronic anticoagulation (Eliquis) were also pertinent to this visit.  Plan of Care  Problem-specific:  No problem-specific Assessment & Plan notes found for this encounter.  Mr. TAM DELISLE has a current medication list which includes the following long-term medication(s): calcium-magnesium-zinc, eliquis, fluticasone, furosemide, gabapentin, metoprolol tartrate, myrbetriq, omeprazole, and pravastatin.  Pharmacotherapy (Medications Ordered): No orders of the defined types were placed in this encounter.  Orders:  Orders Placed This Encounter  Procedures  . Lumbar Epidural Injection    Standing Status:   Future    Standing Expiration Date:   03/18/2021    Scheduling Instructions:     Procedure: Interlaminar Lumbar Epidural Steroid injection (LESI)  L2-3     Laterality: Midline     Sedation: Patient's choice.     Timeframe: ASAA    Order Specific Question:   Where will this procedure be performed?    Answer:   [facility]  . Blood Thinner Instructions to Nursing    Always make sure patient has clearance from prescribing physician to stop blood thinners for interventional therapies. If the patient requires a Lovenox-bridge therapy, make sure arrangements are made to institute it with the assistance of the PCP.    Scheduling Instructions:     Have Mr. Sanfilippo stop the Eliquis (Apixaban) x 3 days prior to procedure or surgery.   Follow-up plan:   Return for Procedure (w/ sedation): (ML) L2-3 LESI #1, (Blood Thinner Protocol).      Interventional Therapies  Risk  Complexity Considerations:   Eliquis anticoagulation (Stop: 3 days  Restart: 6 hours)  Plaquenil anticoagulation (Stop: 11 days)  "Fast-Track" Referral (11/04/2020) Advanced age  Prior patient  of [name]    Planned  Pending:   Diagnostic midline L2-3 LESI #1    Under consideration:   Diagnostic bilateral lumbar facet block #2  Diagnostic midline L2-3 LESI #1  Diagnostic interspinous process ligament injections (R/o kissing spine syndrome)   Completed:   Diagnostic bilateral lumbar facet MBB x1 (11/24/2020) (50% for the duration of the local anesthetic)   Palliative options:   None established    Recent Visits Date Type Provider Dept  12/22/20 Office Visit Milinda Pointer, MD [facility]  12/08/20 Telemedicine Milinda Pointer, MD [facility]  11/24/20 Procedure visit Milinda Pointer, MD [facility]  Showing recent visits within past 90 days and meeting all other requirements Today's Visits Date Type Provider Dept  02/15/21 Telemedicine Milinda Pointer, MD [facility]  Showing today's visits and meeting all other requirements Future Appointments No visits were found meeting these conditions. Showing future appointments within next 90 days and meeting all other requirements  I discussed the assessment and treatment plan  with the patient. The patient was provided an opportunity to ask questions and all were answered. The patient agreed with the plan and demonstrated an understanding of the instructions.  Patient advised to call back or seek an in-person evaluation if the symptoms or condition worsens.  Duration of encounter: 13 minutes.  Note by: Gaspar Cola, MD Date: 02/15/2021; Time: 6:06 PM

## 2021-02-15 ENCOUNTER — Ambulatory Visit: Payer: Medicare PPO | Attending: Pain Medicine | Admitting: Pain Medicine

## 2021-02-15 ENCOUNTER — Telehealth: Payer: Self-pay

## 2021-02-15 ENCOUNTER — Other Ambulatory Visit: Payer: Self-pay

## 2021-02-15 DIAGNOSIS — G894 Chronic pain syndrome: Secondary | ICD-10-CM | POA: Diagnosis not present

## 2021-02-15 DIAGNOSIS — M5137 Other intervertebral disc degeneration, lumbosacral region: Secondary | ICD-10-CM

## 2021-02-15 DIAGNOSIS — R937 Abnormal findings on diagnostic imaging of other parts of musculoskeletal system: Secondary | ICD-10-CM | POA: Diagnosis not present

## 2021-02-15 DIAGNOSIS — M51379 Other intervertebral disc degeneration, lumbosacral region without mention of lumbar back pain or lower extremity pain: Secondary | ICD-10-CM

## 2021-02-15 DIAGNOSIS — G8929 Other chronic pain: Secondary | ICD-10-CM | POA: Diagnosis not present

## 2021-02-15 DIAGNOSIS — Z7901 Long term (current) use of anticoagulants: Secondary | ICD-10-CM | POA: Diagnosis not present

## 2021-02-15 DIAGNOSIS — M545 Low back pain, unspecified: Secondary | ICD-10-CM | POA: Diagnosis not present

## 2021-02-15 DIAGNOSIS — M4310 Spondylolisthesis, site unspecified: Secondary | ICD-10-CM | POA: Diagnosis not present

## 2021-02-15 NOTE — Progress Notes (Signed)
Virtual Visit via Video Note  I connected with [name] on 01/20/21 at  1:30 PM EST by a video enabled telemedicine application and verified that I am speaking with the correct person using two identifiers.  Location: Patient: home, accompanied by daughter Provider: home   I discussed the limitations of evaluation and management by telemedicine and the availability of in person appointments. The patient expressed understanding and agreed to proceed.  History of Present Illness: 87 year old male with a personal history of pT3a RCC status post nephrectomy who returns today for routine surveillance imaging.  s/p right hand-assisted laparoscopic nephrectomy on 06/17/2019 proximately 3 cm right lower pole renal lesion with concern for invasion of the renal sinus. His postoperative course was complicated by an ileus resolved spontaneously and delayed mobility. He is ultimately discharged approximately 1 week postop to home with physical therapy.  Surgical pathology consistent with pT3aNx , Furhman grade 2with invasion into the renal sinus.  Cr 1.10 on 01/18/21  His bladder continues to be managed with chronic indwelling Foley catheter.  His personal history of prostate cancer status post prostatectomy and salvage radiation 1998.  His small capacity bladder with storage related issues.  He takes Myrbetriq for bladder spasm.  No new changes in his medical history.  He feels well.  Interval imaging in the form of CT abdomen pelvis with contrast on 01/19/2021 was personally reviewed today was chest x-ray negative for any evidence of metastatic disease.  Agree with radiologic interpretation.   Observations/Objective: Alert, pleasant  Assessment and Plan:  1. Renal cell cancer, right (HCC) No evidence of disease  Continue annual surveillance for total of 3 years  Follow-up in 1 year with CT abdomen pelvis/chest x-ray - CT Abdomen Pelvis W Contrast; Future - Chest 1 View; Future  2.  Neurogenic bladder Monthly cath exchanges- continue Myrbetriq 50 for bladder spasms   Follow Up Instructions: Follow-up in 1 year with repeat cross-sectional imaging/chest x-ray   I discussed the assessment and treatment plan with the patient. The patient was provided an opportunity to ask questions and all were answered. The patient agreed with the plan and demonstrated an understanding of the instructions.   The patient was advised to call back or seek an in-person evaluation if the symptoms worsen or if the condition fails to improve as anticipated.  I provided 15 minutes of non-face-to-face time during this encounter.   Hollice Espy, MD

## 2021-02-15 NOTE — Telephone Encounter (Signed)
Attempted to call patient for pre virtual appointment questions.  No answer.

## 2021-02-15 NOTE — Patient Instructions (Signed)
____________________________________________________________________________________________  Preparing for Procedure with Sedation  Procedure appointments are limited to planned procedures: . No Prescription Refills. . No disability issues will be discussed. . No medication changes will be discussed.  Instructions: . Oral Intake: Do not eat or drink anything for at least 8 hours prior to your procedure. (Exception: Blood Pressure Medication. See below.) . Transportation: Unless otherwise stated by your physician, you may drive yourself after the procedure. . Blood Pressure Medicine: Do not forget to take your blood pressure medicine with a sip of water the morning of the procedure. If your Diastolic (lower reading)is above 100 mmHg, elective cases will be cancelled/rescheduled. . Blood thinners: These will need to be stopped for procedures. Notify our staff if you are taking any blood thinners. Depending on which one you take, there will be specific instructions on how and when to stop it. . Diabetics on insulin: Notify the staff so that you can be scheduled 1st case in the morning. If your diabetes requires high dose insulin, take only  of your normal insulin dose the morning of the procedure and notify the staff that you have done so. . Preventing infections: Shower with an antibacterial soap the morning of your procedure. . Build-up your immune system: Take 1000 mg of Vitamin C with every meal (3 times a day) the day prior to your procedure. . Antibiotics: Inform the staff if you have a condition or reason that requires you to take antibiotics before dental procedures. . Pregnancy: If you are pregnant, call and cancel the procedure. . Sickness: If you have a cold, fever, or any active infections, call and cancel the procedure. . Arrival: You must be in the facility at least 30 minutes prior to your scheduled procedure. . Children: Do not bring children with you. . Dress appropriately:  Bring dark clothing that you would not mind if they get stained. . Valuables: Do not bring any jewelry or valuables.  Reasons to call and reschedule or cancel your procedure: (Following these recommendations will minimize the risk of a serious complication.) . Surgeries: Avoid having procedures within 2 weeks of any surgery. (Avoid for 2 weeks before or after any surgery). . Flu Shots: Avoid having procedures within 2 weeks of a flu shots or . (Avoid for 2 weeks before or after immunizations). . Barium: Avoid having a procedure within 7-10 days after having had a radiological study involving the use of radiological contrast. (Myelograms, Barium swallow or enema study). . Heart attacks: Avoid any elective procedures or surgeries for the initial 6 months after a "Myocardial Infarction" (Heart Attack). . Blood thinners: It is imperative that you stop these medications before procedures. Let us know if you if you take any blood thinner.  . Infection: Avoid procedures during or within two weeks of an infection (including chest colds or gastrointestinal problems). Symptoms associated with infections include: Localized redness, fever, chills, night sweats or profuse sweating, burning sensation when voiding, cough, congestion, stuffiness, runny nose, sore throat, diarrhea, nausea, vomiting, cold or Flu symptoms, recent or current infections. It is specially important if the infection is over the area that we intend to treat. . Heart and lung problems: Symptoms that may suggest an active cardiopulmonary problem include: cough, chest pain, breathing difficulties or shortness of breath, dizziness, ankle swelling, uncontrolled high or unusually low blood pressure, and/or palpitations. If you are experiencing any of these symptoms, cancel your procedure and contact your primary care physician for an evaluation.  Remember:  Regular Business hours are:    Monday to Thursday 8:00 AM to 4:00 PM  Provider's  Schedule: Milinda Pointer, MD:  Procedure days: Tuesday and Thursday 7:30 AM to 4:00 PM  Gillis Santa, MD:  Procedure days: Monday and Wednesday 7:30 AM to 4:00 PM ____________________________________________________________________________________________   ____________________________________________________________________________________________  Blood Thinners  IMPORTANT NOTICE:  If you take any of these, make sure to notify the nursing staff.  Failure to do so may result in injury.  Recommended time intervals to stop and restart blood-thinners, before & after invasive procedures  Generic Name Brand Name Stop Time. Must be stopped at least this long before procedures. After procedures, wait at least this long before re-starting.  Abciximab Reopro 15 days 2 hrs  Alteplase Activase 10 days 10 days  Anagrelide Agrylin    Apixaban Eliquis 3 days 6 hrs  Cilostazol Pletal 3 days 5 hrs  Clopidogrel Plavix 7-10 days 2 hrs  Dabigatran Pradaxa 5 days 6 hrs  Dalteparin Fragmin 24 hours 4 hrs  Dipyridamole Aggrenox 11days 2 hrs  Edoxaban Lixiana; Savaysa 3 days 2 hrs  Enoxaparin  Lovenox 24 hours 4 hrs  Eptifibatide Integrillin 8 hours 2 hrs  Fondaparinux  Arixtra 72 hours 12 hrs  Hydroxychloroquine Plaquenil 11 days   Prasugrel Effient 7-10 days 6 hrs  Reteplase Retavase 10 days 10 days  Rivaroxaban Xarelto 3 days 6 hrs  Ticagrelor Brilinta 5-7 days 6 hrs  Ticlopidine Ticlid 10-14 days 2 hrs  Tinzaparin Innohep 24 hours 4 hrs  Tirofiban Aggrastat 8 hours 2 hrs  Warfarin Coumadin 5 days 2 hrs   Other medications with blood-thinning effects  Product indications Generic (Brand) names Note  Cholesterol Lipitor Stop 4 days before procedure  Blood thinner (injectable) Heparin (LMW or LMWH Heparin) Stop 24 hours before procedure  Cancer Ibrutinib (Imbruvica) Stop 7 days before procedure  Malaria/Rheumatoid Hydroxychloroquine (Plaquenil) Stop 11 days before procedure   Thrombolytics  10 days before or after procedures   Over-the-counter (OTC) Products with blood-thinning effects  Product Common names Stop Time  Aspirin > 325 mg Goody Powders, Excedrin, etc. 11 days  Aspirin ? 81 mg  7 days  Fish oil  4 days  Garlic supplements  7 days  Ginkgo biloba  36 hours  Ginseng  24 hours  NSAIDs Ibuprofen, Naprosyn, etc. 3 days  Vitamin E  4 days   ____________________________________________________________________________________________  ____________________________________________________________________________________________  General Risks and Possible Complications  Patient Responsibilities: It is important that you read this as it is part of your informed consent. It is our duty to inform you of the risks and possible complications associated with treatments offered to you. It is your responsibility as a patient to read this and to ask questions about anything that is not clear or that you believe was not covered in this document.  Patient's Rights: You have the right to refuse treatment. You also have the right to change your mind, even after initially having agreed to have the treatment done. However, under this last option, if you wait until the last second to change your mind, you may be charged for the materials used up to that point.  Introduction: Medicine is not an Chief Strategy Officer. Everything in Medicine, including the lack of treatment(s), carries the potential for danger, harm, or loss (which is by definition: Risk). In Medicine, a complication is a secondary problem, condition, or disease that can aggravate an already existing one. All treatments carry the risk of possible complications. The fact that a side effects or complications occurs, does not  imply that the treatment was conducted incorrectly. It must be clearly understood that these can happen even when everything is done following the highest safety standards.  No treatment: You can  choose not to proceed with the proposed treatment alternative. The "PRO(s)" would include: avoiding the risk of complications associated with the therapy. The "CON(s)" would include: not getting any of the treatment benefits. These benefits fall under one of three categories: diagnostic; therapeutic; and/or palliative. Diagnostic benefits include: getting information which can ultimately lead to improvement of the disease or symptom(s). Therapeutic benefits are those associated with the successful treatment of the disease. Finally, palliative benefits are those related to the decrease of the primary symptoms, without necessarily curing the condition (example: decreasing the pain from a flare-up of a chronic condition, such as incurable terminal cancer).  General Risks and Complications: These are associated to most interventional treatments. They can occur alone, or in combination. They fall under one of the following six (6) categories: no benefit or worsening of symptoms; bleeding; infection; nerve damage; allergic reactions; and/or death. 1. No benefits or worsening of symptoms: In Medicine there are no guarantees, only probabilities. No healthcare provider can ever guarantee that a medical treatment will work, they can only state the probability that it may. Furthermore, there is always the possibility that the condition may worsen, either directly, or indirectly, as a consequence of the treatment. 2. Bleeding: This is more common if the patient is taking a blood thinner, either prescription or over the counter (example: Goody Powders, Fish oil, Aspirin, Garlic, etc.), or if suffering a condition associated with impaired coagulation (example: Hemophilia, cirrhosis of the liver, low platelet counts, etc.). However, even if you do not have one on these, it can still happen. If you have any of these conditions, or take one of these drugs, make sure to notify your treating physician. 3. Infection: This is more  common in patients with a compromised immune system, either due to disease (example: diabetes, cancer, human immunodeficiency virus [HIV], etc.), or due to medications or treatments (example: therapies used to treat cancer and rheumatological diseases). However, even if you do not have one on these, it can still happen. If you have any of these conditions, or take one of these drugs, make sure to notify your treating physician. 4. Nerve Damage: This is more common when the treatment is an invasive one, but it can also happen with the use of medications, such as those used in the treatment of cancer. The damage can occur to small secondary nerves, or to large primary ones, such as those in the spinal cord and brain. This damage may be temporary or permanent and it may lead to impairments that can range from temporary numbness to permanent paralysis and/or brain death. 5. Allergic Reactions: Any time a substance or material comes in contact with our body, there is the possibility of an allergic reaction. These can range from a mild skin rash (contact dermatitis) to a severe systemic reaction (anaphylactic reaction), which can result in death. 6. Death: In general, any medical intervention can result in death, most of the time due to an unforeseen complication. ____________________________________________________________________________________________

## 2021-02-16 ENCOUNTER — Other Ambulatory Visit: Payer: Self-pay

## 2021-02-16 ENCOUNTER — Ambulatory Visit (INDEPENDENT_AMBULATORY_CARE_PROVIDER_SITE_OTHER): Payer: Medicare PPO

## 2021-02-16 VITALS — BP 116/67 | HR 57 | Temp 97.6°F

## 2021-02-16 DIAGNOSIS — J4540 Moderate persistent asthma, uncomplicated: Secondary | ICD-10-CM | POA: Diagnosis not present

## 2021-02-16 DIAGNOSIS — J4550 Severe persistent asthma, uncomplicated: Secondary | ICD-10-CM | POA: Diagnosis not present

## 2021-02-16 MED ORDER — SODIUM CHLORIDE 0.9 % IV SOLN: Freq: Once | INTRAVENOUS | Status: DC | PRN

## 2021-02-16 MED ORDER — ALBUTEROL SULFATE HFA 108 (90 BASE) MCG/ACT IN AERS: 2.0000 | INHALATION_SPRAY | Freq: Once | RESPIRATORY_TRACT | Status: DC | PRN

## 2021-02-16 MED ORDER — METHYLPREDNISOLONE SODIUM SUCC 125 MG IJ SOLR: 125.0000 mg | Freq: Once | INTRAMUSCULAR | Status: DC | PRN

## 2021-02-16 MED ORDER — FAMOTIDINE IN NACL 20-0.9 MG/50ML-% IV SOLN: 20.0000 mg | Freq: Once | INTRAVENOUS | Status: DC | PRN

## 2021-02-16 MED ORDER — DIPHENHYDRAMINE HCL 50 MG/ML IJ SOLN: 50.0000 mg | Freq: Once | INTRAMUSCULAR | Status: DC | PRN

## 2021-02-16 MED ORDER — EPINEPHRINE 0.3 MG/0.3ML IJ SOAJ: 0.3000 mg | Freq: Once | INTRAMUSCULAR | Status: DC | PRN

## 2021-02-16 MED ORDER — OMALIZUMAB 150 MG/ML ~~LOC~~ SOSY
375.0000 mg | PREFILLED_SYRINGE | Freq: Once | SUBCUTANEOUS | Status: AC
  Administered 2021-02-16: 375 mg via SUBCUTANEOUS
  Filled 2021-02-16: qty 0.5
  Filled 2021-02-16: qty 2

## 2021-02-16 NOTE — Progress Notes (Signed)
Diagnosis: Asthma  Provider: Marshell Garfinkel   Procedure: Injection, Medication: Xolair, Site: subcutaneous x3  by .Tarron Krolak  Discharge: Condition: Good, Destination: Home . AVS provided. by .Falisha Osment

## 2021-02-23 DIAGNOSIS — E871 Hypo-osmolality and hyponatremia: Secondary | ICD-10-CM | POA: Diagnosis not present

## 2021-02-23 DIAGNOSIS — N182 Chronic kidney disease, stage 2 (mild): Secondary | ICD-10-CM | POA: Diagnosis not present

## 2021-02-25 DIAGNOSIS — D631 Anemia in chronic kidney disease: Secondary | ICD-10-CM | POA: Diagnosis not present

## 2021-02-25 DIAGNOSIS — E871 Hypo-osmolality and hyponatremia: Secondary | ICD-10-CM | POA: Diagnosis not present

## 2021-02-25 DIAGNOSIS — N182 Chronic kidney disease, stage 2 (mild): Secondary | ICD-10-CM | POA: Diagnosis not present

## 2021-02-25 DIAGNOSIS — R600 Localized edema: Secondary | ICD-10-CM | POA: Diagnosis not present

## 2021-03-01 ENCOUNTER — Ambulatory Visit
Admission: RE | Admit: 2021-03-01 | Discharge: 2021-03-01 | Disposition: A | Discharge status: Home / Self Care | Payer: Medicare PPO | Source: Ambulatory Visit | Attending: Nephrology | Admitting: Nephrology

## 2021-03-01 ENCOUNTER — Other Ambulatory Visit: Payer: Self-pay

## 2021-03-01 DIAGNOSIS — R609 Edema, unspecified: Secondary | ICD-10-CM | POA: Diagnosis not present

## 2021-03-01 MED ORDER — FUROSEMIDE 10 MG/ML IJ SOLN
INTRAMUSCULAR | Status: AC
  Administered 2021-03-01: 40 mg via INTRAVENOUS
  Filled 2021-03-01: qty 4

## 2021-03-01 MED ORDER — POTASSIUM CHLORIDE CRYS ER 20 MEQ PO TBCR: 20.0000 meq | EXTENDED_RELEASE_TABLET | Freq: Once | ORAL | Status: AC

## 2021-03-01 MED ORDER — FUROSEMIDE 10 MG/ML IJ SOLN: 40.0000 mg | Freq: Once | INTRAMUSCULAR | Status: AC

## 2021-03-01 MED ORDER — POTASSIUM CHLORIDE CRYS ER 20 MEQ PO TBCR
EXTENDED_RELEASE_TABLET | ORAL | Status: AC
  Administered 2021-03-01: 20 meq via ORAL
  Filled 2021-03-01: qty 1

## 2021-03-01 NOTE — Progress Notes (Signed)
Diagnosis: Asthma  Provider:  Marshell Garfinkel, MD  Procedure: Injection  Xolair (Omalizumab), Dose: 375 mg, Site: subcutaneous  Discharge: Condition: Good, Destination: Home . AVS provided to patient.   Performed by:  Jonelle Sidle, RN

## 2021-03-01 NOTE — Discharge Instructions (Signed)
Furosemide (LasixT) What is this medication used for?  This medication is used to prevent excessive fluid in the lungs.  It can also be used to treat generalized swelling and high blood pressure.  How should this medication be given?  . Shake well before measuring the dose.  . Measure the correct dose using an oral syringe. . Place the syringe in the infant's mouth and give small amounts, allowing time for them to swallow after each squirt.  . Should be given with food to avoid stomach upset. . Give at the same time every day to avoid changes in blood pressure.  What should be done if a dose is missed? If a dose is missed, give it as soon as you remember. If it is close to the time for the next dose, simply skip the missed dose and restart the regular dosing schedule. It is important NOT to give double the recommended dose.   Are there any side effects?  . Changes in electrolytes that may require your baby's doctor to check labs. . Low blood pressure, especially if given with other medications that reduce blood pressure. . May cause diarrhea, vomiting, and constipation.  Other important information: . Store at room temperature. . Do not stop this medication without calling your baby's doctor. 

## 2021-03-02 ENCOUNTER — Ambulatory Visit (INDEPENDENT_AMBULATORY_CARE_PROVIDER_SITE_OTHER): Payer: Medicare PPO

## 2021-03-02 VITALS — BP 147/79 | Temp 98.0°F | Resp 16

## 2021-03-02 DIAGNOSIS — J4550 Severe persistent asthma, uncomplicated: Secondary | ICD-10-CM

## 2021-03-02 DIAGNOSIS — J4540 Moderate persistent asthma, uncomplicated: Secondary | ICD-10-CM | POA: Diagnosis not present

## 2021-03-02 MED ORDER — EPINEPHRINE 0.3 MG/0.3ML IJ SOAJ: 0.3000 mg | Freq: Once | INTRAMUSCULAR | Status: DC | PRN

## 2021-03-02 MED ORDER — OMALIZUMAB 150 MG/ML ~~LOC~~ SOSY
375.0000 mg | PREFILLED_SYRINGE | Freq: Once | SUBCUTANEOUS | Status: AC
  Administered 2021-03-02: 375 mg via SUBCUTANEOUS
  Filled 2021-03-02: qty 2

## 2021-03-02 MED ORDER — ALBUTEROL SULFATE HFA 108 (90 BASE) MCG/ACT IN AERS: 2.0000 | INHALATION_SPRAY | Freq: Once | RESPIRATORY_TRACT | Status: DC | PRN

## 2021-03-02 MED ORDER — FAMOTIDINE IN NACL 20-0.9 MG/50ML-% IV SOLN: 20.0000 mg | Freq: Once | INTRAVENOUS | Status: DC | PRN

## 2021-03-02 MED ORDER — METHYLPREDNISOLONE SODIUM SUCC 125 MG IJ SOLR: 125.0000 mg | Freq: Once | INTRAMUSCULAR | Status: DC | PRN

## 2021-03-02 MED ORDER — DIPHENHYDRAMINE HCL 50 MG/ML IJ SOLN: 50.0000 mg | Freq: Once | INTRAMUSCULAR | Status: DC | PRN

## 2021-03-02 MED ORDER — SODIUM CHLORIDE 0.9 % IV SOLN: Freq: Once | INTRAVENOUS | Status: DC | PRN

## 2021-03-02 NOTE — Progress Notes (Signed)
Diagnosis: Asthma  Provider:  Marshell Garfinkel, MD  Procedure: Injection  Xolair (Omalizumab), Dose: 375 mg, Site: subcutaneous(Abdominal area).  Discharge: Condition: Good, Destination: Home . AVS provided to patient.   Performed by:  Koren Shiver, RN

## 2021-03-03 ENCOUNTER — Ambulatory Visit
Admission: RE | Admit: 2021-03-03 | Discharge: 2021-03-03 | Disposition: A | Discharge status: Home / Self Care | Payer: Medicare PPO | Source: Ambulatory Visit | Attending: Nephrology | Admitting: Nephrology

## 2021-03-03 ENCOUNTER — Other Ambulatory Visit: Payer: Self-pay

## 2021-03-03 DIAGNOSIS — E871 Hypo-osmolality and hyponatremia: Secondary | ICD-10-CM | POA: Insufficient documentation

## 2021-03-03 DIAGNOSIS — D631 Anemia in chronic kidney disease: Secondary | ICD-10-CM | POA: Diagnosis not present

## 2021-03-03 DIAGNOSIS — N182 Chronic kidney disease, stage 2 (mild): Secondary | ICD-10-CM | POA: Diagnosis not present

## 2021-03-03 DIAGNOSIS — I129 Hypertensive chronic kidney disease with stage 1 through stage 4 chronic kidney disease, or unspecified chronic kidney disease: Secondary | ICD-10-CM | POA: Diagnosis not present

## 2021-03-03 MED ORDER — FUROSEMIDE 10 MG/ML IJ SOLN
INTRAMUSCULAR | Status: AC
  Administered 2021-03-03: 40 mg
  Filled 2021-03-03: qty 4

## 2021-03-03 MED ORDER — POTASSIUM CHLORIDE CRYS ER 20 MEQ PO TBCR
EXTENDED_RELEASE_TABLET | ORAL | Status: AC
  Administered 2021-03-03: 20 meq via ORAL
  Filled 2021-03-03: qty 1

## 2021-03-03 MED ORDER — POTASSIUM CHLORIDE CRYS ER 20 MEQ PO TBCR: 20.0000 meq | EXTENDED_RELEASE_TABLET | Freq: Once | ORAL | Status: AC

## 2021-03-03 MED ORDER — FUROSEMIDE 10 MG/ML IJ SOLN: 40.0000 mg | Freq: Once | INTRAMUSCULAR | Status: AC

## 2021-03-05 ENCOUNTER — Ambulatory Visit
Admission: RE | Admit: 2021-03-05 | Discharge: 2021-03-05 | Disposition: A | Discharge status: Home / Self Care | Payer: Medicare PPO | Source: Ambulatory Visit | Attending: Nephrology | Admitting: Nephrology

## 2021-03-05 ENCOUNTER — Other Ambulatory Visit: Payer: Self-pay

## 2021-03-05 DIAGNOSIS — R609 Edema, unspecified: Secondary | ICD-10-CM | POA: Insufficient documentation

## 2021-03-05 LAB — BASIC METABOLIC PANEL
Anion gap: 8 (ref 5–15)
BUN: 25 mg/dL — ABNORMAL HIGH (ref 8–23)
CO2: 27 mmol/L (ref 22–32)
Calcium: 8.5 mg/dL — ABNORMAL LOW (ref 8.9–10.3)
Chloride: 102 mmol/L (ref 98–111)
Creatinine, Ser: 1.06 mg/dL (ref 0.61–1.24)
GFR, Estimated: >60 mL/min (ref >60)
Glucose, Bld: 85 mg/dL (ref 70–99)
Potassium: 4.5 mmol/L (ref 3.5–5.1)
Sodium: 137 mmol/L (ref 135–145)

## 2021-03-05 MED ORDER — POTASSIUM CHLORIDE CRYS ER 20 MEQ PO TBCR
EXTENDED_RELEASE_TABLET | ORAL | Status: AC
  Administered 2021-03-05: 20 meq via ORAL
  Filled 2021-03-05: qty 1

## 2021-03-05 MED ORDER — FUROSEMIDE 10 MG/ML IJ SOLN: 40.0000 mg | Freq: Once | INTRAMUSCULAR | Status: AC

## 2021-03-05 MED ORDER — POTASSIUM CHLORIDE CRYS ER 20 MEQ PO TBCR: 20.0000 meq | EXTENDED_RELEASE_TABLET | Freq: Once | ORAL | Status: AC

## 2021-03-05 MED ORDER — SODIUM CHLORIDE FLUSH 0.9 % IV SOLN
INTRAVENOUS | Status: AC
  Filled 2021-03-05: qty 10

## 2021-03-05 MED ORDER — FUROSEMIDE 10 MG/ML IJ SOLN
INTRAMUSCULAR | Status: AC
  Administered 2021-03-05: 40 mg via INTRAVENOUS
  Filled 2021-03-05: qty 8

## 2021-03-08 ENCOUNTER — Other Ambulatory Visit: Payer: Self-pay

## 2021-03-08 ENCOUNTER — Ambulatory Visit
Admission: RE | Admit: 2021-03-08 | Discharge: 2021-03-08 | Disposition: A | Discharge status: Home / Self Care | Payer: Medicare PPO | Source: Ambulatory Visit | Attending: Nephrology | Admitting: Nephrology

## 2021-03-08 DIAGNOSIS — R609 Edema, unspecified: Secondary | ICD-10-CM | POA: Diagnosis not present

## 2021-03-08 MED ORDER — POTASSIUM CHLORIDE CRYS ER 20 MEQ PO TBCR
EXTENDED_RELEASE_TABLET | ORAL | Status: AC
  Administered 2021-03-08: 20 meq via ORAL
  Filled 2021-03-08: qty 1

## 2021-03-08 MED ORDER — POTASSIUM CHLORIDE CRYS ER 20 MEQ PO TBCR: 20.0000 meq | EXTENDED_RELEASE_TABLET | Freq: Once | ORAL | Status: AC

## 2021-03-08 MED ORDER — FUROSEMIDE 10 MG/ML IJ SOLN
INTRAMUSCULAR | Status: AC
  Administered 2021-03-08: 40 mg via INTRAVENOUS
  Filled 2021-03-08: qty 4

## 2021-03-08 MED ORDER — FUROSEMIDE 10 MG/ML IJ SOLN: 40.0000 mg | Freq: Once | INTRAMUSCULAR | Status: AC

## 2021-03-10 ENCOUNTER — Ambulatory Visit
Admission: RE | Admit: 2021-03-10 | Discharge: 2021-03-10 | Disposition: A | Discharge status: Home / Self Care | Payer: Medicare PPO | Source: Ambulatory Visit | Attending: Nephrology | Admitting: Nephrology

## 2021-03-10 ENCOUNTER — Other Ambulatory Visit: Payer: Self-pay

## 2021-03-10 DIAGNOSIS — M5137 Other intervertebral disc degeneration, lumbosacral region: Secondary | ICD-10-CM | POA: Insufficient documentation

## 2021-03-10 DIAGNOSIS — M4316 Spondylolisthesis, lumbar region: Secondary | ICD-10-CM | POA: Diagnosis not present

## 2021-03-10 DIAGNOSIS — Z7901 Long term (current) use of anticoagulants: Secondary | ICD-10-CM | POA: Insufficient documentation

## 2021-03-10 DIAGNOSIS — M4807 Spinal stenosis, lumbosacral region: Secondary | ICD-10-CM | POA: Diagnosis not present

## 2021-03-10 DIAGNOSIS — G8929 Other chronic pain: Secondary | ICD-10-CM | POA: Diagnosis not present

## 2021-03-10 MED ORDER — FUROSEMIDE 10 MG/ML IJ SOLN: 40.0000 mg | Freq: Once | INTRAMUSCULAR | Status: AC

## 2021-03-10 MED ORDER — POTASSIUM CHLORIDE CRYS ER 20 MEQ PO TBCR: 20.0000 meq | EXTENDED_RELEASE_TABLET | Freq: Once | ORAL | Status: AC

## 2021-03-10 MED ORDER — POTASSIUM CHLORIDE CRYS ER 20 MEQ PO TBCR
EXTENDED_RELEASE_TABLET | ORAL | Status: AC
  Administered 2021-03-10: 20 meq via ORAL
  Filled 2021-03-10: qty 1

## 2021-03-10 MED ORDER — FUROSEMIDE 10 MG/ML IJ SOLN
INTRAMUSCULAR | Status: AC
  Administered 2021-03-10: 40 mg via INTRAVENOUS
  Filled 2021-03-10: qty 4

## 2021-03-11 ENCOUNTER — Ambulatory Visit (HOSPITAL_BASED_OUTPATIENT_CLINIC_OR_DEPARTMENT_OTHER): Payer: Medicare PPO | Admitting: Pain Medicine

## 2021-03-11 ENCOUNTER — Ambulatory Visit
Admission: RE | Admit: 2021-03-11 | Discharge: 2021-03-11 | Disposition: A | Discharge status: Home / Self Care | Payer: Medicare PPO | Source: Ambulatory Visit | Attending: Pain Medicine | Admitting: Pain Medicine

## 2021-03-11 ENCOUNTER — Encounter: Payer: Self-pay | Admitting: Pain Medicine

## 2021-03-11 VITALS — BP 126/58 | HR 60 | Temp 97.2°F | Resp 12 | Ht 69.0 in | Wt 212.0 lb

## 2021-03-11 DIAGNOSIS — M5136 Other intervertebral disc degeneration, lumbar region: Secondary | ICD-10-CM | POA: Diagnosis not present

## 2021-03-11 DIAGNOSIS — M4316 Spondylolisthesis, lumbar region: Secondary | ICD-10-CM | POA: Diagnosis not present

## 2021-03-11 DIAGNOSIS — Z7901 Long term (current) use of anticoagulants: Secondary | ICD-10-CM | POA: Diagnosis not present

## 2021-03-11 DIAGNOSIS — M545 Low back pain, unspecified: Secondary | ICD-10-CM

## 2021-03-11 DIAGNOSIS — G8929 Other chronic pain: Secondary | ICD-10-CM

## 2021-03-11 DIAGNOSIS — M4807 Spinal stenosis, lumbosacral region: Secondary | ICD-10-CM | POA: Diagnosis not present

## 2021-03-11 DIAGNOSIS — M5137 Other intervertebral disc degeneration, lumbosacral region: Secondary | ICD-10-CM | POA: Diagnosis not present

## 2021-03-11 DIAGNOSIS — M9963 Osseous and subluxation stenosis of intervertebral foramina of lumbar region: Secondary | ICD-10-CM

## 2021-03-11 DIAGNOSIS — R937 Abnormal findings on diagnostic imaging of other parts of musculoskeletal system: Secondary | ICD-10-CM

## 2021-03-11 DIAGNOSIS — M4310 Spondylolisthesis, site unspecified: Secondary | ICD-10-CM

## 2021-03-11 DIAGNOSIS — G894 Chronic pain syndrome: Secondary | ICD-10-CM

## 2021-03-11 MED ORDER — FENTANYL CITRATE (PF) 100 MCG/2ML IJ SOLN
25.0000 ug | INTRAMUSCULAR | Status: DC | PRN
Start: 2021-03-11 — End: 2021-03-11
  Filled 2021-03-11: qty 2

## 2021-03-11 MED ORDER — IOHEXOL 180 MG/ML  SOLN
10.0000 mL | Freq: Once | INTRAMUSCULAR | Status: DC
  Filled 2021-03-11: qty 20

## 2021-03-11 MED ORDER — SODIUM CHLORIDE 0.9% FLUSH: 2.0000 mL | Freq: Once | INTRAVENOUS | Status: DC

## 2021-03-11 MED ORDER — LACTATED RINGERS IV SOLN: 1000.0000 mL | Freq: Once | INTRAVENOUS | Status: DC

## 2021-03-11 MED ORDER — ROPIVACAINE HCL 2 MG/ML IJ SOLN
2.0000 mL | Freq: Once | INTRAMUSCULAR | Status: DC
  Filled 2021-03-11: qty 10

## 2021-03-11 MED ORDER — MIDAZOLAM HCL 5 MG/5ML IJ SOLN
1.0000 mg | INTRAMUSCULAR | Status: DC | PRN
Start: 2021-03-11 — End: 2021-03-11
  Filled 2021-03-11: qty 5

## 2021-03-11 MED ORDER — SODIUM CHLORIDE (PF) 0.9 % IJ SOLN
INTRAMUSCULAR | Status: AC
  Filled 2021-03-11: qty 10

## 2021-03-11 MED ORDER — LIDOCAINE HCL 2 % IJ SOLN
20.0000 mL | Freq: Once | INTRAMUSCULAR | Status: DC
  Filled 2021-03-11: qty 40

## 2021-03-11 MED ORDER — TRIAMCINOLONE ACETONIDE 40 MG/ML IJ SUSP
40.0000 mg | Freq: Once | INTRAMUSCULAR | Status: DC
  Filled 2021-03-11: qty 1

## 2021-03-11 NOTE — Patient Instructions (Signed)

## 2021-03-11 NOTE — Progress Notes (Signed)
PROVIDER NOTE: Information contained herein reflects review and annotations entered in association with encounter. Interpretation of such information and data should be left to medically-trained personnel. Information provided to patient can be located elsewhere in the medical record under "Patient Instructions". Document created using STT-dictation technology, any transcriptional errors that may result from process are unintentional.    Patient: [name]  Service Category: Procedure  Provider: Gaspar Cola, MD  DOB: 06/02/1934  DOS: 03/11/2021  Location: [facility]  MRN: [id number]  Setting: Ambulatory - outpatient  Referring Provider: Milinda Pointer, MD  Type: Established Patient  Specialty: Interventional Pain Management  PCP: [name], MD   Primary Reason for Visit: Interventional Pain Management Treatment. CC: Back Pain (lower)  Procedure:          Anesthesia, Analgesia, Anxiolysis:  Type: Therapeutic Inter-Laminar Epidural Steroid Injection  #1  Region: Lumbar Level: L2-3 Level. Laterality: Midline Paramedial  Type: Local Anesthesia Indication(s): Analgesia         Route: Infiltration (Tabor/IM) IV Access: Declined Sedation: Declined  Local Anesthetic: Lidocaine 1-2%  Position: Prone with head of the table was raised to facilitate breathing.   Indications: 1. DDD (degenerative disc disease), lumbosacral   2. Grade 1 Retrolisthesis at L2-3 and L3-4   3. Lumbosacral foraminal stenosis   4. Chronic low back pain (Bilateral) w/o sciatica   5. Abnormal MRI, lumbar spine (09/21/2020)   6. Chronic anticoagulation (Eliquis)    Pain Score: Pre-procedure: 7 /10 Post-procedure: 0-No pain/10   Pre-op H&P Assessment:  [name] is a 87 y.o. (year old), male patient, seen today for interventional treatment. He  has a past surgical history that includes Prostate surgery; Nasal sinus surgery (2009); Urethra dilation ([id number]); Total knee arthroplasty  (Left, 9/15); Knee Arthroplasty (Right, 09/02/2015); permanent indwelling catheter; Cardioversion (N/A, 04/23/2019); and Laparoscopic nephrectomy, hand assisted (Right, 06/17/2019). [name] has a current medication list which includes the following prescription(s): acetaminophen, aloe, alprazolam, ascorbic acid, calcium-magnesium-zinc, chlorhexidine, vitamin d3, co q 10, cranberry, eliquis, epinephrine, fluticasone, folic acid, furosemide, gabapentin, hydroxychloroquine, ketoconazole, ketoconazole, methotrexate, milk thistle, multiple vitamins-minerals, myrbetriq, fish oil, omeprazole, choline, polyethylene glycol powder, pravastatin, align, selenium, triamcinolone ointment, turmeric, vitamin b-12, zinc, alendronate, erythromycin, metoprolol tartrate, multivitamin, and sodium chloride, and the following Facility-Administered Medications: iohexol, lidocaine, ropivacaine (pf) 2 mg/ml (0.2%), sodium chloride flush, and triamcinolone acetonide. His primarily concern today is the Back Pain (lower)  Initial Vital Signs:  Pulse/HCG Rate: 60ECG Heart Rate: 69 Temp: (!) 97.2 F (36.2 C) Resp: 16 BP: (!) 105/54 SpO2: 97 %  BMI: Estimated body mass index is 31.31 kg/m as calculated from the following:   Height as of this encounter: 5\' 9"  (1.753 m).   Weight as of this encounter: 212 lb (96.2 kg).  Risk Assessment: Allergies: Reviewed. He is allergic to ciprofloxacin, citalopram hydrobromide, clindamycin, lorazepam, paroxetine, ramipril, simvastatin, and sulfa antibiotics.  Allergy Precautions: None required Coagulopathies: Reviewed. None identified.  Blood-thinner therapy: None at this time Active Infection(s): Reviewed. None identified. [name] is afebrile  Site Confirmation: [name] was asked to confirm the procedure and laterality before marking the site Procedure checklist: Completed Consent: Before the procedure and under the influence of no sedative(s), amnesic(s), or anxiolytics, the patient  was informed of the treatment options, risks and possible complications. To fulfill our ethical and legal obligations, as recommended by the American Medical Association's Code of Ethics, I have informed the patient of my clinical impression; the nature and purpose of the treatment  or procedure; the risks, benefits, and possible complications of the intervention; the alternatives, including doing nothing; the risk(s) and benefit(s) of the alternative treatment(s) or procedure(s); and the risk(s) and benefit(s) of doing nothing. The patient was provided information about the general risks and possible complications associated with the procedure. These may include, but are not limited to: failure to achieve desired goals, infection, bleeding, organ or nerve damage, allergic reactions, paralysis, and death. In addition, the patient was informed of those risks and complications associated to Spine-related procedures, such as failure to decrease pain; infection (i.e.: Meningitis, epidural or intraspinal abscess); bleeding (i.e.: epidural hematoma, subarachnoid hemorrhage, or any other type of intraspinal or peri-dural bleeding); organ or nerve damage (i.e.: Any type of peripheral nerve, nerve root, or spinal cord injury) with subsequent damage to sensory, motor, and/or autonomic systems, resulting in permanent pain, numbness, and/or weakness of one or several areas of the body; allergic reactions; (i.e.: anaphylactic reaction); and/or death. Furthermore, the patient was informed of those risks and complications associated with the medications. These include, but are not limited to: allergic reactions (i.e.: anaphylactic or anaphylactoid reaction(s)); adrenal axis suppression; blood sugar elevation that in diabetics may result in ketoacidosis or comma; water retention that in patients with history of congestive heart failure may result in shortness of breath, pulmonary edema, and decompensation with resultant heart  failure; weight gain; swelling or edema; medication-induced neural toxicity; particulate matter embolism and blood vessel occlusion with resultant organ, and/or nervous system infarction; and/or aseptic necrosis of one or more joints. Finally, the patient was informed that Medicine is not an exact science; therefore, there is also the possibility of unforeseen or unpredictable risks and/or possible complications that may result in a catastrophic outcome. The patient indicated having understood very clearly. We have given the patient no guarantees and we have made no promises. Enough time was given to the patient to ask questions, all of which were answered to the patient's satisfaction. Mr. Riggi has indicated that he wanted to continue with the procedure. Attestation: I, the ordering provider, attest that I have discussed with the patient the benefits, risks, side-effects, alternatives, likelihood of achieving goals, and potential problems during recovery for the procedure that I have provided informed consent. Date  Time: 03/11/2021 11:25 AM  Pre-Procedure Preparation:  Monitoring: As per clinic protocol. Respiration, ETCO2, SpO2, BP, heart rate and rhythm monitor placed and checked for adequate function Safety Precautions: Patient was assessed for positional comfort and pressure points before starting the procedure. Time-out: I initiated and conducted the "Time-out" before starting the procedure, as per protocol. The patient was asked to participate by confirming the accuracy of the "Time Out" information. Verification of the correct person, site, and procedure were performed and confirmed by me, the nursing staff, and the patient. "Time-out" conducted as per Joint Commission's Universal Protocol (UP.01.01.01). Time: 1151  Description of Procedure:          Target Area: The interlaminar space, initially targeting the lower laminar border of the superior vertebral body. Approach: Paramedial  approach. Area Prepped: Entire Posterior Lumbar Region DuraPrep (Iodine Povacrylex [0.7% available iodine] and Isopropyl Alcohol, 74% w/w) Safety Precautions: Aspiration looking for blood return was conducted prior to all injections. At no point did we inject any substances, as a needle was being advanced. No attempts were made at seeking any paresthesias. Safe injection practices and needle disposal techniques used. Medications properly checked for expiration dates. SDV (single dose vial) medications used. Description of the Procedure: Protocol guidelines were  followed. The procedure needle was introduced through the skin, ipsilateral to the reported pain, and advanced to the target area. Bone was contacted and the needle walked caudad, until the lamina was cleared. The epidural space was identified using "loss-of-resistance technique" with 2-3 ml of PF-NaCl (0.9% NSS), in a 5cc LOR glass syringe.  Vitals:   03/11/21 1124 03/11/21 1150 03/11/21 1155 03/11/21 1158  BP: (!) 105/54 134/61 (!) 128/57 (!) 126/58  Pulse: 60     Resp: 16 12 20 12   Temp: (!) 97.2 F (36.2 C)     TempSrc: Temporal     SpO2: 97% 97% 95% 96%  Weight: 212 lb (96.2 kg)     Height: 5\' 9"  (1.753 m)       Start Time: 1151 hrs. End Time: 1157 hrs.  Materials:  Needle(s) Type: Epidural needle Gauge: 17G Length: 3.5-in Medication(s): Please see orders for medications and dosing details.  Imaging Guidance (Spinal):          Type of Imaging Technique: Fluoroscopy Guidance (Spinal) Indication(s): Assistance in needle guidance and placement for procedures requiring needle placement in or near specific anatomical locations not easily accessible without such assistance. Exposure Time: Please see nurses notes. Contrast: Before injecting any contrast, we confirmed that the patient did not have an allergy to iodine, shellfish, or radiological contrast. Once satisfactory needle placement was completed at the desired level,  radiological contrast was injected. Contrast injected under live fluoroscopy. No contrast complications. See chart for type and volume of contrast used. Fluoroscopic Guidance: I was personally present during the use of fluoroscopy. "Tunnel Vision Technique" used to obtain the best possible view of the target area. Parallax error corrected before commencing the procedure. "Direction-depth-direction" technique used to introduce the needle under continuous pulsed fluoroscopy. Once target was reached, antero-posterior, oblique, and lateral fluoroscopic projection used confirm needle placement in all planes. Images permanently stored in EMR. Interpretation: I personally interpreted the imaging intraoperatively. Adequate needle placement confirmed in multiple planes. Appropriate spread of contrast into desired area was observed. No evidence of afferent or efferent intravascular uptake. No intrathecal or subarachnoid spread observed. Permanent images saved into the patient's record.  Antibiotic Prophylaxis:   Anti-infectives (From admission, onward)   None     Indication(s): None identified  Post-operative Assessment:  Post-procedure Vital Signs:  Pulse/HCG Rate: 6069 Temp: (!) 97.2 F (36.2 C) Resp: 12 BP: (!) 126/58 SpO2: 96 %  EBL: None  Complications: No immediate post-treatment complications observed by team, or reported by patient.  Note: The patient tolerated the entire procedure well. A repeat set of vitals were taken after the procedure and the patient was kept under observation following institutional policy, for this type of procedure. Post-procedural neurological assessment was performed, showing return to baseline, prior to discharge. The patient was provided with post-procedure discharge instructions, including a section on how to identify potential problems. Should any problems arise concerning this procedure, the patient was given instructions to immediately contact us, at any time,  without hesitation. In any case, we plan to contact the patient by telephone for a follow-up status report regarding this interventional procedure.  Comments:  No additional relevant information.  Plan of Care  Orders:  Orders Placed This Encounter  Procedures  . Lumbar Epidural Injection    Scheduling Instructions:     Procedure: Interlaminar LESI L2-3     Laterality: Midline     Sedation: Patient's choice     Timeframe:  Today    Order Specific Question:  Where will this procedure be performed?    Answer:   [facility]  . [facility] C-ARM 1-60 MIN NO REPORT    Intraoperative interpretation by procedural physician at [place].    Standing Status:   Standing    Number of Occurrences:   1    Order Specific Question:   Reason for exam:    Answer:   Assistance in needle guidance and placement for procedures requiring needle placement in or near specific anatomical locations not easily accessible without such assistance.  . Informed Consent Details: Physician/Practitioner Attestation; Transcribe to consent form and obtain patient signature    Note: Always confirm laterality of pain with Mr. Jablon, before procedure. Transcribe to consent form and obtain patient signature.    Order Specific Question:   Physician/Practitioner attestation of informed consent for procedure/surgical case    Answer:   I, the physician/practitioner, attest that I have discussed with the patient the benefits, risks, side effects, alternatives, likelihood of achieving goals and potential problems during recovery for the procedure that I have provided informed consent.    Order Specific Question:   Procedure    Answer:   Lumbar epidural steroid injection under fluoroscopic guidance    Order Specific Question:   Physician/Practitioner performing the procedure    Answer:   Sheretta Grumbine A. Dossie Arbour, MD    Order Specific Question:   Indication/Reason    Answer:   Low back and/or lower extremity  pain secondary to lumbar radiculitis  . Care order/instruction: Please confirm that the patient has stopped the Eliquis (Apixaban) x 3 days prior to procedure or surgery.    Please confirm that the patient has stopped the Eliquis (Apixaban) x 3 days prior to procedure or surgery.    Standing Status:   Standing    Number of Occurrences:   1  . Provide equipment / supplies at bedside    "Epidural Tray" (Disposable  single use) Catheter: NOT required    Standing Status:   Standing    Number of Occurrences:   1    Order Specific Question:   Specify    Answer:   Epidural Tray  . Bleeding precautions    Standing Status:   Standing    Number of Occurrences:   1   Chronic Opioid Analgesic:  None MME/day: 0 mg/day   Medications ordered for procedure: Meds ordered this encounter  Medications  . iohexol (OMNIPAQUE) 180 MG/ML injection 10 mL    Must be Myelogram-compatible. If not available, you may substitute with a water-soluble, non-ionic, hypoallergenic, myelogram-compatible radiological contrast medium.  Marland Kitchen lidocaine (XYLOCAINE) 2 % (with pres) injection 400 mg  . DISCONTD: lactated ringers infusion 1,000 mL  . DISCONTD: midazolam (VERSED) 5 MG/5ML injection 1-2 mg    Make sure Flumazenil is available in the pyxis when using this medication. If oversedation occurs, administer 0.2 mg IV over 15 sec. If after 45 sec no response, administer 0.2 mg again over 1 min; may repeat at 1 min intervals; not to exceed 4 doses (1 mg)  . DISCONTD: fentaNYL (SUBLIMAZE) injection 25-50 mcg    Make sure Narcan is available in the pyxis when using this medication. In the event of respiratory depression (RR< 8/min): Titrate NARCAN (naloxone) in increments of 0.1 to 0.2 mg IV at 2-3 minute intervals, until desired degree of reversal.  . sodium chloride flush (NS) 0.9 % injection 2 mL  . ropivacaine (PF) 2 mg/mL (0.2%) (NAROPIN) injection 2 mL  .  triamcinolone acetonide (KENALOG-40) injection 40 mg    Medications administered: [name] had no medications administered during this visit.  See the medical record for exact dosing, route, and time of administration.  Follow-up plan:   Return for on afternoon of procedure day, (VV), (PPE).       Interventional Therapies  Risk  Complexity Considerations:   Eliquis anticoagulation (Stop: 3 days  Restart: 6 hours)  Plaquenil anticoagulation (Stop: 11 days)  "Fast-Track" Referral (11/04/2020) Advanced age  Prior patient of Dr. Sharlet Salina    Planned  Pending:   Diagnostic midline L2-3 LESI #1    Under consideration:   Diagnostic bilateral lumbar facet block #2  Diagnostic midline L2-3 LESI #1  Diagnostic interspinous process ligament injections (R/o kissing spine syndrome)   Completed:   Diagnostic bilateral lumbar facet MBB x1 (11/24/2020) (50% for the duration of the local anesthetic)   Palliative options:   None established     Recent Visits Date Type Provider Dept  02/15/21 Telemedicine Milinda Pointer, [facility]  12/22/20 Office Visit Milinda Pointer, MD [facility]  Showing recent visits within past 90 days and meeting all other requirements Today's Visits Date Type Provider Dept  03/11/21 Procedure visit Milinda Pointer, MD [facility]  Showing today's visits and meeting all other requirements Future Appointments No visits were found meeting these conditions. Showing future appointments within next 90 days and meeting all other requirements  Disposition: Discharge home  Discharge (Date  Time): 03/11/2021; 1204 hrs.   Primary Care Physician: [name], MD Location: [facility] Note by: Gaspar Cola, MD Date: 03/11/2021; Time: 12:11 PM  Disclaimer:  Medicine is not an Chief Strategy Officer. The only guarantee in medicine is that nothing is guaranteed. It is important to note that the decision to proceed with this  intervention was based on the information collected from the patient. The Data and conclusions were drawn from the patient's questionnaire, the interview, and the physical examination. Because the information was provided in large part by the patient, it cannot be guaranteed that it has not been purposely or unconsciously manipulated. Every effort has been made to obtain as much relevant data as possible for this evaluation. It is important to note that the conclusions that lead to this procedure are derived in large part from the available data. Always take into account that the treatment will also be dependent on availability of resources and existing treatment guidelines, considered by other Pain Management Practitioners as being common knowledge and practice, at the time of the intervention. For Medico-Legal purposes, it is also important to point out that variation in procedural techniques and pharmacological choices are the acceptable norm. The indications, contraindications, technique, and results of the above procedure should only be interpreted and judged by a Board-Certified Interventional Pain Specialist with extensive familiarity and expertise in the same exact procedure and technique.

## 2021-03-11 NOTE — Progress Notes (Signed)
Safety precautions to be maintained throughout the outpatient stay will include: orient to surroundings, keep bed in low position, maintain call bell within reach at all times, provide assistance with transfer out of bed and ambulation.  

## 2021-03-12 ENCOUNTER — Ambulatory Visit
Admission: RE | Admit: 2021-03-12 | Discharge: 2021-03-12 | Disposition: A | Discharge status: Home / Self Care | Payer: Medicare PPO | Source: Ambulatory Visit | Attending: Nephrology | Admitting: Nephrology

## 2021-03-12 ENCOUNTER — Other Ambulatory Visit: Payer: Self-pay

## 2021-03-12 ENCOUNTER — Telehealth: Payer: Self-pay | Admitting: *Deleted

## 2021-03-12 DIAGNOSIS — R609 Edema, unspecified: Secondary | ICD-10-CM | POA: Diagnosis not present

## 2021-03-12 LAB — BASIC METABOLIC PANEL
Anion gap: 9 (ref 5–15)
BUN: 27 mg/dL — ABNORMAL HIGH (ref 8–23)
CO2: 26 mmol/L (ref 22–32)
Calcium: 9 mg/dL (ref 8.9–10.3)
Chloride: 99 mmol/L (ref 98–111)
Creatinine, Ser: 0.95 mg/dL (ref 0.61–1.24)
GFR, Estimated: >60 mL/min (ref >60)
Glucose, Bld: 113 mg/dL — ABNORMAL HIGH (ref 70–99)
Potassium: 4.6 mmol/L (ref 3.5–5.1)
Sodium: 134 mmol/L — ABNORMAL LOW (ref 135–145)

## 2021-03-12 MED ORDER — FUROSEMIDE 10 MG/ML IJ SOLN: 40.0000 mg | Freq: Once | INTRAMUSCULAR | Status: AC

## 2021-03-12 MED ORDER — POTASSIUM CHLORIDE CRYS ER 20 MEQ PO TBCR
EXTENDED_RELEASE_TABLET | ORAL | Status: AC
  Administered 2021-03-12: 20 meq via ORAL
  Filled 2021-03-12: qty 1

## 2021-03-12 MED ORDER — FUROSEMIDE 10 MG/ML IJ SOLN
INTRAMUSCULAR | Status: AC
  Administered 2021-03-12: 40 mg via INTRAVENOUS
  Filled 2021-03-12: qty 4

## 2021-03-12 MED ORDER — POTASSIUM CHLORIDE CRYS ER 20 MEQ PO TBCR: 20.0000 meq | EXTENDED_RELEASE_TABLET | Freq: Once | ORAL | Status: AC

## 2021-03-12 NOTE — Telephone Encounter (Signed)
Spoke with daughter, no problems post procedure.

## 2021-03-13 ENCOUNTER — Other Ambulatory Visit: Payer: Self-pay | Admitting: Internal Medicine

## 2021-03-15 NOTE — Telephone Encounter (Signed)
Last filled2-24-22#90 Last OV 05-11-20 No Future OV [facility] [address] [employer] [address]

## 2021-03-16 ENCOUNTER — Other Ambulatory Visit: Payer: Self-pay

## 2021-03-16 ENCOUNTER — Ambulatory Visit (INDEPENDENT_AMBULATORY_CARE_PROVIDER_SITE_OTHER): Payer: Medicare PPO

## 2021-03-16 VITALS — BP 145/76 | HR 59 | Temp 98.5°F

## 2021-03-16 DIAGNOSIS — J4550 Severe persistent asthma, uncomplicated: Secondary | ICD-10-CM | POA: Diagnosis not present

## 2021-03-16 DIAGNOSIS — J4540 Moderate persistent asthma, uncomplicated: Secondary | ICD-10-CM

## 2021-03-16 MED ORDER — OMALIZUMAB 75 MG/0.5ML ~~LOC~~ SOSY
375.0000 mg | PREFILLED_SYRINGE | Freq: Once | SUBCUTANEOUS | Status: AC
  Administered 2021-03-16: 375 mg via SUBCUTANEOUS

## 2021-03-16 MED ORDER — METHYLPREDNISOLONE SODIUM SUCC 125 MG IJ SOLR: 125.0000 mg | Freq: Once | INTRAMUSCULAR | Status: DC | PRN

## 2021-03-16 MED ORDER — FAMOTIDINE IN NACL 20-0.9 MG/50ML-% IV SOLN: 20.0000 mg | Freq: Once | INTRAVENOUS | Status: DC | PRN

## 2021-03-16 MED ORDER — ALBUTEROL SULFATE HFA 108 (90 BASE) MCG/ACT IN AERS: 2.0000 | INHALATION_SPRAY | Freq: Once | RESPIRATORY_TRACT | Status: DC | PRN

## 2021-03-16 MED ORDER — EPINEPHRINE 0.3 MG/0.3ML IJ SOAJ: 0.3000 mg | Freq: Once | INTRAMUSCULAR | Status: DC | PRN

## 2021-03-16 MED ORDER — DIPHENHYDRAMINE HCL 50 MG/ML IJ SOLN: 50.0000 mg | Freq: Once | INTRAMUSCULAR | Status: DC | PRN

## 2021-03-16 MED ORDER — SODIUM CHLORIDE 0.9 % IV SOLN: Freq: Once | INTRAVENOUS | Status: DC | PRN

## 2021-03-16 NOTE — Progress Notes (Signed)
Diagnosis: Asthma  Provider:  Marshell Garfinkel, MD  Procedure: Injection  Xolair (Omalizumab), Dose: 375 mg, Site: subcutaneous  Discharge: Condition: Good, Destination: Home . AVS provided to patient.   Performed by:  Arnoldo Morale, RN

## 2021-03-18 ENCOUNTER — Other Ambulatory Visit: Payer: Self-pay

## 2021-03-18 ENCOUNTER — Ambulatory Visit (INDEPENDENT_AMBULATORY_CARE_PROVIDER_SITE_OTHER): Payer: Medicare PPO | Admitting: Physician Assistant

## 2021-03-18 DIAGNOSIS — N319 Neuromuscular dysfunction of bladder, unspecified: Secondary | ICD-10-CM

## 2021-03-18 NOTE — Progress Notes (Signed)
Cath Change/ Replacement  Patient is present today for a catheter change due to urinary retention.  35ml of water was removed from the balloon, a 16FR foley cath was removed without difficulty.  Patient was cleaned and prepped in a sterile fashion with betadine and 2% lidocaine jelly was instilled into the urethra. A 16 FR foley cath was replaced into the bladder no complications were noted Urine return was noted 52ml and urine was pink tinged in color. The balloon was filled with 75ml of sterile water. A leg bag was attached for drainage.     Performed by: Debroah Loop, PA-C   Follow up: Return in about 4 weeks (around 04/15/2021) for Catheter exchange.

## 2021-03-22 DIAGNOSIS — D631 Anemia in chronic kidney disease: Secondary | ICD-10-CM | POA: Diagnosis not present

## 2021-03-22 DIAGNOSIS — R600 Localized edema: Secondary | ICD-10-CM | POA: Diagnosis not present

## 2021-03-22 DIAGNOSIS — N182 Chronic kidney disease, stage 2 (mild): Secondary | ICD-10-CM | POA: Diagnosis not present

## 2021-03-22 DIAGNOSIS — E871 Hypo-osmolality and hyponatremia: Secondary | ICD-10-CM | POA: Diagnosis not present

## 2021-03-28 NOTE — Progress Notes (Signed)
Patient: [name]  Service Category: E/M  Provider: Gaspar Cola, MD  DOB: December 04, 1933  DOS: 03/29/2021  Location: Office  MRN: [id number]  Setting: Ambulatory outpatient  Referring Provider: [name], MD  Type: Established Patient  Specialty: Interventional Pain Management  PCP: Willie Carbon, MD  Location: Remote location  Delivery: TeleHealth     Virtual Encounter - Pain Management PROVIDER NOTE: Information contained herein reflects review and annotations entered in association with encounter. Interpretation of such information and data should be left to medically-trained personnel. Information provided to patient can be located elsewhere in the medical record under "Patient Instructions". Document created using STT-dictation technology, any transcriptional errors that may result from process are unintentional.    Contact & Pharmacy Preferred: [phone] Home: [phone] (home) Mobile: [phone] (mobile) E-mail: [email or web address]  [id number] Lorina Rabon, [place] [facility] [place] [address] [place] [address] Phone: [phone] Fax: [phone]   Pre-screening  [name] offered "in-person" vs "virtual" encounter. He indicated preferring virtual for this encounter.   Reason COVID-19*  Social distancing based on CDC and AMA recommendations.   I contacted [name] on 03/29/2021 via telephone.      I clearly identified myself as Willie Cola, MD. I verified that I was speaking with the correct person using two identifiers (Name: [name], and date of birth: 1934-10-14).  Consent I sought verbal advanced consent from [name] for virtual visit interactions. I informed [name] of possible security and privacy concerns, risks, and limitations associated with providing "not-in-person" medical evaluation and management services. I also informed [name] of the availability of  "in-person" appointments. Finally, I informed him that there would be a charge for the virtual visit and that he could be  personally, fully or partially, financially responsible for it. [name] expressed understanding and agreed to proceed.   Historic Elements   [name] is a 87 y.o. year old, male patient evaluated today after our last contact on 03/11/2021. [name]  has a past medical history of (HFpEF) heart failure with preserved ejection fraction ([place]), Allergy, Anemia, Anxiety, Arthritis, Asthma, Basal cell carcinoma, CAD (coronary artery disease), CHF (congestive heart failure) ([place]), Chronic kidney disease, Chronic sinusitis, Collagen vascular disease ([place]), Depression, Diverticulitis, Dysrhythmia, ED (erectile dysfunction), GERD (gastroesophageal reflux disease), History of SIADH, adenomatous colonic polyps, Hypertension, Hyponatremia, IBS (irritable bowel syndrome), Neurodermatitis, Neurogenic bladder, OSA (obstructive sleep apnea), Persistent atrial fibrillation ([place]), Prostate cancer ([place]), and Renal mass. He also  has a past surgical history that includes Prostate surgery; Nasal sinus surgery (2009); Urethra dilation (01-2670); Total knee arthroplasty (Left, 9/15); Knee Arthroplasty (Right, 09/02/2015); permanent indwelling catheter; Cardioversion (N/A, 04/23/2019); and Laparoscopic nephrectomy, hand assisted (Right, 06/17/2019). [name] has a current medication list which includes the following prescription(s): acetaminophen, aloe, alprazolam, ascorbic acid, calcium-magnesium-zinc, chlorhexidine, vitamin d3, co q 10, cranberry, eliquis, epinephrine, erythromycin, fluticasone, folic acid, furosemide, gabapentin, hydroxychloroquine, ketoconazole, ketoconazole, methotrexate, milk thistle, multiple vitamins-minerals, myrbetriq, fish oil, omeprazole, choline, polyethylene glycol powder, pravastatin, align, selenium, triamcinolone ointment, turmeric, vitamin b-12, zinc, and metoprolol tartrate.  He  reports that he has never smoked. He has never used smokeless tobacco. He reports current alcohol use. He reports that he does not use drugs. [name] is allergic to ciprofloxacin, citalopram hydrobromide, clindamycin, lorazepam, paroxetine, ramipril, simvastatin, and sulfa antibiotics.   HPI  Today, he is being contacted for a  post-procedure assessment.  Patient did rather well with the midline L2-3 LESI #1.  He indicates that he attained better relief, range of motion, and ability to climb stairs, when compared to the diagnostic lumbar facet block.  He is currently doing okay and therefore nothing else is needed at this point however, I have offered the patient the ability to have a second midline L2-3 LESI PRN.  Post-Procedure Evaluation  Procedure (03/11/2021): Diagnostic/therapeutic midline L2-3 LESI #1 under fluoroscopic guidance, no sedation Pre-procedure pain level: 7/10 Post-procedure: 0/10 (100% relief)  Sedation: None.  Effectiveness during initial hour after procedure(Ultra-Short Term Relief): 100 %.  Local anesthetic used: Long-acting (4-6 hours) Effectiveness: Defined as any analgesic benefit obtained secondary to the administration of local anesthetics. This carries significant diagnostic value as to the etiological location, or anatomical origin, of the pain. Duration of benefit is expected to coincide with the duration of the local anesthetic used.  Effectiveness during initial 4-6 hours after procedure(Short-Term Relief): 100 %.  Long-term benefit: Defined as any relief past the pharmacologic duration of the local anesthetics.  Effectiveness past the initial 6 hours after procedure(Long-Term Relief): 60 % (the injection seemed to help other areas but not the targeted area as much.  activities were improved as far as getting, up, walking downstairs etc.).  Current benefits: Defined as benefit that persist at this time.   Analgesia:  The patient still enjoying a 60%  improvement in his low back pain.  He refers that this worked a lot better than the lumbar facet block. Function: [name] reports improvement in function.  He refers that he is now able to climb stairs much easier than before. ROM: Mr. Barfield reports improvement in ROM  Pharmacotherapy Assessment  Analgesic: None MME/day: 0 mg/day   Monitoring: [place] PMP: PDMP reviewed during this encounter.       Pharmacotherapy: No side-effects or adverse reactions reported. Compliance: No problems identified. Effectiveness: Clinically acceptable. Plan: Refer to "POC".  UDS:  Summary  Date Value Ref Range Status  11/04/2020 Note  Final    Comment:    ==================================================================== Compliance Drug Analysis, Ur ==================================================================== Specimen Alert Note: Urinary creatinine is low; ability to detect some drugs may be compromised. Interpret results with caution. (Creatinine) ==================================================================== Test                             Result       Flag       Units  Drug Present and Declared for Prescription Verification   Alprazolam                     500          EXPECTED   ng/mg creat   Alpha-hydroxyalprazolam        1265         EXPECTED   ng/mg creat    Source of alprazolam is a scheduled prescription medication. Alpha-    hydroxyalprazolam is an expected metabolite of alprazolam.    Gabapentin                     PRESENT      EXPECTED   Acetaminophen                  PRESENT      EXPECTED   Metoprolol  PRESENT      EXPECTED ==================================================================== Test                      Result    Flag   Units      Ref Range   Creatinine              17        LL     mg/dL      >=20 ==================================================================== Declared Medications:  The flagging and interpretation on this  report are based on the  following declared medications.  Unexpected results may arise from  inaccuracies in the declared medications.   **Note: The testing scope of this panel includes these medications:   Alprazolam (Xanax)  Gabapentin (Neurontin)  Metoprolol (Lopressor)   **Note: The testing scope of this panel does not include small to  moderate amounts of these reported medications:   Acetaminophen (Tylenol)   **Note: The testing scope of this panel does not include the  following reported medications:   Alendronate (Fosamax)  Apixaban (Eliquis)  Bifidobacterium infantis (Align)  Calcium  Chlorhexidine (Peridex)  Choline  Cyanocobalamin  Epinephrine (EpiPen)  Fish Oil  Fluticasone (Flonase)  Folic Acid  Furosemide (Lasix)  Hydroxychloroquine (Plaquenil)  Ketoconazole (Nizoral)  Magnesium  Methotrexate  Mirabegron (Myrbetriq)  Multivitamin  Omeprazole (Prilosec)  Polyethylene Glycol (MiraLAX)  Pravastatin (Pravachol)  Selenium  Sodium Chloride  Supplement  Triamcinolone (Kenalog)  Turmeric  Ubiquinone (CoQ10)  Vitamin D3  Zinc ==================================================================== For clinical consultation, please call [phone]. ====================================================================     Laboratory Chemistry Profile   Renal Lab Results  Component Value Date   BUN 27 (H) 03/12/2021   CREATININE 0.95 03/12/2021   GFR 72.02 05/11/2020   GFRAA >60 06/22/2019   GFRNONAA >60 03/12/2021     Hepatic Lab Results  Component Value Date   AST 22 11/05/2020   ALT 17 11/05/2020   ALBUMIN 3.4 (L) 11/05/2020   ALKPHOS 51 11/05/2020   LIPASE 50 10/28/2018     Electrolytes Lab Results  Component Value Date   NA 134 (L) 03/12/2021   K 4.6 03/12/2021   CL 99 03/12/2021   CALCIUM 9.0 03/12/2021   MG 2.1 11/05/2020   PHOS 3.8 05/11/2020     Bone Lab Results  Component Value Date   25OHVITD1 45 11/05/2020    25OHVITD2 <1.0 11/05/2020   25OHVITD3 45 11/05/2020     Inflammation (CRP: Acute Phase) (ESR: Chronic Phase) Lab Results  Component Value Date   CRP 1.5 (H) 11/05/2020   ESRSEDRATE 46 (H) 11/05/2020   LATICACIDVEN 1.5 10/18/2017       Note: Above Lab results reviewed.  Imaging  DG PAIN CLINIC C-ARM 1-60 MIN NO REPORT Fluoro was used, but no Radiologist interpretation will be provided.  Please refer to "NOTES" tab for provider progress note.  Assessment  The primary encounter diagnosis was Chronic low back pain (Bilateral) w/o sciatica. Diagnoses of Grade 1 Retrolisthesis at L2-3 and L3-4, Lumbosacral foraminal stenosis, Lumbar facet syndrome, Chronic pain syndrome, and Chronic anticoagulation (Eliquis) were also pertinent to this visit.  Plan of Care  Problem-specific:  No problem-specific Assessment & Plan notes found for this encounter.  Mr. DREON PINEDA has a current medication list which includes the following long-term medication(s): calcium-magnesium-zinc, eliquis, fluticasone, furosemide, gabapentin, myrbetriq, omeprazole, pravastatin, and metoprolol tartrate.  Pharmacotherapy (Medications Ordered): No orders of the defined types were placed in this encounter.  Orders:  Orders Placed This Encounter  Procedures  . Lumbar Epidural Injection    Standing Status:   Standing    Number of Occurrences:   2    Standing Expiration Date:   09/29/2021    Scheduling Instructions:     Procedure: Interlaminar Lumbar Epidural Steroid injection (LESI)  L2-3     Laterality: Midline     Sedation: Patient's choice.     Timeframe: Patient will call    Order Specific Question:   Where will this procedure be performed?    Answer:   [facility]  . Blood Thinner Instructions to Nursing    If the patient requires a Lovenox-bridge therapy, make sure arrangements are made to institute it with the assistance of the PCP.    Standing Status:   Standing    Number of Occurrences:   36     Standing Expiration Date:   03/29/2022    Scheduling Instructions:     Always stop the Eliquis (Apixaban) x 3 days prior to procedure or surgery.   Follow-up plan:   Return if symptoms worsen or fail to improve, for PRN Procedure (w/ sedation):  (ML) L2-3 LESI #2, (Blood Thinner Protocol).      Interventional Therapies  Risk  Complexity Considerations:   Eliquis anticoagulation (Stop: 3 days  Restart: 6 hours)  Plaquenil anticoagulation (Stop: 11 days)  "Fast-Track" Referral (11/04/2020) Advanced age  Prior patient of Dr. Sharlet Salina    Planned  Pending:   Diagnostic midline L2-3 LESI #1    Under consideration:   Diagnostic bilateral lumbar facet block #2  Diagnostic midline L2-3 LESI #1  Diagnostic interspinous process ligament injections (R/o kissing spine syndrome)   Completed:   Diagnostic bilateral lumbar facet MBB x1 (11/24/2020) (50% for the duration of the local anesthetic) Diagnostic/therapeutic midline L2-3 LESI x1 (03/11/2021) (100/100/60/60)    Palliative options:   Therapeutic midline L2-3 LESI #2       Recent Visits Date Type Provider Dept  03/11/21 Procedure visit Milinda Pointer, MD [facility]  02/15/21 Telemedicine Milinda Pointer, MD [facility]  Showing recent visits within past 90 days and meeting all other requirements Today's Visits Date Type Provider Dept  03/29/21 Telemedicine Milinda Pointer, MD [facility]  Showing today's visits and meeting all other requirements Future Appointments No visits were found meeting these conditions. Showing future appointments within next 90 days and meeting all other requirements  I discussed the assessment and treatment plan with the patient. The patient was provided an opportunity to ask questions and all were answered. The patient agreed with the plan and demonstrated an understanding of the instructions.  Patient advised to call back or seek an in-person  evaluation if the symptoms or condition worsens.  Duration of encounter: 15 minutes.  Note by: Willie Cola, MD Date: 03/29/2021; Time: 10:23 AM

## 2021-03-29 ENCOUNTER — Ambulatory Visit: Payer: Medicare PPO | Attending: Pain Medicine | Admitting: Pain Medicine

## 2021-03-29 ENCOUNTER — Other Ambulatory Visit: Payer: Self-pay

## 2021-03-29 DIAGNOSIS — G8929 Other chronic pain: Secondary | ICD-10-CM

## 2021-03-29 DIAGNOSIS — Z7901 Long term (current) use of anticoagulants: Secondary | ICD-10-CM | POA: Diagnosis not present

## 2021-03-29 DIAGNOSIS — M545 Low back pain, unspecified: Secondary | ICD-10-CM | POA: Diagnosis not present

## 2021-03-29 DIAGNOSIS — M4310 Spondylolisthesis, site unspecified: Secondary | ICD-10-CM

## 2021-03-29 DIAGNOSIS — G894 Chronic pain syndrome: Secondary | ICD-10-CM

## 2021-03-29 DIAGNOSIS — M4807 Spinal stenosis, lumbosacral region: Secondary | ICD-10-CM | POA: Diagnosis not present

## 2021-03-29 DIAGNOSIS — M47816 Spondylosis without myelopathy or radiculopathy, lumbar region: Secondary | ICD-10-CM | POA: Diagnosis not present

## 2021-03-29 NOTE — Patient Instructions (Addendum)
____________________________________________________________________________________________  Preparing for your procedure (without sedation)  Procedure appointments are limited to planned procedures: . No Prescription Refills. . No disability issues will be discussed. . No medication changes will be discussed.  Instructions: . Oral Intake: Do not eat or drink anything for at least 6 hours prior to your procedure. (Exception: Blood Pressure Medication. See below.) . Transportation: Unless otherwise stated by your physician, you may drive yourself after the procedure. . Blood Pressure Medicine: Do not forget to take your blood pressure medicine with a sip of water the morning of the procedure. If your Diastolic (lower reading)is above 100 mmHg, elective cases will be cancelled/rescheduled. . Blood thinners: These will need to be stopped for procedures. Notify our staff if you are taking any blood thinners. Depending on which one you take, there will be specific instructions on how and when to stop it. . Diabetics on insulin: Notify the staff so that you can be scheduled 1st case in the morning. If your diabetes requires high dose insulin, take only  of your normal insulin dose the morning of the procedure and notify the staff that you have done so. . Preventing infections: Shower with an antibacterial soap the morning of your procedure.  . Build-up your immune system: Take 1000 mg of Vitamin C with every meal (3 times a day) the day prior to your procedure. . Antibiotics: Inform the staff if you have a condition or reason that requires you to take antibiotics before dental procedures. . Pregnancy: If you are pregnant, call and cancel the procedure. . Sickness: If you have a cold, fever, or any active infections, call and cancel the procedure. . Arrival: You must be in the facility at least 30 minutes prior to your scheduled procedure. . Children: Do not bring any children with you. . Dress  appropriately: Bring dark clothing that you would not mind if they get stained. . Valuables: Do not bring any jewelry or valuables.  Reasons to call and reschedule or cancel your procedure: (Following these recommendations will minimize the risk of a serious complication.) . Surgeries: Avoid having procedures within 2 weeks of any surgery. (Avoid for 2 weeks before or after any surgery). . Flu Shots: Avoid having procedures within 2 weeks of a flu shots or . (Avoid for 2 weeks before or after immunizations). . Barium: Avoid having a procedure within 7-10 days after having had a radiological study involving the use of radiological contrast. (Myelograms, Barium swallow or enema study). . Heart attacks: Avoid any elective procedures or surgeries for the initial 6 months after a "Myocardial Infarction" (Heart Attack). . Blood thinners: It is imperative that you stop these medications before procedures. Let us know if you if you take any blood thinner.  . Infection: Avoid procedures during or within two weeks of an infection (including chest colds or gastrointestinal problems). Symptoms associated with infections include: Localized redness, fever, chills, night sweats or profuse sweating, burning sensation when voiding, cough, congestion, stuffiness, runny nose, sore throat, diarrhea, nausea, vomiting, cold or Flu symptoms, recent or current infections. It is specially important if the infection is over the area that we intend to treat. . Heart and lung problems: Symptoms that may suggest an active cardiopulmonary problem include: cough, chest pain, breathing difficulties or shortness of breath, dizziness, ankle swelling, uncontrolled high or unusually low blood pressure, and/or palpitations. If you are experiencing any of these symptoms, cancel your procedure and contact your primary care physician for an evaluation.  Remember:  Regular   Business hours are:  Monday to Thursday 8:00 AM to 4:00  PM  Provider's Schedule: Milinda Pointer, MD:  Procedure days: Tuesday and Thursday 7:30 AM to 4:00 PM  Gillis Santa, MD:  Procedure days: Monday and Wednesday 7:30 AM to 4:00 PM ____________________________________________________________________________________________   ____________________________________________________________________________________________  General Risks and Possible Complications  Patient Responsibilities: It is important that you read this as it is part of your informed consent. It is our duty to inform you of the risks and possible complications associated with treatments offered to you. It is your responsibility as a patient to read this and to ask questions about anything that is not clear or that you believe was not covered in this document.  Patient's Rights: You have the right to refuse treatment. You also have the right to change your mind, even after initially having agreed to have the treatment done. However, under this last option, if you wait until the last second to change your mind, you may be charged for the materials used up to that point.  Introduction: Medicine is not an Chief Strategy Officer. Everything in Medicine, including the lack of treatment(s), carries the potential for danger, harm, or loss (which is by definition: Risk). In Medicine, a complication is a secondary problem, condition, or disease that can aggravate an already existing one. All treatments carry the risk of possible complications. The fact that a side effects or complications occurs, does not imply that the treatment was conducted incorrectly. It must be clearly understood that these can happen even when everything is done following the highest safety standards.  No treatment: You can choose not to proceed with the proposed treatment alternative. The "PRO(s)" would include: avoiding the risk of complications associated with the therapy. The "CON(s)" would include: not getting any of the  treatment benefits. These benefits fall under one of three categories: diagnostic; therapeutic; and/or palliative. Diagnostic benefits include: getting information which can ultimately lead to improvement of the disease or symptom(s). Therapeutic benefits are those associated with the successful treatment of the disease. Finally, palliative benefits are those related to the decrease of the primary symptoms, without necessarily curing the condition (example: decreasing the pain from a flare-up of a chronic condition, such as incurable terminal cancer).  General Risks and Complications: These are associated to most interventional treatments. They can occur alone, or in combination. They fall under one of the following six (6) categories: no benefit or worsening of symptoms; bleeding; infection; nerve damage; allergic reactions; and/or death. 1. No benefits or worsening of symptoms: In Medicine there are no guarantees, only probabilities. No healthcare provider can ever guarantee that a medical treatment will work, they can only state the probability that it may. Furthermore, there is always the possibility that the condition may worsen, either directly, or indirectly, as a consequence of the treatment. 2. Bleeding: This is more common if the patient is taking a blood thinner, either prescription or over the counter (example: Goody Powders, Fish oil, Aspirin, Garlic, etc.), or if suffering a condition associated with impaired coagulation (example: Hemophilia, cirrhosis of the liver, low platelet counts, etc.). However, even if you do not have one on these, it can still happen. If you have any of these conditions, or take one of these drugs, make sure to notify your treating physician. 3. Infection: This is more common in patients with a compromised immune system, either due to disease (example: diabetes, cancer, human immunodeficiency virus [HIV], etc.), or due to medications or treatments (example: therapies used  to treat cancer and rheumatological diseases). However, even if you do not have one on these, it can still happen. If you have any of these conditions, or take one of these drugs, make sure to notify your treating physician. 4. Nerve Damage: This is more common when the treatment is an invasive one, but it can also happen with the use of medications, such as those used in the treatment of cancer. The damage can occur to small secondary nerves, or to large primary ones, such as those in the spinal cord and brain. This damage may be temporary or permanent and it may lead to impairments that can range from temporary numbness to permanent paralysis and/or brain death. 5. Allergic Reactions: Any time a substance or material comes in contact with our body, there is the possibility of an allergic reaction. These can range from a mild skin rash (contact dermatitis) to a severe systemic reaction (anaphylactic reaction), which can result in death. 6. Death: In general, any medical intervention can result in death, most of the time due to an unforeseen complication. ____________________________________________________________________________________________  ____________________________________________________________________________________________  Blood Thinners  IMPORTANT NOTICE:  If you take any of these, make sure to notify the nursing staff.  Failure to do so may result in injury.  Recommended time intervals to stop and restart blood-thinners, before & after invasive procedures  Generic Name Brand Name Stop Time. Must be stopped at least this long before procedures. After procedures, wait at least this long before re-starting.  Abciximab Reopro 15 days 2 hrs  Alteplase Activase 10 days 10 days  Anagrelide Agrylin    Apixaban Eliquis 3 days 6 hrs  Cilostazol Pletal 3 days 5 hrs  Clopidogrel Plavix 7-10 days 2 hrs  Dabigatran Pradaxa 5 days 6 hrs  Dalteparin Fragmin 24 hours 4 hrs  Dipyridamole  Aggrenox 11days 2 hrs  Edoxaban Lixiana; Savaysa 3 days 2 hrs  Enoxaparin  Lovenox 24 hours 4 hrs  Eptifibatide Integrillin 8 hours 2 hrs  Fondaparinux  Arixtra 72 hours 12 hrs  Hydroxychloroquine Plaquenil 11 days   Prasugrel Effient 7-10 days 6 hrs  Reteplase Retavase 10 days 10 days  Rivaroxaban Xarelto 3 days 6 hrs  Ticagrelor Brilinta 5-7 days 6 hrs  Ticlopidine Ticlid 10-14 days 2 hrs  Tinzaparin Innohep 24 hours 4 hrs  Tirofiban Aggrastat 8 hours 2 hrs  Warfarin Coumadin 5 days 2 hrs   Other medications with blood-thinning effects  Product indications Generic (Brand) names Note  Cholesterol Lipitor Stop 4 days before procedure  Blood thinner (injectable) Heparin (LMW or LMWH Heparin) Stop 24 hours before procedure  Cancer Ibrutinib (Imbruvica) Stop 7 days before procedure  Malaria/Rheumatoid Hydroxychloroquine (Plaquenil) Stop 11 days before procedure  Thrombolytics  10 days before or after procedures   Over-the-counter (OTC) Products with blood-thinning effects  Product Common names Stop Time  Aspirin > 325 mg Goody Powders, Excedrin, etc. 11 days  Aspirin ? 81 mg  7 days  Fish oil  4 days  Garlic supplements  7 days  Ginkgo biloba  36 hours  Ginseng  24 hours  NSAIDs Ibuprofen, Naprosyn, etc. 3 days  Vitamin E  4 days   ____________________________________________________________________________________________   

## 2021-03-30 ENCOUNTER — Ambulatory Visit: Payer: Medicare PPO

## 2021-04-01 ENCOUNTER — Ambulatory Visit (INDEPENDENT_AMBULATORY_CARE_PROVIDER_SITE_OTHER): Payer: Medicare PPO

## 2021-04-01 ENCOUNTER — Other Ambulatory Visit: Payer: Self-pay

## 2021-04-01 VITALS — BP 134/62 | HR 61 | Temp 97.6°F | Resp 18

## 2021-04-01 DIAGNOSIS — J4540 Moderate persistent asthma, uncomplicated: Secondary | ICD-10-CM

## 2021-04-01 DIAGNOSIS — J4550 Severe persistent asthma, uncomplicated: Secondary | ICD-10-CM | POA: Diagnosis not present

## 2021-04-01 MED ORDER — FAMOTIDINE IN NACL 20-0.9 MG/50ML-% IV SOLN: 20.0000 mg | Freq: Once | INTRAVENOUS | Status: DC | PRN

## 2021-04-01 MED ORDER — OMALIZUMAB 75 MG/0.5ML ~~LOC~~ SOSY
375.0000 mg | PREFILLED_SYRINGE | Freq: Once | SUBCUTANEOUS | Status: AC
  Administered 2021-04-01: 375 mg via SUBCUTANEOUS
  Filled 2021-04-01: qty 2

## 2021-04-01 MED ORDER — METHYLPREDNISOLONE SODIUM SUCC 125 MG IJ SOLR: 125.0000 mg | Freq: Once | INTRAMUSCULAR | Status: DC | PRN

## 2021-04-01 MED ORDER — ALBUTEROL SULFATE HFA 108 (90 BASE) MCG/ACT IN AERS: 2.0000 | INHALATION_SPRAY | Freq: Once | RESPIRATORY_TRACT | Status: DC | PRN

## 2021-04-01 MED ORDER — EPINEPHRINE 0.3 MG/0.3ML IJ SOAJ: 0.3000 mg | Freq: Once | INTRAMUSCULAR | Status: DC | PRN

## 2021-04-01 MED ORDER — DIPHENHYDRAMINE HCL 50 MG/ML IJ SOLN: 50.0000 mg | Freq: Once | INTRAMUSCULAR | Status: DC | PRN

## 2021-04-01 MED ORDER — SODIUM CHLORIDE 0.9 % IV SOLN: Freq: Once | INTRAVENOUS | Status: DC | PRN

## 2021-04-01 NOTE — Progress Notes (Signed)
Diagnosis: Asthma  Provider:  Marshell Garfinkel, MD  Procedure: Injection  Xolair (Omalizumab), Dose: 375 mg, Site: subcutaneous abdomen x2 R. x1 L  Discharge: Condition: Good, Destination: Home . AVS provided to patient.   Performed by:  Paul Dykes, RN

## 2021-04-02 ENCOUNTER — Emergency Department: Payer: Medicare PPO

## 2021-04-02 ENCOUNTER — Other Ambulatory Visit: Payer: Self-pay

## 2021-04-02 ENCOUNTER — Emergency Department
Admission: EM | Admit: 2021-04-02 | Discharge: 2021-04-03 | Disposition: A | Discharge status: Home / Self Care | Payer: Medicare PPO | Attending: Emergency Medicine | Admitting: Emergency Medicine

## 2021-04-02 DIAGNOSIS — R519 Headache, unspecified: Secondary | ICD-10-CM | POA: Diagnosis not present

## 2021-04-02 DIAGNOSIS — W1830XA Fall on same level, unspecified, initial encounter: Secondary | ICD-10-CM | POA: Insufficient documentation

## 2021-04-02 DIAGNOSIS — Z7952 Long term (current) use of systemic steroids: Secondary | ICD-10-CM | POA: Insufficient documentation

## 2021-04-02 DIAGNOSIS — I2510 Atherosclerotic heart disease of native coronary artery without angina pectoris: Secondary | ICD-10-CM | POA: Diagnosis not present

## 2021-04-02 DIAGNOSIS — Z96653 Presence of artificial knee joint, bilateral: Secondary | ICD-10-CM | POA: Diagnosis not present

## 2021-04-02 DIAGNOSIS — N189 Chronic kidney disease, unspecified: Secondary | ICD-10-CM | POA: Insufficient documentation

## 2021-04-02 DIAGNOSIS — S0990XA Unspecified injury of head, initial encounter: Secondary | ICD-10-CM | POA: Insufficient documentation

## 2021-04-02 DIAGNOSIS — Z8546 Personal history of malignant neoplasm of prostate: Secondary | ICD-10-CM | POA: Diagnosis not present

## 2021-04-02 DIAGNOSIS — I509 Heart failure, unspecified: Secondary | ICD-10-CM | POA: Insufficient documentation

## 2021-04-02 DIAGNOSIS — I130 Hypertensive heart and chronic kidney disease with heart failure and stage 1 through stage 4 chronic kidney disease, or unspecified chronic kidney disease: Secondary | ICD-10-CM | POA: Insufficient documentation

## 2021-04-02 DIAGNOSIS — W19XXXA Unspecified fall, initial encounter: Secondary | ICD-10-CM

## 2021-04-02 DIAGNOSIS — Z79899 Other long term (current) drug therapy: Secondary | ICD-10-CM | POA: Diagnosis not present

## 2021-04-02 DIAGNOSIS — J4550 Severe persistent asthma, uncomplicated: Secondary | ICD-10-CM | POA: Insufficient documentation

## 2021-04-02 DIAGNOSIS — I10 Essential (primary) hypertension: Secondary | ICD-10-CM | POA: Diagnosis not present

## 2021-04-02 MED ORDER — ACETAMINOPHEN 325 MG PO TABS
650.0000 mg | ORAL_TABLET | Freq: Once | ORAL | Status: AC
  Administered 2021-04-02: 650 mg via ORAL
  Filled 2021-04-02: qty 2

## 2021-04-02 NOTE — ED Triage Notes (Signed)
Pt presents via POV with a cc of fall around 1600 today. Pt reports walker got caught up on a rug and he "tucked and rolled onto L side". Pt reports hitting back of head. Taking Eliquis. C/o of pain to back of head and L side of bottom. Pt denies LOC.

## 2021-04-02 NOTE — Discharge Instructions (Signed)
You may take Tylenol as needed for pain.  Return to the ER for worsening symptoms, persistent vomiting, lethargy or other concerns. °

## 2021-04-02 NOTE — ED Provider Notes (Signed)
[facility] Emergency Department Provider Note   ____________________________________________   Event Date/Time   First MD Initiated Contact with Patient 04/02/21 2308     (approximate)  I have reviewed the triage vital signs and the nursing notes.   HISTORY  Chief Complaint Fall    HPI [name] is a 87 y.o. male who presents to the ED from home status post mechanical fall.  Patient was ambulating with his walker when the walker got caught up on a rug and he "tucked and rolled onto his left buttock".  Reports striking the back of his head on a doubly carpeted surface.  Denies LOC.  Patient does take Eliquis.  Complains of posterior head pain and left bottom pain which has since subsided.  Patient was able to ambulate after the fall.  Denies vision changes, neck pain, chest pain, shortness of breath, abdominal pain, nausea, vomiting or dizziness.     Past Medical History:  Diagnosis Date  . (HFpEF) heart failure with preserved ejection fraction ([place])    a. 02/2019 Echo: Ef 60-65%, mildly reduced RV fxn. RVSP 28mmHg. Mildly dil LA. Mod dil RA. Mild to mod TR. Mild to mod AS. Triv AI.  Marland Kitchen Allergy   . Anemia   . Anxiety   . Arthritis    rheumatoid  . Asthma   . Basal cell carcinoma    back  . CAD (coronary artery disease)   . CHF (congestive heart failure) ([place])   . Chronic kidney disease    Mass right kidney  . Chronic sinusitis   . Collagen vascular disease (HCC)    Rheumatoid Arthritis  . Depression   . Diverticulitis   . Dysrhythmia   . ED (erectile dysfunction)   . GERD (gastroesophageal reflux disease)   . History of SIADH   . Hx of adenomatous colonic polyps   . Hypertension   . Hyponatremia   . IBS (irritable bowel syndrome)   . Neurodermatitis   . Neurogenic bladder   . OSA (obstructive sleep apnea)    no CPAP since weight loss  . Persistent atrial fibrillation ([place])    a. Dx 02/2019; b. CHA2DS2VASc = 6-->eliquis initiated.  .  Prostate cancer ([place])   . Renal mass    10/2018 4.2cm R renal cortical mass. Most compatible w/ clear cell renal cell carcinoma.    Patient Active Problem List   Diagnosis Date Noted  . Severe persistent asthma 02/05/2021  . Postprocedure pain management follow-up 12/08/2020  . Spondylosis without myelopathy or radiculopathy, lumbosacral region 11/24/2020  . Elevated C-reactive protein (CRP) 11/24/2020  . Elevated sed rate 11/24/2020  . Chronic pain syndrome 11/04/2020  . Pharmacologic therapy 11/04/2020  . Disorder of skeletal system 11/04/2020  . Problems influencing health status 11/04/2020  . Abnormal MRI, lumbar spine (09/21/2020) 11/04/2020  . Chronic low back pain (Bilateral) w/o sciatica 11/04/2020  . DDD (degenerative disc disease), lumbosacral 11/04/2020  . Lumbosacral foraminal stenosis 11/04/2020  . Lumbar facet hypertrophy 11/04/2020  . Levoscoliosis of lumbosacral spine 11/04/2020  . Grade 1 Retrolisthesis at L2-3 and L3-4 11/04/2020  . Chronic anticoagulation (Eliquis) 11/04/2020  . Lumbar facet syndrome 11/04/2020  . Long term prescription benzodiazepine use 11/04/2020  . Chronic shoulder pain (Bilateral) 11/04/2020  . Chronic elbow pain (Bilateral) 11/04/2020  . Chronic wrist pain (Bilateral) 11/04/2020  . Back pain without sciatica 07/18/2020  . Chronic sinusitis 07/12/2020  . Persistent atrial fibrillation ([place]) 07/12/2020  . Chest pain 04/02/2020  .  Anemia in chronic kidney disease 09/12/2019  . Edema of lower extremity 09/12/2019  . Hyposmolality and/or hyponatremia 09/12/2019  . Renal cell carcinoma ([place]) 07/11/2019  . Paroxysmal atrial fibrillation ([place]) 05/09/2019  . History of TIA (transient ischemic attack) 12/06/2018  . Vision changes 12/06/2018  . TIA (transient ischemic attack) 06/06/2018  . Carotid artery disease ([place]) 06/06/2018  . Frailty 04/24/2018  . Long term current use of immunosuppressive drug 04/24/2018  . Obesity (BMI 30-39.9)  04/24/2018  . SIADH (syndrome of inappropriate ADH production) ([place]) 04/17/2017  . AKI (acute kidney injury) ([place]) 04/03/2017  . Hyponatremia 01/16/2017  . Urinary tract infection associated with indwelling urethral catheter ([place]) 12/19/2016  . Asthma with acute exacerbation 11/14/2016  . Mild intermittent asthma without complication 73/22/0254  . Mood disorder ([place]) 01/18/2016  . Presence of indwelling urinary catheter 08/20/2015  . Advanced directives, counseling/discussion 07/28/2014  . Routine general medical examination at a health care facility 04/09/2012  . Venous stasis dermatitis 06/13/2011  . CONSTIPATION, CHRONIC 12/07/2010  . Chronic constipation 12/07/2010  . Hyperlipidemia 12/02/2010  . OSTEOARTHRITIS 12/02/2010  . Neurogenic bladder 05/24/2010  . IBS 05/05/2010  . History of colonic polyps 05/05/2010  . NEUROPATHY 05/03/2010  . Mononeuritis 05/03/2010  . Personal history of prostate cancer 03/04/2010  . Benign essential hypertension 03/25/2009  . GAD (generalized anxiety disorder) 01/27/2007  . Coronary atherosclerosis of native coronary artery 01/27/2007  . Allergic asthma 01/27/2007  . GERD 01/27/2007  . Chronic rheumatic arthritis ([place]) 01/27/2007  . Anxiety 01/27/2007  . Rheumatoid arthritis involving multiple sites with positive rheumatoid factor ([place]) 01/27/2007    Past Surgical History:  Procedure Laterality Date  . CARDIOVERSION N/A 04/23/2019   Procedure: CARDIOVERSION;  Surgeon: Nelva Bush, MD;  Location: [facility] ORS;  Service: Cardiovascular;  Laterality: N/A;  . KNEE ARTHROPLASTY Right 09/02/2015   Procedure: COMPUTER ASSISTED TOTAL KNEE ARTHROPLASTY;  Surgeon: [name], MD;  Location: [facility] ORS;  Service: Orthopedics;  Laterality: Right;  . LAPAROSCOPIC NEPHRECTOMY, HAND ASSISTED Right 06/17/2019   Procedure: HAND ASSISTED LAPAROSCOPIC NEPHRECTOMY;  Surgeon: Hollice Espy, MD;  Location: [facility] ORS;  Service: Urology;  Laterality: Right;  .  NASAL SINUS SURGERY  2009   DEVIATED SEPTUM AND POLYPS  . permanent indwelling catheter    . PROSTATE SURGERY     PROSTATECTOMY  . TOTAL KNEE ARTHROPLASTY Left 9/15   Dr [name]  . URETHRAL STRICTURE DILATATION  02-2010   Dr.Cope    Prior to Admission medications   Medication Sig Start Date End Date Taking? Authorizing Provider  acetaminophen (TYLENOL) 500 MG tablet Take 500 mg by mouth every 8 (eight) hours as needed for moderate pain or headache.     [provider]  ALOE PO Take 400 mg by mouth daily.     [provider]  ALPRAZolam Duanne Moron) 0.5 MG tablet TAKE 1 TABLET BY MOUTH THREE TIMES DAILY AS NEEDED FOR ANXIETY OR SLEEP 03/15/21   Dutch Quint B, FNP  Ascorbic Acid (VITAMIN C PO) Take 1,000 mg by mouth daily.     [provider]  Calcium-Magnesium-Zinc [phone] MG TABS Take 1 tablet by mouth daily.    [provider]  chlorhexidine (PERIDEX) 0.12 % solution 1 mL by Mouth Rinse route as needed. 10/13/20   [provider]  Cholecalciferol (VITAMIN D3) 5000 UNITS CAPS Take 2,000 Units by mouth daily.     [provider]  Coenzyme Q10 (CO Q 10) 100 MG CAPS Take 100 mg by mouth daily.  [provider]  CRANBERRY SOFT PO Take 25,200 mg by mouth daily. Takes 2 capsules once a day.    [provider]  ELIQUIS 5 MG TABS tablet TAKE 1 TABLET(5 MG) BY MOUTH TWICE DAILY 01/08/21   Wellington Hampshire, MD  EPINEPHrine 0.3 mg/0.3 mL IJ SOAJ injection Inject 0.3 mg into the muscle as needed for anaphylaxis.    [provider]  erythromycin ophthalmic ointment SMARTSIG:0.25 Sparingly In Eye(s) Every Night 02/11/21   [provider]  fluticasone (FLONASE) 50 MCG/ACT nasal spray Place 2 sprays into both nostrils 2 (two) times daily. 04/27/17   [name], MD  folic acid (FOLVITE) 563 MCG tablet Take 1,200 mcg by mouth daily.    [provider]  furosemide (LASIX) 20 MG tablet TAKE 1 TABLET(20 MG)  BY MOUTH DAILY 12/28/20   Wellington Hampshire, MD  gabapentin (NEURONTIN) 300 MG capsule Take 1 capsule (300 mg total) by mouth 3 (three) times daily. Patient taking differently: Take 300 mg by mouth 4 (four) times daily. 600 mg a.m., 300 lunch, 600 p.m. 01/20/20   Viviana Simpler I, MD  hydroxychloroquine (PLAQUENIL) 200 MG tablet Take 200 mg by mouth 2 (two) times daily.  01/22/16   [provider]  ketoconazole (NIZORAL) 2 % cream Apply 1 application topically 2 (two) times daily. 02/12/21   Debroah Loop, PA-C  ketoconazole (NIZORAL) 2 % shampoo  06/15/20   [provider]  methotrexate (RHEUMATREX) 2.5 MG tablet Take 10 tablets by mouth once a week. 07/01/19   [provider]  metoprolol tartrate (LOPRESSOR) 25 MG tablet Take 0.5 tablets (12.5 mg total) by mouth 2 (two) times daily. 04/06/20 07/05/20  Loel Dubonnet, NP  milk thistle 175 MG tablet Take 175 mg by mouth daily.    [provider]  Multiple Vitamins-Minerals (BALANCED CARE SENIORS PO) Take 6 tablets by mouth daily. 3 tablets of fruit and 3 tablets of vegetables daily  Balance of Nature    [provider]  MYRBETRIQ 50 MG TB24 tablet TAKE 1 TABLET(50 MG) BY MOUTH DAILY 01/12/21   Hollice Espy, MD  Omega-3 Fatty Acids (FISH OIL) 1200 MG CAPS Take 1,200 mg by mouth daily.    [provider]  omeprazole (PRILOSEC) 20 MG capsule Take 1 capsule (20 mg total) by mouth daily as needed. 02/09/21   [name], MD  PHOSPHATIDYL CHOLINE PO Take 1 tablet by mouth daily.    [provider]  polyethylene glycol powder (GLYCOLAX/MIRALAX) 17 GM/SCOOP powder Take 17 g by mouth at bedtime.    [provider]  pravastatin (PRAVACHOL) 20 MG tablet TAKE 1 TABLET(20 MG) BY MOUTH AT BEDTIME 01/18/21   [name], MD  Probiotic Product (ALIGN) 4 MG CAPS Take 1 capsule (4 mg total) by mouth daily. 12/15/16   Nicholes Mango, MD  Selenium 200 MCG CAPS Take 200 mcg by mouth  daily.     [provider]  triamcinolone ointment (KENALOG) 0.1 % Apply 1 application topically as needed. 10/06/20   [provider]  Turmeric 500 MG CAPS Take 500 mg by mouth daily.     [provider]  vitamin B-12 (CYANOCOBALAMIN) 1000 MCG tablet Take 1,000 mcg by mouth daily.    [provider]  Zinc 50 MG TABS Take by mouth.    [provider]    Allergies Ciprofloxacin, Citalopram hydrobromide, Clindamycin, Lorazepam, Paroxetine, Ramipril, Simvastatin, and Sulfa antibiotics  Family History  Problem Relation Age  of Onset  . Heart disease Mother 48       heart failure  . Pneumonia Father 37       pnemonia  . Skin cancer Father   . Skin cancer Sister   . Skin cancer Son   . Colon cancer Neg Hx   . Esophageal cancer Neg Hx   . Stomach cancer Neg Hx   . Pancreatic cancer Neg Hx   . Liver disease Neg Hx     Social History Social History   Tobacco Use  . Smoking status: Never Smoker  . Smokeless tobacco: Never Used  Vaping Use  . Vaping Use: Never used  Substance Use Topics  . Alcohol use: Yes    Comment: 5 days out of 7 drinks gin   . Drug use: No    Review of Systems  Constitutional: No fever/chills Eyes: No visual changes. ENT: No sore throat. Cardiovascular: Denies chest pain. Respiratory: Denies shortness of breath. Gastrointestinal: No abdominal pain.  No nausea, no vomiting.  No diarrhea.  No constipation. Genitourinary: Negative for dysuria. Musculoskeletal: Positive for pain to left buttock.  Negative for back pain. Skin: Negative for rash. Neurological: Positive for head pain.  Negative for headaches, focal weakness or numbness.   ____________________________________________   PHYSICAL EXAM:  VITAL SIGNS: ED Triage Vitals  Enc Vitals Group     BP 04/02/21 2246 132/67     Pulse Rate 04/02/21 2246 69     Resp 04/02/21 2246 18     Temp 04/02/21 2246 98.5 F (36.9 C)     Temp Source 04/02/21 2246  Oral     SpO2 04/02/21 2246 95 %     Weight 04/02/21 2244 212 lb (96.2 kg)     Height 04/02/21 2244 5\' 9"  (1.753 m)     Head Circumference --      Peak Flow --      Pain Score 04/02/21 2243 2     Pain Loc --      Pain Edu? --      Excl. in [place]? --     Constitutional: Alert and oriented.  Elderly appearing and in no acute distress. Eyes: Conjunctivae are normal. PERRL. EOMI. Head: Atraumatic.  Pre-existing scab on the vertex of head.  No hematoma or laceration noted Nose: Atraumatic. Mouth/Throat: Mucous membranes are moist.  No dental malocclusion. Neck: No stridor.  No cervical spine tenderness to palpation. Cardiovascular: Normal rate, regular rhythm. Grossly normal heart sounds.  Good peripheral circulation. Respiratory: Normal respiratory effort.  No retractions. Lungs CTAB. Gastrointestinal: Soft and nontender to light or deep palpation. No distention. No abdominal bruits. No CVA tenderness. Musculoskeletal: No spinal tenderness to palpation.  Pelvis is stable.  Full range of motion bilateral hips without pain.  Patient rolled to his right side to examine skin on his buttocks; no ecchymosis noted.  No lower extremity tenderness nor edema.  No joint effusions. Neurologic: Alert and oriented x3.  CN II to XII grossly intact normal speech and language. No gross focal neurologic deficits are appreciated. No gait instability. Skin:  Skin is warm, dry and intact. No rash noted. Psychiatric: Mood and affect are normal. Speech and behavior are normal.  ____________________________________________   LABS (all labs ordered are listed, but only abnormal results are displayed)  Labs Reviewed - No data to display ____________________________________________  EKG  ED ECG REPORT I, Myanna Ziesmer J, the attending physician, personally viewed and interpreted this ECG.   Date: 04/03/2021  EKG Time: 2350  Rate: 67  Rhythm: normal EKG, normal sinus rhythm  Axis: LAD  Intervals:left anterior  fascicular block  ST&T Change: Nonspecific  ____________________________________________  RADIOLOGY I, Tekoa Hamor J, personally viewed and evaluated these images (plain radiographs) as part of my medical decision making, as well as reviewing the written report by the radiologist.  ED MD interpretation: No ICH  Official radiology report(s): CT Head Wo Contrast  Result Date: 04/02/2021 CLINICAL DATA:  Recent fall with headaches, initial encounter EXAM: CT HEAD WITHOUT CONTRAST TECHNIQUE: Contiguous axial images were obtained from the base of the skull through the vertex without intravenous contrast. COMPARISON:  05/25/2018 FINDINGS: Brain: No evidence of acute infarction, hemorrhage, hydrocephalus, extra-axial collection or mass lesion/mass effect. Chronic atrophic and ischemic changes are noted. Vascular: No hyperdense vessel or unexpected calcification. Skull: Normal. Negative for fracture or focal lesion. Sinuses/Orbits: No acute finding. Other: None. IMPRESSION: Chronic atrophic and ischemic changes without acute abnormality. Electronically Signed   By: Inez Catalina M.D.   On: 04/02/2021 23:32    ____________________________________________   PROCEDURES  Procedure(s) performed (including Critical Care):  Procedures   ____________________________________________   INITIAL IMPRESSION / ASSESSMENT AND PLAN / ED COURSE  As part of my medical decision making, I reviewed the following data within the [place] History obtained from family, Nursing notes reviewed and incorporated, EKG interpreted, Old chart reviewed, Radiograph reviewed and Notes from prior ED visits     87 year old male on Eliquis presenting status post mechanical fall striking posterior head.  Differential diagnosis includes but is not limited to [place], SDH, SAH, contusion, coccyx fracture, hip fracture, etc.  CT head negative for intracranial hemorrhage.  No external bruising or signs of injury to left  buttock.  Patient ambulatory after fall without hip pain.  Tylenol given for head pain.  Strict return precautions given.  Patient and daughter verbalized understanding and agree with plan of care.      ____________________________________________   FINAL CLINICAL IMPRESSION(S) / ED DIAGNOSES  Final diagnoses:  Fall, initial encounter  Injury of head, initial encounter     ED Discharge Orders    None      *Please note:  NAASIR CARREIRA was evaluated in Emergency Department on 04/02/2021 for the symptoms described in the history of present illness. He was evaluated in the context of the global COVID-19 pandemic, which necessitated consideration that the patient might be at risk for infection with the SARS-CoV-2 virus that causes COVID-19. Institutional protocols and algorithms that pertain to the evaluation of patients at risk for COVID-19 are in a state of rapid change based on information released by regulatory bodies including the CDC and federal and state organizations. These policies and algorithms were followed during the patient's care in the ED.  Some ED evaluations and interventions may be delayed as a result of limited staffing during and the pandemic.*   Note:  This document was prepared using Dragon voice recognition software and may include unintentional dictation errors.   Paulette Blanch, MD 04/03/21 [phone]

## 2021-04-02 NOTE — ED Provider Notes (Incomplete)
[facility] Emergency Department Provider Note   ____________________________________________   Event Date/Time   First MD Initiated Contact with Patient 04/02/21 2308     (approximate)  I have reviewed the triage vital signs and the nursing notes.   HISTORY  Chief Complaint Fall    HPI [name] is a 87 y.o. male who presents to the ED from home status post mechanical fall.  Patient was ambulating with his walker when the walker got caught up on a rug and he "tucked and rolled onto his left buttock".  Reports striking the back of his head on a doubly carpeted surface.  Denies LOC.  Patient does take Eliquis.  Complains of posterior head pain and left bottom pain which has since subsided.  Patient was able to ambulate after the fall.  Denies vision changes, neck pain, chest pain, shortness of breath, abdominal pain, nausea, vomiting or dizziness.     Past Medical History:  Diagnosis Date  . (HFpEF) heart failure with preserved ejection fraction ([place])    a. 02/2019 Echo: Ef 60-65%, mildly reduced RV fxn. RVSP 63mmHg. Mildly dil LA. Mod dil RA. Mild to mod TR. Mild to mod AS. Triv AI.  Marland Kitchen Allergy   . Anemia   . Anxiety   . Arthritis    rheumatoid  . Asthma   . Basal cell carcinoma    back  . CAD (coronary artery disease)   . CHF (congestive heart failure) ([place])   . Chronic kidney disease    Mass right kidney  . Chronic sinusitis   . Collagen vascular disease (HCC)    Rheumatoid Arthritis  . Depression   . Diverticulitis   . Dysrhythmia   . ED (erectile dysfunction)   . GERD (gastroesophageal reflux disease)   . History of SIADH   . Hx of adenomatous colonic polyps   . Hypertension   . Hyponatremia   . IBS (irritable bowel syndrome)   . Neurodermatitis   . Neurogenic bladder   . OSA (obstructive sleep apnea)    no CPAP since weight loss  . Persistent atrial fibrillation ([place])    a. Dx 02/2019; b. CHA2DS2VASc = 6-->eliquis initiated.  .  Prostate cancer ([place])   . Renal mass    10/2018 4.2cm R renal cortical mass. Most compatible w/ clear cell renal cell carcinoma.    Patient Active Problem List   Diagnosis Date Noted  . Severe persistent asthma 02/05/2021  . Postprocedure pain management follow-up 12/08/2020  . Spondylosis without myelopathy or radiculopathy, lumbosacral region 11/24/2020  . Elevated C-reactive protein (CRP) 11/24/2020  . Elevated sed rate 11/24/2020  . Chronic pain syndrome 11/04/2020  . Pharmacologic therapy 11/04/2020  . Disorder of skeletal system 11/04/2020  . Problems influencing health status 11/04/2020  . Abnormal MRI, lumbar spine (09/21/2020) 11/04/2020  . Chronic low back pain (Bilateral) w/o sciatica 11/04/2020  . DDD (degenerative disc disease), lumbosacral 11/04/2020  . Lumbosacral foraminal stenosis 11/04/2020  . Lumbar facet hypertrophy 11/04/2020  . Levoscoliosis of lumbosacral spine 11/04/2020  . Grade 1 Retrolisthesis at L2-3 and L3-4 11/04/2020  . Chronic anticoagulation (Eliquis) 11/04/2020  . Lumbar facet syndrome 11/04/2020  . Long term prescription benzodiazepine use 11/04/2020  . Chronic shoulder pain (Bilateral) 11/04/2020  . Chronic elbow pain (Bilateral) 11/04/2020  . Chronic wrist pain (Bilateral) 11/04/2020  . Back pain without sciatica 07/18/2020  . Chronic sinusitis 07/12/2020  . Persistent atrial fibrillation ([place]) 07/12/2020  . Chest pain 04/02/2020  .  Anemia in chronic kidney disease 09/12/2019  . Edema of lower extremity 09/12/2019  . Hyposmolality and/or hyponatremia 09/12/2019  . Renal cell carcinoma ([place]) 07/11/2019  . Paroxysmal atrial fibrillation ([place]) 05/09/2019  . History of TIA (transient ischemic attack) 12/06/2018  . Vision changes 12/06/2018  . TIA (transient ischemic attack) 06/06/2018  . Carotid artery disease ([place]) 06/06/2018  . Frailty 04/24/2018  . Long term current use of immunosuppressive drug 04/24/2018  . Obesity (BMI 30-39.9)  04/24/2018  . SIADH (syndrome of inappropriate ADH production) ([place]) 04/17/2017  . AKI (acute kidney injury) ([place]) 04/03/2017  . Hyponatremia 01/16/2017  . Urinary tract infection associated with indwelling urethral catheter ([place]) 12/19/2016  . Asthma with acute exacerbation 11/14/2016  . Mild intermittent asthma without complication 73/22/0254  . Mood disorder ([place]) 01/18/2016  . Presence of indwelling urinary catheter 08/20/2015  . Advanced directives, counseling/discussion 07/28/2014  . Routine general medical examination at a health care facility 04/09/2012  . Venous stasis dermatitis 06/13/2011  . CONSTIPATION, CHRONIC 12/07/2010  . Chronic constipation 12/07/2010  . Hyperlipidemia 12/02/2010  . OSTEOARTHRITIS 12/02/2010  . Neurogenic bladder 05/24/2010  . IBS 05/05/2010  . History of colonic polyps 05/05/2010  . NEUROPATHY 05/03/2010  . Mononeuritis 05/03/2010  . Personal history of prostate cancer 03/04/2010  . Benign essential hypertension 03/25/2009  . GAD (generalized anxiety disorder) 01/27/2007  . Coronary atherosclerosis of native coronary artery 01/27/2007  . Allergic asthma 01/27/2007  . GERD 01/27/2007  . Chronic rheumatic arthritis ([place]) 01/27/2007  . Anxiety 01/27/2007  . Rheumatoid arthritis involving multiple sites with positive rheumatoid factor ([place]) 01/27/2007    Past Surgical History:  Procedure Laterality Date  . CARDIOVERSION N/A 04/23/2019   Procedure: CARDIOVERSION;  Surgeon: Nelva Bush, MD;  Location: [facility] ORS;  Service: Cardiovascular;  Laterality: N/A;  . KNEE ARTHROPLASTY Right 09/02/2015   Procedure: COMPUTER ASSISTED TOTAL KNEE ARTHROPLASTY;  Surgeon: [name], MD;  Location: [facility] ORS;  Service: Orthopedics;  Laterality: Right;  . LAPAROSCOPIC NEPHRECTOMY, HAND ASSISTED Right 06/17/2019   Procedure: HAND ASSISTED LAPAROSCOPIC NEPHRECTOMY;  Surgeon: Hollice Espy, MD;  Location: [facility] ORS;  Service: Urology;  Laterality: Right;  .  NASAL SINUS SURGERY  2009   DEVIATED SEPTUM AND POLYPS  . permanent indwelling catheter    . PROSTATE SURGERY     PROSTATECTOMY  . TOTAL KNEE ARTHROPLASTY Left 9/15   Dr [name]  . URETHRAL STRICTURE DILATATION  02-2010   Dr.Cope    Prior to Admission medications   Medication Sig Start Date End Date Taking? Authorizing Provider  acetaminophen (TYLENOL) 500 MG tablet Take 500 mg by mouth every 8 (eight) hours as needed for moderate pain or headache.     [provider]  ALOE PO Take 400 mg by mouth daily.     [provider]  ALPRAZolam Duanne Moron) 0.5 MG tablet TAKE 1 TABLET BY MOUTH THREE TIMES DAILY AS NEEDED FOR ANXIETY OR SLEEP 03/15/21   Dutch Quint B, FNP  Ascorbic Acid (VITAMIN C PO) Take 1,000 mg by mouth daily.     [provider]  Calcium-Magnesium-Zinc [phone] MG TABS Take 1 tablet by mouth daily.    [provider]  chlorhexidine (PERIDEX) 0.12 % solution 1 mL by Mouth Rinse route as needed. 10/13/20   [provider]  Cholecalciferol (VITAMIN D3) 5000 UNITS CAPS Take 2,000 Units by mouth daily.     [provider]  Coenzyme Q10 (CO Q 10) 100 MG CAPS Take 100 mg by mouth daily.  [provider]  CRANBERRY SOFT PO Take 25,200 mg by mouth daily. Takes 2 capsules once a day.    [provider]  ELIQUIS 5 MG TABS tablet TAKE 1 TABLET(5 MG) BY MOUTH TWICE DAILY 01/08/21   Wellington Hampshire, MD  EPINEPHrine 0.3 mg/0.3 mL IJ SOAJ injection Inject 0.3 mg into the muscle as needed for anaphylaxis.    [provider]  erythromycin ophthalmic ointment SMARTSIG:0.25 Sparingly In Eye(s) Every Night 02/11/21   [provider]  fluticasone (FLONASE) 50 MCG/ACT nasal spray Place 2 sprays into both nostrils 2 (two) times daily. 04/27/17   [name], MD  folic acid (FOLVITE) 563 MCG tablet Take 1,200 mcg by mouth daily.    [provider]  furosemide (LASIX) 20 MG tablet TAKE 1 TABLET(20 MG)  BY MOUTH DAILY 12/28/20   Wellington Hampshire, MD  gabapentin (NEURONTIN) 300 MG capsule Take 1 capsule (300 mg total) by mouth 3 (three) times daily. Patient taking differently: Take 300 mg by mouth 4 (four) times daily. 600 mg a.m., 300 lunch, 600 p.m. 01/20/20   Viviana Simpler I, MD  hydroxychloroquine (PLAQUENIL) 200 MG tablet Take 200 mg by mouth 2 (two) times daily.  01/22/16   [provider]  ketoconazole (NIZORAL) 2 % cream Apply 1 application topically 2 (two) times daily. 02/12/21   Debroah Loop, PA-C  ketoconazole (NIZORAL) 2 % shampoo  06/15/20   [provider]  methotrexate (RHEUMATREX) 2.5 MG tablet Take 10 tablets by mouth once a week. 07/01/19   [provider]  metoprolol tartrate (LOPRESSOR) 25 MG tablet Take 0.5 tablets (12.5 mg total) by mouth 2 (two) times daily. 04/06/20 07/05/20  Loel Dubonnet, NP  milk thistle 175 MG tablet Take 175 mg by mouth daily.    [provider]  Multiple Vitamins-Minerals (BALANCED CARE SENIORS PO) Take 6 tablets by mouth daily. 3 tablets of fruit and 3 tablets of vegetables daily  Balance of Nature    [provider]  MYRBETRIQ 50 MG TB24 tablet TAKE 1 TABLET(50 MG) BY MOUTH DAILY 01/12/21   Hollice Espy, MD  Omega-3 Fatty Acids (FISH OIL) 1200 MG CAPS Take 1,200 mg by mouth daily.    [provider]  omeprazole (PRILOSEC) 20 MG capsule Take 1 capsule (20 mg total) by mouth daily as needed. 02/09/21   [name], MD  PHOSPHATIDYL CHOLINE PO Take 1 tablet by mouth daily.    [provider]  polyethylene glycol powder (GLYCOLAX/MIRALAX) 17 GM/SCOOP powder Take 17 g by mouth at bedtime.    [provider]  pravastatin (PRAVACHOL) 20 MG tablet TAKE 1 TABLET(20 MG) BY MOUTH AT BEDTIME 01/18/21   [name], MD  Probiotic Product (ALIGN) 4 MG CAPS Take 1 capsule (4 mg total) by mouth daily. 12/15/16   Nicholes Mango, MD  Selenium 200 MCG CAPS Take 200 mcg by mouth  daily.     [provider]  triamcinolone ointment (KENALOG) 0.1 % Apply 1 application topically as needed. 10/06/20   [provider]  Turmeric 500 MG CAPS Take 500 mg by mouth daily.     [provider]  vitamin B-12 (CYANOCOBALAMIN) 1000 MCG tablet Take 1,000 mcg by mouth daily.    [provider]  Zinc 50 MG TABS Take by mouth.    [provider]    Allergies Ciprofloxacin, Citalopram hydrobromide, Clindamycin, Lorazepam, Paroxetine, Ramipril, Simvastatin, and Sulfa antibiotics  Family History  Problem Relation Age  of Onset  . Heart disease Mother 48       heart failure  . Pneumonia Father 37       pnemonia  . Skin cancer Father   . Skin cancer Sister   . Skin cancer Son   . Colon cancer Neg Hx   . Esophageal cancer Neg Hx   . Stomach cancer Neg Hx   . Pancreatic cancer Neg Hx   . Liver disease Neg Hx     Social History Social History   Tobacco Use  . Smoking status: Never Smoker  . Smokeless tobacco: Never Used  Vaping Use  . Vaping Use: Never used  Substance Use Topics  . Alcohol use: Yes    Comment: 5 days out of 7 drinks gin   . Drug use: No    Review of Systems  Constitutional: No fever/chills Eyes: No visual changes. ENT: No sore throat. Cardiovascular: Denies chest pain. Respiratory: Denies shortness of breath. Gastrointestinal: No abdominal pain.  No nausea, no vomiting.  No diarrhea.  No constipation. Genitourinary: Negative for dysuria. Musculoskeletal: Positive for pain to left buttock.  Negative for back pain. Skin: Negative for rash. Neurological: Positive for head pain.  Negative for headaches, focal weakness or numbness.   ____________________________________________   PHYSICAL EXAM:  VITAL SIGNS: ED Triage Vitals  Enc Vitals Group     BP 04/02/21 2246 132/67     Pulse Rate 04/02/21 2246 69     Resp 04/02/21 2246 18     Temp 04/02/21 2246 98.5 F (36.9 C)     Temp Source 04/02/21 2246  Oral     SpO2 04/02/21 2246 95 %     Weight 04/02/21 2244 212 lb (96.2 kg)     Height 04/02/21 2244 5\' 9"  (1.753 m)     Head Circumference --      Peak Flow --      Pain Score 04/02/21 2243 2     Pain Loc --      Pain Edu? --      Excl. in [place]? --     Constitutional: Alert and oriented.  Elderly appearing and in no acute distress. Eyes: Conjunctivae are normal. PERRL. EOMI. Head: Atraumatic.  Pre-existing scab on the vertex of head.  No hematoma or laceration noted Nose: Atraumatic. Mouth/Throat: Mucous membranes are moist.  No dental malocclusion. Neck: No stridor.  No cervical spine tenderness to palpation. Cardiovascular: Normal rate, regular rhythm. Grossly normal heart sounds.  Good peripheral circulation. Respiratory: Normal respiratory effort.  No retractions. Lungs CTAB. Gastrointestinal: Soft and nontender to light or deep palpation. No distention. No abdominal bruits. No CVA tenderness. Musculoskeletal: No spinal tenderness to palpation.  Pelvis is stable.  Full range of motion bilateral hips without pain.  Patient rolled to his right side to examine skin on his buttocks; no ecchymosis noted.  No lower extremity tenderness nor edema.  No joint effusions. Neurologic: Alert and oriented x3.  CN II to XII grossly intact normal speech and language. No gross focal neurologic deficits are appreciated. No gait instability. Skin:  Skin is warm, dry and intact. No rash noted. Psychiatric: Mood and affect are normal. Speech and behavior are normal.  ____________________________________________   LABS (all labs ordered are listed, but only abnormal results are displayed)  Labs Reviewed - No data to display ____________________________________________  EKG  ED ECG REPORT I, Marisol Giambra J, the attending physician, personally viewed and interpreted this ECG.   Date: 04/03/2021  EKG Time: 2350  Rate: 67  Rhythm: normal EKG, normal sinus rhythm  Axis: LAD  Intervals:left anterior  fascicular block  ST&T Change: Nonspecific  ____________________________________________  RADIOLOGY I, Jenisa Monty J, personally viewed and evaluated these images (plain radiographs) as part of my medical decision making, as well as reviewing the written report by the radiologist.  ED MD interpretation: No ICH  Official radiology report(s): CT Head Wo Contrast  Result Date: 04/02/2021 CLINICAL DATA:  Recent fall with headaches, initial encounter EXAM: CT HEAD WITHOUT CONTRAST TECHNIQUE: Contiguous axial images were obtained from the base of the skull through the vertex without intravenous contrast. COMPARISON:  05/25/2018 FINDINGS: Brain: No evidence of acute infarction, hemorrhage, hydrocephalus, extra-axial collection or mass lesion/mass effect. Chronic atrophic and ischemic changes are noted. Vascular: No hyperdense vessel or unexpected calcification. Skull: Normal. Negative for fracture or focal lesion. Sinuses/Orbits: No acute finding. Other: None. IMPRESSION: Chronic atrophic and ischemic changes without acute abnormality. Electronically Signed   By: Inez Catalina M.D.   On: 04/02/2021 23:32    ____________________________________________   PROCEDURES  Procedure(s) performed (including Critical Care):  Procedures   ____________________________________________   INITIAL IMPRESSION / ASSESSMENT AND PLAN / ED COURSE  As part of my medical decision making, I reviewed the following data within the [place] History obtained from family, Nursing notes reviewed and incorporated, EKG interpreted, Old chart reviewed, Radiograph reviewed and Notes from prior ED visits     87 year old male on Eliquis presenting status post mechanical fall striking posterior head.  Differential diagnosis includes but is not limited to [place], SDH, SAH, contusion, coccyx fracture, hip fracture, etc.  CT head negative for intracranial hemorrhage.  No external bruising or signs of injury to left  buttock.  Patient ambulatory after fall without hip pain.  Tylenol given for head pain.  Strict return precautions given.  Patient and daughter verbalized understanding and agree with plan of care.      ____________________________________________   FINAL CLINICAL IMPRESSION(S) / ED DIAGNOSES  Final diagnoses:  Fall, initial encounter  Injury of head, initial encounter     ED Discharge Orders    None      *Please note:  [name] was evaluated in Emergency Department on 04/02/2021 for the symptoms described in the history of present illness. He was evaluated in the context of the global COVID-19 pandemic, which necessitated consideration that the patient might be at risk for infection with the SARS-CoV-2 virus that causes COVID-19. Institutional protocols and algorithms that pertain to the evaluation of patients at risk for COVID-19 are in a state of rapid change based on information released by regulatory bodies including the CDC and federal and state organizations. These policies and algorithms were followed during the patient's care in the ED.  Some ED evaluations and interventions may be delayed as a result of limited staffing during and the pandemic.*   Note:  This document was prepared using Dragon voice recognition software and may include unintentional dictation errors.

## 2021-04-07 ENCOUNTER — Telehealth: Payer: Self-pay

## 2021-04-07 NOTE — Telephone Encounter (Signed)
Spoke to daughter, [name]. She said he was trying to walk too fast with his walker and bunched up a rug causing him to fall. No bruising. Slight headache after it happened but doing just fine now. Thanks [place] for the call.

## 2021-04-13 ENCOUNTER — Ambulatory Visit: Payer: Medicare PPO

## 2021-04-15 ENCOUNTER — Ambulatory Visit (INDEPENDENT_AMBULATORY_CARE_PROVIDER_SITE_OTHER): Payer: Medicare PPO | Admitting: Physician Assistant

## 2021-04-15 ENCOUNTER — Ambulatory Visit: Payer: Self-pay | Admitting: Physician Assistant

## 2021-04-15 ENCOUNTER — Other Ambulatory Visit: Payer: Self-pay

## 2021-04-15 DIAGNOSIS — N319 Neuromuscular dysfunction of bladder, unspecified: Secondary | ICD-10-CM | POA: Diagnosis not present

## 2021-04-15 NOTE — Progress Notes (Signed)
Cath Change/ Replacement  Patient is present today for a catheter change due to urinary retention.  55ml of water was removed from the balloon, a 16FR foley cath was removed without difficulty.  Patient was cleaned and prepped in a sterile fashion with betadine and 2% lidocaine jelly was instiled into the urethra. A 16 FR foley cath was replaced into the bladder no complications were noted Urine return was noted 9ml and urine was yellow in color. The balloon was filled with 34ml of sterile water. A leg bag was attached for drainage.    Performed by: Debroah Loop, PA-C and Gaspar Cola, CMA  Follow up: Return in about 4 weeks (around 05/13/2021) for Catheter exchange.

## 2021-04-16 ENCOUNTER — Ambulatory Visit (INDEPENDENT_AMBULATORY_CARE_PROVIDER_SITE_OTHER): Payer: Medicare PPO

## 2021-04-16 VITALS — BP 103/65 | HR 62 | Temp 98.0°F | Resp 16

## 2021-04-16 DIAGNOSIS — J4540 Moderate persistent asthma, uncomplicated: Secondary | ICD-10-CM

## 2021-04-16 DIAGNOSIS — J4550 Severe persistent asthma, uncomplicated: Secondary | ICD-10-CM

## 2021-04-16 MED ORDER — SODIUM CHLORIDE 0.9 % IV SOLN: Freq: Once | INTRAVENOUS | Status: DC | PRN

## 2021-04-16 MED ORDER — METHYLPREDNISOLONE SODIUM SUCC 125 MG IJ SOLR: 125.0000 mg | Freq: Once | INTRAMUSCULAR | Status: DC | PRN

## 2021-04-16 MED ORDER — EPINEPHRINE 0.3 MG/0.3ML IJ SOAJ: 0.3000 mg | Freq: Once | INTRAMUSCULAR | Status: DC | PRN

## 2021-04-16 MED ORDER — ALBUTEROL SULFATE HFA 108 (90 BASE) MCG/ACT IN AERS: 2.0000 | INHALATION_SPRAY | Freq: Once | RESPIRATORY_TRACT | Status: DC | PRN

## 2021-04-16 MED ORDER — OMALIZUMAB 75 MG/0.5ML ~~LOC~~ SOSY
375.0000 mg | PREFILLED_SYRINGE | Freq: Once | SUBCUTANEOUS | Status: AC
  Administered 2021-04-16: 375 mg via SUBCUTANEOUS

## 2021-04-16 MED ORDER — DIPHENHYDRAMINE HCL 50 MG/ML IJ SOLN: 50.0000 mg | Freq: Once | INTRAMUSCULAR | Status: DC | PRN

## 2021-04-16 MED ORDER — FAMOTIDINE IN NACL 20-0.9 MG/50ML-% IV SOLN: 20.0000 mg | Freq: Once | INTRAVENOUS | Status: DC | PRN

## 2021-04-16 NOTE — Progress Notes (Signed)
Diagnosis: Asthma  Provider:  Marshell Garfinkel, MD  Procedure: Injection  Xolair, Dose: 375 mg, Site: subcutaneous  Discharge: Condition: Good, Destination: Home . AVS provided to patient.   Performed by:  Jonelle Sidle, RN

## 2021-04-21 ENCOUNTER — Other Ambulatory Visit: Payer: Self-pay | Admitting: Family

## 2021-04-21 NOTE — Addendum Note (Signed)
Encounter addended by: Kathyrn Drown, RN on: 04/21/2021 11:26 AM  Actions taken: Charge Capture section accepted

## 2021-04-27 ENCOUNTER — Ambulatory Visit: Payer: Medicare PPO

## 2021-04-27 DIAGNOSIS — D631 Anemia in chronic kidney disease: Secondary | ICD-10-CM | POA: Diagnosis not present

## 2021-04-27 DIAGNOSIS — N182 Chronic kidney disease, stage 2 (mild): Secondary | ICD-10-CM | POA: Diagnosis not present

## 2021-04-27 DIAGNOSIS — E871 Hypo-osmolality and hyponatremia: Secondary | ICD-10-CM | POA: Diagnosis not present

## 2021-04-27 DIAGNOSIS — R600 Localized edema: Secondary | ICD-10-CM | POA: Diagnosis not present

## 2021-04-28 ENCOUNTER — Other Ambulatory Visit: Payer: Self-pay

## 2021-04-28 ENCOUNTER — Ambulatory Visit
Admission: RE | Admit: 2021-04-28 | Discharge: 2021-04-28 | Disposition: A | Discharge status: Home / Self Care | Payer: Medicare PPO | Source: Ambulatory Visit | Attending: Nephrology | Admitting: Nephrology

## 2021-04-28 DIAGNOSIS — R600 Localized edema: Secondary | ICD-10-CM | POA: Insufficient documentation

## 2021-04-28 DIAGNOSIS — N183 Chronic kidney disease, stage 3 unspecified: Secondary | ICD-10-CM | POA: Diagnosis not present

## 2021-04-28 MED ORDER — FUROSEMIDE 10 MG/ML IJ SOLN
40.0000 mg | Freq: Once | INTRAMUSCULAR | Status: AC
  Administered 2021-04-30: 40 mg via INTRAVENOUS

## 2021-04-28 MED ORDER — POTASSIUM CHLORIDE CRYS ER 20 MEQ PO TBCR: 20.0000 meq | EXTENDED_RELEASE_TABLET | Freq: Once | ORAL | Status: AC

## 2021-04-28 MED ORDER — POTASSIUM CHLORIDE CRYS ER 20 MEQ PO TBCR
20.0000 meq | EXTENDED_RELEASE_TABLET | Freq: Once | ORAL | Status: AC
  Administered 2021-04-28: 20 meq via ORAL

## 2021-04-28 MED ORDER — FUROSEMIDE 10 MG/ML IJ SOLN
INTRAMUSCULAR | Status: AC
  Filled 2021-04-28: qty 4

## 2021-04-28 MED ORDER — SODIUM CHLORIDE FLUSH 0.9 % IV SOLN
INTRAVENOUS | Status: AC
  Filled 2021-04-28: qty 20

## 2021-04-28 MED ORDER — FUROSEMIDE 10 MG/ML IJ SOLN
40.0000 mg | Freq: Once | INTRAMUSCULAR | Status: AC
  Administered 2021-04-28: 40 mg via INTRAVENOUS

## 2021-04-28 MED ORDER — POTASSIUM CHLORIDE CRYS ER 20 MEQ PO TBCR
EXTENDED_RELEASE_TABLET | ORAL | Status: AC
  Filled 2021-04-28: qty 1

## 2021-04-29 ENCOUNTER — Other Ambulatory Visit: Payer: Self-pay | Admitting: Family

## 2021-04-30 ENCOUNTER — Ambulatory Visit
Admission: RE | Admit: 2021-04-30 | Discharge: 2021-04-30 | Disposition: A | Discharge status: Home / Self Care | Payer: Medicare PPO | Source: Ambulatory Visit | Attending: Nephrology | Admitting: Nephrology

## 2021-04-30 DIAGNOSIS — N183 Chronic kidney disease, stage 3 unspecified: Secondary | ICD-10-CM | POA: Insufficient documentation

## 2021-04-30 DIAGNOSIS — R609 Edema, unspecified: Secondary | ICD-10-CM | POA: Diagnosis not present

## 2021-04-30 LAB — RENAL FUNCTION PANEL
Albumin: 3.4 g/dL — ABNORMAL LOW (ref 3.5–5.0)
Anion gap: 7 (ref 5–15)
BUN: 20 mg/dL (ref 8–23)
CO2: 29 mmol/L (ref 22–32)
Calcium: 8.3 mg/dL — ABNORMAL LOW (ref 8.9–10.3)
Chloride: 99 mmol/L (ref 98–111)
Creatinine, Ser: 1.16 mg/dL (ref 0.61–1.24)
GFR, Estimated: >60 mL/min (ref >60)
Glucose, Bld: 94 mg/dL (ref 70–99)
Phosphorus: 4.3 mg/dL (ref 2.5–4.6)
Potassium: 4.5 mmol/L (ref 3.5–5.1)
Sodium: 135 mmol/L (ref 135–145)

## 2021-04-30 MED ORDER — FUROSEMIDE 10 MG/ML IJ SOLN
INTRAMUSCULAR | Status: AC
  Filled 2021-04-30: qty 4

## 2021-04-30 MED ORDER — POTASSIUM CHLORIDE CRYS ER 20 MEQ PO TBCR
EXTENDED_RELEASE_TABLET | ORAL | Status: AC
  Administered 2021-04-30: 20 meq via ORAL
  Filled 2021-04-30: qty 1

## 2021-05-01 ENCOUNTER — Other Ambulatory Visit: Payer: Self-pay | Admitting: Internal Medicine

## 2021-05-03 ENCOUNTER — Other Ambulatory Visit: Payer: Self-pay | Admitting: Family

## 2021-05-03 ENCOUNTER — Ambulatory Visit
Admission: RE | Admit: 2021-05-03 | Discharge: 2021-05-03 | Disposition: A | Discharge status: Home / Self Care | Payer: Medicare PPO | Source: Ambulatory Visit | Attending: Nephrology | Admitting: Nephrology

## 2021-05-03 ENCOUNTER — Other Ambulatory Visit: Payer: Self-pay

## 2021-05-03 ENCOUNTER — Ambulatory Visit: Payer: Medicare PPO

## 2021-05-03 DIAGNOSIS — N183 Chronic kidney disease, stage 3 unspecified: Secondary | ICD-10-CM | POA: Insufficient documentation

## 2021-05-03 DIAGNOSIS — R600 Localized edema: Secondary | ICD-10-CM | POA: Insufficient documentation

## 2021-05-03 MED ORDER — FUROSEMIDE 10 MG/ML IJ SOLN: 40.0000 mg | Freq: Once | INTRAMUSCULAR | Status: AC

## 2021-05-03 MED ORDER — FUROSEMIDE 10 MG/ML IJ SOLN
INTRAMUSCULAR | Status: AC
  Administered 2021-05-03: 40 mg via INTRAVENOUS
  Filled 2021-05-03: qty 4

## 2021-05-03 MED ORDER — POTASSIUM CHLORIDE CRYS ER 20 MEQ PO TBCR: 20.0000 meq | EXTENDED_RELEASE_TABLET | Freq: Once | ORAL | Status: AC

## 2021-05-03 MED ORDER — POTASSIUM CHLORIDE CRYS ER 20 MEQ PO TBCR
EXTENDED_RELEASE_TABLET | ORAL | Status: AC
  Administered 2021-05-03: 20 meq via ORAL
  Filled 2021-05-03: qty 1

## 2021-05-03 NOTE — Telephone Encounter (Signed)
Patient took his last tablet last night. Please review. Patient requested this originally a week ago.

## 2021-05-03 NOTE — Telephone Encounter (Signed)
Last filled4-18-22#90 Last OV 05-11-20 No Future OV [facility] [address] [employer] [address]

## 2021-05-05 ENCOUNTER — Ambulatory Visit
Admission: RE | Admit: 2021-05-05 | Discharge: 2021-05-05 | Disposition: A | Discharge status: Home / Self Care | Payer: Medicare PPO | Source: Ambulatory Visit | Attending: Nephrology | Admitting: Nephrology

## 2021-05-05 ENCOUNTER — Other Ambulatory Visit: Payer: Self-pay

## 2021-05-05 DIAGNOSIS — R609 Edema, unspecified: Secondary | ICD-10-CM | POA: Diagnosis not present

## 2021-05-05 DIAGNOSIS — N183 Chronic kidney disease, stage 3 unspecified: Secondary | ICD-10-CM | POA: Diagnosis not present

## 2021-05-05 MED ORDER — FUROSEMIDE 10 MG/ML IJ SOLN
INTRAMUSCULAR | Status: AC
  Administered 2021-05-05: 40 mg via INTRAVENOUS
  Filled 2021-05-05: qty 4

## 2021-05-05 MED ORDER — POTASSIUM CHLORIDE CRYS ER 20 MEQ PO TBCR
EXTENDED_RELEASE_TABLET | ORAL | Status: AC
  Administered 2021-05-05: 20 meq via ORAL
  Filled 2021-05-05: qty 1

## 2021-05-05 MED ORDER — FUROSEMIDE 10 MG/ML IJ SOLN: 40.0000 mg | Freq: Once | INTRAMUSCULAR | Status: AC

## 2021-05-05 MED ORDER — POTASSIUM CHLORIDE CRYS ER 20 MEQ PO TBCR: 20.0000 meq | EXTENDED_RELEASE_TABLET | Freq: Once | ORAL | Status: AC

## 2021-05-06 ENCOUNTER — Ambulatory Visit (INDEPENDENT_AMBULATORY_CARE_PROVIDER_SITE_OTHER): Payer: Medicare PPO

## 2021-05-06 VITALS — BP 148/58 | HR 63 | Temp 98.3°F | Resp 16

## 2021-05-06 DIAGNOSIS — J4540 Moderate persistent asthma, uncomplicated: Secondary | ICD-10-CM | POA: Diagnosis not present

## 2021-05-06 DIAGNOSIS — J4550 Severe persistent asthma, uncomplicated: Secondary | ICD-10-CM | POA: Diagnosis not present

## 2021-05-06 MED ORDER — EPINEPHRINE 0.3 MG/0.3ML IJ SOAJ: 0.3000 mg | Freq: Once | INTRAMUSCULAR | Status: DC | PRN

## 2021-05-06 MED ORDER — METHYLPREDNISOLONE SODIUM SUCC 125 MG IJ SOLR: 125.0000 mg | Freq: Once | INTRAMUSCULAR | Status: DC | PRN

## 2021-05-06 MED ORDER — ALBUTEROL SULFATE HFA 108 (90 BASE) MCG/ACT IN AERS: 2.0000 | INHALATION_SPRAY | Freq: Once | RESPIRATORY_TRACT | Status: DC | PRN

## 2021-05-06 MED ORDER — SODIUM CHLORIDE 0.9 % IV SOLN: Freq: Once | INTRAVENOUS | Status: DC | PRN

## 2021-05-06 MED ORDER — FAMOTIDINE IN NACL 20-0.9 MG/50ML-% IV SOLN: 20.0000 mg | Freq: Once | INTRAVENOUS | Status: DC | PRN

## 2021-05-06 MED ORDER — DIPHENHYDRAMINE HCL 50 MG/ML IJ SOLN: 50.0000 mg | Freq: Once | INTRAMUSCULAR | Status: DC | PRN

## 2021-05-06 MED ORDER — OMALIZUMAB 75 MG/0.5ML ~~LOC~~ SOSY
375.0000 mg | PREFILLED_SYRINGE | Freq: Once | SUBCUTANEOUS | Status: AC
  Administered 2021-05-06: 375 mg via SUBCUTANEOUS

## 2021-05-06 NOTE — Progress Notes (Addendum)
Diagnosis: Asthma  Provider:  Marshell Garfinkel, MD  Procedure: Injection  Xolair (Omalizumab), Dose: 375 mg, Site: subcutaneous  Discharge: Condition: Good, Destination: Home . AVS provided to patient.   Performed by:  Jonelle Sidle, RN

## 2021-05-07 ENCOUNTER — Ambulatory Visit
Admission: RE | Admit: 2021-05-07 | Discharge: 2021-05-07 | Disposition: A | Discharge status: Home / Self Care | Payer: Medicare PPO | Source: Ambulatory Visit | Attending: Nephrology | Admitting: Nephrology

## 2021-05-07 ENCOUNTER — Other Ambulatory Visit: Payer: Self-pay

## 2021-05-07 ENCOUNTER — Encounter: Payer: Self-pay | Admitting: Pulmonary Disease

## 2021-05-07 DIAGNOSIS — N183 Chronic kidney disease, stage 3 unspecified: Secondary | ICD-10-CM | POA: Diagnosis not present

## 2021-05-07 DIAGNOSIS — R609 Edema, unspecified: Secondary | ICD-10-CM | POA: Diagnosis not present

## 2021-05-07 MED ORDER — POTASSIUM CHLORIDE CRYS ER 20 MEQ PO TBCR
EXTENDED_RELEASE_TABLET | ORAL | Status: AC
  Administered 2021-05-07: 20 meq via ORAL
  Filled 2021-05-07: qty 1

## 2021-05-07 MED ORDER — FUROSEMIDE 10 MG/ML IJ SOLN
INTRAMUSCULAR | Status: AC
  Administered 2021-05-07: 40 mg via INTRAVENOUS
  Filled 2021-05-07: qty 4

## 2021-05-07 MED ORDER — FUROSEMIDE 10 MG/ML IJ SOLN: 40.0000 mg | Freq: Once | INTRAMUSCULAR | Status: AC

## 2021-05-07 MED ORDER — POTASSIUM CHLORIDE CRYS ER 20 MEQ PO TBCR: 20.0000 meq | EXTENDED_RELEASE_TABLET | Freq: Once | ORAL | Status: AC

## 2021-05-10 ENCOUNTER — Other Ambulatory Visit: Payer: Self-pay

## 2021-05-10 ENCOUNTER — Ambulatory Visit
Admission: RE | Admit: 2021-05-10 | Discharge: 2021-05-10 | Disposition: A | Discharge status: Home / Self Care | Payer: Medicare PPO | Source: Ambulatory Visit | Attending: Nephrology | Admitting: Nephrology

## 2021-05-10 DIAGNOSIS — R609 Edema, unspecified: Secondary | ICD-10-CM | POA: Insufficient documentation

## 2021-05-10 DIAGNOSIS — N183 Chronic kidney disease, stage 3 unspecified: Secondary | ICD-10-CM | POA: Insufficient documentation

## 2021-05-10 LAB — RENAL FUNCTION PANEL
Albumin: 3.2 g/dL — ABNORMAL LOW (ref 3.5–5.0)
Anion gap: 5 (ref 5–15)
BUN: 21 mg/dL (ref 8–23)
CO2: 31 mmol/L (ref 22–32)
Calcium: 8.5 mg/dL — ABNORMAL LOW (ref 8.9–10.3)
Chloride: 99 mmol/L (ref 98–111)
Creatinine, Ser: 0.92 mg/dL (ref 0.61–1.24)
GFR, Estimated: >60 mL/min (ref >60)
Glucose, Bld: 96 mg/dL (ref 70–99)
Phosphorus: 3.9 mg/dL (ref 2.5–4.6)
Potassium: 4.7 mmol/L (ref 3.5–5.1)
Sodium: 135 mmol/L (ref 135–145)

## 2021-05-10 MED ORDER — POTASSIUM CHLORIDE CRYS ER 20 MEQ PO TBCR: 20.0000 meq | EXTENDED_RELEASE_TABLET | Freq: Once | ORAL | Status: AC

## 2021-05-10 MED ORDER — FUROSEMIDE 10 MG/ML IJ SOLN: 40.0000 mg | Freq: Once | INTRAMUSCULAR | Status: AC

## 2021-05-10 MED ORDER — POTASSIUM CHLORIDE CRYS ER 20 MEQ PO TBCR
EXTENDED_RELEASE_TABLET | ORAL | Status: AC
  Administered 2021-05-10: 20 meq via ORAL
  Filled 2021-05-10: qty 1

## 2021-05-10 MED ORDER — FUROSEMIDE 10 MG/ML IJ SOLN
INTRAMUSCULAR | Status: AC
  Administered 2021-05-10: 40 mg via INTRAVENOUS
  Filled 2021-05-10: qty 4

## 2021-05-11 ENCOUNTER — Encounter: Payer: Self-pay | Admitting: Cardiovascular Disease

## 2021-05-11 ENCOUNTER — Ambulatory Visit (INDEPENDENT_AMBULATORY_CARE_PROVIDER_SITE_OTHER): Payer: Medicare PPO | Admitting: Cardiovascular Disease

## 2021-05-11 ENCOUNTER — Ambulatory Visit: Payer: Medicare PPO

## 2021-05-11 VITALS — BP 104/60 | HR 60 | Ht 69.0 in | Wt 222.1 lb

## 2021-05-11 DIAGNOSIS — I5032 Chronic diastolic (congestive) heart failure: Secondary | ICD-10-CM

## 2021-05-11 DIAGNOSIS — I4819 Other persistent atrial fibrillation: Secondary | ICD-10-CM

## 2021-05-11 DIAGNOSIS — I350 Nonrheumatic aortic (valve) stenosis: Secondary | ICD-10-CM

## 2021-05-11 MED ORDER — FUROSEMIDE 20 MG PO TABS: 40.0000 mg | ORAL_TABLET | Freq: Every day | ORAL | 3 refills | Status: DC

## 2021-05-11 NOTE — Progress Notes (Signed)
Cardiology Office Note   Date:  05/11/2021   ID:  [name], DOB [birth year], MRN [id number]  PCP:  [name], MD  Cardiologist:   Kathlyn Sacramento, MD   Chief Complaint  Patient presents with   Other    6 month f/u c/o edema ankles/feet. Meds reviewed verbally with pt.       History of Present Illness: [name] is a 87 y.o. male who presents for a follow-up visit regarding atrial fibrillation .The patient has chronic medical conditions that include rheumatoid arthritis, history of TIA, hyperlipidemia, chronic hyponatremia due to suspected SIADH and osteoarthritis. He had an echocardiogram done in June 2019 which showed normal LV systolic function with very mild aortic stenosis and mild mitral regurgitation. He had worsening heart failure in 2020.  He was noted to be in atrial fibrillation.  Repeat echocardiogram showed normal LV systolic function with mild pulmonary hypertension.  He was switched from Plavix to Eliquis 5 mg twice daily and was also started on metoprolol 25 mg twice daily.   He was thought to have diastolic heart failure related to atrial fibrillation.  He had successful cardioversion in May of 2020.  He had right hand-assisted laparoscopic nephrectomy for kidney mass.    Most recent echocardiogram in June 2021 showed an EF of 60 to 35%, grade 2 diastolic dysfunction, mild pulmonary hypertension, mild aortic stenosis and mildly dilated aortic root. Had recent issues of hyponatremia and diastolic heart failure with weight gain and increased leg edema.  He received few doses of IV furosemide which was ordered by Dr. Holley Raring.  In spite of that, the patient's weight is still above his baseline weight with significant leg edema.  Shortness of breath is stable overall.  Past Medical History:  Diagnosis Date   (HFpEF) heart failure with preserved ejection fraction ([place])    a. 02/2019 Echo: Ef 60-65%, mildly reduced RV fxn. RVSP 33mmHg. Mildly dil LA. Mod dil RA.  Mild to mod TR. Mild to mod AS. Triv AI.   Allergy    Anemia    Anxiety    Arthritis    rheumatoid   Asthma    Basal cell carcinoma    back   CAD (coronary artery disease)    CHF (congestive heart failure) (HCC)    Chronic kidney disease    Mass right kidney   Chronic sinusitis    Collagen vascular disease (HCC)    Rheumatoid Arthritis   Depression    Diverticulitis    Dysrhythmia    ED (erectile dysfunction)    GERD (gastroesophageal reflux disease)    History of SIADH    Hx of adenomatous colonic polyps    Hypertension    Hyponatremia    IBS (irritable bowel syndrome)    Neurodermatitis    Neurogenic bladder    OSA (obstructive sleep apnea)    no CPAP since weight loss   Persistent atrial fibrillation ([place])    a. Dx 02/2019; b. CHA2DS2VASc = 6-->eliquis initiated.   Prostate cancer [facility])    Renal mass    10/2018 4.2cm R renal cortical mass. Most compatible w/ clear cell renal cell carcinoma.    Past Surgical History:  Procedure Laterality Date   CARDIOVERSION N/A 04/23/2019   Procedure: CARDIOVERSION;  Surgeon: Nelva Bush, MD;  Location: [facility] ORS;  Service: Cardiovascular;  Laterality: N/A;   KNEE ARTHROPLASTY Right 09/02/2015   Procedure: COMPUTER ASSISTED TOTAL KNEE ARTHROPLASTY;  Surgeon: [name], MD;  Location:  [facility] ORS;  Service: Orthopedics;  Laterality: Right;   LAPAROSCOPIC NEPHRECTOMY, HAND ASSISTED Right 06/17/2019   Procedure: HAND ASSISTED LAPAROSCOPIC NEPHRECTOMY;  Surgeon: Hollice Espy, MD;  Location: [facility] ORS;  Service: Urology;  Laterality: Right;   NASAL SINUS SURGERY  2009   DEVIATED SEPTUM AND POLYPS   permanent indwelling catheter     PROSTATE SURGERY     PROSTATECTOMY   TOTAL KNEE ARTHROPLASTY Left 9/15   Dr [name]   URETHRAL STRICTURE DILATATION  02-2010   Dr.Cope     Current Outpatient Medications  Medication Sig Dispense Refill   acetaminophen (TYLENOL) 500 MG tablet Take 500 mg by mouth every 8 (eight) hours as needed  for moderate pain or headache.      ALOE PO Take 400 mg by mouth daily.      ALPRAZolam (XANAX) 0.5 MG tablet TAKE 1 TABLET BY MOUTH THREE TIMES DAILY AS NEEDED FOR ANXIETY OR SLEEP 90 tablet 0   Ascorbic Acid (VITAMIN C PO) Take 1,000 mg by mouth daily.      Calcium-Magnesium-Zinc 333-133-5 MG TABS Take 1 tablet by mouth daily.     chlorhexidine (PERIDEX) 0.12 % solution 1 mL by Mouth Rinse route as needed.     Cholecalciferol (VITAMIN D3) 5000 UNITS CAPS Take 2,000 Units by mouth daily.      Coenzyme Q10 (CO Q 10) 100 MG CAPS Take 100 mg by mouth daily.      CRANBERRY SOFT PO Take 25,200 mg by mouth daily. Takes 2 capsules once a day.     ELIQUIS 5 MG TABS tablet TAKE 1 TABLET(5 MG) BY MOUTH TWICE DAILY 60 tablet 5   EPINEPHrine 0.3 mg/0.3 mL IJ SOAJ injection Inject 0.3 mg into the muscle as needed for anaphylaxis.     erythromycin ophthalmic ointment SMARTSIG:0.25 Sparingly In Eye(s) Every Night     fluticasone (FLONASE) 50 MCG/ACT nasal spray Place 2 sprays into both nostrils 2 (two) times daily. 48 g 3   folic acid (FOLVITE) 376 MCG tablet Take 1,200 mcg by mouth daily.     furosemide (LASIX) 20 MG tablet TAKE 1 TABLET(20 MG) BY MOUTH DAILY 90 tablet 1   gabapentin (NEURONTIN) 300 MG capsule Takes 1 capsule at lunch time.     gabapentin (NEURONTIN) 600 MG tablet Take 600 mg by mouth 2 (two) times daily at 8 am and 10 pm.     hydroxychloroquine (PLAQUENIL) 200 MG tablet Take 200 mg by mouth 2 (two) times daily.      ketoconazole (NIZORAL) 2 % cream Apply 1 application topically 2 (two) times daily. 30 g 0   ketoconazole (NIZORAL) 2 % shampoo      methotrexate (RHEUMATREX) 2.5 MG tablet Take 10 tablets by mouth once a week.     metoprolol tartrate (LOPRESSOR) 25 MG tablet TAKE 1/2 TABLET(12.5 MG) BY MOUTH TWICE DAILY 90 tablet 0   milk thistle 175 MG tablet Take 175 mg by mouth daily.     Multiple Vitamins-Minerals (BALANCED CARE SENIORS PO) Take 6 tablets by mouth daily. 3 tablets of  fruit and 3 tablets of vegetables daily  Balance of Nature     MYRBETRIQ 50 MG TB24 tablet TAKE 1 TABLET(50 MG) BY MOUTH DAILY 30 tablet 3   Omega-3 Fatty Acids (FISH OIL) 1200 MG CAPS Take 1,200 mg by mouth daily.     omeprazole (PRILOSEC) 20 MG capsule Take 1 capsule (20 mg total) by mouth daily as needed. 90 capsule 1   PHOSPHATIDYL CHOLINE  PO Take 1 tablet by mouth daily.     polyethylene glycol powder (GLYCOLAX/MIRALAX) 17 GM/SCOOP powder Take 17 g by mouth at bedtime.     pravastatin (PRAVACHOL) 20 MG tablet TAKE 1 TABLET(20 MG) BY MOUTH AT BEDTIME 90 tablet 3   Probiotic Product (ALIGN) 4 MG CAPS Take 1 capsule (4 mg total) by mouth daily. 15 capsule 0   Selenium 200 MCG CAPS Take 200 mcg by mouth daily.      triamcinolone ointment (KENALOG) 0.1 % Apply 1 application topically as needed.     Turmeric 500 MG CAPS Take 500 mg by mouth daily.      vitamin B-12 (CYANOCOBALAMIN) 1000 MCG tablet Take 1,000 mcg by mouth daily.     Zinc 50 MG TABS Take by mouth.     No current facility-administered medications for this visit.    Allergies:   Ciprofloxacin, Citalopram hydrobromide, Clindamycin, Lorazepam, Paroxetine, Ramipril, Simvastatin, and Sulfa antibiotics    Social History:  The patient  reports that he has never smoked. He has never used smokeless tobacco. He reports current alcohol use. He reports that he does not use drugs.   Family History:  The patient's family history includes Heart disease (age of onset: 27) in his mother; Pneumonia (age of onset: 24) in his father; Skin cancer in his father, sister, and son.    ROS:  Please see the history of present illness.   Otherwise, review of systems are positive for none.   All other systems are reviewed and negative.    PHYSICAL EXAM: VS:  BP 104/60 (BP Location: Left Arm, Patient Position: Sitting, Cuff Size: Normal)   Pulse 60   Ht 5\' 9"  (1.753 m)   Wt 222 lb 2 oz (100.8 kg)   SpO2 96%   BMI 32.80 kg/m  , BMI Body mass index  is 32.8 kg/m. GEN: Well nourished, well developed, in no acute distress  HEENT: normal  Neck: no JVD, carotid bruits, or masses Cardiac: Regular rate and rhythm; no  rubs, or gallops .  2/ 6 systolic murmur in the aortic area which is mid peaking.  Mild to moderate bilateral leg edema Respiratory:  clear to auscultation bilaterally, normal work of breathing GI: soft, nontender, nondistended, + BS MS: no deformity or atrophy  Skin: warm and dry, no rash Neuro:  Strength and sensation are intact Psych: euthymic mood, full affect   EKG:  EKG is ordered today. The ekg ordered today demonstrates normal sinus rhythm with right bundle branch block and left anterior fascicular block.  Minimal LVH.  Recent Labs: 05/11/2020: Hemoglobin 11.4; Platelets 183.0 11/05/2020: ALT 17; Magnesium 2.1 05/10/2021: BUN 21; Creatinine, Ser 0.92; Potassium 4.7; Sodium 135    Lipid Panel    Component Value Date/Time   CHOL 115 05/11/2020 1648   TRIG 60.0 05/11/2020 1648   HDL 56.50 05/11/2020 1648   CHOLHDL 2 05/11/2020 1648   VLDL 12.0 05/11/2020 1648   LDLCALC 47 05/11/2020 1648      Wt Readings from Last 3 Encounters:  05/11/21 222 lb 2 oz (100.8 kg)  04/02/21 212 lb (96.2 kg)  03/11/21 212 lb (96.2 kg)       PAD Screen 12/28/2018  Previous PAD dx? No  Previous surgical procedure? (No Data)  Pain with walking? Yes  Subsides with rest? Yes  Feet/toe relief with dangling? No  Painful, non-healing ulcers? No  Extremities discolored? Yes      ASSESSMENT AND PLAN:  1.  Persistent atrial  fibrillation: Maintaining sinus rhythm .  Continue small dose metoprolol.  He is tolerating anticoagulation with Eliquis.  Most recent labs showed normal renal function.  2.  Chronic diastolic heart failure: He seems to be volume overloaded and his weight is 10 pounds above his dry weight.  I elected to increase furosemide to 40 mg once daily.  Check basic metabolic profile in 2 weeks.  3.  Aortic  stenosis: This was mild on most recent echocardiogram in June 2021.  Repeat echocardiogram in 6 months from now.   Disposition:   FU in 6 months.  Signed,  Kathlyn Sacramento, MD  05/11/2021 4:29 PM    [place]

## 2021-05-11 NOTE — Patient Instructions (Signed)
Medication Instructions:   Your physician has recommended you make the following change in your medication:   INCREASE your Lasix to 40 MG once a day.    *If you need a refill on your cardiac medications before your next appointment, please call your pharmacy*   Lab Work:  Your physician recommends that you return for lab work in: 2 weeks  Check In at our front desk on  [address] 6/28    Testing/Procedures: None ordered   Follow-Up: At [facility], you and your health needs are our priority.  As part of our continuing mission to provide you with exceptional heart care, we have created designated Provider Care Teams.  These Care Teams include your primary Cardiologist (physician) and Advanced Practice Providers (APPs -  Physician Assistants and Nurse Practitioners) who all work together to provide you with the care you need, when you need it.  We recommend signing up for the patient portal called "MyChart".  Sign up information is provided on this After Visit Summary.  MyChart is used to connect with patients for Virtual Visits (Telemedicine).  Patients are able to view lab/test results, encounter notes, upcoming appointments, etc.  Non-urgent messages can be sent to your provider as well.   To learn more about what you can do with MyChart, go to [email or web address].    Your next appointment:   6 month(s)  The format for your next appointment:   In Person  Provider:   You may see Kathlyn Sacramento, MD or one of the following Advanced Practice Providers on your designated Care Team:   Murray Hodgkins, NP Christell Faith, PA-C Marrianne Mood, PA-C Cadence [place], [place] Laurann Montana, NP   Other Instructions

## 2021-05-12 ENCOUNTER — Encounter: Payer: Self-pay | Admitting: Pulmonary Disease

## 2021-05-13 ENCOUNTER — Other Ambulatory Visit: Payer: Self-pay | Admitting: Urology

## 2021-05-13 DIAGNOSIS — M05761 Rheumatoid arthritis with rheumatoid factor of right knee without organ or systems involvement: Secondary | ICD-10-CM | POA: Diagnosis not present

## 2021-05-13 DIAGNOSIS — Z79899 Other long term (current) drug therapy: Secondary | ICD-10-CM | POA: Diagnosis not present

## 2021-05-13 DIAGNOSIS — H2513 Age-related nuclear cataract, bilateral: Secondary | ICD-10-CM | POA: Diagnosis not present

## 2021-05-17 ENCOUNTER — Ambulatory Visit: Payer: Medicare PPO

## 2021-05-18 DIAGNOSIS — Z96653 Presence of artificial knee joint, bilateral: Secondary | ICD-10-CM | POA: Diagnosis not present

## 2021-05-18 NOTE — Addendum Note (Signed)
Addended by: Ronaldo Miyamoto on: 05/18/2021 02:24 PM   Modules accepted: Orders

## 2021-05-20 ENCOUNTER — Ambulatory Visit (INDEPENDENT_AMBULATORY_CARE_PROVIDER_SITE_OTHER): Payer: Medicare PPO | Admitting: Physician Assistant

## 2021-05-20 ENCOUNTER — Other Ambulatory Visit: Payer: Self-pay

## 2021-05-20 DIAGNOSIS — N319 Neuromuscular dysfunction of bladder, unspecified: Secondary | ICD-10-CM | POA: Diagnosis not present

## 2021-05-20 NOTE — Progress Notes (Signed)
Cath Change/ Replacement  Patient is present today for a catheter change due to urinary retention.  20ml of water was removed from the balloon, a 16FR foley cath was removed without difficulty.  Patient was cleaned and prepped in a sterile fashion with betadine and 2% lidocaine jelly was instilled into the urethra. A 16 FR foley cath was replaced into the bladder no complications were noted Urine return was noted 64ml and urine was yellow in color. The balloon was filled with 22ml of sterile water. A leg bag was attached for drainage.  Patient tolerated well.    Performed by: Debroah Loop, PA-C, Bradly Bienenstock, CMA, and Gaspar Cola, CMA  Follow up: Return in about 4 weeks (around 06/17/2021) for Catheter exchange.

## 2021-05-24 ENCOUNTER — Ambulatory Visit (INDEPENDENT_AMBULATORY_CARE_PROVIDER_SITE_OTHER): Payer: Medicare PPO | Admitting: *Deleted

## 2021-05-24 ENCOUNTER — Other Ambulatory Visit: Payer: Self-pay

## 2021-05-24 VITALS — BP 127/71 | HR 61 | Temp 98.3°F | Resp 18

## 2021-05-24 DIAGNOSIS — J4550 Severe persistent asthma, uncomplicated: Secondary | ICD-10-CM

## 2021-05-24 DIAGNOSIS — J4540 Moderate persistent asthma, uncomplicated: Secondary | ICD-10-CM

## 2021-05-24 MED ORDER — METHYLPREDNISOLONE SODIUM SUCC 125 MG IJ SOLR: 125.0000 mg | Freq: Once | INTRAMUSCULAR | Status: DC | PRN

## 2021-05-24 MED ORDER — OMALIZUMAB 150 MG/ML ~~LOC~~ SOSY
375.0000 mg | PREFILLED_SYRINGE | Freq: Once | SUBCUTANEOUS | Status: AC
  Administered 2021-05-24: 375 mg via SUBCUTANEOUS

## 2021-05-24 MED ORDER — ALBUTEROL SULFATE HFA 108 (90 BASE) MCG/ACT IN AERS: 2.0000 | INHALATION_SPRAY | Freq: Once | RESPIRATORY_TRACT | Status: DC | PRN

## 2021-05-24 MED ORDER — FAMOTIDINE IN NACL 20-0.9 MG/50ML-% IV SOLN: 20.0000 mg | Freq: Once | INTRAVENOUS | Status: DC | PRN

## 2021-05-24 MED ORDER — EPINEPHRINE 0.3 MG/0.3ML IJ SOAJ: 0.3000 mg | Freq: Once | INTRAMUSCULAR | Status: DC | PRN

## 2021-05-24 MED ORDER — SODIUM CHLORIDE 0.9 % IV SOLN: Freq: Once | INTRAVENOUS | Status: DC | PRN

## 2021-05-24 MED ORDER — DIPHENHYDRAMINE HCL 50 MG/ML IJ SOLN: 50.0000 mg | Freq: Once | INTRAMUSCULAR | Status: DC | PRN

## 2021-05-24 NOTE — Progress Notes (Signed)
Diagnosis: Asthma  Provider:  Marshell Garfinkel, MD  Procedure: Injection  Xolair (Omalizumab), Dose: 375 mg, Site: subcutaneous  Discharge: Condition: Good, Destination: Home . AVS provided to patient.   Performed by:  Oren Beckmann, RN

## 2021-05-25 ENCOUNTER — Ambulatory Visit: Payer: Self-pay

## 2021-05-25 ENCOUNTER — Other Ambulatory Visit: Payer: Medicare PPO

## 2021-05-25 DIAGNOSIS — L570 Actinic keratosis: Secondary | ICD-10-CM | POA: Diagnosis not present

## 2021-05-25 DIAGNOSIS — L910 Hypertrophic scar: Secondary | ICD-10-CM | POA: Diagnosis not present

## 2021-05-25 DIAGNOSIS — L821 Other seborrheic keratosis: Secondary | ICD-10-CM | POA: Diagnosis not present

## 2021-05-25 DIAGNOSIS — D485 Neoplasm of uncertain behavior of skin: Secondary | ICD-10-CM | POA: Diagnosis not present

## 2021-05-25 NOTE — Progress Notes (Signed)
Diagnosis: Asthma  Provider:  Marshell Garfinkel, MD  Procedure: Injection  Xolair (Omalizumab), Dose: 375 mg, Site: subcutaneous  Discharge: Condition: Good, Destination: Home . AVS provided to patient.   Performed by:  Jonelle Sidle, RN

## 2021-05-26 ENCOUNTER — Other Ambulatory Visit (INDEPENDENT_AMBULATORY_CARE_PROVIDER_SITE_OTHER): Payer: Medicare PPO

## 2021-05-26 ENCOUNTER — Other Ambulatory Visit: Payer: Self-pay

## 2021-05-26 DIAGNOSIS — H539 Unspecified visual disturbance: Secondary | ICD-10-CM | POA: Diagnosis not present

## 2021-05-26 DIAGNOSIS — Z8673 Personal history of transient ischemic attack (TIA), and cerebral infarction without residual deficits: Secondary | ICD-10-CM | POA: Diagnosis not present

## 2021-05-26 DIAGNOSIS — I5032 Chronic diastolic (congestive) heart failure: Secondary | ICD-10-CM | POA: Diagnosis not present

## 2021-05-26 DIAGNOSIS — M5489 Other dorsalgia: Secondary | ICD-10-CM | POA: Diagnosis not present

## 2021-05-27 LAB — BASIC METABOLIC PANEL
BUN/Creatinine Ratio: 18 (ref 10–24)
BUN: 21 mg/dL (ref 8–27)
CO2: 26 mmol/L (ref 20–29)
Calcium: 8.6 mg/dL (ref 8.6–10.2)
Chloride: 94 mmol/L — ABNORMAL LOW (ref 96–106)
Creatinine, Ser: 1.14 mg/dL (ref 0.76–1.27)
Glucose: 93 mg/dL (ref 65–99)
Potassium: 4.3 mmol/L (ref 3.5–5.2)
Sodium: 135 mmol/L (ref 134–144)
eGFR: 63 mL/min/{1.73_m2} (ref >59)

## 2021-05-28 ENCOUNTER — Other Ambulatory Visit: Payer: Self-pay

## 2021-05-28 ENCOUNTER — Ambulatory Visit (INDEPENDENT_AMBULATORY_CARE_PROVIDER_SITE_OTHER): Payer: Medicare PPO | Admitting: Internal Medicine

## 2021-05-28 ENCOUNTER — Encounter: Payer: Self-pay | Admitting: Internal Medicine

## 2021-05-28 DIAGNOSIS — G479 Sleep disorder, unspecified: Secondary | ICD-10-CM | POA: Diagnosis not present

## 2021-05-28 NOTE — Progress Notes (Signed)
Subjective:    Patient ID: [name], male    DOB: 1934-10-12, 87 y.o.   MRN: [id number]  HPI Here for ER follow up and some sleep problems Daughter [name] is here This visit occurred during the SARS-CoV-2 public health emergency.  Safety protocols were in place, including screening questions prior to the visit, additional usage of staff PPE, and extensive cleaning of exam room while observing appropriate contact time as indicated for disinfecting solutions.   Feels that he needs PT Recently saw Dr Elder Love he has referred for PT [facility] almost 2 months ago---tripped on rug with walker (fell onto buttock but was checked due to eliquis). The rug has now been removed No problems about this (fairly minor)  Having trouble at night "Xanax not working as well as it used [employer] Dr Melrose Nakayama 2 days ago---told him he has had some problems Needs the xanax to initiate sleep--but then awakens 4AM and needs another Then doesn't stay asleep--second one not that effective Dr Melrose Nakayama suggested trazodone--got Rx but hasn't started (told to start at 25mg )  Uses the xanax before catheter changes--relaxes him for this Will get "down" at times--and uses it otherwise  Current Outpatient Medications on File Prior to Visit  Medication Sig Dispense Refill   acetaminophen (TYLENOL) 500 MG tablet Take 500 mg by mouth every 8 (eight) hours as needed for moderate pain or headache.      ALOE PO Take 400 mg by mouth daily.      ALPRAZolam (XANAX) 0.5 MG tablet TAKE 1 TABLET BY MOUTH THREE TIMES DAILY AS NEEDED FOR ANXIETY OR SLEEP 90 tablet 0   Ascorbic Acid (VITAMIN C PO) Take 1,000 mg by mouth daily.      Calcium-Magnesium-Zinc 333-133-5 MG TABS Take 1 tablet by mouth daily.     chlorhexidine (PERIDEX) 0.12 % solution 1 mL by Mouth Rinse route as needed.     Cholecalciferol (VITAMIN D3) 5000 UNITS CAPS Take 2,000 Units by mouth daily.      Coenzyme Q10 (CO Q 10) 100 MG CAPS Take 100 mg by mouth daily.       CRANBERRY SOFT PO Take 25,200 mg by mouth daily. Takes 2 capsules once a day.     ELIQUIS 5 MG TABS tablet TAKE 1 TABLET(5 MG) BY MOUTH TWICE DAILY 60 tablet 5   EPINEPHrine 0.3 mg/0.3 mL IJ SOAJ injection Inject 0.3 mg into the muscle as needed for anaphylaxis.     erythromycin ophthalmic ointment SMARTSIG:0.25 Sparingly In Eye(s) Every Night     fluticasone (FLONASE) 50 MCG/ACT nasal spray Place 2 sprays into both nostrils 2 (two) times daily. 48 g 3   folic acid (FOLVITE) 341 MCG tablet Take 1,200 mcg by mouth daily.     furosemide (LASIX) 20 MG tablet Take 2 tablets (40 mg total) by mouth daily. 90 tablet 3   gabapentin (NEURONTIN) 300 MG capsule Takes 1 capsule at lunch time.     gabapentin (NEURONTIN) 600 MG tablet Take 600 mg by mouth 2 (two) times daily at 8 am and 10 pm.     hydroxychloroquine (PLAQUENIL) 200 MG tablet Take 200 mg by mouth 2 (two) times daily.      ketoconazole (NIZORAL) 2 % cream Apply 1 application topically 2 (two) times daily. 30 g 0   ketoconazole (NIZORAL) 2 % shampoo      methotrexate (RHEUMATREX) 2.5 MG tablet Take 10 tablets by mouth once a week.     metoprolol tartrate (LOPRESSOR) 25  MG tablet TAKE 1/2 TABLET(12.5 MG) BY MOUTH TWICE DAILY 90 tablet 0   milk thistle 175 MG tablet Take 175 mg by mouth daily.     Multiple Vitamins-Minerals (BALANCED CARE SENIORS PO) Take 6 tablets by mouth daily. 3 tablets of fruit and 3 tablets of vegetables daily  Balance of Nature     MYRBETRIQ 50 MG TB24 tablet TAKE 1 TABLET(50 MG) BY MOUTH DAILY 30 tablet 3   Omega-3 Fatty Acids (FISH OIL) 1200 MG CAPS Take 1,200 mg by mouth daily.     omeprazole (PRILOSEC) 20 MG capsule Take 1 capsule (20 mg total) by mouth daily as needed. 90 capsule 1   PHOSPHATIDYL CHOLINE PO Take 1 tablet by mouth daily.     polyethylene glycol powder (GLYCOLAX/MIRALAX) 17 GM/SCOOP powder Take 17 g by mouth at bedtime.     pravastatin (PRAVACHOL) 20 MG tablet TAKE 1 TABLET(20 MG) BY MOUTH AT BEDTIME 90  tablet 3   Probiotic Product (ALIGN) 4 MG CAPS Take 1 capsule (4 mg total) by mouth daily. 15 capsule 0   Selenium 200 MCG CAPS Take 200 mcg by mouth daily.      triamcinolone ointment (KENALOG) 0.1 % Apply 1 application topically as needed.     Turmeric 500 MG CAPS Take 500 mg by mouth daily.      vitamin B-12 (CYANOCOBALAMIN) 1000 MCG tablet Take 1,000 mcg by mouth daily.     Zinc 50 MG TABS Take by mouth.     No current facility-administered medications on file prior to visit.    Allergies  Allergen Reactions   Ciprofloxacin Nausea And Vomiting    Headache   Citalopram Hydrobromide Other (See Comments)    "unknown"   Clindamycin Nausea And Vomiting   Lorazepam Other (See Comments)    Adverse reaction   Paroxetine Nausea Only   Ramipril Other (See Comments)    "unknown"   Simvastatin    Sulfa Antibiotics Other (See Comments)    Past Medical History:  Diagnosis Date   (HFpEF) heart failure with preserved ejection fraction ([place])    a. 02/2019 Echo: Ef 60-65%, mildly reduced RV fxn. RVSP 70mmHg. Mildly dil LA. Mod dil RA. Mild to mod TR. Mild to mod AS. Triv AI.   Allergy    Anemia    Anxiety    Arthritis    rheumatoid   Asthma    Basal cell carcinoma    back   CAD (coronary artery disease)    CHF (congestive heart failure) (HCC)    Chronic kidney disease    Mass right kidney   Chronic sinusitis    Collagen vascular disease (HCC)    Rheumatoid Arthritis   Depression    Diverticulitis    Dysrhythmia    ED (erectile dysfunction)    GERD (gastroesophageal reflux disease)    History of SIADH    Hx of adenomatous colonic polyps    Hypertension    Hyponatremia    IBS (irritable bowel syndrome)    Neurodermatitis    Neurogenic bladder    OSA (obstructive sleep apnea)    no CPAP since weight loss   Persistent atrial fibrillation ([place])    a. Dx 02/2019; b. CHA2DS2VASc = 6-->eliquis initiated.   Prostate cancer [facility])    Renal mass    10/2018 4.2cm R renal cortical  mass. Most compatible w/ clear cell renal cell carcinoma.    Past Surgical History:  Procedure Laterality Date   CARDIOVERSION N/A 04/23/2019  Procedure: CARDIOVERSION;  Surgeon: Nelva Bush, MD;  Location: [facility] ORS;  Service: Cardiovascular;  Laterality: N/A;   KNEE ARTHROPLASTY Right 09/02/2015   Procedure: COMPUTER ASSISTED TOTAL KNEE ARTHROPLASTY;  Surgeon: [name], MD;  Location: [facility] ORS;  Service: Orthopedics;  Laterality: Right;   LAPAROSCOPIC NEPHRECTOMY, HAND ASSISTED Right 06/17/2019   Procedure: HAND ASSISTED LAPAROSCOPIC NEPHRECTOMY;  Surgeon: Hollice Espy, MD;  Location: [facility] ORS;  Service: Urology;  Laterality: Right;   NASAL SINUS SURGERY  2009   DEVIATED SEPTUM AND POLYPS   permanent indwelling catheter     PROSTATE SURGERY     PROSTATECTOMY   TOTAL KNEE ARTHROPLASTY Left 9/15   Dr [name]   URETHRAL STRICTURE DILATATION  02-2010   Dr.Cope    Family History  Problem Relation Age of Onset   Heart disease Mother 34       heart failure   Pneumonia Father 45       pnemonia   Skin cancer Father    Skin cancer Sister    Skin cancer Son    Colon cancer Neg Hx    Esophageal cancer Neg Hx    Stomach cancer Neg Hx    Pancreatic cancer Neg Hx    Liver disease Neg Hx     Social History   Socioeconomic History   Marital status: Married    Spouse name: Not on file   Number of children: 2   Years of education: Not on file   Highest education level: Not on file  Occupational History   Occupation: Radio Licensed conveyancer    Comment: retired  Tobacco Use   Smoking status: Never   Smokeless tobacco: Never  Scientific laboratory technician Use: Never used  Substance and Sexual Activity   Alcohol use: Yes    Comment: 5 days out of 7 drinks gin    Drug use: No   Sexual activity: Not Currently  Other Topics Concern   Not on file  Social History Narrative   Not sure about a living will or health care POA   Wife should make health care decisions for him--then  daughter, then son   Would accept resuscitation attempts   Not sure about tube feeds   Social Determinants of Health   Financial Resource Strain: Not on file  Food Insecurity: Not on file  Transportation Needs: Not on file  Physical Activity: Not on file  Stress: Not on file  Social Connections: Not on file  Intimate Partner Violence: Not on file   Review of Systems Asks about second COVID booster---discussed this Due to catheter--he has to lie on his back that gives him more back pain Has gone to pain clinic and has had 2 epidural injections--not really helpful (but doesn't feel pain when asleep)    Objective:   Physical Exam Constitutional:      Appearance: Normal appearance.  Neurological:     Mental Status: He is alert.  Psychiatric:        Mood and Affect: Mood normal.        Behavior: Behavior normal.           Assessment & Plan:

## 2021-05-28 NOTE — Assessment & Plan Note (Signed)
Discussed that the xanax is really for anxiety--not sleep It is reasonable to try trazodone and titrate up over time Okay to use the xanax as well-but hopefully will not need it regularly

## 2021-06-03 ENCOUNTER — Telehealth: Payer: Self-pay | Admitting: Cardiovascular Disease

## 2021-06-03 NOTE — Telephone Encounter (Signed)
DPR on file. Patient daughter [name] made aware of 05/26/21 lab results with verbalized understanding. Adv the pt daughter that the results were released to mychart to be reviewed.   Wellington Hampshire, MD  05/31/2021  9:51 AM EDT      Stable renal function on higher dose furosemide.

## 2021-06-03 NOTE — Telephone Encounter (Signed)
Daughter calling to review lab results for 6/29

## 2021-06-04 DIAGNOSIS — L821 Other seborrheic keratosis: Secondary | ICD-10-CM | POA: Diagnosis not present

## 2021-06-07 ENCOUNTER — Ambulatory Visit: Payer: Medicare PPO

## 2021-06-08 ENCOUNTER — Ambulatory Visit: Payer: Self-pay

## 2021-06-08 DIAGNOSIS — I10 Essential (primary) hypertension: Secondary | ICD-10-CM | POA: Diagnosis not present

## 2021-06-08 DIAGNOSIS — M1990 Unspecified osteoarthritis, unspecified site: Secondary | ICD-10-CM | POA: Diagnosis not present

## 2021-06-08 DIAGNOSIS — M25562 Pain in left knee: Secondary | ICD-10-CM | POA: Diagnosis not present

## 2021-06-08 DIAGNOSIS — J449 Chronic obstructive pulmonary disease, unspecified: Secondary | ICD-10-CM | POA: Diagnosis not present

## 2021-06-08 DIAGNOSIS — M519 Unspecified thoracic, thoracolumbar and lumbosacral intervertebral disc disorder: Secondary | ICD-10-CM | POA: Diagnosis not present

## 2021-06-08 DIAGNOSIS — M25561 Pain in right knee: Secondary | ICD-10-CM | POA: Diagnosis not present

## 2021-06-08 DIAGNOSIS — I4891 Unspecified atrial fibrillation: Secondary | ICD-10-CM | POA: Diagnosis not present

## 2021-06-08 DIAGNOSIS — M069 Rheumatoid arthritis, unspecified: Secondary | ICD-10-CM | POA: Diagnosis not present

## 2021-06-08 DIAGNOSIS — G8911 Acute pain due to trauma: Secondary | ICD-10-CM | POA: Diagnosis not present

## 2021-06-09 ENCOUNTER — Other Ambulatory Visit: Payer: Self-pay

## 2021-06-09 ENCOUNTER — Ambulatory Visit: Payer: Medicare PPO

## 2021-06-09 ENCOUNTER — Ambulatory Visit (INDEPENDENT_AMBULATORY_CARE_PROVIDER_SITE_OTHER): Payer: Medicare PPO | Admitting: Physician Assistant

## 2021-06-09 ENCOUNTER — Encounter: Payer: Self-pay | Admitting: Physician Assistant

## 2021-06-09 DIAGNOSIS — T839XXA Unspecified complication of genitourinary prosthetic device, implant and graft, initial encounter: Secondary | ICD-10-CM | POA: Diagnosis not present

## 2021-06-09 MED ORDER — CEFUROXIME AXETIL 250 MG PO TABS: 250.0000 mg | ORAL_TABLET | Freq: Two times a day (BID) | ORAL | 0 refills | Status: AC

## 2021-06-09 NOTE — Progress Notes (Signed)
06/09/2021 2:00 PM   OLLIS DAUDELIN 1934-10-02 [id number]  CC: Chief Complaint  Patient presents with   Urinary Retention   HPI: [name] is a 87 y.o. male with PMH RCC s/p right nephrectomy, prostate cancer s/p radical prostatectomy and salvage radiation with a known small capacity bladder on Myrbetriq, chronic indwelling Foley catheter, and A. fib on Eliquis who presents today for evaluation of possible traumatic Foley pull.   Today he reports accidentally sliding down the side of his bed this morning.  He denies hitting his head or losing consciousness associated with this fall.  He does not think the catheter got snagged on anything, but states he saw some gross hematuria immediately following.  Notably, his Lasix was increased 5 weeks ago and he has noticed significant increase in his urinary leakage around his tubing as well as constant bladder pain x3 days.   PMH: Past Medical History:  Diagnosis Date   (HFpEF) heart failure with preserved ejection fraction ([place])    a. 02/2019 Echo: Ef 60-65%, mildly reduced RV fxn. RVSP 28mmHg. Mildly dil LA. Mod dil RA. Mild to mod TR. Mild to mod AS. Triv AI.   Allergy    Anemia    Anxiety    Arthritis    rheumatoid   Asthma    Basal cell carcinoma    back   CAD (coronary artery disease)    CHF (congestive heart failure) (HCC)    Chronic kidney disease    Mass right kidney   Chronic sinusitis    Collagen vascular disease (HCC)    Rheumatoid Arthritis   Depression    Diverticulitis    Dysrhythmia    ED (erectile dysfunction)    GERD (gastroesophageal reflux disease)    History of SIADH    Hx of adenomatous colonic polyps    Hypertension    Hyponatremia    IBS (irritable bowel syndrome)    Neurodermatitis    Neurogenic bladder    OSA (obstructive sleep apnea)    no CPAP since weight loss   Persistent atrial fibrillation ([place])    a. Dx 02/2019; b. CHA2DS2VASc = 6-->eliquis initiated.   Prostate cancer [facility])    Renal  mass    10/2018 4.2cm R renal cortical mass. Most compatible w/ clear cell renal cell carcinoma.    Surgical History: Past Surgical History:  Procedure Laterality Date   CARDIOVERSION N/A 04/23/2019   Procedure: CARDIOVERSION;  Surgeon: Nelva Bush, MD;  Location: [facility] ORS;  Service: Cardiovascular;  Laterality: N/A;   KNEE ARTHROPLASTY Right 09/02/2015   Procedure: COMPUTER ASSISTED TOTAL KNEE ARTHROPLASTY;  Surgeon: [name], MD;  Location: [facility] ORS;  Service: Orthopedics;  Laterality: Right;   LAPAROSCOPIC NEPHRECTOMY, HAND ASSISTED Right 06/17/2019   Procedure: HAND ASSISTED LAPAROSCOPIC NEPHRECTOMY;  Surgeon: Hollice Espy, MD;  Location: [facility] ORS;  Service: Urology;  Laterality: Right;   NASAL SINUS SURGERY  2009   DEVIATED SEPTUM AND POLYPS   permanent indwelling catheter     PROSTATE SURGERY     PROSTATECTOMY   TOTAL KNEE ARTHROPLASTY Left 9/15   Dr [name]   URETHRAL STRICTURE DILATATION  02-2010   Dr.Cope    Home Medications:  Allergies as of 06/09/2021       Reactions   Ciprofloxacin Nausea And Vomiting   Headache   Citalopram Hydrobromide Other (See Comments)   "unknown"   Clindamycin Nausea And Vomiting   Lorazepam Other (See Comments)   Adverse reaction   Paroxetine Nausea  Only   Ramipril Other (See Comments)   "unknown"   Simvastatin    Sulfa Antibiotics Other (See Comments)        Medication List        Accurate as of June 09, 2021  2:00 PM. If you have any questions, ask your nurse or doctor.          acetaminophen 500 MG tablet Commonly known as: TYLENOL Take 500 mg by mouth every 8 (eight) hours as needed for moderate pain or headache.   Align 4 MG Caps Take 1 capsule (4 mg total) by mouth daily.   ALOE PO Take 400 mg by mouth daily.   ALPRAZolam 0.5 MG tablet Commonly known as: XANAX TAKE 1 TABLET BY MOUTH THREE TIMES DAILY AS NEEDED FOR ANXIETY OR SLEEP   BALANCED CARE SENIORS PO Take 6 tablets by mouth daily. 3 tablets  of fruit and 3 tablets of vegetables daily  Balance of Nature   Calcium-Magnesium-Zinc 333-133-5 MG Tabs Take 1 tablet by mouth daily.   chlorhexidine 0.12 % solution Commonly known as: PERIDEX 1 mL by Mouth Rinse route as needed.   Co Q 10 100 MG Caps Take 100 mg by mouth daily.   CRANBERRY SOFT PO Take 25,200 mg by mouth daily. Takes 2 capsules once a day.   Eliquis 5 MG Tabs tablet Generic drug: apixaban TAKE 1 TABLET(5 MG) BY MOUTH TWICE DAILY   EPINEPHrine 0.3 mg/0.3 mL Soaj injection Commonly known as: EPI-PEN Inject 0.3 mg into the muscle as needed for anaphylaxis.   erythromycin ophthalmic ointment SMARTSIG:0.25 Sparingly In Eye(s) Every Night   Fish Oil 1200 MG Caps Take 1,200 mg by mouth daily.   fluticasone 50 MCG/ACT nasal spray Commonly known as: FLONASE Place 2 sprays into both nostrils 2 (two) times daily.   folic acid 546 MCG tablet Commonly known as: FOLVITE Take 1,200 mcg by mouth daily.   furosemide 20 MG tablet Commonly known as: LASIX Take 2 tablets (40 mg total) by mouth daily.   gabapentin 300 MG capsule Commonly known as: NEURONTIN Takes 1 capsule at lunch time.   gabapentin 600 MG tablet Commonly known as: NEURONTIN Take 600 mg by mouth 2 (two) times daily at 8 am and 10 pm.   hydroxychloroquine 200 MG tablet Commonly known as: PLAQUENIL Take 200 mg by mouth 2 (two) times daily.   ketoconazole 2 % shampoo Commonly known as: NIZORAL   ketoconazole 2 % cream Commonly known as: NIZORAL Apply 1 application topically 2 (two) times daily.   methotrexate 2.5 MG tablet Commonly known as: RHEUMATREX Take 10 tablets by mouth once a week.   metoprolol tartrate 25 MG tablet Commonly known as: LOPRESSOR TAKE 1/2 TABLET(12.5 MG) BY MOUTH TWICE DAILY   milk thistle 175 MG tablet Take 175 mg by mouth daily.   Myrbetriq 50 MG Tb24 tablet Generic drug: mirabegron ER TAKE 1 TABLET(50 MG) BY MOUTH DAILY   omeprazole 20 MG  capsule Commonly known as: PRILOSEC Take 1 capsule (20 mg total) by mouth daily as needed.   PHOSPHATIDYL CHOLINE PO Take 1 tablet by mouth daily.   polyethylene glycol powder 17 GM/SCOOP powder Commonly known as: GLYCOLAX/MIRALAX Take 17 g by mouth at bedtime.   pravastatin 20 MG tablet Commonly known as: PRAVACHOL TAKE 1 TABLET(20 MG) BY MOUTH AT BEDTIME   Selenium 200 MCG Caps Take 200 mcg by mouth daily.   triamcinolone ointment 0.1 % Commonly known as: KENALOG Apply 1 application topically as  needed.   Turmeric 500 MG Caps Take 500 mg by mouth daily.   vitamin B-12 1000 MCG tablet Commonly known as: CYANOCOBALAMIN Take 1,000 mcg by mouth daily.   VITAMIN C PO Take 1,000 mg by mouth daily.   Vitamin D3 125 MCG (5000 UT) Caps Take 2,000 Units by mouth daily.   Zinc 50 MG Tabs Take by mouth.        Allergies:  Allergies  Allergen Reactions   Ciprofloxacin Nausea And Vomiting    Headache   Citalopram Hydrobromide Other (See Comments)    "unknown"   Clindamycin Nausea And Vomiting   Lorazepam Other (See Comments)    Adverse reaction   Paroxetine Nausea Only   Ramipril Other (See Comments)    "unknown"   Simvastatin    Sulfa Antibiotics Other (See Comments)    Family History: Family History  Problem Relation Age of Onset   Heart disease Mother 81       heart failure   Pneumonia Father 80       pnemonia   Skin cancer Father    Skin cancer Sister    Skin cancer Son    Colon cancer Neg Hx    Esophageal cancer Neg Hx    Stomach cancer Neg Hx    Pancreatic cancer Neg Hx    Liver disease Neg Hx     Social History:   reports that he has never smoked. He has never used smokeless tobacco. He reports current alcohol use. He reports that he does not use drugs.  Physical Exam: There were no vitals taken for this visit.  Constitutional:  Alert and oriented, no acute distress, nontoxic appearing HEENT: [place], AT Cardiovascular: No clubbing, cyanosis,  or edema Respiratory: Normal respiratory effort, no increased work of breathing GU: Foley catheter in place draining clear, yellow urine Skin: No rashes, bruises or suspicious lesions Neurologic: Grossly intact, no focal deficits, moving all 4 extremities Psychiatric: Normal mood and affect  Assessment & Plan:   1. Foley catheter problem, initial encounter ([place]) Foley catheter is draining well on presentation today. In the absence of gross hematuria, low concern for dislodged Foley catheter. Given increased, constant bladder pain x3 days with increasing leakage, I do think it would be reasonable to treat him empirically for bladder infection. Will start cefuroxime and send a urine culture for further evaluation.  I manipulated his catheter today in clinic to ensure appropriate placement. First, I cleaned his meatus with betadine swab. I then injected 2% lidocaine jelly into the meatus alongside his indwelling Foley. I then deflated his Foley balloon of 10cc water, advanced the catheter to the hub, and reinflated the balloon. Patient tolerated well.  Urine sample obtained from a new leg bag in clinic after catheter manipulation. - CULTURE, URINE COMPREHENSIVE - cefUROXime (CEFTIN) 250 MG tablet; Take 1 tablet (250 mg total) by mouth 2 (two) times daily with a meal for 7 days.  Dispense: 14 tablet; Refill: 0   Return in about 1 week (around 06/16/2021) for previously scheduled monthly catheter change.  Debroah Loop, PA-C  [facility] [address] [place], [place] [address] [phone]

## 2021-06-11 ENCOUNTER — Other Ambulatory Visit: Payer: Self-pay

## 2021-06-11 ENCOUNTER — Ambulatory Visit (INDEPENDENT_AMBULATORY_CARE_PROVIDER_SITE_OTHER): Payer: Medicare PPO

## 2021-06-11 ENCOUNTER — Telehealth: Payer: Self-pay

## 2021-06-11 VITALS — BP 134/80 | HR 58 | Temp 98.2°F | Resp 18

## 2021-06-11 DIAGNOSIS — I10 Essential (primary) hypertension: Secondary | ICD-10-CM | POA: Diagnosis not present

## 2021-06-11 DIAGNOSIS — I4891 Unspecified atrial fibrillation: Secondary | ICD-10-CM | POA: Diagnosis not present

## 2021-06-11 DIAGNOSIS — M25562 Pain in left knee: Secondary | ICD-10-CM | POA: Diagnosis not present

## 2021-06-11 DIAGNOSIS — G8911 Acute pain due to trauma: Secondary | ICD-10-CM | POA: Diagnosis not present

## 2021-06-11 DIAGNOSIS — J4540 Moderate persistent asthma, uncomplicated: Secondary | ICD-10-CM

## 2021-06-11 DIAGNOSIS — J4550 Severe persistent asthma, uncomplicated: Secondary | ICD-10-CM

## 2021-06-11 DIAGNOSIS — J449 Chronic obstructive pulmonary disease, unspecified: Secondary | ICD-10-CM | POA: Diagnosis not present

## 2021-06-11 DIAGNOSIS — M1990 Unspecified osteoarthritis, unspecified site: Secondary | ICD-10-CM | POA: Diagnosis not present

## 2021-06-11 DIAGNOSIS — M25561 Pain in right knee: Secondary | ICD-10-CM | POA: Diagnosis not present

## 2021-06-11 DIAGNOSIS — M519 Unspecified thoracic, thoracolumbar and lumbosacral intervertebral disc disorder: Secondary | ICD-10-CM | POA: Diagnosis not present

## 2021-06-11 DIAGNOSIS — M069 Rheumatoid arthritis, unspecified: Secondary | ICD-10-CM | POA: Diagnosis not present

## 2021-06-11 LAB — CULTURE, URINE COMPREHENSIVE

## 2021-06-11 MED ORDER — SODIUM CHLORIDE 0.9 % IV SOLN: Freq: Once | INTRAVENOUS | Status: DC | PRN

## 2021-06-11 MED ORDER — METHYLPREDNISOLONE SODIUM SUCC 125 MG IJ SOLR: 125.0000 mg | Freq: Once | INTRAMUSCULAR | Status: DC | PRN

## 2021-06-11 MED ORDER — EPINEPHRINE 0.3 MG/0.3ML IJ SOAJ: 0.3000 mg | Freq: Once | INTRAMUSCULAR | Status: DC | PRN

## 2021-06-11 MED ORDER — DIPHENHYDRAMINE HCL 50 MG/ML IJ SOLN: 50.0000 mg | Freq: Once | INTRAMUSCULAR | Status: DC | PRN

## 2021-06-11 MED ORDER — FAMOTIDINE IN NACL 20-0.9 MG/50ML-% IV SOLN: 20.0000 mg | Freq: Once | INTRAVENOUS | Status: DC | PRN

## 2021-06-11 MED ORDER — OMALIZUMAB 75 MG/0.5ML ~~LOC~~ SOSY
375.0000 mg | PREFILLED_SYRINGE | Freq: Once | SUBCUTANEOUS | Status: AC
  Administered 2021-06-11: 375 mg via SUBCUTANEOUS
  Filled 2021-06-11: qty 2.5

## 2021-06-11 MED ORDER — ALBUTEROL SULFATE HFA 108 (90 BASE) MCG/ACT IN AERS: 2.0000 | INHALATION_SPRAY | Freq: Once | RESPIRATORY_TRACT | Status: DC | PRN

## 2021-06-11 NOTE — Progress Notes (Signed)
Diagnosis: Asthma  Provider:  Marshell Garfinkel, MD  Procedure: Injection  Xolair (Omalizumab), Dose: 375 mg, Site: subcutaneous Abd 2 on right and 1 on left side Discharge: Condition: Good, Destination: Home . AVS provided to patient.   Performed by:  Zabdiel Dripps, Sherlon Handing, LPN

## 2021-06-11 NOTE — Telephone Encounter (Signed)
Spoke with patients wife as patient is at a doctors appt in [place]. Wife reports patient didn't take first dose of ABX until yesterday so she could not definitively say whether or not he was feeling better. Will try to reach again on Monday.

## 2021-06-11 NOTE — Telephone Encounter (Signed)
-----   Message from Debroah Loop, [place] sent at 06/11/2021  2:09 PM EDT ----- His urine culture is growing out multiple bacteria, which is a common finding for patients with indwelling catheters. If his pain is improving/improved on cefuroxime, ok to continue this and no need to repeat culture. If he's still having painful bladder spasms despite abx, recommend repeat urine culture for reevaluation. ----- Message ----- From: Interface, [facility] Lab Results In Sent: 06/11/2021   7:37 AM EDT To: Debroah Loop, PA-C

## 2021-06-13 ENCOUNTER — Other Ambulatory Visit: Payer: Self-pay | Admitting: Family

## 2021-06-14 DIAGNOSIS — N182 Chronic kidney disease, stage 2 (mild): Secondary | ICD-10-CM | POA: Diagnosis not present

## 2021-06-14 DIAGNOSIS — R600 Localized edema: Secondary | ICD-10-CM | POA: Diagnosis not present

## 2021-06-14 DIAGNOSIS — E871 Hypo-osmolality and hyponatremia: Secondary | ICD-10-CM | POA: Diagnosis not present

## 2021-06-14 DIAGNOSIS — D631 Anemia in chronic kidney disease: Secondary | ICD-10-CM | POA: Diagnosis not present

## 2021-06-15 DIAGNOSIS — I10 Essential (primary) hypertension: Secondary | ICD-10-CM | POA: Diagnosis not present

## 2021-06-15 DIAGNOSIS — I4891 Unspecified atrial fibrillation: Secondary | ICD-10-CM | POA: Diagnosis not present

## 2021-06-15 DIAGNOSIS — M519 Unspecified thoracic, thoracolumbar and lumbosacral intervertebral disc disorder: Secondary | ICD-10-CM | POA: Diagnosis not present

## 2021-06-15 DIAGNOSIS — M069 Rheumatoid arthritis, unspecified: Secondary | ICD-10-CM | POA: Diagnosis not present

## 2021-06-15 DIAGNOSIS — J449 Chronic obstructive pulmonary disease, unspecified: Secondary | ICD-10-CM | POA: Diagnosis not present

## 2021-06-15 DIAGNOSIS — G8911 Acute pain due to trauma: Secondary | ICD-10-CM | POA: Diagnosis not present

## 2021-06-15 DIAGNOSIS — M25561 Pain in right knee: Secondary | ICD-10-CM | POA: Diagnosis not present

## 2021-06-15 DIAGNOSIS — M1990 Unspecified osteoarthritis, unspecified site: Secondary | ICD-10-CM | POA: Diagnosis not present

## 2021-06-15 DIAGNOSIS — M25562 Pain in left knee: Secondary | ICD-10-CM | POA: Diagnosis not present

## 2021-06-15 NOTE — Telephone Encounter (Signed)
Patient reports he is feeling much better and back to normal. Confirmed appt for Thursday.

## 2021-06-16 NOTE — Telephone Encounter (Signed)
  LAST APPOINTMENT DATE:  Please schedule appointment if longer than 1 year  NEXT APPOINTMENT DATE:@10 /27/2022  MEDICATION: xanax   Is the patient out of medication? yes  PHARMACY: [facility] - [address]  Let patient know to contact pharmacy at the end of the day to make sure medication is ready.  Please notify patient to allow 48-72 hours to process  Encourage patient to contact the pharmacy for refills or they can request refills through [place]:   LAST REFILL:  QTY:  REFILL DATE:    OTHER COMMENTS:    Okay for refill?  Please advise

## 2021-06-16 NOTE — Telephone Encounter (Signed)
Last filled 05-03-21 #90 Last OV 05-11-20 Next OV 09-23-21 [facility] [address] [employer]

## 2021-06-17 ENCOUNTER — Other Ambulatory Visit: Payer: Self-pay

## 2021-06-17 ENCOUNTER — Ambulatory Visit (INDEPENDENT_AMBULATORY_CARE_PROVIDER_SITE_OTHER): Payer: Medicare PPO | Admitting: Physician Assistant

## 2021-06-17 DIAGNOSIS — N319 Neuromuscular dysfunction of bladder, unspecified: Secondary | ICD-10-CM | POA: Diagnosis not present

## 2021-06-17 NOTE — Progress Notes (Signed)
Cath Change/ Replacement  Patient is present today for a catheter change due to urinary retention.  21ml of water was removed from the balloon, a 16FR foley cath was removed without difficulty.  Patient was cleaned and prepped in a sterile fashion with betadine and 2% lidocaine jelly was instilled into the urethra. A 16 FR foley cath was replaced into the bladder no complications were noted Urine return was noted 10ml and urine was pink in color. The balloon was filled with 40ml of sterile water. A leg bag was attached for drainage.  Patient tolerated well.    Performed by: Debroah Loop, PA-C   Follow up: No follow-ups on file.

## 2021-06-18 ENCOUNTER — Telehealth: Payer: Self-pay

## 2021-06-18 DIAGNOSIS — M519 Unspecified thoracic, thoracolumbar and lumbosacral intervertebral disc disorder: Secondary | ICD-10-CM | POA: Diagnosis not present

## 2021-06-18 DIAGNOSIS — M25561 Pain in right knee: Secondary | ICD-10-CM | POA: Diagnosis not present

## 2021-06-18 DIAGNOSIS — I4891 Unspecified atrial fibrillation: Secondary | ICD-10-CM | POA: Diagnosis not present

## 2021-06-18 DIAGNOSIS — G8911 Acute pain due to trauma: Secondary | ICD-10-CM | POA: Diagnosis not present

## 2021-06-18 DIAGNOSIS — M069 Rheumatoid arthritis, unspecified: Secondary | ICD-10-CM | POA: Diagnosis not present

## 2021-06-18 DIAGNOSIS — I10 Essential (primary) hypertension: Secondary | ICD-10-CM | POA: Diagnosis not present

## 2021-06-18 DIAGNOSIS — J449 Chronic obstructive pulmonary disease, unspecified: Secondary | ICD-10-CM | POA: Diagnosis not present

## 2021-06-18 DIAGNOSIS — M25562 Pain in left knee: Secondary | ICD-10-CM | POA: Diagnosis not present

## 2021-06-18 DIAGNOSIS — M1990 Unspecified osteoarthritis, unspecified site: Secondary | ICD-10-CM | POA: Diagnosis not present

## 2021-06-18 NOTE — Telephone Encounter (Signed)
[place] Night - Client TELEPHONE ADVICE RECORD [facility] Patient Name: [facility] CASE Y Gender: Male DOB: Apr 12, 1934 Age: 87 Y 27 D Return Phone Number: [phone] (Primary) Address: City/ State/ Zip: [place]  [address] Client [place] Night - Client Client Site [place] Physician Viviana Simpler- MD Contact Type Call Who Is Calling Patient / Member / Family / Caregiver Call Type Triage / Clinical Caller Name Sumeet Brinks Relationship To Patient Daughter Return Phone Number [phone] (Primary) Chief Complaint Headache Reason for Call Symptomatic / Request for [place] states her Father has a catheter in which he had changed but is having pain on the back of his head near the ear. When pushing on his eye it makes the back of his head hurt, have questions? Translation No Nurse Assessment Nurse: Percell Miller, RN, Sherlie Ban Date/Time (Eastern Time): 06/17/2021 5:29:06 PM Confirm and document reason for call. If symptomatic, describe symptoms. ---Caller states her Father has an indwelling catheter in which he had changed but is having pain on the back of his head near the ear since this morning. States when you press on that area it hurts When pushing on his eye it makes the back of his head hurt, have questions? States yesterday he felt like it was a muscle type pain due to having PT and doing neck exercises yesterday. States when he was at dr for cath change he informed the nurse of the symptoms and she stated he may be dehydrated but if continues x24 hours to get it checked out. Does the patient have any new or worsening symptoms? ---Yes Will a triage be completed? ---Yes Related visit to physician within the last 2 weeks? ---No Does the PT have any chronic conditions? (i.e. diabetes, asthma, this includes High risk factors for pregnancy,  etc.) ---Yes List chronic conditions. ---indwelling catheter, CHF, arthritis, hyponatremia, one kidney, asthma Is this a behavioral health or substance abuse call? ---No PLEASE NOTE: All timestamps contained within this report are represented as [place] Standard Time. CONFIDENTIALTY NOTICE: This fax transmission is intended only for the addressee. It contains information that is legally privileged, confidential or otherwise protected from use or disclosure. If you are not the intended recipient, you are strictly prohibited from reviewing, disclosing, copying using or disseminating any of this information or taking any action in reliance on or regarding this information. If you have received this fax in error, please notify us immediately by telephone so that we can arrange for its return to [place]. Phone: [phone], Toll-Free: [phone], Fax: [phone] Page: 2 of 2 Call Id: [id number] Guidelines Guideline Title Affirmed Question Affirmed Notes Nurse Date/Time Eilene Ghazi Time) Headache [1] New headache AND [2] age [place], RN, Sherlie Ban 06/17/2021 5:33:55 PM Disp. Time Eilene Ghazi Time) Disposition Final User 06/17/2021 5:39:10 PM See PCP within 24 Hours Yes Percell Miller, RN, Sherlie Ban Caller Disagree/Comply Comply Caller Understands Yes PreDisposition Call Doctor Care Advice Given Per Guideline SEE PCP WITHIN 24 HOURS: * IF OFFICE WILL BE OPEN: You need to be examined within the next 24 hours. Call your doctor (or NP/PA) when the office opens and make an appointment. * IF OFFICE WILL BE CLOSED: You need to be seen within the next 24 hours. A clinic or an urgent care center is often a good source of care if your doctor's office is closed or you can't get an appointment. PAIN MEDICINES: * For pain relief, you can take either acetaminophen, ibuprofen,  or naproxen. * They are over-thecounter (OTC) pain drugs. You can buy them at the drugstore. * ACETAMINOPHEN - REGULAR STRENGTH TYLENOL: Take 650 mg  (two 325 mg pills) by mouth every 4 to 6 hours as needed. Each Regular Strength Tylenol pill has 325 mg of acetaminophen. The most you should take each day is 3,250 mg (10 pills a day). * ACETAMINOPHEN - EXTRA STRENGTH TYLENOL: Take 1,000 mg (two 500 mg pills) every 8 hours as needed. Each Extra Strength Tylenol pill has 500 mg of acetaminophen. The most you should take each day is 3,000 mg (6 pills a day). * IBUPROFEN (E.G., MOTRIN, ADVIL): Take 400 mg (two 200 mg pills) by mouth every 6 hours. The most you should take each day is 1,200 mg (six 200 mg pills), unless your doctor has told you to take more. CALL BACK IF: * Blurred vision or double vision occurs * Numbness or weakness of the face, arm or leg * Difficulty with speaking * You become worse CARE ADVICE given per Headache (Adult) guideline. Referrals REFERRED TO PCP OFFICE

## 2021-06-18 NOTE — Telephone Encounter (Signed)
I spoke with [name]; symptoms started on 06/16/21 after doing neck exercises with PT. Back of head hurts all the time but hurts worse when pushes on eye. Pt had catheter changed and was advised if condition did not improve should go to be evaluated. Pt told [name] when he drinks water the pain is better and pt continued to drink water and then by bedtime last night the pain was completely gone. Pt is still asleep this morning and rested during the night. [name] said no dizziness, dry mouth or abd pain. [name] said BP cuff at home is not reliable. Pt checked on pt and today pt is feeling fine today and will cb if any problems. Pt has had some pain in upper head but that is nothing different and pt told [name] that Dr Silvio Pate told pt previously that this could be caused by stress. Pt does not have dizziness, CP, SOB or vision changes. No difficulty at this time with weakness or extremity issues and no problem with speech. UC & ED precautions given and [name] voiced understanding.[name] will monitor pt condition and if changes will call office or go to UC or ED. Sending note to Dr Silvio Pate who is out of office and to Dr Diona Browner who is in office.will also send teams to DR [facility] to alert about this phone note.

## 2021-06-18 NOTE — Telephone Encounter (Signed)
Noted and agree with recommendations.  

## 2021-06-21 ENCOUNTER — Ambulatory Visit: Payer: Medicare PPO

## 2021-06-21 DIAGNOSIS — M519 Unspecified thoracic, thoracolumbar and lumbosacral intervertebral disc disorder: Secondary | ICD-10-CM | POA: Diagnosis not present

## 2021-06-21 DIAGNOSIS — M1990 Unspecified osteoarthritis, unspecified site: Secondary | ICD-10-CM | POA: Diagnosis not present

## 2021-06-21 DIAGNOSIS — M25561 Pain in right knee: Secondary | ICD-10-CM | POA: Diagnosis not present

## 2021-06-21 DIAGNOSIS — I4891 Unspecified atrial fibrillation: Secondary | ICD-10-CM | POA: Diagnosis not present

## 2021-06-21 DIAGNOSIS — M069 Rheumatoid arthritis, unspecified: Secondary | ICD-10-CM | POA: Diagnosis not present

## 2021-06-21 DIAGNOSIS — I10 Essential (primary) hypertension: Secondary | ICD-10-CM | POA: Diagnosis not present

## 2021-06-21 DIAGNOSIS — J449 Chronic obstructive pulmonary disease, unspecified: Secondary | ICD-10-CM | POA: Diagnosis not present

## 2021-06-21 DIAGNOSIS — G8911 Acute pain due to trauma: Secondary | ICD-10-CM | POA: Diagnosis not present

## 2021-06-21 DIAGNOSIS — M25562 Pain in left knee: Secondary | ICD-10-CM | POA: Diagnosis not present

## 2021-06-22 ENCOUNTER — Ambulatory Visit: Payer: Self-pay

## 2021-06-22 NOTE — Telephone Encounter (Signed)
Spoke to pt's daughter. She said his eye/head is better.

## 2021-06-23 DIAGNOSIS — H6123 Impacted cerumen, bilateral: Secondary | ICD-10-CM | POA: Diagnosis not present

## 2021-06-23 DIAGNOSIS — H6011 Cellulitis of right external ear: Secondary | ICD-10-CM | POA: Diagnosis not present

## 2021-06-24 DIAGNOSIS — M25561 Pain in right knee: Secondary | ICD-10-CM | POA: Diagnosis not present

## 2021-06-24 DIAGNOSIS — M519 Unspecified thoracic, thoracolumbar and lumbosacral intervertebral disc disorder: Secondary | ICD-10-CM | POA: Diagnosis not present

## 2021-06-24 DIAGNOSIS — M1990 Unspecified osteoarthritis, unspecified site: Secondary | ICD-10-CM | POA: Diagnosis not present

## 2021-06-24 DIAGNOSIS — M25562 Pain in left knee: Secondary | ICD-10-CM | POA: Diagnosis not present

## 2021-06-24 DIAGNOSIS — M069 Rheumatoid arthritis, unspecified: Secondary | ICD-10-CM | POA: Diagnosis not present

## 2021-06-24 DIAGNOSIS — I10 Essential (primary) hypertension: Secondary | ICD-10-CM | POA: Diagnosis not present

## 2021-06-24 DIAGNOSIS — J449 Chronic obstructive pulmonary disease, unspecified: Secondary | ICD-10-CM | POA: Diagnosis not present

## 2021-06-24 DIAGNOSIS — G8911 Acute pain due to trauma: Secondary | ICD-10-CM | POA: Diagnosis not present

## 2021-06-24 DIAGNOSIS — I4891 Unspecified atrial fibrillation: Secondary | ICD-10-CM | POA: Diagnosis not present

## 2021-06-25 ENCOUNTER — Ambulatory Visit (INDEPENDENT_AMBULATORY_CARE_PROVIDER_SITE_OTHER): Payer: Medicare PPO

## 2021-06-25 ENCOUNTER — Other Ambulatory Visit: Payer: Self-pay

## 2021-06-25 VITALS — BP 147/74 | HR 59 | Temp 97.7°F | Resp 16

## 2021-06-25 DIAGNOSIS — J4540 Moderate persistent asthma, uncomplicated: Secondary | ICD-10-CM | POA: Diagnosis not present

## 2021-06-25 DIAGNOSIS — J4550 Severe persistent asthma, uncomplicated: Secondary | ICD-10-CM | POA: Diagnosis not present

## 2021-06-25 MED ORDER — EPINEPHRINE 0.3 MG/0.3ML IJ SOAJ: 0.3000 mg | Freq: Once | INTRAMUSCULAR | Status: DC | PRN

## 2021-06-25 MED ORDER — DIPHENHYDRAMINE HCL 50 MG/ML IJ SOLN: 50.0000 mg | Freq: Once | INTRAMUSCULAR | Status: DC | PRN

## 2021-06-25 MED ORDER — OMALIZUMAB 75 MG/0.5ML ~~LOC~~ SOSY
375.0000 mg | PREFILLED_SYRINGE | Freq: Once | SUBCUTANEOUS | Status: AC
  Administered 2021-06-25: 375 mg via SUBCUTANEOUS
  Filled 2021-06-25: qty 2

## 2021-06-25 MED ORDER — METHYLPREDNISOLONE SODIUM SUCC 125 MG IJ SOLR: 125.0000 mg | Freq: Once | INTRAMUSCULAR | Status: DC | PRN

## 2021-06-25 MED ORDER — FAMOTIDINE IN NACL 20-0.9 MG/50ML-% IV SOLN: 20.0000 mg | Freq: Once | INTRAVENOUS | Status: DC | PRN

## 2021-06-25 MED ORDER — SODIUM CHLORIDE 0.9 % IV SOLN: Freq: Once | INTRAVENOUS | Status: DC | PRN

## 2021-06-25 MED ORDER — ALBUTEROL SULFATE HFA 108 (90 BASE) MCG/ACT IN AERS: 2.0000 | INHALATION_SPRAY | Freq: Once | RESPIRATORY_TRACT | Status: DC | PRN

## 2021-06-25 NOTE — Progress Notes (Signed)
Diagnosis: Asthma  Provider:  Marshell Garfinkel, MD  Procedure: Injection  Xolair (Omalizumab), Dose: 375 mg, Site: subcutaneous 2 right side of abd 1 left side of abdomen   Discharge: Condition: Good, Destination: Home . AVS provided to patient.   Performed by:  Akashdeep Chuba, Sherlon Handing, LPN

## 2021-06-28 DIAGNOSIS — M25561 Pain in right knee: Secondary | ICD-10-CM | POA: Diagnosis not present

## 2021-06-28 DIAGNOSIS — M25562 Pain in left knee: Secondary | ICD-10-CM | POA: Diagnosis not present

## 2021-06-28 DIAGNOSIS — I10 Essential (primary) hypertension: Secondary | ICD-10-CM | POA: Diagnosis not present

## 2021-06-28 DIAGNOSIS — I4891 Unspecified atrial fibrillation: Secondary | ICD-10-CM | POA: Diagnosis not present

## 2021-06-28 DIAGNOSIS — M069 Rheumatoid arthritis, unspecified: Secondary | ICD-10-CM | POA: Diagnosis not present

## 2021-06-28 DIAGNOSIS — J449 Chronic obstructive pulmonary disease, unspecified: Secondary | ICD-10-CM | POA: Diagnosis not present

## 2021-06-28 DIAGNOSIS — M519 Unspecified thoracic, thoracolumbar and lumbosacral intervertebral disc disorder: Secondary | ICD-10-CM | POA: Diagnosis not present

## 2021-06-28 DIAGNOSIS — M1990 Unspecified osteoarthritis, unspecified site: Secondary | ICD-10-CM | POA: Diagnosis not present

## 2021-06-28 DIAGNOSIS — G8911 Acute pain due to trauma: Secondary | ICD-10-CM | POA: Diagnosis not present

## 2021-07-01 DIAGNOSIS — I4891 Unspecified atrial fibrillation: Secondary | ICD-10-CM | POA: Diagnosis not present

## 2021-07-01 DIAGNOSIS — M25562 Pain in left knee: Secondary | ICD-10-CM | POA: Diagnosis not present

## 2021-07-01 DIAGNOSIS — I10 Essential (primary) hypertension: Secondary | ICD-10-CM | POA: Diagnosis not present

## 2021-07-01 DIAGNOSIS — M25561 Pain in right knee: Secondary | ICD-10-CM | POA: Diagnosis not present

## 2021-07-01 DIAGNOSIS — J449 Chronic obstructive pulmonary disease, unspecified: Secondary | ICD-10-CM | POA: Diagnosis not present

## 2021-07-01 DIAGNOSIS — G8911 Acute pain due to trauma: Secondary | ICD-10-CM | POA: Diagnosis not present

## 2021-07-01 DIAGNOSIS — M519 Unspecified thoracic, thoracolumbar and lumbosacral intervertebral disc disorder: Secondary | ICD-10-CM | POA: Diagnosis not present

## 2021-07-01 DIAGNOSIS — M069 Rheumatoid arthritis, unspecified: Secondary | ICD-10-CM | POA: Diagnosis not present

## 2021-07-01 DIAGNOSIS — M1990 Unspecified osteoarthritis, unspecified site: Secondary | ICD-10-CM | POA: Diagnosis not present

## 2021-07-05 ENCOUNTER — Ambulatory Visit: Payer: Medicare PPO

## 2021-07-06 ENCOUNTER — Ambulatory Visit: Payer: Self-pay

## 2021-07-06 DIAGNOSIS — I4891 Unspecified atrial fibrillation: Secondary | ICD-10-CM | POA: Diagnosis not present

## 2021-07-06 DIAGNOSIS — M069 Rheumatoid arthritis, unspecified: Secondary | ICD-10-CM | POA: Diagnosis not present

## 2021-07-06 DIAGNOSIS — M519 Unspecified thoracic, thoracolumbar and lumbosacral intervertebral disc disorder: Secondary | ICD-10-CM | POA: Diagnosis not present

## 2021-07-06 DIAGNOSIS — M1990 Unspecified osteoarthritis, unspecified site: Secondary | ICD-10-CM | POA: Diagnosis not present

## 2021-07-06 DIAGNOSIS — M25562 Pain in left knee: Secondary | ICD-10-CM | POA: Diagnosis not present

## 2021-07-06 DIAGNOSIS — I10 Essential (primary) hypertension: Secondary | ICD-10-CM | POA: Diagnosis not present

## 2021-07-06 DIAGNOSIS — M25561 Pain in right knee: Secondary | ICD-10-CM | POA: Diagnosis not present

## 2021-07-06 DIAGNOSIS — J449 Chronic obstructive pulmonary disease, unspecified: Secondary | ICD-10-CM | POA: Diagnosis not present

## 2021-07-06 DIAGNOSIS — G8911 Acute pain due to trauma: Secondary | ICD-10-CM | POA: Diagnosis not present

## 2021-07-08 DIAGNOSIS — H6011 Cellulitis of right external ear: Secondary | ICD-10-CM | POA: Diagnosis not present

## 2021-07-09 ENCOUNTER — Other Ambulatory Visit: Payer: Self-pay | Admitting: Cardiovascular Disease

## 2021-07-09 ENCOUNTER — Other Ambulatory Visit: Payer: Self-pay

## 2021-07-09 ENCOUNTER — Ambulatory Visit (INDEPENDENT_AMBULATORY_CARE_PROVIDER_SITE_OTHER): Payer: Medicare PPO

## 2021-07-09 VITALS — BP 120/73 | HR 58 | Temp 98.1°F | Resp 18

## 2021-07-09 DIAGNOSIS — J4550 Severe persistent asthma, uncomplicated: Secondary | ICD-10-CM | POA: Diagnosis not present

## 2021-07-09 DIAGNOSIS — J4540 Moderate persistent asthma, uncomplicated: Secondary | ICD-10-CM

## 2021-07-09 MED ORDER — METHYLPREDNISOLONE SODIUM SUCC 125 MG IJ SOLR: 125.0000 mg | Freq: Once | INTRAMUSCULAR | Status: DC | PRN

## 2021-07-09 MED ORDER — DIPHENHYDRAMINE HCL 50 MG/ML IJ SOLN: 50.0000 mg | Freq: Once | INTRAMUSCULAR | Status: DC | PRN

## 2021-07-09 MED ORDER — OMALIZUMAB 75 MG/0.5ML ~~LOC~~ SOSY
375.0000 mg | PREFILLED_SYRINGE | Freq: Once | SUBCUTANEOUS | Status: AC
  Administered 2021-07-09: 375 mg via SUBCUTANEOUS
  Filled 2021-07-09: qty 2

## 2021-07-09 MED ORDER — FAMOTIDINE IN NACL 20-0.9 MG/50ML-% IV SOLN: 20.0000 mg | Freq: Once | INTRAVENOUS | Status: DC | PRN

## 2021-07-09 MED ORDER — ALBUTEROL SULFATE HFA 108 (90 BASE) MCG/ACT IN AERS: 2.0000 | INHALATION_SPRAY | Freq: Once | RESPIRATORY_TRACT | Status: DC | PRN

## 2021-07-09 MED ORDER — EPINEPHRINE 0.3 MG/0.3ML IJ SOAJ: 0.3000 mg | Freq: Once | INTRAMUSCULAR | Status: DC | PRN

## 2021-07-09 MED ORDER — SODIUM CHLORIDE 0.9 % IV SOLN: Freq: Once | INTRAVENOUS | Status: DC | PRN

## 2021-07-09 NOTE — Telephone Encounter (Signed)
Prescription refill request for Eliquis received. Indication:afib Last office visit:arida 05/11/21 Scr:1.14 05/26/21 Age: 34mWeight:97.8kg

## 2021-07-09 NOTE — Telephone Encounter (Signed)
Refill Request.  

## 2021-07-09 NOTE — Progress Notes (Signed)
Diagnosis: Asthma  Provider:  Marshell Garfinkel, MD  Procedure: Injection  Xolair (Omalizumab), Dose: 375 mg, Site: subcutaneous  Discharge: Condition: Good, Destination: Home . AVS provided to patient.   Performed by:  Santosha Jividen, Sherlon Handing, LPN

## 2021-07-12 ENCOUNTER — Telehealth: Payer: Self-pay

## 2021-07-12 NOTE — Telephone Encounter (Signed)
Have not seen patient in almost 4 months. Needs evaluation- VV at the very least.

## 2021-07-12 NOTE — Telephone Encounter (Signed)
Pt wants a LESI we have no active order in that I can see

## 2021-07-13 DIAGNOSIS — I10 Essential (primary) hypertension: Secondary | ICD-10-CM | POA: Diagnosis not present

## 2021-07-13 DIAGNOSIS — M25561 Pain in right knee: Secondary | ICD-10-CM | POA: Diagnosis not present

## 2021-07-13 DIAGNOSIS — M069 Rheumatoid arthritis, unspecified: Secondary | ICD-10-CM | POA: Diagnosis not present

## 2021-07-13 DIAGNOSIS — G8911 Acute pain due to trauma: Secondary | ICD-10-CM | POA: Diagnosis not present

## 2021-07-13 DIAGNOSIS — M25562 Pain in left knee: Secondary | ICD-10-CM | POA: Diagnosis not present

## 2021-07-13 DIAGNOSIS — J449 Chronic obstructive pulmonary disease, unspecified: Secondary | ICD-10-CM | POA: Diagnosis not present

## 2021-07-13 DIAGNOSIS — M1990 Unspecified osteoarthritis, unspecified site: Secondary | ICD-10-CM | POA: Diagnosis not present

## 2021-07-13 DIAGNOSIS — M519 Unspecified thoracic, thoracolumbar and lumbosacral intervertebral disc disorder: Secondary | ICD-10-CM | POA: Diagnosis not present

## 2021-07-13 DIAGNOSIS — I4891 Unspecified atrial fibrillation: Secondary | ICD-10-CM | POA: Diagnosis not present

## 2021-07-13 NOTE — Telephone Encounter (Signed)
VV scheduled.

## 2021-07-19 ENCOUNTER — Ambulatory Visit: Payer: Medicare PPO

## 2021-07-19 DIAGNOSIS — M25561 Pain in right knee: Secondary | ICD-10-CM | POA: Diagnosis not present

## 2021-07-19 DIAGNOSIS — M519 Unspecified thoracic, thoracolumbar and lumbosacral intervertebral disc disorder: Secondary | ICD-10-CM | POA: Diagnosis not present

## 2021-07-19 DIAGNOSIS — I10 Essential (primary) hypertension: Secondary | ICD-10-CM | POA: Diagnosis not present

## 2021-07-19 DIAGNOSIS — M069 Rheumatoid arthritis, unspecified: Secondary | ICD-10-CM | POA: Diagnosis not present

## 2021-07-19 DIAGNOSIS — I4891 Unspecified atrial fibrillation: Secondary | ICD-10-CM | POA: Diagnosis not present

## 2021-07-19 DIAGNOSIS — G8911 Acute pain due to trauma: Secondary | ICD-10-CM | POA: Diagnosis not present

## 2021-07-19 DIAGNOSIS — M25562 Pain in left knee: Secondary | ICD-10-CM | POA: Diagnosis not present

## 2021-07-19 DIAGNOSIS — M1990 Unspecified osteoarthritis, unspecified site: Secondary | ICD-10-CM | POA: Diagnosis not present

## 2021-07-19 DIAGNOSIS — J449 Chronic obstructive pulmonary disease, unspecified: Secondary | ICD-10-CM | POA: Diagnosis not present

## 2021-07-20 ENCOUNTER — Ambulatory Visit: Payer: Self-pay

## 2021-07-26 ENCOUNTER — Ambulatory Visit (INDEPENDENT_AMBULATORY_CARE_PROVIDER_SITE_OTHER): Payer: Medicare PPO

## 2021-07-26 VITALS — BP 121/75 | HR 64 | Temp 98.0°F | Resp 18

## 2021-07-26 DIAGNOSIS — J4540 Moderate persistent asthma, uncomplicated: Secondary | ICD-10-CM | POA: Diagnosis not present

## 2021-07-26 DIAGNOSIS — J4550 Severe persistent asthma, uncomplicated: Secondary | ICD-10-CM | POA: Diagnosis not present

## 2021-07-26 MED ORDER — OMALIZUMAB 75 MG/0.5ML ~~LOC~~ SOSY
375.0000 mg | PREFILLED_SYRINGE | Freq: Once | SUBCUTANEOUS | Status: AC
  Administered 2021-07-26: 375 mg via SUBCUTANEOUS
  Filled 2021-07-26: qty 2

## 2021-07-26 MED ORDER — SODIUM CHLORIDE 0.9 % IV SOLN: Freq: Once | INTRAVENOUS | Status: DC | PRN

## 2021-07-26 MED ORDER — FAMOTIDINE IN NACL 20-0.9 MG/50ML-% IV SOLN: 20.0000 mg | Freq: Once | INTRAVENOUS | Status: DC | PRN

## 2021-07-26 MED ORDER — DIPHENHYDRAMINE HCL 50 MG/ML IJ SOLN: 50.0000 mg | Freq: Once | INTRAMUSCULAR | Status: DC | PRN

## 2021-07-26 MED ORDER — ALBUTEROL SULFATE HFA 108 (90 BASE) MCG/ACT IN AERS: 2.0000 | INHALATION_SPRAY | Freq: Once | RESPIRATORY_TRACT | Status: DC | PRN

## 2021-07-26 MED ORDER — METHYLPREDNISOLONE SODIUM SUCC 125 MG IJ SOLR: 125.0000 mg | Freq: Once | INTRAMUSCULAR | Status: DC | PRN

## 2021-07-26 MED ORDER — EPINEPHRINE 0.3 MG/0.3ML IJ SOAJ: 0.3000 mg | Freq: Once | INTRAMUSCULAR | Status: DC | PRN

## 2021-07-26 NOTE — Progress Notes (Signed)
Diagnosis: Asthma  Provider:  Marshell Garfinkel, MD  Procedure: Injection  Xolair (Omalizumab), Dose: 375 mg, Site: subcutaneous  Discharge: Condition: Good, Destination: Home . AVS provided to patient.   Performed by:  Arnoldo Morale, RN

## 2021-07-26 NOTE — Progress Notes (Signed)
Diagnosis: Asthma  Provider:  Marshell Garfinkel, MD  Procedure: Injection  Xolair (Omalizumab), Dose: 375 mg, Site: subcutaneous Abd 2 on right and 1 on left side Discharge: Condition: Good, Destination: Home . AVS provided to patient.   Performed by:  Durward Parcel, RN

## 2021-07-27 ENCOUNTER — Other Ambulatory Visit: Payer: Self-pay

## 2021-07-27 ENCOUNTER — Ambulatory Visit (INDEPENDENT_AMBULATORY_CARE_PROVIDER_SITE_OTHER): Payer: Medicare PPO | Admitting: Physician Assistant

## 2021-07-27 DIAGNOSIS — N319 Neuromuscular dysfunction of bladder, unspecified: Secondary | ICD-10-CM

## 2021-07-27 NOTE — Progress Notes (Signed)
Cath Change/ Replacement  Patient is present today for a catheter change due to urinary retention.  69m of water was removed from the balloon, a 16FR foley cath was removed without difficulty.  Patient was cleaned and prepped in a sterile fashion with betadine and 2% lidocaine jelly was instilled into the urethra. A 16 FR foley cath was replaced into the bladder no complications were noted Urine return was noted 158mand urine was yellow in color. The balloon was filled with 1023mf sterile water. A leg bag was attached for drainage.  Patient tolerated well.    Performed by: SamDebroah LoopA-C   Follow up: Return in about 4 weeks (around 08/24/2021) for Catheter exchange.

## 2021-07-29 DIAGNOSIS — H6123 Impacted cerumen, bilateral: Secondary | ICD-10-CM | POA: Diagnosis not present

## 2021-07-29 DIAGNOSIS — H60331 Swimmer's ear, right ear: Secondary | ICD-10-CM | POA: Diagnosis not present

## 2021-07-29 DIAGNOSIS — B379 Candidiasis, unspecified: Secondary | ICD-10-CM | POA: Diagnosis not present

## 2021-08-01 ENCOUNTER — Other Ambulatory Visit: Payer: Self-pay | Admitting: Internal Medicine

## 2021-08-01 ENCOUNTER — Other Ambulatory Visit: Payer: Self-pay | Admitting: Cardiovascular Disease

## 2021-08-03 ENCOUNTER — Ambulatory Visit: Payer: Self-pay

## 2021-08-03 NOTE — Telephone Encounter (Signed)
Last filled 06-17-21 #90 Last OV 05-28-21 Next OV 09-23-21 [facility] [address] [employer]

## 2021-08-09 ENCOUNTER — Other Ambulatory Visit: Payer: Self-pay

## 2021-08-09 ENCOUNTER — Ambulatory Visit (INDEPENDENT_AMBULATORY_CARE_PROVIDER_SITE_OTHER): Payer: Medicare PPO | Admitting: *Deleted

## 2021-08-09 VITALS — BP 133/75 | HR 56 | Temp 97.4°F | Resp 16

## 2021-08-09 DIAGNOSIS — J4540 Moderate persistent asthma, uncomplicated: Secondary | ICD-10-CM | POA: Diagnosis not present

## 2021-08-09 DIAGNOSIS — J4550 Severe persistent asthma, uncomplicated: Secondary | ICD-10-CM | POA: Diagnosis not present

## 2021-08-09 MED ORDER — EPINEPHRINE 0.3 MG/0.3ML IJ SOAJ: 0.3000 mg | Freq: Once | INTRAMUSCULAR | Status: DC | PRN

## 2021-08-09 MED ORDER — METHYLPREDNISOLONE SODIUM SUCC 125 MG IJ SOLR: 125.0000 mg | Freq: Once | INTRAMUSCULAR | Status: DC | PRN

## 2021-08-09 MED ORDER — FAMOTIDINE IN NACL 20-0.9 MG/50ML-% IV SOLN: 20.0000 mg | Freq: Once | INTRAVENOUS | Status: DC | PRN

## 2021-08-09 MED ORDER — DIPHENHYDRAMINE HCL 50 MG/ML IJ SOLN: 50.0000 mg | Freq: Once | INTRAMUSCULAR | Status: DC | PRN

## 2021-08-09 MED ORDER — ALBUTEROL SULFATE HFA 108 (90 BASE) MCG/ACT IN AERS: 2.0000 | INHALATION_SPRAY | Freq: Once | RESPIRATORY_TRACT | Status: DC | PRN

## 2021-08-09 MED ORDER — OMALIZUMAB 75 MG/0.5ML ~~LOC~~ SOSY
375.0000 mg | PREFILLED_SYRINGE | Freq: Once | SUBCUTANEOUS | Status: AC
  Administered 2021-08-09: 375 mg via SUBCUTANEOUS
  Filled 2021-08-09: qty 2

## 2021-08-09 MED ORDER — SODIUM CHLORIDE 0.9 % IV SOLN: Freq: Once | INTRAVENOUS | Status: DC | PRN

## 2021-08-09 NOTE — Progress Notes (Signed)
Diagnosis: Asthma  Provider:  Marshell Garfinkel, MD  Procedure: Injection  Xolair (Omalizumab), Dose: 375 mg, Site: subcutaneous  Discharge: Condition: Good, Destination: Home . AVS provided to patient.   Performed by:  Oren Beckmann, RN

## 2021-08-09 NOTE — Patient Instructions (Signed)
9

## 2021-08-10 DIAGNOSIS — Z79899 Other long term (current) drug therapy: Secondary | ICD-10-CM | POA: Diagnosis not present

## 2021-08-10 DIAGNOSIS — M0579 Rheumatoid arthritis with rheumatoid factor of multiple sites without organ or systems involvement: Secondary | ICD-10-CM | POA: Diagnosis not present

## 2021-08-10 NOTE — Progress Notes (Signed)
Patient: [name]  Service Category: E/M  Provider: Gaspar Cola, MD  DOB: [birth year]  DOS: 08/11/2021  Location: Office  MRN: [id number]  Setting: Ambulatory outpatient  Referring Provider: [name], MD  Type: Established Patient  Specialty: Interventional Pain Management  PCP: [name], MD  Location: Remote location  Delivery: TeleHealth     Virtual Encounter - Pain Management PROVIDER NOTE: Information contained herein reflects review and annotations entered in association with encounter. Interpretation of such information and data should be left to medically-trained personnel. Information provided to patient can be located elsewhere in the medical record under "Patient Instructions". Document created using STT-dictation technology, any transcriptional errors that may result from process are unintentional.    Contact & Pharmacy Preferred: [phone] Home: [phone] (home) Mobile: [phone] (mobile) E-mail: [email or web address]  [id number] Lorina Rabon, [facility] [address] [place] [address] Phone: [phone] Fax: [phone]   Pre-screening  [name] offered "in-person" vs "virtual" encounter. He indicated preferring virtual for this encounter.   Reason COVID-19*  Social distancing based on CDC and AMA recommendations.   I contacted [name] on 08/11/2021 via telephone.      I clearly identified myself as Gaspar Cola, MD. I verified that I was speaking with the correct person using two identifiers (Name: [name], and date of birth: 1934/01/01).  Consent I sought verbal advanced consent from [name] for virtual visit interactions. I informed [name] of possible security and privacy concerns, risks, and limitations associated with providing "not-in-person" medical evaluation and management services. I also informed [name] of the availability of  "in-person" appointments. Finally, I informed him that there would be a charge for the virtual visit and that he could be  personally, fully or partially, financially responsible for it. [name] expressed understanding and agreed to proceed.   Historic Elements   [name] is a 87 y.o. year old, male patient evaluated today after our last contact on 03/11/2021. [name]  has a past medical history of (HFpEF) heart failure with preserved ejection fraction ([place]), Allergy, Anemia, Anxiety, Arthritis, Asthma, Basal cell carcinoma, CAD (coronary artery disease), CHF (congestive heart failure) ([place]), Chronic kidney disease, Chronic sinusitis, Collagen vascular disease ([place]), Depression, Diverticulitis, Dysrhythmia, ED (erectile dysfunction), GERD (gastroesophageal reflux disease), History of SIADH, adenomatous colonic polyps, Hypertension, Hyponatremia, IBS (irritable bowel syndrome), Neurodermatitis, Neurogenic bladder, OSA (obstructive sleep apnea), Persistent atrial fibrillation ([place]), Prostate cancer ([place]), and Renal mass. He also  has a past surgical history that includes Prostate surgery; Nasal sinus surgery (2009); Urethra dilation (03-9162); Total knee arthroplasty (Left, 9/15); Knee Arthroplasty (Right, 09/02/2015); permanent indwelling catheter; Cardioversion (N/A, 04/23/2019); and Laparoscopic nephrectomy, hand assisted (Right, 06/17/2019). [name] has a current medication list which includes the following prescription(s): acetaminophen, aloe, alprazolam, eliquis, ascorbic acid, calcium-magnesium-zinc, chlorhexidine, vitamin d3, co q 10, cranberry, epinephrine, erythromycin, fluticasone, folic acid, furosemide, gabapentin, gabapentin, hydroxychloroquine, ketoconazole, ketoconazole, methotrexate, metoprolol tartrate, milk thistle, multiple vitamins-minerals, myrbetriq, fish oil, omeprazole, choline, polyethylene glycol powder, pravastatin, align, selenium, triamcinolone ointment, turmeric, vitamin  b-12, and zinc. He  reports that he has never smoked. He has never used smokeless tobacco. He reports current alcohol use. He reports that he does not use drugs. [name] is allergic to ciprofloxacin, citalopram hydrobromide, clindamycin, lorazepam, paroxetine, ramipril, simvastatin, and sulfa antibiotics.   HPI  Today, he is being contacted for  worsening of previously known (established) problem.  On 11/24/2020 we did a diagnostic bilateral lumbar facet block which according to our records provided him with 100% relief of the pain for the first hour after the procedure but then he went down to a 50% relief of the following 4 to 6 hours and from that point on it did not help anymore.  On 03/11/2021 he had a diagnostic midline L2-3 LESI #1 which on follow-up indicated that it provided him with 100% relief of the pain that lasted for at least 6 hours after the procedure and then it went down to a 60% improvement were he refers he had a substantial increase in his ability to ambulate and also range of motion.  At this point he indicates that some of his pain is now coming back and he would like to have the second procedure repeated.  He is on Eliquis and therefore we will need to stop his blood thinner for 3 days prior to the procedure.  This was communicated to him today.  However, I make sure to ask him not to stop it until he knows exactly when he will be scheduled for that epidural steroid injection.  He understood and agree.  Pharmacotherapy Assessment   Analgesic: None MME/day: 0 mg/day   Monitoring: [place] PMP: PDMP reviewed during this encounter.       Pharmacotherapy: No side-effects or adverse reactions reported. Compliance: No problems identified. Effectiveness: Clinically acceptable. Plan: Refer to "POC". UDS:  Summary  Date Value Ref Range Status  11/04/2020 Note  Final    Comment:    ==================================================================== Compliance Drug Analysis,  Ur ==================================================================== Specimen Alert Note: Urinary creatinine is low; ability to detect some drugs may be compromised. Interpret results with caution. (Creatinine) ==================================================================== Test                             Result       Flag       Units  Drug Present and Declared for Prescription Verification   Alprazolam                     500          EXPECTED   ng/mg creat   Alpha-hydroxyalprazolam        1265         EXPECTED   ng/mg creat    Source of alprazolam is a scheduled prescription medication. Alpha-    hydroxyalprazolam is an expected metabolite of alprazolam.    Gabapentin                     PRESENT      EXPECTED   Acetaminophen                  PRESENT      EXPECTED   Metoprolol                     PRESENT      EXPECTED ==================================================================== Test                      Result    Flag   Units      Ref Range   Creatinine              17        LL     mg/dL      >=  20 ==================================================================== Declared Medications:  The flagging and interpretation on this report are based on the  following declared medications.  Unexpected results may arise from  inaccuracies in the declared medications.   **Note: The testing scope of this panel includes these medications:   Alprazolam (Xanax)  Gabapentin (Neurontin)  Metoprolol (Lopressor)   **Note: The testing scope of this panel does not include small to  moderate amounts of these reported medications:   Acetaminophen (Tylenol)   **Note: The testing scope of this panel does not include the  following reported medications:   Alendronate (Fosamax)  Apixaban (Eliquis)  Bifidobacterium infantis (Align)  Calcium  Chlorhexidine (Peridex)  Choline  Cyanocobalamin  Epinephrine (EpiPen)  Fish Oil  Fluticasone (Flonase)  Folic Acid  Furosemide  (Lasix)  Hydroxychloroquine (Plaquenil)  Ketoconazole (Nizoral)  Magnesium  Methotrexate  Mirabegron (Myrbetriq)  Multivitamin  Omeprazole (Prilosec)  Polyethylene Glycol (MiraLAX)  Pravastatin (Pravachol)  Selenium  Sodium Chloride  Supplement  Triamcinolone (Kenalog)  Turmeric  Ubiquinone (CoQ10)  Vitamin D3  Zinc ==================================================================== For clinical consultation, please call [phone]. ====================================================================      Laboratory Chemistry Profile   Renal Lab Results  Component Value Date   BUN 21 05/26/2021   CREATININE 1.14 05/26/2021   BCR 18 05/26/2021   GFR 72.02 05/11/2020   GFRAA >60 06/22/2019   GFRNONAA >60 05/10/2021    Hepatic Lab Results  Component Value Date   AST 22 11/05/2020   ALT 17 11/05/2020   ALBUMIN 3.2 (L) 05/10/2021   ALKPHOS 51 11/05/2020   LIPASE 50 10/28/2018    Electrolytes Lab Results  Component Value Date   NA 135 05/26/2021   K 4.3 05/26/2021   CL 94 (L) 05/26/2021   CALCIUM 8.6 05/26/2021   MG 2.1 11/05/2020   PHOS 3.9 05/10/2021    Bone Lab Results  Component Value Date   25OHVITD1 45 11/05/2020   25OHVITD2 <1.0 11/05/2020   25OHVITD3 45 11/05/2020    Inflammation (CRP: Acute Phase) (ESR: Chronic Phase) Lab Results  Component Value Date   CRP 1.5 (H) 11/05/2020   ESRSEDRATE 46 (H) 11/05/2020   LATICACIDVEN 1.5 10/18/2017         Note: Above Lab results reviewed.  Imaging  CT Head Wo Contrast CLINICAL DATA:  Recent fall with headaches, initial encounter  EXAM: CT HEAD WITHOUT CONTRAST  TECHNIQUE: Contiguous axial images were obtained from the base of the skull through the vertex without intravenous contrast.  COMPARISON:  05/25/2018  FINDINGS: Brain: No evidence of acute infarction, hemorrhage, hydrocephalus, extra-axial collection or mass lesion/mass effect. Chronic atrophic and ischemic changes are  noted.  Vascular: No hyperdense vessel or unexpected calcification.  Skull: Normal. Negative for fracture or focal lesion.  Sinuses/Orbits: No acute finding.  Other: None.  IMPRESSION: Chronic atrophic and ischemic changes without acute abnormality.  Electronically Signed   By: Inez Catalina M.D.   On: 04/02/2021 23:32  Assessment  The primary encounter diagnosis was Chronic low back pain (Bilateral) w/o sciatica. Diagnoses of DDD (degenerative disc disease), lumbosacral, Grade 1 Retrolisthesis at L2-3 and L3-4, Lumbar facet hypertrophy, Abnormal MRI, lumbar spine (09/21/2020), and Chronic anticoagulation (Eliquis) were also pertinent to this visit.  Plan of Care  Problem-specific:  No problem-specific Assessment & Plan notes found for this encounter.  [name] has a current medication list which includes the following long-term medication(s): eliquis, calcium-magnesium-zinc, fluticasone, furosemide, metoprolol tartrate, myrbetriq, omeprazole, and pravastatin.  Pharmacotherapy (Medications Ordered): No orders of the defined types  were placed in this encounter.  Orders:  Orders Placed This Encounter  Procedures   Lumbar Epidural Injection    Standing Status:   Future    Standing Expiration Date:   11/10/2021    Scheduling Instructions:     Procedure: Interlaminar Lumbar Epidural Steroid injection (LESI)  L2-3     Laterality: Midline     Sedation: None required.     Timeframe: ASAA    Order Specific Question:   Where will this procedure be performed?    Answer:   [facility]   Blood Thinner Instructions to Nursing    Always make sure patient has clearance from prescribing physician to stop blood thinners for interventional therapies. If the patient requires a Lovenox-bridge therapy, make sure arrangements are made to institute it with the assistance of the PCP.    Scheduling Instructions:     Have [name] stop the Eliquis (Apixaban) x 3 days prior to  procedure or surgery.    Follow-up plan:   Return for Proc-day (T,Th), (NS), (Clinic) procedure: (ML) L2-3 LESI #2, (Blood Thinner Protocol).     Interventional Therapies  Risk  Complexity Considerations:   Eliquis anticoagulation (Stop: 3 days  Restart: 6 hours)  Plaquenil anticoagulation (Stop: 11 days)  "Fast-Track" Referral (11/04/2020) Advanced age  Prior patient of Dr. Sharlet Salina    Planned  Pending:   Diagnostic midline L2-3 LESI #2    Under consideration:   Diagnostic bilateral lumbar facet block #2  Diagnostic midline L2-3 LESI #2  Diagnostic interspinous process ligament injections (R/o kissing spine syndrome)   Completed:   Diagnostic bilateral lumbar facet MBB x1 (11/24/2020) (50% for the duration of the local anesthetic) Diagnostic/therapeutic midline L2-3 LESI x1 (03/11/2021) (100/100/60/60)    Palliative options:   Therapeutic midline L2-3 LESI #2     Recent Visits No visits were found meeting these conditions. Showing recent visits within past 90 days and meeting all other requirements Today's Visits Date Type Provider Dept  08/11/21 Telemedicine Milinda Pointer, MD [facility]  Showing today's visits and meeting all other requirements Future Appointments No visits were found meeting these conditions. Showing future appointments within next 90 days and meeting all other requirements I discussed the assessment and treatment plan with the patient. The patient was provided an opportunity to ask questions and all were answered. The patient agreed with the plan and demonstrated an understanding of the instructions.  Patient advised to call back or seek an in-person evaluation if the symptoms or condition worsens.  Duration of encounter: 15 minutes.  Note by: Gaspar Cola, MD Date: 08/11/2021; Time: 5:57 PM

## 2021-08-11 ENCOUNTER — Other Ambulatory Visit: Payer: Self-pay

## 2021-08-11 ENCOUNTER — Ambulatory Visit: Payer: Medicare PPO | Attending: Pain Medicine | Admitting: Pain Medicine

## 2021-08-11 DIAGNOSIS — R937 Abnormal findings on diagnostic imaging of other parts of musculoskeletal system: Secondary | ICD-10-CM

## 2021-08-11 DIAGNOSIS — M545 Low back pain, unspecified: Secondary | ICD-10-CM | POA: Diagnosis not present

## 2021-08-11 DIAGNOSIS — M5137 Other intervertebral disc degeneration, lumbosacral region: Secondary | ICD-10-CM

## 2021-08-11 DIAGNOSIS — Z7901 Long term (current) use of anticoagulants: Secondary | ICD-10-CM

## 2021-08-11 DIAGNOSIS — G8929 Other chronic pain: Secondary | ICD-10-CM

## 2021-08-11 DIAGNOSIS — M4310 Spondylolisthesis, site unspecified: Secondary | ICD-10-CM | POA: Diagnosis not present

## 2021-08-11 DIAGNOSIS — M47816 Spondylosis without myelopathy or radiculopathy, lumbar region: Secondary | ICD-10-CM

## 2021-08-11 NOTE — Patient Instructions (Addendum)

## 2021-08-16 DIAGNOSIS — M545 Low back pain, unspecified: Secondary | ICD-10-CM | POA: Diagnosis not present

## 2021-08-16 DIAGNOSIS — D696 Thrombocytopenia, unspecified: Secondary | ICD-10-CM | POA: Diagnosis not present

## 2021-08-16 DIAGNOSIS — Z79899 Other long term (current) drug therapy: Secondary | ICD-10-CM | POA: Diagnosis not present

## 2021-08-16 DIAGNOSIS — M0579 Rheumatoid arthritis with rheumatoid factor of multiple sites without organ or systems involvement: Secondary | ICD-10-CM | POA: Diagnosis not present

## 2021-08-16 DIAGNOSIS — G8929 Other chronic pain: Secondary | ICD-10-CM | POA: Diagnosis not present

## 2021-08-17 ENCOUNTER — Ambulatory Visit: Payer: Self-pay

## 2021-08-23 ENCOUNTER — Ambulatory Visit (INDEPENDENT_AMBULATORY_CARE_PROVIDER_SITE_OTHER): Payer: Medicare PPO | Admitting: *Deleted

## 2021-08-23 ENCOUNTER — Other Ambulatory Visit: Payer: Self-pay

## 2021-08-23 VITALS — BP 131/73 | HR 56 | Temp 97.7°F | Resp 18 | Wt 209.8 lb

## 2021-08-23 DIAGNOSIS — J4540 Moderate persistent asthma, uncomplicated: Secondary | ICD-10-CM

## 2021-08-23 DIAGNOSIS — J4550 Severe persistent asthma, uncomplicated: Secondary | ICD-10-CM | POA: Diagnosis not present

## 2021-08-23 MED ORDER — EPINEPHRINE 0.3 MG/0.3ML IJ SOAJ: 0.3000 mg | Freq: Once | INTRAMUSCULAR | Status: DC | PRN

## 2021-08-23 MED ORDER — OMALIZUMAB 75 MG/0.5ML ~~LOC~~ SOSY
375.0000 mg | PREFILLED_SYRINGE | Freq: Once | SUBCUTANEOUS | Status: AC
  Administered 2021-08-23: 375 mg via SUBCUTANEOUS

## 2021-08-23 MED ORDER — SODIUM CHLORIDE 0.9 % IV SOLN: Freq: Once | INTRAVENOUS | Status: DC | PRN

## 2021-08-23 MED ORDER — DIPHENHYDRAMINE HCL 50 MG/ML IJ SOLN: 50.0000 mg | Freq: Once | INTRAMUSCULAR | Status: DC | PRN

## 2021-08-23 MED ORDER — METHYLPREDNISOLONE SODIUM SUCC 125 MG IJ SOLR: 125.0000 mg | Freq: Once | INTRAMUSCULAR | Status: DC | PRN

## 2021-08-23 MED ORDER — ALBUTEROL SULFATE HFA 108 (90 BASE) MCG/ACT IN AERS: 2.0000 | INHALATION_SPRAY | Freq: Once | RESPIRATORY_TRACT | Status: DC | PRN

## 2021-08-23 MED ORDER — FAMOTIDINE IN NACL 20-0.9 MG/50ML-% IV SOLN: 20.0000 mg | Freq: Once | INTRAVENOUS | Status: DC | PRN

## 2021-08-23 NOTE — Progress Notes (Signed)
Diagnosis: Asthma  Provider:  Marshell Garfinkel, MD  Procedure: Injection  Xolair (Omalizumab), Dose: 375 mg, Site: subcutaneous  Discharge: Condition: Good, Destination: Home . AVS provided to patient.   Performed by:  Oren Beckmann, RN

## 2021-08-26 ENCOUNTER — Ambulatory Visit (INDEPENDENT_AMBULATORY_CARE_PROVIDER_SITE_OTHER): Payer: Medicare PPO | Admitting: Physician Assistant

## 2021-08-26 ENCOUNTER — Other Ambulatory Visit: Payer: Self-pay

## 2021-08-26 DIAGNOSIS — N319 Neuromuscular dysfunction of bladder, unspecified: Secondary | ICD-10-CM

## 2021-08-26 NOTE — Progress Notes (Signed)
Cath Change/ Replacement  Patient is present today for a catheter change due to urinary retention.  29ml of water was removed from the balloon, a 16FR foley cath was removed without difficulty.  Patient was cleaned and prepped in a sterile fashion with betadine and 2% lidocaine jelly was instilled into the urethra. A 16 FR foley cath was replaced into the bladder no complications were noted Urine return was noted 13ml and urine was yellow in color. The balloon was filled with 65ml of sterile water. A leg bag was attached for drainage.  Patient tolerated.    Performed by: Debroah Loop, PA-C, Bradly Bienenstock, CMA, and Gaspar Cola, CMA  Follow up: Return in about 4 weeks (around 09/23/2021) for Catheter exchange.

## 2021-08-31 ENCOUNTER — Ambulatory Visit: Payer: Medicare PPO | Admitting: Physician Assistant

## 2021-08-31 ENCOUNTER — Ambulatory Visit: Payer: Self-pay

## 2021-09-01 ENCOUNTER — Telehealth: Payer: Self-pay | Admitting: Internal Medicine

## 2021-09-01 NOTE — Telephone Encounter (Signed)
Pt called in wanting to know if him and hiw wife should get the new Covid Vaccine .  Please Advise [phone]

## 2021-09-01 NOTE — Telephone Encounter (Signed)
Spoke to pt's daughter per DPR. She will make sure they get the [employer] vaccine.

## 2021-09-06 ENCOUNTER — Ambulatory Visit (INDEPENDENT_AMBULATORY_CARE_PROVIDER_SITE_OTHER): Payer: Medicare PPO

## 2021-09-06 ENCOUNTER — Other Ambulatory Visit: Payer: Self-pay

## 2021-09-06 VITALS — BP 135/65 | HR 60 | Temp 97.7°F | Resp 16

## 2021-09-06 DIAGNOSIS — J4540 Moderate persistent asthma, uncomplicated: Secondary | ICD-10-CM

## 2021-09-06 DIAGNOSIS — J4550 Severe persistent asthma, uncomplicated: Secondary | ICD-10-CM | POA: Diagnosis not present

## 2021-09-06 MED ORDER — OMALIZUMAB 75 MG/0.5ML ~~LOC~~ SOSY
375.0000 mg | PREFILLED_SYRINGE | Freq: Once | SUBCUTANEOUS | Status: AC
  Administered 2021-09-06: 375 mg via SUBCUTANEOUS

## 2021-09-06 MED ORDER — FAMOTIDINE IN NACL 20-0.9 MG/50ML-% IV SOLN: 20.0000 mg | Freq: Once | INTRAVENOUS | Status: DC | PRN

## 2021-09-06 MED ORDER — SODIUM CHLORIDE 0.9 % IV SOLN: Freq: Once | INTRAVENOUS | Status: DC | PRN

## 2021-09-06 MED ORDER — EPINEPHRINE 0.3 MG/0.3ML IJ SOAJ: 0.3000 mg | Freq: Once | INTRAMUSCULAR | Status: DC | PRN

## 2021-09-06 MED ORDER — METHYLPREDNISOLONE SODIUM SUCC 125 MG IJ SOLR: 125.0000 mg | Freq: Once | INTRAMUSCULAR | Status: DC | PRN

## 2021-09-06 MED ORDER — DIPHENHYDRAMINE HCL 50 MG/ML IJ SOLN: 50.0000 mg | Freq: Once | INTRAMUSCULAR | Status: DC | PRN

## 2021-09-06 MED ORDER — ALBUTEROL SULFATE HFA 108 (90 BASE) MCG/ACT IN AERS: 2.0000 | INHALATION_SPRAY | Freq: Once | RESPIRATORY_TRACT | Status: DC | PRN

## 2021-09-06 NOTE — Progress Notes (Signed)
Diagnosis: Asthma  Provider:  Marshell Garfinkel, MD  Procedure: Injection  Xolair (Omalizumab), Dose 375mg , Site: subcutaneous  Discharge: Condition: Good, Destination: Home . AVS provided to patient.   Performed by:  Arnoldo Morale, RN

## 2021-09-08 ENCOUNTER — Other Ambulatory Visit: Payer: Self-pay | Admitting: Internal Medicine

## 2021-09-08 ENCOUNTER — Other Ambulatory Visit: Payer: Self-pay | Admitting: Urology

## 2021-09-08 NOTE — Telephone Encounter (Signed)
Last filled 08-03-21 #90 Last OV 05-28-21 Next OV 09-23-21 [facility] [address] [employer]

## 2021-09-11 ENCOUNTER — Inpatient Hospital Stay
Admission: EM | Admit: 2021-09-11 | Discharge: 2021-09-28 | DRG: 698 | Disposition: A | Discharge status: Skilled Nursing Facility | Payer: Medicare PPO | Attending: Internal Medicine | Admitting: Internal Medicine

## 2021-09-11 ENCOUNTER — Emergency Department: Payer: Medicare PPO

## 2021-09-11 ENCOUNTER — Other Ambulatory Visit: Payer: Self-pay

## 2021-09-11 DIAGNOSIS — D61818 Other pancytopenia: Secondary | ICD-10-CM | POA: Diagnosis present

## 2021-09-11 DIAGNOSIS — I5032 Chronic diastolic (congestive) heart failure: Secondary | ICD-10-CM | POA: Diagnosis not present

## 2021-09-11 DIAGNOSIS — N1831 Chronic kidney disease, stage 3a: Secondary | ICD-10-CM | POA: Diagnosis not present

## 2021-09-11 DIAGNOSIS — N189 Chronic kidney disease, unspecified: Secondary | ICD-10-CM | POA: Diagnosis not present

## 2021-09-11 DIAGNOSIS — R531 Weakness: Secondary | ICD-10-CM | POA: Insufficient documentation

## 2021-09-11 DIAGNOSIS — E669 Obesity, unspecified: Secondary | ICD-10-CM | POA: Diagnosis present

## 2021-09-11 DIAGNOSIS — D696 Thrombocytopenia, unspecified: Secondary | ICD-10-CM | POA: Diagnosis not present

## 2021-09-11 DIAGNOSIS — Z7901 Long term (current) use of anticoagulants: Secondary | ICD-10-CM

## 2021-09-11 DIAGNOSIS — E876 Hypokalemia: Secondary | ICD-10-CM | POA: Diagnosis present

## 2021-09-11 DIAGNOSIS — Z905 Acquired absence of kidney: Secondary | ICD-10-CM

## 2021-09-11 DIAGNOSIS — Z8546 Personal history of malignant neoplasm of prostate: Secondary | ICD-10-CM

## 2021-09-11 DIAGNOSIS — Z808 Family history of malignant neoplasm of other organs or systems: Secondary | ICD-10-CM

## 2021-09-11 DIAGNOSIS — I509 Heart failure, unspecified: Secondary | ICD-10-CM | POA: Diagnosis not present

## 2021-09-11 DIAGNOSIS — A4151 Sepsis due to Escherichia coli [E. coli]: Secondary | ICD-10-CM | POA: Diagnosis present

## 2021-09-11 DIAGNOSIS — Z466 Encounter for fitting and adjustment of urinary device: Secondary | ICD-10-CM | POA: Diagnosis not present

## 2021-09-11 DIAGNOSIS — I5033 Acute on chronic diastolic (congestive) heart failure: Secondary | ICD-10-CM | POA: Insufficient documentation

## 2021-09-11 DIAGNOSIS — N179 Acute kidney failure, unspecified: Secondary | ICD-10-CM | POA: Diagnosis not present

## 2021-09-11 DIAGNOSIS — E860 Dehydration: Secondary | ICD-10-CM | POA: Diagnosis present

## 2021-09-11 DIAGNOSIS — R7881 Bacteremia: Secondary | ICD-10-CM | POA: Diagnosis present

## 2021-09-11 DIAGNOSIS — F411 Generalized anxiety disorder: Secondary | ICD-10-CM | POA: Diagnosis present

## 2021-09-11 DIAGNOSIS — I4819 Other persistent atrial fibrillation: Secondary | ICD-10-CM | POA: Diagnosis present

## 2021-09-11 DIAGNOSIS — M069 Rheumatoid arthritis, unspecified: Secondary | ICD-10-CM | POA: Diagnosis not present

## 2021-09-11 DIAGNOSIS — Z23 Encounter for immunization: Secondary | ICD-10-CM | POA: Diagnosis not present

## 2021-09-11 DIAGNOSIS — R509 Fever, unspecified: Secondary | ICD-10-CM | POA: Diagnosis present

## 2021-09-11 DIAGNOSIS — Z79899 Other long term (current) drug therapy: Secondary | ICD-10-CM

## 2021-09-11 DIAGNOSIS — B9620 Unspecified Escherichia coli [E. coli] as the cause of diseases classified elsewhere: Secondary | ICD-10-CM | POA: Diagnosis not present

## 2021-09-11 DIAGNOSIS — G4700 Insomnia, unspecified: Secondary | ICD-10-CM | POA: Diagnosis present

## 2021-09-11 DIAGNOSIS — Z6835 Body mass index (BMI) 35.0-35.9, adult: Secondary | ICD-10-CM | POA: Diagnosis not present

## 2021-09-11 DIAGNOSIS — G629 Polyneuropathy, unspecified: Secondary | ICD-10-CM | POA: Diagnosis present

## 2021-09-11 DIAGNOSIS — N319 Neuromuscular dysfunction of bladder, unspecified: Secondary | ICD-10-CM | POA: Diagnosis present

## 2021-09-11 DIAGNOSIS — I480 Paroxysmal atrial fibrillation: Secondary | ICD-10-CM | POA: Diagnosis present

## 2021-09-11 DIAGNOSIS — I130 Hypertensive heart and chronic kidney disease with heart failure and stage 1 through stage 4 chronic kidney disease, or unspecified chronic kidney disease: Secondary | ICD-10-CM | POA: Diagnosis present

## 2021-09-11 DIAGNOSIS — K5909 Other constipation: Secondary | ICD-10-CM | POA: Diagnosis not present

## 2021-09-11 DIAGNOSIS — Y846 Urinary catheterization as the cause of abnormal reaction of the patient, or of later complication, without mention of misadventure at the time of the procedure: Secondary | ICD-10-CM | POA: Diagnosis present

## 2021-09-11 DIAGNOSIS — N1832 Chronic kidney disease, stage 3b: Secondary | ICD-10-CM | POA: Diagnosis not present

## 2021-09-11 DIAGNOSIS — M5137 Other intervertebral disc degeneration, lumbosacral region: Secondary | ICD-10-CM | POA: Diagnosis present

## 2021-09-11 DIAGNOSIS — T83593A Infection and inflammatory reaction due to other urinary stents, initial encounter: Principal | ICD-10-CM | POA: Diagnosis present

## 2021-09-11 DIAGNOSIS — A419 Sepsis, unspecified organism: Secondary | ICD-10-CM | POA: Diagnosis not present

## 2021-09-11 DIAGNOSIS — K219 Gastro-esophageal reflux disease without esophagitis: Secondary | ICD-10-CM | POA: Diagnosis present

## 2021-09-11 DIAGNOSIS — M0579 Rheumatoid arthritis with rheumatoid factor of multiple sites without organ or systems involvement: Secondary | ICD-10-CM | POA: Diagnosis present

## 2021-09-11 DIAGNOSIS — M6281 Muscle weakness (generalized): Secondary | ICD-10-CM | POA: Diagnosis not present

## 2021-09-11 DIAGNOSIS — D631 Anemia in chronic kidney disease: Secondary | ICD-10-CM | POA: Diagnosis present

## 2021-09-11 DIAGNOSIS — D6959 Other secondary thrombocytopenia: Secondary | ICD-10-CM | POA: Diagnosis present

## 2021-09-11 DIAGNOSIS — E785 Hyperlipidemia, unspecified: Secondary | ICD-10-CM | POA: Diagnosis not present

## 2021-09-11 DIAGNOSIS — Z8249 Family history of ischemic heart disease and other diseases of the circulatory system: Secondary | ICD-10-CM

## 2021-09-11 DIAGNOSIS — I2510 Atherosclerotic heart disease of native coronary artery without angina pectoris: Secondary | ICD-10-CM | POA: Diagnosis present

## 2021-09-11 DIAGNOSIS — Z978 Presence of other specified devices: Secondary | ICD-10-CM | POA: Diagnosis not present

## 2021-09-11 DIAGNOSIS — I959 Hypotension, unspecified: Secondary | ICD-10-CM | POA: Diagnosis not present

## 2021-09-11 DIAGNOSIS — G894 Chronic pain syndrome: Secondary | ICD-10-CM | POA: Diagnosis present

## 2021-09-11 DIAGNOSIS — K581 Irritable bowel syndrome with constipation: Secondary | ICD-10-CM | POA: Diagnosis present

## 2021-09-11 DIAGNOSIS — R278 Other lack of coordination: Secondary | ICD-10-CM | POA: Diagnosis not present

## 2021-09-11 DIAGNOSIS — K589 Irritable bowel syndrome without diarrhea: Secondary | ICD-10-CM | POA: Diagnosis present

## 2021-09-11 DIAGNOSIS — D539 Nutritional anemia, unspecified: Secondary | ICD-10-CM | POA: Diagnosis present

## 2021-09-11 DIAGNOSIS — T502X5A Adverse effect of carbonic-anhydrase inhibitors, benzothiadiazides and other diuretics, initial encounter: Secondary | ICD-10-CM | POA: Diagnosis present

## 2021-09-11 DIAGNOSIS — R2689 Other abnormalities of gait and mobility: Secondary | ICD-10-CM | POA: Diagnosis not present

## 2021-09-11 DIAGNOSIS — N390 Urinary tract infection, site not specified: Secondary | ICD-10-CM | POA: Diagnosis present

## 2021-09-11 DIAGNOSIS — Z7401 Bed confinement status: Secondary | ICD-10-CM | POA: Diagnosis not present

## 2021-09-11 DIAGNOSIS — U071 COVID-19: Secondary | ICD-10-CM | POA: Diagnosis not present

## 2021-09-11 DIAGNOSIS — E871 Hypo-osmolality and hyponatremia: Secondary | ICD-10-CM | POA: Diagnosis not present

## 2021-09-11 DIAGNOSIS — I517 Cardiomegaly: Secondary | ICD-10-CM | POA: Diagnosis not present

## 2021-09-11 DIAGNOSIS — M1990 Unspecified osteoarthritis, unspecified site: Secondary | ICD-10-CM | POA: Diagnosis not present

## 2021-09-11 DIAGNOSIS — R2681 Unsteadiness on feet: Secondary | ICD-10-CM | POA: Diagnosis not present

## 2021-09-11 DIAGNOSIS — R5381 Other malaise: Secondary | ICD-10-CM | POA: Diagnosis not present

## 2021-09-11 LAB — CBC WITH DIFFERENTIAL/PLATELET
Abs Immature Granulocytes: 0.11 10*3/uL — ABNORMAL HIGH (ref 0.00–0.07)
Basophils Absolute: 0.1 10*3/uL (ref 0.0–0.1)
Basophils Relative: 1 %
Eosinophils Absolute: 0 10*3/uL (ref 0.0–0.5)
Eosinophils Relative: 0 %
HCT: 33.3 % — ABNORMAL LOW (ref 39.0–52.0)
Hemoglobin: 11.1 g/dL — ABNORMAL LOW (ref 13.0–17.0)
Immature Granulocytes: 1 %
Lymphocytes Relative: 13 %
Lymphs Abs: 1.2 10*3/uL (ref 0.7–4.0)
MCH: 34.4 pg — ABNORMAL HIGH (ref 26.0–34.0)
MCHC: 33.3 g/dL (ref 30.0–36.0)
MCV: 103.1 fL — ABNORMAL HIGH (ref 80.0–100.0)
Monocytes Absolute: 0.4 10*3/uL (ref 0.1–1.0)
Monocytes Relative: 4 %
Neutro Abs: 7.7 10*3/uL (ref 1.7–7.7)
Neutrophils Relative %: 81 %
Platelets: 82 10*3/uL — ABNORMAL LOW (ref 150–400)
RBC: 3.23 MIL/uL — ABNORMAL LOW (ref 4.22–5.81)
RDW: 15.3 % (ref 11.5–15.5)
Smear Review: NORMAL
WBC: 9.5 10*3/uL (ref 4.0–10.5)
nRBC: 0 % (ref 0.0–0.2)

## 2021-09-11 LAB — COMPREHENSIVE METABOLIC PANEL
ALT: 32 U/L (ref 0–44)
AST: 46 U/L — ABNORMAL HIGH (ref 15–41)
Albumin: 3.6 g/dL (ref 3.5–5.0)
Alkaline Phosphatase: 48 U/L (ref 38–126)
Anion gap: 12 (ref 5–15)
BUN: 36 mg/dL — ABNORMAL HIGH (ref 8–23)
CO2: 27 mmol/L (ref 22–32)
Calcium: 8.7 mg/dL — ABNORMAL LOW (ref 8.9–10.3)
Chloride: 96 mmol/L — ABNORMAL LOW (ref 98–111)
Creatinine, Ser: 2.28 mg/dL — ABNORMAL HIGH (ref 0.61–1.24)
GFR, Estimated: 27 mL/min — ABNORMAL LOW (ref >60)
Glucose, Bld: 106 mg/dL — ABNORMAL HIGH (ref 70–99)
Potassium: 4.4 mmol/L (ref 3.5–5.1)
Sodium: 135 mmol/L (ref 135–145)
Total Bilirubin: 1 mg/dL (ref 0.3–1.2)
Total Protein: 6.6 g/dL (ref 6.5–8.1)

## 2021-09-11 LAB — RESP PANEL BY RT-PCR (FLU A&B, COVID) ARPGX2
Influenza A by PCR: NEGATIVE
Influenza B by PCR: NEGATIVE
SARS Coronavirus 2 by RT PCR: NEGATIVE

## 2021-09-11 LAB — URINALYSIS, COMPLETE (UACMP) WITH MICROSCOPIC
Bilirubin Urine: NEGATIVE
Glucose, UA: NEGATIVE mg/dL
Ketones, ur: 5 mg/dL — AB
Leukocytes,Ua: NEGATIVE
Nitrite: NEGATIVE
Protein, ur: 100 mg/dL — AB
Specific Gravity, Urine: 1.019 (ref 1.005–1.030)
WBC, UA: >50 WBC/hpf — ABNORMAL HIGH (ref 0–5)
pH: 5 (ref 5.0–8.0)

## 2021-09-11 LAB — LACTIC ACID, PLASMA: Lactic Acid, Venous: 1.6 mmol/L (ref 0.5–1.9)

## 2021-09-11 LAB — BRAIN NATRIURETIC PEPTIDE: B Natriuretic Peptide: 442.7 pg/mL — ABNORMAL HIGH (ref 0.0–100.0)

## 2021-09-11 MED ORDER — PRAVASTATIN SODIUM 20 MG PO TABS
20.0000 mg | ORAL_TABLET | Freq: Every day | ORAL | Status: DC
  Administered 2021-09-12 – 2021-09-27 (×16): 20 mg via ORAL
  Filled 2021-09-11 (×16): qty 1

## 2021-09-11 MED ORDER — ONDANSETRON HCL 4 MG PO TABS: 4.0000 mg | ORAL_TABLET | Freq: Four times a day (QID) | ORAL | Status: DC | PRN

## 2021-09-11 MED ORDER — HYDROXYCHLOROQUINE SULFATE 200 MG PO TABS
200.0000 mg | ORAL_TABLET | Freq: Two times a day (BID) | ORAL | Status: DC
  Administered 2021-09-11 – 2021-09-28 (×34): 200 mg via ORAL
  Filled 2021-09-11 (×34): qty 1

## 2021-09-11 MED ORDER — PANTOPRAZOLE SODIUM 40 MG PO TBEC
40.0000 mg | DELAYED_RELEASE_TABLET | Freq: Every day | ORAL | Status: DC
  Administered 2021-09-12 – 2021-09-28 (×17): 40 mg via ORAL
  Filled 2021-09-11 (×17): qty 1

## 2021-09-11 MED ORDER — ACETAMINOPHEN 325 MG PO TABS
650.0000 mg | ORAL_TABLET | Freq: Four times a day (QID) | ORAL | Status: DC | PRN
  Administered 2021-09-11 – 2021-09-28 (×21): 650 mg via ORAL
  Filled 2021-09-11 (×22): qty 2

## 2021-09-11 MED ORDER — APIXABAN 2.5 MG PO TABS
2.5000 mg | ORAL_TABLET | Freq: Two times a day (BID) | ORAL | Status: DC
  Administered 2021-09-11 – 2021-09-20 (×19): 2.5 mg via ORAL
  Filled 2021-09-11 (×20): qty 1

## 2021-09-11 MED ORDER — ERYTHROMYCIN 5 MG/GM OP OINT: TOPICAL_OINTMENT | Freq: Every day | OPHTHALMIC | Status: DC

## 2021-09-11 MED ORDER — FOLIC ACID 1 MG PO TABS
1000.0000 ug | ORAL_TABLET | Freq: Every day | ORAL | Status: DC
  Administered 2021-09-12 – 2021-09-28 (×17): 1 mg via ORAL
  Filled 2021-09-11 (×17): qty 1

## 2021-09-11 MED ORDER — FLUTICASONE PROPIONATE 50 MCG/ACT NA SUSP
2.0000 | Freq: Two times a day (BID) | NASAL | Status: DC
  Administered 2021-09-12 – 2021-09-28 (×33): 2 via NASAL
  Filled 2021-09-11: qty 16

## 2021-09-11 MED ORDER — SODIUM CHLORIDE 0.9 % IV BOLUS
1000.0000 mL | Freq: Once | INTRAVENOUS | Status: AC
  Administered 2021-09-11: 1000 mL via INTRAVENOUS

## 2021-09-11 MED ORDER — ALPRAZOLAM 0.25 MG PO TABS
0.2500 mg | ORAL_TABLET | Freq: Three times a day (TID) | ORAL | Status: DC | PRN
  Administered 2021-09-12: 0.25 mg via ORAL
  Filled 2021-09-11: qty 1

## 2021-09-11 MED ORDER — POLYETHYLENE GLYCOL 3350 17 G PO PACK
17.0000 g | PACK | Freq: Every day | ORAL | Status: DC
  Administered 2021-09-11 – 2021-09-27 (×4): 17 g via ORAL
  Filled 2021-09-11 (×11): qty 1

## 2021-09-11 MED ORDER — ACETAMINOPHEN 650 MG RE SUPP: 650.0000 mg | Freq: Four times a day (QID) | RECTAL | Status: DC | PRN

## 2021-09-11 MED ORDER — CHLORHEXIDINE GLUCONATE 0.12 % MT SOLN: 1.0000 mL | OROMUCOSAL | Status: DC | PRN

## 2021-09-11 MED ORDER — SODIUM CHLORIDE 0.9 % IV SOLN: INTRAVENOUS | Status: DC

## 2021-09-11 MED ORDER — ONDANSETRON HCL 4 MG/2ML IJ SOLN: 4.0000 mg | Freq: Four times a day (QID) | INTRAMUSCULAR | Status: DC | PRN

## 2021-09-11 MED ORDER — ALIGN 4 MG PO CAPS: 1.0000 | ORAL_CAPSULE | Freq: Every day | ORAL | Status: DC

## 2021-09-11 MED ORDER — VITAMIN B-12 1000 MCG PO TABS
1000.0000 ug | ORAL_TABLET | Freq: Every day | ORAL | Status: DC
  Administered 2021-09-12 – 2021-09-28 (×17): 1000 ug via ORAL
  Filled 2021-09-11 (×17): qty 1

## 2021-09-11 MED ORDER — GABAPENTIN 600 MG PO TABS
300.0000 mg | ORAL_TABLET | Freq: Two times a day (BID) | ORAL | Status: DC
  Administered 2021-09-11 – 2021-09-28 (×22): 300 mg via ORAL
  Filled 2021-09-11 (×21): qty 1

## 2021-09-11 MED ORDER — MIRABEGRON ER 50 MG PO TB24
50.0000 mg | ORAL_TABLET | Freq: Every day | ORAL | Status: DC
  Administered 2021-09-12 – 2021-09-27 (×16): 50 mg via ORAL
  Filled 2021-09-11 (×18): qty 1

## 2021-09-11 NOTE — ED Triage Notes (Signed)
Pt comes pov with possible uti. Pt has catheter in. States fatigue, unable to sleep. Pt has chf and produces a lot of urine. Daughter with pt. Hx of prostate cancer. Daughter states decreased about of urine yesterday.

## 2021-09-11 NOTE — ED Notes (Signed)
Multiple IV attempts made for access. This RN and Danise Mina, RN unable to obtain access. IV team will be paged. MD notified.

## 2021-09-11 NOTE — ED Notes (Signed)
Indwelling chronic catheter detached from leg bag, urine sample obtained directly from catheter.

## 2021-09-11 NOTE — H&P (Signed)
[name] at [place] NAME: [name]    MR#:  [id number]  DATE OF BIRTH:  1934/06/07  DATE OF ADMISSION:  09/11/2021  PRIMARY CARE PHYSICIAN: [name], MD   REQUESTING/REFERRING PHYSICIAN: Dr Merlyn Lot  CHIEF COMPLAINT:   Chief Complaint  Patient presents with  . possible uti    HISTORY OF PRESENT ILLNESS:  [name]  is a 87 y.o. male with a known history of chronic diastolic congestive heart failure, indwelling Foley catheter, rheumatoid arthritis, chronic hyponatremia, chronic kidney disease.  He has been exhausted lately and having trouble sleeping.  He has been having a headache.  He has been feeling lousy.  This morning he woke up only 75 mL of dark urine was in his Foley bag.  He has been weak and struggling to get up and down.  Normally walks with a walker.  Last night he could not remember some prayers.  Family called EMS because they could not even get him to stand today.  In the ER he was hypotensive with a blood pressure of 94/37 on 1 occasion.  He was dehydrated with a BUN of 36 and a creatinine up to 2.28.  He normally takes Lasix 40 mg daily.  In the ER platelets also down at 82.  PAST MEDICAL HISTORY:   Past Medical History:  Diagnosis Date  . (HFpEF) heart failure with preserved ejection fraction ([place])    a. 02/2019 Echo: Ef 60-65%, mildly reduced RV fxn. RVSP 28mmHg. Mildly dil LA. Mod dil RA. Mild to mod TR. Mild to mod AS. Triv AI.  Marland Kitchen Allergy   . Anemia   . Anxiety   . Arthritis    rheumatoid  . Asthma   . Basal cell carcinoma    back  . CAD (coronary artery disease)   . CHF (congestive heart failure) ([place])   . Chronic kidney disease    Mass right kidney  . Chronic sinusitis   . Collagen vascular disease (HCC)    Rheumatoid Arthritis  . Depression   . Diverticulitis   . Dysrhythmia   . ED (erectile dysfunction)   . GERD (gastroesophageal reflux disease)   . History of SIADH   . Hx of  adenomatous colonic polyps   . Hypertension   . Hyponatremia   . IBS (irritable bowel syndrome)   . Neurodermatitis   . Neurogenic bladder   . OSA (obstructive sleep apnea)    no CPAP since weight loss  . Persistent atrial fibrillation ([place])    a. Dx 02/2019; b. CHA2DS2VASc = 6-->eliquis initiated.  . Prostate cancer ([place])   . Renal mass    10/2018 4.2cm R renal cortical mass. Most compatible w/ clear cell renal cell carcinoma.    PAST SURGICAL HISTORY:   Past Surgical History:  Procedure Laterality Date  . CARDIOVERSION N/A 04/23/2019   Procedure: CARDIOVERSION;  Surgeon: Nelva Bush, MD;  Location: [facility] ORS;  Service: Cardiovascular;  Laterality: N/A;  . KNEE ARTHROPLASTY Right 09/02/2015   Procedure: COMPUTER ASSISTED TOTAL KNEE ARTHROPLASTY;  Surgeon: [name], MD;  Location: [facility] ORS;  Service: Orthopedics;  Laterality: Right;  . LAPAROSCOPIC NEPHRECTOMY, HAND ASSISTED Right 06/17/2019   Procedure: HAND ASSISTED LAPAROSCOPIC NEPHRECTOMY;  Surgeon: Hollice Espy, MD;  Location: [facility] ORS;  Service: Urology;  Laterality: Right;  . NASAL SINUS SURGERY  2009   DEVIATED SEPTUM AND POLYPS  . permanent indwelling catheter    . PROSTATE SURGERY  PROSTATECTOMY  . TOTAL KNEE ARTHROPLASTY Left 9/15   Dr [name]  . URETHRAL STRICTURE DILATATION  02-2010   Dr.Cope    SOCIAL HISTORY:   Social History   Tobacco Use  . Smoking status: Never  . Smokeless tobacco: Never  Substance Use Topics  . Alcohol use: Yes    Comment: 5 days out of 7 drinks scotch    FAMILY HISTORY:   Family History  Problem Relation Age of Onset  . Heart disease Mother 79       heart failure  . Heart failure Mother   . Pneumonia Father 67       pnemonia  . Skin cancer Father   . Skin cancer Sister   . Skin cancer Son   . Colon cancer Neg Hx   . Esophageal cancer Neg Hx   . Stomach cancer Neg Hx   . Pancreatic cancer Neg Hx   . Liver disease Neg Hx     DRUG ALLERGIES:   Allergies   Allergen Reactions  . Ciprofloxacin Nausea And Vomiting    Headache  . Citalopram Hydrobromide Other (See Comments)    "unknown"  . Clindamycin Nausea And Vomiting  . Lorazepam Other (See Comments)    Adverse reaction  . Paroxetine Nausea Only  . Ramipril Other (See Comments)    "unknown"  . Simvastatin   . Sulfa Antibiotics Other (See Comments)    REVIEW OF SYSTEMS:  CONSTITUTIONAL: No fever, positive for fatigue EYES: No double vision.  EARS, NOSE, AND THROAT: No sore throat RESPIRATORY: No shortness of breath.  CARDIOVASCULAR: No chest pain.  GASTROINTESTINAL: No nausea, vomiting, diarrhea or abdominal pain.  GENITOURINARY: Has chronic Foley catheter that leaks. ENDOCRINE: No polyuria, nocturia,  HEMATOLOGY: No anemia. SKIN: No rash. MUSCULOSKELETAL: Joint pains with rheumatoid arthritis.   NEUROLOGIC: Positive for generalized weakness weakness.     MEDICATIONS AT HOME:   Prior to Admission medications   Medication Sig Start Date End Date Taking? Authorizing Provider  acetaminophen (TYLENOL) 500 MG tablet Take 500 mg by mouth every 8 (eight) hours as needed for moderate pain or headache.     [provider]  ALOE PO Take 400 mg by mouth daily.     [provider]  ALPRAZolam Duanne Moron) 0.5 MG tablet TAKE 1 TABLET BY MOUTH THREE TIMES DAILY AS NEEDED FOR ANXIETY OR SLEEP 09/09/21   [name], MD  apixaban (ELIQUIS) 5 MG TABS tablet TAKE 1 TABLET(5 MG) BY MOUTH TWICE DAILY 07/09/21   Wellington Hampshire, MD  Ascorbic Acid (VITAMIN C PO) Take 1,000 mg by mouth daily.     [provider]  Calcium-Magnesium-Zinc [phone] MG TABS Take 1 tablet by mouth daily.    [provider]  chlorhexidine (PERIDEX) 0.12 % solution 1 mL by Mouth Rinse route as needed. 10/13/20   [provider]  Cholecalciferol (VITAMIN D3) 5000 UNITS CAPS Take 2,000 Units by mouth daily.     [provider]  Coenzyme Q10 (CO Q 10) 100 MG CAPS Take  100 mg by mouth daily.     [provider]  CRANBERRY SOFT PO Take 25,200 mg by mouth daily. Takes 2 capsules once a day.    [provider]  EPINEPHrine 0.3 mg/0.3 mL IJ SOAJ injection Inject 0.3 mg into the muscle as needed for anaphylaxis.    [provider]  erythromycin ophthalmic ointment SMARTSIG:0.25 Sparingly In Eye(s) Every Night 02/11/21   [provider]  fluticasone Asencion Islam)  50 MCG/ACT nasal spray Place 2 sprays into both nostrils 2 (two) times daily. 04/27/17   [name], MD  folic acid (FOLVITE) 299 MCG tablet Take 1,200 mcg by mouth daily.    [provider]  furosemide (LASIX) 20 MG tablet Take 2 tablets (40 mg total) by mouth daily. 05/11/21   Wellington Hampshire, MD  gabapentin (NEURONTIN) 300 MG capsule Takes 1 capsule at lunch time.    [provider]  gabapentin (NEURONTIN) 600 MG tablet Take 600 mg by mouth 2 (two) times daily at 8 am and 10 pm.    [provider]  hydroxychloroquine (PLAQUENIL) 200 MG tablet Take 200 mg by mouth 2 (two) times daily.  01/22/16   [provider]  ketoconazole (NIZORAL) 2 % cream Apply 1 application topically 2 (two) times daily. 02/12/21   Debroah Loop, PA-C  ketoconazole (NIZORAL) 2 % shampoo  06/15/20   [provider]  methotrexate (RHEUMATREX) 2.5 MG tablet Take 10 tablets by mouth once a week. 07/01/19   [provider]  metoprolol tartrate (LOPRESSOR) 25 MG tablet TAKE 1/2 TABLET(12.5 MG) BY MOUTH TWICE DAILY 08/03/21   Wellington Hampshire, MD  milk thistle 175 MG tablet Take 175 mg by mouth daily.    [provider]  Multiple Vitamins-Minerals (BALANCED CARE SENIORS PO) Take 6 tablets by mouth daily. 3 tablets of fruit and 3 tablets of vegetables daily  Balance of Nature    [provider]  MYRBETRIQ 50 MG TB24 tablet TAKE 1 TABLET(50 MG) BY MOUTH DAILY 09/09/21   Hollice Espy, MD  Omega-3 Fatty Acids (FISH OIL) 1200 MG  CAPS Take 1,200 mg by mouth daily.    [provider]  omeprazole (PRILOSEC) 20 MG capsule Take 1 capsule (20 mg total) by mouth daily as needed. 02/09/21   [name], MD  PHOSPHATIDYL CHOLINE PO Take 1 tablet by mouth daily.    [provider]  polyethylene glycol powder (GLYCOLAX/MIRALAX) 17 GM/SCOOP powder Take 17 g by mouth at bedtime.    [provider]  pravastatin (PRAVACHOL) 20 MG tablet TAKE 1 TABLET(20 MG) BY MOUTH AT BEDTIME 01/18/21   [name], MD  Probiotic Product (ALIGN) 4 MG CAPS Take 1 capsule (4 mg total) by mouth daily. 12/15/16   Nicholes Mango, MD  Selenium 200 MCG CAPS Take 200 mcg by mouth daily.     [provider]  triamcinolone ointment (KENALOG) 0.1 % Apply 1 application topically as needed. 10/06/20   [provider]  Turmeric 500 MG CAPS Take 500 mg by mouth daily.     [provider]  vitamin B-12 (CYANOCOBALAMIN) 1000 MCG tablet Take 1,000 mcg by mouth daily.    [provider]  Zinc 50 MG TABS Take by mouth.    [provider]      VITAL SIGNS:  Blood pressure 137/69, pulse 87, temperature 98.4 F (36.9 C), temperature source Oral, resp. rate (!) 29, weight 95 kg, SpO2 94 %.  PHYSICAL EXAMINATION:  GENERAL:  87 y.o.-year-old patient lying in the bed with no acute distress.  EYES: Pupils equal, round, reactive to light and accommodation. No scleral icterus.  HEENT: Head atraumatic, normocephalic. Oropharynx and nasopharynx clear.  NECK:  Supple, no jugular venous distention. No thyroid enlargement.  LUNGS: Normal breath sounds bilaterally, no wheezing, rales,rhonchi or crepitation. No use of accessory muscles of respiration.  CARDIOVASCULAR: S1, S2 normal. No murmurs, rubs, or gallops.  ABDOMEN: Soft, nontender,  nondistended. Bowel sounds present. No organomegaly or mass.  EXTREMITIES: 1+ pedal edema.no cyanosis, or clubbing.  NEUROLOGIC: Cranial nerves II through XII are  intact. Sensation intact. Gait not checked.  PSYCHIATRIC: The patient is alert and oriented x 3.  SKIN: No rash, lesion, or ulcer.   LABORATORY PANEL:   CBC Recent Labs  Lab 09/11/21 1405  WBC 9.5  HGB 11.1*  HCT 33.3*  PLT 82*   ------------------------------------------------------------------------------------------------------------------  Chemistries  Recent Labs  Lab 09/11/21 1405  NA 135  K 4.4  CL 96*  CO2 27  GLUCOSE 106*  BUN 36*  CREATININE 2.28*  CALCIUM 8.7*  AST 46*  ALT 32  ALKPHOS 48  BILITOT 1.0   ------------------------------------------------------------------------------------------------------------------   RADIOLOGY:  DG Chest Portable 1 View  Result Date: 09/11/2021 CLINICAL DATA:  Weakness, evaluate for edema EXAM: PORTABLE CHEST 1 VIEW COMPARISON:  Chest radiograph 01/18/2021 FINDINGS: The heart is at the upper limits of normal for size. The mediastinal contours are stable, with unchanged calcified atherosclerotic plaque of the aortic arch. There is vascular congestion and mild pulmonary interstitial edema. There is no focal consolidation. There is no pleural effusion. There is no pneumothorax. An accessory azygous fissure is again noted. There is marked degenerative change of the shoulders. IMPRESSION: Mild pulmonary interstitial edema. Electronically Signed   By: Valetta Mole M.D.   On: 09/11/2021 16:06      IMPRESSION AND PLAN:   1.  Acute kidney injury on chronic kidney disease stage II.  Creatinine 2.28 on presentation.  Baseline creatinine around 1.14.  ER physician ordered an IV fluid bolus and will give gentle IV fluids overnight.  Hold Lasix.  Check orthostatic vital signs. 2.  Hypotension.  IV fluid bolus and gentle IV fluid hydration hold Lasix and metoprolol.  Check orthostatic vital signs. 3.  Thrombocytopenia.  Platelet count a little lower than his usual normal.  We will get a peripheral smear.  Holding methotrexate.  Continue  folic acid. 4.  Urine analysis does show white blood cells but nitrites and leukocyte Estrace are negative.  We will hold off on any treatment I do not think that this is a urinary tract infection since the patient is not septic. 5.  Paroxysmal atrial fibrillation.  Decrease dose of Eliquis down to 2.5 mg twice a day with age and kidney function currently. 6.  Chronic diastolic congestive heart failure holding Lasix.  Watch closely with giving IV fluid hydration. 7.  Rheumatoid arthritis on Plaquenil.  Holding methotrexate. 8.  History of hyponatremia.  Sodium is normal range 135 now.  Watch with IV fluids 9.  Weakness physical therapy and Occupational Therapy evaluation 10.  Hyperlipidemia unspecified on pravastatin.  Check a CPK 11.  Neuropathy decrease dose of gabapentin with acute kidney injury.    All the records are reviewed and case discussed with ED provider. Management plans discussed with the patient, family and they are in agreement.  Patient meets inpatient criteria with acute kidney injury, hypotension and dehydration.  Will be a tough balance with fluid balance with giving fluids and history of congestive heart failure.  Also working up causes for thrombocytopenia.  CODE STATUS: Full code  TOTAL TIME TAKING CARE OF THIS PATIENT: 50 minutes.    Loletha Grayer M.D on 09/11/2021 at 6:02 PM   [facility]  CC: Primary care physician; [name], MD

## 2021-09-11 NOTE — ED Notes (Signed)
Lab contacted to send phlebotomist  

## 2021-09-11 NOTE — ED Provider Notes (Signed)
[facility] Emergency Department Provider Note    Event Date/Time   First MD Initiated Contact with Patient 09/11/21 1439     (approximate)  I have reviewed the triage vital signs and the nursing notes.   HISTORY  Chief Complaint possible uti    HPI [name] is a 87 y.o. male below listed past medical history presents to the ER for evaluation of lightheadedness malaise and fatigue.  Symptoms of been ongoing for the past several days but progressively worsening today to the point where he is having trouble getting up and walking around because he is feeling so lightheaded.  Has had increased diuretics and fluid restriction.  Noted that he is not having much urine output over the past 24 hours.  Past Medical History:  Diagnosis Date   (HFpEF) heart failure with preserved ejection fraction ([place])    a. 02/2019 Echo: Ef 60-65%, mildly reduced RV fxn. RVSP 58mmHg. Mildly dil LA. Mod dil RA. Mild to mod TR. Mild to mod AS. Triv AI.   Allergy    Anemia    Anxiety    Arthritis    rheumatoid   Asthma    Basal cell carcinoma    back   CAD (coronary artery disease)    CHF (congestive heart failure) (HCC)    Chronic kidney disease    Mass right kidney   Chronic sinusitis    Collagen vascular disease (HCC)    Rheumatoid Arthritis   Depression    Diverticulitis    Dysrhythmia    ED (erectile dysfunction)    GERD (gastroesophageal reflux disease)    History of SIADH    Hx of adenomatous colonic polyps    Hypertension    Hyponatremia    IBS (irritable bowel syndrome)    Neurodermatitis    Neurogenic bladder    OSA (obstructive sleep apnea)    no CPAP since weight loss   Persistent atrial fibrillation ([place])    a. Dx 02/2019; b. CHA2DS2VASc = 6-->eliquis initiated.   Prostate cancer [facility])    Renal mass    10/2018 4.2cm R renal cortical mass. Most compatible w/ clear cell renal cell carcinoma.   Family History  Problem Relation Age of Onset   Heart  disease Mother 9       heart failure   Pneumonia Father 65       pnemonia   Skin cancer Father    Skin cancer Sister    Skin cancer Son    Colon cancer Neg Hx    Esophageal cancer Neg Hx    Stomach cancer Neg Hx    Pancreatic cancer Neg Hx    Liver disease Neg Hx    Past Surgical History:  Procedure Laterality Date   CARDIOVERSION N/A 04/23/2019   Procedure: CARDIOVERSION;  Surgeon: Nelva Bush, MD;  Location: [facility] ORS;  Service: Cardiovascular;  Laterality: N/A;   KNEE ARTHROPLASTY Right 09/02/2015   Procedure: COMPUTER ASSISTED TOTAL KNEE ARTHROPLASTY;  Surgeon: [name], MD;  Location: [facility] ORS;  Service: Orthopedics;  Laterality: Right;   LAPAROSCOPIC NEPHRECTOMY, HAND ASSISTED Right 06/17/2019   Procedure: HAND ASSISTED LAPAROSCOPIC NEPHRECTOMY;  Surgeon: Hollice Espy, MD;  Location: [facility] ORS;  Service: Urology;  Laterality: Right;   NASAL SINUS SURGERY  2009   DEVIATED SEPTUM AND POLYPS   permanent indwelling catheter     PROSTATE SURGERY     PROSTATECTOMY   TOTAL KNEE ARTHROPLASTY Left 9/15   Dr [name]  URETHRAL STRICTURE DILATATION  02-2010   Dr.Cope   Patient Active Problem List   Diagnosis Date Noted   Severe persistent asthma 02/05/2021   Postprocedure pain management follow-up 12/08/2020   Spondylosis without myelopathy or radiculopathy, lumbosacral region 11/24/2020   Elevated C-reactive protein (CRP) 11/24/2020   Elevated sed rate 11/24/2020   Chronic pain syndrome 11/04/2020   Pharmacologic therapy 11/04/2020   Disorder of skeletal system 11/04/2020   Problems influencing health status 11/04/2020   Abnormal MRI, lumbar spine (09/21/2020) 11/04/2020   Chronic low back pain (Bilateral) w/o sciatica 11/04/2020   DDD (degenerative disc disease), lumbosacral 11/04/2020   Lumbosacral foraminal stenosis 11/04/2020   Lumbar facet hypertrophy 11/04/2020   Levoscoliosis of lumbosacral spine 11/04/2020   Grade 1 Retrolisthesis at L2-3 and L3-4  11/04/2020   Chronic anticoagulation (Eliquis) 11/04/2020   Lumbar facet syndrome 11/04/2020   Long term prescription benzodiazepine use 11/04/2020   Chronic shoulder pain (Bilateral) 11/04/2020   Chronic elbow pain (Bilateral) 11/04/2020   Chronic wrist pain (Bilateral) 11/04/2020   Chronic sinusitis 07/12/2020   Persistent atrial fibrillation ([place]) 07/12/2020   Anemia in chronic kidney disease 09/12/2019   Edema of lower extremity 09/12/2019   Hyposmolality and/or hyponatremia 09/12/2019   Renal cell carcinoma ([place]) 07/11/2019   Paroxysmal atrial fibrillation ([place]) 05/09/2019   History of TIA (transient ischemic attack) 12/06/2018   Vision changes 12/06/2018   TIA (transient ischemic attack) 06/06/2018   Carotid artery disease ([place]) 06/06/2018   Frailty 04/24/2018   Long term current use of immunosuppressive drug 04/24/2018   Obesity (BMI 30-39.9) 04/24/2018   SIADH (syndrome of inappropriate ADH production) ([place]) 04/17/2017   AKI (acute kidney injury) ([place]) 04/03/2017   Hyponatremia 01/16/2017   Urinary tract infection associated with indwelling urethral catheter ([place]) 12/19/2016   Asthma with acute exacerbation 11/14/2016   Mild intermittent asthma without complication 33/82/5053   Mood disorder ([place]) 01/18/2016   Presence of indwelling urinary catheter 08/20/2015   Advanced directives, counseling/discussion 07/28/2014   Routine general medical examination at a health care facility 04/09/2012   Venous stasis dermatitis 06/13/2011   Sleep disturbance 12/21/2010   CONSTIPATION, CHRONIC 12/07/2010   Chronic constipation 12/07/2010   Hyperlipidemia 12/02/2010   OSTEOARTHRITIS 12/02/2010   Neurogenic bladder 05/24/2010   IBS 05/05/2010   History of colonic polyps 05/05/2010   NEUROPATHY 05/03/2010   Mononeuritis 05/03/2010   Personal history of prostate cancer 03/04/2010   Benign essential hypertension 03/25/2009   GAD (generalized anxiety disorder) 01/27/2007   Coronary  atherosclerosis of native coronary artery 01/27/2007   Allergic asthma 01/27/2007   GERD 01/27/2007   Chronic rheumatic arthritis ([place]) 01/27/2007   Rheumatoid arthritis involving multiple sites with positive rheumatoid factor ([place]) 01/27/2007      Prior to Admission medications   Medication Sig Start Date End Date Taking? Authorizing Provider  acetaminophen (TYLENOL) 500 MG tablet Take 500 mg by mouth every 8 (eight) hours as needed for moderate pain or headache.     [provider]  ALOE PO Take 400 mg by mouth daily.     [provider]  ALPRAZolam Duanne Moron) 0.5 MG tablet TAKE 1 TABLET BY MOUTH THREE TIMES DAILY AS NEEDED FOR ANXIETY OR SLEEP 09/09/21   [name], MD  apixaban (ELIQUIS) 5 MG TABS tablet TAKE 1 TABLET(5 MG) BY MOUTH TWICE DAILY 07/09/21   Wellington Hampshire, MD  Ascorbic Acid (VITAMIN C PO) Take 1,000 mg by mouth daily.     [provider]  Calcium-Magnesium-Zinc 333-133-5 MG TABS Take 1 tablet by mouth daily.    [provider]  chlorhexidine (PERIDEX) 0.12 % solution 1 mL by Mouth Rinse route as needed. 10/13/20   [provider]  Cholecalciferol (VITAMIN D3) 5000 UNITS CAPS Take 2,000 Units by mouth daily.     [provider]  Coenzyme Q10 (CO Q 10) 100 MG CAPS Take 100 mg by mouth daily.     [provider]  CRANBERRY SOFT PO Take 25,200 mg by mouth daily. Takes 2 capsules once a day.    [provider]  EPINEPHrine 0.3 mg/0.3 mL IJ SOAJ injection Inject 0.3 mg into the muscle as needed for anaphylaxis.    [provider]  erythromycin ophthalmic ointment SMARTSIG:0.25 Sparingly In Eye(s) Every Night 02/11/21   [provider]  fluticasone (FLONASE) 50 MCG/ACT nasal spray Place 2 sprays into both nostrils 2 (two) times daily. 04/27/17   [name], MD  folic acid (FOLVITE) 161 MCG tablet Take 1,200 mcg by mouth daily.    [provider]  furosemide (LASIX) 20 MG  tablet Take 2 tablets (40 mg total) by mouth daily. 05/11/21   Wellington Hampshire, MD  gabapentin (NEURONTIN) 300 MG capsule Takes 1 capsule at lunch time.    [provider]  gabapentin (NEURONTIN) 600 MG tablet Take 600 mg by mouth 2 (two) times daily at 8 am and 10 pm.    [provider]  hydroxychloroquine (PLAQUENIL) 200 MG tablet Take 200 mg by mouth 2 (two) times daily.  01/22/16   [provider]  ketoconazole (NIZORAL) 2 % cream Apply 1 application topically 2 (two) times daily. 02/12/21   Debroah Loop, PA-C  ketoconazole (NIZORAL) 2 % shampoo  06/15/20   [provider]  methotrexate (RHEUMATREX) 2.5 MG tablet Take 10 tablets by mouth once a week. 07/01/19   [provider]  metoprolol tartrate (LOPRESSOR) 25 MG tablet TAKE 1/2 TABLET(12.5 MG) BY MOUTH TWICE DAILY 08/03/21   Wellington Hampshire, MD  milk thistle 175 MG tablet Take 175 mg by mouth daily.    [provider]  Multiple Vitamins-Minerals (BALANCED CARE SENIORS PO) Take 6 tablets by mouth daily. 3 tablets of fruit and 3 tablets of vegetables daily  Balance of Nature    [provider]  MYRBETRIQ 50 MG TB24 tablet TAKE 1 TABLET(50 MG) BY MOUTH DAILY 09/09/21   Hollice Espy, MD  Omega-3 Fatty Acids (FISH OIL) 1200 MG CAPS Take 1,200 mg by mouth daily.    [provider]  omeprazole (PRILOSEC) 20 MG capsule Take 1 capsule (20 mg total) by mouth daily as needed. 02/09/21   [name], MD  PHOSPHATIDYL CHOLINE PO Take 1 tablet by mouth daily.    [provider]  polyethylene glycol powder (GLYCOLAX/MIRALAX) 17 GM/SCOOP powder Take 17 g by mouth at bedtime.    [provider]  pravastatin (PRAVACHOL) 20 MG tablet TAKE 1 TABLET(20 MG) BY MOUTH AT BEDTIME 01/18/21   [name], MD  Probiotic Product (ALIGN) 4 MG CAPS Take 1 capsule (4 mg total) by mouth daily. 12/15/16   Nicholes Mango, MD  Selenium 200 MCG CAPS Take 200 mcg by mouth  daily.     [provider]  triamcinolone ointment (KENALOG) 0.1 % Apply 1 application topically as needed. 10/06/20   [provider]  Turmeric 500 MG CAPS Take 500 mg by mouth daily.     [provider]  vitamin  B-12 (CYANOCOBALAMIN) 1000 MCG tablet Take 1,000 mcg by mouth daily.    [provider]  Zinc 50 MG TABS Take by mouth.    [provider]    Allergies Ciprofloxacin, Citalopram hydrobromide, Clindamycin, Lorazepam, Paroxetine, Ramipril, Simvastatin, and Sulfa antibiotics    Social History Social History   Tobacco Use   Smoking status: Never   Smokeless tobacco: Never  Vaping Use   Vaping Use: Never used  Substance Use Topics   Alcohol use: Yes    Comment: 5 days out of 7 drinks gin    Drug use: No    Review of Systems Patient denies headaches, rhinorrhea, blurry vision, numbness, shortness of breath, chest pain, edema, cough, abdominal pain, nausea, vomiting, diarrhea, dysuria, fevers, rashes or hallucinations unless otherwise stated above in HPI. ____________________________________________   PHYSICAL EXAM:  VITAL SIGNS: Vitals:   09/11/21 1605 09/11/21 1630  BP: (!) 133/49 (!) 125/53  Pulse: 86 84  Resp: 16 (!) 29  Temp:    SpO2: 95% 92%    Constitutional: Alert and oriented.  Eyes: Conjunctivae are normal.  Head: Atraumatic. Nose: No congestion/rhinnorhea. Mouth/Throat: Mucous membranes are moist.   Neck: No stridor. Painless ROM.  Cardiovascular: Normal rate, regular rhythm. Grossly normal heart sounds.  Good peripheral circulation. Respiratory: Normal respiratory effort.  No retractions. Lungs CTAB. Gastrointestinal: Soft and nontender. No distention. No abdominal bruits. No CVA tenderness. Genitourinary: indwelling catheter Musculoskeletal: No lower extremity tenderness nor edema.  No joint effusions. Neurologic:  Normal speech and language. No gross focal neurologic deficits are appreciated. No facial  droop Skin:  Skin is warm, dry and intact. No rash noted. Psychiatric: Mood and affect are normal. Speech and behavior are normal.  ____________________________________________   LABS (all labs ordered are listed, but only abnormal results are displayed)  Results for orders placed or performed during the hospital encounter of 09/11/21 (from the past 24 hour(s))  Urinalysis, Complete w Microscopic Urine, Catheterized     Status: Abnormal   Collection Time: 09/11/21 12:20 PM  Result Value Ref Range   Color, Urine YELLOW (A) YELLOW   APPearance TURBID (A) CLEAR   Specific Gravity, Urine 1.019 1.005 - 1.030   pH 5.0 5.0 - 8.0   Glucose, UA NEGATIVE NEGATIVE mg/dL   Hgb urine dipstick MODERATE (A) NEGATIVE   Bilirubin Urine NEGATIVE NEGATIVE   Ketones, ur 5 (A) NEGATIVE mg/dL   Protein, ur 100 (A) NEGATIVE mg/dL   Nitrite NEGATIVE NEGATIVE   Leukocytes,Ua NEGATIVE NEGATIVE   RBC / HPF 21-50 0 - 5 RBC/hpf   WBC, UA >50 (H) 0 - 5 WBC/hpf   Bacteria, UA FEW (A) NONE SEEN   Squamous Epithelial / LPF 0-5 0 - 5   WBC Clumps PRESENT    Mucus PRESENT    Amorphous Crystal PRESENT   Comprehensive metabolic panel     Status: Abnormal   Collection Time: 09/11/21  2:05 PM  Result Value Ref Range   Sodium 135 135 - 145 mmol/L   Potassium 4.4 3.5 - 5.1 mmol/L   Chloride 96 (L) 98 - 111 mmol/L   CO2 27 22 - 32 mmol/L   Glucose, Bld 106 (H) 70 - 99 mg/dL   BUN 36 (H) 8 - 23 mg/dL   Creatinine, Ser 2.28 (H) 0.61 - 1.24 mg/dL   Calcium 8.7 (L) 8.9 - 10.3 mg/dL   Total Protein 6.6 6.5 - 8.1 g/dL   Albumin 3.6 3.5 - 5.0 g/dL   AST 46 (H)  15 - 41 U/L   ALT 32 0 - 44 U/L   Alkaline Phosphatase 48 38 - 126 U/L   Total Bilirubin 1.0 0.3 - 1.2 mg/dL   GFR, Estimated 27 (L) >60 mL/min   Anion gap 12 5 - 15  CBC with Differential     Status: Abnormal   Collection Time: 09/11/21  2:05 PM  Result Value Ref Range   WBC 9.5 4.0 - 10.5 K/uL   RBC 3.23 (L) 4.22 - 5.81 MIL/uL   Hemoglobin 11.1 (L)  13.0 - 17.0 g/dL   HCT 33.3 (L) 39.0 - 52.0 %   MCV 103.1 (H) 80.0 - 100.0 fL   MCH 34.4 (H) 26.0 - 34.0 pg   MCHC 33.3 30.0 - 36.0 g/dL   RDW 15.3 11.5 - 15.5 %   Platelets 82 (L) 150 - 400 K/uL   nRBC 0.0 0.0 - 0.2 %   Neutrophils Relative % 81 %   Neutro Abs 7.7 1.7 - 7.7 K/uL   Lymphocytes Relative 13 %   Lymphs Abs 1.2 0.7 - 4.0 K/uL   Monocytes Relative 4 %   Monocytes Absolute 0.4 0.1 - 1.0 K/uL   Eosinophils Relative 0 %   Eosinophils Absolute 0.0 0.0 - 0.5 K/uL   Basophils Relative 1 %   Basophils Absolute 0.1 0.0 - 0.1 K/uL   WBC Morphology MORPHOLOGY UNREMARKABLE    Smear Review Normal platelet morphology    Immature Granulocytes 1 %   Abs Immature Granulocytes 0.11 (H) 0.00 - 0.07 K/uL   Polychromasia PRESENT   Brain natriuretic peptide     Status: Abnormal   Collection Time: 09/11/21  2:05 PM  Result Value Ref Range   B Natriuretic Peptide 442.7 (H) 0.0 - 100.0 pg/mL  Resp Panel by RT-PCR (Flu A&B, Covid) Urine, Catheterized     Status: None   Collection Time: 09/11/21  3:41 PM   Specimen: Urine, Catheterized; Nasopharyngeal(NP) swabs in vial transport medium  Result Value Ref Range   SARS Coronavirus 2 by RT PCR NEGATIVE NEGATIVE   Influenza A by PCR NEGATIVE NEGATIVE   Influenza B by PCR NEGATIVE NEGATIVE   *Note: Due to a large number of results and/or encounters for the requested time period, some results have not been displayed. A complete set of results can be found in Results Review.   ____________________________________________ ____________________________________________  [id number]  I personally reviewed all radiographic images ordered to evaluate for the above acute complaints and reviewed radiology reports and findings.  These findings were personally discussed with the patient.  Please see medical record for radiology report.  ____________________________________________   PROCEDURES  Procedure(s) performed:  Procedures    Critical Care  performed: no ____________________________________________   INITIAL IMPRESSION / ASSESSMENT AND PLAN / ED COURSE  Pertinent labs & imaging results that were available during my care of the patient were reviewed by me and considered in my medical decision making (see chart for details).   DDX: AKI, electrolyte abnormality, dehydration, medication effect, urinary retention, UTI, sepsis  KHALFANI WEIDEMAN is a 87 y.o. who presents to the ED with presentation as described above.  Patient with low blood pressure in triage but improving with some oral hydration.  Patient with difficult IV access and I suspect these fairly dry there is chest x-ray shows edema and he does have some lower extremity edema as well.  Will order some IV fluids.  Does have evidence of AKI but potassium is normal. Urinalysis without sign of  clear infection does have 50 whites but negative nitrites negative leuk esterase no white count I do not believe he is septic.  Given his age risk factors I did review benefit from hospitalization for gentle IV hydration.  Discussed case with hospitalist who is agreeable plan.    The patient was evaluated in Emergency Department today for the symptoms described in the history of present illness. He/she was evaluated in the context of the global COVID-19 pandemic, which necessitated consideration that the patient might be at risk for infection with the SARS-CoV-2 virus that causes COVID-19. Institutional protocols and algorithms that pertain to the evaluation of patients at risk for COVID-19 are in a state of rapid change based on information released by regulatory bodies including the CDC and federal and state organizations. These policies and algorithms were followed during the patient's care in the ED.  As part of my medical decision making, I reviewed the following data within the [place] notes reviewed and incorporated, Labs reviewed, notes from prior ED visits and [place]  Controlled Substance Database   ____________________________________________   FINAL CLINICAL IMPRESSION(S) / ED DIAGNOSES  Final diagnoses:  AKI (acute kidney injury) ([place])  Hypotension, unspecified hypotension type      NEW MEDICATIONS STARTED DURING THIS VISIT:  New Prescriptions   No medications on file     Note:  This document was prepared using Dragon voice recognition software and may include unintentional dictation errors.    Merlyn Lot, MD 09/11/21 [phone]

## 2021-09-12 DIAGNOSIS — I480 Paroxysmal atrial fibrillation: Secondary | ICD-10-CM | POA: Diagnosis not present

## 2021-09-12 DIAGNOSIS — R509 Fever, unspecified: Secondary | ICD-10-CM | POA: Diagnosis not present

## 2021-09-12 DIAGNOSIS — N179 Acute kidney failure, unspecified: Secondary | ICD-10-CM | POA: Diagnosis not present

## 2021-09-12 DIAGNOSIS — E871 Hypo-osmolality and hyponatremia: Secondary | ICD-10-CM

## 2021-09-12 DIAGNOSIS — D696 Thrombocytopenia, unspecified: Secondary | ICD-10-CM | POA: Diagnosis not present

## 2021-09-12 LAB — CBC WITH DIFFERENTIAL/PLATELET
Abs Immature Granulocytes: 0.08 10*3/uL — ABNORMAL HIGH (ref 0.00–0.07)
Basophils Absolute: 0 10*3/uL (ref 0.0–0.1)
Basophils Relative: 1 %
Eosinophils Absolute: 0 10*3/uL (ref 0.0–0.5)
Eosinophils Relative: 0 %
HCT: 26.9 % — ABNORMAL LOW (ref 39.0–52.0)
Hemoglobin: 9.1 g/dL — ABNORMAL LOW (ref 13.0–17.0)
Immature Granulocytes: 2 %
Lymphocytes Relative: 5 %
Lymphs Abs: 0.3 10*3/uL — ABNORMAL LOW (ref 0.7–4.0)
MCH: 35.8 pg — ABNORMAL HIGH (ref 26.0–34.0)
MCHC: 33.8 g/dL (ref 30.0–36.0)
MCV: 105.9 fL — ABNORMAL HIGH (ref 80.0–100.0)
Monocytes Absolute: 0.2 10*3/uL (ref 0.1–1.0)
Monocytes Relative: 3 %
Neutro Abs: 4.6 10*3/uL (ref 1.7–7.7)
Neutrophils Relative %: 89 %
Platelets: 69 10*3/uL — ABNORMAL LOW (ref 150–400)
RBC: 2.54 MIL/uL — ABNORMAL LOW (ref 4.22–5.81)
RDW: 15.2 % (ref 11.5–15.5)
WBC: 5.1 10*3/uL (ref 4.0–10.5)
nRBC: 0 % (ref 0.0–0.2)

## 2021-09-12 LAB — BASIC METABOLIC PANEL
Anion gap: 9 (ref 5–15)
BUN: 37 mg/dL — ABNORMAL HIGH (ref 8–23)
CO2: 30 mmol/L (ref 22–32)
Calcium: 8.2 mg/dL — ABNORMAL LOW (ref 8.9–10.3)
Chloride: 95 mmol/L — ABNORMAL LOW (ref 98–111)
Creatinine, Ser: 2.11 mg/dL — ABNORMAL HIGH (ref 0.61–1.24)
GFR, Estimated: 30 mL/min — ABNORMAL LOW (ref >60)
Glucose, Bld: 100 mg/dL — ABNORMAL HIGH (ref 70–99)
Potassium: 4.1 mmol/L (ref 3.5–5.1)
Sodium: 134 mmol/L — ABNORMAL LOW (ref 135–145)

## 2021-09-12 LAB — URINALYSIS, COMPLETE (UACMP) WITH MICROSCOPIC
Bilirubin Urine: NEGATIVE
Glucose, UA: NEGATIVE mg/dL
Ketones, ur: NEGATIVE mg/dL
Nitrite: NEGATIVE
Protein, ur: 100 mg/dL — AB
Specific Gravity, Urine: 1.02 (ref 1.005–1.030)
WBC, UA: >50 WBC/hpf — ABNORMAL HIGH (ref 0–5)
pH: 5 (ref 5.0–8.0)

## 2021-09-12 LAB — TECHNOLOGIST SMEAR REVIEW

## 2021-09-12 LAB — PROTIME-INR
INR: 1.9 — ABNORMAL HIGH (ref 0.8–1.2)
Prothrombin Time: 21.6 seconds — ABNORMAL HIGH (ref 11.4–15.2)

## 2021-09-12 LAB — APTT: aPTT: 49 seconds — ABNORMAL HIGH (ref 24–36)

## 2021-09-12 MED ORDER — CHLORHEXIDINE GLUCONATE CLOTH 2 % EX PADS
6.0000 | MEDICATED_PAD | Freq: Every day | CUTANEOUS | Status: DC
  Administered 2021-09-12 – 2021-09-28 (×17): 6 via TOPICAL

## 2021-09-12 MED ORDER — PIPERACILLIN-TAZOBACTAM 3.375 G IVPB
3.3750 g | Freq: Three times a day (TID) | INTRAVENOUS | Status: DC
  Administered 2021-09-12 – 2021-09-13 (×3): 3.375 g via INTRAVENOUS
  Filled 2021-09-12 (×3): qty 50

## 2021-09-12 NOTE — Progress Notes (Signed)
[facility]  ROUNDING NOTE   Subjective:   [name] is an 87 year old male with a past medical history of chronic diastolic heart failure, rheumatoid arthritis, chronic hyponatremia, chronic indwelling Foley, and chronic kidney disease.  Patient presents to the emergency room with complaints of progressive fatigue.  He has been admitted for AKI (acute kidney injury) ([place]) [N17.9] Acute kidney injury ([place]) [N17.9] Hypotension, unspecified hypotension type [I95.9]  Patient is followed by our clinic and receives care from Dr. Holley Raring.  He reports of fatigue began about 5 days ago and has progressively gotten worse.  States he walks with a walker at home but in the past 1 to 2 days has been too weak to get out of bed.  Reports when he awoke this morning, only produced 75 cc of dark urine in Foley.  Reports Foley exchange every 4 weeks.  Denies cough and shortness of breath.  Denies nausea and vomiting.  Reports a decrease in appetite.  Family was unable to get patient to stand which prompted the call for EMS.  On arrival to ED, patient found to be hypotensive with other pertinent labs of BUN 36 and creatinine of 2.28.  He endorses taking medications as prescribed including Lasix 40 mg daily.  Chest x-ray shows mild pulmonary interstitial edema.  We have been consulted to evaluate acute kidney injury.   Objective:  Vital signs in last 24 hours:  Temp:  [98.1 F (36.7 C)-103 F (39.4 C)] 100.4 F (38 C) (10/16 0922) Pulse Rate:  [84-108] 86 (10/16 0922) Resp:  [16-29] 16 (10/16 0922) BP: (94-152)/(37-131) 112/44 (10/16 0922) SpO2:  [72 %-97 %] 91 % (10/16 0922) Weight:  [95 kg] 95 kg (10/15 1219)  Weight change:  Filed Weights   09/11/21 1219  Weight: 95 kg    Intake/Output: I/O last 3 completed shifts: In: 677.7 [I.V.:2.7; IV Piggyback:675] Out: 150 [Urine:150]   Intake/Output this shift:  No intake/output data recorded.  Physical Exam: General: NAD, resting in bed   Head: Normocephalic, atraumatic. Moist oral mucosal membranes  Eyes: Anicteric  Lungs:  Clear to auscultation, normal breathing effort  Heart: Regular rate and rhythm  Abdomen:  Soft, nontender,   Extremities:  1+ peripheral edema.  Neurologic: Nonfocal, moving all four extremities  Skin: No lesions  GU Chronic Foley     Basic Metabolic Panel: Recent Labs  Lab 09/11/21 1405 09/12/21 0524  NA 135 134*  K 4.4 4.1  CL 96* 95*  CO2 27 30  GLUCOSE 106* 100*  BUN 36* 37*  CREATININE 2.28* 2.11*  CALCIUM 8.7* 8.2*    Liver Function Tests: Recent Labs  Lab 09/11/21 1405  AST 46*  ALT 32  ALKPHOS 48  BILITOT 1.0  PROT 6.6  ALBUMIN 3.6   No results for input(s): LIPASE, AMYLASE in the last 168 hours. No results for input(s): AMMONIA in the last 168 hours.  CBC: Recent Labs  Lab 09/11/21 1405 09/12/21 0524  WBC 9.5 5.1  NEUTROABS 7.7 4.6  HGB 11.1* 9.1*  HCT 33.3* 26.9*  MCV 103.1* 105.9*  PLT 82* 69*    Cardiac Enzymes: No results for input(s): CKTOTAL, CKMB, CKMBINDEX, TROPONINI in the last 168 hours.  BNP: Invalid input(s): POCBNP  CBG: No results for input(s): GLUCAP in the last 168 hours.  Microbiology: Results for orders placed or performed during the hospital encounter of 09/11/21  Resp Panel by RT-PCR (Flu A&B, Covid) Urine, Catheterized     Status: None   Collection  Time: 09/11/21  3:41 PM   Specimen: Urine, Catheterized; Nasopharyngeal(NP) swabs in vial transport medium  Result Value Ref Range Status   SARS Coronavirus 2 by RT PCR NEGATIVE NEGATIVE Final    Comment: (NOTE) SARS-CoV-2 target nucleic acids are NOT DETECTED.  The SARS-CoV-2 RNA is generally detectable in upper respiratory specimens during the acute phase of infection. The lowest concentration of SARS-CoV-2 viral copies this assay can detect is 138 copies/mL. A negative result does not preclude SARS-Cov-2 infection and should not be used as the sole basis for treatment  or other patient management decisions. A negative result may occur with  improper specimen collection/handling, submission of specimen other than nasopharyngeal swab, presence of viral mutation(s) within the areas targeted by this assay, and inadequate number of viral copies(<138 copies/mL). A negative result must be combined with clinical observations, patient history, and epidemiological information. The expected result is Negative.  Fact Sheet for Patients:  EntrepreneurPulse.com.au  Fact Sheet for Healthcare Providers:  IncredibleEmployment.be  This test is no t yet approved or cleared by the [place] FDA and  has been authorized for detection and/or diagnosis of SARS-CoV-2 by FDA under an Emergency Use Authorization (EUA). This EUA will remain  in effect (meaning this test can be used) for the duration of the COVID-19 declaration under Section 564(b)(1) of the Act, 21 U.S.C.section 360bbb-3(b)(1), unless the authorization is terminated  or revoked sooner.       Influenza A by PCR NEGATIVE NEGATIVE Final   Influenza B by PCR NEGATIVE NEGATIVE Final    Comment: (NOTE) The Xpert Xpress SARS-CoV-2/FLU/RSV plus assay is intended as an aid in the diagnosis of influenza from Nasopharyngeal swab specimens and should not be used as a sole basis for treatment. Nasal washings and aspirates are unacceptable for Xpert Xpress SARS-CoV-2/FLU/RSV testing.  Fact Sheet for Patients: EntrepreneurPulse.com.au  Fact Sheet for Healthcare Providers: IncredibleEmployment.be  This test is not yet approved or cleared by the [place] FDA and has been authorized for detection and/or diagnosis of SARS-CoV-2 by FDA under an Emergency Use Authorization (EUA). This EUA will remain in effect (meaning this test can be used) for the duration of the COVID-19 declaration under Section 564(b)(1) of the Act, 21 U.S.C. section  360bbb-3(b)(1), unless the authorization is terminated or revoked.  Performed at [facility], [address] [place],  [id number]    *Note: Due to a large number of results and/or encounters for the requested time period, some results have not been displayed. A complete set of results can be found in Results Review.    Coagulation Studies: Recent Labs    09/12/21 0524  LABPROT 21.6*  INR 1.9*    Urinalysis: Recent Labs    09/11/21 1220  COLORURINE YELLOW*  LABSPEC 1.019  PHURINE 5.0  GLUCOSEU NEGATIVE  HGBUR MODERATE*  BILIRUBINUR NEGATIVE  KETONESUR 5*  PROTEINUR 100*  NITRITE NEGATIVE  LEUKOCYTESUR NEGATIVE      Imaging: DG Chest Portable 1 View  Result Date: 09/11/2021 CLINICAL DATA:  Weakness, evaluate for edema EXAM: PORTABLE CHEST 1 VIEW COMPARISON:  Chest radiograph 01/18/2021 FINDINGS: The heart is at the upper limits of normal for size. The mediastinal contours are stable, with unchanged calcified atherosclerotic plaque of the aortic arch. There is vascular congestion and mild pulmonary interstitial edema. There is no focal consolidation. There is no pleural effusion. There is no pneumothorax. An accessory azygous fissure is again noted. There is marked degenerative change of the shoulders. IMPRESSION: Mild pulmonary interstitial  edema. Electronically Signed   By: Valetta Mole M.D.   On: 09/11/2021 16:06     Medications:    sodium chloride 50 mL/hr at 09/11/21 1853   piperacillin-tazobactam (ZOSYN)  IV      apixaban  2.5 mg Oral BID   Chlorhexidine Gluconate Cloth  6 each Topical Daily   fluticasone  2 spray Each Nare BID   folic acid  6,568 mcg Oral Daily   gabapentin  300 mg Oral BID AC & HS   hydroxychloroquine  200 mg Oral BID   mirabegron ER  50 mg Oral Daily   pantoprazole  40 mg Oral Daily   polyethylene glycol  17 g Oral QHS   pravastatin  20 mg Oral QHS   vitamin B-12  1,000 mcg Oral Daily   acetaminophen **OR**  acetaminophen, ALPRAZolam, chlorhexidine, ondansetron **OR** ondansetron (ZOFRAN) IV  Assessment/ Plan:  [name] is a 87 y.o.  male with a past medical history of chronic diastolic heart failure, rheumatoid arthritis, chronic hyponatremia, chronic indwelling Foley, and chronic kidney disease.  Patient presents to the emergency room with complaints of progressive fatigue.  He has been admitted for AKI (acute kidney injury) ([place]) [N17.9] Acute kidney injury ([place]) [N17.9] Hypotension, unspecified hypotension type [I95.9]   Acute Kidney Injury on chronic kidney disease stage 3a with baseline creatinine 1.2 and GFR of 58 on 08/10/21.  Acute kidney injury secondary to believed overdiuresis with poor appetite Chronic kidney disease is secondary to right nephrectomy No IV contrast exposure.  No indication for dialysis at this time.  Agree to continue to hold diuretic.  Continue IV fluids for hydration for 1 to 2 days.  Will monitor closely for signs of fluid overload.  Lab Results  Component Value Date   CREATININE 2.11 (H) 09/12/2021   CREATININE 2.28 (H) 09/11/2021   CREATININE 1.14 05/26/2021    Intake/Output Summary (Last 24 hours) at 09/12/2021 1159 Last data filed at 09/11/2021 2255 Gross per 24 hour  Intake 677.72 ml  Output 150 ml  Net 527.72 ml   2.  Chronic diastolic heart failure: Echo from June 2021 shows EF of 60 to 65%.  Currently followed by cardiology outpatient     LOS: 1 [place] 10/16/202211:59 AM

## 2021-09-12 NOTE — Consult Note (Signed)
Pharmacy Antibiotic Note  [name] is a 87 y.o. male with medical history including RA on Plaquenil, Afib, diastolic CHF, CAD, IBS / diverticulitis, neurogenic bladder, prostate cancer, renal mass admitted on 09/11/2021 with sepsis.  Pharmacy has been consulted for Zosyn dosing.  Plan:  Zosyn 3.375 g IV q8h (4-hr infusion)  Weight: 95 kg (209 lb 7 oz)  Temp (24hrs), Avg:99.8 F (37.7 C), Min:98.1 F (36.7 C), Max:103 F (39.4 C)  Recent Labs  Lab 09/11/21 1405 09/11/21 1837 09/12/21 0524  WBC 9.5  --  5.1  CREATININE 2.28*  --  2.11*  LATICACIDVEN  --  1.6  --     Estimated Creatinine Clearance: 29.1 mL/min (A) (by C-G formula based on SCr of 2.11 mg/dL (H)).    Allergies  Allergen Reactions   Ciprofloxacin Nausea And Vomiting    Headache   Citalopram Hydrobromide Other (See Comments)    "unknown"   Clindamycin Nausea And Vomiting   Lorazepam Other (See Comments)    Adverse reaction   Paroxetine Nausea Only   Ramipril Other (See Comments)    "unknown"   Simvastatin    Sulfa Antibiotics Other (See Comments)    Antimicrobials this admission: Zosyn 10/16 >>   Dose adjustments this admission: N/A  Microbiology results: 10/16 BCx: pending 10/16 UCx: pending   Thank you for allowing pharmacy to be a part of this patient's care.  Benita Gutter 09/12/2021 8:58 AM

## 2021-09-12 NOTE — Evaluation (Addendum)
Occupational Therapy Evaluation Patient Details Name: [name] MRN: [id number] DOB: 14-Apr-1934 Today's Date: 09/12/2021   History of Present Illness [name]  is a 87 y.o. male with a known history of chronic diastolic congestive heart failure, indwelling Foley catheter, rheumatoid arthritis, chronic hyponatremia, chronic kidney disease. He has been exhausted lately and having trouble sleeping, and has been weak and struggling to get up and down. In the ER he was hypotensive with a blood pressure of 94/37 on 1 occasion.  He was dehydrated with a BUN of 36 and a creatinine up to 2.28.   Clinical Impression   Pt seen for OT evaluation this date. Upon arrival to room, pt sitting upright in bed following assist from nurse tech for peri-care. Pt agreeable to OT evaluation/tx. Prior to admission, pt was living with his wife and daughter, on the main level of a 2-level home. Pt reports that he is independent with feeding and toilet transfers/hygiene at baseline. Pt reports that his daughter has been assisting with dressing, bathing, and IADLs for past 2-3 years. Pt currently requires MAX-TOTAL A for bed mobility, SET-UP for eating/feeding, MOD A for seated grooming tasks, and MAX A for seated LB dressing due to current functional impairments (See OT Problem List below). After participating in seated ADLs at EOB, pt reporting fatigue and pain in lower back and requesting to return to supine (of note, pt required MAX A to maintain static standing balance with PT this AM). Pt would benefit from additional skilled OT services to maximize return to PLOF and minimize risk of future falls, injury, caregiver burden, and readmission. Upon discharge, recommend SNF.      Recommendations for follow up therapy are one component of a multi-disciplinary discharge planning process, led by the attending physician.  Recommendations may be updated based on patient status, additional functional criteria and insurance  authorization.   Follow Up Recommendations  SNF    Equipment Recommendations  Other (comment) (defer to next venue of care)       Precautions / Restrictions Precautions Precautions: Fall Restrictions Weight Bearing Restrictions: No      Mobility Bed Mobility Overal bed mobility: Needs Assistance Bed Mobility: Supine to Sit;Sit to Supine     Supine to sit: Max assist Sit to supine: Total assist   General bed mobility comments: MaxA for supine to sit with HOB elevated. Total A for return to supine. +2 for repositioning towards HOB with bed in trendelenburg position    Transfers       General transfer comment: did not attempt as pt was fatigued and c/o of back pain after sitting EOB for 5-10 mins    Balance Overall balance assessment: Needs assistance Sitting-balance support: Single extremity supported;Feet supported Sitting balance-[name]: Fair Sitting balance - Comments: MIN GUARD for dynamic sitting balance at EOB. Pt with intermittent posterior lean, however able to correct following cues for body positioning Postural control: Posterior lean                           ADL either performed or assessed with clinical judgement   ADL Overall ADL's : Needs assistance/impaired Eating/Feeding: Set up;Sitting Eating/Feeding Details (indicate cue type and reason): to bring medication/cup to mouth Grooming: Moderate assistance;Sitting Grooming Details (indicate cue type and reason): SET-UP assist to don glasses. MAX A to don hearing aides             Lower Body Dressing: Maximal assistance;Sitting/lateral leans Lower  Body Dressing Details (indicate cue type and reason): to don socks while seated EOB                     Vision Baseline Vision/History: 1 Wears glasses Patient Visual Report: No change from baseline              Pertinent Vitals/Pain Pain Assessment: No/denies pain Faces Pain Scale: Hurts a little bit Pain Location: b/l  heels Pain Descriptors / Indicators: Aching;Discomfort Pain Intervention(s): Monitored during session;Repositioned;Limited activity within patient's tolerance     Hand Dominance Right   Extremity/Trunk Assessment Upper Extremity Assessment Upper Extremity Assessment: RUE deficits/detail;LUE deficits/detail RUE Deficits / Details: Shoulder flexion AROM 0-30 (aprox) degrees; Elbow flexion/extension 3/5; Grip strength 4/5 RUE Sensation: WNL RUE Coordination: decreased fine motor;decreased gross motor LUE Deficits / Details: Shoulder flexion AROM 0-30 (aprox) degrees; Elbow flexion/extension 3/5; mild ulnar drift present in digits 3-5 (hx of rheumatoid arthritis) LUE Sensation: WNL LUE Coordination: decreased fine motor;decreased gross motor   Lower Extremity Assessment Lower Extremity Assessment: Generalized weakness;Defer to PT evaluation        Communication Communication Communication: No difficulties   Cognition Arousal/Alertness: Awake/alert Behavior During Therapy: WFL for tasks assessed/performed Overall Cognitive Status: No family/caregiver present to determine baseline cognitive functioning                                 General Comments: Pt A&Ox4, however demonstrates some memory and attention deficits, requiring verbal cues to attend to tasks              Home Living Family/patient expects to be discharged to:: Private residence Living Arrangements: Spouse/significant other;Children Available Help at Discharge: Family;Available 24 hours/day (Spouse is limited in her physical abilities to assist him) Type of Home: House Home Access: Stairs to enter [employer] of Steps: 3 Entrance Stairs-Rails: None Home Layout: Two level;Able to live on main level with bedroom/bathroom               Home Equipment: Gilford Rile - 2 wheels;Shower seat   Additional Comments: Pt reports that he has a lift to get up the stairs to enter the home       Prior Functioning/Environment Level of Independence: Needs assistance  Gait / Transfers Assistance Needed: Requires assistance for ambulation of short distances and with transfers ADL's / Homemaking Assistance Needed: Pt reports being independent with feeding and toilet transfers/hygiene. Pt's daughter performs all IADLs, and provides MAX A for dressing and MOD A for sit>stand bathing.            OT Problem List: Decreased strength;Decreased range of motion;Decreased activity tolerance;Impaired balance (sitting and/or standing);Decreased safety awareness;Pain;Impaired UE functional use      OT Treatment/Interventions: Self-care/ADL training;Therapeutic exercise;Energy conservation;DME and/or AE instruction;Therapeutic activities;Patient/family education;Balance training    OT Goals(Current goals can be found in the care plan section) Acute Rehab OT Goals Patient Stated Goal: to return home with family OT Goal Formulation: With patient Time For Goal Achievement: 09/26/21 Potential to Achieve Goals: Fair ADL Goals Pt Will Perform Upper Body Bathing: with set-up;with supervision;sitting Pt Will Perform Lower Body Bathing: with mod assist;sit to/from stand Pt Will Transfer to Toilet: with mod assist;stand pivot transfer;bedside commode  OT Frequency: Min 1X/week    AM-PAC OT "6 Clicks" Daily Activity     Outcome Measure Help from another person eating meals?: None Help from another person taking care of personal grooming?:  A Lot Help from another person toileting, which includes using toliet, bedpan, or urinal?: A Lot Help from another person bathing (including washing, rinsing, drying)?: A Lot Help from another person to put on and taking off regular upper body clothing?: A Lot Help from another person to put on and taking off regular lower body clothing?: Total 6 Click Score: 13   End of Session Nurse Communication: Mobility status  Activity Tolerance: Patient tolerated treatment  well Patient left: in bed;with call bell/phone within reach;with bed alarm set  OT Visit Diagnosis: Muscle weakness (generalized) (M62.81);Unsteadiness on feet (R26.81)                Time: 1004-1040 OT Time Calculation (min): 36 min Charges:  OT General Charges $OT Visit: 1 Visit OT Evaluation $OT Eval Moderate Complexity: 1 Mod OT Treatments $Self Care/Home Management : 8-22 mins $Therapeutic Activity: 8-22 mins  Fredirick Maudlin, OTR/L [place]

## 2021-09-12 NOTE — Evaluation (Signed)
Physical Therapy Evaluation Patient Details Name: BARTLETT ENKE MRN: [id number] DOB: 04-13-1934 Today's Date: 09/12/2021  History of Present Illness  Markis Langland  is a 87 y.o. male with a known history of chronic diastolic congestive heart failure, indwelling Foley catheter, rheumatoid arthritis, chronic hyponatremia, chronic kidney disease. He has been exhausted lately and having trouble sleeping, and has been weak and struggling to get up and down. In the ER he was hypotensive with a blood pressure of 94/37 on 1 occasion.  He was dehydrated with a BUN of 36 and a creatinine up to 2.28.   Clinical Impression  Pt is a pleasant 87 year old male who presents to PT evaluation after admission for acute on chronic kidney disease and possible UTI. Prior to admission, pt reports ambulating short distances in the home with RW and assistance from daughter who also assists with ADLs. Pt reports having a lift to get into and out of his home and that his daughter drives him to appointments. Upon evaluation, pt requiring max - total assistance with bed mobility and max assistance to stand at EOB. Pt demonstrating significant posterior lean in standing and unable to weight shift anteriorly. Recommending SNF at discharge to improve mobility and balance to decreased caregiver burden. Will continue to see patient during acute hospitalization to further functional mobility.     Recommendations for follow up therapy are one component of a multi-disciplinary discharge planning process, led by the attending physician.  Recommendations may be updated based on patient status, additional functional criteria and insurance authorization.  Follow Up Recommendations SNF    Equipment Recommendations  None recommended by PT    Recommendations for Other Services       Precautions / Restrictions Precautions Precautions: Fall Restrictions Weight Bearing Restrictions: No      Mobility  Bed Mobility Overal bed mobility:  Needs Assistance Bed Mobility: Supine to Sit;Sit to Supine     Supine to sit: Max assist Sit to supine: Total assist   General bed mobility comments: MaxA for supine to sit with HOB elevated. Total A for return to supine. +2 for repositioning towards HOB with bed in trendelenburg position    Transfers Overall transfer level: Needs assistance Equipment used: Rolling walker (2 wheeled) Transfers: Sit to/from Stand Sit to Stand: Max assist;From elevated surface         General transfer comment: did not attempt as pt was fatigued and c/o of back pain after sitting EOB for 5-10 mins  Ambulation/Gait             General Gait Details: Deferred due to safety  Stairs            Wheelchair Mobility    Modified Rankin (Stroke Patients Only)       Balance Overall balance assessment: Needs assistance Sitting-balance support: Single extremity supported;Feet supported Sitting balance-[name]: Fair Sitting balance - Comments: MIN GUARD for dynamic sitting balance at EOB. Pt with intermittent posterior lean, however able to correct following cues for body positioning Postural control: Posterior lean Standing balance support: Bilateral upper extremity supported;During functional activity Standing balance-[name]: Zero Standing balance comment: Unable to fully reach upright position. Significant posterior lean in standing requiring maxA to maintain position with B UE assist.                             Pertinent Vitals/Pain Pain Assessment: No/denies pain Faces Pain Scale: Hurts a little bit Pain  Location: b/l heels Pain Descriptors / Indicators: Aching;Discomfort Pain Intervention(s): Monitored during session;Repositioned;Limited activity within patient's tolerance    Home Living Family/patient expects to be discharged to:: Private residence Living Arrangements: Spouse/significant other;Children Available Help at Discharge: Family;Available 24  hours/day (Spouse is limited in her physical abilities to assist him) Type of Home: House Home Access: Stairs to enter Entrance Stairs-Rails: None Entrance Stairs-Number of Steps: 3 Home Layout: Two level;Able to live on main level with bedroom/bathroom Home Equipment: Gilford Rile - 2 wheels;Shower seat Additional Comments: Pt reports that he has a lift to get up the stairs to enter the home    Prior Function Level of Independence: Needs assistance   Gait / Transfers Assistance Needed: Requires assistance for ambulation of short distances and with transfers  ADL's / Homemaking Assistance Needed: Pt reports being independent with feeding and toilet transfers/hygiene. Pt's daughter performs all IADLs, and provides MAX A for dressing and MOD A for sit>stand bathing.        Hand Dominance   Dominant Hand: Right    Extremity/Trunk Assessment   Upper Extremity Assessment Upper Extremity Assessment: RUE deficits/detail;LUE deficits/detail RUE Deficits / Details: Shoulder flexion AROM 0-30 (aprox) degrees; Elbow flexion/extension 3/5; Grip strength 4/5 RUE Sensation: WNL RUE Coordination: decreased fine motor;decreased gross motor LUE Deficits / Details: Shoulder flexion AROM 0-30 (aprox) degrees; Elbow flexion/extension 3/5; mild ulnar drift present in digits 3-5 (hx of rheumatoid arthritis) LUE Sensation: WNL LUE Coordination: decreased fine motor;decreased gross motor    Lower Extremity Assessment Lower Extremity Assessment: Generalized weakness;Defer to PT evaluation RLE Deficits / Details: Decreased full AROM/PROM in all joints of LE. Hx of RA and knee replacement. Grossly 2/5 RLE Sensation: WNL LLE Deficits / Details: Decreased full AROM/PROM in all joints of LE. Hx of RA and knee replacement. Grossly 2/5 LLE Sensation: WNL       Communication   Communication: No difficulties  Cognition Arousal/Alertness: Awake/alert Behavior During Therapy: WFL for tasks  assessed/performed Overall Cognitive Status: No family/caregiver present to determine baseline cognitive functioning      General Comments: Pt A&Ox4, however demonstrates some memory and attention deficits, requiring verbal cues to attend to tasks      General Comments      Exercises Other Exercises Other Exercises: Pt requiring maxA for rolling in bed towards his L to place bed pan for patient to have BM. Pt able to roll himself back to supine without assistance.   Assessment/Plan    PT Assessment Patient needs continued PT services  PT Problem List Decreased strength;Decreased range of motion;Decreased activity tolerance;Decreased balance;Decreased mobility;Decreased coordination;Decreased cognition;Decreased knowledge of use of DME;Decreased safety awareness;Decreased knowledge of precautions       PT Treatment Interventions DME instruction;Gait training;Stair training;Functional mobility training;Therapeutic activities;Therapeutic exercise;Balance training;Neuromuscular re-education;Patient/family education;Wheelchair mobility training;Manual techniques;Modalities    PT Goals (Current goals can be found in the Care Plan section)  Acute Rehab PT Goals Patient Stated Goal: to return home with family PT Goal Formulation: With patient Time For Goal Achievement: 09/26/21 Potential to Achieve Goals: Fair    Frequency Min 2X/week   Barriers to discharge        Co-evaluation               AM-PAC PT "6 Clicks" Mobility  Outcome Measure Help needed turning from your back to your side while in a flat bed without using bedrails?: A Lot Help needed moving from lying on your back to sitting on the side of a flat bed without  using bedrails?: A Lot Help needed moving to and from a bed to a chair (including a wheelchair)?: Total Help needed standing up from a chair using your arms (e.g., wheelchair or bedside chair)?: A Lot Help needed to walk in hospital room?: Total Help  needed climbing 3-5 steps with a railing? : Total 6 Click Score: 9    End of Session Equipment Utilized During Treatment: Gait belt Activity Tolerance: Patient limited by fatigue Patient left: in bed;with call bell/phone within reach;with bed alarm set Nurse Communication: Mobility status;Other (comment) (Pt position on bed pan at end of session) PT Visit Diagnosis: Unsteadiness on feet (R26.81);Other abnormalities of gait and mobility (R26.89);Muscle weakness (generalized) (M62.81)    Time: 6184-8592 PT Time Calculation (min) (ACUTE ONLY): 28 min   Charges:   PT Evaluation $PT Eval Moderate Complexity: 1 Mod PT Treatments $Therapeutic Activity: 8-22 mins        Andrey Campanile, SPT   Andrey Campanile 09/12/2021, 12:56 PM

## 2021-09-12 NOTE — Plan of Care (Signed)
  Problem: Health Behavior/Discharge Planning: Goal: Ability to manage health-related needs will improve Outcome: Progressing   

## 2021-09-12 NOTE — Progress Notes (Signed)
Patient ID: [name], male   DOB: August 22, 1934, 87 y.o.   MRN: [id number] [facility] PROGRESS NOTE  [name] YKD:[id number] DOB: 1933/12/18 DOA: 09/11/2021 PCP: [name], MD  HPI/Subjective: Patient spiked a fever of 103 this morning.  Felt cold and had a lot of blankets on him.  Patient states that his urine output has not been that much and requested his kidney doctor to see him.  No nausea or vomiting.  Still feels weak and does not think he will be able to stand.  Had hypotension yesterday with acute kidney injury.  Daughter thinks that he is a little confused at times.  Objective: Vitals:   09/12/21 0922 09/12/21 1209  BP: (!) 112/44 (!) 108/40  Pulse: 86 75  Resp: 16 17  Temp: (!) 100.4 F (38 C) 98.6 F (37 C)  SpO2: 91% 94%    Intake/Output Summary (Last 24 hours) at 09/12/2021 1237 Last data filed at 09/11/2021 2255 Gross per 24 hour  Intake 677.72 ml  Output 150 ml  Net 527.72 ml   Filed Weights   09/11/21 1219  Weight: 95 kg    ROS: Review of Systems  Constitutional:  Positive for chills and fever.  Respiratory:  Negative for shortness of breath.   Cardiovascular:  Negative for chest pain.  Gastrointestinal:  Negative for abdominal pain, nausea and vomiting.  Exam: Physical Exam HENT:     Head: Normocephalic.     Mouth/Throat:     Pharynx: No oropharyngeal exudate.  Eyes:     General: Lids are normal.     Conjunctiva/sclera: Conjunctivae normal.  Cardiovascular:     Rate and Rhythm: Normal rate and regular rhythm.     Heart sounds: Normal heart sounds, S1 normal and S2 normal.  Pulmonary:     Breath sounds: No decreased breath sounds, wheezing, rhonchi or rales.  Abdominal:     Palpations: Abdomen is soft.     Tenderness: There is no abdominal tenderness.  Musculoskeletal:     Right ankle: Swelling present.     Left ankle: Swelling present.  Skin:    General: Skin is warm.     Comments: Chronic lower extremity skin  discoloration  Neurological:     Mental Status: He is alert and oriented to person, place, and time.      Scheduled Meds:  apixaban  2.5 mg Oral BID   Chlorhexidine Gluconate Cloth  6 each Topical Daily   fluticasone  2 spray Each Nare BID   folic acid  3,976 mcg Oral Daily   gabapentin  300 mg Oral BID AC & HS   hydroxychloroquine  200 mg Oral BID   mirabegron ER  50 mg Oral Daily   pantoprazole  40 mg Oral Daily   polyethylene glycol  17 g Oral QHS   pravastatin  20 mg Oral QHS   vitamin B-12  1,000 mcg Oral Daily   Continuous Infusions:  sodium chloride 50 mL/hr at 09/11/21 1853   piperacillin-tazobactam (ZOSYN)  IV 3.375 g (09/12/21 1226)    Assessment/Plan:  Fever of 103 with hypotension yesterday.  Patient's daughter had noted some intermittent confusion.  At this point, we will start empiric Zosyn.  I did not want to treat catheter related infection yesterday because the patient was not septic and nitrites and leukocyte esterase was negative.  At this point with fever will send off urine culture and blood cultures.  Urology to change out Foley catheter tomorrow.  Continue  to hold antihypertensive medication. Acute kidney injury on chronic kidney disease stage II.  Creatinine 2.28 on presentation.  Baseline creatinine 1.14.  Continue gentle IV fluids.  Creatinine 2.11 today.  Patient requested nephrology consultation with Dr. Holley Raring, his kidney doctor. Thrombocytopenia.  Platelet count lower today.  Pathologist peripheral smear pending.  Called the lab and the tech is going to look at the smear today.  Holding methotrexate.  Continue folic acid.  Continue to watch platelet count closely. Paroxysmal atrial fibrillation.  Continue lower dose Eliquis 2.5 mg twice a day. Chronic diastolic congestive heart failure.  Holding Lasix with acute kidney injury. Rheumatoid arthritis on Plaquenil.  Holding methotrexate Chronic hyponatremia.  Sodium 134 today PT and OT  evaluations Hyperlipidemia unspecified on pravastatin Neuropathy.  Decrease dose of gabapentin with acute kidney injury.        Code Status:     Code Status Orders  (From admission, onward)           Start     Ordered   09/11/21 1755  Full code  Continuous        09/11/21 1755           Code Status History     Date Active Date Inactive Code Status Order ID Comments User Context   06/17/2019 1509 06/23/2019 2210 Full Code [id number]  Hollice Espy, MD Inpatient   10/28/2018 2259 10/31/2018 1654 Full Code [id number]  Lance Coon, MD Inpatient   05/25/2018 2236 05/26/2018 1653 Full Code [id number]  Lance Coon, MD Inpatient   10/18/2017 2238 10/22/2017 2015 Full Code [id number]  Salary, Avel Peace, MD Inpatient   04/03/2017 0632 04/05/2017 1808 Full Code [id number]  Harrie Foreman, MD Inpatient   12/11/2016 2139 12/15/2016 1920 Full Code [id number]  Demetrios Loll, MD Inpatient   09/07/2015 1505 09/08/2015 1744 Full Code [id number]  Dustin Flock, MD Inpatient   09/02/2015 1716 09/05/2015 1652 Full Code [id number]  Hooten, Laurice Record, MD Inpatient      Family Communication: Updated daughter on the phone Disposition Plan: Status is: Inpatient  Consultants: Nephrology Urology to change Foley on 09/13/2021  Antibiotics: Starting Zosyn  Time spent: 28 minutes  [place]

## 2021-09-12 NOTE — NC FL2 (Signed)
[place] LEVEL OF CARE SCREENING TOOL     IDENTIFICATION  Patient Name: [name] Birthdate: 1934-06-11 Sex: male Admission Date (Current Location): 09/11/2021  [facility] and [place] Number:  Engineering geologist and Address:  [facility], [address] [place], [place] [address]      Provider Number: [id number]  Attending Physician Name and Address:  Loletha Grayer, MD  Relative Name and Phone Number:  Arm[place], Cech (Daughter)   [phone] [facility])    Current Level of Care: Hospital Recommended Level of Care: [place] Prior Approval Number:    Date Approved/Denied:   PASRR Number:    Discharge Plan:      Current Diagnoses: Patient Active Problem List   Diagnosis Date Noted   Acute kidney injury superimposed on CKD ([place]) 09/11/2021   Hypotension    Thrombocytopenia (HCC)    Chronic diastolic CHF (congestive heart failure) ([place])    Weakness    Neuropathy    Severe persistent asthma 02/05/2021   Postprocedure pain management follow-up 12/08/2020   Spondylosis without myelopathy or radiculopathy, lumbosacral region 11/24/2020   Elevated C-reactive protein (CRP) 11/24/2020   Elevated sed rate 11/24/2020   Chronic pain syndrome 11/04/2020   Pharmacologic therapy 11/04/2020   Disorder of skeletal system 11/04/2020   Problems influencing health status 11/04/2020   Abnormal MRI, lumbar spine (09/21/2020) 11/04/2020   Chronic low back pain (Bilateral) w/o sciatica 11/04/2020   DDD (degenerative disc disease), lumbosacral 11/04/2020   Lumbosacral foraminal stenosis 11/04/2020   Lumbar facet hypertrophy 11/04/2020   Levoscoliosis of lumbosacral spine 11/04/2020   Grade 1 Retrolisthesis at L2-3 and L3-4 11/04/2020   Chronic anticoagulation (Eliquis) 11/04/2020   Lumbar facet syndrome 11/04/2020   Long term prescription benzodiazepine use 11/04/2020   Chronic shoulder pain (Bilateral) 11/04/2020   Chronic  elbow pain (Bilateral) 11/04/2020   Chronic wrist pain (Bilateral) 11/04/2020   Chronic sinusitis 07/12/2020   Persistent atrial fibrillation ([place]) 07/12/2020   Anemia in chronic kidney disease 09/12/2019   Edema of lower extremity 09/12/2019   Hyposmolality and/or hyponatremia 09/12/2019   Renal cell carcinoma ([place]) 07/11/2019   AF (paroxysmal atrial fibrillation) ([place]) 05/09/2019   History of TIA (transient ischemic attack) 12/06/2018   Vision changes 12/06/2018   TIA (transient ischemic attack) 06/06/2018   Carotid artery disease ([place]) 06/06/2018   Frailty 04/24/2018   Long term current use of immunosuppressive drug 04/24/2018   Obesity (BMI 30-39.9) 04/24/2018   Fever 11/02/2017   SIADH (syndrome of inappropriate ADH production) ([place]) 04/17/2017   AKI (acute kidney injury) ([place]) 04/03/2017   Hyponatremia 01/16/2017   Urinary tract infection associated with indwelling urethral catheter ([place]) 12/19/2016   Asthma with acute exacerbation 11/14/2016   Mild intermittent asthma without complication 37/08/6268   Mood disorder ([place]) 01/18/2016   Presence of indwelling urinary catheter 08/20/2015   Advanced directives, counseling/discussion 07/28/2014   Routine general medical examination at a health care facility 04/09/2012   Venous stasis dermatitis 06/13/2011   Sleep disturbance 12/21/2010   CONSTIPATION, CHRONIC 12/07/2010   Chronic constipation 12/07/2010   Hyperlipidemia 12/02/2010   OSTEOARTHRITIS 12/02/2010   Neurogenic bladder 05/24/2010   IBS 05/05/2010   History of colonic polyps 05/05/2010   NEUROPATHY 05/03/2010   Mononeuritis 05/03/2010   Personal history of prostate cancer 03/04/2010   Benign essential hypertension 03/25/2009   GAD (generalized anxiety disorder) 01/27/2007   Coronary atherosclerosis of native coronary artery 01/27/2007   Allergic asthma 01/27/2007   GERD  01/27/2007   Chronic rheumatic arthritis ([place]) 01/27/2007   Rheumatoid arthritis involving  multiple sites with positive rheumatoid factor ([place]) 01/27/2007    Orientation RESPIRATION BLADDER Height & Weight     Self, Time, Situation, Place  Normal Continent Weight: 209 lb 7 oz (95 kg) Height:     BEHAVIORAL SYMPTOMS/MOOD NEUROLOGICAL BOWEL NUTRITION STATUS      Continent Diet (heart healthy/carb modified, thin liquids)  AMBULATORY STATUS COMMUNICATION OF NEEDS Skin   Total Care   Bruising                       Personal Care Assistance Level of Assistance  Bathing, Feeding, Dressing Bathing Assistance: Maximum assistance Feeding assistance: Limited assistance Dressing Assistance: Maximum assistance     Functional Limitations Info             SPECIAL CARE FACTORS FREQUENCY  PT (By licensed PT), OT (By licensed OT)     PT Frequency: 5 times per week OT Frequency: 5 times per week            Contractures      Additional Factors Info  Code Status, Allergies Code Status Info: full Allergies Info: Ciprofloxacin, Citalopram Hydrobromide, Clindamycin, Lorazepam, Paroxetine, Ramipril, Simvastatin, Sulfa Antibiotics           Current Medications (09/12/2021):  This is the current hospital active medication list Current Facility-Administered Medications  Medication Dose Route Frequency Provider Last Rate Last Admin   0.9 %  sodium chloride infusion   Intravenous Continuous Loletha Grayer, MD 50 mL/hr at 09/11/21 [place] at 09/11/21 1853   acetaminophen (TYLENOL) tablet 650 mg  650 mg Oral Q6H PRN Loletha Grayer, MD   650 mg at 09/12/21 1008   Or   acetaminophen (TYLENOL) suppository 650 mg  650 mg Rectal Q6H PRN Loletha Grayer, MD       ALPRAZolam Duanne Moron) tablet 0.25 mg  0.25 mg Oral TID PRN Loletha Grayer, MD       apixaban Arne Cleveland) tablet 2.5 mg  2.5 mg Oral BID Wieting, Richard, MD   2.5 mg at 09/12/21 1008   chlorhexidine (PERIDEX) 0.12 % solution 1 mL  1 mL Mouth Rinse PRN Loletha Grayer, MD       Chlorhexidine Gluconate Cloth 2 %  PADS 6 each  6 each Topical Daily Loletha Grayer, MD   6 each at 09/12/21 1008   fluticasone (FLONASE) 50 MCG/ACT nasal spray 2 spray  2 spray Each Nare BID Loletha Grayer, MD   2 spray at 72/53/66 4403   folic acid (FOLVITE) tablet 1 mg  1,000 mcg Oral Daily Wieting, Richard, MD   1 mg at 09/12/21 1007   gabapentin (NEURONTIN) tablet 300 mg  300 mg Oral BID AC & HS Wieting, Richard, MD       hydroxychloroquine (PLAQUENIL) tablet 200 mg  200 mg Oral BID Leslye Peer, Richard, MD   200 mg at 09/12/21 1008   mirabegron ER (MYRBETRIQ) tablet 50 mg  50 mg Oral Daily Wieting, Richard, MD       ondansetron [facility]) tablet 4 mg  4 mg Oral Q6H PRN Wieting, Richard, MD       Or   ondansetron (ZOFRAN) injection 4 mg  4 mg Intravenous Q6H PRN Wieting, Richard, MD       pantoprazole (PROTONIX) EC tablet 40 mg  40 mg Oral Daily Loletha Grayer, MD   40 mg at 09/12/21 1008   piperacillin-tazobactam (ZOSYN) IVPB 3.375 g  3.375 g Intravenous Q8H Benita Gutter, RPH 12.5 mL/hr at 09/12/21 1226 3.375 g at 09/12/21 1226   polyethylene glycol (MIRALAX / GLYCOLAX) packet 17 g  17 g Oral QHS Loletha Grayer, MD   17 g at 09/11/21 2112   pravastatin (PRAVACHOL) tablet 20 mg  20 mg Oral QHS Wieting, Richard, MD       vitamin B-12 (CYANOCOBALAMIN) tablet 1,000 mcg  1,000 mcg Oral Daily Loletha Grayer, MD   1,000 mcg at 09/12/21 1007     Discharge Medications: Please see discharge summary for a list of discharge medications.  Relevant Imaging Results:  Relevant Lab Results:   Additional Information SS #: [id number]  [place], LCSW

## 2021-09-12 NOTE — TOC Initial Note (Signed)
Transition of Care [facility]) - Initial/Assessment Note    Patient Details  Name: [name] MRN: [id number] Date of Birth: 1934-07-07  Transition of Care [facility]) CM/SW Contact:    Magnus Ivan, LCSW Phone Number: 09/12/2021, 3:46 PM  Clinical Narrative:                Patient is from home with his mom and daughter. Daughter provides transportation. PCP is Dr. Silvio Pate. Patient has had 2 COVID vaccines and 1 booster. Patient has a RW, raised toilet seat, adjustable mattress, lift recliner, and stair lift into home. Patient had HHPT in the past with [facility]. Patient has been to SNF for rehab at [facility], [facility], and [facility]. He is agreeable to SNF rec and prefers [facility]. Patient doesn't want [facility] and daughter understands [facility] is no longer taking patients from the community. They understand it is based on availability and bed offers. CSW is starting work up.   Expected Discharge Plan: Skilled Nursing Facility Barriers to Discharge: Continued Medical Work up   Patient Goals and CMS Choice Patient states their goals for this hospitalization and ongoing recovery are:: SNF CMS Medicare.gov Compare Post Acute Care list provided to:: Patient Represenative (must comment) Choice offered to / list presented to : Adult Children, Patient  Expected Discharge Plan and Services Expected Discharge Plan: [place]       Living arrangements for the past 2 months: Single Family Home                                      Prior Living Arrangements/Services Living arrangements for the past 2 months: Single Family Home Lives with:: Spouse, Adult Children Patient language and need for interpreter reviewed:: Yes Do you feel safe going back to the place where you live?: Yes      Need for Family Participation in Patient Care: Yes (Comment) Care giver support system in place?: Yes (comment) Current home services: DME Criminal Activity/Legal Involvement Pertinent to  Current Situation/Hospitalization: No - Comment as needed  Activities of Daily Living      Permission Sought/Granted Permission sought to share information with : Facility Sport and exercise psychologist, Family Supports Permission granted to share information with : Yes, Verbal Permission Granted     Permission granted to share info w AGENCY: SNFs        Emotional Assessment       Orientation: : Oriented to Self, Oriented to Place, Oriented to  Time, Oriented to Situation Alcohol / Substance Use: Not Applicable Psych Involvement: No (comment)  Admission diagnosis:  AKI (acute kidney injury) ([place]) [N17.9] Acute kidney injury ([place]) [N17.9] Hypotension, unspecified hypotension type [I95.9] Patient Active Problem List   Diagnosis Date Noted   Acute kidney injury superimposed on CKD ([place]) 09/11/2021   Hypotension    Thrombocytopenia (HCC)    Chronic diastolic CHF (congestive heart failure) ([place])    Weakness    Neuropathy    Severe persistent asthma 02/05/2021   Postprocedure pain management follow-up 12/08/2020   Spondylosis without myelopathy or radiculopathy, lumbosacral region 11/24/2020   Elevated C-reactive protein (CRP) 11/24/2020   Elevated sed rate 11/24/2020   Chronic pain syndrome 11/04/2020   Pharmacologic therapy 11/04/2020   Disorder of skeletal system 11/04/2020   Problems influencing health status 11/04/2020   Abnormal MRI, lumbar spine (09/21/2020) 11/04/2020   Chronic low back pain (Bilateral) w/o sciatica 11/04/2020   DDD (degenerative disc  disease), lumbosacral 11/04/2020   Lumbosacral foraminal stenosis 11/04/2020   Lumbar facet hypertrophy 11/04/2020   Levoscoliosis of lumbosacral spine 11/04/2020   Grade 1 Retrolisthesis at L2-3 and L3-4 11/04/2020   Chronic anticoagulation (Eliquis) 11/04/2020   Lumbar facet syndrome 11/04/2020   Long term prescription benzodiazepine use 11/04/2020   Chronic shoulder pain (Bilateral) 11/04/2020   Chronic elbow pain  (Bilateral) 11/04/2020   Chronic wrist pain (Bilateral) 11/04/2020   Chronic sinusitis 07/12/2020   Persistent atrial fibrillation ([place]) 07/12/2020   Anemia in chronic kidney disease 09/12/2019   Edema of lower extremity 09/12/2019   Hyposmolality and/or hyponatremia 09/12/2019   Renal cell carcinoma ([place]) 07/11/2019   AF (paroxysmal atrial fibrillation) ([place]) 05/09/2019   History of TIA (transient ischemic attack) 12/06/2018   Vision changes 12/06/2018   TIA (transient ischemic attack) 06/06/2018   Carotid artery disease ([place]) 06/06/2018   Frailty 04/24/2018   Long term current use of immunosuppressive drug 04/24/2018   Obesity (BMI 30-39.9) 04/24/2018   Fever 11/02/2017   SIADH (syndrome of inappropriate ADH production) ([place]) 04/17/2017   AKI (acute kidney injury) ([place]) 04/03/2017   Hyponatremia 01/16/2017   Urinary tract infection associated with indwelling urethral catheter ([place]) 12/19/2016   Asthma with acute exacerbation 11/14/2016   Mild intermittent asthma without complication 44/31/5400   Mood disorder ([place]) 01/18/2016   Presence of indwelling urinary catheter 08/20/2015   Advanced directives, counseling/discussion 07/28/2014   Routine general medical examination at a health care facility 04/09/2012   Venous stasis dermatitis 06/13/2011   Sleep disturbance 12/21/2010   CONSTIPATION, CHRONIC 12/07/2010   Chronic constipation 12/07/2010   Hyperlipidemia 12/02/2010   OSTEOARTHRITIS 12/02/2010   Neurogenic bladder 05/24/2010   IBS 05/05/2010   History of colonic polyps 05/05/2010   NEUROPATHY 05/03/2010   Mononeuritis 05/03/2010   Personal history of prostate cancer 03/04/2010   Benign essential hypertension 03/25/2009   GAD (generalized anxiety disorder) 01/27/2007   Coronary atherosclerosis of native coronary artery 01/27/2007   Allergic asthma 01/27/2007   GERD 01/27/2007   Chronic rheumatic arthritis ([place]) 01/27/2007   Rheumatoid arthritis involving multiple  sites with positive rheumatoid factor ([place]) 01/27/2007   PCP:  [name], MD Pharmacy:   [id number] [address] [place] [address] Phone: [phone] Fax: [phone]     Social Determinants of Health (SDOH) Interventions    Readmission Risk Interventions Readmission Risk Prevention Plan 09/12/2021 06/23/2019  Transportation Screening Complete Complete  PCP or Specialist Appt within 3-5 Days - Complete  HRI or [place] - Complete  Social Work Consult for [place] Planning/Counseling - Complete  Palliative Care Screening - Not Applicable  Medication Review Press photographer) Complete Complete  PCP or Specialist appointment within 3-5 days of discharge Complete -  [place] or Home Care Consult Complete -  SW Recovery Care/Counseling Consult Complete -  Palliative Care Screening Not Applicable -  Skilled Nursing Facility Complete -  Some recent data might be hidden

## 2021-09-13 DIAGNOSIS — I480 Paroxysmal atrial fibrillation: Secondary | ICD-10-CM | POA: Diagnosis not present

## 2021-09-13 DIAGNOSIS — D61818 Other pancytopenia: Secondary | ICD-10-CM

## 2021-09-13 DIAGNOSIS — N319 Neuromuscular dysfunction of bladder, unspecified: Secondary | ICD-10-CM | POA: Diagnosis not present

## 2021-09-13 DIAGNOSIS — R7881 Bacteremia: Secondary | ICD-10-CM | POA: Diagnosis not present

## 2021-09-13 DIAGNOSIS — N179 Acute kidney failure, unspecified: Secondary | ICD-10-CM | POA: Diagnosis not present

## 2021-09-13 DIAGNOSIS — B9620 Unspecified Escherichia coli [E. coli] as the cause of diseases classified elsewhere: Secondary | ICD-10-CM | POA: Diagnosis present

## 2021-09-13 DIAGNOSIS — A419 Sepsis, unspecified organism: Secondary | ICD-10-CM | POA: Diagnosis not present

## 2021-09-13 DIAGNOSIS — Z466 Encounter for fitting and adjustment of urinary device: Secondary | ICD-10-CM

## 2021-09-13 LAB — BASIC METABOLIC PANEL
Anion gap: 6 (ref 5–15)
BUN: 39 mg/dL — ABNORMAL HIGH (ref 8–23)
CO2: 28 mmol/L (ref 22–32)
Calcium: 7.6 mg/dL — ABNORMAL LOW (ref 8.9–10.3)
Chloride: 97 mmol/L — ABNORMAL LOW (ref 98–111)
Creatinine, Ser: 2.03 mg/dL — ABNORMAL HIGH (ref 0.61–1.24)
GFR, Estimated: 32 mL/min — ABNORMAL LOW (ref >60)
Glucose, Bld: 105 mg/dL — ABNORMAL HIGH (ref 70–99)
Potassium: 3.8 mmol/L (ref 3.5–5.1)
Sodium: 131 mmol/L — ABNORMAL LOW (ref 135–145)

## 2021-09-13 LAB — BLOOD CULTURE ID PANEL (REFLEXED) - BCID2

## 2021-09-13 LAB — URINE CULTURE: Special Requests: NORMAL

## 2021-09-13 LAB — CBC
HCT: 24.3 % — ABNORMAL LOW (ref 39.0–52.0)
Hemoglobin: 8.4 g/dL — ABNORMAL LOW (ref 13.0–17.0)
MCH: 35.3 pg — ABNORMAL HIGH (ref 26.0–34.0)
MCHC: 34.6 g/dL (ref 30.0–36.0)
MCV: 102.1 fL — ABNORMAL HIGH (ref 80.0–100.0)
Platelets: 61 10*3/uL — ABNORMAL LOW (ref 150–400)
RBC: 2.38 MIL/uL — ABNORMAL LOW (ref 4.22–5.81)
RDW: 14.8 % (ref 11.5–15.5)
WBC: 2.7 10*3/uL — ABNORMAL LOW (ref 4.0–10.5)
nRBC: 0 % (ref 0.0–0.2)

## 2021-09-13 LAB — PATHOLOGIST SMEAR REVIEW

## 2021-09-13 MED ORDER — LORATADINE 10 MG PO TABS: 10.0000 mg | ORAL_TABLET | Freq: Every day | ORAL | Status: DC

## 2021-09-13 MED ORDER — SODIUM CHLORIDE 0.9 % IV SOLN
2.0000 g | INTRAVENOUS | Status: DC
  Administered 2021-09-13 – 2021-09-17 (×5): 2 g via INTRAVENOUS
  Filled 2021-09-13: qty 2
  Filled 2021-09-13: qty 20
  Filled 2021-09-13: qty 2
  Filled 2021-09-13: qty 20
  Filled 2021-09-13: qty 2

## 2021-09-13 MED ORDER — LORATADINE 10 MG PO TABS
10.0000 mg | ORAL_TABLET | Freq: Every day | ORAL | Status: DC | PRN
  Administered 2021-09-13 – 2021-09-15 (×3): 10 mg via ORAL
  Filled 2021-09-13 (×3): qty 1

## 2021-09-13 MED ORDER — ALPRAZOLAM 0.5 MG PO TABS
1.0000 mg | ORAL_TABLET | Freq: Every day | ORAL | Status: DC
  Administered 2021-09-13 – 2021-09-19 (×7): 1 mg via ORAL
  Filled 2021-09-13 (×7): qty 2

## 2021-09-13 NOTE — Progress Notes (Addendum)
Patient ID: [name], male   DOB: 04/21/1934, 87 y.o.   MRN: [id number] [facility] PROGRESS NOTE  STORY VANVRANKEN [id number]:[id number] DOB: 11/23/1934 DOA: 09/11/2021 PCP: [name], MD  HPI/Subjective: Patient admitted initially with hypotension.  He spike fever and found to have E Coli Bacteremia.  Patient still feels fatigued.  No abdominal pain.  Objective: Vitals:   09/13/21 1005 09/13/21 1201  BP:  (!) 116/43  Pulse:  70  Resp:  16  Temp: 99.6 F (37.6 C) 98 F (36.7 C)  SpO2:  96%    Intake/Output Summary (Last 24 hours) at 09/13/2021 1426 Last data filed at 09/13/2021 0503 Gross per 24 hour  Intake 1398.21 ml  Output 450 ml  Net 948.21 ml   Filed Weights   09/11/21 1219  Weight: 95 kg    ROS: Review of Systems  Constitutional:  Positive for malaise/fatigue.  Respiratory:  Negative for shortness of breath.   Cardiovascular:  Negative for chest pain.  Gastrointestinal:  Negative for abdominal pain, nausea and vomiting.  Exam: Physical Exam HENT:     Head: Normocephalic.     Mouth/Throat:     Pharynx: No oropharyngeal exudate.  Eyes:     General: Lids are normal.     Conjunctiva/sclera: Conjunctivae normal.  Cardiovascular:     Rate and Rhythm: Normal rate and regular rhythm.     Heart sounds: Normal heart sounds, S1 normal and S2 normal.  Pulmonary:     Breath sounds: Normal breath sounds. No decreased breath sounds, wheezing, rhonchi or rales.  Abdominal:     Palpations: Abdomen is soft.     Tenderness: There is no abdominal tenderness.  Musculoskeletal:     Right lower leg: Swelling present.     Left lower leg: Swelling present.  Skin:    General: Skin is warm.     Findings: No rash.  Neurological:     Mental Status: He is alert and oriented to person, place, and time.      Scheduled Meds:  apixaban  2.5 mg Oral BID   Chlorhexidine Gluconate Cloth  6 each Topical Daily   fluticasone  2 spray Each Nare BID   folic acid  7,209 mcg  Oral Daily   gabapentin  300 mg Oral BID AC & HS   hydroxychloroquine  200 mg Oral BID   mirabegron ER  50 mg Oral Daily   pantoprazole  40 mg Oral Daily   polyethylene glycol  17 g Oral QHS   pravastatin  20 mg Oral QHS   vitamin B-12  1,000 mcg Oral Daily   Continuous Infusions:  sodium chloride 50 mL/hr at 09/13/21 0503   cefTRIAXone (ROCEPHIN)  IV 2 g (09/13/21 0956)    Assessment/Plan:  E. coli bacteremia with a fever of 103 yesterday, present on admission.  Antibiotics changed over to Rocephin with no resistance pattern on the BCID.  Follow-up sensitivities of blood cultures.  Follow-up urine culture.  Urology changed out Foley catheter today.  Holding antihypertensive medications.  Blood pressure was low on the first day. Acute kidney injury on chronic kidney disease stage II.  Creatinine 2.28 on presentation.  Creatinine 2.03 today.  Baseline creatinine around 1.14.  Gentle IV fluids today and will discontinue tomorrow. Pancytopenia.  White count down to 2.7, hemoglobin 8.4 and platelet count 61.  Technologist and pathologist reviewed the smear and no schistocytes were seen.  Continue antibiotics for bacteremia.  Continue to watch counts.  Continue to  hold methotrexate.  Continue folic acid. Paroxysmal atrial fibrillation.  Continue low-dose Eliquis with age and creatinine. Chronic diastolic congestive heart failure.  Continue to hold Lasix.  Watch closely with IV fluids. Rheumatoid arthritis.  Continue Plaquenil.  Hold methotrexate Chronic hyponatremia.  Today sodium 131 Hyperlipidemia unspecified on pravastatin Neuropathy.  On decreased dose of gabapentin with acute kidney injury Weakness.  Physical therapy recommending rehab        Code Status:     Code Status Orders  (From admission, onward)           Start     Ordered   09/11/21 1755  Full code  Continuous        09/11/21 1755           Code Status History     Date Active Date Inactive Code Status  Order ID Comments User Context   06/17/2019 1509 06/23/2019 2210 Full Code [id number]  Hollice Espy, MD Inpatient   10/28/2018 2259 10/31/2018 1654 Full Code [id number]  Lance Coon, MD Inpatient   05/25/2018 2236 05/26/2018 1653 Full Code [id number]  Lance Coon, MD Inpatient   10/18/2017 2238 10/22/2017 2015 Full Code [id number]  Salary, Avel Peace, MD Inpatient   04/03/2017 0632 04/05/2017 1808 Full Code [id number]  Harrie Foreman, MD Inpatient   12/11/2016 2139 12/15/2016 1920 Full Code [id number]  Demetrios Loll, MD Inpatient   09/07/2015 1505 09/08/2015 1744 Full Code [id number]  Dustin Flock, MD Inpatient   09/02/2015 1716 09/05/2015 1652 Full Code [id number]  Hooten, Laurice Record, MD Inpatient      Family Communication: Left message for daughter on the phone Disposition Plan: Status is: Inpatient  Consultants: Nephrology Urology for catheter exchange  Procedures: Foley catheter exchange on 09/13/2021  Antibiotics: Rocephin  Time spent: 27 minutes  [place]

## 2021-09-13 NOTE — Progress Notes (Signed)
Patient alert with intermittent confusion and very anxious. He denies pain with assess and has frequent needs. Refused gabapentin, he informs that the physician told him to discontinue use. Remains on PIV antibiotics, no adverse reactions. No fevers noted during shift. Vitals stable, will continue to monitor.

## 2021-09-13 NOTE — Consult Note (Addendum)
Urology has been consulted for Foley catheter replacement in this patient with chronic indwelling Foley catheter admitted for sepsis of possible urinary source.  Agree with antibiotic management per primary team.  Last Foley catheter exchange in clinic on 08/26/2021.  Foley catheter exchange at the bedside this morning, no complications noted, see procedure note below for further details.  Patient tolerated well.  Patient will be due for next Foley catheter exchange in 1 month, will adjust his appointments accordingly.  I discussed the plan with the patient and he is in agreement.  Cath Change/ Replacement  Patient is present today for a catheter change due to neurogenic bladder.  58ml of water was removed from the balloon, a 16FR foley cath was removed without difficulty.  Patient was cleaned and prepped in a sterile fashion with betadine and 2% lidocaine jelly was instilled into the urethra. A 16 FR foley cath was replaced into the bladder no complications were noted Urine return was not noted consistent with prior. The balloon was filled with 67ml of sterile water. A night bag was attached for drainage.  Patient tolerated well.    Performed by: Debroah Loop, PA-C

## 2021-09-13 NOTE — Progress Notes (Signed)
Physical Therapy Treatment Patient Details Name: [name] MRN: [id number] DOB: [birth year] Today's Date: 09/13/2021   History of Present Illness [name]  is a 87 y.o. male with a known history of chronic diastolic congestive heart failure, indwelling Foley catheter, rheumatoid arthritis, chronic hyponatremia, chronic kidney disease. He has been exhausted lately and having trouble sleeping, and has been weak and struggling to get up and down. In the ER he was hypotensive with a blood pressure of 94/37 on 1 occasion.  He was dehydrated with a BUN of 36 and a creatinine up to 2.28.    PT Comments    Pt seen for PT tx with pt agreeable. Pt demonstrates significant global weakness & limiting B shoulder ROM that impacts independence with mobility. Pt requires max assist for supine<>sit but can demonstrate fair sitting balance. Pt is able to transfer sit>stand from EOB x 2 with max assist. Pt encouraged to take steps and is able to slide RLE but unable to weight shift to R to advance/move LLE. Pt would benefit from +2 assist for any gait attempts to increase safety. Will continue to follow pt acutely & progress gait as able.    Recommendations for follow up therapy are one component of a multi-disciplinary discharge planning process, led by the attending physician.  Recommendations may be updated based on patient status, additional functional criteria and insurance authorization.  Follow Up Recommendations  SNF     Equipment Recommendations  None recommended by PT    Recommendations for Other Services       Precautions / Restrictions Precautions Precautions: Fall Restrictions Weight Bearing Restrictions: No     Mobility  Bed Mobility Overal bed mobility: Needs Assistance Bed Mobility: Supine to Sit;Sit to Supine     Supine to sit: Max assist;HOB elevated Sit to supine: Max assist;HOB elevated   General bed mobility comments: Pt with limited ability to move BLE to EOB, unable  to use bed rails 2/2 limited B shoulder ROM.    Transfers Overall transfer level: Needs assistance Equipment used: Rolling walker (2 wheeled) Transfers: Sit to/from Stand Sit to Stand: Max assist         General transfer comment: Max assist sit<>stand from EOB x 2 with EOB significantly elevated, pt elects to pull on RW with BUE vs pushing to stand despite cuing/education on proper/safe hand placement  Ambulation/Gait                 Stairs             Wheelchair Mobility    Modified Rankin (Stroke Patients Only)       Balance Overall balance assessment: Needs assistance Sitting-balance support: Feet supported;Bilateral upper extremity supported Sitting balance-[name]: Fair Sitting balance - Comments: close supervision static sitting   Standing balance support: Bilateral upper extremity supported;During functional activity Standing balance-[name]: Poor Standing balance comment: Posterior lean, assistance to move RW forward & manual facilitation to weight shift forward, then pt with with trunk flexion & forward lean onto RW, maintains BLE knee & hip flexion and unable to shift pelvis anteriorly for full upright posture.                            Cognition Arousal/Alertness: Awake/alert Behavior During Therapy: WFL for tasks assessed/performed Overall Cognitive Status: No family/caregiver present to determine baseline cognitive functioning  General Comments: [place], demonstrates memory deficits as pt forgets what he's talking about mid conversation, tangential conversation      Exercises      General Comments        Pertinent Vitals/Pain Pain Assessment: Faces Faces Pain Scale: Hurts a little bit Pain Location: BLE feet when PT dons ted hose Pain Descriptors / Indicators: Grimacing;Discomfort Pain Intervention(s): Monitored during session;Repositioned    Home Living                       Prior Function            PT Goals (current goals can now be found in the care plan section) Acute Rehab PT Goals Patient Stated Goal: to return home with family PT Goal Formulation: With patient Time For Goal Achievement: 09/26/21 Potential to Achieve Goals: Fair Progress towards PT goals: Progressing toward goals    Frequency    Min 2X/week      PT Plan Current plan remains appropriate    Co-evaluation              AM-PAC PT "6 Clicks" Mobility   Outcome Measure  Help needed turning from your back to your side while in a flat bed without using bedrails?: A Lot Help needed moving from lying on your back to sitting on the side of a flat bed without using bedrails?: Total Help needed moving to and from a bed to a chair (including a wheelchair)?: Total Help needed standing up from a chair using your arms (e.g., wheelchair or bedside chair)?: Total Help needed to walk in hospital room?: Total Help needed climbing 3-5 steps with a railing? : Total 6 Click Score: 7    End of Session Equipment Utilized During Treatment: Gait belt Activity Tolerance: Patient tolerated treatment well Patient left: in bed;with call bell/phone within reach;with bed alarm set Nurse Communication: Mobility status PT Visit Diagnosis: Unsteadiness on feet (R26.81);Other abnormalities of gait and mobility (R26.89);Muscle weakness (generalized) (M62.81)     Time: 2130-8657 PT Time Calculation (min) (ACUTE ONLY): 24 min  Charges:  $Therapeutic Activity: 23-37 mins                     Lavone Nian, PT, DPT 09/13/21, 12:43 PM    Waunita Schooner 09/13/2021, 12:41 PM

## 2021-09-13 NOTE — Progress Notes (Signed)
PHARMACY - PHYSICIAN COMMUNICATION CRITICAL VALUE ALERT - BLOOD CULTURE IDENTIFICATION (BCID)  [name] is an 87 y.o. male who presented to [facility] on 09/11/2021 with a chief complaint of AKI, sepsis.   Assessment:  E Coli in 1 of 4 bottles, no resistance (include suspected source if known)  Name of physician (or Provider) Contacted: [place]   Current antibiotics: zosyn 3.375 gm IV Q8H   Changes to prescribed antibiotics recommended:  Will d/c zosyn and start ceftriaxone 2 gm IV Q24H   Results for orders placed or performed during the hospital encounter of 09/11/21  Blood Culture ID Panel (Reflexed) (Collected: 09/12/2021 11:19 AM)  Result Value Ref Range   Enterococcus faecalis NOT DETECTED NOT DETECTED   Enterococcus Faecium NOT DETECTED NOT DETECTED   Listeria monocytogenes NOT DETECTED NOT DETECTED   Staphylococcus species NOT DETECTED NOT DETECTED   Staphylococcus aureus (BCID) NOT DETECTED NOT DETECTED   Staphylococcus epidermidis NOT DETECTED NOT DETECTED   Staphylococcus lugdunensis NOT DETECTED NOT DETECTED   Streptococcus species NOT DETECTED NOT DETECTED   Streptococcus agalactiae NOT DETECTED NOT DETECTED   Streptococcus pneumoniae NOT DETECTED NOT DETECTED   Streptococcus pyogenes NOT DETECTED NOT DETECTED   A.calcoaceticus-baumannii NOT DETECTED NOT DETECTED   Bacteroides fragilis NOT DETECTED NOT DETECTED   Enterobacterales DETECTED (A) NOT DETECTED   Enterobacter cloacae complex NOT DETECTED NOT DETECTED   Escherichia coli DETECTED (A) NOT DETECTED   Klebsiella aerogenes NOT DETECTED NOT DETECTED   Klebsiella oxytoca NOT DETECTED NOT DETECTED   Klebsiella pneumoniae NOT DETECTED NOT DETECTED   Proteus species NOT DETECTED NOT DETECTED   Salmonella species NOT DETECTED NOT DETECTED   Serratia marcescens NOT DETECTED NOT DETECTED   Haemophilus influenzae NOT DETECTED NOT DETECTED   Neisseria meningitidis NOT DETECTED NOT DETECTED   Pseudomonas aeruginosa  NOT DETECTED NOT DETECTED   Stenotrophomonas maltophilia NOT DETECTED NOT DETECTED   Candida albicans NOT DETECTED NOT DETECTED   Candida auris NOT DETECTED NOT DETECTED   Candida glabrata NOT DETECTED NOT DETECTED   Candida krusei NOT DETECTED NOT DETECTED   Candida parapsilosis NOT DETECTED NOT DETECTED   Candida tropicalis NOT DETECTED NOT DETECTED   Cryptococcus neoformans/gattii NOT DETECTED NOT DETECTED   CTX-M ESBL NOT DETECTED NOT DETECTED   Carbapenem resistance IMP NOT DETECTED NOT DETECTED   Carbapenem resistance KPC NOT DETECTED NOT DETECTED   Carbapenem resistance NDM NOT DETECTED NOT DETECTED   Carbapenem resist OXA 48 LIKE NOT DETECTED NOT DETECTED   Carbapenem resistance VIM NOT DETECTED NOT DETECTED    Keyonni Percival D 09/13/2021  6:25 AM

## 2021-09-13 NOTE — TOC Progression Note (Addendum)
Transition of Care [facility]) - Progression Note    Patient Details  Name: LEVERT HESLOP MRN: [id number] Date of Birth: [birth year]  Transition of Care [facility]) CM/SW [place], RN Phone Number: 09/13/2021, 10:31 AM  Clinical Narrative:   Spoke with the patient and reviewed the bed offers, he chose [facility].  TOC notified Admissions at [facility] and accepted in the Hub, Insurance auth process started ref number [id number]    Expected Discharge Plan: [place] Barriers to Discharge: Continued Medical Work up  Expected Discharge Plan and Services Expected Discharge Plan: [place] arrangements for the past 2 months: Single Family Home                                       Social Determinants of Health (SDOH) Interventions    Readmission Risk Interventions Readmission Risk Prevention Plan 09/12/2021 06/23/2019  Transportation Screening Complete Complete  PCP or Specialist Appt within 3-5 Days - Complete  HRI or [place] - Complete  Social Work Consult for [place] Planning/Counseling - Complete  Palliative Care Screening - Not Applicable  Medication Review Press photographer) Complete Complete  PCP or Specialist appointment within 3-5 days of discharge Complete -  [place] or Home Care Consult Complete -  SW Recovery Care/Counseling Consult Complete -  Palliative Care Screening Not Applicable -  Skilled Nursing Facility Complete -  Some recent data might be hidden

## 2021-09-13 NOTE — Progress Notes (Signed)
[facility]  ROUNDING NOTE   Subjective:   [name] is an 87 year old male with a past medical history of chronic diastolic heart failure, rheumatoid arthritis, chronic hyponatremia, chronic indwelling Foley, and chronic kidney disease.  Patient presents to the emergency room with complaints of progressive fatigue.  He has been admitted for AKI (acute kidney injury) ([place]) [N17.9] Acute kidney injury ([place]) [N17.9] Hypotension, unspecified hypotension type [I95.9]  Patient is followed by our clinic and receives care from Dr. Holley Raring.   Patient resting in bed Reports nausea with dinner on previous night, but denies while currently eating breakfast Concerned of increased swelling of legs Denies shortness of breath   Objective:  Vital signs in last 24 hours:  Temp:  [98 F (36.7 C)-101.1 F (38.4 C)] 98 F (36.7 C) (10/17 1201) Pulse Rate:  [70-88] 70 (10/17 1201) Resp:  [16-18] 16 (10/17 1201) BP: (116-132)/(42-54) 116/43 (10/17 1201) SpO2:  [92 %-96 %] 96 % (10/17 1201)  Weight change:  Filed Weights   09/11/21 1219  Weight: 95 kg    Intake/Output: I/O last 3 completed shifts: In: 1398.2 [I.V.:1298.2; IV Piggyback:100] Out: 800 [Urine:800]   Intake/Output this shift:  No intake/output data recorded.  Physical Exam: General: NAD, resting in bed  Head: Normocephalic, atraumatic. Moist oral mucosal membranes  Eyes: Anicteric  Lungs:  Clear to auscultation, normal breathing effort  Heart: Regular rate and rhythm  Abdomen:  Soft, nontender,   Extremities:  1+ peripheral edema.  Neurologic: Nonfocal, moving all four extremities  Skin: No lesions  GU Chronic Foley     Basic Metabolic Panel: Recent Labs  Lab 09/11/21 1405 09/12/21 0524 09/13/21 0457  NA 135 134* 131*  K 4.4 4.1 3.8  CL 96* 95* 97*  CO2 27 30 28   GLUCOSE 106* 100* 105*  BUN 36* 37* 39*  CREATININE 2.28* 2.11* 2.03*  CALCIUM 8.7* 8.2* 7.6*     Liver Function Tests: Recent  Labs  Lab 09/11/21 1405  AST 46*  ALT 32  ALKPHOS 48  BILITOT 1.0  PROT 6.6  ALBUMIN 3.6    No results for input(s): LIPASE, AMYLASE in the last 168 hours. No results for input(s): AMMONIA in the last 168 hours.  CBC: Recent Labs  Lab 09/11/21 1405 09/12/21 0524 09/13/21 0457  WBC 9.5 5.1 2.7*  NEUTROABS 7.7 4.6  --   HGB 11.1* 9.1* 8.4*  HCT 33.3* 26.9* 24.3*  MCV 103.1* 105.9* 102.1*  PLT 82* 69* 61*     Cardiac Enzymes: No results for input(s): CKTOTAL, CKMB, CKMBINDEX, TROPONINI in the last 168 hours.  BNP: Invalid input(s): POCBNP  CBG: No results for input(s): GLUCAP in the last 168 hours.  Microbiology: Results for orders placed or performed during the hospital encounter of 09/11/21  Resp Panel by RT-PCR (Flu A&B, Covid) Urine, Catheterized     Status: None   Collection Time: 09/11/21  3:41 PM   Specimen: Urine, Catheterized; Nasopharyngeal(NP) swabs in vial transport medium  Result Value Ref Range Status   SARS Coronavirus 2 by RT PCR NEGATIVE NEGATIVE Final    Comment: (NOTE) SARS-CoV-2 target nucleic acids are NOT DETECTED.  The SARS-CoV-2 RNA is generally detectable in upper respiratory specimens during the acute phase of infection. The lowest concentration of SARS-CoV-2 viral copies this assay can detect is 138 copies/mL. A negative result does not preclude SARS-Cov-2 infection and should not be used as the sole basis for treatment or other patient management decisions. A negative result may  occur with  improper specimen collection/handling, submission of specimen other than nasopharyngeal swab, presence of viral mutation(s) within the areas targeted by this assay, and inadequate number of viral copies(<138 copies/mL). A negative result must be combined with clinical observations, patient history, and epidemiological information. The expected result is Negative.  Fact Sheet for Patients:  EntrepreneurPulse.com.au  Fact Sheet  for Healthcare Providers:  IncredibleEmployment.be  This test is no t yet approved or cleared by the [place] FDA and  has been authorized for detection and/or diagnosis of SARS-CoV-2 by FDA under an Emergency Use Authorization (EUA). This EUA will remain  in effect (meaning this test can be used) for the duration of the COVID-19 declaration under Section 564(b)(1) of the Act, 21 U.S.C.section 360bbb-3(b)(1), unless the authorization is terminated  or revoked sooner.       Influenza A by PCR NEGATIVE NEGATIVE Final   Influenza B by PCR NEGATIVE NEGATIVE Final    Comment: (NOTE) The Xpert Xpress SARS-CoV-2/FLU/RSV plus assay is intended as an aid in the diagnosis of influenza from Nasopharyngeal swab specimens and should not be used as a sole basis for treatment. Nasal washings and aspirates are unacceptable for Xpert Xpress SARS-CoV-2/FLU/RSV testing.  Fact Sheet for Patients: EntrepreneurPulse.com.au  Fact Sheet for Healthcare Providers: IncredibleEmployment.be  This test is not yet approved or cleared by the [place] FDA and has been authorized for detection and/or diagnosis of SARS-CoV-2 by FDA under an Emergency Use Authorization (EUA). This EUA will remain in effect (meaning this test can be used) for the duration of the COVID-19 declaration under Section 564(b)(1) of the Act, 21 U.S.C. section 360bbb-3(b)(1), unless the authorization is terminated or revoked.  Performed at [facility], [place], [place] [address]   CULTURE, BLOOD (ROUTINE X 2) w Reflex to ID Panel     Status: None (Preliminary result)   Collection Time: 09/12/21 11:19 AM   Specimen: BLOOD  Result Value Ref Range Status   Specimen Description BLOOD LEFT HABD  Final   Special Requests   Final    BOTTLES DRAWN AEROBIC AND ANAEROBIC Blood Culture adequate volume   Culture  Setup Time   Final    Organism ID to  follow GRAM NEGATIVE RODS ANAEROBIC BOTTLE ONLY CRITICAL RESULT CALLED TO, READ BACK BY AND VERIFIED WITH: JASON ROBBINS AT 0500 09/13/21.PMF Performed at [facility], [address] [place], [place] [address]    Culture GRAM NEGATIVE RODS  Final   Report Status PENDING  Incomplete  CULTURE, BLOOD (ROUTINE X 2) w Reflex to ID Panel     Status: None (Preliminary result)   Collection Time: 09/12/21 11:19 AM   Specimen: BLOOD  Result Value Ref Range Status   Specimen Description BLOOD RIGHT HAND  Final   Special Requests   Final    BOTTLES DRAWN AEROBIC AND ANAEROBIC Blood Culture adequate volume   Culture   Final    NO GROWTH < 24 HOURS Performed at [facility], [address] [place], [place] [address]    Report Status PENDING  Incomplete  Blood Culture ID Panel (Reflexed)     Status: Abnormal   Collection Time: 09/12/21 11:19 AM  Result Value Ref Range Status   Enterococcus faecalis NOT DETECTED NOT DETECTED Final   Enterococcus Faecium NOT DETECTED NOT DETECTED Final   Listeria monocytogenes NOT DETECTED NOT DETECTED Final   Staphylococcus species NOT DETECTED NOT DETECTED Final   Staphylococcus aureus (BCID) NOT DETECTED NOT DETECTED  Final   Staphylococcus epidermidis NOT DETECTED NOT DETECTED Final   Staphylococcus lugdunensis NOT DETECTED NOT DETECTED Final   Streptococcus species NOT DETECTED NOT DETECTED Final   Streptococcus agalactiae NOT DETECTED NOT DETECTED Final   Streptococcus pneumoniae NOT DETECTED NOT DETECTED Final   Streptococcus pyogenes NOT DETECTED NOT DETECTED Final   A.calcoaceticus-baumannii NOT DETECTED NOT DETECTED Final   Bacteroides fragilis NOT DETECTED NOT DETECTED Final   Enterobacterales DETECTED (A) NOT DETECTED Final    Comment: Enterobacterales represent a large order of gram negative bacteria, not a single organism. CRITICAL RESULT CALLED TO, READ BACK BY AND VERIFIED WITH: JASON ROBBINS AT 0500 09/13/21.PMF     Enterobacter cloacae complex NOT DETECTED NOT DETECTED Final   Escherichia coli DETECTED (A) NOT DETECTED Final    Comment: CRITICAL RESULT CALLED TO, READ BACK BY AND VERIFIED WITH: JASON ROBBINS AT 0500 09/13/21.PMF    Klebsiella aerogenes NOT DETECTED NOT DETECTED Final   Klebsiella oxytoca NOT DETECTED NOT DETECTED Final   Klebsiella pneumoniae NOT DETECTED NOT DETECTED Final   Proteus species NOT DETECTED NOT DETECTED Final   Salmonella species NOT DETECTED NOT DETECTED Final   Serratia marcescens NOT DETECTED NOT DETECTED Final   Haemophilus influenzae NOT DETECTED NOT DETECTED Final   Neisseria meningitidis NOT DETECTED NOT DETECTED Final   Pseudomonas aeruginosa NOT DETECTED NOT DETECTED Final   Stenotrophomonas maltophilia NOT DETECTED NOT DETECTED Final   Candida albicans NOT DETECTED NOT DETECTED Final   Candida auris NOT DETECTED NOT DETECTED Final   Candida glabrata NOT DETECTED NOT DETECTED Final   Candida krusei NOT DETECTED NOT DETECTED Final   Candida parapsilosis NOT DETECTED NOT DETECTED Final   Candida tropicalis NOT DETECTED NOT DETECTED Final   Cryptococcus neoformans/gattii NOT DETECTED NOT DETECTED Final   CTX-M ESBL NOT DETECTED NOT DETECTED Final   Carbapenem resistance IMP NOT DETECTED NOT DETECTED Final   Carbapenem resistance KPC NOT DETECTED NOT DETECTED Final   Carbapenem resistance NDM NOT DETECTED NOT DETECTED Final   Carbapenem resist OXA 48 LIKE NOT DETECTED NOT DETECTED Final   Carbapenem resistance VIM NOT DETECTED NOT DETECTED Final    Comment: Performed at [facility], [address] [place], [place] [address]   *Note: Due to a large number of results and/or encounters for the requested time period, some results have not been displayed. A complete set of results can be found in Results Review.    Coagulation Studies: Recent Labs    09/12/21 0524  LABPROT 21.6*  INR 1.9*     Urinalysis: Recent Labs    09/11/21 1220  09/12/21 1119  COLORURINE YELLOW* AMBER*  LABSPEC 1.019 1.020  PHURINE 5.0 5.0  GLUCOSEU NEGATIVE NEGATIVE  HGBUR MODERATE* MODERATE*  BILIRUBINUR NEGATIVE NEGATIVE  KETONESUR 5* NEGATIVE  PROTEINUR 100* 100*  NITRITE NEGATIVE NEGATIVE  LEUKOCYTESUR NEGATIVE TRACE*       Imaging: DG Chest Portable 1 View  Result Date: 09/11/2021 CLINICAL DATA:  Weakness, evaluate for edema EXAM: PORTABLE CHEST 1 VIEW COMPARISON:  Chest radiograph 01/18/2021 FINDINGS: The heart is at the upper limits of normal for size. The mediastinal contours are stable, with unchanged calcified atherosclerotic plaque of the aortic arch. There is vascular congestion and mild pulmonary interstitial edema. There is no focal consolidation. There is no pleural effusion. There is no pneumothorax. An accessory azygous fissure is again noted. There is marked degenerative change of the shoulders. IMPRESSION: Mild pulmonary interstitial edema. Electronically Signed   By: Valetta Mole  M.D.   On: 09/11/2021 16:06     Medications:    sodium chloride 50 mL/hr at 09/13/21 0503   cefTRIAXone (ROCEPHIN)  IV 2 g (09/13/21 0956)    apixaban  2.5 mg Oral BID   Chlorhexidine Gluconate Cloth  6 each Topical Daily   fluticasone  2 spray Each Nare BID   folic acid  7,026 mcg Oral Daily   gabapentin  300 mg Oral BID AC & HS   hydroxychloroquine  200 mg Oral BID   mirabegron ER  50 mg Oral Daily   pantoprazole  40 mg Oral Daily   polyethylene glycol  17 g Oral QHS   pravastatin  20 mg Oral QHS   vitamin B-12  1,000 mcg Oral Daily   acetaminophen **OR** acetaminophen, ALPRAZolam, chlorhexidine, ondansetron **OR** ondansetron (ZOFRAN) IV  Assessment/ Plan:  Mr. BLAKELY GLUTH is a 87 y.o.  male with a past medical history of chronic diastolic heart failure, rheumatoid arthritis, chronic hyponatremia, chronic indwelling Foley, and chronic kidney disease.  Patient presents to the emergency room with complaints of progressive fatigue.   He has been admitted for AKI (acute kidney injury) ([place]) [N17.9] Acute kidney injury ([place]) [N17.9] Hypotension, unspecified hypotension type [I95.9]   Acute Kidney Injury on chronic kidney disease stage 3a with baseline creatinine 1.2 and GFR of 58 on 08/10/21.  Acute kidney injury secondary to believed overdiuresis with poor appetite Chronic kidney disease is secondary to right nephrectomy No IV contrast exposure.  No indication for dialysis at this time.  Avoid nephrotoxic therapies. Creatinine improving. Discussed with patient that diuretics would hinder renal recovery. Continue to hold diuretic.  Continue IV fluids for hydration for 1 days.  Will order ace wraps for  legs and elevation. Will monitor closely for signs of fluid overload.  Lab Results  Component Value Date   CREATININE 2.03 (H) 09/13/2021   CREATININE 2.11 (H) 09/12/2021   CREATININE 2.28 (H) 09/11/2021    Intake/Output Summary (Last 24 hours) at 09/13/2021 1350 Last data filed at 09/13/2021 0503 Gross per 24 hour  Intake 1398.21 ml  Output 450 ml  Net 948.21 ml    2.  Chronic diastolic heart failure: Echo from June 2021 shows EF of 60 to 65%.  Currently followed by cardiology outpatient     LOS: 2   10/17/20221:50 PM

## 2021-09-14 ENCOUNTER — Ambulatory Visit: Payer: Self-pay

## 2021-09-14 DIAGNOSIS — D61818 Other pancytopenia: Secondary | ICD-10-CM | POA: Diagnosis not present

## 2021-09-14 DIAGNOSIS — I480 Paroxysmal atrial fibrillation: Secondary | ICD-10-CM | POA: Diagnosis not present

## 2021-09-14 DIAGNOSIS — N179 Acute kidney failure, unspecified: Secondary | ICD-10-CM | POA: Diagnosis not present

## 2021-09-14 DIAGNOSIS — R7881 Bacteremia: Secondary | ICD-10-CM | POA: Diagnosis not present

## 2021-09-14 LAB — BASIC METABOLIC PANEL
Anion gap: 10 (ref 5–15)
BUN: 37 mg/dL — ABNORMAL HIGH (ref 8–23)
CO2: 26 mmol/L (ref 22–32)
Calcium: 7.7 mg/dL — ABNORMAL LOW (ref 8.9–10.3)
Chloride: 98 mmol/L (ref 98–111)
Creatinine, Ser: 1.66 mg/dL — ABNORMAL HIGH (ref 0.61–1.24)
GFR, Estimated: 40 mL/min — ABNORMAL LOW (ref >60)
Glucose, Bld: 102 mg/dL — ABNORMAL HIGH (ref 70–99)
Potassium: 3.7 mmol/L (ref 3.5–5.1)
Sodium: 134 mmol/L — ABNORMAL LOW (ref 135–145)

## 2021-09-14 LAB — CBC
HCT: 24.8 % — ABNORMAL LOW (ref 39.0–52.0)
Hemoglobin: 8.3 g/dL — ABNORMAL LOW (ref 13.0–17.0)
MCH: 34.4 pg — ABNORMAL HIGH (ref 26.0–34.0)
MCHC: 33.5 g/dL (ref 30.0–36.0)
MCV: 102.9 fL — ABNORMAL HIGH (ref 80.0–100.0)
Platelets: 52 10*3/uL — ABNORMAL LOW (ref 150–400)
RBC: 2.41 MIL/uL — ABNORMAL LOW (ref 4.22–5.81)
RDW: 14.9 % (ref 11.5–15.5)
WBC: 3.3 10*3/uL — ABNORMAL LOW (ref 4.0–10.5)
nRBC: 0 % (ref 0.0–0.2)

## 2021-09-14 MED ORDER — METOPROLOL SUCCINATE ER 25 MG PO TB24
12.5000 mg | ORAL_TABLET | Freq: Every day | ORAL | Status: DC
  Administered 2021-09-14 – 2021-09-27 (×14): 12.5 mg via ORAL
  Filled 2021-09-14 (×14): qty 1

## 2021-09-14 MED ORDER — BOOST / RESOURCE BREEZE PO LIQD CUSTOM
1.0000 | Freq: Three times a day (TID) | ORAL | Status: DC
  Administered 2021-09-14 – 2021-09-28 (×21): 1 via ORAL

## 2021-09-14 NOTE — Progress Notes (Signed)
Patient ID: [name], male   DOB: 1934/06/11, 87 y.o.   MRN: [id number] [facility] PROGRESS NOTE  JAVAN GONZAGA WNU:[id number] DOB: 1934/07/19 DOA: 09/11/2021 PCP: Viviana Simpler I, MD  HPI/Subjective: Patient this morning was cold and needed to be under the blanket.  Had a temperature of 101 a little after midnight.  Admitted initially with hypotension and then the next day spiked fever of 103 and then found to have a positive blood culture of E. coli.  Objective: Vitals:   09/14/21 0816 09/14/21 1232  BP: (!) 150/51 (!) 126/50  Pulse: 84 78  Resp: 18 16  Temp: 98.8 F (37.1 C) 99.1 F (37.3 C)  SpO2: 94% 94%    Intake/Output Summary (Last 24 hours) at 09/14/2021 1436 Last data filed at 09/14/2021 0548 Gross per 24 hour  Intake --  Output 500 ml  Net -500 ml   Filed Weights   09/11/21 1219  Weight: 95 kg    ROS: Review of Systems  Respiratory:  Negative for shortness of breath.   Cardiovascular:  Negative for chest pain.  Gastrointestinal:  Negative for abdominal pain, nausea and vomiting.  Exam: Physical Exam HENT:     Head: Normocephalic.     Mouth/Throat:     Pharynx: No oropharyngeal exudate.  Eyes:     General: Lids are normal.     Conjunctiva/sclera: Conjunctivae normal.  Cardiovascular:     Rate and Rhythm: Normal rate and regular rhythm.     Heart sounds: Normal heart sounds, S1 normal and S2 normal.  Pulmonary:     Breath sounds: No decreased breath sounds, wheezing, rhonchi or rales.  Abdominal:     Palpations: Abdomen is soft.     Tenderness: There is no abdominal tenderness.  Musculoskeletal:     Right lower leg: Swelling present.     Left lower leg: Swelling present.  Skin:    General: Skin is warm.     Findings: No rash.  Neurological:     Mental Status: He is alert and oriented to person, place, and time.      Scheduled Meds:  ALPRAZolam  1 mg Oral QHS   apixaban  2.5 mg Oral BID   Chlorhexidine Gluconate Cloth  6 each  Topical Daily   feeding supplement  1 Container Oral TID BM   fluticasone  2 spray Each Nare BID   folic acid  0,347 mcg Oral Daily   gabapentin  300 mg Oral BID AC & HS   hydroxychloroquine  200 mg Oral BID   mirabegron ER  50 mg Oral Daily   pantoprazole  40 mg Oral Daily   polyethylene glycol  17 g Oral QHS   pravastatin  20 mg Oral QHS   vitamin B-12  1,000 mcg Oral Daily   Continuous Infusions:  cefTRIAXone (ROCEPHIN)  IV 2 g (09/14/21 1007)   Brief history: 87 year old man with chronic indwelling Foley catheter, chronic diastolic congestive heart failure, chronic hyponatremia, essential hypertension, 1 kidney secondary to renal mass, persistent atrial fibrillation.  Patient presented with hypotension initially.  Initial urine analysis showed of blood but nitrites and leukocyte esterase was negative.  I held off on antibiotics initially but the next day the patient spiked a fever of 103 and blood cultures were drawn and E. coli growing out of 1 out of 4 bottles.  Urology changed out his Foley catheter.  Patient is on Rocephin.  Assessment/Plan:  E. coli bacteremia with a fever of 103 on  09/12/2021.  Had a temperature after midnight of 101.  Continue Rocephin.  Follow-up blood culture sensitivities.  BCID did not show any resistant profile.  Urine culture had multiple organisms.  Foley catheter changed by urology. Acute kidney injury on chronic kidney disease stage II.  Creatinine 2.28 on presentation.  Baseline creatinine around 1.14.  Creatinine 1.66 today.  Discontinue IV fluids.  Dr. Holley Raring nephrology following. Thrombocytopenia.  Platelet count lower today at 52.  Pathologist and technologist reviewed smear and showed giant platelets and no schistocytes.  Continue to monitor platelet count. Pancytopenia.  White blood cell count 3.3.  Hemoglobin 8.3.  Platelet count 52.  Holding methotrexate at this time. Paroxysmal atrial fibrillation on low-dose Eliquis with age and creatinine.  Try  to restart low-dose Toprol tonight. Chronic diastolic congestive heart failure.  Continue to hold Lasix.  Discontinue IV fluids today.  Try to restart low-dose Toprol tonight Rheumatoid arthritis on Plaquenil Chronic hyponatremia.  Today's sodium 134 Hyperlipidemia unspecified on pravastatin Neuropathy on decreased dose of gabapentin with acute kidney injury Weakness.  Physical therapy recommending rehab and has a bed once medically stable        Code Status:     Code Status Orders  (From admission, onward)           Start     Ordered   09/11/21 1755  Full code  Continuous        09/11/21 1755           Code Status History     Date Active Date Inactive Code Status Order ID Comments User Context   06/17/2019 1509 06/23/2019 2210 Full Code [id number]  Hollice Espy, MD Inpatient   10/28/2018 2259 10/31/2018 1654 Full Code [id number]  Lance Coon, MD Inpatient   05/25/2018 2236 05/26/2018 1653 Full Code [id number]  Lance Coon, MD Inpatient   10/18/2017 2238 10/22/2017 2015 Full Code [id number]  Salary, Avel Peace, MD Inpatient   04/03/2017 0632 04/05/2017 1808 Full Code [id number]  Harrie Foreman, MD Inpatient   12/11/2016 2139 12/15/2016 1920 Full Code [id number]  Demetrios Loll, MD Inpatient   09/07/2015 1505 09/08/2015 1744 Full Code [id number]  Dustin Flock, MD Inpatient   09/02/2015 1716 09/05/2015 1652 Full Code [id number]  Hooten, Laurice Record, MD Inpatient      Family Communication: Updated daughter on the phone Disposition Plan: Status is: Inpatient.  Likely will need a few days of IV antibiotics here in the hospital before going out to rehab  Consultants: Nephrology  Procedures: Urology changed out Foley catheter  Antibiotics: Rocephin  Time spent: 28 minutes  [place]

## 2021-09-14 NOTE — Progress Notes (Signed)
[facility]  ROUNDING NOTE   Subjective:   [name] is an 87 year old male with a past medical history of chronic diastolic heart failure, rheumatoid arthritis, chronic hyponatremia, chronic indwelling Foley, and chronic kidney disease.  Patient presents to the emergency room with complaints of progressive fatigue.  He has been admitted for AKI (acute kidney injury) ([place]) [N17.9] Acute kidney injury ([place]) [N17.9] Hypotension, unspecified hypotension type [I95.9]  Patient is followed by our clinic and receives care from Dr. Holley Raring.   Patient is appears flushed, complains of leg pain from leg wraps. Nursing recently removed wraps, improved edema Primary MD DC'd IVF   Objective:  Vital signs in last 24 hours:  Temp:  [98.1 F (36.7 C)-101.1 F (38.4 C)] 99.1 F (37.3 C) (10/18 1232) Pulse Rate:  [75-85] 78 (10/18 1232) Resp:  [16-18] 16 (10/18 1232) BP: (125-150)/(45-63) 126/50 (10/18 1232) SpO2:  [92 %-95 %] 94 % (10/18 1232)  Weight change:  Filed Weights   09/11/21 1219  Weight: 95 kg    Intake/Output: I/O last 3 completed shifts: In: 1398.2 [I.V.:1298.2; IV Piggyback:100] Out: 750 [Urine:750]   Intake/Output this shift:  No intake/output data recorded.  Physical Exam: General: NAD, resting in bed  Head: Normocephalic, atraumatic. Moist oral mucosal membranes  Eyes: Anicteric  Lungs:  Clear to auscultation, normal breathing effort  Heart: Regular rate and rhythm  Abdomen:  Soft, nontender  Extremities:  1+ peripheral edema.  Neurologic: Nonfocal, moving all four extremities  Skin: No lesions  GU Chronic Foley     Basic Metabolic Panel: Recent Labs  Lab 09/11/21 1405 09/12/21 0524 09/13/21 0457 09/14/21 0554  NA 135 134* 131* 134*  K 4.4 4.1 3.8 3.7  CL 96* 95* 97* 98  CO2 27 30 28 26   GLUCOSE 106* 100* 105* 102*  BUN 36* 37* 39* 37*  CREATININE 2.28* 2.11* 2.03* 1.66*  CALCIUM 8.7* 8.2* 7.6* 7.7*     Liver Function  Tests: Recent Labs  Lab 09/11/21 1405  AST 46*  ALT 32  ALKPHOS 48  BILITOT 1.0  PROT 6.6  ALBUMIN 3.6    No results for input(s): LIPASE, AMYLASE in the last 168 hours. No results for input(s): AMMONIA in the last 168 hours.  CBC: Recent Labs  Lab 09/11/21 1405 09/12/21 0524 09/13/21 0457 09/14/21 0554  WBC 9.5 5.1 2.7* 3.3*  NEUTROABS 7.7 4.6  --   --   HGB 11.1* 9.1* 8.4* 8.3*  HCT 33.3* 26.9* 24.3* 24.8*  MCV 103.1* 105.9* 102.1* 102.9*  PLT 82* 69* 61* 52*     Cardiac Enzymes: No results for input(s): CKTOTAL, CKMB, CKMBINDEX, TROPONINI in the last 168 hours.  BNP: Invalid input(s): POCBNP  CBG: No results for input(s): GLUCAP in the last 168 hours.  Microbiology: Results for orders placed or performed during the hospital encounter of 09/11/21  Resp Panel by RT-PCR (Flu A&B, Covid) Urine, Catheterized     Status: None   Collection Time: 09/11/21  3:41 PM   Specimen: Urine, Catheterized; Nasopharyngeal(NP) swabs in vial transport medium  Result Value Ref Range Status   SARS Coronavirus 2 by RT PCR NEGATIVE NEGATIVE Final    Comment: (NOTE) SARS-CoV-2 target nucleic acids are NOT DETECTED.  The SARS-CoV-2 RNA is generally detectable in upper respiratory specimens during the acute phase of infection. The lowest concentration of SARS-CoV-2 viral copies this assay can detect is 138 copies/mL. A negative result does not preclude SARS-Cov-2 infection and should not be used as the  sole basis for treatment or other patient management decisions. A negative result may occur with  improper specimen collection/handling, submission of specimen other than nasopharyngeal swab, presence of viral mutation(s) within the areas targeted by this assay, and inadequate number of viral copies(<138 copies/mL). A negative result must be combined with clinical observations, patient history, and epidemiological information. The expected result is Negative.  Fact Sheet for  Patients:  EntrepreneurPulse.com.au  Fact Sheet for Healthcare Providers:  IncredibleEmployment.be  This test is no t yet approved or cleared by the [place] FDA and  has been authorized for detection and/or diagnosis of SARS-CoV-2 by FDA under an Emergency Use Authorization (EUA). This EUA will remain  in effect (meaning this test can be used) for the duration of the COVID-19 declaration under Section 564(b)(1) of the Act, 21 U.S.C.section 360bbb-3(b)(1), unless the authorization is terminated  or revoked sooner.       Influenza A by PCR NEGATIVE NEGATIVE Final   Influenza B by PCR NEGATIVE NEGATIVE Final    Comment: (NOTE) The Xpert Xpress SARS-CoV-2/FLU/RSV plus assay is intended as an aid in the diagnosis of influenza from Nasopharyngeal swab specimens and should not be used as a sole basis for treatment. Nasal washings and aspirates are unacceptable for Xpert Xpress SARS-CoV-2/FLU/RSV testing.  Fact Sheet for Patients: EntrepreneurPulse.com.au  Fact Sheet for Healthcare Providers: IncredibleEmployment.be  This test is not yet approved or cleared by the [place] FDA and has been authorized for detection and/or diagnosis of SARS-CoV-2 by FDA under an Emergency Use Authorization (EUA). This EUA will remain in effect (meaning this test can be used) for the duration of the COVID-19 declaration under Section 564(b)(1) of the Act, 21 U.S.C. section 360bbb-3(b)(1), unless the authorization is terminated or revoked.  Performed at [facility], [address] [place], [place] [address]   CULTURE, BLOOD (ROUTINE X 2) w Reflex to ID Panel     Status: Abnormal (Preliminary result)   Collection Time: 09/12/21 11:19 AM   Specimen: BLOOD  Result Value Ref Range Status   Specimen Description   Final    BLOOD LEFT HABD Performed at [facility], [address] [place], [place]  [address]    Special Requests   Final    BOTTLES DRAWN AEROBIC AND ANAEROBIC Blood Culture adequate volume Performed at [facility], [address] [place], [place] [address]    Culture  Setup Time   Final    Organism ID to follow GRAM NEGATIVE RODS ANAEROBIC BOTTLE ONLY CRITICAL RESULT CALLED TO, READ BACK BY AND VERIFIED WITH: JASON ROBBINS AT 0500 09/13/21.PMF Performed at [facility], [address] [place], [place] [address]    Culture (A)  Final    ESCHERICHIA COLI SUSCEPTIBILITIES TO FOLLOW Performed at [facility], [place] [address] [place], [place] [address]    Report Status PENDING  Incomplete  CULTURE, BLOOD (ROUTINE X 2) w Reflex to ID Panel     Status: None (Preliminary result)   Collection Time: 09/12/21 11:19 AM   Specimen: BLOOD  Result Value Ref Range Status   Specimen Description BLOOD RIGHT HAND  Final   Special Requests   Final    BOTTLES DRAWN AEROBIC AND ANAEROBIC Blood Culture adequate volume   Culture   Final    NO GROWTH 2 DAYS Performed at [facility], [address] [place], [place] [address]    Report Status PENDING  Incomplete  Urine Culture     Status:  Abnormal   Collection Time: 09/12/21 11:19 AM   Specimen: Urine, Random  Result Value Ref Range Status   Specimen Description   Final    URINE, RANDOM Performed at [facility], [address] [place], [place] [address]    Special Requests   Final    Normal Performed at [facility], [address] [place], [place] [address]    Culture MULTIPLE SPECIES PRESENT, SUGGEST RECOLLECTION (A)  Final   Report Status 09/13/2021 FINAL  Final  Blood Culture ID Panel (Reflexed)     Status: Abnormal   Collection Time: 09/12/21 11:19 AM  Result Value Ref Range Status   Enterococcus faecalis NOT DETECTED NOT DETECTED Final   Enterococcus Faecium NOT DETECTED NOT DETECTED Final   Listeria monocytogenes NOT DETECTED NOT DETECTED Final    Staphylococcus species NOT DETECTED NOT DETECTED Final   Staphylococcus aureus (BCID) NOT DETECTED NOT DETECTED Final   Staphylococcus epidermidis NOT DETECTED NOT DETECTED Final   Staphylococcus lugdunensis NOT DETECTED NOT DETECTED Final   Streptococcus species NOT DETECTED NOT DETECTED Final   Streptococcus agalactiae NOT DETECTED NOT DETECTED Final   Streptococcus pneumoniae NOT DETECTED NOT DETECTED Final   Streptococcus pyogenes NOT DETECTED NOT DETECTED Final   A.calcoaceticus-baumannii NOT DETECTED NOT DETECTED Final   Bacteroides fragilis NOT DETECTED NOT DETECTED Final   Enterobacterales DETECTED (A) NOT DETECTED Final    Comment: Enterobacterales represent a large order of gram negative bacteria, not a single organism. CRITICAL RESULT CALLED TO, READ BACK BY AND VERIFIED WITH: JASON ROBBINS AT 0500 09/13/21.PMF    Enterobacter cloacae complex NOT DETECTED NOT DETECTED Final   Escherichia coli DETECTED (A) NOT DETECTED Final    Comment: CRITICAL RESULT CALLED TO, READ BACK BY AND VERIFIED WITH: JASON ROBBINS AT 0500 09/13/21.PMF    Klebsiella aerogenes NOT DETECTED NOT DETECTED Final   Klebsiella oxytoca NOT DETECTED NOT DETECTED Final   Klebsiella pneumoniae NOT DETECTED NOT DETECTED Final   Proteus species NOT DETECTED NOT DETECTED Final   Salmonella species NOT DETECTED NOT DETECTED Final   Serratia marcescens NOT DETECTED NOT DETECTED Final   Haemophilus influenzae NOT DETECTED NOT DETECTED Final   Neisseria meningitidis NOT DETECTED NOT DETECTED Final   Pseudomonas aeruginosa NOT DETECTED NOT DETECTED Final   Stenotrophomonas maltophilia NOT DETECTED NOT DETECTED Final   Candida albicans NOT DETECTED NOT DETECTED Final   Candida auris NOT DETECTED NOT DETECTED Final   Candida glabrata NOT DETECTED NOT DETECTED Final   Candida krusei NOT DETECTED NOT DETECTED Final   Candida parapsilosis NOT DETECTED NOT DETECTED Final   Candida tropicalis NOT DETECTED NOT DETECTED  Final   Cryptococcus neoformans/gattii NOT DETECTED NOT DETECTED Final   CTX-M ESBL NOT DETECTED NOT DETECTED Final   Carbapenem resistance IMP NOT DETECTED NOT DETECTED Final   Carbapenem resistance KPC NOT DETECTED NOT DETECTED Final   Carbapenem resistance NDM NOT DETECTED NOT DETECTED Final   Carbapenem resist OXA 48 LIKE NOT DETECTED NOT DETECTED Final   Carbapenem resistance VIM NOT DETECTED NOT DETECTED Final    Comment: Performed at [facility], [address] [place], [place] [address]   *Note: Due to a large number of results and/or encounters for the requested time period, some results have not been displayed. A complete set of results can be found in Results Review.    Coagulation Studies: Recent Labs    09/12/21 0524  LABPROT 21.6*  INR 1.9*     Urinalysis: Recent Labs  09/12/21 1119  [place] 1.020  PHURINE 5.0  GLUCOSEU NEGATIVE  HGBUR MODERATE*  BILIRUBINUR NEGATIVE  KETONESUR NEGATIVE  PROTEINUR 100*  NITRITE NEGATIVE  LEUKOCYTESUR TRACE*       Imaging: No results found.   Medications:    cefTRIAXone (ROCEPHIN)  IV 2 g (09/14/21 1007)    ALPRAZolam  1 mg Oral QHS   apixaban  2.5 mg Oral BID   Chlorhexidine Gluconate Cloth  6 each Topical Daily   feeding supplement  1 Container Oral TID BM   fluticasone  2 spray Each Nare BID   folic acid  8,416 mcg Oral Daily   gabapentin  300 mg Oral BID AC & HS   hydroxychloroquine  200 mg Oral BID   mirabegron ER  50 mg Oral Daily   pantoprazole  40 mg Oral Daily   polyethylene glycol  17 g Oral QHS   pravastatin  20 mg Oral QHS   vitamin B-12  1,000 mcg Oral Daily   acetaminophen **OR** acetaminophen, chlorhexidine, loratadine, ondansetron **OR** ondansetron (ZOFRAN) IV  Assessment/ Plan:  [name] is a 87 y.o.  male with a past medical history of chronic diastolic heart failure, rheumatoid arthritis, chronic hyponatremia, chronic indwelling Foley, and  chronic kidney disease.  Patient presents to the emergency room with complaints of progressive fatigue.  He has been admitted for AKI (acute kidney injury) ([place]) [N17.9] Acute kidney injury ([place]) [N17.9] Hypotension, unspecified hypotension type [I95.9]   Acute Kidney Injury on chronic kidney disease stage 3a with baseline creatinine 1.2 and GFR of 58 on 08/10/21.  Acute kidney injury secondary to believed overdiuresis with poor appetite Chronic kidney disease is secondary to right nephrectomy No IV contrast exposure.  No indication for dialysis at this time.  Avoid nephrotoxic therapies. Creatinine continues to improve.  Diuretics held at this time and IV fluids discontinued by primary physician.  Instructed nursing to utilize TED hose to manage lower extremity edema Will monitor closely   Lab Results  Component Value Date   CREATININE 1.66 (H) 09/14/2021   CREATININE 2.03 (H) 09/13/2021   CREATININE 2.11 (H) 09/12/2021    Intake/Output Summary (Last 24 hours) at 09/14/2021 1236 Last data filed at 09/14/2021 0548 Gross per 24 hour  Intake --  Output 500 ml  Net -500 ml    2.  Chronic diastolic heart failure: Echo from June 2021 shows EF of 60 to 65%.  Currently followed by cardiology outpatient  3.  E. coli bacteremia: Currently prescribed Rocephin.  Follow-up urine culture and blood cultures schedule.   LOS: 3   10/18/202212:36 PM

## 2021-09-14 NOTE — TOC Progression Note (Addendum)
Transition of Care [facility]) - Progression Note    Patient Details  Name: [name] MRN: [id number] Date of Birth: 1934/08/21  Transition of Care [facility]) CM/SW [place], RN Phone Number: 09/14/2021, 3:15 PM  Clinical Narrative:    Met with the patient in the room and explained that his daughter called and is concerned about him going to [facility], We reviewed the other two option including the Star rating from Medicare, He agreed to send out to all facilities to see who has a bed available, I sent to all facilities in the hub, he is going to have his daughter review [facility] in case that is an option they may choose, he will let me know in the morning, Insurance will have to be updated   Expected Discharge Plan: Skilled Nursing Facility Barriers to Discharge: Continued Medical Work up  Expected Discharge Plan and Services Expected Discharge Plan: [place] arrangements for the past 2 months: Single Family Home                                       Social Determinants of Health (SDOH) Interventions    Readmission Risk Interventions Readmission Risk Prevention Plan 09/12/2021 06/23/2019  Transportation Screening Complete Complete  PCP or Specialist Appt within 3-5 Days - Complete  HRI or [place] - Complete  Social Work Consult for [place] Planning/Counseling - Complete  Palliative Care Screening - Not Applicable  Medication Review Press photographer) Complete Complete  PCP or Specialist appointment within 3-5 days of discharge Complete -  [place] or Home Care Consult Complete -  SW Recovery Care/Counseling Consult Complete -  Palliative Care Screening Not Applicable -  Skilled Nursing Facility Complete -  Some recent data might be hidden

## 2021-09-14 NOTE — TOC Progression Note (Signed)
Transition of Care [facility]) - Progression Note    Patient Details  Name: [name] MRN: [id number] Date of Birth: 1934/10/05  Transition of Care [facility]) CM/SW [place], RN Phone Number: 09/14/2021, 10:51 AM  Clinical Narrative:    Got insuracne approval to go to [facility] 10/18-10/20 [phone], notified [facility]   Expected Discharge Plan: [place] Barriers to Discharge: Continued Medical Work up  Expected Discharge Plan and Services Expected Discharge Plan: [place]       Living arrangements for the past 2 months: Single Family Home                                       Social Determinants of Health (SDOH) Interventions    Readmission Risk Interventions Readmission Risk Prevention Plan 09/12/2021 06/23/2019  Transportation Screening Complete Complete  PCP or Specialist Appt within 3-5 Days - Complete  HRI or [place] - Complete  Social Work Consult for [place] Planning/Counseling - Complete  Palliative Care Screening - Not Applicable  Medication Review Press photographer) Complete Complete  PCP or Specialist appointment within 3-5 days of discharge Complete -  [place] or Home Care Consult Complete -  SW Recovery Care/Counseling Consult Complete -  Palliative Care Screening Not Applicable -  Skilled Nursing Facility Complete -  Some recent data might be hidden

## 2021-09-14 NOTE — TOC Progression Note (Signed)
Transition of Care [facility]) - Progression Note    Patient Details  Name: [name] MRN: [id number] Date of Birth: [birth year]  Transition of Care [facility]) CM/SW [place], RN Phone Number: 09/14/2021, 4:48 PM  Clinical Narrative:   Spoke with the patient and his daughter [name] in the room with [name] on the speaker phone, discussed bed options, they chose to go to [facility], I [facility] with a secure VM to the admissions department and thru the Hub, will resubmit Ins tomorrow    Expected Discharge Plan: Skilled Nursing Facility Barriers to Discharge: Continued Medical Work up  Expected Discharge Plan and Services Expected Discharge Plan: [place] arrangements for the past 2 months: Single Family Home                                       Social Determinants of Health (SDOH) Interventions    Readmission Risk Interventions Readmission Risk Prevention Plan 09/12/2021 06/23/2019  Transportation Screening Complete Complete  PCP or Specialist Appt within 3-5 Days - Complete  HRI or [place] - Complete  Social Work Consult for [place] Planning/Counseling - Complete  Palliative Care Screening - Not Applicable  Medication Review Press photographer) Complete Complete  PCP or Specialist appointment within 3-5 days of discharge Complete -  [place] or Home Care Consult Complete -  SW Recovery Care/Counseling Consult Complete -  Palliative Care Screening Not Applicable -  Skilled Nursing Facility Complete -  Some recent data might be hidden

## 2021-09-15 DIAGNOSIS — N179 Acute kidney failure, unspecified: Secondary | ICD-10-CM | POA: Diagnosis not present

## 2021-09-15 DIAGNOSIS — I480 Paroxysmal atrial fibrillation: Secondary | ICD-10-CM | POA: Diagnosis not present

## 2021-09-15 DIAGNOSIS — D61818 Other pancytopenia: Secondary | ICD-10-CM | POA: Diagnosis not present

## 2021-09-15 DIAGNOSIS — R7881 Bacteremia: Secondary | ICD-10-CM | POA: Diagnosis not present

## 2021-09-15 LAB — HEPATIC FUNCTION PANEL
ALT: 20 U/L (ref 0–44)
AST: 26 U/L (ref 15–41)
Albumin: 2.3 g/dL — ABNORMAL LOW (ref 3.5–5.0)
Alkaline Phosphatase: 38 U/L (ref 38–126)
Bilirubin, Direct: 0.1 mg/dL (ref 0.0–0.2)
Indirect Bilirubin: 0.4 mg/dL (ref 0.3–0.9)
Total Bilirubin: 0.5 mg/dL (ref 0.3–1.2)
Total Protein: 4.8 g/dL — ABNORMAL LOW (ref 6.5–8.1)

## 2021-09-15 LAB — RENAL FUNCTION PANEL
Albumin: 2.3 g/dL — ABNORMAL LOW (ref 3.5–5.0)
Anion gap: 10 (ref 5–15)
BUN: 29 mg/dL — ABNORMAL HIGH (ref 8–23)
CO2: 25 mmol/L (ref 22–32)
Calcium: 7.8 mg/dL — ABNORMAL LOW (ref 8.9–10.3)
Chloride: 99 mmol/L (ref 98–111)
Creatinine, Ser: 1.19 mg/dL (ref 0.61–1.24)
GFR, Estimated: 60 mL/min — ABNORMAL LOW (ref >60)
Glucose, Bld: 104 mg/dL — ABNORMAL HIGH (ref 70–99)
Phosphorus: 2.6 mg/dL (ref 2.5–4.6)
Potassium: 3.3 mmol/L — ABNORMAL LOW (ref 3.5–5.1)
Sodium: 134 mmol/L — ABNORMAL LOW (ref 135–145)

## 2021-09-15 LAB — CBC WITH DIFFERENTIAL/PLATELET
Abs Immature Granulocytes: 0.03 10*3/uL (ref 0.00–0.07)
Basophils Absolute: 0 10*3/uL (ref 0.0–0.1)
Basophils Relative: 1 %
Eosinophils Absolute: 0.1 10*3/uL (ref 0.0–0.5)
Eosinophils Relative: 2 %
HCT: 22.6 % — ABNORMAL LOW (ref 39.0–52.0)
Hemoglobin: 7.9 g/dL — ABNORMAL LOW (ref 13.0–17.0)
Immature Granulocytes: 1 %
Lymphocytes Relative: 12 %
Lymphs Abs: 0.5 10*3/uL — ABNORMAL LOW (ref 0.7–4.0)
MCH: 35 pg — ABNORMAL HIGH (ref 26.0–34.0)
MCHC: 35 g/dL (ref 30.0–36.0)
MCV: 100 fL (ref 80.0–100.0)
Monocytes Absolute: 0.5 10*3/uL (ref 0.1–1.0)
Monocytes Relative: 14 %
Neutro Abs: 2.8 10*3/uL (ref 1.7–7.7)
Neutrophils Relative %: 70 %
Platelets: 58 10*3/uL — ABNORMAL LOW (ref 150–400)
RBC: 2.26 MIL/uL — ABNORMAL LOW (ref 4.22–5.81)
RDW: 14.5 % (ref 11.5–15.5)
WBC: 4 10*3/uL (ref 4.0–10.5)
nRBC: 0 % (ref 0.0–0.2)

## 2021-09-15 LAB — MAGNESIUM: Magnesium: 2 mg/dL (ref 1.7–2.4)

## 2021-09-15 LAB — CULTURE, BLOOD (ROUTINE X 2): Special Requests: ADEQUATE

## 2021-09-15 NOTE — TOC Progression Note (Signed)
Transition of Care [facility]) - Progression Note    Patient Details  Name: [name] MRN: [id number] Date of Birth: 1934-02-09  Transition of Care [facility]) CM/SW [place], RN Phone Number: 09/15/2021, 12:12 PM  Clinical Narrative:   [name] confirmed that they will accept the patient and will have a bed on Friday, I spoke to the patient's daughter [name] and let her know, I resubmitted to the insurance company requesting a change in the facility    Expected Discharge Plan: [place] Barriers to Discharge: Continued Medical Work up  Expected Discharge Plan and Services Expected Discharge Plan: [place] arrangements for the past 2 months: Single Family Home                                       Social Determinants of Health (SDOH) Interventions    Readmission Risk Interventions Readmission Risk Prevention Plan 09/12/2021 06/23/2019  Transportation Screening Complete Complete  PCP or Specialist Appt within 3-5 Days - Complete  HRI or [place] - Complete  Social Work Consult for [place] Planning/Counseling - Complete  Palliative Care Screening - Not Applicable  Medication Review Press photographer) Complete Complete  PCP or Specialist appointment within 3-5 days of discharge Complete -  [place] or Home Care Consult Complete -  SW Recovery Care/Counseling Consult Complete -  Palliative Care Screening Not Applicable -  Skilled Nursing Facility Complete -  Some recent data might be hidden

## 2021-09-15 NOTE — Progress Notes (Signed)
PROGRESS NOTE    OSA CAMPOLI  HDQ:[id number] DOB: Aug 27, 1934 DOA: 09/11/2021 PCP: [name], MD   Brief Narrative:  The patient is an 87 year old Caucasian male with a past medical history significant for 2 right renal nephrectomy given renal mass, chronic hyponatremia, chronic indwelling Foley catheter, chronic diastolic CHF, essential hypertension, persistent atrial fibrillation as well as other comorbidities who presented with hypotension initially.  Initial urinalysis showed blood but nitrates and leuk esterase was negative.  Initially antibiotics were held off but he spiked a temperature of 103 and blood cultures were drawn and E. coli was shown growing out of 1 out of 4 bottles.  His Foley catheter was changed out by urology and he is placed on IV ceftriaxone.  Nephrology been following given his AKI on admission and his IV fluids have now been stopped.  The E. coli bacteremia sensitivities came back and he is sensitive to cefazolin, Zosyn, imipenem, ciprofloxacin, and ceftriaxone which we will continue.  We will obtain PT OT for further evaluate and treat.  Assessment & Plan:   Active Problems:   Fever   AF (paroxysmal atrial fibrillation) (HCC)   Pancytopenia (HCC)   Acute kidney injury superimposed on CKD (HCC)   E coli bacteremia  E. coli bacteremia -Had a episode of hypotension when he came in and then spiked a temperature of 103 on 09/13/2019 -And a temperature of 12:101 the night before.  He is continuing on IV ceftriaxone and sensitivities showing that he is sensitive to cefazolin, Zosyn, imipenem, ciprofloxacin and ceftriaxone. -Foley catheter was changed -Urine culture showed multiple organisms -Continue empiric antibiotics and monitor clinical response to intervention -We will message ID for the gram-negative bacteremia pilot recommendations -PT OT evaluated and recommending SNF  AKI on CKD stage IIIa -Improving as patient's BUN/creatinine went from 36/2.2 and  now trending down to 29/1.19' -Baseline creatinine is around 1.1-1.3 and in the setting of his right renal nephrectomy -Avoid further nephrotoxic medications, contrast dyes, hypotension renally dose medications -Repeat CMP in the a.m. and nephrology was consulted and recommending no further IV fluids  Hyponatremia -Mild.  Patient sodium is now 134 -Continue to monitor and trend and repeat CMP in a.m.  Hypokalemia -Patient's potassium was 3.3 -Mag level was 2.0 -Replete with p.o. KCl 40 mill colons twice daily -Continue monitor replete as necessary -Repeat CMP in a.m.  Macrocytic anemia/anemia of chronic disease -Patient's BUN/creatinine went from 11.1/33.3 and likely hemoconcentrated on admission and trended down to 7.9/22.6 -Continue to monitor for signs and symptoms of bleeding; currently no overt bleeding noted -Check anemia panel in a.m. -Repeat CBC in a.m.  Thrombocytopenia -Likely in the setting of infection as above -Patient's platelet count went from 82 and trended down to 52 and now is 58 -Continue monitor for signs of bleeding; currently no overt bleeding noted -Repeat CBC in a.m.  Paroxysmal atrial fibrillation -On low-dose Eliquis at 2.5 mg p.o. twice daily -Patient was restarted on his Toprol succinate 12.5 g p.o. daily.  Rheumatoid arthritis -Continue with Plaquenil  Hyperlipidemia -Continue with pravastatin 20 mg p.o. nightly  Neuropathy -Dose adjusted Gabapentin 300 mg p.o. twice daily  Pancytopenia -Pathology is reviewed and showed giant platelets and no schistocytes -Continue monitor CBCs and his leukopenia has now resolved  Chronic diastolic CHF -Echo in June 2021: EF of 60 to 65% -Nephrology recommending continue to hold diuretics at this time given his AKI in the setting of his overdiuresis with poor appetite -Continue monitor for signs and symptoms  of volume overload and will need strict I's and O's and daily weights  Obesity -Complicates  overall prognosis and Care -Estimated body mass index is 30.93 kg/m as calculated from the following:   Height as of 05/28/21: 5\' 9"  (1.753 m).   Weight as of this encounter: 95 kg. -Weight Loss and Dietary Counseling   DVT prophylaxis: SCD he is anticoagulated with apixaban 2.5 mg p.o. twice daily Code Status: FULL CODE Family Communication: No family currently at bedside Disposition Plan: Anticipating discharging to SNF within the next 48 hours  Status is: Inpatient  Remains inpatient appropriate because: Patient needs treatment of his bacteremia and evaluation of his renal function.  PT OT recommending SNF and will need a safe discharge disposition and bed and insurance authorization before he can be discharged   Consultants:  Nephrology  Procedures: None  Antimicrobials:  Anti-infectives (From admission, onward)    Start     Dose/Rate Route Frequency Ordered Stop   09/13/21 1000  cefTRIAXone (ROCEPHIN) 2 g in sodium chloride 0.9 % 100 mL IVPB        2 g 200 mL/hr over 30 Minutes Intravenous Every 24 hours 09/13/21 0624     09/12/21 1000  piperacillin-tazobactam (ZOSYN) IVPB 3.375 g  Status:  Discontinued        3.375 g 12.5 mL/hr over 240 Minutes Intravenous Every 8 hours 09/12/21 0858 09/13/21 0622   09/11/21 2200  hydroxychloroquine (PLAQUENIL) tablet 200 mg        200 mg Oral 2 times daily 09/11/21 1758          Subjective: Seen and examined at bedside and he was doing fairly well.  Denied any chest pain or shortness of breath.  No nausea or vomiting.  Thinks he is doing okay and his legs were not as swollen.  Denied any concerns or complaints at this time.  Objective: Vitals:   09/14/21 1232 09/14/21 2210 09/15/21 0500 09/15/21 0855  BP: (!) 126/50 (!) 147/55 136/68 (!) 132/50  Pulse: 78 81 85 76  Resp: 16 17 18 16   Temp: 99.1 F (37.3 C) 98.3 F (36.8 C) 98.5 F (36.9 C) (!) 97.5 F (36.4 C)  TempSrc:   Oral Oral  SpO2: 94% 95% 96% 96%  Weight:         Intake/Output Summary (Last 24 hours) at 09/15/2021 0947 Last data filed at 09/15/2021 0500 Gross per 24 hour  Intake --  Output 1100 ml  Net -1100 ml   Filed Weights   09/11/21 1219  Weight: 95 kg   Examination: Physical Exam:  Constitutional: WN/WD obese Caucasian male in NAD and appears calm and comfortable Eyes: Lids and conjunctivae normal, sclerae anicteric  ENMT: External Ears, Nose appear normal. Grossly normal hearing. Mucous membranes are moist. Neck: Appears normal, supple, no cervical masses, normal ROM, no appreciable thyromegaly; no appreciable JVD Respiratory: Diminished to auscultation bilaterally with coarse breath sounds, no wheezing, rales, rhonchi or crackles. Normal respiratory effort and patient is not tachypenic. No accessory muscle use. Unlabored Breathing  Cardiovascular: RRR, no murmurs / rubs / gallops. S1 and S2 auscultated. 1+ LE edema Abdomen: Soft, non-tender, Distended. Bowel sounds positive.  GU: Deferred. Musculoskeletal: No clubbing / cyanosis of digits/nails. No joint deformity upper and lower extremities. Skin: No rashes, lesions, ulcers on a limited skin evaluation. No induration; Warm and dry.  Neurologic: CN 2-12 grossly intact with no focal deficits. Romberg sign and cerebellar reflexes not assessed.  Psychiatric: Normal judgment and insight. Alert  and oriented x 3. Normal mood and appropriate affect.   Data Reviewed: I have personally reviewed following labs and imaging studies  CBC: Recent Labs  Lab 09/11/21 1405 09/12/21 0524 09/13/21 0457 09/14/21 0554  WBC 9.5 5.1 2.7* 3.3*  NEUTROABS 7.7 4.6  --   --   HGB 11.1* 9.1* 8.4* 8.3*  HCT 33.3* 26.9* 24.3* 24.8*  MCV 103.1* 105.9* 102.1* 102.9*  PLT 82* 69* 61* 52*   Basic Metabolic Panel: Recent Labs  Lab 09/11/21 1405 09/12/21 0524 09/13/21 0457 09/14/21 0554  NA 135 134* 131* 134*  K 4.4 4.1 3.8 3.7  CL 96* 95* 97* 98  CO2 27 30 28 26   GLUCOSE 106* 100* 105* 102*   BUN 36* 37* 39* 37*  CREATININE 2.28* 2.11* 2.03* 1.66*  CALCIUM 8.7* 8.2* 7.6* 7.7*   GFR: Estimated Creatinine Clearance: 37 mL/min (A) (by C-G formula based on SCr of 1.66 mg/dL (H)). Liver Function Tests: Recent Labs  Lab 09/11/21 1405  AST 46*  ALT 32  ALKPHOS 48  BILITOT 1.0  PROT 6.6  ALBUMIN 3.6   No results for input(s): LIPASE, AMYLASE in the last 168 hours. No results for input(s): AMMONIA in the last 168 hours. Coagulation Profile: Recent Labs  Lab 09/12/21 0524  INR 1.9*   Cardiac Enzymes: No results for input(s): CKTOTAL, CKMB, CKMBINDEX, TROPONINI in the last 168 hours. BNP (last 3 results) No results for input(s): PROBNP in the last 8760 hours. HbA1C: No results for input(s): HGBA1C in the last 72 hours. CBG: No results for input(s): GLUCAP in the last 168 hours. Lipid Profile: No results for input(s): CHOL, HDL, LDLCALC, TRIG, CHOLHDL, LDLDIRECT in the last 72 hours. Thyroid Function Tests: No results for input(s): TSH, T4TOTAL, FREET4, T3FREE, THYROIDAB in the last 72 hours. Anemia Panel: No results for input(s): VITAMINB12, FOLATE, FERRITIN, TIBC, IRON, RETICCTPCT in the last 72 hours. Sepsis Labs: Recent Labs  Lab 09/11/21 1837  LATICACIDVEN 1.6    Recent Results (from the past 240 hour(s))  Resp Panel by RT-PCR (Flu A&B, Covid) Urine, Catheterized     Status: None   Collection Time: 09/11/21  3:41 PM   Specimen: Urine, Catheterized; Nasopharyngeal(NP) swabs in vial transport medium  Result Value Ref Range Status   SARS Coronavirus 2 by RT PCR NEGATIVE NEGATIVE Final    Comment: (NOTE) SARS-CoV-2 target nucleic acids are NOT DETECTED.  The SARS-CoV-2 RNA is generally detectable in upper respiratory specimens during the acute phase of infection. The lowest concentration of SARS-CoV-2 viral copies this assay can detect is 138 copies/mL. A negative result does not preclude SARS-Cov-2 infection and should not be used as the sole basis for  treatment or other patient management decisions. A negative result may occur with  improper specimen collection/handling, submission of specimen other than nasopharyngeal swab, presence of viral mutation(s) within the areas targeted by this assay, and inadequate number of viral copies(<138 copies/mL). A negative result must be combined with clinical observations, patient history, and epidemiological information. The expected result is Negative.  Fact Sheet for Patients:  EntrepreneurPulse.com.au  Fact Sheet for Healthcare Providers:  IncredibleEmployment.be  This test is no t yet approved or cleared by the [place] FDA and  has been authorized for detection and/or diagnosis of SARS-CoV-2 by FDA under an Emergency Use Authorization (EUA). This EUA will remain  in effect (meaning this test can be used) for the duration of the COVID-19 declaration under Section 564(b)(1) of the Act, 21 U.S.C.section 360bbb-3(b)(1), unless  the authorization is terminated  or revoked sooner.       Influenza A by PCR NEGATIVE NEGATIVE Final   Influenza B by PCR NEGATIVE NEGATIVE Final    Comment: (NOTE) The Xpert Xpress SARS-CoV-2/FLU/RSV plus assay is intended as an aid in the diagnosis of influenza from Nasopharyngeal swab specimens and should not be used as a sole basis for treatment. Nasal washings and aspirates are unacceptable for Xpert Xpress SARS-CoV-2/FLU/RSV testing.  Fact Sheet for Patients: EntrepreneurPulse.com.au  Fact Sheet for Healthcare Providers: IncredibleEmployment.be  This test is not yet approved or cleared by the [place] FDA and has been authorized for detection and/or diagnosis of SARS-CoV-2 by FDA under an Emergency Use Authorization (EUA). This EUA will remain in effect (meaning this test can be used) for the duration of the COVID-19 declaration under Section 564(b)(1) of the Act, 21  U.S.C. section 360bbb-3(b)(1), unless the authorization is terminated or revoked.  Performed at [facility], [address] [place], [place] [address]   CULTURE, BLOOD (ROUTINE X 2) w Reflex to ID Panel     Status: Abnormal   Collection Time: 09/12/21 11:19 AM   Specimen: BLOOD  Result Value Ref Range Status   Specimen Description   Final    BLOOD LEFT HABD Performed at [facility], [address] [place], [place] [address]    Special Requests   Final    BOTTLES DRAWN AEROBIC AND ANAEROBIC Blood Culture adequate volume Performed at [facility], [address], [place], [place] [address]    Culture  Setup Time   Final    GRAM NEGATIVE RODS ANAEROBIC BOTTLE ONLY CRITICAL RESULT CALLED TO, READ BACK BY AND VERIFIED WITH: JASON ROBBINS AT 0500 09/13/21.PMF Performed at [facility], [place] [address] [place], [place] [address]    Culture ESCHERICHIA COLI (A)  Final   Report Status 09/15/2021 FINAL  Final   Organism ID, Bacteria ESCHERICHIA COLI  Final      Susceptibility   Escherichia coli - MIC*    AMPICILLIN >=32 RESISTANT Resistant     CEFAZOLIN 8 SENSITIVE Sensitive     CEFEPIME <=0.12 SENSITIVE Sensitive     CEFTAZIDIME <=1 SENSITIVE Sensitive     CEFTRIAXONE <=0.25 SENSITIVE Sensitive     CIPROFLOXACIN <=0.25 SENSITIVE Sensitive     GENTAMICIN >=16 RESISTANT Resistant     IMIPENEM <=0.25 SENSITIVE Sensitive     TRIMETH/SULFA >=320 RESISTANT Resistant     AMPICILLIN/SULBACTAM >=32 RESISTANT Resistant     PIP/TAZO <=4 SENSITIVE Sensitive     * ESCHERICHIA COLI  CULTURE, BLOOD (ROUTINE X 2) w Reflex to ID Panel     Status: None (Preliminary result)   Collection Time: 09/12/21 11:19 AM   Specimen: BLOOD  Result Value Ref Range Status   Specimen Description BLOOD RIGHT HAND  Final   Special Requests   Final    BOTTLES DRAWN AEROBIC AND ANAEROBIC Blood Culture adequate volume   Culture   Final    NO GROWTH 3 DAYS Performed at  [facility], [address] [place], [place] [address]    Report Status PENDING  Incomplete  Urine Culture     Status: Abnormal   Collection Time: 09/12/21 11:19 AM   Specimen: Urine, Random  Result Value Ref Range Status   Specimen Description   Final    URINE, RANDOM Performed at [facility], [address] [place], [place] [address]    Special Requests   Final  Normal Performed at [facility], [address] [place], [place] [address]    Culture MULTIPLE SPECIES PRESENT, SUGGEST RECOLLECTION (A)  Final   Report Status 09/13/2021 FINAL  Final  Blood Culture ID Panel (Reflexed)     Status: Abnormal   Collection Time: 09/12/21 11:19 AM  Result Value Ref Range Status   Enterococcus faecalis NOT DETECTED NOT DETECTED Final   Enterococcus Faecium NOT DETECTED NOT DETECTED Final   Listeria monocytogenes NOT DETECTED NOT DETECTED Final   Staphylococcus species NOT DETECTED NOT DETECTED Final   Staphylococcus aureus (BCID) NOT DETECTED NOT DETECTED Final   Staphylococcus epidermidis NOT DETECTED NOT DETECTED Final   Staphylococcus lugdunensis NOT DETECTED NOT DETECTED Final   Streptococcus species NOT DETECTED NOT DETECTED Final   Streptococcus agalactiae NOT DETECTED NOT DETECTED Final   Streptococcus pneumoniae NOT DETECTED NOT DETECTED Final   Streptococcus pyogenes NOT DETECTED NOT DETECTED Final   A.calcoaceticus-baumannii NOT DETECTED NOT DETECTED Final   Bacteroides fragilis NOT DETECTED NOT DETECTED Final   Enterobacterales DETECTED (A) NOT DETECTED Final    Comment: Enterobacterales represent a large order of gram negative bacteria, not a single organism. CRITICAL RESULT CALLED TO, READ BACK BY AND VERIFIED WITH: JASON ROBBINS AT 0500 09/13/21.PMF    Enterobacter cloacae complex NOT DETECTED NOT DETECTED Final   Escherichia coli DETECTED (A) NOT DETECTED Final    Comment: CRITICAL RESULT CALLED TO, READ BACK BY AND VERIFIED WITH: JASON  ROBBINS AT 0500 09/13/21.PMF    Klebsiella aerogenes NOT DETECTED NOT DETECTED Final   Klebsiella oxytoca NOT DETECTED NOT DETECTED Final   Klebsiella pneumoniae NOT DETECTED NOT DETECTED Final   Proteus species NOT DETECTED NOT DETECTED Final   Salmonella species NOT DETECTED NOT DETECTED Final   Serratia marcescens NOT DETECTED NOT DETECTED Final   Haemophilus influenzae NOT DETECTED NOT DETECTED Final   Neisseria meningitidis NOT DETECTED NOT DETECTED Final   Pseudomonas aeruginosa NOT DETECTED NOT DETECTED Final   Stenotrophomonas maltophilia NOT DETECTED NOT DETECTED Final   Candida albicans NOT DETECTED NOT DETECTED Final   Candida auris NOT DETECTED NOT DETECTED Final   Candida glabrata NOT DETECTED NOT DETECTED Final   Candida krusei NOT DETECTED NOT DETECTED Final   Candida parapsilosis NOT DETECTED NOT DETECTED Final   Candida tropicalis NOT DETECTED NOT DETECTED Final   Cryptococcus neoformans/gattii NOT DETECTED NOT DETECTED Final   CTX-M ESBL NOT DETECTED NOT DETECTED Final   Carbapenem resistance IMP NOT DETECTED NOT DETECTED Final   Carbapenem resistance KPC NOT DETECTED NOT DETECTED Final   Carbapenem resistance NDM NOT DETECTED NOT DETECTED Final   Carbapenem resist OXA 48 LIKE NOT DETECTED NOT DETECTED Final   Carbapenem resistance VIM NOT DETECTED NOT DETECTED Final    Comment: Performed at [facility], [address] [place], [place] [address]    RN Pressure Injury Documentation:     Estimated body mass index is 30.93 kg/m as calculated from the following:   Height as of 05/28/21: 5\' 9"  (1.753 m).   Weight as of this encounter: 95 kg.  Malnutrition Type:   Malnutrition Characteristics:   Nutrition Interventions:   Radiology Studies: No results found.  Scheduled Meds:  ALPRAZolam  1 mg Oral QHS   apixaban  2.5 mg Oral BID   Chlorhexidine Gluconate Cloth  6 each Topical Daily   feeding supplement  1 Container Oral TID BM   fluticasone  2  spray Each Nare BID   folic acid  4,888  mcg Oral Daily   gabapentin  300 mg Oral BID AC & HS   hydroxychloroquine  200 mg Oral BID   metoprolol succinate  12.5 mg Oral QHS   mirabegron ER  50 mg Oral Daily   pantoprazole  40 mg Oral Daily   polyethylene glycol  17 g Oral QHS   pravastatin  20 mg Oral QHS   vitamin B-12  1,000 mcg Oral Daily   Continuous Infusions:  cefTRIAXone (ROCEPHIN)  IV 2 g (09/15/21 0853)     LOS: 4 days   Kerney Elbe, DO [facility] PAGER is on [place]  If 7PM-7AM, please contact night-coverage [email or web address]

## 2021-09-15 NOTE — Progress Notes (Signed)
[facility]  ROUNDING NOTE   Subjective:   [name] is an 87 year old male with a past medical history of chronic diastolic heart failure, rheumatoid arthritis, chronic hyponatremia, chronic indwelling Foley, and chronic kidney disease.  Patient presents to the emergency room with complaints of progressive fatigue.  He has been admitted for AKI (acute kidney injury) ([place]) [N17.9] Acute kidney injury ([place]) [N17.9] Hypotension, unspecified hypotension type [I95.9]  Patient is followed by our clinic and receives care from Dr. Holley Raring.   Patient states he feels better today Denies any leg pain or discomfort TED hose in place Foley catheter in place  Objective:  Vital signs in last 24 hours:  Temp:  [97.5 F (36.4 C)-99.1 F (37.3 C)] 97.5 F (36.4 C) (10/19 0855) Pulse Rate:  [76-85] 76 (10/19 0855) Resp:  [16-18] 16 (10/19 0855) BP: (126-147)/(50-68) 132/50 (10/19 0855) SpO2:  [94 %-96 %] 96 % (10/19 0855)  Weight change:  Filed Weights   09/11/21 1219  Weight: 95 kg    Intake/Output: I/O last 3 completed shifts: In: -  Out: 1600 [Urine:1600]   Intake/Output this shift:  Total I/O In: 300 [IV Piggyback:300] Out: -   Physical Exam: General: NAD, resting in bed  Head: Normocephalic, atraumatic. Moist oral mucosal membranes  Eyes: Anicteric  Lungs:  Clear to auscultation, normal breathing effort  Heart: Regular rate and rhythm  Abdomen:  Soft, nontender  Extremities:  1+ peripheral edema.  Neurologic: Nonfocal, moving all four extremities  Skin: No lesions  GU Chronic Foley     Basic Metabolic Panel: Recent Labs  Lab 09/11/21 1405 09/12/21 0524 09/13/21 0457 09/14/21 0554 09/15/21 0845 09/15/21 0913  NA 135 134* 131* 134* 134*  --   K 4.4 4.1 3.8 3.7 3.3*  --   CL 96* 95* 97* 98 99  --   CO2 27 30 28 26 25   --   GLUCOSE 106* 100* 105* 102* 104*  --   BUN 36* 37* 39* 37* 29*  --   CREATININE 2.28* 2.11* 2.03* 1.66* 1.19  --   CALCIUM  8.7* 8.2* 7.6* 7.7* 7.8*  --   MG  --   --   --   --   --  2.0  PHOS  --   --   --   --  2.6  --      Liver Function Tests: Recent Labs  Lab 09/11/21 1405 09/15/21 0845 09/15/21 0913  AST 46*  --  26  ALT 32  --  20  ALKPHOS 48  --  38  BILITOT 1.0  --  0.5  PROT 6.6  --  4.8*  ALBUMIN 3.6 2.3* 2.3*    No results for input(s): LIPASE, AMYLASE in the last 168 hours. No results for input(s): AMMONIA in the last 168 hours.  CBC: Recent Labs  Lab 09/11/21 1405 09/12/21 0524 09/13/21 0457 09/14/21 0554 09/15/21 0913  WBC 9.5 5.1 2.7* 3.3* 4.0  NEUTROABS 7.7 4.6  --   --  2.8  HGB 11.1* 9.1* 8.4* 8.3* 7.9*  HCT 33.3* 26.9* 24.3* 24.8* 22.6*  MCV 103.1* 105.9* 102.1* 102.9* 100.0  PLT 82* 69* 61* 52* 58*     Cardiac Enzymes: No results for input(s): CKTOTAL, CKMB, CKMBINDEX, TROPONINI in the last 168 hours.  BNP: Invalid input(s): POCBNP  CBG: No results for input(s): GLUCAP in the last 168 hours.  Microbiology: Results for orders placed or performed during the hospital encounter of 09/11/21  Resp Panel  by RT-PCR (Flu A&B, Covid) Urine, Catheterized     Status: None   Collection Time: 09/11/21  3:41 PM   Specimen: Urine, Catheterized; Nasopharyngeal(NP) swabs in vial transport medium  Result Value Ref Range Status   SARS Coronavirus 2 by RT PCR NEGATIVE NEGATIVE Final    Comment: (NOTE) SARS-CoV-2 target nucleic acids are NOT DETECTED.  The SARS-CoV-2 RNA is generally detectable in upper respiratory specimens during the acute phase of infection. The lowest concentration of SARS-CoV-2 viral copies this assay can detect is 138 copies/mL. A negative result does not preclude SARS-Cov-2 infection and should not be used as the sole basis for treatment or other patient management decisions. A negative result may occur with  improper specimen collection/handling, submission of specimen other than nasopharyngeal swab, presence of viral mutation(s) within the areas  targeted by this assay, and inadequate number of viral copies(<138 copies/mL). A negative result must be combined with clinical observations, patient history, and epidemiological information. The expected result is Negative.  Fact Sheet for Patients:  EntrepreneurPulse.com.au  Fact Sheet for Healthcare Providers:  IncredibleEmployment.be  This test is no t yet approved or cleared by the [place] FDA and  has been authorized for detection and/or diagnosis of SARS-CoV-2 by FDA under an Emergency Use Authorization (EUA). This EUA will remain  in effect (meaning this test can be used) for the duration of the COVID-19 declaration under Section 564(b)(1) of the Act, 21 U.S.C.section 360bbb-3(b)(1), unless the authorization is terminated  or revoked sooner.       Influenza A by PCR NEGATIVE NEGATIVE Final   Influenza B by PCR NEGATIVE NEGATIVE Final    Comment: (NOTE) The Xpert Xpress SARS-CoV-2/FLU/RSV plus assay is intended as an aid in the diagnosis of influenza from Nasopharyngeal swab specimens and should not be used as a sole basis for treatment. Nasal washings and aspirates are unacceptable for Xpert Xpress SARS-CoV-2/FLU/RSV testing.  Fact Sheet for Patients: EntrepreneurPulse.com.au  Fact Sheet for Healthcare Providers: IncredibleEmployment.be  This test is not yet approved or cleared by the [place] FDA and has been authorized for detection and/or diagnosis of SARS-CoV-2 by FDA under an Emergency Use Authorization (EUA). This EUA will remain in effect (meaning this test can be used) for the duration of the COVID-19 declaration under Section 564(b)(1) of the Act, 21 U.S.C. section 360bbb-3(b)(1), unless the authorization is terminated or revoked.  Performed at [facility], [address] [place], [place] [address]   CULTURE, BLOOD (ROUTINE X 2) w Reflex to ID Panel     Status:  Abnormal   Collection Time: 09/12/21 11:19 AM   Specimen: BLOOD  Result Value Ref Range Status   Specimen Description   Final    BLOOD LEFT HABD Performed at [facility], [address], [place], [place] [address]    Special Requests   Final    BOTTLES DRAWN AEROBIC AND ANAEROBIC Blood Culture adequate volume Performed at [facility], [address], [place], [place] [address]    Culture  Setup Time   Final    GRAM NEGATIVE RODS ANAEROBIC BOTTLE ONLY CRITICAL RESULT CALLED TO, READ BACK BY AND VERIFIED WITH: JASON ROBBINS AT 0500 09/13/21.PMF Performed at [facility], [place] [address] [place], [place] [address]    Culture ESCHERICHIA COLI (A)  Final   Report Status 09/15/2021 FINAL  Final   Organism ID, Bacteria ESCHERICHIA COLI  Final      Susceptibility   Escherichia coli - MIC*  AMPICILLIN >=32 RESISTANT Resistant     CEFAZOLIN 8 SENSITIVE Sensitive     CEFEPIME <=0.12 SENSITIVE Sensitive     CEFTAZIDIME <=1 SENSITIVE Sensitive     CEFTRIAXONE <=0.25 SENSITIVE Sensitive     CIPROFLOXACIN <=0.25 SENSITIVE Sensitive     GENTAMICIN >=16 RESISTANT Resistant     IMIPENEM <=0.25 SENSITIVE Sensitive     TRIMETH/SULFA >=320 RESISTANT Resistant     AMPICILLIN/SULBACTAM >=32 RESISTANT Resistant     PIP/TAZO <=4 SENSITIVE Sensitive     * ESCHERICHIA COLI  CULTURE, BLOOD (ROUTINE X 2) w Reflex to ID Panel     Status: None (Preliminary result)   Collection Time: 09/12/21 11:19 AM   Specimen: BLOOD  Result Value Ref Range Status   Specimen Description BLOOD RIGHT HAND  Final   Special Requests   Final    BOTTLES DRAWN AEROBIC AND ANAEROBIC Blood Culture adequate volume   Culture   Final    NO GROWTH 3 DAYS Performed at [facility], [address] [place], [place] [address]    Report Status PENDING  Incomplete  Urine Culture     Status: Abnormal   Collection Time: 09/12/21 11:19 AM   Specimen: Urine, Random  Result Value Ref Range Status    Specimen Description   Final    URINE, RANDOM Performed at [facility], [address] [place], [place] [address]    Special Requests   Final    Normal Performed at [facility], [address] [place], [place] [address]    Culture MULTIPLE SPECIES PRESENT, SUGGEST RECOLLECTION (A)  Final   Report Status 09/13/2021 FINAL  Final  Blood Culture ID Panel (Reflexed)     Status: Abnormal   Collection Time: 09/12/21 11:19 AM  Result Value Ref Range Status   Enterococcus faecalis NOT DETECTED NOT DETECTED Final   Enterococcus Faecium NOT DETECTED NOT DETECTED Final   Listeria monocytogenes NOT DETECTED NOT DETECTED Final   Staphylococcus species NOT DETECTED NOT DETECTED Final   Staphylococcus aureus (BCID) NOT DETECTED NOT DETECTED Final   Staphylococcus epidermidis NOT DETECTED NOT DETECTED Final   Staphylococcus lugdunensis NOT DETECTED NOT DETECTED Final   Streptococcus species NOT DETECTED NOT DETECTED Final   Streptococcus agalactiae NOT DETECTED NOT DETECTED Final   Streptococcus pneumoniae NOT DETECTED NOT DETECTED Final   Streptococcus pyogenes NOT DETECTED NOT DETECTED Final   A.calcoaceticus-baumannii NOT DETECTED NOT DETECTED Final   Bacteroides fragilis NOT DETECTED NOT DETECTED Final   Enterobacterales DETECTED (A) NOT DETECTED Final    Comment: Enterobacterales represent a large order of gram negative bacteria, not a single organism. CRITICAL RESULT CALLED TO, READ BACK BY AND VERIFIED WITH: JASON ROBBINS AT 0500 09/13/21.PMF    Enterobacter cloacae complex NOT DETECTED NOT DETECTED Final   Escherichia coli DETECTED (A) NOT DETECTED Final    Comment: CRITICAL RESULT CALLED TO, READ BACK BY AND VERIFIED WITH: JASON ROBBINS AT 0500 09/13/21.PMF    Klebsiella aerogenes NOT DETECTED NOT DETECTED Final   Klebsiella oxytoca NOT DETECTED NOT DETECTED Final   Klebsiella pneumoniae NOT DETECTED NOT DETECTED Final   Proteus species NOT DETECTED NOT  DETECTED Final   Salmonella species NOT DETECTED NOT DETECTED Final   Serratia marcescens NOT DETECTED NOT DETECTED Final   Haemophilus influenzae NOT DETECTED NOT DETECTED Final   Neisseria meningitidis NOT DETECTED NOT DETECTED Final   Pseudomonas aeruginosa NOT DETECTED NOT DETECTED Final   Stenotrophomonas maltophilia NOT DETECTED NOT DETECTED Final   Candida  albicans NOT DETECTED NOT DETECTED Final   Candida auris NOT DETECTED NOT DETECTED Final   Candida glabrata NOT DETECTED NOT DETECTED Final   Candida krusei NOT DETECTED NOT DETECTED Final   Candida parapsilosis NOT DETECTED NOT DETECTED Final   Candida tropicalis NOT DETECTED NOT DETECTED Final   Cryptococcus neoformans/gattii NOT DETECTED NOT DETECTED Final   CTX-M ESBL NOT DETECTED NOT DETECTED Final   Carbapenem resistance IMP NOT DETECTED NOT DETECTED Final   Carbapenem resistance KPC NOT DETECTED NOT DETECTED Final   Carbapenem resistance NDM NOT DETECTED NOT DETECTED Final   Carbapenem resist OXA 48 LIKE NOT DETECTED NOT DETECTED Final   Carbapenem resistance VIM NOT DETECTED NOT DETECTED Final    Comment: Performed at [facility], [address] [place], [place] [address]   *Note: Due to a large number of results and/or encounters for the requested time period, some results have not been displayed. A complete set of results can be found in Results Review.    Coagulation Studies: No results for input(s): LABPROT, INR in the last 72 hours.   Urinalysis: Recent Labs    09/12/21 1119  COLORURINE AMBER*  LABSPEC 1.020  PHURINE 5.0  GLUCOSEU NEGATIVE  HGBUR MODERATE*  BILIRUBINUR NEGATIVE  KETONESUR NEGATIVE  PROTEINUR 100*  NITRITE NEGATIVE  LEUKOCYTESUR TRACE*       Imaging: No results found.   Medications:    cefTRIAXone (ROCEPHIN)  IV Stopped (09/15/21 0930)    ALPRAZolam  1 mg Oral QHS   apixaban  2.5 mg Oral BID   Chlorhexidine Gluconate Cloth  6 each Topical Daily   feeding  supplement  1 Container Oral TID BM   fluticasone  2 spray Each Nare BID   folic acid  0,932 mcg Oral Daily   gabapentin  300 mg Oral BID AC & HS   hydroxychloroquine  200 mg Oral BID   metoprolol succinate  12.5 mg Oral QHS   mirabegron ER  50 mg Oral Daily   pantoprazole  40 mg Oral Daily   polyethylene glycol  17 g Oral QHS   pravastatin  20 mg Oral QHS   vitamin B-12  1,000 mcg Oral Daily   acetaminophen **OR** acetaminophen, chlorhexidine, loratadine, ondansetron **OR** ondansetron (ZOFRAN) IV  Assessment/ Plan:  [name] is a 87 y.o.  male with a past medical history of chronic diastolic heart failure, rheumatoid arthritis, chronic hyponatremia, chronic indwelling Foley, and chronic kidney disease.  Patient presents to the emergency room with complaints of progressive fatigue.  He has been admitted for AKI (acute kidney injury) ([place]) [N17.9] Acute kidney injury ([place]) [N17.9] Hypotension, unspecified hypotension type [I95.9]   Acute Kidney Injury on chronic kidney disease stage 3a with baseline creatinine 1.2 and GFR of 58 on 08/10/21.  Acute kidney injury secondary to believed overdiuresis with poor appetite Chronic kidney disease is secondary to right nephrectomy No IV contrast exposure.  No indication for dialysis at this time.  Avoid nephrotoxic therapies.  Creatinine has returned to baseline. Continue to hold diuretics and consider restarting outpatient.  Lab Results  Component Value Date   CREATININE 1.19 09/15/2021   CREATININE 1.66 (H) 09/14/2021   CREATININE 2.03 (H) 09/13/2021    Intake/Output Summary (Last 24 hours) at 09/15/2021 1115 Last data filed at 09/15/2021 0930 Gross per 24 hour  Intake 300 ml  Output 1100 ml  Net -800 ml    2.  Chronic diastolic heart failure: Echo from June 2021 shows EF  of 60 to 65%.    3.  E. coli bacteremia: Receiving Rocephin   LOS: 4   10/19/202211:15 AM

## 2021-09-15 NOTE — Progress Notes (Signed)
Occupational Therapy Treatment Patient Details Name: [name] MRN: [id number] DOB: Jun 10, 1934 Today's Date: 09/15/2021   History of present illness [name]  is a 87 y.o. male with a known history of chronic diastolic congestive heart failure, indwelling Foley catheter, rheumatoid arthritis, chronic hyponatremia, chronic kidney disease. He has been exhausted lately and having trouble sleeping, and has been weak and struggling to get up and down. In the ER he was hypotensive with a blood pressure of 94/37 on 1 occasion.  He was dehydrated with a BUN of 36 and a creatinine up to 2.28.   OT comments  Pt seen for OT treatment on this date. Functional mobility portion of session coordinated with PT for patient/therapist safety. Upon arrival to room, pt awake and seated upright in bed. Pt agreeable to OT tx and verbalized goal of being able to use BSC. Pt currently presents with decreased strength and balance. Due to these functional impairments, pt requires MAX A for bed mobility, MOD A for seated UB dressing, MAX A for seated LB dressing, and MOD A+2 to take lateral steps at EOB (see PT note for more further information on assistance required for OOB mobility). Pt continues to benefit from skilled OT services to maximize return to PLOF and minimize risk of future falls, injury, caregiver burden, and readmission. Will continue to follow POC. Discharge recommendation remains appropriate.     Recommendations for follow up therapy are one component of a multi-disciplinary discharge planning process, led by the attending physician.  Recommendations may be updated based on patient status, additional functional criteria and insurance authorization.    Follow Up Recommendations  SNF    Equipment Recommendations  Other (comment) (defer to next venue of care)       Precautions / Restrictions Precautions Precautions: Fall Restrictions Weight Bearing Restrictions: No       Mobility Bed  Mobility Overal bed mobility: Needs Assistance Bed Mobility: Supine to Sit;Sit to Supine     Supine to sit: Max assist Sit to supine: Max assist;+2 for physical assistance   General bed mobility comments: Pt unable to use bed rails 2/2 limited B shoulder ROM.    Transfers Overall transfer level: Needs assistance Equipment used: Rolling walker (2 wheeled) Transfers: Sit to/from Stand Sit to Stand: Mod assist;+2 safety/equipment;From elevated surface         General transfer comment: x2 sit<>stand bouts with EOB significantly elevated. Pt elects to pull on RW with BUE vs pushing to stand despite cuing/education on proper/safe hand placement    Balance Overall balance assessment: Needs assistance Sitting-balance support: Feet supported;Bilateral upper extremity supported Sitting balance-[name]: Fair Sitting balance - Comments: supervision for seated ADLs at EOB   Standing balance support: Bilateral upper extremity supported;During functional activity Standing balance-[name]: Poor Standing balance comment: Posterior lean, assistance to move RW forward & manual facilitation to weight shift forward, then pt with with trunk flexion & forward lean onto RW, maintains BLE knee & hip flexion and unable to shift pelvis anteriorly for full upright posture.                           ADL either performed or assessed with clinical judgement   ADL Overall ADL's : Needs assistance/impaired                 Upper Body Dressing : Moderate assistance;Sitting Upper Body Dressing Details (indicate cue type and reason): To don posterior gown. MOD A  in setting of limited AROM of b/l shoulders Lower Body Dressing: Maximal assistance;Sitting/lateral leans Lower Body Dressing Details (indicate cue type and reason): to don socks while seated EOB             Functional mobility during ADLs: Moderate assistance;+2 for physical assistance;Rolling walker         Cognition Arousal/Alertness: Awake/alert Behavior During Therapy: WFL for tasks assessed/performed                                   General Comments: Pleasant, demonstrates some working memory deficits as pt forgets what he's talking about mid conversation, tangential conversation                   Pertinent Vitals/ Pain       Pain Assessment: No/denies pain         Frequency  Min 1X/week        Progress Toward Goals  OT Goals(current goals can now be found in the care plan section)  Progress towards OT goals: Progressing toward goals  Acute Rehab OT Goals Patient Stated Goal: to return home with family OT Goal Formulation: With patient Time For Goal Achievement: 09/26/21 Potential to Achieve Goals: [place] Discharge plan remains appropriate;Frequency remains appropriate    Co-evaluation    PT/OT/SLP Co-Evaluation/Treatment: Yes Reason for Co-Treatment: For patient/therapist safety;To address functional/ADL transfers PT goals addressed during session: Mobility/safety with mobility;Proper use of DME OT goals addressed during session: ADL's and self-care      AM-PAC OT "6 Clicks" Daily Activity     Outcome Measure   Help from another person eating meals?: None Help from another person taking care of personal grooming?: A Lot Help from another person toileting, which includes using toliet, bedpan, or urinal?: A Lot Help from another person bathing (including washing, rinsing, drying)?: A Lot Help from another person to put on and taking off regular upper body clothing?: A Lot Help from another person to put on and taking off regular lower body clothing?: A Lot 6 Click Score: 14    End of Session Equipment Utilized During Treatment: Gait belt;Rolling walker  OT Visit Diagnosis: Muscle weakness (generalized) (M62.81);Unsteadiness on feet (R26.81)   Activity Tolerance Patient tolerated treatment well   Patient Left in bed;with call  bell/phone within reach;with bed alarm set;with family/visitor present   Nurse Communication Mobility status        Time: 7322-0254 OT Time Calculation (min): 37 min  Charges: OT General Charges $OT Visit: 1 Visit OT Treatments $Self Care/Home Management : 8-22 mins  Fredirick Maudlin, OTR/L [place]

## 2021-09-15 NOTE — Progress Notes (Signed)
Physical Therapy Treatment Patient Details Name: [name] MRN: [id number] DOB: 1934/06/22 Today's Date: 09/15/2021   History of Present Illness [name]  is a 87 y.o. male with a known history of chronic diastolic congestive heart failure, indwelling Foley catheter, rheumatoid arthritis, chronic hyponatremia, chronic kidney disease. He has been exhausted lately and having trouble sleeping, and has been weak and struggling to get up and down. In the ER he was hypotensive with a blood pressure of 94/37 on 1 occasion.  He was dehydrated with a BUN of 36 and a creatinine up to 2.28.    PT Comments    Pt seen this pm with OT in order to provide increased safety with transfers/mobility for pt and staff.  Pt continues to require MaxA for bed mobility. Good tolerance for sitting EOB with supervision.  Pt required ModA of 1 to stand at [place] from raised bed. Initially pt resisted due to fear of falling, once comfortable with technique pt able to provide more help with transfer. Side stepping with RW with flexed forward posture and Max cues. Pt will benefit from SNF upon d/c   Recommendations for follow up therapy are one component of a multi-disciplinary discharge planning process, led by the attending physician.  Recommendations may be updated based on patient status, additional functional criteria and insurance authorization.  Follow Up Recommendations  SNF     Equipment Recommendations  None recommended by PT    Recommendations for Other Services       Precautions / Restrictions Precautions Precautions: Fall Restrictions Weight Bearing Restrictions: No     Mobility  Bed Mobility Overal bed mobility: Needs Assistance Bed Mobility: Supine to Sit;Sit to Supine     Supine to sit: Max assist Sit to supine: Max assist;+2 for physical assistance   General bed mobility comments: Pt unable to use bed rails 2/2 limited B shoulder ROM.    Transfers Overall transfer level: Needs  assistance Equipment used: Rolling walker (2 wheeled) Transfers: Sit to/from Stand Sit to Stand: Mod assist;+2 safety/equipment;From elevated surface         General transfer comment: Pt tolerated standing twice at RW with flexed fwd posute and flexed knees, repeated vc's to attain upright standing  Ambulation/Gait                 Stairs             Wheelchair Mobility    Modified Rankin (Stroke Patients Only)       Balance Overall balance assessment: Needs assistance Sitting-balance support: Feet supported;Bilateral upper extremity supported Sitting balance-[name]: Fair Sitting balance - Comments: supervision for seated ADLs at EOB   Standing balance support: Bilateral upper extremity supported;During functional activity Standing balance-[name]: Poor Standing balance comment: Posterior lean, assistance to move RW forward & manual facilitation to weight shift forward, then pt with with trunk flexion & forward lean onto RW, maintains BLE knee & hip flexion and unable to shift pelvis anteriorly for full upright posture.                            Cognition Arousal/Alertness: Awake/alert Behavior During Therapy: WFL for tasks assessed/performed Overall Cognitive Status: Within Functional Limits for tasks assessed                                 General Comments: Very chatty, needs cues to  stay on task. Also with anxiety and fear of falling      Exercises      General Comments General comments (skin integrity, edema, etc.): Pt educated on benefits of increasing mobility and strength      Pertinent Vitals/Pain Pain Assessment: No/denies pain    Home Living                      Prior Function            PT Goals (current goals can now be found in the care plan section) Acute Rehab PT Goals Patient Stated Goal: to return home with family    Frequency    Min 2X/week      PT Plan Current plan  remains appropriate    Co-evaluation   Reason for Co-Treatment: For patient/therapist safety PT goals addressed during session: Mobility/safety with mobility;Balance OT goals addressed during session: ADL's and self-care      AM-PAC PT "6 Clicks" Mobility   Outcome Measure  Help needed turning from your back to your side while in a flat bed without using bedrails?: A Lot Help needed moving from lying on your back to sitting on the side of a flat bed without using bedrails?: Total Help needed moving to and from a bed to a chair (including a wheelchair)?: Total Help needed standing up from a chair using your arms (e.g., wheelchair or bedside chair)?: Total Help needed to walk in hospital room?: Total Help needed climbing 3-5 steps with a railing? : Total 6 Click Score: 7    End of Session Equipment Utilized During Treatment: Gait belt Activity Tolerance: Patient tolerated treatment well Patient left: in bed;with call bell/phone within reach;with bed alarm set Nurse Communication: Mobility status PT Visit Diagnosis: Unsteadiness on feet (R26.81);Other abnormalities of gait and mobility (R26.89);Muscle weakness (generalized) (M62.81)     Time: 8101-7510 PT Time Calculation (min) (ACUTE ONLY): 10 min  Charges:  $Therapeutic Activity: 8-22 mins                    Mikel Cella, PTA    Josie Dixon 09/15/2021, 5:26 PM

## 2021-09-16 DIAGNOSIS — R7881 Bacteremia: Secondary | ICD-10-CM | POA: Diagnosis not present

## 2021-09-16 DIAGNOSIS — N179 Acute kidney failure, unspecified: Secondary | ICD-10-CM | POA: Diagnosis not present

## 2021-09-16 DIAGNOSIS — D61818 Other pancytopenia: Secondary | ICD-10-CM | POA: Diagnosis not present

## 2021-09-16 DIAGNOSIS — I480 Paroxysmal atrial fibrillation: Secondary | ICD-10-CM | POA: Diagnosis not present

## 2021-09-16 LAB — COMPREHENSIVE METABOLIC PANEL
ALT: 23 U/L (ref 0–44)
AST: 26 U/L (ref 15–41)
Albumin: 2.4 g/dL — ABNORMAL LOW (ref 3.5–5.0)
Alkaline Phosphatase: 40 U/L (ref 38–126)
Anion gap: 9 (ref 5–15)
BUN: 21 mg/dL (ref 8–23)
CO2: 26 mmol/L (ref 22–32)
Calcium: 7.9 mg/dL — ABNORMAL LOW (ref 8.9–10.3)
Chloride: 99 mmol/L (ref 98–111)
Creatinine, Ser: 1.05 mg/dL (ref 0.61–1.24)
GFR, Estimated: >60 mL/min (ref >60)
Glucose, Bld: 100 mg/dL — ABNORMAL HIGH (ref 70–99)
Potassium: 3.2 mmol/L — ABNORMAL LOW (ref 3.5–5.1)
Sodium: 134 mmol/L — ABNORMAL LOW (ref 135–145)
Total Bilirubin: 0.5 mg/dL (ref 0.3–1.2)
Total Protein: 4.9 g/dL — ABNORMAL LOW (ref 6.5–8.1)

## 2021-09-16 LAB — CBC WITH DIFFERENTIAL/PLATELET
Abs Immature Granulocytes: 0.06 10*3/uL (ref 0.00–0.07)
Basophils Absolute: 0 10*3/uL (ref 0.0–0.1)
Basophils Relative: 1 %
Eosinophils Absolute: 0.2 10*3/uL (ref 0.0–0.5)
Eosinophils Relative: 5 %
HCT: 22.8 % — ABNORMAL LOW (ref 39.0–52.0)
Hemoglobin: 8.1 g/dL — ABNORMAL LOW (ref 13.0–17.0)
Immature Granulocytes: 2 %
Lymphocytes Relative: 18 %
Lymphs Abs: 0.7 10*3/uL (ref 0.7–4.0)
MCH: 35.2 pg — ABNORMAL HIGH (ref 26.0–34.0)
MCHC: 35.5 g/dL (ref 30.0–36.0)
MCV: 99.1 fL (ref 80.0–100.0)
Monocytes Absolute: 0.5 10*3/uL (ref 0.1–1.0)
Monocytes Relative: 14 %
Neutro Abs: 2.2 10*3/uL (ref 1.7–7.7)
Neutrophils Relative %: 60 %
Platelets: 73 10*3/uL — ABNORMAL LOW (ref 150–400)
RBC: 2.3 MIL/uL — ABNORMAL LOW (ref 4.22–5.81)
RDW: 14.7 % (ref 11.5–15.5)
WBC: 3.7 10*3/uL — ABNORMAL LOW (ref 4.0–10.5)
nRBC: 0 % (ref 0.0–0.2)

## 2021-09-16 LAB — MAGNESIUM: Magnesium: 2.1 mg/dL (ref 1.7–2.4)

## 2021-09-16 LAB — PHOSPHORUS: Phosphorus: 2.6 mg/dL (ref 2.5–4.6)

## 2021-09-16 MED ORDER — POTASSIUM CHLORIDE CRYS ER 20 MEQ PO TBCR
40.0000 meq | EXTENDED_RELEASE_TABLET | Freq: Two times a day (BID) | ORAL | Status: AC
  Administered 2021-09-16 (×2): 40 meq via ORAL
  Filled 2021-09-16 (×2): qty 2

## 2021-09-16 NOTE — Progress Notes (Signed)
PROGRESS NOTE    [name]  VQQ:[id number] DOB: 08-07-1934 DOA: 09/11/2021 PCP: [name], MD   Brief Narrative:  The patient is an 87 year old Caucasian male with a past medical history significant for 2 right renal nephrectomy given renal mass, chronic hyponatremia, chronic indwelling Foley catheter, chronic diastolic CHF, essential hypertension, persistent atrial fibrillation as well as other comorbidities who presented with hypotension initially.  Initial urinalysis showed blood but nitrates and leuk esterase was negative.  Initially antibiotics were held off but he spiked a temperature of 103 and blood cultures were drawn and E. coli was shown growing out of 1 out of 4 bottles.  His Foley catheter was changed out by urology and he is placed on IV ceftriaxone.  Nephrology been following given his AKI on admission and his IV fluids have now been stopped.  The E. coli bacteremia sensitivities came back and he is sensitive to cefazolin, Zosyn, imipenem, ciprofloxacin, and ceftriaxone which we will continue while hospitalized and change to po Levofloxacin at D/C.  We will obtain PT OT for further evaluate and treat and they are recommending SNF  Assessment & Plan:   Active Problems:   Fever   AF (paroxysmal atrial fibrillation) (HCC)   Pancytopenia (HCC)   Acute kidney injury superimposed on CKD (HCC)   E coli bacteremia  E. coli bacteremia -Had a episode of hypotension when he came in and then spiked a temperature of 103 on 09/13/2019 -And a temperature of 12:101 the night before.  He is continuing on IV ceftriaxone and sensitivities showing that he is sensitive to cefazolin, Zosyn, imipenem, ciprofloxacin and ceftriaxone. -Foley catheter was changed -Urine culture showed multiple organisms -Continue empiric antibiotics with IV Ceftriaxone and monitor clinical response to intervention; Change to po Levofloxacin at D/C  -We will message ID for the gram-negative bacteremia pilot  recommendations -PT OT evaluated and recommending SNF and awaiting placement and Insurance Authorization  AKI on CKD stage IIIa -Improving as patient's BUN/creatinine went from 36/2.2 and now trending down to 29/1.19 -> 21/1.05 -Baseline creatinine is around 1.1-1.3 and in the setting of his right renal nephrectomy -Avoid further nephrotoxic medications, contrast dyes, hypotension renally dose medications -Repeat CMP in the a.m. and nephrology was consulted and recommending no further IV fluids -Follow up with Nephrology within 1 week   Hyponatremia -Mild.  Patient sodium is now 134 -Continue to monitor and trend and repeat CMP in a.m.  Hypokalemia -Patient's potassium is now 3.2 -Mag level was 2.0 -Replete with p.o. KCl 40 mEQ -Continue monitor replete as necessary -Repeat CMP in a.m.  Macrocytic anemia/anemia of chronic disease -Patient's BUN/creatinine went from 11.1/33.3 and likely hemoconcentrated on admission and trended down to 7.9/22.6 and today is 8.1/22.8 -Continue to monitor for signs and symptoms of bleeding; currently no overt bleeding noted -Check anemia panel in a.m. -Repeat CBC in a.m.  Thrombocytopenia -Likely in the setting of infection as above -Patient's platelet count went from 82 and trended down to 52 and now is 58 -> 73 -Continue monitor for signs of bleeding; currently no overt bleeding noted -Repeat CBC in a.m.  Paroxysmal atrial fibrillation -On low-dose Eliquis at 2.5 mg p.o. twice daily -Patient was restarted on his Toprol succinate 12.5 g p.o. daily.  Rheumatoid arthritis -Continue with Hydroxychloroquine 200 mg po BID  Hyperlipidemia -Continue with Pravastatin 20 mg p.o. nightly  Neuropathy -Dose adjusted Gabapentin 300 mg p.o. twice daily  Pancytopenia -Pathology is reviewed and showed giant platelets and no schistocytes -WBC  is 3.7, Hgb/Hct is 8.1/22.8, and Platelet Count is 73 -Continue monitor CBCs and his leukopenia has now  resolved  Chronic Diastolic CHF -Echo in June 2021: EF of 60 to 65% -Nephrology recommending continue to hold diuretics at this time given his AKI in the setting of his overdiuresis with poor appetite -Continue monitor for signs and symptoms of volume overload and will need strict I's and O's and daily weights -Patient is -884.1 Liters   Obesity -Complicates overall prognosis and Care -Estimated body mass index is 30.93 kg/m as calculated from the following:   Height as of 05/28/21: 5\' 9"  (1.753 m).   Weight as of this encounter: 95 kg. -Weight Loss and Dietary Counseling   DVT prophylaxis: SCD he is anticoagulated with apixaban 2.5 mg p.o. twice daily Code Status: FULL CODE Family Communication: No family currently at bedside Disposition Plan: Anticipating discharging to SNF within 24 hours  Status is: Inpatient  Remains inpatient appropriate because: Patient needs treatment of his bacteremia and evaluation of his renal function.  PT OT recommending SNF and will need a safe discharge disposition and bed and insurance authorization before he can be discharged   Consultants:  Nephrology  Procedures: None  Antimicrobials:  Anti-infectives (From admission, onward)    Start     Dose/Rate Route Frequency Ordered Stop   09/13/21 1000  cefTRIAXone (ROCEPHIN) 2 g in sodium chloride 0.9 % 100 mL IVPB        2 g 200 mL/hr over 30 Minutes Intravenous Every 24 hours 09/13/21 0624     09/12/21 1000  piperacillin-tazobactam (ZOSYN) IVPB 3.375 g  Status:  Discontinued        3.375 g 12.5 mL/hr over 240 Minutes Intravenous Every 8 hours 09/12/21 0858 09/13/21 0622   09/11/21 2200  hydroxychloroquine (PLAQUENIL) tablet 200 mg        200 mg Oral 2 times daily 09/11/21 1758          Subjective: Seen and examined at bedside and she is doing fairly well and happy that his kidney functions is improved to his baseline.  His fluid status is stable and nephrology recommends not resuming his  diuretics until outpatient.  He denies any chest pain or shortness breath.  Anticipating going to SNF tomorrow.  Denies any chest pain or other concerns or points at this time  Objective: Vitals:   09/15/21 2331 09/16/21 0506 09/16/21 0749 09/16/21 1158  BP: (!) 160/57 (!) 160/56 (!) 150/50 (!) 114/40  Pulse: 68 77 75 71  Resp: 20 (!) 21 16 (!) 22  Temp: 98 F (36.7 C) 97.9 F (36.6 C) 99.6 F (37.6 C) 97.8 F (36.6 C)  TempSrc:    Oral  SpO2: 96% 95% 95% 95%  Weight:        Intake/Output Summary (Last 24 hours) at 09/16/2021 1305 Last data filed at 09/16/2021 1018 Gross per 24 hour  Intake 240 ml  Output 1100 ml  Net -860 ml    Filed Weights   09/11/21 1219  Weight: 95 kg   Examination: Physical Exam:  Constitutional: WN/WD obese Caucasian male in NAD and appears calm  Eyes: Lids and conjunctivae normal, sclerae anicteric  ENMT: External Ears, Nose appear normal. Grossly normal hearing. Mucous membranes are moist.  Neck: Appears normal, supple, no cervical masses, normal ROM, no appreciable thyromegaly; no JVD Respiratory: Diminished to auscultation bilaterally with coarse breath sounds, no wheezing, rales, rhonchi or crackles. Normal respiratory effort and patient is not tachypenic. No accessory  muscle use. Unlabored breathing  Cardiovascular: RRR, no murmurs / rubs / gallops. S1 and S2 auscultated. Mild 1+ LE Edema Abdomen: Soft, non-tender, Distended 2/2 body habitus. Bowel sounds positive.  GU: Deferred. Musculoskeletal: No clubbing / cyanosis of digits/nails. No joint deformity upper and lower extremities.  Skin: No rashes, lesions, ulcers on a limited skin evaluation. No induration; Warm and dry.  Neurologic: CN 2-12 grossly intact with no focal deficits. Romberg sign and cerebellar reflexes not assessed.  Psychiatric: Normal judgment and insight. Alert and oriented x 3. Normal mood and appropriate affect.   Data Reviewed: I have personally reviewed following labs  and imaging studies  CBC: Recent Labs  Lab 09/11/21 1405 09/12/21 0524 09/13/21 0457 09/14/21 0554 09/15/21 0913 09/16/21 0922  WBC 9.5 5.1 2.7* 3.3* 4.0 3.7*  NEUTROABS 7.7 4.6  --   --  2.8 2.2  HGB 11.1* 9.1* 8.4* 8.3* 7.9* 8.1*  HCT 33.3* 26.9* 24.3* 24.8* 22.6* 22.8*  MCV 103.1* 105.9* 102.1* 102.9* 100.0 99.1  PLT 82* 69* 61* 52* 58* 73*    Basic Metabolic Panel: Recent Labs  Lab 09/12/21 0524 09/13/21 0457 09/14/21 0554 09/15/21 0845 09/15/21 0913 09/16/21 0922  NA 134* 131* 134* 134*  --  134*  K 4.1 3.8 3.7 3.3*  --  3.2*  CL 95* 97* 98 99  --  99  CO2 30 28 26 25   --  26  GLUCOSE 100* 105* 102* 104*  --  100*  BUN 37* 39* 37* 29*  --  21  CREATININE 2.11* 2.03* 1.66* 1.19  --  1.05  CALCIUM 8.2* 7.6* 7.7* 7.8*  --  7.9*  MG  --   --   --   --  2.0 2.1  PHOS  --   --   --  2.6  --  2.6    GFR: Estimated Creatinine Clearance: 58.5 mL/min (by C-G formula based on SCr of 1.05 mg/dL). Liver Function Tests: Recent Labs  Lab 09/11/21 1405 09/15/21 0845 09/15/21 0913 09/16/21 0922  AST 46*  --  26 26  ALT 32  --  20 23  ALKPHOS 48  --  38 40  BILITOT 1.0  --  0.5 0.5  PROT 6.6  --  4.8* 4.9*  ALBUMIN 3.6 2.3* 2.3* 2.4*    No results for input(s): LIPASE, AMYLASE in the last 168 hours. No results for input(s): AMMONIA in the last 168 hours. Coagulation Profile: Recent Labs  Lab 09/12/21 0524  INR 1.9*    Cardiac Enzymes: No results for input(s): CKTOTAL, CKMB, CKMBINDEX, TROPONINI in the last 168 hours. BNP (last 3 results) No results for input(s): PROBNP in the last 8760 hours. HbA1C: No results for input(s): HGBA1C in the last 72 hours. CBG: No results for input(s): GLUCAP in the last 168 hours. Lipid Profile: No results for input(s): CHOL, HDL, LDLCALC, TRIG, CHOLHDL, LDLDIRECT in the last 72 hours. Thyroid Function Tests: No results for input(s): TSH, T4TOTAL, FREET4, T3FREE, THYROIDAB in the last 72 hours. Anemia Panel: No results  for input(s): VITAMINB12, FOLATE, FERRITIN, TIBC, IRON, RETICCTPCT in the last 72 hours. Sepsis Labs: Recent Labs  Lab 09/11/21 1837  LATICACIDVEN 1.6     Recent Results (from the past 240 hour(s))  Resp Panel by RT-PCR (Flu A&B, Covid) Urine, Catheterized     Status: None   Collection Time: 09/11/21  3:41 PM   Specimen: Urine, Catheterized; Nasopharyngeal(NP) swabs in vial transport medium  Result Value Ref Range Status  SARS Coronavirus 2 by RT PCR NEGATIVE NEGATIVE Final    Comment: (NOTE) SARS-CoV-2 target nucleic acids are NOT DETECTED.  The SARS-CoV-2 RNA is generally detectable in upper respiratory specimens during the acute phase of infection. The lowest concentration of SARS-CoV-2 viral copies this assay can detect is 138 copies/mL. A negative result does not preclude SARS-Cov-2 infection and should not be used as the sole basis for treatment or other patient management decisions. A negative result may occur with  improper specimen collection/handling, submission of specimen other than nasopharyngeal swab, presence of viral mutation(s) within the areas targeted by this assay, and inadequate number of viral copies(<138 copies/mL). A negative result must be combined with clinical observations, patient history, and epidemiological information. The expected result is Negative.  Fact Sheet for Patients:  EntrepreneurPulse.com.au  Fact Sheet for Healthcare Providers:  IncredibleEmployment.be  This test is no t yet approved or cleared by the [place] FDA and  has been authorized for detection and/or diagnosis of SARS-CoV-2 by FDA under an Emergency Use Authorization (EUA). This EUA will remain  in effect (meaning this test can be used) for the duration of the COVID-19 declaration under Section 564(b)(1) of the Act, 21 U.S.C.section 360bbb-3(b)(1), unless the authorization is terminated  or revoked sooner.       Influenza A by  PCR NEGATIVE NEGATIVE Final   Influenza B by PCR NEGATIVE NEGATIVE Final    Comment: (NOTE) The Xpert Xpress SARS-CoV-2/FLU/RSV plus assay is intended as an aid in the diagnosis of influenza from Nasopharyngeal swab specimens and should not be used as a sole basis for treatment. Nasal washings and aspirates are unacceptable for Xpert Xpress SARS-CoV-2/FLU/RSV testing.  Fact Sheet for Patients: EntrepreneurPulse.com.au  Fact Sheet for Healthcare Providers: IncredibleEmployment.be  This test is not yet approved or cleared by the [place] FDA and has been authorized for detection and/or diagnosis of SARS-CoV-2 by FDA under an Emergency Use Authorization (EUA). This EUA will remain in effect (meaning this test can be used) for the duration of the COVID-19 declaration under Section 564(b)(1) of the Act, 21 U.S.C. section 360bbb-3(b)(1), unless the authorization is terminated or revoked.  Performed at [facility], [address] [place], [place] [address]   CULTURE, BLOOD (ROUTINE X 2) w Reflex to ID Panel     Status: Abnormal   Collection Time: 09/12/21 11:19 AM   Specimen: BLOOD  Result Value Ref Range Status   Specimen Description   Final    BLOOD LEFT HABD Performed at [facility], [address], [place], [place] [address]    Special Requests   Final    BOTTLES DRAWN AEROBIC AND ANAEROBIC Blood Culture adequate volume Performed at [facility], [address] [place], [place] [address]    Culture  Setup Time   Final    GRAM NEGATIVE RODS ANAEROBIC BOTTLE ONLY CRITICAL RESULT CALLED TO, READ BACK BY AND VERIFIED WITH: JASON ROBBINS AT 0500 09/13/21.PMF Performed at [facility], [place] [address], [place],  [address]    Culture ESCHERICHIA COLI (A)  Final   Report Status 09/15/2021 FINAL  Final   Organism ID, Bacteria ESCHERICHIA COLI  Final      Susceptibility   Escherichia coli - MIC*     AMPICILLIN >=32 RESISTANT Resistant     CEFAZOLIN 8 SENSITIVE Sensitive     CEFEPIME <=0.12 SENSITIVE Sensitive     CEFTAZIDIME <=1 SENSITIVE Sensitive     CEFTRIAXONE <=0.25 SENSITIVE Sensitive  CIPROFLOXACIN <=0.25 SENSITIVE Sensitive     GENTAMICIN >=16 RESISTANT Resistant     IMIPENEM <=0.25 SENSITIVE Sensitive     TRIMETH/SULFA >=320 RESISTANT Resistant     AMPICILLIN/SULBACTAM >=32 RESISTANT Resistant     PIP/TAZO <=4 SENSITIVE Sensitive     * ESCHERICHIA COLI  CULTURE, BLOOD (ROUTINE X 2) w Reflex to ID Panel     Status: None (Preliminary result)   Collection Time: 09/12/21 11:19 AM   Specimen: BLOOD  Result Value Ref Range Status   Specimen Description BLOOD RIGHT HAND  Final   Special Requests   Final    BOTTLES DRAWN AEROBIC AND ANAEROBIC Blood Culture adequate volume   Culture   Final    NO GROWTH 4 DAYS Performed at [facility], [address] [place], [place] [address]    Report Status PENDING  Incomplete  Urine Culture     Status: Abnormal   Collection Time: 09/12/21 11:19 AM   Specimen: Urine, Random  Result Value Ref Range Status   Specimen Description   Final    URINE, RANDOM Performed at [facility], [address] [place], [place] [address]    Special Requests   Final    Normal Performed at [facility], [address] [place], [place] [address]    Culture MULTIPLE SPECIES PRESENT, SUGGEST RECOLLECTION (A)  Final   Report Status 09/13/2021 FINAL  Final  Blood Culture ID Panel (Reflexed)     Status: Abnormal   Collection Time: 09/12/21 11:19 AM  Result Value Ref Range Status   Enterococcus faecalis NOT DETECTED NOT DETECTED Final   Enterococcus Faecium NOT DETECTED NOT DETECTED Final   Listeria monocytogenes NOT DETECTED NOT DETECTED Final   Staphylococcus species NOT DETECTED NOT DETECTED Final   Staphylococcus aureus (BCID) NOT DETECTED NOT DETECTED Final   Staphylococcus epidermidis NOT DETECTED NOT DETECTED  Final   Staphylococcus lugdunensis NOT DETECTED NOT DETECTED Final   Streptococcus species NOT DETECTED NOT DETECTED Final   Streptococcus agalactiae NOT DETECTED NOT DETECTED Final   Streptococcus pneumoniae NOT DETECTED NOT DETECTED Final   Streptococcus pyogenes NOT DETECTED NOT DETECTED Final   A.calcoaceticus-baumannii NOT DETECTED NOT DETECTED Final   Bacteroides fragilis NOT DETECTED NOT DETECTED Final   Enterobacterales DETECTED (A) NOT DETECTED Final    Comment: Enterobacterales represent a large order of gram negative bacteria, not a single organism. CRITICAL RESULT CALLED TO, READ BACK BY AND VERIFIED WITH: JASON ROBBINS AT 0500 09/13/21.PMF    Enterobacter cloacae complex NOT DETECTED NOT DETECTED Final   Escherichia coli DETECTED (A) NOT DETECTED Final    Comment: CRITICAL RESULT CALLED TO, READ BACK BY AND VERIFIED WITH: JASON ROBBINS AT 0500 09/13/21.PMF    Klebsiella aerogenes NOT DETECTED NOT DETECTED Final   Klebsiella oxytoca NOT DETECTED NOT DETECTED Final   Klebsiella pneumoniae NOT DETECTED NOT DETECTED Final   Proteus species NOT DETECTED NOT DETECTED Final   Salmonella species NOT DETECTED NOT DETECTED Final   Serratia marcescens NOT DETECTED NOT DETECTED Final   Haemophilus influenzae NOT DETECTED NOT DETECTED Final   Neisseria meningitidis NOT DETECTED NOT DETECTED Final   Pseudomonas aeruginosa NOT DETECTED NOT DETECTED Final   Stenotrophomonas maltophilia NOT DETECTED NOT DETECTED Final   Candida albicans NOT DETECTED NOT DETECTED Final   Candida auris NOT DETECTED NOT DETECTED Final   Candida glabrata NOT DETECTED NOT DETECTED Final   Candida krusei NOT DETECTED NOT DETECTED Final   Candida parapsilosis NOT DETECTED NOT DETECTED  Final   Candida tropicalis NOT DETECTED NOT DETECTED Final   Cryptococcus neoformans/gattii NOT DETECTED NOT DETECTED Final   CTX-M ESBL NOT DETECTED NOT DETECTED Final   Carbapenem resistance IMP NOT DETECTED NOT DETECTED  Final   Carbapenem resistance KPC NOT DETECTED NOT DETECTED Final   Carbapenem resistance NDM NOT DETECTED NOT DETECTED Final   Carbapenem resist OXA 48 LIKE NOT DETECTED NOT DETECTED Final   Carbapenem resistance VIM NOT DETECTED NOT DETECTED Final    Comment: Performed at [facility], [address] [place], [place] [address]     RN Pressure Injury Documentation:     Estimated body mass index is 30.93 kg/m as calculated from the following:   Height as of 05/28/21: 5\' 9"  (1.753 m).   Weight as of this encounter: 95 kg.  Malnutrition Type:   Malnutrition Characteristics:   Nutrition Interventions:   Radiology Studies: No results found.  Scheduled Meds:  ALPRAZolam  1 mg Oral QHS   apixaban  2.5 mg Oral BID   Chlorhexidine Gluconate Cloth  6 each Topical Daily   feeding supplement  1 Container Oral TID BM   fluticasone  2 spray Each Nare BID   folic acid  7,867 mcg Oral Daily   gabapentin  300 mg Oral BID AC & HS   hydroxychloroquine  200 mg Oral BID   metoprolol succinate  12.5 mg Oral QHS   mirabegron ER  50 mg Oral Daily   pantoprazole  40 mg Oral Daily   polyethylene glycol  17 g Oral QHS   potassium chloride  40 mEq Oral BID   pravastatin  20 mg Oral QHS   vitamin B-12  1,000 mcg Oral Daily   Continuous Infusions:  cefTRIAXone (ROCEPHIN)  IV 2 g (09/16/21 1047)    LOS: 5 days   Kerney Elbe, DO [facility] PAGER is on AMION  If 7PM-7AM, please contact night-coverage [email or web address]

## 2021-09-16 NOTE — TOC Progression Note (Signed)
Transition of Care [facility]) - Progression Note    Patient Details  Name: [name] MRN: [id number] Date of Birth: 1934-04-23  Transition of Care [facility]) CM/SW [place], RN Phone Number: 09/16/2021, 12:15 PM  Clinical Narrative:    [place] confirmed the SNF is [facility], I explained that the admit date will be 10/21 She stated that the auth will be good, facility to call [facility] [phone] when head in bed an they will adjust the start date to 10/21   Expected Discharge Plan: [place] Barriers to Discharge: Continued Medical Work up  Expected Discharge Plan and Services Expected Discharge Plan: [place] arrangements for the past 2 months: Single Family Home                                       Social Determinants of Health (SDOH) Interventions    Readmission Risk Interventions Readmission Risk Prevention Plan 09/12/2021 06/23/2019  Transportation Screening Complete Complete  PCP or Specialist Appt within 3-5 Days - Complete  HRI or [place] - Complete  Social Work Consult for [place] Planning/Counseling - Complete  Palliative Care Screening - Not Applicable  Medication Review Press photographer) Complete Complete  PCP or Specialist appointment within 3-5 days of discharge Complete -  [place] or Home Care Consult Complete -  SW Recovery Care/Counseling Consult Complete -  Palliative Care Screening Not Applicable -  Skilled Nursing Facility Complete -  Some recent data might be hidden

## 2021-09-16 NOTE — Progress Notes (Signed)
[facility]  ROUNDING NOTE   Subjective:   [name] is an 87 year old male with a past medical history of chronic diastolic heart failure, rheumatoid arthritis, chronic hyponatremia, chronic indwelling Foley, and chronic kidney disease.  Patient presents to the emergency room with complaints of progressive fatigue.  He has been admitted for AKI (acute kidney injury) ([place]) [N17.9] Acute kidney injury ([place]) [N17.9] Hypotension, unspecified hypotension type [I95.9]  Patient is followed by our clinic and receives care from Dr. Holley Raring.   Patient seen resting in bed, states he feels weaker today than yesterday.  Unable to feed himself breakfast.  Edema improved bilateral lower extremities.  Creatinine at baseline  Objective:  Vital signs in last 24 hours:  Temp:  [97.5 F (36.4 C)-99.6 F (37.6 C)] 99.6 F (37.6 C) (10/20 0749) Pulse Rate:  [68-77] 75 (10/20 0749) Resp:  [16-21] 16 (10/20 0749) BP: (126-160)/(45-57) 150/50 (10/20 0749) SpO2:  [93 %-96 %] 95 % (10/20 0749)  Weight change:  Filed Weights   09/11/21 1219  Weight: 95 kg    Intake/Output: I/O last 3 completed shifts: In: 540 [P.O.:240; IV Piggyback:300] Out: 1700 [Urine:1700]   Intake/Output this shift:  Total I/O In: -  Out: 200 [Urine:200]  Physical Exam: General: NAD, resting in bed  Head: Normocephalic, atraumatic. Moist oral mucosal membranes  Eyes: Anicteric  Lungs:  Clear to auscultation, normal breathing effort  Heart: Regular rate and rhythm  Abdomen:  Soft, nontender  Extremities: Trace peripheral edema.  Neurologic: Nonfocal, moving all four extremities  Skin: No lesions  GU Chronic Foley     Basic Metabolic Panel: Recent Labs  Lab 09/12/21 0524 09/13/21 0457 09/14/21 0554 09/15/21 0845 09/15/21 0913 09/16/21 0922  NA 134* 131* 134* 134*  --  134*  K 4.1 3.8 3.7 3.3*  --  3.2*  CL 95* 97* 98 99  --  99  CO2 30 28 26 25   --  26  GLUCOSE 100* 105* 102* 104*  --  100*   BUN 37* 39* 37* 29*  --  21  CREATININE 2.11* 2.03* 1.66* 1.19  --  1.05  CALCIUM 8.2* 7.6* 7.7* 7.8*  --  7.9*  MG  --   --   --   --  2.0 2.1  PHOS  --   --   --  2.6  --  2.6     Liver Function Tests: Recent Labs  Lab 09/11/21 1405 09/15/21 0845 09/15/21 0913 09/16/21 0922  AST 46*  --  26 26  ALT 32  --  20 23  ALKPHOS 48  --  38 40  BILITOT 1.0  --  0.5 0.5  PROT 6.6  --  4.8* 4.9*  ALBUMIN 3.6 2.3* 2.3* 2.4*    No results for input(s): LIPASE, AMYLASE in the last 168 hours. No results for input(s): AMMONIA in the last 168 hours.  CBC: Recent Labs  Lab 09/11/21 1405 09/12/21 0524 09/13/21 0457 09/14/21 0554 09/15/21 0913 09/16/21 0922  WBC 9.5 5.1 2.7* 3.3* 4.0 3.7*  NEUTROABS 7.7 4.6  --   --  2.8 2.2  HGB 11.1* 9.1* 8.4* 8.3* 7.9* 8.1*  HCT 33.3* 26.9* 24.3* 24.8* 22.6* 22.8*  MCV 103.1* 105.9* 102.1* 102.9* 100.0 99.1  PLT 82* 69* 61* 52* 58* 73*     Cardiac Enzymes: No results for input(s): CKTOTAL, CKMB, CKMBINDEX, TROPONINI in the last 168 hours.  BNP: Invalid input(s): POCBNP  CBG: No results for input(s): GLUCAP in the  last 168 hours.  Microbiology: Results for orders placed or performed during the hospital encounter of 09/11/21  Resp Panel by RT-PCR (Flu A&B, Covid) Urine, Catheterized     Status: None   Collection Time: 09/11/21  3:41 PM   Specimen: Urine, Catheterized; Nasopharyngeal(NP) swabs in vial transport medium  Result Value Ref Range Status   SARS Coronavirus 2 by RT PCR NEGATIVE NEGATIVE Final    Comment: (NOTE) SARS-CoV-2 target nucleic acids are NOT DETECTED.  The SARS-CoV-2 RNA is generally detectable in upper respiratory specimens during the acute phase of infection. The lowest concentration of SARS-CoV-2 viral copies this assay can detect is 138 copies/mL. A negative result does not preclude SARS-Cov-2 infection and should not be used as the sole basis for treatment or other patient management decisions. A negative  result may occur with  improper specimen collection/handling, submission of specimen other than nasopharyngeal swab, presence of viral mutation(s) within the areas targeted by this assay, and inadequate number of viral copies(<138 copies/mL). A negative result must be combined with clinical observations, patient history, and epidemiological information. The expected result is Negative.  Fact Sheet for Patients:  EntrepreneurPulse.com.au  Fact Sheet for Healthcare Providers:  IncredibleEmployment.be  This test is no t yet approved or cleared by the [place] FDA and  has been authorized for detection and/or diagnosis of SARS-CoV-2 by FDA under an Emergency Use Authorization (EUA). This EUA will remain  in effect (meaning this test can be used) for the duration of the COVID-19 declaration under Section 564(b)(1) of the Act, 21 U.S.C.section 360bbb-3(b)(1), unless the authorization is terminated  or revoked sooner.       Influenza A by PCR NEGATIVE NEGATIVE Final   Influenza B by PCR NEGATIVE NEGATIVE Final    Comment: (NOTE) The Xpert Xpress SARS-CoV-2/FLU/RSV plus assay is intended as an aid in the diagnosis of influenza from Nasopharyngeal swab specimens and should not be used as a sole basis for treatment. Nasal washings and aspirates are unacceptable for Xpert Xpress SARS-CoV-2/FLU/RSV testing.  Fact Sheet for Patients: EntrepreneurPulse.com.au  Fact Sheet for Healthcare Providers: IncredibleEmployment.be  This test is not yet approved or cleared by the [place] FDA and has been authorized for detection and/or diagnosis of SARS-CoV-2 by FDA under an Emergency Use Authorization (EUA). This EUA will remain in effect (meaning this test can be used) for the duration of the COVID-19 declaration under Section 564(b)(1) of the Act, 21 U.S.C. section 360bbb-3(b)(1), unless the authorization is  terminated or revoked.  Performed at [facility], [address] [place], [place] [address]   CULTURE, BLOOD (ROUTINE X 2) w Reflex to ID Panel     Status: Abnormal   Collection Time: 09/12/21 11:19 AM   Specimen: BLOOD  Result Value Ref Range Status   Specimen Description   Final    BLOOD LEFT HABD Performed at [facility], [address] [place], [place] [address]    Special Requests   Final    BOTTLES DRAWN AEROBIC AND ANAEROBIC Blood Culture adequate volume Performed at [facility], [address], [place], [place] [address]    Culture  Setup Time   Final    GRAM NEGATIVE RODS ANAEROBIC BOTTLE ONLY CRITICAL RESULT CALLED TO, READ BACK BY AND VERIFIED WITH: JASON ROBBINS AT 0500 09/13/21.PMF Performed at [facility], [place] [address] [place], [place] [address]    Culture ESCHERICHIA COLI (A)  Final   Report Status 09/15/2021 FINAL  Final  Organism ID, Bacteria ESCHERICHIA COLI  Final      Susceptibility   Escherichia coli - MIC*    AMPICILLIN >=32 RESISTANT Resistant     CEFAZOLIN 8 SENSITIVE Sensitive     CEFEPIME <=0.12 SENSITIVE Sensitive     CEFTAZIDIME <=1 SENSITIVE Sensitive     CEFTRIAXONE <=0.25 SENSITIVE Sensitive     CIPROFLOXACIN <=0.25 SENSITIVE Sensitive     GENTAMICIN >=16 RESISTANT Resistant     IMIPENEM <=0.25 SENSITIVE Sensitive     TRIMETH/SULFA >=320 RESISTANT Resistant     AMPICILLIN/SULBACTAM >=32 RESISTANT Resistant     PIP/TAZO <=4 SENSITIVE Sensitive     * ESCHERICHIA COLI  CULTURE, BLOOD (ROUTINE X 2) w Reflex to ID Panel     Status: None (Preliminary result)   Collection Time: 09/12/21 11:19 AM   Specimen: BLOOD  Result Value Ref Range Status   Specimen Description BLOOD RIGHT HAND  Final   Special Requests   Final    BOTTLES DRAWN AEROBIC AND ANAEROBIC Blood Culture adequate volume   Culture   Final    NO GROWTH 4 DAYS Performed at [facility], [address] [place], [place]  [address]    Report Status PENDING  Incomplete  Urine Culture     Status: Abnormal   Collection Time: 09/12/21 11:19 AM   Specimen: Urine, Random  Result Value Ref Range Status   Specimen Description   Final    URINE, RANDOM Performed at [facility], [address] [place], [place] [address]    Special Requests   Final    Normal Performed at [facility], [address] [place], [place] [address]    Culture MULTIPLE SPECIES PRESENT, SUGGEST RECOLLECTION (A)  Final   Report Status 09/13/2021 FINAL  Final  Blood Culture ID Panel (Reflexed)     Status: Abnormal   Collection Time: 09/12/21 11:19 AM  Result Value Ref Range Status   Enterococcus faecalis NOT DETECTED NOT DETECTED Final   Enterococcus Faecium NOT DETECTED NOT DETECTED Final   Listeria monocytogenes NOT DETECTED NOT DETECTED Final   Staphylococcus species NOT DETECTED NOT DETECTED Final   Staphylococcus aureus (BCID) NOT DETECTED NOT DETECTED Final   Staphylococcus epidermidis NOT DETECTED NOT DETECTED Final   Staphylococcus lugdunensis NOT DETECTED NOT DETECTED Final   Streptococcus species NOT DETECTED NOT DETECTED Final   Streptococcus agalactiae NOT DETECTED NOT DETECTED Final   Streptococcus pneumoniae NOT DETECTED NOT DETECTED Final   Streptococcus pyogenes NOT DETECTED NOT DETECTED Final   A.calcoaceticus-baumannii NOT DETECTED NOT DETECTED Final   Bacteroides fragilis NOT DETECTED NOT DETECTED Final   Enterobacterales DETECTED (A) NOT DETECTED Final    Comment: Enterobacterales represent a large order of gram negative bacteria, not a single organism. CRITICAL RESULT CALLED TO, READ BACK BY AND VERIFIED WITH: JASON ROBBINS AT 0500 09/13/21.PMF    Enterobacter cloacae complex NOT DETECTED NOT DETECTED Final   Escherichia coli DETECTED (A) NOT DETECTED Final    Comment: CRITICAL RESULT CALLED TO, READ BACK BY AND VERIFIED WITH: JASON ROBBINS AT 0500 09/13/21.PMF    Klebsiella aerogenes NOT  DETECTED NOT DETECTED Final   Klebsiella oxytoca NOT DETECTED NOT DETECTED Final   Klebsiella pneumoniae NOT DETECTED NOT DETECTED Final   Proteus species NOT DETECTED NOT DETECTED Final   Salmonella species NOT DETECTED NOT DETECTED Final   Serratia marcescens NOT DETECTED NOT DETECTED Final   Haemophilus influenzae NOT DETECTED NOT DETECTED Final   Neisseria meningitidis NOT DETECTED NOT DETECTED  Final   Pseudomonas aeruginosa NOT DETECTED NOT DETECTED Final   Stenotrophomonas maltophilia NOT DETECTED NOT DETECTED Final   Candida albicans NOT DETECTED NOT DETECTED Final   Candida auris NOT DETECTED NOT DETECTED Final   Candida glabrata NOT DETECTED NOT DETECTED Final   Candida krusei NOT DETECTED NOT DETECTED Final   Candida parapsilosis NOT DETECTED NOT DETECTED Final   Candida tropicalis NOT DETECTED NOT DETECTED Final   Cryptococcus neoformans/gattii NOT DETECTED NOT DETECTED Final   CTX-M ESBL NOT DETECTED NOT DETECTED Final   Carbapenem resistance IMP NOT DETECTED NOT DETECTED Final   Carbapenem resistance KPC NOT DETECTED NOT DETECTED Final   Carbapenem resistance NDM NOT DETECTED NOT DETECTED Final   Carbapenem resist OXA 48 LIKE NOT DETECTED NOT DETECTED Final   Carbapenem resistance VIM NOT DETECTED NOT DETECTED Final    Comment: Performed at [facility], [address] [place], [place] [address]   *Note: Due to a large number of results and/or encounters for the requested time period, some results have not been displayed. A complete set of results can be found in Results Review.    Coagulation Studies: No results for input(s): LABPROT, INR in the last 72 hours.   Urinalysis: No results for input(s): COLORURINE, LABSPEC, PHURINE, GLUCOSEU, HGBUR, BILIRUBINUR, KETONESUR, PROTEINUR, UROBILINOGEN, NITRITE, LEUKOCYTESUR in the last 72 hours.  Invalid input(s): APPERANCEUR     Imaging: No results found.   Medications:    cefTRIAXone (ROCEPHIN)  IV 2 g  (09/16/21 1047)    ALPRAZolam  1 mg Oral QHS   apixaban  2.5 mg Oral BID   Chlorhexidine Gluconate Cloth  6 each Topical Daily   feeding supplement  1 Container Oral TID BM   fluticasone  2 spray Each Nare BID   folic acid  9,528 mcg Oral Daily   gabapentin  300 mg Oral BID AC & HS   hydroxychloroquine  200 mg Oral BID   metoprolol succinate  12.5 mg Oral QHS   mirabegron ER  50 mg Oral Daily   pantoprazole  40 mg Oral Daily   polyethylene glycol  17 g Oral QHS   pravastatin  20 mg Oral QHS   vitamin B-12  1,000 mcg Oral Daily   acetaminophen **OR** acetaminophen, chlorhexidine, loratadine, ondansetron **OR** ondansetron (ZOFRAN) IV  Assessment/ Plan:  [name] is a 87 y.o.  male with a past medical history of chronic diastolic heart failure, rheumatoid arthritis, chronic hyponatremia, chronic indwelling Foley, and chronic kidney disease.  Patient presents to the emergency room with complaints of progressive fatigue.  He has been admitted for AKI (acute kidney injury) ([place]) [N17.9] Acute kidney injury ([place]) [N17.9] Hypotension, unspecified hypotension type [I95.9]   Acute Kidney Injury on chronic kidney disease stage 3a with baseline creatinine 1.2 and GFR of 58 on 08/10/21.  Acute kidney injury secondary to believed overdiuresis with poor appetite Chronic kidney disease is secondary to right nephrectomy No IV contrast exposure.  No indication for dialysis at this time.  Avoid nephrotoxic therapies.  Creatinine at baseline.  We will hold diuretics and evaluate need outpatient.  Continue TED hose daily for lower extremity edema  Lab Results  Component Value Date   CREATININE 1.05 09/16/2021   CREATININE 1.19 09/15/2021   CREATININE 1.66 (H) 09/14/2021    Intake/Output Summary (Last 24 hours) at 09/16/2021 1138 Last data filed at 09/16/2021 1018 Gross per 24 hour  Intake 240 ml  Output 1100 ml  Net -860 ml  2.  Chronic diastolic heart failure: Echo from June 2021  shows EF of 60 to 65%.  Fluid status stable  3.  E. coli bacteremia: Continuing Rocephin   LOS: 5   10/20/202211:38 AM

## 2021-09-16 NOTE — Care Management Important Message (Signed)
Important Message  Patient Details  Name: [name] MRN: [id number] Date of Birth: May 02, 1934   Medicare Important Message Given:  Yes     Juliann Pulse A Florence Antonelli 09/16/2021, 11:22 AM

## 2021-09-17 ENCOUNTER — Telehealth: Payer: Self-pay

## 2021-09-17 DIAGNOSIS — N179 Acute kidney failure, unspecified: Secondary | ICD-10-CM | POA: Diagnosis not present

## 2021-09-17 DIAGNOSIS — R7881 Bacteremia: Secondary | ICD-10-CM | POA: Diagnosis not present

## 2021-09-17 DIAGNOSIS — R509 Fever, unspecified: Secondary | ICD-10-CM | POA: Diagnosis not present

## 2021-09-17 DIAGNOSIS — I480 Paroxysmal atrial fibrillation: Secondary | ICD-10-CM | POA: Diagnosis not present

## 2021-09-17 DIAGNOSIS — Z978 Presence of other specified devices: Secondary | ICD-10-CM

## 2021-09-17 LAB — CBC WITH DIFFERENTIAL/PLATELET
Abs Immature Granulocytes: 0.07 10*3/uL (ref 0.00–0.07)
Basophils Absolute: 0 10*3/uL (ref 0.0–0.1)
Basophils Relative: 1 %
Eosinophils Absolute: 0.3 10*3/uL (ref 0.0–0.5)
Eosinophils Relative: 5 %
HCT: 26 % — ABNORMAL LOW (ref 39.0–52.0)
Hemoglobin: 9.2 g/dL — ABNORMAL LOW (ref 13.0–17.0)
Immature Granulocytes: 2 %
Lymphocytes Relative: 18 %
Lymphs Abs: 0.8 10*3/uL (ref 0.7–4.0)
MCH: 35.5 pg — ABNORMAL HIGH (ref 26.0–34.0)
MCHC: 35.4 g/dL (ref 30.0–36.0)
MCV: 100.4 fL — ABNORMAL HIGH (ref 80.0–100.0)
Monocytes Absolute: 0.6 10*3/uL (ref 0.1–1.0)
Monocytes Relative: 13 %
Neutro Abs: 2.8 10*3/uL (ref 1.7–7.7)
Neutrophils Relative %: 61 %
Platelets: 110 10*3/uL — ABNORMAL LOW (ref 150–400)
RBC: 2.59 MIL/uL — ABNORMAL LOW (ref 4.22–5.81)
RDW: 14.6 % (ref 11.5–15.5)
WBC: 4.6 10*3/uL (ref 4.0–10.5)
nRBC: 0 % (ref 0.0–0.2)

## 2021-09-17 LAB — COMPREHENSIVE METABOLIC PANEL
ALT: 23 U/L (ref 0–44)
AST: 27 U/L (ref 15–41)
Albumin: 2.4 g/dL — ABNORMAL LOW (ref 3.5–5.0)
Alkaline Phosphatase: 43 U/L (ref 38–126)
Anion gap: 10 (ref 5–15)
BUN: 17 mg/dL (ref 8–23)
CO2: 25 mmol/L (ref 22–32)
Calcium: 8 mg/dL — ABNORMAL LOW (ref 8.9–10.3)
Chloride: 103 mmol/L (ref 98–111)
Creatinine, Ser: 0.89 mg/dL (ref 0.61–1.24)
GFR, Estimated: >60 mL/min (ref >60)
Glucose, Bld: 111 mg/dL — ABNORMAL HIGH (ref 70–99)
Potassium: 3.8 mmol/L (ref 3.5–5.1)
Sodium: 138 mmol/L (ref 135–145)
Total Bilirubin: 0.6 mg/dL (ref 0.3–1.2)
Total Protein: 5.3 g/dL — ABNORMAL LOW (ref 6.5–8.1)

## 2021-09-17 LAB — CULTURE, BLOOD (ROUTINE X 2)
Culture: NO GROWTH
Special Requests: ADEQUATE

## 2021-09-17 LAB — PHOSPHORUS: Phosphorus: 2.6 mg/dL (ref 2.5–4.6)

## 2021-09-17 LAB — RESP PANEL BY RT-PCR (FLU A&B, COVID) ARPGX2
Influenza A by PCR: NEGATIVE
Influenza B by PCR: NEGATIVE
SARS Coronavirus 2 by RT PCR: POSITIVE — AB

## 2021-09-17 LAB — MAGNESIUM: Magnesium: 1.9 mg/dL (ref 1.7–2.4)

## 2021-09-17 MED ORDER — APIXABAN 2.5 MG PO TABS: 2.5000 mg | ORAL_TABLET | Freq: Two times a day (BID) | ORAL | Status: DC

## 2021-09-17 MED ORDER — ALPRAZOLAM 0.5 MG PO TABS: 0.5000 mg | ORAL_TABLET | Freq: Every day | ORAL | 0 refills | Status: DC

## 2021-09-17 MED ORDER — LEVOFLOXACIN 500 MG PO TABS
750.0000 mg | ORAL_TABLET | Freq: Every day | ORAL | Status: AC
  Administered 2021-09-18 – 2021-09-20 (×3): 750 mg via ORAL
  Filled 2021-09-17 (×3): qty 2

## 2021-09-17 MED ORDER — LEVOFLOXACIN 750 MG PO TABS: 750.0000 mg | ORAL_TABLET | Freq: Every day | ORAL | 0 refills | Status: DC

## 2021-09-17 MED ORDER — ONDANSETRON HCL 4 MG PO TABS: 4.0000 mg | ORAL_TABLET | Freq: Four times a day (QID) | ORAL | 0 refills | Status: DC | PRN

## 2021-09-17 MED ORDER — MAGNESIUM SULFATE 2 GM/50ML IV SOLN
2.0000 g | Freq: Once | INTRAVENOUS | Status: AC
  Administered 2021-09-17: 2 g via INTRAVENOUS
  Filled 2021-09-17: qty 50

## 2021-09-17 NOTE — Telephone Encounter (Signed)
Pt's daughter, [name], called to let us know he tested positive for covid today. He is asymptomatic but has to stay in the hospital for 10 days until he can to [employer].

## 2021-09-17 NOTE — Progress Notes (Signed)
[facility]  ROUNDING NOTE   Subjective:   [name] is an 87 year old male with a past medical history of chronic diastolic heart failure, rheumatoid arthritis, chronic hyponatremia, chronic indwelling Foley, and chronic kidney disease.  Patient presents to the emergency room with complaints of progressive fatigue.  He has been admitted for AKI (acute kidney injury) ([place]) [N17.9] Acute kidney injury ([place]) [N17.9] Hypotension, unspecified hypotension type [I95.9]  Patient is followed by our clinic and receives care from Dr. Holley Raring.   Patient seen while eating breakfast Tolerating meals Denies shortness of breath Edema improved  Objective:  Vital signs in last 24 hours:  Temp:  [97.4 F (36.3 C)-98.7 F (37.1 C)] 97.5 F (36.4 C) (10/21 0817) Pulse Rate:  [66-71] 66 (10/21 0817) Resp:  [16-22] 16 (10/21 0817) BP: (114-158)/(40-55) 142/55 (10/21 0817) SpO2:  [93 %-98 %] 93 % (10/21 0817)  Weight change:  Filed Weights   09/11/21 1219  Weight: 95 kg    Intake/Output: I/O last 3 completed shifts: In: -  Out: 825 [Urine:825]   Intake/Output this shift:  Total I/O In: 440 [P.O.:240; IV Piggyback:200] Out: -   Physical Exam: General: NAD, resting in bed  Head: Normocephalic, atraumatic. Moist oral mucosal membranes  Eyes: Anicteric  Lungs:  Clear to auscultation, normal breathing effort  Heart: Regular rate and rhythm  Abdomen:  Soft, nontender  Extremities: Trace peripheral edema.  Neurologic: Nonfocal, moving all four extremities  Skin: No lesions  GU Chronic Foley     Basic Metabolic Panel: Recent Labs  Lab 09/13/21 0457 09/14/21 0554 09/15/21 0845 09/15/21 0913 09/16/21 0922 09/17/21 0902  NA 131* 134* 134*  --  134* 138  K 3.8 3.7 3.3*  --  3.2* 3.8  CL 97* 98 99  --  99 103  CO2 28 26 25   --  26 25  GLUCOSE 105* 102* 104*  --  100* 111*  BUN 39* 37* 29*  --  21 17  CREATININE 2.03* 1.66* 1.19  --  1.05 0.89  CALCIUM 7.6* 7.7*  7.8*  --  7.9* 8.0*  MG  --   --   --  2.0 2.1 1.9  PHOS  --   --  2.6  --  2.6 2.6     Liver Function Tests: Recent Labs  Lab 09/11/21 1405 09/15/21 0845 09/15/21 0913 09/16/21 0922 09/17/21 0902  AST 46*  --  26 26 27   ALT 32  --  20 23 23   ALKPHOS 48  --  38 40 43  BILITOT 1.0  --  0.5 0.5 0.6  PROT 6.6  --  4.8* 4.9* 5.3*  ALBUMIN 3.6 2.3* 2.3* 2.4* 2.4*    No results for input(s): LIPASE, AMYLASE in the last 168 hours. No results for input(s): AMMONIA in the last 168 hours.  CBC: Recent Labs  Lab 09/11/21 1405 09/12/21 0524 09/13/21 0457 09/14/21 0554 09/15/21 0913 09/16/21 0922 09/17/21 0902  WBC 9.5 5.1 2.7* 3.3* 4.0 3.7* 4.6  NEUTROABS 7.7 4.6  --   --  2.8 2.2 2.8  HGB 11.1* 9.1* 8.4* 8.3* 7.9* 8.1* 9.2*  HCT 33.3* 26.9* 24.3* 24.8* 22.6* 22.8* 26.0*  MCV 103.1* 105.9* 102.1* 102.9* 100.0 99.1 100.4*  PLT 82* 69* 61* 52* 58* 73* 110*     Cardiac Enzymes: No results for input(s): CKTOTAL, CKMB, CKMBINDEX, TROPONINI in the last 168 hours.  BNP: Invalid input(s): POCBNP  CBG: No results for input(s): GLUCAP in the last 168 hours.  Microbiology: Results for orders placed or performed during the hospital encounter of 09/11/21  Resp Panel by RT-PCR (Flu A&B, Covid) Urine, Catheterized     Status: None   Collection Time: 09/11/21  3:41 PM   Specimen: Urine, Catheterized; Nasopharyngeal(NP) swabs in vial transport medium  Result Value Ref Range Status   SARS Coronavirus 2 by RT PCR NEGATIVE NEGATIVE Final    Comment: (NOTE) SARS-CoV-2 target nucleic acids are NOT DETECTED.  The SARS-CoV-2 RNA is generally detectable in upper respiratory specimens during the acute phase of infection. The lowest concentration of SARS-CoV-2 viral copies this assay can detect is 138 copies/mL. A negative result does not preclude SARS-Cov-2 infection and should not be used as the sole basis for treatment or other patient management decisions. A negative result may occur  with  improper specimen collection/handling, submission of specimen other than nasopharyngeal swab, presence of viral mutation(s) within the areas targeted by this assay, and inadequate number of viral copies(<138 copies/mL). A negative result must be combined with clinical observations, patient history, and epidemiological information. The expected result is Negative.  Fact Sheet for Patients:  EntrepreneurPulse.com.au  Fact Sheet for Healthcare Providers:  IncredibleEmployment.be  This test is no t yet approved or cleared by the [place] FDA and  has been authorized for detection and/or diagnosis of SARS-CoV-2 by FDA under an Emergency Use Authorization (EUA). This EUA will remain  in effect (meaning this test can be used) for the duration of the COVID-19 declaration under Section 564(b)(1) of the Act, 21 U.S.C.section 360bbb-3(b)(1), unless the authorization is terminated  or revoked sooner.       Influenza A by PCR NEGATIVE NEGATIVE Final   Influenza B by PCR NEGATIVE NEGATIVE Final    Comment: (NOTE) The Xpert Xpress SARS-CoV-2/FLU/RSV plus assay is intended as an aid in the diagnosis of influenza from Nasopharyngeal swab specimens and should not be used as a sole basis for treatment. Nasal washings and aspirates are unacceptable for Xpert Xpress SARS-CoV-2/FLU/RSV testing.  Fact Sheet for Patients: EntrepreneurPulse.com.au  Fact Sheet for Healthcare Providers: IncredibleEmployment.be  This test is not yet approved or cleared by the [place] FDA and has been authorized for detection and/or diagnosis of SARS-CoV-2 by FDA under an Emergency Use Authorization (EUA). This EUA will remain in effect (meaning this test can be used) for the duration of the COVID-19 declaration under Section 564(b)(1) of the Act, 21 U.S.C. section 360bbb-3(b)(1), unless the authorization is terminated  or revoked.  Performed at [facility], [address] [place], [place] [address]   CULTURE, BLOOD (ROUTINE X 2) w Reflex to ID Panel     Status: Abnormal   Collection Time: 09/12/21 11:19 AM   Specimen: BLOOD  Result Value Ref Range Status   Specimen Description   Final    BLOOD LEFT HABD Performed at [facility], [address] [place], [place] [address]    Special Requests   Final    BOTTLES DRAWN AEROBIC AND ANAEROBIC Blood Culture adequate volume Performed at [facility], [address] [place], [place] [address]    Culture  Setup Time   Final    GRAM NEGATIVE RODS ANAEROBIC BOTTLE ONLY CRITICAL RESULT CALLED TO, READ BACK BY AND VERIFIED WITH: JASON ROBBINS AT 0500 09/13/21.PMF Performed at [facility], [place] [address], [place],  [address]    Culture ESCHERICHIA COLI (A)  Final   Report Status 09/15/2021 FINAL  Final   Organism ID, Bacteria ESCHERICHIA  COLI  Final      Susceptibility   Escherichia coli - MIC*    AMPICILLIN >=32 RESISTANT Resistant     CEFAZOLIN 8 SENSITIVE Sensitive     CEFEPIME <=0.12 SENSITIVE Sensitive     CEFTAZIDIME <=1 SENSITIVE Sensitive     CEFTRIAXONE <=0.25 SENSITIVE Sensitive     CIPROFLOXACIN <=0.25 SENSITIVE Sensitive     GENTAMICIN >=16 RESISTANT Resistant     IMIPENEM <=0.25 SENSITIVE Sensitive     TRIMETH/SULFA >=320 RESISTANT Resistant     AMPICILLIN/SULBACTAM >=32 RESISTANT Resistant     PIP/TAZO <=4 SENSITIVE Sensitive     * ESCHERICHIA COLI  CULTURE, BLOOD (ROUTINE X 2) w Reflex to ID Panel     Status: None   Collection Time: 09/12/21 11:19 AM   Specimen: BLOOD  Result Value Ref Range Status   Specimen Description BLOOD RIGHT HAND  Final   Special Requests   Final    BOTTLES DRAWN AEROBIC AND ANAEROBIC Blood Culture adequate volume   Culture   Final    NO GROWTH 5 DAYS Performed at [facility], [address] [place], [place] [address]    Report Status 09/17/2021  FINAL  Final  Urine Culture     Status: Abnormal   Collection Time: 09/12/21 11:19 AM   Specimen: Urine, Random  Result Value Ref Range Status   Specimen Description   Final    URINE, RANDOM Performed at [facility], [address] [place], [place] [address]    Special Requests   Final    Normal Performed at [facility], [address] [place], [place] [address]    Culture MULTIPLE SPECIES PRESENT, SUGGEST RECOLLECTION (A)  Final   Report Status 09/13/2021 FINAL  Final  Blood Culture ID Panel (Reflexed)     Status: Abnormal   Collection Time: 09/12/21 11:19 AM  Result Value Ref Range Status   Enterococcus faecalis NOT DETECTED NOT DETECTED Final   Enterococcus Faecium NOT DETECTED NOT DETECTED Final   Listeria monocytogenes NOT DETECTED NOT DETECTED Final   Staphylococcus species NOT DETECTED NOT DETECTED Final   Staphylococcus aureus (BCID) NOT DETECTED NOT DETECTED Final   Staphylococcus epidermidis NOT DETECTED NOT DETECTED Final   Staphylococcus lugdunensis NOT DETECTED NOT DETECTED Final   Streptococcus species NOT DETECTED NOT DETECTED Final   Streptococcus agalactiae NOT DETECTED NOT DETECTED Final   Streptococcus pneumoniae NOT DETECTED NOT DETECTED Final   Streptococcus pyogenes NOT DETECTED NOT DETECTED Final   A.calcoaceticus-baumannii NOT DETECTED NOT DETECTED Final   Bacteroides fragilis NOT DETECTED NOT DETECTED Final   Enterobacterales DETECTED (A) NOT DETECTED Final    Comment: Enterobacterales represent a large order of gram negative bacteria, not a single organism. CRITICAL RESULT CALLED TO, READ BACK BY AND VERIFIED WITH: JASON ROBBINS AT 0500 09/13/21.PMF    Enterobacter cloacae complex NOT DETECTED NOT DETECTED Final   Escherichia coli DETECTED (A) NOT DETECTED Final    Comment: CRITICAL RESULT CALLED TO, READ BACK BY AND VERIFIED WITH: JASON ROBBINS AT 0500 09/13/21.PMF    Klebsiella aerogenes NOT DETECTED NOT DETECTED Final    Klebsiella oxytoca NOT DETECTED NOT DETECTED Final   Klebsiella pneumoniae NOT DETECTED NOT DETECTED Final   Proteus species NOT DETECTED NOT DETECTED Final   Salmonella species NOT DETECTED NOT DETECTED Final   Serratia marcescens NOT DETECTED NOT DETECTED Final   Haemophilus influenzae NOT DETECTED NOT DETECTED Final   Neisseria meningitidis NOT DETECTED NOT DETECTED Final   Pseudomonas aeruginosa  NOT DETECTED NOT DETECTED Final   Stenotrophomonas maltophilia NOT DETECTED NOT DETECTED Final   Candida albicans NOT DETECTED NOT DETECTED Final   Candida auris NOT DETECTED NOT DETECTED Final   Candida glabrata NOT DETECTED NOT DETECTED Final   Candida krusei NOT DETECTED NOT DETECTED Final   Candida parapsilosis NOT DETECTED NOT DETECTED Final   Candida tropicalis NOT DETECTED NOT DETECTED Final   Cryptococcus neoformans/gattii NOT DETECTED NOT DETECTED Final   CTX-M ESBL NOT DETECTED NOT DETECTED Final   Carbapenem resistance IMP NOT DETECTED NOT DETECTED Final   Carbapenem resistance KPC NOT DETECTED NOT DETECTED Final   Carbapenem resistance NDM NOT DETECTED NOT DETECTED Final   Carbapenem resist OXA 48 LIKE NOT DETECTED NOT DETECTED Final   Carbapenem resistance VIM NOT DETECTED NOT DETECTED Final    Comment: Performed at [facility], [address] [place], [place] [address]   *Note: Due to a large number of results and/or encounters for the requested time period, some results have not been displayed. A complete set of results can be found in Results Review.    Coagulation Studies: No results for input(s): LABPROT, INR in the last 72 hours.   Urinalysis: No results for input(s): COLORURINE, LABSPEC, PHURINE, GLUCOSEU, HGBUR, BILIRUBINUR, KETONESUR, PROTEINUR, UROBILINOGEN, NITRITE, LEUKOCYTESUR in the last 72 hours.  Invalid input(s): APPERANCEUR     Imaging: No results found.   Medications:    magnesium sulfate bolus IVPB 2 g (09/17/21 1104)    ALPRAZolam   1 mg Oral QHS   apixaban  2.5 mg Oral BID   Chlorhexidine Gluconate Cloth  6 each Topical Daily   feeding supplement  1 Container Oral TID BM   fluticasone  2 spray Each Nare BID   folic acid  7,035 mcg Oral Daily   gabapentin  300 mg Oral BID AC & HS   hydroxychloroquine  200 mg Oral BID   [START ON 09/18/2021] levofloxacin  750 mg Oral Daily   metoprolol succinate  12.5 mg Oral QHS   mirabegron ER  50 mg Oral Daily   pantoprazole  40 mg Oral Daily   polyethylene glycol  17 g Oral QHS   pravastatin  20 mg Oral QHS   vitamin B-12  1,000 mcg Oral Daily   acetaminophen **OR** acetaminophen, chlorhexidine, loratadine, ondansetron **OR** ondansetron (ZOFRAN) IV  Assessment/ Plan:  [name] is a 87 y.o.  male with a past medical history of chronic diastolic heart failure, rheumatoid arthritis, chronic hyponatremia, chronic indwelling Foley, and chronic kidney disease.  Patient presents to the emergency room with complaints of progressive fatigue.  He has been admitted for AKI (acute kidney injury) ([place]) [N17.9] Acute kidney injury ([place]) [N17.9] Hypotension, unspecified hypotension type [I95.9]   Acute Kidney Injury on chronic kidney disease stage 3a with baseline creatinine 1.2 and GFR of 58 on 08/10/21.  Acute kidney injury secondary to believed overdiuresis with poor appetite Chronic kidney disease is secondary to right nephrectomy No IV contrast exposure.  No indication for dialysis at this time.  Avoid nephrotoxic therapies.  Creatinine remains at baseline. Continue use of TEDs during day to manage edema. Will schedule hospital follow up for 1-2 weeks after discharge with nephrology.   Lab Results  Component Value Date   CREATININE 0.89 09/17/2021   CREATININE 1.05 09/16/2021   CREATININE 1.19 09/15/2021    Intake/Output Summary (Last 24 hours) at 09/17/2021 1125 Last data filed at 09/17/2021 1026 Gross per 24 hour  Intake  440 ml  Output 175 ml  Net 265 ml    2.   Chronic diastolic heart failure: Echo from June 2021 shows EF of 60 to 65%.  Stable fluid status. Will determine need for diuretic outpatient  3.  E. coli bacteremia: Continue treatment with Levofloxacin at discharge   LOS: 6   10/21/202211:25 AM

## 2021-09-17 NOTE — TOC Progression Note (Signed)
Transition of Care [facility]) - Progression Note    Patient Details  Name: [name] MRN: [id number] Date of Birth: 1933/12/11  [facility] CM/SW [place], RN Phone Number: 09/17/2021, 9:07 AM  Clinical Narrative:   Obtained PASSR number [id number]    Expected Discharge Plan: Skilled Nursing Facility Barriers to Discharge: Continued Medical Work up  Expected Discharge Plan and Services Expected Discharge Plan: [place]       Living arrangements for the past 2 months: Single Family Home                                       Social Determinants of Health (SDOH) Interventions    Readmission Risk Interventions Readmission Risk Prevention Plan 09/12/2021 06/23/2019  Transportation Screening Complete Complete  PCP or Specialist Appt within 3-5 Days - Complete  HRI or [place] - Complete  Social Work Consult for [place] Planning/Counseling - Complete  Palliative Care Screening - Not Applicable  Medication Review Press photographer) Complete Complete  PCP or Specialist appointment within 3-5 days of discharge Complete -  [place] or Home Care Consult Complete -  SW Recovery Care/Counseling Consult Complete -  Palliative Care Screening Not Applicable -  Skilled Nursing Facility Complete -  Some recent data might be hidden

## 2021-09-17 NOTE — Progress Notes (Signed)
COVID swab came back positive. MD, RN CM, and charge RN made aware. Patient has been placed on isolation.

## 2021-09-17 NOTE — Discharge Summary (Addendum)
Physician Discharge Summary  [name] VXB:[id number] DOB: 14-Jan-1934 DOA: 09/11/2021  PCP: [name], MD  Admit date: 09/11/2021 Discharge date: 09/28/2021  Admitted From: Home Disposition: SNF  Recommendations for Outpatient Follow-up:  Follow up with PCP in 1-2 weeks Follow up with Nephrology within 1-2 weeks  Follow up with Cardiology within 1-2 weeks Please obtain CMP/CBC, Mag, Phos in one week Please follow up on the following pending results:  Home Health: No  Equipment/Devices: None    Discharge Condition: Stable  CODE STATUS: FULL CODE  Diet recommendation: Heart Healthy Carb Modified Diet   Brief/Interim Summary: The patient is an 87 year old Caucasian male with a past medical history significant for 2 right renal nephrectomy given renal mass, chronic hyponatremia, chronic indwelling Foley catheter, chronic diastolic CHF, essential hypertension, persistent atrial fibrillation as well as other comorbidities who presented with hypotension initially.  Initial urinalysis showed blood but nitrates and leuk esterase was negative.  Initially antibiotics were held off but he spiked a temperature of 103 and blood cultures were drawn and E. coli was shown growing out of 1 out of 4 bottles.  His Foley catheter was changed out by urology and he is placed on IV ceftriaxone.  Nephrology been following given his AKI on admission and his IV fluids have now been stopped.  The E. coli bacteremia sensitivities came back and he is sensitive to cefazolin, Zosyn, imipenem, ciprofloxacin, and ceftriaxone which we will continue while hospitalized and change to po Levofloxacin at D/C.  We will obtain PT OT for further evaluate and treat and they are recommending SNF. His Renal Fxn is improved and he is stable to D/C to SNF  Discharge Diagnoses:  Principal Problem:   E coli bacteremia Active Problems:   GAD (generalized anxiety disorder)   Coronary atherosclerosis of native coronary artery    IBS   Neurogenic bladder   Hyperlipidemia   Fever   Chronic constipation   AF (paroxysmal atrial fibrillation) (HCC)   Pancytopenia (HCC)   Rheumatoid arthritis involving multiple sites with positive rheumatoid factor (HCC)   Chronic pain syndrome   DDD (degenerative disc disease), lumbosacral   Acute kidney injury superimposed on CKD ([place])   COVID-19 virus infection  E. coli bacteremia in the setting of UTI and Indwelling Foley  -Had a episode of hypotension when he came in and then spiked a temperature of 103 on 09/13/2019 -And a temperature of 101 the night before.  He completed Abx course -Foley catheter was changed -Urine culture showed multiple organisms  COVID-19 + -Incidental Finding as he was swabbed for SNF and tested positive -Asymptomatic -had to be isolated for 10 days due to SNF policy. He is being D/Ced to SNF today -Not Hypoxic and has no cough or Respiratory Sx   AKI on CKD stage IIIa Hx of Right Renal Nephrectomy  Chronic Indwelling Foley  -Improved with hydration -Baseline creatinine is around 1.1-1.3 and in the setting of his right renal nephrectomy -Avoid further nephrotoxic medications, contrast dyes, hypotension renally dose medications -Follow up with Nephrology within 1 week    Hyponatremia -Mild and resolved with hydration   Hypokalemia -repleted and resolved   Macrocytic anemia/anemia of chronic disease -stable H & H   Thrombocytopenia -Likely in the setting of infection as above -Patient's platelet count went from 82 and trended down to 52 and now is 58 -> 170   Paroxysmal atrial fibrillation -On Eliquis at 5 mg p.o. twice daily for anticoagulation -rate controlled on Toprol  succinate 12.5 g p.o. daily. -Follow up with Cardiology as an outpatient    Rheumatoid arthritis -Continue with Hydroxychloroquine 200 mg po BID   Hyperlipidemia -Continue with Pravastatin 20 mg p.o. nightly   Neuropathy -on Gabapentin    Pancytopenia -Pathology is reviewed and showed giant platelets and no schistocytes -Repeat CBC within 1 week   Chronic Diastolic CHF -Echo in June 2021: EF of 60 to 65% -Continue monitor for signs and symptoms of volume overload  -Net IO Since Admission: -9,847.07 mL [09/28/21 0837]  -Follow up with Cardiology as an outpatient    Obesity -Complicates overall prognosis and Care -Estimated body mass index is 35.56 kg/m as calculated from the following:   Height as of this encounter: 5\' 7"  (1.702 m).   Weight as of this encounter: 103 kg. -Weight Loss and Dietary Counseling    Discharge Instructions Discharge Instructions     Call MD for:  difficulty breathing, headache or visual disturbances   Complete by: As directed    Call MD for:  extreme fatigue   Complete by: As directed    Call MD for:  hives   Complete by: As directed    Call MD for:  persistant dizziness or light-headedness   Complete by: As directed    Call MD for:  persistant nausea and vomiting   Complete by: As directed    Call MD for:  redness, tenderness, or signs of infection (pain, swelling, redness, odor or green/yellow discharge around incision site)   Complete by: As directed    Call MD for:  severe uncontrolled pain   Complete by: As directed    Call MD for:  temperature >100.4   Complete by: As directed    Diet - low sodium heart healthy   Complete by: As directed    Discharge instructions   Complete by: As directed    You were cared for by a hospitalist during your hospital stay. If you have any questions about your discharge medications or the care you received while you were in the hospital after you are discharged, you can call the unit and ask to speak with the hospitalist on call if the hospitalist that took care of you is not available. Once you are discharged, your primary care physician will handle any further medical issues. Please note that NO REFILLS for any discharge medications will be  authorized once you are discharged, as it is imperative that you return to your primary care physician (or establish a relationship with a primary care physician if you do not have one) for your aftercare needs so that they can reassess your need for medications and monitor your lab values.  Follow up with PCP, Nephrology, and Cardiology within 1-2 weeks. Take all medications as prescribed. If symptoms change or worsen please return to the ED for evaluation   Increase activity slowly   Complete by: As directed    Increase activity slowly   Complete by: As directed       Allergies as of 09/28/2021       Reactions   Ciprofloxacin Nausea And Vomiting   Headache   Citalopram Hydrobromide Other (See Comments)   "unknown"   Clindamycin Nausea And Vomiting   Lorazepam Other (See Comments)   Adverse reaction   Paroxetine Nausea Only   Ramipril Other (See Comments)   "unknown"   Simvastatin    Sulfa Antibiotics Other (See Comments)        Medication List  STOP taking these medications    Calcium-Magnesium-Zinc 333-133-5 MG Tabs   erythromycin ophthalmic ointment   furosemide 20 MG tablet Commonly known as: LASIX   metoprolol tartrate 25 MG tablet Commonly known as: LOPRESSOR       TAKE these medications    acetaminophen 500 MG tablet Commonly known as: TYLENOL Take 500 mg by mouth 2 (two) times daily. May take an additional 500mg  as needed for arthritis   Align 4 MG Caps Take 1 capsule (4 mg total) by mouth daily.   ALOE PO Take 400 mg by mouth daily.   ALPRAZolam 0.5 MG tablet Commonly known as: XANAX Take 1 tablet (0.5 mg total) by mouth at bedtime. May take an additional 0.5mg  at 0400-0430 as needed What changed: See the new instructions.   apixaban 5 MG Tabs tablet Commonly known as: Eliquis Take 1 tablet (5 mg total) by mouth 2 (two) times daily. What changed: See the new instructions.   BALANCED CARE SENIORS PO Take 6 tablets by mouth daily. 3  tablets of fruit and 3 tablets of vegetables daily  Balance of Nature   cetirizine 10 MG tablet Commonly known as: ZYRTEC Take 10 mg by mouth at bedtime.   chlorhexidine 0.12 % solution Commonly known as: PERIDEX 1 mL by Mouth Rinse route as needed.   Co Q 10 100 MG Caps Take 100 mg by mouth daily.   CRANBERRY SOFT PO Take 250 mg by mouth daily.   EPINEPHrine 0.3 mg/0.3 mL Soaj injection Commonly known as: EPI-PEN Inject 0.3 mg into the muscle as needed for anaphylaxis.   Fish Oil 1200 MG Caps Take 1,200 mg by mouth daily.   fluticasone 50 MCG/ACT nasal spray Commonly known as: FLONASE Place 2 sprays into both nostrils 2 (two) times daily.   folic acid 924 MCG tablet Commonly known as: FOLVITE Take 1,200 mcg by mouth daily.   gabapentin 600 MG tablet Commonly known as: NEURONTIN Take 600 mg by mouth 2 (two) times daily at 8 am and 10 pm. What changed: Another medication with the same name was removed. Continue taking this medication, and follow the directions you see here.   hydroxychloroquine 200 MG tablet Commonly known as: PLAQUENIL Take 200 mg by mouth 2 (two) times daily.   ketoconazole 2 % shampoo Commonly known as: NIZORAL Apply 1 application topically 2 (two) times a week.   ketoconazole 2 % cream Commonly known as: NIZORAL Apply 1 application topically 2 (two) times daily.   methotrexate 2.5 MG tablet Commonly known as: RHEUMATREX Take 10 tablets by mouth once a week. Taken on Wednesdays   metoprolol succinate 25 MG 24 hr tablet Commonly known as: TOPROL-XL Take 0.5 tablets (12.5 mg total) by mouth at bedtime.   milk thistle 175 MG tablet Take 175 mg by mouth daily.   Myrbetriq 50 MG Tb24 tablet Generic drug: mirabegron ER TAKE 1 TABLET(50 MG) BY MOUTH DAILY What changed: See the new instructions.   omeprazole 20 MG capsule Commonly known as: PRILOSEC Take 1 capsule (20 mg total) by mouth daily as needed.   ondansetron 4 MG tablet Commonly  known as: ZOFRAN Take 1 tablet (4 mg total) by mouth every 6 (six) hours as needed for nausea.   PHOSPHATIDYL CHOLINE PO Take 1 tablet by mouth daily.   polyethylene glycol powder 17 GM/SCOOP powder Commonly known as: GLYCOLAX/MIRALAX Take 17 g by mouth at bedtime.   pravastatin 20 MG tablet Commonly known as: PRAVACHOL TAKE 1 TABLET(20 MG) BY  MOUTH AT BEDTIME What changed: See the new instructions.   Selenium 200 MCG Caps Take 200 mcg by mouth daily.   traZODone 50 MG tablet Commonly known as: DESYREL Take 50 mg by mouth at bedtime.   triamcinolone ointment 0.1 % Commonly known as: KENALOG Apply 1 application topically as needed.   Turmeric 500 MG Caps Take 500 mg by mouth daily.   vitamin B-12 1000 MCG tablet Commonly known as: CYANOCOBALAMIN Take 1,000 mcg by mouth daily.   VITAMIN C PO Take 1,000 mg by mouth daily.   Vitamin D3 125 MCG (5000 UT) Caps Take 2,000 Units by mouth daily.   Zinc 50 MG Tabs Take by mouth.        Contact information for after-discharge care     Destination     [facility] .   Service: Skilled Nursing Contact information: [address] [place] [address] [phone]                    Allergies  Allergen Reactions   Ciprofloxacin Nausea And Vomiting    Headache   Citalopram Hydrobromide Other (See Comments)    "unknown"   Clindamycin Nausea And Vomiting   Lorazepam Other (See Comments)    Adverse reaction   Paroxetine Nausea Only   Ramipril Other (See Comments)    "unknown"   Simvastatin    Sulfa Antibiotics Other (See Comments)   Consultations: Nephrology  Procedures/Studies: DG Chest Port 1 View  Result Date: 09/18/2021 CLINICAL DATA:  Coronavirus infection. EXAM: PORTABLE CHEST 1 VIEW COMPARISON:  09/11/2021 FINDINGS: Cardiomegaly as seen previously. Chronic aortic atherosclerotic calcification. Patchy density in both lower lobes, left more than right,  consistent with atelectasis and or mild basilar pneumonia. No lobar consolidation or collapse. There could possibly be a small amount of pleural fluid on the left. IMPRESSION: Patchy density in the lung bases left more than right consistent with atelectasis or mild basilar pneumonia. Similar or slightly worsened compared to the study of 09/11/2021. Electronically Signed   By: Nelson Chimes M.D.   On: 09/18/2021 10:43   DG Chest Portable 1 View  Result Date: 09/11/2021 CLINICAL DATA:  Weakness, evaluate for edema EXAM: PORTABLE CHEST 1 VIEW COMPARISON:  Chest radiograph 01/18/2021 FINDINGS: The heart is at the upper limits of normal for size. The mediastinal contours are stable, with unchanged calcified atherosclerotic plaque of the aortic arch. There is vascular congestion and mild pulmonary interstitial edema. There is no focal consolidation. There is no pleural effusion. There is no pneumothorax. An accessory azygous fissure is again noted. There is marked degenerative change of the shoulders. IMPRESSION: Mild pulmonary interstitial edema. Electronically Signed   By: Valetta Mole M.D.   On: 09/11/2021 16:06     Subjective: no new omplaints   Discharge Exam: Vitals:   09/27/21 2003 09/28/21 0645  BP: 125/60 (!) 128/50  Pulse: 74 77  Resp: 17 18  Temp: 97.6 F (36.4 C) 97.8 F (36.6 C)  SpO2: 94% 93%   Vitals:   09/27/21 0805 09/27/21 1703 09/27/21 2003 09/28/21 0645  BP: (!) 118/41 (!) 103/49 125/60 (!) 128/50  Pulse: 72 71 74 77  Resp: 18 16 17 18   Temp: 97.7 F (36.5 C) 98.2 F (36.8 C) 97.6 F (36.4 C) 97.8 F (36.6 C)  TempSrc:   Oral Oral  SpO2: 97% 96% 94% 93%  Weight:      Height:       General: Pt  is alert, awake, not in acute distress Cardiovascular: RRR, S1/S2 +, no rubs, no gallops Respiratory: Diminished bilaterally, no wheezing, no rhonchi; Unlabored breathing and not wearing supplemental O2 via [place] Abdominal: Soft, NT, Distended 2/2 body habiuts, bowel sounds  + Extremities: trace LE edema, no cyanosis  The results of significant diagnostics from this hospitalization (including imaging, microbiology, ancillary and laboratory) are listed below for reference.    Microbiology: No results found for this or any previous visit (from the past 240 hour(s)).    Labs: BNP (last 3 results) Recent Labs    09/11/21 1405  BNP 492.0*   Basic Metabolic Panel: Recent Labs  Lab 09/22/21 0210 09/23/21 0523 09/24/21 0532  NA 135 136 136  K 3.8 3.5 3.5  CL 103 99 100  CO2 28 29 28   GLUCOSE 100* 88 97  BUN 16 15 15   CREATININE 1.10 1.08 1.04  CALCIUM 7.8* 8.0* 8.2*   Liver Function Tests: Recent Labs  Lab 09/22/21 0210 09/23/21 0523 09/24/21 0532  AST 32 29 28  ALT 23 22 20   ALKPHOS 44 43 42  BILITOT 0.6 0.8 0.8  PROT 5.3* 5.3* 5.5*  ALBUMIN 2.6* 2.7* 2.8*   No results for input(s): LIPASE, AMYLASE in the last 168 hours. No results for input(s): AMMONIA in the last 168 hours. CBC: Recent Labs  Lab 09/22/21 0210  HGB 9.0*  HCT 26.4*   Cardiac Enzymes: No results for input(s): CKTOTAL, CKMB, CKMBINDEX, TROPONINI in the last 168 hours. BNP: Invalid input(s): POCBNP CBG: Recent Labs  Lab 09/21/21 1644  GLUCAP 112*   D-Dimer Recent Labs    09/27/21 0605 09/28/21 0437  DDIMER 0.46 0.46   Hgb A1c No results for input(s): HGBA1C in the last 72 hours. Lipid Profile No results for input(s): CHOL, HDL, LDLCALC, TRIG, CHOLHDL, LDLDIRECT in the last 72 hours. Thyroid function studies No results for input(s): TSH, T4TOTAL, T3FREE, THYROIDAB in the last 72 hours.  Invalid input(s): FREET3 Anemia work up No results for input(s): VITAMINB12, FOLATE, FERRITIN, TIBC, IRON, RETICCTPCT in the last 72 hours. Urinalysis    Component Value Date/Time   COLORURINE AMBER (A) 09/12/2021 1119   APPEARANCEUR CLOUDY (A) 09/12/2021 1119   APPEARANCEUR Cloudy (A) 06/06/2019 1555   LABSPEC 1.020 09/12/2021 1119   LABSPEC 1.025 09/28/2014  1610   PHURINE 5.0 09/12/2021 1119   GLUCOSEU NEGATIVE 09/12/2021 1119   GLUCOSEU see comment 09/28/2014 1610   HGBUR MODERATE (A) 09/12/2021 1119   HGBUR trace-lysed 02/22/2010 1536   BILIRUBINUR NEGATIVE 09/12/2021 1119   BILIRUBINUR Negative 06/06/2019 1555   BILIRUBINUR see comment 09/28/2014 1610   KETONESUR NEGATIVE 09/12/2021 1119   PROTEINUR 100 (A) 09/12/2021 1119   UROBILINOGEN 0.2 02/22/2010 1536   NITRITE NEGATIVE 09/12/2021 1119   LEUKOCYTESUR TRACE (A) 09/12/2021 1119   LEUKOCYTESUR see comment 09/28/2014 1610   Sepsis Labs Invalid input(s): PROCALCITONIN,  WBC,  LACTICIDVEN Microbiology No results found for this or any previous visit (from the past 240 hour(s)).  Time coordinating discharge: 35 minutes  SIGNED:  Max Sane, DO [facility] 09/28/2021, 8:37 AM Pager is on [place]  If 7PM-7AM, please contact night-coverage [email or web address]

## 2021-09-18 ENCOUNTER — Inpatient Hospital Stay: Payer: Medicare PPO

## 2021-09-18 DIAGNOSIS — U071 COVID-19: Secondary | ICD-10-CM

## 2021-09-18 DIAGNOSIS — D61818 Other pancytopenia: Secondary | ICD-10-CM | POA: Diagnosis not present

## 2021-09-18 DIAGNOSIS — I480 Paroxysmal atrial fibrillation: Secondary | ICD-10-CM | POA: Diagnosis not present

## 2021-09-18 DIAGNOSIS — R7881 Bacteremia: Secondary | ICD-10-CM | POA: Diagnosis not present

## 2021-09-18 DIAGNOSIS — N179 Acute kidney failure, unspecified: Secondary | ICD-10-CM | POA: Diagnosis not present

## 2021-09-18 LAB — CBC WITH DIFFERENTIAL/PLATELET
Abs Immature Granulocytes: 0.06 10*3/uL (ref 0.00–0.07)
Basophils Absolute: 0.1 10*3/uL (ref 0.0–0.1)
Basophils Relative: 1 %
Eosinophils Absolute: 0.3 10*3/uL (ref 0.0–0.5)
Eosinophils Relative: 5 %
HCT: 25.5 % — ABNORMAL LOW (ref 39.0–52.0)
Hemoglobin: 9 g/dL — ABNORMAL LOW (ref 13.0–17.0)
Immature Granulocytes: 1 %
Lymphocytes Relative: 20 %
Lymphs Abs: 1 10*3/uL (ref 0.7–4.0)
MCH: 35.7 pg — ABNORMAL HIGH (ref 26.0–34.0)
MCHC: 35.3 g/dL (ref 30.0–36.0)
MCV: 101.2 fL — ABNORMAL HIGH (ref 80.0–100.0)
Monocytes Absolute: 0.6 10*3/uL (ref 0.1–1.0)
Monocytes Relative: 13 %
Neutro Abs: 3.1 10*3/uL (ref 1.7–7.7)
Neutrophils Relative %: 60 %
Platelets: 162 10*3/uL (ref 150–400)
RBC: 2.52 MIL/uL — ABNORMAL LOW (ref 4.22–5.81)
RDW: 14.5 % (ref 11.5–15.5)
Smear Review: NORMAL
WBC: 5.1 10*3/uL (ref 4.0–10.5)
nRBC: 0 % (ref 0.0–0.2)

## 2021-09-18 LAB — SEDIMENTATION RATE: Sed Rate: 128 mm/hr — ABNORMAL HIGH (ref 0–20)

## 2021-09-18 LAB — FIBRINOGEN: Fibrinogen: 609 mg/dL — ABNORMAL HIGH (ref 210–475)

## 2021-09-18 LAB — COMPREHENSIVE METABOLIC PANEL
ALT: 19 U/L (ref 0–44)
AST: 22 U/L (ref 15–41)
Albumin: 2.3 g/dL — ABNORMAL LOW (ref 3.5–5.0)
Alkaline Phosphatase: 43 U/L (ref 38–126)
Anion gap: 7 (ref 5–15)
BUN: 15 mg/dL (ref 8–23)
CO2: 27 mmol/L (ref 22–32)
Calcium: 7.8 mg/dL — ABNORMAL LOW (ref 8.9–10.3)
Chloride: 104 mmol/L (ref 98–111)
Creatinine, Ser: 0.81 mg/dL (ref 0.61–1.24)
GFR, Estimated: >60 mL/min (ref >60)
Glucose, Bld: 111 mg/dL — ABNORMAL HIGH (ref 70–99)
Potassium: 3.8 mmol/L (ref 3.5–5.1)
Sodium: 138 mmol/L (ref 135–145)
Total Bilirubin: 0.6 mg/dL (ref 0.3–1.2)
Total Protein: 5.2 g/dL — ABNORMAL LOW (ref 6.5–8.1)

## 2021-09-18 LAB — LACTATE DEHYDROGENASE: LDH: 217 U/L — ABNORMAL HIGH (ref 98–192)

## 2021-09-18 LAB — FERRITIN: Ferritin: 129 ng/mL (ref 24–336)

## 2021-09-18 LAB — MAGNESIUM: Magnesium: 1.9 mg/dL (ref 1.7–2.4)

## 2021-09-18 LAB — PHOSPHORUS: Phosphorus: 3.4 mg/dL (ref 2.5–4.6)

## 2021-09-18 LAB — D-DIMER, QUANTITATIVE: D-Dimer, Quant: 1.23 ug/mL-FEU — ABNORMAL HIGH (ref 0.00–0.50)

## 2021-09-18 LAB — C-REACTIVE PROTEIN: CRP: 8.4 mg/dL — ABNORMAL HIGH (ref <1.0)

## 2021-09-18 MED ORDER — SODIUM CHLORIDE 0.9 % IV SOLN: 100.0000 mg | Freq: Every day | INTRAVENOUS | Status: DC

## 2021-09-18 MED ORDER — HYDROCOD POLST-CPM POLST ER 10-8 MG/5ML PO SUER
5.0000 mL | Freq: Two times a day (BID) | ORAL | Status: DC | PRN
  Administered 2021-09-18 – 2021-09-19 (×2): 5 mL via ORAL
  Filled 2021-09-18 (×2): qty 5

## 2021-09-18 MED ORDER — SODIUM CHLORIDE 0.9 % IV SOLN
100.0000 mg | Freq: Every day | INTRAVENOUS | Status: AC
  Administered 2021-09-19 – 2021-09-20 (×2): 100 mg via INTRAVENOUS
  Filled 2021-09-18 (×2): qty 100

## 2021-09-18 MED ORDER — SODIUM CHLORIDE 0.9 % IV SOLN
200.0000 mg | Freq: Once | INTRAVENOUS | Status: AC
  Administered 2021-09-18: 200 mg via INTRAVENOUS
  Filled 2021-09-18: qty 200

## 2021-09-18 MED ORDER — IPRATROPIUM-ALBUTEROL 20-100 MCG/ACT IN AERS
1.0000 | INHALATION_SPRAY | Freq: Four times a day (QID) | RESPIRATORY_TRACT | Status: DC | PRN
  Filled 2021-09-18: qty 4

## 2021-09-18 MED ORDER — SODIUM CHLORIDE 0.9 % IV SOLN: 200.0000 mg | Freq: Once | INTRAVENOUS | Status: DC

## 2021-09-18 MED ORDER — GUAIFENESIN-DM 100-10 MG/5ML PO SYRP: 5.0000 mL | ORAL_SOLUTION | ORAL | Status: DC | PRN

## 2021-09-18 NOTE — Progress Notes (Signed)
PROGRESS NOTE    [name]  NWG:[id number] DOB: 1934-04-02 DOA: 09/11/2021 PCP: [name], MD   Brief Narrative:  The patient is an 87 year old Caucasian male with a past medical history significant for 2 right renal nephrectomy given renal mass, chronic hyponatremia, chronic indwelling Foley catheter, chronic diastolic CHF, essential hypertension, persistent atrial fibrillation as well as other comorbidities who presented with hypotension initially.  Initial urinalysis showed blood but nitrates and leuk esterase was negative.  Initially antibiotics were held off but he spiked a temperature of 103 and blood cultures were drawn and E. coli was shown growing out of 1 out of 4 bottles.  His Foley catheter was changed out by urology and he is placed on IV ceftriaxone.  Nephrology been following given his AKI on admission and his IV fluids have now been stopped.  The E. coli bacteremia sensitivities came back and he is sensitive to cefazolin, Zosyn, imipenem, ciprofloxacin, and ceftriaxone which we will continue while hospitalized and change to po Levofloxacin at D/C.  We will obtain PT OT for further evaluate and treat and they are recommending SNF. His Renal Fxn is improved and he was stable to D/C to SNF however prior to D/C to SNF he tested Positive for COVID 19. His Inflammatory markers are elevated but He is asymptomatic and will need Isolation for 10 Days and can go to SNF on Sep 28, 2021.    Assessment & Plan:   Active Problems:   Fever   AF (paroxysmal atrial fibrillation) (HCC)   Pancytopenia (HCC)   Acute kidney injury superimposed on CKD (HCC)   E coli bacteremia  E. coli bacteremia in the setting of UTI and Indwelling Foley  -Had a episode of hypotension when he came in and then spiked a temperature of 103 on 09/13/2019 -And a temperature of 12:101 the night before.  He is continuing on IV ceftriaxone and sensitivities showing that he is sensitive to cefazolin, Zosyn, imipenem,  ciprofloxacin and ceftriaxone. -Foley catheter was changed -Urine culture showed multiple organisms -Continue empiric antibiotics with IV Ceftriaxone and monitor clinical response to intervention; Change to po Levofloxacin at D/C for 3 days with end date on 09/20/21 -We will message ID for the gram-negative bacteremia pilot recommendations -PT OT evaluated and recommending SNF and awaiting placement and Insurance Authorization  COVID-19 + -Incidental Finding as he was swabbed for SNF and tested positive -Asymptomatic -Checked Inflammatory Markers and Trend Recent Labs    09/18/21 1011  DDIMER 1.23*  LDH 217*  -LDH was 217 -ESR was 1.23 -Fibrinogen was 609 -CRP pending still   Lab Results  Component Value Date   SARSCOV2NAA POSITIVE (A) 09/17/2021   [place] NEGATIVE 09/11/2021   [place] NEGATIVE 06/13/2019   [place] NOT DETECTED 04/19/2019  -Airborne and Contact Precautions -Will add Anti-tussives with Tussionex and Robitussin DM -Will add Combivent 1 puff IH q6hprn -Prone if Able -Vitamin C and Zinc  -Will give 3 Days of Remdesivir given No Symptoms; Hold off on Steroids currently -Checked CXR and showed "Patchy density in the lung bases left more than right consistent with atelectasis or mild basilar  pneumonia. Similar or slightly worsened compared to the study of 09/11/2021." -He is going to be placed in Isolation now for 10 Days  -Not Hypoxic and has no cough or Respiratory Sx   AKI on CKD stage IIIa Hx of Right Renal Nephrectomy  Chronic Indwelling Foley  -Improving as patient's BUN/creatinine went from 36/2.2 and now trending down to  29/1.19 -> 21/1.05 -> 17/0.89 -> 15/0.81 -Baseline creatinine is around 1.1-1.3 and in the setting of his right renal nephrectomy -Avoid further nephrotoxic medications, contrast dyes, hypotension renally dose medications -Repeat CMP in the a.m. and nephrology was consulted and recommending no further IV fluids -Follow up  with Nephrology within 1 week    Hyponatremia -Mild.  Patient sodium is now 134 -> 138 x2 -Continue to monitor and trend and repeat CMP within 1 week    Hypokalemia -Patient's potassium is now 3.8 x2 -Mag level was 1.9 again -Continue monitor replete as necessary -Repeat CMP within 1 week    Macrocytic anemia/anemia of chronic disease -Patient's BUN/creatinine went from 11.1/33.3 and likely hemoconcentrated on admission and trended down to 7.9/22.6 and is now improved to 9.0/25.5 today  -Continue to monitor for signs and symptoms of bleeding; currently no overt bleeding noted -Check anemia panel in the outpatient setting  -Repeat CBC in a.m.   Thrombocytopenia -Likely in the setting of infection as above -Patient's platelet count went from 82 and trended down to 52 and now is 58 -> 73 -> 110 -> 162 -Continue monitor for signs of bleeding; currently no overt bleeding noted -Repeat CBC in a.m.   Paroxysmal atrial fibrillation -On low-dose Eliquis at 2.5 mg p.o. twice daily -Patient was restarted on his Toprol succinate 12.5 g p.o. daily. -Follow up with Cardiology as an outpatient    Rheumatoid arthritis -Continue with Hydroxychloroquine 200 mg po BID   Hyperlipidemia -Continue with Pravastatin 20 mg p.o. nightly   Neuropathy -Dose adjusted Gabapentin 300 mg p.o. twice daily   Pancytopenia -Pathology is reviewed and showed giant platelets and no schistocytes -WBC is now 5.1, Hgb/Hct is 9.0/25.5 with an MCV of 101.2, and Platelet Count is now 162 -Continue monitor CBCs and his leukopenia has now resolved -Repeat CBC within 1 week   Chronic Diastolic CHF -Echo in June 2021: EF of 60 to 65% -Nephrology recommending continue to hold diuretics at this time given his AKI in the setting of his overdiuresis with poor appetite -Continue monitor for signs and symptoms of volume overload and will need strict I's and O's and daily weights -Patient is -1.819 Liters  -Follow up with  Cardiology as an outpatient and resume Diuretics in the outpatient setting    Obesity -Complicates overall prognosis and Care -Estimated body mass index is 30.93 kg/m as calculated from the following:   Height as of 05/28/21: $RemoveBe'5\' 9"'[id number]$  (1.753 m).   Weight as of this encounter: 95 kg. -Weight Loss and Dietary Counseling   DVT prophylaxis: SCD he is anticoagulated with apixaban 2.5 mg p.o. twice daily Code Status: FULL CODE Family Communication: No family currently at bedside Disposition Plan: Anticipating discharging to SNF within 24 hours  Status is: Inpatient  Remains inpatient appropriate because: Patient needs treatment of his bacteremia and evaluation of his renal function.  PT OT recommending SNF and will need a safe discharge disposition and bed and insurance authorization before he can be discharged   Consultants:  Nephrology  Procedures: None  Antimicrobials:  Anti-infectives (From admission, onward)    Start     Dose/Rate Route Frequency Ordered Stop   09/18/21 1000  levofloxacin (LEVAQUIN) tablet 750 mg        750 mg Oral Daily 09/17/21 1041 09/21/21 0959   09/18/21 0000  levofloxacin (LEVAQUIN) 750 MG tablet        750 mg Oral Daily 09/17/21 1158 09/21/21 2359   09/13/21 1000  cefTRIAXone (ROCEPHIN)  2 g in sodium chloride 0.9 % 100 mL IVPB  Status:  Discontinued        2 g 200 mL/hr over 30 Minutes Intravenous Every 24 hours 09/13/21 0624 09/17/21 1041   09/12/21 1000  piperacillin-tazobactam (ZOSYN) IVPB 3.375 g  Status:  Discontinued        3.375 g 12.5 mL/hr over 240 Minutes Intravenous Every 8 hours 09/12/21 0858 09/13/21 0622   09/11/21 2200  hydroxychloroquine (PLAQUENIL) tablet 200 mg        200 mg Oral 2 times daily 09/11/21 1758          Subjective: Seen and examined at bedside and based that he felt well and states that he slept very well last night.  He denies any chest pain or shortness of breath.  No nausea or vomiting.  No other concerns or complaints  this time.  Objective: Vitals:   09/17/21 2339 09/18/21 0601 09/18/21 0645 09/18/21 0837  BP: (!) 154/60 (!) 164/64 (!) 148/61 (!) 138/51  Pulse: 71 72 74 70  Resp:  20  18  Temp: 98.4 F (36.9 C) 97.7 F (36.5 C)  97.8 F (36.6 C)  TempSrc: Oral Oral    SpO2: 96% 97%  93%  Weight:        Intake/Output Summary (Last 24 hours) at 09/18/2021 1143 Last data filed at 09/18/2021 0600 Gross per 24 hour  Intake 0 ml  Output 1200 ml  Net -1200 ml    Filed Weights   09/11/21 1219  Weight: 95 kg   Examination: Physical Exam:  Constitutional: WN/WD obese Caucasian male currently no acute distress appears calm and comfortable Eyes: Lids and conjunctivae normal, sclerae anicteric  ENMT: External Ears, Nose appear normal. Mildly hard of hearing Neck: Appears normal, supple, no cervical masses, normal ROM, no appreciable thyromegaly: No appreciable JVD Respiratory: Diminished to auscultation bilaterally with coarse breath sounds, no wheezing, rales, rhonchi or crackles. Normal respiratory effort and patient is not tachypenic. No accessory muscle use.  Unlabored breathing and is not wearing any supplemental oxygen via nasal cannula Cardiovascular: RRR, no murmurs / rubs / gallops. S1 and S2 auscultated.  Mild 1+ lower extremity edema Abdomen: Soft, non-tender, Distended 2/2 body habitus.  Bowel sounds positive.  GU: Deferred. Musculoskeletal: No clubbing / cyanosis of digits/nails. No joint deformity upper and lower extremities.  Skin: No rashes, lesions, ulcers on a limited skin evaluation but does have some bruising in the upper extremities. No induration; Warm and dry.  Neurologic: CN 2-12 grossly intact with no focal deficits. Romberg sign and cerebellar reflexes not assessed.  Psychiatric: Normal judgment and insight. Alert and oriented x 3. Normal mood and appropriate affect.   Data Reviewed: I have personally reviewed following labs and imaging studies  CBC: Recent Labs  Lab  09/12/21 0524 09/13/21 0457 09/14/21 0554 09/15/21 0913 09/16/21 0922 09/17/21 0902 09/18/21 1011  WBC 5.1   < > 3.3* 4.0 3.7* 4.6 5.1  NEUTROABS 4.6  --   --  2.8 2.2 2.8 3.1  HGB 9.1*   < > 8.3* 7.9* 8.1* 9.2* 9.0*  HCT 26.9*   < > 24.8* 22.6* 22.8* 26.0* 25.5*  MCV 105.9*   < > 102.9* 100.0 99.1 100.4* 101.2*  PLT 69*   < > 52* 58* 73* 110* 162   < > = values in this interval not displayed.    Basic Metabolic Panel: Recent Labs  Lab 09/14/21 0554 09/15/21 0845 09/15/21 0913 09/16/21 5427 09/17/21 0902 09/18/21  1011  NA 134* 134*  --  134* 138 138  K 3.7 3.3*  --  3.2* 3.8 3.8  CL 98 99  --  99 103 104  CO2 26 25  --  $R'26 25 27  'OK$ GLUCOSE 102* 104*  --  100* 111* 111*  BUN 37* 29*  --  $R'21 17 15  'vD$ CREATININE 1.66* 1.19  --  1.05 0.89 0.81  CALCIUM 7.7* 7.8*  --  7.9* 8.0* 7.8*  MG  --   --  2.0 2.1 1.9 1.9  PHOS  --  2.6  --  2.6 2.6 3.4    GFR: Estimated Creatinine Clearance: 75.8 mL/min (by C-G formula based on SCr of 0.81 mg/dL). Liver Function Tests: Recent Labs  Lab 09/11/21 1405 09/15/21 0845 09/15/21 0913 09/16/21 0922 09/17/21 0902 09/18/21 1011  AST 46*  --  $R'26 26 27 22  'Mt$ ALT 32  --  $R'20 23 23 19  'Nf$ ALKPHOS 48  --  38 40 43 43  BILITOT 1.0  --  0.5 0.5 0.6 0.6  PROT 6.6  --  4.8* 4.9* 5.3* 5.2*  ALBUMIN 3.6 2.3* 2.3* 2.4* 2.4* 2.3*    No results for input(s): LIPASE, AMYLASE in the last 168 hours. No results for input(s): AMMONIA in the last 168 hours. Coagulation Profile: Recent Labs  Lab 09/12/21 0524  INR 1.9*    Cardiac Enzymes: No results for input(s): CKTOTAL, CKMB, CKMBINDEX, TROPONINI in the last 168 hours. BNP (last 3 results) No results for input(s): PROBNP in the last 8760 hours. HbA1C: No results for input(s): HGBA1C in the last 72 hours. CBG: No results for input(s): GLUCAP in the last 168 hours. Lipid Profile: No results for input(s): CHOL, HDL, LDLCALC, TRIG, CHOLHDL, LDLDIRECT in the last 72 hours. Thyroid Function  Tests: No results for input(s): TSH, T4TOTAL, FREET4, T3FREE, THYROIDAB in the last 72 hours. Anemia Panel: No results for input(s): VITAMINB12, FOLATE, FERRITIN, TIBC, IRON, RETICCTPCT in the last 72 hours. Sepsis Labs: Recent Labs  Lab 09/11/21 1837  LATICACIDVEN 1.6    Recent Results (from the past 240 hour(s))  Resp Panel by RT-PCR (Flu A&B, Covid) Urine, Catheterized     Status: None   Collection Time: 09/11/21  3:41 PM   Specimen: Urine, Catheterized; Nasopharyngeal(NP) swabs in vial transport medium  Result Value Ref Range Status   SARS Coronavirus 2 by RT PCR NEGATIVE NEGATIVE Final    Comment: (NOTE) SARS-CoV-2 target nucleic acids are NOT DETECTED.  The SARS-CoV-2 RNA is generally detectable in upper respiratory specimens during the acute phase of infection. The lowest concentration of SARS-CoV-2 viral copies this assay can detect is 138 copies/mL. A negative result does not preclude SARS-Cov-2 infection and should not be used as the sole basis for treatment or other patient management decisions. A negative result may occur with  improper specimen collection/handling, submission of specimen other than nasopharyngeal swab, presence of viral mutation(s) within the areas targeted by this assay, and inadequate number of viral copies(<138 copies/mL). A negative result must be combined with clinical observations, patient history, and epidemiological information. The expected result is Negative.  Fact Sheet for Patients:  EntrepreneurPulse.com.au  Fact Sheet for Healthcare Providers:  IncredibleEmployment.be  This test is no t yet approved or cleared by the [place] FDA and  has been authorized for detection and/or diagnosis of SARS-CoV-2 by FDA under an Emergency Use Authorization (EUA). This EUA will remain  in effect (meaning this test can be used) for the duration  of the COVID-19 declaration under Section 564(b)(1) of the Act,  21 U.S.C.section 360bbb-3(b)(1), unless the authorization is terminated  or revoked sooner.       Influenza A by PCR NEGATIVE NEGATIVE Final   Influenza B by PCR NEGATIVE NEGATIVE Final    Comment: (NOTE) The Xpert Xpress SARS-CoV-2/FLU/RSV plus assay is intended as an aid in the diagnosis of influenza from Nasopharyngeal swab specimens and should not be used as a sole basis for treatment. Nasal washings and aspirates are unacceptable for Xpert Xpress SARS-CoV-2/FLU/RSV testing.  Fact Sheet for Patients: EntrepreneurPulse.com.au  Fact Sheet for Healthcare Providers: IncredibleEmployment.be  This test is not yet approved or cleared by the [place] FDA and has been authorized for detection and/or diagnosis of SARS-CoV-2 by FDA under an Emergency Use Authorization (EUA). This EUA will remain in effect (meaning this test can be used) for the duration of the COVID-19 declaration under Section 564(b)(1) of the Act, 21 U.S.C. section 360bbb-3(b)(1), unless the authorization is terminated or revoked.  Performed at [facility], [address] [place], [place] [address]   CULTURE, BLOOD (ROUTINE X 2) w Reflex to ID Panel     Status: Abnormal   Collection Time: 09/12/21 11:19 AM   Specimen: BLOOD  Result Value Ref Range Status   Specimen Description   Final    BLOOD LEFT HABD Performed at [facility], [address] [place], [place] [address]    Special Requests   Final    BOTTLES DRAWN AEROBIC AND ANAEROBIC Blood Culture adequate volume Performed at [facility], [address], [place], [place] [address]    Culture  Setup Time   Final    GRAM NEGATIVE RODS ANAEROBIC BOTTLE ONLY CRITICAL RESULT CALLED TO, READ BACK BY AND VERIFIED WITH: JASON ROBBINS AT 0500 09/13/21.PMF Performed at [facility], [place] [address] [place], [place] [address]    Culture ESCHERICHIA COLI (A)  Final   Report Status  09/15/2021 FINAL  Final   Organism ID, Bacteria ESCHERICHIA COLI  Final      Susceptibility   Escherichia coli - MIC*    AMPICILLIN >=32 RESISTANT Resistant     CEFAZOLIN 8 SENSITIVE Sensitive     CEFEPIME <=0.12 SENSITIVE Sensitive     CEFTAZIDIME <=1 SENSITIVE Sensitive     CEFTRIAXONE <=0.25 SENSITIVE Sensitive     CIPROFLOXACIN <=0.25 SENSITIVE Sensitive     GENTAMICIN >=16 RESISTANT Resistant     IMIPENEM <=0.25 SENSITIVE Sensitive     TRIMETH/SULFA >=320 RESISTANT Resistant     AMPICILLIN/SULBACTAM >=32 RESISTANT Resistant     PIP/TAZO <=4 SENSITIVE Sensitive     * ESCHERICHIA COLI  CULTURE, BLOOD (ROUTINE X 2) w Reflex to ID Panel     Status: None   Collection Time: 09/12/21 11:19 AM   Specimen: BLOOD  Result Value Ref Range Status   Specimen Description BLOOD RIGHT HAND  Final   Special Requests   Final    BOTTLES DRAWN AEROBIC AND ANAEROBIC Blood Culture adequate volume   Culture   Final    NO GROWTH 5 DAYS Performed at [facility], [address], [place], [place] [address]    Report Status 09/17/2021 FINAL  Final  Urine Culture     Status: Abnormal   Collection Time: 09/12/21 11:19 AM   Specimen: Urine, Random  Result Value Ref Range Status   Specimen Description   Final    URINE, RANDOM Performed at [facility], [address] [place], [place] [address]    Special Requests   Final    Normal Performed at [facility], [address] [place], [place] [address]    Culture MULTIPLE SPECIES PRESENT, SUGGEST RECOLLECTION (A)  Final   Report Status 09/13/2021 FINAL  Final  Blood Culture ID Panel (Reflexed)     Status: Abnormal   Collection Time: 09/12/21 11:19 AM  Result Value Ref Range Status   Enterococcus faecalis NOT DETECTED NOT DETECTED Final   Enterococcus Faecium NOT DETECTED NOT DETECTED Final   Listeria monocytogenes NOT DETECTED NOT DETECTED Final   Staphylococcus species NOT DETECTED NOT DETECTED Final    Staphylococcus aureus (BCID) NOT DETECTED NOT DETECTED Final   Staphylococcus epidermidis NOT DETECTED NOT DETECTED Final   Staphylococcus lugdunensis NOT DETECTED NOT DETECTED Final   Streptococcus species NOT DETECTED NOT DETECTED Final   Streptococcus agalactiae NOT DETECTED NOT DETECTED Final   Streptococcus pneumoniae NOT DETECTED NOT DETECTED Final   Streptococcus pyogenes NOT DETECTED NOT DETECTED Final   A.calcoaceticus-baumannii NOT DETECTED NOT DETECTED Final   Bacteroides fragilis NOT DETECTED NOT DETECTED Final   Enterobacterales DETECTED (A) NOT DETECTED Final    Comment: Enterobacterales represent a large order of gram negative bacteria, not a single organism. CRITICAL RESULT CALLED TO, READ BACK BY AND VERIFIED WITH: JASON ROBBINS AT 0500 09/13/21.PMF    Enterobacter cloacae complex NOT DETECTED NOT DETECTED Final   Escherichia coli DETECTED (A) NOT DETECTED Final    Comment: CRITICAL RESULT CALLED TO, READ BACK BY AND VERIFIED WITH: JASON ROBBINS AT 0500 09/13/21.PMF    Klebsiella aerogenes NOT DETECTED NOT DETECTED Final   Klebsiella oxytoca NOT DETECTED NOT DETECTED Final   Klebsiella pneumoniae NOT DETECTED NOT DETECTED Final   Proteus species NOT DETECTED NOT DETECTED Final   Salmonella species NOT DETECTED NOT DETECTED Final   Serratia marcescens NOT DETECTED NOT DETECTED Final   Haemophilus influenzae NOT DETECTED NOT DETECTED Final   Neisseria meningitidis NOT DETECTED NOT DETECTED Final   Pseudomonas aeruginosa NOT DETECTED NOT DETECTED Final   Stenotrophomonas maltophilia NOT DETECTED NOT DETECTED Final   Candida albicans NOT DETECTED NOT DETECTED Final   Candida auris NOT DETECTED NOT DETECTED Final   Candida glabrata NOT DETECTED NOT DETECTED Final   Candida krusei NOT DETECTED NOT DETECTED Final   Candida parapsilosis NOT DETECTED NOT DETECTED Final   Candida tropicalis NOT DETECTED NOT DETECTED Final   Cryptococcus neoformans/gattii NOT DETECTED NOT  DETECTED Final   CTX-M ESBL NOT DETECTED NOT DETECTED Final   Carbapenem resistance IMP NOT DETECTED NOT DETECTED Final   Carbapenem resistance KPC NOT DETECTED NOT DETECTED Final   Carbapenem resistance NDM NOT DETECTED NOT DETECTED Final   Carbapenem resist OXA 48 LIKE NOT DETECTED NOT DETECTED Final   Carbapenem resistance VIM NOT DETECTED NOT DETECTED Final    Comment: Performed at [facility], [address] [place], [place] [address]  Resp Panel by RT-PCR (Flu A&B, Covid) Nasopharyngeal Swab     Status: Abnormal   Collection Time: 09/17/21 11:10 AM   Specimen: Nasopharyngeal Swab; Nasopharyngeal(NP) swabs in vial transport medium  Result Value Ref Range Status   SARS Coronavirus 2 by RT PCR POSITIVE (A) NEGATIVE Final    Comment: RESULT CALLED TO, READ BACK BY AND VERIFIED WITH: LINDSAY SHIFFLIT 09/17/21 1206 KLW (NOTE) SARS-CoV-2 target nucleic acids are DETECTED.  The SARS-CoV-2 RNA is generally detectable in upper respiratory specimens during the acute phase of infection. Positive results are  indicative of the presence of the identified virus, but do not rule out bacterial infection or co-infection with other pathogens not detected by the test. Clinical correlation with patient history and other diagnostic information is necessary to determine patient infection status. The expected result is Negative.  Fact Sheet for Patients: BloggerCourse.com  Fact Sheet for Healthcare Providers: SeriousBroker.it  This test is not yet approved or cleared by the [place] FDA and  has been authorized for detection and/or diagnosis of SARS-CoV-2 by FDA under an Emergency Use Authorization (EUA).  This EUA will remain in effect (meaning this test can b e used) for the duration of  the COVID-19 declaration under Section 564(b)(1) of the Act, 21 U.S.C. section 360bbb-3(b)(1), unless the authorization is terminated or revoked  sooner.     Influenza A by PCR NEGATIVE NEGATIVE Final   Influenza B by PCR NEGATIVE NEGATIVE Final    Comment: (NOTE) The Xpert Xpress SARS-CoV-2/FLU/RSV plus assay is intended as an aid in the diagnosis of influenza from Nasopharyngeal swab specimens and should not be used as a sole basis for treatment. Nasal washings and aspirates are unacceptable for Xpert Xpress SARS-CoV-2/FLU/RSV testing.  Fact Sheet for Patients: BloggerCourse.com  Fact Sheet for Healthcare Providers: SeriousBroker.it  This test is not yet approved or cleared by the [place] FDA and has been authorized for detection and/or diagnosis of SARS-CoV-2 by FDA under an Emergency Use Authorization (EUA). This EUA will remain in effect (meaning this test can be used) for the duration of the COVID-19 declaration under Section 564(b)(1) of the Act, 21 U.S.C. section 360bbb-3(b)(1), unless the authorization is terminated or revoked.  Performed at [facility], [address] [place], [place] [address]     RN Pressure Injury Documentation:     Estimated body mass index is 30.93 kg/m as calculated from the following:   Height as of 05/28/21: 5\' 9"  (1.753 m).   Weight as of this encounter: 95 kg.  Malnutrition Type:   Malnutrition Characteristics:   Nutrition Interventions:   Radiology Studies: DG Chest Port 1 View  Result Date: 09/18/2021 CLINICAL DATA:  Coronavirus infection. EXAM: PORTABLE CHEST 1 VIEW COMPARISON:  09/11/2021 FINDINGS: Cardiomegaly as seen previously. Chronic aortic atherosclerotic calcification. Patchy density in both lower lobes, left more than right, consistent with atelectasis and or mild basilar pneumonia. No lobar consolidation or collapse. There could possibly be a small amount of pleural fluid on the left. IMPRESSION: Patchy density in the lung bases left more than right consistent with atelectasis or mild basilar  pneumonia. Similar or slightly worsened compared to the study of 09/11/2021. Electronically Signed   By: 09/13/2021 M.D.   On: 09/18/2021 10:43    Scheduled Meds:  ALPRAZolam  1 mg Oral QHS   apixaban  2.5 mg Oral BID   Chlorhexidine Gluconate Cloth  6 each Topical Daily   feeding supplement  1 Container Oral TID BM   fluticasone  2 spray Each Nare BID   folic acid  1,000 mcg Oral Daily   gabapentin  300 mg Oral BID AC & HS   hydroxychloroquine  200 mg Oral BID   levofloxacin  750 mg Oral Daily   metoprolol succinate  12.5 mg Oral QHS   mirabegron ER  50 mg Oral Daily   pantoprazole  40 mg Oral Daily   polyethylene glycol  17 g Oral QHS   pravastatin  20 mg Oral QHS   vitamin B-12  1,000 mcg Oral Daily  Continuous Infusions:    LOS: 7 days   Kerney Elbe, DO [facility] PAGER is on AMION  If 7PM-7AM, please contact night-coverage [email or web address]

## 2021-09-18 NOTE — Progress Notes (Signed)
[facility]  PROGRESS NOTE   Subjective:   Patient seen at bedside. Comfortable.  Objective:  Vital signs in last 24 hours:  Temp:  [97.7 F (36.5 C)-98.4 F (36.9 C)] 97.8 F (36.6 C) (10/22 0837) Pulse Rate:  [70-85] 70 (10/22 0837) Resp:  [15-20] 18 (10/22 0837) BP: (132-164)/(51-64) 138/51 (10/22 0837) SpO2:  [93 %-97 %] 93 % (10/22 0837)  Weight change:  Filed Weights   09/11/21 1219  Weight: 95 kg    Intake/Output: I/O last 3 completed shifts: In: 440 [P.O.:240; IV Piggyback:200] Out: 6767 [Urine:1375]   Intake/Output this shift:  Total I/O In: 480 [P.O.:480] Out: -   Physical Exam: General:  No acute distress  Head:  Normocephalic, atraumatic. Moist oral mucosal membranes  Eyes:  Anicteric  Neck:  Supple  Lungs:   Clear to auscultation, normal effort  Heart:  S1S2 no rubs  Abdomen:   Soft, nontender, bowel sounds present  Extremities:  peripheral edema.  Neurologic:  Awake, alert, following commands  Skin:  No lesions  Access:     Basic Metabolic Panel: Recent Labs  Lab 09/14/21 0554 09/15/21 0845 09/15/21 0913 09/16/21 0922 09/17/21 0902 09/18/21 1011  NA 134* 134*  --  134* 138 138  K 3.7 3.3*  --  3.2* 3.8 3.8  CL 98 99  --  99 103 104  CO2 26 25  --  26 25 27   GLUCOSE 102* 104*  --  100* 111* 111*  BUN 37* 29*  --  21 17 15   CREATININE 1.66* 1.19  --  1.05 0.89 0.81  CALCIUM 7.7* 7.8*  --  7.9* 8.0* 7.8*  MG  --   --  2.0 2.1 1.9 1.9  PHOS  --  2.6  --  2.6 2.6 3.4    CBC: Recent Labs  Lab 09/12/21 0524 09/13/21 0457 09/14/21 0554 09/15/21 0913 09/16/21 0922 09/17/21 0902 09/18/21 1011  WBC 5.1   < > 3.3* 4.0 3.7* 4.6 5.1  NEUTROABS 4.6  --   --  2.8 2.2 2.8 3.1  HGB 9.1*   < > 8.3* 7.9* 8.1* 9.2* 9.0*  HCT 26.9*   < > 24.8* 22.6* 22.8* 26.0* 25.5*  MCV 105.9*   < > 102.9* 100.0 99.1 100.4* 101.2*  PLT 69*   < > 52* 58* 73* 110* 162   < > = values in this interval not displayed.     Urinalysis: No  results for input(s): COLORURINE, LABSPEC, PHURINE, GLUCOSEU, HGBUR, BILIRUBINUR, KETONESUR, PROTEINUR, UROBILINOGEN, NITRITE, LEUKOCYTESUR in the last 72 hours.  Invalid input(s): APPERANCEUR    Imaging: DG Chest Port 1 View  Result Date: 09/18/2021 CLINICAL DATA:  Coronavirus infection. EXAM: PORTABLE CHEST 1 VIEW COMPARISON:  09/11/2021 FINDINGS: Cardiomegaly as seen previously. Chronic aortic atherosclerotic calcification. Patchy density in both lower lobes, left more than right, consistent with atelectasis and or mild basilar pneumonia. No lobar consolidation or collapse. There could possibly be a small amount of pleural fluid on the left. IMPRESSION: Patchy density in the lung bases left more than right consistent with atelectasis or mild basilar pneumonia. Similar or slightly worsened compared to the study of 09/11/2021. Electronically Signed   By: Nelson Chimes M.D.   On: 09/18/2021 10:43     Medications:    remdesivir 200 mg in sodium chloride 0.9% 250 mL IVPB     Followed by   Derrill Memo ON 09/19/2021] remdesivir 100 mg in NS 100 mL      ALPRAZolam  1 mg Oral QHS   apixaban  2.5 mg Oral BID   Chlorhexidine Gluconate Cloth  6 each Topical Daily   feeding supplement  1 Container Oral TID BM   fluticasone  2 spray Each Nare BID   folic acid  0,102 mcg Oral Daily   gabapentin  300 mg Oral BID AC & HS   hydroxychloroquine  200 mg Oral BID   levofloxacin  750 mg Oral Daily   metoprolol succinate  12.5 mg Oral QHS   mirabegron ER  50 mg Oral Daily   pantoprazole  40 mg Oral Daily   polyethylene glycol  17 g Oral QHS   pravastatin  20 mg Oral QHS   vitamin B-12  1,000 mcg Oral Daily    Assessment/ Plan:     Active Problems:   Fever   AF (paroxysmal atrial fibrillation) (HCC)   Pancytopenia (HCC)   Acute kidney injury superimposed on CKD (HCC)   E coli bacteremia  87 year old male with history of hypertension, coronary artery disease, atrial fibrillation, congestive heart  failure, history of nephrectomy and an indwelling Foley catheter now admitted for positive COVID test.  He was found to have acute kidney injury.  #1: Acute kidney injury: Acute kidney injury most likely was secondary to overdiuresis on the top of sepsis leading to ATN.  Renal indicis are presently close to normal levels.  Diuretics were on hold.  #2: COVID-pneumonia: Patient is on remdesivir  #3: Hypertension: Blood pressure is well controlled  #4: Congestive heart failure: Presently stable.  We will continue fluid restriction at this time.    LOS: [place], [facility] 10/22/20221:44 PM

## 2021-09-18 NOTE — Progress Notes (Signed)
Physical Therapy Treatment Patient Details Name: [name] MRN: [id number] DOB: 08-07-1934 Today's Date: 09/18/2021   History of Present Illness [name]  is a 87 y.o. male with a known history of chronic diastolic congestive heart failure, indwelling Foley catheter, rheumatoid arthritis, chronic hyponatremia, chronic kidney disease. He has been exhausted lately and having trouble sleeping, and has been weak and struggling to get up and down. In the ER he was hypotensive with a blood pressure of 94/37 on 1 occasion.  He was dehydrated with a BUN of 36 and a creatinine up to 2.28.    PT Comments    Pt seen for pt tx with session focusing on bed mobility & transfers. Pt continues to require max<>total assist for supine<>sit (+2 assist to scoot to [name]) and max assist for sit>stand transfers. Pt requires multiple attempts to successfully achieve sit>stand but then is able to stand x 2 attempts for max duration of 60 seconds. Pt continues to demonstrate poor ability to weight shift L<>R to take steps, with minimal ability to slide feet along floor. Pt also incontinent of BM & unaware during session. Continue to recommend STR upon d/c to maximize independence with functional mobility & reduce fall risk prior to return home.    Recommendations for follow up therapy are one component of a multi-disciplinary discharge planning process, led by the attending physician.  Recommendations may be updated based on patient status, additional functional criteria and insurance authorization.  Follow Up Recommendations  SNF     Equipment Recommendations  None recommended by PT    Recommendations for Other Services       Precautions / Restrictions Precautions Precautions: Fall Restrictions Weight Bearing Restrictions: No     Mobility  Bed Mobility   Bed Mobility: Supine to Sit;Sit to Supine     Supine to sit: Max assist;Total assist Sit to supine: Max assist;Total assist   General bed  mobility comments: Pt requires max<>total for supine<>sit with pt somewhat initiating moving BLE to EOB.    Transfers Overall transfer level: Needs assistance Equipment used: Rolling walker (2 wheeled) Transfers: Sit to/from Stand Sit to Stand: Max assist;From elevated surface         General transfer comment: 3 attempts to achieve sit>stand then able to stand 2 times. Pt demonstrates poor awareness of safe hand placement as he pulls upon RW, PT provides max cuing & assist for anterior weight shift & correct posterior lean. Pt unable to fully shift pelvis anteriorly & achieve full upright posture.  Ambulation/Gait             General Gait Details: Provided cuing for side stepping to L at EOB but pt only able to slide feet minimally across floor, poor ability to weight shift L<>R to allow increased ease of moving feet & poor understanding of cuing.   Stairs             Wheelchair Mobility    Modified Rankin (Stroke Patients Only)       Balance Overall balance assessment: Needs assistance Sitting-balance support: Feet supported;Bilateral upper extremity supported Sitting balance-[name]: Fair     Standing balance support: Bilateral upper extremity supported;During functional activity Standing balance-[name]: Poor Standing balance comment: Pt requires max assist to maintain static standing, tolerates standing x 60 seconds at most. Brief episode of standing with supervision with BUE support on RW (likely BLE leaning posteriorly onto bed for support).  Cognition Arousal/Alertness: Awake/alert Behavior During Therapy: Flat affect Overall Cognitive Status: Within Functional Limits for tasks assessed                                 General Comments: Tangential conversation at slow pace.      Exercises      General Comments General comments (skin integrity, edema, etc.): Pt c/o dizziness after  transferring sit>supine but reports it goes away quickly. PT provides dependent assist for peri hygiene as pt is unaware of incontinent BM upon standing.      Pertinent Vitals/Pain Pain Assessment: Faces Faces Pain Scale: Hurts a little bit Pain Location: BLE when PT touches feet Pain Descriptors / Indicators: Grimacing;Discomfort Pain Intervention(s): Repositioned;Monitored during session;Limited activity within patient's tolerance    Home Living                      Prior Function            PT Goals (current goals can now be found in the care plan section) Acute Rehab PT Goals Patient Stated Goal: to return home with family PT Goal Formulation: With patient Time For Goal Achievement: 09/26/21 Potential to Achieve Goals: Fair Progress towards PT goals: Progressing toward goals    Frequency    Min 2X/week      PT Plan Current plan remains appropriate    Co-evaluation              AM-PAC PT "6 Clicks" Mobility   Outcome Measure  Help needed turning from your back to your side while in a flat bed without using bedrails?: Total Help needed moving from lying on your back to sitting on the side of a flat bed without using bedrails?: Total Help needed moving to and from a bed to a chair (including a wheelchair)?: Total Help needed standing up from a chair using your arms (e.g., wheelchair or bedside chair)?: Total Help needed to walk in hospital room?: Total Help needed climbing 3-5 steps with a railing? : Total 6 Click Score: 6    End of Session Equipment Utilized During Treatment: Gait belt Activity Tolerance: Patient tolerated treatment well Patient left: in bed;with call bell/phone within reach;with bed alarm set (pt requests not to sit in chair 2/2 it being uncomfortable)   PT Visit Diagnosis: Unsteadiness on feet (R26.81);Other abnormalities of gait and mobility (R26.89);Muscle weakness (generalized) (M62.81)     Time: 3491-7915 PT Time  Calculation (min) (ACUTE ONLY): 30 min  Charges:  $Therapeutic Activity: 23-37 mins                     Lavone Nian, PT, DPT 09/18/21, 12:58 PM    Waunita Schooner 09/18/2021, 12:56 PM

## 2021-09-18 NOTE — Consult Note (Signed)
Remdesivir - Pharmacy Brief Note   O:  ALT: 19 CXR: Patchy density in the lung bases left more than right consistent with atelectasis or mild basilar pneumonia SpO2: 93% on RA   A/P:  Remdesivir 200 mg IVPB once followed by 100 mg IVPB daily x 2 days per ED provider.   Narda Rutherford, PharmD Pharmacy Resident  09/18/2021 12:03 PM

## 2021-09-19 DIAGNOSIS — U071 COVID-19: Secondary | ICD-10-CM | POA: Diagnosis not present

## 2021-09-19 DIAGNOSIS — R7881 Bacteremia: Secondary | ICD-10-CM | POA: Diagnosis not present

## 2021-09-19 DIAGNOSIS — B9620 Unspecified Escherichia coli [E. coli] as the cause of diseases classified elsewhere: Secondary | ICD-10-CM | POA: Diagnosis not present

## 2021-09-19 NOTE — Telephone Encounter (Signed)
That's too bad----maybe he can be ready to go right home by then (though they won't really do PT with him if he is COVID positive)

## 2021-09-19 NOTE — Progress Notes (Addendum)
PROGRESS NOTE    [name]   VXB:[id number]  DOB: 08/17/1934  PCP: Viviana Simpler I, MD    DOA: 09/11/2021 LOS: 8    Brief Narrative / Hospital Course to Date:   Per Dr. Alfredia Ferguson: "The patient is an 87 year old Caucasian male with a past medical history significant for 2 right renal nephrectomy given renal mass, chronic hyponatremia, chronic indwelling Foley catheter, chronic diastolic CHF, essential hypertension, persistent atrial fibrillation as well as other comorbidities who presented with hypotension initially.  Initial urinalysis showed blood but nitrates and leuk esterase was negative.  Initially antibiotics were held off but he spiked a temperature of 103 and blood cultures were drawn and E. coli was shown growing out of 1 out of 4 bottles.  His Foley catheter was changed out by urology and he is placed on IV ceftriaxone.  Nephrology been following given his AKI on admission and his IV fluids have now been stopped.  The E. coli bacteremia sensitivities came back and he is sensitive to cefazolin, Zosyn, imipenem, ciprofloxacin, and ceftriaxone which we will continue while hospitalized and change to po Levofloxacin at D/C.  We will obtain PT OT for further evaluate and treat and they are recommending SNF. His Renal Fxn is improved and he was stable to D/C to SNF however prior to D/C to SNF he tested Positive for COVID 19. His Inflammatory markers are elevated but He is asymptomatic and will need Isolation for 10 Days and can go to SNF on Sep 28, 2021. "  Assessment & Plan   Principal Problem:   E coli bacteremia Active Problems:   GAD (generalized anxiety disorder)   Coronary atherosclerosis of native coronary artery   IBS   Neurogenic bladder   Hyperlipidemia   Chronic constipation   Rheumatoid arthritis involving multiple sites with positive rheumatoid factor (HCC)   Chronic pain syndrome   DDD (degenerative disc disease), lumbosacral   COVID-19 virus infection   Fever   AF  (paroxysmal atrial fibrillation) (HCC)   Pancytopenia (HCC)   Acute kidney injury superimposed on CKD (HCC)   E. coli bacteremia in the setting of UTI and Indwelling Foley  -Had a episode of hypotension when he came in and then spiked a temperature of 103 on 09/13/2019 -And a temperature of 12:101 the night before.  He is continuing on IV ceftriaxone and sensitivities showing that he is sensitive to cefazolin, Zosyn, imipenem, ciprofloxacin and ceftriaxone. -Foley catheter was changed -Urine culture showed multiple organisms -Continue empiric antibiotics with IV Ceftriaxone and monitor clinical response to intervention; Change to po Levofloxacin at D/C for 3 days with end date on 09/20/21 -PT OT evaluated and recommending SNF and awaiting placement and Insurance Authorization   COVID-19 + -Incidental Finding as he was swabbed for SNF and tested positive -Asymptomatic -Checked Inflammatory Markers and Trend -Airborne and Contact Precautions -Will add Anti-tussives with Tussionex and Robitussin DM -Will add Combivent 1 puff IH q6hprn -Prone if Able -Vitamin C and Zinc  -Will give 3 Days of Remdesivir given No Symptoms; Hold off on Steroids currently -Checked CXR and showed "Patchy density in the lung bases left more than right consistent with atelectasis or mild basilar  pneumonia. Similar or slightly worsened compared to the study of 09/11/2021." -He is going to be placed in Isolation now for 10 Days  -Not Hypoxic and has no cough or Respiratory Sx   AKI on CKD stage IIIa Hx of Right Renal Nephrectomy  Chronic Indwelling Foley  -Improving  creatinine 2.2 >>1.19>>1.05>>0.89>>0.81 -Baseline creatinine is around 1.1-1.3 and in the setting of his right renal nephrectomy -Avoid further nephrotoxic medications, contrast dyes, hypotension, renally dose medications -Repeat CMP in the a.m.  -Nephrology consulted, recommended no further IV fluids -Follow up with Nephrology within 1 week     Hyponatremia - resolved. Mild. Sodium is now 134 -> 138 x2 -Continue to monitor and trend and repeat CMP within 1 week    Hypokalemia - resolved with replacement -Continue monitor K & Mg, replete as necessary -Repeat CMP within 1 week    Macrocytic anemia/anemia of chronic disease -Patient's Hbg/Hct was 11.1/33.3, likely hemoconcentrated on admission --H/H trended down to 7.9/22.6 on 10/19, improving since --Hbg today 9.0 -Continue to monitor for signs and symptoms of bleeding; currently no overt bleeding noted -Check anemia panel in the outpatient setting  -Repeat CBC in a.m.   Thrombocytopenia - likely due to infection. Resolved. -Platelet count 82 >> trended down to 52 and improving and normal. -Monitor CBC   Paroxysmal atrial fibrillation -On low-dose Eliquis at 2.5 mg p.o. BID -Restarted on his Toprol succinate 12.5 g p.o. daily. -Follow up with Cardiology as an outpatient    Rheumatoid arthritis - Continue Plaquenil 200 mg po BID   Hyperlipidemia - Continue Pravastatin 20 mg p.o. nightly   Neuropathy - dose adjusted Gabapentin 300 mg p.o. twice daily   Pancytopenia -  -Pathology is reviewed and showed giant platelets and no schistocytes -Continue monitor CBCs  -Leukopenia has now resolved -Repeat CBC within 1 week   Chronic Diastolic CHF - appears compensated and euvolemic. -Echo in June 2021: EF of 60 to 65% -Nephrology recommending continue to hold diuretics at this time given his AKI in the setting of his overdiuresis with poor appetite -Monitor volume status: I/O's & daily weights Net IO Since Admission: -2,989.07 mL [09/19/21 1542] -Follow up with Cardiology as an outpatient and resume Diuretics in the outpatient setting    Asthma - takes Xolair every 14 days, next due Monday 10/24  --Follow up with pulmonology to reschedule, given infections  Obesity: Body mass index is 30.93 kg/m.  Complicates overall care and prognosis.  Recommend lifestyle  modifications including physical activity and diet for weight loss and overall long-term health.   DVT prophylaxis: Place TED hose Start: 09/12/21 0907 apixaban (ELIQUIS) tablet 2.5 mg Start: 09/11/21 2200 apixaban (ELIQUIS) tablet 2.5 mg   Diet:  Diet Orders (From admission, onward)     Start     Ordered   09/17/21 0000  Diet - low sodium heart healthy        09/17/21 1158   09/11/21 1755  Diet heart healthy/carb modified Room service appropriate? Yes; Fluid consistency: Thin  Diet effective now       Question Answer Comment  Diet-HS Snack? Nothing   Room service appropriate? Yes   Fluid consistency: Thin      09/11/21 1755              Code Status: Full Code   Subjective 09/19/21    Pt seen awake sitting up in bed.  Reports he feels well overall.  Denies any covid symptoms, fever or chills.  Reports his appetite remains poor but no other acute complaints.     Disposition Plan & Communication   Status is: Inpatient  Remains inpatient appropriate because: Covid+ requiring isolation prior to SNF placement, now delayed to Nov 1.   Family Communication: spoke with daughter [name] by phone this afternoon.    Consults,  Procedures, Significant Events   Consultants:  Nephrology  Procedures:  None  Antimicrobials:  Anti-infectives (From admission, onward)    Start     Dose/Rate Route Frequency Ordered Stop   09/19/21 1000  remdesivir 200 mg in sodium chloride 0.9% 250 mL IVPB  Status:  Discontinued       See Hyperspace for full Linked Orders Report.   100 mg 540 mL/hr over 30 Minutes Intravenous Daily 09/18/21 1202 09/18/21 1224   09/19/21 1000  remdesivir 100 mg in sodium chloride 0.9 % 100 mL IVPB       See Hyperspace for full Linked Orders Report.   100 mg 200 mL/hr over 30 Minutes Intravenous Daily 09/18/21 1224 09/21/21 0959   09/18/21 1315  remdesivir 200 mg in sodium chloride 0.9% 250 mL IVPB       See Hyperspace for full Linked Orders Report.   200  mg 580 mL/hr over 30 Minutes Intravenous Once 09/18/21 1224 09/18/21 1541   09/18/21 1300  remdesivir 200 mg in sodium chloride 0.9% 250 mL IVPB  Status:  Discontinued       See Hyperspace for full Linked Orders Report.   200 mg 580 mL/hr over 30 Minutes Intravenous Once 09/18/21 1202 09/18/21 1224   09/18/21 1000  levofloxacin (LEVAQUIN) tablet 750 mg        750 mg Oral Daily 09/17/21 1041 09/21/21 0959   09/18/21 0000  levofloxacin (LEVAQUIN) 750 MG tablet        750 mg Oral Daily 09/17/21 1158 09/21/21 2359   09/13/21 1000  cefTRIAXone (ROCEPHIN) 2 g in sodium chloride 0.9 % 100 mL IVPB  Status:  Discontinued        2 g 200 mL/hr over 30 Minutes Intravenous Every 24 hours 09/13/21 0624 09/17/21 1041   09/12/21 1000  piperacillin-tazobactam (ZOSYN) IVPB 3.375 g  Status:  Discontinued        3.375 g 12.5 mL/hr over 240 Minutes Intravenous Every 8 hours 09/12/21 0858 09/13/21 0622   09/11/21 2200  hydroxychloroquine (PLAQUENIL) tablet 200 mg        200 mg Oral 2 times daily 09/11/21 1758           Micro    Objective   Vitals:   09/18/21 1614 09/18/21 2102 09/19/21 0614 09/19/21 0830  BP: (!) 143/63 (!) 145/58 (!) 152/70 (!) 144/57  Pulse: 71 71 82 82  Resp: 16 18 18 18   Temp: 97.6 F (36.4 C) 98.3 F (36.8 C) 97.8 F (36.6 C) 97.8 F (36.6 C)  TempSrc:    Oral  SpO2: 95% 97% 94% 96%  Weight:        Intake/Output Summary (Last 24 hours) at 09/19/2021 1534 Last data filed at 09/19/2021 1117 Gross per 24 hour  Intake --  Output 1750 ml  Net -1750 ml   Filed Weights   09/11/21 1219  Weight: 95 kg    Physical Exam:  General exam: awake, alert, no acute distress HEENT: atraumatic, clear conjunctiva, anicteric sclera, moist mucus membranes, hearing grossly normal  Respiratory system: CTAB, no wheezes, rales or rhonchi, normal respiratory effort. Cardiovascular system: normal S1/S2, RRR, no pedal edema.   Gastrointestinal system: soft, non-tender abdomen Central  nervous system: A&O x4. no gross focal neurologic deficits, normal speech Genitourinary: indwelling Foley in place Skin: dry, intact, normal temperature Psychiatry: normal mood, congruent affect, judgement and insight appear normal  Labs   Data Reviewed: I have personally reviewed following labs and imaging studies  CBC:  Recent Labs  Lab 09/14/21 0554 09/15/21 0913 09/16/21 0922 09/17/21 0902 09/18/21 1011  WBC 3.3* 4.0 3.7* 4.6 5.1  NEUTROABS  --  2.8 2.2 2.8 3.1  HGB 8.3* 7.9* 8.1* 9.2* 9.0*  HCT 24.8* 22.6* 22.8* 26.0* 25.5*  MCV 102.9* 100.0 99.1 100.4* 101.2*  PLT 52* 58* 73* 110* 539   Basic Metabolic Panel: Recent Labs  Lab 09/14/21 0554 09/15/21 0845 09/15/21 0913 09/16/21 0922 09/17/21 0902 09/18/21 1011  NA 134* 134*  --  134* 138 138  K 3.7 3.3*  --  3.2* 3.8 3.8  CL 98 99  --  99 103 104  CO2 26 25  --  26 25 27   GLUCOSE 102* 104*  --  100* 111* 111*  BUN 37* 29*  --  21 17 15   CREATININE 1.66* 1.19  --  1.05 0.89 0.81  CALCIUM 7.7* 7.8*  --  7.9* 8.0* 7.8*  MG  --   --  2.0 2.1 1.9 1.9  PHOS  --  2.6  --  2.6 2.6 3.4   GFR: Estimated Creatinine Clearance: 75.8 mL/min (by C-G formula based on SCr of 0.81 mg/dL). Liver Function Tests: Recent Labs  Lab 09/15/21 0845 09/15/21 0913 09/16/21 0922 09/17/21 0902 09/18/21 1011  AST  --  26 26 27 22   ALT  --  20 23 23 19   ALKPHOS  --  38 40 43 43  BILITOT  --  0.5 0.5 0.6 0.6  PROT  --  4.8* 4.9* 5.3* 5.2*  ALBUMIN 2.3* 2.3* 2.4* 2.4* 2.3*   No results for input(s): LIPASE, AMYLASE in the last 168 hours. No results for input(s): AMMONIA in the last 168 hours. Coagulation Profile: No results for input(s): INR, PROTIME in the last 168 hours. Cardiac Enzymes: No results for input(s): CKTOTAL, CKMB, CKMBINDEX, TROPONINI in the last 168 hours. BNP (last 3 results) No results for input(s): PROBNP in the last 8760 hours. HbA1C: No results for input(s): HGBA1C in the last 72 hours. CBG: No results for  input(s): GLUCAP in the last 168 hours. Lipid Profile: No results for input(s): CHOL, HDL, LDLCALC, TRIG, CHOLHDL, LDLDIRECT in the last 72 hours. Thyroid Function Tests: No results for input(s): TSH, T4TOTAL, FREET4, T3FREE, THYROIDAB in the last 72 hours. Anemia Panel: Recent Labs    09/18/21 1011  FERRITIN 129   Sepsis Labs: No results for input(s): PROCALCITON, LATICACIDVEN in the last 168 hours.  Recent Results (from the past 240 hour(s))  Resp Panel by RT-PCR (Flu A&B, Covid) Urine, Catheterized     Status: None   Collection Time: 09/11/21  3:41 PM   Specimen: Urine, Catheterized; Nasopharyngeal(NP) swabs in vial transport medium  Result Value Ref Range Status   SARS Coronavirus 2 by RT PCR NEGATIVE NEGATIVE Final    Comment: (NOTE) SARS-CoV-2 target nucleic acids are NOT DETECTED.  The SARS-CoV-2 RNA is generally detectable in upper respiratory specimens during the acute phase of infection. The lowest concentration of SARS-CoV-2 viral copies this assay can detect is 138 copies/mL. A negative result does not preclude SARS-Cov-2 infection and should not be used as the sole basis for treatment or other patient management decisions. A negative result may occur with  improper specimen collection/handling, submission of specimen other than nasopharyngeal swab, presence of viral mutation(s) within the areas targeted by this assay, and inadequate number of viral copies(<138 copies/mL). A negative result must be combined with clinical observations, patient history, and epidemiological information. The expected result is Negative.  Fact Sheet for Patients:  EntrepreneurPulse.com.au  Fact Sheet for Healthcare Providers:  IncredibleEmployment.be  This test is no t yet approved or cleared by the [place] FDA and  has been authorized for detection and/or diagnosis of SARS-CoV-2 by FDA under an Emergency Use Authorization (EUA). This EUA  will remain  in effect (meaning this test can be used) for the duration of the COVID-19 declaration under Section 564(b)(1) of the Act, 21 U.S.C.section 360bbb-3(b)(1), unless the authorization is terminated  or revoked sooner.       Influenza A by PCR NEGATIVE NEGATIVE Final   Influenza B by PCR NEGATIVE NEGATIVE Final    Comment: (NOTE) The Xpert Xpress SARS-CoV-2/FLU/RSV plus assay is intended as an aid in the diagnosis of influenza from Nasopharyngeal swab specimens and should not be used as a sole basis for treatment. Nasal washings and aspirates are unacceptable for Xpert Xpress SARS-CoV-2/FLU/RSV testing.  Fact Sheet for Patients: EntrepreneurPulse.com.au  Fact Sheet for Healthcare Providers: IncredibleEmployment.be  This test is not yet approved or cleared by the [place] FDA and has been authorized for detection and/or diagnosis of SARS-CoV-2 by FDA under an Emergency Use Authorization (EUA). This EUA will remain in effect (meaning this test can be used) for the duration of the COVID-19 declaration under Section 564(b)(1) of the Act, 21 U.S.C. section 360bbb-3(b)(1), unless the authorization is terminated or revoked.  Performed at [facility], [address] [place], [place] [address]   CULTURE, BLOOD (ROUTINE X 2) w Reflex to ID Panel     Status: Abnormal   Collection Time: 09/12/21 11:19 AM   Specimen: BLOOD  Result Value Ref Range Status   Specimen Description   Final    BLOOD LEFT HABD Performed at [facility], [address], [place], [place] [address]    Special Requests   Final    BOTTLES DRAWN AEROBIC AND ANAEROBIC Blood Culture adequate volume Performed at [facility], [address], [place], [place] [address]    Culture  Setup Time   Final    GRAM NEGATIVE RODS ANAEROBIC BOTTLE ONLY CRITICAL RESULT CALLED TO, READ BACK BY AND VERIFIED WITH: JASON ROBBINS AT 0500  09/13/21.PMF Performed at [facility], [place] [address], [place], [place] [address]    Culture ESCHERICHIA COLI (A)  Final   Report Status 09/15/2021 FINAL  Final   Organism ID, Bacteria ESCHERICHIA COLI  Final      Susceptibility   Escherichia coli - MIC*    AMPICILLIN >=32 RESISTANT Resistant     CEFAZOLIN 8 SENSITIVE Sensitive     CEFEPIME <=0.12 SENSITIVE Sensitive     CEFTAZIDIME <=1 SENSITIVE Sensitive     CEFTRIAXONE <=0.25 SENSITIVE Sensitive     CIPROFLOXACIN <=0.25 SENSITIVE Sensitive     GENTAMICIN >=16 RESISTANT Resistant     IMIPENEM <=0.25 SENSITIVE Sensitive     TRIMETH/SULFA >=320 RESISTANT Resistant     AMPICILLIN/SULBACTAM >=32 RESISTANT Resistant     PIP/TAZO <=4 SENSITIVE Sensitive     * ESCHERICHIA COLI  CULTURE, BLOOD (ROUTINE X 2) w Reflex to ID Panel     Status: None   Collection Time: 09/12/21 11:19 AM   Specimen: BLOOD  Result Value Ref Range Status   Specimen Description BLOOD RIGHT HAND  Final   Special Requests   Final    BOTTLES DRAWN AEROBIC AND ANAEROBIC Blood Culture adequate volume   Culture   Final    NO GROWTH 5 DAYS Performed at [facility], [address] [place], [place] [address]    Report Status 09/17/2021 FINAL  Final  Urine Culture     Status: Abnormal   Collection Time: 09/12/21 11:19 AM   Specimen: Urine, Random  Result Value Ref Range Status   Specimen Description   Final    URINE, RANDOM Performed at [facility], [address] [place], [place] [address]    Special Requests   Final    Normal Performed at [facility], [address] [place], [place] [address]    Culture MULTIPLE SPECIES PRESENT, SUGGEST RECOLLECTION (A)  Final   Report Status 09/13/2021 FINAL  Final  Blood Culture ID Panel (Reflexed)     Status: Abnormal   Collection Time: 09/12/21 11:19 AM  Result Value Ref Range Status   Enterococcus faecalis NOT DETECTED NOT DETECTED Final   Enterococcus Faecium NOT DETECTED NOT  DETECTED Final   Listeria monocytogenes NOT DETECTED NOT DETECTED Final   Staphylococcus species NOT DETECTED NOT DETECTED Final   Staphylococcus aureus (BCID) NOT DETECTED NOT DETECTED Final   Staphylococcus epidermidis NOT DETECTED NOT DETECTED Final   Staphylococcus lugdunensis NOT DETECTED NOT DETECTED Final   Streptococcus species NOT DETECTED NOT DETECTED Final   Streptococcus agalactiae NOT DETECTED NOT DETECTED Final   Streptococcus pneumoniae NOT DETECTED NOT DETECTED Final   Streptococcus pyogenes NOT DETECTED NOT DETECTED Final   A.calcoaceticus-baumannii NOT DETECTED NOT DETECTED Final   Bacteroides fragilis NOT DETECTED NOT DETECTED Final   Enterobacterales DETECTED (A) NOT DETECTED Final    Comment: Enterobacterales represent a large order of gram negative bacteria, not a single organism. CRITICAL RESULT CALLED TO, READ BACK BY AND VERIFIED WITH: JASON ROBBINS AT 0500 09/13/21.PMF    Enterobacter cloacae complex NOT DETECTED NOT DETECTED Final   Escherichia coli DETECTED (A) NOT DETECTED Final    Comment: CRITICAL RESULT CALLED TO, READ BACK BY AND VERIFIED WITH: JASON ROBBINS AT 0500 09/13/21.PMF    Klebsiella aerogenes NOT DETECTED NOT DETECTED Final   Klebsiella oxytoca NOT DETECTED NOT DETECTED Final   Klebsiella pneumoniae NOT DETECTED NOT DETECTED Final   Proteus species NOT DETECTED NOT DETECTED Final   Salmonella species NOT DETECTED NOT DETECTED Final   Serratia marcescens NOT DETECTED NOT DETECTED Final   Haemophilus influenzae NOT DETECTED NOT DETECTED Final   Neisseria meningitidis NOT DETECTED NOT DETECTED Final   Pseudomonas aeruginosa NOT DETECTED NOT DETECTED Final   Stenotrophomonas maltophilia NOT DETECTED NOT DETECTED Final   Candida albicans NOT DETECTED NOT DETECTED Final   Candida auris NOT DETECTED NOT DETECTED Final   Candida glabrata NOT DETECTED NOT DETECTED Final   Candida krusei NOT DETECTED NOT DETECTED Final   Candida parapsilosis NOT  DETECTED NOT DETECTED Final   Candida tropicalis NOT DETECTED NOT DETECTED Final   Cryptococcus neoformans/gattii NOT DETECTED NOT DETECTED Final   CTX-M ESBL NOT DETECTED NOT DETECTED Final   Carbapenem resistance IMP NOT DETECTED NOT DETECTED Final   Carbapenem resistance KPC NOT DETECTED NOT DETECTED Final   Carbapenem resistance NDM NOT DETECTED NOT DETECTED Final   Carbapenem resist OXA 48 LIKE NOT DETECTED NOT DETECTED Final   Carbapenem resistance VIM NOT DETECTED NOT DETECTED Final    Comment: Performed at [facility], [address] [place], [place] [address]  Resp Panel by RT-PCR (Flu A&B, Covid) Nasopharyngeal Swab     Status: Abnormal   Collection Time: 09/17/21 11:10 AM   Specimen: Nasopharyngeal Swab; Nasopharyngeal(NP) swabs in vial transport  medium  Result Value Ref Range Status   SARS Coronavirus 2 by RT PCR POSITIVE (A) NEGATIVE Final    Comment: RESULT CALLED TO, READ BACK BY AND VERIFIED WITH: LINDSAY SHIFFLIT 09/17/21 1206 KLW (NOTE) SARS-CoV-2 target nucleic acids are DETECTED.  The SARS-CoV-2 RNA is generally detectable in upper respiratory specimens during the acute phase of infection. Positive results are indicative of the presence of the identified virus, but do not rule out bacterial infection or co-infection with other pathogens not detected by the test. Clinical correlation with patient history and other diagnostic information is necessary to determine patient infection status. The expected result is Negative.  Fact Sheet for Patients: EntrepreneurPulse.com.au  Fact Sheet for Healthcare Providers: IncredibleEmployment.be  This test is not yet approved or cleared by the [place] FDA and  has been authorized for detection and/or diagnosis of SARS-CoV-2 by FDA under an Emergency Use Authorization (EUA).  This EUA will remain in effect (meaning this test can b e used) for the duration of  the COVID-19  declaration under Section 564(b)(1) of the Act, 21 U.S.C. section 360bbb-3(b)(1), unless the authorization is terminated or revoked sooner.     Influenza A by PCR NEGATIVE NEGATIVE Final   Influenza B by PCR NEGATIVE NEGATIVE Final    Comment: (NOTE) The Xpert Xpress SARS-CoV-2/FLU/RSV plus assay is intended as an aid in the diagnosis of influenza from Nasopharyngeal swab specimens and should not be used as a sole basis for treatment. Nasal washings and aspirates are unacceptable for Xpert Xpress SARS-CoV-2/FLU/RSV testing.  Fact Sheet for Patients: EntrepreneurPulse.com.au  Fact Sheet for Healthcare Providers: IncredibleEmployment.be  This test is not yet approved or cleared by the [place] FDA and has been authorized for detection and/or diagnosis of SARS-CoV-2 by FDA under an Emergency Use Authorization (EUA). This EUA will remain in effect (meaning this test can be used) for the duration of the COVID-19 declaration under Section 564(b)(1) of the Act, 21 U.S.C. section 360bbb-3(b)(1), unless the authorization is terminated or revoked.  Performed at [facility], [address] [place], [place] [address]       Imaging Studies   DG Chest [place] 1 View  Result Date: 09/18/2021 CLINICAL DATA:  Coronavirus infection. EXAM: PORTABLE CHEST 1 VIEW COMPARISON:  09/11/2021 FINDINGS: Cardiomegaly as seen previously. Chronic aortic atherosclerotic calcification. Patchy density in both lower lobes, left more than right, consistent with atelectasis and or mild basilar pneumonia. No lobar consolidation or collapse. There could possibly be a small amount of pleural fluid on the left. IMPRESSION: Patchy density in the lung bases left more than right consistent with atelectasis or mild basilar pneumonia. Similar or slightly worsened compared to the study of 09/11/2021. Electronically Signed   By: Nelson Chimes M.D.   On: 09/18/2021 10:43      Medications   Scheduled Meds:  ALPRAZolam  1 mg Oral QHS   apixaban  2.5 mg Oral BID   Chlorhexidine Gluconate Cloth  6 each Topical Daily   feeding supplement  1 Container Oral TID BM   fluticasone  2 spray Each Nare BID   folic acid  6,045 mcg Oral Daily   gabapentin  300 mg Oral BID AC & HS   hydroxychloroquine  200 mg Oral BID   levofloxacin  750 mg Oral Daily   metoprolol succinate  12.5 mg Oral QHS   mirabegron ER  50 mg Oral Daily   pantoprazole  40 mg Oral Daily   polyethylene glycol  17 g  Oral QHS   pravastatin  20 mg Oral QHS   vitamin B-12  1,000 mcg Oral Daily   Continuous Infusions:  remdesivir 100 mg in NS 100 mL 100 mg (09/19/21 1000)       LOS: 8 days    Time spent: 30 minutes    Ezekiel Slocumb, DO [facility]  09/19/2021, 3:34 PM      If 7PM-7AM, please contact night-coverage. How to contact the [facility] Attending or Consulting provider [place] or covering provider during after hours [place], for this patient?    Check the care team in [facility] and look for a) attending/consulting [facility] provider listed and b) the [facility] team listed Log into [email or web address] and use [place]'s universal password to access. If you do not have the password, please contact the hospital operator. Locate the [facility] provider you are looking for under [facility] and page to a number that you can be directly reached. If you still have difficulty reaching the provider, please page the [facility] (Director on Call) for the Hospitalists listed on amion for assistance.

## 2021-09-20 ENCOUNTER — Ambulatory Visit: Payer: Medicare PPO

## 2021-09-20 ENCOUNTER — Telehealth: Payer: Self-pay | Admitting: Pulmonary Disease

## 2021-09-20 DIAGNOSIS — B9620 Unspecified Escherichia coli [E. coli] as the cause of diseases classified elsewhere: Secondary | ICD-10-CM | POA: Diagnosis not present

## 2021-09-20 DIAGNOSIS — R7881 Bacteremia: Secondary | ICD-10-CM | POA: Diagnosis not present

## 2021-09-20 LAB — COMPREHENSIVE METABOLIC PANEL
ALT: 20 U/L (ref 0–44)
AST: 29 U/L (ref 15–41)
Albumin: 2.3 g/dL — ABNORMAL LOW (ref 3.5–5.0)
Alkaline Phosphatase: 46 U/L (ref 38–126)
Anion gap: 13 (ref 5–15)
BUN: 13 mg/dL (ref 8–23)
CO2: 25 mmol/L (ref 22–32)
Calcium: 7.5 mg/dL — ABNORMAL LOW (ref 8.9–10.3)
Chloride: 99 mmol/L (ref 98–111)
Creatinine, Ser: 0.87 mg/dL (ref 0.61–1.24)
GFR, Estimated: >60 mL/min (ref >60)
Glucose, Bld: 90 mg/dL (ref 70–99)
Potassium: 3.7 mmol/L (ref 3.5–5.1)
Sodium: 137 mmol/L (ref 135–145)
Total Bilirubin: 0.7 mg/dL (ref 0.3–1.2)
Total Protein: 5.2 g/dL — ABNORMAL LOW (ref 6.5–8.1)

## 2021-09-20 LAB — CBC
HCT: 25.5 % — ABNORMAL LOW (ref 39.0–52.0)
Hemoglobin: 8.6 g/dL — ABNORMAL LOW (ref 13.0–17.0)
MCH: 34.4 pg — ABNORMAL HIGH (ref 26.0–34.0)
MCHC: 33.7 g/dL (ref 30.0–36.0)
MCV: 102 fL — ABNORMAL HIGH (ref 80.0–100.0)
Platelets: 170 10*3/uL (ref 150–400)
RBC: 2.5 MIL/uL — ABNORMAL LOW (ref 4.22–5.81)
RDW: 14.6 % (ref 11.5–15.5)
WBC: 4.8 10*3/uL (ref 4.0–10.5)
nRBC: 0 % (ref 0.0–0.2)

## 2021-09-20 LAB — D-DIMER, QUANTITATIVE: D-Dimer, Quant: 0.97 ug/mL-FEU — ABNORMAL HIGH (ref 0.00–0.50)

## 2021-09-20 LAB — C-REACTIVE PROTEIN: CRP: 5.9 mg/dL — ABNORMAL HIGH (ref <1.0)

## 2021-09-20 MED ORDER — TRAZODONE HCL 100 MG PO TABS
100.0000 mg | ORAL_TABLET | Freq: Every day | ORAL | Status: DC
  Administered 2021-09-20 – 2021-09-27 (×8): 100 mg via ORAL
  Filled 2021-09-20 (×9): qty 1

## 2021-09-20 MED ORDER — ALPRAZOLAM 0.5 MG PO TABS
0.5000 mg | ORAL_TABLET | Freq: Every day | ORAL | Status: DC
  Administered 2021-09-20 – 2021-09-27 (×8): 0.5 mg via ORAL
  Filled 2021-09-20 (×8): qty 1

## 2021-09-20 NOTE — Telephone Encounter (Signed)
Noted  

## 2021-09-20 NOTE — Telephone Encounter (Signed)
The patient daughter called (DPR) wanted to let you know that her dad is still in the hospital with Covid. She reports that he is not active symptoms and the doctor is hopeful for recovery.   She reports that the provider started him on Redismivir at this time for 3 days to help prevent symptoms. The daughter reports that he is going to Physical therapy after 10 days and she was told after Nov.1, 22 that he could to to rehab to learn to walk again. This is only temporary.    This is an Pharmacist, hospital for Dr. Halford Chessman. Nothing further needed.

## 2021-09-20 NOTE — Progress Notes (Signed)
Physical Therapy Treatment Patient Details Name: [name] MRN: [id number] DOB: 08-24-1934 Today's Date: 09/20/2021   History of Present Illness [name]  is a 87 y.o. male with a known history of chronic diastolic congestive heart failure, indwelling Foley catheter, rheumatoid arthritis, chronic hyponatremia, chronic kidney disease. He has been exhausted lately and having trouble sleeping, and has been weak and struggling to get up and down. In the ER he was hypotensive with a blood pressure of 94/37 on 1 occasion.  He was dehydrated with a BUN of 36 and a creatinine up to 2.28.    PT Comments    Pt seen for PT tx with pt received on bedpan in bed. PT provides max assist for rolling L<>R with PT assisting pt with placing hand on bed rail then pt able to somewhat participate. Pt did not have BM. Pt requires total assist for supine>sit but can sit EOB with close supervision. Pt attempts sit>stand x 2, only successful x 1 attempt. Pt requires MAX assist & cuing for anterior weight shifting with pt unable to achieve full upright standing posture as he maintains BLE hip & knee flexion & posterior pelvis. Pt incontinent of BM smear & PT provides total assist for peri hygiene. Pt is able to assist with scooting to [facility] with bed in significant trendelenburg position & PT blocking BLE & providing max cuing. Continue to recommend pt d/c to STR from acute setting.     Recommendations for follow up therapy are one component of a multi-disciplinary discharge planning process, led by the attending physician.  Recommendations may be updated based on patient status, additional functional criteria and insurance authorization.  Follow Up Recommendations  Skilled nursing-short term rehab (<3 hours/day)     Assistance Recommended at Discharge Frequent or constant Supervision/Assistance  Equipment Recommendations   (TBD in next venue)    Recommendations for Other Services       Precautions / Restrictions  Precautions Precautions: Fall Restrictions Weight Bearing Restrictions: No     Mobility  Bed Mobility Overal bed mobility: Needs Assistance Bed Mobility: Rolling Rolling: Max assist (assistance to reach over to grab bed rail then pt able to slightly assist)   Supine to sit: Total assist Sit to supine: Max assist   General bed mobility comments: assistance to move BLE to EOB & upright trunk    Transfers Overall transfer level: Needs assistance Equipment used: Rolling walker (2 wheels) Transfers: Sit to/from Stand Sit to Stand: Max assist;Total assist           General transfer comment: Pt elects to hold to RW with BUE vs pushing to stand. Requires PT blocking knees as able, max cuing & assistance for anterior weight shifting. Pt unable to achieve full upright standing posture as he maintains BLE hip & knee flexion and unable to shift pelvis anteriorly.    Ambulation/Gait                 Stairs             Wheelchair Mobility    Modified Rankin (Stroke Patients Only)       Balance Overall balance assessment: Needs assistance Sitting-balance support: Feet supported;Bilateral upper extremity supported Sitting balance-[name]: Fair Sitting balance - Comments: supervision static sitting EOB   Standing balance support: Bilateral upper extremity supported;During functional activity Standing balance-[name]: Zero Standing balance comment: BUE support on RW & max assist 2/2 posterior lean  Cognition Arousal/Alertness: Awake/alert Behavior During Therapy: Flat affect Overall Cognitive Status: Within Functional Limits for tasks assessed                                 General Comments: Tangential conversation. Requires increased processing time        Exercises General Exercises - Upper Extremity Chair Push Up: Strengthening;10 reps;Seated General Exercises - Lower Extremity Ankle  Circles/Pumps: AROM;Both;10 reps;Seated Long Arc Quad: AROM;Both;10 reps;Seated Hip Flexion/Marching: AROM;Both;10 reps;Seated    General Comments General comments (skin integrity, edema, etc.): Pt c/o dizziness after transfer to recliner. Legs elevated, vitals obtained (BP 118/52, HR 85, SpO2 96%), and RN informed. Pt agreeable to staying in recliner and was left with all needs within reach      Pertinent Vitals/Pain Pain Assessment: No/denies pain Faces Pain Scale: Hurts a little bit Pain Location: BLE when PT touches feet Pain Descriptors / Indicators: Grimacing;Discomfort Pain Intervention(s): Limited activity within patient's tolerance;Repositioned    Home Living                          Prior Function            PT Goals (current goals can now be found in the care plan section) Acute Rehab PT Goals Patient Stated Goal: to return home with family PT Goal Formulation: With patient Time For Goal Achievement: 09/26/21 Potential to Achieve Goals: Fair Progress towards PT goals: Progressing toward goals    Frequency    Min 2X/week      PT Plan Current plan remains appropriate    Co-evaluation              AM-PAC PT "6 Clicks" Mobility   Outcome Measure  Help needed turning from your back to your side while in a flat bed without using bedrails?: Total Help needed moving from lying on your back to sitting on the side of a flat bed without using bedrails?: Total Help needed moving to and from a bed to a chair (including a wheelchair)?: Total Help needed standing up from a chair using your arms (e.g., wheelchair or bedside chair)?: Total Help needed to walk in hospital room?: Total Help needed climbing 3-5 steps with a railing? : Total 6 Click Score: 6    End of Session Equipment Utilized During Treatment: Gait belt Activity Tolerance: Patient tolerated treatment well;Patient limited by fatigue Patient left: in bed;with call bell/phone within  reach;with bed alarm set Nurse Communication: Mobility status PT Visit Diagnosis: Unsteadiness on feet (R26.81);Other abnormalities of gait and mobility (R26.89);Muscle weakness (generalized) (M62.81)     Time: 1435-1500 PT Time Calculation (min) (ACUTE ONLY): 25 min  Charges:  $Therapeutic Activity: 23-37 mins                     Lavone Nian, PT, DPT 09/20/21, 3:16 PM    Waunita Schooner 09/20/2021, 3:14 PM

## 2021-09-20 NOTE — Progress Notes (Signed)
Occupational Therapy Treatment Patient Details Name: JEROME VIGLIONE MRN: [id number] DOB: Oct 05, 1934 Today's Date: 09/20/2021   History of present illness Jammal Sarr  is a 87 y.o. male with a known history of chronic diastolic congestive heart failure, indwelling Foley catheter, rheumatoid arthritis, chronic hyponatremia, chronic kidney disease. He has been exhausted lately and having trouble sleeping, and has been weak and struggling to get up and down. In the ER he was hypotensive with a blood pressure of 94/37 on 1 occasion.  He was dehydrated with a BUN of 36 and a creatinine up to 2.28.   OT comments  Pt seen for OT treatment on this date. Upon arrival to room, pt seated upright in bed, motivated to participate in OT tx. Pt continues to present with decreased safety awareness, decreased strength, and decreased activity tolerance, however is making good progress towards goals and was able to perform stand pivot transfer bed>recliner with MOD A+2. Once seated, pt requires MAX A for seated LB dressing and SET-UP assist for seated grooming tasks. Pt provided with HEP, with pt verbalizing and demonstrating good understanding. At end of session, pt left seated in recliner with all needs within reach. Pt continues to benefit from skilled OT services to maximize return to PLOF and minimize risk of future falls, injury, caregiver burden, and readmission. Will continue to follow POC. Discharge recommendation remains appropriate.     Recommendations for follow up therapy are one component of a multi-disciplinary discharge planning process, led by the attending physician.  Recommendations may be updated based on patient status, additional functional criteria and insurance authorization.    Follow Up Recommendations  Skilled nursing-short term rehab (<3 hours/day)    Assistance Recommended at Discharge Frequent or constant Supervision/Assistance  Equipment Recommendations  Other (comment) (defer to next venue  of care)       Precautions / Restrictions Precautions Precautions: Fall Restrictions Weight Bearing Restrictions: No       Mobility Bed Mobility Overal bed mobility: Needs Assistance Bed Mobility: Supine to Sit;Sit to Supine     Supine to sit: Total assist          Transfers Overall transfer level: Needs assistance   Transfers: Sit to/from Stand Sit to Stand: Max assist;From elevated surface           General transfer comment: x2 bouts. Pt demonstrates poor awareness as requires MAX verbal cues for safe hand placement with RW use     Balance Overall balance assessment: Needs assistance Sitting-balance support: Feet supported;Bilateral upper extremity supported Sitting balance-[name]: Fair Sitting balance - Comments: supervision for seated ADLs at EOB   Standing balance support: Bilateral upper extremity supported;During functional activity Standing balance-[name]: Poor Standing balance comment: Pt requires MOD A+2 to perform stand pivot transfer. Pt with some L knee buckling during transfer                           ADL either performed or assessed with clinical judgement   ADL Overall ADL's : Needs assistance/impaired     Grooming: Wash/dry face;Supervision/safety;Set up;Sitting               Lower Body Dressing: Maximal assistance;Sitting/lateral leans Lower Body Dressing Details (indicate cue type and reason): to don socks             Functional mobility during ADLs: Moderate assistance;+2 for physical assistance;Rolling walker (2 wheels)        Cognition Arousal/Alertness: Awake/alert Behavior  During Therapy: Flat affect Overall Cognitive Status: Within Functional Limits for tasks assessed                                 General Comments: Tangential conversation. Requires increased processing time          Exercises General Exercises - Upper Extremity Chair Push Up: Strengthening;10  reps;Seated General Exercises - Lower Extremity Ankle Circles/Pumps: AROM;Both;10 reps;Seated Long Arc Quad: AROM;Both;10 reps;Seated Hip Flexion/Marching: AROM;Both;10 reps;Seated   Shoulder Instructions       General Comments Pt c/o dizziness after transfer to recliner. Legs elevated, vitals obtained (BP 118/52, HR 85, SpO2 96%), and RN informed. Pt agreeable to staying in recliner and was left with all needs within reach    Pertinent Vitals/ Pain       Pain Assessment: Faces Faces Pain Scale: Hurts a little bit Pain Location: BLE when PT touches feet Pain Descriptors / Indicators: Grimacing;Discomfort Pain Intervention(s): Limited activity within patient's tolerance;Repositioned   Frequency  Min 1X/week        Progress Toward Goals  OT Goals(current goals can now be found in the care plan section)  Progress towards OT goals: Progressing toward goals  Acute Rehab OT Goals OT Goal Formulation: With patient Time For Goal Achievement: 09/26/21 Potential to Achieve Goals: [place] Discharge plan remains appropriate;Frequency remains appropriate       AM-PAC OT "6 Clicks" Daily Activity     Outcome Measure   Help from another person eating meals?: None Help from another person taking care of personal grooming?: A Lot Help from another person toileting, which includes using toliet, bedpan, or urinal?: A Lot Help from another person bathing (including washing, rinsing, drying)?: A Lot Help from another person to put on and taking off regular upper body clothing?: A Lot Help from another person to put on and taking off regular lower body clothing?: A Lot 6 Click Score: 14    End of Session Equipment Utilized During Treatment: Gait belt;Rolling walker (2 wheels)  OT Visit Diagnosis: Muscle weakness (generalized) (M62.81);Unsteadiness on feet (R26.81)   Activity Tolerance Patient tolerated treatment well   Patient Left in chair;with call bell/phone within reach;with  chair alarm set   Nurse Communication Mobility status        Time: 1324-4010 OT Time Calculation (min): 47 min  Charges: OT General Charges $OT Visit: 1 Visit OT Treatments $Self Care/Home Management : 8-22 mins $Therapeutic Activity: 23-37 mins  Fredirick Maudlin, OTR/L [place]

## 2021-09-20 NOTE — Progress Notes (Signed)
PROGRESS NOTE    [name]   ZOX:[id number]  DOB: November 30, 1933  PCP: Viviana Simpler I, MD    DOA: 09/11/2021 LOS: 9    Brief Narrative / Hospital Course to Date:   Per Dr. Alfredia Ferguson: "The patient is an 87 year old Caucasian male with a past medical history significant for 2 right renal nephrectomy given renal mass, chronic hyponatremia, chronic indwelling Foley catheter, chronic diastolic CHF, essential hypertension, persistent atrial fibrillation as well as other comorbidities who presented with hypotension initially.  Initial urinalysis showed blood but nitrates and leuk esterase was negative.  Initially antibiotics were held off but he spiked a temperature of 103 and blood cultures were drawn and E. coli was shown growing out of 1 out of 4 bottles.  His Foley catheter was changed out by urology and he is placed on IV ceftriaxone.  Nephrology been following given his AKI on admission and his IV fluids have now been stopped.  The E. coli bacteremia sensitivities came back and he is sensitive to cefazolin, Zosyn, imipenem, ciprofloxacin, and ceftriaxone which we will continue while hospitalized and change to po Levofloxacin at D/C.  We will obtain PT OT for further evaluate and treat and they are recommending SNF. His Renal Fxn is improved and he was stable to D/C to SNF however prior to D/C to SNF he tested Positive for COVID 19. His Inflammatory markers are elevated but He is asymptomatic and will need Isolation for 10 Days and can go to SNF on Sep 28, 2021. "  Assessment & Plan   Principal Problem:   E coli bacteremia Active Problems:   GAD (generalized anxiety disorder)   Coronary atherosclerosis of native coronary artery   IBS   Neurogenic bladder   Hyperlipidemia   Chronic constipation   Rheumatoid arthritis involving multiple sites with positive rheumatoid factor (HCC)   Chronic pain syndrome   DDD (degenerative disc disease), lumbosacral   COVID-19 virus infection   Fever   AF  (paroxysmal atrial fibrillation) (HCC)   Pancytopenia (HCC)   Acute kidney injury superimposed on CKD (HCC)   E. coli bacteremia in the setting of UTI and Indwelling Foley  -Had a episode of hypotension when he came in and then spiked a temperature of 103 on 09/13/2019 -And a temperature of 12:101 the night before.  He is continuing on IV ceftriaxone and sensitivities showing that he is sensitive to cefazolin, Zosyn, imipenem, ciprofloxacin and ceftriaxone. -Foley catheter was changed -Urine culture showed multiple organisms -Continue empiric antibiotics with IV Ceftriaxone and monitor clinical response to intervention; Change to po Levofloxacin at D/C for 3 days with end date on 09/20/21 -PT OT evaluated and recommending SNF and awaiting placement and Insurance Authorization   COVID-19 + -Incidental Finding as he was swabbed for SNF and tested positive -Asymptomatic -Checked Inflammatory Markers and Trend -Airborne and Contact Precautions -Will add Anti-tussives with Tussionex and Robitussin DM -Will add Combivent 1 puff IH q6hprn -Prone if Able -Vitamin C and Zinc  -Will give 3 Days of Remdesivir given No Symptoms; Hold off on Steroids currently -Checked CXR and showed "Patchy density in the lung bases left more than right consistent with atelectasis or mild basilar  pneumonia. Similar or slightly worsened compared to the study of 09/11/2021." -He is going to be placed in Isolation now for 10 Days  -Not Hypoxic and has no cough or Respiratory Sx   AKI on CKD stage IIIa Hx of Right Renal Nephrectomy  Chronic Indwelling Foley  -Improving  creatinine 2.2 >>1.19>>1.05>>0.89>>0.81 -Baseline creatinine is around 1.1-1.3 and in the setting of his right renal nephrectomy -Avoid further nephrotoxic medications, contrast dyes, hypotension, renally dose medications -Repeat CMP in the a.m.  -Nephrology consulted, recommended no further IV fluids -Follow up with Nephrology within 1 week     Hyponatremia - resolved. Mild. Sodium is now 134 -> 138 x2 -Continue to monitor and trend and repeat CMP within 1 week    Hypokalemia - resolved with replacement -Continue monitor K & Mg, replete as necessary -Repeat CMP within 1 week    Macrocytic anemia/anemia of chronic disease -Patient's Hbg/Hct was 11.1/33.3, likely hemoconcentrated on admission --H/H trended down to 7.9/22.6 on 10/19, improving since --Hbg 9.0 >>8.6  -Continue to monitor for signs and symptoms of bleeding; currently no overt bleeding noted -Check anemia panel in the outpatient setting  -Repeat CBC in a.m.   Thrombocytopenia - likely due to infection. Resolved. -Platelet count 82 >> trended down to 52 and improving and normal. -Monitor CBC   Paroxysmal atrial fibrillation -On low-dose Eliquis at 2.5 mg p.o. BID -Restarted on his Toprol succinate 12.5 g p.o. daily. -Follow up with Cardiology as an outpatient    Rheumatoid arthritis - Continue Plaquenil 200 mg po BID   Hyperlipidemia - Continue Pravastatin 20 mg p.o. nightly   Neuropathy - dose adjusted Gabapentin 300 mg p.o. twice daily   Pancytopenia -  -Pathology is reviewed and showed giant platelets and no schistocytes -Continue monitor CBCs  -Leukopenia has now resolved -Repeat CBC within 1 week   Chronic Diastolic CHF - appears compensated and euvolemic. -Echo in June 2021: EF of 60 to 65% -Nephrology recommending continue to hold diuretics at this time given his AKI in the setting of his overdiuresis with poor appetite -Monitor volume status: I/O's & daily weights Net IO Since Admission: -3,889.07 mL [09/20/21 1646] -Follow up with Cardiology as an outpatient and resume Diuretics in the outpatient setting or if edema develops   Asthma - takes Xolair every 14 days, next due Monday 10/24  --Follow up with pulmonology to reschedule, given infections  Insomnia - pt reported Trazodone 50 mg at home was not very effective. -- Resume trazodone at  100 mg at bedtime --Continue bedtime alprazolam  Generalized anxiety -continue home alprazolam. Outpatient follow-up.  Consider SSRI or BuSpar.   Obesity: Body mass index is 30.93 kg/m.  Complicates overall care and prognosis.  Recommend lifestyle modifications including physical activity and diet for weight loss and overall long-term health.   DVT prophylaxis: Place TED hose Start: 09/12/21 0907 apixaban (ELIQUIS) tablet 2.5 mg Start: 09/11/21 2200 apixaban (ELIQUIS) tablet 2.5 mg   Diet:  Diet Orders (From admission, onward)     Start     Ordered   09/17/21 0000  Diet - low sodium heart healthy        09/17/21 1158   09/11/21 1755  Diet heart healthy/carb modified Room service appropriate? Yes; Fluid consistency: Thin  Diet effective now       Question Answer Comment  Diet-HS Snack? Nothing   Room service appropriate? Yes   Fluid consistency: Thin      09/11/21 1755              Code Status: Full Code   Subjective 09/20/21    Pt up in recliner finishing lunch when seen today.  Daughter at bedside.  Patient reports overall feeling well.  Reports very poor sleep and has been recently started on trazodone at home, request this  to be ordered.  No other acute complaints.   Disposition Plan & Communication   Status is: Inpatient  Remains inpatient appropriate because: Covid+ requiring isolation prior to SNF placement, now delayed to Nov 1.   Family Communication: Daughter [name] at bedside on rounds today 10/24   Consults, Procedures, Significant Events   Consultants:  Nephrology  Procedures:  None  Antimicrobials:  Anti-infectives (From admission, onward)    Start     Dose/Rate Route Frequency Ordered Stop   09/19/21 1000  remdesivir 200 mg in sodium chloride 0.9% 250 mL IVPB  Status:  Discontinued       See Hyperspace for full Linked Orders Report.   100 mg 540 mL/hr over 30 Minutes Intravenous Daily 09/18/21 1202 09/18/21 1224   09/19/21 1000   remdesivir 100 mg in sodium chloride 0.9 % 100 mL IVPB       See Hyperspace for full Linked Orders Report.   100 mg 200 mL/hr over 30 Minutes Intravenous Daily 09/18/21 1224 09/20/21 1039   09/18/21 1315  remdesivir 200 mg in sodium chloride 0.9% 250 mL IVPB       See Hyperspace for full Linked Orders Report.   200 mg 580 mL/hr over 30 Minutes Intravenous Once 09/18/21 1224 09/18/21 1541   09/18/21 1300  remdesivir 200 mg in sodium chloride 0.9% 250 mL IVPB  Status:  Discontinued       See Hyperspace for full Linked Orders Report.   200 mg 580 mL/hr over 30 Minutes Intravenous Once 09/18/21 1202 09/18/21 1224   09/18/21 1000  levofloxacin (LEVAQUIN) tablet 750 mg        750 mg Oral Daily 09/17/21 1041 09/20/21 1003   09/18/21 0000  levofloxacin (LEVAQUIN) 750 MG tablet        750 mg Oral Daily 09/17/21 1158 09/21/21 2359   09/13/21 1000  cefTRIAXone (ROCEPHIN) 2 g in sodium chloride 0.9 % 100 mL IVPB  Status:  Discontinued        2 g 200 mL/hr over 30 Minutes Intravenous Every 24 hours 09/13/21 0624 09/17/21 1041   09/12/21 1000  piperacillin-tazobactam (ZOSYN) IVPB 3.375 g  Status:  Discontinued        3.375 g 12.5 mL/hr over 240 Minutes Intravenous Every 8 hours 09/12/21 0858 09/13/21 0622   09/11/21 2200  hydroxychloroquine (PLAQUENIL) tablet 200 mg        200 mg Oral 2 times daily 09/11/21 1758           Micro    Objective   Vitals:   09/20/21 0324 09/20/21 1124 09/20/21 1308 09/20/21 1543  BP: (!) 159/50 (!) 118/52 (!) 138/52 129/73  Pulse: 90 82 74 93  Resp: 18 18 18 16   Temp: 98.8 F (37.1 C) 98.7 F (37.1 C) 97.8 F (36.6 C) (!) 97.5 F (36.4 C)  TempSrc: Oral Oral  Oral  SpO2: 96% 96% 93% 96%  Weight:        Intake/Output Summary (Last 24 hours) at 09/20/2021 1646 Last data filed at 09/20/2021 0329 Gross per 24 hour  Intake --  Output 600 ml  Net -600 ml   Filed Weights   09/11/21 1219  Weight: 95 kg    Physical Exam:  General exam: Sitting in  recliner, awake, alert, no acute distress Respiratory system: Lungs clear bilaterally, no wheezes or rhonchi heard, normal respiratory effort, on room air. Cardiovascular system: normal S1/S2, RRR, no lower extremity edema.   Gastrointestinal system: Abdomen soft nontender Central nervous  system: A&O x4. no gross focal neurologic deficits, normal speech Genitourinary: indwelling Foley in place Psychiatry: normal mood, congruent affect, judgement and insight appear normal  Labs   Data Reviewed: I have personally reviewed following labs and imaging studies  CBC: Recent Labs  Lab 09/15/21 0913 09/16/21 0922 09/17/21 0902 09/18/21 1011 09/20/21 0431  WBC 4.0 3.7* 4.6 5.1 4.8  NEUTROABS 2.8 2.2 2.8 3.1  --   HGB 7.9* 8.1* 9.2* 9.0* 8.6*  HCT 22.6* 22.8* 26.0* 25.5* 25.5*  MCV 100.0 99.1 100.4* 101.2* 102.0*  PLT 58* 73* 110* 162 825   Basic Metabolic Panel: Recent Labs  Lab 09/15/21 0845 09/15/21 0913 09/16/21 0922 09/17/21 0902 09/18/21 1011 09/20/21 0431  NA 134*  --  134* 138 138 137  K 3.3*  --  3.2* 3.8 3.8 3.7  CL 99  --  99 103 104 99  CO2 25  --  26 25 27 25   GLUCOSE 104*  --  100* 111* 111* 90  BUN 29*  --  21 17 15 13   CREATININE 1.19  --  1.05 0.89 0.81 0.87  CALCIUM 7.8*  --  7.9* 8.0* 7.8* 7.5*  MG  --  2.0 2.1 1.9 1.9  --   PHOS 2.6  --  2.6 2.6 3.4  --    GFR: Estimated Creatinine Clearance: 70.6 mL/min (by C-G formula based on SCr of 0.87 mg/dL). Liver Function Tests: Recent Labs  Lab 09/15/21 0913 09/16/21 0922 09/17/21 0902 09/18/21 1011 09/20/21 0431  AST 26 26 27 22 29   ALT 20 23 23 19 20   ALKPHOS 38 40 43 43 46  BILITOT 0.5 0.5 0.6 0.6 0.7  PROT 4.8* 4.9* 5.3* 5.2* 5.2*  ALBUMIN 2.3* 2.4* 2.4* 2.3* 2.3*   No results for input(s): LIPASE, AMYLASE in the last 168 hours. No results for input(s): AMMONIA in the last 168 hours. Coagulation Profile: No results for input(s): INR, PROTIME in the last 168 hours. Cardiac Enzymes: No results  for input(s): CKTOTAL, CKMB, CKMBINDEX, TROPONINI in the last 168 hours. BNP (last 3 results) No results for input(s): PROBNP in the last 8760 hours. HbA1C: No results for input(s): HGBA1C in the last 72 hours. CBG: No results for input(s): GLUCAP in the last 168 hours. Lipid Profile: No results for input(s): CHOL, HDL, LDLCALC, TRIG, CHOLHDL, LDLDIRECT in the last 72 hours. Thyroid Function Tests: No results for input(s): TSH, T4TOTAL, FREET4, T3FREE, THYROIDAB in the last 72 hours. Anemia Panel: Recent Labs    09/18/21 1011  FERRITIN 129   Sepsis Labs: No results for input(s): PROCALCITON, LATICACIDVEN in the last 168 hours.  Recent Results (from the past 240 hour(s))  Resp Panel by RT-PCR (Flu A&B, Covid) Urine, Catheterized     Status: None   Collection Time: 09/11/21  3:41 PM   Specimen: Urine, Catheterized; Nasopharyngeal(NP) swabs in vial transport medium  Result Value Ref Range Status   SARS Coronavirus 2 by RT PCR NEGATIVE NEGATIVE Final    Comment: (NOTE) SARS-CoV-2 target nucleic acids are NOT DETECTED.  The SARS-CoV-2 RNA is generally detectable in upper respiratory specimens during the acute phase of infection. The lowest concentration of SARS-CoV-2 viral copies this assay can detect is 138 copies/mL. A negative result does not preclude SARS-Cov-2 infection and should not be used as the sole basis for treatment or other patient management decisions. A negative result may occur with  improper specimen collection/handling, submission of specimen other than nasopharyngeal swab, presence of viral mutation(s) within the  areas targeted by this assay, and inadequate number of viral copies(<138 copies/mL). A negative result must be combined with clinical observations, patient history, and epidemiological information. The expected result is Negative.  Fact Sheet for Patients:  EntrepreneurPulse.com.au  Fact Sheet for Healthcare Providers:   IncredibleEmployment.be  This test is no t yet approved or cleared by the [place] FDA and  has been authorized for detection and/or diagnosis of SARS-CoV-2 by FDA under an Emergency Use Authorization (EUA). This EUA will remain  in effect (meaning this test can be used) for the duration of the COVID-19 declaration under Section 564(b)(1) of the Act, 21 U.S.C.section 360bbb-3(b)(1), unless the authorization is terminated  or revoked sooner.       Influenza A by PCR NEGATIVE NEGATIVE Final   Influenza B by PCR NEGATIVE NEGATIVE Final    Comment: (NOTE) The Xpert Xpress SARS-CoV-2/FLU/RSV plus assay is intended as an aid in the diagnosis of influenza from Nasopharyngeal swab specimens and should not be used as a sole basis for treatment. Nasal washings and aspirates are unacceptable for Xpert Xpress SARS-CoV-2/FLU/RSV testing.  Fact Sheet for Patients: EntrepreneurPulse.com.au  Fact Sheet for Healthcare Providers: IncredibleEmployment.be  This test is not yet approved or cleared by the [place] FDA and has been authorized for detection and/or diagnosis of SARS-CoV-2 by FDA under an Emergency Use Authorization (EUA). This EUA will remain in effect (meaning this test can be used) for the duration of the COVID-19 declaration under Section 564(b)(1) of the Act, 21 U.S.C. section 360bbb-3(b)(1), unless the authorization is terminated or revoked.  Performed at [facility], [address] [place], [place] [address]   CULTURE, BLOOD (ROUTINE X 2) w Reflex to ID Panel     Status: Abnormal   Collection Time: 09/12/21 11:19 AM   Specimen: BLOOD  Result Value Ref Range Status   Specimen Description   Final    BLOOD LEFT HABD Performed at [facility], [address] [place], [place] [address]    Special Requests   Final    BOTTLES DRAWN AEROBIC AND ANAEROBIC Blood Culture adequate  volume Performed at [facility], [address] [place], [place] [address]    Culture  Setup Time   Final    GRAM NEGATIVE RODS ANAEROBIC BOTTLE ONLY CRITICAL RESULT CALLED TO, READ BACK BY AND VERIFIED WITH: JASON ROBBINS AT 0500 09/13/21.PMF Performed at [facility], [place] [address], [place], [place] [address]    Culture ESCHERICHIA COLI (A)  Final   Report Status 09/15/2021 FINAL  Final   Organism ID, Bacteria ESCHERICHIA COLI  Final      Susceptibility   Escherichia coli - MIC*    AMPICILLIN >=32 RESISTANT Resistant     CEFAZOLIN 8 SENSITIVE Sensitive     CEFEPIME <=0.12 SENSITIVE Sensitive     CEFTAZIDIME <=1 SENSITIVE Sensitive     CEFTRIAXONE <=0.25 SENSITIVE Sensitive     CIPROFLOXACIN <=0.25 SENSITIVE Sensitive     GENTAMICIN >=16 RESISTANT Resistant     IMIPENEM <=0.25 SENSITIVE Sensitive     TRIMETH/SULFA >=320 RESISTANT Resistant     AMPICILLIN/SULBACTAM >=32 RESISTANT Resistant     PIP/TAZO <=4 SENSITIVE Sensitive     * ESCHERICHIA COLI  CULTURE, BLOOD (ROUTINE X 2) w Reflex to ID Panel     Status: None   Collection Time: 09/12/21 11:19 AM   Specimen: BLOOD  Result Value Ref Range Status   Specimen Description BLOOD RIGHT HAND  Final   Special  Requests   Final    BOTTLES DRAWN AEROBIC AND ANAEROBIC Blood Culture adequate volume   Culture   Final    NO GROWTH 5 DAYS Performed at [facility], [address] [place], [place] [address]    Report Status 09/17/2021 FINAL  Final  Urine Culture     Status: Abnormal   Collection Time: 09/12/21 11:19 AM   Specimen: Urine, Random  Result Value Ref Range Status   Specimen Description   Final    URINE, RANDOM Performed at [facility], [address] [place], [place] [address]    Special Requests   Final    Normal Performed at [facility], [address] [place], [place] [address]    Culture MULTIPLE SPECIES PRESENT, SUGGEST RECOLLECTION (A)  Final   Report Status  09/13/2021 FINAL  Final  Blood Culture ID Panel (Reflexed)     Status: Abnormal   Collection Time: 09/12/21 11:19 AM  Result Value Ref Range Status   Enterococcus faecalis NOT DETECTED NOT DETECTED Final   Enterococcus Faecium NOT DETECTED NOT DETECTED Final   Listeria monocytogenes NOT DETECTED NOT DETECTED Final   Staphylococcus species NOT DETECTED NOT DETECTED Final   Staphylococcus aureus (BCID) NOT DETECTED NOT DETECTED Final   Staphylococcus epidermidis NOT DETECTED NOT DETECTED Final   Staphylococcus lugdunensis NOT DETECTED NOT DETECTED Final   Streptococcus species NOT DETECTED NOT DETECTED Final   Streptococcus agalactiae NOT DETECTED NOT DETECTED Final   Streptococcus pneumoniae NOT DETECTED NOT DETECTED Final   Streptococcus pyogenes NOT DETECTED NOT DETECTED Final   A.calcoaceticus-baumannii NOT DETECTED NOT DETECTED Final   Bacteroides fragilis NOT DETECTED NOT DETECTED Final   Enterobacterales DETECTED (A) NOT DETECTED Final    Comment: Enterobacterales represent a large order of gram negative bacteria, not a single organism. CRITICAL RESULT CALLED TO, READ BACK BY AND VERIFIED WITH: JASON ROBBINS AT 0500 09/13/21.PMF    Enterobacter cloacae complex NOT DETECTED NOT DETECTED Final   Escherichia coli DETECTED (A) NOT DETECTED Final    Comment: CRITICAL RESULT CALLED TO, READ BACK BY AND VERIFIED WITH: JASON ROBBINS AT 0500 09/13/21.PMF    Klebsiella aerogenes NOT DETECTED NOT DETECTED Final   Klebsiella oxytoca NOT DETECTED NOT DETECTED Final   Klebsiella pneumoniae NOT DETECTED NOT DETECTED Final   Proteus species NOT DETECTED NOT DETECTED Final   Salmonella species NOT DETECTED NOT DETECTED Final   Serratia marcescens NOT DETECTED NOT DETECTED Final   Haemophilus influenzae NOT DETECTED NOT DETECTED Final   Neisseria meningitidis NOT DETECTED NOT DETECTED Final   Pseudomonas aeruginosa NOT DETECTED NOT DETECTED Final   Stenotrophomonas maltophilia NOT DETECTED NOT  DETECTED Final   Candida albicans NOT DETECTED NOT DETECTED Final   Candida auris NOT DETECTED NOT DETECTED Final   Candida glabrata NOT DETECTED NOT DETECTED Final   Candida krusei NOT DETECTED NOT DETECTED Final   Candida parapsilosis NOT DETECTED NOT DETECTED Final   Candida tropicalis NOT DETECTED NOT DETECTED Final   Cryptococcus neoformans/gattii NOT DETECTED NOT DETECTED Final   CTX-M ESBL NOT DETECTED NOT DETECTED Final   Carbapenem resistance IMP NOT DETECTED NOT DETECTED Final   Carbapenem resistance KPC NOT DETECTED NOT DETECTED Final   Carbapenem resistance NDM NOT DETECTED NOT DETECTED Final   Carbapenem resist OXA 48 LIKE NOT DETECTED NOT DETECTED Final   Carbapenem resistance VIM NOT DETECTED NOT DETECTED Final    Comment: Performed at [facility], [address], [place], [place] [address]  Resp Panel by RT-PCR (Flu A&B, Covid) Nasopharyngeal Swab     Status: Abnormal   Collection Time: 09/17/21 11:10 AM   Specimen: Nasopharyngeal Swab; Nasopharyngeal(NP) swabs in vial transport medium  Result Value Ref Range Status   SARS Coronavirus 2 by RT PCR POSITIVE (A) NEGATIVE Final    Comment: RESULT CALLED TO, READ BACK BY AND VERIFIED WITH: LINDSAY SHIFFLIT 09/17/21 1206 KLW (NOTE) SARS-CoV-2 target nucleic acids are DETECTED.  The SARS-CoV-2 RNA is generally detectable in upper respiratory specimens during the acute phase of infection. Positive results are indicative of the presence of the identified virus, but do not rule out bacterial infection or co-infection with other pathogens not detected by the test. Clinical correlation with patient history and other diagnostic information is necessary to determine patient infection status. The expected result is Negative.  Fact Sheet for Patients: EntrepreneurPulse.com.au  Fact Sheet for Healthcare Providers: IncredibleEmployment.be  This test is not yet approved or cleared by  the [place] FDA and  has been authorized for detection and/or diagnosis of SARS-CoV-2 by FDA under an Emergency Use Authorization (EUA).  This EUA will remain in effect (meaning this test can b e used) for the duration of  the COVID-19 declaration under Section 564(b)(1) of the Act, 21 U.S.C. section 360bbb-3(b)(1), unless the authorization is terminated or revoked sooner.     Influenza A by PCR NEGATIVE NEGATIVE Final   Influenza B by PCR NEGATIVE NEGATIVE Final    Comment: (NOTE) The Xpert Xpress SARS-CoV-2/FLU/RSV plus assay is intended as an aid in the diagnosis of influenza from Nasopharyngeal swab specimens and should not be used as a sole basis for treatment. Nasal washings and aspirates are unacceptable for Xpert Xpress SARS-CoV-2/FLU/RSV testing.  Fact Sheet for Patients: EntrepreneurPulse.com.au  Fact Sheet for Healthcare Providers: IncredibleEmployment.be  This test is not yet approved or cleared by the [place] FDA and has been authorized for detection and/or diagnosis of SARS-CoV-2 by FDA under an Emergency Use Authorization (EUA). This EUA will remain in effect (meaning this test can be used) for the duration of the COVID-19 declaration under Section 564(b)(1) of the Act, 21 U.S.C. section 360bbb-3(b)(1), unless the authorization is terminated or revoked.  Performed at [facility], [address] [place], [place] [address]       Imaging Studies   No results found.   Medications   Scheduled Meds:  ALPRAZolam  0.5 mg Oral QHS   apixaban  2.5 mg Oral BID   Chlorhexidine Gluconate Cloth  6 each Topical Daily   feeding supplement  1 Container Oral TID BM   fluticasone  2 spray Each Nare BID   folic acid  0,630 mcg Oral Daily   gabapentin  300 mg Oral BID AC & HS   hydroxychloroquine  200 mg Oral BID   metoprolol succinate  12.5 mg Oral QHS   mirabegron ER  50 mg Oral Daily   pantoprazole  40 mg  Oral Daily   polyethylene glycol  17 g Oral QHS   pravastatin  20 mg Oral QHS   traZODone  100 mg Oral QHS   vitamin B-12  1,000 mcg Oral Daily   Continuous Infusions:       LOS: 9 days    Time spent: 25 minutes     Ezekiel Slocumb, DO [facility]  09/20/2021, 4:46 PM      If 7PM-7AM, please contact night-coverage. How to contact the [facility] Attending or Consulting provider [place] or  covering provider during after hours [place], for this patient?    Check the care team in [facility] and look for a) attending/consulting [facility] provider listed and b) the [facility] team listed Log into [email or web address] and use [place]'s universal password to access. If you do not have the password, please contact the hospital operator. Locate the [facility] provider you are looking for under [facility] and page to a number that you can be directly reached. If you still have difficulty reaching the provider, please page the [facility] (Director on Call) for the Hospitalists listed on amion for assistance.

## 2021-09-21 ENCOUNTER — Ambulatory Visit: Payer: Medicare PPO | Admitting: Physician Assistant

## 2021-09-21 DIAGNOSIS — B9620 Unspecified Escherichia coli [E. coli] as the cause of diseases classified elsewhere: Secondary | ICD-10-CM | POA: Diagnosis not present

## 2021-09-21 DIAGNOSIS — R7881 Bacteremia: Secondary | ICD-10-CM | POA: Diagnosis not present

## 2021-09-21 LAB — COMPREHENSIVE METABOLIC PANEL
ALT: 24 U/L (ref 0–44)
AST: 34 U/L (ref 15–41)
Albumin: 2.5 g/dL — ABNORMAL LOW (ref 3.5–5.0)
Alkaline Phosphatase: 44 U/L (ref 38–126)
Anion gap: 8 (ref 5–15)
BUN: 13 mg/dL (ref 8–23)
CO2: 26 mmol/L (ref 22–32)
Calcium: 8.2 mg/dL — ABNORMAL LOW (ref 8.9–10.3)
Chloride: 103 mmol/L (ref 98–111)
Creatinine, Ser: 0.97 mg/dL (ref 0.61–1.24)
GFR, Estimated: >60 mL/min (ref >60)
Glucose, Bld: 98 mg/dL (ref 70–99)
Potassium: 3.9 mmol/L (ref 3.5–5.1)
Sodium: 137 mmol/L (ref 135–145)
Total Bilirubin: 0.7 mg/dL (ref 0.3–1.2)
Total Protein: 5.3 g/dL — ABNORMAL LOW (ref 6.5–8.1)

## 2021-09-21 LAB — D-DIMER, QUANTITATIVE: D-Dimer, Quant: 1.04 ug/mL-FEU — ABNORMAL HIGH (ref 0.00–0.50)

## 2021-09-21 LAB — C-REACTIVE PROTEIN: CRP: 6.2 mg/dL — ABNORMAL HIGH (ref <1.0)

## 2021-09-21 LAB — GLUCOSE, CAPILLARY: Glucose-Capillary: 112 mg/dL — ABNORMAL HIGH (ref 70–99)

## 2021-09-21 MED ORDER — APIXABAN 5 MG PO TABS
5.0000 mg | ORAL_TABLET | Freq: Two times a day (BID) | ORAL | Status: DC
  Administered 2021-09-21 – 2021-09-28 (×15): 5 mg via ORAL
  Filled 2021-09-21 (×14): qty 1

## 2021-09-21 MED ORDER — FUROSEMIDE 40 MG PO TABS
40.0000 mg | ORAL_TABLET | Freq: Every day | ORAL | Status: DC
  Administered 2021-09-21 – 2021-09-28 (×8): 40 mg via ORAL
  Filled 2021-09-21 (×8): qty 1

## 2021-09-21 NOTE — Progress Notes (Signed)
PROGRESS NOTE    [name]   OAC:[id number]  DOB: 1934/06/17  PCP: Viviana Simpler I, MD    DOA: 09/11/2021 LOS: 46    Brief Narrative / Hospital Course to Date:   Per Dr. Alfredia Ferguson: "The patient is an 87 year old Caucasian male with a past medical history significant for 2 right renal nephrectomy given renal mass, chronic hyponatremia, chronic indwelling Foley catheter, chronic diastolic CHF, essential hypertension, persistent atrial fibrillation as well as other comorbidities who presented with hypotension initially.  Initial urinalysis showed blood but nitrates and leuk esterase was negative.  Initially antibiotics were held off but he spiked a temperature of 103 and blood cultures were drawn and E. coli was shown growing out of 1 out of 4 bottles.  His Foley catheter was changed out by urology and he is placed on IV ceftriaxone.  Nephrology been following given his AKI on admission and his IV fluids have now been stopped.  The E. coli bacteremia sensitivities came back and he is sensitive to cefazolin, Zosyn, imipenem, ciprofloxacin, and ceftriaxone which we will continue while hospitalized and change to po Levofloxacin at D/C.  We will obtain PT OT for further evaluate and treat and they are recommending SNF. His Renal Fxn is improved and he was stable to D/C to SNF however prior to D/C to SNF he tested Positive for COVID 19. His Inflammatory markers are elevated but He is asymptomatic and will need Isolation for 10 Days and can go to SNF on Sep 28, 2021. "  Assessment & Plan   Principal Problem:   E coli bacteremia Active Problems:   GAD (generalized anxiety disorder)   Coronary atherosclerosis of native coronary artery   IBS   Neurogenic bladder   Hyperlipidemia   Chronic constipation   Rheumatoid arthritis involving multiple sites with positive rheumatoid factor (HCC)   Chronic pain syndrome   DDD (degenerative disc disease), lumbosacral   COVID-19 virus infection   Fever   AF  (paroxysmal atrial fibrillation) (HCC)   Pancytopenia (HCC)   Acute kidney injury superimposed on CKD ([place])   Sepsis due to E. coli bacteremia in the setting of UTI and Indwelling Foley  -Sepsis as evidenced by episode of hypotension and fever of 103 F on 09/13/2019 -Foley catheter was changed -Urine culture showed multiple organisms -Completed course of Rocephin and now off antibiotics -Monitor clinically for signs or symptoms of infection   COVID-19 + - -Asymptomatic.  Incidental when screened for SNF and tested positive -Trend inflammatory markers -Airborne and Contact Precautions -Anti-tussives with Tussionex and Robitussin DM -Combivent 1 puff IH q6hprn -Vitamin C and Zinc  -Treating with 3 Days of Remdesivir given No Symptoms;  -Hold off steroids given no hypoxia -Checked CXR and showed "Patchy density in the lung bases left more than right consistent with atelectasis or mild basilar  pneumonia. Similar or slightly worsened compared to the study of 09/11/2021." -Not Hypoxic and has no cough or Respiratory Sx -Isolation now for 10 Days    AKI on CKD stage IIIa -AKI resolved Hx of Right Renal Nephrectomy  Chronic Indwelling Foley  -Improving creatinine 2.2 >>1.19>>1.05>>0.89>>0.81 -Baseline creatinine is around 1.1-1.3 and in the setting of his right renal nephrectomy -Monitor renal function -Nephrology consulted, recommended no further IV fluids -Follow up with Nephrology within 1 week    Hyponatremia - resolved.  -Continue to monitor and trend and repeat CMP within 1 week    Hypokalemia - resolved with replacement -Continue monitor K & Mg,  replete as necessary -Repeat CMP within 1 week    Macrocytic anemia/anemia of chronic disease -Patient's Hbg/Hct was 11.1/33.3, likely hemoconcentrated on admission --H/H trended down to 7.9/22.6 on 10/19, improving since --Hbg 9.0 >>8.6  -Continue to monitor for signs and symptoms of bleeding; currently no overt bleeding  noted -Check anemia panel in the outpatient setting  -Repeat CBC in a.m.   Thrombocytopenia - likely due to infection. Resolved. -Platelet count 82 >> trended down to 52 and improving and now normal. -Monitor CBC   Paroxysmal atrial fibrillation -rate controlled. -Continue Eliquis and metoprolol -Follow up with Cardiology as an outpatient    Rheumatoid arthritis - Continue Plaquenil 200 mg po BID   Hyperlipidemia - Continue Pravastatin 20 mg p.o. nightly   Neuropathy - dose adjusted Gabapentin 300 mg p.o. twice daily   Pancytopenia -  -Pathology is reviewed and showed giant platelets and no schistocytes -Continue monitor CBCs  -Leukopenia has now resolved -Repeat CBC within 1 week   Chronic Diastolic CHF -02/77: Developing lower extremity and pedal edema -Echo in June 2021: EF of 60 to 65% -Nephrology had recommended continue to hold diuretics at this time given his AKI in the setting of his overdiuresis with poor appetite  -Patient now eating and hydrating well and developing lower extremity and pedal edema -Resume home Lasix 40 mg daily -Monitor renal function and electrolytes closely -Monitor volume status: I/O's & daily weights Net IO Since Admission: -4,849.07 mL [09/21/21 1428] -Follow up with Cardiology as an outpatient and resume Diuretics in the outpatient setting or if edema develops   Asthma - takes Xolair every 14 days, next due Monday 10/24  --Follow up with pulmonology to reschedule, given infections  Insomnia - pt reported Trazodone 50 mg at home was not very effective. -- Trazodone at 100 mg at bedtime --Continue bedtime alprazolam  Generalized anxiety -continue home alprazolam. Outpatient follow-up.  Consider SSRI or BuSpar.   Obesity: Body mass index is 34.25 kg/m.  Complicates overall care and prognosis.  Recommend lifestyle modifications including physical activity and diet for weight loss and overall long-term health.   DVT prophylaxis: Place TED  hose Start: 09/12/21 0907 apixaban (ELIQUIS) tablet 5 mg   Diet:  Diet Orders (From admission, onward)     Start     Ordered   09/17/21 0000  Diet - low sodium heart healthy        09/17/21 1158   09/11/21 1755  Diet heart healthy/carb modified Room service appropriate? Yes; Fluid consistency: Thin  Diet effective now       Question Answer Comment  Diet-HS Snack? Nothing   Room service appropriate? Yes   Fluid consistency: Thin      09/11/21 1755              Code Status: Full Code   Subjective 09/21/21    Pt awake sitting up in bed when seen today.  He reports sleeping very well overnight and feels rested and better today.  This was after adding trazodone last night at bedtime.  He reports the right foot swelling chronically worse than left and requests TED hose to be placed.  Denies any other acute complaints.  Agrees with resuming his home furosemide to prevent worsening swelling which he states he is prone to.  No other acute complaints reported.   Disposition Plan & Communication   Status is: Inpatient  Remains inpatient appropriate because: Covid+ requiring isolation prior to SNF placement, now delayed to Nov 1.  Family Communication: Daughter [name] at bedside on rounds today 10/24   Consults, Procedures, Significant Events   Consultants:  Nephrology  Procedures:  None  Antimicrobials:  Anti-infectives (From admission, onward)    Start     Dose/Rate Route Frequency Ordered Stop   09/19/21 1000  remdesivir 200 mg in sodium chloride 0.9% 250 mL IVPB  Status:  Discontinued       See Hyperspace for full Linked Orders Report.   100 mg 540 mL/hr over 30 Minutes Intravenous Daily 09/18/21 1202 09/18/21 1224   09/19/21 1000  remdesivir 100 mg in sodium chloride 0.9 % 100 mL IVPB       See Hyperspace for full Linked Orders Report.   100 mg 200 mL/hr over 30 Minutes Intravenous Daily 09/18/21 1224 09/20/21 1039   09/18/21 1315  remdesivir 200 mg in sodium  chloride 0.9% 250 mL IVPB       See Hyperspace for full Linked Orders Report.   200 mg 580 mL/hr over 30 Minutes Intravenous Once 09/18/21 1224 09/18/21 1541   09/18/21 1300  remdesivir 200 mg in sodium chloride 0.9% 250 mL IVPB  Status:  Discontinued       See Hyperspace for full Linked Orders Report.   200 mg 580 mL/hr over 30 Minutes Intravenous Once 09/18/21 1202 09/18/21 1224   09/18/21 1000  levofloxacin (LEVAQUIN) tablet 750 mg        750 mg Oral Daily 09/17/21 1041 09/20/21 1003   09/18/21 0000  levofloxacin (LEVAQUIN) 750 MG tablet        750 mg Oral Daily 09/17/21 1158 09/21/21 2359   09/13/21 1000  cefTRIAXone (ROCEPHIN) 2 g in sodium chloride 0.9 % 100 mL IVPB  Status:  Discontinued        2 g 200 mL/hr over 30 Minutes Intravenous Every 24 hours 09/13/21 0624 09/17/21 1041   09/12/21 1000  piperacillin-tazobactam (ZOSYN) IVPB 3.375 g  Status:  Discontinued        3.375 g 12.5 mL/hr over 240 Minutes Intravenous Every 8 hours 09/12/21 0858 09/13/21 0622   09/11/21 2200  hydroxychloroquine (PLAQUENIL) tablet 200 mg        200 mg Oral 2 times daily 09/11/21 1758           Micro    Objective   Vitals:   09/20/21 2017 09/21/21 0329 09/21/21 0918 09/21/21 1229  BP: (!) 139/56 (!) 141/56 (!) 149/51   Pulse: 88 80 79   Resp: 20 20    Temp: 97.8 F (36.6 C) 98.5 F (36.9 C) 97.7 F (36.5 C)   TempSrc:  Axillary Oral   SpO2: 95% 92% 93%   Weight:    105.2 kg    Intake/Output Summary (Last 24 hours) at 09/21/2021 1428 Last data filed at 09/21/2021 0930 Gross per 24 hour  Intake 240 ml  Output 1200 ml  Net -960 ml   Filed Weights   09/11/21 1219 09/21/21 1229  Weight: 95 kg 105.2 kg    Physical Exam:  General exam: sitting up in bed, awake, alert, no acute distress Respiratory system: Lungs clear bilaterally, normal respiratory effort, on room air. Cardiovascular system: normal S1/S2, RRR, trace BLE edema with significant R pedal edema.   Central nervous  system: A&O x4. no gross focal neurologic deficits, normal speech Genitourinary: indwelling Foley in place, urine in bag light yellow  Psychiatry: normal mood, congruent affect, judgement and insight appear normal  Labs   Data Reviewed: I have personally reviewed  following labs and imaging studies  CBC: Recent Labs  Lab 09/15/21 0913 09/16/21 0922 09/17/21 0902 09/18/21 1011 09/20/21 0431  WBC 4.0 3.7* 4.6 5.1 4.8  NEUTROABS 2.8 2.2 2.8 3.1  --   HGB 7.9* 8.1* 9.2* 9.0* 8.6*  HCT 22.6* 22.8* 26.0* 25.5* 25.5*  MCV 100.0 99.1 100.4* 101.2* 102.0*  PLT 58* 73* 110* 162 637   Basic Metabolic Panel: Recent Labs  Lab 09/15/21 0845 09/15/21 0913 09/16/21 0922 09/17/21 0902 09/18/21 1011 09/20/21 0431 09/21/21 0609  NA 134*  --  134* 138 138 137 137  K 3.3*  --  3.2* 3.8 3.8 3.7 3.9  CL 99  --  99 103 104 99 103  CO2 25  --  26 25 27 25 26   GLUCOSE 104*  --  100* 111* 111* 90 98  BUN 29*  --  21 17 15 13 13   CREATININE 1.19  --  1.05 0.89 0.81 0.87 0.97  CALCIUM 7.8*  --  7.9* 8.0* 7.8* 7.5* 8.2*  MG  --  2.0 2.1 1.9 1.9  --   --   PHOS 2.6  --  2.6 2.6 3.4  --   --    GFR: Estimated Creatinine Clearance: 66.5 mL/min (by C-G formula based on SCr of 0.97 mg/dL). Liver Function Tests: Recent Labs  Lab 09/16/21 0922 09/17/21 0902 09/18/21 1011 09/20/21 0431 09/21/21 0609  AST 26 27 22 29  34  ALT 23 23 19 20 24   ALKPHOS 40 43 43 46 44  BILITOT 0.5 0.6 0.6 0.7 0.7  PROT 4.9* 5.3* 5.2* 5.2* 5.3*  ALBUMIN 2.4* 2.4* 2.3* 2.3* 2.5*   No results for input(s): LIPASE, AMYLASE in the last 168 hours. No results for input(s): AMMONIA in the last 168 hours. Coagulation Profile: No results for input(s): INR, PROTIME in the last 168 hours. Cardiac Enzymes: No results for input(s): CKTOTAL, CKMB, CKMBINDEX, TROPONINI in the last 168 hours. BNP (last 3 results) No results for input(s): PROBNP in the last 8760 hours. HbA1C: No results for input(s): HGBA1C in the last 72  hours. CBG: No results for input(s): GLUCAP in the last 168 hours. Lipid Profile: No results for input(s): CHOL, HDL, LDLCALC, TRIG, CHOLHDL, LDLDIRECT in the last 72 hours. Thyroid Function Tests: No results for input(s): TSH, T4TOTAL, FREET4, T3FREE, THYROIDAB in the last 72 hours. Anemia Panel: No results for input(s): VITAMINB12, FOLATE, FERRITIN, TIBC, IRON, RETICCTPCT in the last 72 hours.  Sepsis Labs: No results for input(s): PROCALCITON, LATICACIDVEN in the last 168 hours.  Recent Results (from the past 240 hour(s))  Resp Panel by RT-PCR (Flu A&B, Covid) Urine, Catheterized     Status: None   Collection Time: 09/11/21  3:41 PM   Specimen: Urine, Catheterized; Nasopharyngeal(NP) swabs in vial transport medium  Result Value Ref Range Status   SARS Coronavirus 2 by RT PCR NEGATIVE NEGATIVE Final    Comment: (NOTE) SARS-CoV-2 target nucleic acids are NOT DETECTED.  The SARS-CoV-2 RNA is generally detectable in upper respiratory specimens during the acute phase of infection. The lowest concentration of SARS-CoV-2 viral copies this assay can detect is 138 copies/mL. A negative result does not preclude SARS-Cov-2 infection and should not be used as the sole basis for treatment or other patient management decisions. A negative result may occur with  improper specimen collection/handling, submission of specimen other than nasopharyngeal swab, presence of viral mutation(s) within the areas targeted by this assay, and inadequate number of viral copies(<138 copies/mL). A negative result  must be combined with clinical observations, patient history, and epidemiological information. The expected result is Negative.  Fact Sheet for Patients:  EntrepreneurPulse.com.au  Fact Sheet for Healthcare Providers:  IncredibleEmployment.be  This test is no t yet approved or cleared by the [place] FDA and  has been authorized for detection and/or  diagnosis of SARS-CoV-2 by FDA under an Emergency Use Authorization (EUA). This EUA will remain  in effect (meaning this test can be used) for the duration of the COVID-19 declaration under Section 564(b)(1) of the Act, 21 U.S.C.section 360bbb-3(b)(1), unless the authorization is terminated  or revoked sooner.       Influenza A by PCR NEGATIVE NEGATIVE Final   Influenza B by PCR NEGATIVE NEGATIVE Final    Comment: (NOTE) The Xpert Xpress SARS-CoV-2/FLU/RSV plus assay is intended as an aid in the diagnosis of influenza from Nasopharyngeal swab specimens and should not be used as a sole basis for treatment. Nasal washings and aspirates are unacceptable for Xpert Xpress SARS-CoV-2/FLU/RSV testing.  Fact Sheet for Patients: EntrepreneurPulse.com.au  Fact Sheet for Healthcare Providers: IncredibleEmployment.be  This test is not yet approved or cleared by the [place] FDA and has been authorized for detection and/or diagnosis of SARS-CoV-2 by FDA under an Emergency Use Authorization (EUA). This EUA will remain in effect (meaning this test can be used) for the duration of the COVID-19 declaration under Section 564(b)(1) of the Act, 21 U.S.C. section 360bbb-3(b)(1), unless the authorization is terminated or revoked.  Performed at [facility], [address] [place], [place] [address]   CULTURE, BLOOD (ROUTINE X 2) w Reflex to ID Panel     Status: Abnormal   Collection Time: 09/12/21 11:19 AM   Specimen: BLOOD  Result Value Ref Range Status   Specimen Description   Final    BLOOD LEFT HABD Performed at [facility], [address], [place], [place] [address]    Special Requests   Final    BOTTLES DRAWN AEROBIC AND ANAEROBIC Blood Culture adequate volume Performed at [facility], [address], [place], [place] [address]    Culture  Setup Time   Final    GRAM NEGATIVE RODS ANAEROBIC BOTTLE ONLY CRITICAL  RESULT CALLED TO, READ BACK BY AND VERIFIED WITH: JASON ROBBINS AT 0500 09/13/21.PMF Performed at [facility], [place] [address] [place], [place] [address]    Culture ESCHERICHIA COLI (A)  Final   Report Status 09/15/2021 FINAL  Final   Organism ID, Bacteria ESCHERICHIA COLI  Final      Susceptibility   Escherichia coli - MIC*    AMPICILLIN >=32 RESISTANT Resistant     CEFAZOLIN 8 SENSITIVE Sensitive     CEFEPIME <=0.12 SENSITIVE Sensitive     CEFTAZIDIME <=1 SENSITIVE Sensitive     CEFTRIAXONE <=0.25 SENSITIVE Sensitive     CIPROFLOXACIN <=0.25 SENSITIVE Sensitive     GENTAMICIN >=16 RESISTANT Resistant     IMIPENEM <=0.25 SENSITIVE Sensitive     TRIMETH/SULFA >=320 RESISTANT Resistant     AMPICILLIN/SULBACTAM >=32 RESISTANT Resistant     PIP/TAZO <=4 SENSITIVE Sensitive     * ESCHERICHIA COLI  CULTURE, BLOOD (ROUTINE X 2) w Reflex to ID Panel     Status: None   Collection Time: 09/12/21 11:19 AM   Specimen: BLOOD  Result Value Ref Range Status   Specimen Description BLOOD RIGHT HAND  Final   Special Requests   Final    BOTTLES DRAWN AEROBIC AND ANAEROBIC Blood Culture adequate  volume   Culture   Final    NO GROWTH 5 DAYS Performed at [facility], [address] [place], [place] [address]    Report Status 09/17/2021 FINAL  Final  Urine Culture     Status: Abnormal   Collection Time: 09/12/21 11:19 AM   Specimen: Urine, Random  Result Value Ref Range Status   Specimen Description   Final    URINE, RANDOM Performed at [facility], [address] [place], [place] [address]    Special Requests   Final    Normal Performed at [facility], [address] [place], [place] [address]    Culture MULTIPLE SPECIES PRESENT, SUGGEST RECOLLECTION (A)  Final   Report Status 09/13/2021 FINAL  Final  Blood Culture ID Panel (Reflexed)     Status: Abnormal   Collection Time: 09/12/21 11:19 AM  Result Value Ref Range Status   Enterococcus faecalis  NOT DETECTED NOT DETECTED Final   Enterococcus Faecium NOT DETECTED NOT DETECTED Final   Listeria monocytogenes NOT DETECTED NOT DETECTED Final   Staphylococcus species NOT DETECTED NOT DETECTED Final   Staphylococcus aureus (BCID) NOT DETECTED NOT DETECTED Final   Staphylococcus epidermidis NOT DETECTED NOT DETECTED Final   Staphylococcus lugdunensis NOT DETECTED NOT DETECTED Final   Streptococcus species NOT DETECTED NOT DETECTED Final   Streptococcus agalactiae NOT DETECTED NOT DETECTED Final   Streptococcus pneumoniae NOT DETECTED NOT DETECTED Final   Streptococcus pyogenes NOT DETECTED NOT DETECTED Final   A.calcoaceticus-baumannii NOT DETECTED NOT DETECTED Final   Bacteroides fragilis NOT DETECTED NOT DETECTED Final   Enterobacterales DETECTED (A) NOT DETECTED Final    Comment: Enterobacterales represent a large order of gram negative bacteria, not a single organism. CRITICAL RESULT CALLED TO, READ BACK BY AND VERIFIED WITH: JASON ROBBINS AT 0500 09/13/21.PMF    Enterobacter cloacae complex NOT DETECTED NOT DETECTED Final   Escherichia coli DETECTED (A) NOT DETECTED Final    Comment: CRITICAL RESULT CALLED TO, READ BACK BY AND VERIFIED WITH: JASON ROBBINS AT 0500 09/13/21.PMF    Klebsiella aerogenes NOT DETECTED NOT DETECTED Final   Klebsiella oxytoca NOT DETECTED NOT DETECTED Final   Klebsiella pneumoniae NOT DETECTED NOT DETECTED Final   Proteus species NOT DETECTED NOT DETECTED Final   Salmonella species NOT DETECTED NOT DETECTED Final   Serratia marcescens NOT DETECTED NOT DETECTED Final   Haemophilus influenzae NOT DETECTED NOT DETECTED Final   Neisseria meningitidis NOT DETECTED NOT DETECTED Final   Pseudomonas aeruginosa NOT DETECTED NOT DETECTED Final   Stenotrophomonas maltophilia NOT DETECTED NOT DETECTED Final   Candida albicans NOT DETECTED NOT DETECTED Final   Candida auris NOT DETECTED NOT DETECTED Final   Candida glabrata NOT DETECTED NOT DETECTED Final    Candida krusei NOT DETECTED NOT DETECTED Final   Candida parapsilosis NOT DETECTED NOT DETECTED Final   Candida tropicalis NOT DETECTED NOT DETECTED Final   Cryptococcus neoformans/gattii NOT DETECTED NOT DETECTED Final   CTX-M ESBL NOT DETECTED NOT DETECTED Final   Carbapenem resistance IMP NOT DETECTED NOT DETECTED Final   Carbapenem resistance KPC NOT DETECTED NOT DETECTED Final   Carbapenem resistance NDM NOT DETECTED NOT DETECTED Final   Carbapenem resist OXA 48 LIKE NOT DETECTED NOT DETECTED Final   Carbapenem resistance VIM NOT DETECTED NOT DETECTED Final    Comment: Performed at [facility], [address] [place], [place] [address]  Resp Panel by RT-PCR (Flu A&B, Covid) Nasopharyngeal Swab     Status: Abnormal  Collection Time: 09/17/21 11:10 AM   Specimen: Nasopharyngeal Swab; Nasopharyngeal(NP) swabs in vial transport medium  Result Value Ref Range Status   SARS Coronavirus 2 by RT PCR POSITIVE (A) NEGATIVE Final    Comment: RESULT CALLED TO, READ BACK BY AND VERIFIED WITH: LINDSAY SHIFFLIT 09/17/21 1206 KLW (NOTE) SARS-CoV-2 target nucleic acids are DETECTED.  The SARS-CoV-2 RNA is generally detectable in upper respiratory specimens during the acute phase of infection. Positive results are indicative of the presence of the identified virus, but do not rule out bacterial infection or co-infection with other pathogens not detected by the test. Clinical correlation with patient history and other diagnostic information is necessary to determine patient infection status. The expected result is Negative.  Fact Sheet for Patients: EntrepreneurPulse.com.au  Fact Sheet for Healthcare Providers: IncredibleEmployment.be  This test is not yet approved or cleared by the [place] FDA and  has been authorized for detection and/or diagnosis of SARS-CoV-2 by FDA under an Emergency Use Authorization (EUA).  This EUA will remain in  effect (meaning this test can b e used) for the duration of  the COVID-19 declaration under Section 564(b)(1) of the Act, 21 U.S.C. section 360bbb-3(b)(1), unless the authorization is terminated or revoked sooner.     Influenza A by PCR NEGATIVE NEGATIVE Final   Influenza B by PCR NEGATIVE NEGATIVE Final    Comment: (NOTE) The Xpert Xpress SARS-CoV-2/FLU/RSV plus assay is intended as an aid in the diagnosis of influenza from Nasopharyngeal swab specimens and should not be used as a sole basis for treatment. Nasal washings and aspirates are unacceptable for Xpert Xpress SARS-CoV-2/FLU/RSV testing.  Fact Sheet for Patients: EntrepreneurPulse.com.au  Fact Sheet for Healthcare Providers: IncredibleEmployment.be  This test is not yet approved or cleared by the [place] FDA and has been authorized for detection and/or diagnosis of SARS-CoV-2 by FDA under an Emergency Use Authorization (EUA). This EUA will remain in effect (meaning this test can be used) for the duration of the COVID-19 declaration under Section 564(b)(1) of the Act, 21 U.S.C. section 360bbb-3(b)(1), unless the authorization is terminated or revoked.  Performed at [facility], [address], [place], [place] [address]       Imaging Studies   No results found.   Medications   Scheduled Meds:  ALPRAZolam  0.5 mg Oral QHS   apixaban  5 mg Oral BID   Chlorhexidine Gluconate Cloth  6 each Topical Daily   feeding supplement  1 Container Oral TID BM   fluticasone  2 spray Each Nare BID   folic acid  2,751 mcg Oral Daily   furosemide  40 mg Oral Daily   gabapentin  300 mg Oral BID AC & HS   hydroxychloroquine  200 mg Oral BID   metoprolol succinate  12.5 mg Oral QHS   mirabegron ER  50 mg Oral Daily   pantoprazole  40 mg Oral Daily   polyethylene glycol  17 g Oral QHS   pravastatin  20 mg Oral QHS   traZODone  100 mg Oral QHS   vitamin B-12  1,000 mcg Oral  Daily   Continuous Infusions:       LOS: 10 days    Time spent: 25 minutes with > 50% spent at bedside and in coordination of care      Ezekiel Slocumb, DO [facility]  09/21/2021, 2:28 PM      If 7PM-7AM, please contact night-coverage. How to contact the [facility] Attending or Consulting provider 7A -  7P or covering provider during after hours [place], for this patient?    Check the care team in [facility] and look for a) attending/consulting [facility] provider listed and b) the [facility] team listed Log into [email or web address] and use [place]'s universal password to access. If you do not have the password, please contact the hospital operator. Locate the [facility] provider you are looking for under [facility] and page to a number that you can be directly reached. If you still have difficulty reaching the provider, please page the [facility] (Director on Call) for the Hospitalists listed on amion for assistance.

## 2021-09-22 DIAGNOSIS — K5909 Other constipation: Secondary | ICD-10-CM

## 2021-09-22 LAB — COMPREHENSIVE METABOLIC PANEL
ALT: 23 U/L (ref 0–44)
AST: 32 U/L (ref 15–41)
Albumin: 2.6 g/dL — ABNORMAL LOW (ref 3.5–5.0)
Alkaline Phosphatase: 44 U/L (ref 38–126)
Anion gap: 4 — ABNORMAL LOW (ref 5–15)
BUN: 16 mg/dL (ref 8–23)
CO2: 28 mmol/L (ref 22–32)
Calcium: 7.8 mg/dL — ABNORMAL LOW (ref 8.9–10.3)
Chloride: 103 mmol/L (ref 98–111)
Creatinine, Ser: 1.1 mg/dL (ref 0.61–1.24)
GFR, Estimated: >60 mL/min (ref >60)
Glucose, Bld: 100 mg/dL — ABNORMAL HIGH (ref 70–99)
Potassium: 3.8 mmol/L (ref 3.5–5.1)
Sodium: 135 mmol/L (ref 135–145)
Total Bilirubin: 0.6 mg/dL (ref 0.3–1.2)
Total Protein: 5.3 g/dL — ABNORMAL LOW (ref 6.5–8.1)

## 2021-09-22 LAB — D-DIMER, QUANTITATIVE: D-Dimer, Quant: 0.82 ug/mL-FEU — ABNORMAL HIGH (ref 0.00–0.50)

## 2021-09-22 LAB — HEMOGLOBIN AND HEMATOCRIT, BLOOD
HCT: 26.4 % — ABNORMAL LOW (ref 39.0–52.0)
Hemoglobin: 9 g/dL — ABNORMAL LOW (ref 13.0–17.0)

## 2021-09-22 LAB — C-REACTIVE PROTEIN: CRP: 5.1 mg/dL — ABNORMAL HIGH (ref <1.0)

## 2021-09-22 NOTE — Assessment & Plan Note (Signed)
Continue Neurontin. 

## 2021-09-22 NOTE — Progress Notes (Addendum)
Vitals entered manually dynamap wouldn't recognize pt ID

## 2021-09-22 NOTE — Assessment & Plan Note (Signed)
Continue statin. 

## 2021-09-22 NOTE — Assessment & Plan Note (Signed)
In the setting of indwelling Foley and UTI.  Foley catheter was changed on admission.  Urine culture showed multiple organisms.  Completed antibiotics

## 2021-09-22 NOTE — Progress Notes (Signed)
  Progress Note    [name]   LGX:[id number]  DOB: 1934/06/07  DOA: 09/11/2021     11 Date of Service: 09/22/2021   Clinical Course  The patient is an 87 year old Caucasian male with a past medical history significant for 2 right renal nephrectomy given renal mass, chronic hyponatremia, chronic indwelling Foley catheter, chronic diastolic CHF, essential hypertension, persistent atrial fibrillation as well as other comorbidities who presented with hypotension initially.  Initial urinalysis showed blood but nitrates and leuk esterase was negative.  Initially antibiotics were held off but he spiked a temperature of 103 and blood cultures were drawn and E. coli was shown growing out of 1 out of 4 bottles.  His Foley catheter was changed out by urology and he is placed on IV ceftriaxone.  Nephrology been following given his AKI on admission and his IV fluids have now been stopped.  The E. coli bacteremia sensitivities came back and he is sensitive to cefazolin, Zosyn, imipenem, ciprofloxacin, and ceftriaxone which we will continue while hospitalized and change to po Levofloxacin at D/C.  We will obtain PT OT for further evaluate and treat and they are recommending SNF. His Renal Fxn is improved and he was stable to D/C to SNF however prior to D/C to SNF he tested Positive for COVID 19. His Inflammatory markers are elevated but He is asymptomatic.  10/26 - needs COVID related isolation for 10 Days and can go to SNF on Sep 28, 2021. -This is per SNF policy   Assessment and Plan * E coli bacteremia In the setting of indwelling Foley and UTI.  Foley catheter was changed on admission.  Urine culture showed multiple organisms.  Completed antibiotics  COVID-19 virus infection Incidental finding.  Patient is asymptomatic.  He needs to be isolated for 10 days per SNF policy before they would take him which would be on November 1  Acute kidney injury superimposed on CKD ([place]) Chronic indwelling Foley.   Kidney function back to baseline with hydration patient is status post right nephrectomy  Chronic pain syndrome Continue Neurontin  Rheumatoid arthritis involving multiple sites with positive rheumatoid factor (HCC) Continue hydroxychloroquine.  Follow-up with Dr. Jefm Bryant as an outpatient  Chronic constipation Resolved with bowel regimen  Hyperlipidemia Continue statin     Subjective:  Patient is feeling fine.  Denies any new issues.    Objective Vitals:   09/22/21 0812 09/22/21 1123 09/22/21 1453 09/22/21 1505  BP: (!) 151/66 (!) 141/62 120/67 (!) 115/54  Pulse: 68 69 79 80  Resp: 16 18    Temp: 98 F (36.7 C) 98.2 F (36.8 C)    TempSrc: Oral Oral    SpO2: 98% 98%  97%  Weight:       105.2 kg  Vital signs were reviewed and unremarkable.   Exam Physical Exam   General: Pt is alert, awake, not in acute distress Cardiovascular: RRR, S1/S2 +, no rubs, no gallops Respiratory: Diminished bilaterally, no wheezing, no rhonchi; Unlabored breathing and not wearing supplemental O2 via [place] Abdominal: Soft, NT, Distended 2/2 body habiuts, bowel sounds + Extremities:  Trace LE edema, no cyanosis  Labs / Other Information There are no new results to review at this time.   Disposition Plan: Status is: Inpatient  Remains inpatient appropriate because: Waiting for SNF till 10/1 due to COVID isolation policy   Time spent: 25 minutes [facility] 09/22/2021, 3:54 PM

## 2021-09-22 NOTE — Assessment & Plan Note (Addendum)
Incidental finding.  Patient is asymptomatic.  He needs to be isolated for 10 days per SNF policy before they would take him which would be on November 1

## 2021-09-22 NOTE — Assessment & Plan Note (Signed)
Continue hydroxychloroquine.  Follow-up with Dr. Jefm Bryant as an outpatient

## 2021-09-22 NOTE — Assessment & Plan Note (Signed)
Chronic indwelling Foley.  Kidney function back to baseline with hydration patient is status post right nephrectomy

## 2021-09-22 NOTE — Plan of Care (Signed)

## 2021-09-22 NOTE — Assessment & Plan Note (Signed)
Resolved with bowel regimen

## 2021-09-22 NOTE — Progress Notes (Addendum)
Physical Therapy Treatment Patient Details Name: [name] MRN: [id number] DOB: February 08, 1934 Today's Date: 09/22/2021   History of Present Illness [name]  is a 87 y.o. male with a known history of chronic diastolic congestive heart failure, indwelling Foley catheter, rheumatoid arthritis, chronic hyponatremia, chronic kidney disease. He has been exhausted lately and having trouble sleeping, and has been weak and struggling to get up and down. In the ER he was hypotensive with a blood pressure of 94/37 on 1 occasion.  He was dehydrated with a BUN of 36 and a creatinine up to 2.28.    PT Comments    Pt in bed upon entry, ready for PT and ready to work hard. Pt assisted with ROM and resisted exercises in bed, then physically assisted to EOB, then several variations of STS transfers, ultimately unable to utilize either UE in a meaningful capacity to offset severe leg weakness. With MaxA from elevated surface pt unable to fully rise to upright stance, maintained flexed knees. Pt has most success with lateral scoot transfers, but this requires an elevated surface and tibial block for confidence. Pt assisted with dry linen change and back to supine, then trunk brought up to 50 degrees for recliner-like posturing, slight bend in knee of bed, lowered FOB to offset tight hamstrings and reduce stress on sacrum and low back. BLE abducted slightly and erythematous scrotum brought forward for comfort to reduce impingement from thigh. Pt comfortable at EOS, assisted with setting up a DVD from home, will call RN when ready for reposition. Used blanket roll to liberate heels from mattress. Of note mild intermittent dizziness in session, consistent with BPPV (not tested), although very mild progressive BP drop over the course of 45 minutes of sitting, more consistent with cardiac-related orthostatic intolerance.    Recommendations for follow up therapy are one component of a multi-disciplinary discharge planning  process, led by the attending physician.  Recommendations may be updated based on patient status, additional functional criteria and insurance authorization.  Follow Up Recommendations  Skilled nursing-short term rehab (<3 hours/day)     Assistance Recommended at Discharge Intermittent Supervision/Assistance  Equipment Recommendations       Recommendations for Other Services       Precautions / Restrictions Precautions Precautions: Fall Restrictions Weight Bearing Restrictions: No     Mobility  Bed Mobility Overal bed mobility: Needs Assistance Bed Mobility: Rolling Rolling: Max assist   Supine to sit: Mod assist Sit to supine: Max assist   General bed mobility comments: tied a gait belt to FOB for pt to pull self to EOB    Transfers Overall transfer level: Needs assistance Equipment used: Rolling walker (2 wheels) (chair) Transfers: Sit to/from Stand;Lateral/Scoot Transfers Sit to Stand: Max assist;Total assist;From elevated surface          Lateral/Scoot Transfers: Min assist;From elevated surface      Ambulation/Gait Ambulation/Gait assistance:  (unable)               Stairs             Wheelchair Mobility    Modified Rankin (Stroke Patients Only)       Balance Overall balance assessment: Needs assistance Sitting-balance support: Single extremity supported;Feet supported Sitting balance-[name]: Fair                                      Cognition Arousal/Alertness: Awake/alert Behavior During Therapy:  WFL for tasks assessed/performed Overall Cognitive Status: Within Functional Limits for tasks assessed                                          Exercises Other Exercises Other Exercises: Heelslides 1x6 bilat (AA/ROM); AR/ROM leg press 1x6 bilat; hooklying bridge (HOB at 30 degrees; hooklying marching 1x12 alternating Other Exercises: seated EOB LAQ 1x5 bilat (unsuccessful due to severe  congentital hamstrings tightness and bialt knee weakness) Other Exercises: seated forward trunk flexion/hip hinge to x6 with chair in 2 different possitions for UE support Other Exercises: Rt lateral scooting with bilat knee block and forward weight shifting; needs step by step cues to perform with success    General Comments        Pertinent Vitals/Pain Pain Assessment: No/denies pain    Home Living                          Prior Function            PT Goals (current goals can now be found in the care plan section) Acute Rehab PT Goals Patient Stated Goal: to return home with family PT Goal Formulation: With patient Time For Goal Achievement: 09/26/21 Potential to Achieve Goals: Fair Progress towards PT goals: Not progressing toward goals - comment    Frequency    Min 2X/week      PT Plan Current plan remains appropriate    Co-evaluation              AM-PAC PT "6 Clicks" Mobility   Outcome Measure  Help needed turning from your back to your side while in a flat bed without using bedrails?: Total Help needed moving from lying on your back to sitting on the side of a flat bed without using bedrails?: Total Help needed moving to and from a bed to a chair (including a wheelchair)?: Total Help needed standing up from a chair using your arms (e.g., wheelchair or bedside chair)?: Total Help needed to walk in hospital room?: Total Help needed climbing 3-5 steps with a railing? : Total 6 Click Score: 6    End of Session Equipment Utilized During Treatment: Gait belt Activity Tolerance: Patient tolerated treatment well;Patient limited by fatigue Patient left: in bed;with call bell/phone within reach;with bed alarm set Nurse Communication: Mobility status PT Visit Diagnosis: Unsteadiness on feet (R26.81);Other abnormalities of gait and mobility (R26.89);Muscle weakness (generalized) (M62.81)     Time: 5053-9767 PT Time Calculation (min) (ACUTE ONLY):  63 min  Charges:  $Therapeutic Exercise: 23-37 mins $Therapeutic Activity: 23-37 mins                    4:35 PM, 09/22/21 Etta Grandchild, PT, DPT Physical Therapist - [facility]  [phone] ([place])     [place] C 09/22/2021, 4:31 PM

## 2021-09-22 NOTE — Hospital Course (Addendum)
The patient is an 87 year old Caucasian male with a past medical history significant for 2 right renal nephrectomy given renal mass, chronic hyponatremia, chronic indwelling Foley catheter, chronic diastolic CHF, essential hypertension, persistent atrial fibrillation as well as other comorbidities who presented with hypotension initially.  Initial urinalysis showed blood but nitrates and leuk esterase was negative.  Initially antibiotics were held off but he spiked a temperature of 103 and blood cultures were drawn and E. coli was shown growing out of 1 out of 4 bottles.  His Foley catheter was changed out by urology and he is placed on IV ceftriaxone.  Nephrology been following given his AKI on admission and his IV fluids have now been stopped.  The E. coli bacteremia sensitivities came back and he is sensitive to cefazolin, Zosyn, imipenem, ciprofloxacin, and ceftriaxone which we will continue while hospitalized and change to po Levofloxacin at D/C.  We will obtain PT OT for further evaluate and treat and they are recommending SNF. His Renal Fxn is improved and he was stable to D/C to SNF however prior to D/C to SNF he tested Positive for COVID 19. His Inflammatory markers are elevated but He is asymptomatic.  10/26-10/31 [name] SNF placement on 11/1

## 2021-09-23 ENCOUNTER — Encounter: Payer: Medicare PPO | Admitting: Internal Medicine

## 2021-09-23 LAB — COMPREHENSIVE METABOLIC PANEL
ALT: 22 U/L (ref 0–44)
AST: 29 U/L (ref 15–41)
Albumin: 2.7 g/dL — ABNORMAL LOW (ref 3.5–5.0)
Alkaline Phosphatase: 43 U/L (ref 38–126)
Anion gap: 8 (ref 5–15)
BUN: 15 mg/dL (ref 8–23)
CO2: 29 mmol/L (ref 22–32)
Calcium: 8 mg/dL — ABNORMAL LOW (ref 8.9–10.3)
Chloride: 99 mmol/L (ref 98–111)
Creatinine, Ser: 1.08 mg/dL (ref 0.61–1.24)
GFR, Estimated: >60 mL/min (ref >60)
Glucose, Bld: 88 mg/dL (ref 70–99)
Potassium: 3.5 mmol/L (ref 3.5–5.1)
Sodium: 136 mmol/L (ref 135–145)
Total Bilirubin: 0.8 mg/dL (ref 0.3–1.2)
Total Protein: 5.3 g/dL — ABNORMAL LOW (ref 6.5–8.1)

## 2021-09-23 LAB — D-DIMER, QUANTITATIVE: D-Dimer, Quant: 0.74 ug/mL-FEU — ABNORMAL HIGH (ref 0.00–0.50)

## 2021-09-23 LAB — C-REACTIVE PROTEIN: CRP: 4.3 mg/dL — ABNORMAL HIGH (ref <1.0)

## 2021-09-23 NOTE — Assessment & Plan Note (Signed)
Resolved with bowel regimen.

## 2021-09-23 NOTE — Assessment & Plan Note (Signed)
Continue hydroxychloroquine.  Follow-up with Dr. Jefm Bryant as an outpatient.

## 2021-09-23 NOTE — TOC Progression Note (Signed)
Transition of Care [facility]) - Progression Note    Patient Details  Name: OLSEN MCCUTCHAN MRN: [id number] Date of Birth: December 14, 1933  Transition of [facility]) CM/SW [place], RN Phone Number: 09/23/2021, 11:15 AM  Clinical Narrative:    Spoke with the patient and he confirms that he still plans to go to [facility] at [place], He will need an updated Ins approval, He would like to have a private room at [place] reached out to [employer] place to update, waiting on a responce   Expected Discharge Plan: [place] Barriers to Discharge: Continued Medical Work up  Expected Discharge Plan and Services Expected Discharge Plan: [place] arrangements for the past 2 months: Single Family Home Expected Discharge Date: 09/17/21                                     Social Determinants of Health (SDOH) Interventions    Readmission Risk Interventions Readmission Risk Prevention Plan 09/12/2021 06/23/2019  Transportation Screening Complete Complete  PCP or Specialist Appt within 3-5 Days - Complete  HRI or [place] - Complete  Social Work Consult for [place] Planning/Counseling - Complete  Palliative Care Screening - Not Applicable  Medication Review Press photographer) Complete Complete  PCP or Specialist appointment within 3-5 days of discharge Complete -  [place] or Home Care Consult Complete -  SW Recovery Care/Counseling Consult Complete -  Palliative Care Screening Not Applicable -  Skilled Nursing Facility Complete -  Some recent data might be hidden

## 2021-09-23 NOTE — Plan of Care (Signed)

## 2021-09-23 NOTE — Assessment & Plan Note (Signed)
Stable counts

## 2021-09-23 NOTE — Assessment & Plan Note (Signed)
Now resolved.  

## 2021-09-23 NOTE — Assessment & Plan Note (Signed)
Controlled rate on metoprolol, Eliquis for anticoagulation

## 2021-09-23 NOTE — Assessment & Plan Note (Signed)
Continue Neurontin. 

## 2021-09-23 NOTE — Assessment & Plan Note (Signed)
Incidental finding.  Patient is asymptomatic.  He needs to be isolated for 10 days per SNF policy before they would take him which would be on November 1.  He will go to [facility].  He is requesting private room.  TOC aware

## 2021-09-23 NOTE — Assessment & Plan Note (Signed)
In the setting of indwelling Foley and UTI.  Foley catheter was changed on admission.  Urine culture showed multiple organisms.  Completed antibiotics.

## 2021-09-23 NOTE — Progress Notes (Signed)
Progress Note    [name]   [id number]  DOB: 11-15-1934  DOA: 09/11/2021     12 Date of Service: 09/23/2021   Clinical Course  The patient is an 87 year old Caucasian male with a past medical history significant for 2 right renal nephrectomy given renal mass, chronic hyponatremia, chronic indwelling Foley catheter, chronic diastolic CHF, essential hypertension, persistent atrial fibrillation as well as other comorbidities who presented with hypotension initially.  Initial urinalysis showed blood but nitrates and leuk esterase was negative.  Initially antibiotics were held off but he spiked a temperature of 103 and blood cultures were drawn and E. coli was shown growing out of 1 out of 4 bottles.  His Foley catheter was changed out by urology and he is placed on IV ceftriaxone.  Nephrology been following given his AKI on admission and his IV fluids have now been stopped.  The E. coli bacteremia sensitivities came back and he is sensitive to cefazolin, Zosyn, imipenem, ciprofloxacin, and ceftriaxone which we will continue while hospitalized and change to po Levofloxacin at D/C.  We will obtain PT OT for further evaluate and treat and they are recommending SNF. His Renal Fxn is improved and he was stable to D/C to SNF however prior to D/C to SNF he tested Positive for COVID 19. His Inflammatory markers are elevated but He is asymptomatic.  10/26 - needs COVID related isolation for 10 Days and can go to SNF on Sep 28, 2021. -This is per SNF policy 28/31: Plan for [facility] rehab discharge on November 1   Assessment and Plan * E coli bacteremia In the setting of indwelling Foley and UTI.  Foley catheter was changed on admission.  Urine culture showed multiple organisms.  Completed antibiotics.  COVID-19 virus infection Incidental finding.  Patient is asymptomatic.  He needs to be isolated for 10 days per SNF policy before they would take him which would be on November 1.  He will go to [facility].  He is requesting private room.  TOC aware  Chronic pain syndrome Continue Neurontin.  Rheumatoid arthritis involving multiple sites with positive rheumatoid factor (HCC) Continue hydroxychloroquine.  Follow-up with Dr. Jefm Bryant as an outpatient.  Pancytopenia (HCC) Stable counts  AF (paroxysmal atrial fibrillation) (HCC) Controlled rate on metoprolol, Eliquis for anticoagulation  Chronic constipation Resolved with bowel regimen.  Fever Now resolved  Hyperlipidemia Continue statin.     Subjective:  He was somewhat upset for delay in nursing care.  No new issues  Objective Vitals:   09/22/21 1954 09/23/21 0613 09/23/21 0807 09/23/21 1215  BP: (!) 116/47 (!) 133/57 (!) 146/52 (!) 147/55  Pulse: 76 70 68 81  Resp: 18 20 15 15   Temp: (!) 97.5 F (36.4 C) 97.7 F (36.5 C) 98.2 F (36.8 C) 98.3 F (36.8 C)  TempSrc:  Oral    SpO2: 94% 95% 96% 97%  Weight:  104.2 kg     104.2 kg.    Vital signs were reviewed and unremarkable.   Exam Physical Exam   General: Pt is alert, awake, not in acute distress Cardiovascular: RRR, S1/S2 +, no rubs, no gallops Respiratory: Diminished bilaterally, no wheezing, no rhonchi; Unlabored breathing and not wearing supplemental O2 via [place] Abdominal: Soft, NT, Distended 2/2 body habiuts, bowel sounds + Extremities:  Trace LE edema, no cyanosis  Labs / Other Information There are no new results to review at this time.   Disposition Plan: Status is: Inpatient  Remains inpatient appropriate because: Waiting  for SNF placement till COVID isolation is over.  Schedule discharge on November 1    Time spent: 35 minutes [facility] 09/23/2021, 2:49 PM

## 2021-09-23 NOTE — Progress Notes (Signed)
Occupational Therapy Treatment Patient Details Name: [name] MRN: [id number] DOB: 1934/07/03 Today's Date: 09/23/2021   History of present illness [name]  is a 87 y.o. male with a known history of chronic diastolic congestive heart failure, indwelling Foley catheter, rheumatoid arthritis, chronic hyponatremia, chronic kidney disease. He has been exhausted lately and having trouble sleeping, and has been weak and struggling to get up and down. In the ER he was hypotensive with a blood pressure of 94/37 on 1 occasion.  He was dehydrated with a BUN of 36 and a creatinine up to 2.28.   OT comments  Pt seen for OT treatment on this date. Upon arrival to room, pt awake and seated upright in bed. Pt agreeable to OT tx and re-stating goal to be able to walk again. Pt educated on use of log roll technique for supine<>sit transfers in setting of limited AROM of shoulders, with pt requiring only MOD A this date. While seated EOB, pt put good effort to attempt LB dressing, however ultimately requiring MAX A to don/doff socks. While seated EOB, pt engaged in seated UB/LB therapy exercises (see below) to improve strength in preparation for functional tasks/OOB mobility. Pt performed lateral scoot toward HOB, requiring MIN A. Pt left in bed, with all needs within reach. Pt continues to benefit from skilled OT services to maximize return to PLOF and minimize risk of future falls, injury, caregiver burden, and readmission. Will continue to follow POC. Discharge recommendation remains appropriate.     Recommendations for follow up therapy are one component of a multi-disciplinary discharge planning process, led by the attending physician.  Recommendations may be updated based on patient status, additional functional criteria and insurance authorization.    Follow Up Recommendations  Skilled nursing-short term rehab (<3 hours/day)    Assistance Recommended at Discharge Frequent or constant  Supervision/Assistance  Equipment Recommendations  Other (comment) (defer to next venue of care)       Precautions / Restrictions Precautions Precautions: Fall Restrictions Weight Bearing Restrictions: No       Mobility Bed Mobility Overal bed mobility: Needs Assistance Bed Mobility: Rolling;Sidelying to Sit;Sit to Sidelying Rolling: Max assist Sidelying to sit: Mod assist     Sit to sidelying: Mod assist General bed mobility comments: Pt required MOD A for supine<>sit transfers via logroll technique; MOD A for trunk during supine>sit and MOD A for LE during sit>supine    Transfers Overall transfer level: Needs assistance   Transfers: Lateral/Scoot Transfers            Lateral/Scoot Transfers: Min assist;From elevated surface       Balance Overall balance assessment: Needs assistance Sitting-balance support: Single extremity supported;Feet supported Sitting balance-[name]: Fair Sitting balance - Comments: supervision static sitting EOB   Standing balance support: Bilateral upper extremity supported;During functional activity                               ADL either performed or assessed with clinical judgement   ADL Overall ADL's : Needs assistance/impaired                     Lower Body Dressing: Maximal assistance;Sitting/lateral leans Lower Body Dressing Details (indicate cue type and reason): to don socks                      Cognition Arousal/Alertness: Awake/alert Behavior During Therapy: WFL for tasks assessed/performed Overall  Cognitive Status: Within Functional Limits for tasks assessed                                            Exercises General Exercises - Upper Extremity Chair Push Up: Strengthening;Both;20 reps;Seated General Exercises - Lower Extremity Long Arc Quad: AROM;Both;20 reps;Seated Hip Flexion/Marching: AROM;Both;20 reps;Seated           Pertinent Vitals/ Pain       Pain  Assessment: 0-10 Pain Score: 5  Pain Location: back Pain Descriptors / Indicators: Aching Pain Intervention(s): Monitored during session;Repositioned;RN gave pain meds during session         Frequency  Min 1X/week        Progress Toward Goals  OT Goals(current goals can now be found in the care plan section)  Progress towards OT goals: Progressing toward goals  Acute Rehab OT Goals OT Goal Formulation: With patient Time For Goal Achievement: 09/26/21 Potential to Achieve Goals: [place] Discharge plan remains appropriate;Frequency remains appropriate       AM-PAC OT "6 Clicks" Daily Activity     Outcome Measure   Help from another person eating meals?: None Help from another person taking care of personal grooming?: A Lot Help from another person toileting, which includes using toliet, bedpan, or urinal?: A Lot Help from another person bathing (including washing, rinsing, drying)?: A Lot Help from another person to put on and taking off regular upper body clothing?: A Lot Help from another person to put on and taking off regular lower body clothing?: A Lot 6 Click Score: 14    End of Session    OT Visit Diagnosis: Muscle weakness (generalized) (M62.81);Unsteadiness on feet (R26.81)   Activity Tolerance Patient tolerated treatment well   Patient Left in bed;with call bell/phone within reach;with bed alarm set   Nurse Communication Mobility status        Time: 8469-6295 OT Time Calculation (min): 45 min  Charges: OT General Charges $OT Visit: 1 Visit OT Treatments $Self Care/Home Management : 8-22 mins $Therapeutic Activity: 23-37 mins  Fredirick Maudlin, OTR/L [place]

## 2021-09-23 NOTE — Assessment & Plan Note (Signed)
Continue statin. 

## 2021-09-24 LAB — C-REACTIVE PROTEIN: CRP: 3.1 mg/dL — ABNORMAL HIGH (ref <1.0)

## 2021-09-24 LAB — COMPREHENSIVE METABOLIC PANEL
ALT: 20 U/L (ref 0–44)
AST: 28 U/L (ref 15–41)
Albumin: 2.8 g/dL — ABNORMAL LOW (ref 3.5–5.0)
Alkaline Phosphatase: 42 U/L (ref 38–126)
Anion gap: 8 (ref 5–15)
BUN: 15 mg/dL (ref 8–23)
CO2: 28 mmol/L (ref 22–32)
Calcium: 8.2 mg/dL — ABNORMAL LOW (ref 8.9–10.3)
Chloride: 100 mmol/L (ref 98–111)
Creatinine, Ser: 1.04 mg/dL (ref 0.61–1.24)
GFR, Estimated: >60 mL/min (ref >60)
Glucose, Bld: 97 mg/dL (ref 70–99)
Potassium: 3.5 mmol/L (ref 3.5–5.1)
Sodium: 136 mmol/L (ref 135–145)
Total Bilirubin: 0.8 mg/dL (ref 0.3–1.2)
Total Protein: 5.5 g/dL — ABNORMAL LOW (ref 6.5–8.1)

## 2021-09-24 LAB — D-DIMER, QUANTITATIVE: D-Dimer, Quant: 0.64 ug/mL-FEU — ABNORMAL HIGH (ref 0.00–0.50)

## 2021-09-24 MED ORDER — INFLUENZA VAC A&B SA ADJ QUAD 0.5 ML IM PRSY
0.5000 mL | PREFILLED_SYRINGE | INTRAMUSCULAR | Status: AC
  Administered 2021-09-27: 0.5 mL via INTRAMUSCULAR
  Filled 2021-09-24 (×2): qty 0.5

## 2021-09-24 NOTE — Assessment & Plan Note (Signed)
Stable counts.

## 2021-09-24 NOTE — Assessment & Plan Note (Signed)
Continue Neurontin. 

## 2021-09-24 NOTE — Assessment & Plan Note (Addendum)
In the setting of indwelling Foley and UTI.  Foley catheter was changed on admission.  Urine culture showed multiple organisms.  Completed antibiotics

## 2021-09-24 NOTE — Progress Notes (Signed)
Physical Therapy Treatment Patient Details Name: [name] MRN: [id number] DOB: [birth year] Today's Date: 09/24/2021   History of Present Illness [name]  is a 87 y.o. male with a known history of chronic diastolic congestive heart failure, indwelling Foley catheter, rheumatoid arthritis, chronic hyponatremia, chronic kidney disease. He has been exhausted lately and having trouble sleeping, and has been weak and struggling to get up and down. In the ER he was hypotensive with a blood pressure of 94/37 on 1 occasion.  He was dehydrated with a BUN of 36 and a creatinine up to 2.28.    PT Comments    Pt resting in bed upon PT arrival; agreeable to PT session.  Mod to max assist with bed mobility; able to come to 3/4th stand with max assist x1 (use of RW and bed height elevated); able to come to almost full stand using RW with mod to max assist x2 (bed height elevated) x3 trials.  Tolerated sitting LE ex's on edge of bed fairly well.  Will continue to focus on strengthening and progressive functional mobility per pt tolerance.   Recommendations for follow up therapy are one component of a multi-disciplinary discharge planning process, led by the attending physician.  Recommendations may be updated based on patient status, additional functional criteria and insurance authorization.  Follow Up Recommendations  Skilled nursing-short term rehab (<3 hours/day)     Assistance Recommended at Discharge Intermittent Supervision/Assistance  Equipment Recommendations   (TBD at next venue of care)    Recommendations for Other Services       Precautions / Restrictions Precautions Precautions: Fall Restrictions Weight Bearing Restrictions: No     Mobility  Bed Mobility Overal bed mobility: Needs Assistance Bed Mobility: Supine to Sit;Sit to Supine     Supine to sit: Mod assist Sit to supine: Max assist   General bed mobility comments: assist for trunk and B LE's; vc's for technique; 2  assist to boost pt up in bed end of session using bed sheet    Transfers Overall transfer level: Needs assistance Equipment used: Rolling walker (2 wheels) Transfers: Sit to/from Stand Sit to Stand: Mod assist;Max assist;+2 physical assistance;From elevated surface           General transfer comment: x1 trial with max assist x1 (able to achieve about 3/4th stand); x3 trials with mod to max assist x2 (able to achieve almost upright posture with assist and cueing for upright posture); use of RW    Ambulation/Gait             General Gait Details: not appropriate at this time d/t overall weakness   Stairs             Wheelchair Mobility    Modified Rankin (Stroke Patients Only)       Balance Overall balance assessment: Needs assistance Sitting-balance support: No upper extremity supported;Feet supported Sitting balance-[name]: Good Sitting balance - Comments: steady sitting reaching within BOS   Standing balance support: Bilateral upper extremity supported Standing balance-[name]: Poor Standing balance comment: assist to maintain standing posture with cueing for positioning; use of RW                            Cognition Arousal/Alertness: Awake/alert Behavior During Therapy: WFL for tasks assessed/performed Overall Cognitive Status: Within Functional Limits for tasks assessed  General Comments: Verbose and tangential conversation.        Exercises General Exercises - Lower Extremity Ankle Circles/Pumps: AROM;Strengthening;Both;10 reps;Seated Long Arc Quad: AROM;Strengthening;Both;10 reps;Seated Hip Flexion/Marching: AROM;Strengthening;Both;10 reps;Seated    General Comments  Nursing cleared pt for participation in physical therapy.  Pt agreeable to PT session.      Pertinent Vitals/Pain Pain Assessment: Faces Faces Pain Scale: No hurt Pain Intervention(s): Limited activity within  patient's tolerance;Monitored during session;Repositioned Vitals (HR and O2 on room air) stable and WFL throughout treatment session.    Home Living                          Prior Function            PT Goals (current goals can now be found in the care plan section) Acute Rehab PT Goals Patient Stated Goal: to return home with family PT Goal Formulation: With patient Time For Goal Achievement: 09/26/21 Potential to Achieve Goals: Fair Progress towards PT goals: Progressing toward goals    Frequency    Min 2X/week      PT Plan Current plan remains appropriate    Co-evaluation              AM-PAC PT "6 Clicks" Mobility   Outcome Measure  Help needed turning from your back to your side while in a flat bed without using bedrails?: A Lot Help needed moving from lying on your back to sitting on the side of a flat bed without using bedrails?: A Lot Help needed moving to and from a bed to a chair (including a wheelchair)?: Total Help needed standing up from a chair using your arms (e.g., wheelchair or bedside chair)?: Total Help needed to walk in hospital room?: Total Help needed climbing 3-5 steps with a railing? : Total 6 Click Score: 8    End of Session Equipment Utilized During Treatment: Gait belt Activity Tolerance: Patient tolerated treatment well;Patient limited by fatigue Patient left: in bed;with call bell/phone within reach;with bed alarm set;Other (comment) (B heels floating via pillow support) Nurse Communication: Mobility status;Precautions PT Visit Diagnosis: Unsteadiness on feet (R26.81);Other abnormalities of gait and mobility (R26.89);Muscle weakness (generalized) (M62.81)     Time: 3500-9381 PT Time Calculation (min) (ACUTE ONLY): 38 min  Charges:  $Therapeutic Exercise: 8-22 mins $Therapeutic Activity: 23-37 mins                     [name], PT 09/24/21, 5:26 PM

## 2021-09-24 NOTE — Assessment & Plan Note (Signed)
Resolved with bowel regimen.  Continue stool softener as needed

## 2021-09-24 NOTE — Progress Notes (Signed)
Progress Note    [name]   [id number]  DOB: 1934/11/01  DOA: 09/11/2021     13 Date of Service: 09/24/2021   Clinical Course  The patient is an 87 year old Caucasian male with a past medical history significant for 2 right renal nephrectomy given renal mass, chronic hyponatremia, chronic indwelling Foley catheter, chronic diastolic CHF, essential hypertension, persistent atrial fibrillation as well as other comorbidities who presented with hypotension initially.  Initial urinalysis showed blood but nitrates and leuk esterase was negative.  Initially antibiotics were held off but he spiked a temperature of 103 and blood cultures were drawn and E. coli was shown growing out of 1 out of 4 bottles.  His Foley catheter was changed out by urology and he is placed on IV ceftriaxone.  Nephrology been following given his AKI on admission and his IV fluids have now been stopped.  The E. coli bacteremia sensitivities came back and he is sensitive to cefazolin, Zosyn, imipenem, ciprofloxacin, and ceftriaxone which we will continue while hospitalized and change to po Levofloxacin at D/C.  We will obtain PT OT for further evaluate and treat and they are recommending SNF. His Renal Fxn is improved and he was stable to D/C to SNF however prior to D/C to SNF he tested Positive for COVID 19. His Inflammatory markers are elevated but He is asymptomatic.  10/26 - needs COVID related isolation for 10 Days and can go to SNF on Sep 28, 2021. -This is per SNF policy 17/79: Plan for [facility] rehab discharge on November 1 10/28: Requesting flu shot   Assessment and Plan * E coli bacteremia In the setting of indwelling Foley and UTI.  Foley catheter was changed on admission.  Urine culture showed multiple organisms.  Completed antibiotics  COVID-19 virus infection Incidental finding.  Patient is asymptomatic.  He needs to be isolated for 10 days per SNF policy before they would take him which would be on November  1.  He will go to [facility].  He is requesting private room which has been set per TOC.  Patient requesting influenza vaccination while here which I have ordered  Chronic pain syndrome Continue Neurontin  Rheumatoid arthritis involving multiple sites with positive rheumatoid factor (HCC) Continue hydroxychloroquine.  Follow-up with Dr. Jefm Bryant as an outpatient  Pancytopenia ([place]) Stable counts.  AF (paroxysmal atrial fibrillation) (HCC) Controlled rate on metoprolol, Eliquis for anticoagulation.  Chronic constipation Resolved with bowel regimen.  Continue stool softener as needed  Hyperlipidemia Continue statin     Subjective:  Requesting flu shot while here.  He likes to talk.  Very pleasant  Objective Vitals:   09/23/21 1543 09/23/21 2120 09/24/21 0539 09/24/21 0824  BP: (!) 126/56 (!) 138/52 (!) 136/49 (!) 148/64  Pulse: 75 68 80 78  Resp: 15 18 18 17   Temp: 98.4 F (36.9 C) 98.1 F (36.7 C) 98.1 F (36.7 C) 98.1 F (36.7 C)  TempSrc:      SpO2: 96% 97% 96% 93%  Weight:       104.2 kg  Vital signs were reviewed and unremarkable.   Exam Physical Exam   General: Pt is alert, awake, not in acute distress Cardiovascular: RRR, S1/S2 +, no rubs, no gallops Respiratory: Diminished bilaterally, no wheezing, no rhonchi; Unlabored breathing and not wearing supplemental O2 via [place] Abdominal: Soft, NT, Distended 2/2 body habiuts, bowel sounds + Extremities: 1+ LE edema, no cyanosis  Labs / Other Information There are no new results to review at  this time.   Disposition Plan: Status is: Inpatient  Remains inpatient appropriate because: Waiting for SNF.  [facility] on 11/1     Time spent: 25 minutes [facility] 09/24/2021, 12:49 PM

## 2021-09-24 NOTE — Assessment & Plan Note (Addendum)
Incidental finding.  Patient is asymptomatic.  He needs to be isolated for 10 days per SNF policy before they would take him which would be on November 1.  He will go to [facility].  He is requesting private room which has been set per TOC.  Patient requesting influenza vaccination while here which I have ordered

## 2021-09-24 NOTE — Assessment & Plan Note (Signed)
Continue hydroxychloroquine.  Follow-up with Dr. Jefm Bryant as an outpatient

## 2021-09-24 NOTE — Assessment & Plan Note (Signed)
Controlled rate on metoprolol, Eliquis for anticoagulation.

## 2021-09-24 NOTE — Assessment & Plan Note (Signed)
Continue statin. 

## 2021-09-25 LAB — C-REACTIVE PROTEIN: CRP: 2.9 mg/dL — ABNORMAL HIGH (ref <1.0)

## 2021-09-25 LAB — D-DIMER, QUANTITATIVE: D-Dimer, Quant: 0.53 ug/mL-FEU — ABNORMAL HIGH (ref 0.00–0.50)

## 2021-09-25 NOTE — Assessment & Plan Note (Signed)
Resolved with bowel regimen.  Now reports more frequency of stool but no diarrhea

## 2021-09-25 NOTE — Assessment & Plan Note (Addendum)
Now resolved.  

## 2021-09-25 NOTE — Assessment & Plan Note (Signed)
Stable counts

## 2021-09-25 NOTE — Assessment & Plan Note (Signed)
Incidental finding.  Patient is asymptomatic.  He needs to be isolated for 10 days per SNF policy before they would take him which would be on November 1.  He will go to [facility].  He is requesting private room which has been set per TOC.  Patient requesting influenza vaccination while here which I have ordered.

## 2021-09-25 NOTE — Progress Notes (Signed)
Progress Note    [name]   [id number]  DOB: 1934/01/31  DOA: 09/11/2021     14 Date of Service: 09/25/2021   Clinical Course  The patient is an 87 year old Caucasian male with a past medical history significant for 2 right renal nephrectomy given renal mass, chronic hyponatremia, chronic indwelling Foley catheter, chronic diastolic CHF, essential hypertension, persistent atrial fibrillation as well as other comorbidities who presented with hypotension initially.  Initial urinalysis showed blood but nitrates and leuk esterase was negative.  Initially antibiotics were held off but he spiked a temperature of 103 and blood cultures were drawn and E. coli was shown growing out of 1 out of 4 bottles.  His Foley catheter was changed out by urology and he is placed on IV ceftriaxone.  Nephrology been following given his AKI on admission and his IV fluids have now been stopped.  The E. coli bacteremia sensitivities came back and he is sensitive to cefazolin, Zosyn, imipenem, ciprofloxacin, and ceftriaxone which we will continue while hospitalized and change to po Levofloxacin at D/C.  We will obtain PT OT for further evaluate and treat and they are recommending SNF. His Renal Fxn is improved and he was stable to D/C to SNF however prior to D/C to SNF he tested Positive for COVID 19. His Inflammatory markers are elevated but He is asymptomatic.  10/26 - needs COVID related isolation for 10 Days and can go to SNF on Sep 28, 2021. -This is per SNF policy 82/50: Plan for [facility] rehab discharge on November 1 10/28: Requesting flu shot 10/29: Reports more frequency of stool but no diarrhea.  Hoping for discharge on 11/1  Assessment and Plan * E coli bacteremia In the setting of indwelling Foley and UTI.  Foley catheter was changed on admission.  Urine culture showed multiple organisms.  Completed antibiotics.  COVID-19 virus infection Incidental finding.  Patient is asymptomatic.  He needs to be  isolated for 10 days per SNF policy before they would take him which would be on November 1.  He will go to [facility]  He is requesting private room which has been set per TOC.  Patient requesting influenza vaccination while here which I have ordered.  Acute kidney injury superimposed on CKD (HCC) Chronic indwelling Foley.  Kidney function back to baseline with hydration patient is status post right nephrectomy.  Chronic pain syndrome Continue Neurontin.  Rheumatoid arthritis involving multiple sites with positive rheumatoid factor (HCC) Continue hydroxychloroquine.  Follow-up with Dr. Jefm Bryant as an outpatient.  Pancytopenia (HCC) Stable counts  AF (paroxysmal atrial fibrillation) (HCC) Controlled rate on metoprolol, Eliquis for anticoagulation  Chronic constipation Resolved with bowel regimen.  Now reports more frequency of stool but no diarrhea  Fever Now resolved.  Hyperlipidemia Continue statin.     Subjective:  Reports more frequent stool but not having diarrhea and not loose consistency.  Requesting therapy while here on the weekend if possible  Objective Vitals:   09/24/21 2304 09/25/21 0609 09/25/21 0609 09/25/21 0802  BP: 135/63  (!) 162/53 (!) 151/57  Pulse: 71  67 72  Resp: 20  20 16   Temp: 98.2 F (36.8 C)  98.3 F (36.8 C) 98.1 F (36.7 C)  TempSrc: Oral  Oral   SpO2: 97%  97% 97%  Weight:  103 kg     103 kg  Vital signs were reviewed and unremarkable.   Exam Physical Exam   General: Pt is alert, awake, not in acute distress Cardiovascular:  RRR, S1/S2 +, no rubs, no gallops Respiratory: Diminished bilaterally, no wheezing, no rhonchi; Unlabored breathing and not wearing supplemental O2 via [place] Abdominal: Soft, NT, Distended 2/2 body habiuts, bowel sounds + Extremities: 1+ LE edema, no cyanosis  Labs / Other Information My review of labs, imaging, notes and other tests shows no new significant findings.    Disposition Plan: Status is:  Inpatient  Remains inpatient appropriate because: SNF on 11/1     Time spent: 25 minutes [facility] 09/25/2021, 12:41 PM

## 2021-09-25 NOTE — Assessment & Plan Note (Signed)
Controlled rate on metoprolol, Eliquis for anticoagulation

## 2021-09-25 NOTE — Assessment & Plan Note (Signed)
Continue statin. 

## 2021-09-25 NOTE — Assessment & Plan Note (Signed)
Chronic indwelling Foley.  Kidney function back to baseline with hydration patient is status post right nephrectomy.

## 2021-09-25 NOTE — Assessment & Plan Note (Signed)
Continue hydroxychloroquine.  Follow-up with Dr. Jefm Bryant as an outpatient.

## 2021-09-25 NOTE — Assessment & Plan Note (Signed)
Continue Neurontin. 

## 2021-09-25 NOTE — Assessment & Plan Note (Signed)
In the setting of indwelling Foley and UTI.  Foley catheter was changed on admission.  Urine culture showed multiple organisms.  Completed antibiotics.

## 2021-09-26 LAB — D-DIMER, QUANTITATIVE: D-Dimer, Quant: 0.49 ug/mL-FEU (ref 0.00–0.50)

## 2021-09-26 LAB — C-REACTIVE PROTEIN: CRP: 2.4 mg/dL — ABNORMAL HIGH (ref <1.0)

## 2021-09-26 NOTE — Assessment & Plan Note (Signed)
Controlled rate on metoprolol, Eliquis for anticoagulation.

## 2021-09-26 NOTE — Assessment & Plan Note (Signed)
Now resolved.  

## 2021-09-26 NOTE — Assessment & Plan Note (Signed)
In the setting of indwelling Foley and UTI.  Foley catheter was changed on admission.  Urine culture showed multiple organisms.  Completed antibiotics

## 2021-09-26 NOTE — Progress Notes (Signed)
Progress Note    [name]   [id number]  DOB: 03/12/1934  DOA: 09/11/2021     15 Date of Service: 09/26/2021   Clinical Course  The patient is an 87 year old Caucasian male with a past medical history significant for 2 right renal nephrectomy given renal mass, chronic hyponatremia, chronic indwelling Foley catheter, chronic diastolic CHF, essential hypertension, persistent atrial fibrillation as well as other comorbidities who presented with hypotension initially.  Initial urinalysis showed blood but nitrates and leuk esterase was negative.  Initially antibiotics were held off but he spiked a temperature of 103 and blood cultures were drawn and E. coli was shown growing out of 1 out of 4 bottles.  His Foley catheter was changed out by urology and he is placed on IV ceftriaxone.  Nephrology been following given his AKI on admission and his IV fluids have now been stopped.  The E. coli bacteremia sensitivities came back and he is sensitive to cefazolin, Zosyn, imipenem, ciprofloxacin, and ceftriaxone which we will continue while hospitalized and change to po Levofloxacin at D/C.  We will obtain PT OT for further evaluate and treat and they are recommending SNF. His Renal Fxn is improved and he was stable to D/C to SNF however prior to D/C to SNF he tested Positive for COVID 19. His Inflammatory markers are elevated but He is asymptomatic.  10/26-10/30 [name] SNF placement on 11/1   Assessment and Plan * E coli bacteremia In the setting of indwelling Foley and UTI.  Foley catheter was changed on admission.  Urine culture showed multiple organisms.  Completed antibiotics  COVID-19 virus infection Incidental finding.  Patient is asymptomatic.  He needs to be isolated for 10 days per SNF policy before they would take him which would be on November 1.  He will go to [facility].  He is requesting private room which has been set per TOC.  Patient requesting influenza vaccination while  here which I have ordered  Acute kidney injury superimposed on CKD ([place]) Chronic indwelling Foley.  Kidney function back to baseline with hydration patient is status post right nephrectomy  Chronic pain syndrome Continue Neurontin  Rheumatoid arthritis involving multiple sites with positive rheumatoid factor (HCC) Continue hydroxychloroquine.  Follow-up with Dr. Jefm Bryant as an outpatient  Pancytopenia ([place]) Stable counts.  AF (paroxysmal atrial fibrillation) (HCC) Controlled rate on metoprolol, Eliquis for anticoagulation.  Chronic constipation Resolved with bowel regimen.   Fever Now resolved  Hyperlipidemia Continue statin     Subjective:  No new issues  Objective Vitals:   09/25/21 1642 09/25/21 1942 09/26/21 0538 09/26/21 0832  BP: (!) 124/51 (!) 134/51 (!) 127/52 (!) 138/55  Pulse: 71 79 72 69  Resp: 16 18 18 16   Temp: 98.2 F (36.8 C) 98.6 F (37 C) 97.7 F (36.5 C) (!) 97.4 F (36.3 C)  TempSrc:      SpO2: 97% 94% 94% 91%  Weight:      Height:       103 kg  Vital signs were reviewed and unremarkable.   Exam Physical Exam   General: Pt is alert, awake, not in acute distress Cardiovascular: RRR, S1/S2 +, no rubs, no gallops Respiratory: Diminished bilaterally, no wheezing, no rhonchi; Unlabored breathing and not wearing supplemental O2 via [place] Abdominal: Soft, NT, Distended 2/2 body habiuts, bowel sounds + Extremities: 1+ LE edema, no cyanosis  Labs / Other Information My review of labs, imaging, notes and other tests shows no new significant findings.  Disposition Plan: Status is: Inpatient  Remains inpatient appropriate because: Pending SNF discharge on 11/1        Time spent: 20 minutes [facility] 09/26/2021, 11:38 AM

## 2021-09-26 NOTE — Assessment & Plan Note (Signed)
Stable counts.

## 2021-09-26 NOTE — Assessment & Plan Note (Signed)
Continue statin. 

## 2021-09-26 NOTE — Plan of Care (Signed)

## 2021-09-26 NOTE — Assessment & Plan Note (Signed)
Chronic indwelling Foley.  Kidney function back to baseline with hydration patient is status post right nephrectomy

## 2021-09-26 NOTE — Assessment & Plan Note (Signed)
Continue hydroxychloroquine.  Follow-up with Dr. Jefm Bryant as an outpatient

## 2021-09-26 NOTE — Assessment & Plan Note (Signed)
Resolved with bowel regimen.

## 2021-09-26 NOTE — Assessment & Plan Note (Signed)
Continue Neurontin. 

## 2021-09-26 NOTE — Assessment & Plan Note (Signed)
Incidental finding.  Patient is asymptomatic.  He needs to be isolated for 10 days per SNF policy before they would take him which would be on November 1.  He will go to [facility].  He is requesting private room which has been set per TOC.  Patient requesting influenza vaccination while here which I have ordered

## 2021-09-27 LAB — D-DIMER, QUANTITATIVE: D-Dimer, Quant: 0.46 ug/mL-FEU (ref 0.00–0.50)

## 2021-09-27 LAB — C-REACTIVE PROTEIN: CRP: 2.1 mg/dL — ABNORMAL HIGH (ref <1.0)

## 2021-09-27 MED ORDER — METOPROLOL SUCCINATE ER 25 MG PO TB24: 12.5000 mg | ORAL_TABLET | Freq: Every day | ORAL | Status: DC

## 2021-09-27 NOTE — Assessment & Plan Note (Signed)
Controlled rate on metoprolol, Eliquis for anticoagulation.

## 2021-09-27 NOTE — Care Management Important Message (Signed)
Important Message  Patient Details  Name: MCKALE HAFFEY MRN: [id number] Date of Birth: January 27, 1934   Medicare Important Message Given:  Other (see comment)  Patient is in an isolation room so I called to review the Important Message from Medicare with by phone [phone]) but there was no answer. I will try calling again in the morning.    Juliann Pulse A Anuar Walgren 09/27/2021, 3:24 PM

## 2021-09-27 NOTE — Assessment & Plan Note (Signed)
Continue statin. 

## 2021-09-27 NOTE — Plan of Care (Signed)

## 2021-09-27 NOTE — TOC Progression Note (Signed)
Transition of Care [facility]) - Progression Note    Patient Details  Name: LAW CORSINO MRN: [id number] Date of Birth: 1934-04-21  Transition of Care [facility]) CM/SW [place], RN Phone Number: 09/27/2021, 1:39 PM  Clinical Narrative:   Plan for the patient to go to [facility] tomorrow, Resubmitted clinical notes to insurance for approval    Expected Discharge Plan: [place] Barriers to Discharge: Continued Medical Work up  Expected Discharge Plan and Services Expected Discharge Plan: [place] arrangements for the past 2 months: Single Family Home Expected Discharge Date: 09/17/21                                     Social Determinants of Health (SDOH) Interventions    Readmission Risk Interventions Readmission Risk Prevention Plan 09/12/2021 06/23/2019  Transportation Screening Complete Complete  PCP or Specialist Appt within 3-5 Days - Complete  HRI or [place] - Complete  Social Work Consult for [place] Planning/Counseling - Complete  Palliative Care Screening - Not Applicable  Medication Review Press photographer) Complete Complete  PCP or Specialist appointment within 3-5 days of discharge Complete -  [place] or Home Care Consult Complete -  SW Recovery Care/Counseling Consult Complete -  Palliative Care Screening Not Applicable -  Skilled Nursing Facility Complete -  Some recent data might be hidden

## 2021-09-27 NOTE — Evaluation (Signed)
Physical Therapy RE-Evaluation Patient Details Name: [name] MRN: [id number] DOB: 02-24-1934 Today's Date: 09/27/2021  History of Present Illness  [name]  is a 87 y.o. male with a known history of chronic diastolic congestive heart failure, indwelling Foley catheter, rheumatoid arthritis, chronic hyponatremia, chronic kidney disease. He has been exhausted lately and having trouble sleeping, and has been weak and struggling to get up and down. In the ER he was hypotensive with a blood pressure of 94/37 on 1 occasion.  He was dehydrated with a BUN of 36 and a creatinine up to 2.28. Found to be COVID + 09/17/21.  Clinical Impression  Patient tolerated session well, and was agreeable to treatment. Due to patients extended stay patient was re-evaluated. Patient continues to require Mod A with bed mobility, with assistance at the trunk and UE. Bilateral lower extremity remains impaired due to BLE weakness. Patient was able to tolerate L lateral side stepping for up to 1 minute with B knees blocked and RW at Mod A x2. Patient would continue to benefit from skilled physical therapy in order to increase independence with activities of daily living. POC reviewed and updated.      Recommendations for follow up therapy are one component of a multi-disciplinary discharge planning process, led by the attending physician.  Recommendations may be updated based on patient status, additional functional criteria and insurance authorization.  Follow Up Recommendations Skilled nursing-short term rehab (<3 hours/day)    Assistance Recommended at Discharge Intermittent Supervision/Assistance  Functional Status Assessment Patient has had a recent decline in their functional status and demonstrates the ability to make significant improvements in function in a reasonable and predictable amount of time.  Equipment Recommendations  Other (comment) (TBD at next venue of care)    Recommendations for Other Services        Precautions / Restrictions Precautions Precautions: Fall Restrictions Weight Bearing Restrictions: No      Mobility  Bed Mobility Overal bed mobility: Needs Assistance Bed Mobility: Supine to Sit;Sit to Supine Rolling: Mod assist Sidelying to sit: Mod assist Supine to sit: Max assist Sit to supine: Mod assist Sit to sidelying: Mod assist General bed mobility comments: assist at trunk and BLEs; verbal cueing on hand placement; x2 assist to boost patient up in bed at end of session using bed sheet    Transfers Overall transfer level: Needs assistance Equipment used: Rolling walker (2 wheels) Transfers: Sit to/from Stand Sit to Stand: +2 physical assistance;From elevated surface;Mod assist         General transfer comment: STS x5 from elevated bed height using RW for BUE support and B knee block; pt tolerated 30seconds to a minute each trial; achieved lateral side stepping to the L on last trail x5 feet with B knee block Mod Ax2    Ambulation/Gait             General Gait Details: not appropriate at this time d/t overall weakness  Stairs            Wheelchair Mobility    Modified Rankin (Stroke Patients Only)       Balance Overall balance assessment: Needs assistance Sitting-balance support: No upper extremity supported;Feet supported Sitting balance-[name]: Good Sitting balance - Comments: steady sitting reaching within BOS Postural control: Posterior lean Standing balance support: Bilateral upper extremity supported Standing balance-[name]: Poor Standing balance comment: assist to maintain standing posture with cueing for positioning  Pertinent Vitals/Pain Pain Assessment: 0-10 Pain Score:  (No pain at the initiation of the session, patient reported inrease in R hip pain upon sitting 5/10, however at conclusion of sessio patient reported no pain.) Faces Pain Scale: Hurts little more Pain  Location: back Pain Descriptors / Indicators: Aching Pain Intervention(s): Monitored during session;Repositioned    Home Living Family/patient expects to be discharged to:: Skilled nursing facility Living Arrangements: Spouse/significant other;Children Available Help at Discharge: Family;Available 24 hours/day Type of Home: House Home Access: Stairs to enter Entrance Stairs-Rails: None Entrance Stairs-Number of Steps: 3   Home Layout: Two level;Able to live on main level with bedroom/bathroom Home Equipment: Rolling Walker (2 wheels)      Prior Function                       Hand Dominance   Dominant Hand: Right    Extremity/Trunk Assessment   Upper Extremity Assessment Upper Extremity Assessment: Defer to OT evaluation RUE Deficits / Details: Shoulder flexion AROM 0-30 (aprox) degrees; Elbow flexion/extension 3/5; Grip strength 4/5 LUE Deficits / Details: Shoulder flexion AROM 0-30 (aprox) degrees; Elbow flexion/extension 3/5    Lower Extremity Assessment Lower Extremity Assessment: Generalized weakness RLE Deficits / Details: Decreased full AROM/PROM in all joints of LE.       Communication   Communication: No difficulties  Cognition Arousal/Alertness: Awake/alert Behavior During Therapy: WFL for tasks assessed/performed Overall Cognitive Status: Within Functional Limits for tasks assessed                                 General Comments: .        General Comments      Exercises General Exercises - Lower Extremity Long Arc Quad: AROM;Strengthening;Both;10 reps;Seated Hip Flexion/Marching: AROM;Strengthening;Both;Seated;15 reps    Assessment/Plan    PT Assessment Patient needs continued PT services  PT Problem List Decreased strength;Decreased range of motion;Decreased activity tolerance;Decreased balance;Decreased mobility;Decreased coordination;Decreased cognition;Decreased knowledge of use of DME;Decreased safety  awareness;Decreased knowledge of precautions       PT Treatment Interventions DME instruction;Gait training;Stair training;Functional mobility training;Therapeutic activities;Therapeutic exercise;Balance training;Neuromuscular re-education;Patient/family education;Wheelchair mobility training;Manual techniques;Modalities    PT Goals (Current goals can be found in the Care Plan section)  Acute Rehab PT Goals Patient Stated Goal: to return home with family PT Goal Formulation: With patient Time For Goal Achievement: 10/11/21 Potential to Achieve Goals: Fair    Frequency Min 2X/week   Barriers to discharge        Co-evaluation   Reason for Co-Treatment: For patient/therapist safety PT goals addressed during session: Mobility/safety with mobility;Balance;Strengthening/ROM         AM-PAC PT "6 Clicks" Mobility  Outcome Measure Help needed turning from your back to your side while in a flat bed without using bedrails?: A Lot Help needed moving from lying on your back to sitting on the side of a flat bed without using bedrails?: A Lot Help needed moving to and from a bed to a chair (including a wheelchair)?: Total Help needed standing up from a chair using your arms (e.g., wheelchair or bedside chair)?: Total Help needed to walk in hospital room?: Total Help needed climbing 3-5 steps with a railing? : Total 6 Click Score: 8    End of Session Equipment Utilized During Treatment: Gait belt Activity Tolerance: Patient tolerated treatment well;Patient limited by fatigue Patient left: in bed;with call bell/phone within reach;with bed alarm set;Other (  comment) Nurse Communication:  (Notified of soiled bed sheets) PT Visit Diagnosis: Unsteadiness on feet (R26.81);Other abnormalities of gait and mobility (R26.89);Muscle weakness (generalized) (M62.81)    Time: 3833-3832 PT Time Calculation (min) (ACUTE ONLY): 40 min   Charges:   PT Evaluation $PT Re-evaluation: 1 Re-eval PT  Treatments $Therapeutic Exercise: 8-22 mins $Therapeutic Activity: 8-22 mins        Iva Boop, PT  09/27/21. 1:06 PM

## 2021-09-27 NOTE — Assessment & Plan Note (Signed)
Now resolved.  

## 2021-09-27 NOTE — Assessment & Plan Note (Signed)
Chronic indwelling Foley.  Kidney function back to baseline with hydration patient is status post right nephrectomy

## 2021-09-27 NOTE — Assessment & Plan Note (Signed)
Resolved with bowel regimen.

## 2021-09-27 NOTE — Assessment & Plan Note (Signed)
Continue hydroxychloroquine.  Follow-up with Dr. Jefm Bryant as an outpatient.

## 2021-09-27 NOTE — Assessment & Plan Note (Signed)
Continue Neurontin. 

## 2021-09-27 NOTE — Progress Notes (Signed)
Progress Note    [name]   IDP:[id number]  DOB: 05-24-1934  DOA: 09/11/2021     16 Date of Service: 09/27/2021   Clinical Course  The patient is an 87 year old Caucasian male with a past medical history significant for 2 right renal nephrectomy given renal mass, chronic hyponatremia, chronic indwelling Foley catheter, chronic diastolic CHF, essential hypertension, persistent atrial fibrillation as well as other comorbidities who presented with hypotension initially.  Initial urinalysis showed blood but nitrates and leuk esterase was negative.  Initially antibiotics were held off but he spiked a temperature of 103 and blood cultures were drawn and E. coli was shown growing out of 1 out of 4 bottles.  His Foley catheter was changed out by urology and he is placed on IV ceftriaxone.  Nephrology been following given his AKI on admission and his IV fluids have now been stopped.  The E. coli bacteremia sensitivities came back and he is sensitive to cefazolin, Zosyn, imipenem, ciprofloxacin, and ceftriaxone which we will continue while hospitalized and change to po Levofloxacin at D/C.  We will obtain PT OT for further evaluate and treat and they are recommending SNF. His Renal Fxn is improved and he was stable to D/C to SNF however prior to D/C to SNF he tested Positive for COVID 19. His Inflammatory markers are elevated but He is asymptomatic.  10/26-10/31 [name] placement on 11/1   Assessment and Plan * E coli bacteremia In the setting of indwelling Foley and UTI.  Foley catheter was changed on admission.  Urine culture showed multiple organisms.  Completed antibiotics.  COVID-19 virus infection Incidental finding.  Patient is asymptomatic.  He needs to be isolated for 10 days per SNF policy before they would take him which would be on November 1.  He will go to [facility].  He is requesting private room which has been set per TOC.  Patient requesting influenza vaccination while  here which I have ordered.  Acute kidney injury superimposed on CKD (HCC) Chronic indwelling Foley.  Kidney function back to baseline with hydration patient is status post right nephrectomy  Chronic pain syndrome Continue Neurontin  Rheumatoid arthritis involving multiple sites with positive rheumatoid factor (HCC) Continue hydroxychloroquine.  Follow-up with Dr. Jefm Bryant as an outpatient.  Pancytopenia (HCC) Stable counts.  AF (paroxysmal atrial fibrillation) (HCC) Controlled rate on metoprolol, Eliquis for anticoagulation.  Chronic constipation Resolved with bowel regimen.   Fever Now resolved  Hyperlipidemia Continue statin     Subjective:  No new issues.  Looking forward to get discharged tomorrow  Objective Vitals:   09/26/21 0832 09/26/21 1748 09/26/21 2124 09/27/21 0805  BP: (!) 138/55 (!) 113/49 (!) 136/51 (!) 118/41  Pulse: 69 66 72 72  Resp: 16 17 18 18   Temp: (!) 97.4 F (36.3 C) 97.6 F (36.4 C) 97.8 F (36.6 C) 97.7 F (36.5 C)  TempSrc:      SpO2: 91% 95% 94% 97%  Weight:      Height:       103 kg  Vital signs were reviewed and unremarkable except for: Blood pressure: High    Exam Physical Exam   General: Pt is alert, awake, not in acute distress Cardiovascular: RRR, S1/S2 +, no rubs, no gallops Respiratory: Diminished bilaterally, no wheezing, no rhonchi; Unlabored breathing and not wearing supplemental O2 via [place] Abdominal: Soft, NT, Distended 2/2 body habiuts, bowel sounds + Extremities: 1+ LE edema, no cyanosis  Labs / Other Information My review of  labs, imaging, notes and other tests shows no new significant findings.    Disposition Plan: Status is: Inpatient  Remains inpatient appropriate because: SNF discharge tomorrow     Time spent: 15 minutes [facility] 09/27/2021, 12:02 PM

## 2021-09-27 NOTE — Assessment & Plan Note (Signed)
In the setting of indwelling Foley and UTI.  Foley catheter was changed on admission.  Urine culture showed multiple organisms.  Completed antibiotics.

## 2021-09-27 NOTE — Evaluation (Addendum)
Occupational Therapy Re-Evaluation Patient Details Name: [name] MRN: [id number] DOB: [birth year] Today's Date: 09/27/2021   History of Present Illness [name]  is a 87 y.o. male with a known history of chronic diastolic congestive heart failure, indwelling Foley catheter, rheumatoid arthritis, chronic hyponatremia, chronic kidney disease. He has been exhausted lately and having trouble sleeping, and has been weak and struggling to get up and down. In the ER he was hypotensive with a blood pressure of 94/37 on 1 occasion.  He was dehydrated with a BUN of 36 and a creatinine up to 2.28. Found to be COVID + 09/17/21.   Clinical Impression   [name] was seen for OT re-evaluation in the setting of prolonged hospitalization on this date. Upon arrival to room pt reclined in bed, agreeable to session. Pt requires MAX A don B socks seated EOB. Completes sit<>stand x5 from elevated bed height using sink for BUE support and B knee block, pt toelrates ~15-30seconds standing each trial, achieves weight shifting for 1 trial. MOD A for ADL t/f scooting along bed. SETUP self-drinking at bed level. Pt making good progress toward goals. Pt continues to benefit from skilled OT services to maximize return to PLOF and minimize risk of future falls, injury, caregiver burden, and readmission. Will continue to follow POC. Discharge recommendation remains appropriate.       Recommendations for follow up therapy are one component of a multi-disciplinary discharge planning process, led by the attending physician.  Recommendations may be updated based on patient status, additional functional criteria and insurance authorization.   Follow Up Recommendations  Skilled nursing-short term rehab (<3 hours/day)    Assistance Recommended at Discharge Frequent or constant Supervision/Assistance  Functional Status Assessment     Equipment Recommendations  Other (comment) (defer to next venue of care)    Recommendations  for Other Services       Precautions / Restrictions Precautions Precautions: Fall Restrictions Weight Bearing Restrictions: No      Mobility Bed Mobility Overal bed mobility: Needs Assistance Bed Mobility: Supine to Sit;Sit to Supine     Supine to sit: Max assist Sit to supine: Max assist        Transfers Overall transfer level: Needs assistance   Transfers: Sit to/from Stand;Lateral/Scoot Transfers Sit to Stand: Min assist;+2 physical assistance;From elevated surface          Lateral/Scoot Transfers: Mod assist General transfer comment: STS x5 from elevated bed height using sink for BUE support and B knee block, pt toelrates ~15-30seconds standing each trial, achieves weight shifting for 1 trial.       Balance Overall balance assessment: Needs assistance Sitting-balance support: No upper extremity supported;Feet supported Sitting balance-[name]: Good     Standing balance support: Bilateral upper extremity supported Standing balance-[name]: Poor Standing balance comment: assist to maintain standing posture with cueing for positioning                           ADL either performed or assessed with clinical judgement   ADL Overall ADL's : Needs assistance/impaired                                       General ADL Comments: MAX A don B socks seated EOB. MOD A for ADL t/f scooting along bed. SETUP self-drinking at bed level      Pertinent  Vitals/Pain Pain Assessment: Faces Faces Pain Scale: Hurts little more Pain Location: back Pain Descriptors / Indicators: Aching Pain Intervention(s): Repositioned     Hand Dominance     Extremity/Trunk Assessment Upper Extremity Assessment Upper Extremity Assessment: LUE deficits/detail;RUE deficits/detail RUE Deficits / Details: Shoulder flexion AROM 0-30 (aprox) degrees; Elbow flexion/extension 3/5; Grip strength 4/5 LUE Deficits / Details: Shoulder flexion AROM 0-30 (aprox)  degrees; Elbow flexion/extension 3/5   Lower Extremity Assessment Lower Extremity Assessment: Generalized weakness       Communication     Cognition Arousal/Alertness: Awake/alert Behavior During Therapy: WFL for tasks assessed/performed Overall Cognitive Status: Within Functional Limits for tasks assessed                                 General Comments: Verbose and tangential conversation.     General Comments       Exercises Exercises: Other exercises;General Lower Extremity General Exercises - Lower Extremity Hip Flexion/Marching: AROM;Strengthening;Both;20 reps;Seated Other Exercises Other Exercises: Pt educated re: OT role, DME recs, d/c recs, falls prevention, wound prevention Other Exercises: LBD, sup<>sit, sit<>stand, sitting/standing balance/tolerance, STS x5, lateral scoot     OT Goals(Current goals can be found in the care plan section) Acute Rehab OT Goals OT Goal Formulation: With patient Time For Goal Achievement: 10/11/21 Potential to Achieve Goals: Fair ADL Goals Pt Will Perform Upper Body Bathing: with set-up;with supervision;sitting Pt Will Perform Lower Body Bathing: with mod assist;sit to/from stand Pt Will Transfer to Toilet: with mod assist;stand pivot transfer;bedside commode  OT Frequency: Min 1X/week    AM-PAC OT "6 Clicks" Daily Activity     Outcome Measure Help from another person eating meals?: A Little Help from another person taking care of personal grooming?: A Lot Help from another person toileting, which includes using toliet, bedpan, or urinal?: A Lot Help from another person bathing (including washing, rinsing, drying)?: A Lot Help from another person to put on and taking off regular upper body clothing?: A Lot Help from another person to put on and taking off regular lower body clothing?: A Lot 6 Click Score: 13   End of Session Equipment Utilized During Treatment: Gait belt Nurse Communication: Mobility  status  Activity Tolerance: Patient tolerated treatment well Patient left: in bed;with call bell/phone within reach;with bed alarm set  OT Visit Diagnosis: Muscle weakness (generalized) (M62.81);Unsteadiness on feet (R26.81)                Time: 7416-3845 OT Time Calculation (min): 45 min Charges:  OT General Charges $OT Visit: 1 Visit OT Treatments $Self Care/Home Management : 8-22 mins $Therapeutic Exercise: 23-37 mins  Dessie Coma, M.S. OTR/L  09/27/21, 12:01 PM  ascom [phone]

## 2021-09-27 NOTE — Assessment & Plan Note (Signed)
Incidental finding.  Patient is asymptomatic.  He needs to be isolated for 10 days per SNF policy before they would take him which would be on November 1.  He will go to [facility].  He is requesting private room which has been set per TOC.  Patient requesting influenza vaccination while here which I have ordered.

## 2021-09-27 NOTE — Assessment & Plan Note (Signed)
Stable counts.

## 2021-09-28 ENCOUNTER — Ambulatory Visit: Payer: Self-pay

## 2021-09-28 DIAGNOSIS — I10 Essential (primary) hypertension: Secondary | ICD-10-CM | POA: Diagnosis not present

## 2021-09-28 DIAGNOSIS — E871 Hypo-osmolality and hyponatremia: Secondary | ICD-10-CM | POA: Diagnosis not present

## 2021-09-28 DIAGNOSIS — G894 Chronic pain syndrome: Secondary | ICD-10-CM | POA: Diagnosis present

## 2021-09-28 DIAGNOSIS — I130 Hypertensive heart and chronic kidney disease with heart failure and stage 1 through stage 4 chronic kidney disease, or unspecified chronic kidney disease: Secondary | ICD-10-CM | POA: Diagnosis present

## 2021-09-28 DIAGNOSIS — L89152 Pressure ulcer of sacral region, stage 2: Secondary | ICD-10-CM | POA: Diagnosis present

## 2021-09-28 DIAGNOSIS — I5032 Chronic diastolic (congestive) heart failure: Secondary | ICD-10-CM | POA: Diagnosis present

## 2021-09-28 DIAGNOSIS — R5381 Other malaise: Secondary | ICD-10-CM | POA: Diagnosis not present

## 2021-09-28 DIAGNOSIS — R2689 Other abnormalities of gait and mobility: Secondary | ICD-10-CM | POA: Diagnosis not present

## 2021-09-28 DIAGNOSIS — R2681 Unsteadiness on feet: Secondary | ICD-10-CM | POA: Diagnosis not present

## 2021-09-28 DIAGNOSIS — Z7401 Bed confinement status: Secondary | ICD-10-CM | POA: Diagnosis not present

## 2021-09-28 DIAGNOSIS — R278 Other lack of coordination: Secondary | ICD-10-CM | POA: Diagnosis not present

## 2021-09-28 DIAGNOSIS — F411 Generalized anxiety disorder: Secondary | ICD-10-CM | POA: Diagnosis not present

## 2021-09-28 DIAGNOSIS — N390 Urinary tract infection, site not specified: Secondary | ICD-10-CM | POA: Diagnosis not present

## 2021-09-28 DIAGNOSIS — I517 Cardiomegaly: Secondary | ICD-10-CM | POA: Diagnosis not present

## 2021-09-28 DIAGNOSIS — Z6841 Body Mass Index (BMI) 40.0 and over, adult: Secondary | ICD-10-CM | POA: Diagnosis not present

## 2021-09-28 DIAGNOSIS — R509 Fever, unspecified: Secondary | ICD-10-CM | POA: Diagnosis not present

## 2021-09-28 DIAGNOSIS — J4541 Moderate persistent asthma with (acute) exacerbation: Secondary | ICD-10-CM | POA: Diagnosis not present

## 2021-09-28 DIAGNOSIS — Z905 Acquired absence of kidney: Secondary | ICD-10-CM | POA: Diagnosis not present

## 2021-09-28 DIAGNOSIS — M6281 Muscle weakness (generalized): Secondary | ICD-10-CM | POA: Diagnosis not present

## 2021-09-28 DIAGNOSIS — R0902 Hypoxemia: Secondary | ICD-10-CM | POA: Diagnosis not present

## 2021-09-28 DIAGNOSIS — Z7901 Long term (current) use of anticoagulants: Secondary | ICD-10-CM | POA: Diagnosis not present

## 2021-09-28 DIAGNOSIS — N178 Other acute kidney failure: Secondary | ICD-10-CM | POA: Diagnosis not present

## 2021-09-28 DIAGNOSIS — U071 COVID-19: Secondary | ICD-10-CM | POA: Diagnosis not present

## 2021-09-28 DIAGNOSIS — N319 Neuromuscular dysfunction of bladder, unspecified: Secondary | ICD-10-CM | POA: Diagnosis present

## 2021-09-28 DIAGNOSIS — J45901 Unspecified asthma with (acute) exacerbation: Secondary | ICD-10-CM | POA: Diagnosis present

## 2021-09-28 DIAGNOSIS — Z23 Encounter for immunization: Secondary | ICD-10-CM | POA: Diagnosis present

## 2021-09-28 DIAGNOSIS — I4819 Other persistent atrial fibrillation: Secondary | ICD-10-CM | POA: Diagnosis present

## 2021-09-28 DIAGNOSIS — N179 Acute kidney failure, unspecified: Secondary | ICD-10-CM | POA: Diagnosis not present

## 2021-09-28 DIAGNOSIS — J9601 Acute respiratory failure with hypoxia: Secondary | ICD-10-CM | POA: Diagnosis present

## 2021-09-28 DIAGNOSIS — K219 Gastro-esophageal reflux disease without esophagitis: Secondary | ICD-10-CM | POA: Diagnosis present

## 2021-09-28 DIAGNOSIS — Z79899 Other long term (current) drug therapy: Secondary | ICD-10-CM | POA: Diagnosis not present

## 2021-09-28 DIAGNOSIS — Z96652 Presence of left artificial knee joint: Secondary | ICD-10-CM | POA: Diagnosis present

## 2021-09-28 DIAGNOSIS — I959 Hypotension, unspecified: Secondary | ICD-10-CM | POA: Diagnosis not present

## 2021-09-28 DIAGNOSIS — I480 Paroxysmal atrial fibrillation: Secondary | ICD-10-CM | POA: Diagnosis present

## 2021-09-28 DIAGNOSIS — Y846 Urinary catheterization as the cause of abnormal reaction of the patient, or of later complication, without mention of misadventure at the time of the procedure: Secondary | ICD-10-CM | POA: Diagnosis not present

## 2021-09-28 DIAGNOSIS — R531 Weakness: Secondary | ICD-10-CM | POA: Diagnosis not present

## 2021-09-28 DIAGNOSIS — M1990 Unspecified osteoarthritis, unspecified site: Secondary | ICD-10-CM | POA: Diagnosis not present

## 2021-09-28 DIAGNOSIS — A419 Sepsis, unspecified organism: Secondary | ICD-10-CM | POA: Diagnosis not present

## 2021-09-28 DIAGNOSIS — I700 Atherosclerosis of aorta: Secondary | ICD-10-CM | POA: Diagnosis not present

## 2021-09-28 DIAGNOSIS — G4733 Obstructive sleep apnea (adult) (pediatric): Secondary | ICD-10-CM | POA: Diagnosis present

## 2021-09-28 DIAGNOSIS — I2510 Atherosclerotic heart disease of native coronary artery without angina pectoris: Secondary | ICD-10-CM | POA: Diagnosis present

## 2021-09-28 DIAGNOSIS — D61818 Other pancytopenia: Secondary | ICD-10-CM | POA: Diagnosis present

## 2021-09-28 DIAGNOSIS — N182 Chronic kidney disease, stage 2 (mild): Secondary | ICD-10-CM | POA: Diagnosis present

## 2021-09-28 DIAGNOSIS — Z8673 Personal history of transient ischemic attack (TIA), and cerebral infarction without residual deficits: Secondary | ICD-10-CM | POA: Diagnosis not present

## 2021-09-28 DIAGNOSIS — M069 Rheumatoid arthritis, unspecified: Secondary | ICD-10-CM | POA: Diagnosis present

## 2021-09-28 DIAGNOSIS — R0602 Shortness of breath: Secondary | ICD-10-CM | POA: Diagnosis present

## 2021-09-28 DIAGNOSIS — R339 Retention of urine, unspecified: Secondary | ICD-10-CM | POA: Diagnosis not present

## 2021-09-28 DIAGNOSIS — E785 Hyperlipidemia, unspecified: Secondary | ICD-10-CM | POA: Diagnosis present

## 2021-09-28 DIAGNOSIS — E669 Obesity, unspecified: Secondary | ICD-10-CM | POA: Diagnosis present

## 2021-09-28 DIAGNOSIS — Z8616 Personal history of COVID-19: Secondary | ICD-10-CM | POA: Diagnosis not present

## 2021-09-28 DIAGNOSIS — K588 Other irritable bowel syndrome: Secondary | ICD-10-CM | POA: Diagnosis not present

## 2021-09-28 LAB — D-DIMER, QUANTITATIVE: D-Dimer, Quant: 0.46 ug/mL-FEU (ref 0.00–0.50)

## 2021-09-28 LAB — C-REACTIVE PROTEIN: CRP: 2.3 mg/dL — ABNORMAL HIGH (ref <1.0)

## 2021-09-28 MED ORDER — ALPRAZOLAM 0.5 MG PO TABS: 0.5000 mg | ORAL_TABLET | Freq: Every day | ORAL | 0 refills | Status: DC

## 2021-09-28 MED ORDER — APIXABAN 5 MG PO TABS: 5.0000 mg | ORAL_TABLET | Freq: Two times a day (BID) | ORAL | Status: DC

## 2021-09-28 NOTE — Progress Notes (Signed)
[facility] facility contacted 1A nursing station. Discharge report/instructions given to facility nurse, Debarah Crape, LPN.

## 2021-09-28 NOTE — TOC Progression Note (Addendum)
Transition of Care [facility]) - Progression Note    Patient Details  Name: [name] MRN: [id number] Date of Birth: [birth year]  Transition of Care [facility]) CM/SW [place], RN Phone Number: 09/28/2021, 1:49 PM  Clinical Narrative:   Sent DC Summary thru the Hub, Unable to reach the admissions director, called main number and asked to speak to the administrator, Was placed in her VM, Left a secure VM for the administrator requesting a call back ASAP to get room number to set up transport to the facility  Update, have left 3 voice mails with the admissions department without a call back, Called a 2nd time and left a voice mail with administration requesting a call back with the room number and number to call report,  Notified the patient's daughter that we are awaiting a room number to be able to send the patient to [facility] place  Expected Discharge Plan: Skilled Nursing Facility Barriers to Discharge: Continued Medical Work up  Expected Discharge Plan and Services Expected Discharge Plan: [place] arrangements for the past 2 months: Single Family Home Expected Discharge Date: 09/28/21                                     Social Determinants of Health (SDOH) Interventions    Readmission Risk Interventions Readmission Risk Prevention Plan 09/12/2021 06/23/2019  Transportation Screening Complete Complete  PCP or Specialist Appt within 3-5 Days - Complete  HRI or [place] - Complete  Social Work Consult for [place] Planning/Counseling - Complete  Palliative Care Screening - Not Applicable  Medication Review Press photographer) Complete Complete  PCP or Specialist appointment within 3-5 days of discharge Complete -  [place] or Home Care Consult Complete -  SW Recovery Care/Counseling Consult Complete -  Palliative Care Screening Not Applicable -  Skilled Nursing Facility Complete -  Some recent data might be hidden

## 2021-09-28 NOTE — TOC Progression Note (Signed)
Transition of Care [facility]) - Progression Note    Patient Details  Name: [name] MRN: [id number] Date of Birth: [birth year]  Transition of Care [facility]) CM/SW [place], RN Phone Number: 09/28/2021, 4:12 PM  Clinical Narrative:   [name] called and let me know that the patient's room number is 24, Bedside nurse to call [phone] for report, ems will transport    Expected Discharge Plan: [place] Barriers to Discharge: Continued Medical Work up  Expected Discharge Plan and Services Expected Discharge Plan: [place] arrangements for the past 2 months: Single Family Home Expected Discharge Date: 09/28/21                                     Social Determinants of Health (SDOH) Interventions    Readmission Risk Interventions Readmission Risk Prevention Plan 09/12/2021 06/23/2019  Transportation Screening Complete Complete  PCP or Specialist Appt within 3-5 Days - Complete  HRI or [place] - Complete  Social Work Consult for [place] Planning/Counseling - Complete  Palliative Care Screening - Not Applicable  Medication Review Press photographer) Complete Complete  PCP or Specialist appointment within 3-5 days of discharge Complete -  [place] or Home Care Consult Complete -  SW Recovery Care/Counseling Consult Complete -  Palliative Care Screening Not Applicable -  Skilled Nursing Facility Complete -  Some recent data might be hidden

## 2021-09-28 NOTE — Plan of Care (Signed)

## 2021-09-28 NOTE — Care Management Important Message (Signed)
Important Message  Patient Details  Name: [name] MRN: [id number] Date of Birth: 1934-11-07   Medicare Important Message Given:  Yes  Patient is in an isolation room so I reviewed the Important Message from Medicare with him over the phone [phone]). He is in agreement with this his discharge plan today. I asked if he would like me to send him a copy of the form and he said it wasn't necessary.  I wished him a speedy recovery and thanked him for his time.   Juliann Pulse A Lynell Greenhouse 09/28/2021, 10:25 AM

## 2021-09-28 NOTE — TOC Progression Note (Signed)
Transition of Care [facility]) - Progression Note    Patient Details  Name: [name] MRN: [id number] Date of Birth: 07/27/1934  Transition of Care [facility]) CM/SW [place], RN Phone Number: 09/28/2021, 9:19 AM  Clinical Narrative:   Sent the DC summary to [facility] thru the Hub and alerted [facility] that it was sent, the patient will need EMS to transport, his daughter would like to be called when EMS is scheduled, Awaiting a room number and the number to call report from [name]    Expected Discharge Plan: [place] Barriers to Discharge: Continued Medical Work up  Expected Discharge Plan and Services Expected Discharge Plan: [place] arrangements for the past 2 months: Single Family Home Expected Discharge Date: 09/28/21                                     Social Determinants of Health (SDOH) Interventions    Readmission Risk Interventions Readmission Risk Prevention Plan 09/12/2021 06/23/2019  Transportation Screening Complete Complete  PCP or Specialist Appt within 3-5 Days - Complete  HRI or [place] - Complete  Social Work Consult for [place] Planning/Counseling - Complete  Palliative Care Screening - Not Applicable  Medication Review Press photographer) Complete Complete  PCP or Specialist appointment within 3-5 days of discharge Complete -  [place] or Home Care Consult Complete -  SW Recovery Care/Counseling Consult Complete -  Palliative Care Screening Not Applicable -  Skilled Nursing Facility Complete -  Some recent data might be hidden

## 2021-09-28 NOTE — Progress Notes (Signed)
Attempted to contact  [facility] for the 4th time. Attempted call not successful.

## 2021-09-28 NOTE — Progress Notes (Signed)
[place] 3 times.  The first call was answered a facility staff and the call was being transferred. In the midst of being transferred, call was disconnected.  Unable to reach facility via phone on 2nd and 3rd time due to phone disconnection after 2 rings.

## 2021-09-29 DIAGNOSIS — D61818 Other pancytopenia: Secondary | ICD-10-CM | POA: Diagnosis not present

## 2021-09-29 DIAGNOSIS — E785 Hyperlipidemia, unspecified: Secondary | ICD-10-CM | POA: Diagnosis not present

## 2021-09-29 DIAGNOSIS — N390 Urinary tract infection, site not specified: Secondary | ICD-10-CM | POA: Diagnosis not present

## 2021-09-29 DIAGNOSIS — E871 Hypo-osmolality and hyponatremia: Secondary | ICD-10-CM | POA: Diagnosis not present

## 2021-09-29 DIAGNOSIS — G894 Chronic pain syndrome: Secondary | ICD-10-CM | POA: Diagnosis not present

## 2021-09-29 DIAGNOSIS — M069 Rheumatoid arthritis, unspecified: Secondary | ICD-10-CM | POA: Diagnosis not present

## 2021-09-29 DIAGNOSIS — I2510 Atherosclerotic heart disease of native coronary artery without angina pectoris: Secondary | ICD-10-CM | POA: Diagnosis not present

## 2021-09-29 DIAGNOSIS — I480 Paroxysmal atrial fibrillation: Secondary | ICD-10-CM | POA: Diagnosis not present

## 2021-09-29 DIAGNOSIS — F411 Generalized anxiety disorder: Secondary | ICD-10-CM | POA: Diagnosis not present

## 2021-09-30 DIAGNOSIS — N178 Other acute kidney failure: Secondary | ICD-10-CM | POA: Diagnosis not present

## 2021-09-30 DIAGNOSIS — G894 Chronic pain syndrome: Secondary | ICD-10-CM | POA: Diagnosis not present

## 2021-09-30 DIAGNOSIS — N390 Urinary tract infection, site not specified: Secondary | ICD-10-CM | POA: Diagnosis not present

## 2021-09-30 DIAGNOSIS — M6281 Muscle weakness (generalized): Secondary | ICD-10-CM | POA: Diagnosis not present

## 2021-09-30 DIAGNOSIS — I480 Paroxysmal atrial fibrillation: Secondary | ICD-10-CM | POA: Diagnosis not present

## 2021-09-30 DIAGNOSIS — K588 Other irritable bowel syndrome: Secondary | ICD-10-CM | POA: Diagnosis not present

## 2021-09-30 DIAGNOSIS — I2510 Atherosclerotic heart disease of native coronary artery without angina pectoris: Secondary | ICD-10-CM | POA: Diagnosis not present

## 2021-09-30 DIAGNOSIS — I5032 Chronic diastolic (congestive) heart failure: Secondary | ICD-10-CM | POA: Diagnosis not present

## 2021-09-30 DIAGNOSIS — M069 Rheumatoid arthritis, unspecified: Secondary | ICD-10-CM | POA: Diagnosis not present

## 2021-10-04 ENCOUNTER — Ambulatory Visit: Payer: Medicare PPO

## 2021-10-05 ENCOUNTER — Encounter: Payer: Self-pay | Admitting: Emergency Medicine

## 2021-10-05 ENCOUNTER — Other Ambulatory Visit: Payer: Self-pay

## 2021-10-05 ENCOUNTER — Emergency Department: Payer: Medicare PPO

## 2021-10-05 ENCOUNTER — Inpatient Hospital Stay
Admission: EM | Admit: 2021-10-05 | Discharge: 2021-10-08 | DRG: 202 | Disposition: A | Discharge status: Skilled Nursing Facility | Payer: Medicare PPO | Source: Skilled Nursing Facility | Attending: Student | Admitting: Student

## 2021-10-05 DIAGNOSIS — L89152 Pressure ulcer of sacral region, stage 2: Secondary | ICD-10-CM | POA: Diagnosis present

## 2021-10-05 DIAGNOSIS — E46 Unspecified protein-calorie malnutrition: Secondary | ICD-10-CM | POA: Diagnosis not present

## 2021-10-05 DIAGNOSIS — R339 Retention of urine, unspecified: Secondary | ICD-10-CM | POA: Diagnosis not present

## 2021-10-05 DIAGNOSIS — E669 Obesity, unspecified: Secondary | ICD-10-CM | POA: Diagnosis present

## 2021-10-05 DIAGNOSIS — Z882 Allergy status to sulfonamides status: Secondary | ICD-10-CM

## 2021-10-05 DIAGNOSIS — M6281 Muscle weakness (generalized): Secondary | ICD-10-CM | POA: Diagnosis not present

## 2021-10-05 DIAGNOSIS — I480 Paroxysmal atrial fibrillation: Secondary | ICD-10-CM | POA: Diagnosis present

## 2021-10-05 DIAGNOSIS — U071 COVID-19: Secondary | ICD-10-CM | POA: Diagnosis not present

## 2021-10-05 DIAGNOSIS — K219 Gastro-esophageal reflux disease without esophagitis: Secondary | ICD-10-CM | POA: Diagnosis present

## 2021-10-05 DIAGNOSIS — D61818 Other pancytopenia: Secondary | ICD-10-CM | POA: Diagnosis present

## 2021-10-05 DIAGNOSIS — Z960 Presence of urogenital implants: Secondary | ICD-10-CM

## 2021-10-05 DIAGNOSIS — J4550 Severe persistent asthma, uncomplicated: Secondary | ICD-10-CM | POA: Diagnosis not present

## 2021-10-05 DIAGNOSIS — Z8249 Family history of ischemic heart disease and other diseases of the circulatory system: Secondary | ICD-10-CM

## 2021-10-05 DIAGNOSIS — M0579 Rheumatoid arthritis with rheumatoid factor of multiple sites without organ or systems involvement: Secondary | ICD-10-CM | POA: Diagnosis not present

## 2021-10-05 DIAGNOSIS — I4819 Other persistent atrial fibrillation: Secondary | ICD-10-CM | POA: Diagnosis not present

## 2021-10-05 DIAGNOSIS — I5032 Chronic diastolic (congestive) heart failure: Secondary | ICD-10-CM | POA: Diagnosis present

## 2021-10-05 DIAGNOSIS — Z96652 Presence of left artificial knee joint: Secondary | ICD-10-CM | POA: Diagnosis present

## 2021-10-05 DIAGNOSIS — G4733 Obstructive sleep apnea (adult) (pediatric): Secondary | ICD-10-CM | POA: Diagnosis present

## 2021-10-05 DIAGNOSIS — B999 Unspecified infectious disease: Secondary | ICD-10-CM | POA: Diagnosis not present

## 2021-10-05 DIAGNOSIS — N182 Chronic kidney disease, stage 2 (mild): Secondary | ICD-10-CM | POA: Diagnosis present

## 2021-10-05 DIAGNOSIS — I517 Cardiomegaly: Secondary | ICD-10-CM | POA: Diagnosis not present

## 2021-10-05 DIAGNOSIS — Z8673 Personal history of transient ischemic attack (TIA), and cerebral infarction without residual deficits: Secondary | ICD-10-CM | POA: Diagnosis not present

## 2021-10-05 DIAGNOSIS — N319 Neuromuscular dysfunction of bladder, unspecified: Secondary | ICD-10-CM | POA: Diagnosis present

## 2021-10-05 DIAGNOSIS — G894 Chronic pain syndrome: Secondary | ICD-10-CM | POA: Diagnosis present

## 2021-10-05 DIAGNOSIS — Z905 Acquired absence of kidney: Secondary | ICD-10-CM | POA: Diagnosis not present

## 2021-10-05 DIAGNOSIS — E785 Hyperlipidemia, unspecified: Secondary | ICD-10-CM | POA: Diagnosis present

## 2021-10-05 DIAGNOSIS — M069 Rheumatoid arthritis, unspecified: Secondary | ICD-10-CM | POA: Diagnosis present

## 2021-10-05 DIAGNOSIS — J45901 Unspecified asthma with (acute) exacerbation: Secondary | ICD-10-CM | POA: Diagnosis not present

## 2021-10-05 DIAGNOSIS — R0602 Shortness of breath: Secondary | ICD-10-CM

## 2021-10-05 DIAGNOSIS — Z6841 Body Mass Index (BMI) 40.0 and over, adult: Secondary | ICD-10-CM

## 2021-10-05 DIAGNOSIS — Z79899 Other long term (current) drug therapy: Secondary | ICD-10-CM

## 2021-10-05 DIAGNOSIS — J9601 Acute respiratory failure with hypoxia: Secondary | ICD-10-CM | POA: Diagnosis present

## 2021-10-05 DIAGNOSIS — I5033 Acute on chronic diastolic (congestive) heart failure: Secondary | ICD-10-CM | POA: Diagnosis present

## 2021-10-05 DIAGNOSIS — Z743 Need for continuous supervision: Secondary | ICD-10-CM | POA: Diagnosis not present

## 2021-10-05 DIAGNOSIS — Z808 Family history of malignant neoplasm of other organs or systems: Secondary | ICD-10-CM

## 2021-10-05 DIAGNOSIS — Z7901 Long term (current) use of anticoagulants: Secondary | ICD-10-CM | POA: Diagnosis not present

## 2021-10-05 DIAGNOSIS — I130 Hypertensive heart and chronic kidney disease with heart failure and stage 1 through stage 4 chronic kidney disease, or unspecified chronic kidney disease: Secondary | ICD-10-CM | POA: Diagnosis present

## 2021-10-05 DIAGNOSIS — I10 Essential (primary) hypertension: Secondary | ICD-10-CM | POA: Diagnosis not present

## 2021-10-05 DIAGNOSIS — I700 Atherosclerosis of aorta: Secondary | ICD-10-CM | POA: Diagnosis not present

## 2021-10-05 DIAGNOSIS — Z881 Allergy status to other antibiotic agents status: Secondary | ICD-10-CM

## 2021-10-05 DIAGNOSIS — R0902 Hypoxemia: Secondary | ICD-10-CM | POA: Diagnosis not present

## 2021-10-05 DIAGNOSIS — I959 Hypotension, unspecified: Secondary | ICD-10-CM | POA: Diagnosis not present

## 2021-10-05 DIAGNOSIS — J4541 Moderate persistent asthma with (acute) exacerbation: Secondary | ICD-10-CM

## 2021-10-05 DIAGNOSIS — Z888 Allergy status to other drugs, medicaments and biological substances status: Secondary | ICD-10-CM

## 2021-10-05 DIAGNOSIS — R5381 Other malaise: Secondary | ICD-10-CM | POA: Diagnosis not present

## 2021-10-05 DIAGNOSIS — I2510 Atherosclerotic heart disease of native coronary artery without angina pectoris: Secondary | ICD-10-CM | POA: Diagnosis present

## 2021-10-05 DIAGNOSIS — Z8616 Personal history of COVID-19: Secondary | ICD-10-CM | POA: Diagnosis not present

## 2021-10-05 DIAGNOSIS — R509 Fever, unspecified: Secondary | ICD-10-CM | POA: Diagnosis not present

## 2021-10-05 DIAGNOSIS — Z8546 Personal history of malignant neoplasm of prostate: Secondary | ICD-10-CM

## 2021-10-05 LAB — COMPREHENSIVE METABOLIC PANEL
ALT: 12 U/L (ref 0–44)
AST: 19 U/L (ref 15–41)
Albumin: 3 g/dL — ABNORMAL LOW (ref 3.5–5.0)
Alkaline Phosphatase: 49 U/L (ref 38–126)
Anion gap: 6 (ref 5–15)
BUN: 18 mg/dL (ref 8–23)
CO2: 30 mmol/L (ref 22–32)
Calcium: 8.2 mg/dL — ABNORMAL LOW (ref 8.9–10.3)
Chloride: 98 mmol/L (ref 98–111)
Creatinine, Ser: 0.89 mg/dL (ref 0.61–1.24)
GFR, Estimated: >60 mL/min (ref >60)
Glucose, Bld: 96 mg/dL (ref 70–99)
Potassium: 5 mmol/L (ref 3.5–5.1)
Sodium: 134 mmol/L — ABNORMAL LOW (ref 135–145)
Total Bilirubin: 0.8 mg/dL (ref 0.3–1.2)
Total Protein: 5.8 g/dL — ABNORMAL LOW (ref 6.5–8.1)

## 2021-10-05 LAB — CBC WITH DIFFERENTIAL/PLATELET
Abs Immature Granulocytes: 0.01 10*3/uL (ref 0.00–0.07)
Basophils Absolute: 0.1 10*3/uL (ref 0.0–0.1)
Basophils Relative: 1 %
Eosinophils Absolute: 0.6 10*3/uL — ABNORMAL HIGH (ref 0.0–0.5)
Eosinophils Relative: 18 %
HCT: 31.1 % — ABNORMAL LOW (ref 39.0–52.0)
Hemoglobin: 10.3 g/dL — ABNORMAL LOW (ref 13.0–17.0)
Immature Granulocytes: 0 %
Lymphocytes Relative: 22 %
Lymphs Abs: 0.8 10*3/uL (ref 0.7–4.0)
MCH: 33.6 pg (ref 26.0–34.0)
MCHC: 33.1 g/dL (ref 30.0–36.0)
MCV: 101.3 fL — ABNORMAL HIGH (ref 80.0–100.0)
Monocytes Absolute: 0.4 10*3/uL (ref 0.1–1.0)
Monocytes Relative: 11 %
Neutro Abs: 1.7 10*3/uL (ref 1.7–7.7)
Neutrophils Relative %: 48 %
Platelets: 91 10*3/uL — ABNORMAL LOW (ref 150–400)
RBC: 3.07 MIL/uL — ABNORMAL LOW (ref 4.22–5.81)
RDW: 14.6 % (ref 11.5–15.5)
WBC: 3.5 10*3/uL — ABNORMAL LOW (ref 4.0–10.5)
nRBC: 0 % (ref 0.0–0.2)

## 2021-10-05 LAB — BRAIN NATRIURETIC PEPTIDE: B Natriuretic Peptide: 216 pg/mL — ABNORMAL HIGH (ref 0.0–100.0)

## 2021-10-05 LAB — TROPONIN I (HIGH SENSITIVITY): Troponin I (High Sensitivity): 17 ng/L (ref <18)

## 2021-10-05 MED ORDER — GABAPENTIN 600 MG PO TABS
600.0000 mg | ORAL_TABLET | Freq: Two times a day (BID) | ORAL | Status: DC
  Administered 2021-10-06 – 2021-10-08 (×5): 600 mg via ORAL
  Filled 2021-10-05 (×5): qty 1

## 2021-10-05 MED ORDER — DM-GUAIFENESIN ER 30-600 MG PO TB12
1.0000 | ORAL_TABLET | Freq: Two times a day (BID) | ORAL | Status: DC | PRN
  Administered 2021-10-06: 1 via ORAL
  Filled 2021-10-05: qty 1

## 2021-10-05 MED ORDER — METOPROLOL SUCCINATE ER 25 MG PO TB24
12.5000 mg | ORAL_TABLET | Freq: Every day | ORAL | Status: DC
  Administered 2021-10-06 – 2021-10-07 (×2): 12.5 mg via ORAL
  Filled 2021-10-05 (×2): qty 1

## 2021-10-05 MED ORDER — ONDANSETRON HCL 4 MG/2ML IJ SOLN: 4.0000 mg | Freq: Three times a day (TID) | INTRAMUSCULAR | Status: DC | PRN

## 2021-10-05 MED ORDER — TRAZODONE HCL 50 MG PO TABS
50.0000 mg | ORAL_TABLET | Freq: Every day | ORAL | Status: DC
  Administered 2021-10-06 – 2021-10-07 (×3): 50 mg via ORAL
  Filled 2021-10-05 (×3): qty 1

## 2021-10-05 MED ORDER — CRANBERRY SOFT 500 MG PO CHEW: 250.0000 mg | CHEWABLE_TABLET | Freq: Every day | ORAL | Status: DC

## 2021-10-05 MED ORDER — FLUTICASONE PROPIONATE 50 MCG/ACT NA SUSP
2.0000 | Freq: Two times a day (BID) | NASAL | Status: DC
  Administered 2021-10-06 – 2021-10-08 (×5): 2 via NASAL
  Filled 2021-10-05: qty 16

## 2021-10-05 MED ORDER — DIPHENHYDRAMINE HCL 25 MG PO CAPS: 25.0000 mg | ORAL_CAPSULE | Freq: Four times a day (QID) | ORAL | Status: DC | PRN

## 2021-10-05 MED ORDER — ALBUTEROL SULFATE HFA 108 (90 BASE) MCG/ACT IN AERS
2.0000 | INHALATION_SPRAY | Freq: Once | RESPIRATORY_TRACT | Status: AC
  Administered 2021-10-05: 2 via RESPIRATORY_TRACT
  Filled 2021-10-05: qty 6.7

## 2021-10-05 MED ORDER — ALBUTEROL SULFATE (2.5 MG/3ML) 0.083% IN NEBU: 2.5000 mg | INHALATION_SOLUTION | RESPIRATORY_TRACT | Status: DC | PRN

## 2021-10-05 MED ORDER — VITAMIN D 25 MCG (1000 UNIT) PO TABS
5000.0000 [IU] | ORAL_TABLET | Freq: Every day | ORAL | Status: DC
  Administered 2021-10-06 – 2021-10-08 (×3): 5000 [IU] via ORAL
  Filled 2021-10-05 (×3): qty 5

## 2021-10-05 MED ORDER — FUROSEMIDE 10 MG/ML IJ SOLN
40.0000 mg | Freq: Once | INTRAMUSCULAR | Status: AC
  Administered 2021-10-05: 40 mg via INTRAVENOUS
  Filled 2021-10-05: qty 4

## 2021-10-05 MED ORDER — KETOCONAZOLE 2 % EX CREA
1.0000 "application " | TOPICAL_CREAM | Freq: Two times a day (BID) | CUTANEOUS | Status: DC
  Administered 2021-10-06 – 2021-10-08 (×5): 1 via TOPICAL
  Filled 2021-10-05 (×2): qty 15

## 2021-10-05 MED ORDER — PANTOPRAZOLE SODIUM 40 MG PO TBEC: 40.0000 mg | DELAYED_RELEASE_TABLET | Freq: Every day | ORAL | Status: DC | PRN

## 2021-10-05 MED ORDER — LORATADINE 10 MG PO TABS
10.0000 mg | ORAL_TABLET | Freq: Every day | ORAL | Status: DC
  Administered 2021-10-06 – 2021-10-08 (×3): 10 mg via ORAL
  Filled 2021-10-05 (×3): qty 1

## 2021-10-05 MED ORDER — VITAMIN B-12 1000 MCG PO TABS
1000.0000 ug | ORAL_TABLET | Freq: Every day | ORAL | Status: DC
Start: 2021-10-06 — End: 2021-10-08
  Administered 2021-10-06 – 2021-10-08 (×3): 1000 ug via ORAL
  Filled 2021-10-05 (×3): qty 1

## 2021-10-05 MED ORDER — ASCORBIC ACID 500 MG PO TABS
1000.0000 mg | ORAL_TABLET | Freq: Every day | ORAL | Status: DC
  Administered 2021-10-06 – 2021-10-08 (×3): 1000 mg via ORAL
  Filled 2021-10-05 (×3): qty 2

## 2021-10-05 MED ORDER — APIXABAN 5 MG PO TABS
5.0000 mg | ORAL_TABLET | Freq: Two times a day (BID) | ORAL | Status: DC
  Administered 2021-10-06 – 2021-10-08 (×5): 5 mg via ORAL
  Filled 2021-10-05 (×5): qty 1

## 2021-10-05 MED ORDER — LACTATED RINGERS IV BOLUS
500.0000 mL | Freq: Once | INTRAVENOUS | Status: AC
  Administered 2021-10-05: 500 mL via INTRAVENOUS

## 2021-10-05 MED ORDER — ALPRAZOLAM 0.5 MG PO TABS
0.5000 mg | ORAL_TABLET | Freq: Every day | ORAL | Status: DC
  Administered 2021-10-06 – 2021-10-07 (×3): 0.5 mg via ORAL
  Filled 2021-10-05 (×3): qty 1

## 2021-10-05 MED ORDER — HYDROXYCHLOROQUINE SULFATE 200 MG PO TABS
200.0000 mg | ORAL_TABLET | Freq: Two times a day (BID) | ORAL | Status: DC
  Administered 2021-10-06 – 2021-10-08 (×5): 200 mg via ORAL
  Filled 2021-10-05 (×6): qty 1

## 2021-10-05 MED ORDER — MIRABEGRON ER 50 MG PO TB24
50.0000 mg | ORAL_TABLET | Freq: Every day | ORAL | Status: DC
  Administered 2021-10-06 – 2021-10-08 (×3): 50 mg via ORAL
  Filled 2021-10-05 (×3): qty 1

## 2021-10-05 MED ORDER — POLYETHYLENE GLYCOL 3350 17 G PO PACK
17.0000 g | PACK | Freq: Every day | ORAL | Status: DC
  Administered 2021-10-06: 17 g via ORAL
  Filled 2021-10-05 (×2): qty 1

## 2021-10-05 MED ORDER — ACETAMINOPHEN 325 MG PO TABS
650.0000 mg | ORAL_TABLET | Freq: Four times a day (QID) | ORAL | Status: DC | PRN
  Administered 2021-10-06 – 2021-10-08 (×2): 650 mg via ORAL
  Filled 2021-10-05 (×2): qty 2

## 2021-10-05 MED ORDER — PRAVASTATIN SODIUM 20 MG PO TABS
20.0000 mg | ORAL_TABLET | Freq: Every day | ORAL | Status: DC
  Administered 2021-10-06 – 2021-10-08 (×3): 20 mg via ORAL
  Filled 2021-10-05 (×3): qty 1

## 2021-10-05 MED ORDER — PREDNISONE 20 MG PO TABS
60.0000 mg | ORAL_TABLET | Freq: Once | ORAL | Status: AC
  Administered 2021-10-05: 60 mg via ORAL
  Filled 2021-10-05: qty 3

## 2021-10-05 MED ORDER — TURMERIC 500 MG PO CAPS: 500.0000 mg | ORAL_CAPSULE | Freq: Every day | ORAL | Status: DC

## 2021-10-05 MED ORDER — HYDRALAZINE HCL 20 MG/ML IJ SOLN: 5.0000 mg | INTRAMUSCULAR | Status: DC | PRN

## 2021-10-05 MED ORDER — AEROCHAMBER PLUS FLO-VU LARGE MISC
1.0000 | Freq: Once | Status: AC
  Administered 2021-10-05: 1
  Filled 2021-10-05 (×4): qty 1

## 2021-10-05 MED ORDER — ALBUTEROL SULFATE HFA 108 (90 BASE) MCG/ACT IN AERS
2.0000 | INHALATION_SPRAY | RESPIRATORY_TRACT | Status: DC | PRN
  Filled 2021-10-05: qty 6.7

## 2021-10-05 MED ORDER — ALOE PO LIQD: Freq: Every day | ORAL | Status: DC

## 2021-10-05 MED ORDER — IPRATROPIUM-ALBUTEROL 0.5-2.5 (3) MG/3ML IN SOLN
3.0000 mL | RESPIRATORY_TRACT | Status: DC
  Administered 2021-10-05 – 2021-10-07 (×10): 3 mL via RESPIRATORY_TRACT
  Filled 2021-10-05 (×10): qty 3

## 2021-10-05 MED ORDER — OMEGA-3-ACID ETHYL ESTERS 1 G PO CAPS
1.0000 g | ORAL_CAPSULE | Freq: Every day | ORAL | Status: DC
  Administered 2021-10-06 – 2021-10-08 (×3): 1 g via ORAL
  Filled 2021-10-05 (×3): qty 1

## 2021-10-05 MED ORDER — METHYLPREDNISOLONE SODIUM SUCC 125 MG IJ SOLR
120.0000 mg | Freq: Every day | INTRAMUSCULAR | Status: DC
  Administered 2021-10-05: 120 mg via INTRAVENOUS
  Filled 2021-10-05: qty 2

## 2021-10-05 MED ORDER — FOLIC ACID 1 MG PO TABS
1500.0000 ug | ORAL_TABLET | Freq: Every day | ORAL | Status: DC
Start: 2021-10-06 — End: 2021-10-08
  Administered 2021-10-06 – 2021-10-08 (×3): 1.5 mg via ORAL
  Filled 2021-10-05 (×3): qty 2

## 2021-10-05 NOTE — H&P (Signed)
History and Physical    ADIR SCHICKER EGB:[id number] DOB: Apr 03, 1934 DOA: 10/05/2021  Referring MD/NP/PA:   PCP: [name], MD   Patient coming from:  The patient is coming from rehab facility.    Chief Complaint: SOB  HPI: [name] is a 87 y.o. male with medical history significant of hypertension, hyperlipidemia, asthma, TIA, GERD, depression, anxiety, rheumatoid arthritis, renal cell carcinoma (s/p for right nephrectomy), pancytopenia, thrombocytopenia, PAF on Eliquis, OSA not on CPAP, IBS, SIADH, diverticulitis, dCHF, CAD, neurogenic bladder, indwelling Foley catheter placement, who presents with shortness of breath.  Patient was recently hospitalized on 10/15 due to E. coli bacteremia secondary to UTI.  During that admission, patient was incidentally found to have positive COVID on 10/21.  Patient was asymptomatic at that time.  He was isolated for 10 days and discharged to nursing home.   Patient states that he has shortness of breath for several days, which has been progressively worsening.  Patient has productive cough with thick colorless mucus production. No CP, fever or chills.  Patient does not have nausea vomiting, diarrhea or abdominal pain.  No symptoms of UTI.  Pt has poor appetite and generalized weakness. He states that his Foley catheter has been working normally.  Patient states that he has been receiving Xolari injection with good control of his asthma, but since last dose on 08/28/2021, he did not receive injection in facility.  Patient is normally not using oxygen, but was found to have oxygen desaturating to 85-88% on room air.  Currently on 2 L oxygen with 94-98% saturation.  ED Course: pt was found to have pancytopenia with WBC 3.5, hemoglobin 10.3, platelets 91, troponin level 17, electrolytes renal function okay, GFR > 60, temperature normal, blood pressure 142/75, heart rate 81, RR 27, chest x-ray showed cardiomegaly and negative for infiltration.  Patient  is admitted to progressive bed as inpatient.  Review of Systems:   General: no fevers, chills, no body weight gain, has poor appetite, has fatigue HEENT: no blurry vision, hearing changes or sore throat Respiratory: has dyspnea, coughing, wheezing CV: no chest pain, no palpitations GI: no nausea, vomiting, abdominal pain, diarrhea, constipation GU: no dysuria, burning on urination, increased urinary frequency, hematuria  Ext: no leg edema Neuro: no unilateral weakness, numbness, or tingling, no vision change or hearing loss Skin: pt has sacral pressure ulcer MSK: No muscle spasm, no deformity, no limitation of range of movement in spin Heme: No easy bruising.  Travel history: No recent long distant travel.  Allergy:  Allergies  Allergen Reactions   Ciprofloxacin Nausea And Vomiting    Headache   Citalopram Hydrobromide Other (See Comments)    "unknown"   Clindamycin Nausea And Vomiting   Lorazepam Other (See Comments)    Adverse reaction   Paroxetine Nausea Only   Ramipril Other (See Comments)    "unknown"   Simvastatin    Sulfa Antibiotics Other (See Comments)    Past Medical History:  Diagnosis Date   (HFpEF) heart failure with preserved ejection fraction ([place])    a. 02/2019 Echo: Ef 60-65%, mildly reduced RV fxn. RVSP 12mmHg. Mildly dil LA. Mod dil RA. Mild to mod TR. Mild to mod AS. Triv AI.   Allergy    Anemia    Anxiety    Arthritis    rheumatoid   Asthma    Basal cell carcinoma    back   CAD (coronary artery disease)    CHF (congestive heart failure) ([place])  Chronic kidney disease    Mass right kidney   Chronic sinusitis    Collagen vascular disease (HCC)    Rheumatoid Arthritis   Depression    Diverticulitis    Dysrhythmia    ED (erectile dysfunction)    GERD (gastroesophageal reflux disease)    History of SIADH    Hx of adenomatous colonic polyps    Hypertension    Hyponatremia    IBS (irritable bowel syndrome)    Neurodermatitis    Neurogenic  bladder    OSA (obstructive sleep apnea)    no CPAP since weight loss   Persistent atrial fibrillation ([facility])    a. Dx 02/2019; b. CHA2DS2VASc = 6-->eliquis initiated.   Prostate cancer [facility])    Renal mass    10/2018 4.2cm R renal cortical mass. Most compatible w/ clear cell renal cell carcinoma.    Past Surgical History:  Procedure Laterality Date   CARDIOVERSION N/A 04/23/2019   Procedure: CARDIOVERSION;  Surgeon: Nelva Bush, MD;  Location: [facility] ORS;  Service: Cardiovascular;  Laterality: N/A;   KNEE ARTHROPLASTY Right 09/02/2015   Procedure: COMPUTER ASSISTED TOTAL KNEE ARTHROPLASTY;  Surgeon: [name], MD;  Location: [facility] ORS;  Service: Orthopedics;  Laterality: Right;   LAPAROSCOPIC NEPHRECTOMY, HAND ASSISTED Right 06/17/2019   Procedure: HAND ASSISTED LAPAROSCOPIC NEPHRECTOMY;  Surgeon: Hollice Espy, MD;  Location: [facility] ORS;  Service: Urology;  Laterality: Right;   NASAL SINUS SURGERY  2009   DEVIATED SEPTUM AND POLYPS   permanent indwelling catheter     PROSTATE SURGERY     PROSTATECTOMY   TOTAL KNEE ARTHROPLASTY Left 9/15   Dr [name]   URETHRAL STRICTURE DILATATION  02-2010   Dr.Cope    Social History:  reports that he has never smoked. He has never used smokeless tobacco. He reports current alcohol use. He reports that he does not use drugs.  Family History:  Family History  Problem Relation Age of Onset   Heart disease Mother 51       heart failure   Heart failure Mother    Pneumonia Father 35       pnemonia   Skin cancer Father    Skin cancer Sister    Skin cancer Son    Colon cancer Neg Hx    Esophageal cancer Neg Hx    Stomach cancer Neg Hx    Pancreatic cancer Neg Hx    Liver disease Neg Hx      Prior to Admission medications   Medication Sig Start Date End Date Taking? Authorizing Provider  acetaminophen (TYLENOL) 500 MG tablet Take 500 mg by mouth 2 (two) times daily. May take an additional 500mg  as needed for arthritis   Yes [provider]  ALOE PO Take 400 mg by mouth daily.    Yes [provider]  ALPRAZolam (XANAX) 0.5 MG tablet Take 1 tablet (0.5 mg total) by mouth at bedtime. 09/28/21  Yes Max Sane, MD  apixaban (ELIQUIS) 5 MG TABS tablet Take 1 tablet (5 mg total) by mouth 2 (two) times daily. 09/28/21  Yes Max Sane, MD  Ascorbic Acid (VITAMIN C PO) Take 1,000 mg by mouth daily.    Yes [provider]  cetirizine (ZYRTEC) 10 MG tablet Take 10 mg by mouth at bedtime.   Yes [provider]  Cholecalciferol (VITAMIN D3) 5000 UNITS CAPS Take 2,000 Units by mouth daily.    Yes [provider]  Coenzyme Q10 (CO Q 10) 100 MG CAPS Take  100 mg by mouth daily.    Yes [provider]  CRANBERRY SOFT PO Take 250 mg by mouth daily.   Yes [provider]  fluticasone (FLONASE) 50 MCG/ACT nasal spray Place 2 sprays into both nostrils 2 (two) times daily. 04/27/17  Yes [name], MD  folic acid (FOLVITE) 992 MCG tablet Take 1,247 mcg by mouth daily.   Yes [provider]  gabapentin (NEURONTIN) 600 MG tablet Take 600 mg by mouth 2 (two) times daily at 8 am and 10 pm.   Yes [provider]  hydroxychloroquine (PLAQUENIL) 200 MG tablet Take 200 mg by mouth 2 (two) times daily.  01/22/16  Yes [provider]  ketoconazole (NIZORAL) 2 % cream Apply 1 application topically 2 (two) times daily. 02/12/21  Yes Vaillancourt, Samantha, PA-C  ketoconazole (NIZORAL) 2 % shampoo Apply 1 application topically 2 (two) times a week. 06/15/20  Yes [provider]  methotrexate (RHEUMATREX) 2.5 MG tablet Take 10 tablets by mouth once a week. Taken on Wednesdays 07/01/19  Yes [provider]  metoprolol succinate (TOPROL-XL) 25 MG 24 hr tablet Take 0.5 tablets (12.5 mg total) by mouth at bedtime. 09/27/21  Yes Max Sane, MD  milk thistle 175 MG tablet Take 175 mg by mouth daily.   Yes [provider]  MYRBETRIQ 50 MG TB24 tablet TAKE  1 TABLET(50 MG) BY MOUTH DAILY Patient taking differently: Take 50 mg by mouth. 09/09/21  Yes Hollice Espy, MD  Omega-3 Fatty Acids (FISH OIL) 1200 MG CAPS Take 1,200 mg by mouth daily.   Yes [provider]  polyethylene glycol powder (GLYCOLAX/MIRALAX) 17 GM/SCOOP powder Take 17 g by mouth at bedtime.   Yes [provider]  pravastatin (PRAVACHOL) 20 MG tablet TAKE 1 TABLET(20 MG) BY MOUTH AT BEDTIME Patient taking differently: Take 20 mg by mouth daily. 01/18/21  Yes [name], MD  Selenium 200 MCG CAPS Take 200 mcg by mouth daily.    Yes [provider]  traZODone (DESYREL) 50 MG tablet Take 50 mg by mouth at bedtime. 08/12/21  Yes [provider]  triamcinolone ointment (KENALOG) 0.1 % Apply 1 application topically as needed. 10/06/20  Yes [provider]  Turmeric 500 MG CAPS Take 500 mg by mouth daily.    Yes [provider]  vitamin B-12 (CYANOCOBALAMIN) 1000 MCG tablet Take 1,000 mcg by mouth daily.   Yes [provider]  Zinc 50 MG TABS Take by mouth.   Yes [provider]  chlorhexidine (PERIDEX) 0.12 % solution 1 mL by Mouth Rinse route as needed. 10/13/20   [provider]  EPINEPHrine 0.3 mg/0.3 mL IJ SOAJ injection Inject 0.3 mg into the muscle as needed for anaphylaxis.    [provider]  Multiple Vitamins-Minerals (BALANCED CARE SENIORS PO) Take 6 tablets by mouth daily. 3 tablets of fruit and 3 tablets of vegetables daily  Balance of Nature Patient not taking: No sig reported    [provider]  omeprazole (PRILOSEC) 20 MG capsule Take 1 capsule (20 mg total) by mouth daily as needed. 02/09/21   [name], MD  ondansetron (ZOFRAN) 4 MG tablet Take 1 tablet (4 mg total) by mouth every 6 (six) hours as needed for nausea. 09/17/21   Sheikh, Omair Latif, DO  PHOSPHATIDYL CHOLINE PO Take 1 tablet by mouth daily. Patient not taking: No sig reported    [provider]  Probiotic Product (ALIGN) 4 MG CAPS Take  1 capsule (4 mg total) by mouth daily. Patient not taking: No sig reported 12/15/16   Nicholes Mango, MD    Physical Exam: Vitals:   10/05/21 1200 10/05/21 1335 10/05/21 1430 10/05/21 1643  BP: (!) 129/55 (!) 129/52 (!) 142/75   Pulse: 81 70 73   Resp: 18 (!) 27 (!) 24   Temp:      TempSrc:      SpO2: 94% 95% 98%   Weight:    102.1 kg  Height:    5\' 7"  (1.702 m)   General: Not in acute distress HEENT:       Eyes: PERRL, EOMI, no scleral icterus.       ENT: No discharge from the ears and nose, no pharynx injection, no tonsillar enlargement.        Neck: No JVD, no bruit, no mass felt. Heme: No neck lymph node enlargement. Cardiac: S1/S2, RRR, No murmurs, No gallops or rubs. Respiratory: Has wheezing bilaterally GI: Soft, nondistended, nontender, no rebound pain, no organomegaly, BS present. GU: No hematuria Ext: No pitting leg edema bilaterally. 1+DP/PT pulse bilaterally. Musculoskeletal: No joint deformities, No joint redness or warmth, no limitation of ROM in spin. Skin: pt has sacral ulcer.    Neuro: Alert, oriented X3, cranial nerves II-XII grossly intact, moves all extremities normally.   Labs on Admission: I have personally reviewed following labs and imaging studies  CBC: Recent Labs  Lab 10/05/21 1254  WBC 3.5*  NEUTROABS 1.7  HGB 10.3*  HCT 31.1*  MCV 101.3*  PLT 91*   Basic Metabolic Panel: Recent Labs  Lab 10/05/21 1254  NA 134*  K 5.0  CL 98  CO2 30  GLUCOSE 96  BUN 18  CREATININE 0.89  CALCIUM 8.2*   GFR: Estimated Creatinine Clearance: 69.1 mL/min (by C-G formula based on SCr of 0.89 mg/dL). Liver Function Tests: Recent Labs  Lab 10/05/21 1254  AST 19  ALT 12  ALKPHOS 49  BILITOT 0.8  PROT 5.8*  ALBUMIN 3.0*   No results for input(s): LIPASE, AMYLASE in the last 168 hours. No results for input(s): AMMONIA in the last 168 hours. Coagulation Profile: No results for input(s): INR, PROTIME  in the last 168 hours. Cardiac Enzymes: No results for input(s): CKTOTAL, CKMB, CKMBINDEX, TROPONINI in the last 168 hours. BNP (last 3 results) No results for input(s): PROBNP in the last 8760 hours. HbA1C: No results for input(s): HGBA1C in the last 72 hours. CBG: No results for input(s): GLUCAP in the last 168 hours. Lipid Profile: No results for input(s): CHOL, HDL, LDLCALC, TRIG, CHOLHDL, LDLDIRECT in the last 72 hours. Thyroid Function Tests: No results for input(s): TSH, T4TOTAL, FREET4, T3FREE, THYROIDAB in the last 72 hours. Anemia Panel: No results for input(s): VITAMINB12, FOLATE, FERRITIN, TIBC, IRON, RETICCTPCT in the last 72 hours. Urine analysis:    Component Value Date/Time   COLORURINE AMBER (A) 09/12/2021 1119   APPEARANCEUR CLOUDY (A) 09/12/2021 1119   APPEARANCEUR Cloudy (A) 06/06/2019 1555   LABSPEC 1.020 09/12/2021 1119   LABSPEC 1.025 09/28/2014 1610   PHURINE 5.0 09/12/2021 1119   GLUCOSEU NEGATIVE 09/12/2021 1119   GLUCOSEU see comment 09/28/2014 1610   HGBUR MODERATE (A) 09/12/2021 1119   HGBUR trace-lysed 02/22/2010 1536   BILIRUBINUR NEGATIVE 09/12/2021 1119   BILIRUBINUR Negative 06/06/2019 1555   BILIRUBINUR see comment 09/28/2014 1610   KETONESUR NEGATIVE 09/12/2021 1119   PROTEINUR 100 (A) 09/12/2021 1119   UROBILINOGEN 0.2 02/22/2010 1536   NITRITE NEGATIVE 09/12/2021  Agenda (A) 09/12/2021 1119   LEUKOCYTESUR see comment 09/28/2014 1610   Sepsis Labs: @LABRCNTIP (procalcitonin:4,lacticidven:4) )No results found for this or any previous visit (from the past 240 hour(s)).   Radiological Exams on Admission: DG Chest 2 View  Result Date: 10/05/2021 CLINICAL DATA:  Shortness of breath, tested positive for COVID EXAM: CHEST - 2 VIEW COMPARISON:  None. FINDINGS: Heart is enlarged. Prominent atherosclerotic calcification of the aortic arch. Lungs are clear without focal consolidation or large pleural effusion. Advanced  osteoarthritis of bilateral glenohumeral joints. IMPRESSION: 1.  Cardiomegaly.  Prominent atherosclerotic disease of aorta. 2.  No focal consolidation or large pleural effusion. Electronically Signed   By: Keane Police D.O.   On: 10/05/2021 11:06     EKG: I have personally reviewed.  Atrial fibrillation, QTC 511, bifascicular block   Assessment/Plan Principal Problem:   Asthma exacerbation Active Problems:   Benign essential hypertension   Coronary atherosclerosis of native coronary artery   Neurogenic bladder   Chronic rheumatic arthritis (HCC)   Hyperlipidemia   Presence of indwelling urinary catheter   Pancytopenia (HCC)   History of TIA (transient ischemic attack)   Chronic diastolic CHF (congestive heart failure) (HCC)   COVID-19 virus infection   PAF (paroxysmal atrial fibrillation) (HCC)   Acute respiratory failure with hypoxia (HCC)   Sacral decubitus ulcer, stage II (HCC)  Acute respiratory failure with hypoxia due to asthma exacerbation: Patient has cough, shortness of breath, wheezing on  lung auscultation, clinically consistent with asthma exacerbation.  Patient has new 2 L oxygen requirement.  Chest x-ray is negative for infiltration.  -will admit to progressive unit as inpatient -Bronchodilators -Solu-Medrol 60 mg IV bid -Mucinex for cough  -check RVP -Nasal cannula oxygen as needed to maintain O2 saturation 93% or greater  Benign essential hypertension -IV hydralazine as needed -Metoprolol  Coronary atherosclerosis of native coronary artery -Pravastatin, metoprolol  Neurogenic bladder: S/p of Foley cath -Myrbetriq  Chronic rheumatic arthritis (HCC) -Plaquenil  Hyperlipidemia -Pravastatin  Pancytopenia ([place]): Hemoglobin stable.  9.0 on 09/22/2021, he has a hemoglobin 10.3 today -Follow-up with CBC  History of TIA (transient ischemic attack) -On Eliquis for A. Fib -Pravastatin  Chronic diastolic CHF (congestive heart failure) ([place]): 2D echo on  04/28/2020 showed EF CT density 5% with grade 2 diastolic dysfunction.  Patient does not have edema, JVD, pulm edema on chest x-ray, does not seem to have CHF exacerbation.  But BNP is elevated 216, patient is at risk of developing CHF exacerbation. - Give 1 dose of IV Lasix 40 mg x 1 now  COVID-19 virus infection: Patient was found incidentally COVID-positive on 10/21, that time patient was asymptomatic. Now is beyond the window for treatment. -Bronchodilators as above -No isolation needed  PAF (paroxysmal atrial fibrillation) (HCC) -Continue Eliquis, metoprolol  Sacral decubitus ulcer, stage II: -wound care consult     DVT ppx: on eliquis Code Status: Full code Family Communication:  Yes, patient's  wife  at bed side Disposition Plan:  Anticipate discharge back to previous environment, rehab facility Consults called:  none Admission status and Level of care: Progressive:    as inpt      Status is: Inpatient  Remains inpatient appropriate because: Patient has multiple comorbidities, now presents with acute respiratory failure with hypoxia due to asthma exacerbation.  His presentation is highly complicated.  Given his older age, patient is at high risk of deteriorating.  Will need to be treated in the hospital for at least 2  days.          Date of Service 10/05/2021    Ivor Costa [facility]   If 7PM-7AM, please contact night-coverage [email or web address] 10/05/2021, 5:55 PM

## 2021-10-05 NOTE — ED Provider Notes (Signed)
[facility] Emergency Department Provider Note   ____________________________________________   Event Date/Time   First MD Initiated Contact with Patient 10/05/21 1147     (approximate)  I have reviewed the triage vital signs and the nursing notes.   HISTORY  Chief Complaint Cough and Shortness of Breath    HPI [name] is a 87 y.o. male with past medical history of hypertension, diastolic CHF, atrial fibrillation on Eliquis, CKD, rheumatoid arthritis, chronic hyponatremia, urinary retention status post Foley, and right nephrectomy who presents to the ED complaining of shortness of breath.  Patient was recently admitted to the hospital for catheter associated UTI, required extended stay in the hospital after he incidentally tested positive for COVID-19 on October 21 and required quarantine prior to SNF placement.  He states he was feeling fine at the time he was discharged to SNF, but has developed increasing cough and difficulty breathing over the past few days.  Cough has been productive of thick colorless sputum and he especially noticed that he was having difficulty breathing earlier today.  He denies any fevers and has not had any pain in his chest.  He does state that his appetite has been decreased and he has been feeling nauseous, but has not vomited.  He denies any abdominal pain or diarrhea, Foley catheter has been working normally.        Past Medical History:  Diagnosis Date   (HFpEF) heart failure with preserved ejection fraction ([place])    a. 02/2019 Echo: Ef 60-65%, mildly reduced RV fxn. RVSP 84mmHg. Mildly dil LA. Mod dil RA. Mild to mod TR. Mild to mod AS. Triv AI.   Allergy    Anemia    Anxiety    Arthritis    rheumatoid   Asthma    Basal cell carcinoma    back   CAD (coronary artery disease)    CHF (congestive heart failure) (HCC)    Chronic kidney disease    Mass right kidney   Chronic sinusitis    Collagen vascular disease  (HCC)    Rheumatoid Arthritis   Depression    Diverticulitis    Dysrhythmia    ED (erectile dysfunction)    GERD (gastroesophageal reflux disease)    History of SIADH    Hx of adenomatous colonic polyps    Hypertension    Hyponatremia    IBS (irritable bowel syndrome)    Neurodermatitis    Neurogenic bladder    OSA (obstructive sleep apnea)    no CPAP since weight loss   Persistent atrial fibrillation ([place])    a. Dx 02/2019; b. CHA2DS2VASc = 6-->eliquis initiated.   Prostate cancer [facility])    Renal mass    10/2018 4.2cm R renal cortical mass. Most compatible w/ clear cell renal cell carcinoma.    Patient Active Problem List   Diagnosis Date Noted   COVID-19 virus infection 09/19/2021   E coli bacteremia    Acute kidney injury superimposed on CKD ([place]) 09/11/2021   Hypotension    Thrombocytopenia (HCC)    Chronic diastolic CHF (congestive heart failure) ([place])    Weakness    Neuropathy    Severe persistent asthma 02/05/2021   Postprocedure pain management follow-up 12/08/2020   Spondylosis without myelopathy or radiculopathy, lumbosacral region 11/24/2020   Elevated C-reactive protein (CRP) 11/24/2020   Elevated sed rate 11/24/2020   Chronic pain syndrome 11/04/2020   Pharmacologic therapy 11/04/2020   Disorder of skeletal system 11/04/2020   Problems  influencing health status 11/04/2020   Abnormal MRI, lumbar spine (09/21/2020) 11/04/2020   Chronic low back pain (Bilateral) w/o sciatica 11/04/2020   DDD (degenerative disc disease), lumbosacral 11/04/2020   Lumbosacral foraminal stenosis 11/04/2020   Lumbar facet hypertrophy 11/04/2020   Levoscoliosis of lumbosacral spine 11/04/2020   Grade 1 Retrolisthesis at L2-3 and L3-4 11/04/2020   Chronic anticoagulation (Eliquis) 11/04/2020   Lumbar facet syndrome 11/04/2020   Long term prescription benzodiazepine use 11/04/2020   Chronic shoulder pain (Bilateral) 11/04/2020   Chronic elbow pain (Bilateral) 11/04/2020   Chronic  wrist pain (Bilateral) 11/04/2020   Chronic sinusitis 07/12/2020   Persistent atrial fibrillation ([place]) 07/12/2020   Pancytopenia ([place]) 09/12/2019   Edema of lower extremity 09/12/2019   Hyposmolality and/or hyponatremia 09/12/2019   Renal cell carcinoma ([place]) 07/11/2019   AF (paroxysmal atrial fibrillation) ([place]) 05/09/2019   History of TIA (transient ischemic attack) 12/06/2018   Vision changes 12/06/2018   TIA (transient ischemic attack) 06/06/2018   Carotid artery disease ([place]) 06/06/2018   Frailty 04/24/2018   Long term current use of immunosuppressive drug 04/24/2018   Obesity (BMI 30-39.9) 04/24/2018   Fever 11/02/2017   SIADH (syndrome of inappropriate ADH production) ([place]) 04/17/2017   AKI (acute kidney injury) ([place]) 04/03/2017   Hyponatremia 01/16/2017   Urinary tract infection associated with indwelling urethral catheter ([place]) 12/19/2016   Asthma with acute exacerbation 11/14/2016   Mild intermittent asthma without complication 37/16/9678   Mood disorder ([place]) 01/18/2016   Presence of indwelling urinary catheter 08/20/2015   Advanced directives, counseling/discussion 07/28/2014   Routine general medical examination at a health care facility 04/09/2012   Venous stasis dermatitis 06/13/2011   Sleep disturbance 12/21/2010   CONSTIPATION, CHRONIC 12/07/2010   Chronic constipation 12/07/2010   Hyperlipidemia 12/02/2010   OSTEOARTHRITIS 12/02/2010   Neurogenic bladder 05/24/2010   IBS 05/05/2010   History of colonic polyps 05/05/2010   NEUROPATHY 05/03/2010   Mononeuritis 05/03/2010   Personal history of prostate cancer 03/04/2010   Benign essential hypertension 03/25/2009   GAD (generalized anxiety disorder) 01/27/2007   Coronary atherosclerosis of native coronary artery 01/27/2007   Allergic asthma 01/27/2007   GERD 01/27/2007   Chronic rheumatic arthritis ([place]) 01/27/2007   Rheumatoid arthritis involving multiple sites with positive rheumatoid factor ([place])  01/27/2007    Past Surgical History:  Procedure Laterality Date   CARDIOVERSION N/A 04/23/2019   Procedure: CARDIOVERSION;  Surgeon: Nelva Bush, MD;  Location: [facility] ORS;  Service: Cardiovascular;  Laterality: N/A;   KNEE ARTHROPLASTY Right 09/02/2015   Procedure: COMPUTER ASSISTED TOTAL KNEE ARTHROPLASTY;  Surgeon: [name], MD;  Location: [facility] ORS;  Service: Orthopedics;  Laterality: Right;   LAPAROSCOPIC NEPHRECTOMY, HAND ASSISTED Right 06/17/2019   Procedure: HAND ASSISTED LAPAROSCOPIC NEPHRECTOMY;  Surgeon: Hollice Espy, MD;  Location: [facility] ORS;  Service: Urology;  Laterality: Right;   NASAL SINUS SURGERY  2009   DEVIATED SEPTUM AND POLYPS   permanent indwelling catheter     PROSTATE SURGERY     PROSTATECTOMY   TOTAL KNEE ARTHROPLASTY Left 9/15   Dr [name]   URETHRAL STRICTURE DILATATION  02-2010   Dr.Cope    Prior to Admission medications   Medication Sig Start Date End Date Taking? Authorizing Provider  acetaminophen (TYLENOL) 500 MG tablet Take 500 mg by mouth 2 (two) times daily. May take an additional 500mg  as needed for arthritis   Yes [provider]  ALOE PO Take 400 mg by mouth daily.    Yes [provider]  ALPRAZolam (XANAX) 0.5 MG tablet Take 1 tablet (0.5 mg total) by mouth at bedtime. 09/28/21  Yes Max Sane, MD  apixaban (ELIQUIS) 5 MG TABS tablet Take 1 tablet (5 mg total) by mouth 2 (two) times daily. 09/28/21  Yes Max Sane, MD  Ascorbic Acid (VITAMIN C PO) Take 1,000 mg by mouth daily.    Yes [provider]  cetirizine (ZYRTEC) 10 MG tablet Take 10 mg by mouth at bedtime.   Yes [provider]  Cholecalciferol (VITAMIN D3) 5000 UNITS CAPS Take 2,000 Units by mouth daily.    Yes [provider]  Coenzyme Q10 (CO Q 10) 100 MG CAPS Take 100 mg by mouth daily.    Yes [provider]  CRANBERRY SOFT PO Take 250 mg by mouth daily.   Yes [provider]  fluticasone (FLONASE) 50 MCG/ACT nasal  spray Place 2 sprays into both nostrils 2 (two) times daily. 04/27/17  Yes [name], MD  folic acid (FOLVITE) 638 MCG tablet Take 1,247 mcg by mouth daily.   Yes [provider]  gabapentin (NEURONTIN) 600 MG tablet Take 600 mg by mouth 2 (two) times daily at 8 am and 10 pm.   Yes [provider]  hydroxychloroquine (PLAQUENIL) 200 MG tablet Take 200 mg by mouth 2 (two) times daily.  01/22/16  Yes [provider]  ketoconazole (NIZORAL) 2 % cream Apply 1 application topically 2 (two) times daily. 02/12/21  Yes Vaillancourt, Samantha, PA-C  ketoconazole (NIZORAL) 2 % shampoo Apply 1 application topically 2 (two) times a week. 06/15/20  Yes [provider]  methotrexate (RHEUMATREX) 2.5 MG tablet Take 10 tablets by mouth once a week. Taken on Wednesdays 07/01/19  Yes [provider]  metoprolol succinate (TOPROL-XL) 25 MG 24 hr tablet Take 0.5 tablets (12.5 mg total) by mouth at bedtime. 09/27/21  Yes Max Sane, MD  milk thistle 175 MG tablet Take 175 mg by mouth daily.   Yes [provider]  MYRBETRIQ 50 MG TB24 tablet TAKE 1 TABLET(50 MG) BY MOUTH DAILY Patient taking differently: Take 50 mg by mouth. 09/09/21  Yes Hollice Espy, MD  Omega-3 Fatty Acids (FISH OIL) 1200 MG CAPS Take 1,200 mg by mouth daily.   Yes [provider]  polyethylene glycol powder (GLYCOLAX/MIRALAX) 17 GM/SCOOP powder Take 17 g by mouth at bedtime.   Yes [provider]  pravastatin (PRAVACHOL) 20 MG tablet TAKE 1 TABLET(20 MG) BY MOUTH AT BEDTIME Patient taking differently: Take 20 mg by mouth daily. 01/18/21  Yes [name], MD  Selenium 200 MCG CAPS Take 200 mcg by mouth daily.    Yes [provider]  traZODone (DESYREL) 50 MG tablet Take 50 mg by mouth at bedtime. 08/12/21  Yes [provider]  triamcinolone ointment (KENALOG) 0.1 % Apply 1 application topically as needed. 10/06/20  Yes [provider]  Turmeric  500 MG CAPS Take 500 mg by mouth daily.    Yes [provider]  vitamin B-12 (CYANOCOBALAMIN) 1000 MCG tablet Take 1,000 mcg by mouth daily.   Yes [provider]  Zinc 50 MG TABS Take by mouth.   Yes [provider]  chlorhexidine (PERIDEX) 0.12 % solution 1 mL by Mouth Rinse route as needed. 10/13/20   [provider]  EPINEPHrine 0.3 mg/0.3 mL IJ SOAJ injection Inject 0.3 mg into the muscle as needed for anaphylaxis.    [provider]  Multiple Vitamins-Minerals (BALANCED CARE SENIORS PO) Take  6 tablets by mouth daily. 3 tablets of fruit and 3 tablets of vegetables daily  Balance of Nature Patient not taking: No sig reported    [provider]  omeprazole (PRILOSEC) 20 MG capsule Take 1 capsule (20 mg total) by mouth daily as needed. 02/09/21   [name], MD  ondansetron (ZOFRAN) 4 MG tablet Take 1 tablet (4 mg total) by mouth every 6 (six) hours as needed for nausea. 09/17/21   Sheikh, Omair Latif, DO  PHOSPHATIDYL CHOLINE PO Take 1 tablet by mouth daily. Patient not taking: No sig reported    [provider]  Probiotic Product (ALIGN) 4 MG CAPS Take 1 capsule (4 mg total) by mouth daily. Patient not taking: No sig reported 12/15/16   Nicholes Mango, MD    Allergies Ciprofloxacin, Citalopram hydrobromide, Clindamycin, Lorazepam, Paroxetine, Ramipril, Simvastatin, and Sulfa antibiotics  Family History  Problem Relation Age of Onset   Heart disease Mother 62       heart failure   Heart failure Mother    Pneumonia Father 9       pnemonia   Skin cancer Father    Skin cancer Sister    Skin cancer Son    Colon cancer Neg Hx    Esophageal cancer Neg Hx    Stomach cancer Neg Hx    Pancreatic cancer Neg Hx    Liver disease Neg Hx     Social History Social History   Tobacco Use   Smoking status: Never   Smokeless tobacco: Never  Vaping Use   Vaping Use: Never used  Substance Use Topics   Alcohol use: Yes     Comment: 5 days out of 7 drinks scotch   Drug use: No    Review of Systems  Constitutional: No fever/chills Eyes: No visual changes. ENT: No sore throat. Cardiovascular: Denies chest pain. Respiratory: Positive for cough and shortness of breath. Gastrointestinal: No abdominal pain.  Positive for nausea, no vomiting.  No diarrhea.  No constipation. Genitourinary: Negative for dysuria. Musculoskeletal: Negative for back pain. Skin: Negative for rash. Neurological: Negative for headaches, focal weakness or numbness.  ____________________________________________   PHYSICAL EXAM:  VITAL SIGNS: ED Triage Vitals  Enc Vitals Group     BP 10/05/21 1034 (!) 120/45     Pulse Rate 10/05/21 1034 81     Resp 10/05/21 1034 (!) 24     Temp 10/05/21 1034 98.9 F (37.2 C)     Temp Source 10/05/21 1034 Oral     SpO2 10/05/21 1034 98 %     Weight --      Height --      Head Circumference --      Peak Flow --      Pain Score 10/05/21 1031 0     Pain Loc --      Pain Edu? --      Excl. in [place]? --     Constitutional: Alert and oriented. Eyes: Conjunctivae are normal. Head: Atraumatic. Nose: No congestion/rhinnorhea. Mouth/Throat: Mucous membranes are moist. Neck: Normal ROM Cardiovascular: Normal rate, regular rhythm. Grossly normal heart sounds.  2+ radial pulses bilaterally. Respiratory: Increased respiratory effort, tachypneic with faint expiratory wheezing. Gastrointestinal: Soft and nontender. No distention. Genitourinary: deferred Musculoskeletal: No lower extremity tenderness nor edema. Neurologic:  Normal speech and language. No gross focal neurologic deficits are appreciated. Skin:  Skin is warm, dry and intact. No rash noted. Psychiatric: Mood and affect are normal. Speech and behavior are normal.  ____________________________________________   LABS (all labs ordered are listed, but only abnormal results are displayed)  Labs Reviewed  CBC WITH DIFFERENTIAL/PLATELET -  Abnormal; Notable for the following components:      Result Value   WBC 3.5 (*)    RBC 3.07 (*)    Hemoglobin 10.3 (*)    HCT 31.1 (*)    MCV 101.3 (*)    Platelets 91 (*)    Eosinophils Absolute 0.6 (*)    All other components within normal limits  COMPREHENSIVE METABOLIC PANEL - Abnormal; Notable for the following components:   Sodium 134 (*)    Calcium 8.2 (*)    Total Protein 5.8 (*)    Albumin 3.0 (*)    All other components within normal limits  BRAIN NATRIURETIC PEPTIDE  TROPONIN I (HIGH SENSITIVITY)  TROPONIN I (HIGH SENSITIVITY)   ____________________________________________  EKG  ED ECG REPORT I, Blake Divine, the attending physician, personally viewed and interpreted this ECG.   Date: 10/05/2021  EKG Time: 11:07  Rate: 79  Rhythm: normal sinus rhythm  Axis: LAD  Intervals:first-degree A-V block  and right bundle branch block  ST&T Change: None   PROCEDURES  Procedure(s) performed (including Critical Care):  .Critical Care Performed by: Blake Divine, MD Authorized by: Blake Divine, MD   Critical care provider statement:    Critical care time (minutes):  45   Critical care time was exclusive of:  Separately billable procedures and treating other patients and teaching time   Critical care was necessary to treat or prevent imminent or life-threatening deterioration of the following conditions:  Respiratory failure   Critical care was time spent personally by me on the following activities:  Ordering and performing treatments and interventions, ordering and review of laboratory studies, ordering and review of radiographic studies, pulse oximetry, re-evaluation of patient's condition, review of old charts, development of treatment plan with patient or surrogate, evaluation of patient's response to treatment, examination of patient and obtaining history from patient or surrogate   I assumed direction of critical care for this patient from another provider  in my specialty: no     Care discussed with: admitting provider     ____________________________________________   INITIAL IMPRESSION / [place] / ED COURSE      87 year old male with past medical history of hypertension, diastolic CHF, atrial fibrillation on Eliquis, CKD, rheumatoid arthritis, chronic hyponatremia, urinary retention status post Foley, and right nephrectomy who presents to the ED complaining of cough and increasing difficulty breathing over the past 3 days.  Patient is mildly tachypneic with increased work of breathing, noted to have room air sats on of 85% and that is improved following placement on 2 L nasal cannula.  He has some end expiratory wheezing and we will treat with steroids as well as albuterol.  Unfortunately, with his recent positive test for COVID-19 we will be unable to provide nebulized breathing treatments.  Chest x-ray reviewed by me and shows no infiltrate, edema, or effusion.  EKG shows no evidence of arrhythmia or ischemia, troponin is pending but low suspicion for ACS.  Troponin within normal limits and low suspicion for ACS.  Also low suspicion for PE as patient has been on Eliquis and has not missed any doses.  Patient reports feeling better following dose of albuterol and steroids.  He was trialed off of oxygen but once again dropped O2 sats to the mid 80s.  Plan to discuss with hospitalist for admission for further management of  asthma exacerbation and hypoxic respiratory failure.      ____________________________________________   FINAL CLINICAL IMPRESSION(S) / ED DIAGNOSES  Final diagnoses:  Shortness of breath  Exacerbation of asthma, unspecified asthma severity, unspecified whether persistent  Acute respiratory failure with hypoxia [facility])     ED Discharge Orders     None        Note:  This document was prepared using Dragon voice recognition software and may include unintentional dictation errors.    Blake Divine,  MD 10/05/21 1450

## 2021-10-05 NOTE — ED Notes (Signed)
When administering inhaler, pt states they use a spacer. Called pharmacy and requested a spacer for pt.

## 2021-10-05 NOTE — ED Triage Notes (Signed)
First RN Note: Pt to ED via [facility] from [facility] in [place] getting rehab. Per EMS pt tested positive for covid, woke up this morning with SOB. Per EMS RA sat 88%, placed on 2L via [place] and was sent back for further eval.   99-100% 2L

## 2021-10-06 LAB — CBC
HCT: 29.9 % — ABNORMAL LOW (ref 39.0–52.0)
HCT: 31.5 % — ABNORMAL LOW (ref 39.0–52.0)
Hemoglobin: 10.2 g/dL — ABNORMAL LOW (ref 13.0–17.0)
Hemoglobin: 10.7 g/dL — ABNORMAL LOW (ref 13.0–17.0)
MCH: 33.7 pg (ref 26.0–34.0)
MCH: 32.9 pg (ref 26.0–34.0)
MCHC: 34.1 g/dL (ref 30.0–36.0)
MCHC: 34 g/dL (ref 30.0–36.0)
MCV: 98.7 fL (ref 80.0–100.0)
MCV: 96.9 fL (ref 80.0–100.0)
Platelets: 90 10*3/uL — ABNORMAL LOW (ref 150–400)
Platelets: 110 10*3/uL — ABNORMAL LOW (ref 150–400)
RBC: 3.03 MIL/uL — ABNORMAL LOW (ref 4.22–5.81)
RBC: 3.25 MIL/uL — ABNORMAL LOW (ref 4.22–5.81)
RDW: 14.1 % (ref 11.5–15.5)
RDW: 14 % (ref 11.5–15.5)
WBC: 1.1 10*3/uL — CL (ref 4.0–10.5)
WBC: 2.6 10*3/uL — ABNORMAL LOW (ref 4.0–10.5)
nRBC: 0 % (ref 0.0–0.2)
nRBC: 0 % (ref 0.0–0.2)

## 2021-10-06 LAB — RESPIRATORY PANEL BY PCR

## 2021-10-06 LAB — BASIC METABOLIC PANEL
Anion gap: 10 (ref 5–15)
BUN: 29 mg/dL — ABNORMAL HIGH (ref 8–23)
CO2: 26 mmol/L (ref 22–32)
Calcium: 8.4 mg/dL — ABNORMAL LOW (ref 8.9–10.3)
Chloride: 96 mmol/L — ABNORMAL LOW (ref 98–111)
Creatinine, Ser: 0.99 mg/dL (ref 0.61–1.24)
GFR, Estimated: >60 mL/min (ref >60)
Glucose, Bld: 142 mg/dL — ABNORMAL HIGH (ref 70–99)
Potassium: 4 mmol/L (ref 3.5–5.1)
Sodium: 132 mmol/L — ABNORMAL LOW (ref 135–145)

## 2021-10-06 LAB — PHOSPHORUS: Phosphorus: 3.7 mg/dL (ref 2.5–4.6)

## 2021-10-06 LAB — VITAMIN D 25 HYDROXY (VIT D DEFICIENCY, FRACTURES): Vit D, 25-Hydroxy: 45.87 ng/mL (ref 30–100)

## 2021-10-06 LAB — MAGNESIUM: Magnesium: 1.9 mg/dL (ref 1.7–2.4)

## 2021-10-06 MED ORDER — METHYLPREDNISOLONE SODIUM SUCC 125 MG IJ SOLR
INTRAMUSCULAR | Status: AC
  Administered 2021-10-06: 125 mg
  Filled 2021-10-06: qty 2

## 2021-10-06 MED ORDER — METHYLPREDNISOLONE SODIUM SUCC 40 MG IJ SOLR: 40.0000 mg | Freq: Every day | INTRAMUSCULAR | Status: DC

## 2021-10-06 MED ORDER — METHYLPREDNISOLONE SODIUM SUCC 40 MG IJ SOLR
40.0000 mg | Freq: Two times a day (BID) | INTRAMUSCULAR | Status: DC
  Administered 2021-10-07: 40 mg via INTRAVENOUS
  Filled 2021-10-06: qty 1

## 2021-10-06 NOTE — Consult Note (Signed)
[place] Nurse Consult Note: Patient receiving care in [facility] [address]. Patient  can turn to sides, but is very verbal that he does not like to turn and prefers not to. Reason for Consult: sacral wound Wound type: evolving DTPI to right buttock (8 cm x 1 cm) and beginning of breakdown to left buttock. Pressure Injury POA: Yes Measurement: 8 cm x 1 cm Wound bed: deep purple Drainage (amount, consistency, odor) none Periwound: impacted by fungal overgrowth to the buttocks, scrotum, inner/upper thighs, periarea Dressing procedure/placement/frequency: Wash the upper inner thighs, periarea, buttocks, scrotum with soap and water, pat dry. Sprinkle antifungal powder (green and white bottle in clean utility) to these areas.  I have ordered the use of bilateral Prevalon heel lift boots, twice daily cleansing and use of antifungal powder to affected areas, and placement of a standard size bed with air mattress.  Also, specific turning instructions.  Monitor the wound area(s) for worsening of condition such as: Signs/symptoms of infection,  Increase in size,  Development of or worsening of odor, Development of pain, or increased pain at the affected locations.  Notify the medical team if any of these develop.  Thank you for the consult.  Discussed plan of care with the patient and bedside nightshift nurse.  [place] nurse will not follow at this time.  Please re-consult the [place] team if needed.  Val Riles, RN, MSN, CWOCN, CNS-BC, pager [phone]

## 2021-10-06 NOTE — Plan of Care (Signed)

## 2021-10-06 NOTE — ED Notes (Signed)
Pt positioned on his right side at this time.

## 2021-10-06 NOTE — ED Notes (Signed)
Pt transferred from ER stretcher to hospital bed. Pt positioned supine at this time.

## 2021-10-06 NOTE — Progress Notes (Signed)
[facility] Progress Note  Patient: [name]    ZOX:[id number]  DOA: 10/05/2021     Date of Service: the patient was seen and examined on 10/06/2021  Chief Complaint  Patient presents with   Cough   Shortness of Breath   Brief hospital course: [name] is a 87 y.o. male with medical history significant of hypertension, hyperlipidemia, asthma, TIA, GERD, depression, anxiety, rheumatoid arthritis, renal cell carcinoma (s/p for right nephrectomy), pancytopenia, thrombocytopenia, PAF on Eliquis, OSA not on CPAP, IBS, SIADH, diverticulitis, dCHF, CAD, neurogenic bladder, indwelling Foley catheter placement, who presents with shortness of breath.   Patient was recently hospitalized on 10/15 due to E. coli bacteremia secondary to UTI.  During that admission, patient was incidentally found to have positive COVID on 10/21.  Patient was asymptomatic at that time.  He was isolated for 10 days and discharged to nursing home.    Patient states that he has shortness of breath for several days, which has been progressively worsening.  Patient has productive cough with thick colorless mucus production. No CP, fever or chills.  Patient does not have nausea vomiting, diarrhea or abdominal pain.  No symptoms of UTI.  Pt has poor appetite and generalized weakness. He states that his Foley catheter has been working normally.  Patient states that he has been receiving Xolari injection with good control of his asthma, but since last dose on 08/28/2021, he did not receive injection in facility.   Patient is normally not using oxygen, but was found to have oxygen desaturating to 85-88% on room air.  Currently on 2 L oxygen with 94-98% saturation.   ED Course: pt was found to have pancytopenia with WBC 3.5, hemoglobin 10.3, platelets 91, troponin level 17, electrolytes renal function okay, GFR > 60, temperature normal, blood pressure 142/75, heart rate 81, RR 27, chest x-ray showed cardiomegaly and negative for  infiltration.  Patient is admitted to progressive bed as inpatient.    Assessment and Plan: Principal Problem:   Asthma exacerbation Active Problems:   Benign essential hypertension   Coronary atherosclerosis of native coronary artery   Neurogenic bladder   Chronic rheumatic arthritis (HCC)   Hyperlipidemia   Presence of indwelling urinary catheter   Pancytopenia (HCC)   History of TIA (transient ischemic attack)   Chronic diastolic CHF (congestive heart failure) (HCC)   COVID-19 virus infection   PAF (paroxysmal atrial fibrillation) (HCC)   Acute respiratory failure with hypoxia (HCC)   Sacral decubitus ulcer, stage II (HCC)   Acute respiratory failure with hypoxia due to asthma exacerbation: Patient has cough, shortness of breath, wheezing on  lung auscultation, clinically consistent with asthma exacerbation.  Patient has new 2 L oxygen requirement.  Chest x-ray is negative for infiltration.  -continue Bronchodilators -s/p Solu-Medrol 120 mg daily, changed to 40 mg IV every 12 hourly on 11/9 Continue Claritin 10 mg p.o. daily -Mucinex for cough  - RVP negative  -Nasal cannula oxygen as needed to maintain O2 saturation 93% or greater   Benign essential hypertension -IV hydralazine as needed -Metoprolol   Coronary atherosclerosis of native coronary artery -Pravastatin, metoprolol   Neurogenic bladder: S/p of Foley cath, Foley catheter was changed on 09/13/2021 -Myrbetriq 11/9 stated that Foley catheter is leaking, consulted urology for further evaluation   Chronic rheumatic arthritis ([place]) -Plaquenil   Hyperlipidemia -Pravastatin   Pancytopenia ([place]): Hemoglobin stable.  9.0 on 09/22/2021,  hemoglobin 10.7 today   History of TIA (transient ischemic attack) -On Eliquis  for A. Fib -Pravastatin   Chronic diastolic CHF (congestive heart failure) ([place]): 2D echo on 04/28/2020 showed EF CT density 5% with grade 2 diastolic dysfunction.  Patient does not have edema, JVD,  pulm edema on chest x-ray, does not seem to have CHF exacerbation.  But BNP is elevated 216, patient is at risk of developing CHF exacerbation. - s/p 1 dose of IV Lasix 40 mg x 1    COVID-19 virus infection: Patient was found incidentally COVID-positive on 10/21, that time patient was asymptomatic. Now is beyond the window for treatment. -Bronchodilators as above -No isolation needed   PAF (paroxysmal atrial fibrillation) (HCC) -Continue Eliquis, metoprolol   Sacral decubitus ulcer, stage II: -wound care consult Turn patient every 2 hourly, may use waffle cushion   Body mass index is 35.25 kg/m.  Interventions:   Pressure Injury 10/05/21 Buttocks Mid Stage 1 -  Intact skin with non-blanchable redness of a localized area usually over a bony prominence. (Active)  10/05/21 1730  Location: Buttocks  Location Orientation: Mid  Staging: Stage 1 -  Intact skin with non-blanchable redness of a localized area usually over a bony prominence.  Wound Description (Comments):   Present on Admission: Yes     Diet: Heart healthy DVT Prophylaxis: Therapeutic Anticoagulation with Eliquis    Advance goals of care discussion: Full code  Family Communication: family was not present at bedside, at the time of interview.  The pt provided permission to discuss medical plan with the family. Opportunity was given to ask question and all questions were answered satisfactorily.   Disposition:  Pt is from SNF, admitted with SOb, still has respiratory failure, which precludes a safe discharge. Discharge to SNF, when stable most likely in 1 to 2 days.  Subjective: No significant overnight events, patient is still having some shortness of breath, using supplemental O2 admission on 2 L when nasal cannula, mild productive cough, no any other active issues.   Physical Exam: General:  alert oriented to time, place, and person.  Appear in no distress, affect appropriate Eyes: PERRLA ENT: Oral Mucosa Clear,  moist  Neck: no JVD,  Cardiovascular: S1 and S2 Present, no Murmur,  Respiratory: good respiratory effort, Bilateral Air entry equal and Decreased, mild Crackles, mild wheezes Abdomen: Bowel Sound present, Soft and no tenderness,  Skin: no rashes Extremities: no Pedal edema, no calf tenderness Neurologic: without any new focal findings Gait not checked due to patient safety concerns  Vitals:   10/06/21 1127 10/06/21 1252 10/06/21 1548 10/06/21 1604  BP:  (!) 108/55 (!) 125/58   Pulse:  100 94   Resp:  17 18   Temp:  97.7 F (36.5 C) 97.8 F (36.6 C)   TempSrc:      SpO2: 98% 97% 95% 100%  Weight:      Height:        Intake/Output Summary (Last 24 hours) at 10/06/2021 1605 Last data filed at 10/06/2021 1550 Gross per 24 hour  Intake --  Output 0 ml  Net 0 ml   Filed Weights   10/05/21 1643  Weight: 102.1 kg    Data Reviewed: I have personally reviewed and interpreted daily labs, tele strips, imagings as discussed above. I reviewed all nursing notes, pharmacy notes, vitals, pertinent old records I have discussed plan of care as described above with RN and patient/family.  CBC: Recent Labs  Lab 10/05/21 1254 10/06/21 0418 10/06/21 1054  WBC 3.5* 1.1* 2.6*  NEUTROABS 1.7  --   --  HGB 10.3* 10.2* 10.7*  HCT 31.1* 29.9* 31.5*  MCV 101.3* 98.7 96.9  PLT 91* 90* 757*   Basic Metabolic Panel: Recent Labs  Lab 10/05/21 1254 10/06/21 1054  NA 134* 132*  K 5.0 4.0  CL 98 96*  CO2 30 26  GLUCOSE 96 142*  BUN 18 29*  CREATININE 0.89 0.99  CALCIUM 8.2* 8.4*  MG  --  1.9  PHOS  --  3.7    Studies: No results found.  Scheduled Meds:  ALPRAZolam  0.5 mg Oral QHS   apixaban  5 mg Oral BID   vitamin C  1,000 mg Oral Daily   cholecalciferol  5,000 Units Oral Daily   fluticasone  2 spray Each Nare BID   folic acid  9,728 mcg Oral Daily   gabapentin  600 mg Oral BID AC & HS   hydroxychloroquine  200 mg Oral BID   ipratropium-albuterol  3 mL Nebulization Q4H    ketoconazole  1 application Topical BID   loratadine  10 mg Oral Daily   methylPREDNISolone (SOLU-MEDROL) injection  120 mg Intravenous Daily   metoprolol succinate  12.5 mg Oral QHS   mirabegron ER  50 mg Oral Daily   omega-3 acid ethyl esters  1 g Oral Daily   polyethylene glycol  17 g Oral QHS   pravastatin  20 mg Oral Daily   traZODone  50 mg Oral QHS   vitamin B-12  1,000 mcg Oral Daily   Continuous Infusions: PRN Meds: acetaminophen, albuterol, dextromethorphan-guaiFENesin, diphenhydrAMINE, hydrALAZINE, pantoprazole  Time spent: 35 minutes  Author: Val Riles. MD [facility] 10/06/2021 4:05 PM  To reach On-call, see care teams to locate the attending and reach out to them via [email or web address]. If 7PM-7AM, please contact night-coverage If you still have difficulty reaching the attending provider, please page the [facility] (Director on Call) for [facility] on amion for assistance.

## 2021-10-06 NOTE — ED Notes (Signed)
Pt positioned on his left side at this time.

## 2021-10-06 NOTE — Progress Notes (Signed)
Critical lab  WBC 1.1  MD notified

## 2021-10-06 NOTE — Progress Notes (Signed)
PHARMACIST - PHYSICIAN ORDER COMMUNICATION  CONCERNING: P&T Medication Policy on Herbal Medications  DESCRIPTION:  This patient's order for:  Aloe, Turmeric and Cranberry chewables  has been noted.  This product(s) is classified as an "herbal" or natural product. Due to a lack of definitive safety studies or FDA approval, nonstandard manufacturing practices, plus the potential risk of unknown drug-drug interactions while on inpatient medications, the Pharmacy and Therapeutics Committee does not permit the use of "herbal" or natural products of this type within [facility].   ACTION TAKEN: The pharmacy department is unable to verify this order at this time and your patient has been informed of this safety policy. Please reevaluate patient's clinical condition at discharge and address if the herbal or natural product(s) should be resumed at that time.

## 2021-10-06 NOTE — Plan of Care (Signed)

## 2021-10-07 ENCOUNTER — Ambulatory Visit: Payer: Medicare PPO

## 2021-10-07 LAB — CBC
HCT: 31.1 % — ABNORMAL LOW (ref 39.0–52.0)
Hemoglobin: 10.2 g/dL — ABNORMAL LOW (ref 13.0–17.0)
MCH: 32.9 pg (ref 26.0–34.0)
MCHC: 32.8 g/dL (ref 30.0–36.0)
MCV: 100.3 fL — ABNORMAL HIGH (ref 80.0–100.0)
Platelets: 110 10*3/uL — ABNORMAL LOW (ref 150–400)
RBC: 3.1 MIL/uL — ABNORMAL LOW (ref 4.22–5.81)
RDW: 14.1 % (ref 11.5–15.5)
WBC: 2.3 10*3/uL — ABNORMAL LOW (ref 4.0–10.5)
nRBC: 0 % (ref 0.0–0.2)

## 2021-10-07 LAB — BASIC METABOLIC PANEL
Anion gap: 5 (ref 5–15)
BUN: 34 mg/dL — ABNORMAL HIGH (ref 8–23)
CO2: 30 mmol/L (ref 22–32)
Calcium: 8.1 mg/dL — ABNORMAL LOW (ref 8.9–10.3)
Chloride: 101 mmol/L (ref 98–111)
Creatinine, Ser: 0.86 mg/dL (ref 0.61–1.24)
GFR, Estimated: >60 mL/min (ref >60)
Glucose, Bld: 144 mg/dL — ABNORMAL HIGH (ref 70–99)
Potassium: 4.6 mmol/L (ref 3.5–5.1)
Sodium: 136 mmol/L (ref 135–145)

## 2021-10-07 LAB — IRON AND TIBC
Iron: 34 ug/dL — ABNORMAL LOW (ref 45–182)
Saturation Ratios: 16 % — ABNORMAL LOW (ref 17.9–39.5)
TIBC: 214 ug/dL — ABNORMAL LOW (ref 250–450)
UIBC: 180 ug/dL

## 2021-10-07 LAB — MAGNESIUM: Magnesium: 2.3 mg/dL (ref 1.7–2.4)

## 2021-10-07 LAB — FOLATE: Folate: 41 ng/mL (ref >5.9)

## 2021-10-07 LAB — PHOSPHORUS: Phosphorus: 5.2 mg/dL — ABNORMAL HIGH (ref 2.5–4.6)

## 2021-10-07 MED ORDER — PREDNISONE 20 MG PO TABS: 30.0000 mg | ORAL_TABLET | Freq: Every day | ORAL | Status: DC

## 2021-10-07 MED ORDER — POLYSACCHARIDE IRON COMPLEX 150 MG PO CAPS
150.0000 mg | ORAL_CAPSULE | Freq: Every day | ORAL | Status: DC
  Administered 2021-10-07 – 2021-10-08 (×2): 150 mg via ORAL
  Filled 2021-10-07 (×2): qty 1

## 2021-10-07 MED ORDER — ALPRAZOLAM 0.5 MG PO TABS: 0.5000 mg | ORAL_TABLET | Freq: Every day | ORAL | 0 refills | Status: DC

## 2021-10-07 MED ORDER — POLYSACCHARIDE IRON COMPLEX 150 MG PO CAPS: 150.0000 mg | ORAL_CAPSULE | Freq: Every day | ORAL | 2 refills | Status: DC

## 2021-10-07 MED ORDER — IPRATROPIUM-ALBUTEROL 0.5-2.5 (3) MG/3ML IN SOLN
3.0000 mL | Freq: Three times a day (TID) | RESPIRATORY_TRACT | Status: DC
  Administered 2021-10-07 – 2021-10-08 (×3): 3 mL via RESPIRATORY_TRACT
  Filled 2021-10-07 (×3): qty 3

## 2021-10-07 MED ORDER — PREDNISONE 20 MG PO TABS
40.0000 mg | ORAL_TABLET | Freq: Every day | ORAL | Status: AC
  Administered 2021-10-08: 40 mg via ORAL
  Filled 2021-10-07: qty 2

## 2021-10-07 MED ORDER — PREDNISONE 10 MG PO TABS: 10.0000 mg | ORAL_TABLET | Freq: Every day | ORAL | Status: DC

## 2021-10-07 MED ORDER — ALBUTEROL SULFATE HFA 108 (90 BASE) MCG/ACT IN AERS: 2.0000 | INHALATION_SPRAY | Freq: Four times a day (QID) | RESPIRATORY_TRACT | 2 refills | Status: AC | PRN

## 2021-10-07 MED ORDER — METHYLPREDNISOLONE SODIUM SUCC 40 MG IJ SOLR
40.0000 mg | Freq: Two times a day (BID) | INTRAMUSCULAR | Status: AC
  Administered 2021-10-07: 40 mg via INTRAVENOUS
  Filled 2021-10-07: qty 1

## 2021-10-07 MED ORDER — DM-GUAIFENESIN ER 30-600 MG PO TB12
1.0000 | ORAL_TABLET | Freq: Two times a day (BID) | ORAL | Status: DC | PRN
Start: 2021-10-07 — End: 2021-12-11

## 2021-10-07 MED ORDER — PREDNISONE 10 MG PO TABS: 10.0000 mg | ORAL_TABLET | Freq: Every day | ORAL | 0 refills | Status: DC

## 2021-10-07 MED ORDER — PREDNISONE 20 MG PO TABS: 20.0000 mg | ORAL_TABLET | Freq: Every day | ORAL | Status: DC

## 2021-10-07 NOTE — Consult Note (Signed)
Urology was asked to see this patient for evaluation of urinary leakage around his indwelling Foley catheter.  He has a known history of this and takes Myrbetriq 50 mg daily for management.  Patient reports he continues to have urinary leakage around his Foley consistent with his baseline.  He states if anything his leakage has been slightly improved since his recent admission.  Foley catheter in place today draining clear, yellow urine.  He is due for Foley exchange in clinic with me in 1 week and patient does not wish to pursue this sooner.  I do not recommend pursuing Foley exchange sooner, as it will not resolve this chronic, unchanged issue.  Please continue daily Myrbetriq and use absorbent products to capture draining urine.  He should keep his scheduled outpatient Foley exchange appointment with me in 1 week.  Debroah Loop, PA-C 10/07/21 9:40 AM  This is a no charge consult.

## 2021-10-07 NOTE — NC FL2 (Signed)
[place] LEVEL OF CARE SCREENING TOOL     IDENTIFICATION  Patient Name: [name] Birthdate: December 06, 1933 Sex: male Admission Date (Current Location): 10/05/2021  [facility] and [place] Number:  Engineering geologist and Address:  [facility], [address] [place], [place] [address]      Provider Number: [id number]  Attending Physician Name and Address:  Val Riles, MD  Relative Name and Phone Number:       Current Level of Care: Hospital Recommended Level of Care: [place] Prior Approval Number:    Date Approved/Denied:   PASRR Number: [id number]  Discharge Plan: SNF    Current Diagnoses: Patient Active Problem List   Diagnosis Date Noted   Asthma exacerbation 10/05/2021   PAF (paroxysmal atrial fibrillation) ([place]) 10/05/2021   Acute respiratory failure with hypoxia ([place]) 10/05/2021   Sacral decubitus ulcer, stage II ([place]) 10/05/2021   COVID-19 virus infection 09/19/2021   E coli bacteremia    Acute kidney injury superimposed on CKD ([place]) 09/11/2021   Hypotension    Thrombocytopenia ([place])    Chronic diastolic CHF (congestive heart failure) ([place])    Weakness    Neuropathy    Severe persistent asthma 02/05/2021   Postprocedure pain management follow-up 12/08/2020   Spondylosis without myelopathy or radiculopathy, lumbosacral region 11/24/2020   Elevated C-reactive protein (CRP) 11/24/2020   Elevated sed rate 11/24/2020   Chronic pain syndrome 11/04/2020   Pharmacologic therapy 11/04/2020   Disorder of skeletal system 11/04/2020   Problems influencing health status 11/04/2020   Abnormal MRI, lumbar spine (09/21/2020) 11/04/2020   Chronic low back pain (Bilateral) w/o sciatica 11/04/2020   DDD (degenerative disc disease), lumbosacral 11/04/2020   Lumbosacral foraminal stenosis 11/04/2020   Lumbar facet hypertrophy 11/04/2020   Levoscoliosis of lumbosacral spine 11/04/2020   Grade 1 Retrolisthesis at  L2-3 and L3-4 11/04/2020   Chronic anticoagulation (Eliquis) 11/04/2020   Lumbar facet syndrome 11/04/2020   Long term prescription benzodiazepine use 11/04/2020   Chronic shoulder pain (Bilateral) 11/04/2020   Chronic elbow pain (Bilateral) 11/04/2020   Chronic wrist pain (Bilateral) 11/04/2020   Chronic sinusitis 07/12/2020   Persistent atrial fibrillation ([place]) 07/12/2020   Pancytopenia ([place]) 09/12/2019   Edema of lower extremity 09/12/2019   Hyposmolality and/or hyponatremia 09/12/2019   Renal cell carcinoma ([place]) 07/11/2019   AF (paroxysmal atrial fibrillation) ([place]) 05/09/2019   History of TIA (transient ischemic attack) 12/06/2018   Vision changes 12/06/2018   TIA (transient ischemic attack) 06/06/2018   Carotid artery disease ([place]) 06/06/2018   Frailty 04/24/2018   Long term current use of immunosuppressive drug 04/24/2018   Obesity (BMI 30-39.9) 04/24/2018   Fever 11/02/2017   SIADH (syndrome of inappropriate ADH production) ([place]) 04/17/2017   AKI (acute kidney injury) ([place]) 04/03/2017   Hyponatremia 01/16/2017   Urinary tract infection associated with indwelling urethral catheter ([place]) 12/19/2016   Asthma with acute exacerbation 11/14/2016   Mild intermittent asthma without complication 10/62/6948   Mood disorder ([place]) 01/18/2016   Presence of indwelling urinary catheter 08/20/2015   Advanced directives, counseling/discussion 07/28/2014   Routine general medical examination at a health care facility 04/09/2012   Venous stasis dermatitis 06/13/2011   Sleep disturbance 12/21/2010   CONSTIPATION, CHRONIC 12/07/2010   Chronic constipation 12/07/2010   Hyperlipidemia 12/02/2010   OSTEOARTHRITIS 12/02/2010   Neurogenic bladder 05/24/2010   IBS 05/05/2010   History of colonic polyps 05/05/2010   NEUROPATHY 05/03/2010   Mononeuritis 05/03/2010   Personal history of  prostate cancer 03/04/2010   Benign essential hypertension 03/25/2009   GAD (generalized anxiety disorder)  01/27/2007   Coronary atherosclerosis of native coronary artery 01/27/2007   Allergic asthma 01/27/2007   GERD 01/27/2007   Chronic rheumatic arthritis ([place]) 01/27/2007   Rheumatoid arthritis involving multiple sites with positive rheumatoid factor ([place]) 01/27/2007    Orientation RESPIRATION BLADDER Height & Weight     Self, Time, Place, Situation  O2 (2L) Continent Weight: 279 lb (126.6 kg) Height:  5\' 7"  (170.2 cm)  BEHAVIORAL SYMPTOMS/MOOD NEUROLOGICAL BOWEL NUTRITION STATUS      Continent Diet (heart healthy, thin liquids)  AMBULATORY STATUS COMMUNICATION OF NEEDS Skin     Verbally PU Stage and Appropriate Care     PU Stage 3 Dressing:  (mid buttocks, foam dressing, change every 3 days)                 Personal Care Assistance Level of Assistance  Bathing, Feeding, Dressing           Functional Limitations Info  Sight, Hearing, Speech Sight Info: Adequate Hearing Info: Adequate Speech Info: Adequate    SPECIAL CARE FACTORS FREQUENCY  PT (By licensed PT), OT (By licensed OT)     PT Frequency: 5x OT Frequency: 5x            Contractures Contractures Info: Not present    Additional Factors Info  Code Status, Allergies, Isolation Precautions Code Status Info: full code Allergies Info: Chlorhexidine, Ciprofloxacin, Citalopram Hydrobromide, Clindamycin, Lorazepam, Paroxetine, Ramipril, Simvastatin, Sulfa Antibiotics     Isolation Precautions Info: COVID + 09/17/21     Current Medications (10/07/2021):  This is the current hospital active medication list Current Facility-Administered Medications  Medication Dose Route Frequency Provider Last Rate Last Admin   acetaminophen (TYLENOL) tablet 650 mg  650 mg Oral Q6H PRN Ivor Costa, MD   650 mg at 10/06/21 0245   albuterol (PROVENTIL) (2.5 MG/3ML) 0.083% nebulizer solution 2.5 mg  2.5 mg Nebulization Q4H PRN Ivor Costa, MD       ALPRAZolam Duanne Moron) tablet 0.5 mg  0.5 mg Oral QHS Ivor Costa, MD   0.5 mg at  10/06/21 2243   apixaban (ELIQUIS) tablet 5 mg  5 mg Oral BID Ivor Costa, MD   5 mg at 10/07/21 4132   ascorbic acid (VITAMIN C) tablet 1,000 mg  1,000 mg Oral Daily Ivor Costa, MD   1,000 mg at 10/07/21 4401   cholecalciferol (VITAMIN D3) tablet 5,000 Units  5,000 Units Oral Daily Ivor Costa, MD   5,000 Units at 10/07/21 0855   dextromethorphan-guaiFENesin ([place] DM) 30-600 MG per 12 hr tablet 1 tablet  1 tablet Oral BID PRN Ivor Costa, MD   1 tablet at 10/06/21 1009   diphenhydrAMINE (BENADRYL) capsule 25 mg  25 mg Oral Q6H PRN Ivor Costa, MD       fluticasone (FLONASE) 50 MCG/ACT nasal spray 2 spray  2 spray Each Nare BID Ivor Costa, MD   2 spray at 02/72/53 6644   folic acid (FOLVITE) tablet 1.5 mg  1,500 mcg Oral Daily Ivor Costa, MD   1.5 mg at 10/07/21 0856   gabapentin (NEURONTIN) tablet 600 mg  600 mg Oral BID AC & HS Ivor Costa, MD   600 mg at 10/07/21 0347   hydrALAZINE (APRESOLINE) injection 5 mg  5 mg Intravenous Q2H PRN Ivor Costa, MD       hydroxychloroquine (PLAQUENIL) tablet 200 mg  200 mg Oral BID Ivor Costa, MD  200 mg at 10/07/21 0857   ipratropium-albuterol (DUONEB) 0.5-2.5 (3) MG/3ML nebulizer solution 3 mL  3 mL Nebulization Q4H Ivor Costa, MD   3 mL at 10/07/21 0747   ketoconazole (NIZORAL) 2 % cream 1 application  1 application Topical BID Ivor Costa, MD   1 application at 60/45/40 2251   loratadine (CLARITIN) tablet 10 mg  10 mg Oral Daily Ivor Costa, MD   10 mg at 10/07/21 0855   methylPREDNISolone sodium succinate (SOLU-MEDROL) 40 mg/mL injection 40 mg  40 mg Intravenous Q12H Val Riles, MD   40 mg at 10/07/21 0858   metoprolol succinate (TOPROL-XL) 24 hr tablet 12.5 mg  12.5 mg Oral QHS Ivor Costa, MD   12.5 mg at 10/06/21 2243   mirabegron ER (MYRBETRIQ) tablet 50 mg  50 mg Oral Daily Ivor Costa, MD   50 mg at 10/07/21 0857   omega-3 acid ethyl esters (LOVAZA) capsule 1 g  1 g Oral Daily Ivor Costa, MD   1 g at 10/07/21 0856   pantoprazole (PROTONIX) EC tablet 40  mg  40 mg Oral Daily PRN Ivor Costa, MD       polyethylene glycol (MIRALAX / GLYCOLAX) packet 17 g  17 g Oral QHS Ivor Costa, MD   17 g at 10/06/21 2243   pravastatin (PRAVACHOL) tablet 20 mg  20 mg Oral Daily Ivor Costa, MD   20 mg at 10/07/21 0856   traZODone (DESYREL) tablet 50 mg  50 mg Oral QHS Ivor Costa, MD   50 mg at 10/06/21 2243   vitamin B-12 (CYANOCOBALAMIN) tablet 1,000 mcg  1,000 mcg Oral Daily Ivor Costa, MD   1,000 mcg at 10/07/21 9811     Discharge Medications: Please see discharge summary for a list of discharge medications.  Relevant Imaging Results:  Relevant Lab Results:   Additional Information [id number]  Gerrianne Scale Fidencio Duddy, LCSW

## 2021-10-07 NOTE — TOC Progression Note (Signed)
Transition of Care [facility]) - Progression Note    Patient Details  Name: [name] MRN: [id number] Date of Birth: 09-20-1934  [facility]) CM/SW Contact  Eileen Stanford, LCSW Phone Number: 10/07/2021, 2:36 PM  Clinical Narrative:   CSW provided one bed offer to daughter and at this time she says to proceed with getting auth for [facility]. CSW has started [place].    Expected Discharge Plan: Skilled Nursing Facility Barriers to Discharge: Continued Medical Work up  Expected Discharge Plan and Services Expected Discharge Plan: [place] In-house Referral: NA   Post Acute Care Choice: [place] Living arrangements for the past 2 months: [place]                                       Social Determinants of Health (SDOH) Interventions    Readmission Risk Interventions Readmission Risk Prevention Plan 09/12/2021 06/23/2019  Transportation Screening Complete Complete  PCP or Specialist Appt within 3-5 Days - Complete  HRI or [place] - Complete  Social Work Consult for [place] Planning/Counseling - Complete  Palliative Care Screening - Not Applicable  Medication Review Press photographer) Complete Complete  PCP or Specialist appointment within 3-5 days of discharge Complete -  [place] or Home Care Consult Complete -  SW Recovery Care/Counseling Consult Complete -  Palliative Care Screening Not Applicable -  Skilled Nursing Facility Complete -  Some recent data might be hidden

## 2021-10-07 NOTE — TOC Progression Note (Signed)
Transition of Care [facility]) - Progression Note    Patient Details  Name: [name] MRN: [id number] Date of Birth: January 15, 1934  Transition of Care [facility]) CM/SW [place], LCSW Phone Number: 10/07/2021, 4:26 PM  Clinical Narrative:   CSW spoke with Destiny at [facility]. RN confirmed pt is in bariatric bed with air matress. Pt will need a bariatric bed and air mattress at SNF. Destiny states the supply rep is gone for the day and they could take pt in the morning once they are able to get the bed and mattress. MD notified.    Expected Discharge Plan: [place] Barriers to Discharge: Continued Medical Work up  Expected Discharge Plan and Services Expected Discharge Plan: [place] In-house Referral: NA   Post Acute Care Choice: [place] Living arrangements for the past 2 months: [place] Expected Discharge Date: 10/07/21                                     Social Determinants of Health (SDOH) Interventions    Readmission Risk Interventions Readmission Risk Prevention Plan 09/12/2021 06/23/2019  Transportation Screening Complete Complete  PCP or Specialist Appt within 3-5 Days - Complete  HRI or [place] - Complete  Social Work Consult for [place] Planning/Counseling - Complete  Palliative Care Screening - Not Applicable  Medication Review Press photographer) Complete Complete  PCP or Specialist appointment within 3-5 days of discharge Complete -  [place] or Home Care Consult Complete -  SW Recovery Care/Counseling Consult Complete -  Palliative Care Screening Not Applicable -  Skilled Nursing Facility Complete -  Some recent data might be hidden

## 2021-10-07 NOTE — Progress Notes (Addendum)
[facility] Progress Note  Patient: [name]    BWL:[id number]  DOA: 10/05/2021     Date of Service: the patient was seen and examined on 10/07/2021  Chief Complaint  Patient presents with   Cough   Shortness of Breath   Brief hospital course: [name] is a 87 y.o. male with medical history significant of hypertension, hyperlipidemia, asthma, TIA, GERD, depression, anxiety, rheumatoid arthritis, renal cell carcinoma (s/p for right nephrectomy), pancytopenia, thrombocytopenia, PAF on Eliquis, OSA not on CPAP, IBS, SIADH, diverticulitis, dCHF, CAD, neurogenic bladder, indwelling Foley catheter placement, who presents with shortness of breath.   Patient was recently hospitalized on 10/15 due to E. coli bacteremia secondary to UTI.  During that admission, patient was incidentally found to have positive COVID on 10/21.  Patient was asymptomatic at that time.  He was isolated for 10 days and discharged to nursing home.    Patient states that he has shortness of breath for several days, which has been progressively worsening.  Patient has productive cough with thick colorless mucus production. No CP, fever or chills.  Patient does not have nausea vomiting, diarrhea or abdominal pain.  No symptoms of UTI.  Pt has poor appetite and generalized weakness. He states that his Foley catheter has been working normally.  Patient states that he has been receiving Xolari injection with good control of his asthma, but since last dose on 08/28/2021, he did not receive injection in facility.   Patient is normally not using oxygen, but was found to have oxygen desaturating to 85-88% on room air.  Currently on 2 L oxygen with 94-98% saturation.   ED Course: pt was found to have pancytopenia with WBC 3.5, hemoglobin 10.3, platelets 91, troponin level 17, electrolytes renal function okay, GFR > 60, temperature normal, blood pressure 142/75, heart rate 81, RR 27, chest x-ray showed cardiomegaly and negative  for infiltration.  Patient is admitted to progressive bed as inpatient.    Assessment and Plan: Principal Problem:   Asthma exacerbation Active Problems:   Benign essential hypertension   Coronary atherosclerosis of native coronary artery   Neurogenic bladder   Chronic rheumatic arthritis (HCC)   Hyperlipidemia   Presence of indwelling urinary catheter   Pancytopenia (HCC)   History of TIA (transient ischemic attack)   Chronic diastolic CHF (congestive heart failure) (HCC)   COVID-19 virus infection   PAF (paroxysmal atrial fibrillation) (HCC)   Acute respiratory failure with hypoxia (HCC)   Sacral decubitus ulcer, stage II (HCC)   Acute respiratory failure with hypoxia due to asthma exacerbation: Patient has cough, shortness of breath, wheezing on  lung auscultation, clinically consistent with asthma exacerbation.  Patient has new 2 L oxygen requirement.  Chest x-ray is negative for infiltration.  -continue Bronchodilators -s/p Solu-Medrol 120 mg daily, changed to 40 mg IV every 12 hourly on 11/9 Continue Claritin 10 mg p.o. daily -Mucinex for cough  - RVP negative  -Nasal cannula oxygen as needed to maintain O2 saturation 93% or greater 11/10 start prednisone tapering dose tomorrow a.m.   Benign essential hypertension -IV hydralazine as needed -Metoprolol   Coronary atherosclerosis of native coronary artery -Pravastatin, metoprolol   Neurogenic bladder: S/p of Foley cath, Foley catheter was changed on 09/13/2021 -Myrbetriq 11/9 stated that Foley catheter is leaking, consulted urology for further evaluation   Chronic rheumatic arthritis ([place]) -Plaquenil   Hyperlipidemia -Pravastatin   Pancytopenia ([place]): Hemoglobin stable.  9.0 on 09/22/2021,  hemoglobin 10.7 today   History  of TIA (transient ischemic attack) -On Eliquis for A. Fib -Pravastatin   Chronic diastolic CHF (congestive heart failure) ([place]): 2D echo on 04/28/2020 showed EF CT density 5% with grade 2  diastolic dysfunction.  Patient does not have edema, JVD, pulm edema on chest x-ray, does not seem to have CHF exacerbation.  But BNP is elevated 216, patient is at risk of developing CHF exacerbation. - s/p 1 dose of IV Lasix 40 mg x 1    COVID-19 virus infection: Patient was found incidentally COVID-positive on 10/21, that time patient was asymptomatic. Now is beyond the window for treatment. -Bronchodilators as above -No isolation needed   PAF (paroxysmal atrial fibrillation) (HCC) -Continue Eliquis, metoprolol   Sacral decubitus ulcer, stage II: -wound care consult Turn patient every 2 hourly, may use waffle cushion   Body mass index is 35.25 kg/m.  Interventions:   Pressure Injury 10/05/21 Buttocks Mid Stage 1 -  Intact skin with non-blanchable redness of a localized area usually over a bony prominence. (Active)  10/05/21 1730  Location: Buttocks  Location Orientation: Mid  Staging: Stage 1 -  Intact skin with non-blanchable redness of a localized area usually over a bony prominence.  Wound Description (Comments):   Present on Admission: Yes     Diet: Heart healthy DVT Prophylaxis: Therapeutic Anticoagulation with Eliquis    Advance goals of care discussion: Full code  Family Communication: family was not present at bedside, at the time of interview.  The pt provided permission to discuss medical plan with the family. Opportunity was given to ask question and all questions were answered satisfactorily.   Disposition:  Pt is from SNF, admitted with SOb, still has respiratory failure, which precludes a safe discharge. Discharge to SNF, when bed is available.  Clinically stable, regular optimize to discharge SNF, we will gradually wean of oxygen    Subjective: No significant overnight events, patient is still having some shortness of breath, using supplemental O2 admission on 1-2 L when nasal cannula, mild productive cough, no any other active issues.   Physical  Exam: General:  alert oriented to time, place, and person.  Appear in no distress, affect appropriate Eyes: PERRLA ENT: Oral Mucosa Clear, moist  Neck: no JVD,  Cardiovascular: S1 and S2 Present, no Murmur,  Respiratory: good respiratory effort, Bilateral Air entry equal and Decreased, mild Crackles, mild wheezes Abdomen: Bowel Sound present, Soft and no tenderness,  Skin: no rashes Extremities: no Pedal edema, no calf tenderness Neurologic: without any new focal findings Gait not checked due to patient safety concerns  Vitals:   10/07/21 0750 10/07/21 0820 10/07/21 1138 10/07/21 1142  BP:  130/69 121/89   Pulse:  79 67   Resp:  19 18   Temp:  97.9 F (36.6 C) 97.8 F (36.6 C)   TempSrc:      SpO2: 97% 97% 93% 98%  Weight:      Height:        Intake/Output Summary (Last 24 hours) at 10/07/2021 1428 Last data filed at 10/07/2021 1000 Gross per 24 hour  Intake --  Output 520 ml  Net -520 ml   Filed Weights   10/05/21 1643 10/07/21 0347  Weight: 102.1 kg 126.6 kg    Data Reviewed: I have personally reviewed and interpreted daily labs, tele strips, imagings as discussed above. I reviewed all nursing notes, pharmacy notes, vitals, pertinent old records I have discussed plan of care as described above with RN and patient/family.  CBC: Recent Labs  Lab 10/05/21 1254 10/06/21 0418 10/06/21 1054 10/07/21 0423  WBC 3.5* 1.1* 2.6* 2.3*  NEUTROABS 1.7  --   --   --   HGB 10.3* 10.2* 10.7* 10.2*  HCT 31.1* 29.9* 31.5* 31.1*  MCV 101.3* 98.7 96.9 100.3*  PLT 91* 90* 110* 601*   Basic Metabolic Panel: Recent Labs  Lab 10/05/21 1254 10/06/21 1054 10/07/21 0423  NA 134* 132* 136  K 5.0 4.0 4.6  CL 98 96* 101  CO2 30 26 30   GLUCOSE 96 142* 144*  BUN 18 29* 34*  CREATININE 0.89 0.99 0.86  CALCIUM 8.2* 8.4* 8.1*  MG  --  1.9 2.3  PHOS  --  3.7 5.2*    Studies: No results found.  Scheduled Meds:  ALPRAZolam  0.5 mg Oral QHS   apixaban  5 mg Oral BID    vitamin C  1,000 mg Oral Daily   cholecalciferol  5,000 Units Oral Daily   fluticasone  2 spray Each Nare BID   folic acid  0,932 mcg Oral Daily   gabapentin  600 mg Oral BID AC & HS   hydroxychloroquine  200 mg Oral BID   ipratropium-albuterol  3 mL Nebulization Q4H   iron polysaccharides  150 mg Oral Daily   ketoconazole  1 application Topical BID   loratadine  10 mg Oral Daily   methylPREDNISolone (SOLU-MEDROL) injection  40 mg Intravenous Q12H   metoprolol succinate  12.5 mg Oral QHS   mirabegron ER  50 mg Oral Daily   omega-3 acid ethyl esters  1 g Oral Daily   polyethylene glycol  17 g Oral QHS   pravastatin  20 mg Oral Daily   traZODone  50 mg Oral QHS   vitamin B-12  1,000 mcg Oral Daily   Continuous Infusions: PRN Meds: acetaminophen, albuterol, dextromethorphan-guaiFENesin, diphenhydrAMINE, hydrALAZINE, pantoprazole  Time spent: 35 minutes  Author: Val Riles. MD [facility] 10/07/2021 2:28 PM  To reach On-call, see care teams to locate the attending and reach out to them via [email or web address]. If 7PM-7AM, please contact night-coverage If you still have difficulty reaching the attending provider, please page the [facility] (Director on Call) for [facility] on amion for assistance.

## 2021-10-07 NOTE — Evaluation (Signed)
Occupational Therapy Evaluation Patient Details Name: [name] MRN: [id number] DOB: 1934/08/06 Today's Date: 10/07/2021   History of Present Illness [name]  is a 87 y.o. male with a known history of chronic diastolic congestive heart failure, indwelling Foley catheter, rheumatoid arthritis, chronic hyponatremia, chronic kidney disease. He has been exhausted lately and having trouble sleeping, and has been weak and struggling to get up and down. In the ER he was hypotensive with a blood pressure of 94/37 on 1 occasion.  He was dehydrated with a BUN of 36 and a creatinine up to 2.28. Found to be COVID + 09/17/21.   Clinical Impression   [name] was seen for OT evaluation this date. Prior to hospital admission, pt was recently discharged to [place]. Pt lives with family available 24/7 in 2 level home. Prior to STR pt completed household mobility c RW. Pt presents to acute OT demonstrating impaired ADL performance and functional mobility 2/2 decreased activity tolerance, functional strength/ROM/balance deficits. Pt currently requires MAX A don B socks at bed level. MOD A for bed mobility. Initiated standing to change wet chux pad however unable to adequately anterior weight shift, required MAX A for ~2 inch clearance from bed. Pt completed seated therex as described below. Pt would benefit from skilled OT to address noted impairments and functional limitations (see below for any additional details) in order to maximize safety and independence while minimizing falls risk and caregiver burden. Upon hospital discharge, recommend STR to maximize pt safety and return to PLOF.       Recommendations for follow up therapy are one component of a multi-disciplinary discharge planning process, led by the attending physician.  Recommendations may be updated based on patient status, additional functional criteria and insurance authorization.   Follow Up Recommendations  Skilled nursing-short term rehab (<3  hours/day)    Assistance Recommended at Discharge Frequent or constant Supervision/Assistance  Functional Status Assessment  Patient has had a recent decline in their functional status and demonstrates the ability to make significant improvements in function in a reasonable and predictable amount of time.  Equipment Recommendations  Other (comment) (defer to next venue of care)    Recommendations for Other Services       Precautions / Restrictions Precautions Precautions: Fall Restrictions Weight Bearing Restrictions: No      Mobility Bed Mobility Overal bed mobility: Needs Assistance Bed Mobility: Supine to Sit;Sit to Supine Supine to sit: Mod assist Sit to supine: Mod assist      Transfers Overall transfer level: Needs assistance Equipment used: Rolling walker (2 wheels) Transfers: Sit to/from Stand Sit to Stand: Max assist          Lateral/Scoot Transfers: Max assist General transfer comment: difficulty sequencing anterior weight shift to fully engage BLE in transfers      Balance Overall balance assessment: Needs assistance Sitting-balance support: No upper extremity supported Sitting balance-[name]: Good Sitting balance - Comments: steady sitting reaching within BOS   Standing balance support: Bilateral upper extremity supported Standing balance-[name]: Poor Standing balance comment: heavy assist with U&LE weakness, posterior lean and quick to fatigue                           ADL either performed or assessed with clinical judgement   ADL Overall ADL's : Needs assistance/impaired  General ADL Comments: MAX A don B socks at bed level. MAX A for simulated toilet t/f      Pertinent Vitals/Pain Pain Assessment: Faces Faces Pain Scale: Hurts little more Pain Location: chronic arthritic pain t/o Pain Descriptors / Indicators: Aching Pain Intervention(s): Repositioned     Hand  Dominance Right   Extremity/Trunk Assessment Upper Extremity Assessment Upper Extremity Assessment: RUE deficits/detail;LUE deficits/detail;Generalized weakness RUE Deficits / Details: Shoulder flexion ~30* LUE Deficits / Details: Shoulder flexion ~30*   Lower Extremity Assessment Lower Extremity Assessment: Generalized weakness       Communication Communication Communication: No difficulties;HOH   Cognition Arousal/Alertness: Awake/alert Behavior During Therapy: WFL for tasks assessed/performed Overall Cognitive Status: Within Functional Limits for tasks assessed                                 General Comments: difficulty sequencing anterior weight shift for transfers        Exercises Exercises: Other exercises Other Exercises Other Exercises: Pt educated re: OT role, DME recs, d/c recs, falls prevention, wound prevention Other Exercises: LBD, sup<>sit, sit<>stand, sitting/standing balance/tolerance, lateral scoot Other Exercises: seated therex BUE 10 reps each overhead press, chest press, BLE x10 reps each seated marches, IS x10   Shoulder Instructions      Home Living Family/patient expects to be discharged to:: Skilled nursing facility Living Arrangements: Spouse/significant other Available Help at Discharge: Family;Available 24 hours/day Type of Home: House Home Access: Stairs to enter [employer] of Steps: 3 Entrance Stairs-Rails: None Home Layout: Two level;Able to live on main level with bedroom/bathroom               Home Equipment: Rolling Walker (2 wheels)   Additional Comments: Pt reports that he has a lift to get up the stairs to enter the home      Prior Functioning/Environment Prior Level of Function : Other (comment)             Mobility Comments: Pt reports that ~1 month ago he was able, with walker, to ambulate in the home and doing limited community mobility ADLs Comments: recently at [facility] requiring assist for  ADLs        OT Problem List: Decreased strength;Decreased range of motion;Decreased activity tolerance;Impaired balance (sitting and/or standing);Decreased safety awareness;Pain;Impaired UE functional use      OT Treatment/Interventions: Self-care/ADL training;Therapeutic exercise;Energy conservation;DME and/or AE instruction;Therapeutic activities;Patient/family education;Balance training    OT Goals(Current goals can be found in the care plan section) Acute Rehab OT Goals Patient Stated Goal: to improve mobility OT Goal Formulation: With patient Time For Goal Achievement: 10/21/21 Potential to Achieve Goals: Fair ADL Goals Pt Will Perform Grooming: with set-up;with supervision;sitting (tolerate >10 min sitting grooming) Pt Will Transfer to Toilet: with mod assist;squat pivot transfer;bedside commode (c LRAD PRN) Pt Will Perform Toileting - Clothing Manipulation and hygiene: with mod assist;sitting/lateral leans  OT Frequency: Min 2X/week    AM-PAC OT "6 Clicks" Daily Activity     Outcome Measure Help from another person eating meals?: A Little Help from another person taking care of personal grooming?: A Lot Help from another person toileting, which includes using toliet, bedpan, or urinal?: A Lot Help from another person bathing (including washing, rinsing, drying)?: A Lot Help from another person to put on and taking off regular upper body clothing?: A Lot Help from another person to put on and taking off regular lower body clothing?: A Lot  6 Click Score: 13   End of Session Nurse Communication: Mobility status  Activity Tolerance: Patient tolerated treatment well Patient left: in bed;with call bell/phone within reach  OT Visit Diagnosis: Muscle weakness (generalized) (M62.81);Unsteadiness on feet (R26.81)                Time: 0383-3383 OT Time Calculation (min): 26 min Charges:  OT General Charges $OT Visit: 1 Visit OT Evaluation $OT Eval Low Complexity: 1 Low OT  Treatments $Therapeutic Exercise: 8-22 mins  Dessie Coma, M.S. OTR/L  10/07/21, 2:57 PM  ascom [phone]

## 2021-10-07 NOTE — TOC Initial Note (Signed)
Transition of Care [facility]) - Initial/Assessment Note    Patient Details  Name: [name] MRN: [id number] Date of Birth: Sep 28, 1934  Transition of Care [facility]) CM/SW Contact:    Eileen Stanford, LCSW Phone Number: 10/07/2021, 9:42 AM  Clinical Narrative:      CSW spoke with pt's daughter. Pt's daughter ask that CSW do a search to see which SNF's have a bed and can take pt. Pt is from [place] however, has had a poor experience there. Pt does not want to go to [facility]. CSW has sent referral to several facilities in [place] and [place]- per daughters permission. CSW will need bed offers, choice, and auth prior to pt dc.             Expected Discharge Plan: Skilled Nursing Facility Barriers to Discharge: Continued Medical Work up   Patient Goals and CMS Choice Patient states their goals for this hospitalization and ongoing recovery are:: to go to snf   Choice offered to / list presented to : Patient, Adult Children  Expected Discharge Plan and Services Expected Discharge Plan: [place] In-house Referral: NA   Post Acute Care Choice: [place] Living arrangements for the past 2 months: [place]                                      Prior Living Arrangements/Services Living arrangements for the past 2 months: [place] Lives with:: Facility Resident Patient language and need for interpreter reviewed:: Yes Do you feel safe going back to the place where you live?: Yes      Need for Family Participation in Patient Care: Yes (Comment) Care giver support system in place?: Yes (comment)   Criminal Activity/Legal Involvement Pertinent to Current Situation/Hospitalization: No - Comment as needed  Activities of Daily Living Home Assistive Devices/Equipment: Walker (specify type) ADL Screening (condition at time of admission) Patient's cognitive ability adequate to safely complete daily activities?: Yes Is the  patient deaf or have difficulty hearing?: Yes Does the patient have difficulty seeing, even when wearing glasses/contacts?: No Does the patient have difficulty concentrating, remembering, or making decisions?: No Patient able to express need for assistance with ADLs?: Yes Does the patient have difficulty dressing or bathing?: Yes Independently performs ADLs?: No Communication: Independent Dressing (OT): Needs assistance Is this a change from baseline?: Change from baseline, expected to last >3 days Grooming: Needs assistance Is this a change from baseline?: Change from baseline, expected to last >3 days Feeding: Independent Bathing: Dependent Is this a change from baseline?: Change from baseline, expected to last >3 days Toileting: Dependent Is this a change from baseline?: Pre-admission baseline In/Out Bed: Dependent Is this a change from baseline?: Change from baseline, expected to last >3 days Walks in Home: Needs assistance Is this a change from baseline?: Change from baseline, expected to last >3 days Does the patient have difficulty walking or climbing stairs?: Yes Weakness of Legs: Both Weakness of Arms/Hands: Both  Permission Sought/Granted Permission sought to share information with : Family Supports Permission granted to share information with : Yes, Release of Information Signed  Share Information with NAME: [name]     Permission granted to share info w Relationship: daughter     Emotional Assessment Appearance:: Appears stated age     Orientation: : Oriented to Self, Oriented to Place, Oriented to  Time, Oriented to Situation Alcohol /  Substance Use: Not Applicable Psych Involvement: No (comment)  Admission diagnosis:  Shortness of breath [R06.02] Acute respiratory failure with hypoxia (HCC) [J96.01] Asthma exacerbation [J45.901] Exacerbation of asthma, unspecified asthma severity, unspecified whether persistent [J45.901] Patient Active Problem List    Diagnosis Date Noted   Asthma exacerbation 10/05/2021   PAF (paroxysmal atrial fibrillation) ([place]) 10/05/2021   Acute respiratory failure with hypoxia ([place]) 10/05/2021   Sacral decubitus ulcer, stage II ([place]) 10/05/2021   COVID-19 virus infection 09/19/2021   E coli bacteremia    Acute kidney injury superimposed on CKD ([place]) 09/11/2021   Hypotension    Thrombocytopenia (HCC)    Chronic diastolic CHF (congestive heart failure) ([place])    Weakness    Neuropathy    Severe persistent asthma 02/05/2021   Postprocedure pain management follow-up 12/08/2020   Spondylosis without myelopathy or radiculopathy, lumbosacral region 11/24/2020   Elevated C-reactive protein (CRP) 11/24/2020   Elevated sed rate 11/24/2020   Chronic pain syndrome 11/04/2020   Pharmacologic therapy 11/04/2020   Disorder of skeletal system 11/04/2020   Problems influencing health status 11/04/2020   Abnormal MRI, lumbar spine (09/21/2020) 11/04/2020   Chronic low back pain (Bilateral) w/o sciatica 11/04/2020   DDD (degenerative disc disease), lumbosacral 11/04/2020   Lumbosacral foraminal stenosis 11/04/2020   Lumbar facet hypertrophy 11/04/2020   Levoscoliosis of lumbosacral spine 11/04/2020   Grade 1 Retrolisthesis at L2-3 and L3-4 11/04/2020   Chronic anticoagulation (Eliquis) 11/04/2020   Lumbar facet syndrome 11/04/2020   Long term prescription benzodiazepine use 11/04/2020   Chronic shoulder pain (Bilateral) 11/04/2020   Chronic elbow pain (Bilateral) 11/04/2020   Chronic wrist pain (Bilateral) 11/04/2020   Chronic sinusitis 07/12/2020   Persistent atrial fibrillation ([place]) 07/12/2020   Pancytopenia ([place]) 09/12/2019   Edema of lower extremity 09/12/2019   Hyposmolality and/or hyponatremia 09/12/2019   Renal cell carcinoma ([place]) 07/11/2019   AF (paroxysmal atrial fibrillation) ([place]) 05/09/2019   History of TIA (transient ischemic attack) 12/06/2018   Vision changes 12/06/2018   TIA (transient ischemic  attack) 06/06/2018   Carotid artery disease ([place]) 06/06/2018   Frailty 04/24/2018   Long term current use of immunosuppressive drug 04/24/2018   Obesity (BMI 30-39.9) 04/24/2018   Fever 11/02/2017   SIADH (syndrome of inappropriate ADH production) ([place]) 04/17/2017   AKI (acute kidney injury) ([place]) 04/03/2017   Hyponatremia 01/16/2017   Urinary tract infection associated with indwelling urethral catheter ([place]) 12/19/2016   Asthma with acute exacerbation 11/14/2016   Mild intermittent asthma without complication 09/73/5329   Mood disorder ([place]) 01/18/2016   Presence of indwelling urinary catheter 08/20/2015   Advanced directives, counseling/discussion 07/28/2014   Routine general medical examination at a health care facility 04/09/2012   Venous stasis dermatitis 06/13/2011   Sleep disturbance 12/21/2010   CONSTIPATION, CHRONIC 12/07/2010   Chronic constipation 12/07/2010   Hyperlipidemia 12/02/2010   OSTEOARTHRITIS 12/02/2010   Neurogenic bladder 05/24/2010   IBS 05/05/2010   History of colonic polyps 05/05/2010   NEUROPATHY 05/03/2010   Mononeuritis 05/03/2010   Personal history of prostate cancer 03/04/2010   Benign essential hypertension 03/25/2009   GAD (generalized anxiety disorder) 01/27/2007   Coronary atherosclerosis of native coronary artery 01/27/2007   Allergic asthma 01/27/2007   GERD 01/27/2007   Chronic rheumatic arthritis ([place]) 01/27/2007   Rheumatoid arthritis involving multiple sites with positive rheumatoid factor ([place]) 01/27/2007   PCP:  [name], MD Pharmacy:   [facility], [place] - [address] AT [address] OF  [place] [address] Phone: [phone] Fax: [phone]     Social Determinants of Health (SDOH) Interventions    Readmission Risk Interventions Readmission Risk Prevention Plan 09/12/2021 06/23/2019  Transportation Screening Complete Complete  PCP or  Specialist Appt within 3-5 Days - Complete  HRI or [place] - Complete  Social Work Consult for [place] Planning/Counseling - Complete  Palliative Care Screening - Not Applicable  Medication Review Press photographer) Complete Complete  PCP or Specialist appointment within 3-5 days of discharge Complete -  [place] or Home Care Consult Complete -  SW Recovery Care/Counseling Consult Complete -  Palliative Care Screening Not Applicable -  [place] Complete -  Some recent data might be hidden

## 2021-10-07 NOTE — Discharge Summary (Addendum)
[facility] Discharge Summary   Patient: [name]:[id number]  PCP: [name], MD  Date of admission: 10/05/2021   Date of discharge:  10/08/2021     Discharge Diagnoses:  Principal Problem:   Asthma exacerbation Active Problems:   Benign essential hypertension   Coronary atherosclerosis of native coronary artery   Neurogenic bladder   Chronic rheumatic arthritis ([place])   Hyperlipidemia   Presence of indwelling urinary catheter   Pancytopenia ([place])   History of TIA (transient ischemic attack)   Chronic diastolic CHF (congestive heart failure) ([place])   COVID-19 virus infection   PAF (paroxysmal atrial fibrillation) ([place])   Acute respiratory failure with hypoxia ([place])   Sacral decubitus ulcer, stage II ([place])   Admitted From: SNF Disposition:  SNF   Recommendations for Outpatient Follow-up:  PCP: in 1 wk  Urology in 1-2 wks  Follow up LABS/TEST: Exchange Foley catheter   Contact information for after-discharge care     Destination     [facility] Preferred SNF .   Service: Skilled Nursing Contact information: [address] [place] [phone]                    Diet recommendation: Cardiac diet  Activity: The patient is advised to gradually reintroduce usual activities, as tolerated  Discharge Condition: stable  Code Status: Full code   History of present illness: As per the H and P dictated on admission Hospital Course:  [name] is a 87 y.o. male with medical history significant of hypertension, hyperlipidemia, asthma, TIA, GERD, depression, anxiety, rheumatoid arthritis, renal cell carcinoma (s/p for right nephrectomy), pancytopenia, thrombocytopenia, PAF on Eliquis, OSA not on CPAP, IBS, SIADH, diverticulitis, dCHF, CAD, neurogenic bladder, indwelling Foley catheter placement, who presents with shortness of breath.   Patient was recently hospitalized on 10/15 due to E. coli bacteremia  secondary to UTI.  During that admission, patient was incidentally found to have positive COVID on 10/21.  Patient was asymptomatic at that time.  He was isolated for 10 days and discharged to nursing home.    Patient states that he has shortness of breath for several days, which has been progressively worsening.  Patient has productive cough with thick colorless mucus production. No CP, fever or chills.  Patient does not have nausea vomiting, diarrhea or abdominal pain.  No symptoms of UTI.  Pt has poor appetite and generalized weakness. He states that his Foley catheter has been working normally.  Patient states that he has been receiving Xolari injection with good control of his asthma, but since last dose on 08/28/2021, he did not receive injection in facility.   Patient is normally not using oxygen, but was found to have oxygen desaturating to 85-88% on room air.  Currently on 2 L oxygen with 94-98% saturation.   ED Course: pt was found to have pancytopenia with WBC 3.5, hemoglobin 10.3, platelets 91, troponin level 17, electrolytes renal function okay, GFR > 60, temperature normal, blood pressure 142/75, heart rate 81, RR 27, chest x-ray showed cardiomegaly and negative for infiltration.  Patient is admitted to progressive bed as inpatient.    Assessment and Plan: Principal Problem:   Asthma exacerbation Active Problems:   Benign essential hypertension   Coronary atherosclerosis of native coronary artery   Neurogenic bladder   Chronic rheumatic arthritis (HCC)   Hyperlipidemia   Presence of indwelling urinary catheter   Pancytopenia (HCC)   History of TIA (transient ischemic  attack)   Chronic diastolic CHF (congestive heart failure) (HCC)   COVID-19 virus infection   PAF (paroxysmal atrial fibrillation) (HCC)   Acute respiratory failure with hypoxia (HCC)   Sacral decubitus ulcer, stage II (HCC)   Acute respiratory failure with hypoxia due to asthma exacerbation: Patient has cough,  shortness of breath, wheezing on  lung auscultation, clinically consistent with asthma exacerbation.  s/p oxygen, which has been weaned off, sat well on RA. Chest x-ray is negative for infiltration.  Continue albuterol as needed. s/p Solu-Medrol 120 mg daily, changed to 40 mg IV every 12 hourly on 11/9, Continue Claritin 10 mg p.o. daily.  Robitussin-DM as needed for cough. RVP negative, Nasal cannula oxygen as needed to maintain O2 saturation 93% or greater. 11/10 start prednisone tapering dose tomorrow a.m. 40 mg daily for 1 day, 30 mg daily for 1 day, 20 mg daily for 1 day, 10 mg daily for 1 day.  Benign essential hypertension, resumed Metoprolol Coronary atherosclerosis of native coronary artery, resumed Pravastatin, metoprolol Neurogenic bladder: S/p of Foley cath, Foley catheter was changed on 09/13/2021 Continue Myrbetriq.  Foley catheter was changed again on 10/08/2021 today before discharge on patient's request, next Foley catheter will be changed after 1 month and follow with urology.   Chronic rheumatic arthritis, continue Plaquenil Hyperlipidemia, on Pravastatin Pancytopenia ([place]): Hemoglobin stable.  9.0 on 09/22/2021,  hemoglobin 10.2 today stable History of TIA (transient ischemic attack), -On Eliquis for A. Fib and -Pravastatin Chronic diastolic CHF (congestive heart failure) ([place]): 2D echo on 04/28/2020 showed EF CT density 5% with grade 2 diastolic dysfunction.  Patient does not have edema, JVD, pulm edema on chest x-ray, does not seem to have CHF exacerbation.  But BNP is elevated 216, patient is at risk of developing CHF exacerbation. s/p 1 dose of IV Lasix 40 mg x 1  COVID-19 virus infection: Patient was found incidentally COVID-positive on 10/21, that time patient was asymptomatic. Now is beyond the window for treatment. Bronchodilators as above And No isolation needed PAF (paroxysmal atrial fibrillation). Continue Eliquis, metoprolol Sacral decubitus ulcer, stage II: continue wound  care consult, Turn patient every 2 hourly, may use waffle cushion Body mass index is 43.7 kg/m.  Nutrition Interventions:   Pressure Injury 10/05/21 Buttocks Mid Stage 1 -  Intact skin with non-blanchable redness of a localized area usually over a bony prominence. (Active)  10/05/21 1730  Location: Buttocks  Location Orientation: Mid  Staging: Stage 1 -  Intact skin with non-blanchable redness of a localized area usually over a bony prominence.  Wound Description (Comments):   Present on Admission: Yes     Patient was seen by physical therapy, who recommended SNF, which was arranged. On the day of the discharge the patient's vitals were stable, and no other acute medical condition were reported by patient. the patient was felt safe to be discharge at [facility] .  Consultants: None Procedures: None  Discharge Exam: General: Appear in no distress, no Rash; Oral Mucosa Clear, moist. Cardiovascular: S1 and S2 Present, no Murmur, Respiratory: normal respiratory effort, Bilateral Air entry present and no Crackles, no wheezes Abdomen: Bowel Sound present, Soft and no tenderness, no hernia Extremities: no Pedal edema, no calf tenderness Neurology: alert and oriented to time, place, and person affect appropriate.  Filed Weights   10/05/21 1643 10/07/21 0347  Weight: 102.1 kg 126.6 kg   Vitals:   10/08/21 0729 10/08/21 0804  BP:  (!) 146/57  Pulse:  90  Resp:  18  Temp:  98.2 F (36.8 C)  SpO2: 90% 90%    DISCHARGE MEDICATION: Allergies as of 10/08/2021       Reactions   Chlorhexidine    Ciprofloxacin Nausea And Vomiting   Headache   Citalopram Hydrobromide Other (See Comments)   "unknown"   Clindamycin Nausea And Vomiting   Lorazepam Other (See Comments)   Adverse reaction   Paroxetine Nausea Only   Ramipril Other (See Comments)   "unknown"   Simvastatin    Sulfa Antibiotics Other (See Comments)        Medication List     STOP taking these medications    BALANCED  CARE SENIORS PO   PHOSPHATIDYL CHOLINE PO       TAKE these medications    acetaminophen 500 MG tablet Commonly known as: TYLENOL Take 500 mg by mouth 2 (two) times daily. May take an additional 500mg  as needed for arthritis   albuterol 108 (90 Base) MCG/ACT inhaler Commonly known as: VENTOLIN HFA Inhale 2 puffs into the lungs every 6 (six) hours as needed for wheezing or shortness of breath.   Align 4 MG Caps Take 1 capsule (4 mg total) by mouth daily.   ALOE PO Take 400 mg by mouth daily.   ALPRAZolam 0.5 MG tablet Commonly known as: XANAX Take 1 tablet (0.5 mg total) by mouth at bedtime.   apixaban 5 MG Tabs tablet Commonly known as: Eliquis Take 1 tablet (5 mg total) by mouth 2 (two) times daily.   cetirizine 10 MG tablet Commonly known as: ZYRTEC Take 10 mg by mouth at bedtime.   chlorhexidine 0.12 % solution Commonly known as: PERIDEX 1 mL by Mouth Rinse route as needed.   Co Q 10 100 MG Caps Take 100 mg by mouth daily.   CRANBERRY SOFT PO Take 250 mg by mouth daily.   dextromethorphan-guaiFENesin 30-600 MG 12hr tablet Commonly known as: MUCINEX DM Take 1 tablet by mouth 2 (two) times daily as needed for cough.   EPINEPHrine 0.3 mg/0.3 mL Soaj injection Commonly known as: EPI-PEN Inject 0.3 mg into the muscle as needed for anaphylaxis.   Fish Oil 1200 MG Caps Take 1,200 mg by mouth daily.   fluticasone 50 MCG/ACT nasal spray Commonly known as: FLONASE Place 2 sprays into both nostrils 2 (two) times daily.   folic acid 161 MCG tablet Commonly known as: FOLVITE Take 1,247 mcg by mouth daily.   gabapentin 600 MG tablet Commonly known as: NEURONTIN Take 600 mg by mouth 2 (two) times daily at 8 am and 10 pm.   hydroxychloroquine 200 MG tablet Commonly known as: PLAQUENIL Take 200 mg by mouth 2 (two) times daily.   iron polysaccharides 150 MG capsule Commonly known as: NIFEREX Take 1 capsule (150 mg total) by mouth daily.   ketoconazole 2 %  shampoo Commonly known as: NIZORAL Apply 1 application topically 2 (two) times a week.   ketoconazole 2 % cream Commonly known as: NIZORAL Apply 1 application topically 2 (two) times daily.   methotrexate 2.5 MG tablet Commonly known as: RHEUMATREX Take 10 tablets by mouth once a week. Taken on Wednesdays   metoprolol succinate 25 MG 24 hr tablet Commonly known as: TOPROL-XL Take 0.5 tablets (12.5 mg total) by mouth at bedtime.   milk thistle 175 MG tablet Take 175 mg by mouth daily.   Myrbetriq 50 MG Tb24 tablet Generic drug: mirabegron ER TAKE 1 TABLET(50 MG) BY MOUTH DAILY What changed: See the new instructions.  omeprazole 20 MG capsule Commonly known as: PRILOSEC Take 1 capsule (20 mg total) by mouth daily as needed.   ondansetron 4 MG tablet Commonly known as: ZOFRAN Take 1 tablet (4 mg total) by mouth every 6 (six) hours as needed for nausea.   polyethylene glycol powder 17 GM/SCOOP powder Commonly known as: GLYCOLAX/MIRALAX Take 17 g by mouth at bedtime.   pravastatin 20 MG tablet Commonly known as: PRAVACHOL TAKE 1 TABLET(20 MG) BY MOUTH AT BEDTIME What changed: See the new instructions.   predniSONE 10 MG tablet Commonly known as: DELTASONE Take 1 tablet (10 mg total) by mouth daily. 40 mg daily for 1 day, 30 mg daily for 1 day, 20 mg p.o. daily for 1 day, 10 mg daily for 1 day.   Selenium 200 MCG Caps Take 200 mcg by mouth daily.   traZODone 50 MG tablet Commonly known as: DESYREL Take 50 mg by mouth at bedtime.   triamcinolone ointment 0.1 % Commonly known as: KENALOG Apply 1 application topically as needed.   Turmeric 500 MG Caps Take 500 mg by mouth daily.   vitamin B-12 1000 MCG tablet Commonly known as: CYANOCOBALAMIN Take 1,000 mcg by mouth daily.   VITAMIN C PO Take 1,000 mg by mouth daily.   Vitamin D3 125 MCG (5000 UT) Caps Take 2,000 Units by mouth daily.   Zinc 50 MG Tabs Take by mouth.               Discharge Care  Instructions  (From admission, onward)           Start     Ordered   10/07/21 0000  Discharge wound care:       Comments: Continue wound care and turn patient every 2 hourly   10/07/21 1610           Allergies  Allergen Reactions   Chlorhexidine    Ciprofloxacin Nausea And Vomiting    Headache   Citalopram Hydrobromide Other (See Comments)    "unknown"   Clindamycin Nausea And Vomiting   Lorazepam Other (See Comments)    Adverse reaction   Paroxetine Nausea Only   Ramipril Other (See Comments)    "unknown"   Simvastatin    Sulfa Antibiotics Other (See Comments)   Discharge Instructions     Call MD for:  difficulty breathing, headache or visual disturbances   Complete by: As directed    Call MD for:  extreme fatigue   Complete by: As directed    Call MD for:  persistant dizziness or light-headedness   Complete by: As directed    Call MD for:  persistant nausea and vomiting   Complete by: As directed    Call MD for:  severe uncontrolled pain   Complete by: As directed    Call MD for:  temperature >100.4   Complete by: As directed    Diet - low sodium heart healthy   Complete by: As directed    Discharge instructions   Complete by: As directed    Follow-up with PCP in 1 week, patient started on prednisone tapering dose, continue albuterol as needed.  Patient will benefit from follow-up with pulmonologist and allergy specialist, patient was taking Xolair which can be resumed.   Discharge wound care:   Complete by: As directed    Continue wound care and turn patient every 2 hourly   Increase activity slowly   Complete by: As directed        The results  of significant diagnostics from this hospitalization (including imaging, microbiology, ancillary and laboratory) are listed below for reference.    Significant Diagnostic Studies: DG Chest 2 View  Result Date: 10/05/2021 CLINICAL DATA:  Shortness of breath, tested positive for COVID EXAM: CHEST - 2 VIEW  COMPARISON:  None. FINDINGS: Heart is enlarged. Prominent atherosclerotic calcification of the aortic arch. Lungs are clear without focal consolidation or large pleural effusion. Advanced osteoarthritis of bilateral glenohumeral joints. IMPRESSION: 1.  Cardiomegaly.  Prominent atherosclerotic disease of aorta. 2.  No focal consolidation or large pleural effusion. Electronically Signed   By: Keane Police D.O.   On: 10/05/2021 11:06   DG Chest Port 1 View  Result Date: 09/18/2021 CLINICAL DATA:  Coronavirus infection. EXAM: PORTABLE CHEST 1 VIEW COMPARISON:  09/11/2021 FINDINGS: Cardiomegaly as seen previously. Chronic aortic atherosclerotic calcification. Patchy density in both lower lobes, left more than right, consistent with atelectasis and or mild basilar pneumonia. No lobar consolidation or collapse. There could possibly be a small amount of pleural fluid on the left. IMPRESSION: Patchy density in the lung bases left more than right consistent with atelectasis or mild basilar pneumonia. Similar or slightly worsened compared to the study of 09/11/2021. Electronically Signed   By: Nelson Chimes M.D.   On: 09/18/2021 10:43   DG Chest Portable 1 View  Result Date: 09/11/2021 CLINICAL DATA:  Weakness, evaluate for edema EXAM: PORTABLE CHEST 1 VIEW COMPARISON:  Chest radiograph 01/18/2021 FINDINGS: The heart is at the upper limits of normal for size. The mediastinal contours are stable, with unchanged calcified atherosclerotic plaque of the aortic arch. There is vascular congestion and mild pulmonary interstitial edema. There is no focal consolidation. There is no pleural effusion. There is no pneumothorax. An accessory azygous fissure is again noted. There is marked degenerative change of the shoulders. IMPRESSION: Mild pulmonary interstitial edema. Electronically Signed   By: Valetta Mole M.D.   On: 09/11/2021 16:06    Microbiology: Recent Results (from the past 240 hour(s))  Respiratory (~20 pathogens)  panel by PCR     Status: None   Collection Time: 10/05/21  5:47 PM   Specimen: Nasopharyngeal Swab; Respiratory  Result Value Ref Range Status   Adenovirus NOT DETECTED NOT DETECTED Final   Coronavirus 229E NOT DETECTED NOT DETECTED Final    Comment: (NOTE) The Coronavirus on the Respiratory Panel, DOES NOT test for the novel  Coronavirus (2019 nCoV)    Coronavirus HKU1 NOT DETECTED NOT DETECTED Final   Coronavirus NL63 NOT DETECTED NOT DETECTED Final   Coronavirus OC43 NOT DETECTED NOT DETECTED Final   Metapneumovirus NOT DETECTED NOT DETECTED Final   Rhinovirus / Enterovirus NOT DETECTED NOT DETECTED Final   Influenza A NOT DETECTED NOT DETECTED Final   Influenza B NOT DETECTED NOT DETECTED Final   Parainfluenza Virus 1 NOT DETECTED NOT DETECTED Final   Parainfluenza Virus 2 NOT DETECTED NOT DETECTED Final   Parainfluenza Virus 3 NOT DETECTED NOT DETECTED Final   Parainfluenza Virus 4 NOT DETECTED NOT DETECTED Final   Respiratory Syncytial Virus NOT DETECTED NOT DETECTED Final   Bordetella pertussis NOT DETECTED NOT DETECTED Final   Bordetella Parapertussis NOT DETECTED NOT DETECTED Final   Chlamydophila pneumoniae NOT DETECTED NOT DETECTED Final   Mycoplasma pneumoniae NOT DETECTED NOT DETECTED Final    Comment: Performed at [facility], [address] [place], [place] [address]     Labs: CBC: Recent Labs  Lab 10/05/21 1254 10/06/21 0418 10/06/21 1054 10/07/21 0423  10/08/21 0517  WBC 3.5* 1.1* 2.6* 2.3* 4.4  NEUTROABS 1.7  --   --   --   --   HGB 10.3* 10.2* 10.7* 10.2* 10.1*  HCT 31.1* 29.9* 31.5* 31.1* 31.3*  MCV 101.3* 98.7 96.9 100.3* 101.3*  PLT 91* 90* 110* 110* 233*   Basic Metabolic Panel: Recent Labs  Lab 10/05/21 1254 10/06/21 1054 10/07/21 0423 10/08/21 0517  NA 134* 132* 136 138  K 5.0 4.0 4.6 4.7  CL 98 96* 101 103  CO2 30 26 30 30   GLUCOSE 96 142* 144* 138*  BUN 18 29* 34* 30*  CREATININE 0.89 0.99 0.86 0.83  CALCIUM 8.2* 8.4* 8.1*  8.5*  MG  --  1.9 2.3 2.4  PHOS  --  3.7 5.2* 3.8   Liver Function Tests: Recent Labs  Lab 10/05/21 1254  AST 19  ALT 12  ALKPHOS 49  BILITOT 0.8  PROT 5.8*  ALBUMIN 3.0*   No results for input(s): LIPASE, AMYLASE in the last 168 hours. No results for input(s): AMMONIA in the last 168 hours. Cardiac Enzymes: No results for input(s): CKTOTAL, CKMB, CKMBINDEX, TROPONINI in the last 168 hours. BNP (last 3 results) Recent Labs    09/11/21 1405 10/05/21 1254  BNP 442.7* 216.0*   CBG: No results for input(s): GLUCAP in the last 168 hours.  Time spent: 35 minutes  Signed:  Val Riles  [facility]  10/08/2021 10:19 AM

## 2021-10-07 NOTE — Evaluation (Signed)
Physical Therapy Evaluation Patient Details Name: [name] MRN: [id number] DOB: 1934-01-21 Today's Date: 10/07/2021  History of Present Illness  [name]  is a 87 y.o. male with a known history of chronic diastolic congestive heart failure, indwelling Foley catheter, rheumatoid arthritis, chronic hyponatremia, chronic kidney disease. He has been exhausted lately and having trouble sleeping, and has been weak and struggling to get up and down. In the ER he was hypotensive with a blood pressure of 94/37 on 1 occasion.  He was dehydrated with a BUN of 36 and a creatinine up to 2.28. Found to be COVID + 09/17/21.  Clinical Impression  Pt very motivated to work with PT but functionally is very limited when it comes to standing function.  Pt with OA issues t/o body that do limit mobility, tolerance and ROM.  Lacks TKE b/l with reports of increasing buckling in the months prior to admission.  Pt showed great effort with exercises but frankly was quite limited (R weaker than L).  2 standing attempts that were heavily assisted and did not result in fully upright standing.  Pt on 2L O2 t/o the session with sats generally remaining in the mid 90s.  Pt will need STR when medically ready for d/c.      Recommendations for follow up therapy are one component of a multi-disciplinary discharge planning process, led by the attending physician.  Recommendations may be updated based on patient status, additional functional criteria and insurance authorization.  Follow Up Recommendations Skilled nursing-short term rehab (<3 hours/day)    Assistance Recommended at Discharge Intermittent Supervision/Assistance  Functional Status Assessment Patient has had a recent decline in their functional status and demonstrates the ability to make significant improvements in function in a reasonable and predictable amount of time.  Equipment Recommendations  Other (comment) (TBD at rehab)    Recommendations for Other  Services       Precautions / Restrictions Precautions Precautions: Fall Restrictions Weight Bearing Restrictions: No      Mobility  Bed Mobility Overal bed mobility: Needs Assistance Bed Mobility: Sit to Supine;Rolling;Sidelying to Sit Rolling: Min guard Sidelying to sit: Mod assist;Min assist   Sit to supine: Mod assist   General bed mobility comments: Pt was able to do first half of transition to sitting but UE ROM/strength limitations necessitated assist with getting trunk to upright    Transfers Overall transfer level: Needs assistance Equipment used: Rolling walker (2 wheels) Transfers: Sit to/from Stand Sit to Stand: +2 physical assistance;From elevated surface;Max assist           General transfer comment: 2 seperate standing bouts, both of which required heavy +2 assist.  Pt lacks full knee extension and did not have the quad strength/UE strength to truly attain upright.  He maintained reto-lean and leaning back of legs on bed with inability, even with heavy cuing and assist, to effectively shift weight to the walker    Ambulation/Gait                  Stairs            Wheelchair Mobility    Modified Rankin (Stroke Patients Only)       Balance Overall balance assessment: Needs assistance Sitting-balance support: No upper extremity supported Sitting balance-[name]: Good     Standing balance support: Bilateral upper extremity supported Standing balance-[name]: Poor Standing balance comment: heavy assist with U&LE weakness, posterior lean and quick to fatigue  Pertinent Vitals/Pain Pain Assessment: Faces Faces Pain Scale: Hurts little more Pain Location: chronic arthritic pain t/o    Home Living Family/patient expects to be discharged to:: Skilled nursing facility Living Arrangements: Spouse/significant other Available Help at Discharge: Family;Available 24 hours/day Type of Home:  House Home Access: Stairs to enter Entrance Stairs-Rails: None Entrance Stairs-Number of Steps: 3   Home Layout: Two level;Able to live on main level with bedroom/bathroom Home Equipment: Rolling Walker (2 wheels) Additional Comments: Pt reports that he has a lift to get up the stairs to enter the home    Prior Function Prior Level of Function : Other (comment)             Mobility Comments: Pt reports that ~1 month ago he was able, with walker, to ambulate in the home and doing limited community mobility       Hand Dominance   Dominant Hand: Right    Extremity/Trunk Assessment   Upper Extremity Assessment Upper Extremity Assessment: Generalized weakness RUE Deficits / Details: Shoulder flexion AROM 0-30 (aprox) degrees; Elbow flexion/extension 3/5; Grip strength 4/5 LUE Deficits / Details: Shoulder flexion AROM 0-30 (aprox) degrees; Elbow flexion/extension 3/5    Lower Extremity Assessment Lower Extremity Assessment: Generalized weakness RLE Deficits / Details: lacks TKE (~5 deg), unable to SLR, grossly 3-/5, arthritic decreased ROM t/o LLE Deficits / Details: lacks TKE (~5 deg), able to do modified SLR, grossly 3/5 t/o with arthritic ROM limitations       Communication   Communication: No difficulties  Cognition Arousal/Alertness: Awake/alert Behavior During Therapy: WFL for tasks assessed/performed Overall Cognitive Status: Within Functional Limits for tasks assessed                                          General Comments General comments (skin integrity, edema, etc.): Pt very keen to try and get to the recliner but did not display ability to maintain standing posture well enough for safe transfer    Exercises General Exercises - Lower Extremity Ankle Circles/Pumps: AROM;Strengthening;Both;10 reps;Seated Heel Slides: Strengthening;10 reps Hip ABduction/ADduction: Strengthening;10 reps Straight Leg Raises: AAROM;AROM;10 reps (AROM on L,  AAROM on R)   Assessment/Plan    PT Assessment Patient needs continued PT services  PT Problem List Decreased strength;Decreased range of motion;Decreased activity tolerance;Decreased balance;Decreased mobility;Decreased coordination;Decreased cognition;Decreased knowledge of use of DME;Decreased safety awareness;Decreased knowledge of precautions       PT Treatment Interventions DME instruction;Gait training;Stair training;Functional mobility training;Therapeutic activities;Therapeutic exercise;Balance training;Neuromuscular re-education;Patient/family education;Wheelchair mobility training;Manual techniques    PT Goals (Current goals can be found in the Care Plan section)  Acute Rehab PT Goals PT Goal Formulation: With patient Time For Goal Achievement: 10/21/21 Potential to Achieve Goals: Fair    Frequency Min 2X/week   Barriers to discharge        Co-evaluation               AM-PAC PT "6 Clicks" Mobility  Outcome Measure Help needed turning from your back to your side while in a flat bed without using bedrails?: A Lot Help needed moving from lying on your back to sitting on the side of a flat bed without using bedrails?: A Lot Help needed moving to and from a bed to a chair (including a wheelchair)?: Total Help needed standing up from a chair using your arms (e.g., wheelchair or bedside chair)?: Total Help needed to walk  in hospital room?: Total Help needed climbing 3-5 steps with a railing? : Total 6 Click Score: 8    End of Session Equipment Utilized During Treatment: Gait belt Activity Tolerance: Patient tolerated treatment well;Patient limited by fatigue Patient left: in bed;with call bell/phone within reach;with bed alarm set;Other (comment)   PT Visit Diagnosis: Unsteadiness on feet (R26.81);Other abnormalities of gait and mobility (R26.89);Muscle weakness (generalized) (M62.81)    Time: 1010-1049 PT Time Calculation (min) (ACUTE ONLY): 39 min   Charges:    PT Evaluation $PT Eval Moderate Complexity: 1 Mod PT Treatments $Gait Training: 8-22 mins $Therapeutic Exercise: 8-22 mins        Kreg Shropshire, DPT 10/07/2021, 1:30 PM

## 2021-10-08 DIAGNOSIS — G629 Polyneuropathy, unspecified: Secondary | ICD-10-CM | POA: Diagnosis not present

## 2021-10-08 DIAGNOSIS — I959 Hypotension, unspecified: Secondary | ICD-10-CM | POA: Diagnosis not present

## 2021-10-08 DIAGNOSIS — R0602 Shortness of breath: Secondary | ICD-10-CM | POA: Diagnosis not present

## 2021-10-08 DIAGNOSIS — Z8709 Personal history of other diseases of the respiratory system: Secondary | ICD-10-CM | POA: Diagnosis not present

## 2021-10-08 DIAGNOSIS — R262 Difficulty in walking, not elsewhere classified: Secondary | ICD-10-CM | POA: Diagnosis not present

## 2021-10-08 DIAGNOSIS — R4781 Slurred speech: Secondary | ICD-10-CM | POA: Diagnosis present

## 2021-10-08 DIAGNOSIS — N189 Chronic kidney disease, unspecified: Secondary | ICD-10-CM | POA: Diagnosis not present

## 2021-10-08 DIAGNOSIS — J45901 Unspecified asthma with (acute) exacerbation: Secondary | ICD-10-CM | POA: Diagnosis not present

## 2021-10-08 DIAGNOSIS — N2882 Megaloureter: Secondary | ICD-10-CM | POA: Diagnosis not present

## 2021-10-08 DIAGNOSIS — M6281 Muscle weakness (generalized): Secondary | ICD-10-CM | POA: Diagnosis not present

## 2021-10-08 DIAGNOSIS — I480 Paroxysmal atrial fibrillation: Secondary | ICD-10-CM | POA: Diagnosis not present

## 2021-10-08 DIAGNOSIS — Z79899 Other long term (current) drug therapy: Secondary | ICD-10-CM | POA: Diagnosis not present

## 2021-10-08 DIAGNOSIS — I4891 Unspecified atrial fibrillation: Secondary | ICD-10-CM | POA: Diagnosis not present

## 2021-10-08 DIAGNOSIS — I358 Other nonrheumatic aortic valve disorders: Secondary | ICD-10-CM | POA: Diagnosis not present

## 2021-10-08 DIAGNOSIS — Z7901 Long term (current) use of anticoagulants: Secondary | ICD-10-CM | POA: Diagnosis not present

## 2021-10-08 DIAGNOSIS — N319 Neuromuscular dysfunction of bladder, unspecified: Secondary | ICD-10-CM | POA: Diagnosis not present

## 2021-10-08 DIAGNOSIS — I639 Cerebral infarction, unspecified: Secondary | ICD-10-CM | POA: Diagnosis not present

## 2021-10-08 DIAGNOSIS — R062 Wheezing: Secondary | ICD-10-CM | POA: Diagnosis not present

## 2021-10-08 DIAGNOSIS — J3489 Other specified disorders of nose and nasal sinuses: Secondary | ICD-10-CM | POA: Diagnosis not present

## 2021-10-08 DIAGNOSIS — R5381 Other malaise: Secondary | ICD-10-CM | POA: Diagnosis not present

## 2021-10-08 DIAGNOSIS — Z85528 Personal history of other malignant neoplasm of kidney: Secondary | ICD-10-CM | POA: Diagnosis not present

## 2021-10-08 DIAGNOSIS — G459 Transient cerebral ischemic attack, unspecified: Secondary | ICD-10-CM | POA: Diagnosis not present

## 2021-10-08 DIAGNOSIS — R3989 Other symptoms and signs involving the genitourinary system: Secondary | ICD-10-CM | POA: Diagnosis not present

## 2021-10-08 DIAGNOSIS — E46 Unspecified protein-calorie malnutrition: Secondary | ICD-10-CM | POA: Diagnosis not present

## 2021-10-08 DIAGNOSIS — R319 Hematuria, unspecified: Secondary | ICD-10-CM | POA: Diagnosis present

## 2021-10-08 DIAGNOSIS — J4520 Mild intermittent asthma, uncomplicated: Secondary | ICD-10-CM | POA: Diagnosis not present

## 2021-10-08 DIAGNOSIS — R0981 Nasal congestion: Secondary | ICD-10-CM | POA: Diagnosis not present

## 2021-10-08 DIAGNOSIS — J4541 Moderate persistent asthma with (acute) exacerbation: Secondary | ICD-10-CM | POA: Diagnosis not present

## 2021-10-08 DIAGNOSIS — J019 Acute sinusitis, unspecified: Secondary | ICD-10-CM | POA: Diagnosis not present

## 2021-10-08 DIAGNOSIS — R310 Gross hematuria: Secondary | ICD-10-CM | POA: Diagnosis not present

## 2021-10-08 DIAGNOSIS — N3289 Other specified disorders of bladder: Secondary | ICD-10-CM | POA: Diagnosis not present

## 2021-10-08 DIAGNOSIS — J189 Pneumonia, unspecified organism: Secondary | ICD-10-CM | POA: Diagnosis not present

## 2021-10-08 DIAGNOSIS — D61818 Other pancytopenia: Secondary | ICD-10-CM | POA: Diagnosis not present

## 2021-10-08 DIAGNOSIS — I2510 Atherosclerotic heart disease of native coronary artery without angina pectoris: Secondary | ICD-10-CM | POA: Diagnosis not present

## 2021-10-08 DIAGNOSIS — I10 Essential (primary) hypertension: Secondary | ICD-10-CM | POA: Diagnosis not present

## 2021-10-08 DIAGNOSIS — L89153 Pressure ulcer of sacral region, stage 3: Secondary | ICD-10-CM | POA: Diagnosis not present

## 2021-10-08 DIAGNOSIS — B999 Unspecified infectious disease: Secondary | ICD-10-CM | POA: Diagnosis not present

## 2021-10-08 DIAGNOSIS — G894 Chronic pain syndrome: Secondary | ICD-10-CM | POA: Diagnosis not present

## 2021-10-08 DIAGNOSIS — R339 Retention of urine, unspecified: Secondary | ICD-10-CM | POA: Diagnosis not present

## 2021-10-08 DIAGNOSIS — K5900 Constipation, unspecified: Secondary | ICD-10-CM | POA: Diagnosis not present

## 2021-10-08 DIAGNOSIS — M0579 Rheumatoid arthritis with rheumatoid factor of multiple sites without organ or systems involvement: Secondary | ICD-10-CM | POA: Diagnosis not present

## 2021-10-08 DIAGNOSIS — R051 Acute cough: Secondary | ICD-10-CM | POA: Diagnosis not present

## 2021-10-08 DIAGNOSIS — G4484 Primary exertional headache: Secondary | ICD-10-CM | POA: Diagnosis not present

## 2021-10-08 DIAGNOSIS — Z87440 Personal history of urinary (tract) infections: Secondary | ICD-10-CM | POA: Diagnosis not present

## 2021-10-08 DIAGNOSIS — Z8673 Personal history of transient ischemic attack (TIA), and cerebral infarction without residual deficits: Secondary | ICD-10-CM | POA: Diagnosis not present

## 2021-10-08 DIAGNOSIS — Z8546 Personal history of malignant neoplasm of prostate: Secondary | ICD-10-CM | POA: Diagnosis not present

## 2021-10-08 DIAGNOSIS — I5032 Chronic diastolic (congestive) heart failure: Secondary | ICD-10-CM | POA: Diagnosis not present

## 2021-10-08 DIAGNOSIS — I130 Hypertensive heart and chronic kidney disease with heart failure and stage 1 through stage 4 chronic kidney disease, or unspecified chronic kidney disease: Secondary | ICD-10-CM | POA: Diagnosis not present

## 2021-10-08 DIAGNOSIS — J4550 Severe persistent asthma, uncomplicated: Secondary | ICD-10-CM | POA: Diagnosis not present

## 2021-10-08 DIAGNOSIS — M25519 Pain in unspecified shoulder: Secondary | ICD-10-CM | POA: Diagnosis not present

## 2021-10-08 DIAGNOSIS — Z859 Personal history of malignant neoplasm, unspecified: Secondary | ICD-10-CM | POA: Diagnosis not present

## 2021-10-08 DIAGNOSIS — Z7401 Bed confinement status: Secondary | ICD-10-CM | POA: Diagnosis not present

## 2021-10-08 DIAGNOSIS — J9601 Acute respiratory failure with hypoxia: Secondary | ICD-10-CM | POA: Diagnosis not present

## 2021-10-08 DIAGNOSIS — Z85520 Personal history of malignant carcinoid tumor of kidney: Secondary | ICD-10-CM | POA: Diagnosis not present

## 2021-10-08 DIAGNOSIS — Z96653 Presence of artificial knee joint, bilateral: Secondary | ICD-10-CM | POA: Diagnosis not present

## 2021-10-08 DIAGNOSIS — R0989 Other specified symptoms and signs involving the circulatory and respiratory systems: Secondary | ICD-10-CM | POA: Diagnosis not present

## 2021-10-08 DIAGNOSIS — R471 Dysarthria and anarthria: Secondary | ICD-10-CM | POA: Diagnosis not present

## 2021-10-08 DIAGNOSIS — J4540 Moderate persistent asthma, uncomplicated: Secondary | ICD-10-CM | POA: Diagnosis not present

## 2021-10-08 DIAGNOSIS — J3089 Other allergic rhinitis: Secondary | ICD-10-CM | POA: Diagnosis not present

## 2021-10-08 DIAGNOSIS — Z743 Need for continuous supervision: Secondary | ICD-10-CM | POA: Diagnosis not present

## 2021-10-08 DIAGNOSIS — I509 Heart failure, unspecified: Secondary | ICD-10-CM | POA: Diagnosis not present

## 2021-10-08 DIAGNOSIS — Z8616 Personal history of COVID-19: Secondary | ICD-10-CM | POA: Diagnosis not present

## 2021-10-08 LAB — BASIC METABOLIC PANEL
Anion gap: 5 (ref 5–15)
BUN: 30 mg/dL — ABNORMAL HIGH (ref 8–23)
CO2: 30 mmol/L (ref 22–32)
Calcium: 8.5 mg/dL — ABNORMAL LOW (ref 8.9–10.3)
Chloride: 103 mmol/L (ref 98–111)
Creatinine, Ser: 0.83 mg/dL (ref 0.61–1.24)
GFR, Estimated: >60 mL/min (ref >60)
Glucose, Bld: 138 mg/dL — ABNORMAL HIGH (ref 70–99)
Potassium: 4.7 mmol/L (ref 3.5–5.1)
Sodium: 138 mmol/L (ref 135–145)

## 2021-10-08 LAB — CBC
HCT: 31.3 % — ABNORMAL LOW (ref 39.0–52.0)
Hemoglobin: 10.1 g/dL — ABNORMAL LOW (ref 13.0–17.0)
MCH: 32.7 pg (ref 26.0–34.0)
MCHC: 32.3 g/dL (ref 30.0–36.0)
MCV: 101.3 fL — ABNORMAL HIGH (ref 80.0–100.0)
Platelets: 119 10*3/uL — ABNORMAL LOW (ref 150–400)
RBC: 3.09 MIL/uL — ABNORMAL LOW (ref 4.22–5.81)
RDW: 14.3 % (ref 11.5–15.5)
WBC: 4.4 10*3/uL (ref 4.0–10.5)
nRBC: 0 % (ref 0.0–0.2)

## 2021-10-08 LAB — PHOSPHORUS: Phosphorus: 3.8 mg/dL (ref 2.5–4.6)

## 2021-10-08 LAB — MAGNESIUM: Magnesium: 2.4 mg/dL (ref 1.7–2.4)

## 2021-10-08 NOTE — Progress Notes (Signed)
Cath Change/ Replacement  Patient is present today for a catheter change due to urinary retention.  25ml of water was removed from the balloon, a 16FR foley cath was removed without difficulty.  Patient was cleaned and prepped in a sterile fashion with betadine and 2% lidocaine jelly was instilled into the urethra. A 16 FR foley cath was replaced into the bladder no complications were noted Urine return was not noted consistent with prior exchanges. The balloon was filled with 52ml of sterile water. A night bag was attached for drainage.  Patient tolerated well.    Performed by: Debroah Loop, PA-C   Follow up: 1 month catheter change in clinic, scheduled

## 2021-10-08 NOTE — Care Management Important Message (Signed)
Important Message  Patient Details  Name: [name] MRN: [id number] Date of Birth: 1934/05/06   Medicare Important Message Given:  Yes     Juliann Pulse A Cleopatra Sardo 10/08/2021, 11:39 AM

## 2021-10-08 NOTE — TOC Transition Note (Signed)
Transition of Care [facility]) - CM/SW Discharge Note   Patient Details  Name: [name] MRN: [id number] Date of Birth: 05-Feb-1934  [facility] CM/SW Contact:  Eileen Stanford, LCSW Phone Number: 10/08/2021, 1:20 PM   Clinical Narrative:   Clinical Social Worker facilitated patient discharge including contacting patient family and facility to confirm patient discharge plans.  Clinical information faxed to facility and family agreeable with plan.  CSW arranged ambulance transport via [facility] to [facility] (room 107B).  RN to call [phone] for report prior to discharge.     Final next level of care: Skilled Nursing Facility Barriers to Discharge: No Barriers Identified   Patient Goals and CMS Choice Patient states their goals for this hospitalization and ongoing recovery are:: to go to snf   Choice offered to / list presented to : Patient, Adult Children  Discharge Placement              Patient chooses bed at:  [facility]) Patient to be transferred to facility by: [place] Name of family member notified: vmail left with son Patient and family notified of of transfer: 10/08/21  Discharge Plan and Services In-house Referral: NA   Post Acute Care Choice: [address]                               Social Determinants of Health (SDOH) Interventions     Readmission Risk Interventions Readmission Risk Prevention Plan 09/12/2021 06/23/2019  Transportation Screening Complete Complete  PCP or Specialist Appt within 3-5 Days - Complete  HRI or [place] - Complete  Social Work Consult for [place] Planning/Counseling - Complete  Palliative Care Screening - Not Applicable  Medication Review Press photographer) Complete Complete  PCP or Specialist appointment within 3-5 days of discharge Complete -  [place] or Home Care Consult Complete -  SW Recovery Care/Counseling Consult Complete -  Palliative Care Screening Not  Applicable -  Skilled Nursing Facility Complete -  Some recent data might be hidden

## 2021-10-11 DIAGNOSIS — L89153 Pressure ulcer of sacral region, stage 3: Secondary | ICD-10-CM | POA: Diagnosis not present

## 2021-10-12 ENCOUNTER — Ambulatory Visit: Payer: Self-pay

## 2021-10-12 ENCOUNTER — Ambulatory Visit: Payer: Medicare PPO

## 2021-10-13 ENCOUNTER — Ambulatory Visit (INDEPENDENT_AMBULATORY_CARE_PROVIDER_SITE_OTHER): Payer: Medicare PPO

## 2021-10-13 ENCOUNTER — Telehealth: Payer: Self-pay

## 2021-10-13 ENCOUNTER — Other Ambulatory Visit: Payer: Self-pay

## 2021-10-13 VITALS — BP 108/68 | HR 78 | Temp 97.7°F | Resp 16

## 2021-10-13 DIAGNOSIS — J4550 Severe persistent asthma, uncomplicated: Secondary | ICD-10-CM

## 2021-10-13 DIAGNOSIS — M25519 Pain in unspecified shoulder: Secondary | ICD-10-CM | POA: Diagnosis not present

## 2021-10-13 DIAGNOSIS — M6281 Muscle weakness (generalized): Secondary | ICD-10-CM | POA: Diagnosis not present

## 2021-10-13 DIAGNOSIS — J4540 Moderate persistent asthma, uncomplicated: Secondary | ICD-10-CM

## 2021-10-13 DIAGNOSIS — R5381 Other malaise: Secondary | ICD-10-CM | POA: Diagnosis not present

## 2021-10-13 DIAGNOSIS — R262 Difficulty in walking, not elsewhere classified: Secondary | ICD-10-CM | POA: Diagnosis not present

## 2021-10-13 MED ORDER — SODIUM CHLORIDE 0.9 % IV SOLN: Freq: Once | INTRAVENOUS | Status: DC | PRN

## 2021-10-13 MED ORDER — METHYLPREDNISOLONE SODIUM SUCC 125 MG IJ SOLR: 125.0000 mg | Freq: Once | INTRAMUSCULAR | Status: DC | PRN

## 2021-10-13 MED ORDER — EPINEPHRINE 0.3 MG/0.3ML IJ SOAJ: 0.3000 mg | Freq: Once | INTRAMUSCULAR | Status: DC | PRN

## 2021-10-13 MED ORDER — ALBUTEROL SULFATE HFA 108 (90 BASE) MCG/ACT IN AERS: 2.0000 | INHALATION_SPRAY | Freq: Once | RESPIRATORY_TRACT | Status: DC | PRN

## 2021-10-13 MED ORDER — DIPHENHYDRAMINE HCL 50 MG/ML IJ SOLN: 50.0000 mg | Freq: Once | INTRAMUSCULAR | Status: DC | PRN

## 2021-10-13 MED ORDER — OMALIZUMAB 75 MG/0.5ML ~~LOC~~ SOSY
375.0000 mg | PREFILLED_SYRINGE | Freq: Once | SUBCUTANEOUS | Status: AC
  Administered 2021-10-13: 375 mg via SUBCUTANEOUS

## 2021-10-13 MED ORDER — FAMOTIDINE IN NACL 20-0.9 MG/50ML-% IV SOLN: 20.0000 mg | Freq: Once | INTRAVENOUS | Status: DC | PRN

## 2021-10-13 NOTE — Progress Notes (Signed)
Diagnosis: Asthma  Provider:  Marshell Garfinkel, MD  Procedure: Injection  Xolair (Omalizumab), Dose: 375 mg, Site: subcutaneous  Discharge: Condition: Good, Destination: Home . AVS provided to patient.   Performed by:  Sarahbeth Cashin, Sherlon Handing, LPN

## 2021-10-13 NOTE — Telephone Encounter (Signed)
Transition Care Management Unsuccessful Follow-up Telephone Call  Date of discharge and from where:  10/08/2021 / [facility]  Attempts: Call completed  Reason for unsuccessful TCM follow-up call:  Per contact answering phone, patient is in the hospital   Quinn Plowman RN,BSN,CCM RN Case Manager [place]  [phone]

## 2021-10-14 ENCOUNTER — Telehealth: Payer: Self-pay

## 2021-10-14 ENCOUNTER — Ambulatory Visit: Payer: Medicare PPO | Admitting: Physician Assistant

## 2021-10-14 DIAGNOSIS — K5900 Constipation, unspecified: Secondary | ICD-10-CM | POA: Diagnosis not present

## 2021-10-14 DIAGNOSIS — Z8709 Personal history of other diseases of the respiratory system: Secondary | ICD-10-CM | POA: Diagnosis not present

## 2021-10-14 DIAGNOSIS — M6281 Muscle weakness (generalized): Secondary | ICD-10-CM | POA: Diagnosis not present

## 2021-10-14 DIAGNOSIS — Z87440 Personal history of urinary (tract) infections: Secondary | ICD-10-CM | POA: Diagnosis not present

## 2021-10-14 NOTE — Telephone Encounter (Signed)
Missed 2 Xolair injections due to recent illnesses. Now at [facility]. Said his medication list is messed up. At some point in the last few weeks furosemide was removed from his med list while in the hospital. Once was due to the UTI and dehydration. Pt thinks he was put back on it but not on med list. She has reached out to his kidney doctor but they are closed until Monday. Asking if Dr Silvio Pate can tell if he is to be on or off of the furosemide.

## 2021-10-14 NOTE — Telephone Encounter (Signed)
Spoke to [place]. She will let the admitting Dr know what Dr Silvio Pate said.

## 2021-10-18 ENCOUNTER — Ambulatory Visit: Payer: Medicare PPO

## 2021-10-18 DIAGNOSIS — R0989 Other specified symptoms and signs involving the circulatory and respiratory systems: Secondary | ICD-10-CM | POA: Diagnosis not present

## 2021-10-18 DIAGNOSIS — G4484 Primary exertional headache: Secondary | ICD-10-CM | POA: Diagnosis not present

## 2021-10-18 DIAGNOSIS — I10 Essential (primary) hypertension: Secondary | ICD-10-CM | POA: Diagnosis not present

## 2021-10-18 DIAGNOSIS — Z8673 Personal history of transient ischemic attack (TIA), and cerebral infarction without residual deficits: Secondary | ICD-10-CM | POA: Diagnosis not present

## 2021-10-18 DIAGNOSIS — Z7901 Long term (current) use of anticoagulants: Secondary | ICD-10-CM | POA: Diagnosis not present

## 2021-10-18 DIAGNOSIS — R062 Wheezing: Secondary | ICD-10-CM | POA: Diagnosis not present

## 2021-10-18 DIAGNOSIS — J45901 Unspecified asthma with (acute) exacerbation: Secondary | ICD-10-CM | POA: Diagnosis not present

## 2021-10-19 ENCOUNTER — Other Ambulatory Visit: Payer: Self-pay

## 2021-10-19 ENCOUNTER — Emergency Department (HOSPITAL_COMMUNITY): Payer: Medicare PPO

## 2021-10-19 ENCOUNTER — Emergency Department (HOSPITAL_COMMUNITY)
Admission: EM | Admit: 2021-10-19 | Discharge: 2021-10-19 | Disposition: A | Discharge status: Home / Self Care | Payer: Medicare PPO | Attending: Emergency Medicine | Admitting: Emergency Medicine

## 2021-10-19 ENCOUNTER — Encounter (HOSPITAL_COMMUNITY): Payer: Self-pay

## 2021-10-19 ENCOUNTER — Telehealth: Payer: Self-pay | Admitting: Physician Assistant

## 2021-10-19 DIAGNOSIS — I358 Other nonrheumatic aortic valve disorders: Secondary | ICD-10-CM | POA: Diagnosis not present

## 2021-10-19 DIAGNOSIS — N2882 Megaloureter: Secondary | ICD-10-CM | POA: Diagnosis not present

## 2021-10-19 DIAGNOSIS — J45901 Unspecified asthma with (acute) exacerbation: Secondary | ICD-10-CM | POA: Diagnosis not present

## 2021-10-19 DIAGNOSIS — I2510 Atherosclerotic heart disease of native coronary artery without angina pectoris: Secondary | ICD-10-CM | POA: Insufficient documentation

## 2021-10-19 DIAGNOSIS — K5900 Constipation, unspecified: Secondary | ICD-10-CM | POA: Diagnosis not present

## 2021-10-19 DIAGNOSIS — R310 Gross hematuria: Secondary | ICD-10-CM | POA: Insufficient documentation

## 2021-10-19 DIAGNOSIS — Z8546 Personal history of malignant neoplasm of prostate: Secondary | ICD-10-CM | POA: Diagnosis not present

## 2021-10-19 DIAGNOSIS — R319 Hematuria, unspecified: Secondary | ICD-10-CM | POA: Diagnosis not present

## 2021-10-19 DIAGNOSIS — N3289 Other specified disorders of bladder: Secondary | ICD-10-CM | POA: Diagnosis not present

## 2021-10-19 DIAGNOSIS — I509 Heart failure, unspecified: Secondary | ICD-10-CM | POA: Diagnosis not present

## 2021-10-19 DIAGNOSIS — Z8673 Personal history of transient ischemic attack (TIA), and cerebral infarction without residual deficits: Secondary | ICD-10-CM | POA: Insufficient documentation

## 2021-10-19 DIAGNOSIS — I130 Hypertensive heart and chronic kidney disease with heart failure and stage 1 through stage 4 chronic kidney disease, or unspecified chronic kidney disease: Secondary | ICD-10-CM | POA: Diagnosis not present

## 2021-10-19 DIAGNOSIS — R3989 Other symptoms and signs involving the genitourinary system: Secondary | ICD-10-CM | POA: Diagnosis not present

## 2021-10-19 DIAGNOSIS — Z7901 Long term (current) use of anticoagulants: Secondary | ICD-10-CM | POA: Diagnosis not present

## 2021-10-19 DIAGNOSIS — N189 Chronic kidney disease, unspecified: Secondary | ICD-10-CM | POA: Diagnosis not present

## 2021-10-19 DIAGNOSIS — Z85520 Personal history of malignant carcinoid tumor of kidney: Secondary | ICD-10-CM | POA: Diagnosis not present

## 2021-10-19 LAB — CBC WITH DIFFERENTIAL/PLATELET
Abs Immature Granulocytes: 0.07 10*3/uL (ref 0.00–0.07)
Basophils Absolute: 0.1 10*3/uL (ref 0.0–0.1)
Basophils Relative: 1 %
Eosinophils Absolute: 0.2 10*3/uL (ref 0.0–0.5)
Eosinophils Relative: 2 %
HCT: 33.9 % — ABNORMAL LOW (ref 39.0–52.0)
Hemoglobin: 11.2 g/dL — ABNORMAL LOW (ref 13.0–17.0)
Immature Granulocytes: 1 %
Lymphocytes Relative: 9 %
Lymphs Abs: 0.7 10*3/uL (ref 0.7–4.0)
MCH: 33.7 pg (ref 26.0–34.0)
MCHC: 33 g/dL (ref 30.0–36.0)
MCV: 102.1 fL — ABNORMAL HIGH (ref 80.0–100.0)
Monocytes Absolute: 0.2 10*3/uL (ref 0.1–1.0)
Monocytes Relative: 3 %
Neutro Abs: 6.6 10*3/uL (ref 1.7–7.7)
Neutrophils Relative %: 84 %
Platelets: 112 10*3/uL — ABNORMAL LOW (ref 150–400)
RBC: 3.32 MIL/uL — ABNORMAL LOW (ref 4.22–5.81)
RDW: 14.6 % (ref 11.5–15.5)
WBC: 7.8 10*3/uL (ref 4.0–10.5)
nRBC: 0 % (ref 0.0–0.2)

## 2021-10-19 LAB — URINALYSIS, ROUTINE W REFLEX MICROSCOPIC: RBC / HPF: >50 RBC/hpf — ABNORMAL HIGH (ref 0–5)

## 2021-10-19 LAB — BASIC METABOLIC PANEL
Anion gap: 4 — ABNORMAL LOW (ref 5–15)
BUN: 20 mg/dL (ref 8–23)
CO2: 26 mmol/L (ref 22–32)
Calcium: 8.2 mg/dL — ABNORMAL LOW (ref 8.9–10.3)
Chloride: 104 mmol/L (ref 98–111)
Creatinine, Ser: 0.91 mg/dL (ref 0.61–1.24)
GFR, Estimated: >60 mL/min (ref >60)
Glucose, Bld: 105 mg/dL — ABNORMAL HIGH (ref 70–99)
Potassium: 5.7 mmol/L — ABNORMAL HIGH (ref 3.5–5.1)
Sodium: 134 mmol/L — ABNORMAL LOW (ref 135–145)

## 2021-10-19 MED ORDER — SODIUM CHLORIDE 0.9 % IV BOLUS
1000.0000 mL | Freq: Once | INTRAVENOUS | Status: AC
  Administered 2021-10-19: 1000 mL via INTRAVENOUS

## 2021-10-19 MED ORDER — CEPHALEXIN 500 MG PO CAPS: 500.0000 mg | ORAL_CAPSULE | Freq: Four times a day (QID) | ORAL | 0 refills | Status: DC

## 2021-10-19 MED ORDER — METOPROLOL TARTRATE 25 MG PO TABS
12.5000 mg | ORAL_TABLET | Freq: Once | ORAL | Status: AC
  Administered 2021-10-19: 12.5 mg via ORAL
  Filled 2021-10-19: qty 1

## 2021-10-19 MED ORDER — FLEET ENEMA 7-19 GM/118ML RE ENEM
1.0000 | ENEMA | Freq: Once | RECTAL | Status: AC
  Administered 2021-10-19: 1 via RECTAL
  Filled 2021-10-19: qty 1

## 2021-10-19 MED ORDER — METOPROLOL SUCCINATE ER 25 MG PO TB24: 12.5000 mg | ORAL_TABLET | Freq: Two times a day (BID) | ORAL | 0 refills | Status: DC

## 2021-10-19 MED ORDER — CEPHALEXIN 500 MG PO CAPS
500.0000 mg | ORAL_CAPSULE | Freq: Once | ORAL | Status: AC
  Administered 2021-10-19: 500 mg via ORAL
  Filled 2021-10-19: qty 1

## 2021-10-19 NOTE — ED Triage Notes (Signed)
Pt brought in by ems for hematuria. Pt has chronic foley. Pt from [facility].

## 2021-10-19 NOTE — Discharge Instructions (Addendum)
Hold Eliquis for 2 days.  He must use a Leg Bag when he does his physical therapy.

## 2021-10-19 NOTE — ED Provider Notes (Signed)
[facility] DEPT Provider Note   CSN: [id number] Arrival date & time: 10/19/21  1133     History Chief Complaint  Patient presents with   Hematuria    [name] is a 87 y.o. male.  Pt presents to the ED today with hematuria.  Pt has had an indwelling foley for 11 years.  Pt said it is related to his prostate cancer treatment.  Pt noticed his urine was brown 2 days ago.  It looked bloody today.  Pt is on Eliquis for a hx of afib.  Pt denies any pain.      Past Medical History:  Diagnosis Date   (HFpEF) heart failure with preserved ejection fraction ([place])    a. 02/2019 Echo: Ef 60-65%, mildly reduced RV fxn. RVSP 59mmHg. Mildly dil LA. Mod dil RA. Mild to mod TR. Mild to mod AS. Triv AI.   Allergy    Anemia    Anxiety    Arthritis    rheumatoid   Asthma    Basal cell carcinoma    back   CAD (coronary artery disease)    CHF (congestive heart failure) (HCC)    Chronic kidney disease    Mass right kidney   Chronic sinusitis    Collagen vascular disease (HCC)    Rheumatoid Arthritis   Depression    Diverticulitis    Dysrhythmia    ED (erectile dysfunction)    GERD (gastroesophageal reflux disease)    History of SIADH    Hx of adenomatous colonic polyps    Hypertension    Hyponatremia    IBS (irritable bowel syndrome)    Neurodermatitis    Neurogenic bladder    OSA (obstructive sleep apnea)    no CPAP since weight loss   Persistent atrial fibrillation ([place])    a. Dx 02/2019; b. CHA2DS2VASc = 6-->eliquis initiated.   Prostate cancer [facility])    Renal mass    10/2018 4.2cm R renal cortical mass. Most compatible w/ clear cell renal cell carcinoma.    Patient Active Problem List   Diagnosis Date Noted   Asthma exacerbation 10/05/2021   PAF (paroxysmal atrial fibrillation) ([place]) 10/05/2021   Acute respiratory failure with hypoxia ([place]) 10/05/2021   Sacral decubitus ulcer, stage II ([place]) 10/05/2021   COVID-19 virus infection  09/19/2021   E coli bacteremia    Acute kidney injury superimposed on CKD ([place]) 09/11/2021   Hypotension    Thrombocytopenia (HCC)    Chronic diastolic CHF (congestive heart failure) ([place])    Weakness    Neuropathy    Severe persistent asthma 02/05/2021   Postprocedure pain management follow-up 12/08/2020   Spondylosis without myelopathy or radiculopathy, lumbosacral region 11/24/2020   Elevated C-reactive protein (CRP) 11/24/2020   Elevated sed rate 11/24/2020   Chronic pain syndrome 11/04/2020   Pharmacologic therapy 11/04/2020   Disorder of skeletal system 11/04/2020   Problems influencing health status 11/04/2020   Abnormal MRI, lumbar spine (09/21/2020) 11/04/2020   Chronic low back pain (Bilateral) w/o sciatica 11/04/2020   DDD (degenerative disc disease), lumbosacral 11/04/2020   Lumbosacral foraminal stenosis 11/04/2020   Lumbar facet hypertrophy 11/04/2020   Levoscoliosis of lumbosacral spine 11/04/2020   Grade 1 Retrolisthesis at L2-3 and L3-4 11/04/2020   Chronic anticoagulation (Eliquis) 11/04/2020   Lumbar facet syndrome 11/04/2020   Long term prescription benzodiazepine use 11/04/2020   Chronic shoulder pain (Bilateral) 11/04/2020   Chronic elbow pain (Bilateral) 11/04/2020   Chronic wrist pain (Bilateral) 11/04/2020  Chronic sinusitis 07/12/2020   Persistent atrial fibrillation ([place]) 07/12/2020   Pancytopenia ([place]) 09/12/2019   Edema of lower extremity 09/12/2019   Hyposmolality and/or hyponatremia 09/12/2019   Renal cell carcinoma ([place]) 07/11/2019   AF (paroxysmal atrial fibrillation) ([place]) 05/09/2019   History of TIA (transient ischemic attack) 12/06/2018   Vision changes 12/06/2018   TIA (transient ischemic attack) 06/06/2018   Carotid artery disease ([place]) 06/06/2018   Frailty 04/24/2018   Long term current use of immunosuppressive drug 04/24/2018   Obesity (BMI 30-39.9) 04/24/2018   Fever 11/02/2017   SIADH (syndrome of inappropriate ADH production)  ([place]) 04/17/2017   AKI (acute kidney injury) ([place]) 04/03/2017   Hyponatremia 01/16/2017   Urinary tract infection associated with indwelling urethral catheter ([place]) 12/19/2016   Asthma with acute exacerbation 11/14/2016   Mild intermittent asthma without complication 73/53/2992   Mood disorder ([place]) 01/18/2016   Presence of indwelling urinary catheter 08/20/2015   Advanced directives, counseling/discussion 07/28/2014   Routine general medical examination at a health care facility 04/09/2012   Venous stasis dermatitis 06/13/2011   Sleep disturbance 12/21/2010   CONSTIPATION, CHRONIC 12/07/2010   Chronic constipation 12/07/2010   Hyperlipidemia 12/02/2010   OSTEOARTHRITIS 12/02/2010   Neurogenic bladder 05/24/2010   IBS 05/05/2010   History of colonic polyps 05/05/2010   NEUROPATHY 05/03/2010   Mononeuritis 05/03/2010   Personal history of prostate cancer 03/04/2010   Benign essential hypertension 03/25/2009   GAD (generalized anxiety disorder) 01/27/2007   Coronary atherosclerosis of native coronary artery 01/27/2007   Allergic asthma 01/27/2007   GERD 01/27/2007   Chronic rheumatic arthritis ([place]) 01/27/2007   Rheumatoid arthritis involving multiple sites with positive rheumatoid factor ([place]) 01/27/2007    Past Surgical History:  Procedure Laterality Date   CARDIOVERSION N/A 04/23/2019   Procedure: CARDIOVERSION;  Surgeon: Nelva Bush, MD;  Location: [facility] ORS;  Service: Cardiovascular;  Laterality: N/A;   KNEE ARTHROPLASTY Right 09/02/2015   Procedure: COMPUTER ASSISTED TOTAL KNEE ARTHROPLASTY;  Surgeon: [name], MD;  Location: [facility] ORS;  Service: Orthopedics;  Laterality: Right;   LAPAROSCOPIC NEPHRECTOMY, HAND ASSISTED Right 06/17/2019   Procedure: HAND ASSISTED LAPAROSCOPIC NEPHRECTOMY;  Surgeon: Hollice Espy, MD;  Location: [facility] ORS;  Service: Urology;  Laterality: Right;   NASAL SINUS SURGERY  2009   DEVIATED SEPTUM AND POLYPS   permanent indwelling catheter      PROSTATE SURGERY     PROSTATECTOMY   TOTAL KNEE ARTHROPLASTY Left 9/15   Dr [name]   URETHRAL STRICTURE DILATATION  02-2010   Dr.Cope       Family History  Problem Relation Age of Onset   Heart disease Mother 9       heart failure   Heart failure Mother    Pneumonia Father 43       pnemonia   Skin cancer Father    Skin cancer Sister    Skin cancer Son    Colon cancer Neg Hx    Esophageal cancer Neg Hx    Stomach cancer Neg Hx    Pancreatic cancer Neg Hx    Liver disease Neg Hx     Social History   Tobacco Use   Smoking status: Never   Smokeless tobacco: Never  Vaping Use   Vaping Use: Never used  Substance Use Topics   Alcohol use: Yes    Comment: 5 days out of 7 drinks scotch   Drug use: No    Home Medications Prior to Admission medications   Medication Sig Start  Date End Date Taking? Authorizing Provider  cephALEXin (KEFLEX) 500 MG capsule Take 1 capsule (500 mg total) by mouth 4 (four) times daily. 10/19/21  Yes Isla Pence, MD  acetaminophen (TYLENOL) 500 MG tablet Take 500 mg by mouth 2 (two) times daily. May take an additional 500mg  as needed for arthritis    [provider]  albuterol (VENTOLIN HFA) 108 (90 Base) MCG/ACT inhaler Inhale 2 puffs into the lungs every 6 (six) hours as needed for wheezing or shortness of breath. 10/07/21   Val Riles, MD  ALOE PO Take 400 mg by mouth daily.     [provider]  ALPRAZolam Duanne Moron) 0.5 MG tablet Take 1 tablet (0.5 mg total) by mouth at bedtime. 10/07/21   Val Riles, MD  apixaban (ELIQUIS) 5 MG TABS tablet Take 1 tablet (5 mg total) by mouth 2 (two) times daily. 09/28/21   Max Sane, MD  Ascorbic Acid (VITAMIN C PO) Take 1,000 mg by mouth daily.     [provider]  cetirizine (ZYRTEC) 10 MG tablet Take 10 mg by mouth at bedtime.    [provider]  chlorhexidine (PERIDEX) 0.12 % solution 1 mL by Mouth Rinse route as needed. 10/13/20   [provider]   Cholecalciferol (VITAMIN D3) 5000 UNITS CAPS Take 2,000 Units by mouth daily.     [provider]  Coenzyme Q10 (CO Q 10) 100 MG CAPS Take 100 mg by mouth daily.     [provider]  CRANBERRY SOFT PO Take 250 mg by mouth daily.    [provider]  dextromethorphan-guaiFENesin (MUCINEX DM) 30-600 MG 12hr tablet Take 1 tablet by mouth 2 (two) times daily as needed for cough. 10/07/21   Val Riles, MD  EPINEPHrine 0.3 mg/0.3 mL IJ SOAJ injection Inject 0.3 mg into the muscle as needed for anaphylaxis.    [provider]  fluticasone (FLONASE) 50 MCG/ACT nasal spray Place 2 sprays into both nostrils 2 (two) times daily. 04/27/17   [name], MD  folic acid (FOLVITE) 144 MCG tablet Take 1,247 mcg by mouth daily.    [provider]  gabapentin (NEURONTIN) 600 MG tablet Take 600 mg by mouth 2 (two) times daily at 8 am and 10 pm.    [provider]  hydroxychloroquine (PLAQUENIL) 200 MG tablet Take 200 mg by mouth 2 (two) times daily.  01/22/16   [provider]  iron polysaccharides (NIFEREX) 150 MG capsule Take 1 capsule (150 mg total) by mouth daily. 10/08/21 01/06/22  Val Riles, MD  ketoconazole (NIZORAL) 2 % cream Apply 1 application topically 2 (two) times daily. 02/12/21   Vaillancourt, Aldona Bar, PA-C  ketoconazole (NIZORAL) 2 % shampoo Apply 1 application topically 2 (two) times a week. 06/15/20   [provider]  methotrexate (RHEUMATREX) 2.5 MG tablet Take 10 tablets by mouth once a week. Taken on Wednesdays 07/01/19   [provider]  metoprolol succinate (TOPROL-XL) 25 MG 24 hr tablet Take 0.5 tablets (12.5 mg total) by mouth 2 (two) times daily. 10/19/21   Isla Pence, MD  milk thistle 175 MG tablet Take 175 mg by mouth daily.    [provider]  MYRBETRIQ 50 MG TB24 tablet TAKE 1 TABLET(50 MG) BY MOUTH DAILY Patient taking differently: Take 50 mg by mouth. 09/09/21   Hollice Espy, MD   Omega-3 Fatty Acids (FISH OIL) 1200 MG CAPS Take 1,200 mg by mouth daily.    [provider]  omeprazole (PRILOSEC) 20  MG capsule Take 1 capsule (20 mg total) by mouth daily as needed. 02/09/21   [name], MD  ondansetron (ZOFRAN) 4 MG tablet Take 1 tablet (4 mg total) by mouth every 6 (six) hours as needed for nausea. 09/17/21   Raiford Noble Latif, DO  polyethylene glycol powder (GLYCOLAX/MIRALAX) 17 GM/SCOOP powder Take 17 g by mouth at bedtime.    [provider]  pravastatin (PRAVACHOL) 20 MG tablet TAKE 1 TABLET(20 MG) BY MOUTH AT BEDTIME Patient taking differently: Take 20 mg by mouth daily. 01/18/21   [name], MD  predniSONE (DELTASONE) 10 MG tablet Take 1 tablet (10 mg total) by mouth daily. 40 mg daily for 1 day, 30 mg daily for 1 day, 20 mg p.o. daily for 1 day, 10 mg daily for 1 day. 10/07/21   Val Riles, MD  Probiotic Product (ALIGN) 4 MG CAPS Take 1 capsule (4 mg total) by mouth daily. Patient not taking: No sig reported 12/15/16   Nicholes Mango, MD  Selenium 200 MCG CAPS Take 200 mcg by mouth daily.     [provider]  traZODone (DESYREL) 50 MG tablet Take 50 mg by mouth at bedtime. 08/12/21   [provider]  triamcinolone ointment (KENALOG) 0.1 % Apply 1 application topically as needed. 10/06/20   [provider]  Turmeric 500 MG CAPS Take 500 mg by mouth daily.     [provider]  vitamin B-12 (CYANOCOBALAMIN) 1000 MCG tablet Take 1,000 mcg by mouth daily.    [provider]  Zinc 50 MG TABS Take by mouth.    [provider]    Allergies    Chlorhexidine, Ciprofloxacin, Citalopram hydrobromide, Clindamycin, Lorazepam, Paroxetine, Ramipril, Simvastatin, and Sulfa antibiotics  Review of Systems   Review of Systems  Genitourinary:  Positive for hematuria.  All other systems reviewed and are negative.  Physical Exam Updated Vital Signs BP (!) 188/78   Pulse 64   Temp 98.1 F (36.7  C) (Oral)   Resp 18   Ht 5\' 9"  (1.753 m)   Wt 95.3 kg   SpO2 98%   BMI 31.01 kg/m   Physical Exam Vitals and nursing note reviewed.  Constitutional:      Appearance: Normal appearance.  HENT:     Head: Normocephalic and atraumatic.     Right Ear: External ear normal.     Left Ear: External ear normal.     Nose: Nose normal.     Mouth/Throat:     Mouth: Mucous membranes are moist.     Pharynx: Oropharynx is clear.  Eyes:     Extraocular Movements: Extraocular movements intact.     Conjunctiva/sclera: Conjunctivae normal.     Pupils: Pupils are equal, round, and reactive to light.  Cardiovascular:     Rate and Rhythm: Normal rate and regular rhythm.     Pulses: Normal pulses.     Heart sounds: Normal heart sounds.  Pulmonary:     Effort: Pulmonary effort is normal.     Breath sounds: Normal breath sounds.  Abdominal:     General: Abdomen is flat. Bowel sounds are normal.     Palpations: Abdomen is soft.  Genitourinary:    Comments: Indwelling FC with gross hematuria Musculoskeletal:        General: Normal range of motion.     Cervical back: Normal range of motion and neck supple.  Skin:    General: Skin is warm.     Capillary Refill:  Capillary refill takes less than 2 seconds.  Neurological:     General: No focal deficit present.     Mental Status: He is alert and oriented to person, place, and time.  Psychiatric:        Mood and Affect: Mood normal.        Behavior: Behavior normal.    ED Results / Procedures / Treatments   Labs (all labs ordered are listed, but only abnormal results are displayed) Labs Reviewed  BASIC METABOLIC PANEL - Abnormal; Notable for the following components:      Result Value   Sodium 134 (*)    Potassium 5.7 (*)    Glucose, Bld 105 (*)    Calcium 8.2 (*)    Anion gap 4 (*)    All other components within normal limits  CBC WITH DIFFERENTIAL/PLATELET - Abnormal; Notable for the following components:   RBC 3.32 (*)    Hemoglobin  11.2 (*)    HCT 33.9 (*)    MCV 102.1 (*)    Platelets 112 (*)    All other components within normal limits  URINALYSIS, ROUTINE W REFLEX MICROSCOPIC - Abnormal; Notable for the following components:   Color, Urine RED (*)    APPearance TURBID (*)    Glucose, UA   (*)    Value: TEST NOT REPORTED DUE TO COLOR INTERFERENCE OF URINE PIGMENT   Hgb urine dipstick   (*)    Value: TEST NOT REPORTED DUE TO COLOR INTERFERENCE OF URINE PIGMENT   Bilirubin Urine   (*)    Value: TEST NOT REPORTED DUE TO COLOR INTERFERENCE OF URINE PIGMENT   Ketones, ur   (*)    Value: TEST NOT REPORTED DUE TO COLOR INTERFERENCE OF URINE PIGMENT   Protein, ur   (*)    Value: TEST NOT REPORTED DUE TO COLOR INTERFERENCE OF URINE PIGMENT   Nitrite   (*)    Value: TEST NOT REPORTED DUE TO COLOR INTERFERENCE OF URINE PIGMENT   Leukocytes,Ua   (*)    Value: TEST NOT REPORTED DUE TO COLOR INTERFERENCE OF URINE PIGMENT   RBC / HPF >50 (*)    Bacteria, UA FEW (*)    All other components within normal limits  URINE CULTURE    EKG None  Radiology CT Renal Stone Study  Result Date: 10/19/2021 CLINICAL DATA:  Hematuria, unknown cause. Bladder pain. Chronic Foley catheter. EXAM: CT ABDOMEN AND PELVIS WITHOUT CONTRAST TECHNIQUE: Multidetector CT imaging of the abdomen and pelvis was performed following the standard protocol without IV contrast. COMPARISON:  Abdominopelvic CT 01/18/2021 and 01/17/2020. FINDINGS: Lower chest: Interval increased streaky opacity in the right lower lobe. Chronic left pleural thickening and left lower lobe scarring have not significantly changed. No significant pleural or pericardial effusion. Atherosclerosis of the aorta and coronary arteries. Prominent calcifications of the aortic valve and mitral annulus are again noted. Hepatobiliary: The liver appears unremarkable as imaged in the noncontrast state. No evidence of gallstones, gallbladder wall thickening or biliary dilatation. Pancreas:  Unremarkable. No pancreatic ductal dilatation or surrounding inflammatory changes. Spleen: Normal in size without focal abnormality on noncontrast imaging. Adrenals/Urinary Tract: Both adrenal glands appear normal. Status post right nephrectomy without mass in the nephrectomy bed. The left kidney appears unchanged. There is no evidence of urinary tract calculus, hydronephrosis or perinephric soft tissue stranding. There is minimal prominence of both distal ureters. Foley catheter is in place with a small amount of high density adjacent to the retention balloon,  possibly reflecting blood clot. There is gas in the bladder attributed to the fully. No bladder wall thickening or surrounding inflammation identified. Stomach/Bowel: No enteric contrast administered. The stomach appears unremarkable for its degree of distension. No evidence of bowel wall thickening, distention or surrounding inflammatory change. The appendix appears normal. Moderate stool throughout the colon. Mild sigmoid colon diverticular changes. Vascular/Lymphatic: There are no enlarged abdominal or pelvic lymph nodes. Diffuse aortic and branch vessel atherosclerosis without aneurysm or acute vascular findings on noncontrast imaging. Reproductive: Status post prostatectomy and pelvic lymphadenectomy. Other: No evidence of abdominal wall mass or hernia. Probable right inguinal herniorrhaphy changes. No ascites. Musculoskeletal: No acute or significant osseous findings. Multilevel thoracolumbar spondylosis associated with a mild scoliosis. IMPRESSION: 1. No explanation for hematuria identified. There is a small amount of high density material within the bladder lumen adjacent to the Foley catheter which may reflect blood clot. Correlate with urine analysis. For persistent unexplained hematuria, further evaluation with cystoscopy may be warranted. 2. Mild dilatation of both distal ureters without urinary tract calculus or surrounding soft tissue stranding.  Previous right nephrectomy. The left kidney appears unremarkable. 3. Interval increased density dependently at the right lung base, likely scarring or atelectasis. Left basilar scarring appears unchanged. Suggest radiographic follow-up. 4.  Aortic Atherosclerosis (ICD10-I70.0). Electronically Signed   By: Richardean Sale M.D.   On: 10/19/2021 12:58    Procedures Procedures   Medications Ordered in ED Medications  metoprolol tartrate (LOPRESSOR) tablet 12.5 mg (has no administration in time range)  cephALEXin (KEFLEX) capsule 500 mg (has no administration in time range)  sodium chloride 0.9 % bolus 1,000 mL (0 mLs Intravenous Stopped 10/19/21 1349)  sodium phosphate (FLEET) 7-19 GM/118ML enema 1 enema (1 enema Rectal Given 10/19/21 1517)    ED Course  I have reviewed the triage vital signs and the nursing notes.  Pertinent labs & imaging results that were available during my care of the patient were reviewed by me and considered in my medical decision making (see chart for details).    MDM Rules/Calculators/A&P                           Pt does not have definite UTI, but he does have some dysuria with the hematuria, so I will start him on keflex.  Urine sent for culture.  CT renal did not show an explanation for hematuria.  Pt does have a urologist in [place].  He knows to f/u with urology for a cystoscopy if bleeding does not resolve.  Pt's daughter noted that the facility has been keeping him in the large foley bag when they are doing his rehab.  That may be causing the foley balloon to irritate the lining of the bladder.  Therefore, I have asked the facility to change to a leg bag when he does his rehab exercises.  I will have pt hold Eliquis for 2 days.  Pt also notes that he's been constipated for 3 days, so a fleet enema is given to pt.  Pt had a large bm.  Pt also notes that he was on metoprolol bid and it was somehow just changed to once a day at d/c.  His bps have been  elevated, so he is asking that I put him back on bid.  This is done.     Final Clinical Impression(s) / ED Diagnoses Final diagnoses:  Gross hematuria  Constipation, unspecified constipation type    Rx / DC  Orders ED Discharge Orders          Ordered    cephALEXin (KEFLEX) 500 MG capsule  4 times daily        10/19/21 1456    metoprolol succinate (TOPROL-XL) 25 MG 24 hr tablet  2 times daily        10/19/21 1456             Isla Pence, MD 10/19/21 1520

## 2021-10-19 NOTE — Telephone Encounter (Signed)
Pt called to let us know his urine in catheter bag is really dark.  He isn't having any other symptoms.  He would like for you to give him a call.  He also stated he would have dr at facility take a look, when they arrive.

## 2021-10-19 NOTE — ED Notes (Addendum)
Patient transported to CT 

## 2021-10-20 ENCOUNTER — Telehealth: Payer: Self-pay | Admitting: Urology

## 2021-10-20 DIAGNOSIS — L89153 Pressure ulcer of sacral region, stage 3: Secondary | ICD-10-CM | POA: Diagnosis not present

## 2021-10-20 NOTE — Telephone Encounter (Signed)
Attempted to reach patient, no answer.

## 2021-10-20 NOTE — Telephone Encounter (Signed)
Pt called in wanting to know if Dr. Erlene Quan can look at his notes from his ED visit from yesterday. He said they told him he would need a cysto and he wants Dr. Erlene Quan or her ma's to let him know. He requested a call back. I did inform him he has an upcoming appt on 12/8 with Sam.

## 2021-10-20 NOTE — Telephone Encounter (Signed)
Attempted to call patient unable to leave VM.

## 2021-10-23 LAB — URINE CULTURE: Culture: >=100000 — AB

## 2021-10-24 ENCOUNTER — Telehealth: Payer: Self-pay | Admitting: Emergency Medicine

## 2021-10-24 NOTE — Progress Notes (Signed)
ED Antimicrobial Stewardship Positive Culture Follow Up   [name] is an 87 y.o. male who presented to [facility] on 10/19/2021 with a chief complaint of  Chief Complaint  Patient presents with   Hematuria    Recent Results (from the past 720 hour(s))  Respiratory (~20 pathogens) panel by PCR     Status: None   Collection Time: 10/05/21  5:47 PM   Specimen: Nasopharyngeal Swab; Respiratory  Result Value Ref Range Status   Adenovirus NOT DETECTED NOT DETECTED Final   Coronavirus 229E NOT DETECTED NOT DETECTED Final    Comment: (NOTE) The Coronavirus on the Respiratory Panel, DOES NOT test for the novel  Coronavirus (2019 nCoV)    Coronavirus HKU1 NOT DETECTED NOT DETECTED Final   Coronavirus NL63 NOT DETECTED NOT DETECTED Final   Coronavirus OC43 NOT DETECTED NOT DETECTED Final   Metapneumovirus NOT DETECTED NOT DETECTED Final   Rhinovirus / Enterovirus NOT DETECTED NOT DETECTED Final   Influenza A NOT DETECTED NOT DETECTED Final   Influenza B NOT DETECTED NOT DETECTED Final   Parainfluenza Virus 1 NOT DETECTED NOT DETECTED Final   Parainfluenza Virus 2 NOT DETECTED NOT DETECTED Final   Parainfluenza Virus 3 NOT DETECTED NOT DETECTED Final   Parainfluenza Virus 4 NOT DETECTED NOT DETECTED Final   Respiratory Syncytial Virus NOT DETECTED NOT DETECTED Final   Bordetella pertussis NOT DETECTED NOT DETECTED Final   Bordetella Parapertussis NOT DETECTED NOT DETECTED Final   Chlamydophila pneumoniae NOT DETECTED NOT DETECTED Final   Mycoplasma pneumoniae NOT DETECTED NOT DETECTED Final    Comment: Performed at [facility], [place]. [address] [place], [place] [address]  Urine Culture     Status: Abnormal   Collection Time: 10/19/21 12:59 PM   Specimen: Urine, Catheterized  Result Value Ref Range Status   Specimen Description   Final    URINE, CATHETERIZED Performed at [facility] [address], [place], [place] [address]    Special Requests   Final     NONE Performed at [facility], [place] [address], [place], [place] [address]    Culture (A)  Final    >=100,000 COLONIES/mL PSEUDOMONAS AERUGINOSA >=100,000 COLONIES/mL ENTEROCOCCUS FAECALIS    Report Status 10/23/2021 FINAL  Final   Organism ID, Bacteria PSEUDOMONAS AERUGINOSA (A)  Final   Organism ID, Bacteria ENTEROCOCCUS FAECALIS (A)  Final      Susceptibility   Enterococcus faecalis - MIC*    AMPICILLIN <=2 SENSITIVE Sensitive     NITROFURANTOIN <=16 SENSITIVE Sensitive     VANCOMYCIN 1 SENSITIVE Sensitive     * >=100,000 COLONIES/mL ENTEROCOCCUS FAECALIS   Pseudomonas aeruginosa - MIC*    CEFTAZIDIME 4 SENSITIVE Sensitive     CIPROFLOXACIN <=0.25 SENSITIVE Sensitive     GENTAMICIN <=1 SENSITIVE Sensitive     IMIPENEM 2 SENSITIVE Sensitive     PIP/TAZO <=4 SENSITIVE Sensitive     CEFEPIME 2 SENSITIVE Sensitive     * >=100,000 COLONIES/mL PSEUDOMONAS AERUGINOSA    [x]  Treated with Cephalexin, organism resistant to prescribed antimicrobial  Plan:  Perform symptom check. If patient still having any urinary symptoms, have patient follow-up with his primary urologist.  Fax these urine culture results to patient's primary urologist at [facility] to review and decide if further treatment is warranted (or if this could just represent colonization).   ED Provider: Dr. Wynona Dove   Lindell Spar M 10/24/2021, 1:02 PM Clinical Pharmacist [phone]

## 2021-10-24 NOTE — Telephone Encounter (Signed)
Post ED Visit - Positive Culture Follow-up: Unsuccessful Patient Follow-up  Culture assessed and recommendations reviewed by:  []  Elenor Quinones, Pharm.D. []  Heide Guile, Pharm.D., BCPS AQ-ID []  Parks Neptune, Pharm.D., BCPS []  Alycia Rossetti, Pharm.D., BCPS []  [place], [place].D., BCPS, AAHIVP []  Legrand Como, Pharm.D., BCPS, AAHIVP [x]  Lindell Spar, PharmD []  Vincenza Hews, PharmD, BCPS  Positive urine culture  []  Patient discharged without antimicrobial prescription and treatment is now indicated []  Organism is resistant to prescribed ED discharge antimicrobial []  Patient with positive blood cultures   Unable to contact patient by phone, left voicemail, letter will be sent to address on file  Plan:  Symptom check - if still having any urinary symptoms have pt follow-up with primary urologist. Fax these urine culture result to patient's primary urologist (Dr Erlene Quan w/ [facility]) to review and decide if further treatment warranted (or if this is colonization).  10/24/21: Result faxed to [facility].   Sandi Raveling Teairra Millar 10/24/2021, 5:21 PM

## 2021-10-26 ENCOUNTER — Ambulatory Visit: Payer: Self-pay

## 2021-10-27 ENCOUNTER — Other Ambulatory Visit: Payer: Self-pay

## 2021-10-27 ENCOUNTER — Ambulatory Visit (INDEPENDENT_AMBULATORY_CARE_PROVIDER_SITE_OTHER): Payer: Medicare PPO

## 2021-10-27 VITALS — BP 91/47 | HR 76 | Temp 98.1°F | Resp 18

## 2021-10-27 DIAGNOSIS — K5900 Constipation, unspecified: Secondary | ICD-10-CM | POA: Diagnosis not present

## 2021-10-27 DIAGNOSIS — R0989 Other specified symptoms and signs involving the circulatory and respiratory systems: Secondary | ICD-10-CM | POA: Diagnosis not present

## 2021-10-27 DIAGNOSIS — J4550 Severe persistent asthma, uncomplicated: Secondary | ICD-10-CM

## 2021-10-27 DIAGNOSIS — R062 Wheezing: Secondary | ICD-10-CM | POA: Diagnosis not present

## 2021-10-27 DIAGNOSIS — J4540 Moderate persistent asthma, uncomplicated: Secondary | ICD-10-CM | POA: Diagnosis not present

## 2021-10-27 DIAGNOSIS — L89153 Pressure ulcer of sacral region, stage 3: Secondary | ICD-10-CM | POA: Diagnosis not present

## 2021-10-27 DIAGNOSIS — J45901 Unspecified asthma with (acute) exacerbation: Secondary | ICD-10-CM | POA: Diagnosis not present

## 2021-10-27 MED ORDER — EPINEPHRINE 0.3 MG/0.3ML IJ SOAJ: 0.3000 mg | Freq: Once | INTRAMUSCULAR | Status: DC | PRN

## 2021-10-27 MED ORDER — SODIUM CHLORIDE 0.9 % IV SOLN: Freq: Once | INTRAVENOUS | Status: DC | PRN

## 2021-10-27 MED ORDER — DIPHENHYDRAMINE HCL 50 MG/ML IJ SOLN: 50.0000 mg | Freq: Once | INTRAMUSCULAR | Status: DC | PRN

## 2021-10-27 MED ORDER — OMALIZUMAB 75 MG/0.5ML ~~LOC~~ SOSY
375.0000 mg | PREFILLED_SYRINGE | Freq: Once | SUBCUTANEOUS | Status: AC
  Administered 2021-10-27: 375 mg via SUBCUTANEOUS

## 2021-10-27 MED ORDER — FAMOTIDINE IN NACL 20-0.9 MG/50ML-% IV SOLN: 20.0000 mg | Freq: Once | INTRAVENOUS | Status: DC | PRN

## 2021-10-27 MED ORDER — ALBUTEROL SULFATE HFA 108 (90 BASE) MCG/ACT IN AERS: 2.0000 | INHALATION_SPRAY | Freq: Once | RESPIRATORY_TRACT | Status: DC | PRN

## 2021-10-27 MED ORDER — METHYLPREDNISOLONE SODIUM SUCC 125 MG IJ SOLR: 125.0000 mg | Freq: Once | INTRAMUSCULAR | Status: DC | PRN

## 2021-10-27 NOTE — Progress Notes (Signed)
Diagnosis: Asthma  Provider:  Marshell Garfinkel, MD  Procedure: Infusion  Xolair (Omalizumab), Dose: 375 mg, Site: subcutaneous  Discharge: Condition: Good, Destination: Home . AVS provided to patient.   Performed by:  Arnoldo Morale, RN

## 2021-11-01 ENCOUNTER — Ambulatory Visit: Payer: Medicare PPO

## 2021-11-01 NOTE — Progress Notes (Signed)
Diagnosis: Asthma  Provider:  Marshell Garfinkel, MD  Procedure: Infusion  Xolair (Omalizumab), Dose: 375 mg, Site: subcutaneous - 3 separate injections  Discharge: Condition: Good, Destination: Home . AVS provided to patient.   Performed by:  Sabitri Museum/gallery curator

## 2021-11-04 ENCOUNTER — Telehealth: Payer: Self-pay | Admitting: Pharmacy Technician

## 2021-11-04 ENCOUNTER — Ambulatory Visit (INDEPENDENT_AMBULATORY_CARE_PROVIDER_SITE_OTHER): Payer: Medicare PPO | Admitting: Physician Assistant

## 2021-11-04 ENCOUNTER — Other Ambulatory Visit: Payer: Self-pay

## 2021-11-04 DIAGNOSIS — J3089 Other allergic rhinitis: Secondary | ICD-10-CM | POA: Diagnosis not present

## 2021-11-04 DIAGNOSIS — R0981 Nasal congestion: Secondary | ICD-10-CM | POA: Diagnosis not present

## 2021-11-04 DIAGNOSIS — N319 Neuromuscular dysfunction of bladder, unspecified: Secondary | ICD-10-CM | POA: Diagnosis not present

## 2021-11-04 DIAGNOSIS — J4550 Severe persistent asthma, uncomplicated: Secondary | ICD-10-CM | POA: Diagnosis not present

## 2021-11-04 NOTE — Telephone Encounter (Signed)
Auth Submission:PA RENEWAL - PENDING Payer: [employer] Medication & CPT/J Code(s) submitted: Xolair (Omalizumab) [id number] Route of submission (phone, fax, portal): COVER MY MEDS Auth type: Buy/Bill Units/visits requested: 375MG  Q14 DAYS Reference number: KEY: [id number]: [id number]  Will update once we receive a response.

## 2021-11-04 NOTE — Progress Notes (Signed)
Cath Change/ Replacement  Patient is present today for a catheter change due to urinary retention.  51ml of water was removed from the balloon, a 16FR foley cath was removed without difficulty.  Patient was cleaned and prepped in a sterile fashion with betadine and 2% lidocaine jelly was instilled into the urethra. A 16 FR Silastic foley cath was replaced into the bladder no complications were noted Urine return was noted 107ml and urine was yellow in color. The balloon was filled with 68ml of sterile water. A night bag was attached for drainage.  Patient tolerated well.    Performed by: Debroah Loop, PA-C, Gaspar Cola, CMA, and Bradly Bienenstock, CMA  Follow up: Return in about 4 weeks (around 12/02/2021) for Catheter exchange.

## 2021-11-05 DIAGNOSIS — R0989 Other specified symptoms and signs involving the circulatory and respiratory systems: Secondary | ICD-10-CM | POA: Diagnosis not present

## 2021-11-05 DIAGNOSIS — R0981 Nasal congestion: Secondary | ICD-10-CM | POA: Diagnosis not present

## 2021-11-05 DIAGNOSIS — R051 Acute cough: Secondary | ICD-10-CM | POA: Diagnosis not present

## 2021-11-05 DIAGNOSIS — J4550 Severe persistent asthma, uncomplicated: Secondary | ICD-10-CM | POA: Diagnosis not present

## 2021-11-05 NOTE — Telephone Encounter (Addendum)
Auth Submission: APPROVED  (Approved unlimited units) Payer: [employer] Medication & CPT/J Code(s) submitted: Xolair (Omalizumab) [id number] Route of submission (phone, fax, portal): 375 MG Q14 DAYS Auth type: Pharmacy Benefit Units/visits requested: [place] Reference number: [id number] CASE: [id number] Approval from: 01/22/21 to 01/22/22

## 2021-11-07 ENCOUNTER — Other Ambulatory Visit: Payer: Self-pay | Admitting: Cardiovascular Disease

## 2021-11-08 DIAGNOSIS — R062 Wheezing: Secondary | ICD-10-CM | POA: Diagnosis not present

## 2021-11-08 DIAGNOSIS — R051 Acute cough: Secondary | ICD-10-CM | POA: Diagnosis not present

## 2021-11-08 DIAGNOSIS — J4550 Severe persistent asthma, uncomplicated: Secondary | ICD-10-CM | POA: Diagnosis not present

## 2021-11-08 DIAGNOSIS — R0989 Other specified symptoms and signs involving the circulatory and respiratory systems: Secondary | ICD-10-CM | POA: Diagnosis not present

## 2021-11-08 NOTE — Telephone Encounter (Signed)
Patient at rehab unsure of dc will call back per daughter

## 2021-11-08 NOTE — Telephone Encounter (Signed)
Please schedule 6 month F/U appointment. Thank you! 

## 2021-11-09 ENCOUNTER — Ambulatory Visit: Payer: Self-pay

## 2021-11-09 DIAGNOSIS — R062 Wheezing: Secondary | ICD-10-CM | POA: Diagnosis not present

## 2021-11-09 DIAGNOSIS — J4550 Severe persistent asthma, uncomplicated: Secondary | ICD-10-CM | POA: Diagnosis not present

## 2021-11-09 DIAGNOSIS — R0989 Other specified symptoms and signs involving the circulatory and respiratory systems: Secondary | ICD-10-CM | POA: Diagnosis not present

## 2021-11-09 DIAGNOSIS — J189 Pneumonia, unspecified organism: Secondary | ICD-10-CM | POA: Diagnosis not present

## 2021-11-09 DIAGNOSIS — R051 Acute cough: Secondary | ICD-10-CM | POA: Diagnosis not present

## 2021-11-10 ENCOUNTER — Telehealth: Payer: Self-pay

## 2021-11-10 ENCOUNTER — Ambulatory Visit: Payer: Medicare PPO

## 2021-11-10 DIAGNOSIS — J4550 Severe persistent asthma, uncomplicated: Secondary | ICD-10-CM | POA: Diagnosis not present

## 2021-11-10 DIAGNOSIS — Z8616 Personal history of COVID-19: Secondary | ICD-10-CM | POA: Diagnosis not present

## 2021-11-10 DIAGNOSIS — J189 Pneumonia, unspecified organism: Secondary | ICD-10-CM | POA: Diagnosis not present

## 2021-11-10 DIAGNOSIS — R0989 Other specified symptoms and signs involving the circulatory and respiratory systems: Secondary | ICD-10-CM | POA: Diagnosis not present

## 2021-11-10 DIAGNOSIS — R051 Acute cough: Secondary | ICD-10-CM | POA: Diagnosis not present

## 2021-11-12 ENCOUNTER — Emergency Department (HOSPITAL_COMMUNITY): Payer: Medicare PPO

## 2021-11-12 ENCOUNTER — Emergency Department (HOSPITAL_COMMUNITY)
Admission: EM | Admit: 2021-11-12 | Discharge: 2021-11-12 | Disposition: A | Discharge status: Home / Self Care | Payer: Medicare PPO | Attending: Emergency Medicine | Admitting: Emergency Medicine

## 2021-11-12 ENCOUNTER — Other Ambulatory Visit: Payer: Self-pay

## 2021-11-12 ENCOUNTER — Encounter (HOSPITAL_COMMUNITY): Payer: Self-pay | Admitting: Emergency Medicine

## 2021-11-12 DIAGNOSIS — Z85528 Personal history of other malignant neoplasm of kidney: Secondary | ICD-10-CM | POA: Diagnosis not present

## 2021-11-12 DIAGNOSIS — I480 Paroxysmal atrial fibrillation: Secondary | ICD-10-CM | POA: Diagnosis not present

## 2021-11-12 DIAGNOSIS — Z79899 Other long term (current) drug therapy: Secondary | ICD-10-CM | POA: Insufficient documentation

## 2021-11-12 DIAGNOSIS — R471 Dysarthria and anarthria: Secondary | ICD-10-CM | POA: Insufficient documentation

## 2021-11-12 DIAGNOSIS — I130 Hypertensive heart and chronic kidney disease with heart failure and stage 1 through stage 4 chronic kidney disease, or unspecified chronic kidney disease: Secondary | ICD-10-CM | POA: Insufficient documentation

## 2021-11-12 DIAGNOSIS — Z96653 Presence of artificial knee joint, bilateral: Secondary | ICD-10-CM | POA: Diagnosis not present

## 2021-11-12 DIAGNOSIS — J4520 Mild intermittent asthma, uncomplicated: Secondary | ICD-10-CM | POA: Insufficient documentation

## 2021-11-12 DIAGNOSIS — N189 Chronic kidney disease, unspecified: Secondary | ICD-10-CM | POA: Insufficient documentation

## 2021-11-12 DIAGNOSIS — I10 Essential (primary) hypertension: Secondary | ICD-10-CM | POA: Diagnosis not present

## 2021-11-12 DIAGNOSIS — J019 Acute sinusitis, unspecified: Secondary | ICD-10-CM | POA: Diagnosis not present

## 2021-11-12 DIAGNOSIS — Z7901 Long term (current) use of anticoagulants: Secondary | ICD-10-CM | POA: Insufficient documentation

## 2021-11-12 DIAGNOSIS — N319 Neuromuscular dysfunction of bladder, unspecified: Secondary | ICD-10-CM | POA: Diagnosis not present

## 2021-11-12 DIAGNOSIS — M6281 Muscle weakness (generalized): Secondary | ICD-10-CM | POA: Diagnosis not present

## 2021-11-12 DIAGNOSIS — Z859 Personal history of malignant neoplasm, unspecified: Secondary | ICD-10-CM | POA: Insufficient documentation

## 2021-11-12 DIAGNOSIS — I639 Cerebral infarction, unspecified: Secondary | ICD-10-CM | POA: Diagnosis not present

## 2021-11-12 DIAGNOSIS — M0579 Rheumatoid arthritis with rheumatoid factor of multiple sites without organ or systems involvement: Secondary | ICD-10-CM | POA: Diagnosis not present

## 2021-11-12 DIAGNOSIS — Z8616 Personal history of COVID-19: Secondary | ICD-10-CM | POA: Diagnosis not present

## 2021-11-12 DIAGNOSIS — I5032 Chronic diastolic (congestive) heart failure: Secondary | ICD-10-CM | POA: Diagnosis not present

## 2021-11-12 DIAGNOSIS — G894 Chronic pain syndrome: Secondary | ICD-10-CM | POA: Diagnosis not present

## 2021-11-12 DIAGNOSIS — I2510 Atherosclerotic heart disease of native coronary artery without angina pectoris: Secondary | ICD-10-CM | POA: Diagnosis not present

## 2021-11-12 DIAGNOSIS — J3489 Other specified disorders of nose and nasal sinuses: Secondary | ICD-10-CM | POA: Diagnosis not present

## 2021-11-12 DIAGNOSIS — J9601 Acute respiratory failure with hypoxia: Secondary | ICD-10-CM | POA: Diagnosis not present

## 2021-11-12 DIAGNOSIS — Z8546 Personal history of malignant neoplasm of prostate: Secondary | ICD-10-CM | POA: Diagnosis not present

## 2021-11-12 DIAGNOSIS — J4550 Severe persistent asthma, uncomplicated: Secondary | ICD-10-CM | POA: Insufficient documentation

## 2021-11-12 DIAGNOSIS — R4781 Slurred speech: Secondary | ICD-10-CM | POA: Diagnosis not present

## 2021-11-12 DIAGNOSIS — G629 Polyneuropathy, unspecified: Secondary | ICD-10-CM | POA: Diagnosis not present

## 2021-11-12 LAB — COMPREHENSIVE METABOLIC PANEL
ALT: 15 U/L (ref 0–44)
AST: 15 U/L (ref 15–41)
Albumin: 2.7 g/dL — ABNORMAL LOW (ref 3.5–5.0)
Alkaline Phosphatase: 46 U/L (ref 38–126)
Anion gap: 7 (ref 5–15)
BUN: 23 mg/dL (ref 8–23)
CO2: 31 mmol/L (ref 22–32)
Calcium: 8.1 mg/dL — ABNORMAL LOW (ref 8.9–10.3)
Chloride: 101 mmol/L (ref 98–111)
Creatinine, Ser: 1.25 mg/dL — ABNORMAL HIGH (ref 0.61–1.24)
GFR, Estimated: 56 mL/min — ABNORMAL LOW (ref >60)
Glucose, Bld: 114 mg/dL — ABNORMAL HIGH (ref 70–99)
Potassium: 4 mmol/L (ref 3.5–5.1)
Sodium: 139 mmol/L (ref 135–145)
Total Bilirubin: 0.8 mg/dL (ref 0.3–1.2)
Total Protein: 5.3 g/dL — ABNORMAL LOW (ref 6.5–8.1)

## 2021-11-12 LAB — DIFFERENTIAL
Abs Immature Granulocytes: 0.11 10*3/uL — ABNORMAL HIGH (ref 0.00–0.07)
Basophils Absolute: 0.1 10*3/uL (ref 0.0–0.1)
Basophils Relative: 1 %
Eosinophils Absolute: 0 10*3/uL (ref 0.0–0.5)
Eosinophils Relative: 0 %
Immature Granulocytes: 1 %
Lymphocytes Relative: 15 %
Lymphs Abs: 1.3 10*3/uL (ref 0.7–4.0)
Monocytes Absolute: 0.5 10*3/uL (ref 0.1–1.0)
Monocytes Relative: 5 %
Neutro Abs: 7.2 10*3/uL (ref 1.7–7.7)
Neutrophils Relative %: 78 %

## 2021-11-12 LAB — APTT: aPTT: 38 seconds — ABNORMAL HIGH (ref 24–36)

## 2021-11-12 LAB — CBC
HCT: 31.8 % — ABNORMAL LOW (ref 39.0–52.0)
Hemoglobin: 10.1 g/dL — ABNORMAL LOW (ref 13.0–17.0)
MCH: 32.7 pg (ref 26.0–34.0)
MCHC: 31.8 g/dL (ref 30.0–36.0)
MCV: 102.9 fL — ABNORMAL HIGH (ref 80.0–100.0)
Platelets: 117 10*3/uL — ABNORMAL LOW (ref 150–400)
RBC: 3.09 MIL/uL — ABNORMAL LOW (ref 4.22–5.81)
RDW: 15.2 % (ref 11.5–15.5)
WBC: 9.2 10*3/uL (ref 4.0–10.5)
nRBC: 0 % (ref 0.0–0.2)

## 2021-11-12 LAB — PROTIME-INR
INR: 1.5 — ABNORMAL HIGH (ref 0.8–1.2)
Prothrombin Time: 18.2 seconds — ABNORMAL HIGH (ref 11.4–15.2)

## 2021-11-12 LAB — ETHANOL: Alcohol, Ethyl (B): <10 mg/dL (ref <10)

## 2021-11-12 NOTE — ED Notes (Signed)
Attempted report to [employer].

## 2021-11-12 NOTE — ED Notes (Signed)
Breakfast ordered 

## 2021-11-12 NOTE — Discharge Instructions (Signed)
Return for any problem.   Continue to take Eliquis as previously prescribed.  Follow-up with your regular neurologist in the outpatient setting.

## 2021-11-12 NOTE — ED Notes (Signed)
Attempted report to [employer].

## 2021-11-12 NOTE — ED Provider Notes (Signed)
Patient seen after prior EDP.  MR images are without evidence of acute infarct.  Case reviewed with Dr. Malen Gauze of neurology.  He agrees with plan for discharge.  Patient should continue Eliquis as previously prescribed.  Close outpatient follow-up with the patient's primary neurologist in [place] is recommended.  Importance of close follow-up was stressed.  Strict return precautions given and understood.   Valarie Merino, MD 11/12/21 [phone]

## 2021-11-12 NOTE — ED Notes (Signed)
This RN offered pt breakfast, pt refused, stating he could just wait.

## 2021-11-12 NOTE — ED Triage Notes (Signed)
BIB EMS from [facility] for initial complaint of speech changes. Staff notes no change to patients speech. On triage eval pt has only complaint of dry mouth since starting to take anbx recently and he would like to know if there is a mouthwash or something he could get as water doesn't help.

## 2021-11-12 NOTE — ED Notes (Signed)
Pt verbalizes understanding of discharge instructions. Opportunity for questions and answers were provided. Pt discharged from the ED via [place].

## 2021-11-12 NOTE — ED Provider Notes (Addendum)
[facility] EMERGENCY DEPARTMENT Provider Note   CSN: [id number] Arrival date & time: 11/12/21  0249     History Chief Complaint  Patient presents with   Dry Mouth    [name] is a 87 y.o. male.  HPI     87yo male with history of CHF, basal cell carcinoma, RA, CKD, CAD, atrial fibrillation on eliquis, hypertension, TIA, who presents from [facility] with concern for episode of dysarthria.    He reports being treated with rocephin for pneumonia with improvement in his cough and other symptoms but feels like the antibiotics are causing dry mouth.  He went to sleep around 10PM then woke up at 2AM and had slurred speech.  He reports it was present when he woke up but that it was more than just still being sleepy and talking--that it was more specifically that his speech was slurred, and also seemed unrelated to his sensation of dry mouth.  The slurred speech lasted about 7 minutes then completely resolved. He feels he is back to baseline now.  He has no new weakness, numbness, change in vision or other concerns.  Has had history of TIA in the past but denies having episode like this.  He is to be discharged from [facility] back home this week if he continues to improve from rehab standpoint.    Past Medical History:  Diagnosis Date   (HFpEF) heart failure with preserved ejection fraction ([place])    a. 02/2019 Echo: Ef 60-65%, mildly reduced RV fxn. RVSP 42mmHg. Mildly dil LA. Mod dil RA. Mild to mod TR. Mild to mod AS. Triv AI.   Allergy    Anemia    Anxiety    Arthritis    rheumatoid   Asthma    Basal cell carcinoma    back   CAD (coronary artery disease)    CHF (congestive heart failure) (HCC)    Chronic kidney disease    Mass right kidney   Chronic sinusitis    Collagen vascular disease (HCC)    Rheumatoid Arthritis   Depression    Diverticulitis    Dysrhythmia    ED (erectile dysfunction)    GERD (gastroesophageal reflux disease)    History  of SIADH    Hx of adenomatous colonic polyps    Hypertension    Hyponatremia    IBS (irritable bowel syndrome)    Neurodermatitis    Neurogenic bladder    OSA (obstructive sleep apnea)    no CPAP since weight loss   Persistent atrial fibrillation ([place])    a. Dx 02/2019; b. CHA2DS2VASc = 6-->eliquis initiated.   Prostate cancer [facility])    Renal mass    10/2018 4.2cm R renal cortical mass. Most compatible w/ clear cell renal cell carcinoma.    Patient Active Problem List   Diagnosis Date Noted   Asthma exacerbation 10/05/2021   PAF (paroxysmal atrial fibrillation) ([place]) 10/05/2021   Acute respiratory failure with hypoxia ([place]) 10/05/2021   Sacral decubitus ulcer, stage II ([place]) 10/05/2021   COVID-19 virus infection 09/19/2021   E coli bacteremia    Acute kidney injury superimposed on CKD ([place]) 09/11/2021   Hypotension    Thrombocytopenia (HCC)    Chronic diastolic CHF (congestive heart failure) ([place])    Weakness    Neuropathy    Severe persistent asthma 02/05/2021   Postprocedure pain management follow-up 12/08/2020   Spondylosis without myelopathy or radiculopathy, lumbosacral region 11/24/2020   Elevated C-reactive protein (CRP) 11/24/2020  Elevated sed rate 11/24/2020   Chronic pain syndrome 11/04/2020   Pharmacologic therapy 11/04/2020   Disorder of skeletal system 11/04/2020   Problems influencing health status 11/04/2020   Abnormal MRI, lumbar spine (09/21/2020) 11/04/2020   Chronic low back pain (Bilateral) w/o sciatica 11/04/2020   DDD (degenerative disc disease), lumbosacral 11/04/2020   Lumbosacral foraminal stenosis 11/04/2020   Lumbar facet hypertrophy 11/04/2020   Levoscoliosis of lumbosacral spine 11/04/2020   Grade 1 Retrolisthesis at L2-3 and L3-4 11/04/2020   Chronic anticoagulation (Eliquis) 11/04/2020   Lumbar facet syndrome 11/04/2020   Long term prescription benzodiazepine use 11/04/2020   Chronic shoulder pain (Bilateral) 11/04/2020   Chronic elbow  pain (Bilateral) 11/04/2020   Chronic wrist pain (Bilateral) 11/04/2020   Chronic sinusitis 07/12/2020   Persistent atrial fibrillation ([place]) 07/12/2020   Pancytopenia ([place]) 09/12/2019   Edema of lower extremity 09/12/2019   Hyposmolality and/or hyponatremia 09/12/2019   Renal cell carcinoma ([place]) 07/11/2019   AF (paroxysmal atrial fibrillation) ([place]) 05/09/2019   History of TIA (transient ischemic attack) 12/06/2018   Vision changes 12/06/2018   TIA (transient ischemic attack) 06/06/2018   Carotid artery disease ([place]) 06/06/2018   Frailty 04/24/2018   Long term current use of immunosuppressive drug 04/24/2018   Obesity (BMI 30-39.9) 04/24/2018   Fever 11/02/2017   SIADH (syndrome of inappropriate ADH production) ([place]) 04/17/2017   AKI (acute kidney injury) ([place]) 04/03/2017   Hyponatremia 01/16/2017   Urinary tract infection associated with indwelling urethral catheter ([place]) 12/19/2016   Asthma with acute exacerbation 11/14/2016   Mild intermittent asthma without complication 48/27/0786   Mood disorder ([place]) 01/18/2016   Presence of indwelling urinary catheter 08/20/2015   Advanced directives, counseling/discussion 07/28/2014   Routine general medical examination at a health care facility 04/09/2012   Venous stasis dermatitis 06/13/2011   Sleep disturbance 12/21/2010   CONSTIPATION, CHRONIC 12/07/2010   Chronic constipation 12/07/2010   Hyperlipidemia 12/02/2010   OSTEOARTHRITIS 12/02/2010   Neurogenic bladder 05/24/2010   IBS 05/05/2010   History of colonic polyps 05/05/2010   NEUROPATHY 05/03/2010   Mononeuritis 05/03/2010   Personal history of prostate cancer 03/04/2010   Benign essential hypertension 03/25/2009   GAD (generalized anxiety disorder) 01/27/2007   Coronary atherosclerosis of native coronary artery 01/27/2007   Allergic asthma 01/27/2007   GERD 01/27/2007   Chronic rheumatic arthritis ([place]) 01/27/2007   Rheumatoid arthritis involving multiple sites with  positive rheumatoid factor ([place]) 01/27/2007    Past Surgical History:  Procedure Laterality Date   CARDIOVERSION N/A 04/23/2019   Procedure: CARDIOVERSION;  Surgeon: Nelva Bush, MD;  Location: [facility] ORS;  Service: Cardiovascular;  Laterality: N/A;   KNEE ARTHROPLASTY Right 09/02/2015   Procedure: COMPUTER ASSISTED TOTAL KNEE ARTHROPLASTY;  Surgeon: [name], MD;  Location: [facility] ORS;  Service: Orthopedics;  Laterality: Right;   LAPAROSCOPIC NEPHRECTOMY, HAND ASSISTED Right 06/17/2019   Procedure: HAND ASSISTED LAPAROSCOPIC NEPHRECTOMY;  Surgeon: Hollice Espy, MD;  Location: [facility] ORS;  Service: Urology;  Laterality: Right;   NASAL SINUS SURGERY  2009   DEVIATED SEPTUM AND POLYPS   permanent indwelling catheter     PROSTATE SURGERY     PROSTATECTOMY   TOTAL KNEE ARTHROPLASTY Left 9/15   Dr [name]   URETHRAL STRICTURE DILATATION  02-2010   Dr.Cope       Family History  Problem Relation Age of Onset   Heart disease Mother 54       heart failure   Heart failure Mother    Pneumonia Father 28  pnemonia   Skin cancer Father    Skin cancer Sister    Skin cancer Son    Colon cancer Neg Hx    Esophageal cancer Neg Hx    Stomach cancer Neg Hx    Pancreatic cancer Neg Hx    Liver disease Neg Hx     Social History   Tobacco Use   Smoking status: Never   Smokeless tobacco: Never  Vaping Use   Vaping Use: Never used  Substance Use Topics   Alcohol use: Yes    Comment: 5 days out of 7 drinks scotch   Drug use: No    Home Medications Prior to Admission medications   Medication Sig Start Date End Date Taking? Authorizing Provider  acetaminophen (TYLENOL) 500 MG tablet Take 500 mg by mouth 2 (two) times daily. May take an additional 500mg  as needed for arthritis    [provider]  albuterol (VENTOLIN HFA) 108 (90 Base) MCG/ACT inhaler Inhale 2 puffs into the lungs every 6 (six) hours as needed for wheezing or shortness of breath. 10/07/21   Val Riles,  MD  ALOE PO Take 400 mg by mouth daily.     [provider]  ALPRAZolam Duanne Moron) 0.5 MG tablet Take 1 tablet (0.5 mg total) by mouth at bedtime. 10/07/21   Val Riles, MD  apixaban (ELIQUIS) 5 MG TABS tablet Take 1 tablet (5 mg total) by mouth 2 (two) times daily. 09/28/21   Max Sane, MD  Ascorbic Acid (VITAMIN C PO) Take 1,000 mg by mouth daily.     [provider]  cephALEXin (KEFLEX) 500 MG capsule Take 1 capsule (500 mg total) by mouth 4 (four) times daily. 10/19/21   Isla Pence, MD  cetirizine (ZYRTEC) 10 MG tablet Take 10 mg by mouth at bedtime.    [provider]  chlorhexidine (PERIDEX) 0.12 % solution 1 mL by Mouth Rinse route as needed. 10/13/20   [provider]  Cholecalciferol (VITAMIN D3) 5000 UNITS CAPS Take 2,000 Units by mouth daily.     [provider]  Coenzyme Q10 (CO Q 10) 100 MG CAPS Take 100 mg by mouth daily.     [provider]  CRANBERRY SOFT PO Take 250 mg by mouth daily.    [provider]  dextromethorphan-guaiFENesin (MUCINEX DM) 30-600 MG 12hr tablet Take 1 tablet by mouth 2 (two) times daily as needed for cough. 10/07/21   Val Riles, MD  EPINEPHrine 0.3 mg/0.3 mL IJ SOAJ injection Inject 0.3 mg into the muscle as needed for anaphylaxis.    [provider]  fluticasone (FLONASE) 50 MCG/ACT nasal spray Place 2 sprays into both nostrils 2 (two) times daily. 04/27/17   [name], MD  folic acid (FOLVITE) 213 MCG tablet Take 1,247 mcg by mouth daily.    [provider]  gabapentin (NEURONTIN) 600 MG tablet Take 600 mg by mouth 2 (two) times daily at 8 am and 10 pm.    [provider]  hydroxychloroquine (PLAQUENIL) 200 MG tablet Take 200 mg by mouth 2 (two) times daily.  01/22/16   [provider]  iron polysaccharides (NIFEREX) 150 MG capsule Take 1 capsule (150 mg total) by mouth daily. 10/08/21 01/06/22  Val Riles, MD  ketoconazole (NIZORAL) 2 % cream  Apply 1 application topically 2 (two) times daily. 02/12/21   Vaillancourt, Aldona Bar, PA-C  ketoconazole (NIZORAL) 2 % shampoo Apply 1 application topically 2 (two) times a week. 06/15/20   [provider]  methotrexate (RHEUMATREX) 2.5 MG tablet Take 10 tablets by mouth once a week. Taken on Wednesdays 07/01/19   [provider]  metoprolol succinate (TOPROL-XL) 25 MG 24 hr tablet Take 0.5 tablets (12.5 mg total) by mouth 2 (two) times daily. 10/19/21   Isla Pence, MD  milk thistle 175 MG tablet Take 175 mg by mouth daily.    [provider]  MYRBETRIQ 50 MG TB24 tablet TAKE 1 TABLET(50 MG) BY MOUTH DAILY Patient taking differently: Take 50 mg by mouth. 09/09/21   Hollice Espy, MD  Omega-3 Fatty Acids (FISH OIL) 1200 MG CAPS Take 1,200 mg by mouth daily.    [provider]  omeprazole (PRILOSEC) 20 MG capsule Take 1 capsule (20 mg total) by mouth daily as needed. 02/09/21   [name], MD  ondansetron (ZOFRAN) 4 MG tablet Take 1 tablet (4 mg total) by mouth every 6 (six) hours as needed for nausea. 09/17/21   Raiford Noble Latif, DO  polyethylene glycol powder (GLYCOLAX/MIRALAX) 17 GM/SCOOP powder Take 17 g by mouth at bedtime.    [provider]  pravastatin (PRAVACHOL) 20 MG tablet TAKE 1 TABLET(20 MG) BY MOUTH AT BEDTIME Patient taking differently: Take 20 mg by mouth daily. 01/18/21   [name], MD  predniSONE (DELTASONE) 10 MG tablet Take 1 tablet (10 mg total) by mouth daily. 40 mg daily for 1 day, 30 mg daily for 1 day, 20 mg p.o. daily for 1 day, 10 mg daily for 1 day. 10/07/21   Val Riles, MD  Probiotic Product (ALIGN) 4 MG CAPS Take 1 capsule (4 mg total) by mouth daily. Patient not taking: No sig reported 12/15/16   Nicholes Mango, MD  Selenium 200 MCG CAPS Take 200 mcg by mouth daily.     [provider]  traZODone (DESYREL) 50 MG tablet Take 50 mg by mouth at bedtime. 08/12/21   [provider]   triamcinolone ointment (KENALOG) 0.1 % Apply 1 application topically as needed. 10/06/20   [provider]  Turmeric 500 MG CAPS Take 500 mg by mouth daily.     [provider]  vitamin B-12 (CYANOCOBALAMIN) 1000 MCG tablet Take 1,000 mcg by mouth daily.    [provider]  Zinc 50 MG TABS Take by mouth.    [provider]    Allergies    Chlorhexidine, Ciprofloxacin, Citalopram hydrobromide, Clindamycin, Lorazepam, Paroxetine, Ramipril, Simvastatin, and Sulfa antibiotics  Review of Systems   Review of Systems  Constitutional:  Negative for fever.  HENT:  Negative for sore throat.   Eyes:  Negative for visual disturbance.  Respiratory:  Negative for shortness of breath. Cough: significantly improved--resolved per pt.  Cardiovascular:  Negative for chest pain.  Gastrointestinal:  Negative for abdominal pain, nausea and vomiting.  Genitourinary:  Negative for difficulty urinating.  Musculoskeletal:  Negative for back pain and neck stiffness.  Skin:  Negative for rash.  Neurological:  Positive for speech difficulty. Negative for syncope, facial asymmetry, weakness, numbness and headaches.   Physical Exam Updated Vital Signs BP (!) 115/58 (BP Location: Left Arm)    Pulse 67    Temp 97.6 F (36.4 C) (Oral)    Resp 16    SpO2 97%   Physical Exam Vitals and nursing note reviewed.  Constitutional:      General: He is not in acute distress.    Appearance: He is well-developed. He is not diaphoretic.  HENT:     Head: Normocephalic  and atraumatic.  Eyes:     Conjunctiva/sclera: Conjunctivae normal.  Cardiovascular:     Rate and Rhythm: Normal rate and regular rhythm.     Heart sounds: Normal heart sounds. No murmur heard.   No friction rub. No gallop.  Pulmonary:     Effort: Pulmonary effort is normal. No respiratory distress.     Breath sounds: Normal breath sounds. No wheezing or rales.  Abdominal:     General: There is no distension.      Palpations: Abdomen is soft.     Tenderness: There is no abdominal tenderness. There is no guarding.  Musculoskeletal:     Cervical back: Normal range of motion.  Skin:    General: Skin is warm and dry.  Neurological:     General: No focal deficit present.     Mental Status: He is alert and oriented to person, place, and time.     Comments: Reports chronic LLE weakness which he attributes to arthritis, pain moving bilateral shoulders limits exam, however no new focal weakness, reports normal sensation, inconsistent with finger to nose, at times past pointing on the right but when repeated normal , normal EOM, tongue and palate midline, no facial droop    ED Results / Procedures / Treatments   Labs (all labs ordered are listed, but only abnormal results are displayed) Labs Reviewed  PROTIME-INR - Abnormal; Notable for the following components:      Result Value   Prothrombin Time 18.2 (*)    INR 1.5 (*)    All other components within normal limits  APTT - Abnormal; Notable for the following components:   aPTT 38 (*)    All other components within normal limits  CBC - Abnormal; Notable for the following components:   RBC 3.09 (*)    Hemoglobin 10.1 (*)    HCT 31.8 (*)    MCV 102.9 (*)    Platelets 117 (*)    All other components within normal limits  DIFFERENTIAL - Abnormal; Notable for the following components:   Abs Immature Granulocytes 0.11 (*)    All other components within normal limits  COMPREHENSIVE METABOLIC PANEL - Abnormal; Notable for the following components:   Glucose, Bld 114 (*)    Creatinine, Ser 1.25 (*)    Calcium 8.1 (*)    Total Protein 5.3 (*)    Albumin 2.7 (*)    GFR, Estimated 56 (*)    All other components within normal limits  ETHANOL    EKG EKG Interpretation  Date/Time:  Friday November 12 2021 04:46:22 EST Ventricular Rate:  64 PR Interval:  188 QRS Duration: 156 QT Interval:  486 QTC Calculation: 501 R Axis:   -63 Text  Interpretation: Normal sinus rhythm Left axis deviation Right bundle branch block Minimal voltage criteria for LVH, may be normal variant ( R in aVL ) Abnormal ECG No significant change since last tracing Confirmed by Gareth Morgan [phone]) on 11/12/2021 5:27:57 AM  Radiology CT HEAD WO CONTRAST  Result Date: 11/12/2021 CLINICAL DATA:  87 year old male with slurred speech. Possible transient ischemic attack. EXAM: CT HEAD WITHOUT CONTRAST TECHNIQUE: Contiguous axial images were obtained from the base of the skull through the vertex without intravenous contrast. COMPARISON:  Head CT 04/02/2021. FINDINGS: Brain: Moderate cerebral atrophy. Patchy and confluent areas of decreased attenuation are noted throughout the deep and periventricular white matter of the cerebral hemispheres bilaterally, compatible with chronic microvascular ischemic disease. No evidence of acute infarction, hemorrhage, hydrocephalus, extra-axial collection  or mass lesion/mass effect. Vascular: No hyperdense vessel or unexpected calcification. Skull: Normal. Negative for fracture or focal lesion. Sinuses/Orbits: Extensive mucoperiosteal thickening throughout the paranasal sinuses, most severe in the ethmoid sinuses bilaterally, sphenoid sinuses bilaterally, and the right maxillary sinus. Air-fluid levels are present in the right maxillary and right sphenoid sinus. Other: None. IMPRESSION: 1. No acute intracranial abnormalities. 2. Changes of chronic sinusitis, with additional air-fluid levels in the right maxillary and right sphenoid sinus which may suggest an acute sinusitis. Clinical correlation is recommended. 3. Moderate cerebral atrophy with extensive chronic microvascular ischemic changes in the cerebral white matter, as above. Electronically Signed   By: Vinnie Langton M.D.   On: 11/12/2021 04:58   MR BRAIN WO CONTRAST  Result Date: 11/12/2021 CLINICAL DATA:  Neuro deficit with acute stroke suspected. EXAM: MRI HEAD WITHOUT  CONTRAST TECHNIQUE: Multiplanar, multiecho pulse sequences of the brain and surrounding structures were obtained without intravenous contrast. COMPARISON:  Head CT from earlier today FINDINGS: Brain: No acute infarction, hemorrhage, hydrocephalus, extra-axial collection or mass lesion. Small remote cortically based infarction at the high right vertex. Vascular: Normal flow voids Skull and upper cervical spine: Normal marrow signal Sinuses/Orbits: Generalized mucosal thickening in the paranasal sinuses with right maxillary fluid level. Negative orbits. IMPRESSION: 1. No acute finding such as infarct. 2. Small remote right frontal cortex infarct. 3. Active/acute sinusitis. Electronically Signed   By: Jorje Guild M.D.   On: 11/12/2021 07:26    Procedures Procedures   Medications Ordered in ED Medications - No data to display  ED Course  I have reviewed the triage vital signs and the nursing notes.  Pertinent labs & imaging results that were available during my care of the patient were reviewed by me and considered in my medical decision making (see chart for details).    MDM Rules/Calculators/A&P                           87yo male with history of CHF, basal cell carcinoma, RA, CKD, CAD, atrial fibrillation on eliquis, hypertension, TIA, who presents from [facility] with concern for episode of dysarthria.  Describes difficulty speaking independent from other factors (like dry mouth) as an episode that lasted approximately 7 minutes then resolved. History concerning for TIA with ddx also including CVA, ICH, metabolic abnormalities.  Labs without acute findings. Vital signs normal. CT head without acute findings with exception of sinusitis and he is receiving rocephin now at the facility.  MRI brain pending at time of transfer of care. Discussed concern for TIA with Dr. Rory Percy Neurology and given atrial fibrillation history on anticoagulation, feels if MRI is normal with no new stroke  he can be managed as outpatient. Signed out to Dr. Francia Greaves with MRI pending.        Final Clinical Impression(s) / ED Diagnoses Final diagnoses:  Dysarthria    Rx / DC Orders ED Discharge Orders     None          Gareth Morgan, MD 11/12/21 [phone]

## 2021-11-12 NOTE — ED Notes (Signed)
[facility] called to transport patient  

## 2021-11-15 ENCOUNTER — Ambulatory Visit: Payer: Medicare PPO

## 2021-11-16 ENCOUNTER — Ambulatory Visit (INDEPENDENT_AMBULATORY_CARE_PROVIDER_SITE_OTHER): Payer: Medicare PPO | Admitting: *Deleted

## 2021-11-16 ENCOUNTER — Ambulatory Visit (INDEPENDENT_AMBULATORY_CARE_PROVIDER_SITE_OTHER): Payer: Medicare PPO

## 2021-11-16 ENCOUNTER — Other Ambulatory Visit: Payer: Self-pay

## 2021-11-16 ENCOUNTER — Ambulatory Visit (INDEPENDENT_AMBULATORY_CARE_PROVIDER_SITE_OTHER): Payer: Medicare PPO | Admitting: Adult Health

## 2021-11-16 ENCOUNTER — Encounter: Payer: Self-pay | Admitting: Adult Health

## 2021-11-16 VITALS — BP 137/70 | HR 67 | Temp 97.5°F | Ht 69.5 in | Wt 200.0 lb

## 2021-11-16 VITALS — BP 137/70 | HR 67 | Temp 97.5°F | Resp 16 | Ht 69.0 in | Wt 200.0 lb

## 2021-11-16 DIAGNOSIS — J4550 Severe persistent asthma, uncomplicated: Secondary | ICD-10-CM

## 2021-11-16 DIAGNOSIS — J4551 Severe persistent asthma with (acute) exacerbation: Secondary | ICD-10-CM | POA: Diagnosis not present

## 2021-11-16 DIAGNOSIS — M6281 Muscle weakness (generalized): Secondary | ICD-10-CM | POA: Diagnosis not present

## 2021-11-16 DIAGNOSIS — J4540 Moderate persistent asthma, uncomplicated: Secondary | ICD-10-CM | POA: Diagnosis not present

## 2021-11-16 DIAGNOSIS — J189 Pneumonia, unspecified organism: Secondary | ICD-10-CM | POA: Diagnosis not present

## 2021-11-16 DIAGNOSIS — I5032 Chronic diastolic (congestive) heart failure: Secondary | ICD-10-CM | POA: Diagnosis not present

## 2021-11-16 MED ORDER — OMALIZUMAB 75 MG/0.5ML ~~LOC~~ SOSY
375.0000 mg | PREFILLED_SYRINGE | Freq: Once | SUBCUTANEOUS | Status: AC
  Administered 2021-11-16: 16:00:00 375 mg via SUBCUTANEOUS
  Filled 2021-11-16: qty 0.5

## 2021-11-16 MED ORDER — FAMOTIDINE IN NACL 20-0.9 MG/50ML-% IV SOLN: 20.0000 mg | Freq: Once | INTRAVENOUS | Status: DC | PRN

## 2021-11-16 MED ORDER — SODIUM CHLORIDE 0.9 % IV SOLN: Freq: Once | INTRAVENOUS | Status: DC | PRN

## 2021-11-16 MED ORDER — EPINEPHRINE 0.3 MG/0.3ML IJ SOAJ: 0.3000 mg | Freq: Once | INTRAMUSCULAR | Status: DC | PRN

## 2021-11-16 MED ORDER — BUDESONIDE-FORMOTEROL FUMARATE 160-4.5 MCG/ACT IN AERO: 2.0000 | INHALATION_SPRAY | Freq: Two times a day (BID) | RESPIRATORY_TRACT | 6 refills | Status: DC

## 2021-11-16 MED ORDER — ALBUTEROL SULFATE HFA 108 (90 BASE) MCG/ACT IN AERS: 2.0000 | INHALATION_SPRAY | Freq: Once | RESPIRATORY_TRACT | Status: DC | PRN

## 2021-11-16 MED ORDER — METHYLPREDNISOLONE SODIUM SUCC 125 MG IJ SOLR: 125.0000 mg | Freq: Once | INTRAMUSCULAR | Status: DC | PRN

## 2021-11-16 MED ORDER — DIPHENHYDRAMINE HCL 50 MG/ML IJ SOLN: 50.0000 mg | Freq: Once | INTRAMUSCULAR | Status: DC | PRN

## 2021-11-16 NOTE — Assessment & Plan Note (Signed)
Appears compensated without evidence of volume overload on exam.  Continue on current regimen 

## 2021-11-16 NOTE — Progress Notes (Signed)
Diagnosis: Asthma  Provider:  Marshell Garfinkel, MD  Procedure: Injection  Xolair (Omalizumab), Dose: 375 mg, Site: subcutaneous, Number of injections: 3  Discharge: Condition: Good, Destination: Home . AVS provided to patient.   Performed by:  Oren Beckmann, RN

## 2021-11-16 NOTE — Progress Notes (Signed)
Patient seen in the office today and instructed on use of flutter valve.  Patient expressed understanding and demonstrated technique.  

## 2021-11-16 NOTE — Progress Notes (Signed)
@Patient  ID: [name], male    DOB: August 11, 1934, 87 y.o.   MRN: [id number]  Chief Complaint  Patient presents with   Follow-up    Referring provider: [name], MD  HPI: 87 yo male never smoker followed for moderate persistent allergic asthma on Xolair .  Hx of RA-Methotrexate and Plaquenil ,  Neurogenic bladder-chronic foley  , PAF  on Elqiuis   TEST/EVENTS :  PFT 04/07/10 >> FEV1 2.61 (86%), FEV1% 73, TLC 7.26 (85%), TLC 112% CT chest 03/15/17 > atherosclerosis, 3.4 cm Lt lung base density rounded ATX, granuloma in lingula, 3 mm RLL nodule, 4 mm RLL nodule CT chest 09/18/17 >> atherosclerosis, rounded ATX LLL, stable 4 mm RLL nodule, calcified granuloma in lingula, stable  4 mm RLL nodule   Cardiac Tests:  Echo 04/28/20 >> EF 60 to 65%, mod LVH, grade 2 DD, mild AS, aortic root 42 mm  11/16/2021 Follow up  : Asthma , Pneumonia , Post hospital follow up  Returns for follow up. Doing very until [facility] October when he was admitted with UTI, found also to have Covid 19 . He was discharged to rehab . Continued to have ongoing symptoms was readmitted last month with asthma flare and pneumonia . Treated with antibiotics -Rocephin and steroids . Seen by PCP recently last week for ongoing cough, started Doxycycline and Prednisone . Has few days left.  Patient complains that he continues to have ongoing symptoms. chest xray 10/05/21 clear lungs.  RA on methotrexate and plaquenil .  Wheelchair bound mostly , weak, has balance issues. Home PT starts tomorrow.  Remains on Xolair . Take Albuterol as needed.  Patient denies any hemoptysis chest pain orthopnea PND or increased leg swelling   Allergies  Allergen Reactions   Chlorhexidine    Ciprofloxacin Nausea And Vomiting    Headache   Citalopram Hydrobromide Other (See Comments)    "unknown"   Clindamycin Nausea And Vomiting   Lorazepam Other (See Comments)    Adverse reaction   Paroxetine Nausea Only   Ramipril Other (See  Comments)    "unknown"   Simvastatin    Sulfa Antibiotics Other (See Comments)    Immunization History  Administered Date(s) Administered   Fluad Quad(high Dose 65+) 09/05/2019, 09/27/2021   Influenza Split 09/26/2011   Influenza Whole 09/07/2007, 08/21/2008, 09/01/2009, 08/23/2010   Influenza, High Dose Seasonal PF 08/24/2020   Influenza,inj,Quad PF,6+ Mos 09/04/2013, 07/28/2014, 08/20/2015, 08/08/2016, 09/05/2017, 09/03/2018   Influenza-Unspecified 09/06/2012   PFIZER(Purple Top)SARS-COV-2 Vaccination 12/17/2019, 01/11/2020, 10/17/2020   Pneumococcal Conjugate-13 07/28/2014   Pneumococcal Polysaccharide-23 04/09/2012   Td 07/13/2009    Past Medical History:  Diagnosis Date   (HFpEF) heart failure with preserved ejection fraction ([place])    a. 02/2019 Echo: Ef 60-65%, mildly reduced RV fxn. RVSP 47mmHg. Mildly dil LA. Mod dil RA. Mild to mod TR. Mild to mod AS. Triv AI.   Allergy    Anemia    Anxiety    Arthritis    rheumatoid   Asthma    Basal cell carcinoma    back   CAD (coronary artery disease)    CHF (congestive heart failure) (HCC)    Chronic kidney disease    Mass right kidney   Chronic sinusitis    Collagen vascular disease (HCC)    Rheumatoid Arthritis   Depression    Diverticulitis    Dysrhythmia    ED (erectile dysfunction)    GERD (gastroesophageal reflux disease)    History of  SIADH    Hx of adenomatous colonic polyps    Hypertension    Hyponatremia    IBS (irritable bowel syndrome)    Neurodermatitis    Neurogenic bladder    OSA (obstructive sleep apnea)    no CPAP since weight loss   Persistent atrial fibrillation ([place])    a. Dx 02/2019; b. CHA2DS2VASc = 6-->eliquis initiated.   Prostate cancer [facility])    Renal mass    10/2018 4.2cm R renal cortical mass. Most compatible w/ clear cell renal cell carcinoma.    Tobacco History: Social History   Tobacco Use  Smoking Status Never  Smokeless Tobacco Never   Counseling given: Not  Answered   Outpatient Medications Prior to Visit  Medication Sig Dispense Refill   acetaminophen (TYLENOL) 500 MG tablet Take 500 mg by mouth 2 (two) times daily. May take an additional 500mg  as needed for arthritis     albuterol (VENTOLIN HFA) 108 (90 Base) MCG/ACT inhaler Inhale 2 puffs into the lungs every 6 (six) hours as needed for wheezing or shortness of breath. 8 g 2   ALOE PO Take 400 mg by mouth daily.      ALPRAZolam (XANAX) 0.5 MG tablet Take 1 tablet (0.5 mg total) by mouth at bedtime. 3 tablet 0   apixaban (ELIQUIS) 5 MG TABS tablet Take 1 tablet (5 mg total) by mouth 2 (two) times daily. 60 tablet    Ascorbic Acid (VITAMIN C PO) Take 1,000 mg by mouth daily.      cephALEXin (KEFLEX) 500 MG capsule Take 1 capsule (500 mg total) by mouth 4 (four) times daily. 28 capsule 0   cetirizine (ZYRTEC) 10 MG tablet Take 10 mg by mouth at bedtime.     chlorhexidine (PERIDEX) 0.12 % solution 1 mL by Mouth Rinse route as needed.     Cholecalciferol (VITAMIN D3) 5000 UNITS CAPS Take 2,000 Units by mouth daily.      Coenzyme Q10 (CO Q 10) 100 MG CAPS Take 100 mg by mouth daily.      CRANBERRY SOFT PO Take 250 mg by mouth daily.     dextromethorphan-guaiFENesin (MUCINEX DM) 30-600 MG 12hr tablet Take 1 tablet by mouth 2 (two) times daily as needed for cough.     doxycycline (VIBRAMYCIN) 100 MG capsule Take 100 mg by mouth 2 (two) times daily.     EPINEPHrine 0.3 mg/0.3 mL IJ SOAJ injection Inject 0.3 mg into the muscle as needed for anaphylaxis.     fluticasone (FLONASE) 50 MCG/ACT nasal spray Place 2 sprays into both nostrils 2 (two) times daily. 48 g 3   folic acid (FOLVITE) 474 MCG tablet Take 1,247 mcg by mouth daily.     gabapentin (NEURONTIN) 600 MG tablet Take 600 mg by mouth 2 (two) times daily at 8 am and 10 pm.     hydroxychloroquine (PLAQUENIL) 200 MG tablet Take 200 mg by mouth 2 (two) times daily.      iron polysaccharides (NIFEREX) 150 MG capsule Take 1 capsule (150 mg total) by  mouth daily. 30 capsule 2   ketoconazole (NIZORAL) 2 % cream Apply 1 application topically 2 (two) times daily. 30 g 0   ketoconazole (NIZORAL) 2 % shampoo Apply 1 application topically 2 (two) times a week.     methotrexate (RHEUMATREX) 2.5 MG tablet Take 10 tablets by mouth once a week. Taken on Wednesdays     metoprolol succinate (TOPROL-XL) 25 MG 24 hr tablet Take 0.5 tablets (12.5 mg total)  by mouth 2 (two) times daily. 30 tablet 0   milk thistle 175 MG tablet Take 175 mg by mouth daily.     MYRBETRIQ 50 MG TB24 tablet TAKE 1 TABLET(50 MG) BY MOUTH DAILY (Patient taking differently: Take 50 mg by mouth.) 30 tablet 3   Omega-3 Fatty Acids (FISH OIL) 1200 MG CAPS Take 1,200 mg by mouth daily.     omeprazole (PRILOSEC) 20 MG capsule Take 1 capsule (20 mg total) by mouth daily as needed. 90 capsule 1   ondansetron (ZOFRAN) 4 MG tablet Take 1 tablet (4 mg total) by mouth every 6 (six) hours as needed for nausea. 20 tablet 0   polyethylene glycol powder (GLYCOLAX/MIRALAX) 17 GM/SCOOP powder Take 17 g by mouth at bedtime.     pravastatin (PRAVACHOL) 20 MG tablet TAKE 1 TABLET(20 MG) BY MOUTH AT BEDTIME (Patient taking differently: Take 20 mg by mouth daily.) 90 tablet 3   predniSONE (DELTASONE) 10 MG tablet Take 1 tablet (10 mg total) by mouth daily. 40 mg daily for 1 day, 30 mg daily for 1 day, 20 mg p.o. daily for 1 day, 10 mg daily for 1 day.  0   Probiotic Product (ALIGN) 4 MG CAPS Take 1 capsule (4 mg total) by mouth daily. 15 capsule 0   Selenium 200 MCG CAPS Take 200 mcg by mouth daily.      traZODone (DESYREL) 50 MG tablet Take 50 mg by mouth at bedtime.     triamcinolone ointment (KENALOG) 0.1 % Apply 1 application topically as needed.     Turmeric 500 MG CAPS Take 500 mg by mouth daily.      vitamin B-12 (CYANOCOBALAMIN) 1000 MCG tablet Take 1,000 mcg by mouth daily.     Zinc 50 MG TABS Take by mouth.     0.9 %  sodium chloride infusion      albuterol (VENTOLIN HFA) 108 (90 Base) MCG/ACT  inhaler 2 puff      diphenhydrAMINE (BENADRYL) injection 50 mg      EPINEPHrine (EPI-PEN) injection 0.3 mg      famotidine (PEPCID) IVPB 20 mg premix      methylPREDNISolone sodium succinate (SOLU-MEDROL) 125 mg/2 mL injection 125 mg      No facility-administered medications prior to visit.     Review of Systems:   Constitutional:   No  weight loss, night sweats,  Fevers, chills, + fatigue, or  lassitude.  HEENT:   No headaches,  Difficulty swallowing,  Tooth/dental problems, or  Sore throat,                No sneezing, itching, ear ache, nasal congestion, post nasal drip,   CV:  No chest pain,  Orthopnea, PND, swelling in lower extremities, anasarca, dizziness, palpitations, syncope.   GI  No heartburn, indigestion, abdominal pain, nausea, vomiting, diarrhea, change in bowel habits, loss of appetite, bloody stools.   Resp:   No chest wall deformity  Skin: no rash or lesions.  GU: no dysuria, change in color of urine, no urgency or frequency.  No flank pain, no hematuria   MS:  No joint pain or swelling.  No decreased range of motion.  No back pain.    Physical Exam  BP 137/70 (BP Location: Left Arm, Patient Position: Sitting, Cuff Size: Large)    Pulse 67    Temp (!) 97.5 F (36.4 C) (Oral)    Ht 5' 9.5" (1.765 m)    Wt 200 lb (90.7 kg)  SpO2 95%    BMI 29.11 kg/m   GEN: A/Ox3; pleasant , NAD, chronically ill-appearing in wheelchair    HEENT:  [place]/AT,   NOSE-clear, THROAT-clear, no lesions, no postnasal drip or exudate noted.   NECK:  Supple w/ fair ROM; no JVD; normal carotid impulses w/o bruits; no thyromegaly or nodules palpated; no lymphadenopathy.    RESP scattered rhonchi bilaterally  no accessory muscle use, no dullness to percussion  CARD:  RRR, no m/r/g, no peripheral edema, pulses intact, no cyanosis or clubbing.  GI:   Soft & nt; nml bowel sounds; no organomegaly or masses detected.  GU : chronic foley   Musco: Warm bil, no deformities or joint swelling  noted.   Neuro: alert, no focal deficits noted.    Skin: Warm, no lesions or rashes    Lab Results:  CBC    Component Value Date/Time   WBC 9.2 11/12/2021 0418   RBC 3.09 (L) 11/12/2021 0418   HGB 10.1 (L) 11/12/2021 0418   HGB 11.1 (L) 09/28/2014 1610   HCT 31.8 (L) 11/12/2021 0418   HCT 33.6 (L) 09/28/2014 1610   PLT 117 (L) 11/12/2021 0418   PLT 192 09/28/2014 1610   MCV 102.9 (H) 11/12/2021 0418   MCV 96 09/28/2014 1610   MCH 32.7 11/12/2021 0418   MCHC 31.8 11/12/2021 0418   RDW 15.2 11/12/2021 0418   RDW 16.9 (H) 09/28/2014 1610   LYMPHSABS 1.3 11/12/2021 0418   LYMPHSABS 1.3 09/09/2014 0652   MONOABS 0.5 11/12/2021 0418   MONOABS 0.4 09/09/2014 0652   EOSABS 0.0 11/12/2021 0418   EOSABS 0.5 09/09/2014 0652   BASOSABS 0.1 11/12/2021 0418   BASOSABS 0.1 09/09/2014 0652    BMET    Component Value Date/Time   NA 139 11/12/2021 0418   NA 135 05/26/2021 1416   NA 138 09/28/2014 1610   K 4.0 11/12/2021 0418   K 4.6 09/28/2014 1610   CL 101 11/12/2021 0418   CL 101 09/28/2014 1610   CO2 31 11/12/2021 0418   CO2 30 09/28/2014 1610   GLUCOSE 114 (H) 11/12/2021 0418   GLUCOSE 137 (H) 09/28/2014 1610   GLUCOSE 109 (H) 10/05/2006 1611   BUN 23 11/12/2021 0418   BUN 21 05/26/2021 1416   BUN 16 09/28/2014 1610   CREATININE 1.25 (H) 11/12/2021 0418   CREATININE 0.62 09/28/2014 1610   CALCIUM 8.1 (L) 11/12/2021 0418   CALCIUM 8.2 (L) 09/28/2014 1610   GFRNONAA 56 (L) 11/12/2021 0418   GFRNONAA >60 09/28/2014 1610   GFRNONAA >60 08/06/2014 1339   GFRAA >60 06/22/2019 0535   GFRAA >60 09/28/2014 1610   GFRAA >60 08/06/2014 1339    BNP    Component Value Date/Time   BNP 216.0 (H) 10/05/2021 1254    ProBNP No results found for: PROBNP  Imaging: CT HEAD WO CONTRAST  Result Date: 11/12/2021 CLINICAL DATA:  87 year old male with slurred speech. Possible transient ischemic attack. EXAM: CT HEAD WITHOUT CONTRAST TECHNIQUE: Contiguous axial images were  obtained from the base of the skull through the vertex without intravenous contrast. COMPARISON:  Head CT 04/02/2021. FINDINGS: Brain: Moderate cerebral atrophy. Patchy and confluent areas of decreased attenuation are noted throughout the deep and periventricular white matter of the cerebral hemispheres bilaterally, compatible with chronic microvascular ischemic disease. No evidence of acute infarction, hemorrhage, hydrocephalus, extra-axial collection or mass lesion/mass effect. Vascular: No hyperdense vessel or unexpected calcification. Skull: Normal. Negative for fracture or focal lesion. Sinuses/Orbits: Extensive mucoperiosteal thickening  throughout the paranasal sinuses, most severe in the ethmoid sinuses bilaterally, sphenoid sinuses bilaterally, and the right maxillary sinus. Air-fluid levels are present in the right maxillary and right sphenoid sinus. Other: None. IMPRESSION: 1. No acute intracranial abnormalities. 2. Changes of chronic sinusitis, with additional air-fluid levels in the right maxillary and right sphenoid sinus which may suggest an acute sinusitis. Clinical correlation is recommended. 3. Moderate cerebral atrophy with extensive chronic microvascular ischemic changes in the cerebral white matter, as above. Electronically Signed   By: Vinnie Langton M.D.   On: 11/12/2021 04:58   MR BRAIN WO CONTRAST  Result Date: 11/12/2021 CLINICAL DATA:  Neuro deficit with acute stroke suspected. EXAM: MRI HEAD WITHOUT CONTRAST TECHNIQUE: Multiplanar, multiecho pulse sequences of the brain and surrounding structures were obtained without intravenous contrast. COMPARISON:  Head CT from earlier today FINDINGS: Brain: No acute infarction, hemorrhage, hydrocephalus, extra-axial collection or mass lesion. Small remote cortically based infarction at the high right vertex. Vascular: Normal flow voids Skull and upper cervical spine: Normal marrow signal Sinuses/Orbits: Generalized mucosal thickening in the  paranasal sinuses with right maxillary fluid level. Negative orbits. IMPRESSION: 1. No acute finding such as infarct. 2. Small remote right frontal cortex infarct. 3. Active/acute sinusitis. Electronically Signed   By: Jorje Guild M.D.   On: 11/12/2021 07:26   CT Renal Stone Study  Result Date: 10/19/2021 CLINICAL DATA:  Hematuria, unknown cause. Bladder pain. Chronic Foley catheter. EXAM: CT ABDOMEN AND PELVIS WITHOUT CONTRAST TECHNIQUE: Multidetector CT imaging of the abdomen and pelvis was performed following the standard protocol without IV contrast. COMPARISON:  Abdominopelvic CT 01/18/2021 and 01/17/2020. FINDINGS: Lower chest: Interval increased streaky opacity in the right lower lobe. Chronic left pleural thickening and left lower lobe scarring have not significantly changed. No significant pleural or pericardial effusion. Atherosclerosis of the aorta and coronary arteries. Prominent calcifications of the aortic valve and mitral annulus are again noted. Hepatobiliary: The liver appears unremarkable as imaged in the noncontrast state. No evidence of gallstones, gallbladder wall thickening or biliary dilatation. Pancreas: Unremarkable. No pancreatic ductal dilatation or surrounding inflammatory changes. Spleen: Normal in size without focal abnormality on noncontrast imaging. Adrenals/Urinary Tract: Both adrenal glands appear normal. Status post right nephrectomy without mass in the nephrectomy bed. The left kidney appears unchanged. There is no evidence of urinary tract calculus, hydronephrosis or perinephric soft tissue stranding. There is minimal prominence of both distal ureters. Foley catheter is in place with a small amount of high density adjacent to the retention balloon, possibly reflecting blood clot. There is gas in the bladder attributed to the fully. No bladder wall thickening or surrounding inflammation identified. Stomach/Bowel: No enteric contrast administered. The stomach appears  unremarkable for its degree of distension. No evidence of bowel wall thickening, distention or surrounding inflammatory change. The appendix appears normal. Moderate stool throughout the colon. Mild sigmoid colon diverticular changes. Vascular/Lymphatic: There are no enlarged abdominal or pelvic lymph nodes. Diffuse aortic and branch vessel atherosclerosis without aneurysm or acute vascular findings on noncontrast imaging. Reproductive: Status post prostatectomy and pelvic lymphadenectomy. Other: No evidence of abdominal wall mass or hernia. Probable right inguinal herniorrhaphy changes. No ascites. Musculoskeletal: No acute or significant osseous findings. Multilevel thoracolumbar spondylosis associated with a mild scoliosis. IMPRESSION: 1. No explanation for hematuria identified. There is a small amount of high density material within the bladder lumen adjacent to the Foley catheter which may reflect blood clot. Correlate with urine analysis. For persistent unexplained hematuria, further evaluation with cystoscopy may be  warranted. 2. Mild dilatation of both distal ureters without urinary tract calculus or surrounding soft tissue stranding. Previous right nephrectomy. The left kidney appears unremarkable. 3. Interval increased density dependently at the right lung base, likely scarring or atelectasis. Left basilar scarring appears unchanged. Suggest radiographic follow-up. 4.  Aortic Atherosclerosis (ICD10-I70.0). Electronically Signed   By: Richardean Sale M.D.   On: 10/19/2021 12:58    omalizumab Arvid Right) prefilled syringe 375 mg     Date Action Dose Route User   Discharged on 11/12/2021   Admitted on 11/12/2021   Discharged on 10/19/2021   Admitted on 10/19/2021   10/13/2021 1443 Given 375 mg Subcutaneous (Abdominal Tissue) Oren Beckmann, RN      omalizumab Arvid Right) prefilled syringe 375 mg     Date Action Dose Route User   Discharged on 11/12/2021   Admitted on 11/12/2021   10/27/2021  1447 Given 375 mg Subcutaneous (Abdominal Tissue) Ranabhat, Sabitri, RN      omalizumab Arvid Right) prefilled syringe 375 mg     Date Action Dose Route User   11/16/2021 1541 Given 375 mg Subcutaneous (Abdominal Tissue) Oren Beckmann, RN       No flowsheet data found.  No results found for: NITRICOXIDE      Assessment & Plan:   Severe persistent asthma Slow to resolve exacerbation since having COVID-19 in October 2022.  Patient has been on courses of antibiotics and steroids. Patient is instructed to finish up doxycycline and prednisone.  Check chest x-ray today.  If continues to have ongoing symptoms would consider checking a sputum culture.  Patient is immunosuppressed. Would begin Symbicort for better asthma maintenance regimen.  Continue on Xolair.  Add flutter valve. May need to check CT chest and sed rate as patient is on underlying methotrexate.  Plan  Patient Instructions  Chest xray today  Finish Doxycycline and Prednisone .  Begin Symbicort 160 2 puffs Twice daily  , rinse after use.  Begin Flutter valve Twice daily  . Albuterol inhaler As needed   Mucinex DM Twice daily  As needed cough/congestion  Follow up with Dr. Halford Chessman or Maxum Cassarino NP  in 2 weeks and As needed   Please contact office for sooner follow up if symptoms do not improve or worsen or seek emergency care        Chronic diastolic CHF (congestive heart failure) ([place]) Appears compensated without evidence of volume overload on exam.  Continue on current regimen   I spent   40 minutes dedicated to the care of this patient on the date of this encounter to include pre-visit review of records, face-to-face time with the patient discussing conditions above, post visit ordering of testing, clinical documentation with the electronic health record, making appropriate referrals as documented, and communicating necessary findings to members of the patients care team.    Rexene Edison,  NP 11/16/2021

## 2021-11-16 NOTE — Patient Instructions (Addendum)
Chest xray today  Finish Doxycycline and Prednisone .  Begin Symbicort 160 2 puffs Twice daily  , rinse after use.  Begin Flutter valve Twice daily  . Albuterol inhaler As needed   Mucinex DM Twice daily  As needed cough/congestion  Follow up with Dr. Halford Chessman or Sevyn Markham NP  in 2 weeks and As needed   Please contact office for sooner follow up if symptoms do not improve or worsen or seek emergency care

## 2021-11-16 NOTE — Assessment & Plan Note (Signed)
Slow to resolve exacerbation since having COVID-19 in October 2022.  Patient has been on courses of antibiotics and steroids. Patient is instructed to finish up doxycycline and prednisone.  Check chest x-ray today.  If continues to have ongoing symptoms would consider checking a sputum culture.  Patient is immunosuppressed. Would begin Symbicort for better asthma maintenance regimen.  Continue on Xolair.  Add flutter valve. May need to check CT chest and sed rate as patient is on underlying methotrexate.  Plan  Patient Instructions  Chest xray today  Finish Doxycycline and Prednisone .  Begin Symbicort 160 2 puffs Twice daily  , rinse after use.  Begin Flutter valve Twice daily  . Albuterol inhaler As needed   Mucinex DM Twice daily  As needed cough/congestion  Follow up with Dr. Halford Chessman or Marina Desire NP  in 2 weeks and As needed   Please contact office for sooner follow up if symptoms do not improve or worsen or seek emergency care

## 2021-11-17 NOTE — Progress Notes (Signed)
Reviewed and agree with assessment/plan.   Chesley Mires, MD [facility] Pulmonary/Critical Care 11/17/2021, 7:45 AM Pager:  [phone]

## 2021-11-23 ENCOUNTER — Ambulatory Visit: Payer: Self-pay

## 2021-11-24 ENCOUNTER — Other Ambulatory Visit: Payer: Self-pay | Admitting: Pharmacy Technician

## 2021-11-24 ENCOUNTER — Ambulatory Visit: Payer: Medicare PPO

## 2021-11-30 ENCOUNTER — Telehealth: Payer: Self-pay | Admitting: Internal Medicine

## 2021-11-30 ENCOUNTER — Other Ambulatory Visit: Payer: Self-pay

## 2021-11-30 ENCOUNTER — Ambulatory Visit (INDEPENDENT_AMBULATORY_CARE_PROVIDER_SITE_OTHER): Payer: Medicare PPO | Admitting: *Deleted

## 2021-11-30 VITALS — BP 106/54 | HR 62 | Temp 97.7°F | Resp 16

## 2021-11-30 DIAGNOSIS — J4540 Moderate persistent asthma, uncomplicated: Secondary | ICD-10-CM

## 2021-11-30 DIAGNOSIS — J4550 Severe persistent asthma, uncomplicated: Secondary | ICD-10-CM

## 2021-11-30 MED ORDER — FAMOTIDINE IN NACL 20-0.9 MG/50ML-% IV SOLN: 20.0000 mg | Freq: Once | INTRAVENOUS | Status: DC | PRN

## 2021-11-30 MED ORDER — EPINEPHRINE 0.3 MG/0.3ML IJ SOAJ: 0.3000 mg | Freq: Once | INTRAMUSCULAR | Status: DC | PRN

## 2021-11-30 MED ORDER — DIPHENHYDRAMINE HCL 50 MG/ML IJ SOLN: 50.0000 mg | Freq: Once | INTRAMUSCULAR | Status: DC | PRN

## 2021-11-30 MED ORDER — METHYLPREDNISOLONE SODIUM SUCC 125 MG IJ SOLR: 125.0000 mg | Freq: Once | INTRAMUSCULAR | Status: DC | PRN

## 2021-11-30 MED ORDER — ALBUTEROL SULFATE HFA 108 (90 BASE) MCG/ACT IN AERS: 2.0000 | INHALATION_SPRAY | Freq: Once | RESPIRATORY_TRACT | Status: DC | PRN

## 2021-11-30 MED ORDER — SODIUM CHLORIDE 0.9 % IV SOLN: Freq: Once | INTRAVENOUS | Status: DC | PRN

## 2021-11-30 MED ORDER — OMALIZUMAB 75 MG/0.5ML ~~LOC~~ SOSY
375.0000 mg | PREFILLED_SYRINGE | Freq: Once | SUBCUTANEOUS | Status: AC
  Administered 2021-11-30: 375 mg via SUBCUTANEOUS

## 2021-11-30 NOTE — Telephone Encounter (Signed)
Patient's daughter has called stating that her father is starting to experience bedsores on his bottom area. The pt is very immobile so she is wanting advise on what she an do besides moving him or if there is some kind of lotion for the patient.  She tried to find some OTC medication but could not.  Please adviseRip Harbour [phone]

## 2021-11-30 NOTE — Progress Notes (Signed)
Diagnosis: Asthma  Provider:  Marshell Garfinkel, MD  Procedure: Injection  Xolair (Omalizumab), Dose: 375 mg, Site: subcutaneous, Number of injections: 3  Discharge: Condition: Good, Destination: Home . AVS provided to patient.   Performed by:  Oren Beckmann, RN

## 2021-12-01 ENCOUNTER — Telehealth: Payer: Self-pay | Admitting: Internal Medicine

## 2021-12-01 NOTE — Telephone Encounter (Signed)
Home Health verbal orders Caller Name: [place] Name: Moreen Fowler number: [phone]  Requesting OT/PT/Skilled nursing/Social Work/Speech: nursing  Reason: pressure sores  Frequency: not sure due to one of the sore is oozing and bleeding  Please forward to [facility] pool or providers CMA

## 2021-12-02 ENCOUNTER — Other Ambulatory Visit: Payer: Self-pay

## 2021-12-02 ENCOUNTER — Ambulatory Visit (INDEPENDENT_AMBULATORY_CARE_PROVIDER_SITE_OTHER): Payer: Medicare PPO | Admitting: Urology

## 2021-12-02 DIAGNOSIS — N319 Neuromuscular dysfunction of bladder, unspecified: Secondary | ICD-10-CM

## 2021-12-02 MED ORDER — NYSTATIN 100000 UNIT/GM EX CREA: 1.0000 "application " | TOPICAL_CREAM | Freq: Two times a day (BID) | CUTANEOUS | 0 refills | Status: DC

## 2021-12-02 NOTE — Progress Notes (Signed)
Patient presented today for Foley catheter exchange.  Please see catheter exchange note for that information.  I was asked to come in as patient had red sores on his penis and also a bedsore that was bleeding.  On exam it was noted that he had a mild paraphimosis and he had balanitis.  He was also noted to have tissue excoriation in the early stages of a coccygeal decubitus ulcer forming.  He states he does have a virtual visit tomorrow with his primary care provider and I have advised him to ask for a referral to the wound clinic to address this further.  In regards to the balanitis, I have sent in a prescription for nystatin cream.  He has mild paraphimosis was easily reduced here in the office.  I spent 10 minutes on the day of the encounter to include pre-visit record review, face-to-face time with the patient, and post-visit ordering of tests.

## 2021-12-02 NOTE — Telephone Encounter (Signed)
Left message on VM for Maggie. Asked if pictures could me sent to Dr Silvio Pate.

## 2021-12-02 NOTE — Progress Notes (Signed)
Cath Change/ Replacement  Patient is present today for a catheter change due to urinary retention.  9 ml of water was removed from the balloon, a 16FR foley cath was removed with out difficulty.  Patient was cleaned and prepped in a sterile fashion with betadine. A 16 FR foley cath was replaced into the bladder no complications were noted Urine return was noted 10 ml and urine was yellow in color. The balloon was filled with 51ml of sterile water. A leg bag was attached for drainage.  A night bag was also given to the patient and patient was given instruction on how to change from one bag to another. Patient was given proper instruction on catheter care.    Performed by: Verlene Mayer, CMA  Follow up: 4 weeks

## 2021-12-02 NOTE — Telephone Encounter (Signed)
Spoke to [place]. They have been using a cream with lidocaine to help with the irritation. She will go get a diaper rash /zinc cream and call back if not better. He is going to urology this morning and will have them evaluate it.

## 2021-12-03 ENCOUNTER — Telehealth: Payer: Medicare PPO | Admitting: Family Medicine

## 2021-12-03 ENCOUNTER — Encounter: Payer: Self-pay | Admitting: Internal Medicine

## 2021-12-03 ENCOUNTER — Telehealth (INDEPENDENT_AMBULATORY_CARE_PROVIDER_SITE_OTHER): Payer: Medicare PPO | Admitting: Internal Medicine

## 2021-12-03 DIAGNOSIS — L89322 Pressure ulcer of left buttock, stage 2: Secondary | ICD-10-CM | POA: Diagnosis not present

## 2021-12-03 DIAGNOSIS — I480 Paroxysmal atrial fibrillation: Secondary | ICD-10-CM

## 2021-12-03 DIAGNOSIS — J4550 Severe persistent asthma, uncomplicated: Secondary | ICD-10-CM | POA: Diagnosis not present

## 2021-12-03 DIAGNOSIS — I5032 Chronic diastolic (congestive) heart failure: Secondary | ICD-10-CM

## 2021-12-03 MED ORDER — FLUCONAZOLE 100 MG PO TABS: 100.0000 mg | ORAL_TABLET | Freq: Every day | ORAL | 1 refills | Status: DC

## 2021-12-03 NOTE — Assessment & Plan Note (Signed)
Doing better now Did have exacerbation when he hadn't had his every other week xolair Uses albuterol prn

## 2021-12-03 NOTE — Assessment & Plan Note (Signed)
And areas on penis Could have some fungal component--especially the penis Discussed using zinc oxide--thick layer--for protection Will Rx fluconazole 100mg  daily for a week

## 2021-12-03 NOTE — Telephone Encounter (Signed)
Maggie called in and I related the message and she is going to have a nurse come in out on Sunday

## 2021-12-03 NOTE — Assessment & Plan Note (Signed)
Unclear if regular now (no exam) Continues on apixaban 5mg  bid

## 2021-12-03 NOTE — Progress Notes (Signed)
Subjective:    Patient ID: [name], male    DOB: [birth year], 88 y.o.   MRN: [id number]  HPI Video virtual visit for follow up after hospitalization and rehab stay Mostly to assess apparent bed sores Identification done Reviewed limitations and billing and he gave consent Participants---patient in his home with wife and daughter helping him I am in my office  [facility] in [facility]  Ongoing weakness COVID positive so extended stay without therapy Then went to rehab [facility]----but then had asthma attack due to missing xolair Hospitalized again--got Rx Then needed rehab again--then went to [place] was good--but not satisfied with the rest of care Got home about 2 weeks ago  Miralax and other meds in rehab--had fecal incontinence Now better since he is home. Gets up to toilet  Breathing is fine Back on the xolair every 2 weeks Was diagnosed with pneumonia at rehab about a week before going home Got rocephin, then oral meds Had pulmonary follow up after that--is better  Stands with assist---and then can transfer Moving around house in wheelchair---and can walk short distances with therapist Sits in shower chair--then daughter will wash him Assist with dressing Uses commode for toileting (mostly)  Getting PT now Awaiting OT evaluation ---hasn't happened yet Hasn't had RN visit  Small sore on buttock cheek---has rapidly increased in size Rash on penis--got cream from urologist for yeast but now it seems to have spread  Current Outpatient Medications on File Prior to Visit  Medication Sig Dispense Refill   acetaminophen (TYLENOL) 500 MG tablet Take 500 mg by mouth 2 (two) times daily. May take an additional 500mg  as needed for arthritis     albuterol (VENTOLIN HFA) 108 (90 Base) MCG/ACT inhaler Inhale 2 puffs into the lungs every 6 (six) hours as needed for wheezing or shortness of breath. 8 g 2   ALPRAZolam (XANAX) 0.5 MG tablet  Take 1 tablet (0.5 mg total) by mouth at bedtime. 3 tablet 0   apixaban (ELIQUIS) 5 MG TABS tablet Take 1 tablet (5 mg total) by mouth 2 (two) times daily. 60 tablet    Ascorbic Acid (VITAMIN C PO) Take 1,000 mg by mouth daily.      cetirizine (ZYRTEC) 10 MG tablet Take 10 mg by mouth at bedtime.     chlorhexidine (PERIDEX) 0.12 % solution 1 mL by Mouth Rinse route as needed.     Cholecalciferol (VITAMIN D3) 5000 UNITS CAPS Take 2,000 Units by mouth daily.      Coenzyme Q10 (CO Q 10) 100 MG CAPS Take 100 mg by mouth daily.      CRANBERRY SOFT PO Take 250 mg by mouth daily.     dextromethorphan-guaiFENesin (MUCINEX DM) 30-600 MG 12hr tablet Take 1 tablet by mouth 2 (two) times daily as needed for cough.     EPINEPHrine 0.3 mg/0.3 mL IJ SOAJ injection Inject 0.3 mg into the muscle as needed for anaphylaxis.     fluticasone (FLONASE) 50 MCG/ACT nasal spray Place 2 sprays into both nostrils 2 (two) times daily. 48 g 3   folic acid (FOLVITE) 621 MCG tablet Take 1,247 mcg by mouth daily.     gabapentin (NEURONTIN) 600 MG tablet Take 600 mg by mouth 2 (two) times daily at 8 am and 10 pm.     hydroxychloroquine (PLAQUENIL) 200 MG tablet Take 200 mg by mouth 2 (two) times daily.      ketoconazole (NIZORAL) 2 % cream Apply 1 application topically  2 (two) times daily. 30 g 0   ketoconazole (NIZORAL) 2 % shampoo Apply 1 application topically 2 (two) times a week.     methotrexate (RHEUMATREX) 2.5 MG tablet Take 10 tablets by mouth once a week. Taken on Wednesdays     metoprolol succinate (TOPROL-XL) 25 MG 24 hr tablet Take 0.5 tablets (12.5 mg total) by mouth 2 (two) times daily. 30 tablet 0   Multiple Vitamin (MULTIVITAMIN) tablet Take 1 tablet by mouth daily.     MYRBETRIQ 50 MG TB24 tablet TAKE 1 TABLET(50 MG) BY MOUTH DAILY (Patient taking differently: Take 50 mg by mouth.) 30 tablet 3   nystatin cream (MYCOSTATIN) Apply 1 application topically 2 (two) times daily. 30 g 0   Omega-3 Fatty Acids (FISH  OIL) 1200 MG CAPS Take 1,200 mg by mouth daily.     omeprazole (PRILOSEC) 20 MG capsule Take 1 capsule (20 mg total) by mouth daily as needed. 90 capsule 1   polyethylene glycol powder (GLYCOLAX/MIRALAX) 17 GM/SCOOP powder Take 17 g by mouth at bedtime.     pravastatin (PRAVACHOL) 20 MG tablet TAKE 1 TABLET(20 MG) BY MOUTH AT BEDTIME (Patient taking differently: Take 20 mg by mouth daily.) 90 tablet 3   Probiotic Product (ALIGN) 4 MG CAPS Take 1 capsule (4 mg total) by mouth daily. 15 capsule 0   traZODone (DESYREL) 50 MG tablet Take 50 mg by mouth at bedtime.     triamcinolone ointment (KENALOG) 0.1 % Apply 1 application topically as needed.     Turmeric 500 MG CAPS Take 500 mg by mouth daily.      Zinc 50 MG TABS Take by mouth.     [name] current facility-administered medications on file prior to visit.    Allergies  Allergen Reactions   Chlorhexidine    Ciprofloxacin Nausea And Vomiting    Headache   Citalopram Hydrobromide Other (See Comments)    "unknown"   Clindamycin Nausea And Vomiting   Lorazepam Other (See Comments)    Adverse reaction   Paroxetine Nausea Only   Ramipril Other (See Comments)    "unknown"   Simvastatin    Sulfa Antibiotics Other (See Comments)    Past Medical History:  Diagnosis Date   (HFpEF) heart failure with preserved ejection fraction ([place])    a. 02/2019 Echo: Ef 60-65%, mildly reduced RV fxn. RVSP 68mmHg. Mildly dil LA. Mod dil RA. Mild to mod TR. Mild to mod AS. Triv AI.   Allergy    Anemia    Anxiety    Arthritis    rheumatoid   Asthma    Basal cell carcinoma    back   CAD (coronary artery disease)    CHF (congestive heart failure) (HCC)    Chronic kidney disease    Mass right kidney   Chronic sinusitis    Collagen vascular disease (HCC)    Rheumatoid Arthritis   Depression    Diverticulitis    Dysrhythmia    ED (erectile dysfunction)    GERD (gastroesophageal reflux disease)    History of SIADH    Hx of adenomatous colonic polyps     Hypertension    Hyponatremia    IBS (irritable bowel syndrome)    Neurodermatitis    Neurogenic bladder    OSA (obstructive sleep apnea)    [name] CPAP since weight loss   Persistent atrial fibrillation ([place])    a. Dx 02/2019; b. CHA2DS2VASc = 6-->eliquis initiated.   Prostate cancer [facility])    Renal  mass    10/2018 4.2cm R renal cortical mass. Most compatible w/ clear cell renal cell carcinoma.    Past Surgical History:  Procedure Laterality Date   CARDIOVERSION N/A 04/23/2019   Procedure: CARDIOVERSION;  Surgeon: Nelva Bush, MD;  Location: [facility] ORS;  Service: Cardiovascular;  Laterality: N/A;   KNEE ARTHROPLASTY Right 09/02/2015   Procedure: COMPUTER ASSISTED TOTAL KNEE ARTHROPLASTY;  Surgeon: [name], MD;  Location: [facility] ORS;  Service: Orthopedics;  Laterality: Right;   LAPAROSCOPIC NEPHRECTOMY, HAND ASSISTED Right 06/17/2019   Procedure: HAND ASSISTED LAPAROSCOPIC NEPHRECTOMY;  Surgeon: Hollice Espy, MD;  Location: [facility] ORS;  Service: Urology;  Laterality: Right;   NASAL SINUS SURGERY  2009   DEVIATED SEPTUM AND POLYPS   permanent indwelling catheter     PROSTATE SURGERY     PROSTATECTOMY   TOTAL KNEE ARTHROPLASTY Left 9/15   Dr [name]   URETHRAL STRICTURE DILATATION  02-2010   Dr.Cope    Family History  Problem Relation Age of Onset   Heart disease Mother 42       heart failure   Heart failure Mother    Pneumonia Father 60       pnemonia   Skin cancer Father    Skin cancer Sister    Skin cancer Son    Colon cancer Neg Hx    Esophageal cancer Neg Hx    Stomach cancer Neg Hx    Pancreatic cancer Neg Hx    Liver disease Neg Hx     Social History   Socioeconomic History   Marital status: Married    Spouse name: Not on file   Number of children: 2   Years of education: Not on file   Highest education level: Not on file  Occupational History   Occupation: Radio Licensed conveyancer    Comment: retired  Tobacco Use   Smoking status: Never   Smokeless  tobacco: Never  Scientific laboratory technician Use: Never used  Substance and Sexual Activity   Alcohol use: Yes    Comment: 5 days out of 7 drinks scotch   Drug use: [name]   Sexual activity: Not Currently  Other Topics Concern   Not on file  Social History Narrative   Not sure about a living will or health care POA   Wife should make health care decisions for him--then daughter, then son   Would accept resuscitation attempts   Not sure about tube feeds   Social Determinants of Health   Financial Resource Strain: Not on file  Food Insecurity: Not on file  Transportation Needs: Not on file  Physical Activity: Not on file  Stress: Not on file  Social Connections: Not on file  Intimate Partner Violence: Not on file   Review of Systems Eating well Sleeping fine     Objective:   Physical Exam Constitutional:      Appearance: Normal appearance.  Pulmonary:     Effort: Pulmonary effort is normal. [name] respiratory distress.  Skin:    Comments: Superficial ulcerations on buttocks --larger on the right  Several other small areas Don't really look infected  Superficial ulcerations on penis as well Mildly inflamed  Neurological:     Mental Status: He is alert.  Psychiatric:        Mood and Affect: Mood normal.        Behavior: Behavior normal.           Assessment & Plan:

## 2021-12-03 NOTE — Assessment & Plan Note (Signed)
Seems to be compensated No diuretics now Metoprolol 25mg  daily for rate control

## 2021-12-06 ENCOUNTER — Other Ambulatory Visit: Payer: Self-pay | Admitting: Cardiovascular Disease

## 2021-12-06 ENCOUNTER — Telehealth: Payer: Self-pay | Admitting: Internal Medicine

## 2021-12-06 NOTE — Telephone Encounter (Signed)
Home Health verbal orders Caller Name: Eloy Name: Moreen Fowler number: [phone]  Requesting OT/PT/Skilled nursing/Social Work/Speech: hh  Reason: yeast   Frequency: 1w6 2PRN  Please forward to [facility] pool or providers CMA

## 2021-12-07 ENCOUNTER — Other Ambulatory Visit: Payer: Self-pay

## 2021-12-07 ENCOUNTER — Encounter: Payer: Self-pay | Admitting: Adult Health

## 2021-12-07 ENCOUNTER — Ambulatory Visit: Payer: Medicare PPO | Admitting: Adult Health

## 2021-12-07 DIAGNOSIS — J4551 Severe persistent asthma with (acute) exacerbation: Secondary | ICD-10-CM

## 2021-12-07 DIAGNOSIS — R5381 Other malaise: Secondary | ICD-10-CM

## 2021-12-07 DIAGNOSIS — M0579 Rheumatoid arthritis with rheumatoid factor of multiple sites without organ or systems involvement: Secondary | ICD-10-CM

## 2021-12-07 DIAGNOSIS — I5032 Chronic diastolic (congestive) heart failure: Secondary | ICD-10-CM | POA: Diagnosis not present

## 2021-12-07 NOTE — Progress Notes (Signed)
Reviewed and agree with assessment/plan.   Chesley Mires, MD [facility] Pulmonary/Critical Care 12/07/2021, 4:04 PM Pager:  [phone]

## 2021-12-07 NOTE — Assessment & Plan Note (Signed)
Recent exacerbation with  Covid infection - now improving .  He did require multiple courses of antibiotics and steroids.  I do recommend that he begin on Symbicort.  As part of his maintenance regimen.  Continue on Xolair.  Plan  Patient Instructions  Begin Symbicort 160 2 puffs Twice daily  , rinse after use.  Flutter valve Twice daily  . Albuterol inhaler As needed   Continue on Xolair .  Mucinex DM Twice daily  As needed cough/congestion  Activity as tolerated, Home PT.  Follow up with Dr. Halford Chessman or Nikea Settle NP  in 2-3 months  and As needed   Please contact office for sooner follow up if symptoms do not improve or worsen or seek emergency care

## 2021-12-07 NOTE — Patient Instructions (Addendum)
Begin Symbicort 160 2 puffs Twice daily  , rinse after use.  Flutter valve Twice daily  . Albuterol inhaler As needed   Continue on Xolair .  Mucinex DM Twice daily  As needed cough/congestion  Activity as tolerated, Home PT.  Follow up with Dr. Halford Chessman or Erinne Gillentine NP  in 2-3 months  and As needed   Please contact office for sooner follow up if symptoms do not improve or worsen or seek emergency care

## 2021-12-07 NOTE — Assessment & Plan Note (Signed)
Continue on current regimen and follow-up with rheumatology

## 2021-12-07 NOTE — Progress Notes (Signed)
@Patient  ID: [name], male    DOB: 1934-05-08, 88 y.o.   MRN: [id number]  Chief Complaint  Patient presents with   Follow-up    Referring provider: [name], MD  HPI: 88 year old male never smoker followed for moderate persistent allergic asthma on Xolair History of rheumatoid arthritis on methotrexate and Plaquenil History of neurogenic bladder on chronic Foley Atrial fibrillation on chronic anticoagulation with Eliquis  TEST/EVENTS :  PFT 04/07/10 >> FEV1 2.61 (86%), FEV1% 73, TLC 7.26 (85%), TLC 112% CT chest 03/15/17 > atherosclerosis, 3.4 cm Lt lung base density rounded ATX, granuloma in lingula, 3 mm RLL nodule, 4 mm RLL nodule CT chest 09/18/17 >> atherosclerosis, rounded ATX LLL, stable 4 mm RLL nodule, calcified granuloma in lingula, stable  4 mm RLL nodule   Cardiac Tests:  Echo 04/28/20 >> EF 60 to 65%, mod LVH, grade 2 DD, mild AS, aortic root 42 mm  12/07/2021 Follow up : Asthma , PNA  Patient returns for a 3-week follow-up.  Patient was seen last visit for recent hospitalization in October for a urinary tract infection and COVID-19 infection.  He was treated with empiric antibiotics.  He did require discharge to rehab for physical therapy.  He is back at home.  Is now undergoing home physical therapy.  Patient had difficulties with ongoing cough and congestion.  He had been treated with several different antibiotics and steroid tapers.  Last visit patient was recommended to finish up doxycycline and prednisone.  He was recommended to begin Symbicort twice daily for asthma management and continue on Xolair.  Since last visit patient says he is feeling better.  Cough and congestion have decreased.  Shortness of breath is decreased.  Unfortunately he did not start on Symbicort he is using albuterol twice daily. Chest x-ray last visit showed no acute process. Influenza and COVID vaccines are up-to-date PT at home. [place] bound, uses a walker.  Daughter is helping him at  home.  Being evaluated for decubital ulcers and yeast infection .   Has chronic lower extremity swelling.  Is on Lasix.  Uses support stockings.    Allergies  Allergen Reactions   Chlorhexidine    Ciprofloxacin Nausea And Vomiting    Headache   Citalopram Hydrobromide Other (See Comments)    "unknown"   Clindamycin Nausea And Vomiting   Lorazepam Other (See Comments)    Adverse reaction   Paroxetine Nausea Only   Ramipril Other (See Comments)    "unknown"   Simvastatin    Sulfa Antibiotics Other (See Comments)    Immunization History  Administered Date(s) Administered   Fluad Quad(high Dose 65+) 09/05/2019, 09/27/2021   Influenza Split 09/26/2011   Influenza Whole 09/07/2007, 08/21/2008, 09/01/2009, 08/23/2010   Influenza, High Dose Seasonal PF 08/24/2020   Influenza,inj,Quad PF,6+ Mos 09/04/2013, 07/28/2014, 08/20/2015, 08/08/2016, 09/05/2017, 09/03/2018   Influenza-Unspecified 09/06/2012   PFIZER(Purple Top)SARS-COV-2 Vaccination 12/17/2019, 01/11/2020, 10/17/2020   Pneumococcal Conjugate-13 07/28/2014   Pneumococcal Polysaccharide-23 04/09/2012   Td 07/13/2009    Past Medical History:  Diagnosis Date   (HFpEF) heart failure with preserved ejection fraction ([facility])    a. 02/2019 Echo: Ef 60-65%, mildly reduced RV fxn. RVSP 52mmHg. Mildly dil LA. Mod dil RA. Mild to mod TR. Mild to mod AS. Triv AI.   Allergy    Anemia    Anxiety    Arthritis    rheumatoid   Asthma    Basal cell carcinoma    back   CAD (coronary artery  disease)    CHF (congestive heart failure) (H[facility])    Chronic kidney disease    Mass right kidney   Chronic sinusitis    Collagen vascular disease (HCC)    Rheumatoid Arthritis   Depression    Diverticulitis    Dysrhythmia    ED (erectile dysfunction)    GERD (gastroesophageal reflux disease)    History of SIADH    Hx of adenomatous colonic polyps    Hypertension    Hyponatremia    IBS (irritable bowel syndrome)    Neurodermatitis     Neurogenic bladder    OSA (obstructive sleep apnea)    no CPAP since weight loss   Persistent atrial fibrillation ([place])    a. Dx 02/2019; b. CHA2DS2VASc = 6-->eliquis initiated.   Prostate cancer [facility])    Renal mass    10/2018 4.2cm R renal cortical mass. Most compatible w/ clear cell renal cell carcinoma.    Tobacco History: Social History   Tobacco Use  Smoking Status Never  Smokeless Tobacco Never   Counseling given: Not Answered   Outpatient Medications Prior to Visit  Medication Sig Dispense Refill   acetaminophen (TYLENOL) 500 MG tablet Take 500 mg by mouth 2 (two) times daily. May take an additional 500mg  as needed for arthritis     albuterol (VENTOLIN HFA) 108 (90 Base) MCG/ACT inhaler Inhale 2 puffs into the lungs every 6 (six) hours as needed for wheezing or shortness of breath. 8 g 2   ALPRAZolam (XANAX) 0.5 MG tablet Take 1 tablet (0.5 mg total) by mouth at bedtime. 3 tablet 0   apixaban (ELIQUIS) 5 MG TABS tablet Take 1 tablet (5 mg total) by mouth 2 (two) times daily. 60 tablet    Ascorbic Acid (VITAMIN C PO) Take 1,000 mg by mouth daily.      cetirizine (ZYRTEC) 10 MG tablet Take 10 mg by mouth at bedtime.     chlorhexidine (PERIDEX) 0.12 % solution 1 mL by Mouth Rinse route as needed.     Cholecalciferol (VITAMIN D3) 5000 UNITS CAPS Take 2,000 Units by mouth daily.      Coenzyme Q10 (CO Q 10) 100 MG CAPS Take 100 mg by mouth daily.      CRANBERRY SOFT PO Take 250 mg by mouth daily.     EPINEPHrine 0.3 mg/0.3 mL IJ SOAJ injection Inject 0.3 mg into the muscle as needed for anaphylaxis.     fluconazole (DIFLUCAN) 100 MG tablet Take 1 tablet (100 mg total) by mouth daily. 7 tablet 1   fluticasone (FLONASE) 50 MCG/ACT nasal spray Place 2 sprays into both nostrils 2 (two) times daily. 48 g 3   folic acid (FOLVITE) 195 MCG tablet Take 1,247 mcg by mouth daily.     gabapentin (NEURONTIN) 600 MG tablet Take 600 mg by mouth 2 (two) times daily at 8 am and 10 pm.      hydroxychloroquine (PLAQUENIL) 200 MG tablet Take 200 mg by mouth 2 (two) times daily.      ketoconazole (NIZORAL) 2 % cream Apply 1 application topically 2 (two) times daily. 30 g 0   ketoconazole (NIZORAL) 2 % shampoo Apply 1 application topically 2 (two) times a week.     methotrexate (RHEUMATREX) 2.5 MG tablet Take 10 tablets by mouth once a week. Taken on Wednesdays     metoprolol succinate (TOPROL-XL) 25 MG 24 hr tablet Take 0.5 tablets (12.5 mg total) by mouth 2 (two) times daily. 30 tablet 0   Multiple  Vitamin (MULTIVITAMIN) tablet Take 1 tablet by mouth daily.     Multiple Vitamins-Minerals (CENTRUM FRESH/FRUITY 50+ PO) Take 1 tablet by mouth daily.     MYRBETRIQ 50 MG TB24 tablet TAKE 1 TABLET(50 MG) BY MOUTH DAILY (Patient taking differently: Take 50 mg by mouth.) 30 tablet 3   nystatin cream (MYCOSTATIN) Apply 1 application topically 2 (two) times daily. 30 g 0   omeprazole (PRILOSEC) 20 MG capsule Take 1 capsule (20 mg total) by mouth daily as needed. 90 capsule 1   polyethylene glycol powder (GLYCOLAX/MIRALAX) 17 GM/SCOOP powder Take 17 g by mouth at bedtime.     pravastatin (PRAVACHOL) 20 MG tablet TAKE 1 TABLET(20 MG) BY MOUTH AT BEDTIME (Patient taking differently: Take 20 mg by mouth daily.) 90 tablet 3   Probiotic Product (ALIGN) 4 MG CAPS Take 1 capsule (4 mg total) by mouth daily. 15 capsule 0   traZODone (DESYREL) 50 MG tablet Take 50 mg by mouth at bedtime.     triamcinolone ointment (KENALOG) 0.1 % Apply 1 application topically as needed.     Turmeric 500 MG CAPS Take 500 mg by mouth daily.      Zinc 50 MG TABS Take by mouth.     dextromethorphan-guaiFENesin (MUCINEX DM) 30-600 MG 12hr tablet Take 1 tablet by mouth 2 (two) times daily as needed for cough. (Patient not taking: Reported on 12/07/2021)     Omega-3 Fatty Acids (FISH OIL) 1200 MG CAPS Take 1,200 mg by mouth daily. (Patient not taking: Reported on 12/07/2021)     No facility-administered medications prior to  visit.     Review of Systems:   Constitutional:   No  weight loss, night sweats,  Fevers, chills,  +fatigue, or  lassitude.  HEENT:   No headaches,  Difficulty swallowing,  Tooth/dental problems, or  Sore throat,                No sneezing, itching, ear ache, nasal congestion, post nasal drip,   CV:  No chest pain,  Orthopnea, PND,  +swelling in lower extremities,  no anasarca, dizziness, palpitations, syncope.   GI  No heartburn, indigestion, abdominal pain, nausea, vomiting, diarrhea, change in bowel habits, loss of appetite, bloody stools.   Resp:   No chest wall deformity  Skin: no rash or lesions.  GU: no dysuria, change in color of urine, no urgency or frequency.  No flank pain, no hematuria   MS:  No joint pain or swelling.  No decreased range of motion.  No back pain.    Physical Exam  BP (!) 90/50 (BP Location: Left Arm, Patient Position: Sitting, Cuff Size: Normal)    Pulse (!) 101    Temp 98.2 F (36.8 C) (Oral)    Ht 5' 10.8" (1.798 m)    SpO2 96%    BMI 28.05 kg/m   GEN: A/Ox3; pleasant , NAD, frail and elderly in wc    HEENT:  [place]/AT,  EACs-clear, TMs-wnl, NOSE-clear, THROAT-clear, no lesions, no postnasal drip or exudate noted.   NECK:  Supple w/ fair ROM; no JVD; normal carotid impulses w/o bruits; no thyromegaly or nodules palpated; no lymphadenopathy.    RESP  Clear  P & A; w/o, wheezes/ rales/ or rhonchi. no accessory muscle use, no dullness to percussion  CARD:  RRR, no m/r/g, no peripheral edema, pulses intact, no cyanosis or clubbing.  GI:   Soft & nt; nml bowel sounds; no organomegaly or masses detected.   Musco: Warm bil,  Arthritic changes in hands.   Neuro: alert, no focal deficits noted.    Skin: Warm, no lesions or rashes    Lab Results:  CBC  BMET   BNP   ProBNP No results found for: PROBNP  Imaging: DG Chest 2 View  Result Date: 11/16/2021 CLINICAL DATA:  Pneumonia. EXAM: CHEST - 2 VIEW COMPARISON:  Chest radiograph  dated 10/05/2021. FINDINGS: Shallow inspiration with bibasilar atelectasis. No focal consolidation, pleural effusion, or pneumothorax. The cardiac silhouette is within limits. Atherosclerotic calcification of the aorta. Degenerative changes of the spine and shoulders. No acute osseous pathology. IMPRESSION: No active cardiopulmonary disease. Electronically Signed   By: Anner Crete M.D.   On: 11/16/2021 18:01   CT HEAD WO CONTRAST  Result Date: 11/12/2021 CLINICAL DATA:  88 year old male with slurred speech. Possible transient ischemic attack. EXAM: CT HEAD WITHOUT CONTRAST TECHNIQUE: Contiguous axial images were obtained from the base of the skull through the vertex without intravenous contrast. COMPARISON:  Head CT 04/02/2021. FINDINGS: Brain: Moderate cerebral atrophy. Patchy and confluent areas of decreased attenuation are noted throughout the deep and periventricular white matter of the cerebral hemispheres bilaterally, compatible with chronic microvascular ischemic disease. No evidence of acute infarction, hemorrhage, hydrocephalus, extra-axial collection or mass lesion/mass effect. Vascular: No hyperdense vessel or unexpected calcification. Skull: Normal. Negative for fracture or focal lesion. Sinuses/Orbits: Extensive mucoperiosteal thickening throughout the paranasal sinuses, most severe in the ethmoid sinuses bilaterally, sphenoid sinuses bilaterally, and the right maxillary sinus. Air-fluid levels are present in the right maxillary and right sphenoid sinus. Other: None. IMPRESSION: 1. No acute intracranial abnormalities. 2. Changes of chronic sinusitis, with additional air-fluid levels in the right maxillary and right sphenoid sinus which may suggest an acute sinusitis. Clinical correlation is recommended. 3. Moderate cerebral atrophy with extensive chronic microvascular ischemic changes in the cerebral white matter, as above. Electronically Signed   By: Vinnie Langton M.D.   On: 11/12/2021  04:58   MR BRAIN WO CONTRAST  Result Date: 11/12/2021 CLINICAL DATA:  Neuro deficit with acute stroke suspected. EXAM: MRI HEAD WITHOUT CONTRAST TECHNIQUE: Multiplanar, multiecho pulse sequences of the brain and surrounding structures were obtained without intravenous contrast. COMPARISON:  Head CT from earlier today FINDINGS: Brain: No acute infarction, hemorrhage, hydrocephalus, extra-axial collection or mass lesion. Small remote cortically based infarction at the high right vertex. Vascular: Normal flow voids Skull and upper cervical spine: Normal marrow signal Sinuses/Orbits: Generalized mucosal thickening in the paranasal sinuses with right maxillary fluid level. Negative orbits. IMPRESSION: 1. No acute finding such as infarct. 2. Small remote right frontal cortex infarct. 3. Active/acute sinusitis. Electronically Signed   By: Jorje Guild M.D.   On: 11/12/2021 07:26    omalizumab Arvid Right) prefilled syringe 375 mg     Date Action Dose Route User   Discharged on 11/12/2021   Admitted on 11/12/2021   Discharged on 10/19/2021   Admitted on 10/19/2021   10/13/2021 1443 Given 375 mg Subcutaneous (Abdominal Tissue) Oren Beckmann, RN      omalizumab Arvid Right) prefilled syringe 375 mg     Date Action Dose Route User   Discharged on 11/12/2021   Admitted on 11/12/2021   10/27/2021 1447 Given 375 mg Subcutaneous (Abdominal Tissue) Ranabhat, Sabitri, RN      omalizumab Arvid Right) prefilled syringe 375 mg     Date Action Dose Route User   11/16/2021 1541 Given 375 mg Subcutaneous (Abdominal Tissue) Oren Beckmann, RN      omalizumab Arvid Right) prefilled syringe 375  mg     Date Action Dose Route User   11/30/2021 1510 Given 375 mg Subcutaneous (Abdominal Tissue) Oren Beckmann, RN       No flowsheet data found.  No results found for: NITRICOXIDE      Assessment & Plan:   Severe persistent asthma Recent exacerbation with  Covid infection - now improving .  He  did require multiple courses of antibiotics and steroids.  I do recommend that he begin on Symbicort.  As part of his maintenance regimen.  Continue on Xolair.  Plan  Patient Instructions  Begin Symbicort 160 2 puffs Twice daily  , rinse after use.  Flutter valve Twice daily  . Albuterol inhaler As needed   Continue on Xolair .  Mucinex DM Twice daily  As needed cough/congestion  Activity as tolerated, Home PT.  Follow up with Dr. Halford Chessman or Riham Polyakov NP  in 2-3 months  and As needed   Please contact office for sooner follow up if symptoms do not improve or worsen or seek emergency care        Rheumatoid arthritis involving multiple sites with positive rheumatoid factor ([place]) Continue on current regimen and follow-up with rheumatology  Chronic diastolic CHF (congestive heart failure) ([place]) Appears compensated.  Patient does have chronic lower extremity edema.  Have recommended continued on TED support stockings.  Low-salt diet.  Continue on current regimen and follow-up cardiology  Physical deconditioning Patient to continue with home physical therapy and Occupational Therapy.     Rexene Edison, NP 12/07/2021

## 2021-12-07 NOTE — Assessment & Plan Note (Signed)
Patient to continue with home physical therapy and Occupational Therapy.

## 2021-12-07 NOTE — Telephone Encounter (Signed)
Left orders on verified VM

## 2021-12-07 NOTE — Assessment & Plan Note (Signed)
Appears compensated.  Patient does have chronic lower extremity edema.  Have recommended continued on TED support stockings.  Low-salt diet.  Continue on current regimen and follow-up cardiology

## 2021-12-10 ENCOUNTER — Other Ambulatory Visit: Payer: Self-pay

## 2021-12-10 ENCOUNTER — Encounter: Payer: Self-pay | Admitting: Emergency Medicine

## 2021-12-10 ENCOUNTER — Emergency Department: Payer: Medicare PPO

## 2021-12-10 ENCOUNTER — Telehealth: Payer: Self-pay | Admitting: Internal Medicine

## 2021-12-10 DIAGNOSIS — I5033 Acute on chronic diastolic (congestive) heart failure: Secondary | ICD-10-CM | POA: Diagnosis present

## 2021-12-10 DIAGNOSIS — Z8249 Family history of ischemic heart disease and other diseases of the circulatory system: Secondary | ICD-10-CM

## 2021-12-10 DIAGNOSIS — Z9079 Acquired absence of other genital organ(s): Secondary | ICD-10-CM

## 2021-12-10 DIAGNOSIS — N139 Obstructive and reflux uropathy, unspecified: Secondary | ICD-10-CM | POA: Diagnosis present

## 2021-12-10 DIAGNOSIS — N179 Acute kidney failure, unspecified: Secondary | ICD-10-CM | POA: Diagnosis present

## 2021-12-10 DIAGNOSIS — Z20822 Contact with and (suspected) exposure to covid-19: Secondary | ICD-10-CM | POA: Diagnosis present

## 2021-12-10 DIAGNOSIS — N189 Chronic kidney disease, unspecified: Secondary | ICD-10-CM | POA: Diagnosis present

## 2021-12-10 DIAGNOSIS — N281 Cyst of kidney, acquired: Secondary | ICD-10-CM | POA: Diagnosis present

## 2021-12-10 DIAGNOSIS — G4700 Insomnia, unspecified: Secondary | ICD-10-CM | POA: Diagnosis present

## 2021-12-10 DIAGNOSIS — Z8546 Personal history of malignant neoplasm of prostate: Secondary | ICD-10-CM

## 2021-12-10 DIAGNOSIS — L89329 Pressure ulcer of left buttock, unspecified stage: Secondary | ICD-10-CM | POA: Diagnosis present

## 2021-12-10 DIAGNOSIS — I130 Hypertensive heart and chronic kidney disease with heart failure and stage 1 through stage 4 chronic kidney disease, or unspecified chronic kidney disease: Secondary | ICD-10-CM | POA: Diagnosis present

## 2021-12-10 DIAGNOSIS — Z85828 Personal history of other malignant neoplasm of skin: Secondary | ICD-10-CM

## 2021-12-10 DIAGNOSIS — I4819 Other persistent atrial fibrillation: Secondary | ICD-10-CM | POA: Diagnosis present

## 2021-12-10 DIAGNOSIS — I2510 Atherosclerotic heart disease of native coronary artery without angina pectoris: Secondary | ICD-10-CM | POA: Diagnosis present

## 2021-12-10 DIAGNOSIS — R4182 Altered mental status, unspecified: Secondary | ICD-10-CM | POA: Diagnosis not present

## 2021-12-10 DIAGNOSIS — Z7901 Long term (current) use of anticoagulants: Secondary | ICD-10-CM

## 2021-12-10 DIAGNOSIS — W19XXXA Unspecified fall, initial encounter: Secondary | ICD-10-CM | POA: Diagnosis not present

## 2021-12-10 DIAGNOSIS — Z905 Acquired absence of kidney: Secondary | ICD-10-CM

## 2021-12-10 DIAGNOSIS — Z882 Allergy status to sulfonamides status: Secondary | ICD-10-CM | POA: Diagnosis not present

## 2021-12-10 DIAGNOSIS — I350 Nonrheumatic aortic (valve) stenosis: Secondary | ICD-10-CM | POA: Diagnosis present

## 2021-12-10 DIAGNOSIS — Z888 Allergy status to other drugs, medicaments and biological substances status: Secondary | ICD-10-CM

## 2021-12-10 DIAGNOSIS — Z743 Need for continuous supervision: Secondary | ICD-10-CM | POA: Diagnosis not present

## 2021-12-10 DIAGNOSIS — J9601 Acute respiratory failure with hypoxia: Secondary | ICD-10-CM | POA: Diagnosis not present

## 2021-12-10 DIAGNOSIS — D509 Iron deficiency anemia, unspecified: Secondary | ICD-10-CM | POA: Diagnosis present

## 2021-12-10 DIAGNOSIS — I452 Bifascicular block: Secondary | ICD-10-CM | POA: Diagnosis present

## 2021-12-10 DIAGNOSIS — Z808 Family history of malignant neoplasm of other organs or systems: Secondary | ICD-10-CM

## 2021-12-10 DIAGNOSIS — I4891 Unspecified atrial fibrillation: Secondary | ICD-10-CM | POA: Diagnosis not present

## 2021-12-10 DIAGNOSIS — I10 Essential (primary) hypertension: Secondary | ICD-10-CM | POA: Diagnosis not present

## 2021-12-10 DIAGNOSIS — I5031 Acute diastolic (congestive) heart failure: Secondary | ICD-10-CM | POA: Diagnosis not present

## 2021-12-10 DIAGNOSIS — G4733 Obstructive sleep apnea (adult) (pediatric): Secondary | ICD-10-CM | POA: Diagnosis present

## 2021-12-10 DIAGNOSIS — R531 Weakness: Secondary | ICD-10-CM

## 2021-12-10 DIAGNOSIS — Z7401 Bed confinement status: Secondary | ICD-10-CM | POA: Diagnosis not present

## 2021-12-10 DIAGNOSIS — I959 Hypotension, unspecified: Secondary | ICD-10-CM | POA: Diagnosis not present

## 2021-12-10 DIAGNOSIS — E861 Hypovolemia: Secondary | ICD-10-CM | POA: Diagnosis present

## 2021-12-10 DIAGNOSIS — Z96653 Presence of artificial knee joint, bilateral: Secondary | ICD-10-CM | POA: Diagnosis present

## 2021-12-10 DIAGNOSIS — M069 Rheumatoid arthritis, unspecified: Secondary | ICD-10-CM | POA: Diagnosis present

## 2021-12-10 DIAGNOSIS — F419 Anxiety disorder, unspecified: Secondary | ICD-10-CM | POA: Diagnosis present

## 2021-12-10 DIAGNOSIS — Z79899 Other long term (current) drug therapy: Secondary | ICD-10-CM

## 2021-12-10 DIAGNOSIS — R0609 Other forms of dyspnea: Secondary | ICD-10-CM | POA: Diagnosis not present

## 2021-12-10 DIAGNOSIS — J811 Chronic pulmonary edema: Secondary | ICD-10-CM | POA: Diagnosis not present

## 2021-12-10 DIAGNOSIS — N390 Urinary tract infection, site not specified: Secondary | ICD-10-CM | POA: Diagnosis present

## 2021-12-10 DIAGNOSIS — I480 Paroxysmal atrial fibrillation: Secondary | ICD-10-CM | POA: Diagnosis not present

## 2021-12-10 DIAGNOSIS — I4821 Permanent atrial fibrillation: Secondary | ICD-10-CM | POA: Diagnosis not present

## 2021-12-10 DIAGNOSIS — J189 Pneumonia, unspecified organism: Secondary | ICD-10-CM | POA: Diagnosis not present

## 2021-12-10 DIAGNOSIS — E785 Hyperlipidemia, unspecified: Secondary | ICD-10-CM | POA: Diagnosis present

## 2021-12-10 DIAGNOSIS — L89301 Pressure ulcer of unspecified buttock, stage 1: Secondary | ICD-10-CM | POA: Diagnosis present

## 2021-12-10 DIAGNOSIS — R5381 Other malaise: Secondary | ICD-10-CM | POA: Diagnosis not present

## 2021-12-10 DIAGNOSIS — K219 Gastro-esophageal reflux disease without esophagitis: Secondary | ICD-10-CM | POA: Diagnosis present

## 2021-12-10 DIAGNOSIS — G903 Multi-system degeneration of the autonomic nervous system: Secondary | ICD-10-CM | POA: Diagnosis not present

## 2021-12-10 DIAGNOSIS — D500 Iron deficiency anemia secondary to blood loss (chronic): Secondary | ICD-10-CM | POA: Diagnosis not present

## 2021-12-10 DIAGNOSIS — Z9181 History of falling: Secondary | ICD-10-CM

## 2021-12-10 DIAGNOSIS — I501 Left ventricular failure: Secondary | ICD-10-CM | POA: Diagnosis not present

## 2021-12-10 LAB — CBC WITH DIFFERENTIAL/PLATELET
Abs Immature Granulocytes: 0.09 10*3/uL — ABNORMAL HIGH (ref 0.00–0.07)
Basophils Absolute: 0.1 10*3/uL (ref 0.0–0.1)
Basophils Relative: 1 %
Eosinophils Absolute: 0.2 10*3/uL (ref 0.0–0.5)
Eosinophils Relative: 3 %
HCT: 28.8 % — ABNORMAL LOW (ref 39.0–52.0)
Hemoglobin: 9.2 g/dL — ABNORMAL LOW (ref 13.0–17.0)
Immature Granulocytes: 1 %
Lymphocytes Relative: 12 %
Lymphs Abs: 0.7 10*3/uL (ref 0.7–4.0)
MCH: 32.6 pg (ref 26.0–34.0)
MCHC: 31.9 g/dL (ref 30.0–36.0)
MCV: 102.1 fL — ABNORMAL HIGH (ref 80.0–100.0)
Monocytes Absolute: 0.3 10*3/uL (ref 0.1–1.0)
Monocytes Relative: 5 %
Neutro Abs: 4.9 10*3/uL (ref 1.7–7.7)
Neutrophils Relative %: 78 %
Platelets: 209 10*3/uL (ref 150–400)
RBC: 2.82 MIL/uL — ABNORMAL LOW (ref 4.22–5.81)
RDW: 15.9 % — ABNORMAL HIGH (ref 11.5–15.5)
WBC: 6.2 10*3/uL (ref 4.0–10.5)
nRBC: 0 % (ref 0.0–0.2)

## 2021-12-10 LAB — COMPREHENSIVE METABOLIC PANEL
ALT: 11 U/L (ref 0–44)
AST: 19 U/L (ref 15–41)
Albumin: 3.2 g/dL — ABNORMAL LOW (ref 3.5–5.0)
Alkaline Phosphatase: 56 U/L (ref 38–126)
Anion gap: 9 (ref 5–15)
BUN: 28 mg/dL — ABNORMAL HIGH (ref 8–23)
CO2: 33 mmol/L — ABNORMAL HIGH (ref 22–32)
Calcium: 8.4 mg/dL — ABNORMAL LOW (ref 8.9–10.3)
Chloride: 99 mmol/L (ref 98–111)
Creatinine, Ser: 1.41 mg/dL — ABNORMAL HIGH (ref 0.61–1.24)
GFR, Estimated: 49 mL/min — ABNORMAL LOW (ref >60)
Glucose, Bld: 95 mg/dL (ref 70–99)
Potassium: 4 mmol/L (ref 3.5–5.1)
Sodium: 141 mmol/L (ref 135–145)
Total Bilirubin: 0.6 mg/dL (ref 0.3–1.2)
Total Protein: 6.2 g/dL — ABNORMAL LOW (ref 6.5–8.1)

## 2021-12-10 LAB — URINALYSIS, COMPLETE (UACMP) WITH MICROSCOPIC
Bilirubin Urine: NEGATIVE
Glucose, UA: NEGATIVE mg/dL
Ketones, ur: NEGATIVE mg/dL
Nitrite: POSITIVE — AB
Protein, ur: 30 mg/dL — AB
Specific Gravity, Urine: 1.025 (ref 1.005–1.030)
pH: 6 (ref 5.0–8.0)

## 2021-12-10 LAB — TROPONIN I (HIGH SENSITIVITY): Troponin I (High Sensitivity): 32 ng/L — ABNORMAL HIGH (ref <18)

## 2021-12-10 NOTE — ED Provider Triage Note (Signed)
Emergency Medicine Provider Triage Evaluation Note  [name] , a 88 y.o. male  was evaluated in triage.  Pt complains of generalized weakness for the past 5 days.  Patient has been unable to stand and ambulate which is atypical for him.  Patient denies headache, chest pain, chest tightness or abdominal pain.  Review of Systems  Positive: Patient has generalized weakness.  Negative: No chest pain or abdominal pain.   Physical Exam  There were no vitals taken for this visit. Gen:   Awake, no distress   Resp:  Normal effort  MSK:   Moves extremities without difficulty  Other:    Medical Decision Making  Medically screening exam initiated at 4:38 PM.  Appropriate orders placed.  [name] was informed that the remainder of the evaluation will be completed by another provider, this initial triage assessment does not replace that evaluation, and the importance of remaining in the ED until their evaluation is complete.     Vallarie Mare [place], [place] 12/10/21 1653

## 2021-12-10 NOTE — Telephone Encounter (Signed)
[facility] called they need verbal orders for PT  1wk -1 2wk -3 1wk- 2

## 2021-12-10 NOTE — Telephone Encounter (Signed)
Verbal orders given to Connie. 

## 2021-12-10 NOTE — ED Triage Notes (Signed)
Patient to ED via [facility] from home for generalized weakness. Patient states he recently left rehab in Dec 17 after hospitalization and was doing well. This week family noticed decline in mobility from baseline. History of TIA's- family concerned for same. Patient Aox4.

## 2021-12-10 NOTE — ED Triage Notes (Signed)
First Nurse Note:  Arrives from home via [facility].  C?O weakness.  Per report, family noticed a decline in mobility over the past week.  127/45 96% -- 2L 75

## 2021-12-11 ENCOUNTER — Encounter: Payer: Self-pay | Admitting: Family Medicine

## 2021-12-11 ENCOUNTER — Inpatient Hospital Stay
Admission: EM | Admit: 2021-12-11 | Discharge: 2021-12-16 | DRG: 291 | Disposition: A | Discharge status: Skilled Nursing Facility | Payer: Medicare PPO | Attending: Student | Admitting: Student

## 2021-12-11 ENCOUNTER — Inpatient Hospital Stay: Admit: 2021-12-11 | Payer: Medicare PPO

## 2021-12-11 DIAGNOSIS — I5033 Acute on chronic diastolic (congestive) heart failure: Secondary | ICD-10-CM | POA: Diagnosis present

## 2021-12-11 DIAGNOSIS — I130 Hypertensive heart and chronic kidney disease with heart failure and stage 1 through stage 4 chronic kidney disease, or unspecified chronic kidney disease: Secondary | ICD-10-CM | POA: Diagnosis present

## 2021-12-11 DIAGNOSIS — I5031 Acute diastolic (congestive) heart failure: Secondary | ICD-10-CM

## 2021-12-11 DIAGNOSIS — D500 Iron deficiency anemia secondary to blood loss (chronic): Secondary | ICD-10-CM | POA: Diagnosis not present

## 2021-12-11 DIAGNOSIS — I452 Bifascicular block: Secondary | ICD-10-CM | POA: Diagnosis present

## 2021-12-11 DIAGNOSIS — G4700 Insomnia, unspecified: Secondary | ICD-10-CM | POA: Diagnosis present

## 2021-12-11 DIAGNOSIS — I959 Hypotension, unspecified: Secondary | ICD-10-CM | POA: Diagnosis not present

## 2021-12-11 DIAGNOSIS — N189 Chronic kidney disease, unspecified: Secondary | ICD-10-CM | POA: Diagnosis present

## 2021-12-11 DIAGNOSIS — J189 Pneumonia, unspecified organism: Secondary | ICD-10-CM | POA: Diagnosis not present

## 2021-12-11 DIAGNOSIS — J9601 Acute respiratory failure with hypoxia: Secondary | ICD-10-CM

## 2021-12-11 DIAGNOSIS — R0609 Other forms of dyspnea: Secondary | ICD-10-CM | POA: Diagnosis not present

## 2021-12-11 DIAGNOSIS — I4821 Permanent atrial fibrillation: Secondary | ICD-10-CM | POA: Diagnosis not present

## 2021-12-11 DIAGNOSIS — Z882 Allergy status to sulfonamides status: Secondary | ICD-10-CM | POA: Diagnosis not present

## 2021-12-11 DIAGNOSIS — M069 Rheumatoid arthritis, unspecified: Secondary | ICD-10-CM | POA: Diagnosis present

## 2021-12-11 DIAGNOSIS — E785 Hyperlipidemia, unspecified: Secondary | ICD-10-CM | POA: Diagnosis present

## 2021-12-11 DIAGNOSIS — I4891 Unspecified atrial fibrillation: Secondary | ICD-10-CM | POA: Diagnosis not present

## 2021-12-11 DIAGNOSIS — N179 Acute kidney failure, unspecified: Secondary | ICD-10-CM

## 2021-12-11 DIAGNOSIS — Z905 Acquired absence of kidney: Secondary | ICD-10-CM | POA: Diagnosis not present

## 2021-12-11 DIAGNOSIS — E861 Hypovolemia: Secondary | ICD-10-CM | POA: Diagnosis present

## 2021-12-11 DIAGNOSIS — G903 Multi-system degeneration of the autonomic nervous system: Secondary | ICD-10-CM | POA: Diagnosis not present

## 2021-12-11 DIAGNOSIS — N139 Obstructive and reflux uropathy, unspecified: Secondary | ICD-10-CM | POA: Diagnosis present

## 2021-12-11 DIAGNOSIS — N390 Urinary tract infection, site not specified: Secondary | ICD-10-CM | POA: Diagnosis present

## 2021-12-11 DIAGNOSIS — Z888 Allergy status to other drugs, medicaments and biological substances status: Secondary | ICD-10-CM | POA: Diagnosis not present

## 2021-12-11 DIAGNOSIS — N281 Cyst of kidney, acquired: Secondary | ICD-10-CM | POA: Diagnosis present

## 2021-12-11 DIAGNOSIS — R531 Weakness: Secondary | ICD-10-CM | POA: Diagnosis present

## 2021-12-11 DIAGNOSIS — Z20822 Contact with and (suspected) exposure to covid-19: Secondary | ICD-10-CM | POA: Diagnosis present

## 2021-12-11 DIAGNOSIS — I350 Nonrheumatic aortic (valve) stenosis: Secondary | ICD-10-CM | POA: Diagnosis present

## 2021-12-11 DIAGNOSIS — I480 Paroxysmal atrial fibrillation: Secondary | ICD-10-CM | POA: Diagnosis not present

## 2021-12-11 DIAGNOSIS — D509 Iron deficiency anemia, unspecified: Secondary | ICD-10-CM

## 2021-12-11 DIAGNOSIS — L89329 Pressure ulcer of left buttock, unspecified stage: Secondary | ICD-10-CM | POA: Diagnosis present

## 2021-12-11 DIAGNOSIS — I2510 Atherosclerotic heart disease of native coronary artery without angina pectoris: Secondary | ICD-10-CM | POA: Diagnosis present

## 2021-12-11 DIAGNOSIS — Z7901 Long term (current) use of anticoagulants: Secondary | ICD-10-CM | POA: Diagnosis not present

## 2021-12-11 DIAGNOSIS — Z9079 Acquired absence of other genital organ(s): Secondary | ICD-10-CM | POA: Diagnosis not present

## 2021-12-11 DIAGNOSIS — L89301 Pressure ulcer of unspecified buttock, stage 1: Secondary | ICD-10-CM | POA: Diagnosis present

## 2021-12-11 DIAGNOSIS — I4819 Other persistent atrial fibrillation: Secondary | ICD-10-CM | POA: Diagnosis present

## 2021-12-11 LAB — BASIC METABOLIC PANEL
Anion gap: 7 (ref 5–15)
BUN: 24 mg/dL — ABNORMAL HIGH (ref 8–23)
CO2: 31 mmol/L (ref 22–32)
Calcium: 7.7 mg/dL — ABNORMAL LOW (ref 8.9–10.3)
Chloride: 98 mmol/L (ref 98–111)
Creatinine, Ser: 1.16 mg/dL (ref 0.61–1.24)
GFR, Estimated: >60 mL/min (ref >60)
Glucose, Bld: 103 mg/dL — ABNORMAL HIGH (ref 70–99)
Potassium: 3.6 mmol/L (ref 3.5–5.1)
Sodium: 136 mmol/L (ref 135–145)

## 2021-12-11 LAB — CBC
HCT: 23 % — ABNORMAL LOW (ref 39.0–52.0)
Hemoglobin: 7.4 g/dL — ABNORMAL LOW (ref 13.0–17.0)
MCH: 32.5 pg (ref 26.0–34.0)
MCHC: 32.2 g/dL (ref 30.0–36.0)
MCV: 100.9 fL — ABNORMAL HIGH (ref 80.0–100.0)
Platelets: 194 10*3/uL (ref 150–400)
RBC: 2.28 MIL/uL — ABNORMAL LOW (ref 4.22–5.81)
RDW: 15.9 % — ABNORMAL HIGH (ref 11.5–15.5)
WBC: 4.7 10*3/uL (ref 4.0–10.5)
nRBC: 0 % (ref 0.0–0.2)

## 2021-12-11 LAB — IRON AND TIBC
Iron: 29 ug/dL — ABNORMAL LOW (ref 45–182)
Saturation Ratios: 17 % — ABNORMAL LOW (ref 17.9–39.5)
TIBC: 168 ug/dL — ABNORMAL LOW (ref 250–450)
UIBC: 139 ug/dL

## 2021-12-11 LAB — BRAIN NATRIURETIC PEPTIDE: B Natriuretic Peptide: 585 pg/mL — ABNORMAL HIGH (ref 0.0–100.0)

## 2021-12-11 LAB — RESP PANEL BY RT-PCR (FLU A&B, COVID) ARPGX2
Influenza A by PCR: NEGATIVE
Influenza B by PCR: NEGATIVE
SARS Coronavirus 2 by RT PCR: NEGATIVE

## 2021-12-11 LAB — VITAMIN B12: Vitamin B-12: 1514 pg/mL — ABNORMAL HIGH (ref 180–914)

## 2021-12-11 LAB — PROCALCITONIN
Procalcitonin: 0.33 ng/mL
Procalcitonin: 0.19 ng/mL

## 2021-12-11 LAB — TROPONIN I (HIGH SENSITIVITY): Troponin I (High Sensitivity): 22 ng/L — ABNORMAL HIGH (ref <18)

## 2021-12-11 LAB — FERRITIN: Ferritin: 114 ng/mL (ref 24–336)

## 2021-12-11 LAB — LACTIC ACID, PLASMA: Lactic Acid, Venous: 1 mmol/L (ref 0.5–1.9)

## 2021-12-11 MED ORDER — TURMERIC 500 MG PO CAPS: 500.0000 mg | ORAL_CAPSULE | Freq: Every day | ORAL | Status: DC

## 2021-12-11 MED ORDER — METHOTREXATE 2.5 MG PO TABS
25.0000 mg | ORAL_TABLET | ORAL | Status: DC
  Administered 2021-12-15: 25 mg via ORAL
  Filled 2021-12-11: qty 10

## 2021-12-11 MED ORDER — FLUTICASONE PROPIONATE 50 MCG/ACT NA SUSP
2.0000 | Freq: Two times a day (BID) | NASAL | Status: DC
  Administered 2021-12-11 – 2021-12-16 (×11): 2 via NASAL
  Filled 2021-12-11: qty 16

## 2021-12-11 MED ORDER — TRAZODONE HCL 50 MG PO TABS
50.0000 mg | ORAL_TABLET | Freq: Every day | ORAL | Status: DC
  Administered 2021-12-11 – 2021-12-12 (×2): 50 mg via ORAL
  Filled 2021-12-11 (×2): qty 1

## 2021-12-11 MED ORDER — SODIUM CHLORIDE 0.9 % IV SOLN
2.0000 g | INTRAVENOUS | Status: DC
  Administered 2021-12-11: 2 g via INTRAVENOUS
  Filled 2021-12-11: qty 20
  Filled 2021-12-11: qty 2

## 2021-12-11 MED ORDER — ONDANSETRON HCL 4 MG PO TABS: 4.0000 mg | ORAL_TABLET | Freq: Four times a day (QID) | ORAL | Status: DC | PRN

## 2021-12-11 MED ORDER — TRAZODONE HCL 50 MG PO TABS
25.0000 mg | ORAL_TABLET | Freq: Every evening | ORAL | Status: DC | PRN
  Administered 2021-12-12: 25 mg via ORAL
  Filled 2021-12-11: qty 1

## 2021-12-11 MED ORDER — CO Q 10 100 MG PO CAPS: 100.0000 mg | ORAL_CAPSULE | Freq: Every day | ORAL | Status: DC

## 2021-12-11 MED ORDER — SODIUM CHLORIDE 0.9 % IV SOLN
2.0000 g | Freq: Once | INTRAVENOUS | Status: AC
  Administered 2021-12-11: 2 g via INTRAVENOUS
  Filled 2021-12-11: qty 20

## 2021-12-11 MED ORDER — LORATADINE 10 MG PO TABS
10.0000 mg | ORAL_TABLET | Freq: Every day | ORAL | Status: DC
  Administered 2021-12-11 – 2021-12-16 (×6): 10 mg via ORAL
  Filled 2021-12-11 (×6): qty 1

## 2021-12-11 MED ORDER — ACETAMINOPHEN 325 MG PO TABS
650.0000 mg | ORAL_TABLET | Freq: Four times a day (QID) | ORAL | Status: DC | PRN
  Administered 2021-12-11 – 2021-12-16 (×4): 650 mg via ORAL
  Filled 2021-12-11 (×5): qty 2

## 2021-12-11 MED ORDER — HYDROXYCHLOROQUINE SULFATE 200 MG PO TABS
200.0000 mg | ORAL_TABLET | Freq: Two times a day (BID) | ORAL | Status: DC
  Administered 2021-12-11 – 2021-12-16 (×11): 200 mg via ORAL
  Filled 2021-12-11 (×12): qty 1

## 2021-12-11 MED ORDER — ACETAMINOPHEN 650 MG RE SUPP: 650.0000 mg | Freq: Four times a day (QID) | RECTAL | Status: DC | PRN

## 2021-12-11 MED ORDER — ALBUTEROL SULFATE HFA 108 (90 BASE) MCG/ACT IN AERS: 2.0000 | INHALATION_SPRAY | Freq: Four times a day (QID) | RESPIRATORY_TRACT | Status: DC | PRN

## 2021-12-11 MED ORDER — ALPRAZOLAM 0.5 MG PO TABS
0.5000 mg | ORAL_TABLET | Freq: Every day | ORAL | Status: DC
  Administered 2021-12-11 – 2021-12-15 (×5): 0.5 mg via ORAL
  Filled 2021-12-11 (×5): qty 1

## 2021-12-11 MED ORDER — SODIUM CHLORIDE 0.9 % IV SOLN
500.0000 mg | Freq: Once | INTRAVENOUS | Status: AC
  Administered 2021-12-11: 500 mg via INTRAVENOUS
  Filled 2021-12-11: qty 5

## 2021-12-11 MED ORDER — APIXABAN 5 MG PO TABS
5.0000 mg | ORAL_TABLET | Freq: Two times a day (BID) | ORAL | Status: DC
  Administered 2021-12-11: 5 mg via ORAL
  Filled 2021-12-11: qty 1

## 2021-12-11 MED ORDER — ACETAMINOPHEN 500 MG PO TABS
1000.0000 mg | ORAL_TABLET | Freq: Once | ORAL | Status: AC
Start: 2021-12-11 — End: 2021-12-11
  Administered 2021-12-11: 1000 mg via ORAL
  Filled 2021-12-11: qty 2

## 2021-12-11 MED ORDER — FOLIC ACID 1 MG PO TABS
1000.0000 ug | ORAL_TABLET | Freq: Every day | ORAL | Status: DC
  Administered 2021-12-11 – 2021-12-16 (×6): 1 mg via ORAL
  Filled 2021-12-11 (×6): qty 1

## 2021-12-11 MED ORDER — ASCORBIC ACID 500 MG PO TABS
1000.0000 mg | ORAL_TABLET | Freq: Every day | ORAL | Status: DC
  Administered 2021-12-11 – 2021-12-16 (×6): 1000 mg via ORAL
  Filled 2021-12-11 (×6): qty 2

## 2021-12-11 MED ORDER — FUROSEMIDE 10 MG/ML IJ SOLN
20.0000 mg | Freq: Two times a day (BID) | INTRAMUSCULAR | Status: DC
  Administered 2021-12-11: 20 mg via INTRAVENOUS
  Filled 2021-12-11: qty 4

## 2021-12-11 MED ORDER — PRAVASTATIN SODIUM 20 MG PO TABS
20.0000 mg | ORAL_TABLET | Freq: Every day | ORAL | Status: DC
  Administered 2021-12-11 – 2021-12-16 (×5): 20 mg via ORAL
  Filled 2021-12-11 (×5): qty 1

## 2021-12-11 MED ORDER — GABAPENTIN 600 MG PO TABS
600.0000 mg | ORAL_TABLET | Freq: Two times a day (BID) | ORAL | Status: DC
  Administered 2021-12-11 – 2021-12-16 (×9): 600 mg via ORAL
  Filled 2021-12-11 (×9): qty 1

## 2021-12-11 MED ORDER — METOPROLOL SUCCINATE ER 25 MG PO TB24
12.5000 mg | ORAL_TABLET | Freq: Two times a day (BID) | ORAL | Status: DC
  Administered 2021-12-11 – 2021-12-15 (×8): 12.5 mg via ORAL
  Filled 2021-12-11 (×4): qty 1
  Filled 2021-12-11: qty 0.5
  Filled 2021-12-11 (×4): qty 1
  Filled 2021-12-11: qty 0.5
  Filled 2021-12-11 (×2): qty 1

## 2021-12-11 MED ORDER — EPINEPHRINE 0.3 MG/0.3ML IJ SOAJ
0.3000 mg | INTRAMUSCULAR | Status: DC | PRN
  Filled 2021-12-11: qty 0.3

## 2021-12-11 MED ORDER — POLYSACCHARIDE IRON COMPLEX 150 MG PO CAPS
150.0000 mg | ORAL_CAPSULE | Freq: Every day | ORAL | Status: DC
  Administered 2021-12-11 – 2021-12-16 (×6): 150 mg via ORAL
  Filled 2021-12-11 (×7): qty 1

## 2021-12-11 MED ORDER — RISAQUAD PO CAPS
1.0000 | ORAL_CAPSULE | Freq: Every day | ORAL | Status: DC
  Administered 2021-12-11 – 2021-12-16 (×6): 1 via ORAL
  Filled 2021-12-11 (×6): qty 1

## 2021-12-11 MED ORDER — ENOXAPARIN SODIUM 40 MG/0.4ML IJ SOSY: 40.0000 mg | PREFILLED_SYRINGE | INTRAMUSCULAR | Status: DC

## 2021-12-11 MED ORDER — PANTOPRAZOLE SODIUM 40 MG PO TBEC
40.0000 mg | DELAYED_RELEASE_TABLET | Freq: Every day | ORAL | Status: DC
  Administered 2021-12-11 – 2021-12-16 (×6): 40 mg via ORAL
  Filled 2021-12-11 (×6): qty 1

## 2021-12-11 MED ORDER — MAGNESIUM HYDROXIDE 400 MG/5ML PO SUSP: 30.0000 mL | Freq: Every day | ORAL | Status: DC | PRN

## 2021-12-11 MED ORDER — FLUCONAZOLE 100 MG PO TABS
100.0000 mg | ORAL_TABLET | Freq: Every day | ORAL | Status: DC
  Administered 2021-12-11 – 2021-12-16 (×6): 100 mg via ORAL
  Filled 2021-12-11 (×6): qty 1

## 2021-12-11 MED ORDER — ALBUTEROL SULFATE (2.5 MG/3ML) 0.083% IN NEBU: 2.5000 mg | INHALATION_SOLUTION | Freq: Four times a day (QID) | RESPIRATORY_TRACT | Status: DC | PRN

## 2021-12-11 MED ORDER — ADULT MULTIVITAMIN W/MINERALS CH
1.0000 | ORAL_TABLET | Freq: Every day | ORAL | Status: DC
  Administered 2021-12-11 – 2021-12-16 (×6): 1 via ORAL
  Filled 2021-12-11 (×5): qty 1

## 2021-12-11 MED ORDER — POLYETHYLENE GLYCOL 3350 17 G PO PACK
17.0000 g | PACK | Freq: Every day | ORAL | Status: DC
  Administered 2021-12-15: 17 g via ORAL
  Filled 2021-12-11 (×4): qty 1

## 2021-12-11 MED ORDER — MIRABEGRON ER 50 MG PO TB24
50.0000 mg | ORAL_TABLET | Freq: Every day | ORAL | Status: DC
  Administered 2021-12-11 – 2021-12-16 (×6): 50 mg via ORAL
  Filled 2021-12-11 (×6): qty 1

## 2021-12-11 MED ORDER — ONDANSETRON HCL 4 MG/2ML IJ SOLN: 4.0000 mg | Freq: Four times a day (QID) | INTRAMUSCULAR | Status: DC | PRN

## 2021-12-11 MED ORDER — SODIUM CHLORIDE 0.9 % IV BOLUS
250.0000 mL | Freq: Once | INTRAVENOUS | Status: AC
  Administered 2021-12-11: 250 mL via INTRAVENOUS

## 2021-12-11 MED ORDER — SODIUM CHLORIDE 0.9 % IV SOLN
500.0000 mg | INTRAVENOUS | Status: DC
  Administered 2021-12-11: 500 mg via INTRAVENOUS
  Filled 2021-12-11: qty 5
  Filled 2021-12-11: qty 500

## 2021-12-11 MED ORDER — FUROSEMIDE 10 MG/ML IJ SOLN
40.0000 mg | Freq: Two times a day (BID) | INTRAMUSCULAR | Status: DC
  Administered 2021-12-11 – 2021-12-13 (×4): 40 mg via INTRAVENOUS
  Filled 2021-12-11 (×4): qty 4

## 2021-12-11 MED ORDER — GUAIFENESIN ER 600 MG PO TB12
600.0000 mg | ORAL_TABLET | Freq: Two times a day (BID) | ORAL | Status: DC
  Administered 2021-12-11 – 2021-12-15 (×5): 600 mg via ORAL
  Filled 2021-12-11 (×9): qty 1

## 2021-12-11 NOTE — Progress Notes (Signed)
PHARMACIST - PHYSICIAN ORDER COMMUNICATION  CONCERNING: P&T Medication Policy on Herbal Medications  DESCRIPTION:  This patients order for:  Co Q 10 CAPS 100 mg &  Turmeric CAPS 500 mg has been noted.  This product(s) is classified as an herbal or natural product. Due to a lack of definitive safety studies or FDA approval, nonstandard manufacturing practices, plus the potential risk of unknown drug-drug interactions while on inpatient medications, the Pharmacy and Therapeutics Committee does not permit the use of herbal or natural products of this type within [facility].   ACTION TAKEN: The pharmacy department is unable to verify this order at this time. Please reevaluate patients clinical condition at discharge and address if the herbal or natural product(s) should be resumed at that time.   Renda Rolls, PharmD, [facility] 12/11/2021 6:25 AM

## 2021-12-11 NOTE — Plan of Care (Signed)
°  Problem: Education: °Goal: Ability to demonstrate management of disease process will improve °Outcome: Progressing °Goal: Ability to verbalize understanding of medication therapies will improve °Outcome: Progressing °Goal: Individualized Educational Video(s) °Outcome: Progressing °  °Problem: Activity: °Goal: Capacity to carry out activities will improve °Outcome: Progressing °  °Problem: Cardiac: °Goal: Ability to achieve and maintain adequate cardiopulmonary perfusion will improve °Outcome: Progressing °  °Problem: Activity: °Goal: Ability to tolerate increased activity will improve °Outcome: Progressing °  °Problem: Clinical Measurements: °Goal: Ability to maintain a body temperature in the normal range will improve °Outcome: Progressing °  °Problem: Respiratory: °Goal: Ability to maintain adequate ventilation will improve °Outcome: Progressing °Goal: Ability to maintain a clear airway will improve °Outcome: Progressing °  °Problem: Education: °Goal: Knowledge of General Education information will improve °Description: Including pain rating scale, medication(s)/side effects and non-pharmacologic comfort measures °Outcome: Progressing °  °Problem: Health Behavior/Discharge Planning: °Goal: Ability to manage health-related needs will improve °Outcome: Progressing °  °Problem: Clinical Measurements: °Goal: Ability to maintain clinical measurements within normal limits will improve °Outcome: Progressing °Goal: Will remain free from infection °Outcome: Progressing °Goal: Diagnostic test results will improve °Outcome: Progressing °Goal: Respiratory complications will improve °Outcome: Progressing °Goal: Cardiovascular complication will be avoided °Outcome: Progressing °  °Problem: Activity: °Goal: Risk for activity intolerance will decrease °Outcome: Progressing °  °Problem: Nutrition: °Goal: Adequate nutrition will be maintained °Outcome: Progressing °  °Problem: Coping: °Goal: Level of anxiety will  decrease °Outcome: Progressing °  °Problem: Elimination: °Goal: Will not experience complications related to bowel motility °Outcome: Progressing °Goal: Will not experience complications related to urinary retention °Outcome: Progressing °  °Problem: Pain Managment: °Goal: General experience of comfort will improve °Outcome: Progressing °  °Problem: Safety: °Goal: Ability to remain free from injury will improve °Outcome: Progressing °  °Problem: Skin Integrity: °Goal: Risk for impaired skin integrity will decrease °Outcome: Progressing °  °

## 2021-12-11 NOTE — Sepsis Progress Note (Signed)
Following for sepsis monitoring ?

## 2021-12-11 NOTE — ED Provider Notes (Signed)
[facility] Provider Note    Event Date/Time   First MD Initiated Contact with Patient 12/11/21 [phone]     (approximate)   History   Weakness   HPI  [name] is a 88 y.o. male with history of CHF, chronic kidney disease, hypertension, neurogenic bladder with chronic indwelling Foley catheter, rheumatoid arthritis on Plaquenil who presents to the emergency department with his daughter for concerns for generalized weakness over the past couple of days.  They report that whenever this happens to him he normally has a urinary tract infection.  He denies any fevers, vomiting, diarrhea, chest pain, shortness of breath, cough.  Was found to be hypoxic on room air here.  Does not wear oxygen chronically.   History provided by patient and daughter.      Past Medical History:  Diagnosis Date   (HFpEF) heart failure with preserved ejection fraction ([place])    a. 02/2019 Echo: Ef 60-65%, mildly reduced RV fxn. RVSP 76mmHg. Mildly dil LA. Mod dil RA. Mild to mod TR. Mild to mod AS. Triv AI.   Allergy    Anemia    Anxiety    Arthritis    rheumatoid   Asthma    Basal cell carcinoma    back   CAD (coronary artery disease)    CHF (congestive heart failure) (HCC)    Chronic kidney disease    Mass right kidney   Chronic sinusitis    Collagen vascular disease (HCC)    Rheumatoid Arthritis   Depression    Diverticulitis    Dysrhythmia    ED (erectile dysfunction)    GERD (gastroesophageal reflux disease)    History of SIADH    Hx of adenomatous colonic polyps    Hypertension    Hyponatremia    IBS (irritable bowel syndrome)    Neurodermatitis    Neurogenic bladder    OSA (obstructive sleep apnea)    no CPAP since weight loss   Persistent atrial fibrillation ([place])    a. Dx 02/2019; b. CHA2DS2VASc = 6-->eliquis initiated.   Prostate cancer [facility])    Renal mass    10/2018 4.2cm R renal cortical mass. Most compatible w/ clear cell renal cell carcinoma.     Past Surgical History:  Procedure Laterality Date   CARDIOVERSION N/A 04/23/2019   Procedure: CARDIOVERSION;  Surgeon: Nelva Bush, MD;  Location: [facility] ORS;  Service: Cardiovascular;  Laterality: N/A;   KNEE ARTHROPLASTY Right 09/02/2015   Procedure: COMPUTER ASSISTED TOTAL KNEE ARTHROPLASTY;  Surgeon: [name], MD;  Location: [facility] ORS;  Service: Orthopedics;  Laterality: Right;   LAPAROSCOPIC NEPHRECTOMY, HAND ASSISTED Right 06/17/2019   Procedure: HAND ASSISTED LAPAROSCOPIC NEPHRECTOMY;  Surgeon: Hollice Espy, MD;  Location: [facility] ORS;  Service: Urology;  Laterality: Right;   NASAL SINUS SURGERY  2009   DEVIATED SEPTUM AND POLYPS   permanent indwelling catheter     PROSTATE SURGERY     PROSTATECTOMY   TOTAL KNEE ARTHROPLASTY Left 9/15   Dr [name]   URETHRAL STRICTURE DILATATION  02-2010   Dr.Cope    MEDICATIONS:  Prior to Admission medications   Medication Sig Start Date End Date Taking? Authorizing Provider  acetaminophen (TYLENOL) 500 MG tablet Take 500 mg by mouth 3 (three) times daily as needed for mild pain or moderate pain.   Yes [provider]  albuterol (VENTOLIN HFA) 108 (90 Base) MCG/ACT inhaler Inhale 2 puffs into the lungs every 6 (six) hours as needed for wheezing  or shortness of breath. 10/07/21  Yes Val Riles, MD  ALOE PO Take 400 mg by mouth daily.   Yes [provider]  ALPRAZolam (XANAX) 0.5 MG tablet Take 1 tablet (0.5 mg total) by mouth at bedtime. Patient taking differently: Take 0.5 mg by mouth at bedtime as needed for sleep or anxiety. 10/07/21  Yes Val Riles, MD  apixaban (ELIQUIS) 5 MG TABS tablet Take 1 tablet (5 mg total) by mouth 2 (two) times daily. 09/28/21  Yes Max Sane, MD  Ascorbic Acid (VITAMIN C) 1000 MG tablet Take 1,000 mg by mouth daily.   Yes [provider]  cetirizine (ZYRTEC) 10 MG tablet Take 10 mg by mouth at bedtime.   Yes [provider]  chlorhexidine (PERIDEX) 0.12 % solution  Use as directed 15 mLs in the mouth or throat daily as needed.   Yes [provider]  Cholecalciferol (VITAMIN D3) 50 MCG (2000 UT) TABS Take 2,000 Units by mouth daily.    Yes [provider]  Coenzyme Q10 (CO Q 10) 100 MG CAPS Take 100 mg by mouth daily.    Yes [provider]  Cranberry 250 MG CHEW Chew 250 mg by mouth daily.   Yes [provider]  EPINEPHrine 0.3 mg/0.3 mL IJ SOAJ injection Inject 0.3 mg into the muscle as needed for anaphylaxis.   Yes [provider]  fluconazole (DIFLUCAN) 100 MG tablet Take 1 tablet (100 mg total) by mouth daily. 12/03/21  Yes [name], MD  fluticasone (FLONASE) 50 MCG/ACT nasal spray Place 2 sprays into both nostrils 2 (two) times daily. 04/27/17  Yes [name], MD  folic acid (FOLVITE) 376 MCG tablet Take 1,200 mcg by mouth daily.   Yes [provider]  gabapentin (NEURONTIN) 600 MG tablet Take 600 mg by mouth 2 (two) times daily at 8 am and 10 pm.   Yes [provider]  hydroxychloroquine (PLAQUENIL) 200 MG tablet Take 200 mg by mouth 2 (two) times daily.  01/22/16  Yes [provider]  iron polysaccharides (NIFEREX) 150 MG capsule Take 150 mg by mouth daily.   Yes [provider]  ketoconazole (NIZORAL) 2 % cream Apply 1 application topically 2 (two) times daily. 02/12/21  Yes Vaillancourt, Samantha, PA-C  ketoconazole (NIZORAL) 2 % shampoo Apply 1 application topically 2 (two) times a week. 06/15/20  Yes [provider]  methotrexate (RHEUMATREX) 2.5 MG tablet Take 25 mg by mouth every Wednesday.   Yes [provider]  metoprolol succinate (TOPROL-XL) 25 MG 24 hr tablet Take 0.5 tablets (12.5 mg total) by mouth 2 (two) times daily. 10/19/21  Yes Isla Pence, MD  milk thistle 175 MG tablet Take 175 mg by mouth daily.   Yes [provider]  MYRBETRIQ 50 MG TB24 tablet TAKE 1 TABLET(50 MG) BY MOUTH DAILY 09/09/21  Yes Hollice Espy, MD   nystatin cream (MYCOSTATIN) Apply 1 application topically 2 (two) times daily. 12/02/21  Yes McGowan, Shannon A, PA-C  Omega 3 1200 MG CAPS Take 1 capsule by mouth daily.   Yes [provider]  omeprazole (PRILOSEC) 20 MG capsule Take 1 capsule (20 mg total) by mouth daily as needed. 02/09/21  Yes [name], MD  ondansetron (ZOFRAN) 4 MG tablet Take 4 mg by mouth every 6 (six) hours as needed for nausea or vomiting.   Yes [provider]  polyethylene glycol powder (GLYCOLAX/MIRALAX) 17 GM/SCOOP powder Take 17 g by mouth at bedtime.  Yes [provider]  pravastatin (PRAVACHOL) 20 MG tablet TAKE 1 TABLET(20 MG) BY MOUTH AT BEDTIME 01/18/21  Yes [name], MD  Probiotic Product (ALIGN) 4 MG CAPS Take 1 capsule (4 mg total) by mouth daily. 12/15/16  Yes Gouru, Illene Silver, MD  Selenium 200 MCG CAPS Take 200 mcg by mouth daily.   Yes [provider]  traZODone (DESYREL) 50 MG tablet Take 50 mg by mouth at bedtime. 08/12/21  Yes [provider]  triamcinolone ointment (KENALOG) 0.1 % Apply 1 application topically as needed (skin irritation).   Yes [provider]  Turmeric 500 MG CAPS Take 500 mg by mouth daily.    Yes [provider]  vitamin B-12 (CYANOCOBALAMIN) 1000 MCG tablet Take 1,000 mcg by mouth daily.   Yes [provider]  Zinc 50 MG TABS Take 1 tablet by mouth daily.   Yes [provider]    Physical Exam   Triage Vital Signs: ED Triage Vitals  Enc Vitals Group     BP 12/10/21 1643 (!) 105/41     Pulse Rate 12/10/21 1643 74     Resp 12/10/21 1643 18     Temp 12/10/21 1643 98.7 F (37.1 C)     Temp Source 12/10/21 1643 Oral     SpO2 12/10/21 1643 97 %     Weight 12/10/21 1641 200 lb (90.7 kg)     Height 12/10/21 1641 5\' 9"  (1.753 m)     Head Circumference --      Peak Flow --      Pain Score 12/10/21 1640 0     Pain Loc --      Pain Edu? --      Excl. in [place]? --     Most recent vital  signs: Vitals:   12/11/21 0630 12/11/21 0700  BP: (!) 119/44 (!) 118/38  Pulse: 85 86  Resp: (!) 25 (!) 26  Temp:    SpO2: 97% 92%    CONSTITUTIONAL: Alert and oriented and responds appropriately to questions. Well-appearing; well-nourished, elderly HEAD: Normocephalic, atraumatic EYES: Conjunctivae clear, pupils appear equal, sclera nonicteric ENT: normal nose; moist mucous membranes NECK: Supple, normal ROM CARD: RRR; S1 and S2 appreciated; no murmurs, no clicks, no rubs, no gallops RESP: Normal chest excursion without splinting or tachypnea; breath sounds clear and equal bilaterally; no wheezes, no rhonchi, no rales, patient is hypoxic on room air at rest, speaking full sentences, no distress ABD/GI: Normal bowel sounds; non-distended; soft, non-tender, no rebound, no guarding, no peritoneal signs; patient does have some superficial breakdown of the skin to the glans of his penis and some signs of stage I-II sacral decubitus ulcers to his buttocks without superimposed infection, bleeding or drainage; he has no gross blood or melena on rectal exam BACK: The back appears normal EXT: Normal ROM in all joints; no deformity noted, no edema; no cyanosis SKIN: Normal color for age and race; warm; no rash on exposed skin NEURO: Moves all extremities equally, normal speech PSYCH: The patient's mood and manner are appropriate.   ED Results / Procedures / Treatments   LABS: (all labs ordered are listed, but only abnormal results are displayed) Labs Reviewed  CBC WITH DIFFERENTIAL/PLATELET - Abnormal; Notable for the following components:      Result Value   RBC 2.82 (*)    Hemoglobin 9.2 (*)    HCT 28.8 (*)    MCV 102.1 (*)    RDW 15.9 (*)  Abs Immature Granulocytes 0.09 (*)    All other components within normal limits  COMPREHENSIVE METABOLIC PANEL - Abnormal; Notable for the following components:   CO2 33 (*)    BUN 28 (*)    Creatinine, Ser 1.41 (*)    Calcium 8.4 (*)     Total Protein 6.2 (*)    Albumin 3.2 (*)    GFR, Estimated 49 (*)    All other components within normal limits  URINALYSIS, COMPLETE (UACMP) WITH MICROSCOPIC - Abnormal; Notable for the following components:   APPearance CLOUDY (*)    Hgb urine dipstick TRACE (*)    Protein, ur 30 (*)    Nitrite POSITIVE (*)    Leukocytes,Ua SMALL (*)    Bacteria, UA FEW (*)    All other components within normal limits  BRAIN NATRIURETIC PEPTIDE - Abnormal; Notable for the following components:   B Natriuretic Peptide 585.0 (*)    All other components within normal limits  BASIC METABOLIC PANEL - Abnormal; Notable for the following components:   Glucose, Bld 103 (*)    BUN 24 (*)    Calcium 7.7 (*)    All other components within normal limits  CBC - Abnormal; Notable for the following components:   RBC 2.28 (*)    Hemoglobin 7.4 (*)    HCT 23.0 (*)    MCV 100.9 (*)    RDW 15.9 (*)    All other components within normal limits  TROPONIN I (HIGH SENSITIVITY) - Abnormal; Notable for the following components:   Troponin I (High Sensitivity) 32 (*)    All other components within normal limits  TROPONIN I (HIGH SENSITIVITY) - Abnormal; Notable for the following components:   Troponin I (High Sensitivity) 22 (*)    All other components within normal limits  RESP PANEL BY RT-PCR (FLU A&B, COVID) ARPGX2  CULTURE, BLOOD (ROUTINE X 2)  CULTURE, BLOOD (ROUTINE X 2)  URINE CULTURE  PROCALCITONIN  LACTIC ACID, PLASMA     EKG:  EKG Interpretation  Date/Time:  Friday December 10 2021 17:01:34 EST Ventricular Rate:  75 PR Interval:  206 QRS Duration: 160 QT Interval:  498 QTC Calculation: 556 R Axis:   -79 Text Interpretation: Normal sinus rhythm Right bundle branch block Left anterior fascicular block  Bifascicular block  T wave abnormality, consider inferolateral ischemia Abnormal ECG When compared with ECG of 12-Nov-2021 04:46, T wave inversion now evident in Inferior leads T wave inversion less  evident in Anterior leads T wave inversion now evident in Lateral leads QT has lengthened Confirmed by Pryor Curia [phone]) on 12/11/2021 7:51:06 AM         RADIOLOGY: My personal review and interpretation of imaging: Chest x-ray shows developing left-sided pneumonia.  CT head shows no acute abnormality.  I have personally reviewed all radiology reports.   DG Chest 1 View  Result Date: 12/10/2021 CLINICAL DATA:  weakness EXAM: CHEST  1 VIEW COMPARISON:  Chest x-ray 11/16/2021, CT chest 05/30/2018 FINDINGS: The heart and mediastinal contours are unchanged. Aortic calcification. Prominent hilar vasculature. Azygous fissure. Left basilar streaky airspace opacities. Question developing airspace opacity within the lingula. No focal consolidation. Increased interstitial markings. No pleural effusion. No pneumothorax. No acute osseous abnormality. IMPRESSION: 1. Pulmonary edema. 2. Question developing airspace opacity within the lingula may represent infection/inflammation. 3. Left basilar streaky airspace opacity may represent atelectasis versus infection/inflammation. 4.  Aortic Atherosclerosis (ICD10-I70.0). Electronically Signed   By: Iven Finn M.D.   On: 12/10/2021 17:21  CT Head Wo Contrast  Result Date: 12/10/2021 CLINICAL DATA:  Mental status change, unknown cause, generalized weakness EXAM: CT HEAD WITHOUT CONTRAST TECHNIQUE: Contiguous axial images were obtained from the base of the skull through the vertex without intravenous contrast. RADIATION DOSE REDUCTION: This exam was performed according to the departmental dose-optimization program which includes automated exposure control, adjustment of the mA and/or kV according to patient size and/or use of iterative reconstruction technique. COMPARISON:  None. BRAIN: BRAIN Cerebral ventricle sizes are concordant with the degree of cerebral volume loss. Patchy and confluent areas of decreased attenuation are noted throughout the deep and  periventricular white matter of the cerebral hemispheres bilaterally, compatible with chronic microvascular ischemic disease. No evidence of large-territorial acute infarction. No parenchymal hemorrhage. No mass lesion. No extra-axial collection. No mass effect or midline shift. No hydrocephalus. Basilar cisterns are patent. Vascular: No hyperdense vessel. Atherosclerotic calcifications are present within the cavernous internal carotid arteries. Skull: No acute fracture or focal lesion. Sinuses/Orbits: Bilateral maxillary and sphenoid mucosal thickening. Mild mucosal thickening of the ethmoid sinuses. Remaining visualized paranasal sinuses and mastoid air cells are clear. The orbits are unremarkable. Other: None. IMPRESSION: No acute intracranial abnormality. Electronically Signed   By: Iven Finn M.D.   On: 12/10/2021 18:33     PROCEDURES:  Critical Care performed: Yes, see critical care procedure note(s)   CRITICAL CARE Performed by: Cyril Mourning Novia Lansberry   Total critical care time: 45 minutes  Critical care time was exclusive of separately billable procedures and treating other patients.  Critical care was necessary to treat or prevent imminent or life-threatening deterioration.  Critical care was time spent personally by me on the following activities: development of treatment plan with patient and/or surrogate as well as nursing, discussions with consultants, evaluation of patient's response to treatment, examination of patient, obtaining history from patient or surrogate, ordering and performing treatments and interventions, ordering and review of laboratory studies, ordering and review of radiographic studies, pulse oximetry and re-evaluation of patient's condition.   Marland Kitchen1-3 Lead EKG Interpretation Performed by: Jamina Macbeth, Delice Bison, DO Authorized by: Dellie Piasecki, Delice Bison, DO     Interpretation: normal     ECG rate:  88   ECG rate assessment: normal     Rhythm: sinus rhythm     Ectopy: none      Conduction: normal      IMPRESSION / MDM / ASSESSMENT AND PLAN / ED COURSE  I reviewed the triage vital signs and the nursing notes.    Patient here with increasing weakness with concerns for possible UTI.  Also now found to have a new oxygen requirement.  Denies chest pain or shortness of breath.  The patient is on the cardiac monitor to evaluate for evidence of arrhythmia and/or significant heart rate changes.   DIFFERENTIAL DIAGNOSIS (includes but not limited to):   UTI, intracranial hemorrhage, CVA, dehydration, anemia, electrolyte derangement, thyroid dysfunction, ACS, PE, pneumonia, pneumothorax, recurrent bacteremia, asthma exacerbation   PLAN: Labs, urine, CT head, chest x-ray, EKG.  Patient will need admission given new oxygen requirement.   MEDICATIONS GIVEN IN ED: Medications  methotrexate (RHEUMATREX) tablet 25 mg (has no administration in time range)  hydroxychloroquine (PLAQUENIL) tablet 200 mg (has no administration in time range)  fluconazole (DIFLUCAN) tablet 100 mg (has no administration in time range)  EPINEPHrine (EPI-PEN) injection 0.3 mg (has no administration in time range)  metoprolol succinate (TOPROL-XL) 24 hr tablet 12.5 mg (has no administration in time range)  pravastatin (PRAVACHOL) tablet 20  mg (has no administration in time range)  ALPRAZolam (XANAX) tablet 0.5 mg (has no administration in time range)  traZODone (DESYREL) tablet 50 mg (has no administration in time range)  pantoprazole (PROTONIX) EC tablet 40 mg (has no administration in time range)  polyethylene glycol (MIRALAX / GLYCOLAX) packet 17 g (has no administration in time range)  acidophilus (RISAQUAD) capsule 1 capsule (has no administration in time range)  mirabegron ER (MYRBETRIQ) tablet 50 mg (has no administration in time range)  apixaban (ELIQUIS) tablet 5 mg (has no administration in time range)  folic acid (FOLVITE) tablet 1 mg (has no administration in time range)  gabapentin  (NEURONTIN) tablet 600 mg (has no administration in time range)  ascorbic acid (VITAMIN C) tablet 1,000 mg (has no administration in time range)  multivitamin with minerals tablet 1 tablet (has no administration in time range)  loratadine (CLARITIN) tablet 10 mg (has no administration in time range)  fluticasone (FLONASE) 50 MCG/ACT nasal spray 2 spray (has no administration in time range)  acetaminophen (TYLENOL) tablet 650 mg (has no administration in time range)    Or  acetaminophen (TYLENOL) suppository 650 mg (has no administration in time range)  magnesium hydroxide (MILK OF MAGNESIA) suspension 30 mL (has no administration in time range)  ondansetron (ZOFRAN) tablet 4 mg (has no administration in time range)    Or  ondansetron (ZOFRAN) injection 4 mg (has no administration in time range)  traZODone (DESYREL) tablet 25 mg (has no administration in time range)  furosemide (LASIX) injection 20 mg (has no administration in time range)  cefTRIAXone (ROCEPHIN) 2 g in sodium chloride 0.9 % 100 mL IVPB (has no administration in time range)  azithromycin (ZITHROMAX) 500 mg in sodium chloride 0.9 % 250 mL IVPB (has no administration in time range)  guaiFENesin (MUCINEX) 12 hr tablet 600 mg (has no administration in time range)  albuterol (PROVENTIL) (2.5 MG/3ML) 0.083% nebulizer solution 2.5 mg (has no administration in time range)  cefTRIAXone (ROCEPHIN) 2 g in sodium chloride 0.9 % 100 mL IVPB (0 g Intravenous Stopped 12/11/21 0343)  azithromycin (ZITHROMAX) 500 mg in sodium chloride 0.9 % 250 mL IVPB (0 mg Intravenous Stopped 12/11/21 0448)  acetaminophen (TYLENOL) tablet 1,000 mg (1,000 mg Oral Given 12/11/21 0313)  sodium chloride 0.9 % bolus 250 mL (0 mLs Intravenous Stopped 12/11/21 0630)     ED COURSE: Patient's labs show anemia which appears stable compared to recent.  He does have slightly increasing renal function over the past couple of months.  Troponin minimally elevated but stable.   BNP elevated greater than 500 chest x-ray more concerning for pneumonia rather than CHF.  CT of the head shows no acute abnormality.  Urine does appear infected.  We will give Rocephin and azithromycin for coverage for community-acquired pneumonia and his UTI.  Cultures are pending.  Lactic normal.  Procalcitonin only minimally elevated.  He has been hemodynamically stable and doing well on 2 L nasal cannula.  Will admit for pneumonia, UTI, weakness and new oxygen requirement.   CONSULTS: Consulted and discussed patient's case with hospitalist, Dr. Sidney Ace.  I have recommended admission and consulting physician agrees and will place admission orders.  Patient (and family if present) agree with this plan.   I reviewed all nursing notes, vitals, pertinent previous records.  All labs, EKGs, imaging ordered have been independently reviewed and interpreted by myself.    OUTSIDE RECORDS REVIEWED: I have reviewed patient's last admission from 10/05/2021 to 10/08/2021.  At  that time he was admitted for asthma exacerbation with acute respiratory failure and hypoxia.  Prior to that patient was hospitalized on 09/11/2021 to 09/28/2021 for sepsis, E. coli bacteremia.         FINAL CLINICAL IMPRESSION(S) / ED DIAGNOSES   Final diagnoses:  Generalized weakness  Acute UTI  Community acquired pneumonia of left lung, unspecified part of lung  Acute respiratory failure with hypoxia ([place])     Rx / DC Orders   ED Discharge Orders     None        Note:  This document was prepared using Dragon voice recognition software and may include unintentional dictation errors.   Osinachi Navarrette, Delice Bison, DO 12/11/21 [phone]

## 2021-12-11 NOTE — Progress Notes (Signed)
CODE SEPSIS - PHARMACY COMMUNICATION  **Broad Spectrum Antibiotics should be administered within 1 hour of Sepsis diagnosis**  Time Code Sepsis Called/Page Received: 8441  Antibiotics Ordered: Azithromycin & Ceftriaxone  Time of 1st antibiotic administration: 7127  Renda Rolls, PharmD, [facility] 12/11/2021 2:43 AM

## 2021-12-11 NOTE — Consult Note (Signed)
Cardiology Consultation:   Patient ID: [name] MRN: [id number]; DOB: December 29, 1933  Admit date: 12/11/2021 Date of Consult: 12/11/2021  PCP:  [name], MD   [facility] Providers Cardiologist:  Kathlyn Sacramento, MD        Patient Profile:   [name] is a 88 y.o. male with a hx of HFpEF, atrial fibrillation who is being seen 12/11/2021 for the evaluation of CHF at the request of Dr. Roosevelt Locks.  History of Present Illness:   [name] is an 88 year old male with history of HFpEF, atrial fibrillation on Eliquis, mild aortic stenosis, hyperlipidemia, prostate cancer s/p prostatectomy, chronic indwelling Foley catheter who presents due to weakness and fatigue.  Patient has chronic decline over the past several weeks.  He was admitted 2 months ago with UTI.  Discharged to short-term rehab and subsequently to home with physical therapy.  He states doing relatively well about a month ago.  Over the past 2 weeks, he has noticed worsening weakness unable to stand, fatigue.  His home physical therapy is advised him to come to the emergency room.  He denies any bleeding in his urine or stool.  Denies chest pain, but endorses lower extremity edema which is worsened over the past week.  In the ED, he was diagnosed with a UTI, chest x-ray showed pulmonary edema, possible pneumonia.  He was started on IV Lasix and antibiotics.  Hemoglobin this morning was 7.4, 9.2 yesterday.  EKG shows sinus rhythm, bifascicular block.   Past Medical History:  Diagnosis Date   (HFpEF) heart failure with preserved ejection fraction ([place])    a. 02/2019 Echo: Ef 60-65%, mildly reduced RV fxn. RVSP 51mmHg. Mildly dil LA. Mod dil RA. Mild to mod TR. Mild to mod AS. Triv AI.   Allergy    Anemia    Anxiety    Arthritis    rheumatoid   Asthma    Basal cell carcinoma    back   CAD (coronary artery disease)    CHF (congestive heart failure) (HCC)    Chronic kidney disease    Mass right kidney   Chronic  sinusitis    Collagen vascular disease (HCC)    Rheumatoid Arthritis   Depression    Diverticulitis    Dysrhythmia    ED (erectile dysfunction)    GERD (gastroesophageal reflux disease)    History of SIADH    Hx of adenomatous colonic polyps    Hypertension    Hyponatremia    IBS (irritable bowel syndrome)    Neurodermatitis    Neurogenic bladder    OSA (obstructive sleep apnea)    no CPAP since weight loss   Persistent atrial fibrillation ([place])    a. Dx 02/2019; b. CHA2DS2VASc = 6-->eliquis initiated.   Prostate cancer [facility])    Renal mass    10/2018 4.2cm R renal cortical mass. Most compatible w/ clear cell renal cell carcinoma.    Past Surgical History:  Procedure Laterality Date   CARDIOVERSION N/A 04/23/2019   Procedure: CARDIOVERSION;  Surgeon: Nelva Bush, MD;  Location: [facility] ORS;  Service: Cardiovascular;  Laterality: N/A;   KNEE ARTHROPLASTY Right 09/02/2015   Procedure: COMPUTER ASSISTED TOTAL KNEE ARTHROPLASTY;  Surgeon: [name], MD;  Location: [facility] ORS;  Service: Orthopedics;  Laterality: Right;   LAPAROSCOPIC NEPHRECTOMY, HAND ASSISTED Right 06/17/2019   Procedure: HAND ASSISTED LAPAROSCOPIC NEPHRECTOMY;  Surgeon: Hollice Espy, MD;  Location: [facility] ORS;  Service: Urology;  Laterality: Right;   NASAL SINUS SURGERY  2009   DEVIATED SEPTUM AND POLYPS   permanent indwelling catheter     PROSTATE SURGERY     PROSTATECTOMY   TOTAL KNEE ARTHROPLASTY Left 9/15   Dr [name]   URETHRAL STRICTURE DILATATION  02-2010   Dr.Cope     Home Medications:  Prior to Admission medications   Medication Sig Start Date End Date Taking? Authorizing Provider  acetaminophen (TYLENOL) 500 MG tablet Take 500 mg by mouth 3 (three) times daily as needed for mild pain or moderate pain.   Yes [provider]  albuterol (VENTOLIN HFA) 108 (90 Base) MCG/ACT inhaler Inhale 2 puffs into the lungs every 6 (six) hours as needed for wheezing or shortness of breath. 10/07/21  Yes  Val Riles, MD  ALOE PO Take 400 mg by mouth daily.   Yes [provider]  ALPRAZolam (XANAX) 0.5 MG tablet Take 1 tablet (0.5 mg total) by mouth at bedtime. Patient taking differently: Take 0.5 mg by mouth at bedtime as needed for sleep or anxiety. 10/07/21  Yes Val Riles, MD  apixaban (ELIQUIS) 5 MG TABS tablet Take 1 tablet (5 mg total) by mouth 2 (two) times daily. 09/28/21  Yes Max Sane, MD  Ascorbic Acid (VITAMIN C) 1000 MG tablet Take 1,000 mg by mouth daily.   Yes [provider]  cetirizine (ZYRTEC) 10 MG tablet Take 10 mg by mouth at bedtime.   Yes [provider]  chlorhexidine (PERIDEX) 0.12 % solution Use as directed 15 mLs in the mouth or throat daily as needed.   Yes [provider]  Cholecalciferol (VITAMIN D3) 50 MCG (2000 UT) TABS Take 2,000 Units by mouth daily.    Yes [provider]  Coenzyme Q10 (CO Q 10) 100 MG CAPS Take 100 mg by mouth daily.    Yes [provider]  Cranberry 250 MG CHEW Chew 250 mg by mouth daily.   Yes [provider]  EPINEPHrine 0.3 mg/0.3 mL IJ SOAJ injection Inject 0.3 mg into the muscle as needed for anaphylaxis.   Yes [provider]  fluconazole (DIFLUCAN) 100 MG tablet Take 1 tablet (100 mg total) by mouth daily. 12/03/21  Yes [name], MD  fluticasone (FLONASE) 50 MCG/ACT nasal spray Place 2 sprays into both nostrils 2 (two) times daily. 04/27/17  Yes [name], MD  folic acid (FOLVITE) 427 MCG tablet Take 1,200 mcg by mouth daily.   Yes [provider]  gabapentin (NEURONTIN) 600 MG tablet Take 600 mg by mouth 2 (two) times daily at 8 am and 10 pm.   Yes [provider]  hydroxychloroquine (PLAQUENIL) 200 MG tablet Take 200 mg by mouth 2 (two) times daily.  01/22/16  Yes [provider]  iron polysaccharides (NIFEREX) 150 MG capsule Take 150 mg by mouth daily.   Yes [provider]  ketoconazole (NIZORAL) 2 % cream  Apply 1 application topically 2 (two) times daily. 02/12/21  Yes Vaillancourt, Samantha, PA-C  ketoconazole (NIZORAL) 2 % shampoo Apply 1 application topically 2 (two) times a week. 06/15/20  Yes [provider]  methotrexate (RHEUMATREX) 2.5 MG tablet Take 25 mg by mouth every Wednesday.   Yes [provider]  metoprolol succinate (TOPROL-XL) 25 MG 24 hr tablet Take 0.5 tablets (12.5 mg total) by mouth 2 (two) times daily. 10/19/21  Yes Isla Pence, MD  milk thistle 175 MG tablet Take 175 mg by mouth daily.   Yes [provider]  MYRBETRIQ 50 MG [phone]  tablet TAKE 1 TABLET(50 MG) BY MOUTH DAILY 09/09/21  Yes Hollice Espy, MD  nystatin cream (MYCOSTATIN) Apply 1 application topically 2 (two) times daily. 12/02/21  Yes McGowan, Shannon A, PA-C  Omega 3 1200 MG CAPS Take 1 capsule by mouth daily.   Yes [provider]  omeprazole (PRILOSEC) 20 MG capsule Take 1 capsule (20 mg total) by mouth daily as needed. 02/09/21  Yes [name], MD  ondansetron (ZOFRAN) 4 MG tablet Take 4 mg by mouth every 6 (six) hours as needed for nausea or vomiting.   Yes [provider]  polyethylene glycol powder (GLYCOLAX/MIRALAX) 17 GM/SCOOP powder Take 17 g by mouth at bedtime.   Yes [provider]  pravastatin (PRAVACHOL) 20 MG tablet TAKE 1 TABLET(20 MG) BY MOUTH AT BEDTIME 01/18/21  Yes [name], MD  Probiotic Product (ALIGN) 4 MG CAPS Take 1 capsule (4 mg total) by mouth daily. 12/15/16  Yes Gouru, Illene Silver, MD  Selenium 200 MCG CAPS Take 200 mcg by mouth daily.   Yes [provider]  traZODone (DESYREL) 50 MG tablet Take 50 mg by mouth at bedtime. 08/12/21  Yes [provider]  triamcinolone ointment (KENALOG) 0.1 % Apply 1 application topically as needed (skin irritation).   Yes [provider]  Turmeric 500 MG CAPS Take 500 mg by mouth daily.    Yes [provider]  vitamin B-12 (CYANOCOBALAMIN) 1000 MCG tablet  Take 1,000 mcg by mouth daily.   Yes [provider]  Zinc 50 MG TABS Take 1 tablet by mouth daily.   Yes [provider]    Inpatient Medications: Scheduled Meds:  acidophilus  1 capsule Oral Daily   ALPRAZolam  0.5 mg Oral QHS   apixaban  5 mg Oral BID   vitamin C  1,000 mg Oral Daily   fluconazole  100 mg Oral Daily   fluticasone  2 spray Each Nare BID   folic acid  8,676 mcg Oral Daily   furosemide  40 mg Intravenous Q12H   gabapentin  600 mg Oral BID AC & HS   guaiFENesin  600 mg Oral BID   hydroxychloroquine  200 mg Oral BID   iron polysaccharides  150 mg Oral Daily   loratadine  10 mg Oral Daily   [START ON 12/15/2021] methotrexate  25 mg Oral Weekly   metoprolol succinate  12.5 mg Oral BID   mirabegron ER  50 mg Oral Daily   multivitamin with minerals  1 tablet Oral Daily   pantoprazole  40 mg Oral Daily   polyethylene glycol  17 g Oral QHS   pravastatin  20 mg Oral q1800   traZODone  50 mg Oral QHS   Continuous Infusions:  azithromycin     cefTRIAXone (ROCEPHIN)  IV     PRN Meds: acetaminophen **OR** acetaminophen, albuterol, EPINEPHrine, magnesium hydroxide, ondansetron **OR** ondansetron (ZOFRAN) IV, traZODone  Allergies:    Allergies  Allergen Reactions   Chlorhexidine    Ciprofloxacin Nausea And Vomiting    Headache   Citalopram Hydrobromide Other (See Comments)    "unknown"   Clindamycin Nausea And Vomiting   Lorazepam Other (See Comments)    Adverse reaction   Paroxetine Nausea Only   Ramipril Other (See Comments)    "unknown"   Simvastatin    Sulfa Antibiotics Other (See Comments)    Social History:   Social History   Socioeconomic History   Marital status: Married    Spouse name: Not  on file   Number of children: 2   Years of education: Not on file   Highest education level: Not on file  Occupational History   Occupation: Radio Licensed conveyancer    Comment: retired  Tobacco Use   Smoking status: Never   Smokeless  tobacco: Never  Vaping Use   Vaping Use: Never used  Substance and Sexual Activity   Alcohol use: Yes    Comment: 5 days out of 7 drinks scotch   Drug use: No   Sexual activity: Not Currently  Other Topics Concern   Not on file  Social History Narrative   Not sure about a living will or health care POA   Wife should make health care decisions for him--then daughter, then son   Would accept resuscitation attempts   Not sure about tube feeds   Social Determinants of Health   Financial Resource Strain: Not on file  Food Insecurity: Not on file  Transportation Needs: Not on file  Physical Activity: Not on file  Stress: Not on file  Social Connections: Not on file  Intimate Partner Violence: Not on file    Family History:    Family History  Problem Relation Age of Onset   Heart disease Mother 39       heart failure   Heart failure Mother    Pneumonia Father 74       pnemonia   Skin cancer Father    Skin cancer Sister    Skin cancer Son    Colon cancer Neg Hx    Esophageal cancer Neg Hx    Stomach cancer Neg Hx    Pancreatic cancer Neg Hx    Liver disease Neg Hx      ROS:  Please see the history of present illness.   All other ROS reviewed and negative.     Physical Exam/Data:   Vitals:   12/11/21 1000 12/11/21 1030 12/11/21 1300 12/11/21 1400  BP: (!) 102/52 104/77 (!) 116/59 (!) 114/47  Pulse: 80 79 88 84  Resp: 20 (!) 25 (!) 26 20  Temp:      TempSrc:      SpO2: 99% 100% 100% 100%  Weight:      Height:        Intake/Output Summary (Last 24 hours) at 12/11/2021 1421 Last data filed at 12/11/2021 1358 Gross per 24 hour  Intake --  Output 1000 ml  Net -1000 ml   Last 3 Weights 12/10/2021 12/07/2021 11/16/2021  Weight (lbs) 200 lb (No Data) 200 lb  Weight (kg) 90.719 kg (No Data) 90.719 kg  Some encounter information is confidential and restricted. Go to Review Flowsheets activity to see all data.     Body mass index is 29.53 kg/m.  General:  Well  nourished, soft-spoken, appears ill HEENT: Hard of hearing Neck: no JVD Vascular: No carotid bruits; Distal pulses 2+ bilaterally Cardiac:  normal S1, S2; RRR; 2/6 systolic murmur  Lungs: Decreased breath sounds at bases, Abd: soft, nontender, no hepatomegaly  Ext: 1-2+ edema Musculoskeletal:  No deformities, lower extremity weakness noted Psych:  Normal affect   EKG:  The EKG was personally reviewed and demonstrates: Sinus rhythm, bifascicular block Telemetry:  Telemetry was personally reviewed and demonstrates: Sinus rhythm  Relevant CV Studies: TTE 04/2020 1. Left ventricular ejection fraction, by estimation, is 60 to 65%. The  left ventricle has normal function. The left ventricle has no regional  wall motion abnormalities. There is moderate left ventricular hypertrophy.  Left ventricular diastolic  parameters are consistent with Grade II diastolic dysfunction  (pseudonormalization).   2. Right ventricular systolic function is normal. The right ventricular  size is mildly enlarged. There is mildly elevated pulmonary artery  systolic pressure.   3. Left atrial size was mildly dilated.   4. Right atrial size was mildly dilated.   5. The mitral valve is degenerative. Trivial mitral valve regurgitation.  No evidence of mitral stenosis.   6. The aortic valve is tricuspid. Aortic valve regurgitation is not  visualized. Mild aortic valve stenosis.   7. Aortic dilatation noted. There is mild dilatation of the aortic root  measuring 42 mm.   8. The inferior vena cava is dilated in size with <50% respiratory  variability, suggesting right atrial pressure of 15 mmHg.   Laboratory Data:  High Sensitivity Troponin:   Recent Labs  Lab 12/10/21 1639 12/11/21 0300  TROPONINIHS 32* 22*     Chemistry Recent Labs  Lab 12/10/21 1639 12/11/21 0547  NA 141 136  K 4.0 3.6  CL 99 98  CO2 33* 31  GLUCOSE 95 103*  BUN 28* 24*  CREATININE 1.41* 1.16  CALCIUM 8.4* 7.7*  GFRNONAA 49*  >60  ANIONGAP 9 7    Recent Labs  Lab 12/10/21 1639  PROT 6.2*  ALBUMIN 3.2*  AST 19  ALT 11  ALKPHOS 56  BILITOT 0.6   Lipids No results for input(s): CHOL, TRIG, HDL, LABVLDL, LDLCALC, CHOLHDL in the last 168 hours.  Hematology Recent Labs  Lab 12/10/21 1639 12/11/21 0547  WBC 6.2 4.7  RBC 2.82* 2.28*  HGB 9.2* 7.4*  HCT 28.8* 23.0*  MCV 102.1* 100.9*  MCH 32.6 32.5  MCHC 31.9 32.2  RDW 15.9* 15.9*  PLT 209 194   Thyroid No results for input(s): TSH, FREET4 in the last 168 hours.  BNP Recent Labs  Lab 12/10/21 1647  BNP 585.0*    DDimer No results for input(s): DDIMER in the last 168 hours.   Radiology/Studies:  DG Chest 1 View  Result Date: 12/10/2021 CLINICAL DATA:  weakness EXAM: CHEST  1 VIEW COMPARISON:  Chest x-ray 11/16/2021, CT chest 05/30/2018 FINDINGS: The heart and mediastinal contours are unchanged. Aortic calcification. Prominent hilar vasculature. Azygous fissure. Left basilar streaky airspace opacities. Question developing airspace opacity within the lingula. No focal consolidation. Increased interstitial markings. No pleural effusion. No pneumothorax. No acute osseous abnormality. IMPRESSION: 1. Pulmonary edema. 2. Question developing airspace opacity within the lingula may represent infection/inflammation. 3. Left basilar streaky airspace opacity may represent atelectasis versus infection/inflammation. 4.  Aortic Atherosclerosis (ICD10-I70.0). Electronically Signed   By: Iven Finn M.D.   On: 12/10/2021 17:21   CT Head Wo Contrast  Result Date: 12/10/2021 CLINICAL DATA:  Mental status change, unknown cause, generalized weakness EXAM: CT HEAD WITHOUT CONTRAST TECHNIQUE: Contiguous axial images were obtained from the base of the skull through the vertex without intravenous contrast. RADIATION DOSE REDUCTION: This exam was performed according to the departmental dose-optimization program which includes automated exposure control, adjustment of the mA  and/or kV according to patient size and/or use of iterative reconstruction technique. COMPARISON:  None. BRAIN: BRAIN Cerebral ventricle sizes are concordant with the degree of cerebral volume loss. Patchy and confluent areas of decreased attenuation are noted throughout the deep and periventricular white matter of the cerebral hemispheres bilaterally, compatible with chronic microvascular ischemic disease. No evidence of large-territorial acute infarction. No parenchymal hemorrhage. No mass lesion. No extra-axial collection. No mass effect or midline  shift. No hydrocephalus. Basilar cisterns are patent. Vascular: No hyperdense vessel. Atherosclerotic calcifications are present within the cavernous internal carotid arteries. Skull: No acute fracture or focal lesion. Sinuses/Orbits: Bilateral maxillary and sphenoid mucosal thickening. Mild mucosal thickening of the ethmoid sinuses. Remaining visualized paranasal sinuses and mastoid air cells are clear. The orbits are unremarkable. Other: None. IMPRESSION: No acute intracranial abnormality. Electronically Signed   By: Iven Finn M.D.   On: 12/10/2021 18:33     Assessment and Plan:   HFpEF, pulmonary edema -Continue Lasix IV 40 mg twice daily -Monitor creatinine, ins and outs  2.  Atrial fibrillation -Maintaining sinus rhythm, continue Toprol-XL 12.5 mg daily -Profound anemia, hold Eliquis  3.  Anemia -Iron levels low -Recommend holding Eliquis -Work-up, management as per medicine team.  Recommend transfusing to keep hemoglobin greater than 8.  4.  Profound weakness -PT, OT as per medicine team  5.  Urinary obstruction s/p indwelling Foley, UTI, pneumonia -Antibiotics as per primary team  Total encounter time more than 110 minutes  Greater than 50% was spent in counseling and coordination of care with the patient    Signed, Kate Sable, MD  12/11/2021 2:21 PM

## 2021-12-11 NOTE — ED Notes (Signed)
MD Zhang notified about patient's BP, current reading 118/38.

## 2021-12-11 NOTE — Sepsis Progress Note (Signed)
Nurse states she drew blood cultures prior to giving antibiotics.

## 2021-12-11 NOTE — ED Notes (Signed)
Patient changed and repositioned. Wound present to sacrum, meriplex dressing and barrier cream applied.

## 2021-12-11 NOTE — Progress Notes (Signed)
PROGRESS NOTE    [name]  FMB:[id number] DOB: Jan 19, 1934 DOA: 12/11/2021 PCP: [name], MD    Brief Narrative:  [name] is a 88 y.o. male with medical history significant for anxiety, asthma, CAD, CHF, CKD, who presented to the ER with acute onset of generalized weakness and fatigue.  He admitted to recent dry cough and stated that he has been recently treated for pneumonia few weeks ago.  Chest x-ray showed evidence of pulmonary edema, questionable pneumonia.  Patient was giving IV Lasix, antibiotics.  Assessment & Plan:   Principal Problem:   CAP (community acquired pneumonia)  Acute on chronic diastolic congestive heart failure. Patient has evidence of volume overload, patient had increased leg edema and abdominal distention, chest x-ray showed pulmonary edema.  Feet examination showed signal crackles in the lung base.  Continue IV Lasix. Not sure patient had a pneumonia, initial procalcitonin level not elevated.  I will repeat another level tomorrow.  If still not elevated, antibiotics will be discontinued. Consult cardiology per family request.  Generalized weakness. Iron deficient anemia. Has a mild iron deficiency with normal ferritin.  Will start oral iron supplement.  B12 level normal.  Hemoglobin today 7.4.  Reviewed blood work over the last 2 years, hemoglobin has been varied between 7.8-11.5.  We will continue check hemoglobin, transfuse as needed.  Discussed with patient daughter, patient does not have any black stool or rectal bleeding. Obtain PT/OT.  It is possible that patient will need nursing home placement again.  Paroxysmal atrial fibrillation. Continue Eliquis and Toprol.  Indwelling Foley catheter. Patient has abnormal urine due to indwelling Foley catheter.  No evidence of UTI.      DVT prophylaxis: Eliquis Code Status: full Family Communication: Daughter updated on the phone. Disposition Plan:    Status is: Inpatient  Remains  inpatient appropriate because: Severity of disease, IV Lasix.   No intake/output data recorded. Total I/O In: -  Out: 150 [Urine:150]     Consultants:  None  Procedures: None  Antimicrobials: Rocephin and Zithromax.  Subjective: Patient does not feel short of breath, however he states that he has gained some weight with leg edema, abdominal distention. Denies any abdominal pain or nausea vomiting. No fever chills  No dysuria hematuria.  Objective: Vitals:   12/11/21 0845 12/11/21 0900 12/11/21 1000 12/11/21 1030  BP:  103/61 (!) 102/52 104/77  Pulse: 80 85 80 79  Resp: (!) 26 19 20  (!) 25  Temp:      TempSrc:      SpO2: 97% 100% 99% 100%  Weight:      Height:        Intake/Output Summary (Last 24 hours) at 12/11/2021 1240 Last data filed at 12/11/2021 0924 Gross per 24 hour  Intake --  Output 150 ml  Net -150 ml   Filed Weights   12/10/21 1641  Weight: 90.7 kg    Examination:  General exam: Appears calm and comfortable  Respiratory system: Diffuse crackles in the base bilaterally. Respiratory effort normal. Cardiovascular system: S1 & S2 heard, RRR. No JVD, murmurs, rubs, gallops or clicks. 1+ pedal edema. Gastrointestinal system: Abdomen is nondistended, soft and nontender. No organomegaly or masses felt. Normal bowel sounds heard. Central nervous system: Alert and oriented. No focal neurological deficits. Extremities: Symmetric 5 x 5 power. Skin: No rashes, lesions or ulcers Psychiatry: Judgement and insight appear normal. Mood & affect appropriate.     Data Reviewed: I have personally reviewed following labs and  imaging studies  CBC: Recent Labs  Lab 12/10/21 1639 12/11/21 0547  WBC 6.2 4.7  NEUTROABS 4.9  --   HGB 9.2* 7.4*  HCT 28.8* 23.0*  MCV 102.1* 100.9*  PLT 209 366   Basic Metabolic Panel: Recent Labs  Lab 12/10/21 1639 12/11/21 0547  NA 141 136  K 4.0 3.6  CL 99 98  CO2 33* 31  GLUCOSE 95 103*  BUN 28* 24*  CREATININE  1.41* 1.16  CALCIUM 8.4* 7.7*   GFR: Estimated Creatinine Clearance: 51.8 mL/min (by C-G formula based on SCr of 1.16 mg/dL). Liver Function Tests: Recent Labs  Lab 12/10/21 1639  AST 19  ALT 11  ALKPHOS 56  BILITOT 0.6  PROT 6.2*  ALBUMIN 3.2*   No results for input(s): LIPASE, AMYLASE in the last 168 hours. No results for input(s): AMMONIA in the last 168 hours. Coagulation Profile: No results for input(s): INR, PROTIME in the last 168 hours. Cardiac Enzymes: No results for input(s): CKTOTAL, CKMB, CKMBINDEX, TROPONINI in the last 168 hours. BNP (last 3 results) No results for input(s): PROBNP in the last 8760 hours. HbA1C: No results for input(s): HGBA1C in the last 72 hours. CBG: No results for input(s): GLUCAP in the last 168 hours. Lipid Profile: No results for input(s): CHOL, HDL, LDLCALC, TRIG, CHOLHDL, LDLDIRECT in the last 72 hours. Thyroid Function Tests: No results for input(s): TSH, T4TOTAL, FREET4, T3FREE, THYROIDAB in the last 72 hours. Anemia Panel: Recent Labs    12/11/21 0547  FERRITIN 114  TIBC 168*  IRON 29*   Sepsis Labs: Recent Labs  Lab 12/10/21 1647 12/11/21 0300 12/11/21 0547  PROCALCITON 0.33  --  0.19  LATICACIDVEN  --  1.0  --     Recent Results (from the past 240 hour(s))  Resp Panel by RT-PCR (Flu A&B, Covid) Nasopharyngeal Swab     Status: None   Collection Time: 12/11/21  3:00 AM   Specimen: Nasopharyngeal Swab; Nasopharyngeal(NP) swabs in vial transport medium  Result Value Ref Range Status   SARS Coronavirus 2 by RT PCR NEGATIVE NEGATIVE Final    Comment: (NOTE) SARS-CoV-2 target nucleic acids are NOT DETECTED.  The SARS-CoV-2 RNA is generally detectable in upper respiratory specimens during the acute phase of infection. The lowest concentration of SARS-CoV-2 viral copies this assay can detect is 138 copies/mL. A negative result does not preclude SARS-Cov-2 infection and should not be used as the sole basis for treatment  or other patient management decisions. A negative result may occur with  improper specimen collection/handling, submission of specimen other than nasopharyngeal swab, presence of viral mutation(s) within the areas targeted by this assay, and inadequate number of viral copies(<138 copies/mL). A negative result must be combined with clinical observations, patient history, and epidemiological information. The expected result is Negative.  Fact Sheet for Patients:  EntrepreneurPulse.com.au  Fact Sheet for Healthcare Providers:  IncredibleEmployment.be  This test is no t yet approved or cleared by the [place] FDA and  has been authorized for detection and/or diagnosis of SARS-CoV-2 by FDA under an Emergency Use Authorization (EUA). This EUA will remain  in effect (meaning this test can be used) for the duration of the COVID-19 declaration under Section 564(b)(1) of the Act, 21 U.S.C.section 360bbb-3(b)(1), unless the authorization is terminated  or revoked sooner.       Influenza A by PCR NEGATIVE NEGATIVE Final   Influenza B by PCR NEGATIVE NEGATIVE Final    Comment: (NOTE) The Xpert Xpress SARS-CoV-2/FLU/RSV plus  assay is intended as an aid in the diagnosis of influenza from Nasopharyngeal swab specimens and should not be used as a sole basis for treatment. Nasal washings and aspirates are unacceptable for Xpert Xpress SARS-CoV-2/FLU/RSV testing.  Fact Sheet for Patients: EntrepreneurPulse.com.au  Fact Sheet for Healthcare Providers: IncredibleEmployment.be  This test is not yet approved or cleared by the [place] FDA and has been authorized for detection and/or diagnosis of SARS-CoV-2 by FDA under an Emergency Use Authorization (EUA). This EUA will remain in effect (meaning this test can be used) for the duration of the COVID-19 declaration under Section 564(b)(1) of the Act, 21 U.S.C. section  360bbb-3(b)(1), unless the authorization is terminated or revoked.  Performed at [facility], [address] [place], [place] [address]   Culture, blood (Routine X 2) w Reflex to ID Panel     Status: None (Preliminary result)   Collection Time: 12/11/21  3:00 AM   Specimen: BLOOD  Result Value Ref Range Status   Specimen Description BLOOD BLOOD LEFT FOREARM  Final   Special Requests   Final    BOTTLES DRAWN AEROBIC AND ANAEROBIC Blood Culture results may not be optimal due to an inadequate volume of blood received in culture bottles   Culture   Final    NO GROWTH < 12 HOURS Performed at [facility], [address], [place], [place] [address]    Report Status PENDING  Incomplete  Culture, blood (Routine X 2) w Reflex to ID Panel     Status: None (Preliminary result)   Collection Time: 12/11/21  3:00 AM   Specimen: BLOOD  Result Value Ref Range Status   Specimen Description BLOOD LEFT ANTECUBITAL  Final   Special Requests   Final    BOTTLES DRAWN AEROBIC AND ANAEROBIC Blood Culture results may not be optimal due to an inadequate volume of blood received in culture bottles   Culture   Final    NO GROWTH < 12 HOURS Performed at [facility], [address] [place], [place] [address]    Report Status PENDING  Incomplete         Radiology Studies: DG Chest 1 View  Result Date: 12/10/2021 CLINICAL DATA:  weakness EXAM: CHEST  1 VIEW COMPARISON:  Chest x-ray 11/16/2021, CT chest 05/30/2018 FINDINGS: The heart and mediastinal contours are unchanged. Aortic calcification. Prominent hilar vasculature. Azygous fissure. Left basilar streaky airspace opacities. Question developing airspace opacity within the lingula. No focal consolidation. Increased interstitial markings. No pleural effusion. No pneumothorax. No acute osseous abnormality. IMPRESSION: 1. Pulmonary edema. 2. Question developing airspace opacity within the lingula may represent  infection/inflammation. 3. Left basilar streaky airspace opacity may represent atelectasis versus infection/inflammation. 4.  Aortic Atherosclerosis (ICD10-I70.0). Electronically Signed   By: Iven Finn M.D.   On: 12/10/2021 17:21   CT Head Wo Contrast  Result Date: 12/10/2021 CLINICAL DATA:  Mental status change, unknown cause, generalized weakness EXAM: CT HEAD WITHOUT CONTRAST TECHNIQUE: Contiguous axial images were obtained from the base of the skull through the vertex without intravenous contrast. RADIATION DOSE REDUCTION: This exam was performed according to the departmental dose-optimization program which includes automated exposure control, adjustment of the mA and/or kV according to patient size and/or use of iterative reconstruction technique. COMPARISON:  None. BRAIN: BRAIN Cerebral ventricle sizes are concordant with the degree of cerebral volume loss. Patchy and confluent areas of decreased attenuation are noted throughout the deep and periventricular white matter of the cerebral hemispheres bilaterally, compatible with  chronic microvascular ischemic disease. No evidence of large-territorial acute infarction. No parenchymal hemorrhage. No mass lesion. No extra-axial collection. No mass effect or midline shift. No hydrocephalus. Basilar cisterns are patent. Vascular: No hyperdense vessel. Atherosclerotic calcifications are present within the cavernous internal carotid arteries. Skull: No acute fracture or focal lesion. Sinuses/Orbits: Bilateral maxillary and sphenoid mucosal thickening. Mild mucosal thickening of the ethmoid sinuses. Remaining visualized paranasal sinuses and mastoid air cells are clear. The orbits are unremarkable. Other: None. IMPRESSION: No acute intracranial abnormality. Electronically Signed   By: Iven Finn M.D.   On: 12/10/2021 18:33        Scheduled Meds:  acidophilus  1 capsule Oral Daily   ALPRAZolam  0.5 mg Oral QHS   apixaban  5 mg Oral BID   vitamin C   1,000 mg Oral Daily   fluconazole  100 mg Oral Daily   fluticasone  2 spray Each Nare BID   folic acid  1,497 mcg Oral Daily   furosemide  20 mg Intravenous Q12H   gabapentin  600 mg Oral BID AC & HS   guaiFENesin  600 mg Oral BID   hydroxychloroquine  200 mg Oral BID   loratadine  10 mg Oral Daily   [START ON 12/15/2021] methotrexate  25 mg Oral Weekly   metoprolol succinate  12.5 mg Oral BID   mirabegron ER  50 mg Oral Daily   multivitamin with minerals  1 tablet Oral Daily   pantoprazole  40 mg Oral Daily   polyethylene glycol  17 g Oral QHS   pravastatin  20 mg Oral q1800   traZODone  50 mg Oral QHS   Continuous Infusions:  azithromycin     cefTRIAXone (ROCEPHIN)  IV       LOS: 0 days    Time spent: no charge    Sharen Hones, MD [facility]   To contact the attending provider between 7A-7P or the covering provider during after hours 7P-7A, please log into the web site [email or web address] and access using universal  password for that web site. If you do not have the password, please call the hospital operator.  12/11/2021, 12:40 PM

## 2021-12-11 NOTE — Progress Notes (Signed)
PT Cancellation Note  Patient Details Name: [name] MRN: [id number] DOB: 1934/06/23   Cancelled Treatment:    Reason Eval/Treat Not Completed: Patient declined, no reason specified. Orders received and cahrt reviewed. Upon entry to room pt politely declining PT eval. Reports getting < 3 hours of sleep and has not eaten. Believes will put forth better effort tomorrow as he is hopeful to return home so he can continue [facility] PT which he was receiving prior to admission. PT will re-attempt as able at a later time and date.   Salem Caster. Fairly IV, PT, DPT Physical Therapist- [facility]  12/11/2021, 3:03 PM

## 2021-12-11 NOTE — H&P (Signed)
[place]   PATIENT NAME: [name]    MR#:  [id number]  DATE OF BIRTH:  1934-04-12  DATE OF ADMISSION:  12/11/2021  PRIMARY CARE PHYSICIAN: [name], MD   Patient is coming from: Home  REQUESTING/REFERRING PHYSICIAN: Ward, Cyril Mourning, DO  CHIEF COMPLAINT:   Chief Complaint  Patient presents with   Weakness    HISTORY OF PRESENT ILLNESS:  [name] is a 88 y.o. male with medical history significant for anxiety, asthma, CAD, CHF, CKD, who presented to the ER with acute onset of generalized weakness and fatigue.  He admitted to recent dry cough and stated that he has been recently treated for pneumonia few weeks ago.  He denies any abdominal pain or chest pain.  No wheezing.  He denies any nausea or vomiting or melena or bright red blood per rectum.  ED Course: When he came to the ER BP was 105/41 with otherwise normal vital signs.  Labs revealed calcium of 8.4 and a BUN of 28 with a creatinine of 1.4 and albumin 3.2 with troponin of 6.2.  UA was positive for UTI.  Influenza antigens and COVID-19 PCR came back negative.  High-sensitivity troponin I was 22.  BNP was 585.  CBC showed anemia worse than previous levels with hemoglobin of 7.4 hematocrit 23 compared to 9.2 and 28.8 on 1/13.  Lactic acid was 1 and procalcitonin 0.33.  Chest x-ray showed pulmonary edema and questionable developing airspace opacity within the lingula that may represent infection/inflammation as well as left basal streaky airspace opacity that may represent atelectasis versus infection with aortic atherosclerosis.  Noncontrasted CT scan revealed no acute intracranial abnormalities.    EKG showed normal sinus rhythm with a rate of 75 with right bundle branch block, left intrafascicular block (bifascicular block) with T wave inversion laterally and flattened T waves inferiorly.  The patient was given IV Rocephin and Zithromax as well as 1 g of p.o. Tylenol.  He will be admitted to a medical  telemetry bed for further evaluation and management.  PAST MEDICAL HISTORY:   Past Medical History:  Diagnosis Date   (HFpEF) heart failure with preserved ejection fraction ([place])    a. 02/2019 Echo: Ef 60-65%, mildly reduced RV fxn. RVSP 81mmHg. Mildly dil LA. Mod dil RA. Mild to mod TR. Mild to mod AS. Triv AI.   Allergy    Anemia    Anxiety    Arthritis    rheumatoid   Asthma    Basal cell carcinoma    back   CAD (coronary artery disease)    CHF (congestive heart failure) (HCC)    Chronic kidney disease    Mass right kidney   Chronic sinusitis    Collagen vascular disease (HCC)    Rheumatoid Arthritis   Depression    Diverticulitis    Dysrhythmia    ED (erectile dysfunction)    GERD (gastroesophageal reflux disease)    History of SIADH    Hx of adenomatous colonic polyps    Hypertension    Hyponatremia    IBS (irritable bowel syndrome)    Neurodermatitis    Neurogenic bladder    OSA (obstructive sleep apnea)    no CPAP since weight loss   Persistent atrial fibrillation ([place])    a. Dx 02/2019; b. CHA2DS2VASc = 6-->eliquis initiated.   Prostate cancer [facility])    Renal mass    10/2018 4.2cm R renal cortical mass. Most compatible w/ clear cell renal  cell carcinoma.    PAST SURGICAL HISTORY:   Past Surgical History:  Procedure Laterality Date   CARDIOVERSION N/A 04/23/2019   Procedure: CARDIOVERSION;  Surgeon: Nelva Bush, MD;  Location: [facility] ORS;  Service: Cardiovascular;  Laterality: N/A;   KNEE ARTHROPLASTY Right 09/02/2015   Procedure: COMPUTER ASSISTED TOTAL KNEE ARTHROPLASTY;  Surgeon: [name], MD;  Location: [facility] ORS;  Service: Orthopedics;  Laterality: Right;   LAPAROSCOPIC NEPHRECTOMY, HAND ASSISTED Right 06/17/2019   Procedure: HAND ASSISTED LAPAROSCOPIC NEPHRECTOMY;  Surgeon: Hollice Espy, MD;  Location: [facility] ORS;  Service: Urology;  Laterality: Right;   NASAL SINUS SURGERY  2009   DEVIATED SEPTUM AND POLYPS    permanent indwelling catheter     PROSTATE SURGERY     PROSTATECTOMY   TOTAL KNEE ARTHROPLASTY Left 9/15   Dr [name]   URETHRAL STRICTURE DILATATION  02-2010   Dr.Cope    SOCIAL HISTORY:   Social History   Tobacco Use   Smoking status: Never   Smokeless tobacco: Never  Substance Use Topics   Alcohol use: Yes    Comment: 5 days out of 7 drinks scotch    FAMILY HISTORY:   Family History  Problem Relation Age of Onset   Heart disease Mother 27       heart failure   Heart failure Mother    Pneumonia Father 90       pnemonia   Skin cancer Father    Skin cancer Sister    Skin cancer Son    Colon cancer Neg Hx    Esophageal cancer Neg Hx    Stomach cancer Neg Hx    Pancreatic cancer Neg Hx    Liver disease Neg Hx     DRUG ALLERGIES:   Allergies  Allergen Reactions   Chlorhexidine    Ciprofloxacin Nausea And Vomiting    Headache   Citalopram Hydrobromide Other (See Comments)    "unknown"   Clindamycin Nausea And Vomiting   Lorazepam Other (See Comments)    Adverse reaction   Paroxetine Nausea Only   Ramipril Other (See Comments)    "unknown"   Simvastatin    Sulfa Antibiotics Other (See Comments)    REVIEW OF SYSTEMS:   ROS As per history of present illness. All pertinent systems were reviewed above. Constitutional, HEENT, cardiovascular, respiratory, GI, GU, musculoskeletal, neuro, psychiatric, endocrine, integumentary and hematologic systems were reviewed and are otherwise negative/unremarkable except for positive findings mentioned above in the HPI.   MEDICATIONS AT HOME:   Prior to Admission medications   Medication Sig Start Date End Date Taking? Authorizing Provider  acetaminophen (TYLENOL) 500 MG tablet Take 500 mg by mouth 2 (two) times daily. May take an additional 500mg  as needed for arthritis    [provider]  albuterol (VENTOLIN HFA) 108 (90 Base) MCG/ACT inhaler Inhale 2 puffs into the lungs every 6 (six)  hours as needed for wheezing or shortness of breath. 10/07/21   Val Riles, MD  ALPRAZolam Duanne Moron) 0.5 MG tablet Take 1 tablet (0.5 mg total) by mouth at bedtime. 10/07/21   Val Riles, MD  apixaban (ELIQUIS) 5 MG TABS tablet Take 1 tablet (5 mg total) by mouth 2 (two) times daily. 09/28/21   Max Sane, MD  Ascorbic Acid (VITAMIN C PO) Take 1,000 mg by mouth daily.     [provider]  cetirizine (ZYRTEC) 10 MG tablet Take 10 mg by mouth at bedtime.    [provider]  chlorhexidine (PERIDEX) 0.12 %  solution 1 mL by Mouth Rinse route as needed. 10/13/20   [provider]  Cholecalciferol (VITAMIN D3) 5000 UNITS CAPS Take 2,000 Units by mouth daily.     [provider]  Coenzyme Q10 (CO Q 10) 100 MG CAPS Take 100 mg by mouth daily.     [provider]  CRANBERRY SOFT PO Take 250 mg by mouth daily.    [provider]  dextromethorphan-guaiFENesin (MUCINEX DM) 30-600 MG 12hr tablet Take 1 tablet by mouth 2 (two) times daily as needed for cough. Patient not taking: Reported on 12/07/2021 10/07/21   Val Riles, MD  EPINEPHrine 0.3 mg/0.3 mL IJ SOAJ injection Inject 0.3 mg into the muscle as needed for anaphylaxis.    [provider]  fluconazole (DIFLUCAN) 100 MG tablet Take 1 tablet (100 mg total) by mouth daily. 12/03/21   [name], MD  fluticasone (FLONASE) 50 MCG/ACT nasal spray Place 2 sprays into both nostrils 2 (two) times daily. 04/27/17   [name], MD  folic acid (FOLVITE) 916 MCG tablet Take 1,247 mcg by mouth daily.    [provider]  gabapentin (NEURONTIN) 600 MG tablet Take 600 mg by mouth 2 (two) times daily at 8 am and 10 pm.    [provider]  hydroxychloroquine (PLAQUENIL) 200 MG tablet Take 200 mg by mouth 2 (two) times daily.  01/22/16   [provider]  ketoconazole (NIZORAL) 2 % cream Apply 1 application topically 2 (two) times daily. 02/12/21   Vaillancourt, Aldona Bar,  PA-C  ketoconazole (NIZORAL) 2 % shampoo Apply 1 application topically 2 (two) times a week. 06/15/20   [provider]  methotrexate (RHEUMATREX) 2.5 MG tablet Take 10 tablets by mouth once a week. Taken on Wednesdays 07/01/19   [provider]  metoprolol succinate (TOPROL-XL) 25 MG 24 hr tablet Take 0.5 tablets (12.5 mg total) by mouth 2 (two) times daily. 10/19/21   Isla Pence, MD  Multiple Vitamin (MULTIVITAMIN) tablet Take 1 tablet by mouth daily.    [provider]  Multiple Vitamins-Minerals (CENTRUM FRESH/FRUITY 50+ PO) Take 1 tablet by mouth daily.    [provider]  MYRBETRIQ 50 MG TB24 tablet TAKE 1 TABLET(50 MG) BY MOUTH DAILY Patient taking differently: Take 50 mg by mouth. 09/09/21   Hollice Espy, MD  nystatin cream (MYCOSTATIN) Apply 1 application topically 2 (two) times daily. 12/02/21   Zara Council A, PA-C  omeprazole (PRILOSEC) 20 MG capsule Take 1 capsule (20 mg total) by mouth daily as needed. 02/09/21   [name], MD  polyethylene glycol powder (GLYCOLAX/MIRALAX) 17 GM/SCOOP powder Take 17 g by mouth at bedtime.    [provider]  pravastatin (PRAVACHOL) 20 MG tablet TAKE 1 TABLET(20 MG) BY MOUTH AT BEDTIME Patient taking differently: Take 20 mg by mouth daily. 01/18/21   [name], MD  Probiotic Product (ALIGN) 4 MG CAPS Take 1 capsule (4 mg total) by mouth daily. 12/15/16   Nicholes Mango, MD  traZODone (DESYREL) 50 MG tablet Take 50 mg by mouth at bedtime. 08/12/21   [provider]  triamcinolone ointment (KENALOG) 0.1 % Apply 1 application topically as needed. 10/06/20   [provider]  Turmeric 500 MG CAPS Take 500 mg by mouth daily.     [provider]  Zinc 50 MG TABS Take by mouth.    [provider]      VITAL SIGNS:  Blood pressure (!) 105/42, pulse 81, temperature 98.7  F (37.1 C), temperature source Oral, resp. rate (!) 22, height 5\' 9"  (1.753 m), weight 90.7  kg, SpO2 95 %.  PHYSICAL EXAMINATION:  Physical Exam  GENERAL:  88 y.o.-year-old Caucasian male patient lying in the bed with no acute distress.  EYES: Pupils equal, round, reactive to light and accommodation. No scleral icterus. Extraocular muscles intact.  HEENT: Head atraumatic, normocephalic. Oropharynx and nasopharynx clear.  NECK:  Supple, no jugular venous distention. No thyroid enlargement, no tenderness.  LUNGS: Diminished bibasilar breath sounds with bibasal rales as well as left midlung zone crackles CARDIOVASCULAR: Regular rate and rhythm, S1, S2 normal. No murmurs, rubs, or gallops.  ABDOMEN: Soft, nondistended, nontender. Bowel sounds present. No organomegaly or mass.  EXTREMITIES: Trace to 1+ bilateral lower extremity pitting edema with no cyanosis, or clubbing.  NEUROLOGIC: Cranial nerves II through XII are intact. Muscle strength 5/5 in all extremities. Sensation intact. Gait not checked.  PSYCHIATRIC: The patient is alert and oriented x 3.  Normal affect and good eye contact. SKIN: No obvious rash, lesion, or ulcer.   LABORATORY PANEL:   CBC Recent Labs  Lab 12/10/21 1639  WBC 6.2  HGB 9.2*  HCT 28.8*  PLT 209   ------------------------------------------------------------------------------------------------------------------  Chemistries  Recent Labs  Lab 12/10/21 1639  NA 141  K 4.0  CL 99  CO2 33*  GLUCOSE 95  BUN 28*  CREATININE 1.41*  CALCIUM 8.4*  AST 19  ALT 11  ALKPHOS 56  BILITOT 0.6   ------------------------------------------------------------------------------------------------------------------  Cardiac Enzymes No results for input(s): TROPONINI in the last 168 hours. ------------------------------------------------------------------------------------------------------------------  RADIOLOGY:  DG Chest 1 View  Result Date: 12/10/2021 CLINICAL DATA:  weakness EXAM: CHEST  1 VIEW COMPARISON:  Chest x-ray 11/16/2021, CT chest  05/30/2018 FINDINGS: The heart and mediastinal contours are unchanged. Aortic calcification. Prominent hilar vasculature. Azygous fissure. Left basilar streaky airspace opacities. Question developing airspace opacity within the lingula. No focal consolidation. Increased interstitial markings. No pleural effusion. No pneumothorax. No acute osseous abnormality. IMPRESSION: 1. Pulmonary edema. 2. Question developing airspace opacity within the lingula may represent infection/inflammation. 3. Left basilar streaky airspace opacity may represent atelectasis versus infection/inflammation. 4.  Aortic Atherosclerosis (ICD10-I70.0). Electronically Signed   By: Iven Finn M.D.   On: 12/10/2021 17:21   CT Head Wo Contrast  Result Date: 12/10/2021 CLINICAL DATA:  Mental status change, unknown cause, generalized weakness EXAM: CT HEAD WITHOUT CONTRAST TECHNIQUE: Contiguous axial images were obtained from the base of the skull through the vertex without intravenous contrast. RADIATION DOSE REDUCTION: This exam was performed according to the departmental dose-optimization program which includes automated exposure control, adjustment of the mA and/or kV according to patient size and/or use of iterative reconstruction technique. COMPARISON:  None. BRAIN: BRAIN Cerebral ventricle sizes are concordant with the degree of cerebral volume loss. Patchy and confluent areas of decreased attenuation are noted throughout the deep and periventricular white matter of the cerebral hemispheres bilaterally, compatible with chronic microvascular ischemic disease. No evidence of large-territorial acute infarction. No parenchymal hemorrhage. No mass lesion. No extra-axial collection. No mass effect or midline shift. No hydrocephalus. Basilar cisterns are patent. Vascular: No hyperdense vessel. Atherosclerotic calcifications are present within the cavernous internal carotid arteries. Skull: No acute fracture or focal lesion. Sinuses/Orbits:  Bilateral maxillary and sphenoid mucosal thickening. Mild mucosal thickening of the ethmoid sinuses. Remaining visualized paranasal sinuses and mastoid air cells are clear. The orbits are unremarkable. Other: None. IMPRESSION: No acute intracranial abnormality. Electronically Signed   By: Thomasena Edis  Mckinley Jewel M.D.   On: 12/10/2021 18:33      IMPRESSION AND PLAN:  Principal Problem:   CAP (community acquired pneumonia)  1.  Community-acquired pneumonia with associated generalized weakness. - The patient will be admitted to a medical telemetry bed. - We will continue antibiotic therapy with IV Rocephin and Zithromax. - Mucolytic therapy and bronchodilator therapy will be provided. - We will follow blood and sputum cultures.  2.  Acute on chronic diastolic CHF with EF of 60 to 65% on 04/28/2020 with grade 2 diastolic dysfunction.  This is likely contributing to generalized weakness. - The patient will be diuresed with IV Lasix. - We will follow serial troponins..  3.  Paroxysmal atrial fibrillation - We will continue Eliquis and Toprol-XL.  4.  Dyslipidemia. - We will continue statin therapy and Lovaza.  5.  GERD. - We will continue PPI therapy.  DVT prophylaxis: Eliquis. Code Status: full code. Family Communication:  The plan of care was discussed in details with the patient (and family). I answered all questions. The patient agreed to proceed with the above mentioned plan. Further management will depend upon hospital course. Disposition Plan: Back to previous home environment Consults called: none  All the records are reviewed and case discussed with ED provider.  Status is: Inpatient   At the time of the admission, it appears that the appropriate admission status for this patient is inpatient.  This is judged to be reasonable and necessary in order to provide the required intensity of service to ensure the patient's safety given the presenting symptoms, physical exam findings and  initial radiographic and laboratory data in the context of comorbid conditions.  The patient requires inpatient status due to high intensity of service, high risk of further deterioration and high frequency of surveillance required.  I certify that at the time of admission, it is my clinical judgment that the patient will require inpatient hospital care extending more than 2 midnights.                            Dispo: The patient is from: Home              Anticipated d/c is to: Home              Patient currently is not medically stable to d/c.              Difficult to place patient: No     Christel Mormon M.D on 12/11/2021 at 4:56 AM  [facility]   From 7 PM-7 AM, contact night-coverage [email or web address]  CC: Primary care physician; [name], MD

## 2021-12-12 ENCOUNTER — Inpatient Hospital Stay (HOSPITAL_COMMUNITY)
Admit: 2021-12-12 | Discharge: 2021-12-12 | Disposition: A | Discharge status: Home / Self Care | Payer: Medicare PPO | Attending: Cardiology | Admitting: Cardiology

## 2021-12-12 DIAGNOSIS — R0609 Other forms of dyspnea: Secondary | ICD-10-CM | POA: Diagnosis not present

## 2021-12-12 LAB — ECHOCARDIOGRAM COMPLETE
AR max vel: 1.38 cm2
AV Area VTI: 1.32 cm2
AV Area mean vel: 1.48 cm2
AV Mean grad: 23 mmHg
AV Peak grad: 38.9 mmHg
Ao pk vel: 3.12 m/s
Area-P 1/2: 3.17 cm2
Calc EF: 75.4 %
S' Lateral: 3 cm
Single Plane A2C EF: 74.6 %
Single Plane A4C EF: 77.5 %
Weight: 3301.61 oz

## 2021-12-12 LAB — CBC WITH DIFFERENTIAL/PLATELET
Abs Immature Granulocytes: 0.05 10*3/uL (ref 0.00–0.07)
Basophils Absolute: 0 10*3/uL (ref 0.0–0.1)
Basophils Relative: 1 %
Eosinophils Absolute: 0.2 10*3/uL (ref 0.0–0.5)
Eosinophils Relative: 6 %
HCT: 24.2 % — ABNORMAL LOW (ref 39.0–52.0)
Hemoglobin: 7.8 g/dL — ABNORMAL LOW (ref 13.0–17.0)
Immature Granulocytes: 1 %
Lymphocytes Relative: 20 %
Lymphs Abs: 0.8 10*3/uL (ref 0.7–4.0)
MCH: 32 pg (ref 26.0–34.0)
MCHC: 32.2 g/dL (ref 30.0–36.0)
MCV: 99.2 fL (ref 80.0–100.0)
Monocytes Absolute: 0.1 10*3/uL (ref 0.1–1.0)
Monocytes Relative: 2 %
Neutro Abs: 2.9 10*3/uL (ref 1.7–7.7)
Neutrophils Relative %: 70 %
Platelets: 209 10*3/uL (ref 150–400)
RBC: 2.44 MIL/uL — ABNORMAL LOW (ref 4.22–5.81)
RDW: 15.6 % — ABNORMAL HIGH (ref 11.5–15.5)
WBC: 4.1 10*3/uL (ref 4.0–10.5)
nRBC: 0 % (ref 0.0–0.2)

## 2021-12-12 LAB — BASIC METABOLIC PANEL
Anion gap: 8 (ref 5–15)
BUN: 20 mg/dL (ref 8–23)
CO2: 32 mmol/L (ref 22–32)
Calcium: 7.9 mg/dL — ABNORMAL LOW (ref 8.9–10.3)
Chloride: 99 mmol/L (ref 98–111)
Creatinine, Ser: 1 mg/dL (ref 0.61–1.24)
GFR, Estimated: >60 mL/min (ref >60)
Glucose, Bld: 98 mg/dL (ref 70–99)
Potassium: 3.5 mmol/L (ref 3.5–5.1)
Sodium: 139 mmol/L (ref 135–145)

## 2021-12-12 LAB — URINE CULTURE

## 2021-12-12 LAB — MAGNESIUM: Magnesium: 2 mg/dL (ref 1.7–2.4)

## 2021-12-12 LAB — PROCALCITONIN: Procalcitonin: 0.17 ng/mL

## 2021-12-12 MED ORDER — POTASSIUM CHLORIDE 20 MEQ PO PACK
40.0000 meq | PACK | Freq: Once | ORAL | Status: AC
Start: 2021-12-12 — End: 2021-12-12
  Administered 2021-12-12: 40 meq via ORAL
  Filled 2021-12-12: qty 2

## 2021-12-12 NOTE — Evaluation (Signed)
Physical Therapy Evaluation Patient Details Name: [name] MRN: [id number] DOB: 05/27/1934 Today's Date: 12/12/2021  History of Present Illness  88 y.o. male with medical history significant for anxiety, asthma, CAD, CHF, CKD, who presented to the ER with acute onset of generalized weakness and fatigue.  He admitted to recent dry cough and stated that he has been recently treated for pneumonia few weeks ago. Pt admitted for acute on chronic diastolic congestive heart failure.   Clinical Impression  Patient is a pleasant 88 year old male who presents to physical therapy with generalized weakness and limited mobility. Prior to hospital admission, pt was at home after recent stints in both hospital and at [facility]. Patient lives with his daughter who assists with ADLs and was undergoing home health PT. Currently pt has had a decline in mobility and an increased risk of falls. Patient requires co-treat with OT due to need for assistance, and safety. Patient eager to participate with therapy and transferred with assistance EOB. Patient requires assistance to stand and upon weightbearing on LLE has immediate buckling. Lateral stepping at EOB with RW x2 assistance required additional assistance to remain upright every step on LLE. Due to LLE buckling patient is returned to bed and physician entered room, Patient returned to supine position with education on need for SNF at this time due to safety. Patient does not want to return to the same SNF he was at prior but is potentially open to others if need be.   Pt would benefit from skilled PT to address noted impairments and functional limitations (see below for any additional details).  Upon hospital discharge, pt would benefit from SNF placement due to high fall risk and limited mobility.        Recommendations for follow up therapy are one component of a multi-disciplinary discharge planning process, led by the attending physician.  Recommendations may be  updated based on patient status, additional functional criteria and insurance authorization.  Follow Up Recommendations Skilled nursing-short term rehab (<3 hours/day)    Assistance Recommended at Discharge Frequent or constant Supervision/Assistance  Patient can return home with the following  Two people to help with walking and/or transfers;Two people to help with bathing/dressing/bathroom;Assistance with cooking/housework;Assist for transportation;Help with stairs or ramp for entrance    Equipment Recommendations None recommended by PT  Recommendations for Other Services       Functional Status Assessment Patient has had a recent decline in their functional status and demonstrates the ability to make significant improvements in function in a reasonable and predictable amount of time.     Precautions / Restrictions Precautions Precautions: Fall Restrictions Weight Bearing Restrictions: No      Mobility  Bed Mobility Overal bed mobility: Needs Assistance Bed Mobility: Supine to Sit;Sit to Supine     Supine to sit: Mod assist;+2 for safety/equipment Sit to supine: Mod assist;+2 for physical assistance   General bed mobility comments: cues for sequencing and assistance for LE's    Transfers Overall transfer level: Needs assistance Equipment used: Rolling walker (2 wheels) Transfers: Sit to/from Stand Sit to Stand: Max assist;+2 physical assistance           General transfer comment: Requires physical assist for upward momentum and steadying upon standing; L knee buckling    Ambulation/Gait               General Gait Details: unable to perform due to unsafe mobility  Stairs  Wheelchair Mobility    Modified Rankin (Stroke Patients Only)       Balance Overall balance assessment: Needs assistance Sitting-balance support: No upper extremity supported;Feet supported Sitting balance-[name]: Fair Sitting balance - Comments: Fair  static sitting balance at EOB   Standing balance support: Bilateral upper extremity supported;During functional activity Standing balance-[name]: Poor Standing balance comment: Requires MOD A+2 for taking lateral steps. Noted to have L knee buckling with each steps requiring assistance to remain upright                             Pertinent Vitals/Pain Pain Assessment: No/denies pain    Home Living Family/patient expects to be discharged to:: Private residence Living Arrangements: Children Available Help at Discharge: Family;Available 24 hours/day Type of Home: House Home Access: Stairs to enter Entrance Stairs-Rails: None Entrance Stairs-Number of Steps: 3   Home Layout: Two level;Able to live on main level with bedroom/bathroom Home Equipment: Rolling Walker (2 wheels) Additional Comments: Pt reports that he has a lift to get up the stairs to enter the home    Prior Function Prior Level of Function : Other (comment)             Mobility Comments: Pt reports that until recent hospitalizations (Oct/Nov 2022), he was able to ambulate in the home with RW and walk limited community distances. Following STR, pt required significant physical assistance for sit>stand transfers, but was walking short household distances. Pt was receiving HHPT ADLs Comments: Pt reports that until recent hospitalizations (Oct/Nov 2022), he was independent with feeding, toilet transfers/hygiene, and shower transfers. For past 2-3 years, pt's daughter was providing MAX A for dressing and MOD A for sit>stand bathing.     Hand Dominance   Dominant Hand: Right    Extremity/Trunk Assessment   Upper Extremity Assessment Upper Extremity Assessment: Defer to OT evaluation RUE Deficits / Details: Shoulder flexion AROM 0-30 (aprox) degrees; Elbow flexion/extension 3+/5; Grip strength 4/5 RUE Sensation: WNL LUE Deficits / Details: Shoulder flexion AROM 0-30 (aprox) degrees; Elbow  flexion/extension 3+/5; Grip strength 4/5 LUE Sensation: WNL    Lower Extremity Assessment Lower Extremity Assessment: RLE deficits/detail;LLE deficits/detail RLE Deficits / Details: grossly 4/5; R knee flexion contracture noted. RLE Sensation: WNL LLE Deficits / Details: grossly 3+/5 LLE Sensation: decreased light touch    Cervical / Trunk Assessment Cervical / Trunk Assessment: Normal  Communication   Communication: HOH  Cognition Arousal/Alertness: Awake/alert Behavior During Therapy: WFL for tasks assessed/performed Overall Cognitive Status: Within Functional Limits for tasks assessed                                 General Comments: A and O x 4        General Comments General comments (skin integrity, edema, etc.): patient appears well nourished and groomed.    Exercises Other Exercises Other Exercises: Patient educated on role of PT in acute care setting, safe mobility and transfers, and placement options.   Assessment/Plan    PT Assessment Patient needs continued PT services  PT Problem List Decreased strength;Decreased range of motion;Decreased activity tolerance;Decreased balance;Decreased mobility;Decreased knowledge of use of DME;Cardiopulmonary status limiting activity;Decreased knowledge of precautions;Decreased safety awareness       PT Treatment Interventions DME instruction;Gait training;Stair training;Functional mobility training;Therapeutic activities;Patient/family education;Neuromuscular re-education;Balance training;Therapeutic exercise;Manual techniques    PT Goals (Current goals can be found in the  Care Plan section)  Acute Rehab PT Goals Patient Stated Goal: to get stronger and be able to move again PT Goal Formulation: With patient Time For Goal Achievement: 12/26/21 Potential to Achieve Goals: Fair    Frequency Min 2X/week     Co-evaluation PT/OT/SLP Co-Evaluation/Treatment: Yes Reason for Co-Treatment: Complexity of the  patient's impairments (multi-system involvement);For patient/therapist safety;To address functional/ADL transfers PT goals addressed during session: Mobility/safety with mobility;Balance;Strengthening/ROM OT goals addressed during session: ADL's and self-care;Proper use of Adaptive equipment and DME;Strengthening/ROM       AM-PAC PT "6 Clicks" Mobility  Outcome Measure Help needed turning from your back to your side while in a flat bed without using bedrails?: A Little Help needed moving from lying on your back to sitting on the side of a flat bed without using bedrails?: A Lot Help needed moving to and from a bed to a chair (including a wheelchair)?: A Lot Help needed standing up from a chair using your arms (e.g., wheelchair or bedside chair)?: A Lot Help needed to walk in hospital room?: A Lot Help needed climbing 3-5 steps with a railing? : Total 6 Click Score: 12    End of Session Equipment Utilized During Treatment: Gait belt Activity Tolerance: Patient tolerated treatment well Patient left: in bed;with call bell/phone within reach;with bed alarm set;with nursing/sitter in room Nurse Communication: Mobility status PT Visit Diagnosis: Unsteadiness on feet (R26.81);Other abnormalities of gait and mobility (R26.89);Muscle weakness (generalized) (M62.81);History of falling (Z91.81);Difficulty in walking, not elsewhere classified (R26.2)    Time: 0165-5374 PT Time Calculation (min) (ACUTE ONLY): 33 min   Charges:   PT Evaluation $PT Eval Low Complexity: 1 Low PT Treatments $Therapeutic Activity: 8-22 mins       Janna Arch, PT, DPT  12/12/2021, 1:57 PM

## 2021-12-12 NOTE — Plan of Care (Signed)
Problem: Education: Goal: Ability to demonstrate management of disease process will improve 12/12/2021 0457 by Tarri Abernethy E, LPN Outcome: Progressing 12/11/2021 2123 by Tarri Abernethy E, LPN Outcome: Progressing 12/11/2021 2123 by Tarri Abernethy E, LPN Outcome: Progressing Goal: Ability to verbalize understanding of medication therapies will improve 12/12/2021 0457 by Tarri Abernethy E, LPN Outcome: Progressing 12/11/2021 2123 by Tarri Abernethy E, LPN Outcome: Progressing 12/11/2021 2123 by Tarri Abernethy E, LPN Outcome: Progressing Goal: Individualized Educational Video(s) 12/12/2021 0457 by Charisse March, LPN Outcome: Progressing 12/11/2021 2123 by Tarri Abernethy E, LPN Outcome: Progressing 12/11/2021 2123 by Tarri Abernethy E, LPN Outcome: Progressing   Problem: Activity: Goal: Capacity to carry out activities will improve 12/12/2021 0457 by Tarri Abernethy E, LPN Outcome: Progressing 12/11/2021 2123 by Tarri Abernethy E, LPN Outcome: Progressing 12/11/2021 2123 by Tarri Abernethy E, LPN Outcome: Progressing   Problem: Cardiac: Goal: Ability to achieve and maintain adequate cardiopulmonary perfusion will improve 12/12/2021 0457 by Tarri Abernethy E, LPN Outcome: Progressing 12/11/2021 2123 by Tarri Abernethy E, LPN Outcome: Progressing 12/11/2021 2123 by Tarri Abernethy E, LPN Outcome: Progressing   Problem: Activity: Goal: Ability to tolerate increased activity will improve 12/12/2021 0457 by Tarri Abernethy E, LPN Outcome: Progressing 12/11/2021 2123 by Tarri Abernethy E, LPN Outcome: Progressing 12/11/2021 2123 by Tarri Abernethy E, LPN Outcome: Progressing   Problem: Clinical Measurements: Goal: Ability to maintain a body temperature in the normal range will improve 12/12/2021 0457 by Tarri Abernethy E, LPN Outcome: Progressing 12/11/2021 2123 by Tarri Abernethy E, LPN Outcome: Progressing 12/11/2021 2123 by Tarri Abernethy E, LPN Outcome: Progressing   Problem:  Respiratory: Goal: Ability to maintain adequate ventilation will improve 12/12/2021 0457 by Tarri Abernethy E, LPN Outcome: Progressing 12/11/2021 2123 by Tarri Abernethy E, LPN Outcome: Progressing 12/11/2021 2123 by Tarri Abernethy E, LPN Outcome: Progressing Goal: Ability to maintain a clear airway will improve 12/12/2021 0457 by Tarri Abernethy E, LPN Outcome: Progressing 12/11/2021 2123 by Tarri Abernethy E, LPN Outcome: Progressing 12/11/2021 2123 by Charisse March, LPN Outcome: Progressing   Problem: Education: Goal: Knowledge of General Education information will improve Description: Including pain rating scale, medication(s)/side effects and non-pharmacologic comfort measures 12/12/2021 0457 by Tarri Abernethy E, LPN Outcome: Progressing 12/11/2021 2123 by Tarri Abernethy E, LPN Outcome: Progressing 12/11/2021 2123 by Tarri Abernethy E, LPN Outcome: Progressing   Problem: Health Behavior/Discharge Planning: Goal: Ability to manage health-related needs will improve 12/12/2021 0457 by Tarri Abernethy E, LPN Outcome: Progressing 12/11/2021 2123 by Tarri Abernethy E, LPN Outcome: Progressing 12/11/2021 2123 by Tarri Abernethy E, LPN Outcome: Progressing   Problem: Clinical Measurements: Goal: Ability to maintain clinical measurements within normal limits will improve 12/12/2021 0457 by Tarri Abernethy E, LPN Outcome: Progressing 12/11/2021 2123 by Tarri Abernethy E, LPN Outcome: Progressing 12/11/2021 2123 by Tarri Abernethy E, LPN Outcome: Progressing Goal: Will remain free from infection 12/12/2021 0457 by Tarri Abernethy E, LPN Outcome: Progressing 12/11/2021 2123 by Tarri Abernethy E, LPN Outcome: Progressing 12/11/2021 2123 by Tarri Abernethy E, LPN Outcome: Progressing Goal: Diagnostic test results will improve 12/12/2021 0457 by Tarri Abernethy E, LPN Outcome: Progressing 12/11/2021 2123 by Tarri Abernethy E, LPN Outcome: Progressing 12/11/2021 2123 by Tarri Abernethy E, LPN Outcome:  Progressing Goal: Respiratory complications will improve 12/12/2021 0457 by Tarri Abernethy E, LPN Outcome: Progressing 12/11/2021 2123 by Tarri Abernethy E, LPN Outcome: Progressing 12/11/2021 2123 by Tarri Abernethy E, LPN Outcome: Progressing Goal: Cardiovascular complication will be avoided 12/12/2021 0457 by Tarri Abernethy E, LPN Outcome: Progressing 12/11/2021 2123 by Criss Rosales,  Loris Winrow E, LPN Outcome: Progressing 12/11/2021 2123 by Tarri Abernethy E, LPN Outcome: Progressing   Problem: Activity: Goal: Risk for activity intolerance will decrease 12/12/2021 0457 by Tarri Abernethy E, LPN Outcome: Progressing 12/11/2021 2123 by Tarri Abernethy E, LPN Outcome: Progressing 12/11/2021 2123 by Tarri Abernethy E, LPN Outcome: Progressing   Problem: Nutrition: Goal: Adequate nutrition will be maintained 12/12/2021 0457 by Tarri Abernethy E, LPN Outcome: Progressing 12/11/2021 2123 by Tarri Abernethy E, LPN Outcome: Progressing 12/11/2021 2123 by Tarri Abernethy E, LPN Outcome: Progressing   Problem: Coping: Goal: Level of anxiety will decrease 12/12/2021 0457 by Tarri Abernethy E, LPN Outcome: Progressing 12/11/2021 2123 by Tarri Abernethy E, LPN Outcome: Progressing 12/11/2021 2123 by Tarri Abernethy E, LPN Outcome: Progressing   Problem: Elimination: Goal: Will not experience complications related to bowel motility 12/12/2021 0457 by Tarri Abernethy E, LPN Outcome: Progressing 12/11/2021 2123 by Tarri Abernethy E, LPN Outcome: Progressing 12/11/2021 2123 by Tarri Abernethy E, LPN Outcome: Progressing Goal: Will not experience complications related to urinary retention 12/12/2021 0457 by Tarri Abernethy E, LPN Outcome: Progressing 12/11/2021 2123 by Tarri Abernethy E, LPN Outcome: Progressing 12/11/2021 2123 by Tarri Abernethy E, LPN Outcome: Progressing   Problem: Pain Managment: Goal: General experience of comfort will improve 12/12/2021 0457 by Tarri Abernethy E, LPN Outcome: Progressing 12/11/2021  2123 by Tarri Abernethy E, LPN Outcome: Progressing 12/11/2021 2123 by Tarri Abernethy E, LPN Outcome: Progressing   Problem: Safety: Goal: Ability to remain free from injury will improve 12/12/2021 0457 by Tarri Abernethy E, LPN Outcome: Progressing 12/11/2021 2123 by Tarri Abernethy E, LPN Outcome: Progressing 12/11/2021 2123 by Tarri Abernethy E, LPN Outcome: Progressing   Problem: Skin Integrity: Goal: Risk for impaired skin integrity will decrease 12/12/2021 0457 by Tarri Abernethy E, LPN Outcome: Progressing 12/11/2021 2123 by Tarri Abernethy E, LPN Outcome: Progressing 12/11/2021 2123 by Tarri Abernethy E, LPN Outcome: Progressing

## 2021-12-12 NOTE — Evaluation (Signed)
Occupational Therapy Evaluation Patient Details Name: [name] MRN: [id number] DOB: [birth year] Today's Date: 12/12/2021   History of Present Illness 88 y.o. male with medical history significant for anxiety, asthma, CAD, CHF, CKD, who presented to the ER with acute onset of generalized weakness and fatigue.  He admitted to recent dry cough and stated that he has been recently treated for pneumonia few weeks ago. Pt admitted for acute on chronic diastolic congestive heart failure.   Clinical Impression   Pt seen for OT/PT co-evaluation on this date. Upon arrival to room, pt awake and seated upright in bed. Pt agreeable to tx. This [facility] is familiar with pt from recent hospital admission. Prior to recent hospitalizations (Oct/Nov 2022), pt was mod-independent with RW for functional mobility of household distances, toilet transfers/hygiene, and shower transfers. At baseline, pt requires MAX A for LB dressing and MOD A for sit>stand bathing. Pt currently requires MOD A+2 for bed mobility, MAX A to don hearing aids while seated EOB, MAX A for seated LB dressing, MAX A+2 for sit>stand transfers, and MOD A+2 for taking lateral steps at EOB due to current functional impairments (See OT Problem List below). Pt would benefit from additional skilled OT services to maximize return to PLOF and minimize risk of future falls, injury, caregiver burden, and readmission. Upon discharge, recommend SNF.   Recommendations for follow up therapy are one component of a multi-disciplinary discharge planning process, led by the attending physician.  Recommendations may be updated based on patient status, additional functional criteria and insurance authorization.   Follow Up Recommendations  Skilled nursing-short term rehab (<3 hours/day)    Assistance Recommended at Discharge Intermittent Supervision/Assistance  Patient can return home with the following Two people to help with walking and/or transfers;Two people to  help with bathing/dressing/bathroom    Functional Status Assessment  Patient has had a recent decline in their functional status and/or demonstrates limited ability to make significant improvements in function in a reasonable and predictable amount of time  Equipment Recommendations  None recommended by OT       Precautions / Restrictions Precautions Precautions: Fall Restrictions Weight Bearing Restrictions: No      Mobility Bed Mobility Overal bed mobility: Needs Assistance Bed Mobility: Supine to Sit;Sit to Supine     Supine to sit: Mod assist;+2 for safety/equipment Sit to supine: Mod assist;+2 for physical assistance        Transfers Overall transfer level: Needs assistance Equipment used: Rolling walker (2 wheels) Transfers: Sit to/from Stand Sit to Stand: Max assist;+2 physical assistance           General transfer comment: Requires physical assist for upward momentum and steadying upon standing      Balance Overall balance assessment: Needs assistance Sitting-balance support: No upper extremity supported;Feet supported Sitting balance-[name]: Fair Sitting balance - Comments: Fair static sitting balance at EOB   Standing balance support: Bilateral upper extremity supported;During functional activity Standing balance-[name]: Poor Standing balance comment: Requires MOD A+2 for taking lateral steps. Noted to have L knee buckling with each steps                           ADL either performed or assessed with clinical judgement   ADL Overall ADL's : Needs assistance/impaired     Grooming: Maximal assistance;Sitting (to don hearing aids) Grooming Details (indicate cue type and reason): MAX A to don hearing aides in setting of limited shoulder ROM  Lower Body Dressing: Maximal assistance;Sitting/lateral leans Lower Body Dressing Details (indicate cue type and reason): to don/doff socks                      Vision Baseline Vision/History: 1 Wears glasses Ability to See in Adequate Light: 0 Adequate Patient Visual Report: No change from baseline              Pertinent Vitals/Pain Pain Assessment: No/denies pain     Hand Dominance Right   Extremity/Trunk Assessment Upper Extremity Assessment Upper Extremity Assessment: RUE deficits/detail;LUE deficits/detail RUE Deficits / Details: Shoulder flexion AROM 0-30 (aprox) degrees; Elbow flexion/extension 3+/5; Grip strength 4/5 RUE Sensation: WNL LUE Deficits / Details: Shoulder flexion AROM 0-30 (aprox) degrees; Elbow flexion/extension 3+/5; Grip strength 4/5 LUE Sensation: WNL   Lower Extremity Assessment Lower Extremity Assessment: Generalized weakness       Communication Communication Communication: HOH   Cognition Arousal/Alertness: Awake/alert Behavior During Therapy: WFL for tasks assessed/performed Overall Cognitive Status: Within Functional Limits for tasks assessed                                                  Home Living Family/patient expects to be discharged to:: Private residence Living Arrangements: Children Available Help at Discharge: Family;Available 24 hours/day Type of Home: House Home Access: Stairs to enter [employer] of Steps: 3 Entrance Stairs-Rails: None Home Layout: Two level;Able to live on main level with bedroom/bathroom     Bathroom Shower/Tub: Walk-in shower         Home Equipment: Conservation officer, nature (2 wheels)   Additional Comments: Pt reports that he has a lift to get up the stairs to enter the home      Prior Functioning/Environment Prior Level of Function : Other (comment)             Mobility Comments: Pt reports that until recent hospitalizations (Oct/Nov 2022), he was able to ambulate in the home with RW and walk limited community distances. Following STR, pt required significant physical assistance for sit>stand transfers, but was  walking short household distances. Pt was receiving HHPT ADLs Comments: Pt reports that until recent hospitalizations (Oct/Nov 2022), he was independent with feeding, toilet transfers/hygiene, and shower transfers. For past 2-3 years, pt's daughter was providing MAX A for dressing and MOD A for sit>stand bathing.        OT Problem List: Decreased strength;Decreased activity tolerance;Decreased range of motion;Impaired balance (sitting and/or standing);Impaired UE functional use      OT Treatment/Interventions: Self-care/ADL training;Therapeutic exercise;Energy conservation;DME and/or AE instruction;Therapeutic activities;Patient/family education;Balance training    OT Goals(Current goals can be found in the care plan section) Acute Rehab OT Goals Patient Stated Goal: to regain independence OT Goal Formulation: With patient Time For Goal Achievement: 12/26/21 Potential to Achieve Goals: Good ADL Goals Pt Will Perform Lower Body Bathing: with mod assist;sit to/from stand Pt Will Perform Lower Body Dressing: with mod assist;with adaptive equipment;sitting/lateral leans Pt Will Transfer to Toilet: with mod assist;stand pivot transfer;bedside commode  OT Frequency: Min 2X/week       AM-PAC OT "6 Clicks" Daily Activity     Outcome Measure Help from another person eating meals?: None Help from another person taking care of personal grooming?: A Lot Help from another person toileting, which includes using toliet, bedpan, or urinal?: A Lot Help from  another person bathing (including washing, rinsing, drying)?: A Lot Help from another person to put on and taking off regular upper body clothing?: A Little Help from another person to put on and taking off regular lower body clothing?: A Lot 6 Click Score: 15   End of Session Equipment Utilized During Treatment: Gait belt;Rolling walker (2 wheels) Nurse Communication: Mobility status  Activity Tolerance: Patient tolerated treatment  well Patient left: in bed;with call bell/phone within reach;with chair alarm set;Other (comment) (with cardiology MD present)  OT Visit Diagnosis: Muscle weakness (generalized) (M62.81);Unsteadiness on feet (R26.81)                Time: 0413-6438 OT Time Calculation (min): 32 min Charges:  OT General Charges $OT Visit: 1 Visit OT Evaluation $OT Eval Moderate Complexity: 1 Mod OT Treatments $Self Care/Home Management : 8-22 mins  Fredirick Maudlin, OTR/L [place]

## 2021-12-12 NOTE — Plan of Care (Signed)
°  Problem: Education: °Goal: Ability to demonstrate management of disease process will improve °Outcome: Progressing °Goal: Ability to verbalize understanding of medication therapies will improve °Outcome: Progressing °Goal: Individualized Educational Video(s) °Outcome: Progressing °  °Problem: Activity: °Goal: Capacity to carry out activities will improve °Outcome: Progressing °  °Problem: Cardiac: °Goal: Ability to achieve and maintain adequate cardiopulmonary perfusion will improve °Outcome: Progressing °  °Problem: Activity: °Goal: Ability to tolerate increased activity will improve °Outcome: Progressing °  °Problem: Clinical Measurements: °Goal: Ability to maintain a body temperature in the normal range will improve °Outcome: Progressing °  °Problem: Respiratory: °Goal: Ability to maintain adequate ventilation will improve °Outcome: Progressing °Goal: Ability to maintain a clear airway will improve °Outcome: Progressing °  °Problem: Education: °Goal: Knowledge of General Education information will improve °Description: Including pain rating scale, medication(s)/side effects and non-pharmacologic comfort measures °Outcome: Progressing °  °Problem: Health Behavior/Discharge Planning: °Goal: Ability to manage health-related needs will improve °Outcome: Progressing °  °Problem: Clinical Measurements: °Goal: Ability to maintain clinical measurements within normal limits will improve °Outcome: Progressing °Goal: Will remain free from infection °Outcome: Progressing °Goal: Diagnostic test results will improve °Outcome: Progressing °Goal: Respiratory complications will improve °Outcome: Progressing °Goal: Cardiovascular complication will be avoided °Outcome: Progressing °  °Problem: Activity: °Goal: Risk for activity intolerance will decrease °Outcome: Progressing °  °Problem: Nutrition: °Goal: Adequate nutrition will be maintained °Outcome: Progressing °  °Problem: Coping: °Goal: Level of anxiety will  decrease °Outcome: Progressing °  °Problem: Elimination: °Goal: Will not experience complications related to bowel motility °Outcome: Progressing °Goal: Will not experience complications related to urinary retention °Outcome: Progressing °  °Problem: Pain Managment: °Goal: General experience of comfort will improve °Outcome: Progressing °  °Problem: Safety: °Goal: Ability to remain free from injury will improve °Outcome: Progressing °  °Problem: Skin Integrity: °Goal: Risk for impaired skin integrity will decrease °Outcome: Progressing °  °

## 2021-12-12 NOTE — Progress Notes (Signed)
PROGRESS NOTE    [name]  IRC:[id number] DOB: December 07, 1933 DOA: 12/11/2021 PCP: [name], MD    Brief Narrative:  [name] is a 88 y.o. male with medical history significant for anxiety, asthma, CAD, CHF, CKD, who presented to the ER with acute onset of generalized weakness and fatigue.  He admitted to recent dry cough and stated that he has been recently treated for pneumonia few weeks ago.  Chest x-ray showed evidence of pulmonary edema, questionable pneumonia.  Patient was giving IV Lasix, antibiotics. 1/15.  Pneumonia ruled out, continue IV Lasix.  Assessment & Plan:   Principal Problem:   CAP (community acquired pneumonia) Active Problems:   Generalized weakness   Acute heart failure with preserved ejection fraction (HCC)   Atrial fibrillation (HCC)   Iron deficiency anemia   Acute on chronic diastolic congestive heart failure. Patient cardiology consult. Continue IV furosemide for now. Patient does not have hypoxia. Repeated procalcitonin level still low, patient does not have pneumonia.  Discontinue antibiotics.  Urine culture has multiple species, no UTI.   Generalized weakness. Iron deficient anemia. Due to drop of the hemoglobin, cardiology has discontinued Eliquis. Continue iron orally.  Acute kidney injury. Initial creatinine was 1.41, renal function normalized after giving IV Lasix. Potassium 3.5, will give a dose of oral potassium.  Paroxysmal atrial fibrillation. Cardiology has discontinued Eliquis due to concern of fall risk and a drop in hemoglobin.  Indwelling Foley catheter.     DVT prophylaxis: Eliquis Code Status: full Family Communication:  Disposition Plan: Anticipating discharge to nursing home due to significant weakness.     Status is: Inpatient   Remains inpatient appropriate because: Severity of disease, IV Lasix.   I/O last 3 completed shifts: In: 350 [IV Piggyback:350] Out: 1800 [Urine:1800] Total I/O In: -   Out: 80 [Urine:950]     Consultants:  Cardiology.  Procedures: None  Antimicrobials: None  Subjective: Patient still has significant weakness, very difficult even to sit up. No short of breath, no hypoxia.  No cough. Denies any abdominal pain or nausea vomiting. No dysuria hematuria.  Objective: Vitals:   12/12/21 0500 12/12/21 0601 12/12/21 0854 12/12/21 1140  BP:  (!) 127/49 (!) 112/42 (!) 125/53  Pulse:  78 80 83  Resp:  18 16   Temp:  98.7 F (37.1 C) 97.7 F (36.5 C) 97.8 F (36.6 C)  TempSrc:      SpO2:  90% 91% 97%  Weight: 93.6 kg     Height:        Intake/Output Summary (Last 24 hours) at 12/12/2021 1211 Last data filed at 12/12/2021 0706 Gross per 24 hour  Intake 350 ml  Output 2600 ml  Net -2250 ml   Filed Weights   12/10/21 1641 12/12/21 0500  Weight: 90.7 kg 93.6 kg    Examination:  General exam: Appears calm and comfortable  Respiratory system: Clear to auscultation. Respiratory effort normal. Cardiovascular system: S1 & S2 heard, RRR. 2/6 SM at RUSB.  Trace pedal edema. Gastrointestinal system: Abdomen is nondistended, soft and nontender. No organomegaly or masses felt. Normal bowel sounds heard. Central nervous system: Alert and oriented. No focal neurological deficits. Extremities: Symmetric 5 x 5 power. Skin: No rashes, lesions or ulcers Psychiatry: Judgement and insight appear normal. Mood & affect appropriate.     Data Reviewed: I have personally reviewed following labs and imaging studies  CBC: Recent Labs  Lab 12/10/21 1639 12/11/21 0547 12/12/21 0451  WBC 6.2 4.7 4.1  NEUTROABS 4.9  --  2.9  HGB 9.2* 7.4* 7.8*  HCT 28.8* 23.0* 24.2*  MCV 102.1* 100.9* 99.2  PLT 209 194 630   Basic Metabolic Panel: Recent Labs  Lab 12/10/21 1639 12/11/21 0547 12/12/21 0451  NA 141 136 139  K 4.0 3.6 3.5  CL 99 98 99  CO2 33* 31 32  GLUCOSE 95 103* 98  BUN 28* 24* 20  CREATININE 1.41* 1.16 1.00  CALCIUM 8.4* 7.7* 7.9*  MG  --    --  2.0   GFR: Estimated Creatinine Clearance: 61 mL/min (by C-G formula based on SCr of 1 mg/dL). Liver Function Tests: Recent Labs  Lab 12/10/21 1639  AST 19  ALT 11  ALKPHOS 56  BILITOT 0.6  PROT 6.2*  ALBUMIN 3.2*   No results for input(s): LIPASE, AMYLASE in the last 168 hours. No results for input(s): AMMONIA in the last 168 hours. Coagulation Profile: No results for input(s): INR, PROTIME in the last 168 hours. Cardiac Enzymes: No results for input(s): CKTOTAL, CKMB, CKMBINDEX, TROPONINI in the last 168 hours. BNP (last 3 results) No results for input(s): PROBNP in the last 8760 hours. HbA1C: No results for input(s): HGBA1C in the last 72 hours. CBG: No results for input(s): GLUCAP in the last 168 hours. Lipid Profile: No results for input(s): CHOL, HDL, LDLCALC, TRIG, CHOLHDL, LDLDIRECT in the last 72 hours. Thyroid Function Tests: No results for input(s): TSH, T4TOTAL, FREET4, T3FREE, THYROIDAB in the last 72 hours. Anemia Panel: Recent Labs    12/11/21 0547 12/11/21 1522  VITAMINB12  --  1,514*  FERRITIN 114  --   TIBC 168*  --   IRON 29*  --    Sepsis Labs: Recent Labs  Lab 12/10/21 1647 12/11/21 0300 12/11/21 0547 12/12/21 0451  PROCALCITON 0.33  --  0.19 0.17  LATICACIDVEN  --  1.0  --   --     Recent Results (from the past 240 hour(s))  Urine Culture     Status: Abnormal   Collection Time: 12/10/21  9:30 PM   Specimen: Urine, Random  Result Value Ref Range Status   Specimen Description   Final    URINE, RANDOM Performed at [facility], [address] [place], [place] [address]    Special Requests   Final    NONE Performed at [facility], [address] [place], [place] [address]    Culture MULTIPLE SPECIES PRESENT, SUGGEST RECOLLECTION (A)  Final   Report Status 12/12/2021 FINAL  Final  Resp Panel by RT-PCR (Flu A&B, Covid) Nasopharyngeal Swab     Status: None   Collection Time: 12/11/21  3:00 AM   Specimen:  Nasopharyngeal Swab; Nasopharyngeal(NP) swabs in vial transport medium  Result Value Ref Range Status   SARS Coronavirus 2 by RT PCR NEGATIVE NEGATIVE Final    Comment: (NOTE) SARS-CoV-2 target nucleic acids are NOT DETECTED.  The SARS-CoV-2 RNA is generally detectable in upper respiratory specimens during the acute phase of infection. The lowest concentration of SARS-CoV-2 viral copies this assay can detect is 138 copies/mL. A negative result does not preclude SARS-Cov-2 infection and should not be used as the sole basis for treatment or other patient management decisions. A negative result may occur with  improper specimen collection/handling, submission of specimen other than nasopharyngeal swab, presence of viral mutation(s) within the areas targeted by this assay, and inadequate number of viral copies(<138 copies/mL). A negative result must be combined with clinical observations, patient history, and  epidemiological information. The expected result is Negative.  Fact Sheet for Patients:  EntrepreneurPulse.com.au  Fact Sheet for Healthcare Providers:  IncredibleEmployment.be  This test is no t yet approved or cleared by the [place] FDA and  has been authorized for detection and/or diagnosis of SARS-CoV-2 by FDA under an Emergency Use Authorization (EUA). This EUA will remain  in effect (meaning this test can be used) for the duration of the COVID-19 declaration under Section 564(b)(1) of the Act, 21 U.S.C.section 360bbb-3(b)(1), unless the authorization is terminated  or revoked sooner.       Influenza A by PCR NEGATIVE NEGATIVE Final   Influenza B by PCR NEGATIVE NEGATIVE Final    Comment: (NOTE) The Xpert Xpress SARS-CoV-2/FLU/RSV plus assay is intended as an aid in the diagnosis of influenza from Nasopharyngeal swab specimens and should not be used as a sole basis for treatment. Nasal washings and aspirates are unacceptable for  Xpert Xpress SARS-CoV-2/FLU/RSV testing.  Fact Sheet for Patients: EntrepreneurPulse.com.au  Fact Sheet for Healthcare Providers: IncredibleEmployment.be  This test is not yet approved or cleared by the [place] FDA and has been authorized for detection and/or diagnosis of SARS-CoV-2 by FDA under an Emergency Use Authorization (EUA). This EUA will remain in effect (meaning this test can be used) for the duration of the COVID-19 declaration under Section 564(b)(1) of the Act, 21 U.S.C. section 360bbb-3(b)(1), unless the authorization is terminated or revoked.  Performed at [facility], [address] [place], [place] [address]   Culture, blood (Routine X 2) w Reflex to ID Panel     Status: None (Preliminary result)   Collection Time: 12/11/21  3:00 AM   Specimen: BLOOD  Result Value Ref Range Status   Specimen Description BLOOD BLOOD LEFT FOREARM  Final   Special Requests   Final    BOTTLES DRAWN AEROBIC AND ANAEROBIC Blood Culture results may not be optimal due to an inadequate volume of blood received in culture bottles   Culture   Final    NO GROWTH 1 DAY Performed at [facility], [address] [place], [place] [address]    Report Status PENDING  Incomplete  Culture, blood (Routine X 2) w Reflex to ID Panel     Status: None (Preliminary result)   Collection Time: 12/11/21  3:00 AM   Specimen: BLOOD  Result Value Ref Range Status   Specimen Description BLOOD LEFT ANTECUBITAL  Final   Special Requests   Final    BOTTLES DRAWN AEROBIC AND ANAEROBIC Blood Culture results may not be optimal due to an inadequate volume of blood received in culture bottles   Culture   Final    NO GROWTH 1 DAY Performed at [facility], [address] [place], [place] [address]    Report Status PENDING  Incomplete         Radiology Studies: DG Chest 1 View  Result Date: 12/10/2021 CLINICAL DATA:  weakness EXAM:  CHEST  1 VIEW COMPARISON:  Chest x-ray 11/16/2021, CT chest 05/30/2018 FINDINGS: The heart and mediastinal contours are unchanged. Aortic calcification. Prominent hilar vasculature. Azygous fissure. Left basilar streaky airspace opacities. Question developing airspace opacity within the lingula. No focal consolidation. Increased interstitial markings. No pleural effusion. No pneumothorax. No acute osseous abnormality. IMPRESSION: 1. Pulmonary edema. 2. Question developing airspace opacity within the lingula may represent infection/inflammation. 3. Left basilar streaky airspace opacity may represent atelectasis versus infection/inflammation. 4.  Aortic Atherosclerosis (ICD10-I70.0). Electronically Signed   By: Thomasena Edis  Mckinley Jewel M.D.   On: 12/10/2021 17:21   CT Head Wo Contrast  Result Date: 12/10/2021 CLINICAL DATA:  Mental status change, unknown cause, generalized weakness EXAM: CT HEAD WITHOUT CONTRAST TECHNIQUE: Contiguous axial images were obtained from the base of the skull through the vertex without intravenous contrast. RADIATION DOSE REDUCTION: This exam was performed according to the departmental dose-optimization program which includes automated exposure control, adjustment of the mA and/or kV according to patient size and/or use of iterative reconstruction technique. COMPARISON:  None. BRAIN: BRAIN Cerebral ventricle sizes are concordant with the degree of cerebral volume loss. Patchy and confluent areas of decreased attenuation are noted throughout the deep and periventricular white matter of the cerebral hemispheres bilaterally, compatible with chronic microvascular ischemic disease. No evidence of large-territorial acute infarction. No parenchymal hemorrhage. No mass lesion. No extra-axial collection. No mass effect or midline shift. No hydrocephalus. Basilar cisterns are patent. Vascular: No hyperdense vessel. Atherosclerotic calcifications are present within the cavernous internal carotid arteries.  Skull: No acute fracture or focal lesion. Sinuses/Orbits: Bilateral maxillary and sphenoid mucosal thickening. Mild mucosal thickening of the ethmoid sinuses. Remaining visualized paranasal sinuses and mastoid air cells are clear. The orbits are unremarkable. Other: None. IMPRESSION: No acute intracranial abnormality. Electronically Signed   By: Iven Finn M.D.   On: 12/10/2021 18:33        Scheduled Meds:  acidophilus  1 capsule Oral Daily   ALPRAZolam  0.5 mg Oral QHS   vitamin C  1,000 mg Oral Daily   fluconazole  100 mg Oral Daily   fluticasone  2 spray Each Nare BID   folic acid  0,347 mcg Oral Daily   furosemide  40 mg Intravenous Q12H   gabapentin  600 mg Oral BID AC & HS   guaiFENesin  600 mg Oral BID   hydroxychloroquine  200 mg Oral BID   iron polysaccharides  150 mg Oral Daily   loratadine  10 mg Oral Daily   [START ON 12/15/2021] methotrexate  25 mg Oral Weekly   metoprolol succinate  12.5 mg Oral BID   mirabegron ER  50 mg Oral Daily   multivitamin with minerals  1 tablet Oral Daily   pantoprazole  40 mg Oral Daily   polyethylene glycol  17 g Oral QHS   pravastatin  20 mg Oral q1800   traZODone  50 mg Oral QHS   Continuous Infusions:   LOS: 1 day    Time spent: 28 minutes    Sharen Hones, MD [facility]   To contact the attending provider between 7A-7P or the covering provider during after hours 7P-7A, please log into the web site [email or web address] and access using universal [place] password for that web site. If you do not have the password, please call the hospital operator.  12/12/2021, 12:11 PM

## 2021-12-12 NOTE — Progress Notes (Signed)
Progress Note  Patient Name: [name] Date of Encounter: 12/12/2021  [facility] Cardiologist: Kathlyn Sacramento, MD   Subjective   Chest work with physical therapy, edema is improving.  No acute events overnight.  Hemoglobin 7.8 this morning.  Net -1.4 L  Inpatient Medications    Scheduled Meds:  acidophilus  1 capsule Oral Daily   ALPRAZolam  0.5 mg Oral QHS   vitamin C  1,000 mg Oral Daily   fluconazole  100 mg Oral Daily   fluticasone  2 spray Each Nare BID   folic acid  1,610 mcg Oral Daily   furosemide  40 mg Intravenous Q12H   gabapentin  600 mg Oral BID AC & HS   guaiFENesin  600 mg Oral BID   hydroxychloroquine  200 mg Oral BID   iron polysaccharides  150 mg Oral Daily   loratadine  10 mg Oral Daily   [START ON 12/15/2021] methotrexate  25 mg Oral Weekly   metoprolol succinate  12.5 mg Oral BID   mirabegron ER  50 mg Oral Daily   multivitamin with minerals  1 tablet Oral Daily   pantoprazole  40 mg Oral Daily   polyethylene glycol  17 g Oral QHS   pravastatin  20 mg Oral q1800   traZODone  50 mg Oral QHS   Continuous Infusions:  PRN Meds: acetaminophen **OR** acetaminophen, albuterol, EPINEPHrine, magnesium hydroxide, ondansetron **OR** ondansetron (ZOFRAN) IV, traZODone   Vital Signs    Vitals:   12/12/21 0500 12/12/21 0601 12/12/21 0854 12/12/21 1140  BP:  (!) 127/49 (!) 112/42 (!) 125/53  Pulse:  78 80 83  Resp:  18 16   Temp:  98.7 F (37.1 C) 97.7 F (36.5 C) 97.8 F (36.6 C)  TempSrc:      SpO2:  90% 91% 97%  Weight: 93.6 kg     Height:        Intake/Output Summary (Last 24 hours) at 12/12/2021 1330 Last data filed at 12/12/2021 0706 Gross per 24 hour  Intake 350 ml  Output 2600 ml  Net -2250 ml   Last 3 Weights 12/12/2021 12/10/2021 12/07/2021  Weight (lbs) 206 lb 5.6 oz 200 lb (No Data)  Weight (kg) 93.6 kg 90.719 kg (No Data)  Some encounter information is confidential and restricted. Go to Review Flowsheets activity to see all  data.      Telemetry    Currently not on telemetry- Personally Reviewed  ECG     - Personally Reviewed  Physical Exam   GEN: No acute distress.  Soft-spoken, Neck: No JVD Cardiac: RRR, 2/6 systolic murmur Respiratory: Diminished breath sounds at bases GI: Soft, nontender, non-distended  MS: 1+ edema; No deformity. Neuro:  Nonfocal  Psych: Normal affect   Labs    High Sensitivity Troponin:   Recent Labs  Lab 12/10/21 1639 12/11/21 0300  TROPONINIHS 32* 22*     Chemistry Recent Labs  Lab 12/10/21 1639 12/11/21 0547 12/12/21 0451  NA 141 136 139  K 4.0 3.6 3.5  CL 99 98 99  CO2 33* 31 32  GLUCOSE 95 103* 98  BUN 28* 24* 20  CREATININE 1.41* 1.16 1.00  CALCIUM 8.4* 7.7* 7.9*  MG  --   --  2.0  PROT 6.2*  --   --   ALBUMIN 3.2*  --   --   AST 19  --   --   ALT 11  --   --   ALKPHOS 56  --   --  BILITOT 0.6  --   --   GFRNONAA 49* >60 >60  ANIONGAP 9 7 8     Lipids No results for input(s): CHOL, TRIG, HDL, LABVLDL, LDLCALC, CHOLHDL in the last 168 hours.  Hematology Recent Labs  Lab 12/10/21 1639 12/11/21 0547 12/12/21 0451  WBC 6.2 4.7 4.1  RBC 2.82* 2.28* 2.44*  HGB 9.2* 7.4* 7.8*  HCT 28.8* 23.0* 24.2*  MCV 102.1* 100.9* 99.2  MCH 32.6 32.5 32.0  MCHC 31.9 32.2 32.2  RDW 15.9* 15.9* 15.6*  PLT 209 194 209   Thyroid No results for input(s): TSH, FREET4 in the last 168 hours.  BNP Recent Labs  Lab 12/10/21 1647  BNP 585.0*    DDimer No results for input(s): DDIMER in the last 168 hours.   Radiology    DG Chest 1 View  Result Date: 12/10/2021 CLINICAL DATA:  weakness EXAM: CHEST  1 VIEW COMPARISON:  Chest x-ray 11/16/2021, CT chest 05/30/2018 FINDINGS: The heart and mediastinal contours are unchanged. Aortic calcification. Prominent hilar vasculature. Azygous fissure. Left basilar streaky airspace opacities. Question developing airspace opacity within the lingula. No focal consolidation. Increased interstitial markings. No pleural  effusion. No pneumothorax. No acute osseous abnormality. IMPRESSION: 1. Pulmonary edema. 2. Question developing airspace opacity within the lingula may represent infection/inflammation. 3. Left basilar streaky airspace opacity may represent atelectasis versus infection/inflammation. 4.  Aortic Atherosclerosis (ICD10-I70.0). Electronically Signed   By: Iven Finn M.D.   On: 12/10/2021 17:21   CT Head Wo Contrast  Result Date: 12/10/2021 CLINICAL DATA:  Mental status change, unknown cause, generalized weakness EXAM: CT HEAD WITHOUT CONTRAST TECHNIQUE: Contiguous axial images were obtained from the base of the skull through the vertex without intravenous contrast. RADIATION DOSE REDUCTION: This exam was performed according to the departmental dose-optimization program which includes automated exposure control, adjustment of the mA and/or kV according to patient size and/or use of iterative reconstruction technique. COMPARISON:  None. BRAIN: BRAIN Cerebral ventricle sizes are concordant with the degree of cerebral volume loss. Patchy and confluent areas of decreased attenuation are noted throughout the deep and periventricular white matter of the cerebral hemispheres bilaterally, compatible with chronic microvascular ischemic disease. No evidence of large-territorial acute infarction. No parenchymal hemorrhage. No mass lesion. No extra-axial collection. No mass effect or midline shift. No hydrocephalus. Basilar cisterns are patent. Vascular: No hyperdense vessel. Atherosclerotic calcifications are present within the cavernous internal carotid arteries. Skull: No acute fracture or focal lesion. Sinuses/Orbits: Bilateral maxillary and sphenoid mucosal thickening. Mild mucosal thickening of the ethmoid sinuses. Remaining visualized paranasal sinuses and mastoid air cells are clear. The orbits are unremarkable. Other: None. IMPRESSION: No acute intracranial abnormality. Electronically Signed   By: Iven Finn  M.D.   On: 12/10/2021 18:33   ECHOCARDIOGRAM COMPLETE  Result Date: 12/12/2021    ECHOCARDIOGRAM REPORT   Patient Name:   [name] Date of Exam: 12/12/2021 Medical Rec #:  [id number]    Height:       69.0 in Accession #:    [id number]   Weight:       206.3 lb Date of Birth:  1934/08/29    BSA:          2.094 m Patient Age:    88 years     BP:           112/42 mmHg Patient Gender: M            HR:  82 bpm. Exam Location:  [facility] Procedure: 2D Echo Indications:     Dyspnea R06.00  History:         Patient has prior history of Echocardiogram examinations. CHF,                  CAD, Aortic Valve Disease, Arrythmias:Atrial Fibrillation; Risk                  Factors:Hypertension.  Sonographer:     Wallace Keller Thornton-Maynard Referring Phys:  [id number] Kate Sable Diagnosing Phys: Kate Sable MD IMPRESSIONS  1. Left ventricular ejection fraction, by estimation, is 60 to 65%. The left ventricle has normal function. The left ventricle has no regional wall motion abnormalities. There is mild left ventricular hypertrophy. Left ventricular diastolic parameters are consistent with Grade I diastolic dysfunction (impaired relaxation).  2. Right ventricular systolic function is low normal. The right ventricular size is normal. There is normal pulmonary artery systolic pressure.  3. The mitral valve is grossly normal. Mild mitral valve regurgitation.  4. The aortic valve is calcified. Aortic valve regurgitation is trivial. Moderate aortic valve stenosis. Aortic valve area, by VTI measures 1.32 cm. Aortic valve mean gradient measures 23.0 mmHg. Aortic valve Vmax measures 3.12 m/s.  5. Aortic dilatation noted. There is borderline dilatation of the aortic root, measuring 38 mm. FINDINGS  Left Ventricle: Left ventricular ejection fraction, by estimation, is 60 to 65%. The left ventricle has normal function. The left ventricle has no regional wall motion abnormalities. The left ventricular internal cavity size was  normal in size. There is  mild left ventricular hypertrophy. Left ventricular diastolic parameters are consistent with Grade I diastolic dysfunction (impaired relaxation). Right Ventricle: The right ventricular size is normal. No increase in right ventricular wall thickness. Right ventricular systolic function is low normal. There is normal pulmonary artery systolic pressure. The tricuspid regurgitant velocity is 2.85 m/s,  and with an assumed right atrial pressure of 3 mmHg, the estimated right ventricular systolic pressure is 94.7 mmHg. Left Atrium: Left atrial size was normal in size. Right Atrium: Right atrial size was normal in size. Pericardium: There is no evidence of pericardial effusion. Mitral Valve: The mitral valve is grossly normal. Mild mitral valve regurgitation. Tricuspid Valve: The tricuspid valve is not well visualized. Tricuspid valve regurgitation is not demonstrated. Aortic Valve: The aortic valve is calcified. Aortic valve regurgitation is trivial. Moderate aortic stenosis is present. Aortic valve mean gradient measures 23.0 mmHg. Aortic valve peak gradient measures 38.9 mmHg. Aortic valve area, by VTI measures 1.32  cm. Pulmonic Valve: The pulmonic valve was not well visualized. Pulmonic valve regurgitation is not visualized. Aorta: Aortic dilatation noted. There is borderline dilatation of the aortic root, measuring 38 mm. Venous: The inferior vena cava was not well visualized. IAS/Shunts: No atrial level shunt detected by color flow Doppler.  LEFT VENTRICLE PLAX 2D LVIDd:         4.50 cm     Diastology LVIDs:         3.00 cm     LV e' medial:    5.66 cm/s LV PW:         1.20 cm     LV E/e' medial:  13.0 LV IVS:        1.30 cm     LV e' lateral:   9.36 cm/s LVOT diam:     2.50 cm     LV E/e' lateral: 7.8 LV SV:  82 LV SV Index:   39 LVOT Area:     4.91 cm  LV Volumes (MOD) LV vol d, MOD A2C: 39.3 ml LV vol d, MOD A4C: 69.4 ml LV vol s, MOD A2C: 10.0 ml LV vol s, MOD A4C: 15.6 ml LV  SV MOD A2C:     29.3 ml LV SV MOD A4C:     69.4 ml LV SV MOD BP:      41.0 ml RIGHT VENTRICLE RV S prime:     13.30 cm/s TAPSE (M-mode): 2.0 cm LEFT ATRIUM             Index LA diam:        3.70 cm 1.77 cm/m LA Vol (A2C):   60.8 ml 29.04 ml/m LA Vol (A4C):   68.3 ml 32.62 ml/m LA Biplane Vol: 65.3 ml 31.19 ml/m  AORTIC VALVE                     PULMONIC VALVE AV Area (Vmax):    1.38 cm      PV Vmax:       0.76 m/s AV Area (Vmean):   1.48 cm      PV Peak grad:  2.3 mmHg AV Area (VTI):     1.32 cm AV Vmax:           312.00 cm/s AV Vmean:          226.000 cm/s AV VTI:            0.620 m AV Peak Grad:      38.9 mmHg AV Mean Grad:      23.0 mmHg LVOT Vmax:         87.50 cm/s LVOT Vmean:        68.000 cm/s LVOT VTI:          0.167 m LVOT/AV VTI ratio: 0.27  AORTA Ao Root diam: 3.80 cm Ao Asc diam:  3.30 cm MITRAL VALVE               TRICUSPID VALVE MV Area (PHT): 3.17 cm    TR Peak grad:   32.5 mmHg MV Decel Time: 239 msec    TR Vmax:        285.00 cm/s MV E velocity: 73.30 cm/s MV A velocity: 94.30 cm/s  SHUNTS MV E/A ratio:  0.78        Systemic VTI:  0.17 m MV A Prime:    11.7 cm/s   Systemic Diam: 2.50 cm Kate Sable MD Electronically signed by Kate Sable MD Signature Date/Time: 12/12/2021/12:54:42 PM    Final     Cardiac Studies   TTE 12/12/2021 1. Left ventricular ejection fraction, by estimation, is 60 to 65%. The  left ventricle has normal function. The left ventricle has no regional  wall motion abnormalities. There is mild left ventricular hypertrophy.  Left ventricular diastolic parameters  are consistent with Grade I diastolic dysfunction (impaired relaxation).   2. Right ventricular systolic function is low normal. The right  ventricular size is normal. There is normal pulmonary artery systolic  pressure.   3. The mitral valve is grossly normal. Mild mitral valve regurgitation.   4. The aortic valve is calcified. Aortic valve regurgitation is trivial.  Moderate aortic valve  stenosis. Aortic valve area, by VTI measures 1.32  cm. Aortic valve mean gradient measures 23.0 mmHg. Aortic valve Vmax  measures 3.12 m/s.   5. Aortic dilatation noted. There is borderline dilatation of the  aortic  root, measuring 38 mm.   Patient Profile     88 y.o. male with history of HFpEF, atrial fibrillation on Eliquis, aortic stenosis, hyperlipidemia, prostate cancer s/p prostatectomy, chronic indwelling Foley catheter who presents due to weakness and fatigue, diagnosed with UTI and pneumonia.  Being seen for CHF.  Assessment & Plan    HFpEF, leg edema -Still volume overloaded, edema improved -Continue Lasix IV 40 mg twice daily -Net -1.4 L.  Creatinine normal.  2.  Atrial fibrillation -Maintaining sinus rhythm, continue Toprol-XL 12.5 mg daily -Holding Eliquis due to fall risk, anemia.  3.  Weakness, fatigue -Deconditioning, anemia, UTI/pneumonia-contributing -Work-up of anemia as per primary team -Treatment of UTI/pneumonia as per medicine team  4.  Anemia -Maintain hemoglobin over 8 -Work-up per medicine  Total encounter time more than 35 minutes  Greater than 50% was spent in counseling and coordination of care with the patient       Signed, Kate Sable, MD  12/12/2021, 1:30 PM

## 2021-12-13 DIAGNOSIS — I480 Paroxysmal atrial fibrillation: Secondary | ICD-10-CM

## 2021-12-13 DIAGNOSIS — I4821 Permanent atrial fibrillation: Secondary | ICD-10-CM

## 2021-12-13 DIAGNOSIS — I5033 Acute on chronic diastolic (congestive) heart failure: Secondary | ICD-10-CM

## 2021-12-13 LAB — CBC WITH DIFFERENTIAL/PLATELET
Abs Immature Granulocytes: 0.07 10*3/uL (ref 0.00–0.07)
Basophils Absolute: 0.1 10*3/uL (ref 0.0–0.1)
Basophils Relative: 1 %
Eosinophils Absolute: 0.2 10*3/uL (ref 0.0–0.5)
Eosinophils Relative: 6 %
HCT: 22.7 % — ABNORMAL LOW (ref 39.0–52.0)
Hemoglobin: 7.4 g/dL — ABNORMAL LOW (ref 13.0–17.0)
Immature Granulocytes: 2 %
Lymphocytes Relative: 21 %
Lymphs Abs: 0.9 10*3/uL (ref 0.7–4.0)
MCH: 31.9 pg (ref 26.0–34.0)
MCHC: 32.6 g/dL (ref 30.0–36.0)
MCV: 97.8 fL (ref 80.0–100.0)
Monocytes Absolute: 0.2 10*3/uL (ref 0.1–1.0)
Monocytes Relative: 4 %
Neutro Abs: 2.8 10*3/uL (ref 1.7–7.7)
Neutrophils Relative %: 66 %
Platelets: 229 10*3/uL (ref 150–400)
RBC: 2.32 MIL/uL — ABNORMAL LOW (ref 4.22–5.81)
RDW: 15.5 % (ref 11.5–15.5)
WBC: 4.1 10*3/uL (ref 4.0–10.5)
nRBC: 0 % (ref 0.0–0.2)

## 2021-12-13 LAB — BASIC METABOLIC PANEL WITH GFR
Anion gap: 8 (ref 5–15)
BUN: 19 mg/dL (ref 8–23)
CO2: 33 mmol/L — ABNORMAL HIGH (ref 22–32)
Calcium: 8 mg/dL — ABNORMAL LOW (ref 8.9–10.3)
Chloride: 97 mmol/L — ABNORMAL LOW (ref 98–111)
Creatinine, Ser: 1.2 mg/dL (ref 0.61–1.24)
GFR, Estimated: 59 mL/min — ABNORMAL LOW
Glucose, Bld: 102 mg/dL — ABNORMAL HIGH (ref 70–99)
Potassium: 3.6 mmol/L (ref 3.5–5.1)
Sodium: 138 mmol/L (ref 135–145)

## 2021-12-13 LAB — MAGNESIUM: Magnesium: 1.9 mg/dL (ref 1.7–2.4)

## 2021-12-13 MED ORDER — QUETIAPINE FUMARATE 25 MG PO TABS
25.0000 mg | ORAL_TABLET | Freq: Every day | ORAL | Status: DC
  Administered 2021-12-13 – 2021-12-15 (×3): 25 mg via ORAL
  Filled 2021-12-13 (×3): qty 1

## 2021-12-13 MED ORDER — FUROSEMIDE 40 MG PO TABS
40.0000 mg | ORAL_TABLET | Freq: Every day | ORAL | Status: DC
  Administered 2021-12-14 – 2021-12-15 (×2): 40 mg via ORAL
  Filled 2021-12-13 (×3): qty 1

## 2021-12-13 NOTE — NC FL2 (Signed)
[place] LEVEL OF CARE SCREENING TOOL     IDENTIFICATION  Patient Name: [name] Birthdate: [birth year] Sex: male Admission Date (Current Location): 12/11/2021  [facility] and [place] Number:  Engineering geologist and Address:  [facility], [address], [place], [place] [address]      Provider Number: [id number]  Attending Physician Name and Address:  Sharen Hones, MD  Relative Name and Phone Number:  Lilyan Punt [phone]    Current Level of Care: Hospital Recommended Level of Care: [place] Prior Approval Number:    Date Approved/Denied:   PASRR Number: [id number]  Discharge Plan: SNF    Current Diagnoses: Patient Active Problem List   Diagnosis Date Noted   CAP (community acquired pneumonia) 12/11/2021   Acute heart failure with preserved ejection fraction [facility])    Atrial fibrillation ([place])    Iron deficiency anemia    Decubitus ulcer of left buttock, stage 2 ([place]) 12/03/2021   Asthma exacerbation 10/05/2021   PAF (paroxysmal atrial fibrillation) ([place]) 10/05/2021   Sacral decubitus ulcer, stage II ([place]) 10/05/2021   E coli bacteremia    Acute kidney injury superimposed on CKD ([place]) 09/11/2021   Hypotension    Thrombocytopenia ([place])    Chronic diastolic CHF (congestive heart failure) ([place])    Generalized weakness    Neuropathy    Severe persistent asthma 02/05/2021   Spondylosis without myelopathy or radiculopathy, lumbosacral region 11/24/2020   Elevated C-reactive protein (CRP) 11/24/2020   Elevated sed rate 11/24/2020   Chronic pain syndrome 11/04/2020   Disorder of skeletal system 11/04/2020   Physical deconditioning 11/04/2020   Abnormal MRI, lumbar spine (09/21/2020) 11/04/2020   Chronic low back pain (Bilateral) w/o sciatica 11/04/2020   DDD (degenerative disc disease), lumbosacral 11/04/2020   Lumbosacral foraminal stenosis 11/04/2020   Lumbar facet hypertrophy 11/04/2020   Levoscoliosis  of lumbosacral spine 11/04/2020   Grade 1 Retrolisthesis at L2-3 and L3-4 11/04/2020   Chronic anticoagulation (Eliquis) 11/04/2020   Lumbar facet syndrome 11/04/2020   Long term prescription benzodiazepine use 11/04/2020   Chronic shoulder pain (Bilateral) 11/04/2020   Chronic elbow pain (Bilateral) 11/04/2020   Chronic wrist pain (Bilateral) 11/04/2020   Chronic sinusitis 07/12/2020   Persistent atrial fibrillation ([place]) 07/12/2020   Pancytopenia ([place]) 09/12/2019   Edema of lower extremity 09/12/2019   Hyposmolality and/or hyponatremia 09/12/2019   Renal cell carcinoma ([place]) 07/11/2019   AF (paroxysmal atrial fibrillation) ([place]) 05/09/2019   History of TIA (transient ischemic attack) 12/06/2018   Vision changes 12/06/2018   TIA (transient ischemic attack) 06/06/2018   Carotid artery disease ([place]) 06/06/2018   Long term current use of immunosuppressive drug 04/24/2018   Obesity (BMI 30-39.9) 04/24/2018   SIADH (syndrome of inappropriate ADH production) ([place]) 04/17/2017   AKI (acute kidney injury) ([place]) 04/03/2017   Hyponatremia 01/16/2017   Urinary tract infection associated with indwelling urethral catheter ([place]) 12/19/2016   Asthma with acute exacerbation 11/14/2016   Mild intermittent asthma without complication 32/95/1884   Mood disorder ([place]) 01/18/2016   Presence of indwelling urinary catheter 08/20/2015   Advanced directives, counseling/discussion 07/28/2014   Routine general medical examination at a health care facility 04/09/2012   Venous stasis dermatitis 06/13/2011   Sleep disturbance 12/21/2010   CONSTIPATION, CHRONIC 12/07/2010   Chronic constipation 12/07/2010   Hyperlipidemia 12/02/2010   OSTEOARTHRITIS 12/02/2010   Neurogenic bladder 05/24/2010   IBS 05/05/2010   History of colonic polyps 05/05/2010   NEUROPATHY 05/03/2010   Mononeuritis  05/03/2010   Personal history of prostate cancer 03/04/2010   Benign essential hypertension 03/25/2009   GAD  (generalized anxiety disorder) 01/27/2007   Coronary atherosclerosis of native coronary artery 01/27/2007   Allergic asthma 01/27/2007   GERD 01/27/2007   Chronic rheumatic arthritis ([place]) 01/27/2007   Rheumatoid arthritis involving multiple sites with positive rheumatoid factor ([place]) 01/27/2007    Orientation RESPIRATION BLADDER Height & Weight     Self, Time, Situation, Place  Normal Continent Weight: 91.2 kg Height:  5\' 9"  (175.3 cm)  BEHAVIORAL SYMPTOMS/MOOD NEUROLOGICAL BOWEL NUTRITION STATUS      Continent Diet (regular)  AMBULATORY STATUS COMMUNICATION OF NEEDS Skin   Extensive Assist Verbally Normal                       Personal Care Assistance Level of Assistance  Bathing, Feeding, Dressing, Total care Bathing Assistance: Limited assistance Feeding assistance: Independent Dressing Assistance: Maximum assistance Total Care Assistance: Maximum assistance   Functional Limitations Info             SPECIAL CARE FACTORS FREQUENCY  PT (By licensed PT), OT (By licensed OT)     PT Frequency: 5 days per week OT Frequency: 5 days per week            Contractures Contractures Info: Not present    Additional Factors Info  Code Status, Allergies Code Status Info: full code Allergies Info: Chlorhexidine, Ciprofloxacin, Citalopram Hydrobromide, Clindamycin, Lorazepam, Paroxetine, Ramipril, Simvastatin, Sulfa Antibiotics           Current Medications (12/13/2021):  This is the current hospital active medication list Current Facility-Administered Medications  Medication Dose Route Frequency Provider Last Rate Last Admin   acetaminophen (TYLENOL) tablet 650 mg  650 mg Oral Q6H PRN Mansy, Jan A, MD   650 mg at 12/13/21 0310   Or   acetaminophen (TYLENOL) suppository 650 mg  650 mg Rectal Q6H PRN Mansy, Jan A, MD       acidophilus (RISAQUAD) capsule 1 capsule  1 capsule Oral Daily Mansy, Jan A, MD   1 capsule at 12/13/21 0842   albuterol (PROVENTIL) (2.5 MG/3ML)  0.083% nebulizer solution 2.5 mg  2.5 mg Nebulization Q6H PRN Renda Rolls, RPH       ALPRAZolam Duanne Moron) tablet 0.5 mg  0.5 mg Oral QHS Mansy, Jan A, MD   0.5 mg at 12/12/21 2309   ascorbic acid (VITAMIN C) tablet 1,000 mg  1,000 mg Oral Daily Mansy, Jan A, MD   1,000 mg at 12/13/21 0842   EPINEPHrine (EPI-PEN) injection 0.3 mg  0.3 mg Intramuscular PRN Mansy, Jan A, MD       fluconazole (DIFLUCAN) tablet 100 mg  100 mg Oral Daily Mansy, Jan A, MD   100 mg at 12/13/21 0842   fluticasone (FLONASE) 50 MCG/ACT nasal spray 2 spray  2 spray Each Nare BID Mansy, Jan A, MD   2 spray at 19/37/90 2409   folic acid (FOLVITE) tablet 1 mg  1,000 mcg Oral Daily Mansy, Jan A, MD   1 mg at 12/13/21 0842   furosemide (LASIX) injection 40 mg  40 mg Intravenous Q12H Sharen Hones, MD   40 mg at 12/13/21 0843   gabapentin (NEURONTIN) tablet 600 mg  600 mg Oral BID AC & HS Mansy, Jan A, MD   600 mg at 12/13/21 0842   guaiFENesin (MUCINEX) 12 hr tablet 600 mg  600 mg Oral BID Mansy, Arvella Merles, MD  600 mg at 12/12/21 0918   hydroxychloroquine (PLAQUENIL) tablet 200 mg  200 mg Oral BID Mansy, Jan A, MD   200 mg at 12/13/21 8676   iron polysaccharides (NIFEREX) capsule 150 mg  150 mg Oral Daily Sharen Hones, MD   150 mg at 12/13/21 0842   loratadine (CLARITIN) tablet 10 mg  10 mg Oral Daily Mansy, Jan A, MD   10 mg at 12/13/21 0842   magnesium hydroxide (MILK OF MAGNESIA) suspension 30 mL  30 mL Oral Daily PRN Mansy, Jan A, MD       [START ON 12/15/2021] methotrexate (RHEUMATREX) tablet 25 mg  25 mg Oral Weekly Mansy, Jan A, MD       metoprolol succinate (TOPROL-XL) 24 hr tablet 12.5 mg  12.5 mg Oral BID Mansy, Jan A, MD   12.5 mg at 12/13/21 0842   mirabegron ER (MYRBETRIQ) tablet 50 mg  50 mg Oral Daily Mansy, Jan A, MD   50 mg at 12/13/21 0842   multivitamin with minerals tablet 1 tablet  1 tablet Oral Daily Mansy, Jan A, MD   1 tablet at 12/13/21 0844   ondansetron (ZOFRAN) tablet 4 mg  4 mg Oral Q6H PRN Mansy, Jan A,  MD       Or   ondansetron [facility]) injection 4 mg  4 mg Intravenous Q6H PRN Mansy, Jan A, MD       pantoprazole (PROTONIX) EC tablet 40 mg  40 mg Oral Daily Mansy, Jan A, MD   40 mg at 12/13/21 1950   polyethylene glycol (MIRALAX / GLYCOLAX) packet 17 g  17 g Oral QHS Mansy, Jan A, MD       pravastatin (PRAVACHOL) tablet 20 mg  20 mg Oral q1800 Mansy, Jan A, MD   20 mg at 12/12/21 1702   QUEtiapine (SEROQUEL) tablet 25 mg  25 mg Oral QHS Sharen Hones, MD         Discharge Medications: Please see discharge summary for a list of discharge medications.  Relevant Imaging Results:  Relevant Lab Results:   Additional Information [id number]  Conception Oms, RN

## 2021-12-13 NOTE — Consult Note (Signed)
° °  Heart Failure Nurse Navigator Note  HfpEF 60 to 65%.  Grade 1 diastolic dysfunction.  Mild LVH.  Right ventricular systolic function low normal.  Moderate aortic stenosis.  He presented to the emergency room with complaints of weakness and dry cough.  BNP noted to be 858.  Chest x-ray revealed pulmonary edema.  Comorbidities:  Anxiety Asthma Coronary artery disease Chronic kidney disease  Medications:  Furosemide 40 mg IV every 12 hours  metoprolol succinate 12.5 mg 2 times a day pravastatin 20 mg daily  Labs:  Sodium 138, potassium 3.6, chloride 97, CO2 33, BUN 19, creatinine 1.2 Weight is 91.2 kg down from 93.6 of yesterday. Blood pressure 118/49 Intake not documented Output 1850 mL   Initial meeting with patient, he resides with wife and daughter they were not at the bedside.  He states that he has heard the term heart failure before and knows that the function of his heart he felt was normal.  Discussed diastolic dysfunction.  He states that his wife and daughter share the responsibility of fixing meals.  He states that he very seldom eats fast food or at a restaurant.  He does use salt at the table but states that his daughter had recently bought him a saltshaker that contained only 2 holes in the top.  Discussed the reasoning behind low-sodium and abstaining from salt, he voices understanding but states there is just something you got to have salt on.  Did not feel that he took in greater than 64 ounce in a days time.  Discussed weighing daily, patient felt that he would be unable to weigh himself felt that it would be a safety risk as he is unsteady on his feet.  He went on to say that he would watch for symptoms of increasing shortness of breath, PND, orthopnea and lower extremity edema along with swelling of the abdomen.  Made aware of the outpatient heart failure clinic and that he has an appointment on January 31 at 11 AM.  He has a 1% no-show rating 4 out of 452  appointments.  Pricilla Riffle RN [facility]

## 2021-12-13 NOTE — TOC Progression Note (Signed)
Transition of Care [facility]) - Progression Note    Patient Details  Name: [name] MRN: [id number] Date of Birth: May 27, 1934  Transition of Care [facility]) CM/SW Malikai Gut, RN Phone Number: 12/13/2021, 4:15 PM  Clinical Narrative:    Received a return call from Rocky Link, provided me the fax number, faxed the referral to [phone]        Expected Discharge Plan and Services                                                 Social Determinants of Health (SDOH) Interventions    Readmission Risk Interventions Readmission Risk Prevention Plan 09/12/2021 06/23/2019  Transportation Screening Complete Complete  PCP or Specialist Appt within 3-5 Days - Complete  HRI or [place] - Complete  Social Work Consult for [place] Planning/Counseling - Complete  Palliative Care Screening - Not Applicable  Medication Review Press photographer) Complete Complete  PCP or Specialist appointment within 3-5 days of discharge Complete -  [place] or Home Care Consult Complete -  SW Recovery Care/Counseling Consult Complete -  Palliative Care Screening Not Applicable -  [place] Complete -  Some recent data might be hidden

## 2021-12-13 NOTE — TOC Progression Note (Signed)
Transition of Care [facility]) - Progression Note    Patient Details  Name: BRAYANT DORR MRN: [id number] Date of Birth: Feb 06, 1934  Transition of Care [facility]) CM/SW Dwight, RN Phone Number: 12/13/2021, 3:02 PM  Clinical Narrative:   Met with the patient and his son in the room to discuss DC planning and needs He has used his bed days and is in his copay days, he spent 37 days in [place] and discharged home Dec 17th. He woul dlike to go to [place] area to go to [facility], is aware he will have daily copay. Sent in the hub to the facilities in that area, Reached out to [employer] [phone] requested to speak to Admissions, Left a secure VM asking for a return call.   Called [facility] [phone] left a secure VM for patricia [facility] at [phone] and left a secure vm for [facility] requesting a call back Adrienne Mocha at [phone]  left a secure vm requesting a call back  Called [facility] at [phone] left a secure Voicemail Called [facility] they do not accept [employer] at this time Called [facility] in [place] spoke with Audry Pili they are ONN with [facility]       Expected Discharge Plan and Services                                                 Social Determinants of Health (SDOH) Interventions    Readmission Risk Interventions Readmission Risk Prevention Plan 09/12/2021 06/23/2019  Transportation Screening Complete Complete  PCP or Specialist Appt within 3-5 Days - Complete  HRI or [place] - Complete  Social Work Consult for [place] Planning/Counseling - Complete  Palliative Care Screening - Not Applicable  Medication Review Press photographer) Complete Complete  PCP or Specialist appointment within 3-5 days of discharge Complete -  [place] or Home Care Consult Complete -  SW Recovery Care/Counseling Consult Complete -  Palliative Care Screening Not Applicable -  Skilled Nursing  Facility Complete -  Some recent data might be hidden

## 2021-12-13 NOTE — Progress Notes (Signed)
Progress Note  Patient Name: [name] Date of Encounter: 12/13/2021  [facility] Cardiologist: Kathlyn Sacramento, MD   Subjective   Hgb 7.4 today. UOP -1.8L. Patient is feeling about the same. No chest pain.   Inpatient Medications    Scheduled Meds:  acidophilus  1 capsule Oral Daily   ALPRAZolam  0.5 mg Oral QHS   vitamin C  1,000 mg Oral Daily   fluconazole  100 mg Oral Daily   fluticasone  2 spray Each Nare BID   folic acid  2,505 mcg Oral Daily   furosemide  40 mg Intravenous Q12H   gabapentin  600 mg Oral BID AC & HS   guaiFENesin  600 mg Oral BID   hydroxychloroquine  200 mg Oral BID   iron polysaccharides  150 mg Oral Daily   loratadine  10 mg Oral Daily   [START ON 12/15/2021] methotrexate  25 mg Oral Weekly   metoprolol succinate  12.5 mg Oral BID   mirabegron ER  50 mg Oral Daily   multivitamin with minerals  1 tablet Oral Daily   pantoprazole  40 mg Oral Daily   polyethylene glycol  17 g Oral QHS   pravastatin  20 mg Oral q1800   traZODone  50 mg Oral QHS   Continuous Infusions:  PRN Meds: acetaminophen **OR** acetaminophen, albuterol, EPINEPHrine, magnesium hydroxide, ondansetron **OR** ondansetron (ZOFRAN) IV, traZODone   Vital Signs    Vitals:   12/12/21 2256 12/13/21 0638 12/13/21 0641 12/13/21 0858  BP: 107/60  (!) 118/49 (!) 118/49  Pulse: 78  75 74  Resp:   18 16  Temp:   98.2 F (36.8 C) (!) 97.4 F (36.3 C)  TempSrc:   Oral   SpO2:   92% 94%  Weight:  91.2 kg    Height:        Intake/Output Summary (Last 24 hours) at 12/13/2021 1209 Last data filed at 12/13/2021 1035 Gross per 24 hour  Intake 0 ml  Output 900 ml  Net -900 ml   Last 3 Weights 12/13/2021 12/12/2021 12/10/2021  Weight (lbs) 201 lb 1.6 oz 206 lb 5.6 oz 200 lb  Weight (kg) 91.218 kg 93.6 kg 90.719 kg  Some encounter information is confidential and restricted. Go to Review Flowsheets activity to see all data.      Telemetry    N/A - Personally Reviewed  ECG     No new - Personally Reviewed  Physical Exam   GEN: No acute distress.   Neck: No JVD Cardiac: RRR, no murmurs, rubs, or gallops.  Respiratory: Clear to auscultation bilaterally. GI: Soft, nontender, non-distended  MS: Trace pedal edema; No deformity. Neuro:  Nonfocal  Psych: Normal affect   Labs    High Sensitivity Troponin:   Recent Labs  Lab 12/10/21 1639 12/11/21 0300  TROPONINIHS 32* 22*     Chemistry Recent Labs  Lab 12/10/21 1639 12/11/21 0547 12/12/21 0451 12/13/21 0545  NA 141 136 139 138  K 4.0 3.6 3.5 3.6  CL 99 98 99 97*  CO2 33* 31 32 33*  GLUCOSE 95 103* 98 102*  BUN 28* 24* 20 19  CREATININE 1.41* 1.16 1.00 1.20  CALCIUM 8.4* 7.7* 7.9* 8.0*  MG  --   --  2.0 1.9  PROT 6.2*  --   --   --   ALBUMIN 3.2*  --   --   --   AST 19  --   --   --  ALT 11  --   --   --   ALKPHOS 56  --   --   --   BILITOT 0.6  --   --   --   GFRNONAA 49* >60 >60 59*  ANIONGAP 9 7 8 8     Lipids No results for input(s): CHOL, TRIG, HDL, LABVLDL, LDLCALC, CHOLHDL in the last 168 hours.  Hematology Recent Labs  Lab 12/11/21 0547 12/12/21 0451 12/13/21 0545  WBC 4.7 4.1 4.1  RBC 2.28* 2.44* 2.32*  HGB 7.4* 7.8* 7.4*  HCT 23.0* 24.2* 22.7*  MCV 100.9* 99.2 97.8  MCH 32.5 32.0 31.9  MCHC 32.2 32.2 32.6  RDW 15.9* 15.6* 15.5  PLT 194 209 229   Thyroid No results for input(s): TSH, FREET4 in the last 168 hours.  BNP Recent Labs  Lab 12/10/21 1647  BNP 585.0*    DDimer No results for input(s): DDIMER in the last 168 hours.   Radiology    ECHOCARDIOGRAM COMPLETE  Result Date: 12/12/2021    ECHOCARDIOGRAM REPORT   Patient Name:   DARSHAWN BOATENG Date of Exam: 12/12/2021 Medical Rec #:  [id number]    Height:       69.0 in Accession #:    [id number]   Weight:       206.3 lb Date of Birth:  07/16/1934    BSA:          2.094 m Patient Age:    45 years     BP:           112/42 mmHg Patient Gender: M            HR:           82 bpm. Exam Location:  [facility] Procedure: 2D Echo  Indications:     Dyspnea R06.00  History:         Patient has prior history of Echocardiogram examinations. CHF,                  CAD, Aortic Valve Disease, Arrythmias:Atrial Fibrillation; Risk                  Factors:Hypertension.  Sonographer:     Wallace Keller Thornton-Maynard Referring Phys:  [id number] Kate Sable Diagnosing Phys: Kate Sable MD IMPRESSIONS  1. Left ventricular ejection fraction, by estimation, is 60 to 65%. The left ventricle has normal function. The left ventricle has no regional wall motion abnormalities. There is mild left ventricular hypertrophy. Left ventricular diastolic parameters are consistent with Grade I diastolic dysfunction (impaired relaxation).  2. Right ventricular systolic function is low normal. The right ventricular size is normal. There is normal pulmonary artery systolic pressure.  3. The mitral valve is grossly normal. Mild mitral valve regurgitation.  4. The aortic valve is calcified. Aortic valve regurgitation is trivial. Moderate aortic valve stenosis. Aortic valve area, by VTI measures 1.32 cm. Aortic valve mean gradient measures 23.0 mmHg. Aortic valve Vmax measures 3.12 m/s.  5. Aortic dilatation noted. There is borderline dilatation of the aortic root, measuring 38 mm. FINDINGS  Left Ventricle: Left ventricular ejection fraction, by estimation, is 60 to 65%. The left ventricle has normal function. The left ventricle has no regional wall motion abnormalities. The left ventricular internal cavity size was normal in size. There is  mild left ventricular hypertrophy. Left ventricular diastolic parameters are consistent with Grade I diastolic dysfunction (impaired relaxation). Right Ventricle: The right ventricular size is normal. No increase in right ventricular wall thickness. Right  ventricular systolic function is low normal. There is normal pulmonary artery systolic pressure. The tricuspid regurgitant velocity is 2.85 m/s,  and with an assumed right atrial  pressure of 3 mmHg, the estimated right ventricular systolic pressure is 25.9 mmHg. Left Atrium: Left atrial size was normal in size. Right Atrium: Right atrial size was normal in size. Pericardium: There is no evidence of pericardial effusion. Mitral Valve: The mitral valve is grossly normal. Mild mitral valve regurgitation. Tricuspid Valve: The tricuspid valve is not well visualized. Tricuspid valve regurgitation is not demonstrated. Aortic Valve: The aortic valve is calcified. Aortic valve regurgitation is trivial. Moderate aortic stenosis is present. Aortic valve mean gradient measures 23.0 mmHg. Aortic valve peak gradient measures 38.9 mmHg. Aortic valve area, by VTI measures 1.32  cm. Pulmonic Valve: The pulmonic valve was not well visualized. Pulmonic valve regurgitation is not visualized. Aorta: Aortic dilatation noted. There is borderline dilatation of the aortic root, measuring 38 mm. Venous: The inferior vena cava was not well visualized. IAS/Shunts: No atrial level shunt detected by color flow Doppler.  LEFT VENTRICLE PLAX 2D LVIDd:         4.50 cm     Diastology LVIDs:         3.00 cm     LV e' medial:    5.66 cm/s LV PW:         1.20 cm     LV E/e' medial:  13.0 LV IVS:        1.30 cm     LV e' lateral:   9.36 cm/s LVOT diam:     2.50 cm     LV E/e' lateral: 7.8 LV SV:         82 LV SV Index:   39 LVOT Area:     4.91 cm  LV Volumes (MOD) LV vol d, MOD A2C: 39.3 ml LV vol d, MOD A4C: 69.4 ml LV vol s, MOD A2C: 10.0 ml LV vol s, MOD A4C: 15.6 ml LV SV MOD A2C:     29.3 ml LV SV MOD A4C:     69.4 ml LV SV MOD BP:      41.0 ml RIGHT VENTRICLE RV S prime:     13.30 cm/s TAPSE (M-mode): 2.0 cm LEFT ATRIUM             Index LA diam:        3.70 cm 1.77 cm/m LA Vol (A2C):   60.8 ml 29.04 ml/m LA Vol (A4C):   68.3 ml 32.62 ml/m LA Biplane Vol: 65.3 ml 31.19 ml/m  AORTIC VALVE                     PULMONIC VALVE AV Area (Vmax):    1.38 cm      PV Vmax:       0.76 m/s AV Area (Vmean):   1.48 cm      PV  Peak grad:  2.3 mmHg AV Area (VTI):     1.32 cm AV Vmax:           312.00 cm/s AV Vmean:          226.000 cm/s AV VTI:            0.620 m AV Peak Grad:      38.9 mmHg AV Mean Grad:      23.0 mmHg LVOT Vmax:         87.50 cm/s LVOT Vmean:  68.000 cm/s LVOT VTI:          0.167 m LVOT/AV VTI ratio: 0.27  AORTA Ao Root diam: 3.80 cm Ao Asc diam:  3.30 cm MITRAL VALVE               TRICUSPID VALVE MV Area (PHT): 3.17 cm    TR Peak grad:   32.5 mmHg MV Decel Time: 239 msec    TR Vmax:        285.00 cm/s MV E velocity: 73.30 cm/s MV A velocity: 94.30 cm/s  SHUNTS MV E/A ratio:  0.78        Systemic VTI:  0.17 m MV A Prime:    11.7 cm/s   Systemic Diam: 2.50 cm Kate Sable MD Electronically signed by Kate Sable MD Signature Date/Time: 12/12/2021/12:54:42 PM    Final     Cardiac Studies   TTE 12/12/2021 1. Left ventricular ejection fraction, by estimation, is 60 to 65%. The  left ventricle has normal function. The left ventricle has no regional  wall motion abnormalities. There is mild left ventricular hypertrophy.  Left ventricular diastolic parameters  are consistent with Grade I diastolic dysfunction (impaired relaxation).   2. Right ventricular systolic function is low normal. The right  ventricular size is normal. There is normal pulmonary artery systolic  pressure.   3. The mitral valve is grossly normal. Mild mitral valve regurgitation.   4. The aortic valve is calcified. Aortic valve regurgitation is trivial.  Moderate aortic valve stenosis. Aortic valve area, by VTI measures 1.32  cm. Aortic valve mean gradient measures 23.0 mmHg. Aortic valve Vmax  measures 3.12 m/s.   5. Aortic dilatation noted. There is borderline dilatation of the aortic  root, measuring 38 mm.  Patient Profile     88 y.o. male with history of HFpEF, atrial fibrillation on Eliquis, aortic stenosis, hyperlipidemia, prostate cancer s/p prostatectomy, chronic indwelling Foley catheter who presents due to  weakness and fatigue, diagnosed with UTI and pneumonia.  Being seen for CHF.  Assessment & Plan    HFpEF LLE - BNP 585 and CXR with pulmonary edema - IV lasix 40mg  BID - Net -3.3L - kidney function stable - Trace pedal edema on exam, suspect he might not need much more IV lasix.  - continue BB  Anemia - Hgb 7.4 today - Eliquis discontinued -per IM - on oral iron - May need GI consult  Afib - Eliquis held for anemia/fall risk -Maintaining NSR - continue rate control with Toprol XL 12.5mg  daily  UTI - with weakness and fatigue - was also being treated for possible PNA - per IM   For questions or updates, please contact [facility] Please consult [email or web address] for contact info under        Signed, Traeh Milroy Ninfa Meeker, PA-C  12/13/2021, 12:09 PM

## 2021-12-13 NOTE — Plan of Care (Signed)
?  Problem: Activity: ?Goal: Capacity to carry out activities will improve ?Outcome: Progressing ?  ?

## 2021-12-13 NOTE — Progress Notes (Signed)
PROGRESS NOTE    ARNOLD KESTER  DGU:[id number] DOB: [birth year] DOA: 12/11/2021 PCP: [name], MD    Brief Narrative:  [name] is a 88 y.o. male with medical history significant for anxiety, asthma, CAD, CHF, CKD, who presented to the ER with acute onset of generalized weakness and fatigue.  He admitted to recent dry cough and stated that he has been recently treated for pneumonia few weeks ago.  Chest x-ray showed evidence of pulmonary edema, questionable pneumonia.  Patient was giving IV Lasix, antibiotics. 1/15.  Pneumonia ruled out, continue IV Lasix.   Assessment & Plan:   Active Problems:   Acute kidney injury superimposed on CKD (HCC)   Generalized weakness   Acute heart failure with preserved ejection fraction (HCC)   Atrial fibrillation (HCC)   Iron deficiency anemia  Acute on chronic diastolic congestive heart failure. Moderate aortic stenosis. Reviewed echocardiogram, ejection fraction 60 to 65% with moderate aortic stenosis. Patient volume status seem to be better, renal function still normal, will continue IV Lasix.  Appreciate cardiology consult.    Generalized weakness. Iron deficient anemia. Continue oral iron.  Hemoglobin today 7.4, no evidence of GI bleed. Recheck a CBC tomorrow. Patient has been seen by PT/OT, recommended nursing home placement.   Acute kidney injury. Renal function normalized after giving Lasix.  Paroxysmal atrial fibrillation. Cardiology has discontinued Eliquis due to concern of fall risk and a drop in hemoglobin.  Indwelling Foley catheter.  Insomnia. DC trazodone, started lower dose Seroquel   DVT prophylaxis: Eliquis Code Status: full Family Communication:  Disposition Plan: Anticipating discharge to nursing home due to significant weakness.    I/O last 3 completed shifts: In: 350 [IV Piggyback:350] Out: 1850 [Urine:1850] No intake/output data recorded.     Consultants:  Card  Procedures:  None  Antimicrobials: None  Subjective: Patient still very tired, but does not feel short of breath.  No cough. He could not sleep after taking trazodone. No abdominal pain or nausea vomiting. No fever or chills. No dysuria hematuria.  Objective: Vitals:   12/12/21 2256 12/13/21 0638 12/13/21 0641 12/13/21 0858  BP: 107/60  (!) 118/49 (!) 118/49  Pulse: 78  75 74  Resp:   18 16  Temp:   98.2 F (36.8 C) (!) 97.4 F (36.3 C)  TempSrc:   Oral   SpO2:   92% 94%  Weight:  91.2 kg    Height:        Intake/Output Summary (Last 24 hours) at 12/13/2021 1223 Last data filed at 12/13/2021 1035 Gross per 24 hour  Intake 0 ml  Output 900 ml  Net -900 ml   Filed Weights   12/10/21 1641 12/12/21 0500 12/13/21 0638  Weight: 90.7 kg 93.6 kg 91.2 kg    Examination:  General exam: Appears calm and comfortable  Respiratory system: Crackles in the base bilaterally. Respiratory effort normal. Cardiovascular system: Irregular. No JVD, murmurs, rubs, gallops or clicks. No pedal edema. Gastrointestinal system: Abdomen is nondistended, soft and nontender. No organomegaly or masses felt. Normal bowel sounds heard. Central nervous system: Alert and oriented x2. No focal neurological deficits. Extremities: Symmetric 5 x 5 power. Skin: No rashes, lesions or ulcers Psychiatry: Judgement and insight appear normal. Mood & affect appropriate.     Data Reviewed: I have personally reviewed following labs and imaging studies  CBC: Recent Labs  Lab 12/10/21 1639 12/11/21 0547 12/12/21 0451 12/13/21 0545  WBC 6.2 4.7 4.1 4.1  NEUTROABS 4.9  --  2.9 2.8  HGB 9.2* 7.4* 7.8* 7.4*  HCT 28.8* 23.0* 24.2* 22.7*  MCV 102.1* 100.9* 99.2 97.8  PLT 209 194 209 017   Basic Metabolic Panel: Recent Labs  Lab 12/10/21 1639 12/11/21 0547 12/12/21 0451 12/13/21 0545  NA 141 136 139 138  K 4.0 3.6 3.5 3.6  CL 99 98 99 97*  CO2 33* 31 32 33*  GLUCOSE 95 103* 98 102*  BUN 28* 24* 20 19   CREATININE 1.41* 1.16 1.00 1.20  CALCIUM 8.4* 7.7* 7.9* 8.0*  MG  --   --  2.0 1.9   GFR: Estimated Creatinine Clearance: 50.2 mL/min (by C-G formula based on SCr of 1.2 mg/dL). Liver Function Tests: Recent Labs  Lab 12/10/21 1639  AST 19  ALT 11  ALKPHOS 56  BILITOT 0.6  PROT 6.2*  ALBUMIN 3.2*   No results for input(s): LIPASE, AMYLASE in the last 168 hours. No results for input(s): AMMONIA in the last 168 hours. Coagulation Profile: No results for input(s): INR, PROTIME in the last 168 hours. Cardiac Enzymes: No results for input(s): CKTOTAL, CKMB, CKMBINDEX, TROPONINI in the last 168 hours. BNP (last 3 results) No results for input(s): PROBNP in the last 8760 hours. HbA1C: No results for input(s): HGBA1C in the last 72 hours. CBG: No results for input(s): GLUCAP in the last 168 hours. Lipid Profile: No results for input(s): CHOL, HDL, LDLCALC, TRIG, CHOLHDL, LDLDIRECT in the last 72 hours. Thyroid Function Tests: No results for input(s): TSH, T4TOTAL, FREET4, T3FREE, THYROIDAB in the last 72 hours. Anemia Panel: Recent Labs    12/11/21 0547 12/11/21 1522  VITAMINB12  --  1,514*  FERRITIN 114  --   TIBC 168*  --   IRON 29*  --    Sepsis Labs: Recent Labs  Lab 12/10/21 1647 12/11/21 0300 12/11/21 0547 12/12/21 0451  PROCALCITON 0.33  --  0.19 0.17  LATICACIDVEN  --  1.0  --   --     Recent Results (from the past 240 hour(s))  Urine Culture     Status: Abnormal   Collection Time: 12/10/21  9:30 PM   Specimen: Urine, Random  Result Value Ref Range Status   Specimen Description   Final    URINE, RANDOM Performed at [facility], [address] [place], [place] [address]    Special Requests   Final    NONE Performed at [facility], [address] [place], [place] [address]    Culture MULTIPLE SPECIES PRESENT, SUGGEST RECOLLECTION (A)  Final   Report Status 12/12/2021 FINAL  Final  Resp Panel by RT-PCR (Flu A&B, Covid)  Nasopharyngeal Swab     Status: None   Collection Time: 12/11/21  3:00 AM   Specimen: Nasopharyngeal Swab; Nasopharyngeal(NP) swabs in vial transport medium  Result Value Ref Range Status   SARS Coronavirus 2 by RT PCR NEGATIVE NEGATIVE Final    Comment: (NOTE) SARS-CoV-2 target nucleic acids are NOT DETECTED.  The SARS-CoV-2 RNA is generally detectable in upper respiratory specimens during the acute phase of infection. The lowest concentration of SARS-CoV-2 viral copies this assay can detect is 138 copies/mL. A negative result does not preclude SARS-Cov-2 infection and should not be used as the sole basis for treatment or other patient management decisions. A negative result may occur with  improper specimen collection/handling, submission of specimen other than nasopharyngeal swab, presence of viral mutation(s) within the areas targeted by this assay, and inadequate number of viral copies(<138 copies/mL). A  negative result must be combined with clinical observations, patient history, and epidemiological information. The expected result is Negative.  Fact Sheet for Patients:  EntrepreneurPulse.com.au  Fact Sheet for Healthcare Providers:  IncredibleEmployment.be  This test is no t yet approved or cleared by the [place] FDA and  has been authorized for detection and/or diagnosis of SARS-CoV-2 by FDA under an Emergency Use Authorization (EUA). This EUA will remain  in effect (meaning this test can be used) for the duration of the COVID-19 declaration under Section 564(b)(1) of the Act, 21 U.S.C.section 360bbb-3(b)(1), unless the authorization is terminated  or revoked sooner.       Influenza A by PCR NEGATIVE NEGATIVE Final   Influenza B by PCR NEGATIVE NEGATIVE Final    Comment: (NOTE) The Xpert Xpress SARS-CoV-2/FLU/RSV plus assay is intended as an aid in the diagnosis of influenza from Nasopharyngeal swab specimens and should not be  used as a sole basis for treatment. Nasal washings and aspirates are unacceptable for Xpert Xpress SARS-CoV-2/FLU/RSV testing.  Fact Sheet for Patients: EntrepreneurPulse.com.au  Fact Sheet for Healthcare Providers: IncredibleEmployment.be  This test is not yet approved or cleared by the [place] FDA and has been authorized for detection and/or diagnosis of SARS-CoV-2 by FDA under an Emergency Use Authorization (EUA). This EUA will remain in effect (meaning this test can be used) for the duration of the COVID-19 declaration under Section 564(b)(1) of the Act, 21 U.S.C. section 360bbb-3(b)(1), unless the authorization is terminated or revoked.  Performed at [facility], [address] [place], [place] [address]   Culture, blood (Routine X 2) w Reflex to ID Panel     Status: None (Preliminary result)   Collection Time: 12/11/21  3:00 AM   Specimen: BLOOD  Result Value Ref Range Status   Specimen Description BLOOD BLOOD LEFT FOREARM  Final   Special Requests   Final    BOTTLES DRAWN AEROBIC AND ANAEROBIC Blood Culture results may not be optimal due to an inadequate volume of blood received in culture bottles   Culture   Final    NO GROWTH 2 DAYS Performed at [facility], [address], [place], [place] [address]    Report Status PENDING  Incomplete  Culture, blood (Routine X 2) w Reflex to ID Panel     Status: None (Preliminary result)   Collection Time: 12/11/21  3:00 AM   Specimen: BLOOD  Result Value Ref Range Status   Specimen Description BLOOD LEFT ANTECUBITAL  Final   Special Requests   Final    BOTTLES DRAWN AEROBIC AND ANAEROBIC Blood Culture results may not be optimal due to an inadequate volume of blood received in culture bottles   Culture   Final    NO GROWTH 2 DAYS Performed at [facility], [address] [place], [place] [address]    Report Status PENDING  Incomplete          Radiology Studies: ECHOCARDIOGRAM COMPLETE  Result Date: 12/12/2021    ECHOCARDIOGRAM REPORT   Patient Name:   DREYTON ROESSNER Date of Exam: 12/12/2021 Medical Rec #:  [id number]    Height:       69.0 in Accession #:    [id number]   Weight:       206.3 lb Date of Birth:  Nov 13, 1934    BSA:          2.094 m Patient Age:    32 years     BP:  112/42 mmHg Patient Gender: M            HR:           82 bpm. Exam Location:  [facility] Procedure: 2D Echo Indications:     Dyspnea R06.00  History:         Patient has prior history of Echocardiogram examinations. CHF,                  CAD, Aortic Valve Disease, Arrythmias:Atrial Fibrillation; Risk                  Factors:Hypertension.  Sonographer:     Wallace Keller Thornton-Maynard Referring Phys:  [id number] Kate Sable Diagnosing Phys: Kate Sable MD IMPRESSIONS  1. Left ventricular ejection fraction, by estimation, is 60 to 65%. The left ventricle has normal function. The left ventricle has no regional wall motion abnormalities. There is mild left ventricular hypertrophy. Left ventricular diastolic parameters are consistent with Grade I diastolic dysfunction (impaired relaxation).  2. Right ventricular systolic function is low normal. The right ventricular size is normal. There is normal pulmonary artery systolic pressure.  3. The mitral valve is grossly normal. Mild mitral valve regurgitation.  4. The aortic valve is calcified. Aortic valve regurgitation is trivial. Moderate aortic valve stenosis. Aortic valve area, by VTI measures 1.32 cm. Aortic valve mean gradient measures 23.0 mmHg. Aortic valve Vmax measures 3.12 m/s.  5. Aortic dilatation noted. There is borderline dilatation of the aortic root, measuring 38 mm. FINDINGS  Left Ventricle: Left ventricular ejection fraction, by estimation, is 60 to 65%. The left ventricle has normal function. The left ventricle has no regional wall motion abnormalities. The left ventricular internal cavity size was  normal in size. There is  mild left ventricular hypertrophy. Left ventricular diastolic parameters are consistent with Grade I diastolic dysfunction (impaired relaxation). Right Ventricle: The right ventricular size is normal. No increase in right ventricular wall thickness. Right ventricular systolic function is low normal. There is normal pulmonary artery systolic pressure. The tricuspid regurgitant velocity is 2.85 m/s,  and with an assumed right atrial pressure of 3 mmHg, the estimated right ventricular systolic pressure is 03.4 mmHg. Left Atrium: Left atrial size was normal in size. Right Atrium: Right atrial size was normal in size. Pericardium: There is no evidence of pericardial effusion. Mitral Valve: The mitral valve is grossly normal. Mild mitral valve regurgitation. Tricuspid Valve: The tricuspid valve is not well visualized. Tricuspid valve regurgitation is not demonstrated. Aortic Valve: The aortic valve is calcified. Aortic valve regurgitation is trivial. Moderate aortic stenosis is present. Aortic valve mean gradient measures 23.0 mmHg. Aortic valve peak gradient measures 38.9 mmHg. Aortic valve area, by VTI measures 1.32  cm. Pulmonic Valve: The pulmonic valve was not well visualized. Pulmonic valve regurgitation is not visualized. Aorta: Aortic dilatation noted. There is borderline dilatation of the aortic root, measuring 38 mm. Venous: The inferior vena cava was not well visualized. IAS/Shunts: No atrial level shunt detected by color flow Doppler.  LEFT VENTRICLE PLAX 2D LVIDd:         4.50 cm     Diastology LVIDs:         3.00 cm     LV e' medial:    5.66 cm/s LV PW:         1.20 cm     LV E/e' medial:  13.0 LV IVS:        1.30 cm     LV e' lateral:   9.36  cm/s LVOT diam:     2.50 cm     LV E/e' lateral: 7.8 LV SV:         82 LV SV Index:   39 LVOT Area:     4.91 cm  LV Volumes (MOD) LV vol d, MOD A2C: 39.3 ml LV vol d, MOD A4C: 69.4 ml LV vol s, MOD A2C: 10.0 ml LV vol s, MOD A4C: 15.6 ml LV  SV MOD A2C:     29.3 ml LV SV MOD A4C:     69.4 ml LV SV MOD BP:      41.0 ml RIGHT VENTRICLE RV S prime:     13.30 cm/s TAPSE (M-mode): 2.0 cm LEFT ATRIUM             Index LA diam:        3.70 cm 1.77 cm/m LA Vol (A2C):   60.8 ml 29.04 ml/m LA Vol (A4C):   68.3 ml 32.62 ml/m LA Biplane Vol: 65.3 ml 31.19 ml/m  AORTIC VALVE                     PULMONIC VALVE AV Area (Vmax):    1.38 cm      PV Vmax:       0.76 m/s AV Area (Vmean):   1.48 cm      PV Peak grad:  2.3 mmHg AV Area (VTI):     1.32 cm AV Vmax:           312.00 cm/s AV Vmean:          226.000 cm/s AV VTI:            0.620 m AV Peak Grad:      38.9 mmHg AV Mean Grad:      23.0 mmHg LVOT Vmax:         87.50 cm/s LVOT Vmean:        68.000 cm/s LVOT VTI:          0.167 m LVOT/AV VTI ratio: 0.27  AORTA Ao Root diam: 3.80 cm Ao Asc diam:  3.30 cm MITRAL VALVE               TRICUSPID VALVE MV Area (PHT): 3.17 cm    TR Peak grad:   32.5 mmHg MV Decel Time: 239 msec    TR Vmax:        285.00 cm/s MV E velocity: 73.30 cm/s MV A velocity: 94.30 cm/s  SHUNTS MV E/A ratio:  0.78        Systemic VTI:  0.17 m MV A Prime:    11.7 cm/s   Systemic Diam: 2.50 cm Kate Sable MD Electronically signed by Kate Sable MD Signature Date/Time: 12/12/2021/12:54:42 PM    Final         Scheduled Meds:  acidophilus  1 capsule Oral Daily   ALPRAZolam  0.5 mg Oral QHS   vitamin C  1,000 mg Oral Daily   fluconazole  100 mg Oral Daily   fluticasone  2 spray Each Nare BID   folic acid  2,409 mcg Oral Daily   furosemide  40 mg Intravenous Q12H   gabapentin  600 mg Oral BID AC & HS   guaiFENesin  600 mg Oral BID   hydroxychloroquine  200 mg Oral BID   iron polysaccharides  150 mg Oral Daily   loratadine  10 mg Oral Daily   [START ON 12/15/2021] methotrexate  25 mg Oral Weekly  metoprolol succinate  12.5 mg Oral BID   mirabegron ER  50 mg Oral Daily   multivitamin with minerals  1 tablet Oral Daily   pantoprazole  40 mg Oral Daily   polyethylene  glycol  17 g Oral QHS   pravastatin  20 mg Oral q1800   QUEtiapine  25 mg Oral QHS   Continuous Infusions:   LOS: 2 days    Time spent: 28 minutes    Sharen Hones, MD [facility]   To contact the attending provider between 7A-7P or the covering provider during after hours 7P-7A, please log into the web site [email or web address] and access using universal [place] password for that web site. If you do not have the password, please call the hospital operator.  12/13/2021, 12:23 PM

## 2021-12-14 ENCOUNTER — Ambulatory Visit: Payer: Medicare PPO

## 2021-12-14 LAB — CBC WITH DIFFERENTIAL/PLATELET
Abs Immature Granulocytes: 0.06 10*3/uL (ref 0.00–0.07)
Basophils Absolute: 0.1 10*3/uL (ref 0.0–0.1)
Basophils Relative: 1 %
Eosinophils Absolute: 0.2 10*3/uL (ref 0.0–0.5)
Eosinophils Relative: 6 %
HCT: 23.4 % — ABNORMAL LOW (ref 39.0–52.0)
Hemoglobin: 7.7 g/dL — ABNORMAL LOW (ref 13.0–17.0)
Immature Granulocytes: 2 %
Lymphocytes Relative: 24 %
Lymphs Abs: 0.9 10*3/uL (ref 0.7–4.0)
MCH: 32.6 pg (ref 26.0–34.0)
MCHC: 32.9 g/dL (ref 30.0–36.0)
MCV: 99.2 fL (ref 80.0–100.0)
Monocytes Absolute: 0.2 10*3/uL (ref 0.1–1.0)
Monocytes Relative: 6 %
Neutro Abs: 2.3 10*3/uL (ref 1.7–7.7)
Neutrophils Relative %: 61 %
Platelets: 234 10*3/uL (ref 150–400)
RBC: 2.36 MIL/uL — ABNORMAL LOW (ref 4.22–5.81)
RDW: 15.3 % (ref 11.5–15.5)
WBC: 3.7 10*3/uL — ABNORMAL LOW (ref 4.0–10.5)
nRBC: 0 % (ref 0.0–0.2)

## 2021-12-14 LAB — BASIC METABOLIC PANEL
Anion gap: 6 (ref 5–15)
BUN: 20 mg/dL (ref 8–23)
CO2: 33 mmol/L — ABNORMAL HIGH (ref 22–32)
Calcium: 7.8 mg/dL — ABNORMAL LOW (ref 8.9–10.3)
Chloride: 97 mmol/L — ABNORMAL LOW (ref 98–111)
Creatinine, Ser: 1.23 mg/dL (ref 0.61–1.24)
GFR, Estimated: 58 mL/min — ABNORMAL LOW (ref >60)
Glucose, Bld: 98 mg/dL (ref 70–99)
Potassium: 3.7 mmol/L (ref 3.5–5.1)
Sodium: 136 mmol/L (ref 135–145)

## 2021-12-14 LAB — MAGNESIUM: Magnesium: 2 mg/dL (ref 1.7–2.4)

## 2021-12-14 MED ORDER — OXYCODONE-ACETAMINOPHEN 5-325 MG PO TABS
1.0000 | ORAL_TABLET | Freq: Four times a day (QID) | ORAL | Status: DC | PRN
  Administered 2021-12-14 – 2021-12-16 (×3): 1 via ORAL
  Filled 2021-12-14 (×4): qty 1

## 2021-12-14 NOTE — Plan of Care (Signed)
°  Problem: Education: °Goal: Ability to demonstrate management of disease process will improve °Outcome: Progressing °Goal: Ability to verbalize understanding of medication therapies will improve °Outcome: Progressing °Goal: Individualized Educational Video(s) °Outcome: Progressing °  °Problem: Activity: °Goal: Capacity to carry out activities will improve °Outcome: Progressing °  °Problem: Cardiac: °Goal: Ability to achieve and maintain adequate cardiopulmonary perfusion will improve °Outcome: Progressing °  °Problem: Activity: °Goal: Ability to tolerate increased activity will improve °Outcome: Progressing °  °Problem: Clinical Measurements: °Goal: Ability to maintain a body temperature in the normal range will improve °Outcome: Progressing °  °Problem: Respiratory: °Goal: Ability to maintain adequate ventilation will improve °Outcome: Progressing °Goal: Ability to maintain a clear airway will improve °Outcome: Progressing °  °Problem: Education: °Goal: Knowledge of General Education information will improve °Description: Including pain rating scale, medication(s)/side effects and non-pharmacologic comfort measures °Outcome: Progressing °  °Problem: Health Behavior/Discharge Planning: °Goal: Ability to manage health-related needs will improve °Outcome: Progressing °  °Problem: Clinical Measurements: °Goal: Ability to maintain clinical measurements within normal limits will improve °Outcome: Progressing °Goal: Will remain free from infection °Outcome: Progressing °Goal: Diagnostic test results will improve °Outcome: Progressing °Goal: Respiratory complications will improve °Outcome: Progressing °Goal: Cardiovascular complication will be avoided °Outcome: Progressing °  °Problem: Activity: °Goal: Risk for activity intolerance will decrease °Outcome: Progressing °  °Problem: Nutrition: °Goal: Adequate nutrition will be maintained °Outcome: Progressing °  °Problem: Coping: °Goal: Level of anxiety will  decrease °Outcome: Progressing °  °Problem: Elimination: °Goal: Will not experience complications related to bowel motility °Outcome: Progressing °Goal: Will not experience complications related to urinary retention °Outcome: Progressing °  °Problem: Pain Managment: °Goal: General experience of comfort will improve °Outcome: Progressing °  °Problem: Safety: °Goal: Ability to remain free from injury will improve °Outcome: Progressing °  °Problem: Skin Integrity: °Goal: Risk for impaired skin integrity will decrease °Outcome: Progressing °  °

## 2021-12-14 NOTE — TOC Progression Note (Addendum)
Transition of Care [facility]) - Progression Note    Patient Details  Name: [name] MRN: [id number] Date of Birth: 1934-05-31  Transition of Care [facility]) CM/SW [place], RN Phone Number: 12/14/2021, 4:33 PM  Clinical Narrative:   Spoke with the patient and called his daughter [name] while in the room, She is going to review the facility [facility] in [place] and let us know if it is acceptable, she will let me know and then I will start Ins approval         Expected Discharge Plan and Services                                                 Social Determinants of Health (SDOH) Interventions    Readmission Risk Interventions Readmission Risk Prevention Plan 09/12/2021 06/23/2019  Transportation Screening Complete Complete  PCP or Specialist Appt within 3-5 Days - Complete  HRI or [place] - Complete  Social Work Consult for [place] Planning/Counseling - Complete  Palliative Care Screening - Not Applicable  Medication Review Press photographer) Complete Complete  PCP or Specialist appointment within 3-5 days of discharge Complete -  [place] or Home Care Consult Complete -  SW Recovery Care/Counseling Consult Complete -  Palliative Care Screening Not Applicable -  Skilled Nursing Facility Complete -  Some recent data might be hidden

## 2021-12-14 NOTE — TOC Progression Note (Signed)
Transition of Care [facility]) - Progression Note    Patient Details  Name: MICHAIL BOYTE MRN: [id number] Date of Birth: [birth year]  Transition of Care [facility]) CM/SW [place], RN Phone Number: 12/14/2021, 4:10 PM  Clinical Narrative:   Damaris Schooner to Suanne Marker at [place] in [place] [facility], [facility] They are offering a bed for STR,          Expected Discharge Plan and Services                                                 Social Determinants of Health (SDOH) Interventions    Readmission Risk Interventions Readmission Risk Prevention Plan 09/12/2021 06/23/2019  Transportation Screening Complete Complete  PCP or Specialist Appt within 3-5 Days - Complete  HRI or [place] - Complete  Social Work Consult for [place] Planning/Counseling - Complete  Palliative Care Screening - Not Applicable  Medication Review Press photographer) Complete Complete  PCP or Specialist appointment within 3-5 days of discharge Complete -  [place] or Home Care Consult Complete -  SW Recovery Care/Counseling Consult Complete -  Palliative Care Screening Not Applicable -  Skilled Nursing Facility Complete -  Some recent data might be hidden

## 2021-12-14 NOTE — Progress Notes (Signed)
Physical Therapy Treatment Patient Details Name: [name] MRN: [id number] DOB: [birth year] Today's Date: 12/14/2021   History of Present Illness 88 y.o. male with medical history significant for anxiety, asthma, CAD, CHF, CKD, who presented to the ER with acute onset of generalized weakness and fatigue.  He admitted to recent dry cough and stated that he has been recently treated for pneumonia few weeks ago. Pt admitted for acute on chronic diastolic congestive heart failure.    PT Comments    Pt received supine in bed with P/OT co treat. Agreeable to session. Pt resting on RA. Able to transfer with modA+2, HOB elevated with assistance for RLE sequencing and trunk to attain sitting. Set up and minA for doffing sweatshirt due to limited shoulder mobility. Endorsing dizziness. SPO2 on RA in sitting recorded at 85%. Pt placed on 2L/min via [place] with improvement with PLB to 93%. X3 STS performed with seated rest between bouts with modA+2 and bed elevated. Pt displaying difficulty with anterior weight shift despite PT demo and heavy education on improved mechanics to assist in attaining upright standing to RW. Each standing bout pt unable to shift weight over hips and knees leading to knee/hip flexion and post lean relying on 2 person assist to maintain standing for NT to perform pericare. Post STS's pt performing LE therex EoB with limited AROM appreciated against gravity indicative of < 3/5 MMT strength in quads and hip flexors, relying on AAROM for long arc quads. Pt returning to bed with modA for LE management and truncal support with all needs in reach. Pt will continue to benefit from STR at discharge to improve mobility and LE strength to decrease risk of falls and reduce care giver burden prior to returning to home environment.     Recommendations for follow up therapy are one component of a multi-disciplinary discharge planning process, led by the attending physician.  Recommendations may be updated  based on patient status, additional functional criteria and insurance authorization.  Follow Up Recommendations  Skilled nursing-short term rehab (<3 hours/day)     Assistance Recommended at Discharge Frequent or constant Supervision/Assistance  Patient can return home with the following Two people to help with walking and/or transfers;Two people to help with bathing/dressing/bathroom;Assistance with cooking/housework;Assist for transportation;Help with stairs or ramp for entrance   Equipment Recommendations  None recommended by PT    Recommendations for Other Services       Precautions / Restrictions Precautions Precautions: Fall Restrictions Weight Bearing Restrictions: No     Mobility  Bed Mobility Overal bed mobility: Needs Assistance Bed Mobility: Supine to Sit, Sit to Supine     Supine to sit: Mod assist, +2 for safety/equipment Sit to supine: Mod assist, +2 for physical assistance   General bed mobility comments: cues for sequencing and assistance for LE's Patient Response: Cooperative  Transfers Overall transfer level: Needs assistance Equipment used: Rolling walker (2 wheels) Transfers: Sit to/from Stand Sit to Stand: Mod assist, +2 physical assistance, From elevated surface           General transfer comment: Education and demo on anterior trunk lean to assist in standing. Knees in flexed posture throughout.    Ambulation/Gait               General Gait Details: unable to perform due to unsafe mobility   Stairs             Wheelchair Mobility    Modified Rankin (Stroke Patients Only)  Balance Overall balance assessment: Needs assistance Sitting-balance support: No upper extremity supported, Feet supported Sitting balance-[name]: Fair     Standing balance support: Bilateral upper extremity supported, During functional activity Standing balance-[name]: Poor Standing balance comment: UNable to step laterally today  as pt unable to attain safe, upright static standing.                            Cognition Arousal/Alertness: Awake/alert Behavior During Therapy: WFL for tasks assessed/performed Overall Cognitive Status: Within Functional Limits for tasks assessed                                          Exercises General Exercises - Lower Extremity Long Arc Quad: Seated, AAROM, Strengthening, Both, 10 reps Hip Flexion/Marching: AROM, Seated, Strengthening, Both, 10 reps    General Comments General comments (skin integrity, edema, etc.): SPO2 on RA at 85-88%. On 2L/min [place] at 92%      Pertinent Vitals/Pain Pain Assessment Pain Assessment: No/denies pain    Home Living                          Prior Function            PT Goals (current goals can now be found in the care plan section) Acute Rehab PT Goals Patient Stated Goal: to get stronger and be able to move again Time For Goal Achievement: 12/26/21 Potential to Achieve Goals: Fair Progress towards PT goals: Not progressing toward goals - comment (unable to attain upright standing and take lateral steps)    Frequency    Min 2X/week      PT Plan Current plan remains appropriate    Co-evaluation PT/OT/SLP Co-Evaluation/Treatment: Yes Reason for Co-Treatment: Complexity of the patient's impairments (multi-system involvement);For patient/therapist safety;To address functional/ADL transfers PT goals addressed during session: Mobility/safety with mobility;Balance;Strengthening/ROM;Proper use of DME OT goals addressed during session: ADL's and self-care;Proper use of Adaptive equipment and DME;Strengthening/ROM      AM-PAC PT "6 Clicks" Mobility   Outcome Measure  Help needed turning from your back to your side while in a flat bed without using bedrails?: A Little Help needed moving from lying on your back to sitting on the side of a flat bed without using bedrails?: A Lot Help needed  moving to and from a bed to a chair (including a wheelchair)?: A Lot Help needed standing up from a chair using your arms (e.g., wheelchair or bedside chair)?: A Lot Help needed to walk in hospital room?: Total Help needed climbing 3-5 steps with a railing? : Total 6 Click Score: 11    End of Session Equipment Utilized During Treatment: Gait belt;Oxygen Activity Tolerance: Patient tolerated treatment well Patient left: in bed;with call bell/phone within reach;with bed alarm set Nurse Communication: Mobility status PT Visit Diagnosis: Unsteadiness on feet (R26.81);Other abnormalities of gait and mobility (R26.89);Muscle weakness (generalized) (M62.81);History of falling (Z91.81);Difficulty in walking, not elsewhere classified (R26.2)     Time: 7824-2353 PT Time Calculation (min) (ACUTE ONLY): 40 min  Charges:  $Therapeutic Exercise: 8-22 mins                     Salem Caster. Fairly IV, PT, DPT Physical Therapist- [facility]  12/14/2021, 1:12 PM

## 2021-12-14 NOTE — Progress Notes (Signed)
Progress Note  Patient Name: [name] Date of Encounter: 12/14/2021  [facility] Cardiologist: Kathlyn Sacramento, MD   Subjective   Hgb 7.7 today. Pt says he is feeling better, more energy. Awaiting bed placement at SNF.  Inpatient Medications    Scheduled Meds:  acidophilus  1 capsule Oral Daily   ALPRAZolam  0.5 mg Oral QHS   vitamin C  1,000 mg Oral Daily   fluconazole  100 mg Oral Daily   fluticasone  2 spray Each Nare BID   folic acid  6,333 mcg Oral Daily   furosemide  40 mg Oral Daily   gabapentin  600 mg Oral BID AC & HS   guaiFENesin  600 mg Oral BID   hydroxychloroquine  200 mg Oral BID   iron polysaccharides  150 mg Oral Daily   loratadine  10 mg Oral Daily   [START ON 12/15/2021] methotrexate  25 mg Oral Weekly   metoprolol succinate  12.5 mg Oral BID   mirabegron ER  50 mg Oral Daily   multivitamin with minerals  1 tablet Oral Daily   pantoprazole  40 mg Oral Daily   polyethylene glycol  17 g Oral QHS   pravastatin  20 mg Oral q1800   QUEtiapine  25 mg Oral QHS   Continuous Infusions:  PRN Meds: acetaminophen **OR** acetaminophen, albuterol, EPINEPHrine, magnesium hydroxide, ondansetron **OR** ondansetron (ZOFRAN) IV   Vital Signs    Vitals:   12/13/21 2007 12/14/21 0500 12/14/21 0621 12/14/21 0730  BP: (!) 119/33  (!) 127/44 (!) 133/50  Pulse: 81  76 82  Resp: 16  16 18   Temp: 98.5 F (36.9 C)  98.8 F (37.1 C) 98.1 F (36.7 C)  TempSrc: Oral  Oral   SpO2: 96%  93% 93%  Weight:  92.6 kg    Height:        Intake/Output Summary (Last 24 hours) at 12/14/2021 0816 Last data filed at 12/14/2021 0650 Gross per 24 hour  Intake 100 ml  Output 300 ml  Net -200 ml   Last 3 Weights 12/14/2021 12/13/2021 12/12/2021  Weight (lbs) 204 lb 2.3 oz 201 lb 1.6 oz 206 lb 5.6 oz  Weight (kg) 92.6 kg 91.218 kg 93.6 kg  Some encounter information is confidential and restricted. Go to Review Flowsheets activity to see all data.      Telemetry    N/A -  Personally Reviewed  ECG    N/A - Personally Reviewed  Physical Exam   GEN: No acute distress.   Neck: No JVD Cardiac: RRR, + murmur, no rubs, or gallops.  Respiratory: Clear to auscultation bilaterally. GI: Soft, nontender, non-distended  MS: No edema; No deformity. Neuro:  Nonfocal  Psych: Normal affect   Labs    High Sensitivity Troponin:   Recent Labs  Lab 12/10/21 1639 12/11/21 0300  TROPONINIHS 32* 22*     Chemistry Recent Labs  Lab 12/10/21 1639 12/11/21 0547 12/12/21 0451 12/13/21 0545 12/14/21 0506  NA 141   < > 139 138 136  K 4.0   < > 3.5 3.6 3.7  CL 99   < > 99 97* 97*  CO2 33*   < > 32 33* 33*  GLUCOSE 95   < > 98 102* 98  BUN 28*   < > 20 19 20   CREATININE 1.41*   < > 1.00 1.20 1.23  CALCIUM 8.4*   < > 7.9* 8.0* 7.8*  MG  --   --  2.0 1.9 2.0  PROT 6.2*  --   --   --   --   ALBUMIN 3.2*  --   --   --   --   AST 19  --   --   --   --   ALT 11  --   --   --   --   ALKPHOS 56  --   --   --   --   BILITOT 0.6  --   --   --   --   GFRNONAA 49*   < > >60 59* 58*  ANIONGAP 9   < > 8 8 6    < > = values in this interval not displayed.    Lipids No results for input(s): CHOL, TRIG, HDL, LABVLDL, LDLCALC, CHOLHDL in the last 168 hours.  Hematology Recent Labs  Lab 12/12/21 0451 12/13/21 0545 12/14/21 0506  WBC 4.1 4.1 3.7*  RBC 2.44* 2.32* 2.36*  HGB 7.8* 7.4* 7.7*  HCT 24.2* 22.7* 23.4*  MCV 99.2 97.8 99.2  MCH 32.0 31.9 32.6  MCHC 32.2 32.6 32.9  RDW 15.6* 15.5 15.3  PLT 209 229 234   Thyroid No results for input(s): TSH, FREET4 in the last 168 hours.  BNP Recent Labs  Lab 12/10/21 1647  BNP 585.0*    DDimer No results for input(s): DDIMER in the last 168 hours.   Radiology    ECHOCARDIOGRAM COMPLETE  Result Date: 12/12/2021    ECHOCARDIOGRAM REPORT   Patient Name:   DASTAN KRIDER Date of Exam: 12/12/2021 Medical Rec #:  [id number]    Height:       69.0 in Accession #:    [id number]   Weight:       206.3 lb Date of Birth:  03-03-1934     BSA:          2.094 m Patient Age:    88 years     BP:           112/42 mmHg Patient Gender: M            HR:           82 bpm. Exam Location:  [facility] Procedure: 2D Echo Indications:     Dyspnea R06.00  History:         Patient has prior history of Echocardiogram examinations. CHF,                  CAD, Aortic Valve Disease, Arrythmias:Atrial Fibrillation; Risk                  Factors:Hypertension.  Sonographer:     Wallace Keller Thornton-Maynard Referring Phys:  [id number] Kate Sable Diagnosing Phys: Kate Sable MD IMPRESSIONS  1. Left ventricular ejection fraction, by estimation, is 60 to 65%. The left ventricle has normal function. The left ventricle has no regional wall motion abnormalities. There is mild left ventricular hypertrophy. Left ventricular diastolic parameters are consistent with Grade I diastolic dysfunction (impaired relaxation).  2. Right ventricular systolic function is low normal. The right ventricular size is normal. There is normal pulmonary artery systolic pressure.  3. The mitral valve is grossly normal. Mild mitral valve regurgitation.  4. The aortic valve is calcified. Aortic valve regurgitation is trivial. Moderate aortic valve stenosis. Aortic valve area, by VTI measures 1.32 cm. Aortic valve mean gradient measures 23.0 mmHg. Aortic valve Vmax measures 3.12 m/s.  5. Aortic dilatation noted. There is borderline dilatation of the aortic root, measuring  38 mm. FINDINGS  Left Ventricle: Left ventricular ejection fraction, by estimation, is 60 to 65%. The left ventricle has normal function. The left ventricle has no regional wall motion abnormalities. The left ventricular internal cavity size was normal in size. There is  mild left ventricular hypertrophy. Left ventricular diastolic parameters are consistent with Grade I diastolic dysfunction (impaired relaxation). Right Ventricle: The right ventricular size is normal. No increase in right ventricular wall thickness. Right  ventricular systolic function is low normal. There is normal pulmonary artery systolic pressure. The tricuspid regurgitant velocity is 2.85 m/s,  and with an assumed right atrial pressure of 3 mmHg, the estimated right ventricular systolic pressure is 32.6 mmHg. Left Atrium: Left atrial size was normal in size. Right Atrium: Right atrial size was normal in size. Pericardium: There is no evidence of pericardial effusion. Mitral Valve: The mitral valve is grossly normal. Mild mitral valve regurgitation. Tricuspid Valve: The tricuspid valve is not well visualized. Tricuspid valve regurgitation is not demonstrated. Aortic Valve: The aortic valve is calcified. Aortic valve regurgitation is trivial. Moderate aortic stenosis is present. Aortic valve mean gradient measures 23.0 mmHg. Aortic valve peak gradient measures 38.9 mmHg. Aortic valve area, by VTI measures 1.32  cm. Pulmonic Valve: The pulmonic valve was not well visualized. Pulmonic valve regurgitation is not visualized. Aorta: Aortic dilatation noted. There is borderline dilatation of the aortic root, measuring 38 mm. Venous: The inferior vena cava was not well visualized. IAS/Shunts: No atrial level shunt detected by color flow Doppler.  LEFT VENTRICLE PLAX 2D LVIDd:         4.50 cm     Diastology LVIDs:         3.00 cm     LV e' medial:    5.66 cm/s LV PW:         1.20 cm     LV E/e' medial:  13.0 LV IVS:        1.30 cm     LV e' lateral:   9.36 cm/s LVOT diam:     2.50 cm     LV E/e' lateral: 7.8 LV SV:         82 LV SV Index:   39 LVOT Area:     4.91 cm  LV Volumes (MOD) LV vol d, MOD A2C: 39.3 ml LV vol d, MOD A4C: 69.4 ml LV vol s, MOD A2C: 10.0 ml LV vol s, MOD A4C: 15.6 ml LV SV MOD A2C:     29.3 ml LV SV MOD A4C:     69.4 ml LV SV MOD BP:      41.0 ml RIGHT VENTRICLE RV S prime:     13.30 cm/s TAPSE (M-mode): 2.0 cm LEFT ATRIUM             Index LA diam:        3.70 cm 1.77 cm/m LA Vol (A2C):   60.8 ml 29.04 ml/m LA Vol (A4C):   68.3 ml 32.62 ml/m LA  Biplane Vol: 65.3 ml 31.19 ml/m  AORTIC VALVE                     PULMONIC VALVE AV Area (Vmax):    1.38 cm      PV Vmax:       0.76 m/s AV Area (Vmean):   1.48 cm      PV Peak grad:  2.3 mmHg AV Area (VTI):     1.32 cm AV Vmax:  312.00 cm/s AV Vmean:          226.000 cm/s AV VTI:            0.620 m AV Peak Grad:      38.9 mmHg AV Mean Grad:      23.0 mmHg LVOT Vmax:         87.50 cm/s LVOT Vmean:        68.000 cm/s LVOT VTI:          0.167 m LVOT/AV VTI ratio: 0.27  AORTA Ao Root diam: 3.80 cm Ao Asc diam:  3.30 cm MITRAL VALVE               TRICUSPID VALVE MV Area (PHT): 3.17 cm    TR Peak grad:   32.5 mmHg MV Decel Time: 239 msec    TR Vmax:        285.00 cm/s MV E velocity: 73.30 cm/s MV A velocity: 94.30 cm/s  SHUNTS MV E/A ratio:  0.78        Systemic VTI:  0.17 m MV A Prime:    11.7 cm/s   Systemic Diam: 2.50 cm Kate Sable MD Electronically signed by Kate Sable MD Signature Date/Time: 12/12/2021/12:54:42 PM    Final     Cardiac Studies   TTE 12/12/2021 1. Left ventricular ejection fraction, by estimation, is 60 to 65%. The  left ventricle has normal function. The left ventricle has no regional  wall motion abnormalities. There is mild left ventricular hypertrophy.  Left ventricular diastolic parameters  are consistent with Grade I diastolic dysfunction (impaired relaxation).   2. Right ventricular systolic function is low normal. The right  ventricular size is normal. There is normal pulmonary artery systolic  pressure.   3. The mitral valve is grossly normal. Mild mitral valve regurgitation.   4. The aortic valve is calcified. Aortic valve regurgitation is trivial.  Moderate aortic valve stenosis. Aortic valve area, by VTI measures 1.32  cm. Aortic valve mean gradient measures 23.0 mmHg. Aortic valve Vmax  measures 3.12 m/s.   5. Aortic dilatation noted. There is borderline dilatation of the aortic  root, measuring 38 mm.  Patient Profile     89 y.o. male   with a history of HFpEF, atrial fibrillation on Eliquis, aortic stenosis, hyperlipidemia, prostate cancer s/p prostatectomy, chronic indwelling Foley catheter who presents due to weakness and fatigue, diagnosed with UTI and pneumonia.  Being seen for CHF.  Assessment & Plan    HFpEF LLE - BNP 585 and CXR with pulmonary edema - IV lasix transitioned to lasix 40mg  daily - Net -3.5L - kidney function stable - euvolemic on exam - continue BB   Anemia - Hgb 7.7 today - Eliquis discontinued - per IM - on oral iron   Afib - Eliquis held for anemia/fall risk, can re-evaluate a/c benefit and risks as OP once he has recovered from this acute issue - Maintaining NSR - continue rate control with Toprol XL 12.5mg  daily   UTI - with weakness and fatigue - was also being treated for possible PNA - per IM  For questions or updates, please contact [facility] Please consult [email or web address] for contact info under        Signed, Jordis Repetto Ninfa Meeker, PA-C  12/14/2021, 8:16 AM

## 2021-12-14 NOTE — Progress Notes (Signed)
PROGRESS NOTE    [name]  LNL:[id number] DOB: 1933/12/23 DOA: 12/11/2021 PCP: [name], MD    Brief Narrative:  [name] is a 88 y.o. male with medical history significant for anxiety, asthma, CAD, CHF, CKD, who presented to the ER with acute onset of generalized weakness and fatigue.  He admitted to recent dry cough and stated that he has been recently treated for pneumonia few weeks ago.  Chest x-ray showed evidence of pulmonary edema, questionable pneumonia.  Patient was giving IV Lasix, antibiotics. 1/15.  Pneumonia ruled out, continue IV Lasix.   Assessment & Plan:   Active Problems:   Acute kidney injury superimposed on CKD (HCC)   Acute on chronic diastolic heart failure (HCC)   Generalized weakness   Acute heart failure with preserved ejection fraction (HCC)   Atrial fibrillation (HCC)   Iron deficiency anemia     Acute on chronic diastolic congestive heart failure. Moderate aortic stenosis. Reviewed echocardiogram, ejection fraction 60 to 65% with moderate aortic stenosis. Patient is followed by cardiology, started IV Lasix.  Volume status much better today, cardiology has transition to oral Lasix.  Currently pending for nursing home placement.     Generalized weakness. Iron deficient anemia. New iron supplement orally.   Acute kidney injury. Currently patient renal function is normal.  Paroxysmal atrial fibrillation. Cardiology has discontinued Eliquis due to concern of fall risk and a drop in hemoglobin.  Indwelling Foley catheter.  Insomnia. DC trazodone, started lower dose Seroquel, patient slept much better.  Please carryover Seroquel to nursing home.   DVT prophylaxis: Eliquis Code Status: full Family Communication:  Disposition Plan: Pending nursing home placement..   Status is: Inpatient  Remains inpatient appropriate because: Unsafe discharge.        I/O last 3 completed shifts: In: 100 [P.O.:100] Out: 1200  [Urine:1200] No intake/output data recorded.     Consultants:  cardiology  Procedures: None  Antimicrobials: None  Subjective: Feel much better today.  He had a good night sleep, does not feel short of breath. Still significant weakness,. No abdominal pain nausea vomiting No dysuria hematuria.  Objective: Vitals:   12/14/21 0500 12/14/21 0621 12/14/21 0730 12/14/21 1144  BP:  (!) 127/44 (!) 133/50 (!) 115/51  Pulse:  76 82 82  Resp:  16 18 17   Temp:  98.8 F (37.1 C) 98.1 F (36.7 C) 98.1 F (36.7 C)  TempSrc:  Oral    SpO2:  93% 93% 92%  Weight: 92.6 kg     Height:        Intake/Output Summary (Last 24 hours) at 12/14/2021 1345 Last data filed at 12/14/2021 0650 Gross per 24 hour  Intake 100 ml  Output 300 ml  Net -200 ml   Filed Weights   12/12/21 0500 12/13/21 0638 12/14/21 0500  Weight: 93.6 kg 91.2 kg 92.6 kg    Examination:  General exam: Appears calm and comfortable  Respiratory system: A few crackles in the base. Respiratory effort normal. Cardiovascular system: S1 & S2 heard, RRR. No JVD, murmurs, rubs, gallops or clicks. No pedal edema. Gastrointestinal system: Abdomen is nondistended, soft and nontender. No organomegaly or masses felt. Normal bowel sounds heard. Central nervous system: Alert and oriented. No focal neurological deficits. Extremities: Symmetric 5 x 5 power. Skin: No rashes, lesions or ulcers Psychiatry: Judgement and insight appear normal. Mood & affect appropriate.     Data Reviewed: I have personally reviewed following labs and imaging studies  CBC: Recent Labs  Lab 12/10/21 1639 12/11/21 0547 12/12/21 0451 12/13/21 0545 12/14/21 0506  WBC 6.2 4.7 4.1 4.1 3.7*  NEUTROABS 4.9  --  2.9 2.8 2.3  HGB 9.2* 7.4* 7.8* 7.4* 7.7*  HCT 28.8* 23.0* 24.2* 22.7* 23.4*  MCV 102.1* 100.9* 99.2 97.8 99.2  PLT 209 194 209 229 403   Basic Metabolic Panel: Recent Labs  Lab 12/10/21 1639 12/11/21 0547 12/12/21 0451 12/13/21 0545  12/14/21 0506  NA 141 136 139 138 136  K 4.0 3.6 3.5 3.6 3.7  CL 99 98 99 97* 97*  CO2 33* 31 32 33* 33*  GLUCOSE 95 103* 98 102* 98  BUN 28* 24* 20 19 20   CREATININE 1.41* 1.16 1.00 1.20 1.23  CALCIUM 8.4* 7.7* 7.9* 8.0* 7.8*  MG  --   --  2.0 1.9 2.0   GFR: Estimated Creatinine Clearance: 49.4 mL/min (by C-G formula based on SCr of 1.23 mg/dL). Liver Function Tests: Recent Labs  Lab 12/10/21 1639  AST 19  ALT 11  ALKPHOS 56  BILITOT 0.6  PROT 6.2*  ALBUMIN 3.2*   No results for input(s): LIPASE, AMYLASE in the last 168 hours. No results for input(s): AMMONIA in the last 168 hours. Coagulation Profile: No results for input(s): INR, PROTIME in the last 168 hours. Cardiac Enzymes: No results for input(s): CKTOTAL, CKMB, CKMBINDEX, TROPONINI in the last 168 hours. BNP (last 3 results) No results for input(s): PROBNP in the last 8760 hours. HbA1C: No results for input(s): HGBA1C in the last 72 hours. CBG: No results for input(s): GLUCAP in the last 168 hours. Lipid Profile: No results for input(s): CHOL, HDL, LDLCALC, TRIG, CHOLHDL, LDLDIRECT in the last 72 hours. Thyroid Function Tests: No results for input(s): TSH, T4TOTAL, FREET4, T3FREE, THYROIDAB in the last 72 hours. Anemia Panel: Recent Labs    12/11/21 1522  VITAMINB12 1,514*   Sepsis Labs: Recent Labs  Lab 12/10/21 1647 12/11/21 0300 12/11/21 0547 12/12/21 0451  PROCALCITON 0.33  --  0.19 0.17  LATICACIDVEN  --  1.0  --   --     Recent Results (from the past 240 hour(s))  Urine Culture     Status: Abnormal   Collection Time: 12/10/21  9:30 PM   Specimen: Urine, Random  Result Value Ref Range Status   Specimen Description   Final    URINE, RANDOM Performed at [facility], [address] [place], [place] [address]    Special Requests   Final    NONE Performed at [facility], [address] [place], [place] [address]    Culture MULTIPLE SPECIES PRESENT, SUGGEST  RECOLLECTION (A)  Final   Report Status 12/12/2021 FINAL  Final  Resp Panel by RT-PCR (Flu A&B, Covid) Nasopharyngeal Swab     Status: None   Collection Time: 12/11/21  3:00 AM   Specimen: Nasopharyngeal Swab; Nasopharyngeal(NP) swabs in vial transport medium  Result Value Ref Range Status   SARS Coronavirus 2 by RT PCR NEGATIVE NEGATIVE Final    Comment: (NOTE) SARS-CoV-2 target nucleic acids are NOT DETECTED.  The SARS-CoV-2 RNA is generally detectable in upper respiratory specimens during the acute phase of infection. The lowest concentration of SARS-CoV-2 viral copies this assay can detect is 138 copies/mL. A negative result does not preclude SARS-Cov-2 infection and should not be used as the sole basis for treatment or other patient management decisions. A negative result may occur with  improper specimen collection/handling, submission of specimen other than nasopharyngeal swab, presence of  viral mutation(s) within the areas targeted by this assay, and inadequate number of viral copies(<138 copies/mL). A negative result must be combined with clinical observations, patient history, and epidemiological information. The expected result is Negative.  Fact Sheet for Patients:  EntrepreneurPulse.com.au  Fact Sheet for Healthcare Providers:  IncredibleEmployment.be  This test is no t yet approved or cleared by the [place] FDA and  has been authorized for detection and/or diagnosis of SARS-CoV-2 by FDA under an Emergency Use Authorization (EUA). This EUA will remain  in effect (meaning this test can be used) for the duration of the COVID-19 declaration under Section 564(b)(1) of the Act, 21 U.S.C.section 360bbb-3(b)(1), unless the authorization is terminated  or revoked sooner.       Influenza A by PCR NEGATIVE NEGATIVE Final   Influenza B by PCR NEGATIVE NEGATIVE Final    Comment: (NOTE) The Xpert Xpress SARS-CoV-2/FLU/RSV plus assay  is intended as an aid in the diagnosis of influenza from Nasopharyngeal swab specimens and should not be used as a sole basis for treatment. Nasal washings and aspirates are unacceptable for Xpert Xpress SARS-CoV-2/FLU/RSV testing.  Fact Sheet for Patients: EntrepreneurPulse.com.au  Fact Sheet for Healthcare Providers: IncredibleEmployment.be  This test is not yet approved or cleared by the [place] FDA and has been authorized for detection and/or diagnosis of SARS-CoV-2 by FDA under an Emergency Use Authorization (EUA). This EUA will remain in effect (meaning this test can be used) for the duration of the COVID-19 declaration under Section 564(b)(1) of the Act, 21 U.S.C. section 360bbb-3(b)(1), unless the authorization is terminated or revoked.  Performed at [facility], [address] [place], [place] [address]   Culture, blood (Routine X 2) w Reflex to ID Panel     Status: None (Preliminary result)   Collection Time: 12/11/21  3:00 AM   Specimen: BLOOD  Result Value Ref Range Status   Specimen Description BLOOD BLOOD LEFT FOREARM  Final   Special Requests   Final    BOTTLES DRAWN AEROBIC AND ANAEROBIC Blood Culture results may not be optimal due to an inadequate volume of blood received in culture bottles   Culture   Final    NO GROWTH 3 DAYS Performed at [facility], [address] [place], [place] [address]    Report Status PENDING  Incomplete  Culture, blood (Routine X 2) w Reflex to ID Panel     Status: None (Preliminary result)   Collection Time: 12/11/21  3:00 AM   Specimen: BLOOD  Result Value Ref Range Status   Specimen Description BLOOD LEFT ANTECUBITAL  Final   Special Requests   Final    BOTTLES DRAWN AEROBIC AND ANAEROBIC Blood Culture results may not be optimal due to an inadequate volume of blood received in culture bottles   Culture   Final    NO GROWTH 3 DAYS Performed at [facility], [address] [place], [place] [address]    Report Status PENDING  Incomplete         Radiology Studies: No results found.      Scheduled Meds:  acidophilus  1 capsule Oral Daily   ALPRAZolam  0.5 mg Oral QHS   vitamin C  1,000 mg Oral Daily   fluconazole  100 mg Oral Daily   fluticasone  2 spray Each Nare BID   folic acid  2,956 mcg Oral Daily   furosemide  40 mg Oral Daily   gabapentin  600 mg Oral BID AC &  HS   guaiFENesin  600 mg Oral BID   hydroxychloroquine  200 mg Oral BID   iron polysaccharides  150 mg Oral Daily   loratadine  10 mg Oral Daily   [START ON 12/15/2021] methotrexate  25 mg Oral Weekly   metoprolol succinate  12.5 mg Oral BID   mirabegron ER  50 mg Oral Daily   multivitamin with minerals  1 tablet Oral Daily   pantoprazole  40 mg Oral Daily   polyethylene glycol  17 g Oral QHS   pravastatin  20 mg Oral q1800   QUEtiapine  25 mg Oral QHS   Continuous Infusions:   LOS: 3 days    Time spent: 28 minutes    Sharen Hones, MD [facility]   To contact the attending provider between 7A-7P or the covering provider during after hours 7P-7A, please log into the web site [email or web address] and access using universal [place] password for that web site. If you do not have the password, please call the hospital operator.  12/14/2021, 1:45 PM

## 2021-12-14 NOTE — TOC Progression Note (Addendum)
Transition of Care [facility]) - Progression Note    Patient Details  Name: [name] MRN: [id number] Date of Birth: 1934/03/27  Transition of Care [facility]) CM/SW [place], RN Phone Number: 12/14/2021, 1:46 PM  Clinical Narrative:   I reached out to [place] at [facility] inquiring if they will be able to make a bed offer, she stated that they need me to resend the referral and asked to email it to [email or web address], I emailed the clinical notes, awaiting a call back with a bed offer, no offers at this time         Expected Discharge Plan and Services                                                 Social Determinants of Health (SDOH) Interventions    Readmission Risk Interventions Readmission Risk Prevention Plan 09/12/2021 06/23/2019  Transportation Screening Complete Complete  PCP or Specialist Appt within 3-5 Days - Complete  HRI or [place] - Complete  Social Work Consult for [place] Planning/Counseling - Complete  Palliative Care Screening - Not Applicable  Medication Review Press photographer) Complete Complete  PCP or Specialist appointment within 3-5 days of discharge Complete -  [place] or Home Care Consult Complete -  SW Recovery Care/Counseling Consult Complete -  Palliative Care Screening Not Applicable -  Skilled Nursing Facility Complete -  Some recent data might be hidden

## 2021-12-14 NOTE — Progress Notes (Signed)
MD Roosevelt Locks made aware that patient removed peripheral IV, not necessary to replace at this time. Will continue to monitor

## 2021-12-14 NOTE — Progress Notes (Signed)
Occupational Therapy Treatment Patient Details Name: CANYON LOHR MRN: [id number] DOB: Apr 29, 1934 Today's Date: 12/14/2021   History of present illness 88 y.o. male with medical history significant for anxiety, asthma, CAD, CHF, CKD, who presented to the ER with acute onset of generalized weakness and fatigue.  He admitted to recent dry cough and stated that he has been recently treated for pneumonia few weeks ago. Pt admitted for acute on chronic diastolic congestive heart failure.   OT comments  Pt seen for OT/PT co-treatment on this date. Upon arrival to room, pt awake and sitting upright in bed. Pt agreeable and highly motivated to participate in tx this date. Pt currently presents with decreased AROM of b/l shoulders, decreased strength, and activity tolerance. Due to these functional impairments, pt requires MOD A+2 for bed mobility, SUPERVISION/SET-UP for seated grooming tasks, MAX A for UB/LB dressing while seated EOB, and MOD A+2 for sit>stand transfer (+3 for sit>stand peri-care). Pt is making good progress toward goals and continues to benefit from skilled OT services to maximize return to PLOF and minimize risk of future falls, injury, caregiver burden, and readmission. Will continue to follow POC. Discharge recommendation remains appropriate.     Recommendations for follow up therapy are one component of a multi-disciplinary discharge planning process, led by the attending physician.  Recommendations may be updated based on patient status, additional functional criteria and insurance authorization.    Follow Up Recommendations  Skilled nursing-short term rehab (<3 hours/day)    Assistance Recommended at Discharge Intermittent Supervision/Assistance  Patient can return home with the following  Two people to help with walking and/or transfers;Two people to help with bathing/dressing/bathroom   Equipment Recommendations  None recommended by OT       Precautions / Restrictions  Precautions Precautions: Fall Restrictions Weight Bearing Restrictions: No       Mobility Bed Mobility Overal bed mobility: Needs Assistance Bed Mobility: Supine to Sit, Sit to Supine     Supine to sit: Mod assist, +2 for safety/equipment Sit to supine: Mod assist, +2 for physical assistance   General bed mobility comments: cues for sequencing and assistance for LE's    Transfers Overall transfer level: Needs assistance Equipment used: Rolling walker (2 wheels) Transfers: Sit to/from Stand Sit to Stand: Mod assist, +2 physical assistance, From elevated surface                 Balance Overall balance assessment: Needs assistance Sitting-balance support: No upper extremity supported, Feet supported Sitting balance-[name]: Fair Sitting balance - Comments: Fair static sitting balance at EOB   Standing balance support: Bilateral upper extremity supported, During functional activity Standing balance-[name]: Poor Standing balance comment: Unable to step laterally today as pt unable to attain safe, upright static standing.                           ADL either performed or assessed with clinical judgement   ADL Overall ADL's : Needs assistance/impaired     Grooming: Wash/dry face;Sitting Grooming Details (indicate cue type and reason): to wash face with washcloth         Upper Body Dressing : Maximal assistance;Sitting Upper Body Dressing Details (indicate cue type and reason): to doff overhead sweatshirt Lower Body Dressing: Maximal assistance;Sitting/lateral leans Lower Body Dressing Details (indicate cue type and reason): to don/doff socks     Toileting- Clothing Manipulation and Hygiene: Moderate assistance;+2 for physical assistance;Sit to/from stand (+3 for peri-care) Toileting -  Clothing Manipulation Details (indicate cue type and reason): MOD A+2 for sit>stand transfer and additional +1 person for performing peri-care               Cognition Arousal/Alertness: Awake/alert Behavior During Therapy: WFL for tasks assessed/performed Overall Cognitive Status: Within Functional Limits for tasks assessed                                                General Comments Pt c/o of dizziness following supine>sit transfer. SpO2 85-88% on RA and BP 107/64. SpO2 increased to 92% while on 2L/min of O2    Pertinent Vitals/ Pain       Pain Assessment Pain Assessment: No/denies pain         Frequency  Min 2X/week        Progress Toward Goals  OT Goals(current goals can now be found in the care plan section)  Progress towards OT goals: Progressing toward goals  Acute Rehab OT Goals Patient Stated Goal: to regain independence OT Goal Formulation: With patient Time For Goal Achievement: 12/26/21 Potential to Achieve Goals: Good  Plan Discharge plan remains appropriate;Frequency remains appropriate    Co-evaluation    PT/OT/SLP Co-Evaluation/Treatment: Yes Reason for Co-Treatment: Necessary to address cognition/behavior during functional activity;For patient/therapist safety;To address functional/ADL transfers PT goals addressed during session: Mobility/safety with mobility;Balance OT goals addressed during session: ADL's and self-care      AM-PAC OT "6 Clicks" Daily Activity     Outcome Measure   Help from another person eating meals?: None Help from another person taking care of personal grooming?: A Lot Help from another person toileting, which includes using toliet, bedpan, or urinal?: A Lot Help from another person bathing (including washing, rinsing, drying)?: A Lot Help from another person to put on and taking off regular upper body clothing?: A Little Help from another person to put on and taking off regular lower body clothing?: A Lot 6 Click Score: 15    End of Session Equipment Utilized During Treatment: Gait belt;Rolling walker (2 wheels)  OT Visit Diagnosis: Muscle  weakness (generalized) (M62.81);Unsteadiness on feet (R26.81)   Activity Tolerance Patient tolerated treatment well   Patient Left in bed;with call bell/phone within reach;with chair alarm set   Nurse Communication Mobility status        Time: 3716-9678 OT Time Calculation (min): 40 min  Charges: OT General Charges $OT Visit: 1 Visit OT Treatments $Self Care/Home Management : 8-22 mins  Fredirick Maudlin, OTR/L [place]

## 2021-12-14 NOTE — Plan of Care (Signed)
  Problem: Activity: Goal: Ability to tolerate increased activity will improve Outcome: Progressing   Problem: Clinical Measurements: Goal: Ability to maintain a body temperature in the normal range will improve Outcome: Progressing   

## 2021-12-14 NOTE — TOC Progression Note (Signed)
Transition of Care [facility]) - Progression Note    Patient Details  Name: [name] MRN: [id number] Date of Birth: 06/30/1934  Transition of Care [facility]) CM/SW [place], RN Phone Number: 12/14/2021, 2:52 PM  Clinical Narrative:   Suanne Marker with [facility] called, She requested an emailed referral, I sent the referral thru email to [email or web address], awaiting a bed offer to go to [facility], Will get Ins approval once gets a bed,          Expected Discharge Plan and Services                                                 Social Determinants of Health (SDOH) Interventions    Readmission Risk Interventions Readmission Risk Prevention Plan 09/12/2021 06/23/2019  Transportation Screening Complete Complete  PCP or Specialist Appt within 3-5 Days - Complete  HRI or [place] - Complete  Social Work Consult for [place] Planning/Counseling - Complete  Palliative Care Screening - Not Applicable  Medication Review Press photographer) Complete Complete  PCP or Specialist appointment within 3-5 days of discharge Complete -  [place] or Home Care Consult Complete -  SW Recovery Care/Counseling Consult Complete -  Palliative Care Screening Not Applicable -  Skilled Nursing Facility Complete -  Some recent data might be hidden

## 2021-12-15 ENCOUNTER — Inpatient Hospital Stay: Payer: Medicare PPO

## 2021-12-15 LAB — BASIC METABOLIC PANEL
Anion gap: 7 (ref 5–15)
BUN: 19 mg/dL (ref 8–23)
CO2: 32 mmol/L (ref 22–32)
Calcium: 8 mg/dL — ABNORMAL LOW (ref 8.9–10.3)
Chloride: 95 mmol/L — ABNORMAL LOW (ref 98–111)
Creatinine, Ser: 1.31 mg/dL — ABNORMAL HIGH (ref 0.61–1.24)
GFR, Estimated: 53 mL/min — ABNORMAL LOW (ref >60)
Glucose, Bld: 93 mg/dL (ref 70–99)
Potassium: 3.8 mmol/L (ref 3.5–5.1)
Sodium: 134 mmol/L — ABNORMAL LOW (ref 135–145)

## 2021-12-15 LAB — FOLATE: Folate: 29 ng/mL (ref >5.9)

## 2021-12-15 MED ORDER — ENOXAPARIN SODIUM 40 MG/0.4ML IJ SOSY
40.0000 mg | PREFILLED_SYRINGE | INTRAMUSCULAR | Status: DC
  Filled 2021-12-15: qty 0.4

## 2021-12-15 MED ORDER — DIPHENHYDRAMINE HCL 25 MG PO CAPS
25.0000 mg | ORAL_CAPSULE | Freq: Four times a day (QID) | ORAL | Status: DC | PRN
  Administered 2021-12-16: 25 mg via ORAL
  Filled 2021-12-15: qty 1

## 2021-12-15 NOTE — TOC Progression Note (Signed)
Transition of Care [facility]) - Progression Note    Patient Details  Name: [name] MRN: [id number] Date of Birth: 03-Jan-1934  Transition of Care [facility]) CM/SW [place], RN Phone Number: 12/15/2021, 2:01 PM  Clinical Narrative:   Called the patient's daughter and left a general Voice mail requesting a call back         Expected Discharge Plan and Services                                                 Social Determinants of Health (SDOH) Interventions    Readmission Risk Interventions Readmission Risk Prevention Plan 09/12/2021 06/23/2019  Transportation Screening Complete Complete  PCP or Specialist Appt within 3-5 Days - Complete  HRI or [place] - Complete  Social Work Consult for [place] Planning/Counseling - Complete  Palliative Care Screening - Not Applicable  Medication Review Press photographer) Complete Complete  PCP or Specialist appointment within 3-5 days of discharge Complete -  [place] or Home Care Consult Complete -  SW Recovery Care/Counseling Consult Complete -  Palliative Care Screening Not Applicable -  Skilled Nursing Facility Complete -  Some recent data might be hidden

## 2021-12-15 NOTE — TOC Progression Note (Addendum)
Transition of Care [facility]) - Progression Note    Patient Details  Name: LAWSEN ARNOTT MRN: [id number] Date of Birth: [birth year]  Transition of Care [facility]) CM/SW [place], RN Phone Number: 12/15/2021, 11:18 AM  Clinical Narrative:      Spoke with the patient's daughter [name] and reviewed the bed offers, she stated that he has been at both places in the past, I explained that he is medically stable and needs to pick a bed option.  I let the daughter Know that [facility] also made a be offer  She will speak to the patient and they will make a choice, I provided my contact information  Expected Discharge Plan and Services                                                 Social Determinants of Health (SDOH) Interventions    Readmission Risk Interventions Readmission Risk Prevention Plan 09/12/2021 06/23/2019  Transportation Screening Complete Complete  PCP or Specialist Appt within 3-5 Days - Complete  HRI or [place] - Complete  Social Work Consult for [place] Planning/Counseling - Complete  Palliative Care Screening - Not Applicable  Medication Review Press photographer) Complete Complete  PCP or Specialist appointment within 3-5 days of discharge Complete -  [place] or Home Care Consult Complete -  SW Recovery Care/Counseling Consult Complete -  Palliative Care Screening Not Applicable -  Skilled Nursing Facility Complete -  Some recent data might be hidden

## 2021-12-15 NOTE — TOC Progression Note (Signed)
Transition of Care [facility]) - Progression Note    Patient Details  Name: DAX MURGUIA MRN: [id number] Date of Birth: Apr 20, 1934  Transition of Care [facility]) CM/SW [place], RN Phone Number: 12/15/2021, 3:05 PM  Clinical Narrative:   Met with the daughter and she stated that she did review [facility] and they will not go there, I explained we needed a bed choice, She is speaking with the patient and will let me know the choice         Expected Discharge Plan and Services                                                 Social Determinants of Health (SDOH) Interventions    Readmission Risk Interventions Readmission Risk Prevention Plan 09/12/2021 06/23/2019  Transportation Screening Complete Complete  PCP or Specialist Appt within 3-5 Days - Complete  HRI or [place] - Complete  Social Work Consult for [place] Planning/Counseling - Complete  Palliative Care Screening - Not Applicable  Medication Review Press photographer) Complete Complete  PCP or Specialist appointment within 3-5 days of discharge Complete -  [place] or Home Care Consult Complete -  SW Recovery Care/Counseling Consult Complete -  Palliative Care Screening Not Applicable -  Skilled Nursing Facility Complete -  Some recent data might be hidden

## 2021-12-15 NOTE — Progress Notes (Signed)
[facility] Progress Note  Patient: [name]    HWT:[id number]  DOA: 12/11/2021     Date of Service: the patient was seen and examined on 12/15/2021  Chief Complaint  Patient presents with   Weakness   Brief hospital course: [name] is a 88 y.o. male with medical history significant for anxiety, asthma, CAD, CHF, CKD, who presented to the ER with acute onset of generalized weakness and fatigue.  He admitted to recent dry cough and stated that he has been recently treated for pneumonia few weeks ago.  Chest x-ray showed evidence of pulmonary edema, questionable pneumonia.  Patient was giving IV Lasix, antibiotics. 1/15.  Pneumonia ruled out, continue IV Lasix.   Assessment and Plan:  Acute on chronic diastolic congestive heart failure. Moderate aortic stenosis. Reviewed echocardiogram, ejection fraction 60 to 65% with moderate aortic stenosis. Patient is followed by cardiology, s/p IV Lasix.  Volume status much better, cardiology has transition to oral Lasix.  Currently pending for nursing home placement.     Generalized weakness. Iron deficient anemia. New iron supplement orally.   Acute kidney injury Creatinine 1.3, slightly elevated today US renal: Status post right nephrectomy. Left kidney shows no hydronephrosis. Small left renal cyst. Monitor renal functions daily, avoid nephrotoxic medications  Paroxysmal atrial fibrillation. Cardiology has discontinued Eliquis due to concern of fall risk and a drop in hemoglobin.  Indwelling Foley catheter.  Insomnia. DC trazodone, started lower dose Seroquel, patient slept much better.  Please carryover Seroquel to nursing home.   Body mass index is 30.15 kg/m.  Interventions:   Pressure Injury 10/05/21 Buttocks Mid Stage 1 -  Intact skin with non-blanchable redness of a localized area usually over a bony prominence. (Active)  10/05/21 1730  Location: Buttocks  Location Orientation: Mid  Staging: Stage 1 -   Intact skin with non-blanchable redness of a localized area usually over a bony prominence.  Wound Description (Comments):   Present on Admission: Yes     Pressure Injury 12/12/21 Buttocks Left (Active)  12/12/21 2012  Location: Buttocks  Location Orientation: Left  Staging:   Wound Description (Comments):   Present on Admission:      Diet: Heart healthy DVT Prophylaxis: Subcutaneous Lovenox   Advance goals of care discussion: Full code  Family Communication: family was not present at bedside, at the time of interview.  The pt provided permission to discuss medical plan with the family. Opportunity was given to ask question and all questions were answered satisfactorily.   Disposition:  Pt is from Home, admitted with volume overload, diastolic CHF, still has shortness of breath, which precludes a safe discharge. Discharge to SNF, when clinically stable, may require 1-2 more days.  Subjective: No significant overnight events, patient was having mild shortness of breath and still on 2 to 3 L oxygen via nasal cannula.  Patient denied any chest palpitation, no any other active issues.  Physical Exam: General:  alert oriented to time, place, and person.  Appear in no distress, affect appropriate Eyes: PERRLA ENT: Oral Mucosa Clear, moist  Neck: no JVD,  Cardiovascular: S1 and S2 Present, no Murmur,  Respiratory: good respiratory effort, Bilateral Air entry equal and Decreased, no Crackles, no wheezes Abdomen: Bowel Sound present, Soft and no tenderness,  Skin: no rashes Extremities: no Pedal edema, no calf tenderness Neurologic: without any new focal findings Gait not checked due to patient safety concerns  Vitals:   12/15/21 0700 12/15/21 0759 12/15/21 1155 12/15/21 1532  BP: Marland Kitchen)  112/41 (!) 125/47 (!) 103/35 (!) 100/47  Pulse: 70 71 70 72  Resp: (!) 30 20 20 18   Temp: 98.3 F (36.8 C) 97.8 F (36.6 C) 98.2 F (36.8 C) 98.8 F (37.1 C)  TempSrc: Oral     SpO2: 97% 97%  97% 95%  Weight:      Height:        Intake/Output Summary (Last 24 hours) at 12/15/2021 1538 Last data filed at 12/15/2021 0600 Gross per 24 hour  Intake 200 ml  Output 675 ml  Net -475 ml   Filed Weights   12/12/21 0500 12/13/21 0638 12/14/21 0500  Weight: 93.6 kg 91.2 kg 92.6 kg    Data Reviewed: I have personally reviewed and interpreted daily labs, tele strips, imagings as discussed above. I reviewed all nursing notes, pharmacy notes, vitals, pertinent old records I have discussed plan of care as described above with RN and patient/family.  CBC: Recent Labs  Lab 12/10/21 1639 12/11/21 0547 12/12/21 0451 12/13/21 0545 12/14/21 0506  WBC 6.2 4.7 4.1 4.1 3.7*  NEUTROABS 4.9  --  2.9 2.8 2.3  HGB 9.2* 7.4* 7.8* 7.4* 7.7*  HCT 28.8* 23.0* 24.2* 22.7* 23.4*  MCV 102.1* 100.9* 99.2 97.8 99.2  PLT 209 194 209 229 195   Basic Metabolic Panel: Recent Labs  Lab 12/11/21 0547 12/12/21 0451 12/13/21 0545 12/14/21 0506 12/15/21 0625  NA 136 139 138 136 134*  K 3.6 3.5 3.6 3.7 3.8  CL 98 99 97* 97* 95*  CO2 31 32 33* 33* 32  GLUCOSE 103* 98 102* 98 93  BUN 24* 20 19 20 19   CREATININE 1.16 1.00 1.20 1.23 1.31*  CALCIUM 7.7* 7.9* 8.0* 7.8* 8.0*  MG  --  2.0 1.9 2.0  --     Studies: US RENAL  Result Date: 12/15/2021 CLINICAL DATA:  Renal dysfunction EXAM: RENAL / URINARY TRACT ULTRASOUND COMPLETE COMPARISON:  None. FINDINGS: Right Kidney: Not seen consistent with previous right nephrectomy. Left Kidney: Renal measurements: 12.2 x 5.5 x 4.9 cm = volume: 171 mL. There is no hydronephrosis. There is 11 mm cyst in the lateral margin of left kidney. Bladder: Indwelling Foley catheter is seen. Urinary bladder is not distended and not adequately evaluated. Other: None. IMPRESSION: Status post right nephrectomy. Left kidney shows no hydronephrosis. Small left renal cyst. Electronically Signed   By: Elmer Picker M.D.   On: 12/15/2021 13:08    Scheduled Meds:  acidophilus   1 capsule Oral Daily   ALPRAZolam  0.5 mg Oral QHS   vitamin C  1,000 mg Oral Daily   fluconazole  100 mg Oral Daily   fluticasone  2 spray Each Nare BID   folic acid  0,932 mcg Oral Daily   furosemide  40 mg Oral Daily   gabapentin  600 mg Oral BID AC & HS   guaiFENesin  600 mg Oral BID   hydroxychloroquine  200 mg Oral BID   iron polysaccharides  150 mg Oral Daily   loratadine  10 mg Oral Daily   methotrexate  25 mg Oral Weekly   metoprolol succinate  12.5 mg Oral BID   mirabegron ER  50 mg Oral Daily   multivitamin with minerals  1 tablet Oral Daily   pantoprazole  40 mg Oral Daily   polyethylene glycol  17 g Oral QHS   pravastatin  20 mg Oral q1800   QUEtiapine  25 mg Oral QHS   Continuous Infusions: PRN Meds: acetaminophen **OR**  acetaminophen, albuterol, diphenhydrAMINE, EPINEPHrine, magnesium hydroxide, ondansetron **OR** ondansetron (ZOFRAN) IV, oxyCODONE-acetaminophen  Time spent: 35 minutes  Author: Val Riles. MD [facility] 12/15/2021 3:38 PM  To reach On-call, see care teams to locate the attending and reach out to them via [email or web address]. If 7PM-7AM, please contact night-coverage If you still have difficulty reaching the attending provider, please page the [facility] (Director on Call) for [facility] on amion for assistance.

## 2021-12-15 NOTE — TOC Progression Note (Signed)
Transition of Care [facility]) - Progression Note    Patient Details  Name: HERSHEY KNAUER MRN: [id number] Date of Birth: [birth year]  Transition of Care [facility]) CM/SW [place], RN Phone Number: 12/15/2021, 10:23 AM  Clinical Narrative:   Daughter reached out and requested that we look for STR SNF bed in [place] area, She reviewed [facility] and it was not acceptable, I sent the bedsearch in the [facility] and am awaiting bed offers.         Expected Discharge Plan and Services                                                 Social Determinants of Health (SDOH) Interventions    Readmission Risk Interventions Readmission Risk Prevention Plan 09/12/2021 06/23/2019  Transportation Screening Complete Complete  PCP or Specialist Appt within 3-5 Days - Complete  HRI or [place] - Complete  Social Work Consult for [place] Planning/Counseling - Complete  Palliative Care Screening - Not Applicable  Medication Review Press photographer) Complete Complete  PCP or Specialist appointment within 3-5 days of discharge Complete -  [place] or Home Care Consult Complete -  SW Recovery Care/Counseling Consult Complete -  Palliative Care Screening Not Applicable -  Skilled Nursing Facility Complete -  Some recent data might be hidden

## 2021-12-15 NOTE — TOC Progression Note (Addendum)
Transition of Care [facility]) - Progression Note    Patient Details  Name: [name] MRN: [id number] Date of Birth: 1934/08/06  Transition of Care [facility]) CM/SW [place], RN Phone Number: 12/15/2021, 4:02 PM  Clinical Narrative:   Spoke with the patient and his daughter, they [facility], I [facility], [employer] started, Auth pending ref number [id number]         Expected Discharge Plan and Services                                                 Social Determinants of Health (SDOH) Interventions    Readmission Risk Interventions Readmission Risk Prevention Plan 09/12/2021 06/23/2019  Transportation Screening Complete Complete  PCP or Specialist Appt within 3-5 Days - Complete  HRI or [place] - Complete  Social Work Consult for [place] Planning/Counseling - Complete  Palliative Care Screening - Not Applicable  Medication Review Press photographer) Complete Complete  PCP or Specialist appointment within 3-5 days of discharge Complete -  [place] or Home Care Consult Complete -  SW Recovery Care/Counseling Consult Complete -  Palliative Care Screening Not Applicable -  [place] Complete -  Some recent data might be hidden

## 2021-12-15 NOTE — Progress Notes (Signed)
Progress Note  Patient Name: [name] Date of Encounter: 12/15/2021  [facility] Cardiologist: Kathlyn [place], MD   Subjective   Sleepy during exam. No chest pain or SOB. Awaiting nursing home placement.   Inpatient Medications    Scheduled Meds:  acidophilus  1 capsule Oral Daily   ALPRAZolam  0.5 mg Oral QHS   vitamin C  1,000 mg Oral Daily   fluconazole  100 mg Oral Daily   fluticasone  2 spray Each Nare BID   folic acid  7,124 mcg Oral Daily   furosemide  40 mg Oral Daily   gabapentin  600 mg Oral BID AC & HS   guaiFENesin  600 mg Oral BID   hydroxychloroquine  200 mg Oral BID   iron polysaccharides  150 mg Oral Daily   loratadine  10 mg Oral Daily   methotrexate  25 mg Oral Weekly   metoprolol succinate  12.5 mg Oral BID   mirabegron ER  50 mg Oral Daily   multivitamin with minerals  1 tablet Oral Daily   pantoprazole  40 mg Oral Daily   polyethylene glycol  17 g Oral QHS   pravastatin  20 mg Oral q1800   QUEtiapine  25 mg Oral QHS   Continuous Infusions:  PRN Meds: acetaminophen **OR** acetaminophen, albuterol, diphenhydrAMINE, EPINEPHrine, magnesium hydroxide, ondansetron **OR** ondansetron (ZOFRAN) IV, oxyCODONE-acetaminophen   Vital Signs    Vitals:   12/14/21 2026 12/15/21 0600 12/15/21 0700 12/15/21 0759  BP: (!) 116/46 (!) 120/47 (!) 112/41 (!) 125/47  Pulse: 75 77 70 71  Resp: 16 (!) 24 (!) 30 20  Temp: 98.7 F (37.1 C) 98.4 F (36.9 C) 98.3 F (36.8 C) 97.8 F (36.6 C)  TempSrc:  Oral Oral   SpO2: 94% 94% 97% 97%  Weight:      Height:        Intake/Output Summary (Last 24 hours) at 12/15/2021 0836 Last data filed at 12/15/2021 0600 Gross per 24 hour  Intake 200 ml  Output 675 ml  Net -475 ml   Last 3 Weights 12/14/2021 12/13/2021 12/12/2021  Weight (lbs) 204 lb 2.3 oz 201 lb 1.6 oz 206 lb 5.6 oz  Weight (kg) 92.6 kg 91.218 kg 93.6 kg  Some encounter information is confidential and restricted. Go to Review Flowsheets activity to  see all data.      Telemetry    N/A - Personally Reviewed  ECG    No knew - Personally Reviewed  Physical Exam   GEN: No acute distress.   Neck: No JVD Cardiac: RRR, no murmurs, rubs, or gallops.  Respiratory: Clear to auscultation bilaterally. GI: Soft, nontender, non-distended  MS: No edema; No deformity. Neuro:  Nonfocal  Psych: Normal affect   Labs    High Sensitivity Troponin:   Recent Labs  Lab 12/10/21 1639 12/11/21 0300  TROPONINIHS 32* 22*     Chemistry Recent Labs  Lab 12/10/21 1639 12/11/21 0547 12/12/21 0451 12/13/21 0545 12/14/21 0506 12/15/21 0625  NA 141   < > 139 138 136 134*  K 4.0   < > 3.5 3.6 3.7 3.8  CL 99   < > 99 97* 97* 95*  CO2 33*   < > 32 33* 33* 32  GLUCOSE 95   < > 98 102* 98 93  BUN 28*   < > 20 19 20 19   CREATININE 1.41*   < > 1.00 1.20 1.23 1.31*  CALCIUM 8.4*   < >  7.9* 8.0* 7.8* 8.0*  MG  --   --  2.0 1.9 2.0  --   PROT 6.2*  --   --   --   --   --   ALBUMIN 3.2*  --   --   --   --   --   AST 19  --   --   --   --   --   ALT 11  --   --   --   --   --   ALKPHOS 56  --   --   --   --   --   BILITOT 0.6  --   --   --   --   --   GFRNONAA 49*   < > >60 59* 58* 53*  ANIONGAP 9   < > 8 8 6 7    < > = values in this interval not displayed.    Lipids No results for input(s): CHOL, TRIG, HDL, LABVLDL, LDLCALC, CHOLHDL in the last 168 hours.  Hematology Recent Labs  Lab 12/12/21 0451 12/13/21 0545 12/14/21 0506  WBC 4.1 4.1 3.7*  RBC 2.44* 2.32* 2.36*  HGB 7.8* 7.4* 7.7*  HCT 24.2* 22.7* 23.4*  MCV 99.2 97.8 99.2  MCH 32.0 31.9 32.6  MCHC 32.2 32.6 32.9  RDW 15.6* 15.5 15.3  PLT 209 229 234   Thyroid No results for input(s): TSH, FREET4 in the last 168 hours.  BNP Recent Labs  Lab 12/10/21 1647  BNP 585.0*    DDimer No results for input(s): DDIMER in the last 168 hours.   Radiology    No results found.  Cardiac Studies   TTE 12/12/2021 1. Left ventricular ejection fraction, by estimation, is 60 to 65%.  The  left ventricle has normal function. The left ventricle has no regional  wall motion abnormalities. There is mild left ventricular hypertrophy.  Left ventricular diastolic parameters  are consistent with Grade I diastolic dysfunction (impaired relaxation).   2. Right ventricular systolic function is low normal. The right  ventricular size is normal. There is normal pulmonary artery systolic  pressure.   3. The mitral valve is grossly normal. Mild mitral valve regurgitation.   4. The aortic valve is calcified. Aortic valve regurgitation is trivial.  Moderate aortic valve stenosis. Aortic valve area, by VTI measures 1.32  cm. Aortic valve mean gradient measures 23.0 mmHg. Aortic valve Vmax  measures 3.12 m/s.   5. Aortic dilatation noted. There is borderline dilatation of the aortic  root, measuring 38 mm.  Patient Profile     88 y.o. male  with a history of HFpEF, atrial fibrillation on Eliquis, aortic stenosis, hyperlipidemia, prostate cancer s/p prostatectomy, chronic indwelling Foley catheter who presents due to weakness and fatigue, diagnosed with UTI and pneumonia.  Being seen for CHF.  Assessment & Plan    HFpEF LLE - BNP 585 and CXR with pulmonary edema - euvolemic on exam - continue lasix 40mg  daily - Net -3.5L - kidney function stable - continue BB   Anemia - Hgb 7.7 on 1/17, AM labs pending - Eliquis discontinued - on oral iron   Afib - Eliquis held for anemia/fall risk, can re-evaluate a/c benefit and risks as OP once he has recovered from this acute issue - Maintaining NSR - continue rate control with Toprol XL 12.5mg  daily   UTI - with weakness and fatigue - was also being treated for possible PNA - per IM    For questions  or updates, please contact [place] Please consult [email or web address] for contact info under        Signed, Naod Sweetland Ninfa Meeker, PA-C  12/15/2021, 8:36 AM

## 2021-12-15 NOTE — TOC Progression Note (Signed)
Transition of Care [facility]) - Progression Note    Patient Details  Name: NATHANYEL DEFENBAUGH MRN: [id number] Date of Birth: [birth year]  Transition of Care [facility]) CM/SW [place], RN Phone Number: 12/15/2021, 2:58 PM  Clinical Narrative:   Called the patients daughter [name] to obtain bed choice, unable to reach, left a general VM for a call back         Expected Discharge Plan and Services                                                 Social Determinants of Health (SDOH) Interventions    Readmission Risk Interventions Readmission Risk Prevention Plan 09/12/2021 06/23/2019  Transportation Screening Complete Complete  PCP or Specialist Appt within 3-5 Days - Complete  HRI or [place] - Complete  Social Work Consult for [place] Planning/Counseling - Complete  Palliative Care Screening - Not Applicable  Medication Review Press photographer) Complete Complete  PCP or Specialist appointment within 3-5 days of discharge Complete -  [place] or Home Care Consult Complete -  SW Recovery Care/Counseling Consult Complete -  Palliative Care Screening Not Applicable -  Skilled Nursing Facility Complete -  Some recent data might be hidden

## 2021-12-15 NOTE — Plan of Care (Signed)
°  Problem: Education: Goal: Ability to demonstrate management of disease process will improve Outcome: Progressing Goal: Ability to verbalize understanding of medication therapies will improve Outcome: Progressing Goal: Individualized Educational Video(s) Outcome: Progressing   Problem: Activity: Goal: Capacity to carry out activities will improve Outcome: Progressing   Problem: Cardiac: Goal: Ability to achieve and maintain adequate cardiopulmonary perfusion will improve Outcome: Progressing   Problem: Activity: Goal: Ability to tolerate increased activity will improve Outcome: Progressing   Problem: Respiratory: Goal: Ability to maintain adequate ventilation will improve Outcome: Progressing Goal: Ability to maintain a clear airway will improve Outcome: Progressing   Problem: Health Behavior/Discharge Planning: Goal: Ability to manage health-related needs will improve Outcome: Progressing   Problem: Education: Goal: Knowledge of General Education information will improve Description: Including pain rating scale, medication(s)/side effects and non-pharmacologic comfort measures Outcome: Progressing   Problem: Activity: Goal: Risk for activity intolerance will decrease Outcome: Progressing   Problem: Nutrition: Goal: Adequate nutrition will be maintained Outcome: Progressing   Problem: Coping: Goal: Level of anxiety will decrease Outcome: Progressing

## 2021-12-16 DIAGNOSIS — Z201 Contact with and (suspected) exposure to tuberculosis: Secondary | ICD-10-CM | POA: Diagnosis not present

## 2021-12-16 DIAGNOSIS — I509 Heart failure, unspecified: Secondary | ICD-10-CM | POA: Diagnosis not present

## 2021-12-16 DIAGNOSIS — Z978 Presence of other specified devices: Secondary | ICD-10-CM | POA: Diagnosis not present

## 2021-12-16 DIAGNOSIS — C641 Malignant neoplasm of right kidney, except renal pelvis: Secondary | ICD-10-CM | POA: Diagnosis present

## 2021-12-16 DIAGNOSIS — J4550 Severe persistent asthma, uncomplicated: Secondary | ICD-10-CM | POA: Diagnosis not present

## 2021-12-16 DIAGNOSIS — I4891 Unspecified atrial fibrillation: Secondary | ICD-10-CM | POA: Diagnosis not present

## 2021-12-16 DIAGNOSIS — Z7401 Bed confinement status: Secondary | ICD-10-CM | POA: Diagnosis not present

## 2021-12-16 DIAGNOSIS — N179 Acute kidney failure, unspecified: Secondary | ICD-10-CM | POA: Diagnosis not present

## 2021-12-16 DIAGNOSIS — M25561 Pain in right knee: Secondary | ICD-10-CM | POA: Diagnosis not present

## 2021-12-16 DIAGNOSIS — I2510 Atherosclerotic heart disease of native coronary artery without angina pectoris: Secondary | ICD-10-CM | POA: Diagnosis not present

## 2021-12-16 DIAGNOSIS — R52 Pain, unspecified: Secondary | ICD-10-CM | POA: Diagnosis not present

## 2021-12-16 DIAGNOSIS — N189 Chronic kidney disease, unspecified: Secondary | ICD-10-CM | POA: Diagnosis not present

## 2021-12-16 DIAGNOSIS — I959 Hypotension, unspecified: Secondary | ICD-10-CM | POA: Diagnosis not present

## 2021-12-16 DIAGNOSIS — M25572 Pain in left ankle and joints of left foot: Secondary | ICD-10-CM | POA: Diagnosis not present

## 2021-12-16 DIAGNOSIS — R1030 Lower abdominal pain, unspecified: Secondary | ICD-10-CM | POA: Diagnosis not present

## 2021-12-16 DIAGNOSIS — I10 Essential (primary) hypertension: Secondary | ICD-10-CM | POA: Diagnosis not present

## 2021-12-16 DIAGNOSIS — J449 Chronic obstructive pulmonary disease, unspecified: Secondary | ICD-10-CM | POA: Diagnosis not present

## 2021-12-16 DIAGNOSIS — K5900 Constipation, unspecified: Secondary | ICD-10-CM | POA: Diagnosis not present

## 2021-12-16 DIAGNOSIS — D509 Iron deficiency anemia, unspecified: Secondary | ICD-10-CM | POA: Diagnosis not present

## 2021-12-16 DIAGNOSIS — I5031 Acute diastolic (congestive) heart failure: Secondary | ICD-10-CM | POA: Diagnosis not present

## 2021-12-16 DIAGNOSIS — G4700 Insomnia, unspecified: Secondary | ICD-10-CM | POA: Diagnosis not present

## 2021-12-16 DIAGNOSIS — J90 Pleural effusion, not elsewhere classified: Secondary | ICD-10-CM | POA: Diagnosis not present

## 2021-12-16 DIAGNOSIS — G903 Multi-system degeneration of the autonomic nervous system: Secondary | ICD-10-CM | POA: Diagnosis not present

## 2021-12-16 DIAGNOSIS — L299 Pruritus, unspecified: Secondary | ICD-10-CM | POA: Diagnosis not present

## 2021-12-16 DIAGNOSIS — C61 Malignant neoplasm of prostate: Secondary | ICD-10-CM | POA: Diagnosis not present

## 2021-12-16 DIAGNOSIS — L89302 Pressure ulcer of unspecified buttock, stage 2: Secondary | ICD-10-CM | POA: Diagnosis not present

## 2021-12-16 DIAGNOSIS — D500 Iron deficiency anemia secondary to blood loss (chronic): Secondary | ICD-10-CM | POA: Diagnosis not present

## 2021-12-16 DIAGNOSIS — J189 Pneumonia, unspecified organism: Secondary | ICD-10-CM | POA: Diagnosis not present

## 2021-12-16 DIAGNOSIS — J45909 Unspecified asthma, uncomplicated: Secondary | ICD-10-CM | POA: Diagnosis not present

## 2021-12-16 DIAGNOSIS — R109 Unspecified abdominal pain: Secondary | ICD-10-CM | POA: Diagnosis not present

## 2021-12-16 DIAGNOSIS — I501 Left ventricular failure: Secondary | ICD-10-CM | POA: Diagnosis not present

## 2021-12-16 DIAGNOSIS — R339 Retention of urine, unspecified: Secondary | ICD-10-CM | POA: Diagnosis not present

## 2021-12-16 DIAGNOSIS — J4551 Severe persistent asthma with (acute) exacerbation: Secondary | ICD-10-CM | POA: Diagnosis not present

## 2021-12-16 DIAGNOSIS — I4819 Other persistent atrial fibrillation: Secondary | ICD-10-CM

## 2021-12-16 DIAGNOSIS — R531 Weakness: Secondary | ICD-10-CM | POA: Diagnosis not present

## 2021-12-16 DIAGNOSIS — N319 Neuromuscular dysfunction of bladder, unspecified: Secondary | ICD-10-CM | POA: Diagnosis not present

## 2021-12-16 DIAGNOSIS — J9601 Acute respiratory failure with hypoxia: Secondary | ICD-10-CM | POA: Diagnosis not present

## 2021-12-16 DIAGNOSIS — N390 Urinary tract infection, site not specified: Secondary | ICD-10-CM | POA: Diagnosis not present

## 2021-12-16 DIAGNOSIS — N289 Disorder of kidney and ureter, unspecified: Secondary | ICD-10-CM | POA: Diagnosis not present

## 2021-12-16 DIAGNOSIS — W19XXXA Unspecified fall, initial encounter: Secondary | ICD-10-CM | POA: Diagnosis not present

## 2021-12-16 DIAGNOSIS — G4733 Obstructive sleep apnea (adult) (pediatric): Secondary | ICD-10-CM | POA: Diagnosis not present

## 2021-12-16 DIAGNOSIS — I5033 Acute on chronic diastolic (congestive) heart failure: Secondary | ICD-10-CM | POA: Diagnosis not present

## 2021-12-16 DIAGNOSIS — M6281 Muscle weakness (generalized): Secondary | ICD-10-CM | POA: Diagnosis not present

## 2021-12-16 DIAGNOSIS — F339 Major depressive disorder, recurrent, unspecified: Secondary | ICD-10-CM | POA: Diagnosis not present

## 2021-12-16 DIAGNOSIS — I5032 Chronic diastolic (congestive) heart failure: Secondary | ICD-10-CM | POA: Diagnosis not present

## 2021-12-16 DIAGNOSIS — Z743 Need for continuous supervision: Secondary | ICD-10-CM | POA: Diagnosis not present

## 2021-12-16 DIAGNOSIS — R5381 Other malaise: Secondary | ICD-10-CM | POA: Diagnosis not present

## 2021-12-16 DIAGNOSIS — K5730 Diverticulosis of large intestine without perforation or abscess without bleeding: Secondary | ICD-10-CM | POA: Diagnosis not present

## 2021-12-16 DIAGNOSIS — I359 Nonrheumatic aortic valve disorder, unspecified: Secondary | ICD-10-CM | POA: Diagnosis not present

## 2021-12-16 DIAGNOSIS — I5030 Unspecified diastolic (congestive) heart failure: Secondary | ICD-10-CM | POA: Diagnosis not present

## 2021-12-16 LAB — RESP PANEL BY RT-PCR (FLU A&B, COVID) ARPGX2
Influenza A by PCR: NEGATIVE
Influenza B by PCR: NEGATIVE
SARS Coronavirus 2 by RT PCR: NEGATIVE

## 2021-12-16 LAB — CULTURE, BLOOD (ROUTINE X 2)
Culture: NO GROWTH
Culture: NO GROWTH

## 2021-12-16 LAB — BASIC METABOLIC PANEL
Anion gap: 8 (ref 5–15)
BUN: 24 mg/dL — ABNORMAL HIGH (ref 8–23)
CO2: 32 mmol/L (ref 22–32)
Calcium: 8.4 mg/dL — ABNORMAL LOW (ref 8.9–10.3)
Chloride: 95 mmol/L — ABNORMAL LOW (ref 98–111)
Creatinine, Ser: 1.33 mg/dL — ABNORMAL HIGH (ref 0.61–1.24)
GFR, Estimated: 52 mL/min — ABNORMAL LOW (ref >60)
Glucose, Bld: 109 mg/dL — ABNORMAL HIGH (ref 70–99)
Potassium: 4.1 mmol/L (ref 3.5–5.1)
Sodium: 135 mmol/L (ref 135–145)

## 2021-12-16 LAB — CBC
HCT: 27 % — ABNORMAL LOW (ref 39.0–52.0)
Hemoglobin: 8.8 g/dL — ABNORMAL LOW (ref 13.0–17.0)
MCH: 32.5 pg (ref 26.0–34.0)
MCHC: 32.6 g/dL (ref 30.0–36.0)
MCV: 99.6 fL (ref 80.0–100.0)
Platelets: 171 10*3/uL (ref 150–400)
RBC: 2.71 MIL/uL — ABNORMAL LOW (ref 4.22–5.81)
RDW: 16.2 % — ABNORMAL HIGH (ref 11.5–15.5)
WBC: 5 10*3/uL (ref 4.0–10.5)
nRBC: 0 % (ref 0.0–0.2)

## 2021-12-16 LAB — MAGNESIUM: Magnesium: 2.2 mg/dL (ref 1.7–2.4)

## 2021-12-16 LAB — PHOSPHORUS: Phosphorus: 4.3 mg/dL (ref 2.5–4.6)

## 2021-12-16 MED ORDER — METOPROLOL SUCCINATE ER 25 MG PO TB24: 12.5000 mg | ORAL_TABLET | Freq: Two times a day (BID) | ORAL | 0 refills | Status: DC

## 2021-12-16 MED ORDER — QUETIAPINE FUMARATE 25 MG PO TABS: 25.0000 mg | ORAL_TABLET | Freq: Every day | ORAL | Status: DC

## 2021-12-16 MED ORDER — METOPROLOL SUCCINATE ER 25 MG PO TB24: 12.5000 mg | ORAL_TABLET | Freq: Two times a day (BID) | ORAL | Status: DC

## 2021-12-16 MED ORDER — MIDODRINE HCL 5 MG PO TABS: 5.0000 mg | ORAL_TABLET | Freq: Three times a day (TID) | ORAL | 0 refills | Status: DC

## 2021-12-16 MED ORDER — FUROSEMIDE 40 MG PO TABS: 40.0000 mg | ORAL_TABLET | Freq: Every day | ORAL | Status: DC

## 2021-12-16 MED ORDER — MIDODRINE HCL 5 MG PO TABS
5.0000 mg | ORAL_TABLET | Freq: Three times a day (TID) | ORAL | Status: DC
  Administered 2021-12-16 (×2): 5 mg via ORAL
  Filled 2021-12-16 (×2): qty 1

## 2021-12-16 MED ORDER — ONE-DAILY MULTI VITAMINS PO TABS: 1.0000 | ORAL_TABLET | Freq: Every day | ORAL | Status: AC

## 2021-12-16 NOTE — Progress Notes (Signed)
Progress Note  Patient Name: [name] Date of Encounter: 12/16/2021  Primary Cardiologist: Fletcher Anon  Subjective   No chest pain or palpitations.  Improved dyspnea.  BP remains soft, though stable.  He reports this is a longstanding issue.  Inpatient Medications    Scheduled Meds:  acidophilus  1 capsule Oral Daily   ALPRAZolam  0.5 mg Oral QHS   vitamin C  1,000 mg Oral Daily   enoxaparin (LOVENOX) injection  40 mg Subcutaneous Q24H   fluconazole  100 mg Oral Daily   fluticasone  2 spray Each Nare BID   folic acid  3,845 mcg Oral Daily   [START ON 12/17/2021] furosemide  40 mg Oral Daily   gabapentin  600 mg Oral BID AC & HS   guaiFENesin  600 mg Oral BID   hydroxychloroquine  200 mg Oral BID   iron polysaccharides  150 mg Oral Daily   loratadine  10 mg Oral Daily   methotrexate  25 mg Oral Weekly   [START ON 12/17/2021] metoprolol succinate  12.5 mg Oral BID   mirabegron ER  50 mg Oral Daily   multivitamin with minerals  1 tablet Oral Daily   pantoprazole  40 mg Oral Daily   polyethylene glycol  17 g Oral QHS   pravastatin  20 mg Oral q1800   QUEtiapine  25 mg Oral QHS   Continuous Infusions:  PRN Meds: acetaminophen **OR** acetaminophen, albuterol, diphenhydrAMINE, EPINEPHrine, magnesium hydroxide, ondansetron **OR** ondansetron (ZOFRAN) IV, oxyCODONE-acetaminophen   Vital Signs    Vitals:   12/15/21 2006 12/16/21 0618 12/16/21 0624 12/16/21 0847  BP: (!) 104/52  (!) 103/53 (!) 99/50  Pulse: 73  85 74  Resp: 18  (!) 22 18  Temp: 97.9 F (36.6 C)  97.9 F (36.6 C) 97.6 F (36.4 C)  TempSrc:      SpO2: 94% (!) 66% 94% 96%  Weight:      Height:        Intake/Output Summary (Last 24 hours) at 12/16/2021 1030 Last data filed at 12/16/2021 3646 Gross per 24 hour  Intake --  Output 1350 ml  Net -1350 ml   Filed Weights   12/12/21 0500 12/13/21 0638 12/14/21 0500  Weight: 93.6 kg 91.2 kg 92.6 kg    Telemetry    Not on telemetry - Personally  Reviewed  ECG    No new tracing - Personally Reviewed  Physical Exam   GEN: No acute distress.   Neck: No JVD. Cardiac: RRR, II/VI systolic murmur RUSB, no rubs, or gallops.  Respiratory: Clear to auscultation bilaterally.  GI: Soft, nontender, non-distended.   MS: No edema; No deformity. Neuro:  Alert and oriented x 3; Nonfocal.  Psych: Normal affect.  Labs    Chemistry Recent Labs  Lab 12/10/21 1639 12/11/21 0547 12/13/21 0545 12/14/21 0506 12/15/21 0625  NA 141   < > 138 136 134*  K 4.0   < > 3.6 3.7 3.8  CL 99   < > 97* 97* 95*  CO2 33*   < > 33* 33* 32  GLUCOSE 95   < > 102* 98 93  BUN 28*   < > 19 20 19   CREATININE 1.41*   < > 1.20 1.23 1.31*  CALCIUM 8.4*   < > 8.0* 7.8* 8.0*  PROT 6.2*  --   --   --   --   ALBUMIN 3.2*  --   --   --   --  AST 19  --   --   --   --   ALT 11  --   --   --   --   ALKPHOS 56  --   --   --   --   BILITOT 0.6  --   --   --   --   GFRNONAA 49*   < > 59* 58* 53*  ANIONGAP 9   < > 8 6 7    < > = values in this interval not displayed.     Hematology Recent Labs  Lab 12/13/21 0545 12/14/21 0506 12/16/21 1000  WBC 4.1 3.7* 5.0  RBC 2.32* 2.36* 2.71*  HGB 7.4* 7.7* 8.8*  HCT 22.7* 23.4* 27.0*  MCV 97.8 99.2 99.6  MCH 31.9 32.6 32.5  MCHC 32.6 32.9 32.6  RDW 15.5 15.3 16.2*  PLT 229 234 171    Cardiac EnzymesNo results for input(s): TROPONINI in the last 168 hours. No results for input(s): TROPIPOC in the last 168 hours.   BNP Recent Labs  Lab 12/10/21 1647  BNP 585.0*     DDimer No results for input(s): DDIMER in the last 168 hours.   Radiology    US RENAL  Result Date: 12/15/2021 IMPRESSION: Status post right nephrectomy. Left kidney shows no hydronephrosis. Small left renal cyst. Electronically Signed   By: Elmer Picker M.D.   On: 12/15/2021 13:08    Cardiac Studies   2D echo 12/12/2021: 1. Left ventricular ejection fraction, by estimation, is 60 to 65%. The  left ventricle has normal function.  The left ventricle has no regional  wall motion abnormalities. There is mild left ventricular hypertrophy.  Left ventricular diastolic parameters  are consistent with Grade I diastolic dysfunction (impaired relaxation).   2. Right ventricular systolic function is low normal. The right  ventricular size is normal. There is normal pulmonary artery systolic  pressure.   3. The mitral valve is grossly normal. Mild mitral valve regurgitation.   4. The aortic valve is calcified. Aortic valve regurgitation is trivial.  Moderate aortic valve stenosis. Aortic valve area, by VTI measures 1.32  cm. Aortic valve mean gradient measures 23.0 mmHg. Aortic valve Vmax  measures 3.12 m/s.   5. Aortic dilatation noted. There is borderline dilatation of the aortic  root, measuring 38 mm.  Patient Profile     88 y.o. male with history of persistent A. fib, HFpEF, aortic stenosis, TIA, rheumatoid arthritis, chronic hyponatremia due to suspected SIADH, HLD, and osteoarthritis who we are seeing for HFpEF.  Assessment & Plan    1.  HFpEF: -Symptoms much improved following IV diuresis -Appears euvolemic -Lasix 40 mg daily ordered to be started on 1/20 -CHF education  2.  Persistent A. fib: -Maintaining sinus rhythm -Remains on Toprol-XL 25 mg twice daily -Eliquis has been held secondary to underlying anemia and fall risk with significant physical deconditioning noted -Resumption of [place] will need to be reevaluated moving forward pending fall risk and underlying anemia  3.  Hypotension: -Stable and longstanding issue -Start midodrine 5 mg 3 times daily  4.  Weakness/physical deconditioning: -Likely multifactorial including recent hospital admissions, anemia, and relative hypotension -PT/OT -Planning to be discharged to rehab facility once bed is available  5.  AKI in the context of solitary kidney: -BUN/serum creatinine trending up -Lasix holiday      For questions or updates, please contact  [place] Please consult [email or web address] for contact info under Cardiology/STEMI.    Signed, Christell Faith, PA-C  [place] Pager: [phone] 12/16/2021, 10:30 AM

## 2021-12-16 NOTE — Plan of Care (Signed)
°  Problem: Education: °Goal: Ability to demonstrate management of disease process will improve °Outcome: Progressing °Goal: Ability to verbalize understanding of medication therapies will improve °Outcome: Progressing °Goal: Individualized Educational Video(s) °Outcome: Progressing °  °Problem: Activity: °Goal: Capacity to carry out activities will improve °Outcome: Progressing °  °Problem: Cardiac: °Goal: Ability to achieve and maintain adequate cardiopulmonary perfusion will improve °Outcome: Progressing °  °Problem: Activity: °Goal: Ability to tolerate increased activity will improve °Outcome: Progressing °  °Problem: Clinical Measurements: °Goal: Ability to maintain a body temperature in the normal range will improve °Outcome: Progressing °  °Problem: Respiratory: °Goal: Ability to maintain adequate ventilation will improve °Outcome: Progressing °Goal: Ability to maintain a clear airway will improve °Outcome: Progressing °  °Problem: Education: °Goal: Knowledge of General Education information will improve °Description: Including pain rating scale, medication(s)/side effects and non-pharmacologic comfort measures °Outcome: Progressing °  °Problem: Health Behavior/Discharge Planning: °Goal: Ability to manage health-related needs will improve °Outcome: Progressing °  °Problem: Clinical Measurements: °Goal: Ability to maintain clinical measurements within normal limits will improve °Outcome: Progressing °Goal: Will remain free from infection °Outcome: Progressing °Goal: Diagnostic test results will improve °Outcome: Progressing °Goal: Respiratory complications will improve °Outcome: Progressing °Goal: Cardiovascular complication will be avoided °Outcome: Progressing °  °Problem: Activity: °Goal: Risk for activity intolerance will decrease °Outcome: Progressing °  °Problem: Nutrition: °Goal: Adequate nutrition will be maintained °Outcome: Progressing °  °Problem: Coping: °Goal: Level of anxiety will  decrease °Outcome: Progressing °  °Problem: Elimination: °Goal: Will not experience complications related to bowel motility °Outcome: Progressing °Goal: Will not experience complications related to urinary retention °Outcome: Progressing °  °Problem: Pain Managment: °Goal: General experience of comfort will improve °Outcome: Progressing °  °Problem: Safety: °Goal: Ability to remain free from injury will improve °Outcome: Progressing °  °Problem: Skin Integrity: °Goal: Risk for impaired skin integrity will decrease °Outcome: Progressing °  °

## 2021-12-16 NOTE — TOC Progression Note (Signed)
Transition of Care [facility]) - Progression Note    Patient Details  Name: TYRAN HUSER MRN: [id number] Date of Birth: [birth year]  [facility]) CM/SW [place], RN Phone Number: 12/16/2021, 2:18 PM  Clinical Narrative:   Spoke with the patient's daughter [name] notified her that the patient will go to room 607A at [facility] today transported by EMS, EMS called, he is 2nd on list         Expected Discharge Plan and Services           Expected Discharge Date: 12/16/21                                     Social Determinants of Health (SDOH) Interventions    Readmission Risk Interventions Readmission Risk Prevention Plan 09/12/2021 06/23/2019  Transportation Screening Complete Complete  PCP or Specialist Appt within 3-5 Days - Complete  HRI or [place] - Complete  Social Work Consult for [place] Planning/Counseling - Complete  Palliative Care Screening - Not Applicable  Medication Review [employer] photographer) Complete Complete  PCP or Specialist appointment within 3-5 days of discharge Complete -  [place] or Home Care Consult Complete -  SW Recovery Care/Counseling Consult Complete -  Palliative Care Screening Not Applicable -  Skilled Nursing Facility Complete -  Some recent data might be hidden

## 2021-12-16 NOTE — TOC Progression Note (Addendum)
Transition of Care [facility]) - Progression Note    Patient Details  Name: [name] MRN: [id number] Date of Birth: 1934/05/12  Transition of Care [facility]) CM/SW [place], RN Phone Number: 12/16/2021, 9:50 AM  Clinical Narrative:   Received notice that Ins was approved, 1/19-1/23, Plans to DC to [facility], Auth [id number]  Requested Physician to order the Covid test to DC       Expected Discharge Plan and Services                                                 Social Determinants of Health (SDOH) Interventions    Readmission Risk Interventions Readmission Risk Prevention Plan 09/12/2021 06/23/2019  Transportation Screening Complete Complete  PCP or Specialist Appt within 3-5 Days - Complete  HRI or [place] - Complete  Social Work Consult for [place] Planning/Counseling - Complete  Palliative Care Screening - Not Applicable  Medication Review [employer] photographer) Complete Complete  PCP or Specialist appointment within 3-5 days of discharge Complete -  [place] or Home Care Consult Complete -  SW Recovery Care/Counseling Consult Complete -  Palliative Care Screening Not Applicable -  [place] Complete -  Some recent data might be hidden

## 2021-12-16 NOTE — Progress Notes (Signed)
Occupational Therapy Treatment Patient Details Name: [name] MRN: [id number] DOB: 10/29/1934 Today's Date: 12/16/2021   History of present illness 88 y.o. male with medical history significant for anxiety, asthma, CAD, CHF, CKD, who presented to the ER with acute onset of generalized weakness and fatigue.  He admitted to recent dry cough and stated that he has been recently treated for pneumonia few weeks ago. Pt admitted for acute on chronic diastolic congestive heart failure.   OT comments  Pt seen for OT and co-tx with PT. Pt agreeable, endorses some L heel discomfort. Pt instructed in bed mobility, posture, and ADL transfers requiring mod-max VC for sequencing and positioning to improve balance in standing, and heavy MOD A x2 to complete and maintain standing, x3 trials. Pt performed grooming tasks seated EOB with set up. MAX A to wash back in sitting. Initial sitting balance fair-, improving with time and VC for anterior weight shift to fair+ and supv. Pt progressing towards goals, continues to benefit from skilled OT services. SNF remains appropriate.    Recommendations for follow up therapy are one component of a multi-disciplinary discharge planning process, led by the attending physician.  Recommendations may be updated based on patient status, additional functional criteria and insurance authorization.    Follow Up Recommendations  Skilled nursing-short term rehab (<3 hours/day)    Assistance Recommended at Discharge Intermittent Supervision/Assistance  Patient can return home with the following  Two people to help with walking and/or transfers;Two people to help with bathing/dressing/bathroom;Assist for transportation;Help with stairs or ramp for entrance;Assistance with cooking/housework   Equipment Recommendations  None recommended by OT    Recommendations for Other Services      Precautions / Restrictions Precautions Precautions: Fall Restrictions Weight Bearing  Restrictions: No       Mobility Bed Mobility Overal bed mobility: Needs Assistance Bed Mobility: Supine to Sit, Sit to Supine     Supine to sit: Mod assist, +2 for physical assistance Sit to supine: Mod assist, +2 for physical assistance   General bed mobility comments: VC for hand placement, bed rail use, anterior weight shift    Transfers Overall transfer level: Needs assistance Equipment used: Rolling walker (2 wheels) Transfers: Sit to/from Stand Sit to Stand: Mod assist, +2 physical assistance, From elevated surface           General transfer comment: Heavy MOD A x2 + VC for sequencing, body mechanics, anterior weight shift, knees/hips unable to fully extend     Balance Overall balance assessment: Needs assistance Sitting-balance support: No upper extremity supported, Feet supported Sitting balance-[name]: Fair Sitting balance - Comments: initially poor with posterior lean, improving with VC and time   Standing balance support: Bilateral upper extremity supported, During functional activity Standing balance-[name]: Poor Standing balance comment: poor-, heavy assist x2 to maintain                           ADL either performed or assessed with clinical judgement   ADL Overall ADL's : Needs assistance/impaired     Grooming: Sitting;Wash/dry face;Set up;Supervision/safety                                      Extremity/Trunk Assessment              Vision       Perception     Praxis  Cognition Arousal/Alertness: Awake/alert Behavior During Therapy: WFL for tasks assessed/performed Overall Cognitive Status: Within Functional Limits for tasks assessed                                 General Comments: initially sleepy, endorses having recently had pain meds        Exercises Other Exercises Other Exercises: Verbal instruction, visual demo, and tactile cues for ADL standing trials x3     Shoulder Instructions       General Comments      Pertinent Vitals/ Pain          Home Living                                          Prior Functioning/Environment              Frequency  Min 2X/week        Progress Toward Goals  OT Goals(current goals can now be found in the care plan section)  Progress towards OT goals: Progressing toward goals  Acute Rehab OT Goals Patient Stated Goal: to regain independence OT Goal Formulation: With patient Time For Goal Achievement: 12/26/21 Potential to Achieve Goals: Good  Plan Discharge plan remains appropriate;Frequency remains appropriate    Co-evaluation    PT/OT/SLP Co-Evaluation/Treatment: Yes Reason for Co-Treatment: Necessary to address cognition/behavior during functional activity;For patient/therapist safety;To address functional/ADL transfers PT goals addressed during session: Mobility/safety with mobility;Balance;Proper use of DME OT goals addressed during session: ADL's and self-care;Proper use of Adaptive equipment and DME      AM-PAC OT "6 Clicks" Daily Activity     Outcome Measure   Help from another person eating meals?: None Help from another person taking care of personal grooming?: A Little Help from another person toileting, which includes using toliet, bedpan, or urinal?: A Lot Help from another person bathing (including washing, rinsing, drying)?: A Lot Help from another person to put on and taking off regular upper body clothing?: A Little Help from another person to put on and taking off regular lower body clothing?: A Lot 6 Click Score: 16    End of Session Equipment Utilized During Treatment: Gait belt;Rolling walker (2 wheels)  OT Visit Diagnosis: Muscle weakness (generalized) (M62.81);Unsteadiness on feet (R26.81)   Activity Tolerance Patient tolerated treatment well   Patient Left in bed;with call bell/phone within reach;with bed alarm set   Nurse  Communication          Time: 1497-0263 OT Time Calculation (min): 35 min  Charges: OT General Charges $OT Visit: 1 Visit OT Treatments $Therapeutic Activity: 8-22 mins  Ardeth Perfect., MPH, MS, OTR/L ascom [phone] 12/16/21, 1:07 PM

## 2021-12-16 NOTE — Progress Notes (Signed)
Physical Therapy Treatment Patient Details Name: [name] MRN: [id number] DOB: 10-08-1934 Today's Date: 12/16/2021   History of Present Illness 88 y.o. male with medical history significant for anxiety, asthma, CAD, CHF, CKD, who presented to the ER with acute onset of generalized weakness and fatigue.  He admitted to recent dry cough and stated that he has been recently treated for pneumonia few weeks ago. Pt admitted for acute on chronic diastolic congestive heart failure.    PT Comments    Pt received with OT for Co-treat. Pt agreeable to session. Pt still requiring modA+2 to transfer to Eob due to limited shoulder mobility and LE weakness. Pt able to initiate LE's towards EoB. Initial post lean noted requiring PT assist. After cuing pt able to maintain static sitting with supervision. Pt Re-educated on standing biomechanics with anterior trunk lean, safe hand placement, and LE's under BOS. X3 trials performed with bed elevated ultimately relying on modA+2 to maintain standing in crouched posture with very limited ability to extend at hips and knees. Pt tolerating ~20-30 sec of standing time on bouts which is an improvement from previous sessions. Pt returned to supine with modA at trunk and torso. LE's off loaded. D/c recs remain appropriate due to deficits in strength/mobility leading to heavy +2 physical assist for all safe mobility at this time.     Recommendations for follow up therapy are one component of a multi-disciplinary discharge planning process, led by the attending physician.  Recommendations may be updated based on patient status, additional functional criteria and insurance authorization.  Follow Up Recommendations  Skilled nursing-short term rehab (<3 hours/day)     Assistance Recommended at Discharge Frequent or constant Supervision/Assistance  Patient can return home with the following Two people to help with walking and/or transfers;Two people to help with  bathing/dressing/bathroom;Assistance with cooking/housework;Assist for transportation;Help with stairs or ramp for entrance   Equipment Recommendations  None recommended by PT    Recommendations for Other Services       Precautions / Restrictions Precautions Precautions: Fall Restrictions Weight Bearing Restrictions: No     Mobility  Bed Mobility Overal bed mobility: Needs Assistance Bed Mobility: Supine to Sit, Sit to Supine     Supine to sit: Mod assist, +2 for physical assistance Sit to supine: Mod assist, +2 for physical assistance   General bed mobility comments: VC for hand placement, bed rail use, anterior weight shift Patient Response: Cooperative  Transfers Overall transfer level: Needs assistance Equipment used: Rolling walker (2 wheels) Transfers: Sit to/from Stand Sit to Stand: Mod assist, +2 physical assistance, From elevated surface           General transfer comment: Heavy MOD A x2 + VC for sequencing, body mechanics, anterior weight shift, knees/hips unable to fully extend    Ambulation/Gait     Assistive device: Rolling walker (2 wheels)         General Gait Details: unable to perform due to unsafe mobility   Stairs             Wheelchair Mobility    Modified Rankin (Stroke Patients Only)       Balance Overall balance assessment: Needs assistance Sitting-balance support: No upper extremity supported, Feet supported Sitting balance-[name]: Fair Sitting balance - Comments: initially poor with posterior lean, improving with VC and time   Standing balance support: Bilateral upper extremity supported, During functional activity Standing balance-[name]: Poor Standing balance comment: poor-, heavy assist x2 to maintain. Crouched posture  Cognition Arousal/Alertness: Awake/alert Behavior During Therapy: WFL for tasks assessed/performed Overall Cognitive Status: Within Functional  Limits for tasks assessed                                 General Comments: initially sleepy, endorses having recently had pain meds        Exercises Other Exercises Other Exercises: Verbal instruction, visual demo, and tactile cues for ADL standing trials x3    General Comments        Pertinent Vitals/Pain Pain Assessment Pain Assessment: Faces Faces Pain Scale: Hurts a little bit Pain Location: L heel Pain Descriptors / Indicators: Discomfort, Sharp Pain Intervention(s): Limited activity within patient's tolerance, Monitored during session, Repositioned    Home Living                          Prior Function            PT Goals (current goals can now be found in the care plan section) Acute Rehab PT Goals Patient Stated Goal: to get stronger and be able to move again PT Goal Formulation: With patient Time For Goal Achievement: 12/26/21 Potential to Achieve Goals: Fair Progress towards PT goals: Not progressing toward goals - comment    Frequency    Min 2X/week      PT Plan Current plan remains appropriate    Co-evaluation   Reason for Co-Treatment: Necessary to address cognition/behavior during functional activity;For patient/therapist safety;To address functional/ADL transfers PT goals addressed during session: Mobility/safety with mobility;Balance;Proper use of DME OT goals addressed during session: ADL's and self-care;Proper use of Adaptive equipment and DME      AM-PAC PT "6 Clicks" Mobility   Outcome Measure  Help needed turning from your back to your side while in a flat bed without using bedrails?: A Little Help needed moving from lying on your back to sitting on the side of a flat bed without using bedrails?: A Lot Help needed moving to and from a bed to a chair (including a wheelchair)?: A Lot Help needed standing up from a chair using your arms (e.g., wheelchair or bedside chair)?: A Lot Help needed to walk in hospital  room?: Total Help needed climbing 3-5 steps with a railing? : Total 6 Click Score: 11    End of Session Equipment Utilized During Treatment: Gait belt Activity Tolerance: Patient tolerated treatment well Patient left: in bed;with call bell/phone within reach;with bed alarm set Nurse Communication: Mobility status PT Visit Diagnosis: Unsteadiness on feet (R26.81);Other abnormalities of gait and mobility (R26.89);Muscle weakness (generalized) (M62.81);History of falling (Z91.81);Difficulty in walking, not elsewhere classified (R26.2)     Time: 0623-7628 PT Time Calculation (min) (ACUTE ONLY): 24 min  Charges:  $Therapeutic Activity: 8-22 mins                    Salem Caster. Fairly IV, PT, DPT Physical Therapist- [facility]  12/16/2021, 1:25 PM

## 2021-12-16 NOTE — Care Management Important Message (Signed)
Important Message  Patient Details  Name: [name] MRN: [id number] Date of Birth: 1934/05/28   Medicare Important Message Given:  Yes     Juliann Pulse A Fani Rotondo 12/16/2021, 10:55 AM

## 2021-12-16 NOTE — Discharge Summary (Signed)
[facility] Discharge Summary   Patient: [name] OIZ:[id number]  PCP: [name], MD  Date of admission: 12/11/2021   Date of discharge:  12/16/2021     Discharge Diagnoses:  Principal diagnosis Volume overload Active Problems:   Acute kidney injury superimposed on CKD ([place])   Acute on chronic diastolic heart failure (HCC)   Generalized weakness   Acute heart failure with preserved ejection fraction (HCC)   Atrial fibrillation (HCC)   Iron deficiency anemia   Admitted From: Home Disposition:  SNF   Recommendations for Outpatient Follow-up:  PCP: in 1wk and cardiologist in 1 wk Follow up LABS/TEST: CBC and BMP in 1 wk\ Follow with PCP, patient should be seen by an MD in 1 to 2 days, monitor BP and titrate medications accordingly, continue metoprolol, Lasix and midodrine with holding parameters.  Repeat BMP after 1 week to check renal functions. Follow with cardiology in 1 to 2 weeks   Contact information for after-discharge care     Destination     [facility] Preferred SNF .   Service: Skilled Nursing Contact information: [place] [phone]                    Diet recommendation: Cardiac diet  Activity: The patient is advised to gradually reintroduce usual activities, as tolerated  Discharge Condition: stable  Code Status: Full code   History of present illness: As per the H and P dictated on admission Hospital Course:  [name] is a 88 y.o. male with medical history significant for anxiety, asthma, CAD, CHF, CKD, who presented to the ER with acute onset of generalized weakness and fatigue.  He admitted to recent dry cough and stated that he has been recently treated for pneumonia few weeks ago. Chest x-ray showed evidence of pulmonary edema, questionable pneumonia. Patient was giving IV Lasix, antibiotics. 1/15.  Pneumonia ruled out, s/p IV Lasix.  Cardiology was consulted and  patient was admitted for further management as below. Assessment and Plan: Acute on chronic diastolic congestive heart failure. Moderate aortic stenosis. Reviewed echocardiogram, ejection fraction 60 to 65% with moderate aortic stenosis. Patient was followed by cardiology, s/p IV Lasix.  Volume status much better, cardiology has transition to oral Lasix.  Continue Lasix 40 mg p.o. daily, fluid striction 1.4 L/day.  Repeat BMP after 1 week to check renal functions and adjust the dose of Lasix.  Follow with cardiology in 1 to 2 weeks. Generalized weakness, Iron deficient anemia, continue iron supplement orally. Acute kidney injury, Creatinine 1.33, slightly elevated today.  US renal: Status post right nephrectomy. Left kidney shows no hydronephrosis. Small left renal cyst. Monitor renal functions daily, avoid nephrotoxic medications Paroxysmal atrial fibrillation, Cardiology has discontinued Eliquis due to concern of fall risk and a drop in hemoglobin.  Continue Lopressor with holding parameters, avoid hypotension. Chronic indwelling Foley catheter.  Change every 28 days, follow with urology as an outpatient. Insomnia, DC trazodone, started lower dose Seroquel, patient slept much better.  Please carryover Seroquel to nursing home Body mass index is 30.15 kg/m.  Nutrition Interventions:   Pressure Injury 10/05/21 Buttocks Mid Stage 1 -  Intact skin with non-blanchable redness of a localized area usually over a bony prominence. (Active)  10/05/21 1730  Location: Buttocks  Location Orientation: Mid  Staging: Stage 1 -  Intact skin with non-blanchable redness of a localized area usually over a bony prominence.  Wound Description (Comments):  Present on Admission: Yes     Pressure Injury 12/12/21 Buttocks Left (Active)  12/12/21 2012  Location: Buttocks  Location Orientation: Left  Staging:   Wound Description (Comments):   Present on Admission:      - Patient was instructed, not to drive,  operate heavy machinery, perform activities at heights, swimming or participation in water activities or provide baby sitting services while on Pain, Sleep and Anxiety Medications; until his outpatient Physician has advised to do so again.  - Also recommended to not to take more than prescribed Pain, Sleep and Anxiety Medications.  Patient was seen by physical therapy, who recommended Therapy, which was arranged. On the day of the discharge the patient's vitals were stable, and no other acute medical condition were reported by patient. the patient was felt safe to be discharge at SNF with Therapy.  Consultants: Cardiology Procedures: None  Discharge Exam: General: Appear in no distress, no Rash; Oral Mucosa Clear, moist. Cardiovascular: S1 and S2 Present, no Murmur, Respiratory: normal respiratory effort, Bilateral Air entry present and no Crackles, no wheezes Abdomen: Bowel Sound present, Soft and no tenderness, no hernia Extremities: no Pedal edema, no calf tenderness Neurology: alert and oriented to time, place, and person affect appropriate. Frequent turning every 2 hourly  Filed Weights   12/12/21 0500 12/13/21 0638 12/14/21 0500  Weight: 93.6 kg 91.2 kg 92.6 kg   Vitals:   12/16/21 0847 12/16/21 1157  BP: (!) 99/50 (!) 108/41  Pulse: 74 70  Resp: 18 17  Temp: 97.6 F (36.4 C) 97.6 F (36.4 C)  SpO2: 96% 93%    DISCHARGE MEDICATION: Allergies as of 12/16/2021       Reactions   Chlorhexidine    Ciprofloxacin Nausea And Vomiting   Headache   Citalopram Hydrobromide Other (See Comments)   "unknown"   Clindamycin Nausea And Vomiting   Lorazepam Other (See Comments)   Adverse reaction   Paroxetine Nausea Only   Ramipril Other (See Comments)   "unknown"   Simvastatin    Sulfa Antibiotics Other (See Comments)        Medication List     STOP taking these medications    apixaban 5 MG Tabs tablet Commonly known as: Eliquis   traZODone 50 MG tablet Commonly  known as: DESYREL       TAKE these medications    acetaminophen 500 MG tablet Commonly known as: TYLENOL Take 500 mg by mouth 3 (three) times daily as needed for mild pain or moderate pain.   albuterol 108 (90 Base) MCG/ACT inhaler Commonly known as: VENTOLIN HFA Inhale 2 puffs into the lungs every 6 (six) hours as needed for wheezing or shortness of breath.   Align 4 MG Caps Take 1 capsule (4 mg total) by mouth daily.   ALOE PO Take 400 mg by mouth daily.   ALPRAZolam 0.5 MG tablet Commonly known as: XANAX Take 1 tablet (0.5 mg total) by mouth at bedtime. What changed:  when to take this reasons to take this   cetirizine 10 MG tablet Commonly known as: ZYRTEC Take 10 mg by mouth at bedtime.   chlorhexidine 0.12 % solution Commonly known as: PERIDEX Use as directed 15 mLs in the mouth or throat daily as needed.   Co Q 10 100 MG Caps Take 100 mg by mouth daily.   Cranberry 250 MG Chew Chew 250 mg by mouth daily.   EPINEPHrine 0.3 mg/0.3 mL Soaj injection Commonly known as: EPI-PEN Inject 0.3 mg into  the muscle as needed for anaphylaxis.   fluconazole 100 MG tablet Commonly known as: DIFLUCAN Take 1 tablet (100 mg total) by mouth daily.   fluticasone 50 MCG/ACT nasal spray Commonly known as: FLONASE Place 2 sprays into both nostrils 2 (two) times daily.   folic acid 856 MCG tablet Commonly known as: FOLVITE Take 1,200 mcg by mouth daily.   furosemide 40 MG tablet Commonly known as: LASIX Take 1 tablet (40 mg total) by mouth daily. Skip dose if SBP less than 120 mmHg   gabapentin 600 MG tablet Commonly known as: NEURONTIN Take 600 mg by mouth 2 (two) times daily at 8 am and 10 pm.   hydroxychloroquine 200 MG tablet Commonly known as: PLAQUENIL Take 200 mg by mouth 2 (two) times daily.   iron polysaccharides 150 MG capsule Commonly known as: NIFEREX Take 150 mg by mouth daily.   ketoconazole 2 % shampoo Commonly known as: NIZORAL Apply 1  application topically 2 (two) times a week.   ketoconazole 2 % cream Commonly known as: NIZORAL Apply 1 application topically 2 (two) times daily.   methotrexate 2.5 MG tablet Commonly known as: RHEUMATREX Take 25 mg by mouth every Wednesday.   metoprolol succinate 25 MG 24 hr tablet Commonly known as: TOPROL-XL Take 0.5 tablets (12.5 mg total) by mouth 2 (two) times daily. Skip dose if systolic BP less than 314 mmHg and/or heart rate less than 65 What changed: additional instructions   midodrine 5 MG tablet Commonly known as: PROAMATINE Take 1 tablet (5 mg total) by mouth 3 (three) times daily with meals. Skip dose if SBP greater than 110 mmHg and/or heart rate greater than 100   milk thistle 175 MG tablet Take 175 mg by mouth daily.   multivitamin tablet Take 1 tablet by mouth daily.   Myrbetriq 50 MG Tb24 tablet Generic drug: mirabegron ER TAKE 1 TABLET(50 MG) BY MOUTH DAILY   nystatin cream Commonly known as: MYCOSTATIN Apply 1 application topically 2 (two) times daily.   Omega 3 1200 MG Caps Take 1 capsule by mouth daily.   omeprazole 20 MG capsule Commonly known as: PRILOSEC Take 1 capsule (20 mg total) by mouth daily as needed.   ondansetron 4 MG tablet Commonly known as: ZOFRAN Take 4 mg by mouth every 6 (six) hours as needed for nausea or vomiting.   polyethylene glycol powder 17 GM/SCOOP powder Commonly known as: GLYCOLAX/MIRALAX Take 17 g by mouth at bedtime.   pravastatin 20 MG tablet Commonly known as: PRAVACHOL TAKE 1 TABLET(20 MG) BY MOUTH AT BEDTIME   QUEtiapine 25 MG tablet Commonly known as: SEROQUEL Take 1 tablet (25 mg total) by mouth at bedtime.   Selenium 200 MCG Caps Take 200 mcg by mouth daily.   triamcinolone ointment 0.1 % Commonly known as: KENALOG Apply 1 application topically as needed (skin irritation).   Turmeric 500 MG Caps Take 500 mg by mouth daily.   vitamin B-12 1000 MCG tablet Commonly known as:  CYANOCOBALAMIN Take 1,000 mcg by mouth daily.   vitamin C 1000 MG tablet Take 1,000 mg by mouth daily.   Vitamin D3 50 MCG (2000 UT) Tabs Take 2,000 Units by mouth daily.   Zinc 50 MG Tabs Take 1 tablet by mouth daily.               Discharge Care Instructions  (From admission, onward)           Start     Ordered  12/16/21 0000  Discharge wound care:       Comments: Continue wound care   12/16/21 1257           Allergies  Allergen Reactions   Chlorhexidine    Ciprofloxacin Nausea And Vomiting    Headache   Citalopram Hydrobromide Other (See Comments)    "unknown"   Clindamycin Nausea And Vomiting   Lorazepam Other (See Comments)    Adverse reaction   Paroxetine Nausea Only   Ramipril Other (See Comments)    "unknown"   Simvastatin    Sulfa Antibiotics Other (See Comments)   Discharge Instructions     Call MD for:  difficulty breathing, headache or visual disturbances   Complete by: As directed    Call MD for:  extreme fatigue   Complete by: As directed    Call MD for:  persistant dizziness or light-headedness   Complete by: As directed    Call MD for:  persistant nausea and vomiting   Complete by: As directed    Call MD for:  severe uncontrolled pain   Complete by: As directed    Call MD for:  temperature >100.4   Complete by: As directed    Diet - low sodium heart healthy   Complete by: As directed    Fluid restriction 1.5 L/day   Discharge instructions   Complete by: As directed    Follow with PCP, patient should be seen by an MD in 1 to 2 days, monitor BP and titrate medications accordingly, continue metoprolol, Lasix and midodrine with holding parameters.  Repeat BMP after 1 week to check renal functions. Follow with cardiology in 1 to 2 weeks   Discharge wound care:   Complete by: As directed    Continue wound care   Increase activity slowly   Complete by: As directed        The results of significant diagnostics from this  hospitalization (including imaging, microbiology, ancillary and laboratory) are listed below for reference.    Significant Diagnostic Studies: DG Chest 1 View  Result Date: 12/10/2021 CLINICAL DATA:  weakness EXAM: CHEST  1 VIEW COMPARISON:  Chest x-ray 11/16/2021, CT chest 05/30/2018 FINDINGS: The heart and mediastinal contours are unchanged. Aortic calcification. Prominent hilar vasculature. Azygous fissure. Left basilar streaky airspace opacities. Question developing airspace opacity within the lingula. No focal consolidation. Increased interstitial markings. No pleural effusion. No pneumothorax. No acute osseous abnormality. IMPRESSION: 1. Pulmonary edema. 2. Question developing airspace opacity within the lingula may represent infection/inflammation. 3. Left basilar streaky airspace opacity may represent atelectasis versus infection/inflammation. 4.  Aortic Atherosclerosis (ICD10-I70.0). Electronically Signed   By: Iven Finn M.D.   On: 12/10/2021 17:21   DG Chest 2 View  Result Date: 11/16/2021 CLINICAL DATA:  Pneumonia. EXAM: CHEST - 2 VIEW COMPARISON:  Chest radiograph dated 10/05/2021. FINDINGS: Shallow inspiration with bibasilar atelectasis. No focal consolidation, pleural effusion, or pneumothorax. The cardiac silhouette is within limits. Atherosclerotic calcification of the aorta. Degenerative changes of the spine and shoulders. No acute osseous pathology. IMPRESSION: No active cardiopulmonary disease. Electronically Signed   By: Anner Crete M.D.   On: 11/16/2021 18:01   CT Head Wo Contrast  Result Date: 12/10/2021 CLINICAL DATA:  Mental status change, unknown cause, generalized weakness EXAM: CT HEAD WITHOUT CONTRAST TECHNIQUE: Contiguous axial images were obtained from the base of the skull through the vertex without intravenous contrast. RADIATION DOSE REDUCTION: This exam was performed according to the departmental dose-optimization program which includes automated exposure  control, adjustment of the mA and/or kV according to patient size and/or use of iterative reconstruction technique. COMPARISON:  None. BRAIN: BRAIN Cerebral ventricle sizes are concordant with the degree of cerebral volume loss. Patchy and confluent areas of decreased attenuation are noted throughout the deep and periventricular white matter of the cerebral hemispheres bilaterally, compatible with chronic microvascular ischemic disease. No evidence of large-territorial acute infarction. No parenchymal hemorrhage. No mass lesion. No extra-axial collection. No mass effect or midline shift. No hydrocephalus. Basilar cisterns are patent. Vascular: No hyperdense vessel. Atherosclerotic calcifications are present within the cavernous internal carotid arteries. Skull: No acute fracture or focal lesion. Sinuses/Orbits: Bilateral maxillary and sphenoid mucosal thickening. Mild mucosal thickening of the ethmoid sinuses. Remaining visualized paranasal sinuses and mastoid air cells are clear. The orbits are unremarkable. Other: None. IMPRESSION: No acute intracranial abnormality. Electronically Signed   By: Iven Finn M.D.   On: 12/10/2021 18:33   US RENAL  Result Date: 12/15/2021 CLINICAL DATA:  Renal dysfunction EXAM: RENAL / URINARY TRACT ULTRASOUND COMPLETE COMPARISON:  None. FINDINGS: Right Kidney: Not seen consistent with previous right nephrectomy. Left Kidney: Renal measurements: 12.2 x 5.5 x 4.9 cm = volume: 171 mL. There is no hydronephrosis. There is 11 mm cyst in the lateral margin of left kidney. Bladder: Indwelling Foley catheter is seen. Urinary bladder is not distended and not adequately evaluated. Other: None. IMPRESSION: Status post right nephrectomy. Left kidney shows no hydronephrosis. Small left renal cyst. Electronically Signed   By: Elmer Picker M.D.   On: 12/15/2021 13:08   ECHOCARDIOGRAM COMPLETE  Result Date: 12/12/2021    ECHOCARDIOGRAM REPORT   Patient Name:   DEBBIE BELLUCCI Date of  Exam: 12/12/2021 Medical Rec #:  [id number]    Height:       69.0 in Accession #:    [id number]   Weight:       206.3 lb Date of Birth:  1934/03/12    BSA:          2.094 m Patient Age:    53 years     BP:           112/42 mmHg Patient Gender: M            HR:           82 bpm. Exam Location:  [facility] Procedure: 2D Echo Indications:     Dyspnea R06.00  History:         Patient has prior history of Echocardiogram examinations. CHF,                  CAD, Aortic Valve Disease, Arrythmias:Atrial Fibrillation; Risk                  Factors:Hypertension.  Sonographer:     Wallace Keller Thornton-Maynard Referring Phys:  [id number] Kate Sable Diagnosing Phys: Kate Sable MD IMPRESSIONS  1. Left ventricular ejection fraction, by estimation, is 60 to 65%. The left ventricle has normal function. The left ventricle has no regional wall motion abnormalities. There is mild left ventricular hypertrophy. Left ventricular diastolic parameters are consistent with Grade I diastolic dysfunction (impaired relaxation).  2. Right ventricular systolic function is low normal. The right ventricular size is normal. There is normal pulmonary artery systolic pressure.  3. The mitral valve is grossly normal. Mild mitral valve regurgitation.  4. The aortic valve is calcified. Aortic valve regurgitation is trivial. Moderate aortic valve stenosis. Aortic valve area, by VTI measures 1.32 cm. Aortic valve mean gradient  measures 23.0 mmHg. Aortic valve Vmax measures 3.12 m/s.  5. Aortic dilatation noted. There is borderline dilatation of the aortic root, measuring 38 mm. FINDINGS  Left Ventricle: Left ventricular ejection fraction, by estimation, is 60 to 65%. The left ventricle has normal function. The left ventricle has no regional wall motion abnormalities. The left ventricular internal cavity size was normal in size. There is  mild left ventricular hypertrophy. Left ventricular diastolic parameters are consistent with Grade I diastolic  dysfunction (impaired relaxation). Right Ventricle: The right ventricular size is normal. No increase in right ventricular wall thickness. Right ventricular systolic function is low normal. There is normal pulmonary artery systolic pressure. The tricuspid regurgitant velocity is 2.85 m/s,  and with an assumed right atrial pressure of 3 mmHg, the estimated right ventricular systolic pressure is 86.3 mmHg. Left Atrium: Left atrial size was normal in size. Right Atrium: Right atrial size was normal in size. Pericardium: There is no evidence of pericardial effusion. Mitral Valve: The mitral valve is grossly normal. Mild mitral valve regurgitation. Tricuspid Valve: The tricuspid valve is not well visualized. Tricuspid valve regurgitation is not demonstrated. Aortic Valve: The aortic valve is calcified. Aortic valve regurgitation is trivial. Moderate aortic stenosis is present. Aortic valve mean gradient measures 23.0 mmHg. Aortic valve peak gradient measures 38.9 mmHg. Aortic valve area, by VTI measures 1.32  cm. Pulmonic Valve: The pulmonic valve was not well visualized. Pulmonic valve regurgitation is not visualized. Aorta: Aortic dilatation noted. There is borderline dilatation of the aortic root, measuring 38 mm. Venous: The inferior vena cava was not well visualized. IAS/Shunts: No atrial level shunt detected by color flow Doppler.  LEFT VENTRICLE PLAX 2D LVIDd:         4.50 cm     Diastology LVIDs:         3.00 cm     LV e' medial:    5.66 cm/s LV PW:         1.20 cm     LV E/e' medial:  13.0 LV IVS:        1.30 cm     LV e' lateral:   9.36 cm/s LVOT diam:     2.50 cm     LV E/e' lateral: 7.8 LV SV:         82 LV SV Index:   39 LVOT Area:     4.91 cm  LV Volumes (MOD) LV vol d, MOD A2C: 39.3 ml LV vol d, MOD A4C: 69.4 ml LV vol s, MOD A2C: 10.0 ml LV vol s, MOD A4C: 15.6 ml LV SV MOD A2C:     29.3 ml LV SV MOD A4C:     69.4 ml LV SV MOD BP:      41.0 ml RIGHT VENTRICLE RV S prime:     13.30 cm/s TAPSE (M-mode):  2.0 cm LEFT ATRIUM             Index LA diam:        3.70 cm 1.77 cm/m LA Vol (A2C):   60.8 ml 29.04 ml/m LA Vol (A4C):   68.3 ml 32.62 ml/m LA Biplane Vol: 65.3 ml 31.19 ml/m  AORTIC VALVE                     PULMONIC VALVE AV Area (Vmax):    1.38 cm      PV Vmax:       0.76 m/s AV Area (Vmean):   1.48 cm  PV Peak grad:  2.3 mmHg AV Area (VTI):     1.32 cm AV Vmax:           312.00 cm/s AV Vmean:          226.000 cm/s AV VTI:            0.620 m AV Peak Grad:      38.9 mmHg AV Mean Grad:      23.0 mmHg LVOT Vmax:         87.50 cm/s LVOT Vmean:        68.000 cm/s LVOT VTI:          0.167 m LVOT/AV VTI ratio: 0.27  AORTA Ao Root diam: 3.80 cm Ao Asc diam:  3.30 cm MITRAL VALVE               TRICUSPID VALVE MV Area (PHT): 3.17 cm    TR Peak grad:   32.5 mmHg MV Decel Time: 239 msec    TR Vmax:        285.00 cm/s MV E velocity: 73.30 cm/s MV A velocity: 94.30 cm/s  SHUNTS MV E/A ratio:  0.78        Systemic VTI:  0.17 m MV A Prime:    11.7 cm/s   Systemic Diam: 2.50 cm Kate Sable MD Electronically signed by Kate Sable MD Signature Date/Time: 12/12/2021/12:54:42 PM    Final     Microbiology: Recent Results (from the past 240 hour(s))  Urine Culture     Status: Abnormal   Collection Time: 12/10/21  9:30 PM   Specimen: Urine, Random  Result Value Ref Range Status   Specimen Description   Final    URINE, RANDOM Performed at [facility], [address] [place], [place] [address]    Special Requests   Final    NONE Performed at [facility], [address] [place], [place] [address]    Culture MULTIPLE SPECIES PRESENT, SUGGEST RECOLLECTION (A)  Final   Report Status 12/12/2021 FINAL  Final  Resp Panel by RT-PCR (Flu A&B, Covid) Nasopharyngeal Swab     Status: None   Collection Time: 12/11/21  3:00 AM   Specimen: Nasopharyngeal Swab; Nasopharyngeal(NP) swabs in vial transport medium  Result Value Ref Range Status   SARS Coronavirus 2 by RT PCR NEGATIVE  NEGATIVE Final    Comment: (NOTE) SARS-CoV-2 target nucleic acids are NOT DETECTED.  The SARS-CoV-2 RNA is generally detectable in upper respiratory specimens during the acute phase of infection. The lowest concentration of SARS-CoV-2 viral copies this assay can detect is 138 copies/mL. A negative result does not preclude SARS-Cov-2 infection and should not be used as the sole basis for treatment or other patient management decisions. A negative result may occur with  improper specimen collection/handling, submission of specimen other than nasopharyngeal swab, presence of viral mutation(s) within the areas targeted by this assay, and inadequate number of viral copies(<138 copies/mL). A negative result must be combined with clinical observations, patient history, and epidemiological information. The expected result is Negative.  Fact Sheet for Patients:  EntrepreneurPulse.com.au  Fact Sheet for Healthcare Providers:  IncredibleEmployment.be  This test is no t yet approved or cleared by the [place] FDA and  has been authorized for detection and/or diagnosis of SARS-CoV-2 by FDA under an Emergency Use Authorization (EUA). This EUA will remain  in effect (meaning this test can be used) for the duration of the COVID-19 declaration under Section 564(b)(1) of the Act, 21  U.S.C.section 360bbb-3(b)(1), unless the authorization is terminated  or revoked sooner.       Influenza A by PCR NEGATIVE NEGATIVE Final   Influenza B by PCR NEGATIVE NEGATIVE Final    Comment: (NOTE) The Xpert Xpress SARS-CoV-2/FLU/RSV plus assay is intended as an aid in the diagnosis of influenza from Nasopharyngeal swab specimens and should not be used as a sole basis for treatment. Nasal washings and aspirates are unacceptable for Xpert Xpress SARS-CoV-2/FLU/RSV testing.  Fact Sheet for Patients: EntrepreneurPulse.com.au  Fact Sheet for Healthcare  Providers: IncredibleEmployment.be  This test is not yet approved or cleared by the [place] FDA and has been authorized for detection and/or diagnosis of SARS-CoV-2 by FDA under an Emergency Use Authorization (EUA). This EUA will remain in effect (meaning this test can be used) for the duration of the COVID-19 declaration under Section 564(b)(1) of the Act, 21 U.S.C. section 360bbb-3(b)(1), unless the authorization is terminated or revoked.  Performed at [facility], [address] [place], [place] [address]   Culture, blood (Routine X 2) w Reflex to ID Panel     Status: None   Collection Time: 12/11/21  3:00 AM   Specimen: BLOOD  Result Value Ref Range Status   Specimen Description BLOOD BLOOD LEFT FOREARM  Final   Special Requests   Final    BOTTLES DRAWN AEROBIC AND ANAEROBIC Blood Culture results may not be optimal due to an inadequate volume of blood received in culture bottles   Culture   Final    NO GROWTH 5 DAYS Performed at [facility], [address] [place], [place] [address]    Report Status 12/16/2021 FINAL  Final  Culture, blood (Routine X 2) w Reflex to ID Panel     Status: None   Collection Time: 12/11/21  3:00 AM   Specimen: BLOOD  Result Value Ref Range Status   Specimen Description BLOOD LEFT ANTECUBITAL  Final   Special Requests   Final    BOTTLES DRAWN AEROBIC AND ANAEROBIC Blood Culture results may not be optimal due to an inadequate volume of blood received in culture bottles   Culture   Final    NO GROWTH 5 DAYS Performed at [facility], [address] [place], [place] [address]    Report Status 12/16/2021 FINAL  Final  Resp Panel by RT-PCR (Flu A&B, Covid) Nasopharyngeal Swab     Status: None   Collection Time: 12/16/21 10:30 AM   Specimen: Nasopharyngeal Swab; Nasopharyngeal(NP) swabs in vial transport medium  Result Value Ref Range Status   SARS Coronavirus 2 by RT PCR NEGATIVE NEGATIVE  Final    Comment: (NOTE) SARS-CoV-2 target nucleic acids are NOT DETECTED.  The SARS-CoV-2 RNA is generally detectable in upper respiratory specimens during the acute phase of infection. The lowest concentration of SARS-CoV-2 viral copies this assay can detect is 138 copies/mL. A negative result does not preclude SARS-Cov-2 infection and should not be used as the sole basis for treatment or other patient management decisions. A negative result may occur with  improper specimen collection/handling, submission of specimen other than nasopharyngeal swab, presence of viral mutation(s) within the areas targeted by this assay, and inadequate number of viral copies(<138 copies/mL). A negative result must be combined with clinical observations, patient history, and epidemiological information. The expected result is Negative.  Fact Sheet for Patients:  EntrepreneurPulse.com.au  Fact Sheet for Healthcare Providers:  IncredibleEmployment.be  This test is no t yet approved or cleared by the [place]  States FDA and  has been authorized for detection and/or diagnosis of SARS-CoV-2 by FDA under an Emergency Use Authorization (EUA). This EUA will remain  in effect (meaning this test can be used) for the duration of the COVID-19 declaration under Section 564(b)(1) of the Act, 21 U.S.C.section 360bbb-3(b)(1), unless the authorization is terminated  or revoked sooner.       Influenza A by PCR NEGATIVE NEGATIVE Final   Influenza B by PCR NEGATIVE NEGATIVE Final    Comment: (NOTE) The Xpert Xpress SARS-CoV-2/FLU/RSV plus assay is intended as an aid in the diagnosis of influenza from Nasopharyngeal swab specimens and should not be used as a sole basis for treatment. Nasal washings and aspirates are unacceptable for Xpert Xpress SARS-CoV-2/FLU/RSV testing.  Fact Sheet for Patients: EntrepreneurPulse.com.au  Fact Sheet for Healthcare  Providers: IncredibleEmployment.be  This test is not yet approved or cleared by the [place] FDA and has been authorized for detection and/or diagnosis of SARS-CoV-2 by FDA under an Emergency Use Authorization (EUA). This EUA will remain in effect (meaning this test can be used) for the duration of the COVID-19 declaration under Section 564(b)(1) of the Act, 21 U.S.C. section 360bbb-3(b)(1), unless the authorization is terminated or revoked.  Performed at [facility], [address] [place], [place] [address]      Labs: CBC: Recent Labs  Lab 12/10/21 1639 12/11/21 0547 12/12/21 0451 12/13/21 0545 12/14/21 0506 12/16/21 1000  WBC 6.2 4.7 4.1 4.1 3.7* 5.0  NEUTROABS 4.9  --  2.9 2.8 2.3  --   HGB 9.2* 7.4* 7.8* 7.4* 7.7* 8.8*  HCT 28.8* 23.0* 24.2* 22.7* 23.4* 27.0*  MCV 102.1* 100.9* 99.2 97.8 99.2 99.6  PLT 209 194 209 229 234 604   Basic Metabolic Panel: Recent Labs  Lab 12/12/21 0451 12/13/21 0545 12/14/21 0506 12/15/21 0625 12/16/21 1000  NA 139 138 136 134* 135  K 3.5 3.6 3.7 3.8 4.1  CL 99 97* 97* 95* 95*  CO2 32 33* 33* 32 32  GLUCOSE 98 102* 98 93 109*  BUN 20 19 20 19  24*  CREATININE 1.00 1.20 1.23 1.31* 1.33*  CALCIUM 7.9* 8.0* 7.8* 8.0* 8.4*  MG 2.0 1.9 2.0  --  2.2  PHOS  --   --   --   --  4.3   Liver Function Tests: Recent Labs  Lab 12/10/21 1639  AST 19  ALT 11  ALKPHOS 56  BILITOT 0.6  PROT 6.2*  ALBUMIN 3.2*   No results for input(s): LIPASE, AMYLASE in the last 168 hours. No results for input(s): AMMONIA in the last 168 hours. Cardiac Enzymes: No results for input(s): CKTOTAL, CKMB, CKMBINDEX, TROPONINI in the last 168 hours. BNP (last 3 results) Recent Labs    09/11/21 1405 10/05/21 1254 12/10/21 1647  BNP 442.7* 216.0* 585.0*   CBG: No results for input(s): GLUCAP in the last 168 hours.  Time spent: 35 minutes  Signed:  Val Riles  [facility]  12/16/2021 12:58 PM

## 2021-12-17 DIAGNOSIS — I5030 Unspecified diastolic (congestive) heart failure: Secondary | ICD-10-CM | POA: Diagnosis not present

## 2021-12-17 DIAGNOSIS — R339 Retention of urine, unspecified: Secondary | ICD-10-CM | POA: Diagnosis not present

## 2021-12-17 DIAGNOSIS — G4700 Insomnia, unspecified: Secondary | ICD-10-CM | POA: Diagnosis not present

## 2021-12-17 DIAGNOSIS — R52 Pain, unspecified: Secondary | ICD-10-CM | POA: Diagnosis not present

## 2021-12-17 DIAGNOSIS — I4891 Unspecified atrial fibrillation: Secondary | ICD-10-CM | POA: Diagnosis not present

## 2021-12-17 DIAGNOSIS — N179 Acute kidney failure, unspecified: Secondary | ICD-10-CM | POA: Diagnosis not present

## 2021-12-17 DIAGNOSIS — D509 Iron deficiency anemia, unspecified: Secondary | ICD-10-CM | POA: Diagnosis not present

## 2021-12-17 DIAGNOSIS — L89302 Pressure ulcer of unspecified buttock, stage 2: Secondary | ICD-10-CM | POA: Diagnosis not present

## 2021-12-20 DIAGNOSIS — L299 Pruritus, unspecified: Secondary | ICD-10-CM | POA: Diagnosis not present

## 2021-12-21 DIAGNOSIS — I5030 Unspecified diastolic (congestive) heart failure: Secondary | ICD-10-CM | POA: Diagnosis not present

## 2021-12-21 DIAGNOSIS — R339 Retention of urine, unspecified: Secondary | ICD-10-CM | POA: Diagnosis not present

## 2021-12-21 DIAGNOSIS — J449 Chronic obstructive pulmonary disease, unspecified: Secondary | ICD-10-CM | POA: Diagnosis not present

## 2021-12-21 DIAGNOSIS — F339 Major depressive disorder, recurrent, unspecified: Secondary | ICD-10-CM | POA: Diagnosis not present

## 2021-12-21 DIAGNOSIS — D509 Iron deficiency anemia, unspecified: Secondary | ICD-10-CM | POA: Diagnosis not present

## 2021-12-21 DIAGNOSIS — N179 Acute kidney failure, unspecified: Secondary | ICD-10-CM | POA: Diagnosis not present

## 2021-12-21 DIAGNOSIS — I4891 Unspecified atrial fibrillation: Secondary | ICD-10-CM | POA: Diagnosis not present

## 2021-12-21 DIAGNOSIS — G4700 Insomnia, unspecified: Secondary | ICD-10-CM | POA: Diagnosis not present

## 2021-12-21 DIAGNOSIS — R52 Pain, unspecified: Secondary | ICD-10-CM | POA: Diagnosis not present

## 2021-12-22 ENCOUNTER — Other Ambulatory Visit: Payer: Self-pay

## 2021-12-22 ENCOUNTER — Ambulatory Visit (INDEPENDENT_AMBULATORY_CARE_PROVIDER_SITE_OTHER): Payer: Medicare PPO | Admitting: *Deleted

## 2021-12-22 VITALS — BP 96/61 | HR 72 | Temp 98.0°F | Resp 16

## 2021-12-22 DIAGNOSIS — J4550 Severe persistent asthma, uncomplicated: Secondary | ICD-10-CM | POA: Diagnosis not present

## 2021-12-22 DIAGNOSIS — J4540 Moderate persistent asthma, uncomplicated: Secondary | ICD-10-CM

## 2021-12-22 MED ORDER — METHYLPREDNISOLONE SODIUM SUCC 125 MG IJ SOLR: 125.0000 mg | Freq: Once | INTRAMUSCULAR | Status: DC | PRN

## 2021-12-22 MED ORDER — FAMOTIDINE IN NACL 20-0.9 MG/50ML-% IV SOLN: 20.0000 mg | Freq: Once | INTRAVENOUS | Status: DC | PRN

## 2021-12-22 MED ORDER — SODIUM CHLORIDE 0.9 % IV SOLN: Freq: Once | INTRAVENOUS | Status: DC | PRN

## 2021-12-22 MED ORDER — EPINEPHRINE 0.3 MG/0.3ML IJ SOAJ: 0.3000 mg | Freq: Once | INTRAMUSCULAR | Status: DC | PRN

## 2021-12-22 MED ORDER — OMALIZUMAB 75 MG/0.5ML ~~LOC~~ SOSY
375.0000 mg | PREFILLED_SYRINGE | Freq: Once | SUBCUTANEOUS | Status: AC
  Administered 2021-12-22: 15:00:00 375 mg via SUBCUTANEOUS
  Filled 2021-12-22: qty 0.5

## 2021-12-22 MED ORDER — ALBUTEROL SULFATE HFA 108 (90 BASE) MCG/ACT IN AERS: 2.0000 | INHALATION_SPRAY | Freq: Once | RESPIRATORY_TRACT | Status: DC | PRN

## 2021-12-22 MED ORDER — DIPHENHYDRAMINE HCL 50 MG/ML IJ SOLN: 50.0000 mg | Freq: Once | INTRAMUSCULAR | Status: DC | PRN

## 2021-12-22 NOTE — Progress Notes (Signed)
Diagnosis: Asthma  Provider:  Marshell Garfinkel, MD  Procedure: Injection  Xolair (Omalizumab), Dose: 375 mg, Site: subcutaneous, Number of injections: 3  Discharge: Condition: Good, Destination: Home . AVS provided to patient.   Performed by:  Oren Beckmann, RN

## 2021-12-23 ENCOUNTER — Encounter: Payer: Self-pay | Admitting: Medical

## 2021-12-23 ENCOUNTER — Ambulatory Visit (INDEPENDENT_AMBULATORY_CARE_PROVIDER_SITE_OTHER): Payer: Medicare PPO | Admitting: Medical

## 2021-12-23 VITALS — BP 110/56 | HR 74 | Ht 69.0 in | Wt 196.0 lb

## 2021-12-23 DIAGNOSIS — I5030 Unspecified diastolic (congestive) heart failure: Secondary | ICD-10-CM | POA: Diagnosis not present

## 2021-12-23 DIAGNOSIS — I2510 Atherosclerotic heart disease of native coronary artery without angina pectoris: Secondary | ICD-10-CM

## 2021-12-23 DIAGNOSIS — I4819 Other persistent atrial fibrillation: Secondary | ICD-10-CM

## 2021-12-23 DIAGNOSIS — I359 Nonrheumatic aortic valve disorder, unspecified: Secondary | ICD-10-CM | POA: Diagnosis not present

## 2021-12-23 DIAGNOSIS — R531 Weakness: Secondary | ICD-10-CM | POA: Diagnosis not present

## 2021-12-23 DIAGNOSIS — N179 Acute kidney failure, unspecified: Secondary | ICD-10-CM | POA: Diagnosis not present

## 2021-12-23 DIAGNOSIS — D509 Iron deficiency anemia, unspecified: Secondary | ICD-10-CM | POA: Diagnosis not present

## 2021-12-23 DIAGNOSIS — I5032 Chronic diastolic (congestive) heart failure: Secondary | ICD-10-CM

## 2021-12-23 DIAGNOSIS — L299 Pruritus, unspecified: Secondary | ICD-10-CM | POA: Diagnosis not present

## 2021-12-23 MED ORDER — MIDODRINE HCL 2.5 MG PO TABS: 2.5000 mg | ORAL_TABLET | Freq: Three times a day (TID) | ORAL | 0 refills | Status: DC

## 2021-12-23 MED ORDER — MIDODRINE HCL 2.5 MG PO TABS: 2.5000 mg | ORAL_TABLET | Freq: Three times a day (TID) | ORAL | 0 refills | Status: AC

## 2021-12-23 NOTE — Progress Notes (Signed)
Cardiology Office Note:    Date:  12/24/2021   ID:  TERMAINE [name], DOB [birth year], MRN [id number]  PCP:  [name], MD  [facility] Cardiologist:  Kathlyn Sacramento, MD  [place] Electrophysiologist:  None   Referring MD: [name], MD   Chief Complaint: Hospital follow-up   History of Present Illness:    [name] is a 88 y.o. male with a hx of with history of HFpEF, atrial fibrillation on Eliquis, mod aortic stenosis, hyperlipidemia, prostate cancer s/p prostatectomy, s/p Rt renal nephrectomy, chronic indwelling Foley catheter who was recently admitted for CHF.  Patient had chronic decline over the past several weeks.  He was admitted 2 months ago with UTI.  Discharged to short-term rehab and subsequently to home with physical therapy.  He states doing relatively well about a month ago. Over the past 2 weeks, he noticed worsening weakness unable to stand, fatigue.  His home physical therapy is advised him to come to the emergency room.In the ED, he was diagnosed with a UTI, chest x-ray showed pulmonary edema, possible pneumonia.  He was started on IV Lasix and antibiotics.  Hemoglobin this morning was 7.4, 9.2 . Eliquis was discontinued due to concern for fall and drop in Hgb.  EKG shows sinus rhythm, bifascicular block. He had low BP and was started on midodrine. Sent to a rehab facility on lasix $Remove'40mg'dzPkTlA$  daily.   Today, the patient reports he has been doing well since discharge. Has some frustrations with the rehab center. No chest pain or shortness of breath. NO LLE., orthopnea, pnd . Hgb on d/c was 8.8, he is off Eliquis. Says he has been having blood draws every day, unsure as to why. Says he can do better with low salt diet. He was on lasix $Remove'40mg'PfguESe$  before the hospital   Past Medical History:  Diagnosis Date   (HFpEF) heart failure with preserved ejection fraction ([place])    a. 02/2019 Echo: Ef 60-65%, mildly reduced RV fxn. RVSP 51mmHg. Mildly dil LA. Mod dil RA. Mild  to mod TR. Mild to mod AS. Triv AI.   Allergy    Anemia    Anxiety    Arthritis    rheumatoid   Asthma    Basal cell carcinoma    back   CAD (coronary artery disease)    CHF (congestive heart failure) (HCC)    Chronic kidney disease    Mass right kidney   Chronic sinusitis    Collagen vascular disease (HCC)    Rheumatoid Arthritis   Depression    Diverticulitis    Dysrhythmia    ED (erectile dysfunction)    GERD (gastroesophageal reflux disease)    History of SIADH    Hx of adenomatous colonic polyps    Hypertension    Hyponatremia    IBS (irritable bowel syndrome)    Neurodermatitis    Neurogenic bladder    OSA (obstructive sleep apnea)    no CPAP since weight loss   Persistent atrial fibrillation ([place])    a. Dx 02/2019; b. CHA2DS2VASc = 6-->eliquis initiated.   Prostate cancer [facility])    Renal mass    10/2018 4.2cm R renal cortical mass. Most compatible w/ clear cell renal cell carcinoma.    Past Surgical History:  Procedure Laterality Date   CARDIOVERSION N/A 04/23/2019   Procedure: CARDIOVERSION;  Surgeon: Nelva Bush, MD;  Location: [facility] ORS;  Service: Cardiovascular;  Laterality: N/A;   KNEE ARTHROPLASTY Right 09/02/2015  Procedure: COMPUTER ASSISTED TOTAL KNEE ARTHROPLASTY;  Surgeon: [name], MD;  Location: [facility] ORS;  Service: Orthopedics;  Laterality: Right;   LAPAROSCOPIC NEPHRECTOMY, HAND ASSISTED Right 06/17/2019   Procedure: HAND ASSISTED LAPAROSCOPIC NEPHRECTOMY;  Surgeon: Hollice Espy, MD;  Location: [facility] ORS;  Service: Urology;  Laterality: Right;   NASAL SINUS SURGERY  2009   DEVIATED SEPTUM AND POLYPS   permanent indwelling catheter     PROSTATE SURGERY     PROSTATECTOMY   TOTAL KNEE ARTHROPLASTY Left 9/15   Dr [name]   URETHRAL STRICTURE DILATATION  02-2010   Dr.Cope    Current Medications: Current Meds  Medication Sig   acetaminophen (TYLENOL) 500 MG tablet Take 500 mg by mouth 3 (three) times daily as needed for mild pain or  moderate pain.   albuterol (VENTOLIN HFA) 108 (90 Base) MCG/ACT inhaler Inhale 2 puffs into the lungs every 6 (six) hours as needed for wheezing or shortness of breath.   ALOE PO Take 400 mg by mouth daily.   ALPRAZolam (XANAX) 0.5 MG tablet Take 1 tablet (0.5 mg total) by mouth at bedtime.   Ascorbic Acid (VITAMIN C) 1000 MG tablet Take 1,000 mg by mouth daily.   cetirizine (ZYRTEC) 10 MG tablet Take 10 mg by mouth at bedtime.   chlorhexidine (PERIDEX) 0.12 % solution Use as directed 15 mLs in the mouth or throat daily as needed.   Cholecalciferol (VITAMIN D3) 50 MCG (2000 UT) TABS Take 2,000 Units by mouth daily.    Coenzyme Q10 (CO Q 10) 100 MG CAPS Take 100 mg by mouth daily.    Cranberry 250 MG CHEW Chew 250 mg by mouth daily.   EPINEPHrine 0.3 mg/0.3 mL IJ SOAJ injection Inject 0.3 mg into the muscle as needed for anaphylaxis.   fluconazole (DIFLUCAN) 100 MG tablet Take 1 tablet (100 mg total) by mouth daily.   fluticasone (FLONASE) 50 MCG/ACT nasal spray Place 2 sprays into both nostrils 2 (two) times daily.   folic acid (FOLVITE) 916 MCG tablet Take 1,200 mcg by mouth daily.   furosemide (LASIX) 40 MG tablet Take 1 tablet (40 mg total) by mouth daily. Skip dose if SBP less than 120 mmHg   gabapentin (NEURONTIN) 600 MG tablet Take 600 mg by mouth 2 (two) times daily at 8 am and 10 pm.   hydroxychloroquine (PLAQUENIL) 200 MG tablet Take 200 mg by mouth 2 (two) times daily.    iron polysaccharides (NIFEREX) 150 MG capsule Take 150 mg by mouth daily.   ketoconazole (NIZORAL) 2 % cream Apply 1 application topically 2 (two) times daily.   ketoconazole (NIZORAL) 2 % shampoo Apply 1 application topically 2 (two) times a week.   methotrexate (RHEUMATREX) 2.5 MG tablet Take 25 mg by mouth every Wednesday.   metoprolol succinate (TOPROL-XL) 25 MG 24 hr tablet Take 0.5 tablets (12.5 mg total) by mouth 2 (two) times daily. Skip dose if systolic BP less than 945 mmHg and/or heart rate less than 65    milk thistle 175 MG tablet Take 175 mg by mouth daily.   Multiple Vitamin (MULTIVITAMIN) tablet Take 1 tablet by mouth daily.   MYRBETRIQ 50 MG TB24 tablet TAKE 1 TABLET(50 MG) BY MOUTH DAILY   nystatin cream (MYCOSTATIN) Apply 1 application topically 2 (two) times daily.   Omega 3 1200 MG CAPS Take 1 capsule by mouth daily.   omeprazole (PRILOSEC) 20 MG capsule Take 1 capsule (20 mg total) by mouth daily as needed.   ondansetron (  ZOFRAN) 4 MG tablet Take 4 mg by mouth every 6 (six) hours as needed for nausea or vomiting.   polyethylene glycol powder (GLYCOLAX/MIRALAX) 17 GM/SCOOP powder Take 17 g by mouth at bedtime.   pravastatin (PRAVACHOL) 20 MG tablet TAKE 1 TABLET(20 MG) BY MOUTH AT BEDTIME   Probiotic Product (ALIGN) 4 MG CAPS Take 1 capsule (4 mg total) by mouth daily.   QUEtiapine (SEROQUEL) 25 MG tablet Take 1 tablet (25 mg total) by mouth at bedtime.   Selenium 200 MCG CAPS Take 200 mcg by mouth daily.   triamcinolone ointment (KENALOG) 0.1 % Apply 1 application topically as needed (skin irritation).   Turmeric 500 MG CAPS Take 500 mg by mouth daily.    vitamin B-12 (CYANOCOBALAMIN) 1000 MCG tablet Take 1,000 mcg by mouth daily.   Zinc 50 MG TABS Take 1 tablet by mouth daily.   [DISCONTINUED] midodrine (PROAMATINE) 5 MG tablet Take 1 tablet (5 mg total) by mouth 3 (three) times daily with meals. Skip dose if SBP greater than 110 mmHg and/or heart rate greater than 100     Allergies:   Chlorhexidine, Ciprofloxacin, Citalopram hydrobromide, Clindamycin, Lorazepam, Paroxetine, Ramipril, Simvastatin, and Sulfa antibiotics   Social History   Socioeconomic History   Marital status: Married    Spouse name: Not on file   Number of children: 2   Years of education: Not on file   Highest education level: Not on file  Occupational History   Occupation: Radio Licensed conveyancer    Comment: retired  Tobacco Use   Smoking status: Never   Smokeless tobacco: Never  Scientific laboratory technician Use:  Never used  Substance and Sexual Activity   Alcohol use: Yes    Comment: 5 days out of 7 drinks scotch   Drug use: No   Sexual activity: Not Currently  Other Topics Concern   Not on file  Social History Narrative   Not sure about a living will or health care POA   Wife should make health care decisions for him--then daughter, then son   Would accept resuscitation attempts   Not sure about tube feeds   Social Determinants of Health   Financial Resource Strain: Not on file  Food Insecurity: Not on file  Transportation Needs: Not on file  Physical Activity: Not on file  Stress: Not on file  Social Connections: Not on file     Family History: The patient's =family history includes Heart disease (age of onset: 98) in his mother; Heart failure in his mother; Pneumonia (age of onset: 44) in his father; Skin cancer in his father, sister, and son. There is no history of Colon cancer, Esophageal cancer, Stomach cancer, Pancreatic cancer, or Liver disease.  ROS:   Please see the history of present illness.    = All other systems reviewed and are negative.  EKGs/Labs/Other Studies Reviewed:    The following studies were reviewed today:  Echo 12/12/21  1. Left ventricular ejection fraction, by estimation, is 60 to 65%. The  left ventricle has normal function. The left ventricle has no regional  wall motion abnormalities. There is mild left ventricular hypertrophy.  Left ventricular diastolic parameters  are consistent with Grade I diastolic dysfunction (impaired relaxation).   2. Right ventricular systolic function is low normal. The right  ventricular size is normal. There is normal pulmonary artery systolic  pressure.   3. The mitral valve is grossly normal. Mild mitral valve regurgitation.   4. The aortic valve  is calcified. Aortic valve regurgitation is trivial.  Moderate aortic valve stenosis. Aortic valve area, by VTI measures 1.32  cm. Aortic valve mean gradient measures 23.0  mmHg. Aortic valve Vmax  measures 3.12 m/s.   5. Aortic dilatation noted. There is borderline dilatation of the aortic  root, measuring 38 mm.   EKG:  EKG is  ordered today.  The ekg ordered today demonstrates NSR, 74bpm, RBBB, LAFB, LAD, nonspecific T wave changes  Recent Labs: 12/10/2021: ALT 11; B Natriuretic Peptide 585.0 12/16/2021: Magnesium 2.2 12/23/2021: BUN 26; Creatinine, Ser 1.65; Hemoglobin 10.0; Platelets 167; Potassium 5.0; Sodium 142  Recent Lipid Panel    Component Value Date/Time   CHOL 115 05/11/2020 1648   TRIG 60.0 05/11/2020 1648   HDL 56.50 05/11/2020 1648   CHOLHDL 2 05/11/2020 1648   VLDL 12.0 05/11/2020 1648   LDLCALC 47 05/11/2020 1648       Physical Exam:    VS:  BP (!) 110/56 (BP Location: Left Arm, Patient Position: Sitting, Cuff Size: Normal)    Pulse 74    Ht 5\' 9"  (1.753 m)    Wt 196 lb (88.9 kg)    SpO2 99%    BMI 28.94 kg/m     Wt Readings from Last 3 Encounters:  12/23/21 196 lb (88.9 kg)  12/14/21 204 lb 2.3 oz (92.6 kg)  11/16/21 200 lb (90.7 kg)     GEN:  Well nourished, well developed in no acute distress HEENT: Normal NECK: No JVD; No carotid bruits LYMPHATICS: No lymphadenopathy CARDIAC: RRR, + murmur, no rubs, gallops RESPIRATORY:  crackles at base  ABDOMEN: Soft, non-tender, non-distended MUSCULOSKELETAL:  No edema; No deformity  SKIN: Warm and dry NEUROLOGIC:  Alert and oriented x 3 PSYCHIATRIC:  Normal affect   ASSESSMENT:    1. Persistent atrial fibrillation ([place])   2. Atherosclerosis of native coronary artery of native heart without angina pectoris   3. Chronic diastolic heart failure ([place])   4. Aortic valve disorder   5. AKI (acute kidney injury) ([place])   6. Weakness    PLAN:    In order of problems listed above:  HFpEF No LLE on exam, some crackles in the lungs, no JVD noted. Overall weight is down from discharge. He is at a rehab facility. Recent hospitalization with volume overload may be from multiple  admissions/IVF in the last few months. Trying to eat low salt, can improve on this. He is on lasix 40mg  daily. I will check BMET and BNP today. Continue BB.   Persistent Afib Patient is in NSR today. Continue rate control with Metoprolol 12.5mg  BID. Eliquis held for anemia with Hgb 7.4  in the hospital and high fall risk. Re-check CBC today. Patient is still high fall risk given severe weakness. I will discuss the possibility of re-starting Eliquis with MD.  Hypotension Bps form the rehab center reviewed. I will decrease midodrine to 2.5mg  TID. I requested they notify the office if Bps are too low.   AKI Re-check BMET today.   Weakness/physical deconditioning He has 3 admission in the last few months, very weak. He is at a rehab facility until certain goals are met.   Mod AI Mild AI seen on echo in 2021. Echo during recent admission showed moderate AI with a mean gradient of 57mmHg with borderline dilation of the aortic root 73mm. Will need to follow with serial echocardiograms.   Disposition: Follow up in 1 month(s) with MD/APP     Signed, Lalena Salas H  Jorene Minors  12/24/2021 1:13 PM    [facility]

## 2021-12-23 NOTE — Patient Instructions (Signed)
Medication Instructions:   Your physician has recommended you make the following change in your medication:   DECREASE Midodrine - Take one tablet (2.5mg ) by mouth daily.   *If you need a refill on your cardiac medications before your next appointment, please call your pharmacy*   Lab Work:  BNP, BMP, CBC  If you have labs (blood work) drawn today and your tests are completely normal, you will receive your results only by: [place] (if you have MyChart) OR A paper copy in the mail If you have any lab test that is abnormal or we need to change your treatment, we will call you to review the results.   Testing/Procedures:  1.None Ordered   Follow-Up: At [facility], you and your health needs are our priority.  As part of our continuing mission to provide you with exceptional heart care, we have created designated Provider Care Teams.  These Care Teams include your primary Cardiologist (physician) and Advanced Practice Providers (APPs -  Physician Assistants and Nurse Practitioners) who all work together to provide you with the care you need, when you need it.  We recommend signing up for the patient portal called "MyChart".  Sign up information is provided on this After Visit Summary.  MyChart is used to connect with patients for Virtual Visits (Telemedicine).  Patients are able to view lab/test results, encounter notes, upcoming appointments, etc.  Non-urgent messages can be sent to your provider as well.   To learn more about what you can do with MyChart, go to [email or web address].    Your next appointment:   1 month(s)  The format for your next appointment:   In Person  Provider:   You may see Kathlyn Sacramento, MD or one of the following Advanced Practice Providers on your designated Care Team:   Murray Hodgkins, NP Christell Faith, PA-C Cadence Kathlen Mody, [place]

## 2021-12-24 LAB — BASIC METABOLIC PANEL
BUN/Creatinine Ratio: 16 (ref 10–24)
BUN: 26 mg/dL (ref 8–27)
CO2: 27 mmol/L (ref 20–29)
Calcium: 8.9 mg/dL (ref 8.6–10.2)
Chloride: 102 mmol/L (ref 96–106)
Creatinine, Ser: 1.65 mg/dL — ABNORMAL HIGH (ref 0.76–1.27)
Glucose: 114 mg/dL — ABNORMAL HIGH (ref 70–99)
Potassium: 5 mmol/L (ref 3.5–5.2)
Sodium: 142 mmol/L (ref 134–144)
eGFR: 40 mL/min/{1.73_m2} — ABNORMAL LOW (ref >59)

## 2021-12-24 LAB — CBC
Hematocrit: 30.8 % — ABNORMAL LOW (ref 37.5–51.0)
Hemoglobin: 10 g/dL — ABNORMAL LOW (ref 13.0–17.7)
MCH: 31.4 pg (ref 26.6–33.0)
MCHC: 32.5 g/dL (ref 31.5–35.7)
MCV: 97 fL (ref 79–97)
Platelets: 167 10*3/uL (ref 150–450)
RBC: 3.18 x10E6/uL — ABNORMAL LOW (ref 4.14–5.80)
RDW: 15.6 % — ABNORMAL HIGH (ref 11.6–15.4)
WBC: 9.4 10*3/uL (ref 3.4–10.8)

## 2021-12-24 LAB — BRAIN NATRIURETIC PEPTIDE: BNP: 107.8 pg/mL — ABNORMAL HIGH (ref 0.0–100.0)

## 2021-12-26 ENCOUNTER — Other Ambulatory Visit: Payer: Self-pay | Admitting: Pharmacy Technician

## 2021-12-27 ENCOUNTER — Telehealth: Payer: Self-pay | Admitting: Internal Medicine

## 2021-12-27 NOTE — Telephone Encounter (Signed)
error 

## 2021-12-28 ENCOUNTER — Ambulatory Visit: Payer: Medicare PPO | Admitting: Family

## 2021-12-28 ENCOUNTER — Telehealth: Payer: Self-pay | Admitting: Family

## 2021-12-28 ENCOUNTER — Telehealth: Payer: Self-pay | Admitting: Emergency Medicine

## 2021-12-28 DIAGNOSIS — L299 Pruritus, unspecified: Secondary | ICD-10-CM | POA: Diagnosis not present

## 2021-12-28 DIAGNOSIS — L89302 Pressure ulcer of unspecified buttock, stage 2: Secondary | ICD-10-CM | POA: Diagnosis not present

## 2021-12-28 DIAGNOSIS — I5030 Unspecified diastolic (congestive) heart failure: Secondary | ICD-10-CM | POA: Diagnosis not present

## 2021-12-28 NOTE — Telephone Encounter (Signed)
Called pt's daughter, [name].   Let her know that they will re-draw labs at [facility] and fax results.   Melinda voiced appreciation for the call and understands that they can reach out to our office with any questions or concerns.

## 2021-12-28 NOTE — Telephone Encounter (Signed)
-----   Message from [place], PA-C sent at 12/24/2021  2:13 PM EST ----- Labs showed kidney function mildly worse, however not wanting to change the diuretics. Increase fluid intake and can we re-check BMET in a week.

## 2021-12-28 NOTE — Telephone Encounter (Signed)
Patient did not show for his [facility] appointment on 12/28/21. Will attempt to reschedule.

## 2021-12-30 ENCOUNTER — Inpatient Hospital Stay: Payer: Medicare PPO | Admitting: Internal Medicine

## 2021-12-31 DIAGNOSIS — M25561 Pain in right knee: Secondary | ICD-10-CM | POA: Diagnosis not present

## 2021-12-31 DIAGNOSIS — I5030 Unspecified diastolic (congestive) heart failure: Secondary | ICD-10-CM | POA: Diagnosis not present

## 2022-01-03 DIAGNOSIS — C61 Malignant neoplasm of prostate: Secondary | ICD-10-CM | POA: Diagnosis not present

## 2022-01-03 DIAGNOSIS — R1030 Lower abdominal pain, unspecified: Secondary | ICD-10-CM | POA: Diagnosis not present

## 2022-01-03 DIAGNOSIS — R339 Retention of urine, unspecified: Secondary | ICD-10-CM | POA: Diagnosis not present

## 2022-01-03 NOTE — Progress Notes (Signed)
01/04/2022 9:51 PM   [name] 06-Jun-1934 [id number]  Referring provider: [name], MD [address] [place],  [place] [address]  Chief Complaint  Patient presents with   Urinary Retention   Urological history: 1. Prostate cancer -radical prostatectomy 1993 -salvage radiation 1998  2. Small capacity bladder -developed after salvage radiation 1998 -cysto 2019 -  Penile bulbar urethra normal.  Bladder neck was open.  He had grade 1 or 2 trabeculation  3. Renal cell carcinoma -Surgical pathology consistent with pT3aNx , Furhman grade 2 with invasion into the renal sinus. -right radical nephrectomy 2020   HPI: [name] is a 88 y.o. male who presents today for Foley catheter exchange.  History also obtained through daughter, [name], by phone.   He states he has been having issues with constipation and having tenesmus every 30 to 40 minutes.  He states staff at [facility] told him that his urine Foley catheter was not draining appropriately and the CMA's manipulated during the night.  He then states one of the nurses irrigated the catheter and it started to drain appropriately.  He was still experiencing suprapubic discomfort, so they obtained a urinalysis which he was told part 1 was positive.   They also performed an x-ray and it was noted that he had severe constipation.  He is currently on a bowel regimen.  His urinalysis was included in today's notes it was nitrate positive, with 5-10 RBCs, 21-50 WBCs and white blood cell clumps.  He is currently on Macrobid twice daily.  Per Liz Malady, nurse practitioner pulmonary results are for mixed urogenital flora.  When his current catheter was removed, the catheter tubing appeared longer than one would expect and he experienced pain when deflating the balloon making me concerned that it was blown up in the prostatic fossa.  There was blood on the tip of the catheter, but I did not appreciate any  sediment.  When I attempted to replace the Foley catheter, I could not advance it easily and felt strong resistance when attempting to inflate the balloon.  I then removed the catheter noted to have folded upon itself.  I then attempted a 67 [place] coud catheter which passed, but it could not be irrigated easily.   I then tried to pass a 16 french Mill[place] and a 14 french coude catheter that would resulting in urine spraying around the catheter.       PMH: Past Medical History:  Diagnosis Date   (HFpEF) heart failure with preserved ejection fraction ([place])    a. 02/2019 Echo: Ef 60-65%, mildly reduced RV fxn. RVSP 13mmHg. Mildly dil LA. Mod dil RA. Mild to mod TR. Mild to mod AS. Triv AI.   Allergy    Anemia    Anxiety    Arthritis    rheumatoid   Asthma    Basal cell carcinoma    back   CAD (coronary artery disease)    CHF (congestive heart failure) (HCC)    Chronic kidney disease    Mass right kidney   Chronic sinusitis    Collagen vascular disease (HCC)    Rheumatoid Arthritis   Depression    Diverticulitis    Dysrhythmia    ED (erectile dysfunction)    GERD (gastroesophageal reflux disease)    History of SIADH    Hx of adenomatous colonic polyps    Hypertension    Hyponatremia    IBS (irritable bowel syndrome)  Neurodermatitis    Neurogenic bladder    OSA (obstructive sleep apnea)    no CPAP since weight loss   Persistent atrial fibrillation ([place])    a. Dx 02/2019; b. CHA2DS2VASc = 6-->eliquis initiated.   Prostate cancer [facility])    Renal mass    10/2018 4.2cm R renal cortical mass. Most compatible w/ clear cell renal cell carcinoma.    Surgical History: Past Surgical History:  Procedure Laterality Date   CARDIOVERSION N/A 04/23/2019   Procedure: CARDIOVERSION;  Surgeon: Nelva Bush, MD;  Location: [facility] ORS;  Service: Cardiovascular;  Laterality: N/A;   KNEE ARTHROPLASTY Right 09/02/2015   Procedure: COMPUTER ASSISTED TOTAL KNEE ARTHROPLASTY;  Surgeon:  [name], MD;  Location: [facility] ORS;  Service: Orthopedics;  Laterality: Right;   LAPAROSCOPIC NEPHRECTOMY, HAND ASSISTED Right 06/17/2019   Procedure: HAND ASSISTED LAPAROSCOPIC NEPHRECTOMY;  Surgeon: Hollice Espy, MD;  Location: [facility] ORS;  Service: Urology;  Laterality: Right;   NASAL SINUS SURGERY  2009   DEVIATED SEPTUM AND POLYPS   permanent indwelling catheter     PROSTATE SURGERY     PROSTATECTOMY   TOTAL KNEE ARTHROPLASTY Left 9/15   Dr [name]   URETHRAL STRICTURE DILATATION  02-2010   Dr.Cope    Home Medications:  Allergies as of 01/04/2022       Reactions   Chlorhexidine    Ciprofloxacin Nausea And Vomiting   Headache   Citalopram Hydrobromide Other (See Comments)   "unknown"   Clindamycin Nausea And Vomiting   Lorazepam Other (See Comments)   Adverse reaction   Paroxetine Nausea Only   Ramipril Other (See Comments)   "unknown"   Simvastatin    Sulfa Antibiotics Other (See Comments)        Medication List        Accurate as of January 04, 2022  9:51 PM. If you have any questions, ask your nurse or doctor.          acetaminophen 500 MG tablet Commonly known as: TYLENOL Take 500 mg by mouth 3 (three) times daily as needed for mild pain or moderate pain.   albuterol 108 (90 Base) MCG/ACT inhaler Commonly known as: VENTOLIN HFA Inhale 2 puffs into the lungs every 6 (six) hours as needed for wheezing or shortness of breath.   Align 4 MG Caps Take 1 capsule (4 mg total) by mouth daily.   ALOE PO Take 400 mg by mouth daily.   ALPRAZolam 0.5 MG tablet Commonly known as: XANAX Take 1 tablet (0.5 mg total) by mouth at bedtime.   cetirizine 10 MG tablet Commonly known as: ZYRTEC Take 10 mg by mouth at bedtime.   chlorhexidine 0.12 % solution Commonly known as: PERIDEX Use as directed 15 mLs in the mouth or throat daily as needed.   Co Q 10 100 MG Caps Take 100 mg by mouth daily.   Cranberry 250 MG Chew Chew 250 mg by mouth daily.    EPINEPHrine 0.3 mg/0.3 mL Soaj injection Commonly known as: EPI-PEN Inject 0.3 mg into the muscle as needed for anaphylaxis.   fluconazole 100 MG tablet Commonly known as: DIFLUCAN Take 1 tablet (100 mg total) by mouth daily.   fluticasone 50 MCG/ACT nasal spray Commonly known as: FLONASE Place 2 sprays into both nostrils 2 (two) times daily.   folic acid 563 MCG tablet Commonly known as: FOLVITE Take 1,200 mcg by mouth daily.   furosemide 40 MG tablet Commonly known as: LASIX Take 1 tablet (40 mg total)  by mouth daily. Skip dose if SBP less than 120 mmHg   gabapentin 600 MG tablet Commonly known as: NEURONTIN Take 600 mg by mouth 2 (two) times daily at 8 am and 10 pm.   hydroxychloroquine 200 MG tablet Commonly known as: PLAQUENIL Take 200 mg by mouth 2 (two) times daily.   iron polysaccharides 150 MG capsule Commonly known as: NIFEREX Take 150 mg by mouth daily.   ketoconazole 2 % shampoo Commonly known as: NIZORAL Apply 1 application topically 2 (two) times a week.   ketoconazole 2 % cream Commonly known as: NIZORAL Apply 1 application topically 2 (two) times daily.   methotrexate 2.5 MG tablet Commonly known as: RHEUMATREX Take 25 mg by mouth every Wednesday.   metoprolol succinate 25 MG 24 hr tablet Commonly known as: TOPROL-XL Take 0.5 tablets (12.5 mg total) by mouth 2 (two) times daily. Skip dose if systolic BP less than 366 mmHg and/or heart rate less than 65   midodrine 2.5 MG tablet Commonly known as: PROAMATINE Take 1 tablet (2.5 mg total) by mouth 3 (three) times daily with meals.   milk thistle 175 MG tablet Take 175 mg by mouth daily.   multivitamin tablet Take 1 tablet by mouth daily.   Myrbetriq 50 MG Tb24 tablet Generic drug: mirabegron ER TAKE 1 TABLET(50 MG) BY MOUTH DAILY   nystatin cream Commonly known as: MYCOSTATIN Apply 1 application topically 2 (two) times daily.   Omega 3 1200 MG Caps Take 1 capsule by mouth daily.    omeprazole 20 MG capsule Commonly known as: PRILOSEC Take 1 capsule (20 mg total) by mouth daily as needed.   ondansetron 4 MG tablet Commonly known as: ZOFRAN Take 4 mg by mouth every 6 (six) hours as needed for nausea or vomiting.   polyethylene glycol powder 17 GM/SCOOP powder Commonly known as: GLYCOLAX/MIRALAX Take 17 g by mouth at bedtime.   pravastatin 20 MG tablet Commonly known as: PRAVACHOL TAKE 1 TABLET(20 MG) BY MOUTH AT BEDTIME   QUEtiapine 25 MG tablet Commonly known as: SEROQUEL Take 1 tablet (25 mg total) by mouth at bedtime.   Selenium 200 MCG Caps Take 200 mcg by mouth daily.   triamcinolone ointment 0.1 % Commonly known as: KENALOG Apply 1 application topically as needed (skin irritation).   Turmeric 500 MG Caps Take 500 mg by mouth daily.   vitamin B-12 1000 MCG tablet Commonly known as: CYANOCOBALAMIN Take 1,000 mcg by mouth daily.   vitamin C 1000 MG tablet Take 1,000 mg by mouth daily.   Vitamin D3 50 MCG (2000 UT) Tabs Take 2,000 Units by mouth daily.   Zinc 50 MG Tabs Take 1 tablet by mouth daily.        Allergies:  Allergies  Allergen Reactions   Chlorhexidine    Ciprofloxacin Nausea And Vomiting    Headache   Citalopram Hydrobromide Other (See Comments)    "unknown"   Clindamycin Nausea And Vomiting   Lorazepam Other (See Comments)    Adverse reaction   Paroxetine Nausea Only   Ramipril Other (See Comments)    "unknown"   Simvastatin    Sulfa Antibiotics Other (See Comments)    Family History: Family History  Problem Relation Age of Onset   Heart disease Mother 83       heart failure   Heart failure Mother    Pneumonia Father 46       pnemonia   Skin cancer Father    Skin cancer Sister  Skin cancer Son    Colon cancer Neg Hx    Esophageal cancer Neg Hx    Stomach cancer Neg Hx    Pancreatic cancer Neg Hx    Liver disease Neg Hx     Social History:  reports that he has never smoked. He has never used  smokeless tobacco. He reports current alcohol use. He reports that he does not use drugs.  ROS: Pertinent ROS in HPI  Physical Exam: Constitutional:  Well nourished. Alert and oriented, No acute distress. HEENT: [place] AT, mask in place.  Trachea midline Cardiovascular: No clubbing, cyanosis, or edema. Respiratory: Normal respiratory effort, no increased work of breathing. GU: No CVA tenderness.  No bladder fullness or masses.  Patient with uncircumcised phallus.  Foreskin easily retracted  Urethral meatus is patent.  No penile discharge. No penile lesions or rashes. Scrotum without lesions, cysts, rashes and/or edema.   Neurologic: Grossly intact, no focal deficits, moving all 4 extremities. Psychiatric: Normal mood and affect.  Laboratory Data: Lab Results  Component Value Date   WBC 9.4 12/23/2021   HGB 10.0 (L) 12/23/2021   HCT 30.8 (L) 12/23/2021   MCV 97 12/23/2021   PLT 167 12/23/2021    Lab Results  Component Value Date   CREATININE 1.65 (H) 12/23/2021    Lab Results  Component Value Date   PSA 0.00 (L) 04/27/2017   PSA 0.01 (L) 05/18/2016   PSA 0.01 (L) 01/26/2015   Lab Results  Component Value Date   AST 19 12/10/2021   Lab Results  Component Value Date   ALT 11 12/10/2021  I have reviewed the labs.   Pertinent Imaging: N/A   Assessment & Plan:    1. Chronic indwelling Foley catheter -Foley catheter could not be replaced at this time ? False passage -discussed with Dr. Erlene Quan, patient, his daughter and his PCP that we will not replace the catheter at this time-explained that he has a small capacity bladder and there is a low probability for urinary retention, he will however have more incidences of incontinence and the facility will need to be diligent regarding keep him out of wet depends and bedding and we will reassess in one week  2. Renal cell cancer, right [facility]) -has appointment with Dr. Erlene Quan on 01/19/2022 for follow up imaging - Basic metabolic  panel  3. UTI vs colonization -[facility] has sent urine for culture as he was complaining of suprapubic pain, but his pain may also be due to constipation   Return in about 1 week (around 01/11/2022) for PVR .  These notes generated with voice recognition software. I apologize for typographical errors.  Zara Council, PA-C  [facility] [address]  [place], [place] [address] [phone]   I spent 30 minutes on the day of the encounter to include pre-visit record review, face-to-face time with the patient, and post-visit ordering of tests.

## 2022-01-04 ENCOUNTER — Encounter: Payer: Self-pay | Admitting: Urology

## 2022-01-04 ENCOUNTER — Other Ambulatory Visit: Payer: Self-pay

## 2022-01-04 ENCOUNTER — Ambulatory Visit (INDEPENDENT_AMBULATORY_CARE_PROVIDER_SITE_OTHER): Payer: Medicare PPO | Admitting: Urology

## 2022-01-04 DIAGNOSIS — G4733 Obstructive sleep apnea (adult) (pediatric): Secondary | ICD-10-CM | POA: Diagnosis not present

## 2022-01-04 DIAGNOSIS — Z978 Presence of other specified devices: Secondary | ICD-10-CM | POA: Diagnosis not present

## 2022-01-04 DIAGNOSIS — C641 Malignant neoplasm of right kidney, except renal pelvis: Secondary | ICD-10-CM

## 2022-01-04 DIAGNOSIS — R339 Retention of urine, unspecified: Secondary | ICD-10-CM | POA: Diagnosis not present

## 2022-01-04 DIAGNOSIS — K5900 Constipation, unspecified: Secondary | ICD-10-CM | POA: Diagnosis not present

## 2022-01-04 NOTE — Patient Instructions (Signed)
[name] was found to have a false passage in his urethra and the catheter could not be replaced.  He has a small capacity bladder and will likely not go into urinary retention.  We are going to leave his catheter out for one week to allow things to heal.  He will be incontinent of urine and will need his depends changed on a regular basis to remain dry and prevent further issues with his bed sores.    I have notified his daughter, [name].

## 2022-01-05 ENCOUNTER — Telehealth: Payer: Self-pay | Admitting: Urology

## 2022-01-05 ENCOUNTER — Ambulatory Visit (INDEPENDENT_AMBULATORY_CARE_PROVIDER_SITE_OTHER): Payer: Medicare PPO

## 2022-01-05 VITALS — BP 105/67 | HR 78 | Temp 98.1°F | Resp 17 | Ht 69.0 in | Wt 193.0 lb

## 2022-01-05 DIAGNOSIS — R1030 Lower abdominal pain, unspecified: Secondary | ICD-10-CM | POA: Diagnosis not present

## 2022-01-05 DIAGNOSIS — J4551 Severe persistent asthma with (acute) exacerbation: Secondary | ICD-10-CM | POA: Diagnosis not present

## 2022-01-05 DIAGNOSIS — R339 Retention of urine, unspecified: Secondary | ICD-10-CM | POA: Diagnosis not present

## 2022-01-05 DIAGNOSIS — G4733 Obstructive sleep apnea (adult) (pediatric): Secondary | ICD-10-CM | POA: Diagnosis not present

## 2022-01-05 LAB — BASIC METABOLIC PANEL
BUN/Creatinine Ratio: 15 (ref 10–24)
BUN: 19 mg/dL (ref 8–27)
CO2: 25 mmol/L (ref 20–29)
Calcium: 9.1 mg/dL (ref 8.6–10.2)
Chloride: 97 mmol/L (ref 96–106)
Creatinine, Ser: 1.31 mg/dL — ABNORMAL HIGH (ref 0.76–1.27)
Glucose: 102 mg/dL — ABNORMAL HIGH (ref 70–99)
Potassium: 4.4 mmol/L (ref 3.5–5.2)
Sodium: 139 mmol/L (ref 134–144)
eGFR: 53 mL/min/{1.73_m2} — ABNORMAL LOW (ref >59)

## 2022-01-05 MED ORDER — SODIUM CHLORIDE 0.9 % IV SOLN: Freq: Once | INTRAVENOUS | Status: DC | PRN

## 2022-01-05 MED ORDER — EPINEPHRINE 0.3 MG/0.3ML IJ SOAJ: 0.3000 mg | Freq: Once | INTRAMUSCULAR | Status: DC | PRN

## 2022-01-05 MED ORDER — OMALIZUMAB 75 MG/0.5ML ~~LOC~~ SOSY
375.0000 mg | PREFILLED_SYRINGE | Freq: Once | SUBCUTANEOUS | Status: AC
  Administered 2022-01-05: 375 mg via SUBCUTANEOUS
  Filled 2022-01-05: qty 0.5

## 2022-01-05 MED ORDER — FAMOTIDINE IN NACL 20-0.9 MG/50ML-% IV SOLN: 20.0000 mg | Freq: Once | INTRAVENOUS | Status: DC | PRN

## 2022-01-05 MED ORDER — ALBUTEROL SULFATE HFA 108 (90 BASE) MCG/ACT IN AERS: 2.0000 | INHALATION_SPRAY | Freq: Once | RESPIRATORY_TRACT | Status: DC | PRN

## 2022-01-05 MED ORDER — DIPHENHYDRAMINE HCL 50 MG/ML IJ SOLN: 50.0000 mg | Freq: Once | INTRAMUSCULAR | Status: DC | PRN

## 2022-01-05 MED ORDER — METHYLPREDNISOLONE SODIUM SUCC 125 MG IJ SOLR: 125.0000 mg | Freq: Once | INTRAMUSCULAR | Status: DC | PRN

## 2022-01-05 NOTE — Telephone Encounter (Signed)
Pts daughter called in and said the pt has concerns about something that was printed on his AVS yesterday. It says on there his renal cell cancer was discussed and he expressed to his daughter that that was not discussed and didn't know why it was on there. She said now he's worried and she would like someone to call her to clarify the visit. Advised her it may be today or tomorrow before she hears from someone.

## 2022-01-05 NOTE — Progress Notes (Signed)
Diagnosis: Asthma  Provider:  Marshell Garfinkel, MD  Procedure: Injection  Xolair (Omalizumab), Dose: 375 mg, Site: subcutaneous, Number of injections: 3  Discharge: Condition: Good, Destination: Rehab . AVS provided to patient.   Performed by:  Koren Shiver, RN

## 2022-01-05 NOTE — Telephone Encounter (Signed)
Advised Patient daughter the lab yesterday was associated with a history of renal cell cancer. Nothing new .

## 2022-01-07 ENCOUNTER — Telehealth: Payer: Self-pay | Admitting: Pharmacy Technician

## 2022-01-07 DIAGNOSIS — K5900 Constipation, unspecified: Secondary | ICD-10-CM | POA: Diagnosis not present

## 2022-01-07 DIAGNOSIS — R339 Retention of urine, unspecified: Secondary | ICD-10-CM | POA: Diagnosis not present

## 2022-01-07 NOTE — Telephone Encounter (Signed)
Auth Submission: PENDING Payer: [employer] Medication & CPT/J Code(s) submitted: Xolair (Omalizumab) [id number] Route of submission (phone, fax, portal): COVER MY MEDS Auth type: Buy/Bill Units/visits requested: 375MG  Q14D Reference number: [id number]   Will update once we receive a response.

## 2022-01-10 DIAGNOSIS — R339 Retention of urine, unspecified: Secondary | ICD-10-CM | POA: Diagnosis not present

## 2022-01-10 DIAGNOSIS — I5030 Unspecified diastolic (congestive) heart failure: Secondary | ICD-10-CM | POA: Diagnosis not present

## 2022-01-10 DIAGNOSIS — G4700 Insomnia, unspecified: Secondary | ICD-10-CM | POA: Diagnosis not present

## 2022-01-10 NOTE — Telephone Encounter (Signed)
Auth Submission: APPROVED Payer: [employer] Medication & CPT/J Code(s) submitted: Xolair (Omalizumab) [id number] Route of submission (phone, fax, portal): [place] type: Buy/Bill Units/visits requested: 375MG  Q14D Reference number: [id number] EOC# [id number] Approval from: 11/21/21 to 11/27/22

## 2022-01-12 NOTE — Progress Notes (Unsigned)
01/13/2022 9:51 PM   [name] [birth year] [id number]  Referring provider: [name], MD [place],  [place] [address]  No chief complaint on file.  Urological history: 1. Prostate cancer -radical prostatectomy 1993 -salvage radiation 1998  2. Small capacity bladder -developed after salvage radiation 1998 -cysto 2019 -  Penile bulbar urethra normal.  Bladder neck was open.  He had grade 1 or 2 trabeculation  3. Renal cell carcinoma -Surgical pathology consistent with pT3aNx , Furhman grade 2 with invasion into the renal sinus. -right radical nephrectomy 2020   HPI: [name] is a 88 y.o. male who presents today for follow up after Foley catheter was not replaced after his last exchange.  At his visit on January 04, 2022, when the Foley exchange occurred it was difficult to ascertain whether Foley was in the correct spot so after long discussion we decided to leave the catheter out as he does not have true urinary retention and the catheter was in place to manage incontinence.  Today, ***  PMH: Past Medical History:  Diagnosis Date   (HFpEF) heart failure with preserved ejection fraction ([place])    a. 02/2019 Echo: Ef 60-65%, mildly reduced RV fxn. RVSP 69mmHg. Mildly dil LA. Mod dil RA. Mild to mod TR. Mild to mod AS. Triv AI.   Allergy    Anemia    Anxiety    Arthritis    rheumatoid   Asthma    Basal cell carcinoma    back   CAD (coronary artery disease)    CHF (congestive heart failure) (HCC)    Chronic kidney disease    Mass right kidney   Chronic sinusitis    Collagen vascular disease (HCC)    Rheumatoid Arthritis   Depression    Diverticulitis    Dysrhythmia    ED (erectile dysfunction)    GERD (gastroesophageal reflux disease)    History of SIADH    Hx of adenomatous colonic polyps    Hypertension    Hyponatremia    IBS (irritable bowel syndrome)    Neurodermatitis    Neurogenic bladder    OSA (obstructive sleep  apnea)    no CPAP since weight loss   Persistent atrial fibrillation ([place])    a. Dx 02/2019; b. CHA2DS2VASc = 6-->eliquis initiated.   Prostate cancer [facility])    Renal mass    10/2018 4.2cm R renal cortical mass. Most compatible w/ clear cell renal cell carcinoma.    Surgical History: Past Surgical History:  Procedure Laterality Date   CARDIOVERSION N/A 04/23/2019   Procedure: CARDIOVERSION;  Surgeon: Nelva Bush, MD;  Location: [facility] ORS;  Service: Cardiovascular;  Laterality: N/A;   KNEE ARTHROPLASTY Right 09/02/2015   Procedure: COMPUTER ASSISTED TOTAL KNEE ARTHROPLASTY;  Surgeon: [name], MD;  Location: [facility] ORS;  Service: Orthopedics;  Laterality: Right;   LAPAROSCOPIC NEPHRECTOMY, HAND ASSISTED Right 06/17/2019   Procedure: HAND ASSISTED LAPAROSCOPIC NEPHRECTOMY;  Surgeon: Hollice Espy, MD;  Location: [facility] ORS;  Service: Urology;  Laterality: Right;   NASAL SINUS SURGERY  2009   DEVIATED SEPTUM AND POLYPS   permanent indwelling catheter     PROSTATE SURGERY     PROSTATECTOMY   TOTAL KNEE ARTHROPLASTY Left 9/15   Dr [name]   URETHRAL STRICTURE DILATATION  02-2010   Dr.Cope    Home Medications:  Allergies as of 01/13/2022       Reactions   Chlorhexidine    Ciprofloxacin Nausea And  Vomiting   Headache   Citalopram Hydrobromide Other (See Comments)   "unknown"   Clindamycin Nausea And Vomiting   Lorazepam Other (See Comments)   Adverse reaction   Paroxetine Nausea Only   Ramipril Other (See Comments)   "unknown"   Simvastatin    Sulfa Antibiotics Other (See Comments)        Medication List        Accurate as of January 12, 2022  9:51 PM. If you have any questions, ask your nurse or doctor.          acetaminophen 500 MG tablet Commonly known as: TYLENOL Take 500 mg by mouth 3 (three) times daily as needed for mild pain or moderate pain.   albuterol 108 (90 Base) MCG/ACT inhaler Commonly known as: VENTOLIN HFA Inhale 2 puffs into the lungs  every 6 (six) hours as needed for wheezing or shortness of breath.   Align 4 MG Caps Take 1 capsule (4 mg total) by mouth daily.   ALOE PO Take 400 mg by mouth daily.   ALPRAZolam 0.5 MG tablet Commonly known as: XANAX Take 1 tablet (0.5 mg total) by mouth at bedtime.   cetirizine 10 MG tablet Commonly known as: ZYRTEC Take 10 mg by mouth at bedtime.   chlorhexidine 0.12 % solution Commonly known as: PERIDEX Use as directed 15 mLs in the mouth or throat daily as needed.   Co Q 10 100 MG Caps Take 100 mg by mouth daily.   Cranberry 250 MG Chew Chew 250 mg by mouth daily.   EPINEPHrine 0.3 mg/0.3 mL Soaj injection Commonly known as: EPI-PEN Inject 0.3 mg into the muscle as needed for anaphylaxis.   fluconazole 100 MG tablet Commonly known as: DIFLUCAN Take 1 tablet (100 mg total) by mouth daily.   fluticasone 50 MCG/ACT nasal spray Commonly known as: FLONASE Place 2 sprays into both nostrils 2 (two) times daily.   folic acid 638 MCG tablet Commonly known as: FOLVITE Take 1,200 mcg by mouth daily.   furosemide 40 MG tablet Commonly known as: LASIX Take 1 tablet (40 mg total) by mouth daily. Skip dose if SBP less than 120 mmHg   gabapentin 600 MG tablet Commonly known as: NEURONTIN Take 600 mg by mouth 2 (two) times daily at 8 am and 10 pm.   hydroxychloroquine 200 MG tablet Commonly known as: PLAQUENIL Take 200 mg by mouth 2 (two) times daily.   iron polysaccharides 150 MG capsule Commonly known as: NIFEREX Take 150 mg by mouth daily.   ketoconazole 2 % shampoo Commonly known as: NIZORAL Apply 1 application topically 2 (two) times a week.   ketoconazole 2 % cream Commonly known as: NIZORAL Apply 1 application topically 2 (two) times daily.   methotrexate 2.5 MG tablet Commonly known as: RHEUMATREX Take 25 mg by mouth every Wednesday.   metoprolol succinate 25 MG 24 hr tablet Commonly known as: TOPROL-XL Take 0.5 tablets (12.5 mg total) by mouth 2  (two) times daily. Skip dose if systolic BP less than 756 mmHg and/or heart rate less than 65   midodrine 2.5 MG tablet Commonly known as: PROAMATINE Take 1 tablet (2.5 mg total) by mouth 3 (three) times daily with meals.   milk thistle 175 MG tablet Take 175 mg by mouth daily.   multivitamin tablet Take 1 tablet by mouth daily.   Myrbetriq 50 MG Tb24 tablet Generic drug: mirabegron ER TAKE 1 TABLET(50 MG) BY MOUTH DAILY   nystatin cream Commonly known  as: MYCOSTATIN Apply 1 application topically 2 (two) times daily.   Omega 3 1200 MG Caps Take 1 capsule by mouth daily.   omeprazole 20 MG capsule Commonly known as: PRILOSEC Take 1 capsule (20 mg total) by mouth daily as needed.   ondansetron 4 MG tablet Commonly known as: ZOFRAN Take 4 mg by mouth every 6 (six) hours as needed for nausea or vomiting.   polyethylene glycol powder 17 GM/SCOOP powder Commonly known as: GLYCOLAX/MIRALAX Take 17 g by mouth at bedtime.   pravastatin 20 MG tablet Commonly known as: PRAVACHOL TAKE 1 TABLET(20 MG) BY MOUTH AT BEDTIME   QUEtiapine 25 MG tablet Commonly known as: SEROQUEL Take 1 tablet (25 mg total) by mouth at bedtime.   Selenium 200 MCG Caps Take 200 mcg by mouth daily.   triamcinolone ointment 0.1 % Commonly known as: KENALOG Apply 1 application topically as needed (skin irritation).   Turmeric 500 MG Caps Take 500 mg by mouth daily.   vitamin B-12 1000 MCG tablet Commonly known as: CYANOCOBALAMIN Take 1,000 mcg by mouth daily.   vitamin C 1000 MG tablet Take 1,000 mg by mouth daily.   Vitamin D3 50 MCG (2000 UT) Tabs Take 2,000 Units by mouth daily.   Zinc 50 MG Tabs Take 1 tablet by mouth daily.        Allergies:  Allergies  Allergen Reactions   Chlorhexidine    Ciprofloxacin Nausea And Vomiting    Headache   Citalopram Hydrobromide Other (See Comments)    "unknown"   Clindamycin Nausea And Vomiting   Lorazepam Other (See Comments)    Adverse  reaction   Paroxetine Nausea Only   Ramipril Other (See Comments)    "unknown"   Simvastatin    Sulfa Antibiotics Other (See Comments)    Family History: Family History  Problem Relation Age of Onset   Heart disease Mother 45       heart failure   Heart failure Mother    Pneumonia Father 55       pnemonia   Skin cancer Father    Skin cancer Sister    Skin cancer Son    Colon cancer Neg Hx    Esophageal cancer Neg Hx    Stomach cancer Neg Hx    Pancreatic cancer Neg Hx    Liver disease Neg Hx     Social History:  reports that he has never smoked. He has never used smokeless tobacco. He reports current alcohol use. He reports that he does not use drugs.  ROS: Pertinent ROS in HPI  Physical Exam: Constitutional:  Well nourished. Alert and oriented, No acute distress. HEENT: [place] AT, moist mucus membranes.  Trachea midline Cardiovascular: No clubbing, cyanosis, or edema. Respiratory: Normal respiratory effort, no increased work of breathing. GU: No CVA tenderness.  No bladder fullness or masses.  Patient with circumcised/uncircumcised phallus. ***Foreskin easily retracted***  Urethral meatus is patent.  No penile discharge. No penile lesions or rashes. Scrotum without lesions, cysts, rashes and/or edema.  Testicles are located scrotally bilaterally. No masses are appreciated in the testicles. Left and right epididymis are normal. Rectal: Patient with  normal sphincter tone. Anus and perineum without scarring or rashes. No rectal masses are appreciated. Prostate is approximately *** grams, *** nodules are appreciated. Seminal vesicles are normal. Neurologic: Grossly intact, no focal deficits, moving all 4 extremities. Psychiatric: Normal mood and affect.   Laboratory Data: BMP Latest Ref Rng & Units 01/04/2022 12/23/2021 12/16/2021  Glucose 70 -  99 mg/dL 102(H) 114(H) 109(H)  BUN 8 - 27 mg/dL 19 26 24(H)  Creatinine 0.76 - 1.27 mg/dL 1.31(H) 1.65(H) 1.33(H)  BUN/Creat Ratio 10 - 24  15 16  -  Sodium 134 - 144 mmol/L 139 142 135  Potassium 3.5 - 5.2 mmol/L 4.4 5.0 4.1  Chloride 96 - 106 mmol/L 97 102 95(L)  CO2 20 - 29 mmol/L 25 27 32  Calcium 8.6 - 10.2 mg/dL 9.1 8.9 8.4(L)  I have reviewed the labs.   Pertinent Imaging: ***  Assessment & Plan:    1. Small capacity bladder/Incontinence -***  2. Renal cell cancer, right ([place]) -has appointment with Dr. Erlene Quan on 01/19/2022 for follow up imaging - Basic metabolic panel  3. UTI vs colonization -[facility] has sent urine for culture as he was complaining of suprapubic pain, but his pain may also be due to constipation   No follow-ups on file.  These notes generated with voice recognition software. I apologize for typographical errors.  Zara Council, PA-C  [facility] [address]  [place], [place] [address] [phone]

## 2022-01-13 ENCOUNTER — Ambulatory Visit: Payer: Medicare PPO | Admitting: Urology

## 2022-01-13 ENCOUNTER — Ambulatory Visit (INDEPENDENT_AMBULATORY_CARE_PROVIDER_SITE_OTHER): Payer: Medicare PPO | Admitting: Urology

## 2022-01-13 ENCOUNTER — Encounter: Payer: Self-pay | Admitting: Urology

## 2022-01-13 ENCOUNTER — Other Ambulatory Visit: Payer: Self-pay

## 2022-01-13 VITALS — BP 111/63 | HR 71 | Ht 69.0 in | Wt 200.0 lb

## 2022-01-13 DIAGNOSIS — C641 Malignant neoplasm of right kidney, except renal pelvis: Secondary | ICD-10-CM

## 2022-01-13 DIAGNOSIS — N39498 Other specified urinary incontinence: Secondary | ICD-10-CM

## 2022-01-13 DIAGNOSIS — N319 Neuromuscular dysfunction of bladder, unspecified: Secondary | ICD-10-CM | POA: Diagnosis not present

## 2022-01-13 DIAGNOSIS — N390 Urinary tract infection, site not specified: Secondary | ICD-10-CM

## 2022-01-13 LAB — BLADDER SCAN AMB NON-IMAGING: Scan Result: 28

## 2022-01-13 MED ORDER — GEMTESA 75 MG PO TABS: 75.0000 mg | ORAL_TABLET | Freq: Every day | ORAL | 3 refills | Status: DC

## 2022-01-14 LAB — BASIC METABOLIC PANEL
BUN/Creatinine Ratio: 17 (ref 10–24)
BUN: 19 mg/dL (ref 8–27)
CO2: 25 mmol/L (ref 20–29)
Calcium: 8.9 mg/dL (ref 8.6–10.2)
Chloride: 96 mmol/L (ref 96–106)
Creatinine, Ser: 1.09 mg/dL (ref 0.76–1.27)
Glucose: 95 mg/dL (ref 70–99)
Potassium: 4.2 mmol/L (ref 3.5–5.2)
Sodium: 138 mmol/L (ref 134–144)
eGFR: 67 mL/min/{1.73_m2} (ref >59)

## 2022-01-18 DIAGNOSIS — M6281 Muscle weakness (generalized): Secondary | ICD-10-CM | POA: Diagnosis not present

## 2022-01-18 DIAGNOSIS — R339 Retention of urine, unspecified: Secondary | ICD-10-CM | POA: Diagnosis not present

## 2022-01-18 DIAGNOSIS — G4700 Insomnia, unspecified: Secondary | ICD-10-CM | POA: Diagnosis not present

## 2022-01-18 DIAGNOSIS — I5030 Unspecified diastolic (congestive) heart failure: Secondary | ICD-10-CM | POA: Diagnosis not present

## 2022-01-19 ENCOUNTER — Ambulatory Visit
Admission: RE | Admit: 2022-01-19 | Discharge: 2022-01-19 | Disposition: A | Discharge status: Home / Self Care | Payer: Medicare PPO | Source: Home / Self Care | Attending: Urology | Admitting: Urology

## 2022-01-19 ENCOUNTER — Other Ambulatory Visit: Payer: Self-pay

## 2022-01-19 ENCOUNTER — Ambulatory Visit: Payer: Self-pay | Admitting: Urology

## 2022-01-19 ENCOUNTER — Ambulatory Visit
Admission: RE | Admit: 2022-01-19 | Discharge: 2022-01-19 | Disposition: A | Discharge status: Home / Self Care | Payer: Medicare PPO | Source: Ambulatory Visit | Attending: Urology | Admitting: Urology

## 2022-01-19 DIAGNOSIS — J45909 Unspecified asthma, uncomplicated: Secondary | ICD-10-CM | POA: Diagnosis not present

## 2022-01-19 DIAGNOSIS — N289 Disorder of kidney and ureter, unspecified: Secondary | ICD-10-CM | POA: Diagnosis not present

## 2022-01-19 DIAGNOSIS — J90 Pleural effusion, not elsewhere classified: Secondary | ICD-10-CM | POA: Diagnosis not present

## 2022-01-19 DIAGNOSIS — C641 Malignant neoplasm of right kidney, except renal pelvis: Secondary | ICD-10-CM

## 2022-01-19 DIAGNOSIS — K5900 Constipation, unspecified: Secondary | ICD-10-CM | POA: Diagnosis not present

## 2022-01-19 DIAGNOSIS — K5730 Diverticulosis of large intestine without perforation or abscess without bleeding: Secondary | ICD-10-CM | POA: Diagnosis not present

## 2022-01-19 DIAGNOSIS — I509 Heart failure, unspecified: Secondary | ICD-10-CM | POA: Diagnosis not present

## 2022-01-19 DIAGNOSIS — I2510 Atherosclerotic heart disease of native coronary artery without angina pectoris: Secondary | ICD-10-CM | POA: Diagnosis not present

## 2022-01-19 MED ORDER — IOHEXOL 300 MG/ML  SOLN
100.0000 mL | Freq: Once | INTRAMUSCULAR | Status: AC | PRN
  Administered 2022-01-19: 100 mL via INTRAVENOUS

## 2022-01-20 ENCOUNTER — Ambulatory Visit (INDEPENDENT_AMBULATORY_CARE_PROVIDER_SITE_OTHER): Payer: Medicare PPO

## 2022-01-20 VITALS — BP 105/59 | HR 76 | Temp 97.5°F | Resp 18 | Ht 69.0 in

## 2022-01-20 DIAGNOSIS — J4551 Severe persistent asthma with (acute) exacerbation: Secondary | ICD-10-CM

## 2022-01-20 DIAGNOSIS — K5900 Constipation, unspecified: Secondary | ICD-10-CM | POA: Diagnosis not present

## 2022-01-20 DIAGNOSIS — Z201 Contact with and (suspected) exposure to tuberculosis: Secondary | ICD-10-CM | POA: Diagnosis not present

## 2022-01-20 MED ORDER — OMALIZUMAB 75 MG/0.5ML ~~LOC~~ SOSY
375.0000 mg | PREFILLED_SYRINGE | Freq: Once | SUBCUTANEOUS | Status: AC
  Administered 2022-01-20: 375 mg via SUBCUTANEOUS

## 2022-01-20 NOTE — Progress Notes (Signed)
Diagnosis: Asthma  Provider:  Marshell Garfinkel, MD  Procedure: Injection  Xolair (Omalizumab), Dose: 375 mg, Site: subcutaneous, Number of injections: 3- abdomen  Discharge: Condition: Good, Destination: Home . AVS provided to patient.   Performed by:  Paul Dykes, RN

## 2022-01-24 ENCOUNTER — Other Ambulatory Visit: Payer: Self-pay

## 2022-01-24 ENCOUNTER — Encounter: Payer: Self-pay | Admitting: Medical

## 2022-01-24 ENCOUNTER — Ambulatory Visit: Payer: Medicare PPO | Admitting: Medical

## 2022-01-24 VITALS — BP 100/48 | HR 74 | Ht 69.0 in | Wt 195.0 lb

## 2022-01-24 DIAGNOSIS — I359 Nonrheumatic aortic valve disorder, unspecified: Secondary | ICD-10-CM | POA: Diagnosis not present

## 2022-01-24 DIAGNOSIS — I5032 Chronic diastolic (congestive) heart failure: Secondary | ICD-10-CM

## 2022-01-24 DIAGNOSIS — R531 Weakness: Secondary | ICD-10-CM | POA: Diagnosis not present

## 2022-01-24 DIAGNOSIS — I959 Hypotension, unspecified: Secondary | ICD-10-CM | POA: Diagnosis not present

## 2022-01-24 DIAGNOSIS — I4819 Other persistent atrial fibrillation: Secondary | ICD-10-CM

## 2022-01-24 MED ORDER — MIDODRINE HCL 5 MG PO TABS: 5.0000 mg | ORAL_TABLET | Freq: Three times a day (TID) | ORAL | 3 refills | Status: DC

## 2022-01-24 MED ORDER — FUROSEMIDE 40 MG PO TABS: 40.0000 mg | ORAL_TABLET | Freq: Every day | ORAL | Status: AC

## 2022-01-24 NOTE — Patient Instructions (Signed)
Medication Instructions:  Your physician has recommended you make the following change in your medication:   INCREASE midodrine (Proamatine) to 5 mg 3 times a day   *If you need a refill on your cardiac medications before your next appointment, please call your pharmacy*   Lab Work: None ordered  If you have labs (blood work) drawn today and your tests are completely normal, you will receive your results only by: [place] (if you have MyChart) OR A paper copy in the mail If you have any lab test that is abnormal or we need to change your treatment, we will call you to review the results.   Testing/Procedures: None ordered   Follow-Up: At [facility], you and your health needs are our priority.  As part of our continuing mission to provide you with exceptional heart care, we have created designated Provider Care Teams.  These Care Teams include your primary Cardiologist (physician) and Advanced Practice Providers (APPs -  Physician Assistants and Nurse Practitioners) who all work together to provide you with the care you need, when you need it.  We recommend signing up for the patient portal called "MyChart".  Sign up information is provided on this After Visit Summary.  MyChart is used to connect with patients for Virtual Visits (Telemedicine).  Patients are able to view lab/test results, encounter notes, upcoming appointments, etc.  Non-urgent messages can be sent to your provider as well.   To learn more about what you can do with MyChart, go to [email or web address].    Your next appointment:   1 month(s)  The format for your next appointment:   In Person  Provider:   You may see Kathlyn Sacramento, MD or one of the following Advanced Practice Providers on your designated Care Team:   Murray Hodgkins, NP Christell Faith, PA-C Cadence Kathlen Mody, [place]   Other Instructions N/A

## 2022-01-24 NOTE — Progress Notes (Signed)
Cardiology Office Note:    Date:  01/25/2022   ID:  [name], DOB [birth year], MRN [id number]  PCP:  [name], MD  [facility] Cardiologist:  Kathlyn Sacramento, MD  [facility] Electrophysiologist:  None   Referring MD: [name], MD   Chief Complaint: 1 month follow-up  History of Present Illness:    [name] is a 88 y.o. male with a hx of with a hx of with history of HFpEF, atrial fibrillation on Eliquis, mod aortic stenosis, hyperlipidemia, prostate cancer s/p prostatectomy, s/p Rt renal nephrectomy, chronic indwelling Foley catheter who is being seen for 1 month follow-up.    Patient had chronic decline over the last few months. He was admitted January 2023 with UTI.  Discharged to short-term rehab and subsequently to home with physical therapy.  He states doing relatively well about a month ago. After rehab, he noticed worsening weakness unable to stand, fatigue.  His home physical therapy is advised him to come to the emergency room.In the ED, he was diagnosed with a UTI, chest x-ray showed pulmonary edema, possible pneumonia.  He was started on IV Lasix and antibiotics.  Hemoglobin this morning was 7.4, 9.2 . Eliquis was discontinued due to concern for fall and drop in Hgb.  EKG shows sinus rhythm, bifascicular block. He had low BP and was started on midodrine. Sent to a rehab facility on lasix 40mg  daily.   Last seen 12/13/21 for follow-up, frustrated with care at [facility]. Midodrine was decreased to 2.5mg  TID. Off Eliquis for anemia.    Today, the patient reports he has been doing well. He is leaving the SNF in 3 days. He had imaging last week that showed small b/l pleural effusions.  He reports he cut his salt intake. Says has minor swelling. No chest pain. He has a little shortness of breath, some minor orthopnea. Occurred 3-4 times. He is unsure if he is getting lasix daily, especially since there is BP limits and BP has been soft at times. No chest pain.    Past Medical History:  Diagnosis Date   (HFpEF) heart failure with preserved ejection fraction ([place])    a. 02/2019 Echo: Ef 60-65%, mildly reduced RV fxn. RVSP 42mmHg. Mildly dil LA. Mod dil RA. Mild to mod TR. Mild to mod AS. Triv AI.   Allergy    Anemia    Anxiety    Arthritis    rheumatoid   Asthma    Basal cell carcinoma    back   CAD (coronary artery disease)    CHF (congestive heart failure) (HCC)    Chronic kidney disease    Mass right kidney   Chronic sinusitis    Collagen vascular disease (HCC)    Rheumatoid Arthritis   Depression    Diverticulitis    Dysrhythmia    ED (erectile dysfunction)    GERD (gastroesophageal reflux disease)    History of SIADH    Hx of adenomatous colonic polyps    Hypertension    Hyponatremia    IBS (irritable bowel syndrome)    Neurodermatitis    Neurogenic bladder    OSA (obstructive sleep apnea)    no CPAP since weight loss   Persistent atrial fibrillation ([place])    a. Dx 02/2019; b. CHA2DS2VASc = 6-->eliquis initiated.   Prostate cancer [facility])    Renal mass    10/2018 4.2cm R renal cortical mass. Most compatible w/ clear cell renal cell carcinoma.    Past  Surgical History:  Procedure Laterality Date   CARDIOVERSION N/A 04/23/2019   Procedure: CARDIOVERSION;  Surgeon: Nelva Bush, MD;  Location: [facility] ORS;  Service: Cardiovascular;  Laterality: N/A;   KNEE ARTHROPLASTY Right 09/02/2015   Procedure: COMPUTER ASSISTED TOTAL KNEE ARTHROPLASTY;  Surgeon: [name], MD;  Location: [facility] ORS;  Service: Orthopedics;  Laterality: Right;   LAPAROSCOPIC NEPHRECTOMY, HAND ASSISTED Right 06/17/2019   Procedure: HAND ASSISTED LAPAROSCOPIC NEPHRECTOMY;  Surgeon: Hollice Espy, MD;  Location: [facility] ORS;  Service: Urology;  Laterality: Right;   NASAL SINUS SURGERY  2009   DEVIATED SEPTUM AND POLYPS   permanent indwelling catheter     PROSTATE SURGERY     PROSTATECTOMY   TOTAL KNEE ARTHROPLASTY Left 9/15   Dr [name]   URETHRAL  STRICTURE DILATATION  02-2010   Dr.Cope    Current Medications: Current Meds  Medication Sig   acetaminophen (TYLENOL) 500 MG tablet Take 500 mg by mouth 3 (three) times daily as needed for mild pain or moderate pain.   albuterol (VENTOLIN HFA) 108 (90 Base) MCG/ACT inhaler Inhale 2 puffs into the lungs every 6 (six) hours as needed for wheezing or shortness of breath.   ALOE PO Take 400 mg by mouth daily.   ALPRAZolam (XANAX) 0.5 MG tablet Take 1 tablet (0.5 mg total) by mouth at bedtime.   Ascorbic Acid (VITAMIN C) 1000 MG tablet Take 1,000 mg by mouth daily.   cetirizine (ZYRTEC) 5 MG tablet Take 5 mg by mouth daily.   chlorhexidine (PERIDEX) 0.12 % solution Use as directed 15 mLs in the mouth or throat daily as needed.   Cholecalciferol (VITAMIN D3) 50 MCG (2000 UT) TABS Take 2,000 Units by mouth daily.    clotrimazole-betamethasone (LOTRISONE) cream Apply 1 application topically 2 (two) times daily.   Coenzyme Q10 (CO Q 10) 100 MG CAPS Take 100 mg by mouth daily.    Cranberry 250 MG CHEW Chew 250 mg by mouth daily.   EPINEPHrine 0.3 mg/0.3 mL IJ SOAJ injection Inject 0.3 mg into the muscle as needed for anaphylaxis.   fluconazole (DIFLUCAN) 100 MG tablet Take 1 tablet (100 mg total) by mouth daily.   fluticasone (FLONASE) 50 MCG/ACT nasal spray Place 2 sprays into both nostrils 2 (two) times daily.   folic acid (FOLVITE) 165 MCG tablet Take 1,200 mcg by mouth daily.   gabapentin (NEURONTIN) 600 MG tablet Take 600 mg by mouth 2 (two) times daily at 8 am and 10 pm.   hydroxychloroquine (PLAQUENIL) 200 MG tablet Take 200 mg by mouth 2 (two) times daily.    hydrOXYzine (VISTARIL) 25 MG capsule Take 25 mg by mouth at bedtime as needed.   iron polysaccharides (NIFEREX) 150 MG capsule Take 150 mg by mouth daily.   ketoconazole (NIZORAL) 2 % cream Apply 1 application topically 2 (two) times daily.   ketoconazole (NIZORAL) 2 % shampoo Apply 1 application topically 2 (two) times a week.    melatonin 3 MG TABS tablet Take 6 mg by mouth at bedtime.   methotrexate (RHEUMATREX) 2.5 MG tablet Take 25 mg by mouth every Wednesday.   metoprolol succinate (TOPROL-XL) 25 MG 24 hr tablet Take 0.5 tablets (12.5 mg total) by mouth 2 (two) times daily. Skip dose if systolic BP less than 537 mmHg and/or heart rate less than 65   metoprolol succinate (TOPROL-XL) 25 MG 24 hr tablet Take 25 mg by mouth daily.   midodrine (PROAMATINE) 5 MG tablet Take 1 tablet (5 mg total) by  mouth 3 (three) times daily with meals. Skip dose if SBP greater than 110 and or HR greater than 100   Multiple Vitamin (MULTIVITAMIN) tablet Take 1 tablet by mouth daily.   nystatin cream (MYCOSTATIN) Apply 1 application topically 2 (two) times daily.   Omega 3 1200 MG CAPS Take 1 capsule by mouth daily.   omeprazole (PRILOSEC) 20 MG capsule Take 1 capsule (20 mg total) by mouth daily as needed.   ondansetron (ZOFRAN) 4 MG tablet Take 4 mg by mouth every 6 (six) hours as needed for nausea or vomiting.   oxyCODONE-acetaminophen (PERCOCET/ROXICET) 5-325 MG tablet Take 1 tablet by mouth every 6 (six) hours as needed for severe pain.   polyethylene glycol powder (GLYCOLAX/MIRALAX) 17 GM/SCOOP powder Take 17 g by mouth at bedtime.   pravastatin (PRAVACHOL) 20 MG tablet TAKE 1 TABLET(20 MG) BY MOUTH AT BEDTIME   Probiotic Product (ALIGN) 4 MG CAPS Take 1 capsule (4 mg total) by mouth daily.   QUEtiapine (SEROQUEL) 25 MG tablet Take 12.5 mg by mouth at bedtime.   Selenium 200 MCG CAPS Take 200 mcg by mouth daily.   Turmeric 500 MG CAPS Take 500 mg by mouth daily.    Vibegron (GEMTESA) 75 MG TABS Take 75 mg by mouth daily.   vitamin B-12 (CYANOCOBALAMIN) 1000 MCG tablet Take 1,000 mcg by mouth daily.   Zinc 50 MG TABS Take 1 tablet by mouth daily.   [DISCONTINUED] furosemide (LASIX) 40 MG tablet Take 1 tablet (40 mg total) by mouth daily. Skip dose if SBP less than 120 mmHg   [DISCONTINUED] midodrine (PROAMATINE) 5 MG tablet Take 2.5  mg by mouth 3 (three) times daily with meals. Skip dose if SBP greater than 110 and or HR greater than 100     Allergies:   Chlorhexidine, Ciprofloxacin, Citalopram hydrobromide, Clindamycin, Lorazepam, Paroxetine, Ramipril, Simvastatin, and Sulfa antibiotics   Social History   Socioeconomic History   Marital status: Married    Spouse name: Not on file   Number of children: 2   Years of education: Not on file   Highest education level: Not on file  Occupational History   Occupation: Radio Licensed conveyancer    Comment: retired  Tobacco Use   Smoking status: Never   Smokeless tobacco: Never  Scientific laboratory technician Use: Never used  Substance and Sexual Activity   Alcohol use: Not Currently    Comment: 7 drinks scotch weekly (1 per night)   Drug use: No   Sexual activity: Not Currently  Other Topics Concern   Not on file  Social History Narrative   Not sure about a living will or health care POA   Wife should make health care decisions for him--then daughter, then son   Would accept resuscitation attempts   Not sure about tube feeds   Social Determinants of Health   Financial Resource Strain: Not on file  Food Insecurity: Not on file  Transportation Needs: Not on file  Physical Activity: Not on file  Stress: Not on file  Social Connections: Not on file     Family History: The patient's family history includes Heart disease (age of onset: 81) in his mother; Heart failure in his mother; Pneumonia (age of onset: 43) in his father; Skin cancer in his father, sister, and son. There is no history of Colon cancer, Esophageal cancer, Stomach cancer, Pancreatic cancer, or Liver disease.  ROS:   Please see the history of present illness.  All other systems reviewed and are negative.  EKGs/Labs/Other Studies Reviewed:    The following studies were reviewed today:  Echo 12/12/21  1. Left ventricular ejection fraction, by estimation, is 60 to 65%. The  left ventricle has normal  function. The left ventricle has no regional  wall motion abnormalities. There is mild left ventricular hypertrophy.  Left ventricular diastolic parameters  are consistent with Grade I diastolic dysfunction (impaired relaxation).   2. Right ventricular systolic function is low normal. The right  ventricular size is normal. There is normal pulmonary artery systolic  pressure.   3. The mitral valve is grossly normal. Mild mitral valve regurgitation.   4. The aortic valve is calcified. Aortic valve regurgitation is trivial.  Moderate aortic valve stenosis. Aortic valve area, by VTI measures 1.32  cm. Aortic valve mean gradient measures 23.0 mmHg. Aortic valve Vmax  measures 3.12 m/s.   5. Aortic dilatation noted. There is borderline dilatation of the aortic  root, measuring 38 mm.   EKG:  EKG is  ordered today.  The ekg ordered today demonstrates NSR, LAD, RBBB, LAD, nonspecific T wave changes  Recent Labs: 12/10/2021: ALT 11 12/16/2021: Magnesium 2.2 12/23/2021: BNP 107.8; Hemoglobin 10.0; Platelets 167 01/13/2022: BUN 19; Creatinine, Ser 1.09; Potassium 4.2; Sodium 138  Recent Lipid Panel    Component Value Date/Time   CHOL 115 05/11/2020 1648   TRIG 60.0 05/11/2020 1648   HDL 56.50 05/11/2020 1648   CHOLHDL 2 05/11/2020 1648   VLDL 12.0 05/11/2020 1648   LDLCALC 47 05/11/2020 1648    Physical Exam:    VS:  BP (!) 100/48 (BP Location: Left Arm, Patient Position: Sitting, Cuff Size: Large)    Pulse 74    Ht 5\' 9"  (1.753 m)    Wt 195 lb (88.5 kg)    SpO2 95%    BMI 28.80 kg/m     Wt Readings from Last 3 Encounters:  01/24/22 195 lb (88.5 kg)  01/13/22 200 lb (90.7 kg)  01/05/22 193 lb (87.5 kg)     GEN:  Well nourished, well developed in no acute distress HEENT: Normal NECK: No JVD; No carotid bruits LYMPHATICS: No lymphadenopathy CARDIAC: RRR, no murmurs, rubs, gallops RESPIRATORY:  diminished at bases ABDOMEN: Soft, non-tender, non-distended MUSCULOSKELETAL:  1+ lower  leg edema; No deformity  SKIN: Warm and dry NEUROLOGIC:  Alert and oriented x 3 PSYCHIATRIC:  Normal affect   ASSESSMENT:    1. Chronic diastolic (congestive) heart failure (HCC)   2. Persistent atrial fibrillation (HCC)   3. Weakness   4. Aortic valve disorder   5. Hypotension, unspecified hypotension type    PLAN:    In order of problems listed above:  HFpEF with mild volume overload Patient has minimal LLE and CXR last week showed small b/l pleural effusions. Unsure if he has been getting lasix every day at the SNF, since instructions to hold lasix if SBP<120 and intermittent soft pressures. We will increase midodrine to 5mg  TID and instructions for lasix 40mg  daily, hold if SBP<110.   Persistent Afib EKS shows SR today. Eliquis was previously held for anemia. Most recent Hgb was 10 on 12/23/21. CHADSVASC of at least 5 (CHF, agex2, TIAx2) . Will discuss with MD re-initiation of Eliquis.   Hypotension Need to go back up to midodrine 5mg  TID for borderline HTN and need for lasix.   Weakness/physcial deconditioning This has been improving. He is getting out of SNF in 3 days.   Moderate AS Mild  AI seen on echo in 2021. Echo 12/12/21  showed moderate AI with a mean gradient of 28mmHg with borderline dilation of the aortic root 65mm. Will need to follow with serial echocardiograms.   Disposition: Follow up in 1 month(s) with MD/APP      Signed, Heddy Vidana Ninfa Meeker, PA-C  01/25/2022 10:44 AM    [place]

## 2022-01-25 DIAGNOSIS — R339 Retention of urine, unspecified: Secondary | ICD-10-CM | POA: Diagnosis not present

## 2022-01-25 DIAGNOSIS — G4700 Insomnia, unspecified: Secondary | ICD-10-CM | POA: Diagnosis not present

## 2022-01-25 DIAGNOSIS — M6281 Muscle weakness (generalized): Secondary | ICD-10-CM | POA: Diagnosis not present

## 2022-01-25 DIAGNOSIS — I5030 Unspecified diastolic (congestive) heart failure: Secondary | ICD-10-CM | POA: Diagnosis not present

## 2022-01-26 NOTE — Progress Notes (Signed)
01/13/2022 12:54 PM   [name] 1934/05/27 [id number]  Referring provider: [name], MD [address] [place],  [place] [address]  Chief Complaint  Patient presents with   Follow-up   Urological history: 1. Prostate cancer -radical prostatectomy 1993 -salvage radiation 1998  2. Small capacity bladder -developed after salvage radiation 1998 -cysto 2019 -  Penile bulbar urethra normal.  Bladder neck was open.  He had grade 1 or 2 trabeculation  3. Renal cell carcinoma -Surgical pathology consistent with pT3aNx , Furhman grade 2 with invasion into the renal sinus. -right radical nephrectomy 2020   HPI: [name] is a 88 y.o. male who presents today for follow up after Foley catheter was not replaced after his last exchange with his daughter, [name].    At his visit on January 04, 2022, when the Foley exchange occurred it was difficult to ascertain whether Foley was in the correct spot so after long discussion we decided to leave the catheter out as he does not have true urinary retention and the catheter was in place to manage incontinence.  Today, his PVR is 28 mL.  He is spontaneously voiding, but he is incontinent.  He is also having issues with his bowel movements and having episodes of stool incontinence.    His concern is that the wound on his bottom won't heal because he is sitting in wet depends.    PMH: Past Medical History:  Diagnosis Date   (HFpEF) heart failure with preserved ejection fraction ([place])    a. 02/2019 Echo: Ef 60-65%, mildly reduced RV fxn. RVSP 66mmHg. Mildly dil LA. Mod dil RA. Mild to mod TR. Mild to mod AS. Triv AI.   Allergy    Anemia    Anxiety    Arthritis    rheumatoid   Asthma    Basal cell carcinoma    back   CAD (coronary artery disease)    CHF (congestive heart failure) (HCC)    Chronic kidney disease    Mass right kidney   Chronic sinusitis    Collagen vascular disease (HCC)    Rheumatoid Arthritis    Depression    Diverticulitis    Dysrhythmia    ED (erectile dysfunction)    GERD (gastroesophageal reflux disease)    History of SIADH    Hx of adenomatous colonic polyps    Hypertension    Hyponatremia    IBS (irritable bowel syndrome)    Neurodermatitis    Neurogenic bladder    OSA (obstructive sleep apnea)    no CPAP since weight loss   Persistent atrial fibrillation ([place])    a. Dx 02/2019; b. CHA2DS2VASc = 6-->eliquis initiated.   Prostate cancer [facility])    Renal mass    10/2018 4.2cm R renal cortical mass. Most compatible w/ clear cell renal cell carcinoma.    Surgical History: Past Surgical History:  Procedure Laterality Date   CARDIOVERSION N/A 04/23/2019   Procedure: CARDIOVERSION;  Surgeon: Nelva Bush, MD;  Location: [facility] ORS;  Service: Cardiovascular;  Laterality: N/A;   KNEE ARTHROPLASTY Right 09/02/2015   Procedure: COMPUTER ASSISTED TOTAL KNEE ARTHROPLASTY;  Surgeon: [name], MD;  Location: [facility] ORS;  Service: Orthopedics;  Laterality: Right;   LAPAROSCOPIC NEPHRECTOMY, HAND ASSISTED Right 06/17/2019   Procedure: HAND ASSISTED LAPAROSCOPIC NEPHRECTOMY;  Surgeon: Hollice Espy, MD;  Location: [facility] ORS;  Service: Urology;  Laterality: Right;   NASAL SINUS SURGERY  2009   DEVIATED SEPTUM AND POLYPS  permanent indwelling catheter     PROSTATE SURGERY     PROSTATECTOMY   TOTAL KNEE ARTHROPLASTY Left 9/15   Dr [name]   URETHRAL STRICTURE DILATATION  02-2010   Dr.Cope    Home Medications:  Allergies as of 01/13/2022       Reactions   Chlorhexidine    Ciprofloxacin Nausea And Vomiting   Headache   Citalopram Hydrobromide Other (See Comments)   "unknown"   Clindamycin Nausea And Vomiting   Lorazepam Other (See Comments)   Adverse reaction   Paroxetine Nausea Only   Ramipril Other (See Comments)   "unknown"   Simvastatin    Sulfa Antibiotics Other (See Comments)        Medication List        Accurate as of January 13, 2022 11:59 PM. If you  have any questions, ask your nurse or doctor.          STOP taking these medications    Myrbetriq 50 MG Tb24 tablet Generic drug: mirabegron ER Stopped by: Zara Council, PA-C       TAKE these medications    acetaminophen 500 MG tablet Commonly known as: TYLENOL Take 500 mg by mouth 3 (three) times daily as needed for mild pain or moderate pain.   albuterol 108 (90 Base) MCG/ACT inhaler Commonly known as: VENTOLIN HFA Inhale 2 puffs into the lungs every 6 (six) hours as needed for wheezing or shortness of breath.   Align 4 MG Caps Take 1 capsule (4 mg total) by mouth daily.   ALOE PO Take 400 mg by mouth daily.   ALPRAZolam 0.5 MG tablet Commonly known as: XANAX Take 1 tablet (0.5 mg total) by mouth at bedtime.   cetirizine 10 MG tablet Commonly known as: ZYRTEC Take 10 mg by mouth at bedtime.   chlorhexidine 0.12 % solution Commonly known as: PERIDEX Use as directed 15 mLs in the mouth or throat daily as needed.   Co Q 10 100 MG Caps Take 100 mg by mouth daily.   Cranberry 250 MG Chew Chew 250 mg by mouth daily.   EPINEPHrine 0.3 mg/0.3 mL Soaj injection Commonly known as: EPI-PEN Inject 0.3 mg into the muscle as needed for anaphylaxis.   fluconazole 100 MG tablet Commonly known as: DIFLUCAN Take 1 tablet (100 mg total) by mouth daily.   fluticasone 50 MCG/ACT nasal spray Commonly known as: FLONASE Place 2 sprays into both nostrils 2 (two) times daily.   folic acid 751 MCG tablet Commonly known as: FOLVITE Take 1,200 mcg by mouth daily.   furosemide 40 MG tablet Commonly known as: LASIX Take 1 tablet (40 mg total) by mouth daily. Skip dose if SBP less than 120 mmHg   gabapentin 600 MG tablet Commonly known as: NEURONTIN Take 600 mg by mouth 2 (two) times daily at 8 am and 10 pm.   Gemtesa 75 MG Tabs Generic drug: Vibegron Take 75 mg by mouth daily. Started by: Zara Council, PA-C   hydroxychloroquine 200 MG tablet Commonly known as:  PLAQUENIL Take 200 mg by mouth 2 (two) times daily.   iron polysaccharides 150 MG capsule Commonly known as: NIFEREX Take 150 mg by mouth daily.   ketoconazole 2 % shampoo Commonly known as: NIZORAL Apply 1 application topically 2 (two) times a week.   ketoconazole 2 % cream Commonly known as: NIZORAL Apply 1 application topically 2 (two) times daily.   methotrexate 2.5 MG tablet Commonly known as: RHEUMATREX Take 25 mg by mouth  every Wednesday.   metoprolol succinate 25 MG 24 hr tablet Commonly known as: TOPROL-XL Take 0.5 tablets (12.5 mg total) by mouth 2 (two) times daily. Skip dose if systolic BP less than 585 mmHg and/or heart rate less than 65   midodrine 2.5 MG tablet Commonly known as: PROAMATINE Take 1 tablet (2.5 mg total) by mouth 3 (three) times daily with meals.   milk thistle 175 MG tablet Take 175 mg by mouth daily.   multivitamin tablet Take 1 tablet by mouth daily.   nystatin cream Commonly known as: MYCOSTATIN Apply 1 application topically 2 (two) times daily.   Omega 3 1200 MG Caps Take 1 capsule by mouth daily.   omeprazole 20 MG capsule Commonly known as: PRILOSEC Take 1 capsule (20 mg total) by mouth daily as needed.   ondansetron 4 MG tablet Commonly known as: ZOFRAN Take 4 mg by mouth every 6 (six) hours as needed for nausea or vomiting.   polyethylene glycol powder 17 GM/SCOOP powder Commonly known as: GLYCOLAX/MIRALAX Take 17 g by mouth at bedtime.   pravastatin 20 MG tablet Commonly known as: PRAVACHOL TAKE 1 TABLET(20 MG) BY MOUTH AT BEDTIME   QUEtiapine 25 MG tablet Commonly known as: SEROQUEL Take 1 tablet (25 mg total) by mouth at bedtime.   Selenium 200 MCG Caps Take 200 mcg by mouth daily.   triamcinolone ointment 0.1 % Commonly known as: KENALOG Apply 1 application topically as needed (skin irritation).   Turmeric 500 MG Caps Take 500 mg by mouth daily.   vitamin B-12 1000 MCG tablet Commonly known as:  CYANOCOBALAMIN Take 1,000 mcg by mouth daily.   vitamin C 1000 MG tablet Take 1,000 mg by mouth daily.   Vitamin D3 50 MCG (2000 UT) Tabs Take 2,000 Units by mouth daily.   Zinc 50 MG Tabs Take 1 tablet by mouth daily.        Allergies:  Allergies  Allergen Reactions   Chlorhexidine    Ciprofloxacin Nausea And Vomiting    Headache   Citalopram Hydrobromide Other (See Comments)    "unknown"   Clindamycin Nausea And Vomiting   Lorazepam Other (See Comments)    Adverse reaction   Paroxetine Nausea Only   Ramipril Other (See Comments)    "unknown"   Simvastatin    Sulfa Antibiotics Other (See Comments)    Family History: Family History  Problem Relation Age of Onset   Heart disease Mother 5       heart failure   Heart failure Mother    Pneumonia Father 67       pnemonia   Skin cancer Father    Skin cancer Sister    Skin cancer Son    Colon cancer Neg Hx    Esophageal cancer Neg Hx    Stomach cancer Neg Hx    Pancreatic cancer Neg Hx    Liver disease Neg Hx     Social History:  reports that he has never smoked. He has never used smokeless tobacco. He reports that he does not currently use alcohol. He reports that he does not use drugs.  ROS: Pertinent ROS in HPI  Physical Exam: Blood pressure 111/63, pulse 71, height 5\' 9"  (1.753 m), weight 200 lb (90.7 kg).  Constitutional:  Well nourished. Alert and oriented, No acute distress. HEENT: [place] AT, moist mucus membranes.  Trachea midline Cardiovascular: No clubbing, cyanosis, or edema. Respiratory: Normal respiratory effort, no increased work of breathing. Neurologic: Grossly intact, no focal deficits,  moving all 4 extremities. Psychiatric: Normal mood and affect.   Laboratory Data: BMP Latest Ref Rng & Units 01/13/2022 01/04/2022 12/23/2021  Glucose 70 - 99 mg/dL 95 102(H) 114(H)  BUN 8 - 27 mg/dL 19 19 26   Creatinine 0.76 - 1.27 mg/dL 1.09 1.31(H) 1.65(H)  BUN/Creat Ratio 10 - 24 17 15 16   Sodium 134 - 144  mmol/L 138 139 142  Potassium 3.5 - 5.2 mmol/L 4.2 4.4 5.0  Chloride 96 - 106 mmol/L 96 97 102  CO2 20 - 29 mmol/L 25 25 27   Calcium 8.6 - 10.2 mg/dL 8.9 9.1 8.9  I have reviewed the labs.   Pertinent Imaging:  01/13/22 14:13  Scan Result 28    Assessment & Plan:    1. Small capacity bladder/Incontinence -Discussion held with Mr. Tomaro and his daughter regarding his concerns about being incontinent of stool and urine while trying to heal a pressure wound in the buttocks area.  I expressed that while I understand that concern and it is a valid concern that I was also concerned that if we replace the Foley catheter at this time, especially since his bladder is emptying on its own, we will increase his incident of urinary tract infection and possibly sepsis as it would be exposed to his feces therefore increasing the risk of infection.  They are in the process of getting him to a different facility where hopefully he will have more attentive care and he is able to ambulate to the restroom on time and have soiled depends changed in a timely manner to reduce the amount of feces and urine his pressure wound would be exposed to and at this time I recommend to not replace the Foley and he and his daughter agree at this time  2. Renal cell cancer, right [facility]) -has appointment with Dr. Erlene Quan on 01/19/2022 for follow up imaging - Basic metabolic panel   Return for Keep follow up with Dr. Erlene Quan .  These notes generated with voice recognition software. I apologize for typographical errors.  Zara Council, PA-C  [facility] [address]  [place], [place] [address] [phone]   I spent 25 minutes on the day of the encounter to include pre-visit record review, face-to-face time with the patient and daughter discussing the risk benefits of having a Foley versus not having a Foley, and post-visit ordering of tests.

## 2022-01-27 DIAGNOSIS — I5030 Unspecified diastolic (congestive) heart failure: Secondary | ICD-10-CM | POA: Diagnosis not present

## 2022-01-29 DIAGNOSIS — I509 Heart failure, unspecified: Secondary | ICD-10-CM | POA: Diagnosis not present

## 2022-01-29 DIAGNOSIS — N139 Obstructive and reflux uropathy, unspecified: Secondary | ICD-10-CM | POA: Diagnosis not present

## 2022-01-29 DIAGNOSIS — I951 Orthostatic hypotension: Secondary | ICD-10-CM | POA: Diagnosis not present

## 2022-01-29 DIAGNOSIS — Z9189 Other specified personal risk factors, not elsewhere classified: Secondary | ICD-10-CM | POA: Diagnosis not present

## 2022-01-29 DIAGNOSIS — M069 Rheumatoid arthritis, unspecified: Secondary | ICD-10-CM | POA: Diagnosis not present

## 2022-01-29 DIAGNOSIS — J45909 Unspecified asthma, uncomplicated: Secondary | ICD-10-CM | POA: Diagnosis not present

## 2022-01-29 DIAGNOSIS — F32A Depression, unspecified: Secondary | ICD-10-CM | POA: Diagnosis not present

## 2022-01-29 DIAGNOSIS — F419 Anxiety disorder, unspecified: Secondary | ICD-10-CM | POA: Diagnosis not present

## 2022-01-29 DIAGNOSIS — G4700 Insomnia, unspecified: Secondary | ICD-10-CM | POA: Diagnosis not present

## 2022-01-31 NOTE — Progress Notes (Signed)
02/02/22 12:40 PM   [name] 06/15/1934 [id number]  Referring provider:  [name], MD [address] [place],  [place] [address] Chief Complaint  Patient presents with   Follow-up     HPI: [name] is a 88 y.o.male with a personal history of prostate cancer, small capacity bladder, and renal cell carcinoma, who presents today for a 1 year follow-up with CT abdomen and pelvis and CXR.   He is s/p radical prostatectomy in 1993 and salvage radiation in 1998. He developed small capacity bladder after salvage radiation. Cystoscopy in 2019 revealed high grade 1 or 2 trabeculation. He is managed with chronic indwelling Foley catheter.   He is s/p right radical nephrectomy in 2020. His postoperative course was complicated by an ileus resolved spontaneously and delayed mobility. Surgical pathology consistent with pT3aNx , Furhman grade 2 with invasion into the renal sinus.  He is currently on Myrbetriq for bladder spasms.   He was seen on 01/13/2022 by Zara Council, PA-C, at his visit on January 04, 2022, with her when the Foley exchange occurred it was difficult to ascertain whether Foley was in the correct spot so after long discussion they decided to leave the catheter out. On 01/13/2022 he was emptying adequately and it was decided not to replace the catheter.   Today, he reports that he is doing fine without his catheter.  He has been careful about hygiene and changing his pull-up frequently.  He actually feels freedom without having a tube.  Sodium and creatinine within normal limits.  PMH: Past Medical History:  Diagnosis Date   (HFpEF) heart failure with preserved ejection fraction ([place])    a. 02/2019 Echo: Ef 60-65%, mildly reduced RV fxn. RVSP 85mHg. Mildly dil LA. Mod dil RA. Mild to mod TR. Mild to mod AS. Triv AI.   Allergy    Anemia    Anxiety    Arthritis    rheumatoid   Asthma    Basal cell carcinoma    back   CAD (coronary artery disease)     CHF (congestive heart failure) (HCC)    Chronic kidney disease    Mass right kidney   Chronic sinusitis    Collagen vascular disease (HCC)    Rheumatoid Arthritis   Depression    Diverticulitis    Dysrhythmia    ED (erectile dysfunction)    GERD (gastroesophageal reflux disease)    History of SIADH    Hx of adenomatous colonic polyps    Hypertension    Hyponatremia    IBS (irritable bowel syndrome)    Neurodermatitis    Neurogenic bladder    OSA (obstructive sleep apnea)    no CPAP since weight loss   Persistent atrial fibrillation ([place]    a. Dx 02/2019; b. CHA2DS2VASc = 6-->eliquis initiated.   Prostate [facility] ([facility]    Renal mass    10/2018 4.2cm R renal cortical mass. Most compatible w/ clear cell renal cell carcinoma.    Surgical History: Past Surgical History:  Procedure Laterality Date   CARDIOVERSION N/A 04/23/2019   Procedure: CARDIOVERSION;  Surgeon: ENelva Bush MD;  Location: [facility] ORS;  Service: Cardiovascular;  Laterality: N/A;   KNEE ARTHROPLASTY Right 09/02/2015   Procedure: COMPUTER ASSISTED TOTAL KNEE ARTHROPLASTY;  Surgeon: JDereck Leep MD;  Location: [facility] ORS;  Service: Orthopedics;  Laterality: Right;   LAPAROSCOPIC NEPHRECTOMY, HAND ASSISTED Right 06/17/2019   Procedure: HAND ASSISTED LAPAROSCOPIC NEPHRECTOMY;  Surgeon: BHollice Espy MD;  Location: [facility] ORS;  Service: Urology;  Laterality: Right;   NASAL SINUS SURGERY  2009   DEVIATED SEPTUM AND POLYPS   permanent indwelling catheter     PROSTATE SURGERY     PROSTATECTOMY   TOTAL KNEE ARTHROPLASTY Left 9/15   Dr [name]   URETHRAL STRICTURE DILATATION  02-2010   Dr.Cope    Home Medications:  Allergies as of 02/01/2022       Reactions   Chlorhexidine    Ciprofloxacin Nausea And Vomiting   Headache   Citalopram Hydrobromide Other (See Comments)   "unknown"   Clindamycin Nausea And Vomiting   Lorazepam Other (See Comments)   Adverse reaction   Paroxetine Nausea Only   Ramipril Other  (See Comments)   "unknown"   Simvastatin    Sulfa Antibiotics Other (See Comments)        Medication List        Accurate as of February 01, 2022 11:59 PM. If you have any questions, ask your nurse or doctor.          STOP taking these medications    ALOE PO Stopped by: Hollice Espy, MD   clotrimazole-betamethasone cream Commonly known as: LOTRISONE Stopped by: Hollice Espy, MD   Co Q 10 100 MG Caps Stopped by: Hollice Espy, MD   Cranberry 250 MG Chew Stopped by: Hollice Espy, MD   fluconazole 100 MG tablet Commonly known as: DIFLUCAN Stopped by: Hollice Espy, MD   Gemtesa 75 MG Tabs Generic drug: Vibegron Stopped by: Hollice Espy, MD   hydrOXYzine 25 MG capsule Commonly known as: VISTARIL Stopped by: Hollice Espy, MD   Myrbetriq 50 MG Tb24 tablet Generic drug: mirabegron ER Stopped by: Hollice Espy, MD   nystatin cream Commonly known as: MYCOSTATIN Stopped by: Hollice Espy, MD   Omega 3 1200 MG Caps Stopped by: Hollice Espy, MD   omeprazole 20 MG capsule Commonly known as: PRILOSEC Stopped by: Hollice Espy, MD   ondansetron 4 MG tablet Commonly known as: ZOFRAN Stopped by: Hollice Espy, MD   oxyCODONE-acetaminophen 5-325 MG tablet Commonly known as: PERCOCET/ROXICET Stopped by: Hollice Espy, MD   QUEtiapine 25 MG tablet Commonly known as: SEROQUEL Stopped by: Hollice Espy, MD   Selenium 200 MCG Caps Stopped by: Hollice Espy, MD       TAKE these medications    acetaminophen 500 MG tablet Commonly known as: TYLENOL Take 500 mg by mouth 3 (three) times daily as needed for mild pain or moderate pain.   albuterol 108 (90 Base) MCG/ACT inhaler Commonly known as: VENTOLIN HFA Inhale 2 puffs into the lungs every 6 (six) hours as needed for wheezing or shortness of breath.   Align 4 MG Caps Take 1 capsule (4 mg total) by mouth daily.   ALPRAZolam 0.5 MG tablet Commonly known as: XANAX Take 1 tablet (0.5 mg  total) by mouth at bedtime.   apixaban 5 MG Tabs tablet Commonly known as: ELIQUIS Take 1 tablet (5 mg total) by mouth 2 (two) times daily. Started by: Mila Merry, RN   cetirizine 5 MG tablet Commonly known as: ZYRTEC Take 5 mg by mouth daily.   chlorhexidine 0.12 % solution Commonly known as: PERIDEX Use as directed 15 mLs in the mouth or throat daily as needed.   diclofenac Sodium 1 % Gel Commonly known as: VOLTAREN Apply topically.   EPINEPHrine 0.3 mg/0.3 mL Soaj injection Commonly known as: EPI-PEN Inject 0.3 mg into the muscle as needed for anaphylaxis.   fluticasone 50  MCG/ACT nasal spray Commonly known as: FLONASE Place 2 sprays into both nostrils 2 (two) times daily.   folic acid 532 MCG tablet Commonly known as: FOLVITE Take 1,200 mcg by mouth daily.   furosemide 40 MG tablet Commonly known as: LASIX Take 1 tablet (40 mg total) by mouth daily. Skip dose if SBP less than 110 mmHg   gabapentin 600 MG tablet Commonly known as: NEURONTIN Take 600 mg by mouth 2 (two) times daily at 8 am and 10 pm.   hydroxychloroquine 200 MG tablet Commonly known as: PLAQUENIL Take 200 mg by mouth 2 (two) times daily.   iron polysaccharides 150 MG capsule Commonly known as: NIFEREX Take 150 mg by mouth daily.   ketoconazole 2 % shampoo Commonly known as: NIZORAL Apply 1 application topically 2 (two) times a week. What changed: Another medication with the same name was removed. Continue taking this medication, and follow the directions you see here. Changed by: Hollice Espy, MD   melatonin 3 MG Tabs tablet Take 6 mg by mouth at bedtime.   methotrexate 2.5 MG tablet Commonly known as: RHEUMATREX Take 25 mg by mouth every Wednesday.   metoprolol succinate 25 MG 24 hr tablet Commonly known as: TOPROL-XL Take 25 mg by mouth daily. What changed: Another medication with the same name was removed. Continue taking this medication, and follow the directions you see  here. Changed by: Hollice Espy, MD   midodrine 5 MG tablet Commonly known as: PROAMATINE Take 1 tablet (5 mg total) by mouth 3 (three) times daily with meals. Skip dose if SBP greater than 110 and or HR greater than 100   multivitamin tablet Take 1 tablet by mouth daily.   polyethylene glycol powder 17 GM/SCOOP powder Commonly known as: GLYCOLAX/MIRALAX Take 17 g by mouth at bedtime.   pravastatin 20 MG tablet Commonly known as: PRAVACHOL TAKE 1 TABLET(20 MG) BY MOUTH AT BEDTIME   traZODone 50 MG tablet Commonly known as: DESYREL Take 50 mg by mouth at bedtime.   triamcinolone ointment 0.1 % Commonly known as: KENALOG Apply topically.   Turmeric 500 MG Caps Take 500 mg by mouth daily.   vitamin B-12 1000 MCG tablet Commonly known as: CYANOCOBALAMIN Take 1,000 mcg by mouth daily.   vitamin C 1000 MG tablet Take 1,000 mg by mouth daily.   Vitamin D3 50 MCG (2000 UT) Tabs Take 2,000 Units by mouth daily.   Zinc 50 MG Tabs Take 1 tablet by mouth daily.        Allergies:  Allergies  Allergen Reactions   Chlorhexidine    Ciprofloxacin Nausea And Vomiting    Headache   Citalopram Hydrobromide Other (See Comments)    "unknown"   Clindamycin Nausea And Vomiting   Lorazepam Other (See Comments)    Adverse reaction   Paroxetine Nausea Only   Ramipril Other (See Comments)    "unknown"   Simvastatin    Sulfa Antibiotics Other (See Comments)    Family History: Family History  Problem Relation Age of Onset   Heart disease Mother 84       heart failure   Heart failure Mother    Pneumonia Father 51       pnemonia   Skin cancer Father    Skin cancer Sister    Skin cancer Son    Colon cancer Neg Hx    Esophageal cancer Neg Hx    Stomach cancer Neg Hx    Pancreatic cancer Neg Hx  Liver disease Neg Hx     Social History:  reports that he has never smoked. He has never used smokeless tobacco. He reports that he does not currently use alcohol. He reports  that he does not use drugs.   Physical Exam: BP 100/64    Pulse 84    Ht '5\' 9"'$  (1.753 m)    Wt 195 lb (88.5 kg)    BMI 28.80 kg/m   In wheelchair accompanied by his daughter today. Constitutional:  Alert and oriented, No acute distress. HEENT: [place] AT, moist mucus membranes.  Trachea midline, no masses. Cardiovascular: No clubbing, cyanosis, or edema. Respiratory: Normal respiratory effort, no increased work of breathing. Skin: No rashes, bruises or suspicious lesions. Neurologic: Grossly intact, no focal deficits, moving all 4 extremities. Psychiatric: Normal mood and affect.  Laboratory Data: Lab Results  Component Value Date   CREATININE 1.09 01/13/2022   Lab Results  Component Value Date   PSA 0.00 (L) 04/27/2017   PSA 0.01 (L) 05/18/2016   PSA 0.01 (L) 01/26/2015   Lab Results  Component Value Date   HGBA1C 5.1 05/26/2018    Pertinent Imaging: CT abdomen pelvis with contrast as well as chest x-ray personally reviewed, no evidence of disease.  Agree with radiologic interpretation.  Assessment & Plan:    1. Renal cell cancer, right ([place]) Status post nephrectomy pT3 in 2020 years ago without evidence of recurrence of most recent imaging  We discussed whether or not to continue surveillance imaging, could offer to do this for a total of 5 years however given his age and comorbidities, it is reasonable to go ahead and stop at this point, both he and his daughter are agreeable this plan  2. Neurogenic bladder Small capacity nonfunctioning bladder  3. Continuous leakage of urine No longer with indwelling Foley catheter, leaking constantly however no evidence of retention or renal compromise.  We discussed alternatives for management of his continence including the option for condom catheter.  He was supplied with a sample today of a 30 mm condom cath.  He will let us know if he would like supplies for this.    Conley Rolls as a Education administrator for Hollice Espy,  MD.,have documented all relevant documentation on the behalf of Hollice Espy, MD,as directed by  Hollice Espy, MD while in the presence of Hollice Espy, MD.  I have reviewed the above documentation for accuracy and completeness, and I agree with the above.   Hollice Espy, MD   [facility] [address] [place], [place] [address] [phone]

## 2022-02-01 ENCOUNTER — Ambulatory Visit (INDEPENDENT_AMBULATORY_CARE_PROVIDER_SITE_OTHER): Payer: Medicare PPO | Admitting: Urology

## 2022-02-01 ENCOUNTER — Telehealth: Payer: Self-pay | Admitting: Emergency Medicine

## 2022-02-01 ENCOUNTER — Other Ambulatory Visit: Payer: Self-pay

## 2022-02-01 ENCOUNTER — Encounter: Payer: Self-pay | Admitting: Urology

## 2022-02-01 VITALS — BP 100/64 | HR 84 | Ht 69.0 in | Wt 195.0 lb

## 2022-02-01 DIAGNOSIS — N3945 Continuous leakage: Secondary | ICD-10-CM | POA: Diagnosis not present

## 2022-02-01 DIAGNOSIS — C641 Malignant neoplasm of right kidney, except renal pelvis: Secondary | ICD-10-CM

## 2022-02-01 DIAGNOSIS — N319 Neuromuscular dysfunction of bladder, unspecified: Secondary | ICD-10-CM | POA: Diagnosis not present

## 2022-02-01 DIAGNOSIS — M6281 Muscle weakness (generalized): Secondary | ICD-10-CM | POA: Diagnosis not present

## 2022-02-01 MED ORDER — APIXABAN 5 MG PO TABS: 5.0000 mg | ORAL_TABLET | Freq: Two times a day (BID) | ORAL | 3 refills | Status: DC

## 2022-02-01 NOTE — Telephone Encounter (Signed)
Called and spoke with [name], pt's daughter (OK per DPR). [name] reports that he has moved into an assisted living facility in [place] with his wife, and will need to have a written prescription for the eliquis with instructions, and also written order for the CBC in one month.  ? ?They will pick it up tomorrow, 3/8, as patient has appt upstairs at [facility].  ? ? ?

## 2022-02-01 NOTE — Telephone Encounter (Signed)
-----   Message from Lawler, PA-C sent at 01/28/2022  3:43 PM EST ----- ?Regarding: restart ELiquis ?Discussed with MD and he recommends resuming Eliquis '5mg'$ BID and CBC in 1 month. ? ?----- Message ----- ?From: Wellington Hampshire, MD ?Sent: 01/27/2022   9:53 AM EST ?To: Cadence Ninfa Meeker, PA-C ? ?I think it is reasonable to resume Eliquis at the present time and check CBC in 1 month. ? ? ?----- Message ----- ?From: Kathlen Mody, Cadence H, PA-C ?Sent: 01/25/2022  10:47 AM EST ?To: Wellington Hampshire, MD ? ?1 mth follow-up. H/o afib. Eliquis previously held for anemia during admission. Most recent Hgb was 10. CHADSASC at least 5 with prior TIA. Has been in NSR during recent office visits. Also a fall risk.Dr. Fletcher Anon, I would like your opinion regarding restarting Eliquis ? thanks ? ? ?

## 2022-02-02 DIAGNOSIS — M6281 Muscle weakness (generalized): Secondary | ICD-10-CM | POA: Diagnosis not present

## 2022-02-02 MED ORDER — APIXABAN 5 MG PO TABS: 5.0000 mg | ORAL_TABLET | Freq: Two times a day (BID) | ORAL | 3 refills | Status: AC

## 2022-02-02 NOTE — Addendum Note (Signed)
Addended by: Mila Merry on: 02/02/2022 07:39 AM ? ? Modules accepted: Orders ? ?

## 2022-02-03 ENCOUNTER — Other Ambulatory Visit: Payer: Self-pay

## 2022-02-03 ENCOUNTER — Ambulatory Visit (INDEPENDENT_AMBULATORY_CARE_PROVIDER_SITE_OTHER): Payer: Medicare PPO

## 2022-02-03 VITALS — BP 117/60 | HR 96 | Temp 97.4°F | Resp 20

## 2022-02-03 DIAGNOSIS — M6281 Muscle weakness (generalized): Secondary | ICD-10-CM | POA: Diagnosis not present

## 2022-02-03 DIAGNOSIS — J4551 Severe persistent asthma with (acute) exacerbation: Secondary | ICD-10-CM

## 2022-02-03 MED ORDER — ALBUTEROL SULFATE HFA 108 (90 BASE) MCG/ACT IN AERS: 2.0000 | INHALATION_SPRAY | Freq: Once | RESPIRATORY_TRACT | Status: DC | PRN

## 2022-02-03 MED ORDER — EPINEPHRINE 0.3 MG/0.3ML IJ SOAJ: 0.3000 mg | Freq: Once | INTRAMUSCULAR | Status: DC | PRN

## 2022-02-03 MED ORDER — SODIUM CHLORIDE 0.9 % IV SOLN: Freq: Once | INTRAVENOUS | Status: DC | PRN

## 2022-02-03 MED ORDER — METHYLPREDNISOLONE SODIUM SUCC 125 MG IJ SOLR: 125.0000 mg | Freq: Once | INTRAMUSCULAR | Status: DC | PRN

## 2022-02-03 MED ORDER — FAMOTIDINE IN NACL 20-0.9 MG/50ML-% IV SOLN: 20.0000 mg | Freq: Once | INTRAVENOUS | Status: DC | PRN

## 2022-02-03 MED ORDER — OMALIZUMAB 75 MG/0.5ML ~~LOC~~ SOSY
375.0000 mg | PREFILLED_SYRINGE | Freq: Once | SUBCUTANEOUS | Status: AC
  Administered 2022-02-03: 15:00:00 375 mg via SUBCUTANEOUS

## 2022-02-03 MED ORDER — DIPHENHYDRAMINE HCL 50 MG/ML IJ SOLN: 50.0000 mg | Freq: Once | INTRAMUSCULAR | Status: DC | PRN

## 2022-02-03 NOTE — Progress Notes (Signed)
Diagnosis: Asthma ? ?Provider:  Marshell Garfinkel, MD ? ?Procedure: Injection ? ?Xolair (Omalizumab), Dose: 375 mg, Site: subcutaneous, Number of injections: 3 ? ?Discharge: Condition: Good, Destination: Rehab . AVS provided to patient.  ? ?Performed by:  Koren Shiver, RN  ? ? ? ?  ?

## 2022-02-08 DIAGNOSIS — E08311 Diabetes mellitus due to underlying condition with unspecified diabetic retinopathy with macular edema: Secondary | ICD-10-CM | POA: Diagnosis not present

## 2022-02-08 DIAGNOSIS — M6281 Muscle weakness (generalized): Secondary | ICD-10-CM | POA: Diagnosis not present

## 2022-02-08 DIAGNOSIS — E559 Vitamin D deficiency, unspecified: Secondary | ICD-10-CM | POA: Diagnosis not present

## 2022-02-08 DIAGNOSIS — E039 Hypothyroidism, unspecified: Secondary | ICD-10-CM | POA: Diagnosis not present

## 2022-02-08 DIAGNOSIS — E785 Hyperlipidemia, unspecified: Secondary | ICD-10-CM | POA: Diagnosis not present

## 2022-02-08 DIAGNOSIS — D519 Vitamin B12 deficiency anemia, unspecified: Secondary | ICD-10-CM | POA: Diagnosis not present

## 2022-02-09 DIAGNOSIS — M0579 Rheumatoid arthritis with rheumatoid factor of multiple sites without organ or systems involvement: Secondary | ICD-10-CM | POA: Diagnosis not present

## 2022-02-09 DIAGNOSIS — C641 Malignant neoplasm of right kidney, except renal pelvis: Secondary | ICD-10-CM | POA: Diagnosis not present

## 2022-02-09 DIAGNOSIS — M6281 Muscle weakness (generalized): Secondary | ICD-10-CM | POA: Diagnosis not present

## 2022-02-10 DIAGNOSIS — M6281 Muscle weakness (generalized): Secondary | ICD-10-CM | POA: Diagnosis not present

## 2022-02-11 ENCOUNTER — Encounter (HOSPITAL_COMMUNITY): Payer: Self-pay

## 2022-02-11 ENCOUNTER — Emergency Department (HOSPITAL_COMMUNITY): Payer: Medicare PPO

## 2022-02-11 ENCOUNTER — Inpatient Hospital Stay (HOSPITAL_COMMUNITY)
Admission: EM | Admit: 2022-02-11 | Discharge: 2022-02-16 | DRG: 871 | Disposition: A | Discharge status: Skilled Nursing Facility | Payer: Medicare PPO | Source: Skilled Nursing Facility | Attending: Internal Medicine | Admitting: Internal Medicine

## 2022-02-11 ENCOUNTER — Other Ambulatory Visit: Payer: Self-pay

## 2022-02-11 DIAGNOSIS — E876 Hypokalemia: Secondary | ICD-10-CM | POA: Diagnosis not present

## 2022-02-11 DIAGNOSIS — Z8249 Family history of ischemic heart disease and other diseases of the circulatory system: Secondary | ICD-10-CM | POA: Diagnosis not present

## 2022-02-11 DIAGNOSIS — I480 Paroxysmal atrial fibrillation: Secondary | ICD-10-CM | POA: Diagnosis not present

## 2022-02-11 DIAGNOSIS — G629 Polyneuropathy, unspecified: Secondary | ICD-10-CM | POA: Diagnosis present

## 2022-02-11 DIAGNOSIS — Z85528 Personal history of other malignant neoplasm of kidney: Secondary | ICD-10-CM

## 2022-02-11 DIAGNOSIS — R6521 Severe sepsis with septic shock: Secondary | ICD-10-CM | POA: Diagnosis present

## 2022-02-11 DIAGNOSIS — K219 Gastro-esophageal reflux disease without esophagitis: Secondary | ICD-10-CM | POA: Diagnosis present

## 2022-02-11 DIAGNOSIS — L8990 Pressure ulcer of unspecified site, unspecified stage: Secondary | ICD-10-CM | POA: Diagnosis present

## 2022-02-11 DIAGNOSIS — T17920A Food in respiratory tract, part unspecified causing asphyxiation, initial encounter: Secondary | ICD-10-CM | POA: Diagnosis not present

## 2022-02-11 DIAGNOSIS — Z79899 Other long term (current) drug therapy: Secondary | ICD-10-CM | POA: Diagnosis not present

## 2022-02-11 DIAGNOSIS — N182 Chronic kidney disease, stage 2 (mild): Secondary | ICD-10-CM | POA: Diagnosis present

## 2022-02-11 DIAGNOSIS — R059 Cough, unspecified: Secondary | ICD-10-CM | POA: Diagnosis present

## 2022-02-11 DIAGNOSIS — T17900A Unspecified foreign body in respiratory tract, part unspecified causing asphyxiation, initial encounter: Secondary | ICD-10-CM | POA: Diagnosis not present

## 2022-02-11 DIAGNOSIS — R54 Age-related physical debility: Secondary | ICD-10-CM | POA: Diagnosis not present

## 2022-02-11 DIAGNOSIS — F39 Unspecified mood [affective] disorder: Secondary | ICD-10-CM | POA: Diagnosis not present

## 2022-02-11 DIAGNOSIS — Z881 Allergy status to other antibiotic agents status: Secondary | ICD-10-CM | POA: Diagnosis not present

## 2022-02-11 DIAGNOSIS — J90 Pleural effusion, not elsewhere classified: Secondary | ICD-10-CM | POA: Diagnosis not present

## 2022-02-11 DIAGNOSIS — Z96653 Presence of artificial knee joint, bilateral: Secondary | ICD-10-CM | POA: Diagnosis present

## 2022-02-11 DIAGNOSIS — M0579 Rheumatoid arthritis with rheumatoid factor of multiple sites without organ or systems involvement: Secondary | ICD-10-CM | POA: Diagnosis present

## 2022-02-11 DIAGNOSIS — F419 Anxiety disorder, unspecified: Secondary | ICD-10-CM | POA: Diagnosis present

## 2022-02-11 DIAGNOSIS — A419 Sepsis, unspecified organism: Secondary | ICD-10-CM | POA: Diagnosis not present

## 2022-02-11 DIAGNOSIS — Z20822 Contact with and (suspected) exposure to covid-19: Secondary | ICD-10-CM | POA: Diagnosis present

## 2022-02-11 DIAGNOSIS — J690 Pneumonitis due to inhalation of food and vomit: Secondary | ICD-10-CM | POA: Diagnosis present

## 2022-02-11 DIAGNOSIS — T17908A Unspecified foreign body in respiratory tract, part unspecified causing other injury, initial encounter: Principal | ICD-10-CM

## 2022-02-11 DIAGNOSIS — I350 Nonrheumatic aortic (valve) stenosis: Secondary | ICD-10-CM | POA: Diagnosis present

## 2022-02-11 DIAGNOSIS — I9589 Other hypotension: Secondary | ICD-10-CM | POA: Diagnosis present

## 2022-02-11 DIAGNOSIS — E871 Hypo-osmolality and hyponatremia: Secondary | ICD-10-CM | POA: Diagnosis present

## 2022-02-11 DIAGNOSIS — Z888 Allergy status to other drugs, medicaments and biological substances status: Secondary | ICD-10-CM

## 2022-02-11 DIAGNOSIS — L89152 Pressure ulcer of sacral region, stage 2: Secondary | ICD-10-CM | POA: Diagnosis not present

## 2022-02-11 DIAGNOSIS — R8271 Bacteriuria: Secondary | ICD-10-CM | POA: Diagnosis not present

## 2022-02-11 DIAGNOSIS — I959 Hypotension, unspecified: Secondary | ICD-10-CM | POA: Diagnosis not present

## 2022-02-11 DIAGNOSIS — J45909 Unspecified asthma, uncomplicated: Secondary | ICD-10-CM | POA: Diagnosis present

## 2022-02-11 DIAGNOSIS — N319 Neuromuscular dysfunction of bladder, unspecified: Secondary | ICD-10-CM | POA: Diagnosis not present

## 2022-02-11 DIAGNOSIS — Z882 Allergy status to sulfonamides status: Secondary | ICD-10-CM | POA: Diagnosis not present

## 2022-02-11 DIAGNOSIS — G4733 Obstructive sleep apnea (adult) (pediatric): Secondary | ICD-10-CM | POA: Diagnosis present

## 2022-02-11 DIAGNOSIS — I5032 Chronic diastolic (congestive) heart failure: Secondary | ICD-10-CM | POA: Diagnosis not present

## 2022-02-11 DIAGNOSIS — I130 Hypertensive heart and chronic kidney disease with heart failure and stage 1 through stage 4 chronic kidney disease, or unspecified chronic kidney disease: Secondary | ICD-10-CM | POA: Diagnosis present

## 2022-02-11 DIAGNOSIS — Z7901 Long term (current) use of anticoagulants: Secondary | ICD-10-CM | POA: Diagnosis not present

## 2022-02-11 DIAGNOSIS — Z7401 Bed confinement status: Secondary | ICD-10-CM | POA: Diagnosis not present

## 2022-02-11 DIAGNOSIS — E785 Hyperlipidemia, unspecified: Secondary | ICD-10-CM | POA: Diagnosis present

## 2022-02-11 DIAGNOSIS — I2510 Atherosclerotic heart disease of native coronary artery without angina pectoris: Secondary | ICD-10-CM | POA: Diagnosis present

## 2022-02-11 DIAGNOSIS — D509 Iron deficiency anemia, unspecified: Secondary | ICD-10-CM | POA: Diagnosis not present

## 2022-02-11 DIAGNOSIS — R4020 Unspecified coma: Secondary | ICD-10-CM | POA: Diagnosis not present

## 2022-02-11 DIAGNOSIS — Z8546 Personal history of malignant neoplasm of prostate: Secondary | ICD-10-CM

## 2022-02-11 DIAGNOSIS — J4550 Severe persistent asthma, uncomplicated: Secondary | ICD-10-CM

## 2022-02-11 DIAGNOSIS — I4819 Other persistent atrial fibrillation: Secondary | ICD-10-CM | POA: Diagnosis present

## 2022-02-11 DIAGNOSIS — R0602 Shortness of breath: Secondary | ICD-10-CM | POA: Diagnosis not present

## 2022-02-11 LAB — CBC WITH DIFFERENTIAL/PLATELET
Abs Immature Granulocytes: 0.33 10*3/uL — ABNORMAL HIGH (ref 0.00–0.07)
Basophils Absolute: 0.1 10*3/uL (ref 0.0–0.1)
Basophils Relative: 1 %
Eosinophils Absolute: 0.1 10*3/uL (ref 0.0–0.5)
Eosinophils Relative: 1 %
HCT: 29.6 % — ABNORMAL LOW (ref 39.0–52.0)
Hemoglobin: 9.3 g/dL — ABNORMAL LOW (ref 13.0–17.0)
Immature Granulocytes: 2 %
Lymphocytes Relative: 3 %
Lymphs Abs: 0.6 10*3/uL — ABNORMAL LOW (ref 0.7–4.0)
MCH: 30.4 pg (ref 26.0–34.0)
MCHC: 31.4 g/dL (ref 30.0–36.0)
MCV: 96.7 fL (ref 80.0–100.0)
Monocytes Absolute: 0.5 10*3/uL (ref 0.1–1.0)
Monocytes Relative: 3 %
Neutro Abs: 16 10*3/uL — ABNORMAL HIGH (ref 1.7–7.7)
Neutrophils Relative %: 90 %
Platelets: 255 10*3/uL (ref 150–400)
RBC: 3.06 MIL/uL — ABNORMAL LOW (ref 4.22–5.81)
RDW: 15 % (ref 11.5–15.5)
WBC: 17.6 10*3/uL — ABNORMAL HIGH (ref 4.0–10.5)
nRBC: 0 % (ref 0.0–0.2)

## 2022-02-11 LAB — BLOOD GAS, ARTERIAL
Acid-Base Excess: 5.5 mmol/L — ABNORMAL HIGH (ref 0.0–2.0)
Bicarbonate: 30.5 mmol/L — ABNORMAL HIGH (ref 20.0–28.0)
FIO2: 100 %
O2 Saturation: 99.7 %
Patient temperature: 37
pCO2 arterial: 46 mmHg (ref 32–48)
pH, Arterial: 7.43 (ref 7.35–7.45)
pO2, Arterial: 143 mmHg — ABNORMAL HIGH (ref 83–108)

## 2022-02-11 LAB — CBC
HCT: 26.7 % — ABNORMAL LOW (ref 39.0–52.0)
Hemoglobin: 8.4 g/dL — ABNORMAL LOW (ref 13.0–17.0)
MCH: 30.9 pg (ref 26.0–34.0)
MCHC: 31.5 g/dL (ref 30.0–36.0)
MCV: 98.2 fL (ref 80.0–100.0)
Platelets: 232 10*3/uL (ref 150–400)
RBC: 2.72 MIL/uL — ABNORMAL LOW (ref 4.22–5.81)
RDW: 15.1 % (ref 11.5–15.5)
WBC: 16.3 10*3/uL — ABNORMAL HIGH (ref 4.0–10.5)
nRBC: 0 % (ref 0.0–0.2)

## 2022-02-11 LAB — RESP PANEL BY RT-PCR (FLU A&B, COVID) ARPGX2
Influenza A by PCR: NEGATIVE
Influenza B by PCR: NEGATIVE
SARS Coronavirus 2 by RT PCR: NEGATIVE

## 2022-02-11 LAB — URINALYSIS, ROUTINE W REFLEX MICROSCOPIC
Bilirubin Urine: NEGATIVE
Glucose, UA: NEGATIVE mg/dL
Hgb urine dipstick: NEGATIVE
Ketones, ur: NEGATIVE mg/dL
Leukocytes,Ua: NEGATIVE
Nitrite: NEGATIVE
Protein, ur: NEGATIVE mg/dL
Specific Gravity, Urine: 1.011 (ref 1.005–1.030)
pH: 5 (ref 5.0–8.0)

## 2022-02-11 LAB — COMPREHENSIVE METABOLIC PANEL
ALT: 16 U/L (ref 0–44)
AST: 27 U/L (ref 15–41)
Albumin: 2.3 g/dL — ABNORMAL LOW (ref 3.5–5.0)
Alkaline Phosphatase: 60 U/L (ref 38–126)
Anion gap: 8 (ref 5–15)
BUN: 27 mg/dL — ABNORMAL HIGH (ref 8–23)
CO2: 27 mmol/L (ref 22–32)
Calcium: 7.8 mg/dL — ABNORMAL LOW (ref 8.9–10.3)
Chloride: 96 mmol/L — ABNORMAL LOW (ref 98–111)
Creatinine, Ser: 1.05 mg/dL (ref 0.61–1.24)
GFR, Estimated: >60 mL/min (ref >60)
Glucose, Bld: 143 mg/dL — ABNORMAL HIGH (ref 70–99)
Potassium: 3.9 mmol/L (ref 3.5–5.1)
Sodium: 131 mmol/L — ABNORMAL LOW (ref 135–145)
Total Bilirubin: 0.4 mg/dL (ref 0.3–1.2)
Total Protein: 6.2 g/dL — ABNORMAL LOW (ref 6.5–8.1)

## 2022-02-11 LAB — MRSA NEXT GEN BY PCR, NASAL: MRSA by PCR Next Gen: NOT DETECTED

## 2022-02-11 LAB — LACTIC ACID, PLASMA
Lactic Acid, Venous: 1.7 mmol/L (ref 0.5–1.9)
Lactic Acid, Venous: 1.2 mmol/L (ref 0.5–1.9)

## 2022-02-11 LAB — APTT: aPTT: 38 seconds — ABNORMAL HIGH (ref 24–36)

## 2022-02-11 LAB — PROTIME-INR
INR: 1.6 — ABNORMAL HIGH (ref 0.8–1.2)
Prothrombin Time: 19.4 seconds — ABNORMAL HIGH (ref 11.4–15.2)

## 2022-02-11 MED ORDER — HEPARIN SODIUM (PORCINE) 5000 UNIT/ML IJ SOLN
5000.0000 [IU] | Freq: Three times a day (TID) | INTRAMUSCULAR | Status: DC
  Administered 2022-02-11 – 2022-02-13 (×6): 5000 [IU] via SUBCUTANEOUS
  Filled 2022-02-11 (×6): qty 1

## 2022-02-11 MED ORDER — CHLORHEXIDINE GLUCONATE CLOTH 2 % EX PADS: 6.0000 | MEDICATED_PAD | Freq: Every day | CUTANEOUS | Status: DC

## 2022-02-11 MED ORDER — LACTATED RINGERS IV BOLUS (SEPSIS)
750.0000 mL | Freq: Once | INTRAVENOUS | Status: AC
  Administered 2022-02-11: 750 mL via INTRAVENOUS

## 2022-02-11 MED ORDER — DOCUSATE SODIUM 100 MG PO CAPS: 100.0000 mg | ORAL_CAPSULE | Freq: Two times a day (BID) | ORAL | Status: DC | PRN

## 2022-02-11 MED ORDER — PIPERACILLIN-TAZOBACTAM 3.375 G IVPB 30 MIN
3.3750 g | Freq: Once | INTRAVENOUS | Status: AC
Start: 2022-02-11 — End: 2022-02-11
  Administered 2022-02-11: 3.375 g via INTRAVENOUS
  Filled 2022-02-11: qty 50

## 2022-02-11 MED ORDER — POLYETHYLENE GLYCOL 3350 17 G PO PACK: 17.0000 g | PACK | Freq: Every day | ORAL | Status: DC | PRN

## 2022-02-11 MED ORDER — LACTATED RINGERS IV SOLN: INTRAVENOUS | Status: DC

## 2022-02-11 MED ORDER — NOREPINEPHRINE 4 MG/250ML-% IV SOLN
2.0000 ug/min | INTRAVENOUS | Status: DC
  Administered 2022-02-11: 3 ug/min via INTRAVENOUS
  Filled 2022-02-11: qty 250

## 2022-02-11 MED ORDER — SODIUM CHLORIDE 0.9 % IV SOLN
250.0000 mL | INTRAVENOUS | Status: DC
  Administered 2022-02-11: 250 mL via INTRAVENOUS

## 2022-02-11 MED ORDER — LACTATED RINGERS IV BOLUS (SEPSIS)
2000.0000 mL | Freq: Once | INTRAVENOUS | Status: AC
  Administered 2022-02-11: 2000 mL via INTRAVENOUS

## 2022-02-11 MED ORDER — ACETAMINOPHEN 650 MG RE SUPP
650.0000 mg | Freq: Once | RECTAL | Status: AC
  Administered 2022-02-11: 650 mg via RECTAL
  Filled 2022-02-11: qty 1

## 2022-02-11 MED ORDER — SODIUM CHLORIDE 0.9 % IV SOLN
3.0000 g | Freq: Four times a day (QID) | INTRAVENOUS | Status: DC
  Administered 2022-02-11 – 2022-02-13 (×7): 3 g via INTRAVENOUS
  Filled 2022-02-11 (×9): qty 8

## 2022-02-11 MED ORDER — NOREPINEPHRINE 4 MG/250ML-% IV SOLN
0.0000 ug/min | INTRAVENOUS | Status: DC
  Administered 2022-02-11: 2 ug/min via INTRAVENOUS
  Filled 2022-02-11: qty 250

## 2022-02-11 MED ORDER — VANCOMYCIN HCL 1750 MG/350ML IV SOLN
1750.0000 mg | Freq: Once | INTRAVENOUS | Status: AC
  Administered 2022-02-11: 1750 mg via INTRAVENOUS
  Filled 2022-02-11: qty 350

## 2022-02-11 NOTE — ED Notes (Signed)
Spo2 68% upon entry to room. MD also at bedside. Oxygen became disconnected, pt reconnected to oxygen, back up to 99% on 13L NRB ?

## 2022-02-11 NOTE — Progress Notes (Addendum)
Daughter [name] dropped off bilateral hearing aids and cellphone with charger for patient. Will continue to monitor.  ?

## 2022-02-11 NOTE — Progress Notes (Addendum)
Pharmacy Antibiotic Note ? ?[name] is a 88 y.o. male admitted on 02/11/2022 with concerns for aspiration at facility. Pharmacy has been consulted for Unasyn dosing for aspiration pneumonia. ? ?Plan: ?Unasyn 3 g IV q6h ? ?Pharmacy to sign off consult, but will continue to follow along for necessary dose adjustments, culture data, de-escalation, etc. ? ?Height: '5\' 9"'$  (175.3 cm) ?Weight: 90.7 kg (200 lb) ?IBW/kg (Calculated) : 70.7 ? ?Temp (24hrs), Avg:101.1 ?F (38.4 ?C), Min:99 ?F (37.2 ?C), Max:103.7 ?F (39.8 ?C) ? ?Recent Labs  ?Lab 02/11/22 ?1207 02/11/22 ?1407  ?WBC 17.6*  --   ?CREATININE 1.05  --   ?LATICACIDVEN 1.7 1.2  ?  ?Estimated Creatinine Clearance: 57.3 mL/min (by C-G formula based on SCr of 1.05 mg/dL).   ? ?Allergies  ?Allergen Reactions  ? Ciprofloxacin Nausea And Vomiting  ?  Headache  ? Citalopram Hydrobromide Other (See Comments)  ?  Citalopram and Hydrobromide listed as separate allergies on MAR. Unknown reaction  ? Clindamycin Nausea And Vomiting  ? Lorazepam Other (See Comments)  ?  Adverse reaction  ? Paroxetine Nausea Only  ? Ramipril Other (See Comments)  ?  "unknown"  ? Simvastatin   ? Sulfa Antibiotics Other (See Comments)  ? ? ?Antimicrobials this admission: ?Unasyn 3/17 >> ?Zosyn 3/17 x 1 ? ?Dose adjustments this admission: N/A ? ?Microbiology results: ?3/17 BCx: pending ?3/17 UCx: ordered  ? ?Thank you for allowing pharmacy to be a part of this patient?s care. ? ?Tawnya Crook, PharmD, BCPS ?Clinical Pharmacist ?02/11/2022 3:41 PM ? ? ?

## 2022-02-11 NOTE — ED Triage Notes (Signed)
Pt coming from [facility] via EMS. Facility reports that patient aspirated on applesauce today and then went unresponsive. O2 was 43 initially at facility. O2 was 20% when fire arrived. Pt on 15 L NRB at 92% with EMS. Patient also reports not feeling well yesterday. A&Ox4. Warm to touch. Baseline trouble swallowing. Lung sounds bubbly and crackling Pt currently spitting up. ?

## 2022-02-11 NOTE — ED Notes (Signed)
Patient's nurse and nurse tech attempted to straight catheterize patient to obtain urine sample. Was not able to obtain enough urine for a sample. DO aware. ?

## 2022-02-11 NOTE — Progress Notes (Signed)
Elink following Code Sepsis bundle. ?

## 2022-02-11 NOTE — H&P (Addendum)
? ?NAME:  [name], MRN:  [id number], DOB:  08/12/1934, LOS: 0 ?ADMISSION DATE:  02/11/2022, CONSULTATION DATE:  02/11/2022 ?REFERRING MD:  Dr. Tyrone Nine, CHIEF COMPLAINT: Sepsis secondary to aspiration pneumonia ? ?History of Present Illness:  ?[name] is a 88 year old male with past medical history significant for diastolic congestive heart failure, CAD, CKD, anxiety, GERD, hypertension, obstructive sleep apnea, prostate cancer, and depression who presented from local skilled nursing facility after suffering aspiration event.  Per report patient was eating applesauce and suffered aspiration event resulting in respiratory distress. ? ?Patient was placed on nonrebreather per EMS for oxygen saturations in the 70s and prior to arrival to ED oxygen saturations have improved to 90% additional abnormal vital signs included mild tachycardia, hypotension and eventual development of fever of 103.7.  Pertinent lab work included sodium 131, chloride 96, glucose 143, BUN 27, albumin 2.3, WBC 17.6, hemoglobin 9.3.  Concerns for sepsis and continued hypotension requiring initiation of vasopressors PCCM consulted for further management and admission ? ?Pertinent  Medical History  ?Diastolic congestive heart failure ?CAD ?CKD ?Anxiety ?GERD ?Hypertension ?OSA ?Prostate cancer ?Depression ? ?Significant Hospital Events: ?Including procedures, antibiotic start and stop dates in addition to other pertinent events   ?3/17 admitted for sepsis following aspiration event at SNF low-dose pressor started in ED ? ?Interim History / Subjective:  ?As above ? ?Objective   ?Blood pressure (!) 107/46, pulse 95, temperature (!) 100.7 ?F (38.2 ?C), temperature source Axillary, resp. rate (!) 28, height '5\' 9"'$  (1.753 m), weight 90.7 kg, SpO2 99 %. ?   ?   ? ?Intake/Output Summary (Last 24 hours) at 02/11/2022 1458 ?Last data filed at 02/11/2022 1429 ?Gross per 24 hour  ?Intake 2000 ml  ?Output --  ?Net 2000 ml  ? ?Filed Weights  ? 02/11/22 1209   ?Weight: 90.7 kg  ? ? ?Examination: ?Exam performed by attending  ? ?Resolved Hospital Problem list   ? ? ?Assessment & Plan:  ?Sepsis in the setting of aspiration pneumonia ?-On ED presentation patient was seen mildly tachypneic, tachycardic, hypoxic, and febrile with Tmax of 103.7, and leucocytosis. CXR concerning for PNA ?-Patient had a witnessed aspiration event at SNF prior to ED presentation ?P: ?Admit ICU ?Continue supplemental oxygen for sat goal> 90 ?Pan cultures prior to antibiotic ?IV Unasyn  ?Continue pressors for MAP goal > 65 ?Continue home midodrine when safe to take orals  ?Monitor urine output ? ?Hx of diastolic CHF with moderate aortic stenosis  ?-ECHO 12/12/2021 EF 10-62%IRSW grade I diastolic dysfunction  ?Hx of HTN/HLD ?-Home medications include lasix, Toprol-XL, pravastatin,  ?P: ?Continuous telemetry  ?Resume home medications once SLP evaluated for swallow ?Strict intake and output  ?Daily weight to assess volume status ?Optimize electrolytes  ?Closely monitor renal function and electrolytes  ?Supplemental oxygen as above ? ?Hx of paroxysmal A-fib  ?-PO Eliquis stopped per cardiology at last hospitalization January of this year  ?P: ?Continue Beta blocker as able ? ?Hyponatremia  ?P: ?Trend Bmet  ?IV hydration provided on admit  ? ?Anemia  ?P: ?Trend CBC  ?Transfuse per protocol  ?Hgb goal > 7 ? ?Hx of Renal cell cancer, right  ?Neurogenic bladder  ?P:  ?Check UA  ?Intermittent bladder scans as needed  ?Frequent peri care  ? ?Best Practice (right click and "Reselect all SmartList Selections" daily)  ? ?Diet/type: NPO ?DVT prophylaxis: SCD ?GI prophylaxis: PPI ?Lines: N/A ?Foley:  N/A ?Code Status:  full code ?Last date of multidisciplinary goals of care discussion: Pending  family discussion  ? ?Labs   ?CBC: ?Recent Labs  ?Lab 02/11/22 ?1207  ?WBC 17.6*  ?NEUTROABS 16.0*  ?HGB 9.3*  ?HCT 29.6*  ?MCV 96.7  ?PLT 255  ? ? ?Basic Metabolic Panel: ?Recent Labs  ?Lab 02/11/22 ?1207  ?NA 131*  ?K 3.9   ?CL 96*  ?CO2 27  ?GLUCOSE 143*  ?BUN 27*  ?CREATININE 1.05  ?CALCIUM 7.8*  ? ?GFR: ?Estimated Creatinine Clearance: 57.3 mL/min (by C-G formula based on SCr of 1.05 mg/dL). ?Recent Labs  ?Lab 02/11/22 ?1207  ?WBC 17.6*  ?LATICACIDVEN 1.7  ? ? ?Liver Function Tests: ?Recent Labs  ?Lab 02/11/22 ?1207  ?AST 27  ?ALT 16  ?ALKPHOS 60  ?BILITOT 0.4  ?PROT 6.2*  ?ALBUMIN 2.3*  ? ?No results for input(s): LIPASE, AMYLASE in the last 168 hours. ?No results for input(s): AMMONIA in the last 168 hours. ? ?ABG ?   ?Component Value Date/Time  ? PHART 7.43 02/11/2022 1218  ? PCO2ART 46 02/11/2022 1218  ? PO2ART 143 (H) 02/11/2022 1218  ? HCO3 30.5 (H) 02/11/2022 1218  ? O2SAT 99.7 02/11/2022 1218  ?  ? ?Coagulation Profile: ?Recent Labs  ?Lab 02/11/22 ?1207  ?INR 1.6*  ? ? ?Cardiac Enzymes: ?No results for input(s): CKTOTAL, CKMB, CKMBINDEX, TROPONINI in the last 168 hours. ? ?HbA1C: ?Hgb A1c MFr Bld  ?Date/Time Value Ref Range Status  ?05/26/2018 04:11 AM 5.1 4.8 - 5.6 % Final  ?  Comment:  ?  (NOTE) ?Pre diabetes:          5.7%-6.4% ?Diabetes:              >6.4% ?Glycemic control for   <7.0% ?adults with diabetes ?  ?04/03/2017 02:03 AM 5.0 4.8 - 5.6 % Final  ?  Comment:  ?  (NOTE) ?        Pre-diabetes: 5.7 - 6.4 ?        Diabetes: >6.4 ?        Glycemic control for adults with diabetes: <7.0 ?  ? ? ?CBG: ?No results for input(s): GLUCAP in the last 168 hours. ? ?Review of Systems:   ?Please see the history of present illness. All other systems reviewed and are negative  ? ?Past Medical History:  ?He,  has a past medical history of (HFpEF) heart failure with preserved ejection fraction ([place]), Allergy, Anemia, Anxiety, Arthritis, Asthma, Basal cell carcinoma, CAD (coronary artery disease), CHF (congestive heart failure) ([place]), Chronic kidney disease, Chronic sinusitis, Collagen vascular disease ([place]), Depression, Diverticulitis, Dysrhythmia, ED (erectile dysfunction), GERD (gastroesophageal reflux disease), History of SIADH,  adenomatous colonic polyps, Hypertension, Hyponatremia, IBS (irritable bowel syndrome), Neurodermatitis, Neurogenic bladder, OSA (obstructive sleep apnea), Persistent atrial fibrillation ([place]), Prostate cancer ([place]), and Renal mass.  ? ?Surgical History:  ? ?Past Surgical History:  ?Procedure Laterality Date  ? CARDIOVERSION N/A 04/23/2019  ? Procedure: CARDIOVERSION;  Surgeon: Nelva Bush, MD;  Location: [facility] ORS;  Service: Cardiovascular;  Laterality: N/A;  ? KNEE ARTHROPLASTY Right 09/02/2015  ? Procedure: COMPUTER ASSISTED TOTAL KNEE ARTHROPLASTY;  Surgeon: [name], MD;  Location: [facility] ORS;  Service: Orthopedics;  Laterality: Right;  ? LAPAROSCOPIC NEPHRECTOMY, HAND ASSISTED Right 06/17/2019  ? Procedure: HAND ASSISTED LAPAROSCOPIC NEPHRECTOMY;  Surgeon: Hollice Espy, MD;  Location: [facility] ORS;  Service: Urology;  Laterality: Right;  ? NASAL SINUS SURGERY  2009  ? DEVIATED SEPTUM AND POLYPS  ? permanent indwelling catheter    ? PROSTATE SURGERY    ? PROSTATECTOMY  ? TOTAL KNEE ARTHROPLASTY  Left 9/15  ? Dr [name]  ? URETHRAL STRICTURE DILATATION  02-2010  ? Dr.Cope  ?  ? ?Social History:  ? reports that he has never smoked. He has never used smokeless tobacco. He reports current alcohol use. He reports that he does not use drugs.  ? ?Family History:  ?His family history includes Heart disease (age of onset: 8) in his mother; Heart failure in his mother; Pneumonia (age of onset: 51) in his father; Skin cancer in his father, sister, and son. There is no history of Colon cancer, Esophageal cancer, Stomach cancer, Pancreatic cancer, or Liver disease.  ? ?Allergies ?Allergies  ?Allergen Reactions  ? Ciprofloxacin Nausea And Vomiting  ?  Headache  ? Citalopram Hydrobromide Other (See Comments)  ?  Citalopram and Hydrobromide listed as separate allergies on MAR. Unknown reaction  ? Clindamycin Nausea And Vomiting  ? Lorazepam Other (See Comments)  ?  Adverse reaction  ? Paroxetine Nausea Only  ? Ramipril Other  (See Comments)  ?  "unknown"  ? Simvastatin   ? Sulfa Antibiotics Other (See Comments)  ?  ? ?Home Medications  ?Prior to Admission medications   ?Medication Sig Start Date End Date Taking? Authorizing Pro

## 2022-02-11 NOTE — ED Provider Notes (Addendum)
?[facility] DEPT ?Provider Note ? ? ?CSN: [id number] ?Arrival date & time: 02/11/22  1146 ? ?  ? ?History ? ?Chief Complaint  ?Patient presents with  ? Aspiration  ? ? ?[name] is a 88 y.o. male. ? ?88 yo M with a chief complaints of acute shortness of breath.  Per EMS the patient was eating applesauce and choked on its own and became very tachypneic.  Initially found by the fire department to have an oxygen saturation in the 40s.  Improved with nonrebreather up into the 70s and then by the time he arrived here now in the 90s.  He was not feeling well over the past couple days per EMS. ? ?Patient does endorse not feeling well.  Tells me he has been coughing quite a bit.  Tells me that the skilled nursing facility tends to overfeed him the applesauce and makes it hard to swallow. ? ? ? ?  ? ?Home Medications ?Prior to Admission medications   ?Medication Sig Start Date End Date Taking? Authorizing Provider  ?acetaminophen (TYLENOL) 500 MG tablet Take 500 mg by mouth 3 (three) times daily as needed for mild pain or moderate pain.   Yes [provider]  ?albuterol (VENTOLIN HFA) 108 (90 Base) MCG/ACT inhaler Inhale 2 puffs into the lungs every 6 (six) hours as needed for wheezing or shortness of breath. 10/07/21  Yes Val Riles, MD  ?ALPRAZolam Duanne Moron) 0.5 MG tablet Take 1 tablet (0.5 mg total) by mouth at bedtime. 10/07/21  Yes Val Riles, MD  ?apixaban (ELIQUIS) 5 MG TABS tablet Take 1 tablet (5 mg total) by mouth 2 (two) times daily. 02/02/22  Yes Furth, Cadence H, PA-C  ?cetirizine (ZYRTEC) 5 MG tablet Take 5 mg by mouth at bedtime.   Yes [provider]  ?chlorhexidine (PERIDEX) 0.12 % solution Use as directed 15 mLs in the mouth or throat daily as needed (oral health).   Yes [provider]  ?Cholecalciferol (VITAMIN D3) 50 MCG (2000 UT) TABS Take 2,000 Units by mouth daily.    Yes [provider]  ?diclofenac Sodium (VOLTAREN) 1 % GEL Apply  2 g topically in the morning, at noon, and at bedtime. To right knee 01/26/22  Yes [provider]  ?EPINEPHrine 0.3 mg/0.3 mL IJ SOAJ injection Inject 0.3 mg into the muscle as needed for anaphylaxis.   Yes [provider]  ?fluticasone (FLONASE) 50 MCG/ACT nasal spray Place 2 sprays into both nostrils 2 (two) times daily. 04/27/17  Yes [name], MD  ?folic acid (FOLVITE) 1 MG tablet Take 1 mg by mouth daily.   Yes [provider]  ?furosemide (LASIX) 40 MG tablet Take 1 tablet (40 mg total) by mouth daily. Skip dose if SBP less than 110 mmHg 01/24/22  Yes Furth, Cadence H, PA-C  ?gabapentin (NEURONTIN) 600 MG tablet Take 600 mg by mouth in the morning and at bedtime.   Yes [provider]  ?hydroxychloroquine (PLAQUENIL) 200 MG tablet Take 200 mg by mouth 2 (two) times daily.  01/22/16  Yes [provider]  ?iron polysaccharides (NIFEREX) 150 MG capsule Take 150 mg by mouth daily.   Yes [provider]  ?melatonin 3 MG TABS tablet Take 6 mg by mouth at bedtime.   Yes [provider]  ?methotrexate (RHEUMATREX) 2.5 MG tablet Take 25 mg by mouth every Wednesday.   Yes [provider]  ?metoprolol succinate (TOPROL-XL) 25 MG 24 hr tablet Take 25 mg  by mouth daily.   Yes [provider]  ?midodrine (PROAMATINE) 2.5 MG tablet Take 2.5 mg by mouth 3 (three) times daily with meals.   Yes [provider]  ?Multiple Vitamin (MULTIVITAMIN) tablet Take 1 tablet by mouth daily. 12/16/21  Yes Val Riles, MD  ?ondansetron (ZOFRAN-ODT) 4 MG disintegrating tablet Take 4 mg by mouth every 6 (six) hours as needed for nausea or vomiting.   Yes [provider]  ?polyethylene glycol powder (GLYCOLAX/MIRALAX) 17 GM/SCOOP powder Take 17 g by mouth daily as needed for mild constipation.   Yes [provider]  ?pravastatin (PRAVACHOL) 20 MG tablet TAKE 1 TABLET(20 MG) BY MOUTH AT BEDTIME ?Patient taking differently: Take 20 mg  by mouth at bedtime. 01/18/21  Yes [name], MD  ?predniSONE (DELTASONE) 10 MG tablet Take 10 mg by mouth daily. For 7 days 02/10/22  Yes [provider]  ?Probiotic Product (ALIGN) 4 MG CAPS Take 1 capsule (4 mg total) by mouth daily. 12/15/16  Yes Gouru, Illene Silver, MD  ?traZODone (DESYREL) 50 MG tablet Take 50 mg by mouth at bedtime.   Yes [provider]  ?triamcinolone ointment (KENALOG) 0.1 % Apply 1 application. topically daily as needed (dermatitis). 01/26/22  Yes [provider]  ?Vibegron (GEMTESA) 75 MG TABS Take 75 mg by mouth daily.   Yes [provider]  ?vitamin B-12 (CYANOCOBALAMIN) 1000 MCG tablet Take 1,000 mcg by mouth daily.   Yes [provider]  ?vitamin C (ASCORBIC ACID) 500 MG tablet Take 1,000 mg by mouth daily.   Yes [provider]  ?Zinc 50 MG TABS Take 50 mg by mouth in the morning and at bedtime.   Yes [provider]  ?midodrine (PROAMATINE) 5 MG tablet Take 1 tablet (5 mg total) by mouth 3 (three) times daily with meals. Skip dose if SBP greater than 110 and or HR greater than 100 ?Patient not taking: Reported on 02/11/2022 01/24/22   Kathlen Mody, Cadence H, PA-C  ?   ? ?Allergies    ?Ciprofloxacin, Citalopram hydrobromide, Clindamycin, Lorazepam, Paroxetine, Ramipril, Simvastatin, and Sulfa antibiotics   ? ?Review of Systems   ?Review of Systems ? ?Physical Exam ?Updated Vital Signs ?BP (!) 102/45   Pulse 97   Temp (!) 103.7 ?F (39.8 ?C) (Rectal)   Resp (!) 37   Ht '5\' 9"'$  (1.753 m)   Wt 90.7 kg   SpO2 99%   BMI 29.53 kg/m?  ?Physical Exam ?Vitals and nursing note reviewed.  ?Constitutional:   ?   Appearance: He is well-developed.  ?HENT:  ?   Head: Normocephalic and atraumatic.  ?Eyes:  ?   Pupils: Pupils are equal, round, and reactive to light.  ?Neck:  ?   Vascular: No JVD.  ?Cardiovascular:  ?   Rate and Rhythm: Normal rate and regular rhythm.  ?   Heart sounds: No murmur heard. ?  No friction rub. No gallop.  ?Pulmonary:   ?   Effort: No respiratory distress.  ?   Breath sounds: No wheezing.  ?   Comments: Coarse breath sounds in all fields tachypnea ?Abdominal:  ?   General: There is no distension.  ?   Tenderness: There is no abdominal tenderness. There is no guarding or rebound.  ?Musculoskeletal:     ?   General: Normal range of motion.  ?   Cervical back: Normal range of motion and neck supple.  ?Skin: ?   Coloration: Skin is not pale.  ?  Findings: No rash.  ?Neurological:  ?   Mental Status: He is alert and oriented to person, place, and time.  ?Psychiatric:     ?   Behavior: Behavior normal.  ? ? ?ED Results / Procedures / Treatments   ?Labs ?(all labs ordered are listed, but only abnormal results are displayed) ?Labs Reviewed  ?COMPREHENSIVE METABOLIC PANEL - Abnormal; Notable for the following components:  ?    Result Value  ? Sodium 131 (*)   ? Chloride 96 (*)   ? Glucose, Bld 143 (*)   ? BUN 27 (*)   ? Calcium 7.8 (*)   ? Total Protein 6.2 (*)   ? Albumin 2.3 (*)   ? All other components within normal limits  ?CBC WITH DIFFERENTIAL/PLATELET - Abnormal; Notable for the following components:  ? WBC 17.6 (*)   ? RBC 3.06 (*)   ? Hemoglobin 9.3 (*)   ? HCT 29.6 (*)   ? Neutro Abs 16.0 (*)   ? Lymphs Abs 0.6 (*)   ? Abs Immature Granulocytes 0.33 (*)   ? All other components within normal limits  ?PROTIME-INR - Abnormal; Notable for the following components:  ? Prothrombin Time 19.4 (*)   ? INR 1.6 (*)   ? All other components within normal limits  ?APTT - Abnormal; Notable for the following components:  ? aPTT 38 (*)   ? All other components within normal limits  ?BLOOD GAS, ARTERIAL - Abnormal; Notable for the following components:  ? pO2, Arterial 143 (*)   ? Bicarbonate 30.5 (*)   ? Acid-Base Excess 5.5 (*)   ? All other components within normal limits  ?CULTURE, BLOOD (ROUTINE X 2)  ?CULTURE, BLOOD (ROUTINE X 2)  ?URINE CULTURE  ?RESP PANEL BY RT-PCR (FLU A&B, COVID) ARPGX2  ?LACTIC ACID, PLASMA  ?LACTIC ACID, PLASMA   ?URINALYSIS, ROUTINE W REFLEX MICROSCOPIC  ? ? ?EKG ?EKG Interpretation ? ?Date/Time:  Friday February 11 2022 11:56:50 EDT ?Ventricular Rate:  114 ?PR Interval:  95 ?QRS Duration: 161 ?QT Interval:  373 ?QTC

## 2022-02-11 NOTE — ED Provider Notes (Signed)
?  Physical Exam  ?BP (!) 109/47   Pulse 94   Temp (!) 100.7 ?F (38.2 ?C) (Axillary)   Resp (!) 27   Ht '5\' 9"'$  (1.753 m)   Wt 90.7 kg   SpO2 99%   BMI 29.53 kg/m?  ? ?Physical Exam ? ?Procedures  ?Procedures ? ?ED Course / MDM  ?  ?Medical Decision Making ?Amount and/or Complexity of Data Reviewed ?Labs: ordered. ?Radiology: ordered. ?ECG/medicine tests: ordered. ? ?Risk ?OTC drugs. ?Prescription drug management. ?Decision regarding hospitalization. ? ? ?Assuming care of patient from Dr. Tyrone Nine. ?  ?Patient in the ED for aspiration. ?Workup thus far shows fevers, low BP, elevated WC. ? ?Concerning findings are as following - low BP. ?Has received 2.5+ liters of fluid on pressors now. IVAB given. ? ?ICU team to admit. ? ?Resp status: tachypnea, no distress. ? ? ?  ?Varney Biles, MD ?02/12/22 1516 ? ?

## 2022-02-12 DIAGNOSIS — J690 Pneumonitis due to inhalation of food and vomit: Secondary | ICD-10-CM | POA: Diagnosis not present

## 2022-02-12 LAB — BASIC METABOLIC PANEL
Anion gap: 8 (ref 5–15)
BUN: 28 mg/dL — ABNORMAL HIGH (ref 8–23)
CO2: 28 mmol/L (ref 22–32)
Calcium: 7.8 mg/dL — ABNORMAL LOW (ref 8.9–10.3)
Chloride: 100 mmol/L (ref 98–111)
Creatinine, Ser: 1.01 mg/dL (ref 0.61–1.24)
GFR, Estimated: >60 mL/min (ref >60)
Glucose, Bld: 85 mg/dL (ref 70–99)
Potassium: 3.9 mmol/L (ref 3.5–5.1)
Sodium: 136 mmol/L (ref 135–145)

## 2022-02-12 LAB — CBC
HCT: 27.2 % — ABNORMAL LOW (ref 39.0–52.0)
Hemoglobin: 8.4 g/dL — ABNORMAL LOW (ref 13.0–17.0)
MCH: 30.2 pg (ref 26.0–34.0)
MCHC: 30.9 g/dL (ref 30.0–36.0)
MCV: 97.8 fL (ref 80.0–100.0)
Platelets: 197 10*3/uL (ref 150–400)
RBC: 2.78 MIL/uL — ABNORMAL LOW (ref 4.22–5.81)
RDW: 15 % (ref 11.5–15.5)
WBC: 14.2 10*3/uL — ABNORMAL HIGH (ref 4.0–10.5)
nRBC: 0 % (ref 0.0–0.2)

## 2022-02-12 LAB — PHOSPHORUS: Phosphorus: 5.4 mg/dL — ABNORMAL HIGH (ref 2.5–4.6)

## 2022-02-12 LAB — MAGNESIUM: Magnesium: 2 mg/dL (ref 1.7–2.4)

## 2022-02-12 MED ORDER — FENTANYL CITRATE (PF) 100 MCG/2ML IJ SOLN
50.0000 ug | INTRAMUSCULAR | Status: DC | PRN
  Administered 2022-02-12: 50 ug via INTRAVENOUS
  Filled 2022-02-12: qty 2

## 2022-02-12 MED ORDER — ORAL CARE MOUTH RINSE
15.0000 mL | Freq: Two times a day (BID) | OROMUCOSAL | Status: DC
  Administered 2022-02-12 – 2022-02-16 (×8): 15 mL via OROMUCOSAL

## 2022-02-12 MED ORDER — CHLORHEXIDINE GLUCONATE CLOTH 2 % EX PADS
6.0000 | MEDICATED_PAD | Freq: Every day | CUTANEOUS | Status: DC
  Administered 2022-02-12 – 2022-02-16 (×4): 6 via TOPICAL

## 2022-02-12 NOTE — Progress Notes (Signed)
? ?NAME:  [name], MRN:  [id number], DOB:  Jun 29, 1934, LOS: 1 ?ADMISSION DATE:  02/11/2022, CONSULTATION DATE:  02/11/2022 ?REFERRING MD:  Dr. Tyrone Nine, CHIEF COMPLAINT: Sepsis secondary to aspiration pneumonia ? ?History of Present Illness:  ?[name] is a 88 year old male with past medical history significant for diastolic congestive heart failure, CAD, CKD, anxiety, GERD, hypertension, obstructive sleep apnea, prostate cancer, and depression who presented from local skilled nursing facility after suffering aspiration event.  Per report patient was eating applesauce and suffered aspiration event resulting in respiratory distress. ? ?Patient was placed on nonrebreather per EMS for oxygen saturations in the 70s and prior to arrival to ED oxygen saturations have improved to 90% additional abnormal vital signs included mild tachycardia, hypotension and eventual development of fever of 103.7.  Pertinent lab work included sodium 131, chloride 96, glucose 143, BUN 27, albumin 2.3, WBC 17.6, hemoglobin 9.3.  Concerns for sepsis and continued hypotension requiring initiation of vasopressors PCCM consulted for further management and admission ? ?Pertinent  Medical History  ?Diastolic congestive heart failure ?CAD ?CKD ?Anxiety ?GERD ?Hypertension ?OSA ?Prostate cancer ?Depression ? ?Significant Hospital Events: ?Including procedures, antibiotic start and stop dates in addition to other pertinent events   ?3/17 admitted for sepsis following aspiration event at SNF low-dose pressor started in ED ?3/18-remains on pressors ? ?Interim History / Subjective:  ?No overnight events ?No pain or discomfort ? ?Objective   ?Blood pressure (!) 125/37, pulse 83, temperature 98 ?F (36.7 ?C), temperature source Oral, resp. rate (!) 24, height '5\' 9"'$  (1.753 m), weight 90.7 kg, SpO2 96 %. ?   ?   ? ?Intake/Output Summary (Last 24 hours) at 02/12/2022 1042 ?Last data filed at 02/12/2022 1000 ?Gross per 24 hour  ?Intake 2562.36 ml  ?Output 500 ml   ?Net 2062.36 ml  ? ?Filed Weights  ? 02/11/22 1209 02/12/22 0349  ?Weight: 90.7 kg 90.7 kg  ? ? ?Examination: ?Elderly gentleman, does not appear to be in distress ?Moist oral mucosa ?Rhonchi with rales at the bases bilaterally ?S1-S2 appreciated ?Bowel sounds appreciated ?Peripheral edema ? ?Resolved Hospital Problem list   ? ? ?Assessment & Plan:  ? ?Sepsis in the setting of aspiration pneumonia ?-Multifocal infiltrate, leukocytosis, fever ?-On Unasyn ?-Requiring pressors ?-MAP greater than 60 ?-Monitor urine output ? ?History of diastolic congestive heart failure with moderate aortic stenosis ?-Echo showing 60 to 65% ejection fraction ?-Monitor input and output ? ?Aspiration pneumonia ?-Speech evaluation to assess swallowing ?-If able to swallow safely will resume home medications ? ?History of paroxysmal atrial fibrillation ?-Not on anticoagulation because of frailty ?-On Toprol-XL ? ?Hyponatremia ?-This is chronic ? ?Chronic anemia ?-Trend CBC ?-Transfuse per protocol ? ?History of renal cell cancer ?Neurogenic bladder ?-Intermittent bladder scans ? ?Best Practice (right click and "Reselect all SmartList Selections" daily)  ? ?Diet/type: NPO-will resume diet once evaluated by speech ?DVT prophylaxis: SCD ?GI prophylaxis: PPI ?Lines: N/A ?Foley:  N/A ?Code Status:  full code ?Last date of multidisciplinary goals of care discussion: Discussed with daughter 02/11/2022 ? ?Labs   ?CBC: ?Recent Labs  ?Lab 02/11/22 ?1207 02/11/22 ?1536 02/12/22 ?3559  ?WBC 17.6* 16.3* 14.2*  ?NEUTROABS 16.0*  --   --   ?HGB 9.3* 8.4* 8.4*  ?HCT 29.6* 26.7* 27.2*  ?MCV 96.7 98.2 97.8  ?PLT 255 232 197  ? ? ?Basic Metabolic Panel: ?Recent Labs  ?Lab 02/11/22 ?1207 02/12/22 ?7416  ?NA 131* 136  ?K 3.9 3.9  ?CL 96* 100  ?CO2 27 28  ?GLUCOSE  143* 85  ?BUN 27* 28*  ?CREATININE 1.05 1.01  ?CALCIUM 7.8* 7.8*  ?MG  --  2.0  ?PHOS  --  5.4*  ? ?GFR: ?Estimated Creatinine Clearance: 59.5 mL/min (by C-G formula based on SCr of 1.01  mg/dL). ?Recent Labs  ?Lab 02/11/22 ?1207 02/11/22 ?1407 02/11/22 ?1536 02/12/22 ?7858  ?WBC 17.6*  --  16.3* 14.2*  ?LATICACIDVEN 1.7 1.2  --   --   ? ? ?Liver Function Tests: ?Recent Labs  ?Lab 02/11/22 ?1207  ?AST 27  ?ALT 16  ?ALKPHOS 60  ?BILITOT 0.4  ?PROT 6.2*  ?ALBUMIN 2.3*  ? ?No results for input(s): LIPASE, AMYLASE in the last 168 hours. ?No results for input(s): AMMONIA in the last 168 hours. ? ?ABG ?   ?Component Value Date/Time  ? PHART 7.43 02/11/2022 1218  ? PCO2ART 46 02/11/2022 1218  ? PO2ART 143 (H) 02/11/2022 1218  ? HCO3 30.5 (H) 02/11/2022 1218  ? O2SAT 99.7 02/11/2022 1218  ?  ? ?Coagulation Profile: ?Recent Labs  ?Lab 02/11/22 ?1207  ?INR 1.6*  ? ? ?Cardiac Enzymes: ?No results for input(s): CKTOTAL, CKMB, CKMBINDEX, TROPONINI in the last 168 hours. ? ?HbA1C: ?Hgb A1c MFr Bld  ?Date/Time Value Ref Range Status  ?05/26/2018 04:11 AM 5.1 4.8 - 5.6 % Final  ?  Comment:  ?  (NOTE) ?Pre diabetes:          5.7%-6.4% ?Diabetes:              >6.4% ?Glycemic control for   <7.0% ?adults with diabetes ?  ?04/03/2017 02:03 AM 5.0 4.8 - 5.6 % Final  ?  Comment:  ?  (NOTE) ?        Pre-diabetes: 5.7 - 6.4 ?        Diabetes: >6.4 ?        Glycemic control for adults with diabetes: <7.0 ?  ? ? ?CBG: ?No results for input(s): GLUCAP in the last 168 hours. ? ?Review of Systems:   ?Please see the history of present illness. All other systems reviewed and are negative  ? ?Past Medical History:  ?He,  has a past medical history of (HFpEF) heart failure with preserved ejection fraction ([place]), Allergy, Anemia, Anxiety, Arthritis, Asthma, Basal cell carcinoma, CAD (coronary artery disease), CHF (congestive heart failure) ([place]), Chronic kidney disease, Chronic sinusitis, Collagen vascular disease ([place]), Depression, Diverticulitis, Dysrhythmia, ED (erectile dysfunction), GERD (gastroesophageal reflux disease), History of SIADH, adenomatous colonic polyps, Hypertension, Hyponatremia, IBS (irritable bowel syndrome),  Neurodermatitis, Neurogenic bladder, OSA (obstructive sleep apnea), Persistent atrial fibrillation ([place]), Prostate cancer ([place]), and Renal mass.  ? ?Surgical History:  ? ?Past Surgical History:  ?Procedure Laterality Date  ? CARDIOVERSION N/A 04/23/2019  ? Procedure: CARDIOVERSION;  Surgeon: Nelva Bush, MD;  Location: [facility] ORS;  Service: Cardiovascular;  Laterality: N/A;  ? KNEE ARTHROPLASTY Right 09/02/2015  ? Procedure: COMPUTER ASSISTED TOTAL KNEE ARTHROPLASTY;  Surgeon: [name], MD;  Location: [facility] ORS;  Service: Orthopedics;  Laterality: Right;  ? LAPAROSCOPIC NEPHRECTOMY, HAND ASSISTED Right 06/17/2019  ? Procedure: HAND ASSISTED LAPAROSCOPIC NEPHRECTOMY;  Surgeon: Hollice Espy, MD;  Location: [facility] ORS;  Service: Urology;  Laterality: Right;  ? NASAL SINUS SURGERY  2009  ? DEVIATED SEPTUM AND POLYPS  ? permanent indwelling catheter    ? PROSTATE SURGERY    ? PROSTATECTOMY  ? TOTAL KNEE ARTHROPLASTY Left 9/15  ? Dr [name]  ? URETHRAL STRICTURE DILATATION  02-2010  ? Dr.Cope  ?  ? ?Social History:  ?  reports that he has never smoked. He has never used smokeless tobacco. He reports current alcohol use. He reports that he does not use drugs.  ? ?Family History:  ?His family history includes Heart disease (age of onset: 62) in his mother; Heart failure in his mother; Pneumonia (age of onset: 57) in his father; Skin cancer in his father, sister, and son. There is no history of Colon cancer, Esophageal cancer, Stomach cancer, Pancreatic cancer, or Liver disease.  ? ?Allergies ?Allergies  ?Allergen Reactions  ? Ciprofloxacin Nausea And Vomiting  ?  Headache  ? Citalopram Hydrobromide Other (See Comments)  ?  Citalopram and Hydrobromide listed as separate allergies on MAR. Unknown reaction  ? Clindamycin Nausea And Vomiting  ? Lorazepam Other (See Comments)  ?  Adverse reaction  ? Paroxetine Nausea Only  ? Ramipril Other (See Comments)  ?  "unknown"  ? Simvastatin   ? Sulfa Antibiotics Other (See Comments)   ?   ?The patient is critically ill with multiple organ systems failure and requires high complexity decision making for assessment and support, frequent evaluation and titration of therapies, application of advanced

## 2022-02-12 NOTE — Progress Notes (Signed)
An USGPIV (ultrasound guided PIV) has been placed for short-term vasopressor infusion. A correctly placed ivWatch must be used when administering Vasopressors. Should this treatment be needed beyond 72 hours, central line access should be obtained.  It will be the responsibility of the bedside nurse to follow best practice to prevent extravasations.   ?

## 2022-02-12 NOTE — Evaluation (Signed)
Clinical/Bedside Swallow Evaluation ?Patient Details  ?Name: [name] ?MRN: [id number] ?Date of Birth: 18-Oct-1934 ? ?Today's Date: 02/12/2022 ?Time: SLP Start Time (ACUTE ONLY): 6720 SLP Stop Time (ACUTE ONLY): 1655 ?SLP Time Calculation (min) (ACUTE ONLY): 39 min ? ?Past Medical History:  ?Past Medical History:  ?Diagnosis Date  ? (HFpEF) heart failure with preserved ejection fraction ([place])   ? a. 02/2019 Echo: Ef 60-65%, mildly reduced RV fxn. RVSP 52mHg. Mildly dil LA. Mod dil RA. Mild to mod TR. Mild to mod AS. Triv AI.  ? Allergy   ? Anemia   ? Anxiety   ? Arthritis   ? rheumatoid  ? Asthma   ? Basal cell carcinoma   ? back  ? CAD (coronary artery disease)   ? CHF (congestive heart failure) ([place]   ? Chronic kidney disease   ? Mass right kidney  ? Chronic sinusitis   ? Collagen vascular disease ([place]   ? Rheumatoid Arthritis  ? Depression   ? Diverticulitis   ? Dysrhythmia   ? ED (erectile dysfunction)   ? GERD (gastroesophageal reflux disease)   ? History of SIADH   ? Hx of adenomatous colonic polyps   ? Hypertension   ? Hyponatremia   ? IBS (irritable bowel syndrome)   ? Neurodermatitis   ? Neurogenic bladder   ? OSA (obstructive sleep apnea)   ? no CPAP since weight loss  ? Persistent atrial fibrillation ([place]   ? a. Dx 02/2019; b. CHA2DS2VASc = 6-->eliquis initiated.  ? Prostate cancer ([place]   ? Renal mass   ? 10/2018 4.2cm R renal cortical mass. Most compatible w/ clear cell renal cell carcinoma.  ? ?Past Surgical History:  ?Past Surgical History:  ?Procedure Laterality Date  ? CARDIOVERSION N/A 04/23/2019  ? Procedure: CARDIOVERSION;  Surgeon: ENelva Bush MD;  Location: [facility] ORS;  Service: Cardiovascular;  Laterality: N/A;  ? KNEE ARTHROPLASTY Right 09/02/2015  ? Procedure: COMPUTER ASSISTED TOTAL KNEE ARTHROPLASTY;  Surgeon: JDereck Leep MD;  Location: [facility] ORS;  Service: Orthopedics;  Laterality: Right;  ? LAPAROSCOPIC NEPHRECTOMY, HAND ASSISTED Right 06/17/2019  ? Procedure: HAND ASSISTED  LAPAROSCOPIC NEPHRECTOMY;  Surgeon: BHollice Espy MD;  Location: [facility] ORS;  Service: Urology;  Laterality: Right;  ? NASAL SINUS SURGERY  2009  ? DEVIATED SEPTUM AND POLYPS  ? permanent indwelling catheter    ? PROSTATE SURGERY    ? PROSTATECTOMY  ? TOTAL KNEE ARTHROPLASTY Left 9/15  ? Dr HMarry Guan ? URETHRAL STRICTURE DILATATION  02-2010  ? Dr.Cope  ? ?HPI:  ?88year old male presented from local skilled nursing facility after suffering aspiration event.  Per report patient was eating applesauce and apparently aspirated, resulting in respiratory distress. Dx sepsis. Past medical history significant for diastolic congestive heart failure, CAD, CKD, anxiety, GERD, hypertension, obstructive sleep apnea, prostate cancer, and depression.  ?  ?Assessment / Plan / Recommendation  ?Clinical Impression ? Pt presents with a functional oropharyngeal swallow with no indications of a biomechanical dysphagia.  Oral mechanism exam was normal. Pt demonstrated thorough mastication, brisk swallow response, and no s/s of aspiration. He described occasional incidents of regurgitation, fullness in chest when eating, difficulty consuming large pills. For now, recommend resuming a regular diet with thin liquids and calling Dr. SFuller Plan his GI physician, to set up an appt after discharged to discuss his esophageal symptoms.  Mr. CDollinsand his son agreed with plan. No further SLP f/u is necessary - our service will sign off. ?SLP Visit Diagnosis:  Dysphagia, unspecified (R13.10) ?   ?Aspiration Risk ? No limitations  ?  ?Diet Recommendation   Regular solids, thin liquids ? ?Medication Administration: Whole meds with liquid (one at a time)  ?  ?Other  Recommendations Recommended Consults: Consider GI evaluation ?Oral Care Recommendations: Oral care BID   ? ?Recommendations for follow up therapy are one component of a multi-disciplinary discharge planning process, led by the attending physician.  Recommendations may be updated based on patient  status, additional functional criteria and insurance authorization. ? ?Follow up Recommendations No SLP follow up  ? ? ?  ? ? ?Swallow Study   ?General HPI: 88 year old male presented from local skilled nursing facility after suffering aspiration event.  Per report patient was eating applesauce and apparently aspirated, resulting in respiratory distress. Dx sepsis. Past medical history significant for diastolic congestive heart failure, CAD, CKD, anxiety, GERD, hypertension, obstructive sleep apnea, prostate cancer, and depression. ?Type of Study: Bedside Swallow Evaluation ?Previous Swallow Assessment: no ?Diet Prior to this Study: NPO ?Temperature Spikes Noted: No ?Respiratory Status: Nasal cannula ?History of Recent Intubation: No ?Behavior/Cognition: Alert;Cooperative;Pleasant mood ?Oral Cavity Assessment: Within Functional Limits ?Oral Care Completed by SLP: No ?Oral Cavity - Dentition: Adequate natural dentition ?Vision: Functional for self-feeding ?Self-Feeding Abilities: Able to feed self ?Patient Positioning: Upright in bed  ?  ?Oral/Motor/Sensory Function Overall Oral Motor/Sensory Function: Within functional limits   ?Ice Chips Ice chips: Within functional limits   ?Thin Liquid Thin Liquid: Within functional limits  ?  ?Nectar Thick Nectar Thick Liquid: Not tested   ?Honey Thick Honey Thick Liquid: Not tested   ?Puree Puree: Within functional limits   ?Solid ? ? ?  Solid: Within functional limits  ? ?  ? ?[name] ?02/12/2022,5:09 PM ?Estill Bamberg L. Lylian Sanag[place]in, MA CCC/SLP ?Acute Rehabilitation Services ?Office number [phone] ?Pager [phone] ? ? ? ?

## 2022-02-13 DIAGNOSIS — J690 Pneumonitis due to inhalation of food and vomit: Secondary | ICD-10-CM | POA: Diagnosis not present

## 2022-02-13 DIAGNOSIS — L8990 Pressure ulcer of unspecified site, unspecified stage: Secondary | ICD-10-CM | POA: Insufficient documentation

## 2022-02-13 MED ORDER — VITAMIN B-12 1000 MCG PO TABS
1000.0000 ug | ORAL_TABLET | Freq: Every day | ORAL | Status: DC
  Administered 2022-02-14 – 2022-02-16 (×3): 1000 ug via ORAL
  Filled 2022-02-13 (×3): qty 1

## 2022-02-13 MED ORDER — MUSCLE RUB 10-15 % EX CREA
TOPICAL_CREAM | CUTANEOUS | Status: AC | PRN
  Filled 2022-02-13: qty 85

## 2022-02-13 MED ORDER — ONDANSETRON 4 MG PO TBDP: 4.0000 mg | ORAL_TABLET | Freq: Four times a day (QID) | ORAL | Status: DC | PRN

## 2022-02-13 MED ORDER — ASCORBIC ACID 500 MG PO TABS
1000.0000 mg | ORAL_TABLET | Freq: Every day | ORAL | Status: DC
  Administered 2022-02-14 – 2022-02-16 (×3): 1000 mg via ORAL
  Filled 2022-02-13 (×3): qty 2

## 2022-02-13 MED ORDER — ACETAMINOPHEN 325 MG PO TABS
650.0000 mg | ORAL_TABLET | ORAL | Status: AC | PRN
  Administered 2022-02-13 – 2022-02-15 (×4): 650 mg via ORAL
  Filled 2022-02-13 (×4): qty 2

## 2022-02-13 MED ORDER — TRAZODONE HCL 50 MG PO TABS
50.0000 mg | ORAL_TABLET | Freq: Every day | ORAL | Status: DC
  Administered 2022-02-13 – 2022-02-15 (×3): 50 mg via ORAL
  Filled 2022-02-13 (×3): qty 1

## 2022-02-13 MED ORDER — ZINC SULFATE 220 (50 ZN) MG PO CAPS
220.0000 mg | ORAL_CAPSULE | Freq: Every day | ORAL | Status: DC
  Administered 2022-02-14 – 2022-02-16 (×3): 220 mg via ORAL
  Filled 2022-02-13 (×3): qty 1

## 2022-02-13 MED ORDER — METHOTREXATE 2.5 MG PO TABS: 25.0000 mg | ORAL_TABLET | ORAL | Status: DC

## 2022-02-13 MED ORDER — FOLIC ACID 1 MG PO TABS
1.0000 mg | ORAL_TABLET | Freq: Every day | ORAL | Status: DC
Start: 2022-02-14 — End: 2022-02-16
  Administered 2022-02-14 – 2022-02-16 (×3): 1 mg via ORAL
  Filled 2022-02-13 (×3): qty 1

## 2022-02-13 MED ORDER — GABAPENTIN 300 MG PO CAPS
600.0000 mg | ORAL_CAPSULE | Freq: Two times a day (BID) | ORAL | Status: DC
  Administered 2022-02-14 – 2022-02-16 (×4): 600 mg via ORAL
  Filled 2022-02-13 (×7): qty 2

## 2022-02-13 MED ORDER — MELATONIN 3 MG PO TABS
6.0000 mg | ORAL_TABLET | Freq: Every day | ORAL | Status: DC
  Administered 2022-02-13 – 2022-02-15 (×3): 6 mg via ORAL
  Filled 2022-02-13 (×3): qty 2

## 2022-02-13 MED ORDER — VIBEGRON 75 MG PO TABS: 75.0000 mg | ORAL_TABLET | Freq: Every day | ORAL | Status: DC

## 2022-02-13 MED ORDER — POLYSACCHARIDE IRON COMPLEX 150 MG PO CAPS
150.0000 mg | ORAL_CAPSULE | Freq: Every day | ORAL | Status: DC
  Administered 2022-02-14 – 2022-02-16 (×3): 150 mg via ORAL
  Filled 2022-02-13 (×3): qty 1

## 2022-02-13 MED ORDER — SACCHAROMYCES BOULARDII 250 MG PO CAPS
250.0000 mg | ORAL_CAPSULE | Freq: Every day | ORAL | Status: DC
Start: 2022-02-14 — End: 2022-02-16
  Administered 2022-02-14 – 2022-02-16 (×3): 250 mg via ORAL
  Filled 2022-02-13 (×3): qty 1

## 2022-02-13 MED ORDER — EPINEPHRINE 0.3 MG/0.3ML IJ SOAJ: 0.3000 mg | INTRAMUSCULAR | Status: DC | PRN

## 2022-02-13 MED ORDER — VITAMIN D3 25 MCG (1000 UNIT) PO TABS
2000.0000 [IU] | ORAL_TABLET | Freq: Every day | ORAL | Status: DC
  Administered 2022-02-14 – 2022-02-16 (×3): 2000 [IU] via ORAL
  Filled 2022-02-13 (×3): qty 2

## 2022-02-13 MED ORDER — METOPROLOL SUCCINATE ER 25 MG PO TB24
25.0000 mg | ORAL_TABLET | Freq: Every day | ORAL | Status: DC
  Administered 2022-02-13 – 2022-02-15 (×3): 25 mg via ORAL
  Filled 2022-02-13 (×4): qty 1

## 2022-02-13 MED ORDER — MIDODRINE HCL 2.5 MG PO TABS
2.5000 mg | ORAL_TABLET | Freq: Three times a day (TID) | ORAL | Status: DC
  Administered 2022-02-13 – 2022-02-16 (×7): 2.5 mg via ORAL
  Filled 2022-02-13 (×9): qty 1

## 2022-02-13 MED ORDER — AMOXICILLIN-POT CLAVULANATE 875-125 MG PO TABS
1.0000 | ORAL_TABLET | Freq: Two times a day (BID) | ORAL | Status: DC
  Administered 2022-02-13 – 2022-02-16 (×7): 1 via ORAL
  Filled 2022-02-13 (×7): qty 1

## 2022-02-13 MED ORDER — LORATADINE 10 MG PO TABS
10.0000 mg | ORAL_TABLET | Freq: Every day | ORAL | Status: DC
  Administered 2022-02-13 – 2022-02-16 (×4): 10 mg via ORAL
  Filled 2022-02-13 (×5): qty 1

## 2022-02-13 MED ORDER — PREDNISONE 5 MG PO TABS
5.0000 mg | ORAL_TABLET | Freq: Every day | ORAL | Status: DC
  Administered 2022-02-13 – 2022-02-16 (×4): 5 mg via ORAL
  Filled 2022-02-13 (×4): qty 1

## 2022-02-13 MED ORDER — CHLORHEXIDINE GLUCONATE 0.12 % MT SOLN: 15.0000 mL | Freq: Every day | OROMUCOSAL | Status: DC | PRN

## 2022-02-13 MED ORDER — DICLOFENAC SODIUM 1 % EX GEL
2.0000 g | Freq: Four times a day (QID) | CUTANEOUS | Status: DC
  Administered 2022-02-13 – 2022-02-16 (×13): 2 g via TOPICAL
  Filled 2022-02-13: qty 100

## 2022-02-13 MED ORDER — FUROSEMIDE 40 MG PO TABS
40.0000 mg | ORAL_TABLET | Freq: Every day | ORAL | Status: DC
  Administered 2022-02-13 – 2022-02-16 (×4): 40 mg via ORAL
  Filled 2022-02-13 (×4): qty 1

## 2022-02-13 MED ORDER — POLYETHYLENE GLYCOL 3350 17 G PO PACK: 17.0000 g | PACK | Freq: Every day | ORAL | Status: DC | PRN

## 2022-02-13 MED ORDER — APIXABAN 5 MG PO TABS
5.0000 mg | ORAL_TABLET | Freq: Two times a day (BID) | ORAL | Status: DC
  Administered 2022-02-13 – 2022-02-16 (×7): 5 mg via ORAL
  Filled 2022-02-13 (×7): qty 1

## 2022-02-13 MED ORDER — ADULT MULTIVITAMIN W/MINERALS CH
1.0000 | ORAL_TABLET | Freq: Every day | ORAL | Status: DC
  Administered 2022-02-14 – 2022-02-16 (×3): 1 via ORAL
  Filled 2022-02-13 (×3): qty 1

## 2022-02-13 MED ORDER — MELATONIN 5 MG PO TABS
5.0000 mg | ORAL_TABLET | Freq: Every evening | ORAL | Status: AC | PRN
  Administered 2022-02-13: 5 mg via ORAL
  Filled 2022-02-13: qty 1

## 2022-02-13 MED ORDER — ALPRAZOLAM 0.5 MG PO TABS
0.5000 mg | ORAL_TABLET | Freq: Every day | ORAL | Status: DC
  Administered 2022-02-13 – 2022-02-15 (×3): 0.5 mg via ORAL
  Filled 2022-02-13 (×3): qty 1

## 2022-02-13 MED ORDER — PRAVASTATIN SODIUM 20 MG PO TABS
20.0000 mg | ORAL_TABLET | Freq: Every day | ORAL | Status: DC
Start: 2022-02-13 — End: 2022-02-16
  Administered 2022-02-13 – 2022-02-15 (×3): 20 mg via ORAL
  Filled 2022-02-13 (×3): qty 1

## 2022-02-13 MED ORDER — ACETAMINOPHEN 500 MG PO TABS: 500.0000 mg | ORAL_TABLET | Freq: Three times a day (TID) | ORAL | Status: DC | PRN

## 2022-02-13 MED ORDER — HYDROXYCHLOROQUINE SULFATE 200 MG PO TABS
200.0000 mg | ORAL_TABLET | Freq: Two times a day (BID) | ORAL | Status: DC
  Administered 2022-02-13 – 2022-02-16 (×7): 200 mg via ORAL
  Filled 2022-02-13 (×8): qty 1

## 2022-02-13 MED ORDER — ALBUTEROL SULFATE (2.5 MG/3ML) 0.083% IN NEBU: 3.0000 mL | INHALATION_SOLUTION | Freq: Four times a day (QID) | RESPIRATORY_TRACT | Status: DC | PRN

## 2022-02-13 NOTE — TOC Initial Note (Signed)
Transition of Care (TOC) - Initial/Assessment Note  ? ? ?Patient Details  ?Name: [name] ?MRN: [id number] ?Date of Birth: [birth year] ? ?Transition of Care (TOC) CM/SW Contact:    ?Tawanna Cooler, RN ?Phone Number: ?02/13/2022, 10:22 AM ? ?Clinical Narrative:                 ? ?Per notes, patient from [facility].  Has had multiple hospital and rehab admissions in the last few months.  TOC CM following for discharge needs.  ? ?Expected Discharge Plan: [place] ?Barriers to Discharge: Continued Medical Work up ? ? ?Expected Discharge Plan and Services ?Expected Discharge Plan: [place] ?  ?   ? ?Prior Living Arrangements/Services ?  ?Lives with:: Other (Comment) (Assisted living) ?Patient language and need for interpreter reviewed:: Yes ?       ?Need for Family Participation in Patient Care: Yes (Comment) ?Care giver support system in place?: Yes (comment) ?  ?Criminal Activity/Legal Involvement Pertinent to Current Situation/Hospitalization: No - Comment as needed ? ? ?  ?Alcohol / Substance Use: Not Applicable ?Psych Involvement: No (comment) ? ?Admission diagnosis:  Sepsis ([place]) [A41.9] ?Aspiration into airway, initial encounter [T17.908A] ?Patient Active Problem List  ? Diagnosis Date Noted  ? Pressure injury of skin 02/13/2022  ? Sepsis ([place]) 02/11/2022  ? CAP (community acquired pneumonia) 12/11/2021  ? Acute heart failure with preserved ejection fraction ([place])   ? Atrial fibrillation ([place])   ? Iron deficiency anemia   ? Decubitus ulcer of left buttock, stage 2 ([place]) 12/03/2021  ? Asthma exacerbation 10/05/2021  ? PAF (paroxysmal atrial fibrillation) ([place]) 10/05/2021  ? Sacral decubitus ulcer, stage II ([place]) 10/05/2021  ? E coli bacteremia   ? Acute kidney injury superimposed on CKD ([place]) 09/11/2021  ? Hypotension   ? Thrombocytopenia ([place])   ? Acute on chronic diastolic heart failure ([place])   ? Generalized weakness   ? Neuropathy   ? Severe persistent asthma  02/05/2021  ? Spondylosis without myelopathy or radiculopathy, lumbosacral region 11/24/2020  ? Elevated C-reactive protein (CRP) 11/24/2020  ? Elevated sed rate 11/24/2020  ? Chronic pain syndrome 11/04/2020  ? Disorder of skeletal system 11/04/2020  ? Physical deconditioning 11/04/2020  ? Abnormal MRI, lumbar spine (09/21/2020) 11/04/2020  ? Chronic low back pain (Bilateral) w/o sciatica 11/04/2020  ? DDD (degenerative disc disease), lumbosacral 11/04/2020  ? Lumbosacral foraminal stenosis 11/04/2020  ? Lumbar facet hypertrophy 11/04/2020  ? Levoscoliosis of lumbosacral spine 11/04/2020  ? Grade 1 Retrolisthesis at L2-3 and L3-4 11/04/2020  ? Chronic anticoagulation (Eliquis) 11/04/2020  ? Lumbar facet syndrome 11/04/2020  ? Long term prescription benzodiazepine use 11/04/2020  ? Chronic shoulder pain (Bilateral) 11/04/2020  ? Chronic elbow pain (Bilateral) 11/04/2020  ? Chronic wrist pain (Bilateral) 11/04/2020  ? Chronic sinusitis 07/12/2020  ? Persistent atrial fibrillation ([place]) 07/12/2020  ? Pancytopenia ([place]) 09/12/2019  ? Edema of lower extremity 09/12/2019  ? Hyposmolality and/or hyponatremia 09/12/2019  ? Renal cell carcinoma ([place]) 07/11/2019  ? AF (paroxysmal atrial fibrillation) ([place]) 05/09/2019  ? History of TIA (transient ischemic attack) 12/06/2018  ? Vision changes 12/06/2018  ? TIA (transient ischemic attack) 06/06/2018  ? Carotid artery disease ([place]) 06/06/2018  ? Long term current use of immunosuppressive drug 04/24/2018  ? Obesity (BMI 30-39.9) 04/24/2018  ? SIADH (syndrome of inappropriate ADH production) ([place]) 04/17/2017  ? AKI (acute kidney injury) ([place]) 04/03/2017  ? Hyponatremia 01/16/2017  ? Urinary tract infection associated with indwelling urethral catheter (  [place]) 12/19/2016  ? Asthma with acute exacerbation 11/14/2016  ? Mild intermittent asthma without complication 34/28/7681  ? Mood disorder ([place]) 01/18/2016  ? Presence of indwelling urinary catheter 08/20/2015  ? Advanced directives,  counseling/discussion 07/28/2014  ? Routine general medical examination at a health care facility 04/09/2012  ? Venous stasis dermatitis 06/13/2011  ? Sleep disturbance 12/21/2010  ? CONSTIPATION, CHRONIC 12/07/2010  ? Chronic constipation 12/07/2010  ? Hyperlipidemia 12/02/2010  ? OSTEOARTHRITIS 12/02/2010  ? Neurogenic bladder 05/24/2010  ? IBS 05/05/2010  ? History of colonic polyps 05/05/2010  ? NEUROPATHY 05/03/2010  ? Mononeuritis 05/03/2010  ? Personal history of prostate cancer 03/04/2010  ? Benign essential hypertension 03/25/2009  ? GAD (generalized anxiety disorder) 01/27/2007  ? Coronary atherosclerosis of native coronary artery 01/27/2007  ? Allergic asthma 01/27/2007  ? GERD 01/27/2007  ? Chronic rheumatic arthritis ([place]) 01/27/2007  ? Rheumatoid arthritis involving multiple sites with positive rheumatoid factor ([place]) 01/27/2007  ? ?PCP:  [name], MD ?Pharmacy:  No Pharmacies Listed ? ? ? ?Readmission Risk Interventions ?Readmission Risk Prevention Plan 02/13/2022 09/12/2021 06/23/2019  ?Transportation Screening Complete Complete Complete  ?PCP or Specialist Appt within 3-5 Days - - Complete  ?[place] or Home Care Consult - - Complete  ?Social Work Consult for [place] Planning/Counseling - - Complete  ?Palliative Care Screening - - Not Applicable  ?Medication Review Press photographer) Complete Complete Complete  ?PCP or Specialist appointment within 3-5 days of discharge Complete Complete -  ?[place] or Home Care Consult Complete Complete -  ?SW Recovery Care/Counseling Consult Complete Complete -  ?Palliative Care Screening Complete Not Applicable -  ?Skilled Nursing Facility Complete Complete -  ?Some recent data might be hidden  ? ? ? ?

## 2022-02-13 NOTE — Progress Notes (Signed)
? ?NAME:  [name], MRN:  [id number], DOB:  08/15/1934, LOS: 2 ?ADMISSION DATE:  02/11/2022, CONSULTATION DATE:  02/11/2022 ?REFERRING MD:  Dr. Tyrone Nine, CHIEF COMPLAINT: Sepsis secondary to aspiration pneumonia ? ?History of Present Illness:  ?[name] is a 88 year old male with past medical history significant for diastolic congestive heart failure, CAD, CKD, anxiety, GERD, hypertension, obstructive sleep apnea, prostate cancer, and depression who presented from local skilled nursing facility after suffering aspiration event.  Per report patient was eating applesauce and suffered aspiration event resulting in respiratory distress. ? ?Patient was placed on nonrebreather per EMS for oxygen saturations in the 70s and prior to arrival to ED oxygen saturations have improved to 90% additional abnormal vital signs included mild tachycardia, hypotension and eventual development of fever of 103.7.  Pertinent lab work included sodium 131, chloride 96, glucose 143, BUN 27, albumin 2.3, WBC 17.6, hemoglobin 9.3.  Concerns for sepsis and continued hypotension requiring initiation of vasopressors PCCM consulted for further management and admission ? ?Pertinent  Medical History  ?Diastolic congestive heart failure ?CAD ?CKD ?Anxiety ?GERD ?Hypertension ?OSA ?Prostate cancer ?Depression ? ?Significant Hospital Events: ?Including procedures, antibiotic start and stop dates in addition to other pertinent events   ?3/17 admitted for sepsis following aspiration event at SNF low-dose pressor started in ED ?3/18-remains on pressors ?3/19 off pressors, passed swallowing ? ?Interim History / Subjective:  ?No overnight events ?Awake and alert and interactive ?Tolerating orally ?Denies any pain or discomfort ? ?Objective   ?Blood pressure (!) 144/28, pulse 88, temperature 97.9 ?F (36.6 ?C), temperature source Axillary, resp. rate (!) 21, height '5\' 9"'$  (1.753 m), weight 90.5 kg, SpO2 96 %. ?   ?   ? ?Intake/Output Summary (Last 24 hours) at  02/13/2022 1032 ?Last data filed at 02/13/2022 0320 ?Gross per 24 hour  ?Intake 526.45 ml  ?Output 925 ml  ?Net -398.55 ml  ? ?Filed Weights  ? 02/11/22 1209 02/12/22 0349 02/13/22 0500  ?Weight: 90.7 kg 90.7 kg 90.5 kg  ? ? ?Examination: ?Elderly gentleman, does not appear to be in distress, sitting upright eating ?Moist oral mucosa ?Rhonchi and rales at the bases bilaterally ?S1-S2 appreciated ?Bowel sounds appreciated ? ?Resolved Hospital Problem list   ? ? ?Assessment & Plan:  ? ?Sepsis in the setting of aspiration pneumonia ?-Multifocal infiltrate, leukocytosis, fever ?-Was on Unasyn, will switch to Augmentin orally as he is tolerating orally ?-Weaned off pressors ?-Usually on midodrine at home which will be reinitiated ? ?History of diastolic congestive heart failure with moderate aortic stenosis ?-Recent echo with ejection fraction of 60 to 65% ?-Continue to monitor ? ?Aspiration pneumonia ?-Passed swallowing evaluation ?-Tolerating orally ?-We will initiate home medications ?-Leukocytosis improving, on room air not complaining of any shortness of breath at present. ? ?History of paroxysmal atrial fibrillation ?CHA2DS2-VASc score of 5 ?-Was off anticoagulation previously ?-Recently was reinitiated on Eliquis-we will resume ?-Resume Toprol ? ?Chronic anemia ?-Trend CBC ?-Transfuse per protocol ? ?History of renal cell cancer ?Neurogenic bladder ?-Intermittent bladder scans ? ?Best Practice (right click and "Reselect all SmartList Selections" daily)  ? ?Diet/type: Regular consistency (see orders)-will resume diet once evaluated by speech ?DVT prophylaxis: DOAC ?GI prophylaxis: PPI ?Lines: N/A ?Foley:  N/A ?Code Status:  full code ?Last date of multidisciplinary goals of care discussion: Discussed with daughter 02/12/2022 ? ?Labs   ?CBC: ?Recent Labs  ?Lab 02/11/22 ?1207 02/11/22 ?1536 02/12/22 ?6270  ?WBC 17.6* 16.3* 14.2*  ?NEUTROABS 16.0*  --   --   ?HGB  9.3* 8.4* 8.4*  ?HCT 29.6* 26.7* 27.2*  ?MCV 96.7 98.2  97.8  ?PLT 255 232 197  ? ? ?Basic Metabolic Panel: ?Recent Labs  ?Lab 02/11/22 ?1207 02/12/22 ?1610  ?NA 131* 136  ?K 3.9 3.9  ?CL 96* 100  ?CO2 27 28  ?GLUCOSE 143* 85  ?BUN 27* 28*  ?CREATININE 1.05 1.01  ?CALCIUM 7.8* 7.8*  ?MG  --  2.0  ?PHOS  --  5.4*  ? ?GFR: ?Estimated Creatinine Clearance: 59.4 mL/min (by C-G formula based on SCr of 1.01 mg/dL). ?Recent Labs  ?Lab 02/11/22 ?1207 02/11/22 ?1407 02/11/22 ?1536 02/12/22 ?9604  ?WBC 17.6*  --  16.3* 14.2*  ?LATICACIDVEN 1.7 1.2  --   --   ? ? ?Liver Function Tests: ?Recent Labs  ?Lab 02/11/22 ?1207  ?AST 27  ?ALT 16  ?ALKPHOS 60  ?BILITOT 0.4  ?PROT 6.2*  ?ALBUMIN 2.3*  ? ?No results for input(s): LIPASE, AMYLASE in the last 168 hours. ?No results for input(s): AMMONIA in the last 168 hours. ? ?ABG ?   ?Component Value Date/Time  ? PHART 7.43 02/11/2022 1218  ? PCO2ART 46 02/11/2022 1218  ? PO2ART 143 (H) 02/11/2022 1218  ? HCO3 30.5 (H) 02/11/2022 1218  ? O2SAT 99.7 02/11/2022 1218  ?  ? ?Coagulation Profile: ?Recent Labs  ?Lab 02/11/22 ?1207  ?INR 1.6*  ? ? ?Cardiac Enzymes: ?No results for input(s): CKTOTAL, CKMB, CKMBINDEX, TROPONINI in the last 168 hours. ? ?HbA1C: ?Hgb A1c MFr Bld  ?Date/Time Value Ref Range Status  ?05/26/2018 04:11 AM 5.1 4.8 - 5.6 % Final  ?  Comment:  ?  (NOTE) ?Pre diabetes:          5.7%-6.4% ?Diabetes:              >6.4% ?Glycemic control for   <7.0% ?adults with diabetes ?  ?04/03/2017 02:03 AM 5.0 4.8 - 5.6 % Final  ?  Comment:  ?  (NOTE) ?        Pre-diabetes: 5.7 - 6.4 ?        Diabetes: >6.4 ?        Glycemic control for adults with diabetes: <7.0 ?  ? ? ?CBG: ?No results for input(s): GLUCAP in the last 168 hours. ? ?Review of Systems:   ?Please see the history of present illness. All other systems reviewed and are negative  ? ?Past Medical History:  ?He,  has a past medical history of (HFpEF) heart failure with preserved ejection fraction ([place]), Allergy, Anemia, Anxiety, Arthritis, Asthma, Basal cell carcinoma, CAD (coronary  artery disease), CHF (congestive heart failure) ([place]), Chronic kidney disease, Chronic sinusitis, Collagen vascular disease ([place]), Depression, Diverticulitis, Dysrhythmia, ED (erectile dysfunction), GERD (gastroesophageal reflux disease), History of SIADH, adenomatous colonic polyps, Hypertension, Hyponatremia, IBS (irritable bowel syndrome), Neurodermatitis, Neurogenic bladder, OSA (obstructive sleep apnea), Persistent atrial fibrillation ([place]), Prostate cancer ([place]), and Renal mass.  ? ?Surgical History:  ? ?Past Surgical History:  ?Procedure Laterality Date  ? CARDIOVERSION N/A 04/23/2019  ? Procedure: CARDIOVERSION;  Surgeon: Nelva Bush, MD;  Location: [facility] ORS;  Service: Cardiovascular;  Laterality: N/A;  ? KNEE ARTHROPLASTY Right 09/02/2015  ? Procedure: COMPUTER ASSISTED TOTAL KNEE ARTHROPLASTY;  Surgeon: [name], MD;  Location: [facility] ORS;  Service: Orthopedics;  Laterality: Right;  ? LAPAROSCOPIC NEPHRECTOMY, HAND ASSISTED Right 06/17/2019  ? Procedure: HAND ASSISTED LAPAROSCOPIC NEPHRECTOMY;  Surgeon: Hollice Espy, MD;  Location: [facility] ORS;  Service: Urology;  Laterality: Right;  ? NASAL SINUS SURGERY  2009  ?  DEVIATED SEPTUM AND POLYPS  ? permanent indwelling catheter    ? PROSTATE SURGERY    ? PROSTATECTOMY  ? TOTAL KNEE ARTHROPLASTY Left 9/15  ? Dr [name]  ? URETHRAL STRICTURE DILATATION  02-2010  ? Dr.Cope  ?  ? ?Social History:  ? reports that he has never smoked. He has never used smokeless tobacco. He reports current alcohol use. He reports that he does not use drugs.  ? ?Family History:  ?His family history includes Heart disease (age of onset: 34) in his mother; Heart failure in his mother; Pneumonia (age of onset: 56) in his father; Skin cancer in his father, sister, and son. There is no history of Colon cancer, Esophageal cancer, Stomach cancer, Pancreatic cancer, or Liver disease.  ? ?Allergies ?Allergies  ?Allergen Reactions  ? Ciprofloxacin Nausea And Vomiting  ?  Headache  ?  Citalopram Hydrobromide Other (See Comments)  ?  Citalopram and Hydrobromide listed as separate allergies on MAR. Unknown reaction  ? Clindamycin Nausea And Vomiting  ? Lorazepam Other (See Comments)  ?  [place]

## 2022-02-13 NOTE — Plan of Care (Signed)
  Problem: Nutrition: Goal: Adequate nutrition will be maintained Outcome: Progressing   Problem: Coping: Goal: Level of anxiety will decrease Outcome: Progressing   Problem: Pain Managment: Goal: General experience of comfort will improve Outcome: Progressing   

## 2022-02-13 NOTE — Progress Notes (Signed)
eLink Physician-Brief Progress Note ?Patient Name: [name] ?DOB: [birth year] ?MRN: [id number] ? ? ?Date of Service ? 02/13/2022  ?HPI/Events of Note ? Patient needs PRN's for pain and sleep.  ?eICU Interventions ? Tylenol and Ephraim Hamburger ordered PRN pain, Melatonin ordered PRN sleep.  ? ? ? ?  ? ?Frederik Pear ?02/13/2022, 2:15 AM ?

## 2022-02-14 DIAGNOSIS — J690 Pneumonitis due to inhalation of food and vomit: Secondary | ICD-10-CM

## 2022-02-14 DIAGNOSIS — E785 Hyperlipidemia, unspecified: Secondary | ICD-10-CM

## 2022-02-14 DIAGNOSIS — I959 Hypotension, unspecified: Secondary | ICD-10-CM

## 2022-02-14 DIAGNOSIS — I480 Paroxysmal atrial fibrillation: Secondary | ICD-10-CM

## 2022-02-14 DIAGNOSIS — A419 Sepsis, unspecified organism: Secondary | ICD-10-CM

## 2022-02-14 DIAGNOSIS — R8271 Bacteriuria: Secondary | ICD-10-CM | POA: Diagnosis not present

## 2022-02-14 DIAGNOSIS — I5032 Chronic diastolic (congestive) heart failure: Secondary | ICD-10-CM | POA: Diagnosis not present

## 2022-02-14 DIAGNOSIS — R6521 Severe sepsis with septic shock: Secondary | ICD-10-CM

## 2022-02-14 LAB — URINE CULTURE: Culture: 100 — AB

## 2022-02-14 NOTE — Assessment & Plan Note (Signed)
Patient initially treated empirically with Vancomycin and Zosyn, which was transitioned to Unasyn IV. Now transitioned to Augmentin ?-Continue Augmentin. Complete a 5 day course of total antibiotics ?

## 2022-02-14 NOTE — Assessment & Plan Note (Signed)
Complicated by neurogenic bladder history. Asymptomatic. Enterobacter Cloacae on culture. Resistant to Cefazolin. ?

## 2022-02-14 NOTE — Consult Note (Signed)
[facility] CM Inpatient Consult ? ? ?02/14/2022 ? ?[name] ?01-29-1934 ?[id number] ? ?[place] [facility] CM) ? ?Patient chart reviewed with noted extreme risk score for unplanned readmission. Assessed for potential [facility] CM post hospital needs. ? ?Per review, patient is from ALF and current plan is for SNF. No [facility] CM needs. ? ?Of note, [facility] Care Management services does not replace or interfere with any services that are arranged by inpatient case management or social work.  ? ?Netta Cedars, MSN, RN ?[facility] Liaison  ?Toll free office [phone]  ?

## 2022-02-14 NOTE — Assessment & Plan Note (Signed)
-   Continue Xanax 

## 2022-02-14 NOTE — Assessment & Plan Note (Signed)
Currently in atrial fibrillation. Rate controlled. ?-Continue Toprol XL and Eliquis ?

## 2022-02-14 NOTE — Assessment & Plan Note (Signed)
Continue gabapentin.

## 2022-02-14 NOTE — Assessment & Plan Note (Signed)
Left lower/mid sacrum. ?

## 2022-02-14 NOTE — Assessment & Plan Note (Signed)
Grade 1 diastolic dysfunction. Stable. ?-Continue Lasix ?

## 2022-02-14 NOTE — Hospital Course (Addendum)
[name] is a 88 y.o. male with a history of heart failure, CAD, CKD stage II, anxiety, GERD, hypertension, OSA, prostate cancer and depression. Patient presented secondary to aspiration event and resultant aspiration pneumonia. Patient met sepsis criteria on admission requiring initiated of vasopressors and admission to the ICU. Patient was empirically treated with antibiotics with improvement of symptoms. ?

## 2022-02-14 NOTE — Assessment & Plan Note (Addendum)
Prior history of chronic foley and botox. ?-Continue Vibegron ?-Watch urine output ?

## 2022-02-14 NOTE — Progress Notes (Signed)
? ?PROGRESS NOTE ? ? ? ?[name]  KTG:[id number] DOB: Sep 02, 1934 DOA: 02/11/2022 ?PCP: [name], MD ? ? ?Brief Narrative: ?[name] is a 88 y.o. male with a history of heart failure, CAD, CKD stage II, anxiety, GERD, hypertension, OSA, prostate cancer and depression. Patient presented secondary to aspiration event and resultant aspiration pneumonia. Patient met sepsis criteria on admission requiring initiated of vasopressors and admission to the ICU. Patient was empirically treated with antibiotics with improvement of symptoms. ? ? ?Assessment and Plan: ?* Septic shock ([place]) ?Secondary to aspiration pneumonia. Blood cultures and urine cultures obtained. Blood cultures with no growth to date. Urine culture with enterobacter cloacae. Patient required Levophed for vasopressor support on admission, which was successfully weaned off. ?See problems, Aspiration pneumonia and Asymptomatic bacteriuria ? ?Aspiration pneumonia ([place]) ?Patient initially treated empirically with Vancomycin and Zosyn, which was transitioned to Unasyn IV. Now transitioned to Augmentin ?-Continue Augmentin. Complete a 5 day course of total antibiotics ? ?Chronic diastolic heart failure ([place]) ?Grade 1 diastolic dysfunction. Stable. ?-Continue Lasix ? ?PAF (paroxysmal atrial fibrillation) ([place]) ?Currently in atrial fibrillation. Rate controlled. ?-Continue Toprol XL and Eliquis ? ?Neurogenic bladder ?Prior history of chronic foley and botox. ?-Continue Vibegron ?-Watch urine output ? ?Asymptomatic bacteriuria ?Complicated by neurogenic bladder history. Asymptomatic. Enterobacter Cloacae on culture. Resistant to Cefazolin. ? ?Pressure injury of skin ?Left lower/mid sacrum. ? ?Iron deficiency anemia ?Hemoglobin is stable. ? ?Hypotension ?Chronic ?-Continue home midodrine ? ?Rheumatoid arthritis involving multiple sites with positive rheumatoid factor ([place]) ?-Continue methotrexate ? ?Mood disorder ([place]) ?-Continue  Xanax ? ?Hyperlipidemia ?-Continue Pravastatin ? ?Peripheral neuropathy ?-Continue gabapentin ? ? ? ?DVT prophylaxis: Eliquis ?Code Status:   Code Status: Full Code ?Family Communication: None at bedside ?Disposition Plan: Discharge home vs SNF pending PT/OT recommendations. ? ? ?Consultants:  ?PCCM ? ?Procedures:  ?None ? ?Antimicrobials: ?Vancomycin ?Zosyn ?Unasyn ?Augmentin  ? ? ?Subjective: ?Patient reports no issues overnight. Breathing well. ? ?Objective: ?BP (!) 140/48   Pulse 78   Temp 98.6 ?F (37 ?C) (Oral)   Resp (!) 24   Ht $R'5\' 9"'VA$  (1.753 m)   Wt 95.1 kg   SpO2 98%   BMI 30.96 kg/m?  ? ?Examination: ? ?General exam: Appears calm and comfortable ?Respiratory system: Clear to anterior auscultation. Respiratory effort normal. ?Cardiovascular system: S1 & S2 heard, RRR. 2/6 systolic murmur ?Gastrointestinal system: Abdomen is nondistended, soft and nontender. No organomegaly or masses felt. Normal bowel sounds heard. ?Central nervous system: Alert and oriented. No focal neurological deficits. ?Musculoskeletal: Trace edema. No calf tenderness ?Skin: No cyanosis. No rashes ?Psychiatry: Judgement and insight appear normal. Mood & affect appropriate.  ? ? ?Data Reviewed: I have personally reviewed following labs and imaging studies ? ?CBC ?Lab Results  ?Component Value Date  ? WBC 14.2 (H) 02/12/2022  ? RBC 2.78 (L) 02/12/2022  ? HGB 8.4 (L) 02/12/2022  ? HCT 27.2 (L) 02/12/2022  ? MCV 97.8 02/12/2022  ? MCH 30.2 02/12/2022  ? PLT 197 02/12/2022  ? MCHC 30.9 02/12/2022  ? RDW 15.0 02/12/2022  ? LYMPHSABS 0.6 (L) 02/11/2022  ? MONOABS 0.5 02/11/2022  ? EOSABS 0.1 02/11/2022  ? BASOSABS 0.1 02/11/2022  ? ? ? ?Last metabolic panel ?Lab Results  ?Component Value Date  ? NA 136 02/12/2022  ? K 3.9 02/12/2022  ? CL 100 02/12/2022  ? CO2 28 02/12/2022  ? BUN 28 (H) 02/12/2022  ? CREATININE 1.01 02/12/2022  ? GLUCOSE 85 02/12/2022  ? GFRNONAA >60 02/12/2022  ?  GFRAA >60 06/22/2019  ? CALCIUM 7.8 (L) 02/12/2022  ?  PHOS 5.4 (H) 02/12/2022  ? PROT 6.2 (L) 02/11/2022  ? ALBUMIN 2.3 (L) 02/11/2022  ? BILITOT 0.4 02/11/2022  ? ALKPHOS 60 02/11/2022  ? AST 27 02/11/2022  ? ALT 16 02/11/2022  ? ANIONGAP 8 02/12/2022  ? ? ?GFR: ?Estimated Creatinine Clearance: 60.9 mL/min (by C-G formula based on SCr of 1.01 mg/dL). ? ?Recent Results (from the past 240 hour(s))  ?Blood Culture (routine x 2)     Status: None (Preliminary result)  ? Collection Time: 02/11/22 12:03 PM  ? Specimen: BLOOD  ?Result Value Ref Range Status  ? Specimen Description   Final  ?  BLOOD BLOOD RIGHT WRIST ?Performed at [facility], [place] [address], [place], [place] [address] ?  ? Special Requests   Final  ?  BOTTLES DRAWN AEROBIC AND ANAEROBIC Blood Culture results may not be optimal due to an inadequate volume of blood received in culture bottles ?Performed at [facility], [place] [address] [place], [place] [address] ?  ? Culture   Final  ?  NO GROWTH 3 DAYS ?Performed at [facility], [place] [address] [place], [place] [address] ?  ? Report Status PENDING  Incomplete  ?Blood Culture (routine x 2)     Status: None (Preliminary result)  ? Collection Time: 02/11/22 12:55 PM  ? Specimen: BLOOD  ?Result Value Ref Range Status  ? Specimen Description   Final  ?  BLOOD BLOOD RIGHT WRIST ?Performed at [facility], [place] [address], [place], [place] [address] ?  ? Special Requests   Final  ?  BOTTLES DRAWN AEROBIC AND ANAEROBIC Blood Culture results may not be optimal due to an excessive volume of blood received in culture bottles ?Performed at [facility], [place] [address] [place], [place] [address] ?  ? Culture   Final  ?  NO GROWTH 3 DAYS ?Performed at [facility], [place] [address], [place], [place] [address] ?  ? Report Status PENDING  Incomplete  ?Urine Culture     Status: Abnormal  ? Collection Time: 02/11/22  4:42 PM  ? Specimen: In/Out Cath Urine  ?Result Value Ref Range Status  ?  Specimen Description   Final  ?  IN/OUT CATH URINE ?Performed at [facility], [place] [address] [place], [place] [address] ?  ? Special Requests   Final  ?  NONE ?Performed at [facility], [place] [address], [place], [place] [address] ?  ? Culture 100 COLONIES/mL ENTEROBACTER CLOACAE (A)  Final  ? Report Status 02/14/2022 FINAL  Final  ? Organism ID, Bacteria ENTEROBACTER CLOACAE (A)  Final  ?    Susceptibility  ? Enterobacter cloacae - MIC*  ?  CEFAZOLIN >=64 RESISTANT Resistant   ?  CEFEPIME <=0.12 SENSITIVE Sensitive   ?  CIPROFLOXACIN <=0.25 SENSITIVE Sensitive   ?  GENTAMICIN <=1 SENSITIVE Sensitive   ?  IMIPENEM <=0.25 SENSITIVE Sensitive   ?  NITROFURANTOIN 32 SENSITIVE Sensitive   ?  TRIMETH/SULFA <=20 SENSITIVE Sensitive   ?  PIP/TAZO <=4 SENSITIVE Sensitive   ?  * 100 COLONIES/mL ENTEROBACTER CLOACAE  ?Resp Panel by RT-PCR (Flu A&B, Covid)     Status: None  ? Collection Time: 02/11/22  4:53 PM  ?Result Value Ref Range Status  ? SARS Coronavirus 2 by RT PCR NEGATIVE NEGATIVE Final  ?  Comment: (NOTE) ?SARS-CoV-2 target nucleic  acids are NOT DETECTED. ? ?The SARS-CoV-2 RNA is generally detectable in upper respiratory ?specimens during the acute phase of infection. The lowest ?concentration of SARS-CoV-2 viral copies this assay can detect is ?138 copies/mL. A negative result does not preclude SARS-Cov-2 ?infection and should not be used as the sole basis for treatment or ?other patient management decisions. A negative result may occur with  ?improper specimen collection/handling, submission of specimen other ?than nasopharyngeal swab, presence of viral mutation(s) within the ?areas targeted by this assay, and inadequate number of viral ?copies(<138 copies/mL). A negative result must be combined with ?clinical observations, patient history, and epidemiological ?information. The expected result is Negative. ? ?Fact Sheet for Patients:   ?EntrepreneurPulse.com.au ? ?Fact Sheet for Healthcare Providers:  ?IncredibleEmployment.be ? ?This test is no t yet approved or cleared by the [place] FDA and  ?has been authorized for detection and/or diagnosis of SARS-CoV-2 by ?FDA

## 2022-02-14 NOTE — Assessment & Plan Note (Addendum)
Secondary to aspiration pneumonia. Blood cultures and urine cultures obtained. Blood cultures with no growth to date. Urine culture with enterobacter cloacae. Patient required Levophed for vasopressor support on admission, which was successfully weaned off. ?See problems, Aspiration pneumonia and Asymptomatic bacteriuria ?

## 2022-02-14 NOTE — Assessment & Plan Note (Addendum)
Baseline appears to be around 8. Most recent hemoglobin as high as 10. No evidence of bleeding currently. Hemoglobin is stable. ?

## 2022-02-14 NOTE — Assessment & Plan Note (Signed)
-  Continue methotrexate ?

## 2022-02-14 NOTE — Assessment & Plan Note (Signed)
Chronic ?-Continue home midodrine ?

## 2022-02-14 NOTE — Assessment & Plan Note (Signed)
-  Continue Pravastatin ?

## 2022-02-15 DIAGNOSIS — I5032 Chronic diastolic (congestive) heart failure: Secondary | ICD-10-CM | POA: Diagnosis not present

## 2022-02-15 DIAGNOSIS — J690 Pneumonitis due to inhalation of food and vomit: Secondary | ICD-10-CM | POA: Diagnosis not present

## 2022-02-15 DIAGNOSIS — A419 Sepsis, unspecified organism: Secondary | ICD-10-CM | POA: Diagnosis not present

## 2022-02-15 DIAGNOSIS — R8271 Bacteriuria: Secondary | ICD-10-CM | POA: Diagnosis not present

## 2022-02-15 LAB — CBC
HCT: 25.6 % — ABNORMAL LOW (ref 39.0–52.0)
Hemoglobin: 7.9 g/dL — ABNORMAL LOW (ref 13.0–17.0)
MCH: 30 pg (ref 26.0–34.0)
MCHC: 30.9 g/dL (ref 30.0–36.0)
MCV: 97.3 fL (ref 80.0–100.0)
Platelets: 175 10*3/uL (ref 150–400)
RBC: 2.63 MIL/uL — ABNORMAL LOW (ref 4.22–5.81)
RDW: 14.6 % (ref 11.5–15.5)
WBC: 6.4 10*3/uL (ref 4.0–10.5)
nRBC: 0 % (ref 0.0–0.2)

## 2022-02-15 NOTE — Progress Notes (Addendum)
? ?PROGRESS NOTE ? ? ? ?[name]  JIR:[id number] DOB: [birth year] DOA: 02/11/2022 ?PCP: [name], MD ? ? ?Brief Narrative: ?[name] is a 88 y.o. male with a history of heart failure, CAD, CKD stage II, anxiety, GERD, hypertension, OSA, prostate cancer and depression. Patient presented secondary to aspiration event and resultant aspiration pneumonia. Patient met sepsis criteria on admission requiring initiated of vasopressors and admission to the ICU. Patient was empirically treated with antibiotics with improvement of symptoms. ? ? ?Assessment and Plan: ?* Septic shock ([place]) ?Secondary to aspiration pneumonia. Blood cultures and urine cultures obtained. Blood cultures with no growth to date. Urine culture with enterobacter cloacae. Patient required Levophed for vasopressor support on admission, which was successfully weaned off. ?See problems, Aspiration pneumonia and Asymptomatic bacteriuria ? ?Aspiration pneumonia ([place]) ?Patient initially treated empirically with Vancomycin and Zosyn, which was transitioned to Unasyn IV. Now transitioned to Augmentin ?-Continue Augmentin. Complete a 5 day course of total antibiotics ? ?Chronic diastolic heart failure ([place]) ?Grade 1 diastolic dysfunction. Stable. ?-Continue Lasix ? ?PAF (paroxysmal atrial fibrillation) ([place]) ?Currently in atrial fibrillation. Rate controlled. ?-Continue Toprol XL and Eliquis ? ?Neurogenic bladder ?Prior history of chronic foley and botox. ?-Continue Vibegron ?-Watch urine output ? ?Asymptomatic bacteriuria ?Complicated by neurogenic bladder history. Asymptomatic. Enterobacter Cloacae on culture. Resistant to Cefazolin. ? ?Pressure injury of skin ?Left lower/mid sacrum. ? ?Iron deficiency anemia ?Baseline appears to be around 8. Most recent hemoglobin as high as 10. No evidence of bleeding currently. Hemoglobin is stable. ? ?Hypotension ?Chronic ?-Continue home midodrine ? ?Rheumatoid arthritis involving multiple sites with positive  rheumatoid factor ([place]) ?-Continue methotrexate ? ?Mood disorder ([place]) ?-Continue Xanax ? ?Hyperlipidemia ?-Continue Pravastatin ? ?Peripheral neuropathy ?-Continue gabapentin ? ? ? ?DVT prophylaxis: Eliquis ?Code Status:   Code Status: Full Code ?Family Communication: None at bedside ?Disposition Plan: Discharge to SNF pending bed availability. Per [facility], family plan for private pay. Medically stable for discharge. ? ? ?Consultants:  ?PCCM ? ?Procedures:  ?None ? ?Antimicrobials: ?Vancomycin ?Zosyn ?Unasyn ?Augmentin  ? ? ?Subjective: ?No issues overnight. ? ?Objective: ?BP (!) 108/52 (BP Location: Left Arm)   Pulse 81   Temp 98.3 ?F (36.8 ?C) (Oral)   Resp 18   Ht $R'5\' 9"'zL$  (1.753 m)   Wt 92.7 kg   SpO2 97%   BMI 30.18 kg/m?  ? ?Examination: ? ?General exam: Appears calm and comfortable ?Respiratory system: Respiratory effort normal. ?Cardiovascular system: S1 & S2 heard, Normal rate with regular rhythm. ?Gastrointestinal system: Abdomen is nondistended, soft and nontender. Normal bowel sounds heard. ?Central nervous system: Alert and oriented. No focal neurological deficits. ?Musculoskeletal: No calf tenderness ?Psychiatry: Judgement and insight appear normal. Mood & affect appropriate.  ? ? ?Data Reviewed: I have personally reviewed following labs and imaging studies ? ?CBC ?Lab Results  ?Component Value Date  ? WBC 6.4 02/15/2022  ? RBC 2.63 (L) 02/15/2022  ? HGB 7.9 (L) 02/15/2022  ? HCT 25.6 (L) 02/15/2022  ? MCV 97.3 02/15/2022  ? MCH 30.0 02/15/2022  ? PLT 175 02/15/2022  ? MCHC 30.9 02/15/2022  ? RDW 14.6 02/15/2022  ? LYMPHSABS 0.6 (L) 02/11/2022  ? MONOABS 0.5 02/11/2022  ? EOSABS 0.1 02/11/2022  ? BASOSABS 0.1 02/11/2022  ? ? ? ?Last metabolic panel ?Lab Results  ?Component Value Date  ? NA 136 02/12/2022  ? K 3.9 02/12/2022  ? CL 100 02/12/2022  ? CO2 28 02/12/2022  ? BUN 28 (H) 02/12/2022  ? CREATININE 1.01 02/12/2022  ?  GLUCOSE 85 02/12/2022  ? GFRNONAA >60 02/12/2022  ? GFRAA >60 06/22/2019  ?  CALCIUM 7.8 (L) 02/12/2022  ? PHOS 5.4 (H) 02/12/2022  ? PROT 6.2 (L) 02/11/2022  ? ALBUMIN 2.3 (L) 02/11/2022  ? BILITOT 0.4 02/11/2022  ? ALKPHOS 60 02/11/2022  ? AST 27 02/11/2022  ? ALT 16 02/11/2022  ? ANIONGAP 8 02/12/2022  ? ? ?GFR: ?Estimated Creatinine Clearance: 60.1 mL/min (by C-G formula based on SCr of 1.01 mg/dL). ? ?Recent Results (from the past 240 hour(s))  ?Blood Culture (routine x 2)     Status: None (Preliminary result)  ? Collection Time: 02/11/22 12:03 PM  ? Specimen: BLOOD  ?Result Value Ref Range Status  ? Specimen Description   Final  ?  BLOOD BLOOD RIGHT WRIST ?Performed at [facility], [place] [address] [place], [place] [address] ?  ? Special Requests   Final  ?  BOTTLES DRAWN AEROBIC AND ANAEROBIC Blood Culture results may not be optimal due to an inadequate volume of blood received in culture bottles ?Performed at [facility], [place] [address] [place], [place] [address] ?  ? Culture   Final  ?  NO GROWTH 4 DAYS ?Performed at [facility], [place] [address], [place], [place] [address] ?  ? Report Status PENDING  Incomplete  ?Blood Culture (routine x 2)     Status: None (Preliminary result)  ? Collection Time: 02/11/22 12:55 PM  ? Specimen: BLOOD  ?Result Value Ref Range Status  ? Specimen Description   Final  ?  BLOOD BLOOD RIGHT WRIST ?Performed at [facility], [place] [address], [place], [place] [address] ?  ? Special Requests   Final  ?  BOTTLES DRAWN AEROBIC AND ANAEROBIC Blood Culture results may not be optimal due to an excessive volume of blood received in culture bottles ?Performed at [facility] [place] [address], [place], [place] [address] ?  ? Culture   Final  ?  NO GROWTH 4 DAYS ?Performed at [facility], [place] [address], [place], [place] [address] ?  ? Report Status PENDING  Incomplete  ?Urine Culture     Status: Abnormal  ? Collection Time: 02/11/22  4:42 PM  ? Specimen: In/Out Cath Urine   ?Result Value Ref Range Status  ? Specimen Description   Final  ?  IN/OUT CATH URINE ?Performed at [facility], [place] [address], [place], [place] [address] ?  ? Special Requests   Final  ?  NONE ?Performed at [facility], [place] [address] [place],  [address] ?  ? Culture 100 COLONIES/mL ENTEROBACTER CLOACAE (A)  Final  ? Report Status 02/14/2022 FINAL  Final  ? Organism ID, Bacteria ENTEROBACTER CLOACAE (A)  Final  ?    Susceptibility  ? Enterobacter cloacae - MIC*  ?  CEFAZOLIN >=64 RESISTANT Resistant   ?  CEFEPIME <=0.12 SENSITIVE Sensitive   ?  CIPROFLOXACIN <=0.25 SENSITIVE Sensitive   ?  GENTAMICIN <=1 SENSITIVE Sensitive   ?  IMIPENEM <=0.25 SENSITIVE Sensitive   ?  NITROFURANTOIN 32 SENSITIVE Sensitive   ?  TRIMETH/SULFA <=20 SENSITIVE Sensitive   ?  PIP/TAZO <=4 SENSITIVE Sensitive   ?  * 100 COLONIES/mL ENTEROBACTER CLOACAE  ?Resp Panel by RT-PCR (Flu A&B, Covid)     Status: None  ? Collection Time: 02/11/22  4:53 PM  ?Result Value Ref Range Status  ? SARS Coronavirus 2 by RT PCR NEGATIVE  NEGATIVE Final  ?  Comment: (NOTE) ?SARS-CoV-2 target nucleic acids are NOT DETECTED. ? ?The SARS-CoV-2 RNA is generally detectable in upper respiratory ?specimens during the acute phase of infection. The lowest ?concentration of SARS-CoV-2 viral copies this assay can detect is ?138 copies/mL. A negative result does not preclude SARS-Cov-2 ?infection and should not be used as the sole basis for treatment or ?other patient management decisions. A negative result may occur with  ?improper specimen collection/handling, submission of specimen other ?than nasopharyngeal swab, presence of viral mutation(s) within the ?areas targeted by this assay, and inadequate number of viral ?copies(<138 copies/mL). A negative result must be combined with ?clinical observations, patient history, and epidemiological ?information. The expected result is Negative. ? ?Fact Sheet for Patients:   ?EntrepreneurPulse.com.au ? ?Fact Sheet for Healthcare Providers:  ?IncredibleEmployment.be ? ?This test is no t yet approved or cleared by the [place] and  ?has been authorized f

## 2022-02-15 NOTE — Evaluation (Signed)
Physical Therapy Evaluation ?Patient Details ?Name: [name] ?MRN: [id number] ?DOB: Sep 11, 1934 ?Today's Date: 02/15/2022 ? ?History of Present Illness ? [name] is a 88 year old male with past medical history significant for diastolic congestive heart failure, CAD, CKD, anxiety, GERD, hypertension, obstructive sleep apnea, prostate cancer, and depression who presented from [facility] after suffering aspiration event, hypoxia.  ?Clinical Impression ? Patient  noted to be congested in throat and asking to  spit out secretions. Patient noted with coughing when drinking water and trying to eat eggs. Patient was in chair position of bed and upright. SPO2 94% on 3 L. ? Patient requires total assistance for mobility, demonstrates no sitting balance.Patient  appears to  be at his baseline. Patient unable to clarify PLOF at ALF. Patient  has limited potential to return to functional level of function.  Will provide PT on a trial basis to prevent further decline to decrease burden of care for DC to ALF or longterm  facility. ?   ? ?Recommendations for follow up therapy are one component of a multi-disciplinary discharge planning process, led by the attending physician.  Recommendations may be updated based on patient status, additional functional criteria and insurance authorization. ? ?Follow Up Recommendations Long-term institutional care without follow-up therapy ? ?  ?Assistance Recommended at Discharge Frequent or constant Supervision/Assistance  ?Patient can return home with the following ? Two people to help with walking and/or transfers;A lot of help with bathing/dressing/bathroom;Assist for transportation ? ?  ?Equipment Recommendations None recommended by PT  ?Recommendations for Other Services ?    ?  ?Functional Status Assessment Patient has had a recent decline in their functional status and/or demonstrates limited ability to make significant improvements in function in a reasonable and predictable amount of  time  ? ?  ?Precautions / Restrictions Precautions ?Precautions: Fall ?Precaution Comments: monitor sats  ? ?  ? ?Mobility ? Bed Mobility ?Overal bed mobility: Needs Assistance ?Bed Mobility: Supine to Sit, Sit to Sidelying, Sit to Supine ?  ?  ?Supine to sit: +2 for safety/equipment, +2 for physical assistance, Total assist, HOB elevated ?Sit to supine: Total assist, +2 for safety/equipment, +2 for physical assistance ?  ?General bed mobility comments: patient unable to assist with any mobility. ?  ? ?Transfers ?  ?  ?  ?  ?  ?  ?  ?  ?  ?General transfer comment: will need amechanical  lift ?  ? ?Ambulation/Gait ?  ?  ?  ?  ?  ?  ?  ?  ? ?Stairs ?  ?  ?  ?  ?  ? ?Wheelchair Mobility ?  ? ?Modified Rankin (Stroke Patients Only) ?  ? ?  ? ?Balance Overall balance assessment: Needs assistance ?Sitting-balance support: Feet supported, No upper extremity supported ?Sitting balance-[name]: Zero ?Sitting balance - Comments: patient unable to assist with sitting balance, strong posterior and right lean, total assistance to stay upright ?Postural control: Posterior lean, Right lateral lean ?  ?  ?  ?  ?  ?  ?  ?  ?  ?  ?  ?  ?  ?  ?   ? ? ? ?Pertinent Vitals/Pain Pain Assessment ?Pain Assessment: Faces ?Faces Pain Scale: Hurts whole lot ?Pain Location: arms, back when moved and sitting up. "I have bad arthritis" ?Pain Descriptors / Indicators: Aching, Grimacing, Moaning ?Pain Intervention(s): Limited activity within patient's tolerance, Monitored during session  ? ? ?Home Living Family/patient expects to be discharged to:: Assisted living ?  ?  ?  ?  ?  ?  ?  ?  ?  ?  Additional Comments: patient  from ALF, nonambuatory  ?  ?Prior Function Prior Level of Function : Needs assist ?  ?  ?  ?  ?  ?  ?Mobility Comments: basically total care with ADLs and transfer, ? if able to pivot at ALF ?  ?  ? ? ?Hand Dominance  ? Dominant Hand: Right ? ?  ?Extremity/Trunk Assessment  ?   ?  ? ?Lower Extremity Assessment ?Lower Extremity  Assessment:  (barely able to move the legs in the bed) ?RLE Deficits / Details: leg rotates to the right, ?  ? ?Cervical / Trunk Assessment ?Cervical / Trunk Assessment: Kyphotic ?Cervical / Trunk Exceptions: limited hip flex to sit up on bed edge  ?Communication  ? Communication: HOH  ?Cognition Arousal/Alertness: Awake/alert ?Behavior During Therapy: [facility] for tasks assessed/performed ?Overall Cognitive Status: Impaired/Different from baseline ?Area of Impairment: Orientation ?  ?  ?  ?  ?  ?  ?  ?  ?Orientation Level: Situation, Time ?  ?  ?  ?  ?  ?  ?General Comments: states he was living with his daughter and wife, then states that Demetrius Charity does not have hot water, ?  ?  ? ?  ?General Comments   ? ?  ?Exercises    ? ?Assessment/Plan  ?  ?PT Assessment Patient needs continued PT services (to decrease caregiver burden to return to ALF)  ?PT Problem List Decreased strength;Decreased mobility;Decreased range of motion;Decreased cognition;Decreased activity tolerance;Decreased balance;Pain ? ?   ?  ?PT Treatment Interventions Therapeutic activities;Cognitive remediation;Therapeutic exercise;Patient/family education;Functional mobility training;Balance training   ? ?PT Goals (Current goals can be found in the Care Plan section)  ?Acute Rehab PT Goals ?Patient Stated Goal: wants to sell his house and move to a new place ?PT Goal Formulation: Patient unable to participate in goal setting ?Time For Goal Achievement: 03/01/22 ?Potential to Achieve Goals: Poor ? ?  ?Frequency Min 2X/week ?  ? ? ?Co-evaluation   ?  ?  ?  ?  ? ? ?  ?AM-PAC PT "6 Clicks" Mobility  ?Outcome Measure Help needed turning from your back to your side while in a flat bed without using bedrails?: Total ?Help needed moving from lying on your back to sitting on the side of a flat bed without using bedrails?: Total ?Help needed moving to and from a bed to a chair (including a wheelchair)?: Total ?Help needed standing up from a chair using your arms  (e.g., wheelchair or bedside chair)?: Total ?Help needed to walk in hospital room?: Total ?Help needed climbing 3-5 steps with a railing? : Total ?6 Click Score: 6 ? ?  ?End of Session Equipment Utilized During Treatment: Oxygen ?Activity Tolerance: Patient limited by fatigue;Patient limited by pain ?Patient left: in bed;with call bell/phone within reach;with bed alarm set ?Nurse Communication: Mobility status ?PT Visit Diagnosis: Unsteadiness on feet (R26.81) ?  ? ?Time: 4536-4680 ?PT Time Calculation (min) (ACUTE ONLY): 31 min ? ? ?Charges:   PT Evaluation ?$PT Eval Low Complexity: 1 Low ?  ?  ?   ? ? ?Tresa Endo PT ?Acute Rehabilitation Services ?Pager [phone] ?Office [phone] ? ?Claretha Cooper ?02/15/2022, 9:39 AM ? ?

## 2022-02-15 NOTE — TOC Progression Note (Addendum)
Transition of Care (TOC) - Progression Note  ? ? ?Patient Details  ?Name: [name] ?MRN: [id number] ?Date of Birth: October 01, 1934 ? ?Transition of Care (TOC) CM/SW Contact  ?Dennis Killilea, Marjie Skiff, RN ?Phone Number: ?02/15/2022, 2:23 PM ? ?Clinical Narrative:    ?Spoke with daughter Cinda Quest via phone as pt is a poor historian. Per Cinda Quest pt was moved into [facility] assisted living 2 weeks ago. She states that they don't like it there and he "can't go back" Per Cinda Quest he has used most all of his Medicare days at [facility] and the last time he was at [facility] he did not improve mobility wise. Cinda Quest understands that [facility] will very likely deny any more SNF and she is willing to private pay for SNF until she can get him in somewhere long term. Malinda informed that we cannot do long term placement from the hospital but would fax him out in effort to find a private pay short term rehab bed. FL2 faxed out to [place] areas. TOC will continue to follow. ? ?Pasrr received: [phone] A ? ?Expected Discharge Plan: [place] ?Barriers to Discharge: Continued Medical Work up ? ?Expected Discharge Plan and Services ?Expected Discharge Plan: [place] ?  ?Discharge Planning Services: CM Consult ?  ?Living arrangements for the past 2 months: [place] ?                ? Readmission Risk Interventions ?Readmission Risk Prevention Plan 02/15/2022 02/13/2022 09/12/2021  ?Transportation Screening Complete Complete Complete  ?PCP or Specialist Appt within 3-5 Days - - -  ?[place] or [place] - - -  ?Social Work Consult for [place] Planning/Counseling - - -  ?Palliative Care Screening - - -  ?Medication Review Press photographer) Complete Complete Complete  ?PCP or Specialist appointment within 3-5 days of discharge Complete Complete Complete  ?[place] or Home Care Consult Complete Complete Complete  ?SW Recovery Care/Counseling Consult Complete Complete Complete   ?Palliative Care Screening Complete Complete Not Applicable  ?Skilled Nursing Facility Complete Complete Complete  ?Some recent data might be hidden  ? ? ?

## 2022-02-15 NOTE — Progress Notes (Signed)
Occupational Therapy Evaluation ? ?Patient is unreliable narrator, difficulty explaining where he was living just prior to hospitalization. Per chart review patient at ALF. Patient presents with global weakness, limited upper extremity ROM 2* arthritis and zero sitting balance needing total A to maintain static sitting due to strong posterior lean. Patient was able to eat breakfast in bed with set up assist in chair position. Unsure exactly how patient was transferring prior to hospitalization however bilateral legs in wind swept position, question whether he could safely transfer vs needing mechanical lift. Patient demonstrating very limited potential for independence with self care tasks, recommend 24/7 care at D/C. No further acute OT needs. Will sign off, please re-consult if new needs arise. ? ? ? 02/15/22 1100  ?OT Visit Information  ?Last OT Received On 02/15/22  ?Assistance Needed +2  ?PT/OT/SLP Co-Evaluation/Treatment Yes  ?Reason for Co-Treatment For patient/therapist safety;To address functional/ADL transfers  ?PT goals addressed during session Mobility/safety with mobility  ?OT goals addressed during session ADL's and self-care  ?History of Present Illness [name] is a 88 year old male with past medical history significant for diastolic congestive heart failure, CAD, CKD, anxiety, GERD, hypertension, obstructive sleep apnea, prostate cancer, and depression who presented from [facility]F after suffering aspiration event, hypoxia.  ?Precautions  ?Precautions Fall  ?Precaution Comments monitor sats  ?Restrictions  ?Weight Bearing Restrictions No  ?Home Living  ?Family/patient expects to be discharged to: Assisted living  ?Additional Comments patient  from ALF, nonambuatory  ?Prior Function  ?Prior Level of Function  Needs assist;Patient poor historian/Family not available  ?Mobility Comments basically total care with ADLs and transfer, ? if able to pivot at ALF  ?Communication  ?Communication HOH  ?Pain  Assessment  ?Pain Assessment Faces  ?Faces Pain Scale 8  ?Pain Location arms, back when moved and sitting up. "I have bad arthritis"  ?Pain Descriptors / Indicators Aching;Grimacing;Moaning  ?Pain Intervention(s) Monitored during session;Limited activity within patient's tolerance  ?Cognition  ?Arousal/Alertness Awake/alert  ?Behavior During Therapy [facility] for tasks assessed/performed  ?Overall Cognitive Status No family/caregiver present to determine baseline cognitive functioning  ?Area of Impairment Memory  ?Memory Decreased short-term memory  ?General Comments Patient currently able to state hes in the hospital cannot recall name, knows month/year however much difficulty providing PLOF/background info. Initially stating living with wife and DTR then mentioning leaving ALF [facility] because you couldn't have appliances, no hot water.  ?Upper Extremity Assessment  ?Upper Extremity Assessment RUE deficits/detail;LUE deficits/detail  ?RUE Deficits / Details Patient reports arthritis in bilateral shoulders. AAROM flexion ~ 40 degrees  ?RUE Unable to fully assess due to pain  ?LUE Deficits / Details arthritis pain; AAROM ~30 degrees  ?LUE Unable to fully assess due to pain  ?Lower Extremity Assessment  ?Lower Extremity Assessment Defer to PT evaluation  ?Cervical / Trunk Assessment  ?Cervical / Trunk Assessment Kyphotic  ?Vision- History  ?Baseline Vision/History 1 Wears glasses  ?ADL  ?Overall ADL's  At baseline  ?General ADL Comments Patient needing extensive assistance for all self care tasks, unable to maintain static sitting balance without total support/posterior lean. Set up patient able to feed himself breakfast.  ?Bed Mobility  ?Overal bed mobility Needs Assistance  ?Bed Mobility Supine to Sit;Sit to Supine  ?Supine to sit +2 for safety/equipment;+2 for physical assistance;Total assist;HOB elevated  ?Sit to supine Total assist;+2 for safety/equipment;+2 for physical assistance  ?General bed mobility comments  patient unable to assist with any mobility.  ?Transfers  ?General transfer comment will need a mechanical  lift  ?Balance  ?Overall balance assessment Needs assistance  ?Sitting-balance support Feet supported;No upper extremity supported  ?Sitting balance-[name] Zero  ?Sitting balance - Comments patient unable to assist with sitting balance, strong posterior and right lean, total assistance to stay upright  ?OT - End of Session  ?Activity Tolerance Patient limited by pain  ?Patient left in bed;with call bell/phone within reach;with bed alarm set  ?Nurse Communication Need for lift equipment  ?OT Assessment  ?OT Recommendation/Assessment Patient does not need any further OT services  ?OT Visit Diagnosis Other abnormalities of gait and mobility (R26.89)  ?OT Problem List Pain;Decreased activity tolerance;Impaired balance (sitting and/or standing);Decreased cognition;Decreased safety awareness  ?AM-PAC OT "6 Clicks" Daily Activity Outcome Measure (Version 2)  ?Help from another person eating meals? 3  ?Help from another person taking care of personal grooming? 3  ?Help from another person toileting, which includes using toliet, bedpan, or urinal? 1  ?Help from another person bathing (including washing, rinsing, drying)? 1  ?Help from another person to put on and taking off regular upper body clothing? 1  ?Help from another person to put on and taking off regular lower body clothing? 1  ?6 Click Score 10  ?Progressive Mobility  ?What is the highest level of mobility based on the progressive mobility assessment? Level 1 (Bedfast) - Unable to balance while sitting on edge of bed  ?Activity Dangled on edge of bed  ?OT Recommendation  ?Follow Up Recommendations Long-term institutional care without follow-up therapy  ?Assistance recommended at discharge Frequent or constant Supervision/Assistance  ?Patient can return home with the following Two people to help with walking and/or transfers;Two people to help with  bathing/dressing/bathroom;Assistance with cooking/housework;Direct supervision/assist for medications management;Direct supervision/assist for financial management;Assist for transportation;Help with stairs or ramp for entrance  ?OT Equipment None recommended by OT  ?Acute Rehab OT Goals  ?Patient Stated Goal Leave [place] and get apartment with family  ?OT Goal Formulation All assessment and education complete, DC therapy  ?OT Time Calculation  ?OT Start Time (ACUTE ONLY) [phone]  ?OT Stop Time (ACUTE ONLY) 0904  ?OT Time Calculation (min) 27 min  ?OT General Charges  ?$OT Visit 1 Visit  ?OT Evaluation  ?$OT Eval Low Complexity 1 Low  ?Written Expression  ?Dominant Hand Right  ? ?Delbert Phenix OT ?OT pager: [phone] ? ?

## 2022-02-15 NOTE — NC FL2 (Signed)
?[place] MEDICAID FL2 LEVEL OF CARE SCREENING TOOL  ?  ? ?IDENTIFICATION  ?Patient Name: ?[name] Birthdate: Jul 26, 1934 Sex: male Admission Date (Current Location): ?02/11/2022  ?[place] and [place] Number: ? [place] ?  Facility and Address:  ?[facility]  [place], [place] ?     Provider Number: ?[id number]  ?Attending Physician Name and Address:  ?Mariel Aloe, MD ? Relative Name and Phone Number:  ?  ?   ?Current Level of Care: ?Hospital Recommended Level of Care: ?[place] Prior Approval Number: ?  ? ?Date Approved/Denied: ?  PASRR Number: ?  ? ?Discharge Plan: ?SNF ?  ? ?Current Diagnoses: ?Patient Active Problem List  ? Diagnosis Date Noted  ? Chronic diastolic heart failure ([place]) 02/14/2022  ? Aspiration pneumonia ([place]) 02/14/2022  ? Asymptomatic bacteriuria 02/14/2022  ? Pressure injury of skin 02/13/2022  ? Septic shock ([place]) 02/11/2022  ? CAP (community acquired pneumonia) 12/11/2021  ? Acute heart failure with preserved ejection fraction ([place])   ? Atrial fibrillation ([place])   ? Iron deficiency anemia   ? Decubitus ulcer of left buttock, stage 2 ([place]) 12/03/2021  ? Asthma exacerbation 10/05/2021  ? PAF (paroxysmal atrial fibrillation) ([place]) 10/05/2021  ? Sacral decubitus ulcer, stage II ([place]) 10/05/2021  ? E coli bacteremia   ? Acute kidney injury superimposed on CKD ([place]) 09/11/2021  ? Hypotension   ? Thrombocytopenia ([place])   ? Acute on chronic diastolic heart failure ([place])   ? Generalized weakness   ? Neuropathy   ? Severe persistent asthma 02/05/2021  ? Spondylosis without myelopathy or radiculopathy, lumbosacral region 11/24/2020  ? Elevated C-reactive protein (CRP) 11/24/2020  ? Elevated sed rate 11/24/2020  ? Chronic pain syndrome 11/04/2020  ? Disorder of skeletal system 11/04/2020  ? Physical deconditioning 11/04/2020  ? Abnormal MRI, lumbar spine (09/21/2020) 11/04/2020  ? Chronic low back pain (Bilateral) w/o sciatica 11/04/2020  ? DDD  (degenerative disc disease), lumbosacral 11/04/2020  ? Lumbosacral foraminal stenosis 11/04/2020  ? Lumbar facet hypertrophy 11/04/2020  ? Levoscoliosis of lumbosacral spine 11/04/2020  ? Grade 1 Retrolisthesis at L2-3 and L3-4 11/04/2020  ? Chronic anticoagulation (Eliquis) 11/04/2020  ? Lumbar facet syndrome 11/04/2020  ? Long term prescription benzodiazepine use 11/04/2020  ? Chronic shoulder pain (Bilateral) 11/04/2020  ? Chronic elbow pain (Bilateral) 11/04/2020  ? Chronic wrist pain (Bilateral) 11/04/2020  ? Chronic sinusitis 07/12/2020  ? Persistent atrial fibrillation ([place]) 07/12/2020  ? Pancytopenia ([place]) 09/12/2019  ? Edema of lower extremity 09/12/2019  ? Hyposmolality and/or hyponatremia 09/12/2019  ? Renal cell carcinoma ([place]) 07/11/2019  ? AF (paroxysmal atrial fibrillation) ([place]) 05/09/2019  ? History of TIA (transient ischemic attack) 12/06/2018  ? Vision changes 12/06/2018  ? TIA (transient ischemic attack) 06/06/2018  ? Carotid artery disease ([place]) 06/06/2018  ? Long term current use of immunosuppressive drug 04/24/2018  ? Obesity (BMI 30-39.9) 04/24/2018  ? SIADH (syndrome of inappropriate ADH production) ([place]) 04/17/2017  ? AKI (acute kidney injury) ([place]) 04/03/2017  ? Hyponatremia 01/16/2017  ? Urinary tract infection associated with indwelling urethral catheter ([place]) 12/19/2016  ? Asthma with acute exacerbation 11/14/2016  ? Mild intermittent asthma without complication 02/63/7858  ? Mood disorder ([place]) 01/18/2016  ? Presence of indwelling urinary catheter 08/20/2015  ? Advanced directives, counseling/discussion 07/28/2014  ? Routine general medical examination at a health care facility 04/09/2012  ? Venous stasis dermatitis 06/13/2011  ? Sleep disturbance 12/21/2010  ? CONSTIPATION, CHRONIC 12/07/2010  ? Chronic constipation  12/07/2010  ? Hyperlipidemia 12/02/2010  ? OSTEOARTHRITIS 12/02/2010  ? Neurogenic bladder 05/24/2010  ? IBS 05/05/2010  ? History of colonic polyps 05/05/2010  ?  Peripheral neuropathy 05/03/2010  ? Mononeuritis 05/03/2010  ? Personal history of prostate cancer 03/04/2010  ? Benign essential hypertension 03/25/2009  ? GAD (generalized anxiety disorder) 01/27/2007  ? Coronary atherosclerosis of native coronary artery 01/27/2007  ? Allergic asthma 01/27/2007  ? GERD 01/27/2007  ? Chronic rheumatic arthritis ([place]) 01/27/2007  ? Rheumatoid arthritis involving multiple sites with positive rheumatoid factor ([place]) 01/27/2007  ? ? ?Orientation RESPIRATION BLADDER Height & Weight   ?  ?Time, Self, Situation, Place ? Normal Incontinent Weight: 92.7 kg ?Height:  '5\' 9"'$  (175.3 cm)  ?BEHAVIORAL SYMPTOMS/MOOD NEUROLOGICAL BOWEL NUTRITION STATUS  ?    Incontinent Diet (Regular)  ?AMBULATORY STATUS COMMUNICATION OF NEEDS Skin   ?Extensive Assist Verbally PU Stage and Appropriate Care ?  ?PU Stage 2 Dressing: Daily (Foam) ?  ?    ?     ?     ? ? ?Personal Care Assistance Level of Assistance  ?Bathing, Feeding, Dressing Bathing Assistance: Limited assistance ?Feeding assistance: Limited assistance ?Dressing Assistance: Limited assistance ?   ? ?Functional Limitations Info  ?Sight, Hearing, Speech Sight Info: Impaired ?Hearing Info: Impaired ?Speech Info: Adequate  ? ? ?SPECIAL CARE FACTORS FREQUENCY  ?PT (By licensed PT), OT (By licensed OT)   ?  ?PT Frequency: 3 x weekly ?OT Frequency: 3 x weekly ?  ?  ?  ?   ? ? ?Contractures Contractures Info: Not present  ? ? ?Additional Factors Info  ?Code Status, Allergies Code Status Info: Full ?Allergies Info: Ciprofloxacin, Citalopram Hydrobromide, Clindamycin, Lorazepam, Paroxetine, Ramipril, Simvastatin, Sulfa Antibiotics ?  ?  ?  ?   ? ?Current Medications (02/15/2022):  This is the current hospital active medication list ?Current Facility-Administered Medications  ?Medication Dose Route Frequency Provider Last Rate Last Admin  ? 0.9 %  sodium chloride infusion  250 mL Intravenous Continuous Gerald Leitz D, NP   Stopped at 02/13/22 1402  ?  acetaminophen (TYLENOL) tablet 650 mg  650 mg Oral Q4H PRN Frederik Pear, MD   650 mg at 02/15/22 1017  ? albuterol (PROVENTIL) (2.5 MG/3ML) 0.083% nebulizer solution 3 mL  3 mL Nebulization Q6H PRN Olalere, Adewale A, MD      ? ALPRAZolam Duanne Moron) tablet 0.5 mg  0.5 mg Oral QHS Olalere, Adewale A, MD   0.5 mg at 02/14/22 2253  ? amoxicillin-clavulanate (AUGMENTIN) 875-125 MG per tablet 1 tablet  1 tablet Oral Q12H Olalere, Adewale A, MD   1 tablet at 02/15/22 1003  ? apixaban (ELIQUIS) tablet 5 mg  5 mg Oral BID Olalere, Adewale A, MD   5 mg at 02/15/22 1003  ? ascorbic acid (VITAMIN C) tablet 1,000 mg  1,000 mg Oral Daily Olalere, Adewale A, MD   1,000 mg at 02/15/22 1004  ? chlorhexidine (PERIDEX) 0.12 % solution 15 mL  15 mL Mouth/Throat Daily PRN Olalere, Adewale A, MD      ? Chlorhexidine Gluconate Cloth 2 % PADS 6 each  6 each Topical Daily Olalere, Adewale A, MD   6 each at 02/15/22 1004  ? cholecalciferol (VITAMIN D) tablet 2,000 Units  2,000 Units Oral Daily Olalere, Adewale A, MD   2,000 Units at 02/15/22 1004  ? diclofenac Sodium (VOLTAREN) 1 % topical gel 2 g  2 g Topical QID Olalere, Adewale A, MD   2 g at 02/15/22 1004  ?  docusate sodium (COLACE) capsule 100 mg  100 mg Oral BID PRN Gerald Leitz D, NP      ? EPINEPHrine (EPI-PEN) injection 0.3 mg  0.3 mg Intramuscular PRN Olalere, Adewale A, MD      ? fentaNYL (SUBLIMAZE) injection 50 mcg  50 mcg Intravenous Q2H PRN Olalere, Adewale A, MD   50 mcg at 91/47/82 9562  ? folic acid (FOLVITE) tablet 1 mg  1 mg Oral Daily Olalere, Adewale A, MD   1 mg at 02/15/22 1004  ? furosemide (LASIX) tablet 40 mg  40 mg Oral Daily Olalere, Adewale A, MD   40 mg at 02/15/22 1019  ? gabapentin (NEURONTIN) capsule 600 mg  600 mg Oral BID Olalere, Adewale A, MD   600 mg at 02/15/22 1003  ? hydroxychloroquine (PLAQUENIL) tablet 200 mg  200 mg Oral BID Olalere, Adewale A, MD   200 mg at 02/15/22 1018  ? iron polysaccharides (NIFEREX) capsule 150 mg  150 mg Oral Daily  Olalere, Adewale A, MD   150 mg at 02/15/22 1003  ? loratadine (CLARITIN) tablet 10 mg  10 mg Oral Daily Olalere, Adewale A, MD   10 mg at 02/15/22 1003  ? MEDLINE mouth rinse  15 mL Mouth Rinse BID Olalere, Adewale A, MD

## 2022-02-16 DIAGNOSIS — I5032 Chronic diastolic (congestive) heart failure: Secondary | ICD-10-CM | POA: Diagnosis not present

## 2022-02-16 DIAGNOSIS — A419 Sepsis, unspecified organism: Secondary | ICD-10-CM | POA: Diagnosis not present

## 2022-02-16 DIAGNOSIS — M0579 Rheumatoid arthritis with rheumatoid factor of multiple sites without organ or systems involvement: Secondary | ICD-10-CM

## 2022-02-16 DIAGNOSIS — F39 Unspecified mood [affective] disorder: Secondary | ICD-10-CM

## 2022-02-16 DIAGNOSIS — J690 Pneumonitis due to inhalation of food and vomit: Secondary | ICD-10-CM | POA: Diagnosis not present

## 2022-02-16 DIAGNOSIS — N319 Neuromuscular dysfunction of bladder, unspecified: Secondary | ICD-10-CM

## 2022-02-16 DIAGNOSIS — R8271 Bacteriuria: Secondary | ICD-10-CM | POA: Diagnosis not present

## 2022-02-16 LAB — CBC
HCT: 26.1 % — ABNORMAL LOW (ref 39.0–52.0)
Hemoglobin: 7.7 g/dL — ABNORMAL LOW (ref 13.0–17.0)
MCH: 29.1 pg (ref 26.0–34.0)
MCHC: 29.5 g/dL — ABNORMAL LOW (ref 30.0–36.0)
MCV: 98.5 fL (ref 80.0–100.0)
Platelets: 171 10*3/uL (ref 150–400)
RBC: 2.65 MIL/uL — ABNORMAL LOW (ref 4.22–5.81)
RDW: 14.6 % (ref 11.5–15.5)
WBC: 7.1 10*3/uL (ref 4.0–10.5)
nRBC: 0 % (ref 0.0–0.2)

## 2022-02-16 LAB — BASIC METABOLIC PANEL
Anion gap: 6 (ref 5–15)
BUN: 19 mg/dL (ref 8–23)
CO2: 36 mmol/L — ABNORMAL HIGH (ref 22–32)
Calcium: 8.3 mg/dL — ABNORMAL LOW (ref 8.9–10.3)
Chloride: 98 mmol/L (ref 98–111)
Creatinine, Ser: 0.77 mg/dL (ref 0.61–1.24)
GFR, Estimated: >60 mL/min (ref >60)
Glucose, Bld: 108 mg/dL — ABNORMAL HIGH (ref 70–99)
Potassium: 3.2 mmol/L — ABNORMAL LOW (ref 3.5–5.1)
Sodium: 140 mmol/L (ref 135–145)

## 2022-02-16 LAB — CULTURE, BLOOD (ROUTINE X 2)
Culture: NO GROWTH
Culture: NO GROWTH

## 2022-02-16 MED ORDER — ALPRAZOLAM 0.5 MG PO TABS: 0.5000 mg | ORAL_TABLET | Freq: Every day | ORAL | 0 refills | Status: AC

## 2022-02-16 MED ORDER — POTASSIUM CHLORIDE CRYS ER 20 MEQ PO TBCR
40.0000 meq | EXTENDED_RELEASE_TABLET | ORAL | Status: AC
  Administered 2022-02-16 (×2): 40 meq via ORAL
  Filled 2022-02-16 (×2): qty 2

## 2022-02-16 MED ORDER — AMOXICILLIN-POT CLAVULANATE 875-125 MG PO TABS
1.0000 | ORAL_TABLET | Freq: Two times a day (BID) | ORAL | 0 refills | Status: AC
Start: 2022-02-16 — End: 2022-02-18

## 2022-02-16 NOTE — Progress Notes (Signed)
This RN assisted Aziya to call report to SNF at [phone]. Report given to [facility] RN with no further questions at this time. Primary RN Adline Peals made aware also.  ?

## 2022-02-16 NOTE — TOC Transition Note (Signed)
Transition of Care (TOC) - CM/SW Discharge Note ? ? ?Patient Details  ?Name: [name] ?MRN: [id number] ?Date of Birth: 1933-12-12 ? ?Transition of Care (TOC) CM/SW Contact:  ?Marshell Dilauro, Marjie Skiff, RN ?Phone Number: ?02/16/2022, 10:30 AM ? ? ?Clinical Narrative:    ?SNF bed offers provided to pt daughter Cinda Quest. [facility] chosen. Pt to transport via [facility] RN will call report to [phone]. ? ? ?Final next level of care: [place] ?Barriers to Discharge: No Barriers Identified ? ?  ? ?Discharge Placement ?PASRR number recieved: 02/09/22 ?           ?Patient chooses bed at: [facility] [place] ?Patient to be transferred to facility by: [facility] ?Name of family member notified: Daughter Cinda Quest ?Patient and family notified of of transfer: 02/16/22 ? ?Discharge Plan and Services ?  ?Discharge Planning Services: CM Consult ?           ?  ?  ?  ?  ?  ?  ?  ?  ?  ?  ? ?Social Determinants of Health (SDOH) Interventions ?  ? ? ?Readmission Risk Interventions ? ?  02/15/2022  ?  2:21 PM 02/13/2022  ? 10:21 AM 09/12/2021  ?  3:44 PM  ?Readmission Risk Prevention Plan  ?Transportation Screening Complete Complete Complete  ?Medication Review Press photographer) Complete Complete Complete  ?PCP or Specialist appointment within 3-5 days of discharge Complete Complete Complete  ?[place] or Home Care Consult Complete Complete Complete  ?SW Recovery Care/Counseling Consult Complete Complete Complete  ?Palliative Care Screening Complete Complete Not Applicable  ?Skilled Nursing Facility Complete Complete Complete  ? ? ? ? ? ?

## 2022-02-16 NOTE — Discharge Summary (Addendum)
Physician Discharge Summary  ?[name] CWU:[id number] DOB: June 14, 1934 DOA: 02/11/2022 ? ?PCP: [name], MD ? ?Admit date: 02/11/2022 ?Discharge date: 02/16/2022 ? ?Admitted From: Home ?Disposition: SNF ? ?Recommendations for Outpatient Follow-up:  ?Follow up with SNF provider at earliest convenience with repeat CBC/BMP in the next few days ?Follow up in ED if symptoms worsen or new appear ? ? ?Home Health: No ?Equipment/Devices: None ? ?Discharge Condition: Guarded ?CODE STATUS: Full ?Diet recommendation: Heart healthy ? ?Brief/Interim Summary: ?88 y.o. male with a history of heart failure, CAD, CKD stage II, anxiety, GERD, hypertension, OSA, prostate cancer and depression presented secondary to aspiration event and resulting aspiration pneumonia and was admitted with septic shock requiring vasopressor support in ICU.  Subsequently, his condition improved and he was transferred to [facility] service out of ICU.  PT recommended SNF placement.  He has been transitioned to oral oral Augmentin.  He will be discharged to SNF once bed is available on few more days of oral antibiotics. ? ?Discharge Diagnoses:  ? ?Septic shock: Present on admission ?Possible aspiration pneumonia ?-Shock currently resolved.  Sepsis has resolved.  Required vasopressors in ICU.  Blood pressure has significantly improved subsequently. ?-Blood cultures negative so far.  Urine culture grew Enterobacter cloacae. ?-Initially treated with broad-spectrum antibiotics and subsequently transitioned to Augmentin currently.  Discharge on 2 more days of oral Augmentin. ? ?Chronic diastolic heart failure ?-Currently compensated.  Continue Lasix and metoprolol.  Outpatient follow-up with cardiology ? ?Chronic hypotension ?-Patient is on midodrine as an outpatient.  Blood pressure currently on the higher side but midodrine is being continued.  Outpatient follow-up with PCP ? ?History of neurogenic bladder ?-Prior history of chronic Foley and Botox ?-Continue  Vibegron. outpatient follow-up with urology. ? ?Stage II pressure injury to left sacrum: Present on admission ?-Continue local wound care ? ?Asymptomatic bacteriuria ?-Complicated by neurogenic bladder history.  Asymptomatic.  Enterobacter cloacae in urine culture ? ?Iron deficiency anemia ?-Hemoglobin stable.  Outpatient follow-up ? ?Rheumatoid arthritis ?-Outpatient follow-up.  Continue methotrexate ? ?Mood disorder ?-Continue home regimen ? ?Hyperlipidemia ?-Continue statin ? ?Peripheral neuropathy ?-Continue gabapentin ? ?Physical deconditioning ?-Will need PT at SNF. ? ?Hypokalemia ?-replace prior to discharge. Outpatient follow-up ?Discharge Instructions ? ?Discharge Instructions   ? ? Diet - low sodium heart healthy   Complete by: As directed ?  ? Increase activity slowly   Complete by: As directed ?  ? No wound care   Complete by: As directed ?  ? ?  ? ?Allergies as of 02/16/2022   ? ?   Reactions  ? Ciprofloxacin Nausea And Vomiting  ? Headache  ? Citalopram Hydrobromide Other (See Comments)  ? Citalopram and Hydrobromide listed as separate allergies on MAR. Unknown reaction  ? Clindamycin Nausea And Vomiting  ? Lorazepam Other (See Comments)  ? Adverse reaction  ? Paroxetine Nausea Only  ? Ramipril Other (See Comments)  ? "unknown"  ? Simvastatin   ? Sulfa Antibiotics Other (See Comments)  ? ?  ? ?  ?Medication List  ?  ? ?STOP taking these medications   ? ?predniSONE 10 MG tablet ?Commonly known as: DELTASONE ?  ? ?  ? ?TAKE these medications   ? ?acetaminophen 500 MG tablet ?Commonly known as: TYLENOL ?Take 500 mg by mouth 3 (three) times daily as needed for mild pain or moderate pain. ?  ?albuterol 108 (90 Base) MCG/ACT inhaler ?Commonly known as: VENTOLIN HFA ?Inhale 2 puffs into the lungs every 6 (six) hours as needed  for wheezing or shortness of breath. ?  ?Align 4 MG Caps ?Take 1 capsule (4 mg total) by mouth daily. ?  ?ALPRAZolam 0.5 MG tablet ?Commonly known as: Duanne Moron ?Take 1 tablet (0.5 mg total)  by mouth at bedtime. ?  ?amoxicillin-clavulanate 875-125 MG tablet ?Commonly known as: AUGMENTIN ?Take 1 tablet by mouth every 12 (twelve) hours for 2 days. ?  ?apixaban 5 MG Tabs tablet ?Commonly known as: ELIQUIS ?Take 1 tablet (5 mg total) by mouth 2 (two) times daily. ?  ?cetirizine 5 MG tablet ?Commonly known as: ZYRTEC ?Take 5 mg by mouth at bedtime. ?  ?chlorhexidine 0.12 % solution ?Commonly known as: PERIDEX ?Use as directed 15 mLs in the mouth or throat daily as needed (oral health). ?  ?diclofenac Sodium 1 % Gel ?Commonly known as: VOLTAREN ?Apply 2 g topically in the morning, at noon, and at bedtime. To right knee ?  ?EPINEPHrine 0.3 mg/0.3 mL Soaj injection ?Commonly known as: EPI-PEN ?Inject 0.3 mg into the muscle as needed for anaphylaxis. ?  ?fluticasone 50 MCG/ACT nasal spray ?Commonly known as: FLONASE ?Place 2 sprays into both nostrils 2 (two) times daily. ?  ?folic acid 1 MG tablet ?Commonly known as: FOLVITE ?Take 1 mg by mouth daily. ?  ?furosemide 40 MG tablet ?Commonly known as: LASIX ?Take 1 tablet (40 mg total) by mouth daily. Skip dose if SBP less than 110 mmHg ?  ?gabapentin 600 MG tablet ?Commonly known as: NEURONTIN ?Take 600 mg by mouth in the morning and at bedtime. ?  ?Gemtesa 75 MG Tabs ?Generic drug: Vibegron ?Take 75 mg by mouth daily. ?  ?hydroxychloroquine 200 MG tablet ?Commonly known as: PLAQUENIL ?Take 200 mg by mouth 2 (two) times daily. ?  ?iron polysaccharides 150 MG capsule ?Commonly known as: NIFEREX ?Take 150 mg by mouth daily. ?  ?melatonin 3 MG Tabs tablet ?Take 6 mg by mouth at bedtime. ?  ?methotrexate 2.5 MG tablet ?Commonly known as: RHEUMATREX ?Take 25 mg by mouth every Wednesday. ?  ?metoprolol succinate 25 MG 24 hr tablet ?Commonly known as: TOPROL-XL ?Take 25 mg by mouth daily. ?  ?midodrine 2.5 MG tablet ?Commonly known as: PROAMATINE ?Take 2.5 mg by mouth 3 (three) times daily with meals. ?What changed: Another medication with the same name was removed.  Continue taking this medication, and follow the directions you see here. ?  ?multivitamin tablet ?Take 1 tablet by mouth daily. ?  ?ondansetron 4 MG disintegrating tablet ?Commonly known as: ZOFRAN-ODT ?Take 4 mg by mouth every 6 (six) hours as needed for nausea or vomiting. ?  ?polyethylene glycol powder 17 GM/SCOOP powder ?Commonly known as: GLYCOLAX/MIRALAX ?Take 17 g by mouth daily as needed for mild constipation. ?  ?pravastatin 20 MG tablet ?Commonly known as: PRAVACHOL ?TAKE 1 TABLET(20 MG) BY MOUTH AT BEDTIME ?What changed: See the new instructions. ?  ?traZODone 50 MG tablet ?Commonly known as: DESYREL ?Take 50 mg by mouth at bedtime. ?  ?triamcinolone ointment 0.1 % ?Commonly known as: KENALOG ?Apply 1 application. topically daily as needed (dermatitis). ?  ?vitamin B-12 1000 MCG tablet ?Commonly known as: CYANOCOBALAMIN ?Take 1,000 mcg by mouth daily. ?  ?vitamin C 500 MG tablet ?Commonly known as: ASCORBIC ACID ?Take 1,000 mg by mouth daily. ?  ?Vitamin D3 50 MCG (2000 UT) Tabs ?Take 2,000 Units by mouth daily. ?  ?Zinc 50 MG Tabs ?Take 50 mg by mouth in the morning and at bedtime. ?  ? ?  ? ? ?Allergies  ?  Allergen Reactions  ? Ciprofloxacin Nausea And Vomiting  ?  Headache  ? Citalopram Hydrobromide Other (See Comments)  ?  Citalopram and Hydrobromide listed as separate allergies on MAR. Unknown reaction  ? Clindamycin Nausea And Vomiting  ? Lorazepam Other (See Comments)  ?  Adverse reaction  ? Paroxetine Nausea Only  ? Ramipril Other (See Comments)  ?  "unknown"  ? Simvastatin   ? Sulfa Antibiotics Other (See Comments)  ? ? ?Consultations: ?PCCM ? ? ?Procedures/Studies: ?Chest 1 View ? ?Result Date: 01/20/2022 ?CLINICAL DATA:  Renal cell cancer, right. Hx heart failure, chf, asthma, htn, cad, chf EXAM: CHEST  1 VIEW COMPARISON:  12/10/2021, 11/16/2021 FINDINGS: Low lung volumes. Heart size within normal limits. Aortic atherosclerosis. Chronically coarsened interstitial markings. Streaky bibasilar  opacities. Small bilateral pleural effusions. No pneumothorax. Severe degenerative changes of both shoulders. IMPRESSION: Small bilateral pleural effusions with associated bibasilar opacities, likely atelecta

## 2022-02-16 NOTE — Progress Notes (Signed)
SATURATION QUALIFICATIONS: (This note is used to comply with regulatory documentation for home oxygen) ? ?Patient Saturations on Room Air at Rest = 100% ? ?Patient Saturations on Room Air while Ambulating = 99% ? ?Patient Saturations on 1 Liters of oxygen while Ambulating = 100% ? ?Please briefly explain why patient needs home oxygen: oxygen saturation has been above  99% ,does not need home oxygen ?

## 2022-02-17 ENCOUNTER — Ambulatory Visit: Payer: Medicare PPO

## 2022-02-17 DIAGNOSIS — M6259 Muscle wasting and atrophy, not elsewhere classified, multiple sites: Secondary | ICD-10-CM | POA: Diagnosis not present

## 2022-02-17 DIAGNOSIS — I482 Chronic atrial fibrillation, unspecified: Secondary | ICD-10-CM | POA: Diagnosis not present

## 2022-02-17 DIAGNOSIS — G8929 Other chronic pain: Secondary | ICD-10-CM | POA: Diagnosis not present

## 2022-02-17 DIAGNOSIS — E785 Hyperlipidemia, unspecified: Secondary | ICD-10-CM | POA: Diagnosis not present

## 2022-02-17 DIAGNOSIS — K219 Gastro-esophageal reflux disease without esophagitis: Secondary | ICD-10-CM | POA: Diagnosis not present

## 2022-02-17 DIAGNOSIS — J189 Pneumonia, unspecified organism: Secondary | ICD-10-CM | POA: Diagnosis not present

## 2022-02-17 DIAGNOSIS — R1312 Dysphagia, oropharyngeal phase: Secondary | ICD-10-CM | POA: Diagnosis not present

## 2022-02-17 DIAGNOSIS — R2681 Unsteadiness on feet: Secondary | ICD-10-CM | POA: Diagnosis not present

## 2022-02-17 DIAGNOSIS — R4789 Other speech disturbances: Secondary | ICD-10-CM | POA: Diagnosis not present

## 2022-02-17 DIAGNOSIS — M6281 Muscle weakness (generalized): Secondary | ICD-10-CM | POA: Diagnosis not present

## 2022-02-17 DIAGNOSIS — F411 Generalized anxiety disorder: Secondary | ICD-10-CM | POA: Diagnosis not present

## 2022-02-17 DIAGNOSIS — I130 Hypertensive heart and chronic kidney disease with heart failure and stage 1 through stage 4 chronic kidney disease, or unspecified chronic kidney disease: Secondary | ICD-10-CM | POA: Diagnosis not present

## 2022-02-17 DIAGNOSIS — Z741 Need for assistance with personal care: Secondary | ICD-10-CM | POA: Diagnosis not present

## 2022-02-21 ENCOUNTER — Telehealth: Payer: Self-pay | Admitting: Internal Medicine

## 2022-02-21 NOTE — Telephone Encounter (Signed)
Pt daughter called stating that pt passed away on 02/21/22. ?

## 2022-02-21 NOTE — Telephone Encounter (Signed)
I spoke to his wife and daughter today to send my condolences ?

## 2022-02-22 ENCOUNTER — Ambulatory Visit: Payer: Medicare PPO | Admitting: Medical

## 2022-02-24 ENCOUNTER — Ambulatory Visit: Payer: Medicare PPO

## 2022-02-26 DEATH — deceased

## 2022-03-03 ENCOUNTER — Ambulatory Visit: Payer: Self-pay

## 2022-03-10 ENCOUNTER — Ambulatory Visit: Payer: Medicare PPO

## 2022-03-17 ENCOUNTER — Ambulatory Visit: Payer: Medicare PPO

## 2022-03-24 ENCOUNTER — Ambulatory Visit: Payer: Medicare PPO

## 2022-03-31 ENCOUNTER — Ambulatory Visit: Payer: Self-pay

## 2022-04-07 ENCOUNTER — Ambulatory Visit: Payer: Medicare PPO

## 2023-02-07 ENCOUNTER — Ambulatory Visit: Payer: Medicare PPO | Admitting: Urology
